# Patient Record
Sex: Male | Born: 1940 | Race: White | Hispanic: No | Marital: Married | Smoking: Former smoker
Health system: Southern US, Community
[De-identification: ages and names within clinical notes are randomized; demographics above are authoritative.]

## PROBLEM LIST (undated history)

## (undated) DIAGNOSIS — R12 Heartburn: Secondary | ICD-10-CM

## (undated) DIAGNOSIS — S46211A Strain of muscle, fascia and tendon of other parts of biceps, right arm, initial encounter: Secondary | ICD-10-CM

## (undated) DIAGNOSIS — M1990 Unspecified osteoarthritis, unspecified site: Secondary | ICD-10-CM

## (undated) DIAGNOSIS — K219 Gastro-esophageal reflux disease without esophagitis: Secondary | ICD-10-CM

## (undated) DIAGNOSIS — I639 Cerebral infarction, unspecified: Secondary | ICD-10-CM

## (undated) DIAGNOSIS — N400 Enlarged prostate without lower urinary tract symptoms: Secondary | ICD-10-CM

## (undated) DIAGNOSIS — D7581 Myelofibrosis: Secondary | ICD-10-CM

## (undated) DIAGNOSIS — C439 Malignant melanoma of skin, unspecified: Secondary | ICD-10-CM

## (undated) DIAGNOSIS — E039 Hypothyroidism, unspecified: Secondary | ICD-10-CM

## (undated) DIAGNOSIS — E785 Hyperlipidemia, unspecified: Secondary | ICD-10-CM

## (undated) DIAGNOSIS — U071 COVID-19: Secondary | ICD-10-CM

## (undated) DIAGNOSIS — Z8669 Personal history of other diseases of the nervous system and sense organs: Secondary | ICD-10-CM

## (undated) DIAGNOSIS — I129 Hypertensive chronic kidney disease with stage 1 through stage 4 chronic kidney disease, or unspecified chronic kidney disease: Secondary | ICD-10-CM

## (undated) DIAGNOSIS — S46811A Strain of other muscles, fascia and tendons at shoulder and upper arm level, right arm, initial encounter: Secondary | ICD-10-CM

## (undated) DIAGNOSIS — Z87442 Personal history of urinary calculi: Secondary | ICD-10-CM

## (undated) DIAGNOSIS — I10 Essential (primary) hypertension: Secondary | ICD-10-CM

## (undated) HISTORY — DX: Hypertensive chronic kidney disease with stage 1 through stage 4 chronic kidney disease, or unspecified chronic kidney disease: I12.9

## (undated) HISTORY — DX: Heartburn: R12

## (undated) HISTORY — DX: Hyperlipidemia, unspecified: E78.5

## (undated) HISTORY — PX: VARICOSE VEIN SURGERY: SHX832

## (undated) HISTORY — PX: COLONOSCOPY: SHX174

## (undated) HISTORY — PX: BACK SURGERY: SHX140

## (undated) HISTORY — DX: Personal history of other diseases of the nervous system and sense organs: Z86.69

## (undated) HISTORY — PX: EYE SURGERY: SHX253

## (undated) HISTORY — DX: Myelofibrosis: D75.81

---

## 2004-04-16 ENCOUNTER — Ambulatory Visit: Payer: Self-pay | Admitting: General Surgery

## 2005-08-09 ENCOUNTER — Ambulatory Visit: Payer: Self-pay | Admitting: Internal Medicine

## 2006-03-23 ENCOUNTER — Ambulatory Visit: Payer: Self-pay | Admitting: Specialist

## 2006-04-11 ENCOUNTER — Ambulatory Visit: Payer: Self-pay | Admitting: Pain Medicine

## 2006-10-03 ENCOUNTER — Ambulatory Visit: Payer: Self-pay | Admitting: Internal Medicine

## 2007-07-25 ENCOUNTER — Ambulatory Visit: Payer: Self-pay | Admitting: Specialist

## 2007-11-05 DIAGNOSIS — I639 Cerebral infarction, unspecified: Secondary | ICD-10-CM

## 2007-11-05 HISTORY — DX: Cerebral infarction, unspecified: I63.9

## 2007-11-19 ENCOUNTER — Emergency Department: Payer: Self-pay | Admitting: Emergency Medicine

## 2007-11-20 ENCOUNTER — Encounter (INDEPENDENT_AMBULATORY_CARE_PROVIDER_SITE_OTHER): Payer: Self-pay | Admitting: Pediatrics

## 2007-11-20 ENCOUNTER — Inpatient Hospital Stay (HOSPITAL_COMMUNITY): Admission: EM | Admit: 2007-11-20 | Discharge: 2007-11-21 | Payer: Self-pay | Admitting: Pediatrics

## 2009-06-06 LAB — HM COLONOSCOPY

## 2009-07-02 ENCOUNTER — Ambulatory Visit: Payer: Self-pay | Admitting: Internal Medicine

## 2010-07-21 ENCOUNTER — Encounter: Payer: Self-pay | Admitting: Vascular Surgery

## 2010-10-19 NOTE — H&P (Signed)
[name], [name]                 ACCOUNT NO.:  [id number]   MEDICAL RECORD NO.:  [id number]          PATIENT TYPE:  INP   LOCATION:  3109                         FACILITY:  [facility]   PHYSICIAN:  [name], M.D.DATE OF BIRTH:  June 03, 1941   DATE OF ADMISSION:  11/20/2007  DATE OF DISCHARGE:                              HISTORY & PHYSICAL   CHIEF COMPLAINT:  Left-sided weakness, clumsiness, and dysarthria.   HISTORY OF PRESENT CONDITION:  The patient is a 70 year old married  gentleman who had worked all day.  He came home, eating pizza drinking 2  mixed drinks.  He around 11 o'clock, he stated that he felt nauseated.  He went to the kitchen and had a syncopal episode.  He fell striking his  head against one of the porcelain knobs on his cabinets.   He was out for brief period of time.  When he awakened, his speech was  slurred.  He had left facial droop.  On arrival to the hospital,  he had  left-sided weakness, clumsiness, and slurred speech.  The ER doctor said  that he was having trouble getting his words out, but I suspect that  just was dysarthria.  He had no visual problems and no sensory problems.   NIH stroke scale was estimated 14.  Going over carefully, I suspect that  was closer to 11.   The patient's alcohol level was 128.  Calls were made to the [facility] which  had no beds and [facility] which advised not to give him tPA.  Even  though, he otherwise is a candidate because of his alcohol use in the  fall that he had.   I was called and discussed this with the ER doctor.  He stated that  there was no evidence of bruising on the patient's head.  There was some  concern about the alcohol.  Given the patient had focal neurologic  deficits rather than generalized, I felt that tPA was warranted for what  appeared to be a right posterior limb of the internal capsule or  possibly brainstem stroke.   TPA, the patient was last known normal approximately 2300 hours at  615.  TPA was a given as a bolus at 1:55 hours on November 20, 2007.  The patient  was then given the rest of the drip and was transported by [facility] to  [facility].  He arrived around 3:46.  I began to examine him  around 3:50.  I reviewed the CT scan prior to that and found evidence of  low density area that was remote in the left posterior temporal region  suggesting a possible prior stroke.  However, there is no evidence of  acute stroke.  There is no evidence of hemorrhage either into the brain  or into the scalp.   PAST MEDICAL HISTORY:  Remarkable for hypertension, gastritis, or peptic  ulcer disease.  He has had lumbar spondylosis and varicose veins.   PAST SURGICAL HISTORY:  Lumbar laminectomy and veins dripping.   CURRENT MEDICATIONS:  Aspirin, Azor, (? spelling), Protonix, and  multivitamin.  DRUG ALLERGIES:  None known.   FAMILY HISTORY:  Mother died of an intestinal obstruction at age 15.  Father died in his 63s of myocardial infarction.   SOCIAL HISTORY:  The patient spent several years in [employer] in  learning various skills and is a Administrator, Civil Service, ventilation,  air-conditioning and an Midwife.  He is married.  He quit smoking  years ago.  Started smoking in his teens and never smoked more than  about a half pack per day.  He occasionally sneaks one.  He drinks  alcohol several times a week.  He does not use drugs.   REVIEW OF SYSTEMS:  NEUROLOGIC:  Remarkable for sudden onset of  neurologic symptoms as described above.  CARDIOVASCULAR: Positive for  hypertension.  No myocardial infarction.  Pulmonary: No COPD.  GI:  Peptic ulcer disease versus gastritis.  GU: Negative.  MUSCULOSKELETAL:  Lumbar and small joint osteoarthritis.  ENDOCRINE:  Negative.  REPRODUCTIVE:  Negative.  HEMATOLOGIC AND LYMPHATIC:  No anemia,  bruisability.  ALLERGIC/IMMUNOLOGIC:  Negative.  NEUROPSYCHIATRIC:  No  depression.   PHYSICAL EXAMINATION:  VITAL SIGNS:   Temperature 93, blood pressure  133/79, resting pulse 73, respirations 15, oxygen saturation 100%,  height 5 feet 11 inches, and weight 200 pounds.  HEAD, EYES, EARS, NOSE AND THROAT: No signs of infection.  NECK: No bruits.  LUNGS: Clear.  HEART: No murmurs.  Regular rhythm.  ABDOMEN: Bowel sounds normal.  Protuberant hepatosplenomegaly.  SKIN:  Normal.  NEUROLOGIC:  The patient is awake and alert without dysphasia,  dyspraxia.  Cranial Nerves:  Round and reactive pupils.  Visual fields  full.  Extraocular movements full and conjugate.  Fundi normal. Left  central seventh midline tongue and uvula.  Air conduction greater than  bone conduction.  Motor examination normal strength, tone, and mass.  Good fine motor movements.  No drift.  The fine motor movements is  limited only by his osteoarthritis.  Sensation intact to primary  modalities, also to double simultaneous stimuli.  Primary modalities  included pinprick, proprioception, and vibration.  He had good  stereognosis.  Cerebellar:  Good finger-to-nose rapid movements.  Gait  was not tested.  Deep tendon reflexes were slightly more prominent on  the left side the left than the right.  He had bilateral flexor plantar  responses.   IMPRESSION:  1. Non hemorrhagic infarction, right brain, the mechanism is unknown      but the sudden onset suggest to me that was embolic 434.11.  2. Hypertension 404.10.  3. Alcohol abuse.  4. S/P tPA administration with exscellent response.   PLAN:  2D echocardiogram, carotid Doppler, transcranial Doppler, MRI,  MRA intracranial, laboratories including hemoglobin A1c, fasting lipid  panel, and serum homocysteine on November 21, 2007.  We will place him on  aspirin at 0300 hours.  His NIH stroke scale at this time is 1.  He has  made an excellent recovery.  He has passed his swallowing screen, will  be started on heart-healthy diet.  We will obtain his medications from  home and given to him.  Discuss  this with the patient and his family,  also nursing.  S/p      Christian Artis. Sharene Skeans, M.D.  Electronically Signed    [facility]/MEDQ  D:  11/20/2007  T:  11/20/2007  Job:  [id number]

## 2010-10-19 NOTE — Discharge Summary (Signed)
NAMEPURL, CLAYTOR                 ACCOUNT NO.:  [id number]   MEDICAL RECORD NO.:  [id number]          PATIENT TYPE:  INP   LOCATION:  3109                         FACILITY:  [facility]   PHYSICIAN:  Pramod P. Pearlean Brownie, MD    DATE OF BIRTH:  Apr 11, 1941   DATE OF ADMISSION:  11/20/2007  DATE OF DISCHARGE:  11/21/2007                               DISCHARGE SUMMARY   DIAGNOSES AT TIME OF DISCHARGE:  1. Right corona radiata infarct, positive on FLAIR, driving while      intoxicated negative, hyperdensity does correspond with left      hemiparesis and slurred stage.  The patient is status post full-      dose intravenous tissue plasminogen activator, accepted this      treatmentship from .  2. Hypertension.  3. Sporadic alcohol use.  4. Lumbar laminectomy.  5. History of vein stripping.   MEDICATIONS AT TIME OF DISCHARGE:  1. Hyzaar 5/20 mg a day.  2. Flomax 0.4 mg nightly.  3. Protonix 40 mg a day.  4. Multivitamin 1 a day.  5. Ferro-Sequels 1 a day.  6. Plavix 75 mg a day.   STUDIES PERFORMED:  1. CT of the brain at outside hospital showed no acute abnormality.  2. MRI of the brain shows negative DWI.  There is a hyperdensity and      corona radiata on the right on the FLAIR segment.  3. MRA of the brain shows mild intracranial atherosclerotic-type      changes.  4. Followup CT after 24 hours after tPA shows no acute abnormality.  5. Carotid Doppler shows no ICA stenosis.  6. 2D echocardiogram shows EF of 55%-65% with no obvious source of      embolus.  7. Transcranial Dopplers ordered.  No mention of completion in chart.  8. EKG not present in chart from [facility].  9. EKG from __________ shows sinus rhythm with first-degree AV block.   LABORATORY STUDIES:  Cholesterol 154, triglycerides 314, HDL 24, and LDL  87.  Hemoglobin A1c drawn, results are pending.  Homocysteine drawn,  results are pending.  Laboratory studies at a outside hospital shows  chemistry with  creatinine of 0.74, potassium 3.4, chloride 108, and  calcium 8.3, otherwise normal.  His alcohol level was 128.  CBC with  hemoglobin 12.0, hematocrit 34.3, white blood cells 3.79, and platelets  of 453.  INR is 1.0.   HISTORY OF PRESENT ILLNESS:  [name] is a 70 year old right-  handed Caucasian male who had worked all day.  He came home, ate pizza,  and drank 2 mixed drinks.  Around 11 p.m., he states he felt nauseated.  He went to the kitchen and had syncopal episode.  He fell striking his  head against the porcelain from his cabinet.  He was out for a brief  period of time.  When he awakens, his speech was slurred and had left  fascial droop.  On arrival to the hospital, he had left-sided weakness,  clumsiness, and slurred speech.  The ER doctor said that he was having  trouble getting his words out and suspect that he was just dysarthric.  He had no visual problems and no sensory problems.  NIH stroke scale at  [facility] was estimated to be 14.  His alcohol level was 128.  Calls were  made to [facility] which had no dibs and [facility], which advised not to  give tPA.  I called Dr. Sharene Skeans and discussed their opinion.  There was  no evidence of bruising on the patient.  He was recently intoxicated.  He had psychoneurologic deficit other than general and Dr. Sharene Skeans  thought tPA was warranted for right posterior limb of internal capsule  or possibly right stem stroke.  The patient was last known to be normal  at 11 p.m. on November 19, 2007.  He was given tPA at [facility] and  transported back here to [facility].  He was admitted to the  neuro intensive care for further evaluation.   HOSPITAL COURSE:  MRI had a negative DWI, however, FLAIR was positive  for right corona radiata infarct that is corresponding to his stroke  symptoms of left hemiparesis and slurred speech.  He was found to have  vascular risk factors of hypertension related to alcohol.  His NIH   stroke scale while in hospital was reported at 9, most likely a 11 down  to 1 at the time of discharge.  He has no outpatient speech or  occupational needs, but we will get physical therapy.  He is asked to  followup with his primary care physician and to followup risk factors.  Blood pressure has been under good control in the hospital.  The patient  was placed on aspirin for secondary stroke prevention.  He has been  advised to decrease alcohol use and quit smoking.  He will have  outpatient physical therapy and followup as an outpatient with Dr.  Pearlean Brownie.   CONDITION AT DISCHARGE:  The patient is alert and oriented x3.  No  aphasia.  No dysarthria.  Eye movements are full.  Has left facial  weakness.  Tongue is midline.  No drift in his arm, though he does have  decreased fine motor movement on his right arm compared to his left, he  has minimal left hip flexor weakness.  He has NIH stroke scale of 1.   DISCHARGE PLAN:  1. Discharge home with family.  2. Plavix for secondary stroke prevention.  3. Decrease alcohol use to no more than 3 drinks a day.  4. Outpatient occupational therapy.  5. Stop smoking.  6. Follow up with Dr. Delia Heady in 2-3 months.  7. Followup primary care physician in 1-2 weeks.      [name], N.P.    ______________________________  Sunny Schlein. Pearlean Brownie, MD    SB/MEDQ  D:  11/21/2007  T:  11/22/2007  Job:  [id number]   cc:   Wilmon Arms. Tsuei, M.D.  Dr. Delmer Islam

## 2011-03-03 LAB — LIPID PANEL
Cholesterol: 154
HDL: 24 — ABNORMAL LOW
LDL Cholesterol: 87
Total CHOL/HDL Ratio: 6.4
Triglycerides: 214 — ABNORMAL HIGH
VLDL: 43 — ABNORMAL HIGH

## 2011-03-03 LAB — HOMOCYSTEINE: Homocysteine: 7.9

## 2011-03-03 LAB — HEMOGLOBIN A1C
Hgb A1c MFr Bld: 5.9
Mean Plasma Glucose: 133

## 2011-08-08 ENCOUNTER — Encounter (INDEPENDENT_AMBULATORY_CARE_PROVIDER_SITE_OTHER): Payer: Medicare Other | Admitting: Ophthalmology

## 2011-08-08 DIAGNOSIS — H35419 Lattice degeneration of retina, unspecified eye: Secondary | ICD-10-CM

## 2011-08-08 DIAGNOSIS — H33009 Unspecified retinal detachment with retinal break, unspecified eye: Secondary | ICD-10-CM

## 2011-08-08 DIAGNOSIS — H43819 Vitreous degeneration, unspecified eye: Secondary | ICD-10-CM

## 2011-08-08 DIAGNOSIS — H33029 Retinal detachment with multiple breaks, unspecified eye: Secondary | ICD-10-CM | POA: Diagnosis not present

## 2011-08-08 DIAGNOSIS — H2510 Age-related nuclear cataract, unspecified eye: Secondary | ICD-10-CM

## 2011-08-08 DIAGNOSIS — H33001 Unspecified retinal detachment with retinal break, right eye: Secondary | ICD-10-CM | POA: Insufficient documentation

## 2011-08-08 DIAGNOSIS — H33309 Unspecified retinal break, unspecified eye: Secondary | ICD-10-CM

## 2011-08-08 NOTE — H&P (Signed)
[name] is an 71 y.o. male.   Chief Complaint: Loss of vision Right Eye HPI: 2 day history of vision loss right eye  PMH:  CVA 2009.  Negative for MI, Hep, HIV  No past surgical history on file.  No family history on file. Social History:  does not have a smoking history on file. He does not have any smokeless tobacco history on file. His alcohol and drug histories not on file.  Allergies: Allergies not on file  No current outpatient prescriptions on file as of 08/08/2011.   No current facility-administered medications on file as of 08/08/2011.    Review of systems otherwise negative  There were no vitals taken for this visit.  Physical exam: Mental status: oriented x3. Eyes: See eye exam associated with this surgery date and scanned in by scanning center Ears, Nose, Throat: within normal limits Neck: Within Normal limits General: within normal limits Chest: Within normal limits Breast: deferred Heart: Within normal limits Abdomen: Within normal limits GU: deferred Extremities: within normal limits Skin: within normal limits  Assessment/Plan Rhegmatogenous Retinal detachment Right eye Plan: To [facility] for Scleral buckle, laser and gas injection on Aug 11, 2011.  Sherrie George 08/08/2011, 5:23 PM

## 2011-08-10 ENCOUNTER — Encounter (HOSPITAL_COMMUNITY): Payer: Self-pay

## 2011-08-10 MED ORDER — PHENYLEPHRINE HCL 2.5 % OP SOLN
1.0000 [drp] | OPHTHALMIC | Status: AC | PRN
  Administered 2011-08-11 (×3): 1 [drp] via OPHTHALMIC
  Filled 2011-08-10: qty 3

## 2011-08-10 MED ORDER — CYCLOPENTOLATE HCL 1 % OP SOLN
1.0000 [drp] | OPHTHALMIC | Status: AC | PRN
  Administered 2011-08-11 (×3): 1 [drp] via OPHTHALMIC
  Filled 2011-08-10: qty 2

## 2011-08-10 MED ORDER — TROPICAMIDE 1 % OP SOLN
1.0000 [drp] | OPHTHALMIC | Status: AC | PRN
  Administered 2011-08-11 (×3): 1 [drp] via OPHTHALMIC
  Filled 2011-08-10: qty 3

## 2011-08-10 MED ORDER — CEFAZOLIN SODIUM-DEXTROSE 2-3 GM-% IV SOLR
2.0000 g | INTRAVENOUS | Status: AC
  Administered 2011-08-11: 2 g via INTRAVENOUS
  Filled 2011-08-10: qty 50

## 2011-08-10 MED ORDER — GATIFLOXACIN 0.5 % OP SOLN
1.0000 [drp] | OPHTHALMIC | Status: AC | PRN
  Administered 2011-08-11 (×3): 1 [drp] via OPHTHALMIC
  Filled 2011-08-10: qty 2.5

## 2011-08-11 ENCOUNTER — Other Ambulatory Visit (HOSPITAL_COMMUNITY): Payer: Self-pay | Admitting: Ophthalmology

## 2011-08-11 ENCOUNTER — Encounter (HOSPITAL_COMMUNITY)
Admission: RE | Disposition: A | Discharge status: Home / Self Care | Payer: Self-pay | Source: Ambulatory Visit | Attending: Ophthalmology

## 2011-08-11 ENCOUNTER — Ambulatory Visit (HOSPITAL_COMMUNITY): Payer: Medicare Other

## 2011-08-11 ENCOUNTER — Ambulatory Visit (HOSPITAL_COMMUNITY): Payer: Medicare Other | Admitting: Anesthesiology

## 2011-08-11 ENCOUNTER — Ambulatory Visit (HOSPITAL_COMMUNITY)
Admission: RE | Admit: 2011-08-11 | Discharge: 2011-08-12 | Disposition: A | Discharge status: Home / Self Care | Payer: Medicare Other | Source: Ambulatory Visit | Attending: Ophthalmology | Admitting: Ophthalmology

## 2011-08-11 ENCOUNTER — Encounter (HOSPITAL_COMMUNITY): Payer: Self-pay | Admitting: Anesthesiology

## 2011-08-11 ENCOUNTER — Encounter (HOSPITAL_COMMUNITY): Payer: Self-pay | Admitting: *Deleted

## 2011-08-11 ENCOUNTER — Other Ambulatory Visit: Payer: Self-pay

## 2011-08-11 DIAGNOSIS — Z23 Encounter for immunization: Secondary | ICD-10-CM | POA: Diagnosis not present

## 2011-08-11 DIAGNOSIS — Z01811 Encounter for preprocedural respiratory examination: Secondary | ICD-10-CM | POA: Diagnosis not present

## 2011-08-11 DIAGNOSIS — K219 Gastro-esophageal reflux disease without esophagitis: Secondary | ICD-10-CM | POA: Diagnosis not present

## 2011-08-11 DIAGNOSIS — H33009 Unspecified retinal detachment with retinal break, unspecified eye: Secondary | ICD-10-CM | POA: Insufficient documentation

## 2011-08-11 DIAGNOSIS — I10 Essential (primary) hypertension: Secondary | ICD-10-CM | POA: Diagnosis not present

## 2011-08-11 DIAGNOSIS — H33001 Unspecified retinal detachment with retinal break, right eye: Secondary | ICD-10-CM

## 2011-08-11 HISTORY — DX: Unspecified osteoarthritis, unspecified site: M19.90

## 2011-08-11 HISTORY — DX: Benign prostatic hyperplasia without lower urinary tract symptoms: N40.0

## 2011-08-11 HISTORY — PX: SCLERAL BUCKLE: SHX5340

## 2011-08-11 HISTORY — DX: Cerebral infarction, unspecified: I63.9

## 2011-08-11 HISTORY — PX: GAS INSERTION: SHX5336

## 2011-08-11 HISTORY — DX: Essential (primary) hypertension: I10

## 2011-08-11 HISTORY — DX: Gastro-esophageal reflux disease without esophagitis: K21.9

## 2011-08-11 LAB — CBC
HCT: 31.7 % — ABNORMAL LOW (ref 39.0–52.0)
Hemoglobin: 10.6 g/dL — ABNORMAL LOW (ref 13.0–17.0)
MCH: 29.7 pg (ref 26.0–34.0)
MCHC: 33.4 g/dL (ref 30.0–36.0)
MCV: 88.8 fL (ref 78.0–100.0)
Platelets: 294 10*3/uL (ref 150–400)
RBC: 3.57 MIL/uL — ABNORMAL LOW (ref 4.22–5.81)
RDW: 17.2 % — ABNORMAL HIGH (ref 11.5–15.5)
WBC: 3.2 10*3/uL — ABNORMAL LOW (ref 4.0–10.5)

## 2011-08-11 LAB — BASIC METABOLIC PANEL
BUN: 19 mg/dL (ref 6–23)
CO2: 24 mEq/L (ref 19–32)
Calcium: 9.1 mg/dL (ref 8.4–10.5)
Chloride: 110 mEq/L (ref 96–112)
Creatinine, Ser: 0.89 mg/dL (ref 0.50–1.35)
GFR calc Af Amer: >90 mL/min (ref >90)
GFR calc non Af Amer: 84 mL/min — ABNORMAL LOW (ref >90)
Glucose, Bld: 109 mg/dL — ABNORMAL HIGH (ref 70–99)
Potassium: 5.1 mEq/L (ref 3.5–5.1)
Sodium: 145 mEq/L (ref 135–145)

## 2011-08-11 LAB — SURGICAL PCR SCREEN
MRSA, PCR: NEGATIVE
Staphylococcus aureus: NEGATIVE

## 2011-08-11 SURGERY — SCLERAL BUCKLE
Anesthesia: General | Site: Eye | Laterality: Right | Wound class: Clean

## 2011-08-11 MED ORDER — TEMAZEPAM 15 MG PO CAPS: 15.0000 mg | ORAL_CAPSULE | Freq: Every evening | ORAL | Status: DC | PRN

## 2011-08-11 MED ORDER — DEXAMETHASONE SODIUM PHOSPHATE 10 MG/ML IJ SOLN
INTRAMUSCULAR | Status: DC | PRN
  Administered 2011-08-11: 10 mg

## 2011-08-11 MED ORDER — SODIUM CHLORIDE 0.9 % IJ SOLN
INTRAMUSCULAR | Status: DC | PRN
  Administered 2011-08-11: 15:00:00

## 2011-08-11 MED ORDER — PREDNISOLONE ACETATE 1 % OP SUSP
1.0000 [drp] | Freq: Four times a day (QID) | OPHTHALMIC | Status: DC
  Filled 2011-08-11: qty 1

## 2011-08-11 MED ORDER — LACTATED RINGERS IV SOLN: INTRAVENOUS | Status: DC

## 2011-08-11 MED ORDER — PHENYLEPHRINE HCL 10 MG/ML IJ SOLN
INTRAMUSCULAR | Status: DC | PRN
  Administered 2011-08-11 (×2): 40 ug via INTRAVENOUS

## 2011-08-11 MED ORDER — BACITRACIN-POLYMYXIN B 500-10000 UNIT/GM OP OINT
TOPICAL_OINTMENT | OPHTHALMIC | Status: DC | PRN
  Administered 2011-08-11: 1 via OPHTHALMIC

## 2011-08-11 MED ORDER — GLYCOPYRROLATE 0.2 MG/ML IJ SOLN
INTRAMUSCULAR | Status: DC | PRN
  Administered 2011-08-11: 0.6 mg via INTRAVENOUS

## 2011-08-11 MED ORDER — HYDROMORPHONE HCL PF 1 MG/ML IJ SOLN
0.2500 mg | INTRAMUSCULAR | Status: DC | PRN
  Administered 2011-08-11 (×2): 0.5 mg via INTRAVENOUS

## 2011-08-11 MED ORDER — ROCURONIUM BROMIDE 100 MG/10ML IV SOLN
INTRAVENOUS | Status: DC | PRN
  Administered 2011-08-11: 50 mg via INTRAVENOUS

## 2011-08-11 MED ORDER — ATROPINE SULFATE 1 % OP SOLN
OPHTHALMIC | Status: DC | PRN
  Administered 2011-08-11: 1 [drp] via OPHTHALMIC

## 2011-08-11 MED ORDER — TETRACAINE HCL 0.5 % OP SOLN
2.0000 [drp] | Freq: Once | OPHTHALMIC | Status: DC
  Filled 2011-08-11: qty 2

## 2011-08-11 MED ORDER — LIDOCAINE HCL 4 % MT SOLN
OROMUCOSAL | Status: DC | PRN
  Administered 2011-08-11: 4 mL via TOPICAL

## 2011-08-11 MED ORDER — PROPOFOL 10 MG/ML IV EMUL
INTRAVENOUS | Status: DC | PRN
  Administered 2011-08-11: 200 mg via INTRAVENOUS

## 2011-08-11 MED ORDER — SODIUM CHLORIDE 0.45 % IV SOLN
INTRAVENOUS | Status: DC
  Administered 2011-08-11: 20:00:00 via INTRAVENOUS

## 2011-08-11 MED ORDER — ACETAZOLAMIDE SODIUM 500 MG IJ SOLR
500.0000 mg | Freq: Once | INTRAMUSCULAR | Status: AC
  Administered 2011-08-12: 500 mg via INTRAVENOUS
  Filled 2011-08-11: qty 500

## 2011-08-11 MED ORDER — ONDANSETRON HCL 4 MG/2ML IJ SOLN
4.0000 mg | Freq: Four times a day (QID) | INTRAMUSCULAR | Status: DC | PRN
  Administered 2011-08-11: 4 mg via INTRAVENOUS

## 2011-08-11 MED ORDER — GATIFLOXACIN 0.5 % OP SOLN
1.0000 [drp] | Freq: Four times a day (QID) | OPHTHALMIC | Status: DC
  Filled 2011-08-11: qty 2.5

## 2011-08-11 MED ORDER — SODIUM CHLORIDE 0.9 % IV SOLN
INTRAVENOUS | Status: DC
  Administered 2011-08-11: 14:00:00 via INTRAVENOUS

## 2011-08-11 MED ORDER — ONDANSETRON HCL 4 MG/2ML IJ SOLN
INTRAMUSCULAR | Status: AC
  Filled 2011-08-11: qty 2

## 2011-08-11 MED ORDER — EPHEDRINE SULFATE 50 MG/ML IJ SOLN
INTRAMUSCULAR | Status: DC | PRN
  Administered 2011-08-11: 5 mg via INTRAVENOUS

## 2011-08-11 MED ORDER — OXYCODONE-ACETAMINOPHEN 5-325 MG PO TABS
1.0000 | ORAL_TABLET | ORAL | Status: DC | PRN
Start: 2011-08-11 — End: 2011-08-12

## 2011-08-11 MED ORDER — BRIMONIDINE TARTRATE 0.2 % OP SOLN
1.0000 [drp] | Freq: Two times a day (BID) | OPHTHALMIC | Status: DC
  Filled 2011-08-11: qty 5

## 2011-08-11 MED ORDER — MAGNESIUM HYDROXIDE 400 MG/5ML PO SUSP: 15.0000 mL | Freq: Four times a day (QID) | ORAL | Status: DC | PRN

## 2011-08-11 MED ORDER — NEOSTIGMINE METHYLSULFATE 1 MG/ML IJ SOLN
INTRAMUSCULAR | Status: DC | PRN
  Administered 2011-08-11: 3 mg via INTRAVENOUS

## 2011-08-11 MED ORDER — SODIUM CHLORIDE 0.9 % IV SOLN
INTRAVENOUS | Status: DC | PRN
  Administered 2011-08-11 (×2): via INTRAVENOUS

## 2011-08-11 MED ORDER — DROPERIDOL 2.5 MG/ML IJ SOLN: 0.6250 mg | INTRAMUSCULAR | Status: DC | PRN

## 2011-08-11 MED ORDER — ONDANSETRON HCL 4 MG/2ML IJ SOLN
INTRAMUSCULAR | Status: DC | PRN
  Administered 2011-08-11: 4 mg via INTRAVENOUS

## 2011-08-11 MED ORDER — BACITRACIN-POLYMYXIN B 500-10000 UNIT/GM OP OINT: TOPICAL_OINTMENT | Freq: Two times a day (BID) | OPHTHALMIC | Status: AC

## 2011-08-11 MED ORDER — HEMOSTATIC AGENTS (NO CHARGE) OPTIME
TOPICAL | Status: DC | PRN
  Administered 2011-08-11: 1 via TOPICAL

## 2011-08-11 MED ORDER — MORPHINE SULFATE 2 MG/ML IJ SOLN
1.0000 mg | INTRAMUSCULAR | Status: DC | PRN
  Administered 2011-08-11: 1 mg via INTRAVENOUS
  Filled 2011-08-11: qty 1

## 2011-08-11 MED ORDER — FENTANYL CITRATE 0.05 MG/ML IJ SOLN
INTRAMUSCULAR | Status: DC | PRN
  Administered 2011-08-11: 50 ug via INTRAVENOUS
  Administered 2011-08-11: 100 ug via INTRAVENOUS

## 2011-08-11 MED ORDER — BUPIVACAINE HCL 0.75 % IJ SOLN
INTRAMUSCULAR | Status: DC | PRN
  Administered 2011-08-11: 10 mL

## 2011-08-11 MED ORDER — MIDAZOLAM HCL 5 MG/5ML IJ SOLN
INTRAMUSCULAR | Status: DC | PRN
  Administered 2011-08-11: 1 mg via INTRAVENOUS

## 2011-08-11 MED ORDER — BSS IO SOLN
INTRAOCULAR | Status: DC | PRN
  Administered 2011-08-11: 15 mL via INTRAOCULAR

## 2011-08-11 MED ORDER — MUPIROCIN 2 % EX OINT
TOPICAL_OINTMENT | CUTANEOUS | Status: AC
  Administered 2011-08-11: 1 via NASAL
  Filled 2011-08-11: qty 22

## 2011-08-11 MED ORDER — LATANOPROST 0.005 % OP SOLN
1.0000 [drp] | Freq: Every day | OPHTHALMIC | Status: DC
  Filled 2011-08-11: qty 2.5

## 2011-08-11 MED ORDER — ACETAMINOPHEN 325 MG PO TABS
325.0000 mg | ORAL_TABLET | ORAL | Status: DC | PRN
  Administered 2011-08-12: 650 mg via ORAL
  Filled 2011-08-11: qty 2

## 2011-08-11 SURGICAL SUPPLY — 70 items
APPLICATOR DR MATTHEWS STRL (MISCELLANEOUS) ×12 IMPLANT
BAND SCLERAL BUCKLING TYPE 240 (Ophthalmic Related) ×2 IMPLANT
BLADE EYE CATARACT 19 1.4 BEAV (BLADE) ×2 IMPLANT
BLADE MVR KNIFE 19G (BLADE) IMPLANT
BLADE SURG 15 STRL LF DISP TIS (BLADE) IMPLANT
BLADE SURG 15 STRL SS (BLADE)
CANNULA ANT CHAM MAIN (OPHTHALMIC RELATED) IMPLANT
CANNULA DUAL BORE 23G (CANNULA) IMPLANT
CORDS BIPOLAR (ELECTRODE) IMPLANT
COTTONBALL LRG STERILE PKG (GAUZE/BANDAGES/DRESSINGS) ×6 IMPLANT
COVER SURGICAL LIGHT HANDLE (MISCELLANEOUS) ×2 IMPLANT
DRAPE OPHTHALMIC 77X100 STRL (CUSTOM PROCEDURE TRAY) ×2 IMPLANT
ERASER HMR WETFIELD 23G BP (MISCELLANEOUS) IMPLANT
EYE SHIELD UNIVERSAL CLEAR (GAUZE/BANDAGES/DRESSINGS) ×2 IMPLANT
FILTER BLUE MILLIPORE (MISCELLANEOUS) ×4 IMPLANT
FILTER STRAW FLUID ASPIR (MISCELLANEOUS) IMPLANT
GAS OPHTHALMIC (MISCELLANEOUS) ×2 IMPLANT
GLOVE SS BIOGEL STRL SZ 6.5 (GLOVE) ×2 IMPLANT
GLOVE SS BIOGEL STRL SZ 7 (GLOVE) ×1 IMPLANT
GLOVE SUPERSENSE BIOGEL SZ 6.5 (GLOVE) ×2
GLOVE SUPERSENSE BIOGEL SZ 7 (GLOVE) ×1
GLOVE SURG 8.5 LATEX PF (GLOVE) ×2 IMPLANT
GLOVE SURG SS PI 6.5 STRL IVOR (GLOVE) ×4 IMPLANT
GOWN STRL NON-REIN LRG LVL3 (GOWN DISPOSABLE) ×8 IMPLANT
ILLUMINATOR CHOW PICK 25GA (MISCELLANEOUS) IMPLANT
KIT PERFLUORON PROCEDURE 5ML (MISCELLANEOUS) IMPLANT
KIT ROOM TURNOVER OR (KITS) ×2 IMPLANT
KNIFE CRESCENT 1.75 EDGEAHEAD (BLADE) IMPLANT
KNIFE GRIESHABER SHARP 2.5MM (MISCELLANEOUS) ×8 IMPLANT
MARKER SKIN DUAL TIP RULER LAB (MISCELLANEOUS) IMPLANT
NEEDLE 18GX1X1/2 (RX/OR ONLY) (NEEDLE) ×2 IMPLANT
NEEDLE 25GX 5/8IN NON SAFETY (NEEDLE) IMPLANT
NEEDLE 27GAX1X1/2 (NEEDLE) IMPLANT
NEEDLE HYPO 30X.5 LL (NEEDLE) ×6 IMPLANT
NS IRRIG 1000ML POUR BTL (IV SOLUTION) ×2 IMPLANT
PACK VITRECTOMY CUSTOM (CUSTOM PROCEDURE TRAY) ×2 IMPLANT
PAD ARMBOARD 7.5X6 YLW CONV (MISCELLANEOUS) ×2 IMPLANT
PAD EYE OVAL STERILE LF (GAUZE/BANDAGES/DRESSINGS) ×2 IMPLANT
PAK VITRECTOMY PIK 25 GA (OPHTHALMIC RELATED) IMPLANT
PROBE DIRECTIONAL LASER (MISCELLANEOUS) IMPLANT
REPL STRA BRUSH NEEDLE (NEEDLE) IMPLANT
RESERVOIR BACK FLUSH (MISCELLANEOUS) IMPLANT
ROLLS DENTAL (MISCELLANEOUS) ×4 IMPLANT
SET FLUID INJECTOR (SET/KITS/TRAYS/PACK) IMPLANT
SET VGFI TUBING 8065808002 (SET/KITS/TRAYS/PACK) IMPLANT
SLEEVE SURGEON STRL (DRAPES) ×2 IMPLANT
SPEAR EYE SURG WECK-CEL (MISCELLANEOUS) ×4 IMPLANT
SPONGE GROOVED SILICONE 4X12X8 (Ophthalmic Related) ×2 IMPLANT
SPONGE SURGIFOAM ABS GEL 12-7 (HEMOSTASIS) ×2 IMPLANT
STOPCOCK 4 WAY LG BORE MALE ST (IV SETS) IMPLANT
SUT CHROMIC 7 0 TG140 8 (SUTURE) ×2 IMPLANT
SUT ETHILON 9 0 TG140 8 (SUTURE) IMPLANT
SUT MERSILENE 4 0 RV 2 (SUTURE) ×4 IMPLANT
SUT SILK 2 0 (SUTURE) ×1
SUT SILK 2-0 18XBRD TIE 12 (SUTURE) ×1 IMPLANT
SUT SILK 4 0 RB 1 (SUTURE) ×2 IMPLANT
SUT VICRYL 7 0 TG140 8 (SUTURE) IMPLANT
SYR 20CC LL (SYRINGE) ×2 IMPLANT
SYR 5ML LL (SYRINGE) IMPLANT
SYR BULB 3OZ (MISCELLANEOUS) ×2 IMPLANT
SYR TB 1ML LUER SLIP (SYRINGE) IMPLANT
SYRINGE 10CC LL (SYRINGE) ×2 IMPLANT
TAPE SURG TRANSPORE 1 IN (GAUZE/BANDAGES/DRESSINGS) ×1 IMPLANT
TAPE SURGICAL TRANSPORE 1 IN (GAUZE/BANDAGES/DRESSINGS) ×1
TIRE 11 SCLERAL TYPE 279 (Ophthalmic Related) ×2 IMPLANT
TOWEL OR 17X24 6PK STRL BLUE (TOWEL DISPOSABLE) ×6 IMPLANT
TUBING ART PRESS 12 MALE/MALE (MISCELLANEOUS) IMPLANT
VITREORETINAL VISCODISSEC (MISCELLANEOUS) IMPLANT
WATER STERILE IRR 1000ML POUR (IV SOLUTION) ×2 IMPLANT
WIPE INSTRUMENT VISIWIPE 73X73 (MISCELLANEOUS) ×2 IMPLANT

## 2011-08-11 NOTE — Transfer of Care (Signed)
Immediate Anesthesia Transfer of Care Note  Patient: [name]  Procedure(s) Performed: Procedure(s) (LRB): SCLERAL BUCKLE (Right) DIODE LASER APPLICATION (Right) INSERTION OF GAS (Right)  Patient Location: PACU  Anesthesia Type: General  Level of Consciousness: awake, alert , oriented and patient cooperative  Airway & Oxygen Therapy: Patient Spontanous Breathing and Patient connected to nasal cannula oxygen  Post-op Assessment: Report given to PACU RN, Post -op Vital signs reviewed and stable and Patient moving all extremities X 4  Post vital signs: Reviewed and stable  Complications: No apparent anesthesia complications

## 2011-08-11 NOTE — Brief Op Note (Signed)
Brief Operative note   Preoperative diagnosis:  Rhegmatogenous Retinal detachment right eye Postoperative diagnosis  * No Diagnosis Codes entered *  Procedures: Scleral buckle, laser and gas injection right eye  Surgeon:  Sherrie George, MD...  Assistant:  Rosalie Doctor SA   Anesthesia: General  Specimen: none  Estimated blood loss:  1cc  Complications: none  Patient sent to PACU in good condition  Composed by Sherrie George MD  Dictation number: [phone]

## 2011-08-11 NOTE — Anesthesia Postprocedure Evaluation (Signed)
Anesthesia Post Note  Patient: [name]  Procedure(s) Performed: Procedure(s) (LRB): SCLERAL BUCKLE (Right) DIODE LASER APPLICATION (Right) INSERTION OF GAS (Right)  Anesthesia type: general  Patient location: PACU  Post pain: Pain level controlled  Post assessment: Patient's Cardiovascular Status Stable  Last Vitals: There were no vitals filed for this visit.  Post vital signs: Reviewed and stable  Level of consciousness: sedated  Complications: No apparent anesthesia complications

## 2011-08-11 NOTE — Progress Notes (Signed)
Report to Kay RN as caregiver.  

## 2011-08-11 NOTE — Anesthesia Preprocedure Evaluation (Signed)
Anesthesia Evaluation  Patient identified by MRN, date of birth, ID band Patient awake    Reviewed: Allergy & Precautions, H&P , NPO status , Patient's Chart, lab work & pertinent test results  History of Anesthesia Complications Negative for: history of anesthetic complications  Airway Mallampati: II TM Distance: >3 FB Neck ROM: Full    Dental  (+) Edentulous Upper   Pulmonary neg pulmonary ROS,    Pulmonary exam normal       Cardiovascular hypertension, Pt. on medications     Neuro/Psych CVA, No Residual Symptoms    GI/Hepatic Neg liver ROS, GERD-  Controlled and Medicated,  Endo/Other  negative endocrine ROS  Renal/GU negative Renal ROS     Musculoskeletal   Abdominal   Peds  Hematology   Anesthesia Other Findings   Reproductive/Obstetrics                           Anesthesia Physical Anesthesia Plan  ASA: III  Anesthesia Plan: General   Post-op Pain Management:    Induction: Intravenous  Airway Management Planned: Oral ETT  Additional Equipment:   Intra-op Plan:   Post-operative Plan: Extubation in OR  Informed Consent: I have reviewed the patients History and Physical, chart, labs and discussed the procedure including the risks, benefits and alternatives for the proposed anesthesia with the patient or authorized representative who has indicated his/her understanding and acceptance.   Dental advisory given  Plan Discussed with: Surgeon, Anesthesiologist and CRNA  Anesthesia Plan Comments:         Anesthesia Quick Evaluation

## 2011-08-11 NOTE — Anesthesia Procedure Notes (Signed)
Procedure Name: Intubation Date/Time: 08/11/2011 2:53 PM Performed by: Elizbeth Squires Pre-anesthesia Checklist: Patient identified, Emergency Drugs available, Suction available and Patient being monitored Patient Re-evaluated:Patient Re-evaluated prior to inductionOxygen Delivery Method: Circle system utilized Preoxygenation: Pre-oxygenation with 100% oxygen Intubation Type: IV induction Ventilation: Mask ventilation without difficulty and Oral airway inserted - appropriate to patient size Laryngoscope Size: Mac and 3 Grade View: Grade I Tube type: Oral Tube size: 7.5 mm Number of attempts: 1 Airway Equipment and Method: Stylet Placement Confirmation: ETT inserted through vocal cords under direct vision,  positive ETCO2 and breath sounds checked- equal and bilateral Secured at: 23 cm Tube secured with: Tape Dental Injury: Teeth and Oropharynx as per pre-operative assessment

## 2011-08-11 NOTE — H&P (Signed)
I examined the patient today and there is no change in the medical status 

## 2011-08-12 ENCOUNTER — Encounter (HOSPITAL_COMMUNITY): Payer: Self-pay | Admitting: Ophthalmology

## 2011-08-12 MED ORDER — GATIFLOXACIN 0.5 % OP SOLN: 1.0000 [drp] | Freq: Four times a day (QID) | OPHTHALMIC | Status: DC

## 2011-08-12 MED ORDER — OXYCODONE-ACETAMINOPHEN 5-325 MG PO TABS: 1.0000 | ORAL_TABLET | ORAL | Status: AC | PRN

## 2011-08-12 MED ORDER — PNEUMOCOCCAL VAC POLYVALENT 25 MCG/0.5ML IJ INJ
0.5000 mL | INJECTION | Freq: Every day | INTRAMUSCULAR | Status: AC
  Administered 2011-08-12: 0.5 mL via INTRAMUSCULAR
  Filled 2011-08-12: qty 0.5

## 2011-08-12 MED ORDER — BACITRACIN-POLYMYXIN B 500-10000 UNIT/GM OP OINT: TOPICAL_OINTMENT | Freq: Three times a day (TID) | OPHTHALMIC | Status: DC

## 2011-08-12 MED ORDER — BRIMONIDINE TARTRATE 0.2 % OP SOLN: 1.0000 [drp] | Freq: Two times a day (BID) | OPHTHALMIC | Status: AC

## 2011-08-12 MED ORDER — BACITRACIN-POLYMYXIN B 500-10000 UNIT/GM OP OINT: TOPICAL_OINTMENT | Freq: Three times a day (TID) | OPHTHALMIC | Status: AC

## 2011-08-12 MED ORDER — LATANOPROST 0.005 % OP SOLN: 1.0000 [drp] | Freq: Every day | OPHTHALMIC | Status: AC

## 2011-08-12 MED ORDER — PREDNISOLONE ACETATE 1 % OP SUSP: 1.0000 [drp] | Freq: Four times a day (QID) | OPHTHALMIC | Status: AC

## 2011-08-12 NOTE — Progress Notes (Signed)
Discharge instructions reviewed with pt and pt's wife and prescription given.  Pt has eye bag with medications in room.  Pt and pt's wife verbalized understanding and had no questions.  Pt discharged in stable condition via wheelchair with wife.  Hector Shade [place]

## 2011-08-12 NOTE — Progress Notes (Signed)
08/12/2011, 6:38 AM  Mental Status:  Awake, Alert, Oriented  Anterior segment: Cornea  Clear    Anterior Chamber Clear    Lens:   Cataract  Intra Ocular Pressure 26 mmHg with Tonopen  Vitreous: Clear 30%gas bubble  Retina:  Attached Good laser reaction  Impression: Excellent result Retina attached Final Diagnosis: Active Problems: Rhegmatogenous Retinal detachment   Plan: start post operative eye drops.  Discharge to home.  Give post operative instructions  Sherrie George 08/12/2011, 6:38 AM

## 2011-08-12 NOTE — Op Note (Signed)
NAMEANGAD, NABERS                 ACCOUNT NO.:  [id number]  MEDICAL RECORD NO.:  [id number]  LOCATION:  5127                         FACILITY:  [facility]  PHYSICIAN:  Beulah Gandy. Ashley Royalty, M.D. DATE OF BIRTH:  12-24-1940  DATE OF PROCEDURE:  08/11/2011 DATE OF DISCHARGE:                              OPERATIVE REPORT   ADMISSION DIAGNOSIS:  Rhegmatogenous retinal detachment, right eye.  PROCEDURE:  Scleral buckle, right eye, gas injection, right eye, and panretinal photocoagulation, right eye.  SURGEON:  Beulah Gandy. Ashley Royalty, MD  ASSISTANT:  Rosalie Doctor, SA  ANESTHESIA:  General.  DETAILS:  Usual prep and drape, 360 degree limbal peritomy isolation of 4 rectus muscles on 2-0 silk, localization of break at 12 o'clock. Scleral dissection for 360 degrees to admit a #279 intrascleral implant. Diathermy placed in the bed.  Two sutures per quadrant for a total of 8 scleral sutures were placed in the scleral flaps.  The 279 implant was placed around the globe with a joint at 2 o'clock. A 240 band was placed around the globe with a 270 sleeve at 2 o'clock.  The buckle was adjusted and trimmed.  The band was adjusted and trimmed.  The perforation site was chosen at 10 o'clock in the posterior aspect of the bed.  A large amount of clear colorless subretinal fluid came forth in a controlled manner.  A 508 G radial segment was placed at 12 o'clock beneath the break.  C3F8 0.9-1.0 mL was injected through the pars plana into the vitreous cavity.  The C3F8 was 100%.  Indirect ophthalmoscopy showed the retina to be lying nicely in place on the scleral buckle and the break was well supported.  The indirect ophthalmoscope laser was moved into place, 649 burns were placed around the retinal periphery and around the retinal break.  Power was 500 mw, 1000 microns each, and 0.1 seconds each.  The buckle again was adjusted and trimmed.  The band was adjusted and trimmed.  The conjunctiva was reposited with 7-0  chromic suture.  Polymyxin and gentamicin were irrigated into [place] space and atropine solution was applied.  Marcaine was injected around the globe for postop pain.  Decadron 10 mg was injected into the lower subconjunctival space.  Polysporin ophthalmic ointment, a patch and shield were placed.  Closing pressure was 15 with a Barraquer tonometer. Complications None.  Duration 2 hours.  The patient was awakened and taken to recovery in satisfactory condition.     Beulah Gandy. Ashley Royalty, M.D.     JDM/MEDQ  D:  08/11/2011  T:  08/12/2011  Job:  [id number]

## 2011-08-12 NOTE — Discharge Summary (Signed)
Discharge summary not needed on [facility] patients per medical records. 

## 2011-08-18 ENCOUNTER — Encounter (INDEPENDENT_AMBULATORY_CARE_PROVIDER_SITE_OTHER): Payer: Medicare Other | Admitting: Ophthalmology

## 2011-08-18 DIAGNOSIS — H33309 Unspecified retinal break, unspecified eye: Secondary | ICD-10-CM

## 2011-08-18 DIAGNOSIS — H33009 Unspecified retinal detachment with retinal break, unspecified eye: Secondary | ICD-10-CM

## 2011-09-01 DIAGNOSIS — G245 Blepharospasm: Secondary | ICD-10-CM | POA: Diagnosis not present

## 2011-09-01 DIAGNOSIS — I63239 Cerebral infarction due to unspecified occlusion or stenosis of unspecified carotid arteries: Secondary | ICD-10-CM | POA: Diagnosis not present

## 2011-09-01 DIAGNOSIS — I6340 Cerebral infarction due to embolism of unspecified cerebral artery: Secondary | ICD-10-CM | POA: Diagnosis not present

## 2011-09-06 DIAGNOSIS — I6340 Cerebral infarction due to embolism of unspecified cerebral artery: Secondary | ICD-10-CM | POA: Diagnosis not present

## 2011-09-12 ENCOUNTER — Encounter (INDEPENDENT_AMBULATORY_CARE_PROVIDER_SITE_OTHER): Payer: Medicare Other | Admitting: Ophthalmology

## 2011-09-12 DIAGNOSIS — H33009 Unspecified retinal detachment with retinal break, unspecified eye: Secondary | ICD-10-CM

## 2011-10-20 NOTE — Progress Notes (Signed)
This encounter was created in error - please disregard.

## 2011-11-21 ENCOUNTER — Encounter (INDEPENDENT_AMBULATORY_CARE_PROVIDER_SITE_OTHER): Payer: Medicare Other | Admitting: Ophthalmology

## 2011-11-21 DIAGNOSIS — H43819 Vitreous degeneration, unspecified eye: Secondary | ICD-10-CM | POA: Diagnosis not present

## 2011-11-21 DIAGNOSIS — H33309 Unspecified retinal break, unspecified eye: Secondary | ICD-10-CM | POA: Diagnosis not present

## 2011-11-21 DIAGNOSIS — H2510 Age-related nuclear cataract, unspecified eye: Secondary | ICD-10-CM

## 2011-11-21 DIAGNOSIS — H33009 Unspecified retinal detachment with retinal break, unspecified eye: Secondary | ICD-10-CM | POA: Diagnosis not present

## 2011-11-28 DIAGNOSIS — H2510 Age-related nuclear cataract, unspecified eye: Secondary | ICD-10-CM | POA: Diagnosis not present

## 2011-12-27 DIAGNOSIS — H33009 Unspecified retinal detachment with retinal break, unspecified eye: Secondary | ICD-10-CM | POA: Diagnosis not present

## 2011-12-27 DIAGNOSIS — H2510 Age-related nuclear cataract, unspecified eye: Secondary | ICD-10-CM | POA: Diagnosis not present

## 2012-01-16 DIAGNOSIS — R42 Dizziness and giddiness: Secondary | ICD-10-CM | POA: Diagnosis not present

## 2012-01-16 DIAGNOSIS — I6789 Other cerebrovascular disease: Secondary | ICD-10-CM | POA: Diagnosis not present

## 2012-01-24 DIAGNOSIS — I4891 Unspecified atrial fibrillation: Secondary | ICD-10-CM | POA: Diagnosis not present

## 2012-02-27 DIAGNOSIS — H269 Unspecified cataract: Secondary | ICD-10-CM | POA: Diagnosis not present

## 2012-02-27 DIAGNOSIS — H2510 Age-related nuclear cataract, unspecified eye: Secondary | ICD-10-CM | POA: Diagnosis not present

## 2012-02-28 DIAGNOSIS — H2510 Age-related nuclear cataract, unspecified eye: Secondary | ICD-10-CM | POA: Diagnosis not present

## 2012-03-05 DIAGNOSIS — G245 Blepharospasm: Secondary | ICD-10-CM | POA: Diagnosis not present

## 2012-03-05 DIAGNOSIS — I63239 Cerebral infarction due to unspecified occlusion or stenosis of unspecified carotid arteries: Secondary | ICD-10-CM | POA: Diagnosis not present

## 2012-03-19 DIAGNOSIS — H269 Unspecified cataract: Secondary | ICD-10-CM | POA: Diagnosis not present

## 2012-03-19 DIAGNOSIS — H2510 Age-related nuclear cataract, unspecified eye: Secondary | ICD-10-CM | POA: Diagnosis not present

## 2012-03-20 DIAGNOSIS — Z23 Encounter for immunization: Secondary | ICD-10-CM | POA: Diagnosis not present

## 2012-04-04 DIAGNOSIS — H40059 Ocular hypertension, unspecified eye: Secondary | ICD-10-CM | POA: Diagnosis not present

## 2012-05-15 DIAGNOSIS — I10 Essential (primary) hypertension: Secondary | ICD-10-CM | POA: Diagnosis not present

## 2012-05-15 DIAGNOSIS — I6789 Other cerebrovascular disease: Secondary | ICD-10-CM | POA: Diagnosis not present

## 2012-05-15 DIAGNOSIS — I6990 Unspecified sequelae of unspecified cerebrovascular disease: Secondary | ICD-10-CM | POA: Diagnosis not present

## 2012-05-23 ENCOUNTER — Ambulatory Visit (INDEPENDENT_AMBULATORY_CARE_PROVIDER_SITE_OTHER): Payer: Medicare Other | Admitting: Ophthalmology

## 2012-05-23 DIAGNOSIS — H35039 Hypertensive retinopathy, unspecified eye: Secondary | ICD-10-CM

## 2012-05-23 DIAGNOSIS — H3530 Unspecified macular degeneration: Secondary | ICD-10-CM

## 2012-05-23 DIAGNOSIS — H43819 Vitreous degeneration, unspecified eye: Secondary | ICD-10-CM

## 2012-05-23 DIAGNOSIS — H33009 Unspecified retinal detachment with retinal break, unspecified eye: Secondary | ICD-10-CM

## 2012-05-23 DIAGNOSIS — H33309 Unspecified retinal break, unspecified eye: Secondary | ICD-10-CM

## 2012-05-23 DIAGNOSIS — I10 Essential (primary) hypertension: Secondary | ICD-10-CM

## 2012-08-14 DIAGNOSIS — H9209 Otalgia, unspecified ear: Secondary | ICD-10-CM | POA: Diagnosis not present

## 2012-08-14 DIAGNOSIS — I10 Essential (primary) hypertension: Secondary | ICD-10-CM | POA: Diagnosis not present

## 2012-08-14 DIAGNOSIS — I6789 Other cerebrovascular disease: Secondary | ICD-10-CM | POA: Diagnosis not present

## 2012-09-14 ENCOUNTER — Telehealth: Payer: Self-pay | Admitting: *Deleted

## 2012-09-14 NOTE — Telephone Encounter (Signed)
Have his primary M.D. address this and may be referred back if necessary

## 2012-09-14 NOTE — Telephone Encounter (Signed)
Patient called and stated he is having a lot of pain in his neck. The pain is mainly on the  his right of his neck, goes up right behind his ear without  inner ear pain. Patient states this pain has been going on for three weeks states its a dull pain which comes and goes. Patient has been taking ibuprofen which seems to help. Please  advise

## 2012-09-17 NOTE — Telephone Encounter (Signed)
I have made a [facility] appointment with you patient does not feel safe going to see anyone one else. He states his primary is a cardiologist and does not feel like he can handle his problem.

## 2012-09-18 ENCOUNTER — Ambulatory Visit: Payer: Self-pay | Admitting: Internal Medicine

## 2012-09-18 DIAGNOSIS — M47812 Spondylosis without myelopathy or radiculopathy, cervical region: Secondary | ICD-10-CM | POA: Diagnosis not present

## 2012-09-18 DIAGNOSIS — I6789 Other cerebrovascular disease: Secondary | ICD-10-CM | POA: Diagnosis not present

## 2012-09-18 DIAGNOSIS — I10 Essential (primary) hypertension: Secondary | ICD-10-CM | POA: Diagnosis not present

## 2012-09-18 DIAGNOSIS — S139XXA Sprain of joints and ligaments of unspecified parts of neck, initial encounter: Secondary | ICD-10-CM | POA: Diagnosis not present

## 2012-09-18 DIAGNOSIS — M5030 Other cervical disc degeneration, unspecified cervical region: Secondary | ICD-10-CM | POA: Diagnosis not present

## 2012-09-26 DIAGNOSIS — I10 Essential (primary) hypertension: Secondary | ICD-10-CM | POA: Diagnosis not present

## 2012-09-26 DIAGNOSIS — I6789 Other cerebrovascular disease: Secondary | ICD-10-CM | POA: Diagnosis not present

## 2012-09-26 DIAGNOSIS — M5412 Radiculopathy, cervical region: Secondary | ICD-10-CM | POA: Diagnosis not present

## 2012-09-26 DIAGNOSIS — M4712 Other spondylosis with myelopathy, cervical region: Secondary | ICD-10-CM | POA: Diagnosis not present

## 2012-09-26 NOTE — Telephone Encounter (Signed)
ok 

## 2012-09-27 ENCOUNTER — Inpatient Hospital Stay
Admission: RE | Admit: 2012-09-27 | Discharge: 2012-09-27 | Disposition: A | Discharge status: Home / Self Care | Payer: Self-pay | Source: Ambulatory Visit | Attending: Neurology | Admitting: Neurology

## 2012-09-27 ENCOUNTER — Encounter: Payer: Self-pay | Admitting: Neurology

## 2012-09-27 ENCOUNTER — Other Ambulatory Visit: Payer: Self-pay | Admitting: *Deleted

## 2012-09-27 ENCOUNTER — Ambulatory Visit (INDEPENDENT_AMBULATORY_CARE_PROVIDER_SITE_OTHER): Payer: Medicare Other | Admitting: Neurology

## 2012-09-27 VITALS — BP 124/74 | HR 62 | Temp 97.8°F | Ht 69.0 in | Wt 193.0 lb

## 2012-09-27 DIAGNOSIS — I6990 Unspecified sequelae of unspecified cerebrovascular disease: Secondary | ICD-10-CM

## 2012-09-27 DIAGNOSIS — M542 Cervicalgia: Secondary | ICD-10-CM

## 2012-09-27 NOTE — Patient Instructions (Signed)
He was advised to use local heat application for his neck pain and 2 daily next exercises. Continue to use Aleve up to 3 times a day as needed and try muscle relaxants. Check MRI scan of the neck and stay on Plavix for stroke prevention and maintain strict control of hypertension and lipids. Check followup carotid ultrasound study. Return for followup in 2 months with nurse practitioner.Marland Kitchen

## 2012-09-27 NOTE — Progress Notes (Signed)
[facility] [address] [place]. [place] [address] [phone]       OFFICE CONSULT NOTE  Christian. [name] Date of Birth:  1940-07-24 Medical Record Number:  [id number]   Referring MD:  Corky Downs, MD Reason for Referral:  Neck pain  HPI: [name] is a 72 year old pleasant Caucasian male who is had right-sided neck pain for the last 2 months. He states he woke up one day with right sided posterior neck pain with muscle spasm and tightness. He felt a crick in his neck. He had trouble moving his neck in either direction. He for a few days had pain radiating up behind his ear. He took Mobic initially which did not tolerate due to GI side effects. Leave seems to work better for him and takes the morning and can get by doing his work. During the late afternoon and evening times the pain and neck movements are bothersome. He denies any rectal pain going down his spine on his arm. He denies tingling numbness or weakness in his hands. He denies any gait or balance difficulties or bladder control problems. He had x-ray of the neck done Hawn/15/14 at [facility] which shows multilevel degenerative disc disease with right-sided neuroforaminal narrowing at C5-6. He denies lifting any heavy weights or any recent injury or previous neck problems.  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 Vascular risk factors of hypertension hyperlipidemia. He had an episode of intermittent transient left eye blepharospasm in 2013 which improved without treatment. He has been on Plavix which is tolerating well without bleeding, bruising or other side effects. He states his blood pressure and cholesterol have all been under good control. ROS:   14 system review of systems is positive for hypertension, hearing loss, easy bleeding, joint pain, neck pain, allergies, runny nose. PMH:   Past Medical History  Diagnosis Date  . Hypertension   . Kidney stones     hx of  . Prostate hypertrophy   . GERD (gastroesophageal reflux disease)   . Cancer     hx of melanoma  . Arthritis   . 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 Hyperlipidemia     Social History:  History   Social History  . Marital Status: Married    Spouse Name: N/A    Number of Children: 2  . Years of Education: N/A   Occupational History  . builder Other    community   Social History Main Topics  . Smoking status: Former Games developer  . Smokeless tobacco: Not on file  . Alcohol Use: No  . Drug Use: No  . Sexually Active:    Other Topics Concern  . Not on file   Social History Narrative   Pt lives at home with his family.   Caffeine Use- 2 cups daily    Medications:   Current Outpatient Prescriptions on File Prior to Visit  Medication Sig Dispense Refill  . amLODipine-olmesartan (AZOR) 10-40 MG per tablet Take 1 tablet by mouth daily.      . Calcium Carbonate (CALCIUM 600 PO) Take 600 mg by mouth 2 (two) times daily.      . clopidogrel (PLAVIX) 75 MG tablet Take 75 mg by mouth daily.      . Ginseng 100 MG TABS Take 200 mg by mouth daily.      Marland Kitchen omega-3 acid  ethyl esters (LOVAZA) 1 G capsule Take 1 g by mouth 2 (two) times daily.      . ranitidine (ZANTAC) 300 MG tablet Take 300 mg by mouth at bedtime.      . simvastatin (ZOCOR) 40 MG tablet Take 40 mg by mouth every evening.      . Tamsulosin HCl (FLOMAX) 0.4 MG CAPS Take 0.4 mg by mouth daily.      Marland Kitchen gatifloxacin (ZYMAXID) 0.5 % SOLN Place 1 drop into the right eye 4 (four) times daily.      . Multiple Vitamin (MULITIVITAMIN WITH MINERALS) TABS Take 1 tablet by mouth daily.       No current facility-administered medications on file prior to visit.    Allergies:  No Known Allergies  Physical Exam General: well developed, well nourished middle aged Caucasian male., seated, in no evident distress Head: head normocephalic  and atraumatic. Orohparynx benign Neck: supple with no carotid or supraclavicular bruits Cardiovascular: regular rate and rhythm, no murmurs Musculoskeletal: no deformity. Mild neck spasm Skin:  no rash/petichiae Vascular:  Normal pulses all extremities  Neurologic Exam Mental Status: Awake and fully alert. Oriented to place and time. Recent and remote memory intact. Attention span, concentration and fund of knowledge appropriate. Mood and affect appropriate.  Cranial Nerves: Fundoscopic exam reveals sharp disc margins. Pupils equal, briskly reactive to light. Extraocular movements full without nystagmus. Visual fields full to confrontation. Hearing intact. Facial sensation intact. Face, tongue, palate moves normally and symmetrically.  Motor: Normal bulk and tone. Normal strength in all tested extremity muscles. Sensory.: intact to tough and pinprick and vibratory.  Coordination: Rapid alternating movements normal in all extremities. Finger-to-nose and heel-to-shin performed accurately bilaterally.diminished fine finger movements on left. Orbits right over left upper extremity. Gait and Station: Arises from chair without difficulty. Stance is normal. Gait demonstrates normal stride length and balance . Able to heel, toe and tandem walk without difficulty.  Reflexes: 1+ and symmetric. Toes downgoing.     ASSESSMENT: 72 year old male with right-sided neck pain likely musculoskeletal due to  degenerative spine disease. 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 Vascular risk factors of hypertension and hyperlipidemia.    PLAN: I had a long discussion the patient and wife regarding his right-sided neck pain and have advised him to continue to use Aleeve but to increase the frequency to 3 times a day if necessary. Try the new muscle relaxant prescribed by his primary care physician. Local heat application as needed. I have given him next  in exercises to be done daily. Check MRI scan of the neck to evaluate his degenerative spine further. Continue Plavix for second stroke prevention strict control of hypertension with blood pressure goal below 130/90 and lipids with LDL cholesterol goal below 100 mg percent. Check followup carotid ultrasound study. Return for followup in 2 months or earlier if necessary

## 2012-10-01 ENCOUNTER — Ambulatory Visit
Admission: RE | Admit: 2012-10-01 | Discharge: 2012-10-01 | Disposition: A | Discharge status: Home / Self Care | Payer: Medicare Other | Source: Ambulatory Visit | Attending: Neurology | Admitting: Neurology

## 2012-10-01 DIAGNOSIS — M542 Cervicalgia: Secondary | ICD-10-CM

## 2012-10-02 ENCOUNTER — Inpatient Hospital Stay: Admission: RE | Admit: 2012-10-02 | Payer: Medicare Other | Source: Ambulatory Visit

## 2012-10-05 ENCOUNTER — Other Ambulatory Visit: Payer: Self-pay | Admitting: Neurology

## 2012-10-05 DIAGNOSIS — I6990 Unspecified sequelae of unspecified cerebrovascular disease: Secondary | ICD-10-CM

## 2012-10-11 ENCOUNTER — Ambulatory Visit (INDEPENDENT_AMBULATORY_CARE_PROVIDER_SITE_OTHER): Payer: Medicare Other

## 2012-10-11 DIAGNOSIS — I6990 Unspecified sequelae of unspecified cerebrovascular disease: Secondary | ICD-10-CM

## 2012-10-11 DIAGNOSIS — R269 Unspecified abnormalities of gait and mobility: Secondary | ICD-10-CM

## 2012-10-22 ENCOUNTER — Telehealth: Payer: Self-pay | Admitting: *Deleted

## 2012-10-22 NOTE — Telephone Encounter (Signed)
Informed of results  10/22/12

## 2012-10-22 NOTE — Telephone Encounter (Signed)
I called pt and let him know the results of MRI C-Spine.  (age degenerative changes no compression).  Pt can use heat, neck exercises, aleve TID as necessary.  Call pcp for muscle relaxant.  Will fax MRI report and notes to Dr. Corky Downs.

## 2012-11-22 DIAGNOSIS — I6990 Unspecified sequelae of unspecified cerebrovascular disease: Secondary | ICD-10-CM | POA: Diagnosis not present

## 2012-11-22 DIAGNOSIS — I10 Essential (primary) hypertension: Secondary | ICD-10-CM | POA: Diagnosis not present

## 2012-11-22 DIAGNOSIS — M5412 Radiculopathy, cervical region: Secondary | ICD-10-CM | POA: Diagnosis not present

## 2012-11-22 DIAGNOSIS — M47812 Spondylosis without myelopathy or radiculopathy, cervical region: Secondary | ICD-10-CM | POA: Diagnosis not present

## 2012-12-10 ENCOUNTER — Encounter: Payer: Self-pay | Admitting: Nurse Practitioner

## 2012-12-10 ENCOUNTER — Ambulatory Visit (INDEPENDENT_AMBULATORY_CARE_PROVIDER_SITE_OTHER): Payer: Medicare Other | Admitting: Nurse Practitioner

## 2012-12-10 VITALS — BP 109/69 | HR 63 | Temp 98.3°F | Ht 71.0 in | Wt 195.0 lb

## 2012-12-10 DIAGNOSIS — I6350 Cerebral infarction due to unspecified occlusion or stenosis of unspecified cerebral artery: Secondary | ICD-10-CM

## 2012-12-10 DIAGNOSIS — I639 Cerebral infarction, unspecified: Secondary | ICD-10-CM

## 2012-12-10 DIAGNOSIS — M542 Cervicalgia: Secondary | ICD-10-CM

## 2012-12-10 NOTE — Progress Notes (Signed)
[facility]  PATIENT: Jakevious Hollister DOB: 1940-12-23   HISTORY FROM: patient, chart REASON FOR VISIT: routine follow up  HISTORY OF PRESENT ILLNESS:  Mr Westfall is a 72 year old pleasant Caucasian male who has had right-sided neck pain for the last 2 months. He states he woke up one day with right sided posterior neck pain with muscle spasm and tightness. He felt a crick in his neck. He had trouble moving his neck in either direction. He for a few days had pain radiating up behind his ear. He took Mobic initially which did not tolerate due to GI side effects. Aleve seems to work better for him and if he takes it in the morning and can get by doing his work. During the late afternoon and evening times the pain and neck movements are bothersome. He denies any radicular pain going down his spine or his arm. He denies tingling numbness or weakness in his hands. He denies any gait or balance difficulties or bladder control problems. He had x-ray of the neck done on 10/23/12 at [facility] which shows multilevel degenerative disc disease with right-sided neuroforaminal narrowing at C5-6. He denies lifting any heavy weights or any recent injury or previous neck problems.   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 Vascular risk factors of hypertension and hyperlipidemia. 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 He has been on Plavix which is tolerating well without bleeding, bruising or other side effects. He states his blood pressure and cholesterol have all been under good control.  UPDATE 12/10/12:  Patient returns for followup of visit on 09/27/2012. Since that visit he had a MRI of his cervical spine without contrast and ultrasound carotid duplex bilateral. He continues to have right-sided neck pain that radiates up  behind his right ear in which he takes Aleve every 12 hours.  He reports that he still has transient left eye blepharospasm which seems to be worse when he is tired or anxious.  He continues to work as a Engineer, mining.  He does not know the last time his cholesterol was checked. He states that his blood pressure is under good control. He continues with Plavix 75 mg daily, tolerating well, without bleeding, bruising or other side effects.  REVIEW OF SYSTEMS: Full 14 system review of systems performed and notable only for: constitutional: N/A  cardiovascular: N/A Hematology/lymph: Easy bleeding respiratory: N/A endocrine: N/A  ear/nose/throat: Hearing loss  musculoskeletal: Neck pain skin: N/A genitourinary: Urination problems Gastrointestinal: N/A allergy/immunology: Runny nose neurological: Numbness sleep: N/A psychiatric: N/A   ALLERGIES: No Known Allergies  HOME MEDICATIONS: Outpatient Prescriptions Prior to Visit  Medication Sig Dispense Refill  . amLODipine-olmesartan (AZOR) 10-40 MG per tablet Take 1 tablet by mouth daily.      . Calcium Carbonate (CALCIUM 600 PO) Take 600 mg by mouth 2 (two) times daily.      . clopidogrel (PLAVIX) 75 MG tablet Take 75 mg by mouth daily.      . Ginseng 100 MG TABS Take 200 mg by mouth daily.      . Multiple Vitamin (MULITIVITAMIN WITH MINERALS) TABS Take 1 tablet by mouth daily.      Marland Kitchen omega-3 acid ethyl esters (LOVAZA) 1 G capsule Take 1 g by mouth 2 (two) times daily.      . ranitidine (ZANTAC) 300 MG tablet Take 300  mg by mouth at bedtime.      . simvastatin (ZOCOR) 40 MG tablet Take 40 mg by mouth every evening.      . Tamsulosin HCl (FLOMAX) 0.4 MG CAPS Take 0.4 mg by mouth daily.      Marland Kitchen zolpidem (AMBIEN) 10 MG tablet       . gatifloxacin (ZYMAXID) 0.5 % SOLN Place 1 drop into the right eye 4 (four) times daily.      . meloxicam (MOBIC) 7.5 MG tablet        No facility-administered medications prior to visit.    PAST MEDICAL  HISTORY: Past Medical History  Diagnosis Date  . Hypertension   . Kidney stones     hx of  . Prostate hypertrophy   . GERD (gastroesophageal reflux disease)   . Cancer     hx of melanoma  . Arthritis   . 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 Hyperlipidemia     PAST SURGICAL HISTORY: Past Surgical History  Procedure Laterality Date  . Varicose vein surgery    . Back surgery      approx 20- 25 years ago  . Scleral buckle  08/11/2011    Procedure: SCLERAL BUCKLE;  Surgeon: Sherrie George, MD;  Location: [facility] OR;  Service: Ophthalmology;  Laterality: Right;  . Gas insertion  08/11/2011    Procedure: INSERTION OF GAS;  Surgeon: Sherrie George, MD;  Location: [facility] OR;  Service: Ophthalmology;  Laterality: Right;  [id number]    FAMILY HISTORY: History reviewed. No pertinent family history.  SOCIAL HISTORY: History   Social History  . Marital Status: Married    Spouse Name: N/A    Number of Children: 2  . Years of Education: N/A   Occupational History  . builder Other    community   Social History Main Topics  . Smoking status: Former Games developer  . Smokeless tobacco: Not on file  . Alcohol Use: No  . Drug Use: No  . Sexually Active:    Other Topics Concern  . Not on file   Social History Narrative   Pt lives at home with his family.   Caffeine Use- 2 cups daily     PHYSICAL EXAM  Filed Vitals:   12/10/12 1426  BP: 109/69  Pulse: 63  Temp: 98.3 F (36.8 C)  TempSrc: Oral  Height: 5\' 11"  (1.803 m)  Weight: 195 lb (88.451 kg)   Body mass index is 27.21 kg/(m^2).  Generalized: In no acute distress   Neck: Supple, no carotid bruits   Cardiac: Regular rate rhythm, no murmur   Pulmonary: Clear to auscultation bilaterally   Musculoskeletal: No deformity   Neurological examination   Mentation: Alert oriented to time, place, history taking, language fluent, and causual conversation  Cranial nerve II-XII: Pupils were equal round reactive to light  extraocular movements were full, visual field were full on confrontational test. facial sensation and strength were normal. hearing was intact to finger rubbing bilaterally. Uvula tongue midline. head turning and shoulder shrug and were normal and symmetric.Tongue protrusion into cheek strength was normal. MOTOR: normal bulk and tone, full strength in the BUE, BLE, fine finger movements normal, no pronator drift SENSORY: normal and symmetric to light touch, pinprick, temperature, vibration COORDINATION: finger-nose-finger, heel-to-shin bilaterally, there was no truncal ataxia REFLEXES: 1+ and symmetric, plantar responses were flexor bilaterally. GAIT/STATION: Rising up from seated position without assistance, normal stance, without trunk ataxia, moderate stride, good arm swing,  smooth turning, able to perform tiptoe, and heel walking without difficulty.    DIAGNOSTIC DATA (LABS, IMAGING, TESTING) - I reviewed patient records, labs, notes, testing and imaging myself where available.  MR CERVICAL SPINE Wo CONTRAST 10/05/12: Abnormal MRI scan of the cervical spine demonstrating multilevel disc osteophyte protrusions with bilateral foraminal narrowing right more than left mainly at C4-5 and C5-6.  US CAROTID DUPLEX 10/22/12 Negative for any hemodynamically significant stenosis involving extracranial carotid arteries bilaterally.  ASSESSMENT AND PLAN 72 year old right-handed Caucasian male with right-sided neck pain likely musculoskeletal due to degenerative spine disease. 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 Vascular risk factors of hypertension and hyperlipidemia.  Advised conservative treatment regarding patient's right-sided neck pain and advised him to continue to use Aleve up to 3 times a day if necessary. Reinforced previous instructions to use local heat application as needed and continue to do neck exercises.  Suggested if symptoms  get worse we could refer him to neurosurgery to discuss treatment options or physical therapy and patient chooses to continue current plan with a wait and watch approach.  Continue clopidogrel 75 mg orally every day  for secondary stroke prevention and maintain strict control of hypertension with blood pressure goal below 130/90, and lipids with LDL cholesterol goal below 100 mg/dL.  Lipid panel to be checked today. Followup in 12 months or sooner as needed.   Mansoor Hillyard NP-C 12/10/2012, 4:53 PM  [facility] [address] [place], [place] [address] [phone]

## 2012-12-10 NOTE — Patient Instructions (Addendum)
Labwork today - Lipid panel.  Continue current treatment plan.  Follow up in 1 year, sooner as needed.

## 2012-12-11 LAB — LIPID PANEL
Chol/HDL Ratio: 2.8 ratio units (ref 0.0–5.0)
Cholesterol, Total: 108 mg/dL (ref 100–199)
HDL: 39 mg/dL — ABNORMAL LOW (ref >39)
LDL Calculated: 55 mg/dL (ref 0–99)
Triglycerides: 68 mg/dL (ref 0–149)
VLDL Cholesterol Cal: 14 mg/dL (ref 5–40)

## 2012-12-12 NOTE — Progress Notes (Signed)
Quick Note:  Spoke with patient's wife and relayed results of blood work. Mrs Bevens understood and had no questions.  ______

## 2013-02-26 DIAGNOSIS — Z23 Encounter for immunization: Secondary | ICD-10-CM | POA: Diagnosis not present

## 2013-04-19 DIAGNOSIS — M5382 Other specified dorsopathies, cervical region: Secondary | ICD-10-CM | POA: Diagnosis not present

## 2013-04-19 DIAGNOSIS — I6789 Other cerebrovascular disease: Secondary | ICD-10-CM | POA: Diagnosis not present

## 2013-04-19 DIAGNOSIS — G8929 Other chronic pain: Secondary | ICD-10-CM | POA: Diagnosis not present

## 2013-04-19 DIAGNOSIS — M542 Cervicalgia: Secondary | ICD-10-CM | POA: Diagnosis not present

## 2013-04-22 ENCOUNTER — Other Ambulatory Visit: Payer: Self-pay | Admitting: Neurology

## 2013-04-22 MED ORDER — CLOPIDOGREL BISULFATE 75 MG PO TABS: 75.0000 mg | ORAL_TABLET | Freq: Every day | ORAL | Status: DC

## 2013-05-22 ENCOUNTER — Ambulatory Visit (INDEPENDENT_AMBULATORY_CARE_PROVIDER_SITE_OTHER): Payer: Medicare Other | Admitting: Ophthalmology

## 2013-05-22 DIAGNOSIS — H33309 Unspecified retinal break, unspecified eye: Secondary | ICD-10-CM

## 2013-05-22 DIAGNOSIS — I10 Essential (primary) hypertension: Secondary | ICD-10-CM

## 2013-05-22 DIAGNOSIS — H35039 Hypertensive retinopathy, unspecified eye: Secondary | ICD-10-CM | POA: Diagnosis not present

## 2013-05-22 DIAGNOSIS — H26499 Other secondary cataract, unspecified eye: Secondary | ICD-10-CM

## 2013-05-22 DIAGNOSIS — H33009 Unspecified retinal detachment with retinal break, unspecified eye: Secondary | ICD-10-CM

## 2013-05-22 DIAGNOSIS — H43819 Vitreous degeneration, unspecified eye: Secondary | ICD-10-CM

## 2013-05-28 DIAGNOSIS — G4700 Insomnia, unspecified: Secondary | ICD-10-CM | POA: Diagnosis not present

## 2013-05-28 DIAGNOSIS — I129 Hypertensive chronic kidney disease with stage 1 through stage 4 chronic kidney disease, or unspecified chronic kidney disease: Secondary | ICD-10-CM | POA: Diagnosis not present

## 2013-05-28 DIAGNOSIS — N181 Chronic kidney disease, stage 1: Secondary | ICD-10-CM | POA: Diagnosis not present

## 2013-05-28 DIAGNOSIS — M898X9 Other specified disorders of bone, unspecified site: Secondary | ICD-10-CM | POA: Diagnosis not present

## 2013-06-18 DIAGNOSIS — M4802 Spinal stenosis, cervical region: Secondary | ICD-10-CM | POA: Diagnosis not present

## 2013-08-08 DIAGNOSIS — M549 Dorsalgia, unspecified: Secondary | ICD-10-CM | POA: Diagnosis not present

## 2013-08-08 DIAGNOSIS — M4802 Spinal stenosis, cervical region: Secondary | ICD-10-CM | POA: Diagnosis not present

## 2013-08-20 DIAGNOSIS — M4802 Spinal stenosis, cervical region: Secondary | ICD-10-CM | POA: Diagnosis not present

## 2013-10-03 ENCOUNTER — Encounter: Payer: Self-pay | Admitting: Nurse Practitioner

## 2013-10-03 ENCOUNTER — Telehealth: Payer: Self-pay | Admitting: *Deleted

## 2013-10-03 DIAGNOSIS — M4802 Spinal stenosis, cervical region: Secondary | ICD-10-CM | POA: Diagnosis not present

## 2013-10-03 NOTE — Telephone Encounter (Signed)
Louie Boston with [facility] calling in regards to stopping patients Plavix for 5 days.  Please call and advise.  Thanks

## 2013-10-11 DIAGNOSIS — M5412 Radiculopathy, cervical region: Secondary | ICD-10-CM | POA: Diagnosis not present

## 2013-10-16 DIAGNOSIS — Z961 Presence of intraocular lens: Secondary | ICD-10-CM | POA: Diagnosis not present

## 2013-10-31 DIAGNOSIS — M4802 Spinal stenosis, cervical region: Secondary | ICD-10-CM | POA: Diagnosis not present

## 2013-10-31 DIAGNOSIS — M47812 Spondylosis without myelopathy or radiculopathy, cervical region: Secondary | ICD-10-CM | POA: Diagnosis not present

## 2013-10-31 DIAGNOSIS — M5090 Cervical disc disorder, unspecified, unspecified cervical region: Secondary | ICD-10-CM | POA: Diagnosis not present

## 2013-10-31 DIAGNOSIS — M5412 Radiculopathy, cervical region: Secondary | ICD-10-CM | POA: Diagnosis not present

## 2013-11-18 DIAGNOSIS — M47812 Spondylosis without myelopathy or radiculopathy, cervical region: Secondary | ICD-10-CM | POA: Diagnosis not present

## 2013-12-10 ENCOUNTER — Ambulatory Visit (INDEPENDENT_AMBULATORY_CARE_PROVIDER_SITE_OTHER): Payer: Medicare Other | Admitting: Nurse Practitioner

## 2013-12-10 ENCOUNTER — Encounter (INDEPENDENT_AMBULATORY_CARE_PROVIDER_SITE_OTHER): Payer: Self-pay

## 2013-12-10 ENCOUNTER — Encounter: Payer: Self-pay | Admitting: Nurse Practitioner

## 2013-12-10 VITALS — BP 119/67 | HR 70 | Ht 69.0 in | Wt 199.0 lb

## 2013-12-10 DIAGNOSIS — M542 Cervicalgia: Secondary | ICD-10-CM

## 2013-12-10 DIAGNOSIS — Z8673 Personal history of transient ischemic attack (TIA), and cerebral infarction without residual deficits: Secondary | ICD-10-CM

## 2013-12-10 MED ORDER — CLOPIDOGREL BISULFATE 75 MG PO TABS: 75.0000 mg | ORAL_TABLET | Freq: Every day | ORAL | Status: DC

## 2013-12-10 NOTE — Patient Instructions (Addendum)
Recommend conservative treatment for neck pain.  Continue to use Aleve up to 2 times a day if necessary. Reinforced previous instructions to use local heat application as needed and continue to do neck exercises.   Continue clopidogrel 75 mg orally every day for secondary stroke prevention and maintain strict control of hypertension with blood pressure goal below 140/90, and lipids with LDL cholesterol goal below 100 mg/dL.  Followup in 1 year, or sooner as needed.

## 2013-12-10 NOTE — Progress Notes (Signed)
PATIENT: Christian Amour Sr. DOB: 1941-01-07  REASON FOR VISIT: routine follow up for cervicalgia, remote hx of stroke HISTORY FROM: patient  HISTORY OF PRESENT ILLNESS: [name] is a 72 year old pleasant Caucasian male who has had right-sided neck pain for the last 2 months. He states he woke up one day with right sided posterior neck pain with muscle spasm and tightness. He felt a crick in his neck. He had trouble moving his neck in either direction. He for a few days had pain radiating up behind his ear. He took Mobic initially which did not tolerate due to GI side effects. Aleve seems to work better for him and if he takes it in the morning and can get by doing his work. During the late afternoon and evening times the pain and neck movements are bothersome. He denies any radicular pain going down his spine or his arm. He denies tingling numbness or weakness in his hands. He denies any gait or balance difficulties or bladder control problems. He had x-ray of the neck done on 10/23/12 at [facility] which shows multilevel degenerative disc disease with right-sided neuroforaminal narrowing at C5-6. He denies lifting any heavy weights or any recent injury or previous neck problems.  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 Vascular risk factors of hypertension and hyperlipidemia. 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 He has been on Plavix which is tolerating well without bleeding, bruising or other side effects. He states his blood pressure and cholesterol have all been under good control.  UPDATE 12/10/12 (LL): Patient returns for followup of visit on 09/27/2012. Since that visit he had a MRI of his cervical spine without contrast and ultrasound carotid duplex bilateral. He continues to have right-sided neck pain that radiates up  behind his right ear in which he takes Aleve every 12 hours. He reports that he still has transient left eye blepharospasm which seems to be worse when he is tired or anxious. He continues to work as a Animator. He does not know the last time his cholesterol was checked. He states that his blood pressure is under good control. He continues with Plavix 75 mg daily, tolerating well, without bleeding, bruising or other side effects.   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  Traction therapy was recommend by his orthopedic specialist, Dr. Naoma Diener at [facility], along with TENS therapy; he says both have helped.  He still has mild neck pain but tolerable with daily Aleve.  His blood pressure is well controlled, it is 119/67 in the office today.  He endorses dizziness when getting up from a squatted position too quickly, but that is the only time this happens.  He has regular follow up appt at his PCP on Monday for lab work. He is tolerating Plavix well with no signs of significant bleeding or bruising.  REVIEW OF SYSTEMS: Full 14 system review of systems performed and notable only for:  Easy bleeding  Hearing loss  Neck pain  Runny nose  Env allergies Numbness  Dizziness   ALLERGIES: No Known Allergies  HOME MEDICATIONS: Outpatient Prescriptions Prior to Visit  Medication Sig Dispense Refill  . amLODipine-olmesartan (AZOR) 10-40 MG per tablet Take 1 tablet by mouth daily.      Marland Kitchen  Calcium Carbonate (CALCIUM 600 PO) Take 600 mg by mouth 2 (two) times daily.      . Ginseng 100 MG TABS Take 200 mg by mouth daily.      . Multiple Vitamin (MULITIVITAMIN WITH MINERALS) TABS Take 1 tablet by mouth daily.      Marland Kitchen omega-3 acid ethyl esters (LOVAZA) 1 G capsule Take 1 g by mouth 2 (two) times daily.      . ranitidine (ZANTAC) 300 MG tablet Take 300 mg by mouth at bedtime.      . simvastatin  (ZOCOR) 40 MG tablet Take 40 mg by mouth every evening.      . Tamsulosin HCl (FLOMAX) 0.4 MG CAPS Take 0.4 mg by mouth daily.      . VOLTAREN 1 % GEL Apply 1 application topically as needed.      . zolpidem (AMBIEN) 10 MG tablet       . clopidogrel (PLAVIX) 75 MG tablet Take 1 tablet (75 mg total) by mouth daily.  90 tablet  3   No facility-administered medications prior to visit.    PHYSICAL EXAM Filed Vitals:   12/10/13 1337  BP: 119/67  Pulse: 70  Height: 5\' 9"  (1.753 m)  Weight: 199 lb (90.266 kg)   Body mass index is 29.37 kg/(m^2). No exam data present No flowsheet data found.  No flowsheet data found.  PHYSICAL EXAM: Generalized: In no acute distress, pleasant Caucasian male Neck: Supple, no carotid bruits  Cardiac: Regular rate rhythm, 2/6 systolic murmur  Pulmonary: Clear to auscultation bilaterally  Musculoskeletal: No deformity   Neurological examination  Mentation: Alert oriented to time, place, history taking, language fluent, and casual conversation  Cranial nerve II-XII: Pupils were equal round reactive to light extraocular movements were full, visual field were full on confrontational test. facial sensation and strength were normal. hearing was intact to finger rubbing bilaterally. Uvula tongue midline. head turning and shoulder shrug and were normal and symmetric.Tongue protrusion into cheek strength was normal.  MOTOR: normal bulk and tone, full strength in the BUE, BLE, fine finger movements normal, no pronator drift  SENSORY: normal and symmetric to light touch, pinprick, temperature, vibration  COORDINATION: finger-nose-finger, heel-to-shin bilaterally, there was no truncal ataxia  REFLEXES: 1+ and symmetric, plantar responses were flexor bilaterally.  GAIT/STATION: Rising up from seated position without assistance, normal stance, without trunk ataxia, moderate stride, good arm swing, smooth turning, able to perform tiptoe, and heel walking without  difficulty.   Christian CERVICAL SPINE Wo CONTRAST 10/05/12:  Abnormal MRI scan of the cervical spine demonstrating multilevel disc osteophyte protrusions with bilateral foraminal narrowing right more than left mainly at C4-5 and C5-6.  US CAROTID DUPLEX 10/22/12  Negative for any hemodynamically significant stenosis involving extracranial carotid arteries bilaterally.   ASSESSMENT AND PLAN  73 year old right-handed Caucasian male with right-sided neck pain likely musculoskeletal due to degenerative spine disease. 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 Vascular risk factors of hypertension and hyperlipidemia.   Advised conservative treatment regarding patient's right-sided neck pain and advised him to continue to use Aleve up to 2 times a day if necessary. Reinforced previous instructions to use local heat application as needed, continue to do neck exercises and use TENS unit and traction therapy as indicated. Continue clopidogrel 75 mg orally every day for secondary stroke prevention and maintain strict control of hypertension with blood pressure goal below 140/90, and lipids with LDL cholesterol goal below 100 mg/dL.  Followup in 1 year, or sooner as needed.   Meds ordered this encounter  Medications  . clopidogrel (PLAVIX) 75 MG tablet    Sig: Take 1 tablet (75 mg total) by mouth daily.    Dispense:  90 tablet    Refill:  3    Order Specific Question:  Supervising Provider    Answer:  Andrey Spearman R [[id number]]   Rudi Rummage Richy Spradley, MSN, NP-C 12/10/2013, 2:05 PM [facility] [address], [place], [place] [address] [phone]  Note: This document was prepared with digital dictation and possible smart phrase technology. Any transcriptional errors that result from this process are unintentional.

## 2013-12-15 NOTE — Progress Notes (Signed)
I agree with the above plan 

## 2013-12-16 DIAGNOSIS — I10 Essential (primary) hypertension: Secondary | ICD-10-CM | POA: Diagnosis not present

## 2013-12-16 DIAGNOSIS — I6789 Other cerebrovascular disease: Secondary | ICD-10-CM | POA: Diagnosis not present

## 2013-12-16 DIAGNOSIS — M5382 Other specified dorsopathies, cervical region: Secondary | ICD-10-CM | POA: Diagnosis not present

## 2013-12-17 DIAGNOSIS — M4802 Spinal stenosis, cervical region: Secondary | ICD-10-CM | POA: Diagnosis not present

## 2014-02-17 DIAGNOSIS — R972 Elevated prostate specific antigen [PSA]: Secondary | ICD-10-CM | POA: Diagnosis not present

## 2014-02-17 DIAGNOSIS — I6789 Other cerebrovascular disease: Secondary | ICD-10-CM | POA: Diagnosis not present

## 2014-02-17 DIAGNOSIS — I10 Essential (primary) hypertension: Secondary | ICD-10-CM | POA: Diagnosis not present

## 2014-02-17 DIAGNOSIS — M542 Cervicalgia: Secondary | ICD-10-CM | POA: Diagnosis not present

## 2014-02-17 DIAGNOSIS — M5382 Other specified dorsopathies, cervical region: Secondary | ICD-10-CM | POA: Diagnosis not present

## 2014-02-17 DIAGNOSIS — G8929 Other chronic pain: Secondary | ICD-10-CM | POA: Diagnosis not present

## 2014-03-12 DIAGNOSIS — Z23 Encounter for immunization: Secondary | ICD-10-CM | POA: Diagnosis not present

## 2014-04-22 DIAGNOSIS — D509 Iron deficiency anemia, unspecified: Secondary | ICD-10-CM | POA: Diagnosis not present

## 2014-04-22 DIAGNOSIS — D510 Vitamin B12 deficiency anemia due to intrinsic factor deficiency: Secondary | ICD-10-CM | POA: Diagnosis not present

## 2014-04-22 DIAGNOSIS — I2782 Chronic pulmonary embolism: Secondary | ICD-10-CM | POA: Diagnosis not present

## 2014-04-22 DIAGNOSIS — I10 Essential (primary) hypertension: Secondary | ICD-10-CM | POA: Diagnosis not present

## 2014-04-22 DIAGNOSIS — D649 Anemia, unspecified: Secondary | ICD-10-CM | POA: Diagnosis not present

## 2014-04-29 DIAGNOSIS — M542 Cervicalgia: Secondary | ICD-10-CM | POA: Diagnosis not present

## 2014-04-29 DIAGNOSIS — G8929 Other chronic pain: Secondary | ICD-10-CM | POA: Diagnosis not present

## 2014-05-14 ENCOUNTER — Ambulatory Visit: Payer: Self-pay | Admitting: Hematology and Oncology

## 2014-05-14 DIAGNOSIS — Z7901 Long term (current) use of anticoagulants: Secondary | ICD-10-CM | POA: Diagnosis not present

## 2014-05-14 DIAGNOSIS — D509 Iron deficiency anemia, unspecified: Secondary | ICD-10-CM | POA: Diagnosis not present

## 2014-05-14 DIAGNOSIS — K5790 Diverticulosis of intestine, part unspecified, without perforation or abscess without bleeding: Secondary | ICD-10-CM | POA: Diagnosis not present

## 2014-05-14 DIAGNOSIS — N281 Cyst of kidney, acquired: Secondary | ICD-10-CM | POA: Diagnosis not present

## 2014-05-14 DIAGNOSIS — Z8673 Personal history of transient ischemic attack (TIA), and cerebral infarction without residual deficits: Secondary | ICD-10-CM | POA: Diagnosis not present

## 2014-05-14 DIAGNOSIS — Z79899 Other long term (current) drug therapy: Secondary | ICD-10-CM | POA: Diagnosis not present

## 2014-05-14 DIAGNOSIS — Z1159 Encounter for screening for other viral diseases: Secondary | ICD-10-CM | POA: Diagnosis not present

## 2014-05-14 DIAGNOSIS — Z87891 Personal history of nicotine dependence: Secondary | ICD-10-CM | POA: Diagnosis not present

## 2014-05-14 DIAGNOSIS — R5383 Other fatigue: Secondary | ICD-10-CM | POA: Diagnosis not present

## 2014-05-14 DIAGNOSIS — R161 Splenomegaly, not elsewhere classified: Secondary | ICD-10-CM | POA: Diagnosis not present

## 2014-05-14 LAB — CBC CANCER CENTER
Bands: 3 %
HCT: 32.5 % — ABNORMAL LOW (ref 40.0–52.0)
HGB: 10.6 g/dL — ABNORMAL LOW (ref 13.0–18.0)
Lymphocytes: 24 %
MCH: 29.6 pg (ref 26.0–34.0)
MCHC: 32.6 g/dL (ref 32.0–36.0)
MCV: 91 fL (ref 80–100)
Metamyelocyte: 3 %
Monocytes: 9 %
Myelocyte: 2 %
NRBC/100 WBC: 1 /100
Platelet: 308 x10 3/mm (ref 150–440)
RBC: 3.57 10*6/uL — ABNORMAL LOW (ref 4.40–5.90)
RDW: 18.5 % — ABNORMAL HIGH (ref 11.5–14.5)
Segmented Neutrophils: 57 %
Variant Lymphocyte: 2 %
WBC: 3 x10 3/mm — ABNORMAL LOW (ref 3.8–10.6)

## 2014-05-14 LAB — LACTATE DEHYDROGENASE: LDH: 514 U/L — ABNORMAL HIGH (ref 85–241)

## 2014-05-14 LAB — IRON AND TIBC
Iron Bind.Cap.(Total): 246 ug/dL — ABNORMAL LOW (ref 250–450)
Iron Saturation: 25 %
Iron: 61 ug/dL — ABNORMAL LOW (ref 65–175)
Unbound Iron-Bind.Cap.: 185 ug/dL

## 2014-05-14 LAB — SEDIMENTATION RATE: Erythrocyte Sed Rate: 25 mm/hr — ABNORMAL HIGH (ref 0–20)

## 2014-05-14 LAB — RETICULOCYTES
Absolute Retic Count: 0.0895 10*6/uL (ref 0.019–0.186)
Reticulocyte: 2.5 % (ref 0.4–3.1)

## 2014-05-16 LAB — PROT IMMUNOELECTROPHORES(ARMC)

## 2014-05-19 LAB — URINE IEP, RANDOM

## 2014-05-23 ENCOUNTER — Ambulatory Visit (INDEPENDENT_AMBULATORY_CARE_PROVIDER_SITE_OTHER): Payer: Medicare Other | Admitting: Ophthalmology

## 2014-05-28 DIAGNOSIS — D509 Iron deficiency anemia, unspecified: Secondary | ICD-10-CM | POA: Diagnosis not present

## 2014-05-28 DIAGNOSIS — R161 Splenomegaly, not elsewhere classified: Secondary | ICD-10-CM | POA: Diagnosis not present

## 2014-05-28 DIAGNOSIS — R5383 Other fatigue: Secondary | ICD-10-CM | POA: Diagnosis not present

## 2014-05-28 LAB — CBC CANCER CENTER
Basophil #: 0 x10 3/mm (ref 0.0–0.1)
Basophil %: 0.4 %
Eosinophil #: 0 x10 3/mm (ref 0.0–0.7)
Eosinophil %: 0.6 %
HCT: 33 % — ABNORMAL LOW (ref 40.0–52.0)
HGB: 10.8 g/dL — ABNORMAL LOW (ref 13.0–18.0)
Lymphocyte #: 0.9 x10 3/mm — ABNORMAL LOW (ref 1.0–3.6)
Lymphocyte %: 26 %
MCH: 29.4 pg (ref 26.0–34.0)
MCHC: 32.6 g/dL (ref 32.0–36.0)
MCV: 90 fL (ref 80–100)
Monocyte #: 0.5 x10 3/mm (ref 0.2–1.0)
Monocyte %: 13.6 %
Neutrophil #: 2.1 x10 3/mm (ref 1.4–6.5)
Neutrophil %: 59.4 %
Platelet: 297 x10 3/mm (ref 150–440)
RBC: 3.66 10*6/uL — ABNORMAL LOW (ref 4.40–5.90)
RDW: 18.2 % — ABNORMAL HIGH (ref 11.5–14.5)
WBC: 3.5 x10 3/mm — ABNORMAL LOW (ref 3.8–10.6)

## 2014-05-28 LAB — FERRITIN: Ferritin (ARMC): 514 ng/mL — ABNORMAL HIGH (ref 8–388)

## 2014-06-03 DIAGNOSIS — R161 Splenomegaly, not elsewhere classified: Secondary | ICD-10-CM | POA: Diagnosis not present

## 2014-06-06 ENCOUNTER — Ambulatory Visit: Payer: Self-pay | Admitting: Hematology and Oncology

## 2014-06-11 ENCOUNTER — Ambulatory Visit: Payer: Self-pay | Admitting: Hematology and Oncology

## 2014-06-11 DIAGNOSIS — C9440 Acute panmyelosis with myelofibrosis not having achieved remission: Secondary | ICD-10-CM | POA: Diagnosis not present

## 2014-06-11 DIAGNOSIS — Z79899 Other long term (current) drug therapy: Secondary | ICD-10-CM | POA: Diagnosis not present

## 2014-06-11 DIAGNOSIS — R898 Other abnormal findings in specimens from other organs, systems and tissues: Secondary | ICD-10-CM | POA: Diagnosis not present

## 2014-06-11 DIAGNOSIS — Z7902 Long term (current) use of antithrombotics/antiplatelets: Secondary | ICD-10-CM | POA: Diagnosis not present

## 2014-06-11 DIAGNOSIS — Z9889 Other specified postprocedural states: Secondary | ICD-10-CM | POA: Diagnosis not present

## 2014-06-11 DIAGNOSIS — Z8673 Personal history of transient ischemic attack (TIA), and cerebral infarction without residual deficits: Secondary | ICD-10-CM | POA: Diagnosis not present

## 2014-06-11 DIAGNOSIS — D72819 Decreased white blood cell count, unspecified: Secondary | ICD-10-CM | POA: Diagnosis not present

## 2014-06-11 DIAGNOSIS — C946 Myelodysplastic disease, not classified: Secondary | ICD-10-CM | POA: Diagnosis not present

## 2014-06-11 DIAGNOSIS — Z87891 Personal history of nicotine dependence: Secondary | ICD-10-CM | POA: Diagnosis not present

## 2014-06-11 DIAGNOSIS — D509 Iron deficiency anemia, unspecified: Secondary | ICD-10-CM | POA: Diagnosis not present

## 2014-06-11 DIAGNOSIS — D479 Neoplasm of uncertain behavior of lymphoid, hematopoietic and related tissue, unspecified: Secondary | ICD-10-CM | POA: Diagnosis not present

## 2014-06-11 DIAGNOSIS — D649 Anemia, unspecified: Secondary | ICD-10-CM | POA: Diagnosis not present

## 2014-06-11 DIAGNOSIS — D471 Chronic myeloproliferative disease: Secondary | ICD-10-CM | POA: Diagnosis not present

## 2014-06-11 LAB — CBC WITH DIFFERENTIAL/PLATELET
Bands: 3 %
Eosinophil: 1 %
HCT: 30 % — ABNORMAL LOW (ref 40.0–52.0)
HGB: 9.6 g/dL — ABNORMAL LOW (ref 13.0–18.0)
Lymphocytes: 25 %
MCH: 29.9 pg (ref 26.0–34.0)
MCHC: 32 g/dL (ref 32.0–36.0)
MCV: 93 fL (ref 80–100)
Metamyelocyte: 3 %
Monocytes: 9 %
Myelocyte: 1 %
NRBC/100 WBC: 3 /
Platelet: 272 10*3/uL (ref 150–440)
RBC: 3.22 10*6/uL — ABNORMAL LOW (ref 4.40–5.90)
RDW: 18.6 % — ABNORMAL HIGH (ref 11.5–14.5)
Segmented Neutrophils: 58 %
WBC: 3.2 10*3/uL — ABNORMAL LOW (ref 3.8–10.6)

## 2014-06-11 LAB — PROTIME-INR
INR: 1.1
Prothrombin Time: 14.2 secs (ref 11.5–14.7)

## 2014-06-12 ENCOUNTER — Ambulatory Visit (INDEPENDENT_AMBULATORY_CARE_PROVIDER_SITE_OTHER): Payer: Medicare Other | Admitting: Ophthalmology

## 2014-06-12 ENCOUNTER — Ambulatory Visit: Payer: Self-pay | Admitting: Hematology and Oncology

## 2014-06-12 DIAGNOSIS — H43813 Vitreous degeneration, bilateral: Secondary | ICD-10-CM | POA: Diagnosis not present

## 2014-06-12 DIAGNOSIS — D509 Iron deficiency anemia, unspecified: Secondary | ICD-10-CM | POA: Diagnosis not present

## 2014-06-12 DIAGNOSIS — D696 Thrombocytopenia, unspecified: Secondary | ICD-10-CM | POA: Diagnosis not present

## 2014-06-12 DIAGNOSIS — K5790 Diverticulosis of intestine, part unspecified, without perforation or abscess without bleeding: Secondary | ICD-10-CM | POA: Diagnosis not present

## 2014-06-12 DIAGNOSIS — H35033 Hypertensive retinopathy, bilateral: Secondary | ICD-10-CM

## 2014-06-12 DIAGNOSIS — Z87891 Personal history of nicotine dependence: Secondary | ICD-10-CM | POA: Diagnosis not present

## 2014-06-12 DIAGNOSIS — D471 Chronic myeloproliferative disease: Secondary | ICD-10-CM | POA: Diagnosis not present

## 2014-06-12 DIAGNOSIS — Z79899 Other long term (current) drug therapy: Secondary | ICD-10-CM | POA: Diagnosis not present

## 2014-06-12 DIAGNOSIS — Z8673 Personal history of transient ischemic attack (TIA), and cerebral infarction without residual deficits: Secondary | ICD-10-CM | POA: Diagnosis not present

## 2014-06-12 DIAGNOSIS — N281 Cyst of kidney, acquired: Secondary | ICD-10-CM | POA: Diagnosis not present

## 2014-06-12 DIAGNOSIS — H338 Other retinal detachments: Secondary | ICD-10-CM | POA: Diagnosis not present

## 2014-06-12 DIAGNOSIS — H26493 Other secondary cataract, bilateral: Secondary | ICD-10-CM

## 2014-06-12 DIAGNOSIS — I10 Essential (primary) hypertension: Secondary | ICD-10-CM

## 2014-06-12 DIAGNOSIS — R161 Splenomegaly, not elsewhere classified: Secondary | ICD-10-CM | POA: Diagnosis not present

## 2014-06-12 DIAGNOSIS — H33302 Unspecified retinal break, left eye: Secondary | ICD-10-CM | POA: Diagnosis not present

## 2014-06-12 DIAGNOSIS — R5383 Other fatigue: Secondary | ICD-10-CM | POA: Diagnosis not present

## 2014-06-12 DIAGNOSIS — Z7901 Long term (current) use of anticoagulants: Secondary | ICD-10-CM | POA: Diagnosis not present

## 2014-06-18 DIAGNOSIS — R5383 Other fatigue: Secondary | ICD-10-CM | POA: Diagnosis not present

## 2014-06-18 DIAGNOSIS — N281 Cyst of kidney, acquired: Secondary | ICD-10-CM | POA: Diagnosis not present

## 2014-06-18 DIAGNOSIS — D471 Chronic myeloproliferative disease: Secondary | ICD-10-CM | POA: Diagnosis not present

## 2014-06-18 DIAGNOSIS — D696 Thrombocytopenia, unspecified: Secondary | ICD-10-CM | POA: Diagnosis not present

## 2014-06-18 DIAGNOSIS — D509 Iron deficiency anemia, unspecified: Secondary | ICD-10-CM | POA: Diagnosis not present

## 2014-06-18 DIAGNOSIS — R161 Splenomegaly, not elsewhere classified: Secondary | ICD-10-CM | POA: Diagnosis not present

## 2014-06-18 LAB — CBC CANCER CENTER
Basophil #: 0 x10 3/mm (ref 0.0–0.1)
Basophil %: 0.4 %
Eosinophil #: 0 x10 3/mm (ref 0.0–0.7)
Eosinophil %: 0.5 %
HCT: 30.7 % — ABNORMAL LOW (ref 40.0–52.0)
HGB: 10.1 g/dL — ABNORMAL LOW (ref 13.0–18.0)
Lymphocyte #: 0.6 x10 3/mm — ABNORMAL LOW (ref 1.0–3.6)
Lymphocyte %: 23.1 %
MCH: 29.6 pg (ref 26.0–34.0)
MCHC: 33 g/dL (ref 32.0–36.0)
MCV: 90 fL (ref 80–100)
Monocyte #: 0.3 x10 3/mm (ref 0.2–1.0)
Monocyte %: 11.7 %
Neutrophil #: 1.8 x10 3/mm (ref 1.4–6.5)
Neutrophil %: 64.3 %
Platelet: 288 x10 3/mm (ref 150–440)
RBC: 3.42 10*6/uL — ABNORMAL LOW (ref 4.40–5.90)
RDW: 18.8 % — ABNORMAL HIGH (ref 11.5–14.5)
WBC: 2.8 x10 3/mm — ABNORMAL LOW (ref 3.8–10.6)

## 2014-06-18 LAB — COMPREHENSIVE METABOLIC PANEL
Albumin: 3.9 g/dL (ref 3.4–5.0)
Alkaline Phosphatase: 118 U/L — ABNORMAL HIGH
Anion Gap: 8 (ref 7–16)
BUN: 20 mg/dL — ABNORMAL HIGH (ref 7–18)
Bilirubin,Total: 0.7 mg/dL (ref 0.2–1.0)
Calcium, Total: 8.5 mg/dL (ref 8.5–10.1)
Chloride: 107 mmol/L (ref 98–107)
Co2: 26 mmol/L (ref 21–32)
Creatinine: 1.09 mg/dL (ref 0.60–1.30)
EGFR (African American): >60
EGFR (Non-African Amer.): >60
Glucose: 160 mg/dL — ABNORMAL HIGH (ref 65–99)
Osmolality: 287 (ref 275–301)
Potassium: 4.5 mmol/L (ref 3.5–5.1)
SGOT(AST): 30 U/L (ref 15–37)
SGPT (ALT): 43 U/L
Sodium: 141 mmol/L (ref 136–145)
Total Protein: 6.6 g/dL (ref 6.4–8.2)

## 2014-06-19 ENCOUNTER — Ambulatory Visit (INDEPENDENT_AMBULATORY_CARE_PROVIDER_SITE_OTHER): Payer: Medicare Other | Admitting: Ophthalmology

## 2014-06-19 DIAGNOSIS — H2701 Aphakia, right eye: Secondary | ICD-10-CM

## 2014-07-07 ENCOUNTER — Ambulatory Visit: Payer: Self-pay | Admitting: Hematology and Oncology

## 2014-07-17 DIAGNOSIS — D649 Anemia, unspecified: Secondary | ICD-10-CM | POA: Diagnosis not present

## 2014-07-17 DIAGNOSIS — D471 Chronic myeloproliferative disease: Secondary | ICD-10-CM | POA: Diagnosis not present

## 2014-07-17 DIAGNOSIS — D630 Anemia in neoplastic disease: Secondary | ICD-10-CM | POA: Insufficient documentation

## 2014-07-17 DIAGNOSIS — D709 Neutropenia, unspecified: Secondary | ICD-10-CM | POA: Diagnosis not present

## 2014-07-17 DIAGNOSIS — R162 Hepatomegaly with splenomegaly, not elsewhere classified: Secondary | ICD-10-CM | POA: Insufficient documentation

## 2014-07-17 DIAGNOSIS — D474 Osteomyelofibrosis: Secondary | ICD-10-CM | POA: Insufficient documentation

## 2014-07-24 DIAGNOSIS — K824 Cholesterolosis of gallbladder: Secondary | ICD-10-CM | POA: Diagnosis not present

## 2014-08-06 DIAGNOSIS — D709 Neutropenia, unspecified: Secondary | ICD-10-CM | POA: Diagnosis not present

## 2014-08-06 DIAGNOSIS — D471 Chronic myeloproliferative disease: Secondary | ICD-10-CM | POA: Diagnosis not present

## 2014-08-06 DIAGNOSIS — D649 Anemia, unspecified: Secondary | ICD-10-CM | POA: Diagnosis not present

## 2014-08-15 ENCOUNTER — Ambulatory Visit
Admit: 2014-08-15 | Disposition: A | Discharge status: Home / Self Care | Payer: Self-pay | Attending: Hematology and Oncology | Admitting: Hematology and Oncology

## 2014-08-15 DIAGNOSIS — D801 Nonfamilial hypogammaglobulinemia: Secondary | ICD-10-CM | POA: Diagnosis not present

## 2014-08-15 DIAGNOSIS — D509 Iron deficiency anemia, unspecified: Secondary | ICD-10-CM | POA: Diagnosis not present

## 2014-08-15 DIAGNOSIS — K5790 Diverticulosis of intestine, part unspecified, without perforation or abscess without bleeding: Secondary | ICD-10-CM | POA: Diagnosis not present

## 2014-08-15 DIAGNOSIS — D72819 Decreased white blood cell count, unspecified: Secondary | ICD-10-CM | POA: Diagnosis not present

## 2014-08-15 DIAGNOSIS — Z8673 Personal history of transient ischemic attack (TIA), and cerebral infarction without residual deficits: Secondary | ICD-10-CM | POA: Diagnosis not present

## 2014-08-15 DIAGNOSIS — R161 Splenomegaly, not elsewhere classified: Secondary | ICD-10-CM | POA: Diagnosis not present

## 2014-08-15 DIAGNOSIS — D61818 Other pancytopenia: Secondary | ICD-10-CM | POA: Diagnosis not present

## 2014-08-15 DIAGNOSIS — Z87891 Personal history of nicotine dependence: Secondary | ICD-10-CM | POA: Diagnosis not present

## 2014-08-15 DIAGNOSIS — K219 Gastro-esophageal reflux disease without esophagitis: Secondary | ICD-10-CM | POA: Diagnosis not present

## 2014-08-15 DIAGNOSIS — Z7902 Long term (current) use of antithrombotics/antiplatelets: Secondary | ICD-10-CM | POA: Diagnosis not present

## 2014-08-15 DIAGNOSIS — Z79899 Other long term (current) drug therapy: Secondary | ICD-10-CM | POA: Diagnosis not present

## 2014-08-15 DIAGNOSIS — D471 Chronic myeloproliferative disease: Secondary | ICD-10-CM | POA: Diagnosis not present

## 2014-08-15 DIAGNOSIS — I10 Essential (primary) hypertension: Secondary | ICD-10-CM | POA: Diagnosis not present

## 2014-08-15 DIAGNOSIS — N281 Cyst of kidney, acquired: Secondary | ICD-10-CM | POA: Diagnosis not present

## 2014-08-15 DIAGNOSIS — D696 Thrombocytopenia, unspecified: Secondary | ICD-10-CM | POA: Diagnosis not present

## 2014-09-05 ENCOUNTER — Ambulatory Visit
Admit: 2014-09-05 | Disposition: A | Discharge status: Home / Self Care | Payer: Self-pay | Attending: Hematology and Oncology | Admitting: Hematology and Oncology

## 2014-11-12 ENCOUNTER — Other Ambulatory Visit: Payer: Self-pay

## 2014-11-12 DIAGNOSIS — D7581 Myelofibrosis: Secondary | ICD-10-CM

## 2014-11-13 DIAGNOSIS — R162 Hepatomegaly with splenomegaly, not elsewhere classified: Secondary | ICD-10-CM | POA: Diagnosis not present

## 2014-11-13 DIAGNOSIS — D471 Chronic myeloproliferative disease: Secondary | ICD-10-CM | POA: Diagnosis not present

## 2014-11-13 DIAGNOSIS — D630 Anemia in neoplastic disease: Secondary | ICD-10-CM | POA: Diagnosis not present

## 2014-11-13 DIAGNOSIS — D709 Neutropenia, unspecified: Secondary | ICD-10-CM | POA: Diagnosis not present

## 2014-11-13 DIAGNOSIS — D649 Anemia, unspecified: Secondary | ICD-10-CM | POA: Diagnosis not present

## 2014-11-19 ENCOUNTER — Other Ambulatory Visit: Payer: Self-pay

## 2014-11-19 DIAGNOSIS — D7581 Myelofibrosis: Secondary | ICD-10-CM

## 2014-11-20 ENCOUNTER — Inpatient Hospital Stay: Payer: Medicare Other | Attending: Hematology and Oncology

## 2014-11-20 ENCOUNTER — Inpatient Hospital Stay (HOSPITAL_BASED_OUTPATIENT_CLINIC_OR_DEPARTMENT_OTHER): Payer: Medicare Other | Admitting: Hematology and Oncology

## 2014-11-20 ENCOUNTER — Encounter: Payer: Self-pay | Admitting: Hematology and Oncology

## 2014-11-20 VITALS — BP 118/74 | HR 63 | Temp 98.3°F | Ht 71.0 in | Wt 192.5 lb

## 2014-11-20 DIAGNOSIS — D7581 Myelofibrosis: Secondary | ICD-10-CM | POA: Insufficient documentation

## 2014-11-20 DIAGNOSIS — D72819 Decreased white blood cell count, unspecified: Secondary | ICD-10-CM

## 2014-11-20 DIAGNOSIS — M1990 Unspecified osteoarthritis, unspecified site: Secondary | ICD-10-CM | POA: Insufficient documentation

## 2014-11-20 DIAGNOSIS — K219 Gastro-esophageal reflux disease without esophagitis: Secondary | ICD-10-CM

## 2014-11-20 DIAGNOSIS — Z8673 Personal history of transient ischemic attack (TIA), and cerebral infarction without residual deficits: Secondary | ICD-10-CM

## 2014-11-20 DIAGNOSIS — E785 Hyperlipidemia, unspecified: Secondary | ICD-10-CM | POA: Diagnosis not present

## 2014-11-20 DIAGNOSIS — Z79899 Other long term (current) drug therapy: Secondary | ICD-10-CM | POA: Insufficient documentation

## 2014-11-20 DIAGNOSIS — D649 Anemia, unspecified: Secondary | ICD-10-CM | POA: Insufficient documentation

## 2014-11-20 DIAGNOSIS — Z87891 Personal history of nicotine dependence: Secondary | ICD-10-CM | POA: Insufficient documentation

## 2014-11-20 DIAGNOSIS — N400 Enlarged prostate without lower urinary tract symptoms: Secondary | ICD-10-CM

## 2014-11-20 LAB — CBC WITH DIFFERENTIAL/PLATELET
Basophils Absolute: 0 10*3/uL (ref 0–0.1)
Basophils Relative: 0 %
Eosinophils Absolute: 0 10*3/uL (ref 0–0.7)
Eosinophils Relative: 1 %
HCT: 32.7 % — ABNORMAL LOW (ref 40.0–52.0)
Hemoglobin: 10.5 g/dL — ABNORMAL LOW (ref 13.0–18.0)
Lymphocytes Relative: 28 %
Lymphs Abs: 0.9 10*3/uL — ABNORMAL LOW (ref 1.0–3.6)
MCH: 29.4 pg (ref 26.0–34.0)
MCHC: 32.2 g/dL (ref 32.0–36.0)
MCV: 91.3 fL (ref 80.0–100.0)
Monocytes Absolute: 0.4 10*3/uL (ref 0.2–1.0)
Monocytes Relative: 13 %
Neutro Abs: 1.8 10*3/uL (ref 1.4–6.5)
Neutrophils Relative %: 58 %
Platelets: 271 10*3/uL (ref 150–440)
RBC: 3.58 MIL/uL — ABNORMAL LOW (ref 4.40–5.90)
RDW: 19 % — ABNORMAL HIGH (ref 11.5–14.5)
WBC: 3.2 10*3/uL — ABNORMAL LOW (ref 3.8–10.6)

## 2014-11-20 NOTE — Progress Notes (Signed)
Pt here today for follow up; seen by Dr. Annabelle Harman at [facility] Monday June 9th

## 2014-11-20 NOTE — Progress Notes (Signed)
[facility] day:  11/20/2014  Chief Complaint: [name] is a 74 y.o. male  with myelofibrosis who is seen for 3 month assessment.  HPI: The patient was last seen in the medical oncology clinic on 03/11//2016.  At that time, he was seen for initial assessment by me. He had been diagnosed with myelofibrosis.  Symptomatically, he was doing well. He was asymptomatic. Exam revealed a palpable spleen tip. Labs noted mild anemia and leukopenia. Specifically hematocrit was 32.5, hemoglobin 10.5, MCV 91, platelets 265,000 white count 3300 with an ANC of 1900.  During the interim, he has done well. He voices no complaint. He sees physicians at [facility] every 4 months. Evaluation on 11/13/2014 by [name] revealed a hematocrit of 30.1, hemoglobin 9.8, platelets 271,000, and white count 3500.  His DIPPS score was 49 (age 52- 1, hemoglobin less than 10-2) and score of 4 if 1% circulating blasts included from 07/2014.  As he was asymptomatic, decision was made to monitor for progression. If he does develop progressive anemia with transfusion dependence, consideration was made for a phase II clinical trial of momelotinib (MMP).  Past Medical History  Diagnosis Date  . Hypertension   . Prostate hypertrophy   . GERD (gastroesophageal reflux disease)   . Cancer     hx of melanoma  . Arthritis   . 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 Hyperlipidemia     Past Surgical History  Procedure Laterality Date  . Varicose vein surgery    . Back surgery      approx 20- 25 years ago  . Scleral buckle  08/11/2011    Procedure: SCLERAL BUCKLE;  Surgeon: Hayden Pedro, MD;  Location: [place];  Service: Ophthalmology;  Laterality: Right;  . Gas insertion  08/11/2011    Procedure: INSERTION OF GAS;  Surgeon: Hayden Pedro, MD;  Location: [place];  Service: Ophthalmology;  Laterality: Right;  [id number]    No family history on file.  Social History:  reports  that he has quit smoking. He has never used smokeless tobacco. He reports that he does not drink alcohol or use illicit drugs.  The patient is accompanied by his wife.  Allergies:  Allergies  Allergen Reactions  . Meloxicam Nausea And Vomiting    Just not feeling good when he took Mobic    Current Medications: Current Outpatient Prescriptions  Medication Sig Dispense Refill  . BENICAR 40 MG tablet     . Calcium Carbonate (CALCIUM 600 PO) Take 600 mg by mouth 2 (two) times daily.    . clopidogrel (PLAVIX) 75 MG tablet Take 1 tablet (75 mg total) by mouth daily. 90 tablet 3  . gabapentin (NEURONTIN) 300 MG capsule     . Ginseng 100 MG TABS Take 200 mg by mouth daily.    . methocarbamol (ROBAXIN) 500 MG tablet     . Multiple Vitamin (MULITIVITAMIN WITH MINERALS) TABS Take 1 tablet by mouth daily.    Marland Kitchen omega-3 acid ethyl esters (LOVAZA) 1 G capsule Take 1 g by mouth 2 (two) times daily.    . ranitidine (ZANTAC) 300 MG tablet Take 300 mg by mouth at bedtime.    . simvastatin (ZOCOR) 40 MG tablet Take 40 mg by mouth every evening.    . Tamsulosin HCl (FLOMAX) 0.4 MG CAPS Take 0.4 mg by mouth daily.    . VOLTAREN 1 % GEL Apply 1 application  topically as needed.    . zolpidem (AMBIEN) 10 MG tablet     . amLODipine (NORVASC) 5 MG tablet     . amLODipine-olmesartan (AZOR) 10-40 MG per tablet Take 1 tablet by mouth daily.    . Cholecalciferol (VITAMIN D3) 1000 UNITS CAPS Take by mouth.     No current facility-administered medications for this visit.    Review of Systems:  GENERAL:  Feels normal.  No fevers, sweats or weight loss. PERFORMANCE STATUS (ECOG):  0 HEENT:  No visual changes, runny nose, sore throat, mouth sores or tenderness. Lungs: No shortness of breath or cough.  No hemoptysis. Cardiac:  No chest pain, palpitations, orthopnea, or PND. GI:  No nausea, vomiting, diarrhea, constipation, melena or hematochezia. GU:  No urgency, frequency, dysuria, or hematuria. Musculoskeletal:   No back pain.  No joint pain.  No muscle tenderness. Extremities:  No pain or swelling. Skin:  No rashes or skin changes. Neuro:  No headache, numbness or weakness, balance or coordination issues. Endocrine:  No diabetes, thyroid issues, hot flashes or night sweats. Psych:  No mood changes, depression or anxiety. Pain:  No focal pain. Review of systems:  All other systems reviewed and found to be negative.   Physical Exam: Blood pressure 118/74, pulse 63, temperature 98.3 F (36.8 C), temperature source Tympanic, height 5\' 11"  (1.803 m), weight 192 lb 7.4 oz (87.3 kg). GENERAL:  Well developed, well nourished, sitting comfortably in the exam room in no acute distress. MENTAL STATUS:  Alert and oriented to person, place and time. HEAD:  Pearline Cables hair.  Normocephalic, atraumatic, face symmetric, no Cushingoid features. EYES:  Blue eyes.  Pupils equal round and reactive to light and accomodation.  No conjunctivitis or scleral icterus. ENT:  Oropharynx clear without lesion.  Tongue normal. Mucous membranes moist.  RESPIRATORY:  Clear to auscultation without rales, wheezes or rhonchi. CARDIOVASCULAR:  Regular rate and rhythm without murmur, rub or gallop. ABDOMEN:  Soft, non-tender, with active bowel sounds, and a palpable spleen tip.  No hepatomegaly.  No masses. SKIN:  No rashes, ulcers or lesions. EXTREMITIES: No edema, no skin discoloration or tenderness.  No palpable cords. LYMPH NODES: No palpable cervical, supraclavicular, axillary or inguinal adenopathy  NEUROLOGICAL: Unremarkable. PSYCH:  Appropriate.   Appointment on 11/20/2014  Component Date Value Ref Range Status  . WBC 11/20/2014 3.2* 3.8 - 10.6 K/uL Final  . RBC 11/20/2014 3.58* 4.40 - 5.90 MIL/uL Final  . Hemoglobin 11/20/2014 10.5* 13.0 - 18.0 g/dL Final  . HCT 11/20/2014 32.7* 40.0 - 52.0 % Final  . MCV 11/20/2014 91.3  80.0 - 100.0 fL Final  . MCH 11/20/2014 29.4  26.0 - 34.0 pg Final  . MCHC 11/20/2014 32.2  32.0 - 36.0  g/dL Final  . RDW 11/20/2014 19.0* 11.5 - 14.5 % Final  . Platelets 11/20/2014 271  150 - 440 K/uL Final  . Neutrophils Relative % 11/20/2014 58   Final  . Neutro Abs 11/20/2014 1.8  1.4 - 6.5 K/uL Final  . Lymphocytes Relative 11/20/2014 28   Final  . Lymphs Abs 11/20/2014 0.9* 1.0 - 3.6 K/uL Final  . Monocytes Relative 11/20/2014 13   Final  . Monocytes Absolute 11/20/2014 0.4  0.2 - 1.0 K/uL Final  . Eosinophils Relative 11/20/2014 1   Final  . Eosinophils Absolute 11/20/2014 0.0  0 - 0.7 K/uL Final  . Basophils Relative 11/20/2014 0   Final  . Basophils Absolute 11/20/2014 0.0  0 - 0.1 K/uL Final  Assessment:  HELMER DULL is a 74 y.o. 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  He presented with a long history of normocytic anemia unresponsive to oral iron.  He has mild leukopenia.  He has mild splenomegaly (15.8 cm) by abdominal ultrasound on 06/04/2015.  DIPPS score is 68 (age 14- 1, hemoglobin less than 10- 2) and score of 4 if 1% circulating blasts included from 07/2014.  Bone marrow on 06/11/2014 was most consistent with primary myelofibrosis.  Bone marrow biopsy showed 1% abnormal cells: CD45+, CD5+, CD10, CD11c+/-, CD19+, CD2-+, (dim), CD22+ (dim, CD23+, CD38-/+, FMC7-, HLA-DR+, sig lambda+(dim).  Blasts were not increased 1.2%; hypercellular for age: 70%; JAK2 with reflex pending; Flow included about 1% CLL/SLL phenotype cells (PB3+) of uncertain significance amd some infiltrate into the marrow Increased atypical megakaryocytes.   Labs in 05/2014 included the following normal studies: direct [place], epo level, haptoglobin, ANA, hemoglobin electrophoresis, hepatitis B surface antigen, HIV testing, UPEP.  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  LDH was elevated (520). Hepatitis C by PCR was less than 2 IU/ml.   Symptomatically, he is doing well. Exam reveals a palpable spleen tip.  Labs reveal mild stable anemia and  leukopenia.  Plan: 1. Review interval labs and evaluation at [facility]. 2. CBC with diff today. 3. Discuss spreading out and alternating appointments here and at [facility]. 4. RTC in 6 months for MD assessment and labs (CBC with diff, CMP, LDH).  Lequita Asal, MD  11/20/2014, 11:59 PM

## 2014-11-22 ENCOUNTER — Encounter: Payer: Self-pay | Admitting: Hematology and Oncology

## 2014-12-11 ENCOUNTER — Encounter: Payer: Self-pay | Admitting: Neurology

## 2014-12-11 ENCOUNTER — Ambulatory Visit (INDEPENDENT_AMBULATORY_CARE_PROVIDER_SITE_OTHER): Payer: Medicare Other | Admitting: Neurology

## 2014-12-11 VITALS — BP 119/62 | HR 70 | Ht 71.0 in | Wt 186.2 lb

## 2014-12-11 DIAGNOSIS — I6990 Unspecified sequelae of unspecified cerebrovascular disease: Secondary | ICD-10-CM

## 2014-12-11 NOTE — Progress Notes (Signed)
PATIENT: [name] DOB: 08-23-1940  REASON FOR VISIT: routine follow up for cervicalgia, remote hx of stroke HISTORY FROM: patient  HISTORY OF PRESENT ILLNESS: [name] is a 74 year old pleasant Caucasian male who has had right-sided neck pain for the last 2 months. He states he woke up one day with right sided posterior neck pain with muscle spasm and tightness. He felt a crick in his neck. He had trouble moving his neck in either direction. He for a few days had pain radiating up behind his ear. He took Mobic initially which did not tolerate due to GI side effects. Aleve seems to work better for him and if he takes it in the morning and can get by doing his work. During the late afternoon and evening times the pain and neck movements are bothersome. He denies any radicular pain going down his spine or his arm. He denies tingling numbness or weakness in his hands. He denies any gait or balance difficulties or bladder control problems. He had x-ray of the neck done on 10/23/12 at [facility] which shows multilevel degenerative disc disease with right-sided neuroforaminal narrowing at C5-6. He denies lifting any heavy weights or any recent injury or previous neck problems.  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 Vascular risk factors of hypertension and hyperlipidemia. 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 He has been on Plavix which is tolerating well without bleeding, bruising or other side effects. He states his blood pressure and cholesterol have all been under good control.  UPDATE 12/10/12 (LL): Patient returns for followup of visit on 09/27/2012. Since that visit he had a MRI of his cervical spine without contrast and ultrasound carotid duplex bilateral. He continues to have right-sided neck pain that radiates up behind  his right ear in which he takes Aleve every 12 hours. He reports that he still has transient left eye blepharospasm which seems to be worse when he is tired or anxious. He continues to work as a Animator. He does not know the last time his cholesterol was checked. He states that his blood pressure is under good control. He continues with Plavix 75 mg daily, tolerating well, without bleeding, bruising or other side effects.   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  Traction therapy was recommend by his orthopedic specialist, Dr. Naoma Diener at [facility], along with TENS therapy; he says both have helped.  He still has mild neck pain but tolerable with daily Aleve.  His blood pressure is well controlled, it is 119/67 in the office today.  He endorses dizziness when getting up from a squatted position too quickly, but that is the only time this happens.  He has regular follow up appt at his PCP on Monday for lab work. He is tolerating Plavix well with no signs of significant bleeding or bruising. Update 12/11/2014 : He returns for follow-up after last visit 1 year ago. He continues to do well without recurrent stroke or TIA symptoms now since his initial stroke in 2009. He is tolerating Plavix well without bleeding but does have minor bruising. He states his blood pressure is well controlled and today it is 119/62 in office. He had lipid profile checked a few months ago by  his primary physician and it was fine. He is tolerating Zocor well without side effects. He continues to complain of some muscular neck pain but he has refused surgery in the past. He has been diagnosed with myelofibrosis and his seeing a hematologist at [facility]. REVIEW OF SYSTEMS: Full 14 system review of systems performed and notable only for: Hearing loss, frequent waking up, easy bruising, bleeding, memory loss, numbness in all the  systems negative    ALLERGIES: Allergies  Allergen Reactions  . Meloxicam Nausea And Vomiting    Just not feeling good when he took [place]: Outpatient Prescriptions Prior to Visit  Medication Sig Dispense Refill  . amLODipine (NORVASC) 5 MG tablet     . BENICAR 40 MG tablet     . Calcium Carbonate (CALCIUM 600 PO) Take 600 mg by mouth 2 (two) times daily.    . Cholecalciferol (VITAMIN D3) 1000 UNITS CAPS Take by mouth.    . clopidogrel (PLAVIX) 75 MG tablet Take 1 tablet (75 mg total) by mouth daily. 90 tablet 3  . gabapentin (NEURONTIN) 300 MG capsule     . Ginseng 100 MG TABS Take 200 mg by mouth daily.    . methocarbamol (ROBAXIN) 500 MG tablet     . Multiple Vitamin (MULITIVITAMIN WITH MINERALS) TABS Take 1 tablet by mouth daily.    Marland Kitchen omega-3 acid ethyl esters (LOVAZA) 1 G capsule Take 1 g by mouth 2 (two) times daily.    . ranitidine (ZANTAC) 300 MG tablet Take 300 mg by mouth at bedtime.    . simvastatin (ZOCOR) 40 MG tablet Take 40 mg by mouth every evening.    . Tamsulosin HCl (FLOMAX) 0.4 MG CAPS Take 0.4 mg by mouth daily.    Marland Kitchen zolpidem (AMBIEN) 10 MG tablet     . amLODipine-olmesartan (AZOR) 10-40 MG per tablet Take 1 tablet by mouth daily.    . VOLTAREN 1 % GEL Apply 1 application topically as needed.     No facility-administered medications prior to visit.    PHYSICAL EXAM Filed Vitals:   12/11/14 1459  BP: 119/62  Pulse: 70  Height: 5\' 11"  (1.803 m)  Weight: 186 lb 3.2 oz (84.46 kg)   Body mass index is 25.98 kg/(m^2). No exam data present No flowsheet data found.  No flowsheet data found.  PHYSICAL EXAM: Generalized: In no acute distress, pleasant Caucasian male Neck: Supple, no carotid bruits  Cardiac: Regular rate rhythm, 2/6 systolic murmur  Pulmonary: Clear to auscultation bilaterally  Musculoskeletal: No deformity   Neurological examination  Mentation: Alert oriented to time, place, history taking, language fluent, and  casual conversation  Cranial nerve II-XII: Pupils were equal round reactive to light extraocular movements were full, visual field were full on confrontational test. facial sensation and strength were normal. hearing was intact to finger rubbing bilaterally. Uvula tongue midline. head turning and shoulder shrug and were normal and symmetric.Tongue protrusion into cheek strength was normal.  MOTOR: normal bulk and tone, full strength in the BUE, BLE, fine finger movements normal, no pronator drift  SENSORY: normal and symmetric to light touch, pinprick, temperature, vibration  COORDINATION: finger-nose-finger, heel-to-shin bilaterally, there was no truncal ataxia  REFLEXES: 1+ and symmetric, plantar responses were flexor bilaterally.  GAIT/STATION: Rising up from seated position without assistance, normal stance,   good arm swing, smooth turning, able to perform tiptoe, and heel walking without difficulty.   MR CERVICAL SPINE Wo CONTRAST 10/05/12:  Abnormal MRI scan  of the cervical spine demonstrating multilevel disc osteophyte protrusions with bilateral foraminal narrowing right more than left mainly at C4-5 and C5-6.  US CAROTID DUPLEX 10/22/12  Negative for any hemodynamically significant stenosis involving extracranial carotid arteries bilaterally.   ASSESSMENT AND PLAN  74 year old right-handed Caucasian male with right-sided neck pain likely musculoskeletal due to degenerative spine disease. 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 Vascular risk factors of hypertension and hyperlipidemia.   I had a long d/w patient about his remote stroke, risk for recurrent stroke/TIAs, personally independently reviewed imaging studies and stroke evaluation results and answered questions.Continue Plavix  for secondary stroke prevention and maintain strict control of hypertension with blood pressure goal below 130/90, diabetes with hemoglobin A1c goal  below 6.5% and lipids with LDL cholesterol goal below 100 mg/dL. I also advised the patient to eat a healthy diet with plenty of whole grains, cereals, fruits and vegetables, exercise regularly and maintain ideal body weight .I advised him to do neck stretching exercises for his musculoskeletal neck pain. He was also cautioned to limit using ibuprofen due to side effects. Greater than 50% of this 25 minute visit was spent in counseling and coordination of care. Since it has been more than 6 years since his stroke I would like to release him back to the care of his primary physician and no routine follow-up appointment is necessary with me.      Meds ordered this encounter  Medications  .                           Antony Contras, MD  12/11/2014, 4:19 PM [facility] [address] [place], [place] [address] [phone]  Note: This document was prepared with digital dictation and possible smart phrase technology. Any transcriptional errors that result from this process are unintentional.

## 2014-12-11 NOTE — Patient Instructions (Signed)
I had a long d/w patient about his remote stroke, risk for recurrent stroke/TIAs, personally independently reviewed imaging studies and stroke evaluation results and answered questions.Continue Plavix  for secondary stroke prevention and maintain strict control of hypertension with blood pressure goal below 130/90, diabetes with hemoglobin A1c goal below 6.5% and lipids with LDL cholesterol goal below 100 mg/dL. I also advised the patient to eat a healthy diet with plenty of whole grains, cereals, fruits and vegetables, exercise regularly and maintain ideal body weight .I advised him to do neck stretching exercises for his musculoskeletal neck pain. He was also cautioned to limit using ibuprofen due to side effects. Greater than 50% of this 25 minute visit was spent in counseling and coordination of care. Since it has been more than 6 years since his stroke I would like to release him back to the care of his primary physician and no routine follow-up appointment is necessary with me.

## 2015-01-05 ENCOUNTER — Other Ambulatory Visit: Payer: Self-pay | Admitting: Family Medicine

## 2015-01-05 NOTE — Telephone Encounter (Signed)
Routing to provider  

## 2015-01-05 NOTE — Telephone Encounter (Signed)
Likely due for appointment- hasn't been seen since May

## 2015-01-12 DIAGNOSIS — Z85828 Personal history of other malignant neoplasm of skin: Secondary | ICD-10-CM | POA: Diagnosis not present

## 2015-01-12 DIAGNOSIS — L570 Actinic keratosis: Secondary | ICD-10-CM | POA: Diagnosis not present

## 2015-01-12 DIAGNOSIS — L905 Scar conditions and fibrosis of skin: Secondary | ICD-10-CM | POA: Diagnosis not present

## 2015-01-12 DIAGNOSIS — D489 Neoplasm of uncertain behavior, unspecified: Secondary | ICD-10-CM | POA: Diagnosis not present

## 2015-01-12 DIAGNOSIS — Z87898 Personal history of other specified conditions: Secondary | ICD-10-CM | POA: Insufficient documentation

## 2015-02-02 ENCOUNTER — Other Ambulatory Visit: Payer: Self-pay | Admitting: Family Medicine

## 2015-02-02 MED ORDER — ATORVASTATIN CALCIUM 20 MG PO TABS: 20.0000 mg | ORAL_TABLET | Freq: Every day | ORAL | Status: DC

## 2015-02-02 NOTE — Telephone Encounter (Signed)
I don't see any recent lipid panel or SGPT Also, he's on amlodipine I would recommend he STOP the simvastatin and use new cholesterol medicine called atorvastatin; new Rx sent Please ask patient to schedule a fasting appt with me in the next month or so; I'll send new Rx in the meantime

## 2015-02-02 NOTE — Telephone Encounter (Signed)
Patient notified, appointment scheduled

## 2015-02-02 NOTE — Telephone Encounter (Signed)
left message to call

## 2015-03-03 ENCOUNTER — Encounter: Payer: Self-pay | Admitting: Family Medicine

## 2015-03-03 ENCOUNTER — Ambulatory Visit (INDEPENDENT_AMBULATORY_CARE_PROVIDER_SITE_OTHER): Payer: Medicare Other | Admitting: Family Medicine

## 2015-03-03 VITALS — BP 130/77 | HR 68 | Temp 97.9°F | Ht 65.0 in | Wt 195.2 lb

## 2015-03-03 DIAGNOSIS — M1712 Unilateral primary osteoarthritis, left knee: Secondary | ICD-10-CM

## 2015-03-03 DIAGNOSIS — Z125 Encounter for screening for malignant neoplasm of prostate: Secondary | ICD-10-CM | POA: Diagnosis not present

## 2015-03-03 DIAGNOSIS — E669 Obesity, unspecified: Secondary | ICD-10-CM | POA: Insufficient documentation

## 2015-03-03 DIAGNOSIS — N400 Enlarged prostate without lower urinary tract symptoms: Secondary | ICD-10-CM | POA: Insufficient documentation

## 2015-03-03 DIAGNOSIS — I10 Essential (primary) hypertension: Secondary | ICD-10-CM | POA: Diagnosis not present

## 2015-03-03 DIAGNOSIS — M542 Cervicalgia: Secondary | ICD-10-CM | POA: Diagnosis not present

## 2015-03-03 DIAGNOSIS — E785 Hyperlipidemia, unspecified: Secondary | ICD-10-CM | POA: Diagnosis not present

## 2015-03-03 DIAGNOSIS — D7581 Myelofibrosis: Secondary | ICD-10-CM | POA: Diagnosis not present

## 2015-03-03 DIAGNOSIS — I129 Hypertensive chronic kidney disease with stage 1 through stage 4 chronic kidney disease, or unspecified chronic kidney disease: Secondary | ICD-10-CM | POA: Insufficient documentation

## 2015-03-03 DIAGNOSIS — Z87438 Personal history of other diseases of male genital organs: Secondary | ICD-10-CM | POA: Diagnosis not present

## 2015-03-03 DIAGNOSIS — Z7901 Long term (current) use of anticoagulants: Secondary | ICD-10-CM | POA: Insufficient documentation

## 2015-03-03 DIAGNOSIS — Z5181 Encounter for therapeutic drug level monitoring: Secondary | ICD-10-CM

## 2015-03-03 DIAGNOSIS — Z23 Encounter for immunization: Secondary | ICD-10-CM

## 2015-03-03 NOTE — Patient Instructions (Addendum)
Your goal blood pressure is less than 150 mmHg on top. Try to follow the DASH guidelines (DASH stands for Dietary Approaches to Stop Hypertension) Try to limit the sodium in your diet.  Ideally, consume less than 1.5 grams (less than 1,500mg ) per day. Do not add salt when cooking or at the table.  Check the sodium amount on labels when shopping, and choose items lower in sodium when given a choice. Avoid or limit foods that already contain a lot of sodium. Eat a diet rich in fruits and vegetables and whole grains. You received the flu shot today; it should protect you against the flu virus, and will take about two weeks for antibodies to develop; do try to stay away from hospitals, nursing homes, and daycares during peak flu season; taking 1000 mg of vitamin C daily during flu season may help you avoid getting sick Try to limit saturated fats in your diet (bologna, hot dogs, barbeque, cheeseburgers, hamburgers, steak, bacon, sausage, cheese, etc.) and get more fresh fruits, vegetables, and whole grains Try turmeric as a natural anti-inflammatory (for pain and arthritis). It comes in capsules where you buy aspirin and fish oil, but also as a spice where you buy pepper and garlic powder.  DASH Eating Plan DASH stands for "Dietary Approaches to Stop Hypertension." The DASH eating plan is a healthy eating plan that has been shown to reduce high blood pressure (hypertension). Additional health benefits may include reducing the risk of type 2 diabetes mellitus, heart disease, and stroke. The DASH eating plan may also help with weight loss. WHAT DO I NEED TO KNOW ABOUT THE DASH EATING PLAN? For the DASH eating plan, you will follow these general guidelines:  Choose foods with a percent daily value for sodium of less than 5% (as listed on the food label).  Use salt-free seasonings or herbs instead of table salt or sea salt.  Check with your health care provider or pharmacist before using salt  substitutes.  Eat lower-sodium products, often labeled as "lower sodium" or "no salt added."  Eat fresh foods.  Eat more vegetables, fruits, and low-fat dairy products.  Choose whole grains. Look for the word "whole" as the first word in the ingredient list.  Choose fish and skinless chicken or [place] more often than red meat. Limit fish, poultry, and meat to 6 oz (170 g) each day.  Limit sweets, desserts, sugars, and sugary drinks.  Choose heart-healthy fats.  Limit cheese to 1 oz (28 g) per day.  Eat more home-cooked food and less restaurant, buffet, and fast food.  Limit fried foods.  Cook foods using methods other than frying.  Limit canned vegetables. If you do use them, rinse them well to decrease the sodium.  When eating at a restaurant, ask that your food be prepared with less salt, or no salt if possible. WHAT FOODS CAN I EAT? Seek help from a dietitian for individual calorie needs. Grains Whole grain or whole wheat bread. Brown rice. Whole grain or whole wheat pasta. Quinoa, bulgur, and whole grain cereals. Low-sodium cereals. Corn or whole wheat flour tortillas. Whole grain cornbread. Whole grain crackers. Low-sodium crackers. Vegetables Fresh or frozen vegetables (raw, steamed, roasted, or grilled). Low-sodium or reduced-sodium tomato and vegetable juices. Low-sodium or reduced-sodium tomato sauce and paste. Low-sodium or reduced-sodium canned vegetables.  Fruits All fresh, canned (in natural juice), or frozen fruits. Meat and Other Protein Products Ground beef (85% or leaner), grass-fed beef, or beef trimmed of fat. Skinless chicken or  [place]. Ground chicken or [place]. Pork trimmed of fat. All fish and seafood. Eggs. Dried beans, peas, or lentils. Unsalted nuts and seeds. Unsalted canned beans. Dairy Low-fat dairy products, such as skim or 1% milk, 2% or reduced-fat cheeses, low-fat ricotta or cottage cheese, or plain low-fat yogurt. Low-sodium or reduced-sodium  cheeses. Fats and Oils Tub margarines without trans fats. Light or reduced-fat mayonnaise and salad dressings (reduced sodium). Avocado. Safflower, olive, or canola oils. Natural peanut or almond butter. Other Unsalted popcorn and pretzels. The items listed above may not be a complete list of recommended foods or beverages. Contact your dietitian for more options. WHAT FOODS ARE NOT RECOMMENDED? Grains White bread. White pasta. White rice. Refined cornbread. Bagels and croissants. Crackers that contain trans fat. Vegetables Creamed or fried vegetables. Vegetables in a cheese sauce. Regular canned vegetables. Regular canned tomato sauce and paste. Regular tomato and vegetable juices. Fruits Dried fruits. Canned fruit in light or heavy syrup. Fruit juice. Meat and Other Protein Products Fatty cuts of meat. Ribs, chicken wings, bacon, sausage, bologna, salami, chitterlings, fatback, hot dogs, bratwurst, and packaged luncheon meats. Salted nuts and seeds. Canned beans with salt. Dairy Whole or 2% milk, cream, half-and-half, and cream cheese. Whole-fat or sweetened yogurt. Full-fat cheeses or blue cheese. Nondairy creamers and whipped toppings. Processed cheese, cheese spreads, or cheese curds. Condiments Onion and garlic salt, seasoned salt, table salt, and sea salt. Canned and packaged gravies. Worcestershire sauce. Tartar sauce. Barbecue sauce. Teriyaki sauce. Soy sauce, including reduced sodium. Steak sauce. Fish sauce. Oyster sauce. Cocktail sauce. Horseradish. Ketchup and mustard. Meat flavorings and tenderizers. Bouillon cubes. Hot sauce. Tabasco sauce. Marinades. Taco seasonings. Relishes. Fats and Oils Butter, stick margarine, lard, shortening, ghee, and bacon fat. Coconut, palm kernel, or palm oils. Regular salad dressings. Other Pickles and olives. Salted popcorn and pretzels. The items listed above may not be a complete list of foods and beverages to avoid. Contact your dietitian for  more information. WHERE CAN I FIND MORE INFORMATION? National Heart, Lung, and Blood Institute: travelstabloid.com Document Released: 05/12/2011 Document Revised: 10/07/2013 Document Reviewed: 03/27/2013 [facility] Patient Information 2015 [place], [place]. This information is not intended to replace advice given to you by your health care provider. Make sure you discuss any questions you have with your health care provider. Cholesterol Cholesterol is a white, waxy, fat-like substance needed by your body in small amounts. The liver makes all the cholesterol you need. Cholesterol is carried from the liver by the blood through the blood vessels. Deposits of cholesterol (plaque) may build up on blood vessel walls. These make the arteries narrower and stiffer. Cholesterol plaques increase the risk for heart attack and stroke.  You cannot feel your cholesterol level even if it is very high. The only way to know it is high is with a blood test. Once you know your cholesterol levels, you should keep a record of the test results. Work with your health care provider to keep your levels in the desired range.  WHAT DO THE RESULTS MEAN?  Total cholesterol is a rough measure of all the cholesterol in your blood.   LDL is the so-called bad cholesterol. This is the type that deposits cholesterol in the walls of the arteries. You want this level to be low.   HDL is the good cholesterol because it cleans the arteries and carries the LDL away. You want this level to be high.  Triglycerides are fat that the body can either burn for energy or store. High  levels are closely linked to heart disease.  WHAT ARE THE DESIRED LEVELS OF CHOLESTEROL?  Total cholesterol below 200.   LDL below 100 for people at risk, below 70 for those at very high risk.   HDL above 50 is good, above 60 is best.   Triglycerides below 150.  HOW CAN I LOWER MY CHOLESTEROL?  Diet. Follow your diet  programs as directed by your health care provider.   Choose fish or white meat chicken and [place], roasted or baked. Limit fatty cuts of red meat, fried foods, and processed meats, such as sausage and lunch meats.   Eat lots of fresh fruits and vegetables.  Choose whole grains, beans, pasta, potatoes, and cereals.   Use only small amounts of olive, corn, or canola oils.   Avoid butter, mayonnaise, shortening, or palm kernel oils.  Avoid foods with trans fats.   Drink skim or nonfat milk and eat low-fat or nonfat yogurt and cheeses. Avoid whole milk, cream, ice cream, egg yolks, and full-fat cheeses.   Healthy desserts include angel food cake, ginger snaps, animal crackers, hard candy, popsicles, and low-fat or nonfat frozen yogurt. Avoid pastries, cakes, pies, and cookies.   Exercise. Follow your exercise programs as directed by your health care provider.   A regular program helps decrease LDL and raise HDL.   A regular program helps with weight control.   Do things that increase your activity level like gardening, walking, or taking the stairs. Ask your health care provider about how you can be more active in your daily life.   Medicine. Take medicine only as directed by your health care provider.   Medicine may be prescribed by your health care provider to help lower cholesterol and decrease the risk for heart disease.   If you have several risk factors, you may need medicine even if your levels are normal. Document Released: 02/15/2001 Document Revised: 10/07/2013 Document Reviewed: 03/06/2013 [facility] Patient Information 2015 [place], [place]. This information is not intended to replace advice given to you by your health care provider. Make sure you discuss any questions you have with your health care provider.

## 2015-03-03 NOTE — Assessment & Plan Note (Addendum)
Open invitation on the chart for referral to ortho if he decides to go that route; try turmeric as natural anti-inflammatory; weight loss would likely help

## 2015-03-03 NOTE — Progress Notes (Signed)
BP 130/77 mmHg  Pulse 68  Temp(Src) 97.9 F (36.6 C)  Ht 5\' 5"  (1.651 m)  Wt 195 lb 3.2 oz (88.542 kg)  BMI 32.48 kg/m2  SpO2 96%   Subjective:    Patient ID: [name], male    DOB: 03/19/1941, 74 y.o.   MRN: [id number]  HPI: [name] is a 74 y.o. male  Chief Complaint  Patient presents with  . Follow-up   He is going to get flu shot today; no recent illness; no problems with previous flu shots  He has a problem with his neck; only cure is surgery he was told; he has a nerve pushing against something; saw back doctor in [place]; Dr. 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 Interim medical history since our last visit reviewed. Allergies and medications reviewed and updated.  Review of Systems  Constitutional: Negative for unexpected weight change (fluctuates).  HENT: Negative for nosebleeds.   Eyes: Positive for visual disturbance (occasional blurry vision; Dr. Chong Sicilian and Dr. Zigmund Daniel [facility])). Negative for pain.  Respiratory: Negative for wheezing.   Cardiovascular: Negative for chest pain.  Gastrointestinal: Negative for abdominal pain, blood in stool and abdominal distention.  Endocrine: Negative for cold  intolerance, heat intolerance and polydipsia.  Genitourinary: Negative for hematuria, decreased urine volume and difficulty urinating.  Musculoskeletal: Positive for arthralgias and neck pain.  Neurological: Negative for tremors and headaches.  Hematological: Negative for adenopathy. Does not bruise/bleed easily.  Psychiatric/Behavioral: Negative for dysphoric mood and decreased concentration.   Per HPI unless specifically indicated above     Objective:    BP 130/77 mmHg  Pulse 68  Temp(Src) 97.9 F (36.6 C)  Ht 5\' 5"  (1.651 m)  Wt 195 lb 3.2 oz (88.542 kg)  BMI 32.48 kg/m2  SpO2 96%  Wt Readings from Last 3 Encounters:  03/03/15 195 lb 3.2 oz (88.542 kg)  10/01/14 198 lb (89.812 kg)  12/11/14 186 lb 3.2 oz (84.46 kg)    Physical Exam  Constitutional: He appears well-developed and well-nourished. No distress.  HENT:  Head: Normocephalic and atraumatic.  Eyes: EOM are normal. No scleral icterus.  Neck: No thyromegaly present.  Cardiovascular: Normal rate and regular rhythm.   Pulmonary/Chest: Effort normal and breath sounds normal.  Abdominal: Soft. Bowel sounds are normal. He exhibits no distension.  Musculoskeletal: He exhibits no edema.       Left knee: He exhibits decreased range of motion and deformity (bony enlargement). He exhibits no effusion.  Neurological: Coordination normal.  Skin: Skin is warm and dry. No pallor.  Psychiatric: He has a normal mood and  affect. His behavior is normal. Judgment and thought content normal.      Assessment & Plan:   Problem List Items Addressed This Visit      Cardiovascular and Mediastinum   Essential hypertension, benign - Primary    DASH guidelines encouraged; continue ARB; weight loss would be helpful; see AVS for recommendations        Musculoskeletal and Integument   Primary osteoarthritis of left knee    Open invitation on the chart for referral to ortho if he decides to go that route; try turmeric as natural  anti-inflammatory; weight loss would likely help        Other   Cervicalgia    Managed by spine specialist; suggested he consider turmeric as anti-inflammatory      Myelofibrosis    CBC monitored by hematologist      History of BPH    On medicine; check PSA      Dyslipidemia    Weight loss, healthier diet (decrease saturated fats, increase fruits and veggies); monitor lipids and sgpt every 6 months on statin      Relevant Orders   Lipid Panel w/o Chol/HDL Ratio (Completed)   Medication monitoring encounter   Relevant Orders   Comprehensive metabolic panel (Completed)   Obesity    Weight loss encouraged      Prostate cancer screening    Check PSA      Relevant Orders   PSA (Completed)    Other Visit Diagnoses    Need for influenza vaccination        Relevant Orders    Flu Vaccine QUAD 36+ mos PF IM (Fluarix & Fluzone Quad PF) (Completed)        Follow up plan: Return in about 6 months (around 08/31/2015).  An after-visit summary was printed and given to the patient at [facility].  Please see the patient instructions which may contain other information and recommendations beyond what is mentioned above in the assessment and plan.  Orders Placed This Encounter  Procedures  . Flu Vaccine QUAD 36+ mos PF IM (Fluarix & Fluzone Quad PF)  . Lipid Panel w/o Chol/HDL Ratio  . Comprehensive metabolic panel  . PSA

## 2015-03-04 LAB — COMPREHENSIVE METABOLIC PANEL
ALT: 22 IU/L (ref 0–44)
AST: 23 IU/L (ref 0–40)
Albumin/Globulin Ratio: 2 (ref 1.1–2.5)
Albumin: 4 g/dL (ref 3.5–4.8)
Alkaline Phosphatase: 84 IU/L (ref 39–117)
BUN/Creatinine Ratio: 20 (ref 10–22)
BUN: 20 mg/dL (ref 8–27)
Bilirubin Total: 0.6 mg/dL (ref 0.0–1.2)
CO2: 23 mmol/L (ref 18–29)
Calcium: 9.3 mg/dL (ref 8.6–10.2)
Chloride: 106 mmol/L (ref 97–108)
Creatinine, Ser: 0.98 mg/dL (ref 0.76–1.27)
GFR calc Af Amer: 87 mL/min/{1.73_m2} (ref >59)
GFR calc non Af Amer: 76 mL/min/{1.73_m2} (ref >59)
Globulin, Total: 2 g/dL (ref 1.5–4.5)
Glucose: 114 mg/dL — ABNORMAL HIGH (ref 65–99)
Potassium: 5.1 mmol/L (ref 3.5–5.2)
Sodium: 143 mmol/L (ref 134–144)
Total Protein: 6 g/dL (ref 6.0–8.5)

## 2015-03-04 LAB — LIPID PANEL W/O CHOL/HDL RATIO
Cholesterol, Total: 95 mg/dL — ABNORMAL LOW (ref 100–199)
HDL: 33 mg/dL — ABNORMAL LOW (ref >39)
LDL Calculated: 42 mg/dL (ref 0–99)
Triglycerides: 98 mg/dL (ref 0–149)
VLDL Cholesterol Cal: 20 mg/dL (ref 5–40)

## 2015-03-04 LAB — PSA: Prostate Specific Ag, Serum: 1.1 ng/mL (ref 0.0–4.0)

## 2015-03-06 NOTE — Assessment & Plan Note (Signed)
DASH guidelines encouraged; continue ARB; weight loss would be helpful; see AVS for recommendations

## 2015-03-06 NOTE — Assessment & Plan Note (Signed)
Managed by spine specialist; suggested he consider turmeric as anti-inflammatory

## 2015-03-06 NOTE — Assessment & Plan Note (Signed)
Weight loss, healthier diet (decrease saturated fats, increase fruits and veggies); monitor lipids and sgpt every 6 months on statin

## 2015-03-06 NOTE — Assessment & Plan Note (Signed)
Weight loss encouraged 

## 2015-03-06 NOTE — Assessment & Plan Note (Signed)
CBC monitored by hematologist

## 2015-03-06 NOTE — Assessment & Plan Note (Signed)
Check PSA. ?

## 2015-03-06 NOTE — Assessment & Plan Note (Signed)
On medicine; check PSA

## 2015-03-09 ENCOUNTER — Other Ambulatory Visit: Payer: Self-pay | Admitting: Family Medicine

## 2015-03-09 DIAGNOSIS — E785 Hyperlipidemia, unspecified: Secondary | ICD-10-CM

## 2015-03-10 ENCOUNTER — Other Ambulatory Visit: Payer: Self-pay

## 2015-03-10 MED ORDER — TAMSULOSIN HCL 0.4 MG PO CAPS: 0.4000 mg | ORAL_CAPSULE | Freq: Every day | ORAL | Status: DC

## 2015-03-10 MED ORDER — ATORVASTATIN CALCIUM 10 MG PO TABS: 10.0000 mg | ORAL_TABLET | Freq: Every day | ORAL | Status: DC

## 2015-03-10 NOTE — Telephone Encounter (Signed)
Please let patient know that I'd like to decrease his cholesterol medicine from 20 mg to 10 mg New Rx sent His kidney function and liver function tests are normal His PSA is 1.1 (normal is less than 4) Recheck fasting labs in just under 3 months (when he gets down to about 4-5 pills in his last refill of the 10 mg strength

## 2015-03-10 NOTE — Telephone Encounter (Signed)
Patient was last seen 03/03/15 and pharmacy is [facility].

## 2015-03-10 NOTE — Assessment & Plan Note (Signed)
Decrease statin; recheck fasting lipids in 12 weeks

## 2015-03-11 NOTE — Telephone Encounter (Signed)
Patient came in, discussed results with him.

## 2015-03-17 ENCOUNTER — Other Ambulatory Visit: Payer: Self-pay | Admitting: Family Medicine

## 2015-03-17 NOTE — Telephone Encounter (Signed)
Routing to provider  

## 2015-03-17 NOTE — Telephone Encounter (Signed)
rx approved

## 2015-03-19 DIAGNOSIS — R162 Hepatomegaly with splenomegaly, not elsewhere classified: Secondary | ICD-10-CM | POA: Diagnosis not present

## 2015-03-19 DIAGNOSIS — D471 Chronic myeloproliferative disease: Secondary | ICD-10-CM | POA: Diagnosis not present

## 2015-03-19 DIAGNOSIS — D630 Anemia in neoplastic disease: Secondary | ICD-10-CM | POA: Diagnosis not present

## 2015-03-19 DIAGNOSIS — D649 Anemia, unspecified: Secondary | ICD-10-CM | POA: Diagnosis not present

## 2015-05-15 ENCOUNTER — Other Ambulatory Visit: Payer: Self-pay | Admitting: Family Medicine

## 2015-05-15 NOTE — Telephone Encounter (Signed)
Routing to provider  

## 2015-05-19 ENCOUNTER — Other Ambulatory Visit: Payer: Self-pay

## 2015-05-22 ENCOUNTER — Inpatient Hospital Stay: Payer: Medicare Other | Attending: Hematology and Oncology

## 2015-05-22 ENCOUNTER — Inpatient Hospital Stay: Payer: Medicare Other | Admitting: Hematology and Oncology

## 2015-05-25 ENCOUNTER — Other Ambulatory Visit: Payer: Self-pay | Admitting: Family Medicine

## 2015-05-25 NOTE — Telephone Encounter (Signed)
Routing to provider  

## 2015-05-25 NOTE — Telephone Encounter (Signed)
rx sent

## 2015-06-12 ENCOUNTER — Inpatient Hospital Stay: Payer: Medicare Other | Attending: Hematology and Oncology

## 2015-06-12 ENCOUNTER — Inpatient Hospital Stay (HOSPITAL_BASED_OUTPATIENT_CLINIC_OR_DEPARTMENT_OTHER): Payer: Medicare Other | Admitting: Hematology and Oncology

## 2015-06-12 ENCOUNTER — Other Ambulatory Visit: Payer: Self-pay | Admitting: Family Medicine

## 2015-06-12 VITALS — BP 124/75 | HR 59 | Temp 96.0°F | Resp 18 | Ht 71.0 in | Wt 194.4 lb

## 2015-06-12 DIAGNOSIS — E785 Hyperlipidemia, unspecified: Secondary | ICD-10-CM

## 2015-06-12 DIAGNOSIS — Z85820 Personal history of malignant melanoma of skin: Secondary | ICD-10-CM

## 2015-06-12 DIAGNOSIS — K219 Gastro-esophageal reflux disease without esophagitis: Secondary | ICD-10-CM | POA: Diagnosis not present

## 2015-06-12 DIAGNOSIS — M1990 Unspecified osteoarthritis, unspecified site: Secondary | ICD-10-CM | POA: Diagnosis not present

## 2015-06-12 DIAGNOSIS — Z8673 Personal history of transient ischemic attack (TIA), and cerebral infarction without residual deficits: Secondary | ICD-10-CM

## 2015-06-12 DIAGNOSIS — Z79899 Other long term (current) drug therapy: Secondary | ICD-10-CM

## 2015-06-12 DIAGNOSIS — N400 Enlarged prostate without lower urinary tract symptoms: Secondary | ICD-10-CM | POA: Diagnosis not present

## 2015-06-12 DIAGNOSIS — D7581 Myelofibrosis: Secondary | ICD-10-CM | POA: Diagnosis not present

## 2015-06-12 DIAGNOSIS — D649 Anemia, unspecified: Secondary | ICD-10-CM | POA: Diagnosis not present

## 2015-06-12 DIAGNOSIS — I10 Essential (primary) hypertension: Secondary | ICD-10-CM | POA: Insufficient documentation

## 2015-06-12 DIAGNOSIS — D72819 Decreased white blood cell count, unspecified: Secondary | ICD-10-CM | POA: Diagnosis not present

## 2015-06-12 DIAGNOSIS — Z87891 Personal history of nicotine dependence: Secondary | ICD-10-CM | POA: Insufficient documentation

## 2015-06-12 LAB — COMPREHENSIVE METABOLIC PANEL
ALT: 24 U/L (ref 17–63)
AST: 26 U/L (ref 15–41)
Albumin: 4.6 g/dL (ref 3.5–5.0)
Alkaline Phosphatase: 76 U/L (ref 38–126)
Anion gap: 4 — ABNORMAL LOW (ref 5–15)
BUN: 27 mg/dL — ABNORMAL HIGH (ref 6–20)
CO2: 25 mmol/L (ref 22–32)
Calcium: 8.7 mg/dL — ABNORMAL LOW (ref 8.9–10.3)
Chloride: 111 mmol/L (ref 101–111)
Creatinine, Ser: 1.07 mg/dL (ref 0.61–1.24)
GFR calc Af Amer: >60 mL/min (ref >60)
GFR calc non Af Amer: >60 mL/min (ref >60)
Glucose, Bld: 96 mg/dL (ref 65–99)
Potassium: 4.7 mmol/L (ref 3.5–5.1)
Sodium: 140 mmol/L (ref 135–145)
Total Bilirubin: 1.1 mg/dL (ref 0.3–1.2)
Total Protein: 7 g/dL (ref 6.5–8.1)

## 2015-06-12 LAB — CBC WITH DIFFERENTIAL/PLATELET
Basophils Absolute: 0 10*3/uL (ref 0–0.1)
Basophils Relative: 0 %
Eosinophils Absolute: 0 10*3/uL (ref 0–0.7)
Eosinophils Relative: 0 %
HCT: 31.7 % — ABNORMAL LOW (ref 40.0–52.0)
Hemoglobin: 10.5 g/dL — ABNORMAL LOW (ref 13.0–18.0)
Lymphocytes Relative: 22 %
Lymphs Abs: 0.8 10*3/uL — ABNORMAL LOW (ref 1.0–3.6)
MCH: 30 pg (ref 26.0–34.0)
MCHC: 33.2 g/dL (ref 32.0–36.0)
MCV: 90.3 fL (ref 80.0–100.0)
Monocytes Absolute: 0.4 10*3/uL (ref 0.2–1.0)
Monocytes Relative: 12 %
Neutro Abs: 2.3 10*3/uL (ref 1.4–6.5)
Neutrophils Relative %: 66 %
Platelets: 286 10*3/uL (ref 150–440)
RBC: 3.5 MIL/uL — ABNORMAL LOW (ref 4.40–5.90)
RDW: 18.3 % — ABNORMAL HIGH (ref 11.5–14.5)
WBC: 3.6 10*3/uL — ABNORMAL LOW (ref 3.8–10.6)

## 2015-06-12 LAB — LACTATE DEHYDROGENASE: LDH: 453 U/L — ABNORMAL HIGH (ref 98–192)

## 2015-06-12 NOTE — Telephone Encounter (Signed)
Seeing Dr. Mike Gip for myelofibrosis; will allow one Rx, will need CBC prior to next

## 2015-06-12 NOTE — Progress Notes (Signed)
[facility] day:  06/12/2015  Chief Complaint: [name] is a 75 y.o. male  with myelofibrosis who is seen for 6 month assessment.  HPI: The patient was last seen in the medical oncology clinic on 06/16//2016.  At that time, he was seen for 3 month assessment.  He was doing well. He voiced no complaint. Labs included a hematocrit of 32.7, hemoglobin 10.5, MCV 91.3, platelets 271,000, WBC 3200 with an ANC 1800.    During the interim, he has done well. He works full-time as a Animator. His wife states that he never stops.  He denies any systemic complaints.  Past Medical History  Diagnosis Date  . Hypertension   . Prostate hypertrophy   . GERD (gastroesophageal reflux disease)   . Cancer     hx of melanoma  . Arthritis   . 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 Hyperlipidemia   . Myelofibrosis   . History of retinal detachment     Past Surgical History  Procedure Laterality Date  . Varicose vein surgery    . Back surgery      approx 20- 25 years ago  . Scleral buckle  08/11/2011    Procedure: SCLERAL BUCKLE;  Surgeon: Hayden Pedro, MD;  Location: [place];  Service: Ophthalmology;  Laterality: Right;  . Gas insertion  08/11/2011    Procedure: INSERTION OF GAS;  Surgeon: Hayden Pedro, MD;  Location: [place];  Service: Ophthalmology;  Laterality: Right;  C3F8    Family History  Problem Relation Age of Onset  . Heart disease Father   . Diabetes Son     Social History:  reports that he has quit smoking. He has never used smokeless tobacco. He reports that he does not drink alcohol or use illicit drugs.  He works full-time as a Animator.  The patient is accompanied by his wife.  Allergies:  Allergies  Allergen Reactions  . Meloxicam Nausea And Vomiting    Just not feeling good when he took Mobic    Current Medications: Current Outpatient Prescriptions  Medication Sig Dispense Refill  . atorvastatin  (LIPITOR) 10 MG tablet TAKE ONE TABLET AT BEDTIME 30 tablet 0  . BENICAR 40 MG tablet Take 40 mg by mouth daily.     . Calcium Carbonate (CALCIUM 600 PO) Take 600 mg by mouth 2 (two) times daily.    . Cholecalciferol (VITAMIN D3) 1000 UNITS CAPS Take 1,000 Units by mouth daily.     . clopidogrel (PLAVIX) 75 MG tablet TAKE ONE TABLET BY MOUTH EVERY DAY 90 tablet 0  . gabapentin (NEURONTIN) 300 MG capsule TAKE 1 CAPSULE 3 TIMES DAILY 270 capsule 1  . Ginseng 100 MG TABS Take 200 mg by mouth daily.    . methocarbamol (ROBAXIN) 500 MG tablet Take 500 mg by mouth 3 (three) times daily.     . Multiple Vitamin (MULITIVITAMIN WITH MINERALS) TABS Take 1 tablet by mouth daily.    Marland Kitchen omega-3 acid ethyl esters (LOVAZA) 1 G capsule Take 1 g by mouth 2 (two) times daily.    . ranitidine (ZANTAC) 300 MG tablet Take 300 mg by mouth at bedtime.    . tamsulosin (FLOMAX) 0.4 MG CAPS capsule Take 1 capsule (0.4 mg total) by mouth daily. 30 capsule 11  . VOLTAREN 1 % GEL Apply 1 application topically as needed.    . zolpidem (AMBIEN) 10 MG tablet  No current facility-administered medications for this visit.    Review of Systems:  GENERAL:  Feels good.  Active.  No fevers, sweats or weight loss. PERFORMANCE STATUS (ECOG):  0 HEENT:  No visual changes, runny nose, sore throat, mouth sores or tenderness. Lungs: No shortness of breath or cough.  No hemoptysis. Cardiac:  No chest pain, palpitations, orthopnea, or PND. GI:  No nausea, vomiting, diarrhea, constipation, melena or hematochezia. GU:  No urgency, frequency, dysuria, or hematuria. Musculoskeletal:  No back pain.  No joint pain.  No muscle tenderness. Extremities:  No pain or swelling. Skin:  No rashes or skin changes. Neuro:  No headache, numbness or weakness, balance or coordination issues. Endocrine:  No diabetes, thyroid issues, hot flashes or night sweats. Psych:  No mood changes, depression or anxiety. Pain:  No focal pain. Review of  systems:  All other systems reviewed and found to be negative.   Physical Exam: Blood pressure 124/75, pulse 59, temperature 96 F (35.6 C), temperature source Tympanic, resp. rate 18, height 5\' 11"  (1.803 m), weight 194 lb 7.1 oz (88.2 kg). GENERAL:  Well developed, well nourished, sitting comfortably in the exam room in no acute distress. MENTAL STATUS:  Alert and oriented to person, place and time. HEAD:  Short gray hair.  Normocephalic, atraumatic, face symmetric, no Cushingoid features. EYES:  Blue eyes.  Pupils equal round and reactive to light and accomodation.  No conjunctivitis or scleral icterus. ENT:  Oropharynx clear without lesion.  Tongue normal. Mucous membranes moist.  RESPIRATORY:  Clear to auscultation without rales, wheezes or rhonchi. CARDIOVASCULAR:  Regular rate and rhythm without murmur, rub or gallop. ABDOMEN:  Soft, non-tender, with active bowel sounds, and a palpable spleen tip (stable).  No hepatomegaly.  No masses. SKIN:  No rashes, ulcers or lesions. EXTREMITIES: No edema, no skin discoloration or tenderness.  No palpable cords. LYMPH NODES: No palpable cervical, supraclavicular, axillary or inguinal adenopathy  NEUROLOGICAL: Unremarkable. PSYCH:  Appropriate.   Appointment on 06/12/2015  Component Date Value Ref Range Status  . WBC 06/12/2015 3.6* 3.8 - 10.6 K/uL Final  . RBC 06/12/2015 3.50* 4.40 - 5.90 MIL/uL Final  . Hemoglobin 06/12/2015 10.5* 13.0 - 18.0 g/dL Final  . HCT 08/10/2015 31.7* 40.0 - 52.0 % Final  . MCV 06/12/2015 90.3  80.0 - 100.0 fL Final  . MCH 06/12/2015 30.0  26.0 - 34.0 pg Final  . MCHC 06/12/2015 33.2  32.0 - 36.0 g/dL Final  . RDW 08/10/2015 18.3* 11.5 - 14.5 % Final  . Platelets 06/12/2015 286  150 - 440 K/uL Final  . Neutrophils Relative % 06/12/2015 66   Final  . Neutro Abs 06/12/2015 2.3  1.4 - 6.5 K/uL Final  . Lymphocytes Relative 06/12/2015 22   Final  . Lymphs Abs 06/12/2015 0.8* 1.0 - 3.6 K/uL Final  . Monocytes  Relative 06/12/2015 12   Final  . Monocytes Absolute 06/12/2015 0.4  0.2 - 1.0 K/uL Final  . Eosinophils Relative 06/12/2015 0   Final  . Eosinophils Absolute 06/12/2015 0.0  0 - 0.7 K/uL Final  . Basophils Relative 06/12/2015 0   Final  . Basophils Absolute 06/12/2015 0.0  0 - 0.1 K/uL Final  . Sodium 06/12/2015 140  135 - 145 mmol/L Final  . Potassium 06/12/2015 4.7  3.5 - 5.1 mmol/L Final  . Chloride 06/12/2015 111  101 - 111 mmol/L Final  . CO2 06/12/2015 25  22 - 32 mmol/L Final  . Glucose, Bld 06/12/2015 96  65 - 99 mg/dL Final  . BUN 06/12/2015 27* 6 - 20 mg/dL Final  . Creatinine, Ser 06/12/2015 1.07  0.61 - 1.24 mg/dL Final  . Calcium 06/12/2015 8.7* 8.9 - 10.3 mg/dL Final  . Total Protein 06/12/2015 7.0  6.5 - 8.1 g/dL Final  . Albumin 06/12/2015 4.6  3.5 - 5.0 g/dL Final  . AST 06/12/2015 26  15 - 41 U/L Final  . ALT 06/12/2015 24  17 - 63 U/L Final  . Alkaline Phosphatase 06/12/2015 76  38 - 126 U/L Final  . Total Bilirubin 06/12/2015 1.1  0.3 - 1.2 mg/dL Final  . GFR calc non Af Amer 06/12/2015 >60  >60 mL/min Final  . GFR calc Af Amer 06/12/2015 >60  >60 mL/min Final   Comment: (NOTE) The eGFR has been calculated using the CKD EPI equation. This calculation has not been validated in all clinical situations. eGFR's persistently <60 mL/min signify possible Chronic Kidney Disease.   . Anion gap 06/12/2015 4* 5 - 15 Final  . LDH 06/12/2015 453* 98 - 192 U/L Final    Assessment:  DESTIN VINSANT is a 75 y.o. 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  He presented with a long history of normocytic anemia unresponsive to oral iron.  He has mild leukopenia.  He has mild splenomegaly (15.8 cm) by abdominal ultrasound on 06/04/2015.  DIPPS score is 56 (age 45- 1, hemoglobin less than 10- 2) and score of 4 if 1% circulating blasts included from 07/2014.  Bone marrow on 06/11/2014 was most consistent with primary myelofibrosis.  Bone marrow biopsy showed 1% abnormal  cells: CD45+, CD5+, CD10, CD11c+/-, CD19+, CD2-+, (dim), CD22+ (dim, CD23+, CD38-/+, FMC7-, HLA-DR+, sig lambda+(dim).  Blasts were not increased 1.2%; hypercellular for age: 71%; JAK2 with reflex pending; Flow included about 1% CLL/SLL phenotype cells (EF2+) of uncertain significance amd some infiltrate into the marrow Increased atypical megakaryocytes.   Labs in 05/2014 included the following normal studies: direct [place], epo level, haptoglobin, ANA, hemoglobin electrophoresis, hepatitis B surface antigen, HIV testing, UPEP.  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  LDH was elevated (520). Hepatitis C by PCR was less than 2 IU/ml.   Symptomatically, he continues to do well. Exam reveals a palpable spleen tip.  Labs reveal mild stable anemia and leukopenia.  Plan: 1. Review interval labs. 2. Labs today:  CBC with diff, CMP, LDH. 3. Alternate appointments here and at [facility]. 4. RTC in 6 months for MD assessment and labs (CBC with diff, CMP, LDH).   Lequita Asal, MD  06/12/2015, 3:31 PM

## 2015-06-22 ENCOUNTER — Ambulatory Visit (INDEPENDENT_AMBULATORY_CARE_PROVIDER_SITE_OTHER): Payer: Medicare Other | Admitting: Ophthalmology

## 2015-07-08 ENCOUNTER — Ambulatory Visit
Admission: RE | Admit: 2015-07-08 | Discharge: 2015-07-08 | Disposition: A | Discharge status: Home / Self Care | Payer: Medicare Other | Source: Ambulatory Visit | Attending: Family Medicine | Admitting: Family Medicine

## 2015-07-08 ENCOUNTER — Ambulatory Visit (INDEPENDENT_AMBULATORY_CARE_PROVIDER_SITE_OTHER): Payer: Medicare Other | Admitting: Family Medicine

## 2015-07-08 ENCOUNTER — Telehealth: Payer: Self-pay | Admitting: Family Medicine

## 2015-07-08 ENCOUNTER — Encounter: Payer: Self-pay | Admitting: Family Medicine

## 2015-07-08 VITALS — BP 131/74 | HR 64 | Temp 97.8°F | Wt 194.0 lb

## 2015-07-08 DIAGNOSIS — R1903 Right lower quadrant abdominal swelling, mass and lump: Secondary | ICD-10-CM | POA: Diagnosis not present

## 2015-07-08 DIAGNOSIS — I10 Essential (primary) hypertension: Secondary | ICD-10-CM

## 2015-07-08 DIAGNOSIS — R938 Abnormal findings on diagnostic imaging of other specified body structures: Secondary | ICD-10-CM | POA: Diagnosis not present

## 2015-07-08 DIAGNOSIS — E875 Hyperkalemia: Secondary | ICD-10-CM

## 2015-07-08 DIAGNOSIS — Z23 Encounter for immunization: Secondary | ICD-10-CM

## 2015-07-08 DIAGNOSIS — R197 Diarrhea, unspecified: Secondary | ICD-10-CM | POA: Insufficient documentation

## 2015-07-08 DIAGNOSIS — R198 Other specified symptoms and signs involving the digestive system and abdomen: Secondary | ICD-10-CM | POA: Diagnosis not present

## 2015-07-08 HISTORY — PX: ASPIRATION / INJECTION RENAL CYST: SUR114

## 2015-07-08 LAB — MICROSCOPIC EXAMINATION

## 2015-07-08 LAB — UA/M W/RFLX CULTURE, ROUTINE
Bilirubin, UA: NEGATIVE
Glucose, UA: NEGATIVE
Ketones, UA: NEGATIVE
Leukocytes, UA: NEGATIVE
Nitrite, UA: NEGATIVE
RBC, UA: NEGATIVE
Specific Gravity, UA: 1.025 (ref 1.005–1.030)
Urobilinogen, Ur: 0.2 mg/dL (ref 0.2–1.0)
pH, UA: 5.5 (ref 5.0–7.5)

## 2015-07-08 NOTE — Assessment & Plan Note (Signed)
Nontender; will send patient now for AAS and then schedule CT scan; urine and bloodwork ordered; close f/u

## 2015-07-08 NOTE — Telephone Encounter (Signed)
Patient returned your call.

## 2015-07-08 NOTE — Patient Instructions (Addendum)
Please have xrays done today at [facility]; you do not need an appointment for those [facility] contact you about your xray and lab results We'll get an ultrasound of your abdomen Try starting a probiotic, once a day Follow-up in 10-12 days to make sure your symptoms have resolved If your symptoms significantly worsen or you develop pain or blood in your stool or urine, please seek medical attention right away

## 2015-07-08 NOTE — Telephone Encounter (Signed)
I spoke with patient's wife; explained I could not reach him on his cell; we'll get CT scan and let him know about the labs

## 2015-07-08 NOTE — Assessment & Plan Note (Signed)
Controlled today 

## 2015-07-08 NOTE — Assessment & Plan Note (Signed)
Facial tics following stroke; patient has had botox injection, has seen neurologist; no new plan

## 2015-07-08 NOTE — Progress Notes (Signed)
BP 131/74 mmHg  Pulse 64  Temp(Src) 97.8 F (36.6 C)  Wt 194 lb (87.998 kg)  SpO2 99%   Subjective:    Patient ID: [name], male    DOB: 06-30-1940, 75 y.o.   MRN: [id number]  HPI: [name] is a 75 y.o. male  Chief Complaint  Patient presents with  . Diarrhea    intermittent in the last few weeks and gassy; diet hasn't chaged  . Immunizations    he is due for another pneumonia, is it ok with GI issues today?   He has had diarrhea; it's wet but kind of clear, sometimes brown normal stool Not bad, just enough to make him uncomfortable, more like stains No blood in the stool No abdominal pain; no abdominal cramping No nausea or vomiting He had diverticulitis years ago; he avoids all the seeds, no problems in a long time No fevers No travel Appetite is good Weight is stable No sick cats or dogs No pet turtles Water source is well water; usually drinks bottle water; wife has had some loose stools No raw seafood; cooked oysters No fresh water in last several months Has not tried anything Diet is the same Intermittent; comes and goes, no relation to stress 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 Jamal Collin  Relevant past medical, surgical, family and social history reviewed and updated as indicated Sees Dr. Annabelle Harman at [facility] for myelofibrosis; also sees Dr. Mike Gip here at [facility] . Past Medical History  Diagnosis Date  . Hypertension   . Prostate hypertrophy   . GERD (gastroesophageal reflux disease)   . Cancer (HCC)     hx of melanoma  . Arthritis   . Stroke [facility]) June 2009    R brain subcortical infarct  . Hyperlipidemia   . Myelofibrosis ([place])   . History of retinal detachment   right eye detached, Dr. Zigmund Daniel; no vision loss residual  Past Surgical  History  Procedure Laterality Date  . Varicose vein surgery    . Back surgery      approx 20- 25 years ago  . Scleral buckle  08/11/2011    Procedure: SCLERAL BUCKLE;  Surgeon: Hayden Pedro, MD;  Location: [place];  Service: Ophthalmology;  Laterality: Right;  . Gas insertion  08/11/2011    Procedure: INSERTION OF GAS;  Surgeon: Hayden Pedro, MD;  Location: [place];  Service: Ophthalmology;  Laterality: Right;  C3F8   Family History  Problem Relation Age of Onset  . Heart disease Father   . Diabetes Son    Social History  Substance Use Topics  . Smoking status: Former Research scientist (life sciences)  . Smokeless tobacco: Never Used  . Alcohol Use: No   Interim medical history since our last visit reviewed. Allergies and medications reviewed and updated.  Review of Systems Per HPI unless specifically indicated above     Objective:    BP 131/74 mmHg  Pulse 64  Temp(Src) 97.8 F (36.6 C)  Wt 194 lb (87.998 kg)  SpO2 99%  Wt Readings from Last 3 Encounters:  07/08/15 194 lb (87.998 kg)  06/12/15 194 lb 7.1 oz (88.2 kg)  03/03/15 195 lb 3.2 oz (88.542 kg)    Physical Exam  Constitutional: He appears well-developed and well-nourished. No distress.  HENT:  Mouth/Throat: Mucous membranes are normal.  Eyes: No scleral icterus.  Neck: No JVD present.  Cardiovascular: Normal rate and regular rhythm.   Extrasystoles (one skipped beat) are present.  Pulmonary/Chest: Effort normal and breath sounds normal.  Abdominal: Soft. Normal appearance and bowel sounds are normal. He exhibits mass (RLQ, nontender). He exhibits no distension. There is no tenderness. There is no guarding.  Neurological:  Facial twitching on the left, sporadic  Skin: Skin is warm and dry. He is not diaphoretic. No pallor.  Psychiatric: He has a normal mood and affect. Cognition and memory are normal.    Results for orders placed or performed in visit on 07/08/15  Microscopic Examination  Result Value Ref Range   WBC, UA 0-5 0 -   5 /hpf   RBC, UA 0-2 0 -  2 /hpf   Epithelial Cells (non renal) 0-10 0 - 10 /hpf   Mucus, UA Present Not Estab.   Bacteria, UA Few None seen/Few  UA/M w/rflx Culture, Routine  Result Value Ref Range   Specific Gravity, UA 1.025 1.005 - 1.030   pH, UA 5.5 5.0 - 7.5   Color, UA Yellow Yellow   Appearance Ur Clear Clear   Leukocytes, UA Negative Negative   Protein, UA 2+ (A) Negative/Trace   Glucose, UA Negative Negative   Ketones, UA Negative Negative   RBC, UA Negative Negative   Bilirubin, UA Negative Negative   Urobilinogen, Ur 0.2 0.2 - 1.0 mg/dL   Nitrite, UA Negative Negative   Microscopic Examination See below:       Assessment & Plan:   Problem List Items Addressed This Visit      Cardiovascular and Mediastinum   Essential hypertension, benign    Controlled today      Relevant Medications   amLODipine (NORVASC) 5 MG tablet     Other   Diarrhea - Primary    Not sure if infectious source, or if blockage and this is all that can get by; RLQ mass/fullness is concerning; dicsussed with patient; stool studies ordered; close f/u      Relevant Orders   Ova and parasite examination   Stool Culture   Stool Giardia/Cryptosporidium   Comprehensive metabolic panel   Fecal leukocytes   TSH   CBC with Differential/Platelet   Celiac Disease Ab Screen w/Rfx   DG Abd Acute W/Chest (Completed)   RLQ abdominal mass    Nontender; will send patient now for AAS and then schedule CT scan; urine and bloodwork ordered; close f/u      Relevant Orders   UA/M w/rflx Culture, Routine (Completed)   CT Abdomen Pelvis W Contrast    Other Visit Diagnoses    Need for pneumococcal vaccination        PCV-13 given today; no further pneumonia vaccine needed per current ACIP guidelines    Relevant Orders    Pneumococcal conjugate vaccine 13-valent IM (Completed)        Follow up plan: Return 10-12 days, for follow-up, 15 minutes.  An after-visit summary was printed and given to the  patient at [place].  Please see the patient instructions which may contain other information and recommendations beyond what is mentioned above in the assessment and plan. Orders Placed This Encounter  Procedures  . Ova and parasite examination  . Stool Culture  . Stool Giardia/Cryptosporidium  . Fecal leukocytes  . Microscopic Examination  . DG Abd Acute W/Chest  . CT Abdomen Pelvis W Contrast  . Pneumococcal conjugate vaccine  13-valent IM  . Comprehensive metabolic panel  . TSH  . CBC with Differential/Platelet  . Celiac Disease Ab Screen w/Rfx  . UA/M w/rflx Culture, Routine

## 2015-07-08 NOTE — Assessment & Plan Note (Signed)
Not sure if infectious source, or if blockage and this is all that can get by; RLQ mass/fullness is concerning; dicsussed with patient; stool studies ordered; close f/u

## 2015-07-09 ENCOUNTER — Telehealth: Payer: Self-pay | Admitting: Family Medicine

## 2015-07-09 ENCOUNTER — Other Ambulatory Visit: Payer: Self-pay

## 2015-07-09 ENCOUNTER — Other Ambulatory Visit: Payer: Self-pay | Admitting: Family Medicine

## 2015-07-09 ENCOUNTER — Ambulatory Visit
Admission: RE | Admit: 2015-07-09 | Discharge: 2015-07-09 | Disposition: A | Discharge status: Home / Self Care | Payer: Medicare Other | Source: Ambulatory Visit | Attending: Family Medicine | Admitting: Family Medicine

## 2015-07-09 DIAGNOSIS — R197 Diarrhea, unspecified: Secondary | ICD-10-CM

## 2015-07-09 DIAGNOSIS — K5730 Diverticulosis of large intestine without perforation or abscess without bleeding: Secondary | ICD-10-CM | POA: Diagnosis not present

## 2015-07-09 DIAGNOSIS — R1903 Right lower quadrant abdominal swelling, mass and lump: Secondary | ICD-10-CM

## 2015-07-09 DIAGNOSIS — N281 Cyst of kidney, acquired: Secondary | ICD-10-CM | POA: Diagnosis not present

## 2015-07-09 DIAGNOSIS — E875 Hyperkalemia: Secondary | ICD-10-CM | POA: Diagnosis not present

## 2015-07-09 DIAGNOSIS — N200 Calculus of kidney: Secondary | ICD-10-CM | POA: Insufficient documentation

## 2015-07-09 DIAGNOSIS — N2889 Other specified disorders of kidney and ureter: Secondary | ICD-10-CM | POA: Insufficient documentation

## 2015-07-09 DIAGNOSIS — K5669 Other intestinal obstruction: Secondary | ICD-10-CM | POA: Diagnosis not present

## 2015-07-09 LAB — BASIC METABOLIC PANEL
Anion gap: 5 (ref 5–15)
BUN: 23 mg/dL — ABNORMAL HIGH (ref 6–20)
CO2: 26 mmol/L (ref 22–32)
Calcium: 9.2 mg/dL (ref 8.9–10.3)
Chloride: 108 mmol/L (ref 101–111)
Creatinine, Ser: 1.12 mg/dL (ref 0.61–1.24)
GFR calc Af Amer: >60 mL/min (ref >60)
GFR calc non Af Amer: >60 mL/min (ref >60)
Glucose, Bld: 114 mg/dL — ABNORMAL HIGH (ref 65–99)
Potassium: 4.9 mmol/L (ref 3.5–5.1)
Sodium: 139 mmol/L (ref 135–145)

## 2015-07-09 MED ORDER — IOHEXOL 300 MG/ML  SOLN
100.0000 mL | Freq: Once | INTRAMUSCULAR | Status: AC | PRN
  Administered 2015-07-09: 100 mL via INTRAVENOUS

## 2015-07-09 NOTE — Progress Notes (Signed)
Release giardia/crypto for [facility] please

## 2015-07-09 NOTE — Assessment & Plan Note (Signed)
Lab

## 2015-07-09 NOTE — Telephone Encounter (Signed)
I talked to patient; suspected mass in the RLQ; may be physical obstruction causing only loose stool to get by, so we have lots of things to consider in this ddx; need to get CT scan NPO from now until he hears from staff about CT scan; we need to get that today; his K+ was high, so I need a BMP today at the hospital as well KERI--> please get the CT scan ordered today and send patient for CT and BMP recheck; he can eat/drink after the scan

## 2015-07-09 NOTE — Assessment & Plan Note (Signed)
labs

## 2015-07-09 NOTE — Assessment & Plan Note (Signed)
Check K today  

## 2015-07-09 NOTE — Telephone Encounter (Signed)
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

## 2015-07-10 ENCOUNTER — Encounter: Payer: Self-pay | Admitting: Urology

## 2015-07-10 ENCOUNTER — Ambulatory Visit (INDEPENDENT_AMBULATORY_CARE_PROVIDER_SITE_OTHER): Payer: Medicare Other | Admitting: Urology

## 2015-07-10 VITALS — BP 134/65 | HR 61 | Ht 71.0 in | Wt 195.8 lb

## 2015-07-10 DIAGNOSIS — N281 Cyst of kidney, acquired: Secondary | ICD-10-CM

## 2015-07-10 DIAGNOSIS — N2889 Other specified disorders of kidney and ureter: Secondary | ICD-10-CM

## 2015-07-10 DIAGNOSIS — Q6100 Congenital renal cyst, unspecified: Secondary | ICD-10-CM | POA: Diagnosis not present

## 2015-07-10 DIAGNOSIS — N400 Enlarged prostate without lower urinary tract symptoms: Secondary | ICD-10-CM | POA: Diagnosis not present

## 2015-07-10 LAB — URINALYSIS, COMPLETE
Bilirubin, UA: NEGATIVE
Glucose, UA: NEGATIVE
Ketones, UA: NEGATIVE
Leukocytes, UA: NEGATIVE
Nitrite, UA: NEGATIVE
Specific Gravity, UA: 1.025 (ref 1.005–1.030)
Urobilinogen, Ur: 0.2 mg/dL (ref 0.2–1.0)
pH, UA: 5 (ref 5.0–7.5)

## 2015-07-10 LAB — MICROSCOPIC EXAMINATION
Epithelial Cells (non renal): NONE SEEN /hpf (ref 0–10)
RBC, UA: NONE SEEN /hpf (ref 0–?)

## 2015-07-10 NOTE — Progress Notes (Signed)
-  07/10/2015 4:15 PM   [name] 09-21-1940 [id number]  Referring provider: Arnetha Courser, MD [place], [place] [address]  Chief Complaint  Patient presents with  . renal mass    referred by Dr. Jeannine Kitten    HPI: The patient is a 75 year old male who presents for evaluation of a large 17 cm right lower pole renal cyst. He also has smaller cysts bilaterally. The patient was having diarrhea symptoms and a CT scan was ordered which revealed this mass. He does note fullness in his right abdomen. He isfor a number of years. He does not find it very bothersome. He has never seen a urologist before. He has never had hematuria or nephrolithiasis to his knowledge. He does take tamsulosin for BPH.  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  PMH: Past Medical History  Diagnosis Date  . Hypertension   . Prostate hypertrophy   . GERD (gastroesophageal reflux disease)   . Arthritis   . 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 Hyperlipidemia   . Myelofibrosis ([place])   . History of retinal detachment   . Cancer (HCC)     hx of melanoma  . Heartburn     Surgical History: Past Surgical History  Procedure Laterality Date  . Varicose vein surgery    . Back surgery      approx 20- 25 years ago  . Scleral buckle  08/11/2011    Procedure: SCLERAL BUCKLE;  Surgeon: Hayden Pedro, MD;  Location: [facility];  Service: Ophthalmology;  Laterality: Right;  . Gas insertion  08/11/2011    Procedure: INSERTION OF GAS;  Surgeon: Hayden Pedro, MD;  Location: [facility];  Service: Ophthalmology;  Laterality: Right;  [id number]    Home Medications:    Medication List       This list is accurate as of: 07/10/15  4:15 PM.  Always use your most recent med list.               amLODipine 5 MG tablet  Commonly known as:  NORVASC  Take 5 mg by mouth daily. Reported on 07/10/2015     atorvastatin 10 MG tablet  Commonly known as:  LIPITOR  TAKE ONE TABLET AT  BEDTIME     BENICAR 40 MG tablet  Generic drug:  olmesartan  Take 40 mg by mouth daily.     CALCIUM 600 PO  Take 600 mg by mouth 2 (two) times daily.     clopidogrel 75 MG tablet  Commonly known as:  PLAVIX  TAKE ONE TABLET BY MOUTH EVERY DAY     gabapentin 300 MG capsule  Commonly known as:  NEURONTIN  TAKE 1 CAPSULE 3 TIMES DAILY     Ginseng 100 MG Tabs  Take 200 mg by mouth daily.     methocarbamol 500 MG tablet  Commonly known as:  ROBAXIN  Take 500 mg by mouth daily.     multivitamin with minerals Tabs tablet  Take 1 tablet by mouth daily.     omega-3 acid ethyl esters 1 g capsule  Commonly known as:  LOVAZA  Take 1 g by mouth 2 (two) times daily.     ranitidine 300 MG tablet  Commonly known as:  ZANTAC  Take 300 mg by mouth at bedtime.     tamsulosin 0.4 MG Caps capsule  Commonly known as:  FLOMAX  Take 1 capsule (0.4 mg total) by mouth daily.     Vitamin D3 1000 units Caps  Take 1,000 Units by mouth daily.     VOLTAREN 1 % Gel  Generic drug:  diclofenac sodium  Apply 1 application topically as needed. Reported on 07/10/2015     zolpidem 10 MG tablet  Commonly known as:  AMBIEN  Take 10 mg by mouth at bedtime as needed. Reported on 07/10/2015        Allergies:  Allergies  Allergen Reactions  . Meloxicam Nausea And Vomiting    Just not feeling good when he took Mobic    Family History: Family History  Problem Relation Age of Onset  . Heart disease Father   . Diabetes Son   . Kidney disease Neg Hx   . Prostate cancer Neg Hx     Social History:  reports that he has quit smoking. He has never used smokeless tobacco. He reports that he does not drink alcohol or use illicit drugs.  ROS: UROLOGY Frequent Urination?: No Hard to postpone urination?: No Burning/pain with urination?: No Get up at night to urinate?: No Leakage of urine?: No Urine stream starts and stops?: Yes Trouble starting stream?: No Do you have to strain to urinate?:  No Blood in urine?: No Urinary tract infection?: No Sexually transmitted disease?: No Injury to kidneys or bladder?: No Painful intercourse?: No Weak stream?: Yes Erection problems?: Yes Penile pain?: No  Gastrointestinal Nausea?: No Vomiting?: No Indigestion/heartburn?: Yes Diarrhea?: Yes Constipation?: No  Constitutional Fever: No Night sweats?: No Weight loss?: No Fatigue?: No  Skin Skin rash/lesions?: No Itching?: Yes  Eyes Blurred vision?: No Double vision?: No  Ears/Nose/Throat Sore throat?: No Sinus problems?: No  Hematologic/Lymphatic Swollen glands?: No Easy bruising?: Yes  Cardiovascular Leg swelling?: No Chest pain?: No  Respiratory Cough?: No Shortness of breath?: No  Endocrine Excessive thirst?: No  Musculoskeletal Back pain?: Yes Joint pain?: Yes  Neurological Headaches?: No Dizziness?: No  Psychologic Depression?: No Anxiety?: No  Physical Exam: BP 134/65 mmHg  Pulse 61  Ht 5\' 11"  (1.803 m)  Wt 195 lb 12.8 oz (88.814 kg)  BMI 27.32 kg/m2  Constitutional:  Alert and oriented, No acute distress. HEENT: [place] AT, moist mucus membranes.  Trachea midline, no masses. Cardiovascular: No clubbing, cyanosis, or edema. Respiratory: Normal respiratory effort, no increased work of breathing. GI: Abdomen is soft, nontender, nondistended, no abdominal masses GU: No CVA tenderness.  Skin: No rashes, bruises or suspicious lesions. Lymph: No cervical or inguinal adenopathy. Neurologic: Grossly intact, no focal deficits, moving all 4 extremities. Psychiatric: Normal mood and affect.  Laboratory Data: Lab Results  Component Value Date   WBC 4.5 07/08/2015   HGB 10.5* 06/12/2015   HCT 29.9* 07/08/2015   MCV 91 07/08/2015   PLT 300 07/08/2015    Lab Results  Component Value Date   CREATININE 1.12 07/09/2015    No results found for: PSA  No results found for: TESTOSTERONE  Lab Results  Component Value Date   HGBA1C  11/21/2007     5.9 (NOTE)   The ADA recommends the following therapeutic goals for glycemic   control related to Hgb A1C measurement:   Goal of Therapy:   < 7.0% Hgb A1C   Action Suggested:  > 8.0% Hgb A1C   Ref:  Diabetes Care, 22, Suppl. 1, 1999    Urinalysis    Component Value Date/Time   GLUCOSEU Negative 07/08/2015 1126   BILIRUBINUR Negative 07/08/2015 1126  NITRITE Negative 07/08/2015 1126   LEUKOCYTESUR Negative 07/08/2015 1126    Pertinent Imaging: CLINICAL DATA: Right lower quadrant mass, diarrhea, bowel incontinence  EXAM: CT ABDOMEN AND PELVIS WITH CONTRAST  TECHNIQUE: Multidetector CT imaging of the abdomen and pelvis was performed using the standard protocol following bolus administration of intravenous contrast.  CONTRAST: 158mL OMNIPAQUE IOHEXOL 300 MG/ML SOLN  COMPARISON: None.  FINDINGS: The lung bases are clear. The liver enhances with no focal abnormality and no ductal dilatation is seen. No calcified gallstones are noted. The pancreas is unremarkable and the pancreatic duct is not dilated. The adrenal glands and spleen are unremarkable although the spleen is slightly prominent. The kidneys enhance and there are multiple renal cysts present. The largest cyst emanates from the lower pole of the right kidney and extends anteriorly most likely accounting for the palpable abnormality. This right lower pole renal cyst measures 17.3 x 12.4 cm. There are few foci of faint calcification and focal soft tissue within the wall of this cyst making this cyst slightly complex. Minimal septation is noted in the inferior aspect of the cyst laterally. No solid renal lesion is seen. Small bilateral nonobstructing renal calculi are noted. The proximal ureters are normal in caliber. The abdominal aorta is normal in caliber with moderate atherosclerotic change present. No adenopathy is seen.  The urinary bladder is not well distended but no abnormality is seen. The  prostate is normal in size for age. 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 There is feces throughout the colon. The cecum is somewhat displaced cephalad by the larger right lower pole renal cyst. The lumbar vertebrae are normal alignment. There is degenerative disc disease primarily at L4-5.  IMPRESSION: 1. The questioned mass on physical exam represents a large right lower pole renal cyst which is slightly complex with some peripheral soft tissue component, septation, and mural calcification. Multiple bilateral renal cysts also are noted. 2. Nonobstructing bilateral renal calculi. 3. 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  Assessment & Plan:    I had a long discussion with the patient regarding his 17 cm left renal cyst. It appears to be a Bosniak 2 cyst. I discussed with the patient that has less than 5% chance of being malignant and that if it was smaller no follow up imaging or intervention would be required. Due to the large size, I discussed treatment options with him which include laparoscopic decortication versus drainage by interventional radiology. He is symptomatic from this mass as it causing right abdominal fullness as well as displacing his intestines significantly to the left.  It also is at risk for rupture from relatively minor trauma. He understands that the definitive treatment is laparoscopic cyst decortication as this would prevent the cyst from recurring. However this is an operation that would limit his activity for extended period of time while he recovers. I discussed drainage by interventional radiology would decompress the cyst, but it would inevitably come back, and it is difficult to predict how long it would take to recur. The patient is very concerned as he is self-employed and nearing retirement. 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 Due  to his concerns about his job, he has elected to undergo drainage by interventional radiology. He again is aware that at some point it is likely to recur. He will consider surgical decortication at that time. He'll follow-up in one to 2 weeks after his cyst drainage  interventional radiology.  1. Large right lower pole 17 cm renal cyst -The patient will be referred to interventional radiology for drainage of the cyst -He will follow-up with me a few weeks after drainage. Next  2. Bilateral simple smaller renal cyst Benign. No further workup needed.  3. BPH Continue Flomax per PCP   Return for 2 weeks after IR drainage of renal cyst.  Nickie Retort, [place] [address], [place], [place] [address] [phone]

## 2015-07-13 ENCOUNTER — Encounter: Payer: Self-pay | Admitting: Hematology and Oncology

## 2015-07-13 ENCOUNTER — Ambulatory Visit (INDEPENDENT_AMBULATORY_CARE_PROVIDER_SITE_OTHER): Payer: Medicare Other | Admitting: Ophthalmology

## 2015-07-13 DIAGNOSIS — H33302 Unspecified retinal break, left eye: Secondary | ICD-10-CM

## 2015-07-13 DIAGNOSIS — I10 Essential (primary) hypertension: Secondary | ICD-10-CM

## 2015-07-13 DIAGNOSIS — H26492 Other secondary cataract, left eye: Secondary | ICD-10-CM | POA: Diagnosis not present

## 2015-07-13 DIAGNOSIS — H35033 Hypertensive retinopathy, bilateral: Secondary | ICD-10-CM

## 2015-07-13 DIAGNOSIS — H43813 Vitreous degeneration, bilateral: Secondary | ICD-10-CM | POA: Diagnosis not present

## 2015-07-13 DIAGNOSIS — H338 Other retinal detachments: Secondary | ICD-10-CM

## 2015-07-13 LAB — CBC WITH DIFFERENTIAL/PLATELET
Basophils Absolute: 0 10*3/uL (ref 0.0–0.2)
Basos: 0 %
EOS (ABSOLUTE): 0 10*3/uL (ref 0.0–0.4)
Eos: 1 %
Hematocrit: 29.9 % — ABNORMAL LOW (ref 37.5–51.0)
Hemoglobin: 9.8 g/dL — ABNORMAL LOW (ref 12.6–17.7)
Immature Grans (Abs): 0.2 10*3/uL — ABNORMAL HIGH (ref 0.0–0.1)
Immature Granulocytes: 4 %
Lymphocytes Absolute: 0.9 10*3/uL (ref 0.7–3.1)
Lymphs: 20 %
MCH: 29.7 pg (ref 26.6–33.0)
MCHC: 32.8 g/dL (ref 31.5–35.7)
MCV: 91 fL (ref 79–97)
Monocytes Absolute: 0.6 10*3/uL (ref 0.1–0.9)
Monocytes: 13 %
Neutrophils Absolute: 2.8 10*3/uL (ref 1.4–7.0)
Neutrophils: 62 %
Platelets: 300 10*3/uL (ref 150–379)
RBC: 3.3 x10E6/uL — ABNORMAL LOW (ref 4.14–5.80)
RDW: 17.5 % — ABNORMAL HIGH (ref 12.3–15.4)
WBC: 4.5 10*3/uL (ref 3.4–10.8)

## 2015-07-13 LAB — COMPREHENSIVE METABOLIC PANEL
ALT: 30 IU/L (ref 0–44)
AST: 29 IU/L (ref 0–40)
Albumin/Globulin Ratio: 2.3 (ref 1.1–2.5)
Albumin: 4.4 g/dL (ref 3.5–4.8)
Alkaline Phosphatase: 105 IU/L (ref 39–117)
BUN/Creatinine Ratio: 17 (ref 10–22)
BUN: 18 mg/dL (ref 8–27)
Bilirubin Total: 0.8 mg/dL (ref 0.0–1.2)
CO2: 22 mmol/L (ref 18–29)
Calcium: 9.6 mg/dL (ref 8.6–10.2)
Chloride: 102 mmol/L (ref 96–106)
Creatinine, Ser: 1.09 mg/dL (ref 0.76–1.27)
GFR calc Af Amer: 77 mL/min/{1.73_m2} (ref >59)
GFR calc non Af Amer: 67 mL/min/{1.73_m2} (ref >59)
Globulin, Total: 1.9 g/dL (ref 1.5–4.5)
Glucose: 99 mg/dL (ref 65–99)
Potassium: 5.8 mmol/L — ABNORMAL HIGH (ref 3.5–5.2)
Sodium: 141 mmol/L (ref 134–144)
Total Protein: 6.3 g/dL (ref 6.0–8.5)

## 2015-07-13 LAB — CELIAC DISEASE AB SCREEN W/RFX
Antigliadin Abs, IgA: 2 units (ref 0–19)
IgA/Immunoglobulin A, Serum: 93 mg/dL (ref 61–437)
Transglutaminase IgA: <2 U/mL (ref 0–3)

## 2015-07-13 LAB — STOOL CULTURE: E coli, Shiga toxin Assay: NEGATIVE

## 2015-07-13 LAB — TSH: TSH: 2.7 u[IU]/mL (ref 0.450–4.500)

## 2015-07-13 NOTE — Telephone Encounter (Signed)
Potassium back to normal.

## 2015-07-13 NOTE — Telephone Encounter (Signed)
I need the BMP results that were supposed to have been done last week please

## 2015-07-13 NOTE — Telephone Encounter (Signed)
The results are under the lab tab from 07-09-2015 in chart review. The encounter is a hospital encounter, because he got it drawn at the hospital like you asked.   Basic metabolic panel  Status: Finalresult Visible to patient:  Not Released Nextappt: Today at 01:30 PM in Ophthalmology (MATTHEWS, Chrystie Nose, MD) Dx:  RLQ abdominal mass              Ref Range 4d ago  5d ago  55moago  476mogo     Sodium 135 - 145 mmol/L 139 141R 140 143R    Potassium 3.5 - 5.1 mmol/L 4.9 5.8 (H)R 4.7 5.1R    Chloride 101 - 111 mmol/L 108 102R 111 106R    CO2 22 - 32 mmol/L 26 22R 25 23R    Glucose, Bld 65 - 99 mg/dL 114 (H) 99 96 114 (H)    BUN 6 - 20 mg/dL 23 (H) 18R 27 (H) 20R    Creatinine, Ser 0.61 - 1.24 mg/dL 1.12 1.09R 1.07 0.98R    Calcium 8.9 - 10.3 mg/dL 9.2 9.6R 8.7 (L) 9.3R    GFR calc non Af Amer >60 mL/min >60 59 mL/min/1.73" class="[email or web address]" style="cursor: pointer; background-color: rgb(225, 231, 236);" onmouseover='jscript: var varStyle="underline"; var bgColor="#[id number]"; this.style.backgroundColor=bgColor; var children=this.getElementsByTagName("div"); for(var child=0;child 67R 60 mL/min" class="[email or web address]" style="cursor: pointer; background-color: rgb(225, 231, 236);" onmouseover='jscript: var varStyle="underline"; var bgColor="#[id number]"; this.style.backgroundColor=bgColor; var children=this.getElementsByTagName("div"); for(var child=0;child >60 59 mL/min/1.73" class="rz_17" style="cursor: pointer;" onmouseover='jscript: var varStyle="underline"; var bgColor="#[id number]"; this.style.backgroundColor=bgColor; var children=this.getElementsByTagName("div"); for(var child=0;child 76R    GFR calc Af Amer >60 mL/min >60 59 mL/min/1.73" class="[email or web address]" style="cursor: pointer; background-color: rgb(225, 231, 236);" onmouseover='jscript: var varStyle="underline"; var bgColor="#[id number]"; this.style.backgroundColor=bgColor; var children=this.getElementsByTagName("div"); for(var  child=0;child 77R 60 mL/min" class="rz_16" style="cursor: pointer; background-color: rgb(225, 231, 236);" onmouseover='jscript: var varStyle="underline"; var bgColor="#[id number]"; this.style.backgroundColor=bgColor; var children=this.getElementsByTagName("div"); for(var child=0;child >60CM 59 mL/min/1.73" class="rz_17" style="cursor: pointer;" onmouseover='jscript: var varStyle="underline"; var bgColor="#[id number]"; this.style.backgroundColor=bgColor; var children=this.getElementsByTagName("div"); for(var child=0;child 87R   Comments: (NOTE)  The eGFR has been calculated using the CKD EPI equation.  This calculation has not been validated in all clinical situations.  eGFR's persistently <60 mL/min signify possible Chronic Kidney  Disease.      Anion gap 5 - _0 (L)    Resulting Agency  [facility]      Specimen Collected: 07/09/15 10:43 AM Last Resulted: 07/09/15 11:00 AM             CM=Additional commentsR=Reference range differs from displayed range

## 2015-07-14 ENCOUNTER — Telehealth: Payer: Self-pay | Admitting: Family Medicine

## 2015-07-14 ENCOUNTER — Other Ambulatory Visit: Payer: Self-pay | Admitting: Urology

## 2015-07-14 ENCOUNTER — Telehealth: Payer: Self-pay | Admitting: Radiology

## 2015-07-14 LAB — OVA AND PARASITE EXAMINATION

## 2015-07-14 NOTE — Telephone Encounter (Signed)
He has a very large right renal cyst and they are going to attempt to drain it percuteously; see urologist's note

## 2015-07-14 NOTE — Telephone Encounter (Signed)
Amy from [facility] called stated pt is having a procedure 07/20/15 and needs ok for pt to stop Plavix for 5 days. Please call and advise if this is ok. Thanks.

## 2015-07-14 NOTE — Telephone Encounter (Signed)
Note received from anesthesia regarding Plavix; they want to hold Plavix x 5 days and voltaren for 3 days prior to procedure Dr. Mike Gip, Do you agree with this plan? Thank you, Dr. Sanda Klein

## 2015-07-14 NOTE — Telephone Encounter (Signed)
  Thanks for keeping me in the loop about Mr. Covin.  Melissa

## 2015-07-14 NOTE — Telephone Encounter (Signed)
Pt notified of US guided aspiration of renal cyst scheduled for 07/20/15, to arrive at the [facility] at 8:00am, to be npo after mn except for taking Benicar & amlodipine with a small sip of water. Pt voices understanding. Advised pt that we need to get clearance for him to stop his Plavix prior to the procedure. Pt states he "knows how this works so (he) stopped taking Plavix" on 07/13/15. Advised pt that he should not stop this medication without approval from his physician.

## 2015-07-14 NOTE — Telephone Encounter (Signed)
I just spoke with wife; she says the neurologist has given his blessing in the past to stop the Plavix and they have already stopped it, as a matter of fact I'll sign off on the order to hold plavix x 5 days and voltaren x 3 days Just want to keep you in the loop

## 2015-07-15 NOTE — Addendum Note (Signed)
Addended by: Kerry Hough on: 07/15/2015 08:39 AM   Modules accepted: Orders

## 2015-07-15 NOTE — Telephone Encounter (Signed)
Permission form was faxed back to them this morning.

## 2015-07-16 LAB — GIARDIA/CRYPTOSPORIDIUM EIA
Cryptosporidium EIA: NEGATIVE
Giardia Ag, Stl: NEGATIVE

## 2015-07-16 LAB — SPECIMEN STATUS REPORT

## 2015-07-17 ENCOUNTER — Other Ambulatory Visit: Payer: Self-pay | Admitting: Radiology

## 2015-07-17 ENCOUNTER — Other Ambulatory Visit: Payer: Self-pay | Admitting: Family Medicine

## 2015-07-17 NOTE — Telephone Encounter (Signed)
Most recent ALT was normal, Feb 2017; last lipid panel reviewed; Rx approved

## 2015-07-20 ENCOUNTER — Ambulatory Visit
Admission: RE | Admit: 2015-07-20 | Discharge: 2015-07-20 | Disposition: A | Discharge status: Home / Self Care | Payer: Medicare Other | Source: Ambulatory Visit | Attending: Urology | Admitting: Urology

## 2015-07-20 ENCOUNTER — Ambulatory Visit: Payer: Medicare Other | Admitting: Family Medicine

## 2015-07-20 DIAGNOSIS — Q6100 Congenital renal cyst, unspecified: Secondary | ICD-10-CM | POA: Diagnosis not present

## 2015-07-20 DIAGNOSIS — N281 Cyst of kidney, acquired: Secondary | ICD-10-CM

## 2015-07-20 LAB — CBC WITH DIFFERENTIAL/PLATELET
Basophils Absolute: 0 10*3/uL (ref 0–0.1)
Basophils Relative: 2 %
Eosinophils Absolute: 0 10*3/uL (ref 0–0.7)
Eosinophils Relative: 1 %
HCT: 30.5 % — ABNORMAL LOW (ref 40.0–52.0)
Hemoglobin: 10.1 g/dL — ABNORMAL LOW (ref 13.0–18.0)
Lymphocytes Relative: 29 %
Lymphs Abs: 0.8 10*3/uL — ABNORMAL LOW (ref 1.0–3.6)
MCH: 30.2 pg (ref 26.0–34.0)
MCHC: 33 g/dL (ref 32.0–36.0)
MCV: 91.5 fL (ref 80.0–100.0)
Monocytes Absolute: 0.3 10*3/uL (ref 0.2–1.0)
Monocytes Relative: 11 %
Neutro Abs: 1.5 10*3/uL (ref 1.4–6.5)
Neutrophils Relative %: 57 %
Platelets: 259 10*3/uL (ref 150–440)
RBC: 3.34 MIL/uL — ABNORMAL LOW (ref 4.40–5.90)
RDW: 18.3 % — ABNORMAL HIGH (ref 11.5–14.5)
WBC: 2.7 10*3/uL — ABNORMAL LOW (ref 3.8–10.6)

## 2015-07-20 LAB — BASIC METABOLIC PANEL
Anion gap: 2 — ABNORMAL LOW (ref 5–15)
BUN: 22 mg/dL — ABNORMAL HIGH (ref 6–20)
CO2: 28 mmol/L (ref 22–32)
Calcium: 9.1 mg/dL (ref 8.9–10.3)
Chloride: 112 mmol/L — ABNORMAL HIGH (ref 101–111)
Creatinine, Ser: 0.91 mg/dL (ref 0.61–1.24)
GFR calc Af Amer: >60 mL/min (ref >60)
GFR calc non Af Amer: >60 mL/min (ref >60)
Glucose, Bld: 121 mg/dL — ABNORMAL HIGH (ref 65–99)
Potassium: 4.3 mmol/L (ref 3.5–5.1)
Sodium: 142 mmol/L (ref 135–145)

## 2015-07-20 LAB — PROTIME-INR
INR: 1.11
Prothrombin Time: 14.5 seconds (ref 11.4–15.0)

## 2015-07-20 LAB — APTT: aPTT: 33 seconds (ref 24–36)

## 2015-07-20 MED ORDER — SODIUM CHLORIDE 0.9 % IV SOLN
INTRAVENOUS | Status: DC
  Administered 2015-07-20: 09:00:00 via INTRAVENOUS

## 2015-07-20 MED ORDER — MIDAZOLAM HCL 5 MG/5ML IJ SOLN
INTRAMUSCULAR | Status: AC | PRN
  Administered 2015-07-20: 1 mg via INTRAVENOUS

## 2015-07-20 MED ORDER — FENTANYL CITRATE (PF) 100 MCG/2ML IJ SOLN
INTRAMUSCULAR | Status: AC | PRN
  Administered 2015-07-20: 50 ug via INTRAVENOUS

## 2015-07-20 NOTE — Procedures (Signed)
Under US guidance, 150 mls of clear fluid drained from large right renal cyst. No immediate complications.

## 2015-07-20 NOTE — Procedures (Signed)
Procedure and risks discussed with patient and wife and informed consent obtained. Will perform US-guided drainage of right renal cyst.

## 2015-07-20 NOTE — OR Nursing (Signed)
Returned from [place] second time, denies complaints. bandaids x 2 dry and intact. deneis complaints

## 2015-07-22 LAB — CYTOLOGY - NON PAP

## 2015-07-22 LAB — FECAL LEUKOCYTES

## 2015-07-24 ENCOUNTER — Ambulatory Visit: Payer: Medicare Other

## 2015-07-29 ENCOUNTER — Ambulatory Visit (INDEPENDENT_AMBULATORY_CARE_PROVIDER_SITE_OTHER): Payer: Medicare Other | Admitting: Ophthalmology

## 2015-07-29 DIAGNOSIS — H2702 Aphakia, left eye: Secondary | ICD-10-CM

## 2015-08-03 ENCOUNTER — Ambulatory Visit (INDEPENDENT_AMBULATORY_CARE_PROVIDER_SITE_OTHER): Payer: Medicare Other | Admitting: Urology

## 2015-08-03 ENCOUNTER — Encounter: Payer: Self-pay | Admitting: Urology

## 2015-08-03 VITALS — BP 114/60 | HR 67 | Ht 71.0 in | Wt 192.0 lb

## 2015-08-03 DIAGNOSIS — Q6100 Congenital renal cyst, unspecified: Secondary | ICD-10-CM

## 2015-08-03 DIAGNOSIS — N281 Cyst of kidney, acquired: Secondary | ICD-10-CM

## 2015-08-03 NOTE — Progress Notes (Signed)
Patient returns in continued management of a large right lower pole renal cyst, 17 cm. The cyst was noted on a 2015 ultrasound and a CT scan earlier this month. He was having abdominal fullness and diarrhea. There is also about a 7 cm right upper pole cyst as well as a left renal cyst. After considering his options he underwent cyst aspiration 07/20/2015. Initially 150 mL of fluid was drained and it was apparent the smaller upper pole cyst had been aspirated. Therefore he was brought back and the larger cyst aspirated and 1600 mL of straw-colored fluid was drained. The cytology for both was noted to be normal with only some macrophages and granular degrees but of course the parenchyma was not sampled. I reviewed the images.  Today, he is well. He has had no flank pain, dysuria or gross hematuria. In fact he inspected 2 houses after the cyst aspiration.  A/P - renal cyst - s/p aspiration. F/u 3 months with U/s. Discussed again possibility of recurrence.

## 2015-08-19 ENCOUNTER — Telehealth: Payer: Self-pay | Admitting: Family Medicine

## 2015-08-19 DIAGNOSIS — M1712 Unilateral primary osteoarthritis, left knee: Secondary | ICD-10-CM

## 2015-08-19 NOTE — Telephone Encounter (Signed)
Xray and referral for knee pain

## 2015-08-19 NOTE — Assessment & Plan Note (Signed)
Check xray and refer

## 2015-08-27 ENCOUNTER — Other Ambulatory Visit: Payer: Self-pay | Admitting: Family Medicine

## 2015-08-31 ENCOUNTER — Encounter: Payer: Self-pay | Admitting: Family Medicine

## 2015-08-31 ENCOUNTER — Ambulatory Visit (INDEPENDENT_AMBULATORY_CARE_PROVIDER_SITE_OTHER): Payer: Medicare Other | Admitting: Family Medicine

## 2015-08-31 VITALS — BP 141/77 | HR 68 | Temp 97.4°F | Ht 68.0 in | Wt 192.0 lb

## 2015-08-31 DIAGNOSIS — M1712 Unilateral primary osteoarthritis, left knee: Secondary | ICD-10-CM | POA: Diagnosis not present

## 2015-08-31 DIAGNOSIS — D474 Osteomyelofibrosis: Secondary | ICD-10-CM | POA: Diagnosis not present

## 2015-08-31 DIAGNOSIS — M542 Cervicalgia: Secondary | ICD-10-CM | POA: Diagnosis not present

## 2015-08-31 DIAGNOSIS — I10 Essential (primary) hypertension: Secondary | ICD-10-CM

## 2015-08-31 DIAGNOSIS — D7581 Myelofibrosis: Secondary | ICD-10-CM

## 2015-08-31 DIAGNOSIS — E785 Hyperlipidemia, unspecified: Secondary | ICD-10-CM | POA: Diagnosis not present

## 2015-08-31 NOTE — Assessment & Plan Note (Signed)
Refer to PT; already had MRI, and symptoms about the same, let me know if symptoms worsen

## 2015-08-31 NOTE — Patient Instructions (Addendum)
We'll check labs today We'll refer you to a therapist about your neck and you can schedule the actual appointment to fit your busy schedule Your goal blood pressure is less than 150 mmHg on top. Try to follow the DASH guidelines (DASH stands for Dietary Approaches to Stop Hypertension) Try to limit the sodium in your diet.  Ideally, consume less than 1.5 grams (less than 1,500mg ) per day. Do not add salt when cooking or at the table.  Check the sodium amount on labels when shopping, and choose items lower in sodium when given a choice. Avoid or limit foods that already contain a lot of sodium. Eat a diet rich in fruits and vegetables and whole grains. Try to limit saturated fats in your diet (bologna, hot dogs, barbeque, cheeseburgers, hamburgers, steak, bacon, sausage, cheese, etc.) and get more fresh fruits, vegetables, and whole grains  Cholesterol Cholesterol is a white, waxy, fat-like substance needed by your body in small amounts. The liver makes all the cholesterol you need. Cholesterol is carried from the liver by the blood through the blood vessels. Deposits of cholesterol (plaque) may build up on blood vessel walls. These make the arteries narrower and stiffer. Cholesterol plaques increase the risk for heart attack and stroke.  You cannot feel your cholesterol level even if it is very high. The only way to know it is high is with a blood test. Once you know your cholesterol levels, you should keep a record of the test results. Work with your health care provider to keep your levels in the desired range.  WHAT DO THE RESULTS MEAN?  Total cholesterol is a rough measure of all the cholesterol in your blood.   LDL is the so-called bad cholesterol. This is the type that deposits cholesterol in the walls of the arteries. You want this level to be low.   HDL is the good cholesterol because it cleans the arteries and carries the LDL away. You want this level to be high.  Triglycerides are fat  that the body can either burn for energy or store. High levels are closely linked to heart disease.  WHAT ARE THE DESIRED LEVELS OF CHOLESTEROL?  Total cholesterol below 200.   LDL below 100 for people at risk, below 70 for those at very high risk.   HDL above 50 is good, above 60 is best.   Triglycerides below 150.  HOW CAN I LOWER MY CHOLESTEROL?  Diet. Follow your diet programs as directed by your health care provider.   Choose fish or white meat chicken and [place], roasted or baked. Limit fatty cuts of red meat, fried foods, and processed meats, such as sausage and lunch meats.   Eat lots of fresh fruits and vegetables.  Choose whole grains, beans, pasta, potatoes, and cereals.   Use only small amounts of olive, corn, or canola oils.   Avoid butter, mayonnaise, shortening, or palm kernel oils.  Avoid foods with trans fats.   Drink skim or nonfat milk and eat low-fat or nonfat yogurt and cheeses. Avoid whole milk, cream, ice cream, egg yolks, and full-fat cheeses.   Healthy desserts include angel food cake, ginger snaps, animal crackers, hard candy, popsicles, and low-fat or nonfat frozen yogurt. Avoid pastries, cakes, pies, and cookies.   Exercise. Follow your exercise programs as directed by your health care provider.   A regular program helps decrease LDL and raise HDL.   A regular program helps with weight control.   Do things that increase your  activity level like gardening, walking, or taking the stairs. Ask your health care provider about how you can be more active in your daily life.   Medicine. Take medicine only as directed by your health care provider.   Medicine may be prescribed by your health care provider to help lower cholesterol and decrease the risk for heart disease.   If you have several risk factors, you may need medicine even if your levels are normal.   This information is not intended to replace advice given to you by your  health care provider. Make sure you discuss any questions you have with your health care provider.   Document Released: 02/15/2001 Document Revised: 06/13/2014 Document Reviewed: 03/06/2013 Elsevier Interactive Patient Education [employer].

## 2015-08-31 NOTE — Progress Notes (Signed)
BP 141/77 mmHg  Pulse 68  Temp(Src) 97.4 F (36.3 C)  Ht 5\' 8"  (1.727 m)  Wt 192 lb (87.091 kg)  BMI 29.20 kg/m2  SpO2 97%   Subjective:    Patient ID: [name], male    DOB: 08-15-1940, 75 y.o.   MRN: [id number]  HPI: [name] is a 75 y.o. male  Chief Complaint  Patient presents with  . Hypertension    follow up  . Hyperlipidemia    follow up  . Neck Pain    he's still having neck pain, would like a refill on voltaren gel and maybe a referral to PT   Has a bulging disk in the neck; has numbness in the left arm; disk pushing out; doesn't want surgery; interested in PT; has never done PT for that; crunches and pops in the neck; he will rub it and put on ice on it; helps for a little bit; taking two naproxen every morning and it really helps; taking H2 blocker; no abd pain and no blood in stool  Hypertension; not checking at home; does use some salt, but will try to cut back; not many fatty meats, just once in a while  High cholesterol; eats one big meal at dinner; not many eggs; taking statin, no problems  Myelodysplasia; followed at [facility]; his counts got better last check, hgb over 10 last time  Had large renal cysts drained; is going to have this re-imaged soon to see if growing back; he is relieved at outcome, feeling better; no more diarrhea  Relevant past medical, surgical, family and social history reviewed and updated as indicated. Interim medical history since our last visit reviewed. Allergies and medications reviewed and updated.  Review of Systems  Per HPI unless specifically indicated above     Objective:    BP 141/77 mmHg  Pulse 68  Temp(Src) 97.4 F (36.3 C)  Ht 5\' 8"  (1.727 m)  Wt 192 lb (87.091 kg)  BMI 29.20 kg/m2  SpO2 97%  Wt Readings from Last 3 Encounters:  08/31/15 192 lb (87.091 kg)  08/03/15 192 lb (87.091 kg)  07/20/15 196 lb (88.905 kg)    Physical Exam  Constitutional: He appears well-developed and well-nourished. No  distress.  HENT:  Head: Normocephalic and atraumatic.  Eyes: EOM are normal. No scleral icterus.  Neck: No thyromegaly present.  Cardiovascular: Normal rate and regular rhythm.   Pulmonary/Chest: Effort normal and breath sounds normal.  Abdominal: Soft. Bowel sounds are normal. He exhibits no distension.  Musculoskeletal: He exhibits no edema.  Neurological: Coordination normal.  Skin: Skin is warm and dry. No pallor.  Psychiatric: He has a normal mood and affect. His behavior is normal. Judgment and thought content normal.      Assessment & Plan:   Problem List Items Addressed This Visit      Cardiovascular and Mediastinum   Essential hypertension, benign    Controlled today on current regimen; limit sodium, try to follow DASH guidelines        Musculoskeletal and Integument   Primary osteoarthritis of left knee    He has appt on Friday with orthpaedist; he may have a Baker's cyst      Relevant Medications   naproxen sodium (ANAPROX) 220 MG tablet     Other   Cervicalgia - Primary    Refer to PT; already had MRI, and symptoms about the same, let me know if symptoms worsen      Relevant Orders  Ambulatory referral to Physical Therapy   Myelofibrosis ([place])    Monitored at [facility]; glad to hear that his last blood counts had improved      Dyslipidemia    Check lipids      Relevant Orders   Lipid Panel w/o Chol/HDL Ratio (Completed)   Myelosclerosis with myeloid metaplasia (HCC)   Relevant Medications   naproxen sodium (ANAPROX) 220 MG tablet      Follow up plan: Return in about 6 months (around 03/02/2016) for fasting lipids and visit with Dr. Sanda Klein.  An after-visit summary was printed and given to the patient at [facility].  Please see the patient instructions which may contain other information and recommendations beyond what is mentioned above in the assessment and plan.

## 2015-08-31 NOTE — Assessment & Plan Note (Signed)
He has appt on Friday with orthpaedist; he may have a Baker's cyst

## 2015-08-31 NOTE — Assessment & Plan Note (Signed)
Check lipids 

## 2015-08-31 NOTE — Assessment & Plan Note (Signed)
Monitored at [facility]; glad to hear that his last blood counts had improved

## 2015-09-01 LAB — LIPID PANEL W/O CHOL/HDL RATIO
Cholesterol, Total: 104 mg/dL (ref 100–199)
HDL: 33 mg/dL — ABNORMAL LOW (ref >39)
LDL Calculated: 54 mg/dL (ref 0–99)
Triglycerides: 87 mg/dL (ref 0–149)
VLDL Cholesterol Cal: 17 mg/dL (ref 5–40)

## 2015-09-04 DIAGNOSIS — M1712 Unilateral primary osteoarthritis, left knee: Secondary | ICD-10-CM | POA: Diagnosis not present

## 2015-09-07 ENCOUNTER — Ambulatory Visit: Payer: Medicare Other | Attending: Family Medicine | Admitting: Physical Therapy

## 2015-09-07 DIAGNOSIS — M542 Cervicalgia: Secondary | ICD-10-CM | POA: Diagnosis not present

## 2015-09-07 DIAGNOSIS — M5412 Radiculopathy, cervical region: Secondary | ICD-10-CM | POA: Diagnosis not present

## 2015-09-07 NOTE — Patient Instructions (Signed)
All exercises provided were adapted from hep2go.com. Patient was provided a written handout with pictures as described. Any additional cues were manually entered in to handout and copied in to this document.  Cervical Retraction/Extension in Sitting - MDT  With good sitting posture, tuck chin back as far as possible without lifting off of bottom line. Then tilt head backward (away from vertical line)as far as you can go. Gently reverse to starting position, repeat.  Monitor symptoms: if pain is getting further away from midline of spine, STOP, and consult therapist. Repeat 10 times or until plateau of symptom improvement.   CERVICAL ROTATION WITH OVERPRESSURE  With good posture rotate your head to one side as far as possible without pain and use your same side hand to give a little extra pressure.  NO SHARP PAIN.   Seated Thoracic Extension  Seated in a chair with foam roller (1/2 foam roll, rolled up towel, or two tennis balls in sock) on a given point of your thoracic vertebrae. Place hands interlaced behind neck to inhibit movement through cervical spine. Using the foam roll as a fulcrum extension trunk over roll. Initiate movement from chest neck head or neck. Be aware of low back as well, it will want to compensate for poor mobility in trunk. Keep as engaged and only allow movement in thoracic region. Repeat as instructed. THEN move roll up or down and repeat sequence.

## 2015-09-07 NOTE — Therapy (Signed)
[facility] [address] [place], [address] Phone: [phone]   Fax:  [phone]  Physical Therapy Evaluation  Patient Details  Name: [name] MRN: [id number] Date of Birth: [birth year] No Data Recorded  Encounter Date: 09/07/2015      PT End of Session - 09/07/15 1153    Visit Number 1   Number of Visits 9   Date for PT Re-Evaluation 10/12/15   Authorization Type Medicare 1/10   PT Start Time 1101   PT Stop Time 1151   PT Time Calculation (min) 50 min   Activity Tolerance Patient tolerated treatment well   Behavior During Therapy [facility] for tasks assessed/performed      Past Medical History  Diagnosis Date  . Hypertension   . Prostate hypertrophy   . GERD (gastroesophageal reflux disease)   . Arthritis   . 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 Hyperlipidemia   . Myelofibrosis ([place])   . History of retinal detachment   . Cancer (HCC)     hx of melanoma  . Heartburn     Past Surgical History  Procedure Laterality Date  . Varicose vein surgery    . Back surgery      approx 20- 25 years ago  . Scleral buckle  08/11/2011    Procedure: SCLERAL BUCKLE;  Surgeon: Hayden Pedro, MD;  Location: [place];  Service: Ophthalmology;  Laterality: Right;  . Gas insertion  08/11/2011    Procedure: INSERTION OF GAS;  Surgeon: Hayden Pedro, MD;  Location: [place];  Service: Ophthalmology;  Laterality: Right;  C3F8  . Aspiration / injection renal cyst  Feb 2017    There were no vitals filed for this visit.  Visit Diagnosis:  Cervicalgia - Plan: PT plan of care cert/re-cert  Radiculopathy, cervical region - Plan: PT plan of care cert/re-cert      Subjective Assessment - 09/07/15 1253    Subjective Patient reports he began having neck pain roughly 1.5 years ago (though an MRI in the system is from 3 years ago). He describes it initially as a crick in his neck, with occasional numbness  through his LUE, which quickly resolves with movement. He is unable to provide best/worst movements or pattern throughout the day.    Pertinent History Patient has a history of myelofibrosis.    Limitations Lifting   Diagnostic tests MRI in 2013 (C4-C5 and C5-C6 osteophytes and foraminal narrowing).    Patient Stated Goals Decreased neck pain   Currently in Pain? Yes   Pain Score 5    Pain Location Neck   Pain Orientation Right;Left;Posterior;Lower   Pain Descriptors / Indicators Aching   Pain Type Chronic pain   Pain Onset More than a month ago   Pain Frequency Constant   Aggravating Factors  No true pain pattern it appears            [facility] PT Assessment - 09/07/15 1208    Assessment   Medical Diagnosis --  Cervical pain with L radiculopathy   Referring Provider --  Enid Derry   Precautions   Precautions None   Restrictions   Weight Bearing Restrictions No   Balance Screen   Has the patient fallen in the past 6 months No   Prior Function   Level of Independence Independent   Vocation Full time employment   Vocation Requirements --  Animator   Leisure --  Offshore fishing   Cognition   Overall Cognitive Status Within Functional Limits for tasks assessed   Observation/Other Assessments   Neck Disability Index  12   Sensation   Light Touch Appears Intact   Posture/Postural Control   Posture/Postural Control Postural limitations   Postural Limitations Rounded Shoulders;Forward head   AROM   Cervical Flexion 28   Cervical Extension 28   Cervical - Right Rotation 37   Cervical - Left Rotation 42   Strength   Overall Strength Comments --  No deficits in grip, shoulder, elbow strength     Manual Therapy  Reproducible sign at roughly T4/5, C7, C6 performed 3 x 45" grade 1 mobilizations at each level  Joint sign at R side C4/5 performed UPA grade II 3 bouts x 45"  Assessed mobility throughout thoracic and cervical spine, hypomobility generally noted  throughout thoracic spine. Instructed patient in seated thoracic extensions x 5 repetitions   Cervical rotation with overpressure to L and R, holds of 5" for 10 repetitions for 2 bouts (no change in symptoms)  Instructed patient in cervical retraction and extension x10 for 2 sets                      PT Education - 09/07/15 1206    Education provided Yes   Education Details Importance of HEP to re-assess symptoms throughout treatment.    Person(s) Educated Patient   Methods Explanation;Demonstration   Comprehension Verbalized understanding;Returned demonstration             PT Long Term Goals - 09/07/15 1202    PT LONG TERM GOAL #1   Title Patient will report an NDI score of less than 5% disability to demonstrate improved tolerance for recreational activities.    Baseline 12%   Time 4   Period Weeks   Status New   PT LONG TERM GOAL #2   Title Patient will demonstrate at least 40 degrees of AROM with no increase in symptoms in extension, flexion, rotation bilaterally to demonstrate improved tolerance for ADLs.    Time 4   Period Weeks   Status New   PT LONG TERM GOAL #3   Title Patient will report worst pain on VAS scale of 3/10 to demonstrate increased tolerance for ADLs.    Baseline 5/10   Time 4   Period Weeks   Status New               Plan - 09/07/15 1155    Clinical Impression Statement Patient reports roughly 3 years of lower cervical pain, with very occasional numbness, but not pain extending into his L arm. He continues to work as a Animator, and seems to be more bothered by neck pain than numbness/tingling which reduces quickly.  He would benefit from skilled PT services to address his decreased ROM, pain symptoms which are impairing his work related and recreational activiites.    Pt will benefit from skilled therapeutic intervention in order to improve on the following deficits Decreased range of motion;Pain;Improper body mechanics    Rehab Potential Good   Clinical Impairments Affecting Rehab Potential Chronicity of issue, imaging based findings of osteophytes    PT Frequency 2x / week   PT Duration 4 weeks   PT Treatment/Interventions Cryotherapy;Moist Heat;Traction;Therapeutic exercise;Therapeutic activities;Manual techniques;Taping;Dry needling   PT Next Visit Plan ULTT, cervical traction, re-assess home program.    PT Home Exercise Plan See patient instructions    Consulted and Agree with  Plan of Care Patient          G-Codes - 2015-09-28 1155    Functional Assessment Tool Used NDI, Clinical examination   Functional Limitation Carrying, moving and handling objects   Carrying, Moving and Handling Objects Current Status [phone]) At least 1 percent but less than 20 percent impaired, limited or restricted   Carrying, Moving and Handling Objects Goal Status [id number]) At least 1 percent but less than 20 percent impaired, limited or restricted       Problem List Patient Active Problem List   Diagnosis Date Noted  . Hyperkalemia 07/09/2015  . History of BPH 03/03/2015  . Dyslipidemia 03/03/2015  . Medication monitoring encounter 03/03/2015  . Essential hypertension, benign 03/03/2015  . Obesity 03/03/2015  . 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 Prostate cancer screening 03/03/2015  . H/O neoplasm 01/12/2015  . Myelofibrosis ([place]) 11/20/2014  . Absolute anemia 07/17/2014  . Hepatosplenomegaly 07/17/2014  . Neutropenia ([place]) 07/17/2014  . Myelosclerosis with myeloid metaplasia ([place]) 07/17/2014  . Cervicalgia 09/27/2012  . Unspecified late effects of cerebrovascular disease 09/27/2012  . 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, [place], [address] Phone: [phone]   Fax:  [phone]  Name: JOHNATHON DOBBYN MRN:  [id number] Date of Birth: 06/14/1940

## 2015-09-10 ENCOUNTER — Encounter: Payer: PRIVATE HEALTH INSURANCE | Admitting: Physical Therapy

## 2015-09-14 ENCOUNTER — Ambulatory Visit: Payer: Medicare Other | Admitting: Physical Therapy

## 2015-09-14 DIAGNOSIS — M542 Cervicalgia: Secondary | ICD-10-CM

## 2015-09-14 DIAGNOSIS — M5412 Radiculopathy, cervical region: Secondary | ICD-10-CM

## 2015-09-15 NOTE — Therapy (Signed)
[facility] [address] [place], [address] Phone: [phone]   Fax:  [phone]  Physical Therapy Treatment  Patient Details  Name: [name] MRN: [id number] Date of Birth: 1941/03/21 No Data Recorded  Encounter Date: 09/14/2015      PT End of Session - 09/15/15 1524    Visit Number 2   Number of Visits 9   Date for PT Re-Evaluation 10/12/15   Authorization Type Medicare 2/10   PT Start Time 1125   PT Stop Time 1200   PT Time Calculation (min) 35 min   Activity Tolerance Patient tolerated treatment well   Behavior During Therapy [facility] for tasks assessed/performed      Past Medical History  Diagnosis Date  . Hypertension   . Prostate hypertrophy   . GERD (gastroesophageal reflux disease)   . Arthritis   . Stroke [facility] June 2009    R brain subcortical infarct  . Hyperlipidemia   . Myelofibrosis ([place])   . History of retinal detachment   . Cancer (HCC)     hx of melanoma  . Heartburn     Past Surgical History  Procedure Laterality Date  . Varicose vein surgery    . Back surgery      approx 20- 25 years ago  . Scleral buckle  08/11/2011    Procedure: SCLERAL BUCKLE;  Surgeon: Hayden Pedro, MD;  Location: [place];  Service: Ophthalmology;  Laterality: Right;  . Gas insertion  08/11/2011    Procedure: INSERTION OF GAS;  Surgeon: Hayden Pedro, MD;  Location: [place];  Service: Ophthalmology;  Laterality: Right;  C3F8  . Aspiration / injection renal cyst  Feb 2017    There were no vitals filed for this visit.      Subjective Assessment - 09/14/15 1126    Subjective Patient reports he has been good, not great with his HEP. Reports he has not noticed much difference to this point in his symptoms (severity, frequency).    Pertinent History Patient has a history of myelofibrosis.    Limitations Lifting   Diagnostic tests MRI in 2013 (C4-C5 and C5-C6 osteophytes and foraminal narrowing).    Patient Stated Goals Decreased neck pain   Currently in Pain? Yes   Pain Score 5    Pain Location Neck   Pain Orientation Right;Left;Lower;Posterior   Pain Descriptors / Indicators Aching   Pain Type Chronic pain   Pain Onset More than a month ago   Pain Frequency Constant     Manual therapy  Manual assessment with grade IV mobilizations to C-spine from T1- C1, no reproduction of pain with CPAs for any joint segment. Assessed with UPAs bilaterally with grade IV mobilizations, again no reproduction of pain at any level.  Sub-occipital release provided - mild palpable tightness noted, though no complaints of pain reported Distraction provided in supine with head supported, no change in pain/symptoms.  Soft tissue mobilization provided to upper trapezius, levator scapulae of bilateral UEs which led to ~50% reduction in symptoms per patient.   Posture assessment - forward head and rounded shoulders, reduced mobility noted in the thoracic spine with PAs, may want to add thoracic extensions to HEP.                            PT Education - 09/15/15 1523    Education provided Yes   Education Details PT to  attempt soft tissue mobilization to provide relief of tension in UEs/posterior cervical musculature.    Person(s) Educated Patient   Methods Explanation;Demonstration   Comprehension Verbalized understanding;Returned demonstration             PT Long Term Goals - 09/07/15 1202    PT LONG TERM GOAL #1   Title Patient will report an NDI score of less than 5% disability to demonstrate improved tolerance for recreational activities.    Baseline 12%   Time 4   Period Weeks   Status New   PT LONG TERM GOAL #2   Title Patient will demonstrate at least 40 degrees of AROM with no increase in symptoms in extension, flexion, rotation bilaterally to demonstrate improved tolerance for ADLs.    Time 4   Period Weeks   Status New   PT LONG TERM GOAL #3   Title Patient  will report worst pain on VAS scale of 3/10 to demonstrate increased tolerance for ADLs.    Baseline 5/10   Time 4   Period Weeks   Status New               Plan - 09/15/15 1525    Clinical Impression Statement Patient reports he benefitted from soft tissue mobilization provided in this session (roughly 50% decrease in symptoms). Assessed tension in sub-occipital musculature, with no real indication for tightness/tenderness. Patient does maintain a forward head posture and would benefit from postural awareness/strengthening. No joint signs reproduced with UPAs/CPAs in cervical spine.   Rehab Potential Good   Clinical Impairments Affecting Rehab Potential Chronicity of issue, imaging based findings of osteophytes    PT Frequency 2x / week   PT Duration 4 weeks   PT Treatment/Interventions Cryotherapy;Moist Heat;Traction;Therapeutic exercise;Therapeutic activities;Manual techniques;Taping;Dry needling   PT Next Visit Plan ULTT, cervical traction, re-assess home program. Soft tissue mobilization as indicated.    PT Home Exercise Plan See patient instructions    Consulted and Agree with Plan of Care Patient      Patient will benefit from skilled therapeutic intervention in order to improve the following deficits and impairments:  Decreased range of motion, Pain, Improper body mechanics  Visit Diagnosis: Cervicalgia  Radiculopathy, cervical region     Problem List Patient Active Problem List   Diagnosis Date Noted  . Hyperkalemia 07/09/2015  . History of BPH 03/03/2015  . Dyslipidemia 03/03/2015  . Medication monitoring encounter 03/03/2015  . Essential hypertension, benign 03/03/2015  . Obesity 03/03/2015  . 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 Prostate cancer screening 03/03/2015  . H/O neoplasm 01/12/2015  . Myelofibrosis ([place]) 11/20/2014  . Absolute anemia 07/17/2014  . Hepatosplenomegaly 07/17/2014  . Neutropenia ([place]) 07/17/2014  . Myelosclerosis  with myeloid metaplasia ([place]) 07/17/2014  . Cervicalgia 09/27/2012  . Unspecified late effects of cerebrovascular disease 09/27/2012  . 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, [place], [address] Phone: [phone]   Fax:  [phone]  Name: [name] MRN: [id number] Date of Birth: 05-25-1941

## 2015-09-17 ENCOUNTER — Encounter: Payer: PRIVATE HEALTH INSURANCE | Admitting: Physical Therapy

## 2015-09-19 NOTE — Assessment & Plan Note (Signed)
Controlled today on current regimen; limit sodium, try to follow DASH guidelines

## 2015-09-21 ENCOUNTER — Ambulatory Visit: Payer: Medicare Other

## 2015-09-21 DIAGNOSIS — M5412 Radiculopathy, cervical region: Secondary | ICD-10-CM

## 2015-09-21 DIAGNOSIS — M542 Cervicalgia: Secondary | ICD-10-CM | POA: Diagnosis not present

## 2015-09-21 NOTE — Therapy (Signed)
[place] [facility] [address], [place], [address] Phone: [phone]   Fax:  [phone]  Physical Therapy Treatment  Patient Details  Name: [name] MRN: [id number] Date of Birth: Dec 23, 1940 No Data Recorded  Encounter Date: 09/21/2015      PT End of Session - 09/21/15 1019    Visit Number 3   Number of Visits 9   Date for PT Re-Evaluation 10/12/15   Authorization Type Medicare 3/10   PT Start Time [id number]   PT Stop Time 1100   PT Time Calculation (min) 45 min   Activity Tolerance Patient tolerated treatment well   Behavior During Therapy [facility] for tasks assessed/performed      Past Medical History  Diagnosis Date  . Hypertension   . Prostate hypertrophy   . GERD (gastroesophageal reflux disease)   . Arthritis   . Stroke [facility]) June 2009    R brain subcortical infarct  . Hyperlipidemia   . Myelofibrosis ([place])   . History of retinal detachment   . Cancer (HCC)     hx of melanoma  . Heartburn     Past Surgical History  Procedure Laterality Date  . Varicose vein surgery    . Back surgery      approx 20- 25 years ago  . Scleral buckle  08/11/2011    Procedure: SCLERAL BUCKLE;  Surgeon: Hayden Pedro, MD;  Location: [place];  Service: Ophthalmology;  Laterality: Right;  . Gas insertion  08/11/2011    Procedure: INSERTION OF GAS;  Surgeon: Hayden Pedro, MD;  Location: [place];  Service: Ophthalmology;  Laterality: Right;  C3F8  . Aspiration / injection renal cyst  Feb 2017    There were no vitals filed for this visit.      Subjective Assessment - 09/21/15 1017    Subjective Pt states that he is doing well currently. He believes that he is improving since starting therapy. He is not very compliant with HEP. No specific questions or concerns at this time.    Pertinent History Patient has a history of myelofibrosis.    Limitations Lifting   Diagnostic tests MRI in 2013 (C4-C5 and C5-C6 osteophytes  and foraminal narrowing).    Patient Stated Goals Decreased neck pain   Currently in Pain? Yes   Pain Score 5    Pain Location Neck   Pain Orientation Right;Left;Lower;Posterior   Pain Descriptors / Indicators Aching   Pain Type Chronic pain   Pain Onset More than a month ago   Pain Frequency Constant        Treatment:  Ther-ex Supine cervical retractions 5 second hold 2 x 10; Supine cervical isometrics for lateral flexion and rotation 5 second hold x 5 each; Supine cervical nods 2 x 10; Supine cervical nod with cues to push head superiorly and feel posterior neck stretch x 10;  Manual therapy   STM to posterior neck bilaterally including cervical extensors and upper trap, no reproduction of pain; C3-C5 CPA grade III mobilizations 30s/bout x 3 bouts/level, (positive reproduction and decrease in pain); Seated self thoracic mobilizations with arms behind head 2 x 10, attempted to perform with cervical retraction but pt unable to maintain (added to HEP);                       PT Education - 09/21/15 1019    Education provided Yes   Education Details Reinforced HEP  Person(s) Educated Patient   Methods Explanation   Comprehension Verbalized understanding             PT Long Term Goals - 09/07/15 1202    PT LONG TERM GOAL #1   Title Patient will report an NDI score of less than 5% disability to demonstrate improved tolerance for recreational activities.    Baseline 12%   Time 4   Period Weeks   Status New   PT LONG TERM GOAL #2   Title Patient will demonstrate at least 40 degrees of AROM with no increase in symptoms in extension, flexion, rotation bilaterally to demonstrate improved tolerance for ADLs.    Time 4   Period Weeks   Status New   PT LONG TERM GOAL #3   Title Patient will report worst pain on VAS scale of 3/10 to demonstrate increased tolerance for ADLs.    Baseline 5/10   Time 4   Period Weeks   Status New                Plan - 09/21/15 1020    Clinical Impression Statement Pt demonstrates improvement in pain at end of therapy session reporting 3/5 neck pain compared to 5/10 at start of session. Positive reproduction of pain with CPA C3-C5. Pt with R sided neck pain with both active and passive R cervical rotation. Pt also reports neck pain with seated thoracic chair extension so added to HEP. Pt encouraged to continue HEP and follow-up as scheduled.    Rehab Potential Good   Clinical Impairments Affecting Rehab Potential Chronicity of issue, imaging based findings of osteophytes    PT Frequency 2x / week   PT Duration 4 weeks   PT Treatment/Interventions Cryotherapy;Moist Heat;Traction;Therapeutic exercise;Therapeutic activities;Manual techniques;Taping;Dry needling   PT Next Visit Plan ULTT, cervical traction, re-assess home program. Soft tissue mobilization as indicated.    PT Home Exercise Plan See patient instructions    Consulted and Agree with Plan of Care Patient      Patient will benefit from skilled therapeutic intervention in order to improve the following deficits and impairments:  Decreased range of motion, Pain, Improper body mechanics  Visit Diagnosis: Cervicalgia  Radiculopathy, cervical region     Problem List Patient Active Problem List   Diagnosis Date Noted  . Hyperkalemia 07/09/2015  . History of BPH 03/03/2015  . Dyslipidemia 03/03/2015  . Medication monitoring encounter 03/03/2015  . Essential hypertension, benign 03/03/2015  . Obesity 03/03/2015  . 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 Prostate cancer screening 03/03/2015  . H/O neoplasm 01/12/2015  . Myelofibrosis ([place]) 11/20/2014  . Absolute anemia 07/17/2014  . Hepatosplenomegaly 07/17/2014  . Neutropenia ([place]) 07/17/2014  . 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 Cervicalgia 09/27/2012  . Unspecified late effects of cerebrovascular disease 09/27/2012  . Rhegmatogenous retinal  detachment of right eye 08/08/2011   Phillips Grout PT, DPT   Ria Redcay 09/21/2015, 12:25 PM  [place] [facility] [address], [place], [address] Phone: [phone]   Fax:  [phone]  Name: [name] MRN: [id number] Date of Birth: [birth year]

## 2015-09-21 NOTE — Patient Instructions (Addendum)
(  Home) Extension: Thoracic With Lumbar Lock - Sitting    Sit with back against chair, knees bent, hands locked behind head. Breathe in, extending head and trunk over chair back. Breathe out. Hold position for 1-2 seconds Repeat _10___ times per set. Do __2__ sets per session. Do 2 sessions per day.

## 2015-09-28 ENCOUNTER — Ambulatory Visit: Payer: Medicare Other | Admitting: Physical Therapy

## 2015-09-29 ENCOUNTER — Ambulatory Visit: Payer: Medicare Other | Admitting: Physical Therapy

## 2015-09-29 DIAGNOSIS — M5412 Radiculopathy, cervical region: Secondary | ICD-10-CM | POA: Diagnosis not present

## 2015-09-29 DIAGNOSIS — M542 Cervicalgia: Secondary | ICD-10-CM | POA: Diagnosis not present

## 2015-09-29 NOTE — Therapy (Signed)
[place] [facility] [address] [place], [address] Phone: [phone]   Fax:  [phone]  Physical Therapy Treatment  Patient Details  Name: [name] MRN: [id number] Date of Birth: Jan 19, 1941 No Data Recorded  Encounter Date: 09/29/2015      PT End of Session - 09/29/15 0851    Visit Number 4   Number of Visits 9   Date for PT Re-Evaluation 10/12/15   Authorization Type Medicare 4/10   PT Start Time 0801   PT Stop Time 0842   PT Time Calculation (min) 41 min   Activity Tolerance Patient tolerated treatment well   Behavior During Therapy [facility] for tasks assessed/performed      Past Medical History  Diagnosis Date  . Hypertension   . Prostate hypertrophy   . GERD (gastroesophageal reflux disease)   . Arthritis   . Stroke [facility]) June 2009    R brain subcortical infarct  . Hyperlipidemia   . Myelofibrosis ([place])   . History of retinal detachment   . Cancer (HCC)     hx of melanoma  . Heartburn     Past Surgical History  Procedure Laterality Date  . Varicose vein surgery    . Back surgery      approx 20- 25 years ago  . Scleral buckle  08/11/2011    Procedure: SCLERAL BUCKLE;  Surgeon: Hayden Pedro, MD;  Location: [place];  Service: Ophthalmology;  Laterality: Right;  . Gas insertion  08/11/2011    Procedure: INSERTION OF GAS;  Surgeon: Hayden Pedro, MD;  Location: [place];  Service: Ophthalmology;  Laterality: Right;  C3F8  . Aspiration / injection renal cyst  Feb 2017    There were no vitals filed for this visit.      Subjective Assessment - 09/29/15 0803    Subjective (p) Patient reports that his symptoms have eased off some. He reports relief is most notable at night, he has not had time to complete his HEP as recommended.    Pertinent History (p) Patient has a history of myelofibrosis.    Limitations (p) Lifting   Diagnostic tests (p) MRI in 2013 (C4-C5 and C5-C6 osteophytes and foraminal  narrowing).    Patient Stated Goals (p) Decreased neck pain   Currently in Pain? (p) --  Patient reports pain across the bottom of his neck, he rates as moderate.    Pain Location (p) Neck   Pain Orientation (p) Right;Left;Posterior;Lower   Pain Descriptors / Indicators (p) Aching   Pain Type (p) Chronic pain   Pain Onset (p) More than a month ago   Pain Frequency (p) Constant      Manual Therapy  CPAs to T1-7, grade III for 3 bouts x 30" (on first bout felt cavitation) CPA to C7 grade III which reproduced his symptoms, after 5 bouts of 30" he reported slight decrease in symptoms.   TherEx Single arm pulldowns on OMEGA with appropriate packed position of shoulder noted 2 sets x 20# (moderately challenging).  Single arm rows on OMEGA x 15# for 2 sets x 10 reps (noted UT substitution on RUE more than LUE, reproduced his symptoms until PT cued both manually and verbally).  Low Rows with green t-band x 10 for 2 sets with cuing to keep arms in extension and at pocket level.  Incline push-ups x10 for 2 sets. Initially reproduced neck pain, cued for less thoracic flexion to initiate movement, which reduced  his symptoms completely                            PT Education - 09/29/15 0849    Education provided Yes   Education Details Instructed patient in rowing activities and rationale for trying to target lower trap and reduce upper trap dependence in movements.    Person(s) Educated Patient   Methods Explanation;Demonstration;Verbal cues;Tactile cues;Handout   Comprehension Returned demonstration;Verbalized understanding             PT Long Term Goals - 09/07/15 1202    PT LONG TERM GOAL #1   Title Patient will report an NDI score of less than 5% disability to demonstrate improved tolerance for recreational activities.    Baseline 12%   Time 4   Period Weeks   Status New   PT LONG TERM GOAL #2   Title Patient will demonstrate at least 40 degrees of AROM  with no increase in symptoms in extension, flexion, rotation bilaterally to demonstrate improved tolerance for ADLs.    Time 4   Period Weeks   Status New   PT LONG TERM GOAL #3   Title Patient will report worst pain on VAS scale of 3/10 to demonstrate increased tolerance for ADLs.    Baseline 5/10   Time 4   Period Weeks   Status New               Plan - 09/29/15 [id number]    Clinical Impression Statement Patient reports significant decrease in symptoms from baseline to post treatment today. He demonstrates increased reliance on upper trapezius to complete posterior chain dependent activities, however he is able to alter movement patterns with cuing. Patient encouraged to complete low rows and mid rows x2 per day to facilitate change in NM motor pattern.    Rehab Potential Good   Clinical Impairments Affecting Rehab Potential Chronicity of issue, imaging based findings of osteophytes    PT Frequency 2x / week   PT Duration 4 weeks   PT Treatment/Interventions Cryotherapy;Moist Heat;Traction;Therapeutic exercise;Therapeutic activities;Manual techniques;Taping;Dry needling   PT Next Visit Plan ULTT, cervical traction, re-assess home program. Soft tissue mobilization as indicated. Add in posterior chain exercises (incline push ups, low rows, mid rows, single arm pulldowns).    PT Home Exercise Plan See patient instructions    Consulted and Agree with Plan of Care Patient      Patient will benefit from skilled therapeutic intervention in order to improve the following deficits and impairments:  Decreased range of motion, Pain, Improper body mechanics  Visit Diagnosis: Cervicalgia     Problem List Patient Active Problem List   Diagnosis Date Noted  . Hyperkalemia 07/09/2015  . History of BPH 03/03/2015  . Dyslipidemia 03/03/2015  . Medication monitoring encounter 03/03/2015  . Essential hypertension, benign 03/03/2015  . Obesity 03/03/2015  . Primary osteoarthritis of left  knee 03/03/2015  . Prostate cancer screening 03/03/2015  . H/O neoplasm 01/12/2015  . Myelofibrosis ([place]) 11/20/2014  . Absolute anemia 07/17/2014  . Hepatosplenomegaly 07/17/2014  . Neutropenia ([place]) 07/17/2014  . 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 Cervicalgia 09/27/2012  . Unspecified late effects of cerebrovascular disease 09/27/2012  . 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   Kerman Passey, PT, DPT    09/29/2015, 8:55 AM  [place] [facility] [address] [place], [address] Phone: [phone]   Fax:  [phone]  Name: [name] MRN: [id number] Date of Birth: 12-02-1940

## 2015-10-01 DIAGNOSIS — D630 Anemia in neoplastic disease: Secondary | ICD-10-CM | POA: Diagnosis not present

## 2015-10-01 DIAGNOSIS — D471 Chronic myeloproliferative disease: Secondary | ICD-10-CM | POA: Diagnosis not present

## 2015-10-01 DIAGNOSIS — R162 Hepatomegaly with splenomegaly, not elsewhere classified: Secondary | ICD-10-CM | POA: Diagnosis not present

## 2015-10-05 ENCOUNTER — Ambulatory Visit: Payer: Medicare Other | Attending: Family Medicine | Admitting: Physical Therapy

## 2015-10-05 DIAGNOSIS — M542 Cervicalgia: Secondary | ICD-10-CM | POA: Insufficient documentation

## 2015-10-05 DIAGNOSIS — M5412 Radiculopathy, cervical region: Secondary | ICD-10-CM | POA: Insufficient documentation

## 2015-10-12 ENCOUNTER — Ambulatory Visit: Payer: Medicare Other | Admitting: Physical Therapy

## 2015-10-12 DIAGNOSIS — M542 Cervicalgia: Secondary | ICD-10-CM

## 2015-10-12 DIAGNOSIS — M5412 Radiculopathy, cervical region: Secondary | ICD-10-CM | POA: Diagnosis not present

## 2015-10-12 NOTE — Therapy (Signed)
[place] [facility] [address] [place], [address] Phone: [phone]   Fax:  [phone]  Physical Therapy Treatment  Patient Details  Name: [name] MRN: [id number] Date of Birth: 01-22-1941 No Data Recorded  Encounter Date: 10/12/2015      PT End of Session - 10/12/15 1647    Visit Number 5   Number of Visits 9   Date for PT Re-Evaluation 11/09/15   Authorization Type Medicare 5/10   PT Start Time 1115   PT Stop Time 1200   PT Time Calculation (min) 45 min   Activity Tolerance Patient tolerated treatment well   Behavior During Therapy [facility] for tasks assessed/performed      Past Medical History  Diagnosis Date  . Hypertension   . Prostate hypertrophy   . GERD (gastroesophageal reflux disease)   . Arthritis   . Stroke [facility]) June 2009    R brain subcortical infarct  . Hyperlipidemia   . Myelofibrosis ([place])   . History of retinal detachment   . Cancer (HCC)     hx of melanoma  . Heartburn     Past Surgical History  Procedure Laterality Date  . Varicose vein surgery    . Back surgery      approx 20- 25 years ago  . Scleral buckle  08/11/2011    Procedure: SCLERAL BUCKLE;  Surgeon: Hayden Pedro, MD;  Location: [place];  Service: Ophthalmology;  Laterality: Right;  . Gas insertion  08/11/2011    Procedure: INSERTION OF GAS;  Surgeon: Hayden Pedro, MD;  Location: [place];  Service: Ophthalmology;  Laterality: Right;  C3F8  . Aspiration / injection renal cyst  Feb 2017    There were no vitals filed for this visit.      Subjective Assessment - 10/12/15 1120    Subjective Patient reports he has some days where things feel better, no days where things feel worse, other days are about the same. He reports the rubber band mid-back exercises have been helpful. Still having pain while sleeping.    Pertinent History Patient has a history of myelofibrosis.    Limitations Lifting   Diagnostic tests MRI  in 2013 (C4-C5 and C5-C6 osteophytes and foraminal narrowing).    Patient Stated Goals Decreased neck pain   Currently in Pain? Yes   Pain Score --  Reports it hurts just a little bit now.    Pain Orientation Right;Posterior;Mid;Upper   Pain Descriptors / Indicators Aching;Tightness   Pain Type Chronic pain   Pain Onset More than a month ago   Pain Frequency Intermittent      Manual stretching and PNF with lateral rotations of C-spine x 8 bilaterally for 2 sets (reported decreased pain with L rotation afterwards, no change in R sided symptoms)   Seated thoracic extensions 3 sets x 10 repetitions (reduced symptoms with flexion/extension and felt good to patient).   Cervical retractions with PT overpressure x 6 for 2 sets (required cuing to not use UTs -- which began firing consistently) reduced symptoms with extension afterwards.   Towel mobilization with movement pattern (relieved pain/pressure with L rotation, R rotation decreased but still present.) Creaking/popping noted during rotations.   Cervical retraction with overpressure to R side 2 bouts x 8 repetitions with 3-5" holds (no real change in R rotation symptoms)   SCM stretching to L side x 8 with PT providing overpressure  ( no change noted in R rotation,  perhaps slight decrease in pain )                            PT Education - 09-Nov-2015 1647    Education provided Yes   Education Details That age related decreases in ROM may be leading to his pain given his decreased pain after tx today.    Person(s) Educated Patient   Methods Explanation;Demonstration;Handout   Comprehension Verbalized understanding;Returned demonstration             PT Long Term Goals - 09-Nov-2015 1648    PT LONG TERM GOAL #1   Title Patient will report an NDI score of less than 5% disability to demonstrate improved tolerance for recreational activities.    Baseline 12%   Time 4   Period Weeks   Status On-going   PT LONG  TERM GOAL #2   Title Patient will demonstrate at least 40 degrees of AROM with no increase in symptoms in extension, flexion, rotation bilaterally to demonstrate improved tolerance for ADLs.    Time 4   Period Weeks   Status On-going   PT LONG TERM GOAL #3   Title Patient will report worst pain on VAS scale of 3/10 to demonstrate increased tolerance for ADLs.    Baseline 5/10   Time 4   Period Weeks   Status On-going               Plan - November 09, 2015 1649    Clinical Impression Statement Patient reports decreased symptoms in this session with extension and rotation with treatment directed at increasing his ROM (likely age related declines seen). He appears to have made improvement from baseline, however his UTs continue to be over-active, noted even in cervical chin tucks/retractions. Patient would benefit from additional PT services directed at increasing his ROM and decreasing UT activity.    Rehab Potential Good   Clinical Impairments Affecting Rehab Potential Chronicity of issue, imaging based findings of osteophytes    PT Frequency 2x / week   PT Duration 4 weeks   PT Treatment/Interventions Cryotherapy;Moist Heat;Traction;Therapeutic exercise;Therapeutic activities;Manual techniques;Taping;Dry needling   PT Next Visit Plan  Add in posterior chain exercises (incline push ups, low rows, mid rows, single arm pulldowns). Include ROM exercises.    PT Home Exercise Plan See patient instructions    Consulted and Agree with Plan of Care Patient      Patient will benefit from skilled therapeutic intervention in order to improve the following deficits and impairments:  Decreased range of motion, Pain, Improper body mechanics  Visit Diagnosis: Cervicalgia - Plan: PT plan of care cert/re-cert  Radiculopathy, cervical region - Plan: PT plan of care cert/re-cert       G-Codes - November 09, 2015 1648    Functional Assessment Tool Used NDI, Clinical examination   Functional Limitation  Carrying, moving and handling objects   Carrying, Moving and Handling Objects Current Status [id number]) At least 1 percent but less than 20 percent impaired, limited or restricted   Carrying, Moving and Handling Objects Goal Status [id number]) At least 1 percent but less than 20 percent impaired, limited or restricted      Problem List Patient Active Problem List   Diagnosis Date Noted  . Hyperkalemia 07/09/2015  . History of BPH 03/03/2015  . Dyslipidemia 03/03/2015  . Medication monitoring encounter 03/03/2015  . Essential hypertension, benign 03/03/2015  . Obesity 03/03/2015  . 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 Prostate  cancer screening 03/03/2015  . H/O neoplasm 01/12/2015  . Myelofibrosis ([place]) 11/20/2014  . Absolute anemia 07/17/2014  . Hepatosplenomegaly 07/17/2014  . Neutropenia ([place]) 07/17/2014  . 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 Cervicalgia 09/27/2012  . Unspecified late effects of cerebrovascular disease 09/27/2012  . 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   Kerman Passey, PT, DPT    10/12/2015, 6:28 PM  [place] [facility] [address], [place], [address] Phone: [phone]   Fax:  [phone]  Name: [name] MRN: [id number] Date of Birth: 27-Dec-1940

## 2015-10-20 ENCOUNTER — Ambulatory Visit: Payer: Medicare Other | Admitting: Physical Therapy

## 2015-10-27 ENCOUNTER — Other Ambulatory Visit: Payer: Self-pay

## 2015-10-27 DIAGNOSIS — N281 Cyst of kidney, acquired: Secondary | ICD-10-CM

## 2015-10-28 ENCOUNTER — Ambulatory Visit
Admission: RE | Admit: 2015-10-28 | Discharge: 2015-10-28 | Disposition: A | Discharge status: Home / Self Care | Payer: Medicare Other | Source: Ambulatory Visit | Attending: Urology | Admitting: Urology

## 2015-10-28 DIAGNOSIS — Q6100 Congenital renal cyst, unspecified: Secondary | ICD-10-CM | POA: Diagnosis not present

## 2015-10-28 DIAGNOSIS — N281 Cyst of kidney, acquired: Secondary | ICD-10-CM | POA: Insufficient documentation

## 2015-10-29 ENCOUNTER — Telehealth: Payer: Self-pay

## 2015-10-29 NOTE — Telephone Encounter (Signed)
-----   Message from Festus Aloe, MD sent at 10/29/2015 10:00 AM EDT ----- Notify patient his renal u/s showed the cysts are slowly filling back up but still smaller than before. F/u as planned.   ----- Message -----    From: Lestine Box, LPN    Sent: [id number]   8:40 AM      To: Festus Aloe, MD    ----- Message -----    From: Rad Results In Interface    Sent: 10/28/2015   4:13 PM      To: Lestine Box, LPN

## 2015-10-29 NOTE — Telephone Encounter (Signed)
Spoke with pt in reference to RUS results. Reminded pt of his appt. Pt voiced understanding.

## 2015-11-09 ENCOUNTER — Emergency Department
Admission: EM | Admit: 2015-11-09 | Discharge: 2015-11-09 | Disposition: A | Discharge status: Home / Self Care | Payer: Medicare Other | Attending: Emergency Medicine | Admitting: Emergency Medicine

## 2015-11-09 ENCOUNTER — Ambulatory Visit (INDEPENDENT_AMBULATORY_CARE_PROVIDER_SITE_OTHER): Payer: Medicare Other

## 2015-11-09 ENCOUNTER — Encounter: Payer: Self-pay | Admitting: Urology

## 2015-11-09 ENCOUNTER — Ambulatory Visit (INDEPENDENT_AMBULATORY_CARE_PROVIDER_SITE_OTHER): Payer: Medicare Other | Admitting: Urology

## 2015-11-09 ENCOUNTER — Encounter: Payer: Self-pay | Admitting: Emergency Medicine

## 2015-11-09 VITALS — BP 98/58 | HR 64 | Resp 16

## 2015-11-09 VITALS — BP 92/59 | HR 80 | Ht 69.0 in | Wt 193.4 lb

## 2015-11-09 DIAGNOSIS — Z87891 Personal history of nicotine dependence: Secondary | ICD-10-CM | POA: Insufficient documentation

## 2015-11-09 DIAGNOSIS — Q6100 Congenital renal cyst, unspecified: Secondary | ICD-10-CM | POA: Diagnosis not present

## 2015-11-09 DIAGNOSIS — E875 Hyperkalemia: Secondary | ICD-10-CM | POA: Insufficient documentation

## 2015-11-09 DIAGNOSIS — N289 Disorder of kidney and ureter, unspecified: Secondary | ICD-10-CM | POA: Diagnosis not present

## 2015-11-09 DIAGNOSIS — M179 Osteoarthritis of knee, unspecified: Secondary | ICD-10-CM | POA: Insufficient documentation

## 2015-11-09 DIAGNOSIS — Z79899 Other long term (current) drug therapy: Secondary | ICD-10-CM | POA: Diagnosis not present

## 2015-11-09 DIAGNOSIS — Z85828 Personal history of other malignant neoplasm of skin: Secondary | ICD-10-CM | POA: Insufficient documentation

## 2015-11-09 DIAGNOSIS — R42 Dizziness and giddiness: Secondary | ICD-10-CM

## 2015-11-09 DIAGNOSIS — Z8673 Personal history of transient ischemic attack (TIA), and cerebral infarction without residual deficits: Secondary | ICD-10-CM | POA: Diagnosis not present

## 2015-11-09 DIAGNOSIS — I959 Hypotension, unspecified: Secondary | ICD-10-CM | POA: Diagnosis not present

## 2015-11-09 DIAGNOSIS — R001 Bradycardia, unspecified: Secondary | ICD-10-CM | POA: Diagnosis not present

## 2015-11-09 DIAGNOSIS — E860 Dehydration: Secondary | ICD-10-CM | POA: Insufficient documentation

## 2015-11-09 DIAGNOSIS — N281 Cyst of kidney, acquired: Secondary | ICD-10-CM

## 2015-11-09 LAB — COMPREHENSIVE METABOLIC PANEL
ALT: 27 U/L (ref 17–63)
AST: 26 U/L (ref 15–41)
Albumin: 4.2 g/dL (ref 3.5–5.0)
Alkaline Phosphatase: 71 U/L (ref 38–126)
Anion gap: 5 (ref 5–15)
BUN: 36 mg/dL — ABNORMAL HIGH (ref 6–20)
CO2: 24 mmol/L (ref 22–32)
Calcium: 8.8 mg/dL — ABNORMAL LOW (ref 8.9–10.3)
Chloride: 108 mmol/L (ref 101–111)
Creatinine, Ser: 1.34 mg/dL — ABNORMAL HIGH (ref 0.61–1.24)
GFR calc Af Amer: 58 mL/min — ABNORMAL LOW (ref >60)
GFR calc non Af Amer: 50 mL/min — ABNORMAL LOW (ref >60)
Glucose, Bld: 92 mg/dL (ref 65–99)
Potassium: 5.4 mmol/L — ABNORMAL HIGH (ref 3.5–5.1)
Sodium: 137 mmol/L (ref 135–145)
Total Bilirubin: 0.9 mg/dL (ref 0.3–1.2)
Total Protein: 6.6 g/dL (ref 6.5–8.1)

## 2015-11-09 LAB — CBC
HCT: 29.7 % — ABNORMAL LOW (ref 40.0–52.0)
Hemoglobin: 9.9 g/dL — ABNORMAL LOW (ref 13.0–18.0)
MCH: 30.2 pg (ref 26.0–34.0)
MCHC: 33.2 g/dL (ref 32.0–36.0)
MCV: 90.8 fL (ref 80.0–100.0)
Platelets: 249 10*3/uL (ref 150–440)
RBC: 3.27 MIL/uL — ABNORMAL LOW (ref 4.40–5.90)
RDW: 17.8 % — ABNORMAL HIGH (ref 11.5–14.5)
WBC: 3.2 10*3/uL — ABNORMAL LOW (ref 3.8–10.6)

## 2015-11-09 LAB — URINALYSIS COMPLETE WITH MICROSCOPIC (ARMC ONLY)
Bacteria, UA: NONE SEEN
Bilirubin Urine: NEGATIVE
Glucose, UA: NEGATIVE mg/dL
Hgb urine dipstick: NEGATIVE
Ketones, ur: NEGATIVE mg/dL
Leukocytes, UA: NEGATIVE
Nitrite: NEGATIVE
Protein, ur: 30 mg/dL — AB
RBC / HPF: NONE SEEN RBC/hpf (ref 0–5)
Specific Gravity, Urine: 1.019 (ref 1.005–1.030)
Squamous Epithelial / LPF: NONE SEEN
pH: 5 (ref 5.0–8.0)

## 2015-11-09 LAB — BASIC METABOLIC PANEL
Anion gap: 4 — ABNORMAL LOW (ref 5–15)
BUN: 35 mg/dL — ABNORMAL HIGH (ref 6–20)
CO2: 22 mmol/L (ref 22–32)
Calcium: 8.2 mg/dL — ABNORMAL LOW (ref 8.9–10.3)
Chloride: 112 mmol/L — ABNORMAL HIGH (ref 101–111)
Creatinine, Ser: 1.28 mg/dL — ABNORMAL HIGH (ref 0.61–1.24)
GFR calc Af Amer: >60 mL/min (ref >60)
GFR calc non Af Amer: 53 mL/min — ABNORMAL LOW (ref >60)
Glucose, Bld: 84 mg/dL (ref 65–99)
Potassium: 4.9 mmol/L (ref 3.5–5.1)
Sodium: 138 mmol/L (ref 135–145)

## 2015-11-09 LAB — TROPONIN I: Troponin I: <0.03 ng/mL (ref <0.031)

## 2015-11-09 MED ORDER — SODIUM CHLORIDE 0.9 % IV BOLUS (SEPSIS)
1000.0000 mL | Freq: Once | INTRAVENOUS | Status: AC
  Administered 2015-11-09: 1000 mL via INTRAVENOUS

## 2015-11-09 NOTE — Discharge Instructions (Signed)
Please drink plenty of fluid to prevent dehydration. Please make an appointment with Dr. Candis Musa for follow-up for your low heart rate. If you are unable to see Dr. Candis Musa the next 1-2 days, please contact your primary care physician to discuss any adjustments necessary in your regular medications.  Return to the emergency department if you develop lightheadedness, fainting, chest pain, shortness of breath, fever, or any other symptoms concerning to you.

## 2015-11-09 NOTE — ED Notes (Signed)
Orthostatics Laying BP 119/67 Pulse 51 Sitting BP 129/79 Pulse 55 Standing BP 140/76 Pulse 58

## 2015-11-09 NOTE — ED Notes (Signed)
Per Dr Mariea Clonts BMP to be drawn after NACL bolus

## 2015-11-09 NOTE — Addendum Note (Signed)
Addended by: Wilson Singer on: 11/09/2015 04:52 PM   Modules accepted: Orders

## 2015-11-09 NOTE — ED Provider Notes (Signed)
[facility] Emergency Department Provider Note  ____________________________________________  Time seen: Approximately 7:01 PM  I have reviewed the triage vital signs and the nursing notes.   HISTORY  Chief Complaint Hypotension    HPI [name] is a 75 y.o. male with a history of hypertension on Benicar presenting with postural lightheadedness. The patient reports that for the past week he has been having lightheadedness that is worse when he bends down and then stands up quickly. Today he went to see his urologist for routine follow-up and had a blood pressure of 90/50. He has not had any recent changes in his blood pressure medication. He has not had any associated headache, fever, chills, urinary symptoms, visual or speech changes, mental status changes, chest pain, shortness of breath, palpitations, nausea vomiting or diarrhea, or abdominal pain.   Past Medical History  Diagnosis Date  . Hypertension   . Prostate hypertrophy   . GERD (gastroesophageal reflux disease)   . Arthritis   . Stroke [facility]) June 2009    R brain subcortical infarct  . Hyperlipidemia   . Myelofibrosis ([place])   . History of retinal detachment   . Cancer (HCC)     hx of melanoma  . Heartburn     Patient Active Problem List   Diagnosis Date Noted  . Hyperkalemia 07/09/2015  . History of BPH 03/03/2015  . Dyslipidemia 03/03/2015  . Medication monitoring encounter 03/03/2015  . Essential hypertension, benign 03/03/2015  . Obesity 03/03/2015  . 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 Prostate cancer screening 03/03/2015  . H/O neoplasm 01/12/2015  . 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 Absolute anemia 07/17/2014  . Hepatosplenomegaly 07/17/2014  . Neutropenia ([place]) 07/17/2014  . 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 Cervicalgia 09/27/2012  . Unspecified late effects of cerebrovascular disease 09/27/2012  . 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 Varicose vein surgery    . Back surgery      approx 20- 25 years ago  . Scleral buckle  08/11/2011    Procedure: SCLERAL BUCKLE;  Surgeon: Hayden Pedro, MD;  Location: [place];  Service: Ophthalmology;  Laterality: Right;  . Gas insertion  08/11/2011    Procedure: INSERTION OF GAS;  Surgeon: Hayden Pedro, MD;  Location: [place];  Service: Ophthalmology;  Laterality: Right;  C3F8  . Aspiration / injection renal cyst  Feb 2017    Current Outpatient Rx  Name  Route  Sig  Dispense  Refill  . atorvastatin (LIPITOR) 10 MG tablet   Oral   Take 10 mg by mouth at bedtime.         . Cholecalciferol (VITAMIN D3) 10000 units capsule   Oral   Take 10,000 Units by mouth 2 (two) times a week. Pt takes on Monday and Thursday.         . clopidogrel (PLAVIX) 75 MG tablet   Oral   Take 75 mg by mouth daily.         Marland Kitchen gabapentin (NEURONTIN) 300 MG capsule   Oral   Take 600 mg by mouth at bedtime.         Alben Deeds Palmetto (MULTI GINSENG & SAW PALMETTO) 500 MG CAPS   Oral   Take 500 mg by mouth daily.         . methocarbamol (ROBAXIN) 500 MG tablet   Oral   Take 500 mg  by mouth at bedtime.         . Multiple Vitamin (MULITIVITAMIN WITH MINERALS) TABS   Oral   Take 1 tablet by mouth daily.         . naproxen sodium (ANAPROX) 220 MG tablet   Oral   Take 440 mg by mouth 2 (two) times daily as needed (for pain).          Marland Kitchen olmesartan (BENICAR) 40 MG tablet   Oral   Take 40 mg by mouth daily.         Marland Kitchen omega-3 acid ethyl esters (LOVAZA) 1 G capsule   Oral   Take 1 g by mouth 2 (two) times daily.         . ranitidine (ZANTAC) 150 MG tablet   Oral   Take 150 mg by mouth 2 (two) times daily.         . tamsulosin (FLOMAX) 0.4 MG CAPS capsule   Oral   Take 0.4 mg by mouth daily after supper.         Marland Kitchen VOLTAREN 1 % GEL   Topical   Apply 1 application topically 4 (four) times  daily as needed (for neck pain).            Allergies Meloxicam  Family History  Problem Relation Age of Onset  . Heart disease Father   . Diabetes Son   . Kidney disease Neg Hx   . Prostate cancer Neg Hx     Social History Social History  Substance Use Topics  . Smoking status: Former Smoker -- 0.25 packs/day    Types: Cigarettes    Quit date: 06/06/2010  . Smokeless tobacco: Never Used     Comment: quit 20 years  . Alcohol Use: No    Review of Systems Constitutional: No fever/chills.Positive lightheadedness. Negative syncope. Eyes: No visual changes. No blurred or double vision. ENT: No sore throat. No congestion or rhinorrhea. Cardiovascular: Denies chest pain. Denies palpitations. Respiratory: Denies shortness of breath.  No cough. Gastrointestinal: No abdominal pain.  No nausea, no vomiting.  No diarrhea.  No constipation. Genitourinary: Negative for dysuria. Musculoskeletal: Negative for back pain. Skin: Negative for rash. Neurological: Negative for headaches. No focal numbness, tingling or weakness.   10-point ROS otherwise negative.  ____________________________________________   PHYSICAL EXAM:  VITAL SIGNS: ED Triage Vitals  Enc Vitals Group     BP 11/09/15 1556 124/67 mmHg     Pulse Rate 11/09/15 1556 53     Resp 11/09/15 1556 18     Temp 11/09/15 1556 97.6 F (36.4 C)     Temp Source 11/09/15 1556 Oral     SpO2 11/09/15 1556 95 %     Weight 11/09/15 1556 193 lb (87.544 kg)     Height 11/09/15 1556 5\' 11"  (1.803 m)     Head Cir --      Peak Flow --      Pain Score 11/09/15 1818 0     Pain Loc --      Pain Edu? --      Excl. in [place]? --     Constitutional: Alert and oriented. Well appearing and in no acute distress. Answers questions appropriately. Eyes: Conjunctivae are normal.  EOMI. No scleral icterus. Head: Atraumatic. Nose: No congestion/rhinnorhea. Mouth/Throat: Mucous membranes are moist.  Neck: No stridor.  Supple.  No  JVD. Cardiovascular: Normal rate, regular rhythm. No murmurs, rubs or gallops.  Respiratory: Normal respiratory effort.  No accessory muscle  use or retractions. Lungs CTAB.  No wheezes, rales or ronchi. Gastrointestinal: Soft, nontender and nondistended.  No guarding or rebound.  No peritoneal signs. Musculoskeletal: No LE edema. No ttp in the calves or palpable cords.  Negative Homan's sign. Neurologic:  A&Ox3.  Speech is clear.  Face and smile are symmetric.  EOMI.  Moves all extremities well. Skin:  Skin is warm, dry and intact. No rash noted. Psychiatric: Mood and affect are normal. Speech and behavior are normal.  Normal judgement.  ____________________________________________   LABS (all labs ordered are listed, but only abnormal results are displayed)  Labs Reviewed  CBC - Abnormal; Notable for the following:    WBC 3.2 (*)    RBC 3.27 (*)    Hemoglobin 9.9 (*)    HCT 29.7 (*)    RDW 17.8 (*)    All other components within normal limits  COMPREHENSIVE METABOLIC PANEL - Abnormal; Notable for the following:    Potassium 5.4 (*)    BUN 36 (*)    Creatinine, Ser 1.34 (*)    Calcium 8.8 (*)    GFR calc non Af Amer 50 (*)    GFR calc Af Amer 58 (*)    All other components within normal limits  URINALYSIS COMPLETEWITH MICROSCOPIC ([facility] ONLY) - Abnormal; Notable for the following:    Color, Urine YELLOW (*)    APPearance CLEAR (*)    Protein, ur 30 (*)    All other components within normal limits  TROPONIN I  BASIC METABOLIC PANEL   ____________________________________________  EKG  ED ECG REPORT I, Eula Listen, the attending physician, personally viewed and interpreted this ECG.   Date: 11/09/2015  EKG Time: 1626  Rate: 55  Rhythm: Sinus bradycardia  Axis: normal  Intervals:first-degree A-V block   ST&T Change:  Nonspecific T-wave inversion in V1. No ST elevation.  ____________________________________________  RADIOLOGY  No results  found.  ____________________________________________   PROCEDURES  Procedure(s) performed: None  Critical Care performed: No ____________________________________________   INITIAL IMPRESSION / ASSESSMENT AND PLAN / ED COURSE  Pertinent labs & imaging results that were available during my care of the patient were reviewed by me and considered in my medical decision making (see chart for details).  75 y.o. male with decreased liquid intake presenting with positional lightheadedness and an episode of hypotension at the urologist today. The most likely etiology of the patient's symptoms is hypovolemia, but I'll also evaluate him for arrhythmia, electrolyte abnormality, anemia, or infection. The patient may also have symptomatic bradycardia. He denies any recent changes in his medications.  ----------------------------------------- 8:08 PM on 11/09/2015 -----------------------------------------  Patient's workup continues to be consistent with some mild hypovolemia. He has a bump in his creatinine and a mild hyperkalemia. I will recheck his numbers after he was given a liter of fluid, but at this time he states he is feeling much better than upon arrival. On my reevaluation, the patient has a heart rate in the low 60s. He is in sinus rhythm. I do not see any evidence that he has ischemia. The only antihypertensive that this patient is on is Benicar, which does not have bradycardia as a side effect, although renal dysfunction and hypotension may occur in the setting of dehydration with this medication.  I will have the patient call Dr. Theda Sers office, who is his wife's cardiologist and preferred cardiology follow-up, for follow-up appointment. He will also follow up with his primary care physician. He understands return precautions as well as follow-up  instructions.  ____________________________________________  FINAL CLINICAL IMPRESSION(S) / ED DIAGNOSES  Final diagnoses:  Sinus  bradycardia  Postural lightheadedness  Dehydration  Renal insufficiency  Hyperkalemia      NEW MEDICATIONS STARTED DURING THIS VISIT:  New Prescriptions   No medications on file     Eula Listen, MD 11/09/15 2047

## 2015-11-09 NOTE — ED Notes (Signed)
Pt spouse reports that pt had low BP tonight and that pt felt dizzy - Pt was at the urologist today and they reported a BP of 90/50 and recommended that he go see his family doctor and they told him to come to the ER - Pt states he has felt dizzy for the last week or so (when standing up)

## 2015-11-09 NOTE — Progress Notes (Signed)
[name] follows up with kidney cysts. He was sent and had 2 large right renal cyst aspirated. He thought his symptoms improved. He underwent a follow-up renal ultrasound which revealed the right renal cyst which was 17 cm is down to 11.5 cm and the other upper pole cyst is now 6.7 cm. The left kidney contains 2 Bosniak cyst of 2.3 and 5 cm. I reviewed the images.  The patient has been well. As a side he has had some syncope. He has not fallen or passed out. He felt a bit dizzy when he stands up. His initial systolic blood pressure was in the 90s but we rechecked and his blood pressure was 100/62. He has no chest pain or shortness of breath. No headache.  The 13 system review of systems was obtained which was significant for intermittent stream, heartburn, bleeding/easy bruising, joint pain and dizziness.  He looks well. He is in no acute distress. He is alert and oriented 3. Neurologically there are no focal deficits.  Assessment/plan: Bilateral renal cysts-ultrasound in one year. Told him to call his primary care doctor and tell them his blood pressure is low and that his been having syncope.He said it's Dr. Steva Ready and they/re going to walk down there right now.

## 2015-11-09 NOTE — ED Notes (Signed)
Low blood pressure at doctor's office today. Dizziness x 1 week. L hand numbness x several days.

## 2015-12-10 ENCOUNTER — Other Ambulatory Visit: Payer: Self-pay | Admitting: Family Medicine

## 2015-12-11 ENCOUNTER — Inpatient Hospital Stay (HOSPITAL_BASED_OUTPATIENT_CLINIC_OR_DEPARTMENT_OTHER): Payer: Medicare Other | Admitting: Hematology and Oncology

## 2015-12-11 ENCOUNTER — Inpatient Hospital Stay: Payer: Medicare Other | Attending: Hematology and Oncology

## 2015-12-11 ENCOUNTER — Other Ambulatory Visit: Payer: Self-pay

## 2015-12-11 ENCOUNTER — Encounter: Payer: Self-pay | Admitting: Hematology and Oncology

## 2015-12-11 DIAGNOSIS — Z8673 Personal history of transient ischemic attack (TIA), and cerebral infarction without residual deficits: Secondary | ICD-10-CM | POA: Diagnosis not present

## 2015-12-11 DIAGNOSIS — D7581 Myelofibrosis: Secondary | ICD-10-CM

## 2015-12-11 DIAGNOSIS — Z87891 Personal history of nicotine dependence: Secondary | ICD-10-CM

## 2015-12-11 DIAGNOSIS — I10 Essential (primary) hypertension: Secondary | ICD-10-CM | POA: Diagnosis not present

## 2015-12-11 DIAGNOSIS — M1990 Unspecified osteoarthritis, unspecified site: Secondary | ICD-10-CM | POA: Insufficient documentation

## 2015-12-11 DIAGNOSIS — E785 Hyperlipidemia, unspecified: Secondary | ICD-10-CM

## 2015-12-11 DIAGNOSIS — D72819 Decreased white blood cell count, unspecified: Secondary | ICD-10-CM

## 2015-12-11 DIAGNOSIS — K219 Gastro-esophageal reflux disease without esophagitis: Secondary | ICD-10-CM | POA: Insufficient documentation

## 2015-12-11 DIAGNOSIS — Z79899 Other long term (current) drug therapy: Secondary | ICD-10-CM | POA: Insufficient documentation

## 2015-12-11 DIAGNOSIS — Z85820 Personal history of malignant melanoma of skin: Secondary | ICD-10-CM | POA: Diagnosis not present

## 2015-12-11 DIAGNOSIS — R161 Splenomegaly, not elsewhere classified: Secondary | ICD-10-CM | POA: Insufficient documentation

## 2015-12-11 LAB — CBC WITH DIFFERENTIAL/PLATELET
Basophils Absolute: 0 10*3/uL (ref 0–0.1)
Basophils Relative: 0 %
Eosinophils Absolute: 0 10*3/uL (ref 0–0.7)
Eosinophils Relative: 0 %
HCT: 30.5 % — ABNORMAL LOW (ref 40.0–52.0)
Hemoglobin: 10.3 g/dL — ABNORMAL LOW (ref 13.0–18.0)
Lymphocytes Relative: 25 %
Lymphs Abs: 0.9 10*3/uL — ABNORMAL LOW (ref 1.0–3.6)
MCH: 30.7 pg (ref 26.0–34.0)
MCHC: 33.7 g/dL (ref 32.0–36.0)
MCV: 91 fL (ref 80.0–100.0)
Monocytes Absolute: 0.5 10*3/uL (ref 0.2–1.0)
Monocytes Relative: 14 %
Neutro Abs: 2.1 10*3/uL (ref 1.4–6.5)
Neutrophils Relative %: 61 %
Platelets: 270 10*3/uL (ref 150–440)
RBC: 3.35 MIL/uL — ABNORMAL LOW (ref 4.40–5.90)
RDW: 18.1 % — ABNORMAL HIGH (ref 11.5–14.5)
WBC: 3.5 10*3/uL — ABNORMAL LOW (ref 3.8–10.6)

## 2015-12-11 LAB — COMPREHENSIVE METABOLIC PANEL
ALT: 29 U/L (ref 17–63)
AST: 31 U/L (ref 15–41)
Albumin: 4.4 g/dL (ref 3.5–5.0)
Alkaline Phosphatase: 92 U/L (ref 38–126)
Anion gap: 6 (ref 5–15)
BUN: 27 mg/dL — ABNORMAL HIGH (ref 6–20)
CO2: 25 mmol/L (ref 22–32)
Calcium: 9.4 mg/dL (ref 8.9–10.3)
Chloride: 110 mmol/L (ref 101–111)
Creatinine, Ser: 1.21 mg/dL (ref 0.61–1.24)
GFR calc Af Amer: >60 mL/min (ref >60)
GFR calc non Af Amer: 57 mL/min — ABNORMAL LOW (ref >60)
Glucose, Bld: 95 mg/dL (ref 65–99)
Potassium: 4.8 mmol/L (ref 3.5–5.1)
Sodium: 141 mmol/L (ref 135–145)
Total Bilirubin: 1.3 mg/dL — ABNORMAL HIGH (ref 0.3–1.2)
Total Protein: 6.9 g/dL (ref 6.5–8.1)

## 2015-12-11 LAB — LACTATE DEHYDROGENASE: LDH: 454 U/L — ABNORMAL HIGH (ref 98–192)

## 2015-12-11 NOTE — Progress Notes (Signed)
[place] [facility] day:  12/11/2015  Chief Complaint: [name] is a 75 y.o. male  with myelofibrosis who is seen for 6 month assessment.  HPI: The patient was last seen in the medical oncology clinic on 06/12/2015.  At that time, he was doing well. Exam revealed a palpable spleen tip.  Labs revealed mild stable anemia and leukopenia.  He has continued to alternate appointment at [facility].  He saw [name] on 10/01/2015.  CBC revealed a hematocrit of 32.1, hemoglobin 10.2, platelets 296,000, and white count 3300.  No circulating blasts were seen on peripheral smear. As his hematologic picture was stable and was felt no indication for this. His next follow-up visit is scheduled for 03/2016.  During the interim, he has continued to do well.  He goes to the [employer] to go fishing.  He denies any concerns. He feels "fine.    Past Medical History  Diagnosis Date  . Hypertension   . Prostate hypertrophy   . GERD (gastroesophageal reflux disease)   . Arthritis   . Stroke [facility]) June 2009    R brain subcortical infarct  . Hyperlipidemia   . Myelofibrosis ([place])   . History of retinal detachment   . Cancer (HCC)     hx of melanoma  . Heartburn     Past Surgical History  Procedure Laterality Date  . Varicose vein surgery    . Back surgery      approx 20- 25 years ago  . Scleral buckle  08/11/2011    Procedure: SCLERAL BUCKLE;  Surgeon: Hayden Pedro, MD;  Location: [place];  Service: Ophthalmology;  Laterality: Right;  . Gas insertion  08/11/2011    Procedure: INSERTION OF GAS;  Surgeon: Hayden Pedro, MD;  Location: [place];  Service: Ophthalmology;  Laterality: Right;  C3F8  . Aspiration / injection renal cyst  Feb 2017    Family History  Problem Relation Age of Onset  . Heart disease Father   . Diabetes Son   . Kidney disease Neg Hx   . Prostate cancer Neg Hx     Social History:  reports that he quit smoking about 5  years ago. His smoking use included Cigarettes. He smoked 0.25 packs per day. He has never used smokeless tobacco. He reports that he does not drink alcohol or use illicit drugs.  He works full-time as a Animator. He is going fishing at the [employer].  Allergies:  Allergies  Allergen Reactions  . Meloxicam Nausea And Vomiting    Current Medications: Current Outpatient Prescriptions  Medication Sig Dispense Refill  . atorvastatin (LIPITOR) 10 MG tablet Take 10 mg by mouth at bedtime.    Marland Kitchen BENICAR 40 MG tablet TAKE ONE TABLET EVERY DAY 90 tablet 0  . Cholecalciferol (VITAMIN D3) 10000 units capsule Take 10,000 Units by mouth 2 (two) times a week. Pt takes on Monday and Thursday.    . clopidogrel (PLAVIX) 75 MG tablet Take 75 mg by mouth daily.    Marland Kitchen gabapentin (NEURONTIN) 300 MG capsule Take 600 mg by mouth at bedtime.    Alben Deeds Palmetto (MULTI GINSENG & SAW PALMETTO) 500 MG CAPS Take 500 mg by mouth daily.    . methocarbamol (ROBAXIN) 500 MG tablet Take 500 mg by mouth at bedtime.    . Multiple Vitamin (MULITIVITAMIN WITH MINERALS) TABS Take 1 tablet by mouth daily.    . naproxen sodium (  ANAPROX) 220 MG tablet Take 440 mg by mouth 2 (two) times daily as needed (for pain).     Marland Kitchen omega-3 acid ethyl esters (LOVAZA) 1 G capsule Take 1 g by mouth 2 (two) times daily.    . ranitidine (ZANTAC) 150 MG tablet Take 150 mg by mouth 2 (two) times daily.    . tamsulosin (FLOMAX) 0.4 MG CAPS capsule Take 0.4 mg by mouth daily after supper.    Marland Kitchen VOLTAREN 1 % GEL Apply 1 application topically 4 (four) times daily as needed (for neck pain).      No current facility-administered medications for this visit.    Review of Systems:  GENERAL:  Feels good.  Active.  No fevers or sweats.  Weight down 2 pounds. PERFORMANCE STATUS (ECOG):  0 HEENT:  No visual changes, runny nose, sore throat, mouth sores or tenderness. Lungs: No shortness of breath or cough.  No hemoptysis. Cardiac:   No chest pain, palpitations, orthopnea, or PND. GI:  No nausea, vomiting, diarrhea, constipation, melena or hematochezia. GU:  No urgency, frequency, dysuria, or hematuria. Musculoskeletal:  No back pain.  No joint pain.  No muscle tenderness. Extremities:  No pain or swelling. Skin:  No rashes or skin changes. Neuro:  No headache, numbness or weakness, balance or coordination issues. Endocrine:  No diabetes, thyroid issues, hot flashes or night sweats. Psych:  No mood changes, depression or anxiety. Pain:  No focal pain. Review of systems:  All other systems reviewed and found to be negative.   Physical Exam: Blood pressure 113/79, pulse 58, temperature 97 F (36.1 C), height _0  (1.778 m), weight 192 lb (87.091 kg). GENERAL:  Well developed, well nourished, gentleman sitting comfortably in the exam room in no acute distress. MENTAL STATUS:  Alert and oriented to person, place and time. HEAD:  Short gray hair.  Normocephalic, atraumatic, face symmetric, no Cushingoid features. EYES:  Blue eyes.  Pupils equal round and reactive to light and accomodation.  No conjunctivitis or scleral icterus. ENT:  Oropharynx clear without lesion.  Tongue normal. Mucous membranes moist.  RESPIRATORY:  Clear to auscultation without rales, wheezes or rhonchi. CARDIOVASCULAR:  Regular rate and rhythm without murmur, rub or gallop. ABDOMEN:  Soft, non-tender, with active bowel sounds, and a palpable spleen tip (2 cm below costal margin).  No hepatomegaly.  No masses. SKIN:  No rashes, ulcers or lesions. EXTREMITIES: No edema, no skin discoloration or tenderness.  No palpable cords. LYMPH NODES: No palpable cervical, supraclavicular, axillary or inguinal adenopathy  NEUROLOGICAL: Unremarkable. PSYCH:  Appropriate.    Orders Only on 12/11/2015  Component Date Value Ref Range Status  . WBC 12/11/2015 3.5* 3.8 - 10.6 K/uL Final  . RBC 12/11/2015 3.35* 4.40 - 5.90 MIL/uL Final  . Hemoglobin 12/11/2015  10.3* 13.0 - 18.0 g/dL Final  . HCT 12/11/2015 30.5* 40.0 - 52.0 % Final  . MCV 12/11/2015 91.0  80.0 - 100.0 fL Final  . MCH 12/11/2015 30.7  26.0 - 34.0 pg Final  . MCHC 12/11/2015 33.7  32.0 - 36.0 g/dL Final  . RDW 12/11/2015 18.1* 11.5 - 14.5 % Final  . Platelets 12/11/2015 270  150 - 440 K/uL Final  . Neutrophils Relative % 12/11/2015 61   Final  . Neutro Abs 12/11/2015 2.1  1.4 - 6.5 K/uL Final  . Lymphocytes Relative 12/11/2015 25   Final  . Lymphs Abs 12/11/2015 0.9* 1.0 - 3.6 K/uL Final  . Monocytes Relative 12/11/2015 14   Final  .  Monocytes Absolute 12/11/2015 0.5  0.2 - 1.0 K/uL Final  . Eosinophils Relative 12/11/2015 0   Final  . Eosinophils Absolute 12/11/2015 0.0  0 - 0.7 K/uL Final  . Basophils Relative 12/11/2015 0   Final  . Basophils Absolute 12/11/2015 0.0  0 - 0.1 K/uL Final  . Sodium 12/11/2015 141  135 - 145 mmol/L Final  . Potassium 12/11/2015 4.8  3.5 - 5.1 mmol/L Final  . Chloride 12/11/2015 110  101 - 111 mmol/L Final  . CO2 12/11/2015 25  22 - 32 mmol/L Final  . Glucose, Bld 12/11/2015 95  65 - 99 mg/dL Final  . BUN 12/11/2015 27* 6 - 20 mg/dL Final  . Creatinine, Ser 12/11/2015 1.21  0.61 - 1.24 mg/dL Final  . Calcium 12/11/2015 9.4  8.9 - 10.3 mg/dL Final  . Total Protein 12/11/2015 6.9  6.5 - 8.1 g/dL Final  . Albumin 12/11/2015 4.4  3.5 - 5.0 g/dL Final  . AST 12/11/2015 31  15 - 41 U/L Final  . ALT 12/11/2015 29  17 - 63 U/L Final  . Alkaline Phosphatase 12/11/2015 92  38 - 126 U/L Final  . Total Bilirubin 12/11/2015 1.3* 0.3 - 1.2 mg/dL Final  . GFR calc non Af Amer 12/11/2015 57* >60 mL/min Final  . GFR calc Af Amer 12/11/2015 >60  >60 mL/min Final   Comment: (NOTE) The eGFR has been calculated using the CKD EPI equation. This calculation has not been validated in all clinical situations. eGFR's persistently <60 mL/min signify possible Chronic Kidney Disease.   . Anion gap 12/11/2015 6  5 - 15 Final  . LDH 12/11/2015 454* 98 - 192 U/L Final     Assessment:  [name] is a 75 y.o. 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  He presented with a long history of normocytic anemia unresponsive to oral iron.  He has mild leukopenia.  He has mild splenomegaly (15.8 cm) by abdominal ultrasound on 06/04/2015.  DIPPS score is 90 (age 72- 1, hemoglobin less than 10- 2) and score of 4 if 1% circulating blasts included from 07/2014.  Bone marrow on 06/11/2014 was most consistent with primary myelofibrosis.  Bone marrow biopsy showed 1% abnormal cells: CD45+, CD5+, CD10, CD11c+/-, CD19+, CD2-+, (dim), CD22+ (dim, CD23+, CD38-/+, FMC7-, HLA-DR+, sig lambda+(dim).  Blasts were not increased 1.2%; hypercellular for age: 79%; JAK2 with reflex pending; Flow included about 1% CLL/SLL phenotype cells (EQ6+) of uncertain significance amd some infiltrate into the marrow Increased atypical megakaryocytes.   Labs in 05/2014 included the following normal studies: direct [place], epo level, haptoglobin, ANA, hemoglobin electrophoresis, hepatitis B surface antigen, HIV testing, UPEP.  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  LDH was elevated (520). Hepatitis C by PCR was less than 2 IU/ml.   Symptomatically, he feels good. Exam reveals a palpable spleen tip.  Labs are stable.  Plan: 1.  Review interval labs. 2.  Labs today:  CBC with diff, CMP, LDH. 3.  Continue to alternate appointments here and at [facility]. 4.  RTC in 6 months for MD assessment and labs (CBC with diff, CMP, LDH).   Lequita Asal, MD  12/11/2015, 4:10 PM

## 2015-12-11 NOTE — Progress Notes (Signed)
Patient here for follow up no changes since last visit, Patient has seen [facility] MD since last visit.

## 2015-12-22 ENCOUNTER — Other Ambulatory Visit: Payer: Self-pay | Admitting: Family Medicine

## 2015-12-30 ENCOUNTER — Encounter: Payer: Self-pay | Admitting: Cardiovascular Disease

## 2015-12-30 ENCOUNTER — Ambulatory Visit (INDEPENDENT_AMBULATORY_CARE_PROVIDER_SITE_OTHER): Payer: Medicare Other | Admitting: Cardiovascular Disease

## 2015-12-30 VITALS — BP 110/64 | HR 65 | Ht 70.0 in | Wt 193.5 lb

## 2015-12-30 DIAGNOSIS — I10 Essential (primary) hypertension: Secondary | ICD-10-CM

## 2015-12-30 DIAGNOSIS — M542 Cervicalgia: Secondary | ICD-10-CM | POA: Diagnosis not present

## 2015-12-30 DIAGNOSIS — E785 Hyperlipidemia, unspecified: Secondary | ICD-10-CM

## 2015-12-30 DIAGNOSIS — D7581 Myelofibrosis: Secondary | ICD-10-CM

## 2015-12-30 DIAGNOSIS — I951 Orthostatic hypotension: Secondary | ICD-10-CM

## 2015-12-30 DIAGNOSIS — D649 Anemia, unspecified: Secondary | ICD-10-CM

## 2015-12-30 DIAGNOSIS — R42 Dizziness and giddiness: Secondary | ICD-10-CM | POA: Insufficient documentation

## 2015-12-30 NOTE — Patient Instructions (Addendum)
Medication Instructions:   Please cut the benicar in 1/2 daily Drink more fluids  If you continue to have lightheaded spells, Please call the office We would cut the benicar back to 10 mg dfaily     Follow-Up: It was a pleasure seeing you in the office today. Please call us if you have new issues that need to be addressed before your next appt.  [phone]  Your physician wants you to follow-up in: prn You will receive a reminder letter in the mail two months in advance. If you don't receive a letter, please call our office to schedule the follow-up appointment.  If you need a refill on your cardiac medications before your next appointment, please call your pharmacy.

## 2015-12-30 NOTE — Progress Notes (Signed)
Cardiology Office Note  Date:  12/30/2015   ID:  [name], DOB 06-19-1940, MRN [id number]  PCP:  Enid Derry, MD   Chief Complaint  Patient presents with  . Other    F/u ED due to elevated BP. Pt has no complaints today chest pain does not believe his problem is the Heart. Meds reviewed verbally with pt.    HPI:  [name] is a 75 y.o. male  with myelofibrosis diagnosed 06/11/2014.  long history of normocytic anemia unresponsive to oral iron. Had a CVA 3 to 4 years ago 6/09, received TPA Carotids normal,Mild intracranial atherosclerotic type change on MRI 2009 HTN,  Chronic neck pain, multilevel disc osteophyte protrusions with bilateral foraminal narrowing right more than left mainly at C4-5 and C5-6. Uses voltaren cream He presents today by self-referral for discussion of hypotension, lightheadedness  He reports that he has been working as a Associate Professor in the hot sun Often does not drink much fluids Several weeks ago was seen in the emergency room for lightheadedness, near syncope Lab work showed elevated BUN and creatinine, orthostasis He was given IV fluids, discharged home  He continues to have mild symptoms, not as bad He does not check his blood pressure at home, has been taking Benicar for many years Very active at baseline Takes 2 aleve daily, neck and hands  Lipids: total chol 104, LDL 54  BP 105/60 sitting on my check today  ER creatiine 1.34, repeat creatinine 1.2  EKG from the hospital reviewed showing normal sinus rhythm with no significant ST or T-wave changes  PMH:   has a past medical history of Arthritis; Cancer [facility]); GERD (gastroesophageal reflux disease); Heartburn; History of retinal detachment; Hyperlipidemia; Hypertension; Myelofibrosis ([place]); Prostate hypertrophy; and Stroke [facility]) (June 2009).  PSH:    Past Surgical History:  Procedure Laterality Date  . ASPIRATION / INJECTION RENAL CYST  Feb 2017  . BACK SURGERY     approx  20- 25 years ago  . GAS INSERTION  08/11/2011   Procedure: INSERTION OF GAS;  Surgeon: Hayden Pedro, MD;  Location: [place];  Service: Ophthalmology;  Laterality: Right;  C3F8  . SCLERAL BUCKLE  08/11/2011   Procedure: SCLERAL BUCKLE;  Surgeon: Hayden Pedro, MD;  Location: [place];  Service: Ophthalmology;  Laterality: Right;  Marland Kitchen VARICOSE VEIN SURGERY      Current Outpatient Prescriptions  Medication Sig Dispense Refill  . atorvastatin (LIPITOR) 10 MG tablet Take 10 mg by mouth at bedtime.    Marland Kitchen BENICAR 40 MG tablet TAKE ONE TABLET EVERY DAY 90 tablet 0  . cholecalciferol (VITAMIN D) 1000 units tablet Take 1,000 Units by mouth daily.    . clopidogrel (PLAVIX) 75 MG tablet Take 1 tablet (75 mg total) by mouth daily. 90 tablet 1  . gabapentin (NEURONTIN) 300 MG capsule Take 900 mg by mouth at bedtime.     Alben Deeds Palmetto (MULTI GINSENG & SAW PALMETTO) 500 MG CAPS Take 500 mg by mouth daily.    . methocarbamol (ROBAXIN) 500 MG tablet Take 500 mg by mouth at bedtime.    . Multiple Vitamin (MULITIVITAMIN WITH MINERALS) TABS Take 1 tablet by mouth daily.    . naproxen sodium (ANAPROX) 220 MG tablet Take 440 mg by mouth 2 (two) times daily as needed (for pain).     Marland Kitchen omega-3 acid ethyl esters (LOVAZA) 1 G capsule Take 1 g by mouth 2 (two) times daily.    . ranitidine (ZANTAC)  150 MG tablet Take 150 mg by mouth 2 (two) times daily.    . tamsulosin (FLOMAX) 0.4 MG CAPS capsule Take 0.4 mg by mouth daily after supper.    Marland Kitchen VOLTAREN 1 % GEL Apply 1 application topically 4 (four) times daily as needed (for neck pain).      No current facility-administered medications for this visit.      Allergies:   Meloxicam   Social History:  The patient  reports that he quit smoking about 5 years ago. His smoking use included Cigarettes. He smoked 0.25 packs per day. He has never used smokeless tobacco. He reports that he does not drink alcohol or use drugs.   Family History:   family history includes  Diabetes in his son; Heart disease in his father.    Review of Systems: Review of Systems  Constitutional: Negative.   Respiratory: Negative.   Cardiovascular: Negative.   Gastrointestinal: Negative.   Musculoskeletal: Negative.   Neurological: Positive for dizziness.  Psychiatric/Behavioral: Negative.   All other systems reviewed and are negative.    PHYSICAL EXAM: VS:  BP 110/64 (BP Location: Left Arm, Patient Position: Sitting, Cuff Size: Normal)   Pulse 65   Ht 5\' 10"  (1.778 m)   Wt 193 lb 8 oz (87.8 kg)   BMI 27.76 kg/m  , BMI Body mass index is 27.76 kg/m. GEN: Well nourished, well developed, in no acute distress  HEENT: normal  Neck: no JVD, carotid bruits, or masses Cardiac: RRR; no murmurs, rubs, or gallops,no edema  Respiratory:  clear to auscultation bilaterally, normal work of breathing GI: soft, nontender, nondistended, + BS MS: no deformity or atrophy  Skin: warm and dry, no rash Neuro:  Strength and sensation are intact Psych: euthymic mood, full affect    Recent Labs: 07/08/2015: TSH 2.700 12/11/2015: ALT 29; BUN 27; Creatinine, Ser 1.21; Hemoglobin 10.3; Platelets 270; Potassium 4.8; Sodium 141    Lipid Panel Lab Results  Component Value Date   CHOL 104 08/31/2015   HDL 33 (L) 08/31/2015   LDLCALC 54 08/31/2015   TRIG 87 08/31/2015      Wt Readings from Last 3 Encounters:  12/30/15 193 lb 8 oz (87.8 kg)  12/11/15 192 lb (87.1 kg)  11/09/15 193 lb (87.5 kg)       ASSESSMENT AND PLAN:  Myelofibrosis ([place]) Followed by oncology, chronic anemia but stable  Cervicalgia Chronic neck pain, does not want surgery MRI showing cervical disease, foraminal narrowing  Essential hypertension, benign Blood pressure running low, recommended he decrease Benicar down to 20 mg daily, trig more fluids. If blood pressure continues to run low with orthostatic symptoms, may need Benicar 10 mg daily  Dyslipidemia Encouraged him to stay on his  statin Previous atherosclerosis noted on prior MRA of the head  Anemia, unspecified anemia type Stable hematocrit around 30, reports he is asymptomatic  Orthostasis Recent orthostasis symptoms in the setting of dehydration, likely exacerbated by high-dose Benicar Encouraged fluids, decrease Benicar down to 20 mg daily   Total encounter time more than 45 minutes  Greater than 50% was spent in counseling and coordination of care with the patient   Disposition:   F/U prn    Signed, Esmond Plants, M.D., Ph.D. 12/30/2015  [place], [place]

## 2016-01-11 ENCOUNTER — Other Ambulatory Visit: Payer: Self-pay | Admitting: Family Medicine

## 2016-01-11 NOTE — Telephone Encounter (Signed)
July 2017 Cr and K+ normal; Rx approved

## 2016-01-18 DIAGNOSIS — L84 Corns and callosities: Secondary | ICD-10-CM | POA: Diagnosis not present

## 2016-01-18 DIAGNOSIS — D485 Neoplasm of uncertain behavior of skin: Secondary | ICD-10-CM | POA: Diagnosis not present

## 2016-01-18 DIAGNOSIS — L570 Actinic keratosis: Secondary | ICD-10-CM | POA: Diagnosis not present

## 2016-02-02 DIAGNOSIS — L814 Other melanin hyperpigmentation: Secondary | ICD-10-CM | POA: Diagnosis not present

## 2016-02-02 DIAGNOSIS — C44329 Squamous cell carcinoma of skin of other parts of face: Secondary | ICD-10-CM | POA: Diagnosis not present

## 2016-02-02 DIAGNOSIS — L578 Other skin changes due to chronic exposure to nonionizing radiation: Secondary | ICD-10-CM | POA: Diagnosis not present

## 2016-02-16 ENCOUNTER — Other Ambulatory Visit: Payer: Self-pay | Admitting: Family Medicine

## 2016-02-17 NOTE — Telephone Encounter (Signed)
Last sgpt reviewed; rx approved

## 2016-02-28 ENCOUNTER — Encounter: Payer: Self-pay | Admitting: *Deleted

## 2016-02-28 ENCOUNTER — Ambulatory Visit: Payer: Medicare Other

## 2016-02-28 ENCOUNTER — Ambulatory Visit
Admission: EM | Admit: 2016-02-28 | Discharge: 2016-02-28 | Disposition: A | Discharge status: Home / Self Care | Payer: Medicare Other | Attending: Family Medicine | Admitting: Family Medicine

## 2016-02-28 DIAGNOSIS — S43401A Unspecified sprain of right shoulder joint, initial encounter: Secondary | ICD-10-CM | POA: Diagnosis not present

## 2016-02-28 DIAGNOSIS — S0990XA Unspecified injury of head, initial encounter: Secondary | ICD-10-CM

## 2016-02-28 DIAGNOSIS — M25511 Pain in right shoulder: Secondary | ICD-10-CM | POA: Diagnosis not present

## 2016-02-28 DIAGNOSIS — S4991XA Unspecified injury of right shoulder and upper arm, initial encounter: Secondary | ICD-10-CM | POA: Diagnosis not present

## 2016-02-28 DIAGNOSIS — M19011 Primary osteoarthritis, right shoulder: Secondary | ICD-10-CM | POA: Insufficient documentation

## 2016-02-28 MED ORDER — HYDROCODONE-ACETAMINOPHEN 5-325 MG PO TABS: 1.0000 | ORAL_TABLET | Freq: Four times a day (QID) | ORAL | 0 refills | Status: DC | PRN
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
## 2016-03-07 DIAGNOSIS — Z23 Encounter for immunization: Secondary | ICD-10-CM | POA: Diagnosis not present

## 2016-03-15 ENCOUNTER — Other Ambulatory Visit: Payer: Self-pay | Admitting: Family Medicine

## 2016-03-22 ENCOUNTER — Other Ambulatory Visit: Payer: Self-pay | Admitting: Family Medicine

## 2016-03-22 NOTE — Telephone Encounter (Signed)
rx approved

## 2016-03-28 DIAGNOSIS — D630 Anemia in neoplastic disease: Secondary | ICD-10-CM | POA: Diagnosis not present

## 2016-03-28 DIAGNOSIS — R162 Hepatomegaly with splenomegaly, not elsewhere classified: Secondary | ICD-10-CM | POA: Diagnosis not present

## 2016-03-28 DIAGNOSIS — D471 Chronic myeloproliferative disease: Secondary | ICD-10-CM | POA: Diagnosis not present
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
## 2016-04-11 ENCOUNTER — Other Ambulatory Visit: Payer: Self-pay | Admitting: Family Medicine

## 2016-04-14 ENCOUNTER — Ambulatory Visit (INDEPENDENT_AMBULATORY_CARE_PROVIDER_SITE_OTHER): Payer: Medicare Other | Admitting: Family Medicine

## 2016-04-14 ENCOUNTER — Encounter: Payer: Self-pay | Admitting: Family Medicine

## 2016-04-14 VITALS — BP 144/64 | HR 64 | Temp 98.2°F | Ht 68.0 in | Wt 193.4 lb

## 2016-04-14 DIAGNOSIS — I129 Hypertensive chronic kidney disease with stage 1 through stage 4 chronic kidney disease, or unspecified chronic kidney disease: Secondary | ICD-10-CM | POA: Diagnosis not present

## 2016-04-14 DIAGNOSIS — I10 Essential (primary) hypertension: Secondary | ICD-10-CM

## 2016-04-14 DIAGNOSIS — E875 Hyperkalemia: Secondary | ICD-10-CM | POA: Diagnosis not present

## 2016-04-14 DIAGNOSIS — E785 Hyperlipidemia, unspecified: Secondary | ICD-10-CM

## 2016-04-14 DIAGNOSIS — R42 Dizziness and giddiness: Secondary | ICD-10-CM | POA: Diagnosis not present

## 2016-04-14 LAB — LIPID PANEL PICCOLO, WAIVED
Chol/HDL Ratio Piccolo,Waive: 3 mg/dL
Cholesterol Piccolo, Waived: 106 mg/dL (ref <200)
HDL Chol Piccolo, Waived: 35 mg/dL — ABNORMAL LOW (ref >59)
LDL Chol Calc Piccolo Waived: 51 mg/dL (ref <100)
Triglycerides Piccolo,Waived: 99 mg/dL (ref <150)
VLDL Chol Calc Piccolo,Waive: 20 mg/dL (ref <30)

## 2016-04-14 LAB — MICROALBUMIN, URINE WAIVED
Creatinine, Urine Waived: 100 mg/dL (ref 10–300)
Microalb, Ur Waived: 150 mg/L — ABNORMAL HIGH (ref 0–19)
Microalb/Creat Ratio: >300 mg/g — ABNORMAL HIGH (ref <30)

## 2016-04-14 MED ORDER — ATORVASTATIN CALCIUM 10 MG PO TABS: 10.0000 mg | ORAL_TABLET | Freq: Every day | ORAL | 1 refills | Status: DC

## 2016-04-14 MED ORDER — MECLIZINE HCL 25 MG PO TABS: 25.0000 mg | ORAL_TABLET | Freq: Three times a day (TID) | ORAL | 0 refills | Status: DC | PRN

## 2016-04-14 NOTE — Assessment & Plan Note (Signed)
Rechecking levels today. Await results.  

## 2016-04-14 NOTE — Assessment & Plan Note (Signed)
Under good control. Cholesterol doing well. Continue current regimen. Continue to monitor.
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
## 2016-04-15 ENCOUNTER — Telehealth: Payer: Self-pay | Admitting: Family Medicine

## 2016-04-15 DIAGNOSIS — E875 Hyperkalemia: Secondary | ICD-10-CM

## 2016-04-15 LAB — COMPREHENSIVE METABOLIC PANEL
ALT: 27 IU/L (ref 0–44)
AST: 27 IU/L (ref 0–40)
Albumin/Globulin Ratio: 2.6 — ABNORMAL HIGH (ref 1.2–2.2)
Albumin: 4.4 g/dL (ref 3.5–4.8)
Alkaline Phosphatase: 109 IU/L (ref 39–117)
BUN/Creatinine Ratio: 19 (ref 10–24)
BUN: 21 mg/dL (ref 8–27)
Bilirubin Total: 0.9 mg/dL (ref 0.0–1.2)
CO2: 25 mmol/L (ref 18–29)
Calcium: 9.3 mg/dL (ref 8.6–10.2)
Chloride: 102 mmol/L (ref 96–106)
Creatinine, Ser: 1.11 mg/dL (ref 0.76–1.27)
GFR calc Af Amer: 75 mL/min/{1.73_m2} (ref >59)
GFR calc non Af Amer: 65 mL/min/{1.73_m2} (ref >59)
Globulin, Total: 1.7 g/dL (ref 1.5–4.5)
Glucose: 103 mg/dL — ABNORMAL HIGH (ref 65–99)
Potassium: 5.5 mmol/L — ABNORMAL HIGH (ref 3.5–5.2)
Sodium: 140 mmol/L (ref 134–144)
Total Protein: 6.1 g/dL (ref 6.0–8.5)

## 2016-04-15 NOTE — Telephone Encounter (Signed)
Please let him know that his labs came back good but his potassium is high. He should avoid gatorade, any salt substitutes, bananas and citrus fruits and we'll check it next week to make sure it's coming back down.

## 2016-04-15 NOTE — Telephone Encounter (Signed)
Patient notified

## 2016-04-15 NOTE — Telephone Encounter (Signed)
Called patient, no answer, left a message for patient to return my call.  

## 2016-05-03 ENCOUNTER — Ambulatory Visit: Payer: Medicare Other | Admitting: Family Medicine

## 2016-05-16 ENCOUNTER — Encounter: Payer: Self-pay | Admitting: Family Medicine

## 2016-05-16 ENCOUNTER — Ambulatory Visit (INDEPENDENT_AMBULATORY_CARE_PROVIDER_SITE_OTHER): Payer: Medicare Other | Admitting: Family Medicine

## 2016-05-16 VITALS — BP 124/68 | HR 60 | Temp 98.0°F | Wt 195.8 lb

## 2016-05-16 DIAGNOSIS — R42 Dizziness and giddiness: Secondary | ICD-10-CM

## 2016-05-16 DIAGNOSIS — M7521 Bicipital tendinitis, right shoulder: Secondary | ICD-10-CM | POA: Diagnosis not present

## 2016-05-16 DIAGNOSIS — I129 Hypertensive chronic kidney disease with stage 1 through stage 4 chronic kidney disease, or unspecified chronic kidney disease: Secondary | ICD-10-CM | POA: Diagnosis not present

## 2016-05-16 MED ORDER — DICLOFENAC SODIUM 1 % TD GEL: 1.0000 "application " | Freq: Four times a day (QID) | TRANSDERMAL | 6 refills | Status: DC | PRN

## 2016-05-16 NOTE — Assessment & Plan Note (Signed)
Under good control on recheck. Continue current regimen. Continue to monitor. Call with any concerns.  

## 2016-05-16 NOTE — Progress Notes (Signed)
BP 124/68   Pulse 60   Temp 98 F (36.7 C)   Wt 195 lb 12.8 oz (88.8 kg)   SpO2 98%   BMI 29.77 kg/m    Subjective:    Patient ID: [name], male    DOB: 11-14-1940, 75 y.o.   MRN: [id number]  HPI: [name] is a 75 y.o. male  Chief Complaint  Patient presents with  . Hypertension  . Dizziness  . Shoulder Pain    Patient states that he had a fall a while back, went to UC in [place], did not have a fracture, he states that it is getting better, but when he tries to raise his arm while holding something in his hand he has trouble.    HYPERTENSION Hypertension status: controlled  Satisfied with current treatment? yes Duration of hypertension: chronic BP monitoring frequency:  not checking BP medication side effects:  no Medication compliance: excellent compliance Previous BP meds: benicar  Aspirin: yes Recurrent headaches: no Visual changes: no Palpitations: no Dyspnea: no Chest pain: no Lower extremity edema: no Dizzy/lightheaded: yes- a lot better! Feeling good.   SHOULDER PAIN- since September 28th- had a bad fall, had an x-ray and that was normal, still having a lot of pain Duration: 3 months Involved shoulder: right Mechanism of injury: trauma Location: lateral Onset:sudden Severity: severe  Quality:  sharp and stabbing, also dull ache Frequency: intermittent with lifting Radiation: no Aggravating factors: lifting  Alleviating factors: nothing  Status: better Treatments attempted: HEP  Relief with NSAIDs?:  mild Weakness: yes Numbness: no Decreased grip strength: no Redness: no Swelling: no Bruising: no Fevers: no  Relevant past medical, surgical, family and social history reviewed and updated as indicated. Interim medical history since our last visit reviewed. Allergies and medications reviewed and updated.  Review of Systems  Constitutional: Negative.   Respiratory: Negative.   Cardiovascular: Negative.   Musculoskeletal: Positive  for arthralgias and myalgias. Negative for back pain, gait problem, joint swelling, neck pain and neck stiffness.  Neurological: Negative.   Psychiatric/Behavioral: Negative.     Per HPI unless specifically indicated above     Objective:    BP 124/68   Pulse 60   Temp 98 F (36.7 C)   Wt 195 lb 12.8 oz (88.8 kg)   SpO2 98%   BMI 29.77 kg/m   Wt Readings from Last 3 Encounters:  05/16/16 195 lb 12.8 oz (88.8 kg)  04/14/16 193 lb 6.4 oz (87.7 kg)  02/28/16 192 lb (87.1 kg)    Physical Exam  Constitutional: He is oriented to person, place, and time. He appears well-developed and well-nourished. No distress.  HENT:  Head: Normocephalic and atraumatic.  Right Ear: Hearing normal.  Left Ear: Hearing normal.  Nose: Nose normal.  Eyes: Conjunctivae and lids are normal. Right eye exhibits no discharge. Left eye exhibits no discharge. No scleral icterus.  Cardiovascular: Normal rate, regular rhythm, normal heart sounds and intact distal pulses.  Exam reveals no gallop and no friction rub.   No murmur heard. Pulmonary/Chest: Effort normal and breath sounds normal. No respiratory distress. He has no wheezes. He has no rales. He exhibits no tenderness.  Musculoskeletal: Normal range of motion.  Neurological: He is alert and oriented to person, place, and time.  Skin: Skin is warm, dry and intact. No rash noted. He is not diaphoretic. No erythema. No pallor.  Psychiatric: He has a normal mood and affect. His speech is normal and behavior is normal.  Judgment and thought content normal. Cognition and memory are normal.  Nursing note and vitals reviewed.    Shoulder: right    Inspection:  no swelling, ecchymosis, erythema or step off deformity.    Tenderness to Palpation:    Acromion: no    AC joint:no    Clavicle: no    Bicipital groove: yes    Scapular spine: no    Coracoid process: no    Humeral head: no    Supraspinatus tendon: no     Range of Motion:  Full Range of Motion  bilaterally      Muscle Strength: 5/5 bilaterally     Neuro: Sensation WNL. and Upper extremity reflexes WNL.     Special Tests:     Neer sign: Negative    Hawkins sign: Negative    Cross arm adduction: Negative    Yergason sign: Positive    O'brien sign: Negative     Speed sign: Negative   Results for orders placed or performed in visit on 04/14/16  Comprehensive metabolic panel  Result Value Ref Range   Glucose 103 (H) 65 - 99 mg/dL   BUN 21 8 - 27 mg/dL   Creatinine, Ser 1.11 0.76 - 1.27 mg/dL   GFR calc non Af Amer 65 >59 mL/min/1.73   GFR calc Af Amer 75 >59 mL/min/1.73   BUN/Creatinine Ratio 19 10 - 24   Sodium 140 134 - 144 mmol/L   Potassium 5.5 (H) 3.5 - 5.2 mmol/L   Chloride 102 96 - 106 mmol/L   CO2 25 18 - 29 mmol/L   Calcium 9.3 8.6 - 10.2 mg/dL   Total Protein 6.1 6.0 - 8.5 g/dL   Albumin 4.4 3.5 - 4.8 g/dL   Globulin, Total 1.7 1.5 - 4.5 g/dL   Albumin/Globulin Ratio 2.6 (H) 1.2 - 2.2   Bilirubin Total 0.9 0.0 - 1.2 mg/dL   Alkaline Phosphatase 109 39 - 117 IU/L   AST 27 0 - 40 IU/L   ALT 27 0 - 44 IU/L  Lipid Panel Piccolo, Waived  Result Value Ref Range   Cholesterol Piccolo, Waived 106 <200 mg/dL   HDL Chol Piccolo, Waived 35 (L) >59 mg/dL   Triglycerides Piccolo,Waived 99 <150 mg/dL   Chol/HDL Ratio Piccolo,Waive 3.0 mg/dL   LDL Chol Calc Piccolo Waived 51 <100 mg/dL   VLDL Chol Calc Piccolo,Waive 20 <30 mg/dL  Microalbumin, Urine Waived  Result Value Ref Range   Microalb, Ur Waived 150 (H) 0 - 19 mg/L   Creatinine, Urine Waived 100 10 - 300 mg/dL   Microalb/Creat Ratio >300 (H) <30 mg/g      Assessment & Plan:   Problem List Items Addressed This Visit      Genitourinary   Benign hypertensive renal disease - Primary    Under good control on recheck. Continue current regimen. Continue to monitor. Call with any concerns.        Other Visit Diagnoses    Biceps tendinitis of right upper extremity       Will use voltaren and will start  exercises- given today. Follow up 1-2 months. Call with any concerns.    Vertigo       Resolved! Call with any concerns.        Follow up plan: Return March? Wellness.

## 2016-06-12 NOTE — Progress Notes (Deleted)
[facility] day:  06/12/16  Chief Complaint: [name] is a 76 y.o. male  with myelofibrosis who is seen for 6 month assessment.  HPI: The patient was last seen in the medical oncology clinic on 12/11/2015.  At that time, he was doing well. Exam revealed a palpable spleen tip.  Labs revealed mild stable anemia and leukopenia.  He has continued to alternate appointment at [facility].  He saw [name] on 03/28/2016.  At that time, he was doing well.  He denied any symptoms.  Exam was stable.  CBC revealed a hematocrit of 31.7, hemoglobin 10.1, platelets 267,000, and white count 3100.  No circulating blasts were seen on peripheral smear.  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  His next follow-up visit is scheduled for 10/10/2016.  During the interim,    Past Medical History:  Diagnosis Date  . Arthritis   . Benign hypertensive renal disease   . Cancer (HCC)    hx of melanoma  . GERD (gastroesophageal reflux disease)   . Heartburn   . History of retinal detachment   . Hyperlipidemia   . Hypertension   . Myelofibrosis ([place])   . Prostate hypertrophy   . Stroke [facility]) June 2009   R brain subcortical infarct    Past Surgical History:  Procedure Laterality Date  . ASPIRATION / INJECTION RENAL CYST  Feb 2017  . BACK SURGERY     approx 20- 25 years ago  . GAS INSERTION  08/11/2011   Procedure: INSERTION OF GAS;  Surgeon: Hayden Pedro, MD;  Location: [place];  Service: Ophthalmology;  Laterality: Right;  [id number]  . SCLERAL BUCKLE  08/11/2011   Procedure: SCLERAL BUCKLE;  Surgeon: Hayden Pedro, MD;  Location: [place];  Service: Ophthalmology;  Laterality: Right;  Marland Kitchen VARICOSE VEIN SURGERY      Family History  Problem Relation Age of Onset  . Heart disease Father   . Diabetes Son   . Kidney disease Neg Hx   . Prostate cancer Neg Hx     Social History:  reports that he quit smoking about 6 years ago. His  smoking use included Cigarettes. He smoked 0.25 packs per day. He has never used smokeless tobacco. He reports that he does not drink alcohol or use drugs.  He works full-time as a Animator. He is going fishing at the [employer].  Allergies:  Allergies  Allergen Reactions  . Meloxicam Nausea And Vomiting    Current Medications: Current Outpatient Prescriptions  Medication Sig Dispense Refill  . atorvastatin (LIPITOR) 10 MG tablet Take 1 tablet (10 mg total) by mouth at bedtime. 90 tablet 1  . BENICAR 40 MG tablet Take 1 tablet (40 mg total) by mouth daily. 90 tablet 1  . chlorpheniramine (CHLOR-TRIMETON) 4 MG tablet Take 4 mg by mouth 2 (two) times daily as needed for allergies.    . cholecalciferol (VITAMIN D) 1000 units tablet Take 1,000 Units by mouth daily.    . clopidogrel (PLAVIX) 75 MG tablet TAKE ONE TABLET EVERY DAY 90 tablet 1  . diclofenac sodium (VOLTAREN) 1 % GEL Apply 1 application topically 4 (four) times daily as needed (for neck pain). 100 g 6  . gabapentin (NEURONTIN) 300 MG capsule TAKE 1 CAPSULE BY MOUTH 3 TIMES DAILY 270 capsule 3  . Ginsengs-Saw Palmetto (MULTI GINSENG & SAW PALMETTO) 500 MG CAPS Take 500 mg by mouth daily.    Marland Kitchen  meclizine (ANTIVERT) 25 MG tablet Take 1 tablet (25 mg total) by mouth 3 (three) times daily as needed for dizziness. 90 tablet 0  . methocarbamol (ROBAXIN) 500 MG tablet Take 500 mg by mouth at bedtime.    . Multiple Vitamin (MULITIVITAMIN WITH MINERALS) TABS Take 1 tablet by mouth daily.    . naproxen sodium (ANAPROX) 220 MG tablet Take 440 mg by mouth 2 (two) times daily as needed (for pain).     Marland Kitchen omega-3 acid ethyl esters (LOVAZA) 1 G capsule Take 1 g by mouth 2 (two) times daily.    . ranitidine (ZANTAC) 150 MG tablet Take 150 mg by mouth 2 (two) times daily.    . tamsulosin (FLOMAX) 0.4 MG CAPS capsule TAKE 1 CAPSULE BY MOUTH EVERY DAY 30 capsule 11   No current facility-administered medications for this visit.      Review of Systems:  GENERAL:  Feels good.  Active.  No fevers or sweats.  Weight down 2 pounds. PERFORMANCE STATUS (ECOG):  0 HEENT:  No visual changes, runny nose, sore throat, mouth sores or tenderness. Lungs: No shortness of breath or cough.  No hemoptysis. Cardiac:  No chest pain, palpitations, orthopnea, or PND. GI:  No nausea, vomiting, diarrhea, constipation, melena or hematochezia. GU:  No urgency, frequency, dysuria, or hematuria. Musculoskeletal:  No back pain.  No joint pain.  No muscle tenderness. Extremities:  No pain or swelling. Skin:  No rashes or skin changes. Neuro:  No headache, numbness or weakness, balance or coordination issues. Endocrine:  No diabetes, thyroid issues, hot flashes or night sweats. Psych:  No mood changes, depression or anxiety. Pain:  No focal pain. Review of systems:  All other systems reviewed and found to be negative.   Physical Exam: There were no vitals taken for this visit. GENERAL:  Well developed, well nourished, gentleman sitting comfortably in the exam room in no acute distress. MENTAL STATUS:  Alert and oriented to person, place and time. HEAD:  Short gray hair.  Normocephalic, atraumatic, face symmetric, no Cushingoid features. EYES:  Blue eyes.  Pupils equal round and reactive to light and accomodation.  No conjunctivitis or scleral icterus. ENT:  Oropharynx clear without lesion.  Tongue normal. Mucous membranes moist.  RESPIRATORY:  Clear to auscultation without rales, wheezes or rhonchi. CARDIOVASCULAR:  Regular rate and rhythm without murmur, rub or gallop. ABDOMEN:  Soft, non-tender, with active bowel sounds, and a palpable spleen tip (2 cm below costal margin).  No hepatomegaly.  No masses. SKIN:  No rashes, ulcers or lesions. EXTREMITIES: No edema, no skin discoloration or tenderness.  No palpable cords. LYMPH NODES: No palpable cervical, supraclavicular, axillary or inguinal adenopathy  NEUROLOGICAL: Unremarkable. PSYCH:   Appropriate.    No visits with results within 3 Day(s) from this visit.  Latest known visit with results is:  Office Visit on 04/14/2016  Component Date Value Ref Range Status  . Glucose 04/15/2016 103* 65 - 99 mg/dL Final  . BUN 04/15/2016 21  8 - 27 mg/dL Final  . Creatinine, Ser 04/15/2016 1.11  0.76 - 1.27 mg/dL Final  . GFR calc non Af Amer 04/15/2016 65  >59 mL/min/1.73 Final  . GFR calc Af Amer 04/15/2016 75  >59 mL/min/1.73 Final  . BUN/Creatinine Ratio 04/15/2016 19  10 - 24 Final  . Sodium 04/15/2016 140  134 - 144 mmol/L Final  . Potassium 04/15/2016 5.5* 3.5 - 5.2 mmol/L Final  . Chloride 04/15/2016 102  96 - 106 mmol/L Final  .  CO2 04/15/2016 25  18 - 29 mmol/L Final  . Calcium 04/15/2016 9.3  8.6 - 10.2 mg/dL Final  . Total Protein 04/15/2016 6.1  6.0 - 8.5 g/dL Final  . Albumin 04/15/2016 4.4  3.5 - 4.8 g/dL Final  . Globulin, Total 04/15/2016 1.7  1.5 - 4.5 g/dL Final  . Albumin/Globulin Ratio 04/15/2016 2.6* 1.2 - 2.2 Final  . Bilirubin Total 04/15/2016 0.9  0.0 - 1.2 mg/dL Final  . Alkaline Phosphatase 04/15/2016 109  39 - 117 IU/L Final  . AST 04/15/2016 27  0 - 40 IU/L Final  . ALT 04/15/2016 27  0 - 44 IU/L Final  . Cholesterol Piccolo, Waived 04/14/2016 106  <200 mg/dL Final   Comment:                         Desirable                <200                         Borderline High      200- 239                         High                     >239   . HDL Chol Piccolo, Waived 04/14/2016 35* >59 mg/dL Final   Comment:                         Low HDL- Risk Factor     < 40                         High HDL- Negative       > 59                          Risk Factor (Desirable)   . Triglycerides Piccolo,Waived 04/14/2016 99  <150 mg/dL Final   Comment:                         Normal                   <150                         Borderline High     150 - 199                         High                200 - 499                         Very High                 >499   . Chol/HDL Ratio Piccolo,Waive 04/14/2016 3.0  mg/dL Final   Comment:                                        Male      Male  Low Risk      < 5.0      < 4.5                         High Risk     > 4.9      > 4.4   . LDL Chol Calc Piccolo Waived 04/14/2016 51  <100 mg/dL Final   Comment:                         Optimal                  <100                         Near Optimal        100 - 129                         Borderline High     130 - 159                         High                160 - 189                         Very High                >189   . VLDL Chol Calc Piccolo,Waive 04/14/2016 20  <30 mg/dL Final   Comment:                         Normal                   < 30                         High                     > 29   . Microalb, Ur Waived 04/14/2016 150* 0 - 19 mg/L Final  . Creatinine, Urine Waived 04/14/2016 100  10 - 300 mg/dL Final  . 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  He presented with a long history of normocytic anemia unresponsive to oral iron.  He has mild leukopenia.  He has mild splenomegaly (15.8 cm) by abdominal ultrasound on 06/04/2015.  DIPPS score is 35 (age 71- 1, hemoglobin less than 10- 2) and score of 4 if 1% circulating blasts included from 07/17/2014.  Bone marrow on 06/11/2014 was most consistent with primary myelofibrosis.  Bone marrow biopsy showed 1% abnormal cells: CD45+, CD5+, CD10, CD11c+/-, CD19+, CD2-+, (dim), CD22+ (dim, CD23+, CD38-/+, FMC7-, HLA-DR+, sig lambda+(dim).  Blasts were not increased 1.2%; hypercellular for age: 57%; JAK2 V617F mutation was negative.  CALR mutation positive.  Flow cytometry included about 1% CLL/SLL phenotype cells (AY3+) of uncertain significance and  some infiltrate into the marrow with increased atypical megakaryocytes.  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  MDS FISH panel was negative.  Labs in 05/2014 included the following normal studies: direct [place], epo level, haptoglobin, ANA, hemoglobin electrophoresis, hepatitis B surface antigen, HIV testing, UPEP.  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  LDH was elevated (520). Hepatitis C by PCR was less than 2 IU/ml.   Symptomatically, he feels good. Exam reveals a palpable spleen tip.  Labs are stable.  Plan: 1.  Review interval labs. 2.  Labs today:  CBC with diff, CMP, LDH. 3.  Continue to alternate appointments here and at [facility]. 4.  RTC in 6 months for MD assessment and labs (CBC with diff, CMP, LDH).   Lequita Asal, MD  06/12/2016, 10:14 PM

## 2016-06-13 ENCOUNTER — Inpatient Hospital Stay: Payer: Medicare Other | Admitting: Hematology and Oncology

## 2016-06-13 ENCOUNTER — Inpatient Hospital Stay: Payer: Medicare Other

## 2016-06-23 ENCOUNTER — Ambulatory Visit: Payer: Medicare Other | Admitting: Hematology and Oncology

## 2016-06-23 ENCOUNTER — Other Ambulatory Visit: Payer: Medicare Other

## 2016-06-27 ENCOUNTER — Encounter: Payer: Self-pay | Admitting: Hematology and Oncology

## 2016-06-27 ENCOUNTER — Inpatient Hospital Stay: Payer: Medicare Other | Attending: Hematology and Oncology

## 2016-06-27 ENCOUNTER — Inpatient Hospital Stay (HOSPITAL_BASED_OUTPATIENT_CLINIC_OR_DEPARTMENT_OTHER): Payer: Medicare Other | Admitting: Hematology and Oncology

## 2016-06-27 VITALS — BP 129/72 | HR 73 | Temp 95.4°F | Resp 18 | Wt 191.6 lb

## 2016-06-27 DIAGNOSIS — E785 Hyperlipidemia, unspecified: Secondary | ICD-10-CM | POA: Diagnosis not present

## 2016-06-27 DIAGNOSIS — Z79899 Other long term (current) drug therapy: Secondary | ICD-10-CM

## 2016-06-27 DIAGNOSIS — D72819 Decreased white blood cell count, unspecified: Secondary | ICD-10-CM | POA: Diagnosis not present

## 2016-06-27 DIAGNOSIS — Z8673 Personal history of transient ischemic attack (TIA), and cerebral infarction without residual deficits: Secondary | ICD-10-CM

## 2016-06-27 DIAGNOSIS — N400 Enlarged prostate without lower urinary tract symptoms: Secondary | ICD-10-CM | POA: Diagnosis not present

## 2016-06-27 DIAGNOSIS — I10 Essential (primary) hypertension: Secondary | ICD-10-CM | POA: Diagnosis not present

## 2016-06-27 DIAGNOSIS — K219 Gastro-esophageal reflux disease without esophagitis: Secondary | ICD-10-CM | POA: Insufficient documentation

## 2016-06-27 DIAGNOSIS — D7581 Myelofibrosis: Secondary | ICD-10-CM

## 2016-06-27 DIAGNOSIS — R6881 Early satiety: Secondary | ICD-10-CM | POA: Diagnosis not present

## 2016-06-27 DIAGNOSIS — Z7902 Long term (current) use of antithrombotics/antiplatelets: Secondary | ICD-10-CM | POA: Insufficient documentation

## 2016-06-27 DIAGNOSIS — R161 Splenomegaly, not elsewhere classified: Secondary | ICD-10-CM | POA: Insufficient documentation

## 2016-06-27 DIAGNOSIS — Z85820 Personal history of malignant melanoma of skin: Secondary | ICD-10-CM | POA: Insufficient documentation

## 2016-06-27 DIAGNOSIS — Z87891 Personal history of nicotine dependence: Secondary | ICD-10-CM | POA: Insufficient documentation

## 2016-06-27 DIAGNOSIS — D649 Anemia, unspecified: Secondary | ICD-10-CM | POA: Insufficient documentation

## 2016-06-27 DIAGNOSIS — D801 Nonfamilial hypogammaglobulinemia: Secondary | ICD-10-CM | POA: Diagnosis not present

## 2016-06-27 LAB — COMPREHENSIVE METABOLIC PANEL
ALT: 27 U/L (ref 17–63)
AST: 31 U/L (ref 15–41)
Albumin: 4 g/dL (ref 3.5–5.0)
Alkaline Phosphatase: 102 U/L (ref 38–126)
Anion gap: 6 (ref 5–15)
BUN: 22 mg/dL — ABNORMAL HIGH (ref 6–20)
CO2: 25 mmol/L (ref 22–32)
Calcium: 8.8 mg/dL — ABNORMAL LOW (ref 8.9–10.3)
Chloride: 109 mmol/L (ref 101–111)
Creatinine, Ser: 0.99 mg/dL (ref 0.61–1.24)
GFR calc Af Amer: >60 mL/min (ref >60)
GFR calc non Af Amer: >60 mL/min (ref >60)
Glucose, Bld: 114 mg/dL — ABNORMAL HIGH (ref 65–99)
Potassium: 4.6 mmol/L (ref 3.5–5.1)
Sodium: 140 mmol/L (ref 135–145)
Total Bilirubin: 0.8 mg/dL (ref 0.3–1.2)
Total Protein: 7 g/dL (ref 6.5–8.1)

## 2016-06-27 LAB — CBC WITH DIFFERENTIAL/PLATELET
Basophils Absolute: 0 10*3/uL (ref 0–0.1)
Basophils Relative: 1 %
Eosinophils Absolute: 0 10*3/uL (ref 0–0.7)
Eosinophils Relative: 1 %
HCT: 27.8 % — ABNORMAL LOW (ref 40.0–52.0)
Hemoglobin: 9.3 g/dL — ABNORMAL LOW (ref 13.0–18.0)
Lymphocytes Relative: 18 %
Lymphs Abs: 0.7 10*3/uL — ABNORMAL LOW (ref 1.0–3.6)
MCH: 30.1 pg (ref 26.0–34.0)
MCHC: 33.4 g/dL (ref 32.0–36.0)
MCV: 90 fL (ref 80.0–100.0)
Monocytes Absolute: 0.4 10*3/uL (ref 0.2–1.0)
Monocytes Relative: 9 %
Neutro Abs: 2.8 10*3/uL (ref 1.4–6.5)
Neutrophils Relative %: 71 %
Platelets: 302 10*3/uL (ref 150–440)
RBC: 3.08 MIL/uL — ABNORMAL LOW (ref 4.40–5.90)
RDW: 18.5 % — ABNORMAL HIGH (ref 11.5–14.5)
WBC: 3.9 10*3/uL (ref 3.8–10.6)

## 2016-06-27 LAB — LACTATE DEHYDROGENASE: LDH: 424 U/L — ABNORMAL HIGH (ref 98–192)

## 2016-06-27 NOTE — Progress Notes (Signed)
Patient offers no complaints today.  Patient seen @ [facility] 3 months ago.  Everything was good and patient was told to continue what he is currently doing.

## 2016-06-27 NOTE — Progress Notes (Signed)
[facility] day:  06/27/16  Chief Complaint: [name] is a 76 y.o. male  with myelofibrosis who is seen for 6 month assessment.  HPI: The patient was last seen in the medical oncology clinic on 12/11/2015.  At that time, he was doing well. Exam revealed a palpable spleen tip.  Labs revealed mild stable anemia and leukopenia.  He has continued to alternate appointment at [facility].  He saw [name] on 03/28/2016.  At that time, he was doing well.  He denied any symptoms.  Exam was stable.  CBC revealed a hematocrit of 31.7, hemoglobin 10.1, platelets 267,000, and white count 3100.  No circulating blasts were seen on peripheral smear.  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  His next follow-up visit is scheduled for 10/10/2016.  During the interim, he had a cold.  He denies any B symptoms, bruising or bleeding.  He received the influenza vaccine.  He has early satiety.  He comments that his left thumb is numb secondary to his neck.   Past Medical History:  Diagnosis Date  . Arthritis   . Benign hypertensive renal disease   . Cancer (HCC)    hx of melanoma  . GERD (gastroesophageal reflux disease)   . Heartburn   . History of retinal detachment   . Hyperlipidemia   . Hypertension   . Myelofibrosis ([place])   . Prostate hypertrophy   . 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 ASPIRATION / INJECTION RENAL CYST  Feb 2017  . BACK SURGERY     approx 20- 25 years ago  . GAS INSERTION  08/11/2011   Procedure: INSERTION OF GAS;  Surgeon: Hayden Pedro, MD;  Location: [place];  Service: Ophthalmology;  Laterality: Right;  C3F8  . SCLERAL BUCKLE  08/11/2011   Procedure: SCLERAL BUCKLE;  Surgeon: Hayden Pedro, MD;  Location: [place];  Service: Ophthalmology;  Laterality: Right;  Marland Kitchen VARICOSE VEIN SURGERY      Family History  Problem Relation Age  of Onset  . Heart disease Father   . Diabetes Son   . Kidney disease Neg Hx   . Prostate cancer Neg Hx     Social History:  reports that he quit smoking about 6 years ago. His smoking use included Cigarettes. He smoked 0.25 packs per day. He has never used smokeless tobacco. He reports that he does not drink alcohol or use drugs.  He works full-time as a Animator. He lives in [place].  He is alone today.  Allergies:  Allergies  Allergen Reactions  . Meloxicam Nausea And Vomiting    Current Medications: Current Outpatient Prescriptions  Medication Sig Dispense Refill  . atorvastatin (LIPITOR) 10 MG tablet Take 1 tablet (10 mg total) by mouth at bedtime. 90 tablet 1  . BENICAR 40 MG tablet Take 1 tablet (40 mg total) by mouth daily. 90 tablet 1  . chlorpheniramine (CHLOR-TRIMETON) 4 MG tablet Take 4 mg by mouth 2 (two) times daily as needed for allergies.    . cholecalciferol (VITAMIN D) 1000 units tablet Take 1,000 Units by mouth daily.    . clopidogrel (PLAVIX) 75 MG tablet TAKE ONE TABLET EVERY DAY 90 tablet 1  . diclofenac sodium (VOLTAREN) 1 % GEL Apply 1 application topically 4 (four) times daily as needed (for neck pain). 100 g 6  .  gabapentin (NEURONTIN) 300 MG capsule TAKE 1 CAPSULE BY MOUTH 3 TIMES DAILY 270 capsule 3  . Ginsengs-Saw Palmetto (MULTI GINSENG & SAW PALMETTO) 500 MG CAPS Take 500 mg by mouth daily.    . meclizine (ANTIVERT) 25 MG tablet Take 1 tablet (25 mg total) by mouth 3 (three) times daily as needed for dizziness. 90 tablet 0  . methocarbamol (ROBAXIN) 500 MG tablet Take 500 mg by mouth at bedtime.    . Multiple Vitamin (MULITIVITAMIN WITH MINERALS) TABS Take 1 tablet by mouth daily.    . naproxen sodium (ANAPROX) 220 MG tablet Take 440 mg by mouth 2 (two) times daily as needed (for pain).     Marland Kitchen omega-3 acid ethyl esters (LOVAZA) 1 G capsule Take 1 g by mouth 2 (two) times daily.    . ranitidine (ZANTAC) 150 MG tablet Take 150 mg by mouth 2 (two)  times daily.    . tamsulosin (FLOMAX) 0.4 MG CAPS capsule TAKE 1 CAPSULE BY MOUTH EVERY DAY 30 capsule 11   No current facility-administered medications for this visit.     Review of Systems:  GENERAL:  Feels good.  Active.  No fevers or sweats.  Weight down 1 pounds. PERFORMANCE STATUS (ECOG):  0 HEENT:  No visual changes, runny nose, sore throat, mouth sores or tenderness. Lungs: No shortness of breath or cough.  No hemoptysis. Cardiac:  No chest pain, palpitations, orthopnea, or PND. GI:  No nausea, vomiting, diarrhea, constipation, melena or hematochezia. GU:  No urgency, frequency, dysuria, or hematuria. Musculoskeletal:  No back pain.  No joint pain.  No muscle tenderness. Extremities:  No pain or swelling. Skin:  No rashes or skin changes. Neuro:  Left thumb numb (see HPI).  No headache, numbness or weakness, balance or coordination issues. Endocrine:  No diabetes, thyroid issues, hot flashes or night sweats. Psych:  No mood changes, depression or anxiety. Pain:  No focal pain. Review of systems:  All other systems reviewed and found to be negative.   Physical Exam: Blood pressure 129/72, pulse 73, temperature (!) 95.4 F (35.2 C), temperature source Tympanic, resp. rate 18, weight 191 lb 9.3 oz (86.9 kg). GENERAL:  Well developed, well nourished, gentleman sitting comfortably in the exam room in no acute distress. MENTAL STATUS:  Alert and oriented to person, place and time. HEAD:  Short gray hair.  Normocephalic, atraumatic, face symmetric, no Cushingoid features. EYES:  Blue eyes.  Pupils equal round and reactive to light and accomodation.  No conjunctivitis or scleral icterus. ENT:  Oropharynx clear without lesion.  Tongue normal. Mucous membranes moist.  RESPIRATORY:  Clear to auscultation without rales, wheezes or rhonchi. CARDIOVASCULAR:  Regular rate and rhythm without murmur, rub or gallop. ABDOMEN:  Soft, non-tender, with active bowel sounds, and a palpable spleen  tip (2 cm below costal margin).  No hepatomegaly.  No masses. SKIN:  No rashes, ulcers or lesions. EXTREMITIES: No edema, no skin discoloration or tenderness.  No palpable cords. LYMPH NODES: No palpable cervical, supraclavicular, axillary or inguinal adenopathy  NEUROLOGICAL: Unremarkable. PSYCH:  Appropriate.    Appointment on 06/27/2016  Component Date Value Ref Range Status  . WBC 06/27/2016 3.9  3.8 - 10.6 K/uL Final  . RBC 06/27/2016 3.08* 4.40 - 5.90 MIL/uL Final  . Hemoglobin 06/27/2016 9.3* 13.0 - 18.0 g/dL Final  . HCT 06/27/2016 27.8* 40.0 - 52.0 % Final  . MCV 06/27/2016 90.0  80.0 - 100.0 fL Final  . MCH 06/27/2016 30.1  26.0 -  34.0 pg Final  . MCHC 06/27/2016 33.4  32.0 - 36.0 g/dL Final  . RDW 06/27/2016 18.5* 11.5 - 14.5 % Final  . Platelets 06/27/2016 302  150 - 440 K/uL Final  . Neutrophils Relative % 06/27/2016 71  % Final  . Neutro Abs 06/27/2016 2.8  1.4 - 6.5 K/uL Final  . Lymphocytes Relative 06/27/2016 18  % Final  . Lymphs Abs 06/27/2016 0.7* 1.0 - 3.6 K/uL Final  . Monocytes Relative 06/27/2016 9  % Final  . Monocytes Absolute 06/27/2016 0.4  0.2 - 1.0 K/uL Final  . Eosinophils Relative 06/27/2016 1  % Final  . Eosinophils Absolute 06/27/2016 0.0  0 - 0.7 K/uL Final  . Basophils Relative 06/27/2016 1  % Final  . Basophils Absolute 06/27/2016 0.0  0 - 0.1 K/uL Final  . Sodium 06/27/2016 140  135 - 145 mmol/L Final  . Potassium 06/27/2016 4.6  3.5 - 5.1 mmol/L Final  . Chloride 06/27/2016 109  101 - 111 mmol/L Final  . CO2 06/27/2016 25  22 - 32 mmol/L Final  . Glucose, Bld 06/27/2016 114* 65 - 99 mg/dL Final  . BUN 06/27/2016 22* 6 - 20 mg/dL Final  . Creatinine, Ser 06/27/2016 0.99  0.61 - 1.24 mg/dL Final  . Calcium 06/27/2016 8.8* 8.9 - 10.3 mg/dL Final  . Total Protein 06/27/2016 7.0  6.5 - 8.1 g/dL Final  . Albumin 06/27/2016 4.0  3.5 - 5.0 g/dL Final  . AST 06/27/2016 31  15 - 41 U/L Final  . ALT 06/27/2016 27  17 - 63 U/L Final  . Alkaline  Phosphatase 06/27/2016 102  38 - 126 U/L Final  . Total Bilirubin 06/27/2016 0.8  0.3 - 1.2 mg/dL Final  . GFR calc non Af Amer 06/27/2016 >60  >60 mL/min Final  . GFR calc Af Amer 06/27/2016 >60  >60 mL/min Final   Comment: (NOTE) The eGFR has been calculated using the CKD EPI equation. This calculation has not been validated in all clinical situations. eGFR's persistently <60 mL/min signify possible Chronic Kidney Disease.   . Anion gap 06/27/2016 6  5 - 15 Final  . LDH 06/27/2016 424* 98 - 192 U/L Final    Assessment:  [name] is a 76 y.o. 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  He presented with a long history of normocytic anemia unresponsive to oral iron.  He has mild leukopenia.  He has mild splenomegaly (15.8 cm) by abdominal ultrasound on 06/04/2015.  DIPPS score is 58 (age 62- 1, hemoglobin less than 10- 2) and score of 4 if 1% circulating blasts included from 07/17/2014.  Bone marrow on 06/11/2014 was most consistent with primary myelofibrosis.  Bone marrow biopsy showed 1% abnormal cells: CD45+, CD5+, CD10, CD11c+/-, CD19+, CD2-+, (dim), CD22+ (dim, CD23+, CD38-/+, FMC7-, HLA-DR+, sig lambda+(dim).  Blasts were not increased 1.2%; hypercellular for age: 66%; JAK2 V617F mutation was negative.  CALR mutation positive.  Flow cytometry included about 1% CLL/SLL phenotype cells (WT8+) of uncertain significance and some infiltrate into the marrow with increased atypical megakaryocytes.  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  MDS FISH panel was negative.  Labs in 05/2014 included the following normal studies: direct [place], epo level, haptoglobin, ANA, hemoglobin electrophoresis, hepatitis B surface antigen, HIV testing, UPEP.  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  LDH was elevated (520). Hepatitis C by PCR was less than 2 IU/ml.   Symptomatically, he feels good. Exam reveals a palpable spleen  tip.  Hemoglobin  has decreased slightly.  Plan: 1.  Review interval labs. 2.  Labs today:  CBC with diff, CMP, LDH. 3.  Continue to alternate appointments here and at [facility]. 4.  RTC in 6 months for MD assessment and labs (CBC with diff, CMP, LDH).   Lequita Asal, MD  06/27/2016, 3:43 PM

## 2016-06-28 DIAGNOSIS — Z961 Presence of intraocular lens: Secondary | ICD-10-CM | POA: Diagnosis not present

## 2016-07-04 ENCOUNTER — Telehealth: Payer: Self-pay | Admitting: Family Medicine

## 2016-07-04 MED ORDER — LOSARTAN POTASSIUM 100 MG PO TABS: 100.0000 mg | ORAL_TABLET | Freq: Every day | ORAL | 3 refills | Status: DC

## 2016-07-04 NOTE — Telephone Encounter (Signed)
Note from insurance that benicar is not going to be covered any more. Please let him know that I've changed him to another medicine and we'll check to make sure it's working as well at his follow up in March. If he starts feeling funny on the new medicine, he should give me a call.

## 2016-07-04 NOTE — Telephone Encounter (Signed)
Called and left patient a VM letting him know what Dr. Wynetta Emery said. DPR stated we could leave detailed message.

## 2016-07-12 ENCOUNTER — Other Ambulatory Visit: Payer: Self-pay | Admitting: Family Medicine

## 2016-08-15 ENCOUNTER — Ambulatory Visit: Payer: Medicare Other | Admitting: Family Medicine

## 2016-08-15 DIAGNOSIS — I959 Hypotension, unspecified: Secondary | ICD-10-CM | POA: Insufficient documentation

## 2016-08-15 DIAGNOSIS — E782 Mixed hyperlipidemia: Secondary | ICD-10-CM | POA: Insufficient documentation

## 2016-08-15 DIAGNOSIS — I951 Orthostatic hypotension: Secondary | ICD-10-CM | POA: Insufficient documentation

## 2016-08-15 DIAGNOSIS — I10 Essential (primary) hypertension: Secondary | ICD-10-CM | POA: Insufficient documentation

## 2016-08-15 NOTE — Progress Notes (Signed)
Cardiology Office Note  Date:  08/16/2016   ID:  [name], DOB [birth year], MRN [id number]  PCP:  Park Liter, DO   Chief Complaint  Patient presents with  . other    6 month follow up. Meds reviewed by the pt. verbally. "doing well."     HPI:  Case [name] a 76 y.o.malewith myelofibrosis diagnosed 06/11/2014.  long history of normocytic anemia unresponsive to oral iron. Had a CVA 3 to 4 years ago 6/09, received TPA, maintained on low-dose aspirin Carotids normal,Mild intracranial atherosclerotic type change on MRI 2009 HTN,  Chronic neck pain, multilevel disc osteophyte protrusions with bilateral foraminal narrowing right more than left mainly at C4-5 and C5-6. Previous history of hypotension, lightheadedness  Previous episodes of hypotension and lightheadedness in the setting of low blood pressure, orthostasis, dehydration particularly in the hot weather Blood pressure medication changes made, encouraged hydration, symptoms improved  Reports having chronic Left knee pain, previous cortisone shot Last visit benicar dose decreased Later changed to losartan one a day. 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  PSH:    Past Surgical History:  Procedure Laterality Date  . ASPIRATION / INJECTION RENAL CYST  Feb 2017  . BACK SURGERY     approx 20- 25 years ago  . GAS INSERTION  08/11/2011   Procedure: INSERTION OF GAS;  Surgeon: Hayden Pedro, MD;  Location: [place];  Service: Ophthalmology;  Laterality: Right;  C3F8  . SCLERAL BUCKLE  08/11/2011   Procedure: SCLERAL BUCKLE;  Surgeon: Hayden Pedro, MD;  Location: [place];  Service: Ophthalmology;  Laterality: Right;  Marland Kitchen VARICOSE VEIN SURGERY      Current Outpatient Prescriptions  Medication Sig Dispense Refill  . atorvastatin (LIPITOR) 10 MG tablet Take 1 tablet (10 mg total) by mouth at bedtime. 90 tablet 1  . chlorpheniramine (CHLOR-TRIMETON) 4 MG tablet Take 4 mg by mouth 2 (two) times daily as needed for allergies.    . cholecalciferol (VITAMIN D) 1000 units tablet Take 1,000 Units by mouth daily.    . clopidogrel (PLAVIX) 75 MG tablet TAKE ONE TABLET EVERY DAY 90 tablet 1  . diclofenac sodium (VOLTAREN) 1 % GEL Apply 1 application topically 4 (four) times daily as needed (for neck pain). 100 g 6  . gabapentin (NEURONTIN) 300 MG capsule TAKE 1 CAPSULE BY MOUTH 3 TIMES DAILY 270 capsule 3  . Ginsengs-Saw Palmetto (MULTI GINSENG & SAW PALMETTO) 500 MG CAPS Take 500 mg by mouth daily.    Marland Kitchen losartan (COZAAR) 100 MG tablet Take 1 tablet (100 mg  total) by mouth daily. 30 tablet 3  . meclizine (ANTIVERT) 25 MG tablet TAKE ONE TABLET 3 TIMES DAILY AS NEEDED FOR DIZZINESS 90 tablet 1  . methocarbamol (ROBAXIN) 500 MG tablet Take 500 mg by mouth at bedtime.    . Multiple Vitamin (MULITIVITAMIN WITH MINERALS) TABS Take 1 tablet by mouth daily.    . naproxen sodium (ANAPROX) 220 MG tablet Take 440 mg by mouth 2 (two) times daily as needed (for pain).     Marland Kitchen omega-3 acid ethyl esters  (LOVAZA) 1 G capsule Take 1 g by mouth 2 (two) times daily.    . ranitidine (ZANTAC) 150 MG tablet Take 150 mg by mouth 2 (two) times daily.    . tamsulosin (FLOMAX) 0.4 MG CAPS capsule TAKE 1 CAPSULE BY MOUTH EVERY DAY 30 capsule 11   No current facility-administered medications for this visit.      Allergies:   Meloxicam   Social History:  The patient  reports that he quit smoking about 6 years ago. His smoking use included Cigarettes. He smoked 0.25 packs per day. He has never used smokeless tobacco. He reports that he does not drink alcohol or use drugs.   Family History:   family history includes Diabetes in his son; Heart disease in his father.    Review of Systems: Review of Systems  Constitutional: Negative.   Respiratory: Negative.   Cardiovascular: Negative.   Gastrointestinal: Negative.   Musculoskeletal: Positive for joint pain.  Neurological: Negative.   Psychiatric/Behavioral: Negative.   All other systems reviewed and are negative.    PHYSICAL EXAM: VS:  BP 138/76 (BP Location: Left Arm, Patient Position: Sitting, Cuff Size: Normal)   Pulse 73   Ht 5\' 10"  (1.778 m)   Wt 187 lb 8 oz (85 kg)   BMI 26.90 kg/m  , BMI Body mass index is 26.9 kg/m. GEN: Well nourished, well developed, in no acute distress  HEENT: normal  Neck: no JVD, carotid bruits, or masses Cardiac: RRR; no murmurs, rubs, or gallops,no edema  Respiratory:  clear to auscultation bilaterally, normal work of breathing GI: soft, nontender, nondistended, + BS MS: no deformity or atrophy  Skin: warm and dry, no rash Neuro:  Strength and sensation are intact Psych: euthymic mood, full affect    Recent Labs: 06/27/2016: ALT 27; BUN 22; Creatinine, Ser 0.99; Hemoglobin 9.3; Platelets 302; Potassium 4.6; Sodium 140    Lipid Panel Lab Results  Component Value Date   CHOL 106 04/14/2016   HDL 33 (L) 08/31/2015   LDLCALC 54 08/31/2015   TRIG 99 04/14/2016      Wt Readings from Last 3  Encounters:  08/16/16 187 lb 8 oz (85 kg)  06/27/16 191 lb 9.3 oz (86.9 kg)  05/16/16 195 lb 12.8 oz (88.8 kg)       ASSESSMENT AND PLAN:  Cerebrovascular accident (CVA), unspecified mechanism ([place]) - Plan: EKG 12-Lead Encouraged him to stay on low-dose aspirin, no recent events  Orthostatic hypotension - Plan: EKG 12-Lead History tolerating losartan daily with no orthostasis Denies having any symptoms at work  Postural lightheadedness - Plan: EKG 12-Lead Recommended he stay hydrated particularly in the hot weather Previous symptoms secondary to dehydration, overmedication  Mixed hyperlipidemia - Plan: EKG 12-Lead Cholesterol is at goal on the current lipid regimen. No changes to the medications were made.  Benign essential HTN - Plan: EKG 12-Lead Blood pressure is well controlled on today's visit. No changes made to the medications.   Total  encounter time more than 25 minutes  Greater than 50% was spent in counseling and coordination of care with the patient   Disposition:   F/U  12 months   Orders Placed This Encounter  Procedures  . EKG 12-Lead     Signed, Esmond Plants, M.D., Ph.D. 08/16/2016  [place], [place]

## 2016-08-16 ENCOUNTER — Ambulatory Visit (INDEPENDENT_AMBULATORY_CARE_PROVIDER_SITE_OTHER): Payer: Medicare Other | Admitting: Cardiovascular Disease

## 2016-08-16 ENCOUNTER — Encounter: Payer: Self-pay | Admitting: Cardiovascular Disease

## 2016-08-16 VITALS — BP 138/76 | HR 73 | Ht 70.0 in | Wt 187.5 lb

## 2016-08-16 DIAGNOSIS — I639 Cerebral infarction, unspecified: Secondary | ICD-10-CM

## 2016-08-16 DIAGNOSIS — R42 Dizziness and giddiness: Secondary | ICD-10-CM

## 2016-08-16 DIAGNOSIS — E782 Mixed hyperlipidemia: Secondary | ICD-10-CM | POA: Diagnosis not present

## 2016-08-16 DIAGNOSIS — I10 Essential (primary) hypertension: Secondary | ICD-10-CM

## 2016-08-16 DIAGNOSIS — I951 Orthostatic hypotension: Secondary | ICD-10-CM

## 2016-08-16 NOTE — Patient Instructions (Addendum)
Medication Instructions:   Ok to stop lovaza (omega 3 acid/fish oil)   Labwork:  No new labs needed  Testing/Procedures:  No further testing at this time   I recommend watching educational videos on topics of interest to you at:       www.goemmi.com  Enter code: [email or web address]    Follow-Up: It was a pleasure seeing you in the office today. Please call us if you have new issues that need to be addressed before your next appt.  [phone]  Your physician wants you to follow-up in: 12 months.  You will receive a reminder letter in the mail two months in advance. If you don't receive a letter, please call our office to schedule the follow-up appointment.  If you need a refill on your cardiac medications before your next appointment, please call your pharmacy.

## 2016-10-04 ENCOUNTER — Other Ambulatory Visit: Payer: Self-pay | Admitting: Family Medicine

## 2016-10-07 ENCOUNTER — Ambulatory Visit (INDEPENDENT_AMBULATORY_CARE_PROVIDER_SITE_OTHER): Payer: Medicare Other | Admitting: Family Medicine

## 2016-10-07 ENCOUNTER — Encounter: Payer: Self-pay | Admitting: Family Medicine

## 2016-10-07 VITALS — BP 122/68 | HR 63 | Wt 189.5 lb

## 2016-10-07 DIAGNOSIS — S41102A Unspecified open wound of left upper arm, initial encounter: Secondary | ICD-10-CM

## 2016-10-07 DIAGNOSIS — Z87438 Personal history of other diseases of male genital organs: Secondary | ICD-10-CM | POA: Diagnosis not present

## 2016-10-07 DIAGNOSIS — D649 Anemia, unspecified: Secondary | ICD-10-CM

## 2016-10-07 DIAGNOSIS — E782 Mixed hyperlipidemia: Secondary | ICD-10-CM

## 2016-10-07 DIAGNOSIS — Z23 Encounter for immunization: Secondary | ICD-10-CM | POA: Diagnosis not present

## 2016-10-07 DIAGNOSIS — D7581 Myelofibrosis: Secondary | ICD-10-CM | POA: Diagnosis not present

## 2016-10-07 DIAGNOSIS — I129 Hypertensive chronic kidney disease with stage 1 through stage 4 chronic kidney disease, or unspecified chronic kidney disease: Secondary | ICD-10-CM | POA: Diagnosis not present

## 2016-10-07 DIAGNOSIS — D709 Neutropenia, unspecified: Secondary | ICD-10-CM

## 2016-10-07 DIAGNOSIS — D474 Osteomyelofibrosis: Secondary | ICD-10-CM

## 2016-10-07 DIAGNOSIS — R3911 Hesitancy of micturition: Secondary | ICD-10-CM | POA: Diagnosis not present

## 2016-10-07 DIAGNOSIS — E875 Hyperkalemia: Secondary | ICD-10-CM | POA: Diagnosis not present

## 2016-10-07 DIAGNOSIS — M1712 Unilateral primary osteoarthritis, left knee: Secondary | ICD-10-CM | POA: Diagnosis not present

## 2016-10-07 DIAGNOSIS — I639 Cerebral infarction, unspecified: Secondary | ICD-10-CM

## 2016-10-07 MED ORDER — TAMSULOSIN HCL 0.4 MG PO CAPS: 0.4000 mg | ORAL_CAPSULE | Freq: Every day | ORAL | 3 refills | Status: DC

## 2016-10-07 MED ORDER — DICLOFENAC SODIUM 1 % TD GEL: 4.0000 g | Freq: Four times a day (QID) | TRANSDERMAL | 6 refills | Status: DC | PRN

## 2016-10-07 MED ORDER — LOSARTAN POTASSIUM 100 MG PO TABS: 100.0000 mg | ORAL_TABLET | Freq: Every day | ORAL | 1 refills | Status: DC

## 2016-10-07 MED ORDER — GABAPENTIN 300 MG PO CAPS: 300.0000 mg | ORAL_CAPSULE | Freq: Three times a day (TID) | ORAL | 3 refills | Status: DC

## 2016-10-07 MED ORDER — MECLIZINE HCL 25 MG PO TABS: ORAL_TABLET | ORAL | 1 refills | Status: DC

## 2016-10-07 MED ORDER — ATORVASTATIN CALCIUM 10 MG PO TABS: 10.0000 mg | ORAL_TABLET | Freq: Every day | ORAL | 1 refills | Status: DC

## 2016-10-07 MED ORDER — CLOPIDOGREL BISULFATE 75 MG PO TABS: 75.0000 mg | ORAL_TABLET | Freq: Every day | ORAL | 1 refills | Status: DC

## 2016-10-07 NOTE — Assessment & Plan Note (Signed)
Will refer back to orthopedics. Call with any concerns.

## 2016-10-07 NOTE — Assessment & Plan Note (Signed)
Continue to follow with hematology. Rechecking CBC today. Currently stable.

## 2016-10-07 NOTE — Assessment & Plan Note (Signed)
Rechecking levels today. Await results.  

## 2016-10-07 NOTE — Assessment & Plan Note (Signed)
Rechecking CBC today. Await results.  

## 2016-10-07 NOTE — Assessment & Plan Note (Signed)
Checking PSA today. Await results.  

## 2016-10-07 NOTE — Progress Notes (Signed)
BP 122/68 (BP Location: Left Arm, Patient Position: Sitting, Cuff Size: Normal)   Pulse 63   Wt 189 lb 8 oz (86 kg)   SpO2 97%   BMI 27.19 kg/m    Subjective:    Patient ID: [name], male    DOB: 1940-09-09, 76 y.o.   MRN: [id number]  HPI: [name] is a 76 y.o. male  Chief Complaint  Patient presents with  . Hypertension   HYPERTENSION / [place] Satisfied with current treatment? yes Duration of hypertension: chronic BP monitoring frequency: not checking BP medication side effects: no Past BP meds: losartan Duration of hyperlipidemia: chronic Cholesterol medication side effects: no Cholesterol supplements: none Past cholesterol medications: atorvastatin Medication compliance: excellent compliance Aspirin: no Recent stressors: no Recurrent headaches: no Visual changes: no Palpitations: no Dyspnea: no Chest pain: no Lower extremity edema: no Dizzy/lightheaded: no  He notes that his L knee has been hurting. The pain comes and goes. He had a shot in it about a year ago at orthopedics and that really helped, but he hasn't seen them since.   Relevant past medical, surgical, family and social history reviewed and updated as indicated. Interim medical history since our last visit reviewed. Allergies and medications reviewed and updated.  Review of Systems  Constitutional: Negative.   Respiratory: Negative.   Cardiovascular: Negative.   Psychiatric/Behavioral: Negative.     Per HPI unless specifically indicated above     Objective:    BP 122/68 (BP Location: Left Arm, Patient Position: Sitting, Cuff Size: Normal)   Pulse 63   Wt 189 lb 8 oz (86 kg)   SpO2 97%   BMI 27.19 kg/m   Wt Readings from Last 3 Encounters:  10/07/16 189 lb 8 oz (86 kg)  08/16/16 187 lb 8 oz (85 kg)  06/27/16 191 lb 9.3 oz (86.9 kg)    Physical Exam  Constitutional: He is oriented to person, place, and time. He appears well-developed and well-nourished. No distress.    HENT:  Head: Normocephalic and atraumatic.  Right Ear: Hearing normal.  Left Ear: Hearing normal.  Nose: Nose normal.  Eyes: Conjunctivae and lids are normal. Right eye exhibits no discharge. Left eye exhibits no discharge. No scleral icterus.  Cardiovascular: Normal rate, regular rhythm, normal heart sounds and intact distal pulses.  Exam reveals no gallop and no friction rub.   No murmur heard. Pulmonary/Chest: Effort normal and breath sounds normal. No respiratory distress. He has no wheezes. He has no rales. He exhibits no tenderness.  Musculoskeletal: He exhibits tenderness. He exhibits no edema or deformity.  Neurological: He is alert and oriented to person, place, and time.  Skin: Skin is warm, dry and intact. No rash noted. He is not diaphoretic. No erythema. No pallor.  Small well healing wound on L forearm  Psychiatric: He has a normal mood and affect. His speech is normal and behavior is normal. Judgment and thought content normal. Cognition and memory are normal.  Nursing note and vitals reviewed.   Results for orders placed or performed in visit on 06/27/16  CBC with Differential  Result Value Ref Range   WBC 3.9 3.8 - 10.6 K/uL   RBC 3.08 (L) 4.40 - 5.90 MIL/uL   Hemoglobin 9.3 (L) 13.0 - 18.0 g/dL   HCT 27.8 (L) 40.0 - 52.0 %   MCV 90.0 80.0 - 100.0 fL   MCH 30.1 26.0 - 34.0 pg   MCHC 33.4 32.0 - 36.0 g/dL   RDW  18.5 (H) 11.5 - 14.5 %   Platelets 302 150 - 440 K/uL   Neutrophils Relative % 71 %   Neutro Abs 2.8 1.4 - 6.5 K/uL   Lymphocytes Relative 18 %   Lymphs Abs 0.7 (L) 1.0 - 3.6 K/uL   Monocytes Relative 9 %   Monocytes Absolute 0.4 0.2 - 1.0 K/uL   Eosinophils Relative 1 %   Eosinophils Absolute 0.0 0 - 0.7 K/uL   Basophils Relative 1 %   Basophils Absolute 0.0 0 - 0.1 K/uL  Comprehensive metabolic panel  Result Value Ref Range   Sodium 140 135 - 145 mmol/L   Potassium 4.6 3.5 - 5.1 mmol/L   Chloride 109 101 - 111 mmol/L   CO2 25 22 - 32 mmol/L    Glucose, Bld 114 (H) 65 - 99 mg/dL   BUN 22 (H) 6 - 20 mg/dL   Creatinine, Ser 0.99 0.61 - 1.24 mg/dL   Calcium 8.8 (L) 8.9 - 10.3 mg/dL   Total Protein 7.0 6.5 - 8.1 g/dL   Albumin 4.0 3.5 - 5.0 g/dL   AST 31 15 - 41 U/L   ALT 27 17 - 63 U/L   Alkaline Phosphatase 102 38 - 126 U/L   Total Bilirubin 0.8 0.3 - 1.2 mg/dL   GFR calc non Af Amer >60 >60 mL/min   GFR calc Af Amer >60 >60 mL/min   Anion gap 6 5 - 15  Lactate dehydrogenase  Result Value Ref Range   LDH 424 (H) 98 - 192 U/L      Assessment & Plan:   Problem List Items Addressed This Visit      Musculoskeletal and Integument   Primary osteoarthritis of left knee    Will refer back to orthopedics. Call with any concerns.       Relevant Orders   Ambulatory referral to Orthopedic Surgery     Genitourinary   Benign hypertensive renal disease - Primary    Under good control today. Continue current regimen. Continue to monitor. Call with any concerns.       Relevant Orders   CBC with Differential/Platelet   Microalbumin, Urine Waived   TSH   UA/M w/rflx Culture, Routine   Comprehensive metabolic panel     Other   Myelofibrosis ([place])    Continue to follow with hematology. Call with any concerns.       History of BPH    Checking PSA today. Await results.       Relevant Orders   CBC with Differential/Platelet   TSH   UA/M w/rflx Culture, Routine   PSA   Comprehensive metabolic panel   Hyperkalemia    Rechecking levels today. Await results.       Relevant Orders   CBC with Differential/Platelet   TSH   UA/M w/rflx Culture, Routine   Comprehensive metabolic panel   Absolute anemia    Rechecking CBC today. Await results.       Relevant Orders   CBC with Differential/Platelet   TSH   UA/M w/rflx Culture, Routine   Comprehensive metabolic panel   Neutropenia ([place])    Continue to follow with hematology. Rechecking CBC today. Currently stable.       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 Rechecking CBC today. Currently stable.  Relevant Medications   meclizine (ANTIVERT) 25 MG tablet   Mixed hyperlipidemia    Well controlled on current regimen. Rechecking lipids today. Continue current regimen. Continue to monitor.       Relevant Medications   losartan (COZAAR) 100 MG tablet   atorvastatin (LIPITOR) 10 MG tablet   Other Relevant Orders   CBC with Differential/Platelet   Lipid Panel w/o Chol/HDL Ratio   TSH   UA/M w/rflx Culture, Routine   Comprehensive metabolic panel    Other Visit Diagnoses    Urinary hesitancy       Will check PSA today. Await results.    Relevant Orders   PSA   Immunization due       Will give Td on Tuesday as patient's wife needs to go to the hospital   Relevant Orders   Td : Tetanus/diphtheria >7yo Preservative  free   Open wound of left upper extremity, initial encounter       Healing well. Will give Td on Tuesday as patient's wife needs to go to the hospital   Relevant Orders   Td : Tetanus/diphtheria >7yo Preservative  free       Follow up plan: Return in about 6 months (around 04/09/2017) for Wellness please.

## 2016-10-07 NOTE — Assessment & Plan Note (Signed)
Well controlled on current regimen. Rechecking lipids today. Continue current regimen. Continue to monitor.

## 2016-10-07 NOTE — Assessment & Plan Note (Signed)
Continue to follow with hematology. Call with any concerns.  

## 2016-10-07 NOTE — Assessment & Plan Note (Signed)
Under good control today. Continue current regimen. Continue to monitor. Call with any concerns.  

## 2016-10-08 LAB — CBC WITH DIFFERENTIAL/PLATELET
Basophils Absolute: 0 10*3/uL (ref 0.0–0.2)
Basos: 0 %
EOS (ABSOLUTE): 0 10*3/uL (ref 0.0–0.4)
Eos: 0 %
Hematocrit: 28.8 % — ABNORMAL LOW (ref 37.5–51.0)
Hemoglobin: 9.6 g/dL — ABNORMAL LOW (ref 13.0–17.7)
Immature Grans (Abs): 0.1 10*3/uL (ref 0.0–0.1)
Immature Granulocytes: 3 %
Lymphocytes Absolute: 0.8 10*3/uL (ref 0.7–3.1)
Lymphs: 33 %
MCH: 30.2 pg (ref 26.6–33.0)
MCHC: 33.3 g/dL (ref 31.5–35.7)
MCV: 91 fL (ref 79–97)
Monocytes Absolute: 0.4 10*3/uL (ref 0.1–0.9)
Monocytes: 15 %
Neutrophils Absolute: 1.2 10*3/uL — ABNORMAL LOW (ref 1.4–7.0)
Neutrophils: 49 %
Platelets: 255 10*3/uL (ref 150–379)
RBC: 3.18 x10E6/uL — ABNORMAL LOW (ref 4.14–5.80)
RDW: 17.9 % — ABNORMAL HIGH (ref 12.3–15.4)
WBC: 2.4 10*3/uL — CL (ref 3.4–10.8)

## 2016-10-08 LAB — COMPREHENSIVE METABOLIC PANEL
ALT: 24 IU/L (ref 0–44)
AST: 25 IU/L (ref 0–40)
Albumin/Globulin Ratio: 2.2 (ref 1.2–2.2)
Albumin: 4.1 g/dL (ref 3.5–4.8)
Alkaline Phosphatase: 107 IU/L (ref 39–117)
BUN/Creatinine Ratio: 19 (ref 10–24)
BUN: 22 mg/dL (ref 8–27)
Bilirubin Total: 0.8 mg/dL (ref 0.0–1.2)
CO2: 25 mmol/L (ref 18–29)
Calcium: 8.8 mg/dL (ref 8.6–10.2)
Chloride: 105 mmol/L (ref 96–106)
Creatinine, Ser: 1.13 mg/dL (ref 0.76–1.27)
GFR calc Af Amer: 73 mL/min/{1.73_m2} (ref >59)
GFR calc non Af Amer: 63 mL/min/{1.73_m2} (ref >59)
Globulin, Total: 1.9 g/dL (ref 1.5–4.5)
Glucose: 96 mg/dL (ref 65–99)
Potassium: 5 mmol/L (ref 3.5–5.2)
Sodium: 144 mmol/L (ref 134–144)
Total Protein: 6 g/dL (ref 6.0–8.5)

## 2016-10-08 LAB — MICROALBUMIN, URINE WAIVED
Creatinine, Urine Waived: 200 mg/dL (ref 10–300)
Microalb, Ur Waived: 150 mg/L — ABNORMAL HIGH (ref 0–19)

## 2016-10-08 LAB — UA/M W/RFLX CULTURE, ROUTINE
Bilirubin, UA: NEGATIVE
Glucose, UA: NEGATIVE
Ketones, UA: NEGATIVE
Leukocytes, UA: NEGATIVE
Nitrite, UA: NEGATIVE
RBC, UA: NEGATIVE
Specific Gravity, UA: 1.02 (ref 1.005–1.030)
Urobilinogen, Ur: 1 mg/dL (ref 0.2–1.0)
pH, UA: 6.5 (ref 5.0–7.5)

## 2016-10-08 LAB — LIPID PANEL W/O CHOL/HDL RATIO
Cholesterol, Total: 99 mg/dL — ABNORMAL LOW (ref 100–199)
HDL: 33 mg/dL — ABNORMAL LOW (ref >39)
LDL Calculated: 46 mg/dL (ref 0–99)
Triglycerides: 101 mg/dL (ref 0–149)
VLDL Cholesterol Cal: 20 mg/dL (ref 5–40)

## 2016-10-08 LAB — MICROSCOPIC EXAMINATION

## 2016-10-08 LAB — TSH: TSH: 2.71 u[IU]/mL (ref 0.450–4.500)

## 2016-10-08 LAB — PSA: Prostate Specific Ag, Serum: 1.5 ng/mL (ref 0.0–4.0)

## 2016-10-10 ENCOUNTER — Encounter: Payer: Self-pay | Admitting: Family Medicine

## 2016-10-10 DIAGNOSIS — D630 Anemia in neoplastic disease: Secondary | ICD-10-CM | POA: Diagnosis not present

## 2016-10-10 DIAGNOSIS — D471 Chronic myeloproliferative disease: Secondary | ICD-10-CM | POA: Diagnosis not present

## 2016-10-10 DIAGNOSIS — R162 Hepatomegaly with splenomegaly, not elsewhere classified: Secondary | ICD-10-CM | POA: Diagnosis not present

## 2016-10-11 ENCOUNTER — Ambulatory Visit (INDEPENDENT_AMBULATORY_CARE_PROVIDER_SITE_OTHER): Payer: Medicare Other

## 2016-10-11 DIAGNOSIS — S41102A Unspecified open wound of left upper arm, initial encounter: Secondary | ICD-10-CM | POA: Diagnosis not present

## 2016-10-11 DIAGNOSIS — Z23 Encounter for immunization: Secondary | ICD-10-CM

## 2016-11-03 ENCOUNTER — Ambulatory Visit
Admission: RE | Admit: 2016-11-03 | Discharge: 2016-11-03 | Disposition: A | Discharge status: Home / Self Care | Payer: Medicare Other | Source: Ambulatory Visit | Attending: Urology | Admitting: Urology

## 2016-11-03 DIAGNOSIS — N281 Cyst of kidney, acquired: Secondary | ICD-10-CM

## 2016-11-03 DIAGNOSIS — Q6102 Congenital multiple renal cysts: Secondary | ICD-10-CM | POA: Diagnosis not present

## 2016-11-07 DIAGNOSIS — N281 Cyst of kidney, acquired: Secondary | ICD-10-CM | POA: Insufficient documentation

## 2016-11-07 NOTE — Progress Notes (Signed)
11/08/2016 11:11 AM   [name] 1941/04/20 [id number]  Referring provider: Arnetha Courser, MD [address] [place], [place] [address]  CC: Follow up large renal cysts  HPI:  1. Large Bilateral Renal Cysts - Rt 17cm lower pole cyst with few this septations by CT 2017 on eval RLQ mass. No enhancig nodules or overtly worrisome features. B2F. Several bilateral smaller cysts as well. NO hydro.  Recent Surveillance: 11/2016 - Renal US - RLP 11cm cysts (smaller), stable bilateral smaller additional, no hydro.  Today "[name]" is seen in f/u above. Interval renal US reassuring as renal cysts somewhat smaller and w/o large nodules.    PMH: Past Medical History:  Diagnosis Date  . Arthritis   . Benign hypertensive renal disease   . Cancer (HCC)    hx of melanoma  . GERD (gastroesophageal reflux disease)   . Heartburn   . History of retinal detachment   . Hyperlipidemia   . Hypertension   . Myelofibrosis ([place])   . Prostate hypertrophy   . 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 ASPIRATION / INJECTION RENAL CYST  Feb 2017  . BACK SURGERY     approx 20- 25 years ago  . GAS INSERTION  08/11/2011   Procedure: INSERTION OF GAS;  Surgeon: Hayden Pedro, MD;  Location: [place];  Service: Ophthalmology;  Laterality: Right;  [id number]  . SCLERAL BUCKLE  08/11/2011   Procedure: SCLERAL BUCKLE;  Surgeon: Hayden Pedro, MD;  Location: [place];  Service: Ophthalmology;  Laterality: Right;  Marland Kitchen VARICOSE VEIN SURGERY      Home Medications:  Allergies as of 11/08/2016      Reactions   Meloxicam Nausea And Vomiting      Medication List       Accurate as of 11/07/16 11:11 AM. Always use your most recent med list.          atorvastatin 10 MG tablet Commonly known as:  LIPITOR Take 1 tablet (10 mg total) by mouth at bedtime.   chlorpheniramine 4 MG tablet Commonly known as:   CHLOR-TRIMETON Take 4 mg by mouth 2 (two) times daily as needed for allergies.   clopidogrel 75 MG tablet Commonly known as:  PLAVIX Take 1 tablet (75 mg total) by mouth daily.   diclofenac sodium 1 % Gel Commonly known as:  VOLTAREN Apply 4 g topically 4 (four) times daily as needed (for neck pain).   gabapentin 300 MG capsule Commonly known as:  NEURONTIN Take 1 capsule (300 mg total) by mouth 3 (three) times daily.   losartan 100 MG tablet Commonly known as:  COZAAR Take 1 tablet (100 mg total) by mouth daily.   meclizine 25 MG tablet Commonly known as:  ANTIVERT TAKE ONE TABLET 3 TIMES DAILY AS NEEDED FOR DIZZINESS   methocarbamol 500 MG tablet Commonly known as:  ROBAXIN Take 500 mg by mouth at bedtime.   MULTI GINSENG & SAW PALMETTO 500 MG Caps Take 500 mg by mouth daily.   multivitamin with minerals Tabs tablet Take 1 tablet by mouth daily.   naproxen sodium 220 MG tablet Commonly known as:  ANAPROX Take 440 mg by mouth 2 (two) times daily as needed (for pain).   ranitidine 150 MG tablet Commonly known as:  ZANTAC Take 150 mg by mouth 2 (two) times daily.   tamsulosin 0.4 MG Caps capsule  Commonly known as:  FLOMAX Take 1 capsule (0.4 mg total) by mouth daily.       Allergies:  Allergies  Allergen Reactions  . Meloxicam Nausea And Vomiting    Family History: Family History  Problem Relation Age of Onset  . Heart disease Father   . Diabetes Son   . Kidney disease Neg Hx   . Prostate cancer Neg Hx     Social History:  reports that he quit smoking about 6 years ago. His smoking use included Cigarettes. He smoked 0.25 packs per day. He has never used smokeless tobacco. He reports that he does not drink alcohol or use drugs.   Review of Systems  Gastrointestinal (upper)  : Negative for upper GI symptoms  Gastrointestinal (lower) : Negative for lower GI symptoms  Constitutional : Negative for symptoms  Skin: Negative for skin  symptoms  Eyes: Negative for eye symptoms  Ear/Nose/Throat : Negative for Ear/Nose/Throat symptoms  Hematologic/Lymphatic: Negative for Hematologic/Lymphatic symptoms  Cardiovascular : Negative for cardiovascular symptoms  Respiratory : Negative for respiratory symptoms  Endocrine: Negative for endocrine symptoms  Musculoskeletal: Negative for musculoskeletal symptoms  Neurological: Negative for neurological symptoms  Psychologic: Negative for psychiatric symptoms   Physical Exam: There were no vitals taken for this visit.  Constitutional:  Alert and oriented, No acute distress. HEENT: [place] AT, moist mucus membranes.  Trachea midline, no masses. Cardiovascular: No clubbing, cyanosis, or edema. Respiratory: Normal respiratory effort, no increased work of breathing. GI: Abdomen is soft, nontender, nondistended, no abdominal masses. NO CVAT.  GU: No CVA tenderness. Phallus straight. No palpable scrotal masses.  Skin: No rashes, bruises or suspicious lesions. Lymph: No cervical or inguinal adenopathy. Neurologic: Grossly intact, no focal deficits, moving all 4 extremities. Psychiatric: Normal mood and affect.  Laboratory Data: Lab Results  Component Value Date   WBC 2.4 (LL) 10/07/2016   HGB 9.3 (L) 06/27/2016   HCT 28.8 (L) 10/07/2016   MCV 91 10/07/2016   PLT 255 10/07/2016    Lab Results  Component Value Date   CREATININE 1.13 10/07/2016    No results found for: PSA  No results found for: TESTOSTERONE  Lab Results  Component Value Date   HGBA1C  11/21/2007    5.9 (NOTE)   The ADA recommends the following therapeutic goals for glycemic   control related to Hgb A1C measurement:   Goal of Therapy:   < 7.0% Hgb A1C   Action Suggested:  > 8.0% Hgb A1C   Ref:  Diabetes Care, 22, Suppl. 1, 1999    Urinalysis    Component Value Date/Time   COLORURINE YELLOW (A) 11/09/2015 1933   APPEARANCEUR Clear 10/07/2016 1610   LABSPEC 1.019 11/09/2015 1933    PHURINE 5.0 11/09/2015 1933   GLUCOSEU Negative 10/07/2016 1610   HGBUR NEGATIVE 11/09/2015 1933   BILIRUBINUR Negative 10/07/2016 1610   KETONESUR NEGATIVE 11/09/2015 1933   PROTEINUR 2+ (A) 10/07/2016 1610   PROTEINUR 30 (A) 11/09/2015 1933   NITRITE Negative 10/07/2016 1610   NITRITE NEGATIVE 11/09/2015 1933   LEUKOCYTESUR Negative 10/07/2016 1610    Pertinent Imaging: Renal US independantly reviewed as per HPI  Assessment & Plan:    1. Large Bilateral Renal Cysts - stable / improved. NO mass effect or large nodules. Rec continue observation with annual renal US for now. Should become symptomatic, would consider lap/robotic decortication. I doubt that will ever be required.  RTC 1 year with renal US.   No Follow-up on file.  Masiah Lewing,  [facility]bard Robinson, [place] [address] [place], [place] [address] [phone]

## 2016-11-08 ENCOUNTER — Ambulatory Visit (INDEPENDENT_AMBULATORY_CARE_PROVIDER_SITE_OTHER): Payer: Medicare Other | Admitting: Urology

## 2016-11-08 ENCOUNTER — Encounter: Payer: Self-pay | Admitting: Urology

## 2016-11-08 DIAGNOSIS — I639 Cerebral infarction, unspecified: Secondary | ICD-10-CM | POA: Diagnosis not present

## 2016-11-08 DIAGNOSIS — N281 Cyst of kidney, acquired: Secondary | ICD-10-CM | POA: Diagnosis not present

## 2016-11-08 NOTE — Addendum Note (Signed)
Addended by: Kyra Manges on: 11/08/2016 01:34 PM   Modules accepted: Orders

## 2016-11-09 ENCOUNTER — Telehealth: Payer: Self-pay

## 2016-11-09 DIAGNOSIS — R9389 Abnormal findings on diagnostic imaging of other specified body structures: Secondary | ICD-10-CM

## 2016-11-09 NOTE — Telephone Encounter (Signed)
Christian Aloe, MD  [place]        Notify patient the cysts in his kidneys are stable (he saw Dr. Tresa Moore for this yesterday), however another finding was noted on the [place]. There may be a mass in the liver. He needs a CT scan Abdomen of the abdomen with and without contrast done next available. Please order. I'll notify him of results.

## 2016-11-10 NOTE — Telephone Encounter (Signed)
Patient notified and will be contacted by scheduling to schedule CTscan

## 2016-11-10 NOTE — Telephone Encounter (Signed)
-----   Message from Festus Aloe, MD sent at 11/09/2016  1:23 PM EDT ----- Notify patient the cysts in his kidneys are stable (he saw Dr. Tresa Moore for this yesterday), however another finding was noted on the [place]. There may be a mass in the liver. He needs a CT scan Abdomen of the abdomen with and without contrast done next available. Please order. I'll notify him of results.   ----- Message ----- From: Leia Alf, CMA Sent: 11/04/2016   9:32 AM To: Festus Aloe, MD    ----- Message ----- From: Interface, Rad Results In Sent: 11/03/2016   5:04 PM To: Rowe [facility]

## 2016-11-14 ENCOUNTER — Ambulatory Visit
Admission: RE | Admit: 2016-11-14 | Discharge: 2016-11-14 | Disposition: A | Discharge status: Home / Self Care | Payer: Medicare Other | Source: Ambulatory Visit | Attending: Urology | Admitting: Urology

## 2016-11-14 DIAGNOSIS — R9389 Abnormal findings on diagnostic imaging of other specified body structures: Secondary | ICD-10-CM

## 2016-11-14 DIAGNOSIS — K7689 Other specified diseases of liver: Secondary | ICD-10-CM | POA: Diagnosis not present

## 2016-11-14 DIAGNOSIS — R938 Abnormal findings on diagnostic imaging of other specified body structures: Secondary | ICD-10-CM | POA: Insufficient documentation

## 2016-11-14 DIAGNOSIS — N200 Calculus of kidney: Secondary | ICD-10-CM | POA: Insufficient documentation

## 2016-11-14 DIAGNOSIS — I700 Atherosclerosis of aorta: Secondary | ICD-10-CM | POA: Diagnosis not present

## 2016-11-14 DIAGNOSIS — R161 Splenomegaly, not elsewhere classified: Secondary | ICD-10-CM | POA: Diagnosis not present

## 2016-11-14 DIAGNOSIS — N281 Cyst of kidney, acquired: Secondary | ICD-10-CM | POA: Insufficient documentation

## 2016-11-14 HISTORY — DX: Malignant melanoma of skin, unspecified: C43.9

## 2016-11-14 MED ORDER — IOPAMIDOL (ISOVUE-370) INJECTION 76%
100.0000 mL | Freq: Once | INTRAVENOUS | Status: AC | PRN
  Administered 2016-11-14: 100 mL via INTRAVENOUS

## 2016-11-17 ENCOUNTER — Telehealth: Payer: Self-pay

## 2016-11-17 NOTE — Telephone Encounter (Signed)
Christian Aloe, MD  Leia Alf, CMA        Notify patient his CT showed a small mass in the liver (1.8 cm) that was also seen on his Feb 2017 CT and looks about the same. I spoke with radiology and it is a small benign (non-cancerous) growth in the liver and nothing needs to be done about it.      Called patient. No answer. Will try later.

## 2016-11-21 NOTE — Telephone Encounter (Signed)
Patient called back, I read the results message from Dr.Easkridge to the patient. All questions answered.  Sharyn Lull

## 2016-12-26 ENCOUNTER — Inpatient Hospital Stay: Payer: Medicare Other

## 2016-12-26 ENCOUNTER — Ambulatory Visit: Payer: PRIVATE HEALTH INSURANCE | Admitting: Hematology and Oncology

## 2016-12-28 ENCOUNTER — Ambulatory Visit: Payer: PRIVATE HEALTH INSURANCE | Admitting: Hematology and Oncology

## 2016-12-28 ENCOUNTER — Encounter: Payer: Self-pay | Admitting: Hematology and Oncology

## 2016-12-28 ENCOUNTER — Other Ambulatory Visit: Payer: Medicare Other

## 2016-12-28 ENCOUNTER — Inpatient Hospital Stay: Payer: Medicare Other

## 2016-12-28 ENCOUNTER — Inpatient Hospital Stay: Payer: Medicare Other | Attending: Hematology and Oncology | Admitting: Hematology and Oncology

## 2016-12-28 VITALS — HR 67 | Temp 97.0°F | Resp 18 | Wt 188.5 lb

## 2016-12-28 DIAGNOSIS — K219 Gastro-esophageal reflux disease without esophagitis: Secondary | ICD-10-CM | POA: Insufficient documentation

## 2016-12-28 DIAGNOSIS — Z87891 Personal history of nicotine dependence: Secondary | ICD-10-CM | POA: Insufficient documentation

## 2016-12-28 DIAGNOSIS — D649 Anemia, unspecified: Secondary | ICD-10-CM | POA: Diagnosis not present

## 2016-12-28 DIAGNOSIS — Z79899 Other long term (current) drug therapy: Secondary | ICD-10-CM | POA: Diagnosis not present

## 2016-12-28 DIAGNOSIS — R162 Hepatomegaly with splenomegaly, not elsewhere classified: Secondary | ICD-10-CM

## 2016-12-28 DIAGNOSIS — D801 Nonfamilial hypogammaglobulinemia: Secondary | ICD-10-CM

## 2016-12-28 DIAGNOSIS — D471 Chronic myeloproliferative disease: Secondary | ICD-10-CM | POA: Diagnosis not present

## 2016-12-28 DIAGNOSIS — M1990 Unspecified osteoarthritis, unspecified site: Secondary | ICD-10-CM | POA: Diagnosis not present

## 2016-12-28 DIAGNOSIS — I129 Hypertensive chronic kidney disease with stage 1 through stage 4 chronic kidney disease, or unspecified chronic kidney disease: Secondary | ICD-10-CM | POA: Diagnosis not present

## 2016-12-28 DIAGNOSIS — D630 Anemia in neoplastic disease: Secondary | ICD-10-CM

## 2016-12-28 DIAGNOSIS — D7581 Myelofibrosis: Secondary | ICD-10-CM

## 2016-12-28 DIAGNOSIS — D72819 Decreased white blood cell count, unspecified: Secondary | ICD-10-CM | POA: Insufficient documentation

## 2016-12-28 DIAGNOSIS — N400 Enlarged prostate without lower urinary tract symptoms: Secondary | ICD-10-CM | POA: Diagnosis not present

## 2016-12-28 DIAGNOSIS — R740 Nonspecific elevation of levels of transaminase and lactic acid dehydrogenase [LDH]: Secondary | ICD-10-CM | POA: Diagnosis not present

## 2016-12-28 DIAGNOSIS — N189 Chronic kidney disease, unspecified: Secondary | ICD-10-CM | POA: Insufficient documentation

## 2016-12-28 DIAGNOSIS — E785 Hyperlipidemia, unspecified: Secondary | ICD-10-CM

## 2016-12-28 DIAGNOSIS — R161 Splenomegaly, not elsewhere classified: Secondary | ICD-10-CM | POA: Diagnosis not present

## 2016-12-28 DIAGNOSIS — Z85820 Personal history of malignant melanoma of skin: Secondary | ICD-10-CM

## 2016-12-28 DIAGNOSIS — Z8673 Personal history of transient ischemic attack (TIA), and cerebral infarction without residual deficits: Secondary | ICD-10-CM | POA: Insufficient documentation

## 2016-12-28 LAB — COMPREHENSIVE METABOLIC PANEL
ALT: 27 U/L (ref 17–63)
AST: 28 U/L (ref 15–41)
Albumin: 3.9 g/dL (ref 3.5–5.0)
Alkaline Phosphatase: 111 U/L (ref 38–126)
Anion gap: 7 (ref 5–15)
BUN: 22 mg/dL — ABNORMAL HIGH (ref 6–20)
CO2: 27 mmol/L (ref 22–32)
Calcium: 8.8 mg/dL — ABNORMAL LOW (ref 8.9–10.3)
Chloride: 104 mmol/L (ref 101–111)
Creatinine, Ser: 1.06 mg/dL (ref 0.61–1.24)
GFR calc Af Amer: >60 mL/min (ref >60)
GFR calc non Af Amer: >60 mL/min (ref >60)
Glucose, Bld: 136 mg/dL — ABNORMAL HIGH (ref 65–99)
Potassium: 4.3 mmol/L (ref 3.5–5.1)
Sodium: 138 mmol/L (ref 135–145)
Total Bilirubin: 0.9 mg/dL (ref 0.3–1.2)
Total Protein: 6.6 g/dL (ref 6.5–8.1)

## 2016-12-28 LAB — CBC WITH DIFFERENTIAL/PLATELET
Basophils Absolute: 0 10*3/uL (ref 0–0.1)
Basophils Relative: 0 %
Eosinophils Absolute: 0 10*3/uL (ref 0–0.7)
Eosinophils Relative: 0 %
HCT: 28.6 % — ABNORMAL LOW (ref 40.0–52.0)
Hemoglobin: 9.6 g/dL — ABNORMAL LOW (ref 13.0–18.0)
Lymphocytes Relative: 27 %
Lymphs Abs: 0.7 10*3/uL — ABNORMAL LOW (ref 1.0–3.6)
MCH: 30.5 pg (ref 26.0–34.0)
MCHC: 33.7 g/dL (ref 32.0–36.0)
MCV: 90.6 fL (ref 80.0–100.0)
Monocytes Absolute: 0.3 10*3/uL (ref 0.2–1.0)
Monocytes Relative: 11 %
Neutro Abs: 1.5 10*3/uL (ref 1.4–6.5)
Neutrophils Relative %: 62 %
Platelets: 236 10*3/uL (ref 150–440)
RBC: 3.16 MIL/uL — ABNORMAL LOW (ref 4.40–5.90)
RDW: 17.9 % — ABNORMAL HIGH (ref 11.5–14.5)
WBC: 2.5 10*3/uL — ABNORMAL LOW (ref 3.8–10.6)

## 2016-12-28 LAB — LACTATE DEHYDROGENASE: LDH: 387 U/L — ABNORMAL HIGH (ref 98–192)

## 2016-12-28 NOTE — Progress Notes (Signed)
Patient offers no complaints today. 

## 2016-12-28 NOTE — Progress Notes (Signed)
[facility] day:  12/28/16  Chief Complaint: [name] is a 76 y.o. male  with myelofibrosis who is seen for 6 month assessment.  HPI: The patient was last seen in the medical oncology clinic on 06/27/2016.  At that time, he felt good. Exam revealed a palpable spleen tip.  Hemoglobin had decreased slightly.  He saw Dr.Arcasoy at [facility] on 10/10/2016.   At that time he denied any complaints.  Exam was stable.  Labs included a hematocrit 31.6, hemoglobin 9.9, platelets 257,000 and white count 2800.  There were no increased blasts.  Surveillance continued.  During the interim, he has continued to do well.  He denies any complaints. He is working full time as a Animator.  He separately denies any fevers, sweats, weight loss, early satiety, bruising, bleeding or recurrent infections.   Past Medical History:  Diagnosis Date  . Arthritis   . Benign hypertensive renal disease   . GERD (gastroesophageal reflux disease)   . Heartburn   . History of retinal detachment   . Hyperlipidemia   . Hypertension   . Melanoma ([place])    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 approximately 10-15 years ago  . Myelofibrosis ([place])   . Prostate hypertrophy   . Stroke [facility]) June 2009   R brain subcortical infarct    Past Surgical History:  Procedure Laterality Date  . ASPIRATION / INJECTION RENAL CYST  Feb 2017  . BACK SURGERY     approx 20- 25 years ago  . GAS INSERTION  08/11/2011   Procedure: INSERTION OF GAS;  Surgeon: Hayden Pedro, MD;  Location: [place];  Service: Ophthalmology;  Laterality: Right;  [id number]  . SCLERAL BUCKLE  08/11/2011   Procedure: SCLERAL BUCKLE;  Surgeon: Hayden Pedro, MD;  Location: [place];  Service: Ophthalmology;  Laterality: Right;  Marland Kitchen VARICOSE VEIN SURGERY      Family History  Problem Relation Age of Onset  . Heart disease Father   . Diabetes Son   . Kidney disease Neg Hx   . Prostate cancer Neg Hx     Social History:   reports that he quit smoking about 6 years ago. His smoking use included Cigarettes. He smoked 0.25 packs per day. He has never used smokeless tobacco. He reports that he does not drink alcohol or use drugs.  He works full-time as a Animator. He lives in [place].  He is alone today.  Allergies:  Allergies  Allergen Reactions  . Meloxicam Nausea And Vomiting    Current Medications: Current Outpatient Prescriptions  Medication Sig Dispense Refill  . atorvastatin (LIPITOR) 10 MG tablet Take 1 tablet (10 mg total) by mouth at bedtime. 90 tablet 1  . chlorpheniramine (CHLOR-TRIMETON) 4 MG tablet Take 4 mg by mouth 2 (two) times daily as needed for allergies.    Marland Kitchen clopidogrel (PLAVIX) 75 MG tablet Take 1 tablet (75 mg total) by mouth daily. 90 tablet 1  . diclofenac sodium (VOLTAREN) 1 % GEL Apply 4 g topically 4 (four) times daily as needed (for neck pain). 100 g 6  . gabapentin (NEURONTIN) 300 MG capsule Take 1 capsule (300 mg total) by mouth 3 (three) times daily. 270 capsule 3  . Ginsengs-Saw Palmetto (MULTI GINSENG & SAW PALMETTO) 500 MG CAPS Take 500 mg by mouth daily.    Marland Kitchen losartan (COZAAR) 100 MG tablet Take 1 tablet (100 mg total) by mouth daily. 90 tablet 1  .  meclizine (ANTIVERT) 25 MG tablet TAKE ONE TABLET 3 TIMES DAILY AS NEEDED FOR DIZZINESS 90 tablet 1  . methocarbamol (ROBAXIN) 500 MG tablet Take 500 mg by mouth at bedtime.    . Multiple Vitamin (MULITIVITAMIN WITH MINERALS) TABS Take 1 tablet by mouth daily.    . naproxen sodium (ANAPROX) 220 MG tablet Take 440 mg by mouth 2 (two) times daily as needed (for pain).     . ranitidine (ZANTAC) 150 MG tablet Take 150 mg by mouth 2 (two) times daily.    . tamsulosin (FLOMAX) 0.4 MG CAPS capsule Take 1 capsule (0.4 mg total) by mouth daily. 90 capsule 3   No current facility-administered medications for this visit.     Review of Systems:  GENERAL:  Feels good.  Active.  No fevers or sweats.  Weight stable. PERFORMANCE  STATUS (ECOG):  0 HEENT:  No visual changes, runny nose, sore throat, mouth sores or tenderness. Lungs: No shortness of breath or cough.  No hemoptysis. Cardiac:  No chest pain, palpitations, orthopnea, or PND. GI:  No nausea, vomiting, diarrhea, constipation, melena or hematochezia. GU:  No urgency, frequency, dysuria, or hematuria. Musculoskeletal:  No back pain.  No joint pain.  No muscle tenderness. Extremities:  No pain or swelling. Skin:  No rashes or skin changes. Neuro:  No headache, numbness or weakness, balance or coordination issues. Endocrine:  No diabetes, thyroid issues, hot flashes or night sweats. Psych:  No mood changes, depression or anxiety. Pain:  No focal pain. Review of systems:  All other systems reviewed and found to be negative.   Physical Exam: Pulse 67, temperature (!) 97 F (36.1 C), resp. rate 18, weight 188 lb 7.9 oz (85.5 kg). GENERAL:  Well developed, well nourished, gentleman sitting comfortably in the exam room in no acute distress. MENTAL STATUS:  Alert and oriented to person, place and time. HEAD:  Short gray hair.  Normocephalic, atraumatic, face symmetric, no Cushingoid features. EYES:  Blue eyes.  Pupils equal round and reactive to light and accomodation.  No conjunctivitis or scleral icterus. ENT:  Oropharynx clear without lesion.  Tongue normal. Mucous membranes moist.  RESPIRATORY:  Clear to auscultation without rales, wheezes or rhonchi. CARDIOVASCULAR:  Regular rate and rhythm without murmur, rub or gallop. ABDOMEN:  Soft, non-tender, with active bowel sounds, and a palpable spleen tip (2 cm below costal margin).  No hepatomegaly.  No masses. SKIN:  No rashes, ulcers or lesions. EXTREMITIES: No edema, no skin discoloration or tenderness.  No palpable cords. LYMPH NODES: No palpable cervical, supraclavicular, axillary or inguinal adenopathy  NEUROLOGICAL: Unremarkable. PSYCH:  Appropriate.    Appointment on 12/28/2016  Component Date Value  Ref Range Status  . WBC 12/28/2016 2.5* 3.8 - 10.6 K/uL Final  . RBC 12/28/2016 3.16* 4.40 - 5.90 MIL/uL Final  . Hemoglobin 12/28/2016 9.6* 13.0 - 18.0 g/dL Final  . HCT 12/28/2016 28.6* 40.0 - 52.0 % Final  . MCV 12/28/2016 90.6  80.0 - 100.0 fL Final  . MCH 12/28/2016 30.5  26.0 - 34.0 pg Final  . MCHC 12/28/2016 33.7  32.0 - 36.0 g/dL Final  . RDW 12/28/2016 17.9* 11.5 - 14.5 % Final  . Platelets 12/28/2016 236  150 - 440 K/uL Final  . Neutrophils Relative % 12/28/2016 62  % Final  . Neutro Abs 12/28/2016 1.5  1.4 - 6.5 K/uL Final  . Lymphocytes Relative 12/28/2016 27  % Final  . Lymphs Abs 12/28/2016 0.7* 1.0 - 3.6 K/uL Final  .  Monocytes Relative 12/28/2016 11  % Final  . Monocytes Absolute 12/28/2016 0.3  0.2 - 1.0 K/uL Final  . Eosinophils Relative 12/28/2016 0  % Final  . Eosinophils Absolute 12/28/2016 0.0  0 - 0.7 K/uL Final  . Basophils Relative 12/28/2016 0  % Final  . 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  He presented with a long history of normocytic anemia unresponsive to oral iron.  He has mild leukopenia.  He has mild splenomegaly (15.8 cm) by abdominal ultrasound on 06/04/2015.  DIPPS score is 31 (age 20- 1, hemoglobin less than 10- 2) and score of 4 if 1% circulating blasts included from 07/17/2014.  Bone marrow on 06/11/2014 was most consistent with primary myelofibrosis.  Bone marrow biopsy showed 1% abnormal cells: CD45+, CD5+, CD10, CD11c+/-, CD19+, CD2-+, (dim), CD22+ (dim, CD23+, CD38-/+, FMC7-, HLA-DR+, sig lambda+(dim).  Blasts were not increased 1.2%; hypercellular for age: 84%; JAK2 V617F mutation was negative.  CALR mutation positive.  Flow cytometry included about 1% CLL/SLL phenotype cells (EL3+) of uncertain significance and some infiltrate into the marrow with increased atypical megakaryocytes.  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  MDS FISH panel was negative.  Labs in 05/2014 included the following normal studies: direct [place], epo level, haptoglobin, ANA, hemoglobin electrophoresis, hepatitis B surface antigen, HIV testing, UPEP.  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  LDH was elevated (520). Hepatitis C by PCR was less than 2 IU/ml.   Symptomatically, he is asymptomatic. Exam reveals a palpable spleen tip.  Counts have decreased slightly.  Plan: 1.  Review interval labs. 2.  Labs today:  CBC with diff, CMP, LDH. 3.  Review fever and neutropenia precautions.  Follow-up during the interim if any concerns. 4.  Continue to alternate appointments here and at [facility]. 5.  RTC in 6 months for MD assessment and labs (CBC with diff, CMP, LDH).   Lequita Asal, MD  12/28/2016, 1:49 PM

## 2016-12-29 ENCOUNTER — Telehealth: Payer: Self-pay | Admitting: Family Medicine

## 2016-12-29 NOTE — Telephone Encounter (Signed)
He should not be on that continuously unless his oncologist has put him on it continuously.

## 2016-12-29 NOTE — Telephone Encounter (Signed)
Pharmacy notified.

## 2016-12-29 NOTE — Telephone Encounter (Signed)
Routing to provider  

## 2016-12-29 NOTE — Telephone Encounter (Signed)
Arbie Cookey with [facility] pharmacy called regarding the patients meclizine, they are concerned if the patient should be on this continuously for any reason.  Thanks  Lyman

## 2017-01-11 ENCOUNTER — Other Ambulatory Visit: Payer: Self-pay | Admitting: Family Medicine

## 2017-01-18 ENCOUNTER — Ambulatory Visit (INDEPENDENT_AMBULATORY_CARE_PROVIDER_SITE_OTHER): Payer: Medicare Other

## 2017-01-18 VITALS — BP 121/68 | HR 61 | Temp 98.4°F | Resp 16 | Ht 69.0 in | Wt 190.3 lb

## 2017-01-18 DIAGNOSIS — Z0000 Encounter for general adult medical examination without abnormal findings: Secondary | ICD-10-CM

## 2017-01-18 NOTE — Patient Instructions (Signed)
[name] , Thank you for taking time to come for your Medicare Wellness Visit. I appreciate your ongoing commitment to your health goals. Please review the following plan we discussed and let me know if I can assist you in the future.   Screening recommendations/referrals: Colonoscopy: completed 06/06/2009 Recommended yearly ophthalmology/optometry visit for glaucoma screening and checkup Recommended yearly dental visit for hygiene and checkup  Vaccinations: Influenza vaccine: up to date, due 03/2017 Pneumococcal vaccine: up to date  Tdap vaccine: up to date Shingles vaccine: up to date   Advanced directives: Advance directive discussed with you today. I have provided a copy for you to complete at home and have notarized. Once this is complete please bring a copy in to our office so we can scan it into your chart.  Conditions/risks identified: none   Next appointment: Follow up in one year for your annual wellness exam.   Preventive Care 65 Years and Older, Male Preventive care refers to lifestyle choices and visits with your health care provider that can promote health and wellness. What does preventive care include?  A yearly physical exam. This is also called an annual well check.  Dental exams once or twice a year.  Routine eye exams. Ask your health care provider how often you should have your eyes checked.  Personal lifestyle choices, including:  Daily care of your teeth and gums.  Regular physical activity.  Eating a healthy diet.  Avoiding tobacco and drug use.  Limiting alcohol use.  Practicing safe sex.  Taking low doses of aspirin every day.  Taking vitamin and mineral supplements as recommended by your health care provider. What happens during an annual well check? The services and screenings done by your health care provider during your annual well check will depend on your age, overall health, lifestyle risk factors, and family history of  disease. Counseling  Your health care provider may ask you questions about your:  Alcohol use.  Tobacco use.  Drug use.  Emotional well-being.  Home and relationship well-being.  Sexual activity.  Eating habits.  History of falls.  Memory and ability to understand (cognition).  Work and work Statistician. Screening  You may have the following tests or measurements:  Height, weight, and BMI.  Blood pressure.  Lipid and cholesterol levels. These may be checked every 5 years, or more frequently if you are over 88 years old.  Skin check.  Lung cancer screening. You may have this screening every year starting at age 75 if you have a 30-pack-year history of smoking and currently smoke or have quit within the past 15 years.  Fecal occult blood test (FOBT) of the stool. You may have this test every year starting at age 27.  Flexible sigmoidoscopy or colonoscopy. You may have a sigmoidoscopy every 5 years or a colonoscopy every 10 years starting at age 21.  Prostate cancer screening. Recommendations will vary depending on your family history and other risks.  Hepatitis C blood test.  Hepatitis B blood test.  Sexually transmitted disease (STD) testing.  Diabetes screening. This is done by checking your blood sugar (glucose) after you have not eaten for a while (fasting). You may have this done every 1-3 years.  Abdominal aortic aneurysm (AAA) screening. You may need this if you are a current or former smoker.  Osteoporosis. You may be screened starting at age 78 if you are at high risk. Talk with your health care provider about your test results, treatment options, and if necessary,  the need for more tests. Vaccines  Your health care provider may recommend certain vaccines, such as:  Influenza vaccine. This is recommended every year.  Tetanus, diphtheria, and acellular pertussis (Tdap, Td) vaccine. You may need a Td booster every 10 years.  Zoster vaccine. You may  need this after age 90.  Pneumococcal 13-valent conjugate (PCV13) vaccine. One dose is recommended after age 66.  Pneumococcal polysaccharide (PPSV23) vaccine. One dose is recommended after age 12. Talk to your health care provider about which screenings and vaccines you need and how often you need them. This information is not intended to replace advice given to you by your health care provider. Make sure you discuss any questions you have with your health care provider. Document Released: 06/19/2015 Document Revised: 02/10/2016 Document Reviewed: 03/24/2015 Elsevier Interactive Patient Education  2017 [place] Prevention in the Home Falls can cause injuries. They can happen to people of all ages. There are many things you can do to make your home safe and to help prevent falls. What can I do on the outside of my home?  Regularly fix the edges of walkways and driveways and fix any cracks.  Remove anything that might make you trip as you walk through a door, such as a raised step or threshold.  Trim any bushes or trees on the path to your home.  Use bright outdoor lighting.  Clear any walking paths of anything that might make someone trip, such as rocks or tools.  Regularly check to see if handrails are loose or broken. Make sure that both sides of any steps have handrails.  Any raised decks and porches should have guardrails on the edges.  Have any leaves, snow, or ice cleared regularly.  Use sand or salt on walking paths during winter.  Clean up any spills in your garage right away. This includes oil or grease spills. What can I do in the bathroom?  Use night lights.  Install grab bars by the toilet and in the tub and shower. Do not use towel bars as grab bars.  Use non-skid mats or decals in the tub or shower.  If you need to sit down in the shower, use a plastic, non-slip stool.  Keep the floor dry. Clean up any water that spills on the floor as soon as it  happens.  Remove soap buildup in the tub or shower regularly.  Attach bath mats securely with double-sided non-slip rug tape.  Do not have throw rugs and other things on the floor that can make you trip. What can I do in the bedroom?  Use night lights.  Make sure that you have a light by your bed that is easy to reach.  Do not use any sheets or blankets that are too big for your bed. They should not hang down onto the floor.  Have a firm chair that has side arms. You can use this for support while you get dressed.  Do not have throw rugs and other things on the floor that can make you trip. What can I do in the kitchen?  Clean up any spills right away.  Avoid walking on wet floors.  Keep items that you use a lot in easy-to-reach places.  If you need to reach something above you, use a strong step stool that has a grab bar.  Keep electrical cords out of the way.  Do not use floor polish or wax that makes floors slippery. If you must use  wax, use non-skid floor wax.  Do not have throw rugs and other things on the floor that can make you trip. What can I do with my stairs?  Do not leave any items on the stairs.  Make sure that there are handrails on both sides of the stairs and use them. Fix handrails that are broken or loose. Make sure that handrails are as long as the stairways.  Check any carpeting to make sure that it is firmly attached to the stairs. Fix any carpet that is loose or worn.  Avoid having throw rugs at the top or bottom of the stairs. If you do have throw rugs, attach them to the floor with carpet tape.  Make sure that you have a light switch at the top of the stairs and the bottom of the stairs. If you do not have them, ask someone to add them for you. What else can I do to help prevent falls?  Wear shoes that:  Do not have high heels.  Have rubber bottoms.  Are comfortable and fit you well.  Are closed at the toe. Do not wear sandals.  If you  use a stepladder:  Make sure that it is fully opened. Do not climb a closed stepladder.  Make sure that both sides of the stepladder are locked into place.  Ask someone to hold it for you, if possible.  Clearly mark and make sure that you can see:  Any grab bars or handrails.  First and last steps.  Where the edge of each step is.  Use tools that help you move around (mobility aids) if they are needed. These include:  Canes.  Walkers.  Scooters.  Crutches.  Turn on the lights when you go into a dark area. Replace any light bulbs as soon as they burn out.  Set up your furniture so you have a clear path. Avoid moving your furniture around.  If any of your floors are uneven, fix them.  If there are any pets around you, be aware of where they are.  Review your medicines with your doctor. Some medicines can make you feel dizzy. This can increase your chance of falling. Ask your doctor what other things that you can do to help prevent falls. This information is not intended to replace advice given to you by your health care provider. Make sure you discuss any questions you have with your health care provider. Document Released: 03/19/2009 Document Revised: 10/29/2015 Document Reviewed: 06/27/2014 Elsevier Interactive Patient Education  2017 [employer].

## 2017-01-18 NOTE — Progress Notes (Signed)
Subjective:   [name] is a 76 y.o. male who presents for Medicare Annual/Subsequent preventive examination.  Review of Systems:  Cardiac Risk Factors include: advanced age (>24men, >30 women);dyslipidemia;hypertension;male gender     Objective:    Vitals: BP 121/68 (BP Location: Left Arm, Patient Position: Sitting)   Pulse 61   Temp 98.4 F (36.9 C)   Resp 16   Ht 5\' 9"  (1.753 m)   Wt 190 lb 4.8 oz (86.3 kg)   BMI 28.10 kg/m   Body mass index is 28.1 kg/m.  Tobacco History  Smoking Status  . Former Smoker  . Packs/day: 0.25  . Types: Cigarettes  . Quit date: 06/06/2010  Smokeless Tobacco  . Never Used    Comment: quit 20 years     Counseling given: Not Answered   Past Medical History:  Diagnosis Date  . Arthritis   . Benign hypertensive renal disease   . GERD (gastroesophageal reflux disease)   . Heartburn   . History of retinal detachment   . Hyperlipidemia   . Hypertension   . Melanoma ([place])    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 approximately 10-15 years ago  . Myelofibrosis ([place])   . Prostate hypertrophy   . Stroke [facility]) June 2009   R brain subcortical infarct   Past Surgical History:  Procedure Laterality Date  . ASPIRATION / INJECTION RENAL CYST  Feb 2017  . BACK SURGERY     approx 20- 25 years ago  . GAS INSERTION  08/11/2011   Procedure: INSERTION OF GAS;  Surgeon: Hayden Pedro, MD;  Location: [facility];  Service: Ophthalmology;  Laterality: Right;  [id number]  . SCLERAL BUCKLE  08/11/2011   Procedure: SCLERAL BUCKLE;  Surgeon: Hayden Pedro, MD;  Location: [place];  Service: Ophthalmology;  Laterality: Right;  Marland Kitchen VARICOSE VEIN SURGERY     Family History  Problem Relation Age of Onset  . Heart disease Father   . Diabetes Son   . Kidney disease Neg Hx   . Prostate cancer Neg Hx    History  Sexual Activity  . Sexual activity: Not on file    Outpatient Encounter Prescriptions as of 01/18/2017  Medication Sig  . atorvastatin (LIPITOR) 10 MG  tablet Take 1 tablet (10 mg total) by mouth at bedtime.  . chlorpheniramine (CHLOR-TRIMETON) 4 MG tablet Take 4 mg by mouth 2 (two) times daily as needed for allergies.  Marland Kitchen clopidogrel (PLAVIX) 75 MG tablet Take 1 tablet (75 mg total) by mouth daily.  . diclofenac sodium (VOLTAREN) 1 % GEL Apply 4 g topically 4 (four) times daily as needed (for neck pain).  Marland Kitchen gabapentin (NEURONTIN) 300 MG capsule Take 1 capsule (300 mg total) by mouth 3 (three) times daily.  Alben Deeds Palmetto (MULTI GINSENG & SAW PALMETTO) 500 MG CAPS Take 500 mg by mouth daily.  Marland Kitchen losartan (COZAAR) 100 MG tablet Take 1 tablet (100 mg total) by mouth daily.  . meclizine (ANTIVERT) 25 MG tablet TAKE ONE TABLET BY MOUTH 3 TIMES DAILY AS NEEDED FOR DIZZINESS  . Multiple Vitamin (MULITIVITAMIN WITH MINERALS) TABS Take 1 tablet by mouth daily.  . Naproxen Sodium (ALEVE) 220 MG CAPS Take by mouth.  . ranitidine (ZANTAC) 150 MG tablet Take 150 mg by mouth 2 (two) times daily.  . tamsulosin (FLOMAX) 0.4 MG CAPS capsule Take 1 capsule (0.4 mg total) by mouth daily.  . methocarbamol (ROBAXIN) 500 MG tablet Take 500 mg by mouth at  bedtime.  . [DISCONTINUED] naproxen sodium (ANAPROX) 220 MG tablet Take 440 mg by mouth 2 (two) times daily as needed (for pain).    No facility-administered encounter medications on file as of 01/18/2017.     Activities of Daily Living In your present state of health, do you have any difficulty performing the following activities: 01/18/2017 04/14/2016  Hearing? Tempie Donning  Vision? N N  Difficulty concentrating or making decisions? Y N  Walking or climbing stairs? N N  Dressing or bathing? N N  Doing errands, shopping? N N  Preparing Food and eating ? N -  Using the Toilet? N -  In the past six months, have you accidently leaked urine? N -  Do you have problems with loss of bowel control? N -  Managing your Medications? N -  Managing your Finances? N -  Housekeeping or managing your Housekeeping? N -    Some recent data might be hidden    Patient Care Team: [name], DO as PCP - General (Family Medicine) Pieter Partridge, MD as Referring Physician (Hematology) Lequita Asal, MD as Referring Physician (Hematology and Oncology) Hayden Pedro, MD as Consulting Physician (Ophthalmology) Garvin Fila, MD as Consulting Physician (Neurology) Festus Aloe, MD as Consulting Physician (Urology) Minna Merritts, MD as Consulting Physician (Cardiology)   Assessment:     Exercise Activities and Dietary recommendations Current Exercise Habits: The patient has a physically strenous job, but has no regular exercise apart from work., Exercise limited by: None identified  Goals    None     Fall Risk Fall Risk  01/18/2017 04/14/2016 08/31/2015  Falls in the past year? Yes No No  Number falls in past yr: 1 - -  Injury with Fall? Yes - -  Follow up Falls prevention discussed - -   Depression Screen PHQ 2/9 Scores 01/18/2017 04/14/2016  PHQ - 2 Score 0 0    Cognitive Function     6CIT Screen 01/18/2017  What Year? 0 points  What month? 0 points  What time? 0 points  Count back from 20 0 points  Months in reverse 0 points  Repeat phrase 2 points  Total Score 2    Immunization History  Administered Date(s) Administered  . Influenza,inj,Quad PF,36+ Mos 03/03/2015  . Influenza-Unspecified 03/07/2016  . Pneumococcal Conjugate-13 07/08/2015  . Pneumococcal Polysaccharide-23 08/12/2011  . Td 10/11/2016  . Zoster 06/07/2011   Screening Tests Health Maintenance  Topic Date Due  . INFLUENZA VACCINE  01/04/2017  . TETANUS/TDAP  10/12/2026  . PNA vac Low Risk Adult  Completed      Plan:    I have personally reviewed and addressed the Medicare Annual Wellness questionnaire and have noted the following in the patient's chart:  A. Medical and social history B. Use of alcohol, tobacco or illicit drugs  C. Current medications and supplements D. Functional  ability and status E.  Nutritional status F.  Physical activity G. Advance directives H. List of other physicians I.  Hospitalizations, surgeries, and ER visits in previous 12 months J.  [place] such as hearing and vision if needed, cognitive and depression L. Referrals and appointments  In addition, I have reviewed and discussed with patient certain preventive protocols, quality metrics, and best practice recommendations. A written personalized care plan for preventive services as well as general preventive health recommendations were provided to patient.   Signed,  Tyler Aas, LPN Nurse Health Advisor   MD Recommendations: Problem  with left shoulder, fell in September of 2017. He will schedule an appt with Dr.Johnson.

## 2017-01-28 IMAGING — CT CT ABD-PELV W/ CM
2 of 5 series · 16 of 46 positions shown, 18 images · IV contrast (omnipaque)
Comparison: None.

CLINICAL DATA: Right lower quadrant mass, diarrhea, bowel
incontinence

EXAM:
CT ABDOMEN AND PELVIS WITH CONTRAST
TECHNIQUE: Multidetector CT imaging of the abdomen and pelvis was performed
using the standard protocol following bolus administration of
intravenous contrast.
CONTRAST:  100mL OMNIPAQUE IOHEXOL 300 MG/ML  SOLN

[Series 2: axial soft tissue · axial · 0.79mm/px · z∈[-932,-487]mm · 13 of 103 slices shown, 15 images]
[im 7/103  soft-tissue]
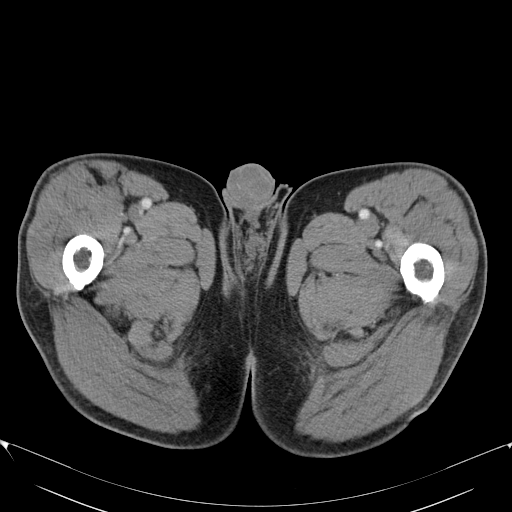
[im 7/103  bone]
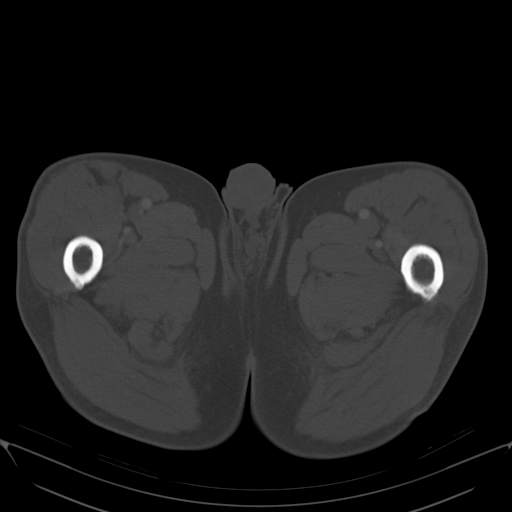
[im 13/103  soft-tissue]
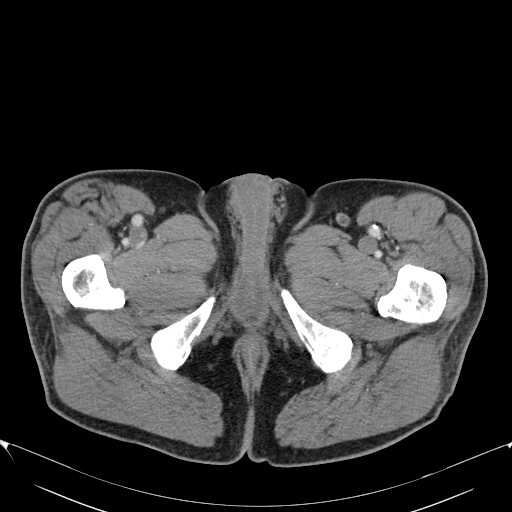
[im 20/103  soft-tissue]
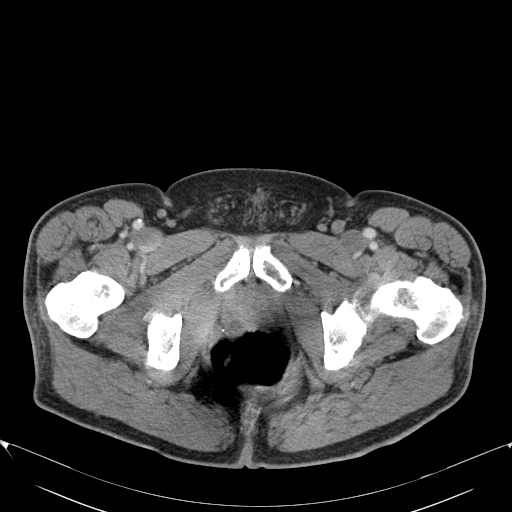
[im 32/103  soft-tissue]
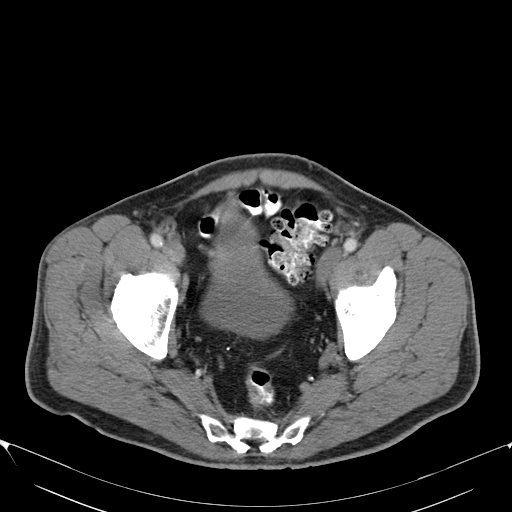
[im 39/103  soft-tissue]
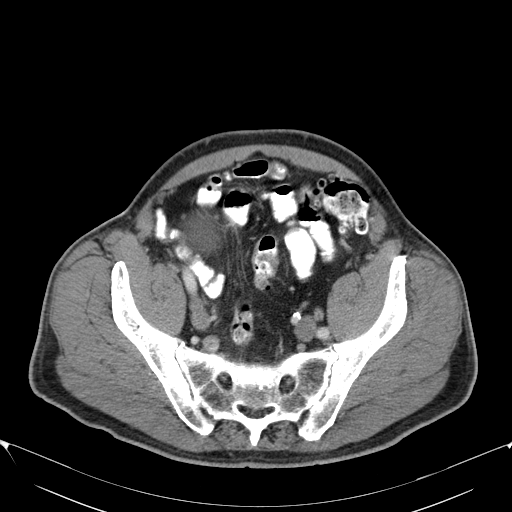
[im 45/103  soft-tissue]
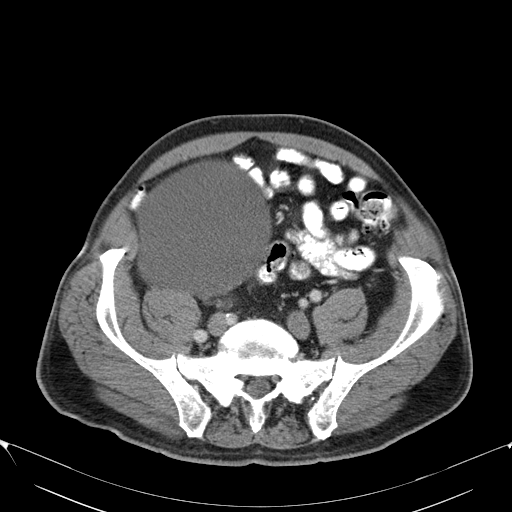
[im 52/103  soft-tissue]
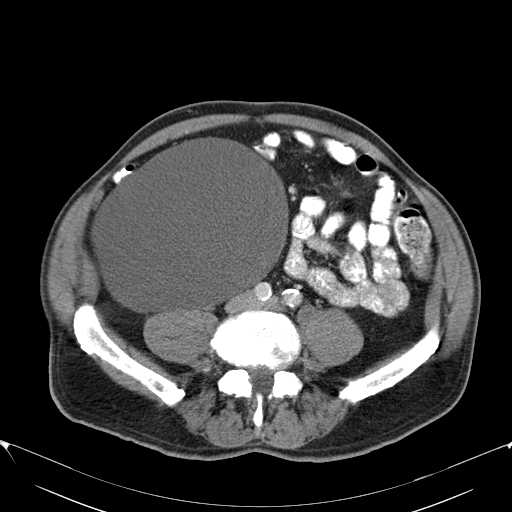
[im 58/103  soft-tissue]
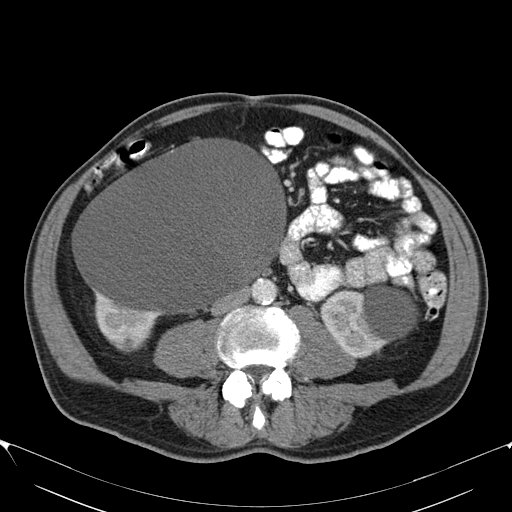
[im 64/103  soft-tissue]
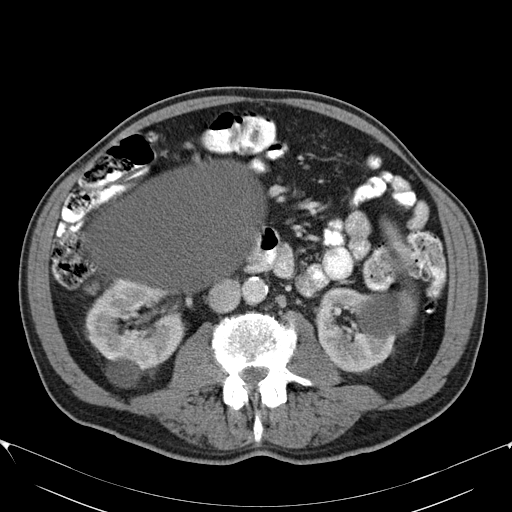
[im 64/103  bone]
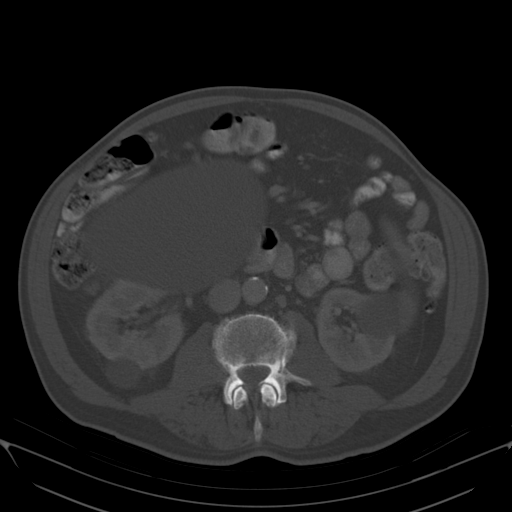
[im 71/103  soft-tissue]
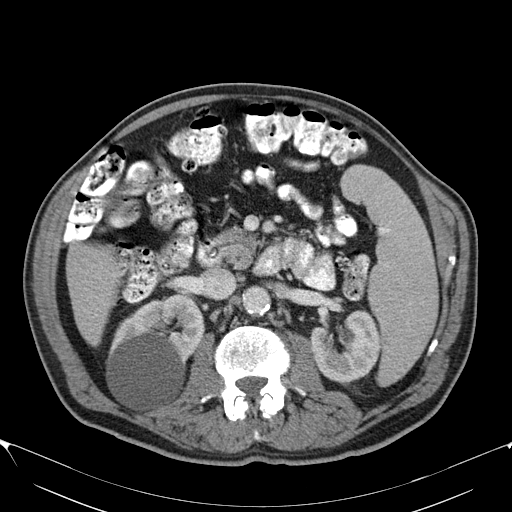
[im 83/103  soft-tissue]
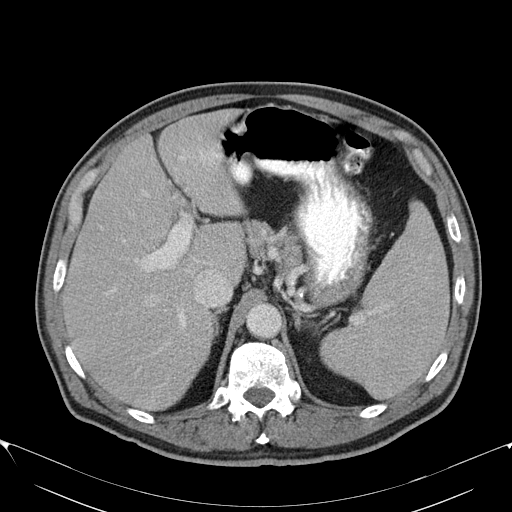
[im 90/103  soft-tissue]
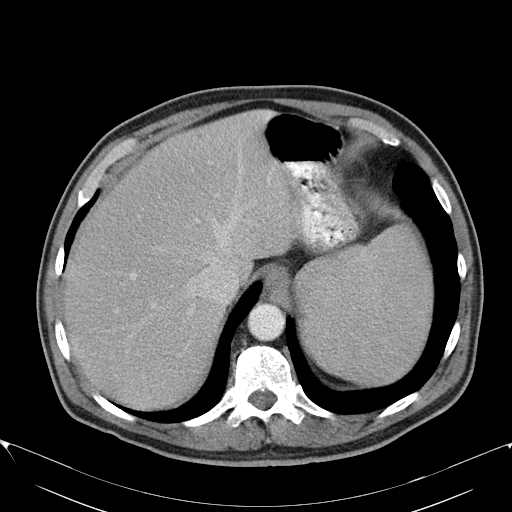
[im 96/103  soft-tissue]
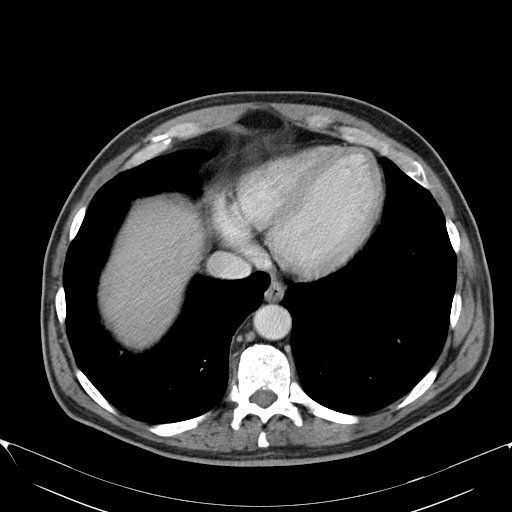

[Series 602: coronal · coronal · 0.99mm/px · 3 of 130 slices shown]
[im 44/130  soft-tissue]
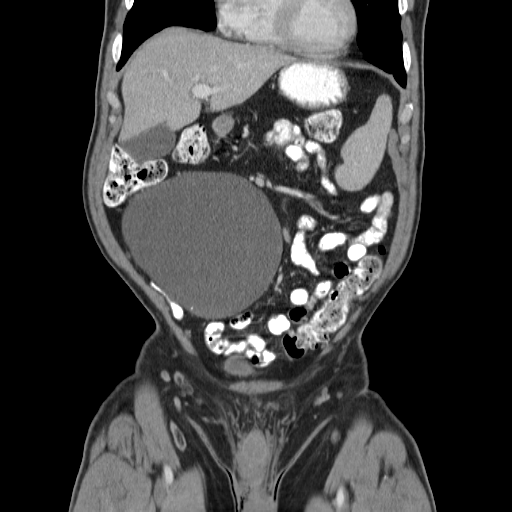
[im 58/130  soft-tissue]
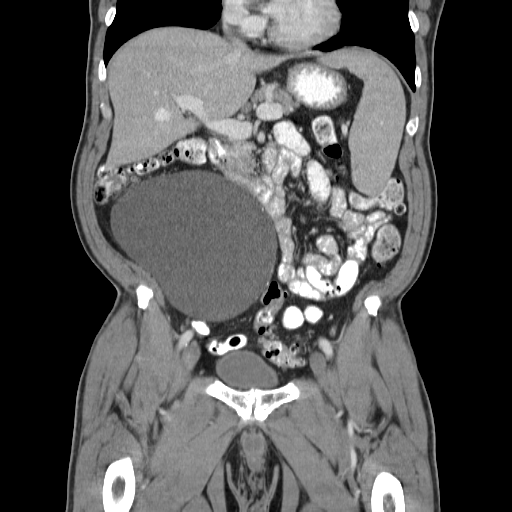
[im 72/130  soft-tissue]
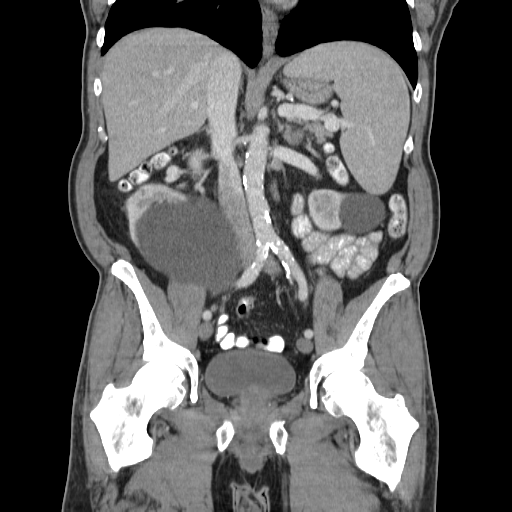

[16 of 46 positions shown; findings below may reference images not displayed]

FINDINGS: The lung bases are clear. The liver enhances with no focal
abnormality and no ductal dilatation is seen. No calcified
gallstones are noted. The pancreas is unremarkable and the
pancreatic duct is not dilated. The adrenal glands and spleen are
unremarkable although the spleen is slightly prominent. The kidneys
enhance and there are multiple renal cysts present. The largest cyst
emanates from the lower pole of the right kidney and extends
anteriorly most likely accounting for the palpable abnormality. This
right lower pole renal cyst measures 17.3 x 12.4 cm. There are few
foci of faint calcification and focal soft tissue within the wall of
this cyst making this cyst slightly complex. Minimal septation is
noted in the inferior aspect of the cyst laterally. No solid renal
lesion is seen. Small bilateral nonobstructing renal calculi are
noted. The proximal ureters are normal in caliber. The abdominal
aorta is normal in caliber with moderate atherosclerotic change
present. No adenopathy is seen.

The urinary bladder is not well distended but no abnormality is
seen. The prostate is normal in size for age. Multiple rectosigmoid
and distal descending colonic diverticula are present. There is
feces throughout the colon. The cecum is somewhat displaced cephalad
by the larger right lower pole renal cyst. The lumbar vertebrae are
normal alignment. There is degenerative disc disease primarily at
L4-5.
IMPRESSION: 1. The questioned mass on physical exam represents a large right
lower pole renal cyst which is slightly complex with some peripheral
soft tissue component, septation, and mural calcification. Multiple
bilateral renal cysts also are noted.
2. Nonobstructing bilateral renal calculi.
3. Multiple rectosigmoid and distal descending colon diverticula.

## 2017-01-30 ENCOUNTER — Ambulatory Visit: Payer: PRIVATE HEALTH INSURANCE | Admitting: Family Medicine

## 2017-02-09 ENCOUNTER — Ambulatory Visit (INDEPENDENT_AMBULATORY_CARE_PROVIDER_SITE_OTHER): Payer: Medicare Other | Admitting: Family Medicine

## 2017-02-09 ENCOUNTER — Encounter: Payer: Self-pay | Admitting: Family Medicine

## 2017-02-09 VITALS — BP 121/74 | HR 62 | Temp 98.4°F | Wt 187.5 lb

## 2017-02-09 DIAGNOSIS — I639 Cerebral infarction, unspecified: Secondary | ICD-10-CM

## 2017-02-09 DIAGNOSIS — M25511 Pain in right shoulder: Secondary | ICD-10-CM | POA: Diagnosis not present

## 2017-02-09 DIAGNOSIS — M1712 Unilateral primary osteoarthritis, left knee: Secondary | ICD-10-CM | POA: Diagnosis not present

## 2017-02-09 DIAGNOSIS — G8929 Other chronic pain: Secondary | ICD-10-CM

## 2017-02-09 DIAGNOSIS — Z23 Encounter for immunization: Secondary | ICD-10-CM | POA: Diagnosis not present

## 2017-02-09 NOTE — Progress Notes (Signed)
BP 121/74 (BP Location: Right Arm, Patient Position: Sitting, Cuff Size: Normal)   Pulse 62   Temp 98.4 F (36.9 C)   Wt 187 lb 8 oz (85 kg)   SpO2 98%   BMI 27.69 kg/m    Subjective:    Patient ID: [name], male    DOB: 03/01/1941, 76 y.o.   MRN: [id number]  HPI: [name] is a 76 y.o. male  Chief Complaint  Patient presents with  . Shoulder Pain    right  . Knee Pain    left   SHOULDER PAIN- took a bad fall in September and couldn't lift his arm. He saw the UC and had an x-ray. Has been doing exercises and using voltaren with no benefit.  Duration: About a year Involved shoulder: right Mechanism of injury: trauma Location: anterior Onset:sudden Severity: moderate  Quality:  Sharp, aching, throbbing Frequency: constant Radiation: no Aggravating factors: lifting and movement  Alleviating factors: nothing  Status: stable Treatments attempted: rest, sling, APAP, ibuprofen and HEP  Relief with NSAIDs?:  mild Weakness: yes Numbness: no Decreased grip strength: no Redness: no Swelling: no Bruising: no Fevers: no  L knee also continues to hurt. Has known arthritis. Has seen orthopedics in the past and had about 2-3 months benefit from steroid shot. Would like to see them again.  Relevant past medical, surgical, family and social history reviewed and updated as indicated. Interim medical history since our last visit reviewed. Allergies and medications reviewed and updated.  Review of Systems  Constitutional: Negative.   Respiratory: Negative.   Cardiovascular: Negative.   Musculoskeletal: Positive for arthralgias.  Psychiatric/Behavioral: Negative.     Per HPI unless specifically indicated above     Objective:    BP 121/74 (BP Location: Right Arm, Patient Position: Sitting, Cuff Size: Normal)   Pulse 62   Temp 98.4 F (36.9 C)   Wt 187 lb 8 oz (85 kg)   SpO2 98%   BMI 27.69 kg/m   Wt Readings from Last 3 Encounters:  02/09/17 187 lb 8 oz  (85 kg)  01/18/17 190 lb 4.8 oz (86.3 kg)  12/28/16 188 lb 7.9 oz (85.5 kg)    Physical Exam  Constitutional: He is oriented to person, place, and time. He appears well-developed and well-nourished. No distress.  HENT:  Head: Normocephalic and atraumatic.  Right Ear: Hearing normal.  Left Ear: Hearing normal.  Nose: Nose normal.  Eyes: Conjunctivae and lids are normal. Right eye exhibits no discharge. Left eye exhibits no discharge. No scleral icterus.  Cardiovascular: Normal rate, regular rhythm, normal heart sounds and intact distal pulses.  Exam reveals no gallop and no friction rub.   No murmur heard. Pulmonary/Chest: Effort normal and breath sounds normal. No respiratory distress. He has no wheezes. He has no rales. He exhibits no tenderness.  Musculoskeletal: Normal range of motion.  Neurological: He is alert and oriented to person, place, and time.  Skin: Skin is warm, dry and intact. No rash noted. He is not diaphoretic. No erythema. No pallor.  Psychiatric: He has a normal mood and affect. His speech is normal and behavior is normal. Judgment and thought content normal. Cognition and memory are normal.  Nursing note and vitals reviewed.    Shoulder: right    Inspection:  no swelling, ecchymosis, erythema or step off deformity.     Tenderness to Palpation:    Acromion: no    AC joint:no    Clavicle: no  Bicipital groove: yes    Scapular spine: no    Coracoid process: no    Humeral head: no    Supraspinatus tendon: no     Range of Motion:     Abduction:Decreased    Adduction: Decreased    Flexion: Decreased    Extension: Decreased    Internal rotation: Decreased    External rotation: Decreased    Painful arc: yes     Muscle Strength: 5/5 bilaterally     Neuro: Sensation WNL. and Upper extremity reflexes WNL.     Special Tests:     Neer sign: Negative    Hawkins sign: Positive    Cross arm adduction: Positive    Yergason sign: Positive    O'brien sign:  Negative     Speed sign: Negative  Results for orders placed or performed in visit on 12/28/16  Comprehensive metabolic panel  Result Value Ref Range   Sodium 138 135 - 145 mmol/L   Potassium 4.3 3.5 - 5.1 mmol/L   Chloride 104 101 - 111 mmol/L   CO2 27 22 - 32 mmol/L   Glucose, Bld 136 (H) 65 - 99 mg/dL   BUN 22 (H) 6 - 20 mg/dL   Creatinine, Ser 1.06 0.61 - 1.24 mg/dL   Calcium 8.8 (L) 8.9 - 10.3 mg/dL   Total Protein 6.6 6.5 - 8.1 g/dL   Albumin 3.9 3.5 - 5.0 g/dL   AST 28 15 - 41 U/L   ALT 27 17 - 63 U/L   Alkaline Phosphatase 111 38 - 126 U/L   Total Bilirubin 0.9 0.3 - 1.2 mg/dL   GFR calc non Af Amer >60 >60 mL/min   GFR calc Af Amer >60 >60 mL/min   Anion gap 7 5 - 15  CBC with Differential/Platelet  Result Value Ref Range   WBC 2.5 (L) 3.8 - 10.6 K/uL   RBC 3.16 (L) 4.40 - 5.90 MIL/uL   Hemoglobin 9.6 (L) 13.0 - 18.0 g/dL   HCT 28.6 (L) 40.0 - 52.0 %   MCV 90.6 80.0 - 100.0 fL   MCH 30.5 26.0 - 34.0 pg   MCHC 33.7 32.0 - 36.0 g/dL   RDW 17.9 (H) 11.5 - 14.5 %   Platelets 236 150 - 440 K/uL   Neutrophils Relative % 62 %   Neutro Abs 1.5 1.4 - 6.5 K/uL   Lymphocytes Relative 27 %   Lymphs Abs 0.7 (L) 1.0 - 3.6 K/uL   Monocytes Relative 11 %   Monocytes Absolute 0.3 0.2 - 1.0 K/uL   Eosinophils Relative 0 %   Eosinophils Absolute 0.0 0 - 0.7 K/uL   Basophils Relative 0 %   Basophils Absolute 0.0 0 - 0.1 K/uL  Lactate dehydrogenase  Result Value Ref Range   LDH 387 (H) 98 - 192 U/L      Assessment & Plan:   Problem List Items Addressed This Visit      Musculoskeletal and Integument   Primary osteoarthritis of left knee    Will follow up with orthopedics. Appointment scheduled today. Await their input.        Other Visit Diagnoses    Chronic right shoulder pain    -  Primary   No better after a year of conservative therapy. Will obtain MRI. Await results. Call with any concerns.    Relevant Orders   MR Shoulder Right Wo Contrast   Immunization due        Flu shot given today.  Relevant Orders   Flu vaccine HIGH DOSE PF (Fluzone High dose) (Completed)       Follow up plan: Return in about 3 months (around 05/11/2017) for 6 months follow up.

## 2017-02-09 NOTE — Patient Instructions (Addendum)

## 2017-02-09 NOTE — Assessment & Plan Note (Signed)
Will follow up with orthopedics. Appointment scheduled today. Await their input.

## 2017-02-10 DIAGNOSIS — M1712 Unilateral primary osteoarthritis, left knee: Secondary | ICD-10-CM | POA: Diagnosis not present

## 2017-02-13 ENCOUNTER — Other Ambulatory Visit: Payer: Self-pay | Admitting: Family Medicine

## 2017-02-15 ENCOUNTER — Ambulatory Visit
Admission: RE | Admit: 2017-02-15 | Discharge: 2017-02-15 | Disposition: A | Discharge status: Home / Self Care | Payer: Medicare Other | Source: Ambulatory Visit | Attending: Family Medicine | Admitting: Family Medicine

## 2017-02-15 DIAGNOSIS — M19011 Primary osteoarthritis, right shoulder: Secondary | ICD-10-CM | POA: Insufficient documentation

## 2017-02-15 DIAGNOSIS — X58XXXA Exposure to other specified factors, initial encounter: Secondary | ICD-10-CM | POA: Insufficient documentation

## 2017-02-15 DIAGNOSIS — M62511 Muscle wasting and atrophy, not elsewhere classified, right shoulder: Secondary | ICD-10-CM | POA: Insufficient documentation

## 2017-02-15 DIAGNOSIS — M25511 Pain in right shoulder: Secondary | ICD-10-CM | POA: Insufficient documentation

## 2017-02-15 DIAGNOSIS — G8929 Other chronic pain: Secondary | ICD-10-CM | POA: Diagnosis not present

## 2017-02-15 DIAGNOSIS — S46811A Strain of other muscles, fascia and tendons at shoulder and upper arm level, right arm, initial encounter: Secondary | ICD-10-CM | POA: Insufficient documentation

## 2017-02-16 ENCOUNTER — Telehealth: Payer: Self-pay | Admitting: Family Medicine

## 2017-02-16 DIAGNOSIS — S46811A Strain of other muscles, fascia and tendons at shoulder and upper arm level, right arm, initial encounter: Secondary | ICD-10-CM | POA: Insufficient documentation

## 2017-02-16 DIAGNOSIS — IMO0001 Reserved for inherently not codable concepts without codable children: Secondary | ICD-10-CM

## 2017-02-16 DIAGNOSIS — S46211A Strain of muscle, fascia and tendon of other parts of biceps, right arm, initial encounter: Secondary | ICD-10-CM | POA: Insufficient documentation

## 2017-02-16 NOTE — Telephone Encounter (Signed)
Patient notified of appointment. Keri: Please send over the information that they need.

## 2017-02-16 NOTE — Telephone Encounter (Signed)
Appointment scheduled for 02/28/17 @ 9:30am at [facility]

## 2017-02-16 NOTE — Telephone Encounter (Signed)
Routing to the Referral coordinator

## 2017-02-16 NOTE — Telephone Encounter (Signed)
Called about his shoulder MRI which shows several tears. Will get him into see ortho ASAP. Sees [facility] for his knee. New referral put in for the shoulder.   Tiff- I put this in as an urgent referral- can you call over there? Thanks!

## 2017-02-21 DIAGNOSIS — M1712 Unilateral primary osteoarthritis, left knee: Secondary | ICD-10-CM | POA: Diagnosis not present

## 2017-02-23 NOTE — Telephone Encounter (Signed)
Confirmed appointment with [facility].

## 2017-02-23 NOTE — Telephone Encounter (Signed)
[name], this is still in my box- I'm not sure they got what they needed-- any way to check?

## 2017-02-28 DIAGNOSIS — S46011A Strain of muscle(s) and tendon(s) of the rotator cuff of right shoulder, initial encounter: Secondary | ICD-10-CM | POA: Diagnosis not present

## 2017-02-28 DIAGNOSIS — M1712 Unilateral primary osteoarthritis, left knee: Secondary | ICD-10-CM | POA: Diagnosis not present

## 2017-03-02 DIAGNOSIS — M25511 Pain in right shoulder: Secondary | ICD-10-CM | POA: Diagnosis not present

## 2017-03-02 DIAGNOSIS — S46911D Strain of unspecified muscle, fascia and tendon at shoulder and upper arm level, right arm, subsequent encounter: Secondary | ICD-10-CM | POA: Diagnosis not present

## 2017-03-02 DIAGNOSIS — M7541 Impingement syndrome of right shoulder: Secondary | ICD-10-CM | POA: Diagnosis not present

## 2017-03-06 DIAGNOSIS — M7541 Impingement syndrome of right shoulder: Secondary | ICD-10-CM | POA: Diagnosis not present

## 2017-03-06 DIAGNOSIS — M25511 Pain in right shoulder: Secondary | ICD-10-CM | POA: Diagnosis not present

## 2017-03-07 DIAGNOSIS — M1712 Unilateral primary osteoarthritis, left knee: Secondary | ICD-10-CM | POA: Diagnosis not present

## 2017-03-08 DIAGNOSIS — M25511 Pain in right shoulder: Secondary | ICD-10-CM | POA: Diagnosis not present

## 2017-03-08 DIAGNOSIS — M7541 Impingement syndrome of right shoulder: Secondary | ICD-10-CM | POA: Diagnosis not present

## 2017-03-13 DIAGNOSIS — M7541 Impingement syndrome of right shoulder: Secondary | ICD-10-CM | POA: Diagnosis not present

## 2017-03-13 DIAGNOSIS — M25511 Pain in right shoulder: Secondary | ICD-10-CM | POA: Diagnosis not present

## 2017-03-15 DIAGNOSIS — M25519 Pain in unspecified shoulder: Secondary | ICD-10-CM | POA: Diagnosis not present

## 2017-03-15 DIAGNOSIS — M7541 Impingement syndrome of right shoulder: Secondary | ICD-10-CM | POA: Diagnosis not present

## 2017-03-15 DIAGNOSIS — M25511 Pain in right shoulder: Secondary | ICD-10-CM | POA: Diagnosis not present

## 2017-03-22 DIAGNOSIS — M7541 Impingement syndrome of right shoulder: Secondary | ICD-10-CM | POA: Diagnosis not present

## 2017-03-22 DIAGNOSIS — S46911S Strain of unspecified muscle, fascia and tendon at shoulder and upper arm level, right arm, sequela: Secondary | ICD-10-CM | POA: Diagnosis not present

## 2017-03-22 DIAGNOSIS — M25511 Pain in right shoulder: Secondary | ICD-10-CM | POA: Diagnosis not present

## 2017-03-27 DIAGNOSIS — M25511 Pain in right shoulder: Secondary | ICD-10-CM | POA: Diagnosis not present

## 2017-03-27 DIAGNOSIS — S46911S Strain of unspecified muscle, fascia and tendon at shoulder and upper arm level, right arm, sequela: Secondary | ICD-10-CM | POA: Diagnosis not present

## 2017-03-27 DIAGNOSIS — M7541 Impingement syndrome of right shoulder: Secondary | ICD-10-CM | POA: Diagnosis not present

## 2017-03-30 DIAGNOSIS — M7541 Impingement syndrome of right shoulder: Secondary | ICD-10-CM | POA: Diagnosis not present

## 2017-03-30 DIAGNOSIS — M25511 Pain in right shoulder: Secondary | ICD-10-CM | POA: Diagnosis not present

## 2017-04-05 DIAGNOSIS — S46911S Strain of unspecified muscle, fascia and tendon at shoulder and upper arm level, right arm, sequela: Secondary | ICD-10-CM | POA: Diagnosis not present

## 2017-04-05 DIAGNOSIS — M7541 Impingement syndrome of right shoulder: Secondary | ICD-10-CM | POA: Diagnosis not present

## 2017-04-05 DIAGNOSIS — M25511 Pain in right shoulder: Secondary | ICD-10-CM | POA: Diagnosis not present

## 2017-04-10 DIAGNOSIS — R162 Hepatomegaly with splenomegaly, not elsewhere classified: Secondary | ICD-10-CM | POA: Diagnosis not present

## 2017-04-10 DIAGNOSIS — D630 Anemia in neoplastic disease: Secondary | ICD-10-CM | POA: Diagnosis not present

## 2017-04-10 DIAGNOSIS — D471 Chronic myeloproliferative disease: Secondary | ICD-10-CM | POA: Diagnosis not present

## 2017-04-12 ENCOUNTER — Other Ambulatory Visit: Payer: Self-pay | Admitting: Family Medicine

## 2017-04-19 DIAGNOSIS — M7541 Impingement syndrome of right shoulder: Secondary | ICD-10-CM | POA: Diagnosis not present

## 2017-04-19 DIAGNOSIS — M25511 Pain in right shoulder: Secondary | ICD-10-CM | POA: Diagnosis not present

## 2017-04-19 DIAGNOSIS — S46911S Strain of unspecified muscle, fascia and tendon at shoulder and upper arm level, right arm, sequela: Secondary | ICD-10-CM | POA: Diagnosis not present

## 2017-04-25 ENCOUNTER — Other Ambulatory Visit: Payer: Self-pay | Admitting: Family Medicine

## 2017-05-17 DIAGNOSIS — M19011 Primary osteoarthritis, right shoulder: Secondary | ICD-10-CM | POA: Diagnosis not present

## 2017-06-23 ENCOUNTER — Ambulatory Visit: Payer: Medicare Other | Admitting: Podiatry

## 2017-06-28 ENCOUNTER — Inpatient Hospital Stay: Payer: Medicare Other | Admitting: Hematology and Oncology

## 2017-06-28 ENCOUNTER — Inpatient Hospital Stay: Payer: Medicare Other

## 2017-06-28 NOTE — Progress Notes (Deleted)
[place] [facility] day:  06/28/17  Chief Complaint: [name] is a 77 y.o. male  with myelofibrosis who is seen for 6 month assessment.  HPI: The patient was last seen in the medical oncology clinic on 12/28/2016.  At that time, he was asymptomatic. Exam revealed a palpable spleen tip.  Counts had decreased slightly.  He saw Dr.Murat Annabelle Harman at [facility] on 04/10/2017.  At that time, he was doing very well.  He was working full time 10-12 hours a day. He denied any complaints.   He denied fevers, night sweats, and itching.  Weight was stable. He denied any abdominal pain. Exam revealed mild hepatosplenomegaly.  Hemoglobin was better at 10.8, WBC 3,200, platelets 255,000, and WBC 3200.  Peripheral blood film revealed leukopenia.  There were no circulating blasts.  No hematologic intervention was indicated.  During the interim,    Past Medical History:  Diagnosis Date  . Arthritis   . Benign hypertensive renal disease   . GERD (gastroesophageal reflux disease)   . Heartburn   . History of retinal detachment   . Hyperlipidemia   . Hypertension   . Melanoma ([place])    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 approximately 10-15 years ago  . Myelofibrosis ([place])   . Prostate hypertrophy   . Stroke [facility]) June 2009   R brain subcortical infarct    Past Surgical History:  Procedure Laterality Date  . ASPIRATION / INJECTION RENAL CYST  Feb 2017  . BACK SURGERY     approx 20- 25 years ago  . GAS INSERTION  08/11/2011   Procedure: INSERTION OF GAS;  Surgeon: Hayden Pedro, MD;  Location: [place];  Service: Ophthalmology;  Laterality: Right;  C3F8  . SCLERAL BUCKLE  08/11/2011   Procedure: SCLERAL BUCKLE;  Surgeon: Hayden Pedro, MD;  Location: [place];  Service: Ophthalmology;  Laterality: Right;  Marland Kitchen VARICOSE VEIN SURGERY      Family History  Problem Relation Age of Onset  . Heart disease Father   . Diabetes Son   . Kidney disease Neg Hx   . Prostate  cancer Neg Hx     Social History:  reports that he quit smoking about 7 years ago. His smoking use included cigarettes. He smoked 0.25 packs per day. he has never used smokeless tobacco. He reports that he does not drink alcohol or use drugs.  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  He is alone today.  Allergies:  Allergies  Allergen Reactions  . Meloxicam Nausea And Vomiting    Current Medications: Current Outpatient Medications  Medication Sig Dispense Refill  . atorvastatin (LIPITOR) 10 MG tablet TAKE ONE TABLET BY MOUTH AT BEDTIME 90 tablet 1  . chlorpheniramine (CHLOR-TRIMETON) 4 MG tablet Take 4 mg by mouth 2 (two) times daily as needed for allergies.    Marland Kitchen clopidogrel (PLAVIX) 75 MG tablet Take 1 tablet (75 mg total) by mouth daily. 90 tablet 1  . diclofenac sodium (VOLTAREN) 1 % GEL Apply 4 g topically 4 (four) times daily as needed (for neck pain). 100 g 6  . gabapentin (NEURONTIN) 300 MG capsule Take 1 capsule (300 mg total) by mouth 3 (three) times daily. 270 capsule 3  . Ginsengs-Saw Palmetto (MULTI GINSENG & SAW PALMETTO) 500 MG CAPS Take 500 mg by mouth daily.    Marland Kitchen losartan (COZAAR) 100 MG tablet Take 1 tablet (100 mg total) by mouth daily.  90 tablet 1  . losartan (COZAAR) 100 MG tablet TAKE 1 TABLET BY MOUTH DAILY 90 tablet 1  . meclizine (ANTIVERT) 25 MG tablet TAKE ONE TABLET 3 TIMES DAILY AS NEEDED FOR DIZZINESS 90 tablet 0  . methocarbamol (ROBAXIN) 500 MG tablet Take 500 mg by mouth at bedtime.    . Multiple Vitamin (MULITIVITAMIN WITH MINERALS) TABS Take 1 tablet by mouth daily.    . Naproxen Sodium (ALEVE) 220 MG CAPS Take by mouth.    . ranitidine (ZANTAC) 150 MG tablet Take 150 mg by mouth 2 (two) times daily.    . tamsulosin (FLOMAX) 0.4 MG CAPS capsule Take 1 capsule (0.4 mg total) by mouth daily. 90 capsule 3   No current facility-administered medications for this visit.     Review of Systems:  GENERAL:  Feels good.  Active.  No  fevers or sweats.  Weight stable. PERFORMANCE STATUS (ECOG):  0 HEENT:  No visual changes, runny nose, sore throat, mouth sores or tenderness. Lungs: No shortness of breath or cough.  No hemoptysis. Cardiac:  No chest pain, palpitations, orthopnea, or PND. GI:  No nausea, vomiting, diarrhea, constipation, melena or hematochezia. GU:  No urgency, frequency, dysuria, or hematuria. Musculoskeletal:  No back pain.  No joint pain.  No muscle tenderness. Extremities:  No pain or swelling. Skin:  No rashes or skin changes. Neuro:  No headache, numbness or weakness, balance or coordination issues. Endocrine:  No diabetes, thyroid issues, hot flashes or night sweats. Psych:  No mood changes, depression or anxiety. Pain:  No focal pain. Review of systems:  All other systems reviewed and found to be negative.   Physical Exam: There were no vitals taken for this visit. GENERAL:  Well developed, well nourished, gentleman sitting comfortably in the exam room in no acute distress. MENTAL STATUS:  Alert and oriented to person, place and time. HEAD:  Short gray hair.  Normocephalic, atraumatic, face symmetric, no Cushingoid features. EYES:  Blue eyes.  Pupils equal round and reactive to light and accomodation.  No conjunctivitis or scleral icterus. ENT:  Oropharynx clear without lesion.  Tongue normal. Mucous membranes moist.  RESPIRATORY:  Clear to auscultation without rales, wheezes or rhonchi. CARDIOVASCULAR:  Regular rate and rhythm without murmur, rub or gallop. ABDOMEN:  Soft, non-tender, with active bowel sounds, and a palpable spleen tip (2 cm below costal margin).  No hepatomegaly.  No masses. SKIN:  No rashes, ulcers or lesions. EXTREMITIES: No edema, no skin discoloration or tenderness.  No palpable cords. LYMPH NODES: No palpable cervical, supraclavicular, axillary or inguinal adenopathy  NEUROLOGICAL: Unremarkable. PSYCH:  Appropriate.    No visits with results within 3 Day(s) from this  visit.  Latest known visit with results is:  Appointment on 12/28/2016  Component Date Value Ref Range Status  . Sodium 12/28/2016 138  135 - 145 mmol/L Final  . Potassium 12/28/2016 4.3  3.5 - 5.1 mmol/L Final  . Chloride 12/28/2016 104  101 - 111 mmol/L Final  . CO2 12/28/2016 27  22 - 32 mmol/L Final  . Glucose, Bld 12/28/2016 136* 65 - 99 mg/dL Final  . BUN 12/28/2016 22* 6 - 20 mg/dL Final  . Creatinine, Ser 12/28/2016 1.06  0.61 - 1.24 mg/dL Final  . Calcium 12/28/2016 8.8* 8.9 - 10.3 mg/dL Final  . Total Protein 12/28/2016 6.6  6.5 - 8.1 g/dL Final  . Albumin 12/28/2016 3.9  3.5 - 5.0 g/dL Final  . AST 12/28/2016 28  15 - 41  U/L Final  . ALT 12/28/2016 27  17 - 63 U/L Final  . Alkaline Phosphatase 12/28/2016 111  38 - 126 U/L Final  . Total Bilirubin 12/28/2016 0.9  0.3 - 1.2 mg/dL Final  . GFR calc non Af Amer 12/28/2016 >60  >60 mL/min Final  . GFR calc Af Amer 12/28/2016 >60  >60 mL/min Final   Comment: (NOTE) The eGFR has been calculated using the CKD EPI equation. This calculation has not been validated in all clinical situations. eGFR's persistently <60 mL/min signify possible Chronic Kidney Disease.   . Anion gap 12/28/2016 7  5 - 15 Final  . WBC 12/28/2016 2.5* 3.8 - 10.6 K/uL Final  . RBC 12/28/2016 3.16* 4.40 - 5.90 MIL/uL Final  . Hemoglobin 12/28/2016 9.6* 13.0 - 18.0 g/dL Final  . HCT 12/28/2016 28.6* 40.0 - 52.0 % Final  . MCV 12/28/2016 90.6  80.0 - 100.0 fL Final  . MCH 12/28/2016 30.5  26.0 - 34.0 pg Final  . MCHC 12/28/2016 33.7  32.0 - 36.0 g/dL Final  . RDW 12/28/2016 17.9* 11.5 - 14.5 % Final  . Platelets 12/28/2016 236  150 - 440 K/uL Final  . Neutrophils Relative % 12/28/2016 62  % Final  . Neutro Abs 12/28/2016 1.5  1.4 - 6.5 K/uL Final  . Lymphocytes Relative 12/28/2016 27  % Final  . Lymphs Abs 12/28/2016 0.7* 1.0 - 3.6 K/uL Final  . Monocytes Relative 12/28/2016 11  % Final  . Monocytes Absolute 12/28/2016 0.3  0.2 - 1.0 K/uL Final  .  Eosinophils Relative 12/28/2016 0  % Final  . Eosinophils Absolute 12/28/2016 0.0  0 - 0.7 K/uL Final  . Basophils Relative 12/28/2016 0  % Final  . Basophils Absolute 12/28/2016 0.0  0 - 0.1 K/uL Final  . LDH 12/28/2016 387* 98 - 192 U/L Final    Assessment:  [name] is a 77 y.o. 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  He presented with a long history of normocytic anemia unresponsive to oral iron.  He has mild leukopenia.  He has mild splenomegaly (15.8 cm) by abdominal ultrasound on 06/04/2015.  DIPPS score is 24 (age 17- 1, hemoglobin less than 10- 2) and score of 4 if 1% circulating blasts included from 07/17/2014.  Bone marrow on 06/11/2014 was most consistent with primary myelofibrosis.  Bone marrow biopsy showed 1% abnormal cells: CD45+, CD5+, CD10, CD11c+/-, CD19+, CD2-+, (dim), CD22+ (dim, CD23+, CD38-/+, FMC7-, HLA-DR+, sig lambda+(dim).  Blasts were not increased 1.2%; hypercellular for age: 34%; JAK2 V617F mutation was negative.  CALR mutation positive.  Flow cytometry included about 1% CLL/SLL phenotype cells (XB1+) of uncertain significance and some infiltrate into the marrow with increased atypical megakaryocytes.  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  MDS FISH panel was negative.  Labs in 05/2014 included the following normal studies: direct Coombs, epo level, haptoglobin, ANA, hemoglobin electrophoresis, hepatitis B surface antigen, HIV testing, UPEP.  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  LDH was elevated (520). Hepatitis C by PCR was less than 2 IU/ml.   Symptomatically, he is asymptomatic. Exam reveals a palpable spleen tip.  Counts have decreased slightly.  Plan: 1.  Review interval labs. 2.  Labs today:  CBC with diff, CMP, LDH.   3.  Review fever and neutropenia precautions.  Follow-up during the interim if any concerns. 4.  Continue to alternate appointments here and at [facility]. 5.  RTC in 6  months for MD assessment  and labs (CBC with diff, CMP, LDH).   Lequita Asal, MD  06/28/2017, 5:38 AM   I saw and evaluated the patient, participating in the key portions of the service and reviewing pertinent diagnostic studies and records.  I reviewed the nurse practitioner's note and agree with the findings and the plan.  The assessment and plan were discussed with the patient.  Additional diagnostic studies of *** are needed to clarify *** and would change the clinical management.  A few ***multiple questions were asked by the patient and answered.   Nolon Stalls, MD 06/28/2017,5:38 AM

## 2017-06-30 ENCOUNTER — Encounter: Payer: Self-pay | Admitting: Podiatry

## 2017-06-30 ENCOUNTER — Ambulatory Visit (INDEPENDENT_AMBULATORY_CARE_PROVIDER_SITE_OTHER): Payer: Medicare Other | Admitting: Podiatry

## 2017-06-30 DIAGNOSIS — L989 Disorder of the skin and subcutaneous tissue, unspecified: Secondary | ICD-10-CM

## 2017-06-30 DIAGNOSIS — B351 Tinea unguium: Secondary | ICD-10-CM

## 2017-06-30 MED ORDER — TERBINAFINE HCL 250 MG PO TABS: 250.0000 mg | ORAL_TABLET | Freq: Every day | ORAL | 0 refills | Status: DC

## 2017-06-30 NOTE — Progress Notes (Signed)
   Subjective:    Patient ID: [name], male    DOB: 13-Jun-1940, 77 y.o.   MRN: [id number]  HPI    Review of Systems  Musculoskeletal: Positive for myalgias.  Hematological: Bruises/bleeds easily.  All other systems reviewed and are negative.      Objective:   Physical Exam        Assessment & Plan:

## 2017-07-02 NOTE — Progress Notes (Signed)
   Subjective: 77 year old male presenting to the office today as a new patient with a chief complaint of stinging, burning pain to the medial left fifth toe that began about 6 months ago. He also reports fungus to bilateral great toenails. There are no modifying factors noted. He has tried OTC medications with no significant relief. Patient presents today for further treatment and evaluation.   Past Medical History:  Diagnosis Date  . Arthritis   . Benign hypertensive renal disease   . GERD (gastroesophageal reflux disease)   . Heartburn   . History of retinal detachment   . Hyperlipidemia   . Hypertension   . Melanoma ([place])    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 approximately 10-15 years ago  . Myelofibrosis ([place])   . Prostate hypertrophy   . Stroke [facility]) June 2009   R brain subcortical infarct     Objective:  Physical Exam General: Alert and oriented x3 in no acute distress  Dermatology: Hyperkeratotic lesion present on the medial left fifth toe. Pain on palpation with a central nucleated core noted. Hyperkeratotic, discolored, thickened, onychodystrophy of bilateral great toenails. Skin is warm, dry and supple bilateral lower extremities. Negative for open lesions or macerations.  Vascular: Palpable pedal pulses bilaterally. No edema or erythema noted. Capillary refill within normal limits.  Neurological: Epicritic and protective threshold grossly intact bilaterally.   Musculoskeletal Exam: Pain on palpation at the keratotic lesion noted. Range of motion within normal limits bilateral. Muscle strength 5/5 in all groups bilateral.  Assessment: #1 Porokeratosis left fifth toe, medial #2 Onychomycosis bilateral great toenails   Plan of Care:  #1 Patient evaluated #2 Excisional debridement of keratoic lesion using a chisel blade was performed without incident.  #3 Dressed area with light dressing. #4 Silicone toe caps dispensed.  #5 Prescription for Lamisil 250 mg #90  provided to patient. Patient states he had Lamisil in the past and it cleared it up.  #6 Return to clinic as needed.  Edrick Kins, DPM [facility]  Dr. Edrick Kins, [address]                                        [place], [place] [id number]                Office [phone]  Fax [phone]

## 2017-07-05 ENCOUNTER — Inpatient Hospital Stay: Payer: Medicare Other | Attending: Hematology and Oncology | Admitting: Hematology and Oncology

## 2017-07-05 ENCOUNTER — Inpatient Hospital Stay: Payer: Medicare Other

## 2017-07-05 ENCOUNTER — Encounter: Payer: Self-pay | Admitting: Hematology and Oncology

## 2017-07-05 VITALS — BP 113/72 | HR 80 | Temp 96.3°F | Resp 18 | Wt 193.1 lb

## 2017-07-05 DIAGNOSIS — D471 Chronic myeloproliferative disease: Secondary | ICD-10-CM | POA: Diagnosis not present

## 2017-07-05 DIAGNOSIS — D7581 Myelofibrosis: Secondary | ICD-10-CM

## 2017-07-05 DIAGNOSIS — R161 Splenomegaly, not elsewhere classified: Secondary | ICD-10-CM | POA: Diagnosis not present

## 2017-07-05 DIAGNOSIS — D72819 Decreased white blood cell count, unspecified: Secondary | ICD-10-CM | POA: Diagnosis not present

## 2017-07-05 DIAGNOSIS — D649 Anemia, unspecified: Secondary | ICD-10-CM | POA: Insufficient documentation

## 2017-07-05 LAB — CBC WITH DIFFERENTIAL/PLATELET
Basophils Absolute: 0 10*3/uL (ref 0–0.1)
Basophils Relative: 0 %
Eosinophils Absolute: 0 10*3/uL (ref 0–0.7)
Eosinophils Relative: 1 %
HCT: 30.3 % — ABNORMAL LOW (ref 40.0–52.0)
Hemoglobin: 10.5 g/dL — ABNORMAL LOW (ref 13.0–18.0)
Lymphocytes Relative: 23 %
Lymphs Abs: 0.6 10*3/uL — ABNORMAL LOW (ref 1.0–3.6)
MCH: 31.2 pg (ref 26.0–34.0)
MCHC: 34.7 g/dL (ref 32.0–36.0)
MCV: 90 fL (ref 80.0–100.0)
Monocytes Absolute: 0.4 10*3/uL (ref 0.2–1.0)
Monocytes Relative: 14 %
Neutro Abs: 1.8 10*3/uL (ref 1.4–6.5)
Neutrophils Relative %: 62 %
Platelets: 234 10*3/uL (ref 150–440)
RBC: 3.37 MIL/uL — ABNORMAL LOW (ref 4.40–5.90)
RDW: 16.9 % — ABNORMAL HIGH (ref 11.5–14.5)
WBC: 2.8 10*3/uL — ABNORMAL LOW (ref 3.8–10.6)

## 2017-07-05 LAB — COMPREHENSIVE METABOLIC PANEL
ALT: 26 U/L (ref 17–63)
AST: 27 U/L (ref 15–41)
Albumin: 4 g/dL (ref 3.5–5.0)
Alkaline Phosphatase: 104 U/L (ref 38–126)
Anion gap: 7 (ref 5–15)
BUN: 22 mg/dL — ABNORMAL HIGH (ref 6–20)
CO2: 24 mmol/L (ref 22–32)
Calcium: 8.5 mg/dL — ABNORMAL LOW (ref 8.9–10.3)
Chloride: 107 mmol/L (ref 101–111)
Creatinine, Ser: 1.03 mg/dL (ref 0.61–1.24)
GFR calc Af Amer: >60 mL/min (ref >60)
GFR calc non Af Amer: >60 mL/min (ref >60)
Glucose, Bld: 96 mg/dL (ref 65–99)
Potassium: 4.6 mmol/L (ref 3.5–5.1)
Sodium: 138 mmol/L (ref 135–145)
Total Bilirubin: 1 mg/dL (ref 0.3–1.2)
Total Protein: 6.9 g/dL (ref 6.5–8.1)

## 2017-07-05 LAB — LACTATE DEHYDROGENASE: LDH: 434 U/L — ABNORMAL HIGH (ref 98–192)

## 2017-07-05 NOTE — Progress Notes (Signed)
[place] [facility] day:  07/05/17  Chief Complaint: [name] is a 77 y.o. male  with myelofibrosis who is seen for 6 month assessment.  HPI: The patient was last seen in the medical oncology clinic on 12/28/2016.  At that time, he was asymptomatic. Exam revealed a palpable spleen tip.  Counts had decreased slightly.  He saw Dr.Murat Annabelle Harman at [facility] on 04/10/2017.  At that time, he was doing very well.  He was working full time 10-12 hours a day.  He denied any complaints.   He denied fevers, night sweats, and itching.  Weight was stable. He denied any abdominal pain. Exam revealed mild hepatosplenomegaly.  Hemoglobin was better at 10.8, WBC 3,200, platelets 255,000, and WBC 3200.  Peripheral blood film revealed leukopenia.  There were no circulating blasts.  No hematologic intervention was indicated.  During the interim, patient is doing well.  He denies an acute symptoms. Patient continues to work full time. He is working 10-12 hours a day 6 days a week. He has experienced no B symptoms or interval infections. Patient denies early satiety. Patient is eating well. He has gained 6 pounds. Patient denies pain in the clinic today.    Past Medical History:  Diagnosis Date  . Arthritis   . Benign hypertensive renal disease   . GERD (gastroesophageal reflux disease)   . Heartburn   . History of retinal detachment   . Hyperlipidemia   . Hypertension   . Melanoma ([place])    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 approximately 10-15 years ago  . Myelofibrosis ([place])   . Prostate hypertrophy   . Stroke [facility]) June 2009   R brain subcortical infarct    Past Surgical History:  Procedure Laterality Date  . ASPIRATION / INJECTION RENAL CYST  Feb 2017  . BACK SURGERY     approx 20- 25 years ago  . GAS INSERTION  08/11/2011   Procedure: INSERTION OF GAS;  Surgeon: Hayden Pedro, MD;  Location: [place];  Service: Ophthalmology;  Laterality: Right;  C3F8  .  SCLERAL BUCKLE  08/11/2011   Procedure: SCLERAL BUCKLE;  Surgeon: Hayden Pedro, MD;  Location: [place];  Service: Ophthalmology;  Laterality: Right;  Marland Kitchen VARICOSE VEIN SURGERY      Family History  Problem Relation Age of Onset  . Heart disease Father   . Diabetes Son   . Kidney disease Neg Hx   . Prostate cancer Neg Hx     Social History:  reports that he quit smoking about 7 years ago. His smoking use included cigarettes. He smoked 0.25 packs per day. he has never used smokeless tobacco. He reports that he does not drink alcohol or use drugs.  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  Allergies:  Allergies  Allergen Reactions  . Meloxicam Nausea And Vomiting    Current Medications: Current Outpatient Medications  Medication Sig Dispense Refill  . atorvastatin (LIPITOR) 10 MG tablet TAKE ONE TABLET BY MOUTH AT BEDTIME 90 tablet 1  . chlorpheniramine (CHLOR-TRIMETON) 4 MG tablet Take 4 mg by mouth 2 (two) times daily as needed for allergies.    Marland Kitchen clopidogrel (PLAVIX) 75 MG tablet Take 1 tablet (75 mg total) by mouth daily. 90 tablet 1  . diclofenac sodium (VOLTAREN) 1 % GEL Apply 4 g topically 4 (four) times daily as needed (for neck pain). 100 g 6  .  gabapentin (NEURONTIN) 300 MG capsule Take 1 capsule (300 mg total) by mouth 3 (three) times daily. 270 capsule 3  . Ginsengs-Saw Palmetto (MULTI GINSENG & SAW PALMETTO) 500 MG CAPS Take 500 mg by mouth daily.    Marland Kitchen losartan (COZAAR) 100 MG tablet TAKE 1 TABLET BY MOUTH DAILY 90 tablet 1  . meclizine (ANTIVERT) 25 MG tablet TAKE ONE TABLET 3 TIMES DAILY AS NEEDED FOR DIZZINESS 90 tablet 0  . Multiple Vitamin (MULITIVITAMIN WITH MINERALS) TABS Take 1 tablet by mouth daily.    . Naproxen Sodium (ALEVE) 220 MG CAPS Take by mouth.    . ranitidine (ZANTAC) 150 MG tablet Take 150 mg by mouth 2 (two) times daily.    . tamsulosin (FLOMAX) 0.4 MG CAPS capsule Take 1 capsule (0.4 mg total) by mouth daily.  90 capsule 3  . terbinafine (LAMISIL) 250 MG tablet Take 1 tablet (250 mg total) by mouth daily. 90 tablet 0   No current facility-administered medications for this visit.     Review of Systems:  GENERAL:  Feels good.  Active.  No fevers or sweats.  Weight up 6 pounds.  PERFORMANCE STATUS (ECOG):  0 HEENT:  No visual changes, runny nose, sore throat, mouth sores or tenderness. Lungs: No shortness of breath or cough.  No hemoptysis. Cardiac:  No chest pain, palpitations, orthopnea, or PND. GI:  No nausea, vomiting, diarrhea, constipation, melena or hematochezia. GU:  No urgency, frequency, dysuria, or hematuria. Musculoskeletal:  No back pain.  No joint pain.  No muscle tenderness. Extremities:  No pain or swelling. Skin:  No rashes or skin changes. Neuro:  No headache, numbness or weakness, balance or coordination issues. Endocrine:  No diabetes, thyroid issues, hot flashes or night sweats. Psych:  No mood changes, depression or anxiety. Pain:  No focal pain. Review of systems:  All other systems reviewed and found to be negative.   Physical Exam: Blood pressure 113/72, pulse 80, temperature (!) 96.3 F (35.7 C), temperature source Tympanic, resp. rate 18, weight 193 lb 2 oz (87.6 kg). GENERAL:  Well developed, well nourished, gentleman sitting comfortably in the exam room in no acute distress. MENTAL STATUS:  Alert and oriented to person, place and time. HEAD:  Short gray hair.  Normocephalic, atraumatic, face symmetric, no Cushingoid features. EYES:  Blue eyes.  Pupils equal round and reactive to light and accomodation.  No conjunctivitis or scleral icterus. ENT:  Oropharynx clear without lesion.  Tongue normal. Mucous membranes moist.  RESPIRATORY:  Clear to auscultation without rales, wheezes or rhonchi. CARDIOVASCULAR:  Regular rate and rhythm without murmur, rub or gallop. ABDOMEN:  Soft, non-tender, with active bowel sounds, and a palpable spleen tip (2 cm below costal  margin).  No hepatomegaly.  No masses. SKIN:  No rashes, ulcers or lesions. EXTREMITIES: No edema, no skin discoloration or tenderness.  No palpable cords. LYMPH NODES: No palpable cervical, supraclavicular, axillary or inguinal adenopathy  NEUROLOGICAL: Unremarkable. PSYCH:  Appropriate.    Appointment on 07/05/2017  Component Date Value Ref Range Status  . LDH 07/05/2017 434* 98 - 192 U/L Final   Performed at [facility], [address] [place], [place] [address]  . Sodium 07/05/2017 138  135 - 145 mmol/L Final  . Potassium 07/05/2017 4.6  3.5 - 5.1 mmol/L Final  . Chloride 07/05/2017 107  101 - 111 mmol/L Final  . CO2 07/05/2017 24  22 - 32 mmol/L Final  . Glucose, Bld 07/05/2017 96  65 -  99 mg/dL Final  . BUN 07/05/2017 22* 6 - 20 mg/dL Final  . Creatinine, Ser 07/05/2017 1.03  0.61 - 1.24 mg/dL Final  . Calcium 07/05/2017 8.5* 8.9 - 10.3 mg/dL Final  . Total Protein 07/05/2017 6.9  6.5 - 8.1 g/dL Final  . Albumin 07/05/2017 4.0  3.5 - 5.0 g/dL Final  . AST 07/05/2017 27  15 - 41 U/L Final  . ALT 07/05/2017 26  17 - 63 U/L Final  . Alkaline Phosphatase 07/05/2017 104  38 - 126 U/L Final  . Total Bilirubin 07/05/2017 1.0  0.3 - 1.2 mg/dL Final  . GFR calc non Af Amer 07/05/2017 >60  >60 mL/min Final  . GFR calc Af Amer 07/05/2017 >60  >60 mL/min Final   Comment: (NOTE) The eGFR has been calculated using the CKD EPI equation. This calculation has not been validated in all clinical situations. eGFR's persistently <60 mL/min signify possible Chronic Kidney Disease.   Georgiann Hahn gap 07/05/2017 7  5 - 15 Final   Performed at [facility], [address], [place], [place] [address]  . WBC 07/05/2017 2.8* 3.8 - 10.6 K/uL Final  . RBC 07/05/2017 3.37* 4.40 - 5.90 MIL/uL Final  . Hemoglobin 07/05/2017 10.5* 13.0 - 18.0 g/dL Final  . HCT 07/05/2017 30.3* 40.0 - 52.0 % Final  . MCV 07/05/2017 90.0  80.0 - 100.0 fL Final  . MCH 07/05/2017 31.2  26.0 - 34.0 pg  Final  . MCHC 07/05/2017 34.7  32.0 - 36.0 g/dL Final  . RDW 07/05/2017 16.9* 11.5 - 14.5 % Final  . Platelets 07/05/2017 234  150 - 440 K/uL Final  . Neutrophils Relative % 07/05/2017 62  % Final  . Neutro Abs 07/05/2017 1.8  1.4 - 6.5 K/uL Final  . Lymphocytes Relative 07/05/2017 23  % Final  . Lymphs Abs 07/05/2017 0.6* 1.0 - 3.6 K/uL Final  . Monocytes Relative 07/05/2017 14  % Final  . Monocytes Absolute 07/05/2017 0.4  0.2 - 1.0 K/uL Final  . Eosinophils Relative 07/05/2017 1  % Final  . Eosinophils Absolute 07/05/2017 0.0  0 - 0.7 K/uL Final  . Basophils Relative 07/05/2017 0  % Final  . Basophils Absolute 07/05/2017 0.0  0 - 0.1 K/uL Final   Performed at [facility], [address], [place], [place] [address]    Assessment:  [name] is a 77 y.o. 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  He presented with a long history of normocytic anemia unresponsive to oral iron.  He has mild leukopenia.  He has mild splenomegaly (15.8 cm) by abdominal ultrasound on 06/04/2015.  DIPPS score is 32 (age 90- 1, hemoglobin less than 10- 2) and score of 4 if 1% circulating blasts included from 07/17/2014.  Bone marrow on 06/11/2014 was most consistent with primary myelofibrosis.  Bone marrow biopsy showed 1% abnormal cells: CD45+, CD5+, CD10, CD11c+/-, CD19+, CD2-+, (dim), CD22+ (dim, CD23+, CD38-/+, FMC7-, HLA-DR+, sig lambda+(dim).  Blasts were not increased 1.2%; hypercellular for age: 4%; JAK2 V617F mutation was negative.  CALR mutation positive.  Flow cytometry included about 1% CLL/SLL phenotype cells (XV4+) of uncertain significance and some infiltrate into the marrow with increased atypical megakaryocytes.  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  MDS FISH panel was negative.  Labs in 05/2014 included the following normal studies: direct Coombs, epo level, haptoglobin, ANA, hemoglobin electrophoresis, hepatitis B surface antigen, HIV  testing, UPEP.  SPEP revealed no monoclonal  protein, but hypogammaglobulinemia.  LDH was elevated (520). Hepatitis C by PCR was less than 2 IU/ml.   Symptomatically, he feels good.  Exam reveals a stable palpable spleen tip.  Counts are stable.   Plan: 1.  Review interval labs. 2.  Labs today:  CBC with diff, CMP, LDH. 3.  Review fever and neutropenia precautions.  Follow-up during the interim if any concerns. 4.  Continue to alternate appointments here and at [facility] 5.  RTC in 6 months for MD assessment and labs (CBC with diff, CMP, LDH).   Honor Loh, NP  07/05/2017, 2:38 PM   I saw and evaluated the patient, participating in the key portions of the service and reviewing pertinent diagnostic studies and records.  I reviewed the nurse practitioner's note and agree with the findings and the plan.  The assessment and plan were discussed with the patient.  A few questions were asked by the patient and answered.   Nolon Stalls, MD 07/05/2017,2:38 PM

## 2017-07-05 NOTE — Progress Notes (Signed)
Patient offers no complaints today. 

## 2017-07-20 ENCOUNTER — Other Ambulatory Visit: Payer: Self-pay | Admitting: Family Medicine

## 2017-08-10 DIAGNOSIS — M5136 Other intervertebral disc degeneration, lumbar region: Secondary | ICD-10-CM | POA: Diagnosis not present

## 2017-08-10 DIAGNOSIS — M5416 Radiculopathy, lumbar region: Secondary | ICD-10-CM | POA: Diagnosis not present

## 2017-08-10 DIAGNOSIS — M9903 Segmental and somatic dysfunction of lumbar region: Secondary | ICD-10-CM | POA: Diagnosis not present

## 2017-08-10 DIAGNOSIS — M9905 Segmental and somatic dysfunction of pelvic region: Secondary | ICD-10-CM | POA: Diagnosis not present

## 2017-08-11 DIAGNOSIS — M5416 Radiculopathy, lumbar region: Secondary | ICD-10-CM | POA: Diagnosis not present

## 2017-08-11 DIAGNOSIS — M9905 Segmental and somatic dysfunction of pelvic region: Secondary | ICD-10-CM | POA: Diagnosis not present

## 2017-08-11 DIAGNOSIS — M9903 Segmental and somatic dysfunction of lumbar region: Secondary | ICD-10-CM | POA: Diagnosis not present

## 2017-08-11 DIAGNOSIS — M5136 Other intervertebral disc degeneration, lumbar region: Secondary | ICD-10-CM | POA: Diagnosis not present

## 2017-08-14 DIAGNOSIS — M5416 Radiculopathy, lumbar region: Secondary | ICD-10-CM | POA: Diagnosis not present

## 2017-08-14 DIAGNOSIS — M5136 Other intervertebral disc degeneration, lumbar region: Secondary | ICD-10-CM | POA: Diagnosis not present

## 2017-08-14 DIAGNOSIS — M9905 Segmental and somatic dysfunction of pelvic region: Secondary | ICD-10-CM | POA: Diagnosis not present

## 2017-08-14 DIAGNOSIS — M9903 Segmental and somatic dysfunction of lumbar region: Secondary | ICD-10-CM | POA: Diagnosis not present

## 2017-08-16 ENCOUNTER — Other Ambulatory Visit: Payer: Self-pay

## 2017-08-16 DIAGNOSIS — M9903 Segmental and somatic dysfunction of lumbar region: Secondary | ICD-10-CM | POA: Diagnosis not present

## 2017-08-16 DIAGNOSIS — M5416 Radiculopathy, lumbar region: Secondary | ICD-10-CM | POA: Diagnosis not present

## 2017-08-16 DIAGNOSIS — M9905 Segmental and somatic dysfunction of pelvic region: Secondary | ICD-10-CM | POA: Diagnosis not present

## 2017-08-16 DIAGNOSIS — M5136 Other intervertebral disc degeneration, lumbar region: Secondary | ICD-10-CM | POA: Diagnosis not present

## 2017-08-16 MED ORDER — GABAPENTIN 300 MG PO CAPS: 300.0000 mg | ORAL_CAPSULE | Freq: Three times a day (TID) | ORAL | 3 refills | Status: DC

## 2017-08-16 NOTE — Telephone Encounter (Signed)
Pt requesting medication from mail order

## 2017-08-17 DIAGNOSIS — M9905 Segmental and somatic dysfunction of pelvic region: Secondary | ICD-10-CM | POA: Diagnosis not present

## 2017-08-17 DIAGNOSIS — M9903 Segmental and somatic dysfunction of lumbar region: Secondary | ICD-10-CM | POA: Diagnosis not present

## 2017-08-17 DIAGNOSIS — M5136 Other intervertebral disc degeneration, lumbar region: Secondary | ICD-10-CM | POA: Diagnosis not present

## 2017-08-17 DIAGNOSIS — M5416 Radiculopathy, lumbar region: Secondary | ICD-10-CM | POA: Diagnosis not present

## 2017-08-21 DIAGNOSIS — M9903 Segmental and somatic dysfunction of lumbar region: Secondary | ICD-10-CM | POA: Diagnosis not present

## 2017-08-21 DIAGNOSIS — M5136 Other intervertebral disc degeneration, lumbar region: Secondary | ICD-10-CM | POA: Diagnosis not present

## 2017-08-21 DIAGNOSIS — M5416 Radiculopathy, lumbar region: Secondary | ICD-10-CM | POA: Diagnosis not present

## 2017-08-21 DIAGNOSIS — M9905 Segmental and somatic dysfunction of pelvic region: Secondary | ICD-10-CM | POA: Diagnosis not present

## 2017-08-23 DIAGNOSIS — M5136 Other intervertebral disc degeneration, lumbar region: Secondary | ICD-10-CM | POA: Diagnosis not present

## 2017-08-23 DIAGNOSIS — M9905 Segmental and somatic dysfunction of pelvic region: Secondary | ICD-10-CM | POA: Diagnosis not present

## 2017-08-23 DIAGNOSIS — M5416 Radiculopathy, lumbar region: Secondary | ICD-10-CM | POA: Diagnosis not present

## 2017-08-23 DIAGNOSIS — M9903 Segmental and somatic dysfunction of lumbar region: Secondary | ICD-10-CM | POA: Diagnosis not present

## 2017-08-24 DIAGNOSIS — M9905 Segmental and somatic dysfunction of pelvic region: Secondary | ICD-10-CM | POA: Diagnosis not present

## 2017-08-24 DIAGNOSIS — M5136 Other intervertebral disc degeneration, lumbar region: Secondary | ICD-10-CM | POA: Diagnosis not present

## 2017-08-24 DIAGNOSIS — M5416 Radiculopathy, lumbar region: Secondary | ICD-10-CM | POA: Diagnosis not present

## 2017-08-24 DIAGNOSIS — M9903 Segmental and somatic dysfunction of lumbar region: Secondary | ICD-10-CM | POA: Diagnosis not present

## 2017-08-28 DIAGNOSIS — M5416 Radiculopathy, lumbar region: Secondary | ICD-10-CM | POA: Diagnosis not present

## 2017-08-28 DIAGNOSIS — M9905 Segmental and somatic dysfunction of pelvic region: Secondary | ICD-10-CM | POA: Diagnosis not present

## 2017-08-28 DIAGNOSIS — M9903 Segmental and somatic dysfunction of lumbar region: Secondary | ICD-10-CM | POA: Diagnosis not present

## 2017-08-28 DIAGNOSIS — M5136 Other intervertebral disc degeneration, lumbar region: Secondary | ICD-10-CM | POA: Diagnosis not present

## 2017-08-30 DIAGNOSIS — M5136 Other intervertebral disc degeneration, lumbar region: Secondary | ICD-10-CM | POA: Diagnosis not present

## 2017-08-30 DIAGNOSIS — M9905 Segmental and somatic dysfunction of pelvic region: Secondary | ICD-10-CM | POA: Diagnosis not present

## 2017-08-30 DIAGNOSIS — M5416 Radiculopathy, lumbar region: Secondary | ICD-10-CM | POA: Diagnosis not present

## 2017-08-30 DIAGNOSIS — M9903 Segmental and somatic dysfunction of lumbar region: Secondary | ICD-10-CM | POA: Diagnosis not present

## 2017-08-31 DIAGNOSIS — M5416 Radiculopathy, lumbar region: Secondary | ICD-10-CM | POA: Diagnosis not present

## 2017-08-31 DIAGNOSIS — M9905 Segmental and somatic dysfunction of pelvic region: Secondary | ICD-10-CM | POA: Diagnosis not present

## 2017-08-31 DIAGNOSIS — M9903 Segmental and somatic dysfunction of lumbar region: Secondary | ICD-10-CM | POA: Diagnosis not present

## 2017-08-31 DIAGNOSIS — M5136 Other intervertebral disc degeneration, lumbar region: Secondary | ICD-10-CM | POA: Diagnosis not present

## 2017-09-04 DIAGNOSIS — M9905 Segmental and somatic dysfunction of pelvic region: Secondary | ICD-10-CM | POA: Diagnosis not present

## 2017-09-04 DIAGNOSIS — M5416 Radiculopathy, lumbar region: Secondary | ICD-10-CM | POA: Diagnosis not present

## 2017-09-04 DIAGNOSIS — M9903 Segmental and somatic dysfunction of lumbar region: Secondary | ICD-10-CM | POA: Diagnosis not present

## 2017-09-04 DIAGNOSIS — M5136 Other intervertebral disc degeneration, lumbar region: Secondary | ICD-10-CM | POA: Diagnosis not present

## 2017-09-06 DIAGNOSIS — M9905 Segmental and somatic dysfunction of pelvic region: Secondary | ICD-10-CM | POA: Diagnosis not present

## 2017-09-06 DIAGNOSIS — M9903 Segmental and somatic dysfunction of lumbar region: Secondary | ICD-10-CM | POA: Diagnosis not present

## 2017-09-06 DIAGNOSIS — M5136 Other intervertebral disc degeneration, lumbar region: Secondary | ICD-10-CM | POA: Diagnosis not present

## 2017-09-06 DIAGNOSIS — M5416 Radiculopathy, lumbar region: Secondary | ICD-10-CM | POA: Diagnosis not present

## 2017-09-07 DIAGNOSIS — M9905 Segmental and somatic dysfunction of pelvic region: Secondary | ICD-10-CM | POA: Diagnosis not present

## 2017-09-07 DIAGNOSIS — M9903 Segmental and somatic dysfunction of lumbar region: Secondary | ICD-10-CM | POA: Diagnosis not present

## 2017-09-07 DIAGNOSIS — M5416 Radiculopathy, lumbar region: Secondary | ICD-10-CM | POA: Diagnosis not present

## 2017-09-07 DIAGNOSIS — M5136 Other intervertebral disc degeneration, lumbar region: Secondary | ICD-10-CM | POA: Diagnosis not present

## 2017-09-12 DIAGNOSIS — M9903 Segmental and somatic dysfunction of lumbar region: Secondary | ICD-10-CM | POA: Diagnosis not present

## 2017-09-12 DIAGNOSIS — M5416 Radiculopathy, lumbar region: Secondary | ICD-10-CM | POA: Diagnosis not present

## 2017-09-12 DIAGNOSIS — M5136 Other intervertebral disc degeneration, lumbar region: Secondary | ICD-10-CM | POA: Diagnosis not present

## 2017-09-12 DIAGNOSIS — M9905 Segmental and somatic dysfunction of pelvic region: Secondary | ICD-10-CM | POA: Diagnosis not present

## 2017-09-18 DIAGNOSIS — M5136 Other intervertebral disc degeneration, lumbar region: Secondary | ICD-10-CM | POA: Diagnosis not present

## 2017-09-18 DIAGNOSIS — M5416 Radiculopathy, lumbar region: Secondary | ICD-10-CM | POA: Diagnosis not present

## 2017-09-18 DIAGNOSIS — M9903 Segmental and somatic dysfunction of lumbar region: Secondary | ICD-10-CM | POA: Diagnosis not present

## 2017-09-18 DIAGNOSIS — M9905 Segmental and somatic dysfunction of pelvic region: Secondary | ICD-10-CM | POA: Diagnosis not present

## 2017-09-20 DIAGNOSIS — M5136 Other intervertebral disc degeneration, lumbar region: Secondary | ICD-10-CM | POA: Diagnosis not present

## 2017-09-20 DIAGNOSIS — M9903 Segmental and somatic dysfunction of lumbar region: Secondary | ICD-10-CM | POA: Diagnosis not present

## 2017-09-20 DIAGNOSIS — M9905 Segmental and somatic dysfunction of pelvic region: Secondary | ICD-10-CM | POA: Diagnosis not present

## 2017-09-20 DIAGNOSIS — M5416 Radiculopathy, lumbar region: Secondary | ICD-10-CM | POA: Diagnosis not present

## 2017-09-21 DIAGNOSIS — M9905 Segmental and somatic dysfunction of pelvic region: Secondary | ICD-10-CM | POA: Diagnosis not present

## 2017-09-21 DIAGNOSIS — M5136 Other intervertebral disc degeneration, lumbar region: Secondary | ICD-10-CM | POA: Diagnosis not present

## 2017-09-21 DIAGNOSIS — M5416 Radiculopathy, lumbar region: Secondary | ICD-10-CM | POA: Diagnosis not present

## 2017-09-21 DIAGNOSIS — M9903 Segmental and somatic dysfunction of lumbar region: Secondary | ICD-10-CM | POA: Diagnosis not present

## 2017-09-26 DIAGNOSIS — M9905 Segmental and somatic dysfunction of pelvic region: Secondary | ICD-10-CM | POA: Diagnosis not present

## 2017-09-26 DIAGNOSIS — M5416 Radiculopathy, lumbar region: Secondary | ICD-10-CM | POA: Diagnosis not present

## 2017-09-26 DIAGNOSIS — M9903 Segmental and somatic dysfunction of lumbar region: Secondary | ICD-10-CM | POA: Diagnosis not present

## 2017-09-26 DIAGNOSIS — M5136 Other intervertebral disc degeneration, lumbar region: Secondary | ICD-10-CM | POA: Diagnosis not present

## 2017-09-28 DIAGNOSIS — M9905 Segmental and somatic dysfunction of pelvic region: Secondary | ICD-10-CM | POA: Diagnosis not present

## 2017-09-28 DIAGNOSIS — M5136 Other intervertebral disc degeneration, lumbar region: Secondary | ICD-10-CM | POA: Diagnosis not present

## 2017-09-28 DIAGNOSIS — M9903 Segmental and somatic dysfunction of lumbar region: Secondary | ICD-10-CM | POA: Diagnosis not present

## 2017-09-28 DIAGNOSIS — M5416 Radiculopathy, lumbar region: Secondary | ICD-10-CM | POA: Diagnosis not present

## 2017-10-04 DIAGNOSIS — M9905 Segmental and somatic dysfunction of pelvic region: Secondary | ICD-10-CM | POA: Diagnosis not present

## 2017-10-04 DIAGNOSIS — M5136 Other intervertebral disc degeneration, lumbar region: Secondary | ICD-10-CM | POA: Diagnosis not present

## 2017-10-04 DIAGNOSIS — M9903 Segmental and somatic dysfunction of lumbar region: Secondary | ICD-10-CM | POA: Diagnosis not present

## 2017-10-04 DIAGNOSIS — M5416 Radiculopathy, lumbar region: Secondary | ICD-10-CM | POA: Diagnosis not present

## 2017-10-10 ENCOUNTER — Other Ambulatory Visit: Payer: Self-pay | Admitting: Family Medicine

## 2017-10-11 DIAGNOSIS — M9905 Segmental and somatic dysfunction of pelvic region: Secondary | ICD-10-CM | POA: Diagnosis not present

## 2017-10-11 DIAGNOSIS — M5136 Other intervertebral disc degeneration, lumbar region: Secondary | ICD-10-CM | POA: Diagnosis not present

## 2017-10-11 DIAGNOSIS — M5416 Radiculopathy, lumbar region: Secondary | ICD-10-CM | POA: Diagnosis not present

## 2017-10-11 DIAGNOSIS — M9903 Segmental and somatic dysfunction of lumbar region: Secondary | ICD-10-CM | POA: Diagnosis not present

## 2017-10-18 DIAGNOSIS — M9905 Segmental and somatic dysfunction of pelvic region: Secondary | ICD-10-CM | POA: Diagnosis not present

## 2017-10-18 DIAGNOSIS — M5416 Radiculopathy, lumbar region: Secondary | ICD-10-CM | POA: Diagnosis not present

## 2017-10-18 DIAGNOSIS — M9903 Segmental and somatic dysfunction of lumbar region: Secondary | ICD-10-CM | POA: Diagnosis not present

## 2017-10-18 DIAGNOSIS — M5136 Other intervertebral disc degeneration, lumbar region: Secondary | ICD-10-CM | POA: Diagnosis not present

## 2017-10-25 DIAGNOSIS — M5136 Other intervertebral disc degeneration, lumbar region: Secondary | ICD-10-CM | POA: Diagnosis not present

## 2017-10-25 DIAGNOSIS — M9905 Segmental and somatic dysfunction of pelvic region: Secondary | ICD-10-CM | POA: Diagnosis not present

## 2017-10-25 DIAGNOSIS — M9903 Segmental and somatic dysfunction of lumbar region: Secondary | ICD-10-CM | POA: Diagnosis not present

## 2017-10-25 DIAGNOSIS — M5416 Radiculopathy, lumbar region: Secondary | ICD-10-CM | POA: Diagnosis not present

## 2017-10-27 ENCOUNTER — Other Ambulatory Visit: Payer: Self-pay | Admitting: Family Medicine

## 2017-10-27 NOTE — Telephone Encounter (Signed)
Last office visit 10/2016 Last lipid panel 10/2016 - unable to refill Atorvastatin per protocol Request refill on Atorvastatin; last refill 07/20/17; #90; RF x1 Request refill on Clopidogrel; last refill 10/07/16; # 90; RF x1

## 2017-11-01 DIAGNOSIS — M5416 Radiculopathy, lumbar region: Secondary | ICD-10-CM | POA: Diagnosis not present

## 2017-11-01 DIAGNOSIS — M9903 Segmental and somatic dysfunction of lumbar region: Secondary | ICD-10-CM | POA: Diagnosis not present

## 2017-11-01 DIAGNOSIS — M5136 Other intervertebral disc degeneration, lumbar region: Secondary | ICD-10-CM | POA: Diagnosis not present

## 2017-11-01 DIAGNOSIS — M9905 Segmental and somatic dysfunction of pelvic region: Secondary | ICD-10-CM | POA: Diagnosis not present

## 2017-11-08 DIAGNOSIS — M5136 Other intervertebral disc degeneration, lumbar region: Secondary | ICD-10-CM | POA: Diagnosis not present

## 2017-11-08 DIAGNOSIS — M9905 Segmental and somatic dysfunction of pelvic region: Secondary | ICD-10-CM | POA: Diagnosis not present

## 2017-11-08 DIAGNOSIS — M9903 Segmental and somatic dysfunction of lumbar region: Secondary | ICD-10-CM | POA: Diagnosis not present

## 2017-11-08 DIAGNOSIS — M5416 Radiculopathy, lumbar region: Secondary | ICD-10-CM | POA: Diagnosis not present

## 2017-11-09 ENCOUNTER — Ambulatory Visit: Payer: Medicare Other

## 2017-11-09 DIAGNOSIS — R162 Hepatomegaly with splenomegaly, not elsewhere classified: Secondary | ICD-10-CM | POA: Diagnosis not present

## 2017-11-09 DIAGNOSIS — D471 Chronic myeloproliferative disease: Secondary | ICD-10-CM | POA: Diagnosis not present

## 2017-11-09 DIAGNOSIS — D630 Anemia in neoplastic disease: Secondary | ICD-10-CM | POA: Diagnosis not present

## 2017-11-15 DIAGNOSIS — M5416 Radiculopathy, lumbar region: Secondary | ICD-10-CM | POA: Diagnosis not present

## 2017-11-15 DIAGNOSIS — M9903 Segmental and somatic dysfunction of lumbar region: Secondary | ICD-10-CM | POA: Diagnosis not present

## 2017-11-15 DIAGNOSIS — M5136 Other intervertebral disc degeneration, lumbar region: Secondary | ICD-10-CM | POA: Diagnosis not present

## 2017-11-15 DIAGNOSIS — M9905 Segmental and somatic dysfunction of pelvic region: Secondary | ICD-10-CM | POA: Diagnosis not present

## 2017-11-22 DIAGNOSIS — M5416 Radiculopathy, lumbar region: Secondary | ICD-10-CM | POA: Diagnosis not present

## 2017-11-22 DIAGNOSIS — M9905 Segmental and somatic dysfunction of pelvic region: Secondary | ICD-10-CM | POA: Diagnosis not present

## 2017-11-22 DIAGNOSIS — M9903 Segmental and somatic dysfunction of lumbar region: Secondary | ICD-10-CM | POA: Diagnosis not present

## 2017-11-22 DIAGNOSIS — M5136 Other intervertebral disc degeneration, lumbar region: Secondary | ICD-10-CM | POA: Diagnosis not present

## 2017-11-24 ENCOUNTER — Encounter: Payer: Self-pay | Admitting: Physician Assistant

## 2017-11-24 ENCOUNTER — Other Ambulatory Visit: Payer: Self-pay | Admitting: Family Medicine

## 2017-11-24 ENCOUNTER — Ambulatory Visit (INDEPENDENT_AMBULATORY_CARE_PROVIDER_SITE_OTHER): Payer: Medicare Other | Admitting: Physician Assistant

## 2017-11-24 VITALS — BP 125/66 | HR 73 | Temp 98.5°F | Wt 190.3 lb

## 2017-11-24 DIAGNOSIS — J0110 Acute frontal sinusitis, unspecified: Secondary | ICD-10-CM | POA: Diagnosis not present

## 2017-11-24 MED ORDER — BENZONATATE 100 MG PO CAPS: 100.0000 mg | ORAL_CAPSULE | Freq: Three times a day (TID) | ORAL | 0 refills | Status: AC | PRN

## 2017-11-24 MED ORDER — AMOXICILLIN 875 MG PO TABS: 875.0000 mg | ORAL_TABLET | Freq: Two times a day (BID) | ORAL | 0 refills | Status: AC

## 2017-11-24 NOTE — Progress Notes (Signed)
Subjective:    Patient ID: [name], male    DOB: [birth year], 77 y.o.   MRN: [id number]  [name] is a 77 y.o. male presenting on 11/24/2017 for Cough (pt states he has had a cough and chest congestion for 2 weeks, has tried multiple PTC medications with little relief)   HPI   Cough and congestion, started out as cold feeling. Got better and then got worse. No fever now but continued sinus congestion.   Social History   Tobacco Use  . Smoking status: Former Smoker    Packs/day: 0.25    Types: Cigarettes    Last attempt to quit: 06/06/2010    Years since quitting: 7.5  . Smokeless tobacco: Never Used  . Tobacco comment: quit 20 years  Substance Use Topics  . Alcohol use: No    Alcohol/week: 0.0 oz  . Drug use: No    Review of Systems Per HPI unless specifically indicated above     Objective:    BP 125/66   Pulse 73   Temp 98.5 F (36.9 C) (Oral)   Wt 190 lb 4.8 oz (86.3 kg)   SpO2 97%   BMI 28.10 kg/m   Wt Readings from Last 3 Encounters:  11/24/17 190 lb 4.8 oz (86.3 kg)  07/05/17 193 lb 2 oz (87.6 kg)  02/09/17 187 lb 8 oz (85 kg)    Physical Exam  Constitutional: He is oriented to person, place, and time. He appears well-developed and well-nourished. No distress.  HENT:  Right Ear: External ear normal.  Left Ear: External ear normal.  Nose: Right sinus exhibits maxillary sinus tenderness and frontal sinus tenderness. Left sinus exhibits maxillary sinus tenderness and frontal sinus tenderness.  Mouth/Throat: Oropharynx is clear and moist and mucous membranes are normal. No oropharyngeal exudate, posterior oropharyngeal edema, posterior oropharyngeal erythema or tonsillar abscesses.  Eyes: Conjunctivae are normal.  Neck: Normal range of motion.  Cardiovascular: Normal rate and regular rhythm.  Pulmonary/Chest: Effort normal and breath sounds normal.  Neurological: He is alert and oriented to person, place, and time.  Skin: Skin is warm and dry. He is  not diaphoretic.  Psychiatric: He has a normal mood and affect. His behavior is normal.   Results for orders placed or performed in visit on 07/05/17  Lactate dehydrogenase  Result Value Ref Range   LDH 434 (H) 98 - 192 U/L  Comprehensive metabolic panel  Result Value Ref Range   Sodium 138 135 - 145 mmol/L   Potassium 4.6 3.5 - 5.1 mmol/L   Chloride 107 101 - 111 mmol/L   CO2 24 22 - 32 mmol/L   Glucose, Bld 96 65 - 99 mg/dL   BUN 22 (H) 6 - 20 mg/dL   Creatinine, Ser 1.03 0.61 - 1.24 mg/dL   Calcium 8.5 (L) 8.9 - 10.3 mg/dL   Total Protein 6.9 6.5 - 8.1 g/dL   Albumin 4.0 3.5 - 5.0 g/dL   AST 27 15 - 41 U/L   ALT 26 17 - 63 U/L   Alkaline Phosphatase 104 38 - 126 U/L   Total Bilirubin 1.0 0.3 - 1.2 mg/dL   GFR calc non Af Amer >60 >60 mL/min   GFR calc Af Amer >60 >60 mL/min   Anion gap 7 5 - 15  CBC with Differential/Platelet  Result Value Ref Range   WBC 2.8 (L) 3.8 - 10.6 K/uL   RBC 3.37 (L) 4.40 - 5.90 MIL/uL   Hemoglobin 10.5 (L)  13.0 - 18.0 g/dL   HCT 30.3 (L) 40.0 - 52.0 %   MCV 90.0 80.0 - 100.0 fL   MCH 31.2 26.0 - 34.0 pg   MCHC 34.7 32.0 - 36.0 g/dL   RDW 16.9 (H) 11.5 - 14.5 %   Platelets 234 150 - 440 K/uL   Neutrophils Relative % 62 %   Neutro Abs 1.8 1.4 - 6.5 K/uL   Lymphocytes Relative 23 %   Lymphs Abs 0.6 (L) 1.0 - 3.6 K/uL   Monocytes Relative 14 %   Monocytes Absolute 0.4 0.2 - 1.0 K/uL   Eosinophils Relative 1 %   Eosinophils Absolute 0.0 0 - 0.7 K/uL   Basophils Relative 0 %   Basophils Absolute 0.0 0 - 0.1 K/uL      Assessment & Plan:   1. Acute non-recurrent frontal sinusitis  - amoxicillin (AMOXIL) 875 MG tablet; Take 1 tablet (875 mg total) by mouth 2 (two) times daily for 10 days.  Dispense: 20 tablet; Refill: 0 - benzonatate (TESSALON PERLES) 100 MG capsule; Take 1 capsule (100 mg total) by mouth 3 (three) times daily as needed for up to 7 days for cough.  Dispense: 21 capsule; Refill: 0    Follow up plan: Return if symptoms  worsen or fail to improve.  Carles Collet, PA-C [facility] 12/06/2017, 1:58 PM

## 2017-11-24 NOTE — Patient Instructions (Signed)

## 2017-12-06 DIAGNOSIS — M5416 Radiculopathy, lumbar region: Secondary | ICD-10-CM | POA: Diagnosis not present

## 2017-12-06 DIAGNOSIS — M5136 Other intervertebral disc degeneration, lumbar region: Secondary | ICD-10-CM | POA: Diagnosis not present

## 2017-12-06 DIAGNOSIS — M9905 Segmental and somatic dysfunction of pelvic region: Secondary | ICD-10-CM | POA: Diagnosis not present

## 2017-12-06 DIAGNOSIS — M9903 Segmental and somatic dysfunction of lumbar region: Secondary | ICD-10-CM | POA: Diagnosis not present

## 2017-12-18 ENCOUNTER — Telehealth: Payer: Self-pay | Admitting: Urology

## 2017-12-18 DIAGNOSIS — N281 Cyst of kidney, acquired: Secondary | ICD-10-CM

## 2017-12-18 NOTE — Telephone Encounter (Signed)
Patient needs a new order for a RUS please and thank you.   Sharyn Lull

## 2017-12-18 NOTE — Telephone Encounter (Signed)
Done

## 2017-12-20 ENCOUNTER — Ambulatory Visit
Admission: RE | Admit: 2017-12-20 | Discharge: 2017-12-20 | Disposition: A | Discharge status: Home / Self Care | Payer: Medicare Other | Source: Ambulatory Visit | Attending: Urology | Admitting: Urology

## 2017-12-20 DIAGNOSIS — M5136 Other intervertebral disc degeneration, lumbar region: Secondary | ICD-10-CM | POA: Diagnosis not present

## 2017-12-20 DIAGNOSIS — N281 Cyst of kidney, acquired: Secondary | ICD-10-CM | POA: Insufficient documentation

## 2017-12-20 DIAGNOSIS — M9903 Segmental and somatic dysfunction of lumbar region: Secondary | ICD-10-CM | POA: Diagnosis not present

## 2017-12-20 DIAGNOSIS — M5416 Radiculopathy, lumbar region: Secondary | ICD-10-CM | POA: Diagnosis not present

## 2017-12-20 DIAGNOSIS — R161 Splenomegaly, not elsewhere classified: Secondary | ICD-10-CM | POA: Insufficient documentation

## 2017-12-20 DIAGNOSIS — M9905 Segmental and somatic dysfunction of pelvic region: Secondary | ICD-10-CM | POA: Diagnosis not present

## 2017-12-21 ENCOUNTER — Ambulatory Visit (INDEPENDENT_AMBULATORY_CARE_PROVIDER_SITE_OTHER): Payer: Medicare Other | Admitting: Urology

## 2017-12-21 ENCOUNTER — Encounter: Payer: Self-pay | Admitting: Urology

## 2017-12-21 DIAGNOSIS — N281 Cyst of kidney, acquired: Secondary | ICD-10-CM | POA: Diagnosis not present

## 2017-12-22 ENCOUNTER — Encounter: Payer: Self-pay | Admitting: Urology

## 2017-12-22 NOTE — Progress Notes (Signed)
12/21/2017 12:29 PM   [name] 1940-06-14 [id number]  Referring provider: [name], DO [place], [place] [address]  Chief Complaint  Patient presents with  . Renal Cyst    HPI: 77 year old male with bilateral renal cysts and a 12 cm Bosniak 2 right lower pole cyst with septation and peripheral calcification.  These have been noted back as far as 2011.  Recent imaging has been stable.  He presents for review a follow-up renal ultrasound.  He has no complaints and denies flank/abdominal/pelvic pain.  He has no bothersome lower urinary tract symptoms.  Denies gross hematuria.    Renal ultrasound performed on 12/20/2017 shows stable, bilateral renal cysts.  PMH: Past Medical History:  Diagnosis Date  . Arthritis   . Benign hypertensive renal disease   . GERD (gastroesophageal reflux disease)   . Heartburn   . History of retinal detachment   . Hyperlipidemia   . Hypertension   . Melanoma ([place])    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 approximately 10-15 years ago  . Myelofibrosis ([place])   . Prostate hypertrophy   . Stroke [facility]) June 2009   R brain subcortical infarct    Surgical History: Past Surgical History:  Procedure Laterality Date  . ASPIRATION / INJECTION RENAL CYST  Feb 2017  . BACK SURGERY     approx 20- 25 years ago  . GAS INSERTION  08/11/2011   Procedure: INSERTION OF GAS;  Surgeon: Hayden Pedro, MD;  Location: [place];  Service: Ophthalmology;  Laterality: Right;  C3F8  . SCLERAL BUCKLE  08/11/2011   Procedure: SCLERAL BUCKLE;  Surgeon: Hayden Pedro, MD;  Location: [place];  Service: Ophthalmology;  Laterality: Right;  Marland Kitchen VARICOSE VEIN SURGERY      Home Medications:  Allergies as of 12/21/2017      Reactions   Meloxicam Nausea And Vomiting      Medication List        Accurate as of 12/21/17 11:59 PM. Always use your most recent med list.          ALEVE 220 MG Caps Generic drug:  Naproxen Sodium Take by mouth.   atorvastatin 10  MG tablet Commonly known as:  LIPITOR TAKE ONE TABLET BY MOUTH AT BEDTIME   chlorpheniramine 4 MG tablet Commonly known as:  CHLOR-TRIMETON Take 4 mg by mouth 2 (two) times daily as needed for allergies.   clopidogrel 75 MG tablet Commonly known as:  PLAVIX TAKE ONE TABLET BY MOUTH DAILY   diclofenac sodium 1 % Gel Commonly known as:  VOLTAREN Apply 4 g topically 4 (four) times daily as needed (for neck pain).   gabapentin 300 MG capsule Commonly known as:  NEURONTIN Take 1 capsule (300 mg total) by mouth 3 (three) times daily.   losartan 100 MG tablet Commonly known as:  COZAAR TAKE ONE TABLET BY MOUTH DAILY   meclizine 25 MG tablet Commonly known as:  ANTIVERT TAKE ONE TABLET 3 TIMES DAILY AS NEEDED FOR DIZZINESS   MULTI GINSENG & SAW PALMETTO 500 MG Caps Take 500 mg by mouth daily.   multivitamin with minerals Tabs tablet Take 1 tablet by mouth daily.   ranitidine 150 MG tablet Commonly known as:  ZANTAC Take 150 mg by mouth 2 (two) times daily.   tamsulosin 0.4 MG Caps capsule Commonly known as:  FLOMAX TAKE 1 CAPSULE BY MOUTH DAILY   terbinafine 250 MG tablet Commonly known as:  LAMISIL Take  1 tablet (250 mg total) by mouth daily.       Allergies:  Allergies  Allergen Reactions  . Meloxicam Nausea And Vomiting    Family History: Family History  Problem Relation Age of Onset  . Heart disease Father   . Diabetes Son   . Kidney disease Neg Hx   . Prostate cancer Neg Hx     Social History:  reports that he quit smoking about 7 years ago. His smoking use included cigarettes. He smoked 0.25 packs per day. He has never used smokeless tobacco. He reports that he does not drink alcohol or use drugs.  ROS: UROLOGY Frequent Urination?: No Hard to postpone urination?: No Burning/pain with urination?: No Get up at night to urinate?: No Leakage of urine?: No Urine stream starts and stops?: No Trouble starting stream?: No Do you have to strain to  urinate?: No Blood in urine?: No Urinary tract infection?: No Sexually transmitted disease?: No Injury to kidneys or bladder?: No Painful intercourse?: No Weak stream?: No Erection problems?: No Penile pain?: No  Gastrointestinal Nausea?: No Vomiting?: No Indigestion/heartburn?: No Diarrhea?: No Constipation?: No  Constitutional Fever: No Night sweats?: No Weight loss?: No Fatigue?: No  Skin Skin rash/lesions?: No Itching?: No  Eyes Blurred vision?: No Double vision?: No  Ears/Nose/Throat Sore throat?: No Sinus problems?: No  Hematologic/Lymphatic Swollen glands?: No Easy bruising?: No  Cardiovascular Leg swelling?: No Chest pain?: No  Respiratory Cough?: No Shortness of breath?: No  Endocrine Excessive thirst?: No  Musculoskeletal Back pain?: No Joint pain?: No  Neurological Headaches?: No Dizziness?: No  Psychologic Depression?: No Anxiety?: No  Physical Exam: There were no vitals taken for this visit.  Constitutional:  Alert and oriented, No acute distress. HEENT: [place] AT, moist mucus membranes.  Trachea midline, no masses. Cardiovascular: No clubbing, cyanosis, or edema. Respiratory: Normal respiratory effort, no increased work of breathing. GI: Abdomen is soft, nontender, nondistended, no abdominal masses GU: No CVA tenderness Lymph: No cervical or inguinal lymphadenopathy. Skin: No rashes, bruises or suspicious lesions. Neurologic: Grossly intact, no focal deficits, moving all 4 extremities. Psychiatric: Normal mood and affect.  Pertinent Imaging: Images were personally reviewed  Results for orders placed during the hospital encounter of 12/20/17  Ultrasound renal complete   Narrative CLINICAL DATA:  Renal cysts.  EXAM: RENAL / URINARY TRACT ULTRASOUND COMPLETE  COMPARISON:  CT abdomen dated November 14, 2016.  FINDINGS: Right Kidney:  Length: 12.2 cm. Echogenicity within normal limits. No mass or hydronephrosis visualized.  Two large right renal cysts are again noted, measuring up to 11.9 cm.  Left Kidney:  Length: 13.1 cm. Echogenicity within normal limits. No mass or hydronephrosis visualized. Multiple simple appearing cysts are again noted measuring up to 5.0 cm.  Bladder:  Appears normal for degree of bladder distention.  Incidentally noted splenomegaly, unchanged.  IMPRESSION: 1. Bilateral renal cysts, grossly unchanged when compared to prior CT. 2. Splenomegaly, also unchanged.   Electronically Signed   By: Titus Dubin M.D.   On: 12/20/2017 16:18     Assessment & Plan:   77 year old male with stable bilateral renal cysts.  Recommend a follow-up renal ultrasound in 2 years.   Abbie Sons, [place] [facility] [address], [place], [place] [address] [phone]

## 2018-01-03 ENCOUNTER — Inpatient Hospital Stay: Payer: Medicare Other | Attending: Hematology and Oncology | Admitting: Hematology and Oncology

## 2018-01-03 ENCOUNTER — Encounter: Payer: Self-pay | Admitting: Hematology and Oncology

## 2018-01-03 ENCOUNTER — Inpatient Hospital Stay: Payer: Medicare Other

## 2018-01-03 VITALS — BP 105/68 | HR 68 | Temp 98.2°F | Resp 18 | Wt 187.2 lb

## 2018-01-03 DIAGNOSIS — D471 Chronic myeloproliferative disease: Secondary | ICD-10-CM

## 2018-01-03 DIAGNOSIS — D7581 Myelofibrosis: Secondary | ICD-10-CM

## 2018-01-03 DIAGNOSIS — D72818 Other decreased white blood cell count: Secondary | ICD-10-CM

## 2018-01-03 LAB — CBC WITH DIFFERENTIAL/PLATELET
Basophils Absolute: 0 10*3/uL (ref 0–0.1)
Basophils Relative: 1 %
Eosinophils Absolute: 0 10*3/uL (ref 0–0.7)
Eosinophils Relative: 0 %
HCT: 29.4 % — ABNORMAL LOW (ref 40.0–52.0)
Hemoglobin: 9.8 g/dL — ABNORMAL LOW (ref 13.0–18.0)
Lymphocytes Relative: 27 %
Lymphs Abs: 0.6 10*3/uL — ABNORMAL LOW (ref 1.0–3.6)
MCH: 30.9 pg (ref 26.0–34.0)
MCHC: 33.4 g/dL (ref 32.0–36.0)
MCV: 92.5 fL (ref 80.0–100.0)
Monocytes Absolute: 0.3 10*3/uL (ref 0.2–1.0)
Monocytes Relative: 14 %
Neutro Abs: 1.3 10*3/uL — ABNORMAL LOW (ref 1.4–6.5)
Neutrophils Relative %: 58 %
Platelets: 239 10*3/uL (ref 150–440)
RBC: 3.18 MIL/uL — ABNORMAL LOW (ref 4.40–5.90)
RDW: 17.8 % — ABNORMAL HIGH (ref 11.5–14.5)
WBC: 2.2 10*3/uL — ABNORMAL LOW (ref 3.8–10.6)

## 2018-01-03 LAB — COMPREHENSIVE METABOLIC PANEL
ALT: 25 U/L (ref 0–44)
AST: 30 U/L (ref 15–41)
Albumin: 4.1 g/dL (ref 3.5–5.0)
Alkaline Phosphatase: 88 U/L (ref 38–126)
Anion gap: 9 (ref 5–15)
BUN: 32 mg/dL — ABNORMAL HIGH (ref 8–23)
CO2: 23 mmol/L (ref 22–32)
Calcium: 8.9 mg/dL (ref 8.9–10.3)
Chloride: 109 mmol/L (ref 98–111)
Creatinine, Ser: 1.24 mg/dL (ref 0.61–1.24)
GFR calc Af Amer: >60 mL/min (ref >60)
GFR calc non Af Amer: 54 mL/min — ABNORMAL LOW (ref >60)
Glucose, Bld: 142 mg/dL — ABNORMAL HIGH (ref 70–99)
Potassium: 4.5 mmol/L (ref 3.5–5.1)
Sodium: 141 mmol/L (ref 135–145)
Total Bilirubin: 1.2 mg/dL (ref 0.3–1.2)
Total Protein: 6.5 g/dL (ref 6.5–8.1)

## 2018-01-03 LAB — LACTATE DEHYDROGENASE: LDH: 407 U/L — ABNORMAL HIGH (ref 98–192)

## 2018-01-03 NOTE — Progress Notes (Signed)
[facility] day:  01/03/18  Chief Complaint: [name] is a 77 y.o. male  with myelofibrosis who is seen for 6 month assessment.  HPI: The patient was last seen in the medical oncology clinic on 07/05/2017.  At that time, he felt good.  Exam revealed a stable palpable spleen tip.  Counts were stable.   He saw Dr.Murat Annabelle Harman at [facility] on 11/09/2017.  He felt well.  He denied B symptoms.  He denied abdominal pain and pruritus.  He denied bruising or bleeding.  Spleen was stable at 2 cm below th left costal margin.  Hemoglobin was 10.1, WBC 2600, and platelets 246,000.  A rare blast was present.  No intervention was needed.  During the interim, he has felt fine.  He is working every day.  He is building houses and inspecting houses.  Weight is down 6 pounds secondary to activity level.  He denies any concerns.   Past Medical History:  Diagnosis Date  . Arthritis   . Benign hypertensive renal disease   . GERD (gastroesophageal reflux disease)   . Heartburn   . History of retinal detachment   . Hyperlipidemia   . Hypertension   . Melanoma ([place])    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 approximately 10-15 years ago  . Myelofibrosis ([place])   . Prostate hypertrophy   . Stroke [facility]) June 2009   R brain subcortical infarct    Past Surgical History:  Procedure Laterality Date  . ASPIRATION / INJECTION RENAL CYST  Feb 2017  . BACK SURGERY     approx 20- 25 years ago  . GAS INSERTION  08/11/2011   Procedure: INSERTION OF GAS;  Surgeon: Hayden Pedro, MD;  Location: [place];  Service: Ophthalmology;  Laterality: Right;  C3F8  . SCLERAL BUCKLE  08/11/2011   Procedure: SCLERAL BUCKLE;  Surgeon: Hayden Pedro, MD;  Location: [place];  Service: Ophthalmology;  Laterality: Right;  Marland Kitchen VARICOSE VEIN SURGERY      Family History  Problem Relation Age of Onset  . Heart disease Father   . Diabetes Son   . Kidney disease Neg Hx   . Prostate cancer Neg  Hx     Social History:  reports that he quit smoking about 7 years ago. His smoking use included cigarettes. He smoked 0.25 packs per day. He has never used smokeless tobacco. He reports that he does not drink alcohol or use drugs.  He works full-time as a Animator. He lives in [place].  He is alone today.  Allergies:  Allergies  Allergen Reactions  . Meloxicam Nausea And Vomiting    Current Medications: Current Outpatient Medications  Medication Sig Dispense Refill  . atorvastatin (LIPITOR) 10 MG tablet TAKE ONE TABLET BY MOUTH AT BEDTIME (Patient not taking: Reported on 01/24/2018) 90 tablet 1  . chlorpheniramine (CHLOR-TRIMETON) 4 MG tablet Take 4 mg by mouth 2 (two) times daily as needed for allergies.    Marland Kitchen clopidogrel (PLAVIX) 75 MG tablet TAKE ONE TABLET BY MOUTH DAILY 90 tablet 1  . diclofenac sodium (VOLTAREN) 1 % GEL Apply 4 g topically 4 (four) times daily as needed (for neck pain). 100 g 6  . gabapentin (NEURONTIN) 300 MG capsule Take 1 capsule (300 mg total) by mouth 3 (three) times daily. 270 capsule 3  . Ginsengs-Saw Palmetto (MULTI GINSENG & SAW PALMETTO) 500 MG CAPS Take 500 mg by mouth daily.    Marland Kitchen  losartan (COZAAR) 100 MG tablet TAKE ONE TABLET BY MOUTH DAILY 90 tablet 1  . Multiple Vitamin (MULITIVITAMIN WITH MINERALS) TABS Take 1 tablet by mouth daily.    . Naproxen Sodium (ALEVE) 220 MG CAPS Take by mouth.    . ranitidine (ZANTAC) 150 MG tablet Take 150 mg by mouth 2 (two) times daily.    . tamsulosin (FLOMAX) 0.4 MG CAPS capsule TAKE 1 CAPSULE BY MOUTH DAILY 90 capsule 3  . terbinafine (LAMISIL) 250 MG tablet Take 1 tablet (250 mg total) by mouth daily. (Patient not taking: Reported on 01/24/2018) 90 tablet 0  . Loratadine 10 MG CAPS Take by mouth.    . meclizine (ANTIVERT) 25 MG tablet TAKE 1 TABLET BY MOUTH 3 TIMES DAILY SA NEEDED FOR DIZZINESS 90 tablet 0  . simvastatin (ZOCOR) 40 MG tablet simvastatin 40 mg tablet     No current facility-administered  medications for this visit.     Review of Systems:  GENERAL:  Feels good.  Active.  No fevers, sweats.  Weight loss 6 pounds secondary to activity. PERFORMANCE STATUS (ECOG):  0 HEENT:  No visual changes, runny nose, sore throat, mouth sores or tenderness. Lungs: No shortness of breath or cough.  No hemoptysis. Cardiac:  No chest pain, palpitations, orthopnea, or PND. GI:  No nausea, vomiting, diarrhea, constipation, melena or hematochezia. GU:  No urgency, frequency, dysuria, or hematuria. Musculoskeletal:  No back pain.  No joint pain.  No muscle tenderness. Extremities:  No pain or swelling. Skin:  No rashes or skin changes. Neuro:  No headache, numbness or weakness, balance or coordination issues. Endocrine:  No diabetes, thyroid issues, hot flashes or night sweats. Psych:  No mood changes, depression or anxiety. Pain:  No focal pain. Review of systems:  All other systems reviewed and found to be negative.   Physical Exam: Blood pressure 105/68, pulse 68, temperature 98.2 F (36.8 C), temperature source Tympanic, resp. rate 18, weight 187 lb 2.7 oz (84.9 kg). GENERAL:  Well developed, well nourished, gentleman sitting comfortably in the exam room in no acute distress. MENTAL STATUS:  Alert and oriented to person, place and time. HEAD:  Short gray hair.  Normocephalic, atraumatic, face symmetric, no Cushingoid features. EYES:  Blue eyes.  Pupils equal round and reactive to light and accomodation.  No conjunctivitis or scleral icterus. ENT:  Oropharynx clear without lesion.  Tongue normal. Mucous membranes moist.  RESPIRATORY:  Clear to auscultation without rales, wheezes or rhonchi. CARDIOVASCULAR:  Regular rate and rhythm without murmur, rub or gallop. ABDOMEN:  Soft, non-tender, with active bowel sounds, and no hepatomegaly.  Spleen palpable 2 FB below LCM.  No masses. SKIN:  No rashes, ulcers or lesions. EXTREMITIES: No edema, no skin discoloration or tenderness.  No palpable  cords. LYMPH NODES: No palpable cervical, supraclavicular, axillary or inguinal adenopathy  NEUROLOGICAL: Unremarkable. PSYCH:  Appropriate.    Appointment on 01/03/2018  Component Date Value Ref Range Status  . LDH 01/03/2018 407* 98 - 192 U/L Final   Performed at [facility], [address] [place],  [address]  . Sodium 01/03/2018 141  135 - 145 mmol/L Final  . Potassium 01/03/2018 4.5  3.5 - 5.1 mmol/L Final  . Chloride 01/03/2018 109  98 - 111 mmol/L Final  . CO2 01/03/2018 23  22 - 32 mmol/L Final  . Glucose, Bld 01/03/2018 142* 70 - 99 mg/dL Final  . BUN 01/03/2018 32* 8 - 23 mg/dL Final  . Creatinine, Ser  01/03/2018 1.24  0.61 - 1.24 mg/dL Final  . Calcium 01/03/2018 8.9  8.9 - 10.3 mg/dL Final  . Total Protein 01/03/2018 6.5  6.5 - 8.1 g/dL Final  . Albumin 01/03/2018 4.1  3.5 - 5.0 g/dL Final  . AST 01/03/2018 30  15 - 41 U/L Final  . ALT 01/03/2018 25  0 - 44 U/L Final  . Alkaline Phosphatase 01/03/2018 88  38 - 126 U/L Final  . Total Bilirubin 01/03/2018 1.2  0.3 - 1.2 mg/dL Final  . GFR calc non Af Amer 01/03/2018 54* >60 mL/min Final  . GFR calc Af Amer 01/03/2018 >60  >60 mL/min Final   Comment: (NOTE) The eGFR has been calculated using the CKD EPI equation. This calculation has not been validated in all clinical situations. eGFR's persistently <60 mL/min signify possible Chronic Kidney Disease.   Georgiann Hahn gap 01/03/2018 9  5 - 15 Final   Performed at [facility], [address] [place], [place] [address]  . WBC 01/03/2018 2.2* 3.8 - 10.6 K/uL Final  . RBC 01/03/2018 3.18* 4.40 - 5.90 MIL/uL Final  . Hemoglobin 01/03/2018 9.8* 13.0 - 18.0 g/dL Final  . HCT 01/03/2018 29.4* 40.0 - 52.0 % Final  . MCV 01/03/2018 92.5  80.0 - 100.0 fL Final  . MCH 01/03/2018 30.9  26.0 - 34.0 pg Final  . MCHC 01/03/2018 33.4  32.0 - 36.0 g/dL Final  . RDW 01/03/2018 17.8* 11.5 - 14.5 % Final  . Platelets 01/03/2018 239  150 - 440 K/uL Final  .  Neutrophils Relative % 01/03/2018 58  % Final  . Neutro Abs 01/03/2018 1.3* 1.4 - 6.5 K/uL Final  . Lymphocytes Relative 01/03/2018 27  % Final  . Lymphs Abs 01/03/2018 0.6* 1.0 - 3.6 K/uL Final  . Monocytes Relative 01/03/2018 14  % Final  . Monocytes Absolute 01/03/2018 0.3  0.2 - 1.0 K/uL Final  . Eosinophils Relative 01/03/2018 0  % Final  . Eosinophils Absolute 01/03/2018 0.0  0 - 0.7 K/uL Final  . Basophils Relative 01/03/2018 1  % Final  . 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  He presented with a long history of normocytic anemia unresponsive to oral iron.  He has mild leukopenia.  He has mild splenomegaly (15.8 cm) by abdominal ultrasound on 06/04/2015.  DIPPS score is 71 (age 39- 1, hemoglobin less than 10- 2) and score of 4 if 1% circulating blasts included from 07/17/2014.  Bone marrow on 06/11/2014 was most consistent with primary myelofibrosis.  Bone marrow biopsy showed 1% abnormal cells: CD45+, CD5+, CD10, CD11c+/-, CD19+, CD2-+, (dim), CD22+ (dim, CD23+, CD38-/+, FMC7-, HLA-DR+, sig lambda+(dim).  Blasts were not increased 1.2%; hypercellular for age: 25%; JAK2 V617F mutation was negative.  CALR mutation positive.  Flow cytometry included about 1% CLL/SLL phenotype cells (UE2+) of uncertain significance and some infiltrate into the marrow with increased atypical megakaryocytes.  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  MDS FISH panel was negative.  Labs in 05/2014 included the following normal studies: direct [place], epo level, haptoglobin, ANA, hemoglobin electrophoresis, hepatitis B surface antigen, HIV testing, UPEP.  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  LDH was elevated (520). Hepatitis C by PCR was less than 2 IU/ml.  Symptomatically, he feels good.  He denies any B symptoms.  Exam is stable.  Hemoglobin is 9.8, platelets 239,000, WBC 2200 (ANC 1300).   Plan: 1.  Review interval labs and notes from Dr. Dolan Amen office. 2.  Labs today:  CBC with diff, CMP, LDH. 3.  Primary myelofibrosis:  Counts stable.  Continue surveillance. 4.  Review fever and neutropenia precuations. 5.  Continue to alternate appointment with [place]. 6.  RTC in 6 months for MD assessment, labs (CBC with diff, CMP, LDH).   Lequita Asal, MD  01/03/2018, 4:35 PM

## 2018-01-03 NOTE — Progress Notes (Signed)
Patient offers no complaints today. 

## 2018-01-09 ENCOUNTER — Other Ambulatory Visit: Payer: Self-pay | Admitting: Family Medicine

## 2018-01-09 NOTE — Telephone Encounter (Signed)
Meclizine (Antivert) 25mg  tab refill Last Refill: 10/10/17 #90 Last OV: 11/24/17; acute visit  02/09/17-last visit with Dr. Wynetta Emery PCP: Dr. Wynetta Emery

## 2018-01-10 DIAGNOSIS — M5416 Radiculopathy, lumbar region: Secondary | ICD-10-CM | POA: Diagnosis not present

## 2018-01-10 DIAGNOSIS — M9905 Segmental and somatic dysfunction of pelvic region: Secondary | ICD-10-CM | POA: Diagnosis not present

## 2018-01-10 DIAGNOSIS — M9903 Segmental and somatic dysfunction of lumbar region: Secondary | ICD-10-CM | POA: Diagnosis not present

## 2018-01-10 DIAGNOSIS — M5136 Other intervertebral disc degeneration, lumbar region: Secondary | ICD-10-CM | POA: Diagnosis not present

## 2018-01-24 ENCOUNTER — Ambulatory Visit (INDEPENDENT_AMBULATORY_CARE_PROVIDER_SITE_OTHER): Payer: Medicare Other

## 2018-01-24 VITALS — BP 112/60 | HR 64 | Temp 98.4°F | Resp 16 | Ht 67.0 in | Wt 185.4 lb

## 2018-01-24 DIAGNOSIS — M5033 Other cervical disc degeneration, cervicothoracic region: Secondary | ICD-10-CM | POA: Diagnosis not present

## 2018-01-24 DIAGNOSIS — M62830 Muscle spasm of back: Secondary | ICD-10-CM | POA: Diagnosis not present

## 2018-01-24 DIAGNOSIS — M9903 Segmental and somatic dysfunction of lumbar region: Secondary | ICD-10-CM | POA: Diagnosis not present

## 2018-01-24 DIAGNOSIS — M9901 Segmental and somatic dysfunction of cervical region: Secondary | ICD-10-CM | POA: Diagnosis not present

## 2018-01-24 DIAGNOSIS — Z0000 Encounter for general adult medical examination without abnormal findings: Secondary | ICD-10-CM | POA: Diagnosis not present

## 2018-01-24 NOTE — Patient Instructions (Signed)
[name] , Thank you for taking time to come for your Medicare Wellness Visit. I appreciate your ongoing commitment to your health goals. Please review the following plan we discussed and let me know if I can assist you in the future.   Screening recommendations/referrals: Colonoscopy:  No longer required Recommended yearly ophthalmology/optometry visit for glaucoma screening and checkup Recommended yearly dental visit for hygiene and checkup  Vaccinations: Influenza vaccine: due 02/2018 Pneumococcal vaccine: completed series Tdap vaccine: up to date Shingles vaccine: shingrix eligible, check with your insurance company for coverage.   Advanced directives: Advance directive discussed with you today. I have provided a copy for you to complete at home and have notarized. Once this is complete please bring a copy in to our office so we can scan it into your chart.  Conditions/risks identified: Recommend drinking at least 6-8 glasses of water a day   Next appointment:Follow up in one year for your annual wellness exam.   Preventive Care 65 Years and Older, Male Preventive care refers to lifestyle choices and visits with your health care provider that can promote health and wellness. What does preventive care include?  A yearly physical exam. This is also called an annual well check.  Dental exams once or twice a year.  Routine eye exams. Ask your health care provider how often you should have your eyes checked.  Personal lifestyle choices, including:  Daily care of your teeth and gums.  Regular physical activity.  Eating a healthy diet.  Avoiding tobacco and drug use.  Limiting alcohol use.  Practicing safe sex.  Taking low doses of aspirin every day.  Taking vitamin and mineral supplements as recommended by your health care provider. What happens during an annual well check? The services and screenings done by your health care provider during your annual well check will  depend on your age, overall health, lifestyle risk factors, and family history of disease. Counseling  Your health care provider may ask you questions about your:  Alcohol use.  Tobacco use.  Drug use.  Emotional well-being.  Home and relationship well-being.  Sexual activity.  Eating habits.  History of falls.  Memory and ability to understand (cognition).  Work and work Statistician. Screening  You may have the following tests or measurements:  Height, weight, and BMI.  Blood pressure.  Lipid and cholesterol levels. These may be checked every 5 years, or more frequently if you are over 17 years old.  Skin check.  Lung cancer screening. You may have this screening every year starting at age 58 if you have a 30-pack-year history of smoking and currently smoke or have quit within the past 15 years.  Fecal occult blood test (FOBT) of the stool. You may have this test every year starting at age 51.  Flexible sigmoidoscopy or colonoscopy. You may have a sigmoidoscopy every 5 years or a colonoscopy every 10 years starting at age 57.  Prostate cancer screening. Recommendations will vary depending on your family history and other risks.  Hepatitis C blood test.  Hepatitis B blood test.  Sexually transmitted disease (STD) testing.  Diabetes screening. This is done by checking your blood sugar (glucose) after you have not eaten for a while (fasting). You may have this done every 1-3 years.  Abdominal aortic aneurysm (AAA) screening. You may need this if you are a current or former smoker.  Osteoporosis. You may be screened starting at age 73 if you are at high risk. Talk with your health  care provider about your test results, treatment options, and if necessary, the need for more tests. Vaccines  Your health care provider may recommend certain vaccines, such as:  Influenza vaccine. This is recommended every year.  Tetanus, diphtheria, and acellular pertussis (Tdap,  Td) vaccine. You may need a Td booster every 10 years.  Zoster vaccine. You may need this after age 65.  Pneumococcal 13-valent conjugate (PCV13) vaccine. One dose is recommended after age 63.  Pneumococcal polysaccharide (PPSV23) vaccine. One dose is recommended after age 46. Talk to your health care provider about which screenings and vaccines you need and how often you need them. This information is not intended to replace advice given to you by your health care provider. Make sure you discuss any questions you have with your health care provider. Document Released: 06/19/2015 Document Revised: 02/10/2016 Document Reviewed: 03/24/2015 Elsevier Interactive Patient Education  2017 [place] Prevention in the Home Falls can cause injuries. They can happen to people of all ages. There are many things you can do to make your home safe and to help prevent falls. What can I do on the outside of my home?  Regularly fix the edges of walkways and driveways and fix any cracks.  Remove anything that might make you trip as you walk through a door, such as a raised step or threshold.  Trim any bushes or trees on the path to your home.  Use bright outdoor lighting.  Clear any walking paths of anything that might make someone trip, such as rocks or tools.  Regularly check to see if handrails are loose or broken. Make sure that both sides of any steps have handrails.  Any raised decks and porches should have guardrails on the edges.  Have any leaves, snow, or ice cleared regularly.  Use sand or salt on walking paths during winter.  Clean up any spills in your garage right away. This includes oil or grease spills. What can I do in the bathroom?  Use night lights.  Install grab bars by the toilet and in the tub and shower. Do not use towel bars as grab bars.  Use non-skid mats or decals in the tub or shower.  If you need to sit down in the shower, use a plastic, non-slip  stool.  Keep the floor dry. Clean up any water that spills on the floor as soon as it happens.  Remove soap buildup in the tub or shower regularly.  Attach bath mats securely with double-sided non-slip rug tape.  Do not have throw rugs and other things on the floor that can make you trip. What can I do in the bedroom?  Use night lights.  Make sure that you have a light by your bed that is easy to reach.  Do not use any sheets or blankets that are too big for your bed. They should not hang down onto the floor.  Have a firm chair that has side arms. You can use this for support while you get dressed.  Do not have throw rugs and other things on the floor that can make you trip. What can I do in the kitchen?  Clean up any spills right away.  Avoid walking on wet floors.  Keep items that you use a lot in easy-to-reach places.  If you need to reach something above you, use a strong step stool that has a grab bar.  Keep electrical cords out of the way.  Do not use floor  polish or wax that makes floors slippery. If you must use wax, use non-skid floor wax.  Do not have throw rugs and other things on the floor that can make you trip. What can I do with my stairs?  Do not leave any items on the stairs.  Make sure that there are handrails on both sides of the stairs and use them. Fix handrails that are broken or loose. Make sure that handrails are as long as the stairways.  Check any carpeting to make sure that it is firmly attached to the stairs. Fix any carpet that is loose or worn.  Avoid having throw rugs at the top or bottom of the stairs. If you do have throw rugs, attach them to the floor with carpet tape.  Make sure that you have a light switch at the top of the stairs and the bottom of the stairs. If you do not have them, ask someone to add them for you. What else can I do to help prevent falls?  Wear shoes that:  Do not have high heels.  Have rubber bottoms.  Are  comfortable and fit you well.  Are closed at the toe. Do not wear sandals.  If you use a stepladder:  Make sure that it is fully opened. Do not climb a closed stepladder.  Make sure that both sides of the stepladder are locked into place.  Ask someone to hold it for you, if possible.  Clearly mark and make sure that you can see:  Any grab bars or handrails.  First and last steps.  Where the edge of each step is.  Use tools that help you move around (mobility aids) if they are needed. These include:  Canes.  Walkers.  Scooters.  Crutches.  Turn on the lights when you go into a dark area. Replace any light bulbs as soon as they burn out.  Set up your furniture so you have a clear path. Avoid moving your furniture around.  If any of your floors are uneven, fix them.  If there are any pets around you, be aware of where they are.  Review your medicines with your doctor. Some medicines can make you feel dizzy. This can increase your chance of falling. Ask your doctor what other things that you can do to help prevent falls. This information is not intended to replace advice given to you by your health care provider. Make sure you discuss any questions you have with your health care provider. Document Released: 03/19/2009 Document Revised: 10/29/2015 Document Reviewed: 06/27/2014 Elsevier Interactive Patient Education  2017 [employer].

## 2018-01-24 NOTE — Progress Notes (Signed)
Subjective:   [name] is a 77 y.o. male who presents for Medicare Annual/Subsequent preventive examination.  Review of Systems:  Cardiac Risk Factors include: advanced age (>89men, >10 women);hypertension;male gender;dyslipidemia;smoking/ tobacco exposure     Objective:    Vitals: BP 112/60 (BP Location: Right Arm, Patient Position: Sitting)   Pulse 64   Temp 98.4 F (36.9 C) (Temporal)   Resp 16   Ht 5\' 7"  (1.702 m)   Wt 185 lb 6.4 oz (84.1 kg)   BMI 29.04 kg/m   Body mass index is 29.04 kg/m.  Advanced Directives 01/24/2018 01/03/2018 07/05/2017 01/18/2017 12/28/2016 06/27/2016 12/11/2015  Does Patient Have a Medical Advance Directive? No No No No No No No  Would patient like information on creating a medical advance directive? Yes (MAU/Ambulatory/Procedural Areas - Information given) - - Yes (MAU/Ambulatory/Procedural Areas - Information given) - - No - patient declined information  Pre-existing out of facility DNR order (yellow form or pink MOST form) - - - - - - -    Tobacco Social History   Tobacco Use  Smoking Status Former Smoker  . Packs/day: 0.25  . Types: Cigarettes  . Last attempt to quit: 06/06/2010  . Years since quitting: 7.6  Smokeless Tobacco Never Used  Tobacco Comment   quit 20 years     Counseling given: Not Answered Comment: quit 20 years   Clinical Intake:  Pre-visit preparation completed: Yes  Pain : No/denies pain     Nutritional Status: BMI 25 -29 Overweight Nutritional Risks: None Diabetes: No  How often do you need to have someone help you when you read instructions, pamphlets, or other written materials from your doctor or pharmacy?: 1 - Never What is the last grade level you completed in school?: some college, has licenses from house inspections etc  Interpreter Needed?: No  Information entered by :: Priyal Musquiz,LPN   Past Medical History:  Diagnosis Date  . Arthritis   . Benign hypertensive renal disease   . GERD  (gastroesophageal reflux disease)   . Heartburn   . History of retinal detachment   . Hyperlipidemia   . Hypertension   . Melanoma ([place])    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 approximately 10-15 years ago  . Myelofibrosis ([place])   . Prostate hypertrophy   . Stroke [facility]) June 2009   R brain subcortical infarct   Past Surgical History:  Procedure Laterality Date  . ASPIRATION / INJECTION RENAL CYST  Feb 2017  . BACK SURGERY     approx 20- 25 years ago  . GAS INSERTION  08/11/2011   Procedure: INSERTION OF GAS;  Surgeon: Hayden Pedro, MD;  Location: [place];  Service: Ophthalmology;  Laterality: Right;  [id number]  . SCLERAL BUCKLE  08/11/2011   Procedure: SCLERAL BUCKLE;  Surgeon: Hayden Pedro, MD;  Location: [place];  Service: Ophthalmology;  Laterality: Right;  Marland Kitchen VARICOSE VEIN SURGERY     Family History  Problem Relation Age of Onset  . Heart disease Father   . Diabetes Son   . Kidney disease Neg Hx   . Prostate cancer Neg Hx    Social History   Socioeconomic History  . Marital status: Married    Spouse name: Parke Simmers   . Number of children: 2  . Years of education: 12+  . Highest education level: Some college, no degree  Occupational History  . 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  Occupation: SELF EMPLOYED    Employer: SELF EMPLOYED  Social Needs  . Financial resource strain: Not hard at all  . Food insecurity:    Worry: Never true    Inability: Never true  . Transportation needs:    Medical: No    Non-medical: No  Tobacco Use  . Smoking status: Former Smoker    Packs/day: 0.25    Types: Cigarettes    Last attempt to quit: 06/06/2010    Years since quitting: 7.6  . Smokeless tobacco: Never Used  . Tobacco comment: quit 20 years  Substance and Sexual Activity  . Alcohol use: No    Alcohol/week: 0.0 standard drinks  . Drug use: No  . Sexual activity: Not on file  Lifestyle  . Physical activity:    Days per week: 0 days    Minutes per  session: 0 min  . Stress: Not at all  Relationships  . Social connections:    Talks on phone: More than three times a week    Gets together: More than three times a week    Attends religious service: More than 4 times per year    Active member of club or organization: Yes    Attends meetings of clubs or organizations: More than 4 times per year    Relationship status: Married  Other Topics Concern  . Not on file  Social History Narrative   Pt lives at home with his family.   Caffeine Use- 2 cups daily   Patient has 2 children.    Patient has some college.    Patient is right handed.          Works full time    Outpatient Encounter Medications as of 01/24/2018  Medication Sig  . clopidogrel (PLAVIX) 75 MG tablet TAKE ONE TABLET BY MOUTH DAILY  . diclofenac sodium (VOLTAREN) 1 % GEL Apply 4 g topically 4 (four) times daily as needed (for neck pain).  Marland Kitchen gabapentin (NEURONTIN) 300 MG capsule Take 1 capsule (300 mg total) by mouth 3 (three) times daily.  Alben Deeds Palmetto (MULTI GINSENG & SAW PALMETTO) 500 MG CAPS Take 500 mg by mouth daily.  . Loratadine 10 MG CAPS Take by mouth.  . losartan (COZAAR) 100 MG tablet TAKE ONE TABLET BY MOUTH DAILY  . meclizine (ANTIVERT) 25 MG tablet TAKE 1 TABLET BY MOUTH 3 TIMES DAILY SA NEEDED FOR DIZZINESS  . Multiple Vitamin (MULITIVITAMIN WITH MINERALS) TABS Take 1 tablet by mouth daily.  . Naproxen Sodium (ALEVE) 220 MG CAPS Take by mouth.  . ranitidine (ZANTAC) 150 MG tablet Take 150 mg by mouth 2 (two) times daily.  . simvastatin (ZOCOR) 40 MG tablet simvastatin 40 mg tablet  . tamsulosin (FLOMAX) 0.4 MG CAPS capsule TAKE 1 CAPSULE BY MOUTH DAILY  . atorvastatin (LIPITOR) 10 MG tablet TAKE ONE TABLET BY MOUTH AT BEDTIME (Patient not taking: Reported on 01/24/2018)  . chlorpheniramine (CHLOR-TRIMETON) 4 MG tablet Take 4 mg by mouth 2 (two) times daily as needed for allergies.  Marland Kitchen terbinafine (LAMISIL) 250 MG tablet Take 1 tablet (250 mg  total) by mouth daily. (Patient not taking: Reported on 01/24/2018)   No facility-administered encounter medications on file as of 01/24/2018.     Activities of Daily Living In your present state of health, do you have any difficulty performing the following activities: 01/24/2018  Hearing? Y  Comment has bilateral hearing aids   Vision? N  Comment reading glasses   Difficulty concentrating or making  decisions? Y  Walking or climbing stairs? N  Dressing or bathing? N  Doing errands, shopping? N  Preparing Food and eating ? N  Using the Toilet? N  In the past six months, have you accidently leaked urine? N  Do you have problems with loss of bowel control? N  Managing your Medications? N  Managing your Finances? N  Housekeeping or managing your Housekeeping? N  Some recent data might be hidden    Patient Care Team: [name], DO as PCP - General (Family Medicine) Pieter Partridge, MD as Referring Physician (Hematology) Lequita Asal, MD as Referring Physician (Hematology and Oncology) Hayden Pedro, MD as Consulting Physician (Ophthalmology) Garvin Fila, MD as Consulting Physician (Neurology) Festus Aloe, MD as Consulting Physician (Urology) Minna Merritts, MD as Consulting Physician (Cardiology) Abbie Sons, MD (Urology)   Assessment:   This is a routine wellness examination for [place].  Exercise Activities and Dietary recommendations Current Exercise Habits: The patient has a physically strenous job, but has no regular exercise apart from work., Exercise limited by: None identified  Goals    . DIET - INCREASE WATER INTAKE     Recommend drinking at least 6-8 glasses of water a day        Fall Risk Fall Risk  01/24/2018 01/18/2017 04/14/2016 08/31/2015  Falls in the past year? No Yes No No  Number falls in past yr: - 1 - -  Injury with Fall? - Yes - -  Follow up - Falls prevention discussed - -   Is the patient's home free of loose  throw rugs in walkways, pet beds, electrical cords, etc?   yes      Grab bars in the bathroom? yes      Handrails on the stairs?   yes      Adequate lighting?   yes  Timed Get Up and Go Performed: Completed in 8 seconds with no use of assistive devices, steady gait. No intervention needed at this time.   Depression Screen PHQ 2/9 Scores 01/24/2018 01/18/2017 04/14/2016  PHQ - 2 Score 0 0 0    Cognitive Function     6CIT Screen 01/24/2018 01/18/2017  What Year? 0 points 0 points  What month? 0 points 0 points  What time? 0 points 0 points  Count back from 20 0 points 0 points  Months in reverse 0 points 0 points  Repeat phrase 0 points 2 points  Total Score 0 2    Immunization History  Administered Date(s) Administered  . Influenza, High Dose Seasonal PF 02/09/2017  . Influenza,inj,Quad PF,6+ Mos 03/03/2015  . Influenza-Unspecified 03/07/2016  . Pneumococcal Conjugate-13 07/08/2015  . Pneumococcal Polysaccharide-23 08/12/2011  . Td 10/11/2016  . Zoster 06/07/2011    Qualifies for Shingles Vaccine? Yes, discussed shingrix vaccine   Screening Tests Health Maintenance  Topic Date Due  . INFLUENZA VACCINE  01/04/2018  . TETANUS/TDAP  10/12/2026  . PNA vac Low Risk Adult  Completed   Cancer Screenings: Lung: Low Dose CT Chest recommended if Age 17-80 years, 30 pack-year currently smoking OR have quit w/in 15years. Patient does not qualify. Colorectal: no longer required  Additional Screenings: Hepatitis C Screening: not indicated       Plan:    I have personally reviewed and addressed the Medicare Annual Wellness questionnaire and have noted the following in the patient's chart:  A. Medical and social history B. Use of alcohol, tobacco or illicit drugs  C. Current  medications and supplements D. Functional ability and status E.  Nutritional status F.  Physical activity G. Advance directives H. List of other physicians I.  Hospitalizations, surgeries, and ER visits  in previous 12 months J.  [place] such as hearing and vision if needed, cognitive and depression L. Referrals and appointments   In addition, I have reviewed and discussed with patient certain preventive protocols, quality metrics, and best practice recommendations. A written personalized care plan for preventive services as well as general preventive health recommendations were provided to patient.   Signed,  Tyler Aas, LPN Nurse Health Advisor   Nurse Notes:none

## 2018-02-05 DIAGNOSIS — D72819 Decreased white blood cell count, unspecified: Secondary | ICD-10-CM | POA: Insufficient documentation

## 2018-02-06 ENCOUNTER — Other Ambulatory Visit: Payer: Self-pay | Admitting: Family Medicine

## 2018-02-06 NOTE — Telephone Encounter (Signed)
Needs appointment. Hasn't been seen for regular appointment since 10/2016

## 2018-02-07 DIAGNOSIS — M9901 Segmental and somatic dysfunction of cervical region: Secondary | ICD-10-CM | POA: Diagnosis not present

## 2018-02-07 DIAGNOSIS — M62830 Muscle spasm of back: Secondary | ICD-10-CM | POA: Diagnosis not present

## 2018-02-07 DIAGNOSIS — M9903 Segmental and somatic dysfunction of lumbar region: Secondary | ICD-10-CM | POA: Diagnosis not present

## 2018-02-07 DIAGNOSIS — M5033 Other cervical disc degeneration, cervicothoracic region: Secondary | ICD-10-CM | POA: Diagnosis not present

## 2018-02-09 NOTE — Telephone Encounter (Signed)
Patient scheduled follow up 9/30 @ 1  Just FYI  Thanks

## 2018-02-21 DIAGNOSIS — M9903 Segmental and somatic dysfunction of lumbar region: Secondary | ICD-10-CM | POA: Diagnosis not present

## 2018-02-21 DIAGNOSIS — M9901 Segmental and somatic dysfunction of cervical region: Secondary | ICD-10-CM | POA: Diagnosis not present

## 2018-02-21 DIAGNOSIS — M5033 Other cervical disc degeneration, cervicothoracic region: Secondary | ICD-10-CM | POA: Diagnosis not present

## 2018-02-21 DIAGNOSIS — M62830 Muscle spasm of back: Secondary | ICD-10-CM | POA: Diagnosis not present

## 2018-03-04 NOTE — Progress Notes (Signed)
BP 137/75 (BP Location: Left Arm, Patient Position: Sitting, Cuff Size: Normal)   Pulse (!) 59   Wt 192 lb 5 oz (87.2 kg)   SpO2 98%   BMI 30.12 kg/m    Subjective:    Patient ID: [name], male    DOB: 04/17/1941, 77 y.o.   MRN: [id number]  HPI: [name] is a 77 y.o. male  Chief Complaint  Patient presents with  . Hypertension  . Hyperlipidemia   Known myelofibrosis. Continues to follow with both [facility]. Has been feeling well. No B-symptoms. No concerns.   HYPERTENSION / HYPERLIPIDEMIA Satisfied with current treatment? yes Duration of hypertension: chronic BP monitoring frequency: not checking BP medication side effects: no Past BP meds: losartan Duration of hyperlipidemia: chronic Cholesterol medication side effects: no Cholesterol supplements: none Past cholesterol medications: atrovastatin, simvastatin Medication compliance: fair compliance Aspirin: no Recent stressors: no Recurrent headaches: no Visual changes: no Palpitations: no Dyspnea: no Chest pain: no Lower extremity edema: no Dizzy/lightheaded: no  LUMP Duration: chronic Location: center of back Onset: gradual Painful: no Discomfort: no Status:  not changing Trauma: no Redness: no Bruising: no Recent infection: no Swollen lymph nodes: no Requesting removal: no  Relevant past medical, surgical, family and social history reviewed and updated as indicated. Interim medical history since our last visit reviewed. Allergies and medications reviewed and updated.  Review of Systems  Constitutional: Negative.   Respiratory: Negative.   Cardiovascular: Negative.   Neurological: Negative.   Psychiatric/Behavioral: Negative.     Per HPI unless specifically indicated above     Objective:    BP 137/75 (BP Location: Left Arm, Patient Position: Sitting, Cuff Size: Normal)   Pulse (!) 59   Wt 192 lb 5 oz (87.2 kg)   SpO2 98%   BMI 30.12 kg/m   Wt Readings from  Last 3 Encounters:  03/05/18 192 lb 5 oz (87.2 kg)  01/24/18 185 lb 6.4 oz (84.1 kg)  01/03/18 187 lb 2.7 oz (84.9 kg)    Physical Exam  Constitutional: He is oriented to person, place, and time. He appears well-developed and well-nourished. No distress.  HENT:  Head: Normocephalic and atraumatic.  Right Ear: Hearing normal.  Left Ear: Hearing normal.  Nose: Nose normal.  Eyes: Conjunctivae and lids are normal. Right eye exhibits no discharge. Left eye exhibits no discharge. No scleral icterus.  Cardiovascular: Normal rate, regular rhythm, normal heart sounds and intact distal pulses. Exam reveals no gallop and no friction rub.  No murmur heard. Pulmonary/Chest: Effort normal and breath sounds normal. No stridor. No respiratory distress. He has no wheezes. He has no rales. He exhibits no tenderness.  Musculoskeletal: Normal range of motion.  Neurological: He is alert and oriented to person, place, and time.  Skin: Skin is warm, dry and intact. Capillary refill takes less than 2 seconds. No rash noted. He is not diaphoretic. No erythema. No pallor.  2 inch sebaceous cyst in center of his back  Psychiatric: He has a normal mood and affect. His speech is normal and behavior is normal. Judgment and thought content normal. Cognition and memory are normal.  Nursing note and vitals reviewed.   Results for orders placed or performed in visit on 01/03/18  Lactate dehydrogenase  Result Value Ref Range   LDH 407 (H) 98 - 192 U/L  Comprehensive metabolic panel  Result Value Ref Range   Sodium 141 135 - 145 mmol/L   Potassium 4.5 3.5 -  5.1 mmol/L   Chloride 109 98 - 111 mmol/L   CO2 23 22 - 32 mmol/L   Glucose, Bld 142 (H) 70 - 99 mg/dL   BUN 32 (H) 8 - 23 mg/dL   Creatinine, Ser 1.24 0.61 - 1.24 mg/dL   Calcium 8.9 8.9 - 10.3 mg/dL   Total Protein 6.5 6.5 - 8.1 g/dL   Albumin 4.1 3.5 - 5.0 g/dL   AST 30 15 - 41 U/L   ALT 25 0 - 44 U/L   Alkaline Phosphatase 88 38 - 126 U/L   Total  Bilirubin 1.2 0.3 - 1.2 mg/dL   GFR calc non Af Amer 54 (L) >60 mL/min   GFR calc Af Amer >60 >60 mL/min   Anion gap 9 5 - 15  CBC with Differential  Result Value Ref Range   WBC 2.2 (L) 3.8 - 10.6 K/uL   RBC 3.18 (L) 4.40 - 5.90 MIL/uL   Hemoglobin 9.8 (L) 13.0 - 18.0 g/dL   HCT 29.4 (L) 40.0 - 52.0 %   MCV 92.5 80.0 - 100.0 fL   MCH 30.9 26.0 - 34.0 pg   MCHC 33.4 32.0 - 36.0 g/dL   RDW 17.8 (H) 11.5 - 14.5 %   Platelets 239 150 - 440 K/uL   Neutrophils Relative % 58 %   Neutro Abs 1.3 (L) 1.4 - 6.5 K/uL   Lymphocytes Relative 27 %   Lymphs Abs 0.6 (L) 1.0 - 3.6 K/uL   Monocytes Relative 14 %   Monocytes Absolute 0.3 0.2 - 1.0 K/uL   Eosinophils Relative 0 %   Eosinophils Absolute 0.0 0 - 0.7 K/uL   Basophils Relative 1 %   Basophils Absolute 0.0 0 - 0.1 K/uL      Assessment & Plan:   Problem List Items Addressed This Visit      Digestive   Hepatosplenomegaly    Rechecking levels today. Await results.       Relevant Orders   CBC with Differential/Platelet   Comprehensive metabolic panel     Genitourinary   Benign hypertensive renal disease - Primary    Under good control on current regimen. Continue current regimen. Continue to monitor. Call with any concerns. Refills given.        Relevant Orders   CBC with Differential/Platelet   Comprehensive metabolic panel   Microalbumin, Urine Waived     Other   Myelofibrosis (HCC)    Rechecking levels today. Await results.       Relevant Orders   CBC with Differential/Platelet   Comprehensive metabolic panel   Hyperkalemia    Rechecking levels today. Await results.       Relevant Orders   CBC with Differential/Platelet   Comprehensive metabolic panel   Anemia complicating neoplastic disease    Rechecking levels today. Await results.       Relevant Orders   CBC with Differential/Platelet   Comprehensive metabolic panel   Mixed hyperlipidemia    Under good control on current regimen. Continue current  regimen. Continue to monitor. Call with any concerns. Refills given.        Relevant Medications   simvastatin (ZOCOR) 40 MG tablet   losartan (COZAAR) 100 MG tablet   Other Relevant Orders   CBC with Differential/Platelet   Comprehensive metabolic panel   Lipid Panel w/o Chol/HDL Ratio    Other Visit Diagnoses    Sebaceous cyst       Reassured patient- not bothering him, will remove if starts  to bother him.        Follow up plan: Return in about 6 months (around 09/03/2018) for Physical/Wellness.

## 2018-03-05 ENCOUNTER — Encounter: Payer: Self-pay | Admitting: Family Medicine

## 2018-03-05 ENCOUNTER — Ambulatory Visit (INDEPENDENT_AMBULATORY_CARE_PROVIDER_SITE_OTHER): Payer: Medicare Other | Admitting: Family Medicine

## 2018-03-05 VITALS — BP 137/75 | HR 59 | Wt 192.3 lb

## 2018-03-05 DIAGNOSIS — L723 Sebaceous cyst: Secondary | ICD-10-CM

## 2018-03-05 DIAGNOSIS — D7581 Myelofibrosis: Secondary | ICD-10-CM

## 2018-03-05 DIAGNOSIS — E782 Mixed hyperlipidemia: Secondary | ICD-10-CM | POA: Diagnosis not present

## 2018-03-05 DIAGNOSIS — R162 Hepatomegaly with splenomegaly, not elsewhere classified: Secondary | ICD-10-CM

## 2018-03-05 DIAGNOSIS — I129 Hypertensive chronic kidney disease with stage 1 through stage 4 chronic kidney disease, or unspecified chronic kidney disease: Secondary | ICD-10-CM | POA: Diagnosis not present

## 2018-03-05 DIAGNOSIS — D630 Anemia in neoplastic disease: Secondary | ICD-10-CM

## 2018-03-05 DIAGNOSIS — E875 Hyperkalemia: Secondary | ICD-10-CM

## 2018-03-05 LAB — MICROALBUMIN, URINE WAIVED
Creatinine, Urine Waived: 200 mg/dL (ref 10–300)
Microalb, Ur Waived: 150 mg/L — ABNORMAL HIGH (ref 0–19)
Microalb/Creat Ratio: >300 mg/g — ABNORMAL HIGH (ref <30)

## 2018-03-05 MED ORDER — LOSARTAN POTASSIUM 100 MG PO TABS: 100.0000 mg | ORAL_TABLET | Freq: Every day | ORAL | 1 refills | Status: DC

## 2018-03-05 MED ORDER — SIMVASTATIN 40 MG PO TABS: ORAL_TABLET | ORAL | 1 refills | Status: DC

## 2018-03-05 NOTE — Assessment & Plan Note (Signed)
Under good control on current regimen. Continue current regimen. Continue to monitor. Call with any concerns. Refills given.   

## 2018-03-05 NOTE — Assessment & Plan Note (Signed)
Rechecking levels today. Await results.  

## 2018-03-06 ENCOUNTER — Encounter: Payer: Self-pay | Admitting: Family Medicine

## 2018-03-06 LAB — COMPREHENSIVE METABOLIC PANEL
ALT: 30 IU/L (ref 0–44)
AST: 31 IU/L (ref 0–40)
Albumin/Globulin Ratio: 2.9 — ABNORMAL HIGH (ref 1.2–2.2)
Albumin: 4.3 g/dL (ref 3.5–4.8)
Alkaline Phosphatase: 105 IU/L (ref 39–117)
BUN/Creatinine Ratio: 19 (ref 10–24)
BUN: 22 mg/dL (ref 8–27)
Bilirubin Total: 0.9 mg/dL (ref 0.0–1.2)
CO2: 23 mmol/L (ref 20–29)
Calcium: 9.3 mg/dL (ref 8.6–10.2)
Chloride: 105 mmol/L (ref 96–106)
Creatinine, Ser: 1.15 mg/dL (ref 0.76–1.27)
GFR calc Af Amer: 71 mL/min/{1.73_m2} (ref >59)
GFR calc non Af Amer: 61 mL/min/{1.73_m2} (ref >59)
Globulin, Total: 1.5 g/dL (ref 1.5–4.5)
Glucose: 106 mg/dL — ABNORMAL HIGH (ref 65–99)
Potassium: 4.8 mmol/L (ref 3.5–5.2)
Sodium: 145 mmol/L — ABNORMAL HIGH (ref 134–144)
Total Protein: 5.8 g/dL — ABNORMAL LOW (ref 6.0–8.5)

## 2018-03-06 LAB — CBC WITH DIFFERENTIAL/PLATELET
Basophils Absolute: 0 10*3/uL (ref 0.0–0.2)
Basos: 1 %
EOS (ABSOLUTE): 0 10*3/uL (ref 0.0–0.4)
Eos: 0 %
Hematocrit: 28.6 % — ABNORMAL LOW (ref 37.5–51.0)
Hemoglobin: 9.5 g/dL — ABNORMAL LOW (ref 13.0–17.7)
Immature Grans (Abs): 0.1 10*3/uL (ref 0.0–0.1)
Immature Granulocytes: 5 %
Lymphocytes Absolute: 0.5 10*3/uL — ABNORMAL LOW (ref 0.7–3.1)
Lymphs: 24 %
MCH: 30.5 pg (ref 26.6–33.0)
MCHC: 33.2 g/dL (ref 31.5–35.7)
MCV: 92 fL (ref 79–97)
Monocytes Absolute: 0.3 10*3/uL (ref 0.1–0.9)
Monocytes: 15 %
NRBC: 2 % — ABNORMAL HIGH (ref 0–0)
Neutrophils Absolute: 1.2 10*3/uL — ABNORMAL LOW (ref 1.4–7.0)
Neutrophils: 55 %
Platelets: 234 10*3/uL (ref 150–450)
RBC: 3.11 x10E6/uL — ABNORMAL LOW (ref 4.14–5.80)
RDW: 15.6 % — ABNORMAL HIGH (ref 12.3–15.4)
WBC: 2.2 10*3/uL — CL (ref 3.4–10.8)

## 2018-03-06 LAB — LIPID PANEL W/O CHOL/HDL RATIO
Cholesterol, Total: 99 mg/dL — ABNORMAL LOW (ref 100–199)
HDL: 30 mg/dL — ABNORMAL LOW (ref >39)
LDL Calculated: 50 mg/dL (ref 0–99)
Triglycerides: 95 mg/dL (ref 0–149)
VLDL Cholesterol Cal: 19 mg/dL (ref 5–40)

## 2018-03-07 DIAGNOSIS — M9903 Segmental and somatic dysfunction of lumbar region: Secondary | ICD-10-CM | POA: Diagnosis not present

## 2018-03-07 DIAGNOSIS — M5033 Other cervical disc degeneration, cervicothoracic region: Secondary | ICD-10-CM | POA: Diagnosis not present

## 2018-03-07 DIAGNOSIS — M9901 Segmental and somatic dysfunction of cervical region: Secondary | ICD-10-CM | POA: Diagnosis not present

## 2018-03-07 DIAGNOSIS — M62830 Muscle spasm of back: Secondary | ICD-10-CM | POA: Diagnosis not present

## 2018-03-13 ENCOUNTER — Ambulatory Visit (INDEPENDENT_AMBULATORY_CARE_PROVIDER_SITE_OTHER): Payer: Medicare Other

## 2018-03-13 DIAGNOSIS — Z23 Encounter for immunization: Secondary | ICD-10-CM

## 2018-03-13 NOTE — Patient Instructions (Signed)

## 2018-03-21 DIAGNOSIS — M9903 Segmental and somatic dysfunction of lumbar region: Secondary | ICD-10-CM | POA: Diagnosis not present

## 2018-03-21 DIAGNOSIS — M5033 Other cervical disc degeneration, cervicothoracic region: Secondary | ICD-10-CM | POA: Diagnosis not present

## 2018-03-21 DIAGNOSIS — M9901 Segmental and somatic dysfunction of cervical region: Secondary | ICD-10-CM | POA: Diagnosis not present

## 2018-03-21 DIAGNOSIS — M62830 Muscle spasm of back: Secondary | ICD-10-CM | POA: Diagnosis not present

## 2018-04-04 DIAGNOSIS — M9903 Segmental and somatic dysfunction of lumbar region: Secondary | ICD-10-CM | POA: Diagnosis not present

## 2018-04-04 DIAGNOSIS — M62830 Muscle spasm of back: Secondary | ICD-10-CM | POA: Diagnosis not present

## 2018-04-04 DIAGNOSIS — M9901 Segmental and somatic dysfunction of cervical region: Secondary | ICD-10-CM | POA: Diagnosis not present

## 2018-04-04 DIAGNOSIS — M5033 Other cervical disc degeneration, cervicothoracic region: Secondary | ICD-10-CM | POA: Diagnosis not present

## 2018-04-17 ENCOUNTER — Other Ambulatory Visit: Payer: Self-pay | Admitting: Family Medicine

## 2018-04-17 NOTE — Telephone Encounter (Signed)
Requested medication (s) are due for refill today: yes  Requested medication (s) are on the active medication list: yes  Last refill:  01/10/18 #90  Future visit scheduled: yes, 09/03/18  Notes to clinic:  Unable to fill per protocol; LOV on 03/05/18    Requested Prescriptions  Pending Prescriptions Disp Refills   meclizine (ANTIVERT) 25 MG tablet [Pharmacy Med Name: MECLIZINE HCL 25 MG TAB] 90 tablet 0    Sig: TAKE 1 TABLET BY MOUTH 3 TIMES DAILY AS NEEDED FOR DIZZINESS     Not Delegated - Gastroenterology: Antiemetics Failed - 04/17/2018  3:39 PM      Failed - This refill cannot be delegated      Passed - Valid encounter within last 6 months    Recent Outpatient Visits          1 month ago Benign hypertensive renal disease   [place], Megan P, DO   4 months ago Acute non-recurrent frontal sinusitis   [facility] Carles Collet M, PA-C   1 year ago Chronic right shoulder pain   [place], [place], DO   1 year ago Benign hypertensive renal disease   [facility] [place], Delia, DO   1 year ago Benign hypertensive renal disease   [facility] [place], Barb Merino, DO      Future Appointments            In 4 months Johnson, Barb Merino, DO [place], [place]   In 9 months  [employer], [facility]

## 2018-04-18 DIAGNOSIS — M9903 Segmental and somatic dysfunction of lumbar region: Secondary | ICD-10-CM | POA: Diagnosis not present

## 2018-04-18 DIAGNOSIS — M5033 Other cervical disc degeneration, cervicothoracic region: Secondary | ICD-10-CM | POA: Diagnosis not present

## 2018-04-18 DIAGNOSIS — M62830 Muscle spasm of back: Secondary | ICD-10-CM | POA: Diagnosis not present

## 2018-04-18 DIAGNOSIS — M9901 Segmental and somatic dysfunction of cervical region: Secondary | ICD-10-CM | POA: Diagnosis not present

## 2018-05-02 DIAGNOSIS — M5033 Other cervical disc degeneration, cervicothoracic region: Secondary | ICD-10-CM | POA: Diagnosis not present

## 2018-05-02 DIAGNOSIS — M9901 Segmental and somatic dysfunction of cervical region: Secondary | ICD-10-CM | POA: Diagnosis not present

## 2018-05-02 DIAGNOSIS — M62830 Muscle spasm of back: Secondary | ICD-10-CM | POA: Diagnosis not present

## 2018-05-02 DIAGNOSIS — M9903 Segmental and somatic dysfunction of lumbar region: Secondary | ICD-10-CM | POA: Diagnosis not present

## 2018-05-10 DIAGNOSIS — R162 Hepatomegaly with splenomegaly, not elsewhere classified: Secondary | ICD-10-CM | POA: Diagnosis not present

## 2018-05-10 DIAGNOSIS — D630 Anemia in neoplastic disease: Secondary | ICD-10-CM | POA: Diagnosis not present

## 2018-05-10 DIAGNOSIS — D471 Chronic myeloproliferative disease: Secondary | ICD-10-CM | POA: Diagnosis not present

## 2018-05-16 DIAGNOSIS — M5033 Other cervical disc degeneration, cervicothoracic region: Secondary | ICD-10-CM | POA: Diagnosis not present

## 2018-05-16 DIAGNOSIS — M62830 Muscle spasm of back: Secondary | ICD-10-CM | POA: Diagnosis not present

## 2018-05-16 DIAGNOSIS — M9901 Segmental and somatic dysfunction of cervical region: Secondary | ICD-10-CM | POA: Diagnosis not present

## 2018-05-16 DIAGNOSIS — M9903 Segmental and somatic dysfunction of lumbar region: Secondary | ICD-10-CM | POA: Diagnosis not present

## 2018-05-22 DIAGNOSIS — E119 Type 2 diabetes mellitus without complications: Secondary | ICD-10-CM | POA: Diagnosis not present

## 2018-05-29 DIAGNOSIS — M62830 Muscle spasm of back: Secondary | ICD-10-CM | POA: Diagnosis not present

## 2018-05-29 DIAGNOSIS — M9903 Segmental and somatic dysfunction of lumbar region: Secondary | ICD-10-CM | POA: Diagnosis not present

## 2018-05-29 DIAGNOSIS — M9901 Segmental and somatic dysfunction of cervical region: Secondary | ICD-10-CM | POA: Diagnosis not present

## 2018-05-29 DIAGNOSIS — M5033 Other cervical disc degeneration, cervicothoracic region: Secondary | ICD-10-CM | POA: Diagnosis not present

## 2018-06-13 ENCOUNTER — Other Ambulatory Visit: Payer: Self-pay | Admitting: Family Medicine

## 2018-06-13 DIAGNOSIS — M9901 Segmental and somatic dysfunction of cervical region: Secondary | ICD-10-CM | POA: Diagnosis not present

## 2018-06-13 DIAGNOSIS — M5033 Other cervical disc degeneration, cervicothoracic region: Secondary | ICD-10-CM | POA: Diagnosis not present

## 2018-06-13 DIAGNOSIS — M62830 Muscle spasm of back: Secondary | ICD-10-CM | POA: Diagnosis not present

## 2018-06-13 DIAGNOSIS — M9903 Segmental and somatic dysfunction of lumbar region: Secondary | ICD-10-CM | POA: Diagnosis not present

## 2018-06-27 DIAGNOSIS — M9903 Segmental and somatic dysfunction of lumbar region: Secondary | ICD-10-CM | POA: Diagnosis not present

## 2018-06-27 DIAGNOSIS — M9901 Segmental and somatic dysfunction of cervical region: Secondary | ICD-10-CM | POA: Diagnosis not present

## 2018-06-27 DIAGNOSIS — M62830 Muscle spasm of back: Secondary | ICD-10-CM | POA: Diagnosis not present

## 2018-06-27 DIAGNOSIS — M5033 Other cervical disc degeneration, cervicothoracic region: Secondary | ICD-10-CM | POA: Diagnosis not present

## 2018-07-02 ENCOUNTER — Other Ambulatory Visit: Payer: Self-pay

## 2018-07-02 DIAGNOSIS — Z87898 Personal history of other specified conditions: Secondary | ICD-10-CM

## 2018-07-04 ENCOUNTER — Inpatient Hospital Stay: Payer: Medicare Other

## 2018-07-04 ENCOUNTER — Encounter: Payer: Self-pay | Admitting: Hematology and Oncology

## 2018-07-04 ENCOUNTER — Inpatient Hospital Stay: Payer: Medicare Other | Attending: Hematology and Oncology | Admitting: Hematology and Oncology

## 2018-07-04 ENCOUNTER — Other Ambulatory Visit: Payer: Self-pay | Admitting: Hematology and Oncology

## 2018-07-04 VITALS — BP 133/76 | HR 73 | Temp 98.0°F | Resp 16 | Wt 190.5 lb

## 2018-07-04 DIAGNOSIS — D7581 Myelofibrosis: Secondary | ICD-10-CM | POA: Diagnosis not present

## 2018-07-04 DIAGNOSIS — Z79899 Other long term (current) drug therapy: Secondary | ICD-10-CM | POA: Diagnosis not present

## 2018-07-04 DIAGNOSIS — D649 Anemia, unspecified: Secondary | ICD-10-CM | POA: Insufficient documentation

## 2018-07-04 DIAGNOSIS — Z87898 Personal history of other specified conditions: Secondary | ICD-10-CM

## 2018-07-04 DIAGNOSIS — Z85820 Personal history of malignant melanoma of skin: Secondary | ICD-10-CM

## 2018-07-04 DIAGNOSIS — N400 Enlarged prostate without lower urinary tract symptoms: Secondary | ICD-10-CM

## 2018-07-04 DIAGNOSIS — D471 Chronic myeloproliferative disease: Secondary | ICD-10-CM

## 2018-07-04 DIAGNOSIS — R350 Frequency of micturition: Secondary | ICD-10-CM | POA: Diagnosis not present

## 2018-07-04 DIAGNOSIS — D72819 Decreased white blood cell count, unspecified: Secondary | ICD-10-CM | POA: Insufficient documentation

## 2018-07-04 DIAGNOSIS — R161 Splenomegaly, not elsewhere classified: Secondary | ICD-10-CM | POA: Insufficient documentation

## 2018-07-04 DIAGNOSIS — M1712 Unilateral primary osteoarthritis, left knee: Secondary | ICD-10-CM

## 2018-07-04 DIAGNOSIS — Z87891 Personal history of nicotine dependence: Secondary | ICD-10-CM

## 2018-07-04 LAB — COMPREHENSIVE METABOLIC PANEL
ALT: 27 U/L (ref 0–44)
AST: 29 U/L (ref 15–41)
Albumin: 4.1 g/dL (ref 3.5–5.0)
Alkaline Phosphatase: 108 U/L (ref 38–126)
Anion gap: 5 (ref 5–15)
BUN: 23 mg/dL (ref 8–23)
CO2: 23 mmol/L (ref 22–32)
Calcium: 8.4 mg/dL — ABNORMAL LOW (ref 8.9–10.3)
Chloride: 109 mmol/L (ref 98–111)
Creatinine, Ser: 1.43 mg/dL — ABNORMAL HIGH (ref 0.61–1.24)
GFR calc Af Amer: 54 mL/min — ABNORMAL LOW (ref >60)
GFR calc non Af Amer: 47 mL/min — ABNORMAL LOW (ref >60)
Glucose, Bld: 109 mg/dL — ABNORMAL HIGH (ref 70–99)
Potassium: 4.7 mmol/L (ref 3.5–5.1)
Sodium: 137 mmol/L (ref 135–145)
Total Bilirubin: 1.1 mg/dL (ref 0.3–1.2)
Total Protein: 6.5 g/dL (ref 6.5–8.1)

## 2018-07-04 LAB — URINALYSIS, COMPLETE (UACMP) WITH MICROSCOPIC
Bacteria, UA: NONE SEEN
Bilirubin Urine: NEGATIVE
Glucose, UA: NEGATIVE mg/dL
Hgb urine dipstick: NEGATIVE
Ketones, ur: NEGATIVE mg/dL
Leukocytes, UA: NEGATIVE
Nitrite: NEGATIVE
Protein, ur: >300 mg/dL — AB
Specific Gravity, Urine: 1.025 (ref 1.005–1.030)
Squamous Epithelial / LPF: NONE SEEN (ref 0–5)
WBC, UA: NONE SEEN WBC/hpf (ref 0–5)
pH: 6 (ref 5.0–8.0)

## 2018-07-04 LAB — CBC WITH DIFFERENTIAL/PLATELET
Abs Immature Granulocytes: 0.14 10*3/uL — ABNORMAL HIGH (ref 0.00–0.07)
Basophils Absolute: 0 10*3/uL (ref 0.0–0.1)
Basophils Relative: 0 %
Eosinophils Absolute: 0 10*3/uL (ref 0.0–0.5)
Eosinophils Relative: 0 %
HCT: 30.6 % — ABNORMAL LOW (ref 39.0–52.0)
Hemoglobin: 10 g/dL — ABNORMAL LOW (ref 13.0–17.0)
Immature Granulocytes: 5 %
Lymphocytes Relative: 25 %
Lymphs Abs: 0.7 10*3/uL (ref 0.7–4.0)
MCH: 31.1 pg (ref 26.0–34.0)
MCHC: 32.7 g/dL (ref 30.0–36.0)
MCV: 95 fL (ref 80.0–100.0)
Monocytes Absolute: 0.4 10*3/uL (ref 0.1–1.0)
Monocytes Relative: 14 %
Neutro Abs: 1.4 10*3/uL — ABNORMAL LOW (ref 1.7–7.7)
Neutrophils Relative %: 56 %
Platelets: 226 10*3/uL (ref 150–400)
RBC: 3.22 MIL/uL — ABNORMAL LOW (ref 4.22–5.81)
RDW: 16.9 % — ABNORMAL HIGH (ref 11.5–15.5)
WBC: 2.6 10*3/uL — ABNORMAL LOW (ref 4.0–10.5)
nRBC: 2.3 % — ABNORMAL HIGH (ref 0.0–0.2)

## 2018-07-04 LAB — LACTATE DEHYDROGENASE: LDH: 400 U/L — ABNORMAL HIGH (ref 98–192)

## 2018-07-04 LAB — FERRITIN: Ferritin: 253 ng/mL (ref 24–336)

## 2018-07-04 NOTE — Progress Notes (Signed)
Pt here for follow up.  Reports starting about 2 weeks ago has had frequency of urination. Denies any burning, foul odor, or temperature. States he will make PCP aware of s/s.

## 2018-07-04 NOTE — Progress Notes (Signed)
[place] [facility] day:  07/04/2018   Chief Complaint: [name] is a 78 y.o. male  with myelofibrosis who is seen for 6 month assessment.  HPI: The patient was last seen in the medical oncology clinic on 01/03/2018.  At that time, he felt good.  He denied any B symptoms.  Exam was stable.  Hemoglobin was 9.8, platelets 239,000, and WBC 2200 (ANC 1300).   He saw [name] at [facility] on 05/10/2018.  Notes reviewed.  He voiced no complaints.  He is working every day.  He is building houses and inspecting houses.  Weight is down 6 pounds secondary to activity level.  He denies any concerns.  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 He took care of all his activities of daily living and also helped take care of his wife. He had no constitutional symptoms. His weight was stable. He had no abdominal pain. Appetite was good. No night sweats or pruritus. His splenomegaly was stable about 2 fingerbreadths below the left costal margin. His hepatomegaly was stable.  Hematologic picture showed slightly more prominent anemia with a hemoglobin 9.7, MCV 98, RDW 16.3, platelets 212,000, white count is 2800. Hgb in 07/2014 was 9.8 almost 4 years ago.  He was felt to have a stable hematologic picture.  Peripheral blood film revealed leukopenia, no circulating blasts, a very mild leukoerythroblastic picture with scattered nucleated red blood cells. Giant platelets were present. Red cell morphology showed numerous teardrop poikilocytes. LDH was 380 (stable).  During the interim, patient is doing well. 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 Patient works outside the majority of the time. Patient denies that he has experienced any B symptoms. He denies any interval infections. Patient denies bleeding; no hematochezia, melena, or gross hematuria. He denies any new ares of palpable adenopathy. He has not appreciated any  areas of unexplained bruising.   Patient experiencing urinary frequency x 2 weeks. He is being followed closely by his PCP for the same. He is currently taking tamsulosin for known BPH diagnosis.  He complains of arthritic pain in his LEFT knee.   Patient advises that he maintains an adequate appetite. He is eating well. He maintains adequate hydration. Weight today is 190 lb 7.6 oz (86.4 kg), which compared to his last visit to the clinic, represents a 3 pound increase.   Patient denies pain in the clinic today.   Past Medical History:  Diagnosis Date  . Arthritis   . Benign hypertensive renal disease   . GERD (gastroesophageal reflux disease)   . Heartburn   . History of retinal detachment   . Hyperlipidemia   . Hypertension   . Melanoma ([place])    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 approximately 10-15 years ago  . Myelofibrosis ([place])   . Prostate hypertrophy   . Stroke [facility]) June 2009   R brain subcortical infarct    Past Surgical History:  Procedure Laterality Date  . ASPIRATION / INJECTION RENAL CYST  Feb 2017  . BACK SURGERY     approx 20- 25 years ago  . GAS INSERTION  08/11/2011   Procedure: INSERTION OF GAS;  Surgeon: Hayden Pedro, MD;  Location: [place];  Service: Ophthalmology;  Laterality: Right;  [id number]  . SCLERAL BUCKLE  08/11/2011   Procedure: SCLERAL BUCKLE;  Surgeon: Hayden Pedro, MD;  Location: [place];  Service: Ophthalmology;  Laterality: Right;  Marland Kitchen VARICOSE VEIN SURGERY      Family History  Problem Relation Age of Onset  . Heart disease Father   . Diabetes Son   . Kidney disease Neg Hx   . Prostate cancer Neg Hx     Social History:  reports that he quit smoking about 8 years ago. His smoking use included cigarettes. He smoked 0.25 packs per day. He has never used smokeless tobacco. He reports that he does not drink alcohol or use drugs.  He works full-time as a Animator. He lives in [place].  He is alone today.  Allergies:  Allergies   Allergen Reactions  . Meloxicam Nausea And Vomiting    Current Medications: Current Outpatient Medications  Medication Sig Dispense Refill  . chlorpheniramine (CHLOR-TRIMETON) 4 MG tablet Take 4 mg by mouth 2 (two) times daily as needed for allergies.    Marland Kitchen clopidogrel (PLAVIX) 75 MG tablet TAKE 1 TABLET BY MOUTH DAILY 90 tablet 1  . diclofenac sodium (VOLTAREN) 1 % GEL Apply 4 g topically 4 (four) times daily as needed (for neck pain). 100 g 6  . gabapentin (NEURONTIN) 300 MG capsule Take 1 capsule (300 mg total) by mouth 3 (three) times daily. (Patient taking differently: Take 300 mg by mouth 2 (two) times daily. ) 270 capsule 3  . Ginsengs-Saw Palmetto (MULTI GINSENG & SAW PALMETTO) 500 MG CAPS Take 500 mg by mouth daily.    Marland Kitchen losartan (COZAAR) 100 MG tablet Take 1 tablet (100 mg total) by mouth daily. 90 tablet 1  . meclizine (ANTIVERT) 25 MG tablet TAKE 1 TABLET BY MOUTH 3 TIMES DAILY AS NEEDED FOR DIZZINESS 90 tablet 0  . Multiple Vitamin (MULITIVITAMIN WITH MINERALS) TABS Take 1 tablet by mouth daily.    . Naproxen Sodium (ALEVE) 220 MG CAPS Take by mouth as needed.     . ranitidine (ZANTAC) 150 MG tablet Take 150 mg by mouth 2 (two) times daily.    . simvastatin (ZOCOR) 40 MG tablet TAKE ONE TABLET BY MOUTH EVERY DAY 90 tablet 2  . tamsulosin (FLOMAX) 0.4 MG CAPS capsule TAKE 1 CAPSULE BY MOUTH DAILY 90 capsule 3   No current facility-administered medications for this visit.     Review of Systems:  GENERAL:  Feels good.  Active.  No fevers, sweats.  Weight gain of 3 pounds. PERFORMANCE STATUS (ECOG):  0 HEENT:  No visual changes, runny nose, sore throat, mouth sores or tenderness. Lungs: No shortness of breath or cough.  No hemoptysis. Cardiac:  No chest pain, palpitations, orthopnea, or PND. GI:  No nausea, vomiting, diarrhea, constipation, melena or hematochezia. GU:  BPH.  Urgency frequency.  No dysuria or hematuria. Musculoskeletal:  No back pain.  Arthritis left knee.  No  muscle tenderness. Extremities:  No pain or swelling. Skin:  No rashes or skin changes. Neuro:  No headache, numbness or weakness, balance or coordination issues. Endocrine:  No diabetes, thyroid issues, hot flashes or night sweats. Psych:  No mood changes, depression or anxiety. Pain:  No focal pain. Review of systems:  All other systems reviewed and found to be negative.   Physical Exam: Blood pressure 133/76, pulse 73, temperature 98 F (36.7 C), temperature source Oral, resp. rate 16, weight 190 lb 7.6 oz (86.4 kg), SpO2 97 %. GENERAL:  Well developed, well nourished, gentleman sitting comfortably in the exam room in no acute distress. MENTAL STATUS:  Alert and oriented to person, place and time. HEAD:  Short gray hair.  Normocephalic, atraumatic, face symmetric, no Cushingoid features. EYES:  Blue eyes.  Pupils equal round and reactive to light and accomodation.  No conjunctivitis or scleral icterus. ENT:  Oropharynx clear without lesion.  Tongue normal. Mucous membranes moist.  RESPIRATORY:  Clear to auscultation without rales, wheezes or rhonchi. CARDIOVASCULAR:  Regular rate and rhythm without murmur, rub or gallop. ABDOMEN:  Soft, non-tender, with active bowel sounds, and no hepatomegaly.  Spleen palpable 2 FB below left costal margin (stable).  No masses. SKIN:  No rashes, ulcers or lesions. EXTREMITIES: No edema, no skin discoloration or tenderness.  No palpable cords. LYMPH NODES: No palpable cervical, supraclavicular, axillary or inguinal adenopathy  NEUROLOGICAL: Unremarkable. PSYCH:  Appropriate.    Appointment on 07/04/2018  Component Date Value Ref Range Status  . Sodium 07/04/2018 137  135 - 145 mmol/L Final  . Potassium 07/04/2018 4.7  3.5 - 5.1 mmol/L Final  . Chloride 07/04/2018 109  98 - 111 mmol/L Final  . CO2 07/04/2018 23  22 - 32 mmol/L Final  . Glucose, Bld 07/04/2018 109* 70 - 99 mg/dL Final  . BUN 07/04/2018 23  8 - 23 mg/dL Final  . Creatinine, Ser  07/04/2018 1.43* 0.61 - 1.24 mg/dL Final  . Calcium 07/04/2018 8.4* 8.9 - 10.3 mg/dL Final  . Total Protein 07/04/2018 6.5  6.5 - 8.1 g/dL Final  . Albumin 07/04/2018 4.1  3.5 - 5.0 g/dL Final  . AST 07/04/2018 29  15 - 41 U/L Final  . ALT 07/04/2018 27  0 - 44 U/L Final  . Alkaline Phosphatase 07/04/2018 108  38 - 126 U/L Final  . Total Bilirubin 07/04/2018 1.1  0.3 - 1.2 mg/dL Final  . GFR calc non Af Amer 07/04/2018 47* >60 mL/min Final  . GFR calc Af Amer 07/04/2018 54* >60 mL/min Final  . Anion gap 07/04/2018 5  5 - 15 Final   Performed at [facility], [address], [place], [place] [address]  . LDH 07/04/2018 400* 98 - 192 U/L Final   Performed at [facility], [address], [place], [place] [address]  . WBC 07/04/2018 2.6* 4.0 - 10.5 K/uL Final  . RBC 07/04/2018 3.22* 4.22 - 5.81 MIL/uL Final  . Hemoglobin 07/04/2018 10.0* 13.0 - 17.0 g/dL Final  . HCT 07/04/2018 30.6* 39.0 - 52.0 % Final  . MCV 07/04/2018 95.0  80.0 - 100.0 fL Final  . MCH 07/04/2018 31.1  26.0 - 34.0 pg Final  . MCHC 07/04/2018 32.7  30.0 - 36.0 g/dL Final  . RDW 07/04/2018 16.9* 11.5 - 15.5 % Final  . Platelets 07/04/2018 226  150 - 400 K/uL Final  . nRBC 07/04/2018 2.3* 0.0 - 0.2 % Final  . Neutrophils Relative % 07/04/2018 56  % Final  . Neutro Abs 07/04/2018 1.4* 1.7 - 7.7 K/uL Final  . Lymphocytes Relative 07/04/2018 25  % Final  . Lymphs Abs 07/04/2018 0.7  0.7 - 4.0 K/uL Final  . Monocytes Relative 07/04/2018 14  % Final  . Monocytes Absolute 07/04/2018 0.4  0.1 - 1.0 K/uL Final  . Eosinophils Relative 07/04/2018 0  % Final  . Eosinophils Absolute 07/04/2018 0.0  0.0 - 0.5 K/uL Final  . Basophils Relative 07/04/2018 0  % Final  . Basophils Absolute 07/04/2018 0.0  0.0 - 0.1 K/uL Final  . Immature Granulocytes 07/04/2018 5  % Final  . Abs Immature Granulocytes 07/04/2018 0.14* 0.00 - 0.07 K/uL Final  Performed at [facility], [address]  [place], [place] [address]    Assessment:  [name] is a 78 y.o. 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  He presented with a long history of normocytic anemia unresponsive to oral iron.  He has mild leukopenia.  He has mild splenomegaly (15.8 cm) by abdominal ultrasound on 06/04/2015.  DIPPS score is 11 (age 89- 1, hemoglobin less than 10- 2) and score of 4 if 1% circulating blasts included from 07/17/2014.  Bone marrow on 06/11/2014 was most consistent with primary myelofibrosis.  Bone marrow biopsy showed 1% abnormal cells: CD45+, CD5+, CD10, CD11c+/-, CD19+, CD2-+, (dim), CD22+ (dim, CD23+, CD38-/+, FMC7-, HLA-DR+, sig lambda+(dim).  Blasts were not increased 1.2%; hypercellular for age: 47%; JAK2 V617F mutation was negative.  CALR mutation positive.  Flow cytometry included about 1% CLL/SLL phenotype cells (UE4+) of uncertain significance and some infiltrate into the marrow with increased atypical megakaryocytes.  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  MDS FISH panel was negative.  Labs in 05/2014 included the following normal studies: direct [place], epo level, haptoglobin, ANA, hemoglobin electrophoresis, hepatitis B surface antigen, HIV testing, UPEP.  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  LDH was elevated (520). Hepatitis C by PCR was less than 2 IU/ml.   Symptomatically, he denies any B symptoms or early satiety.  He has urinary frequency.  Exam reveals stable splenomegaly.  Hemoglobin is 10.0, platelets 226,000, WBC 2600 (ANC 1400).   Plan: 1.   Review interval labs and notes from Dr. Dolan Amen office. 2.   Labs today:  CBC with diff, CMP, LDH, ferritin. 3.   Primary myelofibrosis  Counts remain stable.  Patient asymptomatic.  Continue surveillance. 4.   Urinary frequency  Urinalysis and culture. 5.   Continue fever and neutropenia precautions.  6.   Continue alternating follow-up at [facility].  Patient seen every 3  months. 7.   RTC in 6 months for MD assessment and labs (CBC with diff, CMP, LDH).     Honor Loh, NP  07/04/2018, 3:46 PM   I saw and evaluated the patient, participating in the key portions of the service and reviewing pertinent diagnostic studies and records.  I reviewed the nurse practitioner's note and agree with the findings and the plan.  The assessment and plan were discussed with the patient.  A few questions were asked by the patient and answered.   Nolon Stalls, MD 07/04/2018,3:46 PM

## 2018-07-06 LAB — URINE CULTURE: Culture: NO GROWTH

## 2018-07-11 DIAGNOSIS — M9903 Segmental and somatic dysfunction of lumbar region: Secondary | ICD-10-CM | POA: Diagnosis not present

## 2018-07-11 DIAGNOSIS — M62830 Muscle spasm of back: Secondary | ICD-10-CM | POA: Diagnosis not present

## 2018-07-11 DIAGNOSIS — M5033 Other cervical disc degeneration, cervicothoracic region: Secondary | ICD-10-CM | POA: Diagnosis not present

## 2018-07-11 DIAGNOSIS — M9901 Segmental and somatic dysfunction of cervical region: Secondary | ICD-10-CM | POA: Diagnosis not present

## 2018-07-13 ENCOUNTER — Other Ambulatory Visit: Payer: Self-pay | Admitting: Family Medicine

## 2018-07-13 NOTE — Telephone Encounter (Signed)
Requested medication (s) are due for refill today: Yes  Requested medication (s) are on the active medication list: Yes  Last refill:  04/18/18  Future visit scheduled: Yes  Notes to clinic:  Unable to refill, not delegated     Requested Prescriptions  Pending Prescriptions Disp Refills   meclizine (ANTIVERT) 25 MG tablet [Pharmacy Med Name: MECLIZINE HCL 25 MG TAB] 90 tablet 0    Sig: TAKE ONE TABLET 3 TIMES DAILY AS NEEDED FOR DIZZINESS     Not Delegated - Gastroenterology: Antiemetics Failed - 07/13/2018  9:03 AM      Failed - This refill cannot be delegated      Passed - Valid encounter within last 6 months    Recent Outpatient Visits          4 months ago Benign hypertensive renal disease   [place], Megan P, DO   7 months ago Acute non-recurrent frontal sinusitis   [facility] Carles Collet M, PA-C   1 year ago Chronic right shoulder pain   [place], [place], DO   1 year ago Benign hypertensive renal disease   [facility] [place], [place], DO   2 years ago Benign hypertensive renal disease   [facility] [place], [place], DO      Future Appointments            In 1 month Johnson, Barb Merino, DO [place], [place]   In 6 months  [employer], [place]

## 2018-07-25 DIAGNOSIS — M5033 Other cervical disc degeneration, cervicothoracic region: Secondary | ICD-10-CM | POA: Diagnosis not present

## 2018-07-25 DIAGNOSIS — M9903 Segmental and somatic dysfunction of lumbar region: Secondary | ICD-10-CM | POA: Diagnosis not present

## 2018-07-25 DIAGNOSIS — M9901 Segmental and somatic dysfunction of cervical region: Secondary | ICD-10-CM | POA: Diagnosis not present

## 2018-07-25 DIAGNOSIS — M62830 Muscle spasm of back: Secondary | ICD-10-CM | POA: Diagnosis not present

## 2018-08-03 ENCOUNTER — Other Ambulatory Visit: Payer: Self-pay | Admitting: Family Medicine

## 2018-08-08 ENCOUNTER — Other Ambulatory Visit: Payer: Self-pay | Admitting: Family Medicine

## 2018-08-08 DIAGNOSIS — M9901 Segmental and somatic dysfunction of cervical region: Secondary | ICD-10-CM | POA: Diagnosis not present

## 2018-08-08 DIAGNOSIS — M62830 Muscle spasm of back: Secondary | ICD-10-CM | POA: Diagnosis not present

## 2018-08-08 DIAGNOSIS — M9903 Segmental and somatic dysfunction of lumbar region: Secondary | ICD-10-CM | POA: Diagnosis not present

## 2018-08-08 DIAGNOSIS — M5033 Other cervical disc degeneration, cervicothoracic region: Secondary | ICD-10-CM | POA: Diagnosis not present

## 2018-08-16 ENCOUNTER — Other Ambulatory Visit: Payer: Self-pay | Admitting: Family Medicine

## 2018-08-22 DIAGNOSIS — M9901 Segmental and somatic dysfunction of cervical region: Secondary | ICD-10-CM | POA: Diagnosis not present

## 2018-08-22 DIAGNOSIS — M5033 Other cervical disc degeneration, cervicothoracic region: Secondary | ICD-10-CM | POA: Diagnosis not present

## 2018-08-22 DIAGNOSIS — M9903 Segmental and somatic dysfunction of lumbar region: Secondary | ICD-10-CM | POA: Diagnosis not present

## 2018-08-22 DIAGNOSIS — M62830 Muscle spasm of back: Secondary | ICD-10-CM | POA: Diagnosis not present

## 2018-09-03 ENCOUNTER — Ambulatory Visit (INDEPENDENT_AMBULATORY_CARE_PROVIDER_SITE_OTHER): Payer: Medicare Other | Admitting: Family Medicine

## 2018-09-03 ENCOUNTER — Encounter: Payer: Self-pay | Admitting: Family Medicine

## 2018-09-03 VITALS — BP 132/77 | HR 64

## 2018-09-03 DIAGNOSIS — D7581 Myelofibrosis: Secondary | ICD-10-CM | POA: Diagnosis not present

## 2018-09-03 DIAGNOSIS — I129 Hypertensive chronic kidney disease with stage 1 through stage 4 chronic kidney disease, or unspecified chronic kidney disease: Secondary | ICD-10-CM | POA: Diagnosis not present

## 2018-09-03 DIAGNOSIS — E782 Mixed hyperlipidemia: Secondary | ICD-10-CM | POA: Diagnosis not present

## 2018-09-03 DIAGNOSIS — D474 Osteomyelofibrosis: Secondary | ICD-10-CM | POA: Diagnosis not present

## 2018-09-03 MED ORDER — TAMSULOSIN HCL 0.4 MG PO CAPS: 0.4000 mg | ORAL_CAPSULE | Freq: Every day | ORAL | 3 refills | Status: DC

## 2018-09-03 MED ORDER — CLOPIDOGREL BISULFATE 75 MG PO TABS: 75.0000 mg | ORAL_TABLET | Freq: Every day | ORAL | 3 refills | Status: DC

## 2018-09-03 MED ORDER — SIMVASTATIN 40 MG PO TABS: ORAL_TABLET | ORAL | 1 refills | Status: DC

## 2018-09-03 MED ORDER — GABAPENTIN 300 MG PO CAPS: 300.0000 mg | ORAL_CAPSULE | Freq: Three times a day (TID) | ORAL | 1 refills | Status: DC

## 2018-09-03 MED ORDER — LOSARTAN POTASSIUM 100 MG PO TABS: 100.0000 mg | ORAL_TABLET | Freq: Every day | ORAL | 1 refills | Status: DC

## 2018-09-03 NOTE — Assessment & Plan Note (Signed)
Stable. Continue to follow with hematology. Call with any concerns. Rechecking CBC.

## 2018-09-03 NOTE — Assessment & Plan Note (Signed)
Under good control on current regimen. Continue current regimen. Continue to monitor. Call with any concerns. Refills given. Labs drawn today.   

## 2018-09-03 NOTE — Progress Notes (Signed)
BP 132/77 Comment: patient reported- virtual visit  Pulse 64 Comment: patient reported- virtual visit   Subjective:    Patient ID: [name], male    DOB: 10-26-1940, 78 y.o.   MRN: [id number]  HPI: [name] is a 78 y.o. male  Chief Complaint  Patient presents with  . Follow-up  . Hypertension  . Hyperlipidemia  . TELEMEDICINE VISIT   HYPERTENSION / HYPERLIPIDEMIA Satisfied with current treatment? yes Duration of hypertension: chronic BP monitoring frequency: not checking BP medication side effects: no Past BP meds: losartan Duration of hyperlipidemia: chronic Cholesterol medication side effects: no Cholesterol supplements: none Past cholesterol medications: simvastatin Medication compliance: excellent compliance Aspirin: no Recent stressors: no Recurrent headaches: no Visual changes: no Palpitations: no Dyspnea: no Chest pain: no Lower extremity edema: no Dizzy/lightheaded: no  ANEMIA/MYLOFIBROSIS Anemia status: stable Compliance with treatment: good compliance Severity of anemia: mild Fatigue: no Decreased exercise tolerance: no  Dyspnea on exertion: no Palpitations: no Bleeding: no Pica: no  Relevant past medical, surgical, family and social history reviewed and updated as indicated. Interim medical history since our last visit reviewed. Allergies and medications reviewed and updated.  Review of Systems  Constitutional: Negative.   HENT: Negative.   Respiratory: Negative.   Cardiovascular: Negative.   Musculoskeletal: Positive for arthralgias (R shoulder pain).  Neurological: Negative.   Psychiatric/Behavioral: Negative.     Per HPI unless specifically indicated above     Objective:    BP 132/77 Comment: patient reported- virtual visit  Pulse 64 Comment: patient reported- virtual visit  Wt Readings from Last 3 Encounters:  07/04/18 190 lb 7.6 oz (86.4 kg)  03/05/18 192 lb 5 oz (87.2 kg)  01/24/18 185 lb 6.4 oz (84.1 kg)     Physical Exam Constitutional:      General: He is not in acute distress.    Appearance: Normal appearance. He is well-developed. He is obese. He is not ill-appearing, toxic-appearing or diaphoretic.  HENT:     Head: Normocephalic and atraumatic.     Right Ear: Hearing and external ear normal.     Left Ear: Hearing and external ear normal.     Nose: Nose normal. No congestion or rhinorrhea.     Mouth/Throat:     Mouth: Mucous membranes are moist.     Pharynx: No oropharyngeal exudate or posterior oropharyngeal erythema.  Eyes:     General: Lids are normal. No scleral icterus.       Right eye: No discharge.        Left eye: No discharge.     Extraocular Movements: Extraocular movements intact.     Conjunctiva/sclera: Conjunctivae normal.     Pupils: Pupils are equal, round, and reactive to light.  Neck:     Musculoskeletal: Normal range of motion.  Pulmonary:     Effort: Pulmonary effort is normal. No respiratory distress.     Comments: Able to speak in complete sentences, no SOB Musculoskeletal: Normal range of motion.  Skin:    Coloration: Skin is not jaundiced or pale.     Findings: No bruising, erythema, lesion or rash.  Neurological:     General: No focal deficit present.     Mental Status: He is alert and oriented to person, place, and time.  Psychiatric:        Mood and Affect: Mood normal.        Speech: Speech normal.        Behavior: Behavior normal.  Thought Content: Thought content normal.        Judgment: Judgment normal.     Results for orders placed or performed in visit on 07/04/18  Urine Culture  Result Value Ref Range   Specimen Description URINE, RANDOM    Special Requests      NONE Performed at [facility], [address] [place], [place] [address]    Culture NO GROWTH    Report Status 07/06/2018 FINAL   Ferritin  Result Value Ref Range   Ferritin 253 24 - 336 ng/mL  Comprehensive metabolic panel  Result Value Ref Range    Sodium 137 135 - 145 mmol/L   Potassium 4.7 3.5 - 5.1 mmol/L   Chloride 109 98 - 111 mmol/L   CO2 23 22 - 32 mmol/L   Glucose, Bld 109 (H) 70 - 99 mg/dL   BUN 23 8 - 23 mg/dL   Creatinine, Ser 1.43 (H) 0.61 - 1.24 mg/dL   Calcium 8.4 (L) 8.9 - 10.3 mg/dL   Total Protein 6.5 6.5 - 8.1 g/dL   Albumin 4.1 3.5 - 5.0 g/dL   AST 29 15 - 41 U/L   ALT 27 0 - 44 U/L   Alkaline Phosphatase 108 38 - 126 U/L   Total Bilirubin 1.1 0.3 - 1.2 mg/dL   GFR calc non Af Amer 47 (L) >60 mL/min   GFR calc Af Amer 54 (L) >60 mL/min   Anion gap 5 5 - 15  Lactate dehydrogenase  Result Value Ref Range   LDH 400 (H) 98 - 192 U/L  CBC with Differential  Result Value Ref Range   WBC 2.6 (L) 4.0 - 10.5 K/uL   RBC 3.22 (L) 4.22 - 5.81 MIL/uL   Hemoglobin 10.0 (L) 13.0 - 17.0 g/dL   HCT 30.6 (L) 39.0 - 52.0 %   MCV 95.0 80.0 - 100.0 fL   MCH 31.1 26.0 - 34.0 pg   MCHC 32.7 30.0 - 36.0 g/dL   RDW 16.9 (H) 11.5 - 15.5 %   Platelets 226 150 - 400 K/uL   nRBC 2.3 (H) 0.0 - 0.2 %   Neutrophils Relative % 56 %   Neutro Abs 1.4 (L) 1.7 - 7.7 K/uL   Lymphocytes Relative 25 %   Lymphs Abs 0.7 0.7 - 4.0 K/uL   Monocytes Relative 14 %   Monocytes Absolute 0.4 0.1 - 1.0 K/uL   Eosinophils Relative 0 %   Eosinophils Absolute 0.0 0.0 - 0.5 K/uL   Basophils Relative 0 %   Basophils Absolute 0.0 0.0 - 0.1 K/uL   Immature Granulocytes 5 %   Abs Immature Granulocytes 0.14 (H) 0.00 - 0.07 K/uL  Urinalysis, Complete w Microscopic  Result Value Ref Range   Color, Urine YELLOW YELLOW   APPearance CLEAR CLEAR   Specific Gravity, Urine 1.025 1.005 - 1.030   pH 6.0 5.0 - 8.0   Glucose, UA NEGATIVE NEGATIVE mg/dL   Hgb urine dipstick NEGATIVE NEGATIVE   Bilirubin Urine NEGATIVE NEGATIVE   Ketones, ur NEGATIVE NEGATIVE mg/dL   Protein, ur >300 (A) NEGATIVE mg/dL   Nitrite NEGATIVE NEGATIVE   Leukocytes, UA NEGATIVE NEGATIVE   Squamous Epithelial / LPF NONE SEEN 0 - 5   WBC, UA NONE SEEN 0 - 5 WBC/hpf   RBC / HPF  0-5 0 - 5 RBC/hpf   Bacteria, UA NONE SEEN NONE SEEN      Assessment & Plan:   Problem List Items Addressed This Visit  Genitourinary   Benign hypertensive renal disease - Primary    Under good control on current regimen. Continue current regimen. Continue to monitor. Call with any concerns. Refills given. Labs drawn today.       Relevant Orders   Comprehensive metabolic panel   Microalbumin, Urine Waived   TSH   UA/M w/rflx Culture, Routine     Other   Myelofibrosis (HCC)    Stable. Continue to follow with hematology. Call with any concerns. Rechecking CBC.       Relevant Orders   Comprehensive metabolic panel   CBC with Differential/Platelet   TSH   UA/M w/rflx Culture, Routine   Myelosclerosis with myeloid metaplasia (HCC)    Stable. Continue to follow with hematology. Call with any concerns. Rechecking CBC.       Relevant Orders   Comprehensive metabolic panel   CBC with Differential/Platelet   TSH   UA/M w/rflx Culture, Routine   Mixed hyperlipidemia    Under good control on current regimen. Continue current regimen. Continue to monitor. Call with any concerns. Refills given. Labs drawn today.       Relevant Medications   losartan (COZAAR) 100 MG tablet   simvastatin (ZOCOR) 40 MG tablet   Other Relevant Orders   Comprehensive metabolic panel   Lipid Panel w/o Chol/HDL Ratio   TSH   UA/M w/rflx Culture, Routine       Follow up plan: Return in about 6 months (around 03/06/2019) for Follow up/wellness.    . This visit was completed via FaceTime due to the restrictions of the COVID-19 pandemic. All issues as above were discussed and addressed. Physical exam was done as above through visual confirmation on FaceTime. If it was felt that the patient should be evaluated in the office, they were directed there. The patient verbally consented to this visit. . Location of the patient: home . Location of the provider: home . Those involved with this call:   . Provider: Park Liter, DO . CMA: Yvonna Alanis, [place] . Front Desk/Registration: Don Perking  . Time spent on call: 25 minutes with patient face to face via video conference. More than 50% of this time was spent in counseling and coordination of care.

## 2018-09-05 ENCOUNTER — Other Ambulatory Visit: Payer: Self-pay

## 2018-09-05 ENCOUNTER — Other Ambulatory Visit: Payer: Medicare Other

## 2018-09-05 DIAGNOSIS — D474 Osteomyelofibrosis: Secondary | ICD-10-CM

## 2018-09-05 DIAGNOSIS — I129 Hypertensive chronic kidney disease with stage 1 through stage 4 chronic kidney disease, or unspecified chronic kidney disease: Secondary | ICD-10-CM | POA: Diagnosis not present

## 2018-09-05 DIAGNOSIS — E782 Mixed hyperlipidemia: Secondary | ICD-10-CM | POA: Diagnosis not present

## 2018-09-05 DIAGNOSIS — D7581 Myelofibrosis: Secondary | ICD-10-CM | POA: Diagnosis not present

## 2018-09-05 LAB — MICROALBUMIN, URINE WAIVED
Creatinine, Urine Waived: 200 mg/dL (ref 10–300)
Microalb, Ur Waived: 150 mg/L — ABNORMAL HIGH (ref 0–19)
Microalb/Creat Ratio: >300 mg/g — ABNORMAL HIGH (ref <30)

## 2018-09-05 LAB — UA/M W/RFLX CULTURE, ROUTINE
Bilirubin, UA: NEGATIVE
Glucose, UA: NEGATIVE
Ketones, UA: NEGATIVE
Leukocytes,UA: NEGATIVE
Nitrite, UA: NEGATIVE
RBC, UA: NEGATIVE
Specific Gravity, UA: 1.025 (ref 1.005–1.030)
Urobilinogen, Ur: 0.2 mg/dL (ref 0.2–1.0)
pH, UA: 5 (ref 5.0–7.5)

## 2018-09-05 LAB — MICROSCOPIC EXAMINATION
Bacteria, UA: NONE SEEN
RBC: NONE SEEN /hpf (ref 0–2)
WBC, UA: NONE SEEN /hpf (ref 0–5)

## 2018-09-06 ENCOUNTER — Encounter: Payer: Self-pay | Admitting: Family Medicine

## 2018-09-06 LAB — COMPREHENSIVE METABOLIC PANEL
ALT: 26 IU/L (ref 0–44)
AST: 28 IU/L (ref 0–40)
Albumin/Globulin Ratio: 2.4 — ABNORMAL HIGH (ref 1.2–2.2)
Albumin: 4.3 g/dL (ref 3.7–4.7)
Alkaline Phosphatase: 107 IU/L (ref 39–117)
BUN/Creatinine Ratio: 24 (ref 10–24)
BUN: 28 mg/dL — ABNORMAL HIGH (ref 8–27)
Bilirubin Total: 1.1 mg/dL (ref 0.0–1.2)
CO2: 24 mmol/L (ref 20–29)
Calcium: 9.1 mg/dL (ref 8.6–10.2)
Chloride: 106 mmol/L (ref 96–106)
Creatinine, Ser: 1.19 mg/dL (ref 0.76–1.27)
GFR calc Af Amer: 67 mL/min/{1.73_m2} (ref >59)
GFR calc non Af Amer: 58 mL/min/{1.73_m2} — ABNORMAL LOW (ref >59)
Globulin, Total: 1.8 g/dL (ref 1.5–4.5)
Glucose: 91 mg/dL (ref 65–99)
Potassium: 5.3 mmol/L — ABNORMAL HIGH (ref 3.5–5.2)
Sodium: 142 mmol/L (ref 134–144)
Total Protein: 6.1 g/dL (ref 6.0–8.5)

## 2018-09-06 LAB — CBC WITH DIFFERENTIAL/PLATELET
Basophils Absolute: 0 10*3/uL (ref 0.0–0.2)
Basos: 1 %
EOS (ABSOLUTE): 0 10*3/uL (ref 0.0–0.4)
Eos: 0 %
Hematocrit: 29.1 % — ABNORMAL LOW (ref 37.5–51.0)
Hemoglobin: 9.7 g/dL — ABNORMAL LOW (ref 13.0–17.7)
Lymphocytes Absolute: 0.7 10*3/uL (ref 0.7–3.1)
Lymphs: 25 %
MCH: 30.5 pg (ref 26.6–33.0)
MCHC: 33.3 g/dL (ref 31.5–35.7)
MCV: 92 fL (ref 79–97)
Monocytes Absolute: 0.2 10*3/uL (ref 0.1–0.9)
Monocytes: 6 %
NRBC: 3 % — ABNORMAL HIGH (ref 0–0)
Neutrophils Absolute: 1.7 10*3/uL (ref 1.4–7.0)
Neutrophils: 66 %
Platelets: 231 10*3/uL (ref 150–450)
RBC: 3.18 x10E6/uL — ABNORMAL LOW (ref 4.14–5.80)
RDW: 15.6 % — ABNORMAL HIGH (ref 11.6–15.4)
WBC: 2.6 10*3/uL — ABNORMAL LOW (ref 3.4–10.8)

## 2018-09-06 LAB — LIPID PANEL W/O CHOL/HDL RATIO
Cholesterol, Total: 101 mg/dL (ref 100–199)
HDL: 30 mg/dL — ABNORMAL LOW (ref >39)
LDL Calculated: 51 mg/dL (ref 0–99)
Triglycerides: 98 mg/dL (ref 0–149)
VLDL Cholesterol Cal: 20 mg/dL (ref 5–40)

## 2018-09-06 LAB — IMMATURE CELLS: Metamyelocytes: 2 % — ABNORMAL HIGH (ref 0–0)

## 2018-09-06 LAB — TSH: TSH: 3.71 u[IU]/mL (ref 0.450–4.500)

## 2018-09-27 ENCOUNTER — Telehealth: Payer: Self-pay

## 2018-09-27 NOTE — Telephone Encounter (Signed)
Virtual Visit Pre-Appointment Phone Call  "(Name), I am calling you today to discuss your upcoming appointment. We are currently trying to limit exposure to the virus that causes COVID-19 by seeing patients at home rather than in the office."  1. "What is the BEST phone number to call the day of the visit?" - include this in appointment notes  2. "Do you have or have access to (through a family member/friend) a smartphone with video capability that we can use for your visit?" a. If yes - list this number in appt notes as "cell" (if different from BEST phone #) and list the appointment type as a VIDEO visit in appointment notes b. If no - list the appointment type as a PHONE visit in appointment notes  3. Confirm consent - "In the setting of the current Covid19 crisis, you are scheduled for a video visit with your provider on October 03, 2018 at 1:20PM.  Just as we do with many in-office visits, in order for you to participate in this visit, we must obtain consent.  If you'd like, I can send this to your mychart (if signed up) or email for you to review.  Otherwise, I can obtain your verbal consent now.  All virtual visits are billed to your insurance company just like a normal visit would be.  By agreeing to a virtual visit, we'd like you to understand that the technology does not allow for your provider to perform an examination, and thus may limit your provider's ability to fully assess your condition. If your provider identifies any concerns that need to be evaluated in person, we will make arrangements to do so.  Finally, though the technology is pretty good, we cannot assure that it will always work on either your or our end, and in the setting of a video visit, we may have to convert it to a phone-only visit.  In either situation, we cannot ensure that we have a secure connection.  Are you willing to proceed?" STAFF: Did the patient verbally acknowledge consent to telehealth visit? Document  YES/NO here: YES  4. Advise patient to be prepared - "Two hours prior to your appointment, go ahead and check your blood pressure, pulse, oxygen saturation, and your weight (if you have the equipment to check those) and write them all down. When your visit starts, your provider will ask you for this information. If you have an Apple Watch or Kardia device, please plan to have heart rate information ready on the day of your appointment. Please have a pen and paper handy nearby the day of the visit as well."  5. Give patient instructions for MyChart download to smartphone OR [email or web address] as below if video visit (depending on what platform provider is using)  6. Inform patient they will receive a phone call 15 minutes prior to their appointment time (may be from unknown caller ID) so they should be prepared to answer    TELEPHONE CALL NOTE  PAVLE WILER has been deemed a candidate for a follow-up tele-health visit to limit community exposure during the Covid-19 pandemic. I spoke with the patient via phone to ensure availability of phone/video source, confirm preferred email & phone number, and discuss instructions and expectations.  I reminded [name] to be prepared with any vital sign and/or heart rhythm information that could potentially be obtained via home monitoring, at the time of his visit. I reminded [name] to expect a phone call prior to  his visit.  Horton Finer 09/27/2018 2:39 PM   INSTRUCTIONS FOR DOWNLOADING THE MYCHART APP TO SMARTPHONE  - The patient must first make sure to have activated MyChart and know their login information - If Apple, go to [employer] and type in MyChart in the search bar and download the app. If Android, ask patient to go to [employer] and type in [place] in the search bar and download the app. The app is free but as with any other app downloads, their phone may require them to verify saved payment information or  Apple/Android password.  - The patient will need to then log into the app with their MyChart username and password, and select [facility] as their healthcare provider to link the account. When it is time for your visit, go to the MyChart app, find appointments, and click Begin Video Visit. Be sure to Select Allow for your device to access the Microphone and Camera for your visit. You will then be connected, and your provider will be with you shortly.  **If they have any issues connecting, or need assistance please contact MyChart service desk [phone])**  **If using a computer, in order to ensure the best quality for their visit they will need to use either of the following Internet Browsers: [employer], or Google Chrome**  IF USING DOXIMITY or DOXY.ME - The patient will receive a link just prior to their visit by text.     FULL LENGTH CONSENT FOR TELE-HEALTH VISIT   I hereby voluntarily request, consent and authorize [facility] and its employed or contracted physicians, physician assistants, nurse practitioners or other licensed health care professionals (the Practitioner), to provide me with telemedicine health care services (the "Services") as deemed necessary by the treating Practitioner. I acknowledge and consent to receive the Services by the Practitioner via telemedicine. I understand that the telemedicine visit will involve communicating with the Practitioner through live audiovisual communication technology and the disclosure of certain medical information by electronic transmission. I acknowledge that I have been given the opportunity to request an in-person assessment or other available alternative prior to the telemedicine visit and am voluntarily participating in the telemedicine visit.  I understand that I have the right to withhold or withdraw my consent to the use of telemedicine in the course of my care at any time, without affecting my right to future care  or treatment, and that the Practitioner or I may terminate the telemedicine visit at any time. I understand that I have the right to inspect all information obtained and/or recorded in the course of the telemedicine visit and may receive copies of available information for a reasonable fee.  I understand that some of the potential risks of receiving the Services via telemedicine include:  Marland Kitchen Delay or interruption in medical evaluation due to technological equipment failure or disruption; . Information transmitted may not be sufficient (e.g. poor resolution of images) to allow for appropriate medical decision making by the Practitioner; and/or  . In rare instances, security protocols could fail, causing a breach of personal health information.  Furthermore, I acknowledge that it is my responsibility to provide information about my medical history, conditions and care that is complete and accurate to the best of my ability. I acknowledge that Practitioner's advice, recommendations, and/or decision may be based on factors not within their control, such as incomplete or inaccurate data provided by me or distortions of diagnostic images or specimens that may result from electronic transmissions.  I understand that the practice of medicine is not an exact science and that Practitioner makes no warranties or guarantees regarding treatment outcomes. I acknowledge that I will receive a copy of this consent concurrently upon execution via email to the email address I last provided but may also request a printed copy by calling the office of [facility].    I understand that my insurance will be billed for this visit.   I have read or had this consent read to me. . I understand the contents of this consent, which adequately explains the benefits and risks of the Services being provided via telemedicine.  . I have been provided ample opportunity to ask questions regarding this consent and the Services and have had  my questions answered to my satisfaction. . I give my informed consent for the services to be provided through the use of telemedicine in my medical care  By participating in this telemedicine visit I agree to the above.

## 2018-10-02 NOTE — Progress Notes (Signed)
Virtual Visit via Video Note   This visit type was conducted due to national recommendations for restrictions regarding the COVID-19 Pandemic (e.g. social distancing) in an effort to limit this patient's exposure and mitigate transmission in our community.  Due to his co-morbid illnesses, this patient is at least at moderate risk for complications without adequate follow up.  This format is felt to be most appropriate for this patient at this time.  All issues noted in this document were discussed and addressed.  A limited physical exam was performed with this format.  Please refer to the patient's chart for his consent to telehealth for [facility]   I connected with  [name] on 10/03/18 by a video enabled telemedicine application and verified that I am speaking with the correct person using two identifiers. I discussed the limitations of evaluation and management by telemedicine. The patient expressed understanding and agreed to proceed.   Evaluation Performed:  Follow-up visit  Date:  10/03/2018   ID:  [name], DOB 19-Oct-1940, MRN [id number]  Patient Location:  [place] [address]   Provider location:   [facility] [place] office  PCP:  [name], DO  Cardiologist:  [name]   Chief Complaint:  vertigo   History of Present Illness:    [name] is a 78 y.o. male who presents via audio/video conferencing for a telehealth visit today.   The patient does not symptoms concerning for COVID-19 infection (fever, chills, cough, or new SHORTNESS OF BREATH).   Patient has a past medical history of myelofibrosis diagnosed 06/11/2014.  long history of normocytic anemia unresponsive to oral iron. 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 Arthritis   . Benign hypertensive renal disease   . GERD (gastroesophageal reflux disease)   . Heartburn   . History of retinal detachment   . Hyperlipidemia   . Hypertension   . Melanoma ([place])    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 approximately 10-15 years ago  . Myelofibrosis ([place])   . Prostate hypertrophy   . Stroke [facility]) June 2009   R brain subcortical infarct   Past Surgical History:  Procedure Laterality Date  . ASPIRATION / INJECTION RENAL CYST  Feb 2017  . BACK SURGERY     approx 20- 25 years ago  . GAS INSERTION  08/11/2011   Procedure: INSERTION OF GAS;  Surgeon: Hayden Pedro, MD;  Location: [place];  Service: Ophthalmology;  Laterality: Right;  C3F8  . SCLERAL BUCKLE  08/11/2011   Procedure: SCLERAL BUCKLE;  Surgeon: Hayden Pedro, MD;  Location: [place];  Service: Ophthalmology;  Laterality: Right;  Marland Kitchen VARICOSE VEIN SURGERY       No outpatient medications have been marked as taking for the 10/03/18 encounter (Appointment) with Minna Merritts, MD.     Allergies:   Meloxicam   Social History   Tobacco Use  . Smoking status: Former Smoker    Packs/day: 0.25    Types: Cigarettes    Last attempt  to quit: 06/06/2010    Years since quitting: 8.3  . Smokeless tobacco: Never Used  . Tobacco comment: quit 20 years  Substance Use Topics  . Alcohol use: No    Alcohol/week: 0.0 standard drinks  . Drug use: No     Current Outpatient Medications on File Prior to Visit  Medication Sig Dispense Refill  . chlorpheniramine (CHLOR-TRIMETON) 4 MG tablet Take 4 mg by mouth 2 (two) times daily as needed for allergies.    Marland Kitchen clopidogrel (PLAVIX) 75 MG tablet Take 1 tablet (75 mg total) by mouth daily. 90 tablet 3  . diclofenac sodium (VOLTAREN) 1 % GEL Apply 4 g topically 4 (four) times daily as needed (for neck pain). 100 g 6  . gabapentin (NEURONTIN) 300 MG capsule Take 1 capsule (300 mg total) by mouth 3 (three) times daily. 270 capsule 1  . Ginsengs-Saw Palmetto (MULTI GINSENG & SAW PALMETTO) 500 MG CAPS Take 500 mg by mouth daily.    Marland Kitchen losartan (COZAAR) 100 MG tablet Take 1 tablet (100 mg total) by mouth daily. 90 tablet 1  . meclizine (ANTIVERT) 25 MG tablet TAKE ONE TABLET 3 TIMES DAILY AS NEEDED FOR DIZZINESS 90 tablet 0  . Multiple Vitamin (MULITIVITAMIN WITH MINERALS) TABS Take 1 tablet by mouth daily.    . Naproxen Sodium (ALEVE) 220 MG CAPS Take by mouth as needed.     . pantoprazole (PROTONIX) 20 MG tablet Take 20 mg by mouth daily.    . ranitidine (ZANTAC) 150 MG tablet Take 150 mg by mouth 2 (two) times daily.    . simvastatin (ZOCOR) 40 MG tablet TAKE ONE TABLET BY MOUTH EVERY DAY 90 tablet 1  . tamsulosin (FLOMAX) 0.4 MG CAPS capsule Take 1 capsule (0.4 mg total) by mouth daily. 90 capsule 3   No current facility-administered medications on file prior to visit.      Family Hx: The patient's family history includes Diabetes in his son; Heart disease in his father. There is no history of Kidney disease or Prostate cancer.  ROS:   Please see the history of present illness.    Review of Systems  Constitutional: Negative.   HENT: Negative.   Respiratory: Negative.    Cardiovascular: Negative.   Gastrointestinal: Negative.   Musculoskeletal: Negative.   Neurological: Negative.   Psychiatric/Behavioral: Negative.   All other systems reviewed and are negative.     Labs/Other Tests and Data Reviewed:    Recent Labs: 09/05/2018: ALT 26; BUN 28; Creatinine, Ser 1.19; Hemoglobin 9.7; Platelets 231; Potassium 5.3; Sodium 142; TSH 3.710   Recent Lipid Panel Lab Results  Component Value Date/Time   CHOL 101 09/05/2018 02:34 PM   CHOL 106 04/14/2016 08:58 AM   TRIG 98 09/05/2018 02:34 PM   TRIG 99 04/14/2016 08:58 AM   HDL 30 (L) 09/05/2018 02:34 PM   CHOLHDL 2.8 12/10/2012 03:22 PM   CHOLHDL 6.4 11/21/2007 03:25 AM   LDLCALC 51 09/05/2018 02:34 PM    Wt Readings from Last 3 Encounters:  07/04/18 190 lb 7.6 oz (86.4 kg)  03/05/18 192 lb 5 oz (87.2 kg)  01/24/18 185 lb 6.4 oz (84.1 kg)     Exam:    Vital Signs: Vital signs may also be detailed in the HPI There were no vitals taken for this visit.  Wt Readings from Last 3 Encounters:  07/04/18 190 lb 7.6 oz (86.4 kg)  03/05/18 192 lb 5 oz (87.2 kg)  01/24/18 185 lb 6.4 oz (84.1 kg)   Temp Readings from Last 3 Encounters:  07/04/18 98 F (36.7 C) (Oral)  01/24/18 98.4 F (36.9 C) (Temporal)  01/03/18 98.2 F (36.8 C) (Tympanic)   BP Readings from Last 3 Encounters:  09/03/18 132/77  07/04/18 133/76  03/05/18 137/75   Pulse Readings from Last 3 Encounters:  09/03/18 64  07/04/18 73  03/05/18 (!) 70     Well nourished, well developed male in no acute distress. Constitutional:  oriented to person, place, and time. No distress.  Head: Normocephalic and atraumatic.  Eyes:  no discharge. No scleral icterus.  Neck: Normal range of motion. Neck supple.  Pulmonary/Chest: No audible wheezing, no distress, appears comfortable Musculoskeletal: Normal range of motion.  no  tenderness or deformity.  Neurological:   Coordination normal. Full exam not performed Skin:  No rash Psychiatric:   normal mood and affect. behavior is normal. Thought content normal.    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 No changes to the medications were made.  Chronic anemia Managed by hematology Stable, is not symptomatic    COVID-19 Education: The signs and symptoms of COVID-19 were discussed with the patient and how to seek care for testing (follow up with PCP or arrange E-visit).  The importance of social distancing was discussed today.  Patient Risk:   After full review of this patients clinical status, I feel that they are at least moderate risk at this time.  Time:   Today, I have spent 25 minutes with the patient with telehealth technology discussing the cardiac and medical problems/diagnoses detailed above   10 min spent reviewing the chart prior to patient visit today   Medication Adjustments/Labs and Tests Ordered: Current medicines are reviewed at length with the patient today.  Concerns regarding medicines are outlined above.   Tests Ordered: No tests ordered   Medication Changes: No changes made   Disposition: Follow-up in 1 year   Signed, Ida Rogue, MD  10/03/2018 1:35 PM    [place] Office [address], [place], [place] [address]

## 2018-10-03 ENCOUNTER — Telehealth (INDEPENDENT_AMBULATORY_CARE_PROVIDER_SITE_OTHER): Payer: Medicare Other | Admitting: Cardiovascular Disease

## 2018-10-03 ENCOUNTER — Other Ambulatory Visit: Payer: Self-pay

## 2018-10-03 DIAGNOSIS — I739 Peripheral vascular disease, unspecified: Secondary | ICD-10-CM

## 2018-10-03 DIAGNOSIS — I951 Orthostatic hypotension: Secondary | ICD-10-CM | POA: Diagnosis not present

## 2018-10-03 DIAGNOSIS — E782 Mixed hyperlipidemia: Secondary | ICD-10-CM

## 2018-10-03 DIAGNOSIS — I639 Cerebral infarction, unspecified: Secondary | ICD-10-CM

## 2018-10-03 DIAGNOSIS — D649 Anemia, unspecified: Secondary | ICD-10-CM | POA: Diagnosis not present

## 2018-10-03 NOTE — Patient Instructions (Signed)

## 2018-10-31 ENCOUNTER — Telehealth: Payer: Self-pay | Admitting: Family Medicine

## 2018-10-31 NOTE — Telephone Encounter (Signed)
Amy at the pharmacy notified.

## 2018-10-31 NOTE — Telephone Encounter (Signed)
We have simvastatin 40 on his med list- why? What do they have?

## 2018-10-31 NOTE — Telephone Encounter (Signed)
Copied from [place] [phone]. Topic: Quick Communication - See Telephone Encounter >> Oct 31, 2018  9:04 AM Sheran Luz wrote: CRM for notification. See Telephone encounter for: 10/31/18.  Amy, with [facility], calling on behalf of patient to get clarification on which statin medication patient should be taking.

## 2018-11-15 DIAGNOSIS — R162 Hepatomegaly with splenomegaly, not elsewhere classified: Secondary | ICD-10-CM | POA: Diagnosis not present

## 2018-11-15 DIAGNOSIS — D471 Chronic myeloproliferative disease: Secondary | ICD-10-CM | POA: Diagnosis not present

## 2018-11-15 DIAGNOSIS — D630 Anemia in neoplastic disease: Secondary | ICD-10-CM | POA: Diagnosis not present

## 2019-01-03 ENCOUNTER — Other Ambulatory Visit: Payer: Medicare Other

## 2019-01-03 ENCOUNTER — Ambulatory Visit: Payer: Medicare Other | Admitting: Hematology and Oncology

## 2019-01-13 NOTE — Progress Notes (Signed)
[facility]  [address], [address] [place], [place] [address] Phone: [phone]  Fax: [phone]   Clinic Day:  01/14/2019  Referring physician: [name], DO  Chief Complaint: [name] is a 78 y.o. 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  HPI: The patient was last seen in the medical oncology clinic on 07/04/2018. At that time, he denied any B symptoms or early satiety. He had urinary frequency.  Exam revealed stable splenomegaly.  Hemoglobin was 10.0, platelets 226,000, WBC 2600 (ANC 1400).   He was seen via telemedicine in cardiology on 10/03/2018 by Dr. Rockey Situ. He was stable and continued on Plavix.   Labs on 09/05/2018: hematocrit 29.1, hemoglobin 9.7, MCV 92, platelets 231,000, WBC 2,600. Potassium 5.3, BUN 28, creatinine 1.19. TSH 3.710.   He was seen by Dr. Annabelle Harman on 11/15/2018.  He felt good.  He was working full time.  Exam was stable.  Hemoglobin was 9.6, WBC 2500 with a platelet count of 223,000.  Peripheral smear revealed scattered immature myeloid cells.  There were no circulating blasts.  There were rare nucleated red blood cells.  RBC morphology was abnormal with anisopoikilocytosis and teardrop poikilocytes c/w myelofibrosis. LDH was stable at 420.  He has a follow-up appointment on 05/16/2019  During the interim, he is doing "fine."  He denies any early satiety, fevers, or sweats. He denies any shortness of breath or chest pain. His bowel movements are normal. He denies any concerns.     Past Medical History:  Diagnosis Date  . Arthritis   . Benign hypertensive renal disease   . GERD (gastroesophageal reflux disease)   . Heartburn   . History of retinal detachment   . Hyperlipidemia   . Hypertension   . Melanoma ([place])    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 approximately 10-15 years ago  . Myelofibrosis ([place])   . Prostate hypertrophy   . Stroke [facility]) June 2009   R brain subcortical infarct     Past Surgical History:  Procedure Laterality Date  . ASPIRATION / INJECTION RENAL CYST  Feb 2017  . BACK SURGERY     approx 20- 25 years ago  . GAS INSERTION  08/11/2011   Procedure: INSERTION OF GAS;  Surgeon: Hayden Pedro, MD;  Location: [place];  Service: Ophthalmology;  Laterality: Right;  C3F8  . SCLERAL BUCKLE  08/11/2011   Procedure: SCLERAL BUCKLE;  Surgeon: Hayden Pedro, MD;  Location: [place];  Service: Ophthalmology;  Laterality: Right;  Marland Kitchen VARICOSE VEIN SURGERY      Family History  Problem Relation Age of Onset  . Heart disease Father   . Diabetes Son   . Kidney disease Neg Hx   . Prostate cancer Neg Hx     Social History:  reports that he quit smoking about 8 years ago. His smoking use included cigarettes. He smoked 0.25 packs per day. He has never used smokeless tobacco. He reports that he does not drink alcohol or use drugs. He works full-time as a Animator. He lives in [place].  He is alone today.  Allergies:  Allergies  Allergen Reactions  . Meloxicam Nausea And Vomiting    Current Medications: Current Outpatient Medications  Medication Sig Dispense Refill  . chlorpheniramine (CHLOR-TRIMETON) 4 MG tablet Take 4 mg by mouth 2 (two) times daily as needed for allergies.    Marland Kitchen clopidogrel (PLAVIX) 75 MG tablet Take 1 tablet (75 mg total) by  mouth daily. 90 tablet 3  . diclofenac sodium (VOLTAREN) 1 % GEL Apply 4 g topically 4 (four) times daily as needed (for neck pain). 100 g 6  . gabapentin (NEURONTIN) 300 MG capsule Take 1 capsule (300 mg total) by mouth 3 (three) times daily. 270 capsule 1  . Ginsengs-Saw Palmetto (MULTI GINSENG & SAW PALMETTO) 500 MG CAPS Take 500 mg by mouth daily.    Marland Kitchen losartan (COZAAR) 100 MG tablet Take 1 tablet (100 mg total) by mouth daily. 90 tablet 1  . meclizine (ANTIVERT) 25 MG tablet TAKE ONE TABLET 3 TIMES DAILY AS NEEDED FOR DIZZINESS 90 tablet 0  . Multiple Vitamin (MULITIVITAMIN WITH MINERALS) TABS Take 1 tablet by mouth  daily.    . Naproxen Sodium (ALEVE) 220 MG CAPS Take by mouth as needed.     . pantoprazole (PROTONIX) 20 MG tablet Take 20 mg by mouth daily.    . simvastatin (ZOCOR) 40 MG tablet TAKE ONE TABLET BY MOUTH EVERY DAY 90 tablet 1  . tamsulosin (FLOMAX) 0.4 MG CAPS capsule Take 1 capsule (0.4 mg total) by mouth daily. 90 capsule 3  . atorvastatin (LIPITOR) 10 MG tablet Take 10 mg by mouth daily at 6 PM.      No current facility-administered medications for this visit.     Review of Systems  Constitutional: Positive for weight loss (1 lb). Negative for chills, diaphoresis, fever and malaise/fatigue.       Doing "fine." Active.  HENT: Negative.  Negative for congestion, ear pain, hearing loss, nosebleeds, sinus pain and sore throat.   Eyes: Negative.  Negative for blurred vision.  Respiratory: Negative.  Negative for cough, sputum production, shortness of breath and wheezing.   Cardiovascular: Negative.  Negative for chest pain, palpitations, orthopnea, leg swelling and PND.  Gastrointestinal: Negative.  Negative for abdominal pain, blood in stool, constipation, diarrhea, melena, nausea and vomiting.  Genitourinary: Negative for dysuria, frequency, hematuria and urgency.       BPH  Musculoskeletal: Positive for joint pain (arthritis, left knee). Negative for back pain and myalgias.  Skin: Negative.  Negative for rash.  Neurological: Negative.  Negative for dizziness, tingling, sensory change, weakness and headaches.  Endo/Heme/Allergies: Negative.  Does not bruise/bleed easily.  Psychiatric/Behavioral: Negative.  Negative for depression, memory loss and substance abuse. The patient is not nervous/anxious and does not have insomnia.   All other systems reviewed and are negative.  Performance status (ECOG): 0  Vitals Blood pressure 137/72, pulse 61, temperature 97.8 F (36.6 C), temperature source Tympanic, resp. rate 16, weight 189 lb 9.5 oz (86 kg), SpO2 99 %.   Physical Exam   Constitutional: He is oriented to person, place, and time. He appears well-developed and well-nourished. No distress.  HENT:  Head: Normocephalic and atraumatic.  Mouth/Throat: Oropharynx is clear and moist. No oropharyngeal exudate.  Short gray hair. Wearing a mask.  Eyes: Pupils are equal, round, and reactive to light. Conjunctivae and EOM are normal. No scleral icterus.  Blue eyes.   Neck: Normal range of motion. Neck supple. No JVD present.  Cardiovascular: Normal rate, regular rhythm and normal heart sounds. Exam reveals no gallop and no friction rub.  No murmur heard. Pulmonary/Chest: Effort normal and breath sounds normal. No respiratory distress. He has no wheezes. He has no rales.  Abdominal: Soft. Bowel sounds are normal. He exhibits no distension. There is splenomegaly (2 FB below left costal margin, stable). There is no hepatomegaly. There is no abdominal tenderness. There is  no rebound and no guarding.  Musculoskeletal: Normal range of motion.        General: No edema.  Lymphadenopathy:    He has no cervical adenopathy.    He has no axillary adenopathy.       Right: No supraclavicular adenopathy present.       Left: No supraclavicular adenopathy present.  Neurological: He is alert and oriented to person, place, and time.  Skin: Skin is warm and dry. No rash noted. He is not diaphoretic. No erythema. No pallor.  Psychiatric: He has a normal mood and affect. His behavior is normal. Judgment and thought content normal.  Nursing note and vitals reviewed.    Appointment on 01/14/2019  Component Date Value Ref Range Status  . LDH 01/14/2019 396* 98 - 192 U/L Final   Performed at [facility], [address], [place], [place] [address]  . Sodium 01/14/2019 140  135 - 145 mmol/L Final  . Potassium 01/14/2019 4.5  3.5 - 5.1 mmol/L Final  . Chloride 01/14/2019 108  98 - 111 mmol/L Final  . CO2 01/14/2019 24  22 - 32 mmol/L Final  . Glucose, Bld 01/14/2019 109* 70 -  99 mg/dL Final  . BUN 01/14/2019 29* 8 - 23 mg/dL Final  . Creatinine, Ser 01/14/2019 1.25* 0.61 - 1.24 mg/dL Final  . Calcium 01/14/2019 8.8* 8.9 - 10.3 mg/dL Final  . Total Protein 01/14/2019 6.3* 6.5 - 8.1 g/dL Final  . Albumin 01/14/2019 3.9  3.5 - 5.0 g/dL Final  . AST 01/14/2019 27  15 - 41 U/L Final  . ALT 01/14/2019 24  0 - 44 U/L Final  . Alkaline Phosphatase 01/14/2019 95  38 - 126 U/L Final  . Total Bilirubin 01/14/2019 1.2  0.3 - 1.2 mg/dL Final  . GFR calc non Af Amer 01/14/2019 55* >60 mL/min Final  . GFR calc Af Amer 01/14/2019 >60  >60 mL/min Final  . Anion gap 01/14/2019 8  5 - 15 Final   Performed at [facility], [address], [place], [place] [address]  . WBC 01/14/2019 2.8* 4.0 - 10.5 K/uL Final  . RBC 01/14/2019 3.16* 4.22 - 5.81 MIL/uL Final  . Hemoglobin 01/14/2019 9.7* 13.0 - 17.0 g/dL Final  . HCT 01/14/2019 29.8* 39.0 - 52.0 % Final  . MCV 01/14/2019 94.3  80.0 - 100.0 fL Final  . MCH 01/14/2019 30.7  26.0 - 34.0 pg Final  . MCHC 01/14/2019 32.6  30.0 - 36.0 g/dL Final  . RDW 01/14/2019 16.4* 11.5 - 15.5 % Final  . Platelets 01/14/2019 200  150 - 400 K/uL Final  . nRBC 01/14/2019 1.8* 0.0 - 0.2 % Final  . Neutrophils Relative % 01/14/2019 48  % Final  . Neutro Abs 01/14/2019 1.4* 1.7 - 7.7 K/uL Final  . Band Neutrophils 01/14/2019 3  % Final  . Lymphocytes Relative 01/14/2019 21  % Final  . Lymphs Abs 01/14/2019 0.6* 0.7 - 4.0 K/uL Final  . Monocytes Relative 01/14/2019 14  % Final  . Monocytes Absolute 01/14/2019 0.4  0.1 - 1.0 K/uL Final  . Eosinophils Relative 01/14/2019 1  % Final  . Eosinophils Absolute 01/14/2019 0.0  0.0 - 0.5 K/uL Final  . Basophils Relative 01/14/2019 0  % Final  . Basophils Absolute 01/14/2019 0.0  0.0 - 0.1 K/uL Final  . Smear Review 01/14/2019 PLATELETS APPEAR ADEQUATE   Final  . Other 01/14/2019 4  % Final  . Metamyelocytes Relative 01/14/2019 3  %  Final  . Myelocytes 01/14/2019 6  % Final  . Abs Immature  Granulocytes 01/14/2019 0.30* 0.00 - 0.07 K/uL Final  . Tear Drop Cells 01/14/2019 PRESENT   Final  . Dimorphism 01/14/2019 PRESENT   Final  . Ovalocytes 01/14/2019 PRESENT   Final   Performed at [facility], [address] [place], [place] [address]    Assessment:  [name] is a 78 y.o. 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  He presented with a long history of normocytic anemia unresponsive to oral iron.  He has mild leukopenia.  He has mild splenomegaly (15.8 cm) by abdominal ultrasound on 06/04/2015.  DIPPS score is 56 (age 37- 1, hemoglobin less than 10- 2) and score of 4 if 1% circulating blasts included from 07/17/2014.  Bone marrow on 06/11/2014 was most consistent with primary myelofibrosis.  Bone marrow biopsy showed 1% abnormal cells: CD45+, CD5+, CD10, CD11c+/-, CD19+, CD2-+, (dim), CD22+ (dim, CD23+, CD38-/+, FMC7-, HLA-DR+, sig lambda+(dim).  Blasts were not increased 1.2%; hypercellular for age: 62%; JAK2 V617F mutation was negative.  CALR mutation positive.  Flow cytometry included about 1% CLL/SLL phenotype cells (VP0+) of uncertain significance and some infiltrate into the marrow with increased atypical megakaryocytes.  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  MDS FISH panel was negative.  Labs in 05/2014 included the following normal studies: direct [place], epo level, haptoglobin, ANA, hemoglobin electrophoresis, hepatitis B surface antigen, HIV testing, UPEP.  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  LDH was elevated (520). Hepatitis C by PCR was less than 2 IU/ml.   Symptomatically, he is doing well.  Exam is stable.  Plan: 1.   Labs today:  CBC with diff, CMP, LDH. 2.   Primary myelofibrosis             Clinically, he is doing well.    He is asymptomatic.    Counts are stable.    Continue surveillance. 3.   Continue fever and neutropenia precautions.   4.   Continue to alternate  follow-up appointments at [facility].    Patient is seen every 3 months.    Next appointment at [facility] is on 05/16/2019. 5.   RTC in 6 months for MD assessment and labs (CBC with diff, CMP, LDH).  I discussed the assessment and treatment plan with the patient.  The patient was provided an opportunity to ask questions and all were answered.  The patient agreed with the plan and demonstrated an understanding of the instructions.  The patient was advised to call back if the symptoms worsen or if the condition fails to improve as anticipated.   Lequita Asal, MD, PhD    01/14/2019, 3:07 PM  I, Valentino Nose acting as Education administrator for [employer]. Mike Gip, MD, PhD.  I,  C. Mike Gip, MD, have reviewed the above documentation for accuracy and completeness, and I agree with the above.

## 2019-01-14 ENCOUNTER — Inpatient Hospital Stay (HOSPITAL_BASED_OUTPATIENT_CLINIC_OR_DEPARTMENT_OTHER): Payer: Medicare Other | Admitting: Hematology and Oncology

## 2019-01-14 ENCOUNTER — Encounter: Payer: Self-pay | Admitting: Hematology and Oncology

## 2019-01-14 ENCOUNTER — Inpatient Hospital Stay: Payer: Medicare Other | Attending: Hematology and Oncology

## 2019-01-14 ENCOUNTER — Other Ambulatory Visit: Payer: Self-pay

## 2019-01-14 ENCOUNTER — Other Ambulatory Visit: Payer: Self-pay | Admitting: Family Medicine

## 2019-01-14 VITALS — BP 137/72 | HR 61 | Temp 97.8°F | Resp 16 | Wt 189.6 lb

## 2019-01-14 DIAGNOSIS — Z7902 Long term (current) use of antithrombotics/antiplatelets: Secondary | ICD-10-CM | POA: Diagnosis not present

## 2019-01-14 DIAGNOSIS — Z791 Long term (current) use of non-steroidal anti-inflammatories (NSAID): Secondary | ICD-10-CM | POA: Insufficient documentation

## 2019-01-14 DIAGNOSIS — Z85820 Personal history of malignant melanoma of skin: Secondary | ICD-10-CM | POA: Diagnosis not present

## 2019-01-14 DIAGNOSIS — D7581 Myelofibrosis: Secondary | ICD-10-CM | POA: Diagnosis not present

## 2019-01-14 DIAGNOSIS — I639 Cerebral infarction, unspecified: Secondary | ICD-10-CM

## 2019-01-14 DIAGNOSIS — Z87891 Personal history of nicotine dependence: Secondary | ICD-10-CM | POA: Insufficient documentation

## 2019-01-14 DIAGNOSIS — Z8673 Personal history of transient ischemic attack (TIA), and cerebral infarction without residual deficits: Secondary | ICD-10-CM | POA: Diagnosis not present

## 2019-01-14 DIAGNOSIS — E785 Hyperlipidemia, unspecified: Secondary | ICD-10-CM | POA: Diagnosis not present

## 2019-01-14 DIAGNOSIS — I10 Essential (primary) hypertension: Secondary | ICD-10-CM | POA: Diagnosis not present

## 2019-01-14 DIAGNOSIS — R161 Splenomegaly, not elsewhere classified: Secondary | ICD-10-CM | POA: Insufficient documentation

## 2019-01-14 DIAGNOSIS — R162 Hepatomegaly with splenomegaly, not elsewhere classified: Secondary | ICD-10-CM

## 2019-01-14 DIAGNOSIS — Z79899 Other long term (current) drug therapy: Secondary | ICD-10-CM | POA: Insufficient documentation

## 2019-01-14 LAB — CBC WITH DIFFERENTIAL/PLATELET
Abs Immature Granulocytes: 0.3 10*3/uL — ABNORMAL HIGH (ref 0.00–0.07)
Band Neutrophils: 3 %
Basophils Absolute: 0 10*3/uL (ref 0.0–0.1)
Basophils Relative: 0 %
Eosinophils Absolute: 0 10*3/uL (ref 0.0–0.5)
Eosinophils Relative: 1 %
HCT: 29.8 % — ABNORMAL LOW (ref 39.0–52.0)
Hemoglobin: 9.7 g/dL — ABNORMAL LOW (ref 13.0–17.0)
Lymphocytes Relative: 21 %
Lymphs Abs: 0.6 10*3/uL — ABNORMAL LOW (ref 0.7–4.0)
MCH: 30.7 pg (ref 26.0–34.0)
MCHC: 32.6 g/dL (ref 30.0–36.0)
MCV: 94.3 fL (ref 80.0–100.0)
Metamyelocytes Relative: 3 %
Monocytes Absolute: 0.4 10*3/uL (ref 0.1–1.0)
Monocytes Relative: 14 %
Myelocytes: 6 %
Neutro Abs: 1.4 10*3/uL — ABNORMAL LOW (ref 1.7–7.7)
Neutrophils Relative %: 48 %
Other: 4 %
Platelets: 200 10*3/uL (ref 150–400)
RBC: 3.16 MIL/uL — ABNORMAL LOW (ref 4.22–5.81)
RDW: 16.4 % — ABNORMAL HIGH (ref 11.5–15.5)
Smear Review: ADEQUATE
WBC: 2.8 10*3/uL — ABNORMAL LOW (ref 4.0–10.5)
nRBC: 1.8 % — ABNORMAL HIGH (ref 0.0–0.2)

## 2019-01-14 LAB — COMPREHENSIVE METABOLIC PANEL
ALT: 24 U/L (ref 0–44)
AST: 27 U/L (ref 15–41)
Albumin: 3.9 g/dL (ref 3.5–5.0)
Alkaline Phosphatase: 95 U/L (ref 38–126)
Anion gap: 8 (ref 5–15)
BUN: 29 mg/dL — ABNORMAL HIGH (ref 8–23)
CO2: 24 mmol/L (ref 22–32)
Calcium: 8.8 mg/dL — ABNORMAL LOW (ref 8.9–10.3)
Chloride: 108 mmol/L (ref 98–111)
Creatinine, Ser: 1.25 mg/dL — ABNORMAL HIGH (ref 0.61–1.24)
GFR calc Af Amer: >60 mL/min (ref >60)
GFR calc non Af Amer: 55 mL/min — ABNORMAL LOW (ref >60)
Glucose, Bld: 109 mg/dL — ABNORMAL HIGH (ref 70–99)
Potassium: 4.5 mmol/L (ref 3.5–5.1)
Sodium: 140 mmol/L (ref 135–145)
Total Bilirubin: 1.2 mg/dL (ref 0.3–1.2)
Total Protein: 6.3 g/dL — ABNORMAL LOW (ref 6.5–8.1)

## 2019-01-14 LAB — LACTATE DEHYDROGENASE: LDH: 396 U/L — ABNORMAL HIGH (ref 98–192)

## 2019-01-14 NOTE — Progress Notes (Signed)
Pt here for follow up. Denies any concerns at this time.  

## 2019-01-14 NOTE — Telephone Encounter (Signed)
Forwarding medication refill request to PCP for review. 

## 2019-01-15 LAB — PATHOLOGIST SMEAR REVIEW

## 2019-01-30 ENCOUNTER — Ambulatory Visit (INDEPENDENT_AMBULATORY_CARE_PROVIDER_SITE_OTHER): Payer: Medicare Other

## 2019-01-30 VITALS — Ht 67.0 in | Wt 189.0 lb

## 2019-01-30 DIAGNOSIS — Z0000 Encounter for general adult medical examination without abnormal findings: Secondary | ICD-10-CM | POA: Diagnosis not present

## 2019-01-30 NOTE — Progress Notes (Signed)
Subjective:   [name] is a 78 y.o. male who presents for Medicare Annual/Subsequent preventive examination.  This visit is being conducted via phone call  - after an attmept to do on video chat - due to the COVID-19 pandemic. This patient has given me verbal consent via phone to conduct this visit, patient states they are participating from their home address. Some vital signs may be absent or patient reported.   Patient identification: identified by name, DOB, and current address.    Review of Systems:   Cardiac Risk Factors include: advanced age (>44men, >74 women);hypertension;dyslipidemia;male gender     Objective:    Vitals: There were no vitals taken for this visit.  There is no height or weight on file to calculate BMI.  Advanced Directives 01/30/2019 01/14/2019 07/04/2018 01/24/2018 01/03/2018 07/05/2017 01/18/2017  Does Patient Have a Medical Advance Directive? No No No No No No No  Would patient like information on creating a medical advance directive? - No - Patient declined No - Patient declined Yes (MAU/Ambulatory/Procedural Areas - Information given) - - Yes (MAU/Ambulatory/Procedural Areas - Information given)  Pre-existing out of facility DNR order (yellow form or pink MOST form) - - - - - - -    Tobacco Social History   Tobacco Use  Smoking Status Former Smoker   Packs/day: 0.25   Types: Cigarettes   Quit date: 06/06/2010   Years since quitting: 8.6  Smokeless Tobacco Never Used     Counseling given: Not Answered   Clinical Intake:  Pre-visit preparation completed: Yes  Pain : No/denies pain     Nutritional Risks: None Diabetes: No  How often do you need to have someone help you when you read instructions, pamphlets, or other written materials from your doctor or pharmacy?: 1 - Never        Past Medical History:  Diagnosis Date   Arthritis    Benign hypertensive renal disease    GERD (gastroesophageal reflux disease)    Heartburn      History of retinal detachment    Hyperlipidemia    Hypertension    Melanoma ([place])    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 approximately 10-15 years ago   Myelofibrosis ([place])    Prostate hypertrophy    Stroke [facility]) June 2009   R brain subcortical infarct   Past Surgical History:  Procedure Laterality Date   ASPIRATION / INJECTION RENAL CYST  Feb 2017   BACK SURGERY     approx 20- 25 years ago   GAS INSERTION  08/11/2011   Procedure: INSERTION OF GAS;  Surgeon: Hayden Pedro, MD;  Location: [place];  Service: Ophthalmology;  Laterality: Right;  [id number]   SCLERAL BUCKLE  08/11/2011   Procedure: SCLERAL BUCKLE;  Surgeon: Hayden Pedro, MD;  Location: [place];  Service: Ophthalmology;  Laterality: Right;   VARICOSE VEIN SURGERY     Family History  Problem Relation Age of Onset   Heart disease Father    Diabetes Son    Kidney disease Neg Hx    Prostate cancer Neg Hx    Social History   Socioeconomic History   Marital status: Married    Spouse name: Parke Simmers    Number of children: 2   Years of education: 12+   Highest education level: Some college, no degree  Occupational History   Occupation: Education officer, community: OTHER    Comment: community   Occupation: SELF EMPLOYED    Employer:  SELF EMPLOYED  Social Designer, fashion/clothing strain: Not hard at all   Food insecurity    Worry: Never true    Inability: Never true   Transportation needs    Medical: No    Non-medical: No  Tobacco Use   Smoking status: Former Smoker    Packs/day: 0.25    Types: Cigarettes    Quit date: 06/06/2010    Years since quitting: 8.6   Smokeless tobacco: Never Used  Substance and Sexual Activity   Alcohol use: No    Alcohol/week: 0.0 standard drinks   Drug use: No   Sexual activity: Not on file  Lifestyle   Physical activity    Days per week: 0 days    Minutes per session: 0 min   Stress: Not at all  Relationships   Social connections    Talks on phone:  More than three times a week    Gets together: More than three times a week    Attends religious service: More than 4 times per year    Active member of club or organization: Yes    Attends meetings of clubs or organizations: More than 4 times per year    Relationship status: Married  Other Topics Concern   Not on file  Social History Narrative   Pt lives at home with his family.   Caffeine Use- 2 cups daily   Patient has 2 children.    Patient has some college.    Patient is right handed.          Works full time    Outpatient Encounter Medications as of 01/30/2019  Medication Sig   clopidogrel (PLAVIX) 75 MG tablet Take 1 tablet (75 mg total) by mouth daily.   diclofenac sodium (VOLTAREN) 1 % GEL Apply 4 g topically 4 (four) times daily as needed (for neck pain).   gabapentin (NEURONTIN) 300 MG capsule Take 1 capsule (300 mg total) by mouth 3 (three) times daily.   Ginsengs-Saw Palmetto (MULTI GINSENG & SAW PALMETTO) 500 MG CAPS Take 500 mg by mouth daily.   losartan (COZAAR) 100 MG tablet Take 1 tablet (100 mg total) by mouth daily.   Multiple Vitamin (MULITIVITAMIN WITH MINERALS) TABS Take 1 tablet by mouth daily.   Naproxen Sodium (ALEVE) 220 MG CAPS Take by mouth as needed.    omeprazole (PRILOSEC) 20 MG capsule Take 20 mg by mouth daily.   simvastatin (ZOCOR) 40 MG tablet TAKE ONE TABLET BY MOUTH EVERY DAY   tamsulosin (FLOMAX) 0.4 MG CAPS capsule Take 1 capsule (0.4 mg total) by mouth daily.   meclizine (ANTIVERT) 25 MG tablet TAKE ONE TABLET 3 TIMES DAILY AS NEEDED FOR DIZZINESS (Patient not taking: Reported on 01/30/2019)   [DISCONTINUED] atorvastatin (LIPITOR) 10 MG tablet Take 10 mg by mouth daily at 6 PM.    [DISCONTINUED] chlorpheniramine (CHLOR-TRIMETON) 4 MG tablet Take 4 mg by mouth 2 (two) times daily as needed for allergies.   [DISCONTINUED] pantoprazole (PROTONIX) 20 MG tablet Take 20 mg by mouth daily.   No facility-administered encounter  medications on file as of 01/30/2019.     Activities of Daily Living In your present state of health, do you have any difficulty performing the following activities: 01/30/2019 09/03/2018  Hearing? Y Y  Comment hearing aids -  Vision? N N  Comment reading glassses -  Difficulty concentrating or making decisions? N N  Walking or climbing stairs? N N  Comment uses brace for knee -  Dressing or bathing? N N  Doing errands, shopping? N N  Preparing Food and eating ? N -  Using the Toilet? N -  In the past six months, have you accidently leaked urine? N -  Do you have problems with loss of bowel control? N -  Managing your Medications? N -  Managing your Finances? N -  Housekeeping or managing your Housekeeping? N -  Some recent data might be hidden    Patient Care Team: [name], DO as PCP - General (Family Medicine) Pieter Partridge, MD as Referring Physician (Hematology) Lequita Asal, MD as Referring Physician (Hematology and Oncology) Hayden Pedro, MD as Consulting Physician (Ophthalmology) Garvin Fila, MD as Consulting Physician (Neurology) Festus Aloe, MD as Consulting Physician (Urology) Minna Merritts, MD as Consulting Physician (Cardiology) Abbie Sons, MD (Urology)   Assessment:   This is a routine wellness examination for Andrews.  Exercise Activities and Dietary recommendations Current Exercise Habits: The patient has a physically strenuous job, but has no regular exercise apart from work., Type of exercise: walking, Time (Minutes): 60, Frequency (Times/Week): 7, Weekly Exercise (Minutes/Week): 420, Intensity: Mild, Exercise limited by: None identified  Goals     DIET - INCREASE WATER INTAKE     Recommend drinking at least 6-8 glasses of water a day        Fall Risk: Fall Risk  01/30/2019 09/03/2018 01/24/2018 01/18/2017 04/14/2016  Falls in the past year? 0 0 No Yes No  Number falls in past yr: - 0 - 1 -  Injury with Fall? - 0  - Yes -  Follow up - Falls evaluation completed - Falls prevention discussed -    FALL RISK PREVENTION PERTAINING TO THE HOME:  Any stairs in or around the home? Yes  If so, are there any without handrails? No   Home free of loose throw rugs in walkways, pet beds, electrical cords, etc? Yes  Adequate lighting in your home to reduce risk of falls? Yes   ASSISTIVE DEVICES UTILIZED TO PREVENT FALLS:  Life alert? No  Use of a cane, walker or w/c? No  Grab bars in the bathroom? Yes  Shower chair or bench in shower? Yes  Elevated toilet seat or a handicapped toilet? Yes   TIMED UP AND GO:    Depression Screen PHQ 2/9 Scores 01/30/2019 01/24/2018 01/18/2017 04/14/2016  PHQ - 2 Score 0 0 0 0    Cognitive Function     6CIT Screen 01/24/2018 01/18/2017  What Year? 0 points 0 points  What month? 0 points 0 points  What time? 0 points 0 points  Count back from 20 0 points 0 points  Months in reverse 0 points 0 points  Repeat phrase 0 points 2 points  Total Score 0 2    Immunization History  Administered Date(s) Administered   Influenza, High Dose Seasonal PF 02/09/2017, 03/13/2018   Influenza,inj,Quad PF,6+ Mos 03/03/2015   Influenza-Unspecified 03/07/2016   Pneumococcal Conjugate-13 07/08/2015   Pneumococcal Polysaccharide-23 08/12/2011   Td 10/11/2016   Zoster 06/07/2011    Qualifies for Shingles Vaccine? Yes  Zostavax completed n/a. Due for Shingrix. Education has been provided regarding the importance of this vaccine. Pt has been advised to call insurance company to determine out of pocket expense. Advised may also receive vaccine at local pharmacy or Health Dept. Verbalized acceptance and understanding.   Tdap: up to date   Flu Vaccine: Due now  Pneumococcal Vaccine: up to date  Screening Tests Health Maintenance  Topic Date Due   INFLUENZA VACCINE  01/05/2019   TETANUS/TDAP  10/12/2026   PNA vac Low Risk Adult  Completed   Cancer  Screenings:  Colorectal Screening: no longer required  Lung Cancer Screening: (Low Dose CT Chest recommended if Age 46-80 years, 30 pack-year currently smoking OR have quit w/in 15years.) does not qualify.    Additional Screening:  Hepatitis C Screening: does not qualify  Vision Screening: Recommended annual ophthalmology exams for early detection of glaucoma and other disorders of the eye. Is the patient up to date with their annual eye exam?  Yes  Who is the provider or what is the name of the office in which the pt attends annual eye exams? [facility]   Dental Screening: Recommended annual dental exams for proper oral hygiene  Community Resource Referral:  CRR required this visit?  No        Plan:  I have personally reviewed and addressed the Medicare Annual Wellness questionnaire and have noted the following in the patients chart:  A. Medical and social history B. Use of alcohol, tobacco or illicit drugs  C. Current medications and supplements D. Functional ability and status E.  Nutritional status F.  Physical activity G. Advance directives H. List of other physicians I.  Hospitalizations, surgeries, and ER visits in previous 12 months J.  [place] such as hearing and vision if needed, cognitive and depression L. Referrals and appointments   In addition, I have reviewed and discussed with patient certain preventive protocols, quality metrics, and best practice recommendations. A written personalized care plan for preventive services as well as general preventive health recommendations were provided to patient.   Signed,   Bevelyn Ngo, LPN  [id number] Nurse Health Advisor   Nurse Notes: was taking protonix and says had to stop it for some reason - either because he was on Plavix or insurance wouldn't cover, and is taking omeprazole now and states it does not help. Is requesting something else to take that insurance will cover.

## 2019-03-12 ENCOUNTER — Other Ambulatory Visit: Payer: Self-pay

## 2019-03-12 ENCOUNTER — Ambulatory Visit (INDEPENDENT_AMBULATORY_CARE_PROVIDER_SITE_OTHER): Payer: Medicare Other | Admitting: Family Medicine

## 2019-03-12 ENCOUNTER — Encounter: Payer: Self-pay | Admitting: Family Medicine

## 2019-03-12 VITALS — Ht 71.0 in

## 2019-03-12 DIAGNOSIS — I639 Cerebral infarction, unspecified: Secondary | ICD-10-CM

## 2019-03-12 DIAGNOSIS — K219 Gastro-esophageal reflux disease without esophagitis: Secondary | ICD-10-CM | POA: Diagnosis not present

## 2019-03-12 DIAGNOSIS — I129 Hypertensive chronic kidney disease with stage 1 through stage 4 chronic kidney disease, or unspecified chronic kidney disease: Secondary | ICD-10-CM | POA: Diagnosis not present

## 2019-03-12 DIAGNOSIS — E782 Mixed hyperlipidemia: Secondary | ICD-10-CM

## 2019-03-12 DIAGNOSIS — H6982 Other specified disorders of Eustachian tube, left ear: Secondary | ICD-10-CM

## 2019-03-12 MED ORDER — OMEPRAZOLE 40 MG PO CPDR: 40.0000 mg | DELAYED_RELEASE_CAPSULE | Freq: Every day | ORAL | 3 refills | Status: DC

## 2019-03-12 MED ORDER — PREDNISONE 50 MG PO TABS: 50.0000 mg | ORAL_TABLET | Freq: Every day | ORAL | 0 refills | Status: DC

## 2019-03-12 MED ORDER — GABAPENTIN 300 MG PO CAPS: 300.0000 mg | ORAL_CAPSULE | Freq: Three times a day (TID) | ORAL | 1 refills | Status: DC

## 2019-03-12 MED ORDER — TAMSULOSIN HCL 0.4 MG PO CAPS: 0.4000 mg | ORAL_CAPSULE | Freq: Every day | ORAL | 3 refills | Status: DC

## 2019-03-12 MED ORDER — SIMVASTATIN 40 MG PO TABS: ORAL_TABLET | ORAL | 1 refills | Status: DC

## 2019-03-12 MED ORDER — LOSARTAN POTASSIUM 100 MG PO TABS: 100.0000 mg | ORAL_TABLET | Freq: Every day | ORAL | 1 refills | Status: DC

## 2019-03-12 NOTE — Progress Notes (Signed)
Ht 5\' 11"  (1.803 m)   BMI 26.36 kg/m    Subjective:    Patient ID: [name], male    DOB: 01-Feb-1941, 79 y.o.   MRN: [id number]  HPI: [name] is a 78 y.o. male  Chief Complaint  Patient presents with  . Hypertension  . Hyperlipidemia  . Ear Problem    left ear stopped up since this morning   HYPERTENSION / HYPERLIPIDEMIA Satisfied with current treatment? yes Duration of hypertension: chronic BP monitoring frequency: not checking BP medication side effects: no Past BP meds: losartan Duration of hyperlipidemia: chronic Cholesterol medication side effects: no Cholesterol supplements: none Past cholesterol medications: simvastatin Medication compliance: excellent compliance Aspirin: no Recent stressors: no Recurrent headaches: no Visual changes: no Palpitations: no Dyspnea: no Chest pain: no Lower extremity edema: no Dizzy/lightheaded: no  EAG CLOGGED Duration: last week Involved ear(s):  left Sensation of feeling clogged/plugged: yes Decreased/muffled hearing:yes Ear pain: no Fever: no Otorrhea: no Hearing loss: yes Upper respiratory infection symptoms: no Using Q-Tips: no Status: worse History of cerumenosis: no Treatments attempted: none  Relevant past medical, surgical, family and social history reviewed and updated as indicated. Interim medical history since our last visit reviewed. Allergies and medications reviewed and updated.  Review of Systems  Constitutional: Negative.   HENT: Positive for congestion and hearing loss. Negative for dental problem, drooling, ear discharge, ear pain, facial swelling, mouth sores, nosebleeds, postnasal drip, rhinorrhea, sinus pressure, sinus pain, sneezing, sore throat, tinnitus, trouble swallowing and voice change.   Eyes: Negative.   Respiratory: Negative.   Cardiovascular: Negative.   Gastrointestinal: Positive for abdominal pain and nausea. Negative for abdominal distention, anal bleeding, blood in  stool, constipation, diarrhea, rectal pain and vomiting.  Musculoskeletal: Negative.   Neurological: Negative.   Psychiatric/Behavioral: Negative.     Per HPI unless specifically indicated above     Objective:    Ht 5\' 11"  (1.803 m)   BMI 26.36 kg/m   Wt Readings from Last 3 Encounters:  01/30/19 189 lb (85.7 kg)  01/14/19 189 lb 9.5 oz (86 kg)  07/04/18 190 lb 7.6 oz (86.4 kg)    Physical Exam Vitals signs and nursing note reviewed.  Pulmonary:     Effort: Pulmonary effort is normal. No respiratory distress.     Comments: Speaking in full sentences Neurological:     Mental Status: He is alert.  Psychiatric:        Mood and Affect: Mood normal.        Behavior: Behavior normal.        Thought Content: Thought content normal.        Judgment: Judgment normal.     Results for orders placed or performed in visit on 01/14/19  Lactate dehydrogenase  Result Value Ref Range   LDH 396 (H) 98 - 192 U/L  Comprehensive metabolic panel  Result Value Ref Range   Sodium 140 135 - 145 mmol/L   Potassium 4.5 3.5 - 5.1 mmol/L   Chloride 108 98 - 111 mmol/L   CO2 24 22 - 32 mmol/L   Glucose, Bld 109 (H) 70 - 99 mg/dL   BUN 29 (H) 8 - 23 mg/dL   Creatinine, Ser 1.25 (H) 0.61 - 1.24 mg/dL   Calcium 8.8 (L) 8.9 - 10.3 mg/dL   Total Protein 6.3 (L) 6.5 - 8.1 g/dL   Albumin 3.9 3.5 - 5.0 g/dL   AST 27 15 - 41 U/L   ALT 24 0 -  44 U/L   Alkaline Phosphatase 95 38 - 126 U/L   Total Bilirubin 1.2 0.3 - 1.2 mg/dL   GFR calc non Af Amer 55 (L) >60 mL/min   GFR calc Af Amer >60 >60 mL/min   Anion gap 8 5 - 15  CBC with Differential/Platelet  Result Value Ref Range   WBC 2.8 (L) 4.0 - 10.5 K/uL   RBC 3.16 (L) 4.22 - 5.81 MIL/uL   Hemoglobin 9.7 (L) 13.0 - 17.0 g/dL   HCT 29.8 (L) 39.0 - 52.0 %   MCV 94.3 80.0 - 100.0 fL   MCH 30.7 26.0 - 34.0 pg   MCHC 32.6 30.0 - 36.0 g/dL   RDW 16.4 (H) 11.5 - 15.5 %   Platelets 200 150 - 400 K/uL   nRBC 1.8 (H) 0.0 - 0.2 %   Neutrophils  Relative % 48 %   Neutro Abs 1.4 (L) 1.7 - 7.7 K/uL   Band Neutrophils 3 %   Lymphocytes Relative 21 %   Lymphs Abs 0.6 (L) 0.7 - 4.0 K/uL   Monocytes Relative 14 %   Monocytes Absolute 0.4 0.1 - 1.0 K/uL   Eosinophils Relative 1 %   Eosinophils Absolute 0.0 0.0 - 0.5 K/uL   Basophils Relative 0 %   Basophils Absolute 0.0 0.0 - 0.1 K/uL   Smear Review PLATELETS APPEAR ADEQUATE    Other 4 %   Metamyelocytes Relative 3 %   Myelocytes 6 %   Abs Immature Granulocytes 0.30 (H) 0.00 - 0.07 K/uL   Tear Drop Cells PRESENT    Dimorphism PRESENT    Ovalocytes PRESENT   Pathologist smear review  Result Value Ref Range   Path Review Peripheral bleed smear is reviewed.       Assessment & Plan:   Problem List Items Addressed This Visit      Digestive   Gastroesophageal reflux disease    Will start him on omeprazole and see how he does- if not getting better, let us know.      Relevant Medications   omeprazole (PRILOSEC) 40 MG capsule     Genitourinary   Benign hypertensive renal disease    Has been doing well with his medicine per his report- will have him come in for BP check and labs. Under good control on current regimen. Continue current regimen. Continue to monitor. Call with any concerns. Refills given.        Relevant Orders   Comprehensive metabolic panel     Other   Mixed hyperlipidemia    Under good control on current regimen. Continue current regimen. Continue to monitor. Call with any concerns. Refills given. Labs to be drawn ASAP.       Relevant Medications   simvastatin (ZOCOR) 40 MG tablet   losartan (COZAAR) 100 MG tablet   Other Relevant Orders   Comprehensive metabolic panel   Lipid Panel w/o Chol/HDL Ratio    Other Visit Diagnoses    Dysfunction of left eustachian tube    -  Primary   Will treat with burst of prednisone, if not better by Friday- call and we will see him in person.        Follow up plan: Return in about 6 months (around 09/10/2019)  for Wellness/follow up.   . This visit was completed via telephone due to the restrictions of the COVID-19 pandemic. All issues as above were discussed and addressed but no physical exam was performed. If it was felt that the patient  should be evaluated in the office, they were directed there. The patient verbally consented to this visit. Patient was unable to complete an audio/visual visit due to Lack of equipment. Due to the catastrophic nature of the COVID-19 pandemic, this visit was done through audio contact only. . Location of the patient: home . Location of the provider: work . Those involved with this call:  . Provider: Park Liter, DO . CMA: Lesle Chris, [place] . Front Desk/Registration: Don Perking  . Time spent on call: 25 minutes on the phone discussing health concerns. 40 minutes total spent in review of patient's record and preparation of their chart.

## 2019-03-13 ENCOUNTER — Encounter: Payer: Self-pay | Admitting: Family Medicine

## 2019-03-13 DIAGNOSIS — K219 Gastro-esophageal reflux disease without esophagitis: Secondary | ICD-10-CM | POA: Insufficient documentation

## 2019-03-13 NOTE — Assessment & Plan Note (Signed)
Will start him on omeprazole and see how he does- if not getting better, let us know.

## 2019-03-13 NOTE — Assessment & Plan Note (Signed)
Has been doing well with his medicine per his report- will have him come in for BP check and labs. Under good control on current regimen. Continue current regimen. Continue to monitor. Call with any concerns. Refills given.

## 2019-03-13 NOTE — Assessment & Plan Note (Signed)
Under good control on current regimen. Continue current regimen. Continue to monitor. Call with any concerns. Refills given. Labs to be drawn ASAP.   

## 2019-03-18 ENCOUNTER — Ambulatory Visit (INDEPENDENT_AMBULATORY_CARE_PROVIDER_SITE_OTHER): Payer: Medicare Other | Admitting: Family Medicine

## 2019-03-18 ENCOUNTER — Encounter: Payer: Self-pay | Admitting: Family Medicine

## 2019-03-18 ENCOUNTER — Other Ambulatory Visit: Payer: Self-pay

## 2019-03-18 VITALS — BP 171/86 | HR 59 | Temp 98.5°F

## 2019-03-18 DIAGNOSIS — I639 Cerebral infarction, unspecified: Secondary | ICD-10-CM

## 2019-03-18 DIAGNOSIS — H6122 Impacted cerumen, left ear: Secondary | ICD-10-CM

## 2019-03-18 DIAGNOSIS — E782 Mixed hyperlipidemia: Secondary | ICD-10-CM | POA: Diagnosis not present

## 2019-03-18 DIAGNOSIS — I129 Hypertensive chronic kidney disease with stage 1 through stage 4 chronic kidney disease, or unspecified chronic kidney disease: Secondary | ICD-10-CM | POA: Diagnosis not present

## 2019-03-18 MED ORDER — FLUTICASONE PROPIONATE 50 MCG/ACT NA SUSP: 1.0000 | Freq: Two times a day (BID) | NASAL | 0 refills | Status: DC

## 2019-03-18 NOTE — Progress Notes (Signed)
BP (!) 171/86   Pulse (!) 59   Temp 98.5 F (36.9 C)   SpO2 98%    Subjective:    Patient ID: [name], male    DOB: 1941-01-15, 78 y.o.   MRN: [id number]  HPI: [name] is a 78 y.o. male  Chief Complaint  Patient presents with  . Hearing Problem    left ear, finished 3 days of prednisone   Started having left ear pain and pressure, muffled and echoed hearing a week ago. Had virtual visit and was given prednisone for possible eustachian tube dysfunction which he states has not helped. Denies pain, fevers, drainage, sore throat, hx of ear issues. Otherwise not trying anything for sxs.   Relevant past medical, surgical, family and social history reviewed and updated as indicated. Interim medical history since our last visit reviewed. Allergies and medications reviewed and updated.  Review of Systems  Per HPI unless specifically indicated above     Objective:    BP (!) 171/86   Pulse (!) 59   Temp 98.5 F (36.9 C)   SpO2 98%   Wt Readings from Last 3 Encounters:  01/30/19 189 lb (85.7 kg)  01/14/19 189 lb 9.5 oz (86 kg)  07/04/18 190 lb 7.6 oz (86.4 kg)    Physical Exam Vitals signs and nursing note reviewed.  Constitutional:      Appearance: Normal appearance.  HENT:     Head: Atraumatic.     Ears:     Comments: Left EAC impacted with cerumen Right EAC clear and benign Unable to visualize left TM due to wax buildup, but right TM WNL.     Nose: Nose normal.     Mouth/Throat:     Mouth: Mucous membranes are moist.     Pharynx: Oropharynx is clear.  Eyes:     Extraocular Movements: Extraocular movements intact.     Conjunctiva/sclera: Conjunctivae normal.  Neck:     Musculoskeletal: Normal range of motion and neck supple.  Cardiovascular:     Rate and Rhythm: Normal rate and regular rhythm.  Pulmonary:     Effort: Pulmonary effort is normal.     Breath sounds: Normal breath sounds.  Musculoskeletal: Normal range of motion.  Skin:    General:  Skin is warm and dry.  Neurological:     General: No focal deficit present.     Mental Status: He is oriented to person, place, and time.  Psychiatric:        Mood and Affect: Mood normal.        Thought Content: Thought content normal.        Judgment: Judgment normal.    Procedure: lavage left ear d/t cerumen impaction Procedure explained at length with patient, who has opportunity to ask all questions which were answered. Warm water and lavage device used to flush out left ear without issue. Wax was removed, TM was visualized and intact/benign. Patient tolerated procedure very well with no immediate complications. Home care was reviewed.    Results for orders placed or performed in visit on 03/12/19  Comprehensive metabolic panel  Result Value Ref Range   Glucose 101 (H) 65 - 99 mg/dL   BUN 19 8 - 27 mg/dL   Creatinine, Ser 0.98 0.76 - 1.27 mg/dL   GFR calc non Af Amer 74 >59 mL/min/1.73   GFR calc Af Amer 85 >59 mL/min/1.73   BUN/Creatinine Ratio 19 10 - 24   Sodium 142 134 - 144  mmol/L   Potassium 4.4 3.5 - 5.2 mmol/L   Chloride 109 (H) 96 - 106 mmol/L   CO2 21 20 - 29 mmol/L   Calcium 8.3 (L) 8.6 - 10.2 mg/dL   Total Protein 5.4 (L) 6.0 - 8.5 g/dL   Albumin 3.8 3.7 - 4.7 g/dL   Globulin, Total 1.6 1.5 - 4.5 g/dL   Albumin/Globulin Ratio 2.4 (H) 1.2 - 2.2   Bilirubin Total 0.9 0.0 - 1.2 mg/dL   Alkaline Phosphatase 101 39 - 117 IU/L   AST 22 0 - 40 IU/L   ALT 29 0 - 44 IU/L  Lipid Panel w/o Chol/HDL Ratio  Result Value Ref Range   Cholesterol, Total 103 100 - 199 mg/dL   Triglycerides 91 0 - 149 mg/dL   HDL 32 (L) >39 mg/dL   VLDL Cholesterol Cal 18 5 - 40 mg/dL   LDL Chol Calc (NIH) 53 0 - 99 mg/dL      Assessment & Plan:   Problem List Items Addressed This Visit    None    Visit Diagnoses    Impacted cerumen of left ear    -  Primary   Lavage performed today with success clearing wax. Debrox drops prn for home care. F/u if sxs not fully resolved        Follow up plan: Return if symptoms worsen or fail to improve.

## 2019-03-19 ENCOUNTER — Encounter: Payer: Self-pay | Admitting: Family Medicine

## 2019-03-19 LAB — COMPREHENSIVE METABOLIC PANEL
ALT: 29 IU/L (ref 0–44)
AST: 22 IU/L (ref 0–40)
Albumin/Globulin Ratio: 2.4 — ABNORMAL HIGH (ref 1.2–2.2)
Albumin: 3.8 g/dL (ref 3.7–4.7)
Alkaline Phosphatase: 101 IU/L (ref 39–117)
BUN/Creatinine Ratio: 19 (ref 10–24)
BUN: 19 mg/dL (ref 8–27)
Bilirubin Total: 0.9 mg/dL (ref 0.0–1.2)
CO2: 21 mmol/L (ref 20–29)
Calcium: 8.3 mg/dL — ABNORMAL LOW (ref 8.6–10.2)
Chloride: 109 mmol/L — ABNORMAL HIGH (ref 96–106)
Creatinine, Ser: 0.98 mg/dL (ref 0.76–1.27)
GFR calc Af Amer: 85 mL/min/{1.73_m2} (ref >59)
GFR calc non Af Amer: 74 mL/min/{1.73_m2} (ref >59)
Globulin, Total: 1.6 g/dL (ref 1.5–4.5)
Glucose: 101 mg/dL — ABNORMAL HIGH (ref 65–99)
Potassium: 4.4 mmol/L (ref 3.5–5.2)
Sodium: 142 mmol/L (ref 134–144)
Total Protein: 5.4 g/dL — ABNORMAL LOW (ref 6.0–8.5)

## 2019-03-19 LAB — LIPID PANEL W/O CHOL/HDL RATIO
Cholesterol, Total: 103 mg/dL (ref 100–199)
HDL: 32 mg/dL — ABNORMAL LOW (ref >39)
LDL Chol Calc (NIH): 53 mg/dL (ref 0–99)
Triglycerides: 91 mg/dL (ref 0–149)
VLDL Cholesterol Cal: 18 mg/dL (ref 5–40)

## 2019-03-27 DIAGNOSIS — L821 Other seborrheic keratosis: Secondary | ICD-10-CM | POA: Diagnosis not present

## 2019-03-27 DIAGNOSIS — Z85828 Personal history of other malignant neoplasm of skin: Secondary | ICD-10-CM | POA: Diagnosis not present

## 2019-03-28 ENCOUNTER — Ambulatory Visit (INDEPENDENT_AMBULATORY_CARE_PROVIDER_SITE_OTHER): Payer: Medicare Other | Admitting: Family Medicine

## 2019-03-28 ENCOUNTER — Encounter: Payer: Self-pay | Admitting: Family Medicine

## 2019-03-28 ENCOUNTER — Other Ambulatory Visit: Payer: Self-pay

## 2019-03-28 VITALS — BP 141/84 | HR 63 | Temp 98.1°F

## 2019-03-28 DIAGNOSIS — Z23 Encounter for immunization: Secondary | ICD-10-CM

## 2019-03-28 DIAGNOSIS — H6982 Other specified disorders of Eustachian tube, left ear: Secondary | ICD-10-CM

## 2019-03-28 DIAGNOSIS — I639 Cerebral infarction, unspecified: Secondary | ICD-10-CM

## 2019-03-28 MED ORDER — AZELASTINE HCL 0.1 % NA SOLN: 1.0000 | Freq: Two times a day (BID) | NASAL | 0 refills | Status: DC

## 2019-03-28 MED ORDER — PREDNISONE 50 MG PO TABS: ORAL_TABLET | ORAL | 0 refills | Status: DC

## 2019-03-28 NOTE — Patient Instructions (Signed)

## 2019-03-28 NOTE — Progress Notes (Signed)
BP (!) 141/84   Pulse 63   Temp 98.1 F (36.7 C)   SpO2 98%    Subjective:    Patient ID: [name], male    DOB: Jan 23, 1941, 79 y.o.   MRN: [id number]  HPI: [name] is a 78 y.o. male  Chief Complaint  Patient presents with  . Ear Pain   Patient presenting today for persistent left ear muffled hearing and pressure. Not seeming to improve on flonase, prednisone or any OTC remedies tried. Had ear lavaged last visit which did not have lasting benefit. Denies sharp pain, fever, drainage, headache.   Relevant past medical, surgical, family and social history reviewed and updated as indicated. Interim medical history since our last visit reviewed. Allergies and medications reviewed and updated.  Review of Systems  Per HPI unless specifically indicated above     Objective:    BP (!) 141/84   Pulse 63   Temp 98.1 F (36.7 C)   SpO2 98%   Wt Readings from Last 3 Encounters:  01/30/19 189 lb (85.7 kg)  01/14/19 189 lb 9.5 oz (86 kg)  07/04/18 190 lb 7.6 oz (86.4 kg)    Physical Exam Vitals signs and nursing note reviewed.  Constitutional:      Appearance: Normal appearance.  HENT:     Head: Atraumatic.     Ears:     Comments: Left middle ear effusion present Eyes:     Extraocular Movements: Extraocular movements intact.     Conjunctiva/sclera: Conjunctivae normal.  Neck:     Musculoskeletal: Normal range of motion and neck supple.  Cardiovascular:     Rate and Rhythm: Normal rate and regular rhythm.  Pulmonary:     Effort: Pulmonary effort is normal.     Breath sounds: Normal breath sounds.  Musculoskeletal: Normal range of motion.  Skin:    General: Skin is warm and dry.  Neurological:     General: No focal deficit present.     Mental Status: He is oriented to person, place, and time.  Psychiatric:        Mood and Affect: Mood normal.        Thought Content: Thought content normal.        Judgment: Judgment normal.     Results for orders placed  or performed in visit on 03/12/19  Comprehensive metabolic panel  Result Value Ref Range   Glucose 101 (H) 65 - 99 mg/dL   BUN 19 8 - 27 mg/dL   Creatinine, Ser 0.98 0.76 - 1.27 mg/dL   GFR calc non Af Amer 74 >59 mL/min/1.73   GFR calc Af Amer 85 >59 mL/min/1.73   BUN/Creatinine Ratio 19 10 - 24   Sodium 142 134 - 144 mmol/L   Potassium 4.4 3.5 - 5.2 mmol/L   Chloride 109 (H) 96 - 106 mmol/L   CO2 21 20 - 29 mmol/L   Calcium 8.3 (L) 8.6 - 10.2 mg/dL   Total Protein 5.4 (L) 6.0 - 8.5 g/dL   Albumin 3.8 3.7 - 4.7 g/dL   Globulin, Total 1.6 1.5 - 4.5 g/dL   Albumin/Globulin Ratio 2.4 (H) 1.2 - 2.2   Bilirubin Total 0.9 0.0 - 1.2 mg/dL   Alkaline Phosphatase 101 39 - 117 IU/L   AST 22 0 - 40 IU/L   ALT 29 0 - 44 IU/L  Lipid Panel w/o Chol/HDL Ratio  Result Value Ref Range   Cholesterol, Total 103 100 - 199 mg/dL  Triglycerides 91 0 - 149 mg/dL   HDL 32 (L) >39 mg/dL   VLDL Cholesterol Cal 18 5 - 40 mg/dL   LDL Chol Calc (NIH) 53 0 - 99 mg/dL      Assessment & Plan:   Problem List Items Addressed This Visit    None    Visit Diagnoses    Dysfunction of left eustachian tube    -  Primary   Will restart prednisone burst, continue flonase and antihistamine, and add astelin nasal spray. Refer to ENT if not improving   Immunization due       Relevant Orders   Flu Vaccine QUAD High Dose(Fluad) (Completed)       Follow up plan: Return if symptoms worsen or fail to improve.

## 2019-04-04 DIAGNOSIS — H9122 Sudden idiopathic hearing loss, left ear: Secondary | ICD-10-CM | POA: Diagnosis not present

## 2019-04-04 DIAGNOSIS — H903 Sensorineural hearing loss, bilateral: Secondary | ICD-10-CM | POA: Diagnosis not present

## 2019-04-18 ENCOUNTER — Other Ambulatory Visit: Payer: Self-pay | Admitting: Unknown Physician Specialty

## 2019-04-18 ENCOUNTER — Other Ambulatory Visit: Payer: Self-pay | Admitting: Nurse Practitioner

## 2019-04-18 DIAGNOSIS — H9122 Sudden idiopathic hearing loss, left ear: Secondary | ICD-10-CM | POA: Diagnosis not present

## 2019-04-18 DIAGNOSIS — H903 Sensorineural hearing loss, bilateral: Secondary | ICD-10-CM | POA: Diagnosis not present

## 2019-04-18 NOTE — Telephone Encounter (Signed)
Routing to provider  

## 2019-04-29 ENCOUNTER — Ambulatory Visit
Admission: RE | Admit: 2019-04-29 | Discharge: 2019-04-29 | Disposition: A | Discharge status: Home / Self Care | Payer: Medicare Other | Source: Ambulatory Visit | Attending: Unknown Physician Specialty | Admitting: Unknown Physician Specialty

## 2019-04-29 ENCOUNTER — Other Ambulatory Visit: Payer: Self-pay

## 2019-04-29 DIAGNOSIS — H9122 Sudden idiopathic hearing loss, left ear: Secondary | ICD-10-CM | POA: Diagnosis not present

## 2019-04-29 DIAGNOSIS — H9192 Unspecified hearing loss, left ear: Secondary | ICD-10-CM | POA: Diagnosis not present

## 2019-04-29 LAB — POCT I-STAT CREATININE: Creatinine, Ser: 1.3 mg/dL — ABNORMAL HIGH (ref 0.61–1.24)

## 2019-04-29 MED ORDER — GADOBUTROL 1 MMOL/ML IV SOLN
8.0000 mL | Freq: Once | INTRAVENOUS | Status: AC | PRN
  Administered 2019-04-29: 8 mL via INTRAVENOUS

## 2019-05-22 ENCOUNTER — Other Ambulatory Visit: Payer: Self-pay | Admitting: Family Medicine

## 2019-06-10 DIAGNOSIS — M5136 Other intervertebral disc degeneration, lumbar region: Secondary | ICD-10-CM | POA: Diagnosis not present

## 2019-06-10 DIAGNOSIS — M9903 Segmental and somatic dysfunction of lumbar region: Secondary | ICD-10-CM | POA: Diagnosis not present

## 2019-06-10 DIAGNOSIS — M5416 Radiculopathy, lumbar region: Secondary | ICD-10-CM | POA: Diagnosis not present

## 2019-06-10 DIAGNOSIS — M62830 Muscle spasm of back: Secondary | ICD-10-CM | POA: Diagnosis not present

## 2019-06-12 DIAGNOSIS — M5416 Radiculopathy, lumbar region: Secondary | ICD-10-CM | POA: Diagnosis not present

## 2019-06-12 DIAGNOSIS — M5136 Other intervertebral disc degeneration, lumbar region: Secondary | ICD-10-CM | POA: Diagnosis not present

## 2019-06-12 DIAGNOSIS — M9903 Segmental and somatic dysfunction of lumbar region: Secondary | ICD-10-CM | POA: Diagnosis not present

## 2019-06-12 DIAGNOSIS — M62830 Muscle spasm of back: Secondary | ICD-10-CM | POA: Diagnosis not present

## 2019-06-17 DIAGNOSIS — M9903 Segmental and somatic dysfunction of lumbar region: Secondary | ICD-10-CM | POA: Diagnosis not present

## 2019-06-17 DIAGNOSIS — M5136 Other intervertebral disc degeneration, lumbar region: Secondary | ICD-10-CM | POA: Diagnosis not present

## 2019-06-17 DIAGNOSIS — M62830 Muscle spasm of back: Secondary | ICD-10-CM | POA: Diagnosis not present

## 2019-06-17 DIAGNOSIS — M5416 Radiculopathy, lumbar region: Secondary | ICD-10-CM | POA: Diagnosis not present

## 2019-06-18 ENCOUNTER — Other Ambulatory Visit: Payer: Self-pay | Admitting: Family Medicine

## 2019-06-19 DIAGNOSIS — M5416 Radiculopathy, lumbar region: Secondary | ICD-10-CM | POA: Diagnosis not present

## 2019-06-19 DIAGNOSIS — M62830 Muscle spasm of back: Secondary | ICD-10-CM | POA: Diagnosis not present

## 2019-06-19 DIAGNOSIS — M5136 Other intervertebral disc degeneration, lumbar region: Secondary | ICD-10-CM | POA: Diagnosis not present

## 2019-06-19 DIAGNOSIS — M9903 Segmental and somatic dysfunction of lumbar region: Secondary | ICD-10-CM | POA: Diagnosis not present

## 2019-06-21 ENCOUNTER — Telehealth: Payer: Self-pay | Admitting: Family Medicine

## 2019-06-21 DIAGNOSIS — H6982 Other specified disorders of Eustachian tube, left ear: Secondary | ICD-10-CM

## 2019-06-21 NOTE — Telephone Encounter (Signed)
Wife would like 2nd opinion for ENT

## 2019-06-24 DIAGNOSIS — M5416 Radiculopathy, lumbar region: Secondary | ICD-10-CM | POA: Diagnosis not present

## 2019-06-24 DIAGNOSIS — M9903 Segmental and somatic dysfunction of lumbar region: Secondary | ICD-10-CM | POA: Diagnosis not present

## 2019-06-24 DIAGNOSIS — M5136 Other intervertebral disc degeneration, lumbar region: Secondary | ICD-10-CM | POA: Diagnosis not present

## 2019-06-24 DIAGNOSIS — M62830 Muscle spasm of back: Secondary | ICD-10-CM | POA: Diagnosis not present

## 2019-06-26 DIAGNOSIS — M9903 Segmental and somatic dysfunction of lumbar region: Secondary | ICD-10-CM | POA: Diagnosis not present

## 2019-06-26 DIAGNOSIS — M62830 Muscle spasm of back: Secondary | ICD-10-CM | POA: Diagnosis not present

## 2019-06-26 DIAGNOSIS — M5416 Radiculopathy, lumbar region: Secondary | ICD-10-CM | POA: Diagnosis not present

## 2019-06-26 DIAGNOSIS — M5136 Other intervertebral disc degeneration, lumbar region: Secondary | ICD-10-CM | POA: Diagnosis not present

## 2019-07-01 DIAGNOSIS — M62830 Muscle spasm of back: Secondary | ICD-10-CM | POA: Diagnosis not present

## 2019-07-01 DIAGNOSIS — M9903 Segmental and somatic dysfunction of lumbar region: Secondary | ICD-10-CM | POA: Diagnosis not present

## 2019-07-01 DIAGNOSIS — M5416 Radiculopathy, lumbar region: Secondary | ICD-10-CM | POA: Diagnosis not present

## 2019-07-01 DIAGNOSIS — M5136 Other intervertebral disc degeneration, lumbar region: Secondary | ICD-10-CM | POA: Diagnosis not present

## 2019-07-03 DIAGNOSIS — M5416 Radiculopathy, lumbar region: Secondary | ICD-10-CM | POA: Diagnosis not present

## 2019-07-03 DIAGNOSIS — M9903 Segmental and somatic dysfunction of lumbar region: Secondary | ICD-10-CM | POA: Diagnosis not present

## 2019-07-03 DIAGNOSIS — M62830 Muscle spasm of back: Secondary | ICD-10-CM | POA: Diagnosis not present

## 2019-07-03 DIAGNOSIS — M5136 Other intervertebral disc degeneration, lumbar region: Secondary | ICD-10-CM | POA: Diagnosis not present

## 2019-07-14 NOTE — Progress Notes (Signed)
[facility]  [address] [place], [place] [address] Phone: [phone]  Fax: [phone]   Telemedicine Office Visit:  07/17/2019  Referring physician: [name], DO  I connected with [name] on 07/17/19 at 1:07 PM EDT by videoconferencing and verified that I was speaking with the correct person using 2 identifiers.  The patient was at home.  I discussed the limitations, risk, security and privacy concerns of performing an evaluation and management service by videoconferencing and  the availability of in person appointments.  I also discussed with the patient that there may be a patient responsible charge related to this service.  The patient expressed understanding and agreed to proceed.    Chief Complaint:  [name] is a 79 y.o. 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   HPI:  The patient was last seen in the medical oncology clinic on 01/14/2019. At that time,  he was doing well. Exam was stable. Hematocrit was 29.8, hemoglobin was 9.7, MCV 94.3, platelets 200,000, WBC 2800, ANC 1400. LDH was 396.  Creatinine was 1.25.   He was seen for suspicious lesion on his ear by Dr. Charlyne Francis on 03/27/2019. He had some seborrheic keratoses scattered across his face. He was to follow up in 1 year.   He was seen for left ear muffled hearing bye Christian Roof, PA-C on 03/28/2019. Symptoms did not improve with Flonase or any OTC remedies.  He was seen by Dr. Tami Francis in 6 weeks ago.  He was put on an prednisone for 2 weeks.  He has had improved 50% improvement in his left ear.  He notes that his ear feels congested/full.  Head MRI on 04/29/2019 showed no evidence of acute intracranial abnormality. There was no cerebellopontine angle mass or internal auditory canal lesion.  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  There was mild chronic small vessel ischemic changes within the pons. There was  moderate-sized right maxillary sinus mucous retention cyst.  During the interim, he has felt "fine".  His energy level is good.  He missed his appointment at [facility].  He has been working every day.  He only notes pain in his right shoulder following a fall years ago.   Past Medical History:  Diagnosis Date  . Arthritis   . Benign hypertensive renal disease   . GERD (gastroesophageal reflux disease)   . Heartburn   . History of retinal detachment   . Hyperlipidemia   . Hypertension   . Melanoma ([place])    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 approximately 10-15 years ago  . Myelofibrosis ([place])   . Prostate hypertrophy   . Stroke [facility]) June 2009   R brain subcortical infarct    Past Surgical History:  Procedure Laterality Date  . ASPIRATION / INJECTION RENAL CYST  Feb 2017  . BACK SURGERY     approx 20- 25 years ago  . GAS INSERTION  08/11/2011   Procedure: INSERTION OF GAS;  Surgeon: Christian Pedro, MD;  Location: [facility];  Service: Ophthalmology;  Laterality: Right;  [id number]  . SCLERAL BUCKLE  08/11/2011   Procedure: SCLERAL BUCKLE;  Surgeon: Christian Pedro, MD;  Location: [facility];  Service: Ophthalmology;  Laterality: Right;  Marland Kitchen VARICOSE VEIN SURGERY      Family History  Problem Relation Age of Onset  . Heart disease Father   . Diabetes Son   . Kidney disease Neg  Hx   . Prostate cancer Neg Hx     Social History:  reports that he quit smoking about 9 years ago. His smoking use included cigarettes. He smoked 0.25 packs per day. He has never used smokeless tobacco. He reports that he does not drink alcohol or use drugs. He works full-time as a Animator. He lives in [place].  The patient is accompanied by his wife today.  Participants in the patient's visit and their role in the encounter included the patient, his wife, and [name], Therapist, sports.  The intake visit was provided by Christian Och, RN.   Allergies:  Allergies  Allergen Reactions  . Meloxicam Nausea And Vomiting     Current Medications: Current Outpatient Medications  Medication Sig Dispense Refill  . azelastine (ASTELIN) 0.1 % nasal spray Place 1 spray into both nostrils 2 (two) times daily. Use in each nostril as directed 30 mL 0  . clopidogrel (PLAVIX) 75 MG tablet Take 1 tablet (75 mg total) by mouth daily. 90 tablet 3  . diclofenac sodium (VOLTAREN) 1 % GEL Apply 4 g topically 4 (four) times daily as needed (for neck pain). 100 g 6  . fluticasone (FLONASE) 50 MCG/ACT nasal spray TAKE 1 SPRAY INTO BOTH NOSTRILS TWICE DAILY 16 g 0  . gabapentin (NEURONTIN) 300 MG capsule Take 1 capsule (300 mg total) by mouth 3 (three) times daily. 270 capsule 1  . Ginsengs-Saw Palmetto (MULTI GINSENG & SAW PALMETTO) 500 MG CAPS Take 500 mg by mouth daily.    Marland Kitchen losartan (COZAAR) 100 MG tablet Take 1 tablet (100 mg total) by mouth daily. 90 tablet 1  . Multiple Vitamin (MULITIVITAMIN WITH MINERALS) TABS Take 1 tablet by mouth daily.    . Naproxen Sodium (ALEVE) 220 MG CAPS Take by mouth as needed.     Marland Kitchen omeprazole (PRILOSEC) 40 MG capsule TAKE 1 CAPSULE BY MOUTH ONCE DAILY 90 capsule 0  . simvastatin (ZOCOR) 40 MG tablet TAKE 1 TABLET BY MOUTH DAILY 90 tablet 0  . tamsulosin (FLOMAX) 0.4 MG CAPS capsule Take 1 capsule (0.4 mg total) by mouth daily. 90 capsule 3  . zinc gluconate 50 MG tablet Take by mouth.    . meclizine (ANTIVERT) 25 MG tablet TAKE 1 TABLET BY MOUTH 3 TIMES DAILY AS NEEDED FOR DIZZINESS (Patient not taking: Reported on 07/16/2019) 90 tablet 0  . predniSONE (DELTASONE) 50 MG tablet Take 1 tab daily with breakfast (Patient not taking: Reported on 07/16/2019) 3 tablet 0   No current facility-administered medications for this visit.    Review of Systems  Constitutional: Negative.  Negative for chills, diaphoresis, fever, malaise/fatigue and weight loss (no new weight).       Feels "fine."   HENT: Negative.  Negative for congestion, ear pain, hearing loss, nosebleeds, sinus pain and sore throat.         Left ear congestion- improved  Eyes: Negative.  Negative for blurred vision, double vision and photophobia.  Respiratory: Negative.  Negative for cough, sputum production, shortness of breath and wheezing.   Cardiovascular: Negative.  Negative for chest pain, palpitations, orthopnea, leg swelling and PND.  Gastrointestinal: Negative.  Negative for abdominal pain, blood in stool, constipation, diarrhea, melena, nausea and vomiting.  Genitourinary: Negative for dysuria, frequency, hematuria and urgency.       BPH.  Musculoskeletal: Positive for joint pain (arthritis, left knee; right shoulder-old). Negative for back pain and myalgias.  Skin: Negative.  Negative for rash.  Neurological: Negative.  Negative  for dizziness, tingling, sensory change, weakness and headaches.  Endo/Heme/Allergies: Negative.  Does not bruise/bleed easily.  Psychiatric/Behavioral: Negative.  Negative for depression, memory loss and substance abuse. The patient is not nervous/anxious and does not have insomnia.   All other systems reviewed and are negative.  Performance status (ECOG):  0-1  Vitals There were no vitals taken for this visit.   Physical Exam  Constitutional: He is oriented to person, place, and time. He appears well-developed and well-nourished. No distress.  HENT:  Head: Normocephalic and atraumatic.  Short gray hair.  Eyes: Conjunctivae and EOM are normal. No scleral icterus.  Blue eyes.   Neurological: He is alert and oriented to person, place, and time.  Skin: He is not diaphoretic.  Psychiatric: He has a normal mood and affect. His behavior is normal. Judgment and thought content normal.  Nursing note reviewed.   Appointment on 07/16/2019  Component Date Value Ref Range Status  . LDH 07/16/2019 422* 98 - 192 U/L Final   Performed at [facility], [address], [place], [place] [address]  . Sodium 07/16/2019 139  135 - 145 mmol/L Final  . Potassium 07/16/2019 4.3  3.5 - 5.1  mmol/L Final  . Chloride 07/16/2019 107  98 - 111 mmol/L Final  . CO2 07/16/2019 24  22 - 32 mmol/L Final  . Glucose, Bld 07/16/2019 95  70 - 99 mg/dL Final  . BUN 07/16/2019 17  8 - 23 mg/dL Final  . Creatinine, Ser 07/16/2019 1.09  0.61 - 1.24 mg/dL Final  . Calcium 07/16/2019 8.7* 8.9 - 10.3 mg/dL Final  . Total Protein 07/16/2019 6.2* 6.5 - 8.1 g/dL Final  . Albumin 07/16/2019 3.7  3.5 - 5.0 g/dL Final  . AST 07/16/2019 26  15 - 41 U/L Final  . ALT 07/16/2019 21  0 - 44 U/L Final  . Alkaline Phosphatase 07/16/2019 96  38 - 126 U/L Final  . Total Bilirubin 07/16/2019 1.0  0.3 - 1.2 mg/dL Final  . GFR calc non Af Amer 07/16/2019 >60  >60 mL/min Final  . GFR calc Af Amer 07/16/2019 >60  >60 mL/min Final  . Anion gap 07/16/2019 8  5 - 15 Final   Performed at [facility], [address] [place], [place] [address]  . WBC 07/16/2019 2.8* 4.0 - 10.5 K/uL Final  . RBC 07/16/2019 3.04* 4.22 - 5.81 MIL/uL Final  . Hemoglobin 07/16/2019 9.2* 13.0 - 17.0 g/dL Final  . HCT 07/16/2019 28.8* 39.0 - 52.0 % Final  . MCV 07/16/2019 94.7  80.0 - 100.0 fL Final  . MCH 07/16/2019 30.3  26.0 - 34.0 pg Final  . MCHC 07/16/2019 31.9  30.0 - 36.0 g/dL Final  . RDW 07/16/2019 16.3* 11.5 - 15.5 % Final  . Platelets 07/16/2019 209  150 - 400 K/uL Final  . nRBC 07/16/2019 2.5* 0.0 - 0.2 % Final  . Neutrophils Relative % 07/16/2019 54  % Final  . Neutro Abs 07/16/2019 1.5* 1.7 - 7.7 K/uL Final  . Lymphocytes Relative 07/16/2019 23  % Final  . Lymphs Abs 07/16/2019 0.6* 0.7 - 4.0 K/uL Final  . Monocytes Relative 07/16/2019 15  % Final  . Monocytes Absolute 07/16/2019 0.4  0.1 - 1.0 K/uL Final  . Eosinophils Relative 07/16/2019 0  % Final  . Eosinophils Absolute 07/16/2019 0.0  0.0 - 0.5 K/uL Final  . Basophils Relative 07/16/2019 0  % Final  . Basophils Absolute 07/16/2019 0.0  0.0 -  0.1 K/uL Final  . Immature Granulocytes 07/16/2019 8  % Final  . Abs Immature Granulocytes 07/16/2019 0.23*  0.00 - 0.07 K/uL Final   Performed at [facility], [address] [place], [place] [address]    Assessment:  [name] is a 79 y.o. 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 He presented with a long history of normocytic anemia unresponsive to oral iron. He has mild leukopenia. He has mild splenomegaly (15.8 cm) by abdominal ultrasound on 06/04/2015. DIPPS score is 61 (age 29- 1, hemoglobin less than 10- 2) and score of 4 if 1% circulating blasts included from 07/17/2014.  Bone marrowon 06/11/2014 was most consistent with primary myelofibrosis. Bone marrow biopsy showed 1% abnormal cells: CD45+, CD5+, CD10, CD11c+/-, CD19+, CD2-+, (dim), CD22+ (dim, CD23+, CD38-/+, FMC7-, HLA-DR+, sig lambda+(dim).Blasts were not increased 1.2%; hypercellular for age: 54%; [id number] mutation was negative. CALR mutationpositive. 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 MDS FISH panel was negative.  Labs in 12/2015included the following normal studies: direct [place], epo level, haptoglobin, ANA, hemoglobin electrophoresis, hepatitis B surface antigen, HIV testing, UPEP. 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 LDH was elevated (520). Hepatitis C by PCR was less than 2 IU/ml.   Symptomatically, he feels "fine".  He denies any fevers, sweats or weight loss.  He has had issues with left ear congestion- improved with steroids.  Plan: 1.   Review labs from 07/16/2019. 2. Primary myelofibrosis Clinically, he is doing well.               He remains asymptomatic.               Hematocrit 28.8.  Hemoglobin 9.2.  MCV 94.7.  WBC 2800 with an ANC of 1500.  Counts are relatively stable.   3.   Normocytic anemia  Hemoglobin has drifted down from 9.7  to 9.2.  Patient denies any bleeding.  Additional labs today: ferritin, iron studies B12, folate. 4.   Continue fever and neutropenia precautions.   5.   Continue alternating visits with [facility] in order that he is seen every 3 months.   6.   RTC in 6 months for MD assessment and labs (CBC with diff, CMP, LDH).  Addendum:  Ferritin was 251 with an iron saturation of 22% and a TIBC of 242.  B12 was 248 (low) and folate 42 (normal).  Patient was contacted on 92/04/2020 and instructed to begin oral B12 at 1000 mcg a day.  CBC and B12 will be checked in 1 month.  I discussed the assessment and treatment plan with the patient.  The patient was provided an opportunity to ask questions and all were answered.  The patient agreed with the plan and demonstrated an understanding of the instructions.  The patient was advised to call back if the symptoms worsen or if the condition fails to improve as anticipated.   Lequita Asal, MD, PhD  07/17/2019, 1:07 PM

## 2019-07-16 ENCOUNTER — Other Ambulatory Visit: Payer: Self-pay

## 2019-07-16 ENCOUNTER — Encounter: Payer: Self-pay | Admitting: Hematology and Oncology

## 2019-07-16 ENCOUNTER — Inpatient Hospital Stay: Payer: Medicare Other | Attending: Hematology and Oncology

## 2019-07-16 ENCOUNTER — Other Ambulatory Visit: Payer: Self-pay | Admitting: Family Medicine

## 2019-07-16 DIAGNOSIS — D7581 Myelofibrosis: Secondary | ICD-10-CM | POA: Diagnosis not present

## 2019-07-16 DIAGNOSIS — D649 Anemia, unspecified: Secondary | ICD-10-CM | POA: Insufficient documentation

## 2019-07-16 LAB — CBC WITH DIFFERENTIAL/PLATELET
Abs Immature Granulocytes: 0.23 10*3/uL — ABNORMAL HIGH (ref 0.00–0.07)
Basophils Absolute: 0 10*3/uL (ref 0.0–0.1)
Basophils Relative: 0 %
Eosinophils Absolute: 0 10*3/uL (ref 0.0–0.5)
Eosinophils Relative: 0 %
HCT: 28.8 % — ABNORMAL LOW (ref 39.0–52.0)
Hemoglobin: 9.2 g/dL — ABNORMAL LOW (ref 13.0–17.0)
Immature Granulocytes: 8 %
Lymphocytes Relative: 23 %
Lymphs Abs: 0.6 10*3/uL — ABNORMAL LOW (ref 0.7–4.0)
MCH: 30.3 pg (ref 26.0–34.0)
MCHC: 31.9 g/dL (ref 30.0–36.0)
MCV: 94.7 fL (ref 80.0–100.0)
Monocytes Absolute: 0.4 10*3/uL (ref 0.1–1.0)
Monocytes Relative: 15 %
Neutro Abs: 1.5 10*3/uL — ABNORMAL LOW (ref 1.7–7.7)
Neutrophils Relative %: 54 %
Platelets: 209 10*3/uL (ref 150–400)
RBC: 3.04 MIL/uL — ABNORMAL LOW (ref 4.22–5.81)
RDW: 16.3 % — ABNORMAL HIGH (ref 11.5–15.5)
WBC: 2.8 10*3/uL — ABNORMAL LOW (ref 4.0–10.5)
nRBC: 2.5 % — ABNORMAL HIGH (ref 0.0–0.2)

## 2019-07-16 LAB — COMPREHENSIVE METABOLIC PANEL
ALT: 21 U/L (ref 0–44)
AST: 26 U/L (ref 15–41)
Albumin: 3.7 g/dL (ref 3.5–5.0)
Alkaline Phosphatase: 96 U/L (ref 38–126)
Anion gap: 8 (ref 5–15)
BUN: 17 mg/dL (ref 8–23)
CO2: 24 mmol/L (ref 22–32)
Calcium: 8.7 mg/dL — ABNORMAL LOW (ref 8.9–10.3)
Chloride: 107 mmol/L (ref 98–111)
Creatinine, Ser: 1.09 mg/dL (ref 0.61–1.24)
GFR calc Af Amer: >60 mL/min (ref >60)
GFR calc non Af Amer: >60 mL/min (ref >60)
Glucose, Bld: 95 mg/dL (ref 70–99)
Potassium: 4.3 mmol/L (ref 3.5–5.1)
Sodium: 139 mmol/L (ref 135–145)
Total Bilirubin: 1 mg/dL (ref 0.3–1.2)
Total Protein: 6.2 g/dL — ABNORMAL LOW (ref 6.5–8.1)

## 2019-07-16 LAB — LACTATE DEHYDROGENASE: LDH: 422 U/L — ABNORMAL HIGH (ref 98–192)

## 2019-07-16 NOTE — Progress Notes (Signed)
Patient states that he lost hearing in his left ear, he was told that it is because "a virus attacked my left ear"

## 2019-07-17 ENCOUNTER — Inpatient Hospital Stay (HOSPITAL_BASED_OUTPATIENT_CLINIC_OR_DEPARTMENT_OTHER): Payer: Medicare Other | Admitting: Hematology and Oncology

## 2019-07-17 ENCOUNTER — Encounter: Payer: Self-pay | Admitting: Hematology and Oncology

## 2019-07-17 ENCOUNTER — Other Ambulatory Visit: Payer: Medicare Other

## 2019-07-17 DIAGNOSIS — D7581 Myelofibrosis: Secondary | ICD-10-CM

## 2019-07-17 DIAGNOSIS — D649 Anemia, unspecified: Secondary | ICD-10-CM

## 2019-07-17 DIAGNOSIS — D474 Osteomyelofibrosis: Secondary | ICD-10-CM | POA: Diagnosis not present

## 2019-07-17 LAB — TSH: TSH: 3.65 u[IU]/mL (ref 0.350–4.500)

## 2019-07-17 LAB — IRON AND TIBC
Iron: 54 ug/dL (ref 45–182)
Saturation Ratios: 22 % (ref 17.9–39.5)
TIBC: 242 ug/dL — ABNORMAL LOW (ref 250–450)
UIBC: 188 ug/dL

## 2019-07-17 LAB — FERRITIN: Ferritin: 251 ng/mL (ref 24–336)

## 2019-07-17 LAB — FOLATE: Folate: 42 ng/mL (ref >5.9)

## 2019-07-17 LAB — VITAMIN B12: Vitamin B-12: 248 pg/mL (ref 180–914)

## 2019-07-18 ENCOUNTER — Telehealth: Payer: Self-pay

## 2019-07-18 DIAGNOSIS — E538 Deficiency of other specified B group vitamins: Secondary | ICD-10-CM

## 2019-07-18 NOTE — Telephone Encounter (Signed)
Spoke with [name] to inform him per Dr Mike Gip that his B-12 levels are low and she would like for him to start taking oral B-12 1000 mcg daily and check CBC with diff and B-12 levels in 1 month. lab schedule 08/19/2019 @ 9:00 AM. The patient was understanding and agreeable.

## 2019-07-18 NOTE — Telephone Encounter (Signed)
-----   Message from Lequita Asal, MD sent at 07/18/2019 12:55 PM EST ----- Regarding: Please call patient  B12 is low.  Begin oral B12 1000 mcg a day.  Check CBC with diff and B12 in 1 month.  M ----- Message ----- From: Buel Ream, [facility] [place] Sent: 07/16/2019   2:21 PM EST To: Lequita Asal, MD

## 2019-07-23 ENCOUNTER — Other Ambulatory Visit: Payer: Self-pay | Admitting: Family Medicine

## 2019-07-23 NOTE — Telephone Encounter (Signed)
Requested medication (s) are due for refill today: no  Requested medication (s) are on the active medication list: yes  Last refill:  04/18/2019  Future visit scheduled: no  Notes to clinic:  this refill cannot be delegated   Requested Prescriptions  Pending Prescriptions Disp Refills   meclizine (ANTIVERT) 25 MG tablet [Pharmacy Med Name: MECLIZINE HCL 25 MG TAB] 90 tablet 0    Sig: TAKE 1 TABLET BY MOUTH 3 TIMES DAILY AS NEEDED FOR DIZZINESS      Not Delegated - Gastroenterology: Antiemetics Failed - 07/23/2019 10:04 AM      Failed - This refill cannot be delegated      Passed - Valid encounter within last 6 months    Recent Outpatient Visits           3 months ago Dysfunction of left eustachian tube   [facility] Volney American, PA-C   4 months ago Impacted cerumen of left ear   [facility] Volney American, [place]   4 months ago Dysfunction of left eustachian tube   [facility], Megan P, DO   10 months ago Benign hypertensive renal disease   [facility] [place], [place], DO   1 year ago Benign hypertensive renal disease   [facility] [place], [place], DO

## 2019-07-30 DIAGNOSIS — H90A22 Sensorineural hearing loss, unilateral, left ear, with restricted hearing on the contralateral side: Secondary | ICD-10-CM | POA: Diagnosis not present

## 2019-08-02 DIAGNOSIS — H9120 Sudden idiopathic hearing loss, unspecified ear: Secondary | ICD-10-CM | POA: Diagnosis not present

## 2019-08-19 ENCOUNTER — Inpatient Hospital Stay: Payer: Medicare Other | Attending: Hematology and Oncology

## 2019-08-19 ENCOUNTER — Other Ambulatory Visit: Payer: Self-pay

## 2019-08-19 DIAGNOSIS — D7581 Myelofibrosis: Secondary | ICD-10-CM | POA: Insufficient documentation

## 2019-08-19 DIAGNOSIS — E538 Deficiency of other specified B group vitamins: Secondary | ICD-10-CM

## 2019-08-19 LAB — CBC WITH DIFFERENTIAL/PLATELET
Abs Immature Granulocytes: 0.19 10*3/uL — ABNORMAL HIGH (ref 0.00–0.07)
Basophils Absolute: 0 10*3/uL (ref 0.0–0.1)
Basophils Relative: 0 %
Eosinophils Absolute: 0 10*3/uL (ref 0.0–0.5)
Eosinophils Relative: 1 %
HCT: 30.8 % — ABNORMAL LOW (ref 39.0–52.0)
Hemoglobin: 9.9 g/dL — ABNORMAL LOW (ref 13.0–17.0)
Immature Granulocytes: 7 %
Lymphocytes Relative: 26 %
Lymphs Abs: 0.7 10*3/uL (ref 0.7–4.0)
MCH: 30.3 pg (ref 26.0–34.0)
MCHC: 32.1 g/dL (ref 30.0–36.0)
MCV: 94.2 fL (ref 80.0–100.0)
Monocytes Absolute: 0.4 10*3/uL (ref 0.1–1.0)
Monocytes Relative: 16 %
Neutro Abs: 1.3 10*3/uL — ABNORMAL LOW (ref 1.7–7.7)
Neutrophils Relative %: 50 %
Platelets: 208 10*3/uL (ref 150–400)
RBC: 3.27 MIL/uL — ABNORMAL LOW (ref 4.22–5.81)
RDW: 16.1 % — ABNORMAL HIGH (ref 11.5–15.5)
WBC: 2.6 10*3/uL — ABNORMAL LOW (ref 4.0–10.5)
nRBC: 2.3 % — ABNORMAL HIGH (ref 0.0–0.2)

## 2019-08-19 LAB — VITAMIN B12: Vitamin B-12: 687 pg/mL (ref 180–914)

## 2019-08-30 ENCOUNTER — Other Ambulatory Visit: Payer: Self-pay | Admitting: Family Medicine

## 2019-08-30 NOTE — Telephone Encounter (Signed)
Requested medication (s) are due for refill today - too soon  Requested medication (s) are on the active medication list yes  Future visit scheduled no  Last refill: 08/21/19  Notes to clinic: Medication fails lab protocol- sent for review  Requested Prescriptions  Pending Prescriptions Disp Refills   clopidogrel (PLAVIX) 75 MG tablet [Pharmacy Med Name: CLOPIDOGREL BISULFATE 75 MG TAB] 90 tablet 3    Sig: TAKE ONE TABLET BY MOUTH EVERY DAY      Hematology: Antiplatelets - clopidogrel Failed - 08/30/2019  3:35 PM      Failed - Evaluate AST, ALT within 2 months of therapy initiation.      Failed - HCT in normal range and within 180 days    HCT  Date Value Ref Range Status  08/19/2019 30.8 (L) 39.0 - 52.0 % Final   Hematocrit  Date Value Ref Range Status  09/05/2018 29.1 (L) 37.5 - 51.0 % Final          Failed - HGB in normal range and within 180 days    Hemoglobin  Date Value Ref Range Status  08/19/2019 9.9 (L) 13.0 - 17.0 g/dL Final  09/05/2018 9.7 (L) 13.0 - 17.7 g/dL Final          Passed - ALT in normal range and within 360 days    ALT  Date Value Ref Range Status  07/16/2019 21 0 - 44 U/L Final   SGPT (ALT)  Date Value Ref Range Status  06/18/2014 43 U/L Final    Comment:    14-63 NOTE: New Reference Range 12/24/13           Passed - AST in normal range and within 360 days    AST  Date Value Ref Range Status  07/16/2019 26 15 - 41 U/L Final   SGOT(AST)  Date Value Ref Range Status  06/18/2014 30 15 - 37 Unit/L Final          Passed - PLT in normal range and within 180 days    Platelets  Date Value Ref Range Status  08/19/2019 208 150 - 400 K/uL Final  09/05/2018 231 150 - 450 x10E3/uL Final          Passed - Valid encounter within last 6 months    Recent Outpatient Visits           5 months ago Dysfunction of left eustachian tube   [facility] Volney American, PA-C   5 months ago Impacted cerumen of left ear   [facility], [place], [place]   5 months ago Dysfunction of left eustachian tube   [facility], Megan P, DO   12 months ago Benign hypertensive renal disease   [facility] [place], [place], DO   1 year ago Benign hypertensive renal disease   [facility] [place], Megan P, DO                  Requested Prescriptions  Pending Prescriptions Disp Refills   clopidogrel (PLAVIX) 75 MG tablet [Pharmacy Med Name: CLOPIDOGREL BISULFATE 75 MG TAB] 90 tablet 3    Sig: TAKE ONE TABLET BY MOUTH EVERY DAY      Hematology: Antiplatelets - clopidogrel Failed - 08/30/2019  3:35 PM      Failed - Evaluate AST, ALT within 2 months of therapy initiation.      Failed - HCT in normal range and within 180 days  HCT  Date Value Ref Range Status  08/19/2019 30.8 (L) 39.0 - 52.0 % Final   Hematocrit  Date Value Ref Range Status  09/05/2018 29.1 (L) 37.5 - 51.0 % Final          Failed - HGB in normal range and within 180 days    Hemoglobin  Date Value Ref Range Status  08/19/2019 9.9 (L) 13.0 - 17.0 g/dL Final  09/05/2018 9.7 (L) 13.0 - 17.7 g/dL Final          Passed - ALT in normal range and within 360 days    ALT  Date Value Ref Range Status  07/16/2019 21 0 - 44 U/L Final   SGPT (ALT)  Date Value Ref Range Status  06/18/2014 43 U/L Final    Comment:    14-63 NOTE: New Reference Range 12/24/13           Passed - AST in normal range and within 360 days    AST  Date Value Ref Range Status  07/16/2019 26 15 - 41 U/L Final   SGOT(AST)  Date Value Ref Range Status  06/18/2014 30 15 - 37 Unit/L Final          Passed - PLT in normal range and within 180 days    Platelets  Date Value Ref Range Status  08/19/2019 208 150 - 400 K/uL Final  09/05/2018 231 150 - 450 x10E3/uL Final          Passed - Valid encounter within last 6 months    Recent Outpatient Visits           5 months ago Dysfunction of  left eustachian tube   [facility] Volney American, PA-C   5 months ago Impacted cerumen of left ear   [facility] Merrie Roof [place], [place]   5 months ago Dysfunction of left eustachian tube   [facility], Megan P, DO   12 months ago Benign hypertensive renal disease   [facility] [place], [place], DO   1 year ago Benign hypertensive renal disease   [facility] [place], [place], DO

## 2019-09-02 NOTE — Telephone Encounter (Signed)
Needs appointment

## 2019-09-02 NOTE — Telephone Encounter (Signed)
Patient last seen 03/12/19

## 2019-09-02 NOTE — Telephone Encounter (Signed)
LVM for pt to call back.

## 2019-09-03 NOTE — Telephone Encounter (Signed)
Appt scheduled for 09/05/19.

## 2019-09-05 ENCOUNTER — Ambulatory Visit (INDEPENDENT_AMBULATORY_CARE_PROVIDER_SITE_OTHER): Payer: Medicare Other | Admitting: Family Medicine

## 2019-09-05 ENCOUNTER — Other Ambulatory Visit: Payer: Self-pay

## 2019-09-05 ENCOUNTER — Encounter: Payer: Self-pay | Admitting: Family Medicine

## 2019-09-05 VITALS — BP 149/79 | HR 69 | Temp 98.4°F | Ht 67.32 in | Wt 191.6 lb

## 2019-09-05 DIAGNOSIS — D709 Neutropenia, unspecified: Secondary | ICD-10-CM

## 2019-09-05 DIAGNOSIS — D474 Osteomyelofibrosis: Secondary | ICD-10-CM

## 2019-09-05 DIAGNOSIS — D7581 Myelofibrosis: Secondary | ICD-10-CM | POA: Diagnosis not present

## 2019-09-05 DIAGNOSIS — I129 Hypertensive chronic kidney disease with stage 1 through stage 4 chronic kidney disease, or unspecified chronic kidney disease: Secondary | ICD-10-CM

## 2019-09-05 DIAGNOSIS — K219 Gastro-esophageal reflux disease without esophagitis: Secondary | ICD-10-CM | POA: Diagnosis not present

## 2019-09-05 DIAGNOSIS — J301 Allergic rhinitis due to pollen: Secondary | ICD-10-CM | POA: Diagnosis not present

## 2019-09-05 DIAGNOSIS — M1990 Unspecified osteoarthritis, unspecified site: Secondary | ICD-10-CM

## 2019-09-05 DIAGNOSIS — Z87438 Personal history of other diseases of male genital organs: Secondary | ICD-10-CM

## 2019-09-05 DIAGNOSIS — E782 Mixed hyperlipidemia: Secondary | ICD-10-CM

## 2019-09-05 LAB — MICROALBUMIN, URINE WAIVED
Creatinine, Urine Waived: 100 mg/dL (ref 10–300)
Microalb, Ur Waived: 150 mg/L — ABNORMAL HIGH (ref 0–19)
Microalb/Creat Ratio: >300 mg/g — ABNORMAL HIGH (ref <30)

## 2019-09-05 MED ORDER — FLUTICASONE PROPIONATE 50 MCG/ACT NA SUSP: NASAL | 12 refills | Status: DC

## 2019-09-05 MED ORDER — CLOPIDOGREL BISULFATE 75 MG PO TABS: 75.0000 mg | ORAL_TABLET | Freq: Every day | ORAL | 1 refills | Status: DC

## 2019-09-05 MED ORDER — OMEPRAZOLE 40 MG PO CPDR: 40.0000 mg | DELAYED_RELEASE_CAPSULE | Freq: Every day | ORAL | 1 refills | Status: DC

## 2019-09-05 MED ORDER — HYDROCHLOROTHIAZIDE 25 MG PO TABS: 25.0000 mg | ORAL_TABLET | Freq: Every day | ORAL | 0 refills | Status: DC

## 2019-09-05 MED ORDER — GABAPENTIN 300 MG PO CAPS: 300.0000 mg | ORAL_CAPSULE | Freq: Three times a day (TID) | ORAL | 1 refills | Status: DC

## 2019-09-05 MED ORDER — TADALAFIL 20 MG PO TABS: 10.0000 mg | ORAL_TABLET | ORAL | 11 refills | Status: DC | PRN

## 2019-09-05 MED ORDER — LOSARTAN POTASSIUM 100 MG PO TABS: 100.0000 mg | ORAL_TABLET | Freq: Every day | ORAL | 1 refills | Status: DC

## 2019-09-05 MED ORDER — TAMSULOSIN HCL 0.4 MG PO CAPS: 0.4000 mg | ORAL_CAPSULE | Freq: Every day | ORAL | 1 refills | Status: DC

## 2019-09-05 MED ORDER — SIMVASTATIN 40 MG PO TABS: ORAL_TABLET | ORAL | 1 refills | Status: DC

## 2019-09-05 NOTE — Assessment & Plan Note (Signed)
Under good control on current regimen. Continue current regimen. Continue to monitor. Call with any concerns. Refills given. Labs drawn today.   

## 2019-09-05 NOTE — Assessment & Plan Note (Addendum)
Still running a little high. Will start HCTZ and recheck 1 month.

## 2019-09-05 NOTE — Assessment & Plan Note (Signed)
Continue to follow with hematology. Call with any concerns.  

## 2019-09-05 NOTE — Progress Notes (Signed)
BP (!) 149/79 (BP Location: Left Arm, Patient Position: Sitting, Cuff Size: Normal)   Pulse 69   Temp 98.4 F (36.9 C) (Oral)   Ht 5' 7.32" (1.71 m)   Wt 191 lb 9.6 oz (86.9 kg)   SpO2 96%   BMI 29.72 kg/m    Subjective:    Patient ID: [name], male    DOB: Mar 29, 1941, 79 y.o.   MRN: [id number]  HPI: [name] is a 79 y.o. male  Chief Complaint  Patient presents with  . Hypertension  . Hyperlipidemia  . Gastroesophageal Reflux   HYPERTENSION / [place] Satisfied with current treatment? yes Duration of hypertension: chronic BP monitoring frequency: not checking BP medication side effects: no Duration of hyperlipidemia: chronic Cholesterol medication side effects: yes Cholesterol supplements: none Past cholesterol medications: simvastatin Medication compliance: excellent compliance Aspirin: no Recent stressors: no Recurrent headaches: no Visual changes: no Palpitations: no Dyspnea: no Chest pain: no Lower extremity edema: no Dizzy/lightheaded: no  GERD GERD control status: controlled  Satisfied with current treatment? yes Heartburn frequency:  Medication side effects: no  Medication compliance: excellent Dysphagia: no Odynophagia:  no Hematemesis: no Blood in stool: no EGD: no   Relevant past medical, surgical, family and social history reviewed and updated as indicated. Interim medical history since our last visit reviewed. Allergies and medications reviewed and updated.  Review of Systems  Per HPI unless specifically indicated above     Objective:    BP (!) 149/79 (BP Location: Left Arm, Patient Position: Sitting, Cuff Size: Normal)   Pulse 69   Temp 98.4 F (36.9 C) (Oral)   Ht 5' 7.32" (1.71 m)   Wt 191 lb 9.6 oz (86.9 kg)   SpO2 96%   BMI 29.72 kg/m   Wt Readings from Last 3 Encounters:  09/05/19 191 lb 9.6 oz (86.9 kg)  01/30/19 189 lb (85.7 kg)  01/14/19 189 lb 9.5 oz (86 kg)    Physical Exam Vitals and nursing  note reviewed.  Constitutional:      General: He is not in acute distress.    Appearance: Normal appearance. He is not ill-appearing, toxic-appearing or diaphoretic.  HENT:     Head: Normocephalic and atraumatic.     Right Ear: External ear normal.     Left Ear: External ear normal.     Nose: Nose normal.     Mouth/Throat:     Mouth: Mucous membranes are moist.     Pharynx: Oropharynx is clear.  Eyes:     General: No scleral icterus.       Right eye: No discharge.        Left eye: No discharge.     Extraocular Movements: Extraocular movements intact.     Conjunctiva/sclera: Conjunctivae normal.     Pupils: Pupils are equal, round, and reactive to light.  Cardiovascular:     Rate and Rhythm: Normal rate and regular rhythm.     Pulses: Normal pulses.     Heart sounds: Normal heart sounds. No murmur. No friction rub. No gallop.   Pulmonary:     Effort: Pulmonary effort is normal. No respiratory distress.     Breath sounds: Normal breath sounds. No stridor. No wheezing, rhonchi or rales.  Chest:     Chest wall: No tenderness.  Musculoskeletal:        General: Normal range of motion.     Cervical back: Normal range of motion and neck supple.  Skin:    General:  Skin is warm and dry.     Capillary Refill: Capillary refill takes less than 2 seconds.     Coloration: Skin is not jaundiced or pale.     Findings: No bruising, erythema, lesion or rash.  Neurological:     General: No focal deficit present.     Mental Status: He is alert and oriented to person, place, and time. Mental status is at baseline.  Psychiatric:        Mood and Affect: Mood normal.        Behavior: Behavior normal.        Thought Content: Thought content normal.        Judgment: Judgment normal.     Results for orders placed or performed in visit on 09/05/19  Microalbumin, Urine Waived  Result Value Ref Range   Microalb, Ur Waived 150 (H) 0 - 19 mg/L   Creatinine, Urine Waived 100 10 - 300 mg/dL    Microalb/Creat Ratio >300 (H) <30 mg/g      Assessment & Plan:   Problem List Items Addressed This Visit      Digestive   Gastroesophageal reflux disease    Under good control on current regimen. Continue current regimen. Continue to monitor. Call with any concerns. Refills given. Labs drawn today.       Relevant Medications   omeprazole (PRILOSEC) 40 MG capsule   Other Relevant Orders   CBC with Differential/Platelet   Comprehensive metabolic panel     Genitourinary   Benign hypertensive renal disease - Primary    Still running a little high. Will start HCTZ and recheck 1 month.       Relevant Orders   Comprehensive metabolic panel   Microalbumin, Urine Waived (Completed)     Other   Myelofibrosis ([place])    Continue to follow with hematology. Call with any concerns.       History of BPH    Under good control on current regimen. Continue current regimen. Continue to monitor. Call with any concerns. Refills given. Labs drawn today.      Neutropenia ([place])    Continue to follow with hematology. Call with any concerns.       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)    Continue to follow with hematology. Call with any concerns.       Mixed hyperlipidemia    Under good control on current regimen. Continue current regimen. Continue to monitor. Call with any concerns. Refills given. Labs drawn today.      Relevant Medications   losartan (COZAAR) 100 MG tablet   simvastatin (ZOCOR) 40 MG tablet   tadalafil (CIALIS) 20 MG tablet   hydrochlorothiazide (HYDRODIURIL) 25 MG tablet   Other Relevant Orders   Comprehensive metabolic panel   Lipid Panel w/o Chol/HDL Ratio    Other Visit Diagnoses    Osteoarthritis, unspecified osteoarthritis type, unspecified site       Encouraged use of voltaren. Call if he'd like to go back to ortho.   Seasonal allergic rhinitis due to pollen       Restart nasal sprays. Call if not getting better or getting worse.        Follow up  plan: Return in about 4 weeks (around 10/03/2019) for follow up BP.

## 2019-09-06 LAB — COMPREHENSIVE METABOLIC PANEL
ALT: 17 IU/L (ref 0–44)
AST: 24 IU/L (ref 0–40)
Albumin/Globulin Ratio: 1.9 (ref 1.2–2.2)
Albumin: 3.6 g/dL — ABNORMAL LOW (ref 3.7–4.7)
Alkaline Phosphatase: 126 IU/L — ABNORMAL HIGH (ref 39–117)
BUN/Creatinine Ratio: 13 (ref 10–24)
BUN: 14 mg/dL (ref 8–27)
Bilirubin Total: 0.8 mg/dL (ref 0.0–1.2)
CO2: 25 mmol/L (ref 20–29)
Calcium: 8.3 mg/dL — ABNORMAL LOW (ref 8.6–10.2)
Chloride: 111 mmol/L — ABNORMAL HIGH (ref 96–106)
Creatinine, Ser: 1.06 mg/dL (ref 0.76–1.27)
GFR calc Af Amer: 77 mL/min/{1.73_m2} (ref >59)
GFR calc non Af Amer: 66 mL/min/{1.73_m2} (ref >59)
Globulin, Total: 1.9 g/dL (ref 1.5–4.5)
Glucose: 102 mg/dL — ABNORMAL HIGH (ref 65–99)
Potassium: 4.2 mmol/L (ref 3.5–5.2)
Sodium: 145 mmol/L — ABNORMAL HIGH (ref 134–144)
Total Protein: 5.5 g/dL — ABNORMAL LOW (ref 6.0–8.5)

## 2019-09-06 LAB — CBC WITH DIFFERENTIAL/PLATELET
Basophils Absolute: 0 10*3/uL (ref 0.0–0.2)
Basos: 1 %
EOS (ABSOLUTE): 0 10*3/uL (ref 0.0–0.4)
Eos: 0 %
Hematocrit: 28.8 % — ABNORMAL LOW (ref 37.5–51.0)
Hemoglobin: 9.4 g/dL — ABNORMAL LOW (ref 13.0–17.7)
Lymphocytes Absolute: 0.5 10*3/uL — ABNORMAL LOW (ref 0.7–3.1)
Lymphs: 20 %
MCH: 29.7 pg (ref 26.6–33.0)
MCHC: 32.6 g/dL (ref 31.5–35.7)
MCV: 91 fL (ref 79–97)
Monocytes Absolute: 0.1 10*3/uL (ref 0.1–0.9)
Monocytes: 5 %
NRBC: 4 % — ABNORMAL HIGH (ref 0–0)
Neutrophils Absolute: 1.6 10*3/uL (ref 1.4–7.0)
Neutrophils: 65 %
Platelets: 238 10*3/uL (ref 150–450)
RBC: 3.16 x10E6/uL — ABNORMAL LOW (ref 4.14–5.80)
RDW: 15.3 % (ref 11.6–15.4)
WBC: 2.5 10*3/uL — CL (ref 3.4–10.8)

## 2019-09-06 LAB — IMMATURE CELLS
Blasts/blast like cells: 3 % — ABNORMAL HIGH (ref 0–0)
Metamyelocytes: 6 % — ABNORMAL HIGH (ref 0–0)

## 2019-09-06 LAB — LIPID PANEL W/O CHOL/HDL RATIO
Cholesterol, Total: 131 mg/dL (ref 100–199)
HDL: 34 mg/dL — ABNORMAL LOW (ref >39)
LDL Chol Calc (NIH): 78 mg/dL (ref 0–99)
Triglycerides: 100 mg/dL (ref 0–149)
VLDL Cholesterol Cal: 19 mg/dL (ref 5–40)

## 2019-09-09 ENCOUNTER — Other Ambulatory Visit: Payer: Self-pay | Admitting: Family Medicine

## 2019-09-11 ENCOUNTER — Encounter: Payer: Self-pay | Admitting: Family Medicine

## 2019-09-25 DIAGNOSIS — Z961 Presence of intraocular lens: Secondary | ICD-10-CM | POA: Diagnosis not present

## 2019-09-26 ENCOUNTER — Telehealth: Payer: Self-pay | Admitting: Family Medicine

## 2019-09-26 NOTE — Chronic Care Management (AMB) (Signed)
  Chronic Care Management   Note  09/26/2019 Name: [name] MRN: [id number] DOB: 1941-01-25  [name] is a 79 y.o. year old male who is a primary care patient of [name], DO. I reached out to [employer] by phone today in response to a referral sent by [name]'s health plan.     [name] was given information about Chronic Care Management services today including:  1. CCM service includes personalized support from designated clinical staff supervised by his physician, including individualized plan of care and coordination with other care providers 2. 24/7 contact phone numbers for assistance for urgent and routine care needs. 3. Service will only be billed when office clinical staff spend 20 minutes or more in a month to coordinate care. 4. Only one practitioner may furnish and bill the service in a calendar month. 5. The patient may stop CCM services at any time (effective at the end of the month) by phone call to the office staff. 6. The patient will be responsible for cost sharing (co-pay) of up to 20% of the service fee (after annual deductible is met).  Patient did not agree to enrollment in care management services and does not wish to consider at this time.  Follow up plan: The patient has been provided with contact information for the care management team and has been advised to call with any health related questions or concerns.   [name], [place], [place], [place] [address] Direct Dial: [phone] [email or web address]: [email or web address]

## 2019-09-26 NOTE — Chronic Care Management (AMB) (Signed)
  Chronic Care Management   Outreach Note  09/26/2019 Name: [name] MRN: [id number] DOB: [birth year]  [name] is a 79 y.o. year old male who is a primary care patient of [name], DO. I reached out to [employer] by phone today in response to a referral sent by [name] health plan.     An unsuccessful telephone outreach was attempted today. The patient was referred to the case management team for assistance with care management and care coordination.   Follow Up Plan: The care management team will reach out to the patient again over the next 7 days.  If patient returns call to provider office, please advise to call Tice at [place], [place], [place], [place] [address] Direct Dial: [phone] [email or web address]

## 2019-09-30 ENCOUNTER — Ambulatory Visit (INDEPENDENT_AMBULATORY_CARE_PROVIDER_SITE_OTHER): Payer: Medicare Other | Admitting: Family Medicine

## 2019-09-30 ENCOUNTER — Other Ambulatory Visit: Payer: Self-pay

## 2019-09-30 ENCOUNTER — Encounter: Payer: Self-pay | Admitting: Family Medicine

## 2019-09-30 VITALS — BP 154/77 | HR 58 | Temp 98.7°F

## 2019-09-30 DIAGNOSIS — I129 Hypertensive chronic kidney disease with stage 1 through stage 4 chronic kidney disease, or unspecified chronic kidney disease: Secondary | ICD-10-CM

## 2019-09-30 NOTE — Progress Notes (Signed)
Here today for follow up on his BP after starting HCTZ. Did not start his HCTZ. Will start his HCTZ and have him follow up in 2-3 weeks

## 2019-09-30 NOTE — Progress Notes (Deleted)
There were no vitals taken for this visit.   Subjective:    Patient ID: [name], male    DOB: 30-Sep-1940, 79 y.o.   MRN: [id number]  HPI: [name] is a 79 y.o. male  No chief complaint on file.  HYPERTENSION Hypertension status: {Blank single:19197::"controlled","uncontrolled","better","worse","exacerbated","stable"}  Satisfied with current treatment? {Blank single:19197::"yes","no"} Duration of hypertension: {Blank single:19197::"chronic","months","years"} BP monitoring frequency:  {Blank single:19197::"not checking","rarely","daily","weekly","monthly","a few times a day","a few times a week","a few times a month"} BP range:  BP medication side effects:  {Blank single:19197::"yes","no"} Medication compliance: {Blank single:19197::"excellent compliance","good compliance","fair compliance","poor compliance"} Previous BP meds:{Blank [id number] (bystolic)","carvedilol","chlorthalidone","clonidine","diltiazem","exforge HCT","HCTZ","irbesartan (avapro)","labetalol","lisinopril","lisinopril-HCTZ","losartan (cozaar)","methyldopa","nifedipine","olmesartan (benicar)","olmesartan-HCTZ","quinapril","ramipril","spironalactone","tekturna","valsartan","valsartan-HCTZ","verapamil"} Aspirin: {Blank single:19197::"yes","no"} Recurrent headaches: {Blank single:19197::"yes","no"} Visual changes: {Blank single:19197::"yes","no"} Palpitations: {Blank single:19197::"yes","no"} Dyspnea: {Blank single:19197::"yes","no"} Chest pain: {Blank single:19197::"yes","no"} Lower extremity edema: {Blank single:19197::"yes","no"} Dizzy/lightheaded: {Blank single:19197::"yes","no"}  Relevant past medical, surgical, family and social history reviewed and updated as indicated. Interim medical history since our last visit reviewed. Allergies and medications reviewed and updated.  Review of Systems  Per HPI  unless specifically indicated above     Objective:    There were no vitals taken for this visit.  Wt Readings from Last 3 Encounters:  09/05/19 191 lb 9.6 oz (86.9 kg)  01/30/19 189 lb (85.7 kg)  01/14/19 189 lb 9.5 oz (86 kg)    Physical Exam  Results for orders placed or performed in visit on 09/05/19  CBC with Differential/Platelet  Result Value Ref Range   WBC 2.5 (LL) 3.4 - 10.8 x10E3/uL   RBC 3.16 (L) 4.14 - 5.80 x10E6/uL   Hemoglobin 9.4 (L) 13.0 - 17.7 g/dL   Hematocrit 28.8 (L) 37.5 - 51.0 %   MCV 91 79 - 97 fL   MCH 29.7 26.6 - 33.0 pg   MCHC 32.6 31.5 - 35.7 g/dL   RDW 15.3 11.6 - 15.4 %   Platelets 238 150 - 450 x10E3/uL   Neutrophils 65 Not Estab. %   Lymphs 20 Not Estab. %   Monocytes 5 Not Estab. %   Eos 0 Not Estab. %   Basos 1 Not Estab. %   Immature Cells Note    Neutrophils Absolute 1.6 1.4 - 7.0 x10E3/uL   Lymphocytes Absolute 0.5 (L) 0.7 - 3.1 x10E3/uL   Monocytes Absolute 0.1 0.1 - 0.9 x10E3/uL   EOS (ABSOLUTE) 0.0 0.0 - 0.4 x10E3/uL   Basophils Absolute 0.0 0.0 - 0.2 x10E3/uL   NRBC 4 (H) 0 - 0 %   Hematology Comments: Note:   Comprehensive metabolic panel  Result Value Ref Range   Glucose 102 (H) 65 - 99 mg/dL   BUN 14 8 - 27 mg/dL   Creatinine, Ser 1.06 0.76 - 1.27 mg/dL   GFR calc non Af Amer 66 >59 mL/min/1.73   GFR calc Af Amer 77 >59 mL/min/1.73   BUN/Creatinine Ratio 13 10 - 24   Sodium 145 (H) 134 - 144 mmol/L   Potassium 4.2 3.5 - 5.2 mmol/L   Chloride 111 (H) 96 - 106 mmol/L   CO2 25 20 - 29 mmol/L   Calcium 8.3 (L) 8.6 - 10.2 mg/dL   Total Protein 5.5 (L) 6.0 - 8.5 g/dL   Albumin 3.6 (L) 3.7 - 4.7 g/dL   Globulin, Total 1.9 1.5 - 4.5 g/dL   Albumin/Globulin Ratio 1.9 1.2 - 2.2   Bilirubin Total 0.8 0.0 - 1.2 mg/dL   Alkaline Phosphatase 126 (H) 39 - 117 IU/L   AST 24 0 - 40 IU/L   ALT  17 0 - 44 IU/L  Lipid Panel w/o Chol/HDL Ratio  Result Value Ref Range   Cholesterol, Total 131 100 - 199 mg/dL   Triglycerides 100 0 - 149  mg/dL   HDL 34 (L) >39 mg/dL   VLDL Cholesterol Cal 19 5 - 40 mg/dL   LDL Chol Calc (NIH) 78 0 - 99 mg/dL  Microalbumin, Urine Waived  Result Value Ref Range   Microalb, Ur Waived 150 (H) 0 - 19 mg/L   Creatinine, Urine Waived 100 10 - 300 mg/dL   Microalb/Creat Ratio >300 (H) <30 mg/g  Immature Cells  Result Value Ref Range   Metamyelocytes 6 (H) 0 - 0 %   Blasts/blast like cells 3 (H) 0 - 0 %      Assessment & Plan:   Problem List Items Addressed This Visit      Genitourinary   Benign hypertensive renal disease - Primary       Follow up plan: No follow-ups on file.

## 2019-10-04 ENCOUNTER — Encounter: Payer: Self-pay | Admitting: Family

## 2019-10-04 ENCOUNTER — Ambulatory Visit (INDEPENDENT_AMBULATORY_CARE_PROVIDER_SITE_OTHER): Payer: Medicare Other | Admitting: Family

## 2019-10-04 ENCOUNTER — Other Ambulatory Visit: Payer: Self-pay

## 2019-10-04 VITALS — BP 130/64 | HR 64 | Ht 71.0 in | Wt 183.0 lb

## 2019-10-04 DIAGNOSIS — I951 Orthostatic hypotension: Secondary | ICD-10-CM | POA: Diagnosis not present

## 2019-10-04 DIAGNOSIS — I10 Essential (primary) hypertension: Secondary | ICD-10-CM | POA: Diagnosis not present

## 2019-10-04 DIAGNOSIS — E782 Mixed hyperlipidemia: Secondary | ICD-10-CM

## 2019-10-04 DIAGNOSIS — I639 Cerebral infarction, unspecified: Secondary | ICD-10-CM | POA: Diagnosis not present

## 2019-10-04 NOTE — Patient Instructions (Signed)
Medication Instructions:  No medication changes today. Your blood pressure looks better on the new medication Dr. Wynetta Emery started!  *If you need a refill on your cardiac medications before your next appointment, please call your pharmacy*  Lab Work: No lab work today.   Testing/Procedures: Your EKG today shows normal sinus rhythm which is a great result!  Follow-Up: At [facility], you and your health needs are our priority.  As part of our continuing mission to provide you with exceptional heart care, we have created designated Provider Care Teams.  These Care Teams include your primary Cardiologist (physician) and Advanced Practice Providers (APPs -  Physician Assistants and Nurse Practitioners) who all work together to provide you with the care you need, when you need it.  We recommend signing up for the patient portal called "MyChart".  Sign up information is provided on this After Visit Summary.  MyChart is used to connect with patients for Virtual Visits (Telemedicine).  Patients are able to view lab/test results, encounter notes, upcoming appointments, etc.  Non-urgent messages can be sent to your provider as well.   To learn more about what you can do with MyChart, go to [email or web address].    Your next appointment:   1 year(s)  The format for your next appointment:   In Person  Provider:   You may see Ida Rogue, MD or one of the following Advanced Practice Providers on your designated Care Team:    Murray Hodgkins, NP  Christell Faith, PA-C  Marrianne Mood, PA-C  Other Instructions  Recommend having your wife look at the spot on your back so she knows what it looks like. Laurann Montana, NP will send Dr. Wynetta Emery a note about it. It looks like a small cyst right between your shoulder blades. It does not infected or red today on exam. Would recommend calling your dermatologist to make an appointment.

## 2019-10-04 NOTE — Progress Notes (Signed)
Office Visit    Patient Name: [name] Date of Encounter: 10/04/2019  Primary Care Provider:  [name], DO Primary Cardiologist:  Ida Rogue, MD Electrophysiologist:  None   Chief Complaint    [name] is a 79 y.o. male with a hx of HTN, CVA, myelofibrosis diagnosed 2016  presents today for follow up of HTN.    Past Medical History    Past Medical History:  Diagnosis Date  . Arthritis   . Benign hypertensive renal disease   . GERD (gastroesophageal reflux disease)   . Heartburn   . History of retinal detachment   . Hyperlipidemia   . Hypertension   . 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 Myelofibrosis ([place])   . Prostate hypertrophy   . Stroke [facility]) June 2009   R brain subcortical infarct   Past Surgical History:  Procedure Laterality Date  . ASPIRATION / INJECTION RENAL CYST  Feb 2017  . BACK SURGERY     approx 20- 25 years ago  . GAS INSERTION  08/11/2011   Procedure: INSERTION OF GAS;  Surgeon: Hayden Pedro, MD;  Location: [facility];  Service: Ophthalmology;  Laterality: Right;  C3F8  . SCLERAL BUCKLE  08/11/2011   Procedure: SCLERAL BUCKLE;  Surgeon: Hayden Pedro, MD;  Location: [facility];  Service: Ophthalmology;  Laterality: Right;  Marland Kitchen VARICOSE VEIN SURGERY      Allergies  Allergies  Allergen Reactions  . Meloxicam Nausea And Vomiting    History of Present Illness    [name] is a 79 y.o. male with a hx of HTN, CVA, myelofibrosis diagnosed 2016, orthostatic hypotension, anemia last seen 10/03/18 by Dr. Rockey Situ.  Reports feeling overall well. Recently seen by his PCP who added HCTZ to his medication regimen. He did not initially start, but has been taking the last few days. Not monitoring his BP at home.   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 No formal exercise regimen. Does not add salt to his food.   Reports no shortness of breath nor dyspnea on  exertion. Reports no chest pain, pressure, or tightness. No edema, orthopnea, PND. Reports no palpitations.   EKGs/Labs/Other Studies Reviewed:   The following studies were reviewed today:  EKG:  EKG is ordered today.  The ekg ordered today demonstrates SR 64 bpm  Recent Labs: 07/16/2019: TSH 3.650 09/05/2019: ALT 17; BUN 14; Creatinine, Ser 1.06; Hemoglobin 9.4; Platelets 238; Potassium 4.2; Sodium 145  Recent Lipid Panel    Component Value Date/Time   CHOL 131 09/05/2019 1452   CHOL 106 04/14/2016 0858   TRIG 100 09/05/2019 1452   TRIG 99 04/14/2016 0858   HDL 34 (L) 09/05/2019 1452   CHOLHDL 2.8 12/10/2012 1522   CHOLHDL 6.4 11/21/2007 0325   VLDL 20 04/14/2016 0858   LDLCALC 78 09/05/2019 1452    Home Medications   Current Meds  Medication Sig  . azelastine (ASTELIN) 0.1 % nasal spray Place 1 spray into both nostrils 2 (two) times daily. Use in each nostril as directed  . calcium carbonate (OSCAL) 1500 (600 Ca) MG TABS tablet Take by mouth.  . clopidogrel (PLAVIX) 75 MG tablet Take 1 tablet (75 mg total) by mouth daily.  . diclofenac sodium (VOLTAREN) 1 % GEL Apply 4 g topically 4 (four) times daily as needed (for neck pain).  Alben Deeds [facility]  [facility]) 500 MG CAPS Take 500 mg by mouth daily.  . hydrochlorothiazide (HYDRODIURIL) 25 MG tablet Take 1 tablet (25 mg total) by mouth daily.  Marland Kitchen losartan (COZAAR) 100 MG tablet Take 1 tablet (100 mg total) by mouth daily.  . meclizine (ANTIVERT) 25 MG tablet TAKE 1 TABLET BY MOUTH 3 TIMES DAILY AS NEEDED FOR DIZZINESS  . Multiple Vitamin (MULITIVITAMIN WITH MINERALS) TABS Take 1 tablet by mouth daily.  . Naproxen Sodium (ALEVE) 220 MG CAPS Take by mouth as needed.   Marland Kitchen omeprazole (PRILOSEC) 40 MG capsule Take 1 capsule (40 mg total) by mouth daily.  . simvastatin (ZOCOR) 40 MG tablet TAKE 1 TABLET BY MOUTH DAILY  . tadalafil (CIALIS) 20 MG tablet Take 0.5-1 tablets (10-20 mg total) by mouth every other day  as needed for erectile dysfunction.  . tamsulosin (FLOMAX) 0.4 MG CAPS capsule Take 1 capsule (0.4 mg total) by mouth daily.  Marland Kitchen zinc gluconate 50 MG tablet Take by mouth.      Review of Systems      Review of Systems  Constitution: Negative for chills, fever and malaise/fatigue.  Cardiovascular: Negative for chest pain, dyspnea on exertion, leg swelling, near-syncope, orthopnea, palpitations and syncope.  Respiratory: Negative for cough, shortness of breath and wheezing.   Gastrointestinal: Negative for nausea and vomiting.  Neurological: Negative for dizziness, light-headedness and weakness.   All other systems reviewed and are otherwise negative except as noted above.  Physical Exam    VS:  BP 130/64 (BP Location: Left Arm, Patient Position: Sitting, Cuff Size: Normal)   Pulse 64   Ht 5\' 11"  (1.803 m)   Wt 183 lb (83 kg)   SpO2 95%   BMI 25.52 kg/m  , BMI Body mass index is 25.52 kg/m. GEN: Well nourished, well developed, in no acute distress. HEENT: normal. Neck: Supple, no JVD, carotid bruits, or masses. Cardiac: RRR, no murmurs, rubs, or gallops. No clubbing, cyanosis, edema.  Radials/DP/PT 2+ and equal bilaterally.  Respiratory:  Respirations regular and unlabored, clear to auscultation bilaterally. GI: Soft, nontender, nondistended, BS + x 4. MS: No deformity or atrophy. Skin: Warm and dry, no rash. 1.5" diameter cyst noted to back between scapula. No erythema, redness, signs of infection. Neuro:  Strength and sensation are intact. Psych: Normal affect.  Assessment & Plan    1. HTN - BP well controlled today. Addition of HCTZ by Dr. Wynetta Emery with marked improvement in BP. Continue Losartan 100mg  daily, HCTZ 25mg  daily.  2. HLD - Continue Simvastatin.   3. Cyst - Cyst noted to back between shoulder blades. 1.5" in diameter, no signs of infection, nontender. He will call his dermatologist. Tells me he has had cyst on his back in the past that had to be drained. He will  also have his wife look at it as he cannot see it so they can watch for any changes.   4. Hx of CVA - No recurrent stroke like symptoms. Continue Plavix and Simvastatin.   5. Anemia - Follows with GI.   Disposition: Follow up in 1 year(s) with Dr. Rockey Situ.    Loel Dubonnet, NP 10/04/2019, 10:08 PM

## 2019-10-15 ENCOUNTER — Ambulatory Visit: Payer: Medicare Other | Admitting: Family Medicine

## 2019-10-18 ENCOUNTER — Encounter: Payer: Self-pay | Admitting: Family Medicine

## 2019-10-18 ENCOUNTER — Ambulatory Visit (INDEPENDENT_AMBULATORY_CARE_PROVIDER_SITE_OTHER): Payer: Medicare Other | Admitting: Family Medicine

## 2019-10-18 VITALS — BP 144/70 | HR 66 | Temp 97.7°F | Wt 188.0 lb

## 2019-10-18 DIAGNOSIS — R454 Irritability and anger: Secondary | ICD-10-CM | POA: Diagnosis not present

## 2019-10-18 DIAGNOSIS — I639 Cerebral infarction, unspecified: Secondary | ICD-10-CM

## 2019-10-18 MED ORDER — DULOXETINE HCL 20 MG PO CPEP: 20.0000 mg | ORAL_CAPSULE | Freq: Every day | ORAL | 3 refills | Status: DC

## 2019-10-18 NOTE — Patient Instructions (Signed)
[facility] [address] [place], [place] [address] Phone: [phone]  [facility] [address] [place], [place], [place] [address] Phone: [phone]  [facility]  [address], [place], [place] [address] Phone: [phone]  [facility] [place], [place], [place] [address] Phone: [phone]  [facility] [address] [place], [place] [address] Phone: [phone]  [facility] [address] [place], [place] [phone]  [facility] [address], [place]  [facility] [address]  [phone]

## 2019-10-18 NOTE — Assessment & Plan Note (Signed)
Would like to try cymbalta to help with irritability. Will start low dose. Recheck 1 month. Call with any concerns. Continue to monitor.

## 2019-10-18 NOTE — Progress Notes (Signed)
BP (!) 144/70   Pulse 66   Temp 97.7 F (36.5 C)   Wt 188 lb (85.3 kg)   SpO2 97%   BMI 26.22 kg/m    Subjective:    Patient ID: [name], male    DOB: 11/24/1940, 79 y.o.   MRN: [id number]  HPI: [name] is a 79 y.o. male  Chief Complaint  Patient presents with  . Depression   DEPRESSION- has been very irritable and easily upset. His wife is on cymbalta and he thinks it could help him. He'd like to try it. Mood status: uncontrolled Satisfied with current treatment?: no Symptom severity: mild  Duration of current treatment : not on anything Psychotherapy/counseling: no  Previous psychiatric medications: none Depressed mood: no Anxious mood: yes Anhedonia: no Significant weight loss or gain: no Insomnia: no  Fatigue: yes Feelings of worthlessness or guilt: no Impaired concentration/indecisiveness: no Suicidal ideations: no Hopelessness: no Crying spells: no Depression screen [facility] 2/9 10/18/2019 01/30/2019 01/24/2018 01/18/2017 04/14/2016  Decreased Interest 0 0 0 0 0  Down, Depressed, Hopeless 0 0 0 0 0  PHQ - 2 Score 0 0 0 0 0  Altered sleeping 0 - - - -  Tired, decreased energy 1 - - - -  Change in appetite 0 - - - -  Feeling bad or failure about yourself  0 - - - -  Trouble concentrating 0 - - - -  Moving slowly or fidgety/restless 0 - - - -  Suicidal thoughts 0 - - - -  PHQ-9 Score 1 - - - -  Difficult doing work/chores Somewhat difficult - - - -    Relevant past medical, surgical, family and social history reviewed and updated as indicated. Interim medical history since our last visit reviewed. Allergies and medications reviewed and updated.  Review of Systems  Constitutional: Negative.   Respiratory: Negative.   Cardiovascular: Negative.   Musculoskeletal: Negative.   Skin: Negative.   Neurological: Negative.   Psychiatric/Behavioral: Positive for agitation and behavioral problems. Negative for confusion, decreased concentration, dysphoric  mood, hallucinations, self-injury, sleep disturbance and suicidal ideas. The patient is nervous/anxious. The patient is not hyperactive.     Per HPI unless specifically indicated above     Objective:    BP (!) 144/70   Pulse 66   Temp 97.7 F (36.5 C)   Wt 188 lb (85.3 kg)   SpO2 97%   BMI 26.22 kg/m   Wt Readings from Last 3 Encounters:  10/18/19 188 lb (85.3 kg)  10/04/19 183 lb (83 kg)  09/05/19 191 lb 9.6 oz (86.9 kg)    Physical Exam Vitals and nursing note reviewed.  Constitutional:      General: He is not in acute distress.    Appearance: Normal appearance. He is not ill-appearing, toxic-appearing or diaphoretic.  HENT:     Head: Normocephalic and atraumatic.     Right Ear: External ear normal.     Left Ear: External ear normal.     Nose: Nose normal.     Mouth/Throat:     Mouth: Mucous membranes are moist.     Pharynx: Oropharynx is clear.  Eyes:     General: No scleral icterus.       Right eye: No discharge.        Left eye: No discharge.     Extraocular Movements: Extraocular movements intact.     Conjunctiva/sclera: Conjunctivae normal.     Pupils: Pupils are equal, round,  and reactive to light.  Cardiovascular:     Rate and Rhythm: Normal rate and regular rhythm.     Pulses: Normal pulses.     Heart sounds: Normal heart sounds. No murmur. No friction rub. No gallop.   Pulmonary:     Effort: Pulmonary effort is normal. No respiratory distress.     Breath sounds: Normal breath sounds. No stridor. No wheezing, rhonchi or rales.  Chest:     Chest wall: No tenderness.  Musculoskeletal:        General: Normal range of motion.     Cervical back: Normal range of motion and neck supple.  Skin:    General: Skin is warm and dry.     Capillary Refill: Capillary refill takes less than 2 seconds.     Coloration: Skin is not jaundiced or pale.     Findings: No bruising, erythema, lesion or rash.  Neurological:     General: No focal deficit present.     Mental  Status: He is alert and oriented to person, place, and time. Mental status is at baseline.  Psychiatric:        Mood and Affect: Mood normal.        Behavior: Behavior normal.        Thought Content: Thought content normal.        Judgment: Judgment normal.     Results for orders placed or performed in visit on 09/05/19  CBC with Differential/Platelet  Result Value Ref Range   WBC 2.5 (LL) 3.4 - 10.8 x10E3/uL   RBC 3.16 (L) 4.14 - 5.80 x10E6/uL   Hemoglobin 9.4 (L) 13.0 - 17.7 g/dL   Hematocrit 28.8 (L) 37.5 - 51.0 %   MCV 91 79 - 97 fL   MCH 29.7 26.6 - 33.0 pg   MCHC 32.6 31.5 - 35.7 g/dL   RDW 15.3 11.6 - 15.4 %   Platelets 238 150 - 450 x10E3/uL   Neutrophils 65 Not Estab. %   Lymphs 20 Not Estab. %   Monocytes 5 Not Estab. %   Eos 0 Not Estab. %   Basos 1 Not Estab. %   Immature Cells Note    Neutrophils Absolute 1.6 1.4 - 7.0 x10E3/uL   Lymphocytes Absolute 0.5 (L) 0.7 - 3.1 x10E3/uL   Monocytes Absolute 0.1 0.1 - 0.9 x10E3/uL   EOS (ABSOLUTE) 0.0 0.0 - 0.4 x10E3/uL   Basophils Absolute 0.0 0.0 - 0.2 x10E3/uL   NRBC 4 (H) 0 - 0 %   Hematology Comments: Note:   Comprehensive metabolic panel  Result Value Ref Range   Glucose 102 (H) 65 - 99 mg/dL   BUN 14 8 - 27 mg/dL   Creatinine, Ser 1.06 0.76 - 1.27 mg/dL   GFR calc non Af Amer 66 >59 mL/min/1.73   GFR calc Af Amer 77 >59 mL/min/1.73   BUN/Creatinine Ratio 13 10 - 24   Sodium 145 (H) 134 - 144 mmol/L   Potassium 4.2 3.5 - 5.2 mmol/L   Chloride 111 (H) 96 - 106 mmol/L   CO2 25 20 - 29 mmol/L   Calcium 8.3 (L) 8.6 - 10.2 mg/dL   Total Protein 5.5 (L) 6.0 - 8.5 g/dL   Albumin 3.6 (L) 3.7 - 4.7 g/dL   Globulin, Total 1.9 1.5 - 4.5 g/dL   Albumin/Globulin Ratio 1.9 1.2 - 2.2   Bilirubin Total 0.8 0.0 - 1.2 mg/dL   Alkaline Phosphatase 126 (H) 39 - 117 IU/L   AST 24 0 -  40 IU/L   ALT 17 0 - 44 IU/L  Lipid Panel w/o Chol/HDL Ratio  Result Value Ref Range   Cholesterol, Total 131 100 - 199 mg/dL    Triglycerides 100 0 - 149 mg/dL   HDL 34 (L) >39 mg/dL   VLDL Cholesterol Cal 19 5 - 40 mg/dL   LDL Chol Calc (NIH) 78 0 - 99 mg/dL  Microalbumin, Urine Waived  Result Value Ref Range   Microalb, Ur Waived 150 (H) 0 - 19 mg/L   Creatinine, Urine Waived 100 10 - 300 mg/dL   Microalb/Creat Ratio >300 (H) <30 mg/g  Immature Cells  Result Value Ref Range   Metamyelocytes 6 (H) 0 - 0 %   Blasts/blast like cells 3 (H) 0 - 0 %      Assessment & Plan:   Problem List Items Addressed This Visit      Other   Irritability - Primary    Would like to try cymbalta to help with irritability. Will start low dose. Recheck 1 month. Call with any concerns. Continue to monitor.           Follow up plan: Return in about 4 weeks (around 11/15/2019).

## 2019-10-31 ENCOUNTER — Other Ambulatory Visit: Payer: Self-pay

## 2019-10-31 ENCOUNTER — Ambulatory Visit (INDEPENDENT_AMBULATORY_CARE_PROVIDER_SITE_OTHER): Payer: Medicare Other | Admitting: Family Medicine

## 2019-10-31 ENCOUNTER — Encounter: Payer: Self-pay | Admitting: Family Medicine

## 2019-10-31 VITALS — BP 112/64 | HR 60 | Temp 98.1°F | Ht 67.72 in | Wt 182.2 lb

## 2019-10-31 DIAGNOSIS — I129 Hypertensive chronic kidney disease with stage 1 through stage 4 chronic kidney disease, or unspecified chronic kidney disease: Secondary | ICD-10-CM

## 2019-10-31 DIAGNOSIS — I639 Cerebral infarction, unspecified: Secondary | ICD-10-CM

## 2019-10-31 MED ORDER — LOSARTAN POTASSIUM-HCTZ 100-25 MG PO TABS: 1.0000 | ORAL_TABLET | Freq: Every day | ORAL | 1 refills | Status: DC

## 2019-10-31 NOTE — Assessment & Plan Note (Signed)
Doing much better on current regimen. Put in combo pill. Labs drawn today. Call with any concerns. Continue to monitor.

## 2019-10-31 NOTE — Patient Instructions (Signed)
[facility] [address] [place], [place] [address] Phone: [phone]  [facility] [address] [place], [place], [place] [address] Phone: [phone]  [facility]  [address], [place], [place] [address] Phone: [phone]  [facility] [place], [place], [place] [address] Phone: [phone]  [facility] [address] [place],  [address] Phone: [phone]  [facility] [address] [place], [place] [phone]  [facility] [address] [place]  [facility] [address]  [phone]

## 2019-10-31 NOTE — Progress Notes (Signed)
BP 112/64 (BP Location: Right Arm, Patient Position: Sitting, Cuff Size: Normal)   Pulse 60   Temp 98.1 F (36.7 C) (Oral)   Ht 5' 7.72" (1.72 m)   Wt 182 lb 3.2 oz (82.6 kg)   SpO2 95%   BMI 27.94 kg/m    Subjective:    Patient ID: [name], male    DOB: 15-Jan-1941, 79 y.o.   MRN: [id number]  HPI: [name] is a 79 y.o. male  Chief Complaint  Patient presents with  . Hypertension   HYPERTENSION Hypertension status: controlled  Satisfied with current treatment? yes Duration of hypertension: chronic BP monitoring frequency:  not checking BP medication side effects:  no Medication compliance: excellent compliance Previous BP meds:losartan, HCTZ Aspirin: yes Recurrent headaches: no Visual changes: no Palpitations: no Dyspnea: no Chest pain: no Lower extremity edema: no Dizzy/lightheaded: no   Relevant past medical, surgical, family and social history reviewed and updated as indicated. Interim medical history since our last visit reviewed. Allergies and medications reviewed and updated.  Review of Systems  Constitutional: Negative.   HENT: Positive for hearing loss. Negative for congestion, dental problem, drooling, ear discharge, ear pain, facial swelling, mouth sores, nosebleeds, postnasal drip, rhinorrhea, sinus pressure, sinus pain, sneezing, sore throat, tinnitus, trouble swallowing and voice change.   Respiratory: Negative.   Cardiovascular: Negative.   Gastrointestinal: Negative.   Musculoskeletal: Negative.   Psychiatric/Behavioral: Negative.     Per HPI unless specifically indicated above     Objective:    BP 112/64 (BP Location: Right Arm, Patient Position: Sitting, Cuff Size: Normal)   Pulse 60   Temp 98.1 F (36.7 C) (Oral)   Ht 5' 7.72" (1.72 m)   Wt 182 lb 3.2 oz (82.6 kg)   SpO2 95%   BMI 27.94 kg/m   Wt Readings from Last 3 Encounters:  10/31/19 182 lb 3.2 oz (82.6 kg)  10/18/19 188 lb (85.3 kg)  10/04/19 183 lb (83 kg)      Physical Exam Vitals and nursing note reviewed.  Constitutional:      General: He is not in acute distress.    Appearance: Normal appearance. He is not ill-appearing, toxic-appearing or diaphoretic.  HENT:     Head: Normocephalic and atraumatic.     Right Ear: External ear normal.     Left Ear: External ear normal.     Nose: Nose normal.     Mouth/Throat:     Mouth: Mucous membranes are moist.     Pharynx: Oropharynx is clear.  Eyes:     General: No scleral icterus.       Right eye: No discharge.        Left eye: No discharge.     Extraocular Movements: Extraocular movements intact.     Conjunctiva/sclera: Conjunctivae normal.     Pupils: Pupils are equal, round, and reactive to light.  Cardiovascular:     Rate and Rhythm: Normal rate and regular rhythm.     Pulses: Normal pulses.     Heart sounds: Normal heart sounds. No murmur. No friction rub. No gallop.   Pulmonary:     Effort: Pulmonary effort is normal. No respiratory distress.     Breath sounds: Normal breath sounds. No stridor. No wheezing, rhonchi or rales.  Chest:     Chest wall: No tenderness.  Musculoskeletal:        General: Normal range of motion.     Cervical back: Normal range of motion and neck  supple.  Skin:    General: Skin is warm and dry.     Capillary Refill: Capillary refill takes less than 2 seconds.     Coloration: Skin is not jaundiced or pale.     Findings: No bruising, erythema, lesion or rash.  Neurological:     General: No focal deficit present.     Mental Status: He is alert and oriented to person, place, and time. Mental status is at baseline.  Psychiatric:        Mood and Affect: Mood normal.        Behavior: Behavior normal.        Thought Content: Thought content normal.        Judgment: Judgment normal.     Results for orders placed or performed in visit on 09/05/19  CBC with Differential/Platelet  Result Value Ref Range   WBC 2.5 (LL) 3.4 - 10.8 x10E3/uL   RBC 3.16 (L) 4.14 -  5.80 x10E6/uL   Hemoglobin 9.4 (L) 13.0 - 17.7 g/dL   Hematocrit 28.8 (L) 37.5 - 51.0 %   MCV 91 79 - 97 fL   MCH 29.7 26.6 - 33.0 pg   MCHC 32.6 31.5 - 35.7 g/dL   RDW 15.3 11.6 - 15.4 %   Platelets 238 150 - 450 x10E3/uL   Neutrophils 65 Not Estab. %   Lymphs 20 Not Estab. %   Monocytes 5 Not Estab. %   Eos 0 Not Estab. %   Basos 1 Not Estab. %   Immature Cells Note    Neutrophils Absolute 1.6 1.4 - 7.0 x10E3/uL   Lymphocytes Absolute 0.5 (L) 0.7 - 3.1 x10E3/uL   Monocytes Absolute 0.1 0.1 - 0.9 x10E3/uL   EOS (ABSOLUTE) 0.0 0.0 - 0.4 x10E3/uL   Basophils Absolute 0.0 0.0 - 0.2 x10E3/uL   NRBC 4 (H) 0 - 0 %   Hematology Comments: Note:   Comprehensive metabolic panel  Result Value Ref Range   Glucose 102 (H) 65 - 99 mg/dL   BUN 14 8 - 27 mg/dL   Creatinine, Ser 1.06 0.76 - 1.27 mg/dL   GFR calc non Af Amer 66 >59 mL/min/1.73   GFR calc Af Amer 77 >59 mL/min/1.73   BUN/Creatinine Ratio 13 10 - 24   Sodium 145 (H) 134 - 144 mmol/L   Potassium 4.2 3.5 - 5.2 mmol/L   Chloride 111 (H) 96 - 106 mmol/L   CO2 25 20 - 29 mmol/L   Calcium 8.3 (L) 8.6 - 10.2 mg/dL   Total Protein 5.5 (L) 6.0 - 8.5 g/dL   Albumin 3.6 (L) 3.7 - 4.7 g/dL   Globulin, Total 1.9 1.5 - 4.5 g/dL   Albumin/Globulin Ratio 1.9 1.2 - 2.2   Bilirubin Total 0.8 0.0 - 1.2 mg/dL   Alkaline Phosphatase 126 (H) 39 - 117 IU/L   AST 24 0 - 40 IU/L   ALT 17 0 - 44 IU/L  Lipid Panel w/o Chol/HDL Ratio  Result Value Ref Range   Cholesterol, Total 131 100 - 199 mg/dL   Triglycerides 100 0 - 149 mg/dL   HDL 34 (L) >39 mg/dL   VLDL Cholesterol Cal 19 5 - 40 mg/dL   LDL Chol Calc (NIH) 78 0 - 99 mg/dL  Microalbumin, Urine Waived  Result Value Ref Range   Microalb, Ur Waived 150 (H) 0 - 19 mg/L   Creatinine, Urine Waived 100 10 - 300 mg/dL   Microalb/Creat Ratio >300 (H) <30 mg/g  Immature Cells  Result Value Ref Range   Metamyelocytes 6 (H) 0 - 0 %   Blasts/blast like cells 3 (H) 0 - 0 %      Assessment &  Plan:   Problem List Items Addressed This Visit      Genitourinary   Benign hypertensive renal disease - Primary    Doing much better on current regimen. Put in combo pill. Labs drawn today. Call with any concerns. Continue to monitor.      Relevant Orders   Basic metabolic panel       Follow up plan: Return in about 4 weeks (around 11/28/2019).

## 2019-11-01 ENCOUNTER — Other Ambulatory Visit: Payer: Self-pay | Admitting: Family Medicine

## 2019-11-01 DIAGNOSIS — N289 Disorder of kidney and ureter, unspecified: Secondary | ICD-10-CM

## 2019-11-01 LAB — BASIC METABOLIC PANEL
BUN/Creatinine Ratio: 25 — ABNORMAL HIGH (ref 10–24)
BUN: 35 mg/dL — ABNORMAL HIGH (ref 8–27)
CO2: 24 mmol/L (ref 20–29)
Calcium: 8.5 mg/dL — ABNORMAL LOW (ref 8.6–10.2)
Chloride: 110 mmol/L — ABNORMAL HIGH (ref 96–106)
Creatinine, Ser: 1.4 mg/dL — ABNORMAL HIGH (ref 0.76–1.27)
GFR calc Af Amer: 55 mL/min/{1.73_m2} — ABNORMAL LOW (ref >59)
GFR calc non Af Amer: 47 mL/min/{1.73_m2} — ABNORMAL LOW (ref >59)
Glucose: 95 mg/dL (ref 65–99)
Potassium: 4.5 mmol/L (ref 3.5–5.2)
Sodium: 146 mmol/L — ABNORMAL HIGH (ref 134–144)

## 2019-11-05 ENCOUNTER — Other Ambulatory Visit: Payer: Medicare Other

## 2019-11-05 ENCOUNTER — Other Ambulatory Visit: Payer: Self-pay

## 2019-11-05 DIAGNOSIS — N289 Disorder of kidney and ureter, unspecified: Secondary | ICD-10-CM

## 2019-11-06 LAB — BASIC METABOLIC PANEL
BUN/Creatinine Ratio: 22 (ref 10–24)
BUN: 26 mg/dL (ref 8–27)
CO2: 22 mmol/L (ref 20–29)
Calcium: 8.8 mg/dL (ref 8.6–10.2)
Chloride: 107 mmol/L — ABNORMAL HIGH (ref 96–106)
Creatinine, Ser: 1.16 mg/dL (ref 0.76–1.27)
GFR calc Af Amer: 69 mL/min/{1.73_m2} (ref >59)
GFR calc non Af Amer: 60 mL/min/{1.73_m2} (ref >59)
Glucose: 97 mg/dL (ref 65–99)
Potassium: 4.6 mmol/L (ref 3.5–5.2)
Sodium: 143 mmol/L (ref 134–144)

## 2019-11-13 ENCOUNTER — Other Ambulatory Visit: Payer: Self-pay

## 2019-11-26 ENCOUNTER — Ambulatory Visit (INDEPENDENT_AMBULATORY_CARE_PROVIDER_SITE_OTHER): Payer: Medicare Other | Admitting: Family Medicine

## 2019-11-26 ENCOUNTER — Other Ambulatory Visit: Payer: Self-pay

## 2019-11-26 ENCOUNTER — Encounter: Payer: Self-pay | Admitting: Family Medicine

## 2019-11-26 VITALS — BP 128/61 | HR 58 | Temp 98.1°F | Wt 186.0 lb

## 2019-11-26 DIAGNOSIS — I639 Cerebral infarction, unspecified: Secondary | ICD-10-CM

## 2019-11-26 DIAGNOSIS — Z1283 Encounter for screening for malignant neoplasm of skin: Secondary | ICD-10-CM

## 2019-11-26 DIAGNOSIS — R454 Irritability and anger: Secondary | ICD-10-CM

## 2019-11-26 DIAGNOSIS — I129 Hypertensive chronic kidney disease with stage 1 through stage 4 chronic kidney disease, or unspecified chronic kidney disease: Secondary | ICD-10-CM | POA: Diagnosis not present

## 2019-11-26 MED ORDER — DULOXETINE HCL 20 MG PO CPEP: 20.0000 mg | ORAL_CAPSULE | Freq: Every day | ORAL | 1 refills | Status: DC

## 2019-11-26 NOTE — Progress Notes (Signed)
BP 128/61 (BP Location: Left Arm, Patient Position: Sitting, Cuff Size: Normal)   Pulse (!) 58   Temp 98.1 F (36.7 C) (Oral)   Wt 186 lb (84.4 kg)   SpO2 99%   BMI 28.52 kg/m    Subjective:    Patient ID: [name], male    DOB: 1940/09/02, 79 y.o.   MRN: [id number]  HPI: [name] is a 79 y.o. male  Chief Complaint  Patient presents with  . Hypertension  . irritability   HYPERTENSION Hypertension status: controlled  Satisfied with current treatment? yes Duration of hypertension: chronic BP monitoring frequency:  not checking BP medication side effects:  no Medication compliance: excellent compliance Previous BP meds: losartan-HCTZ Aspirin: no Recurrent headaches: no Visual changes: no Palpitations: no Dyspnea: no Chest pain: no Lower extremity edema: no Dizzy/lightheaded: no  DEPRESSION Mood status: better Satisfied with current treatment?: yes Symptom severity: mild  Duration of current treatment : 1 month Side effects: no Medication compliance: excellent compliance Psychotherapy/counseling: no  Previous psychiatric medications: cymbalta Depressed mood: yes Anxious mood: no Anhedonia: no Significant weight loss or gain: no Insomnia: no  Fatigue: no Feelings of worthlessness or guilt: no Impaired concentration/indecisiveness: no Suicidal ideations: no Hopelessness: no Crying spells: no Depression screen [facility] 2/9 10/18/2019 01/30/2019 01/24/2018 01/18/2017 04/14/2016  Decreased Interest 0 0 0 0 0  Down, Depressed, Hopeless 0 0 0 0 0  PHQ - 2 Score 0 0 0 0 0  Altered sleeping 0 - - - -  Tired, decreased energy 1 - - - -  Change in appetite 0 - - - -  Feeling bad or failure about yourself  0 - - - -  Trouble concentrating 0 - - - -  Moving slowly or fidgety/restless 0 - - - -  Suicidal thoughts 0 - - - -  PHQ-9 Score 1 - - - -  Difficult doing work/chores Somewhat difficult - - - -     Relevant past medical, surgical, family and social  history reviewed and updated as indicated. Interim medical history since our last visit reviewed. Allergies and medications reviewed and updated.  Review of Systems  Constitutional: Negative.   Respiratory: Negative.   Cardiovascular: Negative.   Gastrointestinal: Negative.   Musculoskeletal: Negative.   Neurological: Negative.   Psychiatric/Behavioral: Negative.     Per HPI unless specifically indicated above     Objective:    BP 128/61 (BP Location: Left Arm, Patient Position: Sitting, Cuff Size: Normal)   Pulse (!) 58   Temp 98.1 F (36.7 C) (Oral)   Wt 186 lb (84.4 kg)   SpO2 99%   BMI 28.52 kg/m   Wt Readings from Last 3 Encounters:  11/26/19 186 lb (84.4 kg)  10/31/19 182 lb 3.2 oz (82.6 kg)  10/18/19 188 lb (85.3 kg)    Physical Exam Vitals and nursing note reviewed.  Constitutional:      General: He is not in acute distress.    Appearance: Normal appearance. He is not ill-appearing, toxic-appearing or diaphoretic.  HENT:     Head: Normocephalic and atraumatic.     Right Ear: External ear normal.     Left Ear: External ear normal.     Nose: Nose normal.     Mouth/Throat:     Mouth: Mucous membranes are moist.     Pharynx: Oropharynx is clear.  Eyes:     General: No scleral icterus.       Right eye: No discharge.  Left eye: No discharge.     Extraocular Movements: Extraocular movements intact.     Conjunctiva/sclera: Conjunctivae normal.     Pupils: Pupils are equal, round, and reactive to light.  Cardiovascular:     Rate and Rhythm: Normal rate and regular rhythm.     Pulses: Normal pulses.     Heart sounds: Normal heart sounds. No murmur heard.  No friction rub. No gallop.   Pulmonary:     Effort: Pulmonary effort is normal. No respiratory distress.     Breath sounds: Normal breath sounds. No stridor. No wheezing, rhonchi or rales.  Chest:     Chest wall: No tenderness.  Musculoskeletal:        General: Normal range of motion.     Cervical  back: Normal range of motion and neck supple.  Skin:    General: Skin is warm and dry.     Capillary Refill: Capillary refill takes less than 2 seconds.     Coloration: Skin is not jaundiced or pale.     Findings: No bruising, erythema, lesion or rash.  Neurological:     General: No focal deficit present.     Mental Status: He is alert and oriented to person, place, and time. Mental status is at baseline.  Psychiatric:        Mood and Affect: Mood normal.        Behavior: Behavior normal.        Thought Content: Thought content normal.        Judgment: Judgment normal.     Results for orders placed or performed in visit on 62/13/08  Basic metabolic panel  Result Value Ref Range   Glucose 97 65 - 99 mg/dL   BUN 26 8 - 27 mg/dL   Creatinine, Ser 1.16 0.76 - 1.27 mg/dL   GFR calc non Af Amer 60 >59 mL/min/1.73   GFR calc Af Amer 69 >59 mL/min/1.73   BUN/Creatinine Ratio 22 10 - 24   Sodium 143 134 - 144 mmol/L   Potassium 4.6 3.5 - 5.2 mmol/L   Chloride 107 (H) 96 - 106 mmol/L   CO2 22 20 - 29 mmol/L   Calcium 8.8 8.6 - 10.2 mg/dL      Assessment & Plan:   Problem List Items Addressed This Visit      Genitourinary   Benign hypertensive renal disease - Primary    Under good control on current regimen. Continue current regimen. Continue to monitor. Call with any concerns. Refills given. Labs drawn today.       Relevant Orders   Basic metabolic panel     Other   Irritability    Doing so much better. Feeling like himself. Continue current regimen. Continue to monitor. Call with any concerns.        Other Visit Diagnoses    Screening for skin cancer       Would like to see dermatology. Referral generated today.   Relevant Orders   Ambulatory referral to Dermatology       Follow up plan: Return in about 6 months (around 05/27/2020) for Physical/wellness.

## 2019-11-26 NOTE — Assessment & Plan Note (Signed)
Doing so much better. Feeling like himself. Continue current regimen. Continue to monitor. Call with any concerns.

## 2019-11-26 NOTE — Assessment & Plan Note (Signed)
Under good control on current regimen. Continue current regimen. Continue to monitor. Call with any concerns. Refills given. Labs drawn today.   

## 2019-11-27 LAB — BASIC METABOLIC PANEL
BUN/Creatinine Ratio: 23 (ref 10–24)
BUN: 30 mg/dL — ABNORMAL HIGH (ref 8–27)
CO2: 22 mmol/L (ref 20–29)
Calcium: 8.7 mg/dL (ref 8.6–10.2)
Chloride: 107 mmol/L — ABNORMAL HIGH (ref 96–106)
Creatinine, Ser: 1.3 mg/dL — ABNORMAL HIGH (ref 0.76–1.27)
GFR calc Af Amer: 60 mL/min/{1.73_m2} (ref >59)
GFR calc non Af Amer: 52 mL/min/{1.73_m2} — ABNORMAL LOW (ref >59)
Glucose: 93 mg/dL (ref 65–99)
Potassium: 4.8 mmol/L (ref 3.5–5.2)
Sodium: 141 mmol/L (ref 134–144)

## 2019-12-25 ENCOUNTER — Other Ambulatory Visit: Payer: Self-pay | Admitting: Family Medicine

## 2020-01-06 ENCOUNTER — Other Ambulatory Visit: Payer: Self-pay | Admitting: Family Medicine

## 2020-01-09 ENCOUNTER — Other Ambulatory Visit: Payer: Self-pay | Admitting: Family Medicine

## 2020-01-14 ENCOUNTER — Other Ambulatory Visit: Payer: Medicare Other

## 2020-01-14 NOTE — Progress Notes (Signed)
[facility]  [address], [address] [place], [place] [address] Phone: [phone]  Fax: [phone]   Clinic Day:  01/15/2020  Referring physician: [name], DO   Chief Complaint: [name] is a 79 y.o. 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   HPI:  The patient was last seen in the medical oncology clinic on 07/17/2019 via telemedicine. At that time, he felt "fine".  He denied any fevers, sweats or weight loss.  He had issues with left ear congestion- improved with steroids. His counts were relatively stable and he continued fever and neutropenic precautions.  Labs at last visit showed a hematocrit of 28.8, hemoglobin 9.2, MCV 94.7, platelets 209,000, WBC 2,800 (ANC 1,500). Calcium was 8.7. Total protein was 6.2. Ferritin was 251 with an iron saturation of 22% and TIBC of 242. Vitamin B12 was 248 (low normal). LDH was 422. TSH was 3.650. folate was 42.0.  Labs on 08/19/2019 revealed a hematocrit 30.8, hemoglobin 9.9, MCV 94.2, platelets 208,000, WBC 2,600 (ANC 1,300). Vitamin B12 was 687.  During the interim, he has been "good." He thinks that he is taking oral Vitamin B12. His arthritis is stable. 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   His hematologist at [facility] left so he is planning on calling to set up an appointment with a new physician in 3 months.   Past Medical History:  Diagnosis Date  . Arthritis   . Benign hypertensive renal disease   . GERD (gastroesophageal reflux disease)   . Heartburn   . History of retinal detachment   . Hyperlipidemia   . Hypertension   . Melanoma ([place])    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 approximately 10-15 years ago  . Myelofibrosis ([place])   . Prostate hypertrophy   . Stroke [facility]) June 2009   R brain subcortical infarct    Past Surgical History:  Procedure Laterality Date  . ASPIRATION / INJECTION RENAL CYST  Feb 2017  .  BACK SURGERY     approx 20- 25 years ago  . GAS INSERTION  08/11/2011   Procedure: INSERTION OF GAS;  Surgeon: Hayden Pedro, MD;  Location: [place];  Service: Ophthalmology;  Laterality: Right;  [id number]  . SCLERAL BUCKLE  08/11/2011   Procedure: SCLERAL BUCKLE;  Surgeon: Hayden Pedro, MD;  Location: [place];  Service: Ophthalmology;  Laterality: Right;  Marland Kitchen VARICOSE VEIN SURGERY      Family History  Problem Relation Age of Onset  . Heart disease Father   . Diabetes Son   . Kidney disease Neg Hx   . Prostate cancer Neg Hx     Social History:  reports that he quit smoking about 9 years ago. His smoking use included cigarettes. He smoked 0.25 packs per day. He has never used smokeless tobacco. He reports that he does not drink alcohol and does not use drugs. 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 He lives in [place].  The patient is alone today.  Allergies:  Allergies  Allergen Reactions  . Meloxicam Nausea And Vomiting    Current Medications: Current Outpatient Medications  Medication Sig Dispense Refill  . calcium carbonate (OSCAL) 1500 (600 Ca) MG TABS tablet Take by mouth.    . Cholecalciferol (D3-1000 PO) Take by mouth.    . clopidogrel (PLAVIX) 75 MG tablet Take 1 tablet (  75 mg total) by mouth daily. 90 tablet 1  . diclofenac sodium (VOLTAREN) 1 % GEL Apply 4 g topically 4 (four) times daily as needed (for neck pain). 100 g 6  . DULoxetine (CYMBALTA) 20 MG capsule Take 1 capsule (20 mg total) by mouth daily. 90 capsule 1  . gabapentin (NEURONTIN) 300 MG capsule Take 300 mg by mouth 3 (three) times daily.    Alben Deeds Palmetto (MULTI GINSENG & SAW PALMETTO) 500 MG CAPS Take 500 mg by mouth daily.    Marland Kitchen losartan (COZAAR) 100 MG tablet Take 100 mg by mouth daily.    Marland Kitchen losartan-hydrochlorothiazide (HYZAAR) 100-25 MG tablet Take 1 tablet by mouth daily. 90 tablet 1  . meclizine (ANTIVERT) 25 MG tablet TAKE 1 TABLET BY MOUTH 3 TIMES  DAILY AS NEEDED FOR DIZZINESS 90 tablet 0  . Multiple Vitamin (MULITIVITAMIN WITH MINERALS) TABS Take 1 tablet by mouth daily.    . Naproxen Sodium (ALEVE) 220 MG CAPS Take by mouth as needed.     Marland Kitchen omeprazole (PRILOSEC) 40 MG capsule Take 1 capsule (40 mg total) by mouth daily. 90 capsule 1  . simvastatin (ZOCOR) 40 MG tablet TAKE 1 TABLET BY MOUTH DAILY 90 tablet 1  . tamsulosin (FLOMAX) 0.4 MG CAPS capsule Take 1 capsule (0.4 mg total) by mouth daily. 90 capsule 1  . zinc gluconate 50 MG tablet Take by mouth.    Marland Kitchen azelastine (ASTELIN) 0.1 % nasal spray Place 1 spray into both nostrils 2 (two) times daily. Use in each nostril as directed (Patient not taking: Reported on 10/31/2019) 30 mL 0  . tadalafil (CIALIS) 20 MG tablet Take 0.5-1 tablets (10-20 mg total) by mouth every other day as needed for erectile dysfunction. (Patient not taking: Reported on 10/31/2019) 5 tablet 11   No current facility-administered medications for this visit.    Review of Systems  Constitutional: Negative.  Negative for chills, diaphoresis, fever and malaise/fatigue.       Feels "good."   HENT: Positive for hearing loss (left ear; hearing aid). Negative for congestion, ear discharge, ear pain, nosebleeds, sinus pain, sore throat and tinnitus.   Eyes: Negative.  Negative for blurred vision, double vision and photophobia.  Respiratory: Negative.  Negative for cough, hemoptysis, sputum production and shortness of breath.   Cardiovascular: Negative.  Negative for chest pain, palpitations, orthopnea, leg swelling and PND.  Gastrointestinal: Negative.  Negative for abdominal pain, blood in stool, constipation, diarrhea, heartburn, melena, nausea and vomiting.       Denies early satiety.  Genitourinary: Negative for dysuria, frequency, hematuria and urgency.       BPH.  Musculoskeletal: Positive for joint pain (left knee arthritis). Negative for back pain, myalgias and neck pain.  Skin: Negative.  Negative for rash.   Neurological: Negative.  Negative for dizziness, tingling, sensory change, weakness and headaches.  Endo/Heme/Allergies: Negative.  Does not bruise/bleed easily.  Psychiatric/Behavioral: Negative.  Negative for depression, memory loss and substance abuse. The patient is not nervous/anxious and does not have insomnia.   All other systems reviewed and are negative.  Performance status (ECOG):  0  Vitals Blood pressure 119/70, pulse 61, temperature (!) 96.8 F (36 C), temperature source Tympanic, resp. rate 18, weight 185 lb 3 oz (84 kg), SpO2 98 %.   Physical Exam Nursing note reviewed.  Constitutional:      General: He is not in acute distress.    Appearance: He is well-developed. He is not diaphoretic.  HENT:  Head: Normocephalic and atraumatic.     Mouth/Throat:     Mouth: Mucous membranes are moist.     Pharynx: Oropharynx is clear.  Eyes:     General: No scleral icterus.    Extraocular Movements: Extraocular movements intact.     Conjunctiva/sclera: Conjunctivae normal.     Pupils: Pupils are equal, round, and reactive to light.     Comments: Blue eyes.   Cardiovascular:     Rate and Rhythm: Normal rate and regular rhythm.     Heart sounds: Normal heart sounds. No murmur heard.   Pulmonary:     Effort: Pulmonary effort is normal. No respiratory distress.     Breath sounds: Normal breath sounds. No wheezing or rales.  Chest:     Chest wall: No tenderness.  Abdominal:     General: Bowel sounds are normal. There is no distension.     Palpations: Abdomen is soft. There is splenomegaly (2 fingerbreadths below the left costal margin-stable). There is no mass.     Tenderness: There is no abdominal tenderness. There is no guarding or rebound.  Musculoskeletal:        General: No swelling or tenderness. Normal range of motion.     Cervical back: Normal range of motion and neck supple.  Lymphadenopathy:     Head:     Right side of head: No preauricular, posterior auricular  or occipital adenopathy.     Left side of head: No preauricular, posterior auricular or occipital adenopathy.     Cervical: No cervical adenopathy.     Upper Body:     Right upper body: No supraclavicular adenopathy.     Left upper body: No supraclavicular adenopathy.     Lower Body: No right inguinal adenopathy. No left inguinal adenopathy.  Skin:    General: Skin is warm and dry.  Neurological:     Mental Status: He is alert and oriented to person, place, and time.  Psychiatric:        Behavior: Behavior normal.        Thought Content: Thought content normal.        Judgment: Judgment normal.    Appointment on 01/15/2020  Component Date Value Ref Range Status  . LDH 01/15/2020 426* 98 - 192 U/L Final   Performed at [facility], [address] [place], [place] [address]  . Sodium 01/15/2020 137  135 - 145 mmol/L Final  . Potassium 01/15/2020 4.7  3.5 - 5.1 mmol/L Final  . Chloride 01/15/2020 105  98 - 111 mmol/L Final  . CO2 01/15/2020 26  22 - 32 mmol/L Final  . Glucose, Bld 01/15/2020 91  70 - 99 mg/dL Final   Glucose reference range applies only to samples taken after fasting for at least 8 hours.  . BUN 01/15/2020 29* 8 - 23 mg/dL Final  . Creatinine, Ser 01/15/2020 1.42* 0.61 - 1.24 mg/dL Final  . Calcium 01/15/2020 8.2* 8.9 - 10.3 mg/dL Final  . Total Protein 01/15/2020 6.4* 6.5 - 8.1 g/dL Final  . Albumin 01/15/2020 3.9  3.5 - 5.0 g/dL Final  . AST 01/15/2020 24  15 - 41 U/L Final  . ALT 01/15/2020 22  0 - 44 U/L Final  . Alkaline Phosphatase 01/15/2020 94  38 - 126 U/L Final  . Total Bilirubin 01/15/2020 1.2  0.3 - 1.2 mg/dL Final  . GFR calc non Af Amer 01/15/2020 47* >60 mL/min Final  . GFR calc Af Amer 01/15/2020 54* >60  mL/min Final  . Anion gap 01/15/2020 6  5 - 15 Final   Performed at [facility], [address], [place], [place] [address]  . WBC 01/15/2020 2.9* 4.0 - 10.5 K/uL Final  . RBC 01/15/2020 3.15* 4.22 - 5.81 MIL/uL Final   . Hemoglobin 01/15/2020 9.6* 13.0 - 17.0 g/dL Final  . HCT 01/15/2020 30.0* 39 - 52 % Final  . MCV 01/15/2020 95.2  80.0 - 100.0 fL Final  . MCH 01/15/2020 30.5  26.0 - 34.0 pg Final  . MCHC 01/15/2020 32.0  30.0 - 36.0 g/dL Final  . RDW 01/15/2020 16.9* 11.5 - 15.5 % Final  . Platelets 01/15/2020 254  150 - 400 K/uL Final  . nRBC 01/15/2020 3.5* 0.0 - 0.2 % Final  . Neutrophils Relative % 01/15/2020 41  % Final  . Neutro Abs 01/15/2020 1.3* 1.7 - 7.7 K/uL Final  . Band Neutrophils 01/15/2020 3  % Final  . Lymphocytes Relative 01/15/2020 27  % Final  . Lymphs Abs 01/15/2020 0.8  0.7 - 4.0 K/uL Final  . Monocytes Relative 01/15/2020 15  % Final  . Monocytes Absolute 01/15/2020 0.4  0 - 1 K/uL Final  . Eosinophils Relative 01/15/2020 0  % Final  . Eosinophils Absolute 01/15/2020 0.0  0 - 0 K/uL Final  . Basophils Relative 01/15/2020 0  % Final  . Basophils Absolute 01/15/2020 0.0  0 - 0 K/uL Final  . Smear Review 01/15/2020 MORPHOLOGY UNREMARKABLE   Final  . Other 01/15/2020 6  % Final  . nRBC 01/15/2020 3* 0 /100 WBC Final  . Metamyelocytes Relative 01/15/2020 5  % Final  . Myelocytes 01/15/2020 3  % Final  . Abs Immature Granulocytes 01/15/2020 0.20* 0.00 - 0.07 K/uL Final  . Tear Drop Cells 01/15/2020 PRESENT   Final  . Polychromasia 01/15/2020 PRESENT   Final   Performed at [facility], [address] [place], [place] [address]    Assessment:  [name] is a 79 y.o. 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 He presented with a long history of normocytic anemia unresponsive to oral iron. He has mild leukopenia. He has mild splenomegaly (15.8 cm) by abdominal ultrasound on 06/04/2015. DIPPS score is 72 (age 79- 1, hemoglobin less than 10- 2) and score of 4 if 1% circulating blasts included from 07/17/2014.  Bone marrowon 06/11/2014 was most consistent with primary myelofibrosis. Bone marrow biopsy showed 1% abnormal cells: CD45+, CD5+, CD10,  CD11c+/-, CD19+, CD2-+, (dim), CD22+ (dim, CD23+, CD38-/+, FMC7-, HLA-DR+, sig lambda+(dim).Blasts were not increased 1.2%; hypercellular for age: 85%; [id number] mutation was negative. CALR mutationpositive. 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 MDS FISH panel was negative.  Labs in 12/2015included the following normal studies: direct [place], epo level, haptoglobin, ANA, hemoglobin electrophoresis, hepatitis B surface antigen, HIV testing, UPEP. 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 LDH was elevated (520). Hepatitis C by PCR was less than 2 IU/ml.   He has B12 deficiency.  B12 was 248 on 07/16/2019 and 687 on 08/19/2019.  He is on oral B12.  The patient received the [place] COVID-19 vaccine on 08/01/2019 and 08/22/2019.  Symptomatically, he feels good.  Exam is stable.  Plan: 1.   Labs today: CBC with diff, CMP, LDH 2. Peripheral smear for path review. 3.   Primary myelofibrosis Clinically, he continues to do well  Exam is stable.               Hematocrit 30.0.  Hemoglobin 9.6.  MCV 95.2.  WBC 2900 with an ANC of 1300.  Counts remain stable.    Continue fever and neutropenia precautions.  Discuss plan for establishing new hematologist at [facility] for alternating visits. 4.   B12 deficiency  At last visit, hemoglobin had drifted down from 9.7 to 9.2.  He denied any bleeding.  Work-up revealed a ferritin of 51 with an iron saturation of 22% and a TIBC of 242.  B12 was 248.  He was started on oral B12.  Follow-up B12 was 687 on 08/19/2019. 5.   Patient to make appt with a new provide at [facility]. 6.   Continue alternating visits with [facility] in order that he is seen every 3 months.   7.   RTC in 6 months for MD assessment and labs (CBC with  diff, CMP, LDH).  I discussed the assessment and treatment plan with the patient.  The patient was provided an opportunity to ask questions and all were answered.  The patient agreed with the plan and demonstrated an understanding of the instructions.  The patient was advised to call back if the symptoms worsen or if the condition fails to improve as anticipated.   Lequita Asal, MD, PhD  01/15/2020, 5:00PM  I, De Burrs, am acting as Education administrator for [employer]. Mike Gip, MD, PhD.  I, Dietrich Samuelson C. Mike Gip, MD, have reviewed the above documentation for accuracy and completeness, and I agree with the above.

## 2020-01-15 ENCOUNTER — Encounter: Payer: Self-pay | Admitting: Hematology and Oncology

## 2020-01-15 ENCOUNTER — Inpatient Hospital Stay: Payer: Medicare Other

## 2020-01-15 ENCOUNTER — Other Ambulatory Visit: Payer: Self-pay

## 2020-01-15 ENCOUNTER — Inpatient Hospital Stay: Payer: Medicare Other | Attending: Hematology and Oncology | Admitting: Hematology and Oncology

## 2020-01-15 VITALS — BP 119/70 | HR 61 | Temp 96.8°F | Resp 18 | Wt 185.2 lb

## 2020-01-15 DIAGNOSIS — Z87891 Personal history of nicotine dependence: Secondary | ICD-10-CM | POA: Insufficient documentation

## 2020-01-15 DIAGNOSIS — E538 Deficiency of other specified B group vitamins: Secondary | ICD-10-CM | POA: Insufficient documentation

## 2020-01-15 DIAGNOSIS — D7581 Myelofibrosis: Secondary | ICD-10-CM | POA: Diagnosis not present

## 2020-01-15 DIAGNOSIS — D471 Chronic myeloproliferative disease: Secondary | ICD-10-CM | POA: Insufficient documentation

## 2020-01-15 DIAGNOSIS — D474 Osteomyelofibrosis: Secondary | ICD-10-CM

## 2020-01-15 DIAGNOSIS — D649 Anemia, unspecified: Secondary | ICD-10-CM | POA: Diagnosis not present

## 2020-01-15 DIAGNOSIS — I639 Cerebral infarction, unspecified: Secondary | ICD-10-CM | POA: Diagnosis not present

## 2020-01-15 LAB — CBC WITH DIFFERENTIAL/PLATELET
Abs Immature Granulocytes: 0.2 10*3/uL — ABNORMAL HIGH (ref 0.00–0.07)
Band Neutrophils: 3 %
Basophils Absolute: 0 10*3/uL (ref 0.0–0.1)
Basophils Relative: 0 %
Eosinophils Absolute: 0 10*3/uL (ref 0.0–0.5)
Eosinophils Relative: 0 %
HCT: 30 % — ABNORMAL LOW (ref 39.0–52.0)
Hemoglobin: 9.6 g/dL — ABNORMAL LOW (ref 13.0–17.0)
Lymphocytes Relative: 27 %
Lymphs Abs: 0.8 10*3/uL (ref 0.7–4.0)
MCH: 30.5 pg (ref 26.0–34.0)
MCHC: 32 g/dL (ref 30.0–36.0)
MCV: 95.2 fL (ref 80.0–100.0)
Metamyelocytes Relative: 5 %
Monocytes Absolute: 0.4 10*3/uL (ref 0.1–1.0)
Monocytes Relative: 15 %
Myelocytes: 3 %
Neutro Abs: 1.3 10*3/uL — ABNORMAL LOW (ref 1.7–7.7)
Neutrophils Relative %: 41 %
Other: 6 %
Platelets: 254 10*3/uL (ref 150–400)
RBC: 3.15 MIL/uL — ABNORMAL LOW (ref 4.22–5.81)
RDW: 16.9 % — ABNORMAL HIGH (ref 11.5–15.5)
WBC: 2.9 10*3/uL — ABNORMAL LOW (ref 4.0–10.5)
nRBC: 3 /100 WBC — ABNORMAL HIGH
nRBC: 3.5 % — ABNORMAL HIGH (ref 0.0–0.2)

## 2020-01-15 LAB — COMPREHENSIVE METABOLIC PANEL
ALT: 22 U/L (ref 0–44)
AST: 24 U/L (ref 15–41)
Albumin: 3.9 g/dL (ref 3.5–5.0)
Alkaline Phosphatase: 94 U/L (ref 38–126)
Anion gap: 6 (ref 5–15)
BUN: 29 mg/dL — ABNORMAL HIGH (ref 8–23)
CO2: 26 mmol/L (ref 22–32)
Calcium: 8.2 mg/dL — ABNORMAL LOW (ref 8.9–10.3)
Chloride: 105 mmol/L (ref 98–111)
Creatinine, Ser: 1.42 mg/dL — ABNORMAL HIGH (ref 0.61–1.24)
GFR calc Af Amer: 54 mL/min — ABNORMAL LOW (ref >60)
GFR calc non Af Amer: 47 mL/min — ABNORMAL LOW (ref >60)
Glucose, Bld: 91 mg/dL (ref 70–99)
Potassium: 4.7 mmol/L (ref 3.5–5.1)
Sodium: 137 mmol/L (ref 135–145)
Total Bilirubin: 1.2 mg/dL (ref 0.3–1.2)
Total Protein: 6.4 g/dL — ABNORMAL LOW (ref 6.5–8.1)

## 2020-01-15 LAB — LACTATE DEHYDROGENASE: LDH: 426 U/L — ABNORMAL HIGH (ref 98–192)

## 2020-01-15 NOTE — Progress Notes (Signed)
Loss of hearing in left ear. His doctor at [place] left so he hasnt been back, he needs an appointment.

## 2020-01-16 ENCOUNTER — Other Ambulatory Visit: Payer: Self-pay | Admitting: Family Medicine

## 2020-01-16 LAB — PATHOLOGIST SMEAR REVIEW

## 2020-02-05 ENCOUNTER — Other Ambulatory Visit: Payer: Self-pay | Admitting: Family Medicine

## 2020-02-05 NOTE — Telephone Encounter (Signed)
Requested medication (s) are due for refill today: yes  Requested medication (s) are on the active medication list:yes  Last refill: 07/23/19  #90  0 refills  Future visit scheduled:yes  Notes to clinic: Medication not delegated    Requested Prescriptions  Pending Prescriptions Disp Refills   meclizine (ANTIVERT) 25 MG tablet [Pharmacy Med Name: MECLIZINE HCL 25 MG TAB] 90 tablet 0    Sig: TAKE 1 TABLET BY MOUTH 3 TIMES DAILY AS NEEDED FOR DIZZINESS      Not Delegated - Gastroenterology: Antiemetics Failed - 02/05/2020  4:00 PM      Failed - This refill cannot be delegated      Passed - Valid encounter within last 6 months    Recent Outpatient Visits           2 months ago Benign hypertensive renal disease   [facility] [place], Megan P, DO   3 months ago Benign hypertensive renal disease   [facility] [place], [place], DO   3 months ago Irritability   [place], [place], DO   4 months ago Benign hypertensive renal disease   [facility] [place], Ida, DO   5 months ago Benign hypertensive renal disease   [facility] [name], DO       Future Appointments             In 1 month Ralene Bathe, MD [place]   In 3 months Wynetta Emery, Barb Merino, DO [facility]

## 2020-03-16 ENCOUNTER — Ambulatory Visit: Payer: Medicare Other | Admitting: Dermatology

## 2020-03-18 ENCOUNTER — Encounter: Payer: Self-pay | Admitting: Dermatology

## 2020-03-18 ENCOUNTER — Ambulatory Visit (INDEPENDENT_AMBULATORY_CARE_PROVIDER_SITE_OTHER): Payer: Medicare Other | Admitting: Dermatology

## 2020-03-18 ENCOUNTER — Other Ambulatory Visit: Payer: Self-pay

## 2020-03-18 DIAGNOSIS — D1800 Hemangioma unspecified site: Secondary | ICD-10-CM

## 2020-03-18 DIAGNOSIS — L720 Epidermal cyst: Secondary | ICD-10-CM | POA: Diagnosis not present

## 2020-03-18 DIAGNOSIS — L821 Other seborrheic keratosis: Secondary | ICD-10-CM

## 2020-03-18 DIAGNOSIS — L570 Actinic keratosis: Secondary | ICD-10-CM | POA: Diagnosis not present

## 2020-03-18 DIAGNOSIS — L814 Other melanin hyperpigmentation: Secondary | ICD-10-CM | POA: Diagnosis not present

## 2020-03-18 DIAGNOSIS — Z1283 Encounter for screening for malignant neoplasm of skin: Secondary | ICD-10-CM | POA: Diagnosis not present

## 2020-03-18 DIAGNOSIS — I639 Cerebral infarction, unspecified: Secondary | ICD-10-CM

## 2020-03-18 DIAGNOSIS — L820 Inflamed seborrheic keratosis: Secondary | ICD-10-CM | POA: Diagnosis not present

## 2020-03-18 DIAGNOSIS — D229 Melanocytic nevi, unspecified: Secondary | ICD-10-CM | POA: Diagnosis not present

## 2020-03-18 DIAGNOSIS — L578 Other skin changes due to chronic exposure to nonionizing radiation: Secondary | ICD-10-CM | POA: Diagnosis not present

## 2020-03-18 NOTE — Patient Instructions (Signed)
   Pre-Operative Instructions  You are scheduled for a surgical procedure at [facility]. We recommend you read the following instructions. If you have any questions or concerns, please call the office at [phone].  1. Shower and wash the entire body with soap and water the day of your surgery paying special attention to cleansing at and around the planned surgery site.  2. Avoid aspirin or aspirin containing products at least fourteen (14) days prior to your surgical procedure and for at least one week (7 Days) after your surgical procedure. If you take aspirin on a regular basis for heart disease or history of stroke or for any other reason, we may recommend you continue taking aspirin but please notify us if you take this on a regular basis. Aspirin can cause more bleeding to occur during surgery as well as prolonged bleeding and bruising after surgery.   3. Avoid other nonsteroidal pain medications at least one week prior to surgery and at least one week prior to your surgery. These include medications such as Ibuprofen (Motrin, Advil and Nuprin), Naprosyn, Voltaren, Relafen, etc. If medications are used for therapeutic reasons, please inform us as they can cause increased bleeding or prolonged bleeding during and bruising after surgical procedures.   4. Please advise us if you are taking any "blood thinner" medications such as Coumadin or Dipyridamole or Plavix or similar medications. These cause increased bleeding and prolonged bleeding during procedures and bruising after surgical procedures. We may have to consider discontinuing these medications briefly prior to and shortly after your surgery if safe to do so.   5. Please inform us of all medications you are currently taking. All medications that are taken regularly should be taken the day of surgery as you always do. Nevertheless, we need to be informed of what medications you are taking prior to surgery to know whether they  will affect the procedure or cause any complications.   6. Please inform us of any medication allergies. Also inform us of whether you have allergies to Latex or rubber products or whether you have had any adverse reaction to Lidocaine or Epinephrine.  7. Please inform us of any prosthetic or artificial body parts such as artificial heart valve, joint replacements, etc., or similar condition that might require preoperative antibiotics.   8. We recommend avoidance of alcohol at least two weeks prior to surgery and continued avoidance for at least two weeks after surgery.   9. We recommend discontinuation of tobacco smoking at least two weeks prior to surgery and continued abstinence for at least two weeks after surgery.  10. Do not plan strenuous exercise, strenuous work or strenuous lifting for approximately four weeks after your surgery.   11. We request if you are unable to make your scheduled surgical appointment, please call us at least a week in advance or as soon as you are aware of a problem so that we can cancel or reschedule the appointment.   12. You MAY TAKE TYLENOL (acetaminophen) for pain as it is not a blood thinner.   13. PLEASE PLAN TO BE IN TOWN FOR TWO WEEKS FOLLOWING SURGERY, THIS IS IMPORTANT SO YOU CAN BE CHECKED FOR DRESSING CHANGES, SUTURE REMOVAL AND TO MONITOR FOR POSSIBLE COMPLICATIONS.     Cryotherapy Aftercare  . Wash gently with soap and water everyday.   . Apply Vaseline and Band-Aid daily until healed.  

## 2020-03-18 NOTE — Progress Notes (Signed)
   New Patient Visit  Subjective  [name] is a 79 y.o. male who presents for the following: Annual Exam (New pt present for UBSE ). Pt report he has a history of skin cancers several years ago. Pt concerned about spots on his face, ears, chest and back today.  The patient presents for Upper Body Skin Exam (UBSE) for skin cancer screening and mole check.  The following portions of the chart were reviewed this encounter and updated as appropriate:  Tobacco  Allergies  Meds  Problems  Med Hx  Surg Hx  Fam Hx     Review of Systems:  No other skin or systemic complaints except as noted in HPI or Assessment and Plan.  Objective  Well appearing patient in no apparent distress; mood and affect are within normal limits.  All skin waist up examined.  Objective  Left lat elbow, face (4): Erythematous thin papules/macules with gritty scale.   Objective  arms (6): Erythematous keratotic or waxy stuck-on papule or plaque.   Objective  upper back spinal: 3.2 cm Subcutaneous nodule. Subcutaneous nodule.    Assessment & Plan  AK (actinic keratosis) x 4 Left lat elbow, face  Destruction of lesion - Left lat elbow, face Complexity: simple   Destruction method: cryotherapy   Informed consent: discussed and consent obtained   Timeout:  patient name, date of birth, surgical site, and procedure verified Lesion destroyed using liquid nitrogen: Yes   Region frozen until ice ball extended beyond lesion: Yes   Outcome: patient tolerated procedure well with no complications   Post-procedure details: wound care instructions given    Inflamed seborrheic keratosis (6) arms  Destruction of lesion - arms Complexity: simple   Destruction method: cryotherapy   Informed consent: discussed and consent obtained   Timeout:  patient name, date of birth, surgical site, and procedure verified Lesion destroyed using liquid nitrogen: Yes   Region frozen until ice ball extended beyond lesion:  Yes   Outcome: patient tolerated procedure well with no complications   Post-procedure details: wound care instructions given    Epidermal inclusion cyst upper back spinal Symptomatic cyst  Recommend surgery removal    Lentigines - Scattered tan macules - Discussed due to sun exposure - Benign, observe - Call for any changes  Seborrheic Keratoses - Stuck-on, waxy, tan-brown papules and plaques  - Discussed benign etiology and prognosis. - Observe - Call for any changes  Melanocytic Nevi - Tan-brown and/or pink-flesh-colored symmetric macules and papules - Benign appearing on exam today - Observation - Call clinic for new or changing moles - Recommend daily use of broad spectrum spf 30+ sunscreen to sun-exposed areas.   Hemangiomas - Red papules - Discussed benign nature - Observe - Call for any changes  Actinic Damage - diffuse scaly erythematous macules with underlying dyspigmentation - Recommend daily broad spectrum sunscreen SPF 30+ to sun-exposed areas, reapply every 2 hours as needed.  - Call for new or changing lesions.  Skin cancer screening performed today.  Return for cyst surgery .  IMarye Round, CMA, am acting as scribe for Sarina Ser, MD .  Documentation: I have reviewed the above documentation for accuracy and completeness, and I agree with the above.  Sarina Ser, MD

## 2020-03-31 ENCOUNTER — Other Ambulatory Visit: Payer: Self-pay | Admitting: Family Medicine

## 2020-03-31 NOTE — Telephone Encounter (Signed)
Requested Prescriptions  Pending Prescriptions Disp Refills  . simvastatin (ZOCOR) 40 MG tablet [Pharmacy Med Name: SIMVASTATIN 40 MG TAB] 90 tablet 1    Sig: TAKE 1 TABLET BY MOUTH DAILY     Cardiovascular:  Antilipid - Statins Failed - 03/31/2020 10:02 AM      Failed - LDL in normal range and within 360 days    LDL Chol Calc (NIH)  Date Value Ref Range Status  09/05/2019 78 0 - 99 mg/dL Final         Failed - HDL in normal range and within 360 days    HDL  Date Value Ref Range Status  09/05/2019 34 (L) >39 mg/dL Final         Passed - Total Cholesterol in normal range and within 360 days    Cholesterol, Total  Date Value Ref Range Status  09/05/2019 131 100 - 199 mg/dL Final   Cholesterol Piccolo, Waived  Date Value Ref Range Status  04/14/2016 106 <200 mg/dL Final    Comment:                            Desirable                <200                         Borderline High      200- 239                         High                     >239          Passed - Triglycerides in normal range and within 360 days    Triglycerides  Date Value Ref Range Status  09/05/2019 100 0 - 149 mg/dL Final   Triglycerides Piccolo,Waived  Date Value Ref Range Status  04/14/2016 99 <150 mg/dL Final    Comment:                            Normal                   <150                         Borderline High     150 - 199                         High                200 - 499                         Very High                >499          Passed - Patient is not pregnant      Passed - Valid encounter within last 12 months    Recent Outpatient Visits          4 months ago Benign hypertensive renal disease   [facility] [place], Megan P, DO   5 months ago Benign hypertensive renal disease   [facility] [place], [place], DO  5 months ago Irritability   [place], [place] P, DO   6 months ago Benign hypertensive renal disease   [facility] [place], [place], DO   6 months ago Benign hypertensive renal disease   [facility] [name], DO      Future Appointments            In 1 month Johnson, Barb Merino, DO [facility]

## 2020-04-06 DIAGNOSIS — M5033 Other cervical disc degeneration, cervicothoracic region: Secondary | ICD-10-CM | POA: Diagnosis not present

## 2020-04-06 DIAGNOSIS — M9901 Segmental and somatic dysfunction of cervical region: Secondary | ICD-10-CM | POA: Diagnosis not present

## 2020-04-06 DIAGNOSIS — M9903 Segmental and somatic dysfunction of lumbar region: Secondary | ICD-10-CM | POA: Diagnosis not present

## 2020-04-06 DIAGNOSIS — M5412 Radiculopathy, cervical region: Secondary | ICD-10-CM | POA: Diagnosis not present

## 2020-04-08 DIAGNOSIS — M9903 Segmental and somatic dysfunction of lumbar region: Secondary | ICD-10-CM | POA: Diagnosis not present

## 2020-04-08 DIAGNOSIS — M9901 Segmental and somatic dysfunction of cervical region: Secondary | ICD-10-CM | POA: Diagnosis not present

## 2020-04-08 DIAGNOSIS — M5412 Radiculopathy, cervical region: Secondary | ICD-10-CM | POA: Diagnosis not present

## 2020-04-08 DIAGNOSIS — M5033 Other cervical disc degeneration, cervicothoracic region: Secondary | ICD-10-CM | POA: Diagnosis not present

## 2020-04-11 ENCOUNTER — Other Ambulatory Visit: Payer: Self-pay | Admitting: Family Medicine

## 2020-04-11 NOTE — Telephone Encounter (Signed)
Requested Prescriptions  Pending Prescriptions Disp Refills  . losartan-hydrochlorothiazide (HYZAAR) 100-25 MG tablet [Pharmacy Med Name: LOSARTAN POTASSIUM-HCTZ 100-25 MG T] 90 tablet 0    Sig: TAKE 1 TABLET BY MOUTH DAILY.     Cardiovascular: ARB + Diuretic Combos Failed - 04/11/2020  9:46 AM      Failed - Cr in normal range and within 180 days    Creatinine  Date Value Ref Range Status  06/18/2014 1.09 0.60 - 1.30 mg/dL Final   Creatinine, Ser  Date Value Ref Range Status  01/15/2020 1.42 (H) 0.61 - 1.24 mg/dL Final         Failed - Ca in normal range and within 180 days    Calcium  Date Value Ref Range Status  01/15/2020 8.2 (L) 8.9 - 10.3 mg/dL Final   Calcium, Total  Date Value Ref Range Status  06/18/2014 8.5 8.5 - 10.1 mg/dL Final         Passed - K in normal range and within 180 days    Potassium  Date Value Ref Range Status  01/15/2020 4.7 3.5 - 5.1 mmol/L Final  06/18/2014 4.5 3.5 - 5.1 mmol/L Final         Passed - Na in normal range and within 180 days    Sodium  Date Value Ref Range Status  01/15/2020 137 135 - 145 mmol/L Final  11/26/2019 141 134 - 144 mmol/L Final  06/18/2014 141 136 - 145 mmol/L Final         Passed - Patient is not pregnant      Passed - Last BP in normal range    BP Readings from Last 1 Encounters:  01/15/20 119/70         Passed - Valid encounter within last 6 months    Recent Outpatient Visits          4 months ago Benign hypertensive renal disease   [facility] [place], Megan P, DO   5 months ago Benign hypertensive renal disease   [facility] [place], [place], DO   5 months ago Irritability   [place], [place], DO   6 months ago Benign hypertensive renal disease   [facility] [place], Megan P, DO   7 months ago Benign hypertensive renal disease   [facility] [place], Barb Merino, DO      Future Appointments            In 1 month Johnson,  Barb Merino, DO [facility]

## 2020-04-13 DIAGNOSIS — M9903 Segmental and somatic dysfunction of lumbar region: Secondary | ICD-10-CM | POA: Diagnosis not present

## 2020-04-13 DIAGNOSIS — M5412 Radiculopathy, cervical region: Secondary | ICD-10-CM | POA: Diagnosis not present

## 2020-04-13 DIAGNOSIS — M9901 Segmental and somatic dysfunction of cervical region: Secondary | ICD-10-CM | POA: Diagnosis not present

## 2020-04-13 DIAGNOSIS — M5033 Other cervical disc degeneration, cervicothoracic region: Secondary | ICD-10-CM | POA: Diagnosis not present

## 2020-04-15 DIAGNOSIS — M9903 Segmental and somatic dysfunction of lumbar region: Secondary | ICD-10-CM | POA: Diagnosis not present

## 2020-04-15 DIAGNOSIS — M5412 Radiculopathy, cervical region: Secondary | ICD-10-CM | POA: Diagnosis not present

## 2020-04-15 DIAGNOSIS — M5033 Other cervical disc degeneration, cervicothoracic region: Secondary | ICD-10-CM | POA: Diagnosis not present

## 2020-04-15 DIAGNOSIS — M9901 Segmental and somatic dysfunction of cervical region: Secondary | ICD-10-CM | POA: Diagnosis not present

## 2020-04-18 ENCOUNTER — Other Ambulatory Visit: Payer: Self-pay | Admitting: Family Medicine

## 2020-04-18 NOTE — Telephone Encounter (Signed)
Requested Prescriptions  Pending Prescriptions Disp Refills   omeprazole (PRILOSEC) 40 MG capsule [Pharmacy Med Name: OMEPRAZOLE DR 40 MG CAP] 90 capsule 0    Sig: TAKE 1 CAPSULE (40 MG) BY MOUTH EVERY DAY     Gastroenterology: Proton Pump Inhibitors Passed - 04/18/2020 10:26 AM      Passed - Valid encounter within last 12 months    Recent Outpatient Visits          4 months ago Benign hypertensive renal disease   [facility] [place], Megan P, DO   5 months ago Benign hypertensive renal disease   [facility] [place], [place], DO   6 months ago Irritability   [place], [place], DO   6 months ago Benign hypertensive renal disease   [facility] [place], Megan P, DO   7 months ago Benign hypertensive renal disease   [facility] [place], Barb Merino, DO      Future Appointments            In 1 month Johnson, Barb Merino, DO [facility]

## 2020-04-20 DIAGNOSIS — M9903 Segmental and somatic dysfunction of lumbar region: Secondary | ICD-10-CM | POA: Diagnosis not present

## 2020-04-20 DIAGNOSIS — M5033 Other cervical disc degeneration, cervicothoracic region: Secondary | ICD-10-CM | POA: Diagnosis not present

## 2020-04-20 DIAGNOSIS — M9901 Segmental and somatic dysfunction of cervical region: Secondary | ICD-10-CM | POA: Diagnosis not present

## 2020-04-20 DIAGNOSIS — M5412 Radiculopathy, cervical region: Secondary | ICD-10-CM | POA: Diagnosis not present

## 2020-04-22 DIAGNOSIS — M9903 Segmental and somatic dysfunction of lumbar region: Secondary | ICD-10-CM | POA: Diagnosis not present

## 2020-04-22 DIAGNOSIS — M5033 Other cervical disc degeneration, cervicothoracic region: Secondary | ICD-10-CM | POA: Diagnosis not present

## 2020-04-22 DIAGNOSIS — M9901 Segmental and somatic dysfunction of cervical region: Secondary | ICD-10-CM | POA: Diagnosis not present

## 2020-04-22 DIAGNOSIS — M5412 Radiculopathy, cervical region: Secondary | ICD-10-CM | POA: Diagnosis not present

## 2020-04-27 DIAGNOSIS — M5412 Radiculopathy, cervical region: Secondary | ICD-10-CM | POA: Diagnosis not present

## 2020-04-27 DIAGNOSIS — M9903 Segmental and somatic dysfunction of lumbar region: Secondary | ICD-10-CM | POA: Diagnosis not present

## 2020-04-27 DIAGNOSIS — M5033 Other cervical disc degeneration, cervicothoracic region: Secondary | ICD-10-CM | POA: Diagnosis not present

## 2020-04-27 DIAGNOSIS — M9901 Segmental and somatic dysfunction of cervical region: Secondary | ICD-10-CM | POA: Diagnosis not present

## 2020-04-29 DIAGNOSIS — M9901 Segmental and somatic dysfunction of cervical region: Secondary | ICD-10-CM | POA: Diagnosis not present

## 2020-04-29 DIAGNOSIS — M5033 Other cervical disc degeneration, cervicothoracic region: Secondary | ICD-10-CM | POA: Diagnosis not present

## 2020-04-29 DIAGNOSIS — M5412 Radiculopathy, cervical region: Secondary | ICD-10-CM | POA: Diagnosis not present

## 2020-04-29 DIAGNOSIS — M9903 Segmental and somatic dysfunction of lumbar region: Secondary | ICD-10-CM | POA: Diagnosis not present

## 2020-05-05 ENCOUNTER — Ambulatory Visit: Payer: Medicare Other

## 2020-05-05 ENCOUNTER — Other Ambulatory Visit: Payer: Self-pay

## 2020-05-05 ENCOUNTER — Ambulatory Visit (INDEPENDENT_AMBULATORY_CARE_PROVIDER_SITE_OTHER): Payer: Medicare Other | Admitting: Family Medicine

## 2020-05-05 DIAGNOSIS — Z23 Encounter for immunization: Secondary | ICD-10-CM

## 2020-05-05 NOTE — Progress Notes (Unsigned)
Patient presents today for flu vaccination, received in Left deltoid, patient tolerated well.

## 2020-05-07 ENCOUNTER — Other Ambulatory Visit: Payer: Self-pay | Admitting: Family Medicine

## 2020-05-07 NOTE — Telephone Encounter (Signed)
Requested medication (s) are due for refill today: yes  Requested medication (s) are on the active medication list: yes  Last refill:  02/06/2020  Future visit scheduled: no  Notes to clinic: this refill cannot be delegated    Requested Prescriptions  Pending Prescriptions Disp Refills   meclizine (ANTIVERT) 25 MG tablet [Pharmacy Med Name: MECLIZINE HCL 25 MG TAB] 90 tablet 0    Sig: TAKE 1 TABLET BY MOUTH 3 TIMES DAILY AS NEEDED FOR DIZZINESS      Not Delegated - Gastroenterology: Antiemetics Failed - 05/07/2020  1:44 PM      Failed - This refill cannot be delegated      Passed - Valid encounter within last 6 months    Recent Outpatient Visits           2 days ago Need for influenza vaccination   [place], Megan P, DO   5 months ago Benign hypertensive renal disease   [facility] [place], [place], DO   6 months ago Benign hypertensive renal disease   [facility] [place], [place], DO   6 months ago Irritability   [place], [place], DO   7 months ago Benign hypertensive renal disease   [facility] [place], Barb Merino, DO       Future Appointments             In 2 weeks Wynetta Emery, Barb Merino, DO [facility]

## 2020-05-07 NOTE — Telephone Encounter (Signed)
Routing to provider  

## 2020-05-12 ENCOUNTER — Other Ambulatory Visit: Payer: Self-pay

## 2020-05-12 ENCOUNTER — Ambulatory Visit (INDEPENDENT_AMBULATORY_CARE_PROVIDER_SITE_OTHER): Payer: Medicare Other | Admitting: Dermatology

## 2020-05-12 DIAGNOSIS — I639 Cerebral infarction, unspecified: Secondary | ICD-10-CM

## 2020-05-12 DIAGNOSIS — L820 Inflamed seborrheic keratosis: Secondary | ICD-10-CM

## 2020-05-12 DIAGNOSIS — L720 Epidermal cyst: Secondary | ICD-10-CM | POA: Diagnosis not present

## 2020-05-12 DIAGNOSIS — D485 Neoplasm of uncertain behavior of skin: Secondary | ICD-10-CM

## 2020-05-12 MED ORDER — MUPIROCIN 2 % EX OINT: 1.0000 "application " | TOPICAL_OINTMENT | Freq: Every day | CUTANEOUS | 0 refills | Status: DC

## 2020-05-12 NOTE — Patient Instructions (Signed)

## 2020-05-12 NOTE — Progress Notes (Signed)
Follow-Up Visit   Subjective  [name] is a 79 y.o. male who presents for the following: Cyst (vs other of upper back spinal - Excise today) and Other (Spot of right cheek/eyelid that feels scaly).  The following portions of the chart were reviewed this encounter and updated as appropriate:   Tobacco  Allergies  Meds  Problems  Med Hx  Surg Hx  Fam Hx     Review of Systems:  No other skin or systemic complaints except as noted in HPI or Assessment and Plan.  Objective  Well appearing patient in no apparent distress; mood and affect are within normal limits.  A focused examination was performed including face, back. Relevant physical exam findings are noted in the Assessment and Plan.  Objective  Upper back spinal: 4.2 x 2.2 cm cystic papule  Objective  Right Lower Eyelid: Erythematous keratotic or waxy stuck-on papule or plaque.    Assessment & Plan  Neoplasm of uncertain behavior of skin Upper back spinal  Skin excision  Lesion length (cm):  4.2 Lesion width (cm):  2.2 Margin per side (cm):  0 Total excision diameter (cm):  4.2 Informed consent: discussed and consent obtained   Timeout: patient name, date of birth, surgical site, and procedure verified   Procedure prep:  Patient was prepped and draped in usual sterile fashion Prep type:  Isopropyl alcohol and povidone-iodine Anesthesia: the lesion was anesthetized in a standard fashion   Anesthetic:  1% lidocaine w/ epinephrine 1-100,000 buffered w/ 8.4% NaHCO3 (11cc) Instrument used: #15 blade   Hemostasis achieved with: pressure   Hemostasis achieved with comment:  Electrocautery Outcome: patient tolerated procedure well with no complications   Post-procedure details: sterile dressing applied and wound care instructions given   Dressing type: bandage and pressure dressing (mupirocin)    Skin repair Complexity:  Complex Final length (cm):  5 Reason for type of repair: reduce tension to allow  closure, reduce the risk of dehiscence, infection, and necrosis, reduce subcutaneous dead space and avoid a hematoma, allow closure of the large defect, preserve normal anatomy, preserve normal anatomical and functional relationships and enhance both functionality and cosmetic results   Undermining: area extensively undermined   Undermining comment:  Undermining defect 4.2 cm Subcutaneous layers (deep stitches):  Suture size:  2-0 Suture type: Vicryl (polyglactin 910)   Subcutaneous suture technique: inverted dermal. Fine/surface layer approximation (top stitches):  Suture type: nylon   Stitches: horizontal mattress   Suture removal (days):  7 Hemostasis achieved with: suture and pressure Outcome: patient tolerated procedure well with no complications   Post-procedure details: sterile dressing applied and wound care instructions given   Dressing type: bandage and pressure dressing (mupirocin)    mupirocin ointment (BACTROBAN) 2 %  Specimen 1 - Surgical pathology Differential Diagnosis: Cyst vs other  Check Margins: No 4.2 x 2.2 cm cystic papule  Inflamed seborrheic keratosis Right Lower Eyelid  Destruction of lesion - Right Lower Eyelid Complexity: simple   Destruction method: cryotherapy   Informed consent: discussed and consent obtained   Timeout:  patient name, date of birth, surgical site, and procedure verified Lesion destroyed using liquid nitrogen: Yes   Region frozen until ice ball extended beyond lesion: Yes   Outcome: patient tolerated procedure well with no complications   Post-procedure details: wound care instructions given    Return in about 1 week (around 05/19/2020) for suture removal.  I, Ashok Cordia, CMA, am acting as scribe for Sarina Ser, MD .  Documentation: I have reviewed the above documentation for accuracy and completeness, and I agree with the above.  Sarina Ser, MD

## 2020-05-13 ENCOUNTER — Telehealth: Payer: Self-pay

## 2020-05-13 NOTE — Telephone Encounter (Signed)
Left message for patient regarding surgery/hd  

## 2020-05-14 ENCOUNTER — Other Ambulatory Visit: Payer: Self-pay | Admitting: Family Medicine

## 2020-05-14 NOTE — Telephone Encounter (Signed)
Requested Prescriptions  Pending Prescriptions Disp Refills  . DULoxetine (CYMBALTA) 20 MG capsule [Pharmacy Med Name: DULOXETINE HCL 20 MG CAP] 90 capsule 0    Sig: TAKE 1 CAPSULE BY MOUTH EVERY DAY     Psychiatry: Antidepressants - SNRI Passed - 05/14/2020  4:01 PM      Passed - Last BP in normal range    BP Readings from Last 1 Encounters:  01/15/20 119/70         Passed - Valid encounter within last 6 months    Recent Outpatient Visits          1 week ago Need for influenza vaccination   [place], Megan P, DO   5 months ago Benign hypertensive renal disease   [facility] [place], [place], DO   6 months ago Benign hypertensive renal disease   [facility] [place], [place], DO   6 months ago Irritability   [place], [place], DO   7 months ago Benign hypertensive renal disease   [place], [place], DO      Future Appointments            In 5 days Ralene Bathe, MD [place]   In 1 week Wynetta Emery, Barb Merino, DO [facility]

## 2020-05-18 ENCOUNTER — Encounter: Payer: Self-pay | Admitting: Dermatology

## 2020-05-19 ENCOUNTER — Other Ambulatory Visit: Payer: Self-pay

## 2020-05-19 ENCOUNTER — Ambulatory Visit (INDEPENDENT_AMBULATORY_CARE_PROVIDER_SITE_OTHER): Payer: Medicare Other | Admitting: Dermatology

## 2020-05-19 DIAGNOSIS — Z4802 Encounter for removal of sutures: Secondary | ICD-10-CM

## 2020-05-19 DIAGNOSIS — L720 Epidermal cyst: Secondary | ICD-10-CM

## 2020-05-19 MED ORDER — DOXYCYCLINE HYCLATE 100 MG PO TABS: 100.0000 mg | ORAL_TABLET | Freq: Two times a day (BID) | ORAL | 0 refills | Status: AC

## 2020-05-19 NOTE — Progress Notes (Signed)
   Follow-Up Visit   Subjective  [name] is a 79 y.o. male who presents for the following: Suture / Staple Removal (1 week suture removal biopsy proven Cyst upper back spinal 05-12-2020 ).  The following portions of the chart were reviewed this encounter and updated as appropriate:   Tobacco  Allergies  Meds  Problems  Med Hx  Surg Hx  Fam Hx     Review of Systems:  No other skin or systemic complaints except as noted in HPI or Assessment and Plan.  Objective  Well appearing patient in no apparent distress; mood and affect are within normal limits.  A focused examination was performed including back . Relevant physical exam findings are noted in the Assessment and Plan.  Objective  Upper back spinal: Well healed scar    Assessment & Plan  Epidermal inclusion cyst Upper back spinal  Biopsy proven Cyst discussed    Encounter for Removal of Sutures - Incision site at the upper back spinal  is clean, dry and intact - Wound cleansed, sutures removed, wound cleansed and steri strips applied.  - Discussed pathology results showing Epidermal cyst  - Patient advised to keep steri-strips dry until they fall off. - Scars remodel for a full year. - Once steri-strips fall off, patient can apply over-the-counter silicone scar cream each night to help with scar remodeling if desired. - Patient advised to call with any concerns or if they notice any new or changing lesions.   Start Doxycyline 100 mg take 1 tablet po bid x 7 days with food #14 0 RF   Ordered Medications: doxycycline (VIBRA-TABS) 100 MG tablet  Return in about 4 months (around 09/17/2020).  IMarye Round, CMA, am acting as scribe for Sarina Ser, MD .  Documentation: I have reviewed the above documentation for accuracy and completeness, and I agree with the above.  Sarina Ser, MD

## 2020-05-24 ENCOUNTER — Encounter: Payer: Self-pay | Admitting: Dermatology

## 2020-05-25 ENCOUNTER — Ambulatory Visit (INDEPENDENT_AMBULATORY_CARE_PROVIDER_SITE_OTHER): Payer: Medicare Other | Admitting: Family Medicine

## 2020-05-25 ENCOUNTER — Encounter: Payer: Self-pay | Admitting: Family Medicine

## 2020-05-25 ENCOUNTER — Other Ambulatory Visit: Payer: Self-pay

## 2020-05-25 VITALS — BP 136/81 | HR 65 | Temp 98.0°F | Ht 68.0 in | Wt 192.0 lb

## 2020-05-25 DIAGNOSIS — E782 Mixed hyperlipidemia: Secondary | ICD-10-CM

## 2020-05-25 DIAGNOSIS — R454 Irritability and anger: Secondary | ICD-10-CM

## 2020-05-25 DIAGNOSIS — K219 Gastro-esophageal reflux disease without esophagitis: Secondary | ICD-10-CM | POA: Diagnosis not present

## 2020-05-25 DIAGNOSIS — Z1159 Encounter for screening for other viral diseases: Secondary | ICD-10-CM

## 2020-05-25 DIAGNOSIS — D649 Anemia, unspecified: Secondary | ICD-10-CM | POA: Diagnosis not present

## 2020-05-25 DIAGNOSIS — I129 Hypertensive chronic kidney disease with stage 1 through stage 4 chronic kidney disease, or unspecified chronic kidney disease: Secondary | ICD-10-CM

## 2020-05-25 DIAGNOSIS — I639 Cerebral infarction, unspecified: Secondary | ICD-10-CM | POA: Diagnosis not present

## 2020-05-25 LAB — MICROALBUMIN, URINE WAIVED
Creatinine, Urine Waived: 200 mg/dL (ref 10–300)
Microalb, Ur Waived: 150 mg/L — ABNORMAL HIGH (ref 0–19)
Microalb/Creat Ratio: >300 mg/g — ABNORMAL HIGH (ref <30)

## 2020-05-25 LAB — URINALYSIS, ROUTINE W REFLEX MICROSCOPIC
Bilirubin, UA: NEGATIVE
Glucose, UA: NEGATIVE
Ketones, UA: NEGATIVE
Leukocytes,UA: NEGATIVE
Nitrite, UA: NEGATIVE
RBC, UA: NEGATIVE
Specific Gravity, UA: >1.03 — ABNORMAL HIGH (ref 1.005–1.030)
Urobilinogen, Ur: 0.2 mg/dL (ref 0.2–1.0)
pH, UA: 6 (ref 5.0–7.5)

## 2020-05-25 LAB — MICROSCOPIC EXAMINATION
Bacteria, UA: NONE SEEN
RBC, Urine: NONE SEEN /hpf (ref 0–2)
WBC, UA: NONE SEEN /hpf (ref 0–5)

## 2020-05-25 MED ORDER — OMEPRAZOLE 40 MG PO CPDR: DELAYED_RELEASE_CAPSULE | ORAL | 1 refills | Status: DC

## 2020-05-25 MED ORDER — SIMVASTATIN 40 MG PO TABS: 40.0000 mg | ORAL_TABLET | Freq: Every day | ORAL | 1 refills | Status: DC

## 2020-05-25 MED ORDER — LOSARTAN POTASSIUM-HCTZ 100-25 MG PO TABS: 1.0000 | ORAL_TABLET | Freq: Every day | ORAL | 1 refills | Status: DC

## 2020-05-25 MED ORDER — CLOPIDOGREL BISULFATE 75 MG PO TABS: 75.0000 mg | ORAL_TABLET | Freq: Every day | ORAL | 1 refills | Status: DC

## 2020-05-25 MED ORDER — TAMSULOSIN HCL 0.4 MG PO CAPS: 0.4000 mg | ORAL_CAPSULE | Freq: Every day | ORAL | 1 refills | Status: DC

## 2020-05-25 NOTE — Assessment & Plan Note (Signed)
Under good control on current regimen. Continue current regimen. Continue to monitor. Call with any concerns. Refills given.   

## 2020-05-25 NOTE — Progress Notes (Signed)
BP 136/81   Pulse 65   Temp 98 F (36.7 C)   Ht 5\' 8"  (1.727 m)   Wt 192 lb (87.1 kg)   SpO2 98%   BMI 29.19 kg/m    Subjective:    Patient ID: [name], male    DOB: 1941-04-19, 79 y.o.   MRN: [id number]  HPI: [name] is a 79 y.o. male  Chief Complaint  Patient presents with  . Hypertension  . Hyperlipidemia  . Depression   HYPERTENSION / HYPERLIPIDEMIA Satisfied with current treatment? yes Duration of hypertension: chronic BP monitoring frequency: not checking BP medication side effects: no Past BP meds:  Duration of hyperlipidemia: chronic Cholesterol medication side effects: no Cholesterol supplements: none Past cholesterol medications: simvastatin Medication compliance: excellent compliance Aspirin: yes Recent stressors: no Recurrent headaches: no Visual changes: no Palpitations: no Dyspnea: no Chest pain: no Lower extremity edema: no Dizzy/lightheaded: no  IRRITABILITY/DEPRESSION Duration: chronic Status:controlled Anxious mood: no  Excessive worrying: no Irritability: no  Sweating: no Nausea: no Palpitations:no Hyperventilation: no Panic attacks: no Agoraphobia: no  Obscessions/compulsions: no Depressed mood: no Depression screen [facility] 2/9 10/18/2019 01/30/2019 01/24/2018 01/18/2017 04/14/2016  Decreased Interest 0 0 0 0 0  Down, Depressed, Hopeless 0 0 0 0 0  PHQ - 2 Score 0 0 0 0 0  Altered sleeping 0 - - - -  Tired, decreased energy 1 - - - -  Change in appetite 0 - - - -  Feeling bad or failure about yourself  0 - - - -  Trouble concentrating 0 - - - -  Moving slowly or fidgety/restless 0 - - - -  Suicidal thoughts 0 - - - -  PHQ-9 Score 1 - - - -  Difficult doing work/chores Somewhat difficult - - - -   Anhedonia: no Weight changes: no Insomnia: no   Hypersomnia: no Fatigue/loss of energy: no Feelings of worthlessness: no Feelings of guilt: no Impaired concentration/indecisiveness: no Suicidal ideations: no  Crying  spells: no Recent Stressors/Life Changes: no   Relationship problems: no   Family stress: no     Financial stress: no    Job stress: no    Recent death/loss: no  Relevant past medical, surgical, family and social history reviewed and updated as indicated. Interim medical history since our last visit reviewed. Allergies and medications reviewed and updated.  Review of Systems  Constitutional: Negative.   HENT: Negative.   Eyes: Negative.   Respiratory: Negative.   Cardiovascular: Negative.   Gastrointestinal: Negative.   Endocrine: Negative.   Genitourinary: Negative.   Musculoskeletal: Negative.   Skin: Negative.   Allergic/Immunologic: Negative.   Neurological: Negative.   Psychiatric/Behavioral: Negative.     Per HPI unless specifically indicated above     Objective:    BP 136/81   Pulse 65   Temp 98 F (36.7 C)   Ht 5\' 8"  (1.727 m)   Wt 192 lb (87.1 kg)   SpO2 98%   BMI 29.19 kg/m   Wt Readings from Last 3 Encounters:  05/25/20 192 lb (87.1 kg)  01/15/20 185 lb 3 oz (84 kg)  11/26/19 186 lb (84.4 kg)    Physical Exam Vitals and nursing note reviewed.  Constitutional:      General: He is not in acute distress.    Appearance: Normal appearance. He is not ill-appearing, toxic-appearing or diaphoretic.  HENT:     Head: Normocephalic and atraumatic.     Right Ear: External ear  normal.     Left Ear: External ear normal.     Nose: Nose normal.     Mouth/Throat:     Mouth: Mucous membranes are moist.     Pharynx: Oropharynx is clear.  Eyes:     General: No scleral icterus.       Right eye: No discharge.        Left eye: No discharge.     Extraocular Movements: Extraocular movements intact.     Conjunctiva/sclera: Conjunctivae normal.     Pupils: Pupils are equal, round, and reactive to light.  Cardiovascular:     Rate and Rhythm: Normal rate and regular rhythm.     Pulses: Normal pulses.     Heart sounds: Normal heart sounds. No murmur heard. No  friction rub. No gallop.   Pulmonary:     Effort: Pulmonary effort is normal. No respiratory distress.     Breath sounds: Normal breath sounds. No stridor. No wheezing, rhonchi or rales.  Chest:     Chest wall: No tenderness.  Musculoskeletal:        General: Normal range of motion.     Cervical back: Normal range of motion and neck supple.  Skin:    General: Skin is warm and dry.     Capillary Refill: Capillary refill takes less than 2 seconds.     Coloration: Skin is not jaundiced or pale.     Findings: No bruising, erythema, lesion or rash.  Neurological:     General: No focal deficit present.     Mental Status: He is alert and oriented to person, place, and time. Mental status is at baseline.  Psychiatric:        Mood and Affect: Mood normal.        Behavior: Behavior normal.        Thought Content: Thought content normal.        Judgment: Judgment normal.     Results for orders placed or performed in visit on 01/15/20  Pathologist smear review  Result Value Ref Range   Path Review Blood smear is reviewed.       Assessment & Plan:   Problem List Items Addressed This Visit      Digestive   Gastroesophageal reflux disease    Acting up because he's been off is omeprazole for a week. Will restart when he finishes his doxycycline. Call with any concerns.       Relevant Medications   omeprazole (PRILOSEC) 40 MG capsule     Genitourinary   Benign hypertensive renal disease - Primary    Under good control on current regimen. Continue current regimen. Continue to monitor. Call with any concerns. Refills given. Labs drawn today.       Relevant Orders   Comprehensive metabolic panel   Microalbumin, Urine Waived   TSH   Urinalysis, Routine w reflex microscopic     Other   Mixed hyperlipidemia    Under good control on current regimen. Continue current regimen. Continue to monitor. Call with any concerns. Refills given. Labs drawn today.       Relevant Medications    simvastatin (ZOCOR) 40 MG tablet   losartan-hydrochlorothiazide (HYZAAR) 100-25 MG tablet   Other Relevant Orders   Comprehensive metabolic panel   Lipid Panel w/o Chol/HDL Ratio   Anemia    Rechecking labs today. Await results. Treat as needed.       Relevant Orders   Comprehensive metabolic panel   CBC with Differential/Platelet  Irritability    Under good control on current regimen. Continue current regimen. Continue to monitor. Call with any concerns. Refills given.        Relevant Orders   Comprehensive metabolic panel   TSH    Other Visit Diagnoses    Need for hepatitis C screening test       Labs drawn today.   Relevant Orders   Hepatitis C Antibody       Follow up plan: Return in about 6 months (around 11/23/2020).

## 2020-05-25 NOTE — Assessment & Plan Note (Signed)
Under good control on current regimen. Continue current regimen. Continue to monitor. Call with any concerns. Refills given. Labs drawn today.

## 2020-05-25 NOTE — Assessment & Plan Note (Signed)
Under good control on current regimen. Continue current regimen. Continue to monitor. Call with any concerns. Refills given. Labs drawn today.   

## 2020-05-25 NOTE — Assessment & Plan Note (Signed)
Rechecking labs today. Await results. Treat as needed.  °

## 2020-05-25 NOTE — Assessment & Plan Note (Signed)
Acting up because he's been off is omeprazole for a week. Will restart when he finishes his doxycycline. Call with any concerns.

## 2020-05-26 ENCOUNTER — Encounter: Payer: Self-pay | Admitting: Family Medicine

## 2020-05-26 LAB — CBC WITH DIFFERENTIAL/PLATELET
Basophils Absolute: 0 10*3/uL (ref 0.0–0.2)
Basos: 0 %
EOS (ABSOLUTE): 0 10*3/uL (ref 0.0–0.4)
Eos: 1 %
Hematocrit: 28 % — ABNORMAL LOW (ref 37.5–51.0)
Hemoglobin: 9.4 g/dL — ABNORMAL LOW (ref 13.0–17.7)
Lymphocytes Absolute: 0.7 10*3/uL (ref 0.7–3.1)
Lymphs: 21 %
MCH: 30.4 pg (ref 26.6–33.0)
MCHC: 33.6 g/dL (ref 31.5–35.7)
MCV: 91 fL (ref 79–97)
Monocytes Absolute: 0.2 10*3/uL (ref 0.1–0.9)
Monocytes: 8 %
NRBC: 2 % — ABNORMAL HIGH (ref 0–0)
Neutrophils Absolute: 2 10*3/uL (ref 1.4–7.0)
Neutrophils: 64 %
Platelets: 223 10*3/uL (ref 150–450)
RBC: 3.09 x10E6/uL — ABNORMAL LOW (ref 4.14–5.80)
RDW: 15.1 % (ref 11.6–15.4)
WBC: 3.1 10*3/uL — ABNORMAL LOW (ref 3.4–10.8)

## 2020-05-26 LAB — COMPREHENSIVE METABOLIC PANEL
ALT: 21 IU/L (ref 0–44)
AST: 26 IU/L (ref 0–40)
Albumin/Globulin Ratio: 2 (ref 1.2–2.2)
Albumin: 3.8 g/dL (ref 3.7–4.7)
Alkaline Phosphatase: 113 IU/L (ref 44–121)
BUN/Creatinine Ratio: 20 (ref 10–24)
BUN: 28 mg/dL — ABNORMAL HIGH (ref 8–27)
Bilirubin Total: 0.6 mg/dL (ref 0.0–1.2)
CO2: 23 mmol/L (ref 20–29)
Calcium: 9.3 mg/dL (ref 8.6–10.2)
Chloride: 103 mmol/L (ref 96–106)
Creatinine, Ser: 1.4 mg/dL — ABNORMAL HIGH (ref 0.76–1.27)
GFR calc Af Amer: 55 mL/min/{1.73_m2} — ABNORMAL LOW (ref >59)
GFR calc non Af Amer: 47 mL/min/{1.73_m2} — ABNORMAL LOW (ref >59)
Globulin, Total: 1.9 g/dL (ref 1.5–4.5)
Glucose: 98 mg/dL (ref 65–99)
Potassium: 5 mmol/L (ref 3.5–5.2)
Sodium: 139 mmol/L (ref 134–144)
Total Protein: 5.7 g/dL — ABNORMAL LOW (ref 6.0–8.5)

## 2020-05-26 LAB — LIPID PANEL W/O CHOL/HDL RATIO
Cholesterol, Total: 124 mg/dL (ref 100–199)
HDL: 29 mg/dL — ABNORMAL LOW (ref >39)
LDL Chol Calc (NIH): 70 mg/dL (ref 0–99)
Triglycerides: 139 mg/dL (ref 0–149)
VLDL Cholesterol Cal: 25 mg/dL (ref 5–40)

## 2020-05-26 LAB — IMMATURE CELLS
MYELOCYTES: 1 % — ABNORMAL HIGH (ref 0–0)
Metamyelocytes: 5 % — ABNORMAL HIGH (ref 0–0)

## 2020-05-26 LAB — TSH: TSH: 4.02 u[IU]/mL (ref 0.450–4.500)

## 2020-05-26 LAB — HEPATITIS C ANTIBODY: Hep C Virus Ab: <0.1 s/co ratio (ref 0.0–0.9)

## 2020-06-16 ENCOUNTER — Other Ambulatory Visit: Payer: Self-pay | Admitting: Family Medicine

## 2020-06-16 NOTE — Telephone Encounter (Signed)
   Notes to clinic:  medication filled by historical provider Review for refill   Requested Prescriptions  Pending Prescriptions Disp Refills   gabapentin (NEURONTIN) 300 MG capsule [Pharmacy Med Name: GABAPENTIN 300 MG CAP] 270 capsule     Sig: TAKE 1 CAPSULE BY MOUTH 3 TIMES DAILY      Neurology: Anticonvulsants - gabapentin Passed - 06/16/2020 10:09 AM      Passed - Valid encounter within last 12 months    Recent Outpatient Visits           3 weeks ago Benign hypertensive renal disease   [facility] [place], Megan P, DO   1 month ago Need for influenza vaccination   [place], Megan P, DO   6 months ago Benign hypertensive renal disease   [facility] [place], [place], DO   7 months ago Benign hypertensive renal disease   [facility] [place], [place], DO   8 months ago Irritability   [facility] [name], DO       Future Appointments             In 3 months Ralene Bathe, MD [place]   In 5 months Wynetta Emery, Barb Merino, DO [facility]

## 2020-06-23 ENCOUNTER — Telehealth: Payer: Self-pay

## 2020-06-23 NOTE — Telephone Encounter (Signed)
Called pt lvm to make apt from nurse triage after hours, Pt will need Virtual due to [place].

## 2020-06-24 ENCOUNTER — Telehealth (INDEPENDENT_AMBULATORY_CARE_PROVIDER_SITE_OTHER): Payer: Medicare Other | Admitting: Family Medicine

## 2020-06-24 ENCOUNTER — Encounter: Payer: Self-pay | Admitting: Family Medicine

## 2020-06-24 DIAGNOSIS — U071 COVID-19: Secondary | ICD-10-CM

## 2020-06-24 NOTE — Patient Instructions (Signed)
It was great to see you!  Our plans for today:  - See below for self-isolation guidelines. You may end your quarantine once you are 10 days from symptom onset and fever free for 24 hours without use of tylenol or ibuprofen.  - We are referring you for monoclonal antibody treatment. Someone will be contacting you about scheduling this if you qualify. We are experiencing a Producer, television/film/video of this and the oral antivirals as well as a backlog of patients needing treatment so this may cause a delay. - I recommend getting your COVID booster vaccination once you are healed from your current infection. If you get the antibody infusion, you must wait 3 months before vaccination.  - Certainly, if you are having difficulties breathing or unable to keep down fluids, go to the Emergency Department.   Take care and seek immediate care sooner if you develop any concerns.   Dr. Ky Barban     Person Under Monitoring Name: [name]  Location: [place] [address]   Infection Prevention Recommendations for Individuals Confirmed to have, or Being Evaluated for, 2019 Novel Coronavirus (COVID-19) Infection Who Receive Care at Home  Individuals who are confirmed to have, or are being evaluated for, COVID-19 should follow the prevention steps below until a healthcare provider or local or state health department says they can return to normal activities.  Stay home except to get medical care You should restrict activities outside your home, except for getting medical care. Do not go to work, school, or public areas, and do not use public transportation or taxis.  Call ahead before visiting your doctor Before your medical appointment, call the healthcare provider and tell them that you have, or are being evaluated for, COVID-19 infection. This will help the healthcare provider's office take steps to keep other people from getting infected. Ask your healthcare provider to call the local  or state health department.  Monitor your symptoms Seek prompt medical attention if your illness is worsening (e.g., difficulty breathing). Before going to your medical appointment, call the healthcare provider and tell them that you have, or are being evaluated for, COVID-19 infection. Ask your healthcare provider to call the local or state health department.  Wear a facemask You should wear a facemask that covers your nose and mouth when you are in the same room with other people and when you visit a healthcare provider. People who live with or visit you should also wear a facemask while they are in the same room with you.  Separate yourself from other people in your home As much as possible, you should stay in a different room from other people in your home. Also, you should use a separate bathroom, if available.  Avoid sharing household items You should not share dishes, drinking glasses, cups, eating utensils, towels, bedding, or other items with other people in your home. After using these items, you should wash them thoroughly with soap and water.  Cover your coughs and sneezes Cover your mouth and nose with a tissue when you cough or sneeze, or you can cough or sneeze into your sleeve. Throw used tissues in a lined trash can, and immediately wash your hands with soap and water for at least 20 seconds or use an alcohol-based hand rub.  Wash your [facility] your hands often and thoroughly with soap and water for at least 20 seconds. You can use an alcohol-based hand sanitizer if soap and water are not available and if  your hands are not visibly dirty. Avoid touching your eyes, nose, and mouth with unwashed hands.   Prevention Steps for Caregivers and Household Members of Individuals Confirmed to have, or Being Evaluated for, COVID-19 Infection Being Cared for in the Home  If you live with, or provide care at home for, a person confirmed to have, or being evaluated for,  COVID-19 infection please follow these guidelines to prevent infection:  Follow healthcare provider's instructions Make sure that you understand and can help the patient follow any healthcare provider instructions for all care.  Provide for the patient's basic needs You should help the patient with basic needs in the home and provide support for getting groceries, prescriptions, and other personal needs.  Monitor the patient's symptoms If they are getting sicker, call his or her medical provider and tell them that the patient has, or is being evaluated for, COVID-19 infection. This will help the healthcare provider's office take steps to keep other people from getting infected. Ask the healthcare provider to call the local or state health department.  Limit the number of people who have contact with the patient  If possible, have only one caregiver for the patient.  Other household members should stay in another home or place of residence. If this is not possible, they should stay  in another room, or be separated from the patient as much as possible. Use a separate bathroom, if available.  Restrict visitors who do not have an essential need to be in the home.  Keep older adults, very young children, and other sick people away from the patient Keep older adults, very young children, and those who have compromised immune systems or chronic health conditions away from the patient. This includes people with chronic heart, lung, or kidney conditions, diabetes, and cancer.  Ensure good ventilation Make sure that shared spaces in the home have good air flow, such as from an air conditioner or an opened window, weather permitting.  Wash your hands often  Wash your hands often and thoroughly with soap and water for at least 20 seconds. You can use an alcohol based hand sanitizer if soap and water are not available and if your hands are not visibly dirty.  Avoid touching your eyes, nose,  and mouth with unwashed hands.  Use disposable paper towels to dry your hands. If not available, use dedicated cloth towels and replace them when they become wet.  Wear a facemask and gloves  Wear a disposable facemask at all times in the room and gloves when you touch or have contact with the patient's blood, body fluids, and/or secretions or excretions, such as sweat, saliva, sputum, nasal mucus, vomit, urine, or feces.  Ensure the mask fits over your nose and mouth tightly, and do not touch it during use.  Throw out disposable facemasks and gloves after using them. Do not reuse.  Wash your hands immediately after removing your facemask and gloves.  If your personal clothing becomes contaminated, carefully remove clothing and launder. Wash your hands after handling contaminated clothing.  Place all used disposable facemasks, gloves, and other waste in a lined container before disposing them with other household waste.  Remove gloves and wash your hands immediately after handling these items.  Do not share dishes, glasses, or other household items with the patient  Avoid sharing household items. You should not share dishes, drinking glasses, cups, eating utensils, towels, bedding, or other items with a patient who is confirmed to have, or being evaluated  for, COVID-19 infection.  After the person uses these items, you should wash them thoroughly with soap and water.  Wash laundry thoroughly  Immediately remove and wash clothes or bedding that have blood, body fluids, and/or secretions or excretions, such as sweat, saliva, sputum, nasal mucus, vomit, urine, or feces, on them.  Wear gloves when handling laundry from the patient.  Read and follow directions on labels of laundry or clothing items and detergent. In general, wash and dry with the warmest temperatures recommended on the label.  Clean all areas the individual has used often  Clean all touchable surfaces, such as counters,  tabletops, doorknobs, bathroom fixtures, toilets, phones, keyboards, tablets, and bedside tables, every day. Also, clean any surfaces that may have blood, body fluids, and/or secretions or excretions on them.  Wear gloves when cleaning surfaces the patient has come in contact with.  Use a diluted bleach solution (e.g., dilute bleach with 1 part bleach and 10 parts water) or a household disinfectant with a label that says EPA-registered for coronaviruses. To make a bleach solution at home, add 1 tablespoon of bleach to 1 quart (4 cups) of water. For a larger supply, add  cup of bleach to 1 gallon (16 cups) of water.  Read labels of cleaning products and follow recommendations provided on product labels. Labels contain instructions for safe and effective use of the cleaning product including precautions you should take when applying the product, such as wearing gloves or eye protection and making sure you have good ventilation during use of the product.  Remove gloves and wash hands immediately after cleaning.  Monitor yourself for signs and symptoms of illness Caregivers and household members are considered close contacts, should monitor their health, and will be asked to limit movement outside of the home to the extent possible. Follow the monitoring steps for close contacts listed on the symptom monitoring form.   ? If you have additional questions, contact your local health department or call the epidemiologist on call at [phone] (available 24/7). ? This guidance is subject to change. For the most up-to-date guidance from [facility], please refer to their website: [email or web address]

## 2020-06-24 NOTE — Progress Notes (Signed)
Virtual Visit via Video Note  I connected with [name] on 06/24/20 at  1:00 PM EST by a video enabled telemedicine application and verified that I am speaking with the correct person using two identifiers.  Location: Patient: home Provider: [facility]   I discussed the limitations of evaluation and management by telemedicine and the availability of in person appointments. The patient expressed understanding and agreed to proceed.  History of Present Illness:  UPPER RESPIRATORY TRACT INFECTION - COVID+ 06/19/20 - symptom onset 06/18/20 - has received 2 doses of Pfizer vaccination.  Worst symptom: congestion, cough Fever: previously Cough: yes, nonproductive Shortness of breath: no Wheezing: no Chest pain: no Chest tightness: no Chest congestion: yes Nasal congestion: yes Runny nose: yes Sneezing: no Sore throat: no Sinus pressure: no Headache: no Ear pain: yes bilateral Ear pressure: yes bilateral  Vomiting: no Context: better Recurrent sinusitis: no Relief with OTC cold/cough medications: yes  Treatments attempted: cold/sinus and mucinex     Observations/Objective:  Patient had trouble connecting to video visit, entirety of visit conducted over the phone.  Speaks in full sentences, no respiratory distress.   Assessment and Plan:  COVID-19 With mild symptoms. Is a candidate for [facility] referral, referral placed. Discussed self-quarantine, OTC symptom relief, and emergency precautions.     I discussed the assessment and treatment plan with the patient. The patient was provided an opportunity to ask questions and all were answered. The patient agreed with the plan and demonstrated an understanding of the instructions.   The patient was advised to call back or seek an in-person evaluation if the symptoms worsen or if the condition fails to improve as anticipated.  I provided 11 minutes of non-face-to-face time during this encounter.   Myles Gip, DO

## 2020-07-10 ENCOUNTER — Ambulatory Visit (INDEPENDENT_AMBULATORY_CARE_PROVIDER_SITE_OTHER): Payer: Medicare Other

## 2020-07-10 VITALS — Ht 70.0 in | Wt 190.0 lb

## 2020-07-10 DIAGNOSIS — Z0000 Encounter for general adult medical examination without abnormal findings: Secondary | ICD-10-CM | POA: Diagnosis not present

## 2020-07-10 NOTE — Progress Notes (Signed)
I connected with [name] today by telephone and verified that I am speaking with the correct person using two identifiers. Location patient: home Location provider: work Persons participating in the virtual visit: [name], Glenna Durand LPN.   I discussed the limitations, risks, security and privacy concerns of performing an evaluation and management service by telephone and the availability of in person appointments. I also discussed with the patient that there may be a patient responsible charge related to this service. The patient expressed understanding and verbally consented to this telephonic visit.    Interactive audio and video telecommunications were attempted between this provider and patient, however failed, due to patient having technical difficulties OR patient did not have access to video capability.  We continued and completed visit with audio only.     Vital signs may be patient reported or missing.  Subjective:   [name] is a 80 y.o. male who presents for Medicare Annual/Subsequent preventive examination.  Review of Systems     Cardiac Risk Factors include: advanced age (>17men, >50 women);dyslipidemia;sedentary lifestyle     Objective:    Today's Vitals   07/10/20 0857  Weight: 190 lb (86.2 kg)  Height: 5\' 10"  (1.778 m)   Body mass index is 27.26 kg/m.  Advanced Directives 07/10/2020 01/30/2019 01/14/2019 07/04/2018 01/24/2018 01/03/2018 07/05/2017  Does Patient Have a Medical Advance Directive? No No No No No No No  Would patient like information on creating a medical advance directive? - - No - Patient declined No - Patient declined Yes (MAU/Ambulatory/Procedural Areas - Information given) - -  Pre-existing out of facility DNR order (yellow form or pink MOST form) - - - - - - -    Current Medications (verified) Outpatient Encounter Medications as of 07/10/2020  Medication Sig  . calcium carbonate (OSCAL) 1500 (600 Ca) MG TABS tablet Take by mouth.  .  Cholecalciferol (D3-1000 PO) Take by mouth.  . clopidogrel (PLAVIX) 75 MG tablet Take 1 tablet (75 mg total) by mouth daily.  . diclofenac sodium (VOLTAREN) 1 % GEL Apply 4 g topically 4 (four) times daily as needed (for neck pain).  . DULoxetine (CYMBALTA) 20 MG capsule TAKE 1 CAPSULE BY MOUTH EVERY DAY  . gabapentin (NEURONTIN) 300 MG capsule TAKE 1 CAPSULE BY MOUTH 3 TIMES DAILY  . Ginsengs-Saw Palmetto (MULTI GINSENG & SAW PALMETTO) 500 MG CAPS Take 500 mg by mouth daily.  Marland Kitchen losartan-hydrochlorothiazide (HYZAAR) 100-25 MG tablet Take 1 tablet by mouth daily.  . meclizine (ANTIVERT) 25 MG tablet TAKE 1 TABLET BY MOUTH 3 TIMES DAILY AS NEEDED FOR DIZZINESS  . Multiple Vitamin (MULITIVITAMIN WITH MINERALS) TABS Take 1 tablet by mouth daily.  . Naproxen Sodium 220 MG CAPS Take by mouth as needed.   Marland Kitchen omeprazole (PRILOSEC) 40 MG capsule TAKE 1 CAPSULE (40 MG) BY MOUTH EVERY DAY  . simvastatin (ZOCOR) 40 MG tablet Take 1 tablet (40 mg total) by mouth daily.  . tamsulosin (FLOMAX) 0.4 MG CAPS capsule Take 1 capsule (0.4 mg total) by mouth daily.  Marland Kitchen zinc gluconate 50 MG tablet Take by mouth.  Marland Kitchen azelastine (ASTELIN) 0.1 % nasal spray Place 1 spray into both nostrils 2 (two) times daily. Use in each nostril as directed (Patient not taking: No sig reported)  . mupirocin ointment (BACTROBAN) 2 % Apply 1 application topically daily. With dressing changes (Patient not taking: Reported on 07/10/2020)  . tadalafil (CIALIS) 20 MG tablet Take 0.5-1 tablets (10-20 mg total) by mouth every other  day as needed for erectile dysfunction. (Patient not taking: No sig reported)   No facility-administered encounter medications on file as of 07/10/2020.    Allergies (verified) Meloxicam   History: Past Medical History:  Diagnosis Date  . Arthritis   . Benign hypertensive renal disease   . GERD (gastroesophageal reflux disease)   . Heartburn   . History of retinal detachment   . Hyperlipidemia   . Hypertension    . Melanoma ([place])    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 approximately 10-15 years ago  . Myelofibrosis ([place])   . Prostate hypertrophy   . Stroke [facility]) June 2009   R brain subcortical infarct   Past Surgical History:  Procedure Laterality Date  . ASPIRATION / INJECTION RENAL CYST  Feb 2017  . BACK SURGERY     approx 20- 25 years ago  . GAS INSERTION  08/11/2011   Procedure: INSERTION OF GAS;  Surgeon: Hayden Pedro, MD;  Location: [place];  Service: Ophthalmology;  Laterality: Right;  [id number]  . SCLERAL BUCKLE  08/11/2011   Procedure: SCLERAL BUCKLE;  Surgeon: Hayden Pedro, MD;  Location: [place];  Service: Ophthalmology;  Laterality: Right;  Marland Kitchen VARICOSE VEIN SURGERY     Family History  Problem Relation Age of Onset  . Heart disease Father   . Diabetes Son   . Kidney disease Neg Hx   . Prostate cancer Neg Hx    Social History   Socioeconomic History  . Marital status: Married    Spouse name: Parke Simmers   . Number of children: 2  . Years of education: 12+  . Highest education level: Some college, no degree  Occupational History  . 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 Occupation: SELF EMPLOYED    Employer: SELF EMPLOYED  Tobacco Use  . Smoking status: Former Smoker    Packs/day: 0.25    Types: Cigarettes    Quit date: 06/06/2010    Years since quitting: 10.1  . Smokeless tobacco: Never Used  Vaping Use  . Vaping Use: Never used  Substance and Sexual Activity  . Alcohol use: No    Alcohol/week: 0.0 standard drinks  . Drug use: No  . Sexual activity: Not on file  Other Topics Concern  . Not on file  Social History Narrative   Pt lives at home with his family.   Caffeine Use- 2 cups daily   Patient has 2 children.    Patient has some college.    Patient is right handed.          Works full time   Scientist, physiological Strain: Low Risk   . Difficulty of Paying Living Expenses: Not hard at all  Food Insecurity:  No Food Insecurity  . Worried About Charity fundraiser in the Last Year: Never true  . Ran Out of Food in the Last Year: Never true  Transportation Needs: No Transportation Needs  . Lack of Transportation (Medical): No  . Lack of Transportation (Non-Medical): No  Physical Activity: Inactive  . Days of Exercise per Week: 0 days  . Minutes of Exercise per Session: 0 min  Stress: No Stress Concern Present  . Feeling of Stress : Not at all  Social Connections: Not on file    Tobacco Counseling Counseling given: Not Answered   Clinical Intake:  Pre-visit preparation completed: Yes  Pain : No/denies pain  Nutritional Status: BMI 25 -29 Overweight Nutritional Risks: None Diabetes: No  How often do you need to have someone help you when you read instructions, pamphlets, or other written materials from your doctor or pharmacy?: 1 - Never What is the last grade level you completed in school?: some college  Diabetic? no  Interpreter Needed?: No  Information entered by :: NAllen LPN   Activities of Daily Living In your present state of health, do you have any difficulty performing the following activities: 07/10/2020 05/25/2020  Hearing? Tempie Donning  Comment hearing aides -  Vision? N N  Difficulty concentrating or making decisions? N N  Walking or climbing stairs? N N  Dressing or bathing? N N  Doing errands, shopping? N N  Preparing Food and eating ? N -  Using the Toilet? N -  In the past six months, have you accidently leaked urine? N -  Do you have problems with loss of bowel control? N -  Managing your Medications? Y -  Comment wife manages -  Managing your Finances? N -  Housekeeping or managing your Housekeeping? N -  Some recent data might be hidden    Patient Care Team: [name], DO as PCP - General (Family Medicine) Rockey Situ Kathlene November, MD as PCP - Cardiology (Cardiology) Pieter Partridge, MD as Referring Physician (Hematology) Lequita Asal,  MD as Referring Physician (Hematology and Oncology) Hayden Pedro, MD as Consulting Physician (Ophthalmology) Garvin Fila, MD as Consulting Physician (Neurology) Festus Aloe, MD as Consulting Physician (Urology) Minna Merritts, MD as Consulting Physician (Cardiology) Abbie Sons, MD (Urology)  Indicate any recent Medical Services you may have received from other than [facility] providers in the past year (date may be approximate).     Assessment:   This is a routine wellness examination for [place].  Hearing/Vision screen  Hearing Screening   125Hz  250Hz  500Hz  1000Hz  2000Hz  3000Hz  4000Hz  6000Hz  8000Hz   Right ear:           Left ear:           Vision Screening Comments: Regular eye exams, [facility]  Dietary issues and exercise activities discussed: Current Exercise Habits: The patient does not participate in regular exercise at present  Goals    . DIET - INCREASE WATER INTAKE     Recommend drinking at least 6-8 glasses of water a day     . Patient Stated     07/10/2020, no goals      Depression Screen PHQ 2/9 Scores 07/10/2020 10/18/2019 01/30/2019 01/24/2018 01/18/2017 04/14/2016  PHQ - 2 Score 0 0 0 0 0 0  PHQ- 9 Score - 1 - - - -    Fall Risk Fall Risk  07/10/2020 05/25/2020 01/30/2019 09/03/2018 01/24/2018  Falls in the past year? 0 0 0 0 No  Number falls in past yr: 0 0 - 0 -  Injury with Fall? - 0 - 0 -  Risk for fall due to : Medication side effect No Fall Risks - - -  Follow up Falls evaluation completed;Education provided;Falls prevention discussed Falls evaluation completed - Falls evaluation completed -    FALL RISK PREVENTION PERTAINING TO THE HOME:  Any stairs in or around the home? Yes  If so, are there any without handrails? No  Home free of loose throw rugs in walkways, pet beds, electrical cords, etc? Yes  Adequate lighting in your home to reduce risk of falls? Yes   ASSISTIVE DEVICES  UTILIZED TO PREVENT FALLS:  Life alert? No  Use of  a cane, walker or w/c? No  Grab bars in the bathroom? Yes  Shower chair or bench in shower? Yes  Elevated toilet seat or a handicapped toilet? Yes   TIMED UP AND GO:  Was the test performed? No   Cognitive Function:     6CIT Screen 07/10/2020 01/24/2018 01/18/2017  What Year? 0 points 0 points 0 points  What month? 0 points 0 points 0 points  What time? 0 points 0 points 0 points  Count back from 20 0 points 0 points 0 points  Months in reverse 2 points 0 points 0 points  Repeat phrase 0 points 0 points 2 points  Total Score 2 0 2    Immunizations Immunization History  Administered Date(s) Administered  . Fluad Quad(high Dose 65+) 03/28/2019, 05/05/2020  . Influenza, High Dose Seasonal PF 02/09/2017, 03/13/2018  . Influenza,inj,Quad PF,6+ Mos 03/03/2015  . Influenza-Unspecified 03/07/2016  . PFIZER(Purple Top)SARS-COV-2 Vaccination 08/01/2019, 08/22/2019  . Pneumococcal Conjugate-13 07/08/2015  . Pneumococcal Polysaccharide-23 08/12/2011  . Td 10/11/2016  . Zoster 06/07/2011    TDAP status: Up to date  Flu Vaccine status: Up to date  Pneumococcal vaccine status: Up to date  Covid-19 vaccine status: Completed vaccines  Qualifies for Shingles Vaccine? Yes   Zostavax completed Yes   Shingrix Completed?: No.    Education has been provided regarding the importance of this vaccine. Patient has been advised to call insurance company to determine out of pocket expense if they have not yet received this vaccine. Advised may also receive vaccine at local pharmacy or Health Dept. Verbalized acceptance and understanding.  Screening Tests Health Maintenance  Topic Date Due  . COVID-19 Vaccine (3 - Pfizer risk 4-dose series) 09/19/2019  . TETANUS/TDAP  10/12/2026  . INFLUENZA VACCINE  Completed  . Hepatitis C Screening  Completed  . PNA vac Low Risk Adult  Completed    Health Maintenance  Health Maintenance Due  Topic Date Due  . COVID-19 Vaccine (3 - Pfizer risk 4-dose  series) 09/19/2019    Colorectal cancer screening: No longer required.   Lung Cancer Screening: (Low Dose CT Chest recommended if Age 22-80 years, 30 pack-year currently smoking OR have quit w/in 15years.) does not qualify.   Lung Cancer Screening Referral: no  Additional Screening:  Hepatitis C Screening: does qualify; Completed 05/25/2020  Vision Screening: Recommended annual ophthalmology exams for early detection of glaucoma and other disorders of the eye. Is the patient up to date with their annual eye exam?  Yes  Who is the provider or what is the name of the office in which the patient attends annual eye exams? [facility] If pt is not established with a provider, would they like to be referred to a provider to establish care? No .   Dental Screening: Recommended annual dental exams for proper oral hygiene  Community Resource Referral / Chronic Care Management: CRR required this visit?  No   CCM required this visit?  No      Plan:     I have personally reviewed and noted the following in the patient's chart:   . Medical and social history . Use of alcohol, tobacco or illicit drugs  . Current medications and supplements . Functional ability and status . Nutritional status . Physical activity . Advanced directives . List of other physicians . Hospitalizations, surgeries, and ER visits in previous 12 months . Vitals . Screenings to include cognitive,  depression, and falls . Referrals and appointments  In addition, I have reviewed and discussed with patient certain preventive protocols, quality metrics, and best practice recommendations. A written personalized care plan for preventive services as well as general preventive health recommendations were provided to patient.     Kellie Simmering, LPN   06/09/863   Nurse Notes:

## 2020-07-10 NOTE — Patient Instructions (Signed)
[name] , Thank you for taking time to come for your Medicare Wellness Visit. I appreciate your ongoing commitment to your health goals. Please review the following plan we discussed and let me know if I can assist you in the future.   Screening recommendations/referrals: Colonoscopy: not required Recommended yearly ophthalmology/optometry visit for glaucoma screening and checkup Recommended yearly dental visit for hygiene and checkup  Vaccinations: Influenza vaccine: completed 05/05/2020, due 01/04/2021 Pneumococcal vaccine: completed 07/08/2015 Tdap vaccine: completed 10/11/2016, due 10/12/2026 Shingles vaccine: discussed   Covid-19:  08/22/2019, 08/01/2019  Advanced directives: Advance directive discussed with you today.   Conditions/risks identified: none  Next appointment: Follow up in one year for your annual wellness visit.   Preventive Care 18 Years and Older, Male Preventive care refers to lifestyle choices and visits with your health care provider that can promote health and wellness. What does preventive care include?  A yearly physical exam. This is also called an annual well check.  Dental exams once or twice a year.  Routine eye exams. Ask your health care provider how often you should have your eyes checked.  Personal lifestyle choices, including:  Daily care of your teeth and gums.  Regular physical activity.  Eating a healthy diet.  Avoiding tobacco and drug use.  Limiting alcohol use.  Practicing safe sex.  Taking low doses of aspirin every day.  Taking vitamin and mineral supplements as recommended by your health care provider. What happens during an annual well check? The services and screenings done by your health care provider during your annual well check will depend on your age, overall health, lifestyle risk factors, and family history of disease. Counseling  Your health care provider may ask you questions about your:  Alcohol use.  Tobacco  use.  Drug use.  Emotional well-being.  Home and relationship well-being.  Sexual activity.  Eating habits.  History of falls.  Memory and ability to understand (cognition).  Work and work Statistician. Screening  You may have the following tests or measurements:  Height, weight, and BMI.  Blood pressure.  Lipid and cholesterol levels. These may be checked every 5 years, or more frequently if you are over 36 years old.  Skin check.  Lung cancer screening. You may have this screening every year starting at age 60 if you have a 30-pack-year history of smoking and currently smoke or have quit within the past 15 years.  Fecal occult blood test (FOBT) of the stool. You may have this test every year starting at age 63.  Flexible sigmoidoscopy or colonoscopy. You may have a sigmoidoscopy every 5 years or a colonoscopy every 10 years starting at age 58.  Prostate cancer screening. Recommendations will vary depending on your family history and other risks.  Hepatitis C blood test.  Hepatitis B blood test.  Sexually transmitted disease (STD) testing.  Diabetes screening. This is done by checking your blood sugar (glucose) after you have not eaten for a while (fasting). You may have this done every 1-3 years.  Abdominal aortic aneurysm (AAA) screening. You may need this if you are a current or former smoker.  Osteoporosis. You may be screened starting at age 73 if you are at high risk. Talk with your health care provider about your test results, treatment options, and if necessary, the need for more tests. Vaccines  Your health care provider may recommend certain vaccines, such as:  Influenza vaccine. This is recommended every year.  Tetanus, diphtheria, and acellular pertussis (Tdap, Td)  vaccine. You may need a Td booster every 10 years.  Zoster vaccine. You may need this after age 15.  Pneumococcal 13-valent conjugate (PCV13) vaccine. One dose is recommended after age  65.  Pneumococcal polysaccharide (PPSV23) vaccine. One dose is recommended after age 48. Talk to your health care provider about which screenings and vaccines you need and how often you need them. This information is not intended to replace advice given to you by your health care provider. Make sure you discuss any questions you have with your health care provider. Document Released: 06/19/2015 Document Revised: 02/10/2016 Document Reviewed: 03/24/2015 Elsevier Interactive Patient Education  2017 [place] Prevention in the Home Falls can cause injuries. They can happen to people of all ages. There are many things you can do to make your home safe and to help prevent falls. What can I do on the outside of my home?  Regularly fix the edges of walkways and driveways and fix any cracks.  Remove anything that might make you trip as you walk through a door, such as a raised step or threshold.  Trim any bushes or trees on the path to your home.  Use bright outdoor lighting.  Clear any walking paths of anything that might make someone trip, such as rocks or tools.  Regularly check to see if handrails are loose or broken. Make sure that both sides of any steps have handrails.  Any raised decks and porches should have guardrails on the edges.  Have any leaves, snow, or ice cleared regularly.  Use sand or salt on walking paths during winter.  Clean up any spills in your garage right away. This includes oil or grease spills. What can I do in the bathroom?  Use night lights.  Install grab bars by the toilet and in the tub and shower. Do not use towel bars as grab bars.  Use non-skid mats or decals in the tub or shower.  If you need to sit down in the shower, use a plastic, non-slip stool.  Keep the floor dry. Clean up any water that spills on the floor as soon as it happens.  Remove soap buildup in the tub or shower regularly.  Attach bath mats securely with double-sided  non-slip rug tape.  Do not have throw rugs and other things on the floor that can make you trip. What can I do in the bedroom?  Use night lights.  Make sure that you have a light by your bed that is easy to reach.  Do not use any sheets or blankets that are too big for your bed. They should not hang down onto the floor.  Have a firm chair that has side arms. You can use this for support while you get dressed.  Do not have throw rugs and other things on the floor that can make you trip. What can I do in the kitchen?  Clean up any spills right away.  Avoid walking on wet floors.  Keep items that you use a lot in easy-to-reach places.  If you need to reach something above you, use a strong step stool that has a grab bar.  Keep electrical cords out of the way.  Do not use floor polish or wax that makes floors slippery. If you must use wax, use non-skid floor wax.  Do not have throw rugs and other things on the floor that can make you trip. What can I do with my stairs?  Do not leave  any items on the stairs.  Make sure that there are handrails on both sides of the stairs and use them. Fix handrails that are broken or loose. Make sure that handrails are as long as the stairways.  Check any carpeting to make sure that it is firmly attached to the stairs. Fix any carpet that is loose or worn.  Avoid having throw rugs at the top or bottom of the stairs. If you do have throw rugs, attach them to the floor with carpet tape.  Make sure that you have a light switch at the top of the stairs and the bottom of the stairs. If you do not have them, ask someone to add them for you. What else can I do to help prevent falls?  Wear shoes that:  Do not have high heels.  Have rubber bottoms.  Are comfortable and fit you well.  Are closed at the toe. Do not wear sandals.  If you use a stepladder:  Make sure that it is fully opened. Do not climb a closed stepladder.  Make sure that both  sides of the stepladder are locked into place.  Ask someone to hold it for you, if possible.  Clearly mark and make sure that you can see:  Any grab bars or handrails.  First and last steps.  Where the edge of each step is.  Use tools that help you move around (mobility aids) if they are needed. These include:  Canes.  Walkers.  Scooters.  Crutches.  Turn on the lights when you go into a dark area. Replace any light bulbs as soon as they burn out.  Set up your furniture so you have a clear path. Avoid moving your furniture around.  If any of your floors are uneven, fix them.  If there are any pets around you, be aware of where they are.  Review your medicines with your doctor. Some medicines can make you feel dizzy. This can increase your chance of falling. Ask your doctor what other things that you can do to help prevent falls. This information is not intended to replace advice given to you by your health care provider. Make sure you discuss any questions you have with your health care provider. Document Released: 03/19/2009 Document Revised: 10/29/2015 Document Reviewed: 06/27/2014 Elsevier Interactive Patient Education  2017 [employer].

## 2020-07-20 DIAGNOSIS — M9903 Segmental and somatic dysfunction of lumbar region: Secondary | ICD-10-CM | POA: Diagnosis not present

## 2020-07-20 DIAGNOSIS — M955 Acquired deformity of pelvis: Secondary | ICD-10-CM | POA: Diagnosis not present

## 2020-07-20 DIAGNOSIS — M5136 Other intervertebral disc degeneration, lumbar region: Secondary | ICD-10-CM | POA: Diagnosis not present

## 2020-07-20 DIAGNOSIS — M9905 Segmental and somatic dysfunction of pelvic region: Secondary | ICD-10-CM | POA: Diagnosis not present

## 2020-07-21 NOTE — Progress Notes (Signed)
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   HPI:  The patient was last seen in the medical oncology clinic on 01/15/2020. At that time, he felt good.  Exam was stable. Hematocrit was 30.0, hemoglobin 9.6, MCV 95.2, platelets 254,000, WBC 2,900 (ANC 1,300). Creatinine was 1.42 (CrCl 47 ml/min). Calcium was 8.2. Total protein was 6.4. LDH was 426.  Pathologist smear review revealed absolute leukopenia, with rare circulating blasts, approximately 1 to 2% by manual differential. Absolute neutrophil count was 1520 per microliter. There was normocytic anemia with mild anisopoikilocytosis, including few teardrop cells, polychromasia, and few nucleated RBCs. Platelet count and morphology were normal. Findings were compatible with mild leukoerythroblastic smear, consistent with patient history. There was no evidence of progression of disease, though it should be noted that peripheral blood morphology may not accurately represent underlying marrow status.  The patient tested positive for COVID-19 on 06/19/2020. He had a bad sore throat for 3 days.  During the interim, he has been good. He has arthritis in both knees. He has not had any issues with his prostate.  He denies early satiety and blood in the stool. His stools were black around 3 weeks ago but then went back to normal. He does not know when his last colonoscopy was but he states the he has diverticulitis.   Past Medical History:  Diagnosis Date  . Arthritis   . Benign hypertensive renal disease   . GERD (gastroesophageal reflux disease)   . Heartburn   . History of retinal detachment   . Hyperlipidemia   . Hypertension   . Melanoma ([place])    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  approximately 10-15 years ago  . Myelofibrosis ([place])   . Prostate hypertrophy   . Stroke [facility]) June 2009   R brain subcortical infarct    Past Surgical History:  Procedure Laterality Date  . ASPIRATION / INJECTION RENAL CYST  Feb 2017  . BACK SURGERY     approx 20- 25 years ago  . GAS INSERTION  08/11/2011   Procedure: INSERTION OF GAS;  Surgeon: Hayden Pedro, MD;  Location: [facility];  Service: Ophthalmology;  Laterality: Right;  [id number]  . SCLERAL BUCKLE  08/11/2011   Procedure: SCLERAL BUCKLE;  Surgeon: Hayden Pedro, MD;  Location: [facility];  Service: Ophthalmology;  Laterality: Right;  Marland Kitchen VARICOSE VEIN SURGERY      Family History  Problem Relation Age of Onset  . Heart disease Father   . Diabetes Son   . Kidney disease Neg Hx   . Prostate cancer Neg Hx     Social History:  reports that he quit smoking about 10 years ago. His smoking use included cigarettes. He smoked 0.25 packs per day. He has never used smokeless tobacco. He reports that he does not drink alcohol and does not use drugs. 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 He lives in [place]. The patient is alone today.  Allergies:  Allergies  Allergen Reactions  . Meloxicam Nausea And Vomiting    Current Medications: Current Outpatient Medications  Medication Sig Dispense Refill  . calcium carbonate (OSCAL) 1500 (600 Ca) MG TABS tablet Take by mouth.    Marland Kitchen  Cholecalciferol (D3-1000 PO) Take by mouth daily.    . clopidogrel (PLAVIX) 75 MG tablet Take 1 tablet (75 mg total) by mouth daily. 90 tablet 1  . diclofenac sodium (VOLTAREN) 1 % GEL Apply 4 g topically 4 (four) times daily as needed (for neck pain). 100 g 6  . DULoxetine (CYMBALTA) 20 MG capsule TAKE 1 CAPSULE BY MOUTH EVERY DAY 90 capsule 0  . gabapentin (NEURONTIN) 300 MG capsule TAKE 1 CAPSULE BY MOUTH 3 TIMES DAILY 270 capsule 1  . Ginsengs-Saw Palmetto (MULTI GINSENG & SAW PALMETTO) 500 MG CAPS Take 500 mg by mouth  daily.    Marland Kitchen losartan-hydrochlorothiazide (HYZAAR) 100-25 MG tablet Take 1 tablet by mouth daily. 90 tablet 1  . meclizine (ANTIVERT) 25 MG tablet TAKE 1 TABLET BY MOUTH 3 TIMES DAILY AS NEEDED FOR DIZZINESS 90 tablet 0  . Multiple Vitamin (MULITIVITAMIN WITH MINERALS) TABS Take 1 tablet by mouth daily.    . Naproxen Sodium 220 MG CAPS Take by mouth as needed.     Marland Kitchen omeprazole (PRILOSEC) 40 MG capsule TAKE 1 CAPSULE (40 MG) BY MOUTH EVERY DAY 90 capsule 1  . simvastatin (ZOCOR) 40 MG tablet Take 1 tablet (40 mg total) by mouth daily. 90 tablet 1  . tamsulosin (FLOMAX) 0.4 MG CAPS capsule Take 1 capsule (0.4 mg total) by mouth daily. 90 capsule 1  . zinc gluconate 50 MG tablet Take by mouth.    Marland Kitchen azelastine (ASTELIN) 0.1 % nasal spray Place 1 spray into both nostrils 2 (two) times daily. Use in each nostril as directed (Patient not taking: No sig reported) 30 mL 0  . mupirocin ointment (BACTROBAN) 2 % Apply 1 application topically daily. With dressing changes (Patient not taking: No sig reported) 22 g 0  . tadalafil (CIALIS) 20 MG tablet Take 0.5-1 tablets (10-20 mg total) by mouth every other day as needed for erectile dysfunction. (Patient not taking: No sig reported) 5 tablet 11   No current facility-administered medications for this visit.    Review of Systems  Constitutional: Negative for chills, diaphoresis, fever, malaise/fatigue and weight loss (up 8 lbs).  HENT: Positive for hearing loss. Negative for congestion, ear discharge, ear pain, nosebleeds, sinus pain, sore throat and tinnitus.   Eyes: Negative.  Negative for blurred vision, double vision and photophobia.  Respiratory: Negative.  Negative for cough, hemoptysis, sputum production and shortness of breath.   Cardiovascular: Negative.  Negative for chest pain, palpitations and leg swelling.  Gastrointestinal: Positive for melena (3 weeks ago, resolved). Negative for abdominal pain, blood in stool, constipation, diarrhea, heartburn,  nausea and vomiting.       Denies early satiety.  Genitourinary: Negative for dysuria, frequency, hematuria and urgency.       BPH.  Musculoskeletal: Positive for joint pain (arthritis in knees). Negative for back pain, myalgias and neck pain.  Skin: Negative.  Negative for itching and rash.  Neurological: Negative.  Negative for dizziness, tingling, sensory change, weakness and headaches.  Endo/Heme/Allergies: Negative.  Does not bruise/bleed easily.  Psychiatric/Behavioral: Negative.  Negative for depression, memory loss and substance abuse. The patient is not nervous/anxious and does not have insomnia.   All other systems reviewed and are negative.  Performance status (ECOG):  0  Vitals Blood pressure 126/61, pulse 72, temperature 98.1 F (36.7 C), temperature source Tympanic, resp. rate 16, weight 193 lb 7.3 oz (87.8 kg), SpO2 98 %.   Physical Exam Nursing note reviewed.  Constitutional:      General:  He is not in acute distress.    Appearance: He is well-developed. He is not diaphoretic.  HENT:     Head: Normocephalic and atraumatic.     Mouth/Throat:     Mouth: Mucous membranes are moist.     Pharynx: Oropharynx is clear.  Eyes:     General: No scleral icterus.    Extraocular Movements: Extraocular movements intact.     Conjunctiva/sclera: Conjunctivae normal.     Pupils: Pupils are equal, round, and reactive to light.     Comments: Blue eyes.   Cardiovascular:     Rate and Rhythm: Normal rate and regular rhythm.     Heart sounds: Normal heart sounds. No murmur heard.   Pulmonary:     Effort: Pulmonary effort is normal. No respiratory distress.     Breath sounds: Normal breath sounds. No wheezing or rales.  Chest:     Chest wall: No tenderness.  Breasts:     Right: No supraclavicular adenopathy.     Left: No supraclavicular adenopathy.    Abdominal:     General: Bowel sounds are normal. There is no distension.     Palpations: Abdomen is soft. There is no mass.      Tenderness: There is no abdominal tenderness. There is no guarding or rebound.  Musculoskeletal:        General: No swelling or tenderness. Normal range of motion.     Cervical back: Normal range of motion and neck supple.  Lymphadenopathy:     Head:     Right side of head: No preauricular, posterior auricular or occipital adenopathy.     Left side of head: No preauricular, posterior auricular or occipital adenopathy.     Cervical: No cervical adenopathy.     Upper Body:     Right upper body: No supraclavicular adenopathy.     Left upper body: No supraclavicular adenopathy.     Lower Body: No right inguinal adenopathy. No left inguinal adenopathy.  Skin:    General: Skin is warm and dry.  Neurological:     Mental Status: He is alert and oriented to person, place, and time.  Psychiatric:        Behavior: Behavior normal.        Thought Content: Thought content normal.        Judgment: Judgment normal.    Appointment on 07/22/2020  Component Date Value Ref Range Status  . LDH 07/22/2020 372* 98 - 192 U/L Final   Performed at [facility], [address], [place], [place] [address]  . WBC 07/22/2020 2.3* 4.0 - 10.5 K/uL Final   WHITE COUNT CONFIRMED ON SMEAR  . RBC 07/22/2020 2.69* 4.22 - 5.81 MIL/uL Final  . Hemoglobin 07/22/2020 8.0* 13.0 - 17.0 g/dL Final  . HCT 07/22/2020 25.5* 39.0 - 52.0 % Final  . MCV 07/22/2020 94.8  80.0 - 100.0 fL Final  . MCH 07/22/2020 29.7  26.0 - 34.0 pg Final  . MCHC 07/22/2020 31.4  30.0 - 36.0 g/dL Final  . RDW 07/22/2020 17.5* 11.5 - 15.5 % Final  . Platelets 07/22/2020 213  150 - 400 K/uL Final  . nRBC 07/22/2020 3.5* 0.0 - 0.2 % Final  . Neutrophils Relative % 07/22/2020 52  % Final  . Neutro Abs 07/22/2020 1.2* 1.7 - 7.7 K/uL Final  . Lymphocytes Relative 07/22/2020 25  % Final  . Lymphs Abs 07/22/2020 0.6* 0.7 - 4.0 K/uL Final  . Monocytes Relative 07/22/2020 14  %  Final  . Monocytes Absolute 07/22/2020 0.3  0.1 - 1.0  K/uL Final  . Eosinophils Relative 07/22/2020 0  % Final  . Eosinophils Absolute 07/22/2020 0.0  0.0 - 0.5 K/uL Final  . Basophils Relative 07/22/2020 0  % Final  . Basophils Absolute 07/22/2020 0.0  0.0 - 0.1 K/uL Final  . WBC Morphology 07/22/2020 MILD LEFT SHIFT (1-5% METAS, OCC MYELO, OCC BANDS)   Final  . Smear Review 07/22/2020 Normal platelet morphology   Final  . Immature Granulocytes 07/22/2020 9  % Final  . Abs Immature Granulocytes 07/22/2020 0.20* 0.00 - 0.07 K/uL Final  . Tear Drop Cells 07/22/2020 PRESENT   Final  . Polychromasia 07/22/2020 PRESENT   Final  . Ovalocytes 07/22/2020 PRESENT   Final   Performed at [facility], [address], [place], [place] [address]  . Sodium 07/22/2020 141  135 - 145 mmol/L Final  . Potassium 07/22/2020 4.1  3.5 - 5.1 mmol/L Final  . Chloride 07/22/2020 105  98 - 111 mmol/L Final  . CO2 07/22/2020 24  22 - 32 mmol/L Final  . Glucose, Bld 07/22/2020 147* 70 - 99 mg/dL Final   Glucose reference range applies only to samples taken after fasting for at least 8 hours.  . BUN 07/22/2020 23  8 - 23 mg/dL Final  . Creatinine, Ser 07/22/2020 1.36* 0.61 - 1.24 mg/dL Final  . Calcium 07/22/2020 8.4* 8.9 - 10.3 mg/dL Final  . Total Protein 07/22/2020 6.4* 6.5 - 8.1 g/dL Final  . Albumin 07/22/2020 3.6  3.5 - 5.0 g/dL Final  . AST 07/22/2020 22  15 - 41 U/L Final  . ALT 07/22/2020 17  0 - 44 U/L Final  . Alkaline Phosphatase 07/22/2020 104  38 - 126 U/L Final  . Total Bilirubin 07/22/2020 0.9  0.3 - 1.2 mg/dL Final  . GFR, Estimated 07/22/2020 53* >60 mL/min Final   Comment: (NOTE) Calculated using the CKD-EPI Creatinine Equation (2021)   . Anion gap 07/22/2020 12  5 - 15 Final   Performed at [facility], [address], [place], [place] [address]    Assessment:  [name] is a 80 y.o. 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 He presented with a long history of normocytic anemia unresponsive to oral  iron. He has mild leukopenia. He has mild splenomegaly (15.8 cm) by abdominal ultrasound on 06/04/2015. DIPPS score is 70 (age 42- 1, hemoglobin less than 10- 2) and score of 4 if 1% circulating blasts included from 07/17/2014.  Bone marrowon 06/11/2014 was most consistent with primary myelofibrosis. Bone marrow biopsy showed 1% abnormal cells: CD45+, CD5+, CD10, CD11c+/-, CD19+, CD2-+, (dim), CD22+ (dim, CD23+, CD38-/+, FMC7-, HLA-DR+, sig lambda+(dim).Blasts were not increased 1.2%; hypercellular for age: 62%; [id number] mutation was negative. CALR mutationpositive. 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 MDS FISH panel was negative.  Labs in 12/2015included the following normal studies: direct [place], epo level, haptoglobin, ANA, hemoglobin electrophoresis, hepatitis B surface antigen, HIV testing, UPEP. 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 LDH was elevated (520). Hepatitis C by PCR was less than 2 IU/ml.   LDH has been followed: 514 on 05/14/2014, 453 on 06/12/2015, 454 on 12/11/2015, 424 on 06/27/2016, 387 on 12/28/2016, 434 on 07/05/2017, 407 on 01/03/2018, 400 on 07/04/2018, 396 on 01/14/2019, 422 on 07/16/2019 and 426 on 01/15/2020.    He  has B12 deficiency.  B12 was 248 on 07/16/2019 and 687 on 08/19/2019.  He is on oral B12.  The patient received the [place] COVID-19 vaccine on 08/01/2019 and 08/22/2019.  Symptomatically, he feels good.  He denies early satiety and blood in the stool. His stools were black around 3 weeks ago but then went back to normal. He does not know when his last colonoscopy was but he states the he has diverticulitis.  Hemoglobin is 8.7.  Plan: 1.   Labs today: CBC with diff, CMP, LDH. 2.   Primary  myelofibrosis Clinically, he is doing fairly well.             Exam reveals no adenopathy or hepatosplenomegaly.               Hematocrit 30.0.  Hemoglobin 9.6.  MCV 95.2.  WBC 2900 with an [place] of 1300 on 01/15/2020.  Hematocrit 25.5.  Hemoglobin 8.0.  MCV 94.8.  WBC 2300 with an [place] of 1200 on 07/22/2020.  LDH is 372 (stable to improved).  Counts appear to be drifting down.    Patient may require follow-up bone marrow aspirate and biopsy.    Continue fever neutropenia precautions.  Discuss importance of establishing new hematologist at [facility] for alternating visits.  Discuss short-term follow-up CBC. 3.   B12 deficiency  B12 was 248 on 07/16/2019 and 895 on 07/22/2020.     He is on oral B12.  Folate 28.0 today. 4.   Normocytic anemia  Hematocrit 25.5.  Hemoglobin 8.0.  MCV 94.8.   Ferritin 239 with an iron saturation of 8% and a TIBC of 237 today.   Retic 3.4%.  Patient denies any bleeding.   Patient unaware of last colonoscopy.  Unclear significance of melena 3 weeks ago.     No upper GI symptoms.  Patient will require follow-up with his gastroenterologist. 5.   MD to call [facility] re: patient f/u after Dr Annabelle Harman left. 6.   RTC in 2 weeks for labs (CBC with diff and hold tube). 7.   RTC in 4 weeks for MD assess and labs (CBC with diff and +/- others).  I discussed the assessment and treatment plan with the patient.  The patient was provided an opportunity to ask questions and all were answered.  The patient agreed with the plan and demonstrated an understanding of the instructions.  The patient was advised to call back if the symptoms worsen or if the condition fails to improve as anticipated.  I provided 15 minutes of face-to-face time during this this encounter and > 50% was spent counseling as documented under my assessment and plan.   Lequita Asal, MD, PhD  07/22/2020, 3:21 PM   I, De Burrs, am acting as scribe for [employer]. Mike Gip, MD, PhD.  I, Vu Liebman  C. Mike Gip, MD, have reviewed the above documentation for accuracy and completeness, and I agree with the above.

## 2020-07-22 ENCOUNTER — Inpatient Hospital Stay: Payer: Medicare Other | Attending: Hematology and Oncology | Admitting: Hematology and Oncology

## 2020-07-22 ENCOUNTER — Inpatient Hospital Stay: Payer: Medicare Other

## 2020-07-22 ENCOUNTER — Encounter: Payer: Self-pay | Admitting: Hematology and Oncology

## 2020-07-22 ENCOUNTER — Other Ambulatory Visit: Payer: Self-pay

## 2020-07-22 VITALS — BP 126/61 | HR 72 | Temp 98.1°F | Resp 16 | Wt 193.5 lb

## 2020-07-22 DIAGNOSIS — Z87891 Personal history of nicotine dependence: Secondary | ICD-10-CM | POA: Insufficient documentation

## 2020-07-22 DIAGNOSIS — D649 Anemia, unspecified: Secondary | ICD-10-CM | POA: Diagnosis not present

## 2020-07-22 DIAGNOSIS — D474 Osteomyelofibrosis: Secondary | ICD-10-CM

## 2020-07-22 DIAGNOSIS — E538 Deficiency of other specified B group vitamins: Secondary | ICD-10-CM | POA: Insufficient documentation

## 2020-07-22 DIAGNOSIS — D471 Chronic myeloproliferative disease: Secondary | ICD-10-CM | POA: Insufficient documentation

## 2020-07-22 DIAGNOSIS — M170 Bilateral primary osteoarthritis of knee: Secondary | ICD-10-CM | POA: Insufficient documentation

## 2020-07-22 DIAGNOSIS — D7581 Myelofibrosis: Secondary | ICD-10-CM

## 2020-07-22 LAB — COMPREHENSIVE METABOLIC PANEL
ALT: 17 U/L (ref 0–44)
AST: 22 U/L (ref 15–41)
Albumin: 3.6 g/dL (ref 3.5–5.0)
Alkaline Phosphatase: 104 U/L (ref 38–126)
Anion gap: 12 (ref 5–15)
BUN: 23 mg/dL (ref 8–23)
CO2: 24 mmol/L (ref 22–32)
Calcium: 8.4 mg/dL — ABNORMAL LOW (ref 8.9–10.3)
Chloride: 105 mmol/L (ref 98–111)
Creatinine, Ser: 1.36 mg/dL — ABNORMAL HIGH (ref 0.61–1.24)
GFR, Estimated: 53 mL/min — ABNORMAL LOW (ref >60)
Glucose, Bld: 147 mg/dL — ABNORMAL HIGH (ref 70–99)
Potassium: 4.1 mmol/L (ref 3.5–5.1)
Sodium: 141 mmol/L (ref 135–145)
Total Bilirubin: 0.9 mg/dL (ref 0.3–1.2)
Total Protein: 6.4 g/dL — ABNORMAL LOW (ref 6.5–8.1)

## 2020-07-22 LAB — CBC WITH DIFFERENTIAL/PLATELET
Abs Immature Granulocytes: 0.2 10*3/uL — ABNORMAL HIGH (ref 0.00–0.07)
Basophils Absolute: 0 10*3/uL (ref 0.0–0.1)
Basophils Relative: 0 %
Eosinophils Absolute: 0 10*3/uL (ref 0.0–0.5)
Eosinophils Relative: 0 %
HCT: 25.5 % — ABNORMAL LOW (ref 39.0–52.0)
Hemoglobin: 8 g/dL — ABNORMAL LOW (ref 13.0–17.0)
Immature Granulocytes: 9 %
Lymphocytes Relative: 25 %
Lymphs Abs: 0.6 10*3/uL — ABNORMAL LOW (ref 0.7–4.0)
MCH: 29.7 pg (ref 26.0–34.0)
MCHC: 31.4 g/dL (ref 30.0–36.0)
MCV: 94.8 fL (ref 80.0–100.0)
Monocytes Absolute: 0.3 10*3/uL (ref 0.1–1.0)
Monocytes Relative: 14 %
Neutro Abs: 1.2 10*3/uL — ABNORMAL LOW (ref 1.7–7.7)
Neutrophils Relative %: 52 %
Platelets: 213 10*3/uL (ref 150–400)
RBC: 2.69 MIL/uL — ABNORMAL LOW (ref 4.22–5.81)
RDW: 17.5 % — ABNORMAL HIGH (ref 11.5–15.5)
Smear Review: NORMAL
WBC: 2.3 10*3/uL — ABNORMAL LOW (ref 4.0–10.5)
nRBC: 3.5 % — ABNORMAL HIGH (ref 0.0–0.2)

## 2020-07-22 LAB — LACTATE DEHYDROGENASE: LDH: 372 U/L — ABNORMAL HIGH (ref 98–192)

## 2020-07-22 LAB — IRON AND TIBC
Iron: 18 ug/dL — ABNORMAL LOW (ref 45–182)
Saturation Ratios: 8 % — ABNORMAL LOW (ref 17.9–39.5)
TIBC: 237 ug/dL — ABNORMAL LOW (ref 250–450)
UIBC: 219 ug/dL

## 2020-07-22 LAB — RETICULOCYTES
Immature Retic Fract: 32 % — ABNORMAL HIGH (ref 2.3–15.9)
RBC.: 2.62 MIL/uL — ABNORMAL LOW (ref 4.22–5.81)
Retic Count, Absolute: 88 K/uL (ref 19.0–186.0)
Retic Ct Pct: 3.4 % — ABNORMAL HIGH (ref 0.4–3.1)

## 2020-07-22 LAB — FOLATE: Folate: 28 ng/mL (ref >5.9)

## 2020-07-22 LAB — FERRITIN: Ferritin: 239 ng/mL (ref 24–336)

## 2020-07-23 DIAGNOSIS — M9903 Segmental and somatic dysfunction of lumbar region: Secondary | ICD-10-CM | POA: Diagnosis not present

## 2020-07-23 DIAGNOSIS — M5136 Other intervertebral disc degeneration, lumbar region: Secondary | ICD-10-CM | POA: Diagnosis not present

## 2020-07-23 DIAGNOSIS — M9905 Segmental and somatic dysfunction of pelvic region: Secondary | ICD-10-CM | POA: Diagnosis not present

## 2020-07-23 DIAGNOSIS — M955 Acquired deformity of pelvis: Secondary | ICD-10-CM | POA: Diagnosis not present

## 2020-07-23 LAB — VITAMIN B12: Vitamin B-12: 895 pg/mL (ref 180–914)

## 2020-07-24 DIAGNOSIS — Z87891 Personal history of nicotine dependence: Secondary | ICD-10-CM | POA: Diagnosis not present

## 2020-07-24 DIAGNOSIS — E538 Deficiency of other specified B group vitamins: Secondary | ICD-10-CM | POA: Diagnosis not present

## 2020-07-24 DIAGNOSIS — M170 Bilateral primary osteoarthritis of knee: Secondary | ICD-10-CM | POA: Diagnosis not present

## 2020-07-24 DIAGNOSIS — D471 Chronic myeloproliferative disease: Secondary | ICD-10-CM | POA: Diagnosis not present

## 2020-07-24 DIAGNOSIS — D649 Anemia, unspecified: Secondary | ICD-10-CM | POA: Diagnosis not present

## 2020-07-26 DIAGNOSIS — M170 Bilateral primary osteoarthritis of knee: Secondary | ICD-10-CM | POA: Diagnosis not present

## 2020-07-26 DIAGNOSIS — E538 Deficiency of other specified B group vitamins: Secondary | ICD-10-CM | POA: Diagnosis not present

## 2020-07-26 DIAGNOSIS — D649 Anemia, unspecified: Secondary | ICD-10-CM | POA: Diagnosis not present

## 2020-07-26 DIAGNOSIS — Z87891 Personal history of nicotine dependence: Secondary | ICD-10-CM | POA: Diagnosis not present

## 2020-07-26 DIAGNOSIS — D471 Chronic myeloproliferative disease: Secondary | ICD-10-CM | POA: Diagnosis not present

## 2020-07-27 DIAGNOSIS — M955 Acquired deformity of pelvis: Secondary | ICD-10-CM | POA: Diagnosis not present

## 2020-07-27 DIAGNOSIS — M9905 Segmental and somatic dysfunction of pelvic region: Secondary | ICD-10-CM | POA: Diagnosis not present

## 2020-07-27 DIAGNOSIS — M5136 Other intervertebral disc degeneration, lumbar region: Secondary | ICD-10-CM | POA: Diagnosis not present

## 2020-07-27 DIAGNOSIS — M9903 Segmental and somatic dysfunction of lumbar region: Secondary | ICD-10-CM | POA: Diagnosis not present

## 2020-07-28 ENCOUNTER — Other Ambulatory Visit: Payer: Self-pay

## 2020-07-28 DIAGNOSIS — D649 Anemia, unspecified: Secondary | ICD-10-CM | POA: Diagnosis not present

## 2020-07-28 DIAGNOSIS — D471 Chronic myeloproliferative disease: Secondary | ICD-10-CM | POA: Diagnosis not present

## 2020-07-28 DIAGNOSIS — E538 Deficiency of other specified B group vitamins: Secondary | ICD-10-CM | POA: Diagnosis not present

## 2020-07-28 DIAGNOSIS — R162 Hepatomegaly with splenomegaly, not elsewhere classified: Secondary | ICD-10-CM | POA: Diagnosis not present

## 2020-07-28 DIAGNOSIS — Z87891 Personal history of nicotine dependence: Secondary | ICD-10-CM | POA: Diagnosis not present

## 2020-07-28 DIAGNOSIS — M170 Bilateral primary osteoarthritis of knee: Secondary | ICD-10-CM | POA: Diagnosis not present

## 2020-07-28 DIAGNOSIS — D630 Anemia in neoplastic disease: Secondary | ICD-10-CM | POA: Diagnosis not present

## 2020-07-28 LAB — OCCULT BLOOD X 1 CARD TO LAB, STOOL
Fecal Occult Bld: NEGATIVE
Fecal Occult Bld: NEGATIVE
Fecal Occult Bld: NEGATIVE

## 2020-07-29 DIAGNOSIS — M9905 Segmental and somatic dysfunction of pelvic region: Secondary | ICD-10-CM | POA: Diagnosis not present

## 2020-07-29 DIAGNOSIS — M5136 Other intervertebral disc degeneration, lumbar region: Secondary | ICD-10-CM | POA: Diagnosis not present

## 2020-07-29 DIAGNOSIS — M9903 Segmental and somatic dysfunction of lumbar region: Secondary | ICD-10-CM | POA: Diagnosis not present

## 2020-07-29 DIAGNOSIS — M955 Acquired deformity of pelvis: Secondary | ICD-10-CM | POA: Diagnosis not present

## 2020-07-30 ENCOUNTER — Other Ambulatory Visit: Payer: Self-pay

## 2020-08-06 ENCOUNTER — Other Ambulatory Visit: Payer: Self-pay

## 2020-08-06 ENCOUNTER — Inpatient Hospital Stay: Payer: Medicare Other | Attending: Hematology and Oncology

## 2020-08-06 DIAGNOSIS — R7989 Other specified abnormal findings of blood chemistry: Secondary | ICD-10-CM

## 2020-08-06 DIAGNOSIS — Z87891 Personal history of nicotine dependence: Secondary | ICD-10-CM | POA: Diagnosis not present

## 2020-08-06 DIAGNOSIS — E538 Deficiency of other specified B group vitamins: Secondary | ICD-10-CM | POA: Diagnosis not present

## 2020-08-06 DIAGNOSIS — D474 Osteomyelofibrosis: Secondary | ICD-10-CM

## 2020-08-06 DIAGNOSIS — D471 Chronic myeloproliferative disease: Secondary | ICD-10-CM | POA: Diagnosis not present

## 2020-08-06 DIAGNOSIS — D649 Anemia, unspecified: Secondary | ICD-10-CM

## 2020-08-06 LAB — CBC WITH DIFFERENTIAL/PLATELET
Abs Immature Granulocytes: 0.28 10*3/uL — ABNORMAL HIGH (ref 0.00–0.07)
Basophils Absolute: 0 10*3/uL (ref 0.0–0.1)
Basophils Relative: 0 %
Eosinophils Absolute: 0 10*3/uL (ref 0.0–0.5)
Eosinophils Relative: 0 %
HCT: 27.2 % — ABNORMAL LOW (ref 39.0–52.0)
Hemoglobin: 8.7 g/dL — ABNORMAL LOW (ref 13.0–17.0)
Immature Granulocytes: 10 %
Lymphocytes Relative: 25 %
Lymphs Abs: 0.7 10*3/uL (ref 0.7–4.0)
MCH: 30.4 pg (ref 26.0–34.0)
MCHC: 32 g/dL (ref 30.0–36.0)
MCV: 95.1 fL (ref 80.0–100.0)
Monocytes Absolute: 0.5 10*3/uL (ref 0.1–1.0)
Monocytes Relative: 16 %
Neutro Abs: 1.4 10*3/uL — ABNORMAL LOW (ref 1.7–7.7)
Neutrophils Relative %: 49 %
Platelets: 208 10*3/uL (ref 150–400)
RBC: 2.86 MIL/uL — ABNORMAL LOW (ref 4.22–5.81)
RDW: 17.5 % — ABNORMAL HIGH (ref 11.5–15.5)
Smear Review: NORMAL
WBC: 2.9 10*3/uL — ABNORMAL LOW (ref 4.0–10.5)
nRBC: 4.1 % — ABNORMAL HIGH (ref 0.0–0.2)

## 2020-08-06 LAB — SAMPLE TO BLOOD BANK

## 2020-08-09 DIAGNOSIS — E538 Deficiency of other specified B group vitamins: Secondary | ICD-10-CM | POA: Insufficient documentation

## 2020-08-18 ENCOUNTER — Other Ambulatory Visit: Payer: Medicare Other

## 2020-08-18 ENCOUNTER — Ambulatory Visit: Payer: Medicare Other | Admitting: Hematology and Oncology

## 2020-08-23 ENCOUNTER — Other Ambulatory Visit: Payer: Self-pay | Admitting: *Deleted

## 2020-08-23 DIAGNOSIS — D7581 Myelofibrosis: Secondary | ICD-10-CM

## 2020-08-23 DIAGNOSIS — D649 Anemia, unspecified: Secondary | ICD-10-CM

## 2020-08-24 NOTE — Progress Notes (Signed)
[facility]  [address], [address] [place], [place] [address] Phone: [phone]  Fax: [phone]   Clinic Day:  08/25/2020  Referring physician: [name], DO   Chief Complaint: [name] is a 80 y.o. 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   HPI:  The patient was last seen in the medical oncology clinic on 07/22/2020. At that time, he felt good.  He denied early satiety and blood in the stool. His stools were black 3 week prior to his visit but then went back to normal. He did not know when his last colonoscopy was but he stated the he had diverticulitis.   Labs at last visit revealed a hematocrit of 25.5, hemoglobin 8.0, MCV 94.8, platelets 213,000, WBC 2,300 (ANC 1,200). Ferritin was 239 with an iron saturation of 8% and a TIBC of 237. Creatinine was 1.36 (CrCl 53 ml/min). Calcium was 8.4 and albumin 3.6. Reticulocyte count was 3.4%. LDH was 372. Fecal occult cards were negative. Vitamin B12 was 895 and folate 28.0.  We discussed establishing with a new hematologist at [facility] for ongoing shared care.  The patient saw Dr. Pablo Ledger, [place] [facility], on 07/28/2020. He reported worsening fatigue. Exam revealed mild pallor and a palpable spleen.  Splenic US was ordered. If ultrasound and labs were suggestive of progression, plan to check EPO level and consider ESAs.  Ruxolitinib was not felt indicated.  Clinical trials were to be considered.  Follow-up was planned for 4 weeks.  CBC on 08/06/2020 revealed a hematocrit of 27.2, hemoglobin 8.7, MCV 95.1, platelets 208,000, WBC 2,900 (ANC 1,400).  Splenic ultrasound is scheduled for 09/02/2020. He sees Dr. Sabino Dick on 09/08/2020.  During the interim, he has been "good." He denies early satiety.   Past Medical History:  Diagnosis Date  . Arthritis   . Benign hypertensive renal disease   . GERD (gastroesophageal reflux disease)   . Heartburn   . History of retinal detachment   .  Hyperlipidemia   . Hypertension   . Melanoma ([place])    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 approximately 10-15 years ago  . Myelofibrosis ([place])   . Prostate hypertrophy   . Stroke [facility]) June 2009   R brain subcortical infarct    Past Surgical History:  Procedure Laterality Date  . ASPIRATION / INJECTION RENAL CYST  Feb 2017  . BACK SURGERY     approx 20- 25 years ago  . GAS INSERTION  08/11/2011   Procedure: INSERTION OF GAS;  Surgeon: Hayden Pedro, MD;  Location: [place];  Service: Ophthalmology;  Laterality: Right;  [id number]  . SCLERAL BUCKLE  08/11/2011   Procedure: SCLERAL BUCKLE;  Surgeon: Hayden Pedro, MD;  Location: [place];  Service: Ophthalmology;  Laterality: Right;  Marland Kitchen VARICOSE VEIN SURGERY      Family History  Problem Relation Age of Onset  . Heart disease Father   . Diabetes Son   . Kidney disease Neg Hx   . Prostate cancer Neg Hx     Social History:  reports that he quit smoking about 10 years ago. His smoking use included cigarettes. He smoked 0.25 packs per day. He has never used smokeless tobacco. He reports that he does not drink alcohol and does not use drugs. 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 He lives in [place]. The patient is alone today.  Allergies:  Allergies  Allergen Reactions  . Meloxicam Nausea And Vomiting    Current Medications: Current Outpatient Medications  Medication Sig Dispense Refill  . calcium carbonate (OSCAL) 1500 (600 Ca) MG TABS tablet Take by mouth.    . Cholecalciferol (D3-1000 PO) Take by mouth daily.    . clopidogrel (PLAVIX) 75 MG tablet Take 1 tablet (75 mg total) by mouth daily. 90 tablet 1  . diclofenac sodium (VOLTAREN) 1 % GEL Apply 4 g topically 4 (four) times daily as needed (for neck pain). 100 g 6  . DULoxetine (CYMBALTA) 20 MG capsule TAKE 1 CAPSULE BY MOUTH EVERY DAY 90 capsule 0  . gabapentin (NEURONTIN) 300 MG capsule TAKE 1 CAPSULE BY MOUTH 3 TIMES DAILY 270  capsule 1  . Ginsengs-Saw Palmetto (MULTI GINSENG & SAW PALMETTO) 500 MG CAPS Take 500 mg by mouth daily.    Marland Kitchen losartan-hydrochlorothiazide (HYZAAR) 100-25 MG tablet Take 1 tablet by mouth daily. 90 tablet 1  . meclizine (ANTIVERT) 25 MG tablet TAKE 1 TABLET BY MOUTH 3 TIMES DAILY AS NEEDED FOR DIZZINESS 90 tablet 0  . Multiple Vitamin (MULITIVITAMIN WITH MINERALS) TABS Take 1 tablet by mouth daily.    . Naproxen Sodium 220 MG CAPS Take by mouth as needed.     Marland Kitchen omeprazole (PRILOSEC) 40 MG capsule TAKE 1 CAPSULE (40 MG) BY MOUTH EVERY DAY 90 capsule 1  . simvastatin (ZOCOR) 40 MG tablet Take 1 tablet (40 mg total) by mouth daily. 90 tablet 1  . tamsulosin (FLOMAX) 0.4 MG CAPS capsule Take 1 capsule (0.4 mg total) by mouth daily. 90 capsule 1  . zinc gluconate 50 MG tablet Take by mouth.    Marland Kitchen azelastine (ASTELIN) 0.1 % nasal spray Place 1 spray into both nostrils 2 (two) times daily. Use in each nostril as directed (Patient not taking: No sig reported) 30 mL 0  . hydrochlorothiazide (HYDRODIURIL) 25 MG tablet Take 25 mg by mouth daily. (Patient not taking: Reported on 08/25/2020)    . losartan (COZAAR) 100 MG tablet Take by mouth. (Patient not taking: Reported on 08/25/2020)    . mupirocin ointment (BACTROBAN) 2 % Apply 1 application topically daily. With dressing changes (Patient not taking: No sig reported) 22 g 0  . tadalafil (CIALIS) 20 MG tablet Take 0.5-1 tablets (10-20 mg total) by mouth every other day as needed for erectile dysfunction. (Patient not taking: No sig reported) 5 tablet 11   No current facility-administered medications for this visit.    Review of Systems  Constitutional: Positive for weight loss (2 lbs). Negative for chills, diaphoresis, fever and malaise/fatigue.  HENT: Positive for hearing loss. Negative for congestion, ear discharge, ear pain, nosebleeds, sinus pain, sore throat and tinnitus.   Eyes: Negative.  Negative for blurred vision, double vision and photophobia.   Respiratory: Negative.  Negative for cough, hemoptysis, sputum production and shortness of breath.   Cardiovascular: Negative.  Negative for chest pain, palpitations and leg swelling.  Gastrointestinal: Negative for abdominal pain, blood in stool, constipation, diarrhea, heartburn, melena, nausea and vomiting.       Denies early satiety.  Genitourinary: Negative for dysuria, frequency, hematuria and urgency.       BPH.  Musculoskeletal: Positive for joint pain (arthritis in knees). Negative for back pain, myalgias and neck pain.  Skin: Negative.  Negative for itching and rash.  Neurological: Negative.  Negative for dizziness, tingling, sensory change, weakness and headaches.  Endo/Heme/Allergies: Negative.  Does not bruise/bleed easily.  Psychiatric/Behavioral: Negative.  Negative for depression,  memory loss and substance abuse. The patient is not nervous/anxious and does not have insomnia.   All other systems reviewed and are negative.  Performance status (ECOG):  0  Vitals Blood pressure 106/82, pulse 73, temperature (!) 97.2 F (36.2 C), temperature source Tympanic, resp. rate 18, weight 191 lb 12.8 oz (87 kg), SpO2 98 %.   Physical Exam Nursing note reviewed.  Constitutional:      General: He is not in acute distress.    Appearance: He is well-developed. He is not diaphoretic.  HENT:     Head: Normocephalic and atraumatic.     Mouth/Throat:     Mouth: Mucous membranes are moist.     Pharynx: Oropharynx is clear.  Eyes:     General: No scleral icterus.    Extraocular Movements: Extraocular movements intact.     Conjunctiva/sclera: Conjunctivae normal.     Pupils: Pupils are equal, round, and reactive to light.     Comments: Blue eyes.   Cardiovascular:     Rate and Rhythm: Normal rate and regular rhythm.     Heart sounds: Normal heart sounds. No murmur heard.   Pulmonary:     Effort: Pulmonary effort is normal. No respiratory distress.     Breath sounds: Normal breath  sounds. No wheezing or rales.  Chest:     Chest wall: No tenderness.  Breasts:     Right: No supraclavicular adenopathy.     Left: No supraclavicular adenopathy.    Abdominal:     General: Bowel sounds are normal. There is no distension.     Palpations: Abdomen is soft. There is splenomegaly (edge of spleen palpable). There is no hepatomegaly or mass.     Tenderness: There is no abdominal tenderness. There is no guarding or rebound.  Musculoskeletal:        General: No swelling or tenderness. Normal range of motion.     Cervical back: Normal range of motion and neck supple.  Lymphadenopathy:     Head:     Right side of head: No preauricular, posterior auricular or occipital adenopathy.     Left side of head: No preauricular, posterior auricular or occipital adenopathy.     Cervical: No cervical adenopathy.     Upper Body:     Right upper body: No supraclavicular adenopathy.     Left upper body: No supraclavicular adenopathy.     Lower Body: No right inguinal adenopathy. No left inguinal adenopathy.  Skin:    General: Skin is warm and dry.  Neurological:     Mental Status: He is alert and oriented to person, place, and time.  Psychiatric:        Behavior: Behavior normal.        Thought Content: Thought content normal.        Judgment: Judgment normal.    Appointment on 08/25/2020  Component Date Value Ref Range Status  . WBC 08/25/2020 2.7* 4.0 - 10.5 K/uL Final  . RBC 08/25/2020 3.06* 4.22 - 5.81 MIL/uL Final  . Hemoglobin 08/25/2020 9.0* 13.0 - 17.0 g/dL Final  . HCT 08/25/2020 28.7* 39.0 - 52.0 % Final  . MCV 08/25/2020 93.8  80.0 - 100.0 fL Final  . MCH 08/25/2020 29.4  26.0 - 34.0 pg Final  . MCHC 08/25/2020 31.4  30.0 - 36.0 g/dL Final  . RDW 08/25/2020 17.3* 11.5 - 15.5 % Final  . Platelets 08/25/2020 209  150 - 400 K/uL Final  . nRBC 08/25/2020 2.9* 0.0 - 0.2 %  Final  . Neutrophils Relative % 08/25/2020 58  % Final  . Neutro Abs 08/25/2020 1.6* 1.7 - 7.7 K/uL  Final  . Lymphocytes Relative 08/25/2020 19  % Final  . Lymphs Abs 08/25/2020 0.5* 0.7 - 4.0 K/uL Final  . Monocytes Relative 08/25/2020 13  % Final  . Monocytes Absolute 08/25/2020 0.3  0.1 - 1.0 K/uL Final  . Eosinophils Relative 08/25/2020 0  % Final  . Eosinophils Absolute 08/25/2020 0.0  0.0 - 0.5 K/uL Final  . Basophils Relative 08/25/2020 0  % Final  . Basophils Absolute 08/25/2020 0.0  0.0 - 0.1 K/uL Final  . Immature Granulocytes 08/25/2020 10  % Final  . Abs Immature Granulocytes 08/25/2020 0.27* 0.00 - 0.07 K/uL Final   Performed at [facility], [address] [place], [place] [address]    Assessment:  [name] is a 80 y.o. 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 He presented with a long history of normocytic anemia unresponsive to oral iron. He has mild leukopenia. He has mild splenomegaly (15.8 cm) by abdominal ultrasound on 06/04/2015. DIPPS score is 63 (age 53- 1, hemoglobin less than 10- 2) and score of 4 if 1% circulating blasts included from 07/17/2014.  Bone marrowon 06/11/2014 was most consistent with primary myelofibrosis. Bone marrow biopsy showed 1% abnormal cells: CD45+, CD5+, CD10, CD11c+/-, CD19+, CD2-+, (dim), CD22+ (dim, CD23+, CD38-/+, FMC7-, HLA-DR+, sig lambda+(dim).Blasts were not increased 1.2%; hypercellular for age: 3%; [id number] mutation was negative. CALR mutationpositive. 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 MDS FISH panel was negative.  Labs in 12/2015included the following normal studies: direct [place], epo level, haptoglobin, ANA, hemoglobin electrophoresis, hepatitis B surface antigen, HIV testing, UPEP. 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 LDH was elevated (520). Hepatitis C by  PCR was less than 2 IU/ml.   LDH has been followed: 514 on 05/14/2014, 453 on 06/12/2015, 454 on 12/11/2015, 424 on 06/27/2016, 387 on 12/28/2016, 434 on 07/05/2017, 407 on 01/03/2018, 400 on 07/04/2018, 396 on 01/14/2019, 422 on 07/16/2019, 426 on 01/15/2020, and 372 on 07/22/2020.    He has B12 deficiency.  B12 was 248 on 07/16/2019 and 895 on 07/22/2020.  He is on oral B12.  Folate was 28 on 07/22/2020.  The patient received the [place] COVID-19 vaccine on 08/01/2019 and 08/22/2019.  Symptomatically, he feels "good." He denies early satiety. Exam reveals mild splenomegaly.  Plan: 1.   Labs today: CBC with diff, epo level. 2.   Primary myelofibrosis Clinically, he is asymptomatic.             Exam reveals no adenopathy or hepatomegaly.  Spleen tip is palpable.               Hematocrit 30.0.  Hemoglobin 9.6.  MCV 95.2.  WBC 2900 with an [place] of 1300 on 01/15/2020.  Hematocrit 25.5.  Hemoglobin 8.0.  MCV 94.8.  WBC 2300 with an [place] of 1200 on 07/22/2020.  Hematocrit 28.7.  Hemoglobin 9.0.  MCV 93.8.  WBC 2700 with an [place] of 1600 on 08/25/2020.  LDH was 372 on 07/22/2020 (stable to improved).  Counts appear to be drifting down.    Review interval note by Dr. Pablo Ledger on 07/28/2020.    He may be a candidate for ESAs.     Ruxolitinib was not felt indicated.     Clinical trials were to  be considered.   His hemoglobin has improved.  No current plan for treatment.  Discuss follow-up in the [facility] hematology clinic. 3.   B12 deficiency  B12 was 248 on 07/16/2019 and 895 on 07/22/2020.     He is on oral B12.  Folate 28.0 on 07/22/2020.  Continue to monitor annually. 4.   Normocytic anemia  Hematocrit 28.7.  Hemoglobin 9.0.  MCV 93.8.   Ferritin 239 with an iron saturation of 8% and a TIBC of 237 on 07/22/2020.   Retic 3.4%.  Patient denies any bleeding.   Patient unaware of last colonoscopy.  Unclear significance of prior melena .     No upper GI symptoms.  Patient will  require follow-up with his gastroenterologist.  Etiology likely secondary to underlying myelofibrosis. 5.   RTC in 2 months for MD assessment and labs (CBC with diff, CMP, LDH).  I discussed the assessment and treatment plan with the patient.  The patient was provided an opportunity to ask questions and all were answered.  The patient agreed with the plan and demonstrated an understanding of the instructions.  The patient was advised to call back if the symptoms worsen or if the condition fails to improve as anticipated.   Lequita Asal, MD, PhD  08/25/2020, 3:42 PM   I, De Burrs, am acting as scribe for [employer]. Mike Gip, MD, PhD.  I, Melissa C. Mike Gip, MD, have reviewed the above documentation for accuracy and completeness, and I agree with the above.

## 2020-08-25 ENCOUNTER — Encounter: Payer: Self-pay | Admitting: Hematology and Oncology

## 2020-08-25 ENCOUNTER — Other Ambulatory Visit: Payer: Self-pay | Admitting: Hematology and Oncology

## 2020-08-25 ENCOUNTER — Inpatient Hospital Stay (HOSPITAL_BASED_OUTPATIENT_CLINIC_OR_DEPARTMENT_OTHER): Payer: Medicare Other | Admitting: Hematology and Oncology

## 2020-08-25 ENCOUNTER — Other Ambulatory Visit: Payer: Self-pay

## 2020-08-25 ENCOUNTER — Inpatient Hospital Stay: Payer: Medicare Other

## 2020-08-25 VITALS — BP 106/82 | HR 73 | Temp 97.2°F | Resp 18 | Wt 191.8 lb

## 2020-08-25 DIAGNOSIS — D7581 Myelofibrosis: Secondary | ICD-10-CM

## 2020-08-25 DIAGNOSIS — D649 Anemia, unspecified: Secondary | ICD-10-CM

## 2020-08-25 DIAGNOSIS — E538 Deficiency of other specified B group vitamins: Secondary | ICD-10-CM | POA: Diagnosis not present

## 2020-08-25 DIAGNOSIS — R161 Splenomegaly, not elsewhere classified: Secondary | ICD-10-CM

## 2020-08-25 DIAGNOSIS — D471 Chronic myeloproliferative disease: Secondary | ICD-10-CM | POA: Diagnosis not present

## 2020-08-25 DIAGNOSIS — Z87891 Personal history of nicotine dependence: Secondary | ICD-10-CM | POA: Diagnosis not present

## 2020-08-25 DIAGNOSIS — R162 Hepatomegaly with splenomegaly, not elsewhere classified: Secondary | ICD-10-CM

## 2020-08-25 DIAGNOSIS — D474 Osteomyelofibrosis: Secondary | ICD-10-CM

## 2020-08-25 LAB — CBC WITH DIFFERENTIAL/PLATELET
Abs Immature Granulocytes: 0.27 10*3/uL — ABNORMAL HIGH (ref 0.00–0.07)
Basophils Absolute: 0 10*3/uL (ref 0.0–0.1)
Basophils Relative: 0 %
Eosinophils Absolute: 0 10*3/uL (ref 0.0–0.5)
Eosinophils Relative: 0 %
HCT: 28.7 % — ABNORMAL LOW (ref 39.0–52.0)
Hemoglobin: 9 g/dL — ABNORMAL LOW (ref 13.0–17.0)
Immature Granulocytes: 10 %
Lymphocytes Relative: 19 %
Lymphs Abs: 0.5 10*3/uL — ABNORMAL LOW (ref 0.7–4.0)
MCH: 29.4 pg (ref 26.0–34.0)
MCHC: 31.4 g/dL (ref 30.0–36.0)
MCV: 93.8 fL (ref 80.0–100.0)
Monocytes Absolute: 0.3 10*3/uL (ref 0.1–1.0)
Monocytes Relative: 13 %
Neutro Abs: 1.6 10*3/uL — ABNORMAL LOW (ref 1.7–7.7)
Neutrophils Relative %: 58 %
Platelets: 209 10*3/uL (ref 150–400)
RBC: 3.06 MIL/uL — ABNORMAL LOW (ref 4.22–5.81)
RDW: 17.3 % — ABNORMAL HIGH (ref 11.5–15.5)
WBC: 2.7 10*3/uL — ABNORMAL LOW (ref 4.0–10.5)
nRBC: 2.9 % — ABNORMAL HIGH (ref 0.0–0.2)

## 2020-08-25 NOTE — Progress Notes (Signed)
Patient reports occasional rectal discharge.

## 2020-08-26 LAB — ERYTHROPOIETIN: Erythropoietin: 34.6 m[IU]/mL — ABNORMAL HIGH (ref 2.6–18.5)

## 2020-09-02 DIAGNOSIS — R161 Splenomegaly, not elsewhere classified: Secondary | ICD-10-CM | POA: Diagnosis not present

## 2020-09-02 DIAGNOSIS — N281 Cyst of kidney, acquired: Secondary | ICD-10-CM | POA: Diagnosis not present

## 2020-09-02 DIAGNOSIS — D471 Chronic myeloproliferative disease: Secondary | ICD-10-CM | POA: Diagnosis not present

## 2020-09-08 DIAGNOSIS — D759 Disease of blood and blood-forming organs, unspecified: Secondary | ICD-10-CM | POA: Diagnosis not present

## 2020-09-08 DIAGNOSIS — D471 Chronic myeloproliferative disease: Secondary | ICD-10-CM | POA: Diagnosis not present

## 2020-09-08 DIAGNOSIS — D7289 Other specified disorders of white blood cells: Secondary | ICD-10-CM | POA: Diagnosis not present

## 2020-09-21 ENCOUNTER — Ambulatory Visit (INDEPENDENT_AMBULATORY_CARE_PROVIDER_SITE_OTHER): Payer: Medicare Other | Admitting: Family Medicine

## 2020-09-21 ENCOUNTER — Ambulatory Visit: Payer: Medicare Other | Admitting: Dermatology

## 2020-09-21 ENCOUNTER — Other Ambulatory Visit: Payer: Self-pay

## 2020-09-21 ENCOUNTER — Encounter: Payer: Self-pay | Admitting: Family Medicine

## 2020-09-21 VITALS — BP 146/79 | HR 66 | Temp 98.7°F | Wt 194.0 lb

## 2020-09-21 DIAGNOSIS — S01312A Laceration without foreign body of left ear, initial encounter: Secondary | ICD-10-CM | POA: Diagnosis not present

## 2020-09-21 DIAGNOSIS — H6982 Other specified disorders of Eustachian tube, left ear: Secondary | ICD-10-CM

## 2020-09-21 MED ORDER — PREDNISONE 50 MG PO TABS: 50.0000 mg | ORAL_TABLET | Freq: Every day | ORAL | 0 refills | Status: DC

## 2020-09-21 NOTE — Progress Notes (Signed)
BP (!) 146/79   Pulse 66   Temp 98.7 F (37.1 C)   Wt 194 lb (88 kg)   SpO2 100%   BMI 27.84 kg/m    Subjective:    Patient ID: [name], male    DOB: 08/30/1940, 80 y.o.   MRN: [id number]  HPI: [name] is a 80 y.o. male  Chief Complaint  Patient presents with  . Ear Pain    Patient states he has left ear pain, ear was bleeding a few days ago. Patient states he can not put his hearing aid in.    EAR PAIN Duration: few days Involved ear(s): left Severity:  moderate  Quality:  Aching and sore Fever: no Otorrhea: no Upper respiratory infection symptoms: no Pruritus: no Hearing loss: yes Water immersion no Using Q-tips: no Recurrent otitis media: no Status: worse Treatments attempted: none   Relevant past medical, surgical, family and social history reviewed and updated as indicated. Interim medical history since our last visit reviewed. Allergies and medications reviewed and updated.  Review of Systems  Constitutional: Negative.   HENT: Positive for ear pain and hearing loss. Negative for congestion, dental problem, drooling, ear discharge, facial swelling, mouth sores, nosebleeds, postnasal drip, rhinorrhea, sinus pressure, sinus pain, sneezing, sore throat, tinnitus, trouble swallowing and voice change.   Respiratory: Negative.   Cardiovascular: Negative.   Gastrointestinal: Negative.   Musculoskeletal: Negative.   Psychiatric/Behavioral: Negative.     Per HPI unless specifically indicated above     Objective:    BP (!) 146/79   Pulse 66   Temp 98.7 F (37.1 C)   Wt 194 lb (88 kg)   SpO2 100%   BMI 27.84 kg/m   Wt Readings from Last 3 Encounters:  09/21/20 194 lb (88 kg)  08/25/20 191 lb 12.8 oz (87 kg)  07/22/20 193 lb 7.3 oz (87.8 kg)    Physical Exam Vitals and nursing note reviewed.  Constitutional:      General: He is not in acute distress.    Appearance: Normal appearance. He is not ill-appearing, toxic-appearing or  diaphoretic.  HENT:     Head: Normocephalic and atraumatic.     Right Ear: External ear normal.     Left Ear: Tympanic membrane and external ear normal.     Ears:     Comments: Small laceration at bottom of L EAC    Nose: Nose normal.     Mouth/Throat:     Mouth: Mucous membranes are moist.     Pharynx: Oropharynx is clear.  Eyes:     General: No scleral icterus.       Right eye: No discharge.        Left eye: No discharge.     Extraocular Movements: Extraocular movements intact.     Conjunctiva/sclera: Conjunctivae normal.     Pupils: Pupils are equal, round, and reactive to light.  Cardiovascular:     Rate and Rhythm: Normal rate and regular rhythm.     Pulses: Normal pulses.     Heart sounds: Normal heart sounds. No murmur heard. No friction rub. No gallop.   Pulmonary:     Effort: Pulmonary effort is normal. No respiratory distress.     Breath sounds: Normal breath sounds. No stridor. No wheezing, rhonchi or rales.  Chest:     Chest wall: No tenderness.  Musculoskeletal:        General: Normal range of motion.     Cervical back: Normal range  of motion and neck supple.  Skin:    General: Skin is warm and dry.     Capillary Refill: Capillary refill takes less than 2 seconds.     Coloration: Skin is not jaundiced or pale.     Findings: No bruising, erythema, lesion or rash.  Neurological:     General: No focal deficit present.     Mental Status: He is alert and oriented to person, place, and time. Mental status is at baseline.  Psychiatric:        Mood and Affect: Mood normal.        Behavior: Behavior normal.        Thought Content: Thought content normal.        Judgment: Judgment normal.     Results for orders placed or performed in visit on 08/25/20  Erythropoietin  Result Value Ref Range   Erythropoietin 34.6 (H) 2.6 - 18.5 mIU/mL  CBC with Differential/Platelet  Result Value Ref Range   WBC 2.7 (L) 4.0 - 10.5 K/uL   RBC 3.06 (L) 4.22 - 5.81 MIL/uL    Hemoglobin 9.0 (L) 13.0 - 17.0 g/dL   HCT 28.7 (L) 39.0 - 52.0 %   MCV 93.8 80.0 - 100.0 fL   MCH 29.4 26.0 - 34.0 pg   MCHC 31.4 30.0 - 36.0 g/dL   RDW 17.3 (H) 11.5 - 15.5 %   Platelets 209 150 - 400 K/uL   nRBC 2.9 (H) 0.0 - 0.2 %   Neutrophils Relative % 58 %   Neutro Abs 1.6 (L) 1.7 - 7.7 K/uL   Lymphocytes Relative 19 %   Lymphs Abs 0.5 (L) 0.7 - 4.0 K/uL   Monocytes Relative 13 %   Monocytes Absolute 0.3 0.1 - 1.0 K/uL   Eosinophils Relative 0 %   Eosinophils Absolute 0.0 0.0 - 0.5 K/uL   Basophils Relative 0 %   Basophils Absolute 0.0 0.0 - 0.1 K/uL   Immature Granulocytes 10 %   Abs Immature Granulocytes 0.27 (H) 0.00 - 0.07 K/uL      Assessment & Plan:   Problem List Items Addressed This Visit   None   Visit Diagnoses    ETD (Eustachian tube dysfunction), left    -  Primary   Likely exacerbated by allergies. Will start prednisone. Call with any concerns. Conitnue to monitor.    Laceration of left ear, initial encounter       Will use neosporin PRN. Call if not getting better or getting worse.        Follow up plan: Return if symptoms worsen or fail to improve.

## 2020-09-30 DIAGNOSIS — M9903 Segmental and somatic dysfunction of lumbar region: Secondary | ICD-10-CM | POA: Diagnosis not present

## 2020-09-30 DIAGNOSIS — M9905 Segmental and somatic dysfunction of pelvic region: Secondary | ICD-10-CM | POA: Diagnosis not present

## 2020-09-30 DIAGNOSIS — M955 Acquired deformity of pelvis: Secondary | ICD-10-CM | POA: Diagnosis not present

## 2020-09-30 DIAGNOSIS — M5136 Other intervertebral disc degeneration, lumbar region: Secondary | ICD-10-CM | POA: Diagnosis not present

## 2020-10-05 DIAGNOSIS — M5136 Other intervertebral disc degeneration, lumbar region: Secondary | ICD-10-CM | POA: Diagnosis not present

## 2020-10-05 DIAGNOSIS — M955 Acquired deformity of pelvis: Secondary | ICD-10-CM | POA: Diagnosis not present

## 2020-10-05 DIAGNOSIS — M9903 Segmental and somatic dysfunction of lumbar region: Secondary | ICD-10-CM | POA: Diagnosis not present

## 2020-10-05 DIAGNOSIS — M9905 Segmental and somatic dysfunction of pelvic region: Secondary | ICD-10-CM | POA: Diagnosis not present

## 2020-10-07 DIAGNOSIS — M9905 Segmental and somatic dysfunction of pelvic region: Secondary | ICD-10-CM | POA: Diagnosis not present

## 2020-10-07 DIAGNOSIS — M5136 Other intervertebral disc degeneration, lumbar region: Secondary | ICD-10-CM | POA: Diagnosis not present

## 2020-10-07 DIAGNOSIS — M955 Acquired deformity of pelvis: Secondary | ICD-10-CM | POA: Diagnosis not present

## 2020-10-07 DIAGNOSIS — M9903 Segmental and somatic dysfunction of lumbar region: Secondary | ICD-10-CM | POA: Diagnosis not present

## 2020-10-12 DIAGNOSIS — M9903 Segmental and somatic dysfunction of lumbar region: Secondary | ICD-10-CM | POA: Diagnosis not present

## 2020-10-12 DIAGNOSIS — M5136 Other intervertebral disc degeneration, lumbar region: Secondary | ICD-10-CM | POA: Diagnosis not present

## 2020-10-12 DIAGNOSIS — M9905 Segmental and somatic dysfunction of pelvic region: Secondary | ICD-10-CM | POA: Diagnosis not present

## 2020-10-12 DIAGNOSIS — M955 Acquired deformity of pelvis: Secondary | ICD-10-CM | POA: Diagnosis not present

## 2020-10-14 DIAGNOSIS — M9903 Segmental and somatic dysfunction of lumbar region: Secondary | ICD-10-CM | POA: Diagnosis not present

## 2020-10-14 DIAGNOSIS — M955 Acquired deformity of pelvis: Secondary | ICD-10-CM | POA: Diagnosis not present

## 2020-10-14 DIAGNOSIS — M5136 Other intervertebral disc degeneration, lumbar region: Secondary | ICD-10-CM | POA: Diagnosis not present

## 2020-10-14 DIAGNOSIS — M9905 Segmental and somatic dysfunction of pelvic region: Secondary | ICD-10-CM | POA: Diagnosis not present

## 2020-10-19 ENCOUNTER — Other Ambulatory Visit: Payer: Medicare Other

## 2020-10-19 ENCOUNTER — Ambulatory Visit: Payer: Medicare Other | Admitting: Hematology and Oncology

## 2020-10-19 ENCOUNTER — Ambulatory Visit: Payer: Medicare Other | Admitting: Family

## 2020-10-19 DIAGNOSIS — M955 Acquired deformity of pelvis: Secondary | ICD-10-CM | POA: Diagnosis not present

## 2020-10-19 DIAGNOSIS — M5136 Other intervertebral disc degeneration, lumbar region: Secondary | ICD-10-CM | POA: Diagnosis not present

## 2020-10-19 DIAGNOSIS — M9905 Segmental and somatic dysfunction of pelvic region: Secondary | ICD-10-CM | POA: Diagnosis not present

## 2020-10-19 DIAGNOSIS — M9903 Segmental and somatic dysfunction of lumbar region: Secondary | ICD-10-CM | POA: Diagnosis not present

## 2020-10-20 ENCOUNTER — Encounter: Payer: Self-pay | Admitting: Cardiovascular Disease

## 2020-10-20 ENCOUNTER — Inpatient Hospital Stay (HOSPITAL_BASED_OUTPATIENT_CLINIC_OR_DEPARTMENT_OTHER): Payer: Medicare Other | Admitting: Nurse Practitioner

## 2020-10-20 ENCOUNTER — Inpatient Hospital Stay: Payer: Medicare Other | Attending: Nurse Practitioner

## 2020-10-20 ENCOUNTER — Ambulatory Visit (INDEPENDENT_AMBULATORY_CARE_PROVIDER_SITE_OTHER): Payer: Medicare Other | Admitting: Cardiovascular Disease

## 2020-10-20 ENCOUNTER — Other Ambulatory Visit: Payer: Self-pay

## 2020-10-20 VITALS — BP 104/68 | HR 67 | Ht 70.0 in | Wt 187.4 lb

## 2020-10-20 VITALS — BP 132/74 | HR 67 | Temp 96.6°F | Resp 18 | Wt 188.3 lb

## 2020-10-20 DIAGNOSIS — D7581 Myelofibrosis: Secondary | ICD-10-CM | POA: Insufficient documentation

## 2020-10-20 DIAGNOSIS — E782 Mixed hyperlipidemia: Secondary | ICD-10-CM | POA: Diagnosis not present

## 2020-10-20 DIAGNOSIS — I129 Hypertensive chronic kidney disease with stage 1 through stage 4 chronic kidney disease, or unspecified chronic kidney disease: Secondary | ICD-10-CM

## 2020-10-20 DIAGNOSIS — I951 Orthostatic hypotension: Secondary | ICD-10-CM

## 2020-10-20 DIAGNOSIS — D649 Anemia, unspecified: Secondary | ICD-10-CM

## 2020-10-20 DIAGNOSIS — R161 Splenomegaly, not elsewhere classified: Secondary | ICD-10-CM | POA: Insufficient documentation

## 2020-10-20 LAB — CBC WITH DIFFERENTIAL/PLATELET
Abs Immature Granulocytes: 0.3 10*3/uL — ABNORMAL HIGH (ref 0.00–0.07)
Basophils Absolute: 0 10*3/uL (ref 0.0–0.1)
Basophils Relative: 0 %
Eosinophils Absolute: 0 10*3/uL (ref 0.0–0.5)
Eosinophils Relative: 0 %
HCT: 28 % — ABNORMAL LOW (ref 39.0–52.0)
Hemoglobin: 9 g/dL — ABNORMAL LOW (ref 13.0–17.0)
Immature Granulocytes: 9 %
Lymphocytes Relative: 22 %
Lymphs Abs: 0.7 10*3/uL (ref 0.7–4.0)
MCH: 30.3 pg (ref 26.0–34.0)
MCHC: 32.1 g/dL (ref 30.0–36.0)
MCV: 94.3 fL (ref 80.0–100.0)
Monocytes Absolute: 0.5 10*3/uL (ref 0.1–1.0)
Monocytes Relative: 16 %
Neutro Abs: 1.7 10*3/uL (ref 1.7–7.7)
Neutrophils Relative %: 53 %
Platelets: 232 10*3/uL (ref 150–400)
RBC Morphology: NONE SEEN
RBC: 2.97 MIL/uL — ABNORMAL LOW (ref 4.22–5.81)
RDW: 17.3 % — ABNORMAL HIGH (ref 11.5–15.5)
WBC: 3.2 10*3/uL — ABNORMAL LOW (ref 4.0–10.5)
nRBC: 2.5 % — ABNORMAL HIGH (ref 0.0–0.2)

## 2020-10-20 LAB — COMPREHENSIVE METABOLIC PANEL
ALT: 21 U/L (ref 0–44)
AST: 24 U/L (ref 15–41)
Albumin: 3.8 g/dL (ref 3.5–5.0)
Alkaline Phosphatase: 94 U/L (ref 38–126)
Anion gap: 7 (ref 5–15)
BUN: 46 mg/dL — ABNORMAL HIGH (ref 8–23)
CO2: 24 mmol/L (ref 22–32)
Calcium: 8.7 mg/dL — ABNORMAL LOW (ref 8.9–10.3)
Chloride: 105 mmol/L (ref 98–111)
Creatinine, Ser: 1.67 mg/dL — ABNORMAL HIGH (ref 0.61–1.24)
GFR, Estimated: 41 mL/min — ABNORMAL LOW (ref >60)
Glucose, Bld: 164 mg/dL — ABNORMAL HIGH (ref 70–99)
Potassium: 4.9 mmol/L (ref 3.5–5.1)
Sodium: 136 mmol/L (ref 135–145)
Total Bilirubin: 1 mg/dL (ref 0.3–1.2)
Total Protein: 6.5 g/dL (ref 6.5–8.1)

## 2020-10-20 LAB — LACTATE DEHYDROGENASE: LDH: 402 U/L — ABNORMAL HIGH (ref 98–192)

## 2020-10-20 MED ORDER — HYDROCHLOROTHIAZIDE 25 MG PO TABS: 25.0000 mg | ORAL_TABLET | Freq: Every day | ORAL | 3 refills | Status: DC | PRN

## 2020-10-20 NOTE — Progress Notes (Signed)
[facility]  [address], [address] [place], [place] [address] Phone: [phone]  Fax: [phone]   Clinic Day:  10/20/2020  Referring physician: [name], DO   Chief Complaint: [name] is a 80 y.o. 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   HPI:  [name] is a 80 y.o. 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  He presented with a long history of normocytic anemia unresponsive to oral iron.  He has mild leukopenia.  He has mild splenomegaly (15.8 cm) by abdominal ultrasound on 06/04/2015.  DIPPS score is 66 (age 58- 1, hemoglobin less than 10- 2) and score of 4 if 1% circulating blasts included from 07/17/2014.   Bone marrow on 06/11/2014 was most consistent with primary myelofibrosis.  Bone marrow biopsy showed 1% abnormal cells: CD45+, CD5+, CD10, CD11c+/-, CD19+, CD2-+, (dim), CD22+ (dim, CD23+, CD38-/+, FMC7-, HLA-DR+, sig lambda+(dim).  Blasts were not increased 1.2%; hypercellular for age: 61%; JAK2 V617F mutation was negative.  CALR mutation positive.  Flow cytometry included about 1% CLL/SLL phenotype cells (GM0+) of uncertain significance and some infiltrate into the marrow with increased atypical megakaryocytes.  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  MDS FISH panel was negative.   Labs in 05/2014 included the following normal studies: direct [place], epo level, haptoglobin, ANA, hemoglobin electrophoresis, hepatitis B surface antigen, HIV testing, UPEP.  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  LDH was elevated (520). Hepatitis C by PCR was less than 2 IU/ml.   LDH has been followed: 514 on 05/14/2014, 453 on 06/12/2015, 454 on 12/11/2015, 424 on 06/27/2016, 387 on 12/28/2016, 434 on 07/05/2017, 407 on 01/03/2018, 400 on 07/04/2018, 396 on 01/14/2019, 422 on 07/16/2019, 426 on 01/15/2020, and 372 on 07/22/2020.    He has B12 deficiency.  B12 was 248 on  07/16/2019 and 895 on 07/22/2020.  He is on oral B12.  Folate was 28 on 07/22/2020.  The patient received the [facility] COVID-19 vaccine on 08/01/2019 and 08/22/2019.  Interval History: Patient is 80 year old male with history of hypertension, CVA in 2009, DJD, and anemia who presents for labs follow-up for history of primary myelofibrosis, CALR gene type I mutation positive, diagnosed January 2016 with splenomegaly, anemia.  Last colonoscopy was more than 5 years ago. Denies any neurologic complaints. Denies recent fevers or illnesses. Denies any easy bleeding or bruising. No melena or hematochezia. Reports good appetite and denies weight loss. Denies chest pain. Denies any nausea, vomiting, constipation, or diarrhea. Denies urinary complaints. Patient offers no further specific complaints today.   Past Medical History:  Diagnosis Date   Arthritis    Benign hypertensive renal disease    GERD (gastroesophageal reflux disease)    Heartburn    History of retinal detachment    Hyperlipidemia    Hypertension    Melanoma ([place])    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 approximately 10-15 years ago   Myelofibrosis [facility])    Prostate hypertrophy    Stroke [facility]) June 2009   R brain subcortical infarct    Past Surgical History:  Procedure Laterality Date   ASPIRATION / INJECTION RENAL CYST  Feb 2017   BACK SURGERY     approx 20- 25 years ago   GAS INSERTION  08/11/2011   Procedure: INSERTION OF GAS;  Surgeon: Hayden Pedro, MD;  Location: [facility];  Service: Ophthalmology;  Laterality: Right;  C3F8   SCLERAL BUCKLE  08/11/2011  Procedure: SCLERAL BUCKLE;  Surgeon: Hayden Pedro, MD;  Location: [place];  Service: Ophthalmology;  Laterality: Right;   VARICOSE VEIN SURGERY      Family History  Problem Relation Age of Onset   Heart disease Father    Diabetes Son    Kidney disease Neg Hx    Prostate cancer Neg Hx     Social History:  reports that he quit smoking about 10 years ago. His smoking  use included cigarettes. He smoked 0.25 packs per day. He has never used smokeless tobacco. He reports that he does not drink alcohol and does not use drugs. 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 He lives in [place]. The patient is alone today.  Allergies:  Allergies  Allergen Reactions   Meloxicam Nausea And Vomiting    Current Medications: Current Outpatient Medications  Medication Sig Dispense Refill   calcium carbonate (OSCAL) 1500 (600 Ca) MG TABS tablet Take by mouth.     Cholecalciferol (D3-1000 PO) Take by mouth daily.     clopidogrel (PLAVIX) 75 MG tablet Take 1 tablet (75 mg total) by mouth daily. 90 tablet 1   diclofenac sodium (VOLTAREN) 1 % GEL Apply 4 g topically 4 (four) times daily as needed (for neck pain). 100 g 6   DULoxetine (CYMBALTA) 20 MG capsule TAKE 1 CAPSULE BY MOUTH EVERY DAY 90 capsule 0   gabapentin (NEURONTIN) 300 MG capsule TAKE 1 CAPSULE BY MOUTH 3 TIMES DAILY 270 capsule 1   Ginsengs-Saw Palmetto (MULTI GINSENG & SAW PALMETTO) 500 MG CAPS Take 500 mg by mouth daily.     hydrochlorothiazide (HYDRODIURIL) 25 MG tablet Take 25 mg by mouth daily.     losartan (COZAAR) 100 MG tablet Take by mouth.     losartan-hydrochlorothiazide (HYZAAR) 100-25 MG tablet Take 1 tablet by mouth daily. 90 tablet 1   meclizine (ANTIVERT) 25 MG tablet TAKE 1 TABLET BY MOUTH 3 TIMES DAILY AS NEEDED FOR DIZZINESS 90 tablet 0   Multiple Vitamin (MULITIVITAMIN WITH MINERALS) TABS Take 1 tablet by mouth daily.     mupirocin ointment (BACTROBAN) 2 % Apply 1 application topically daily. With dressing changes 22 g 0   Naproxen Sodium 220 MG CAPS Take by mouth as needed.      omeprazole (PRILOSEC) 40 MG capsule TAKE 1 CAPSULE (40 MG) BY MOUTH EVERY DAY 90 capsule 1   predniSONE (DELTASONE) 50 MG tablet Take 1 tablet (50 mg total) by mouth daily with breakfast. 5 tablet 0   simvastatin (ZOCOR) 40 MG tablet Take 1 tablet (40 mg total) by mouth  daily. 90 tablet 1   tadalafil (CIALIS) 20 MG tablet Take 0.5-1 tablets (10-20 mg total) by mouth every other day as needed for erectile dysfunction. 5 tablet 11   tamsulosin (FLOMAX) 0.4 MG CAPS capsule Take 1 capsule (0.4 mg total) by mouth daily. 90 capsule 1   zinc gluconate 50 MG tablet Take by mouth.     azelastine (ASTELIN) 0.1 % nasal spray Place 1 spray into both nostrils 2 (two) times daily. Use in each nostril as directed (Patient not taking: No sig reported) 30 mL 0   No current facility-administered medications for this visit.    Review of Systems  Constitutional:  Negative for chills, fever, malaise/fatigue and weight loss.  HENT:  Negative for hearing loss, nosebleeds, sore throat and tinnitus.   Eyes:  Negative for blurred vision and double vision.  Respiratory:  Negative for cough, hemoptysis, shortness of breath and wheezing.   Cardiovascular:  Negative for chest pain, palpitations and leg swelling.  Gastrointestinal:  Negative for abdominal pain, blood in stool, constipation, diarrhea, melena, nausea and vomiting.  Genitourinary:  Negative for dysuria and urgency.  Musculoskeletal:  Negative for back pain, falls, joint pain and myalgias.  Skin:  Negative for itching and rash.  Neurological:  Negative for dizziness, tingling, sensory change, loss of consciousness, weakness and headaches.  Endo/Heme/Allergies:  Negative for environmental allergies. Does not bruise/bleed easily.  Psychiatric/Behavioral:  Negative for depression. The patient is not nervous/anxious and does not have insomnia.   Performance status (ECOG):  0  Vitals Blood pressure 132/74, pulse 67, temperature (!) 96.6 F (35.9 C), resp. rate 18, weight 188 lb 4.4 oz (85.4 kg), SpO2 98 %.   General: Well-developed, well-nourished, no acute distress. Eyes: Pink conjunctiva, anicteric sclera. Lungs: Clear to auscultation bilaterally.  No audible wheezing or coughing Heart: Regular rate and rhythm.  Abdomen:  Soft, nontender, nondistended.  Musculoskeletal: No edema, cyanosis, or clubbing. Neuro: Alert, answering all questions appropriately. Cranial nerves grossly intact. Skin: No rashes or petechiae noted. Psych: Normal affect.  Appointment on 10/20/2020  Component Date Value Ref Range Status   LDH 10/20/2020 402* 98 - 192 U/L Final   Performed at [facility], [address] [place], [place] [address]   Sodium 10/20/2020 136  135 - 145 mmol/L Final   Potassium 10/20/2020 4.9  3.5 - 5.1 mmol/L Final   Chloride 10/20/2020 105  98 - 111 mmol/L Final   CO2 10/20/2020 24  22 - 32 mmol/L Final   Glucose, Bld 10/20/2020 164* 70 - 99 mg/dL Final   Glucose reference range applies only to samples taken after fasting for at least 8 hours.   BUN 10/20/2020 46* 8 - 23 mg/dL Final   Creatinine, Ser 10/20/2020 1.67* 0.61 - 1.24 mg/dL Final   Calcium 10/20/2020 8.7* 8.9 - 10.3 mg/dL Final   Total Protein 10/20/2020 6.5  6.5 - 8.1 g/dL Final   Albumin 10/20/2020 3.8  3.5 - 5.0 g/dL Final   AST 10/20/2020 24  15 - 41 U/L Final   ALT 10/20/2020 21  0 - 44 U/L Final   Alkaline Phosphatase 10/20/2020 94  38 - 126 U/L Final   Total Bilirubin 10/20/2020 1.0  0.3 - 1.2 mg/dL Final   GFR, Estimated 10/20/2020 41* >60 mL/min Final   Comment: (NOTE) Calculated using the CKD-EPI Creatinine Equation (2021)    Anion gap 10/20/2020 7  5 - 15 Final   Performed at [facility], [address] [place] [address]   WBC 10/20/2020 3.2* 4.0 - 10.5 K/uL Final   RBC 10/20/2020 2.97* 4.22 - 5.81 MIL/uL Final   Hemoglobin 10/20/2020 9.0* 13.0 - 17.0 g/dL Final   HCT 10/20/2020 28.0* 39.0 - 52.0 % Final   MCV 10/20/2020 94.3  80.0 - 100.0 fL Final   MCH 10/20/2020 30.3  26.0 - 34.0 pg Final   MCHC 10/20/2020 32.1  30.0 - 36.0 g/dL Final   RDW 10/20/2020 17.3* 11.5 - 15.5 % Final   Platelets 10/20/2020 232  150 - 400 K/uL Final   nRBC 10/20/2020 2.5* 0.0 - 0.2 % Final   Performed at  [facility], [address], [place], [place] [address]   Neutrophils Relative % 10/20/2020 PENDING  % Incomplete   Neutro Abs 10/20/2020 PENDING  1.7 - 7.7 K/uL Incomplete  Band Neutrophils 10/20/2020 PENDING  % Incomplete   Lymphocytes Relative 10/20/2020 PENDING  % Incomplete   Lymphs Abs 10/20/2020 PENDING  0.7 - 4.0 K/uL Incomplete   Monocytes Relative 10/20/2020 PENDING  % Incomplete   Monocytes Absolute 10/20/2020 PENDING  0.1 - 1.0 K/uL Incomplete   Eosinophils Relative 10/20/2020 PENDING  % Incomplete   Eosinophils Absolute 10/20/2020 PENDING  0.0 - 0.5 K/uL Incomplete   Basophils Relative 10/20/2020 PENDING  % Incomplete   Basophils Absolute 10/20/2020 PENDING  0.0 - 0.1 K/uL Incomplete   WBC Morphology 10/20/2020 PENDING   Incomplete   RBC Morphology 10/20/2020 PENDING   Incomplete   Smear Review 10/20/2020 PENDING   Incomplete   Other 10/20/2020 PENDING  % Incomplete   nRBC 10/20/2020 PENDING  0 /100 WBC Incomplete   Metamyelocytes Relative 10/20/2020 PENDING  % Incomplete   Myelocytes 10/20/2020 PENDING  % Incomplete   Promyelocytes Relative 10/20/2020 PENDING  % Incomplete   Blasts 10/20/2020 PENDING  % Incomplete   Immature Granulocytes 10/20/2020 PENDING  % Incomplete   Abs Immature Granulocytes 10/20/2020 PENDING  0.00 - 0.07 K/uL Incomplete    Assessment:   1.   Primary myelofibrosis- diagnosed 06/11/2014 by bone marrow biopsy, CALR type 1 mutation positive. Ultrasound confirmed splenomegaly 15.8 cm January 2016. I reviewed note from Dr. Sabino Dick at [facility]. LDH elevated at 452. Labs reviewed today. Normocytic normochromic anemia with hemoglobin ranging from 9-10. Asymptomatic. LDH 402. Stable. Previously, no circulating blasts. Ruxolitinib not indicated given absence of constitutional symptoms. He is being screened for clinical trial at [facility]. Monitor.   2.   B12 deficiency- B12 was 248 on 07/16/2019 and 895 on 07/22/2020. On oral B12. Folate 28.0 on  07/22/2020. Continue to monitor annually.  3.   Normocytic anemia- Hemoglobin 9.0. Previous work up included normal ferritin, low iron saturation, b12 and folate were normal. Suspect slow progression of myelofibrosis but he continues to be asymptomatic. Has not required transfusions. Hold on ESA or danazol as he is asymptomatic and has not required transfusions.   Plan: RTC in 3 months for MD assessment and labs (CBC with diff, CMP, LDH).  I discussed the assessment and treatment plan with the patient.  The patient was provided an opportunity to ask questions and all were answered.  The patient agreed with the plan and demonstrated an understanding of the instructions.  The patient was advised to call back if the symptoms worsen or if the condition fails to improve as anticipated.  Beckey Rutter, DNP, AGNP-C

## 2020-10-20 NOTE — Progress Notes (Signed)
Evaluation Performed:  Follow-up visit  Date:  10/20/2020   ID:  [name], DOB 06-Dec-1940, MRN [id number]  Patient Location:  [place] [address]   Provider location:   [facility], [place] office  PCP:  [name], DO  Cardiologist:  Arvid Right [facility]  Chief Complaint  Patient presents with  . 12 month follow up     "doing well." Medications reviewed by the patient verbally.      History of Present Illness:    [name] is a 80 y.o. male  past medical history of myelofibrosis diagnosed 06/11/2014.  long history of normocytic anemia unresponsive to oral iron. Had a CVA  6/09, received TPA, maintained on low-dose aspirin Carotids normal,Mild intracranial atherosclerotic type change on MRI 2009 HTN,  Chronic neck pain, multilevel disc osteophyte protrusions with bilateral foraminal narrowing right more than left mainly at C4-5 and C5-6. 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, on track to build more than 9 houses this year  Some medication confusion, losartan/HCTZ as well as losartan and HCTZ on his latest He does not know what he is taking  Lab work reviewed, bump in creatinine 1.65 Visit, does not drink much fluids on the job  Prior history of hypotension and lightheadedness exacerbated by dehydration particularly in the hot weather  Labs reviewed with him on today's visit  HGB stable 9, seen by hematology  Lipids: total chol 101, LDL 51  EKG personally reviewed by myself on todays visit Shows normal sinus rhythm with rate 67 bpm no significant ST-T wave changes  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 Arthritis   . Benign hypertensive renal disease   . GERD  (gastroesophageal reflux disease)   . Heartburn   . History of retinal detachment   . Hyperlipidemia   . Hypertension   . Melanoma ([place])    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 approximately 10-15 years ago  . Myelofibrosis ([place])   . Prostate hypertrophy   . Stroke [facility]) June 2009   R brain subcortical infarct   Past Surgical History:  Procedure Laterality Date  . ASPIRATION / INJECTION RENAL CYST  Feb 2017  . BACK SURGERY     approx 20- 25 years ago  . GAS INSERTION  08/11/2011   Procedure: INSERTION OF GAS;  Surgeon: Hayden Pedro, MD;  Location: [facility];  Service: Ophthalmology;  Laterality: Right;  C3F8  . SCLERAL BUCKLE  08/11/2011   Procedure: SCLERAL BUCKLE;  Surgeon: Hayden Pedro, MD;  Location: [facility];  Service: Ophthalmology;  Laterality: Right;  Mar[place] Kitchen VARICOSE VEIN SURGERY       Current Meds  Medication Sig  . azelastine (ASTELIN) 0.1 % nasal spray Place 1 spray into both nostrils 2 (two) times daily. Use in each nostril as directed  . calcium carbonate (OSCAL) 1500 (600 Ca) MG TABS tablet Take by mouth.  . Cholecalciferol (D3-1000 PO) Take by mouth daily.  . clopidogrel (PLAVIX) 75 MG tablet Take 1 tablet (75 mg total) by mouth daily.  . diclofenac sodium (VOLTAREN) 1 % GEL Apply 4 g topically 4 (four) times daily as needed (for neck pain).  Marland Kitchen  DULoxetine (CYMBALTA) 20 MG capsule TAKE 1 CAPSULE BY MOUTH EVERY DAY  . gabapentin (NEURONTIN) 300 MG capsule TAKE 1 CAPSULE BY MOUTH 3 TIMES DAILY  . Ginsengs-Saw Palmetto (MULTI GINSENG & SAW PALMETTO) 500 MG CAPS Take 500 mg by mouth daily.  . hydrochlorothiazide (HYDRODIURIL) 25 MG tablet Take 25 mg by mouth daily.  Marland Kitchen losartan (COZAAR) 100 MG tablet Take by mouth.  . losartan-hydrochlorothiazide (HYZAAR) 100-25 MG tablet Take 1 tablet by mouth daily.  . meclizine (ANTIVERT) 25 MG tablet TAKE 1 TABLET BY MOUTH 3 TIMES DAILY AS NEEDED FOR DIZZINESS  . Multiple Vitamin (MULITIVITAMIN WITH MINERALS) TABS Take 1 tablet by mouth  daily.  . mupirocin ointment (BACTROBAN) 2 % Apply 1 application topically daily. With dressing changes  . Naproxen Sodium 220 MG CAPS Take by mouth as needed.   Mar[place] Kitchen omeprazole (PRILOSEC) 40 MG capsule TAKE 1 CAPSULE (40 MG) BY MOUTH EVERY DAY  . simvastatin (ZOCOR) 40 MG tablet Take 1 tablet (40 mg total) by mouth daily.  . tadalafil (CIALIS) 20 MG tablet Take 0.5-1 tablets (10-20 mg total) by mouth every other day as needed for erectile dysfunction.  . tamsulosin (FLOMAX) 0.4 MG CAPS capsule Take 1 capsule (0.4 mg total) by mouth daily.  Marland Kitchen zinc gluconate 50 MG tablet Take by mouth.     Allergies:   Meloxicam   Social History   Tobacco Use  . Smoking status: Former Smoker    Packs/day: 0.25    Types: Cigarettes    Quit date: 06/06/2010    Years since quitting: 10.3  . Smokeless tobacco: Never Used  Vaping Use  . Vaping Use: Never used  Substance Use Topics  . Alcohol use: No    Alcohol/week: 0.0 standard drinks  . Drug use: No     Family Hx: The patient's family history includes Diabetes in his son; Heart disease in his father. There is no history of Kidney disease or Prostate cancer.  ROS:   Please see the history of present illness.    Review of Systems  Constitutional: Negative.   HENT: Negative.   Respiratory: Negative.   Cardiovascular: Negative.   Gastrointestinal: Negative.   Musculoskeletal: Negative.   Neurological: Negative.   Psychiatric/Behavioral: Negative.   All other systems reviewed and are negative.     Labs/Other Tests and Data Reviewed:    Recent Labs: 05/25/2020: TSH 4.020 10/20/2020: ALT 21; BUN 46; Creatinine, Ser 1.67; Hemoglobin 9.0; Platelets 232; Potassium 4.9; Sodium 136   Recent Lipid Panel Lab Results  Component Value Date/Time   CHOL 124 05/25/2020 03:00 PM   CHOL 106 04/14/2016 08:58 AM   TRIG 139 05/25/2020 03:00 PM   TRIG 99 04/14/2016 08:58 AM   HDL 29 (L) 05/25/2020 03:00 PM   CHOLHDL 2.8 12/10/2012 03:22 PM   CHOLHDL 6.4  11/21/2007 03:25 AM   LDLCALC 70 05/25/2020 03:00 PM    Wt Readings from Last 3 Encounters:  10/20/20 187 lb 6 oz (85 kg)  10/20/20 188 lb 4.4 oz (85.4 kg)  09/21/20 194 lb (88 kg)     Exam:    BP 104/68 (BP Location: Left Arm, Patient Position: Sitting, Cuff Size: Normal)   Pulse 67   Ht 5\' 10"  (1.778 m)   Wt 187 lb 6 oz (85 kg)   SpO2 98%   BMI 26.89 kg/m  Constitutional:  oriented to person, place, and time. No distress.  HENT:  Head: Grossly normal Eyes:  no discharge. No scleral icterus.  Neck: No JVD, no carotid bruits  Cardiovascular: Regular rate and rhythm, no murmurs appreciated Pulmonary/Chest: Clear to auscultation bilaterally, no wheezes or rails Abdominal: Soft.  no distension.  no tenderness.  Musculoskeletal: Normal range of motion Neurological:  normal muscle tone. Coordination normal. No atrophy Skin: Skin warm and dry Psychiatric: normal affect, pleasant  ASSESSMENT & PLAN:    PAD (peripheral artery disease) (HCC) History of hyperlipidemia, intracranial atherosclerosis on prior scanning Cholesterol at goal  Cerebrovascular accident (CVA), unspecified mechanism ([place]) On Plavix, cholesterol at goal No further episodes  Orthostatic hypotension Recommend he stop the HCTZ, stay on losartan 100 Make sure he is not on losartan HCTZ combination pill Increase his fluid intake Creatinine 1.65 above his baseline likely prerenal  Mixed hyperlipidemia Cholesterol is at goal on the current lipid regimen. No changes to the medications were made.  Chronic anemia Managed by hematology Stable, nursing times, hemoglobin 9, stable Today's blood work reviewed with him   Total encounter time more than 25 minutes  Greater than 50% was spent in counseling and coordination of care with the patient   Signed, Ida Rogue, MD  10/20/2020 3:43 PM    [place] Office [address], [place],  [address]

## 2020-10-20 NOTE — Patient Instructions (Signed)
Medication Instructions:  No changes   If you are taking losartan/HCTZ combination pill, please stop the pill  Take hydrochlorothiazide (HCTZ) only as needed for ankle swelling  Stay on losartan 100 mg daily  If you need a refill on your cardiac medications before your next appointment, please call your pharmacy.    Lab work: No new labs needed   If you have labs (blood work) drawn today and your tests are completely normal, you will receive your results only by: Marland Kitchen MyChart Message (if you have MyChart) OR . A paper copy in the mail If you have any lab test that is abnormal or we need to change your treatment, we will call you to review the results.   Testing/Procedures: No new testing needed   Follow-Up: At [facility], you and your health needs are our priority.  As part of our continuing mission to provide you with exceptional heart care, we have created designated Provider Care Teams.  These Care Teams include your primary Cardiologist (physician) and Advanced Practice Providers (APPs -  Physician Assistants and Nurse Practitioners) who all work together to provide you with the care you need, when you need it.  . You will need a follow up appointment in 12 months  . Providers on your designated Care Team:   . Murray Hodgkins, NP . Christell Faith, PA-C . Marrianne Mood, PA-C  Any Other Special Instructions Will Be Listed Below (If Applicable).  COVID-19 Vaccine Information can be found at: [email or web address].uk For questions related to vaccine distribution or appointments, please email [email or web address] or call [phone].

## 2020-10-21 DIAGNOSIS — M955 Acquired deformity of pelvis: Secondary | ICD-10-CM | POA: Diagnosis not present

## 2020-10-21 DIAGNOSIS — M9903 Segmental and somatic dysfunction of lumbar region: Secondary | ICD-10-CM | POA: Diagnosis not present

## 2020-10-21 DIAGNOSIS — M5136 Other intervertebral disc degeneration, lumbar region: Secondary | ICD-10-CM | POA: Diagnosis not present

## 2020-10-21 DIAGNOSIS — M9905 Segmental and somatic dysfunction of pelvic region: Secondary | ICD-10-CM | POA: Diagnosis not present

## 2020-10-26 DIAGNOSIS — M9905 Segmental and somatic dysfunction of pelvic region: Secondary | ICD-10-CM | POA: Diagnosis not present

## 2020-10-26 DIAGNOSIS — M955 Acquired deformity of pelvis: Secondary | ICD-10-CM | POA: Diagnosis not present

## 2020-10-26 DIAGNOSIS — M9903 Segmental and somatic dysfunction of lumbar region: Secondary | ICD-10-CM | POA: Diagnosis not present

## 2020-10-26 DIAGNOSIS — M5136 Other intervertebral disc degeneration, lumbar region: Secondary | ICD-10-CM | POA: Diagnosis not present

## 2020-10-27 ENCOUNTER — Ambulatory Visit (INDEPENDENT_AMBULATORY_CARE_PROVIDER_SITE_OTHER): Payer: Medicare Other | Admitting: Internal Medicine

## 2020-10-27 ENCOUNTER — Encounter: Payer: Self-pay | Admitting: Internal Medicine

## 2020-10-27 DIAGNOSIS — K219 Gastro-esophageal reflux disease without esophagitis: Secondary | ICD-10-CM | POA: Diagnosis not present

## 2020-10-27 DIAGNOSIS — E782 Mixed hyperlipidemia: Secondary | ICD-10-CM | POA: Diagnosis not present

## 2020-10-27 MED ORDER — AMOXICILLIN 500 MG PO CAPS: 500.0000 mg | ORAL_CAPSULE | Freq: Two times a day (BID) | ORAL | 0 refills | Status: AC

## 2020-10-27 NOTE — Progress Notes (Signed)
There were no vitals taken for this visit.   Subjective:    Patient ID: [name], male    DOB: 12/10/1940, 80 y.o.   MRN: [id number]  HPI: [name] is a 80 y.o. male  I connected with [name] on 10/27/20 by a video enabled telemedicine application and verified that I am speaking with the correct person using two identifiers.  I discussed the limitations of evaluation and management by telemedicine. The patient expressed understanding and agreed to proceed. "I discussed the limitations of evaluation and management by telemedicine and the availability of in person appointments. The patient expressed understanding and agreed to proceed"     This visit was completed via telephone due to the restrictions of the COVID-19 pandemic. All issues as above were discussed and addressed but no physical exam was performed. If it was felt that the patient should be evaluated in the office, they were directed there. The patient verbally consented to this visit. Patient was unable to complete an audio/visual visit due to Technical difficulties , oLack of internet. Due to the catastrophic nature of the COVID-19 pandemic, this visit was done through audio contact only. Location of the patient: work Location of the provider: work Those involved with this call:  Provider: Charlynne Cousins, MD CMA: Frazier Butt, [place] Desk/Registration: Levert Feinstein  Time spent on call: 10 minutes on the phone discussing health concerns. 10 minutes total spent in review of patient's record and preparation of their chart.    Ear Fullness  There is pain in both (pt was seen by his hearing aid specialiist who asked him to fu with his pcp as he had worsenign hearing loss and cloudy TM) ears. This is a recurrent problem. The current episode started in the past 7 days. The problem has been gradually worsening (was on prednisone , had a ? "virus attack " per pts verbal record-  per ENT  ). 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 but lost hearing in his left ear.). Associated symptoms include hearing loss. Pertinent negatives include no abdominal pain, coughing, diarrhea, ear discharge, headaches, neck pain, rash, rhinorrhea, sore throat or vomiting. Associated symptoms comments: Left ear is hurting.  No fever per pt or his wife. . He has tried nothing for the symptoms. His past medical history is significant for hearing loss. There is no history of a chronic ear infection or a tympanostomy tube.    No chief complaint on file.   Relevant past medical, surgical, family and social history reviewed and updated as indicated. Interim medical history since our last visit reviewed. Allergies and medications reviewed and updated.  Review of Systems  HENT: Positive for hearing loss. Negative for ear discharge, rhinorrhea and sore throat.   Respiratory: Negative for cough.   Gastrointestinal: Negative for abdominal pain, diarrhea and vomiting.  Musculoskeletal: Negative for neck pain.  Skin: Negative for rash.  Neurological: Negative for headaches.    Per HPI unless specifically indicated above     Objective:    There were no vitals taken for this visit.  Wt Readings from Last 3 Encounters:  10/20/20 187 lb 6 oz (85 kg)  10/20/20 188 lb 4.4 oz (85.4 kg)  09/21/20 194 lb (88 kg)    Physical Exam Vitals reviewed: couldnt be perfromed sec to virtual visit       Assessment & Plan:  1.No orders of the  defined types were placed in this encounter. Bil ear infection   Will start pt on amoxicillin. Use otc antihistamines, use tylenol q 4 - 6 hrly prn  Fu with pcp if symptoms do not get better.   Follow up plan: No follow-ups on file.

## 2020-10-28 DIAGNOSIS — M9905 Segmental and somatic dysfunction of pelvic region: Secondary | ICD-10-CM | POA: Diagnosis not present

## 2020-10-28 DIAGNOSIS — M5136 Other intervertebral disc degeneration, lumbar region: Secondary | ICD-10-CM | POA: Diagnosis not present

## 2020-10-28 DIAGNOSIS — M9903 Segmental and somatic dysfunction of lumbar region: Secondary | ICD-10-CM | POA: Diagnosis not present

## 2020-10-28 DIAGNOSIS — M955 Acquired deformity of pelvis: Secondary | ICD-10-CM | POA: Diagnosis not present

## 2020-10-30 ENCOUNTER — Other Ambulatory Visit: Payer: Self-pay | Admitting: Family Medicine

## 2020-10-30 NOTE — Telephone Encounter (Signed)
Pt has apt on 11/23/2020

## 2020-10-30 NOTE — Telephone Encounter (Signed)
Requested medication (s) are due for refill today: yes  Requested medication (s) are on the active medication list: yes  Last refill:  07/22/20  Future visit scheduled: yes  Notes to clinic:  historical provider    Requested Prescriptions  Pending Prescriptions Disp Refills   losartan (COZAAR) 100 MG tablet [Pharmacy Med Name: LOSARTAN POTASSIUM 100 MG TAB] 90 tablet     Sig: TAKE 1 TABLET BY MOUTH DAILY ALONG WITH HCTZ 25MG  TAB      Cardiovascular:  Angiotensin Receptor Blockers Failed - 10/30/2020  1:58 PM      Failed - Cr in normal range and within 180 days    Creatinine  Date Value Ref Range Status  06/18/2014 1.09 0.60 - 1.30 mg/dL Final   Creatinine, Ser  Date Value Ref Range Status  10/20/2020 1.67 (H) 0.61 - 1.24 mg/dL Final          Passed - K in normal range and within 180 days    Potassium  Date Value Ref Range Status  10/20/2020 4.9 3.5 - 5.1 mmol/L Final  06/18/2014 4.5 3.5 - 5.1 mmol/L Final          Passed - Patient is not pregnant      Passed - Last BP in normal range    BP Readings from Last 1 Encounters:  10/20/20 104/68          Passed - Valid encounter within last 6 months    Recent Outpatient Visits           3 days ago Mixed hyperlipidemia   [facility] Vigg, Avanti, MD   1 month ago ETD (Eustachian tube dysfunction), left   [place], Nora, DO   4 months ago [place], DO   5 months ago Benign hypertensive renal disease   [facility], Megan P, DO   5 months ago Need for influenza vaccination   [facility] [name], DO       Future Appointments             In 2 weeks Ralene Bathe, MD [place]   In 3 weeks Wynetta Emery, Barb Merino, DO [place], [place]   In 8 months  [employer], [place]

## 2020-11-04 DIAGNOSIS — M9903 Segmental and somatic dysfunction of lumbar region: Secondary | ICD-10-CM | POA: Diagnosis not present

## 2020-11-04 DIAGNOSIS — M955 Acquired deformity of pelvis: Secondary | ICD-10-CM | POA: Diagnosis not present

## 2020-11-04 DIAGNOSIS — M5136 Other intervertebral disc degeneration, lumbar region: Secondary | ICD-10-CM | POA: Diagnosis not present

## 2020-11-04 DIAGNOSIS — M9905 Segmental and somatic dysfunction of pelvic region: Secondary | ICD-10-CM | POA: Diagnosis not present

## 2020-11-05 ENCOUNTER — Other Ambulatory Visit: Payer: Self-pay | Admitting: Family Medicine

## 2020-11-05 NOTE — Telephone Encounter (Signed)
Patient needs as refill, he is out of the medication

## 2020-11-05 NOTE — Telephone Encounter (Signed)
Pt has apt on 11/23/2020

## 2020-11-05 NOTE — Telephone Encounter (Signed)
Requested medication (s) are due for refill today: no   Requested medication (s) are on the active medication list: yes   Last refill:  08/05/2020  Future visit scheduled: yes   Notes to clinic:  this refill cannot be delegated    Requested Prescriptions  Pending Prescriptions Disp Refills   meclizine (ANTIVERT) 25 MG tablet [Pharmacy Med Name: MECLIZINE HCL 25 MG TAB] 90 tablet 0    Sig: TAKE 1 TABLET BY MOUTH 3 TIMES DAILY AS NEEDED FOR DIZZINESS      Not Delegated - Gastroenterology: Antiemetics Failed - 11/05/2020  9:31 AM      Failed - This refill cannot be delegated      Passed - Valid encounter within last 6 months    Recent Outpatient Visits           1 week ago Mixed hyperlipidemia   [facility] Vigg, Avanti, MD   1 month ago ETD (Eustachian tube dysfunction), left   [facility] [place], [place], DO   4 months ago [place], [place], DO   5 months ago Benign hypertensive renal disease   [facility] [place], [place], DO   6 months ago Need for influenza vaccination   [facility] [name], DO       Future Appointments             In 1 week Ralene Bathe, MD [place]   In 2 weeks Wynetta Emery, Barb Merino, DO [place], [place]   In 8 months  [employer], [facility]

## 2020-11-09 DIAGNOSIS — M5136 Other intervertebral disc degeneration, lumbar region: Secondary | ICD-10-CM | POA: Diagnosis not present

## 2020-11-09 DIAGNOSIS — M955 Acquired deformity of pelvis: Secondary | ICD-10-CM | POA: Diagnosis not present

## 2020-11-09 DIAGNOSIS — M9905 Segmental and somatic dysfunction of pelvic region: Secondary | ICD-10-CM | POA: Diagnosis not present

## 2020-11-09 DIAGNOSIS — M9903 Segmental and somatic dysfunction of lumbar region: Secondary | ICD-10-CM | POA: Diagnosis not present

## 2020-11-11 DIAGNOSIS — M9903 Segmental and somatic dysfunction of lumbar region: Secondary | ICD-10-CM | POA: Diagnosis not present

## 2020-11-11 DIAGNOSIS — M955 Acquired deformity of pelvis: Secondary | ICD-10-CM | POA: Diagnosis not present

## 2020-11-11 DIAGNOSIS — M9905 Segmental and somatic dysfunction of pelvic region: Secondary | ICD-10-CM | POA: Diagnosis not present

## 2020-11-11 DIAGNOSIS — M5136 Other intervertebral disc degeneration, lumbar region: Secondary | ICD-10-CM | POA: Diagnosis not present

## 2020-11-12 ENCOUNTER — Other Ambulatory Visit: Payer: Self-pay | Admitting: Family Medicine

## 2020-11-12 DIAGNOSIS — H6982 Other specified disorders of Eustachian tube, left ear: Secondary | ICD-10-CM

## 2020-11-12 NOTE — Progress Notes (Signed)
Ref to ENT in [place]

## 2020-11-16 ENCOUNTER — Other Ambulatory Visit: Payer: Self-pay

## 2020-11-16 ENCOUNTER — Ambulatory Visit (INDEPENDENT_AMBULATORY_CARE_PROVIDER_SITE_OTHER): Payer: Medicare Other | Admitting: Dermatology

## 2020-11-16 DIAGNOSIS — L821 Other seborrheic keratosis: Secondary | ICD-10-CM | POA: Diagnosis not present

## 2020-11-16 DIAGNOSIS — L820 Inflamed seborrheic keratosis: Secondary | ICD-10-CM

## 2020-11-16 DIAGNOSIS — M5136 Other intervertebral disc degeneration, lumbar region: Secondary | ICD-10-CM | POA: Diagnosis not present

## 2020-11-16 DIAGNOSIS — D692 Other nonthrombocytopenic purpura: Secondary | ICD-10-CM

## 2020-11-16 DIAGNOSIS — M9905 Segmental and somatic dysfunction of pelvic region: Secondary | ICD-10-CM | POA: Diagnosis not present

## 2020-11-16 DIAGNOSIS — L570 Actinic keratosis: Secondary | ICD-10-CM

## 2020-11-16 DIAGNOSIS — L578 Other skin changes due to chronic exposure to nonionizing radiation: Secondary | ICD-10-CM | POA: Diagnosis not present

## 2020-11-16 DIAGNOSIS — M9903 Segmental and somatic dysfunction of lumbar region: Secondary | ICD-10-CM | POA: Diagnosis not present

## 2020-11-16 DIAGNOSIS — M955 Acquired deformity of pelvis: Secondary | ICD-10-CM | POA: Diagnosis not present

## 2020-11-16 NOTE — Progress Notes (Signed)
   Follow-Up Visit   Subjective  [name] is a 80 y.o. male who presents for the following: Follow-up (Patient here today for 4 month follow up following excision of cyst at back. Patient advises he is doing well. He does have some spots at arm and behind ear he would like checked. No itching or bleeding. ).  The following portions of the chart were reviewed this encounter and updated as appropriate:   Tobacco  Allergies  Meds  Problems  Med Hx  Surg Hx  Fam Hx      Review of Systems:  No other skin or systemic complaints except as noted in HPI or Assessment and Plan.  Objective  Well appearing patient in no apparent distress; mood and affect are within normal limits.  All skin waist up examined.  Left Forearm - Anterior Erythematous keratotic or waxy stuck-on papule or plaque.   face, ears, neck x 17 (17) Erythematous thin papules/macules with gritty scale.    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, ears, neck x 17  Destruction of lesion - face, ears, neck x 17 Complexity: simple   Destruction method: cryotherapy   Informed consent: discussed and consent obtained   Timeout:  patient name, date of birth, surgical site, and procedure verified Lesion destroyed using liquid nitrogen: Yes   Region frozen until ice ball extended beyond lesion: Yes   Outcome: patient tolerated procedure well with no complications   Post-procedure details: wound  care instructions given    Actinic Damage - chronic, secondary to cumulative UV radiation exposure/sun exposure over time - diffuse scaly erythematous macules with underlying dyspigmentation - Recommend daily broad spectrum sunscreen SPF 30+ to sun-exposed areas, reapply every 2 hours as needed.  - Recommend staying in the shade or wearing long sleeves, sun glasses (UVA+UVB protection) and wide brim hats (4-inch brim around the entire circumference of the hat). - Call for new or changing lesions.  Seborrheic Keratoses - Stuck-on, waxy, tan-brown papules and/or plaques  - Benign-appearing - Discussed benign etiology and prognosis. - Observe - Call for any changes  Purpura - Chronic; persistent and recurrent.  Treatable, but not curable. - Violaceous macules and patches - Benign - Related to trauma, age, sun damage and/or use of blood thinners, chronic use of topical and/or oral steroids - Observe - Can use OTC arnica containing moisturizer such as Dermend Bruise Formula if desired - Call for worsening or other concerns  Return in about 1 year (around 11/16/2021) for AK follow up.  Graciella Belton, RMA, am acting as scribe for Sarina Ser, MD . Documentation: I have reviewed the above documentation for accuracy and completeness, and I agree with the above.  Sarina Ser, MD

## 2020-11-16 NOTE — Patient Instructions (Signed)
Cryotherapy Aftercare  Wash gently with soap and water everyday.   Apply Vaseline and Band-Aid daily until healed.   Melanoma ABCDEs  Melanoma is the most dangerous type of skin cancer, and is the leading cause of death from skin disease.  You are more likely to develop melanoma if you: Have light-colored skin, light-colored eyes, or red or blond hair Spend a lot of time in the sun Tan regularly, either outdoors or in a tanning bed Have had blistering sunburns, especially during childhood Have a close family member who has had a melanoma Have atypical moles or large birthmarks  Early detection of melanoma is key since treatment is typically straightforward and cure rates are extremely high if we catch it early.   The first sign of melanoma is often a change in a mole or a new dark spot.  The ABCDE system is a way of remembering the signs of melanoma.  A for asymmetry:  The two halves do not match. B for border:  The edges of the growth are irregular. C for color:  A mixture of colors are present instead of an even brown color. D for diameter:  Melanomas are usually (but not always) greater than 6mm - the size of a pencil eraser. E for evolution:  The spot keeps changing in size, shape, and color.  Please check your skin once per month between visits. You can use a small mirror in front and a large mirror behind you to keep an eye on the back side or your body.   If you see any new or changing lesions before your next follow-up, please call to schedule a visit.  Please continue daily skin protection including broad spectrum sunscreen SPF 30+ to sun-exposed areas, reapplying every 2 hours as needed when you're outdoors.    If you have any questions or concerns for your doctor, please call our main line at [phone] and press option 4 to reach your doctor's medical assistant. If no one answers, please leave a voicemail as directed and we will return your call as soon as possible.  Messages left after 4 pm will be answered the following business day.   You may also send us a message via MyChart. We typically respond to MyChart messages within 1-2 business days.  For prescription refills, please ask your pharmacy to contact our office. Our fax number is [phone].  If you have an urgent issue when the clinic is closed that cannot wait until the next business day, you can page your doctor at the number below.    Please note that while we do our best to be available for urgent issues outside of office hours, we are not available 24/7.   If you have an urgent issue and are unable to reach us, you may choose to seek medical care at your doctor's office, retail clinic, urgent care center, or emergency room.  If you have a medical emergency, please immediately call 911 or go to the emergency department.  Pager Numbers  - Dr. Kowalski: [phone]  - Dr. Moye: [phone]  - Dr. Stewart: [phone]  In the event of inclement weather, please call our main line at [phone] for an update on the status of any delays or closures.  Dermatology Medication Tips: Please keep the boxes that topical medications come in in order to help keep track of the instructions about where and how to use these. Pharmacies typically print the medication instructions only on the boxes and not directly on   the medication tubes.   If your medication is too expensive, please contact our office at [phone] option 4 or send us a message through MyChart.   We are unable to tell what your co-pay for medications will be in advance as this is different depending on your insurance coverage. However, we may be able to find a substitute medication at lower cost or fill out paperwork to get insurance to cover a needed medication.   If a prior authorization is required to get your medication covered by your insurance company, please allow us 1-2 business days to complete this process.  Drug  prices often vary depending on where the prescription is filled and some pharmacies may offer cheaper prices.  The website www.goodrx.com contains coupons for medications through different pharmacies. The prices here do not account for what the cost may be with help from insurance (it may be cheaper with your insurance), but the website can give you the price if you did not use any insurance.  - You can print the associated coupon and take it with your prescription to the pharmacy.  - You may also stop by our office during regular business hours and pick up a GoodRx coupon card.  - If you need your prescription sent electronically to a different pharmacy, notify our office through [facility] MyChart or by phone at [phone] option 4.  

## 2020-11-17 ENCOUNTER — Encounter: Payer: Self-pay | Admitting: Dermatology

## 2020-11-18 ENCOUNTER — Other Ambulatory Visit: Payer: Self-pay | Admitting: Family Medicine

## 2020-11-18 DIAGNOSIS — M955 Acquired deformity of pelvis: Secondary | ICD-10-CM | POA: Diagnosis not present

## 2020-11-18 DIAGNOSIS — M5136 Other intervertebral disc degeneration, lumbar region: Secondary | ICD-10-CM | POA: Diagnosis not present

## 2020-11-18 DIAGNOSIS — M9905 Segmental and somatic dysfunction of pelvic region: Secondary | ICD-10-CM | POA: Diagnosis not present

## 2020-11-18 DIAGNOSIS — M9903 Segmental and somatic dysfunction of lumbar region: Secondary | ICD-10-CM | POA: Diagnosis not present

## 2020-11-23 ENCOUNTER — Ambulatory Visit: Payer: Medicare Other | Admitting: Family Medicine

## 2020-11-23 DIAGNOSIS — M9905 Segmental and somatic dysfunction of pelvic region: Secondary | ICD-10-CM | POA: Diagnosis not present

## 2020-11-23 DIAGNOSIS — M5136 Other intervertebral disc degeneration, lumbar region: Secondary | ICD-10-CM | POA: Diagnosis not present

## 2020-11-23 DIAGNOSIS — M9903 Segmental and somatic dysfunction of lumbar region: Secondary | ICD-10-CM | POA: Diagnosis not present

## 2020-11-23 DIAGNOSIS — M955 Acquired deformity of pelvis: Secondary | ICD-10-CM | POA: Diagnosis not present

## 2020-11-25 DIAGNOSIS — M9905 Segmental and somatic dysfunction of pelvic region: Secondary | ICD-10-CM | POA: Diagnosis not present

## 2020-11-25 DIAGNOSIS — M955 Acquired deformity of pelvis: Secondary | ICD-10-CM | POA: Diagnosis not present

## 2020-11-25 DIAGNOSIS — M5136 Other intervertebral disc degeneration, lumbar region: Secondary | ICD-10-CM | POA: Diagnosis not present

## 2020-11-25 DIAGNOSIS — M9903 Segmental and somatic dysfunction of lumbar region: Secondary | ICD-10-CM | POA: Diagnosis not present

## 2020-11-27 ENCOUNTER — Other Ambulatory Visit: Payer: Self-pay | Admitting: Family Medicine

## 2020-11-30 DIAGNOSIS — M9905 Segmental and somatic dysfunction of pelvic region: Secondary | ICD-10-CM | POA: Diagnosis not present

## 2020-11-30 DIAGNOSIS — M5136 Other intervertebral disc degeneration, lumbar region: Secondary | ICD-10-CM | POA: Diagnosis not present

## 2020-11-30 DIAGNOSIS — M9903 Segmental and somatic dysfunction of lumbar region: Secondary | ICD-10-CM | POA: Diagnosis not present

## 2020-11-30 DIAGNOSIS — M955 Acquired deformity of pelvis: Secondary | ICD-10-CM | POA: Diagnosis not present

## 2020-12-02 DIAGNOSIS — M955 Acquired deformity of pelvis: Secondary | ICD-10-CM | POA: Diagnosis not present

## 2020-12-02 DIAGNOSIS — M5136 Other intervertebral disc degeneration, lumbar region: Secondary | ICD-10-CM | POA: Diagnosis not present

## 2020-12-02 DIAGNOSIS — M9903 Segmental and somatic dysfunction of lumbar region: Secondary | ICD-10-CM | POA: Diagnosis not present

## 2020-12-02 DIAGNOSIS — M9905 Segmental and somatic dysfunction of pelvic region: Secondary | ICD-10-CM | POA: Diagnosis not present

## 2020-12-09 DIAGNOSIS — M955 Acquired deformity of pelvis: Secondary | ICD-10-CM | POA: Diagnosis not present

## 2020-12-09 DIAGNOSIS — M9903 Segmental and somatic dysfunction of lumbar region: Secondary | ICD-10-CM | POA: Diagnosis not present

## 2020-12-09 DIAGNOSIS — M9905 Segmental and somatic dysfunction of pelvic region: Secondary | ICD-10-CM | POA: Diagnosis not present

## 2020-12-09 DIAGNOSIS — M5136 Other intervertebral disc degeneration, lumbar region: Secondary | ICD-10-CM | POA: Diagnosis not present

## 2020-12-16 DIAGNOSIS — M5136 Other intervertebral disc degeneration, lumbar region: Secondary | ICD-10-CM | POA: Diagnosis not present

## 2020-12-16 DIAGNOSIS — M9905 Segmental and somatic dysfunction of pelvic region: Secondary | ICD-10-CM | POA: Diagnosis not present

## 2020-12-16 DIAGNOSIS — M955 Acquired deformity of pelvis: Secondary | ICD-10-CM | POA: Diagnosis not present

## 2020-12-16 DIAGNOSIS — M9903 Segmental and somatic dysfunction of lumbar region: Secondary | ICD-10-CM | POA: Diagnosis not present

## 2020-12-21 ENCOUNTER — Other Ambulatory Visit: Payer: Self-pay | Admitting: Family Medicine

## 2020-12-21 NOTE — Telephone Encounter (Signed)
Requested Prescriptions  Pending Prescriptions Disp Refills  . gabapentin (NEURONTIN) 300 MG capsule [Pharmacy Med Name: GABAPENTIN 300 MG CAP] 270 capsule     Sig: TAKE 1 CAPSULE BY MOUTH THREE TIMES DAILY     Neurology: Anticonvulsants - gabapentin Passed - 12/21/2020 12:01 PM      Passed - Valid encounter within last 12 months    Recent Outpatient Visits          1 month ago Mixed hyperlipidemia   [facility] Vigg, Avanti, MD   3 months ago ETD (Eustachian tube dysfunction), left   [facility] [place], [place], DO   6 months ago [place], DO   7 months ago Benign hypertensive renal disease   [facility] [place], Megan P, DO   7 months ago Need for influenza vaccination   [facility] [name], DO      Future Appointments            In 2 days Rozetta Nunnery, MD [facility]   In 6 months  [facility], [place]           . clopidogrel (PLAVIX) 75 MG tablet [Pharmacy Med Name: CLOPIDOGREL BISULFATE 75 MG TAB] 90 tablet 1    Sig: TAKE ONE TABLET BY MOUTH EVERY DAY     Hematology: Antiplatelets - clopidogrel Failed - 12/21/2020 12:01 PM      Failed - Evaluate AST, ALT within 2 months of therapy initiation.      Failed - HCT in normal range and within 180 days    HCT  Date Value Ref Range Status  10/20/2020 28.0 (L) 39.0 - 52.0 % Final   Hematocrit  Date Value Ref Range Status  05/25/2020 28.0 (L) 37.5 - 51.0 % Final         Failed - HGB in normal range and within 180 days    Hemoglobin  Date Value Ref Range Status  10/20/2020 9.0 (L) 13.0 - 17.0 g/dL Final  05/25/2020 9.4 (L) 13.0 - 17.7 g/dL Final         Passed - ALT in normal range and within 360 days    ALT  Date Value Ref Range Status  10/20/2020 21 0 - 44 U/L Final   SGPT (ALT)  Date Value Ref Range Status  06/18/2014 43 U/L Final    Comment:    14-63 NOTE: New Reference  Range 12/24/13          Passed - AST in normal range and within 360 days    AST  Date Value Ref Range Status  10/20/2020 24 15 - 41 U/L Final   SGOT(AST)  Date Value Ref Range Status  06/18/2014 30 15 - 37 Unit/L Final         Passed - PLT in normal range and within 180 days    Platelets  Date Value Ref Range Status  10/20/2020 232 150 - 400 K/uL Final  05/25/2020 223 150 - 450 x10E3/uL Final         Passed - Valid encounter within last 6 months    Recent Outpatient Visits          1 month ago Mixed hyperlipidemia   [facility] Vigg, Avanti, MD   3 months ago ETD (Eustachian tube dysfunction), left   [employer], Megan P, DO   6 months ago [place]  Myles Gip, DO   7 months ago Benign hypertensive renal disease   [facility] [place], [place], DO   7 months ago Need for influenza vaccination   [facility] [name], DO      Future Appointments            In 2 days Rozetta Nunnery, MD [facility]   In 6 months  [facility], [place]

## 2020-12-21 NOTE — Telephone Encounter (Signed)
Refused too soon 11/27/20 #[id number]

## 2020-12-23 ENCOUNTER — Other Ambulatory Visit: Payer: Self-pay

## 2020-12-23 ENCOUNTER — Ambulatory Visit (INDEPENDENT_AMBULATORY_CARE_PROVIDER_SITE_OTHER): Payer: Medicare Other | Admitting: Otolaryngology

## 2020-12-23 ENCOUNTER — Encounter (INDEPENDENT_AMBULATORY_CARE_PROVIDER_SITE_OTHER): Payer: Self-pay | Admitting: Otolaryngology

## 2020-12-23 VITALS — Temp 97.7°F

## 2020-12-23 DIAGNOSIS — H9120 Sudden idiopathic hearing loss, unspecified ear: Secondary | ICD-10-CM | POA: Diagnosis not present

## 2020-12-23 DIAGNOSIS — M62830 Muscle spasm of back: Secondary | ICD-10-CM | POA: Diagnosis not present

## 2020-12-23 DIAGNOSIS — M5412 Radiculopathy, cervical region: Secondary | ICD-10-CM | POA: Diagnosis not present

## 2020-12-23 DIAGNOSIS — M5033 Other cervical disc degeneration, cervicothoracic region: Secondary | ICD-10-CM | POA: Diagnosis not present

## 2020-12-23 DIAGNOSIS — M9903 Segmental and somatic dysfunction of lumbar region: Secondary | ICD-10-CM | POA: Diagnosis not present

## 2020-12-23 NOTE — Progress Notes (Signed)
HPI: [name] is a 80 y.o. male who presents is referred by his PCP for evaluation of distorted hearing he has in his left ear for several months now.  He has hearing aids that have been adjusted.  [facility].  He states that he seen a previous ENT doctor that told him he had a viral infection that caused hearing loss.  On discussion with the patient he stated that he lost his hearing overnight 6 or 7 months ago in the left ear and some of it recovered but he still has persistent hearing problems on the left side compared to the right which is his better hearing ear. He has worked around [employer] for a number of years and is around loud noise.  He has had some neck problems for 4-5 years.  He wonders if this is contributing to his hearing problems. Unfortunately I do not have a previous audiogram that he has had performed at [facility].  He has previously been treated with steroids.  He was not scheduled for hearing test today.  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[facility])    Prostate hypertrophy    Stroke [facility]) June 2009   R brain subcortical infarct   Past Surgical History:  Procedure Laterality Date   ASPIRATION / INJECTION RENAL CYST  Feb 2017   BACK SURGERY     approx 20- 25 years ago   GAS INSERTION  08/11/2011   Procedure: INSERTION OF GAS;  Surgeon: Hayden Pedro, MD;  Location: [place];  Service: Ophthalmology;  Laterality: Right;  C3F8   SCLERAL BUCKLE  08/11/2011   Procedure: SCLERAL BUCKLE;  Surgeon: Hayden Pedro, MD;  Location: [place];  Service: Ophthalmology;  Laterality: Right;   VARICOSE VEIN SURGERY     Social History   Socioeconomic History   Marital status: Married     Spouse name: Parke Simmers    Number of children: 2   Years of education: 12+   Highest education level: Some college, no degree  Occupational History   Occupation: Education officer, community: OTHER    Comment: community   Occupation: SELF EMPLOYED    Employer: SELF EMPLOYED  Tobacco Use   Smoking status: Former    Packs/day: 0.25    Types: Cigarettes    Quit date: 06/06/2010    Years since quitting: 10.5   Smokeless tobacco: Never  Vaping Use   Vaping Use: Never used  Substance and Sexual Activity   Alcohol use: No    Alcohol/week: 0.0 standard drinks   Drug use: No   Sexual activity: Not on file  Other Topics Concern   Not on file  Social History Narrative   Pt lives at home with his family.   Caffeine Use- 2 cups daily   Patient has 2 children.    Patient has some college.    Patient is right handed.          Works full time   Scientist, physiological Strain: Low Risk    Difficulty of Paying Living Expenses: Not hard at all  Food Insecurity:  No Food Insecurity   Worried About Charity fundraiser in the Last Year: Never true   Ran Out of Food in the Last Year: Never true  Transportation Needs: No Transportation Needs   Lack of Transportation (Medical): No   Lack of Transportation (Non-Medical): No  Physical Activity: Inactive   Days of Exercise per Week: 0 days   Minutes of Exercise per Session: 0 min  Stress: No Stress Concern Present   Feeling of Stress : Not at all  Social Connections: Not on file   Family History  Problem Relation Age of Onset   Heart disease Father    Diabetes Son    Kidney disease Neg Hx    Prostate cancer Neg Hx    Allergies  Allergen Reactions   Meloxicam Nausea And Vomiting   Prior to Admission medications   Medication Sig Start Date End Date Taking? Authorizing Provider  azelastine (ASTELIN) 0.1 % nasal spray Place 1 spray into both nostrils 2 (two) times daily. Use in each nostril as directed 03/28/19   Volney American, PA-C  calcium carbonate (OSCAL) 1500 (600 Ca) MG TABS tablet Take by mouth.    [provider]  Cholecalciferol (D3-1000 PO) Take by mouth daily.    [provider]  clopidogrel (PLAVIX) 75 MG tablet TAKE ONE TABLET BY MOUTH EVERY DAY 12/21/20   Park Liter P, DO  diclofenac sodium (VOLTAREN) 1 % GEL Apply 4 g topically 4 (four) times daily as needed (for neck pain). 10/07/16   Johnson, Megan P, DO  DULoxetine (CYMBALTA) 20 MG capsule TAKE 1 CAPSULE BY MOUTH EVERY DAY 11/18/20   Johnson, Megan P, DO  gabapentin (NEURONTIN) 300 MG capsule TAKE 1 CAPSULE BY MOUTH 3 TIMES DAILY 11/27/20   Johnson, Megan P, DO  Ginsengs-Saw Palmetto (MULTI GINSENG & SAW PALMETTO) 500 MG CAPS Take 500 mg by mouth daily.    [provider]  hydrochlorothiazide (HYDRODIURIL) 25 MG tablet Take 1 tablet (25 mg total) by mouth daily as needed. 10/20/20   Minna Merritts, MD  losartan (COZAAR) 100 MG tablet TAKE 1 TABLET BY MOUTH DAILY ALONG WITH HCTZ 25MG  TAB 10/30/20   Johnson, Megan P, DO  meclizine (ANTIVERT) 25 MG tablet TAKE 1 TABLET BY MOUTH 3 TIMES DAILY AS NEEDED FOR DIZZINESS 11/05/20   Park Liter P, DO  Multiple Vitamin (MULITIVITAMIN WITH MINERALS) TABS Take 1 tablet by mouth daily.    [provider]  mupirocin ointment (BACTROBAN) 2 % Apply 1 application topically daily. With dressing changes 05/12/20   Ralene Bathe, MD  Naproxen Sodium 220 MG CAPS Take by mouth as needed.     [provider]  omeprazole (PRILOSEC) 40 MG capsule TAKE 1 CAPSULE (40 MG) BY MOUTH EVERY DAY 05/25/20   Johnson, Megan P, DO  simvastatin (ZOCOR) 40 MG tablet Take 1 tablet (40 mg total) by mouth daily. 05/25/20   Johnson, Megan P, DO  tadalafil (CIALIS) 20 MG tablet Take 0.5-1 tablets (10-20 mg total) by mouth every other day as needed for erectile dysfunction. 09/05/19   Johnson, Megan P, DO  tamsulosin (FLOMAX) 0.4 MG CAPS capsule Take 1 capsule (0.4 mg total) by mouth  daily. 05/25/20   Park Liter P, DO  zinc gluconate 50 MG tablet Take by mouth.    [provider]     Positive ROS: Otherwise negative  All other systems have been reviewed and were otherwise negative with the exception of those mentioned in  the HPI and as above.  Physical Exam: Constitutional: Alert, well-appearing, no acute distress Ears: External ears without lesions or tenderness.  Ear canals are mostly clear bilaterally with no inflammatory changes of the ear canal.  He has small amount of wax that was removed with suction and the TMs were clear bilaterally with good mobility on pneumatic otoscopy on both sides.  On screening hearing test with a tuning forks with the 512 1024 tuning forks in the office he subjectively heard better in the left ear compared to the right.  AC was greater than BC bilaterally. Nasal: External nose without lesions. Septum with mild deformity and mild rhinitis.  Both millimeters regions were clear.  No signs of infection..  Oral: Lips and gums without lesions. Tongue and palate mucosa without lesions. Posterior oropharynx clear. Neck: No palpable adenopathy or masses Respiratory: Breathing comfortably  Skin: No facial/neck lesions or rash noted.  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 Clear TM exam in the office today bilaterally.  Plan: Reviewed with him that the only treatment option for his hearing loss is going to be use of hearing aids and adjustments according to the degree of hearing loss on audiologic testing.  There is no surgical intervention or medical intervention that will improve his hearing and would recommend follow-up with [facility]ologist at [facility] if he is having difficulty with his hearing concerning adjustment of his hearing aids as needed.   Radene Journey,  MD   CC:

## 2020-12-30 DIAGNOSIS — M5412 Radiculopathy, cervical region: Secondary | ICD-10-CM | POA: Diagnosis not present

## 2020-12-30 DIAGNOSIS — M9903 Segmental and somatic dysfunction of lumbar region: Secondary | ICD-10-CM | POA: Diagnosis not present

## 2020-12-30 DIAGNOSIS — M5033 Other cervical disc degeneration, cervicothoracic region: Secondary | ICD-10-CM | POA: Diagnosis not present

## 2020-12-30 DIAGNOSIS — M62830 Muscle spasm of back: Secondary | ICD-10-CM | POA: Diagnosis not present

## 2021-01-06 DIAGNOSIS — M5412 Radiculopathy, cervical region: Secondary | ICD-10-CM | POA: Diagnosis not present

## 2021-01-06 DIAGNOSIS — M9903 Segmental and somatic dysfunction of lumbar region: Secondary | ICD-10-CM | POA: Diagnosis not present

## 2021-01-06 DIAGNOSIS — M62830 Muscle spasm of back: Secondary | ICD-10-CM | POA: Diagnosis not present

## 2021-01-06 DIAGNOSIS — M5033 Other cervical disc degeneration, cervicothoracic region: Secondary | ICD-10-CM | POA: Diagnosis not present

## 2021-01-07 DIAGNOSIS — M5412 Radiculopathy, cervical region: Secondary | ICD-10-CM | POA: Diagnosis not present

## 2021-01-13 DIAGNOSIS — M5033 Other cervical disc degeneration, cervicothoracic region: Secondary | ICD-10-CM | POA: Diagnosis not present

## 2021-01-13 DIAGNOSIS — M47812 Spondylosis without myelopathy or radiculopathy, cervical region: Secondary | ICD-10-CM | POA: Diagnosis not present

## 2021-01-13 DIAGNOSIS — M5412 Radiculopathy, cervical region: Secondary | ICD-10-CM | POA: Diagnosis not present

## 2021-01-13 DIAGNOSIS — M9903 Segmental and somatic dysfunction of lumbar region: Secondary | ICD-10-CM | POA: Diagnosis not present

## 2021-01-13 DIAGNOSIS — M62830 Muscle spasm of back: Secondary | ICD-10-CM | POA: Diagnosis not present

## 2021-01-15 DIAGNOSIS — Z6826 Body mass index (BMI) 26.0-26.9, adult: Secondary | ICD-10-CM | POA: Diagnosis not present

## 2021-01-15 DIAGNOSIS — M5412 Radiculopathy, cervical region: Secondary | ICD-10-CM | POA: Diagnosis not present

## 2021-01-15 DIAGNOSIS — R030 Elevated blood-pressure reading, without diagnosis of hypertension: Secondary | ICD-10-CM | POA: Diagnosis not present

## 2021-01-18 ENCOUNTER — Telehealth: Payer: Self-pay | Admitting: Cardiovascular Disease

## 2021-01-18 NOTE — Telephone Encounter (Signed)
   Name: [name]  DOB: 1941-01-12  MRN: [id number]   Primary Cardiologist: Ida Rogue, MD  Chart reviewed as part of pre-operative protocol coverage.   Left a message with patients wife for him to call back for ongoing preop assessment.   Of note, appears patient is on plavix for history of CVA which is managed by his PCP, therefore will defer to PCP for recommendations to hold plavix.   Abigail Butts, PA-C 01/18/2021, 11:49 AM

## 2021-01-18 NOTE — Telephone Encounter (Signed)
   Name: [name]  DOB: 03-21-1941  MRN: [id number]   Primary Cardiologist: Ida Rogue, MD  Chart reviewed as part of pre-operative protocol coverage. Patient was contacted 01/18/2021 in reference to pre-operative risk assessment for pending surgery as outlined below.  JERRICK COLGLAZIER was last seen on 10/20/20 by Dr. Rockey Situ. Since that day, BENITO LEVITIN has done well from a cardiac standpoint. 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 He can easily complete 4 METs without anginal complaints.   Therefore, based on ACC/AHA guidelines, the patient would be at acceptable risk for the planned procedure without further cardiovascular testing.   The patient was advised that if he develops new symptoms prior to surgery to contact our office to arrange for a follow-up visit, and he verbalized understanding.  It appears patient is on plavix for history of CVA which is managed by his PCP, therefore will defer to PCP for recommendations to hold plavix.   I will route this recommendation to the requesting party via Epic fax function and remove from pre-op pool. Please call with questions.  Abigail Butts, PA-C 01/18/2021, 12:22 PM

## 2021-01-18 NOTE — Telephone Encounter (Signed)
   [facility] Pre-operative Risk Assessment    Patient Name: [name]  DOB: 27-Sep-1940 MRN: [id number]  HEARTCARE STAFF:  - IMPORTANT!!!!!! Under Visit Info/Reason for Call, type in Other and utilize the format Clearance MM/DD/YY or Clearance TBD. Do not use dashes or single digits. - Please review there is not already an duplicate clearance open for this procedure. - If request is for dental extraction, please clarify the # of teeth to be extracted. - If the patient is currently at the dentist's office, call Pre-Op Callback Staff (MA/nurse) to input urgent request.  - If the patient is not currently in the dentist office, please route to the Pre-Op pool.  Request for surgical clearance:  What type of surgery is being performed? C5-C6 CESI  When is this surgery scheduled? 02/01/21  What type of clearance is required (medical clearance vs. Pharmacy clearance to hold med vs. Both)? both  Are there any medications that need to be held prior to surgery and how long? Discontinue Plavix 7 days prior, patient can resume day after procedure   Practice name and name of physician performing surgery? [facility] - Dr Lenord Carbo  What is the office phone number? [phone]   7.   What is the office fax number? [phone]  8.   Anesthesia type (None, local, MAC, general) ? Not listed    Ace Gins 01/18/2021, 11:06 AM  _________________________________________________________________   (provider comments below)

## 2021-01-20 ENCOUNTER — Telehealth: Payer: Self-pay | Admitting: Family Medicine

## 2021-01-20 ENCOUNTER — Other Ambulatory Visit: Payer: Self-pay

## 2021-01-20 ENCOUNTER — Inpatient Hospital Stay: Payer: Medicare Other | Attending: Oncology

## 2021-01-20 ENCOUNTER — Inpatient Hospital Stay (HOSPITAL_BASED_OUTPATIENT_CLINIC_OR_DEPARTMENT_OTHER): Payer: Medicare Other | Admitting: Oncology

## 2021-01-20 ENCOUNTER — Encounter: Payer: Self-pay | Admitting: Oncology

## 2021-01-20 VITALS — BP 150/74 | HR 62 | Temp 97.2°F | Resp 18 | Wt 188.4 lb

## 2021-01-20 DIAGNOSIS — R946 Abnormal results of thyroid function studies: Secondary | ICD-10-CM | POA: Diagnosis not present

## 2021-01-20 DIAGNOSIS — M62830 Muscle spasm of back: Secondary | ICD-10-CM | POA: Diagnosis not present

## 2021-01-20 DIAGNOSIS — R7402 Elevation of levels of lactic acid dehydrogenase (LDH): Secondary | ICD-10-CM | POA: Diagnosis not present

## 2021-01-20 DIAGNOSIS — E538 Deficiency of other specified B group vitamins: Secondary | ICD-10-CM | POA: Insufficient documentation

## 2021-01-20 DIAGNOSIS — M9903 Segmental and somatic dysfunction of lumbar region: Secondary | ICD-10-CM | POA: Diagnosis not present

## 2021-01-20 DIAGNOSIS — D7581 Myelofibrosis: Secondary | ICD-10-CM

## 2021-01-20 DIAGNOSIS — D474 Osteomyelofibrosis: Secondary | ICD-10-CM

## 2021-01-20 DIAGNOSIS — D649 Anemia, unspecified: Secondary | ICD-10-CM | POA: Diagnosis not present

## 2021-01-20 DIAGNOSIS — N183 Chronic kidney disease, stage 3 unspecified: Secondary | ICD-10-CM | POA: Insufficient documentation

## 2021-01-20 DIAGNOSIS — D631 Anemia in chronic kidney disease: Secondary | ICD-10-CM

## 2021-01-20 DIAGNOSIS — Z8042 Family history of malignant neoplasm of prostate: Secondary | ICD-10-CM | POA: Diagnosis not present

## 2021-01-20 DIAGNOSIS — N189 Chronic kidney disease, unspecified: Secondary | ICD-10-CM | POA: Insufficient documentation

## 2021-01-20 DIAGNOSIS — M5412 Radiculopathy, cervical region: Secondary | ICD-10-CM | POA: Diagnosis not present

## 2021-01-20 DIAGNOSIS — Z87891 Personal history of nicotine dependence: Secondary | ICD-10-CM | POA: Diagnosis not present

## 2021-01-20 DIAGNOSIS — M5033 Other cervical disc degeneration, cervicothoracic region: Secondary | ICD-10-CM | POA: Diagnosis not present

## 2021-01-20 LAB — COMPREHENSIVE METABOLIC PANEL
ALT: 23 U/L (ref 0–44)
AST: 25 U/L (ref 15–41)
Albumin: 3.8 g/dL (ref 3.5–5.0)
Alkaline Phosphatase: 94 U/L (ref 38–126)
Anion gap: 8 (ref 5–15)
BUN: 39 mg/dL — ABNORMAL HIGH (ref 8–23)
CO2: 23 mmol/L (ref 22–32)
Calcium: 8.5 mg/dL — ABNORMAL LOW (ref 8.9–10.3)
Chloride: 107 mmol/L (ref 98–111)
Creatinine, Ser: 1.29 mg/dL — ABNORMAL HIGH (ref 0.61–1.24)
GFR, Estimated: 56 mL/min — ABNORMAL LOW (ref >60)
Glucose, Bld: 131 mg/dL — ABNORMAL HIGH (ref 70–99)
Potassium: 4.3 mmol/L (ref 3.5–5.1)
Sodium: 138 mmol/L (ref 135–145)
Total Bilirubin: 1 mg/dL (ref 0.3–1.2)
Total Protein: 6.5 g/dL (ref 6.5–8.1)

## 2021-01-20 LAB — IRON AND TIBC
Iron: 54 ug/dL (ref 45–182)
Saturation Ratios: 22 % (ref 17.9–39.5)
TIBC: 248 ug/dL — ABNORMAL LOW (ref 250–450)
UIBC: 194 ug/dL

## 2021-01-20 LAB — CBC WITH DIFFERENTIAL/PLATELET
Abs Immature Granulocytes: 0.39 10*3/uL — ABNORMAL HIGH (ref 0.00–0.07)
Basophils Absolute: 0 10*3/uL (ref 0.0–0.1)
Basophils Relative: 0 %
Eosinophils Absolute: 0 10*3/uL (ref 0.0–0.5)
Eosinophils Relative: 0 %
HCT: 25.7 % — ABNORMAL LOW (ref 39.0–52.0)
Hemoglobin: 8.3 g/dL — ABNORMAL LOW (ref 13.0–17.0)
Immature Granulocytes: 14 %
Lymphocytes Relative: 20 %
Lymphs Abs: 0.6 10*3/uL — ABNORMAL LOW (ref 0.7–4.0)
MCH: 29.9 pg (ref 26.0–34.0)
MCHC: 32.3 g/dL (ref 30.0–36.0)
MCV: 92.4 fL (ref 80.0–100.0)
Monocytes Absolute: 0.4 10*3/uL (ref 0.1–1.0)
Monocytes Relative: 15 %
Neutro Abs: 1.5 10*3/uL — ABNORMAL LOW (ref 1.7–7.7)
Neutrophils Relative %: 51 %
Platelets: 204 10*3/uL (ref 150–400)
RBC Morphology: NONE SEEN
RBC: 2.78 MIL/uL — ABNORMAL LOW (ref 4.22–5.81)
RDW: 17.4 % — ABNORMAL HIGH (ref 11.5–15.5)
WBC: 2.9 10*3/uL — ABNORMAL LOW (ref 4.0–10.5)
nRBC: 2.1 % — ABNORMAL HIGH (ref 0.0–0.2)

## 2021-01-20 LAB — VITAMIN B12: Vitamin B-12: 653 pg/mL (ref 180–914)

## 2021-01-20 LAB — FERRITIN: Ferritin: 199 ng/mL (ref 24–336)

## 2021-01-20 LAB — LACTATE DEHYDROGENASE: LDH: 387 U/L — ABNORMAL HIGH (ref 98–192)

## 2021-01-20 LAB — TSH: TSH: 4.871 u[IU]/mL — ABNORMAL HIGH (ref 0.350–4.500)

## 2021-01-20 NOTE — Telephone Encounter (Signed)
I do agree with him coming off his plavix

## 2021-01-20 NOTE — Telephone Encounter (Signed)
Left message for patient informing them of Dr.Johnson recommendations. Advised patient to give our office a call back if patient or wife has any questions or concerns.

## 2021-01-20 NOTE — Telephone Encounter (Signed)
Pt wife is calling to let dr Wynetta Emery know her husband will be having an injection in his neck on 02-01-2021 by dr Holli Humbles at [facility] in [place] phone number [phone]. Per pt wife dr Rockey Situ has ok for him to stopped his plavix and also to check with dr Wynetta Emery to see if she is in agreement with her husband to stop plavix prior to having the injection. Please advise

## 2021-01-20 NOTE — Telephone Encounter (Signed)
Routing to provider to advise on message.

## 2021-01-21 ENCOUNTER — Other Ambulatory Visit: Payer: Medicare Other

## 2021-01-21 DIAGNOSIS — U071 COVID-19: Secondary | ICD-10-CM

## 2021-01-21 DIAGNOSIS — D474 Osteomyelofibrosis: Secondary | ICD-10-CM

## 2021-01-21 DIAGNOSIS — Z20822 Contact with and (suspected) exposure to covid-19: Secondary | ICD-10-CM | POA: Diagnosis not present

## 2021-01-21 DIAGNOSIS — J029 Acute pharyngitis, unspecified: Secondary | ICD-10-CM

## 2021-01-21 LAB — ERYTHROPOIETIN: Erythropoietin: 33.8 m[IU]/mL — ABNORMAL HIGH (ref 2.6–18.5)

## 2021-01-21 NOTE — Telephone Encounter (Signed)
Patient wife was notified and verbalized understanding. Patient wife states she will notify the other doctors office of Dr.Johnson recommendations. Verbalized understanding.

## 2021-01-22 LAB — SARS-COV-2, NAA 2 DAY TAT

## 2021-01-22 LAB — NOVEL CORONAVIRUS, NAA: SARS-CoV-2, NAA: NOT DETECTED

## 2021-01-24 NOTE — Progress Notes (Signed)
Hematology/Oncology Consult note [facility]  [phone] [phone]  Patient Care Team: [name], DO as PCP - General (Family Medicine) Rockey Situ Kathlene November, MD as PCP - Cardiology (Cardiology) Pieter Partridge, MD as Referring Physician (Hematology) Lequita Asal, MD (Inactive) as Referring Physician (Hematology and Oncology) Hayden Pedro, MD as Consulting Physician (Ophthalmology) Garvin Fila, MD as Consulting Physician (Neurology) Festus Aloe, MD as Consulting Physician (Urology) Rockey Situ Kathlene November, MD as Consulting Physician (Cardiology) Abbie Sons, MD (Urology)   Name of the patient: [name]  [id number]  31-May-1941   Date of visit: 01/24/21  Diagnosis-primary myelofibrosis currently under observation  Chief complaint/ Reason for visit-routine follow-up of primary myelofibrosis  Heme/Onc history: Patient is a 80 year old male diagnosed with primary myelofibrosis back in 2016.  At that time he was found to have a mild splenomegaly of 15.8 cm.DIPPS score is 50 (age 55- 1, hemoglobin less than 10- 2) and score of 4 if 1% circulating blasts included from 07/17/2014.  Bone marrow on 06/11/2014 was most consistent with primary myelofibrosis.  Bone marrow biopsy showed 1% abnormal cells: CD45+, CD5+, CD10, CD11c+/-, CD19+, CD2-+, (dim), CD22+ (dim, CD23+, CD38-/+, FMC7-, HLA-DR+, sig lambda+(dim).  Blasts were not increased 1.2%; hypercellular for age: 18%; JAK2 V617F mutation was negative.  CALR mutation positive.  Flow cytometry included about 1% CLL/SLL phenotype cells (NG2+) of uncertain significance and some infiltrate into the marrow with increased atypical megakaryocytes.  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  MDS FISH panel was negative.  Patient also [facility] benign hematology Dr. Sabino Dick for his anemia.  Patient has not required any treatment for his  primary myelofibrosis so far    Interval history-patient remains independent of his ADLs and IADLs.  He reports mild fatigue but denies other complaints at this time.  Denies any fever, unintentional weight loss or drenching night sweats  ECOG PS- 1 Pain scale- 0   Review of systems- Review of Systems  Constitutional:  Positive for malaise/fatigue. Negative for chills, fever and weight loss.  HENT:  Negative for congestion, ear discharge and nosebleeds.   Eyes:  Negative for blurred vision.  Respiratory:  Negative for cough, hemoptysis, sputum production, shortness of breath and wheezing.   Cardiovascular:  Negative for chest pain, palpitations, orthopnea and claudication.  Gastrointestinal:  Negative for abdominal pain, blood in stool, constipation, diarrhea, heartburn, melena, nausea and vomiting.  Genitourinary:  Negative for dysuria, flank pain, frequency, hematuria and urgency.  Musculoskeletal:  Negative for back pain, joint pain and myalgias.  Skin:  Negative for rash.  Neurological:  Negative for dizziness, tingling, focal weakness, seizures, weakness and headaches.  Endo/Heme/Allergies:  Does not bruise/bleed easily.  Psychiatric/Behavioral:  Negative for depression and suicidal ideas. The patient does not have insomnia.      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[facility])    Prostate hypertrophy    Stroke [facility]) June 2009   R brain subcortical infarct     Past Surgical History:  Procedure  Laterality Date   ASPIRATION / INJECTION RENAL CYST  Feb 2017   BACK SURGERY     approx 20- 25 years ago   GAS INSERTION  08/11/2011    Procedure: INSERTION OF GAS;  Surgeon: Hayden Pedro, MD;  Location: [place];  Service: Ophthalmology;  Laterality: Right;  C3F8   SCLERAL BUCKLE  08/11/2011   Procedure: SCLERAL BUCKLE;  Surgeon: Hayden Pedro, MD;  Location: [place];  Service: Ophthalmology;  Laterality: Right;   VARICOSE VEIN SURGERY      Social History   Socioeconomic History   Marital status: Married    Spouse name: Parke Simmers    Number of children: 2   Years of education: 12+   Highest education level: Some college, no degree  Occupational History   Occupation: Education officer, 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   Caffeine Use- 2 cups daily   Patient has 2 children.    Patient has some college.    Patient is right handed.          Works full time   Scientist, physiological Strain: Low Risk    Difficulty of Paying Living Expenses: Not hard at all  Food Insecurity: No Food Insecurity   Worried About Charity fundraiser in the Last Year: Never true   Arboriculturist in the Last Year: Never true  Transportation Needs: No Transportation Needs   Lack of Transportation (Medical): No   Lack of Transportation (Non-Medical): No  Physical Activity: Inactive   Days of Exercise per Week: 0 days   Minutes of Exercise per Session: 0 min  Stress: No Stress Concern Present   Feeling of Stress : Not at all  Social Connections: Not on file  Intimate Partner Violence: Not on file    Family History  Problem  Relation Age of Onset   Heart disease Father    Diabetes Son    Kidney disease Neg Hx    Prostate cancer Neg Hx      Current Outpatient Medications:    azelastine (ASTELIN) 0.1 % nasal spray, Place 1 spray into both nostrils 2 (two) times daily. Use in each nostril as directed, Disp: 30 mL, Rfl: 0   calcium carbonate (OSCAL) 1500 (600 Ca) MG TABS tablet, Take by mouth., Disp: , Rfl:    Cholecalciferol (D3-1000 PO), Take by mouth daily., Disp: , Rfl:    clopidogrel (PLAVIX) 75 MG tablet, TAKE ONE TABLET BY MOUTH EVERY DAY, Disp: 90 tablet, Rfl: 1   diclofenac sodium (VOLTAREN) 1 % GEL, Apply 4 g topically 4 (four) times daily as needed (for neck pain)., Disp: 100 g, Rfl: 6   DULoxetine (CYMBALTA) 20 MG capsule, TAKE 1 CAPSULE BY MOUTH EVERY DAY, Disp: 90 capsule, Rfl: 0   gabapentin (NEURONTIN) 300 MG capsule, TAKE 1  CAPSULE BY MOUTH 3 TIMES DAILY, Disp: 270 capsule, Rfl: 0   Ginsengs-Saw Palmetto (MULTI GINSENG & SAW PALMETTO) 500 MG CAPS, Take 500 mg by mouth daily., Disp: , Rfl:    hydrochlorothiazide (HYDRODIURIL) 25 MG tablet, Take 1 tablet (25 mg total) by mouth daily as needed., Disp: 30 tablet, Rfl: 3   losartan (COZAAR) 100 MG tablet, TAKE 1 TABLET BY MOUTH DAILY ALONG WITH HCTZ 25MG TAB, Disp: 90 tablet, Rfl: 0   meclizine (ANTIVERT) 25 MG tablet, TAKE 1 TABLET BY MOUTH 3 TIMES DAILY AS NEEDED FOR DIZZINESS, Disp: 90 tablet, Rfl: 0   Multiple Vitamin (MULITIVITAMIN WITH MINERALS) TABS, Take 1 tablet by mouth daily., Disp: , Rfl:    mupirocin ointment (BACTROBAN) 2 %, Apply 1 application topically daily. With dressing changes, Disp: 22 g, Rfl: 0   omeprazole (PRILOSEC) 40 MG capsule, TAKE 1 CAPSULE (40 MG) BY MOUTH EVERY DAY, Disp: 90 capsule, Rfl: 1   simvastatin (ZOCOR) 40 MG tablet, Take 1 tablet (40 mg total) by mouth daily., Disp: 90 tablet, Rfl: 1   tadalafil (CIALIS) 20 MG tablet, Take 0.5-1 tablets (10-20 mg total) by mouth every other day as needed for erectile dysfunction.,  Disp: 5 tablet, Rfl: 11   tamsulosin (FLOMAX) 0.4 MG CAPS capsule, Take 1 capsule (0.4 mg total) by mouth daily., Disp: 90 capsule, Rfl: 1   zinc gluconate 50 MG tablet, Take by mouth., Disp: , Rfl:    gabapentin (NEURONTIN) 250 MG/5ML solution, Take by mouth. (Patient not taking: Reported on 01/20/2021), Disp: , Rfl:    Naproxen Sodium 220 MG CAPS, Take by mouth as needed.  (Patient not taking: Reported on 01/20/2021), Disp: , Rfl:   Physical exam:  Vitals:   01/20/21 1427  BP: (!) 150/74  Pulse: 62  Resp: 18  Temp: (!) 97.2 F (36.2 C)  SpO2: 99%  Weight: 188 lb 6.1 oz (85.5 kg)   Physical Exam Constitutional:      General: He is not in acute distress. Cardiovascular:     Rate and Rhythm: Normal rate and regular rhythm.     Heart sounds: Normal heart sounds.  Pulmonary:     Effort: Pulmonary effort is normal.     Breath sounds: Normal breath sounds.  Abdominal:     General: Bowel sounds are normal.     Palpations: Abdomen is soft.     Comments: Spleen is palpable about 2 cm below costal margin  Lymphadenopathy:     Comments: No palpable cervical, supraclavicular, axillary or inguinal adenopathy    Skin:    General: Skin is warm and dry.  Neurological:     Mental Status: He is alert and oriented to person, place, and time.     CMP Latest Ref Rng & Units 01/20/2021  Glucose 70 - 99 mg/dL 131(H)  BUN 8 - 23 mg/dL 39(H)  Creatinine 0.61 - 1.24 mg/dL 1.29(H)  Sodium 135 - 145 mmol/L 138  Potassium 3.5 - 5.1 mmol/L 4.3  Chloride 98 - 111 mmol/L 107  CO2 22 - 32 mmol/L 23  Calcium 8.9 - 10.3 mg/dL 8.5(L)  Total Protein 6.5 - 8.1 g/dL 6.5  Total Bilirubin 0.3 - 1.2 mg/dL 1.0  Alkaline Phos 38 - 126 U/L 94  AST 15 - 41 U/L 25  ALT 0 - 44 U/L 23   CBC Latest Ref Rng & Units 01/20/2021  WBC 4.0 - 10.5 K/uL 2.9(L)  Hemoglobin 13.0 - 17.0 g/dL 8.3(L)  Hematocrit 39.0 - 52.0 %  25.7(L)  Platelets 150 - 400 K/uL 204     Assessment and plan- Patient is a 80 y.o. male [place]  myelofibrosis currently under observation here for routine follow-up  Patient's hemoglobin since 2018 was between 9-10.  Since this year it has gradually drifted down to 8 and presently 8.3/25.7.  I did repeat an anemia work-up today including ferritin levels which are normal at 199 with an iron saturation of 22%.  B12 levels normal at 653.  TSH mildly elevated at 4.8 but I doubt that that is contributing to his anemia.  He does have some component of CKD as well with a creatinine that fluctuates between 1.2-1.6 and has not changed overall in the last 3 to 4 years.  LDH levels remain mildly elevated between 300s to 400s.  I suspect his anemia is a combination of myelofibrosis as well as ongoing CKD.  Given that it has overall drifted down presently to the 8's it would be reasonable to offer him a trial of people especially given that his iron saturation and ferritin do not indicate any evidence of iron deficiency  Before I start him on EPO I will reach out to Dr. Sabino Dick from [place] .  Last seen him in April 2022 as he was planning to refer the patient to malignant hematology as well for possible clinical trials.  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 Normocytic anemia   3. Anemia of chronic kidney failure, stage 3 (moderate) ([place])   4. Myelofibrosis ([place])   5. Vitamin B12 deficiency      Dr. Randa Evens, MD, MPH [facility] [phone] 01/24/2021 10:56 AM

## 2021-01-26 ENCOUNTER — Other Ambulatory Visit: Payer: Self-pay | Admitting: Cardiovascular Disease

## 2021-01-26 ENCOUNTER — Other Ambulatory Visit: Payer: Self-pay | Admitting: Family Medicine

## 2021-01-26 NOTE — Telephone Encounter (Signed)
   Notes to clinic: Requested script is expired  Review for continued use and refill    Requested Prescriptions  Pending Prescriptions Disp Refills   omeprazole (PRILOSEC) 40 MG capsule [Pharmacy Med Name: OMEPRAZOLE DR 40 MG CAP] 90 capsule 1    Sig: TAKE 1 CAPSULE BY MOUTH ONCE DAILY     Gastroenterology: Proton Pump Inhibitors Passed - 01/26/2021 11:22 AM      Passed - Valid encounter within last 12 months    Recent Outpatient Visits           3 months ago Mixed hyperlipidemia   [facility] Vigg, Avanti, MD   4 months ago ETD (Eustachian tube dysfunction), left   [employer], [place], DO   7 months ago [place] Rumball, Bryson Ha M, DO   8 months ago Benign hypertensive renal disease   [facility] Johnson, Megan P, DO   8 months ago Need for influenza vaccination   [employer], Megan P, DO       Future Appointments             In 5 months [place], [facility]             Signed Prescriptions Disp Refills   losartan (COZAAR) 100 MG tablet 90 tablet 0    Sig: TAKE 1 TABLET BY MOUTH DAILY ALONG WITH HCTZ '25MG'$  TAB     Cardiovascular:  Angiotensin Receptor Blockers Failed - 01/26/2021 11:22 AM      Failed - Cr in normal range and within 180 days    Creatinine  Date Value Ref Range Status  06/18/2014 1.09 0.60 - 1.30 mg/dL Final   Creatinine, Ser  Date Value Ref Range Status  01/20/2021 1.29 (H) 0.61 - 1.24 mg/dL Final          Failed - Last BP in normal range    BP Readings from Last 1 Encounters:  01/20/21 (!) 150/74          Passed - K in normal range and within 180 days    Potassium  Date Value Ref Range Status  01/20/2021 4.3 3.5 - 5.1 mmol/L Final  06/18/2014 4.5 3.5 - 5.1 mmol/L Final          Passed - Patient is not pregnant      Passed - Valid encounter within last 6 months    Recent Outpatient Visits           3 months ago Mixed  hyperlipidemia   [facility] Vigg, Avanti, MD   4 months ago ETD (Eustachian tube dysfunction), left   [employer], [place], DO   7 months ago [place], Alison M, DO   8 months ago Benign hypertensive renal disease   [facility] Johnson, Megan P, DO   8 months ago Need for influenza vaccination   [employer], Megan P, DO       Future Appointments             In 5 months [place], [facility]

## 2021-01-26 NOTE — Telephone Encounter (Signed)
Patient last seen in May.

## 2021-01-27 DIAGNOSIS — M9903 Segmental and somatic dysfunction of lumbar region: Secondary | ICD-10-CM | POA: Diagnosis not present

## 2021-01-27 DIAGNOSIS — M5033 Other cervical disc degeneration, cervicothoracic region: Secondary | ICD-10-CM | POA: Diagnosis not present

## 2021-01-27 DIAGNOSIS — M5412 Radiculopathy, cervical region: Secondary | ICD-10-CM | POA: Diagnosis not present

## 2021-01-27 DIAGNOSIS — M62830 Muscle spasm of back: Secondary | ICD-10-CM | POA: Diagnosis not present

## 2021-01-29 ENCOUNTER — Telehealth: Payer: Self-pay | Admitting: *Deleted

## 2021-01-29 NOTE — Telephone Encounter (Signed)
I left message with nursing triage and  left message fro Dr. Sabino Dick to call Dr. Janese Banks at [facility] at [phone] on this pt. Also left pt info . And left my number if for some reason they can't get in touch with Janese Banks on her cell phone.

## 2021-02-01 NOTE — Telephone Encounter (Signed)
As per wife this is the 3rd time patient had to [facility] his injection appointment because a written note was not received by [place] [facility]  reflecting d/c clopidogrel (PLAVIX) 75 MG tablet. Patient new appointment is scheduled for  Sept 8th. Caller does not have the fax # to [place] [facility] and specialist can not take a verbal. Caller would like a follow up call when done today.

## 2021-02-01 NOTE — Telephone Encounter (Signed)
I will sign today. They never told me that they needed a note.

## 2021-02-01 NOTE — Telephone Encounter (Signed)
Form was received Friday for Dr. Wynetta Emery to sign. Form was placed in her folder to be signed today upon her return to the office.

## 2021-02-02 NOTE — Telephone Encounter (Signed)
Form signed and faxed back to [facility].

## 2021-02-03 DIAGNOSIS — M5033 Other cervical disc degeneration, cervicothoracic region: Secondary | ICD-10-CM | POA: Diagnosis not present

## 2021-02-03 DIAGNOSIS — M5412 Radiculopathy, cervical region: Secondary | ICD-10-CM | POA: Diagnosis not present

## 2021-02-03 DIAGNOSIS — M9903 Segmental and somatic dysfunction of lumbar region: Secondary | ICD-10-CM | POA: Diagnosis not present

## 2021-02-03 DIAGNOSIS — M62830 Muscle spasm of back: Secondary | ICD-10-CM | POA: Diagnosis not present

## 2021-02-04 ENCOUNTER — Other Ambulatory Visit: Payer: Self-pay | Admitting: Cardiovascular Disease

## 2021-02-10 ENCOUNTER — Other Ambulatory Visit: Payer: Self-pay | Admitting: Family Medicine

## 2021-02-10 DIAGNOSIS — M5412 Radiculopathy, cervical region: Secondary | ICD-10-CM | POA: Diagnosis not present

## 2021-02-10 DIAGNOSIS — M62830 Muscle spasm of back: Secondary | ICD-10-CM | POA: Diagnosis not present

## 2021-02-10 DIAGNOSIS — M9903 Segmental and somatic dysfunction of lumbar region: Secondary | ICD-10-CM | POA: Diagnosis not present

## 2021-02-10 DIAGNOSIS — M5033 Other cervical disc degeneration, cervicothoracic region: Secondary | ICD-10-CM | POA: Diagnosis not present

## 2021-02-10 NOTE — Telephone Encounter (Signed)
Requested medication (s) are due for refill today -yes  Requested medication (s) are on the active medication list -yes  Future visit scheduled -yes  Last refill: 11/05/20  Notes to clinic: Request RF- non delegated Rx  Requested Prescriptions  Pending Prescriptions Disp Refills   meclizine (ANTIVERT) 25 MG tablet [Pharmacy Med Name: MECLIZINE HCL 25 MG TAB] 90 tablet 0    Sig: TAKE 1 TABLET BY MOUTH 3 TIMES DAILY AS NEEDED FOR DIZZINESS     Not Delegated - Gastroenterology: Antiemetics Failed - 02/10/2021  8:28 AM      Failed - This refill cannot be delegated      Passed - Valid encounter within last 6 months    Recent Outpatient Visits           3 months ago Mixed hyperlipidemia   [facility] Vigg, Avanti, MD   4 months ago ETD (Eustachian tube dysfunction), left   [facility] [place], [place], DO   7 months ago [place] Rumball, Bryson Ha M, DO   8 months ago Benign hypertensive renal disease   [facility] Johnson, Megan P, DO   9 months ago Need for influenza vaccination   [employer], Megan P, DO       Future Appointments             In 5 months [place], [facility]                Requested Prescriptions  Pending Prescriptions Disp Refills   meclizine (ANTIVERT) 25 MG tablet [Pharmacy Med Name: MECLIZINE HCL 25 MG TAB] 90 tablet 0    Sig: TAKE 1 TABLET BY MOUTH 3 TIMES DAILY AS NEEDED FOR DIZZINESS     Not Delegated - Gastroenterology: Antiemetics Failed - 02/10/2021  8:28 AM      Failed - This refill cannot be delegated      Passed - Valid encounter within last 6 months    Recent Outpatient Visits           3 months ago Mixed hyperlipidemia   [facility] Vigg, Avanti, MD   4 months ago ETD (Eustachian tube dysfunction), left   [place], [place], DO   7 months ago COVID-19   [employer], Bryson Ha M, DO    8 months ago Benign hypertensive renal disease   [facility] Johnson, Megan P, DO   9 months ago Need for influenza vaccination   [employer], Megan P, DO       Future Appointments             In 5 months [place], [facility]

## 2021-02-11 DIAGNOSIS — M5412 Radiculopathy, cervical region: Secondary | ICD-10-CM | POA: Diagnosis not present

## 2021-02-24 DIAGNOSIS — M62830 Muscle spasm of back: Secondary | ICD-10-CM | POA: Diagnosis not present

## 2021-02-24 DIAGNOSIS — M5412 Radiculopathy, cervical region: Secondary | ICD-10-CM | POA: Diagnosis not present

## 2021-02-24 DIAGNOSIS — M9903 Segmental and somatic dysfunction of lumbar region: Secondary | ICD-10-CM | POA: Diagnosis not present

## 2021-02-24 DIAGNOSIS — M5033 Other cervical disc degeneration, cervicothoracic region: Secondary | ICD-10-CM | POA: Diagnosis not present

## 2021-03-02 ENCOUNTER — Inpatient Hospital Stay: Payer: Medicare Other | Attending: Internal Medicine

## 2021-03-02 ENCOUNTER — Other Ambulatory Visit: Payer: Self-pay

## 2021-03-02 DIAGNOSIS — D474 Osteomyelofibrosis: Secondary | ICD-10-CM | POA: Diagnosis not present

## 2021-03-02 LAB — CBC WITH DIFFERENTIAL/PLATELET
Abs Immature Granulocytes: 0.27 10*3/uL — ABNORMAL HIGH (ref 0.00–0.07)
Basophils Absolute: 0 10*3/uL (ref 0.0–0.1)
Basophils Relative: 1 %
Eosinophils Absolute: 0 10*3/uL (ref 0.0–0.5)
Eosinophils Relative: 0 %
HCT: 28.8 % — ABNORMAL LOW (ref 39.0–52.0)
Hemoglobin: 9.3 g/dL — ABNORMAL LOW (ref 13.0–17.0)
Immature Granulocytes: 9 %
Lymphocytes Relative: 24 %
Lymphs Abs: 0.7 10*3/uL (ref 0.7–4.0)
MCH: 30.1 pg (ref 26.0–34.0)
MCHC: 32.3 g/dL (ref 30.0–36.0)
MCV: 93.2 fL (ref 80.0–100.0)
Monocytes Absolute: 0.5 10*3/uL (ref 0.1–1.0)
Monocytes Relative: 15 %
Neutro Abs: 1.6 10*3/uL — ABNORMAL LOW (ref 1.7–7.7)
Neutrophils Relative %: 51 %
Platelets: 263 10*3/uL (ref 150–400)
RBC Morphology: NONE SEEN
RBC: 3.09 MIL/uL — ABNORMAL LOW (ref 4.22–5.81)
RDW: 17.8 % — ABNORMAL HIGH (ref 11.5–15.5)
WBC: 3.1 10*3/uL — ABNORMAL LOW (ref 4.0–10.5)
nRBC: 1.6 % — ABNORMAL HIGH (ref 0.0–0.2)

## 2021-03-02 LAB — COMPREHENSIVE METABOLIC PANEL
ALT: 26 U/L (ref 0–44)
AST: 24 U/L (ref 15–41)
Albumin: 3.7 g/dL (ref 3.5–5.0)
Alkaline Phosphatase: 85 U/L (ref 38–126)
Anion gap: 5 (ref 5–15)
BUN: 35 mg/dL — ABNORMAL HIGH (ref 8–23)
CO2: 23 mmol/L (ref 22–32)
Calcium: 8.5 mg/dL — ABNORMAL LOW (ref 8.9–10.3)
Chloride: 108 mmol/L (ref 98–111)
Creatinine, Ser: 1.5 mg/dL — ABNORMAL HIGH (ref 0.61–1.24)
GFR, Estimated: 47 mL/min — ABNORMAL LOW (ref >60)
Glucose, Bld: 123 mg/dL — ABNORMAL HIGH (ref 70–99)
Potassium: 4.7 mmol/L (ref 3.5–5.1)
Sodium: 136 mmol/L (ref 135–145)
Total Bilirubin: 1.2 mg/dL (ref 0.3–1.2)
Total Protein: 6.1 g/dL — ABNORMAL LOW (ref 6.5–8.1)

## 2021-03-02 LAB — LACTATE DEHYDROGENASE: LDH: 430 U/L — ABNORMAL HIGH (ref 98–192)

## 2021-03-03 ENCOUNTER — Telehealth: Payer: Self-pay | Admitting: *Deleted

## 2021-03-03 ENCOUNTER — Other Ambulatory Visit: Payer: Medicare Other

## 2021-03-03 NOTE — Telephone Encounter (Signed)
Wanted the pt to know that Dr. Janese Banks had called Dr. Sabino Dick and the doctor agreed with Dr. Janese Banks that it is good idea to start retacrit injections.  These are the injections to hopefully help with his hemoglobin to get better number. I did see another number   --[phone]. I was able to leave the message on that voicemail . Also asked him to call me back. I left my direct phone number

## 2021-03-05 ENCOUNTER — Telehealth: Payer: Self-pay | Admitting: *Deleted

## 2021-03-05 NOTE — Telephone Encounter (Signed)
-----   Message from Sindy Guadeloupe, MD sent at 03/02/2021  4:13 PM EDT ----- Spoke to dr Ky Barban bitar from [facility]. She agrees with proceeding with epo. Can you speak to patient and see if he is agreeable? Retacrit Q3 weeks

## 2021-03-05 NOTE — Telephone Encounter (Signed)
Called pt again and told him that dr Janese Banks has got in touch with MD at [facility] Dr. Sabino Dick and both doctors are agreeable that pt could benefit from getting retacrit inj. Pt is agreeable to get them. Next week tues afternoon is good for him. I told him the scheduler will  call him with it. I then called him back and said that we have to get the insurance to approved it and then call him . Pt says for [place] when we get it set up.

## 2021-03-10 ENCOUNTER — Other Ambulatory Visit: Payer: Self-pay | Admitting: Oncology

## 2021-03-12 ENCOUNTER — Other Ambulatory Visit: Payer: Self-pay

## 2021-03-12 DIAGNOSIS — D474 Osteomyelofibrosis: Secondary | ICD-10-CM

## 2021-03-16 ENCOUNTER — Inpatient Hospital Stay: Payer: Medicare Other

## 2021-03-16 ENCOUNTER — Inpatient Hospital Stay: Payer: Medicare Other | Attending: Oncology

## 2021-03-16 ENCOUNTER — Other Ambulatory Visit: Payer: Self-pay | Admitting: Family Medicine

## 2021-03-16 ENCOUNTER — Other Ambulatory Visit: Payer: Self-pay

## 2021-03-16 VITALS — BP 147/80 | HR 66 | Temp 97.0°F | Resp 18

## 2021-03-16 DIAGNOSIS — D630 Anemia in neoplastic disease: Secondary | ICD-10-CM | POA: Diagnosis not present

## 2021-03-16 DIAGNOSIS — D7581 Myelofibrosis: Secondary | ICD-10-CM | POA: Diagnosis present

## 2021-03-16 DIAGNOSIS — D631 Anemia in chronic kidney disease: Secondary | ICD-10-CM | POA: Diagnosis not present

## 2021-03-16 DIAGNOSIS — D474 Osteomyelofibrosis: Secondary | ICD-10-CM

## 2021-03-16 DIAGNOSIS — N183 Chronic kidney disease, stage 3 unspecified: Secondary | ICD-10-CM | POA: Diagnosis not present

## 2021-03-16 DIAGNOSIS — R162 Hepatomegaly with splenomegaly, not elsewhere classified: Secondary | ICD-10-CM | POA: Diagnosis not present

## 2021-03-16 DIAGNOSIS — D709 Neutropenia, unspecified: Secondary | ICD-10-CM | POA: Diagnosis not present

## 2021-03-16 DIAGNOSIS — D471 Chronic myeloproliferative disease: Secondary | ICD-10-CM | POA: Diagnosis not present

## 2021-03-16 DIAGNOSIS — D649 Anemia, unspecified: Secondary | ICD-10-CM

## 2021-03-16 LAB — HEMOGLOBIN: Hemoglobin: 8.9 g/dL — ABNORMAL LOW (ref 13.0–17.0)

## 2021-03-16 LAB — LACTATE DEHYDROGENASE: LDH: 405 U/L — ABNORMAL HIGH (ref 98–192)

## 2021-03-16 LAB — HEMATOCRIT: HCT: 27.5 % — ABNORMAL LOW (ref 39.0–52.0)

## 2021-03-16 MED ORDER — EPOETIN ALFA-EPBX 40000 UNIT/ML IJ SOLN
40000.0000 [IU] | INTRAMUSCULAR | Status: DC
  Administered 2021-03-16: 40000 [IU] via SUBCUTANEOUS

## 2021-03-16 NOTE — Telephone Encounter (Signed)
Requested Prescriptions  Pending Prescriptions Disp Refills  . DULoxetine (CYMBALTA) 20 MG capsule [Pharmacy Med Name: DULOXETINE HCL 20 MG CAP] 90 capsule 0    Sig: TAKE 1 CAPSULE BY MOUTH EVERY DAY     Psychiatry: Antidepressants - SNRI Failed - 03/16/2021 12:44 PM      Failed - Last BP in normal range    BP Readings from Last 1 Encounters:  03/16/21 (!) 147/80         Passed - Valid encounter within last 6 months    Recent Outpatient Visits          4 months ago Mixed hyperlipidemia   [facility] Vigg, Avanti, MD   5 months ago ETD (Eustachian tube dysfunction), left   [facility] [place], [place], DO   8 months ago [place] Rory Percy M, DO   9 months ago Benign hypertensive renal disease   [employer], Megan P, DO   10 months ago Need for influenza vaccination   [place], DO      Future Appointments            In 3 months [place], [facility]            . gabapentin (NEURONTIN) 300 MG capsule [Pharmacy Med Name: GABAPENTIN 300 MG CAP] 270 capsule 0    Sig: TAKE 1 [place]     Neurology: Anticonvulsants - gabapentin Passed - 03/16/2021 12:44 PM      Passed - Valid encounter within last 12 months    Recent Outpatient Visits          4 months ago Mixed hyperlipidemia   [facility] Vigg, Avanti, MD   5 months ago ETD (Eustachian tube dysfunction), left   [place], [place], DO   8 months ago [place], DO   9 months ago Benign hypertensive renal disease   [facility] Johnson, Megan P, DO   10 months ago Need for influenza vaccination   [facility] [place], Megan P, DO      Future Appointments            In 3 months [place], [place]

## 2021-03-17 ENCOUNTER — Inpatient Hospital Stay: Payer: Medicare Other

## 2021-03-17 DIAGNOSIS — M62830 Muscle spasm of back: Secondary | ICD-10-CM | POA: Diagnosis not present

## 2021-03-17 DIAGNOSIS — M9903 Segmental and somatic dysfunction of lumbar region: Secondary | ICD-10-CM | POA: Diagnosis not present

## 2021-03-17 DIAGNOSIS — M5033 Other cervical disc degeneration, cervicothoracic region: Secondary | ICD-10-CM | POA: Diagnosis not present

## 2021-03-17 DIAGNOSIS — M5412 Radiculopathy, cervical region: Secondary | ICD-10-CM | POA: Diagnosis not present

## 2021-03-18 ENCOUNTER — Encounter: Payer: Self-pay | Admitting: Oncology

## 2021-03-25 ENCOUNTER — Other Ambulatory Visit: Payer: Self-pay

## 2021-03-25 ENCOUNTER — Ambulatory Visit (INDEPENDENT_AMBULATORY_CARE_PROVIDER_SITE_OTHER): Payer: Medicare Other | Admitting: Nurse Practitioner

## 2021-03-25 ENCOUNTER — Ambulatory Visit: Payer: Self-pay | Admitting: *Deleted

## 2021-03-25 ENCOUNTER — Encounter: Payer: Self-pay | Admitting: Nurse Practitioner

## 2021-03-25 VITALS — BP 134/74 | HR 71 | Temp 98.6°F | Wt 189.0 lb

## 2021-03-25 DIAGNOSIS — B029 Zoster without complications: Secondary | ICD-10-CM | POA: Diagnosis not present

## 2021-03-25 MED ORDER — VALACYCLOVIR HCL 1 G PO TABS: 1000.0000 mg | ORAL_TABLET | Freq: Three times a day (TID) | ORAL | 0 refills | Status: AC

## 2021-03-25 NOTE — Telephone Encounter (Signed)
Calls with pus-filled small bumps noticed this am on the inner left arm from the elbow to the hand. Bumps are red and plentiful. does not itch and is not painful.  Is not warm to touch. Denies any fever or other symptoms recently. No international travels, [place] to [place]. Now noticing several small red bumps around the naval and to the right of it. Patient is unavailable to come in this am. Scheduled 4:00p appointment with Ander Purpura today. Care including keep covered for now and wear a mask while in the public this am.     Reason for Disposition  [1] Shingles rash AND [2] spots start appearing other places on body  Answer Assessment - Initial Assessment Questions 1. APPEARANCE of RASH: "Describe the rash."      Small red pus-filled bumps on the inner left arm from elbow to the hand. 2. LOCATION: "Where is the rash located?"      Left inner arm 3. NUMBER: "How many spots are there?"      Too many to count 4. SIZE: "How big are the spots?" (Inches, centimeters or compare to size of a coin)      Smaller than an eraser 5. ONSET: "When did the rash start?"      today 6. ITCHING: "Does the rash itch?" If Yes, ask: "How bad is the itch?"  (Scale 0-10; or none, mild, moderate, severe)     none 7. PAIN: "Does the rash hurt?" If Yes, ask: "How bad is the pain?"  (Scale 0-10; or none, mild, moderate, severe)    - NONE (0): no pain    - MILD (1-3): doesn't interfere with normal activities     - MODERATE (4-7): interferes with normal activities or awakens from sleep     - SEVERE (8-10): excruciating pain, unable to do any normal activities     0 8. OTHER SYMPTOMS: "Do you have any other symptoms?" (e.g., fever)     no 9. PREGNANCY: "Is there any chance you are pregnant?" "When was your last menstrual period?"     na  Protocols used: Rash or Redness - Localized-A-AH, Shingles (Zoster)-A-AH

## 2021-03-25 NOTE — Progress Notes (Signed)
Acute Office Visit  Subjective:    Patient ID: [name], male    DOB: 1941/02/15, 80 y.o.   MRN: [id number]  Chief Complaint  Patient presents with   Rash    Rash on lower left arm. Noticed this morning.    HPI Patient is in today for a rash on his left lower arm which started today.   RASH  Duration:   1 day   Location:  left arm   Itching: no Burning: no Redness: yes Oozing: yes Scaling: no Blisters: yes Painful: no Fevers: no Change in detergents/soaps/personal care products: no Recent illness: no Recent travel:no History of same: no Context: worse Alleviating factors: nothing Treatments attempted:nothing Shortness of breath: no  Throat/tongue swelling: no Myalgias/arthralgias: no   Past Medical History:  Diagnosis Date   Arthritis    Benign hypertensive renal disease    GERD (gastroesophageal reflux disease)    Heartburn    History of retinal detachment    Hyperlipidemia    Hypertension    Melanoma ([place])    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 approximately 10-15 years ago   Myelofibrosis ([place])    Prostate hypertrophy    Stroke [facility]) 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;  Service: Ophthalmology;  Laterality: Right;  [id number]   SCLERAL BUCKLE  08/11/2011   Procedure: SCLERAL BUCKLE;  Surgeon: Hayden Pedro, MD;  Location: [place];  Service: Ophthalmology;  Laterality: Right;   VARICOSE VEIN SURGERY      Family History  Problem Relation Age of Onset   Heart disease Father    Diabetes Son    Kidney disease Neg Hx    Prostate cancer Neg Hx     Social History   Socioeconomic History   Marital status: Married    Spouse name: Parke Simmers    Number of children: 2   Years of education: 12+    Highest education level: Some college, no degree  Occupational History   Occupation: Education officer, 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   Caffeine Use- 2 cups daily   Patient has 2 children.    Patient has some college.    Patient is right handed.          Works full time   Scientist, physiological Strain: Low Risk    Difficulty of Paying Living Expenses: Not hard at all  Food Insecurity: No Food Insecurity   Worried About Charity fundraiser in the Last Year: Never true  Ran Out of Food in the Last Year: Never true  Transportation Needs: No Transportation Needs   Lack of Transportation (Medical): No   Lack of Transportation (Non-Medical): No  Physical Activity: Inactive   Days of Exercise per Week: 0 days   Minutes of Exercise per Session: 0 min  Stress: No Stress Concern Present   Feeling of Stress : Not at all  Social Connections: Not on file  Intimate Partner Violence: Not on file    Outpatient Medications Prior to Visit  Medication Sig Dispense Refill   Cholecalciferol (D3-1000 PO) Take by mouth daily.     clopidogrel (PLAVIX) 75 MG tablet TAKE ONE TABLET BY MOUTH EVERY DAY 90 tablet 1   diclofenac sodium (VOLTAREN) 1 % GEL Apply 4 g topically 4 (four) times daily as needed (for neck pain). 100 g 6   DULoxetine (CYMBALTA) 20 MG capsule TAKE 1 CAPSULE BY MOUTH EVERY DAY 90 capsule 0   gabapentin (NEURONTIN) 300 MG capsule TAKE 1 CAPSULE BY MOUTH THREE TIMES  DAILY 270 capsule 0   Ginsengs-Saw Palmetto (MULTI GINSENG & SAW PALMETTO) 500 MG CAPS Take 500 mg by mouth daily.     hydrochlorothiazide (HYDRODIURIL) 25 MG tablet Take 1 tablet (25 mg total) by mouth daily as needed. 30 tablet 3   losartan (COZAAR) 100 MG tablet TAKE 1 TABLET BY MOUTH DAILY ALONG WITH HCTZ 25MG  TAB 90 tablet 0   meclizine (ANTIVERT) 25 MG tablet TAKE 1 TABLET BY MOUTH 3 TIMES DAILY AS NEEDED FOR DIZZINESS 90 tablet 0   Multiple Vitamin (MULITIVITAMIN WITH MINERALS) TABS Take 1 tablet by mouth daily.     omeprazole (PRILOSEC) 40 MG capsule TAKE 1 CAPSULE BY MOUTH ONCE DAILY 90 capsule 0   simvastatin (ZOCOR) 40 MG tablet Take 1 tablet (40 mg total) by mouth daily. 90 tablet 1   tamsulosin (FLOMAX) 0.4 MG CAPS capsule Take 1 capsule (0.4 mg total) by mouth daily. 90 capsule 1   zinc gluconate 50 MG tablet Take by mouth.     azelastine (ASTELIN) 0.1 % nasal spray Place 1 spray into both nostrils 2 (two) times daily. Use in each nostril as directed 30 mL 0   calcium carbonate (OSCAL) 1500 (600 Ca) MG TABS tablet Take by mouth.     gabapentin (NEURONTIN) 250 MG/5ML solution Take by mouth.     mupirocin ointment (BACTROBAN) 2 % Apply 1 application topically daily. With dressing changes 22 g 0   Naproxen Sodium 220 MG CAPS Take by mouth as needed.     tadalafil (CIALIS) 20 MG tablet Take 0.5-1 tablets (10-20 mg total) by mouth every other day as needed for erectile dysfunction. 5 tablet 11   No facility-administered medications prior to visit.    Allergies  Allergen Reactions   Meloxicam Nausea And Vomiting    Review of Systems  Constitutional: Negative.   Respiratory: Negative.    Cardiovascular: Negative.   Genitourinary: Negative.   Musculoskeletal: Negative.   Skin:  Positive for rash.  Neurological: Negative.       Objective:    Physical Exam Vitals and nursing note reviewed.  Constitutional:      Appearance: Normal appearance.  HENT:     Head:  Normocephalic.  Eyes:     Conjunctiva/sclera: Conjunctivae normal.  Cardiovascular:     Rate and Rhythm: Normal rate.     Pulses: Normal pulses.  Pulmonary:     Effort: Pulmonary effort is normal.  Musculoskeletal:     Cervical  back: Normal range of motion.  Skin:    General: Skin is warm.     Findings: Rash present.     Comments: Multiple blisters to left lower arm  Neurological:     General: No focal deficit present.     Mental Status: He is alert and oriented to person, place, and time.  Psychiatric:        Mood and Affect: Mood normal.        Behavior: Behavior normal.        Thought Content: Thought content normal.        Judgment: Judgment normal.    BP 134/74   Pulse 71   Temp 98.6 F (37 C)   Wt 189 lb (85.7 kg)   SpO2 98%   BMI 27.12 kg/m  Wt Readings from Last 3 Encounters:  03/25/21 189 lb (85.7 kg)  01/20/21 188 lb 6.1 oz (85.5 kg)  10/20/20 187 lb 6 oz (85 kg)    Health Maintenance Due  Topic Date Due   Zoster Vaccines- Shingrix (1 of 2) Never done   COVID-19 Vaccine (3 - Pfizer risk series) 09/19/2019   INFLUENZA VACCINE  01/04/2021    There are no preventive care reminders to display for this patient.   Lab Results  Component Value Date   TSH 4.871 (H) 01/20/2021   Lab Results  Component Value Date   WBC 3.1 (L) 03/02/2021   HGB 8.9 (L) 03/16/2021   HCT 27.5 (L) 03/16/2021   MCV 93.2 03/02/2021   PLT 263 03/02/2021   Lab Results  Component Value Date   NA 136 03/02/2021   K 4.7 03/02/2021   CO2 23 03/02/2021   GLUCOSE 123 (H) 03/02/2021   BUN 35 (H) 03/02/2021   CREATININE 1.50 (H) 03/02/2021   BILITOT 1.2 03/02/2021   ALKPHOS 85 03/02/2021   AST 24 03/02/2021   ALT 26 03/02/2021   PROT 6.1 (L) 03/02/2021   ALBUMIN 3.7 03/02/2021   CALCIUM 8.5 (L) 03/02/2021   ANIONGAP 5 03/02/2021   Lab Results  Component Value Date   CHOL 124 05/25/2020   Lab Results  Component Value Date   HDL 29 (L) 05/25/2020   Lab Results   Component Value Date   LDLCALC 70 05/25/2020   Lab Results  Component Value Date   TRIG 139 05/25/2020   Lab Results  Component Value Date   CHOLHDL 2.8 12/10/2012   Lab Results  Component Value Date   HGBA1C  11/21/2007    5.9 (NOTE)   The ADA recommends the following therapeutic goals for glycemic   control related to Hgb A1C measurement:   Goal of Therapy:   < 7.0% Hgb A1C   Action Suggested:  > 8.0% Hgb A1C   Ref:  Diabetes Care, 22, Suppl. 1, 1999       Assessment & Plan:   Problem List Items Addressed This Visit   None Visit Diagnoses     Herpes zoster without complication    -  Primary   Will treat with valacyclovir 1,000mg  TID x7 days. Wash with soap and water and pat dry. F/U with concerns or worsening symtpoms.    Relevant Medications   valACYclovir (VALTREX) 1000 MG tablet        Meds ordered this encounter  Medications   valACYclovir (VALTREX) 1000 MG tablet    Sig: Take 1 tablet (1,000 mg total) by mouth 3 (three) times daily for 7 days.    Dispense:  21  tablet    Refill:  0      Charyl Dancer, NP

## 2021-03-31 DIAGNOSIS — M9903 Segmental and somatic dysfunction of lumbar region: Secondary | ICD-10-CM | POA: Diagnosis not present

## 2021-03-31 DIAGNOSIS — M5033 Other cervical disc degeneration, cervicothoracic region: Secondary | ICD-10-CM | POA: Diagnosis not present

## 2021-03-31 DIAGNOSIS — M5412 Radiculopathy, cervical region: Secondary | ICD-10-CM | POA: Diagnosis not present

## 2021-03-31 DIAGNOSIS — M62830 Muscle spasm of back: Secondary | ICD-10-CM | POA: Diagnosis not present

## 2021-04-14 ENCOUNTER — Encounter: Payer: Self-pay | Admitting: Oncology

## 2021-04-14 ENCOUNTER — Other Ambulatory Visit: Payer: Self-pay

## 2021-04-14 ENCOUNTER — Other Ambulatory Visit: Payer: Medicare Other

## 2021-04-14 ENCOUNTER — Ambulatory Visit: Payer: Medicare Other | Admitting: Oncology

## 2021-04-14 ENCOUNTER — Inpatient Hospital Stay (HOSPITAL_BASED_OUTPATIENT_CLINIC_OR_DEPARTMENT_OTHER): Payer: Medicare Other | Admitting: Oncology

## 2021-04-14 ENCOUNTER — Inpatient Hospital Stay: Payer: Medicare Other

## 2021-04-14 ENCOUNTER — Inpatient Hospital Stay: Payer: Medicare Other | Attending: Oncology

## 2021-04-14 VITALS — BP 135/84 | HR 69 | Temp 98.8°F | Resp 18 | Wt 180.0 lb

## 2021-04-14 DIAGNOSIS — D7581 Myelofibrosis: Secondary | ICD-10-CM

## 2021-04-14 DIAGNOSIS — D649 Anemia, unspecified: Secondary | ICD-10-CM | POA: Diagnosis not present

## 2021-04-14 DIAGNOSIS — D474 Osteomyelofibrosis: Secondary | ICD-10-CM

## 2021-04-14 DIAGNOSIS — D473 Essential (hemorrhagic) thrombocythemia: Secondary | ICD-10-CM | POA: Diagnosis present

## 2021-04-14 LAB — CBC WITH DIFFERENTIAL/PLATELET
Abs Immature Granulocytes: 0.58 10*3/uL — ABNORMAL HIGH (ref 0.00–0.07)
Basophils Absolute: 0 10*3/uL (ref 0.0–0.1)
Basophils Relative: 1 %
Eosinophils Absolute: 0 10*3/uL (ref 0.0–0.5)
Eosinophils Relative: 0 %
HCT: 27.5 % — ABNORMAL LOW (ref 39.0–52.0)
Hemoglobin: 8.8 g/dL — ABNORMAL LOW (ref 13.0–17.0)
Immature Granulocytes: 17 %
Lymphocytes Relative: 18 %
Lymphs Abs: 0.6 10*3/uL — ABNORMAL LOW (ref 0.7–4.0)
MCH: 29.9 pg (ref 26.0–34.0)
MCHC: 32 g/dL (ref 30.0–36.0)
MCV: 93.5 fL (ref 80.0–100.0)
Monocytes Absolute: 0.5 10*3/uL (ref 0.1–1.0)
Monocytes Relative: 16 %
Neutro Abs: 1.6 10*3/uL — ABNORMAL LOW (ref 1.7–7.7)
Neutrophils Relative %: 48 %
Platelets: 237 10*3/uL (ref 150–400)
RBC: 2.94 MIL/uL — ABNORMAL LOW (ref 4.22–5.81)
RDW: 17.3 % — ABNORMAL HIGH (ref 11.5–15.5)
Smear Review: NORMAL
WBC: 3.4 10*3/uL — ABNORMAL LOW (ref 4.0–10.5)
nRBC: 2.1 % — ABNORMAL HIGH (ref 0.0–0.2)

## 2021-04-14 LAB — COMPREHENSIVE METABOLIC PANEL
ALT: 20 U/L (ref 0–44)
AST: 25 U/L (ref 15–41)
Albumin: 3.4 g/dL — ABNORMAL LOW (ref 3.5–5.0)
Alkaline Phosphatase: 108 U/L (ref 38–126)
Anion gap: 5 (ref 5–15)
BUN: 25 mg/dL — ABNORMAL HIGH (ref 8–23)
CO2: 27 mmol/L (ref 22–32)
Calcium: 8.1 mg/dL — ABNORMAL LOW (ref 8.9–10.3)
Chloride: 107 mmol/L (ref 98–111)
Creatinine, Ser: 1.23 mg/dL (ref 0.61–1.24)
GFR, Estimated: 59 mL/min — ABNORMAL LOW (ref >60)
Glucose, Bld: 106 mg/dL — ABNORMAL HIGH (ref 70–99)
Potassium: 4.4 mmol/L (ref 3.5–5.1)
Sodium: 139 mmol/L (ref 135–145)
Total Bilirubin: 0.6 mg/dL (ref 0.3–1.2)
Total Protein: 6 g/dL — ABNORMAL LOW (ref 6.5–8.1)

## 2021-04-14 MED ORDER — EPOETIN ALFA-EPBX 40000 UNIT/ML IJ SOLN
40000.0000 [IU] | INTRAMUSCULAR | Status: AC
  Administered 2021-04-14: 40000 [IU] via SUBCUTANEOUS
  Filled 2021-04-14: qty 1

## 2021-04-14 NOTE — Progress Notes (Signed)
Dr. Sabino Dick at [facility] is informing pt he is needing his injection every week and will like to dicuss with Dr. Janese Banks.

## 2021-04-15 DIAGNOSIS — M5412 Radiculopathy, cervical region: Secondary | ICD-10-CM | POA: Diagnosis not present

## 2021-04-15 DIAGNOSIS — M5033 Other cervical disc degeneration, cervicothoracic region: Secondary | ICD-10-CM | POA: Diagnosis not present

## 2021-04-15 DIAGNOSIS — M9903 Segmental and somatic dysfunction of lumbar region: Secondary | ICD-10-CM | POA: Diagnosis not present

## 2021-04-15 DIAGNOSIS — M62830 Muscle spasm of back: Secondary | ICD-10-CM | POA: Diagnosis not present

## 2021-04-16 ENCOUNTER — Encounter: Payer: Self-pay | Admitting: Oncology

## 2021-04-16 NOTE — Progress Notes (Signed)
Hematology/Oncology Consult note [facility]  [phone] [phone]  Patient Care Team: [name], DO as PCP - General (Family Medicine) Rockey Situ Kathlene November, MD as PCP - Cardiology (Cardiology) Pieter Partridge, MD as Referring Physician (Hematology) Lequita Asal, MD (Inactive) as Referring Physician (Hematology and Oncology) Hayden Pedro, MD as Consulting Physician (Ophthalmology) Garvin Fila, MD as Consulting Physician (Neurology) Festus Aloe, MD as Consulting Physician (Urology) Rockey Situ Kathlene November, MD as Consulting Physician (Cardiology) Abbie Sons, MD (Urology) Sindy Guadeloupe, MD as Consulting Physician (Hematology and Oncology)   Name of the patient: [name]  [id number]  05-21-1941   Date of visit: 04/16/21  Diagnosis- primary myelofibrosis   Chief complaint/ Reason for visit-routine follow-up of myelofibrosis  Heme/Onc history: Patient is a 80 year old male diagnosed with primary myelofibrosis back in 2016.  At that time he was found to have a mild splenomegaly of 15.8 cm.DIPPS score is 62 (age 57- 1, hemoglobin less than 10- 2) and score of 4 if 1% circulating blasts included from 07/17/2014.   Bone marrow on 06/11/2014 was most consistent with primary myelofibrosis.  Bone marrow biopsy showed 1% abnormal cells: CD45+, CD5+, CD10, CD11c+/-, CD19+, CD2-+, (dim), CD22+ (dim, CD23+, CD38-/+, FMC7-, HLA-DR+, sig lambda+(dim).  Blasts were not increased 1.2%; hypercellular for age: 24%; JAK2 V617F mutation was negative.  CALR mutation positive.  Flow cytometry included about 1% CLL/SLL phenotype cells (MM3+) of uncertain significance and some infiltrate into the marrow with increased atypical megakaryocytes.  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  MDS FISH panel was negative.   Patient also follows up with [facility] benign hematology Dr. Sabino Dick for his anemia.   Patient's hemoglobin was drifting down to the eights and was started on EPO in October 2022.  Interval history-patient is doing well for his age and continues to be active and does a full-time job.  He reports some baseline fatigue but denies other complaints at this time  ECOG PS- 1 Pain scale- 0   Review of systems- Review of Systems  Constitutional:  Positive for malaise/fatigue. Negative for chills, fever and weight loss.  HENT:  Negative for congestion, ear discharge and nosebleeds.   Eyes:  Negative for blurred vision.  Respiratory:  Negative for cough, hemoptysis, sputum production, shortness of breath and wheezing.   Cardiovascular:  Negative for chest pain, palpitations, orthopnea and claudication.  Gastrointestinal:  Negative for abdominal pain, blood in stool, constipation, diarrhea, heartburn, melena, nausea and vomiting.  Genitourinary:  Negative for dysuria, flank pain, frequency, hematuria and urgency.  Musculoskeletal:  Negative for back pain, joint pain and myalgias.  Skin:  Negative for rash.  Neurological:  Negative for dizziness, tingling, focal weakness, seizures, weakness and headaches.  Endo/Heme/Allergies:  Does not bruise/bleed easily.  Psychiatric/Behavioral:  Negative for depression and suicidal ideas. The patient does not have insomnia.      Allergies  Allergen Reactions   Meloxicam Nausea And Vomiting     Past Medical History:  Diagnosis Date   Arthritis    Benign hypertensive renal disease    GERD (gastroesophageal reflux disease)    Heartburn    History of retinal detachment    Hyperlipidemia    Hypertension    Melanoma ([place])    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 approximately 10-15 years ago   Myelofibrosis [facility])    Prostate hypertrophy    Stroke [facility]) June 2009   R brain  subcortical infarct     Past Surgical History:  Procedure Laterality Date   ASPIRATION / INJECTION RENAL CYST  Feb 2017   BACK SURGERY     approx 20- 25 years  ago   GAS INSERTION  08/11/2011   Procedure: INSERTION OF GAS;  Surgeon: Hayden Pedro, MD;  Location: [place];  Service: Ophthalmology;  Laterality: Right;  [id number]   SCLERAL BUCKLE  08/11/2011   Procedure: SCLERAL BUCKLE;  Surgeon: Hayden Pedro, MD;  Location: [place];  Service: Ophthalmology;  Laterality: Right;   VARICOSE VEIN SURGERY      Social History   Socioeconomic History   Marital status: Married    Spouse name: Parke Simmers    Number of children: 2   Years of education: 12+   Highest education level: Some college, no degree  Occupational History   Occupation: Education officer, community: OTHER    Comment: community   Occupation: SELF EMPLOYED    Employer: SELF EMPLOYED  Tobacco Use   Smoking status: Former    Packs/day: 0.25    Types: Cigarettes    Quit date: 06/06/2010    Years since quitting: 10.8   Smokeless tobacco: Never  Vaping Use   Vaping Use: Never used  Substance and Sexual Activity   Alcohol use: No    Alcohol/week: 0.0 standard drinks   Drug use: No   Sexual activity: Not on file  Other Topics Concern   Not on file  Social History Narrative   Pt lives at home with his family.   Caffeine Use- 2 cups daily   Patient has 2 children.    Patient has some college.    Patient is right handed.          Works full time   Scientist, physiological Strain: Low Risk    Difficulty of Paying Living Expenses: Not hard at all  Food Insecurity: No Food Insecurity   Worried About Charity fundraiser in the Last Year: Never true   Arboriculturist in the Last Year: Never true  Transportation Needs: No Transportation Needs   Lack of Transportation (Medical): No   Lack of Transportation (Non-Medical): No  Physical Activity: Inactive   Days of Exercise per Week: 0 days   Minutes of Exercise per Session: 0 min  Stress: No Stress Concern Present   Feeling of Stress : Not at all  Social Connections: Not on file  Intimate Partner Violence: Not on file     Family History  Problem Relation Age of Onset   Heart disease Father    Diabetes Son    Kidney disease Neg Hx    Prostate cancer Neg Hx      Current Outpatient Medications:    azelastine (ASTELIN) 0.1 % nasal spray, Place 1 spray into both nostrils 2 (two) times daily. Use in each nostril as directed, Disp: 30 mL, Rfl: 0   calcium carbonate (OSCAL) 1500 (600 Ca) MG TABS tablet, Take by mouth., Disp: , Rfl:    Cholecalciferol (D3-1000 PO), Take by mouth daily., Disp: , Rfl:    clopidogrel (PLAVIX) 75 MG tablet, TAKE ONE TABLET BY MOUTH EVERY DAY, Disp: 90 tablet, Rfl: 1   DULoxetine (CYMBALTA) 20 MG capsule, TAKE 1 CAPSULE BY MOUTH EVERY DAY, Disp: 90 capsule, Rfl: 0   gabapentin (NEURONTIN) 300 MG capsule, TAKE 1 CAPSULE BY MOUTH THREE TIMES DAILY, Disp: 270 capsule, Rfl: 0   Ginsengs-Saw Palmetto (  MULTI GINSENG & SAW PALMETTO) 500 MG CAPS, Take 500 mg by mouth daily., Disp: , Rfl:    hydrochlorothiazide (HYDRODIURIL) 25 MG tablet, Take 1 tablet (25 mg total) by mouth daily as needed., Disp: 30 tablet, Rfl: 3   losartan (COZAAR) 100 MG tablet, TAKE 1 TABLET BY MOUTH DAILY ALONG WITH HCTZ 25MG TAB, Disp: 90 tablet, Rfl: 0   Multiple Vitamin (MULITIVITAMIN WITH MINERALS) TABS, Take 1 tablet by mouth daily., Disp: , Rfl:    Naproxen Sodium 220 MG CAPS, Take by mouth as needed., Disp: , Rfl:    omeprazole (PRILOSEC) 40 MG capsule, TAKE 1 CAPSULE BY MOUTH ONCE DAILY, Disp: 90 capsule, Rfl: 0   simvastatin (ZOCOR) 40 MG tablet, Take 1 tablet (40 mg total) by mouth daily., Disp: 90 tablet, Rfl: 1   tadalafil (CIALIS) 20 MG tablet, Take 0.5-1 tablets (10-20 mg total) by mouth every other day as needed for erectile dysfunction., Disp: 5 tablet, Rfl: 11   tamsulosin (FLOMAX) 0.4 MG CAPS capsule, Take 1 capsule (0.4 mg total) by mouth daily., Disp: 90 capsule, Rfl: 1   zinc gluconate 50 MG tablet, Take by mouth., Disp: , Rfl:    diclofenac sodium (VOLTAREN) 1 % GEL, Apply 4 g topically 4 (four)  times daily as needed (for neck pain). (Patient not taking: Reported on 04/14/2021), Disp: 100 g, Rfl: 6   meclizine (ANTIVERT) 25 MG tablet, TAKE 1 TABLET BY MOUTH 3 TIMES DAILY AS NEEDED FOR DIZZINESS (Patient not taking: Reported on 04/14/2021), Disp: 90 tablet, Rfl: 0   mupirocin ointment (BACTROBAN) 2 %, Apply 1 application topically daily. With dressing changes (Patient not taking: Reported on 04/14/2021), Disp: 22 g, Rfl: 0 No current facility-administered medications for this visit.  Facility-Administered Medications Ordered in Other Visits:    epoetin alfa-epbx (RETACRIT) injection 40,000 Units, 40,000 Units, Subcutaneous, Weekly, Sindy Guadeloupe, MD, 40,000 Units at 04/14/21 1406  Physical exam:  Vitals:   04/14/21 1318  BP: 135/84  Pulse: 69  Resp: 18  Temp: 98.8 F (37.1 C)  SpO2: 98%  Weight: 180 lb (81.6 kg)   Physical Exam Constitutional:      General: He is not in acute distress. Cardiovascular:     Rate and Rhythm: Normal rate and regular rhythm.     Heart sounds: Normal heart sounds.  Pulmonary:     Effort: Pulmonary effort is normal.     Breath sounds: Normal breath sounds.  Abdominal:     General: Bowel sounds are normal.     Palpations: Abdomen is soft.  Skin:    General: Skin is warm and dry.  Neurological:     Mental Status: He is alert and oriented to person, place, and time.     CMP Latest Ref Rng & Units 04/14/2021  Glucose 70 - 99 mg/dL 106(H)  BUN 8 - 23 mg/dL 25(H)  Creatinine 0.61 - 1.24 mg/dL 1.23  Sodium 135 - 145 mmol/L 139  Potassium 3.5 - 5.1 mmol/L 4.4  Chloride 98 - 111 mmol/L 107  CO2 22 - 32 mmol/L 27  Calcium 8.9 - 10.3 mg/dL 8.1(L)  Total Protein 6.5 - 8.1 g/dL 6.0(L)  Total Bilirubin 0.3 - 1.2 mg/dL 0.6  Alkaline Phos 38 - 126 U/L 108  AST 15 - 41 U/L 25  ALT 0 - 44 U/L 20   CBC Latest Ref Rng & Units 04/14/2021  WBC 4.0 - 10.5 K/uL 3.4(L)  Hemoglobin 13.0 - 17.0 g/dL 8.8(L)  Hematocrit 39.0 - 52.0 %  27.5(L)  Platelets 150 -  400 K/uL 237   Assessment and plan- Patient is a 80 y.o. male with primary myelofibrosis who was recently initiated Retacrit here for routine follow-up  Patient had his Retacrit dosing in October 2022 weeks what for unclear reasons did not receive subsequent dosing I will be restarting Retacrit at this time and he will be receiving 40,000 units weekly to keep a hemoglobin target of 10-11.  I have discussed his case with Dr. Sabino Dick from [facility] as well who is on board with the plan.  I will see him back in 3 months with CBC ferritin and iron studies for possible Retacrit. Again discussed risks and benefits of Retacrit including all the not limited to possible risk of thromboembolic events and the goal would be to keep his hemoglobin between 10-11.   Visit Diagnosis 1. Anemia, unspecified type   2. Myelofibrosis ([place])      Dr. Randa Evens, MD, MPH [facility] [phone] 04/16/2021 8:36 AM

## 2021-04-20 ENCOUNTER — Telehealth: Payer: Self-pay | Admitting: Oncology

## 2021-04-20 NOTE — Telephone Encounter (Signed)
Pt called in needing to move his appt to 11-17. Please call him at [phone]

## 2021-04-21 ENCOUNTER — Inpatient Hospital Stay: Payer: Medicare Other

## 2021-04-22 ENCOUNTER — Inpatient Hospital Stay: Payer: Medicare Other

## 2021-04-22 ENCOUNTER — Other Ambulatory Visit: Payer: Self-pay

## 2021-04-22 VITALS — BP 153/73 | HR 74

## 2021-04-22 DIAGNOSIS — D7581 Myelofibrosis: Secondary | ICD-10-CM | POA: Diagnosis not present

## 2021-04-22 DIAGNOSIS — D649 Anemia, unspecified: Secondary | ICD-10-CM

## 2021-04-22 DIAGNOSIS — D473 Essential (hemorrhagic) thrombocythemia: Secondary | ICD-10-CM | POA: Diagnosis not present

## 2021-04-22 LAB — HEMOGLOBIN AND HEMATOCRIT, BLOOD
HCT: 28.6 % — ABNORMAL LOW (ref 39.0–52.0)
Hemoglobin: 8.8 g/dL — ABNORMAL LOW (ref 13.0–17.0)

## 2021-04-22 MED ORDER — EPOETIN ALFA-EPBX 40000 UNIT/ML IJ SOLN
40000.0000 [IU] | INTRAMUSCULAR | Status: DC
  Administered 2021-04-22: 15:00:00 40000 [IU] via SUBCUTANEOUS
  Filled 2021-04-22: qty 1

## 2021-04-24 ENCOUNTER — Other Ambulatory Visit: Payer: Self-pay | Admitting: Family Medicine

## 2021-04-24 NOTE — Telephone Encounter (Signed)
30 day courtesy RF Requested Prescriptions  Pending Prescriptions Disp Refills  . simvastatin (ZOCOR) 40 MG tablet [Pharmacy Med Name: SIMVASTATIN 40 MG TAB] 30 tablet 0    Sig: TAKE ONE TABLET BY MOUTH DAILY     Cardiovascular:  Antilipid - Statins Failed - 04/24/2021  9:20 AM      Failed - HDL in normal range and within 360 days    HDL  Date Value Ref Range Status  05/25/2020 29 (L) >39 mg/dL Final         Passed - Total Cholesterol in normal range and within 360 days    Cholesterol, Total  Date Value Ref Range Status  05/25/2020 124 100 - 199 mg/dL Final   Cholesterol Piccolo, Waived  Date Value Ref Range Status  04/14/2016 106 <200 mg/dL Final    Comment:                            Desirable                <200                         Borderline High      200- 239                         High                     >239          Passed - LDL in normal range and within 360 days    LDL Chol Calc (NIH)  Date Value Ref Range Status  05/25/2020 70 0 - 99 mg/dL Final         Passed - Triglycerides in normal range and within 360 days    Triglycerides  Date Value Ref Range Status  05/25/2020 139 0 - 149 mg/dL Final   Triglycerides Piccolo,Waived  Date Value Ref Range Status  04/14/2016 99 <150 mg/dL Final    Comment:                            Normal                   <150                         Borderline High     150 - 199                         High                200 - 499                         Very High                >499          Passed - Patient is not pregnant      Passed - Valid encounter within last 12 months    Recent Outpatient Visits          1 month ago Herpes zoster without complication   [place], Lauren A, NP   5 months ago Mixed hyperlipidemia   Medical laboratory scientific officer,  [place], [place]   7 months ago ETD (Eustachian tube dysfunction), left   [place], [place], DO   10 months ago [place], [place]   11 months ago Benign hypertensive renal disease   [facility] [place], [place], DO      Future Appointments            In 2 months [facility], [place]

## 2021-04-28 ENCOUNTER — Inpatient Hospital Stay: Payer: Medicare Other

## 2021-04-28 DIAGNOSIS — M62830 Muscle spasm of back: Secondary | ICD-10-CM | POA: Diagnosis not present

## 2021-04-28 DIAGNOSIS — M5033 Other cervical disc degeneration, cervicothoracic region: Secondary | ICD-10-CM | POA: Diagnosis not present

## 2021-04-28 DIAGNOSIS — M5412 Radiculopathy, cervical region: Secondary | ICD-10-CM | POA: Diagnosis not present

## 2021-04-28 DIAGNOSIS — M9903 Segmental and somatic dysfunction of lumbar region: Secondary | ICD-10-CM | POA: Diagnosis not present

## 2021-05-05 ENCOUNTER — Other Ambulatory Visit: Payer: Self-pay | Admitting: Family Medicine

## 2021-05-05 ENCOUNTER — Other Ambulatory Visit: Payer: Self-pay | Admitting: Nurse Practitioner

## 2021-05-05 ENCOUNTER — Inpatient Hospital Stay: Payer: Medicare Other

## 2021-05-06 DIAGNOSIS — M66822 Spontaneous rupture of other tendons, left upper arm: Secondary | ICD-10-CM | POA: Diagnosis not present

## 2021-05-06 NOTE — Telephone Encounter (Signed)
Requested Prescriptions  Pending Prescriptions Disp Refills  . omeprazole (PRILOSEC) 40 MG capsule [Pharmacy Med Name: OMEPRAZOLE DR 40 MG CAP] 90 capsule 0    Sig: TAKE 1 CAPSULE BY MOUTH ONCE DAILY     Gastroenterology: Proton Pump Inhibitors Passed - 05/05/2021  8:55 AM      Passed - Valid encounter within last 12 months    Recent Outpatient Visits          1 month ago Herpes zoster without complication   [place], Lauren A, NP   6 months ago Mixed hyperlipidemia   [facility] [place], [place], MD   7 months ago ETD (Eustachian tube dysfunction), left   [place], [place], DO   10 months ago [place], DO   11 months ago Benign hypertensive renal disease   [facility] [place], Megan P, DO      Future Appointments            In 2 months [place], [place]

## 2021-05-06 NOTE — Telephone Encounter (Signed)
Requested Prescriptions  Pending Prescriptions Disp Refills  . tamsulosin (FLOMAX) 0.4 MG CAPS capsule [Pharmacy Med Name: TAMSULOSIN HCL 0.4 MG CAP] 90 capsule 0    Sig: TAKE 1 CAPSULE BY MOUTH DAILY     Urology: Alpha-Adrenergic Blocker Failed - 05/05/2021  8:55 AM      Failed - Last BP in normal range    BP Readings from Last 1 Encounters:  04/22/21 (!) 153/73         Passed - Valid encounter within last 12 months    Recent Outpatient Visits          1 month ago Herpes zoster without complication   [place], Lauren A, NP   6 months ago Mixed hyperlipidemia   [facility] [place], [place], MD   7 months ago ETD (Eustachian tube dysfunction), left   [place], [place], DO   10 months ago [place], DO   11 months ago Benign hypertensive renal disease   [facility] [place], Megan P, DO      Future Appointments            In 2 months [place], [place]

## 2021-05-11 ENCOUNTER — Ambulatory Visit (INDEPENDENT_AMBULATORY_CARE_PROVIDER_SITE_OTHER): Payer: Medicare Other

## 2021-05-11 ENCOUNTER — Other Ambulatory Visit: Payer: Self-pay

## 2021-05-11 DIAGNOSIS — Z23 Encounter for immunization: Secondary | ICD-10-CM | POA: Diagnosis not present

## 2021-05-12 ENCOUNTER — Inpatient Hospital Stay: Payer: Medicare Other

## 2021-05-12 ENCOUNTER — Inpatient Hospital Stay: Payer: Medicare Other | Attending: Oncology

## 2021-05-12 VITALS — BP 179/84

## 2021-05-12 DIAGNOSIS — M5412 Radiculopathy, cervical region: Secondary | ICD-10-CM | POA: Diagnosis not present

## 2021-05-12 DIAGNOSIS — D471 Chronic myeloproliferative disease: Secondary | ICD-10-CM | POA: Insufficient documentation

## 2021-05-12 DIAGNOSIS — D7581 Myelofibrosis: Secondary | ICD-10-CM

## 2021-05-12 DIAGNOSIS — M62830 Muscle spasm of back: Secondary | ICD-10-CM | POA: Diagnosis not present

## 2021-05-12 DIAGNOSIS — M5033 Other cervical disc degeneration, cervicothoracic region: Secondary | ICD-10-CM | POA: Diagnosis not present

## 2021-05-12 DIAGNOSIS — M9903 Segmental and somatic dysfunction of lumbar region: Secondary | ICD-10-CM | POA: Diagnosis not present

## 2021-05-12 DIAGNOSIS — D649 Anemia, unspecified: Secondary | ICD-10-CM

## 2021-05-12 LAB — HEMOGLOBIN AND HEMATOCRIT, BLOOD
HCT: 30.4 % — ABNORMAL LOW (ref 39.0–52.0)
Hemoglobin: 9.5 g/dL — ABNORMAL LOW (ref 13.0–17.0)

## 2021-05-12 MED ORDER — EPOETIN ALFA-EPBX 40000 UNIT/ML IJ SOLN
40000.0000 [IU] | INTRAMUSCULAR | Status: DC
  Administered 2021-05-12: 40000 [IU] via SUBCUTANEOUS
  Filled 2021-05-12: qty 1

## 2021-05-13 ENCOUNTER — Ambulatory Visit: Payer: Self-pay | Admitting: *Deleted

## 2021-05-13 ENCOUNTER — Ambulatory Visit (INDEPENDENT_AMBULATORY_CARE_PROVIDER_SITE_OTHER): Payer: Medicare Other | Admitting: Nurse Practitioner

## 2021-05-13 ENCOUNTER — Other Ambulatory Visit: Payer: Self-pay

## 2021-05-13 ENCOUNTER — Encounter: Payer: Self-pay | Admitting: Nurse Practitioner

## 2021-05-13 VITALS — BP 181/82 | HR 66 | Temp 98.4°F

## 2021-05-13 DIAGNOSIS — R059 Cough, unspecified: Secondary | ICD-10-CM | POA: Diagnosis not present

## 2021-05-13 DIAGNOSIS — R161 Splenomegaly, not elsewhere classified: Secondary | ICD-10-CM | POA: Diagnosis not present

## 2021-05-13 DIAGNOSIS — Z79899 Other long term (current) drug therapy: Secondary | ICD-10-CM | POA: Diagnosis not present

## 2021-05-13 DIAGNOSIS — D649 Anemia, unspecified: Secondary | ICD-10-CM | POA: Diagnosis not present

## 2021-05-13 DIAGNOSIS — Z8673 Personal history of transient ischemic attack (TIA), and cerebral infarction without residual deficits: Secondary | ICD-10-CM | POA: Diagnosis not present

## 2021-05-13 DIAGNOSIS — Z7902 Long term (current) use of antithrombotics/antiplatelets: Secondary | ICD-10-CM | POA: Diagnosis not present

## 2021-05-13 DIAGNOSIS — M549 Dorsalgia, unspecified: Secondary | ICD-10-CM | POA: Diagnosis not present

## 2021-05-13 DIAGNOSIS — M25512 Pain in left shoulder: Secondary | ICD-10-CM | POA: Diagnosis not present

## 2021-05-13 DIAGNOSIS — N281 Cyst of kidney, acquired: Secondary | ICD-10-CM | POA: Diagnosis not present

## 2021-05-13 DIAGNOSIS — E785 Hyperlipidemia, unspecified: Secondary | ICD-10-CM | POA: Diagnosis not present

## 2021-05-13 DIAGNOSIS — R519 Headache, unspecified: Secondary | ICD-10-CM | POA: Diagnosis not present

## 2021-05-13 DIAGNOSIS — I10 Essential (primary) hypertension: Secondary | ICD-10-CM | POA: Diagnosis not present

## 2021-05-13 DIAGNOSIS — D72819 Decreased white blood cell count, unspecified: Secondary | ICD-10-CM | POA: Diagnosis not present

## 2021-05-13 DIAGNOSIS — R079 Chest pain, unspecified: Secondary | ICD-10-CM

## 2021-05-13 DIAGNOSIS — R0789 Other chest pain: Secondary | ICD-10-CM | POA: Diagnosis not present

## 2021-05-13 DIAGNOSIS — I440 Atrioventricular block, first degree: Secondary | ICD-10-CM | POA: Diagnosis not present

## 2021-05-13 DIAGNOSIS — Z20822 Contact with and (suspected) exposure to covid-19: Secondary | ICD-10-CM | POA: Diagnosis not present

## 2021-05-13 DIAGNOSIS — R200 Anesthesia of skin: Secondary | ICD-10-CM | POA: Diagnosis not present

## 2021-05-13 DIAGNOSIS — M1990 Unspecified osteoarthritis, unspecified site: Secondary | ICD-10-CM | POA: Diagnosis not present

## 2021-05-13 DIAGNOSIS — M546 Pain in thoracic spine: Secondary | ICD-10-CM | POA: Diagnosis not present

## 2021-05-13 NOTE — Progress Notes (Signed)
Established Patient Office Visit  Subjective:  Patient ID: [name], male    DOB: 1941/01/07  Age: 80 y.o. MRN: [id number]  CC:  Chief Complaint  Patient presents with   Hypertension    Pt states he was seen at Oncology yesterday, BP was elevated and has been at home as well. States he has also been having pain in his chest, under his shoulder blade and some numbness and tingling in his arm. States these symptoms started yesterday.      HPI [name] presents for elevated blood pressure readings at home. His blood pressure was 179/84 yesterday at his epoetin infusion. This morning at home is blood pressure was 189/80. His granddaughter is accompanying him at this visit. He endorses intermittent chest pain, tingling down his left arm, although he does have a history of disc disease. He states he has been unsteady on his feet, having blurred vision intermittently, and headaches. He "doesn't feel right." He denies shortness of breath, diaphoresis, nausea and vomiting.    Past Medical History:  Diagnosis Date   Arthritis    Benign hypertensive renal disease    GERD (gastroesophageal reflux disease)    Heartburn    History of retinal detachment    Hyperlipidemia    Hypertension    Melanoma ([place])    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 approximately 10-15 years ago   Myelofibrosis [facility])    Prostate hypertrophy    Stroke [facility]) June 2009   R brain subcortical infarct    Past Surgical History:  Procedure Laterality Date   ASPIRATION / INJECTION RENAL CYST  Feb 2017   BACK SURGERY     approx 20- 25 years ago   GAS INSERTION  08/11/2011   Procedure: INSERTION OF GAS;  Surgeon: Hayden Pedro, MD;  Location: [place];  Service: Ophthalmology;  Laterality: Right;  [id number]   SCLERAL BUCKLE  08/11/2011   Procedure: SCLERAL BUCKLE;  Surgeon: Hayden Pedro, MD;  Location: [place];  Service: Ophthalmology;  Laterality: Right;   VARICOSE VEIN SURGERY      Family History  Problem  Relation Age of Onset   Heart disease Father    Diabetes Son    Kidney disease Neg Hx    Prostate cancer Neg Hx     Social History   Socioeconomic History   Marital status: Married    Spouse name: Parke Simmers    Number of children: 2   Years of education: 12+   Highest education level: Some college, no degree  Occupational History   Occupation: Education officer, 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   Caffeine Use- 2 cups daily   Patient has 2 children.    Patient has some college.    Patient is right handed.          Works full  time   Social Determinants of Health   Financial Resource Strain: Low Risk    Difficulty of Paying Living Expenses: Not hard at all  Food Insecurity: No Food Insecurity   Worried About Charity fundraiser in the Last Year: Never true   Arboriculturist in the Last Year: Never true  Transportation Needs: No Transportation Needs   Lack of Transportation (Medical): No   Lack of Transportation (Non-Medical): No  Physical Activity: Inactive   Days of Exercise per Week: 0 days   Minutes of Exercise per Session: 0 min  Stress: No Stress Concern Present   Feeling of Stress : Not at all  Social Connections: Not on file  Intimate Partner Violence: Not on file    Outpatient Medications Prior to Visit  Medication Sig Dispense Refill   azelastine (ASTELIN) 0.1 % nasal spray Place 1 spray into both nostrils 2 (two) times daily. Use in each nostril as directed 30 mL 0   calcium  carbonate (OSCAL) 1500 (600 Ca) MG TABS tablet Take by mouth.     Cholecalciferol (D3-1000 PO) Take by mouth daily.     clopidogrel (PLAVIX) 75 MG tablet TAKE ONE TABLET BY MOUTH EVERY DAY 90 tablet 1   DULoxetine (CYMBALTA) 20 MG capsule TAKE 1 CAPSULE BY MOUTH EVERY DAY 90 capsule 0   gabapentin (NEURONTIN) 300 MG capsule TAKE 1 CAPSULE BY MOUTH THREE TIMES DAILY 270 capsule 0   Ginsengs-Saw Palmetto (MULTI GINSENG & SAW PALMETTO) 500 MG CAPS Take 500 mg by mouth daily.     losartan (COZAAR) 100 MG tablet TAKE 1 TABLET BY MOUTH DAILY ALONG WITH HCTZ 25MG  TAB 90 tablet 0   meclizine (ANTIVERT) 25 MG tablet TAKE 1 TABLET BY MOUTH 3 TIMES DAILY AS NEEDED FOR DIZZINESS 90 tablet 0   Multiple Vitamin (MULITIVITAMIN WITH MINERALS) TABS Take 1 tablet by mouth daily.     Naproxen Sodium 220 MG CAPS Take by mouth as needed.     omeprazole (PRILOSEC) 40 MG capsule TAKE 1 CAPSULE BY MOUTH ONCE DAILY 90 capsule 0   simvastatin (ZOCOR) 40 MG tablet TAKE ONE TABLET BY MOUTH DAILY 30 tablet 0   tamsulosin (FLOMAX) 0.4 MG CAPS capsule TAKE 1 CAPSULE BY MOUTH DAILY 90 capsule 0   zinc gluconate 50 MG tablet Take by mouth.     hydrochlorothiazide (HYDRODIURIL) 25 MG tablet Take 1 tablet (25 mg total) by mouth daily as needed. (Patient not taking: Reported on 05/13/2021) 30 tablet 3   tadalafil (CIALIS) 20 MG tablet Take 0.5-1 tablets (10-20 mg total) by mouth every other day as needed for erectile dysfunction. (Patient not taking: Reported on 05/13/2021) 5 tablet 11   diclofenac sodium (VOLTAREN) 1 % GEL Apply 4 g topically 4 (four) times daily as needed (for neck pain). (Patient not taking: Reported on 04/14/2021) 100 g 6   mupirocin ointment (BACTROBAN) 2 % Apply 1 application topically daily. With dressing changes (Patient not taking: Reported on 04/14/2021) 22 g 0   Facility-Administered Medications Prior to Visit  Medication Dose Route Frequency Provider Last Rate Last Admin   epoetin alfa-epbx (RETACRIT)  injection 40,000 Units  40,000 Units Subcutaneous Weekly Sindy Guadeloupe, MD   40,000 Units at 04/14/21 1406    Allergies  Allergen Reactions   Meloxicam Nausea And Vomiting    ROS Review of Systems  Constitutional:  Positive for fatigue. Negative for fever.  HENT: Negative.    Eyes:  Positive for visual disturbance (intermittent).  Respiratory:  Negative for shortness of breath.   Cardiovascular:  Positive for chest pain (intermittent).  Gastrointestinal: Negative.   Genitourinary: Negative.   Musculoskeletal:  Positive for neck pain.  Skin: Negative.   Neurological:  Positive for headaches. Negative for dizziness.     Objective:    Physical Exam Vitals and nursing note reviewed.  Constitutional:      Appearance: Normal appearance.  HENT:     Head: Normocephalic.  Eyes:     Conjunctiva/sclera: Conjunctivae normal.     Pupils: Pupils are equal, round, and reactive to light.  Cardiovascular:     Rate and Rhythm: Normal rate and regular rhythm.     Pulses: Normal pulses.     Heart sounds: Normal heart sounds.  Pulmonary:     Effort: Pulmonary effort is normal.     Breath sounds: Normal breath sounds.  Musculoskeletal:     Cervical back: Normal range of motion.  Skin:    General: Skin is warm and dry.  Neurological:     General: No focal deficit present.     Mental Status: He is alert and oriented to person, place, and time.     Cranial Nerves: Cranial nerve deficit present.     Motor: Weakness (bilateral) present.     Coordination: Coordination normal.     Gait: Gait normal.  Psychiatric:        Mood and Affect: Mood normal.        Behavior: Behavior normal.        Thought Content: Thought content normal.        Judgment: Judgment normal.    BP (!) 181/82 (BP Location: Right Arm, Cuff Size: Normal)   Pulse 66   Temp 98.4 F (36.9 C) (Oral)   SpO2 98%  Wt Readings from Last 3 Encounters:  04/14/21 180 lb (81.6 kg)  03/25/21 189 lb (85.7 kg)  01/20/21 188  lb 6.1 oz (85.5 kg)     Health Maintenance Due  Topic Date Due   Zoster Vaccines- Shingrix (1 of 2) Never done   COVID-19 Vaccine (3 - Pfizer risk series) 09/19/2019    There are no preventive care reminders to display for this patient.  Lab Results  Component Value Date   TSH 4.871 (H) 01/20/2021   Lab Results  Component Value Date   WBC 3.4 (L) 04/14/2021   HGB 9.5 (L) 05/12/2021   HCT 30.4 (L) 05/12/2021   MCV 93.5 04/14/2021   PLT 237 04/14/2021   Lab Results  Component Value Date   NA 139 04/14/2021   K 4.4 04/14/2021   CO2 27 04/14/2021   GLUCOSE 106 (H) 04/14/2021   BUN 25 (H) 04/14/2021   CREATININE 1.23 04/14/2021   BILITOT 0.6 04/14/2021   ALKPHOS 108 04/14/2021   AST 25 04/14/2021   ALT 20 04/14/2021   PROT 6.0 (L) 04/14/2021   ALBUMIN 3.4 (L) 04/14/2021   CALCIUM 8.1 (L) 04/14/2021   ANIONGAP 5 04/14/2021   Lab Results  Component Value Date   CHOL 124 05/25/2020   Lab Results  Component Value Date   HDL 29 (L) 05/25/2020   Lab Results  Component Value Date   LDLCALC 70 05/25/2020   Lab Results  Component Value Date   TRIG 139 05/25/2020   Lab Results  Component Value Date   CHOLHDL 2.8 12/10/2012   Lab Results  Component Value Date   HGBA1C  11/21/2007    5.9 (NOTE)   The ADA recommends  the following therapeutic goals for glycemic   control related to Hgb A1C measurement:   Goal of Therapy:   < 7.0% Hgb A1C   Action Suggested:  > 8.0% Hgb A1C   Ref:  Diabetes Care, 22, Suppl. 1, 1999      Assessment & Plan:   Problem List Items Addressed This Visit   None Visit Diagnoses     Chest pain, unspecified type    -  Primary   With elevated blood pressure, intermittent chest pain, recommend evaluation at ER. Declines EMS transport. Granddaughter agrees with plan.    Relevant Orders   EKG 12-Lead (Completed)       No orders of the defined types were placed in this encounter.   Follow-up: Return if symptoms worsen or fail to  improve.    Charyl Dancer, NP

## 2021-05-13 NOTE — Telephone Encounter (Signed)
Pt's wife calling for pt, pt present. States BP at appt yesterday 195/80  this AM 189/80. States usually runs 120/60. No missed doses of meds. Reports headache 5/10 and dizziness. Wife states "Seems off balance, wobbly at times." Also reports dry cough. Unable to see availability within protocol time frame of 24 hours. Attempted to reach pt, on hold extended time. Assured pt and wife NT would route to practice for PCPs review and final disposition. CAre advise given per protocol, advised ED for worsening symptoms. verbalizes understanding.  Please advise: CB# is wife's   [phone]

## 2021-05-13 NOTE — Telephone Encounter (Signed)
Reason for Disposition  Systolic BP  >= 935 OR Diastolic >= 940  Answer Assessment - Initial Assessment Questions 1. BLOOD PRESSURE: "What is the blood pressure?" "Did you take at least two measurements 5 minutes apart?"     195/80 yesterday.  189/80 this AM 2. ONSET: "When did you take your blood pressure?"     Yesterday 3. HOW: "How did you obtain the blood pressure?" (e.g., visiting nurse, automatic home BP monitor)     *No Answer* 4. HISTORY: "Do you have a history of high blood pressure?"     *No Answer* 5. MEDICATIONS: "Are you taking any medications for blood pressure?" "Have you missed any doses recently?"     *No Answer* 6. OTHER SYMPTOMS: "Do you have any symptoms?" (e.g., headache, chest pain, blurred vision, difficulty breathing, weakness)     *No Answer*  Protocols used: Blood Pressure - High-A-AH

## 2021-05-13 NOTE — Patient Instructions (Signed)
I recommend evaluation at the ER for chest pain and elevated blood pressure.

## 2021-05-13 NOTE — Progress Notes (Signed)
Patients phone not taking call at this time will call 12/9/ to schedule an appointment for 1 week

## 2021-05-13 NOTE — Progress Notes (Signed)
EKG reviewed on 05/13/21 showed NSR with first degree heart block, heart rate 66. No ST or T wave changes.

## 2021-05-13 NOTE — Telephone Encounter (Signed)
noted 

## 2021-05-14 ENCOUNTER — Ambulatory Visit: Payer: Self-pay | Admitting: *Deleted

## 2021-05-14 ENCOUNTER — Telehealth: Payer: Self-pay | Admitting: Cardiovascular Disease

## 2021-05-14 ENCOUNTER — Encounter: Payer: Self-pay | Admitting: Family Medicine

## 2021-05-14 ENCOUNTER — Ambulatory Visit (INDEPENDENT_AMBULATORY_CARE_PROVIDER_SITE_OTHER): Payer: Medicare Other | Admitting: Family Medicine

## 2021-05-14 ENCOUNTER — Other Ambulatory Visit: Payer: Self-pay

## 2021-05-14 VITALS — BP 182/78 | HR 76 | Temp 97.9°F | Wt 188.0 lb

## 2021-05-14 DIAGNOSIS — I129 Hypertensive chronic kidney disease with stage 1 through stage 4 chronic kidney disease, or unspecified chronic kidney disease: Secondary | ICD-10-CM

## 2021-05-14 DIAGNOSIS — R161 Splenomegaly, not elsewhere classified: Secondary | ICD-10-CM | POA: Diagnosis not present

## 2021-05-14 MED ORDER — AMLODIPINE BESYLATE 2.5 MG PO TABS: 2.5000 mg | ORAL_TABLET | Freq: Every day | ORAL | 3 refills | Status: DC

## 2021-05-14 NOTE — Telephone Encounter (Signed)
Was able to reach back out to [name], he reports HTN over the past several days with headache and dizziness.  Today 188/89 Yesterday 205/98 Wednesday 195/89  Seen PCP yesterday who advised on ED visit, pt went to [facility] who did a complete workup and sent pt home, advised to take extra 100 mg Losartan for HTN.    Discharge Instructions - documented in this encounter Discharge Instructions [name] - 05/13/2021 3:21 PM EST   Formatting of this note might be different from the original. Thank you for letting us care for you today. You were seen in the Emergency Department (ED) today for evaluation of chest pain. Your chest x-ray, labs, and EKG were reassuring. Your CT scan did not show any signs of aortic dissection. As we discussed, you are most likely not having a heart attack today.   It is very important that you take you blood pressure medications at home.   Please follow up with your primary doctor in 2-3 days regarding today's ED visit. Please have a discussion with them about your blood pressure management. 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 Please speak with your primary doctor about these findings.   Return to the ED sooner if you develop worsening chest pain, worsening shortness of breath, coughing up blood, leg swelling, profuse sweating, you pass out, fever over 100.40F that is not improved with Tylenol, or any additional symptoms that concern you.   Electronically signed by [name] at 05/13/2021 6:11 PM EST  Advised pt on opening Monday 12/12 with Dr. Rockey Situ at 10:00 am, pt agrees to that appt, in the meantime monitor BP/HR, take his losartan 100 mg in the am, and can take another half dose to whole dose in the afternoon or evening to provide BP coverage until able to see Dr. Rockey Situ on Monday, [name] verbalized understanding and thankful for the return call.

## 2021-05-14 NOTE — Progress Notes (Signed)
BP (!) 182/78   Pulse 76   Temp 97.9 F (36.6 C)   Wt 188 lb (85.3 kg)   SpO2 96%   BMI 26.98 kg/m    Subjective:    Patient ID: [name], male    DOB: 04/28/1941, 80 y.o.   MRN: [id number]  HPI: [name] is a 80 y.o. male  Chief Complaint  Patient presents with   Hypertension    Follow up patient states BP is still running high, patient states he has had a headache for the past week    HYPERTENSION Hypertension status: exacerbated  Satisfied with current treatment? no Duration of hypertension: chronic BP monitoring frequency:  a few times a week BP range: 180s-200s/70s-100s BP medication side effects:  no Medication compliance: excellent compliance Previous BP meds: Aspirin: no Recurrent headaches: yes Visual changes: no Palpitations: yes Dyspnea: no Chest pain: yes Lower extremity edema: no Dizzy/lightheaded: no  ER FOLLOW UP Time since discharge: less than 1 day Hospital/facility: [facility] Diagnosis: HTN, splenomegaly Procedures/tests: Labs, CT head, CTA chest, CXR, EKG  Consultants: none New medications: none Discharge instructions: follow up here  Status: stable  Relevant past medical, surgical, family and social history reviewed and updated as indicated. Interim medical history since our last visit reviewed. Allergies and medications reviewed and updated.  Review of Systems  Constitutional: Negative.   Respiratory: Negative.    Cardiovascular: Negative.   Musculoskeletal: Negative.   Neurological:  Positive for headaches. Negative for dizziness, tremors, seizures, syncope, facial asymmetry, speech difficulty, weakness, light-headedness and numbness.  Psychiatric/Behavioral: Negative.     Per HPI unless specifically indicated above     Objective:    BP (!) 182/78   Pulse 76   Temp 97.9 F (36.6 C)   Wt 188 lb (85.3 kg)   SpO2 96%   BMI 26.98 kg/m   Wt Readings from Last 3 Encounters:  05/14/21 188 lb (85.3 kg)   04/14/21 180 lb (81.6 kg)  03/25/21 189 lb (85.7 kg)    Physical Exam Vitals and nursing note reviewed.  Constitutional:      General: He is not in acute distress.    Appearance: Normal appearance. He is not ill-appearing, toxic-appearing or diaphoretic.  HENT:     Head: Normocephalic and atraumatic.     Right Ear: External ear normal.     Left Ear: External ear normal.     Nose: Nose normal.     Mouth/Throat:     Mouth: Mucous membranes are moist.     Pharynx: Oropharynx is clear.  Eyes:     General: No scleral icterus.       Right eye: No discharge.        Left eye: No discharge.     Extraocular Movements: Extraocular movements intact.     Conjunctiva/sclera: Conjunctivae normal.     Pupils: Pupils are equal, round, and reactive to light.  Cardiovascular:     Rate and Rhythm: Normal rate and regular rhythm.     Pulses: Normal pulses.     Heart sounds: Normal heart sounds. No murmur heard.   No friction rub. No gallop.  Pulmonary:     Effort: Pulmonary effort is normal. No respiratory distress.     Breath sounds: Normal breath sounds. No stridor. No wheezing, rhonchi or rales.  Chest:     Chest wall: No tenderness.  Musculoskeletal:        General: Normal range of motion.     Cervical  back: Normal range of motion and neck supple.  Skin:    General: Skin is warm and dry.     Capillary Refill: Capillary refill takes less than 2 seconds.     Coloration: Skin is not jaundiced or pale.     Findings: No bruising, erythema, lesion or rash.  Neurological:     General: No focal deficit present.     Mental Status: He is alert and oriented to person, place, and time. Mental status is at baseline.  Psychiatric:        Mood and Affect: Mood normal.        Behavior: Behavior normal.        Thought Content: Thought content normal.        Judgment: Judgment normal.    Results for orders placed or performed in visit on 05/12/21  Hemoglobin and hematocrit, blood  Result Value  Ref Range   Hemoglobin 9.5 (L) 13.0 - 17.0 g/dL   HCT 30.4 (L) 39.0 - 52.0 %      Assessment & Plan:   Problem List Items Addressed This Visit       Genitourinary   Benign hypertensive renal disease    BP running high. Normal work up at the ER. Unclear why his BP shot up so quickly. No new meds, kidneys looked OK on CTA at [facility]. Will start him on 2.5mg  amlodipine and recheck 3 weeks. Seeing Cardiology in 3 days. Will start low due to normal diastolic. Titrate up as needed. Continue to monitor closely.      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 Continue to monitor.         Follow up plan: Return in about 3 weeks (around 06/04/2021), or for annual exam (not physical due to medicare).

## 2021-05-14 NOTE — Assessment & Plan Note (Signed)
BP running high. Normal work up at the ER. Unclear why his BP shot up so quickly. No new meds, kidneys looked OK on CTA at [facility]. Will start him on 2.5mg  amlodipine and recheck 3 weeks. Seeing Cardiology in 3 days. Will start low due to normal diastolic. Titrate up as needed. Continue to monitor closely.

## 2021-05-14 NOTE — Telephone Encounter (Signed)
Reason for Disposition  [6] Systolic BP  >= 060 OR Diastolic >= 045 AND [9] having NO cardiac or neurologic symptoms  Answer Assessment - Initial Assessment Questions 1. BLOOD PRESSURE: "What is the blood pressure?" "Did you take at least two measurements 5 minutes apart?"     195/89, 186/85 P 78 2. ONSET: "When did you take your blood pressure?"     9:15, 9:45 3. HOW: "How did you obtain the blood pressure?" (e.g., visiting nurse, automatic home BP monitor)     Automatic cuff- arm 4. HISTORY: "Do you have a history of high blood pressure?"     yes 5. MEDICATIONS: "Are you taking any medications for blood pressure?" "Have you missed any doses recently?"     Yes- no missed doses 6. OTHER SYMPTOMS: "Do you have any symptoms?" (e.g., headache, chest pain, blurred vision, difficulty breathing, weakness)     Headache, coughing, some dizziness 7. PREGNANCY: "Is there any chance you are pregnant?" "When was your last menstrual period?"     na  Protocols used: Blood Pressure - High-A-AH

## 2021-05-14 NOTE — Telephone Encounter (Signed)
  Chief Complaint: elevated BP Symptoms: headache Frequency: 2 days Pertinent Negatives: Patient denies chest pain Disposition: [] ED /[] Urgent Care (no appt availability in office) / [x] Appointment(In office/virtual)/ []  [place] [facility]/ [] Home Care/ [] Refused Recommended Disposition  Additional Notes: Patient was seen in office yesterday- sent to ED. Patient states he had testing: EKG, MRI- given medication and released home. Still has elevated BP

## 2021-05-14 NOTE — Telephone Encounter (Signed)
Pt c/o BP issue: STAT if pt c/o blurred vision, one-sided weakness or slurred speech  1. What are your last 5 BP readings?  Yesterday-205/98-patient went to [place], [place] Wednesday- 195/89  2. Are you having any other symptoms (ex. Dizziness, headache, blurred vision, passed out)? Dizziness and headache  3. What is your BP issue? Too high  Patient's wife states [facility] ran several tests, Chest xray, MRI, EKG. States he went to the cancer center on Wednesday and was unable to get his injection initially due to his high blood pressure. Please call to discuss.

## 2021-05-17 ENCOUNTER — Encounter: Payer: Self-pay | Admitting: Cardiovascular Disease

## 2021-05-17 ENCOUNTER — Other Ambulatory Visit: Payer: Self-pay

## 2021-05-17 ENCOUNTER — Ambulatory Visit (INDEPENDENT_AMBULATORY_CARE_PROVIDER_SITE_OTHER): Payer: Medicare Other | Admitting: Cardiovascular Disease

## 2021-05-17 VITALS — BP 166/70 | HR 85 | Ht 70.0 in | Wt 185.0 lb

## 2021-05-17 DIAGNOSIS — I129 Hypertensive chronic kidney disease with stage 1 through stage 4 chronic kidney disease, or unspecified chronic kidney disease: Secondary | ICD-10-CM

## 2021-05-17 DIAGNOSIS — I951 Orthostatic hypotension: Secondary | ICD-10-CM | POA: Diagnosis not present

## 2021-05-17 DIAGNOSIS — I639 Cerebral infarction, unspecified: Secondary | ICD-10-CM | POA: Diagnosis not present

## 2021-05-17 DIAGNOSIS — E782 Mixed hyperlipidemia: Secondary | ICD-10-CM

## 2021-05-17 MED ORDER — LOSARTAN POTASSIUM 100 MG PO TABS: 100.0000 mg | ORAL_TABLET | Freq: Every morning | ORAL | 3 refills | Status: DC

## 2021-05-17 MED ORDER — AMLODIPINE BESYLATE 10 MG PO TABS: 10.0000 mg | ORAL_TABLET | Freq: Every day | ORAL | 3 refills | Status: DC

## 2021-05-17 NOTE — Patient Instructions (Addendum)
Medication Instructions:  Please increase  amlodipine up to 10 mg daily take in the evening  Please take Losartan in the morning Please STOP HCTZ  If you need a refill on your cardiac medications before your next appointment, please call your pharmacy.   Lab work: No new labs needed  Testing/Procedures: No new testing needed  Follow-Up: At [facility], you and your health needs are our priority.  As part of our continuing mission to provide you with exceptional heart care, we have created designated Provider Care Teams.  These Care Teams include your primary Cardiologist (physician) and Advanced Practice Providers (APPs -  Physician Assistants and Nurse Practitioners) who all work together to provide you with the care you need, when you need it.  You will need a follow up appointment in 6 months  Providers on your designated Care Team:   Murray Hodgkins, NP Christell Faith, PA-C Cadence Kathlen Mody, [place]  COVID-19 Vaccine Information can be found at: [email or web address] For questions related to vaccine distribution or appointments, please email [email or web address] or call [phone].   Please monitor blood pressures and keep a log of your readings.  Call the clinic in 2 weeks with BP readings.   How to use a home blood pressure monitor. Be still. Measure at the same time every day. It's important to take the readings at the same time each day, such as morning and evening. Take reading approximately 1 1/2 to 2 hours after BP medications.   AVOID these things for 30 minutes before checking your blood pressure: Drinking caffeine. Drinking alcohol. Eating. Smoking. Exercising.

## 2021-05-17 NOTE — Progress Notes (Signed)
Evaluation Performed:  Follow-up visit  Date:  05/17/2021   ID:  [name], DOB [birth year], MRN [id number]  Patient Location:  [name] [id number]   Provider location:   [facility], [place] office  PCP:  [name], DO  Cardiologist:  [name]  Chief Complaint  Patient presents with   Follow-up    Was at [facility] ER chest pain & HTN. Medications reviewed by the patient verbally. Patient continues to have elevated blood pressure with headaches.      History of Present Illness:    [name] is a 80 y.o. male  past medical history of myelofibrosis diagnosed 06/11/2014.  long history of normocytic anemia unresponsive to oral iron. 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 CR 1.3  Had EKG, CXR,  CT scan: No aortic dissection as clinically questioned.  -- Splenomegaly consistent with reported history of myelofibrosis.   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   Past Medical History:  Diagnosis Date   Arthritis    Benign hypertensive renal disease    GERD (gastroesophageal reflux disease)    Heartburn    History of retinal detachment    Hyperlipidemia    Hypertension     Melanoma ([place])    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 approximately 10-15 years ago   Myelofibrosis ([place])    Prostate hypertrophy    Stroke [facility]) June 2009   R brain subcortical infarct   Past Surgical History:  Procedure Laterality Date   ASPIRATION / INJECTION RENAL CYST  Feb 2017   BACK SURGERY     approx 20- 25 years ago   GAS INSERTION  08/11/2011   Procedure: INSERTION OF GAS;  Surgeon: Hayden Pedro, MD;  Location: [place];  Service: Ophthalmology;  Laterality: Right;  [id number]   SCLERAL BUCKLE  08/11/2011   Procedure: SCLERAL BUCKLE;  Surgeon: Hayden Pedro, MD;  Location: [place];  Service: Ophthalmology;  Laterality: Right;   VARICOSE VEIN SURGERY       Current Meds  Medication Sig   azelastine (ASTELIN) 0.1 % nasal spray Place 1 spray into both nostrils 2 (two) times daily. Use in each nostril as directed   calcium carbonate (OSCAL) 1500 (600 Ca) MG TABS tablet Take by mouth.   Cholecalciferol (D3-1000 PO) Take by mouth daily.   clopidogrel (PLAVIX) 75 MG tablet TAKE ONE TABLET BY MOUTH EVERY DAY   DULoxetine (CYMBALTA) 20 MG capsule TAKE 1 CAPSULE BY MOUTH EVERY DAY   gabapentin (NEURONTIN) 300 MG capsule TAKE 1 CAPSULE BY MOUTH THREE TIMES DAILY   Ginsengs-Saw Palmetto (MULTI GINSENG & SAW PALMETTO) 500 MG CAPS Take 500 mg by mouth daily.   meclizine (  ANTIVERT) 25 MG tablet TAKE 1 TABLET BY MOUTH 3 TIMES DAILY AS NEEDED FOR DIZZINESS   Multiple Vitamin (MULITIVITAMIN WITH MINERALS) TABS Take 1 tablet by mouth daily.   Naproxen Sodium 220 MG CAPS Take by mouth as needed.   omeprazole (PRILOSEC) 40 MG capsule TAKE 1 CAPSULE BY MOUTH ONCE DAILY   simvastatin (ZOCOR) 40 MG tablet TAKE ONE TABLET BY MOUTH DAILY   tadalafil (CIALIS) 20 MG tablet Take 0.5-1 tablets (10-20 mg total) by mouth every other day as needed for erectile dysfunction.   tamsulosin (FLOMAX) 0.4 MG CAPS capsule TAKE 1 CAPSULE BY MOUTH DAILY   zinc gluconate 50 MG tablet Take by mouth.   [DISCONTINUED]  amLODipine (NORVASC) 2.5 MG tablet Take 1 tablet (2.5 mg total) by mouth daily.   [DISCONTINUED] losartan (COZAAR) 100 MG tablet TAKE 1 TABLET BY MOUTH DAILY ALONG WITH HCTZ 25MG  TAB     Allergies:   Meloxicam   Social History   Tobacco Use   Smoking status: Former    Packs/day: 0.25    Types: Cigarettes    Quit date: 06/06/2010    Years since quitting: 10.9   Smokeless tobacco: Never  Vaping Use   Vaping Use: Never used  Substance Use Topics   Alcohol use: No    Alcohol/week: 0.0 standard drinks   Drug use: No     Family Hx: The patient's family history includes Diabetes in his son; Heart disease in his father. There is no history of Kidney disease or Prostate cancer.  ROS:   Please see the history of present illness.    Review of Systems  Constitutional: Negative.   HENT: Negative.    Respiratory: Negative.    Cardiovascular: Negative.   Gastrointestinal: Negative.   Musculoskeletal: Negative.   Neurological: Negative.   Psychiatric/Behavioral: Negative.    All other systems reviewed and are negative.    Labs/Other Tests and Data Reviewed:    Recent Labs: 01/20/2021: TSH 4.871 04/14/2021: ALT 20; BUN 25; Creatinine, Ser 1.23; Platelets 237; Potassium 4.4; Sodium 139 05/12/2021: Hemoglobin 9.5   Recent Lipid Panel Lab Results  Component Value Date/Time   CHOL 124 05/25/2020 03:00 PM   CHOL 106 04/14/2016 08:58 AM   TRIG 139 05/25/2020 03:00 PM   TRIG 99 04/14/2016 08:58 AM   HDL 29 (L) 05/25/2020 03:00 PM   CHOLHDL 2.8 12/10/2012 03:22 PM   CHOLHDL 6.4 11/21/2007 03:25 AM   LDLCALC 70 05/25/2020 03:00 PM    Wt Readings from Last 3 Encounters:  05/17/21 185 lb (83.9 kg)  05/14/21 188 lb (85.3 kg)  04/14/21 180 lb (81.6 kg)     Exam:    BP (!) 166/70 (BP Location: Left Arm, Patient Position: Sitting, Cuff Size: Normal)   Pulse 85   Ht 5\' 10"  (1.778 m)   Wt 185 lb (83.9 kg)   SpO2 98%   BMI 26.54 kg/m  Constitutional:  oriented to person, place, and  time. No distress.  HENT:  Head: Grossly normal Eyes:  no discharge. No scleral icterus.  Neck: No JVD, no carotid bruits  Cardiovascular: Regular rate and rhythm, no murmurs appreciated Pulmonary/Chest: Clear to auscultation bilaterally, no wheezes or rails Abdominal: Soft.  no distension.  no tenderness.  Musculoskeletal: Normal range of motion Neurological:  normal muscle tone. Coordination normal. 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 No changes to the medications were made.  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, [place], [place] [address]

## 2021-05-19 ENCOUNTER — Other Ambulatory Visit: Payer: Self-pay

## 2021-05-19 ENCOUNTER — Inpatient Hospital Stay: Payer: Medicare Other

## 2021-05-19 ENCOUNTER — Other Ambulatory Visit: Payer: Self-pay | Admitting: Family Medicine

## 2021-05-19 ENCOUNTER — Inpatient Hospital Stay: Payer: Medicare Other | Attending: Oncology

## 2021-05-19 DIAGNOSIS — D649 Anemia, unspecified: Secondary | ICD-10-CM

## 2021-05-19 DIAGNOSIS — D471 Chronic myeloproliferative disease: Secondary | ICD-10-CM | POA: Diagnosis not present

## 2021-05-19 DIAGNOSIS — D7581 Myelofibrosis: Secondary | ICD-10-CM | POA: Diagnosis not present

## 2021-05-19 LAB — HEMOGLOBIN AND HEMATOCRIT, BLOOD
HCT: 33 % — ABNORMAL LOW (ref 39.0–52.0)
Hemoglobin: 10.3 g/dL — ABNORMAL LOW (ref 13.0–17.0)

## 2021-05-19 NOTE — Telephone Encounter (Signed)
Requested medication (s) are due for refill today - yes  Requested medication (s) are on the active medication list -yes  Future visit scheduled -yes  Last refill: 02/10/21 #90  Notes to clinic: Request RF: non delegated Rx  Requested Prescriptions  Pending Prescriptions Disp Refills   meclizine (ANTIVERT) 25 MG tablet [Pharmacy Med Name: MECLIZINE HCL 25 MG TAB] 90 tablet 0    Sig: TAKE 1 TABLET BY MOUTH 3 TIMES DAILY AS NEEDED FOR DIZZINESS     Not Delegated - Gastroenterology: Antiemetics Failed - 05/19/2021 10:15 AM      Failed - This refill cannot be delegated      Passed - Valid encounter within last 6 months    Recent Outpatient Visits           5 days ago Splenomegaly   [place], Megan P, DO   6 days ago Chest pain, unspecified type   [place], Lauren A, NP   1 month ago Herpes zoster without complication   [place], Lauren A, NP   6 months ago Mixed hyperlipidemia   [facility] V[facility], Avanti, MD   8 months ago ETD (Eustachian tube dysfunction), left   [employer], [place], DO       Future Appointments             In 2 weeks Wynetta Emery, Megan P, DO [employer], [facility]   In 1 month  [employer] [facility], [facility]   In 6 months Gollan, Kathlene November, MD [place], [facility]               Requested Prescriptions  Pending Prescriptions Disp Refills   meclizine (ANTIVERT) 25 MG tablet [Pharmacy Med Name: MECLIZINE HCL 25 MG TAB] 90 tablet 0    Sig: TAKE 1 TABLET BY MOUTH 3 TIMES DAILY AS NEEDED FOR DIZZINESS     Not Delegated - Gastroenterology: Antiemetics Failed - 05/19/2021 10:15 AM      Failed - This refill cannot be delegated      Passed - Valid encounter within last 6 months    Recent Outpatient Visits           5 days ago Splenomegaly   [place], Megan P, DO   6 days ago Chest pain, unspecified  type   [place], Lauren A, NP   1 month ago Herpes zoster without complication   [place], Lauren A, NP   6 months ago Mixed hyperlipidemia   [facility] V[facility], Avanti, MD   8 months ago ETD (Eustachian tube dysfunction), left   [employer], [place], DO       Future Appointments             In 2 weeks Wynetta Emery, Barb Merino, DO [employer], [place]   In 1 month  [employer], [place]   In 6 months Gollan, Kathlene November, MD [place], [place]

## 2021-05-26 ENCOUNTER — Inpatient Hospital Stay: Payer: Medicare Other

## 2021-05-26 ENCOUNTER — Other Ambulatory Visit: Payer: Self-pay

## 2021-05-26 DIAGNOSIS — M5412 Radiculopathy, cervical region: Secondary | ICD-10-CM | POA: Diagnosis not present

## 2021-05-26 DIAGNOSIS — M62830 Muscle spasm of back: Secondary | ICD-10-CM | POA: Diagnosis not present

## 2021-05-26 DIAGNOSIS — M9903 Segmental and somatic dysfunction of lumbar region: Secondary | ICD-10-CM | POA: Diagnosis not present

## 2021-05-26 DIAGNOSIS — D471 Chronic myeloproliferative disease: Secondary | ICD-10-CM | POA: Diagnosis not present

## 2021-05-26 DIAGNOSIS — D649 Anemia, unspecified: Secondary | ICD-10-CM

## 2021-05-26 DIAGNOSIS — M5033 Other cervical disc degeneration, cervicothoracic region: Secondary | ICD-10-CM | POA: Diagnosis not present

## 2021-05-26 DIAGNOSIS — D7581 Myelofibrosis: Secondary | ICD-10-CM | POA: Diagnosis not present

## 2021-05-26 LAB — HEMOGLOBIN AND HEMATOCRIT, BLOOD
HCT: 32.2 % — ABNORMAL LOW (ref 39.0–52.0)
Hemoglobin: 10 g/dL — ABNORMAL LOW (ref 13.0–17.0)

## 2021-06-02 ENCOUNTER — Other Ambulatory Visit: Payer: Self-pay | Admitting: *Deleted

## 2021-06-02 ENCOUNTER — Inpatient Hospital Stay: Payer: Medicare Other

## 2021-06-02 DIAGNOSIS — D649 Anemia, unspecified: Secondary | ICD-10-CM

## 2021-06-04 ENCOUNTER — Ambulatory Visit: Payer: Medicare Other | Admitting: Family Medicine

## 2021-06-09 ENCOUNTER — Other Ambulatory Visit: Payer: Self-pay

## 2021-06-09 ENCOUNTER — Inpatient Hospital Stay: Payer: Medicare Other | Attending: Oncology

## 2021-06-09 VITALS — BP 180/53 | HR 67

## 2021-06-09 DIAGNOSIS — D7581 Myelofibrosis: Secondary | ICD-10-CM | POA: Diagnosis not present

## 2021-06-09 DIAGNOSIS — M9903 Segmental and somatic dysfunction of lumbar region: Secondary | ICD-10-CM | POA: Diagnosis not present

## 2021-06-09 DIAGNOSIS — D649 Anemia, unspecified: Secondary | ICD-10-CM

## 2021-06-09 DIAGNOSIS — M5412 Radiculopathy, cervical region: Secondary | ICD-10-CM | POA: Diagnosis not present

## 2021-06-09 DIAGNOSIS — M5033 Other cervical disc degeneration, cervicothoracic region: Secondary | ICD-10-CM | POA: Diagnosis not present

## 2021-06-09 DIAGNOSIS — M62830 Muscle spasm of back: Secondary | ICD-10-CM | POA: Diagnosis not present

## 2021-06-09 LAB — HEMOGLOBIN AND HEMATOCRIT, BLOOD
HCT: 29.7 % — ABNORMAL LOW (ref 39.0–52.0)
Hemoglobin: 9.4 g/dL — ABNORMAL LOW (ref 13.0–17.0)

## 2021-06-09 MED ORDER — EPOETIN ALFA-EPBX 40000 UNIT/ML IJ SOLN
40000.0000 [IU] | INTRAMUSCULAR | Status: DC
  Administered 2021-06-09: 40000 [IU] via SUBCUTANEOUS
  Filled 2021-06-09: qty 1

## 2021-06-10 ENCOUNTER — Other Ambulatory Visit: Payer: Self-pay | Admitting: *Deleted

## 2021-06-10 DIAGNOSIS — D649 Anemia, unspecified: Secondary | ICD-10-CM

## 2021-06-16 ENCOUNTER — Inpatient Hospital Stay: Payer: Medicare Other

## 2021-06-16 ENCOUNTER — Inpatient Hospital Stay: Payer: Medicare Other | Attending: Oncology

## 2021-06-16 ENCOUNTER — Other Ambulatory Visit: Payer: Self-pay

## 2021-06-16 VITALS — BP 149/76 | HR 71

## 2021-06-16 DIAGNOSIS — D7581 Myelofibrosis: Secondary | ICD-10-CM

## 2021-06-16 DIAGNOSIS — D649 Anemia, unspecified: Secondary | ICD-10-CM | POA: Diagnosis not present

## 2021-06-16 LAB — HEMOGLOBIN AND HEMATOCRIT, BLOOD
HCT: 28.2 % — ABNORMAL LOW (ref 39.0–52.0)
Hemoglobin: 9 g/dL — ABNORMAL LOW (ref 13.0–17.0)

## 2021-06-16 MED ORDER — EPOETIN ALFA-EPBX 40000 UNIT/ML IJ SOLN
40000.0000 [IU] | INTRAMUSCULAR | Status: DC
  Administered 2021-06-16: 40000 [IU] via SUBCUTANEOUS
  Filled 2021-06-16: qty 1

## 2021-06-17 ENCOUNTER — Other Ambulatory Visit: Payer: Self-pay | Admitting: Family Medicine

## 2021-06-17 ENCOUNTER — Other Ambulatory Visit: Payer: Self-pay | Admitting: *Deleted

## 2021-06-17 DIAGNOSIS — D631 Anemia in chronic kidney disease: Secondary | ICD-10-CM

## 2021-06-17 DIAGNOSIS — D7581 Myelofibrosis: Secondary | ICD-10-CM

## 2021-06-17 DIAGNOSIS — D649 Anemia, unspecified: Secondary | ICD-10-CM

## 2021-06-17 NOTE — Telephone Encounter (Signed)
Requested Prescriptions  Pending Prescriptions Disp Refills   DULoxetine (CYMBALTA) 20 MG capsule [Pharmacy Med Name: DULOXETINE HCL 20 MG CAP] 90 capsule 0    Sig: TAKE 1 CAPSULE BY MOUTH EVERY DAY     Psychiatry: Antidepressants - SNRI Failed - 06/17/2021  8:51 AM      Failed - Last BP in normal range    BP Readings from Last 1 Encounters:  06/16/21 (!) 149/76         Passed - Valid encounter within last 6 months    Recent Outpatient Visits          1 month ago Splenomegaly   [employer], Megan P, DO   1 month ago Chest pain, unspecified type   [place], NP   2 months ago Herpes zoster without complication   [place], Lauren A, NP   7 months ago Mixed hyperlipidemia   [facility], Avanti, MD   8 months ago ETD (Eustachian tube dysfunction), left   [employer], Megan P, DO      Future Appointments            In 3 weeks  [employer], [place]   In 5 months Gollan, Kathlene November, MD [place], [place]

## 2021-06-17 NOTE — Telephone Encounter (Signed)
Requested Prescriptions  Pending Prescriptions Disp Refills   gabapentin (NEURONTIN) 300 MG capsule [Pharmacy Med Name: GABAPENTIN 300 MG CAP] 270 capsule 0    Sig: TAKE 1 CAPSULE BY MOUTH THREE TIMES DAILY     Neurology: Anticonvulsants - gabapentin Passed - 06/17/2021  8:52 AM      Passed - Valid encounter within last 12 months    Recent Outpatient Visits          1 month ago Splenomegaly   [place], Megan P, DO   1 month ago Chest pain, unspecified type   [place], NP   2 months ago Herpes zoster without complication   [place], Lauren A, NP   7 months ago Mixed hyperlipidemia   [facility] V[facility], Avanti, MD   8 months ago ETD (Eustachian tube dysfunction), left   [employer], Megan P, DO      Future Appointments            In 3 weeks  [employer], [place]   In 5 months Gollan, Kathlene November, MD [facility], [facility]

## 2021-06-23 ENCOUNTER — Other Ambulatory Visit: Payer: Self-pay | Admitting: Oncology

## 2021-06-23 ENCOUNTER — Inpatient Hospital Stay: Payer: Medicare Other | Attending: Oncology

## 2021-06-23 ENCOUNTER — Other Ambulatory Visit: Payer: Self-pay

## 2021-06-23 ENCOUNTER — Inpatient Hospital Stay: Payer: Medicare Other

## 2021-06-23 VITALS — BP 146/79 | HR 73

## 2021-06-23 DIAGNOSIS — M9901 Segmental and somatic dysfunction of cervical region: Secondary | ICD-10-CM | POA: Diagnosis not present

## 2021-06-23 DIAGNOSIS — D631 Anemia in chronic kidney disease: Secondary | ICD-10-CM

## 2021-06-23 DIAGNOSIS — D471 Chronic myeloproliferative disease: Secondary | ICD-10-CM | POA: Diagnosis not present

## 2021-06-23 DIAGNOSIS — M5033 Other cervical disc degeneration, cervicothoracic region: Secondary | ICD-10-CM | POA: Diagnosis not present

## 2021-06-23 DIAGNOSIS — D7581 Myelofibrosis: Secondary | ICD-10-CM

## 2021-06-23 DIAGNOSIS — M5412 Radiculopathy, cervical region: Secondary | ICD-10-CM | POA: Diagnosis not present

## 2021-06-23 DIAGNOSIS — D649 Anemia, unspecified: Secondary | ICD-10-CM

## 2021-06-23 DIAGNOSIS — M9903 Segmental and somatic dysfunction of lumbar region: Secondary | ICD-10-CM | POA: Diagnosis not present

## 2021-06-23 LAB — HEMOGLOBIN AND HEMATOCRIT, BLOOD
HCT: 30.2 % — ABNORMAL LOW (ref 39.0–52.0)
Hemoglobin: 9.5 g/dL — ABNORMAL LOW (ref 13.0–17.0)

## 2021-06-23 MED ORDER — EPOETIN ALFA-EPBX 40000 UNIT/ML IJ SOLN
40000.0000 [IU] | INTRAMUSCULAR | Status: DC
  Administered 2021-06-23: 40000 [IU] via SUBCUTANEOUS
  Filled 2021-06-23: qty 1

## 2021-06-30 ENCOUNTER — Inpatient Hospital Stay: Payer: Medicare Other | Attending: Oncology

## 2021-06-30 ENCOUNTER — Inpatient Hospital Stay: Payer: Medicare Other

## 2021-06-30 ENCOUNTER — Telehealth: Payer: Self-pay | Admitting: Oncology

## 2021-06-30 NOTE — Telephone Encounter (Signed)
Patient missed his Lab/Retacrit inj today. Called and left VM for him to return our call in order to get scheduled.

## 2021-07-07 ENCOUNTER — Inpatient Hospital Stay: Payer: Medicare Other | Attending: Oncology

## 2021-07-07 ENCOUNTER — Other Ambulatory Visit: Payer: Self-pay

## 2021-07-07 VITALS — BP 157/72 | HR 65

## 2021-07-07 DIAGNOSIS — D7581 Myelofibrosis: Secondary | ICD-10-CM | POA: Diagnosis not present

## 2021-07-07 DIAGNOSIS — N183 Chronic kidney disease, stage 3 unspecified: Secondary | ICD-10-CM | POA: Insufficient documentation

## 2021-07-07 DIAGNOSIS — D631 Anemia in chronic kidney disease: Secondary | ICD-10-CM | POA: Insufficient documentation

## 2021-07-07 DIAGNOSIS — D473 Essential (hemorrhagic) thrombocythemia: Secondary | ICD-10-CM | POA: Diagnosis not present

## 2021-07-07 DIAGNOSIS — M9903 Segmental and somatic dysfunction of lumbar region: Secondary | ICD-10-CM | POA: Diagnosis not present

## 2021-07-07 DIAGNOSIS — M5412 Radiculopathy, cervical region: Secondary | ICD-10-CM | POA: Diagnosis not present

## 2021-07-07 DIAGNOSIS — M5033 Other cervical disc degeneration, cervicothoracic region: Secondary | ICD-10-CM | POA: Diagnosis not present

## 2021-07-07 DIAGNOSIS — M9901 Segmental and somatic dysfunction of cervical region: Secondary | ICD-10-CM | POA: Diagnosis not present

## 2021-07-07 DIAGNOSIS — D649 Anemia, unspecified: Secondary | ICD-10-CM

## 2021-07-07 LAB — HEMOGLOBIN AND HEMATOCRIT, BLOOD
HCT: 30.5 % — ABNORMAL LOW (ref 39.0–52.0)
Hemoglobin: 9.5 g/dL — ABNORMAL LOW (ref 13.0–17.0)

## 2021-07-07 MED ORDER — EPOETIN ALFA-EPBX 10000 UNIT/ML IJ SOLN
10000.0000 [IU] | Freq: Once | INTRAMUSCULAR | Status: AC
  Administered 2021-07-07: 10000 [IU] via SUBCUTANEOUS
  Filled 2021-07-07: qty 1

## 2021-07-07 MED ORDER — EPOETIN ALFA-EPBX 40000 UNIT/ML IJ SOLN
40000.0000 [IU] | Freq: Once | INTRAMUSCULAR | Status: AC
  Administered 2021-07-07: 40000 [IU] via SUBCUTANEOUS
  Filled 2021-07-07: qty 1

## 2021-07-07 MED ORDER — EPOETIN ALFA-EPBX 20000 UNIT/ML IJ SOLN: 50000.0000 [IU] | INTRAMUSCULAR | Status: DC

## 2021-07-12 ENCOUNTER — Ambulatory Visit (INDEPENDENT_AMBULATORY_CARE_PROVIDER_SITE_OTHER): Payer: Medicare Other | Admitting: *Deleted

## 2021-07-12 DIAGNOSIS — Z0000 Encounter for general adult medical examination without abnormal findings: Secondary | ICD-10-CM | POA: Diagnosis not present

## 2021-07-12 NOTE — Patient Instructions (Signed)
[name] , Thank you for taking time to come for your Medicare Wellness Visit. I appreciate your ongoing commitment to your health goals. Please review the following plan we discussed and let me know if I can assist you in the future.   Screening recommendations/referrals: Colonoscopy: no longer indicated Recommended yearly ophthalmology/optometry visit for glaucoma screening and checkup Recommended yearly dental visit for hygiene and checkup  Vaccinations: Influenza vaccine: up to date Pneumococcal vaccine: up to date Tdap vaccine: up to date Shingles vaccine: Education provided    Advanced directives: Education provided  Conditions/risks identified:     Preventive Care 81 Years and Older, Male Preventive care refers to lifestyle choices and visits with your health care provider that can promote health and wellness. What does preventive care include? A yearly physical exam. This is also called an annual well check. Dental exams once or twice a year. Routine eye exams. Ask your health care provider how often you should have your eyes checked. Personal lifestyle choices, including: Daily care of your teeth and gums. Regular physical activity. Eating a healthy diet. Avoiding tobacco and drug use. Limiting alcohol use. Practicing safe sex. Taking low doses of aspirin every day. Taking vitamin and mineral supplements as recommended by your health care provider. What happens during an annual well check? The services and screenings done by your health care provider during your annual well check will depend on your age, overall health, lifestyle risk factors, and family history of disease. Counseling  Your health care provider may ask you questions about your: Alcohol use. Tobacco use. Drug use. Emotional well-being. Home and relationship well-being. Sexual activity. Eating habits. History of falls. Memory and ability to understand (cognition). Work and work  Statistician. Screening  You may have the following tests or measurements: Height, weight, and BMI. Blood pressure. Lipid and cholesterol levels. These may be checked every 5 years, or more frequently if you are over 30 years old. Skin check. Lung cancer screening. You may have this screening every year starting at age 81 if you have a 30-pack-year history of smoking and currently smoke or have quit within the past 15 years. Fecal occult blood test (FOBT) of the stool. You may have this test every year starting at age 81. Flexible sigmoidoscopy or colonoscopy. You may have a sigmoidoscopy every 5 years or a colonoscopy every 10 years starting at age 81. Prostate cancer screening. Recommendations will vary depending on your family history and other risks. Hepatitis C blood test. Hepatitis B blood test. Sexually transmitted disease (STD) testing. Diabetes screening. This is done by checking your blood sugar (glucose) after you have not eaten for a while (fasting). You may have this done every 1-3 years. Abdominal aortic aneurysm (AAA) screening. You may need this if you are a current or former smoker. Osteoporosis. You may be screened starting at age 81 if you are at high risk. Talk with your health care provider about your test results, treatment options, and if necessary, the need for more tests. Vaccines  Your health care provider may recommend certain vaccines, such as: Influenza vaccine. This is recommended every year. Tetanus, diphtheria, and acellular pertussis (Tdap, Td) vaccine. You may need a Td booster every 10 years. Zoster vaccine. You may need this after age 81. Pneumococcal 13-valent conjugate (PCV13) vaccine. One dose is recommended after age 81. Pneumococcal polysaccharide (PPSV23) vaccine. One dose is recommended after age 81. Talk to your health care provider about which screenings and vaccines you need and how often  you need them. This information is not intended to replace  advice given to you by your health care provider. Make sure you discuss any questions you have with your health care provider. Document Released: 06/19/2015 Document Revised: 02/10/2016 Document Reviewed: 03/24/2015 Elsevier Interactive Patient Education  2017 [place] Prevention in the Home Falls can cause injuries. They can happen to people of all ages. There are many things you can do to make your home safe and to help prevent falls. What can I do on the outside of my home? Regularly fix the edges of walkways and driveways and fix any cracks. Remove anything that might make you trip as you walk through a door, such as a raised step or threshold. Trim any bushes or trees on the path to your home. Use bright outdoor lighting. Clear any walking paths of anything that might make someone trip, such as rocks or tools. Regularly check to see if handrails are loose or broken. Make sure that both sides of any steps have handrails. Any raised decks and porches should have guardrails on the edges. Have any leaves, snow, or ice cleared regularly. Use sand or salt on walking paths during winter. Clean up any spills in your garage right away. This includes oil or grease spills. What can I do in the bathroom? Use night lights. Install grab bars by the toilet and in the tub and shower. Do not use towel bars as grab bars. Use non-skid mats or decals in the tub or shower. If you need to sit down in the shower, use a plastic, non-slip stool. Keep the floor dry. Clean up any water that spills on the floor as soon as it happens. Remove soap buildup in the tub or shower regularly. Attach bath mats securely with double-sided non-slip rug tape. Do not have throw rugs and other things on the floor that can make you trip. What can I do in the bedroom? Use night lights. Make sure that you have a light by your bed that is easy to reach. Do not use any sheets or blankets that are too big for your bed.  They should not hang down onto the floor. Have a firm chair that has side arms. You can use this for support while you get dressed. Do not have throw rugs and other things on the floor that can make you trip. What can I do in the kitchen? Clean up any spills right away. Avoid walking on wet floors. Keep items that you use a lot in easy-to-reach places. If you need to reach something above you, use a strong step stool that has a grab bar. Keep electrical cords out of the way. Do not use floor polish or wax that makes floors slippery. If you must use wax, use non-skid floor wax. Do not have throw rugs and other things on the floor that can make you trip. What can I do with my stairs? Do not leave any items on the stairs. Make sure that there are handrails on both sides of the stairs and use them. Fix handrails that are broken or loose. Make sure that handrails are as long as the stairways. Check any carpeting to make sure that it is firmly attached to the stairs. Fix any carpet that is loose or worn. Avoid having throw rugs at the top or bottom of the stairs. If you do have throw rugs, attach them to the floor with carpet tape. Make sure that you have a light  switch at the top of the stairs and the bottom of the stairs. If you do not have them, ask someone to add them for you. What else can I do to help prevent falls? Wear shoes that: Do not have high heels. Have rubber bottoms. Are comfortable and fit you well. Are closed at the toe. Do not wear sandals. If you use a stepladder: Make sure that it is fully opened. Do not climb a closed stepladder. Make sure that both sides of the stepladder are locked into place. Ask someone to hold it for you, if possible. Clearly mark and make sure that you can see: Any grab bars or handrails. First and last steps. Where the edge of each step is. Use tools that help you move around (mobility aids) if they are needed. These  include: Canes. Walkers. Scooters. Crutches. Turn on the lights when you go into a dark area. Replace any light bulbs as soon as they burn out. Set up your furniture so you have a clear path. Avoid moving your furniture around. If any of your floors are uneven, fix them. If there are any pets around you, be aware of where they are. Review your medicines with your doctor. Some medicines can make you feel dizzy. This can increase your chance of falling. Ask your doctor what other things that you can do to help prevent falls. This information is not intended to replace advice given to you by your health care provider. Make sure you discuss any questions you have with your health care provider. Document Released: 03/19/2009 Document Revised: 10/29/2015 Document Reviewed: 06/27/2014 Elsevier Interactive Patient Education  2017 [employer].

## 2021-07-12 NOTE — Progress Notes (Signed)
Subjective:   [name] is a 81 y.o. male who presents for Medicare Annual/Subsequent preventive examination.  I connected with  [name] on 07/12/21 by a telephone enabled telemedicine application and verified that I am speaking with the correct person using two identifiers.   I discussed the limitations of evaluation and management by telemedicine. The patient expressed understanding and agreed to proceed.  Patient location: home  Provider location:  Tele-Health  not in office    Review of Systems     Cardiac Risk Factors include: advanced age (>1men, >80 women);male gender     Objective:    Today's Vitals   07/12/21 0905  PainSc: 3    There is no height or weight on file to calculate BMI.  Advanced Directives 07/12/2021 01/20/2021 10/20/2020 07/22/2020 07/10/2020 01/30/2019 01/14/2019  Does Patient Have a Medical Advance Directive? No No No No No No No  Would patient like information on creating a medical advance directive? No - Patient declined No - Patient declined No - Patient declined No - Patient declined - - No - Patient declined  Pre-existing out of facility DNR order (yellow form or pink MOST form) - - - - - - -    Current Medications (verified) Outpatient Encounter Medications as of 07/12/2021  Medication Sig   amLODipine (NORVASC) 10 MG tablet Take 1 tablet (10 mg total) by mouth daily. In the evening   azelastine (ASTELIN) 0.1 % nasal spray Place 1 spray into both nostrils 2 (two) times daily. Use in each nostril as directed   calcium carbonate (OSCAL) 1500 (600 Ca) MG TABS tablet Take by mouth.   Cholecalciferol (D3-1000 PO) Take by mouth daily.   clopidogrel (PLAVIX) 75 MG tablet TAKE ONE TABLET BY MOUTH EVERY DAY   DULoxetine (CYMBALTA) 20 MG capsule TAKE 1 CAPSULE BY MOUTH EVERY DAY   gabapentin (NEURONTIN) 300 MG capsule TAKE 1 CAPSULE BY MOUTH THREE TIMES DAILY   Ginsengs-Saw Palmetto (MULTI GINSENG & SAW PALMETTO) 500 MG CAPS Take 500 mg by mouth  daily.   losartan (COZAAR) 100 MG tablet Take 1 tablet (100 mg total) by mouth in the morning.   meclizine (ANTIVERT) 25 MG tablet TAKE 1 TABLET BY MOUTH 3 TIMES DAILY AS NEEDED FOR DIZZINESS   Multiple Vitamin (MULITIVITAMIN WITH MINERALS) TABS Take 1 tablet by mouth daily.   Naproxen Sodium 220 MG CAPS Take by mouth as needed.   omeprazole (PRILOSEC) 40 MG capsule TAKE 1 CAPSULE BY MOUTH ONCE DAILY   simvastatin (ZOCOR) 40 MG tablet TAKE ONE TABLET BY MOUTH DAILY   tadalafil (CIALIS) 20 MG tablet Take 0.5-1 tablets (10-20 mg total) by mouth every other day as needed for erectile dysfunction.   tamsulosin (FLOMAX) 0.4 MG CAPS capsule TAKE 1 CAPSULE BY MOUTH DAILY   zinc gluconate 50 MG tablet Take by mouth.   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  Past Surgical History:  Procedure Laterality Date   ASPIRATION / INJECTION RENAL CYST  Feb 2017   BACK SURGERY     approx 20- 25 years ago   GAS INSERTION  08/11/2011   Procedure: INSERTION OF GAS;  Surgeon: Hayden Pedro, MD;  Location: [place];  Service: Ophthalmology;  Laterality: Right;  [id number]   SCLERAL BUCKLE  08/11/2011   Procedure: SCLERAL BUCKLE;  Surgeon: Hayden Pedro, MD;  Location: [place];  Service: Ophthalmology;  Laterality: Right;   VARICOSE VEIN SURGERY     Family History  Problem Relation Age of Onset   Heart disease Father    Diabetes Son    Kidney disease Neg Hx    Prostate cancer  Neg Hx    Social History   Socioeconomic History   Marital status: Married    Spouse name: Parke Simmers    Number of children: 2   Years of education: 12+   Highest education level: Some college, no degree  Occupational History   Occupation: Education officer, 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   Caffeine Use- 2 cups daily   Patient has 2 children.    Patient has some college.    Patient is right handed.          Works full time   Scientist, physiological Strain: Low Risk    Difficulty of Paying Living Expenses: Not hard at all  Food Insecurity: No Food Insecurity   Worried About Charity fundraiser in the Last Year: Never true   Arboriculturist in the Last Year: Never true  Transportation Needs: No Transportation Needs   Lack of Transportation (Medical): No   Lack of Transportation (Non-Medical): No  Physical Activity: Inactive   Days of Exercise per Week: 0 days   Minutes of Exercise per Session: 0 min  Stress: No Stress Concern Present   Feeling of Stress : Not at all  Social Connections: Socially Integrated   Frequency of Communication with Friends and Family: Three times a week   Frequency of Social Gatherings with Friends and Family: More than three times a week   Attends Religious Services: More than 4 times per year   Active Member of Clubs or Organizations: Yes   Attends Music therapist: More than 4 times per year   Marital Status: Married     Tobacco Counseling Counseling given: Not Answered   Clinical Intake:  Pre-visit preparation completed: Yes  Pain : 0-10 Pain Score: 3  Pain Location: Back Pain Descriptors / Indicators: Aching, Constant, Burning Pain Frequency: Rarely Pain Relieving Factors: nothing  Pain Relieving Factors: nothing  Nutritional Risks: None Diabetes: No  How often do you need to have someone help you when you read instructions, pamphlets, or other written materials from your doctor or pharmacy?:  1 - Never  Diabetic?  no  Interpreter Needed?: No  Information entered by :: Leroy Kennedy LPN   Activities of Daily Living In your present state of health, do you have any difficulty performing the following activities: 07/12/2021  Hearing? Y  Vision? N  Difficulty concentrating or making decisions? N  Walking or climbing stairs? N  Dressing or bathing? N  Doing errands, shopping? N  Preparing Food and eating ? N  Using the Toilet? N  In the past six months, have you accidently leaked urine? N  Do you have problems with loss of bowel control? N  Managing your Medications? N  Managing your Finances? N  Housekeeping or managing your Housekeeping? N  Some recent data might be hidden    Patient Care Team: [name], DO as PCP - General (Family Medicine) Pieter Partridge, MD as Referring Physician (Hematology) Hayden Pedro, MD as Consulting Physician (Ophthalmology) Garvin Fila, MD as Consulting Physician (Neurology) Festus Aloe, MD as Consulting Physician (Urology) Minna Merritts, MD as Consulting Physician (Cardiology) Abbie Sons, MD (Urology) Sindy Guadeloupe, MD as Consulting Physician (Hematology and Oncology)  Indicate any recent Medical Services you may have received from other than [facility] providers in the past year (date may be approximate).     Assessment:   This is a routine wellness examination for [place].  Hearing/Vision screen Hearing  Screening - Comments:: Bilateral hearing aids Vision Screening - Comments:: Not up to date [facility]  Dietary issues and exercise activities discussed: Current Exercise Habits: The patient does not participate in regular exercise at present, Exercise limited by: None identified   Goals Addressed             This Visit's Progress    DIET - INCREASE WATER INTAKE   On track    Recommend drinking at least 6-8 glasses of water a day      Patient Stated       Continue current lifestyle       Depression Screen PHQ 2/9 Scores 07/12/2021 05/14/2021 07/10/2020 10/18/2019 01/30/2019 01/24/2018 01/18/2017  PHQ - 2 Score 0 0 0 0 0 0 0  PHQ- 9 Score 0 0 - 1 - - -    Fall Risk Fall Risk  07/12/2021 10/27/2020 07/10/2020 05/25/2020 01/30/2019  Falls in the past year? 0 0 0 0 0  Number falls in past yr: 0 0 0 0 -  Injury with Fall? 0 0 - 0 -  Risk for fall due to : No Fall Risks - Medication side effect No Fall Risks -  Follow up - - Falls evaluation completed;Education provided;Falls prevention discussed Falls evaluation completed -    FALL RISK PREVENTION PERTAINING TO THE HOME:  Any stairs in or around the home? Yes  If so, are there any without handrails? No  Home free of loose throw rugs in walkways, pet beds, electrical cords, etc? Yes  Adequate lighting in your home to reduce risk of falls? Yes   ASSISTIVE DEVICES UTILIZED TO PREVENT FALLS:  Life alert? No  Use of a cane, walker or w/c? No  Grab bars in the bathroom? Yes  Shower chair or bench in shower? Yes  Elevated toilet seat or a handicapped toilet? Yes   TIMED UP AND GO:  Was the test performed? No .    Cognitive Function:  Normal cognitive status assessed by direct observation by this Nurse Health Advisor. No abnormalities found.  6CIT Screen 07/10/2020 01/24/2018 01/18/2017  What Year? 0 points 0 points 0 points  What month? 0 points 0 points 0 points  What time? 0 points 0 points 0 points  Count back  from 20 0 points 0 points 0 points  Months in reverse 2 points 0 points 0 points  Repeat phrase 0 points 0 points 2 points  Total Score 2 0 2    Immunizations Immunization History  Administered Date(s) Administered   Fluad Quad(high Dose 65+) 03/28/2019, 05/05/2020, 05/11/2021   Influenza, High Dose Seasonal PF 02/09/2017, 03/13/2018   Influenza,inj,Quad PF,6+ Mos 03/03/2015   Influenza-Unspecified 03/07/2016   PFIZER(Purple Top)SARS-COV-2 Vaccination 08/01/2019, 08/22/2019   Pneumococcal Conjugate-13 07/08/2015   Pneumococcal Polysaccharide-23 08/12/2011   Td 10/11/2016   Zoster, Live 06/07/2011    TDAP status: Up to date  Flu Vaccine status: Up to date  Pneumococcal vaccine status: Up to date  Covid-19 vaccine status: Information provided on how to obtain vaccines.   Qualifies for Shingles Vaccine? Yes   Zostavax completed Yes   Shingrix Completed?: No.    Education has been provided regarding the importance of this vaccine. Patient has been advised to call insurance company to determine out of pocket expense if they have not yet received this vaccine. Advised may also receive vaccine at local pharmacy or Health Dept. Verbalized acceptance and understanding.  Screening Tests Health Maintenance  Topic Date Due   Zoster Vaccines- Shingrix (1 of 2) Never done   COVID-19 Vaccine (3 - Pfizer risk series) 09/19/2019   TETANUS/TDAP  10/12/2026   Pneumonia Vaccine 69+ Years old  Completed   INFLUENZA VACCINE  Completed   HPV VACCINES  Aged Out    Health Maintenance  Health Maintenance Due  Topic Date Due   Zoster Vaccines- Shingrix (1 of 2) Never done   COVID-19 Vaccine (3 - Pfizer risk series) 09/19/2019    Colorectal cancer screening: No longer required.   Lung Cancer Screening: (Low Dose CT Chest recommended if Age 52-80 years, 30 pack-year currently smoking OR have quit w/in 15years.) does not qualify.   Lung Cancer Screening Referral:   Additional  Screening:  Hepatitis C Screening: does not qualify; Completed 2021  Vision Screening: Recommended annual ophthalmology exams for early detection of glaucoma and other disorders of the eye. Is the patient up to date with their annual eye exam?  No  Who is the provider or what is the name of the office in which the patient attends annual eye exams? Vision If pt is not established with a provider, would they like to be referred to a provider to establish care? No .   Dental Screening: Recommended annual dental exams for proper oral hygiene  Community Resource Referral / Chronic Care Management: CRR required this visit?  No   CCM required this visit?  No      Plan:     I have personally reviewed and noted the following in the patients chart:   Medical and social history Use of alcohol, tobacco or illicit drugs  Current medications and supplements including opioid prescriptions. Patient is not currently taking opioid prescriptions. Functional ability and status Nutritional status Physical activity Advanced directives List of other physicians Hospitalizations, surgeries, and ER visits in previous 12 months Vitals Screenings to include cognitive, depression, and falls Referrals and appointments  In addition, I have reviewed and discussed with patient certain preventive protocols, quality metrics, and best practice recommendations. A written personalized care plan for preventive services as well as  general preventive health recommendations were provided to patient.     Leroy Kennedy, LPN   2/0/7218   Nurse Notes: Patient report some back pain and frequent urination patient was advised to call for an appointment .                 Pre-visit preparation completed: Yes  Pain : 0-10 Pain Score: 3  Pain Location: Back Pain Descriptors / Indicators: Aching, Constant, Burning Pain Frequency: Rarely Pain Relieving Factors: nothing  Pain Relieving Factors:  nothing  Nutritional Risks: None Diabetes: No  How often do you need to have someone help you when you read instructions, pamphlets, or other written materials from your doctor or pharmacy?: 1 - Never   Interpreter Needed?: No  Information entered by :: Leroy Kennedy LPN

## 2021-07-13 ENCOUNTER — Ambulatory Visit (INDEPENDENT_AMBULATORY_CARE_PROVIDER_SITE_OTHER): Payer: Medicare Other | Admitting: Internal Medicine

## 2021-07-13 ENCOUNTER — Encounter: Payer: Self-pay | Admitting: Internal Medicine

## 2021-07-13 ENCOUNTER — Other Ambulatory Visit: Payer: Self-pay

## 2021-07-13 VITALS — BP 120/68 | HR 72 | Temp 98.8°F | Ht 70.0 in | Wt 190.2 lb

## 2021-07-13 DIAGNOSIS — R350 Frequency of micturition: Secondary | ICD-10-CM

## 2021-07-13 DIAGNOSIS — M545 Low back pain, unspecified: Secondary | ICD-10-CM | POA: Diagnosis not present

## 2021-07-13 LAB — MICROSCOPIC EXAMINATION: Epithelial Cells (non renal): NONE SEEN /hpf (ref 0–10)

## 2021-07-13 LAB — URINALYSIS, ROUTINE W REFLEX MICROSCOPIC
Bilirubin, UA: NEGATIVE
Ketones, UA: NEGATIVE
Leukocytes,UA: NEGATIVE
Nitrite, UA: NEGATIVE
Specific Gravity, UA: >1.03 — ABNORMAL HIGH (ref 1.005–1.030)
Urobilinogen, Ur: 0.2 mg/dL (ref 0.2–1.0)
pH, UA: 6 (ref 5.0–7.5)

## 2021-07-13 MED ORDER — SULFAMETHOXAZOLE-TRIMETHOPRIM 800-160 MG PO TABS: 1.0000 | ORAL_TABLET | Freq: Two times a day (BID) | ORAL | 0 refills | Status: AC

## 2021-07-13 NOTE — Progress Notes (Signed)
BP 120/68    Pulse 72    Temp 98.8 F (37.1 C) (Oral)    Ht 5\' 10"  (1.778 m)    Wt 190 lb 3.2 oz (86.3 kg)    SpO2 98%    BMI 27.29 kg/m    Subjective:    Patient ID: [name], male    DOB: 02/21/1941, 81 y.o.   MRN: [id number]  Chief Complaint  Patient presents with   Urinary Frequency    With low back pain.2 days ago    HPI: [name] is a 81 y.o. male  Urinary Frequency  This is a new problem. The current episode started in the past 7 days. The problem has been gradually worsening. The quality of the pain is described as shooting. The pain is at a severity of 6/10. There has been no fever. Associated symptoms include flank pain and frequency. Pertinent negatives include no chills, discharge, hematuria, hesitancy, nausea, sweats or urgency.   Chief Complaint  Patient presents with   Urinary Frequency    With low back pain.2 days ago    Relevant past medical, surgical, family and social history reviewed and updated as indicated. Interim medical history since our last visit reviewed. Allergies and medications reviewed and updated.  Review of Systems  Constitutional:  Negative for chills.  Gastrointestinal:  Negative for nausea.  Genitourinary:  Positive for flank pain and frequency. Negative for hematuria, hesitancy and urgency.   Per HPI unless specifically indicated above     Objective:    BP 120/68    Pulse 72    Temp 98.8 F (37.1 C) (Oral)    Ht 5\' 10"  (1.778 m)    Wt 190 lb 3.2 oz (86.3 kg)    SpO2 98%    BMI 27.29 kg/m   Wt Readings from Last 3 Encounters:  07/13/21 190 lb 3.2 oz (86.3 kg)  05/17/21 185 lb (83.9 kg)  05/14/21 188 lb (85.3 kg)    Physical Exam  Results for orders placed or performed in visit on 07/13/21  Microscopic Examination   Urine  Result Value Ref Range   WBC, UA 0-5 0 - 5 /hpf   RBC 3-10 (A) 0 - 2 /hpf   Epithelial Cells (non renal) None seen 0 - 10 /hpf   Casts Present (A) None seen /lpf   Cast Type Hyaline casts N/A    Mucus, UA Present (A) Not Estab.   Bacteria, UA Few (A) None seen/Few  Urinalysis, Routine w reflex microscopic  Result Value Ref Range   Specific Gravity, UA >1.030 (H) 1.005 - 1.030   pH, UA 6.0 5.0 - 7.5   Color, UA Yellow Yellow   Appearance Ur Clear Clear   Leukocytes,UA Negative Negative   Protein,UA 3+ (A) Negative/Trace   Glucose, UA Trace (A) Negative   Ketones, UA Negative Negative   RBC, UA 1+ (A) Negative   Bilirubin, UA Negative Negative   Urobilinogen, Ur 0.2 0.2 - 1.0 mg/dL   Nitrite, UA Negative Negative   Microscopic Examination See below:         Current Outpatient Medications:    amLODipine (NORVASC) 10 MG tablet, Take 1 tablet (10 mg total) by mouth daily. In the evening, Disp: 90 tablet, Rfl: 3   azelastine (ASTELIN) 0.1 % nasal spray, Place 1 spray into both nostrils 2 (two) times daily. Use in each nostril as directed, Disp: 30 mL, Rfl: 0   calcium carbonate (OSCAL) 1500 (600 Ca) MG  TABS tablet, Take by mouth., Disp: , Rfl:    Cholecalciferol (D3-1000 PO), Take by mouth daily., Disp: , Rfl:    clopidogrel (PLAVIX) 75 MG tablet, TAKE ONE TABLET BY MOUTH EVERY DAY, Disp: 90 tablet, Rfl: 1   DULoxetine (CYMBALTA) 20 MG capsule, TAKE 1 CAPSULE BY MOUTH EVERY DAY, Disp: 90 capsule, Rfl: 0   gabapentin (NEURONTIN) 300 MG capsule, TAKE 1 CAPSULE BY MOUTH THREE TIMES DAILY, Disp: 270 capsule, Rfl: 0   Ginsengs-Saw Palmetto (MULTI GINSENG & SAW PALMETTO) 500 MG CAPS, Take 500 mg by mouth daily., Disp: , Rfl:    losartan (COZAAR) 100 MG tablet, Take 1 tablet (100 mg total) by mouth in the morning., Disp: 90 tablet, Rfl: 3   meclizine (ANTIVERT) 25 MG tablet, TAKE 1 TABLET BY MOUTH 3 TIMES DAILY AS NEEDED FOR DIZZINESS, Disp: 90 tablet, Rfl: 0   Multiple Vitamin (MULITIVITAMIN WITH MINERALS) TABS, Take 1 tablet by mouth daily., Disp: , Rfl:    Naproxen Sodium 220 MG CAPS, Take by mouth as needed., Disp: , Rfl:    omeprazole (PRILOSEC) 40 MG capsule, TAKE 1 CAPSULE BY  MOUTH ONCE DAILY, Disp: 90 capsule, Rfl: 0   simvastatin (ZOCOR) 40 MG tablet, TAKE ONE TABLET BY MOUTH DAILY, Disp: 30 tablet, Rfl: 0   sulfamethoxazole-trimethoprim (BACTRIM DS) 800-160 MG tablet, Take 1 tablet by mouth 2 (two) times daily for 7 days., Disp: 14 tablet, Rfl: 0   tadalafil (CIALIS) 20 MG tablet, Take 0.5-1 tablets (10-20 mg total) by mouth every other day as needed for erectile dysfunction., Disp: 5 tablet, Rfl: 11   tamsulosin (FLOMAX) 0.4 MG CAPS capsule, TAKE 1 CAPSULE BY MOUTH DAILY, Disp: 90 capsule, Rfl: 0   zinc gluconate 50 MG tablet, Take by mouth., Disp: , Rfl:  No current facility-administered medications for this visit.  Facility-Administered Medications Ordered in Other Visits:    epoetin alfa-epbx (RETACRIT) injection 40,000 Units, 40,000 Units, Subcutaneous, Weekly, Sindy Guadeloupe, MD, 40,000 Units at 04/14/21 1406    Assessment & Plan:   UTI check UA.  pt is currently symptomatic for an Urinary tract infection(abd pain, burning etc), will cover with emperic abx, see med module for details.  encouraged to increase water/fluid intake.Signs and symptoms of emergency were discussed with the patient. The risks, benefits and side effects of treatment were discussed with the patient. The patient verbalized an understanding of plan, and was told to call the clinic/go to the ED if symptoms worsen at any point of time.  Problem List Items Addressed This Visit   None Visit Diagnoses     Low back pain, unspecified back pain laterality, unspecified chronicity, unspecified whether sciatica present    -  Primary   Relevant Orders   Urinalysis, Routine w reflex microscopic (Completed)   Urine Culture   Frequency of urination       Relevant Orders   Urinalysis, Routine w reflex microscopic (Completed)   Urine Culture        Orders Placed This Encounter  Procedures   Urine Culture   Microscopic Examination   Urinalysis, Routine w reflex microscopic     Meds  ordered this encounter  Medications   sulfamethoxazole-trimethoprim (BACTRIM DS) 800-160 MG tablet    Sig: Take 1 tablet by mouth 2 (two) times daily for 7 days.    Dispense:  14 tablet    Refill:  0     Follow up plan: No follow-ups on file.

## 2021-07-13 NOTE — Patient Instructions (Signed)
Urinary Tract Infection, Adult A urinary tract infection (UTI) is an infection of any part of the urinary tract. The urinary tract includes the kidneys, ureters, bladder, and urethra. These organs make, store, and get rid of urine in the body. An upper UTI affects the ureters and kidneys. A lower UTI affects the bladder and urethra. What are the causes? Most urinary tract infections are caused by bacteria in your genital area around your urethra, where urine leaves your body. These bacteria grow and cause inflammation of your urinary tract. What increases the risk? You are more likely to develop this condition if: You have a urinary catheter that stays in place. You are not able to control when you urinate or have a bowel movement (incontinence). You are male and you: Use a spermicide or diaphragm for birth control. Have low estrogen levels. Are pregnant. You have certain genes that increase your risk. You are sexually active. You take antibiotic medicines. You have a condition that causes your flow of urine to slow down, such as: An enlarged prostate, if you are male. Blockage in your urethra. A kidney stone. A nerve condition that affects your bladder control (neurogenic bladder). Not getting enough to drink, or not urinating often. You have certain medical conditions, such as: Diabetes. A weak disease-fighting system (immunesystem). Sickle cell disease. Gout. Spinal cord injury. What are the signs or symptoms? Symptoms of this condition include: Needing to urinate right away (urgency). Frequent urination. This may include small amounts of urine each time you urinate. Pain or burning with urination. Blood in the urine. Urine that smells bad or unusual. Trouble urinating. Cloudy urine. Vaginal discharge, if you are male. Pain in the abdomen or the lower back. You may also have: Vomiting or a decreased appetite. Confusion. Irritability or tiredness. A fever or  chills. Diarrhea. The first symptom in older adults may be confusion. In some cases, they may not have any symptoms until the infection has worsened. How is this diagnosed? This condition is diagnosed based on your medical history and a physical exam. You may also have other tests, including: Urine tests. Blood tests. Tests for STIs (sexually transmitted infections). If you have had more than one UTI, a cystoscopy or imaging studies may be done to determine the cause of the infections. How is this treated? Treatment for this condition includes: Antibiotic medicine. Over-the-counter medicines to treat discomfort. Drinking enough water to stay hydrated. If you have frequent infections or have other conditions such as a kidney stone, you may need to see a health care provider who specializes in the urinary tract (urologist). In rare cases, urinary tract infections can cause sepsis. Sepsis is a life-threatening condition that occurs when the body responds to an infection. Sepsis is treated in the hospital with IV antibiotics, fluids, and other medicines. Follow these instructions at home: Medicines Take over-the-counter and prescription medicines only as told by your health care provider. If you were prescribed an antibiotic medicine, take it as told by your health care provider. Do not stop using the antibiotic even if you start to feel better. General instructions Make sure you: Empty your bladder often and completely. Do not hold urine for long periods of time. Empty your bladder after sex. Wipe from front to back after urinating or having a bowel movement if you are male. Use each tissue only one time when you wipe. Drink enough fluid to keep your urine pale yellow. Keep all follow-up visits. This is important. Contact a health care provider   if: Your symptoms do not get better after 1-2 days. Your symptoms go away and then return. Get help right away if: You have severe pain in your  back or your lower abdomen. You have a fever or chills. You have nausea or vomiting. Summary A urinary tract infection (UTI) is an infection of any part of the urinary tract, which includes the kidneys, ureters, bladder, and urethra. Most urinary tract infections are caused by bacteria in your genital area. Treatment for this condition often includes antibiotic medicines. If you were prescribed an antibiotic medicine, take it as told by your health care provider. Do not stop using the antibiotic even if you start to feel better. Keep all follow-up visits. This is important. This information is not intended to replace advice given to you by your health care provider. Make sure you discuss any questions you have with your health care provider. Document Revised: 01/03/2020 Document Reviewed: 01/03/2020 Elsevier Patient Education  2022 Elsevier Inc.  

## 2021-07-14 ENCOUNTER — Inpatient Hospital Stay: Payer: Medicare Other

## 2021-07-14 ENCOUNTER — Encounter: Payer: Self-pay | Admitting: Oncology

## 2021-07-14 ENCOUNTER — Inpatient Hospital Stay (HOSPITAL_BASED_OUTPATIENT_CLINIC_OR_DEPARTMENT_OTHER): Payer: Medicare Other | Admitting: Oncology

## 2021-07-14 ENCOUNTER — Other Ambulatory Visit: Payer: Self-pay | Admitting: Family Medicine

## 2021-07-14 VITALS — BP 139/67 | HR 68 | Temp 96.7°F | Resp 16 | Ht 70.0 in | Wt 191.6 lb

## 2021-07-14 DIAGNOSIS — D649 Anemia, unspecified: Secondary | ICD-10-CM | POA: Diagnosis not present

## 2021-07-14 DIAGNOSIS — D7581 Myelofibrosis: Secondary | ICD-10-CM

## 2021-07-14 DIAGNOSIS — Z79899 Other long term (current) drug therapy: Secondary | ICD-10-CM | POA: Diagnosis not present

## 2021-07-14 DIAGNOSIS — N183 Chronic kidney disease, stage 3 unspecified: Secondary | ICD-10-CM

## 2021-07-14 DIAGNOSIS — D631 Anemia in chronic kidney disease: Secondary | ICD-10-CM | POA: Diagnosis not present

## 2021-07-14 DIAGNOSIS — D474 Osteomyelofibrosis: Secondary | ICD-10-CM

## 2021-07-14 DIAGNOSIS — D473 Essential (hemorrhagic) thrombocythemia: Secondary | ICD-10-CM | POA: Diagnosis not present

## 2021-07-14 LAB — CBC WITH DIFFERENTIAL/PLATELET
Abs Immature Granulocytes: 0.2 10*3/uL — ABNORMAL HIGH (ref 0.00–0.07)
Band Neutrophils: 0 %
Basophils Absolute: 0 10*3/uL (ref 0.0–0.1)
Basophils Relative: 0 %
Blasts: 0 %
Eosinophils Absolute: 0 10*3/uL (ref 0.0–0.5)
Eosinophils Relative: 0 %
HCT: 30.7 % — ABNORMAL LOW (ref 39.0–52.0)
Hemoglobin: 9.9 g/dL — ABNORMAL LOW (ref 13.0–17.0)
Lymphocytes Relative: 22 %
Lymphs Abs: 0.6 10*3/uL — ABNORMAL LOW (ref 0.7–4.0)
MCH: 29.4 pg (ref 26.0–34.0)
MCHC: 32.2 g/dL (ref 30.0–36.0)
MCV: 91.1 fL (ref 80.0–100.0)
Metamyelocytes Relative: 2 %
Monocytes Absolute: 0.3 10*3/uL (ref 0.1–1.0)
Monocytes Relative: 12 %
Myelocytes: 4 %
Neutro Abs: 1.7 10*3/uL (ref 1.7–7.7)
Neutrophils Relative %: 59 %
Other: 0 %
Platelets: 180 10*3/uL (ref 150–400)
Promyelocytes Relative: 1 %
RBC: 3.37 MIL/uL — ABNORMAL LOW (ref 4.22–5.81)
RDW: 17.8 % — ABNORMAL HIGH (ref 11.5–15.5)
Smear Review: NORMAL
WBC: 2.8 10*3/uL — ABNORMAL LOW (ref 4.0–10.5)
nRBC: 0 /100 WBC
nRBC: 10 % — ABNORMAL HIGH (ref 0.0–0.2)

## 2021-07-14 LAB — IRON AND TIBC
Iron: 34 ug/dL — ABNORMAL LOW (ref 45–182)
Saturation Ratios: 15 % — ABNORMAL LOW (ref 17.9–39.5)
TIBC: 235 ug/dL — ABNORMAL LOW (ref 250–450)
UIBC: 201 ug/dL

## 2021-07-14 LAB — FERRITIN: Ferritin: 133 ng/mL (ref 24–336)

## 2021-07-14 MED ORDER — EPOETIN ALFA-EPBX 10000 UNIT/ML IJ SOLN
10000.0000 [IU] | Freq: Once | INTRAMUSCULAR | Status: AC
  Administered 2021-07-14: 10000 [IU] via SUBCUTANEOUS

## 2021-07-14 MED ORDER — EPOETIN ALFA-EPBX 20000 UNIT/ML IJ SOLN: 50000.0000 [IU] | INTRAMUSCULAR | Status: DC

## 2021-07-14 MED ORDER — EPOETIN ALFA-EPBX 40000 UNIT/ML IJ SOLN
40000.0000 [IU] | Freq: Once | INTRAMUSCULAR | Status: AC
  Administered 2021-07-14: 40000 [IU] via SUBCUTANEOUS
  Filled 2021-07-14: qty 1

## 2021-07-14 NOTE — Telephone Encounter (Signed)
Requested Prescriptions  Pending Prescriptions Disp Refills   clopidogrel (PLAVIX) 75 MG tablet [Pharmacy Med Name: CLOPIDOGREL BISULFATE 75 MG TAB] 90 tablet 1    Sig: TAKE ONE TABLET BY MOUTH EVERY DAY     Hematology: Antiplatelets - clopidogrel Failed - 07/14/2021  8:39 AM      Failed - HCT in normal range and within 180 days    HCT  Date Value Ref Range Status  07/07/2021 30.5 (L) 39.0 - 52.0 % Final    Comment:    Performed at [facility], [address] [place], [place] [address]   Hematocrit  Date Value Ref Range Status  05/25/2020 28.0 (L) 37.5 - 51.0 % Final         Failed - HGB in normal range and within 180 days    Hemoglobin  Date Value Ref Range Status  07/07/2021 9.5 (L) 13.0 - 17.0 g/dL Final  05/25/2020 9.4 (L) 13.0 - 17.7 g/dL Final         Passed - PLT in normal range and within 180 days    Platelets  Date Value Ref Range Status  04/14/2021 237 150 - 400 K/uL Final  05/25/2020 223 150 - 450 x10E3/uL Final         Passed - Cr in normal range and within 360 days    Creatinine  Date Value Ref Range Status  06/18/2014 1.09 0.60 - 1.30 mg/dL Final   Creatinine, Ser  Date Value Ref Range Status  04/14/2021 1.23 0.61 - 1.24 mg/dL Final         Passed - Valid encounter within last 6 months    Recent Outpatient Visits          Yesterday Low back pain, unspecified back pain laterality, unspecified chronicity, unspecified whether sciatica present   [facility] Vigg, Avanti, MD   2 months ago Splenomegaly   [facility] [place], Megan P, DO   2 months ago Chest pain, unspecified type   [facility] McElwee, Lauren A, NP   3 months ago Herpes zoster without complication   [facility] McElwee, Lauren A, NP   8 months ago Mixed hyperlipidemia   [facility] Vigg, Avanti, MD      Future Appointments            In 1 week Johnson, Barb Merino, DO [place], [place]   In 4 months  Gollan, Kathlene November, MD [place], [facility]

## 2021-07-15 ENCOUNTER — Encounter: Payer: Self-pay | Admitting: Oncology

## 2021-07-15 LAB — URINE CULTURE

## 2021-07-15 NOTE — Progress Notes (Signed)
Hematology/Oncology Consult note [place]  [phone] [phone]  Patient Care Team: [name], DO as PCP - General (Family Medicine) Pieter Partridge, MD as Referring Physician (Hematology) Hayden Pedro, MD as Consulting Physician (Ophthalmology) Garvin Fila, MD as Consulting Physician (Neurology) Festus Aloe, MD as Consulting Physician (Urology) Minna Merritts, MD as Consulting Physician (Cardiology) Abbie Sons, MD (Urology) Sindy Guadeloupe, MD as Consulting Physician (Hematology and Oncology)   Name of the patient: [name]  [id number]  1941-01-26   Date of visit: 07/15/21  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  At that time he was found to have a mild splenomegaly of 15.8 cm.DIPPS score is 81 (age 80- 1, hemoglobin less than 10- 2) and score of 4 if 1% circulating blasts included from 07/17/2014.   Bone marrow on 06/11/2014 was most consistent with primary myelofibrosis.  Bone marrow biopsy showed 1% abnormal cells: CD45+, CD5+, CD10, CD11c+/-, CD19+, CD2-+, (dim), CD22+ (dim, CD23+, CD38-/+, FMC7-, HLA-DR+, sig lambda+(dim).  Blasts were not increased 1.2%; hypercellular for age: 48%; JAK2 V617F mutation was negative.  CALR mutation positive.  Flow cytometry included about 1% CLL/SLL phenotype cells (KG8+) of uncertain significance and some infiltrate into the marrow with increased atypical megakaryocytes.  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  MDS FISH panel was negative.   Patient also follows up with [facility] benign hematology Dr. Sabino Dick for his anemia.  Patient's hemoglobin was drifting down to the eights and was started on EPO in October 2022.    Interval  history-patient reports feeling well overall.  He continues to do a full-time job and denies any significant fatigue  ECOG PS- 1 Pain scale- 0   Review of systems- Review of Systems  Constitutional:  Negative for chills, fever, malaise/fatigue and weight loss.  HENT:  Negative for congestion, ear discharge and nosebleeds.   Eyes:  Negative for blurred vision.  Respiratory:  Negative for cough, hemoptysis, sputum production, shortness of breath and wheezing.   Cardiovascular:  Negative for chest pain, palpitations, orthopnea and claudication.  Gastrointestinal:  Negative for abdominal pain, blood in stool, constipation, diarrhea, heartburn, melena, nausea and vomiting.  Genitourinary:  Negative for dysuria, flank pain, frequency, hematuria and urgency.  Musculoskeletal:  Negative for back pain, joint pain and myalgias.  Skin:  Negative for rash.  Neurological:  Negative for dizziness, tingling, focal weakness, seizures, weakness and headaches.  Endo/Heme/Allergies:  Does not bruise/bleed easily.  Psychiatric/Behavioral:  Negative for depression and suicidal ideas. The patient does not have insomnia.       Allergies  Allergen Reactions   Meloxicam Nausea And Vomiting     Past Medical History:  Diagnosis Date   Arthritis    Benign hypertensive renal disease    GERD (gastroesophageal reflux disease)    Heartburn    History of retinal detachment    Hyperlipidemia    Hypertension    Melanoma ([place])    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 approximately 10-15 years ago   Myelofibrosis [facility])    Prostate hypertrophy    Stroke [facility]) June 2009   R brain subcortical infarct     Past Surgical History:  Procedure Laterality Date   ASPIRATION / INJECTION RENAL CYST  Feb 2017   BACK  SURGERY     approx 20- 25 years ago   GAS INSERTION  08/11/2011   Procedure: INSERTION OF GAS;  Surgeon: Hayden Pedro, MD;  Location: [place];  Service: Ophthalmology;  Laterality: Right;  [id number]    SCLERAL BUCKLE  08/11/2011   Procedure: SCLERAL BUCKLE;  Surgeon: Hayden Pedro, MD;  Location: [place];  Service: Ophthalmology;  Laterality: Right;   VARICOSE VEIN SURGERY      Social History   Socioeconomic History   Marital status: Married    Spouse name: Parke Simmers    Number of children: 2   Years of education: 12+   Highest education level: Some college, no degree  Occupational History   Occupation: Education officer, community: OTHER    Comment: community   Occupation: SELF EMPLOYED    Employer: SELF EMPLOYED  Tobacco Use   Smoking status: Former    Packs/day: 0.25    Types: Cigarettes    Quit date: 06/06/2010    Years since quitting: 11.1   Smokeless tobacco: Never  Vaping Use   Vaping Use: Never used  Substance and Sexual Activity   Alcohol use: No    Alcohol/week: 0.0 standard drinks   Drug use: No   Sexual activity: Not on file  Other Topics Concern   Not on file  Social History Narrative   Pt lives at home with his family.   Caffeine Use- 2 cups daily   Patient has 2 children.    Patient has some college.    Patient is right handed.          Works full time   Scientist, physiological Strain: Low Risk    Difficulty of Paying Living Expenses: Not hard at all  Food Insecurity: No Food Insecurity   Worried About Charity fundraiser in the Last Year: Never true   Arboriculturist in the Last Year: Never true  Transportation Needs: No Transportation Needs   Lack of Transportation (Medical): No   Lack of Transportation (Non-Medical): No  Physical Activity: Inactive   Days of Exercise per Week: 0 days   Minutes of Exercise per Session: 0 min  Stress: No Stress Concern Present   Feeling of Stress : Not at all  Social Connections: Socially Integrated   Frequency of Communication with Friends and Family: Three times a week   Frequency of Social Gatherings with Friends and Family: More than three times a week   Attends Religious Services: More than  4 times per year   Active Member of [employer] or Organizations: Yes   Attends Music therapist: More than 4 times per year   Marital Status: Married  Human resources officer Violence: Not At Risk   Fear of Current or Ex-Partner: No   Emotionally Abused: No   Physically Abused: No   Sexually Abused: No    Family History  Problem Relation Age of Onset   Heart disease Father    Diabetes Son    Kidney disease Neg Hx    Prostate cancer Neg Hx      Current Outpatient Medications:    amLODipine (NORVASC) 10 MG tablet, Take 1 tablet (10 mg total) by mouth daily. In the evening, Disp: 90 tablet, Rfl: 3   azelastine (ASTELIN) 0.1 % nasal spray, Place 1 spray into both nostrils 2 (two) times daily. Use in each nostril as directed, Disp: 30 mL, Rfl: 0   calcium carbonate (OSCAL) 1500 (  600 Ca) MG TABS tablet, Take by mouth., Disp: , Rfl:    Cholecalciferol (D3-1000 PO), Take by mouth daily., Disp: , Rfl:    clopidogrel (PLAVIX) 75 MG tablet, TAKE ONE TABLET BY MOUTH EVERY DAY, Disp: 90 tablet, Rfl: 1   DULoxetine (CYMBALTA) 20 MG capsule, TAKE 1 CAPSULE BY MOUTH EVERY DAY, Disp: 90 capsule, Rfl: 0   gabapentin (NEURONTIN) 300 MG capsule, TAKE 1 CAPSULE BY MOUTH THREE TIMES DAILY, Disp: 270 capsule, Rfl: 0   Ginsengs-Saw Palmetto (MULTI GINSENG & SAW PALMETTO) 500 MG CAPS, Take 500 mg by mouth daily., Disp: , Rfl:    losartan (COZAAR) 100 MG tablet, Take 1 tablet (100 mg total) by mouth in the morning., Disp: 90 tablet, Rfl: 3   meclizine (ANTIVERT) 25 MG tablet, TAKE 1 TABLET BY MOUTH 3 TIMES DAILY AS NEEDED FOR DIZZINESS, Disp: 90 tablet, Rfl: 0   Multiple Vitamin (MULITIVITAMIN WITH MINERALS) TABS, Take 1 tablet by mouth daily., Disp: , Rfl:    Naproxen Sodium 220 MG CAPS, Take by mouth as needed., Disp: , Rfl:    omeprazole (PRILOSEC) 40 MG capsule, TAKE 1 CAPSULE BY MOUTH ONCE DAILY, Disp: 90 capsule, Rfl: 0   simvastatin (ZOCOR) 40 MG tablet, TAKE ONE TABLET BY MOUTH DAILY, Disp: 30  tablet, Rfl: 0   sulfamethoxazole-trimethoprim (BACTRIM DS) 800-160 MG tablet, Take 1 tablet by mouth 2 (two) times daily for 7 days., Disp: 14 tablet, Rfl: 0   tadalafil (CIALIS) 20 MG tablet, Take 0.5-1 tablets (10-20 mg total) by mouth every other day as needed for erectile dysfunction., Disp: 5 tablet, Rfl: 11   tamsulosin (FLOMAX) 0.4 MG CAPS capsule, TAKE 1 CAPSULE BY MOUTH DAILY, Disp: 90 capsule, Rfl: 0   zinc gluconate 50 MG tablet, Take by mouth., Disp: , Rfl:  No current facility-administered medications for this visit.  Facility-Administered Medications Ordered in Other Visits:    epoetin alfa-epbx (RETACRIT) injection 40,000 Units, 40,000 Units, Subcutaneous, Weekly, Sindy Guadeloupe, MD, 40,000 Units at 04/14/21 1406  Physical exam:  Vitals:   07/14/21 1400 07/14/21 1436  BP: 139/67   Pulse: 68   Resp: 16 16  Temp: (!) 96.7 F (35.9 C)   TempSrc: Tympanic   SpO2: 99%   Weight: 191 lb (86.6 kg) 191 lb 9.6 oz (86.9 kg)  Height: '5\' 10"'  (1.778 m) '5\' 10"'  (1.778 m)   Physical Exam Constitutional:      General: He is not in acute distress. Cardiovascular:     Rate and Rhythm: Normal rate and regular rhythm.     Heart sounds: Normal heart sounds.  Pulmonary:     Effort: Pulmonary effort is normal.     Breath sounds: Normal breath sounds.  Abdominal:     General: Bowel sounds are normal.     Palpations: Abdomen is soft.  Skin:    General: Skin is warm and dry.  Neurological:     Mental Status: He is alert and oriented to person, place, and time.     CMP Latest Ref Rng & Units 04/14/2021  Glucose 70 - 99 mg/dL 106(H)  BUN 8 - 23 mg/dL 25(H)  Creatinine 0.61 - 1.24 mg/dL 1.23  Sodium 135 - 145 mmol/L 139  Potassium 3.5 - 5.1 mmol/L 4.4  Chloride 98 - 111 mmol/L 107  CO2 22 - 32 mmol/L 27  Calcium 8.9 - 10.3 mg/dL 8.1(L)  Total Protein 6.5 - 8.1 g/dL 6.0(L)  Total Bilirubin 0.3 - 1.2 mg/dL 0.6  Alkaline Phos  38 - 126 U/L 108  AST 15 - 41 U/L 25  ALT 0 - 44 U/L 20    CBC Latest Ref Rng & Units 07/14/2021  WBC 4.0 - 10.5 K/uL 2.8(L)  Hemoglobin 13.0 - 17.0 g/dL 9.9(L)  Hematocrit 39.0 - 52.0 % 30.7(L)  Platelets 150 - 400 K/uL 180    Assessment and plan- Patient is a 81 y.o. male with history of primary myelofibrosis and predominant anemia on Retacrit here for routine follow-up  Patient's hemoglobin had drifted down to 8.5-8.8 back in November 2022.  After starting Retacrit his hemoglobin has been closer to 10.  I have increased his Retacrit from 40,000 units to 50,000 units weekly last month.  We will continue to give him this present dose and I will see him in 3 months with CBC CMP LDH ferritin iron studies and B12.   Visit Diagnosis 1. Anemia of chronic kidney failure, stage 3 (moderate) ([place])   2. Myelofibrosis ([place])   3. Erythropoietin (EPO) stimulating agent anemia management patient      Dr. Randa Evens, MD, MPH [facility] [phone] 07/15/2021 11:02 AM

## 2021-07-21 ENCOUNTER — Inpatient Hospital Stay: Payer: Medicare Other

## 2021-07-21 ENCOUNTER — Other Ambulatory Visit: Payer: Self-pay

## 2021-07-21 VITALS — BP 148/75 | HR 65

## 2021-07-21 DIAGNOSIS — N183 Chronic kidney disease, stage 3 unspecified: Secondary | ICD-10-CM | POA: Diagnosis not present

## 2021-07-21 DIAGNOSIS — M5033 Other cervical disc degeneration, cervicothoracic region: Secondary | ICD-10-CM | POA: Diagnosis not present

## 2021-07-21 DIAGNOSIS — M9901 Segmental and somatic dysfunction of cervical region: Secondary | ICD-10-CM | POA: Diagnosis not present

## 2021-07-21 DIAGNOSIS — D7581 Myelofibrosis: Secondary | ICD-10-CM | POA: Diagnosis not present

## 2021-07-21 DIAGNOSIS — D6489 Other specified anemias: Secondary | ICD-10-CM

## 2021-07-21 DIAGNOSIS — M5412 Radiculopathy, cervical region: Secondary | ICD-10-CM | POA: Diagnosis not present

## 2021-07-21 DIAGNOSIS — D649 Anemia, unspecified: Secondary | ICD-10-CM

## 2021-07-21 DIAGNOSIS — D473 Essential (hemorrhagic) thrombocythemia: Secondary | ICD-10-CM | POA: Diagnosis not present

## 2021-07-21 DIAGNOSIS — D631 Anemia in chronic kidney disease: Secondary | ICD-10-CM | POA: Diagnosis not present

## 2021-07-21 DIAGNOSIS — M9903 Segmental and somatic dysfunction of lumbar region: Secondary | ICD-10-CM | POA: Diagnosis not present

## 2021-07-21 LAB — HEMOGLOBIN AND HEMATOCRIT, BLOOD
HCT: 31.4 % — ABNORMAL LOW (ref 39.0–52.0)
Hemoglobin: 9.6 g/dL — ABNORMAL LOW (ref 13.0–17.0)

## 2021-07-21 MED ORDER — EPOETIN ALFA-EPBX 10000 UNIT/ML IJ SOLN
10000.0000 [IU] | Freq: Once | INTRAMUSCULAR | Status: AC
  Administered 2021-07-21: 10000 [IU] via SUBCUTANEOUS
  Filled 2021-07-21: qty 1

## 2021-07-21 MED ORDER — SODIUM CHLORIDE 0.9% FLUSH
10.0000 mL | Freq: Once | INTRAVENOUS | Status: DC
  Filled 2021-07-21: qty 10

## 2021-07-21 MED ORDER — EPOETIN ALFA-EPBX 40000 UNIT/ML IJ SOLN
40000.0000 [IU] | Freq: Once | INTRAMUSCULAR | Status: AC
  Administered 2021-07-21: 40000 [IU] via SUBCUTANEOUS
  Filled 2021-07-21: qty 1

## 2021-07-21 MED ORDER — HEPARIN SOD (PORK) LOCK FLUSH 100 UNIT/ML IV SOLN
500.0000 [IU] | Freq: Once | INTRAVENOUS | Status: DC
  Filled 2021-07-21: qty 5

## 2021-07-21 MED ORDER — EPOETIN ALFA-EPBX 20000 UNIT/ML IJ SOLN: 50000.0000 [IU] | INTRAMUSCULAR | Status: DC

## 2021-07-22 ENCOUNTER — Ambulatory Visit (INDEPENDENT_AMBULATORY_CARE_PROVIDER_SITE_OTHER): Payer: Medicare Other | Admitting: Family Medicine

## 2021-07-22 ENCOUNTER — Encounter: Payer: Self-pay | Admitting: Family Medicine

## 2021-07-22 VITALS — BP 104/65 | HR 72 | Temp 98.3°F | Wt 190.4 lb

## 2021-07-22 DIAGNOSIS — D649 Anemia, unspecified: Secondary | ICD-10-CM | POA: Diagnosis not present

## 2021-07-22 DIAGNOSIS — D474 Osteomyelofibrosis: Secondary | ICD-10-CM | POA: Diagnosis not present

## 2021-07-22 DIAGNOSIS — D7581 Myelofibrosis: Secondary | ICD-10-CM

## 2021-07-22 DIAGNOSIS — K219 Gastro-esophageal reflux disease without esophagitis: Secondary | ICD-10-CM

## 2021-07-22 DIAGNOSIS — D709 Neutropenia, unspecified: Secondary | ICD-10-CM | POA: Diagnosis not present

## 2021-07-22 DIAGNOSIS — I129 Hypertensive chronic kidney disease with stage 1 through stage 4 chronic kidney disease, or unspecified chronic kidney disease: Secondary | ICD-10-CM | POA: Diagnosis not present

## 2021-07-22 DIAGNOSIS — E782 Mixed hyperlipidemia: Secondary | ICD-10-CM | POA: Diagnosis not present

## 2021-07-22 DIAGNOSIS — E538 Deficiency of other specified B group vitamins: Secondary | ICD-10-CM

## 2021-07-22 LAB — URINALYSIS, ROUTINE W REFLEX MICROSCOPIC
Bilirubin, UA: NEGATIVE
Glucose, UA: NEGATIVE
Ketones, UA: NEGATIVE
Leukocytes,UA: NEGATIVE
Nitrite, UA: NEGATIVE
Specific Gravity, UA: 1.025 (ref 1.005–1.030)
Urobilinogen, Ur: 0.2 mg/dL (ref 0.2–1.0)
pH, UA: 5.5 (ref 5.0–7.5)

## 2021-07-22 LAB — MICROALBUMIN, URINE WAIVED
Creatinine, Urine Waived: 300 mg/dL (ref 10–300)
Microalb, Ur Waived: 150 mg/L — ABNORMAL HIGH (ref 0–19)

## 2021-07-22 LAB — MICROSCOPIC EXAMINATION
Epithelial Cells (non renal): NONE SEEN /hpf (ref 0–10)
WBC, UA: NONE SEEN /hpf (ref 0–5)

## 2021-07-22 MED ORDER — TAMSULOSIN HCL 0.4 MG PO CAPS: 0.4000 mg | ORAL_CAPSULE | Freq: Every day | ORAL | 1 refills | Status: DC

## 2021-07-22 MED ORDER — GABAPENTIN 300 MG PO CAPS
300.0000 mg | ORAL_CAPSULE | Freq: Two times a day (BID) | ORAL | 1 refills | Status: DC
Start: 2021-07-22 — End: 2021-10-01

## 2021-07-22 MED ORDER — MECLIZINE HCL 25 MG PO TABS: ORAL_TABLET | ORAL | 6 refills | Status: DC

## 2021-07-22 MED ORDER — SIMVASTATIN 40 MG PO TABS: 40.0000 mg | ORAL_TABLET | Freq: Every day | ORAL | 1 refills | Status: DC

## 2021-07-22 MED ORDER — TADALAFIL 20 MG PO TABS: 10.0000 mg | ORAL_TABLET | ORAL | 11 refills | Status: DC | PRN

## 2021-07-22 MED ORDER — DULOXETINE HCL 20 MG PO CPEP: ORAL_CAPSULE | ORAL | 1 refills | Status: DC

## 2021-07-22 MED ORDER — OMEPRAZOLE 40 MG PO CPDR: 40.0000 mg | DELAYED_RELEASE_CAPSULE | Freq: Every day | ORAL | 0 refills | Status: DC

## 2021-07-22 NOTE — Progress Notes (Signed)
BP 104/65    Pulse 72    Temp 98.3 F (36.8 C)    Wt 190 lb 6.4 oz (86.4 kg)    SpO2 96%    BMI 27.32 kg/m    Subjective:    Patient ID: [name], male    DOB: 1940/10/19, 81 y.o.   MRN: [id number]  HPI: [name] is a 81 y.o. male  Chief Complaint  Patient presents with   Hypertension   Gastroesophageal Reflux   Hyperlipidemia   Anemia   HYPERTENSION / HYPERLIPIDEMIA Satisfied with current treatment? yes Duration of hypertension: chronic BP monitoring frequency: not checking BP medication side effects: no Past BP meds: amlodipine, losartan Duration of hyperlipidemia: chronic Cholesterol medication side effects: no Cholesterol supplements: none Past cholesterol medications: simvastatin Medication compliance: excellent compliance Aspirin: no Recent stressors: no Recurrent headaches: no Visual changes: no Palpitations: no Dyspnea: no Chest pain: no Lower extremity edema: no Dizzy/lightheaded: no  DEPRESSION Mood status: controlled Satisfied with current treatment?: yes Symptom severity: mild  Duration of current treatment : chronic Side effects: no Medication compliance: excellent compliance Psychotherapy/counseling: no  Previous psychiatric medications: cymbalta Depressed mood: no Anxious mood: no Anhedonia: no Significant weight loss or gain: no Insomnia: no  Fatigue: yes Feelings of worthlessness or guilt: no Impaired concentration/indecisiveness: no Suicidal ideations: no Hopelessness: no Crying spells: no Depression screen [facility] 2/9 07/22/2021 07/13/2021 07/12/2021 05/14/2021 07/10/2020  Decreased Interest 0 0 0 0 0  Down, Depressed, Hopeless 0 0 0 0 0  PHQ - 2 Score 0 0 0 0 0  Altered sleeping 0 1 0 0 -  Tired, decreased energy 0 1 0 0 -  Change in appetite 0 0 0 0 -  Feeling bad or failure about yourself  0 0 0 0 -  Trouble concentrating 0 0 0 0 -  Moving slowly or fidgety/restless 0 0 0 0 -  Suicidal thoughts 0 0 0 0 -  PHQ-9 Score 0 2 0 0  -  Difficult doing work/chores - - - - -  Some recent data might be hidden   BPH BPH status: controlled Satisfied with current treatment?: no Medication side effects: no Medication compliance: excellent compliance Duration: chronic Nocturia: 1/night Urinary frequency:no Incomplete voiding: no Urgency: no Weak urinary stream: no Straining to start stream: no Dysuria: no Onset: gradual Severity: mild   Relevant past medical, surgical, family and social history reviewed and updated as indicated. Interim medical history since our last visit reviewed. Allergies and medications reviewed and updated.  Review of Systems  Constitutional: Negative.   Respiratory: Negative.    Cardiovascular: Negative.   Gastrointestinal: Negative.   Musculoskeletal: Negative.   Neurological: Negative.   Psychiatric/Behavioral: Negative.     Per HPI unless specifically indicated above     Objective:    BP 104/65    Pulse 72    Temp 98.3 F (36.8 C)    Wt 190 lb 6.4 oz (86.4 kg)    SpO2 96%    BMI 27.32 kg/m   Wt Readings from Last 3 Encounters:  07/22/21 190 lb 6.4 oz (86.4 kg)  07/14/21 191 lb 9.6 oz (86.9 kg)  07/13/21 190 lb 3.2 oz (86.3 kg)    Physical Exam Vitals and nursing note reviewed.  Constitutional:      General: He is not in acute distress.    Appearance: Normal appearance. He is not ill-appearing, toxic-appearing or diaphoretic.  HENT:     Head: Normocephalic and atraumatic.  Right Ear: External ear normal.     Left Ear: External ear normal.     Nose: Nose normal.     Mouth/Throat:     Mouth: Mucous membranes are moist.     Pharynx: Oropharynx is clear.  Eyes:     General: No scleral icterus.       Right eye: No discharge.        Left eye: No discharge.     Extraocular Movements: Extraocular movements intact.     Conjunctiva/sclera: Conjunctivae normal.     Pupils: Pupils are equal, round, and reactive to light.  Cardiovascular:     Rate and Rhythm: Normal rate  and regular rhythm.     Pulses: Normal pulses.     Heart sounds: Normal heart sounds. No murmur heard.   No friction rub. No gallop.  Pulmonary:     Effort: Pulmonary effort is normal. No respiratory distress.     Breath sounds: Normal breath sounds. No stridor. No wheezing, rhonchi or rales.  Chest:     Chest wall: No tenderness.  Musculoskeletal:        General: Normal range of motion.     Cervical back: Normal range of motion and neck supple.  Skin:    General: Skin is warm and dry.     Capillary Refill: Capillary refill takes less than 2 seconds.     Coloration: Skin is not jaundiced or pale.     Findings: No bruising, erythema, lesion or rash.  Neurological:     General: No focal deficit present.     Mental Status: He is alert and oriented to person, place, and time. Mental status is at baseline.  Psychiatric:        Mood and Affect: Mood normal.        Behavior: Behavior normal.        Thought Content: Thought content normal.        Judgment: Judgment normal.    Results for orders placed or performed in visit on 07/22/21  Microscopic Examination   Urine  Result Value Ref Range   WBC, UA None seen 0 - 5 /hpf   RBC 0-2 0 - 2 /hpf   Epithelial Cells (non renal) None seen 0 - 10 /hpf   Mucus, UA Present (A) Not Estab.   Bacteria, UA Few (A) None seen/Few  Comprehensive metabolic panel  Result Value Ref Range   Glucose 85 70 - 99 mg/dL   BUN 26 8 - 27 mg/dL   Creatinine, Ser 1.81 (H) 0.76 - 1.27 mg/dL   eGFR 37 (L) >59 mL/min/1.73   BUN/Creatinine Ratio 14 10 - 24   Sodium 142 134 - 144 mmol/L   Potassium 5.1 3.5 - 5.2 mmol/L   Chloride 110 (H) 96 - 106 mmol/L   CO2 20 20 - 29 mmol/L   Calcium 8.3 (L) 8.6 - 10.2 mg/dL   Total Protein 5.4 (L) 6.0 - 8.5 g/dL   Albumin 3.7 3.6 - 4.6 g/dL   Globulin, Total 1.7 1.5 - 4.5 g/dL   Albumin/Globulin Ratio 2.2 1.2 - 2.2   Bilirubin Total 0.4 0.0 - 1.2 mg/dL   Alkaline Phosphatase 130 (H) 44 - 121 IU/L   AST 27 0 - 40 IU/L    ALT 19 0 - 44 IU/L  CBC with Differential/Platelet  Result Value Ref Range   WBC 2.9 (L) 3.4 - 10.8 x10E3/uL   RBC 3.45 (L) 4.14 - 5.80 x10E6/uL   Hemoglobin 9.9 (L)  13.0 - 17.7 g/dL   Hematocrit 30.3 (L) 37.5 - 51.0 %   MCV 88 79 - 97 fL   MCH 28.7 26.6 - 33.0 pg   MCHC 32.7 31.5 - 35.7 g/dL   RDW 17.1 (H) 11.6 - 15.4 %   Platelets 201 150 - 450 x10E3/uL   Neutrophils 55 Not Estab. %   Lymphs 26 Not Estab. %   Monocytes 8 Not Estab. %   Eos 0 Not Estab. %   Basos 2 Not Estab. %   Immature Cells Note    Neutrophils Absolute 1.7 1.4 - 7.0 x10E3/uL   Lymphocytes Absolute 0.8 0.7 - 3.1 x10E3/uL   Monocytes Absolute 0.2 0.1 - 0.9 x10E3/uL   EOS (ABSOLUTE) 0.0 0.0 - 0.4 x10E3/uL   Basophils Absolute 0.1 0.0 - 0.2 x10E3/uL   NRBC 13 (H) 0 - 0 %   Hematology Comments: Note:   Lipid Panel w/o Chol/HDL Ratio  Result Value Ref Range   Cholesterol, Total 134 100 - 199 mg/dL   Triglycerides 91 0 - 149 mg/dL   HDL 37 (L) >39 mg/dL   VLDL Cholesterol Cal 17 5 - 40 mg/dL   LDL Chol Calc (NIH) 80 0 - 99 mg/dL  TSH  Result Value Ref Range   TSH 5.660 (H) 0.450 - 4.500 uIU/mL  Urinalysis, Routine w reflex microscopic  Result Value Ref Range   Specific Gravity, UA 1.025 1.005 - 1.030   pH, UA 5.5 5.0 - 7.5   Color, UA Yellow Yellow   Appearance Ur Clear Clear   Leukocytes,UA Negative Negative   Protein,UA 3+ (A) Negative/Trace   Glucose, UA Negative Negative   Ketones, UA Negative Negative   RBC, UA Trace (A) Negative   Bilirubin, UA Negative Negative   Urobilinogen, Ur 0.2 0.2 - 1.0 mg/dL   Nitrite, UA Negative Negative   Microscopic Examination See below:   Microalbumin, Urine Waived  Result Value Ref Range   Microalb, Ur Waived 150 (H) 0 - 19 mg/L   Creatinine, Urine Waived 300 10 - 300 mg/dL   Microalb/Creat Ratio 30-300 (H) <30 mg/g  B12  Result Value Ref Range   Vitamin B-12 783 232 - 1,245 pg/mL  Immature Cells  Result Value Ref Range   Bands(Auto) Relative 2 Not  Estab. %   Metamyelocytes 7 (H) 0 - 0 %      Assessment & Plan:   Problem List Items Addressed This Visit       Digestive   Gastroesophageal reflux disease    Under good control on current regimen. Continue current regimen. Continue to monitor. Call with any concerns. Refills given. Labs drawn today.       Relevant Medications   omeprazole (PRILOSEC) 40 MG capsule   meclizine (ANTIVERT) 25 MG tablet   Other Relevant Orders   CBC with Differential/Platelet (Completed)     Genitourinary   Benign hypertensive renal disease - Primary    Under good control on current regimen. Continue current regimen. Continue to monitor. Call with any concerns. Refills through cardiology. Labs drawn today.       Relevant Orders   Comprehensive metabolic panel (Completed)   TSH (Completed)   Urinalysis, Routine w reflex microscopic (Completed)   Microalbumin, Urine Waived (Completed)     Other   Myelofibrosis ([place])    Following with hematology. Continue to monitor. Call with any concerns.       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 Continue to monitor. Call  with any concerns.       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 Continue to monitor. Call with any concerns.       Relevant Medications   meclizine (ANTIVERT) 25 MG tablet   Mixed hyperlipidemia    Under good control on current regimen. Continue current regimen. Continue to monitor. Call with any concerns. Refills given. Labs drawn today.       Relevant Medications   simvastatin (ZOCOR) 40 MG tablet   tadalafil (CIALIS) 20 MG tablet   Other Relevant Orders   Comprehensive metabolic panel (Completed)   Lipid Panel w/o Chol/HDL Ratio (Completed)   Anemia    Following with hematology. Continue to monitor. Call with any concerns.       Low vitamin B12 level    Rechecking labs today. Await results. Treat as needed.       Relevant Orders   B12 (Completed)     Follow up  plan: No follow-ups on file.

## 2021-07-23 ENCOUNTER — Encounter: Payer: Self-pay | Admitting: Family Medicine

## 2021-07-23 DIAGNOSIS — E039 Hypothyroidism, unspecified: Secondary | ICD-10-CM | POA: Insufficient documentation

## 2021-07-23 DIAGNOSIS — E038 Other specified hypothyroidism: Secondary | ICD-10-CM | POA: Insufficient documentation

## 2021-07-23 LAB — COMPREHENSIVE METABOLIC PANEL
ALT: 19 IU/L (ref 0–44)
AST: 27 IU/L (ref 0–40)
Albumin/Globulin Ratio: 2.2 (ref 1.2–2.2)
Albumin: 3.7 g/dL (ref 3.6–4.6)
Alkaline Phosphatase: 130 IU/L — ABNORMAL HIGH (ref 44–121)
BUN/Creatinine Ratio: 14 (ref 10–24)
BUN: 26 mg/dL (ref 8–27)
Bilirubin Total: 0.4 mg/dL (ref 0.0–1.2)
CO2: 20 mmol/L (ref 20–29)
Calcium: 8.3 mg/dL — ABNORMAL LOW (ref 8.6–10.2)
Chloride: 110 mmol/L — ABNORMAL HIGH (ref 96–106)
Creatinine, Ser: 1.81 mg/dL — ABNORMAL HIGH (ref 0.76–1.27)
Globulin, Total: 1.7 g/dL (ref 1.5–4.5)
Glucose: 85 mg/dL (ref 70–99)
Potassium: 5.1 mmol/L (ref 3.5–5.2)
Sodium: 142 mmol/L (ref 134–144)
Total Protein: 5.4 g/dL — ABNORMAL LOW (ref 6.0–8.5)
eGFR: 37 mL/min/{1.73_m2} — ABNORMAL LOW (ref >59)

## 2021-07-23 LAB — CBC WITH DIFFERENTIAL/PLATELET
Basophils Absolute: 0.1 10*3/uL (ref 0.0–0.2)
Basos: 2 %
EOS (ABSOLUTE): 0 10*3/uL (ref 0.0–0.4)
Eos: 0 %
Hematocrit: 30.3 % — ABNORMAL LOW (ref 37.5–51.0)
Hemoglobin: 9.9 g/dL — ABNORMAL LOW (ref 13.0–17.7)
Lymphocytes Absolute: 0.8 10*3/uL (ref 0.7–3.1)
Lymphs: 26 %
MCH: 28.7 pg (ref 26.6–33.0)
MCHC: 32.7 g/dL (ref 31.5–35.7)
MCV: 88 fL (ref 79–97)
Monocytes Absolute: 0.2 10*3/uL (ref 0.1–0.9)
Monocytes: 8 %
NRBC: 13 % — ABNORMAL HIGH (ref 0–0)
Neutrophils Absolute: 1.7 10*3/uL (ref 1.4–7.0)
Neutrophils: 55 %
Platelets: 201 10*3/uL (ref 150–450)
RBC: 3.45 x10E6/uL — ABNORMAL LOW (ref 4.14–5.80)
RDW: 17.1 % — ABNORMAL HIGH (ref 11.6–15.4)
WBC: 2.9 10*3/uL — ABNORMAL LOW (ref 3.4–10.8)

## 2021-07-23 LAB — VITAMIN B12: Vitamin B-12: 783 pg/mL (ref 232–1245)

## 2021-07-23 LAB — IMMATURE CELLS
Bands(Auto) Relative: 2 %
Metamyelocytes: 7 % — ABNORMAL HIGH (ref 0–0)

## 2021-07-23 LAB — LIPID PANEL W/O CHOL/HDL RATIO
Cholesterol, Total: 134 mg/dL (ref 100–199)
HDL: 37 mg/dL — ABNORMAL LOW (ref >39)
LDL Chol Calc (NIH): 80 mg/dL (ref 0–99)
Triglycerides: 91 mg/dL (ref 0–149)
VLDL Cholesterol Cal: 17 mg/dL (ref 5–40)

## 2021-07-23 LAB — TSH: TSH: 5.66 u[IU]/mL — ABNORMAL HIGH (ref 0.450–4.500)

## 2021-07-23 NOTE — Assessment & Plan Note (Addendum)
Under good control on current regimen. Continue current regimen. Continue to monitor. Call with any concerns. Refills given. Labs drawn today.   

## 2021-07-23 NOTE — Assessment & Plan Note (Signed)
Rechecking labs today. Await results. Treat as needed.  °

## 2021-07-23 NOTE — Assessment & Plan Note (Signed)
Following with hematology. Continue to monitor. Call with any concerns.

## 2021-07-23 NOTE — Assessment & Plan Note (Signed)
Under good control on current regimen. Continue current regimen. Continue to monitor. Call with any concerns. Refills given. Labs drawn today.   

## 2021-07-23 NOTE — Assessment & Plan Note (Signed)
Under good control on current regimen. Continue current regimen. Continue to monitor. Call with any concerns. Refills through cardiology. Labs drawn today. 

## 2021-07-28 ENCOUNTER — Inpatient Hospital Stay: Payer: Medicare Other

## 2021-07-28 ENCOUNTER — Other Ambulatory Visit: Payer: Self-pay

## 2021-07-28 DIAGNOSIS — D631 Anemia in chronic kidney disease: Secondary | ICD-10-CM

## 2021-07-28 DIAGNOSIS — D7581 Myelofibrosis: Secondary | ICD-10-CM | POA: Diagnosis not present

## 2021-07-28 DIAGNOSIS — N183 Chronic kidney disease, stage 3 unspecified: Secondary | ICD-10-CM

## 2021-07-28 DIAGNOSIS — D649 Anemia, unspecified: Secondary | ICD-10-CM

## 2021-07-28 DIAGNOSIS — D473 Essential (hemorrhagic) thrombocythemia: Secondary | ICD-10-CM | POA: Diagnosis not present

## 2021-07-28 LAB — HEMOGLOBIN AND HEMATOCRIT, BLOOD
HCT: 31.9 % — ABNORMAL LOW (ref 39.0–52.0)
Hemoglobin: 10 g/dL — ABNORMAL LOW (ref 13.0–17.0)

## 2021-08-04 ENCOUNTER — Inpatient Hospital Stay: Payer: Medicare Other

## 2021-08-04 ENCOUNTER — Inpatient Hospital Stay: Payer: Medicare Other | Attending: Oncology

## 2021-08-04 DIAGNOSIS — D473 Essential (hemorrhagic) thrombocythemia: Secondary | ICD-10-CM | POA: Insufficient documentation

## 2021-08-04 DIAGNOSIS — D7581 Myelofibrosis: Secondary | ICD-10-CM | POA: Insufficient documentation

## 2021-08-11 ENCOUNTER — Inpatient Hospital Stay: Payer: Medicare Other

## 2021-08-11 ENCOUNTER — Other Ambulatory Visit: Payer: Self-pay

## 2021-08-11 DIAGNOSIS — M9901 Segmental and somatic dysfunction of cervical region: Secondary | ICD-10-CM | POA: Diagnosis not present

## 2021-08-11 DIAGNOSIS — M9903 Segmental and somatic dysfunction of lumbar region: Secondary | ICD-10-CM | POA: Diagnosis not present

## 2021-08-11 DIAGNOSIS — D631 Anemia in chronic kidney disease: Secondary | ICD-10-CM

## 2021-08-11 DIAGNOSIS — D7581 Myelofibrosis: Secondary | ICD-10-CM | POA: Diagnosis not present

## 2021-08-11 DIAGNOSIS — M5033 Other cervical disc degeneration, cervicothoracic region: Secondary | ICD-10-CM | POA: Diagnosis not present

## 2021-08-11 DIAGNOSIS — D649 Anemia, unspecified: Secondary | ICD-10-CM

## 2021-08-11 DIAGNOSIS — M5412 Radiculopathy, cervical region: Secondary | ICD-10-CM | POA: Diagnosis not present

## 2021-08-11 DIAGNOSIS — D473 Essential (hemorrhagic) thrombocythemia: Secondary | ICD-10-CM | POA: Diagnosis not present

## 2021-08-11 LAB — HEMOGLOBIN AND HEMATOCRIT, BLOOD
HCT: 30.9 % — ABNORMAL LOW (ref 39.0–52.0)
Hemoglobin: 10.1 g/dL — ABNORMAL LOW (ref 13.0–17.0)

## 2021-08-18 ENCOUNTER — Inpatient Hospital Stay: Payer: Medicare Other

## 2021-08-18 ENCOUNTER — Other Ambulatory Visit: Payer: Self-pay

## 2021-08-18 VITALS — BP 163/71 | HR 65

## 2021-08-18 DIAGNOSIS — D631 Anemia in chronic kidney disease: Secondary | ICD-10-CM

## 2021-08-18 DIAGNOSIS — D473 Essential (hemorrhagic) thrombocythemia: Secondary | ICD-10-CM | POA: Diagnosis not present

## 2021-08-18 DIAGNOSIS — D7581 Myelofibrosis: Secondary | ICD-10-CM

## 2021-08-18 DIAGNOSIS — D649 Anemia, unspecified: Secondary | ICD-10-CM

## 2021-08-18 LAB — HEMOGLOBIN AND HEMATOCRIT, BLOOD
HCT: 28.6 % — ABNORMAL LOW (ref 39.0–52.0)
Hemoglobin: 9 g/dL — ABNORMAL LOW (ref 13.0–17.0)

## 2021-08-18 MED ORDER — EPOETIN ALFA-EPBX 10000 UNIT/ML IJ SOLN
10000.0000 [IU] | Freq: Once | INTRAMUSCULAR | Status: AC
  Administered 2021-08-18: 10000 [IU] via SUBCUTANEOUS
  Filled 2021-08-18: qty 1

## 2021-08-18 MED ORDER — EPOETIN ALFA-EPBX 40000 UNIT/ML IJ SOLN
40000.0000 [IU] | Freq: Once | INTRAMUSCULAR | Status: AC
  Administered 2021-08-18: 40000 [IU] via SUBCUTANEOUS
  Filled 2021-08-18: qty 1

## 2021-08-18 MED ORDER — EPOETIN ALFA-EPBX 20000 UNIT/ML IJ SOLN: 50000.0000 [IU] | INTRAMUSCULAR | Status: DC

## 2021-08-25 ENCOUNTER — Inpatient Hospital Stay: Payer: Medicare Other

## 2021-08-26 ENCOUNTER — Other Ambulatory Visit: Payer: Self-pay

## 2021-08-26 ENCOUNTER — Inpatient Hospital Stay: Payer: Medicare Other

## 2021-08-26 VITALS — BP 160/72 | HR 61

## 2021-08-26 DIAGNOSIS — N183 Chronic kidney disease, stage 3 unspecified: Secondary | ICD-10-CM

## 2021-08-26 DIAGNOSIS — D649 Anemia, unspecified: Secondary | ICD-10-CM

## 2021-08-26 DIAGNOSIS — D7581 Myelofibrosis: Secondary | ICD-10-CM

## 2021-08-26 DIAGNOSIS — D473 Essential (hemorrhagic) thrombocythemia: Secondary | ICD-10-CM | POA: Diagnosis not present

## 2021-08-26 LAB — HEMOGLOBIN AND HEMATOCRIT, BLOOD
HCT: 28.2 % — ABNORMAL LOW (ref 39.0–52.0)
Hemoglobin: 8.7 g/dL — ABNORMAL LOW (ref 13.0–17.0)

## 2021-08-26 MED ORDER — EPOETIN ALFA-EPBX 40000 UNIT/ML IJ SOLN
40000.0000 [IU] | Freq: Once | INTRAMUSCULAR | Status: AC
  Administered 2021-08-26: 40000 [IU] via SUBCUTANEOUS
  Filled 2021-08-26: qty 1

## 2021-08-26 MED ORDER — EPOETIN ALFA-EPBX 10000 UNIT/ML IJ SOLN
10000.0000 [IU] | Freq: Once | INTRAMUSCULAR | Status: AC
  Administered 2021-08-26: 10000 [IU] via SUBCUTANEOUS
  Filled 2021-08-26: qty 1

## 2021-08-26 MED ORDER — EPOETIN ALFA-EPBX 20000 UNIT/ML IJ SOLN: 50000.0000 [IU] | INTRAMUSCULAR | Status: DC

## 2021-09-01 ENCOUNTER — Inpatient Hospital Stay: Payer: Medicare Other

## 2021-09-01 ENCOUNTER — Other Ambulatory Visit: Payer: Self-pay

## 2021-09-01 ENCOUNTER — Other Ambulatory Visit: Payer: Medicare Other

## 2021-09-01 ENCOUNTER — Ambulatory Visit: Payer: Medicare Other

## 2021-09-01 DIAGNOSIS — D7581 Myelofibrosis: Secondary | ICD-10-CM | POA: Diagnosis not present

## 2021-09-01 DIAGNOSIS — M5412 Radiculopathy, cervical region: Secondary | ICD-10-CM | POA: Diagnosis not present

## 2021-09-01 DIAGNOSIS — M9903 Segmental and somatic dysfunction of lumbar region: Secondary | ICD-10-CM | POA: Diagnosis not present

## 2021-09-01 DIAGNOSIS — D473 Essential (hemorrhagic) thrombocythemia: Secondary | ICD-10-CM | POA: Diagnosis not present

## 2021-09-01 DIAGNOSIS — M9901 Segmental and somatic dysfunction of cervical region: Secondary | ICD-10-CM | POA: Diagnosis not present

## 2021-09-01 DIAGNOSIS — M5033 Other cervical disc degeneration, cervicothoracic region: Secondary | ICD-10-CM | POA: Diagnosis not present

## 2021-09-01 DIAGNOSIS — Z79899 Other long term (current) drug therapy: Secondary | ICD-10-CM

## 2021-09-01 DIAGNOSIS — D631 Anemia in chronic kidney disease: Secondary | ICD-10-CM

## 2021-09-01 LAB — HEMOGLOBIN AND HEMATOCRIT, BLOOD
HCT: 33 % — ABNORMAL LOW (ref 39.0–52.0)
Hemoglobin: 10.2 g/dL — ABNORMAL LOW (ref 13.0–17.0)

## 2021-09-07 ENCOUNTER — Encounter: Payer: Self-pay | Admitting: Family Medicine

## 2021-09-07 ENCOUNTER — Ambulatory Visit (INDEPENDENT_AMBULATORY_CARE_PROVIDER_SITE_OTHER): Payer: Medicare Other | Admitting: Family Medicine

## 2021-09-07 VITALS — BP 115/65 | HR 69 | Temp 98.0°F | Wt 190.6 lb

## 2021-09-07 DIAGNOSIS — R051 Acute cough: Secondary | ICD-10-CM

## 2021-09-07 DIAGNOSIS — R350 Frequency of micturition: Secondary | ICD-10-CM | POA: Diagnosis not present

## 2021-09-07 MED ORDER — TRIAMCINOLONE ACETONIDE 40 MG/ML IJ SUSP
40.0000 mg | Freq: Once | INTRAMUSCULAR | Status: AC
  Administered 2021-09-07: 40 mg via INTRAMUSCULAR

## 2021-09-07 MED ORDER — TAMSULOSIN HCL 0.4 MG PO CAPS
0.8000 mg | ORAL_CAPSULE | Freq: Every day | ORAL | 1 refills | Status: DC
Start: 2021-09-07 — End: 2022-01-20

## 2021-09-07 MED ORDER — PREDNISONE 50 MG PO TABS: 50.0000 mg | ORAL_TABLET | Freq: Every day | ORAL | 0 refills | Status: DC

## 2021-09-07 NOTE — Progress Notes (Signed)
? ?BP 115/65   Pulse 69   Temp 98 ?F (36.7 ?C)   Wt 190 lb 9.6 oz (86.5 kg)   SpO2 97%   BMI 27.35 kg/m?   ? ?Subjective:  ? ? Patient ID: [name], male    DOB: 1941-04-07, 81 y.o.   MRN: [id number] ? ?HPI: ?[name] is a 81 y.o. male ? ?Chief Complaint  ?Patient presents with  ? Cough  ?  Patient states he has been coughing and has chest congestion for about 5 days. Patient is taking coridin but doesn't seem to help.   ? Urinary Frequency  ?  Patient states he is getting up about 2-3 times a night to use the bathroom. Patient states he feels a little unsteady on his feet. Patient was on abx for uti in February and is worried it didn't clear up  ? ?UPPER RESPIRATORY TRACT INFECTION ?Duration: 5 days ?Worst symptom: cough ?Fever: no ?Cough: yes ?Shortness of breath: no ?Wheezing: no ?Chest pain: no ?Chest tightness: no ?Chest congestion: yes ?Nasal congestion: no ?Runny nose: no ?Post nasal drip: no ?Sneezing: no ?Sore throat: yes ?Swollen glands: no ?Sinus pressure: no ?Headache: no ?Face pain: no ?Toothache: no ?Ear pain: no  ?Ear pressure: no  ?Eyes red/itching:no ?Eye drainage/crusting: no  ?Vomiting: no ?Rash: no ?Fatigue: yes ?Sick contacts: yes ?Strep contacts: no  ?Context: worse ?Recurrent sinusitis: no ?Relief with OTC cold/cough medications: no  ?Treatments attempted: anti-histamine  ? ?URINARY SYMPTOMS ?Duration: a couple of months ?Dysuria: no ?Urinary frequency: yes ?Urgency: yes ?Small volume voids: no ?Symptom severity: moderate ?Urinary incontinence: no ?Foul odor: yes ?Hematuria: no ?Abdominal pain: no ?Back pain: no ?Suprapubic pain/pressure: no ?Flank pain: no ?Fever:  no ?Vomiting: no ?Relief with cranberry juice: no ?Relief with pyridium: no ?Status: stable ?Previous urinary tract infection: yes ?Recurrent urinary tract infection: no ?Penile discharge: no ?Treatments attempted: increasing fluids  ? ?Relevant past medical, surgical, family and social history reviewed and updated as  indicated. Interim medical history since our last visit reviewed. ?Allergies and medications reviewed and updated. ? ?Review of Systems  ?Constitutional: Negative.   ?Respiratory:  Positive for cough. Negative for apnea, choking, chest tightness, shortness of breath, wheezing and stridor.   ?Cardiovascular: Negative.   ?Gastrointestinal: Negative.   ?Psychiatric/Behavioral: Negative.    ? ?Per HPI unless specifically indicated above ? ?   ?Objective:  ?  ?BP 115/65   Pulse 69   Temp 98 ?F (36.7 ?C)   Wt 190 lb 9.6 oz (86.5 kg)   SpO2 97%   BMI 27.35 kg/m?   ?Wt Readings from Last 3 Encounters:  ?09/07/21 190 lb 9.6 oz (86.5 kg)  ?07/22/21 190 lb 6.4 oz (86.4 kg)  ?07/14/21 191 lb 9.6 oz (86.9 kg)  ?  ?Physical Exam ?Vitals and nursing note reviewed.  ?Constitutional:   ?   General: He is not in acute distress. ?   Appearance: Normal appearance. He is not ill-appearing, toxic-appearing or diaphoretic.  ?HENT:  ?   Head: Normocephalic and atraumatic.  ?   Right Ear: Tympanic membrane, ear canal and external ear normal. There is no impacted cerumen.  ?   Left Ear: Tympanic membrane, ear canal and external ear normal. There is no impacted cerumen.  ?   Nose: Nose normal. No congestion or rhinorrhea.  ?   Mouth/Throat:  ?   Mouth: Mucous membranes are moist.  ?   Pharynx: Oropharynx is clear. No oropharyngeal exudate or posterior oropharyngeal  erythema.  ?Eyes:  ?   General: No scleral icterus.    ?   Right eye: No discharge.     ?   Left eye: No discharge.  ?   Extraocular Movements: Extraocular movements intact.  ?   Conjunctiva/sclera: Conjunctivae normal.  ?   Pupils: Pupils are equal, round, and reactive to light.  ?Cardiovascular:  ?   Rate and Rhythm: Normal rate and regular rhythm.  ?   Pulses: Normal pulses.  ?   Heart sounds: Normal heart sounds. No murmur heard. ?  No friction rub. No gallop.  ?Pulmonary:  ?   Effort: Pulmonary effort is normal. No respiratory distress.  ?   Breath sounds: Normal breath  sounds. No stridor. No wheezing, rhonchi or rales.  ?Chest:  ?   Chest wall: No tenderness.  ?Musculoskeletal:     ?   General: Normal range of motion.  ?   Cervical back: Normal range of motion and neck supple.  ?Skin: ?   General: Skin is warm and dry.  ?   Capillary Refill: Capillary refill takes less than 2 seconds.  ?   Coloration: Skin is not jaundiced or pale.  ?   Findings: No bruising, erythema, lesion or rash.  ?Neurological:  ?   General: No focal deficit present.  ?   Mental Status: He is alert and oriented to person, place, and time. Mental status is at baseline.  ?Psychiatric:     ?   Mood and Affect: Mood normal.     ?   Behavior: Behavior normal.     ?   Thought Content: Thought content normal.     ?   Judgment: Judgment normal.  ? ? ?Results for orders placed or performed in visit on 09/07/21  ?Microscopic Examination  ?Result Value Ref Range  ? WBC, UA None seen 0 - 5 /hpf  ? RBC 0-2 0 - 2 /hpf  ? Epithelial Cells (non renal) None seen 0 - 10 /hpf  ? Mucus, UA Present (A) Not Estab.  ? Bacteria, UA None seen None seen/Few  ?Rapid Strep Screen ([facility] ONLY)  ? Urine  ?Result Value Ref Range  ? Strep Gp A Ag, IA W/Reflex Negative Negative  ?Culture, Group A Strep  ? Urine  ?Result Value Ref Range  ? Strep A Culture WILL FOLLOW   ?Urinalysis, Routine w reflex microscopic  ?Result Value Ref Range  ? Specific Gravity, UA >1.030 (H) 1.005 - 1.030  ? pH, UA 5.5 5.0 - 7.5  ? Color, UA Yellow Yellow  ? Appearance Ur Cloudy (A) Clear  ? Leukocytes,UA Negative Negative  ? Protein,UA 3+ (A) Negative/Trace  ? Glucose, UA Negative Negative  ? Ketones, UA Negative Negative  ? RBC, UA Trace (A) Negative  ? Bilirubin, UA Negative Negative  ? Urobilinogen, Ur 0.2 0.2 - 1.0 mg/dL  ? Nitrite, UA Negative Negative  ? Microscopic Examination See below:   ? ?   ?Assessment & Plan:  ? ?Problem List Items Addressed This Visit   ?None ?Visit Diagnoses   ? ? Urinary frequency    -  Primary  ? UA clear. Will increase  his flomax and recheck 1 month. Call with any concerns.   ? Relevant Orders  ? Urinalysis, Routine w reflex microscopic (Completed)  ? Acute cough      ? Strep negative. Await COVID. Treat with prednisone and recheck 1 month.   ? Relevant Medications  ? triamcinolone acetonide (  KENALOG-40) injection 40 mg (Completed)  ? Other Relevant Orders  ? Novel Coronavirus, NAA ([facility])  ? Rapid Strep screen([facility])  ? ?  ?  ? ?Follow up plan: ?Return in about 4 weeks (around 10/05/2021). ? ? ? ? ? ?

## 2021-09-08 ENCOUNTER — Inpatient Hospital Stay: Payer: Medicare Other

## 2021-09-08 ENCOUNTER — Inpatient Hospital Stay: Payer: Medicare Other | Attending: Oncology

## 2021-09-08 VITALS — BP 131/71 | HR 72

## 2021-09-08 DIAGNOSIS — D7581 Myelofibrosis: Secondary | ICD-10-CM

## 2021-09-08 DIAGNOSIS — Z79899 Other long term (current) drug therapy: Secondary | ICD-10-CM

## 2021-09-08 DIAGNOSIS — N183 Chronic kidney disease, stage 3 unspecified: Secondary | ICD-10-CM

## 2021-09-08 DIAGNOSIS — D473 Essential (hemorrhagic) thrombocythemia: Secondary | ICD-10-CM | POA: Insufficient documentation

## 2021-09-08 LAB — HEMOGLOBIN AND HEMATOCRIT, BLOOD
HCT: 31.9 % — ABNORMAL LOW (ref 39.0–52.0)
Hemoglobin: 9.8 g/dL — ABNORMAL LOW (ref 13.0–17.0)

## 2021-09-08 LAB — NOVEL CORONAVIRUS, NAA: SARS-CoV-2, NAA: NOT DETECTED

## 2021-09-08 MED ORDER — EPOETIN ALFA-EPBX 20000 UNIT/ML IJ SOLN: 50000.0000 [IU] | INTRAMUSCULAR | Status: DC

## 2021-09-08 MED ORDER — EPOETIN ALFA-EPBX 40000 UNIT/ML IJ SOLN
40000.0000 [IU] | Freq: Once | INTRAMUSCULAR | Status: AC
  Administered 2021-09-08: 40000 [IU] via SUBCUTANEOUS
  Filled 2021-09-08: qty 1

## 2021-09-08 MED ORDER — EPOETIN ALFA-EPBX 10000 UNIT/ML IJ SOLN
10000.0000 [IU] | Freq: Once | INTRAMUSCULAR | Status: AC
  Administered 2021-09-08: 10000 [IU] via SUBCUTANEOUS
  Filled 2021-09-08: qty 1

## 2021-09-10 LAB — URINALYSIS, ROUTINE W REFLEX MICROSCOPIC
Bilirubin, UA: NEGATIVE
Glucose, UA: NEGATIVE
Ketones, UA: NEGATIVE
Leukocytes,UA: NEGATIVE
Nitrite, UA: NEGATIVE
Specific Gravity, UA: >1.03 — ABNORMAL HIGH (ref 1.005–1.030)
Urobilinogen, Ur: 0.2 mg/dL (ref 0.2–1.0)
pH, UA: 5.5 (ref 5.0–7.5)

## 2021-09-10 LAB — RAPID STREP SCREEN (MED CTR MEBANE ONLY): Strep Gp A Ag, IA W/Reflex: NEGATIVE

## 2021-09-10 LAB — MICROSCOPIC EXAMINATION
Bacteria, UA: NONE SEEN
Epithelial Cells (non renal): NONE SEEN /hpf (ref 0–10)
WBC, UA: NONE SEEN /hpf (ref 0–5)

## 2021-09-10 LAB — CULTURE, GROUP A STREP: Strep A Culture: NEGATIVE

## 2021-09-15 ENCOUNTER — Ambulatory Visit (INDEPENDENT_AMBULATORY_CARE_PROVIDER_SITE_OTHER): Payer: Medicare Other | Admitting: Family Medicine

## 2021-09-15 ENCOUNTER — Inpatient Hospital Stay: Payer: Medicare Other

## 2021-09-15 ENCOUNTER — Encounter: Payer: Self-pay | Admitting: Family Medicine

## 2021-09-15 VITALS — BP 142/75 | HR 68

## 2021-09-15 VITALS — BP 118/78 | HR 67 | Temp 98.7°F | Ht 70.0 in | Wt 192.6 lb

## 2021-09-15 DIAGNOSIS — R052 Subacute cough: Secondary | ICD-10-CM

## 2021-09-15 DIAGNOSIS — D7581 Myelofibrosis: Secondary | ICD-10-CM

## 2021-09-15 DIAGNOSIS — Z79899 Other long term (current) drug therapy: Secondary | ICD-10-CM

## 2021-09-15 DIAGNOSIS — D473 Essential (hemorrhagic) thrombocythemia: Secondary | ICD-10-CM | POA: Diagnosis not present

## 2021-09-15 DIAGNOSIS — N183 Chronic kidney disease, stage 3 unspecified: Secondary | ICD-10-CM

## 2021-09-15 LAB — HEMOGLOBIN AND HEMATOCRIT, BLOOD
HCT: 31.1 % — ABNORMAL LOW (ref 39.0–52.0)
Hemoglobin: 9.7 g/dL — ABNORMAL LOW (ref 13.0–17.0)

## 2021-09-15 MED ORDER — EPOETIN ALFA-EPBX 20000 UNIT/ML IJ SOLN: 50000.0000 [IU] | INTRAMUSCULAR | Status: DC

## 2021-09-15 MED ORDER — EPOETIN ALFA-EPBX 10000 UNIT/ML IJ SOLN
10000.0000 [IU] | Freq: Once | INTRAMUSCULAR | Status: AC
  Administered 2021-09-15: 10000 [IU] via SUBCUTANEOUS
  Filled 2021-09-15: qty 1

## 2021-09-15 MED ORDER — EPOETIN ALFA-EPBX 40000 UNIT/ML IJ SOLN
40000.0000 [IU] | Freq: Once | INTRAMUSCULAR | Status: AC
  Administered 2021-09-15: 40000 [IU] via SUBCUTANEOUS
  Filled 2021-09-15: qty 1

## 2021-09-15 MED ORDER — PREDNISONE 10 MG PO TABS: ORAL_TABLET | ORAL | 0 refills | Status: DC

## 2021-09-15 MED ORDER — ALBUTEROL SULFATE HFA 108 (90 BASE) MCG/ACT IN AERS: 2.0000 | INHALATION_SPRAY | Freq: Four times a day (QID) | RESPIRATORY_TRACT | 0 refills | Status: DC | PRN

## 2021-09-15 NOTE — Progress Notes (Signed)
? ?BP 118/78   Pulse 67   Temp 98.7 ?F (37.1 ?C) (Oral)   Ht '5\' 10"'$  (1.778 m)   Wt 192 lb 9.6 oz (87.4 kg)   SpO2 98%   BMI 27.64 kg/m?   ? ?Subjective:  ? ? Patient ID: [name], male    DOB: 10-27-1940, 80 y.o.   MRN: [id number] ? ?HPI: ?[name] is a 81 y.o. male ? ?Chief Complaint  ?Patient presents with  ? Cough  ?  Has a severe cough for the past 2 weeks, and cant get rid of, he coughs all night with every breath.   ? ?COUGH ?Duration: 2 weeks ?Circumstances of initial development of cough: URI ?Cough severity: moderate ?Cough description: non-productive, hacking, barking, and dry ?Aggravating factors:  worse at night ?Alleviating factors: nothing ?Status:  stable ?Treatments attempted: cold/sinus and mucinex ?Wheezing: no ?Shortness of breath: no ?Chest pain: no ?Chest tightness:yes ?Nasal congestion: no ?Runny nose: no ?Postnasal drip: no ?Frequent throat clearing or swallowing: no ?Hemoptysis: no ?Fevers: no ?Night sweats: no ?Weight loss: no ?Heartburn: no ?Recent foreign travel: no ?Tuberculosis contacts: no ? ?Relevant past medical, surgical, family and social history reviewed and updated as indicated. Interim medical history since our last visit reviewed. ?Allergies and medications reviewed and updated. ? ?Review of Systems  ?Constitutional: Negative.   ?Respiratory:  Positive for cough. Negative for apnea, choking, chest tightness, shortness of breath, wheezing and stridor.   ?Cardiovascular: Negative.   ?Gastrointestinal: Negative.   ?Musculoskeletal: Negative.   ?Neurological: Negative.   ?Psychiatric/Behavioral: Negative.    ? ?Per HPI unless specifically indicated above ? ?   ?Objective:  ?  ?BP 118/78   Pulse 67   Temp 98.7 ?F (37.1 ?C) (Oral)   Ht '5\' 10"'$  (1.778 m)   Wt 192 lb 9.6 oz (87.4 kg)   SpO2 98%   BMI 27.64 kg/m?   ?Wt Readings from Last 3 Encounters:  ?09/15/21 192 lb 9.6 oz (87.4 kg)  ?09/07/21 190 lb 9.6 oz (86.5 kg)  ?07/22/21 190 lb 6.4 oz (86.4 kg)  ?  ?Physical  Exam ?Vitals and nursing note reviewed.  ?Constitutional:   ?   General: He is not in acute distress. ?   Appearance: Normal appearance. He is not ill-appearing, toxic-appearing or diaphoretic.  ?HENT:  ?   Head: Normocephalic and atraumatic.  ?   Right Ear: External ear normal.  ?   Left Ear: External ear normal.  ?   Nose: Nose normal.  ?   Mouth/Throat:  ?   Mouth: Mucous membranes are moist.  ?   Pharynx: Oropharynx is clear.  ?Eyes:  ?   General: No scleral icterus.    ?   Right eye: No discharge.     ?   Left eye: No discharge.  ?   Extraocular Movements: Extraocular movements intact.  ?   Conjunctiva/sclera: Conjunctivae normal.  ?   Pupils: Pupils are equal, round, and reactive to light.  ?Cardiovascular:  ?   Rate and Rhythm: Normal rate and regular rhythm.  ?   Pulses: Normal pulses.  ?   Heart sounds: Normal heart sounds. No murmur heard. ?  No friction rub. No gallop.  ?Pulmonary:  ?   Effort: Pulmonary effort is normal. No respiratory distress.  ?   Breath sounds: Normal breath sounds. No stridor. No wheezing, rhonchi or rales.  ?Chest:  ?   Chest wall: No tenderness.  ?Musculoskeletal:     ?  General: Normal range of motion.  ?   Cervical back: Normal range of motion and neck supple.  ?Skin: ?   General: Skin is warm and dry.  ?   Capillary Refill: Capillary refill takes less than 2 seconds.  ?   Coloration: Skin is not jaundiced or pale.  ?   Findings: No bruising, erythema, lesion or rash.  ?Neurological:  ?   General: No focal deficit present.  ?   Mental Status: He is alert and oriented to person, place, and time. Mental status is at baseline.  ?Psychiatric:     ?   Mood and Affect: Mood normal.     ?   Behavior: Behavior normal.     ?   Thought Content: Thought content normal.     ?   Judgment: Judgment normal.  ? ? ?Results for orders placed or performed in visit on 09/15/21  ?Hemoglobin and Hematocrit, Blood  ?Result Value Ref Range  ? Hemoglobin 9.7 (L) 13.0 - 17.0 g/dL  ? HCT 31.1 (L) 39.0 -  52.0 %  ? ?   ?Assessment & Plan:  ? ?Problem List Items Addressed This Visit   ?None ?Visit Diagnoses   ? ? Subacute cough    -  Primary  ? Lungs clear. Likely post-infectious. Will treat with prednisone and albuterol. Follow up as scheduled. Call with any concerns.   ? ?  ?  ? ?Follow up plan: ?Return if symptoms worsen or fail to improve. ? ? ? ? ? ?

## 2021-09-22 ENCOUNTER — Inpatient Hospital Stay: Payer: Medicare Other

## 2021-09-22 VITALS — BP 156/67 | HR 59

## 2021-09-22 DIAGNOSIS — D7581 Myelofibrosis: Secondary | ICD-10-CM

## 2021-09-22 DIAGNOSIS — M5033 Other cervical disc degeneration, cervicothoracic region: Secondary | ICD-10-CM | POA: Diagnosis not present

## 2021-09-22 DIAGNOSIS — N183 Chronic kidney disease, stage 3 unspecified: Secondary | ICD-10-CM

## 2021-09-22 DIAGNOSIS — M9903 Segmental and somatic dysfunction of lumbar region: Secondary | ICD-10-CM | POA: Diagnosis not present

## 2021-09-22 DIAGNOSIS — M9901 Segmental and somatic dysfunction of cervical region: Secondary | ICD-10-CM | POA: Diagnosis not present

## 2021-09-22 DIAGNOSIS — D473 Essential (hemorrhagic) thrombocythemia: Secondary | ICD-10-CM | POA: Diagnosis not present

## 2021-09-22 DIAGNOSIS — M5412 Radiculopathy, cervical region: Secondary | ICD-10-CM | POA: Diagnosis not present

## 2021-09-22 DIAGNOSIS — D649 Anemia, unspecified: Secondary | ICD-10-CM

## 2021-09-22 LAB — HEMOGLOBIN AND HEMATOCRIT, BLOOD
HCT: 32 % — ABNORMAL LOW (ref 39.0–52.0)
Hemoglobin: 9.9 g/dL — ABNORMAL LOW (ref 13.0–17.0)

## 2021-09-22 MED ORDER — EPOETIN ALFA-EPBX 40000 UNIT/ML IJ SOLN
40000.0000 [IU] | Freq: Once | INTRAMUSCULAR | Status: AC
  Administered 2021-09-22: 40000 [IU] via SUBCUTANEOUS
  Filled 2021-09-22: qty 1

## 2021-09-22 MED ORDER — EPOETIN ALFA-EPBX 10000 UNIT/ML IJ SOLN
10000.0000 [IU] | Freq: Once | INTRAMUSCULAR | Status: AC
  Administered 2021-09-22: 10000 [IU] via SUBCUTANEOUS
  Filled 2021-09-22: qty 1

## 2021-09-22 MED ORDER — EPOETIN ALFA-EPBX 20000 UNIT/ML IJ SOLN: 50000.0000 [IU] | INTRAMUSCULAR | Status: DC

## 2021-09-29 ENCOUNTER — Inpatient Hospital Stay: Payer: Medicare Other

## 2021-09-29 VITALS — BP 158/76 | HR 68

## 2021-09-29 DIAGNOSIS — D7581 Myelofibrosis: Secondary | ICD-10-CM | POA: Diagnosis not present

## 2021-09-29 DIAGNOSIS — N183 Chronic kidney disease, stage 3 unspecified: Secondary | ICD-10-CM

## 2021-09-29 DIAGNOSIS — D473 Essential (hemorrhagic) thrombocythemia: Secondary | ICD-10-CM | POA: Diagnosis not present

## 2021-09-29 DIAGNOSIS — Z79899 Other long term (current) drug therapy: Secondary | ICD-10-CM

## 2021-09-29 LAB — HEMOGLOBIN AND HEMATOCRIT, BLOOD
HCT: 32.8 % — ABNORMAL LOW (ref 39.0–52.0)
Hemoglobin: 9.9 g/dL — ABNORMAL LOW (ref 13.0–17.0)

## 2021-09-29 MED ORDER — EPOETIN ALFA-EPBX 40000 UNIT/ML IJ SOLN
40000.0000 [IU] | Freq: Once | INTRAMUSCULAR | Status: AC
  Administered 2021-09-29: 40000 [IU] via SUBCUTANEOUS
  Filled 2021-09-29: qty 1

## 2021-09-29 MED ORDER — EPOETIN ALFA-EPBX 20000 UNIT/ML IJ SOLN: 50000.0000 [IU] | INTRAMUSCULAR | Status: DC

## 2021-09-29 MED ORDER — EPOETIN ALFA-EPBX 10000 UNIT/ML IJ SOLN
10000.0000 [IU] | Freq: Once | INTRAMUSCULAR | Status: AC
  Administered 2021-09-29: 10000 [IU] via SUBCUTANEOUS
  Filled 2021-09-29: qty 1

## 2021-09-30 ENCOUNTER — Other Ambulatory Visit: Payer: Self-pay | Admitting: Family Medicine

## 2021-10-01 NOTE — Telephone Encounter (Signed)
Requested medication (s) are due for refill today - no ? ?Requested medication (s) are on the active medication list -yes ? ?Future visit scheduled -yes ? ?Last refill: 07/22/21 #180 1RF ? ?Notes to clinic: Request RF: Patient should not be out of medication- requested #/Sig is not what was sent at last visit. Attempted to call patient to verify dosing he is using- no answer and no way to leave call back message. ? ?Requested Prescriptions  ?Pending Prescriptions Disp Refills  ? gabapentin (NEURONTIN) 300 MG capsule [Pharmacy Med Name: GABAPENTIN 300 MG CAP] 270 capsule   ?  Sig: TAKE 1 CAPSULE BY MOUTH THREE TIMES DAILY  ?  ? Neurology: Anticonvulsants - gabapentin Failed - 09/30/2021 10:27 AM  ?  ?  Failed - Cr in normal range and within 360 days  ?  Creatinine  ?Date Value Ref Range Status  ?06/18/2014 1.09 0.60 - 1.30 mg/dL Final  ? ?Creatinine, Ser  ?Date Value Ref Range Status  ?07/22/2021 1.81 (H) 0.76 - 1.27 mg/dL Final  ?  ?  ?  ?  Passed - Completed PHQ-2 or PHQ-9 in the last 360 days  ?  ?  Passed - Valid encounter within last 12 months  ?  Recent Outpatient Visits   ? ?      ? 2 weeks ago Subacute cough  ? [place] P, DO  ? 3 weeks ago Urinary frequency  ? [place], [place] P, DO  ? 2 months ago Benign hypertensive renal disease  ? [place], [place] P, DO  ? 2 months ago Low back pain, unspecified back pain laterality, unspecified chronicity, unspecified whether sciatica present  ? [facility] Vigg, [place], MD  ? 4 months ago Splenomegaly  ? [place], [place] P, DO  ? ?  ?  ?Future Appointments   ? ?        ? In 1 week Wynetta Emery, Barb Merino, DO [place], [facility]  ? In 1 month Gollan, Kathlene November, MD [facility]  ? In 3 months Wynetta Emery, Barb Merino, DO [facility], [facility]  ? ?  ? ? ?  ?  ?  ? ? ? ?Requested Prescriptions  ?Pending Prescriptions Disp Refills  ?  gabapentin (NEURONTIN) 300 MG capsule [Pharmacy Med Name: GABAPENTIN 300 MG CAP] 270 capsule   ?  Sig: TAKE 1 CAPSULE BY MOUTH THREE TIMES DAILY  ?  ? Neurology: Anticonvulsants - gabapentin Failed - 09/30/2021 10:27 AM  ?  ?  Failed - Cr in normal range and within 360 days  ?  Creatinine  ?Date Value Ref Range Status  ?06/18/2014 1.09 0.60 - 1.30 mg/dL Final  ? ?Creatinine, Ser  ?Date Value Ref Range Status  ?07/22/2021 1.81 (H) 0.76 - 1.27 mg/dL Final  ?  ?  ?  ?  Passed - Completed PHQ-2 or PHQ-9 in the last 360 days  ?  ?  Passed - Valid encounter within last 12 months  ?  Recent Outpatient Visits   ? ?      ? 2 weeks ago Subacute cough  ? [place] P, DO  ? 3 weeks ago Urinary frequency  ? [place], [place] P, DO  ? 2 months ago Benign hypertensive renal disease  ? [place], [place] P, DO  ? 2 months ago Low back pain, unspecified back pain laterality, unspecified chronicity,  unspecified whether sciatica present  ? [facility] Vigg, Avanti, MD  ? 4 months ago Splenomegaly  ? [place], [place] P, DO  ? ?  ?  ?Future Appointments   ? ?        ? In 1 week Wynetta Emery, Barb Merino, DO [place], [facility]  ? In 1 month Gollan, Kathlene November, MD [facility]  ? In 3 months Wynetta Emery, Barb Merino, DO [facility], [facility]  ? ?  ? ? ?  ?  ?  ? ? ? ?

## 2021-10-05 ENCOUNTER — Other Ambulatory Visit: Payer: Self-pay | Admitting: Family Medicine

## 2021-10-05 MED ORDER — GABAPENTIN 300 MG PO CAPS: ORAL_CAPSULE | ORAL | 0 refills | Status: DC

## 2021-10-06 ENCOUNTER — Inpatient Hospital Stay: Payer: Medicare Other

## 2021-10-06 ENCOUNTER — Inpatient Hospital Stay: Payer: Medicare Other | Admitting: Oncology

## 2021-10-07 ENCOUNTER — Encounter: Payer: Self-pay | Admitting: Medical Oncology

## 2021-10-07 ENCOUNTER — Inpatient Hospital Stay (HOSPITAL_BASED_OUTPATIENT_CLINIC_OR_DEPARTMENT_OTHER): Payer: Medicare Other | Admitting: Medical Oncology

## 2021-10-07 ENCOUNTER — Inpatient Hospital Stay: Payer: Medicare Other

## 2021-10-07 ENCOUNTER — Other Ambulatory Visit: Payer: Self-pay

## 2021-10-07 ENCOUNTER — Inpatient Hospital Stay: Payer: Medicare Other | Attending: Oncology

## 2021-10-07 VITALS — BP 157/70 | HR 70 | Temp 97.1°F | Resp 18 | Wt 189.2 lb

## 2021-10-07 DIAGNOSIS — E538 Deficiency of other specified B group vitamins: Secondary | ICD-10-CM

## 2021-10-07 DIAGNOSIS — D631 Anemia in chronic kidney disease: Secondary | ICD-10-CM | POA: Insufficient documentation

## 2021-10-07 DIAGNOSIS — D649 Anemia, unspecified: Secondary | ICD-10-CM | POA: Diagnosis not present

## 2021-10-07 DIAGNOSIS — D474 Osteomyelofibrosis: Secondary | ICD-10-CM | POA: Diagnosis not present

## 2021-10-07 DIAGNOSIS — D473 Essential (hemorrhagic) thrombocythemia: Secondary | ICD-10-CM | POA: Diagnosis not present

## 2021-10-07 DIAGNOSIS — Z87891 Personal history of nicotine dependence: Secondary | ICD-10-CM | POA: Insufficient documentation

## 2021-10-07 DIAGNOSIS — D630 Anemia in neoplastic disease: Secondary | ICD-10-CM | POA: Diagnosis not present

## 2021-10-07 DIAGNOSIS — N183 Chronic kidney disease, stage 3 unspecified: Secondary | ICD-10-CM | POA: Diagnosis not present

## 2021-10-07 DIAGNOSIS — D6489 Other specified anemias: Secondary | ICD-10-CM

## 2021-10-07 DIAGNOSIS — M47812 Spondylosis without myelopathy or radiculopathy, cervical region: Secondary | ICD-10-CM | POA: Insufficient documentation

## 2021-10-07 DIAGNOSIS — D7581 Myelofibrosis: Secondary | ICD-10-CM | POA: Insufficient documentation

## 2021-10-07 DIAGNOSIS — Z79899 Other long term (current) drug therapy: Secondary | ICD-10-CM

## 2021-10-07 LAB — IRON AND TIBC
Iron: 54 ug/dL (ref 45–182)
Saturation Ratios: 23 % (ref 17.9–39.5)
TIBC: 239 ug/dL — ABNORMAL LOW (ref 250–450)
UIBC: 185 ug/dL

## 2021-10-07 LAB — COMPREHENSIVE METABOLIC PANEL
ALT: 19 U/L (ref 0–44)
AST: 23 U/L (ref 15–41)
Albumin: 3 g/dL — ABNORMAL LOW (ref 3.5–5.0)
Alkaline Phosphatase: 89 U/L (ref 38–126)
Anion gap: 5 (ref 5–15)
BUN: 25 mg/dL — ABNORMAL HIGH (ref 8–23)
CO2: 25 mmol/L (ref 22–32)
Calcium: 8.2 mg/dL — ABNORMAL LOW (ref 8.9–10.3)
Chloride: 109 mmol/L (ref 98–111)
Creatinine, Ser: 1.74 mg/dL — ABNORMAL HIGH (ref 0.61–1.24)
GFR, Estimated: 39 mL/min — ABNORMAL LOW (ref >60)
Glucose, Bld: 159 mg/dL — ABNORMAL HIGH (ref 70–99)
Potassium: 4.8 mmol/L (ref 3.5–5.1)
Sodium: 139 mmol/L (ref 135–145)
Total Bilirubin: 0.6 mg/dL (ref 0.3–1.2)
Total Protein: 5.5 g/dL — ABNORMAL LOW (ref 6.5–8.1)

## 2021-10-07 LAB — CBC WITH DIFFERENTIAL/PLATELET
Abs Immature Granulocytes: 0.36 10*3/uL — ABNORMAL HIGH (ref 0.00–0.07)
Basophils Absolute: 0 10*3/uL (ref 0.0–0.1)
Basophils Relative: 1 %
Eosinophils Absolute: 0 10*3/uL (ref 0.0–0.5)
Eosinophils Relative: 0 %
HCT: 32.7 % — ABNORMAL LOW (ref 39.0–52.0)
Hemoglobin: 10 g/dL — ABNORMAL LOW (ref 13.0–17.0)
Immature Granulocytes: 13 %
Lymphocytes Relative: 23 %
Lymphs Abs: 0.7 10*3/uL (ref 0.7–4.0)
MCH: 28.2 pg (ref 26.0–34.0)
MCHC: 30.6 g/dL (ref 30.0–36.0)
MCV: 92.1 fL (ref 80.0–100.0)
Monocytes Absolute: 0.5 10*3/uL (ref 0.1–1.0)
Monocytes Relative: 16 %
Neutro Abs: 1.4 10*3/uL — ABNORMAL LOW (ref 1.7–7.7)
Neutrophils Relative %: 47 %
Platelets: 222 10*3/uL (ref 150–400)
RBC: 3.55 MIL/uL — ABNORMAL LOW (ref 4.22–5.81)
RDW: 19 % — ABNORMAL HIGH (ref 11.5–15.5)
Smear Review: ADEQUATE
WBC: 2.9 10*3/uL — ABNORMAL LOW (ref 4.0–10.5)
nRBC: 1.4 % — ABNORMAL HIGH (ref 0.0–0.2)

## 2021-10-07 LAB — VITAMIN B12: Vitamin B-12: 942 pg/mL — ABNORMAL HIGH (ref 180–914)

## 2021-10-07 LAB — FERRITIN: Ferritin: 135 ng/mL (ref 24–336)

## 2021-10-07 LAB — LACTATE DEHYDROGENASE: LDH: 413 U/L — ABNORMAL HIGH (ref 98–192)

## 2021-10-07 NOTE — Progress Notes (Signed)
Pt states he has been having lots of leg cramps; keeping him awake at night seems to be worse at night; also having trouble sleeping up multiple times a night. Will like to know if he can have something to sleep at night.  ? ? ?

## 2021-10-07 NOTE — Progress Notes (Signed)
? ? ? ?Hematology/Oncology Consult note ?[place]  ?[phone] [phone] ? ?Patient Care Team: ?[name], DO as PCP - General (Family Medicine) ?Pieter Partridge, MD as Referring Physician (Hematology) ?Hayden Pedro, MD as Consulting Physician (Ophthalmology) ?Garvin Fila, MD as Consulting Physician (Neurology) ?Festus Aloe, MD as Consulting Physician (Urology) ?Minna Merritts, MD as Consulting Physician (Cardiology) ?Stoioff, Ronda Fairly, MD (Urology) ?Sindy Guadeloupe, MD as Consulting Physician (Hematology and Oncology)  ? ?Name of the patient: [name]  ?[id number]  ?10/06/1940  ? ?Date of visit: 10/07/21 ? 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 ?Chief complaint/ Reason for visit-routine follow-up of anemia on Retacrit ? ?Heme/Onc history: Patient is a 81 year old male diagnosed with primary myelofibrosis back in 2016.  At that time he was found to have a mild splenomegaly of 15.8 cm.DIPPS score is 72 (age 79- 1, hemoglobin less than 10- 2) and score of 4 if 1% circulating blasts included from 07/17/2014. ?  ?Bone marrow on 06/11/2014 was most consistent with primary myelofibrosis.  Bone marrow biopsy showed 1% abnormal cells: CD45+, CD5+, CD10, CD11c+/-, CD19+, CD2-+, (dim), CD22+ (dim, CD23+, CD38-/+, FMC7-, HLA-DR+, sig lambda+(dim).  Blasts were not increased 1.2%; hypercellular for age: 81%; JAK2 V617F mutation was negative.  CALR mutation positive.  Flow cytometry included about 1% CLL/SLL phenotype cells (MB5+) of uncertain significance and some infiltrate into the marrow with increased atypical megakaryocytes.  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  MDS FISH panel was negative. ?  ?Patient also follows up with [facility] benign hematology Dr. Sabino Dick for his anemia.  Patient's hemoglobin was drifting down to the eights and was started on EPO in October 2022. ?  ? ?Interval  history- Patient reports that he has had some leg cramps at night and subsequently some trouble sleeping. He has tried Advil PM which has helped. He asks if there is anything rx he can take. No SOB, fatigue, bleeding episodes.  ? ?ECOG PS- 1 ?Pain scale- 0 ? ? ?Review of systems- Review of Systems  ?Constitutional:  Negative for chills, fever, malaise/fatigue and weight loss.  ?HENT:  Negative for congestion, ear discharge and nosebleeds.   ?Eyes:  Negative for blurred vision.  ?Respiratory:  Negative for cough, hemoptysis, sputum production, shortness of breath and wheezing.   ?Cardiovascular:  Negative for chest pain, palpitations, orthopnea and claudication.  ?Gastrointestinal:  Negative for abdominal pain, blood in stool, constipation, diarrhea, heartburn, melena, nausea and vomiting.  ?Genitourinary:  Negative for dysuria, flank pain, frequency, hematuria and urgency.  ?Musculoskeletal:  Negative for back pain, joint pain and myalgias.  ?Skin:  Negative for rash.  ?Neurological:  Negative for dizziness, tingling, focal weakness, seizures, weakness and headaches.  ?Endo/Heme/Allergies:  Does not bruise/bleed easily.  ?Psychiatric/Behavioral:  Negative for depression and suicidal ideas. The patient does not have insomnia.    ? ? ? ?Allergies  ?Allergen Reactions  ? Meloxicam Nausea And Vomiting  ? ? ? ?Past Medical History:  ?Diagnosis Date  ? Arthritis   ? Benign hypertensive renal disease   ? GERD (gastroesophageal reflux disease)   ? Heartburn   ? History of retinal detachment   ? Hyperlipidemia   ? Hypertension   ? Melanoma ([place])   ? 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 approximately 10-15 years ago  ? Myelofibrosis ([place])   ? Prostate hypertrophy   ? Stroke [facility]) June 2009  ? R brain subcortical infarct  ? ? ? ?  Past Surgical History:  ?Procedure Laterality Date  ? ASPIRATION / INJECTION RENAL CYST  Feb 2017  ? BACK SURGERY    ? approx 20- 25 years ago  ? GAS INSERTION  08/11/2011  ? Procedure: INSERTION OF  GAS;  Surgeon: Hayden Pedro, MD;  Location: [place];  Service: Ophthalmology;  Laterality: Right;  [id number]  ? SCLERAL BUCKLE  08/11/2011  ? Procedure: SCLERAL BUCKLE;  Surgeon: Hayden Pedro, MD;  Location: [place];  Service: Ophthalmology;  Laterality: Right;  ? VARICOSE VEIN SURGERY    ? ? ?Social History  ? ?Socioeconomic History  ? Marital status: Married  ?  Spouse name: Parke Simmers   ? Number of children: 2  ? Years of education: 12+  ? Highest education level: Some college, no degree  ?Occupational History  ? Occupation: Museum/gallery curator  ?  Employer: OTHER  ?  Comment: community  ? Occupation: SELF EMPLOYED  ?  Employer: SELF EMPLOYED  ?Tobacco Use  ? Smoking status: Former  ?  Packs/day: 0.25  ?  Types: Cigarettes  ?  Quit date: 06/06/2010  ?  Years since quitting: 11.3  ? Smokeless tobacco: Never  ?Vaping Use  ? Vaping Use: Never used  ?Substance and Sexual Activity  ? Alcohol use: No  ?  Alcohol/week: 0.0 standard drinks  ? Drug use: No  ? Sexual activity: Not Currently  ?Other Topics Concern  ? Not on file  ?Social History Narrative  ? Pt lives at home with his family.  ? Caffeine Use- 2 cups daily  ? Patient has 2 children.   ? Patient has some college.   ? Patient is right handed.   ?   ?   ? Works full time  ? ?Social Determinants of Health  ? ?Financial Resource Strain: Low Risk   ? Difficulty of Paying Living Expenses: Not hard at all  ?Food Insecurity: No Food Insecurity  ? Worried About Charity fundraiser in the Last Year: Never true  ? Ran Out of Food in the Last Year: Never true  ?Transportation Needs: No Transportation Needs  ? Lack of Transportation (Medical): No  ? Lack of Transportation (Non-Medical): No  ?Physical Activity: Inactive  ? Days of Exercise per Week: 0 days  ? Minutes of Exercise per Session: 0 min  ?Stress: No Stress Concern Present  ? Feeling of Stress : Not at all  ?Social Connections: Socially Integrated  ? Frequency of Communication with Friends and Family: Three times a week  ? Frequency of  Social Gatherings with Friends and Family: More than three times a week  ? Attends Religious Services: More than 4 times per year  ? Active Member of Clubs or Organizations: Yes  ? Attends Archivist Meetings: More than 4 times per year  ? Marital Status: Married  ?Intimate Partner Violence: Not At Risk  ? Fear of Current or Ex-Partner: No  ? Emotionally Abused: No  ? Physically Abused: No  ? Sexually Abused: No  ? ? ?Family History  ?Problem Relation Age of Onset  ? Heart disease Father   ? Diabetes Son   ? Kidney disease Neg Hx   ? Prostate cancer Neg Hx   ? ? ? ?Current Outpatient Medications:  ?  amLODipine (NORVASC) 10 MG tablet, Take 1 tablet (10 mg total) by mouth daily. In the evening, Disp: 90 tablet, Rfl: 3 ?  calcium carbonate (OSCAL) 1500 (600 Ca) MG TABS tablet, Take by mouth., Disp: ,  Rfl:  ?  Cholecalciferol (D3-1000 PO), Take by mouth daily., Disp: , Rfl:  ?  clopidogrel (PLAVIX) 75 MG tablet, TAKE ONE TABLET BY MOUTH EVERY DAY, Disp: 90 tablet, Rfl: 1 ?  DULoxetine (CYMBALTA) 20 MG capsule, TAKE 1 CAPSULE BY MOUTH EVERY DAY, Disp: 90 capsule, Rfl: 1 ?  gabapentin (NEURONTIN) 300 MG capsule, Take 1 tab in the AM and 2 tabs at bedtime., Disp: 270 capsule, Rfl: 0 ?  Ginsengs-Saw Palmetto (MULTI GINSENG & SAW PALMETTO) 500 MG CAPS, Take 500 mg by mouth daily., Disp: , Rfl:  ?  losartan (COZAAR) 100 MG tablet, Take 1 tablet (100 mg total) by mouth in the morning., Disp: 90 tablet, Rfl: 3 ?  meclizine (ANTIVERT) 25 MG tablet, TAKE 1 TABLET BY MOUTH 3 TIMES DAILY AS NEEDED FOR DIZZINESS, Disp: 90 tablet, Rfl: 6 ?  Multiple Vitamin (MULITIVITAMIN WITH MINERALS) TABS, Take 1 tablet by mouth daily., Disp: , Rfl:  ?  Naproxen Sodium 220 MG CAPS, Take by mouth as needed., Disp: , Rfl:  ?  omeprazole (PRILOSEC) 40 MG capsule, Take 1 capsule (40 mg total) by mouth daily., Disp: 90 capsule, Rfl: 0 ?  simvastatin (ZOCOR) 40 MG tablet, Take 1 tablet (40 mg total) by mouth daily., Disp: 90 tablet, Rfl: 1 ?   tadalafil (CIALIS) 20 MG tablet, Take 0.5-1 tablets (10-20 mg total) by mouth every other day as needed for erectile dysfunction., Disp: 5 tablet, Rfl: 11 ?  tamsulosin (FLOMAX) 0.4 MG CAPS capsule, Take 2

## 2021-10-12 ENCOUNTER — Ambulatory Visit (INDEPENDENT_AMBULATORY_CARE_PROVIDER_SITE_OTHER): Payer: Medicare Other | Admitting: Family Medicine

## 2021-10-12 ENCOUNTER — Ambulatory Visit
Admission: RE | Admit: 2021-10-12 | Discharge: 2021-10-12 | Disposition: A | Discharge status: Home / Self Care | Payer: Medicare Other | Source: Ambulatory Visit | Attending: Family Medicine | Admitting: Family Medicine

## 2021-10-12 ENCOUNTER — Encounter: Payer: Self-pay | Admitting: Family Medicine

## 2021-10-12 VITALS — Temp 98.2°F | Wt 186.6 lb

## 2021-10-12 DIAGNOSIS — I951 Orthostatic hypotension: Secondary | ICD-10-CM

## 2021-10-12 DIAGNOSIS — R293 Abnormal posture: Secondary | ICD-10-CM | POA: Diagnosis not present

## 2021-10-12 DIAGNOSIS — R42 Dizziness and giddiness: Secondary | ICD-10-CM | POA: Diagnosis not present

## 2021-10-12 DIAGNOSIS — H532 Diplopia: Secondary | ICD-10-CM | POA: Insufficient documentation

## 2021-10-12 DIAGNOSIS — D649 Anemia, unspecified: Secondary | ICD-10-CM | POA: Diagnosis not present

## 2021-10-12 DIAGNOSIS — R350 Frequency of micturition: Secondary | ICD-10-CM

## 2021-10-12 DIAGNOSIS — I129 Hypertensive chronic kidney disease with stage 1 through stage 4 chronic kidney disease, or unspecified chronic kidney disease: Secondary | ICD-10-CM

## 2021-10-12 LAB — MICROSCOPIC EXAMINATION
Bacteria, UA: NONE SEEN
WBC, UA: NONE SEEN /hpf (ref 0–5)

## 2021-10-12 LAB — URINALYSIS, ROUTINE W REFLEX MICROSCOPIC
Bilirubin, UA: NEGATIVE
Glucose, UA: NEGATIVE
Ketones, UA: NEGATIVE
Leukocytes,UA: NEGATIVE
Nitrite, UA: NEGATIVE
Specific Gravity, UA: >1.03 — ABNORMAL HIGH (ref 1.005–1.030)
Urobilinogen, Ur: 0.2 mg/dL (ref 0.2–1.0)
pH, UA: 5.5 (ref 5.0–7.5)

## 2021-10-12 LAB — CBC WITH DIFFERENTIAL/PLATELET
Hematocrit: 32.7 % — ABNORMAL LOW (ref 37.5–51.0)
Hemoglobin: 10.3 g/dL — ABNORMAL LOW (ref 13.0–17.7)
Lymphocytes Absolute: 0.6 10*3/uL — ABNORMAL LOW (ref 0.7–3.1)
Lymphs: 19 %
MCH: 28 pg (ref 26.6–33.0)
MCHC: 31.5 g/dL (ref 31.5–35.7)
MCV: 89 fL (ref 79–97)
MID (Absolute): 0.4 10*3/uL (ref 0.1–1.6)
MID: 11 %
Neutrophils Absolute: 2.3 10*3/uL (ref 1.4–7.0)
Neutrophils: 70 %
Platelets: 321 10*3/uL (ref 150–450)
RBC: 3.68 x10E6/uL — ABNORMAL LOW (ref 4.14–5.80)
RDW: 20 % — ABNORMAL HIGH (ref 11.6–15.4)
WBC: 3.3 10*3/uL — ABNORMAL LOW (ref 3.4–10.8)

## 2021-10-12 MED ORDER — DUTASTERIDE 0.5 MG PO CAPS: 0.5000 mg | ORAL_CAPSULE | Freq: Every day | ORAL | 1 refills | Status: DC

## 2021-10-12 NOTE — Assessment & Plan Note (Signed)
Not orthostatic today. Checking CT head to r/o stroke.  ?

## 2021-10-12 NOTE — Assessment & Plan Note (Signed)
Doing well today but was high over the weekend. Will check CT head. Await results.  ?

## 2021-10-12 NOTE — Assessment & Plan Note (Signed)
Chronic, but worsening. Given sudden onset with elevated BP will get him CT head to r/o stroke. Await results. If not stroke, likely multifactorial and due to working in the heat. Checking labs today. Continue to monitor.  ?

## 2021-10-12 NOTE — Progress Notes (Signed)
? ?Temp 98.2 ?F (36.8 ?C)   Wt 186 lb 9.6 oz (84.6 kg)   SpO2 98%   BMI 26.77 kg/m?   ? ?Subjective:  ? ? Patient ID: [name], male    DOB: [birth year], 81 y.o.   MRN: [id number] ? ?HPI: ?[name] is a 81 y.o. male ? ?Chief Complaint  ?Patient presents with  ? Acute Cough  ? Dizziness  ?  Patient states he has been feeling dizzy and unsteady on his feet.   ? Urinary Frequency  ?  Patient states he has been getting up to use the bathroom at least twice during the night to use the bathroom.   ? ?DIZZINESS ?Duration: months, worse in the past week associated with elevated blood pressure ?Description of symptoms: off kilter ?Duration of episode: coming and going for 3-4 days ?Dizziness frequency: recurrent ?Triggered by rolling over in bed: no ?Triggered by bending over: yes ?Aggravated by head movement: yes ?Aggravated by exertion, coughing, loud noises: no ?Recent head injury: no ?Recent or current viral symptoms: no ?History of vasovagal episodes: no ?Nausea: yes ?Vomiting: no ?Tinnitus: yes ?Hearing loss: yes ?Aural fullness: yes ?Headache: no ?Photophobia/phonophobia: no ?Unsteady gait: yes ?Postural instability: yes ?Diplopia, dysarthria, dysphagia or weakness: yes ?Related to exertion: yes ?Pallor: yes ?Diaphoresis: no ?Dyspnea: no ?Chest pain: no ? ?HYPERTENSION- Sunday BP jumped up to 180/85, was working, didn't do anything  ?Hypertension status: controlled  ?Satisfied with current treatment? yes ?Duration of hypertension: chronic ?BP monitoring frequency:  occasionally ?BP medication side effects:  no ?Medication compliance: excellent compliance ?Aspirin: no ?Recurrent headaches: no ?Visual changes: yes ?Palpitations: no ?Dyspnea: no ?Chest pain: no ?Lower extremity edema: no ?Dizzy/lightheaded: yes ? ?URINARY SYMPTOMS ?Duration: about a month ?Dysuria: no ?Urinary frequency: yes ?Urgency: yes ?Small volume voids: no ?Symptom severity: no ?Urinary incontinence: no ?Foul odor: no ?Hematuria:  no ?Abdominal pain: no ?Back pain: no ?Suprapubic pain/pressure: no ?Flank pain: no ?Fever:  no ?Vomiting: no ?Relief with cranberry juice: no ?Relief with pyridium: no ?Status: worse ?Previous urinary tract infection: yes ?Recurrent urinary tract infection: no ?History of sexually transmitted disease: no ?Penile discharge: no ?Treatments attempted: increasing fluids  ? ?Relevant past medical, surgical, family and social history reviewed and updated as indicated. Interim medical history since our last visit reviewed. ?Allergies and medications reviewed and updated. ? ?Review of Systems  ?Constitutional:  Positive for fatigue. Negative for activity change, appetite change, chills, diaphoresis, fever and unexpected weight change.  ?HENT: Negative.    ?Eyes: Negative.   ?Respiratory: Negative.    ?Cardiovascular: Negative.   ?Gastrointestinal: Negative.   ?Endocrine: Negative.   ?Genitourinary:  Positive for frequency and urgency. Negative for decreased urine volume, difficulty urinating, dysuria, enuresis, flank pain, genital sores, hematuria, penile discharge, penile pain, penile swelling, scrotal swelling and testicular pain.  ?Musculoskeletal: Negative.   ?Skin:  Positive for pallor. Negative for color change, rash and wound.  ?Neurological:  Positive for dizziness and light-headedness. Negative for tremors, seizures, syncope, facial asymmetry, speech difficulty, weakness, numbness and headaches.  ?Hematological: Negative.   ?Psychiatric/Behavioral: Negative.    ? ?Per HPI unless specifically indicated above ? ?   ?Objective:  ?  ?Temp 98.2 ?F (36.8 ?C)   Wt 186 lb 9.6 oz (84.6 kg)   SpO2 98%   BMI 26.77 kg/m?   ?Wt Readings from Last 3 Encounters:  ?10/12/21 186 lb 9.6 oz (84.6 kg)  ?10/07/21 189 lb 3.2 oz (85.8 kg)  ?09/15/21 192 lb 9.6 oz (  87.4 kg)  ?  ?Physical Exam ?Vitals and nursing note reviewed.  ?Constitutional:   ?   General: He is not in acute distress. ?   Appearance: Normal appearance. He is normal  weight. He is not ill-appearing, toxic-appearing or diaphoretic.  ?HENT:  ?   Head: Normocephalic and atraumatic.  ?   Right Ear: Tympanic membrane, ear canal and external ear normal.  ?   Left Ear: Tympanic membrane, ear canal and external ear normal.  ?   Nose: Nose normal. No congestion or rhinorrhea.  ?   Mouth/Throat:  ?   Mouth: Mucous membranes are moist.  ?   Pharynx: Oropharynx is clear. No oropharyngeal exudate or posterior oropharyngeal erythema.  ?Eyes:  ?   General: No scleral icterus.    ?   Right eye: No discharge.     ?   Left eye: No discharge.  ?   Extraocular Movements: Extraocular movements intact.  ?   Right eye: Normal extraocular motion and no nystagmus.  ?   Left eye: Normal extraocular motion and no nystagmus.  ?   Conjunctiva/sclera: Conjunctivae normal.  ?   Pupils: Pupils are equal, round, and reactive to light.  ?Cardiovascular:  ?   Rate and Rhythm: Normal rate and regular rhythm.  ?   Pulses: Normal pulses.  ?   Heart sounds: Normal heart sounds. No murmur heard. ?  No friction rub. No gallop.  ?Pulmonary:  ?   Effort: Pulmonary effort is normal. No respiratory distress.  ?   Breath sounds: Normal breath sounds. No stridor. No wheezing, rhonchi or rales.  ?Chest:  ?   Chest wall: No tenderness.  ?Musculoskeletal:     ?   General: Normal range of motion.  ?   Cervical back: Normal range of motion and neck supple.  ?Skin: ?   General: Skin is warm and dry.  ?   Capillary Refill: Capillary refill takes less than 2 seconds.  ?   Coloration: Skin is not jaundiced or pale.  ?   Findings: No bruising, erythema, lesion or rash.  ?Neurological:  ?   General: No focal deficit present.  ?   Mental Status: He is alert and oriented to person, place, and time. Mental status is at baseline.  ?   Cranial Nerves: No cranial nerve deficit.  ?   Sensory: No sensory deficit.  ?   Motor: No weakness.  ?   Coordination: Coordination abnormal.  ?   Gait: Gait abnormal.  ?   Deep Tendon Reflexes: Reflexes  normal.  ?Psychiatric:     ?   Mood and Affect: Mood normal.     ?   Behavior: Behavior normal.     ?   Thought Content: Thought content normal.     ?   Judgment: Judgment normal.  ? ? ?Results for orders placed or performed in visit on 10/12/21  ?Microscopic Examination  ? BLD  ?Result Value Ref Range  ? WBC, UA None seen 0 - 5 /hpf  ? RBC 0-2 0 - 2 /hpf  ? Epithelial Cells (non renal) 0-10 0 - 10 /hpf  ? Bacteria, UA None seen None seen/Few  ?CBC With Differential/Platelet  ?Result Value Ref Range  ? WBC 3.3 (L) 3.4 - 10.8 x10E3/uL  ? RBC 3.68 (L) 4.14 - 5.80 x10E6/uL  ? Hemoglobin 10.3 (L) 13.0 - 17.7 g/dL  ? Hematocrit 32.7 (L) 37.5 - 51.0 %  ? MCV 89 79 - 97 fL  ?  MCH 28.0 26.6 - 33.0 pg  ? MCHC 31.5 31.5 - 35.7 g/dL  ? RDW 20.0 (H) 11.6 - 15.4 %  ? Platelets 321 150 - 450 x10E3/uL  ? Neutrophils 70 Not Estab. %  ? Lymphs 19 Not Estab. %  ? MID 11 Not Estab. %  ? Neutrophils Absolute 2.3 1.4 - 7.0 x10E3/uL  ? Lymphocytes Absolute 0.6 (L) 0.7 - 3.1 x10E3/uL  ? MID (Absolute) 0.4 0.1 - 1.6 X10E3/uL  ?Urinalysis, Routine w reflex microscopic  ?Result Value Ref Range  ? Specific Gravity, UA >1.030 (H) 1.005 - 1.030  ? pH, UA 5.5 5.0 - 7.5  ? Color, UA Yellow Yellow  ? Appearance Ur Hazy (A) Clear  ? Leukocytes,UA Negative Negative  ? Protein,UA 3+ (A) Negative/Trace  ? Glucose, UA Negative Negative  ? Ketones, UA Negative Negative  ? RBC, UA Trace (A) Negative  ? Bilirubin, UA Negative Negative  ? Urobilinogen, Ur 0.2 0.2 - 1.0 mg/dL  ? Nitrite, UA Negative Negative  ? Microscopic Examination See below:   ? ?   ?Assessment & Plan:  ? ?Problem List Items Addressed This Visit   ? ?  ? Cardiovascular and Mediastinum  ? Orthostatic hypotension  ?  Not orthostatic today. Checking CT head to r/o stroke.  ? ?  ?  ?  ? Genitourinary  ? Benign hypertensive renal disease  ?  Doing well today but was high over the weekend. Will check CT head. Await results.  ? ?  ?  ?  ? Other  ? Dizziness - Primary  ?  Chronic, but  worsening. Given sudden onset with elevated BP will get him CT head to r/o stroke. Await results. If not stroke, likely multifactorial and due to working in the heat. Checking labs today. Continue to monitor.  ? ?  ?  ? Rele

## 2021-10-12 NOTE — Assessment & Plan Note (Signed)
Better today than on last check with hematology. Due to see them tomorrow.  ?

## 2021-10-13 ENCOUNTER — Encounter: Payer: Self-pay | Admitting: Nurse Practitioner

## 2021-10-13 ENCOUNTER — Inpatient Hospital Stay (HOSPITAL_BASED_OUTPATIENT_CLINIC_OR_DEPARTMENT_OTHER): Payer: Medicare Other | Admitting: Nurse Practitioner

## 2021-10-13 ENCOUNTER — Inpatient Hospital Stay: Payer: Medicare Other

## 2021-10-13 ENCOUNTER — Other Ambulatory Visit: Payer: Self-pay | Admitting: Family Medicine

## 2021-10-13 VITALS — BP 142/75 | HR 77 | Resp 18 | Wt 189.0 lb

## 2021-10-13 DIAGNOSIS — M5412 Radiculopathy, cervical region: Secondary | ICD-10-CM | POA: Diagnosis not present

## 2021-10-13 DIAGNOSIS — M5033 Other cervical disc degeneration, cervicothoracic region: Secondary | ICD-10-CM | POA: Diagnosis not present

## 2021-10-13 DIAGNOSIS — D7581 Myelofibrosis: Secondary | ICD-10-CM | POA: Diagnosis not present

## 2021-10-13 DIAGNOSIS — Z79899 Other long term (current) drug therapy: Secondary | ICD-10-CM

## 2021-10-13 DIAGNOSIS — D649 Anemia, unspecified: Secondary | ICD-10-CM

## 2021-10-13 DIAGNOSIS — D630 Anemia in neoplastic disease: Secondary | ICD-10-CM

## 2021-10-13 DIAGNOSIS — N183 Chronic kidney disease, stage 3 unspecified: Secondary | ICD-10-CM | POA: Diagnosis not present

## 2021-10-13 DIAGNOSIS — M9901 Segmental and somatic dysfunction of cervical region: Secondary | ICD-10-CM | POA: Diagnosis not present

## 2021-10-13 DIAGNOSIS — E875 Hyperkalemia: Secondary | ICD-10-CM

## 2021-10-13 DIAGNOSIS — D473 Essential (hemorrhagic) thrombocythemia: Secondary | ICD-10-CM | POA: Diagnosis not present

## 2021-10-13 DIAGNOSIS — M9903 Segmental and somatic dysfunction of lumbar region: Secondary | ICD-10-CM | POA: Diagnosis not present

## 2021-10-13 DIAGNOSIS — D474 Osteomyelofibrosis: Secondary | ICD-10-CM | POA: Diagnosis not present

## 2021-10-13 DIAGNOSIS — D631 Anemia in chronic kidney disease: Secondary | ICD-10-CM | POA: Diagnosis not present

## 2021-10-13 LAB — CBC WITH DIFFERENTIAL/PLATELET
Abs Immature Granulocytes: 0.25 10*3/uL — ABNORMAL HIGH (ref 0.00–0.07)
Basophils Absolute: 0 10*3/uL (ref 0.0–0.1)
Basophils Relative: 1 %
Eosinophils Absolute: 0 10*3/uL (ref 0.0–0.5)
Eosinophils Relative: 0 %
HCT: 32.5 % — ABNORMAL LOW (ref 39.0–52.0)
Hemoglobin: 10.3 g/dL — ABNORMAL LOW (ref 13.0–17.0)
Immature Granulocytes: 10 %
Lymphocytes Relative: 28 %
Lymphs Abs: 0.7 10*3/uL (ref 0.7–4.0)
MCH: 28.6 pg (ref 26.0–34.0)
MCHC: 31.7 g/dL (ref 30.0–36.0)
MCV: 90.3 fL (ref 80.0–100.0)
Monocytes Absolute: 0.4 10*3/uL (ref 0.1–1.0)
Monocytes Relative: 16 %
Neutro Abs: 1.2 10*3/uL — ABNORMAL LOW (ref 1.7–7.7)
Neutrophils Relative %: 45 %
Platelets: 281 10*3/uL (ref 150–400)
RBC: 3.6 MIL/uL — ABNORMAL LOW (ref 4.22–5.81)
RDW: 19 % — ABNORMAL HIGH (ref 11.5–15.5)
Smear Review: NORMAL
WBC: 2.6 10*3/uL — ABNORMAL LOW (ref 4.0–10.5)
nRBC: 0.8 % — ABNORMAL HIGH (ref 0.0–0.2)

## 2021-10-13 LAB — BASIC METABOLIC PANEL
Anion gap: 6 (ref 5–15)
BUN/Creatinine Ratio: 20 (ref 10–24)
BUN: 32 mg/dL — ABNORMAL HIGH (ref 8–27)
BUN: 35 mg/dL — ABNORMAL HIGH (ref 8–23)
CO2: 19 mmol/L — ABNORMAL LOW (ref 20–29)
CO2: 21 mmol/L — ABNORMAL LOW (ref 22–32)
Calcium: 8.2 mg/dL — ABNORMAL LOW (ref 8.6–10.2)
Calcium: 7.9 mg/dL — ABNORMAL LOW (ref 8.9–10.3)
Chloride: 111 mmol/L — ABNORMAL HIGH (ref 96–106)
Chloride: 107 mmol/L (ref 98–111)
Creatinine, Ser: 1.63 mg/dL — ABNORMAL HIGH (ref 0.76–1.27)
Creatinine, Ser: 1.7 mg/dL — ABNORMAL HIGH (ref 0.61–1.24)
GFR, Estimated: 40 mL/min — ABNORMAL LOW (ref >60)
Glucose, Bld: 121 mg/dL — ABNORMAL HIGH (ref 70–99)
Glucose: 89 mg/dL (ref 70–99)
Potassium: 5.5 mmol/L — ABNORMAL HIGH (ref 3.5–5.2)
Potassium: 4.6 mmol/L (ref 3.5–5.1)
Sodium: 143 mmol/L (ref 134–144)
Sodium: 134 mmol/L — ABNORMAL LOW (ref 135–145)
eGFR: 42 mL/min/{1.73_m2} — ABNORMAL LOW (ref >59)

## 2021-10-13 NOTE — Progress Notes (Signed)
? ?Hematology/Oncology Consult Note ?[place]  ?[phone] [phone] ? ?Patient Care Team: ?[name], DO as PCP - General (Family Medicine) ?Pieter Partridge, MD as Referring Physician (Hematology) ?Hayden Pedro, MD as Consulting Physician (Ophthalmology) ?Garvin Fila, MD as Consulting Physician (Neurology) ?Festus Aloe, MD as Consulting Physician (Urology) ?Minna Merritts, MD as Consulting Physician (Cardiology) ?Stoioff, Ronda Fairly, MD (Urology) ?Sindy Guadeloupe, MD as Consulting Physician (Hematology and Oncology)  ? ?Name of the patient: [name]  ?[id number]  ?Oct 15, 1940  ? ?Date of visit: 10/13/21 ? 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 ?Chief complaint/ Reason for visit-routine follow-up of anemia on Retacrit ? ?Heme/Onc history: Patient is a 81 year old male diagnosed with primary myelofibrosis back in 2016.  At that time he was found to have a mild splenomegaly of 15.8 cm.DIPPS score is 43 (age 37- 1, hemoglobin less than 10- 2) and score of 4 if 1% circulating blasts included from 07/17/2014. ?  ?Bone marrow on 06/11/2014 was most consistent with primary myelofibrosis.  Bone marrow biopsy showed 1% abnormal cells: CD45+, CD5+, CD10, CD11c+/-, CD19+, CD2-+, (dim), CD22+ (dim, CD23+, CD38-/+, FMC7-, HLA-DR+, sig lambda+(dim).  Blasts were not increased 1.2%; hypercellular for age: 49%; JAK2 V617F mutation was negative.  CALR mutation positive.  Flow cytometry included about 1% CLL/SLL phenotype cells (BM8+) of uncertain significance and some infiltrate into the marrow with increased atypical megakaryocytes.  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  MDS FISH panel was negative. ?  ?Patient also follows up with [facility] benign hematology Dr. Sabino Dick for his anemia.  Patient's hemoglobin was drifting down to the eights and was started on EPO in October 2022. ?  ? ?Interval  history- Patient returns to clinic for continuation of EPO. He had ct scan of head yesterday with pcp for weakness of legs. Otherwise feels at baseline. No shortness of breath, fatigue, or abnormal bleeding or bruising. Melatonin has improved his sleep. He takes a calcium supplement for past week.  ? ?ECOG PS- 1 ?Pain scale- 0 ? ?Review of systems- Review of Systems  ?Constitutional:  Negative for chills, fever, malaise/fatigue and weight loss.  ?HENT:  Negative for congestion, ear discharge and nosebleeds.   ?Eyes:  Negative for blurred vision.  ?Respiratory:  Negative for cough, hemoptysis, sputum production, shortness of breath and wheezing.   ?Cardiovascular:  Negative for chest pain, palpitations, orthopnea and claudication.  ?Gastrointestinal:  Negative for abdominal pain, blood in stool, constipation, diarrhea, heartburn, melena, nausea and vomiting.  ?Genitourinary:  Negative for dysuria, flank pain, frequency, hematuria and urgency.  ?Musculoskeletal:  Negative for back pain, joint pain and myalgias.  ?Skin:  Negative for rash.  ?Neurological:  Negative for dizziness, tingling, focal weakness, seizures, weakness and headaches.  ?Endo/Heme/Allergies:  Does not bruise/bleed easily.  ?Psychiatric/Behavioral:  Negative for depression and suicidal ideas. The patient does not have insomnia.    ? ?Allergies  ?Allergen Reactions  ? Meloxicam Nausea And Vomiting  ? ?Past Medical History:  ?Diagnosis Date  ? Arthritis   ? Benign hypertensive renal disease   ? GERD (gastroesophageal reflux disease)   ? Heartburn   ? History of retinal detachment   ? Hyperlipidemia   ? Hypertension   ? Melanoma ([place])   ? 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 approximately 10-15 years ago  ? Myelofibrosis ([place])   ? Prostate hypertrophy   ? Stroke [facility]) June 2009  ? R brain subcortical  infarct  ? ?Past Surgical History:  ?Procedure Laterality Date  ? ASPIRATION / INJECTION RENAL CYST  Feb 2017  ? BACK SURGERY    ? approx 20- 25 years  ago  ? GAS INSERTION  08/11/2011  ? Procedure: INSERTION OF GAS;  Surgeon: Hay[facility] Pedro, MD;  Location: [place];  Service: Ophthalmology;  Laterality: Right;  C3F8  ? SCLERAL BUCKLE  08/11/2011  ? Procedure: SCLERAL BUCKLE;  Surgeon: [facility]den Pedro, MD;  Location: [place];  Service: Ophthalmology;  Laterality: Right;  ? VARICOSE VEIN SURGERY    ? ?Social History  ? ?Socioeconomic History  ? Marital status: Married  ?  Spouse name: Parke Simmers   ? Number of children: 2  ? Years of education: 12+  ? Highest education level: Some college, no degree  ?Occupational History  ? Occupation: Museum/gallery curator  ?  Employer: OTHER  ?  Comment: community  ? Occupation: SELF EMPLOYED  ?  Employer: SELF EMPLOYED  ?Tobacco Use  ? Smoking status: Former  ?  Packs/day: 0.25  ?  Types: Cigarettes  ?  Quit date: 06/06/2010  ?  Years since quitting: 11.3  ? Smokeless tobacco: Never  ?Vaping Use  ? Vaping Use: Never used  ?Substance and Sexual Activity  ? Alcohol use: No  ?  Alcohol/week: 0.0 standard drinks  ? Drug use: No  ? Sexual activity: Not Currently  ?Other Topics Concern  ? Not on file  ?Social History Narrative  ? Pt lives at home with his family.  ? Caffeine Use- 2 cups daily  ? Patient has 2 children.   ? Patient has some college.   ? Patient is right handed.   ?   ?   ? Works full time  ? ?Social Determinants of Health  ? ?Financial Resource Strain: Low Risk   ? Difficulty of Paying Living Expenses: Not hard at all  ?Food Insecurity: No Food Insecurity  ? Worried About Charity fundraiser in the Last Year: Never true  ? Ran Out of Food in the Last Year: Never true  ?Transportation Needs: No Transportation Needs  ? Lack of Transportation (Medical): No  ? Lack of Transportation (Non-Medical): No  ?Physical Activity: Inactive  ? Days of Exercise per Week: 0 days  ? Minutes of Exercise per Session: 0 min  ?Stress: No Stress Concern Present  ? Feeling of Stress : Not at all  ?Social Connections: Socially Integrated  ? Frequency of Communication with  Friends and Family: Three times a week  ? Frequency of Social Gatherings with Friends and Family: More than three times a week  ? Attends Religious Services: More than 4 times per year  ? Active Member of Clubs or Organizations: Yes  ? Attends Archivist Meetings: More than 4 times per year  ? Marital Status: Married  ?Intimate Partner Violence: Not At Risk  ? Fear of Current or Ex-Partner: No  ? Emotionally Abused: No  ? Physically Abused: No  ? Sexually Abused: No  ? ?Family History  ?Problem Relation Age of Onset  ? Heart disease Father   ? Diabetes Son   ? Kidney disease Neg Hx   ? Prostate cancer Neg Hx   ? ?Current Outpatient Medications:  ?  albuterol (VENTOLIN HFA) 108 (90 Base) MCG/ACT inhaler, Inhale 2 puffs into the lungs every 6 (six) hours as needed for wheezing or shortness of breath., Disp: 8 g, Rfl: 0 ?  amLODipine (NORVASC) 10 MG tablet, Take  1 tablet (10 mg total) by mouth daily. In the evening, Disp: 90 tablet, Rfl: 3 ?  azelastine (ASTELIN) 0.1 % nasal spray, Place 1 spray into both nostrils 2 (two) times daily. Use in each nostril as directed, Disp: 30 mL, Rfl: 0 ?  calcium carbonate (OSCAL) 1500 (600 Ca) MG TABS tablet, Take by mouth., Disp: , Rfl:  ?  Cholecalciferol (D3-1000 PO), Take by mouth daily., Disp: , Rfl:  ?  clopidogrel (PLAVIX) 75 MG tablet, TAKE ONE TABLET BY MOUTH EVERY DAY, Disp: 90 tablet, Rfl: 1 ?  DULoxetine (CYMBALTA) 20 MG capsule, TAKE 1 CAPSULE BY MOUTH EVERY DAY, Disp: 90 capsule, Rfl: 1 ?  dutasteride (AVODART) 0.5 MG capsule, Take 1 capsule (0.5 mg total) by mouth daily., Disp: 30 capsule, Rfl: 1 ?  gabapentin (NEURONTIN) 300 MG capsule, Take 1 tab in the AM and 2 tabs at bedtime., Disp: 270 capsule, Rfl: 0 ?  Ginsengs-Saw Palmetto (MULTI GINSENG & SAW PALMETTO) 500 MG CAPS, Take 500 mg by mouth daily., Disp: , Rfl:  ?  losartan (COZAAR) 100 MG tablet, Take 1 tablet (100 mg total) by mouth in the morning., Disp: 90 tablet, Rfl: 3 ?  meclizine (ANTIVERT) 25 MG  tablet, TAKE 1 TABLET BY MOUTH 3 TIMES DAILY AS NEEDED FOR DIZZINESS, Disp: 90 tablet, Rfl: 6 ?  Multiple Vitamin (MULITIVITAMIN WITH MINERALS) TABS, Take 1 tablet by mouth daily., Disp: , Rfl:  ?  Naproxen S

## 2021-10-13 NOTE — Progress Notes (Signed)
Patient states that he has been feeling "unsteady on his feet" and had a CT scan done yesterday ordered by his PCP which was "normal" ?

## 2021-10-18 ENCOUNTER — Other Ambulatory Visit: Payer: Medicare Other

## 2021-10-20 ENCOUNTER — Inpatient Hospital Stay: Payer: Medicare Other

## 2021-10-20 VITALS — BP 128/73 | HR 72

## 2021-10-20 DIAGNOSIS — D7581 Myelofibrosis: Secondary | ICD-10-CM | POA: Diagnosis not present

## 2021-10-20 DIAGNOSIS — D630 Anemia in neoplastic disease: Secondary | ICD-10-CM | POA: Diagnosis not present

## 2021-10-20 DIAGNOSIS — D631 Anemia in chronic kidney disease: Secondary | ICD-10-CM | POA: Diagnosis not present

## 2021-10-20 DIAGNOSIS — D474 Osteomyelofibrosis: Secondary | ICD-10-CM | POA: Diagnosis not present

## 2021-10-20 DIAGNOSIS — N183 Chronic kidney disease, stage 3 unspecified: Secondary | ICD-10-CM | POA: Diagnosis not present

## 2021-10-20 DIAGNOSIS — D473 Essential (hemorrhagic) thrombocythemia: Secondary | ICD-10-CM | POA: Diagnosis not present

## 2021-10-20 DIAGNOSIS — D649 Anemia, unspecified: Secondary | ICD-10-CM

## 2021-10-20 LAB — HEMOGLOBIN AND HEMATOCRIT, BLOOD
HCT: 31.6 % — ABNORMAL LOW (ref 39.0–52.0)
Hemoglobin: 9.8 g/dL — ABNORMAL LOW (ref 13.0–17.0)

## 2021-10-20 MED ORDER — EPOETIN ALFA-EPBX 20000 UNIT/ML IJ SOLN: 50000.0000 [IU] | INTRAMUSCULAR | Status: DC

## 2021-10-20 MED ORDER — EPOETIN ALFA-EPBX 10000 UNIT/ML IJ SOLN
10000.0000 [IU] | Freq: Once | INTRAMUSCULAR | Status: AC
  Administered 2021-10-20: 10000 [IU] via SUBCUTANEOUS
  Filled 2021-10-20: qty 1

## 2021-10-20 MED ORDER — EPOETIN ALFA-EPBX 40000 UNIT/ML IJ SOLN
40000.0000 [IU] | Freq: Once | INTRAMUSCULAR | Status: AC
  Administered 2021-10-20: 40000 [IU] via SUBCUTANEOUS
  Filled 2021-10-20: qty 1

## 2021-10-28 ENCOUNTER — Inpatient Hospital Stay: Payer: Medicare Other

## 2021-10-28 DIAGNOSIS — D630 Anemia in neoplastic disease: Secondary | ICD-10-CM | POA: Diagnosis not present

## 2021-10-28 DIAGNOSIS — D473 Essential (hemorrhagic) thrombocythemia: Secondary | ICD-10-CM | POA: Diagnosis not present

## 2021-10-28 DIAGNOSIS — D7581 Myelofibrosis: Secondary | ICD-10-CM

## 2021-10-28 DIAGNOSIS — D631 Anemia in chronic kidney disease: Secondary | ICD-10-CM

## 2021-10-28 DIAGNOSIS — D649 Anemia, unspecified: Secondary | ICD-10-CM

## 2021-10-28 DIAGNOSIS — N183 Chronic kidney disease, stage 3 unspecified: Secondary | ICD-10-CM | POA: Diagnosis not present

## 2021-10-28 DIAGNOSIS — D474 Osteomyelofibrosis: Secondary | ICD-10-CM | POA: Diagnosis not present

## 2021-10-28 LAB — HEMOGLOBIN AND HEMATOCRIT, BLOOD
HCT: 31.8 % — ABNORMAL LOW (ref 39.0–52.0)
Hemoglobin: 10 g/dL — ABNORMAL LOW (ref 13.0–17.0)

## 2021-11-02 ENCOUNTER — Ambulatory Visit (INDEPENDENT_AMBULATORY_CARE_PROVIDER_SITE_OTHER): Payer: Medicare Other | Admitting: Family Medicine

## 2021-11-02 ENCOUNTER — Encounter: Payer: Self-pay | Admitting: Family Medicine

## 2021-11-02 DIAGNOSIS — R351 Nocturia: Secondary | ICD-10-CM

## 2021-11-02 DIAGNOSIS — N401 Enlarged prostate with lower urinary tract symptoms: Secondary | ICD-10-CM

## 2021-11-02 MED ORDER — TADALAFIL 5 MG PO TABS: 5.0000 mg | ORAL_TABLET | Freq: Every day | ORAL | 3 refills | Status: DC

## 2021-11-02 NOTE — Progress Notes (Signed)
BP 137/68   Pulse 65   Temp 98.4 F (36.9 C) (Oral)   Wt 187 lb 9.6 oz (85.1 kg)   SpO2 97%   BMI 26.92 kg/m    Subjective:    Patient ID: [name], male    DOB: 1941-04-05, 81 y.o.   MRN: [id number]  HPI: [name] is a 81 y.o. male  Chief Complaint  Patient presents with   Urinary Frequency    Pt states he is still getting up about 3 times a night to use the bathroom.    Dizziness    Pt states his dizziness is a little bit better    BPH BPH status: uncontrolled Satisfied with current treatment?: no Medication side effects: no Medication compliance: excellent compliance Duration: chronic Nocturia: 3-4x per night Urinary frequency:yes Incomplete voiding: yes Urgency: yes Weak urinary stream: yes Straining to start stream: yes Dysuria: no Onset: gradual Severity: moderate  Relevant past medical, surgical, family and social history reviewed and updated as indicated. Interim medical history since our last visit reviewed. Allergies and medications reviewed and updated.  Review of Systems  Constitutional: Negative.   Respiratory: Negative.    Cardiovascular: Negative.   Musculoskeletal: Negative.   Neurological: Negative.   Psychiatric/Behavioral: Negative.     Per HPI unless specifically indicated above     Objective:    BP 137/68   Pulse 65   Temp 98.4 F (36.9 C) (Oral)   Wt 187 lb 9.6 oz (85.1 kg)   SpO2 97%   BMI 26.92 kg/m   Wt Readings from Last 3 Encounters:  11/02/21 187 lb 9.6 oz (85.1 kg)  10/13/21 189 lb (85.7 kg)  10/12/21 186 lb 9.6 oz (84.6 kg)    Physical Exam Vitals and nursing note reviewed.  Constitutional:      General: He is not in acute distress.    Appearance: Normal appearance. He is not ill-appearing, toxic-appearing or diaphoretic.  HENT:     Head: Normocephalic and atraumatic.     Right Ear: External ear normal.     Left Ear: External ear normal.     Nose: Nose normal.     Mouth/Throat:     Mouth: Mucous  membranes are moist.     Pharynx: Oropharynx is clear.  Eyes:     General: No scleral icterus.       Right eye: No discharge.        Left eye: No discharge.     Extraocular Movements: Extraocular movements intact.     Conjunctiva/sclera: Conjunctivae normal.     Pupils: Pupils are equal, round, and reactive to light.  Cardiovascular:     Rate and Rhythm: Normal rate and regular rhythm.     Pulses: Normal pulses.     Heart sounds: Normal heart sounds. No murmur heard.   No friction rub. No gallop.  Pulmonary:     Effort: Pulmonary effort is normal. No respiratory distress.     Breath sounds: Normal breath sounds. No stridor. No wheezing, rhonchi or rales.  Chest:     Chest wall: No tenderness.  Musculoskeletal:        General: Normal range of motion.     Cervical back: Normal range of motion and neck supple.  Skin:    General: Skin is warm and dry.     Capillary Refill: Capillary refill takes less than 2 seconds.     Coloration: Skin is not jaundiced or pale.     Findings: No  bruising, erythema, lesion or rash.  Neurological:     General: No focal deficit present.     Mental Status: He is alert and oriented to person, place, and time. Mental status is at baseline.  Psychiatric:        Mood and Affect: Mood normal.        Behavior: Behavior normal.        Thought Content: Thought content normal.        Judgment: Judgment normal.    Results for orders placed or performed in visit on 10/28/21  Hemoglobin and Hematocrit, Blood  Result Value Ref Range   Hemoglobin 10.0 (L) 13.0 - 17.0 g/dL   HCT 31.8 (L) 39.0 - 52.0 %      Assessment & Plan:   Problem List Items Addressed This Visit       Genitourinary   BPH (benign prostatic hyperplasia)    No better with avodart. Will stop. Continue flomax. Will change to daily cialis. Stop PRN cialis. Recheck 1 month. Call with any concerns.          Follow up plan: Return in about 4 weeks (around 11/30/2021).

## 2021-11-02 NOTE — Assessment & Plan Note (Signed)
No better with avodart. Will stop. Continue flomax. Will change to daily cialis. Stop PRN cialis. Recheck 1 month. Call with any concerns.

## 2021-11-03 ENCOUNTER — Inpatient Hospital Stay: Payer: Medicare Other

## 2021-11-03 DIAGNOSIS — D473 Essential (hemorrhagic) thrombocythemia: Secondary | ICD-10-CM | POA: Diagnosis not present

## 2021-11-03 DIAGNOSIS — D631 Anemia in chronic kidney disease: Secondary | ICD-10-CM | POA: Diagnosis not present

## 2021-11-03 DIAGNOSIS — D649 Anemia, unspecified: Secondary | ICD-10-CM

## 2021-11-03 DIAGNOSIS — D474 Osteomyelofibrosis: Secondary | ICD-10-CM | POA: Diagnosis not present

## 2021-11-03 DIAGNOSIS — M9901 Segmental and somatic dysfunction of cervical region: Secondary | ICD-10-CM | POA: Diagnosis not present

## 2021-11-03 DIAGNOSIS — M5412 Radiculopathy, cervical region: Secondary | ICD-10-CM | POA: Diagnosis not present

## 2021-11-03 DIAGNOSIS — M5033 Other cervical disc degeneration, cervicothoracic region: Secondary | ICD-10-CM | POA: Diagnosis not present

## 2021-11-03 DIAGNOSIS — N183 Chronic kidney disease, stage 3 unspecified: Secondary | ICD-10-CM | POA: Diagnosis not present

## 2021-11-03 DIAGNOSIS — D630 Anemia in neoplastic disease: Secondary | ICD-10-CM | POA: Diagnosis not present

## 2021-11-03 DIAGNOSIS — M9903 Segmental and somatic dysfunction of lumbar region: Secondary | ICD-10-CM | POA: Diagnosis not present

## 2021-11-03 DIAGNOSIS — D7581 Myelofibrosis: Secondary | ICD-10-CM

## 2021-11-03 LAB — HEMOGLOBIN AND HEMATOCRIT, BLOOD
HCT: 36.4 % — ABNORMAL LOW (ref 39.0–52.0)
Hemoglobin: 11.2 g/dL — ABNORMAL LOW (ref 13.0–17.0)

## 2021-11-09 ENCOUNTER — Inpatient Hospital Stay: Payer: Medicare Other

## 2021-11-09 ENCOUNTER — Inpatient Hospital Stay: Payer: Medicare Other | Attending: Oncology

## 2021-11-09 VITALS — BP 141/82 | HR 81

## 2021-11-09 DIAGNOSIS — D473 Essential (hemorrhagic) thrombocythemia: Secondary | ICD-10-CM | POA: Diagnosis not present

## 2021-11-09 DIAGNOSIS — D649 Anemia, unspecified: Secondary | ICD-10-CM

## 2021-11-09 DIAGNOSIS — D7581 Myelofibrosis: Secondary | ICD-10-CM | POA: Insufficient documentation

## 2021-11-09 DIAGNOSIS — D631 Anemia in chronic kidney disease: Secondary | ICD-10-CM

## 2021-11-09 LAB — HEMOGLOBIN AND HEMATOCRIT, BLOOD
HCT: 29.2 % — ABNORMAL LOW (ref 39.0–52.0)
Hemoglobin: 9.4 g/dL — ABNORMAL LOW (ref 13.0–17.0)

## 2021-11-09 MED ORDER — EPOETIN ALFA-EPBX 10000 UNIT/ML IJ SOLN
10000.0000 [IU] | Freq: Once | INTRAMUSCULAR | Status: AC
  Administered 2021-11-09: 10000 [IU] via SUBCUTANEOUS
  Filled 2021-11-09: qty 1

## 2021-11-09 MED ORDER — EPOETIN ALFA-EPBX 40000 UNIT/ML IJ SOLN
40000.0000 [IU] | Freq: Once | INTRAMUSCULAR | Status: AC
  Administered 2021-11-09: 40000 [IU] via SUBCUTANEOUS
  Filled 2021-11-09: qty 1

## 2021-11-09 MED ORDER — EPOETIN ALFA-EPBX 20000 UNIT/ML IJ SOLN: 50000.0000 [IU] | INTRAMUSCULAR | Status: DC

## 2021-11-10 ENCOUNTER — Inpatient Hospital Stay: Payer: Medicare Other

## 2021-11-14 NOTE — Progress Notes (Unsigned)
Evaluation Performed:  Follow-up visit  Date:  11/15/2021   ID:  [name], DOB 17-Sep-1940, MRN [id number]  Patient Location:  [place] [address]   Provider location:   [facility], [place] office  PCP:  [name], DO  Cardiologist:  [name]  Chief Complaint  Patient presents with   6 month follow up & discuss HTN    "Doing well." Medications reviewed by the patient verbally.     History of Present Illness:    [name] is a 81 y.o. male  past medical history of myelofibrosis diagnosed 06/11/2014.  long history of normocytic anemia unresponsive to oral iron. 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 CR 1.3  Had EKG, CXR,  CT scan: No aortic dissection as clinically questioned.  -- Splenomegaly consistent with reported history of myelofibrosis.   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   Past Medical History:  Diagnosis Date   Arthritis    Benign hypertensive renal disease    GERD (gastroesophageal reflux disease)     Heartburn    History of retinal detachment    Hyperlipidemia    Hypertension    Melanoma ([place])    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 approximately 10-15 years ago   Myelofibrosis ([name])    Prostate hypertrophy    Stroke [facility]) June 2009   R brain subcortical infarct   Past Surgical History:  Procedure Laterality Date   ASPIRATION / INJECTION RENAL CYST  Feb 2017   BACK SURGERY     approx 20- 25 years ago   GAS INSERTION  08/11/2011   Procedure: INSERTION OF GAS;  Surgeon: Hayden Pedro, MD;  Location: [place];  Service: Ophthalmology;  Laterality: Right;  C3F8   SCLERAL BUCKLE  08/11/2011   Procedure: SCLERAL BUCKLE;  Surgeon: Hayden Pedro, MD;  Location: [place];  Service: Ophthalmology;  Laterality: Right;   VARICOSE VEIN SURGERY       Current Meds  Medication Sig   albuterol (VENTOLIN HFA) 108 (90 Base) MCG/ACT inhaler Inhale 2 puffs into the lungs every 6 (six) hours as needed for wheezing or shortness of breath.   amLODipine (NORVASC) 10 MG tablet Take 1 tablet (10 mg total) by mouth daily. In the evening   azelastine (ASTELIN) 0.1 % nasal spray Place 1 spray into both nostrils 2 (two) times daily. Use in each nostril as directed   calcium carbonate (OSCAL) 1500 (600 Ca) MG TABS tablet Take by mouth.   Cholecalciferol (D3-1000 PO) Take by mouth daily.   clopidogrel (PLAVIX) 75  MG tablet TAKE ONE TABLET BY MOUTH EVERY DAY   DULoxetine (CYMBALTA) 20 MG capsule TAKE 1 CAPSULE BY MOUTH EVERY DAY   gabapentin (NEURONTIN) 300 MG capsule Take 1 tab in the AM and 2 tabs at bedtime.   Ginsengs-Saw Palmetto (MULTI GINSENG & SAW PALMETTO) 500 MG CAPS Take 500 mg by mouth daily.   losartan (COZAAR) 100 MG tablet Take 1 tablet (100 mg total) by mouth in the morning.   meclizine (ANTIVERT) 25 MG tablet TAKE 1 TABLET BY MOUTH 3 TIMES DAILY AS NEEDED FOR DIZZINESS   Multiple Vitamin (MULITIVITAMIN WITH MINERALS) TABS Take 1 tablet by mouth daily.   Naproxen Sodium 220 MG CAPS Take by mouth  as needed.   omeprazole (PRILOSEC) 40 MG capsule Take 1 capsule (40 mg total) by mouth daily.   simvastatin (ZOCOR) 40 MG tablet Take 1 tablet (40 mg total) by mouth daily.   tadalafil (CIALIS) 5 MG tablet Take 1 tablet (5 mg total) by mouth daily.   tamsulosin (FLOMAX) 0.4 MG CAPS capsule Take 2 capsules (0.8 mg total) by mouth daily.   zinc gluconate 50 MG tablet Take by mouth.     Allergies:   Meloxicam   Social History   Tobacco Use   Smoking status: Former    Packs/day: 0.25    Types: Cigarettes    Quit date: 06/06/2010    Years since quitting: 11.4   Smokeless tobacco: Never  Vaping Use   Vaping Use: Never used  Substance Use Topics   Alcohol use: No    Alcohol/week: 0.0 standard drinks of alcohol   Drug use: No     Family Hx: The patient's family history includes Diabetes in his son; Heart disease in his father. There is no history of Kidney disease or Prostate cancer.  ROS:   Please see the history of present illness.    Review of Systems  Constitutional: Negative.   HENT: Negative.    Respiratory: Negative.    Cardiovascular: Negative.   Gastrointestinal: Negative.   Musculoskeletal: Negative.   Neurological: Negative.   Psychiatric/Behavioral: Negative.    All other systems reviewed and are negative.     Labs/Other Tests and Data Reviewed:    Recent Labs: 07/22/2021: TSH 5.660 10/07/2021: ALT 19 10/13/2021: BUN 35; Creatinine, Ser 1.70; Platelets 281; Potassium 4.6; Sodium 134 11/09/2021: Hemoglobin 9.4   Recent Lipid Panel Lab Results  Component Value Date/Time   CHOL 134 07/22/2021 02:16 PM   CHOL 106 04/14/2016 08:58 AM   TRIG 91 07/22/2021 02:16 PM   TRIG 99 04/14/2016 08:58 AM   HDL 37 (L) 07/22/2021 02:16 PM   CHOLHDL 2.8 12/10/2012 03:22 PM   CHOLHDL 6.4 11/21/2007 03:25 AM   LDLCALC 80 07/22/2021 02:16 PM    Wt Readings from Last 3 Encounters:  11/15/21 188 lb 4 oz (85.4 kg)  11/02/21 187 lb 9.6 oz (85.1 kg)  10/13/21 189 lb (85.7 kg)      Exam:    BP 120/60 (BP Location: Left Arm, Patient Position: Sitting, Cuff Size: Normal)   Pulse 71   Ht '5\' 10"'$  (1.778 m)   Wt 188 lb 4 oz (85.4 kg)   SpO2 97%   BMI 27.01 kg/m  Constitutional:  oriented to person, place, and time. No distress.  HENT:  Head: Grossly normal Eyes:  no discharge. No scleral icterus.  Neck: No JVD, no carotid bruits  Cardiovascular: Regular rate and rhythm, no murmurs appreciated Pulmonary/Chest: Clear to auscultation bilaterally, no wheezes  or rails Abdominal: Soft.  no distension.  no tenderness.  Musculoskeletal: Normal range of motion Neurological:  normal muscle tone. Coordination normal. No atrophy Skin: Skin warm and dry Psychiatric: normal affect, pleasant  ASSESSMENT & PLAN:    PAD (peripheral artery disease) (HCC) History of hyperlipidemia, intracranial atherosclerosis on prior scanning Denies claudication symptoms,  Cholesterol at goal, no further testing needed  Cerebrovascular accident (CVA), unspecified mechanism ([place]) On Plavix, cholesterol at goal, non-smoker, no diabetes No recent episodes of TIA or stroke  Orthostatic hypotension HCTZ previously held for elevated creatinine, orthostasis Blood pressure low but stable, denies any symptoms No changes made  Essential hypertension Blood pressure is well controlled on today's visit. No changes made to the medications.  Recommend he stay hydrated  Mixed hyperlipidemia Cholesterol is at goal on the current lipid regimen. No changes to the medications were made.  Chronic anemia Managed by hematology, on EPO, hemoglobin of 9 up to 10  Anxiety/adjustment disorder/stress Stable, busy with building   Total encounter time more than 30 minutes  Greater than 50% was spent in counseling and coordination of care with the patient   Signed, Ida Rogue, MD  11/15/2021 2:16 PM    [facility] [address], [place], [place]  [address]

## 2021-11-15 ENCOUNTER — Encounter: Payer: Self-pay | Admitting: Cardiovascular Disease

## 2021-11-15 ENCOUNTER — Ambulatory Visit (INDEPENDENT_AMBULATORY_CARE_PROVIDER_SITE_OTHER): Payer: Medicare Other | Admitting: Cardiovascular Disease

## 2021-11-15 VITALS — BP 120/60 | HR 71 | Ht 70.0 in | Wt 188.2 lb

## 2021-11-15 DIAGNOSIS — I639 Cerebral infarction, unspecified: Secondary | ICD-10-CM

## 2021-11-15 DIAGNOSIS — I951 Orthostatic hypotension: Secondary | ICD-10-CM | POA: Diagnosis not present

## 2021-11-15 DIAGNOSIS — I129 Hypertensive chronic kidney disease with stage 1 through stage 4 chronic kidney disease, or unspecified chronic kidney disease: Secondary | ICD-10-CM | POA: Diagnosis not present

## 2021-11-15 DIAGNOSIS — E782 Mixed hyperlipidemia: Secondary | ICD-10-CM | POA: Diagnosis not present

## 2021-11-15 NOTE — Patient Instructions (Signed)
Medication Instructions:  No changes  If you need a refill on your cardiac medications before your next appointment, please call your pharmacy.   Lab work: No new labs needed  Testing/Procedures: No new testing needed  Follow-Up: At [facility], you and your health needs are our priority.  As part of our continuing mission to provide you with exceptional heart care, we have created designated Provider Care Teams.  These Care Teams include your primary Cardiologist (physician) and Advanced Practice Providers (APPs -  Physician Assistants and Nurse Practitioners) who all work together to provide you with the care you need, when you need it.  You will need a follow up appointment in 12 months  Providers on your designated Care Team:   Christopher Berge, NP Ryan Dunn, PA-C Cadence Furth, PA-C  COVID-19 Vaccine Information can be found at: [email or web address] For questions related to vaccine distribution or appointments, please email [email or web address] or call [phone].   

## 2021-11-17 ENCOUNTER — Inpatient Hospital Stay: Payer: Medicare Other

## 2021-11-17 ENCOUNTER — Other Ambulatory Visit: Payer: Self-pay

## 2021-11-17 VITALS — BP 132/83

## 2021-11-17 DIAGNOSIS — D631 Anemia in chronic kidney disease: Secondary | ICD-10-CM

## 2021-11-17 DIAGNOSIS — D7581 Myelofibrosis: Secondary | ICD-10-CM | POA: Diagnosis not present

## 2021-11-17 DIAGNOSIS — D649 Anemia, unspecified: Secondary | ICD-10-CM

## 2021-11-17 DIAGNOSIS — D473 Essential (hemorrhagic) thrombocythemia: Secondary | ICD-10-CM | POA: Diagnosis not present

## 2021-11-17 LAB — HEMOGLOBIN AND HEMATOCRIT, BLOOD
HCT: 30.7 % — ABNORMAL LOW (ref 39.0–52.0)
Hemoglobin: 9.5 g/dL — ABNORMAL LOW (ref 13.0–17.0)

## 2021-11-17 MED ORDER — EPOETIN ALFA-EPBX 20000 UNIT/ML IJ SOLN: 50000.0000 [IU] | INTRAMUSCULAR | Status: DC

## 2021-11-17 MED ORDER — EPOETIN ALFA-EPBX 10000 UNIT/ML IJ SOLN
10000.0000 [IU] | Freq: Once | INTRAMUSCULAR | Status: AC
  Administered 2021-11-17: 10000 [IU] via SUBCUTANEOUS
  Filled 2021-11-17: qty 1

## 2021-11-17 MED ORDER — EPOETIN ALFA-EPBX 40000 UNIT/ML IJ SOLN
40000.0000 [IU] | Freq: Once | INTRAMUSCULAR | Status: AC
  Administered 2021-11-17: 40000 [IU] via SUBCUTANEOUS
  Filled 2021-11-17: qty 1

## 2021-11-18 ENCOUNTER — Encounter: Payer: Self-pay | Admitting: Dermatology

## 2021-11-18 ENCOUNTER — Ambulatory Visit (INDEPENDENT_AMBULATORY_CARE_PROVIDER_SITE_OTHER): Payer: Medicare Other | Admitting: Dermatology

## 2021-11-18 DIAGNOSIS — D1800 Hemangioma unspecified site: Secondary | ICD-10-CM | POA: Diagnosis not present

## 2021-11-18 DIAGNOSIS — L578 Other skin changes due to chronic exposure to nonionizing radiation: Secondary | ICD-10-CM | POA: Diagnosis not present

## 2021-11-18 DIAGNOSIS — L820 Inflamed seborrheic keratosis: Secondary | ICD-10-CM | POA: Diagnosis not present

## 2021-11-18 DIAGNOSIS — L720 Epidermal cyst: Secondary | ICD-10-CM

## 2021-11-18 DIAGNOSIS — L814 Other melanin hyperpigmentation: Secondary | ICD-10-CM | POA: Diagnosis not present

## 2021-11-18 DIAGNOSIS — L821 Other seborrheic keratosis: Secondary | ICD-10-CM | POA: Diagnosis not present

## 2021-11-18 DIAGNOSIS — L905 Scar conditions and fibrosis of skin: Secondary | ICD-10-CM | POA: Diagnosis not present

## 2021-11-18 DIAGNOSIS — I639 Cerebral infarction, unspecified: Secondary | ICD-10-CM

## 2021-11-18 DIAGNOSIS — Z1283 Encounter for screening for malignant neoplasm of skin: Secondary | ICD-10-CM

## 2021-11-18 DIAGNOSIS — D229 Melanocytic nevi, unspecified: Secondary | ICD-10-CM

## 2021-11-18 DIAGNOSIS — Z85820 Personal history of malignant melanoma of skin: Secondary | ICD-10-CM | POA: Diagnosis not present

## 2021-11-18 NOTE — Progress Notes (Unsigned)
Follow-Up Visit   Subjective  [name] is a 81 y.o. male who presents for the following: Annual Exam (Skin cancer screening. Upper body exam. HxMM. HxAK's. Areas of concern today). The patient presents for Total-Body Skin Exam (TBSE) for skin cancer screening and mole check.  The patient has spots, moles and lesions to be evaluated, some may be new or changing and the patient has concerns that these could be cancer.  The following portions of the chart were reviewed this encounter and updated as appropriate:  Tobacco  Allergies  Meds  Problems  Med Hx  Surg Hx  Fam Hx     Review of Systems: No other skin or systemic complaints except as noted in HPI or Assessment and Plan.  Objective  Well appearing patient in no apparent distress; mood and affect are within normal limits.  All skin waist up examined.  Left Upper Back Linear atrophic lesion  Right Forearm x1, left forearm x1 (2) Erythematous keratotic or waxy stuck-on papule or plaque.  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  - Call if any new or changing lesions are noted between office visits   Lentigines - Scattered tan macules - Due to sun exposure - Benign-appearing, observe - Recommend daily broad spectrum sunscreen SPF 30+ to sun-exposed areas, reapply every 2 hours as needed. - Call for any changes  Seborrheic Keratoses - Stuck-on, waxy, tan-brown papules and/or plaques  - Benign-appearing - Discussed benign etiology and prognosis. - Observe - Call for any changes  Melanocytic Nevi - Tan-brown and/or pink-flesh-colored symmetric macules and papules - Benign appearing on exam today - Observation - Call clinic for new or  changing moles - Recommend daily use of broad spectrum spf 30+ sunscreen to sun-exposed areas.   Hemangiomas - Red papules - Discussed benign nature - Observe - Call for any changes  Actinic Damage - Chronic condition, secondary to cumulative UV/sun exposure - diffuse scaly erythematous macules with underlying dyspigmentation - Recommend daily broad spectrum sunscreen SPF 30+ to sun-exposed areas, reapply every 2 hours as needed.  - Staying in the shade or wearing long sleeves, sun glasses (UVA+UVB protection) and wide brim hats (4-inch brim around the entire circumference of the hat) are also recommended for sun protection.  - Call for new or changing lesions.  Skin cancer screening performed today.  Scar Left Upper Back Secondary to cyst excision.  Inflamed seborrheic keratosis (2) Right Forearm x1, left forearm x1 Symptomatic, irritating, patient would like treated. Destruction of lesion - Right Forearm x1, left forearm x1 Complexity: simple   Destruction method: cryotherapy   Informed consent: discussed and consent obtained   Timeout:  patient name, date of birth, surgical site, and procedure verified Lesion destroyed using liquid nitrogen: Yes   Region frozen until ice ball extended beyond lesion: Yes   Outcome: patient tolerated procedure well with no complications   Post-procedure details: wound care instructions given   Additional details:  Prior to procedure, discussed risks of blister formation, small wound, skin dyspigmentation, or rare scar following cryotherapy. Recommend Vaseline ointment to treated areas while healing.  Epidermal inclusion cyst Right Lateral Canthus Benign-appearing. Exam most consistent with an epidermal inclusion cyst. Discussed that a cyst is  a benign growth that can grow over time and sometimes get irritated or inflamed. Recommend observation if it is not bothersome. Discussed option of surgical excision to remove it if it is growing,  symptomatic, or other changes noted. Please call for new or changing lesions so they can be evaluated.  Skin cancer screening  Return in about 1 year (around 11/19/2022) for TBSE, HxMM. Documentation: I have reviewed the above documentation for accuracy and completeness, and I agree with the above.  Sarina Ser, MD

## 2021-11-18 NOTE — Patient Instructions (Signed)
Cryotherapy Aftercare  Wash gently with soap and water everyday.   Apply Vaseline daily until healed.   Prior to procedure, discussed risks of blister formation, small wound, skin dyspigmentation, or rare scar following cryotherapy. Recommend Vaseline ointment to treated areas while healing.    Recommend daily broad spectrum sunscreen SPF 30+ to sun-exposed areas, reapply every 2 hours as needed. Call for new or changing lesions.  Staying in the shade or wearing long sleeves, sun glasses (UVA+UVB protection) and wide brim hats (4-inch brim around the entire circumference of the hat) are also recommended for sun protection.    Melanoma ABCDEs  Melanoma is the most dangerous type of skin cancer, and is the leading cause of death from skin disease.  You are more likely to develop melanoma if you: Have light-colored skin, light-colored eyes, or red or blond hair Spend a lot of time in the sun Tan regularly, either outdoors or in a tanning bed Have had blistering sunburns, especially during childhood Have a close family member who has had a melanoma Have atypical moles or large birthmarks  Early detection of melanoma is key since treatment is typically straightforward and cure rates are extremely high if we catch it early.   The first sign of melanoma is often a change in a mole or a new dark spot.  The ABCDE system is a way of remembering the signs of melanoma.  A for asymmetry:  The two halves do not match. B for border:  The edges of the growth are irregular. C for color:  A mixture of colors are present instead of an even brown color. D for diameter:  Melanomas are usually (but not always) greater than 37m - the size of a pencil eraser. E for evolution:  The spot keeps changing in size, shape, and color.  Please check your skin once per month between visits. You can use a small mirror in front and a large mirror behind you to keep an eye on the back side or your body.   If you see  any new or changing lesions before your next follow-up, please call to schedule a visit.  Please continue daily skin protection including broad spectrum sunscreen SPF 30+ to sun-exposed areas, reapplying every 2 hours as needed when you're outdoors.   Staying in the shade or wearing long sleeves, sun glasses (UVA+UVB protection) and wide brim hats (4-inch brim around the entire circumference of the hat) are also recommended for sun protection.     Due to recent changes in healthcare laws, you may see results of your pathology and/or laboratory studies on MyChart before the doctors have had a chance to review them. We understand that in some cases there may be results that are confusing or concerning to you. Please understand that not all results are received at the same time and often the doctors may need to interpret multiple results in order to provide you with the best plan of care or course of treatment. Therefore, we ask that you please give u[place]2 business days to thoroughly review all your results before contacting the office for clarification. Should we see a critical lab result, you will be contacted sooner.   If You Need Anything After Your Visit  If you have any questions or concerns for your doctor, please call our main line at [phone] press option 4 to reach your doctor's medical assistant. If no one answers, please leave a voicemail as directed and we will return your call  as soon as possible. Messages left after 4 pm will be answered the following business day.   You may also send [place] a message via [place]. We typically respond to MyChart messages within 1-2 business days.  For prescription refills, please ask your pharmacy to contact our office. Our fax number is [phone].  If you have an urgent issue when the clinic is closed that cannot wait until the next business day, you can page your doctor at the number below.    Please note that while we do our best to be  available for urgent issues outside of office hours, we are not available 24/7.   If you have an urgent issue and are unable to reach [place], you may choose to seek medical care at your doctor's office, retail clinic, urgent care center, or emergency room.  If you have a medical emergency, please immediately call 911 or go to the emergency department.  Pager Numbers  - Dr. Nehemiah Massed: [phone]  - Dr. Laurence Ferrari: [phone]  - Dr. Nicole Kindred: [phone]  In the event of inclement weather, please call our main line at [phone] for an update on the status of any delays or closures.  Dermatology Medication Tips: Please keep the boxes that topical medications come in in order to help keep track of the instructions about where and how to use these. Pharmacies typically print the medication instructions only on the boxes and not directly on the medication tubes.   If your medication is too expensive, please contact our office at [phone] option 4 or send [place] a message through [place].   We are unable to tell what your co-pay for medications will be in advance as this is different depending on your insurance coverage. However, we may be able to find a substitute medication at lower cost or fill out paperwork to get insurance to cover a needed medication.   If a prior authorization is required to get your medication covered by your insurance company, please allow [place] 1-2 business days to complete this process.  Drug prices often vary depending on where the prescription is filled and some pharmacies may offer cheaper prices.  The website www.goodrx.com contains coupons for medications through different pharmacies. The prices here do not account for what the cost may be with help from insurance (it may be cheaper with your insurance), but the website can give you the price if you did not use any insurance.  - You can print the associated coupon and take it with your prescription to the pharmacy.  -  You may also stop by our office during regular business hours and pick up a GoodRx coupon card.  - If you need your prescription sent electronically to a different pharmacy, notify our office through [facility] or by phone at [phone] option 4.     Si Usted Necesita Algo Despus de Su Visita  Tambin puede enviarnos un mensaje a travs de Pharmacist, community. Por lo general respondemos a los mensajes de MyChart en el transcurso de 1 a 2 das hbiles.  Para renovar recetas, por favor pida a su farmacia que se ponga en contacto con nuestra oficina. Harland Dingwall de fax es [place] [phone].  Si tiene un asunto urgente cuando la clnica est cerrada y que no puede esperar hasta el siguiente da hbil, puede llamar/localizar a su doctor(a) al nmero que aparece a continuacin.   Por favor, tenga en cuenta que aunque hacemos todo lo posible para estar disponibles para asuntos urgentes fuera del  horario de oficina, no estamos disponibles las 24 horas del da, los 7 das de la [place].   Si tiene un problema urgente y no puede comunicarse con nosotros, puede optar por buscar atencin mdica  en el consultorio de su doctor(a), en una clnica privada, en un centro de atencin urgente o en una sala de emergencias.  Si tiene Engineering geologist, por favor llame inmediatamente al 911 o vaya a la sala de emergencias.  Nmeros de bper  - Dr. Nehemiah Massed: [phone]  - Dra. Moye: [phone]  - Dra. Nicole Kindred: [phone]  En caso de inclemencias del [place], por favor llame a Johnsie Kindred principal al [phone] para una actualizacin sobre el [place] de cualquier retraso o cierre.  Consejos para la medicacin en dermatologa: Por favor, guarde las cajas en las que vienen los medicamentos de uso tpico para ayudarle a seguir las instrucciones sobre dnde y cmo usarlos. Las farmacias generalmente imprimen las instrucciones del medicamento slo en las cajas y no directamente en los tubos del  [place].   Si su medicamento es muy caro, por favor, pngase en contacto con Zigmund Daniel llamando al [phone] y presione la opcin 4 o envenos un mensaje a travs de Pharmacist, community.   No podemos decirle cul ser su copago por los medicamentos por adelantado ya que esto es diferente dependiendo de la cobertura de su seguro. Sin embargo, es posible que podamos encontrar un medicamento sustituto a Electrical engineer un formulario para que el seguro cubra el medicamento que se considera necesario.   Si se requiere una autorizacin previa para que su compaa de seguros [place] su medicamento, por favor permtanos de 1 a 2 das hbiles para completar este proceso.  Los precios de los medicamentos varan con frecuencia dependiendo del Environmental consultant de dnde se surte la receta y alguna farmacias pueden ofrecer precios ms baratos.  El sitio web www.goodrx.com tiene cupones para medicamentos de Airline pilot. Los precios aqu no tienen en cuenta lo que podra costar con la ayuda del seguro (puede ser ms barato con su seguro), pero el sitio web puede darle el precio si no utiliz Research scientist (physical sciences).  - Puede imprimir el cupn correspondiente y llevarlo con su receta a la farmacia.  - Tambin puede pasar por nuestra oficina durante el horario de atencin regular y Charity fundraiser una tarjeta de cupones de GoodRx.  - Si necesita que su receta se enve electrnicamente a una farmacia diferente, informe a nuestra oficina a travs de MyChart de [place] o por telfono llamando al [phone] y presione la opcin 4.

## 2021-11-23 ENCOUNTER — Encounter: Payer: Self-pay | Admitting: Dermatology

## 2021-11-24 ENCOUNTER — Inpatient Hospital Stay: Payer: Medicare Other

## 2021-11-24 VITALS — BP 117/70 | HR 71

## 2021-11-24 DIAGNOSIS — D7581 Myelofibrosis: Secondary | ICD-10-CM

## 2021-11-24 DIAGNOSIS — D649 Anemia, unspecified: Secondary | ICD-10-CM

## 2021-11-24 DIAGNOSIS — M9901 Segmental and somatic dysfunction of cervical region: Secondary | ICD-10-CM | POA: Diagnosis not present

## 2021-11-24 DIAGNOSIS — M9903 Segmental and somatic dysfunction of lumbar region: Secondary | ICD-10-CM | POA: Diagnosis not present

## 2021-11-24 DIAGNOSIS — M5033 Other cervical disc degeneration, cervicothoracic region: Secondary | ICD-10-CM | POA: Diagnosis not present

## 2021-11-24 DIAGNOSIS — M5412 Radiculopathy, cervical region: Secondary | ICD-10-CM | POA: Diagnosis not present

## 2021-11-24 DIAGNOSIS — D631 Anemia in chronic kidney disease: Secondary | ICD-10-CM

## 2021-11-24 DIAGNOSIS — D473 Essential (hemorrhagic) thrombocythemia: Secondary | ICD-10-CM | POA: Diagnosis not present

## 2021-11-24 LAB — HEMOGLOBIN AND HEMATOCRIT, BLOOD
HCT: 31.5 % — ABNORMAL LOW (ref 39.0–52.0)
Hemoglobin: 9.7 g/dL — ABNORMAL LOW (ref 13.0–17.0)

## 2021-11-24 MED ORDER — EPOETIN ALFA-EPBX 40000 UNIT/ML IJ SOLN
40000.0000 [IU] | Freq: Once | INTRAMUSCULAR | Status: AC
  Administered 2021-11-24: 40000 [IU] via SUBCUTANEOUS
  Filled 2021-11-24: qty 1

## 2021-11-24 MED ORDER — EPOETIN ALFA-EPBX 10000 UNIT/ML IJ SOLN
10000.0000 [IU] | Freq: Once | INTRAMUSCULAR | Status: AC
  Administered 2021-11-24: 10000 [IU] via SUBCUTANEOUS
  Filled 2021-11-24: qty 1

## 2021-11-24 MED ORDER — EPOETIN ALFA-EPBX 20000 UNIT/ML IJ SOLN: 50000.0000 [IU] | INTRAMUSCULAR | Status: DC

## 2021-12-01 ENCOUNTER — Ambulatory Visit: Payer: Medicare Other | Admitting: Family Medicine

## 2021-12-01 ENCOUNTER — Inpatient Hospital Stay: Payer: Medicare Other

## 2021-12-01 DIAGNOSIS — D7581 Myelofibrosis: Secondary | ICD-10-CM

## 2021-12-01 DIAGNOSIS — N183 Chronic kidney disease, stage 3 unspecified: Secondary | ICD-10-CM

## 2021-12-01 DIAGNOSIS — D649 Anemia, unspecified: Secondary | ICD-10-CM

## 2021-12-01 DIAGNOSIS — D473 Essential (hemorrhagic) thrombocythemia: Secondary | ICD-10-CM | POA: Diagnosis not present

## 2021-12-01 LAB — HEMOGLOBIN AND HEMATOCRIT, BLOOD
HCT: 34.4 % — ABNORMAL LOW (ref 39.0–52.0)
Hemoglobin: 10.5 g/dL — ABNORMAL LOW (ref 13.0–17.0)

## 2021-12-08 ENCOUNTER — Inpatient Hospital Stay: Payer: Medicare Other

## 2021-12-09 ENCOUNTER — Other Ambulatory Visit: Payer: Self-pay | Admitting: Family Medicine

## 2021-12-10 NOTE — Telephone Encounter (Signed)
Requested medication (s) are due for refill today: no  Requested medication (s) are on the active medication list: no  Last refill:  11/02/21  Future visit scheduled: yes  Notes to clinic:  Unable to refill per protocol, Rx expired. Medication was discontinued 11/02/21 by PCP.      Requested Prescriptions  Pending Prescriptions Disp Refills   dutasteride (AVODART) 0.5 MG capsule [Pharmacy Med Name: DUTASTERIDE 0.5 MG CAP] 30 capsule 1    Sig: TAKE ONE CAPSULE BY MOUTH DAILY AS DIRECTED     Urology: 5-alpha Reductase Inhibitors Failed - 12/09/2021  4:25 PM      Failed - PSA in normal range and within 360 days    Prostate Specific Ag, Serum  Date Value Ref Range Status  10/07/2016 1.5 0.0 - 4.0 ng/mL Final    Comment:    Roche ECLIA methodology. According to the American Urological Association, Serum PSA should decrease and remain at undetectable levels after radical prostatectomy. The AUA defines biochemical recurrence as an initial PSA value 0.2 ng/mL or greater followed by a subsequent confirmatory PSA value 0.2 ng/mL or greater. Values obtained with different assay methods or kits cannot be used interchangeably. Results cannot be interpreted as absolute evidence of the presence or absence of malignant disease.          Passed - Valid encounter within last 12 months    Recent Outpatient Visits           1 month ago Benign prostatic hyperplasia with nocturia   [place], [place], DO   1 month ago Dizziness   [place], [place], DO   2 months ago Subacute cough   [place], [place], DO   3 months ago Urinary frequency   [place], [place], DO   4 months ago Benign hypertensive renal disease   [facility] [name], DO       Future Appointments             In 1 week Wynetta Emery, Barb Merino, DO [facility]   In 1 month Wynetta Emery, Barb Merino, DO  [facility]   In 11 months Ralene Bathe, MD [place]            Signed Prescriptions Disp Refills   DULoxetine (CYMBALTA) 20 MG capsule 90 capsule 0    Sig: TAKE 1 CAPSULE BY MOUTH DAILY.     Psychiatry: Antidepressants - SNRI - duloxetine Failed - 12/09/2021  4:25 PM      Failed - Cr in normal range and within 360 days    Creatinine  Date Value Ref Range Status  06/18/2014 1.09 0.60 - 1.30 mg/dL Final   Creatinine, Ser  Date Value Ref Range Status  10/13/2021 1.70 (H) 0.61 - 1.24 mg/dL Final         Passed - eGFR is 30 or above and within 360 days    EGFR (African American)  Date Value Ref Range Status  06/18/2014 >60 >43m/min Final   GFR calc Af Amer  Date Value Ref Range Status  05/25/2020 55 (L) >59 mL/min/1.73 Final    Comment:    **In accordance with recommendations from the NKF-ASN Task force,**   [facility] is in the process of updating its eGFR calculation to the   2021 CKD-EPI creatinine equation that estimates kidney function   without a race variable.    EGFR (Non-African  Wyvonnia Lora.)  Date Value Ref Range Status  06/18/2014 >60 >97m/min Final    Comment:    eGFR values <660mmin/1.73 m2 may be an indication of chronic kidney disease (CKD). Calculated eGFR, using the MRDR Study equation, is useful in  patients with stable renal function. The eGFR calculation will not be reliable in acutely ill patients when serum creatinine is changing rapidly. It is not useful in patients on dialysis. The eGFR calculation may not be applicable to patients at the low and high extremes of body sizes, pregnant women, and vegetarians.    GFR, Estimated  Date Value Ref Range Status  10/13/2021 40 (L) >60 mL/min Final    Comment:    (NOTE) Calculated using the CKD-EPI Creatinine Equation (2021)    eGFR  Date Value Ref Range Status  10/12/2021 42 (L) >59 mL/min/1.73 Final         Passed - Completed PHQ-2 or PHQ-9 in the last 360 days       Passed - Last BP in normal range    BP Readings from Last 1 Encounters:  12/01/21 123/65         Passed - Valid encounter within last 6 months    Recent Outpatient Visits           1 month ago Benign prostatic hyperplasia with nocturia   [place]   1 month ago Dizziness   [place]   2 months ago Subacute cough   [place]   3 months ago Urinary frequency   [place]   4 months ago Benign hypertensive renal disease   [facility] JoValerie RoysDO       Future Appointments             In 1 week JoWynetta EmeryMeBarb MerinoDO [place] [place] In 1 month Jo[place] Camino AngostoMeBarb MerinoDO [facility] In 11 months KoNehemiah MassedDaMonia SabalMD [place]

## 2021-12-10 NOTE — Telephone Encounter (Signed)
Requested Prescriptions  Pending Prescriptions Disp Refills  . dutasteride (AVODART) 0.5 MG capsule [Pharmacy Med Name: DUTASTERIDE 0.5 MG CAP] 30 capsule 1    Sig: TAKE ONE CAPSULE BY MOUTH DAILY AS DIRECTED     Urology: 5-alpha Reductase Inhibitors Failed - 12/09/2021  4:25 PM      Failed - PSA in normal range and within 360 days    Prostate Specific Ag, Serum  Date Value Ref Range Status  10/07/2016 1.5 0.0 - 4.0 ng/mL Final    Comment:    Roche ECLIA methodology. According to the American Urological Association, Serum PSA should decrease and remain at undetectable levels after radical prostatectomy. The AUA defines biochemical recurrence as an initial PSA value 0.2 ng/mL or greater followed by a subsequent confirmatory PSA value 0.2 ng/mL or greater. Values obtained with different assay methods or kits cannot be used interchangeably. Results cannot be interpreted as absolute evidence of the presence or absence of malignant disease.          Passed - Valid encounter within last 12 months    Recent Outpatient Visits          1 month ago Benign prostatic hyperplasia with nocturia   [place], [place], DO   1 month ago Dizziness   [place], [place], DO   2 months ago Subacute cough   [place], DO   3 months ago Urinary frequency   [place], [place], DO   4 months ago Benign hypertensive renal disease   [facility] [name], DO      Future Appointments            In 1 week Wynetta Emery, Barb Merino, DO [facility], [place]   In 1 month [place], Barb Merino, DO [facility], [place]   In 11 months Ralene Bathe, MD [place]           . DULoxetine (CYMBALTA) 20 MG capsule [Pharmacy Med Name: DULOXETINE HCL 20 MG CAP] 90 capsule 0    Sig: TAKE 1 CAPSULE BY MOUTH DAILY.     Psychiatry: Antidepressants - SNRI - duloxetine Failed -  12/09/2021  4:25 PM      Failed - Cr in normal range and within 360 days    Creatinine  Date Value Ref Range Status  06/18/2014 1.09 0.60 - 1.30 mg/dL Final   Creatinine, Ser  Date Value Ref Range Status  10/13/2021 1.70 (H) 0.61 - 1.24 mg/dL Final         Passed - eGFR is 30 or above and within 360 days    EGFR (African American)  Date Value Ref Range Status  06/18/2014 >60 >3m/min Final   GFR calc Af Amer  Date Value Ref Range Status  05/25/2020 55 (L) >59 mL/min/1.73 Final    Comment:    **In accordance with recommendations from the NKF-ASN Task force,**   [facility] is in the process of updating its eGFR calculation to the   2021 CKD-EPI creatinine equation that estimates kidney function   without a race variable.    EGFR (Non-African Amer.)  Date Value Ref Range Status  06/18/2014 >60 >640mmin Final    Comment:    eGFR values <6063min/1.73 m2 may be an indication of chronic kidney disease (CKD). Calculated eGFR, using the MRDR Study equation, is useful in  patients with stable renal function. The eGFR calculation will not be  reliable in acutely ill patients when serum creatinine is changing rapidly. It is not useful in patients on dialysis. The eGFR calculation may not be applicable to patients at the low and high extremes of body sizes, pregnant women, and vegetarians.    GFR, Estimated  Date Value Ref Range Status  10/13/2021 40 (L) >60 mL/min Final    Comment:    (NOTE) Calculated using the CKD-EPI Creatinine Equation (2021)    eGFR  Date Value Ref Range Status  10/12/2021 42 (L) >59 mL/min/1.73 Final         Passed - Completed PHQ-2 or PHQ-9 in the last 360 days      Passed - Last BP in normal range    BP Readings from Last 1 Encounters:  12/01/21 123/65         Passed - Valid encounter within last 6 months    Recent Outpatient Visits          1 month ago Benign prostatic hyperplasia with nocturia   [place], [place],  DO   1 month ago Dizziness   [place], [place], DO   2 months ago Subacute cough   [place], [place], DO   3 months ago Urinary frequency   [place], [place], DO   4 months ago Benign hypertensive renal disease   [facility] [name], DO      Future Appointments            In 1 week Wynetta Emery, Barb Merino, DO [facility], [place]   In 1 month [place], Barb Merino, DO [facility], [place]   In 11 months Nehemiah Massed, Monia Sabal, MD [place]

## 2021-12-15 ENCOUNTER — Inpatient Hospital Stay: Payer: Medicare Other

## 2021-12-15 ENCOUNTER — Inpatient Hospital Stay: Payer: Medicare Other | Attending: Oncology

## 2021-12-15 VITALS — BP 129/69 | HR 68

## 2021-12-15 DIAGNOSIS — D631 Anemia in chronic kidney disease: Secondary | ICD-10-CM

## 2021-12-15 DIAGNOSIS — M9903 Segmental and somatic dysfunction of lumbar region: Secondary | ICD-10-CM | POA: Diagnosis not present

## 2021-12-15 DIAGNOSIS — D473 Essential (hemorrhagic) thrombocythemia: Secondary | ICD-10-CM | POA: Diagnosis not present

## 2021-12-15 DIAGNOSIS — M5412 Radiculopathy, cervical region: Secondary | ICD-10-CM | POA: Diagnosis not present

## 2021-12-15 DIAGNOSIS — D7581 Myelofibrosis: Secondary | ICD-10-CM | POA: Insufficient documentation

## 2021-12-15 DIAGNOSIS — M9901 Segmental and somatic dysfunction of cervical region: Secondary | ICD-10-CM | POA: Diagnosis not present

## 2021-12-15 DIAGNOSIS — M5033 Other cervical disc degeneration, cervicothoracic region: Secondary | ICD-10-CM | POA: Diagnosis not present

## 2021-12-15 DIAGNOSIS — D649 Anemia, unspecified: Secondary | ICD-10-CM

## 2021-12-15 LAB — HEMOGLOBIN AND HEMATOCRIT, BLOOD
HCT: 30 % — ABNORMAL LOW (ref 39.0–52.0)
Hemoglobin: 9.4 g/dL — ABNORMAL LOW (ref 13.0–17.0)

## 2021-12-15 MED ORDER — EPOETIN ALFA-EPBX 40000 UNIT/ML IJ SOLN
40000.0000 [IU] | Freq: Once | INTRAMUSCULAR | Status: AC
  Administered 2021-12-15: 40000 [IU] via SUBCUTANEOUS
  Filled 2021-12-15: qty 1

## 2021-12-15 MED ORDER — EPOETIN ALFA-EPBX 20000 UNIT/ML IJ SOLN: 50000.0000 [IU] | INTRAMUSCULAR | Status: DC

## 2021-12-15 MED ORDER — EPOETIN ALFA-EPBX 10000 UNIT/ML IJ SOLN
10000.0000 [IU] | Freq: Once | INTRAMUSCULAR | Status: AC
  Administered 2021-12-15: 10000 [IU] via SUBCUTANEOUS
  Filled 2021-12-15: qty 1

## 2021-12-20 ENCOUNTER — Ambulatory Visit: Payer: Medicare Other | Admitting: Family Medicine

## 2021-12-21 ENCOUNTER — Other Ambulatory Visit: Payer: Self-pay | Admitting: Cardiovascular Disease

## 2021-12-22 ENCOUNTER — Inpatient Hospital Stay: Payer: Medicare Other

## 2021-12-22 VITALS — BP 131/68 | HR 72

## 2021-12-22 DIAGNOSIS — N183 Chronic kidney disease, stage 3 unspecified: Secondary | ICD-10-CM

## 2021-12-22 DIAGNOSIS — M25561 Pain in right knee: Secondary | ICD-10-CM | POA: Diagnosis not present

## 2021-12-22 DIAGNOSIS — D7581 Myelofibrosis: Secondary | ICD-10-CM

## 2021-12-22 DIAGNOSIS — D649 Anemia, unspecified: Secondary | ICD-10-CM

## 2021-12-22 DIAGNOSIS — M25562 Pain in left knee: Secondary | ICD-10-CM | POA: Diagnosis not present

## 2021-12-22 DIAGNOSIS — D473 Essential (hemorrhagic) thrombocythemia: Secondary | ICD-10-CM | POA: Diagnosis not present

## 2021-12-22 DIAGNOSIS — M170 Bilateral primary osteoarthritis of knee: Secondary | ICD-10-CM | POA: Diagnosis not present

## 2021-12-22 LAB — HEMOGLOBIN AND HEMATOCRIT, BLOOD
HCT: 30.1 % — ABNORMAL LOW (ref 39.0–52.0)
Hemoglobin: 9.3 g/dL — ABNORMAL LOW (ref 13.0–17.0)

## 2021-12-22 MED ORDER — EPOETIN ALFA-EPBX 40000 UNIT/ML IJ SOLN
40000.0000 [IU] | Freq: Once | INTRAMUSCULAR | Status: AC
  Administered 2021-12-22: 40000 [IU] via SUBCUTANEOUS
  Filled 2021-12-22: qty 1

## 2021-12-22 MED ORDER — EPOETIN ALFA-EPBX 20000 UNIT/ML IJ SOLN: 50000.0000 [IU] | INTRAMUSCULAR | Status: DC

## 2021-12-22 MED ORDER — EPOETIN ALFA-EPBX 10000 UNIT/ML IJ SOLN
10000.0000 [IU] | Freq: Once | INTRAMUSCULAR | Status: AC
  Administered 2021-12-22: 10000 [IU] via SUBCUTANEOUS
  Filled 2021-12-22: qty 1

## 2021-12-29 ENCOUNTER — Inpatient Hospital Stay: Payer: Medicare Other

## 2021-12-29 DIAGNOSIS — D473 Essential (hemorrhagic) thrombocythemia: Secondary | ICD-10-CM | POA: Diagnosis not present

## 2021-12-29 DIAGNOSIS — D7581 Myelofibrosis: Secondary | ICD-10-CM | POA: Diagnosis not present

## 2021-12-29 DIAGNOSIS — D649 Anemia, unspecified: Secondary | ICD-10-CM

## 2021-12-29 DIAGNOSIS — N183 Chronic kidney disease, stage 3 unspecified: Secondary | ICD-10-CM

## 2021-12-29 LAB — HEMOGLOBIN AND HEMATOCRIT, BLOOD
HCT: 31.7 % — ABNORMAL LOW (ref 39.0–52.0)
Hemoglobin: 10 g/dL — ABNORMAL LOW (ref 13.0–17.0)

## 2021-12-30 ENCOUNTER — Telehealth: Payer: Self-pay

## 2021-12-30 ENCOUNTER — Ambulatory Visit: Payer: Self-pay

## 2021-12-30 NOTE — Telephone Encounter (Signed)
  Chief Complaint: dizziness Symptoms: dizziness when bending over or standing up too fast, feeling like the room spinning, or passing out feeling, balance issues Frequency: 1 month Pertinent Negatives: Patient denies fainting Disposition: '[]'$ ED /'[]'$ Urgent Care (no appt availability in office) / '[x]'$ Appointment(In office/virtual)/ '[]'$  [facility]/ '[]'$ Home Care/ '[]'$ Refused Recommended Disposition /'[]'$ [facility]/ '[]'$  Follow-up with PCP Additional Notes: pt called wife today to tell her about the dizziness and when bending over. She denies that pt has ever fainted. Pt taking the meclizine but not helping and they monitor pt's BP which has been running good. She states that he has issues with his balance at times too and stumbles and almost falls. Offered appt with Henrine Screws, NP for tomorrow but wife states pt would like to see Dr. Wynetta Emery only since she knows his history. Scheduled for first avialble appt on 01/05/22 at 1040 but for telephone visit. She is wanting to see if appt can be changed to in person. Tried calling [facility] # but no answer. Carole Binning, CMA a teams message and she states she would call wife pt. Advised Mrs. Sabia of this. She verbalized understanding.    Reason for Disposition  [1] MODERATE dizziness (e.g., interferes with normal activities) AND [2] has been evaluated by doctor (or NP/PA) for this  Answer Assessment - Initial Assessment Questions 1. DESCRIPTION: "Describe your dizziness."     When bending down gets dizziness, getting weak and stumbles at times 2. LIGHTHEADED: "Do you feel lightheaded?" (e.g., somewhat faint, woozy, weak upon standing)     Feeling like going to pass out at time  3. VERTIGO: "Do you feel like either you or the room is spinning or tilting?" (i.e. vertigo)     yes 4. SEVERITY: "How bad is it?"  "Do you feel like you are going to faint?" "Can you stand and walk?"   - MILD: Feels slightly dizzy, but walking normally.   - MODERATE: Feels  unsteady when walking, but not falling; interferes with normal activities (e.g., school, work).   - SEVERE: Unable to walk without falling, or requires assistance to walk without falling; feels like passing out now.      moderate 5. ONSET:  "When did the dizziness begin?"     1 month 6. AGGRAVATING FACTORS: "Does anything make it worse?" (e.g., standing, change in head position)     Change in position 10. OTHER SYMPTOMS: "Do you have any other symptoms?" (e.g., fever, chest pain, vomiting, diarrhea, bleeding)       Has had previous falls d/t balance issues  Protocols used: Dizziness - Lightheadedness-A-AH

## 2021-12-30 NOTE — Telephone Encounter (Signed)
See other TE.

## 2021-12-30 NOTE — Telephone Encounter (Signed)
Attempted to contact patient to try and change his appointment for today at 4:20 instead of next Wednesday over telephone. NA LVM for patient to call back.

## 2021-12-30 NOTE — Telephone Encounter (Signed)
Called patient back, patient states he is busy today and going out of town later. States he will keep his appointment for Wednesday for dizziness.

## 2022-01-05 ENCOUNTER — Encounter: Payer: Self-pay | Admitting: Family Medicine

## 2022-01-05 ENCOUNTER — Inpatient Hospital Stay: Payer: Medicare Other

## 2022-01-05 ENCOUNTER — Ambulatory Visit (INDEPENDENT_AMBULATORY_CARE_PROVIDER_SITE_OTHER): Payer: Medicare Other | Admitting: Family Medicine

## 2022-01-05 ENCOUNTER — Other Ambulatory Visit: Payer: Self-pay | Admitting: Family Medicine

## 2022-01-05 ENCOUNTER — Inpatient Hospital Stay: Payer: Medicare Other | Attending: Oncology

## 2022-01-05 VITALS — BP 160/67 | Temp 97.9°F | Wt 188.3 lb

## 2022-01-05 VITALS — BP 140/75 | HR 62

## 2022-01-05 DIAGNOSIS — D7581 Myelofibrosis: Secondary | ICD-10-CM | POA: Insufficient documentation

## 2022-01-05 DIAGNOSIS — R42 Dizziness and giddiness: Secondary | ICD-10-CM | POA: Diagnosis not present

## 2022-01-05 DIAGNOSIS — D473 Essential (hemorrhagic) thrombocythemia: Secondary | ICD-10-CM | POA: Diagnosis not present

## 2022-01-05 DIAGNOSIS — D631 Anemia in chronic kidney disease: Secondary | ICD-10-CM

## 2022-01-05 DIAGNOSIS — I639 Cerebral infarction, unspecified: Secondary | ICD-10-CM | POA: Diagnosis not present

## 2022-01-05 DIAGNOSIS — D649 Anemia, unspecified: Secondary | ICD-10-CM

## 2022-01-05 DIAGNOSIS — M5033 Other cervical disc degeneration, cervicothoracic region: Secondary | ICD-10-CM | POA: Diagnosis not present

## 2022-01-05 DIAGNOSIS — M9903 Segmental and somatic dysfunction of lumbar region: Secondary | ICD-10-CM | POA: Diagnosis not present

## 2022-01-05 DIAGNOSIS — M9901 Segmental and somatic dysfunction of cervical region: Secondary | ICD-10-CM | POA: Diagnosis not present

## 2022-01-05 DIAGNOSIS — M5412 Radiculopathy, cervical region: Secondary | ICD-10-CM | POA: Diagnosis not present

## 2022-01-05 LAB — HEMOGLOBIN AND HEMATOCRIT, BLOOD
HCT: 32.5 % — ABNORMAL LOW (ref 39.0–52.0)
Hemoglobin: 9.8 g/dL — ABNORMAL LOW (ref 13.0–17.0)

## 2022-01-05 MED ORDER — EPOETIN ALFA 40000 UNIT/ML IJ SOLN
40000.0000 [IU] | Freq: Once | INTRAMUSCULAR | Status: AC
  Administered 2022-01-05: 40000 [IU] via SUBCUTANEOUS
  Filled 2022-01-05: qty 1

## 2022-01-05 MED ORDER — EPOETIN ALFA 20000 UNIT/ML IJ SOLN: 50000.0000 [IU] | Freq: Once | INTRAMUSCULAR | Status: DC

## 2022-01-05 MED ORDER — MECLIZINE HCL 25 MG PO TABS: ORAL_TABLET | ORAL | 6 refills | Status: DC

## 2022-01-05 MED ORDER — EPOETIN ALFA 10000 UNIT/ML IJ SOLN
10000.0000 [IU] | INTRAMUSCULAR | Status: DC
  Administered 2022-01-05: 10000 [IU] via INTRAVENOUS
  Filled 2022-01-05: qty 1

## 2022-01-05 NOTE — Progress Notes (Signed)
BP (!) 160/67   Temp 97.9 F (36.6 C)   Wt 188 lb 4.8 oz (85.4 kg)   SpO2 97%   BMI 27.02 kg/m    Subjective:    Patient ID: [name], male    DOB: April 07, 1941, 81 y.o.   MRN: [id number]  HPI: [name] is a 81 y.o. male  Chief Complaint  Patient presents with   Dizziness    Patient states he has been feeling dizzy off and on for about a week, fell 2 nights ago when going to the bathroom. Patient states he does not have any SOB or chest pain, just feels woozy.   DIZZINESS Duration: about a week Description of symptoms: lightheaded Duration of episode: couple of minutes Dizziness frequency: recurrent Provoking factors: bending over, doing things Triggered by rolling over in bed: yes Triggered by bending over: yes Aggravated by head movement: no Aggravated by exertion, coughing, loud noises: no Recent head injury: no Recent or current viral symptoms: no History of vasovagal episodes: no Nausea: no Vomiting: no Tinnitus: no Hearing loss: yes Aural fullness: yes Headache: no Photophobia/phonophobia: no Unsteady gait: yes Postural instability: no Diplopia, dysarthria, dysphagia or weakness: no Related to exertion: no Pallor: yes Diaphoresis: no Dyspnea: no Chest pain: no  Relevant past medical, surgical, family and social history reviewed and updated as indicated. Interim medical history since our last visit reviewed. Allergies and medications reviewed and updated.  Review of Systems  Constitutional:  Positive for fatigue. Negative for activity change, appetite change, chills, diaphoresis, fever and unexpected weight change.  HENT: Negative.    Respiratory: Negative.    Cardiovascular: Negative.   Gastrointestinal: Negative.   Musculoskeletal: Negative.   Neurological:  Positive for dizziness, weakness and light-headedness. Negative for tremors, seizures, syncope, facial asymmetry, speech difficulty, numbness and headaches.  Psychiatric/Behavioral:  Negative.      Per HPI unless specifically indicated above     Objective:    BP (!) 160/67   Temp 97.9 F (36.6 C)   Wt 188 lb 4.8 oz (85.4 kg)   SpO2 97%   BMI 27.02 kg/m   Wt Readings from Last 3 Encounters:  01/05/22 188 lb 4.8 oz (85.4 kg)  11/15/21 188 lb 4 oz (85.4 kg)  11/02/21 187 lb 9.6 oz (85.1 kg)    Physical Exam Vitals and nursing note reviewed.  Constitutional:      General: He is not in acute distress.    Appearance: Normal appearance. He is not ill-appearing, toxic-appearing or diaphoretic.  HENT:     Head: Normocephalic and atraumatic.     Right Ear: External ear normal.     Left Ear: External ear normal.     Nose: Nose normal.     Mouth/Throat:     Mouth: Mucous membranes are moist.     Pharynx: Oropharynx is clear.  Eyes:     General: No scleral icterus.       Right eye: No discharge.        Left eye: No discharge.     Extraocular Movements: Extraocular movements intact.     Conjunctiva/sclera: Conjunctivae normal.     Pupils: Pupils are equal, round, and reactive to light.  Cardiovascular:     Rate and Rhythm: Normal rate and regular rhythm.     Pulses: Normal pulses.     Heart sounds: Normal heart sounds. No murmur heard.    No friction rub. No gallop.  Pulmonary:     Effort: Pulmonary effort  is normal. No respiratory distress.     Breath sounds: Normal breath sounds. No stridor. No wheezing, rhonchi or rales.  Chest:     Chest wall: No tenderness.  Musculoskeletal:        General: Normal range of motion.     Cervical back: Normal range of motion and neck supple.  Skin:    General: Skin is warm and dry.     Capillary Refill: Capillary refill takes less than 2 seconds.     Coloration: Skin is not jaundiced or pale.     Findings: No bruising, erythema, lesion or rash.  Neurological:     General: No focal deficit present.     Mental Status: He is alert and oriented to person, place, and time. Mental status is at baseline.  Psychiatric:         Mood and Affect: Mood normal.        Behavior: Behavior normal.        Thought Content: Thought content normal.        Judgment: Judgment normal.     Results for orders placed or performed in visit on 01/05/22  Hemoglobin and Hematocrit, Blood  Result Value Ref Range   Hemoglobin 9.8 (L) 13.0 - 17.0 g/dL   HCT 32.5 (L) 39.0 - 52.0 %      Assessment & Plan:   Problem List Items Addressed This Visit       Other   Dizziness - Primary    Chronic. Likely due to heat and anemia. Encouraged taking it easy and increasing fluids. Meclizine given. Follow up 2-4 weeks.        Follow up plan: Return 2-4 weeks.

## 2022-01-05 NOTE — Assessment & Plan Note (Signed)
Chronic. Likely due to heat and anemia. Encouraged taking it easy and increasing fluids. Meclizine given. Follow up 2-4 weeks.

## 2022-01-06 NOTE — Telephone Encounter (Signed)
Has appt. For 01/20/2022  Requested Prescriptions  Pending Prescriptions Disp Refills  . clopidogrel (PLAVIX) 75 MG tablet [Pharmacy Med Name: CLOPIDOGREL BISULFATE 75 MG TAB] 90 tablet 1    Sig: TAKE 1 TABLET BY MOUTH DAILY     Hematology: Antiplatelets - clopidogrel Failed - 01/05/2022  5:37 PM      Failed - HCT in normal range and within 180 days    HCT  Date Value Ref Range Status  01/05/2022 32.5 (L) 39.0 - 52.0 % Final    Comment:    Performed at [facility], [address] [place], [place] [address]   Hematocrit  Date Value Ref Range Status  10/12/2021 32.7 (L) 37.5 - 51.0 % Final         Failed - HGB in normal range and within 180 days    Hemoglobin  Date Value Ref Range Status  01/05/2022 9.8 (L) 13.0 - 17.0 g/dL Final  10/12/2021 10.3 (L) 13.0 - 17.7 g/dL Final         Failed - Cr in normal range and within 360 days    Creatinine  Date Value Ref Range Status  06/18/2014 1.09 0.60 - 1.30 mg/dL Final   Creatinine, Ser  Date Value Ref Range Status  10/13/2021 1.70 (H) 0.61 - 1.24 mg/dL Final         Passed - PLT in normal range and within 180 days    Platelets  Date Value Ref Range Status  10/13/2021 281 150 - 400 K/uL Final  10/12/2021 321 150 - 450 x10E3/uL Final         Passed - Valid encounter within last 6 months    Recent Outpatient Visits          Yesterday Dizziness   [facility] [place], Megan P, DO   2 months ago Benign prostatic hyperplasia with nocturia   [place], [place], DO   2 months ago Dizziness   [place], [place], DO   3 months ago Subacute cough   [place], [place], DO   4 months ago Urinary frequency   [place], [place], DO      Future Appointments            In 2 weeks Wynetta Emery, Barb Merino, DO [facility], [place]   In 1 month [place], Barb Merino, DO [facility], [place]   In 10 months Ralene Bathe, MD [place]

## 2022-01-07 ENCOUNTER — Other Ambulatory Visit: Payer: Self-pay | Admitting: Family Medicine

## 2022-01-07 NOTE — Telephone Encounter (Signed)
Duplicate request on Plavix, Dustasterid not on current med list. Requested Prescriptions  Pending Prescriptions Disp Refills  . dutasteride (AVODART) 0.5 MG capsule [Pharmacy Med Name: DUTASTERIDE 0.5 MG CAP] 30 capsule 1    Sig: TAKE ONE CAPSULE BY MOUTH DAILY AS DIRECTED     Urology: 5-alpha Reductase Inhibitors Failed - 01/07/2022 10:24 AM      Failed - PSA in normal range and within 360 days    Prostate Specific Ag, Serum  Date Value Ref Range Status  10/07/2016 1.5 0.0 - 4.0 ng/mL Final    Comment:    Roche ECLIA methodology. According to the American Urological Association, Serum PSA should decrease and remain at undetectable levels after radical prostatectomy. The AUA defines biochemical recurrence as an initial PSA value 0.2 ng/mL or greater followed by a subsequent confirmatory PSA value 0.2 ng/mL or greater. Values obtained with different assay methods or kits cannot be used interchangeably. Results cannot be interpreted as absolute evidence of the presence or absence of malignant disease.          Passed - Valid encounter within last 12 months    Recent Outpatient Visits          2 days ago Dizziness   [place], Megan P, DO   2 months ago Benign prostatic hyperplasia with nocturia   [place], [place], DO   2 months ago Dizziness   [place], [place], DO   3 months ago Subacute cough   [place] P, DO   4 months ago Urinary frequency   [place], Barb Merino, DO      Future Appointments            In 1 week Wynetta Emery, Barb Merino, DO [facility], [place]   In 1 month [place], Barb Merino, DO [facility], [place]   In 10 months Ralene Bathe, MD [place]           . clopidogrel (PLAVIX) 75 MG tablet [Pharmacy Med Name: CLOPIDOGREL BISULFATE 75 MG TAB] 90 tablet 0    Sig: TAKE 1 TABLET BY MOUTH DAILY     Hematology:  Antiplatelets - clopidogrel Failed - 01/07/2022 10:24 AM      Failed - HCT in normal range and within 180 days    HCT  Date Value Ref Range Status  01/05/2022 32.5 (L) 39.0 - 52.0 % Final    Comment:    Performed at [facility], [address] [place], [place] [address]   Hematocrit  Date Value Ref Range Status  10/12/2021 32.7 (L) 37.5 - 51.0 % Final         Failed - HGB in normal range and within 180 days    Hemoglobin  Date Value Ref Range Status  01/05/2022 9.8 (L) 13.0 - 17.0 g/dL Final  10/12/2021 10.3 (L) 13.0 - 17.7 g/dL Final         Failed - Cr in normal range and within 360 days    Creatinine  Date Value Ref Range Status  06/18/2014 1.09 0.60 - 1.30 mg/dL Final   Creatinine, Ser  Date Value Ref Range Status  10/13/2021 1.70 (H) 0.61 - 1.24 mg/dL Final         Passed - PLT in normal range and within 180 days    Platelets  Date Value Ref Range Status  10/13/2021 281 150 - 400 K/uL Final  10/12/2021 321 150 - 450 x10E3/uL Final         Passed - Valid encounter within last 6 months    Recent Outpatient Visits          2 days ago Dizziness   [place], DO   2 months ago Benign prostatic hyperplasia with nocturia   [place], [place], DO   2 months ago Dizziness   [place], [place], DO   3 months ago Subacute cough   [place] P, DO   4 months ago Urinary frequency   [place], Barb Merino, DO      Future Appointments            In 1 week Wynetta Emery, Barb Merino, DO [facility], [place]   In 1 month [place], Barb Merino, DO [facility], [place]   In 10 months Ralene Bathe, MD [place]

## 2022-01-11 ENCOUNTER — Other Ambulatory Visit: Payer: Self-pay | Admitting: Family Medicine

## 2022-01-11 ENCOUNTER — Ambulatory Visit: Payer: Medicare Other | Admitting: Family Medicine

## 2022-01-11 MED ORDER — GABAPENTIN 300 MG PO CAPS: ORAL_CAPSULE | ORAL | 0 refills | Status: DC

## 2022-01-12 ENCOUNTER — Other Ambulatory Visit: Payer: Self-pay

## 2022-01-12 ENCOUNTER — Inpatient Hospital Stay: Payer: Medicare Other

## 2022-01-12 DIAGNOSIS — D649 Anemia, unspecified: Secondary | ICD-10-CM

## 2022-01-12 DIAGNOSIS — N183 Chronic kidney disease, stage 3 unspecified: Secondary | ICD-10-CM

## 2022-01-12 DIAGNOSIS — D473 Essential (hemorrhagic) thrombocythemia: Secondary | ICD-10-CM | POA: Diagnosis not present

## 2022-01-12 DIAGNOSIS — D7581 Myelofibrosis: Secondary | ICD-10-CM | POA: Diagnosis not present

## 2022-01-12 DIAGNOSIS — D630 Anemia in neoplastic disease: Secondary | ICD-10-CM

## 2022-01-12 DIAGNOSIS — D631 Anemia in chronic kidney disease: Secondary | ICD-10-CM

## 2022-01-12 LAB — COMPREHENSIVE METABOLIC PANEL
ALT: 21 U/L (ref 0–44)
AST: 26 U/L (ref 15–41)
Albumin: 3.8 g/dL (ref 3.5–5.0)
Alkaline Phosphatase: 81 U/L (ref 38–126)
Anion gap: 6 (ref 5–15)
BUN: 28 mg/dL — ABNORMAL HIGH (ref 8–23)
CO2: 24 mmol/L (ref 22–32)
Calcium: 8.7 mg/dL — ABNORMAL LOW (ref 8.9–10.3)
Chloride: 109 mmol/L (ref 98–111)
Creatinine, Ser: 1.99 mg/dL — ABNORMAL HIGH (ref 0.61–1.24)
GFR, Estimated: 33 mL/min — ABNORMAL LOW (ref >60)
Glucose, Bld: 112 mg/dL — ABNORMAL HIGH (ref 70–99)
Potassium: 4.4 mmol/L (ref 3.5–5.1)
Sodium: 139 mmol/L (ref 135–145)
Total Bilirubin: 0.8 mg/dL (ref 0.3–1.2)
Total Protein: 6.2 g/dL — ABNORMAL LOW (ref 6.5–8.1)

## 2022-01-12 LAB — CBC WITH DIFFERENTIAL/PLATELET
Abs Immature Granulocytes: 0.44 10*3/uL — ABNORMAL HIGH (ref 0.00–0.07)
Basophils Absolute: 0.1 10*3/uL (ref 0.0–0.1)
Basophils Relative: 1 %
Eosinophils Absolute: 0 10*3/uL (ref 0.0–0.5)
Eosinophils Relative: 1 %
HCT: 33.4 % — ABNORMAL LOW (ref 39.0–52.0)
Hemoglobin: 10.6 g/dL — ABNORMAL LOW (ref 13.0–17.0)
Immature Granulocytes: 12 %
Lymphocytes Relative: 21 %
Lymphs Abs: 0.7 10*3/uL (ref 0.7–4.0)
MCH: 29 pg (ref 26.0–34.0)
MCHC: 31.7 g/dL (ref 30.0–36.0)
MCV: 91.3 fL (ref 80.0–100.0)
Monocytes Absolute: 0.6 10*3/uL (ref 0.1–1.0)
Monocytes Relative: 17 %
Neutro Abs: 1.7 10*3/uL (ref 1.7–7.7)
Neutrophils Relative %: 48 %
Platelets: 233 10*3/uL (ref 150–400)
RBC: 3.66 MIL/uL — ABNORMAL LOW (ref 4.22–5.81)
RDW: 18.6 % — ABNORMAL HIGH (ref 11.5–15.5)
Smear Review: NORMAL
WBC: 3.6 10*3/uL — ABNORMAL LOW (ref 4.0–10.5)
nRBC: 5.9 % — ABNORMAL HIGH (ref 0.0–0.2)

## 2022-01-12 LAB — IRON AND TIBC
Iron: 42 ug/dL — ABNORMAL LOW (ref 45–182)
Saturation Ratios: 16 % — ABNORMAL LOW (ref 17.9–39.5)
TIBC: 259 ug/dL (ref 250–450)
UIBC: 217 ug/dL

## 2022-01-12 LAB — VITAMIN B12: Vitamin B-12: 1052 pg/mL — ABNORMAL HIGH (ref 180–914)

## 2022-01-12 LAB — LACTATE DEHYDROGENASE: LDH: 444 U/L — ABNORMAL HIGH (ref 98–192)

## 2022-01-12 LAB — FERRITIN: Ferritin: 124 ng/mL (ref 24–336)

## 2022-01-19 ENCOUNTER — Inpatient Hospital Stay: Payer: Medicare Other

## 2022-01-19 DIAGNOSIS — D649 Anemia, unspecified: Secondary | ICD-10-CM

## 2022-01-19 DIAGNOSIS — D7581 Myelofibrosis: Secondary | ICD-10-CM

## 2022-01-19 DIAGNOSIS — D473 Essential (hemorrhagic) thrombocythemia: Secondary | ICD-10-CM | POA: Diagnosis not present

## 2022-01-19 DIAGNOSIS — D631 Anemia in chronic kidney disease: Secondary | ICD-10-CM

## 2022-01-19 LAB — HEMOGLOBIN AND HEMATOCRIT, BLOOD
HCT: 32.1 % — ABNORMAL LOW (ref 39.0–52.0)
Hemoglobin: 10.2 g/dL — ABNORMAL LOW (ref 13.0–17.0)

## 2022-01-20 ENCOUNTER — Encounter: Payer: Self-pay | Admitting: Family Medicine

## 2022-01-20 ENCOUNTER — Ambulatory Visit (INDEPENDENT_AMBULATORY_CARE_PROVIDER_SITE_OTHER): Payer: Medicare Other | Admitting: Family Medicine

## 2022-01-20 VITALS — BP 123/55 | HR 65 | Temp 98.2°F | Wt 186.8 lb

## 2022-01-20 DIAGNOSIS — E782 Mixed hyperlipidemia: Secondary | ICD-10-CM

## 2022-01-20 DIAGNOSIS — R42 Dizziness and giddiness: Secondary | ICD-10-CM

## 2022-01-20 DIAGNOSIS — I639 Cerebral infarction, unspecified: Secondary | ICD-10-CM | POA: Diagnosis not present

## 2022-01-20 DIAGNOSIS — R351 Nocturia: Secondary | ICD-10-CM

## 2022-01-20 DIAGNOSIS — I129 Hypertensive chronic kidney disease with stage 1 through stage 4 chronic kidney disease, or unspecified chronic kidney disease: Secondary | ICD-10-CM | POA: Diagnosis not present

## 2022-01-20 DIAGNOSIS — E538 Deficiency of other specified B group vitamins: Secondary | ICD-10-CM | POA: Diagnosis not present

## 2022-01-20 DIAGNOSIS — N401 Enlarged prostate with lower urinary tract symptoms: Secondary | ICD-10-CM | POA: Diagnosis not present

## 2022-01-20 DIAGNOSIS — R454 Irritability and anger: Secondary | ICD-10-CM | POA: Diagnosis not present

## 2022-01-20 DIAGNOSIS — D649 Anemia, unspecified: Secondary | ICD-10-CM | POA: Diagnosis not present

## 2022-01-20 MED ORDER — TAMSULOSIN HCL 0.4 MG PO CAPS: 0.8000 mg | ORAL_CAPSULE | Freq: Every day | ORAL | 1 refills | Status: DC

## 2022-01-20 MED ORDER — SIMVASTATIN 40 MG PO TABS: 40.0000 mg | ORAL_TABLET | Freq: Every day | ORAL | 1 refills | Status: DC

## 2022-01-20 MED ORDER — CLOPIDOGREL BISULFATE 75 MG PO TABS: 75.0000 mg | ORAL_TABLET | Freq: Every day | ORAL | 0 refills | Status: DC

## 2022-01-20 MED ORDER — OMEPRAZOLE 40 MG PO CPDR
40.0000 mg | DELAYED_RELEASE_CAPSULE | Freq: Every day | ORAL | 1 refills | Status: DC
Start: 2022-01-20 — End: 2022-08-15

## 2022-01-20 MED ORDER — GABAPENTIN 300 MG PO CAPS: ORAL_CAPSULE | ORAL | 1 refills | Status: DC

## 2022-01-20 MED ORDER — DULOXETINE HCL 20 MG PO CPEP
ORAL_CAPSULE | ORAL | 1 refills | Status: DC
Start: 2022-01-20 — End: 2022-09-13

## 2022-01-20 NOTE — Assessment & Plan Note (Signed)
Under good control on current regimen. Continue current regimen. Continue to monitor. Call with any concerns. Refills given.   

## 2022-01-20 NOTE — Assessment & Plan Note (Signed)
Doing better. Still happening occasionally. Would like to hold on PT at this time. Still likely multi-factorial. Continue to stress hydration and taking it easy in the heat.

## 2022-01-20 NOTE — Assessment & Plan Note (Signed)
Rechecking labs today. Await results. Treat as needed.  °

## 2022-01-20 NOTE — Progress Notes (Signed)
BP (!) 123/55   Pulse 65   Temp 98.2 F (36.8 C)   Wt 186 lb 12.8 oz (84.7 kg)   SpO2 96%   BMI 26.80 kg/m    Subjective:    Patient ID: [name], male    DOB: 1941/01/21, 81 y.o.   MRN: [id number]  HPI: [name] is a 81 y.o. male  Chief Complaint  Patient presents with   Dizziness    Patient here to follow up on dizziness, states he still feels a little uneasy on his feet.    Dizziness has been much better. Still getting a little off balance. Does not want to do any PT.   HYPERTENSION / HYPERLIPIDEMIA Satisfied with current treatment? yes Duration of hypertension: chronic BP monitoring frequency: not checking BP medication side effects: no Past BP meds: losartan, amlodipine Duration of hyperlipidemia: chronic Cholesterol medication side effects: no Cholesterol supplements: none Past cholesterol medications: simvastatin Medication compliance: excellent compliance Aspirin: no Recent stressors: no Recurrent headaches: no Visual changes: no Palpitations: no Dyspnea: no Chest pain: no Lower extremity edema: no Dizzy/lightheaded: no  BPH BPH status: better Satisfied with current treatment?: yes Medication side effects: no Medication compliance: excellent compliance Duration: chronic Nocturia: 1/night Urinary frequency:no Incomplete voiding: no Urgency: no Weak urinary stream: no Straining to start stream: no Dysuria: no Onset: gradual Severity: mild  IRRITABILITY Duration: chronic Status:controlled Anxious mood: yes  Excessive worrying: no Irritability: no  Sweating: no Nausea: no Palpitations:no Hyperventilation: no Panic attacks: no Agoraphobia: no  Obscessions/compulsions: no Depressed mood: no    01/05/2022   11:03 AM 11/02/2021   10:01 AM 10/12/2021    2:39 PM 09/15/2021   11:26 AM 07/22/2021    2:13 PM  Depression screen PHQ 2/9  Decreased Interest 0 0 0 0 0  Down, Depressed, Hopeless 0 0 0 0 0  PHQ - 2 Score 0 0 0 0 0   Altered sleeping 0 '1 1 1 '$ 0  Tired, decreased energy 1 0 1 1 0  Change in appetite 0 0 0 0 0  Feeling bad or failure about yourself  0 0 0 0 0  Trouble concentrating 0 0 0 0 0  Moving slowly or fidgety/restless 0 0 0 0 0  Suicidal thoughts 0 0 0 0 0  PHQ-9 Score '1 1 2 2 '$ 0  Difficult doing work/chores Not difficult at all Not difficult at all  Not difficult at all    Anhedonia: no Weight changes: no Insomnia: no   Hypersomnia: no Fatigue/loss of energy: no Feelings of worthlessness: no Feelings of guilt: no Impaired concentration/indecisiveness: no Suicidal ideations: no  Crying spells: no Recent Stressors/Life Changes: yes   Relationship problems: no   Family stress: yes     Financial stress: no    Job stress: no    Recent death/loss: no  Relevant past medical, surgical, family and social history reviewed and updated as indicated. Interim medical history since our last visit reviewed. Allergies and medications reviewed and updated.  Review of Systems  Constitutional: Negative.   Respiratory: Negative.    Cardiovascular: Negative.   Gastrointestinal: Negative.   Neurological:  Positive for dizziness, weakness and light-headedness. Negative for tremors, seizures, syncope, facial asymmetry, speech difficulty, numbness and headaches.  Psychiatric/Behavioral: Negative.      Per HPI unless specifically indicated above     Objective:    BP (!) 123/55   Pulse 65   Temp 98.2 F (36.8 C)   Wt 186  lb 12.8 oz (84.7 kg)   SpO2 96%   BMI 26.80 kg/m   Wt Readings from Last 3 Encounters:  01/20/22 186 lb 12.8 oz (84.7 kg)  01/05/22 188 lb 4.8 oz (85.4 kg)  11/15/21 188 lb 4 oz (85.4 kg)    Physical Exam Vitals and nursing note reviewed.  Constitutional:      General: He is not in acute distress.    Appearance: Normal appearance. He is not ill-appearing, toxic-appearing or diaphoretic.  HENT:     Head: Normocephalic and atraumatic.     Right Ear: External ear normal.      Left Ear: External ear normal.     Nose: Nose normal.     Mouth/Throat:     Mouth: Mucous membranes are moist.     Pharynx: Oropharynx is clear.  Eyes:     General: No scleral icterus.       Right eye: No discharge.        Left eye: No discharge.     Extraocular Movements: Extraocular movements intact.     Conjunctiva/sclera: Conjunctivae normal.     Pupils: Pupils are equal, round, and reactive to light.  Cardiovascular:     Rate and Rhythm: Normal rate and regular rhythm.     Pulses: Normal pulses.     Heart sounds: Normal heart sounds. No murmur heard.    No friction rub. No gallop.  Pulmonary:     Effort: Pulmonary effort is normal. No respiratory distress.     Breath sounds: Normal breath sounds. No stridor. No wheezing, rhonchi or rales.  Chest:     Chest wall: No tenderness.  Musculoskeletal:        General: Normal range of motion.     Cervical back: Normal range of motion and neck supple.  Skin:    General: Skin is warm and dry.     Capillary Refill: Capillary refill takes less than 2 seconds.     Coloration: Skin is not jaundiced or pale.     Findings: No bruising, erythema, lesion or rash.  Neurological:     General: No focal deficit present.     Mental Status: He is alert and oriented to person, place, and time. Mental status is at baseline.  Psychiatric:        Mood and Affect: Mood normal.        Behavior: Behavior normal.        Thought Content: Thought content normal.        Judgment: Judgment normal.     Results for orders placed or performed in visit on 01/19/22  Hemoglobin and Hematocrit, Blood  Result Value Ref Range   Hemoglobin 10.2 (L) 13.0 - 17.0 g/dL   HCT 32.1 (L) 39.0 - 52.0 %      Assessment & Plan:   Problem List Items Addressed This Visit       Genitourinary   BPH (benign prostatic hyperplasia)    Under good control on current regimen. Continue current regimen. Continue to monitor. Call with any concerns. Refills given. Labs  drawn today.        Relevant Medications   tamsulosin (FLOMAX) 0.4 MG CAPS capsule   Other Relevant Orders   Comprehensive metabolic panel   Benign hypertensive renal disease    Under good control on current regimen. Continue current regimen. Continue to monitor. Call with any concerns. Refills given. Labs drawn today.       Relevant Orders   Comprehensive metabolic panel  Other   Dizziness - Primary    Doing better. Still happening occasionally. Would like to hold on PT at this time. Still likely multi-factorial. Continue to stress hydration and taking it easy in the heat.       Relevant Orders   Comprehensive metabolic panel   Mixed hyperlipidemia    Under good control on current regimen. Continue current regimen. Continue to monitor. Call with any concerns. Refills given. Labs drawn today.       Relevant Medications   simvastatin (ZOCOR) 40 MG tablet   Other Relevant Orders   Comprehensive metabolic panel   Lipid Panel w/o Chol/HDL Ratio   Anemia    Continue to follow with hematology. Rechecking labs today. Await results. Treat as needed.       Relevant Orders   Comprehensive metabolic panel   CBC with Differential/Platelet   Irritability    Under good control on current regimen. Continue current regimen. Continue to monitor. Call with any concerns. Refills given.        Relevant Orders   Comprehensive metabolic panel   Low vitamin B12 level    Rechecking labs today. Await results. Treat as needed.       Relevant Orders   Comprehensive metabolic panel   CBC with Differential/Platelet   B12     Follow up plan: Return in about 6 months (around 07/23/2022).

## 2022-01-20 NOTE — Assessment & Plan Note (Signed)
Under good control on current regimen. Continue current regimen. Continue to monitor. Call with any concerns. Refills given. Labs drawn today.   

## 2022-01-20 NOTE — Assessment & Plan Note (Signed)
Continue to follow with hematology. Rechecking labs today. Await results. Treat as needed.

## 2022-01-21 LAB — COMPREHENSIVE METABOLIC PANEL
ALT: 14 IU/L (ref 0–44)
AST: 19 IU/L (ref 0–40)
Albumin/Globulin Ratio: 2.6 — ABNORMAL HIGH (ref 1.2–2.2)
Albumin: 3.9 g/dL (ref 3.7–4.7)
Alkaline Phosphatase: 86 IU/L (ref 44–121)
BUN/Creatinine Ratio: 19 (ref 10–24)
BUN: 40 mg/dL — ABNORMAL HIGH (ref 8–27)
Bilirubin Total: 0.6 mg/dL (ref 0.0–1.2)
CO2: 18 mmol/L — ABNORMAL LOW (ref 20–29)
Calcium: 8.4 mg/dL — ABNORMAL LOW (ref 8.6–10.2)
Chloride: 110 mmol/L — ABNORMAL HIGH (ref 96–106)
Creatinine, Ser: 2.1 mg/dL — ABNORMAL HIGH (ref 0.76–1.27)
Globulin, Total: 1.5 g/dL (ref 1.5–4.5)
Glucose: 118 mg/dL — ABNORMAL HIGH (ref 70–99)
Potassium: 5.6 mmol/L — ABNORMAL HIGH (ref 3.5–5.2)
Sodium: 143 mmol/L (ref 134–144)
Total Protein: 5.4 g/dL — ABNORMAL LOW (ref 6.0–8.5)
eGFR: 31 mL/min/{1.73_m2} — ABNORMAL LOW (ref >59)

## 2022-01-21 LAB — LIPID PANEL W/O CHOL/HDL RATIO
Cholesterol, Total: 140 mg/dL (ref 100–199)
HDL: 31 mg/dL — ABNORMAL LOW (ref >39)
LDL Chol Calc (NIH): 85 mg/dL (ref 0–99)
Triglycerides: 133 mg/dL (ref 0–149)
VLDL Cholesterol Cal: 24 mg/dL (ref 5–40)

## 2022-01-21 LAB — CBC WITH DIFFERENTIAL/PLATELET
Basophils Absolute: 0 10*3/uL (ref 0.0–0.2)
Basos: 0 %
EOS (ABSOLUTE): 0 10*3/uL (ref 0.0–0.4)
Eos: 1 %
Hematocrit: 30.7 % — ABNORMAL LOW (ref 37.5–51.0)
Hemoglobin: 9.8 g/dL — ABNORMAL LOW (ref 13.0–17.7)
Lymphocytes Absolute: 0.6 10*3/uL — ABNORMAL LOW (ref 0.7–3.1)
Lymphs: 25 %
MCH: 27.9 pg (ref 26.6–33.0)
MCHC: 31.9 g/dL (ref 31.5–35.7)
MCV: 88 fL (ref 79–97)
Monocytes Absolute: 0.1 10*3/uL (ref 0.1–0.9)
Monocytes: 5 %
NRBC: 1 % — ABNORMAL HIGH (ref 0–0)
Neutrophils Absolute: 1.5 10*3/uL (ref 1.4–7.0)
Neutrophils: 59 %
Platelets: 196 10*3/uL (ref 150–450)
RBC: 3.51 x10E6/uL — ABNORMAL LOW (ref 4.14–5.80)
RDW: 17.1 % — ABNORMAL HIGH (ref 11.6–15.4)
WBC: 2.5 10*3/uL — CL (ref 3.4–10.8)

## 2022-01-21 LAB — IMMATURE CELLS
MYELOCYTES: 3 % — ABNORMAL HIGH (ref 0–0)
Metamyelocytes: 7 % — ABNORMAL HIGH (ref 0–0)

## 2022-01-21 LAB — VITAMIN B12: Vitamin B-12: 1013 pg/mL (ref 232–1245)

## 2022-01-25 ENCOUNTER — Other Ambulatory Visit: Payer: Self-pay | Admitting: *Deleted

## 2022-01-25 DIAGNOSIS — D7581 Myelofibrosis: Secondary | ICD-10-CM

## 2022-01-25 DIAGNOSIS — N183 Chronic kidney disease, stage 3 unspecified: Secondary | ICD-10-CM

## 2022-01-25 DIAGNOSIS — E538 Deficiency of other specified B group vitamins: Secondary | ICD-10-CM

## 2022-01-26 ENCOUNTER — Other Ambulatory Visit: Payer: Self-pay | Admitting: Family Medicine

## 2022-01-26 ENCOUNTER — Encounter: Payer: Self-pay | Admitting: Medical Oncology

## 2022-01-26 ENCOUNTER — Inpatient Hospital Stay (HOSPITAL_BASED_OUTPATIENT_CLINIC_OR_DEPARTMENT_OTHER): Payer: Medicare Other | Admitting: Medical Oncology

## 2022-01-26 ENCOUNTER — Inpatient Hospital Stay: Payer: Medicare Other

## 2022-01-26 VITALS — BP 130/73 | HR 70 | Temp 97.0°F | Wt 186.0 lb

## 2022-01-26 DIAGNOSIS — E538 Deficiency of other specified B group vitamins: Secondary | ICD-10-CM

## 2022-01-26 DIAGNOSIS — N183 Chronic kidney disease, stage 3 unspecified: Secondary | ICD-10-CM | POA: Diagnosis not present

## 2022-01-26 DIAGNOSIS — N289 Disorder of kidney and ureter, unspecified: Secondary | ICD-10-CM

## 2022-01-26 DIAGNOSIS — D649 Anemia, unspecified: Secondary | ICD-10-CM | POA: Diagnosis not present

## 2022-01-26 DIAGNOSIS — D7581 Myelofibrosis: Secondary | ICD-10-CM

## 2022-01-26 DIAGNOSIS — D473 Essential (hemorrhagic) thrombocythemia: Secondary | ICD-10-CM | POA: Diagnosis not present

## 2022-01-26 DIAGNOSIS — D631 Anemia in chronic kidney disease: Secondary | ICD-10-CM

## 2022-01-26 DIAGNOSIS — M5033 Other cervical disc degeneration, cervicothoracic region: Secondary | ICD-10-CM | POA: Diagnosis not present

## 2022-01-26 DIAGNOSIS — Z79899 Other long term (current) drug therapy: Secondary | ICD-10-CM | POA: Diagnosis not present

## 2022-01-26 DIAGNOSIS — M9901 Segmental and somatic dysfunction of cervical region: Secondary | ICD-10-CM | POA: Diagnosis not present

## 2022-01-26 DIAGNOSIS — M5412 Radiculopathy, cervical region: Secondary | ICD-10-CM | POA: Diagnosis not present

## 2022-01-26 DIAGNOSIS — M9903 Segmental and somatic dysfunction of lumbar region: Secondary | ICD-10-CM | POA: Diagnosis not present

## 2022-01-26 LAB — CBC WITH DIFFERENTIAL/PLATELET
Abs Immature Granulocytes: 0.4 10*3/uL — ABNORMAL HIGH (ref 0.00–0.07)
Basophils Absolute: 0 10*3/uL (ref 0.0–0.1)
Basophils Relative: 1 %
Eosinophils Absolute: 0 10*3/uL (ref 0.0–0.5)
Eosinophils Relative: 1 %
HCT: 30.5 % — ABNORMAL LOW (ref 39.0–52.0)
Hemoglobin: 9.8 g/dL — ABNORMAL LOW (ref 13.0–17.0)
Immature Granulocytes: 13 %
Lymphocytes Relative: 21 %
Lymphs Abs: 0.7 10*3/uL (ref 0.7–4.0)
MCH: 29 pg (ref 26.0–34.0)
MCHC: 32.1 g/dL (ref 30.0–36.0)
MCV: 90.2 fL (ref 80.0–100.0)
Monocytes Absolute: 0.6 10*3/uL (ref 0.1–1.0)
Monocytes Relative: 18 %
Neutro Abs: 1.4 10*3/uL — ABNORMAL LOW (ref 1.7–7.7)
Neutrophils Relative %: 46 %
Platelets: 168 10*3/uL (ref 150–400)
RBC: 3.38 MIL/uL — ABNORMAL LOW (ref 4.22–5.81)
RDW: 17.8 % — ABNORMAL HIGH (ref 11.5–15.5)
Smear Review: NORMAL
WBC: 3 10*3/uL — ABNORMAL LOW (ref 4.0–10.5)
nRBC: 1 % — ABNORMAL HIGH (ref 0.0–0.2)

## 2022-01-26 LAB — COMPREHENSIVE METABOLIC PANEL
ALT: 20 U/L (ref 0–44)
AST: 23 U/L (ref 15–41)
Albumin: 3.7 g/dL (ref 3.5–5.0)
Alkaline Phosphatase: 78 U/L (ref 38–126)
Anion gap: 8 (ref 5–15)
BUN: 37 mg/dL — ABNORMAL HIGH (ref 8–23)
CO2: 20 mmol/L — ABNORMAL LOW (ref 22–32)
Calcium: 8.4 mg/dL — ABNORMAL LOW (ref 8.9–10.3)
Chloride: 111 mmol/L (ref 98–111)
Creatinine, Ser: 1.77 mg/dL — ABNORMAL HIGH (ref 0.61–1.24)
GFR, Estimated: 38 mL/min — ABNORMAL LOW (ref >60)
Glucose, Bld: 111 mg/dL — ABNORMAL HIGH (ref 70–99)
Potassium: 4.6 mmol/L (ref 3.5–5.1)
Sodium: 139 mmol/L (ref 135–145)
Total Bilirubin: 0.6 mg/dL (ref 0.3–1.2)
Total Protein: 6.1 g/dL — ABNORMAL LOW (ref 6.5–8.1)

## 2022-01-26 LAB — IRON AND TIBC
Iron: 66 ug/dL (ref 45–182)
Saturation Ratios: 25 % (ref 17.9–39.5)
TIBC: 267 ug/dL (ref 250–450)
UIBC: 201 ug/dL

## 2022-01-26 LAB — VITAMIN B12: Vitamin B-12: 965 pg/mL — ABNORMAL HIGH (ref 180–914)

## 2022-01-26 LAB — LACTATE DEHYDROGENASE: LDH: 384 U/L — ABNORMAL HIGH (ref 98–192)

## 2022-01-26 LAB — FERRITIN: Ferritin: 188 ng/mL (ref 24–336)

## 2022-01-26 MED ORDER — EPOETIN ALFA 40000 UNIT/ML IJ SOLN
10000.0000 [IU] | Freq: Once | INTRAMUSCULAR | Status: AC
  Administered 2022-01-26: 10000 [IU] via SUBCUTANEOUS
  Filled 2022-01-26: qty 1

## 2022-01-26 MED ORDER — EPOETIN ALFA 40000 UNIT/ML IJ SOLN
40000.0000 [IU] | Freq: Once | INTRAMUSCULAR | Status: AC
  Administered 2022-01-26: 40000 [IU] via SUBCUTANEOUS
  Filled 2022-01-26: qty 1

## 2022-01-26 MED ORDER — EPOETIN ALFA 20000 UNIT/ML IJ SOLN: 50000.0000 [IU] | Freq: Once | INTRAMUSCULAR | Status: DC

## 2022-01-26 NOTE — Progress Notes (Signed)
Hematology/Oncology Consult Note [facility]  [phone] [phone]  Patient Care Team: [name], DO as PCP - General (Family Medicine) Pieter Partridge, MD as Referring Physician (Hematology) Hayden Pedro, MD as Consulting Physician (Ophthalmology) Garvin Fila, MD as Consulting Physician (Neurology) Festus Aloe, MD as Consulting Physician (Urology) Rockey Situ Kathlene November, MD as Consulting Physician (Cardiology) Abbie Sons, MD (Urology) Sindy Guadeloupe, MD as Consulting Physician (Hematology and Oncology)   Name of the patient: [name]  [id number]  12-28-1940   Date of visit: 01/26/22  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  At that time he was found to have a mild splenomegaly of 15.8 cm.DIPPS score is 70 (age 81- 1, hemoglobin less than 10- 2) and score of 4 if 1% circulating blasts included from 07/17/2014.   Bone marrow on 06/11/2014 was most consistent with primary myelofibrosis.  Bone marrow biopsy showed 1% abnormal cells: CD45+, CD5+, CD10, CD11c+/-, CD19+, CD2-+, (dim), CD22+ (dim, CD23+, CD38-/+, FMC7-, HLA-DR+, sig lambda+(dim).  Blasts were not increased 1.2%; hypercellular for age: 34%; JAK2 V617F mutation was negative.  CALR mutation positive.  Flow cytometry included about 1% CLL/SLL phenotype cells (AV6+) of uncertain significance and some infiltrate into the marrow with increased atypical megakaryocytes.  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  MDS FISH panel was negative.   Patient also follows up with [facility] benign hematology Dr. Sabino Dick for his anemia.  Patient's hemoglobin was drifting down to the eights and was started on EPO in October 2022.    Interval  history- Patient returns to clinic for continuation of EPO. He reports that he is doing well. He denies any concerns or complaints at this time. Tolerating EPO well.   ECOG PS- 1 Pain scale- 0  Review of systems- Review of Systems  Constitutional:  Negative for chills, fever, malaise/fatigue and weight loss.  HENT:  Negative for congestion, ear discharge and nosebleeds.   Eyes:  Negative for blurred vision.  Respiratory:  Negative for cough, hemoptysis, sputum production, shortness of breath and wheezing.   Cardiovascular:  Negative for chest pain, palpitations, orthopnea and claudication.  Gastrointestinal:  Negative for abdominal pain, blood in stool, constipation, diarrhea, heartburn, melena, nausea and vomiting.  Genitourinary:  Negative for dysuria, flank pain, frequency, hematuria and urgency.  Musculoskeletal:  Negative for back pain, joint pain and myalgias.  Skin:  Negative for rash.  Neurological:  Negative for dizziness, tingling, focal weakness, seizures, weakness and headaches.  Endo/Heme/Allergies:  Does not bruise/bleed easily.  Psychiatric/Behavioral:  Negative for depression and suicidal ideas. The patient does not have insomnia.      Allergies  Allergen Reactions   Meloxicam Nausea And Vomiting   Past Medical History:  Diagnosis Date   Arthritis    Benign hypertensive renal disease    GERD (gastroesophageal reflux disease)    Heartburn    History of retinal detachment    Hyperlipidemia    Hypertension    Melanoma ([place])    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 approximately 10-15 years ago   Myelofibrosis [facility])    Prostate hypertrophy    Stroke [facility]) June 2009   R brain subcortical infarct   Past Surgical History:  Procedure Laterality Date   ASPIRATION / INJECTION RENAL CYST  Feb 2017  BACK SURGERY     approx 20- 25 years ago   GAS INSERTION  08/11/2011   Procedure: INSERTION OF GAS;  Surgeon: Hayden Pedro, MD;  Location: [place];  Service:  Ophthalmology;  Laterality: Right;  C3F8   SCLERAL BUCKLE  08/11/2011   Procedure: SCLERAL BUCKLE;  Surgeon: Hayden Pedro, MD;  Location: [place];  Service: Ophthalmology;  Laterality: Right;   VARICOSE VEIN SURGERY     Social History   Socioeconomic History   Marital status: Married    Spouse name: Parke Simmers    Number of children: 2   Years of education: 12+   Highest education level: Some college, no degree  Occupational History   Occupation: Education officer, community: OTHER    Comment: community   Occupation: SELF EMPLOYED    Employer: SELF EMPLOYED  Tobacco Use   Smoking status: Former    Packs/day: 0.25    Types: Cigarettes    Quit date: 06/06/2010    Years since quitting: 11.6   Smokeless tobacco: Never  Vaping Use   Vaping Use: Never used  Substance and Sexual Activity   Alcohol use: No    Alcohol/week: 0.0 standard drinks of alcohol   Drug use: No   Sexual activity: Not Currently  Other Topics Concern   Not on file  Social History Narrative   Pt lives at home with his family.   Caffeine Use- 2 cups daily   Patient has 2 children.    Patient has some college.    Patient is right handed.          Works full time   Scientist, physiological Strain: Low Risk  (07/12/2021)   Overall Financial Resource Strain (CARDIA)    Difficulty of Paying Living Expenses: Not hard at all  Food Insecurity: No Food Insecurity (07/12/2021)   Hunger Vital Sign    Worried About Running Out of Food in the Last Year: Never true    [place] in the Last Year: Never true  Transportation Needs: No Transportation Needs (07/12/2021)   PRAPARE - Hydrologist (Medical): No    Lack of Transportation (Non-Medical): No  Physical Activity: Inactive (07/12/2021)   Exercise Vital Sign    Days of Exercise per Week: 0 days    Minutes of Exercise per Session: 0 min  Stress: No Stress Concern Present (07/12/2021)   [place]    Feeling of Stress : Not at all  Social Connections: [place] (07/12/2021)   Social Connection and Isolation Panel [NHANES]    Frequency of Communication with Friends and Family: Three times a week    Frequency of Social Gatherings with Friends and Family: More than three times a week    Attends Religious Services: More than 4 times per year    Active Member of [employer] or Organizations: Yes    Attends Music therapist: More than 4 times per year    Marital Status: Married  Human resources officer Violence: Not At Risk (07/12/2021)   Humiliation, Afraid, Rape, and Kick questionnaire    Fear of Current or Ex-Partner: No    Emotionally Abused: No    Physically Abused: No    Sexually Abused: No   Family History  Problem Relation Age of Onset   Heart disease Father    Diabetes Son    Kidney disease Neg Hx  Prostate cancer Neg Hx    Current Outpatient Medications:    albuterol (VENTOLIN HFA) 108 (90 Base) MCG/ACT inhaler, Inhale 2 puffs into the lungs every 6 (six) hours as needed for wheezing or shortness of breath., Disp: 8 g, Rfl: 0   amLODipine (NORVASC) 10 MG tablet, Take 1 tablet (10 mg total) by mouth daily. In the evening, Disp: 90 tablet, Rfl: 3   azelastine (ASTELIN) 0.1 % nasal spray, Place 1 spray into both nostrils 2 (two) times daily. Use in each nostril as directed, Disp: 30 mL, Rfl: 0   calcium carbonate (OSCAL) 1500 (600 Ca) MG TABS tablet, Take by mouth., Disp: , Rfl:    Cholecalciferol (D3-1000 PO), Take by mouth daily., Disp: , Rfl:    clopidogrel (PLAVIX) 75 MG tablet, Take 1 tablet (75 mg total) by mouth daily., Disp: 90 tablet, Rfl: 0   DULoxetine (CYMBALTA) 20 MG capsule, TAKE 1 CAPSULE BY MOUTH DAILY., Disp: 90 capsule, Rfl: 1   gabapentin (NEURONTIN) 300 MG capsule, Take 1 tab in the AM and 2 tabs at bedtime., Disp: 270 capsule, Rfl: 1   Ginsengs-Saw Palmetto (MULTI GINSENG & SAW PALMETTO) 500 MG CAPS, Take 500 mg  by mouth daily., Disp: , Rfl:    losartan (COZAAR) 100 MG tablet, Take 1 tablet (100 mg total) by mouth in the morning., Disp: 90 tablet, Rfl: 3   meclizine (ANTIVERT) 25 MG tablet, TAKE 1 TABLET BY MOUTH 3 TIMES DAILY AS NEEDED FOR DIZZINESS, Disp: 90 tablet, Rfl: 6   Multiple Vitamin (MULITIVITAMIN WITH MINERALS) TABS, Take 1 tablet by mouth daily., Disp: , Rfl:    Naproxen Sodium 220 MG CAPS, Take by mouth as needed., Disp: , Rfl:    omeprazole (PRILOSEC) 40 MG capsule, Take 1 capsule (40 mg total) by mouth daily., Disp: 90 capsule, Rfl: 1   simvastatin (ZOCOR) 40 MG tablet, Take 1 tablet (40 mg total) by mouth daily., Disp: 90 tablet, Rfl: 1   tadalafil (CIALIS) 5 MG tablet, Take 1 tablet (5 mg total) by mouth daily., Disp: 90 tablet, Rfl: 3   tamsulosin (FLOMAX) 0.4 MG CAPS capsule, Take 2 capsules (0.8 mg total) by mouth daily., Disp: 180 capsule, Rfl: 1   zinc gluconate 50 MG tablet, Take by mouth., Disp: , Rfl:   Physical exam:  Vitals:   01/26/22 0859  BP: 130/73  Pulse: 70  Temp: (!) 97 F (36.1 C)  TempSrc: Tympanic  Weight: 186 lb (84.4 kg)   Physical Exam Constitutional:      General: He is not in acute distress. Cardiovascular:     Rate and Rhythm: Normal rate and regular rhythm.     Heart sounds: Normal heart sounds.  Pulmonary:     Effort: Pulmonary effort is normal.     Breath sounds: Normal breath sounds.  Abdominal:     General: Bowel sounds are normal.     Palpations: Abdomen is soft.  Skin:    General: Skin is warm and dry.  Neurological:     Mental Status: He is alert and oriented to person, place, and time.         Latest Ref Rng & Units 01/26/2022    8:40 AM  CMP  Glucose 70 - 99 mg/dL 111   BUN 8 - 23 mg/dL 37   Creatinine 0.61 - 1.24 mg/dL 1.77   Sodium 135 - 145 mmol/L 139   Potassium 3.5 - 5.1 mmol/L 4.6   Chloride 98 - 111 mmol/L 111  CO2 22 - 32 mmol/L 20   Calcium 8.9 - 10.3 mg/dL 8.4   Total Protein 6.5 - 8.1 g/dL 6.1   Total  Bilirubin 0.3 - 1.2 mg/dL 0.6   Alkaline Phos 38 - 126 U/L 78   AST 15 - 41 U/L 23   ALT 0 - 44 U/L 20       Latest Ref Rng & Units 01/26/2022    8:40 AM  CBC  WBC 4.0 - 10.5 K/uL 3.0   Hemoglobin 13.0 - 17.0 g/dL 9.8   Hematocrit 39.0 - 52.0 % 30.5   Platelets 150 - 400 K/uL 168     Assessment and plan- Patient is a 81 y.o. male with history of   Primary myelofibrosis with predominant anemia-patient I reviewed labs.  His hemoglobin is down slightly to 9.8 today.  Currently receiving 50,000 units retacrit weekly.  Resume Retacrit today.  Plan for EPO if hemoglobin < 10. Thus far he has had good response to EPO and we will continue treatment.  Per past notes, if loss of response would consider clinical trial vs lenalidomide +/- prednisone vs luspatercept.  Anemia- d/t primary myelofibrosis.  Chronic.  See above.  Currently receiving EPO. B12, ferritin, and iron studies were normal with no indication for replenishment.  Per last notes, consider leuko reduced transfusion if hemoglobin < 7.  Hypocalcemia-chronic and stable.  His calcium value today is 8.4.  Encouraged calcium and vitamin d supplementation. He can follow up with pcp.   PLAN: Weekly- lab (h&h) +/- retacrit (pt prefers Wednesday mornings) 3 mo- lab (cbc, cmp, ldh, ferritin, iron studies, b12), Dr Janese Banks, +/- retacrit-    Visit Diagnosis 1. Myelofibrosis ([place])   2. Erythropoietin (EPO) stimulating agent anemia management patient   3. Vitamin B12 deficiency   4. Anemia of chronic kidney failure, stage 3 (moderate) (HCC)   5. Hypocalcemia    Nelwyn Salisbury [place] at [facility] [phone] (clinic) 01/26/2022

## 2022-02-01 ENCOUNTER — Encounter: Payer: Self-pay | Admitting: Family Medicine

## 2022-02-01 ENCOUNTER — Other Ambulatory Visit: Payer: Medicare Other

## 2022-02-01 ENCOUNTER — Ambulatory Visit (INDEPENDENT_AMBULATORY_CARE_PROVIDER_SITE_OTHER): Payer: Medicare Other | Admitting: Family Medicine

## 2022-02-01 VITALS — BP 105/55 | HR 92 | Temp 98.4°F | Wt 186.0 lb

## 2022-02-01 DIAGNOSIS — J069 Acute upper respiratory infection, unspecified: Secondary | ICD-10-CM | POA: Diagnosis not present

## 2022-02-01 DIAGNOSIS — I639 Cerebral infarction, unspecified: Secondary | ICD-10-CM

## 2022-02-01 MED ORDER — PREDNISONE 50 MG PO TABS: 50.0000 mg | ORAL_TABLET | Freq: Every day | ORAL | 0 refills | Status: DC

## 2022-02-01 MED ORDER — HYDROCOD POLI-CHLORPHE POLI ER 10-8 MG/5ML PO SUER: 5.0000 mL | Freq: Every evening | ORAL | 0 refills | Status: DC | PRN

## 2022-02-01 NOTE — Progress Notes (Signed)
BP (!) 105/55   Pulse 92   Temp 98.4 F (36.9 C)   Wt 186 lb (84.4 kg)   SpO2 93%   BMI 26.69 kg/m    Subjective:    Patient ID: [name], male    DOB: Oct 12, 1940, 81 y.o.   MRN: [id number]  HPI: [name] is a 81 y.o. male  Chief Complaint  Patient presents with   Cough    Patient states he has been coughing, sneezing and has a runny nose. Per patient wife he was exposed to [place] at work. Symptoms started last night.    UPPER RESPIRATORY TRACT INFECTION Duration: 1 day Worst symptom: cough Fever: no Cough: yes Shortness of breath: yes Wheezing: yes Chest pain: no Chest tightness: yes Chest congestion: no Nasal congestion: yes Runny nose: yes Post nasal drip: yes Sneezing: yes Sore throat: yes Swollen glands: no Sinus pressure: no Headache: no Face pain: no Toothache: no Ear pain: no  Ear pressure: no  Eyes red/itching:no Eye drainage/crusting: no  Vomiting: no Rash: no Fatigue: yes Sick contacts: yes Strep contacts: no  Context: worse Recurrent sinusitis: no Relief with OTC cold/cough medications: no  Treatments attempted: none   Relevant past medical, surgical, family and social history reviewed and updated as indicated. Interim medical history since our last visit reviewed. Allergies and medications reviewed and updated.  Review of Systems  Constitutional:  Positive for chills, diaphoresis, fatigue and fever. Negative for activity change, appetite change and unexpected weight change.  HENT:  Positive for congestion, hearing loss, postnasal drip, rhinorrhea, sneezing and sore throat. Negative for dental problem, drooling, ear discharge, ear pain, facial swelling, mouth sores, nosebleeds, sinus pressure, sinus pain, tinnitus, trouble swallowing and voice change.   Eyes: Negative.   Respiratory:  Positive for cough, chest tightness and shortness of breath. Negative for apnea, choking, wheezing and stridor.   Cardiovascular: Negative.    Gastrointestinal: Negative.   Musculoskeletal: Negative.   Neurological: Negative.   Psychiatric/Behavioral: Negative.      Per HPI unless specifically indicated above     Objective:    BP (!) 105/55   Pulse 92   Temp 98.4 F (36.9 C)   Wt 186 lb (84.4 kg)   SpO2 93%   BMI 26.69 kg/m   Wt Readings from Last 3 Encounters:  02/01/22 186 lb (84.4 kg)  01/26/22 186 lb (84.4 kg)  01/20/22 186 lb 12.8 oz (84.7 kg)    Physical Exam Vitals and nursing note reviewed.  Constitutional:      General: He is not in acute distress.    Appearance: Normal appearance. He is ill-appearing. He is not toxic-appearing or diaphoretic.  HENT:     Head: Normocephalic and atraumatic.     Right Ear: Tympanic membrane, ear canal and external ear normal. There is no impacted cerumen.     Left Ear: Tympanic membrane, ear canal and external ear normal. There is no impacted cerumen.     Nose: Congestion and rhinorrhea present.     Mouth/Throat:     Mouth: Mucous membranes are moist.     Pharynx: Oropharynx is clear. No oropharyngeal exudate or posterior oropharyngeal erythema.  Eyes:     General: No scleral icterus.       Right eye: No discharge.        Left eye: No discharge.     Extraocular Movements: Extraocular movements intact.     Conjunctiva/sclera: Conjunctivae normal.     Pupils: Pupils are equal,  round, and reactive to light.  Cardiovascular:     Rate and Rhythm: Normal rate and regular rhythm.     Pulses: Normal pulses.     Heart sounds: Normal heart sounds. No murmur heard.    No friction rub. No gallop.  Pulmonary:     Effort: Pulmonary effort is normal. No respiratory distress.     Breath sounds: Normal breath sounds. No stridor. No wheezing, rhonchi or rales.  Chest:     Chest wall: No tenderness.  Musculoskeletal:        General: Normal range of motion.     Cervical back: Normal range of motion and neck supple.  Skin:    General: Skin is warm and dry.     Capillary  Refill: Capillary refill takes less than 2 seconds.     Coloration: Skin is not jaundiced or pale.     Findings: No bruising, erythema, lesion or rash.  Neurological:     General: No focal deficit present.     Mental Status: He is alert and oriented to person, place, and time. Mental status is at baseline.  Psychiatric:        Mood and Affect: Mood normal.        Behavior: Behavior normal.        Thought Content: Thought content normal.        Judgment: Judgment normal.     Results for orders placed or performed in visit on 01/26/22  CBC with Differential/Platelet  Result Value Ref Range   WBC 3.0 (L) 4.0 - 10.5 K/uL   RBC 3.38 (L) 4.22 - 5.81 MIL/uL   Hemoglobin 9.8 (L) 13.0 - 17.0 g/dL   HCT 30.5 (L) 39.0 - 52.0 %   MCV 90.2 80.0 - 100.0 fL   MCH 29.0 26.0 - 34.0 pg   MCHC 32.1 30.0 - 36.0 g/dL   RDW 17.8 (H) 11.5 - 15.5 %   Platelets 168 150 - 400 K/uL   nRBC 1.0 (H) 0.0 - 0.2 %   Neutrophils Relative % 46 %   Neutro Abs 1.4 (L) 1.7 - 7.7 K/uL   Lymphocytes Relative 21 %   Lymphs Abs 0.7 0.7 - 4.0 K/uL   Monocytes Relative 18 %   Monocytes Absolute 0.6 0.1 - 1.0 K/uL   Eosinophils Relative 1 %   Eosinophils Absolute 0.0 0.0 - 0.5 K/uL   Basophils Relative 1 %   Basophils Absolute 0.0 0.0 - 0.1 K/uL   WBC Morphology      MARKED LEFT SHIFT (>5% METAS,MYELOS AND PROS, OCC BLAST NOTED)   RBC Morphology OCC NRBCS NOTED    Smear Review Normal platelet morphology    Immature Granulocytes 13 %   Abs Immature Granulocytes 0.40 (H) 0.00 - 0.07 K/uL   Tear Drop Cells PRESENT   Comprehensive metabolic panel  Result Value Ref Range   Sodium 139 135 - 145 mmol/L   Potassium 4.6 3.5 - 5.1 mmol/L   Chloride 111 98 - 111 mmol/L   CO2 20 (L) 22 - 32 mmol/L   Glucose, Bld 111 (H) 70 - 99 mg/dL   BUN 37 (H) 8 - 23 mg/dL   Creatinine, Ser 1.77 (H) 0.61 - 1.24 mg/dL   Calcium 8.4 (L) 8.9 - 10.3 mg/dL   Total Protein 6.1 (L) 6.5 - 8.1 g/dL   Albumin 3.7 3.5 - 5.0 g/dL   AST 23 15  - 41 U/L   ALT 20 0 - 44 U/L  Alkaline Phosphatase 78 38 - 126 U/L   Total Bilirubin 0.6 0.3 - 1.2 mg/dL   GFR, Estimated 38 (L) >60 mL/min   Anion gap 8 5 - 15  Ferritin  Result Value Ref Range   Ferritin 188 24 - 336 ng/mL  Iron and TIBC  Result Value Ref Range   Iron 66 45 - 182 ug/dL   TIBC 267 250 - 450 ug/dL   Saturation Ratios 25 17.9 - 39.5 %   UIBC 201 ug/dL  Vitamin B12  Result Value Ref Range   Vitamin B-12 965 (H) 180 - 914 pg/mL  Lactate dehydrogenase  Result Value Ref Range   LDH 384 (H) 98 - 192 U/L      Assessment & Plan:   Problem List Items Addressed This Visit   None Visit Diagnoses     Upper respiratory tract infection, unspecified type    -  Primary   Concern for COVID due to exposure. Will treat with prednisone and tussionex. Await results. Treat as needed.    Relevant Orders   Novel Coronavirus, NAA ([facility])   Rapid Strep screen([facility])   Influenza a and b        Follow up plan: Return if symptoms worsen or fail to improve.

## 2022-02-02 ENCOUNTER — Inpatient Hospital Stay: Payer: Medicare Other

## 2022-02-02 ENCOUNTER — Inpatient Hospital Stay: Payer: Medicare Other | Admitting: Nurse Practitioner

## 2022-02-02 ENCOUNTER — Telehealth: Payer: Self-pay

## 2022-02-02 NOTE — Telephone Encounter (Signed)
Spoke to pt re: appointments this morning. Pt is not feeling well and dealing with upper respiratory infection, COVID test results are pending. Pt prefers to cancel today's appointments. Will keep weekly appointments as scheduled.

## 2022-02-03 ENCOUNTER — Other Ambulatory Visit: Payer: Self-pay | Admitting: Family Medicine

## 2022-02-03 LAB — NOVEL CORONAVIRUS, NAA: SARS-CoV-2, NAA: DETECTED — AB

## 2022-02-03 MED ORDER — MOLNUPIRAVIR EUA 200MG CAPSULE: 4.0000 | ORAL_CAPSULE | Freq: Two times a day (BID) | ORAL | 0 refills | Status: AC

## 2022-02-04 LAB — CULTURE, GROUP A STREP: Strep A Culture: NEGATIVE

## 2022-02-04 LAB — RAPID STREP SCREEN (MED CTR MEBANE ONLY): Strep Gp A Ag, IA W/Reflex: NEGATIVE

## 2022-02-04 LAB — VERITOR FLU A/B WAIVED
Influenza A: NEGATIVE
Influenza B: NEGATIVE

## 2022-02-08 ENCOUNTER — Ambulatory Visit: Payer: Medicare Other | Admitting: Family Medicine

## 2022-02-09 ENCOUNTER — Inpatient Hospital Stay: Payer: Medicare Other | Attending: Oncology

## 2022-02-09 ENCOUNTER — Inpatient Hospital Stay (HOSPITAL_BASED_OUTPATIENT_CLINIC_OR_DEPARTMENT_OTHER): Payer: Medicare Other | Admitting: Medical Oncology

## 2022-02-09 ENCOUNTER — Inpatient Hospital Stay: Payer: Medicare Other

## 2022-02-09 VITALS — BP 128/63 | HR 67 | Temp 98.0°F | Resp 16 | Wt 190.0 lb

## 2022-02-09 DIAGNOSIS — D7581 Myelofibrosis: Secondary | ICD-10-CM

## 2022-02-09 DIAGNOSIS — Z79899 Other long term (current) drug therapy: Secondary | ICD-10-CM

## 2022-02-09 DIAGNOSIS — D631 Anemia in chronic kidney disease: Secondary | ICD-10-CM | POA: Diagnosis not present

## 2022-02-09 DIAGNOSIS — D473 Essential (hemorrhagic) thrombocythemia: Secondary | ICD-10-CM | POA: Diagnosis not present

## 2022-02-09 DIAGNOSIS — N183 Chronic kidney disease, stage 3 unspecified: Secondary | ICD-10-CM

## 2022-02-09 DIAGNOSIS — D649 Anemia, unspecified: Secondary | ICD-10-CM

## 2022-02-09 LAB — HEMOGLOBIN AND HEMATOCRIT, BLOOD
HCT: 28.9 % — ABNORMAL LOW (ref 39.0–52.0)
Hemoglobin: 9.1 g/dL — ABNORMAL LOW (ref 13.0–17.0)

## 2022-02-09 MED ORDER — EPOETIN ALFA 10000 UNIT/ML IJ SOLN
10000.0000 [IU] | Freq: Once | INTRAMUSCULAR | Status: AC
  Administered 2022-02-09: 10000 [IU] via SUBCUTANEOUS
  Filled 2022-02-09: qty 1

## 2022-02-09 MED ORDER — EPOETIN ALFA 40000 UNIT/ML IJ SOLN
40000.0000 [IU] | Freq: Once | INTRAMUSCULAR | Status: AC
  Administered 2022-02-09: 40000 [IU] via SUBCUTANEOUS
  Filled 2022-02-09: qty 1

## 2022-02-09 MED ORDER — EPOETIN ALFA 20000 UNIT/ML IJ SOLN: 50000.0000 [IU] | Freq: Once | INTRAMUSCULAR | Status: DC

## 2022-02-09 MED ORDER — EPOETIN ALFA 10000 UNIT/ML IJ SOLN
10000.0000 [IU] | Freq: Once | INTRAMUSCULAR | Status: DC
  Filled 2022-02-09: qty 1

## 2022-02-09 NOTE — Progress Notes (Signed)
Here for injection today. States that he is feeling well. COVID positive over a week ago. Reports that symptoms have resolved.

## 2022-02-09 NOTE — Progress Notes (Signed)
Hematology/Oncology Consult Note [facility]  [phone] [phone]  Patient Care Team: [name], DO as PCP - General (Family Medicine) Pieter Partridge, MD as Referring Physician (Hematology) Hayden Pedro, MD as Consulting Physician (Ophthalmology) Garvin Fila, MD as Consulting Physician (Neurology) Festus Aloe, MD as Consulting Physician (Urology) Rockey Situ Kathlene November, MD as Consulting Physician (Cardiology) Abbie Sons, MD (Urology) Sindy Guadeloupe, MD as Consulting Physician (Hematology and Oncology)   Name of the patient: [name]  [id number]  10-29-1940   Date of visit: 02/09/22  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  At that time he was found to have a mild splenomegaly of 15.8 cm.DIPPS score is 50 (age 33- 1, hemoglobin less than 10- 2) and score of 4 if 1% circulating blasts included from 07/17/2014.   Bone marrow on 06/11/2014 was most consistent with primary myelofibrosis.  Bone marrow biopsy showed 1% abnormal cells: CD45+, CD5+, CD10, CD11c+/-, CD19+, CD2-+, (dim), CD22+ (dim, CD23+, CD38-/+, FMC7-, HLA-DR+, sig lambda+(dim).  Blasts were not increased 1.2%; hypercellular for age: 11%; JAK2 V617F mutation was negative.  CALR mutation positive.  Flow cytometry included about 1% CLL/SLL phenotype cells (FW2+) of uncertain significance and some infiltrate into the marrow with increased atypical megakaryocytes.  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  MDS FISH panel was negative.   Patient also follows up with [facility] benign hematology Dr. Sabino Dick for his anemia.  Patient's hemoglobin was drifting down to the eights and was started on EPO in October 2022.    Interval  history- Patient returns to clinic for continuation of EPO. Today he reports that he is feeling well. He had COVID-19 last week but is feeling completely improved. In terms of his anemia he denies any bleeding or bruising episodes. No new fatigue, SOB. Tolerating EPO well.   ECOG PS- 1 Pain scale- 0  Review of systems- Review of Systems  Constitutional:  Negative for chills, fever, malaise/fatigue and weight loss.  HENT:  Negative for congestion, ear discharge and nosebleeds.   Eyes:  Negative for blurred vision.  Respiratory:  Negative for cough, hemoptysis, sputum production, shortness of breath and wheezing.   Cardiovascular:  Negative for chest pain, palpitations, orthopnea and claudication.  Gastrointestinal:  Negative for abdominal pain, blood in stool, constipation, diarrhea, heartburn, melena, nausea and vomiting.  Genitourinary:  Negative for dysuria, flank pain, frequency, hematuria and urgency.  Musculoskeletal:  Negative for back pain, joint pain and myalgias.  Skin:  Negative for rash.  Neurological:  Negative for dizziness, tingling, focal weakness, seizures, weakness and headaches.  Endo/Heme/Allergies:  Does not bruise/bleed easily.  Psychiatric/Behavioral:  Negative for depression and suicidal ideas. The patient does not have insomnia.      Allergies  Allergen Reactions   Meloxicam Nausea And Vomiting   Past Medical History:  Diagnosis Date   Arthritis    Benign hypertensive renal disease    GERD (gastroesophageal reflux disease)    Heartburn    History of retinal detachment    Hyperlipidemia    Hypertension    Melanoma ([place])    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 approximately 10-15 years ago   Myelofibrosis [facility])    Prostate hypertrophy    Stroke [facility]) June 2009   R brain subcortical infarct  Past Surgical History:  Procedure Laterality Date   ASPIRATION / INJECTION RENAL CYST  Feb 2017   BACK SURGERY     approx 20- 25 years ago   GAS INSERTION   08/11/2011   Procedure: INSERTION OF GAS;  Surgeon: Hayden Pedro, MD;  Location: [place];  Service: Ophthalmology;  Laterality: Right;  [id number]   SCLERAL BUCKLE  08/11/2011   Procedure: SCLERAL BUCKLE;  Surgeon: Hayden Pedro, MD;  Location: [place];  Service: Ophthalmology;  Laterality: Right;   VARICOSE VEIN SURGERY     Social History   Socioeconomic History   Marital status: Married    Spouse name: Parke Simmers    Number of children: 2   Years of education: 12+   Highest education level: Some college, no degree  Occupational History   Occupation: Education officer, community: OTHER    Comment: community   Occupation: SELF EMPLOYED    Employer: SELF EMPLOYED  Tobacco Use   Smoking status: Former    Packs/day: 0.25    Types: Cigarettes    Quit date: 06/06/2010    Years since quitting: 11.6   Smokeless tobacco: Never  Vaping Use   Vaping Use: Never used  Substance and Sexual Activity   Alcohol use: No    Alcohol/week: 0.0 standard drinks of alcohol   Drug use: No   Sexual activity: Not Currently  Other Topics Concern   Not on file  Social History Narrative   Pt lives at home with his family.   Caffeine Use- 2 cups daily   Patient has 2 children.    Patient has some college.    Patient is right handed.          Works full time   Scientist, physiological Strain: Low Risk  (07/12/2021)   Overall Financial Resource Strain (CARDIA)    Difficulty of Paying Living Expenses: Not hard at all  Food Insecurity: No Food Insecurity (07/12/2021)   Hunger Vital Sign    Worried About Running Out of Food in the Last Year: Never true    [place] in the Last Year: Never true  Transportation Needs: No Transportation Needs (07/12/2021)   PRAPARE - Hydrologist (Medical): No    Lack of Transportation (Non-Medical): No  Physical Activity: Inactive (07/12/2021)   Exercise Vital Sign    Days of Exercise per Week: 0 days    Minutes of Exercise per  Session: 0 min  Stress: No Stress Concern Present (07/12/2021)   [place]    Feeling of Stress : Not at all  Social Connections: [place] (07/12/2021)   Social Connection and Isolation Panel [NHANES]    Frequency of Communication with Friends and Family: Three times a week    Frequency of Social Gatherings with Friends and Family: More than three times a week    Attends Religious Services: More than 4 times per year    Active Member of [employer] or Organizations: Yes    Attends Archivist Meetings: More than 4 times per year    Marital Status: Married  Human resources officer Violence: Not At Risk (07/12/2021)   Humiliation, Afraid, Rape, and Kick questionnaire    Fear of Current or Ex-Partner: No    Emotionally Abused: No    Physically Abused: No    Sexually Abused: No   Family History  Problem Relation Age of  Onset   Heart disease Father    Diabetes Son    Kidney disease Neg Hx    Prostate cancer Neg Hx    Current Outpatient Medications:    albuterol (VENTOLIN HFA) 108 (90 Base) MCG/ACT inhaler, Inhale 2 puffs into the lungs every 6 (six) hours as needed for wheezing or shortness of breath., Disp: 8 g, Rfl: 0   amLODipine (NORVASC) 10 MG tablet, Take 1 tablet (10 mg total) by mouth daily. In the evening, Disp: 90 tablet, Rfl: 3   azelastine (ASTELIN) 0.1 % nasal spray, Place 1 spray into both nostrils 2 (two) times daily. Use in each nostril as directed, Disp: 30 mL, Rfl: 0   calcium carbonate (OSCAL) 1500 (600 Ca) MG TABS tablet, Take by mouth., Disp: , Rfl:    chlorpheniramine-HYDROcodone (TUSSIONEX) 10-8 MG/5ML, Take 5 mLs by mouth at bedtime as needed for cough., Disp: 25 mL, Rfl: 0   Cholecalciferol (D3-1000 PO), Take by mouth daily., Disp: , Rfl:    clopidogrel (PLAVIX) 75 MG tablet, Take 1 tablet (75 mg total) by mouth daily., Disp: 90 tablet, Rfl: 0   DULoxetine (CYMBALTA) 20 MG capsule, TAKE 1 CAPSULE  BY MOUTH DAILY., Disp: 90 capsule, Rfl: 1   gabapentin (NEURONTIN) 300 MG capsule, Take 1 tab in the AM and 2 tabs at bedtime., Disp: 270 capsule, Rfl: 1   Ginsengs-Saw Palmetto (MULTI GINSENG & SAW PALMETTO) 500 MG CAPS, Take 500 mg by mouth daily., Disp: , Rfl:    losartan (COZAAR) 100 MG tablet, Take 1 tablet (100 mg total) by mouth in the morning., Disp: 90 tablet, Rfl: 3   meclizine (ANTIVERT) 25 MG tablet, TAKE 1 TABLET BY MOUTH 3 TIMES DAILY AS NEEDED FOR DIZZINESS, Disp: 90 tablet, Rfl: 6   Multiple Vitamin (MULITIVITAMIN WITH MINERALS) TABS, Take 1 tablet by mouth daily., Disp: , Rfl:    Naproxen Sodium 220 MG CAPS, Take by mouth as needed., Disp: , Rfl:    omeprazole (PRILOSEC) 40 MG capsule, Take 1 capsule (40 mg total) by mouth daily., Disp: 90 capsule, Rfl: 1   predniSONE (DELTASONE) 50 MG tablet, Take 1 tablet (50 mg total) by mouth daily with breakfast., Disp: 5 tablet, Rfl: 0   simvastatin (ZOCOR) 40 MG tablet, Take 1 tablet (40 mg total) by mouth daily., Disp: 90 tablet, Rfl: 1   tadalafil (CIALIS) 5 MG tablet, Take 1 tablet (5 mg total) by mouth daily., Disp: 90 tablet, Rfl: 3   tamsulosin (FLOMAX) 0.4 MG CAPS capsule, Take 2 capsules (0.8 mg total) by mouth daily., Disp: 180 capsule, Rfl: 1   zinc gluconate 50 MG tablet, Take by mouth., Disp: , Rfl:   Physical exam:  Vitals:   02/09/22 0927  BP: 128/63  Pulse: 67  Resp: 16  Temp: 98 F (36.7 C)  TempSrc: Oral  SpO2: 99%  Weight: 190 lb (86.2 kg)   Physical Exam Constitutional:      General: He is not in acute distress. Cardiovascular:     Rate and Rhythm: Normal rate and regular rhythm.     Heart sounds: Normal heart sounds.  Pulmonary:     Effort: Pulmonary effort is normal.     Breath sounds: Normal breath sounds.  Abdominal:     General: Bowel sounds are normal.     Palpations: Abdomen is soft.  Skin:    General: Skin is warm and dry.  Neurological:     Mental Status: He is alert and oriented to person,  place, and time.         Latest Ref Rng & Units 01/26/2022    8:40 AM  CMP  Glucose 70 - 99 mg/dL 111   BUN 8 - 23 mg/dL 37   Creatinine 0.61 - 1.24 mg/dL 1.77   Sodium 135 - 145 mmol/L 139   Potassium 3.5 - 5.1 mmol/L 4.6   Chloride 98 - 111 mmol/L 111   CO2 22 - 32 mmol/L 20   Calcium 8.9 - 10.3 mg/dL 8.4   Total Protein 6.5 - 8.1 g/dL 6.1   Total Bilirubin 0.3 - 1.2 mg/dL 0.6   Alkaline Phos 38 - 126 U/L 78   AST 15 - 41 U/L 23   ALT 0 - 44 U/L 20       Latest Ref Rng & Units 02/09/2022    8:57 AM  CBC  Hemoglobin 13.0 - 17.0 g/dL 9.1   Hematocrit 39.0 - 52.0 % 28.9     Assessment and plan- Patient is a 81 y.o. male with history of   Primary myelofibrosis with predominant anemia-patient I reviewed labs. His hemoglobin is 9.1 today- likely lower than his average due to missing EPO last week secondary to COVID-19 illness. Currently receiving 50,000 units retacrit weekly.  Resume Retacrit today.  Plan for EPO if hemoglobin < 10. Thus far he has had good response to EPO and we will continue treatment.  Per past notes, if loss of response would consider clinical trial vs lenalidomide +/- prednisone vs luspatercept.  Anemia- d/t primary myelofibrosis.  Chronic.  See above.  Per last notes: B12, ferritin, and iron studies were normal with no indication for replenishment.  Consider leuko reduced transfusion if hemoglobin < 7.   PLAN: Weekly- lab (h&h) +/- retacrit (pt prefers Wednesday mornings) 3 mo- lab (cbc, cmp, ldh, ferritin, iron studies, b12), Dr Janese Banks, +/- retacrit- [place]   Visit Diagnosis 1. Myelofibrosis ([place])   2. Erythropoietin (EPO) stimulating agent anemia management patient   3. Anemia of chronic kidney failure, stage 3 (moderate) ([place])    [place] at [facility] [phone] (clinic) 02/09/2022

## 2022-02-16 ENCOUNTER — Inpatient Hospital Stay: Payer: Medicare Other

## 2022-02-16 VITALS — BP 150/75 | HR 71

## 2022-02-16 DIAGNOSIS — D7581 Myelofibrosis: Secondary | ICD-10-CM | POA: Diagnosis not present

## 2022-02-16 DIAGNOSIS — M5033 Other cervical disc degeneration, cervicothoracic region: Secondary | ICD-10-CM | POA: Diagnosis not present

## 2022-02-16 DIAGNOSIS — M5412 Radiculopathy, cervical region: Secondary | ICD-10-CM | POA: Diagnosis not present

## 2022-02-16 DIAGNOSIS — D649 Anemia, unspecified: Secondary | ICD-10-CM

## 2022-02-16 DIAGNOSIS — D631 Anemia in chronic kidney disease: Secondary | ICD-10-CM | POA: Diagnosis not present

## 2022-02-16 DIAGNOSIS — M9903 Segmental and somatic dysfunction of lumbar region: Secondary | ICD-10-CM | POA: Diagnosis not present

## 2022-02-16 DIAGNOSIS — D473 Essential (hemorrhagic) thrombocythemia: Secondary | ICD-10-CM | POA: Diagnosis not present

## 2022-02-16 DIAGNOSIS — N183 Chronic kidney disease, stage 3 unspecified: Secondary | ICD-10-CM | POA: Diagnosis not present

## 2022-02-16 DIAGNOSIS — M9901 Segmental and somatic dysfunction of cervical region: Secondary | ICD-10-CM | POA: Diagnosis not present

## 2022-02-16 LAB — HEMOGLOBIN AND HEMATOCRIT, BLOOD
HCT: 28.1 % — ABNORMAL LOW (ref 39.0–52.0)
Hemoglobin: 8.6 g/dL — ABNORMAL LOW (ref 13.0–17.0)

## 2022-02-16 MED ORDER — EPOETIN ALFA 10000 UNIT/ML IJ SOLN
10000.0000 [IU] | Freq: Once | INTRAMUSCULAR | Status: AC
  Administered 2022-02-16: 10000 [IU] via SUBCUTANEOUS
  Filled 2022-02-16: qty 1

## 2022-02-16 MED ORDER — EPOETIN ALFA 40000 UNIT/ML IJ SOLN
40000.0000 [IU] | Freq: Once | INTRAMUSCULAR | Status: AC
  Administered 2022-02-16: 40000 [IU] via SUBCUTANEOUS
  Filled 2022-02-16: qty 1

## 2022-02-16 MED ORDER — EPOETIN ALFA 20000 UNIT/ML IJ SOLN: 50000.0000 [IU] | Freq: Once | INTRAMUSCULAR | Status: DC

## 2022-02-23 ENCOUNTER — Inpatient Hospital Stay: Payer: Medicare Other

## 2022-02-23 VITALS — BP 150/71 | HR 78

## 2022-02-23 DIAGNOSIS — D631 Anemia in chronic kidney disease: Secondary | ICD-10-CM | POA: Diagnosis not present

## 2022-02-23 DIAGNOSIS — D7581 Myelofibrosis: Secondary | ICD-10-CM | POA: Diagnosis not present

## 2022-02-23 DIAGNOSIS — D649 Anemia, unspecified: Secondary | ICD-10-CM

## 2022-02-23 DIAGNOSIS — N183 Chronic kidney disease, stage 3 unspecified: Secondary | ICD-10-CM | POA: Diagnosis not present

## 2022-02-23 DIAGNOSIS — D473 Essential (hemorrhagic) thrombocythemia: Secondary | ICD-10-CM | POA: Diagnosis not present

## 2022-02-23 LAB — HEMOGLOBIN AND HEMATOCRIT, BLOOD
HCT: 30.2 % — ABNORMAL LOW (ref 39.0–52.0)
Hemoglobin: 9.4 g/dL — ABNORMAL LOW (ref 13.0–17.0)

## 2022-02-23 MED ORDER — EPOETIN ALFA 10000 UNIT/ML IJ SOLN
10000.0000 [IU] | INTRAMUSCULAR | Status: DC
  Administered 2022-02-23: 10000 [IU] via INTRAVENOUS
  Filled 2022-02-23: qty 1

## 2022-02-23 MED ORDER — EPOETIN ALFA 20000 UNIT/ML IJ SOLN: 50000.0000 [IU] | Freq: Once | INTRAMUSCULAR | Status: DC

## 2022-02-23 MED ORDER — EPOETIN ALFA 40000 UNIT/ML IJ SOLN
40000.0000 [IU] | Freq: Once | INTRAMUSCULAR | Status: AC
  Administered 2022-02-23: 40000 [IU] via SUBCUTANEOUS
  Filled 2022-02-23: qty 1

## 2022-03-01 ENCOUNTER — Ambulatory Visit (INDEPENDENT_AMBULATORY_CARE_PROVIDER_SITE_OTHER): Payer: Medicare Other

## 2022-03-01 DIAGNOSIS — Z23 Encounter for immunization: Secondary | ICD-10-CM | POA: Diagnosis not present

## 2022-03-02 ENCOUNTER — Inpatient Hospital Stay: Payer: Medicare Other

## 2022-03-02 VITALS — BP 136/69 | HR 77

## 2022-03-02 DIAGNOSIS — D473 Essential (hemorrhagic) thrombocythemia: Secondary | ICD-10-CM | POA: Diagnosis not present

## 2022-03-02 DIAGNOSIS — D631 Anemia in chronic kidney disease: Secondary | ICD-10-CM

## 2022-03-02 DIAGNOSIS — D7581 Myelofibrosis: Secondary | ICD-10-CM

## 2022-03-02 DIAGNOSIS — D649 Anemia, unspecified: Secondary | ICD-10-CM

## 2022-03-02 DIAGNOSIS — N183 Chronic kidney disease, stage 3 unspecified: Secondary | ICD-10-CM | POA: Diagnosis not present

## 2022-03-02 LAB — HEMOGLOBIN AND HEMATOCRIT, BLOOD
HCT: 31 % — ABNORMAL LOW (ref 39.0–52.0)
Hemoglobin: 9.7 g/dL — ABNORMAL LOW (ref 13.0–17.0)

## 2022-03-02 MED ORDER — EPOETIN ALFA 40000 UNIT/ML IJ SOLN
40000.0000 [IU] | Freq: Once | INTRAMUSCULAR | Status: AC
  Administered 2022-03-02: 40000 [IU] via SUBCUTANEOUS
  Filled 2022-03-02: qty 1

## 2022-03-02 MED ORDER — EPOETIN ALFA 10000 UNIT/ML IJ SOLN
10000.0000 [IU] | Freq: Once | INTRAMUSCULAR | Status: AC
  Administered 2022-03-02: 10000 [IU] via SUBCUTANEOUS
  Filled 2022-03-02: qty 1

## 2022-03-02 MED ORDER — EPOETIN ALFA 20000 UNIT/ML IJ SOLN: 50000.0000 [IU] | Freq: Once | INTRAMUSCULAR | Status: DC

## 2022-03-09 ENCOUNTER — Inpatient Hospital Stay: Payer: Medicare Other | Attending: Oncology

## 2022-03-09 ENCOUNTER — Inpatient Hospital Stay: Payer: Medicare Other

## 2022-03-09 DIAGNOSIS — M5412 Radiculopathy, cervical region: Secondary | ICD-10-CM | POA: Diagnosis not present

## 2022-03-09 DIAGNOSIS — N183 Chronic kidney disease, stage 3 unspecified: Secondary | ICD-10-CM | POA: Diagnosis not present

## 2022-03-09 DIAGNOSIS — D473 Essential (hemorrhagic) thrombocythemia: Secondary | ICD-10-CM | POA: Diagnosis not present

## 2022-03-09 DIAGNOSIS — M9901 Segmental and somatic dysfunction of cervical region: Secondary | ICD-10-CM | POA: Diagnosis not present

## 2022-03-09 DIAGNOSIS — D649 Anemia, unspecified: Secondary | ICD-10-CM

## 2022-03-09 DIAGNOSIS — D7581 Myelofibrosis: Secondary | ICD-10-CM | POA: Diagnosis not present

## 2022-03-09 DIAGNOSIS — M5033 Other cervical disc degeneration, cervicothoracic region: Secondary | ICD-10-CM | POA: Diagnosis not present

## 2022-03-09 DIAGNOSIS — D631 Anemia in chronic kidney disease: Secondary | ICD-10-CM | POA: Insufficient documentation

## 2022-03-09 DIAGNOSIS — M9903 Segmental and somatic dysfunction of lumbar region: Secondary | ICD-10-CM | POA: Diagnosis not present

## 2022-03-09 LAB — HEMOGLOBIN AND HEMATOCRIT, BLOOD
HCT: 34.2 % — ABNORMAL LOW (ref 39.0–52.0)
Hemoglobin: 10.4 g/dL — ABNORMAL LOW (ref 13.0–17.0)

## 2022-03-16 ENCOUNTER — Inpatient Hospital Stay: Payer: Medicare Other

## 2022-03-23 ENCOUNTER — Inpatient Hospital Stay: Payer: Medicare Other

## 2022-03-23 VITALS — BP 147/72 | HR 65

## 2022-03-23 DIAGNOSIS — D631 Anemia in chronic kidney disease: Secondary | ICD-10-CM | POA: Diagnosis not present

## 2022-03-23 DIAGNOSIS — D649 Anemia, unspecified: Secondary | ICD-10-CM

## 2022-03-23 DIAGNOSIS — D7581 Myelofibrosis: Secondary | ICD-10-CM | POA: Diagnosis not present

## 2022-03-23 DIAGNOSIS — N183 Chronic kidney disease, stage 3 unspecified: Secondary | ICD-10-CM

## 2022-03-23 DIAGNOSIS — D473 Essential (hemorrhagic) thrombocythemia: Secondary | ICD-10-CM | POA: Diagnosis not present

## 2022-03-23 LAB — HEMOGLOBIN AND HEMATOCRIT, BLOOD
HCT: 30.6 % — ABNORMAL LOW (ref 39.0–52.0)
Hemoglobin: 9.5 g/dL — ABNORMAL LOW (ref 13.0–17.0)

## 2022-03-23 MED ORDER — EPOETIN ALFA-EPBX 40000 UNIT/ML IJ SOLN
40000.0000 [IU] | Freq: Once | INTRAMUSCULAR | Status: AC
  Administered 2022-03-23: 40000 [IU] via SUBCUTANEOUS
  Filled 2022-03-23: qty 1

## 2022-03-23 MED ORDER — EPOETIN ALFA-EPBX 20000 UNIT/ML IJ SOLN: 50000.0000 [IU] | Freq: Once | INTRAMUSCULAR | Status: DC

## 2022-03-23 MED ORDER — EPOETIN ALFA-EPBX 10000 UNIT/ML IJ SOLN
10000.0000 [IU] | Freq: Once | INTRAMUSCULAR | Status: AC
  Administered 2022-03-23: 10000 [IU] via SUBCUTANEOUS
  Filled 2022-03-23: qty 1

## 2022-03-30 ENCOUNTER — Inpatient Hospital Stay: Payer: Medicare Other

## 2022-03-30 VITALS — BP 157/70 | HR 72

## 2022-03-30 DIAGNOSIS — D631 Anemia in chronic kidney disease: Secondary | ICD-10-CM

## 2022-03-30 DIAGNOSIS — D7581 Myelofibrosis: Secondary | ICD-10-CM | POA: Diagnosis not present

## 2022-03-30 DIAGNOSIS — M9901 Segmental and somatic dysfunction of cervical region: Secondary | ICD-10-CM | POA: Diagnosis not present

## 2022-03-30 DIAGNOSIS — D649 Anemia, unspecified: Secondary | ICD-10-CM

## 2022-03-30 DIAGNOSIS — M9903 Segmental and somatic dysfunction of lumbar region: Secondary | ICD-10-CM | POA: Diagnosis not present

## 2022-03-30 DIAGNOSIS — M5033 Other cervical disc degeneration, cervicothoracic region: Secondary | ICD-10-CM | POA: Diagnosis not present

## 2022-03-30 DIAGNOSIS — M5412 Radiculopathy, cervical region: Secondary | ICD-10-CM | POA: Diagnosis not present

## 2022-03-30 DIAGNOSIS — D473 Essential (hemorrhagic) thrombocythemia: Secondary | ICD-10-CM | POA: Diagnosis not present

## 2022-03-30 DIAGNOSIS — N183 Chronic kidney disease, stage 3 unspecified: Secondary | ICD-10-CM | POA: Diagnosis not present

## 2022-03-30 LAB — HEMOGLOBIN AND HEMATOCRIT, BLOOD
HCT: 31.8 % — ABNORMAL LOW (ref 39.0–52.0)
Hemoglobin: 9.8 g/dL — ABNORMAL LOW (ref 13.0–17.0)

## 2022-03-30 MED ORDER — EPOETIN ALFA-EPBX 40000 UNIT/ML IJ SOLN
40000.0000 [IU] | Freq: Once | INTRAMUSCULAR | Status: AC
  Administered 2022-03-30: 40000 [IU] via SUBCUTANEOUS
  Filled 2022-03-30: qty 1

## 2022-03-30 MED ORDER — EPOETIN ALFA-EPBX 10000 UNIT/ML IJ SOLN
10000.0000 [IU] | Freq: Once | INTRAMUSCULAR | Status: AC
  Administered 2022-03-30: 10000 [IU] via SUBCUTANEOUS
  Filled 2022-03-30: qty 1

## 2022-03-30 MED ORDER — EPOETIN ALFA-EPBX 20000 UNIT/ML IJ SOLN: 50000.0000 [IU] | Freq: Once | INTRAMUSCULAR | Status: DC

## 2022-04-06 ENCOUNTER — Inpatient Hospital Stay: Payer: Medicare Other | Attending: Oncology

## 2022-04-06 ENCOUNTER — Inpatient Hospital Stay: Payer: Medicare Other

## 2022-04-06 VITALS — BP 147/74 | HR 76

## 2022-04-06 DIAGNOSIS — D7581 Myelofibrosis: Secondary | ICD-10-CM | POA: Insufficient documentation

## 2022-04-06 DIAGNOSIS — D649 Anemia, unspecified: Secondary | ICD-10-CM

## 2022-04-06 DIAGNOSIS — N183 Chronic kidney disease, stage 3 unspecified: Secondary | ICD-10-CM | POA: Insufficient documentation

## 2022-04-06 DIAGNOSIS — D631 Anemia in chronic kidney disease: Secondary | ICD-10-CM | POA: Insufficient documentation

## 2022-04-06 DIAGNOSIS — D473 Essential (hemorrhagic) thrombocythemia: Secondary | ICD-10-CM | POA: Insufficient documentation

## 2022-04-06 LAB — HEMOGLOBIN AND HEMATOCRIT, BLOOD
HCT: 29.7 % — ABNORMAL LOW (ref 39.0–52.0)
Hemoglobin: 9.2 g/dL — ABNORMAL LOW (ref 13.0–17.0)

## 2022-04-06 MED ORDER — EPOETIN ALFA-EPBX 40000 UNIT/ML IJ SOLN
40000.0000 [IU] | Freq: Once | INTRAMUSCULAR | Status: AC
  Administered 2022-04-06: 40000 [IU] via SUBCUTANEOUS
  Filled 2022-04-06: qty 1

## 2022-04-06 MED ORDER — EPOETIN ALFA-EPBX 10000 UNIT/ML IJ SOLN
10000.0000 [IU] | Freq: Once | INTRAMUSCULAR | Status: AC
  Administered 2022-04-06: 10000 [IU] via SUBCUTANEOUS
  Filled 2022-04-06: qty 1

## 2022-04-06 MED ORDER — EPOETIN ALFA-EPBX 20000 UNIT/ML IJ SOLN: 50000.0000 [IU] | Freq: Once | INTRAMUSCULAR | Status: DC

## 2022-04-12 ENCOUNTER — Other Ambulatory Visit: Payer: Self-pay | Admitting: *Deleted

## 2022-04-12 ENCOUNTER — Other Ambulatory Visit: Payer: Self-pay

## 2022-04-12 ENCOUNTER — Encounter (HOSPITAL_COMMUNITY): Payer: Self-pay

## 2022-04-12 ENCOUNTER — Emergency Department (HOSPITAL_COMMUNITY)
Admission: EM | Admit: 2022-04-12 | Discharge: 2022-04-13 | Disposition: A | Discharge status: Home / Self Care | Payer: Medicare Other | Attending: Emergency Medicine | Admitting: Emergency Medicine

## 2022-04-12 ENCOUNTER — Emergency Department (HOSPITAL_COMMUNITY): Payer: Medicare Other

## 2022-04-12 ENCOUNTER — Encounter (INDEPENDENT_AMBULATORY_CARE_PROVIDER_SITE_OTHER): Payer: Medicare Other | Admitting: Ophthalmology

## 2022-04-12 DIAGNOSIS — Z85820 Personal history of malignant melanoma of skin: Secondary | ICD-10-CM | POA: Diagnosis not present

## 2022-04-12 DIAGNOSIS — I6529 Occlusion and stenosis of unspecified carotid artery: Secondary | ICD-10-CM | POA: Insufficient documentation

## 2022-04-12 DIAGNOSIS — Z8673 Personal history of transient ischemic attack (TIA), and cerebral infarction without residual deficits: Secondary | ICD-10-CM | POA: Diagnosis not present

## 2022-04-12 DIAGNOSIS — H43813 Vitreous degeneration, bilateral: Secondary | ICD-10-CM | POA: Diagnosis not present

## 2022-04-12 DIAGNOSIS — I639 Cerebral infarction, unspecified: Secondary | ICD-10-CM | POA: Diagnosis not present

## 2022-04-12 DIAGNOSIS — H35371 Puckering of macula, right eye: Secondary | ICD-10-CM

## 2022-04-12 DIAGNOSIS — N289 Disorder of kidney and ureter, unspecified: Secondary | ICD-10-CM | POA: Insufficient documentation

## 2022-04-12 DIAGNOSIS — I10 Essential (primary) hypertension: Secondary | ICD-10-CM | POA: Diagnosis not present

## 2022-04-12 DIAGNOSIS — D649 Anemia, unspecified: Secondary | ICD-10-CM

## 2022-04-12 DIAGNOSIS — M47812 Spondylosis without myelopathy or radiculopathy, cervical region: Secondary | ICD-10-CM | POA: Diagnosis not present

## 2022-04-12 DIAGNOSIS — I6523 Occlusion and stenosis of bilateral carotid arteries: Secondary | ICD-10-CM | POA: Diagnosis not present

## 2022-04-12 DIAGNOSIS — Z7902 Long term (current) use of antithrombotics/antiplatelets: Secondary | ICD-10-CM | POA: Insufficient documentation

## 2022-04-12 DIAGNOSIS — J341 Cyst and mucocele of nose and nasal sinus: Secondary | ICD-10-CM | POA: Diagnosis not present

## 2022-04-12 DIAGNOSIS — H538 Other visual disturbances: Secondary | ICD-10-CM | POA: Diagnosis present

## 2022-04-12 DIAGNOSIS — H5462 Unqualified visual loss, left eye, normal vision right eye: Secondary | ICD-10-CM | POA: Insufficient documentation

## 2022-04-12 DIAGNOSIS — Z79899 Other long term (current) drug therapy: Secondary | ICD-10-CM | POA: Insufficient documentation

## 2022-04-12 DIAGNOSIS — D7581 Myelofibrosis: Secondary | ICD-10-CM

## 2022-04-12 DIAGNOSIS — D500 Iron deficiency anemia secondary to blood loss (chronic): Secondary | ICD-10-CM | POA: Insufficient documentation

## 2022-04-12 DIAGNOSIS — R29818 Other symptoms and signs involving the nervous system: Secondary | ICD-10-CM | POA: Diagnosis not present

## 2022-04-12 LAB — URINALYSIS, ROUTINE W REFLEX MICROSCOPIC
Bilirubin Urine: NEGATIVE
Glucose, UA: NEGATIVE mg/dL
Hgb urine dipstick: NEGATIVE
Ketones, ur: NEGATIVE mg/dL
Leukocytes,Ua: NEGATIVE
Nitrite: NEGATIVE
Protein, ur: >=300 mg/dL — AB
Specific Gravity, Urine: 1.022 (ref 1.005–1.030)
pH: 5 (ref 5.0–8.0)

## 2022-04-12 LAB — DIFFERENTIAL
Abs Immature Granulocytes: 0.57 10*3/uL — ABNORMAL HIGH (ref 0.00–0.07)
Basophils Absolute: 0.1 10*3/uL (ref 0.0–0.1)
Basophils Relative: 1 %
Eosinophils Absolute: 0 10*3/uL (ref 0.0–0.5)
Eosinophils Relative: 0 %
Immature Granulocytes: 13 %
Lymphocytes Relative: 15 %
Lymphs Abs: 0.7 10*3/uL (ref 0.7–4.0)
Monocytes Absolute: 0.7 10*3/uL (ref 0.1–1.0)
Monocytes Relative: 17 %
Neutro Abs: 2.4 10*3/uL (ref 1.7–7.7)
Neutrophils Relative %: 54 %

## 2022-04-12 LAB — I-STAT CHEM 8, ED
BUN: 25 mg/dL — ABNORMAL HIGH (ref 8–23)
Calcium, Ion: 1.07 mmol/L — ABNORMAL LOW (ref 1.15–1.40)
Chloride: 111 mmol/L (ref 98–111)
Creatinine, Ser: 1.8 mg/dL — ABNORMAL HIGH (ref 0.61–1.24)
Glucose, Bld: 89 mg/dL (ref 70–99)
HCT: 30 % — ABNORMAL LOW (ref 39.0–52.0)
Hemoglobin: 10.2 g/dL — ABNORMAL LOW (ref 13.0–17.0)
Potassium: 4.8 mmol/L (ref 3.5–5.1)
Sodium: 141 mmol/L (ref 135–145)
TCO2: 21 mmol/L — ABNORMAL LOW (ref 22–32)

## 2022-04-12 LAB — ETHANOL: Alcohol, Ethyl (B): <10 mg/dL (ref <10)

## 2022-04-12 LAB — PROTIME-INR
INR: 1.1 (ref 0.8–1.2)
Prothrombin Time: 14.5 seconds (ref 11.4–15.2)

## 2022-04-12 LAB — COMPREHENSIVE METABOLIC PANEL
ALT: 20 U/L (ref 0–44)
AST: 25 U/L (ref 15–41)
Albumin: 3.7 g/dL (ref 3.5–5.0)
Alkaline Phosphatase: 77 U/L (ref 38–126)
Anion gap: 11 (ref 5–15)
BUN: 25 mg/dL — ABNORMAL HIGH (ref 8–23)
CO2: 21 mmol/L — ABNORMAL LOW (ref 22–32)
Calcium: 8.9 mg/dL (ref 8.9–10.3)
Chloride: 110 mmol/L (ref 98–111)
Creatinine, Ser: 1.76 mg/dL — ABNORMAL HIGH (ref 0.61–1.24)
GFR, Estimated: 38 mL/min — ABNORMAL LOW (ref >60)
Glucose, Bld: 93 mg/dL (ref 70–99)
Potassium: 4.9 mmol/L (ref 3.5–5.1)
Sodium: 142 mmol/L (ref 135–145)
Total Bilirubin: 0.9 mg/dL (ref 0.3–1.2)
Total Protein: 6 g/dL — ABNORMAL LOW (ref 6.5–8.1)

## 2022-04-12 LAB — CBC
HCT: 34 % — ABNORMAL LOW (ref 39.0–52.0)
Hemoglobin: 10.2 g/dL — ABNORMAL LOW (ref 13.0–17.0)
MCH: 27.8 pg (ref 26.0–34.0)
MCHC: 30 g/dL (ref 30.0–36.0)
MCV: 92.6 fL (ref 80.0–100.0)
Platelets: 263 10*3/uL (ref 150–400)
RBC: 3.67 MIL/uL — ABNORMAL LOW (ref 4.22–5.81)
RDW: 19.5 % — ABNORMAL HIGH (ref 11.5–15.5)
WBC: 4.5 10*3/uL (ref 4.0–10.5)
nRBC: 4.7 % — ABNORMAL HIGH (ref 0.0–0.2)

## 2022-04-12 LAB — APTT: aPTT: 35 seconds (ref 24–36)

## 2022-04-12 MED ORDER — IOHEXOL 350 MG/ML SOLN
65.0000 mL | Freq: Once | INTRAVENOUS | Status: AC | PRN
  Administered 2022-04-12: 65 mL via INTRAVENOUS

## 2022-04-12 NOTE — ED Triage Notes (Addendum)
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 Denies weakness unilateral.  Reports had trouble with gait last week.

## 2022-04-12 NOTE — ED Provider Triage Note (Signed)
Emergency Medicine Provider Triage Evaluation Note  [name] , a 81 y.o. male  was evaluated in triage.  Pt complains of visual changes in left eye starting yesterday when he woke up.  Seen by Dr. 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  History of same for 10 years ago,.  Review of Systems  Per HPI  Physical Exam  BP (!) 150/80 (BP Location: Right Arm)   Pulse 62   Temp (!) 97.4 F (36.3 C) (Oral)   Resp 16   Ht '5\' 10"'$  (1.778 m)   Wt 86.2 kg   SpO2 98%   BMI 27.26 kg/m  Gen:   Awake, no distress   Resp:  Normal effort  MSK:   Moves extremities without difficulty  Other:  Follows commands, no dysarthria.  Medical Decision Making  Medically screening exam initiated at 5:02 PM.  Appropriate orders placed.  RIVAN SIORDIA was informed that the remainder of the evaluation will be completed by another provider, this initial triage assessment does not replace that evaluation, and the importance of remaining in the ED until their evaluation is complete.  Spoke with Sal with neurology, orders in.  They will add to the list to be seen.  Not a code stroke, LVO negative.   Sherrill Raring, PA-C 04/12/22 1703

## 2022-04-13 ENCOUNTER — Inpatient Hospital Stay: Payer: Medicare Other

## 2022-04-13 DIAGNOSIS — N183 Chronic kidney disease, stage 3 unspecified: Secondary | ICD-10-CM | POA: Diagnosis not present

## 2022-04-13 DIAGNOSIS — D631 Anemia in chronic kidney disease: Secondary | ICD-10-CM | POA: Diagnosis not present

## 2022-04-13 DIAGNOSIS — D7581 Myelofibrosis: Secondary | ICD-10-CM

## 2022-04-13 DIAGNOSIS — D473 Essential (hemorrhagic) thrombocythemia: Secondary | ICD-10-CM | POA: Diagnosis not present

## 2022-04-13 LAB — HEMOGLOBIN AND HEMATOCRIT, BLOOD
HCT: 33.5 % — ABNORMAL LOW (ref 39.0–52.0)
Hemoglobin: 10.3 g/dL — ABNORMAL LOW (ref 13.0–17.0)

## 2022-04-13 NOTE — Progress Notes (Signed)
Hbg 10.3 today. No Retacrit today

## 2022-04-13 NOTE — ED Provider Notes (Signed)
[facility] EMERGENCY DEPARTMENT Provider Note   CSN: [id number] Arrival date & time: 04/12/22  1634     History  Chief Complaint  Patient presents with   Visual Field Change    [name] is a 81 y.o. male.  The history is provided by the patient.  He has history of hypertension, hyperlipidemia, stroke, melanoma, myelofibrosis and comes in complaining of decreased vision in his left eye which started on the morning of 04/11/2022.  He does state that vision seems to be improving slightly since then.  He denies headache.  Denies any weakness or numbness.  He saw his eye doctor on 04/12/2022 who told him to come to the emergency department to rule out ocular stroke.  He does have history of prior stroke without residual deficits.   Home Medications Prior to Admission medications   Medication Sig Start Date End Date Taking? Authorizing Provider  albuterol (VENTOLIN HFA) 108 (90 Base) MCG/ACT inhaler Inhale 2 puffs into the lungs every 6 (six) hours as needed for wheezing or shortness of breath. 09/15/21   Johnson, Megan P, DO  amLODipine (NORVASC) 10 MG tablet Take 1 tablet (10 mg total) by mouth daily. In the evening 05/17/21   Minna Merritts, MD  azelastine (ASTELIN) 0.1 % nasal spray Place 1 spray into both nostrils 2 (two) times daily. Use in each nostril as directed 03/28/19   Volney American, PA-C  calcium carbonate (OSCAL) 1500 (600 Ca) MG TABS tablet Take by mouth.    [provider]  chlorpheniramine-HYDROcodone (TUSSIONEX) 10-8 MG/5ML Take 5 mLs by mouth at bedtime as needed for cough. 02/01/22   Johnson, Megan P, DO  Cholecalciferol (D3-1000 PO) Take by mouth daily.    [provider]  clopidogrel (PLAVIX) 75 MG tablet Take 1 tablet (75 mg total) by mouth daily. 01/20/22   Johnson, Megan P, DO  DULoxetine (CYMBALTA) 20 MG capsule TAKE 1 CAPSULE BY MOUTH DAILY. 01/20/22   Johnson, Megan P, DO  gabapentin (NEURONTIN) 300 MG capsule Take  1 tab in the AM and 2 tabs at bedtime. 01/20/22   Johnson, Megan P, DO  Ginsengs-Saw Palmetto (MULTI GINSENG & SAW PALMETTO) 500 MG CAPS Take 500 mg by mouth daily.    [provider]  losartan (COZAAR) 100 MG tablet Take 1 tablet (100 mg total) by mouth in the morning. 05/17/21   Minna Merritts, MD  meclizine (ANTIVERT) 25 MG tablet TAKE 1 TABLET BY MOUTH 3 TIMES DAILY AS NEEDED FOR DIZZINESS 01/05/22   Park Liter P, DO  Multiple Vitamin (MULITIVITAMIN WITH MINERALS) TABS Take 1 tablet by mouth daily.    [provider]  Naproxen Sodium 220 MG CAPS Take by mouth as needed.    [provider]  omeprazole (PRILOSEC) 40 MG capsule Take 1 capsule (40 mg total) by mouth daily. 01/20/22   Johnson, Megan P, DO  predniSONE (DELTASONE) 50 MG tablet Take 1 tablet (50 mg total) by mouth daily with breakfast. 02/01/22   Park Liter P, DO  simvastatin (ZOCOR) 40 MG tablet Take 1 tablet (40 mg total) by mouth daily. 01/20/22   Johnson, Megan P, DO  tadalafil (CIALIS) 5 MG tablet Take 1 tablet (5 mg total) by mouth daily. 11/02/21   Johnson, Megan P, DO  tamsulosin (FLOMAX) 0.4 MG CAPS capsule Take 2 capsules (0.8 mg total) by mouth daily. 01/20/22   Park Liter P, DO  zinc gluconate 50 MG tablet Take by mouth.  [provider]      Allergies    Meloxicam    Review of Systems   Review of Systems  All other systems reviewed and are negative.   Physical Exam Updated Vital Signs BP (!) 149/83   Pulse 66   Temp 97.6 F (36.4 C)   Resp 15   Ht '5\' 10"'$  (1.778 m)   Wt 86.2 kg   SpO2 99%   BMI 27.26 kg/m  Physical Exam Vitals and nursing note reviewed.   81 year old male, resting comfortably and in no acute distress. Vital signs are significant for elevated blood pressure. Oxygen saturation is 99%, which is normal. Head is normocephalic and atraumatic. PERRLA, EOMI. Oropharynx is clear. Neck is nontender and supple without adenopathy or JVD. Back is  nontender and there is no CVA tenderness. Lungs are clear without rales, wheezes, or rhonchi. Chest is nontender. Heart has regular rate and rhythm without murmur. Abdomen is soft, flat, nontender without masses or hepatosplenomegaly and peristalsis is normoactive. Extremities have no cyanosis or edema, full range of motion is present. Skin is warm and dry without rash. Neurologic: Mental status is normal, cranial nerves are intact, strength is 5/5 in all 4 extremities, there is no pronator drift, her no sensory deficits.  ED Results / Procedures / Treatments   Labs (all labs ordered are listed, but only abnormal results are displayed) Labs Reviewed  CBC - Abnormal; Notable for the following components:      Result Value   RBC 3.67 (*)    Hemoglobin 10.2 (*)    HCT 34.0 (*)    RDW 19.5 (*)    nRBC 4.7 (*)    All other components within normal limits  DIFFERENTIAL - Abnormal; Notable for the following components:   Abs Immature Granulocytes 0.57 (*)    All other components within normal limits  COMPREHENSIVE METABOLIC PANEL - Abnormal; Notable for the following components:   CO2 21 (*)    BUN 25 (*)    Creatinine, Ser 1.76 (*)    Total Protein 6.0 (*)    GFR, Estimated 38 (*)    All other components within normal limits  URINALYSIS, ROUTINE W REFLEX MICROSCOPIC - Abnormal; Notable for the following components:   Color, Urine AMBER (*)    APPearance CLOUDY (*)    Protein, ur >=300 (*)    Bacteria, UA RARE (*)    All other components within normal limits  I-STAT CHEM 8, ED - Abnormal; Notable for the following components:   BUN 25 (*)    Creatinine, Ser 1.80 (*)    Calcium, Ion 1.07 (*)    TCO2 21 (*)    Hemoglobin 10.2 (*)    HCT 30.0 (*)    All other components within normal limits  ETHANOL  PROTIME-INR  APTT  RAPID URINE DRUG SCREEN, HOSP PERFORMED  PATHOLOGIST SMEAR REVIEW    EKG EKG Interpretation  Date/Time:  Tuesday April 12 2022 16:42:01 EST Ventricular  Rate:  71 PR Interval:  240 QRS Duration: 88 QT Interval:  400 QTC Calculation: 434 R Axis:   47 Text Interpretation: Sinus rhythm with 1st degree A-V block with Premature supraventricular complexes Minimal voltage criteria for LVH, may be normal variant ( Sokolow-Lyon ) Septal infarct , age undetermined Abnormal ECG When compared with ECG of 09-Nov-2015 16:26, Premature atrial complexes are now present Confirmed by Delora Fuel ([id number]) on 04/12/2022 11:30:15 PM  Radiology CT ANGIO HEAD NECK W WO CM  Result  Date: 04/12/2022 CLINICAL DATA:  Stroke workup EXAM: CT ANGIOGRAPHY HEAD AND NECK TECHNIQUE: Multidetector CT imaging of the head and neck was performed using the standard protocol during bolus administration of intravenous contrast. Multiplanar CT image reconstructions and MIPs were obtained to evaluate the vascular anatomy. Carotid stenosis measurements (when applicable) are obtained utilizing NASCET criteria, using the distal internal carotid diameter as the denominator. RADIATION DOSE REDUCTION: This exam was performed according to the departmental dose-optimization program which includes automated exposure control, adjustment of the mA and/or kV according to patient size and/or use of iterative reconstruction technique. CONTRAST:  38m OMNIPAQUE IOHEXOL 350 MG/ML SOLN COMPARISON:  CT head 10/12/2021, MRI head 04/12/2022, MRA head 11/20/2007, no prior CTA head or neck. FINDINGS: CT HEAD FINDINGS Brain: No evidence of acute infarct, hemorrhage, mass, mass effect, or midline shift. No hydrocephalus or extra-axial fluid collection. Vascular: No hyperdense vessel. Skull: Negative for fracture or focal lesion. Sinuses/Orbits: Mucous retention cysts in the right maxillary sinus. Mild mucosal thickening in the left maxillary sinus and ethmoid air cells. Status post bilateral lens replacements. Other: The mastoid air cells are well aerated. CTA NECK FINDINGS Aortic arch: 4 vessel arch, with the origin of  the left vertebral artery coming from the aorta. Mild aortic atherosclerosis. Imaged portion shows no evidence of aneurysm or dissection. No significant stenosis of the major arch vessel origins. Right carotid system: No evidence of dissection, occlusion, or hemodynamically significant stenosis (greater than 50%). Left carotid system: No evidence of dissection, occlusion, or hemodynamically significant stenosis (greater than 50%). Vertebral arteries: No evidence of dissection, occlusion, or hemodynamically significant stenosis (greater than 50%). Skeleton: Increased osseous density, favored to be metabolic. No acute fracture or suspicious lesion. Degenerative changes in the cervical spine. Poor dentition. Other neck: Peripherally calcified 1.2 cm lesion in the right thyroid lobe, for which no follow-up is currently indicated. (Reference: J Am Coll Radiol. 2015 Feb;12(2): 143-50) Upper chest: No focal pulmonary opacity or pleural effusion. Review of the MIP images confirms the above findings CTA HEAD FINDINGS Anterior circulation: Both internal carotid arteries are patent to the termini, without significant stenosis. A1 segments patent. Normal anterior communicating artery. Anterior cerebral arteries are patent to their distal aspects. No M1 stenosis or occlusion. MCA branches perfused and symmetric. Posterior circulation: Vertebral arteries patent to the vertebrobasilar junction without stenosis. Posterior inferior cerebellar arteries patent proximally. Basilar patent to its distal aspect. Superior cerebellar arteries patent proximally. Patent P1 segments. PCAs perfused to their distal aspects without stenosis. The left posterior communicating artery is not visualized. The right posterior communicating artery is diminutive but patent. Venous sinuses: Patent. Anatomic variants: None significant. Review of the MIP images confirms the above findings IMPRESSION: 1. No acute intracranial process. 2. No intracranial large  vessel occlusion or significant stenosis. 3. No hemodynamically significant stenosis in the neck. 4. Aortic atherosclerosis. Aortic Atherosclerosis (ICD10-I70.0). Electronically Signed   By: AMerilyn BabaM.D.   On: 04/12/2022 20:47   MR BRAIN WO CONTRAST  Result Date: 04/12/2022 CLINICAL DATA:  Stroke follow-up EXAM: MRI HEAD WITHOUT CONTRAST TECHNIQUE: Multiplanar, multiecho pulse sequences of the brain and surrounding structures were obtained without intravenous contrast. COMPARISON:  MRI head 04/29/2019; correlation is also made with 10/12/2021 CT head FINDINGS: Brain: No restricted diffusion to suggest acute or subacute infarct. No acute hemorrhage, mass, mass effect, or midline shift. No hemosiderin deposition to suggest remote hemorrhage. No hydrocephalus or extra-axial collection. Scattered T2 hyperintense signal in the periventricular white matter, likely the sequela of mild  chronic small vessel ischemic disease. Cerebral volume is within normal limits for age. Vascular: Normal arterial flow voids. Skull and upper cervical spine: Normal marrow signal. Sinuses/Orbits: Mucous retention cysts in the right maxillary sinus. Status post bilateral lens replacements. Additional postsurgical changes in the right globe. Other: The mastoids are well aerated. IMPRESSION: No acute intracranial process. No evidence of acute or subacute infarct. Electronically Signed   By: Merilyn Baba M.D.   On: 04/12/2022 19:36    Procedures Procedures    Medications Ordered in ED Medications  iohexol (OMNIPAQUE) 350 MG/ML injection 65 mL (65 mLs Intravenous Contrast Given 04/12/22 2022)    ED Course/ Medical Decision Making/ A&P                           Medical Decision Making  Monocular vision loss concerning for possible ocular stroke.  Also consider vitreous hemorrhage, retinal detachment.  However, he had been evaluated by his ophthalmologist with no report of vitreous hemorrhage or retinal detachment.  I have  reviewed and interpreted his laboratory test, and my interpretation is stable renal insufficiency, mild anemia which is actually improved over baseline, proteinuria.  CT angiogram of the head and neck showed no acute intracranial process, no significant vessel occlusions or stenoses.  MRI of the brain showed no evidence of stroke.  I have independently viewed all of these images, and agree with radiologist's interpretation.  Cause for his vision loss is unclear.  However, no evidence of stroke, no indication for hospital admission.  I am referring him back to his ophthalmologist for further outpatient work-up.  Final Clinical Impression(s) / ED Diagnoses Final diagnoses:  Vision loss, left eye  Elevated blood pressure reading with diagnosis of hypertension  Renal insufficiency  Normochromic normocytic anemia    Rx / DC Orders ED Discharge Orders     None         Delora Fuel, MD 36/14/43 [phone]

## 2022-04-13 NOTE — Discharge Instructions (Signed)
Your today did not show any sign of a stroke.  Please follow-up with your ophthalmologist for further evaluation regarding why the vision in your left eye got worse.

## 2022-04-14 LAB — PATHOLOGIST SMEAR REVIEW

## 2022-04-19 ENCOUNTER — Encounter (INDEPENDENT_AMBULATORY_CARE_PROVIDER_SITE_OTHER): Payer: Medicare Other | Admitting: Ophthalmology

## 2022-04-19 ENCOUNTER — Other Ambulatory Visit: Payer: Self-pay | Admitting: Cardiovascular Disease

## 2022-04-19 ENCOUNTER — Other Ambulatory Visit: Payer: Self-pay

## 2022-04-19 DIAGNOSIS — H33302 Unspecified retinal break, left eye: Secondary | ICD-10-CM | POA: Diagnosis not present

## 2022-04-19 DIAGNOSIS — D649 Anemia, unspecified: Secondary | ICD-10-CM

## 2022-04-20 ENCOUNTER — Inpatient Hospital Stay: Payer: Medicare Other

## 2022-04-20 DIAGNOSIS — D649 Anemia, unspecified: Secondary | ICD-10-CM

## 2022-04-20 DIAGNOSIS — N183 Chronic kidney disease, stage 3 unspecified: Secondary | ICD-10-CM | POA: Diagnosis not present

## 2022-04-20 DIAGNOSIS — D631 Anemia in chronic kidney disease: Secondary | ICD-10-CM | POA: Diagnosis not present

## 2022-04-20 DIAGNOSIS — D7581 Myelofibrosis: Secondary | ICD-10-CM | POA: Diagnosis not present

## 2022-04-20 DIAGNOSIS — D473 Essential (hemorrhagic) thrombocythemia: Secondary | ICD-10-CM | POA: Diagnosis not present

## 2022-04-20 LAB — HEMOGLOBIN AND HEMATOCRIT, BLOOD
HCT: 34.1 % — ABNORMAL LOW (ref 39.0–52.0)
Hemoglobin: 10.5 g/dL — ABNORMAL LOW (ref 13.0–17.0)

## 2022-04-20 NOTE — Progress Notes (Signed)
Hgb 10.5. no injection needed today. Pt made aware of test results

## 2022-04-26 ENCOUNTER — Other Ambulatory Visit: Payer: Self-pay

## 2022-04-26 DIAGNOSIS — D649 Anemia, unspecified: Secondary | ICD-10-CM

## 2022-04-27 ENCOUNTER — Encounter: Payer: Self-pay | Admitting: Oncology

## 2022-04-27 ENCOUNTER — Inpatient Hospital Stay (HOSPITAL_BASED_OUTPATIENT_CLINIC_OR_DEPARTMENT_OTHER): Payer: Medicare Other | Admitting: Oncology

## 2022-04-27 ENCOUNTER — Inpatient Hospital Stay: Payer: Medicare Other

## 2022-04-27 VITALS — BP 125/69 | HR 64 | Temp 98.0°F | Resp 18 | Wt 182.5 lb

## 2022-04-27 DIAGNOSIS — N183 Chronic kidney disease, stage 3 unspecified: Secondary | ICD-10-CM | POA: Diagnosis not present

## 2022-04-27 DIAGNOSIS — M5033 Other cervical disc degeneration, cervicothoracic region: Secondary | ICD-10-CM | POA: Diagnosis not present

## 2022-04-27 DIAGNOSIS — M9901 Segmental and somatic dysfunction of cervical region: Secondary | ICD-10-CM | POA: Diagnosis not present

## 2022-04-27 DIAGNOSIS — Z79899 Other long term (current) drug therapy: Secondary | ICD-10-CM | POA: Diagnosis not present

## 2022-04-27 DIAGNOSIS — D649 Anemia, unspecified: Secondary | ICD-10-CM | POA: Diagnosis not present

## 2022-04-27 DIAGNOSIS — M9903 Segmental and somatic dysfunction of lumbar region: Secondary | ICD-10-CM | POA: Diagnosis not present

## 2022-04-27 DIAGNOSIS — D473 Essential (hemorrhagic) thrombocythemia: Secondary | ICD-10-CM | POA: Diagnosis not present

## 2022-04-27 DIAGNOSIS — D6489 Other specified anemias: Secondary | ICD-10-CM

## 2022-04-27 DIAGNOSIS — D7581 Myelofibrosis: Secondary | ICD-10-CM | POA: Diagnosis not present

## 2022-04-27 DIAGNOSIS — D631 Anemia in chronic kidney disease: Secondary | ICD-10-CM | POA: Diagnosis not present

## 2022-04-27 DIAGNOSIS — M5412 Radiculopathy, cervical region: Secondary | ICD-10-CM | POA: Diagnosis not present

## 2022-04-27 LAB — COMPREHENSIVE METABOLIC PANEL
ALT: 19 U/L (ref 0–44)
AST: 19 U/L (ref 15–41)
Albumin: 3.7 g/dL (ref 3.5–5.0)
Alkaline Phosphatase: 71 U/L (ref 38–126)
Anion gap: 4 — ABNORMAL LOW (ref 5–15)
BUN: 48 mg/dL — ABNORMAL HIGH (ref 8–23)
CO2: 21 mmol/L — ABNORMAL LOW (ref 22–32)
Calcium: 8.5 mg/dL — ABNORMAL LOW (ref 8.9–10.3)
Chloride: 113 mmol/L — ABNORMAL HIGH (ref 98–111)
Creatinine, Ser: 1.93 mg/dL — ABNORMAL HIGH (ref 0.61–1.24)
GFR, Estimated: 34 mL/min — ABNORMAL LOW (ref >60)
Glucose, Bld: 118 mg/dL — ABNORMAL HIGH (ref 70–99)
Potassium: 6.1 mmol/L — ABNORMAL HIGH (ref 3.5–5.1)
Sodium: 138 mmol/L (ref 135–145)
Total Bilirubin: 0.7 mg/dL (ref 0.3–1.2)
Total Protein: 6 g/dL — ABNORMAL LOW (ref 6.5–8.1)

## 2022-04-27 LAB — CBC WITH DIFFERENTIAL/PLATELET
Abs Immature Granulocytes: 0.39 10*3/uL — ABNORMAL HIGH (ref 0.00–0.07)
Basophils Absolute: 0 10*3/uL (ref 0.0–0.1)
Basophils Relative: 1 %
Eosinophils Absolute: 0 10*3/uL (ref 0.0–0.5)
Eosinophils Relative: 0 %
HCT: 31.6 % — ABNORMAL LOW (ref 39.0–52.0)
Hemoglobin: 9.7 g/dL — ABNORMAL LOW (ref 13.0–17.0)
Immature Granulocytes: 13 %
Lymphocytes Relative: 22 %
Lymphs Abs: 0.7 10*3/uL (ref 0.7–4.0)
MCH: 27.6 pg (ref 26.0–34.0)
MCHC: 30.7 g/dL (ref 30.0–36.0)
MCV: 90 fL (ref 80.0–100.0)
Monocytes Absolute: 0.5 10*3/uL (ref 0.1–1.0)
Monocytes Relative: 15 %
Neutro Abs: 1.5 10*3/uL — ABNORMAL LOW (ref 1.7–7.7)
Neutrophils Relative %: 49 %
Platelets: 189 10*3/uL (ref 150–400)
RBC: 3.51 MIL/uL — ABNORMAL LOW (ref 4.22–5.81)
RDW: 18.6 % — ABNORMAL HIGH (ref 11.5–15.5)
Smear Review: NORMAL
WBC: 3 10*3/uL — ABNORMAL LOW (ref 4.0–10.5)
nRBC: 0.7 % — ABNORMAL HIGH (ref 0.0–0.2)

## 2022-04-27 LAB — IRON AND TIBC
Iron: 100 ug/dL (ref 45–182)
Saturation Ratios: 37 % (ref 17.9–39.5)
TIBC: 273 ug/dL (ref 250–450)
UIBC: 173 ug/dL

## 2022-04-27 LAB — LACTATE DEHYDROGENASE: LDH: 374 U/L — ABNORMAL HIGH (ref 98–192)

## 2022-04-27 LAB — FERRITIN: Ferritin: 173 ng/mL (ref 24–336)

## 2022-04-27 MED ORDER — EPOETIN ALFA-EPBX 10000 UNIT/ML IJ SOLN
10000.0000 [IU] | Freq: Once | INTRAMUSCULAR | Status: AC
  Filled 2022-04-27: qty 1

## 2022-04-27 MED ORDER — EPOETIN ALFA-EPBX 40000 UNIT/ML IJ SOLN
40000.0000 [IU] | Freq: Once | INTRAMUSCULAR | Status: AC
  Filled 2022-04-27: qty 1

## 2022-04-27 MED ORDER — EPOETIN ALFA-EPBX 20000 UNIT/ML IJ SOLN: 50000.0000 [IU] | Freq: Once | INTRAMUSCULAR | Status: DC

## 2022-04-27 MED ORDER — EPOETIN ALFA-EPBX 40000 UNIT/ML IJ SOLN
40000.0000 [IU] | Freq: Once | INTRAMUSCULAR | Status: DC
  Administered 2022-04-27: 40000 [IU] via SUBCUTANEOUS

## 2022-04-27 MED ORDER — EPOETIN ALFA-EPBX 10000 UNIT/ML IJ SOLN
10000.0000 [IU] | Freq: Once | INTRAMUSCULAR | Status: DC
  Administered 2022-04-27: 10000 [IU] via SUBCUTANEOUS

## 2022-04-27 NOTE — Progress Notes (Signed)
   Hematology/Oncology Consult note [facility]  [phone] [phone]  Patient Care Team: Johnson, Megan P, DO as PCP - General (Family Medicine) Arcasoy, Murat Osman, MD as Referring Physician (Hematology) Matthews, John D, MD as Consulting Physician (Ophthalmology) Sethi, Pramod S, MD as Consulting Physician (Neurology) Eskridge, Matthew, MD as Consulting Physician (Urology) Gollan, Timothy J, MD as Consulting Physician (Cardiology) Stoioff, Scott C, MD (Urology) Rao, Archana C, MD as Consulting Physician (Hematology and Oncology)   Name of the patient: [name]  [id number]  01/16/1941   Date of visit: 04/27/22  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  At that time he was found to have a mild splenomegaly of 15.8 cm.DIPPS score is 3 (age 73- 1, hemoglobin less than 10- 2) and score of 4 if 1% circulating blasts included from 07/17/2014.   Bone marrow on 06/11/2014 was most consistent with primary myelofibrosis.  Bone marrow biopsy showed 1% abnormal cells: CD45+, CD5+, CD10, CD11c+/-, CD19+, CD2-+, (dim), CD22+ (dim, CD23+, CD38-/+, FMC7-, HLA-DR+, sig lambda+(dim).  Blasts were not increased 1.2%; hypercellular for age: 60%; JAK2 V617F mutation was negative.  CALR mutation positive.  Flow cytometry included about 1% CLL/SLL phenotype cells (CD5+) of uncertain significance and some infiltrate into the marrow with increased atypical megakaryocytes.  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  MDS FISH panel was negative.   Patient also follows up with [facility] benign hematology Dr. El Bitar for his anemia.  Patient's hemoglobin was drifting down to the eights and was started on EPO in October 2022.    Interval  history-patient is doing well for his age.  He remains independent of his ADLs and IADLs.  Energy levels have remained stable.  ECOG PS- 1 Pain scale- 0   Review of systems- Review of Systems  Constitutional:  Negative for chills, fever, malaise/fatigue and weight loss.  HENT:  Negative for congestion, ear discharge and nosebleeds.   Eyes:  Negative for blurred vision.  Respiratory:  Negative for cough, hemoptysis, sputum production, shortness of breath and wheezing.   Cardiovascular:  Negative for chest pain, palpitations, orthopnea and claudication.  Gastrointestinal:  Negative for abdominal pain, blood in stool, constipation, diarrhea, heartburn, melena, nausea and vomiting.  Genitourinary:  Negative for dysuria, flank pain, frequency, hematuria and urgency.  Musculoskeletal:  Negative for back pain, joint pain and myalgias.  Skin:  Negative for rash.  Neurological:  Negative for dizziness, tingling, focal weakness, seizures, weakness and headaches.  Endo/Heme/Allergies:  Does not bruise/bleed easily.  Psychiatric/Behavioral:  Negative for depression and suicidal ideas. The patient does not have insomnia.       Allergies  Allergen Reactions   Meloxicam Nausea And Vomiting     Past Medical History:  Diagnosis Date   Arthritis    Benign hypertensive renal disease    GERD (gastroesophageal reflux disease)    Heartburn    History of retinal detachment    Hyperlipidemia    Hypertension    Melanoma (HCC)    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 approximately 10-15 years ago   Myelofibrosis (HCC)    Prostate hypertrophy    Stroke (HCC) June 2009   R brain subcortical infarct     Past Surgical History:  Procedure Laterality Date   ASPIRATION / INJECTION RENAL CYST  Feb   2017   BACK SURGERY     approx 20- 25 years ago   GAS INSERTION  08/11/2011   Procedure: INSERTION OF GAS;  Surgeon: Hayden Pedro, MD;  Location: [place];  Service: Ophthalmology;  Laterality: Right;   [id number]   SCLERAL BUCKLE  08/11/2011   Procedure: SCLERAL BUCKLE;  Surgeon: Hayden Pedro, MD;  Location: [place];  Service: Ophthalmology;  Laterality: Right;   VARICOSE VEIN SURGERY      Social History   Socioeconomic History   Marital status: Married    Spouse name: Parke Simmers    Number of children: 2   Years of education: 12+   Highest education level: Some college, no degree  Occupational History   Occupation: Education officer, community: OTHER    Comment: community   Occupation: SELF EMPLOYED    Employer: SELF EMPLOYED  Tobacco Use   Smoking status: Former    Packs/day: 0.25    Types: Cigarettes    Quit date: 06/06/2010    Years since quitting: 11.8   Smokeless tobacco: Never  Vaping Use   Vaping Use: Never used  Substance and Sexual Activity   Alcohol use: No    Alcohol/week: 0.0 standard drinks of alcohol   Drug use: No   Sexual activity: Not Currently  Other Topics Concern   Not on file  Social History Narrative   Pt lives at home with his family.   Caffeine Use- 2 cups daily   Patient has 2 children.    Patient has some college.    Patient is right handed.          Works full time   Scientist, physiological Strain: Low Risk  (07/12/2021)   Overall Financial Resource Strain (CARDIA)    Difficulty of Paying Living Expenses: Not hard at all  Food Insecurity: No Food Insecurity (07/12/2021)   Hunger Vital Sign    Worried About Running Out of Food in the Last Year: Never true    [place] in the Last Year: Never true  Transportation Needs: No Transportation Needs (07/12/2021)   PRAPARE - Hydrologist (Medical): No    Lack of Transportation (Non-Medical): No  Physical Activity: Inactive (07/12/2021)   Exercise Vital Sign    Days of Exercise per Week: 0 days    Minutes of Exercise per Session: 0 min  Stress: No Stress Concern Present (07/12/2021)   [place]    Feeling of Stress : Not at all  Social Connections: [place] (07/12/2021)   Social Connection and Isolation Panel [NHANES]    Frequency of Communication with Friends and Family: Three times a week    Frequency of Social Gatherings with Friends and Family: More than three times a week    Attends Religious Services: More than 4 times per year    Active Member of [employer] or Organizations: Yes    Attends Music therapist: More than 4 times per year    Marital Status: Married  Human resources officer Violence: Not At Risk (07/12/2021)   Humiliation, Afraid, Rape, and Kick questionnaire    Fear of Current or Ex-Partner: No    Emotionally Abused: No    Physically Abused: No    Sexually Abused: No    Family History  Problem Relation Age of Onset   Heart disease Father    Diabetes Son  Kidney disease Neg Hx    Prostate cancer Neg Hx      Current Outpatient Medications:    amLODipine (NORVASC) 10 MG tablet, Take 1 tablet (10 mg total) by mouth daily. In the evening, Disp: 90 tablet, Rfl: 3   azelastine (ASTELIN) 0.1 % nasal spray, Place 1 spray into both nostrils 2 (two) times daily. Use in each nostril as directed, Disp: 30 mL, Rfl: 0   Cholecalciferol (D3-1000 PO), Take by mouth daily., Disp: , Rfl:    clopidogrel (PLAVIX) 75 MG tablet, Take 1 tablet (75 mg total) by mouth daily., Disp: 90 tablet, Rfl: 0   DULoxetine (CYMBALTA) 20 MG capsule, TAKE 1 CAPSULE BY MOUTH DAILY., Disp: 90 capsule, Rfl: 1   gabapentin (NEURONTIN) 300 MG capsule, Take 1 tab in the AM and 2 tabs at bedtime., Disp: 270 capsule, Rfl: 1   Ginsengs-Saw Palmetto (MULTI GINSENG & SAW PALMETTO) 500 MG CAPS, Take 500 mg by mouth daily., Disp: , Rfl:    losartan (COZAAR) 100 MG tablet, TAKE 1 TABLET BY MOUTH IN THE MORNING., Disp: 90 tablet, Rfl: 1   meclizine (ANTIVERT) 25 MG tablet, TAKE 1 TABLET BY MOUTH 3 TIMES DAILY AS NEEDED FOR DIZZINESS, Disp: 90 tablet, Rfl: 6   Multiple Vitamin  (MULITIVITAMIN WITH MINERALS) TABS, Take 1 tablet by mouth daily., Disp: , Rfl:    omeprazole (PRILOSEC) 40 MG capsule, Take 1 capsule (40 mg total) by mouth daily., Disp: 90 capsule, Rfl: 1   predniSONE (DELTASONE) 50 MG tablet, Take 1 tablet (50 mg total) by mouth daily with breakfast., Disp: 5 tablet, Rfl: 0   simvastatin (ZOCOR) 40 MG tablet, Take 1 tablet (40 mg total) by mouth daily., Disp: 90 tablet, Rfl: 1   tadalafil (CIALIS) 5 MG tablet, Take 1 tablet (5 mg total) by mouth daily., Disp: 90 tablet, Rfl: 3   tamsulosin (FLOMAX) 0.4 MG CAPS capsule, Take 2 capsules (0.8 mg total) by mouth daily., Disp: 180 capsule, Rfl: 1   zinc gluconate 50 MG tablet, Take by mouth., Disp: , Rfl:    albuterol (VENTOLIN HFA) 108 (90 Base) MCG/ACT inhaler, Inhale 2 puffs into the lungs every 6 (six) hours as needed for wheezing or shortness of breath. (Patient not taking: Reported on 04/27/2022), Disp: 8 g, Rfl: 0   calcium carbonate (OSCAL) 1500 (600 Ca) MG TABS tablet, Take by mouth., Disp: , Rfl:    chlorpheniramine-HYDROcodone (TUSSIONEX) 10-8 MG/5ML, Take 5 mLs by mouth at bedtime as needed for cough. (Patient not taking: Reported on 04/27/2022), Disp: 25 mL, Rfl: 0   Naproxen Sodium 220 MG CAPS, Take by mouth as needed., Disp: , Rfl:  No current facility-administered medications for this visit.  Facility-Administered Medications Ordered in Other Visits:    epoetin alfa-epbx (RETACRIT) injection 10,000 Units, 10,000 Units, Subcutaneous, Once, Sindy Guadel[place], MD   epoetin alfa-epbx (RETACRIT) injection 40,000 Units, 40,000 Units, Subcutaneous, Once, Sindy Guadel[place], MD  Physical exam:  Vitals:   04/27/22 0917  BP: 125/69  Pulse: 64  Resp: 18  Temp: 98 F (36.7 C)  SpO2: 98%  Weight: 182 lb 8 oz (82.8 kg)   Physical Exam Constitutional:      General: He is not in acute distress. Cardiovascular:     Rate and Rhythm: Normal rate and regular rhythm.     Heart sounds: Normal heart sounds.   Pulmonary:     Effort: Pulmonary effort is normal.     Breath sounds: Normal breath sounds.  Skin:  General: Skin is warm and dry.  Neurological:     Mental Status: He is alert and oriented to person, place, and time.         Latest Ref Rng & Units 04/27/2022    8:54 AM  CMP  Glucose 70 - 99 mg/dL 118   BUN 8 - 23 mg/dL 48   Creatinine 0.61 - 1.24 mg/dL 1.93   Sodium 135 - 145 mmol/L 138   Potassium 3.5 - 5.1 mmol/L 6.1   Chloride 98 - 111 mmol/L 113   CO2 22 - 32 mmol/L 21   Calcium 8.9 - 10.3 mg/dL 8.5   Total Protein 6.5 - 8.1 g/dL 6.0   Total Bilirubin 0.3 - 1.2 mg/dL 0.7   Alkaline Phos 38 - 126 U/L 71   AST 15 - 41 U/L 19   ALT 0 - 44 U/L 19       Latest Ref Rng & Units 04/27/2022    8:54 AM  CBC  WBC 4.0 - 10.5 K/uL 3.0   Hemoglobin 13.0 - 17.0 g/dL 9.7   Hematocrit 39.0 - 52.0 % 31.6   Platelets 150 - 400 K/uL 189     No images are attached to the encounter.  CT ANGIO HEAD NECK W WO CM  Result Date: 04/12/2022 CLINICAL DATA:  Stroke workup EXAM: CT ANGIOGRAPHY HEAD AND NECK TECHNIQUE: Multidetector CT imaging of the head and neck was performed using the standard protocol during bolus administration of intravenous contrast. Multiplanar CT image reconstructions and MIPs were obtained to evaluate the vascular anatomy. Carotid stenosis measurements (when applicable) are obtained utilizing NASCET criteria, using the distal internal carotid diameter as the denominator. RADIATION DOSE REDUCTION: This exam was performed according to the departmental dose-optimization program which includes automated exposure control, adjustment of the mA and/or kV according to patient size and/or use of iterative reconstruction technique. CONTRAST:  65mL OMNIPAQUE IOHEXOL 350 MG/ML SOLN COMPARISON:  CT head 10/12/2021, MRI head 04/12/2022, MRA head 11/20/2007, no prior CTA head or neck. FINDINGS: CT HEAD FINDINGS Brain: No evidence of acute infarct, hemorrhage, mass, mass effect, or  midline shift. No hydrocephalus or extra-axial fluid collection. Vascular: No hyperdense vessel. Skull: Negative for fracture or focal lesion. Sinuses/Orbits: Mucous retention cysts in the right maxillary sinus. Mild mucosal thickening in the left maxillary sinus and ethmoid air cells. Status post bilateral lens replacements. Other: The mastoid air cells are well aerated. CTA NECK FINDINGS Aortic arch: 4 vessel arch, with the origin of the left vertebral artery coming from the aorta. Mild aortic atherosclerosis. Imaged portion shows no evidence of aneurysm or dissection. No significant stenosis of the major arch vessel origins. Right carotid system: No evidence of dissection, occlusion, or hemodynamically significant stenosis (greater than 50%). Left carotid system: No evidence of dissection, occlusion, or hemodynamically significant stenosis (greater than 50%). Vertebral arteries: No evidence of dissection, occlusion, or hemodynamically significant stenosis (greater than 50%). Skeleton: Increased osseous density, favored to be metabolic. No acute fracture or suspicious lesion. Degenerative changes in the cervical spine. Poor dentition. Other neck: Peripherally calcified 1.2 cm lesion in the right thyroid lobe, for which no follow-up is currently indicated. (Reference: J Am Coll Radiol. 2015 Feb;12(2): 143-50) Upper chest: No focal pulmonary opacity or pleural effusion. Review of the MIP images confirms the above findings CTA HEAD FINDINGS Anterior circulation: Both internal carotid arteries are patent to the termini, without significant stenosis. A1 segments patent. Normal anterior communicating artery. Anterior cerebral arteries are patent to their distal   aspects. No M1 stenosis or occlusion. MCA branches perfused and symmetric. Posterior circulation: Vertebral arteries patent to the vertebrobasilar junction without stenosis. Posterior inferior cerebellar arteries patent proximally. Basilar patent to its distal  aspect. Superior cerebellar arteries patent proximally. Patent P1 segments. PCAs perfused to their distal aspects without stenosis. The left posterior communicating artery is not visualized. The right posterior communicating artery is diminutive but patent. Venous sinuses: Patent. Anatomic variants: None significant. Review of the MIP images confirms the above findings IMPRESSION: 1. No acute intracranial process. 2. No intracranial large vessel occlusion or significant stenosis. 3. No hemodynamically significant stenosis in the neck. 4. Aortic atherosclerosis. Aortic Atherosclerosis (ICD10-I70.0). Electronically Signed   By: Merilyn Baba M.D.   On: 04/12/2022 20:47   MR BRAIN WO CONTRAST  Result Date: 04/12/2022 CLINICAL DATA:  Stroke follow-up EXAM: MRI HEAD WITHOUT CONTRAST TECHNIQUE: Multiplanar, multiecho pulse sequences of the brain and surrounding structures were obtained without intravenous contrast. COMPARISON:  MRI head 04/29/2019; correlation is also made with 10/12/2021 CT head FINDINGS: Brain: No restricted diffusion to suggest acute or subacute infarct. No acute hemorrhage, mass, mass effect, or midline shift. No hemosiderin deposition to suggest remote hemorrhage. No hydrocephalus or extra-axial collection. Scattered T2 hyperintense signal in the periventricular white matter, likely the sequela of mild chronic small vessel ischemic disease. Cerebral volume is within normal limits for age. Vascular: Normal arterial flow voids. Skull and upper cervical spine: Normal marrow signal. Sinuses/Orbits: Mucous retention cysts in the right maxillary sinus. Status post bilateral lens replacements. Additional postsurgical changes in the right globe. Other: The mastoids are well aerated. IMPRESSION: No acute intracranial process. No evidence of acute or subacute infarct. Electronically Signed   By: Merilyn Baba M.D.   On: 04/12/2022 19:36     Assessment and plan- Patient is a 81 y.o. male with history of  primary myelofibrosis predominantly anemia.  He is currently on Retacrit and this is a routine follow-up visit  Patient's hemoglobin has remained stable between 8.5-10 over the last 1-1/2-year.  Hemoglobin is presently 9.7 today.  He is on Retacrit 50,000 units weekly which she will continue.  As long as he is responding to Retacrit we do not have to switch him to alternative treatments at this time.  H&H and Retacrit every week and I will see him back in 3 months with CBC ferritin and iron studies   Visit Diagnosis 1. Myelofibrosis ([place])   2. Anemia of chronic kidney failure, stage 3 (moderate) ([place])   3. Erythropoietin (EPO) stimulating agent anemia management patient      Dr. Randa Evens, MD, MPH [facility] at [facility] [phone] 04/27/2022 3:29 PM

## 2022-05-03 ENCOUNTER — Ambulatory Visit: Payer: Medicare Other | Admitting: Oncology

## 2022-05-03 ENCOUNTER — Other Ambulatory Visit: Payer: Medicare Other

## 2022-05-03 ENCOUNTER — Ambulatory Visit: Payer: Medicare Other

## 2022-05-04 ENCOUNTER — Inpatient Hospital Stay: Payer: Medicare Other

## 2022-05-04 ENCOUNTER — Other Ambulatory Visit: Payer: Self-pay | Admitting: Family Medicine

## 2022-05-04 VITALS — BP 130/83 | HR 66

## 2022-05-04 DIAGNOSIS — D7581 Myelofibrosis: Secondary | ICD-10-CM | POA: Diagnosis not present

## 2022-05-04 DIAGNOSIS — D631 Anemia in chronic kidney disease: Secondary | ICD-10-CM

## 2022-05-04 DIAGNOSIS — D473 Essential (hemorrhagic) thrombocythemia: Secondary | ICD-10-CM | POA: Diagnosis not present

## 2022-05-04 DIAGNOSIS — N183 Chronic kidney disease, stage 3 unspecified: Secondary | ICD-10-CM | POA: Diagnosis not present

## 2022-05-04 LAB — HEMOGLOBIN AND HEMATOCRIT, BLOOD
HCT: 29.4 % — ABNORMAL LOW (ref 39.0–52.0)
Hemoglobin: 9.1 g/dL — ABNORMAL LOW (ref 13.0–17.0)

## 2022-05-04 MED ORDER — EPOETIN ALFA-EPBX 40000 UNIT/ML IJ SOLN
40000.0000 [IU] | Freq: Once | INTRAMUSCULAR | Status: AC
  Administered 2022-05-04: 40000 [IU] via SUBCUTANEOUS
  Filled 2022-05-04: qty 1

## 2022-05-04 MED ORDER — EPOETIN ALFA-EPBX 20000 UNIT/ML IJ SOLN: 50000.0000 [IU] | Freq: Once | INTRAMUSCULAR | Status: DC

## 2022-05-04 MED ORDER — EPOETIN ALFA-EPBX 10000 UNIT/ML IJ SOLN
10000.0000 [IU] | Freq: Once | INTRAMUSCULAR | Status: AC
  Administered 2022-05-04: 10000 [IU] via SUBCUTANEOUS
  Filled 2022-05-04: qty 1

## 2022-05-05 NOTE — Telephone Encounter (Signed)
Refilled 01/20/2022 6 month supply. Requested Prescriptions  Pending Prescriptions Disp Refills   omeprazole (PRILOSEC) 40 MG capsule [Pharmacy Med Name: OMEPRAZOLE DR 40 MG CAP] 90 capsule 1    Sig: TAKE 1 CAPSULE (40 MG) BY MOUTH EVERY DAY     Gastroenterology: Proton Pump Inhibitors Passed - 05/04/2022  6:02 PM      Passed - Valid encounter within last 12 months    Recent Outpatient Visits           3 months ago Upper respiratory tract infection, unspecified type   [place], Megan P, DO   3 months ago Dizziness   [place], [place], DO   4 months ago Dizziness   [place], [place], DO   6 months ago Benign prostatic hyperplasia with nocturia   [place], [place], DO   6 months ago Dizziness   [facility] [name], DO       Future Appointments             In 6 months Ralene Bathe, MD [place]

## 2022-05-11 ENCOUNTER — Inpatient Hospital Stay: Payer: Medicare Other | Attending: Oncology

## 2022-05-11 ENCOUNTER — Inpatient Hospital Stay: Payer: Medicare Other

## 2022-05-11 DIAGNOSIS — D473 Essential (hemorrhagic) thrombocythemia: Secondary | ICD-10-CM | POA: Diagnosis not present

## 2022-05-11 DIAGNOSIS — N183 Chronic kidney disease, stage 3 unspecified: Secondary | ICD-10-CM

## 2022-05-11 DIAGNOSIS — D7581 Myelofibrosis: Secondary | ICD-10-CM | POA: Diagnosis not present

## 2022-05-11 LAB — HEMOGLOBIN AND HEMATOCRIT, BLOOD
HCT: 33.3 % — ABNORMAL LOW (ref 39.0–52.0)
Hemoglobin: 10.1 g/dL — ABNORMAL LOW (ref 13.0–17.0)

## 2022-05-11 NOTE — Progress Notes (Signed)
Patient hgb at 10.1 no injection today

## 2022-05-12 ENCOUNTER — Encounter (INDEPENDENT_AMBULATORY_CARE_PROVIDER_SITE_OTHER): Payer: Medicare Other | Admitting: Ophthalmology

## 2022-05-18 ENCOUNTER — Inpatient Hospital Stay: Payer: Medicare Other

## 2022-05-18 ENCOUNTER — Encounter (INDEPENDENT_AMBULATORY_CARE_PROVIDER_SITE_OTHER): Payer: Medicare Other | Admitting: Ophthalmology

## 2022-05-18 VITALS — BP 115/70

## 2022-05-18 DIAGNOSIS — D631 Anemia in chronic kidney disease: Secondary | ICD-10-CM

## 2022-05-18 DIAGNOSIS — M9903 Segmental and somatic dysfunction of lumbar region: Secondary | ICD-10-CM | POA: Diagnosis not present

## 2022-05-18 DIAGNOSIS — D473 Essential (hemorrhagic) thrombocythemia: Secondary | ICD-10-CM | POA: Diagnosis not present

## 2022-05-18 DIAGNOSIS — D7581 Myelofibrosis: Secondary | ICD-10-CM

## 2022-05-18 DIAGNOSIS — M5412 Radiculopathy, cervical region: Secondary | ICD-10-CM | POA: Diagnosis not present

## 2022-05-18 DIAGNOSIS — N183 Chronic kidney disease, stage 3 unspecified: Secondary | ICD-10-CM

## 2022-05-18 DIAGNOSIS — M9901 Segmental and somatic dysfunction of cervical region: Secondary | ICD-10-CM | POA: Diagnosis not present

## 2022-05-18 DIAGNOSIS — M5033 Other cervical disc degeneration, cervicothoracic region: Secondary | ICD-10-CM | POA: Diagnosis not present

## 2022-05-18 LAB — HEMOGLOBIN AND HEMATOCRIT, BLOOD
HCT: 31.1 % — ABNORMAL LOW (ref 39.0–52.0)
Hemoglobin: 9.7 g/dL — ABNORMAL LOW (ref 13.0–17.0)

## 2022-05-18 MED ORDER — EPOETIN ALFA-EPBX 40000 UNIT/ML IJ SOLN
40000.0000 [IU] | Freq: Once | INTRAMUSCULAR | Status: AC
  Administered 2022-05-18: 40000 [IU] via SUBCUTANEOUS
  Filled 2022-05-18: qty 1

## 2022-05-18 MED ORDER — EPOETIN ALFA-EPBX 20000 UNIT/ML IJ SOLN: 50000.0000 [IU] | Freq: Once | INTRAMUSCULAR | Status: DC

## 2022-05-18 MED ORDER — EPOETIN ALFA-EPBX 10000 UNIT/ML IJ SOLN
10000.0000 [IU] | Freq: Once | INTRAMUSCULAR | Status: AC
  Administered 2022-05-18: 10000 [IU] via SUBCUTANEOUS
  Filled 2022-05-18: qty 1

## 2022-05-21 ENCOUNTER — Other Ambulatory Visit: Payer: Self-pay | Admitting: Cardiovascular Disease

## 2022-05-25 ENCOUNTER — Inpatient Hospital Stay: Payer: Medicare Other

## 2022-05-25 VITALS — BP 108/78

## 2022-05-25 DIAGNOSIS — D7581 Myelofibrosis: Secondary | ICD-10-CM | POA: Diagnosis not present

## 2022-05-25 DIAGNOSIS — D473 Essential (hemorrhagic) thrombocythemia: Secondary | ICD-10-CM | POA: Diagnosis not present

## 2022-05-25 DIAGNOSIS — N183 Chronic kidney disease, stage 3 unspecified: Secondary | ICD-10-CM

## 2022-05-25 LAB — HEMOGLOBIN AND HEMATOCRIT, BLOOD
HCT: 28.4 % — ABNORMAL LOW (ref 39.0–52.0)
Hemoglobin: 8.9 g/dL — ABNORMAL LOW (ref 13.0–17.0)

## 2022-05-25 MED ORDER — EPOETIN ALFA-EPBX 10000 UNIT/ML IJ SOLN
10000.0000 [IU] | Freq: Once | INTRAMUSCULAR | Status: AC
  Administered 2022-05-25: 10000 [IU] via SUBCUTANEOUS
  Filled 2022-05-25: qty 1

## 2022-05-25 MED ORDER — EPOETIN ALFA-EPBX 20000 UNIT/ML IJ SOLN: 50000.0000 [IU] | Freq: Once | INTRAMUSCULAR | Status: DC

## 2022-05-25 MED ORDER — EPOETIN ALFA-EPBX 40000 UNIT/ML IJ SOLN
40000.0000 [IU] | Freq: Once | INTRAMUSCULAR | Status: AC
  Administered 2022-05-25: 40000 [IU] via SUBCUTANEOUS
  Filled 2022-05-25: qty 1

## 2022-06-01 ENCOUNTER — Inpatient Hospital Stay: Payer: Medicare Other

## 2022-06-01 ENCOUNTER — Telehealth: Payer: Self-pay | Admitting: *Deleted

## 2022-06-01 NOTE — Telephone Encounter (Signed)
Patient showed up to the cancer center this morning with symptoms of chills, vomiting, insomnia and severe fatigue. Pts wife admitted last night to [facility] with pneumonia. Pt states that he has similar symptoms. He was advised to go to the ER, acute care or pcp. We can resume his retacrit schedule once he is feeling better and URI symptoms are under control. I also discussed hospital visitation guidelines on acute sickness. Unfortunately, given his present symptoms, it is not advisable for him to be visiting his wife with acute URI symptoms. I also asked pt to get covid testing, rsv and flu testing. He gave verbal understanding of the plan. Discussed plan with Judson Roch, PA.

## 2022-06-03 ENCOUNTER — Encounter: Payer: Self-pay | Admitting: Physician Assistant

## 2022-06-03 ENCOUNTER — Ambulatory Visit (INDEPENDENT_AMBULATORY_CARE_PROVIDER_SITE_OTHER): Payer: Medicare Other | Admitting: Physician Assistant

## 2022-06-03 VITALS — BP 128/71 | HR 65 | Temp 98.8°F | Wt 182.6 lb

## 2022-06-03 DIAGNOSIS — J069 Acute upper respiratory infection, unspecified: Secondary | ICD-10-CM | POA: Diagnosis not present

## 2022-06-03 MED ORDER — BENZONATATE 200 MG PO CAPS: 200.0000 mg | ORAL_CAPSULE | Freq: Two times a day (BID) | ORAL | 0 refills | Status: DC | PRN

## 2022-06-03 NOTE — Progress Notes (Unsigned)
Acute Office Visit   Patient: [name]   DOB: [birth year]   81 y.o. Male  MRN: [id number] Visit Date: 06/03/2022  Today's healthcare provider: Dani Gobble Cully Luckow, PA-C  Introduced myself to the patient as a Journalist, newspaper and provided education on APPs in clinical practice.    Chief Complaint  Patient presents with   Congestion    Patient says he still has lingering chest congestion and runny nose. Patient says he has been sick for about a week and a half. Patient says he is getting better, but wanted to discuss the lingering cough and chest congestion. Patient has tried Coricidin pills and cough syrup.    Subjective    HPI HPI     Congestion    Additional comments: Patient says he still has lingering chest congestion and runny nose. Patient says he has been sick for about a week and a half. Patient says he is getting better, but wanted to discuss the lingering cough and chest congestion. Patient has tried Coricidin pills and cough syrup.       Last edited by Irena Reichmann, [place] on 06/03/2022  9:10 AM.        Reports he has had persistent cough, congestion Onset:sudden with nausea, vomiting and diarrhea Duration: about a week and a half Associated symptoms: At this time he reports cough, dizziness and congestion Thinks it is getting better since his symptoms are improving Interventions: Coricidin and cough syrup  Sick contacts: his wife is sick as well - in the hospital     Medications: Outpatient Medications Prior to Visit  Medication Sig   amLODipine (NORVASC) 10 MG tablet TAKE 1 TABLET BY MOUTH DAILY IN THE EVENING.   azelastine (ASTELIN) 0.1 % nasal spray Place 1 spray into both nostrils 2 (two) times daily. Use in each nostril as directed   calcium carbonate (OSCAL) 1500 (600 Ca) MG TABS tablet Take by mouth.   Cholecalciferol (D3-1000 PO) Take by mouth daily.   clopidogrel (PLAVIX) 75 MG tablet Take 1 tablet (75 mg total) by mouth daily.   DULoxetine (CYMBALTA)  20 MG capsule TAKE 1 CAPSULE BY MOUTH DAILY.   gabapentin (NEURONTIN) 300 MG capsule Take 1 tab in the AM and 2 tabs at bedtime.   Ginsengs-Saw Palmetto (MULTI GINSENG & SAW PALMETTO) 500 MG CAPS Take 500 mg by mouth daily.   losartan (COZAAR) 100 MG tablet TAKE 1 TABLET BY MOUTH IN THE MORNING.   meclizine (ANTIVERT) 25 MG tablet TAKE 1 TABLET BY MOUTH 3 TIMES DAILY AS NEEDED FOR DIZZINESS   Multiple Vitamin (MULITIVITAMIN WITH MINERALS) TABS Take 1 tablet by mouth daily.   Naproxen Sodium 220 MG CAPS Take by mouth as needed.   omeprazole (PRILOSEC) 40 MG capsule Take 1 capsule (40 mg total) by mouth daily.   simvastatin (ZOCOR) 40 MG tablet Take 1 tablet (40 mg total) by mouth daily.   tadalafil (CIALIS) 5 MG tablet Take 1 tablet (5 mg total) by mouth daily.   tamsulosin (FLOMAX) 0.4 MG CAPS capsule Take 2 capsules (0.8 mg total) by mouth daily.   zinc gluconate 50 MG tablet Take by mouth.   albuterol (VENTOLIN HFA) 108 (90 Base) MCG/ACT inhaler Inhale 2 puffs into the lungs every 6 (six) hours as needed for wheezing or shortness of breath. (Patient not taking: Reported on 04/27/2022)   chlorpheniramine-HYDROcodone (TUSSIONEX) 10-8 MG/5ML Take 5 mLs by mouth at bedtime as needed for cough. (Patient not  taking: Reported on 04/27/2022)   predniSONE (DELTASONE) 50 MG tablet Take 1 tablet (50 mg total) by mouth daily with breakfast. (Patient not taking: Reported on 06/03/2022)   Facility-Administered Medications Prior to Visit  Medication Dose Route Frequency Provider   epoetin alfa-epbx (RETACRIT) injection 10,000 Units  10,000 Units Subcutaneous Once Sindy Guadeloupe, MD   epoetin alfa-epbx (RETACRIT) injection 40,000 Units  40,000 Units Subcutaneous Once Sindy Guadeloupe, MD    Review of Systems  Constitutional:  Negative for chills, fatigue and fever.  HENT:  Positive for congestion and postnasal drip. Negative for sinus pressure and sinus pain.   Respiratory:  Positive for cough and shortness  of breath. Negative for wheezing.   Gastrointestinal:  Negative for diarrhea, nausea and vomiting.  Neurological:  Positive for dizziness. Negative for light-headedness and headaches.    {Labs  Heme  Chem  Endocrine  Serology  Results Review (optional):23779}   Objective    BP 128/71 (BP Location: Left Arm, Cuff Size: Normal)   Pulse 65   Temp 98.8 F (37.1 C)   Wt 182 lb 9.6 oz (82.8 kg)   SpO2 97%   BMI 26.20 kg/m  {Show previous vital signs (optional):23777}  Physical Exam Vitals reviewed.  Constitutional:      General: He is awake.     Appearance: Normal appearance. He is well-developed and well-groomed.  HENT:     Head: Normocephalic and atraumatic.     Right Ear: Ear canal and external ear normal. A middle ear effusion is present.     Left Ear: Ear canal and external ear normal. A middle ear effusion is present.     Mouth/Throat:     Lips: Pink.     Pharynx: Oropharynx is clear. Uvula midline. No oropharyngeal exudate, posterior oropharyngeal erythema or uvula swelling.  Eyes:     Extraocular Movements: Extraocular movements intact.     Conjunctiva/sclera: Conjunctivae normal.  Cardiovascular:     Rate and Rhythm: Normal rate and regular rhythm.     Heart sounds: Normal heart sounds. No murmur heard.    No friction rub. No gallop.  Pulmonary:     Effort: Pulmonary effort is normal.     Breath sounds: Normal breath sounds. No decreased air movement. No decreased breath sounds, wheezing, rhonchi or rales.  Musculoskeletal:     Cervical back: Normal range of motion and neck supple.  Lymphadenopathy:     Head:     Right side of head: No submental, submandibular or preauricular adenopathy.     Left side of head: No submental, submandibular or preauricular adenopathy.     Cervical:     Right cervical: No superficial or posterior cervical adenopathy.    Left cervical: No superficial or posterior cervical adenopathy.  Skin:    General: Skin is warm.   Neurological:     General: No focal deficit present.     Mental Status: He is alert and oriented to person, place, and time.  Psychiatric:        Mood and Affect: Mood normal.        Behavior: Behavior normal. Behavior is cooperative.        Thought Content: Thought content normal.        Judgment: Judgment normal.       No results found for any visits on 06/03/22.  Assessment & Plan      No follow-ups on file.      Problem List Items Addressed This Visit  None Visit Diagnoses     Viral upper respiratory tract infection    -  Primary   Relevant Medications   benzonatate (TESSALON) 200 MG capsule        No follow-ups on file.   I, Meili Kleckley E Cleota Pellerito, PA-C, have reviewed all documentation for this visit. The documentation on 06/03/22 for the exam, diagnosis, procedures, and orders are all accurate and complete.   Talitha Givens, MHS, PA-C [facility]

## 2022-06-03 NOTE — Patient Instructions (Addendum)
   Based on your described symptoms and the duration of symptoms it is likely that you have a viral upper respiratory infection (often called a "cold")  Symptoms can last for 3-10 days with lingering cough and intermittent symptoms lasting weeks after that.  The goal of treatment at this time is to reduce your symptoms and discomfort   I have sent in Tessalon pearls for you to take twice per day to help with your cough  You can take Mucinex to help with the mucus and nasal congestion- please take this with plenty of water to help thin out the mucus You can take Robitussin to help with the coughing  You can take Tylenol to help with body aches  I would like you to take Flonase to help with the congestion and dizziness- this will help to open up your sinuses  - make sure to take the Flonase for several weeks to make sure your sinuses open up fully  If your symptoms do not improve or become worse in the next 5-7 days please make an apt at the office so we can see you  Go to the ER if you begin to have more serious symptoms such as shortness of breath, trouble breathing, loss of consciousness, swelling around the eyes, high fever, severe lasting headaches, vision changes or neck pain/stiffness.

## 2022-06-08 ENCOUNTER — Inpatient Hospital Stay: Payer: Medicare Other

## 2022-06-08 ENCOUNTER — Inpatient Hospital Stay: Payer: Medicare Other | Attending: Oncology

## 2022-06-08 VITALS — BP 143/69

## 2022-06-08 DIAGNOSIS — D7581 Myelofibrosis: Secondary | ICD-10-CM

## 2022-06-08 DIAGNOSIS — D473 Essential (hemorrhagic) thrombocythemia: Secondary | ICD-10-CM | POA: Insufficient documentation

## 2022-06-08 DIAGNOSIS — M5033 Other cervical disc degeneration, cervicothoracic region: Secondary | ICD-10-CM | POA: Diagnosis not present

## 2022-06-08 DIAGNOSIS — M9901 Segmental and somatic dysfunction of cervical region: Secondary | ICD-10-CM | POA: Diagnosis not present

## 2022-06-08 DIAGNOSIS — N183 Anemia in chronic kidney disease: Secondary | ICD-10-CM

## 2022-06-08 DIAGNOSIS — M5412 Radiculopathy, cervical region: Secondary | ICD-10-CM | POA: Diagnosis not present

## 2022-06-08 DIAGNOSIS — M9903 Segmental and somatic dysfunction of lumbar region: Secondary | ICD-10-CM | POA: Diagnosis not present

## 2022-06-08 LAB — HEMOGLOBIN AND HEMATOCRIT, BLOOD
HCT: 28.4 % — ABNORMAL LOW (ref 39.0–52.0)
Hemoglobin: 8.9 g/dL — ABNORMAL LOW (ref 13.0–17.0)

## 2022-06-08 MED ORDER — EPOETIN ALFA-EPBX 10000 UNIT/ML IJ SOLN
10000.0000 [IU] | Freq: Once | INTRAMUSCULAR | Status: AC
  Administered 2022-06-08: 10000 [IU] via SUBCUTANEOUS
  Filled 2022-06-08: qty 1

## 2022-06-08 MED ORDER — EPOETIN ALFA-EPBX 40000 UNIT/ML IJ SOLN
40000.0000 [IU] | Freq: Once | INTRAMUSCULAR | Status: AC
  Administered 2022-06-08: 40000 [IU] via SUBCUTANEOUS
  Filled 2022-06-08: qty 1

## 2022-06-08 MED ORDER — EPOETIN ALFA-EPBX 20000 UNIT/ML IJ SOLN: 50000.0000 [IU] | Freq: Once | INTRAMUSCULAR | Status: DC

## 2022-06-15 ENCOUNTER — Inpatient Hospital Stay: Payer: Medicare Other

## 2022-06-15 VITALS — BP 152/77 | HR 79 | Temp 97.8°F

## 2022-06-15 DIAGNOSIS — D7581 Myelofibrosis: Secondary | ICD-10-CM

## 2022-06-15 DIAGNOSIS — D473 Essential (hemorrhagic) thrombocythemia: Secondary | ICD-10-CM | POA: Diagnosis not present

## 2022-06-15 DIAGNOSIS — N183 Anemia in chronic kidney disease: Secondary | ICD-10-CM

## 2022-06-15 DIAGNOSIS — D631 Anemia in chronic kidney disease: Secondary | ICD-10-CM

## 2022-06-15 LAB — HEMOGLOBIN AND HEMATOCRIT, BLOOD
HCT: 28.4 % — ABNORMAL LOW (ref 39.0–52.0)
Hemoglobin: 8.8 g/dL — ABNORMAL LOW (ref 13.0–17.0)

## 2022-06-15 MED ORDER — EPOETIN ALFA-EPBX 40000 UNIT/ML IJ SOLN
40000.0000 [IU] | Freq: Once | INTRAMUSCULAR | Status: AC
  Administered 2022-06-15: 40000 [IU] via SUBCUTANEOUS
  Filled 2022-06-15: qty 1

## 2022-06-15 MED ORDER — EPOETIN ALFA-EPBX 20000 UNIT/ML IJ SOLN: 50000.0000 [IU] | Freq: Once | INTRAMUSCULAR | Status: DC

## 2022-06-15 MED ORDER — EPOETIN ALFA-EPBX 10000 UNIT/ML IJ SOLN
10000.0000 [IU] | Freq: Once | INTRAMUSCULAR | Status: AC
  Administered 2022-06-15: 10000 [IU] via SUBCUTANEOUS
  Filled 2022-06-15: qty 1

## 2022-06-22 ENCOUNTER — Inpatient Hospital Stay: Payer: Medicare Other

## 2022-06-22 VITALS — BP 149/77

## 2022-06-22 DIAGNOSIS — D7581 Myelofibrosis: Secondary | ICD-10-CM

## 2022-06-22 DIAGNOSIS — N183 Chronic kidney disease, stage 3 unspecified: Secondary | ICD-10-CM

## 2022-06-22 DIAGNOSIS — D473 Essential (hemorrhagic) thrombocythemia: Secondary | ICD-10-CM | POA: Diagnosis not present

## 2022-06-22 LAB — HEMOGLOBIN AND HEMATOCRIT, BLOOD
HCT: 29.8 % — ABNORMAL LOW (ref 39.0–52.0)
Hemoglobin: 9.2 g/dL — ABNORMAL LOW (ref 13.0–17.0)

## 2022-06-22 MED ORDER — EPOETIN ALFA-EPBX 20000 UNIT/ML IJ SOLN: 50000.0000 [IU] | Freq: Once | INTRAMUSCULAR | Status: DC

## 2022-06-22 MED ORDER — EPOETIN ALFA-EPBX 40000 UNIT/ML IJ SOLN
40000.0000 [IU] | Freq: Once | INTRAMUSCULAR | Status: AC
  Administered 2022-06-22: 40000 [IU] via SUBCUTANEOUS
  Filled 2022-06-22: qty 1

## 2022-06-22 MED ORDER — EPOETIN ALFA-EPBX 10000 UNIT/ML IJ SOLN
10000.0000 [IU] | Freq: Once | INTRAMUSCULAR | Status: AC
  Administered 2022-06-22: 10000 [IU] via SUBCUTANEOUS
  Filled 2022-06-22: qty 1

## 2022-06-29 ENCOUNTER — Inpatient Hospital Stay: Payer: Medicare Other

## 2022-06-29 DIAGNOSIS — M9901 Segmental and somatic dysfunction of cervical region: Secondary | ICD-10-CM | POA: Diagnosis not present

## 2022-06-29 DIAGNOSIS — D631 Anemia in chronic kidney disease: Secondary | ICD-10-CM

## 2022-06-29 DIAGNOSIS — D7581 Myelofibrosis: Secondary | ICD-10-CM

## 2022-06-29 DIAGNOSIS — M9903 Segmental and somatic dysfunction of lumbar region: Secondary | ICD-10-CM | POA: Diagnosis not present

## 2022-06-29 DIAGNOSIS — M5033 Other cervical disc degeneration, cervicothoracic region: Secondary | ICD-10-CM | POA: Diagnosis not present

## 2022-06-29 DIAGNOSIS — D473 Essential (hemorrhagic) thrombocythemia: Secondary | ICD-10-CM | POA: Diagnosis not present

## 2022-06-29 DIAGNOSIS — M5412 Radiculopathy, cervical region: Secondary | ICD-10-CM | POA: Diagnosis not present

## 2022-06-29 LAB — HEMOGLOBIN AND HEMATOCRIT, BLOOD
HCT: 37.2 % — ABNORMAL LOW (ref 39.0–52.0)
Hemoglobin: 11.2 g/dL — ABNORMAL LOW (ref 13.0–17.0)

## 2022-07-06 ENCOUNTER — Encounter (INDEPENDENT_AMBULATORY_CARE_PROVIDER_SITE_OTHER): Payer: Medicare Other | Admitting: Ophthalmology

## 2022-07-06 ENCOUNTER — Inpatient Hospital Stay: Payer: Medicare Other

## 2022-07-06 DIAGNOSIS — D473 Essential (hemorrhagic) thrombocythemia: Secondary | ICD-10-CM | POA: Diagnosis not present

## 2022-07-06 DIAGNOSIS — D7581 Myelofibrosis: Secondary | ICD-10-CM

## 2022-07-06 DIAGNOSIS — D631 Anemia in chronic kidney disease: Secondary | ICD-10-CM

## 2022-07-06 DIAGNOSIS — N183 Chronic kidney disease, stage 3 unspecified: Secondary | ICD-10-CM

## 2022-07-06 LAB — HEMOGLOBIN AND HEMATOCRIT, BLOOD
HCT: 33.4 % — ABNORMAL LOW (ref 39.0–52.0)
Hemoglobin: 10.2 g/dL — ABNORMAL LOW (ref 13.0–17.0)

## 2022-07-06 NOTE — Progress Notes (Signed)
Hgb 10.2 today. Injection held

## 2022-07-07 ENCOUNTER — Encounter (INDEPENDENT_AMBULATORY_CARE_PROVIDER_SITE_OTHER): Payer: Medicare Other | Admitting: Ophthalmology

## 2022-07-07 DIAGNOSIS — H3412 Central retinal artery occlusion, left eye: Secondary | ICD-10-CM | POA: Diagnosis not present

## 2022-07-07 DIAGNOSIS — H43813 Vitreous degeneration, bilateral: Secondary | ICD-10-CM

## 2022-07-07 DIAGNOSIS — I10 Essential (primary) hypertension: Secondary | ICD-10-CM

## 2022-07-07 DIAGNOSIS — H33302 Unspecified retinal break, left eye: Secondary | ICD-10-CM

## 2022-07-07 DIAGNOSIS — H35033 Hypertensive retinopathy, bilateral: Secondary | ICD-10-CM

## 2022-07-07 DIAGNOSIS — H338 Other retinal detachments: Secondary | ICD-10-CM

## 2022-07-13 ENCOUNTER — Inpatient Hospital Stay: Payer: Medicare Other

## 2022-07-13 ENCOUNTER — Inpatient Hospital Stay: Payer: Medicare Other | Attending: Oncology

## 2022-07-13 VITALS — BP 145/76

## 2022-07-13 DIAGNOSIS — D7581 Myelofibrosis: Secondary | ICD-10-CM | POA: Diagnosis not present

## 2022-07-13 DIAGNOSIS — D473 Essential (hemorrhagic) thrombocythemia: Secondary | ICD-10-CM | POA: Insufficient documentation

## 2022-07-13 DIAGNOSIS — N183 Chronic kidney disease, stage 3 unspecified: Secondary | ICD-10-CM

## 2022-07-13 LAB — HEMOGLOBIN AND HEMATOCRIT, BLOOD
HCT: 31.6 % — ABNORMAL LOW (ref 39.0–52.0)
Hemoglobin: 9.7 g/dL — ABNORMAL LOW (ref 13.0–17.0)

## 2022-07-13 MED ORDER — EPOETIN ALFA-EPBX 40000 UNIT/ML IJ SOLN
40000.0000 [IU] | Freq: Once | INTRAMUSCULAR | Status: AC
  Administered 2022-07-13: 40000 [IU] via SUBCUTANEOUS
  Filled 2022-07-13: qty 1

## 2022-07-13 MED ORDER — EPOETIN ALFA-EPBX 20000 UNIT/ML IJ SOLN: 50000.0000 [IU] | Freq: Once | INTRAMUSCULAR | Status: DC

## 2022-07-13 MED ORDER — EPOETIN ALFA-EPBX 10000 UNIT/ML IJ SOLN
10000.0000 [IU] | Freq: Once | INTRAMUSCULAR | Status: AC
  Administered 2022-07-13: 50000 [IU] via SUBCUTANEOUS
  Filled 2022-07-13: qty 1

## 2022-07-20 ENCOUNTER — Inpatient Hospital Stay: Payer: Medicare Other

## 2022-07-20 VITALS — BP 119/69

## 2022-07-20 DIAGNOSIS — D7581 Myelofibrosis: Secondary | ICD-10-CM | POA: Diagnosis not present

## 2022-07-20 DIAGNOSIS — M5033 Other cervical disc degeneration, cervicothoracic region: Secondary | ICD-10-CM | POA: Diagnosis not present

## 2022-07-20 DIAGNOSIS — N183 Chronic kidney disease, stage 3 unspecified: Secondary | ICD-10-CM

## 2022-07-20 DIAGNOSIS — M9901 Segmental and somatic dysfunction of cervical region: Secondary | ICD-10-CM | POA: Diagnosis not present

## 2022-07-20 DIAGNOSIS — D473 Essential (hemorrhagic) thrombocythemia: Secondary | ICD-10-CM | POA: Diagnosis not present

## 2022-07-20 DIAGNOSIS — M5412 Radiculopathy, cervical region: Secondary | ICD-10-CM | POA: Diagnosis not present

## 2022-07-20 DIAGNOSIS — M9903 Segmental and somatic dysfunction of lumbar region: Secondary | ICD-10-CM | POA: Diagnosis not present

## 2022-07-20 LAB — HEMOGLOBIN AND HEMATOCRIT, BLOOD
HCT: 30.1 % — ABNORMAL LOW (ref 39.0–52.0)
Hemoglobin: 9.3 g/dL — ABNORMAL LOW (ref 13.0–17.0)

## 2022-07-20 MED ORDER — EPOETIN ALFA-EPBX 20000 UNIT/ML IJ SOLN: 50000.0000 [IU] | Freq: Once | INTRAMUSCULAR | Status: DC

## 2022-07-20 MED ORDER — EPOETIN ALFA-EPBX 10000 UNIT/ML IJ SOLN
10000.0000 [IU] | Freq: Once | INTRAMUSCULAR | Status: AC
  Administered 2022-07-20: 10000 [IU] via SUBCUTANEOUS
  Filled 2022-07-20: qty 1

## 2022-07-20 MED ORDER — EPOETIN ALFA-EPBX 40000 UNIT/ML IJ SOLN
40000.0000 [IU] | Freq: Once | INTRAMUSCULAR | Status: AC
  Administered 2022-07-20: 40000 [IU] via SUBCUTANEOUS
  Filled 2022-07-20: qty 1

## 2022-07-26 ENCOUNTER — Other Ambulatory Visit: Payer: Self-pay | Admitting: Family Medicine

## 2022-07-27 ENCOUNTER — Encounter: Payer: Self-pay | Admitting: Oncology

## 2022-07-27 ENCOUNTER — Inpatient Hospital Stay: Payer: Medicare Other

## 2022-07-27 ENCOUNTER — Inpatient Hospital Stay (HOSPITAL_BASED_OUTPATIENT_CLINIC_OR_DEPARTMENT_OTHER): Payer: Medicare Other | Admitting: Oncology

## 2022-07-27 ENCOUNTER — Other Ambulatory Visit: Payer: Self-pay

## 2022-07-27 VITALS — BP 128/67 | HR 65 | Temp 97.6°F | Resp 16 | Ht 68.0 in | Wt 180.6 lb

## 2022-07-27 DIAGNOSIS — D631 Anemia in chronic kidney disease: Secondary | ICD-10-CM | POA: Diagnosis not present

## 2022-07-27 DIAGNOSIS — D649 Anemia, unspecified: Secondary | ICD-10-CM | POA: Diagnosis not present

## 2022-07-27 DIAGNOSIS — N183 Chronic kidney disease, stage 3 unspecified: Secondary | ICD-10-CM

## 2022-07-27 DIAGNOSIS — D7581 Myelofibrosis: Secondary | ICD-10-CM

## 2022-07-27 DIAGNOSIS — Z79899 Other long term (current) drug therapy: Secondary | ICD-10-CM | POA: Diagnosis not present

## 2022-07-27 DIAGNOSIS — D473 Essential (hemorrhagic) thrombocythemia: Secondary | ICD-10-CM | POA: Diagnosis not present

## 2022-07-27 LAB — IRON AND TIBC
Iron: 62 ug/dL (ref 45–182)
Saturation Ratios: 24 % (ref 17.9–39.5)
TIBC: 256 ug/dL (ref 250–450)
UIBC: 194 ug/dL

## 2022-07-27 LAB — CBC WITH DIFFERENTIAL/PLATELET
Abs Immature Granulocytes: 0.3 10*3/uL — ABNORMAL HIGH (ref 0.00–0.07)
Basophils Absolute: 0 10*3/uL (ref 0.0–0.1)
Basophils Relative: 1 %
Eosinophils Absolute: 0 10*3/uL (ref 0.0–0.5)
Eosinophils Relative: 1 %
HCT: 30.7 % — ABNORMAL LOW (ref 39.0–52.0)
Hemoglobin: 9.5 g/dL — ABNORMAL LOW (ref 13.0–17.0)
Immature Granulocytes: 9 %
Lymphocytes Relative: 24 %
Lymphs Abs: 0.8 10*3/uL (ref 0.7–4.0)
MCH: 28 pg (ref 26.0–34.0)
MCHC: 30.9 g/dL (ref 30.0–36.0)
MCV: 90.6 fL (ref 80.0–100.0)
Monocytes Absolute: 0.5 10*3/uL (ref 0.1–1.0)
Monocytes Relative: 16 %
Neutro Abs: 1.6 10*3/uL — ABNORMAL LOW (ref 1.7–7.7)
Neutrophils Relative %: 49 %
Platelets: 174 10*3/uL (ref 150–400)
RBC: 3.39 MIL/uL — ABNORMAL LOW (ref 4.22–5.81)
RDW: 19.9 % — ABNORMAL HIGH (ref 11.5–15.5)
Smear Review: NORMAL
WBC: 3.2 10*3/uL — ABNORMAL LOW (ref 4.0–10.5)
nRBC: 3.1 % — ABNORMAL HIGH (ref 0.0–0.2)

## 2022-07-27 LAB — FERRITIN: Ferritin: 84 ng/mL (ref 24–336)

## 2022-07-27 MED ORDER — EPOETIN ALFA-EPBX 20000 UNIT/ML IJ SOLN: 50000.0000 [IU] | Freq: Once | INTRAMUSCULAR | Status: DC

## 2022-07-27 MED ORDER — EPOETIN ALFA-EPBX 40000 UNIT/ML IJ SOLN
40000.0000 [IU] | Freq: Once | INTRAMUSCULAR | Status: AC
  Administered 2022-07-27: 40000 [IU] via SUBCUTANEOUS
  Filled 2022-07-27: qty 1

## 2022-07-27 MED ORDER — EPOETIN ALFA-EPBX 10000 UNIT/ML IJ SOLN
10000.0000 [IU] | Freq: Once | INTRAMUSCULAR | Status: AC
  Administered 2022-07-27: 10000 [IU] via SUBCUTANEOUS
  Filled 2022-07-27: qty 1

## 2022-07-27 NOTE — Progress Notes (Signed)
No concerns for the provider today.

## 2022-07-27 NOTE — Progress Notes (Signed)
Hematology/Oncology Consult note [facility]  [phone] [phone]  Patient Care Team: [name], DO as PCP - General (Family Medicine) Pieter Partridge, MD as Referring Physician (Hematology) Hayden Pedro, MD as Consulting Physician (Ophthalmology) Garvin Fila, MD as Consulting Physician (Neurology) Festus Aloe, MD as Consulting Physician (Urology) Minna Merritts, MD as Consulting Physician (Cardiology) Abbie Sons, MD (Urology) Sindy Guadeloupe, MD as Consulting Physician (Hematology and Oncology)   Name of the patient: [name]  ZC:[id number]  Aug 11, 1940   Date of visit: 07/27/22  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  At that time he was found to have a mild splenomegaly of 15.8 cm.DIPPS score is 80 (age 61- 1, hemoglobin less than 10- 2) and score of 4 if 1% circulating blasts included from 07/17/2014.   Bone marrow on 06/11/2014 was most consistent with primary myelofibrosis.  Bone marrow biopsy showed 1% abnormal cells: CD45+, CD5+, CD10, CD11c+/-, CD19+, CD2-+, (dim), CD22+ (dim, CD23+, CD38-/+, FMC7-, HLA-DR+, sig lambda+(dim).  Blasts were not increased 1.2%; hypercellular for age: 42%; JAK2 V617F mutation was negative.  CALR mutation positive.  Flow cytometry included about 1% CLL/SLL phenotype cells (99991111) of uncertain significance and some infiltrate into the marrow with increased atypical megakaryocytes.  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  MDS FISH panel was negative.   Patient also follows up with [facility] benign hematology Dr. Sabino Dick for his anemia.  Patient's hemoglobin was drifting down to the eights and was started on EPO in October 2022.  Interval  history-tolerating weekly Retacrit well.  Denies any specific complaints at this time.  No recent hospitalizations  ECOG PS- 1 Pain scale- 0   Review of systems- Review of Systems  Constitutional:  Negative for chills, fever, malaise/fatigue and weight loss.  HENT:  Negative for congestion, ear discharge and nosebleeds.   Eyes:  Negative for blurred vision.  Respiratory:  Negative for cough, hemoptysis, sputum production, shortness of breath and wheezing.   Cardiovascular:  Negative for chest pain, palpitations, orthopnea and claudication.  Gastrointestinal:  Negative for abdominal pain, blood in stool, constipation, diarrhea, heartburn, melena, nausea and vomiting.  Genitourinary:  Negative for dysuria, flank pain, frequency, hematuria and urgency.  Musculoskeletal:  Negative for back pain, joint pain and myalgias.  Skin:  Negative for rash.  Neurological:  Negative for dizziness, tingling, focal weakness, seizures, weakness and headaches.  Endo/Heme/Allergies:  Does not bruise/bleed easily.  Psychiatric/Behavioral:  Negative for depression and suicidal ideas. The patient does not have insomnia.       Allergies  Allergen Reactions   Meloxicam Nausea And Vomiting     Past Medical History:  Diagnosis Date   Arthritis    Benign hypertensive renal disease    GERD (gastroesophageal reflux disease)    Heartburn    History of retinal detachment    Hyperlipidemia    Hypertension    Melanoma ([place])    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 approximately 10-15 years ago   Myelofibrosis [facility])    Prostate hypertrophy    Stroke [facility]) June 2009   R brain subcortical infarct     Past Surgical History:  Procedure Laterality Date   ASPIRATION / INJECTION RENAL CYST  Feb 2017   BACK SURGERY  approx 20- 25 years ago   GAS INSERTION  08/11/2011   Procedure: INSERTION OF GAS;  Surgeon: Hayden Pedro, MD;  Location: [place];  Service: Ophthalmology;  Laterality: Right;  C3F8   SCLERAL  BUCKLE  08/11/2011   Procedure: SCLERAL BUCKLE;  Surgeon: Hayden Pedro, MD;  Location: [place];  Service: Ophthalmology;  Laterality: Right;   VARICOSE VEIN SURGERY      Social History   Socioeconomic History   Marital status: Married    Spouse name: Parke Simmers    Number of children: 2   Years of education: 12+   Highest education level: Some college, no degree  Occupational History   Occupation: Education officer, community: OTHER    Comment: community   Occupation: SELF EMPLOYED    Employer: SELF EMPLOYED  Tobacco Use   Smoking status: Former    Packs/day: 0.25    Types: Cigarettes    Quit date: 06/06/2010    Years since quitting: 12.1   Smokeless tobacco: Never  Vaping Use   Vaping Use: Never used  Substance and Sexual Activity   Alcohol use: No    Alcohol/week: 0.0 standard drinks of alcohol   Drug use: No   Sexual activity: Not Currently  Other Topics Concern   Not on file  Social History Narrative   Pt lives at home with his family.   Caffeine Use- 2 cups daily   Patient has 2 children.    Patient has some college.    Patient is right handed.          Works full time   Scientist, physiological Strain: Low Risk  (07/12/2021)   Overall Financial Resource Strain (CARDIA)    Difficulty of Paying Living Expenses: Not hard at all  Food Insecurity: No Food Insecurity (07/12/2021)   Hunger Vital Sign    Worried About Running Out of Food in the Last Year: Never true    [place] in the Last Year: Never true  Transportation Needs: No Transportation Needs (07/12/2021)   PRAPARE - Hydrologist (Medical): No    Lack of Transportation (Non-Medical): No  Physical Activity: Inactive (07/12/2021)   Exercise Vital Sign    Days of Exercise per Week: 0 days    Minutes of Exercise per Session: 0 min  Stress: No Stress Concern Present (07/12/2021)   [place]    Feeling  of Stress : Not at all  Social Connections: [place] (07/12/2021)   Social Connection and Isolation Panel [NHANES]    Frequency of Communication with Friends and Family: Three times a week    Frequency of Social Gatherings with Friends and Family: More than three times a week    Attends Religious Services: More than 4 times per year    Active Member of [employer] or Organizations: Yes    Attends Music therapist: More than 4 times per year    Marital Status: Married  Human resources officer Violence: Not At Risk (07/12/2021)   Humiliation, Afraid, Rape, and Kick questionnaire    Fear of Current or Ex-Partner: No    Emotionally Abused: No    Physically Abused: No    Sexually Abused: No    Family History  Problem Relation Age of Onset   Heart disease Father    Diabetes Son    Kidney disease Neg Hx    Prostate cancer  Neg Hx      Current Outpatient Medications:    albuterol (VENTOLIN HFA) 108 (90 Base) MCG/ACT inhaler, Inhale 2 puffs into the lungs every 6 (six) hours as needed for wheezing or shortness of breath., Disp: 8 g, Rfl: 0   amLODipine (NORVASC) 10 MG tablet, TAKE 1 TABLET BY MOUTH DAILY IN THE EVENING., Disp: 90 tablet, Rfl: 2   azelastine (ASTELIN) 0.1 % nasal spray, Place 1 spray into both nostrils 2 (two) times daily. Use in each nostril as directed, Disp: 30 mL, Rfl: 0   benzonatate (TESSALON) 200 MG capsule, Take 1 capsule (200 mg total) by mouth 2 (two) times daily as needed for cough., Disp: 20 capsule, Rfl: 0   calcium carbonate (OSCAL) 1500 (600 Ca) MG TABS tablet, Take by mouth., Disp: , Rfl:    chlorpheniramine-HYDROcodone (TUSSIONEX) 10-8 MG/5ML, Take 5 mLs by mouth at bedtime as needed for cough., Disp: 25 mL, Rfl: 0   Cholecalciferol (D3-1000 PO), Take by mouth daily., Disp: , Rfl:    clopidogrel (PLAVIX) 75 MG tablet, Take 1 tablet (75 mg total) by mouth daily., Disp: 90 tablet, Rfl: 0   DULoxetine (CYMBALTA) 20 MG capsule, TAKE 1 CAPSULE BY MOUTH  DAILY., Disp: 90 capsule, Rfl: 1   gabapentin (NEURONTIN) 300 MG capsule, Take 1 tab in the AM and 2 tabs at bedtime., Disp: 270 capsule, Rfl: 1   Ginsengs-Saw Palmetto (MULTI GINSENG & SAW PALMETTO) 500 MG CAPS, Take 500 mg by mouth daily., Disp: , Rfl:    losartan (COZAAR) 100 MG tablet, TAKE 1 TABLET BY MOUTH IN THE MORNING., Disp: 90 tablet, Rfl: 1   meclizine (ANTIVERT) 25 MG tablet, TAKE 1 TABLET BY MOUTH 3 TIMES DAILY AS NEEDED FOR DIZZINESS, Disp: 90 tablet, Rfl: 6   Multiple Vitamin (MULITIVITAMIN WITH MINERALS) TABS, Take 1 tablet by mouth daily., Disp: , Rfl:    Naproxen Sodium 220 MG CAPS, Take by mouth as needed., Disp: , Rfl:    omeprazole (PRILOSEC) 40 MG capsule, Take 1 capsule (40 mg total) by mouth daily., Disp: 90 capsule, Rfl: 1   predniSONE (DELTASONE) 50 MG tablet, Take 1 tablet (50 mg total) by mouth daily with breakfast., Disp: 5 tablet, Rfl: 0   simvastatin (ZOCOR) 40 MG tablet, Take 1 tablet (40 mg total) by mouth daily., Disp: 90 tablet, Rfl: 1   tadalafil (CIALIS) 5 MG tablet, Take 1 tablet (5 mg total) by mouth daily., Disp: 90 tablet, Rfl: 3   tamsulosin (FLOMAX) 0.4 MG CAPS capsule, Take 2 capsules (0.8 mg total) by mouth daily., Disp: 180 capsule, Rfl: 1   zinc gluconate 50 MG tablet, Take by mouth., Disp: , Rfl:  No current facility-administered medications for this visit.  Facility-Administered Medications Ordered in Other Visits:    epoetin alfa-epbx (RETACRIT) injection 10,000 Units, 10,000 Units, Subcutaneous, Once, Sindy Guadeloupe, MD   epoetin alfa-epbx (RETACRIT) injection 40,000 Units, 40,000 Units, Subcutaneous, Once, Sindy Guadeloupe, MD  Physical exam:  Vitals:   07/27/22 0904  BP: 128/67  Pulse: 65  Resp: 16  Temp: 97.6 F (36.4 C)  TempSrc: Tympanic  SpO2: 98%  Weight: 180 lb 9.6 oz (81.9 kg)  Height: 5' 8"$  (1.727 m)   Physical Exam Cardiovascular:     Rate and Rhythm: Normal rate and regular rhythm.     Heart sounds: Normal heart sounds.   Pulmonary:     Effort: Pulmonary effort is normal.     Breath sounds: Normal breath sounds.  Abdominal:  General: Bowel sounds are normal.     Palpations: Abdomen is soft.  Skin:    General: Skin is warm and dry.  Neurological:     Mental Status: He is alert and oriented to person, place, and time.         Latest Ref Rng & Units 04/27/2022    8:54 AM  CMP  Glucose 70 - 99 mg/dL 118   BUN 8 - 23 mg/dL 48   Creatinine 0.61 - 1.24 mg/dL 1.93   Sodium 135 - 145 mmol/L 138   Potassium 3.5 - 5.1 mmol/L 6.1   Chloride 98 - 111 mmol/L 113   CO2 22 - 32 mmol/L 21   Calcium 8.9 - 10.3 mg/dL 8.5   Total Protein 6.5 - 8.1 g/dL 6.0   Total Bilirubin 0.3 - 1.2 mg/dL 0.7   Alkaline Phos 38 - 126 U/L 71   AST 15 - 41 U/L 19   ALT 0 - 44 U/L 19       Latest Ref Rng & Units 07/27/2022    8:51 AM  CBC  WBC 4.0 - 10.5 K/uL 3.2   Hemoglobin 13.0 - 17.0 g/dL 9.5   Hematocrit 39.0 - 52.0 % 30.7   Platelets 150 - 400 K/uL 174      Assessment and plan- Patient is a 82 y.o. male with history of primary myelofibrosis predominantly anemia.  He is currently on Retacrit.  This is a routine follow-up visit  Patient's hemoglobin has remained stable between 9-10 for the last year.  He is presently on 50,000 units of Retacrit weekly which is working and he will continue on the same regimen.  He has had mild leukopenia which has also remained stable. Neutrophil counts remain more than 1.  Platelet counts are normal.  I will see him back in 3 months with labs CBC with differential CMP ferritin and iron studies   Visit Diagnosis 1. Anemia of chronic kidney failure, stage 3 (moderate) ([place])   2. Myelofibrosis ([place])   3. Erythropoietin (EPO) stimulating agent anemia management patient      Dr. Randa Evens, MD, MPH [facility] [id number] 07/27/2022 11:44 AM

## 2022-07-27 NOTE — Telephone Encounter (Signed)
Requested medication (s) are due for refill today - yes  Requested medication (s) are on the active medication list -yes  Future visit scheduled -no  Last refill: 01/20/22 #90  Notes to clinic: Patient has abnormal labs- outside provider- request sent for review   Requested Prescriptions  Pending Prescriptions Disp Refills   clopidogrel (PLAVIX) 75 MG tablet [Pharmacy Med Name: CLOPIDOGREL BISULFATE 75 MG TAB] 90 tablet 0    Sig: TAKE ONE TABLET (75 MG) BY MOUTH EVERY DAY     Hematology: Antiplatelets - clopidogrel Failed - 07/26/2022 10:05 AM      Failed - HCT in normal range and within 180 days    HCT  Date Value Ref Range Status  07/27/2022 30.7 (L) 39.0 - 52.0 % Final   Hematocrit  Date Value Ref Range Status  01/20/2022 30.7 (L) 37.5 - 51.0 % Final         Failed - HGB in normal range and within 180 days    Hemoglobin  Date Value Ref Range Status  07/27/2022 9.5 (L) 13.0 - 17.0 g/dL Final  01/20/2022 9.8 (L) 13.0 - 17.7 g/dL Final         Failed - Cr in normal range and within 360 days    Creatinine  Date Value Ref Range Status  06/18/2014 1.09 0.60 - 1.30 mg/dL Final   Creatinine, Ser  Date Value Ref Range Status  04/27/2022 1.93 (H) 0.61 - 1.24 mg/dL Final         Passed - PLT in normal range and within 180 days    Platelets  Date Value Ref Range Status  07/27/2022 174 150 - 400 K/uL Final  01/20/2022 196 150 - 450 x10E3/uL Final         Passed - Valid encounter within last 6 months    Recent Outpatient Visits           1 month ago Viral upper respiratory tract infection   [place], Erin E, PA-C   5 months ago Upper respiratory tract infection, unspecified type   [place], [place], DO   6 months ago [place], [place], DO   6 months ago [place], [place], DO   8 months ago Benign  prostatic hyperplasia with nocturia   [place], [place], DO       Future Appointments             In 4 months Ralene Bathe, MD [place]               Requested Prescriptions  Pending Prescriptions Disp Refills   clopidogrel (PLAVIX) 75 MG tablet [Pharmacy Med Name: CLOPIDOGREL BISULFATE 75 MG TAB] 90 tablet 0    Sig: TAKE ONE TABLET (75 MG) BY [place] DAY     Hematology: Antiplatelets - clopidogrel Failed - 07/26/2022 10:05 AM      Failed - HCT in normal range and within 180 days    HCT  Date Value Ref Range Status  07/27/2022 30.7 (L) 39.0 - 52.0 % Final   Hematocrit  Date Value Ref Range Status  01/20/2022 30.7 (L) 37.5 - 51.0 % Final         Failed - HGB in normal range and within 180 days    Hemoglobin  Date Value Ref Range  Status  07/27/2022 9.5 (L) 13.0 - 17.0 g/dL Final  01/20/2022 9.8 (L) 13.0 - 17.7 g/dL Final         Failed - Cr in normal range and within 360 days    Creatinine  Date Value Ref Range Status  06/18/2014 1.09 0.60 - 1.30 mg/dL Final   Creatinine, Ser  Date Value Ref Range Status  04/27/2022 1.93 (H) 0.61 - 1.24 mg/dL Final         Passed - PLT in normal range and within 180 days    Platelets  Date Value Ref Range Status  07/27/2022 174 150 - 400 K/uL Final  01/20/2022 196 150 - 450 x10E3/uL Final         Passed - Valid encounter within last 6 months    Recent Outpatient Visits           1 month ago Viral upper respiratory tract infection   [place], Erin E, PA-C   5 months ago Upper respiratory tract infection, unspecified type   [place], [place], DO   6 months ago [place], [place], DO   6 months ago [place], Megan P, DO   8 months ago Benign prostatic hyperplasia with nocturia   [place], [place], DO       Future Appointments             In 4 months Ralene Bathe, MD [place]

## 2022-07-28 ENCOUNTER — Telehealth: Payer: Self-pay | Admitting: Family Medicine

## 2022-07-28 NOTE — Telephone Encounter (Signed)
Copied from [place] [id number]. Topic: Medicare AWV >> Jul 28, 2022  9:44 AM [name] wrote: Reason for CRM: Called patient to schedule Medicare Annual Wellness Visit (AWV). Left message for patient to call back and schedule Medicare Annual Wellness Visit (AWV).  Last date of AWV: 07/12/2021   Please schedule an appointment at any time with [name], [facility]    If any questions, please contact me.  Thank you ,  [name]; [name] Direct Dial: [phone]

## 2022-08-03 ENCOUNTER — Inpatient Hospital Stay: Payer: Medicare Other

## 2022-08-03 DIAGNOSIS — D473 Essential (hemorrhagic) thrombocythemia: Secondary | ICD-10-CM | POA: Diagnosis not present

## 2022-08-03 DIAGNOSIS — D631 Anemia in chronic kidney disease: Secondary | ICD-10-CM

## 2022-08-03 DIAGNOSIS — N183 Chronic kidney disease, stage 3 unspecified: Secondary | ICD-10-CM

## 2022-08-03 DIAGNOSIS — D7581 Myelofibrosis: Secondary | ICD-10-CM | POA: Diagnosis not present

## 2022-08-03 LAB — HEMOGLOBIN AND HEMATOCRIT, BLOOD
HCT: 33.6 % — ABNORMAL LOW (ref 39.0–52.0)
Hemoglobin: 10.1 g/dL — ABNORMAL LOW (ref 13.0–17.0)

## 2022-08-03 NOTE — Progress Notes (Signed)
Hgb 10.1 today, no retacrit

## 2022-08-10 ENCOUNTER — Inpatient Hospital Stay: Payer: Medicare Other

## 2022-08-10 ENCOUNTER — Inpatient Hospital Stay: Payer: Medicare Other | Attending: Oncology

## 2022-08-10 VITALS — BP 147/68 | HR 61

## 2022-08-10 DIAGNOSIS — M9903 Segmental and somatic dysfunction of lumbar region: Secondary | ICD-10-CM | POA: Diagnosis not present

## 2022-08-10 DIAGNOSIS — N183 Chronic kidney disease, stage 3 unspecified: Secondary | ICD-10-CM

## 2022-08-10 DIAGNOSIS — D473 Essential (hemorrhagic) thrombocythemia: Secondary | ICD-10-CM | POA: Insufficient documentation

## 2022-08-10 DIAGNOSIS — M5412 Radiculopathy, cervical region: Secondary | ICD-10-CM | POA: Diagnosis not present

## 2022-08-10 DIAGNOSIS — M9901 Segmental and somatic dysfunction of cervical region: Secondary | ICD-10-CM | POA: Diagnosis not present

## 2022-08-10 DIAGNOSIS — D7581 Myelofibrosis: Secondary | ICD-10-CM | POA: Diagnosis not present

## 2022-08-10 DIAGNOSIS — M5033 Other cervical disc degeneration, cervicothoracic region: Secondary | ICD-10-CM | POA: Diagnosis not present

## 2022-08-10 LAB — HEMOGLOBIN AND HEMATOCRIT, BLOOD
HCT: 32.2 % — ABNORMAL LOW (ref 39.0–52.0)
Hemoglobin: 9.9 g/dL — ABNORMAL LOW (ref 13.0–17.0)

## 2022-08-10 MED ORDER — EPOETIN ALFA-EPBX 10000 UNIT/ML IJ SOLN
10000.0000 [IU] | Freq: Once | INTRAMUSCULAR | Status: AC
  Administered 2022-08-10: 10000 [IU] via SUBCUTANEOUS
  Filled 2022-08-10: qty 1

## 2022-08-10 MED ORDER — EPOETIN ALFA-EPBX 20000 UNIT/ML IJ SOLN: 50000.0000 [IU] | Freq: Once | INTRAMUSCULAR | Status: DC

## 2022-08-10 MED ORDER — EPOETIN ALFA-EPBX 40000 UNIT/ML IJ SOLN
40000.0000 [IU] | Freq: Once | INTRAMUSCULAR | Status: AC
  Administered 2022-08-10: 40000 [IU] via SUBCUTANEOUS
  Filled 2022-08-10: qty 1

## 2022-08-11 ENCOUNTER — Telehealth: Payer: Self-pay | Admitting: Family Medicine

## 2022-08-11 MED ORDER — CLOPIDOGREL BISULFATE 75 MG PO TABS: 75.0000 mg | ORAL_TABLET | Freq: Every day | ORAL | 0 refills | Status: DC

## 2022-08-11 NOTE — Telephone Encounter (Signed)
Lost his plavix. Has been out 3 weeks. Will send new Rx- please tell pharmacy [facility] to fill

## 2022-08-11 NOTE — Telephone Encounter (Signed)
Left message for patient to give our office a call back to discuss Dr Durenda Age recommendations.    OK for P[facility] to give note if patient calls back.

## 2022-08-15 ENCOUNTER — Other Ambulatory Visit: Payer: Self-pay | Admitting: Family Medicine

## 2022-08-15 NOTE — Telephone Encounter (Signed)
Medication Refill - Medication: omeprazole 40 mg  Has the patient contacted their pharmacy? yes (Agent: If no, request that the patient contact the pharmacy for the refill. If patient does not wish to contact the pharmacy document the reason why and proceed with request.) (Agent: If yes, when and what did the pharmacy advise?)  Preferred Pharmacy (with phone number or street name): [facility] Has the patient been seen for an appointment in the last year OR does the patient have an upcoming appointment? Yes.    Agent: Please be advised that RX refills may take up to 3 business days. We ask that you follow-up with your pharmacy.

## 2022-08-16 ENCOUNTER — Other Ambulatory Visit: Payer: Self-pay | Admitting: Family Medicine

## 2022-08-16 MED ORDER — OMEPRAZOLE 40 MG PO CPDR: 40.0000 mg | DELAYED_RELEASE_CAPSULE | Freq: Every day | ORAL | 1 refills | Status: DC

## 2022-08-16 NOTE — Telephone Encounter (Signed)
Requested Prescriptions  Pending Prescriptions Disp Refills   omeprazole (PRILOSEC) 40 MG capsule 90 capsule 1    Sig: Take 1 capsule (40 mg total) by mouth daily.     Gastroenterology: Proton Pump Inhibitors Passed - 08/15/2022 12:49 PM      Passed - Valid encounter within last 12 months    Recent Outpatient Visits           2 months ago Viral upper respiratory tract infection   [place], Erin E, PA-C   6 months ago Upper respiratory tract infection, unspecified type   [place], [place], DO   6 months ago [place] P, DO   7 months ago [place], DO   9 months ago Benign prostatic hyperplasia with nocturia   [place], Chunky, DO       Future Appointments             In 4 weeks [name], DO [place], [place]   In 3 months Ralene Bathe, MD [place]

## 2022-08-16 NOTE — Telephone Encounter (Signed)
I refused the refill requests for the  Plavix and Omeprazole because they are duplicates.

## 2022-08-17 ENCOUNTER — Inpatient Hospital Stay: Payer: Medicare Other

## 2022-08-17 VITALS — BP 136/73

## 2022-08-17 DIAGNOSIS — D631 Anemia in chronic kidney disease: Secondary | ICD-10-CM

## 2022-08-17 DIAGNOSIS — D473 Essential (hemorrhagic) thrombocythemia: Secondary | ICD-10-CM | POA: Diagnosis not present

## 2022-08-17 DIAGNOSIS — D7581 Myelofibrosis: Secondary | ICD-10-CM

## 2022-08-17 LAB — HEMOGLOBIN AND HEMATOCRIT, BLOOD
HCT: 30.1 % — ABNORMAL LOW (ref 39.0–52.0)
Hemoglobin: 9.3 g/dL — ABNORMAL LOW (ref 13.0–17.0)

## 2022-08-17 MED ORDER — EPOETIN ALFA-EPBX 10000 UNIT/ML IJ SOLN
10000.0000 [IU] | Freq: Once | INTRAMUSCULAR | Status: AC
  Administered 2022-08-17: 10000 [IU] via SUBCUTANEOUS
  Filled 2022-08-17: qty 1

## 2022-08-17 MED ORDER — EPOETIN ALFA-EPBX 20000 UNIT/ML IJ SOLN: 50000.0000 [IU] | Freq: Once | INTRAMUSCULAR | Status: DC

## 2022-08-17 MED ORDER — EPOETIN ALFA-EPBX 40000 UNIT/ML IJ SOLN
40000.0000 [IU] | Freq: Once | INTRAMUSCULAR | Status: AC
  Administered 2022-08-17: 40000 [IU] via SUBCUTANEOUS
  Filled 2022-08-17: qty 1

## 2022-08-17 NOTE — Telephone Encounter (Signed)
Spoke with patient local pharmacy [facility] and was informed patient picked up prescription of Plavix on 08/11/22.

## 2022-08-24 ENCOUNTER — Telehealth: Payer: Self-pay | Admitting: *Deleted

## 2022-08-24 ENCOUNTER — Other Ambulatory Visit: Payer: Self-pay | Admitting: *Deleted

## 2022-08-24 ENCOUNTER — Inpatient Hospital Stay: Payer: Medicare Other

## 2022-08-24 DIAGNOSIS — D7581 Myelofibrosis: Secondary | ICD-10-CM | POA: Diagnosis not present

## 2022-08-24 DIAGNOSIS — N183 Chronic kidney disease, stage 3 unspecified: Secondary | ICD-10-CM

## 2022-08-24 DIAGNOSIS — D473 Essential (hemorrhagic) thrombocythemia: Secondary | ICD-10-CM | POA: Diagnosis not present

## 2022-08-24 LAB — COMPREHENSIVE METABOLIC PANEL
ALT: 20 U/L (ref 0–44)
AST: 23 U/L (ref 15–41)
Albumin: 3.9 g/dL (ref 3.5–5.0)
Alkaline Phosphatase: 78 U/L (ref 38–126)
Anion gap: 6 (ref 5–15)
BUN: 37 mg/dL — ABNORMAL HIGH (ref 8–23)
CO2: 22 mmol/L (ref 22–32)
Calcium: 8.5 mg/dL — ABNORMAL LOW (ref 8.9–10.3)
Chloride: 111 mmol/L (ref 98–111)
Creatinine, Ser: 2.04 mg/dL — ABNORMAL HIGH (ref 0.61–1.24)
GFR, Estimated: 32 mL/min — ABNORMAL LOW (ref >60)
Glucose, Bld: 138 mg/dL — ABNORMAL HIGH (ref 70–99)
Potassium: 6.1 mmol/L — ABNORMAL HIGH (ref 3.5–5.1)
Sodium: 139 mmol/L (ref 135–145)
Total Bilirubin: 0.9 mg/dL (ref 0.3–1.2)
Total Protein: 6.4 g/dL — ABNORMAL LOW (ref 6.5–8.1)

## 2022-08-24 LAB — IRON AND TIBC
Iron: 48 ug/dL (ref 45–182)
Saturation Ratios: 17 % — ABNORMAL LOW (ref 17.9–39.5)
TIBC: 287 ug/dL (ref 250–450)
UIBC: 239 ug/dL

## 2022-08-24 LAB — CBC
HCT: 33.7 % — ABNORMAL LOW (ref 39.0–52.0)
Hemoglobin: 10.2 g/dL — ABNORMAL LOW (ref 13.0–17.0)
MCH: 28.3 pg (ref 26.0–34.0)
MCHC: 30.3 g/dL (ref 30.0–36.0)
MCV: 93.6 fL (ref 80.0–100.0)
Platelets: 174 10*3/uL (ref 150–400)
RBC: 3.6 MIL/uL — ABNORMAL LOW (ref 4.22–5.81)
RDW: 19.3 % — ABNORMAL HIGH (ref 11.5–15.5)
WBC: 3.4 10*3/uL — ABNORMAL LOW (ref 4.0–10.5)
nRBC: 4.1 % — ABNORMAL HIGH (ref 0.0–0.2)

## 2022-08-24 LAB — FERRITIN: Ferritin: 73 ng/mL (ref 24–336)

## 2022-08-24 MED ORDER — VELTASSA 8.4 G PO PACK: 8.4000 g | PACK | Freq: Every day | ORAL | 0 refills | Status: DC

## 2022-08-24 NOTE — Telephone Encounter (Signed)
Pharmacy called reporting that they do not have the Valtassa in stock, they would have to order a box of 30 packets and will not break tohe box up to only give him 5 packets, It costs $1000 and his co pay will be $436. She is asking if you want to order something else ie kaexelate. Or what you want to do. Please advise

## 2022-08-24 NOTE — Progress Notes (Signed)
Today hgb 10.2, per MD note hold Retacrit if Hgb >10. Patient informed, discharged & stable

## 2022-08-24 NOTE — Telephone Encounter (Signed)
Pt . Potassium 6.1 today. That is not good for kidneys. Dr/ Janese Banks wants pt to have veltassa daily for 5 days and I was going to refer to kidney doctor. The wife has this issue also and wants the pt to get Dr. Lovena Neighbours in [place].

## 2022-08-25 ENCOUNTER — Other Ambulatory Visit: Payer: Self-pay | Admitting: *Deleted

## 2022-08-25 ENCOUNTER — Telehealth: Payer: Self-pay | Admitting: *Deleted

## 2022-08-25 DIAGNOSIS — E875 Hyperkalemia: Secondary | ICD-10-CM

## 2022-08-25 DIAGNOSIS — D631 Anemia in chronic kidney disease: Secondary | ICD-10-CM

## 2022-08-25 DIAGNOSIS — D7581 Myelofibrosis: Secondary | ICD-10-CM

## 2022-08-25 NOTE — Telephone Encounter (Signed)
Called the wife and she had called me earlier today and she wanted to see if there was any other drug that pt cna use to get the potassium lower. [facility] has the generic of Kayexalate and he will take 15 gm daily for 4 days. Also I spoke to wife about he needs to come back 3/27 and he already has an appt to come in so I also put in potassium level to see if the number coming down. Also yest. They came over and I gave a list of foods that he should not eat. I spoke to wife about the kidney ref. Also . Yest. When I spoke to her she wanted to have pt to see DR. Winter in [place]. When I called the office the doctor is urology and that means I need to get kidney MD. Wife says that she sees Dr. Juleen China. I am sending a request to get ref to him

## 2022-08-25 NOTE — Telephone Encounter (Signed)
Faxed the referral for Dr. Juleen China for abnormal potassium and creat. Levels. The fax went through.

## 2022-08-26 ENCOUNTER — Encounter: Payer: Self-pay | Admitting: Oncology

## 2022-08-29 ENCOUNTER — Telehealth: Payer: Self-pay | Admitting: Family Medicine

## 2022-08-29 ENCOUNTER — Encounter: Payer: Self-pay | Admitting: Oncology

## 2022-08-29 NOTE — Telephone Encounter (Signed)
Contacted [name] to schedule their annual wellness visit. Appointment made for 09/26/2022.  [name]; [facility] Direct Dial: [phone]

## 2022-08-30 ENCOUNTER — Telehealth: Payer: Self-pay | Admitting: *Deleted

## 2022-08-30 DIAGNOSIS — M170 Bilateral primary osteoarthritis of knee: Secondary | ICD-10-CM | POA: Diagnosis not present

## 2022-08-30 NOTE — Telephone Encounter (Signed)
Rn contacted [facility] [place] [facility] to f/u on referral to Dr. Juleen China. Per office, Dr. Juleen China is out of the office and office does not have any available apts with Dr. Juleen China until June. If patient is willing, office can schedule apts with one of Dr. Assunta Gambles office partners and if pt insists later, he could switch over to Dr. Assunta Gambles care.  I called patient personally and discuss this referral. He is ok with seeing a different nephrologist until Dr. Juleen China would be availble. He preferred apts to be mid week and only in the afternoon.  I called the nephrology office back. Apt given for May 15th at 120 pm under the care of Dr. Murlean Iba. Office will mail him new patient paperwork  I called pt back and left a vm with this apt time. Pt has an apt for lab recheck tomorrow. If I'm unable to reach patient today, we can provide pt with this nephrology apt when he comes in for lab/injection.

## 2022-08-31 ENCOUNTER — Inpatient Hospital Stay: Payer: Medicare Other

## 2022-08-31 ENCOUNTER — Other Ambulatory Visit: Payer: Self-pay | Admitting: *Deleted

## 2022-08-31 ENCOUNTER — Telehealth: Payer: Self-pay | Admitting: *Deleted

## 2022-08-31 DIAGNOSIS — D7581 Myelofibrosis: Secondary | ICD-10-CM

## 2022-08-31 DIAGNOSIS — M5412 Radiculopathy, cervical region: Secondary | ICD-10-CM | POA: Diagnosis not present

## 2022-08-31 DIAGNOSIS — M9901 Segmental and somatic dysfunction of cervical region: Secondary | ICD-10-CM | POA: Diagnosis not present

## 2022-08-31 DIAGNOSIS — E875 Hyperkalemia: Secondary | ICD-10-CM

## 2022-08-31 DIAGNOSIS — M9903 Segmental and somatic dysfunction of lumbar region: Secondary | ICD-10-CM | POA: Diagnosis not present

## 2022-08-31 DIAGNOSIS — M5033 Other cervical disc degeneration, cervicothoracic region: Secondary | ICD-10-CM | POA: Diagnosis not present

## 2022-08-31 DIAGNOSIS — N183 Chronic kidney disease, stage 3 unspecified: Secondary | ICD-10-CM

## 2022-08-31 DIAGNOSIS — D473 Essential (hemorrhagic) thrombocythemia: Secondary | ICD-10-CM | POA: Diagnosis not present

## 2022-08-31 LAB — POTASSIUM: Potassium: 5.1 mmol/L (ref 3.5–5.1)

## 2022-08-31 LAB — HEMOGLOBIN AND HEMATOCRIT, BLOOD
HCT: 33.2 % — ABNORMAL LOW (ref 39.0–52.0)
Hemoglobin: 10.1 g/dL — ABNORMAL LOW (ref 13.0–17.0)

## 2022-08-31 MED ORDER — SODIUM POLYSTYRENE SULFONATE 15 GM/60ML PO SUSP: 15.0000 g | Freq: Every day | ORAL | 0 refills | Status: DC

## 2022-08-31 NOTE — Telephone Encounter (Signed)
Called the patient after speaking to him in the clinic. His potassium is 5.1 which came down some but he needs for [facility] and 1dose every day or 3 doses. He is ok with this and he is coming next wed. And I have added met b to be added with his other labs. Pt agreeable and I told him about the appt with kidney doctor  and he will get the date next week when he comes

## 2022-08-31 NOTE — Progress Notes (Signed)
Pt hgb 10.1 no retacrit today

## 2022-09-07 ENCOUNTER — Inpatient Hospital Stay: Payer: Medicare Other | Attending: Oncology

## 2022-09-07 ENCOUNTER — Inpatient Hospital Stay: Payer: Medicare Other

## 2022-09-07 VITALS — BP 139/71 | HR 61

## 2022-09-07 DIAGNOSIS — D473 Essential (hemorrhagic) thrombocythemia: Secondary | ICD-10-CM | POA: Insufficient documentation

## 2022-09-07 DIAGNOSIS — E875 Hyperkalemia: Secondary | ICD-10-CM

## 2022-09-07 DIAGNOSIS — D7581 Myelofibrosis: Secondary | ICD-10-CM | POA: Insufficient documentation

## 2022-09-07 DIAGNOSIS — D631 Anemia in chronic kidney disease: Secondary | ICD-10-CM | POA: Diagnosis not present

## 2022-09-07 DIAGNOSIS — N183 Chronic kidney disease, stage 3 unspecified: Secondary | ICD-10-CM | POA: Insufficient documentation

## 2022-09-07 LAB — BASIC METABOLIC PANEL
Anion gap: 6 (ref 5–15)
BUN: 41 mg/dL — ABNORMAL HIGH (ref 8–23)
CO2: 24 mmol/L (ref 22–32)
Calcium: 8 mg/dL — ABNORMAL LOW (ref 8.9–10.3)
Chloride: 110 mmol/L (ref 98–111)
Creatinine, Ser: 2.09 mg/dL — ABNORMAL HIGH (ref 0.61–1.24)
GFR, Estimated: 31 mL/min — ABNORMAL LOW (ref >60)
Glucose, Bld: 113 mg/dL — ABNORMAL HIGH (ref 70–99)
Potassium: 5.5 mmol/L — ABNORMAL HIGH (ref 3.5–5.1)
Sodium: 140 mmol/L (ref 135–145)

## 2022-09-07 LAB — HEMOGLOBIN AND HEMATOCRIT, BLOOD
HCT: 30.5 % — ABNORMAL LOW (ref 39.0–52.0)
Hemoglobin: 9.5 g/dL — ABNORMAL LOW (ref 13.0–17.0)

## 2022-09-07 MED ORDER — EPOETIN ALFA-EPBX 20000 UNIT/ML IJ SOLN: 50000.0000 [IU] | Freq: Once | INTRAMUSCULAR | Status: DC

## 2022-09-07 MED ORDER — EPOETIN ALFA-EPBX 10000 UNIT/ML IJ SOLN
10000.0000 [IU] | Freq: Once | INTRAMUSCULAR | Status: AC
  Administered 2022-09-07: 10000 [IU] via SUBCUTANEOUS
  Filled 2022-09-07: qty 1

## 2022-09-07 MED ORDER — EPOETIN ALFA-EPBX 40000 UNIT/ML IJ SOLN
40000.0000 [IU] | Freq: Once | INTRAMUSCULAR | Status: AC
  Administered 2022-09-07: 40000 [IU] via SUBCUTANEOUS
  Filled 2022-09-07: qty 1

## 2022-09-09 ENCOUNTER — Ambulatory Visit: Payer: Medicare Other | Admitting: Family Medicine

## 2022-09-13 ENCOUNTER — Encounter: Payer: Self-pay | Admitting: Family Medicine

## 2022-09-13 ENCOUNTER — Ambulatory Visit: Payer: Medicare Other | Admitting: Family Medicine

## 2022-09-13 ENCOUNTER — Ambulatory Visit (INDEPENDENT_AMBULATORY_CARE_PROVIDER_SITE_OTHER): Payer: Medicare Other | Admitting: Family Medicine

## 2022-09-13 VITALS — BP 138/70 | HR 65 | Temp 98.6°F | Ht 68.0 in | Wt 182.2 lb

## 2022-09-13 DIAGNOSIS — N1832 Chronic kidney disease, stage 3b: Secondary | ICD-10-CM | POA: Insufficient documentation

## 2022-09-13 DIAGNOSIS — E782 Mixed hyperlipidemia: Secondary | ICD-10-CM | POA: Diagnosis not present

## 2022-09-13 DIAGNOSIS — K219 Gastro-esophageal reflux disease without esophagitis: Secondary | ICD-10-CM

## 2022-09-13 DIAGNOSIS — R454 Irritability and anger: Secondary | ICD-10-CM

## 2022-09-13 DIAGNOSIS — I129 Hypertensive chronic kidney disease with stage 1 through stage 4 chronic kidney disease, or unspecified chronic kidney disease: Secondary | ICD-10-CM

## 2022-09-13 DIAGNOSIS — R7989 Other specified abnormal findings of blood chemistry: Secondary | ICD-10-CM | POA: Diagnosis not present

## 2022-09-13 DIAGNOSIS — N401 Enlarged prostate with lower urinary tract symptoms: Secondary | ICD-10-CM

## 2022-09-13 DIAGNOSIS — D7581 Myelofibrosis: Secondary | ICD-10-CM

## 2022-09-13 DIAGNOSIS — R252 Cramp and spasm: Secondary | ICD-10-CM | POA: Diagnosis not present

## 2022-09-13 DIAGNOSIS — Z0000 Encounter for general adult medical examination without abnormal findings: Secondary | ICD-10-CM | POA: Diagnosis not present

## 2022-09-13 DIAGNOSIS — D649 Anemia, unspecified: Secondary | ICD-10-CM | POA: Diagnosis not present

## 2022-09-13 DIAGNOSIS — E038 Other specified hypothyroidism: Secondary | ICD-10-CM

## 2022-09-13 DIAGNOSIS — R351 Nocturia: Secondary | ICD-10-CM

## 2022-09-13 LAB — URINALYSIS, ROUTINE W REFLEX MICROSCOPIC
Bilirubin, UA: NEGATIVE
Glucose, UA: NEGATIVE
Ketones, UA: NEGATIVE
Leukocytes,UA: NEGATIVE
Nitrite, UA: NEGATIVE
Specific Gravity, UA: 1.02 (ref 1.005–1.030)
Urobilinogen, Ur: 1 mg/dL (ref 0.2–1.0)
pH, UA: 5.5 (ref 5.0–7.5)

## 2022-09-13 LAB — MICROALBUMIN, URINE WAIVED
Creatinine, Urine Waived: 100 mg/dL (ref 10–300)
Microalb, Ur Waived: 150 mg/L — ABNORMAL HIGH (ref 0–19)
Microalb/Creat Ratio: >300 mg/g — ABNORMAL HIGH (ref <30)

## 2022-09-13 LAB — MICROSCOPIC EXAMINATION
Bacteria, UA: NONE SEEN
WBC, UA: NONE SEEN /hpf (ref 0–5)

## 2022-09-13 MED ORDER — SIMVASTATIN 40 MG PO TABS: 40.0000 mg | ORAL_TABLET | Freq: Every day | ORAL | 1 refills | Status: DC

## 2022-09-13 MED ORDER — LOSARTAN POTASSIUM 100 MG PO TABS: 100.0000 mg | ORAL_TABLET | Freq: Every morning | ORAL | 1 refills | Status: DC

## 2022-09-13 MED ORDER — TADALAFIL 5 MG PO TABS: 5.0000 mg | ORAL_TABLET | Freq: Every day | ORAL | 3 refills | Status: DC

## 2022-09-13 MED ORDER — DULOXETINE HCL 20 MG PO CPEP: ORAL_CAPSULE | ORAL | 1 refills | Status: DC

## 2022-09-13 MED ORDER — CLOPIDOGREL BISULFATE 75 MG PO TABS: 75.0000 mg | ORAL_TABLET | Freq: Every day | ORAL | 1 refills | Status: DC

## 2022-09-13 MED ORDER — TAMSULOSIN HCL 0.4 MG PO CAPS: 0.8000 mg | ORAL_CAPSULE | Freq: Every day | ORAL | 1 refills | Status: DC

## 2022-09-13 MED ORDER — GABAPENTIN 400 MG PO CAPS: ORAL_CAPSULE | ORAL | 1 refills | Status: DC

## 2022-09-13 MED ORDER — OMEPRAZOLE 40 MG PO CPDR: 40.0000 mg | DELAYED_RELEASE_CAPSULE | Freq: Every day | ORAL | 1 refills | Status: DC

## 2022-09-13 MED ORDER — AMLODIPINE BESYLATE 10 MG PO TABS: 10.0000 mg | ORAL_TABLET | Freq: Every evening | ORAL | 1 refills | Status: DC

## 2022-09-13 MED ORDER — ALBUTEROL SULFATE HFA 108 (90 BASE) MCG/ACT IN AERS: 2.0000 | INHALATION_SPRAY | Freq: Four times a day (QID) | RESPIRATORY_TRACT | 12 refills | Status: DC | PRN

## 2022-09-13 NOTE — Assessment & Plan Note (Signed)
Continue to follow with hematology. Continue to monitor. Call with any concerns.

## 2022-09-13 NOTE — Progress Notes (Signed)
BP 138/70   Pulse 65   Temp 98.6 F (37 C)   Ht 5\' 8"  (1.727 m)   Wt 182 lb 3.2 oz (82.6 kg)   SpO2 97%   BMI 27.70 kg/m    Subjective:    Patient ID: [name], male    DOB: 11/11/1940, 82 y.o.   MRN: [id number]  HPI: [name] is a 82 y.o. male presenting on 09/13/2022 for comprehensive medical examination. Current medical complaints include:  HYPERTENSION / HYPERLIPIDEMIA Satisfied with current treatment? yes Duration of hypertension: chronic BP monitoring frequency: not checking BP medication side effects: no Past BP meds: amlodipine, losartan Duration of hyperlipidemia: chronic Cholesterol medication side effects: no Cholesterol supplements: none Past cholesterol medications: simvastatin Medication compliance: excellent compliance Aspirin: no Recent stressors: no Recurrent headaches: no Visual changes: no Palpitations: no Dyspnea: no Chest pain: no Lower extremity edema: no Dizzy/lightheaded: no  HYPOTHYROIDISM Thyroid control status:controlled Satisfied with current treatment? yes Medication side effects: N/A Fatigue: yes Cold intolerance: no Heat intolerance: no Weight gain: no Weight loss: no Constipation: no Diarrhea/loose stools: no Palpitations: no Lower extremity edema: no Anxiety/depressed mood: no  DEPRESSION Mood status: controlled Satisfied with current treatment?: yes Symptom severity: mild  Duration of current treatment : chronic Side effects: no Medication compliance: excellent compliance Psychotherapy/counseling: no  Previous psychiatric medications: cymbalta Depressed mood: no Anxious mood: no Anhedonia: no Significant weight loss or gain: no Insomnia: no  Fatigue: yes Feelings of worthlessness or guilt: no Impaired concentration/indecisiveness: no Suicidal ideations: no Hopelessness: no Crying spells: no    09/13/2022    1:43 PM 06/03/2022    9:15 AM 01/05/2022   11:03 AM 11/02/2021   10:01 AM 10/12/2021    2:39 PM   Depression screen PHQ 2/9  Decreased Interest 0 0 0 0 0  Down, Depressed, Hopeless 0 0 0 0 0  PHQ - 2 Score 0 0 0 0 0  Altered sleeping 0 0 0 1 1  Tired, decreased energy 2 1 1  0 1  Change in appetite 0 0 0 0 0  Feeling bad or failure about yourself  0 0 0 0 0  Trouble concentrating 0 0 0 0 0  Moving slowly or fidgety/restless 0 0 0 0 0  Suicidal thoughts 0 0 0 0 0  PHQ-9 Score 2 1 1 1 2   Difficult doing work/chores Not difficult at all Not difficult at all Not difficult at all Not difficult at all      He currently lives with: wife Interim Problems from his last visit: no  Functional Status Survey: Is the patient deaf or have difficulty hearing?: No Does the patient have difficulty seeing, even when wearing glasses/contacts?: No Does the patient have difficulty concentrating, remembering, or making decisions?: No Does the patient have difficulty walking or climbing stairs?: No Does the patient have difficulty dressing or bathing?: No Does the patient have difficulty doing errands alone such as visiting a doctor's office or shopping?: No  FALL RISK:    09/13/2022    1:43 PM 06/03/2022    9:15 AM 11/02/2021   10:01 AM 10/12/2021    2:39 PM 09/15/2021   11:26 AM  Fall Risk   Falls in the past year? 1 1 0 1 0  Number falls in past yr: 0 1 0 1 0  Injury with Fall? 0 0 0 0 0  Risk for fall due to : History of fall(s) History of fall(s) No Fall Risks History of  fall(s) No Fall Risks  Follow up Falls evaluation completed Falls evaluation completed Falls evaluation completed Falls evaluation completed Falls evaluation completed    Depression Screen    09/13/2022    1:43 PM 06/03/2022    9:15 AM 01/05/2022   11:03 AM 11/02/2021   10:01 AM 10/12/2021    2:39 PM  Depression screen PHQ 2/9  Decreased Interest 0 0 0 0 0  Down, Depressed, Hopeless 0 0 0 0 0  PHQ - 2 Score 0 0 0 0 0  Altered sleeping 0 0 0 1 1  Tired, decreased energy 2 1 1  0 1  Change in appetite 0 0 0 0 0  Feeling  bad or failure about yourself  0 0 0 0 0  Trouble concentrating 0 0 0 0 0  Moving slowly or fidgety/restless 0 0 0 0 0  Suicidal thoughts 0 0 0 0 0  PHQ-9 Score 2 1 1 1 2   Difficult doing work/chores Not difficult at all Not difficult at all Not difficult at all Not difficult at all     Advanced Directives Does patient have a HCPOA?    yes Does patient have a living will or MOST form?  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    Surgical History:  Past Surgical History:  Procedure Laterality Date   ASPIRATION / INJECTION RENAL CYST  Feb 2017   BACK SURGERY     approx 20- 25 years ago   GAS INSERTION  08/11/2011   Procedure: INSERTION OF GAS;  Surgeon: Sherrie George, MD;  Location: [facility] OR;  Service: Ophthalmology;  Laterality: Right;  [id number]   SCLERAL BUCKLE  08/11/2011   Procedure: SCLERAL BUCKLE;  Surgeon: Sherrie George, MD;  Location: [facility] OR;  Service: Ophthalmology;  Laterality: Right;   VARICOSE VEIN SURGERY      Medications:  Current Outpatient Medications on File Prior to Visit  Medication Sig   azelastine (ASTELIN) 0.1 % nasal spray Place 1 spray into both nostrils 2 (two) times daily. Use in each nostril as directed   calcium carbonate (OSCAL) 1500 (600 Ca) MG TABS tablet Take by mouth.   Cholecalciferol (D3-1000 PO) Take by mouth daily.   Ginsengs-Saw Palmetto (MULTI GINSENG & SAW PALMETTO) 500 MG CAPS Take 500 mg by mouth daily.   meclizine (ANTIVERT) 25 MG tablet TAKE 1 TABLET BY MOUTH 3 TIMES DAILY AS NEEDED FOR DIZZINESS   Multiple Vitamin (MULITIVITAMIN WITH MINERALS) TABS Take 1 tablet by  mouth daily.   Naproxen Sodium 220 MG CAPS Take by mouth as needed.   patiromer (VELTASSA) 8.4 g packet Take 1 packet (8.4 g total) by mouth daily.   zinc gluconate 50 MG tablet Take by mouth.   Current Facility-Administered Medications on File Prior to Visit  Medication   epoetin alfa-epbx (RETACRIT) injection 10,000 Units   epoetin alfa-epbx (RETACRIT) injection 40,000 Units    Allergies:  Allergies  Allergen Reactions   Meloxicam Nausea And Vomiting    Social History:  Social History  Socioeconomic History   Marital status: Married    Spouse name: [name]    Number of children: 2   Years of education: 12+   Highest education level: Some college, no degree  Occupational History   Occupation: Fish farm manager: OTHER    Comment: community   Occupation: SELF EMPLOYED    Employer: SELF EMPLOYED  Tobacco Use   Smoking status: Former    Packs/day: .25    Types: Cigarettes    Quit date: 06/06/2010    Years since quitting: 12.2   Smokeless tobacco: Never  Vaping Use   Vaping Use: Never used  Substance and Sexual Activity   Alcohol use: No    Alcohol/week: 0.0 standard drinks of alcohol   Drug use: No   Sexual activity: Not Currently  Other Topics Concern   Not on file  Social History Narrative   Pt lives at home with his family.   Caffeine Use- 2 cups daily   Patient has 2 children.    Patient has some college.    Patient is right handed.          Works full time   Chemical engineer Strain: Low Risk  (07/12/2021)   Overall Financial Resource Strain (CARDIA)    Difficulty of Paying Living Expenses: Not hard at all  Food Insecurity: No Food Insecurity (07/12/2021)   Hunger Vital Sign    Worried About Running Out of Food in the Last Year: Never true    Ran Out of Food in the Last Year: Never true  Transportation Needs: No Transportation Needs (07/12/2021)   PRAPARE - Administrator, Civil Service (Medical): No    Lack  of Transportation (Non-Medical): No  Physical Activity: Inactive (07/12/2021)   Exercise Vital Sign    Days of Exercise per Week: 0 days    Minutes of Exercise per Session: 0 min  Stress: No Stress Concern Present (07/12/2021)   [employer] of Occupational Health - Occupational Stress Questionnaire    Feeling of Stress : Not at all  Social Connections: Socially Integrated (07/12/2021)   Social Connection and Isolation Panel [NHANES]    Frequency of Communication with Friends and Family: Three times a week    Frequency of Social Gatherings with Friends and Family: More than three times a week    Attends Religious Services: More than 4 times per year    Active Member of [employer] or Organizations: Yes    Attends Engineer, structural: More than 4 times per year    Marital Status: Married  Catering manager Violence: Not At Risk (07/12/2021)   Humiliation, Afraid, Rape, and Kick questionnaire    Fear of Current or Ex-Partner: No    Emotionally Abused: No    Physically Abused: No    Sexually Abused: No   Social History   Tobacco Use  Smoking Status Former   Packs/day: .25   Types: Cigarettes   Quit date: 06/06/2010   Years since quitting: 12.2  Smokeless Tobacco Never   Social History   Substance and Sexual Activity  Alcohol Use No   Alcohol/week: 0.0 standard drinks of alcohol    Family History:  Family History  Problem Relation Age of Onset   Heart disease Father    Diabetes Son    Kidney disease Neg Hx    Prostate cancer Neg Hx     Past medical history, surgical history, medications, allergies, family history and social  history reviewed with patient today and changes made to appropriate areas of the chart.   Review of Systems  Constitutional: Negative.   HENT:  Positive for hearing loss. Negative for congestion, ear discharge, ear pain, nosebleeds, sinus pain, sore throat and tinnitus.   Eyes:  Positive for blurred vision. Negative for double vision, photophobia,  pain, discharge and redness.  Respiratory: Negative.  Negative for stridor.   Cardiovascular: Negative.   Gastrointestinal:  Positive for diarrhea. Negative for abdominal pain, blood in stool, constipation, heartburn, melena, nausea and vomiting.  Genitourinary: Negative.   Musculoskeletal:  Positive for myalgias. Negative for back pain, falls, joint pain and neck pain.  Skin: Negative.   Neurological:  Positive for dizziness and weakness. Negative for tingling, tremors, sensory change, speech change, focal weakness, seizures, loss of consciousness and headaches.  Endo/Heme/Allergies:  Positive for environmental allergies. Negative for polydipsia. Bruises/bleeds easily.  Psychiatric/Behavioral: Negative.     All other ROS negative except what is listed above and in the HPI.      Objective:    BP 138/70   Pulse 65   Temp 98.6 F (37 C)   Ht 5\' 8"  (1.727 m)   Wt 182 lb 3.2 oz (82.6 kg)   SpO2 97%   BMI 27.70 kg/m   Wt Readings from Last 3 Encounters:  09/13/22 182 lb 3.2 oz (82.6 kg)  07/27/22 180 lb 9.6 oz (81.9 kg)  06/03/22 182 lb 9.6 oz (82.8 kg)     Physical Exam Vitals and nursing note reviewed.  Constitutional:      General: He is not in acute distress.    Appearance: Normal appearance. He is not ill-appearing, toxic-appearing or diaphoretic.  HENT:     Head: Normocephalic and atraumatic.     Right Ear: Tympanic membrane, ear canal and external ear normal. There is no impacted cerumen.     Left Ear: Tympanic membrane, ear canal and external ear normal. There is no impacted cerumen.     Nose: Nose normal. No congestion or rhinorrhea.     Mouth/Throat:     Mouth: Mucous membranes are moist.     Pharynx: Oropharynx is clear. No oropharyngeal exudate or posterior oropharyngeal erythema.  Eyes:     General: No scleral icterus.       Right eye: No discharge.        Left eye: No discharge.     Extraocular Movements: Extraocular movements intact.     Conjunctiva/sclera:  Conjunctivae normal.     Pupils: Pupils are equal, round, and reactive to light.  Neck:     Vascular: No carotid bruit.  Cardiovascular:     Rate and Rhythm: Normal rate and regular rhythm.     Pulses: Normal pulses.     Heart sounds: No murmur heard.    No friction rub. No gallop.  Pulmonary:     Effort: Pulmonary effort is normal. No respiratory distress.     Breath sounds: Normal breath sounds. No stridor. No wheezing, rhonchi or rales.  Chest:     Chest wall: No tenderness.  Abdominal:     General: Abdomen is flat. Bowel sounds are normal. There is no distension.     Palpations: Abdomen is soft. There is no mass.     Tenderness: There is no abdominal tenderness. There is no right CVA tenderness, left CVA tenderness, guarding or rebound.     Hernia: No hernia is present.  Genitourinary:    Comments: Genital exam deferred with shared decision  making Musculoskeletal:        General: No swelling, tenderness, deformity or signs of injury.     Cervical back: Normal range of motion and neck supple. No rigidity. No muscular tenderness.     Right lower leg: No edema.     Left lower leg: No edema.  Lymphadenopathy:     Cervical: No cervical adenopathy.  Skin:    General: Skin is warm and dry.     Capillary Refill: Capillary refill takes less than 2 seconds.     Coloration: Skin is not jaundiced or pale.     Findings: No bruising, erythema, lesion or rash.  Neurological:     General: No focal deficit present.     Mental Status: He is alert and oriented to person, place, and time.     Cranial Nerves: No cranial nerve deficit.     Sensory: No sensory deficit.     Motor: No weakness.     Coordination: Coordination normal.     Gait: Gait normal.     Deep Tendon Reflexes: Reflexes normal.  Psychiatric:        Mood and Affect: Mood normal.        Behavior: Behavior normal.        Thought Content: Thought content normal.        Judgment: Judgment normal.        09/13/2022    2:19  PM 07/10/2020    9:06 AM 01/24/2018    4:05 PM 01/18/2017    3:14 PM  6CIT Screen  What Year? 0 points 0 points 0 points 0 points  What month? 0 points 0 points 0 points 0 points  What time? 0 points 0 points 0 points 0 points  Count back from 20 0 points 0 points 0 points 0 points  Months in reverse 0 points 2 points 0 points 0 points  Repeat phrase 0 points 0 points 0 points 2 points  Total Score 0 points 2 points 0 points 2 points     Results for orders placed or performed in visit on 09/07/22  Basic metabolic panel  Result Value Ref Range   Sodium 140 135 - 145 mmol/L   Potassium 5.5 (H) 3.5 - 5.1 mmol/L   Chloride 110 98 - 111 mmol/L   CO2 24 22 - 32 mmol/L   Glucose, Bld 113 (H) 70 - 99 mg/dL   BUN 41 (H) 8 - 23 mg/dL   Creatinine, Ser 4.78 (H) 0.61 - 1.24 mg/dL   Calcium 8.0 (L) 8.9 - 10.3 mg/dL   GFR, Estimated 31 (L) >60 mL/min   Anion gap 6 5 - 15  Hemoglobin and Hematocrit, Blood  Result Value Ref Range   Hemoglobin 9.5 (L) 13.0 - 17.0 g/dL   HCT 29.5 (L) 62.1 - 30.8 %      Assessment & Plan:   Problem List Items Addressed This Visit       Digestive   Gastroesophageal reflux disease    Under good control on current regimen. Continue current regimen. Continue to monitor. Call with any concerns. Refills given.  Labs drawn today.       Relevant Medications   omeprazole (PRILOSEC) 40 MG capsule   Other Relevant Orders   Comprehensive metabolic panel   CBC with Differential/Platelet     Endocrine   Subclinical hypothyroidism    Rechecking labs today. Await results. Treat as needed.       Relevant Orders   Comprehensive metabolic panel  CBC with Differential/Platelet   TSH     Genitourinary   BPH (benign prostatic hyperplasia)    Under good control on current regimen. Continue current regimen. Continue to monitor. Call with any concerns. Refills given.       Relevant Medications   tamsulosin (FLOMAX) 0.4 MG CAPS capsule   Benign hypertensive  renal disease    Under good control on current regimen. Continue current regimen. Continue to monitor. Call with any concerns. Refills given.  Labs drawn today.       Relevant Orders   Comprehensive metabolic panel   CBC with Differential/Platelet   Urinalysis, Routine w reflex microscopic   Microalbumin, Urine Waived   CKD stage 3b, GFR 30-44 ml/min    Likely contributing to his muscle spasms. To see nephrology shortly. Rechecking labs today. Await results. Treat as needed.         Other   Myelofibrosis    Continue to follow with hematology. Continue to monitor. Call with any concerns.       Mixed hyperlipidemia    Under good control on current regimen. Continue current regimen. Continue to monitor. Call with any concerns. Refills given. Labs drawn today.        Relevant Medications   amLODipine (NORVASC) 10 MG tablet   losartan (COZAAR) 100 MG tablet   simvastatin (ZOCOR) 40 MG tablet   tadalafil (CIALIS) 5 MG tablet   Other Relevant Orders   Comprehensive metabolic panel   CBC with Differential/Platelet   Lipid Panel w/o Chol/HDL Ratio   Anemia    Continue to follow with hematology. Call with any concerns. Continue to monitor.       Relevant Orders   Comprehensive metabolic panel   CBC with Differential/Platelet   Irritability    Under good control on current regimen. Continue current regimen. Continue to monitor. Call with any concerns. Refills given. Labs drawn today.       Low vitamin B12 level    Rechecking labs today. Await results. Treat as needed.       Relevant Orders   Comprehensive metabolic panel   CBC with Differential/Platelet   B12   Other Visit Diagnoses     Encounter for Medicare annual wellness exam    -  Primary   Preventative care discussed today as below.   Muscle cramps       Likely in part due to CKD. Will get into nephrology and increase his gabapentin to 1200mg  a day. Recheck 1 month. call with any concerns.         Preventative Services:  Health Risk Assessment and Personalized Prevention Plan: Done today Bone Mass Measurements: N/A CVD Screening: Done today Colon Cancer Screening: N/A Depression Screening: Done today Diabetes Screening: Done today Glaucoma Screening: See your eye doctor Hepatitis B vaccine: N/A Hepatitis C screening: up to date HIV Screening: up to date Flu Vaccine: up to date Lung cancer Screening: N/A Obesity Screening: Done today Pneumonia Vaccines (2): Up to date STI Screening: N/A PSA screening: N/A  LABORATORY TESTING:  Health maintenance labs ordered today as discussed above.    IMMUNIZATIONS:   - Tdap: Tetanus vaccination status reviewed: last tetanus booster within 10 years. - Influenza: Up to date - Pneumovax: Up to date - Prevnar: Up to date - Zostavax vaccine: Refused  SCREENING: - Colonoscopy: Not applicable  Discussed with patient purpose of the colonoscopy is to detect colon cancer at curable precancerous or early stages    PATIENT COUNSELING:  Sexuality: Discussed sexually transmitted diseases, partner selection, use of condoms, avoidance of unintended pregnancy  and contraceptive alternatives.   Advised to avoid cigarette smoking.  I discussed with the patient that most people either abstain from alcohol or drink within safe limits (<=14/week and <=4 drinks/occasion for males, <=7/weeks and <= 3 drinks/occasion for females) and that the risk for alcohol disorders and other health effects rises proportionally with the number of drinks per week and how often a drinker exceeds daily limits.  Discussed cessation/primary prevention of drug use and availability of treatment for abuse.   Diet: Encouraged to adjust caloric intake to maintain  or achieve ideal body weight, to reduce intake of dietary saturated fat and total fat, to limit sodium intake by avoiding high sodium foods and not adding table salt, and to maintain adequate dietary potassium  and calcium preferably from fresh fruits, vegetables, and low-fat dairy products.    stressed the importance of regular exercise  Injury prevention: Discussed safety belts, safety helmets, smoke detector, smoking near bedding or upholstery.   Dental health: Discussed importance of regular tooth brushing, flossing, and dental visits.   Follow up plan: NEXT PREVENTATIVE PHYSICAL DUE IN 1 YEAR. Return in about 4 weeks (around 10/11/2022) for follow up muscle cramps.

## 2022-09-13 NOTE — Assessment & Plan Note (Signed)
Under good control on current regimen. Continue current regimen. Continue to monitor. Call with any concerns. Refills given. Labs drawn today.   

## 2022-09-13 NOTE — Assessment & Plan Note (Signed)
Likely contributing to his muscle spasms. To see nephrology shortly. Rechecking labs today. Await results. Treat as needed.

## 2022-09-13 NOTE — Assessment & Plan Note (Signed)
Under good control on current regimen. Continue current regimen. Continue to monitor. Call with any concerns. Refills given.   

## 2022-09-13 NOTE — Assessment & Plan Note (Signed)
Continue to follow with hematology. Call with any concerns. Continue to monitor.  ?

## 2022-09-13 NOTE — Patient Instructions (Signed)
Preventative Services:  Health Risk Assessment and Personalized Prevention Plan: Done today Bone Mass Measurements: N/A CVD Screening: Done today Colon Cancer Screening: N/A Depression Screening: Done today Diabetes Screening: Done today Glaucoma Screening: See your eye doctor Hepatitis B vaccine: N/A Hepatitis C screening: up to date HIV Screening: up to date Flu Vaccine: up to date Lung cancer Screening: N/A Obesity Screening: Done today Pneumonia Vaccines (2): Up to date STI Screening: N/A PSA screening: N/A

## 2022-09-13 NOTE — Assessment & Plan Note (Signed)
Rechecking labs today. Await results. Treat as needed.  °

## 2022-09-14 ENCOUNTER — Inpatient Hospital Stay: Payer: Medicare Other

## 2022-09-14 VITALS — BP 133/67 | HR 64 | Temp 97.4°F | Resp 20

## 2022-09-14 DIAGNOSIS — D7581 Myelofibrosis: Secondary | ICD-10-CM | POA: Diagnosis not present

## 2022-09-14 DIAGNOSIS — N183 Chronic kidney disease, stage 3 unspecified: Secondary | ICD-10-CM

## 2022-09-14 DIAGNOSIS — D631 Anemia in chronic kidney disease: Secondary | ICD-10-CM | POA: Diagnosis not present

## 2022-09-14 DIAGNOSIS — D473 Essential (hemorrhagic) thrombocythemia: Secondary | ICD-10-CM | POA: Diagnosis not present

## 2022-09-14 LAB — COMPREHENSIVE METABOLIC PANEL
ALT: 19 IU/L (ref 0–44)
AST: 21 IU/L (ref 0–40)
Albumin/Globulin Ratio: 2.6 — ABNORMAL HIGH (ref 1.2–2.2)
Albumin: 3.9 g/dL (ref 3.7–4.7)
Alkaline Phosphatase: 83 IU/L (ref 44–121)
BUN/Creatinine Ratio: 15 (ref 10–24)
BUN: 31 mg/dL — ABNORMAL HIGH (ref 8–27)
Bilirubin Total: 0.7 mg/dL (ref 0.0–1.2)
CO2: 20 mmol/L (ref 20–29)
Calcium: 8.5 mg/dL — ABNORMAL LOW (ref 8.6–10.2)
Chloride: 108 mmol/L — ABNORMAL HIGH (ref 96–106)
Creatinine, Ser: 2.04 mg/dL — ABNORMAL HIGH (ref 0.76–1.27)
Globulin, Total: 1.5 g/dL (ref 1.5–4.5)
Glucose: 90 mg/dL (ref 70–99)
Potassium: 6.1 mmol/L — ABNORMAL HIGH (ref 3.5–5.2)
Sodium: 141 mmol/L (ref 134–144)
Total Protein: 5.4 g/dL — ABNORMAL LOW (ref 6.0–8.5)
eGFR: 32 mL/min/{1.73_m2} — ABNORMAL LOW (ref >59)

## 2022-09-14 LAB — CBC WITH DIFFERENTIAL/PLATELET
Basophils Absolute: 0 10*3/uL (ref 0.0–0.2)
Basos: 0 %
EOS (ABSOLUTE): 0 10*3/uL (ref 0.0–0.4)
Eos: 1 %
Hematocrit: 28.6 % — ABNORMAL LOW (ref 37.5–51.0)
Hemoglobin: 9 g/dL — ABNORMAL LOW (ref 13.0–17.7)
Lymphocytes Absolute: 0.5 10*3/uL — ABNORMAL LOW (ref 0.7–3.1)
Lymphs: 15 %
MCH: 28.2 pg (ref 26.6–33.0)
MCHC: 31.5 g/dL (ref 31.5–35.7)
MCV: 90 fL (ref 79–97)
Monocytes Absolute: 0.4 10*3/uL (ref 0.1–0.9)
Monocytes: 11 %
NRBC: 3 % — ABNORMAL HIGH (ref 0–0)
Neutrophils Absolute: 2.3 10*3/uL (ref 1.4–7.0)
Neutrophils: 65 %
Platelets: 220 10*3/uL (ref 150–450)
RBC: 3.19 x10E6/uL — ABNORMAL LOW (ref 4.14–5.80)
RDW: 17.1 % — ABNORMAL HIGH (ref 11.6–15.4)
WBC: 3.6 10*3/uL (ref 3.4–10.8)

## 2022-09-14 LAB — HEMOGLOBIN AND HEMATOCRIT, BLOOD
HCT: 29.8 % — ABNORMAL LOW (ref 39.0–52.0)
Hemoglobin: 9.2 g/dL — ABNORMAL LOW (ref 13.0–17.0)

## 2022-09-14 LAB — LIPID PANEL W/O CHOL/HDL RATIO
Cholesterol, Total: 102 mg/dL (ref 100–199)
HDL: 37 mg/dL — ABNORMAL LOW (ref >39)
LDL Chol Calc (NIH): 49 mg/dL (ref 0–99)
Triglycerides: 80 mg/dL (ref 0–149)
VLDL Cholesterol Cal: 16 mg/dL (ref 5–40)

## 2022-09-14 LAB — IMMATURE CELLS
MYELOCYTES: 1 % — ABNORMAL HIGH (ref 0–0)
Metamyelocytes: 7 % — ABNORMAL HIGH (ref 0–0)

## 2022-09-14 LAB — VITAMIN B12: Vitamin B-12: 883 pg/mL (ref 232–1245)

## 2022-09-14 LAB — TSH: TSH: 7.2 u[IU]/mL — ABNORMAL HIGH (ref 0.450–4.500)

## 2022-09-14 MED ORDER — EPOETIN ALFA-EPBX 10000 UNIT/ML IJ SOLN
10000.0000 [IU] | Freq: Once | INTRAMUSCULAR | Status: AC
  Administered 2022-09-14: 10000 [IU] via SUBCUTANEOUS
  Filled 2022-09-14: qty 1

## 2022-09-14 MED ORDER — EPOETIN ALFA-EPBX 40000 UNIT/ML IJ SOLN
40000.0000 [IU] | Freq: Once | INTRAMUSCULAR | Status: AC
  Administered 2022-09-14: 40000 [IU] via SUBCUTANEOUS
  Filled 2022-09-14: qty 1

## 2022-09-14 MED ORDER — EPOETIN ALFA-EPBX 20000 UNIT/ML IJ SOLN: 50000.0000 [IU] | Freq: Once | INTRAMUSCULAR | Status: DC

## 2022-09-20 ENCOUNTER — Other Ambulatory Visit: Payer: Self-pay | Admitting: Family Medicine

## 2022-09-20 MED ORDER — LEVOTHYROXINE SODIUM 25 MCG PO TABS: 25.0000 ug | ORAL_TABLET | Freq: Every day | ORAL | 2 refills | Status: DC

## 2022-09-21 ENCOUNTER — Inpatient Hospital Stay: Payer: Medicare Other

## 2022-09-21 VITALS — BP 143/67 | HR 63

## 2022-09-21 DIAGNOSIS — D7581 Myelofibrosis: Secondary | ICD-10-CM | POA: Diagnosis not present

## 2022-09-21 DIAGNOSIS — M9903 Segmental and somatic dysfunction of lumbar region: Secondary | ICD-10-CM | POA: Diagnosis not present

## 2022-09-21 DIAGNOSIS — M5412 Radiculopathy, cervical region: Secondary | ICD-10-CM | POA: Diagnosis not present

## 2022-09-21 DIAGNOSIS — D631 Anemia in chronic kidney disease: Secondary | ICD-10-CM

## 2022-09-21 DIAGNOSIS — M5033 Other cervical disc degeneration, cervicothoracic region: Secondary | ICD-10-CM | POA: Diagnosis not present

## 2022-09-21 DIAGNOSIS — N183 Chronic kidney disease, stage 3 unspecified: Secondary | ICD-10-CM | POA: Diagnosis not present

## 2022-09-21 DIAGNOSIS — D473 Essential (hemorrhagic) thrombocythemia: Secondary | ICD-10-CM | POA: Diagnosis not present

## 2022-09-21 DIAGNOSIS — M9901 Segmental and somatic dysfunction of cervical region: Secondary | ICD-10-CM | POA: Diagnosis not present

## 2022-09-21 LAB — HEMOGLOBIN AND HEMATOCRIT, BLOOD
HCT: 30.9 % — ABNORMAL LOW (ref 39.0–52.0)
Hemoglobin: 9.5 g/dL — ABNORMAL LOW (ref 13.0–17.0)

## 2022-09-21 MED ORDER — EPOETIN ALFA-EPBX 10000 UNIT/ML IJ SOLN
10000.0000 [IU] | Freq: Once | INTRAMUSCULAR | Status: AC
  Administered 2022-09-21: 10000 [IU] via SUBCUTANEOUS
  Filled 2022-09-21: qty 1

## 2022-09-21 MED ORDER — EPOETIN ALFA-EPBX 40000 UNIT/ML IJ SOLN
40000.0000 [IU] | Freq: Once | INTRAMUSCULAR | Status: AC
  Administered 2022-09-21: 40000 [IU] via SUBCUTANEOUS
  Filled 2022-09-21: qty 1

## 2022-09-21 MED ORDER — EPOETIN ALFA-EPBX 20000 UNIT/ML IJ SOLN: 50000.0000 [IU] | Freq: Once | INTRAMUSCULAR | Status: DC

## 2022-09-26 ENCOUNTER — Ambulatory Visit: Payer: Self-pay | Admitting: Family Medicine

## 2022-09-26 NOTE — Telephone Encounter (Signed)
  Chief Complaint: Pt returning a call from last week.   Unable to locate any new notes from anyone trying to call him.   He is aware of the thyroid message. Symptoms: N/A Frequency: N/A Pertinent Negatives: Patient denies N/A Disposition: ED /[] Urgent Care (no appt availability in office) / Appointment(In office/virtual)/  [facility]/ Home Care/ Refused Recommended Disposition /[] [place] [facility]/  Follow-up with PCP Additional Notes: Message sent to office in case someone there attempted to contact him but didn't put a note in the chart.     I let pt know we would call him back if we found out anything new.

## 2022-09-26 NOTE — Telephone Encounter (Signed)
Reason for Disposition  General information question, no triage required and triager able to answer question    Returning a call from last week but no notes found that he wasn't aware of already.    They called me last week and told me about my thyroid.  Answer Assessment - Initial Assessment Questions 1. REASON FOR CALL or QUESTION: "What is your reason for calling today?" or "How can I best help you?" or "What question do you have that I can help answer?"     Pt called in saying someone called him last week with a message.   He is aware of his appt being moved to 10/29/2022.   He and his wife are coming together that day.   No other notes from anyone else.   He got the message about his thyroid.  No other notes where anyone has tried to call him today or last week that he isn't already aware of.  Protocols used: Information Only Call - No Triage-A-AH

## 2022-09-28 ENCOUNTER — Inpatient Hospital Stay: Payer: Medicare Other

## 2022-09-28 DIAGNOSIS — N289 Disorder of kidney and ureter, unspecified: Secondary | ICD-10-CM

## 2022-09-28 DIAGNOSIS — D473 Essential (hemorrhagic) thrombocythemia: Secondary | ICD-10-CM | POA: Diagnosis not present

## 2022-09-28 DIAGNOSIS — N183 Chronic kidney disease, stage 3 unspecified: Secondary | ICD-10-CM

## 2022-09-28 DIAGNOSIS — D7581 Myelofibrosis: Secondary | ICD-10-CM | POA: Diagnosis not present

## 2022-09-28 DIAGNOSIS — D631 Anemia in chronic kidney disease: Secondary | ICD-10-CM | POA: Diagnosis not present

## 2022-09-28 LAB — HEMOGLOBIN AND HEMATOCRIT, BLOOD
HCT: 33.5 % — ABNORMAL LOW (ref 39.0–52.0)
Hemoglobin: 10.3 g/dL — ABNORMAL LOW (ref 13.0–17.0)

## 2022-09-28 NOTE — Progress Notes (Signed)
Hgb 10.3- retacrit held today. Pt aware.

## 2022-10-04 ENCOUNTER — Other Ambulatory Visit: Payer: Self-pay | Admitting: *Deleted

## 2022-10-04 DIAGNOSIS — N183 Chronic kidney disease, stage 3 unspecified: Secondary | ICD-10-CM

## 2022-10-04 DIAGNOSIS — D7581 Myelofibrosis: Secondary | ICD-10-CM

## 2022-10-04 DIAGNOSIS — D649 Anemia, unspecified: Secondary | ICD-10-CM

## 2022-10-05 ENCOUNTER — Inpatient Hospital Stay: Payer: Medicare Other

## 2022-10-05 ENCOUNTER — Inpatient Hospital Stay: Payer: Medicare Other | Attending: Oncology

## 2022-10-05 DIAGNOSIS — E611 Iron deficiency: Secondary | ICD-10-CM | POA: Diagnosis not present

## 2022-10-05 DIAGNOSIS — Z79899 Other long term (current) drug therapy: Secondary | ICD-10-CM

## 2022-10-05 DIAGNOSIS — N183 Chronic kidney disease, stage 3 unspecified: Secondary | ICD-10-CM

## 2022-10-05 DIAGNOSIS — D509 Iron deficiency anemia, unspecified: Secondary | ICD-10-CM | POA: Diagnosis not present

## 2022-10-05 DIAGNOSIS — D471 Chronic myeloproliferative disease: Secondary | ICD-10-CM | POA: Diagnosis not present

## 2022-10-05 DIAGNOSIS — N189 Chronic kidney disease, unspecified: Secondary | ICD-10-CM | POA: Diagnosis not present

## 2022-10-05 DIAGNOSIS — D631 Anemia in chronic kidney disease: Secondary | ICD-10-CM | POA: Diagnosis not present

## 2022-10-05 DIAGNOSIS — D7581 Myelofibrosis: Secondary | ICD-10-CM

## 2022-10-05 DIAGNOSIS — D649 Anemia, unspecified: Secondary | ICD-10-CM

## 2022-10-05 LAB — HEMOGLOBIN AND HEMATOCRIT (CANCER CENTER ONLY)
HCT: 34.2 % — ABNORMAL LOW (ref 39.0–52.0)
Hemoglobin: 10.5 g/dL — ABNORMAL LOW (ref 13.0–17.0)

## 2022-10-05 NOTE — Progress Notes (Signed)
Hgb at 10.5 today, will hold retacrit.

## 2022-10-12 ENCOUNTER — Inpatient Hospital Stay: Payer: Medicare Other

## 2022-10-12 VITALS — BP 122/60

## 2022-10-12 DIAGNOSIS — D631 Anemia in chronic kidney disease: Secondary | ICD-10-CM | POA: Diagnosis not present

## 2022-10-12 DIAGNOSIS — M5033 Other cervical disc degeneration, cervicothoracic region: Secondary | ICD-10-CM | POA: Diagnosis not present

## 2022-10-12 DIAGNOSIS — D7581 Myelofibrosis: Secondary | ICD-10-CM

## 2022-10-12 DIAGNOSIS — D509 Iron deficiency anemia, unspecified: Secondary | ICD-10-CM | POA: Diagnosis not present

## 2022-10-12 DIAGNOSIS — M9903 Segmental and somatic dysfunction of lumbar region: Secondary | ICD-10-CM | POA: Diagnosis not present

## 2022-10-12 DIAGNOSIS — M9901 Segmental and somatic dysfunction of cervical region: Secondary | ICD-10-CM | POA: Diagnosis not present

## 2022-10-12 DIAGNOSIS — D649 Anemia, unspecified: Secondary | ICD-10-CM

## 2022-10-12 DIAGNOSIS — N183 Chronic kidney disease, stage 3 unspecified: Secondary | ICD-10-CM

## 2022-10-12 DIAGNOSIS — N189 Chronic kidney disease, unspecified: Secondary | ICD-10-CM | POA: Diagnosis not present

## 2022-10-12 DIAGNOSIS — D471 Chronic myeloproliferative disease: Secondary | ICD-10-CM | POA: Diagnosis not present

## 2022-10-12 DIAGNOSIS — M5412 Radiculopathy, cervical region: Secondary | ICD-10-CM | POA: Diagnosis not present

## 2022-10-12 LAB — HEMOGLOBIN AND HEMATOCRIT (CANCER CENTER ONLY)
HCT: 32 % — ABNORMAL LOW (ref 39.0–52.0)
Hemoglobin: 9.9 g/dL — ABNORMAL LOW (ref 13.0–17.0)

## 2022-10-12 MED ORDER — EPOETIN ALFA-EPBX 20000 UNIT/ML IJ SOLN: 50000.0000 [IU] | Freq: Once | INTRAMUSCULAR | Status: DC

## 2022-10-12 MED ORDER — EPOETIN ALFA-EPBX 10000 UNIT/ML IJ SOLN
10000.0000 [IU] | Freq: Once | INTRAMUSCULAR | Status: AC
  Administered 2022-10-12: 10000 [IU] via SUBCUTANEOUS
  Filled 2022-10-12: qty 1

## 2022-10-12 MED ORDER — EPOETIN ALFA-EPBX 40000 UNIT/ML IJ SOLN
40000.0000 [IU] | Freq: Once | INTRAMUSCULAR | Status: AC
  Administered 2022-10-12: 40000 [IU] via SUBCUTANEOUS
  Filled 2022-10-12: qty 1

## 2022-10-18 ENCOUNTER — Ambulatory Visit: Payer: Medicare Other | Admitting: Family Medicine

## 2022-10-19 ENCOUNTER — Inpatient Hospital Stay: Payer: Medicare Other

## 2022-10-19 ENCOUNTER — Inpatient Hospital Stay (HOSPITAL_BASED_OUTPATIENT_CLINIC_OR_DEPARTMENT_OTHER): Payer: Medicare Other | Admitting: Oncology

## 2022-10-19 ENCOUNTER — Encounter: Payer: Self-pay | Admitting: Oncology

## 2022-10-19 ENCOUNTER — Inpatient Hospital Stay: Payer: Medicare Other | Admitting: Oncology

## 2022-10-19 ENCOUNTER — Telehealth: Payer: Self-pay | Admitting: Oncology

## 2022-10-19 ENCOUNTER — Other Ambulatory Visit: Payer: Self-pay | Admitting: *Deleted

## 2022-10-19 VITALS — BP 143/66 | HR 62 | Temp 96.5°F | Wt 182.2 lb

## 2022-10-19 DIAGNOSIS — D471 Chronic myeloproliferative disease: Secondary | ICD-10-CM | POA: Diagnosis not present

## 2022-10-19 DIAGNOSIS — I129 Hypertensive chronic kidney disease with stage 1 through stage 4 chronic kidney disease, or unspecified chronic kidney disease: Secondary | ICD-10-CM | POA: Diagnosis not present

## 2022-10-19 DIAGNOSIS — Z79899 Other long term (current) drug therapy: Secondary | ICD-10-CM | POA: Diagnosis not present

## 2022-10-19 DIAGNOSIS — D649 Anemia, unspecified: Secondary | ICD-10-CM | POA: Diagnosis not present

## 2022-10-19 DIAGNOSIS — R809 Proteinuria, unspecified: Secondary | ICD-10-CM | POA: Insufficient documentation

## 2022-10-19 DIAGNOSIS — D509 Iron deficiency anemia, unspecified: Secondary | ICD-10-CM

## 2022-10-19 DIAGNOSIS — E875 Hyperkalemia: Secondary | ICD-10-CM | POA: Diagnosis not present

## 2022-10-19 DIAGNOSIS — D631 Anemia in chronic kidney disease: Secondary | ICD-10-CM

## 2022-10-19 DIAGNOSIS — D7581 Myelofibrosis: Secondary | ICD-10-CM

## 2022-10-19 DIAGNOSIS — N189 Chronic kidney disease, unspecified: Secondary | ICD-10-CM | POA: Diagnosis not present

## 2022-10-19 DIAGNOSIS — N1832 Chronic kidney disease, stage 3b: Secondary | ICD-10-CM | POA: Diagnosis not present

## 2022-10-19 LAB — CBC WITH DIFFERENTIAL (CANCER CENTER ONLY)
Abs Immature Granulocytes: 0.53 10*3/uL — ABNORMAL HIGH (ref 0.00–0.07)
Basophils Absolute: 0.1 10*3/uL (ref 0.0–0.1)
Basophils Relative: 1 %
Eosinophils Absolute: 0 10*3/uL (ref 0.0–0.5)
Eosinophils Relative: 1 %
HCT: 30.4 % — ABNORMAL LOW (ref 39.0–52.0)
Hemoglobin: 9.4 g/dL — ABNORMAL LOW (ref 13.0–17.0)
Immature Granulocytes: 12 %
Lymphocytes Relative: 15 %
Lymphs Abs: 0.7 10*3/uL (ref 0.7–4.0)
MCH: 28.5 pg (ref 26.0–34.0)
MCHC: 30.9 g/dL (ref 30.0–36.0)
MCV: 92.1 fL (ref 80.0–100.0)
Monocytes Absolute: 0.7 10*3/uL (ref 0.1–1.0)
Monocytes Relative: 15 %
Neutro Abs: 2.6 10*3/uL (ref 1.7–7.7)
Neutrophils Relative %: 56 %
Platelet Count: 188 10*3/uL (ref 150–400)
RBC: 3.3 MIL/uL — ABNORMAL LOW (ref 4.22–5.81)
RDW: 18.4 % — ABNORMAL HIGH (ref 11.5–15.5)
WBC Count: 4.6 10*3/uL (ref 4.0–10.5)
nRBC: 1.3 % — ABNORMAL HIGH (ref 0.0–0.2)

## 2022-10-19 LAB — IRON AND TIBC
Iron: 30 ug/dL — ABNORMAL LOW (ref 45–182)
Saturation Ratios: 12 % — ABNORMAL LOW (ref 17.9–39.5)
TIBC: 256 ug/dL (ref 250–450)
UIBC: 226 ug/dL

## 2022-10-19 LAB — CMP (CANCER CENTER ONLY)
ALT: 21 U/L (ref 0–44)
AST: 24 U/L (ref 15–41)
Albumin: 3.7 g/dL (ref 3.5–5.0)
Alkaline Phosphatase: 72 U/L (ref 38–126)
Anion gap: 9 (ref 5–15)
BUN: 38 mg/dL — ABNORMAL HIGH (ref 8–23)
CO2: 20 mmol/L — ABNORMAL LOW (ref 22–32)
Calcium: 8.3 mg/dL — ABNORMAL LOW (ref 8.9–10.3)
Chloride: 108 mmol/L (ref 98–111)
Creatinine: 2.08 mg/dL — ABNORMAL HIGH (ref 0.61–1.24)
GFR, Estimated: 31 mL/min — ABNORMAL LOW (ref >60)
Glucose, Bld: 112 mg/dL — ABNORMAL HIGH (ref 70–99)
Potassium: 5.6 mmol/L — ABNORMAL HIGH (ref 3.5–5.1)
Sodium: 137 mmol/L (ref 135–145)
Total Bilirubin: 0.6 mg/dL (ref 0.3–1.2)
Total Protein: 6 g/dL — ABNORMAL LOW (ref 6.5–8.1)

## 2022-10-19 LAB — FERRITIN: Ferritin: 117 ng/mL (ref 24–336)

## 2022-10-19 MED ORDER — EPOETIN ALFA-EPBX 10000 UNIT/ML IJ SOLN
10000.0000 [IU] | Freq: Once | INTRAMUSCULAR | Status: AC
  Administered 2022-10-19: 10000 [IU] via SUBCUTANEOUS
  Filled 2022-10-19: qty 1

## 2022-10-19 MED ORDER — EPOETIN ALFA-EPBX 20000 UNIT/ML IJ SOLN: 50000.0000 [IU] | Freq: Once | INTRAMUSCULAR | Status: DC

## 2022-10-19 MED ORDER — EPOETIN ALFA-EPBX 40000 UNIT/ML IJ SOLN
40000.0000 [IU] | Freq: Once | INTRAMUSCULAR | Status: AC
  Administered 2022-10-19: 40000 [IU] via SUBCUTANEOUS
  Filled 2022-10-19: qty 1

## 2022-10-19 NOTE — Progress Notes (Signed)
Hematology/Oncology Consult note [facility]  [phone] [phone]  Patient Care Team: [name], DO as PCP - General (Family Medicine) Jolayne Panther, MD as Referring Physician (Hematology) Sherrie George, MD as Consulting Physician (Ophthalmology) Micki Riley, MD as Consulting Physician (Neurology) Jerilee Field, MD as Consulting Physician (Urology) Antonieta Iba, MD as Consulting Physician (Cardiology) Riki Altes, MD (Urology) Creig Hines, MD as Consulting Physician (Hematology and Oncology)   Name of the patient: [name]  [id number]  Apr 06, 1941   Date of visit: 10/19/22  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  At that time he was found to have a mild splenomegaly of 15.8 cm.DIPPS score is 35 (age 19- 1, hemoglobin less than 10- 2) and score of 4 if 1% circulating blasts included from 07/17/2014.   Bone marrow on 06/11/2014 was most consistent with primary myelofibrosis.  Bone marrow biopsy showed 1% abnormal cells: CD45+, CD5+, CD10, CD11c+/-, CD19+, CD2-+, (dim), CD22+ (dim, CD23+, CD38-/+, FMC7-, HLA-DR+, sig lambda+(dim).  Blasts were not increased 1.2%; hypercellular for age: 63%; JAK2 V617F mutation was negative.  CALR mutation positive.  Flow cytometry included about 1% CLL/SLL phenotype cells (CD5+) of uncertain significance and some infiltrate into the marrow with increased atypical megakaryocytes.  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  MDS FISH panel was negative.   Patient also follows up with [facility] benign hematology Dr. Ester Rink for his anemia.  Patient's hemoglobin was drifting down to the eights and was  started on EPO in October 2022.  Interval history-patient is tolerating Retacrit well without any significant side effects.  Appetite and weight as well as energy levels have remained stable.  ECOG PS- 1 Pain scale- 0   Review of systems- Review of Systems  Constitutional:  Negative for chills, fever, malaise/fatigue and weight loss.  HENT:  Negative for congestion, ear discharge and nosebleeds.   Eyes:  Negative for blurred vision.  Respiratory:  Negative for cough, hemoptysis, sputum production, shortness of breath and wheezing.   Cardiovascular:  Negative for chest pain, palpitations, orthopnea and claudication.  Gastrointestinal:  Negative for abdominal pain, blood in stool, constipation, diarrhea, heartburn, melena, nausea and vomiting.  Genitourinary:  Negative for dysuria, flank pain, frequency, hematuria and urgency.  Musculoskeletal:  Negative for back pain, joint pain and myalgias.  Skin:  Negative for rash.  Neurological:  Negative for dizziness, tingling, focal weakness, seizures, weakness and headaches.  Endo/Heme/Allergies:  Does not bruise/bleed easily.  Psychiatric/Behavioral:  Negative for depression and suicidal ideas. The patient does not have insomnia.       Allergies  Allergen Reactions   Meloxicam Nausea And Vomiting     Past Medical History:  Diagnosis Date   Arthritis    Benign hypertensive renal disease    GERD (gastroesophageal reflux disease)    Heartburn    History of retinal detachment    Hyperlipidemia    Hypertension    Melanoma (HCC)    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 approximately 10-15 years ago   Myelofibrosis [facility])    Prostate hypertrophy    Stroke [facility]) June 2009   R brain subcortical infarct     Past Surgical History:  Procedure Laterality Date   ASPIRATION /  INJECTION RENAL CYST  Feb 2017   BACK SURGERY     approx 20- 25 years ago   GAS INSERTION  08/11/2011   Procedure: INSERTION OF GAS;  Surgeon: Sherrie George, MD;   Location[facility] OR;  Service: Ophthalmology;  Laterality: Right;  [id number]   SCLERAL BUCKLE  08/11/2011   Procedure: SCLERAL BUCKLE;  Surgeon: Sherrie George, MD;  Location: [facility] OR;  Service: Ophthalmology;  Laterality: Right;   VARICOSE VEIN SURGERY      Social History   Socioeconomic History   Marital status: Married    Spouse name: [name]    Number of children: 2   Years of education: 12+   Highest education level: Some college, no degree  Occupational History   Occupation: Fish farm manager: OTHER    Comment: community   Occupation: SELF EMPLOYED    Employer: SELF EMPLOYED  Tobacco Use   Smoking status: Former    Packs/day: .25    Types: Cigarettes    Quit date: 06/06/2010    Years since quitting: 12.3   Smokeless tobacco: Never  Vaping Use   Vaping Use: Never used  Substance and Sexual Activity   Alcohol use: No    Alcohol/week: 0.0 standard drinks of alcohol   Drug use: No   Sexual activity: Not Currently  Other Topics Concern   Not on file  Social History Narrative   Pt lives at home with his family.   Caffeine Use- 2 cups daily   Patient has 2 children.    Patient has some college.    Patient is right handed.          Works full time   Chemical engineer Strain: Low Risk  (07/12/2021)   Overall Financial Resource Strain (CARDIA)    Difficulty of Paying Living Expenses: Not hard at all  Food Insecurity: No Food Insecurity (07/12/2021)   Hunger Vital Sign    Worried About Running Out of Food in the Last Year: Never true    Ran Out of Food in the Last Year: Never true  Transportation Needs: No Transportation Needs (07/12/2021)   PRAPARE - Administrator, Civil Service (Medical): No    Lack of Transportation (Non-Medical): No  Physical Activity: Inactive (07/12/2021)   Exercise Vital Sign    Days of Exercise per Week: 0 days    Minutes of Exercise per Session: 0 min  Stress: No Stress Concern Present (07/12/2021)   [employer] of Occupational Health - Occupational Stress Questionnaire    Feeling of Stress : Not at all  Social Connections: Socially Integrated (07/12/2021)   Social Connection and Isolation Panel [NHANES]    Frequency of Communication with Friends and Family: Three times a week    Frequency of Social Gatherings with Friends and Family: More than three times a week    Attends Religious Services: More than 4 times per year    Active Member of [employer] or Organizations: Yes    Attends Engineer, structural: More than 4 times per year    Marital Status: Married  Catering manager Violence: Not At Risk (07/12/2021)   Humiliation, Afraid, Rape, and Kick questionnaire    Fear of Current or Ex-Partner: No    Emotionally Abused: No    Physically Abused: No    Sexually Abused: No    Family History  Problem Relation Age of Onset   Heart disease Father  Diabetes Son    Kidney disease Neg Hx    Prostate cancer Neg Hx      Current Outpatient Medications:    albuterol (VENTOLIN HFA) 108 (90 Base) MCG/ACT inhaler, Inhale 2 puffs into the lungs every 6 (six) hours as needed for wheezing or shortness of breath., Disp: 8 g, Rfl: 12   amLODipine (NORVASC) 10 MG tablet, Take 1 tablet (10 mg total) by mouth every evening., Disp: 90 tablet, Rfl: 1   azelastine (ASTELIN) 0.1 % nasal spray, Place 1 spray into both nostrils 2 (two) times daily. Use in each nostril as directed, Disp: 30 mL, Rfl: 0   calcium carbonate (OSCAL) 1500 (600 Ca) MG TABS tablet, Take by mouth., Disp: , Rfl:    Cholecalciferol (D3-1000 PO), Take by mouth daily., Disp: , Rfl:    clopidogrel (PLAVIX) 75 MG tablet, Take 1 tablet (75 mg total) by mouth daily., Disp: 90 tablet, Rfl: 1   DULoxetine (CYMBALTA) 20 MG capsule, TAKE 1 CAPSULE BY MOUTH DAILY., Disp: 90 capsule, Rfl: 1   gabapentin (NEURONTIN) 400 MG capsule, Take 1 tab in the AM and 2 tabs at bedtime., Disp: 270 capsule, Rfl: 1   Ginsengs-Saw Palmetto (MULTI GINSENG & SAW  PALMETTO) 500 MG CAPS, Take 500 mg by mouth daily., Disp: , Rfl:    levothyroxine (SYNTHROID) 25 MCG tablet, Take 1 tablet (25 mcg total) by mouth daily., Disp: 30 tablet, Rfl: 2   losartan (COZAAR) 100 MG tablet, Take 1 tablet (100 mg total) by mouth every morning., Disp: 90 tablet, Rfl: 1   meclizine (ANTIVERT) 25 MG tablet, TAKE 1 TABLET BY MOUTH 3 TIMES DAILY AS NEEDED FOR DIZZINESS, Disp: 90 tablet, Rfl: 6   Multiple Vitamin (MULITIVITAMIN WITH MINERALS) TABS, Take 1 tablet by mouth daily., Disp: , Rfl:    Naproxen Sodium 220 MG CAPS, Take by mouth as needed., Disp: , Rfl:    omeprazole (PRILOSEC) 40 MG capsule, Take 1 capsule (40 mg total) by mouth daily., Disp: 90 capsule, Rfl: 1   patiromer (VELTASSA) 8.4 g packet, Take 1 packet (8.4 g total) by mouth daily., Disp: 5 each, Rfl: 0   simvastatin (ZOCOR) 40 MG tablet, Take 1 tablet (40 mg total) by mouth daily., Disp: 90 tablet, Rfl: 1   tadalafil (CIALIS) 5 MG tablet, Take 1 tablet (5 mg total) by mouth daily., Disp: 90 tablet, Rfl: 3   tamsulosin (FLOMAX) 0.4 MG CAPS capsule, Take 2 capsules (0.8 mg total) by mouth daily., Disp: 180 capsule, Rfl: 1   zinc gluconate 50 MG tablet, Take by mouth., Disp: , Rfl:  No current facility-administered medications for this visit.  Facility-Administered Medications Ordered in Other Visits:    epoetin alfa-epbx (RETACRIT) injection 10,000 Units, 10,000 Units, Subcutaneous, Once, Creig Hines, MD   epoetin alfa-epbx (RETACRIT) injection 40,000 Units, 40,000 Units, Subcutaneous, Once, Creig Hines, MD  Physical exam:  Vitals:   10/19/22 0950  BP: (!) 143/66  Pulse: 62  Temp: (!) 96.5 F (35.8 C)  TempSrc: Tympanic  SpO2: 99%  Weight: 182 lb 3.2 oz (82.6 kg)   Physical Exam Cardiovascular:     Rate and Rhythm: Normal rate and regular rhythm.     Heart sounds: Normal heart sounds.  Pulmonary:     Effort: Pulmonary effort is normal.     Breath sounds: Normal breath sounds.  Abdominal:      General: Bowel sounds are normal.     Palpations: Abdomen is soft.  Skin:  General: Skin is warm and dry.  Neurological:     Mental Status: He is alert and oriented to person, place, and time.         Latest Ref Rng & Units 10/19/2022    9:41 AM  CMP  Glucose 70 - 99 mg/dL 130   BUN 8 - 23 mg/dL 38   Creatinine 8.65 - 1.24 mg/dL 7.84   Sodium 696 - 295 mmol/L 137   Potassium 3.5 - 5.1 mmol/L 5.6   Chloride 98 - 111 mmol/L 108   CO2 22 - 32 mmol/L 20   Calcium 8.9 - 10.3 mg/dL 8.3   Total Protein 6.5 - 8.1 g/dL 6.0   Total Bilirubin 0.3 - 1.2 mg/dL 0.6   Alkaline Phos 38 - 126 U/L 72   AST 15 - 41 U/L 24   ALT 0 - 44 U/L 21       Latest Ref Rng & Units 10/19/2022    9:41 AM  CBC  WBC 4.0 - 10.5 K/uL 4.6   Hemoglobin 13.0 - 17.0 g/dL 9.4   Hematocrit 28.4 - 52.0 % 30.4   Platelets 150 - 400 K/uL 188      Assessment and plan- Patient is a 82 y.o. male with history of primary myelofibrosis predominantly anemia currently on Retacrit here for routine follow-up  Patient's hemoglobin has remained stable between 9-10 after starting Retacrit.  He presently gets 50,000 units of Retacrit weekly which she will continue.  Iron studies today showed a ferritinLevel of more than 100 but iron saturation is less than 20%.  Given that he has chronic kidney disease I think it would be reasonable to offer him a trial of IV iron as well at this time.  We will reach out to the patient since the iron studies came back after the patient left for the day.  Continue weekly Retacrit injections and I will see him back in 3 months.  Hyperkalemia: Chronic.  We will inform his primary care doctor about this.   Visit Diagnosis 1. Erythropoietin (EPO) stimulating agent anemia management patient   2. Hyperkalemia   3. Iron deficiency anemia, unspecified iron deficiency anemia type      Dr. Owens Shark, MD, MPH [facility] [phone] 10/19/2022 12:38 PM

## 2022-10-19 NOTE — Telephone Encounter (Signed)
VM left with patient. Per Dr. Rao---he can have Monoferric x 1 if he is agreeable. Confirmed no auth needed. Requested he call back to schedule.

## 2022-10-20 ENCOUNTER — Telehealth: Payer: Self-pay | Admitting: *Deleted

## 2022-10-20 ENCOUNTER — Other Ambulatory Visit: Payer: Self-pay | Admitting: Nephrology

## 2022-10-20 DIAGNOSIS — N1832 Chronic kidney disease, stage 3b: Secondary | ICD-10-CM

## 2022-10-20 DIAGNOSIS — I129 Hypertensive chronic kidney disease with stage 1 through stage 4 chronic kidney disease, or unspecified chronic kidney disease: Secondary | ICD-10-CM

## 2022-10-20 NOTE — Telephone Encounter (Signed)
I wanted the pt to know that his potassium level was 5.6. Dr Smith Robert wanted me to reach out to Dr. Thedore Mins. He sent me back to tell pt that he can come to there office and they will give him med to make potassium to go back down. I was able to get him on phone this evening and he was already going out of town as we were talking he was out of town. He can get the medication on Monday. He asked if when he takes the pills will it make the kidneys do better. I told him that would be something he would ask Singh. He is level 3 with kidneys and eating best and drinking water helps but Thedore Mins would be the one to say if things are going to get better or now.

## 2022-10-24 ENCOUNTER — Ambulatory Visit: Payer: Medicare Other | Admitting: Oncology

## 2022-10-24 ENCOUNTER — Inpatient Hospital Stay: Payer: Medicare Other

## 2022-10-26 ENCOUNTER — Inpatient Hospital Stay: Payer: Medicare Other

## 2022-10-26 VITALS — BP 130/66

## 2022-10-26 DIAGNOSIS — D7581 Myelofibrosis: Secondary | ICD-10-CM

## 2022-10-26 DIAGNOSIS — D509 Iron deficiency anemia, unspecified: Secondary | ICD-10-CM | POA: Diagnosis not present

## 2022-10-26 DIAGNOSIS — N189 Chronic kidney disease, unspecified: Secondary | ICD-10-CM | POA: Diagnosis not present

## 2022-10-26 DIAGNOSIS — D471 Chronic myeloproliferative disease: Secondary | ICD-10-CM | POA: Diagnosis not present

## 2022-10-26 DIAGNOSIS — D631 Anemia in chronic kidney disease: Secondary | ICD-10-CM

## 2022-10-26 LAB — HEMOGLOBIN AND HEMATOCRIT, BLOOD
HCT: 29.1 % — ABNORMAL LOW (ref 39.0–52.0)
Hemoglobin: 9.2 g/dL — ABNORMAL LOW (ref 13.0–17.0)

## 2022-10-26 MED ORDER — EPOETIN ALFA-EPBX 20000 UNIT/ML IJ SOLN: 50000.0000 [IU] | Freq: Once | INTRAMUSCULAR | Status: DC

## 2022-10-26 MED ORDER — EPOETIN ALFA-EPBX 10000 UNIT/ML IJ SOLN
10000.0000 [IU] | Freq: Once | INTRAMUSCULAR | Status: AC
  Administered 2022-10-26: 10000 [IU] via SUBCUTANEOUS
  Filled 2022-10-26: qty 1

## 2022-10-26 MED ORDER — EPOETIN ALFA-EPBX 40000 UNIT/ML IJ SOLN
40000.0000 [IU] | Freq: Once | INTRAMUSCULAR | Status: AC
  Administered 2022-10-26: 40000 [IU] via SUBCUTANEOUS
  Filled 2022-10-26: qty 1

## 2022-11-01 ENCOUNTER — Telehealth: Payer: Self-pay | Admitting: *Deleted

## 2022-11-01 ENCOUNTER — Ambulatory Visit: Payer: Medicare Other

## 2022-11-01 ENCOUNTER — Encounter: Payer: Self-pay | Admitting: Family Medicine

## 2022-11-01 ENCOUNTER — Ambulatory Visit (INDEPENDENT_AMBULATORY_CARE_PROVIDER_SITE_OTHER): Payer: Medicare Other | Admitting: Family Medicine

## 2022-11-01 VITALS — BP 126/64 | HR 60 | Temp 98.5°F | Wt 177.2 lb

## 2022-11-01 DIAGNOSIS — R252 Cramp and spasm: Secondary | ICD-10-CM | POA: Diagnosis not present

## 2022-11-01 DIAGNOSIS — E875 Hyperkalemia: Secondary | ICD-10-CM | POA: Diagnosis not present

## 2022-11-01 NOTE — Telephone Encounter (Signed)
Called to say that we have opening for IV iron on this thursday with time 1 pm May 30. I asked if he would call to let us know if the date is good for him

## 2022-11-01 NOTE — Progress Notes (Signed)
BP 126/64   Pulse 60   Temp 98.5 F (36.9 C)   Wt 177 lb 3.2 oz (80.4 kg)   SpO2 97%   BMI 26.94 kg/m    Subjective:    Patient ID: [name], male    DOB: 03/15/1941, 82 y.o.   MRN: [id number]  HPI: [name] is a 82 y.o. male  Chief Complaint  Patient presents with   Muscle Cramps   LEG CRAMPS Duration: months Pain: yes Severity: severe  Quality:  aching and sore Location:  lower legs Bilateral:  yes Onset: sudden Frequency:  every night Time of  day:   night time Sudden unintentional leg jerking:   no Paresthesias:   no Decreased sensation:  no Weakness:   no Insomnia:   yes Fatigue:   yes Status: stable Treatments attempted: higher dose of gabapentin helps his back but not the cramps  Relevant past medical, surgical, family and social history reviewed and updated as indicated. Interim medical history since our last visit reviewed. Allergies and medications reviewed and updated.  Review of Systems  Constitutional: Negative.   Respiratory: Negative.    Cardiovascular: Negative.   Gastrointestinal: Negative.   Musculoskeletal:  Positive for myalgias. Negative for arthralgias, back pain, gait problem, joint swelling, neck pain and neck stiffness.  Skin: Negative.   Neurological: Negative.   Psychiatric/Behavioral: Negative.      Per HPI unless specifically indicated above     Objective:    BP 126/64   Pulse 60   Temp 98.5 F (36.9 C)   Wt 177 lb 3.2 oz (80.4 kg)   SpO2 97%   BMI 26.94 kg/m   Wt Readings from Last 3 Encounters:  11/01/22 177 lb 3.2 oz (80.4 kg)  10/19/22 182 lb 3.2 oz (82.6 kg)  09/13/22 182 lb 3.2 oz (82.6 kg)    Physical Exam Vitals and nursing note reviewed.  Constitutional:      General: He is not in acute distress.    Appearance: Normal appearance. He is not ill-appearing, toxic-appearing or diaphoretic.  HENT:     Head: Normocephalic and atraumatic.     Right Ear: External ear normal.     Left Ear: External  ear normal.     Nose: Nose normal.     Mouth/Throat:     Mouth: Mucous membranes are moist.     Pharynx: Oropharynx is clear.  Eyes:     General: No scleral icterus.       Right eye: No discharge.        Left eye: No discharge.     Extraocular Movements: Extraocular movements intact.     Conjunctiva/sclera: Conjunctivae normal.     Pupils: Pupils are equal, round, and reactive to light.  Cardiovascular:     Rate and Rhythm: Normal rate and regular rhythm.     Pulses: Normal pulses.     Heart sounds: Normal heart sounds. No murmur heard.    No friction rub. No gallop.  Pulmonary:     Effort: Pulmonary effort is normal. No respiratory distress.     Breath sounds: Normal breath sounds. No stridor. No wheezing, rhonchi or rales.  Chest:     Chest wall: No tenderness.  Musculoskeletal:        General: Normal range of motion.     Cervical back: Normal range of motion and neck supple.  Skin:    General: Skin is warm and dry.     Capillary Refill: Capillary refill  takes less than 2 seconds.     Coloration: Skin is not jaundiced or pale.     Findings: No bruising, erythema, lesion or rash.  Neurological:     General: No focal deficit present.     Mental Status: He is alert and oriented to person, place, and time. Mental status is at baseline.  Psychiatric:        Mood and Affect: Mood normal.        Behavior: Behavior normal.        Thought Content: Thought content normal.        Judgment: Judgment normal.     Results for orders placed or performed in visit on 10/26/22  Hemoglobin and Hematocrit, Blood  Result Value Ref Range   Hemoglobin 9.2 (L) 13.0 - 17.0 g/dL   HCT 16.1 (L) 09.6 - 04.5 %      Assessment & Plan:   Problem List Items Addressed This Visit   None Visit Diagnoses     Hyperkalemia    -  Primary   Chronic. Rechecking labs today. Seeing nephro in 2 weeks, will defer treatment to them.   Relevant Orders   Basic metabolic panel   Muscle cramps       Likely  due to his CKD and hyperkalemia. Will check labs again today. Seeing nephro in 2 weeks, will defer treatment to them.        Follow up plan: Return October.

## 2022-11-02 ENCOUNTER — Other Ambulatory Visit: Payer: Self-pay | Admitting: Oncology

## 2022-11-02 ENCOUNTER — Inpatient Hospital Stay: Payer: Medicare Other

## 2022-11-02 VITALS — BP 141/68 | HR 60 | Resp 18

## 2022-11-02 DIAGNOSIS — N189 Chronic kidney disease, unspecified: Secondary | ICD-10-CM | POA: Diagnosis not present

## 2022-11-02 DIAGNOSIS — D509 Iron deficiency anemia, unspecified: Secondary | ICD-10-CM | POA: Diagnosis not present

## 2022-11-02 DIAGNOSIS — D7581 Myelofibrosis: Secondary | ICD-10-CM

## 2022-11-02 DIAGNOSIS — M5033 Other cervical disc degeneration, cervicothoracic region: Secondary | ICD-10-CM | POA: Diagnosis not present

## 2022-11-02 DIAGNOSIS — M9903 Segmental and somatic dysfunction of lumbar region: Secondary | ICD-10-CM | POA: Diagnosis not present

## 2022-11-02 DIAGNOSIS — D649 Anemia, unspecified: Secondary | ICD-10-CM

## 2022-11-02 DIAGNOSIS — D631 Anemia in chronic kidney disease: Secondary | ICD-10-CM

## 2022-11-02 DIAGNOSIS — N183 Chronic kidney disease, stage 3 unspecified: Secondary | ICD-10-CM

## 2022-11-02 DIAGNOSIS — M9901 Segmental and somatic dysfunction of cervical region: Secondary | ICD-10-CM | POA: Diagnosis not present

## 2022-11-02 DIAGNOSIS — D471 Chronic myeloproliferative disease: Secondary | ICD-10-CM | POA: Diagnosis not present

## 2022-11-02 DIAGNOSIS — M5412 Radiculopathy, cervical region: Secondary | ICD-10-CM | POA: Diagnosis not present

## 2022-11-02 LAB — BASIC METABOLIC PANEL
BUN/Creatinine Ratio: 17 (ref 10–24)
BUN: 37 mg/dL — ABNORMAL HIGH (ref 8–27)
CO2: 17 mmol/L — ABNORMAL LOW (ref 20–29)
Calcium: 8.4 mg/dL — ABNORMAL LOW (ref 8.6–10.2)
Chloride: 107 mmol/L — ABNORMAL HIGH (ref 96–106)
Creatinine, Ser: 2.17 mg/dL — ABNORMAL HIGH (ref 0.76–1.27)
Glucose: 95 mg/dL (ref 70–99)
Potassium: 4.9 mmol/L (ref 3.5–5.2)
Sodium: 141 mmol/L (ref 134–144)
eGFR: 30 mL/min/{1.73_m2} — ABNORMAL LOW (ref >59)

## 2022-11-02 LAB — HEMOGLOBIN AND HEMATOCRIT (CANCER CENTER ONLY)
HCT: 31.6 % — ABNORMAL LOW (ref 39.0–52.0)
Hemoglobin: 9.7 g/dL — ABNORMAL LOW (ref 13.0–17.0)

## 2022-11-02 MED ORDER — SODIUM CHLORIDE 0.9 % IV SOLN
1000.0000 mg | Freq: Once | INTRAVENOUS | Status: AC
  Administered 2022-11-02: 1000 mg via INTRAVENOUS
  Filled 2022-11-02: qty 10

## 2022-11-02 MED ORDER — SODIUM CHLORIDE 0.9 % IV SOLN
Freq: Once | INTRAVENOUS | Status: AC
  Filled 2022-11-02: qty 250

## 2022-11-02 NOTE — Patient Instructions (Signed)

## 2022-11-02 NOTE — Progress Notes (Signed)
Patient here for retacrit , due for Iron infusion tomorrow. Per patient would like to get iron today instead of injection. Patient states her will be leaving this afternoon for the beach. Per Infusion, patient can get iron as long as verified by insurance.

## 2022-11-03 ENCOUNTER — Inpatient Hospital Stay: Payer: Medicare Other

## 2022-11-03 ENCOUNTER — Encounter (INDEPENDENT_AMBULATORY_CARE_PROVIDER_SITE_OTHER): Payer: Medicare Other | Admitting: Ophthalmology

## 2022-11-09 ENCOUNTER — Inpatient Hospital Stay: Payer: Medicare Other

## 2022-11-09 ENCOUNTER — Inpatient Hospital Stay: Payer: Medicare Other | Attending: Oncology

## 2022-11-09 DIAGNOSIS — N189 Chronic kidney disease, unspecified: Secondary | ICD-10-CM | POA: Insufficient documentation

## 2022-11-09 DIAGNOSIS — D631 Anemia in chronic kidney disease: Secondary | ICD-10-CM | POA: Insufficient documentation

## 2022-11-09 DIAGNOSIS — D7581 Myelofibrosis: Secondary | ICD-10-CM

## 2022-11-09 DIAGNOSIS — D649 Anemia, unspecified: Secondary | ICD-10-CM

## 2022-11-09 DIAGNOSIS — D471 Chronic myeloproliferative disease: Secondary | ICD-10-CM | POA: Diagnosis not present

## 2022-11-09 DIAGNOSIS — N183 Chronic kidney disease, stage 3 unspecified: Secondary | ICD-10-CM

## 2022-11-09 LAB — HEMOGLOBIN AND HEMATOCRIT (CANCER CENTER ONLY)
HCT: 32.4 % — ABNORMAL LOW (ref 39.0–52.0)
Hemoglobin: 10 g/dL — ABNORMAL LOW (ref 13.0–17.0)

## 2022-11-09 NOTE — Progress Notes (Signed)
Hgb is 10 today we will hold retacrit

## 2022-11-16 ENCOUNTER — Inpatient Hospital Stay: Payer: Medicare Other

## 2022-11-16 VITALS — BP 137/62

## 2022-11-16 DIAGNOSIS — D7581 Myelofibrosis: Secondary | ICD-10-CM

## 2022-11-16 DIAGNOSIS — D631 Anemia in chronic kidney disease: Secondary | ICD-10-CM | POA: Diagnosis not present

## 2022-11-16 DIAGNOSIS — N183 Chronic kidney disease, stage 3 unspecified: Secondary | ICD-10-CM

## 2022-11-16 DIAGNOSIS — N1832 Chronic kidney disease, stage 3b: Secondary | ICD-10-CM | POA: Diagnosis not present

## 2022-11-16 DIAGNOSIS — R809 Proteinuria, unspecified: Secondary | ICD-10-CM | POA: Diagnosis not present

## 2022-11-16 DIAGNOSIS — E875 Hyperkalemia: Secondary | ICD-10-CM | POA: Diagnosis not present

## 2022-11-16 DIAGNOSIS — N189 Chronic kidney disease, unspecified: Secondary | ICD-10-CM | POA: Diagnosis not present

## 2022-11-16 DIAGNOSIS — D649 Anemia, unspecified: Secondary | ICD-10-CM

## 2022-11-16 DIAGNOSIS — I129 Hypertensive chronic kidney disease with stage 1 through stage 4 chronic kidney disease, or unspecified chronic kidney disease: Secondary | ICD-10-CM | POA: Diagnosis not present

## 2022-11-16 DIAGNOSIS — D471 Chronic myeloproliferative disease: Secondary | ICD-10-CM | POA: Diagnosis not present

## 2022-11-16 LAB — HEMOGLOBIN AND HEMATOCRIT (CANCER CENTER ONLY)
HCT: 27.9 % — ABNORMAL LOW (ref 39.0–52.0)
Hemoglobin: 8.8 g/dL — ABNORMAL LOW (ref 13.0–17.0)

## 2022-11-16 MED ORDER — EPOETIN ALFA-EPBX 10000 UNIT/ML IJ SOLN
10000.0000 [IU] | Freq: Once | INTRAMUSCULAR | Status: AC
  Administered 2022-11-16: 10000 [IU] via SUBCUTANEOUS
  Filled 2022-11-16: qty 1

## 2022-11-16 MED ORDER — EPOETIN ALFA-EPBX 20000 UNIT/ML IJ SOLN: 50000.0000 [IU] | Freq: Once | INTRAMUSCULAR | Status: DC

## 2022-11-16 MED ORDER — EPOETIN ALFA-EPBX 40000 UNIT/ML IJ SOLN
40000.0000 [IU] | Freq: Once | INTRAMUSCULAR | Status: AC
  Administered 2022-11-16: 40000 [IU] via SUBCUTANEOUS
  Filled 2022-11-16: qty 1

## 2022-11-18 ENCOUNTER — Ambulatory Visit (INDEPENDENT_AMBULATORY_CARE_PROVIDER_SITE_OTHER): Payer: Medicare Other | Admitting: Family Medicine

## 2022-11-18 ENCOUNTER — Encounter: Payer: Self-pay | Admitting: Family Medicine

## 2022-11-18 VITALS — BP 133/66 | HR 79 | Temp 98.5°F | Ht 68.0 in | Wt 179.2 lb

## 2022-11-18 DIAGNOSIS — J069 Acute upper respiratory infection, unspecified: Secondary | ICD-10-CM

## 2022-11-18 MED ORDER — PREDNISONE 50 MG PO TABS: 50.0000 mg | ORAL_TABLET | Freq: Every day | ORAL | 0 refills | Status: DC

## 2022-11-18 NOTE — Progress Notes (Signed)
BP 133/66   Pulse 79   Temp 98.5 F (36.9 C) (Oral)   Ht 5\' 8"  (1.727 m)   Wt 179 lb 3.2 oz (81.3 kg)   SpO2 96%   BMI 27.25 kg/m    Subjective:    Patient ID: [name], male    DOB: 1940-11-14, 82 y.o.   MRN: [id number]  HPI: SIDAK GERE is a 82 y.o. male  Chief Complaint  Patient presents with   Nasal Congestion    Patient says he has been having issues with chest congestion, nasal drainage, and says that he has not been exposed to anyone sick recently. Patient says he has been taking Mucinex OTC medication. Patient says about 4-5 days ago, it started with his throat feeling scratchy and he has no energy.    Cough   UPPER RESPIRATORY TRACT INFECTION Duration: 4-5 days Worst symptom: congestion Fever: no Cough: yes Shortness of breath: yes Wheezing: no Chest pain: no Chest tightness: yes Chest congestion: yes Nasal congestion: yes Runny nose: yes Post nasal drip: yes Sneezing: no Sore throat: yes Swollen glands: no Sinus pressure: no Headache: no Face pain: no Toothache: no Ear pain: no  Ear pressure: no  Eyes red/itching:no Eye drainage/crusting: no  Vomiting: no Rash: no Fatigue: yes Sick contacts: no Strep contacts: no  Context: stable Recurrent sinusitis: no Relief with OTC cold/cough medications: no  Treatments attempted: cold/sinus, mucinex, anti-histamine, and pseudoephedrine    Relevant past medical, surgical, family and social history reviewed and updated as indicated. Interim medical history since our last visit reviewed. Allergies and medications reviewed and updated.  Review of Systems  Constitutional:  Positive for fatigue. Negative for activity change, appetite change, chills, diaphoresis, fever and unexpected weight change.  HENT:  Positive for congestion, postnasal drip, rhinorrhea and sore throat. Negative for dental problem, drooling, ear discharge, ear pain, facial swelling, hearing loss, mouth sores, nosebleeds, sinus  pressure, sinus pain, sneezing, tinnitus, trouble swallowing and voice change.   Eyes: Negative.   Respiratory:  Positive for cough, chest tightness and shortness of breath. Negative for apnea, choking, wheezing and stridor.   Cardiovascular: Negative.   Gastrointestinal: Negative.   Neurological: Negative.   Psychiatric/Behavioral: Negative.      Per HPI unless specifically indicated above     Objective:    BP 133/66   Pulse 79   Temp 98.5 F (36.9 C) (Oral)   Ht 5\' 8"  (1.727 m)   Wt 179 lb 3.2 oz (81.3 kg)   SpO2 96%   BMI 27.25 kg/m   Wt Readings from Last 3 Encounters:  11/18/22 179 lb 3.2 oz (81.3 kg)  11/01/22 177 lb 3.2 oz (80.4 kg)  10/19/22 182 lb 3.2 oz (82.6 kg)    Physical Exam Vitals and nursing note reviewed.  Constitutional:      General: He is not in acute distress.    Appearance: Normal appearance. He is not ill-appearing, toxic-appearing or diaphoretic.  HENT:     Head: Normocephalic and atraumatic.     Right Ear: Tympanic membrane, ear canal and external ear normal.     Left Ear: Tympanic membrane, ear canal and external ear normal.     Nose: Rhinorrhea present. No congestion.     Mouth/Throat:     Mouth: Mucous membranes are moist.     Pharynx: Oropharynx is clear. No oropharyngeal exudate or posterior oropharyngeal erythema.  Eyes:     General: No scleral icterus.  Right eye: No discharge.        Left eye: No discharge.     Extraocular Movements: Extraocular movements intact.     Conjunctiva/sclera: Conjunctivae normal.     Pupils: Pupils are equal, round, and reactive to light.  Cardiovascular:     Rate and Rhythm: Normal rate and regular rhythm.     Pulses: Normal pulses.     Heart sounds: Normal heart sounds. No murmur heard.    No friction rub. No gallop.  Pulmonary:     Effort: Pulmonary effort is normal. No respiratory distress.     Breath sounds: Normal breath sounds. No stridor. No wheezing, rhonchi or rales.  Chest:     Chest  wall: No tenderness.  Musculoskeletal:        General: Normal range of motion.     Cervical back: Normal range of motion and neck supple.  Skin:    General: Skin is warm and dry.     Capillary Refill: Capillary refill takes less than 2 seconds.     Coloration: Skin is not jaundiced or pale.     Findings: No bruising, erythema, lesion or rash.  Neurological:     General: No focal deficit present.     Mental Status: He is alert and oriented to person, place, and time. Mental status is at baseline.  Psychiatric:        Mood and Affect: Mood normal.        Behavior: Behavior normal.        Thought Content: Thought content normal.        Judgment: Judgment normal.     Results for orders placed or performed in visit on 11/16/22  Hemoglobin and Hematocrit (Cancer Center Only)  Result Value Ref Range   Hemoglobin 8.8 (L) 13.0 - 17.0 g/dL   HCT 16.1 (L) 09.6 - 04.5 %      Assessment & Plan:   Problem List Items Addressed This Visit   None Visit Diagnoses     Upper respiratory tract infection, unspecified type    -  Primary   Will treat with burst of prednisone and test for COVID. Call with any concerns or if not getting better. Continue to monitor.   Relevant Orders   Novel Coronavirus, NAA ([facility])        Follow up plan: Return if symptoms worsen or fail to improve.

## 2022-11-22 LAB — NOVEL CORONAVIRUS, NAA: SARS-CoV-2, NAA: NOT DETECTED

## 2022-11-23 ENCOUNTER — Inpatient Hospital Stay: Payer: Medicare Other

## 2022-11-23 VITALS — BP 121/55 | HR 62

## 2022-11-23 DIAGNOSIS — M9903 Segmental and somatic dysfunction of lumbar region: Secondary | ICD-10-CM | POA: Diagnosis not present

## 2022-11-23 DIAGNOSIS — M9901 Segmental and somatic dysfunction of cervical region: Secondary | ICD-10-CM | POA: Diagnosis not present

## 2022-11-23 DIAGNOSIS — N183 Chronic kidney disease, stage 3 unspecified: Secondary | ICD-10-CM

## 2022-11-23 DIAGNOSIS — D7581 Myelofibrosis: Secondary | ICD-10-CM

## 2022-11-23 DIAGNOSIS — M5033 Other cervical disc degeneration, cervicothoracic region: Secondary | ICD-10-CM | POA: Diagnosis not present

## 2022-11-23 DIAGNOSIS — D631 Anemia in chronic kidney disease: Secondary | ICD-10-CM

## 2022-11-23 DIAGNOSIS — N401 Enlarged prostate with lower urinary tract symptoms: Secondary | ICD-10-CM | POA: Diagnosis not present

## 2022-11-23 DIAGNOSIS — N189 Chronic kidney disease, unspecified: Secondary | ICD-10-CM | POA: Diagnosis not present

## 2022-11-23 DIAGNOSIS — N1832 Chronic kidney disease, stage 3b: Secondary | ICD-10-CM | POA: Diagnosis not present

## 2022-11-23 DIAGNOSIS — R809 Proteinuria, unspecified: Secondary | ICD-10-CM | POA: Diagnosis not present

## 2022-11-23 DIAGNOSIS — D471 Chronic myeloproliferative disease: Secondary | ICD-10-CM | POA: Diagnosis not present

## 2022-11-23 DIAGNOSIS — N041 Nephrotic syndrome with focal and segmental glomerular lesions: Secondary | ICD-10-CM | POA: Diagnosis not present

## 2022-11-23 DIAGNOSIS — Z79899 Other long term (current) drug therapy: Secondary | ICD-10-CM

## 2022-11-23 DIAGNOSIS — D649 Anemia, unspecified: Secondary | ICD-10-CM

## 2022-11-23 DIAGNOSIS — M5412 Radiculopathy, cervical region: Secondary | ICD-10-CM | POA: Diagnosis not present

## 2022-11-23 LAB — HEMOGLOBIN AND HEMATOCRIT (CANCER CENTER ONLY)
HCT: 27.9 % — ABNORMAL LOW (ref 39.0–52.0)
Hemoglobin: 8.7 g/dL — ABNORMAL LOW (ref 13.0–17.0)

## 2022-11-23 MED ORDER — EPOETIN ALFA-EPBX 10000 UNIT/ML IJ SOLN
10000.0000 [IU] | Freq: Once | INTRAMUSCULAR | Status: AC
  Administered 2022-11-23: 10000 [IU] via SUBCUTANEOUS
  Filled 2022-11-23: qty 1

## 2022-11-23 MED ORDER — EPOETIN ALFA-EPBX 20000 UNIT/ML IJ SOLN: 50000.0000 [IU] | Freq: Once | INTRAMUSCULAR | Status: DC

## 2022-11-23 MED ORDER — EPOETIN ALFA-EPBX 40000 UNIT/ML IJ SOLN
40000.0000 [IU] | Freq: Once | INTRAMUSCULAR | Status: AC
  Administered 2022-11-23: 40000 [IU] via SUBCUTANEOUS
  Filled 2022-11-23: qty 1

## 2022-11-24 ENCOUNTER — Ambulatory Visit: Payer: Medicare Other | Admitting: Dermatology

## 2022-11-30 ENCOUNTER — Inpatient Hospital Stay: Payer: Medicare Other

## 2022-11-30 VITALS — BP 133/69 | Wt 174.0 lb

## 2022-11-30 DIAGNOSIS — D7581 Myelofibrosis: Secondary | ICD-10-CM

## 2022-11-30 DIAGNOSIS — D631 Anemia in chronic kidney disease: Secondary | ICD-10-CM

## 2022-11-30 DIAGNOSIS — D649 Anemia, unspecified: Secondary | ICD-10-CM

## 2022-11-30 DIAGNOSIS — N189 Chronic kidney disease, unspecified: Secondary | ICD-10-CM | POA: Diagnosis not present

## 2022-11-30 DIAGNOSIS — D471 Chronic myeloproliferative disease: Secondary | ICD-10-CM | POA: Diagnosis not present

## 2022-11-30 LAB — HEMOGLOBIN AND HEMATOCRIT (CANCER CENTER ONLY)
HCT: 30 % — ABNORMAL LOW (ref 39.0–52.0)
Hemoglobin: 9.3 g/dL — ABNORMAL LOW (ref 13.0–17.0)

## 2022-11-30 MED ORDER — EPOETIN ALFA-EPBX 20000 UNIT/ML IJ SOLN: 50000.0000 [IU] | Freq: Once | INTRAMUSCULAR | Status: DC

## 2022-11-30 MED ORDER — EPOETIN ALFA-EPBX 10000 UNIT/ML IJ SOLN
10000.0000 [IU] | Freq: Once | INTRAMUSCULAR | Status: AC
  Administered 2022-11-30: 10000 [IU] via SUBCUTANEOUS
  Filled 2022-11-30: qty 1

## 2022-11-30 MED ORDER — EPOETIN ALFA-EPBX 40000 UNIT/ML IJ SOLN
40000.0000 [IU] | Freq: Once | INTRAMUSCULAR | Status: AC
  Administered 2022-11-30: 40000 [IU] via SUBCUTANEOUS
  Filled 2022-11-30: qty 1

## 2022-12-05 ENCOUNTER — Ambulatory Visit
Admission: RE | Admit: 2022-12-05 | Discharge: 2022-12-05 | Disposition: A | Discharge status: Home / Self Care | Payer: Medicare Other | Source: Ambulatory Visit | Attending: Nephrology | Admitting: Nephrology

## 2022-12-05 DIAGNOSIS — I129 Hypertensive chronic kidney disease with stage 1 through stage 4 chronic kidney disease, or unspecified chronic kidney disease: Secondary | ICD-10-CM | POA: Insufficient documentation

## 2022-12-05 DIAGNOSIS — N281 Cyst of kidney, acquired: Secondary | ICD-10-CM | POA: Diagnosis not present

## 2022-12-05 DIAGNOSIS — N1832 Chronic kidney disease, stage 3b: Secondary | ICD-10-CM | POA: Insufficient documentation

## 2022-12-07 ENCOUNTER — Ambulatory Visit: Payer: Medicare Other

## 2022-12-07 ENCOUNTER — Other Ambulatory Visit: Payer: Medicare Other

## 2022-12-07 ENCOUNTER — Inpatient Hospital Stay: Payer: Medicare Other | Attending: Oncology

## 2022-12-07 ENCOUNTER — Inpatient Hospital Stay: Payer: Medicare Other

## 2022-12-07 VITALS — BP 128/67

## 2022-12-07 DIAGNOSIS — N1832 Chronic kidney disease, stage 3b: Secondary | ICD-10-CM | POA: Diagnosis not present

## 2022-12-07 DIAGNOSIS — D631 Anemia in chronic kidney disease: Secondary | ICD-10-CM | POA: Insufficient documentation

## 2022-12-07 DIAGNOSIS — D471 Chronic myeloproliferative disease: Secondary | ICD-10-CM | POA: Insufficient documentation

## 2022-12-07 DIAGNOSIS — I129 Hypertensive chronic kidney disease with stage 1 through stage 4 chronic kidney disease, or unspecified chronic kidney disease: Secondary | ICD-10-CM | POA: Insufficient documentation

## 2022-12-07 DIAGNOSIS — D7581 Myelofibrosis: Secondary | ICD-10-CM

## 2022-12-07 DIAGNOSIS — D649 Anemia, unspecified: Secondary | ICD-10-CM

## 2022-12-07 LAB — HEMOGLOBIN AND HEMATOCRIT (CANCER CENTER ONLY)
HCT: 30.5 % — ABNORMAL LOW (ref 39.0–52.0)
Hemoglobin: 9.4 g/dL — ABNORMAL LOW (ref 13.0–17.0)

## 2022-12-07 MED ORDER — EPOETIN ALFA-EPBX 10000 UNIT/ML IJ SOLN
10000.0000 [IU] | Freq: Once | INTRAMUSCULAR | Status: AC
  Administered 2022-12-07: 10000 [IU] via SUBCUTANEOUS
  Filled 2022-12-07: qty 1

## 2022-12-07 MED ORDER — EPOETIN ALFA-EPBX 40000 UNIT/ML IJ SOLN
40000.0000 [IU] | Freq: Once | INTRAMUSCULAR | Status: AC
  Administered 2022-12-07: 40000 [IU] via SUBCUTANEOUS
  Filled 2022-12-07: qty 1

## 2022-12-07 MED ORDER — EPOETIN ALFA-EPBX 20000 UNIT/ML IJ SOLN: 50000.0000 [IU] | Freq: Once | INTRAMUSCULAR | Status: DC

## 2022-12-14 ENCOUNTER — Inpatient Hospital Stay: Payer: Medicare Other

## 2022-12-14 VITALS — BP 126/70 | HR 70

## 2022-12-14 DIAGNOSIS — D631 Anemia in chronic kidney disease: Secondary | ICD-10-CM

## 2022-12-14 DIAGNOSIS — M5033 Other cervical disc degeneration, cervicothoracic region: Secondary | ICD-10-CM | POA: Diagnosis not present

## 2022-12-14 DIAGNOSIS — D7581 Myelofibrosis: Secondary | ICD-10-CM

## 2022-12-14 DIAGNOSIS — M9903 Segmental and somatic dysfunction of lumbar region: Secondary | ICD-10-CM | POA: Diagnosis not present

## 2022-12-14 DIAGNOSIS — D649 Anemia, unspecified: Secondary | ICD-10-CM

## 2022-12-14 DIAGNOSIS — M5412 Radiculopathy, cervical region: Secondary | ICD-10-CM | POA: Diagnosis not present

## 2022-12-14 DIAGNOSIS — N1832 Chronic kidney disease, stage 3b: Secondary | ICD-10-CM | POA: Diagnosis not present

## 2022-12-14 DIAGNOSIS — D471 Chronic myeloproliferative disease: Secondary | ICD-10-CM | POA: Diagnosis not present

## 2022-12-14 DIAGNOSIS — M9901 Segmental and somatic dysfunction of cervical region: Secondary | ICD-10-CM | POA: Diagnosis not present

## 2022-12-14 DIAGNOSIS — I129 Hypertensive chronic kidney disease with stage 1 through stage 4 chronic kidney disease, or unspecified chronic kidney disease: Secondary | ICD-10-CM | POA: Diagnosis not present

## 2022-12-14 LAB — HEMOGLOBIN AND HEMATOCRIT (CANCER CENTER ONLY)
HCT: 32.3 % — ABNORMAL LOW (ref 39.0–52.0)
Hemoglobin: 9.9 g/dL — ABNORMAL LOW (ref 13.0–17.0)

## 2022-12-14 MED ORDER — EPOETIN ALFA-EPBX 20000 UNIT/ML IJ SOLN: 50000.0000 [IU] | Freq: Once | INTRAMUSCULAR | Status: DC

## 2022-12-14 MED ORDER — EPOETIN ALFA-EPBX 10000 UNIT/ML IJ SOLN
10000.0000 [IU] | Freq: Once | INTRAMUSCULAR | Status: AC
  Administered 2022-12-14: 10000 [IU] via SUBCUTANEOUS

## 2022-12-14 MED ORDER — EPOETIN ALFA-EPBX 40000 UNIT/ML IJ SOLN
40000.0000 [IU] | Freq: Once | INTRAMUSCULAR | Status: AC
  Administered 2022-12-14: 40000 [IU] via SUBCUTANEOUS

## 2022-12-21 ENCOUNTER — Inpatient Hospital Stay: Payer: Medicare Other

## 2022-12-21 DIAGNOSIS — D631 Anemia in chronic kidney disease: Secondary | ICD-10-CM | POA: Diagnosis not present

## 2022-12-21 DIAGNOSIS — D7581 Myelofibrosis: Secondary | ICD-10-CM

## 2022-12-21 DIAGNOSIS — N1832 Chronic kidney disease, stage 3b: Secondary | ICD-10-CM | POA: Diagnosis not present

## 2022-12-21 DIAGNOSIS — D471 Chronic myeloproliferative disease: Secondary | ICD-10-CM | POA: Diagnosis not present

## 2022-12-21 DIAGNOSIS — N183 Chronic kidney disease, stage 3 unspecified: Secondary | ICD-10-CM

## 2022-12-21 DIAGNOSIS — D649 Anemia, unspecified: Secondary | ICD-10-CM

## 2022-12-21 DIAGNOSIS — I129 Hypertensive chronic kidney disease with stage 1 through stage 4 chronic kidney disease, or unspecified chronic kidney disease: Secondary | ICD-10-CM | POA: Diagnosis not present

## 2022-12-21 LAB — HEMOGLOBIN AND HEMATOCRIT (CANCER CENTER ONLY)
HCT: 33.7 % — ABNORMAL LOW (ref 39.0–52.0)
Hemoglobin: 10.4 g/dL — ABNORMAL LOW (ref 13.0–17.0)

## 2022-12-21 NOTE — Progress Notes (Signed)
Hgb is at 10.4 today; hold retacrit

## 2022-12-27 DIAGNOSIS — I129 Hypertensive chronic kidney disease with stage 1 through stage 4 chronic kidney disease, or unspecified chronic kidney disease: Secondary | ICD-10-CM | POA: Diagnosis not present

## 2022-12-27 DIAGNOSIS — D631 Anemia in chronic kidney disease: Secondary | ICD-10-CM | POA: Diagnosis not present

## 2022-12-27 DIAGNOSIS — Z961 Presence of intraocular lens: Secondary | ICD-10-CM | POA: Diagnosis not present

## 2022-12-27 DIAGNOSIS — R809 Proteinuria, unspecified: Secondary | ICD-10-CM | POA: Diagnosis not present

## 2022-12-27 DIAGNOSIS — N1832 Chronic kidney disease, stage 3b: Secondary | ICD-10-CM | POA: Diagnosis not present

## 2022-12-27 DIAGNOSIS — N281 Cyst of kidney, acquired: Secondary | ICD-10-CM | POA: Diagnosis not present

## 2022-12-27 DIAGNOSIS — E875 Hyperkalemia: Secondary | ICD-10-CM | POA: Diagnosis not present

## 2022-12-28 ENCOUNTER — Inpatient Hospital Stay: Payer: Medicare Other

## 2022-12-28 DIAGNOSIS — D631 Anemia in chronic kidney disease: Secondary | ICD-10-CM | POA: Diagnosis not present

## 2022-12-28 DIAGNOSIS — I129 Hypertensive chronic kidney disease with stage 1 through stage 4 chronic kidney disease, or unspecified chronic kidney disease: Secondary | ICD-10-CM | POA: Diagnosis not present

## 2022-12-28 DIAGNOSIS — D471 Chronic myeloproliferative disease: Secondary | ICD-10-CM | POA: Diagnosis not present

## 2022-12-28 DIAGNOSIS — D7581 Myelofibrosis: Secondary | ICD-10-CM

## 2022-12-28 DIAGNOSIS — N183 Chronic kidney disease, stage 3 unspecified: Secondary | ICD-10-CM

## 2022-12-28 DIAGNOSIS — N1832 Chronic kidney disease, stage 3b: Secondary | ICD-10-CM | POA: Diagnosis not present

## 2022-12-28 DIAGNOSIS — D649 Anemia, unspecified: Secondary | ICD-10-CM

## 2022-12-28 LAB — CBC WITH DIFFERENTIAL/PLATELET
Abs Immature Granulocytes: 0.43 10*3/uL — ABNORMAL HIGH (ref 0.00–0.07)
Basophils Absolute: 0 10*3/uL (ref 0.0–0.1)
Basophils Relative: 1 %
Eosinophils Absolute: 0 10*3/uL (ref 0.0–0.5)
Eosinophils Relative: 0 %
HCT: 34.7 % — ABNORMAL LOW (ref 39.0–52.0)
Hemoglobin: 10.4 g/dL — ABNORMAL LOW (ref 13.0–17.0)
Immature Granulocytes: 11 %
Lymphocytes Relative: 16 %
Lymphs Abs: 0.6 10*3/uL — ABNORMAL LOW (ref 0.7–4.0)
MCH: 29.1 pg (ref 26.0–34.0)
MCHC: 30 g/dL (ref 30.0–36.0)
MCV: 96.9 fL (ref 80.0–100.0)
Monocytes Absolute: 0.6 10*3/uL (ref 0.1–1.0)
Monocytes Relative: 15 %
Neutro Abs: 2.3 10*3/uL (ref 1.7–7.7)
Neutrophils Relative %: 57 %
Platelets: 188 10*3/uL (ref 150–400)
RBC: 3.58 MIL/uL — ABNORMAL LOW (ref 4.22–5.81)
RDW: 18.9 % — ABNORMAL HIGH (ref 11.5–15.5)
Smear Review: NORMAL
WBC: 4 10*3/uL (ref 4.0–10.5)
nRBC: 0.8 % — ABNORMAL HIGH (ref 0.0–0.2)

## 2022-12-28 LAB — COMPREHENSIVE METABOLIC PANEL
ALT: 24 U/L (ref 0–44)
AST: 22 U/L (ref 15–41)
Albumin: 3.8 g/dL (ref 3.5–5.0)
Alkaline Phosphatase: 77 U/L (ref 38–126)
Anion gap: 5 (ref 5–15)
BUN: 44 mg/dL — ABNORMAL HIGH (ref 8–23)
CO2: 20 mmol/L — ABNORMAL LOW (ref 22–32)
Calcium: 8.2 mg/dL — ABNORMAL LOW (ref 8.9–10.3)
Chloride: 113 mmol/L — ABNORMAL HIGH (ref 98–111)
Creatinine, Ser: 1.91 mg/dL — ABNORMAL HIGH (ref 0.61–1.24)
GFR, Estimated: 35 mL/min — ABNORMAL LOW (ref >60)
Glucose, Bld: 122 mg/dL — ABNORMAL HIGH (ref 70–99)
Potassium: 5.2 mmol/L — ABNORMAL HIGH (ref 3.5–5.1)
Sodium: 138 mmol/L (ref 135–145)
Total Bilirubin: 0.5 mg/dL (ref 0.3–1.2)
Total Protein: 5.9 g/dL — ABNORMAL LOW (ref 6.5–8.1)

## 2022-12-28 LAB — FERRITIN: Ferritin: 268 ng/mL (ref 24–336)

## 2022-12-28 LAB — IRON AND TIBC
Iron: 88 ug/dL (ref 45–182)
Saturation Ratios: 39 % (ref 17.9–39.5)
TIBC: 224 ug/dL — ABNORMAL LOW (ref 250–450)
UIBC: 136 ug/dL

## 2022-12-28 NOTE — Progress Notes (Signed)
Hgb is at 10.4; hold retacrit

## 2023-01-03 ENCOUNTER — Other Ambulatory Visit: Payer: Self-pay | Admitting: Family Medicine

## 2023-01-04 ENCOUNTER — Inpatient Hospital Stay: Payer: Medicare Other

## 2023-01-04 DIAGNOSIS — I129 Hypertensive chronic kidney disease with stage 1 through stage 4 chronic kidney disease, or unspecified chronic kidney disease: Secondary | ICD-10-CM | POA: Diagnosis not present

## 2023-01-04 DIAGNOSIS — D649 Anemia, unspecified: Secondary | ICD-10-CM

## 2023-01-04 DIAGNOSIS — D7581 Myelofibrosis: Secondary | ICD-10-CM

## 2023-01-04 DIAGNOSIS — D471 Chronic myeloproliferative disease: Secondary | ICD-10-CM | POA: Diagnosis not present

## 2023-01-04 DIAGNOSIS — N183 Anemia in chronic kidney disease: Secondary | ICD-10-CM

## 2023-01-04 DIAGNOSIS — M9903 Segmental and somatic dysfunction of lumbar region: Secondary | ICD-10-CM | POA: Diagnosis not present

## 2023-01-04 DIAGNOSIS — N1832 Chronic kidney disease, stage 3b: Secondary | ICD-10-CM | POA: Diagnosis not present

## 2023-01-04 DIAGNOSIS — M5412 Radiculopathy, cervical region: Secondary | ICD-10-CM | POA: Diagnosis not present

## 2023-01-04 DIAGNOSIS — M9901 Segmental and somatic dysfunction of cervical region: Secondary | ICD-10-CM | POA: Diagnosis not present

## 2023-01-04 DIAGNOSIS — D631 Anemia in chronic kidney disease: Secondary | ICD-10-CM | POA: Diagnosis not present

## 2023-01-04 DIAGNOSIS — M5033 Other cervical disc degeneration, cervicothoracic region: Secondary | ICD-10-CM | POA: Diagnosis not present

## 2023-01-04 LAB — HEMOGLOBIN AND HEMATOCRIT (CANCER CENTER ONLY)
HCT: 32.2 % — ABNORMAL LOW (ref 39.0–52.0)
Hemoglobin: 10 g/dL — ABNORMAL LOW (ref 13.0–17.0)

## 2023-01-04 NOTE — Progress Notes (Signed)
Injection held

## 2023-01-04 NOTE — Telephone Encounter (Signed)
Requested Prescriptions  Pending Prescriptions Disp Refills   levothyroxine (SYNTHROID) 25 MCG tablet [Pharmacy Med Name: LEVOTHYROXINE SODIUM 25 MCG TAB] 90 tablet 0    Sig: TAKE 1 TABLET EVERY DAY ON EMPTY STOMACHWITH A GLASS OF WATER AT LEAST 30-60 MINBEFORE BREAKFAST     Endocrinology:  Hypothyroid Agents Failed - 01/03/2023  1:05 PM      Failed - TSH in normal range and within 360 days    TSH  Date Value Ref Range Status  09/13/2022 7.200 (H) 0.450 - 4.500 uIU/mL Final         Passed - Valid encounter within last 12 months    Recent Outpatient Visits           1 month ago Upper respiratory tract infection, unspecified type   [facility], Megan P, DO   2 months ago Hyperkalemia   [facility], Megan P, DO   3 months ago Encounter for [employer] annual wellness exam   [facility], Megan P, DO   7 months ago Viral upper respiratory tract infection   [facility], Oswaldo Conroy, PA-C   11 months ago Upper respiratory tract infection, unspecified type   [facility], Oralia Rud, DO       Future Appointments             In 1 week Deirdre Evener, MD [facility]   In 2 months [name], DO [place] [facility]

## 2023-01-10 DIAGNOSIS — M170 Bilateral primary osteoarthritis of knee: Secondary | ICD-10-CM | POA: Diagnosis not present

## 2023-01-11 ENCOUNTER — Inpatient Hospital Stay: Payer: Medicare Other

## 2023-01-11 ENCOUNTER — Ambulatory Visit: Payer: Medicare Other | Admitting: Dermatology

## 2023-01-11 ENCOUNTER — Inpatient Hospital Stay: Payer: Medicare Other | Attending: Oncology

## 2023-01-11 VITALS — BP 140/64

## 2023-01-11 DIAGNOSIS — L820 Inflamed seborrheic keratosis: Secondary | ICD-10-CM

## 2023-01-11 DIAGNOSIS — Z79899 Other long term (current) drug therapy: Secondary | ICD-10-CM

## 2023-01-11 DIAGNOSIS — W908XXA Exposure to other nonionizing radiation, initial encounter: Secondary | ICD-10-CM

## 2023-01-11 DIAGNOSIS — I129 Hypertensive chronic kidney disease with stage 1 through stage 4 chronic kidney disease, or unspecified chronic kidney disease: Secondary | ICD-10-CM | POA: Insufficient documentation

## 2023-01-11 DIAGNOSIS — D649 Anemia, unspecified: Secondary | ICD-10-CM

## 2023-01-11 DIAGNOSIS — Z85820 Personal history of malignant melanoma of skin: Secondary | ICD-10-CM

## 2023-01-11 DIAGNOSIS — L409 Psoriasis, unspecified: Secondary | ICD-10-CM

## 2023-01-11 DIAGNOSIS — L821 Other seborrheic keratosis: Secondary | ICD-10-CM

## 2023-01-11 DIAGNOSIS — D7581 Myelofibrosis: Secondary | ICD-10-CM

## 2023-01-11 DIAGNOSIS — D631 Anemia in chronic kidney disease: Secondary | ICD-10-CM | POA: Insufficient documentation

## 2023-01-11 DIAGNOSIS — L570 Actinic keratosis: Secondary | ICD-10-CM | POA: Diagnosis not present

## 2023-01-11 DIAGNOSIS — D229 Melanocytic nevi, unspecified: Secondary | ICD-10-CM

## 2023-01-11 DIAGNOSIS — D471 Chronic myeloproliferative disease: Secondary | ICD-10-CM | POA: Diagnosis not present

## 2023-01-11 DIAGNOSIS — L578 Other skin changes due to chronic exposure to nonionizing radiation: Secondary | ICD-10-CM

## 2023-01-11 DIAGNOSIS — N184 Chronic kidney disease, stage 4 (severe): Secondary | ICD-10-CM | POA: Diagnosis not present

## 2023-01-11 DIAGNOSIS — D1801 Hemangioma of skin and subcutaneous tissue: Secondary | ICD-10-CM | POA: Diagnosis not present

## 2023-01-11 DIAGNOSIS — C44622 Squamous cell carcinoma of skin of right upper limb, including shoulder: Secondary | ICD-10-CM | POA: Diagnosis not present

## 2023-01-11 DIAGNOSIS — D492 Neoplasm of unspecified behavior of bone, soft tissue, and skin: Secondary | ICD-10-CM

## 2023-01-11 DIAGNOSIS — C4492 Squamous cell carcinoma of skin, unspecified: Secondary | ICD-10-CM

## 2023-01-11 DIAGNOSIS — L814 Other melanin hyperpigmentation: Secondary | ICD-10-CM

## 2023-01-11 DIAGNOSIS — Z1283 Encounter for screening for malignant neoplasm of skin: Secondary | ICD-10-CM

## 2023-01-11 HISTORY — DX: Squamous cell carcinoma of skin, unspecified: C44.92

## 2023-01-11 LAB — HEMOGLOBIN AND HEMATOCRIT (CANCER CENTER ONLY)
HCT: 30.5 % — ABNORMAL LOW (ref 39.0–52.0)
Hemoglobin: 9.7 g/dL — ABNORMAL LOW (ref 13.0–17.0)

## 2023-01-11 MED ORDER — EPOETIN ALFA-EPBX 20000 UNIT/ML IJ SOLN: 50000.0000 [IU] | Freq: Once | INTRAMUSCULAR | Status: DC

## 2023-01-11 MED ORDER — TRIAMCINOLONE ACETONIDE 0.1 % EX CREA: TOPICAL_CREAM | CUTANEOUS | 3 refills | Status: DC

## 2023-01-11 MED ORDER — EPOETIN ALFA-EPBX 40000 UNIT/ML IJ SOLN
40000.0000 [IU] | Freq: Once | INTRAMUSCULAR | Status: AC
  Administered 2023-01-11: 40000 [IU] via SUBCUTANEOUS
  Filled 2023-01-11: qty 1

## 2023-01-11 MED ORDER — EPOETIN ALFA-EPBX 10000 UNIT/ML IJ SOLN
10000.0000 [IU] | Freq: Once | INTRAMUSCULAR | Status: AC
  Administered 2023-01-11: 10000 [IU] via SUBCUTANEOUS
  Filled 2023-01-11: qty 1

## 2023-01-11 NOTE — Patient Instructions (Signed)
Due to recent changes in healthcare laws, you may see results of your pathology and/or laboratory studies on MyChart before the doctors have had a chance to review them. We understand that in some cases there may be results that are confusing or concerning to you. Please understand that not all results are received at the same time and often the doctors may need to interpret multiple results in order to provide you with the best plan of care or course of treatment. Therefore, we ask that you please give [name] 2 business days to thoroughly review all your results before contacting the office for clarification. Should we see a critical lab result, you will be contacted sooner.   If You Need Anything After Your Visit  If you have any questions or concerns for your doctor, please call our main line at [phone] and press option 4 to reach your doctor's medical assistant. If no one answers, please leave a voicemail as directed and we will return your call as soon as possible. Messages left after 4 pm will be answered the following business day.   You may also send [name] a message via MyChart. We typically respond to MyChart messages within 1-2 business days.  For prescription refills, please ask your pharmacy to contact our office. Our fax number is [phone].  If you have an urgent issue when the clinic is closed that cannot wait until the next business day, you can page your doctor at the number below.    Please note that while we do our best to be available for urgent issues outside of office hours, we are not available 24/7.   If you have an urgent issue and are unable to reach [name], you may choose to seek medical care at your doctor's office, retail clinic, urgent care center, or emergency room.  If you have a medical emergency, please immediately call 911 or go to the emergency department.  Pager Numbers  - Dr. Gwen Pounds: [phone]  - Dr. Roseanne Reno: [phone]  In the event of inclement  weather, please call our main line at [phone] for an update on the status of any delays or closures.  Dermatology Medication Tips: Please keep the boxes that topical medications come in in order to help keep track of the instructions about where and how to use these. Pharmacies typically print the medication instructions only on the boxes and not directly on the medication tubes.   If your medication is too expensive, please contact our office at [phone] option 4 or send [name] a message through MyChart.   We are unable to tell what your co-pay for medications will be in advance as this is different depending on your insurance coverage. However, we may be able to find a substitute medication at lower cost or fill out paperwork to get insurance to cover a needed medication.   If a prior authorization is required to get your medication covered by your insurance company, please allow [name] 1-2 business days to complete this process.  Drug prices often vary depending on where the prescription is filled and some pharmacies may offer cheaper prices.  The website www.goodrx.com contains coupons for medications through different pharmacies. The prices here do not account for what the cost may be with help from insurance (it may be cheaper with your insurance), but the website can give you the price if you did not use any insurance.  - You can print the associated coupon and take it with your prescription to the pharmacy.  -  You may also stop by our office during regular business hours and pick up a GoodRx coupon card.  - If you need your prescription sent electronically to a different pharmacy, notify our office through [facility] or by phone at [phone] option 4.     Si Usted Necesita Algo Despus de Su Visita  Tambin puede enviarnos un mensaje a travs de Clinical cytogeneticist. Por lo general respondemos a los mensajes de MyChart en el transcurso de 1 a 2 das hbiles.  Para renovar recetas,  por favor pida a su farmacia que se ponga en contacto con nuestra oficina. Annie Sable de fax es [place] [phone].  Si tiene un asunto urgente cuando la clnica est cerrada y que no puede esperar hasta el siguiente da hbil, puede llamar/localizar a su doctor(a) al nmero que aparece a continuacin.   Por favor, tenga en cuenta que aunque hacemos todo lo posible para estar disponibles para asuntos urgentes fuera del horario de [place], no estamos disponibles las 24 horas del da, los 7 [address]  [address] de la [place].   Si tiene un problema urgente y no puede comunicarse con nosotros, puede optar por buscar atencin mdica  en el consultorio de su doctor(a), en una clnica privada, en un centro de atencin urgente o en una sala de emergencias.  Si tiene Engineer, drilling, por favor llame inmediatamente al 911 o vaya a la sala de emergencias.  Nmeros de bper  - Dr. Gwen Pounds: [phone]  - Dra. Roseanne Reno: [phone]  En caso de inclemencias del [place], por favor llame a Lacy Duverney principal al [phone] para una actualizacin sobre el [place] de cualquier retraso o cierre.  Consejos para la medicacin en dermatologa: Por favor, guarde las cajas en las que vienen los medicamentos de uso tpico para ayudarle a seguir las instrucciones sobre dnde y cmo usarlos. Las farmacias generalmente imprimen las instrucciones del medicamento slo en las cajas y no directamente en los tubos del [place].   Si su medicamento es muy caro, por favor, pngase en contacto con Rolm Gala llamando al [phone] y presione la opcin 4 o envenos un mensaje a travs de Clinical cytogeneticist.   No podemos decirle cul ser su copago por los medicamentos por adelantado ya que esto es diferente dependiendo de la cobertura de su seguro. Sin embargo, es posible que podamos encontrar un medicamento sustituto a Audiological scientist un formulario para que el seguro cubra el medicamento que se considera necesario.   Si se  requiere una autorizacin previa para que su compaa de seguros [place] su medicamento, por favor permtanos de 1 a 2 das hbiles para completar [address]  Los precios de los medicamentos varan con frecuencia dependiendo del Environmental consultant de dnde se surte la receta y alguna farmacias pueden ofrecer precios ms baratos.  El sitio web www.goodrx.com tiene cupones para medicamentos de Health and safety inspector. Los precios aqu no tienen en cuenta lo que podra costar con la ayuda del seguro (puede ser ms barato con su seguro), pero el sitio web puede darle el precio si no utiliz Tourist information centre manager.  - Puede imprimir el cupn correspondiente y llevarlo con su receta a la farmacia.  - Tambin puede pasar por nuestra oficina durante el horario de atencin regular y Education officer, museum una tarjeta de cupones de GoodRx.  - Si necesita que su receta se enve electrnicamente a una farmacia diferente, informe a nuestra oficina a travs de MyChart de [place] o por telfono llamando al [phone] y presione la opcin 4.

## 2023-01-11 NOTE — Progress Notes (Signed)
Follow-Up Visit   Subjective  [name] is a 82 y.o. male who presents for the following: Skin Cancer Screening and Upper Body Skin Exam, hx of Melanoma, Patient c/o a irritated rash on he lower back, that will not go away.    The patient presents for Upper Body Skin Exam (UBSE) for skin cancer screening and mole check. The patient has spots, moles and lesions to be evaluated, some may be new or changing and the patient may have concern these could be cancer.    The following portions of the chart were reviewed this encounter and updated as appropriate: medications, allergies, medical history  Review of Systems:  No other skin or systemic complaints except as noted in HPI or Assessment and Plan.  Objective  Well appearing patient in no apparent distress; mood and affect are within normal limits.  All skin waist up examined. Relevant physical exam findings are noted in the Assessment and Plan.     right forearm 1.1 cm crusted papule          post auricular neck x 7 (7) Stuck-on, waxy, tan-brown papules -- Discussed benign etiology and prognosis.   face, ear x 9 (9) Erythematous thin papules/macules with gritty scale.     Assessment & Plan   Neoplasm of skin right forearm  Epidermal / dermal shaving  Lesion diameter (cm):  1.1 Informed consent: discussed and consent obtained   Timeout: patient name, date of birth, surgical site, and procedure verified   Procedure prep:  Patient was prepped and draped in usual sterile fashion Prep type:  Isopropyl alcohol Anesthesia: the lesion was anesthetized in a standard fashion   Anesthetic:  1% lidocaine w/ epinephrine 1-100,000 buffered w/ 8.4% NaHCO3 Hemostasis achieved with: pressure, aluminum chloride and electrodesiccation   Outcome: patient tolerated procedure well   Post-procedure details: sterile dressing applied and wound care instructions given   Dressing type: bandage and petrolatum    Destruction of  lesion  Destruction method: electrodesiccation and curettage   Informed consent: discussed and consent obtained   Timeout:  patient name, date of birth, surgical site, and procedure verified Curettage performed in three different directions: Yes   Electrodesiccation performed over the curetted area: Yes   Lesion length (cm):  1.1 Lesion width (cm):  1.1 Margin per side (cm):  0.2 Final wound size (cm):  1.5 Hemostasis achieved with:  pressure, aluminum chloride and electrodesiccation Outcome: patient tolerated procedure well with no complications   Post-procedure details: wound care instructions given    Specimen 1 - Surgical pathology Differential Diagnosis: R/O SCC  Check Margins: No  Inflamed seborrheic keratosis (7) post auricular neck x 7  Symptomatic, irritating, patient would like treated.   Destruction of lesion - post auricular neck x 7 (7) Complexity: simple   Destruction method: cryotherapy   Informed consent: discussed and consent obtained   Timeout:  patient name, date of birth, surgical site, and procedure verified Lesion destroyed using liquid nitrogen: Yes   Region frozen until ice ball extended beyond lesion: Yes   Outcome: patient tolerated procedure well with no complications   Post-procedure details: wound care instructions given    AK (actinic keratosis) (9) face, ear x 9  Actinic keratoses are precancerous spots that appear secondary to cumulative UV radiation exposure/sun exposure over time. They are chronic with expected duration over 1 year. A portion of actinic keratoses will progress to squamous cell carcinoma of the skin. It is not possible to reliably predict which  spots will progress to skin cancer and so treatment is recommended to prevent development of skin cancer.  Recommend daily broad spectrum sunscreen SPF 30+ to sun-exposed areas, reapply every 2 hours as needed.  Recommend staying in the shade or wearing long sleeves, sun glasses  (UVA+UVB protection) and wide brim hats (4-inch brim around the entire circumference of the hat). Call for new or changing lesions.   Destruction of lesion - face, ear x 9 (9) Complexity: simple   Destruction method: cryotherapy   Informed consent: discussed and consent obtained   Timeout:  patient name, date of birth, surgical site, and procedure verified Lesion destroyed using liquid nitrogen: Yes   Region frozen until ice ball extended beyond lesion: Yes   Outcome: patient tolerated procedure well with no complications   Post-procedure details: wound care instructions given     Skin cancer screening performed today.  Actinic Damage - Chronic condition, secondary to cumulative UV/sun exposure - diffuse scaly erythematous macules with underlying dyspigmentation - Recommend daily broad spectrum sunscreen SPF 30+ to sun-exposed areas, reapply every 2 hours as needed.  - Staying in the shade or wearing long sleeves, sun glasses (UVA+UVB protection) and wide brim hats (4-inch brim around the entire circumference of the hat) are also recommended for sun protection.  - Call for new or changing lesions.  Lentigines, Seborrheic Keratoses, Hemangiomas - Benign normal skin lesions - Benign-appearing - Call for any changes  Melanocytic Nevi - Tan-brown and/or pink-flesh-colored symmetric macules and papules - Benign appearing on exam today - Observation - Call clinic for new or changing moles - Recommend daily use of broad spectrum spf 30+ sunscreen to sun-exposed areas.   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  - Call if any new or changing lesions are noted between office visits   PSORIASIS Exam: Well-demarcated erythematous papules/plaques with silvery scale, guttate pink  scaly papules  Chronic and persistent condition with duration or expected duration over one year. Condition is symptomatic/ bothersome to patient. Not currently at goal.    Psoriasis is a chronic non-curable, but treatable genetic/hereditary disease that may have other systemic features affecting other organ systems such as joints (Psoriatic Arthritis). It is associated with an increased risk of inflammatory bowel disease, heart disease, non-alcoholic fatty liver disease, and depression.  Treatments include light and laser treatments; topical medications; and systemic medications including oral and injectables.  Treatment Plan: Start Triamcinolone 0.1% cream apply to affected skin twice a day 5 days a week prn   Topical steroids (such as triamcinolone, fluocinolone, fluocinonide, mometasone, clobetasol, halobetasol, betamethasone, hydrocortisone) can cause thinning and lightening of the skin if they are used for too long in the same area. Your physician has selected the right strength medicine for your problem and area affected on the body. Please use your medication only as directed by your physician to prevent side effects.     Return in about 2 months (around 03/13/2023) for Psoriasis .  IAngelique Holm, CMA, am acting as scribe for Armida Sans, MD .   Documentation: I have reviewed the above documentation for accuracy and completeness, and I agree with the above.  Armida Sans, MD

## 2023-01-17 ENCOUNTER — Telehealth: Payer: Self-pay

## 2023-01-17 NOTE — Telephone Encounter (Signed)
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

## 2023-01-17 NOTE — Telephone Encounter (Signed)
LM on VM please return my call  

## 2023-01-18 ENCOUNTER — Inpatient Hospital Stay: Payer: Medicare Other

## 2023-01-18 VITALS — BP 122/69

## 2023-01-18 DIAGNOSIS — D7581 Myelofibrosis: Secondary | ICD-10-CM

## 2023-01-18 DIAGNOSIS — D631 Anemia in chronic kidney disease: Secondary | ICD-10-CM

## 2023-01-18 DIAGNOSIS — D649 Anemia, unspecified: Secondary | ICD-10-CM

## 2023-01-18 DIAGNOSIS — I129 Hypertensive chronic kidney disease with stage 1 through stage 4 chronic kidney disease, or unspecified chronic kidney disease: Secondary | ICD-10-CM | POA: Diagnosis not present

## 2023-01-18 DIAGNOSIS — N184 Chronic kidney disease, stage 4 (severe): Secondary | ICD-10-CM | POA: Diagnosis not present

## 2023-01-18 DIAGNOSIS — D471 Chronic myeloproliferative disease: Secondary | ICD-10-CM | POA: Diagnosis not present

## 2023-01-18 DIAGNOSIS — N183 Chronic kidney disease, stage 3 unspecified: Secondary | ICD-10-CM

## 2023-01-18 LAB — HEMOGLOBIN AND HEMATOCRIT (CANCER CENTER ONLY)
HCT: 27 % — ABNORMAL LOW (ref 39.0–52.0)
Hemoglobin: 8.3 g/dL — ABNORMAL LOW (ref 13.0–17.0)

## 2023-01-18 MED ORDER — EPOETIN ALFA-EPBX 40000 UNIT/ML IJ SOLN
40000.0000 [IU] | Freq: Once | INTRAMUSCULAR | Status: AC
  Administered 2023-01-18: 40000 [IU] via SUBCUTANEOUS
  Filled 2023-01-18: qty 1

## 2023-01-18 MED ORDER — EPOETIN ALFA-EPBX 10000 UNIT/ML IJ SOLN
10000.0000 [IU] | Freq: Once | INTRAMUSCULAR | Status: AC
  Administered 2023-01-18: 10000 [IU] via SUBCUTANEOUS
  Filled 2023-01-18: qty 1

## 2023-01-18 MED ORDER — EPOETIN ALFA-EPBX 20000 UNIT/ML IJ SOLN: 50000.0000 [IU] | Freq: Once | INTRAMUSCULAR | Status: DC

## 2023-01-20 ENCOUNTER — Encounter: Payer: Self-pay | Admitting: Dermatology

## 2023-01-24 ENCOUNTER — Other Ambulatory Visit: Payer: Self-pay | Admitting: *Deleted

## 2023-01-24 ENCOUNTER — Telehealth: Payer: Self-pay

## 2023-01-24 DIAGNOSIS — D631 Anemia in chronic kidney disease: Secondary | ICD-10-CM

## 2023-01-24 DIAGNOSIS — N183 Chronic kidney disease, stage 3 unspecified: Secondary | ICD-10-CM

## 2023-01-24 DIAGNOSIS — M25511 Pain in right shoulder: Secondary | ICD-10-CM | POA: Diagnosis not present

## 2023-01-24 DIAGNOSIS — G8929 Other chronic pain: Secondary | ICD-10-CM | POA: Diagnosis not present

## 2023-01-24 NOTE — Telephone Encounter (Signed)
Patient informed of pathology results 

## 2023-01-24 NOTE — Telephone Encounter (Signed)
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

## 2023-01-25 ENCOUNTER — Inpatient Hospital Stay (HOSPITAL_BASED_OUTPATIENT_CLINIC_OR_DEPARTMENT_OTHER): Payer: Medicare Other | Admitting: Oncology

## 2023-01-25 ENCOUNTER — Encounter: Payer: Self-pay | Admitting: Oncology

## 2023-01-25 ENCOUNTER — Inpatient Hospital Stay: Payer: Medicare Other

## 2023-01-25 VITALS — BP 140/65 | HR 61 | Temp 96.4°F | Resp 18 | Ht 68.0 in | Wt 180.5 lb

## 2023-01-25 DIAGNOSIS — Z79899 Other long term (current) drug therapy: Secondary | ICD-10-CM

## 2023-01-25 DIAGNOSIS — M9903 Segmental and somatic dysfunction of lumbar region: Secondary | ICD-10-CM | POA: Diagnosis not present

## 2023-01-25 DIAGNOSIS — D631 Anemia in chronic kidney disease: Secondary | ICD-10-CM

## 2023-01-25 DIAGNOSIS — D649 Anemia, unspecified: Secondary | ICD-10-CM

## 2023-01-25 DIAGNOSIS — D471 Chronic myeloproliferative disease: Secondary | ICD-10-CM | POA: Diagnosis not present

## 2023-01-25 DIAGNOSIS — N183 Chronic kidney disease, stage 3 unspecified: Secondary | ICD-10-CM

## 2023-01-25 DIAGNOSIS — M5033 Other cervical disc degeneration, cervicothoracic region: Secondary | ICD-10-CM | POA: Diagnosis not present

## 2023-01-25 DIAGNOSIS — D7581 Myelofibrosis: Secondary | ICD-10-CM

## 2023-01-25 DIAGNOSIS — N184 Chronic kidney disease, stage 4 (severe): Secondary | ICD-10-CM | POA: Diagnosis not present

## 2023-01-25 DIAGNOSIS — I129 Hypertensive chronic kidney disease with stage 1 through stage 4 chronic kidney disease, or unspecified chronic kidney disease: Secondary | ICD-10-CM | POA: Diagnosis not present

## 2023-01-25 DIAGNOSIS — M9901 Segmental and somatic dysfunction of cervical region: Secondary | ICD-10-CM | POA: Diagnosis not present

## 2023-01-25 DIAGNOSIS — M5412 Radiculopathy, cervical region: Secondary | ICD-10-CM | POA: Diagnosis not present

## 2023-01-25 LAB — CBC WITH DIFFERENTIAL/PLATELET
Abs Immature Granulocytes: 0.46 10*3/uL — ABNORMAL HIGH (ref 0.00–0.07)
Basophils Absolute: 0 10*3/uL (ref 0.0–0.1)
Basophils Relative: 0 %
Eosinophils Absolute: 0 10*3/uL (ref 0.0–0.5)
Eosinophils Relative: 1 %
HCT: 27.2 % — ABNORMAL LOW (ref 39.0–52.0)
Hemoglobin: 8.4 g/dL — ABNORMAL LOW (ref 13.0–17.0)
Immature Granulocytes: 10 %
Lymphocytes Relative: 15 %
Lymphs Abs: 0.7 10*3/uL (ref 0.7–4.0)
MCH: 29.5 pg (ref 26.0–34.0)
MCHC: 30.9 g/dL (ref 30.0–36.0)
MCV: 95.4 fL (ref 80.0–100.0)
Monocytes Absolute: 0.6 10*3/uL (ref 0.1–1.0)
Monocytes Relative: 12 %
Neutro Abs: 2.9 10*3/uL (ref 1.7–7.7)
Neutrophils Relative %: 62 %
Platelets: 171 10*3/uL (ref 150–400)
RBC: 2.85 MIL/uL — ABNORMAL LOW (ref 4.22–5.81)
RDW: 19.1 % — ABNORMAL HIGH (ref 11.5–15.5)
Smear Review: ADEQUATE
WBC: 4.7 10*3/uL (ref 4.0–10.5)
nRBC: 1.3 % — ABNORMAL HIGH (ref 0.0–0.2)

## 2023-01-25 LAB — COMPREHENSIVE METABOLIC PANEL
ALT: 27 U/L (ref 0–44)
AST: 36 U/L (ref 15–41)
Albumin: 3.6 g/dL (ref 3.5–5.0)
Alkaline Phosphatase: 77 U/L (ref 38–126)
Anion gap: 5 (ref 5–15)
BUN: 34 mg/dL — ABNORMAL HIGH (ref 8–23)
CO2: 22 mmol/L (ref 22–32)
Calcium: 8.1 mg/dL — ABNORMAL LOW (ref 8.9–10.3)
Chloride: 111 mmol/L (ref 98–111)
Creatinine, Ser: 1.91 mg/dL — ABNORMAL HIGH (ref 0.61–1.24)
GFR, Estimated: 35 mL/min — ABNORMAL LOW (ref >60)
Glucose, Bld: 108 mg/dL — ABNORMAL HIGH (ref 70–99)
Potassium: 4.6 mmol/L (ref 3.5–5.1)
Sodium: 138 mmol/L (ref 135–145)
Total Bilirubin: 0.7 mg/dL (ref 0.3–1.2)
Total Protein: 5.9 g/dL — ABNORMAL LOW (ref 6.5–8.1)

## 2023-01-25 LAB — IRON AND TIBC
Iron: 51 ug/dL (ref 45–182)
Saturation Ratios: 24 % (ref 17.9–39.5)
TIBC: 217 ug/dL — ABNORMAL LOW (ref 250–450)
UIBC: 166 ug/dL

## 2023-01-25 LAB — FERRITIN: Ferritin: 241 ng/mL (ref 24–336)

## 2023-01-25 MED ORDER — EPOETIN ALFA-EPBX 40000 UNIT/ML IJ SOLN
40000.0000 [IU] | Freq: Once | INTRAMUSCULAR | Status: AC
  Administered 2023-01-25: 40000 [IU] via SUBCUTANEOUS
  Filled 2023-01-25: qty 1

## 2023-01-25 MED ORDER — EPOETIN ALFA-EPBX 10000 UNIT/ML IJ SOLN
10000.0000 [IU] | Freq: Once | INTRAMUSCULAR | Status: AC
  Administered 2023-01-25: 10000 [IU] via SUBCUTANEOUS
  Filled 2023-01-25: qty 1

## 2023-01-25 MED ORDER — EPOETIN ALFA-EPBX 20000 UNIT/ML IJ SOLN: 50000.0000 [IU] | INTRAMUSCULAR | Status: DC

## 2023-01-25 NOTE — Progress Notes (Signed)
Hematology/Oncology Consult note [facility]  [phone] [phone]  Patient Care Team: [name], DO as PCP - General (Family Medicine) Jolayne Panther, MD as Referring Physician (Hematology) Sherrie George, MD as Consulting Physician (Ophthalmology) Micki Riley, MD as Consulting Physician (Neurology) Jerilee Field, MD as Consulting Physician (Urology) Antonieta Iba, MD as Consulting Physician (Cardiology) Riki Altes, MD (Urology) Creig Hines, MD as Consulting Physician (Hematology and Oncology)   Name of the patient: [name]  [id number]  05-Dec-1940   Date of visit: 01/25/23  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  At that time he was found to have a mild splenomegaly of 15.8 cm.DIPPS score is 23 (age 43- 1, hemoglobin less than 10- 2) and score of 4 if 1% circulating blasts included from 07/17/2014.   Bone marrow on 06/11/2014 was most consistent with primary myelofibrosis.  Bone marrow biopsy showed 1% abnormal cells: CD45+, CD5+, CD10, CD11c+/-, CD19+, CD2-+, (dim), CD22+ (dim, CD23+, CD38-/+, FMC7-, HLA-DR+, sig lambda+(dim).  Blasts were not increased 1.2%; hypercellular for age: 29%; JAK2 V617F mutation was negative.  CALR mutation positive.  Flow cytometry included about 1% CLL/SLL phenotype cells (CD5+) of uncertain significance and some infiltrate into the marrow with increased atypical megakaryocytes.  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  MDS FISH panel was negative.   Patient also follows up with [facility] benign hematology Dr. Ester Rink for his anemia.  Patient's hemoglobin was drifting down to the eights and was started on EPO in  October 2022.  Interval history-patient is doing well for his age and denies any specific complaints at this time.  Tolerating weekly EPO injections well without any significant side effects.  ECOG PS- 1 Pain scale- 0   Review of systems- Review of Systems  Constitutional:  Negative for chills, fever, malaise/fatigue and weight loss.  HENT:  Negative for congestion, ear discharge and nosebleeds.   Eyes:  Negative for blurred vision.  Respiratory:  Negative for cough, hemoptysis, sputum production, shortness of breath and wheezing.   Cardiovascular:  Negative for chest pain, palpitations, orthopnea and claudication.  Gastrointestinal:  Negative for abdominal pain, blood in stool, constipation, diarrhea, heartburn, melena, nausea and vomiting.  Genitourinary:  Negative for dysuria, flank pain, frequency, hematuria and urgency.  Musculoskeletal:  Negative for back pain, joint pain and myalgias.  Skin:  Negative for rash.  Neurological:  Negative for dizziness, tingling, focal weakness, seizures, weakness and headaches.  Endo/Heme/Allergies:  Does not bruise/bleed easily.  Psychiatric/Behavioral:  Negative for depression and suicidal ideas. The patient does not have insomnia.       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   right forearm, [facility]   Stroke (HCC) 11/2007   R brain subcortical infarct  Past Surgical History:  Procedure Laterality Date   ASPIRATION / INJECTION RENAL CYST  Feb 2017   BACK SURGERY     approx 20- 25 years ago   GAS INSERTION   08/11/2011   Procedure: INSERTION OF GAS;  Surgeon: Sherrie George, MD;  Location: [facility] OR;  Service: Ophthalmology;  Laterality: Right;  [id number]   SCLERAL BUCKLE  08/11/2011   Procedure: SCLERAL BUCKLE;  Surgeon: Sherrie George, MD;  Location: [facility] OR;  Service: Ophthalmology;  Laterality: Right;   VARICOSE VEIN SURGERY      Social History   Socioeconomic History   Marital status: Married    Spouse name: [name]    Number of children: 2   Years of education: 12+   Highest education level: Some college, no degree  Occupational History   Occupation: Fish farm manager: OTHER    Comment: community   Occupation: SELF EMPLOYED    Employer: SELF EMPLOYED  Tobacco Use   Smoking status: Former    Current packs/day: 0.00    Types: Cigarettes    Quit date: 06/06/2010    Years since quitting: 12.6   Smokeless tobacco: Never  Vaping Use   Vaping status: Never Used  Substance and Sexual Activity   Alcohol use: No    Alcohol/week: 0.0 standard drinks of alcohol   Drug use: No   Sexual activity: Not Currently  Other Topics Concern   Not on file  Social History Narrative   Pt lives at home with his family.   Caffeine Use- 2 cups daily   Patient has 2 children.    Patient has some college.    Patient is right handed.          Works full time   Chemical engineer Strain: Low Risk  (07/12/2021)   Overall Financial Resource Strain (CARDIA)    Difficulty of Paying Living Expenses: Not hard at all  Food Insecurity: No Food Insecurity (07/12/2021)   Hunger Vital Sign    Worried About Running Out of Food in the Last Year: Never true    Ran Out of Food in the Last Year: Never true  Transportation Needs: No Transportation Needs (07/12/2021)   PRAPARE - Administrator, Civil Service (Medical): No    Lack of Transportation (Non-Medical): No  Physical Activity: Inactive (07/12/2021)   Exercise Vital Sign    Days of Exercise per Week: 0 days    Minutes of  Exercise per Session: 0 min  Stress: No Stress Concern Present (07/12/2021)   [employer] of Occupational Health - Occupational Stress Questionnaire    Feeling of Stress : Not at all  Social Connections: Socially Integrated (07/12/2021)   Social Connection and Isolation Panel [NHANES]    Frequency of Communication with Friends and Family: Three times a week    Frequency of Social Gatherings with Friends and Family: More than three times a week    Attends Religious Services: More than 4 times per year    Active Member of [employer] or Organizations: Yes    Attends Banker Meetings: More than 4 times per year    Marital Status: Married  Catering manager Violence: Not At Risk (07/12/2021)   Humiliation, Afraid, Rape, and Kick questionnaire    Fear of Current or Ex-Partner: No    Emotionally Abused: No    Physically Abused: No    Sexually Abused: No    Family History  Problem  Relation Age of Onset   Heart disease Father    Diabetes Son    Kidney disease Neg Hx    Prostate cancer Neg Hx      Current Outpatient Medications:    albuterol (VENTOLIN HFA) 108 (90 Base) MCG/ACT inhaler, Inhale 2 puffs into the lungs every 6 (six) hours as needed for wheezing or shortness of breath., Disp: 8 g, Rfl: 12   amLODipine (NORVASC) 10 MG tablet, Take 1 tablet (10 mg total) by mouth every evening., Disp: 90 tablet, Rfl: 1   azelastine (ASTELIN) 0.1 % nasal spray, Place 1 spray into both nostrils 2 (two) times daily. Use in each nostril as directed, Disp: 30 mL, Rfl: 0   calcium carbonate (OSCAL) 1500 (600 Ca) MG TABS tablet, Take by mouth., Disp: , Rfl:    Cholecalciferol (D3-1000 PO), Take by mouth daily., Disp: , Rfl:    clopidogrel (PLAVIX) 75 MG tablet, Take 1 tablet (75 mg total) by mouth daily., Disp: 90 tablet, Rfl: 1   DULoxetine (CYMBALTA) 20 MG capsule, TAKE 1 CAPSULE BY MOUTH DAILY., Disp: 90 capsule, Rfl: 1   gabapentin (NEURONTIN) 400 MG capsule, Take 1 tab in the AM and 2 tabs  at bedtime., Disp: 270 capsule, Rfl: 1   Ginsengs-Saw Palmetto (MULTI GINSENG & SAW PALMETTO) 500 MG CAPS, Take 500 mg by mouth daily., Disp: , Rfl:    levothyroxine (SYNTHROID) 25 MCG tablet, TAKE 1 TABLET EVERY DAY ON EMPTY STOMACHWITH A GLASS OF WATER AT LEAST 30-60 MINBEFORE BREAKFAST, Disp: 90 tablet, Rfl: 0   losartan (COZAAR) 100 MG tablet, Take 1 tablet (100 mg total) by mouth every morning., Disp: 90 tablet, Rfl: 1   meclizine (ANTIVERT) 25 MG tablet, TAKE 1 TABLET BY MOUTH 3 TIMES DAILY AS NEEDED FOR DIZZINESS, Disp: 90 tablet, Rfl: 6   Multiple Vitamin (MULITIVITAMIN WITH MINERALS) TABS, Take 1 tablet by mouth daily., Disp: , Rfl:    Naproxen Sodium 220 MG CAPS, Take by mouth as needed., Disp: , Rfl:    omeprazole (PRILOSEC) 40 MG capsule, Take 1 capsule (40 mg total) by mouth daily., Disp: 90 capsule, Rfl: 1   patiromer (VELTASSA) 8.4 g packet, Take 1 packet (8.4 g total) by mouth daily., Disp: 5 each, Rfl: 0   predniSONE (DELTASONE) 50 MG tablet, Take 1 tablet (50 mg total) by mouth daily with breakfast., Disp: 5 tablet, Rfl: 0   simvastatin (ZOCOR) 40 MG tablet, Take 1 tablet (40 mg total) by mouth daily., Disp: 90 tablet, Rfl: 1   tadalafil (CIALIS) 5 MG tablet, Take 1 tablet (5 mg total) by mouth daily., Disp: 90 tablet, Rfl: 3   tamsulosin (FLOMAX) 0.4 MG CAPS capsule, Take 2 capsules (0.8 mg total) by mouth daily., Disp: 180 capsule, Rfl: 1   triamcinolone cream (KENALOG) 0.1 %, Apply to affected skin rash on buttock twice a day for 5 days, each week prn, Disp: 80 g, Rfl: 3   zinc gluconate 50 MG tablet, Take by mouth., Disp: , Rfl:  No current facility-administered medications for this visit.  Facility-Administered Medications Ordered in Other Visits:    epoetin alfa-epbx (RETACRIT) injection 10,000 Units, 10,000 Units, Subcutaneous, Once, Creig Hines, MD   epoetin alfa-epbx (RETACRIT) injection 40,000 Units, 40,000 Units, Subcutaneous, Once, Creig Hines, MD  Physical  exam:  Vitals:   01/25/23 1012  BP: (!) 140/65  Pulse: 61  Resp: 18  Temp: (!) 96.4 F (35.8 C)  TempSrc: Tympanic  SpO2: 98%  Weight: 180  lb 8 oz (81.9 kg)  Height: 5\' 8"  (1.727 m)   Physical Exam Cardiovascular:     Rate and Rhythm: Normal rate and regular rhythm.     Heart sounds: Normal heart sounds.  Pulmonary:     Effort: Pulmonary effort is normal.     Breath sounds: Normal breath sounds.  Abdominal:     General: Bowel sounds are normal.     Palpations: Abdomen is soft.  Skin:    General: Skin is warm and dry.  Neurological:     Mental Status: He is alert and oriented to person, place, and time.         Latest Ref Rng & Units 01/25/2023    9:43 AM  CMP  Glucose 70 - 99 mg/dL 409   BUN 8 - 23 mg/dL 34   Creatinine 8.11 - 1.24 mg/dL 9.14   Sodium 782 - 956 mmol/L 138   Potassium 3.5 - 5.1 mmol/L 4.6   Chloride 98 - 111 mmol/L 111   CO2 22 - 32 mmol/L 22   Calcium 8.9 - 10.3 mg/dL 8.1   Total Protein 6.5 - 8.1 g/dL 5.9   Total Bilirubin 0.3 - 1.2 mg/dL 0.7   Alkaline Phos 38 - 126 U/L 77   AST 15 - 41 U/L 36   ALT 0 - 44 U/L 27       Latest Ref Rng & Units 01/25/2023    9:43 AM  CBC  WBC 4.0 - 10.5 K/uL 4.7   Hemoglobin 13.0 - 17.0 g/dL 8.4   Hematocrit 21.3 - 52.0 % 27.2   Platelets 150 - 400 K/uL 171     Assessment and plan- Patient is a 82 y.o. male with history of primary myelofibrosis predominantly anemia currently on Retacrit here for routine follow-up  Patient's hemoglobin has been between 9-10 then stable at that level with Retacrit since October 2022 when we started the treatment.  Over the last 1 month his hemoglobin has drifted down to the eights and is presently at 8.4.  He had received 1 dose of Monoferric in May 2024 and labs are currently not suggestive of iron deficiency.  I will continue to monitor his H&H and he will continue weekly retacrit  and if there is no improvement in his hemoglobin back to 9-10 range in the next 1 month or so,  we could consider danazol for his anemia.  I will also reach out to Dr. 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  I will plan to get MRI abdomen with and without contrast to further characterize this mass as well as to assess the status of his spleen.He has had chronic splenomegaly but patient currently denies any worsening abdominal pain or early satiety.  Labs and see me in 3 months   Visit Diagnosis 1. Anemia of chronic kidney failure, stage 3 (moderate) (HCC)   2. Erythropoietin (EPO) stimulating agent anemia management patient   3. Myelofibrosis (HCC)      Dr. Owens Shark, MD, MPH [facility] [phone] 01/25/2023 12:43 PM

## 2023-01-26 ENCOUNTER — Telehealth: Payer: Self-pay | Admitting: *Deleted

## 2023-01-26 ENCOUNTER — Other Ambulatory Visit: Payer: Self-pay | Admitting: *Deleted

## 2023-01-26 DIAGNOSIS — R935 Abnormal findings on diagnostic imaging of other abdominal regions, including retroperitoneum: Secondary | ICD-10-CM

## 2023-01-26 DIAGNOSIS — D7581 Myelofibrosis: Secondary | ICD-10-CM

## 2023-01-26 DIAGNOSIS — D631 Anemia in chronic kidney disease: Secondary | ICD-10-CM

## 2023-01-26 NOTE — Telephone Encounter (Signed)
I called [name] today and told him that after he had left from the visit Dr. Smith Robert had seen a ultrasound renal scan done by Dr. Thedore Mins.  She saw that you have a solid vascular mass in the liver.  Dr. Smith Robert says that we should do it MRI to see what is going on in the liver.  I asked patient if that is okay for [place] to schedule an MRI for him.  He is okay with that and he would like it to be in the middle of the week and on an afternoon.  I told him I will put the order in and then we will call them back when we get the appointment.  Patient is okay with that plan

## 2023-01-30 ENCOUNTER — Other Ambulatory Visit: Payer: Self-pay

## 2023-01-30 MED ORDER — TRIAMCINOLONE ACETONIDE 0.1 % EX CREA: TOPICAL_CREAM | CUTANEOUS | 3 refills | Status: DC

## 2023-02-01 ENCOUNTER — Inpatient Hospital Stay: Payer: Medicare Other

## 2023-02-01 VITALS — BP 168/62

## 2023-02-01 DIAGNOSIS — S46011A Strain of muscle(s) and tendon(s) of the rotator cuff of right shoulder, initial encounter: Secondary | ICD-10-CM | POA: Insufficient documentation

## 2023-02-01 DIAGNOSIS — I129 Hypertensive chronic kidney disease with stage 1 through stage 4 chronic kidney disease, or unspecified chronic kidney disease: Secondary | ICD-10-CM | POA: Diagnosis not present

## 2023-02-01 DIAGNOSIS — D7581 Myelofibrosis: Secondary | ICD-10-CM

## 2023-02-01 DIAGNOSIS — M5412 Radiculopathy, cervical region: Secondary | ICD-10-CM | POA: Diagnosis not present

## 2023-02-01 DIAGNOSIS — M12811 Other specific arthropathies, not elsewhere classified, right shoulder: Secondary | ICD-10-CM | POA: Diagnosis not present

## 2023-02-01 DIAGNOSIS — N184 Chronic kidney disease, stage 4 (severe): Secondary | ICD-10-CM | POA: Diagnosis not present

## 2023-02-01 DIAGNOSIS — D471 Chronic myeloproliferative disease: Secondary | ICD-10-CM | POA: Diagnosis not present

## 2023-02-01 DIAGNOSIS — M7581 Other shoulder lesions, right shoulder: Secondary | ICD-10-CM | POA: Insufficient documentation

## 2023-02-01 DIAGNOSIS — D631 Anemia in chronic kidney disease: Secondary | ICD-10-CM

## 2023-02-01 DIAGNOSIS — Z79899 Other long term (current) drug therapy: Secondary | ICD-10-CM

## 2023-02-01 DIAGNOSIS — M9903 Segmental and somatic dysfunction of lumbar region: Secondary | ICD-10-CM | POA: Diagnosis not present

## 2023-02-01 DIAGNOSIS — M9901 Segmental and somatic dysfunction of cervical region: Secondary | ICD-10-CM | POA: Diagnosis not present

## 2023-02-01 DIAGNOSIS — M5033 Other cervical disc degeneration, cervicothoracic region: Secondary | ICD-10-CM | POA: Diagnosis not present

## 2023-02-01 LAB — HEMOGLOBIN AND HEMATOCRIT (CANCER CENTER ONLY)
HCT: 31.1 % — ABNORMAL LOW (ref 39.0–52.0)
Hemoglobin: 9.5 g/dL — ABNORMAL LOW (ref 13.0–17.0)

## 2023-02-01 MED ORDER — EPOETIN ALFA-EPBX 10000 UNIT/ML IJ SOLN
10000.0000 [IU] | Freq: Once | INTRAMUSCULAR | Status: AC
  Administered 2023-02-01: 10000 [IU] via SUBCUTANEOUS
  Filled 2023-02-01: qty 1

## 2023-02-01 MED ORDER — EPOETIN ALFA-EPBX 20000 UNIT/ML IJ SOLN: 50000.0000 [IU] | INTRAMUSCULAR | Status: DC

## 2023-02-01 MED ORDER — EPOETIN ALFA-EPBX 40000 UNIT/ML IJ SOLN
40000.0000 [IU] | Freq: Once | INTRAMUSCULAR | Status: AC
  Administered 2023-02-01: 40000 [IU] via SUBCUTANEOUS
  Filled 2023-02-01: qty 1

## 2023-02-02 ENCOUNTER — Encounter: Payer: Self-pay | Admitting: Family Medicine

## 2023-02-02 ENCOUNTER — Ambulatory Visit (INDEPENDENT_AMBULATORY_CARE_PROVIDER_SITE_OTHER): Payer: Medicare Other | Admitting: Family Medicine

## 2023-02-02 ENCOUNTER — Ambulatory Visit: Payer: Self-pay

## 2023-02-02 ENCOUNTER — Ambulatory Visit
Admission: RE | Admit: 2023-02-02 | Discharge: 2023-02-02 | Disposition: A | Discharge status: Home / Self Care | Payer: Medicare Other | Source: Ambulatory Visit | Attending: Oncology | Admitting: Oncology

## 2023-02-02 VITALS — BP 119/63 | HR 81 | Wt 177.6 lb

## 2023-02-02 DIAGNOSIS — M545 Low back pain, unspecified: Secondary | ICD-10-CM | POA: Diagnosis not present

## 2023-02-02 DIAGNOSIS — R200 Anesthesia of skin: Secondary | ICD-10-CM | POA: Diagnosis not present

## 2023-02-02 DIAGNOSIS — R935 Abnormal findings on diagnostic imaging of other abdominal regions, including retroperitoneum: Secondary | ICD-10-CM

## 2023-02-02 DIAGNOSIS — M5033 Other cervical disc degeneration, cervicothoracic region: Secondary | ICD-10-CM | POA: Diagnosis not present

## 2023-02-02 DIAGNOSIS — D631 Anemia in chronic kidney disease: Secondary | ICD-10-CM | POA: Diagnosis not present

## 2023-02-02 DIAGNOSIS — R161 Splenomegaly, not elsewhere classified: Secondary | ICD-10-CM | POA: Diagnosis not present

## 2023-02-02 DIAGNOSIS — D7581 Myelofibrosis: Secondary | ICD-10-CM

## 2023-02-02 DIAGNOSIS — R531 Weakness: Secondary | ICD-10-CM | POA: Diagnosis not present

## 2023-02-02 DIAGNOSIS — M48061 Spinal stenosis, lumbar region without neurogenic claudication: Secondary | ICD-10-CM | POA: Diagnosis not present

## 2023-02-02 DIAGNOSIS — M9903 Segmental and somatic dysfunction of lumbar region: Secondary | ICD-10-CM | POA: Diagnosis not present

## 2023-02-02 DIAGNOSIS — N183 Chronic kidney disease, stage 3 unspecified: Secondary | ICD-10-CM

## 2023-02-02 DIAGNOSIS — R296 Repeated falls: Secondary | ICD-10-CM | POA: Diagnosis not present

## 2023-02-02 DIAGNOSIS — M4316 Spondylolisthesis, lumbar region: Secondary | ICD-10-CM | POA: Diagnosis not present

## 2023-02-02 DIAGNOSIS — M4856XA Collapsed vertebra, not elsewhere classified, lumbar region, initial encounter for fracture: Secondary | ICD-10-CM | POA: Diagnosis not present

## 2023-02-02 DIAGNOSIS — Z888 Allergy status to other drugs, medicaments and biological substances status: Secondary | ICD-10-CM | POA: Diagnosis not present

## 2023-02-02 DIAGNOSIS — M5412 Radiculopathy, cervical region: Secondary | ICD-10-CM | POA: Diagnosis not present

## 2023-02-02 DIAGNOSIS — M5136 Other intervertebral disc degeneration, lumbar region: Secondary | ICD-10-CM | POA: Diagnosis not present

## 2023-02-02 DIAGNOSIS — N281 Cyst of kidney, acquired: Secondary | ICD-10-CM | POA: Diagnosis not present

## 2023-02-02 DIAGNOSIS — K769 Liver disease, unspecified: Secondary | ICD-10-CM | POA: Diagnosis not present

## 2023-02-02 DIAGNOSIS — Z85828 Personal history of other malignant neoplasm of skin: Secondary | ICD-10-CM | POA: Diagnosis not present

## 2023-02-02 DIAGNOSIS — R29898 Other symptoms and signs involving the musculoskeletal system: Secondary | ICD-10-CM | POA: Diagnosis not present

## 2023-02-02 DIAGNOSIS — M9901 Segmental and somatic dysfunction of cervical region: Secondary | ICD-10-CM | POA: Diagnosis not present

## 2023-02-02 DIAGNOSIS — D1803 Hemangioma of intra-abdominal structures: Secondary | ICD-10-CM | POA: Diagnosis not present

## 2023-02-02 DIAGNOSIS — Z8673 Personal history of transient ischemic attack (TIA), and cerebral infarction without residual deficits: Secondary | ICD-10-CM | POA: Diagnosis not present

## 2023-02-02 DIAGNOSIS — M47816 Spondylosis without myelopathy or radiculopathy, lumbar region: Secondary | ICD-10-CM | POA: Diagnosis not present

## 2023-02-02 MED ORDER — GADOBUTROL 1 MMOL/ML IV SOLN
9.0000 mL | Freq: Once | INTRAVENOUS | Status: AC | PRN
  Administered 2023-02-02: 9 mL via INTRAVENOUS

## 2023-02-02 NOTE — Patient Instructions (Signed)
Go to the emergency room

## 2023-02-02 NOTE — Telephone Encounter (Signed)
Chief Complaint: Falls Symptoms: 4 falls today, skin tears to the right arm, right leg weakness, back pain 5/10 Frequency: 4 episodes of falls today  Pertinent Negatives: Patient denies new medication, head injury,  Disposition: [] ED /[] Urgent Care (no appt availability in office) / [x] Appointment(In office/virtual)/ []  [facility]/ [] Home Care/ [] Refused Recommended Disposition /[] [facility]/ []  Follow-up with PCP Additional Notes: Spoke to patient and wife on the line. Patient's wife stated that the patient has fallen in the house, outside of the house and at the [facility] today with a total of 4 falls today. Patient stated each time was due to the right leg giving out on him. He states he has a weak right leg and believes it may be due to a pinched nerve in his back. Patient states he has not had any falls before and this is a new onset. Care advice was given and patient has been scheduled for an appointment today at 1520. Advised wife if patient falls again and injury occurs to call 911 to have patient transported to the ED. Wife and patient verbalized understanding.   Reason for Disposition  [1] MODERATE weakness (i.e., interferes with work, school, normal activities) AND [2] new-onset or worsening  Answer Assessment - Initial Assessment Questions 1. MECHANISM: "How did the fall happen?"     He was going out of the kitchen door and his right leg just gave out on him  2. DOMESTIC VIOLENCE AND ELDER ABUSE SCREENING: "Did you fall because someone pushed you or tried to hurt you?" If Yes, ask: "Are you safe now?"     He just fell outside  3. ONSET: "When did the fall happen?" (e.g., minutes, hours, or days ago)     Today about 45 minutes ago  4. LOCATION: "What part of the body hit the ground?" (e.g., back, buttocks, head, hips, knees, hands, head, stomach)     Right leg, right arm  5. INJURY: "Did you hurt (injure) yourself when you fell?" If Yes, ask: "What did you injure?  Tell me more about this?" (e.g., body area; type of injury; pain severity)"     Yes, he has a bloody right arm  6. PAIN: "Is there any pain?" If Yes, ask: "How bad is the pain?" (e.g., Scale 1-10; or mild,  moderate, severe)   - NONE (0): No pain   - MILD (1-3): Doesn't interfere with normal activities    - MODERATE (4-7): Interferes with normal activities or awakens from sleep    - SEVERE (8-10): Excruciating pain, unable to do any normal activities      5/10 7. SIZE: For cuts, bruises, or swelling, ask: "How large is it?" (e.g., inches or centimeters)      Skin tears on the right arm       9. OTHER SYMPTOMS: "Do you have any other symptoms?" (e.g., dizziness, fever, weakness; new onset or worsening).      Back pain, weakness in the right leg  10. CAUSE: "What do you think caused the fall (or falling)?" (e.g., tripped, dizzy spell)       Right leg weakness  Protocols used: Falls and [facility]

## 2023-02-02 NOTE — Progress Notes (Signed)
BP 119/63   Pulse 81   Wt 177 lb 9.6 oz (80.6 kg)   SpO2 96%   BMI 27.00 kg/m    Subjective:    Patient ID: [name], male    DOB: [birth year], 82 y.o.   MRN: [id number]  HPI: [name] is a 82 y.o. male  Chief Complaint  Patient presents with   Fall    Patient says two nights ago, he was getting out of his recliner. Patient says he had a pain that shoots from his leg up into his R hip. Patient says the pain is getting worse since this morning when he got up. Patient says he went for an MRI this morning, and says since the MRI that he has fallen 6 times.    WEAKNESS Patient complains of waking up this morning with weakness of his right leg with difficulty putting his pants on and raising his leg. He admits to falling x6 occurrences today. He describes each occurrence being caused by weakness of the right leg. He denies hitting head, loss of consciousness, pain, headaches, light headedness, confusion, dizziness, vision disturbances, numbness, or tingling. He is seeing chiropractor for lower back pain due to history of bulging disc of spine.  .  Duration:Today Onset: sudden Location: Right leg Bilateral: no Symmetric: no Decreased sensation: no  Weakness: yes Pain: no Trauma: no Recent illness: no Spinal cord injury: no bulging disc  Relevant past medical, surgical, family and social history reviewed and updated as indicated. Interim medical history since our last visit reviewed. Allergies and medications reviewed and updated.  Review of Systems  Eyes:  Negative for visual disturbance.  Respiratory: Negative.    Cardiovascular: Negative.   Musculoskeletal:  Positive for back pain and gait problem. Negative for arthralgias and myalgias.  Skin: Negative.   Neurological:  Positive for weakness. Negative for dizziness, light-headedness, numbness and headaches.  Psychiatric/Behavioral:  Negative for confusion.     Per HPI unless specifically indicated above      Objective:    BP 119/63   Pulse 81   Wt 177 lb 9.6 oz (80.6 kg)   SpO2 96%   BMI 27.00 kg/m   Wt Readings from Last 3 Encounters:  02/02/23 177 lb 9.6 oz (80.6 kg)  01/25/23 180 lb 8 oz (81.9 kg)  11/30/22 174 lb (78.9 kg)    Physical Exam Vitals and nursing note reviewed.  Constitutional:      General: He is awake. He is not in acute distress.    Appearance: Normal appearance. He is well-developed and well-groomed. He is not ill-appearing.  HENT:     Head: Normocephalic and atraumatic.     Right Ear: Hearing and external ear normal. No drainage.     Left Ear: Hearing and external ear normal. No drainage.     Nose: Nose normal.  Eyes:     General: Lids are normal.        Right eye: No discharge.        Left eye: No discharge.     Extraocular Movements: Extraocular movements intact.     Conjunctiva/sclera: Conjunctivae normal.  Cardiovascular:     Rate and Rhythm: Normal rate and regular rhythm.     Pulses:          Radial pulses are 2+ on the right side and 2+ on the left side.       Popliteal pulses are 2+ on the right side and 2+ on the left side.  Posterior tibial pulses are 2+ on the right side and 2+ on the left side.     Heart sounds: Normal heart sounds, S1 normal and S2 normal. No murmur heard.    No gallop.  Pulmonary:     Effort: Pulmonary effort is normal. No accessory muscle usage or respiratory distress.     Breath sounds: Normal breath sounds.  Musculoskeletal:        General: Normal range of motion.     Cervical back: Full passive range of motion without pain and normal range of motion.     Right lower leg: No edema.     Left lower leg: No edema.  Skin:    General: Skin is warm and dry.     Capillary Refill: Capillary refill takes less than 2 seconds.  Neurological:     Mental Status: He is alert and oriented to person, place, and time.     Cranial Nerves: Cranial nerves 2-12 are intact.     Sensory: Sensory deficit (Plantar surface of right  foot) present.     Motor: Weakness (Right leg) present.     Gait: Gait abnormal.  Psychiatric:        Attention and Perception: Attention normal.        Mood and Affect: Mood normal.        Speech: Speech normal.        Behavior: Behavior normal. Behavior is cooperative.        Thought Content: Thought content normal.     Results for orders placed or performed in visit on 02/01/23  Hemoglobin and Hematocrit (Cancer Center Only)  Result Value Ref Range   Hemoglobin 9.5 (L) 13.0 - 17.0 g/dL   HCT 16.1 (L) 09.6 - 04.5 %      Assessment & Plan:   Problem List Items Addressed This Visit   None Visit Diagnoses     Weakness of left leg    -  Primary   Acute, ongoing. Advised to go to ED for unilateral weakness for rule out TIA. [facility] notified, patient in agreement and verbalized understanding.        Follow up plan: Return if symptoms worsen or fail to improve.

## 2023-02-03 ENCOUNTER — Telehealth: Payer: Self-pay | Admitting: Neurosurgery

## 2023-02-03 ENCOUNTER — Other Ambulatory Visit: Payer: Self-pay | Admitting: Surgery

## 2023-02-03 ENCOUNTER — Ambulatory Visit: Payer: Medicare Other

## 2023-02-03 DIAGNOSIS — M12811 Other specific arthropathies, not elsewhere classified, right shoulder: Secondary | ICD-10-CM

## 2023-02-03 DIAGNOSIS — M7581 Other shoulder lesions, right shoulder: Secondary | ICD-10-CM

## 2023-02-03 DIAGNOSIS — S46011A Strain of muscle(s) and tendon(s) of the rotator cuff of right shoulder, initial encounter: Secondary | ICD-10-CM

## 2023-02-03 NOTE — Telephone Encounter (Signed)
Patient and wife confirmed appt for 02/09/2023

## 2023-02-03 NOTE — Telephone Encounter (Signed)
There is mention of weakness, please schedule with MD.

## 2023-02-03 NOTE — Telephone Encounter (Signed)
MRI lumbar 02/02/2023 FINDINGS:  Heterogenous marrow signal without suspicious osseous lesion. L4 vertebral body hemangioma. The visualized cord is unremarkable and the conus medullaris ends at a normal level. 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   Multilevel degenerative disc and facet disease with endplate degenerative changes. Grade 1 retrolisthesis of L1 on L2 and L2 on L3 and grade 1 anterolisthesis of L3-4. There is complete disc collapse at the L1-L2 and L4-L5 levels. The combination of broad-based disc bulges, facet hypertrophy, and ligamentum flavum thickening contribute to mild spinal canal stenosis at L1-L2, L2-L3, L4-L5 and L5-S1, and moderate canal stenosis at L3-L4, and multilevel neuroforaminal stenosis which is moderate bilaterally at L2-L3 and moderate on the left at L3-L4.   At L2-L3, there is an asymmetric predominantly right-sided disc bulge, which displaces the extraforaminal right L2 nerve root, and a large right subarticular disc extrusion with caudal migration, which compresses the exiting right L3 nerve root (5:105).   At L3-L4, asymmetric disc bulge and left foraminal disc protrusion cause moderate spinal canal stenosis and compression of the left extraforaminal L3 nerve root.   The paraspinal tissues are within normal limits.   For the purposes of this dictation, the lowest well formed intervertebral disc space is assumed to be the L5-S1 level, and there are presumed to be five lumbar-type vertebral bodies.    Wife calling that he was taken to [facility] ER and would like to see neurosurgery in [place].  MD or PA?

## 2023-02-08 ENCOUNTER — Inpatient Hospital Stay: Payer: Medicare Other

## 2023-02-08 ENCOUNTER — Inpatient Hospital Stay: Payer: Medicare Other | Attending: Oncology

## 2023-02-08 DIAGNOSIS — Z85820 Personal history of malignant melanoma of skin: Secondary | ICD-10-CM | POA: Insufficient documentation

## 2023-02-08 DIAGNOSIS — D473 Essential (hemorrhagic) thrombocythemia: Secondary | ICD-10-CM | POA: Insufficient documentation

## 2023-02-08 DIAGNOSIS — D649 Anemia, unspecified: Secondary | ICD-10-CM

## 2023-02-08 DIAGNOSIS — D7581 Myelofibrosis: Secondary | ICD-10-CM

## 2023-02-08 DIAGNOSIS — D631 Anemia in chronic kidney disease: Secondary | ICD-10-CM | POA: Diagnosis not present

## 2023-02-08 DIAGNOSIS — N184 Chronic kidney disease, stage 4 (severe): Secondary | ICD-10-CM | POA: Insufficient documentation

## 2023-02-08 DIAGNOSIS — N183 Chronic kidney disease, stage 3 unspecified: Secondary | ICD-10-CM

## 2023-02-08 DIAGNOSIS — I129 Hypertensive chronic kidney disease with stage 1 through stage 4 chronic kidney disease, or unspecified chronic kidney disease: Secondary | ICD-10-CM | POA: Diagnosis not present

## 2023-02-08 LAB — HEMOGLOBIN AND HEMATOCRIT (CANCER CENTER ONLY)
HCT: 34.2 % — ABNORMAL LOW (ref 39.0–52.0)
Hemoglobin: 10.3 g/dL — ABNORMAL LOW (ref 13.0–17.0)

## 2023-02-08 NOTE — Progress Notes (Unsigned)
Referring Physician:  No referring provider defined for this encounter.  Primary Physician:  [name], DO  History of Present Illness: 02/09/2023 [name] is here today with a chief complaint of acute onset of back pain with some leg pain approximate 1 to 2 weeks ago.  He had severe pain in his right buttock and his anterior thigh and into his groin.  He had some trouble walking and has started using a cane for his balance.  His pain has substantially improved over the past several days.  Walking and standing make his pain worse.  Sitting makes it better.  He has had some instability with his right leg, but he is walking.  Currently 80% of his pain is in his back and 20% is in his right anterior thigh.   Bowel/Bladder Dysfunction: none  Conservative measures:  Physical therapy:  has not participated  Multimodal medical therapy including regular antiinflammatories:  robaxin, gabapentin  Injections:  has not received any epidural steroid injections  Past Surgery:   Lumbar Surgery about 25 years ago  [name] has no symptoms of cervical myelopathy.  The symptoms are causing a significant impact on the patient's life.   I have utilized the care everywhere function in epic to review the outside records available from external health systems.  Review of Systems:  A 10 point review of systems is negative, except for the pertinent positives and negatives detailed in the HPI.  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   right forearm, [facility]   Stroke (HCC) 11/2007   R brain subcortical  infarct    Past Surgical History: Past Surgical History:  Procedure Laterality Date   ASPIRATION / INJECTION RENAL CYST  Feb 2017   BACK SURGERY     approx 20- 25 years ago   GAS INSERTION  08/11/2011   Procedure: INSERTION OF GAS;  Surgeon: Sherrie George, MD;  Location: [facility] OR;  Service: Ophthalmology;  Laterality: Right;  [id number]   SCLERAL BUCKLE  08/11/2011   Procedure: SCLERAL BUCKLE;  Surgeon: Sherrie George, MD;  Location: [facility] OR;  Service: Ophthalmology;  Laterality: Right;   VARICOSE VEIN SURGERY      Allergies: Allergies as of 02/09/2023 - Review Complete 02/09/2023  Allergen Reaction Noted   Meloxicam Nausea And Vomiting 11/20/2014    Medications:  Current Outpatient Medications:    albuterol (VENTOLIN HFA) 108 (90 Base) MCG/ACT inhaler, Inhale 2 puffs into the lungs every 6 (six) hours as needed for wheezing or shortness of breath., Disp: 8 g, Rfl: 12   amLODipine (NORVASC) 10 MG tablet, Take 1 tablet (10 mg total) by mouth every evening., Disp: 90 tablet, Rfl: 1   azelastine (ASTELIN) 0.1 % nasal spray, Place 1 spray into both nostrils 2 (two) times daily. Use in each nostril as directed, Disp: 30 mL, Rfl: 0   calcium carbonate (OSCAL) 1500 (600 Ca) MG TABS tablet, Take by mouth., Disp: , Rfl:    Cholecalciferol (D3-1000 PO), Take by mouth daily., Disp: , Rfl:  clopidogrel (PLAVIX) 75 MG tablet, Take 1 tablet (75 mg total) by mouth daily., Disp: 90 tablet, Rfl: 1   DULoxetine (CYMBALTA) 20 MG capsule, TAKE 1 CAPSULE BY MOUTH DAILY., Disp: 90 capsule, Rfl: 1   gabapentin (NEURONTIN) 400 MG capsule, Take 1 tab in the AM and 2 tabs at bedtime., Disp: 270 capsule, Rfl: 1   Ginsengs-Saw Palmetto (MULTI GINSENG & SAW PALMETTO) 500 MG CAPS, Take 500 mg by mouth daily., Disp: , Rfl:    levothyroxine (SYNTHROID) 25 MCG tablet, TAKE 1 TABLET EVERY DAY ON EMPTY STOMACHWITH A GLASS OF WATER AT LEAST 30-60 MINBEFORE BREAKFAST, Disp: 90 tablet, Rfl: 0   losartan (COZAAR) 100 MG tablet,  Take 1 tablet (100 mg total) by mouth every morning., Disp: 90 tablet, Rfl: 1   meclizine (ANTIVERT) 25 MG tablet, TAKE 1 TABLET BY MOUTH 3 TIMES DAILY AS NEEDED FOR DIZZINESS, Disp: 90 tablet, Rfl: 6   Multiple Vitamin (MULITIVITAMIN WITH MINERALS) TABS, Take 1 tablet by mouth daily., Disp: , Rfl:    Naproxen Sodium 220 MG CAPS, Take by mouth as needed., Disp: , Rfl:    omeprazole (PRILOSEC) 40 MG capsule, Take 1 capsule (40 mg total) by mouth daily., Disp: 90 capsule, Rfl: 1   patiromer (VELTASSA) 8.4 g packet, Take 1 packet (8.4 g total) by mouth daily., Disp: 5 each, Rfl: 0   predniSONE (DELTASONE) 50 MG tablet, Take 1 tablet (50 mg total) by mouth daily with breakfast., Disp: 5 tablet, Rfl: 0   simvastatin (ZOCOR) 40 MG tablet, Take 1 tablet (40 mg total) by mouth daily., Disp: 90 tablet, Rfl: 1   tadalafil (CIALIS) 5 MG tablet, Take 1 tablet (5 mg total) by mouth daily., Disp: 90 tablet, Rfl: 3   tamsulosin (FLOMAX) 0.4 MG CAPS capsule, Take 2 capsules (0.8 mg total) by mouth daily., Disp: 180 capsule, Rfl: 1   triamcinolone cream (KENALOG) 0.1 %, Apply to affected skin rash on buttock twice a day for 5 days, each week prn, Disp: 80 g, Rfl: 3   zinc gluconate 50 MG tablet, Take by mouth., Disp: , Rfl:  No current facility-administered medications for this visit.  Facility-Administered Medications Ordered in Other Visits:    epoetin alfa-epbx (RETACRIT) injection 10,000 Units, 10,000 Units, Subcutaneous, Once, Creig Hines, MD   epoetin alfa-epbx (RETACRIT) injection 40,000 Units, 40,000 Units, Subcutaneous, Once, Creig Hines, MD  Social History: Social History   Tobacco Use   Smoking status: Former    Current packs/day: 0.00    Types: Cigarettes    Quit date: 06/06/2010    Years since quitting: 12.6   Smokeless tobacco: Never  Vaping Use   Vaping status: Never Used  Substance Use Topics   Alcohol use: No    Alcohol/week: 0.0 standard drinks of alcohol   Drug use: No     Family Medical History: Family History  Problem Relation Age of Onset   Heart disease Father    Diabetes Son    Kidney disease Neg Hx    Prostate cancer Neg Hx     Physical Examination: Vitals:   02/09/23 0957  BP: 130/72    General: Patient is in no apparent distress. Attention to examination is appropriate.  Neck:   Supple.  Full range of motion.  Respiratory: Patient is breathing without any difficulty.   NEUROLOGICAL:     Awake, alert, oriented to person, place, and time.  Speech is clear and fluent.   Cranial Nerves: Pupils equal round and  reactive to light.  Facial tone is symmetric.  Facial sensation is symmetric. Shoulder shrug is symmetric. Tongue protrusion is midline.  There is no pronator drift.  Strength: Side Biceps Triceps Deltoid Interossei Grip Wrist Ext. Wrist Flex.  R 5 5 5 5 5 5 5   L 5 5 5 5 5 5 5    Side Iliopsoas Quads Hamstring PF DF EHL  R 4 4+ 5 5 5 5   L 5 5 5 5 5 5    Reflexes are 1+ and symmetric at the biceps, triceps, brachioradialis, patella and achilles.   Hoffman's is absent.   Bilateral upper and lower extremity sensation is intact to light touch.    No evidence of dysmetria noted.  Gait is abnormal-uses cane.     Medical Decision Making  Imaging: MRI L spine 02/02/2023 - At L2-L3, asymmetric predominantly right-sided disc bulge displaces the extraforaminal right L2 nerve root, and a large right subarticular disc extrusion with caudal migration compresses the exiting right L3 nerve root   - At L3-L4, asymmetric disc bulge and left foraminal disc protrusion cause moderate spinal canal stenosis and compression of the left extraforaminal L3 nerve root.   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   I have personally reviewed the images and agree with the above interpretation.  Assessment and  Plan: Mr. Beerman is a pleasant 82 y.o. male with acute onset of back pain with right-sided sciatica approximately 2 weeks ago.  He has had substantial improvement with use of NSAIDs.  Given his improvement, I have recommended that he continue with NSAIDs for now.  I think he will likely fully recover.  I recommended that he watch his activity level for now to prevent any exacerbation.  If he gets any worse, we will send him for physical therapy and injections.  I will see him back in 6 weeks for follow-up.    Thank you for involving me in the care of this patient.      Thurley Francesconi K. Myer Haff MD, [facility] Neurosurgery

## 2023-02-08 NOTE — Progress Notes (Signed)
Patient HGB is 10.3 today, Retacrit not given.

## 2023-02-09 ENCOUNTER — Other Ambulatory Visit: Payer: Self-pay | Admitting: Family Medicine

## 2023-02-09 ENCOUNTER — Ambulatory Visit
Admission: RE | Admit: 2023-02-09 | Discharge: 2023-02-09 | Disposition: A | Discharge status: Home / Self Care | Payer: Self-pay | Source: Ambulatory Visit | Attending: Neurosurgery | Admitting: Neurosurgery

## 2023-02-09 ENCOUNTER — Ambulatory Visit (INDEPENDENT_AMBULATORY_CARE_PROVIDER_SITE_OTHER): Payer: Medicare Other | Admitting: Neurosurgery

## 2023-02-09 ENCOUNTER — Encounter: Payer: Self-pay | Admitting: Neurosurgery

## 2023-02-09 VITALS — BP 130/72 | Ht 69.0 in | Wt 179.8 lb

## 2023-02-09 DIAGNOSIS — M5441 Lumbago with sciatica, right side: Secondary | ICD-10-CM | POA: Diagnosis not present

## 2023-02-09 DIAGNOSIS — G8929 Other chronic pain: Secondary | ICD-10-CM

## 2023-02-09 DIAGNOSIS — Z049 Encounter for examination and observation for unspecified reason: Secondary | ICD-10-CM

## 2023-02-10 NOTE — Telephone Encounter (Signed)
Requested medication (s) are due for refill today: Yes  Requested medication (s) are on the active medication list: Yes  Last refill:  01/05/22  Future visit scheduled: No  Notes to clinic:  Not delegated.    Requested Prescriptions  Pending Prescriptions Disp Refills   meclizine (ANTIVERT) 25 MG tablet [Pharmacy Med Name: MECLIZINE HCL 25 MG TAB] 90 tablet 6    Sig: TAKE 1 TABLET BY MOUTH THREE TIMES DAILYAS NEEDED FOR DIZZINESS     Not Delegated - Gastroenterology: Antiemetics Failed - 02/09/2023 11:31 AM      Failed - This refill cannot be delegated      Passed - Valid encounter within last 6 months    Recent Outpatient Visits           1 week ago Weakness of left leg   [place] [facility] [place], Sherran Needs, NP   2 months ago Upper respiratory tract infection, unspecified type   [place] [facility] [place], Megan P, DO   3 months ago Hyperkalemia   [facility] [place], Megan P, DO   5 months ago Encounter for [employer] annual wellness exam   [place] [facility] [place], Megan P, DO   8 months ago Viral upper respiratory tract infection   [place] [facility] [place], Oswaldo Conroy, PA-C       Future Appointments             In 1 month Deirdre Evener, MD [facility]   In 1 month Laural Benes, Oralia Rud, DO [place] [facility]

## 2023-02-13 ENCOUNTER — Telehealth: Payer: Self-pay | Admitting: Neurosurgery

## 2023-02-13 DIAGNOSIS — G8929 Other chronic pain: Secondary | ICD-10-CM

## 2023-02-13 NOTE — Telephone Encounter (Signed)
He will try injections. He can not do physical therapy because he is not very mobile.

## 2023-02-13 NOTE — Telephone Encounter (Signed)
Per Dr. Zoila Shutter last note:  "If he gets any worse, we will send him for physical therapy and injections"   Would he like those referrals to be placed? Does he want me to send a message to Dr. Jeannie Fend about medication?

## 2023-02-13 NOTE — Telephone Encounter (Signed)
Patient's wife has called stating that her husband is still having a lot of pain while trying to wait it out. He is taking gabapentin and tylenol as needed but wants to know what else to do . Please advise

## 2023-02-15 ENCOUNTER — Inpatient Hospital Stay: Payer: Medicare Other

## 2023-02-15 ENCOUNTER — Ambulatory Visit: Admission: RE | Admit: 2023-02-15 | Payer: Medicare Other | Source: Ambulatory Visit

## 2023-02-15 DIAGNOSIS — D631 Anemia in chronic kidney disease: Secondary | ICD-10-CM | POA: Diagnosis not present

## 2023-02-15 DIAGNOSIS — D473 Essential (hemorrhagic) thrombocythemia: Secondary | ICD-10-CM | POA: Diagnosis not present

## 2023-02-15 DIAGNOSIS — D7581 Myelofibrosis: Secondary | ICD-10-CM | POA: Diagnosis not present

## 2023-02-15 DIAGNOSIS — D649 Anemia, unspecified: Secondary | ICD-10-CM

## 2023-02-15 DIAGNOSIS — I129 Hypertensive chronic kidney disease with stage 1 through stage 4 chronic kidney disease, or unspecified chronic kidney disease: Secondary | ICD-10-CM | POA: Diagnosis not present

## 2023-02-15 DIAGNOSIS — N184 Chronic kidney disease, stage 4 (severe): Secondary | ICD-10-CM | POA: Diagnosis not present

## 2023-02-15 DIAGNOSIS — Z85820 Personal history of malignant melanoma of skin: Secondary | ICD-10-CM | POA: Diagnosis not present

## 2023-02-15 LAB — HEMOGLOBIN AND HEMATOCRIT (CANCER CENTER ONLY)
HCT: 35.3 % — ABNORMAL LOW (ref 39.0–52.0)
Hemoglobin: 10.6 g/dL — ABNORMAL LOW (ref 13.0–17.0)

## 2023-02-15 NOTE — Telephone Encounter (Signed)
Referral has been placed for PMR.

## 2023-02-16 ENCOUNTER — Other Ambulatory Visit: Payer: Self-pay | Admitting: Family Medicine

## 2023-02-16 ENCOUNTER — Telehealth: Payer: Self-pay | Admitting: Neurosurgery

## 2023-02-16 MED ORDER — METHYLPREDNISOLONE 4 MG PO TBPK: ORAL_TABLET | ORAL | 0 refills | Status: DC

## 2023-02-16 NOTE — Telephone Encounter (Addendum)
I spoke to patient and he states he was doing well until last night and he was in so much pain he was unable to get comfortable. He has been taking Gabapentin and Tylenol and they have been working but in the last day he has been in severe pain. Per Dr. Myer Haff he is to take a medrol dose pack. Patient is pleased.

## 2023-02-16 NOTE — Telephone Encounter (Signed)
Patients wife has called stating that physiatry does not have any available appointments until Monday. They did decide to take the appointment but would like to know if the patient could get a pain medication to hold him over in the mean time, please advise?

## 2023-02-16 NOTE — Telephone Encounter (Signed)
Appt 02/20/2023

## 2023-02-17 NOTE — Telephone Encounter (Signed)
Requested Prescriptions  Pending Prescriptions Disp Refills   clopidogrel (PLAVIX) 75 MG tablet [Pharmacy Med Name: CLOPIDOGREL BISULFATE 75 MG TAB] 90 tablet 0    Sig: TAKE ONE TABLET BY MOUTH EVERY DAY     Hematology: Antiplatelets - clopidogrel Failed - 02/16/2023  2:14 PM      Failed - HCT in normal range and within 180 days    HCT  Date Value Ref Range Status  02/15/2023 35.3 (L) 39.0 - 52.0 % Final    Comment:    Performed at [facility], [address] [place], [place] [address]   Hematocrit  Date Value Ref Range Status  09/13/2022 28.6 (L) 37.5 - 51.0 % Final         Failed - HGB in normal range and within 180 days    Hemoglobin  Date Value Ref Range Status  02/15/2023 10.6 (L) 13.0 - 17.0 g/dL Final  60/45/4098 9.0 (L) 13.0 - 17.7 g/dL Final         Failed - Cr in normal range and within 360 days    Creatinine  Date Value Ref Range Status  10/19/2022 2.08 (H) 0.61 - 1.24 mg/dL Final  11/91/4782 9.56 0.60 - 1.30 mg/dL Final   Creatinine, Ser  Date Value Ref Range Status  01/25/2023 1.91 (H) 0.61 - 1.24 mg/dL Final         Passed - PLT in normal range and within 180 days    Platelets  Date Value Ref Range Status  01/25/2023 171 150 - 400 K/uL Final  09/13/2022 220 150 - 450 x10E3/uL Final    Comment:    Platelet count verified by examination of peripheral blood smear.   Platelet Count  Date Value Ref Range Status  10/19/2022 188 150 - 400 K/uL Final         Passed - Valid encounter within last 6 months    Recent Outpatient Visits           2 weeks ago Weakness of left leg   [facility] [place], Sherran Needs, NP   3 months ago Upper respiratory tract infection, unspecified type   [place] [facility] [place], Megan P, DO   3 months ago Hyperkalemia   [place] [facility] [place], Megan P, DO   5 months ago Encounter for [employer] annual wellness exam   [place] [facility] [place], Megan P, DO   8 months ago Viral upper respiratory tract infection   [place] [facility] [place], Oswaldo Conroy, PA-C       Future Appointments             In 1 month Deirdre Evener, MD [facility]   In 1 month Laural Benes, Oralia Rud, DO [place], [facility]

## 2023-02-21 ENCOUNTER — Ambulatory Visit (INDEPENDENT_AMBULATORY_CARE_PROVIDER_SITE_OTHER): Payer: Medicare Other | Admitting: Family Medicine

## 2023-02-21 VITALS — BP 124/74 | HR 71 | Temp 97.9°F | Wt 170.8 lb

## 2023-02-21 DIAGNOSIS — R197 Diarrhea, unspecified: Secondary | ICD-10-CM

## 2023-02-21 NOTE — Progress Notes (Unsigned)
BP 124/74   Pulse 71   Temp 97.9 F (36.6 C) (Oral)   Wt 170 lb 12.8 oz (77.5 kg)   SpO2 98%   BMI 25.22 kg/m    Subjective:    Patient ID: [name], male    DOB: 08-29-1940, 82 y.o.   MRN: [id number]  HPI: [name] is a 82 y.o. male  Chief Complaint  Patient presents with  . GI Problem    Pt states that she saw blood in his stool yesterday and patient has been going to the bathroom frequently    GASTROENTERITIS Duration: couple of months- has gotten much worse Diarrhea: yes bloody  Episodes of diarrhea/day: 4-5+x a day Nausea: yes Vomiting: no Abdominal pain: no Fever: no Decreased appetite: yes Tolerating liquids: no Foreign travel: no Relevant dietary history: N/A Similar illness in contacts: no Recent antibiotic use: no Status: worse Treatments attempted: kayopectate  Relevant past medical, surgical, family and social history reviewed and updated as indicated. Interim medical history since our last visit reviewed. Allergies and medications reviewed and updated.  Review of Systems  Per HPI unless specifically indicated above     Objective:    BP 124/74   Pulse 71   Temp 97.9 F (36.6 C) (Oral)   Wt 170 lb 12.8 oz (77.5 kg)   SpO2 98%   BMI 25.22 kg/m   Wt Readings from Last 3 Encounters:  02/21/23 170 lb 12.8 oz (77.5 kg)  02/09/23 179 lb 12.8 oz (81.6 kg)  02/02/23 177 lb 9.6 oz (80.6 kg)    Physical Exam  Results for orders placed or performed in visit on 02/15/23  Hemoglobin and Hematocrit (Cancer Center Only)  Result Value Ref Range   Hemoglobin 10.6 (L) 13.0 - 17.0 g/dL   HCT 40.9 (L) 81.1 - 91.4 %      Assessment & Plan:   Problem List Items Addressed This Visit   None    Follow up plan: No follow-ups on file.

## 2023-02-22 ENCOUNTER — Other Ambulatory Visit: Payer: Self-pay | Admitting: Oncology

## 2023-02-22 ENCOUNTER — Inpatient Hospital Stay: Payer: Medicare Other

## 2023-02-22 ENCOUNTER — Encounter: Payer: Self-pay | Admitting: Family Medicine

## 2023-02-22 ENCOUNTER — Inpatient Hospital Stay (HOSPITAL_BASED_OUTPATIENT_CLINIC_OR_DEPARTMENT_OTHER): Payer: Medicare Other | Admitting: Oncology

## 2023-02-22 ENCOUNTER — Encounter: Payer: Self-pay | Admitting: Oncology

## 2023-02-22 VITALS — BP 142/72 | HR 74 | Temp 97.6°F | Resp 18 | Ht 70.0 in | Wt 173.0 lb

## 2023-02-22 DIAGNOSIS — N183 Chronic kidney disease, stage 3 unspecified: Secondary | ICD-10-CM | POA: Diagnosis not present

## 2023-02-22 DIAGNOSIS — D631 Anemia in chronic kidney disease: Secondary | ICD-10-CM | POA: Diagnosis not present

## 2023-02-22 DIAGNOSIS — N184 Chronic kidney disease, stage 4 (severe): Secondary | ICD-10-CM | POA: Diagnosis not present

## 2023-02-22 DIAGNOSIS — D649 Anemia, unspecified: Secondary | ICD-10-CM

## 2023-02-22 DIAGNOSIS — Z79899 Other long term (current) drug therapy: Secondary | ICD-10-CM | POA: Diagnosis not present

## 2023-02-22 DIAGNOSIS — I129 Hypertensive chronic kidney disease with stage 1 through stage 4 chronic kidney disease, or unspecified chronic kidney disease: Secondary | ICD-10-CM | POA: Diagnosis not present

## 2023-02-22 DIAGNOSIS — D473 Essential (hemorrhagic) thrombocythemia: Secondary | ICD-10-CM | POA: Diagnosis not present

## 2023-02-22 DIAGNOSIS — D7581 Myelofibrosis: Secondary | ICD-10-CM

## 2023-02-22 DIAGNOSIS — R197 Diarrhea, unspecified: Secondary | ICD-10-CM | POA: Diagnosis not present

## 2023-02-22 DIAGNOSIS — Z85820 Personal history of malignant melanoma of skin: Secondary | ICD-10-CM | POA: Diagnosis not present

## 2023-02-22 LAB — HEMOGLOBIN AND HEMATOCRIT (CANCER CENTER ONLY)
HCT: 36.6 % — ABNORMAL LOW (ref 39.0–52.0)
Hemoglobin: 11.2 g/dL — ABNORMAL LOW (ref 13.0–17.0)

## 2023-02-22 NOTE — Progress Notes (Signed)
Hematology/Oncology Consult note [facility]  Telephone:([phone] [phone]  Patient Care Team: [name], DO as PCP - General (Family Medicine) Jolayne Panther, MD as Referring Physician (Hematology) Sherrie George, MD as Consulting Physician (Ophthalmology) Micki Riley, MD as Consulting Physician (Neurology) Jerilee Field, MD as Consulting Physician (Urology) Antonieta Iba, MD as Consulting Physician (Cardiology) Riki Altes, MD (Urology) Creig Hines, MD as Consulting Physician (Hematology and Oncology)   Name of the patient: [name]  [id number]  08-26-1940   Date of visit: 02/22/23  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  At that time he was found to have a mild splenomegaly of 15.8 cm.DIPPS score is 80 (age 4- 1, hemoglobin less than 10- 2) and score of 4 if 1% circulating blasts included from 07/17/2014.   Bone marrow on 06/11/2014 was most consistent with primary myelofibrosis.  Bone marrow biopsy showed 1% abnormal cells: CD45+, CD5+, CD10, CD11c+/-, CD19+, CD2-+, (dim), CD22+ (dim, CD23+, CD38-/+, FMC7-, HLA-DR+, sig lambda+(dim).  Blasts were not increased 1.2%; hypercellular for age: 54%; JAK2 V617F mutation was negative.  CALR mutation positive.  Flow cytometry included about 1% CLL/SLL phenotype cells (CD5+) of uncertain significance and some infiltrate into the marrow with increased atypical megakaryocytes.  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  MDS FISH panel was negative.   Patient also follows up with [facility] benign hematology Dr. Ester Rink for his anemia.  Patient's hemoglobin was drifting down to the eights and was started on EPO in  October 2022.    Interval history-patient is here with his wife and son today.  Overall he is feeling well and denies any specific complaints at this time.  Tolerating Depo well.  Denies any abdominal pain or early satiety nausea vomiting  ECOG PS- 1 Pain scale- 0   Review of systems- Review of Systems  Constitutional:  Negative for chills, fever, malaise/fatigue and weight loss.  HENT:  Negative for congestion, ear discharge and nosebleeds.   Eyes:  Negative for blurred vision.  Respiratory:  Negative for cough, hemoptysis, sputum production, shortness of breath and wheezing.   Cardiovascular:  Negative for chest pain, palpitations, orthopnea and claudication.  Gastrointestinal:  Negative for abdominal pain, blood in stool, constipation, diarrhea, heartburn, melena, nausea and vomiting.  Genitourinary:  Negative for dysuria, flank pain, frequency, hematuria and urgency.  Musculoskeletal:  Negative for back pain, joint pain and myalgias.  Skin:  Negative for rash.  Neurological:  Negative for dizziness, tingling, focal weakness, seizures, weakness and headaches.  Endo/Heme/Allergies:  Does not bruise/bleed easily.  Psychiatric/Behavioral:  Negative for depression and suicidal ideas. The patient does not have insomnia.       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   right forearm, [facility]  Stroke [facility]) 11/2007   R brain subcortical infarct     Past Surgical History:  Procedure Laterality Date   ASPIRATION / INJECTION RENAL CYST  Feb 2017   BACK SURGERY      approx 20- 25 years ago   GAS INSERTION  08/11/2011   Procedure: INSERTION OF GAS;  Surgeon: Sherrie George, MD;  Location: [facility] OR;  Service: Ophthalmology;  Laterality: Right;  [id number]   SCLERAL BUCKLE  08/11/2011   Procedure: SCLERAL BUCKLE;  Surgeon: Sherrie George, MD;  Location: [facility] OR;  Service: Ophthalmology;  Laterality: Right;   VARICOSE VEIN SURGERY      Social History   Socioeconomic History   Marital status: Married    Spouse name: [name]    Number of children: 2   Years of education: 12+   Highest education level: Some college, no degree  Occupational History   Occupation: Fish farm manager: OTHER    Comment: community   Occupation: SELF EMPLOYED    Employer: SELF EMPLOYED  Tobacco Use   Smoking status: Former    Current packs/day: 0.00    Types: Cigarettes    Quit date: 06/06/2010    Years since quitting: 12.7   Smokeless tobacco: Never  Vaping Use   Vaping status: Never Used  Substance and Sexual Activity   Alcohol use: No    Alcohol/week: 0.0 standard drinks of alcohol   Drug use: No   Sexual activity: Not Currently  Other Topics Concern   Not on file  Social History Narrative   Pt lives at home with his family.   Caffeine Use- 2 cups daily   Patient has 2 children.    Patient has some college.    Patient is right handed.          Works full time   Chemical engineer Strain: Low Risk  (07/12/2021)   Overall Financial Resource Strain (CARDIA)    Difficulty of Paying Living Expenses: Not hard at all  Food Insecurity: No Food Insecurity (07/12/2021)   Hunger Vital Sign    Worried About Running Out of Food in the Last Year: Never true    Ran Out of Food in the Last Year: Never true  Transportation Needs: No Transportation Needs (07/12/2021)   PRAPARE - Administrator, Civil Service (Medical): No    Lack of Transportation (Non-Medical): No  Physical Activity: Inactive (07/12/2021)   Exercise Vital Sign    Days of  Exercise per Week: 0 days    Minutes of Exercise per Session: 0 min  Stress: No Stress Concern Present (07/12/2021)   [employer] of Occupational Health - Occupational Stress Questionnaire    Feeling of Stress : Not at all  Social Connections: Socially Integrated (07/12/2021)   Social Connection and Isolation Panel [NHANES]    Frequency of Communication with Friends and Family: Three times a week    Frequency of Social Gatherings with Friends and Family: More than three times a week    Attends Religious Services: More than 4 times per year    Active Member of [employer] or Organizations: Yes    Attends Engineer, structural: More than 4 times per year    Marital Status: Married  Catering manager Violence: Not At Risk (07/12/2021)   Humiliation, Afraid, Rape, and Kick questionnaire    Fear of Current or Ex-Partner: No    Emotionally Abused: No    Physically Abused: No  Sexually Abused: No    Family History  Problem Relation Age of Onset   Heart disease Father    Diabetes Son    Kidney disease Neg Hx    Prostate cancer Neg Hx      Current Outpatient Medications:    albuterol (VENTOLIN HFA) 108 (90 Base) MCG/ACT inhaler, Inhale 2 puffs into the lungs every 6 (six) hours as needed for wheezing or shortness of breath., Disp: 8 g, Rfl: 12   amLODipine (NORVASC) 10 MG tablet, Take 1 tablet (10 mg total) by mouth every evening., Disp: 90 tablet, Rfl: 1   azelastine (ASTELIN) 0.1 % nasal spray, Place 1 spray into both nostrils 2 (two) times daily. Use in each nostril as directed, Disp: 30 mL, Rfl: 0   calcium carbonate (OSCAL) 1500 (600 Ca) MG TABS tablet, Take by mouth., Disp: , Rfl:    Cholecalciferol (D3-1000 PO), Take by mouth daily., Disp: , Rfl:    clopidogrel (PLAVIX) 75 MG tablet, TAKE ONE TABLET BY MOUTH EVERY DAY, Disp: 90 tablet, Rfl: 0   DULoxetine (CYMBALTA) 20 MG capsule, TAKE 1 CAPSULE BY MOUTH DAILY., Disp: 90 capsule, Rfl: 1   gabapentin (NEURONTIN) 400 MG capsule,  Take 1 tab in the AM and 2 tabs at bedtime., Disp: 270 capsule, Rfl: 1   Ginsengs-Saw Palmetto (MULTI GINSENG & SAW PALMETTO) 500 MG CAPS, Take 500 mg by mouth daily., Disp: , Rfl:    levothyroxine (SYNTHROID) 25 MCG tablet, TAKE 1 TABLET EVERY DAY ON EMPTY STOMACHWITH A GLASS OF WATER AT LEAST 30-60 MINBEFORE BREAKFAST, Disp: 90 tablet, Rfl: 0   losartan (COZAAR) 100 MG tablet, Take 1 tablet (100 mg total) by mouth every morning., Disp: 90 tablet, Rfl: 1   meclizine (ANTIVERT) 25 MG tablet, TAKE 1 TABLET BY MOUTH THREE TIMES DAILYAS NEEDED FOR DIZZINESS, Disp: 90 tablet, Rfl: 6   methylPREDNISolone (MEDROL DOSEPAK) 4 MG TBPK tablet, Take by mouth daily, Taper daily dose per package instructions, Disp: 21 tablet, Rfl: 0   Multiple Vitamin (MULITIVITAMIN WITH MINERALS) TABS, Take 1 tablet by mouth daily., Disp: , Rfl:    Naproxen Sodium 220 MG CAPS, Take by mouth as needed., Disp: , Rfl:    omeprazole (PRILOSEC) 40 MG capsule, Take 1 capsule (40 mg total) by mouth daily., Disp: 90 capsule, Rfl: 1   patiromer (VELTASSA) 8.4 g packet, Take 1 packet (8.4 g total) by mouth daily., Disp: 5 each, Rfl: 0   predniSONE (DELTASONE) 50 MG tablet, Take 1 tablet (50 mg total) by mouth daily with breakfast., Disp: 5 tablet, Rfl: 0   simvastatin (ZOCOR) 40 MG tablet, Take 1 tablet (40 mg total) by mouth daily., Disp: 90 tablet, Rfl: 1   tadalafil (CIALIS) 5 MG tablet, Take 1 tablet (5 mg total) by mouth daily., Disp: 90 tablet, Rfl: 3   tamsulosin (FLOMAX) 0.4 MG CAPS capsule, Take 2 capsules (0.8 mg total) by mouth daily., Disp: 180 capsule, Rfl: 1   triamcinolone cream (KENALOG) 0.1 %, Apply to affected skin rash on buttock twice a day for 5 days, each week prn, Disp: 80 g, Rfl: 3   zinc gluconate 50 MG tablet, Take by mouth., Disp: , Rfl:  No current facility-administered medications for this visit.  Facility-Administered Medications Ordered in Other Visits:    epoetin alfa-epbx (RETACRIT) injection 10,000  Units, 10,000 Units, Subcutaneous, Once, Creig Hines, MD   epoetin alfa-epbx (RETACRIT) injection 40,000 Units, 40,000 Units, Subcutaneous, Once, Creig Hines, MD  Physical exam:  Vitals:   02/22/23 1007  BP: (!) 142/72  Pulse: 74  Resp: 18  Temp: 97.6 F (36.4 C)  TempSrc: Tympanic  SpO2: 97%  Weight: 173 lb (78.5 kg)  Height: 5\' 10"  (1.778 m)   Physical Exam Cardiovascular:     Rate and Rhythm: Normal rate and regular rhythm.     Heart sounds: Normal heart sounds.  Pulmonary:     Effort: Pulmonary effort is normal.  Skin:    General: Skin is warm and dry.  Neurological:     Mental Status: He is alert and oriented to person, place, and time.         Latest Ref Rng & Units 01/25/2023    9:43 AM  CMP  Glucose 70 - 99 mg/dL 875   BUN 8 - 23 mg/dL 34   Creatinine 6.43 - 1.24 mg/dL 3.29   Sodium 518 - 841 mmol/L 138   Potassium 3.5 - 5.1 mmol/L 4.6   Chloride 98 - 111 mmol/L 111   CO2 22 - 32 mmol/L 22   Calcium 8.9 - 10.3 mg/dL 8.1   Total Protein 6.5 - 8.1 g/dL 5.9   Total Bilirubin 0.3 - 1.2 mg/dL 0.7   Alkaline Phos 38 - 126 U/L 77   AST 15 - 41 U/L 36   ALT 0 - 44 U/L 27       Latest Ref Rng & Units 02/22/2023    9:40 AM  CBC  Hemoglobin 13.0 - 17.0 g/dL 66.0   Hematocrit 63.0 - 52.0 % 36.6     No images are attached to the encounter.  MR Abdomen W Wo Contrast  Result Date: 02/06/2023 CLINICAL DATA:  Characterize liver lesion identified by ultrasound EXAM: MRI ABDOMEN WITHOUT AND WITH CONTRAST TECHNIQUE: Multiplanar multisequence MR imaging of the abdomen was performed both before and after the administration of intravenous contrast. CONTRAST:  9mL GADAVIST GADOBUTROL 1 MMOL/ML IV SOLN COMPARISON:  Renal ultrasound, 12/05/2022 FINDINGS: Lower chest: No acute abnormality. Hepatobiliary: No solid liver abnormality is seen. Definitively benign flash filling hemangioma of the central right lobe of the liver measuring 2.7 x 2.3 cm (series 14, image 44). No  gallstones, gallbladder wall thickening, or biliary dilatation. Pancreas: Unremarkable. No pancreatic ductal dilatation or surrounding inflammatory changes. Spleen: Splenomegaly, maximum coronal span 19.6 cm. Adrenals/Urinary Tract: Adrenal glands are unremarkable. Numerous simple, fluid signal renal cortical cysts varying sizes, including a large, partially imaged cyst arising from the inferior pole of the right kidney measuring 13.6 cm (series 19, image 36). Kidneys are otherwise normal, without obvious renal calculi, solid lesion, or hydronephrosis. Stomach/Bowel: Stomach is within normal limits. No evidence of bowel wall thickening, distention, or inflammatory changes. Vascular/Lymphatic: Aortic atherosclerosis. No enlarged abdominal lymph nodes. Other: No abdominal wall hernia or abnormality. No ascites. Musculoskeletal: No acute or significant osseous findings. IMPRESSION: 1. Definitively benign flash filling hemangioma of the central right lobe of the liver measuring 2.7 x 2.3 cm. No further follow-up or characterization is required for this benign finding. 2. Splenomegaly, maximum coronal span 19.6 cm. 3. Numerous simple, fluid signal renal cortical cysts varying sizes, including a large, partially imaged cyst arising from the inferior pole of the right kidney measuring 13.6 cm. Although definitively benign, this may be symptomatic due to size and mass effect. No further follow-up or characterization is required for these benign findings. Aortic Atherosclerosis (ICD10-I70.0). Electronically Signed   By: Jearld Lesch M.D.   On: 02/06/2023 14:13     Assessment and plan-  Patient is a 82 y.o. male with history of primary myelofibrosis predominantly anemia currently on Retacrit.  He is is a routine follow-up visit  Patient's hemoglobin was transiently low around 8.4 a month ago but presently has improved to 11.2.  He will continue to receive weekly Retacrit 50,000 units but he will not be getting that  today.  Patient was noted to have a splenomegaly of 19 cm on his MRI.  He is asymptomatic at this time.  I will plan to repeat an ultrasound to assess his spleen size again in 6 months time.  If there is a continued increase in his spleen size I will consider switching him to [facility] at that time  MRI abdomen was done to evaluate liver lesion which showsBenign hemangioma measuring 2.7 x 2.3 cm.  This does not require any further follow-up   Visit Diagnosis 1. Anemia of chronic kidney failure, stage 3 (moderate) (HCC)   2. Myelofibrosis (HCC)   3. Erythropoietin (EPO) stimulating agent anemia management patient      Dr. Owens Shark, MD, MPH [facility] [phone] 02/22/2023 1:19 PM

## 2023-02-24 LAB — FECAL OCCULT BLOOD, IMMUNOCHEMICAL: Fecal Occult Bld: NEGATIVE

## 2023-02-27 DIAGNOSIS — M5416 Radiculopathy, lumbar region: Secondary | ICD-10-CM | POA: Diagnosis not present

## 2023-02-28 LAB — STOOL CULTURE: E coli, Shiga toxin Assay: NEGATIVE

## 2023-02-28 LAB — OVA AND PARASITE EXAMINATION

## 2023-02-28 LAB — FECAL LEUKOCYTES

## 2023-02-28 LAB — CLOSTRIDIUM DIFFICILE EIA: C difficile Toxins A+B, EIA: NEGATIVE

## 2023-03-01 ENCOUNTER — Other Ambulatory Visit: Payer: Self-pay | Admitting: Family Medicine

## 2023-03-01 ENCOUNTER — Inpatient Hospital Stay: Payer: Medicare Other

## 2023-03-01 VITALS — BP 137/66

## 2023-03-01 DIAGNOSIS — D631 Anemia in chronic kidney disease: Secondary | ICD-10-CM

## 2023-03-01 DIAGNOSIS — D7581 Myelofibrosis: Secondary | ICD-10-CM

## 2023-03-01 DIAGNOSIS — Z85820 Personal history of malignant melanoma of skin: Secondary | ICD-10-CM | POA: Diagnosis not present

## 2023-03-01 DIAGNOSIS — D473 Essential (hemorrhagic) thrombocythemia: Secondary | ICD-10-CM | POA: Diagnosis not present

## 2023-03-01 DIAGNOSIS — N184 Chronic kidney disease, stage 4 (severe): Secondary | ICD-10-CM | POA: Diagnosis not present

## 2023-03-01 DIAGNOSIS — I129 Hypertensive chronic kidney disease with stage 1 through stage 4 chronic kidney disease, or unspecified chronic kidney disease: Secondary | ICD-10-CM | POA: Diagnosis not present

## 2023-03-01 LAB — HEMOGLOBIN AND HEMATOCRIT (CANCER CENTER ONLY)
HCT: 30.4 % — ABNORMAL LOW (ref 39.0–52.0)
Hemoglobin: 9.2 g/dL — ABNORMAL LOW (ref 13.0–17.0)

## 2023-03-01 MED ORDER — EPOETIN ALFA-EPBX 20000 UNIT/ML IJ SOLN: 50000.0000 [IU] | INTRAMUSCULAR | Status: DC

## 2023-03-01 MED ORDER — EPOETIN ALFA-EPBX 10000 UNIT/ML IJ SOLN
10000.0000 [IU] | Freq: Once | INTRAMUSCULAR | Status: AC
  Administered 2023-03-01: 10000 [IU] via SUBCUTANEOUS
  Filled 2023-03-01: qty 1

## 2023-03-01 MED ORDER — EPOETIN ALFA-EPBX 40000 UNIT/ML IJ SOLN
40000.0000 [IU] | Freq: Once | INTRAMUSCULAR | Status: AC
  Administered 2023-03-01: 40000 [IU] via SUBCUTANEOUS
  Filled 2023-03-01: qty 1

## 2023-03-02 NOTE — Telephone Encounter (Signed)
Requested by interface surescripts. Future visit in 4 days.  Requested Prescriptions  Pending Prescriptions Disp Refills   gabapentin (NEURONTIN) 400 MG capsule [Pharmacy Med Name: GABAPENTIN 400 MG CAP] 270 capsule 0    Sig: TAKE 1 CAPSULE BY MOUTH EVERY MORNING AND TAKE 2 CAPSULES AT BEDTIME     Neurology: Anticonvulsants - gabapentin Failed - 03/01/2023 11:20 AM      Failed - Cr in normal range and within 360 days    Creatinine  Date Value Ref Range Status  10/19/2022 2.08 (H) 0.61 - 1.24 mg/dL Final  19/14/7829 5.62 0.60 - 1.30 mg/dL Final   Creatinine, Ser  Date Value Ref Range Status  01/25/2023 1.91 (H) 0.61 - 1.24 mg/dL Final         Passed - Completed PHQ-2 or PHQ-9 in the last 360 days      Passed - Valid encounter within last 12 months    Recent Outpatient Visits           1 week ago Diarrhea, unspecified type   [place] [facility] [place], Megan P, DO   4 weeks ago Weakness of left leg   [place] [facility] [place], Sherran Needs, NP   3 months ago Upper respiratory tract infection, unspecified type   [place] [facility] [place], Megan P, DO   4 months ago Hyperkalemia   [place] [facility] [place], Megan P, DO   5 months ago Encounter for [employer] annual wellness exam   [place] [facility] [place], Oralia Rud, DO       Future Appointments             In 4 days [name], DO [place] [facility], [facility]   In 3 weeks Deirdre Evener, MD [facility]   In 1 month [place], Oralia Rud, DO [facility], [facility]

## 2023-03-03 ENCOUNTER — Ambulatory Visit
Admission: RE | Admit: 2023-03-03 | Discharge: 2023-03-03 | Disposition: A | Discharge status: Home / Self Care | Payer: Medicare Other | Source: Ambulatory Visit | Attending: Surgery | Admitting: Surgery

## 2023-03-03 DIAGNOSIS — M12811 Other specific arthropathies, not elsewhere classified, right shoulder: Secondary | ICD-10-CM | POA: Diagnosis not present

## 2023-03-03 DIAGNOSIS — M7581 Other shoulder lesions, right shoulder: Secondary | ICD-10-CM | POA: Insufficient documentation

## 2023-03-03 DIAGNOSIS — M75101 Unspecified rotator cuff tear or rupture of right shoulder, not specified as traumatic: Secondary | ICD-10-CM | POA: Diagnosis not present

## 2023-03-03 DIAGNOSIS — Z01818 Encounter for other preprocedural examination: Secondary | ICD-10-CM | POA: Diagnosis not present

## 2023-03-03 DIAGNOSIS — S46011A Strain of muscle(s) and tendon(s) of the rotator cuff of right shoulder, initial encounter: Secondary | ICD-10-CM | POA: Insufficient documentation

## 2023-03-03 DIAGNOSIS — S46811A Strain of other muscles, fascia and tendons at shoulder and upper arm level, right arm, initial encounter: Secondary | ICD-10-CM | POA: Diagnosis not present

## 2023-03-06 ENCOUNTER — Ambulatory Visit: Payer: Medicare Other | Admitting: Family Medicine

## 2023-03-08 ENCOUNTER — Inpatient Hospital Stay: Payer: Medicare Other | Attending: Oncology

## 2023-03-08 ENCOUNTER — Inpatient Hospital Stay: Payer: Medicare Other

## 2023-03-08 VITALS — BP 111/65

## 2023-03-08 DIAGNOSIS — I129 Hypertensive chronic kidney disease with stage 1 through stage 4 chronic kidney disease, or unspecified chronic kidney disease: Secondary | ICD-10-CM | POA: Diagnosis not present

## 2023-03-08 DIAGNOSIS — M9901 Segmental and somatic dysfunction of cervical region: Secondary | ICD-10-CM | POA: Diagnosis not present

## 2023-03-08 DIAGNOSIS — D7581 Myelofibrosis: Secondary | ICD-10-CM

## 2023-03-08 DIAGNOSIS — N183 Chronic kidney disease, stage 3 unspecified: Secondary | ICD-10-CM

## 2023-03-08 DIAGNOSIS — D631 Anemia in chronic kidney disease: Secondary | ICD-10-CM

## 2023-03-08 DIAGNOSIS — M5033 Other cervical disc degeneration, cervicothoracic region: Secondary | ICD-10-CM | POA: Diagnosis not present

## 2023-03-08 DIAGNOSIS — N184 Chronic kidney disease, stage 4 (severe): Secondary | ICD-10-CM | POA: Insufficient documentation

## 2023-03-08 DIAGNOSIS — M5412 Radiculopathy, cervical region: Secondary | ICD-10-CM | POA: Diagnosis not present

## 2023-03-08 DIAGNOSIS — M9903 Segmental and somatic dysfunction of lumbar region: Secondary | ICD-10-CM | POA: Diagnosis not present

## 2023-03-08 LAB — HEMOGLOBIN AND HEMATOCRIT (CANCER CENTER ONLY)
HCT: 27.9 % — ABNORMAL LOW (ref 39.0–52.0)
Hemoglobin: 8.5 g/dL — ABNORMAL LOW (ref 13.0–17.0)

## 2023-03-08 MED ORDER — EPOETIN ALFA-EPBX 10000 UNIT/ML IJ SOLN
10000.0000 [IU] | Freq: Once | INTRAMUSCULAR | Status: AC
  Administered 2023-03-08: 10000 [IU] via SUBCUTANEOUS
  Filled 2023-03-08: qty 1

## 2023-03-08 MED ORDER — EPOETIN ALFA-EPBX 40000 UNIT/ML IJ SOLN
40000.0000 [IU] | Freq: Once | INTRAMUSCULAR | Status: AC
  Administered 2023-03-08: 40000 [IU] via SUBCUTANEOUS
  Filled 2023-03-08: qty 1

## 2023-03-15 ENCOUNTER — Inpatient Hospital Stay: Payer: Medicare Other

## 2023-03-20 ENCOUNTER — Other Ambulatory Visit: Payer: Self-pay | Admitting: Family Medicine

## 2023-03-20 MED ORDER — LIDOCAINE 5 % EX PTCH: 1.0000 | MEDICATED_PATCH | CUTANEOUS | 5 refills | Status: DC

## 2023-03-21 ENCOUNTER — Other Ambulatory Visit: Payer: Self-pay | Admitting: Family Medicine

## 2023-03-22 ENCOUNTER — Inpatient Hospital Stay: Payer: Medicare Other

## 2023-03-22 VITALS — BP 151/80

## 2023-03-22 DIAGNOSIS — D7581 Myelofibrosis: Secondary | ICD-10-CM

## 2023-03-22 DIAGNOSIS — I129 Hypertensive chronic kidney disease with stage 1 through stage 4 chronic kidney disease, or unspecified chronic kidney disease: Secondary | ICD-10-CM | POA: Diagnosis not present

## 2023-03-22 DIAGNOSIS — D631 Anemia in chronic kidney disease: Secondary | ICD-10-CM

## 2023-03-22 DIAGNOSIS — N184 Chronic kidney disease, stage 4 (severe): Secondary | ICD-10-CM | POA: Diagnosis not present

## 2023-03-22 DIAGNOSIS — D649 Anemia, unspecified: Secondary | ICD-10-CM

## 2023-03-22 LAB — HEMOGLOBIN AND HEMATOCRIT (CANCER CENTER ONLY)
HCT: 30.8 % — ABNORMAL LOW (ref 39.0–52.0)
Hemoglobin: 9.3 g/dL — ABNORMAL LOW (ref 13.0–17.0)

## 2023-03-22 MED ORDER — EPOETIN ALFA-EPBX 10000 UNIT/ML IJ SOLN
10000.0000 [IU] | Freq: Once | INTRAMUSCULAR | Status: AC
  Administered 2023-03-22: 10000 [IU] via SUBCUTANEOUS
  Filled 2023-03-22: qty 1

## 2023-03-22 MED ORDER — EPOETIN ALFA-EPBX 40000 UNIT/ML IJ SOLN
40000.0000 [IU] | Freq: Once | INTRAMUSCULAR | Status: AC
  Administered 2023-03-22: 40000 [IU] via SUBCUTANEOUS
  Filled 2023-03-22: qty 1

## 2023-03-22 NOTE — Telephone Encounter (Signed)
Requested Prescriptions  Pending Prescriptions Disp Refills   tamsulosin (FLOMAX) 0.4 MG CAPS capsule [Pharmacy Med Name: TAMSULOSIN HCL 0.4 MG CAP] 180 capsule 1    Sig: TAKE 2 CAPSULES BY MOUTH EVERY DAY     Urology: Alpha-Adrenergic Blocker Failed - 03/21/2023  2:33 PM      Failed - PSA in normal range and within 360 days    Prostate Specific Ag, Serum  Date Value Ref Range Status  10/07/2016 1.5 0.0 - 4.0 ng/mL Final    Comment:    Roche ECLIA methodology. According to the American Urological Association, Serum PSA should decrease and remain at undetectable levels after radical prostatectomy. The AUA defines biochemical recurrence as an initial PSA value 0.2 ng/mL or greater followed by a subsequent confirmatory PSA value 0.2 ng/mL or greater. Values obtained with different assay methods or kits cannot be used interchangeably. Results cannot be interpreted as absolute evidence of the presence or absence of malignant disease.          Passed - Last BP in normal range    BP Readings from Last 1 Encounters:  03/08/23 111/65         Passed - Valid encounter within last 12 months    Recent Outpatient Visits           4 weeks ago Diarrhea, unspecified type   [place] [facility] [place], Megan P, DO   1 month ago Weakness of left leg   [place] [facility] [place], Sherran Needs, NP   4 months ago Upper respiratory tract infection, unspecified type   [place] [facility] [place], Megan P, DO   4 months ago Hyperkalemia   [place] [facility] [place], Megan P, DO   6 months ago Encounter for [employer] annual wellness exam   [place] [facility] [name], DO       Future Appointments             In 1 week Deirdre Evener, MD [facility]   In 1 week [name], DO [place] [facility], [facility]

## 2023-03-23 ENCOUNTER — Ambulatory Visit: Payer: Medicare Other | Admitting: Neurosurgery

## 2023-03-29 ENCOUNTER — Inpatient Hospital Stay: Payer: Medicare Other

## 2023-03-29 ENCOUNTER — Ambulatory Visit: Payer: Medicare Other | Admitting: Dermatology

## 2023-03-29 ENCOUNTER — Encounter: Payer: Self-pay | Admitting: Dermatology

## 2023-03-29 VITALS — BP 137/68

## 2023-03-29 DIAGNOSIS — L209 Atopic dermatitis, unspecified: Secondary | ICD-10-CM | POA: Diagnosis not present

## 2023-03-29 DIAGNOSIS — D7581 Myelofibrosis: Secondary | ICD-10-CM

## 2023-03-29 DIAGNOSIS — L820 Inflamed seborrheic keratosis: Secondary | ICD-10-CM

## 2023-03-29 DIAGNOSIS — D229 Melanocytic nevi, unspecified: Secondary | ICD-10-CM

## 2023-03-29 DIAGNOSIS — L821 Other seborrheic keratosis: Secondary | ICD-10-CM

## 2023-03-29 DIAGNOSIS — Z85828 Personal history of other malignant neoplasm of skin: Secondary | ICD-10-CM

## 2023-03-29 DIAGNOSIS — W908XXA Exposure to other nonionizing radiation, initial encounter: Secondary | ICD-10-CM

## 2023-03-29 DIAGNOSIS — Z85820 Personal history of malignant melanoma of skin: Secondary | ICD-10-CM | POA: Diagnosis not present

## 2023-03-29 DIAGNOSIS — N184 Chronic kidney disease, stage 4 (severe): Secondary | ICD-10-CM | POA: Diagnosis not present

## 2023-03-29 DIAGNOSIS — B354 Tinea corporis: Secondary | ICD-10-CM | POA: Diagnosis not present

## 2023-03-29 DIAGNOSIS — I129 Hypertensive chronic kidney disease with stage 1 through stage 4 chronic kidney disease, or unspecified chronic kidney disease: Secondary | ICD-10-CM | POA: Diagnosis not present

## 2023-03-29 DIAGNOSIS — B359 Dermatophytosis, unspecified: Secondary | ICD-10-CM | POA: Diagnosis not present

## 2023-03-29 DIAGNOSIS — R21 Rash and other nonspecific skin eruption: Secondary | ICD-10-CM | POA: Diagnosis not present

## 2023-03-29 DIAGNOSIS — Z79899 Other long term (current) drug therapy: Secondary | ICD-10-CM

## 2023-03-29 DIAGNOSIS — L578 Other skin changes due to chronic exposure to nonionizing radiation: Secondary | ICD-10-CM | POA: Diagnosis not present

## 2023-03-29 DIAGNOSIS — L570 Actinic keratosis: Secondary | ICD-10-CM | POA: Diagnosis not present

## 2023-03-29 DIAGNOSIS — D631 Anemia in chronic kidney disease: Secondary | ICD-10-CM

## 2023-03-29 DIAGNOSIS — L814 Other melanin hyperpigmentation: Secondary | ICD-10-CM

## 2023-03-29 DIAGNOSIS — Z1283 Encounter for screening for malignant neoplasm of skin: Secondary | ICD-10-CM | POA: Diagnosis not present

## 2023-03-29 DIAGNOSIS — Z8589 Personal history of malignant neoplasm of other organs and systems: Secondary | ICD-10-CM

## 2023-03-29 DIAGNOSIS — D1801 Hemangioma of skin and subcutaneous tissue: Secondary | ICD-10-CM

## 2023-03-29 DIAGNOSIS — Z7189 Other specified counseling: Secondary | ICD-10-CM

## 2023-03-29 DIAGNOSIS — L2089 Other atopic dermatitis: Secondary | ICD-10-CM

## 2023-03-29 DIAGNOSIS — N183 Chronic kidney disease, stage 3 unspecified: Secondary | ICD-10-CM

## 2023-03-29 LAB — HEMOGLOBIN AND HEMATOCRIT (CANCER CENTER ONLY)
HCT: 27.4 % — ABNORMAL LOW (ref 39.0–52.0)
Hemoglobin: 8.5 g/dL — ABNORMAL LOW (ref 13.0–17.0)

## 2023-03-29 MED ORDER — KETOCONAZOLE 2 % EX CREA: TOPICAL_CREAM | CUTANEOUS | 2 refills | Status: DC

## 2023-03-29 MED ORDER — EPOETIN ALFA-EPBX 40000 UNIT/ML IJ SOLN
40000.0000 [IU] | Freq: Once | INTRAMUSCULAR | Status: AC
  Administered 2023-03-29: 40000 [IU] via SUBCUTANEOUS
  Filled 2023-03-29: qty 1

## 2023-03-29 MED ORDER — EPOETIN ALFA-EPBX 10000 UNIT/ML IJ SOLN
10000.0000 [IU] | Freq: Once | INTRAMUSCULAR | Status: AC
  Administered 2023-03-29: 10000 [IU] via SUBCUTANEOUS
  Filled 2023-03-29: qty 1

## 2023-03-29 NOTE — Progress Notes (Deleted)
   Follow-Up Visit   Subjective  [name] is a 82 y.o. male who presents for the following: Psoriasis. 2 month follow up. Buttocks. Using Triamcinolone 0.1% cream as directed. States psoriasis is improving.  Has blister-like sore on buttocks. Will not resolve. Painful.  Check sun exposed areas. States has spent a lot of time fishing in sun. Hx of SCC, HxMM  The following portions of the chart were reviewed this encounter and updated as appropriate: medications, allergies, medical history  Review of Systems:  No other skin or systemic complaints except as noted in HPI or Assessment and Plan.  Objective  Well appearing patient in no apparent distress; mood and affect are within normal limits.  Areas Examined: Buttocks, right forearm, face, scalp, ears, hands, arms  Relevant exam findings are noted in the Assessment and Plan.      Assessment & Plan     PSORIASIS Well-demarcated erythematous papules/plaques with silvery scale, guttate pink scaly papules. ***% BSA.  wellcontrolled vs notatgoal vs flared***  Patient denies joint pain***  Treatment Plan: ***  Counseling on psoriasis and coordination of care  psoriasis is a chronic non-curable, but treatable genetic/hereditary disease that may have other systemic features affecting other organ systems such as joints (Psoriatic Arthritis). It is associated with an increased risk of inflammatory bowel disease, heart disease, non-alcoholic fatty liver disease, and depression.  Treatments include light and laser treatments; topical medications; and systemic medications including oral and injectables.    No follow-ups on file.  I, Lawson Radar, CMA, am acting as scribe for Armida Sans, MD.   Documentation: I have reviewed the above documentation for accuracy and completeness, and I agree with the above.  Armida Sans, MD

## 2023-03-29 NOTE — Patient Instructions (Addendum)
Cryotherapy Aftercare  Wash gently with soap and water everyday.   Apply Vaseline Jelly daily until healed.    Wound Care Instructions  Cleanse wound gently with soap and water once a day then pat dry with clean gauze. Apply a thin coat of Petrolatum (petroleum jelly, "Vaseline") over the wound (unless you have an allergy to this). We recommend that you use a new, sterile tube of Vaseline. Do not pick or remove scabs. Do not remove the yellow or white "healing tissue" from the base of the wound.  Cover the wound with fresh, clean, nonstick gauze and secure with paper tape. You may use Band-Aids in place of gauze and tape if the wound is small enough, but would recommend trimming much of the tape off as there is often too much. Sometimes Band-Aids can irritate the skin.  You should call the office for your biopsy report after 1 week if you have not already been contacted.  If you experience any problems, such as abnormal amounts of bleeding, swelling, significant bruising, significant pain, or evidence of infection, please call the office immediately.  FOR ADULT SURGERY PATIENTS: If you need something for pain relief you may take 1 extra strength Tylenol (acetaminophen) AND 2 Ibuprofen (200mg  each) together every 4 hours as needed for pain. (do not take these if you are allergic to them or if you have a reason you should not take them.) Typically, you may only need pain medication for 1 to 3 days.      Recommend daily broad spectrum sunscreen SPF 30+ to sun-exposed areas, reapply every 2 hours as needed. Call for new or changing lesions.  Staying in the shade or wearing long sleeves, sun glasses (UVA+UVB protection) and wide brim hats (4-inch brim around the entire circumference of the hat) are also recommended for sun protection.   Start Ketoconazole cream twice daily to affected area on buttocks until clear.   Stop using Triamcinolone on buttocks.   Use Triamcinolone 0.1% cream twice  daily up to 2 weeks as needed for rash on body. Avoid applying to face, groin, and axilla. Use as directed. Long-term use can cause thinning of the skin.  Due to recent changes in healthcare laws, you may see results of your pathology and/or laboratory studies on MyChart before the doctors have had a chance to review them. We understand that in some cases there may be results that are confusing or concerning to you. Please understand that not all results are received at the same time and often the doctors may need to interpret multiple results in order to provide you with the best plan of care or course of treatment. Therefore, we ask that you please give [place] 2 business days to thoroughly review all your results before contacting the office for clarification. Should we see a critical lab result, you will be contacted sooner.   If You Need Anything After Your Visit  If you have any questions or concerns for your doctor, please call our main line at [phone] and press option 4 to reach your doctor's medical assistant. If no one answers, please leave a voicemail as directed and we will return your call as soon as possible. Messages left after 4 pm will be answered the following business day.   You may also send [place] a message via MyChart. We typically respond to MyChart messages within 1-2 business days.  For prescription refills, please ask your pharmacy to contact our office. Our fax number is [phone].  If  you have an urgent issue when the clinic is closed that cannot wait until the next business day, you can page your doctor at the number below.    Please note that while we do our best to be available for urgent issues outside of office hours, we are not available 24/7.   If you have an urgent issue and are unable to reach [place], you may choose to seek medical care at your doctor's office, retail clinic, urgent care center, or emergency room.  If you have a medical emergency, please immediately  call 911 or go to the emergency department.  Pager Numbers  - Dr. Gwen Pounds: [phone]  - Dr. Roseanne Reno: [phone]  - Dr. Katrinka Blazing: [phone]   In the event of inclement weather, please call our main line at [phone] for an update on the status of any delays or closures.  Dermatology Medication Tips: Please keep the boxes that topical medications come in in order to help keep track of the instructions about where and how to use these. Pharmacies typically print the medication instructions only on the boxes and not directly on the medication tubes.   If your medication is too expensive, please contact our office at [phone] option 4 or send [place] a message through MyChart.   We are unable to tell what your co-pay for medications will be in advance as this is different depending on your insurance coverage. However, we may be able to find a substitute medication at lower cost or fill out paperwork to get insurance to cover a needed medication.   If a prior authorization is required to get your medication covered by your insurance company, please allow [place] 1-2 business days to complete this process.  Drug prices often vary depending on where the prescription is filled and some pharmacies may offer cheaper prices.  The website www.goodrx.com contains coupons for medications through different pharmacies. The prices here do not account for what the cost may be with help from insurance (it may be cheaper with your insurance), but the website can give you the price if you did not use any insurance.  - You can print the associated coupon and take it with your prescription to the pharmacy.  - You may also stop by our office during regular business hours and pick up a GoodRx coupon card.  - If you need your prescription sent electronically to a different pharmacy, notify our office through [facility] or by phone at [phone] option 4.     Si Usted Necesita Algo Despus de Su  Visita  Tambin puede enviarnos un mensaje a travs de Clinical cytogeneticist. Por lo general respondemos a los mensajes de MyChart en el transcurso de 1 a 2 das hbiles.  Para renovar recetas, por favor pida a su farmacia que se ponga en contacto con nuestra oficina. Annie Sable de fax es [place] [phone].  Si tiene un asunto urgente cuando la clnica est cerrada y que no puede esperar hasta el siguiente da hbil, puede llamar/localizar a su doctor(a) al nmero que aparece a continuacin.   Por favor, tenga en cuenta que aunque hacemos todo lo posible para estar disponibles para asuntos urgentes fuera del horario de Archie, no estamos disponibles las 24 horas del da, los 7 [address]  [address] de la [place].   Si tiene un problema urgente y no puede comunicarse con nosotros, puede optar por buscar atencin mdica  en el consultorio de su doctor(a), en una clnica privada, en un centro de atencin urgente o en  una sala de emergencias.  Si tiene Engineer, drilling, por favor llame inmediatamente al 911 o vaya a la sala de emergencias.  Nmeros de bper  - Dr. Gwen Pounds: [phone]  - Dra. Roseanne Reno: [phone]  - Dr. Katrinka Blazing: [phone]   En caso de inclemencias del tiempo, por favor llame a Lacy Duverney principal al [phone] para una actualizacin sobre el [place] de cualquier retraso o cierre.  Consejos para la medicacin en dermatologa: Por favor, guarde las cajas en las que vienen los medicamentos de uso tpico para ayudarle a seguir las instrucciones sobre dnde y cmo usarlos. Las farmacias generalmente imprimen las instrucciones del medicamento slo en las cajas y no directamente en los tubos del [place].   Si su medicamento es muy caro, por favor, pngase en contacto con Rolm Gala llamando al [phone] y presione la opcin 4 o envenos un mensaje a travs de Clinical cytogeneticist.   No podemos decirle cul ser su copago por los medicamentos por adelantado ya que esto es diferente dependiendo de  la cobertura de su seguro. Sin embargo, es posible que podamos encontrar un medicamento sustituto a Audiological scientist un formulario para que el seguro cubra el medicamento que se considera necesario.   Si se requiere una autorizacin previa para que su compaa de seguros [place] su medicamento, por favor permtanos de 1 a 2 das hbiles para completar [address]  Los precios de los medicamentos varan con frecuencia dependiendo del Environmental consultant de dnde se surte la receta y alguna farmacias pueden ofrecer precios ms baratos.  El sitio web www.goodrx.com tiene cupones para medicamentos de Health and safety inspector. Los precios aqu no tienen en cuenta lo que podra costar con la ayuda del seguro (puede ser ms barato con su seguro), pero el sitio web puede darle el precio si no utiliz Tourist information centre manager.  - Puede imprimir el cupn correspondiente y llevarlo con su receta a la farmacia.  - Tambin puede pasar por nuestra oficina durante el horario de atencin regular y Education officer, museum una tarjeta de cupones de GoodRx.  - Si necesita que su receta se enve electrnicamente a una farmacia diferente, informe a nuestra oficina a travs de MyChart de [place] o por telfono llamando al [phone] y presione la opcin 4.

## 2023-03-29 NOTE — Progress Notes (Signed)
Follow-Up Visit   Subjective  [name] is a 82 y.o. male who presents for the following: Skin Cancer Screening and Upper Body Skin Exam. Check sun exposed areas. States has spent a lot of time fishing in sun. Hx of SCC, HxMM  Psoriasis. 2 month follow up. Buttocks. Using Triamcinolone 0.1% cream as directed. States psoriasis may be improving.  Has blister-like sore on buttocks. Will not resolve. Painful. Dur: 3-4 weeks   The patient presents for Upper Body Skin Exam (UBSE) for skin cancer screening and mole check. The patient has spots, moles and lesions to be evaluated, some may be new or changing and the patient may have concern these could be cancer.    The following portions of the chart were reviewed this encounter and updated as appropriate: medications, allergies, medical history  Review of Systems:  No other skin or systemic complaints except as noted in HPI or Assessment and Plan.  Objective  Well appearing patient in no apparent distress; mood and affect are within normal limits.  All skin waist up examined. Relevant physical exam findings are noted in the Assessment and Plan.  sides and back x27 (27) Erythematous keratotic or waxy stuck-on papule or plaque.  face x4, neck x1 (5) Erythematous thin papules/macules with gritty scale.   medial buttocks including gluteal crease Erythematous, large, scaly plaque at medial buttocks including gluteal crease and superior aspect of buttocks. Ulceration at left medial gluteal crease.          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  - Call if any new or changing lesions are noted between office visits   HISTORY OF SQUAMOUS CELL CARCINOMA OF THE SKIN - No  evidence of recurrence today - No lymphadenopathy - Recommend regular full body skin exams - Recommend daily broad spectrum sunscreen SPF 30+ to sun-exposed areas, reapply every 2 hours as needed.  - Call if any new or changing lesions are noted between office visits    Inflamed seborrheic keratosis (27) sides and back x27  Symptomatic, irritating, patient would like treated.  Destruction of lesion - sides and back x27 (27) Complexity: simple   Destruction method: cryotherapy   Informed consent: discussed and consent obtained   Timeout:  patient name, date of birth, surgical site, and procedure verified Lesion destroyed using liquid nitrogen: Yes   Region frozen until ice ball extended beyond lesion: Yes   Outcome: patient tolerated procedure well with no complications   Post-procedure details: wound care instructions given   Additional details:  Prior to procedure, discussed risks of blister formation, small wound, skin dyspigmentation, or rare scar following cryotherapy. Recommend Vaseline ointment to treated areas while healing.   AK (actinic keratosis) (5) face x4, neck x1  Actinic keratoses are precancerous spots that appear secondary to cumulative UV radiation exposure/sun exposure over time. They are chronic with expected duration over 1 year. A portion of actinic keratoses will progress to squamous cell carcinoma of the skin. It is not possible to reliably predict which spots will progress to skin cancer and so treatment is recommended to prevent development of skin cancer.  Recommend daily broad spectrum sunscreen SPF 30+ to sun-exposed areas, reapply every 2 hours as needed.  Recommend staying in  the shade or wearing long sleeves, sun glasses (UVA+UVB protection) and wide brim hats (4-inch brim around the entire circumference of the hat). Call for new or changing lesions.  Destruction of lesion - face x4, neck x1 (5) Complexity: simple   Destruction method: cryotherapy    Informed consent: discussed and consent obtained   Timeout:  patient name, date of birth, surgical site, and procedure verified Lesion destroyed using liquid nitrogen: Yes   Region frozen until ice ball extended beyond lesion: Yes   Outcome: patient tolerated procedure well with no complications   Post-procedure details: wound care instructions given   Additional details:  Prior to procedure, discussed risks of blister formation, small wound, skin dyspigmentation, or rare scar following cryotherapy. Recommend Vaseline ointment to treated areas while healing.   Rash medial buttocks including gluteal crease  Start Ketoconazole cream twice daily to affected area on buttocks until clear.   Stop using Triamcinolone on buttocks.  Skin / nail biopsy - medial buttocks including gluteal crease Type of biopsy: tangential   Informed consent: discussed and consent obtained   Timeout: patient name, date of birth, surgical site, and procedure verified   Procedure prep:  Patient was prepped and draped in usual sterile fashion Prep type:  Isopropyl alcohol Anesthesia: the lesion was anesthetized in a standard fashion   Anesthetic:  1% lidocaine w/ epinephrine 1-100,000 buffered w/ 8.4% NaHCO3 Instrument used: flexible razor blade   Hemostasis achieved with: pressure, aluminum chloride and electrodesiccation   Outcome: patient tolerated procedure well   Post-procedure details: sterile dressing applied and wound care instructions given   Dressing type: bandage and petrolatum    Specimen 1 - Surgical pathology Differential Diagnosis: Tinea vs psoriasis   Check Margins: No   Skin cancer screening performed today.  Actinic Damage - Chronic condition, secondary to cumulative UV/sun exposure - diffuse scaly erythematous macules with underlying dyspigmentation - Recommend daily broad spectrum sunscreen SPF 30+ to sun-exposed areas, reapply every 2 hours as needed.  - Staying in the shade or  wearing long sleeves, sun glasses (UVA+UVB protection) and wide brim hats (4-inch brim around the entire circumference of the hat) are also recommended for sun protection.  - Call for new or changing lesions.  Lentigines, Seborrheic Keratoses, Hemangiomas - Benign normal skin lesions - Benign-appearing - Call for any changes  Melanocytic Nevi - Tan-brown and/or pink-flesh-colored symmetric macules and papules - Benign appearing on exam today - Observation - Call clinic for new or changing moles - Recommend daily use of broad spectrum spf 30+ sunscreen to sun-exposed areas.   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 Condition is bothersome/symptomatic for patient. Currently flared.   Atopic dermatitis (eczema) is a chronic, relapsing, pruritic condition that can significantly affect quality of life. It is often associated with allergic rhinitis and/or asthma and can require treatment with topical medications, phototherapy, or in severe cases biologic injectable medication (Dupixent; Adbry) or Oral JAK inhibitors.  Treatment Plan: Use Triamcinolone 0.1% cream twice daily up to 2 weeks as needed for rash on body.   Recommend gentle skin care.   Return if symptoms worsen or fail to improve, for pending pathology results.  I, Lawson Radar, CMA, am acting as scribe for Armida Sans, MD.   Documentation: I have reviewed the above documentation for accuracy and completeness, and I agree with the above.  Armida Sans, MD

## 2023-04-03 ENCOUNTER — Ambulatory Visit: Payer: Medicare Other | Admitting: Family Medicine

## 2023-04-03 LAB — SURGICAL PATHOLOGY

## 2023-04-04 ENCOUNTER — Encounter: Payer: Self-pay | Admitting: Family Medicine

## 2023-04-04 ENCOUNTER — Encounter: Payer: Self-pay | Admitting: Neurosurgery

## 2023-04-04 ENCOUNTER — Other Ambulatory Visit: Payer: Self-pay | Admitting: Family Medicine

## 2023-04-04 ENCOUNTER — Ambulatory Visit (INDEPENDENT_AMBULATORY_CARE_PROVIDER_SITE_OTHER): Payer: Medicare Other | Admitting: Neurosurgery

## 2023-04-04 ENCOUNTER — Ambulatory Visit (INDEPENDENT_AMBULATORY_CARE_PROVIDER_SITE_OTHER): Payer: Medicare Other | Admitting: Family Medicine

## 2023-04-04 ENCOUNTER — Telehealth: Payer: Self-pay

## 2023-04-04 VITALS — BP 127/71 | HR 69 | Temp 98.1°F | Wt 180.0 lb

## 2023-04-04 VITALS — BP 128/78 | Ht 70.0 in | Wt 179.0 lb

## 2023-04-04 DIAGNOSIS — B356 Tinea cruris: Secondary | ICD-10-CM

## 2023-04-04 DIAGNOSIS — M5416 Radiculopathy, lumbar region: Secondary | ICD-10-CM | POA: Diagnosis not present

## 2023-04-04 DIAGNOSIS — R42 Dizziness and giddiness: Secondary | ICD-10-CM

## 2023-04-04 MED ORDER — TERBINAFINE HCL 250 MG PO TABS: 250.0000 mg | ORAL_TABLET | Freq: Every day | ORAL | 0 refills | Status: DC

## 2023-04-04 NOTE — Progress Notes (Signed)
BP 127/71   Pulse 69   Temp 98.1 F (36.7 C) (Oral)   Wt 180 lb (81.6 kg)   SpO2 98%   BMI 25.83 kg/m    Subjective:    Patient ID: [name], male    DOB: 1940-11-05, 82 y.o.   MRN: [id number]  HPI: [name] is a 82 y.o. male  Chief Complaint  Patient presents with   Dizziness    [place] and stayed dizzy for two days     DIZZINESS Duration: couple of days Description of symptoms: room spinning Duration of episode: minutes Dizziness frequency: recurrent Provoking factors: getting out of bed Aggravating factors:  moving his head Triggered by rolling over in bed: yes Triggered by bending over: yes Aggravated by head movement: yes Aggravated by exertion, coughing, loud noises: no Recent head injury: no Recent or current viral symptoms: no History of vasovagal episodes: no Nausea: yes Vomiting: no Tinnitus: no Hearing loss: yes Aural fullness: yes Headache: yes Photophobia/phonophobia: no Unsteady gait: no Postural instability: yes Diplopia: No Dysarthria, dysphagia or weakness: no Related to exertion: no Pallor: no Diaphoresis: no Dyspnea: no Chest pain: no  Relevant past medical, surgical, family and social history reviewed and updated as indicated. Interim medical history since our last visit reviewed. Allergies and medications reviewed and updated.  Review of Systems  Constitutional: Negative.   Respiratory: Negative.    Cardiovascular: Negative.   Gastrointestinal: Negative.   Musculoskeletal: Negative.   Neurological:  Positive for dizziness. Negative for tremors, seizures, syncope, facial asymmetry, speech difficulty, weakness, light-headedness, numbness and headaches.  Psychiatric/Behavioral: Negative.      Per HPI unless specifically indicated above     Objective:    BP 127/71   Pulse 69   Temp 98.1 F (36.7 C) (Oral)   Wt 180 lb (81.6 kg)   SpO2 98%   BMI 25.83 kg/m   Wt Readings from Last  3 Encounters:  04/04/23 180 lb (81.6 kg)  04/04/23 179 lb (81.2 kg)  02/22/23 173 lb (78.5 kg)    Physical Exam Vitals and nursing note reviewed.  Constitutional:      General: He is not in acute distress.    Appearance: Normal appearance. He is not ill-appearing, toxic-appearing or diaphoretic.  HENT:     Head: Normocephalic and atraumatic.     Right Ear: Tympanic membrane, ear canal and external ear normal. There is no impacted cerumen.     Left Ear: Tympanic membrane, ear canal and external ear normal. There is no impacted cerumen.     Nose: Nose normal. No congestion or rhinorrhea.     Mouth/Throat:     Mouth: Mucous membranes are moist.     Pharynx: Oropharynx is clear.  Eyes:     General: No scleral icterus.       Right eye: No discharge.        Left eye: No discharge.     Extraocular Movements: Extraocular movements intact.     Right eye: Nystagmus present.     Left eye: Nystagmus present.     Conjunctiva/sclera: Conjunctivae normal.     Pupils: Pupils are equal, round, and reactive to light.  Cardiovascular:     Rate and Rhythm: Normal rate and regular rhythm.     Pulses: Normal pulses.     Heart sounds: Normal heart sounds. No murmur heard.    No friction rub. No gallop.  Pulmonary:     Effort:  Pulmonary effort is normal. No respiratory distress.     Breath sounds: Normal breath sounds. No stridor. No wheezing, rhonchi or rales.  Chest:     Chest wall: No tenderness.  Musculoskeletal:        General: Normal range of motion.     Cervical back: Normal range of motion and neck supple.  Skin:    General: Skin is warm and dry.     Capillary Refill: Capillary refill takes less than 2 seconds.     Coloration: Skin is not jaundiced or pale.     Findings: No bruising, erythema, lesion or rash.  Neurological:     General: No focal deficit present.     Mental Status: He is alert and oriented to person, place, and time. Mental status is at baseline.  Psychiatric:         Mood and Affect: Mood normal.        Behavior: Behavior normal.        Thought Content: Thought content normal.        Judgment: Judgment normal.     Results for orders placed or performed in visit on 03/29/23  Surgical pathology  Result Value Ref Range   SURGICAL PATHOLOGY      SURGICAL PATHOLOGY [facility] [address], [address] [place], [place] [address] Telephone [phone] or [phone] Fax [phone]  REPORT OF DERMATOPATHOLOGY   Accession #: [id number] Patient Name: [name] Visit # : [id number]  MRN: [id number] Cytotechnologist: Munoz-Bishop Md, Efraim Kaufmann, Dermatopathologist, Electronic Signature DOB/Age [birth year] (Age: 62) Gender: M Collected Date: 03/29/2023 Received Date: 03/30/2023  FINAL DIAGNOSIS       1. Skin, right medial buttock :       TINEA (DERMATOPHYTOSIS)       DATE SIGNED OUT: 04/03/2023 ELECTRONIC SIGNATURE : Munoz-Bishop Md, Melissa, Dermatopathologist, Electronic Signature  MICROSCOPIC DESCRIPTION 1. There are hyphae within the stratum corneum consistent with dermatophytes, highlighted with a PAS stain, and underlying spongiosis.  CASE COMMENTS STAINS USED IN DIAGNOSIS: H&E Stains used in diagnosis 1 *PAS/F Stain    CLINICAL HISTORY  SPECIMEN(S) OBTAINED 1 . Skin, Right Medial Buttock  SPECIMEN COMMENTS: SPECIMEN CLINICAL INFORMATION: 1. Rash, tinea vs psoriasis    Gross Description 1. Formalin fixed specimen received:  10 X 8 X 1 MM, TOTO (4 P) (1 B) ( AE )        Report signed out from the following location(s) [place]. [facility] [address] [place], [place] [address] CLIA #: [id number]  [facility] [address] [place], [place] [address] CLIA #: [id number]       Assessment & Plan:   Problem List Items Addressed This Visit   None Visit Diagnoses     Vertigo    -  Primary   Will refer to PT and start epley's manuvers. Call with any  concerns.   Relevant Orders   Ambulatory referral to Physical Therapy        Follow up plan: Return in about 4 weeks (around 05/02/2023).

## 2023-04-04 NOTE — Telephone Encounter (Addendum)
Tried calling patient regarding results. No answer. LMOM for patient to return call.   Rx of Lamisil 250 mg send to [facility].    ----- Message from Armida Sans sent at 04/03/2023  6:01 PM EDT ----- FINAL DIAGNOSIS        1. Skin, right medial buttock :       TINEA (DERMATOPHYTOSIS)   Fungal infection of the skin. Continue Ketoconazole cream. Start Lamisil pills 250 mg Take 1 po every day #30.  Send in to pharmacy.  Pts Liver tests in August 2024 were OK.  Make pt follow up appt in around 6 mos if does not have a follow up

## 2023-04-04 NOTE — Progress Notes (Signed)
Referring Physician:  [name], DO [address] [place],  [place] [address]  Primary Physician:  [name], DO  History of Present Illness: 04/04/2023 [name] returns to see me.  His pain is much improved.  His weakness is also improved.  He is still having some pain around his right hip and anterior thigh, but is not nearly as bad.   02/09/2023 [name] is here today with a chief complaint of acute onset of back pain with some leg pain approximate 1 to 2 weeks ago.  He had severe pain in his right buttock and his anterior thigh and into his groin.  He had some trouble walking and has started using a cane for his balance.  His pain has substantially improved over the past several days.  Walking and standing make his pain worse.  Sitting makes it better.  He has had some instability with his right leg, but he is walking.  Currently 80% of his pain is in his back and 20% is in his right anterior thigh.   Bowel/Bladder Dysfunction: none  Conservative measures:  Physical therapy:  has not participated  Multimodal medical therapy including regular antiinflammatories:  robaxin, gabapentin  Injections:  has not received any epidural steroid injections  Past Surgery:   Lumbar Surgery about 25 years ago  [name] has no symptoms of cervical myelopathy.  The symptoms are causing a significant impact on the patient's life.   I have utilized the care everywhere function in epic to review the outside records available from external health systems.  Review of Systems:  A 10 point review of systems is negative, except for the pertinent positives and negatives detailed in the HPI.  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   right forearm, [facility]   Stroke (HCC) 11/2007   R brain subcortical infarct    Past Surgical History: Past Surgical History:  Procedure Laterality Date   ASPIRATION / INJECTION RENAL CYST  Feb 2017   BACK SURGERY     approx 20- 25 years ago   GAS INSERTION  08/11/2011   Procedure: INSERTION OF GAS;  Surgeon: Sherrie George, MD;  Location: [facility] OR;  Service: Ophthalmology;  Laterality: Right;  [id number]   SCLERAL BUCKLE  08/11/2011   Procedure: SCLERAL BUCKLE;  Surgeon: Sherrie George, MD;  Location: [facility] OR;  Service: Ophthalmology;  Laterality: Right;   VARICOSE VEIN SURGERY      Allergies: Allergies as of 04/04/2023 - Review Complete 04/04/2023  Allergen Reaction Noted   Meloxicam Nausea And Vomiting 11/20/2014    Medications:  Current Outpatient Medications:    albuterol (VENTOLIN HFA) 108 (90 Base) MCG/ACT inhaler, Inhale 2 puffs into the lungs every 6 (six) hours as needed for wheezing or shortness of breath., Disp: 8 g, Rfl: 12   amLODipine (NORVASC) 10 MG tablet, Take 1 tablet (10 mg total) by mouth every evening., Disp: 90 tablet, Rfl: 1   azelastine (ASTELIN) 0.1 % nasal spray, Place 1 spray into both nostrils  2 (two) times daily. Use in each nostril as directed, Disp: 30 mL, Rfl: 0   calcium carbonate (OSCAL) 1500 (600 Ca) MG TABS tablet, Take by mouth., Disp: , Rfl:    Cholecalciferol (D3-1000 PO), Take by mouth daily., Disp: , Rfl:    clopidogrel (PLAVIX) 75 MG tablet, TAKE ONE TABLET BY MOUTH EVERY DAY, Disp: 90 tablet, Rfl: 0   DULoxetine (CYMBALTA) 20 MG capsule, TAKE 1 CAPSULE BY MOUTH DAILY., Disp: 90 capsule, Rfl: 1   gabapentin (NEURONTIN) 400 MG capsule, TAKE 1 CAPSULE BY MOUTH EVERY MORNING AND TAKE 2 CAPSULES AT BEDTIME, Disp: 270 capsule, Rfl: 0   Ginsengs-Saw Palmetto (MULTI GINSENG & SAW PALMETTO) 500 MG CAPS, Take 500 mg by mouth  daily., Disp: , Rfl:    ketoconazole (NIZORAL) 2 % cream, Apply twice daily to affected area on buttocks until clear, Disp: 60 g, Rfl: 2   levothyroxine (SYNTHROID) 25 MCG tablet, TAKE 1 TABLET EVERY DAY ON EMPTY STOMACHWITH A GLASS OF WATER AT LEAST 30-60 MINBEFORE BREAKFAST, Disp: 90 tablet, Rfl: 0   lidocaine (LIDODERM) 5 %, Place 1 patch onto the skin daily. Remove & Discard patch within 12 hours or as directed by MD, Disp: 60 patch, Rfl: 5   losartan (COZAAR) 100 MG tablet, Take 1 tablet (100 mg total) by mouth every morning., Disp: 90 tablet, Rfl: 1   meclizine (ANTIVERT) 25 MG tablet, TAKE 1 TABLET BY MOUTH THREE TIMES DAILYAS NEEDED FOR DIZZINESS, Disp: 90 tablet, Rfl: 6   Multiple Vitamin (MULITIVITAMIN WITH MINERALS) TABS, Take 1 tablet by mouth daily., Disp: , Rfl:    Naproxen Sodium 220 MG CAPS, Take by mouth as needed., Disp: , Rfl:    omeprazole (PRILOSEC) 40 MG capsule, Take 1 capsule (40 mg total) by mouth daily., Disp: 90 capsule, Rfl: 1   patiromer (VELTASSA) 8.4 g packet, Take 1 packet (8.4 g total) by mouth daily., Disp: 5 each, Rfl: 0   predniSONE (DELTASONE) 50 MG tablet, Take 1 tablet (50 mg total) by mouth daily with breakfast., Disp: 5 tablet, Rfl: 0   simvastatin (ZOCOR) 40 MG tablet, Take 1 tablet (40 mg total) by mouth daily., Disp: 90 tablet, Rfl: 1   tadalafil (CIALIS) 5 MG tablet, Take 1 tablet (5 mg total) by mouth daily., Disp: 90 tablet, Rfl: 3   tamsulosin (FLOMAX) 0.4 MG CAPS capsule, TAKE 2 CAPSULES BY MOUTH EVERY DAY, Disp: 180 capsule, Rfl: 1   terbinafine (LAMISIL) 250 MG tablet, Take 1 tablet (250 mg total) by mouth daily., Disp: 30 tablet, Rfl: 0   triamcinolone cream (KENALOG) 0.1 %, Apply to affected skin rash on buttock twice a day for 5 days, each week prn, Disp: 80 g, Rfl: 3   zinc gluconate 50 MG tablet, Take by mouth., Disp: , Rfl:  No current facility-administered medications for this visit.  Facility-Administered Medications Ordered in Other  Visits:    epoetin alfa-epbx (RETACRIT) injection 10,000 Units, 10,000 Units, Subcutaneous, Once, Christian Hines, MD   epoetin alfa-epbx (RETACRIT) injection 40,000 Units, 40,000 Units, Subcutaneous, Once, Christian Hines, MD  Social History: Social History   Tobacco Use   Smoking status: Former    Current packs/day: 0.00    Types: Cigarettes    Quit date: 06/06/2010    Years since quitting: 12.8   Smokeless tobacco: Never  Vaping Use   Vaping status: Never Used  Substance Use Topics   Alcohol use: No    Alcohol/week: 0.0 standard drinks of alcohol  Drug use: No    Family Medical History: Family History  Problem Relation Age of Onset   Heart disease Father    Diabetes Son    Kidney disease Neg Hx    Prostate cancer Neg Hx     Physical Examination: Vitals:   04/04/23 1128  BP: 128/78    General: Patient is in no apparent distress. Attention to examination is appropriate.  Neck:   Supple.  Full range of motion.  Respiratory: Patient is breathing without any difficulty.   NEUROLOGICAL:     Awake, alert, oriented to person, place, and time.  Speech is clear and fluent.   Cranial Nerves: Pupils equal round and reactive to light.  Facial tone is symmetric.  Facial sensation is symmetric. Shoulder shrug is symmetric. Tongue protrusion is midline.  There is no pronator drift.  Strength: Side Biceps Triceps Deltoid Interossei Grip Wrist Ext. Wrist Flex.  R 5 5 5 5 5 5 5   L 5 5 5 5 5 5 5    Side Iliopsoas Quads Hamstring PF DF EHL  R 4+ 4+ 5 5 5 5   L 5 5 5 5 5 5    Reflexes are 1+ and symmetric at the biceps, triceps, brachioradialis, patella and achilles.   Hoffman's is absent.   Bilateral upper and lower extremity sensation is intact to light touch.    No evidence of dysmetria noted.  Gait is abnormal-uses cane.     Medical Decision Making  Imaging: MRI L spine 02/02/2023 - At L2-L3, asymmetric predominantly right-sided disc bulge displaces the extraforaminal  right L2 nerve root, and a large right subarticular disc extrusion with caudal migration compresses the exiting right L3 nerve root   - At L3-L4, asymmetric disc bulge and left foraminal disc protrusion cause moderate spinal canal stenosis and compression of the left extraforaminal L3 nerve root.   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   I have personally reviewed the images and agree with the above interpretation.  Assessment and Plan: [name] is a pleasant 82 y.o. male with lumbar radiculopathy that has resolved significantly with NSAIDs and an injection.  At this point, I have not recommended any further treatment.  I will see him back on an as-needed basis.  He is having shoulder and knee procedure in the next few weeks.  I wished him good luck.    I spent a total of 10 minutes in this patient's care today. This time was spent reviewing pertinent records including imaging studies, obtaining and confirming history, performing a directed evaluation, formulating and discussing my recommendations, and documenting the visit within the medical record.       Jerilyn Gillaspie K. Myer Haff MD, [facility] Neurosurgery

## 2023-04-04 NOTE — Telephone Encounter (Signed)
Patient called back and left nurse msg. Called patient back again, no answer and no VM option. aw

## 2023-04-05 ENCOUNTER — Other Ambulatory Visit: Payer: Self-pay | Admitting: Surgery

## 2023-04-05 ENCOUNTER — Inpatient Hospital Stay: Payer: Medicare Other

## 2023-04-05 ENCOUNTER — Telehealth: Payer: Self-pay

## 2023-04-05 VITALS — BP 140/70

## 2023-04-05 DIAGNOSIS — M9901 Segmental and somatic dysfunction of cervical region: Secondary | ICD-10-CM | POA: Diagnosis not present

## 2023-04-05 DIAGNOSIS — R809 Proteinuria, unspecified: Secondary | ICD-10-CM | POA: Diagnosis not present

## 2023-04-05 DIAGNOSIS — M5412 Radiculopathy, cervical region: Secondary | ICD-10-CM | POA: Diagnosis not present

## 2023-04-05 DIAGNOSIS — D631 Anemia in chronic kidney disease: Secondary | ICD-10-CM

## 2023-04-05 DIAGNOSIS — M9903 Segmental and somatic dysfunction of lumbar region: Secondary | ICD-10-CM | POA: Diagnosis not present

## 2023-04-05 DIAGNOSIS — I129 Hypertensive chronic kidney disease with stage 1 through stage 4 chronic kidney disease, or unspecified chronic kidney disease: Secondary | ICD-10-CM | POA: Diagnosis not present

## 2023-04-05 DIAGNOSIS — N184 Chronic kidney disease, stage 4 (severe): Secondary | ICD-10-CM | POA: Diagnosis not present

## 2023-04-05 DIAGNOSIS — N281 Cyst of kidney, acquired: Secondary | ICD-10-CM | POA: Diagnosis not present

## 2023-04-05 DIAGNOSIS — E875 Hyperkalemia: Secondary | ICD-10-CM | POA: Diagnosis not present

## 2023-04-05 DIAGNOSIS — D7581 Myelofibrosis: Secondary | ICD-10-CM

## 2023-04-05 DIAGNOSIS — N1832 Chronic kidney disease, stage 3b: Secondary | ICD-10-CM | POA: Diagnosis not present

## 2023-04-05 DIAGNOSIS — M5033 Other cervical disc degeneration, cervicothoracic region: Secondary | ICD-10-CM | POA: Diagnosis not present

## 2023-04-05 LAB — HEMOGLOBIN AND HEMATOCRIT (CANCER CENTER ONLY)
HCT: 29.8 % — ABNORMAL LOW (ref 39.0–52.0)
Hemoglobin: 9 g/dL — ABNORMAL LOW (ref 13.0–17.0)

## 2023-04-05 MED ORDER — EPOETIN ALFA-EPBX 10000 UNIT/ML IJ SOLN
10000.0000 [IU] | Freq: Once | INTRAMUSCULAR | Status: AC
  Administered 2023-04-05: 10000 [IU] via SUBCUTANEOUS
  Filled 2023-04-05: qty 1

## 2023-04-05 MED ORDER — EPOETIN ALFA-EPBX 40000 UNIT/ML IJ SOLN
40000.0000 [IU] | Freq: Once | INTRAMUSCULAR | Status: AC
  Administered 2023-04-05: 40000 [IU] via SUBCUTANEOUS
  Filled 2023-04-05: qty 1

## 2023-04-05 NOTE — Telephone Encounter (Signed)
Requested Prescriptions  Pending Prescriptions Disp Refills   DULoxetine (CYMBALTA) 20 MG capsule [Pharmacy Med Name: DULOXETINE HCL 20 MG CAP] 90 capsule 1    Sig: TAKE 1 CAPSULE BY MOUTH EVERY DAY     Psychiatry: Antidepressants - SNRI - duloxetine Failed - 04/04/2023 10:04 AM      Failed - Cr in normal range and within 360 days    Creatinine  Date Value Ref Range Status  10/19/2022 2.08 (H) 0.61 - 1.24 mg/dL Final  25/36/6440 3.47 0.60 - 1.30 mg/dL Final   Creatinine, Ser  Date Value Ref Range Status  01/25/2023 1.91 (H) 0.61 - 1.24 mg/dL Final         Passed - eGFR is 30 or above and within 360 days    EGFR (African American)  Date Value Ref Range Status  06/18/2014 >60 >12mL/min Final   GFR calc Af Amer  Date Value Ref Range Status  05/25/2020 55 (L) >59 mL/min/1.73 Final    Comment:    **In accordance with recommendations from the NKF-ASN Task force,**   [facility] is in the process of updating its eGFR calculation to the   2021 CKD-EPI creatinine equation that estimates kidney function   without a race variable.    EGFR (Non-African Amer.)  Date Value Ref Range Status  06/18/2014 >60 >26mL/min Final    Comment:    eGFR values <83mL/min/1.73 m2 may be an indication of chronic kidney disease (CKD). Calculated eGFR, using the MRDR Study equation, is useful in  patients with stable renal function. The eGFR calculation will not be reliable in acutely ill patients when serum creatinine is changing rapidly. It is not useful in patients on dialysis. The eGFR calculation may not be applicable to patients at the low and high extremes of body sizes, pregnant women, and vegetarians.    GFR, Estimated  Date Value Ref Range Status  01/25/2023 35 (L) >60 mL/min Final    Comment:    (NOTE) Calculated using the CKD-EPI Creatinine Equation (2021)   10/19/2022 31 (L) >60 mL/min Final    Comment:    (NOTE) Calculated using the CKD-EPI Creatinine Equation (2021)    eGFR   Date Value Ref Range Status  11/01/2022 30 (L) >59 mL/min/1.73 Final         Passed - Completed PHQ-2 or PHQ-9 in the last 360 days      Passed - Last BP in normal range    BP Readings from Last 1 Encounters:  04/04/23 127/71         Passed - Valid encounter within last 6 months    Recent Outpatient Visits           Yesterday Vertigo   [place] [facility] [place], [place] P, DO   1 month ago Diarrhea, unspecified type   [place] [facility], Megan P, DO   2 months ago Weakness of left leg   [place] [facility] [place], Sherran Needs, NP   4 months ago Upper respiratory tract infection, unspecified type   [place] [facility] [place], Megan P, DO   5 months ago Hyperkalemia   [place] [facility] [place], Oralia Rud, DO       Future Appointments             In 3 weeks Laural Benes, Oralia Rud, DO [place] [facility]

## 2023-04-05 NOTE — Telephone Encounter (Signed)
ERROR

## 2023-04-05 NOTE — Telephone Encounter (Signed)
Patient advised of results, medication sent in and 6 month follow up scheduled. aw

## 2023-04-10 ENCOUNTER — Encounter
Admission: RE | Admit: 2023-04-10 | Discharge: 2023-04-10 | Disposition: A | Discharge status: Home / Self Care | Payer: Medicare Other | Source: Ambulatory Visit | Attending: Surgery | Admitting: Surgery

## 2023-04-10 ENCOUNTER — Other Ambulatory Visit: Payer: Self-pay

## 2023-04-10 VITALS — BP 140/70 | HR 78 | Resp 16 | Wt 182.5 lb

## 2023-04-10 DIAGNOSIS — M12811 Other specific arthropathies, not elsewhere classified, right shoulder: Secondary | ICD-10-CM | POA: Diagnosis not present

## 2023-04-10 DIAGNOSIS — Z01810 Encounter for preprocedural cardiovascular examination: Secondary | ICD-10-CM | POA: Diagnosis not present

## 2023-04-10 DIAGNOSIS — I440 Atrioventricular block, first degree: Secondary | ICD-10-CM | POA: Diagnosis not present

## 2023-04-10 DIAGNOSIS — Z01818 Encounter for other preprocedural examination: Secondary | ICD-10-CM | POA: Diagnosis not present

## 2023-04-10 DIAGNOSIS — Z01812 Encounter for preprocedural laboratory examination: Secondary | ICD-10-CM

## 2023-04-10 DIAGNOSIS — S46011A Strain of muscle(s) and tendon(s) of the rotator cuff of right shoulder, initial encounter: Secondary | ICD-10-CM | POA: Diagnosis not present

## 2023-04-10 HISTORY — DX: Strain of muscle, fascia and tendon of other parts of biceps, right arm, initial encounter: S46.211A

## 2023-04-10 HISTORY — DX: Personal history of urinary calculi: Z87.442

## 2023-04-10 HISTORY — DX: Hypothyroidism, unspecified: E03.9

## 2023-04-10 HISTORY — DX: COVID-19: U07.1

## 2023-04-10 HISTORY — DX: Strain of other muscles, fascia and tendons at shoulder and upper arm level, right arm, initial encounter: S46.811A

## 2023-04-10 LAB — COMPREHENSIVE METABOLIC PANEL
ALT: 18 U/L (ref 0–44)
AST: 20 U/L (ref 15–41)
Albumin: 3.8 g/dL (ref 3.5–5.0)
Alkaline Phosphatase: 72 U/L (ref 38–126)
Anion gap: 6 (ref 5–15)
BUN: 47 mg/dL — ABNORMAL HIGH (ref 8–23)
CO2: 22 mmol/L (ref 22–32)
Calcium: 8.1 mg/dL — ABNORMAL LOW (ref 8.9–10.3)
Chloride: 113 mmol/L — ABNORMAL HIGH (ref 98–111)
Creatinine, Ser: 2.37 mg/dL — ABNORMAL HIGH (ref 0.61–1.24)
GFR, Estimated: 27 mL/min — ABNORMAL LOW (ref >60)
Glucose, Bld: 161 mg/dL — ABNORMAL HIGH (ref 70–99)
Potassium: 4.9 mmol/L (ref 3.5–5.1)
Sodium: 141 mmol/L (ref 135–145)
Total Bilirubin: 0.9 mg/dL (ref <1.2)
Total Protein: 6.3 g/dL — ABNORMAL LOW (ref 6.5–8.1)

## 2023-04-10 LAB — CBC WITH DIFFERENTIAL/PLATELET
Abs Immature Granulocytes: 0.47 10*3/uL — ABNORMAL HIGH (ref 0.00–0.07)
Basophils Absolute: 0 10*3/uL (ref 0.0–0.1)
Basophils Relative: 1 %
Eosinophils Absolute: 0.1 10*3/uL (ref 0.0–0.5)
Eosinophils Relative: 1 %
HCT: 28.7 % — ABNORMAL LOW (ref 39.0–52.0)
Hemoglobin: 8.9 g/dL — ABNORMAL LOW (ref 13.0–17.0)
Immature Granulocytes: 10 %
Lymphocytes Relative: 13 %
Lymphs Abs: 0.6 10*3/uL — ABNORMAL LOW (ref 0.7–4.0)
MCH: 29.4 pg (ref 26.0–34.0)
MCHC: 31 g/dL (ref 30.0–36.0)
MCV: 94.7 fL (ref 80.0–100.0)
Monocytes Absolute: 0.6 10*3/uL (ref 0.1–1.0)
Monocytes Relative: 13 %
Neutro Abs: 2.9 10*3/uL (ref 1.7–7.7)
Neutrophils Relative %: 62 %
Platelets: 196 10*3/uL (ref 150–400)
RBC: 3.03 MIL/uL — ABNORMAL LOW (ref 4.22–5.81)
RDW: 19.3 % — ABNORMAL HIGH (ref 11.5–15.5)
Smear Review: NORMAL
WBC: 4.8 10*3/uL (ref 4.0–10.5)
nRBC: 1.3 % — ABNORMAL HIGH (ref 0.0–0.2)

## 2023-04-10 LAB — URINALYSIS, ROUTINE W REFLEX MICROSCOPIC
Bacteria, UA: NONE SEEN
Bilirubin Urine: NEGATIVE
Glucose, UA: >=500 mg/dL — AB
Hgb urine dipstick: NEGATIVE
Ketones, ur: NEGATIVE mg/dL
Leukocytes,Ua: NEGATIVE
Nitrite: NEGATIVE
Protein, ur: >=300 mg/dL — AB
Specific Gravity, Urine: 1.015 (ref 1.005–1.030)
pH: 5 (ref 5.0–8.0)

## 2023-04-10 LAB — SURGICAL PCR SCREEN
MRSA, PCR: NEGATIVE
Staphylococcus aureus: NEGATIVE

## 2023-04-10 NOTE — Patient Instructions (Addendum)
Your procedure is scheduled on: 04/13/23 - Thursday Report to the Registration Desk on the 1st floor of the [facility]. To find out your arrival time, please call [phone] between 1PM - 3PM on: 04/12/23 - Wednesday If your arrival time is 6:00 am, do not arrive before that time as the [facility] entrance doors do not open until 6:00 am.  REMEMBER: Instructions that are not followed completely may result in serious medical risk, up to and including death; or upon the discretion of your surgeon and anesthesiologist your surgery may need to be rescheduled.  Do not eat food after midnight the night before surgery.  No gum chewing or hard candies.  You may however, drink CLEAR liquids up to 2 hours before you are scheduled to arrive for your surgery. Do not drink anything within 2 hours of your scheduled arrival time.  Clear liquids include: - water  - apple juice without pulp - gatorade (not RED colors) - black coffee or tea (Do NOT add milk or creamers to the coffee or tea) Do NOT drink anything that is not on this list.  In addition, your doctor has ordered for you to drink the provided:  Ensure Pre-Surgery Clear Carbohydrate Drink  Drinking this carbohydrate drink up to two hours before surgery helps to reduce insulin resistance and improve patient outcomes. Please complete drinking 2 hours before scheduled arrival time.  One week prior to surgery: Stop Anti-inflammatories (NSAIDS) such as Advil, Aleve, Ibuprofen, Motrin, Naproxen, Naprosyn and Aspirin based products such as Excedrin, Goody's Powder, BC Powder. You may however, continue to take Tylenol if needed for pain up until the day of surgery.  Stop ANY OVER THE COUNTER supplements until after surgery.  Hold Plavix - per patient stop taking on 04/09/23.  Hold Losartan on the day of surgery.  HOLD FARXIGA beginning 04/10/23.  HOLD tadalafil (CIALIS) beginning 04/11/23.   ON THE DAY OF SURGERY ONLY TAKE THESE  MEDICATIONS WITH SIPS OF WATER:  azelastine (ASTELIN)  gabapentin (NEURONTIN)  levothyroxine (SYNTHROID)  omeprazole (PRILOSEC)   No Alcohol for 24 hours before or after surgery.  No Smoking including e-cigarettes for 24 hours before surgery.  No chewable tobacco products for at least 6 hours before surgery.  No nicotine patches on the day of surgery.  Do not use any "recreational" drugs for at least a week (preferably 2 weeks) before your surgery.  Please be advised that the combination of cocaine and anesthesia may have negative outcomes, up to and including death. If you test positive for cocaine, your surgery will be cancelled.  On the morning of surgery brush your teeth with toothpaste and water, you may rinse your mouth with mouthwash if you wish. Do not swallow any toothpaste or mouthwash.  Use CHG Soap or wipes as directed on instruction sheet.  Do not wear jewelry, make-up, hairpins, clips or nail polish.  For welded (permanent) jewelry: bracelets, anklets, waist bands, etc.  Please have this removed prior to surgery.  If it is not removed, there is a chance that hospital personnel will need to cut it off on the day of surgery.  Do not wear lotions, powders, or perfumes.   Do not shave body hair from the neck down 48 hours before surgery.  Contact lenses, hearing aids and dentures may not be worn into surgery.  Do not bring valuables to the hospital. [facility] is not responsible for any missing/lost belongings or valuables.   Total Shoulder Arthroplasty:  use Benzoyl  Peroxide 5% Gel as directed on instruction sheet.  Notify your doctor if there is any change in your medical condition (cold, fever, infection).  Wear comfortable clothing (specific to your surgery type) to the hospital.  After surgery, you can help prevent lung complications by doing breathing exercises.  Take deep breaths and cough every 1-2 hours. Your doctor may order a device called an Incentive  Spirometer to help you take deep breaths. When coughing or sneezing, hold a pillow firmly against your incision with both hands. This is called "splinting." Doing this helps protect your incision. It also decreases belly discomfort.  If you are being admitted to the hospital overnight, leave your suitcase in the car. After surgery it may be brought to your room.  In case of increased patient census, it may be necessary for you, the patient, to continue your postoperative care in the Same Day Surgery department.  If you are being discharged the day of surgery, you will not be allowed to drive home. You will need a responsible individual to drive you home and stay with you for 24 hours after surgery.   If you are taking public transportation, you will need to have a responsible individual with you.  Please call the Pre-admissions Testing Dept. at [phone] if you have any questions about these instructions.  Surgery Visitation Policy:  Patients having surgery or a procedure may have two visitors.  Children under the age of 100 must have an adult with them who is not the patient.  Inpatient Visitation:    Visiting hours are 7 a.m. to 8 p.m. Up to four visitors are allowed at one time in a patient room. The visitors may rotate out with other people during the day.  One visitor age 44 or older may stay with the patient overnight and must be in the room by 8 p.m.    Pre-operative 5 CHG Bath Instructions   You can play a key role in reducing the risk of infection after surgery. Your skin needs to be as free of germs as possible. You can reduce the number of germs on your skin by washing with CHG (chlorhexidine gluconate) soap before surgery. CHG is an antiseptic soap that kills germs and continues to kill germs even after washing.   DO NOT use if you have an allergy to chlorhexidine/CHG or antibacterial soaps. If your skin becomes reddened or irritated, stop using the CHG and notify one  of our RNs at [phone].   Please shower with the CHG soap starting 4 days before surgery using the following schedule: 11/04 - 11/07.  Please keep in mind the following:  DO NOT shave, including legs and underarms, starting the day of your first shower.   You may shave your face at any point before/day of surgery.  Place clean sheets on your bed the day you start using CHG soap. Use a clean washcloth (not used since being washed) for each shower. DO NOT sleep with pets once you start using the CHG.   CHG Shower Instructions:  If you choose to wash your hair and private area, wash first with your normal shampoo/soap.  After you use shampoo/soap, rinse your hair and body thoroughly to remove shampoo/soap residue.  Turn the water OFF and apply about 3 tablespoons (45 ml) of CHG soap to a CLEAN washcloth.  Apply CHG soap ONLY FROM YOUR NECK DOWN TO YOUR TOES (washing for 3-5 minutes)  DO NOT use CHG soap on face, private areas,  open wounds, or sores.  Pay special attention to the area where your surgery is being performed.  If you are having back surgery, having someone wash your back for you may be helpful. Wait 2 minutes after CHG soap is applied, then you may rinse off the CHG soap.  Pat dry with a clean towel  Put on clean clothes/pajamas   If you choose to wear lotion, please use ONLY the CHG-compatible lotions on the back of this paper.     Additional instructions for the day of surgery: DO NOT APPLY any lotions, deodorants, cologne, or perfumes.   Put on clean/comfortable clothes.  Brush your teeth.  Ask your nurse before applying any prescription medications to the skin.      CHG Compatible Lotions   Aveeno Moisturizing lotion  Cetaphil Moisturizing Cream  Cetaphil Moisturizing Lotion  Clairol Herbal Essence Moisturizing Lotion, Dry Skin  Clairol Herbal Essence Moisturizing Lotion, Extra Dry Skin  Clairol Herbal Essence Moisturizing Lotion, Normal Skin  Curel Age  Defying Therapeutic Moisturizing Lotion with Alpha Hydroxy  Curel Extreme Care Body Lotion  Curel Soothing Hands Moisturizing Hand Lotion  Curel Therapeutic Moisturizing Cream, Fragrance-Free  Curel Therapeutic Moisturizing Lotion, Fragrance-Free  Curel Therapeutic Moisturizing Lotion, Original Formula  Eucerin Daily Replenishing Lotion  Eucerin Dry Skin Therapy Plus Alpha Hydroxy Crme  Eucerin Dry Skin Therapy Plus Alpha Hydroxy Lotion  Eucerin Original Crme  Eucerin Original Lotion  Eucerin Plus Crme Eucerin Plus Lotion  Eucerin TriLipid Replenishing Lotion  Keri Anti-Bacterial Hand Lotion  Keri Deep Conditioning Original Lotion Dry Skin Formula Softly Scented  Keri Deep Conditioning Original Lotion, Fragrance Free Sensitive Skin Formula  Keri Lotion Fast Absorbing Fragrance Free Sensitive Skin Formula  Keri Lotion Fast Absorbing Softly Scented Dry Skin Formula  Keri Original Lotion  Keri Skin Renewal Lotion Keri Silky Smooth Lotion  Keri Silky Smooth Sensitive Skin Lotion  Nivea Body Creamy Conditioning Oil  Nivea Body Extra Enriched Lotion  Nivea Body Original Lotion  Nivea Body Sheer Moisturizing Lotion Nivea Crme  Nivea Skin Firming Lotion  NutraDerm 30 Skin Lotion  NutraDerm Skin Lotion  NutraDerm Therapeutic Skin Cream  NutraDerm Therapeutic Skin Lotion  ProShield Protective Hand Cream  Provon moisturizing lotion  Preparing for Total Shoulder Arthroplasty  Before surgery, you can play an important role by reducing the number of germs on your skin by using the following products:  Benzoyl Peroxide Gel  o Reduces the number of germs present on the skin  o Applied twice a day to shoulder area starting two days before surgery  Chlorhexidine Gluconate (CHG) Soap  o An antiseptic cleaner that kills germs and bonds with the skin to continue killing germs even after washing  o Used for showering the night before surgery and morning of surgery  BENZOYL PEROXIDE 5%  GEL  Please do not use if you have an allergy to benzoyl peroxide. If your skin becomes reddened/irritated stop using the benzoyl peroxide.  Starting two days before surgery, apply as follows:  1. Apply benzoyl peroxide in the morning and at night. Apply after taking a shower. If you are not taking a shower, clean entire shoulder front, back, and side along with the armpit with a clean wet washcloth.  2. Place a quarter-sized dollop on your shoulder and rub in thoroughly, making sure to cover the front, back, and side of your shoulder, along with the armpit beginning 11/05.    3. Do this twice a day for two days on 11/05 and  11/06. (Last application is the night before surgery, AFTER using the CHG soap).  4. Do NOT apply benzoyl peroxide gel on the day of surgery.   How to Use an Incentive Spirometer  An incentive spirometer is a tool that measures how well you are filling your lungs with each breath. Learning to take long, deep breaths using this tool can help you keep your lungs clear and active. This may help to reverse or lessen your chance of developing breathing (pulmonary) problems, especially infection. You may be asked to use a spirometer: After a surgery. If you have a lung problem or a history of smoking. After a long period of time when you have been unable to move or be active. If the spirometer includes an indicator to show the highest number that you have reached, your health care provider or respiratory therapist will help you set a goal. Keep a log of your progress as told by your health care provider. What are the risks? Breathing too quickly may cause dizziness or cause you to pass out. Take your time so you do not get dizzy or light-headed. If you are in pain, you may need to take pain medicine before doing incentive spirometry. It is harder to take a deep breath if you are having pain. How to use your incentive spirometer  Sit up on the edge of your bed or on a  chair. Hold the incentive spirometer so that it is in an upright position. Before you use the spirometer, breathe out normally. Place the mouthpiece in your mouth. Make sure your lips are closed tightly around it. Breathe in slowly and as deeply as you can through your mouth, causing the piston or the ball to rise toward the top of the chamber. Hold your breath for 3-5 seconds, or for as long as possible. If the spirometer includes a coach indicator, use this to guide you in breathing. Slow down your breathing if the indicator goes above the marked areas. Remove the mouthpiece from your mouth and breathe out normally. The piston or ball will return to the bottom of the chamber. Rest for a few seconds, then repeat the steps 10 or more times. Take your time and take a few normal breaths between deep breaths so that you do not get dizzy or light-headed. Do this every 1-2 hours when you are awake. If the spirometer includes a goal marker to show the highest number you have reached (best effort), use this as a goal to work toward during each repetition. After each set of 10 deep breaths, cough a few times. This will help to make sure that your lungs are clear. If you have an incision on your chest or abdomen from surgery, place a pillow or a rolled-up towel firmly against the incision when you cough. This can help to reduce pain while taking deep breaths and coughing. General tips When you are able to get out of bed: Walk around often. Continue to take deep breaths and cough in order to clear your lungs. Keep using the incentive spirometer until your health care provider says it is okay to stop using it. If you have been in the hospital, you may be told to keep using the spirometer at home. Contact a health care provider if: You are having difficulty using the spirometer. You have trouble using the spirometer as often as instructed. Your pain medicine is not giving enough relief for you to use the  spirometer as told. You have a  fever. Get help right away if: You develop shortness of breath. You develop a cough with bloody mucus from the lungs. You have fluid or blood coming from an incision site after you cough. Summary An incentive spirometer is a tool that can help you learn to take long, deep breaths to keep your lungs clear and active. You may be asked to use a spirometer after a surgery, if you have a lung problem or a history of smoking, or if you have been inactive for a long period of time. Use your incentive spirometer as instructed every 1-2 hours while you are awake. If you have an incision on your chest or abdomen, place a pillow or a rolled-up towel firmly against your incision when you cough. This will help to reduce pain. Get help right away if you have shortness of breath, you cough up bloody mucus, or blood comes from your incision when you cough. This information is not intended to replace advice given to you by your health care provider. Make sure you discuss any questions you have with your health care provider. Document Revised: 08/12/2019 Document Reviewed: 08/12/2019 Elsevier Patient Education  2023 Elsevier [employer].    POLAR CARE INFORMATION  MassAdvertisement.it  How to use [facility] Therapy System?  YouTube   ShippingScam.co.uk  OPERATING INSTRUCTIONS  Start the product With dry hands, connect the transformer to the electrical connection located on the top of the cooler. Next, plug the transformer into an appropriate electrical outlet. The unit will automatically start running at this point.  To stop the pump, disconnect electrical power.  Unplug to stop the product when not in use. Unplugging the Polar Care unit turns it off. Always unplug immediately after use. Never leave it plugged in while unattended. Remove pad.    FIRST ADD WATER TO FILL LINE, THEN ICE---Replace ice when existing ice is almost melted  1 Discuss  Treatment with your Licensed Health Care Practitioner and Use Only as Prescribed 2 Apply Insulation Barrier & Cold Therapy Pad 3 Check for Moisture 4 Inspect Skin Regularly  Tips and Trouble Shooting Usage Tips 1. Use cubed or chunked ice for optimal performance. 2. It is recommended to drain the Pad between uses. To drain the pad, hold the Pad upright with the hose pointed toward the ground. Depress the black plunger and allow water to drain out. 3. You may disconnect the Pad from the unit without removing the pad from the affected area by depressing the silver tabs on the hose coupling and gently pulling the hoses apart. The Pad and unit will seal itself and will not leak. Note: Some dripping during release is normal. 4. DO NOT RUN PUMP WITHOUT WATER! The pump in this unit is designed to run with water. Running the unit without water will cause permanent damage to the pump. 5. Unplug unit before removing lid.  TROUBLESHOOTING GUIDE Pump not running, Water not flowing to the pad, Pad is not getting cold 1. Make sure the transformer is plugged into the wall outlet. 2. Confirm that the ice and water are filled to the indicated levels. 3. Make sure there are no kinks in the pad. 4. Gently pull on the blue tube to make sure the tube/pad junction is straight. 5. Remove the pad from the treatment site and ll it while the pad is lying at; then reapply. 6. Confirm that the pad couplings are securely attached to the unit. Listen for the double clicks (Figure 1) to confirm  the pad couplings are securely attached.  Leaks    Note: Some condensation on the lines, controller, and pads is unavoidable, especially in warmer climates. 1. If using a [employer] Polar Care Cold Therapy unit with a detachable Cold Therapy Pad, and a leak exists (other than condensation on the lines) disconnect the pad couplings. Make sure the silver tabs on the couplings are depressed before reconnecting the pad to the pump hose; then  confirm both sides of the coupling are properly clicked in. 2. If the coupling continues to leak or a leak is detected in the pad itself, stop using it and call [employer] Customer Care at [phone].  Cleaning After use, empty and dry the unit with a soft cloth. Warm water and mild detergent may be used occasionally to clean the pump and tubes.  WARNING: The Polar Care Cube can be cold enough to cause serious injury, including full skin necrosis. Follow these Operating Instructions, and carefully read the Product Insert (see pouch on side of unit) and the Cold Therapy Pad Fitting Instructions (provided with each Cold Therapy Pad) prior to use.

## 2023-04-12 ENCOUNTER — Inpatient Hospital Stay: Payer: Medicare Other

## 2023-04-12 ENCOUNTER — Inpatient Hospital Stay: Payer: Medicare Other | Attending: Oncology

## 2023-04-12 VITALS — BP 120/68

## 2023-04-12 DIAGNOSIS — D631 Anemia in chronic kidney disease: Secondary | ICD-10-CM | POA: Diagnosis not present

## 2023-04-12 DIAGNOSIS — I129 Hypertensive chronic kidney disease with stage 1 through stage 4 chronic kidney disease, or unspecified chronic kidney disease: Secondary | ICD-10-CM | POA: Diagnosis not present

## 2023-04-12 DIAGNOSIS — D7581 Myelofibrosis: Secondary | ICD-10-CM

## 2023-04-12 DIAGNOSIS — N184 Chronic kidney disease, stage 4 (severe): Secondary | ICD-10-CM | POA: Insufficient documentation

## 2023-04-12 LAB — HEMOGLOBIN AND HEMATOCRIT (CANCER CENTER ONLY)
HCT: 30.7 % — ABNORMAL LOW (ref 39.0–52.0)
Hemoglobin: 9.4 g/dL — ABNORMAL LOW (ref 13.0–17.0)

## 2023-04-12 MED ORDER — TRANEXAMIC ACID-NACL 1000-0.7 MG/100ML-% IV SOLN
1000.0000 mg | INTRAVENOUS | Status: AC
  Administered 2023-04-13 (×2): 1000 mg via INTRAVENOUS

## 2023-04-12 MED ORDER — EPOETIN ALFA-EPBX 40000 UNIT/ML IJ SOLN
40000.0000 [IU] | Freq: Once | INTRAMUSCULAR | Status: AC
Start: 2023-04-12 — End: 2023-04-12
  Administered 2023-04-12: 40000 [IU] via SUBCUTANEOUS
  Filled 2023-04-12: qty 1

## 2023-04-12 MED ORDER — ORAL CARE MOUTH RINSE: 15.0000 mL | Freq: Once | OROMUCOSAL | Status: AC

## 2023-04-12 MED ORDER — CHLORHEXIDINE GLUCONATE 0.12 % MT SOLN
15.0000 mL | Freq: Once | OROMUCOSAL | Status: AC
  Administered 2023-04-13: 15 mL via OROMUCOSAL

## 2023-04-12 MED ORDER — LACTATED RINGERS IV SOLN: INTRAVENOUS | Status: DC

## 2023-04-12 MED ORDER — EPOETIN ALFA-EPBX 10000 UNIT/ML IJ SOLN
10000.0000 [IU] | Freq: Once | INTRAMUSCULAR | Status: AC
Start: 2023-04-12 — End: 2023-04-12
  Administered 2023-04-12: 10000 [IU] via SUBCUTANEOUS
  Filled 2023-04-12: qty 1

## 2023-04-12 MED ORDER — CEFAZOLIN SODIUM-DEXTROSE 2-4 GM/100ML-% IV SOLN
2.0000 g | INTRAVENOUS | Status: AC
  Administered 2023-04-13: 2 g via INTRAVENOUS

## 2023-04-13 ENCOUNTER — Ambulatory Visit: Payer: Self-pay | Admitting: Urgent Care

## 2023-04-13 ENCOUNTER — Ambulatory Visit
Admission: RE | Admit: 2023-04-13 | Discharge: 2023-04-13 | Disposition: A | Discharge status: Home / Self Care | Payer: Medicare Other | Source: Ambulatory Visit | Attending: Surgery | Admitting: Surgery

## 2023-04-13 ENCOUNTER — Other Ambulatory Visit: Payer: Self-pay

## 2023-04-13 ENCOUNTER — Encounter
Admission: RE | Disposition: A | Discharge status: Home / Self Care | Payer: Self-pay | Source: Ambulatory Visit | Attending: Surgery

## 2023-04-13 ENCOUNTER — Encounter: Payer: Self-pay | Admitting: Surgery

## 2023-04-13 ENCOUNTER — Ambulatory Visit: Payer: Medicare Other

## 2023-04-13 ENCOUNTER — Ambulatory Visit: Payer: Medicare Other | Admitting: Urgent Care

## 2023-04-13 DIAGNOSIS — I440 Atrioventricular block, first degree: Secondary | ICD-10-CM | POA: Diagnosis not present

## 2023-04-13 DIAGNOSIS — Z471 Aftercare following joint replacement surgery: Secondary | ICD-10-CM | POA: Diagnosis not present

## 2023-04-13 DIAGNOSIS — Z96611 Presence of right artificial shoulder joint: Secondary | ICD-10-CM | POA: Diagnosis not present

## 2023-04-13 DIAGNOSIS — S46811A Strain of other muscles, fascia and tendons at shoulder and upper arm level, right arm, initial encounter: Secondary | ICD-10-CM

## 2023-04-13 DIAGNOSIS — Z87891 Personal history of nicotine dependence: Secondary | ICD-10-CM | POA: Insufficient documentation

## 2023-04-13 DIAGNOSIS — K219 Gastro-esophageal reflux disease without esophagitis: Secondary | ICD-10-CM | POA: Diagnosis not present

## 2023-04-13 DIAGNOSIS — Y9315 Activity, underwater diving and snorkeling: Secondary | ICD-10-CM | POA: Diagnosis not present

## 2023-04-13 DIAGNOSIS — S46011A Strain of muscle(s) and tendon(s) of the rotator cuff of right shoulder, initial encounter: Secondary | ICD-10-CM | POA: Diagnosis not present

## 2023-04-13 DIAGNOSIS — M75101 Unspecified rotator cuff tear or rupture of right shoulder, not specified as traumatic: Secondary | ICD-10-CM | POA: Diagnosis present

## 2023-04-13 DIAGNOSIS — I10 Essential (primary) hypertension: Secondary | ICD-10-CM | POA: Diagnosis not present

## 2023-04-13 DIAGNOSIS — B356 Tinea cruris: Secondary | ICD-10-CM

## 2023-04-13 DIAGNOSIS — E039 Hypothyroidism, unspecified: Secondary | ICD-10-CM | POA: Diagnosis not present

## 2023-04-13 DIAGNOSIS — X58XXXA Exposure to other specified factors, initial encounter: Secondary | ICD-10-CM | POA: Diagnosis not present

## 2023-04-13 DIAGNOSIS — M19011 Primary osteoarthritis, right shoulder: Secondary | ICD-10-CM | POA: Diagnosis not present

## 2023-04-13 DIAGNOSIS — M12811 Other specific arthropathies, not elsewhere classified, right shoulder: Secondary | ICD-10-CM | POA: Diagnosis not present

## 2023-04-13 DIAGNOSIS — M7581 Other shoulder lesions, right shoulder: Secondary | ICD-10-CM | POA: Diagnosis not present

## 2023-04-13 DIAGNOSIS — Z7902 Long term (current) use of antithrombotics/antiplatelets: Secondary | ICD-10-CM | POA: Insufficient documentation

## 2023-04-13 DIAGNOSIS — G8918 Other acute postprocedural pain: Secondary | ICD-10-CM | POA: Diagnosis not present

## 2023-04-13 DIAGNOSIS — W228XXA Striking against or struck by other objects, initial encounter: Secondary | ICD-10-CM | POA: Diagnosis not present

## 2023-04-13 HISTORY — PX: REVERSE SHOULDER ARTHROPLASTY: SHX5054

## 2023-04-13 SURGERY — ARTHROPLASTY, SHOULDER, TOTAL, REVERSE
Anesthesia: General | Site: Shoulder | Laterality: Right

## 2023-04-13 MED ORDER — KETOROLAC TROMETHAMINE 15 MG/ML IJ SOLN
INTRAMUSCULAR | Status: AC
  Filled 2023-04-13: qty 1

## 2023-04-13 MED ORDER — FENTANYL CITRATE (PF) 100 MCG/2ML IJ SOLN: 25.0000 ug | INTRAMUSCULAR | Status: DC | PRN

## 2023-04-13 MED ORDER — PROPOFOL 10 MG/ML IV BOLUS
INTRAVENOUS | Status: DC | PRN
  Administered 2023-04-13: 90 mg via INTRAVENOUS

## 2023-04-13 MED ORDER — TRANEXAMIC ACID-NACL 1000-0.7 MG/100ML-% IV SOLN
INTRAVENOUS | Status: AC
  Filled 2023-04-13: qty 100

## 2023-04-13 MED ORDER — OXYCODONE HCL 5 MG PO TABS: 5.0000 mg | ORAL_TABLET | ORAL | Status: DC | PRN

## 2023-04-13 MED ORDER — EPHEDRINE 5 MG/ML INJ
INTRAVENOUS | Status: AC
  Filled 2023-04-13: qty 5

## 2023-04-13 MED ORDER — ONDANSETRON HCL 4 MG/2ML IJ SOLN
INTRAMUSCULAR | Status: AC
  Filled 2023-04-13: qty 2

## 2023-04-13 MED ORDER — BUPIVACAINE HCL (PF) 0.5 % IJ SOLN
INTRAMUSCULAR | Status: AC
  Filled 2023-04-13: qty 10

## 2023-04-13 MED ORDER — LACTATED RINGERS IV SOLN: INTRAVENOUS | Status: DC | PRN

## 2023-04-13 MED ORDER — OXYCODONE HCL 5 MG/5ML PO SOLN: 5.0000 mg | Freq: Once | ORAL | Status: DC | PRN

## 2023-04-13 MED ORDER — ROCURONIUM BROMIDE 10 MG/ML (PF) SYRINGE
PREFILLED_SYRINGE | INTRAVENOUS | Status: AC
  Filled 2023-04-13: qty 10

## 2023-04-13 MED ORDER — BUPIVACAINE-EPINEPHRINE (PF) 0.5% -1:200000 IJ SOLN
INTRAMUSCULAR | Status: AC
Start: 2023-04-13 — End: ?
  Filled 2023-04-13: qty 30

## 2023-04-13 MED ORDER — TERBINAFINE HCL 250 MG PO TABS: 250.0000 mg | ORAL_TABLET | Freq: Every evening | ORAL | Status: DC

## 2023-04-13 MED ORDER — BUPIVACAINE LIPOSOME 1.3 % IJ SUSP
INTRAMUSCULAR | Status: DC | PRN
  Administered 2023-04-13: 20 mL via PERINEURAL

## 2023-04-13 MED ORDER — FENTANYL CITRATE PF 50 MCG/ML IJ SOSY
PREFILLED_SYRINGE | INTRAMUSCULAR | Status: AC
  Filled 2023-04-13: qty 1

## 2023-04-13 MED ORDER — FENTANYL CITRATE (PF) 100 MCG/2ML IJ SOLN
INTRAMUSCULAR | Status: AC
  Filled 2023-04-13: qty 2

## 2023-04-13 MED ORDER — PHENYLEPHRINE HCL-NACL 20-0.9 MG/250ML-% IV SOLN
INTRAVENOUS | Status: DC | PRN
  Administered 2023-04-13: 30 ug/min via INTRAVENOUS

## 2023-04-13 MED ORDER — DEXAMETHASONE SODIUM PHOSPHATE 10 MG/ML IJ SOLN
INTRAMUSCULAR | Status: DC | PRN
  Administered 2023-04-13: 8 mg via INTRAVENOUS

## 2023-04-13 MED ORDER — PHENYLEPHRINE 80 MCG/ML (10ML) SYRINGE FOR IV PUSH (FOR BLOOD PRESSURE SUPPORT)
PREFILLED_SYRINGE | INTRAVENOUS | Status: DC | PRN
  Administered 2023-04-13: 80 ug via INTRAVENOUS

## 2023-04-13 MED ORDER — DROPERIDOL 2.5 MG/ML IJ SOLN: 0.6250 mg | Freq: Once | INTRAMUSCULAR | Status: DC | PRN

## 2023-04-13 MED ORDER — BUPIVACAINE LIPOSOME 1.3 % IJ SUSP
INTRAMUSCULAR | Status: AC
  Filled 2023-04-13: qty 20

## 2023-04-13 MED ORDER — METOCLOPRAMIDE HCL 10 MG PO TABS: 5.0000 mg | ORAL_TABLET | Freq: Three times a day (TID) | ORAL | Status: DC | PRN

## 2023-04-13 MED ORDER — FENTANYL CITRATE (PF) 100 MCG/2ML IJ SOLN
INTRAMUSCULAR | Status: DC | PRN
  Administered 2023-04-13: 25 ug via INTRAVENOUS
  Administered 2023-04-13: 50 ug via INTRAVENOUS

## 2023-04-13 MED ORDER — EPHEDRINE SULFATE-NACL 50-0.9 MG/10ML-% IV SOSY
PREFILLED_SYRINGE | INTRAVENOUS | Status: DC | PRN
  Administered 2023-04-13: 5 mg via INTRAVENOUS
  Administered 2023-04-13: 10 mg via INTRAVENOUS
  Administered 2023-04-13: 5 mg via INTRAVENOUS

## 2023-04-13 MED ORDER — CEFAZOLIN SODIUM-DEXTROSE 2-4 GM/100ML-% IV SOLN: 2.0000 g | Freq: Four times a day (QID) | INTRAVENOUS | Status: DC

## 2023-04-13 MED ORDER — PROPOFOL 10 MG/ML IV BOLUS
INTRAVENOUS | Status: AC
Start: 2023-04-13 — End: ?
  Filled 2023-04-13: qty 20

## 2023-04-13 MED ORDER — SODIUM CHLORIDE 0.9 % IR SOLN
Status: DC | PRN
  Administered 2023-04-13: 3000 mL

## 2023-04-13 MED ORDER — ONDANSETRON HCL 4 MG/2ML IJ SOLN
INTRAMUSCULAR | Status: DC | PRN
  Administered 2023-04-13: 4 mg via INTRAVENOUS

## 2023-04-13 MED ORDER — PHENYLEPHRINE HCL-NACL 20-0.9 MG/250ML-% IV SOLN
INTRAVENOUS | Status: AC
  Filled 2023-04-13: qty 250

## 2023-04-13 MED ORDER — METOCLOPRAMIDE HCL 5 MG/ML IJ SOLN: 5.0000 mg | Freq: Three times a day (TID) | INTRAMUSCULAR | Status: DC | PRN

## 2023-04-13 MED ORDER — DEXAMETHASONE SODIUM PHOSPHATE 10 MG/ML IJ SOLN
INTRAMUSCULAR | Status: AC
  Filled 2023-04-13: qty 1

## 2023-04-13 MED ORDER — DULOXETINE HCL 20 MG PO CPEP: 20.0000 mg | ORAL_CAPSULE | Freq: Every day | ORAL | Status: DC

## 2023-04-13 MED ORDER — ONDANSETRON HCL 4 MG/2ML IJ SOLN: 4.0000 mg | Freq: Four times a day (QID) | INTRAMUSCULAR | Status: DC | PRN

## 2023-04-13 MED ORDER — OXYCODONE HCL 5 MG PO TABS
5.0000 mg | ORAL_TABLET | ORAL | 0 refills | Status: DC | PRN
Start: 2023-04-13 — End: 2023-05-09

## 2023-04-13 MED ORDER — OMEPRAZOLE 40 MG PO CPDR: 40.0000 mg | DELAYED_RELEASE_CAPSULE | Freq: Every day | ORAL | Status: DC

## 2023-04-13 MED ORDER — CHLORHEXIDINE GLUCONATE 0.12 % MT SOLN
OROMUCOSAL | Status: AC
  Filled 2023-04-13: qty 15

## 2023-04-13 MED ORDER — LIDOCAINE HCL (PF) 2 % IJ SOLN
INTRAMUSCULAR | Status: AC
  Filled 2023-04-13: qty 5

## 2023-04-13 MED ORDER — BUPIVACAINE HCL (PF) 0.5 % IJ SOLN
INTRAMUSCULAR | Status: DC | PRN
  Administered 2023-04-13: 50 mL via PERINEURAL

## 2023-04-13 MED ORDER — TAMSULOSIN HCL 0.4 MG PO CAPS: 0.8000 mg | ORAL_CAPSULE | Freq: Every evening | ORAL | Status: DC

## 2023-04-13 MED ORDER — ONDANSETRON HCL 4 MG PO TABS: 4.0000 mg | ORAL_TABLET | Freq: Four times a day (QID) | ORAL | Status: DC | PRN

## 2023-04-13 MED ORDER — KETOROLAC TROMETHAMINE 15 MG/ML IJ SOLN
15.0000 mg | Freq: Once | INTRAMUSCULAR | Status: AC
  Administered 2023-04-13: 15 mg via INTRAVENOUS

## 2023-04-13 MED ORDER — SUGAMMADEX SODIUM 200 MG/2ML IV SOLN
INTRAVENOUS | Status: DC | PRN
  Administered 2023-04-13: 200 mg via INTRAVENOUS

## 2023-04-13 MED ORDER — CEFAZOLIN SODIUM-DEXTROSE 2-4 GM/100ML-% IV SOLN
INTRAVENOUS | Status: AC
  Filled 2023-04-13: qty 100

## 2023-04-13 MED ORDER — OXYCODONE HCL 5 MG PO TABS: 5.0000 mg | ORAL_TABLET | Freq: Once | ORAL | Status: DC | PRN

## 2023-04-13 MED ORDER — ACETAMINOPHEN 10 MG/ML IV SOLN: 1000.0000 mg | Freq: Once | INTRAVENOUS | Status: DC | PRN

## 2023-04-13 MED ORDER — DEXTROSE-SODIUM CHLORIDE 5-0.9 % IV SOLN: INTRAVENOUS | Status: DC

## 2023-04-13 MED ORDER — BUPIVACAINE-EPINEPHRINE (PF) 0.5% -1:200000 IJ SOLN
INTRAMUSCULAR | Status: DC | PRN
  Administered 2023-04-13: 30 mL

## 2023-04-13 MED ORDER — CEFAZOLIN SODIUM-DEXTROSE 2-4 GM/100ML-% IV SOLN
INTRAVENOUS | Status: AC
Start: 2023-04-13 — End: ?
  Filled 2023-04-13: qty 100

## 2023-04-13 MED ORDER — CEFAZOLIN SODIUM-DEXTROSE 2-4 GM/100ML-% IV SOLN
2.0000 g | Freq: Three times a day (TID) | INTRAVENOUS | Status: DC
  Administered 2023-04-13: 2 g via INTRAVENOUS

## 2023-04-13 MED ORDER — ACETAMINOPHEN 325 MG PO TABS: 325.0000 mg | ORAL_TABLET | Freq: Four times a day (QID) | ORAL | Status: DC | PRN

## 2023-04-13 MED ORDER — ROCURONIUM BROMIDE 100 MG/10ML IV SOLN
INTRAVENOUS | Status: DC | PRN
  Administered 2023-04-13: 50 mg via INTRAVENOUS

## 2023-04-13 MED ORDER — LIDOCAINE HCL (CARDIAC) PF 100 MG/5ML IV SOSY
PREFILLED_SYRINGE | INTRAVENOUS | Status: DC | PRN
  Administered 2023-04-13: 40 mg via INTRAVENOUS

## 2023-04-13 SURGICAL SUPPLY — 72 items
APL PRP STRL LF DISP 70% ISPRP (MISCELLANEOUS) ×3
BIT DRILL FLUTED 3.0 STRL (BIT) IMPLANT
BLADE SAW SAG 25X90X1.19 (BLADE) ×1 IMPLANT
BNDG CMPR 5X4 CHSV STRCH STRL (GAUZE/BANDAGES/DRESSINGS) ×1
BNDG COHESIVE 4X5 TAN STRL LF (GAUZE/BANDAGES/DRESSINGS) IMPLANT
BSPLAT GLND +2X24 MDLR (Joint) ×1 IMPLANT
CALIBRATOR GLENOID VIP 5-D (SYSTAGENIX WOUND MANAGEMENT) IMPLANT
CHLORAPREP W/TINT 26 (MISCELLANEOUS) ×1 IMPLANT
COOLER POLAR GLACIER W/PUMP (MISCELLANEOUS) ×1 IMPLANT
CUP SUT UNIV REVERS 36 NEUTRAL (Cup) IMPLANT
DRAPE INCISE IOBAN 66X45 STRL (DRAPES) ×1 IMPLANT
DRAPE SHEET LG 3/4 BI-LAMINATE (DRAPES) ×1 IMPLANT
DRAPE TABLE BACK 80X90 (DRAPES) ×1 IMPLANT
DRSG OPSITE POSTOP 4X8 (GAUZE/BANDAGES/DRESSINGS) ×1 IMPLANT
ELECT BLADE 6.5 EXT (BLADE) IMPLANT
ELECT CAUTERY BLADE 6.4 (BLADE) ×1 IMPLANT
ELECT REM PT RETURN 9FT ADLT (ELECTROSURGICAL) ×1
ELECTRODE REM PT RTRN 9FT ADLT (ELECTROSURGICAL) ×1 IMPLANT
GAUZE XEROFORM 1X8 LF (GAUZE/BANDAGES/DRESSINGS) ×1 IMPLANT
GLENOID UNI REV MOD 24 +2 LAT (Joint) IMPLANT
GLENOSPHERE 39+4 LAT/24 UNI RV (Joint) IMPLANT
GLOVE BIO SURGEON STRL SZ7.5 (GLOVE) ×4 IMPLANT
GLOVE BIO SURGEON STRL SZ8 (GLOVE) ×4 IMPLANT
GLOVE BIOGEL PI IND STRL 8 (GLOVE) ×2 IMPLANT
GLOVE INDICATOR 8.0 STRL GRN (GLOVE) ×1 IMPLANT
GOWN STRL REUS W/ TWL LRG LVL3 (GOWN DISPOSABLE) ×1 IMPLANT
GOWN STRL REUS W/ TWL XL LVL3 (GOWN DISPOSABLE) ×1 IMPLANT
GOWN STRL REUS W/TWL LRG LVL3 (GOWN DISPOSABLE) ×1
GOWN STRL REUS W/TWL XL LVL3 (GOWN DISPOSABLE) ×2
HANDLE YANKAUER SUCT OPEN TIP (MISCELLANEOUS) ×1 IMPLANT
HOOD PEEL AWAY T7 (MISCELLANEOUS) ×3 IMPLANT
INSERT HUMERAL COMBO 36 +6/39 (Joint) IMPLANT
IV NS IRRIG 3000ML ARTHROMATIC (IV SOLUTION) ×1 IMPLANT
KIT STABILIZATION SHOULDER (MISCELLANEOUS) ×1 IMPLANT
KIT TURNOVER KIT A (KITS) ×1 IMPLANT
MANIFOLD NEPTUNE II (INSTRUMENTS) ×1 IMPLANT
MASK FACE SPIDER DISP (MASK) ×1 IMPLANT
MAT ABSORB FLUID 56X50 GRAY (MISCELLANEOUS) ×1 IMPLANT
NDL MAYO CATGUT SZ1 (NEEDLE) IMPLANT
NDL SAFETY ECLIPSE 18X1.5 (NEEDLE) ×1 IMPLANT
NDL SPNL 20GX3.5 QUINCKE YW (NEEDLE) ×1 IMPLANT
NEEDLE MAYO CATGUT SZ1 (NEEDLE) IMPLANT
NEEDLE SPNL 20GX3.5 QUINCKE YW (NEEDLE) ×1 IMPLANT
NS IRRIG 500ML POUR BTL (IV SOLUTION) ×1 IMPLANT
PACK ARTHROSCOPY SHOULDER (MISCELLANEOUS) ×1 IMPLANT
PAD ARMBOARD 7.5X6 YLW CONV (MISCELLANEOUS) ×1 IMPLANT
PAD WRAPON POLAR SHDR UNIV (MISCELLANEOUS) ×1 IMPLANT
PIN NITINOL TARGETER 2.8 (PIN) IMPLANT
PULSAVAC PLUS IRRIG FAN TIP (DISPOSABLE) ×1
SCREW CENTRAL MOD 30MM (Screw) IMPLANT
SCREW GLENOSPHERE (Miscellaneous) IMPLANT
SCREW PERI LOCK 5.5X24 (Screw) IMPLANT
SCREW PERI LOCK 5.5X36 (Screw) IMPLANT
SCREW PERIPHERAL 5.5X20 LOCK (Screw) IMPLANT
SCREW PERIPHERAL 5.5X28 LOCK (Screw) IMPLANT
SLING ULTRA II M (MISCELLANEOUS) IMPLANT
SPONGE T-LAP 18X18 ~~LOC~~+RFID (SPONGE) ×2 IMPLANT
STAPLER SKIN PROX 35W (STAPLE) ×1 IMPLANT
STEM HUM UNIV REV 9 (Stem) IMPLANT
SUT ETHIBOND 0 MO6 C/R (SUTURE) ×1 IMPLANT
SUT FIBERWIRE #2 38 BLUE 1/2 (SUTURE) ×4
SUT VIC AB 0 CT1 36 (SUTURE) ×1 IMPLANT
SUT VIC AB 2-0 CT1 27 (SUTURE) ×2
SUT VIC AB 2-0 CT1 TAPERPNT 27 (SUTURE) ×2 IMPLANT
SUTURE FIBERWR #2 38 BLUE 1/2 (SUTURE) ×4 IMPLANT
SYR 10ML LL (SYRINGE) ×1 IMPLANT
SYR 30ML LL (SYRINGE) ×1 IMPLANT
SYR TOOMEY 50ML (SYRINGE) ×1 IMPLANT
TIP FAN IRRIG PULSAVAC PLUS (DISPOSABLE) ×1 IMPLANT
TRAP FLUID SMOKE EVACUATOR (MISCELLANEOUS) ×1 IMPLANT
WATER STERILE IRR 500ML POUR (IV SOLUTION) ×1 IMPLANT
WRAPON POLAR PAD SHDR UNIV (MISCELLANEOUS) ×1

## 2023-04-13 NOTE — Anesthesia Procedure Notes (Signed)
Procedure Name: Intubation Date/Time: 04/13/2023 10:49 AM  Performed by: Nelle Don, CRNAPre-anesthesia Checklist: Patient identified, Emergency Drugs available, Suction available and Patient being monitored Patient Re-evaluated:Patient Re-evaluated prior to induction Oxygen Delivery Method: Circle system utilized Preoxygenation: Pre-oxygenation with 100% oxygen Induction Type: IV induction Ventilation: Mask ventilation without difficulty Laryngoscope Size: McGraph and 4 Grade View: Grade I Tube type: Oral Tube size: 7.0 mm Number of attempts: 1 Airway Equipment and Method: Video-laryngoscopy and Stylet Placement Confirmation: ETT inserted through vocal cords under direct vision, positive ETCO2 and breath sounds checked- equal and bilateral Secured at: 22 cm Tube secured with: Tape Dental Injury: Teeth and Oropharynx as per pre-operative assessment  Comments: Elective mcgraph

## 2023-04-13 NOTE — Discharge Instructions (Addendum)
Orthopedic discharge instructions: May shower with intact OpSite dressing once nerve block has worn off (around Monday).  Apply ice frequently to shoulder or use Polar Care device. Take Aleve 2 tablets BID with meals for 3-5 days, then as necessary. Take oxycodone as prescribed when needed.  May supplement with ES Tylenol if necessary. May resume Plavix tomorrow as prescribed. Keep shoulder immobilizer on at all times except may remove for bathing purposes. Follow-up in 10-14 days or as scheduled.   POLAR CARE INFORMATION [email or web address]  How to use Breg Polar Care [facility] Therapy System?  YouTube   ShippingScam.co.uk  OPERATING INSTRUCTIONS  Start the product With dry hands, connect the transformer to the electrical connection located on the top of the cooler. Next, plug the transformer into an appropriate electrical outlet. The unit will automatically start running at this point.  To stop the pump, disconnect electrical power.  Unplug to stop the product when not in use. Unplugging the Polar Care unit turns it off. Always unplug immediately after use. Never leave it plugged in while unattended. Remove pad.    FIRST ADD WATER TO FILL LINE, THEN ICE---Replace ice when existing ice is almost melted  1 Discuss Treatment with your Licensed Health Care Practitioner and Use Only as Prescribed 2 Apply Insulation Barrier & Cold Therapy Pad 3 Check for Moisture 4 Inspect Skin Regularly  Tips and Trouble Shooting Usage Tips 1. Use cubed or chunked ice for optimal performance. 2. It is recommended to drain the Pad between uses. To drain the pad, hold the Pad upright with the hose pointed toward the ground. Depress the black plunger and allow water to drain out. 3. You may disconnect the Pad from the unit without removing the pad from the affected area by depressing the silver tabs on the hose coupling and gently pulling the hoses apart. The Pad and unit will seal  itself and will not leak. Note: Some dripping during release is normal. 4. DO NOT RUN PUMP WITHOUT WATER! The pump in this unit is designed to run with water. Running the unit without water will cause permanent damage to the pump. 5. Unplug unit before removing lid.  TROUBLESHOOTING GUIDE Pump not running, Water not flowing to the pad, Pad is not getting cold 1. Make sure the transformer is plugged into the wall outlet. 2. Confirm that the ice and water are filled to the indicated levels. 3. Make sure there are no kinks in the pad. 4. Gently pull on the blue tube to make sure the tube/pad junction is straight. 5. Remove the pad from the treatment site and ll it while the pad is lying at; then reapply. 6. Confirm that the pad couplings are securely attached to the unit. Listen for the double clicks (Figure 1) to confirm the pad couplings are securely attached.  Leaks    Note: Some condensation on the lines, controller, and pads is unavoidable, especially in warmer climates. 1. If using a Breg Polar Care Cold Therapy unit with a detachable Cold Therapy Pad, and a leak exists (other than condensation on the lines) disconnect the pad couplings. Make sure the silver tabs on the couplings are depressed before reconnecting the pad to the pump hose; then confirm both sides of the coupling are properly clicked in. 2. If the coupling continues to leak or a leak is detected in the pad itself, stop using it and call B[employer] Customer Care at [phone].  Cleaning After use, empty and dry the unit with  a soft cloth. Warm water and mild detergent may be used occasionally to clean the pump and tubes.  WARNING: The Polar Care Cube can be cold enough to cause serious injury, including full skin necrosis. Follow these Operating Instructions, and carefully read the Product Insert (see pouch on side of unit) and the Cold Therapy Pad Fitting Instructions (provided with each Cold Therapy Pad) prior to  use.  Interscalene Nerve Block with Exparel  For your surgery you have received an Interscalene Nerve Block with Exparel. Nerve Blocks affect many types of nerves, including nerves that control movement, pain and normal sensation.  You may experience feelings such as numbness, tingling, heaviness, weakness or the inability to move your arm or the feeling or sensation that your arm has "fallen asleep". A nerve block with Exparel can last up to 5 days.  Usually the weakness wears off first.  The tingling and heaviness usually wear off next.  Finally you may start to notice pain.  Keep in mind that this may occur in any order.  Once a nerve block starts to wear off it is usually completely gone within 60 minutes. ISNB may cause mild shortness of breath, a hoarse voice, blurry vision, unequal pupils, or drooping of the face on the same side as the nerve block.  These symptoms will usually resolve with the numbness.  Very rarely the procedure itself can cause mild seizures. If needed, your surgeon will give you a prescription for pain medication.  It will take about 60 minutes for the oral pain medication to become fully effective.  So, it is recommended that you start taking this medication before the nerve block first begins to wear off, or when you first begin to feel discomfort. Take your pain medication only as prescribed.  Pain medication can cause sedation and decrease your breathing if you take more than you need for the level of pain that you have. Nausea is a common side effect of many pain medications.  You may want to eat something before taking your pain medicine to prevent nausea. After an Interscalene nerve block, you cannot feel pain, pressure or extremes in temperature in the effected arm.  Because your arm is numb it is at an increased risk for injury.  To decrease the possibility of injury, please practice the following:  While you are awake change the position of your arm frequently to  prevent too much pressure on any one area for prolonged periods of time.  If you have a cast or tight dressing, check the color or your fingers every couple of hours.  Call your surgeon with the appearance of any discoloration (white or blue). If you are given a sling to wear before you go home, please wear it  at all times until the block has completely worn off.  Do not get up at night without your sling. Please contact [facility] or your surgeon if you do not begin to regain sensation after 7 days from the surgery.  Anesthesia may be contacted by calling the Same Day Surgery Department, Mon. through Fri., 6 am to 4 pm at [phone].   If you experience any other problems or concerns, please contact your surgeon's office. If you experience severe or prolonged shortness of breath go to the nearest emergency department.Information for Discharge Teaching: EXPAREL (bupivacaine liposome injectable suspension)   Pain relief is important to your recovery. The goal is to control your pain so you can move easier and return to your normal  activities as soon as possible after your procedure. Your physician may use several types of medicines to manage pain, swelling, and more.  Your surgeon or anesthesiologist gave you EXPAREL(bupivacaine) to help control your pain after surgery.  EXPAREL is a local anesthetic designed to release slowly over an extended period of time to provide pain relief by numbing the tissue around the surgical site. EXPAREL is designed to release pain medication over time and can control pain for up to 72 hours. Depending on how you respond to EXPAREL, you may require less pain medication during your recovery. EXPAREL can help reduce or eliminate the need for opioids during the first few days after surgery when pain relief is needed the most. EXPAREL is not an opioid and is not addictive. It does not cause sleepiness or sedation.   Important! A teal colored band has been placed  on your arm with the date, time and amount of EXPAREL you have received. Please leave this armband in place for the full 96 hours following administration, and then you may remove the band. If you return to the hospital for any reason within 96 hours following the administration of EXPAREL, the armband provides important information that your health care providers to know, and alerts them that you have received this anesthetic.   Possible side effects of EXPAREL: Temporary loss of sensation or ability to move in the area where medication was injected. Nausea, vomiting, constipation Rarely, numbness and tingling in your mouth or lips, lightheadedness, or anxiety may occur. Call your doctor right away if you think you may be experiencing any of these sensations, or if you have other questions regarding possible side effects. Follow all other discharge instructions given to you by your surgeon or nurse. Eat a healthy diet and drink plenty of water or other fluids.  SHOULDER SLING IMMOBILIZER   VIDEO Slingshot 2 Shoulder Brace Application - YouTube ---https://www.porter.info/  INSTRUCTIONS While supporting the injured arm, slide the forearm into the sling. Wrap the adjustable shoulder strap around the neck and shoulders and attach the strap end to the sling using  the "alligator strap tab."  Adjust the shoulder strap to the required length. Position the shoulder pad behind the neck. To secure the shoulder pad location (optional), pull the shoulder strap away from the shoulder pad, unfold the hook material on the top of the pad, then press the shoulder strap back onto the hook material to secure the pad in place. Attach the closure strap across the open top of the sling. Position the strap so that it holds the arm securely in the sling. Next, attach the thumb strap to the open end of the sling between the thumb and fingers. After sling has been fit, it may be easily removed and  reapplied using the quick release buckle on shoulder strap. If a neutral pillow or 15 abduction pillow is included, place the pillow at the waistline. Attach the sling to the pillow, lining up hook material on the pillow with the loop on sling. Adjust the waist strap to fit.  If waist strap is too long, cut it to fit. Use the small piece of double sided hook material (located on top of the pillow) to secure the strap end. Place the double sided hook material on the inside of the cut strap end and secure it to the waist strap.     If no pillow is included, attach the waist strap to the sling and adjust to fit.    Washing  Instructions: Straps and sling must be removed and cleaned regularly depending on your activity level and perspiration. Hand wash straps and sling in cold water with mild detergent, rinse, air dry

## 2023-04-13 NOTE — Anesthesia Preprocedure Evaluation (Addendum)
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), No Residual Symptoms  negative psych ROS   GI/Hepatic Neg liver ROS,GERD  ,,  Endo/Other  Hypothyroidism    Renal/GU Renal InsufficiencyRenal disease     Musculoskeletal   Abdominal Normal abdominal exam  (+)   Peds  Hematology  (+) Blood dyscrasia, anemia Myelofibrosis   Anesthesia Other Findings Vertigo     Past Medical History: No date: Arthritis No date: Benign hypertensive renal disease No date: Biceps tendon rupture, right, initial encounter No date: COVID-19 No date: GERD (gastroesophageal reflux disease) No date: Heartburn No date: History of kidney stones No date: History of retinal detachment No date: Hyperlipidemia No date: Hypertension No date: Hypothyroidism No date: Infraspinatus tendon tear, right, initial encounter No date: Melanoma (HCC)     Comment:  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 approximately               10-15 years ago No date: Myelofibrosis (HCC) No date: Prostate hypertrophy 01/11/2023: 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, EDC 11/2007: Stroke (HCC)     Comment:  R brain subcortical infarct  Past Surgical History: 07/08/2015: ASPIRATION / INJECTION RENAL CYST No date: BACK SURGERY     Comment:  approx 20- 25 years ago No date: COLONOSCOPY No date: EYE SURGERY     Comment:  cataract both eyes 08/11/2011: GAS  INSERTION     Comment:  Procedure: INSERTION OF GAS;  Surgeon: Sherrie George,               MD;  Location: [facility] OR;  Service: Ophthalmology;                Laterality: Right;  C3F8 08/11/2011: SCLERAL BUCKLE     Comment:  Procedure: SCLERAL BUCKLE;  Surgeon: Sherrie George,               MD;  Location: [facility] OR;  Service: Ophthalmology;                Laterality: Right; No date: VARICOSE VEIN SURGERY     Reproductive/Obstetrics negative OB ROS                             Anesthesia Physical Anesthesia Plan  ASA: 3  Anesthesia Plan: General ETT   Post-op Pain Management: Regional block*   Induction: Intravenous  PONV Risk Score and Plan: 2 and Ondansetron, Dexamethasone and Midazolam  Airway Management Planned: Oral ETT  Additional Equipment:   Intra-op Plan:   Post-operative Plan: Extubation in OR  Informed Consent: I have reviewed the patients History and Physical, chart, labs and discussed the procedure including the risks, benefits and alternatives for the proposed anesthesia with the patient or authorized representative who has indicated his/her understanding and acceptance.     Dental Advisory Given  Plan Discussed with: CRNA and Surgeon  Anesthesia Plan  Comments:         Anesthesia Quick Evaluation

## 2023-04-13 NOTE — Evaluation (Signed)
Occupational Therapy Evaluation Patient Details Name: [name] MRN: [id number] DOB: 04-14-1941 Today's Date: 04/13/2023   History of Present Illness 82yo male s/p R reverse TSA on 04/13/23.   Clinical Impression   Patient was seen for an OT evaluation this date. Pt lives with his spouse in a multi story home but with a Armed forces logistics/support/administrative officer on the main floor. Pt active and independent, driving, and working at baseline per pt/family report. Pt uses a wide based QC for most mobility. Endorses 5-6 falls in the past 40mo 2/2 a herniated disc that occasionally sends sharp pain down his R leg. Pt has orders for RUE to be immobilized and will be NWBing per MD. Patient presents with impaired strength/ROM, pain, and sensation to RUE. These impairments result in a decreased ability to perform self care tasks requiring MOD-MAX assist for UB/LB dressing and bathing and MAX assist for application of polar care and sling/immobilizer. Pt/family educated in polar care mgt, shoulder sling/immobilizer mgt, falls prevention, hemi techniques for UB bathing, grooming, and dressing, home/routines modifications, RUE precautions and ROM exercises, and positioning and considerations for sleep. Handout provided. Pt completed ADL transfers and mobility with WB QC with CGA. OT adjusted sling/immobilizer and polar care to improve comfort, optimize positioning, and to maximize skin integrity/safety. Pt/family verbalized understanding of all education/training provided. Pt will benefit from additional skilled therapy follow up as per surgeon for rehab of shoulder.       If plan is discharge home, recommend the following: A little help with walking and/or transfers;A lot of help with bathing/dressing/bathroom;Assistance with cooking/housework;Assist for transportation;Help with stairs or ramp for entrance;Direct supervision/assist for medications management    Functional Status Assessment  Patient has had a recent decline in their  functional status and demonstrates the ability to make significant improvements in function in a reasonable and predictable amount of time.  Equipment Recommendations  None recommended by OT    Recommendations for Other Services       Precautions / Restrictions Precautions Precautions: Fall;Shoulder Shoulder Interventions: Shoulder sling/immobilizer;Shoulder abduction pillow;At all times;Off for dressing/bathing/exercises Precaution Booklet Issued: Yes (comment) Restrictions Weight Bearing Restrictions: Yes RUE Weight Bearing: Non weight bearing Other Position/Activity Restrictions: elbow/wrist/hand ROM ok      Mobility Bed Mobility               General bed mobility comments: NT    Transfers Overall transfer level: Needs assistance Equipment used: Quad cane Transfers: Sit to/from Stand Sit to Stand: Contact guard assist           General transfer comment: VC for anterior shift prior to standing, good hand placement to push himself up      Balance Overall balance assessment: Needs assistance Sitting-balance support: No upper extremity supported, Feet supported Sitting balance-Leahy Scale: Normal     Standing balance support: Single extremity supported, Reliant on assistive device for balance Standing balance-Leahy Scale: Fair                             ADL either performed or assessed with clinical judgement   ADL Overall ADL's : Needs assistance/impaired                                       General ADL Comments: Pt currently requires MAX A for UB ADL including polar care mgt and shoulder sling/immobilizer mgt, MOD  A for LB ADL tasks, and SBA-CGA for ADL trasnfers with WB QC. Frequent VC to relax his shoulders.     Vision         Perception         Praxis         Pertinent Vitals/Pain Pain Assessment Pain Assessment: No/denies pain     Extremity/Trunk Assessment Upper Extremity Assessment Upper Extremity  Assessment: Right hand dominant;RUE deficits/detail RUE Deficits / Details: s/p rev TSA, beginning to get sensation back in hand RUE: Unable to fully assess due to immobilization RUE Sensation: decreased light touch;decreased proprioception RUE Coordination: decreased fine motor;decreased gross motor   Lower Extremity Assessment Lower Extremity Assessment: Generalized weakness       Communication Communication Communication: Hearing impairment   Cognition Arousal: Alert Behavior During Therapy: WFL for tasks assessed/performed Overall Cognitive Status: Difficult to assess                                       General Comments       Exercises Other Exercises Other Exercises: Pt/family educated in polar care mgt, shoulder sling/immobilizer mgt, falls prevention, hemi techniques for UB bathing, grooming, and dressing, home/routines modifications, RUE precautions and ROM exercises. HAndout provided.   Shoulder Instructions      Home Living Family/patient expects to be discharged to:: Private residence Living Arrangements: Spouse/significant other Available Help at Discharge: Family;Available 24 hours/day;Available PRN/intermittently (son in town assisting too) Type of Home: House Home Access: Stairs to enter [employer] of Steps: 4 Entrance Stairs-Rails: Right;Left Home Layout: Multi-level;Able to live on main level with bedroom/bathroom     Bathroom Shower/Tub: Producer, television/film/video: Handicapped height     Home Equipment: Cane - quad   Additional Comments: *wide base QC      Prior Functioning/Environment Prior Level of Function : Independent/Modified Independent;Working/employed;Driving;History of Falls (last six months)             Mobility Comments: WB QC but reports he can walk without it, working, driving, active per pt report ADLs Comments: Indep with ADL, limited by R shoulder in lifting, overhead, endorses 5-6 falls  in past 51mo 2/2 herniated disc and pain shooting down his RLE        OT Problem List: Impaired UE functional use;Decreased knowledge of precautions;Impaired balance (sitting and/or standing);Decreased knowledge of use of DME or AE;Decreased range of motion;Impaired sensation;Decreased strength;Decreased coordination      OT Treatment/Interventions:      OT Goals(Current goals can be found in the care plan section) Acute Rehab OT Goals Patient Stated Goal: get better OT Goal Formulation: All assessment and education complete, DC therapy  OT Frequency:      Co-evaluation              AM-PAC OT "6 Clicks" Daily Activity     Outcome Measure Help from another person eating meals?: A Little Help from another person taking care of personal grooming?: A Little Help from another person toileting, which includes using toliet, bedpan, or urinal?: A Little Help from another person bathing (including washing, rinsing, drying)?: A Lot Help from another person to put on and taking off regular upper body clothing?: A Lot Help from another person to put on and taking off regular lower body clothing?: A Lot 6 Click Score: 15   End of Session Equipment Utilized During Treatment: Other (comment) (shoulder sling/immobilizer,  QC) Nurse Communication: Mobility status  Activity Tolerance: Patient tolerated treatment well Patient left: in chair;with call bell/phone within reach;with family/visitor present  OT Visit Diagnosis: Other abnormalities of gait and mobility (R26.89)                Time: [id number] OT Time Calculation (min): 35 min Charges:  OT General Charges $OT Visit: 1 Visit OT Evaluation $OT Eval Moderate Complexity: 1 Mod OT Treatments $Self Care/Home Management : 23-37 mins  Arman Filter., MPH, MS, OTR/L ascom [phone] 04/13/23, 5:05 PM

## 2023-04-13 NOTE — Transfer of Care (Signed)
Immediate Anesthesia Transfer of Care Note  Patient: [name]  Procedure(s) Performed: REVERSE SHOULDER ARTHROPLASTY (Right: Shoulder)  Patient Location: PACU  Anesthesia Type:General  Level of Consciousness: drowsy  Airway & Oxygen Therapy: Patient Spontanous Breathing and Patient connected to face mask oxygen  Post-op Assessment: Report given to RN and Post -op Vital signs reviewed and stable  Post vital signs: Reviewed and stable  Last Vitals:  Vitals Value Taken Time  BP 128/55 04/13/23 1313  Temp    Pulse 68 04/13/23 1314  Resp 14 04/13/23 1314  SpO2 99 % 04/13/23 1314  Vitals shown include unfiled device data.  Last Pain:  Vitals:   04/13/23 0918  TempSrc: Temporal  PainSc: 6       Patients Stated Pain Goal: 2 (04/13/23 3244)  Complications: No notable events documented.

## 2023-04-13 NOTE — Op Note (Signed)
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  Post-Op Diagnosis:   Same  Procedure:   Reverse right total shoulder arthroplasty.  Surgeon:   Maryagnes Amos, MD  Assistant:   Horris Latino, PA-C  Anesthesia:   General endotracheal with an interscalene block using Exparel placed preoperatively by the anesthesiologist.  Findings:   As above.  Complications:   None  EBL:   125 cc  Fluids:   900 cc crystalloid  UOP:   None  TT:   None  Drains:   None  Closure:   Staples  Implants:   All press-fit Arthrex system with a #9 Univers Revers humeral stem, a 36/39 mm SutureCup with a +6 insert, and a 24 mm base plate with a 39 mm  +4 lateralized glenosphere.  Brief Clinical Note:   The patient is an 82 year old male with a history of progressively worsening right shoulder pain and weakness. His symptoms have progressed despite medications, activity modification, etc. His history and examination are consistent with advanced rotator cuff arthropathy secondary to an irreparable rotator cuff tear, all of which were confirmed by MRI scan. The patient presents at this time for a reverse right total shoulder arthroplasty.  Procedure:   The patient underwent placement of an interscalene block using Exparel by the anesthesiologist in the preoperative holding area before being brought into the operating room and lain in the supine position. The patient then underwent general endotracheal intubation and anesthesia before the patient was repositioned in the beach chair position using the beach chair positioner. The right shoulder and upper extremity were prepped with ChloraPrep solution before being draped sterilely. Preoperative antibiotics were administered. A timeout was performed to verify the appropriate surgical site.    A standard anterior approach to the shoulder was  made through an approximately 4-5 inch incision. The incision was carried down through the subcutaneous tissues to expose the deltopectoral fascia. The interval between the deltoid and pectoralis muscles was identified and this plane developed, retracting the cephalic vein laterally with the deltoid muscle. The conjoined tendon was identified. Its lateral margin was dissected and the Kolbel self-retraining retractor inserted. The "three sisters" were identified and cauterized. Bursal tissues were removed to improve visualization.   The biceps tendon was identified near the inferior aspect of the bicipital groove. A soft tissue tenodesis was performed by attaching the biceps tendon to the adjacent pectoralis major tendon using two #0 Ethibond interrupted sutures. The biceps tendon was then transected just proximal to the tenodesis site. The subscapularis tendon was released from its attachment to the lesser tuberosity 1 cm proximal to its insertion and several tagging sutures placed. The inferior capsule was released with care after identifying and protecting the axillary nerve. The proximal humeral cut was made at approximately 30 of retroversion using the extra-medullary guide.   Attention was redirected to the glenoid. The labrum was debrided circumferentially before the center of the glenoid was marked with electrocautery. The guidewire was drilled into the glenoid neck using the appropriate guide. After verifying its position, it was overreamed with the baseplate reamer to create a flat surface before the peripheral reamer was introduced to clean off any peripheral soft tissues. The central 10 mm coring reamer was then inserted before the hole was tapped to complete the glenoid preparation. The permanent mini-baseplate construct with a 30 mm screw attachment was screwed into  place and tightened securely. The baseplate was then further secured using four peripheral locking screws. The permanent 39 mm +4 mm  lateralized glenosphere was then impacted into place and its Morse taper locking mechanism verified using manual distraction. Finally, the glenosphere locking screw was inserted and tightened securely.  Attention was directed to the humeral side. The humeral canal was reamed with first the 5 mm and then the 6 mm reamer. The canal was broached beginning with a #5 broach and progressing to a #9 broach. This was left in place and the metaphyseal inset reamer was used to create the metaphyseal socket.  A trial reduction performed using the 36 mm trial humeral platform with first the +3 mm insert and then the +6 mm insert.  With the +6 mm insert, the arm demonstrated excellent range of motion as the hand could be brought across the chest to the opposite shoulder and brought to the top of the patient's head and to the patient's ear. The shoulder appeared stable throughout this range of motion.   The joint was dislocated and the trial components removed. The permanent #9 Univers Revers stem was impacted into place with care taken to maintain the appropriate version. The permanent 36/39 mm SutureCup humeral platform was then impacted into place before the +6 mm insert was snapped into place. The shoulder was relocated using two finger pressure and again placed through a range of motion with the findings as described above.  The wound was copiously irrigated with sterile saline solution using the jet lavage system before a total of 30 cc of 0.5% Sensorcaine with epinephrine was injected into the pericapsular and peri-incisional tissues to help with postoperative analgesia. The subscapularis tendon was reapproximated using #2 FiberWire interrupted sutures. The deltopectoral interval was closed using #0 Vicryl interrupted sutures before the subcutaneous tissues were closed using 2-0 Vicryl interrupted sutures. The skin was closed using staples. Prior to closing the skin, 1 g of transexemic acid in 10 cc of normal saline  was injected intra-articularly to help with postoperative bleeding. A sterile occlusive dressing was applied to the wound before the arm was placed into a shoulder immobilizer with an abduction pillow. A Polar Care system also was applied to the shoulder. The patient was then transferred back to a hospital bed before being awakened, extubated, and returned to the recovery room in satisfactory condition after tolerating the procedure well.
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
## 2023-04-13 NOTE — Anesthesia Procedure Notes (Signed)
Anesthesia Regional Block: Interscalene brachial plexus block   Pre-Anesthetic Checklist: , timeout performed,  Correct Patient, Correct Site, Correct Laterality,  Correct Procedure, Correct Position, site marked,  Risks and benefits discussed,  Surgical consent,  Pre-op evaluation,  At surgeon's request and post-op pain management  Laterality: Right  Prep: chloraprep       Needles:  Injection technique: Single-shot  Needle Type: Stimiplex     Needle Length: 9cm  Needle Gauge: 22     Additional Needles:   Procedures:,,,, ultrasound used (permanent image in chart),,    Narrative:  Start time: 04/13/2023 10:09 AM End time: 04/13/2023 10:11 AM Injection made incrementally with aspirations every 5 mL.  Performed by: Personally  Anesthesiologist: Foye Deer, MD  Additional Notes: Patient consented for risk and benefits of nerve block including but not limited to nerve damage, failed block, bleeding and infection.  Patient voiced understanding.  Functioning IV was confirmed and monitors were applied.  Timeout done prior to procedure and prior to any sedation being given to the patient.  Patient confirmed procedure site prior to any sedation given to the patient. Sterile prep,hand hygiene and sterile gloves were used.  Minimal sedation used for procedure.  No paresthesia endorsed by patient during the procedure.  Negative aspiration and negative test dose prior to incremental administration of local anesthetic. The patient tolerated the procedure well with no immediate complications.

## 2023-04-14 NOTE — Anesthesia Postprocedure Evaluation (Signed)
Anesthesia Post Note  Patient: [name]  Procedure(s) Performed: REVERSE SHOULDER ARTHROPLASTY (Right: Shoulder)  Patient location during evaluation: PACU Anesthesia Type: General Level of consciousness: awake and alert Pain management: pain level controlled Vital Signs Assessment: post-procedure vital signs reviewed and stable Respiratory status: spontaneous breathing, nonlabored ventilation and respiratory function stable Cardiovascular status: blood pressure returned to baseline and stable Postop Assessment: no apparent nausea or vomiting Anesthetic complications: no   No notable events documented.   Last Vitals:  Vitals:   04/13/23 1449 04/13/23 1701  BP:  126/65  Pulse: 71 69  Resp: 15 16  Temp: 36.7 C   SpO2: 98% 92%    Last Pain:  Vitals:   04/13/23 1701  TempSrc:   PainSc: 0-No pain                 Foye Deer

## 2023-04-15 ENCOUNTER — Encounter: Payer: Self-pay | Admitting: Surgery

## 2023-04-15 DIAGNOSIS — E119 Type 2 diabetes mellitus without complications: Secondary | ICD-10-CM | POA: Diagnosis not present

## 2023-04-15 DIAGNOSIS — I10 Essential (primary) hypertension: Secondary | ICD-10-CM | POA: Diagnosis not present

## 2023-04-15 DIAGNOSIS — Z471 Aftercare following joint replacement surgery: Secondary | ICD-10-CM | POA: Diagnosis not present

## 2023-04-15 DIAGNOSIS — Z7984 Long term (current) use of oral hypoglycemic drugs: Secondary | ICD-10-CM | POA: Diagnosis not present

## 2023-04-15 DIAGNOSIS — Z8719 Personal history of other diseases of the digestive system: Secondary | ICD-10-CM | POA: Diagnosis not present

## 2023-04-15 DIAGNOSIS — M12811 Other specific arthropathies, not elsewhere classified, right shoulder: Secondary | ICD-10-CM | POA: Diagnosis not present

## 2023-04-15 DIAGNOSIS — Z96611 Presence of right artificial shoulder joint: Secondary | ICD-10-CM | POA: Diagnosis not present

## 2023-04-15 DIAGNOSIS — E079 Disorder of thyroid, unspecified: Secondary | ICD-10-CM | POA: Diagnosis not present

## 2023-04-15 DIAGNOSIS — M51369 Other intervertebral disc degeneration, lumbar region without mention of lumbar back pain or lower extremity pain: Secondary | ICD-10-CM | POA: Diagnosis not present

## 2023-04-15 DIAGNOSIS — D471 Chronic myeloproliferative disease: Secondary | ICD-10-CM | POA: Diagnosis not present

## 2023-04-15 DIAGNOSIS — D649 Anemia, unspecified: Secondary | ICD-10-CM | POA: Diagnosis not present

## 2023-04-15 DIAGNOSIS — Z87448 Personal history of other diseases of urinary system: Secondary | ICD-10-CM | POA: Diagnosis not present

## 2023-04-15 DIAGNOSIS — Z87891 Personal history of nicotine dependence: Secondary | ICD-10-CM | POA: Diagnosis not present

## 2023-04-15 DIAGNOSIS — M75121 Complete rotator cuff tear or rupture of right shoulder, not specified as traumatic: Secondary | ICD-10-CM | POA: Diagnosis not present

## 2023-04-15 DIAGNOSIS — Z8673 Personal history of transient ischemic attack (TIA), and cerebral infarction without residual deficits: Secondary | ICD-10-CM | POA: Diagnosis not present

## 2023-04-15 DIAGNOSIS — Z7902 Long term (current) use of antithrombotics/antiplatelets: Secondary | ICD-10-CM | POA: Diagnosis not present

## 2023-04-17 ENCOUNTER — Encounter: Payer: Self-pay | Admitting: Emergency Medicine

## 2023-04-17 ENCOUNTER — Emergency Department: Payer: Medicare Other

## 2023-04-17 ENCOUNTER — Other Ambulatory Visit: Payer: Self-pay

## 2023-04-17 ENCOUNTER — Inpatient Hospital Stay
Admission: EM | Admit: 2023-04-17 | Discharge: 2023-04-21 | DRG: 683 | Disposition: A | Discharge status: Home Health | Payer: Medicare Other | Attending: Internal Medicine | Admitting: Internal Medicine

## 2023-04-17 DIAGNOSIS — E860 Dehydration: Secondary | ICD-10-CM | POA: Diagnosis present

## 2023-04-17 DIAGNOSIS — D649 Anemia, unspecified: Secondary | ICD-10-CM | POA: Diagnosis present

## 2023-04-17 DIAGNOSIS — M11262 Other chondrocalcinosis, left knee: Secondary | ICD-10-CM | POA: Diagnosis present

## 2023-04-17 DIAGNOSIS — Z888 Allergy status to other drugs, medicaments and biological substances status: Secondary | ICD-10-CM | POA: Diagnosis not present

## 2023-04-17 DIAGNOSIS — N400 Enlarged prostate without lower urinary tract symptoms: Secondary | ICD-10-CM | POA: Diagnosis not present

## 2023-04-17 DIAGNOSIS — R4182 Altered mental status, unspecified: Secondary | ICD-10-CM | POA: Diagnosis not present

## 2023-04-17 DIAGNOSIS — M7989 Other specified soft tissue disorders: Secondary | ICD-10-CM | POA: Diagnosis not present

## 2023-04-17 DIAGNOSIS — F419 Anxiety disorder, unspecified: Secondary | ICD-10-CM | POA: Diagnosis present

## 2023-04-17 DIAGNOSIS — D631 Anemia in chronic kidney disease: Secondary | ICD-10-CM | POA: Diagnosis not present

## 2023-04-17 DIAGNOSIS — M25562 Pain in left knee: Secondary | ICD-10-CM

## 2023-04-17 DIAGNOSIS — K219 Gastro-esophageal reflux disease without esophagitis: Secondary | ICD-10-CM | POA: Diagnosis not present

## 2023-04-17 DIAGNOSIS — N179 Acute kidney failure, unspecified: Secondary | ICD-10-CM | POA: Diagnosis not present

## 2023-04-17 DIAGNOSIS — M1712 Unilateral primary osteoarthritis, left knee: Secondary | ICD-10-CM | POA: Diagnosis present

## 2023-04-17 DIAGNOSIS — I440 Atrioventricular block, first degree: Secondary | ICD-10-CM | POA: Diagnosis present

## 2023-04-17 DIAGNOSIS — E785 Hyperlipidemia, unspecified: Secondary | ICD-10-CM | POA: Diagnosis present

## 2023-04-17 DIAGNOSIS — D471 Chronic myeloproliferative disease: Secondary | ICD-10-CM | POA: Diagnosis present

## 2023-04-17 DIAGNOSIS — Z8616 Personal history of COVID-19: Secondary | ICD-10-CM

## 2023-04-17 DIAGNOSIS — Z8249 Family history of ischemic heart disease and other diseases of the circulatory system: Secondary | ICD-10-CM | POA: Diagnosis not present

## 2023-04-17 DIAGNOSIS — E875 Hyperkalemia: Secondary | ICD-10-CM | POA: Diagnosis not present

## 2023-04-17 DIAGNOSIS — Z85828 Personal history of other malignant neoplasm of skin: Secondary | ICD-10-CM | POA: Diagnosis not present

## 2023-04-17 DIAGNOSIS — Z79899 Other long term (current) drug therapy: Secondary | ICD-10-CM

## 2023-04-17 DIAGNOSIS — N1832 Chronic kidney disease, stage 3b: Secondary | ICD-10-CM | POA: Diagnosis not present

## 2023-04-17 DIAGNOSIS — N401 Enlarged prostate with lower urinary tract symptoms: Secondary | ICD-10-CM | POA: Diagnosis not present

## 2023-04-17 DIAGNOSIS — Z87891 Personal history of nicotine dependence: Secondary | ICD-10-CM | POA: Diagnosis not present

## 2023-04-17 DIAGNOSIS — M25462 Effusion, left knee: Secondary | ICD-10-CM | POA: Diagnosis not present

## 2023-04-17 DIAGNOSIS — M79641 Pain in right hand: Secondary | ICD-10-CM | POA: Diagnosis not present

## 2023-04-17 DIAGNOSIS — R351 Nocturia: Secondary | ICD-10-CM | POA: Diagnosis not present

## 2023-04-17 DIAGNOSIS — E872 Acidosis, unspecified: Secondary | ICD-10-CM | POA: Diagnosis present

## 2023-04-17 DIAGNOSIS — M542 Cervicalgia: Secondary | ICD-10-CM | POA: Diagnosis present

## 2023-04-17 DIAGNOSIS — R410 Disorientation, unspecified: Secondary | ICD-10-CM | POA: Diagnosis not present

## 2023-04-17 DIAGNOSIS — E039 Hypothyroidism, unspecified: Secondary | ICD-10-CM | POA: Diagnosis not present

## 2023-04-17 DIAGNOSIS — I959 Hypotension, unspecified: Secondary | ICD-10-CM | POA: Diagnosis not present

## 2023-04-17 DIAGNOSIS — R443 Hallucinations, unspecified: Secondary | ICD-10-CM

## 2023-04-17 DIAGNOSIS — R509 Fever, unspecified: Secondary | ICD-10-CM | POA: Diagnosis not present

## 2023-04-17 DIAGNOSIS — N184 Chronic kidney disease, stage 4 (severe): Secondary | ICD-10-CM

## 2023-04-17 DIAGNOSIS — I6523 Occlusion and stenosis of bilateral carotid arteries: Secondary | ICD-10-CM | POA: Diagnosis not present

## 2023-04-17 DIAGNOSIS — G8929 Other chronic pain: Secondary | ICD-10-CM | POA: Diagnosis present

## 2023-04-17 DIAGNOSIS — Z96611 Presence of right artificial shoulder joint: Secondary | ICD-10-CM | POA: Diagnosis not present

## 2023-04-17 DIAGNOSIS — I6990 Unspecified sequelae of unspecified cerebrovascular disease: Secondary | ICD-10-CM

## 2023-04-17 DIAGNOSIS — Z85820 Personal history of malignant melanoma of skin: Secondary | ICD-10-CM

## 2023-04-17 DIAGNOSIS — R440 Auditory hallucinations: Secondary | ICD-10-CM | POA: Diagnosis present

## 2023-04-17 DIAGNOSIS — Z79890 Hormone replacement therapy (postmenopausal): Secondary | ICD-10-CM

## 2023-04-17 DIAGNOSIS — R441 Visual hallucinations: Secondary | ICD-10-CM | POA: Diagnosis present

## 2023-04-17 DIAGNOSIS — I129 Hypertensive chronic kidney disease with stage 1 through stage 4 chronic kidney disease, or unspecified chronic kidney disease: Secondary | ICD-10-CM | POA: Diagnosis present

## 2023-04-17 DIAGNOSIS — Z87442 Personal history of urinary calculi: Secondary | ICD-10-CM

## 2023-04-17 DIAGNOSIS — Z7902 Long term (current) use of antithrombotics/antiplatelets: Secondary | ICD-10-CM

## 2023-04-17 DIAGNOSIS — Z833 Family history of diabetes mellitus: Secondary | ICD-10-CM

## 2023-04-17 DIAGNOSIS — R609 Edema, unspecified: Secondary | ICD-10-CM | POA: Diagnosis not present

## 2023-04-17 DIAGNOSIS — I10 Essential (primary) hypertension: Secondary | ICD-10-CM | POA: Diagnosis not present

## 2023-04-17 LAB — COMPREHENSIVE METABOLIC PANEL
ALT: 9 U/L (ref 0–44)
AST: 17 U/L (ref 15–41)
Albumin: 3 g/dL — ABNORMAL LOW (ref 3.5–5.0)
Alkaline Phosphatase: 89 U/L (ref 38–126)
Anion gap: 8 (ref 5–15)
BUN: 69 mg/dL — ABNORMAL HIGH (ref 8–23)
CO2: 20 mmol/L — ABNORMAL LOW (ref 22–32)
Calcium: 7.9 mg/dL — ABNORMAL LOW (ref 8.9–10.3)
Chloride: 108 mmol/L (ref 98–111)
Creatinine, Ser: 3.24 mg/dL — ABNORMAL HIGH (ref 0.61–1.24)
GFR, Estimated: 18 mL/min — ABNORMAL LOW (ref >60)
Glucose, Bld: 129 mg/dL — ABNORMAL HIGH (ref 70–99)
Potassium: 5.2 mmol/L — ABNORMAL HIGH (ref 3.5–5.1)
Sodium: 136 mmol/L (ref 135–145)
Total Bilirubin: 0.9 mg/dL (ref <1.2)
Total Protein: 6.1 g/dL — ABNORMAL LOW (ref 6.5–8.1)

## 2023-04-17 LAB — CBC WITH DIFFERENTIAL/PLATELET
Abs Immature Granulocytes: 0.46 10*3/uL — ABNORMAL HIGH (ref 0.00–0.07)
Basophils Absolute: 0 10*3/uL (ref 0.0–0.1)
Basophils Relative: 0 %
Eosinophils Absolute: 0 10*3/uL (ref 0.0–0.5)
Eosinophils Relative: 0 %
HCT: 24.5 % — ABNORMAL LOW (ref 39.0–52.0)
Hemoglobin: 7.4 g/dL — ABNORMAL LOW (ref 13.0–17.0)
Immature Granulocytes: 5 %
Lymphocytes Relative: 6 %
Lymphs Abs: 0.5 10*3/uL — ABNORMAL LOW (ref 0.7–4.0)
MCH: 29 pg (ref 26.0–34.0)
MCHC: 30.2 g/dL (ref 30.0–36.0)
MCV: 96.1 fL (ref 80.0–100.0)
Monocytes Absolute: 1.4 10*3/uL — ABNORMAL HIGH (ref 0.1–1.0)
Monocytes Relative: 16 %
Neutro Abs: 6.2 10*3/uL (ref 1.7–7.7)
Neutrophils Relative %: 73 %
Platelets: 240 10*3/uL (ref 150–400)
RBC: 2.55 MIL/uL — ABNORMAL LOW (ref 4.22–5.81)
RDW: 18.7 % — ABNORMAL HIGH (ref 11.5–15.5)
Smear Review: NORMAL
WBC: 8.6 10*3/uL (ref 4.0–10.5)
nRBC: 0.7 % — ABNORMAL HIGH (ref 0.0–0.2)

## 2023-04-17 LAB — URINALYSIS, ROUTINE W REFLEX MICROSCOPIC
Bilirubin Urine: NEGATIVE
Glucose, UA: 50 mg/dL — AB
Hgb urine dipstick: NEGATIVE
Ketones, ur: NEGATIVE mg/dL
Leukocytes,Ua: NEGATIVE
Nitrite: NEGATIVE
Protein, ur: >=300 mg/dL — AB
Specific Gravity, Urine: 1.018 (ref 1.005–1.030)
pH: 5 (ref 5.0–8.0)

## 2023-04-17 LAB — AMMONIA: Ammonia: 18 umol/L (ref 9–35)

## 2023-04-17 MED ORDER — ONDANSETRON HCL 4 MG/2ML IJ SOLN
4.0000 mg | Freq: Once | INTRAMUSCULAR | Status: AC
  Administered 2023-04-17: 4 mg via INTRAVENOUS
  Filled 2023-04-17: qty 2

## 2023-04-17 MED ORDER — SODIUM CHLORIDE 0.9 % IV BOLUS
1000.0000 mL | Freq: Once | INTRAVENOUS | Status: AC
  Administered 2023-04-17: 1000 mL via INTRAVENOUS

## 2023-04-17 MED ORDER — MORPHINE SULFATE (PF) 4 MG/ML IV SOLN
4.0000 mg | Freq: Once | INTRAVENOUS | Status: AC
Start: 2023-04-17 — End: 2023-04-17
  Administered 2023-04-17: 4 mg via INTRAVENOUS
  Filled 2023-04-17: qty 1

## 2023-04-17 MED ORDER — MORPHINE SULFATE (PF) 2 MG/ML IV SOLN: 2.0000 mg | Freq: Once | INTRAVENOUS | Status: DC

## 2023-04-17 MED ORDER — ACETAMINOPHEN 325 MG PO TABS
650.0000 mg | ORAL_TABLET | Freq: Once | ORAL | Status: AC
  Administered 2023-04-17: 650 mg via ORAL
  Filled 2023-04-17: qty 2

## 2023-04-17 MED ORDER — LIDOCAINE HCL (PF) 1 % IJ SOLN
10.0000 mL | Freq: Once | INTRAMUSCULAR | Status: AC
  Administered 2023-04-17: 10 mL
  Filled 2023-04-17: qty 10

## 2023-04-17 NOTE — Assessment & Plan Note (Signed)
Continue tamsulosin.

## 2023-04-17 NOTE — Assessment & Plan Note (Signed)
Hyperkalemia Creatinine 3.24, up from baseline of 1.91 a couple months prior with bicarb 20 and potassium 5.2 Hydrate with 1 L bolus Encourage oral rehydration Monitor renal function and avoid nephrotoxins

## 2023-04-17 NOTE — H&P (Incomplete)
History and Physical    Patient: [name] FAO:[id number] DOB: [birth year] DOA: 04/17/2023 DOS: the patient was seen and examined on 04/17/2023 PCP: [name], DO  Patient coming from: Home  Chief Complaint:  Chief Complaint  Patient presents with  . Knee Pain  . Altered Mental Status    HPI: [name] is a 82 y.o. male with medical history significant for Who presents to the ED primarily for left knee pain and swelling after he twisted his knee when attempting to transfer from a chair(felt up pop).  Family also has a concern for auditory and tactile hallucinations that started 3 days prior in the setting of recent surgery on 04/13/2023 for right total shoulder arthroplasty .  Patient has been hearing music when nothing is playing and is feeling like things are crawling all over him. Patient had otherwise been recovering fairly well from his procedure the wife is concerned about swelling of the right hand.  Patient is still in a sling/splint from his procedure.  At baseline, patient is very active and ambulates without assistive devices.  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 Patient has no fever or chills. ED course and data review: Soft BP 107/56 with otherwise normal vitals. CBC notable for hemoglobin 7.4, down from 9.4 a couple months prior, normal platelets CMP notable for creatinine 3.24, up from baseline of 1.91 a couple months prior with bicarb 20 and potassium 5.2. EKG, personally viewed and interpreted showing NSR with first-degree AV block.  Rate 89 CT head nonacute X-ray left knee nontraumatic, moderate osteoarthritis and chondrocalcinosis. Right  upper extremity venous ultrasound negative for DVT right internal jugular and right subclavian vein  Patient underwent arthrocentesis, result pending:***   Review of Systems: {ROS_Text:[id number]}  Past Medical History:  Diagnosis Date  . Arthritis   . Benign hypertensive renal disease   . Biceps tendon rupture, right, initial encounter   . COVID-19   . GERD (gastroesophageal reflux disease)   . Heartburn   . History of kidney stones   . History of retinal detachment   . Hyperlipidemia   . Hypertension   . Hypothyroidism   . Infraspinatus tendon tear, right, initial encounter   . Melanoma (HCC)    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 approximately 10-15 years ago  . Myelofibrosis (HCC)   . Prostate hypertrophy   . 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 Stroke [facility]) 11/2007   R brain subcortical infarct   Past Surgical History:  Procedure Laterality Date  . ASPIRATION / INJECTION RENAL CYST  07/08/2015  . BACK SURGERY     approx 20- 25 years ago  . COLONOSCOPY    . EYE SURGERY     cataract both eyes  . GAS INSERTION  08/11/2011   Procedure: INSERTION OF GAS;  Surgeon: Sherrie George, MD;  Location: [facility] OR;  Service: Ophthalmology;  Laterality: Right;  [id number]  . REVERSE SHOULDER ARTHROPLASTY Right 04/13/2023   Procedure: REVERSE SHOULDER ARTHROPLASTY;  Surgeon: Christena Flake, MD;  Location: [facility] ORS;  Service: Orthopedics;  Laterality: Right;  . SCLERAL BUCKLE  08/11/2011   Procedure: SCLERAL BUCKLE;  Surgeon: Beulah Gandy  Ashley Royalty, MD;  Location: [facility] OR;  Service: Ophthalmology;  Laterality: Right;  Marland Kitchen VARICOSE VEIN SURGERY     Social History:  reports that he quit smoking about 12 years ago. His smoking use included cigarettes. He has never used smokeless tobacco. He reports that he does not drink alcohol and does not use drugs.  Allergies  Allergen Reactions  . Meloxicam Nausea And Vomiting    Family History  Problem Relation Age of Onset  . Heart  disease Father   . Diabetes Son   . Kidney disease Neg Hx   . Prostate cancer Neg Hx     Prior to Admission medications   Medication Sig Start Date End Date Taking? Authorizing Provider  acetaminophen (TYLENOL) 500 MG tablet Take 1,000 mg by mouth every 6 (six) hours as needed for mild pain (pain score 1-3).    [provider]  amLODipine (NORVASC) 10 MG tablet Take 1 tablet (10 mg total) by mouth every evening. 09/13/22   Johnson, Megan P, DO  azelastine (ASTELIN) 0.1 % nasal spray Place 1 spray into both nostrils 2 (two) times daily. Use in each nostril as directed 03/28/19   Particia Nearing, PA-C  Calcium Citrate-Vitamin D (CITRACAL + D PO) Take 1 tablet by mouth in the morning.    [provider]  Cholecalciferol (D3-1000 PO) Take 1,000 Units by mouth in the morning.    [provider]  clopidogrel (PLAVIX) 75 MG tablet TAKE ONE TABLET BY MOUTH EVERY DAY 02/17/23   Johnson, Megan P, DO  Cyanocobalamin (VITAMIN B-12 PO) Take 1 tablet by mouth in the morning.    [provider]  dapagliflozin propanediol (FARXIGA) 10 MG TABS tablet Take 10 mg by mouth. 04/05/23 04/04/24  [provider]  DULoxetine (CYMBALTA) 20 MG capsule Take 1 capsule (20 mg total) by mouth at bedtime. TAKE 1 CAPSULE BY MOUTH EVERY DAY 04/13/23   Poggi, Excell Seltzer, MD  gabapentin (NEURONTIN) 300 MG capsule Take 600 mg by mouth 3 (three) times daily.    [provider]  ketoconazole (NIZORAL) 2 % cream Apply twice daily to affected area on buttocks until clear 03/29/23   Deirdre Evener, MD  levothyroxine (SYNTHROID) 25 MCG tablet TAKE 1 TABLET EVERY DAY ON EMPTY STOMACHWITH A GLASS OF WATER AT LEAST 30-60 MINBEFORE BREAKFAST 01/04/23   Johnson, Megan P, DO  lidocaine (LIDODERM) 5 % Place 1 patch onto the skin daily. Remove & Discard patch within 12 hours or as directed by MD Patient taking differently: Place 1 patch onto the skin daily as needed. Remove & Discard patch  within 12 hours or as directed by MD 03/20/23   Olevia Perches P, DO  losartan (COZAAR) 100 MG tablet Take 1 tablet (100 mg total) by mouth every morning. 09/13/22   Johnson, Megan P, DO  meclizine (ANTIVERT) 25 MG tablet TAKE 1 TABLET BY MOUTH THREE TIMES DAILYAS NEEDED FOR DIZZINESS 02/10/23   Cannady, Corrie Dandy T, NP  Multiple Vitamin (MULITIVITAMIN WITH MINERALS) TABS Take 1 tablet by mouth in the morning.    [provider]  Naproxen Sodium 220 MG CAPS Take 440 mg by mouth daily as needed (pain).    [provider]  omeprazole (PRILOSEC) 40 MG capsule Take 1 capsule (40 mg total) by mouth at bedtime. 04/13/23   Poggi, Excell Seltzer, MD  oxyCODONE (ROXICODONE) 5 MG immediate release tablet Take 1-2 tablets (5-10 mg total) by mouth every 4 (four) hours as needed for moderate pain (  pain score 4-6) or severe pain (pain score 7-10). 04/13/23   Poggi, Excell Seltzer, MD  simvastatin (ZOCOR) 40 MG tablet Take 1 tablet (40 mg total) by mouth daily. 09/13/22   Johnson, Megan P, DO  tadalafil (CIALIS) 5 MG tablet Take 1 tablet (5 mg total) by mouth daily. Patient taking differently: Take 5 mg by mouth at bedtime. 09/13/22   Johnson, Megan P, DO  tamsulosin (FLOMAX) 0.4 MG CAPS capsule Take 2 capsules (0.8 mg total) by mouth every evening. 04/13/23   Poggi, Excell Seltzer, MD  terbinafine (LAMISIL) 250 MG tablet Take 1 tablet (250 mg total) by mouth every evening. 04/13/23   Poggi, Excell Seltzer, MD  zinc gluconate 50 MG tablet Take 50 mg by mouth in the morning.    [provider]    Physical Exam: Vitals:   04/17/23 1514 04/17/23 1749 04/17/23 1800 04/17/23 2030  BP:  (!) 149/65 (!) 113/55 (!) 152/77  Pulse:  88 88 94  Resp:  20 20 20   Temp:      TempSrc:      SpO2:  95% 96% 95%  Weight: 82 kg     Height: 5\' 10"  (1.778 m)      Physical Exam Vitals and nursing note reviewed.  Constitutional:      General: He is not in acute distress.    Comments: Elderly male, appears somewhat dehydrated  HENT:     Head:  Normocephalic and atraumatic.  Cardiovascular:     Rate and Rhythm: Normal rate and regular rhythm.     Heart sounds: Normal heart sounds.  Pulmonary:     Effort: Pulmonary effort is normal.     Breath sounds: Normal breath sounds.  Abdominal:     Palpations: Abdomen is soft.     Tenderness: There is no abdominal tenderness.  Neurological:     Mental Status: Mental status is at baseline.     Labs on Admission: I have personally reviewed following labs and imaging studies  CBC: Recent Labs  Lab 04/12/23 0939 04/17/23 2017  WBC  --  8.6  NEUTROABS  --  6.2  HGB 9.4* 7.4*  HCT 30.7* 24.5*  MCV  --  96.1  PLT  --  240   Basic Metabolic Panel: Recent Labs  Lab 04/17/23 2017  NA 136  K 5.2*  CL 108  CO2 20*  GLUCOSE 129*  BUN 69*  CREATININE 3.24*  CALCIUM 7.9*   GFR: Estimated Creatinine Clearance: 18.1 mL/min (A) (by C-G formula based on SCr of 3.24 mg/dL (H)). Liver Function Tests: Recent Labs  Lab 04/17/23 2017  AST 17  ALT 9  ALKPHOS 89  BILITOT 0.9  PROT 6.1*  ALBUMIN 3.0*   No results for input(s): "LIPASE", "AMYLASE" in the last 168 hours. Recent Labs  Lab 04/17/23 2017  AMMONIA 18   Coagulation Profile: No results for input(s): "INR", "PROTIME" in the last 168 hours. Cardiac Enzymes: No results for input(s): "CKTOTAL", "CKMB", "CKMBINDEX", "TROPONINI" in the last 168 hours. BNP (last 3 results) No results for input(s): "PROBNP" in the last 8760 hours. HbA1C: No results for input(s): "HGBA1C" in the last 72 hours. CBG: No results for input(s): "GLUCAP" in the last 168 hours. Lipid Profile: No results for input(s): "CHOL", "HDL", "LDLCALC", "TRIG", "CHOLHDL", "LDLDIRECT" in the last 72 hours. Thyroid Function Tests: No results for input(s): "TSH", "T4TOTAL", "FREET4", "T3FREE", "THYROIDAB" in the last 72 hours. Anemia Panel: No results for input(s): "VITAMINB12", "FOLATE", "FERRITIN", "TIBC", "IRON", "RETICCTPCT"  in the last 72  hours. Urine analysis:    Component Value Date/Time   COLORURINE YELLOW (A) 04/17/2023 2052   APPEARANCEUR CLOUDY (A) 04/17/2023 2052   APPEARANCEUR Clear 09/13/2022 1342   LABSPEC 1.018 04/17/2023 2052   PHURINE 5.0 04/17/2023 2052   GLUCOSEU 50 (A) 04/17/2023 2052   HGBUR NEGATIVE 04/17/2023 2052   BILIRUBINUR NEGATIVE 04/17/2023 2052   BILIRUBINUR Negative 09/13/2022 1342   KETONESUR NEGATIVE 04/17/2023 2052   PROTEINUR >=300 (A) 04/17/2023 2052   NITRITE NEGATIVE 04/17/2023 2052   LEUKOCYTESUR NEGATIVE 04/17/2023 2052    Radiological Exams on Admission: US Venous Img Upper Right (DVT Study)  Result Date: 04/17/2023 CLINICAL DATA:  Hand swelling and pain. EXAM: RIGHT UPPER EXTREMITY VENOUS DOPPLER ULTRASOUND TECHNIQUE: Gray-scale sonography with graded compression, as well as color Doppler and duplex ultrasound were performed to evaluate the upper extremity deep venous system from the level of the subclavian vein and including the jugular, axillary, basilic, radial, ulnar and upper cephalic vein. Spectral Doppler was utilized to evaluate flow at rest and with distal augmentation maneuvers. COMPARISON:  None Available. FINDINGS: Contralateral Subclavian Vein: Respiratory phasicity is normal and symmetric with the symptomatic side. No evidence of thrombus. Normal compressibility. Internal Jugular Vein: No evidence of thrombus. Normal compressibility, respiratory phasicity and response to augmentation. Subclavian Vein: No evidence of thrombus. Normal compressibility, respiratory phasicity and response to augmentation. Axillary Vein: Unable to evaluate, as per the ultrasound technologist, secondary to recent surgery, limited patient positioning and patient altered mental status. Cephalic Vein: Unable to evaluate, as per the ultrasound technologist, secondary to recent surgery, limited patient positioning and patient altered mental status. Basilic Vein: Unable to evaluate, as per the ultrasound  technologist, secondary to recent surgery, limited patient positioning and patient altered mental status. Brachial Veins: Unable to evaluate, as per the ultrasound technologist, secondary to recent surgery, limited patient positioning and patient altered mental status. Radial Veins: Unable to evaluate, as per the ultrasound technologist, secondary to recent surgery, limited patient positioning and patient altered mental status. Ulnar Veins: Unable to evaluate, as per the ultrasound technologist, secondary to recent surgery, limited patient positioning and patient altered mental status. Venous Reflux:  None visualized. Other Findings:  None visualized. IMPRESSION: Markedly limited study, as described above, without evidence of DVT within the RIGHT internal jugular vein and RIGHT subclavian vein. Electronically Signed   By: Aram Candela M.D.   On: 04/17/2023 21:42   CT Head Wo Contrast  Result Date: 04/17/2023 CLINICAL DATA:  Mental status change, unknown cause Patient reports hearing a "pop" when sitting down in chair yesterday. Also had a reverse right shoulder replacement on 11/7- arm in sling with cooling system. EXAM: CT HEAD WITHOUT CONTRAST TECHNIQUE: Contiguous axial images were obtained from the base of the skull through the vertex without intravenous contrast. RADIATION DOSE REDUCTION: This exam was performed according to the departmental dose-optimization program which includes automated exposure control, adjustment of the mA and/or kV according to patient size and/or use of iterative reconstruction technique. COMPARISON:  MRI head 04/12/2022 FINDINGS: Brain: Patchy and confluent areas of decreased attenuation are noted throughout the deep and periventricular white matter of the cerebral hemispheres bilaterally, compatible with chronic microvascular ischemic disease. No evidence of large-territorial acute infarction. No parenchymal hemorrhage. No mass lesion. No extra-axial collection. No mass  effect or midline shift. No hydrocephalus. Basilar cisterns are patent. Vascular: No hyperdense vessel. Atherosclerotic calcifications are present within the cavernous internal carotid arteries. Skull: No acute fracture or focal lesion.  Sinuses/Orbits: Bilateral maxillary sinus mucosal thickening. Otherwise paranasal sinuses and mastoid air cells are clear. Right scleral buckle. Bilateral lens replacement. Otherwise the orbits are unremarkable. Other: None. IMPRESSION: No acute intracranial abnormality. Electronically Signed   By: Tish Frederickson M.D.   On: 04/17/2023 21:40   DG Knee Complete 4 Views Left  Result Date: 04/17/2023 CLINICAL DATA:  Knee pain after injury.  Heard a pop. EXAM: LEFT KNEE - COMPLETE 4+ VIEW COMPARISON:  None Available. FINDINGS: No evidence of fracture, dislocation, or significant joint effusion. Moderate osteoarthritis with tricompartmental peripheral spurring. Lateral tibiofemoral joint space narrowing. 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 Peripheral vascular calcifications. IMPRESSION: 1. No fracture or subluxation of the left knee. 2. Moderate osteoarthritis. 3. Chondrocalcinosis. Electronically Signed   By: Narda Rutherford M.D.   On: 04/17/2023 17:45     Data Reviewed: Relevant notes from primary care and specialist visits, past discharge summaries as available in EHR, including Care Everywhere. Prior diagnostic testing as pertinent to current admission diagnoses Updated medications and problem lists for reconciliation ED course, including vitals, labs, imaging, treatment and response to treatment Triage notes, nursing and pharmacy notes and ED provider's notes Notable results as noted in HPI   Assessment and Plan: Hallucinations Suspect delirium Presents with auditory and tactile hallucinations, starting 1 day postop CT head nonacute Suspect in part related to dehydration, given AKI Trial of hydration Neurologic checks Delirium precautions and fall  precautions  Acute renal failure superimposed on stage 3b chronic kidney disease (HCC) Hyperkalemia Creatinine 3.24, up from baseline of 1.91 a couple months prior with bicarb 20 and potassium 5.2 Hydrate with 1 L bolus Encourage oral rehydration Monitor renal function and avoid nephrotoxins  Acute pain of left knee Primary osteoarthritis of the left knee Patient felt a pop in the knee the day prior and now has pain X-ray showing osteoarthritis, no significant effusion otherwise nonacute Follow-up arthrocentesis   Acute on chronic anemia hemoglobin 7.4, down from 9.4 a couple months prior, Receives erythropoietin by oncology Serial H&H and transfuse if necessary  S/P reverse total shoulder arthroplasty, 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 Observation status is judged to be reasonable and necessary in order to provide the required intensity of service to ensure the patient's safety. The patient's presenting symptoms, physical exam findings, and initial radiographic and laboratory data in the context of their medical condition is felt to place them at decreased risk for further clinical deterioration. Furthermore, it is anticipated that the patient will be medically stable for discharge from the hospital within 2 midnights of admission.   Author: Andris Baumann, MD 04/17/2023 10:45 PM  For on call review  [email or web address].

## 2023-04-17 NOTE — ED Provider Notes (Signed)
[facility] Provider Note    Event Date/Time   First MD Initiated Contact with Patient 04/17/23 1605     (approximate)   History   Knee Pain and Altered Mental Status   HPI  [name] is a 82 y.o. male with PMH of HTN, stroke presents for evaluation of left knee pain.  Patient was attempting to sit from a chair and go to the bathroom when he twisted his knee and heard a pop.  Since yesterday he has had significant pain to the knee, been unable to bear weight on it and has noticed some swelling over the joint.  Patient also had shoulder surgery on the seventh and reports he is doing well in terms of his recovery.  Patient's family raises some concerns about recent audio and visual hallucinations that began about 3 days ago.  Patient's wife states he has been hearing music that has been playing and has been concerned about things crawling on him.   Physical Exam   Triage Vital Signs: ED Triage Vitals  Encounter Vitals Group     BP 04/17/23 1513 (!) 107/56     Systolic BP Percentile --      Diastolic BP Percentile --      Pulse Rate 04/17/23 1513 77     Resp 04/17/23 1513 18     Temp 04/17/23 1513 98.7 F (37.1 C)     Temp Source 04/17/23 1513 Oral     SpO2 04/17/23 1513 92 %     Weight 04/17/23 1514 180 lb 12.4 oz (82 kg)     Height 04/17/23 1514 5\' 10"  (1.778 m)     Head Circumference --      Peak Flow --      Pain Score 04/17/23 1513 10     Pain Loc --      Pain Education --      Exclude from Growth Chart --     Most recent vital signs: Vitals:   04/17/23 2030 04/18/23 0043  BP: (!) 152/77 135/76  Pulse: 94 98  Resp: 20   Temp:    SpO2: 95% 91%    General: Awake, no distress.  CV:  Good peripheral perfusion.  Resp:  Normal effort.  Abd:  No distention.  Other:  Left knee is swollen when compared to the right, no overlying skin changes, bruising or erythema, tender to palpation surrounding the entire joint, unable to perform any ROM  due to pain.  Very difficult to assess ligaments and meniscus due to patient's level of pain.   ED Results / Procedures / Treatments   Labs (all labs ordered are listed, but only abnormal results are displayed) Labs Reviewed  URINALYSIS, ROUTINE W REFLEX MICROSCOPIC - Abnormal; Notable for the following components:      Result Value   Color, Urine YELLOW (*)    APPearance CLOUDY (*)    Glucose, UA 50 (*)    Protein, ur >=300 (*)    Bacteria, UA RARE (*)    All other components within normal limits  COMPREHENSIVE METABOLIC PANEL - Abnormal; Notable for the following components:   Potassium 5.2 (*)    CO2 20 (*)    Glucose, Bld 129 (*)    BUN 69 (*)    Creatinine, Ser 3.24 (*)    Calcium 7.9 (*)    Total Protein 6.1 (*)    Albumin 3.0 (*)    GFR, Estimated 18 (*)    All  other components within normal limits  CBC WITH DIFFERENTIAL/PLATELET - Abnormal; Notable for the following components:   RBC 2.55 (*)    Hemoglobin 7.4 (*)    HCT 24.5 (*)    RDW 18.7 (*)    nRBC 0.7 (*)    Lymphs Abs 0.5 (*)    Monocytes Absolute 1.4 (*)    Abs Immature Granulocytes 0.46 (*)    All other components within normal limits  BODY FLUID CULTURE W GRAM STAIN  AMMONIA  GLUCOSE, BODY FLUID OTHER            PROTEIN, BODY FLUID (OTHER)  SYNOVIAL CELL COUNT + DIFF, W/ CRYSTALS  URIC ACID, BODY FLUID  CBC  CBC  CBC  BASIC METABOLIC PANEL     EKG  ED provider interpretation: Sinus rhythm with first-degree AV block.  Vent. rate 89 BPM PR interval 222 ms QRS duration 82 ms QT/QTcB 350/425 ms P-R-T axes 78 51 80   RADIOLOGY  CT head, right upper extremity ultrasound left knee x-rays obtained, interpreted the images as well as reviewed the radiologist report.  I do not believe there are any acute abnormalities but there is some swelling within the joint.  No acute intracranial abnormalities.  Limited assessment of the upper extremity blood vessels due to patient's recent shoulder surgery  but there was no evidence of DVT in the right subclavian and right internal jugular.  PROCEDURES:  Critical Care performed: No  .Joint Aspiration/Arthrocentesis  Date/Time: 04/18/2023 12:51 AM  Performed by: Cameron Ali, PA-C Authorized by: Cameron Ali, PA-C   Consent:    Consent obtained:  Verbal   Consent given by:  Patient and spouse   Risks, benefits, and alternatives were discussed: yes     Risks discussed:  Bleeding, infection, pain and incomplete drainage   Alternatives discussed:  No treatment Universal protocol:    Patient identity confirmed:  Verbally with patient Location:    Location:  Knee   Knee:  L knee Anesthesia:    Anesthesia method:  Local infiltration   Local anesthetic:  Lidocaine 1% w/o epi Procedure details:    Preparation: Patient was prepped and draped in usual sterile fashion     Needle gauge:  18 G   Ultrasound guidance: no     Approach:  Lateral   Aspirate amount:  30 mL   Aspirate characteristics:  Blood-tinged and cloudy Post-procedure details:    Dressing:  Adhesive bandage   Procedure completion:  Tolerated well, no immediate complications    MEDICATIONS ORDERED IN ED: Medications  amLODipine (NORVASC) tablet 10 mg (has no administration in time range)  simvastatin (ZOCOR) tablet 40 mg (has no administration in time range)  losartan (COZAAR) tablet 100 mg (has no administration in time range)  DULoxetine (CYMBALTA) DR capsule 20 mg (has no administration in time range)  levothyroxine (SYNTHROID) tablet 25 mcg (has no administration in time range)  pantoprazole (PROTONIX) EC tablet 40 mg (has no administration in time range)  tamsulosin (FLOMAX) capsule 0.8 mg (has no administration in time range)  clopidogrel (PLAVIX) tablet 75 mg (has no administration in time range)  enoxaparin (LOVENOX) injection 30 mg (has no administration in time range)  acetaminophen (TYLENOL) tablet 650 mg (has no administration in time range)     Or  acetaminophen (TYLENOL) suppository 650 mg (has no administration in time range)  ondansetron (ZOFRAN) tablet 4 mg (has no administration in time range)    Or  ondansetron (ZOFRAN) injection 4 mg (  has no administration in time range)  sodium chloride 0.9 % bolus 1,000 mL (has no administration in time range)  HYDROcodone-acetaminophen (NORCO/VICODIN) 5-325 MG per tablet 1-2 tablet (has no administration in time range)  acetaminophen (TYLENOL) tablet 650 mg (650 mg Oral Given 04/17/23 1801)  ondansetron (ZOFRAN) injection 4 mg (4 mg Intravenous Given 04/17/23 2208)  morphine (PF) 4 MG/ML injection 4 mg (4 mg Intravenous Given 04/17/23 2208)  sodium chloride 0.9 % bolus 1,000 mL (1,000 mLs Intravenous New Bag/Given 04/17/23 2206)  lidocaine (PF) (XYLOCAINE) 1 % injection 10 mL (10 mLs Other Given 04/17/23 2230)     IMPRESSION / MDM / ASSESSMENT AND PLAN / ED COURSE  I reviewed the triage vital signs and the nursing notes.                             82 year old male presents to the ED for evaluation of knee pain and altered mental status.  Vital signs stable, patient does report pain on exam.  Differential diagnosis includes, but is not limited to, knee fracture, knee dislocation, septic joint, meniscus injury, ligament injury, muscle strain, withdrawal symptoms, acute delirium, UTI.  Patient's presentation is most consistent with acute presentation with potential threat to life or bodily function.  CBC notable for anemia.  CMP shows hyperkalemia, elevated BUN, elevated creatinine, elevated calcium, decreased protein and decreased albumin.  Ammonia levels WNL.  EKG shows sinus rhythm with first-degree AV block.  Patient and patient's family raise concerns about some swelling in his right hand.  I believe this is dependent edema as his hand was not properly placed in the shoulder immobilizer however I will complete an upper extremity ultrasound to rule out a DVT.  Ultrasound tech  had a very difficult time getting images due to patient being unable to move his arm as he is a couple days out from shoulder replacement.  They were able to evaluate the internal jugular and subclavian vein which did not have blood clots.  CT head obtained which was negative for any acute abnormalities.  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  Patient on the nature of patient's injury with a twisting mechanism, I am most suspicious for meniscal injury.  He also has a pretty large joint effusion that is warm on palpation so a knee arthrocentesis was performed.  Please see procedure note for further details.  Results for the synovial fluid studies are pending.  Patient will be admitted for further workup of his altered mental status as well as his acute kidney injury.   FINAL CLINICAL IMPRESSION(S) / ED DIAGNOSES   Final diagnoses:  Altered mental status, unspecified altered mental status type  AKI (acute kidney injury) (HCC)     Rx / DC Orders   ED Discharge Orders     None        Note:  This document was prepared using Dragon voice recognition software and may include unintentional dictation errors.   Cameron Ali, PA-C 04/18/23 2440    Corena Herter, MD 04/18/23 [phone]

## 2023-04-17 NOTE — ED Notes (Signed)
Pt  c/o pain in the R hand distal from surgery site. Noted pt had wrist with ulnar deviation hanging out of splint. Repositioned wrist into correct anatomical position and EDP notified of pt's pain.

## 2023-04-17 NOTE — ED Provider Notes (Signed)
Shared visit *** ?

## 2023-04-17 NOTE — Assessment & Plan Note (Signed)
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

## 2023-04-17 NOTE — Assessment & Plan Note (Signed)
-

## 2023-04-17 NOTE — H&P (Incomplete)
History and Physical    Patient: [name]:[id number] DOB: 25-Oct-1940 DOA: 04/17/2023 DOS: the patient was seen and examined on 04/17/2023 PCP: [name], DO  Patient coming from: Home  Chief Complaint:  Chief Complaint  Patient presents with   Knee Pain   Altered Mental Status    HPI: [name] is a 82 y.o. male with medical history significant for Who presents to the ED primarily for left knee pain and swelling after he twisted his knee when attempting to transfer from a chair(felt up pop).  Family also has a concern for auditory and tactile hallucinations that started 3 days prior in the setting of recent surgery on 04/13/2023 for right total shoulder arthroplasty .  Patient has been hearing music when nothing is playing and is feeling like things are crawling all over him. 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 Patient has no fever or chills. ED course and data review: Soft BP 107/56 with otherwise normal vitals. CBC notable for hemoglobin 7.4, down from 9.4 a couple months prior, normal platelets CMP notable for creatinine 3.24, up from baseline of 1.91 a couple months prior with bicarb 20 and potassium 5.2. EKG, personally viewed and interpreted showing NSR with first-degree AV block.  Rate 89 CT head nonacute X-ray left knee nontraumatic, moderate osteoarthritis and chondrocalcinosis. Right upper extremity venous ultrasound negative for DVT right internal jugular and right subclavian vein  Patient underwent arthrocentesis with the following findings:***   Review of Systems: {ROS_Text:26778}  Past Medical History:  Diagnosis Date   Arthritis    Benign hypertensive renal disease    Biceps tendon rupture, right, initial encounter    COVID-19    GERD  (gastroesophageal reflux disease)    Heartburn    History of kidney stones    History of retinal detachment    Hyperlipidemia    Hypertension    Hypothyroidism    Infraspinatus tendon tear, right, initial encounter    Melanoma (HCC)    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 approximately 10-15 years ago   Myelofibrosis (HCC)    Prostate hypertrophy    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   right forearm, [facility]   Stroke (HCC) 11/2007   R brain subcortical infarct   Past Surgical History:  Procedure Laterality Date   ASPIRATION / INJECTION RENAL CYST  07/08/2015   BACK SURGERY     approx 20- 25 years ago   COLONOSCOPY     EYE SURGERY     cataract both eyes   GAS INSERTION  08/11/2011   Procedure: INSERTION OF GAS;  Surgeon: Sherrie George, MD;  Location: [facility] OR;  Service: Ophthalmology;  Laterality: Right;  [id number]   REVERSE SHOULDER ARTHROPLASTY Right 04/13/2023   Procedure: REVERSE SHOULDER ARTHROPLASTY;  Surgeon: Christena Flake, MD;  Location: [facility] ORS;  Service: Orthopedics;  Laterality: Right;   SCLERAL BUCKLE  08/11/2011   Procedure: SCLERAL BUCKLE;  Surgeon: Sherrie George, MD;  Location: [facility] OR;  Service: Ophthalmology;  Laterality: Right;   VARICOSE VEIN SURGERY     Social History:  reports that he quit smoking about 12 years ago. His smoking use included cigarettes. He has never used smokeless tobacco. He reports that he does not drink alcohol and does not use drugs.  Allergies  Allergen Reactions   Meloxicam Nausea And Vomiting    Family History  Problem Relation Age of Onset   Heart disease Father    Diabetes Son    Kidney disease Neg Hx    Prostate cancer Neg Hx     Prior to Admission medications   Medication Sig Start Date End Date Taking? Authorizing Provider  acetaminophen (TYLENOL) 500 MG tablet Take 1,000 mg by mouth every 6 (six) hours as needed for mild pain (pain score 1-3).    [provider]  amLODipine (NORVASC) 10 MG tablet Take 1  tablet (10 mg total) by mouth every evening. 09/13/22   Johnson, Megan P, DO  azelastine (ASTELIN) 0.1 % nasal spray Place 1 spray into both nostrils 2 (two) times daily. Use in each nostril as directed 03/28/19   Particia Nearing, PA-C  Calcium Citrate-Vitamin D (CITRACAL + D PO) Take 1 tablet by mouth in the morning.    [provider]  Cholecalciferol (D3-1000 PO) Take 1,000 Units by mouth in the morning.    [provider]  clopidogrel (PLAVIX) 75 MG tablet TAKE ONE TABLET BY MOUTH EVERY DAY 02/17/23   Johnson, Megan P, DO  Cyanocobalamin (VITAMIN B-12 PO) Take 1 tablet by mouth in the morning.    [provider]  dapagliflozin propanediol (FARXIGA) 10 MG TABS tablet Take 10 mg by mouth. 04/05/23 04/04/24  [provider]  DULoxetine (CYMBALTA) 20 MG capsule Take 1 capsule (20 mg total) by mouth at bedtime. TAKE 1 CAPSULE BY MOUTH EVERY DAY 04/13/23   Poggi, Excell Seltzer, MD  gabapentin (NEURONTIN) 300 MG capsule Take 600 mg by mouth 3 (three) times daily.    [provider]  ketoconazole (NIZORAL) 2 % cream Apply twice daily to affected area on buttocks until clear 03/29/23   Deirdre Evener, MD  levothyroxine (SYNTHROID) 25 MCG tablet TAKE 1 TABLET EVERY DAY ON EMPTY STOMACHWITH A GLASS OF WATER AT LEAST 30-60 MINBEFORE BREAKFAST 01/04/23   Johnson, Megan P, DO  lidocaine (LIDODERM) 5 % Place 1 patch onto the skin daily. Remove & Discard patch within 12 hours or as directed by MD Patient taking differently: Place 1 patch onto the skin daily as needed. Remove & Discard patch within 12 hours or as directed by MD 03/20/23   Olevia Perches P, DO  losartan (COZAAR) 100 MG tablet Take 1 tablet (100 mg total) by mouth every morning. 09/13/22   Johnson, Megan P, DO  meclizine (ANTIVERT) 25 MG tablet TAKE 1 TABLET BY MOUTH THREE TIMES DAILYAS NEEDED FOR DIZZINESS 02/10/23   Cannady, Corrie Dandy T, NP  Multiple Vitamin (MULITIVITAMIN WITH MINERALS) TABS Take 1 tablet by  mouth in the morning.    [provider]  Naproxen Sodium 220 MG CAPS Take 440 mg by mouth daily as needed (pain).    [provider]  omeprazole (PRILOSEC) 40 MG capsule Take 1 capsule (40 mg total) by mouth at bedtime. 04/13/23   Poggi, Excell Seltzer, MD  oxyCODONE (ROXICODONE) 5 MG immediate release tablet Take 1-2 tablets (5-10 mg total) by mouth every 4 (four) hours as needed for moderate pain (pain score 4-6) or severe pain (pain score 7-10). 04/13/23   Poggi, Excell Seltzer, MD  simvastatin (ZOCOR) 40 MG tablet Take 1 tablet (40 mg total) by mouth daily. 09/13/22   Johnson, Megan P, DO  tadalafil (CIALIS) 5 MG tablet Take 1 tablet (5 mg total) by mouth daily. Patient taking differently: Take 5 mg by  mouth at bedtime. 09/13/22   Johnson, Megan P, DO  tamsulosin (FLOMAX) 0.4 MG CAPS capsule Take 2 capsules (0.8 mg total) by mouth every evening. 04/13/23   Poggi, Excell Seltzer, MD  terbinafine (LAMISIL) 250 MG tablet Take 1 tablet (250 mg total) by mouth every evening. 04/13/23   Poggi, Excell Seltzer, MD  zinc gluconate 50 MG tablet Take 50 mg by mouth in the morning.    [provider]    Physical Exam: Vitals:   04/17/23 1514 04/17/23 1749 04/17/23 1800 04/17/23 2030  BP:  (!) 149/65 (!) 113/55 (!) 152/77  Pulse:  88 88 94  Resp:  20 20 20   Temp:      TempSrc:      SpO2:  95% 96% 95%  Weight: 82 kg     Height: 5\' 10"  (1.778 m)      Physical Exam  Labs on Admission: I have personally reviewed following labs and imaging studies  CBC: Recent Labs  Lab 04/12/23 0939 04/17/23 2017  WBC  --  8.6  NEUTROABS  --  6.2  HGB 9.4* 7.4*  HCT 30.7* 24.5*  MCV  --  96.1  PLT  --  240   Basic Metabolic Panel: Recent Labs  Lab 04/17/23 2017  NA 136  K 5.2*  CL 108  CO2 20*  GLUCOSE 129*  BUN 69*  CREATININE 3.24*  CALCIUM 7.9*   GFR: Estimated Creatinine Clearance: 18.1 mL/min (A) (by C-G formula based on SCr of 3.24 mg/dL (H)). Liver Function Tests: Recent Labs  Lab  04/17/23 2017  AST 17  ALT 9  ALKPHOS 89  BILITOT 0.9  PROT 6.1*  ALBUMIN 3.0*   No results for input(s): "LIPASE", "AMYLASE" in the last 168 hours. Recent Labs  Lab 04/17/23 2017  AMMONIA 18   Coagulation Profile: No results for input(s): "INR", "PROTIME" in the last 168 hours. Cardiac Enzymes: No results for input(s): "CKTOTAL", "CKMB", "CKMBINDEX", "TROPONINI" in the last 168 hours. BNP (last 3 results) No results for input(s): "PROBNP" in the last 8760 hours. HbA1C: No results for input(s): "HGBA1C" in the last 72 hours. CBG: No results for input(s): "GLUCAP" in the last 168 hours. Lipid Profile: No results for input(s): "CHOL", "HDL", "LDLCALC", "TRIG", "CHOLHDL", "LDLDIRECT" in the last 72 hours. Thyroid Function Tests: No results for input(s): "TSH", "T4TOTAL", "FREET4", "T3FREE", "THYROIDAB" in the last 72 hours. Anemia Panel: No results for input(s): "VITAMINB12", "FOLATE", "FERRITIN", "TIBC", "IRON", "RETICCTPCT" in the last 72 hours. Urine analysis:    Component Value Date/Time   COLORURINE YELLOW (A) 04/17/2023 2052   APPEARANCEUR CLOUDY (A) 04/17/2023 2052   APPEARANCEUR Clear 09/13/2022 1342   LABSPEC 1.018 04/17/2023 2052   PHURINE 5.0 04/17/2023 2052   GLUCOSEU 50 (A) 04/17/2023 2052   HGBUR NEGATIVE 04/17/2023 2052   BILIRUBINUR NEGATIVE 04/17/2023 2052   BILIRUBINUR Negative 09/13/2022 1342   KETONESUR NEGATIVE 04/17/2023 2052   PROTEINUR >=300 (A) 04/17/2023 2052   NITRITE NEGATIVE 04/17/2023 2052   LEUKOCYTESUR NEGATIVE 04/17/2023 2052    Radiological Exams on Admission: US Venous Img Upper Right (DVT Study)  Result Date: 04/17/2023 CLINICAL DATA:  Hand swelling and pain. EXAM: RIGHT UPPER EXTREMITY VENOUS DOPPLER ULTRASOUND TECHNIQUE: Gray-scale sonography with graded compression, as well as color Doppler and duplex ultrasound were performed to evaluate the upper extremity deep venous system from the level of the subclavian vein and including  the jugular, axillary, basilic, radial, ulnar and upper cephalic vein. Spectral Doppler was utilized to evaluate  flow at rest and with distal augmentation maneuvers. COMPARISON:  None Available. FINDINGS: Contralateral Subclavian Vein: Respiratory phasicity is normal and symmetric with the symptomatic side. No evidence of thrombus. Normal compressibility. Internal Jugular Vein: No evidence of thrombus. Normal compressibility, respiratory phasicity and response to augmentation. Subclavian Vein: No evidence of thrombus. Normal compressibility, respiratory phasicity and response to augmentation. Axillary Vein: Unable to evaluate, as per the ultrasound technologist, secondary to recent surgery, limited patient positioning and patient altered mental status. Cephalic Vein: Unable to evaluate, as per the ultrasound technologist, secondary to recent surgery, limited patient positioning and patient altered mental status. Basilic Vein: Unable to evaluate, as per the ultrasound technologist, secondary to recent surgery, limited patient positioning and patient altered mental status. Brachial Veins: Unable to evaluate, as per the ultrasound technologist, secondary to recent surgery, limited patient positioning and patient altered mental status. Radial Veins: Unable to evaluate, as per the ultrasound technologist, secondary to recent surgery, limited patient positioning and patient altered mental status. Ulnar Veins: Unable to evaluate, as per the ultrasound technologist, secondary to recent surgery, limited patient positioning and patient altered mental status. Venous Reflux:  None visualized. Other Findings:  None visualized. IMPRESSION: Markedly limited study, as described above, without evidence of DVT within the RIGHT internal jugular vein and RIGHT subclavian vein. Electronically Signed   By: Aram Candela M.D.   On: 04/17/2023 21:42   CT Head Wo Contrast  Result Date: 04/17/2023 CLINICAL DATA:  Mental status  change, unknown cause Patient reports hearing a "pop" when sitting down in chair yesterday. Also had a reverse right shoulder replacement on 11/7- arm in sling with cooling system. EXAM: CT HEAD WITHOUT CONTRAST TECHNIQUE: Contiguous axial images were obtained from the base of the skull through the vertex without intravenous contrast. RADIATION DOSE REDUCTION: This exam was performed according to the departmental dose-optimization program which includes automated exposure control, adjustment of the mA and/or kV according to patient size and/or use of iterative reconstruction technique. COMPARISON:  MRI head 04/12/2022 FINDINGS: Brain: Patchy and confluent areas of decreased attenuation are noted throughout the deep and periventricular white matter of the cerebral hemispheres bilaterally, compatible with chronic microvascular ischemic disease. No evidence of large-territorial acute infarction. No parenchymal hemorrhage. No mass lesion. No extra-axial collection. No mass effect or midline shift. No hydrocephalus. Basilar cisterns are patent. Vascular: No hyperdense vessel. Atherosclerotic calcifications are present within the cavernous internal carotid arteries. Skull: No acute fracture or focal lesion. Sinuses/Orbits: Bilateral maxillary sinus mucosal thickening. Otherwise paranasal sinuses and mastoid air cells are clear. Right scleral buckle. Bilateral lens replacement. Otherwise the orbits are unremarkable. Other: None. IMPRESSION: No acute intracranial abnormality. Electronically Signed   By: Tish Frederickson M.D.   On: 04/17/2023 21:40   DG Knee Complete 4 Views Left  Result Date: 04/17/2023 CLINICAL DATA:  Knee pain after injury.  Heard a pop. EXAM: LEFT KNEE - COMPLETE 4+ VIEW COMPARISON:  None Available. FINDINGS: No evidence of fracture, dislocation, or significant joint effusion. Moderate osteoarthritis with tricompartmental peripheral spurring. Lateral tibiofemoral joint space narrowing. There is  chondrocalcinosis. Peripheral vascular calcifications. IMPRESSION: 1. No fracture or subluxation of the left knee. 2. Moderate osteoarthritis. 3. Chondrocalcinosis. Electronically Signed   By: Narda Rutherford M.D.   On: 04/17/2023 17:45     Data Reviewed: Relevant notes from primary care and specialist visits, past discharge summaries as available in EHR, including Care Everywhere. Prior diagnostic testing as pertinent to current admission diagnoses Updated medications and problem lists for reconciliation  ED course, including vitals, labs, imaging, treatment and response to treatment Triage notes, nursing and pharmacy notes and ED provider's notes Notable results as noted in HPI   Assessment and Plan: Hallucinations Suspect delirium Presents with auditory and tactile hallucinations, starting 1 day postop CT head nonacute Suspect in part related to dehydration, given AKI Trial of hydration Neurologic checks Delirium precautions and fall precautions  Acute renal failure superimposed on stage 3b chronic kidney disease (HCC) Hyperkalemia Creatinine 3.24, up from baseline of 1.91 a couple months prior with bicarb 20 and potassium 5.2 Hydrate with 1 L bolus Encourage oral rehydration Monitor renal function and avoid nephrotoxins  Acute pain of left knee Primary osteoarthritis of the left knee Patient felt a pop in the knee the day prior and now has pain X-ray showing osteoarthritis, no significant effusion otherwise nonacute Follow-up arthrocentesis   Acute on chronic anemia hemoglobin 7.4, down from 9.4 a couple months prior, Receives erythropoietin by oncology Serial H&H and transfuse if necessary  S/P reverse total shoulder arthroplasty, 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 Observation status is judged to be reasonable and necessary in order to provide the required intensity of service to ensure the patient's safety. The patient's presenting symptoms, physical exam findings, and initial radiographic and laboratory data in the context of their medical condition is felt to place them at decreased risk for further clinical deterioration. Furthermore, it is anticipated that the patient will be medically stable for discharge from the hospital within 2 midnights of admission.   Author: Andris Baumann, MD 04/17/2023 10:45 PM  For on call review [email or web address].

## 2023-04-17 NOTE — ED Triage Notes (Signed)
Patient to ED via [facility] from home for left knee pain. Patient reports hearing a "pop" when sitting down in chair yesterday. Also had a reverse right shoulder replacement on 11/7- arm in sling with cooling system. No complaints with shoulder at this time. Aox4 but hard of hearing.

## 2023-04-17 NOTE — ED Provider Notes (Incomplete)
[facility] Provider Note    Event Date/Time   First MD Initiated Contact with Patient 04/17/23 1605     (approximate)   History   Knee Pain   HPI  [name] is a 82 y.o. male with PMH of HTN, stroke presents for evaluation of left knee pain.  Patient was attempting to sit from a chair and go to the bathroom when he twisted his knee and heard a pop.  Since yesterday he has had significant pain to the knee, been unable to bear weight on it and has noticed some swelling over the joint.  Patient also had shoulder surgery on the seventh and reports he is doing well in terms of his recovery.  Patient's family raises some concerns about recent audio and visual hallucinations that began about 3 days ago.  Patient's wife states he has been hearing music that has been playing and has been concerned about things crawling on him.   Physical Exam   Triage Vital Signs: ED Triage Vitals  Encounter Vitals Group     BP 04/17/23 1513 (!) 107/56     Systolic BP Percentile --      Diastolic BP Percentile --      Pulse Rate 04/17/23 1513 77     Resp 04/17/23 1513 18     Temp 04/17/23 1513 98.7 F (37.1 C)     Temp Source 04/17/23 1513 Oral     SpO2 04/17/23 1513 92 %     Weight 04/17/23 1514 180 lb 12.4 oz (82 kg)     Height 04/17/23 1514 5\' 10"  (1.778 m)     Head Circumference --      Peak Flow --      Pain Score 04/17/23 1513 10     Pain Loc --      Pain Education --      Exclude from Growth Chart --     Most recent vital signs: Vitals:   04/17/23 1513  BP: (!) 107/56  Pulse: 77  Resp: 18  Temp: 98.7 F (37.1 C)  SpO2: 92%    General: Awake, no distress.  CV:  Good peripheral perfusion.  Resp:  Normal effort.  Abd:  No distention.  Other:  Left knee is swollen when compared to the right, no overlying skin changes, bruising or erythema, tender to palpation surrounding the entire joint, unable to perform any ROM due to pain.  Very difficult to assess  ligaments and meniscus due to patient's level of pain.   ED Results / Procedures / Treatments   Labs (all labs ordered are listed, but only abnormal results are displayed) Labs Reviewed - No data to display   EKG  ***   RADIOLOGY  Left knee x-rays obtained, interpreted the images as well as reviewed the radiologist report.  I do not believe there are any acute abnormalities but there is some swelling within the joint.   PROCEDURES:  Critical Care performed: No  Procedures   MEDICATIONS ORDERED IN ED: Medications - No data to display   IMPRESSION / MDM / ASSESSMENT AND PLAN / ED COURSE  I reviewed the triage vital signs and the nursing notes.                              Differential diagnosis includes, but is not limited to, knee fracture, knee dislocation, meniscus injury, ligament injury, muscle strain, withdrawal symptoms, acute delirium,  UTI.  Patient's presentation is most consistent with {EM COPA:27473}  *** {If the patient is on the monitor, remove the brackets and asterisks on the sentence below and remember to document it as a Procedure as well. Otherwise delete the sentence below:1} {**The patient is on the cardiac monitor to evaluate for evidence of arrhythmia and/or significant heart rate changes.**} {Remember to include, when applicable, any/all of the following data: independent review of imaging independent review of labs (comment specifically on pertinent positives and negatives) review of specific prior hospitalizations, PCP/specialist notes, etc. discuss meds given and prescribed document any discussion with consultants (including hospitalists) any clinical decision tools you used and why (PECARN, NEXUS, etc.) did you consider admitting the patient? document social determinants of health affecting patient's care (homelessness, inability to follow up in a timely fashion, etc) document any pre-existing conditions increasing risk on current visit  (e.g. diabetes and HTN increasing danger of high-risk chest pain/ACS) describes what meds you gave (especially parenteral) and why any other interventions?:1}     FINAL CLINICAL IMPRESSION(S) / ED DIAGNOSES   Final diagnoses:  None     Rx / DC Orders   ED Discharge Orders     None        Note:  This document was prepared using Dragon voice recognition software and may include unintentional dictation errors.

## 2023-04-17 NOTE — Assessment & Plan Note (Signed)
History of melanoma Followed by oncology, last seen September 2024

## 2023-04-17 NOTE — Assessment & Plan Note (Signed)
Suspect delirium Presents with auditory and tactile hallucinations, starting 1 day postop Multifactorial related to clinical dehydration with AKI, pain, pain meds, recent surgery CT head nonacute Trial of hydration.  NS bolus x 1 Neurologic checks Delirium precautions and fall precautions

## 2023-04-17 NOTE — Assessment & Plan Note (Signed)
Primary osteoarthritis of the left knee Patient felt a pop in the knee the day prior and now has pain X-ray showing osteoarthritis, no significant effusion otherwise nonacute Follow-up synovial fluid studies PT OT eval Orthopedic consult

## 2023-04-17 NOTE — Assessment & Plan Note (Signed)
Swelling right hand No acute issues suspected Right shoulder and arm in sling.   Minimal swelling right hand Right upper extremity venous Doppler was done in the ED and was negative for DVT of right IJ and subclavian Will consult orthopedics for eval and recommendations

## 2023-04-18 DIAGNOSIS — N179 Acute kidney failure, unspecified: Secondary | ICD-10-CM | POA: Diagnosis not present

## 2023-04-18 LAB — CBC
HCT: 23 % — ABNORMAL LOW (ref 39.0–52.0)
HCT: 22.4 % — ABNORMAL LOW (ref 39.0–52.0)
HCT: 22.4 % — ABNORMAL LOW (ref 39.0–52.0)
Hemoglobin: 7 g/dL — ABNORMAL LOW (ref 13.0–17.0)
Hemoglobin: 7 g/dL — ABNORMAL LOW (ref 13.0–17.0)
Hemoglobin: 7 g/dL — ABNORMAL LOW (ref 13.0–17.0)
MCH: 29.3 pg (ref 26.0–34.0)
MCH: 29.5 pg (ref 26.0–34.0)
MCH: 29.7 pg (ref 26.0–34.0)
MCHC: 30.4 g/dL (ref 30.0–36.0)
MCHC: 31.3 g/dL (ref 30.0–36.0)
MCHC: 31.3 g/dL (ref 30.0–36.0)
MCV: 96.2 fL (ref 80.0–100.0)
MCV: 94.5 fL (ref 80.0–100.0)
MCV: 94.9 fL (ref 80.0–100.0)
Platelets: 207 10*3/uL (ref 150–400)
Platelets: 219 10*3/uL (ref 150–400)
Platelets: 227 10*3/uL (ref 150–400)
RBC: 2.39 MIL/uL — ABNORMAL LOW (ref 4.22–5.81)
RBC: 2.37 MIL/uL — ABNORMAL LOW (ref 4.22–5.81)
RBC: 2.36 MIL/uL — ABNORMAL LOW (ref 4.22–5.81)
RDW: 18.7 % — ABNORMAL HIGH (ref 11.5–15.5)
RDW: 19 % — ABNORMAL HIGH (ref 11.5–15.5)
RDW: 18.8 % — ABNORMAL HIGH (ref 11.5–15.5)
WBC: 6.2 10*3/uL (ref 4.0–10.5)
WBC: 5.7 10*3/uL (ref 4.0–10.5)
WBC: 6.6 10*3/uL (ref 4.0–10.5)
nRBC: 1 % — ABNORMAL HIGH (ref 0.0–0.2)
nRBC: 0.7 % — ABNORMAL HIGH (ref 0.0–0.2)
nRBC: 1.1 % — ABNORMAL HIGH (ref 0.0–0.2)

## 2023-04-18 LAB — SYNOVIAL CELL COUNT + DIFF, W/ CRYSTALS
Eosinophils-Synovial: 0 %
Eosinophils-Synovial: 0 %
Lymphocytes-Synovial Fld: 4 %
Lymphocytes-Synovial Fld: 0 %
Monocyte-Macrophage-Synovial Fluid: 6 %
Monocyte-Macrophage-Synovial Fluid: 6 %
Neutrophil, Synovial: 90 %
Neutrophil, Synovial: 94 %
WBC, Synovial: 39604 /mm3 — ABNORMAL HIGH (ref 0–200)
WBC, Synovial: 28580 /mm3 — ABNORMAL HIGH (ref 0–200)

## 2023-04-18 LAB — BASIC METABOLIC PANEL
Anion gap: 10 (ref 5–15)
BUN: 64 mg/dL — ABNORMAL HIGH (ref 8–23)
CO2: 18 mmol/L — ABNORMAL LOW (ref 22–32)
Calcium: 8 mg/dL — ABNORMAL LOW (ref 8.9–10.3)
Chloride: 108 mmol/L (ref 98–111)
Creatinine, Ser: 2.96 mg/dL — ABNORMAL HIGH (ref 0.61–1.24)
GFR, Estimated: 20 mL/min — ABNORMAL LOW (ref >60)
Glucose, Bld: 115 mg/dL — ABNORMAL HIGH (ref 70–99)
Potassium: 5 mmol/L (ref 3.5–5.1)
Sodium: 136 mmol/L (ref 135–145)

## 2023-04-18 LAB — URIC ACID: Uric Acid, Serum: 8.8 mg/dL — ABNORMAL HIGH (ref 3.7–8.6)

## 2023-04-18 LAB — LACTIC ACID, PLASMA
Lactic Acid, Venous: 0.6 mmol/L (ref 0.5–1.9)
Lactic Acid, Venous: 0.5 mmol/L (ref 0.5–1.9)

## 2023-04-18 LAB — ABO/RH: ABO/RH(D): O POS

## 2023-04-18 LAB — PREPARE RBC (CROSSMATCH)

## 2023-04-18 MED ORDER — AMLODIPINE BESYLATE 5 MG PO TABS
10.0000 mg | ORAL_TABLET | Freq: Every evening | ORAL | Status: DC
  Administered 2023-04-18 – 2023-04-20 (×3): 10 mg via ORAL
  Filled 2023-04-18 (×3): qty 2

## 2023-04-18 MED ORDER — DULOXETINE HCL 20 MG PO CPEP
20.0000 mg | ORAL_CAPSULE | Freq: Every day | ORAL | Status: DC
  Administered 2023-04-18 – 2023-04-20 (×3): 20 mg via ORAL
  Filled 2023-04-18 (×3): qty 1

## 2023-04-18 MED ORDER — ONDANSETRON HCL 4 MG/2ML IJ SOLN: 4.0000 mg | Freq: Four times a day (QID) | INTRAMUSCULAR | Status: DC | PRN

## 2023-04-18 MED ORDER — SODIUM CHLORIDE 0.9 % IV BOLUS
1000.0000 mL | Freq: Once | INTRAVENOUS | Status: AC
  Administered 2023-04-18: 1000 mL via INTRAVENOUS

## 2023-04-18 MED ORDER — TAMSULOSIN HCL 0.4 MG PO CAPS
0.8000 mg | ORAL_CAPSULE | Freq: Every evening | ORAL | Status: DC
  Administered 2023-04-18 – 2023-04-20 (×3): 0.8 mg via ORAL
  Filled 2023-04-18 (×3): qty 2

## 2023-04-18 MED ORDER — BUPIVACAINE HCL (PF) 0.5 % IJ SOLN
10.0000 mL | Freq: Once | INTRAMUSCULAR | Status: AC
  Administered 2023-04-18: 10 mL via INTRA_ARTICULAR
  Filled 2023-04-18 (×2): qty 10

## 2023-04-18 MED ORDER — ENSURE ENLIVE PO LIQD
237.0000 mL | Freq: Three times a day (TID) | ORAL | Status: DC
  Administered 2023-04-18 – 2023-04-21 (×7): 237 mL via ORAL

## 2023-04-18 MED ORDER — CLOPIDOGREL BISULFATE 75 MG PO TABS
75.0000 mg | ORAL_TABLET | Freq: Every day | ORAL | Status: DC
  Administered 2023-04-18 – 2023-04-21 (×4): 75 mg via ORAL
  Filled 2023-04-18 (×4): qty 1

## 2023-04-18 MED ORDER — HYDROCODONE-ACETAMINOPHEN 5-325 MG PO TABS: 1.0000 | ORAL_TABLET | ORAL | Status: DC | PRN

## 2023-04-18 MED ORDER — ENOXAPARIN SODIUM 30 MG/0.3ML IJ SOSY
30.0000 mg | PREFILLED_SYRINGE | INTRAMUSCULAR | Status: DC
  Administered 2023-04-18 – 2023-04-21 (×4): 30 mg via SUBCUTANEOUS
  Filled 2023-04-18 (×4): qty 0.3

## 2023-04-18 MED ORDER — ACETAMINOPHEN 650 MG RE SUPP
650.0000 mg | Freq: Four times a day (QID) | RECTAL | Status: DC | PRN
Start: 2023-04-18 — End: 2023-04-21

## 2023-04-18 MED ORDER — ACETAMINOPHEN 325 MG PO TABS
650.0000 mg | ORAL_TABLET | Freq: Four times a day (QID) | ORAL | Status: DC | PRN
  Administered 2023-04-18: 650 mg via ORAL
  Filled 2023-04-18: qty 2

## 2023-04-18 MED ORDER — ONDANSETRON HCL 4 MG PO TABS: 4.0000 mg | ORAL_TABLET | Freq: Four times a day (QID) | ORAL | Status: DC | PRN

## 2023-04-18 MED ORDER — SIMVASTATIN 10 MG PO TABS
40.0000 mg | ORAL_TABLET | Freq: Every day | ORAL | Status: DC
  Administered 2023-04-18 – 2023-04-21 (×4): 40 mg via ORAL
  Filled 2023-04-18 (×4): qty 4

## 2023-04-18 MED ORDER — PANTOPRAZOLE SODIUM 40 MG PO TBEC
40.0000 mg | DELAYED_RELEASE_TABLET | Freq: Every day | ORAL | Status: DC
  Administered 2023-04-18 – 2023-04-21 (×4): 40 mg via ORAL
  Filled 2023-04-18 (×4): qty 1

## 2023-04-18 MED ORDER — TRIAMCINOLONE ACETONIDE 40 MG/ML IJ SUSP
80.0000 mg | Freq: Once | INTRAMUSCULAR | Status: AC
  Administered 2023-04-18: 80 mg via INTRA_ARTICULAR
  Filled 2023-04-18 (×2): qty 2

## 2023-04-18 MED ORDER — LEVOTHYROXINE SODIUM 25 MCG PO TABS
25.0000 ug | ORAL_TABLET | Freq: Every day | ORAL | Status: DC
  Administered 2023-04-18 – 2023-04-21 (×4): 25 ug via ORAL
  Filled 2023-04-18 (×4): qty 1

## 2023-04-18 MED ORDER — SODIUM CHLORIDE 0.9% IV SOLUTION: Freq: Once | INTRAVENOUS | Status: AC

## 2023-04-18 MED ORDER — LOSARTAN POTASSIUM 50 MG PO TABS
100.0000 mg | ORAL_TABLET | Freq: Every morning | ORAL | Status: DC
  Administered 2023-04-18 – 2023-04-19 (×2): 100 mg via ORAL
  Filled 2023-04-18 (×2): qty 2

## 2023-04-18 MED ORDER — PREDNISONE 20 MG PO TABS
40.0000 mg | ORAL_TABLET | Freq: Every day | ORAL | Status: DC
  Administered 2023-04-18 – 2023-04-21 (×4): 40 mg via ORAL
  Filled 2023-04-18 (×4): qty 2

## 2023-04-18 MED ORDER — ADULT MULTIVITAMIN W/MINERALS CH
1.0000 | ORAL_TABLET | Freq: Every day | ORAL | Status: DC
Start: 2023-04-19 — End: 2023-04-21
  Administered 2023-04-19 – 2023-04-21 (×3): 1 via ORAL
  Filled 2023-04-18 (×3): qty 1

## 2023-04-18 NOTE — Progress Notes (Signed)
MD Djan made aware  that this pt has a fever this AM of 101.4 and that his O2 sats are 87% on RA. Pt is currently back to 95% on 2L of oxygen.

## 2023-04-18 NOTE — Consult Note (Signed)
ORTHOPAEDIC CONSULTATION  REQUESTING PHYSICIAN: Loyce Dys, MD  Chief Complaint:   Left knee pain and effusion.  History of Present Illness: [name] is a 82 y.o. male with multiple medical problems including gastroesophageal reflux disease, hyperlipidemia, hypertension, hypothyroidism, and chronic renal insufficiency who is now 5 days status post a reverse right total shoulder arthroplasty for rotator cuff arthropathy.  According to his family, the patient seemed to be recuperating reasonably well from his recent right shoulder surgery, but was apparently beginning to experience some mental status changes over the past several days.  The patient apparently felt a pop in his knee while trying to get up from his chair.  He noted excruciating pain and had difficulty bearing weight, so he was brought to the emergency room.  X-rays in the emergency room did not show any evidence for fracture of the left knee but did demonstrate some effusion.  This effusion was tapped and demonstrated 39,600 white cells with calcium pyrophosphate crystals, but no evidence of bacteria.  Subsequently, the patient was admitted for medical optimization and management of his left knee pain.  The patient denies any fevers or chills while at home over the past 5 days, although he did run a temperature to 101.4 this morning.  He notes some swelling in his right hand, but otherwise denies any complaints pertaining to his right shoulder or upper extremity.  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   right forearm, [facility]   Stroke (HCC) 11/2007   R brain subcortical infarct   Past Surgical History:  Procedure Laterality Date   ASPIRATION / INJECTION RENAL CYST  07/08/2015   BACK SURGERY     approx 20- 25 years ago   COLONOSCOPY     EYE SURGERY     cataract both eyes   GAS INSERTION  08/11/2011   Procedure: INSERTION OF GAS;  Surgeon: Sherrie George, MD;  Location: [facility] OR;  Service: Ophthalmology;  Laterality: Right;  [id number]   REVERSE SHOULDER ARTHROPLASTY Right 04/13/2023   Procedure: REVERSE SHOULDER ARTHROPLASTY;  Surgeon: Christena Flake, MD;  Location: [facility] ORS;  Service: Orthopedics;  Laterality: Right;   SCLERAL BUCKLE  08/11/2011   Procedure: SCLERAL BUCKLE;  Surgeon: Sherrie George, MD;  Location: [facility] OR;  Service: Ophthalmology;  Laterality: Right;   VARICOSE VEIN SURGERY     Social History   Socioeconomic History   Marital status: Married    Spouse name: [name]    Number of children: 2   Years of education: 12+   Highest education level: Some college, no degree  Occupational History   Occupation: Fish farm manager: OTHER    Comment: community   Occupation: SELF EMPLOYED    Employer: SELF EMPLOYED  Tobacco Use   Smoking status: Former    Current packs/day: 0.00    Types: Cigarettes  Quit date: 06/06/2010    Years since quitting: 12.8   Smokeless tobacco: Never  Vaping Use   Vaping status: Never Used  Substance and Sexual Activity   Alcohol use: No    Alcohol/week: 0.0 standard drinks of alcohol   Drug use: No   Sexual activity: Not Currently  Other Topics Concern   Not on file  Social History Narrative   Pt lives at home with his family.   Caffeine Use- 2 cups daily   Patient has 2 children.    Patient has some college.    Patient is right handed.          Works full time   Engineer, building services Strain: Low Risk  (07/12/2021)   Overall Financial Resource Strain (CARDIA)    Difficulty of Paying Living Expenses: Not hard at all  Food Insecurity: No Food Insecurity (07/12/2021)   Hunger Vital Sign    Worried About Running Out of Food in the Last Year: Never true    Ran Out of Food in the Last Year: Never true  Transportation Needs: No Transportation Needs (07/12/2021)   PRAPARE - Administrator, Civil Service (Medical): No    Lack of Transportation (Non-Medical): No  Physical Activity: Inactive (07/12/2021)   Exercise Vital Sign    Days of Exercise per Week: 0 days    Minutes of Exercise per Session: 0 min  Stress: No Stress Concern Present (07/12/2021)   [employer] of Occupational Health - Occupational Stress Questionnaire    Feeling of Stress : Not at all  Social Connections: Socially Integrated (07/12/2021)   Social Connection and Isolation Panel [NHANES]    Frequency of Communication with Friends and Family: Three times a week    Frequency of Social Gatherings with Friends and Family: More than three times a week    Attends Religious Services: More than 4 times per year    Active Member of Clubs or Organizations: Yes    Attends Engineer, structural: More than 4 times per year    Marital Status: Married   Family History  Problem Relation Age of Onset   Heart disease Father    Diabetes Son    Kidney disease Neg Hx    Prostate cancer Neg Hx    Allergies  Allergen Reactions   Meloxicam Nausea And Vomiting   Prior to Admission medications   Medication Sig Start Date End Date Taking? Authorizing Provider  acetaminophen (TYLENOL) 500 MG tablet Take 1,000 mg by mouth every 6 (six) hours as needed for mild pain (pain score 1-3).    [provider]  amLODipine (NORVASC) 10 MG tablet Take 1 tablet (10 mg total) by mouth every evening. 09/13/22   Johnson, Megan P, DO  azelastine (ASTELIN) 0.1 % nasal spray Place 1 spray  into both nostrils 2 (two) times daily. Use in each nostril as directed 03/28/19   Particia Nearing, PA-C  Calcium Citrate-Vitamin D (CITRACAL + D PO) Take 1 tablet by mouth in the morning.    [provider]  Cholecalciferol (D3-1000 PO) Take 1,000 Units by mouth in the morning.    [provider]  clopidogrel (PLAVIX) 75 MG tablet TAKE ONE TABLET BY MOUTH EVERY DAY 02/17/23   Johnson, Megan P, DO  Cyanocobalamin (VITAMIN B-12 PO) Take 1 tablet by mouth in the morning.    [provider]  dapagliflozin propanediol (FARXIGA) 10 MG TABS tablet Take 10 mg  by mouth. 04/05/23 04/04/24  [provider]  DULoxetine (CYMBALTA) 20 MG capsule Take 1 capsule (20 mg total) by mouth at bedtime. TAKE 1 CAPSULE BY MOUTH EVERY DAY 04/13/23   Darreld Hoffer, Excell Seltzer, MD  gabapentin (NEURONTIN) 300 MG capsule Take 600 mg by mouth 3 (three) times daily.    [provider]  ketoconazole (NIZORAL) 2 % cream Apply twice daily to affected area on buttocks until clear 03/29/23   Deirdre Evener, MD  levothyroxine (SYNTHROID) 25 MCG tablet TAKE 1 TABLET EVERY DAY ON EMPTY STOMACHWITH A GLASS OF WATER AT LEAST 30-60 MINBEFORE BREAKFAST 01/04/23   Johnson, Megan P, DO  lidocaine (LIDODERM) 5 % Place 1 patch onto the skin daily. Remove & Discard patch within 12 hours or as directed by MD Patient taking differently: Place 1 patch onto the skin daily as needed. Remove & Discard patch within 12 hours or as directed by MD 03/20/23   Olevia Perches P, DO  losartan (COZAAR) 100 MG tablet Take 1 tablet (100 mg total) by mouth every morning. 09/13/22   Johnson, Megan P, DO  meclizine (ANTIVERT) 25 MG tablet TAKE 1 TABLET BY MOUTH THREE TIMES DAILYAS NEEDED FOR DIZZINESS 02/10/23   Cannady, Corrie Dandy T, NP  Multiple Vitamin (MULITIVITAMIN WITH MINERALS) TABS Take 1 tablet by mouth in the morning.    [provider]  Naproxen Sodium 220 MG CAPS Take 440 mg by mouth daily as needed (pain).     [provider]  omeprazole (PRILOSEC) 40 MG capsule Take 1 capsule (40 mg total) by mouth at bedtime. 04/13/23   Felesha Moncrieffe, Excell Seltzer, MD  oxyCODONE (ROXICODONE) 5 MG immediate release tablet Take 1-2 tablets (5-10 mg total) by mouth every 4 (four) hours as needed for moderate pain (pain score 4-6) or severe pain (pain score 7-10). 04/13/23   Jameison Haji, Excell Seltzer, MD  simvastatin (ZOCOR) 40 MG tablet Take 1 tablet (40 mg total) by mouth daily. 09/13/22   Johnson, Megan P, DO  tadalafil (CIALIS) 5 MG tablet Take 1 tablet (5 mg total) by mouth daily. Patient taking differently: Take 5 mg by mouth at bedtime. 09/13/22   Johnson, Megan P, DO  tamsulosin (FLOMAX) 0.4 MG CAPS capsule Take 2 capsules (0.8 mg total) by mouth every evening. 04/13/23   Luva Metzger, Excell Seltzer, MD  terbinafine (LAMISIL) 250 MG tablet Take 1 tablet (250 mg total) by mouth every evening. 04/13/23   Renatta Shrieves, Excell Seltzer, MD  zinc gluconate 50 MG tablet Take 50 mg by mouth in the morning.    [provider]   US Venous Img Upper Right (DVT Study)  Result Date: 04/17/2023 CLINICAL DATA:  Hand swelling and pain. EXAM: RIGHT UPPER EXTREMITY VENOUS DOPPLER ULTRASOUND TECHNIQUE: Gray-scale sonography with graded compression, as well as color Doppler and duplex ultrasound were performed to evaluate the upper extremity deep venous system from the level of the subclavian vein and including the jugular, axillary, basilic, radial, ulnar and upper cephalic vein. Spectral Doppler was utilized to evaluate flow at rest and with distal augmentation maneuvers. COMPARISON:  None Available. FINDINGS: Contralateral Subclavian Vein: Respiratory phasicity is normal and symmetric with the symptomatic side. No evidence of thrombus. Normal compressibility. Internal Jugular Vein: No evidence of thrombus. Normal compressibility, respiratory phasicity and response to augmentation. Subclavian Vein: No evidence of thrombus. Normal compressibility, respiratory phasicity and response  to augmentation. Axillary Vein: Unable to evaluate, as per the ultrasound technologist, secondary to recent surgery, limited patient positioning and patient altered  mental status. Cephalic Vein: Unable to evaluate, as per the ultrasound technologist, secondary to recent surgery, limited patient positioning and patient altered mental status. Basilic Vein: Unable to evaluate, as per the ultrasound technologist, secondary to recent surgery, limited patient positioning and patient altered mental status. Brachial Veins: Unable to evaluate, as per the ultrasound technologist, secondary to recent surgery, limited patient positioning and patient altered mental status. Radial Veins: Unable to evaluate, as per the ultrasound technologist, secondary to recent surgery, limited patient positioning and patient altered mental status. Ulnar Veins: Unable to evaluate, as per the ultrasound technologist, secondary to recent surgery, limited patient positioning and patient altered mental status. Venous Reflux:  None visualized. Other Findings:  None visualized. IMPRESSION: Markedly limited study, as described above, without evidence of DVT within the RIGHT internal jugular vein and RIGHT subclavian vein. Electronically Signed   By: Aram Candela M.D.   On: 04/17/2023 21:42   CT Head Wo Contrast  Result Date: 04/17/2023 CLINICAL DATA:  Mental status change, unknown cause Patient reports hearing a "pop" when sitting down in chair yesterday. Also had a reverse right shoulder replacement on 11/7- arm in sling with cooling system. EXAM: CT HEAD WITHOUT CONTRAST TECHNIQUE: Contiguous axial images were obtained from the base of the skull through the vertex without intravenous contrast. RADIATION DOSE REDUCTION: This exam was performed according to the departmental dose-optimization program which includes automated exposure control, adjustment of the mA and/or kV according to patient size and/or use of iterative reconstruction  technique. COMPARISON:  MRI head 04/12/2022 FINDINGS: Brain: Patchy and confluent areas of decreased attenuation are noted throughout the deep and periventricular white matter of the cerebral hemispheres bilaterally, compatible with chronic microvascular ischemic disease. No evidence of large-territorial acute infarction. No parenchymal hemorrhage. No mass lesion. No extra-axial collection. No mass effect or midline shift. No hydrocephalus. Basilar cisterns are patent. Vascular: No hyperdense vessel. Atherosclerotic calcifications are present within the cavernous internal carotid arteries. Skull: No acute fracture or focal lesion. Sinuses/Orbits: Bilateral maxillary sinus mucosal thickening. Otherwise paranasal sinuses and mastoid air cells are clear. Right scleral buckle. Bilateral lens replacement. Otherwise the orbits are unremarkable. Other: None. IMPRESSION: No acute intracranial abnormality. Electronically Signed   By: Tish Frederickson M.D.   On: 04/17/2023 21:40   DG Knee Complete 4 Views Left  Result Date: 04/17/2023 CLINICAL DATA:  Knee pain after injury.  Heard a pop. EXAM: LEFT KNEE - COMPLETE 4+ VIEW COMPARISON:  None Available. FINDINGS: No evidence of fracture, dislocation, or significant joint effusion. Moderate osteoarthritis with tricompartmental peripheral spurring. Lateral tibiofemoral joint space narrowing. 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 Peripheral vascular calcifications. IMPRESSION: 1. No fracture or subluxation of the left knee. 2. Moderate osteoarthritis. 3. Chondrocalcinosis. Electronically Signed   By: Narda Rutherford M.D.   On: 04/17/2023 17:45    Positive ROS: All other systems have been reviewed and were otherwise negative with the exception of those mentioned in the HPI and as above.  Physical Exam: General:  Alert, no acute distress Psychiatric:  Patient is competent for consent with normal mood and affect   Cardiovascular:  No pedal edema Respiratory:  No wheezing,  non-labored breathing GI:  Abdomen is soft and non-tender Skin:  No lesions in the area of chief complaint Neurologic:  Sensation intact distally Lymphatic:  No axillary or cervical lymphadenopathy  Orthopedic Exam:  Orthopedic examination of the right shoulder and upper extremity is notable for an anterior shoulder surgical incision which appears to be healing well.  There is mild  swelling around the shoulder, but no erythema, ecchymosis, abrasions, or other skin abnormalities are identified.  He does have some swelling extending down his upper arm to his hand, which is normal for so soon after surgery.  He is grossly neurovascularly intact to the right upper extremity and hand.  Orthopedic examination also was focused on the left knee.  He keeps his knee in approximately 25 to 30 degrees of flexion and has pain with any attempted active or passive motion of the knee.  There is a 1-2+ effusion.  There is no overlying erythema, but there is mild swelling.  No abrasions or other skin abnormalities are identified.  He is grossly neurovascularly intact to the left foot.  X-rays:  Recent 4 view x-rays of the left knee are available for review and have been reviewed by myself.  The findings are as described above.  By my review, these films demonstrate moderate to severe degenerative joint disease, primarily involving the lateral compartment with lesser degenerative changes of the medial and patellofemoral compartments.  There also is evidence of chondrocalcinosis of both menisci.  No fractures, lytic lesions, or acute bony abnormalities are identified.  Assessment: 1.  Status post reverse right total shoulder arthroplasty. 2.  Acute left knee effusion with underlying degenerative joint disease and chondrocalcinosis.  Plan: The treatment options have been discussed with the patient and his family, who is at the bedside.  Regarding the right shoulder, I do not feel that there is any concern for  infection at this time.  He is to continue to wear his shoulder immobilizer and Polar Care device at all to, removing these devices only for hygienic purposes.  He is encouraged to keep his hand elevated is much as possible above heart level and to squeeze his fingers and hand is much as possible to help promote reduction in his forearm and hand swelling.  Regarding his left knee symptoms, the patient is frustrated by the amount of pain he is experiencing.  The knee aspiration from last night was most consistent with pseudogout.  No organisms were observed on the Gram stain.    Therefore the, the patient is offered and accepts a steroid injection into the left knee.  This injection is performed sterilely using 2 cc of Kenalog 40 and 8 cc of 0.5% plain Sensorcaine.  Prior to injecting the Kenalog/Sensorcaine solution, a total of 22 cc of turbid strawberry colored fluid was aspirated from the knee and sent for cell count and differential, crystals, Gram stain, and a repeat culture and sensitivity, given his fever this morning.  The patient tolerated the procedure well.  The patient may be mobilized with physical therapy, weightbearing as tolerated on the left leg, as symptoms permit.  He is not able to weight-bear through his right upper extremity and therefore would require a hemiwalker.  Thank you for asking me to participate in the care of this most pleasant yet unfortunate man.  I will be happy to follow him with you.   Maryagnes Amos, MD  Beeper #:  [phone]  04/18/2023 1:43 PM

## 2023-04-18 NOTE — Progress Notes (Signed)
Progress Note   Patient: [name] ZOX:[id number] DOB: [birth year] DOA: 04/17/2023     1 DOS: the patient was seen and examined on 04/18/2023    Subjective:  Patient seen and examined at bedside this morning in the presence of the wife as well as his son Denies nausea vomiting abdominal pain chest pain or cough He admits to some improvement involving the left knee pain   Brief hospital course:  HEBRON SABATH is a 82 y.o. male with medical history significant for CVA, primary myelofibrosis, BPH, CKD stage IIIb, who presents to the ED primarily for left knee pain and swelling after he twisted his knee when attempting to transfer from a chair(felt up pop).  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  Patient is status post left knee arthrocentesis.  Orthopedics following.      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 Patient felt a pop in the knee the day prior and now has pain X-ray showing osteoarthritis, no significant effusion otherwise nonacute Follow-up synovial fluid studies Synovial fluid showing calcium pyrophosphate crystals with WBC of 39,604 Serum uric acid of 8.8 Initiate prednisone therapy PT OT on board Orthopedic consulted, recommendation pending    Hallucinations Suspect delirium Presents with auditory and tactile hallucinations, starting 1 day postop Multifactorial related to clinical dehydration with AKI, pain, pain meds, recent surgery CT head nonacute Received trial of IV fluids in the ED Monitor neurochecks Continue delirium precautions and fall precautions  Acute renal failure superimposed on stage 3b chronic kidney disease (HCC)-improved Hyperkalemia-improved Creatinine 3.24, up from baseline of 1.91 a couple months prior with bicarb 20 and potassium 5.2, which is  improving Continue IV fluid therapy as well as Encourage oral rehydration Monitor renal function and avoid nephrotoxins If renal function were to worsen we will consider nephrology consultation    Acute on chronic anemia hemoglobin 7.0, down from 9.4 a couple months prior, Receives erythropoietin by oncology Patient wife has agreed for 1 unit of blood transfusion today Continue to monitor serial H&H and transfuse to keep hemoglobin greater than 7.   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   Minimal swelling right hand orthopedics on board.  Appreciate input     Primary myelofibrosis (HCC) History of melanoma Followed by oncology, last seen September 2024   BPH (benign prostatic hyperplasia) Continue tamsulosin   Late effects of cerebrovascular disease Continue home meds   DVT prophylaxis: Lovenox   Consults: Orthopedics, Signa Kell, MD   Advance Care Planning:   Code Status: Prior . Discussed with wife at bedside   Family Communication: wife and grandson at bedside   Disposition Plan: Back to previous home environment    Physical Exam: Vitals and nursing note reviewed.  Constitutional:      General: He is awake. He is not in acute distress.    Comments: Elderly male, appears somewhat dehydrated, sunken eyes  HENT:     Head: Normocephalic and atraumatic.     Mouth/Throat:     Mouth: Mucous membranes are dry.  Cardiovascular:     Rate and Rhythm: Normal rate and regular rhythm.     Heart sounds: Normal heart sounds.  Pulmonary:     Effort: Pulmonary effort is normal.  Breath sounds: Normal breath sounds.  Abdominal:     Palpations: Abdomen is soft.     Tenderness: There is no abdominal tenderness.  Musculoskeletal:     Comments: Right shoulder and arm in hard sling Minimally appreciable swelling in the right hand. Left knee without appreciable swelling or  overlying erythema.        Vitals:   04/18/23 0043 04/18/23 0115 04/18/23 0719 04/18/23 0736  BP: 135/76 (!) 142/61 (!) 139/53   Pulse: 98 90 99 98  Resp:   18   Temp:  98.7 F (37.1 C) (!) 101.4 F (38.6 C)   TempSrc:  Oral Oral   SpO2: 91% 94% (!) 87% 95%  Weight:      Height:        Data Reviewed: I have reviewed patient's synovial fluid results Doppler of the upper extremity did not show any findings of DVT CT scan of the head did not show acute intracranial pathology I have reviewed the above-mentioned labs    Latest Ref Rng & Units 04/18/2023    9:02 AM 04/17/2023    8:17 PM 04/10/2023    9:58 AM  BMP  Glucose 70 - 99 mg/dL 161  096  045   BUN 8 - 23 mg/dL 64  69  47   Creatinine 0.61 - 1.24 mg/dL 4.09  8.11  9.14   Sodium 135 - 145 mmol/L 136  136  141   Potassium 3.5 - 5.1 mmol/L 5.0  5.2  4.9   Chloride 98 - 111 mmol/L 108  108  113   CO2 22 - 32 mmol/L 18  20  22    Calcium 8.9 - 10.3 mg/dL 8.0  7.9  8.1        Latest Ref Rng & Units 04/18/2023    9:02 AM 04/18/2023    4:21 AM 04/18/2023    1:38 AM  CBC  WBC 4.0 - 10.5 K/uL 6.6  5.7  6.2   Hemoglobin 13.0 - 17.0 g/dL 7.0  7.0  7.0   Hematocrit 39.0 - 52.0 % 22.4  22.4  23.0   Platelets 150 - 400 K/uL 227  219  207     Time spent: 58 minutes  Author: Loyce Dys, MD 04/18/2023 11:54 AM  For on call review [email or web address].

## 2023-04-18 NOTE — ED Notes (Signed)
Pt moved to cpod.  Report off to [employer]

## 2023-04-18 NOTE — Plan of Care (Signed)
Alert, oriented x2. Family at bedside. Bedside aspiration and injection performed by Dr. Joice Lofts.   Problem: Education: Goal: Knowledge of General Education information will improve Description: Including pain rating scale, medication(s)/side effects and non-pharmacologic comfort measures Outcome: Progressing   Problem: Health Behavior/Discharge Planning: Goal: Ability to manage health-related needs will improve Outcome: Progressing   Problem: Clinical Measurements: Goal: Ability to maintain clinical measurements within normal limits will improve Outcome: Progressing Goal: Will remain free from infection Outcome: Progressing Goal: Diagnostic test results will improve Outcome: Progressing Goal: Respiratory complications will improve Outcome: Progressing

## 2023-04-18 NOTE — Progress Notes (Signed)
PHARMACIST - PHYSICIAN COMMUNICATION  CONCERNING:  Enoxaparin (Lovenox) for DVT Prophylaxis    RECOMMENDATION: Patient was prescribed enoxaprin 40mg  q24 hours for VTE prophylaxis.   Filed Weights   04/17/23 1514  Weight: 82 kg (180 lb 12.4 oz)    Body mass index is 25.94 kg/m.  Estimated Creatinine Clearance: 18.1 mL/min (A) (by C-G formula based on SCr of 3.24 mg/dL (H)).  Patient is candidate for enoxaparin 30mg  every 24 hours based on CrCl <59ml/min or Weight <45kg  DESCRIPTION: Pharmacy has adjusted enoxaparin dose per [facility] policy.  Patient is now receiving enoxaparin 30 mg every 24 hours   Otelia Sergeant, PharmD, [facility] 04/18/2023 12:24 AM

## 2023-04-18 NOTE — Progress Notes (Signed)
PT Cancellation Note  Patient Details Name: [name] MRN: [id number] DOB: 1941-01-13   Cancelled Treatment:    Reason Eval/Treat Not Completed: Other (comment). Pt is received sleeping with family at bedside, after several attempts of awakening Pt and sternal rub he said "I want to sleep". Will attempt PT eval at a later time.   Linkin Vizzini 04/18/2023, 2:32 PM

## 2023-04-19 ENCOUNTER — Inpatient Hospital Stay: Payer: Medicare Other

## 2023-04-19 DIAGNOSIS — N1832 Chronic kidney disease, stage 3b: Secondary | ICD-10-CM

## 2023-04-19 DIAGNOSIS — N179 Acute kidney failure, unspecified: Secondary | ICD-10-CM | POA: Diagnosis not present

## 2023-04-19 DIAGNOSIS — M25562 Pain in left knee: Secondary | ICD-10-CM

## 2023-04-19 DIAGNOSIS — R410 Disorientation, unspecified: Secondary | ICD-10-CM

## 2023-04-19 DIAGNOSIS — R351 Nocturia: Secondary | ICD-10-CM

## 2023-04-19 DIAGNOSIS — Z96611 Presence of right artificial shoulder joint: Secondary | ICD-10-CM

## 2023-04-19 DIAGNOSIS — D649 Anemia, unspecified: Secondary | ICD-10-CM

## 2023-04-19 DIAGNOSIS — E875 Hyperkalemia: Secondary | ICD-10-CM | POA: Diagnosis not present

## 2023-04-19 DIAGNOSIS — N401 Enlarged prostate with lower urinary tract symptoms: Secondary | ICD-10-CM

## 2023-04-19 LAB — TYPE AND SCREEN
ABO/RH(D): O POS
Antibody Screen: NEGATIVE
Unit division: 0

## 2023-04-19 LAB — GLUCOSE, BODY FLUID OTHER: Glucose, Body Fluid Other: 15 mg/dL

## 2023-04-19 LAB — URIC ACID, BODY FLUID: Uric Acid Body Fluid: 7.8 mg/dL

## 2023-04-19 LAB — CBC WITH DIFFERENTIAL/PLATELET
Abs Immature Granulocytes: 0.79 10*3/uL — ABNORMAL HIGH (ref 0.00–0.07)
Basophils Absolute: 0.1 10*3/uL (ref 0.0–0.1)
Basophils Relative: 1 %
Eosinophils Absolute: 0 10*3/uL (ref 0.0–0.5)
Eosinophils Relative: 0 %
HCT: 24 % — ABNORMAL LOW (ref 39.0–52.0)
Hemoglobin: 7.5 g/dL — ABNORMAL LOW (ref 13.0–17.0)
Immature Granulocytes: 12 %
Lymphocytes Relative: 5 %
Lymphs Abs: 0.3 10*3/uL — ABNORMAL LOW (ref 0.7–4.0)
MCH: 29.4 pg (ref 26.0–34.0)
MCHC: 31.3 g/dL (ref 30.0–36.0)
MCV: 94.1 fL (ref 80.0–100.0)
Monocytes Absolute: 0.7 10*3/uL (ref 0.1–1.0)
Monocytes Relative: 11 %
Neutro Abs: 5 10*3/uL (ref 1.7–7.7)
Neutrophils Relative %: 71 %
Platelets: 215 10*3/uL (ref 150–400)
RBC: 2.55 MIL/uL — ABNORMAL LOW (ref 4.22–5.81)
RDW: 18.4 % — ABNORMAL HIGH (ref 11.5–15.5)
Smear Review: NORMAL
WBC: 6.9 10*3/uL (ref 4.0–10.5)
nRBC: 0.4 % — ABNORMAL HIGH (ref 0.0–0.2)

## 2023-04-19 LAB — BASIC METABOLIC PANEL
Anion gap: 11 (ref 5–15)
BUN: 70 mg/dL — ABNORMAL HIGH (ref 8–23)
CO2: 18 mmol/L — ABNORMAL LOW (ref 22–32)
Calcium: 7.8 mg/dL — ABNORMAL LOW (ref 8.9–10.3)
Chloride: 109 mmol/L (ref 98–111)
Creatinine, Ser: 2.94 mg/dL — ABNORMAL HIGH (ref 0.61–1.24)
GFR, Estimated: 21 mL/min — ABNORMAL LOW (ref >60)
Glucose, Bld: 208 mg/dL — ABNORMAL HIGH (ref 70–99)
Potassium: 6.1 mmol/L — ABNORMAL HIGH (ref 3.5–5.1)
Sodium: 138 mmol/L (ref 135–145)

## 2023-04-19 LAB — BPAM RBC
Blood Product Expiration Date: 202412122359
ISSUE DATE / TIME: 202411122305
Unit Type and Rh: 5100

## 2023-04-19 LAB — PROTEIN, BODY FLUID (OTHER): Total Protein, Body Fluid Other: 3.5 g/dL

## 2023-04-19 MED ORDER — FE FUM-VIT C-VIT B12-FA 460-60-0.01-1 MG PO CAPS
1.0000 | ORAL_CAPSULE | Freq: Two times a day (BID) | ORAL | Status: DC
  Administered 2023-04-19 – 2023-04-21 (×5): 1 via ORAL
  Filled 2023-04-19 (×5): qty 1

## 2023-04-19 MED ORDER — PATIROMER SORBITEX CALCIUM 8.4 G PO PACK
16.8000 g | PACK | Freq: Every day | ORAL | Status: DC
  Administered 2023-04-19 – 2023-04-21 (×3): 16.8 g via ORAL
  Filled 2023-04-19 (×3): qty 2

## 2023-04-19 MED ORDER — LACTATED RINGERS IV SOLN: INTRAVENOUS | Status: AC

## 2023-04-19 NOTE — Progress Notes (Signed)
Progress Note   Patient: [name]:[id number] DOB: [birth year] DOA: 04/17/2023     2 DOS: the patient was seen and examined on 04/19/2023    Brief hospital course:  [name] is a 82 y.o. male with medical history significant for CVA, primary myelofibrosis, BPH, CKD stage IIIb, who presents to the ED primarily for left knee pain and swelling after he twisted his knee when attempting to transfer from a chair(felt up pop).  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  Patient is status post left knee arthrocentesis.  Orthopedics following.    11/13: Hemodynamically stable, potassium at 6.1, creatinine 2.94, baseline appears to be around 2.3.  Ordered Veltassa and giving some more IV fluid.  Subjective: Patient was seen and examined today.  No pain at the time of exam.   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  Synovial fluid with calcium pyruvate crystals and  WBC of 39,604 Serum uric acid of 8.8 Initiate prednisone therapy PT OT on board Orthopedic consulted, steroid knee injection was also done    Hallucinations Suspect delirium Presents with auditory and tactile hallucinations, starting 1 day postop Multifactorial related to clinical dehydration with AKI, pain, pain meds, recent surgery CT head nonacute Monitor neurochecks Continue delirium precautions and fall precautions  Acute renal failure superimposed on stage 3b chronic kidney disease (HCC)-improved Hyperkalemia- Creatinine 3.24, up from baseline of 1.91 to 2.3 a couple months prior .  Potassium at 6.1 today Continue IV fluid therapy as well as Encourage oral rehydration Giving some  Veltassa -Monitor renal function   Acute on chronic anemia hemoglobin 7.0>>7.5, down from 9.4 a couple months prior, Receives erythropoietin by oncology S/p 1 unit of PRBC Continue to monitor serial H&H and transfuse to keep hemoglobin greater than 7.   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   Minimal swelling right hand orthopedics on board.  Appreciate input     Primary myelofibrosis (HCC) History of melanoma Followed by oncology, last seen September 2024   BPH (benign prostatic hyperplasia) Continue tamsulosin   Late effects of cerebrovascular disease Continue home meds   DVT prophylaxis: Lovenox   Consults: Orthopedics, Signa Kell, MD   Advance Care Planning:   Code Status: Prior . Discussed with wife at bedside   Family Communication: wife and other family member at bedside   Disposition Plan: Back to previous home environment    Physical Exam:   Vitals:   04/19/23 0154 04/19/23 0428 04/19/23 0756 04/19/23 1533  BP: 138/68 128/66 131/73 132/60  Pulse: 86 78 83 80  Resp: 17 18 20 20   Temp: 98.4 F (36.9 C) 97.9 F (36.6 C) 98.5 F (36.9 C) 97.9 F (36.6 C)  TempSrc: Oral Axillary Oral Oral  SpO2: 97% 99% 97% 100%  Weight:      Height:       General.  Frail elderly man, in no acute distress. Pulmonary.  Lungs clear bilaterally, normal respiratory effort. CV.  Regular rate and rhythm, no JVD, rub or murmur. Abdomen.  Soft, nontender, nondistended, BS positive. CNS.  Alert and oriented .  No focal neurologic  deficit. Extremities.  No edema, no cyanosis, pulses intact and symmetrical.  Right shoulder in sling Psychiatry.  Judgment and insight appears normal.   Data Reviewed: Prior data reviewed      Latest Ref Rng & Units 04/19/2023    4:02 AM 04/18/2023    9:02 AM 04/17/2023    8:17 PM  BMP  Glucose 70 - 99 mg/dL 086  578  469   BUN 8 - 23 mg/dL 70   64  69   Creatinine 0.61 - 1.24 mg/dL 6.29  5.28  4.13   Sodium 135 - 145 mmol/L 138  136  136   Potassium 3.5 - 5.1 mmol/L 6.1  5.0  5.2   Chloride 98 - 111 mmol/L 109  108  108   CO2 22 - 32 mmol/L 18  18  20    Calcium 8.9 - 10.3 mg/dL 7.8  8.0  7.9        Latest Ref Rng & Units 04/19/2023    4:02 AM 04/18/2023    9:02 AM 04/18/2023    4:21 AM  CBC  WBC 4.0 - 10.5 K/uL 6.9  6.6  5.7   Hemoglobin 13.0 - 17.0 g/dL 7.5  7.0  7.0   Hematocrit 39.0 - 52.0 % 24.0  22.4  22.4   Platelets 150 - 400 K/uL 215  227  219     Time spent: 45 minutes  Author: Arnetha Courser, MD 04/19/2023 4:42 PM  For on call review [email or web address].

## 2023-04-19 NOTE — Progress Notes (Signed)
PT Cancellation Note  Patient Details Name: [name] MRN: [id number] DOB: 17-Dec-1940   Cancelled Treatment:    Reason Eval/Treat Not Completed: Patient not medically ready. Per chart review and MD orders, will hold PT eval at this time secondary to elevated K+ value. Will attempt PT eval at a later time.   [name] 04/19/2023, 10:04 AM

## 2023-04-19 NOTE — Progress Notes (Signed)
OT Cancellation Note  Patient Details Name: [name] MRN: [id number] DOB: 03/03/1941   Cancelled Treatment:    Reason Eval/Treat Not Completed: Other (comment) (pt with elevated K+, per MD hold at this time. OT will reattempt as able.Oleta Mouse, OTD OTR/L  04/19/23, 10:50 AM

## 2023-04-19 NOTE — TOC CM/SW Note (Signed)
PT OT consult pending Evals held today due to elevated K Per [place] with [facility] patient is active with home health PT Patient currently requiring 2L acute o2

## 2023-04-19 NOTE — Progress Notes (Signed)
Patient ID: [name], male   DOB: Jun 22, 1940, 82 y.o.   MRN: [id number]  Subjective: Overall, the patient feels that his left knee pain is much improved this morning.  He still notes some discomfort in the knee, but feels that his symptoms are much better and that he is able to move it more freely and is able to lift it off the bed.  Regarding his right shoulder and upper extremity, he has no complaints.  Objective: Vital signs in last 24 hours: Temp:  [97.5 F (36.4 C)-98.5 F (36.9 C)] 98.5 F (36.9 C) (11/13 0756) Pulse Rate:  [78-86] 83 (11/13 0756) Resp:  [16-20] 20 (11/13 0756) BP: (124-141)/(61-73) 131/73 (11/13 0756) SpO2:  [97 %-99 %] 97 % (11/13 0756)  Intake/Output from previous day: 11/12 0701 - 11/13 0700 In: 1366.7 [Blood:315; IV Piggyback:1051.7] Out: 200 [Urine:200] Intake/Output this shift: No intake/output data recorded.  Recent Labs    04/17/23 2017 04/18/23 0138 04/18/23 0421 04/18/23 0902 04/19/23 0402  HGB 7.4* 7.0* 7.0* 7.0* 7.5*   Recent Labs    04/18/23 0902 04/19/23 0402  WBC 6.6 6.9  RBC 2.36* 2.55*  HCT 22.4* 24.0*  PLT 227 215   Recent Labs    04/18/23 0902 04/19/23 0402  NA 136 138  K 5.0 6.1*  CL 108 109  CO2 18* 18*  BUN 64* 70*  CREATININE 2.96* 2.94*  GLUCOSE 115* 208*  CALCIUM 8.0* 7.8*   No results for input(s): "LABPT", "INR" in the last 72 hours.  Physical Exam: Orthopedic examination of the left knee is notable for mild residual swelling, as well as a 1+ effusion.  He still has mild tenderness to palpation around the knee, but notes that it is much less then it was yesterday.  He is able to perform an active straight leg raise and is able to actively range his knee from 20 to 70 degrees with only minimal discomfort.  No erythema, ecchymosis, abrasions, or other skin abnormalities are identified.  He is grossly neurovascularly intact to the left lower extremity and foot.  Orthopedic examination of the right shoulder  is as noted yesterday.  The wound continues to appear to be healing well as there is no erythema or drainage from the wound.  He is neurovascularly intact to the right upper extremity and hand.  Assessment: 1.  Status post reverse right total shoulder arthroplasty. 2.  Acute left knee effusion with underlying degenerative joint disease and chondrocalcinosis, improved symptomatically.  Plan: The treatment options have been reviewed to the patient.  As his knee symptoms are much improved, he may begin to be mobilized with physical therapy, weightbearing as tolerated on the left leg.  Again, he may not weight-bear through the right shoulder or upper extremity so he will need to use a hemiwalker or a cane in the left hand for balance and support.  Regarding his right shoulder, he may begin occupational therapy, working on passive and active assisted range of motion exercises for the right hand, wrist, and elbow, as well as gentle strength exercises for these portions of the arm as well.  Gentle passive range of motion of the shoulder, such as pendulum exercises, also may be initiated.   [name] 04/19/2023, 8:04 AM

## 2023-04-19 NOTE — Plan of Care (Signed)

## 2023-04-20 DIAGNOSIS — R410 Disorientation, unspecified: Secondary | ICD-10-CM | POA: Diagnosis not present

## 2023-04-20 DIAGNOSIS — N179 Acute kidney failure, unspecified: Secondary | ICD-10-CM | POA: Diagnosis not present

## 2023-04-20 DIAGNOSIS — M25562 Pain in left knee: Secondary | ICD-10-CM | POA: Diagnosis not present

## 2023-04-20 DIAGNOSIS — E875 Hyperkalemia: Secondary | ICD-10-CM | POA: Diagnosis not present

## 2023-04-20 LAB — RENAL FUNCTION PANEL
Albumin: 2.6 g/dL — ABNORMAL LOW (ref 3.5–5.0)
Anion gap: 5 (ref 5–15)
BUN: 77 mg/dL — ABNORMAL HIGH (ref 8–23)
CO2: 21 mmol/L — ABNORMAL LOW (ref 22–32)
Calcium: 8.1 mg/dL — ABNORMAL LOW (ref 8.9–10.3)
Chloride: 112 mmol/L — ABNORMAL HIGH (ref 98–111)
Creatinine, Ser: 2.53 mg/dL — ABNORMAL HIGH (ref 0.61–1.24)
GFR, Estimated: 25 mL/min — ABNORMAL LOW (ref >60)
Glucose, Bld: 182 mg/dL — ABNORMAL HIGH (ref 70–99)
Phosphorus: 2.6 mg/dL (ref 2.5–4.6)
Potassium: 5.8 mmol/L — ABNORMAL HIGH (ref 3.5–5.1)
Sodium: 138 mmol/L (ref 135–145)

## 2023-04-20 LAB — CBC
HCT: 25.2 % — ABNORMAL LOW (ref 39.0–52.0)
Hemoglobin: 7.8 g/dL — ABNORMAL LOW (ref 13.0–17.0)
MCH: 29.2 pg (ref 26.0–34.0)
MCHC: 31 g/dL (ref 30.0–36.0)
MCV: 94.4 fL (ref 80.0–100.0)
Platelets: 245 10*3/uL (ref 150–400)
RBC: 2.67 MIL/uL — ABNORMAL LOW (ref 4.22–5.81)
RDW: 18.4 % — ABNORMAL HIGH (ref 11.5–15.5)
WBC: 9 10*3/uL (ref 4.0–10.5)
nRBC: 0.9 % — ABNORMAL HIGH (ref 0.0–0.2)

## 2023-04-20 NOTE — Care Management Important Message (Signed)
Important Message  Patient Details  Name: [name] MRN: [id number] Date of Birth: 11-05-1940   Important Message Given:  N/A - LOS <3 / Initial given by admissions     [name] 04/20/2023, 12:30 PM

## 2023-04-20 NOTE — Progress Notes (Signed)
Progress Note   Patient: [name] QMV:[id number] DOB: [birth year] DOA: 04/17/2023     3 DOS: the patient was seen and examined on 04/20/2023    Brief hospital course:  [name] is a 82 y.o. male with medical history significant for CVA, primary myelofibrosis, BPH, CKD stage IIIb, who presents to the ED primarily for left knee pain and swelling after he twisted his knee when attempting to transfer from a chair(felt up pop).  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  Patient is status post left knee arthrocentesis.  Orthopedics following.    11/13: Hemodynamically stable, potassium at 6.1, creatinine 2.94, baseline appears to be around 2.3.  Ordered Veltassa and giving some more IV fluid.  11/14: Improving potassium but still elevated at 5.8.  Slowly improving creatinine.  PT is recommending home health. Will monitor 1 more day.  Subjective: Patient was seen and examined today.  No new concern.  Working with PT.   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  Synovial fluid with calcium pyruvate crystals and  WBC of 39,604 Serum uric acid of 8.8 Initiate prednisone therapy PT OT on board Orthopedic consulted, steroid knee injection was also done    Hallucinations Suspect delirium Presents with auditory and tactile hallucinations, starting 1 day postop Multifactorial related to clinical dehydration with AKI, pain, pain meds, recent surgery CT head nonacute Monitor neurochecks Continue delirium precautions and fall precautions  Hyperkalemia.  Slowly improving potassium, currently at 5.8 -Continue with Veltassa -Monitor potassium  Acute renal failure superimposed  on stage 3b chronic kidney disease (HCC)-improved Hyperkalemia- Creatinine 3.24, up from baseline of 1.91 to 2.3 a couple months prior .  Potassium at 6.1 today Continue IV fluid therapy as well as Encourage oral rehydration Giving some Veltassa -Monitor renal function   Acute on chronic anemia hemoglobin 7.0>>7.5, down from 9.4 a couple months prior, Receives erythropoietin by oncology S/p 1 unit of PRBC Continue to monitor serial H&H and transfuse to keep hemoglobin greater than 7.   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   Minimal swelling right hand orthopedics on board.  Appreciate input     Primary myelofibrosis (HCC) History of melanoma Followed by oncology, last seen September 2024   BPH (benign prostatic hyperplasia) Continue tamsulosin   Late effects of cerebrovascular disease Continue home meds   DVT prophylaxis: Lovenox   Consults: Orthopedics, Signa Kell, MD   Advance Care Planning:   Code Status: Prior . Discussed with wife at bedside   Family Communication: Discussed with wife and son at bedside   Disposition Plan: Back to previous home environment    Physical Exam:   Vitals:   04/19/23 1936 04/20/23 0345 04/20/23 0424 04/20/23 0801  BP: (!) 141/70 (!) 145/71  (!) 162/69  Pulse: 81 81  79  Resp: 18 18  18   Temp: 97.8 F (36.6 C) 98 F (36.7 C)  98.6 F (37 C)  TempSrc:  Oral    SpO2: 98% 97%  95%  Weight:   87.2 kg   Height:       General.  Frail elderly man, in  no acute distress. Pulmonary.  Lungs clear bilaterally, normal respiratory effort. CV.  Regular rate and rhythm, no JVD, rub or murmur. Abdomen.  Soft, nontender, nondistended, BS positive. CNS.  Alert and oriented .  No focal neurologic deficit. Extremities.  No edema, no cyanosis, pulses intact and symmetrical.  Right right shoulder in sling Psychiatry.  Judgment and insight appears  normal.   Data Reviewed: Prior data reviewed      Latest Ref Rng & Units 04/20/2023    4:48 AM 04/19/2023    4:02 AM 04/18/2023    9:02 AM  BMP  Glucose 70 - 99 mg/dL 161  096  045   BUN 8 - 23 mg/dL 77  70  64   Creatinine 0.61 - 1.24 mg/dL 4.09  8.11  9.14   Sodium 135 - 145 mmol/L 138  138  136   Potassium 3.5 - 5.1 mmol/L 5.8  6.1  5.0   Chloride 98 - 111 mmol/L 112  109  108   CO2 22 - 32 mmol/L 21  18  18    Calcium 8.9 - 10.3 mg/dL 8.1  7.8  8.0        Latest Ref Rng & Units 04/20/2023    4:48 AM 04/19/2023    4:02 AM 04/18/2023    9:02 AM  CBC  WBC 4.0 - 10.5 K/uL 9.0  6.9  6.6   Hemoglobin 13.0 - 17.0 g/dL 7.8  7.5  7.0   Hematocrit 39.0 - 52.0 % 25.2  24.0  22.4   Platelets 150 - 400 K/uL 245  215  227     Time spent: 45 minutes  Author: Arnetha Courser, MD 04/20/2023 3:19 PM  For on call review [email or web address].

## 2023-04-20 NOTE — Progress Notes (Signed)
Patient ID: [name], male   DOB: [birth year], 82 y.o.   MRN: [id number]  Subjective: No specific complaints pertaining to his right shoulder.  He notes that his left knee symptoms continue to improve.  He is actually able to get up and ambulate with the assistance of a quad cane in his left hand with the physical therapist.  He denies any other orthopedic complaints at this time.   Objective: Vital signs in last 24 hours: Temp:  [97.8 F (36.6 C)-98.6 F (37 C)] 98.6 F (37 C) (11/14 0801) Pulse Rate:  [79-81] 79 (11/14 0801) Resp:  [18] 18 (11/14 0801) BP: (141-162)/(69-71) 162/69 (11/14 0801) SpO2:  [95 %-98 %] 95 % (11/14 0801) Weight:  [87.2 kg] 87.2 kg (11/14 0424)  Intake/Output from previous day: 11/13 0701 - 11/14 0700 In: 2668.2 [P.O.:960; I.V.:1708.2] Out: 2300 [Urine:2300] Intake/Output this shift: Total I/O In: 360 [P.O.:360] Out: -   Recent Labs    04/18/23 0138 04/18/23 0421 04/18/23 0902 04/19/23 0402 04/20/23 0448  HGB 7.0* 7.0* 7.0* 7.5* 7.8*   Recent Labs    04/19/23 0402 04/20/23 0448  WBC 6.9 9.0  RBC 2.55* 2.67*  HCT 24.0* 25.2*  PLT 215 245   Recent Labs    04/19/23 0402 04/20/23 0448  NA 138 138  K 6.1* 5.8*  CL 109 112*  CO2 18* 21*  BUN 70* 77*  CREATININE 2.94* 2.53*  GLUCOSE 208* 182*  CALCIUM 7.8* 8.1*   No results for input(s): "LABPT", "INR" in the last 72 hours.  Physical Exam: Orthopedic examination of the left knee shows further improvement in his left knee swelling as compared to yesterday.  There is still some mild tenderness to palpation around the knee, but there is no erythema, ecchymosis, or other skin abnormalities identified.  He is able to perform an active straight leg raise and can actively range his knee from 10 to 90 degrees.  He is grossly neurovascularly intact to the left lower extremity and foot.  Orthopedic examination of the left shoulder is unchanged as compared to yesterday and the day before.  The  wound appears to be healing well and is without evidence for infection.  He is neurovascularly intact to the right upper extremity and hand.  Assessment: 1.  Status post reverse right total shoulder arthroplasty. 2.  Acute left knee effusion with underlying degenerative joint disease and chondrocalcinosis, improved symptomatically.  Plan: The treatment options have been discussed with the patient.  The patient will continue to be mobilized with physical therapy, weightbearing as tolerated on the left leg with a quad cane in his left hand for balance and support.  He is cleared for discharge home from an orthopedic standpoint at this time, but ultimately will need clearance from medical standpoint before his actual discharge.  He will follow-up as scheduled in our office next week  Thank you for asking me to participate in the care of this post delightful man.  I will sign off at this time.  If you have further need of orthopedic input during this hospitalization, please reconsult me.  Excell Seltzer [name] 04/20/2023, 3:57 PM

## 2023-04-20 NOTE — Evaluation (Signed)
Occupational Therapy Evaluation Patient Details Name: [name] MRN: [id number] DOB: 07-25-1940 Today's Date: 04/20/2023   History of Present Illness Pt is a 82 y.o. male with medical history significant for who presents to the ED on 04/17/23 primarily for left knee pain and swelling after he twisted his knee when attempting to transfer from a chair(felt up pop). 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   Clinical Impression   Pt was seen for OT evaluation this date. Pt was alert and oriented x4, with family at bedside. 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 Pt lives with his wife in multi level home; able to live on main level with 4 steps to enter. Pt presents to acute OT demonstrating impaired ADL performance and functional mobility 2/2 (See OT problem list for additional functional deficits). Pt currently requires MINA for LLE management during bed mobility. Pt amb to recliner from EOB, MIN A for lift off, CGA throughout amb ~22ft with use of WB quad cane on L side. Pt was notably unsteady with cane, no LOB noted. Pt reeducated on ROM exercises with digits/wrist/elbow to prevent further stiffness. Pt set up for breakfast at end of session. Pt would benefit from skilled OT services to address noted impairments and functional limitations (see below for any additional details) in order to maximize safety and independence while minimizing falls risk and caregiver burden. OT will follow acutely.       If plan is discharge home, recommend the following: A little help with walking and/or transfers;A lot of help with bathing/dressing/bathroom;Assistance with cooking/housework;Assist for transportation;Help with stairs or ramp for entrance;Direct supervision/assist  for medications management    Functional Status Assessment  Patient has had a recent decline in their functional status and demonstrates the ability to make significant improvements in function in a reasonable and predictable amount of time.  Equipment Recommendations  Other (comment) (next venue of care)    Recommendations for Other Services       Precautions / Restrictions Precautions Precautions: Fall;Shoulder Type of Shoulder Precautions: shoulder abd pillow at all times Shoulder Interventions: Shoulder sling/immobilizer;Shoulder abduction pillow;At all times;Off for dressing/bathing/exercises Precaution Booklet Issued: No Restrictions Weight Bearing Restrictions: Yes RUE Weight Bearing: Non weight bearing Other Position/Activity Restrictions: elbow/wrist/hand ROM ok      Mobility Bed Mobility Overal bed mobility: Needs Assistance Bed Mobility: Supine to Sit     Supine to sit: Min assist, HOB elevated, Used rails     General bed mobility comments: MIN A for LLE management    Transfers Overall transfer level: Needs assistance Equipment used: Quad cane Transfers: Sit to/from Stand, Bed to chair/wheelchair/BSC Sit to Stand: MINA for lift off            General transfer comment: Amb to recliner from EOB ~34ft, with use of WB quad cane, pt unsteady during steps with cane in L UE, no LOB noted      Balance Overall balance assessment: Needs assistance Sitting-balance support: No upper extremity supported, Feet supported Sitting balance-Leahy Scale: Normal Sitting balance - Comments: Able to maintain seated EOB/seat balance without LOB noted during adjusting of RUE sling   Standing balance support: Single extremity supported, During functional activity Standing balance-Leahy Scale: Fair  ADL either performed or assessed with clinical judgement   ADL Overall ADL's : Needs assistance/impaired                 Upper  Body Dressing : Maximal assistance;Sitting Upper Body Dressing Details (indicate cue type and reason): adjusting UE sling pre/post mobility     Toilet Transfer: Contact guard assist;Ambulation;Cueing for safety Toilet Transfer Details (indicate cue type and reason): simulated EOB<> recliner with WB cane         Functional mobility during ADLs: Contact guard assist;Cueing for safety (WB quad cane)       Vision         Perception         Praxis         Pertinent Vitals/Pain Pain Assessment Pain Assessment: No/denies pain     Extremity/Trunk Assessment Upper Extremity Assessment Upper Extremity Assessment: Generalized weakness;Right hand dominant;RUE deficits/detail RUE Deficits / Details: s/p rev TSA   Lower Extremity Assessment Lower Extremity Assessment: Generalized weakness;LLE deficits/detail LLE Deficits / Details: left knee arthrocentesis       Communication Communication Communication: Hearing impairment Cueing Techniques: Verbal cues;Tactile cues   Cognition Arousal: Alert Behavior During Therapy: WFL for tasks assessed/performed Overall Cognitive Status: Within Functional Limits for tasks assessed                                 General Comments: Alert and oriented x4  throughout     General Comments       Exercises Other Exercises Other Exercises: Edu pt/wife: role of OT, role of rehab, ROM exercises to prevent futher stiffness   Shoulder Instructions      Home Living Family/patient expects to be discharged to:: Private residence Living Arrangements: Spouse/significant other Available Help at Discharge: Family;Available 24 hours/day;Available PRN/intermittently Type of Home: House Home Access: Stairs to enter [employer] of Steps: 4 Entrance Stairs-Rails: Right;Left Home Layout: Multi-level;Able to live on main level with bedroom/bathroom     Bathroom Shower/Tub: Producer, television/film/video: Handicapped  height     Home Equipment: Other (comment) (wide base QC)   Additional Comments: *wide base QC      Prior Functioning/Environment Prior Level of Function : Independent/Modified Independent;Driving;History of Falls (last six months)             Mobility Comments: WB QC but reports he can walk without it, driving, active per pt report; Pt reports 5-6 falls in past 6 months ADLs Comments: Indep with ADLs but limited with RUE due to pain prior to surgery        OT Problem List: Impaired UE functional use;Decreased knowledge of precautions;Impaired balance (sitting and/or standing);Decreased knowledge of use of DME or AE;Decreased range of motion;Impaired sensation;Decreased strength;Decreased coordination;Decreased activity tolerance      OT Treatment/Interventions: Self-care/ADL training;Therapeutic exercise;Patient/family education;Balance training;Energy conservation;DME and/or AE instruction;Therapeutic activities    OT Goals(Current goals can be found in the care plan section) Acute Rehab OT Goals Patient Stated Goal: To get better OT Goal Formulation: With patient/family Time For Goal Achievement: 05/04/23 Potential to Achieve Goals: Good ADL Goals Pt Will Perform Grooming: standing;with modified independence Pt Will Perform Upper Body Dressing: with min assist;sitting Pt Will Perform Lower Body Dressing: sit to/from stand;with min assist Pt Will Transfer to Toilet: with modified independence;ambulating Pt Will Perform Toileting - Clothing Manipulation and hygiene: with modified independence;sit to/from stand Pt/caregiver will Perform Home Exercise Program: Increased  ROM;Right Upper extremity;With written HEP provided  OT Frequency: Min 1X/week    Co-evaluation              AM-PAC OT "6 Clicks" Daily Activity     Outcome Measure Help from another person eating meals?: A Little Help from another person taking care of personal grooming?: A Little Help from another  person toileting, which includes using toliet, bedpan, or urinal?: A Little Help from another person bathing (including washing, rinsing, drying)?: A Lot Help from another person to put on and taking off regular upper body clothing?: A Lot Help from another person to put on and taking off regular lower body clothing?: A Lot 6 Click Score: 15   End of Session Equipment Utilized During Treatment: Other (comment);Gait belt (WB quad cane) Nurse Communication: Mobility status  Activity Tolerance: Patient tolerated treatment well Patient left: in chair;with call bell/phone within reach;with family/visitor present  OT Visit Diagnosis: Other abnormalities of gait and mobility (R26.89);Repeated falls (R29.6);Muscle weakness (generalized) (M62.81)                Time: 6213-0865 OT Time Calculation (min): 26 min Charges:  OT General Charges $OT Visit: 1 Visit OT Evaluation $OT Eval Moderate Complexity: 1 Mod  [employer], OTS

## 2023-04-20 NOTE — Evaluation (Signed)
Physical Therapy Evaluation Patient Details Name: [name] MRN: [id number] DOB: Jun 19, 1940 Today's Date: 04/20/2023  History of Present Illness  Pt is a 82 y.o. male with medical history significant for who presents to the ED on 04/17/23 primarily for left knee pain and swelling after he twisted his knee when attempting to transfer from a chair(felt up pop). 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  Clinical Impression  Pt is received in recliner with family/friends at bedside, he is agreeable to PT session. At baseline, Pt reports using wide base quad cane for amb, requiring assist with ADL/IADLs due to R shoulder pain ahead of rev TSA, and frequent falls in last 6 months. Pt performs transfers and amb CGA for safety with use of wide base quad cane on L side to increase stability. 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 Educated Pt on benefits of OOB mobility as long as medically cleared and performing BLE exercises to promote strength-Pt verbalized understanding. Pt would benefit from skilled PT to address above deficits and promote optimal return to PLOF.        If plan is discharge home, recommend the following: A little help with walking and/or transfers;A little help with bathing/dressing/bathroom;Assist for transportation;Help with stairs or ramp for entrance   Can travel by private vehicle        Equipment Recommendations None recommended by PT  Recommendations for Other Services       Functional Status Assessment Patient has had a recent decline in their functional status and demonstrates the ability to make significant improvements in function in a reasonable and predictable amount of time.     Precautions /  Restrictions Precautions Precautions: Fall;Shoulder Type of Shoulder Precautions: shoulder abd pillow at all times Shoulder Interventions: Shoulder sling/immobilizer;Shoulder abduction pillow;At all times;Off for dressing/bathing/exercises Precaution Booklet Issued: No Restrictions Weight Bearing Restrictions: Yes RUE Weight Bearing: Non weight bearing Other Position/Activity Restrictions: elbow/wrist/hand ROM ok      Mobility  Bed Mobility               General bed mobility comments: NT due to Pt received in recliner and ended in recliner at end of session    Transfers Overall transfer level: Needs assistance Equipment used: Quad cane Transfers: Bed to chair/wheelchair/BSC, Sit to/from Stand Sit to Stand: Contact guard assist   Step pivot transfers: Contact guard assist       General transfer comment: Able to perform step pivot transfer from chair>bed and STS from bed CGA for safety and use of wide quad cane on L side    Ambulation/Gait Ambulation/Gait assistance: Contact guard assist Gait Distance (Feet): 20 Feet Assistive device: Quad cane (wide base) Gait Pattern/deviations: Step-through pattern, Decreased step length - right, Decreased stance time - right, Decreased step length - left, Decreased stride length, Antalgic Gait velocity: Slight decreased     General Gait Details: Able to amb approx 34ft using wide base quad cane on L side CGA for safety and line management; notable min limb/decreased stance time on RLE secondary to OA; able to navigate tight areas without LOB noted  Stairs            Wheelchair Mobility     Tilt Bed  Modified Rankin (Stroke Patients Only)       Balance Overall balance assessment: Needs assistance Sitting-balance support: No upper extremity supported, Feet supported Sitting balance-Leahy Scale: Normal Sitting balance - Comments: Able to maintain seated EOB/seat balance without LOB noted   Standing balance  support: Single extremity supported, During functional activity Standing balance-Leahy Scale: Fair Standing balance comment: Able to maintain standing balance with use of quad cane on L side and occassional brief moments of lifting cane off floor without LOB noted                             Pertinent Vitals/Pain Pain Assessment Pain Assessment: No/denies pain    Home Living Family/patient expects to be discharged to:: Private residence Living Arrangements: Spouse/significant other Available Help at Discharge: Family;Available 24 hours/day;Available PRN/intermittently Type of Home: House Home Access: Stairs to enter Entrance Stairs-Rails: Right;Left Entrance Stairs-Number of Steps: 4   Home Layout: Multi-level;Able to live on main level with bedroom/bathroom Home Equipment: Other (comment) Additional Comments: *wide base QC    Prior Function Prior Level of Function : Independent/Modified Independent;Working/employed;Driving;History of Falls (last six months)             Mobility Comments: WB QC but reports he can walk without it, working, driving, active per pt report; Pt reports 5-6 falls in past 6 months ADLs Comments: Indepen with ADLs but limited with RUE due to pain prior to surgery     Extremity/Trunk Assessment   Upper Extremity Assessment Upper Extremity Assessment: Defer to OT evaluation;RUE deficits/detail RUE Deficits / Details: s/p rev TSA    Lower Extremity Assessment Lower Extremity Assessment: Generalized weakness       Communication   Communication Communication: Hearing impairment (HOH) Cueing Techniques: Verbal cues  Cognition Arousal: Alert Behavior During Therapy: WFL for tasks assessed/performed Overall Cognitive Status: Within Functional Limits for tasks assessed                                 General Comments: AO x4; pleasant and motivated to work with PT during session        General Comments       Exercises Other Exercises Other Exercises: Educated Pt and family regarding PT role and benefits of amb with medical clearance   Assessment/Plan    PT Assessment Patient needs continued PT services  PT Problem List Decreased strength;Decreased mobility;Decreased range of motion;Decreased activity tolerance;Decreased balance;Decreased coordination;Pain       PT Treatment Interventions DME instruction;Therapeutic exercise;Functional mobility training;Stair training;Gait training;Therapeutic activities;Balance training    PT Goals (Current goals can be found in the Care Plan section)  Acute Rehab PT Goals Patient Stated Goal: to go home PT Goal Formulation: With patient Time For Goal Achievement: 05/04/23 Potential to Achieve Goals: Good    Frequency Min 1X/week     Co-evaluation               AM-PAC PT "6 Clicks" Mobility  Outcome Measure Help needed turning from your back to your side while in a flat bed without using bedrails?: A Little Help needed moving from lying on your back to sitting on the side of a flat bed without using bedrails?: A Little Help needed moving to and from a bed to a chair (including a wheelchair)?: A Little Help needed standing up from a chair using your arms (e.g., wheelchair or bedside chair)?: A  Little Help needed to walk in hospital room?: A Little Help needed climbing 3-5 steps with a railing? : A Lot 6 Click Score: 17    End of Session Equipment Utilized During Treatment: Gait belt;Other (comment) (RUE shoulder abd pillow/immobilize sling) Activity Tolerance: Patient tolerated treatment well Patient left: in chair;with call bell/phone within reach;with chair alarm set;with family/visitor present Nurse Communication: Mobility status;Other (comment) (Notified RN regarding dislodge of condom cath and chair alarm not connected to wall) PT Visit Diagnosis: Unsteadiness on feet (R26.81);History of falling (Z91.81);Repeated falls (R29.6);Muscle  weakness (generalized) (M62.81);Pain Pain - Right/Left: Right Pain - part of body: Shoulder    Time: 5784-6962 PT Time Calculation (min) (ACUTE ONLY): 20 min   Charges:                 Elmon Else, SPT   Devario Bucklew 04/20/2023, 11:13 AM

## 2023-04-20 NOTE — Progress Notes (Signed)
Mobility Specialist - Progress Note   04/20/23 1400  Mobility  Activity Transferred from chair to bed;Ambulated with assistance in room  Level of Assistance Contact guard assist, steadying assist  Assistive Device Cane  Distance Ambulated (ft) 6 ft  Activity Response Tolerated well  $Mobility charge 1 Mobility     Pt sitting in recliner upon arrival, utilizing RA. STS from chair with CGA + extra time. Max verbal cues for sequencing steps towards recliner. No LOB. Lateral steps towards HOB before returning supine with minA. Pt left in bed with alarm set, needs in reach. RN present during and after transfer.    Filiberto Pinks Mobility Specialist 04/20/23, 2:43 PM

## 2023-04-21 ENCOUNTER — Encounter: Payer: Self-pay | Admitting: Oncology

## 2023-04-21 ENCOUNTER — Other Ambulatory Visit (HOSPITAL_COMMUNITY): Payer: Self-pay

## 2023-04-21 DIAGNOSIS — N179 Acute kidney failure, unspecified: Secondary | ICD-10-CM | POA: Diagnosis not present

## 2023-04-21 DIAGNOSIS — M25462 Effusion, left knee: Secondary | ICD-10-CM | POA: Insufficient documentation

## 2023-04-21 DIAGNOSIS — M25562 Pain in left knee: Secondary | ICD-10-CM | POA: Diagnosis not present

## 2023-04-21 DIAGNOSIS — M11262 Other chondrocalcinosis, left knee: Secondary | ICD-10-CM | POA: Insufficient documentation

## 2023-04-21 DIAGNOSIS — Z85820 Personal history of malignant melanoma of skin: Secondary | ICD-10-CM

## 2023-04-21 DIAGNOSIS — R4182 Altered mental status, unspecified: Secondary | ICD-10-CM

## 2023-04-21 DIAGNOSIS — E875 Hyperkalemia: Secondary | ICD-10-CM | POA: Diagnosis not present

## 2023-04-21 DIAGNOSIS — R410 Disorientation, unspecified: Secondary | ICD-10-CM | POA: Diagnosis not present

## 2023-04-21 DIAGNOSIS — D471 Chronic myeloproliferative disease: Secondary | ICD-10-CM

## 2023-04-21 LAB — CBC
HCT: 26.3 % — ABNORMAL LOW (ref 39.0–52.0)
Hemoglobin: 8.3 g/dL — ABNORMAL LOW (ref 13.0–17.0)
MCH: 29.4 pg (ref 26.0–34.0)
MCHC: 31.6 g/dL (ref 30.0–36.0)
MCV: 93.3 fL (ref 80.0–100.0)
Platelets: 295 10*3/uL (ref 150–400)
RBC: 2.82 MIL/uL — ABNORMAL LOW (ref 4.22–5.81)
RDW: 18.1 % — ABNORMAL HIGH (ref 11.5–15.5)
WBC: 10.9 10*3/uL — ABNORMAL HIGH (ref 4.0–10.5)
nRBC: 1 % — ABNORMAL HIGH (ref 0.0–0.2)

## 2023-04-21 LAB — BASIC METABOLIC PANEL
Anion gap: 8 (ref 5–15)
BUN: 78 mg/dL — ABNORMAL HIGH (ref 8–23)
CO2: 18 mmol/L — ABNORMAL LOW (ref 22–32)
Calcium: 8.4 mg/dL — ABNORMAL LOW (ref 8.9–10.3)
Chloride: 112 mmol/L — ABNORMAL HIGH (ref 98–111)
Creatinine, Ser: 2.52 mg/dL — ABNORMAL HIGH (ref 0.61–1.24)
GFR, Estimated: 25 mL/min — ABNORMAL LOW (ref >60)
Glucose, Bld: 152 mg/dL — ABNORMAL HIGH (ref 70–99)
Potassium: 5.5 mmol/L — ABNORMAL HIGH (ref 3.5–5.1)
Sodium: 138 mmol/L (ref 135–145)

## 2023-04-21 MED ORDER — NEPRO PO LIQD: 1.0000 | Freq: Two times a day (BID) | ORAL | 11 refills | Status: DC

## 2023-04-21 MED ORDER — LOKELMA 5 G PO PACK: 5.0000 g | PACK | Freq: Two times a day (BID) | ORAL | 0 refills | Status: AC

## 2023-04-21 MED ORDER — SODIUM ZIRCONIUM CYCLOSILICATE 10 G PO PACK
10.0000 g | PACK | Freq: Once | ORAL | Status: DC
  Filled 2023-04-21: qty 1

## 2023-04-21 MED ORDER — SODIUM BICARBONATE 650 MG PO TABS
1300.0000 mg | ORAL_TABLET | Freq: Two times a day (BID) | ORAL | Status: DC
  Administered 2023-04-21: 1300 mg via ORAL
  Filled 2023-04-21: qty 2

## 2023-04-21 MED ORDER — PATIROMER SORBITEX CALCIUM 8.4 G PO PACK: 16.8000 g | PACK | Freq: Every day | ORAL | 0 refills | Status: DC

## 2023-04-21 MED ORDER — SODIUM BICARBONATE 650 MG PO TABS: 1300.0000 mg | ORAL_TABLET | Freq: Two times a day (BID) | ORAL | 1 refills | Status: DC

## 2023-04-21 MED ORDER — HYDRALAZINE HCL 25 MG PO TABS: 25.0000 mg | ORAL_TABLET | Freq: Three times a day (TID) | ORAL | 0 refills | Status: DC

## 2023-04-21 MED ORDER — FE FUM-VIT C-VIT B12-FA 460-60-0.01-1 MG PO CAPS: 1.0000 | ORAL_CAPSULE | Freq: Two times a day (BID) | ORAL | 0 refills | Status: DC

## 2023-04-21 MED ORDER — HYDRALAZINE HCL 25 MG PO TABS
25.0000 mg | ORAL_TABLET | Freq: Three times a day (TID) | ORAL | Status: DC
  Administered 2023-04-21: 25 mg via ORAL
  Filled 2023-04-21: qty 1

## 2023-04-21 NOTE — Plan of Care (Signed)
  Problem: Health Behavior/Discharge Planning: Goal: Ability to manage health-related needs will improve Outcome: Progressing   Problem: Clinical Measurements: Goal: Ability to maintain clinical measurements within normal limits will improve Outcome: Progressing Goal: Will remain free from infection Outcome: Progressing Goal: Diagnostic test results will improve Outcome: Progressing Goal: Respiratory complications will improve Outcome: Progressing Goal: Cardiovascular complication will be avoided Outcome: Progressing   Problem: Activity: Goal: Risk for activity intolerance will decrease Outcome: Progressing   Problem: Nutrition: Goal: Adequate nutrition will be maintained Outcome: Progressing   Problem: Coping: Goal: Level of anxiety will decrease Outcome: Progressing   Problem: Elimination: Goal: Will not experience complications related to bowel motility Outcome: Progressing Goal: Will not experience complications related to urinary retention Outcome: Progressing   Problem: Pain Management: Goal: General experience of comfort will improve Outcome: Progressing   Problem: Safety: Goal: Ability to remain free from injury will improve Outcome: Progressing

## 2023-04-21 NOTE — Discharge Summary (Addendum)
Physician Discharge Summary   Patient: [name] MRN: [id number] DOB: 03/11/1941  Admit date:     04/17/2023  Discharge date: 04/21/23  Discharge Physician: Arnetha Courser   PCP: [name], DO   Recommendations at discharge:  Please obtain CBC and renal function on follow-up Patient is being discharged on Veltassa for still mildly elevated potassium, although improving-please evaluate and stop as appropriate. Patient was also started on bicarb tablets due to persistent metabolic acidosis likely secondary to renal disease. Mild leukocytosis on the day of discharge likely secondary to steroid.  Completed the course of steroid while in the hospital and they were not continued. 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 *  Hospital Course: [name] is a 82 y.o. male with medical history significant for CVA, primary myelofibrosis, BPH, CKD stage IIIb, who presents to the ED primarily for left knee pain and swelling after he twisted his knee when attempting to transfer from a chair(felt up pop).  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  Patient is status post left knee  arthrocentesis.  Orthopedic surgery was consulted. Signed will fluid with calcium pyruvate crystals, he was treated with intra-articular steroid injection and 5 days of p.o. prednisone for pseudogout.  Symptoms improved.  Patient did develop AKI with history of CKD stage IIIb.  Creatinine slowly improved with gentle hydration and currently stable.  During that AKI he also developed hyperkalemia with potassium raised up to 6.1.  Slowly improving with Veltassa, it was 5.5 on the day of discharge and patient remained completely asymptomatic.  Patient was instructed to follow-up with nephrology within next few days and discharged on Veltassa.  He we discontinue home losartan and started on hydralazine and dose can be titrated by nephrology or primary care provider.  His nephrologist will determine the further need of Veltassa as appropriate.  Due to persistent metabolic acidosis likely secondary to renal disease, he was started on bicarb tablet and nephrology will take over as outpatient.  Patient also received 1 unit of PRBC when hemoglobin dropped to 7, he was started on supplement containing iron, folic acid and B12.  Home B12 supplements were discontinued.  Hemoglobin was 8.3 on the day of discharge.  He can continue getting EPO by nephrology as appropriate.  Patient will continue the rest of his home medications except with the changes as mentioned above and need to have a close follow-up with his providers for further management.  Assessment and Plan: Hallucinations Suspect delirium Presents with auditory and tactile hallucinations, starting 1 day postop Multifactorial related to clinical dehydration with AKI, pain, pain meds, recent surgery CT head nonacute Delirium precautions and fall precautions  Acute renal failure  superimposed on stage 3b chronic kidney disease (HCC) Hyperkalemia Creatinine 3.24, up from baseline of 1.91 a couple months prior with bicarb 20 and potassium 5.2 Hydrate  with 1 L bolus Encourage oral rehydration Monitor renal function and avoid nephrotoxins  Acute pain of left knee Primary osteoarthritis of the left knee Patient felt a pop in the knee the day prior and now has pain X-ray showing osteoarthritis, no significant effusion otherwise nonacute Follow-up synovial fluid studies PT OT eval-recommended home health which was ordered Orthopedic consult   Acute on chronic anemia hemoglobin 7.0>>7.5, down from 9.4 a couple months prior, Receives erythropoietin by oncology S/p 1 unit of PRBC-also started on supplement and hemoglobin was 8.3 on the day of discharge  S/P reverse total shoulder arthroplasty, 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   Minimal swelling right hand Right upper extremity venous Doppler was done in the ED and was negative for DVT of right IJ and subclavian Will consult orthopedics for eval and recommendations   Primary myelofibrosis (HCC) History of melanoma Followed by oncology, last seen September 2024  BPH (benign prostatic hyperplasia) Continue tamsulosin  Late effects of cerebrovascular disease Continue home meds  Consultants: Orthopedic surgery Procedures performed: Knee arthrocentesis and intra-articular steroid injection Disposition: Home health Diet recommendation:  Discharge Diet Orders (From admission, onward)     Start     Ordered   04/21/23 0000  Diet - low sodium heart healthy        04/21/23 1050           Cardiac diet DISCHARGE MEDICATION: Allergies as of 04/21/2023       Reactions   Meloxicam Nausea And Vomiting        Medication List     STOP taking these medications    losartan 100 MG tablet Commonly known as: COZAAR   VITAMIN B-12 PO       TAKE these medications    acetaminophen 500 MG tablet Commonly known as: TYLENOL Take 1,000 mg by mouth every 6 (six) hours as needed for mild pain (pain score 1-3).    amLODipine 10 MG tablet Commonly known as: NORVASC Take 1 tablet (10 mg total) by mouth every evening.   azelastine 0.1 % nasal spray Commonly known as: ASTELIN Place 1 spray into both nostrils 2 (two) times daily. Use in each nostril as directed   CITRACAL + D PO Take 1 tablet by mouth in the morning.   clopidogrel 75 MG tablet Commonly known as: PLAVIX TAKE ONE TABLET BY MOUTH EVERY DAY   D3-1000 PO Take 1,000 Units by mouth in the morning.   dapagliflozin propanediol 10 MG Tabs tablet Commonly known as: FARXIGA Take 10 mg by mouth.   DULoxetine 20 MG capsule Commonly known as: CYMBALTA Take 1 capsule (20 mg total) by mouth at bedtime. TAKE 1 CAPSULE BY MOUTH EVERY DAY   Fe Fum-Vit C-Vit B12-FA Caps capsule Commonly known as: TRIGELS-F FORTE Take 1 capsule by mouth 2 (two) times daily.   gabapentin 300 MG capsule Commonly known as: NEURONTIN Take 600 mg by mouth 3 (three) times daily.   hydrALAZINE 25 MG tablet Commonly known as: APRESOLINE Take 1 tablet (25 mg total) by mouth every 8 (eight) hours.   ketoconazole 2 % cream Commonly known as: NIZORAL Apply twice daily to affected area on buttocks until clear   levothyroxine 25 MCG tablet Commonly known as: SYNTHROID TAKE 1 TABLET EVERY DAY ON EMPTY  STOMACHWITH A GLASS OF WATER AT LEAST 30-60 MINBEFORE BREAKFAST   lidocaine 5 % Commonly known as: Lidoderm Place 1 patch onto the skin daily. Remove & Discard patch within 12 hours or as directed by MD What changed:  when to take this reasons to take this   meclizine 25 MG tablet Commonly known as: ANTIVERT TAKE 1 TABLET BY MOUTH THREE TIMES DAILYAS NEEDED FOR DIZZINESS   multivitamin with minerals Tabs tablet Take 1 tablet by mouth in the morning.   Naproxen Sodium 220 MG Caps Take 440 mg by mouth daily as needed (pain).   Nepro Liqd Take 1 Can by mouth 2 (two) times daily.   omeprazole 40 MG capsule Commonly known as: PRILOSEC Take 1 capsule (40 mg  total) by mouth at bedtime.   oxyCODONE 5 MG immediate release tablet Commonly known as: Roxicodone Take 1-2 tablets (5-10 mg total) by mouth every 4 (four) hours as needed for moderate pain (pain score 4-6) or severe pain (pain score 7-10).   patiromer 8.4 g packet Commonly known as: VELTASSA Take 2 packets (16.8 g total) by mouth daily.   simvastatin 40 MG tablet Commonly known as: ZOCOR Take 1 tablet (40 mg total) by mouth daily.   sodium bicarbonate 650 MG tablet Take 2 tablets (1,300 mg total) by mouth 2 (two) times daily.   tadalafil 5 MG tablet Commonly known as: CIALIS Take 1 tablet (5 mg total) by mouth daily. What changed: when to take this   tamsulosin 0.4 MG Caps capsule Commonly known as: FLOMAX Take 2 capsules (0.8 mg total) by mouth every evening.   terbinafine 250 MG tablet Commonly known as: LAMISIL Take 1 tablet (250 mg total) by mouth every evening.   zinc gluconate 50 MG tablet Take 50 mg by mouth in the morning.               Discharge Care Instructions  (From admission, onward)           Start     Ordered   04/21/23 0000  Leave dressing on - Keep it clean, dry, and intact until clinic visit        04/21/23 1050            Follow-up Information     Johnson, Megan P, DO. Go in 1 week(s).   Specialty: Family Medicine Why: appt on 11/19 @11 :00am Contact information: [address] [place] [address] [phone]         Mosetta Pigeon, MD. Go in 3 day(s).   Specialty: Nephrology Why: go to office between 8:30am-11:30am to re-check labs for renal function on 11/18 or 11/19 Contact information: [address] [place] [address] [phone]         Poggi, Excell Seltzer, MD. Go to.   Specialty: Orthopedic Surgery Why: appt on 11/22 @9 :45am Contact information: [address] [facility] [place] [address] [phone]                Discharge Exam: Filed Weights   04/17/23  1514 04/20/23 0424 04/21/23 0453  Weight: 82 kg 87.2 kg 86.9 kg   General.  Frail gentleman, in no acute distress. Pulmonary.  Lungs clear bilaterally, normal respiratory effort. CV.  Regular rate and rhythm, no JVD, rub or murmur. Abdomen.  Soft, nontender, nondistended, BS positive. CNS.  Alert and oriented .  No focal neurologic deficit. Extremities.  No edema, no cyanosis, pulses intact and symmetrical. Psychiatry.  Judgment and  insight appears normal.   Condition at discharge: stable  The results of significant diagnostics from this hospitalization (including imaging, microbiology, ancillary and laboratory) are listed below for reference.   Imaging Studies: US Venous Img Upper Right (DVT Study)  Result Date: 04/17/2023 CLINICAL DATA:  Hand swelling and pain. EXAM: RIGHT UPPER EXTREMITY VENOUS DOPPLER ULTRASOUND TECHNIQUE: Gray-scale sonography with graded compression, as well as color Doppler and duplex ultrasound were performed to evaluate the upper extremity deep venous system from the level of the subclavian vein and including the jugular, axillary, basilic, radial, ulnar and upper cephalic vein. Spectral Doppler was utilized to evaluate flow at rest and with distal augmentation maneuvers. COMPARISON:  None Available. FINDINGS: Contralateral Subclavian Vein: Respiratory phasicity is normal and symmetric with the symptomatic side. No evidence of thrombus. Normal compressibility. Internal Jugular Vein: No evidence of thrombus. Normal compressibility, respiratory phasicity and response to augmentation. Subclavian Vein: No evidence of thrombus. Normal compressibility, respiratory phasicity and response to augmentation. Axillary Vein: Unable to evaluate, as per the ultrasound technologist, secondary to recent surgery, limited patient positioning and patient altered mental status. Cephalic Vein: Unable to evaluate, as per the ultrasound technologist, secondary to recent surgery, limited patient  positioning and patient altered mental status. Basilic Vein: Unable to evaluate, as per the ultrasound technologist, secondary to recent surgery, limited patient positioning and patient altered mental status. Brachial Veins: Unable to evaluate, as per the ultrasound technologist, secondary to recent surgery, limited patient positioning and patient altered mental status. Radial Veins: Unable to evaluate, as per the ultrasound technologist, secondary to recent surgery, limited patient positioning and patient altered mental status. Ulnar Veins: Unable to evaluate, as per the ultrasound technologist, secondary to recent surgery, limited patient positioning and patient altered mental status. Venous Reflux:  None visualized. Other Findings:  None visualized. IMPRESSION: Markedly limited study, as described above, without evidence of DVT within the RIGHT internal jugular vein and RIGHT subclavian vein. Electronically Signed   By: Aram Candela M.D.   On: 04/17/2023 21:42   CT Head Wo Contrast  Result Date: 04/17/2023 CLINICAL DATA:  Mental status change, unknown cause Patient reports hearing a "pop" when sitting down in chair yesterday. Also had a reverse right shoulder replacement on 11/7- arm in sling with cooling system. EXAM: CT HEAD WITHOUT CONTRAST TECHNIQUE: Contiguous axial images were obtained from the base of the skull through the vertex without intravenous contrast. RADIATION DOSE REDUCTION: This exam was performed according to the departmental dose-optimization program which includes automated exposure control, adjustment of the mA and/or kV according to patient size and/or use of iterative reconstruction technique. COMPARISON:  MRI head 04/12/2022 FINDINGS: Brain: Patchy and confluent areas of decreased attenuation are noted throughout the deep and periventricular white matter of the cerebral hemispheres bilaterally, compatible with chronic microvascular ischemic disease. No evidence of  large-territorial acute infarction. No parenchymal hemorrhage. No mass lesion. No extra-axial collection. No mass effect or midline shift. No hydrocephalus. Basilar cisterns are patent. Vascular: No hyperdense vessel. Atherosclerotic calcifications are present within the cavernous internal carotid arteries. Skull: No acute fracture or focal lesion. Sinuses/Orbits: Bilateral maxillary sinus mucosal thickening. Otherwise paranasal sinuses and mastoid air cells are clear. Right scleral buckle. Bilateral lens replacement. Otherwise the orbits are unremarkable. Other: None. IMPRESSION: No acute intracranial abnormality. Electronically Signed   By: Tish Frederickson M.D.   On: 04/17/2023 21:40   DG Knee Complete 4 Views Left  Result Date: 04/17/2023 CLINICAL DATA:  Knee pain after injury.  Heard a pop.  EXAM: LEFT KNEE - COMPLETE 4+ VIEW COMPARISON:  None Available. FINDINGS: No evidence of fracture, dislocation, or significant joint effusion. Moderate osteoarthritis with tricompartmental peripheral spurring. Lateral tibiofemoral joint space narrowing. 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 Peripheral vascular calcifications. IMPRESSION: 1. No fracture or subluxation of the left knee. 2. Moderate osteoarthritis. 3. Chondrocalcinosis. Electronically Signed   By: Narda Rutherford M.D.   On: 04/17/2023 17:45   DG Shoulder Right Port  Result Date: 04/13/2023 CLINICAL DATA:  Postop arthroplasty EXAM: RIGHT SHOULDER - 2 VIEW COMPARISON:  Preop x-ray 02/28/2016.  CT 03/03/2019 FINDINGS: Skin staples identified. Soft tissue gas seen. Changes of reverse arthroplasty. Screw component along the glenoid and Press-Fit humeral component. Slight degenerative changes of the [facility] joint. Expected alignment. Imaging was obtained to aid in treatment. IMPRESSION: Acute surgical changes of right shoulder reverse arthroplasty. Electronically Signed   By: Karen Kays M.D.   On: 04/13/2023 17:48   [place] OR NERVE BLOCK-IMAGE ONLY [facility])  Result Date:  04/13/2023 There is no interpretation for this exam.  This order is for images obtained during a surgical procedure.  Please See "Surgeries" Tab for more information regarding the procedure.    Microbiology: Results for orders placed or performed during the hospital encounter of 04/17/23  Body fluid culture w Gram Stain     Status: None (Preliminary result)   Collection Time: 04/17/23 10:46 PM   Specimen: Body Fluid  Result Value Ref Range Status   Specimen Description   Final    FLUID Performed at [facility], [address] [place], [place] [address]    Special Requests   Final    KNEE Performed at [facility], [address] [place], [place] [address]    Gram Stain   Final    ABUNDANT WBC PRESENT, PREDOMINANTLY PMN NO ORGANISMS SEEN    Culture   Final    NO GROWTH 3 DAYS Performed at [facility], [address] [place], [place] [address]    Report Status PENDING  Incomplete  Culture, blood (Routine X 2) w Reflex to ID Panel     Status: None (Preliminary result)   Collection Time: 04/18/23  9:02 AM   Specimen: BLOOD  Result Value Ref Range Status   Specimen Description BLOOD LEFT ARM  Final   Special Requests   Final    BOTTLES DRAWN AEROBIC AND ANAEROBIC Blood Culture adequate volume   Culture   Final    NO GROWTH 3 DAYS Performed at [facility], [address] [place], [place] [address]    Report Status PENDING  Incomplete  Culture, blood (Routine X 2) w Reflex to ID Panel     Status: None (Preliminary result)   Collection Time: 04/18/23  9:02 AM   Specimen: BLOOD  Result Value Ref Range Status   Specimen Description BLOOD LEFT HAND  Final   Special Requests   Final    BOTTLES DRAWN AEROBIC AND ANAEROBIC Blood Culture adequate volume   Culture   Final    NO GROWTH 3 DAYS Performed at [facility], [address], [place], [place] [address]    Report Status PENDING  Incomplete  Body fluid culture w Gram Stain      Status: None (Preliminary result)   Collection Time: 04/18/23  3:28 PM   Specimen: Path fluid; Body Fluid  Result Value Ref Range Status   Specimen Description   Final    FLUID Performed at [facility], [address] [place], [place] [address]    Special Requests   Final    JOINT KNEE Performed at [facility], [address] [place], [place] [address]    Gram Stain   Final    MODERATE WBC PRESENT, PREDOMINANTLY PMN NO ORGANISMS SEEN    Culture   Final    NO GROWTH 3 DAYS Performed at [facility], [address], [place], [place] [address]    Report Status PENDING  Incomplete    Labs: CBC: Recent Labs  Lab 04/17/23 2017 04/18/23 0138 04/18/23 0421 04/18/23 0902 04/19/23 0402 04/20/23 0448 04/21/23 0316  WBC 8.6   < > 5.7 6.6 6.9 9.0 10.9*  NEUTROABS 6.2  --   --   --  5.0  --   --   HGB 7.4*   < > 7.0* 7.0* 7.5* 7.8* 8.3*  HCT 24.5*   < > 22.4* 22.4* 24.0* 25.2* 26.3*  MCV 96.1   < > 94.5 94.9 94.1 94.4 93.3  PLT 240   < > 219 227 215 245 295   < > = values in this interval not displayed.   Basic Metabolic Panel: Recent Labs  Lab 04/17/23 2017 04/18/23 0902 04/19/23 0402 04/20/23 0448 04/21/23 0316  NA 136 136 138 138 138  K 5.2* 5.0 6.1* 5.8* 5.5*  CL 108 108 109 112* 112*  CO2 20* 18* 18* 21* 18*  GLUCOSE 129* 115* 208* 182* 152*  BUN 69* 64* 70* 77* 78*  CREATININE 3.24* 2.96* 2.94* 2.53* 2.52*  CALCIUM 7.9* 8.0* 7.8* 8.1* 8.4*  PHOS  --   --   --  2.6  --    Liver Function Tests: Recent Labs  Lab 04/17/23 2017 04/20/23 0448  AST 17  --   ALT 9  --   ALKPHOS 89  --   BILITOT 0.9  --   PROT 6.1*  --   ALBUMIN 3.0* 2.6*   CBG: No results for input(s): "GLUCAP" in the last 168 hours.  Discharge time spent: greater than 30 minutes.  This record has been created using Conservation officer, historic buildings. Errors have been sought and corrected,but may not always be located. Such creation errors do not reflect on  the standard of care.   Signed: Arnetha Courser, MD [facility] 04/21/2023

## 2023-04-21 NOTE — TOC Initial Note (Signed)
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  [place] with [facility] is aware of admission. Patient has a wide base quad cane  PT recommending home health and no DME needs OT recommending SNF, and no DME recs documented.  Will Request that MD add OT to home health orders at discharge   Expected Discharge Plan: Home w Home Health Services Barriers to Discharge: Continued Medical Work up   Patient Goals and CMS Choice            Expected Discharge Plan and Services                                              Prior Living Arrangements/Services   Lives with:: Spouse              Current home services: DME, Home PT    Activities of Daily Living      Permission Sought/Granted                  Emotional Assessment              Admission diagnosis:  AKI (acute kidney injury) (HCC) [N17.9] Acute kidney injury (HCC) [N17.9] Altered mental status, unspecified altered mental status type [R41.82] Patient Active Problem List   Diagnosis Date Noted   Acute renal failure superimposed on stage 3b chronic kidney disease (HCC) 04/17/2023   History of melanoma 04/17/2023   S/P reverse total shoulder arthroplasty, 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 04/17/2023   Hallucinations 04/17/2023   Hyperkalemia 04/17/2023   Acute pain of left knee 04/17/2023   Delirium 04/17/2023   Acute kidney injury (HCC) 04/17/2023   CKD stage 3b, GFR 30-44 ml/min (HCC) 09/13/2022   Cervical spondylosis 10/07/2021   Hypothyroidism 07/23/2021   Low vitamin B12 level  08/09/2020   Irritability 10/18/2019   Gastroesophageal reflux disease 03/13/2019   Acute on chronic anemia 07/04/2018   Leucopenia 02/05/2018   Supraspinatus tendon tear, right, initial encounter 02/16/2017   Infraspinatus tendon tear, right, initial encounter 02/16/2017   Biceps tendon rupture, right, initial encounter 02/16/2017   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   Orthostatic hypotension 08/15/2016   Mixed hyperlipidemia 08/15/2016   Dizziness 12/30/2015   BPH (benign prostatic hyperplasia) 03/03/2015   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   Primary osteoarthritis of left knee 03/03/2015   H/O neoplasm 01/12/2015   History of nonmelanoma skin cancer 01/12/2015   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   Anemia complicating neoplastic disease 07/17/2014   Hepatosplenomegaly 07/17/2014   Neutropenia (HCC) 07/17/2014   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   PCP:  [name], DO Pharmacy:   [facility] - [place], [place] - [address] [place] [address] Phone: [phone] Fax: [phone]  [facility]) -  [place], [place] - [address] [place] [address] Phone: [phone] Fax: [phone]     Social Determinants of Health (SDOH) Social History: SDOH Screenings   Food Insecurity: No Food Insecurity (07/12/2021)  Housing: Low Risk  (07/12/2021)  Transportation Needs: No Transportation Needs (07/12/2021)  Alcohol Screen: Low Risk  (07/12/2021)  Depression (PHQ2-9): Low Risk  (04/04/2023)  Financial Resource Strain: Low Risk  (07/12/2021)  Physical Activity: Inactive (07/12/2021)  Social Connections: Socially Integrated (07/12/2021)  Stress: No Stress Concern Present (07/12/2021)  Tobacco Use: Medium Risk (04/17/2023)   SDOH Interventions:      Readmission Risk Interventions    04/21/2023   10:16 AM  Readmission Risk Prevention Plan  Transportation Screening Complete  HRI or Home Care Consult Complete  Social Work Consult for Recovery Care Planning/Counseling Complete  Palliative Care Screening Not Applicable  Medication Review Oceanographer) Complete

## 2023-04-21 NOTE — Progress Notes (Signed)
Mobility Specialist - Progress Note   04/21/23 1049  Mobility  Activity Ambulated with assistance in room;Transferred from bed to chair  Level of Assistance Standby assist, set-up cues, supervision of patient - no hands on  Assistive Device Cane (quad)  Distance Ambulated (ft) 4 ft  Activity Response Tolerated well  $Mobility charge 1 Mobility  Mobility Specialist Start Time (ACUTE ONLY) 1040  Mobility Specialist Stop Time (ACUTE ONLY) 1047  Mobility Specialist Time Calculation (min) (ACUTE ONLY) 7 min   MS responding to Pt bed alarm. Pt seated EOB upon entry, utilizing RA. Pt requesting to get up, agreeable to transfer to recliner. Pt STS and transferred to the recliner with quad cane SBA. Pt left seated in the recliner with alarm set and needs within reach.   Zetta Bills Mobility Specialist 04/21/23 10:58 AM

## 2023-04-21 NOTE — Progress Notes (Signed)
Pt discharged home with spouse in stable condition. Discharge instructions givien. Scripts sent to pharmacy of choice. No immediate questions or concerns a this time.

## 2023-04-21 NOTE — TOC Transition Note (Signed)
Transition of Care [facility]) - CM/SW Discharge Note   Patient Details  Name: [name] MRN: [id number] Date of Birth: 1940/07/26  Transition of Care [facility]) CM/SW Contact:  Chapman Fitch, RN Phone Number: 04/21/2023, 10:58 AM   Clinical Narrative:     Per MD patient to discharge today.  [place] with [facility] notified     Barriers to Discharge: Continued Medical Work up   Patient Goals and CMS Choice      Discharge Placement                         Discharge Plan and Services Additional resources added to the After Visit Summary for                                       Social Determinants of Health (SDOH) Interventions SDOH Screenings   Food Insecurity: No Food Insecurity (07/12/2021)  Housing: Low Risk  (07/12/2021)  Transportation Needs: No Transportation Needs (07/12/2021)  Alcohol Screen: Low Risk  (07/12/2021)  Depression (PHQ2-9): Low Risk  (04/04/2023)  Financial Resource Strain: Low Risk  (07/12/2021)  Physical Activity: Inactive (07/12/2021)  Social Connections: Socially Integrated (07/12/2021)  Stress: No Stress Concern Present (07/12/2021)  Tobacco Use: Medium Risk (04/17/2023)     Readmission Risk Interventions    04/21/2023   10:16 AM  Readmission Risk Prevention Plan  Transportation Screening Complete  HRI or Home Care Consult Complete  Social Work Consult for Recovery Care Planning/Counseling Complete  Palliative Care Screening Not Applicable  Medication Review Oceanographer) Complete

## 2023-04-21 NOTE — Care Management Important Message (Signed)
Important Message  Patient Details  Name: [name] MRN: [id number] Date of Birth: February 28, 1941   Important Message Given:  N/A - LOS <3 / Initial given by admissions     Olegario Messier A Rochell Puett 04/21/2023, 11:01 AM

## 2023-04-22 LAB — BODY FLUID CULTURE W GRAM STAIN
Culture: NO GROWTH
Culture: NO GROWTH

## 2023-04-23 LAB — CULTURE, BLOOD (ROUTINE X 2)
Culture: NO GROWTH
Culture: NO GROWTH
Special Requests: ADEQUATE
Special Requests: ADEQUATE

## 2023-04-24 ENCOUNTER — Telehealth: Payer: Self-pay

## 2023-04-24 NOTE — Transitions of Care (Post Inpatient/ED Visit) (Signed)
   04/24/2023  Name: TREJAN SCHERR MRN: [id number] DOB: 1940/11/10  Today's TOC FU Call Status: Today's TOC FU Call Status:: Unsuccessful Call (1st Attempt) Unsuccessful Call (1st Attempt) Date: 04/24/23  Attempted to reach the patient regarding the most recent Inpatient/ED visit.  Follow Up Plan: Additional outreach attempts will be made to reach the patient to complete the Transitions of Care (Post Inpatient/ED visit) call.   Susa Loffler , BSN, RN Care Management Coordinator [facility]   [facility] [email or web address] Direct Dial: [phone]

## 2023-04-25 ENCOUNTER — Inpatient Hospital Stay: Payer: Medicare Other | Admitting: Nurse Practitioner

## 2023-04-25 ENCOUNTER — Telehealth: Payer: Self-pay

## 2023-04-25 ENCOUNTER — Telehealth: Payer: Self-pay | Admitting: Family Medicine

## 2023-04-25 NOTE — Transitions of Care (Post Inpatient/ED Visit) (Signed)
04/25/2023  Name: [name] MRN: [id number] DOB: 1941/06/05  Today's TOC FU Call Status: Today's TOC FU Call Status:: Successful TOC FU Call Completed Unsuccessful Call (1st Attempt) Date: 04/24/23 [facility] FU Call Complete Date: 04/25/23 Patient's Name and Date of Birth confirmed.  Transition Care Management Follow-up Telephone Call Date of Discharge: 04/21/23 Discharge Facility: [facility]) Type of Discharge: Inpatient Admission Primary Inpatient Discharge Diagnosis:: AKI How have you been since you were released from the hospital?: Better Any questions or concerns?: No  Items Reviewed: Did you receive and understand the discharge instructions provided?: Yes Medications obtained,verified, and reconciled?: Yes (Medications Reviewed) Any new allergies since your discharge?: No Dietary orders reviewed?: Yes Type of Diet Ordered:: Reg NAS Heart Healthy Do you have support at home?: Yes People in Home: spouse, child(ren), adult Name of Support/Comfort Primary Source: Spouse [name], Son Danny  Medications Reviewed Today: Medications Reviewed Today     Reviewed by Johnnette Barrios, RN (Registered Nurse) on 04/25/23 at 1251  Med List Status: <None>   Medication Order Taking? Sig Documenting Provider Last Dose Status Informant  acetaminophen (TYLENOL) 500 MG tablet [id number] Yes Take 1,000 mg by mouth every 6 (six) hours as needed for mild pain (pain score 1-3). [provider] Taking Active Self, Spouse/Significant Other  amLODipine (NORVASC) 10 MG tablet [id number]  Take 1 tablet (10 mg total) by mouth every evening. Laural Benes, Megan P, DO  Active Spouse/Significant Other  azelastine (ASTELIN) 0.1 % nasal spray [id number] Yes Place 1 spray into both nostrils 2 (two) times daily. Use in each nostril as directed Particia Nearing, PA-C Taking Active Spouse/Significant Other           Med Note Raquel Sarna Apr 06, 2023 12:23 PM) On hand   Calcium Citrate-Vitamin D (CITRACAL + D PO) [id number] Yes Take 1 tablet by mouth in the morning. [provider] Taking Active Spouse/Significant Other  Cholecalciferol (D3-1000 PO) [id number] Yes Take 1,000 Units by mouth in the morning. [provider] Taking Active Spouse/Significant Other  clopidogrel (PLAVIX) 75 MG tablet [id number] Yes TAKE ONE TABLET BY MOUTH EVERY DAY Johnson, Megan P, DO Taking Active Spouse/Significant Other  dapagliflozin propanediol (FARXIGA) 10 MG TABS tablet [id number] Yes Take 10 mg by mouth. [provider] Taking Active Spouse/Significant Other  DULoxetine (CYMBALTA) 20 MG capsule [id number] Yes Take 1 capsule (20 mg total) by mouth at bedtime. TAKE 1 CAPSULE BY MOUTH EVERY DAY Poggi, Excell Seltzer, MD Taking Active Spouse/Significant Other  Fe Fum-Vit C-Vit B12-FA (TRIGELS-F FORTE) CAPS capsule [id number] No Take 1 capsule by mouth 2 (two) times daily.  Patient not taking: Reported on 04/25/2023   Arnetha Courser, MD Not Taking Active            Med Note Sharon Seller, Laneice Meneely L   Tue Apr 25, 2023 12:48 PM) Was not available at pharmacy, will try alternative pharmacy  gabapentin (NEURONTIN) 300 MG capsule [id number] Yes Take 600 mg by mouth 3 (three) times daily. [provider] Taking Active Spouse/Significant Other  hydrALAZINE (APRESOLINE) 25 MG tablet [id number] Yes Take 1 tablet (25 mg total) by mouth every 8 (eight) hours. Arnetha Courser, MD Taking Active   ketoconazole (NIZORAL) 2 % cream [id number] Yes Apply twice daily to affected area on buttocks until clear Deirdre Evener, MD Taking Active Spouse/Significant Other  levothyroxine (SYNTHROID) 25 MCG tablet [id number] Yes TAKE 1 TABLET EVERY DAY ON EMPTY STOMACHWITH A GLASS OF WATER AT LEAST  30-60 MINBEFORE BREAKFAST Johnson, Megan P, DO Taking Active Spouse/Significant Other  lidocaine (LIDODERM) 5 % [id number] Yes Place 1 patch onto the skin daily. Remove & Discard patch within 12 hours or as  directed by MD  Patient taking differently: Place 1 patch onto the skin daily as needed. Remove & Discard patch within 12 hours or as directed by MD   [name], DO Taking Active Spouse/Significant Other  meclizine (ANTIVERT) 25 MG tablet [id number] Yes TAKE 1 TABLET BY MOUTH THREE TIMES DAILYAS NEEDED FOR DIZZINESS Cannady, Dorie Rank, NP Taking Active Spouse/Significant Other  Multiple Vitamin (MULITIVITAMIN WITH MINERALS) TABS [id number] Yes Take 1 tablet by mouth in the morning. [provider] Taking Active Spouse/Significant Other  Naproxen Sodium 220 MG CAPS [id number] Yes Take 440 mg by mouth daily as needed (pain). [provider] Taking Active Spouse/Significant Other  Nutritional Supplements [facility]) LIQD [id number]  Take 1 Can by mouth 2 (two) times daily. Arnetha Courser, MD  Active   omeprazole (PRILOSEC) 40 MG capsule [id number] Yes Take 1 capsule (40 mg total) by mouth at bedtime. Poggi, Excell Seltzer, MD Taking Active Spouse/Significant Other  oxyCODONE (ROXICODONE) 5 MG immediate release tablet [id number] Yes Take 1-2 tablets (5-10 mg total) by mouth every 4 (four) hours as needed for moderate pain (pain score 4-6) or severe pain (pain score 7-10). Poggi, Excell Seltzer, MD Taking Active Spouse/Significant Other           Med Note Sharon Seller, Khadijah Mastrianni L   Tue Apr 25, 2023 12:49 PM) Rarely uses  simvastatin (ZOCOR) 40 MG tablet [id number] Yes Take 1 tablet (40 mg total) by mouth daily. Olevia Perches P, DO Taking Active Spouse/Significant Other  sodium bicarbonate 650 MG tablet [id number] Yes Take 2 tablets (1,300 mg total) by mouth 2 (two) times daily. Arnetha Courser, MD Taking Active   tadalafil (CIALIS) 5 MG tablet [id number] Yes Take 1 tablet (5 mg total) by mouth daily.  Patient taking differently: Take 5 mg by mouth at bedtime.   Olevia Perches P, DO Taking Active Spouse/Significant Other  tamsulosin (FLOMAX) 0.4 MG CAPS capsule [id number] Yes Take 2 capsules (0.8 mg total) by mouth every  evening. Poggi, Excell Seltzer, MD Taking Active Spouse/Significant Other  terbinafine (LAMISIL) 250 MG tablet [id number] Yes Take 1 tablet (250 mg total) by mouth every evening. Poggi, Excell Seltzer, MD Taking Active Spouse/Significant Other  zinc gluconate 50 MG tablet [id number] Yes Take 50 mg by mouth in the morning. [provider] Taking Active Spouse/Significant Other            Home Care and Equipment/Supplies: Were Home Health Services Ordered?: Yes Name of Home Health Agency:: Per patinet's wife yes no notation of this , Called PCP office to request  a refereal for [facility] Has Agency set up a time to come to your home?: No EMR reviewed for Home Health Orders: Home Health Not Ordered (Per patinet's wife yes no notation of this , Called PCP office to request a refereal for [facility] Services) Any new equipment or medical supplies ordered?: No  Functional Questionnaire: Do you need assistance with bathing/showering or dressing?: No Do you need assistance with meal preparation?: No Do you need assistance with eating?: No Do you have difficulty maintaining continence: No Do you need assistance with getting out of bed/getting out of a chair/moving?: No (uses cane due to knee pain) Do you have difficulty managing or taking your medications?: No  Follow up appointments reviewed: PCP Follow-up appointment confirmed?: Yes  Date of PCP follow-up appointment?: 04/25/23 Follow-up Provider: Olevia Perches- he cancelled and will rwschedule Specialist Hospital Follow-up appointment confirmed?: Yes Date of Specialist follow-up appointment?: 04/28/23 Follow-Up Specialty Provider:: 12/7  Urology/ Nephrology and 11/22 Orthopedics Do you need transportation to your follow-up appointment?: No (Both he and spouse drive)  SDOH Interventions Today    Flowsheet Row Most Recent Value  SDOH Interventions   Food Insecurity Interventions Intervention Not Indicated  Housing Interventions Intervention Not  Indicated  Transportation Interventions Intervention Not Indicated, Patient Resources (Friends/Family)  Utilities Interventions Intervention Not Indicated          Discussed VBCI  TOC program and weekly calls to patient to assess condition/status, medication management  and provide support/education as indicated . Patient/ Caregiver voiced understanding and declined enrollment in the 30-day TOC Program.   Spouse felt this discharge was managed She did request follow-up on [facility] Orders Call placed to PCP requested HH PT and OT be ordered  Spoke with Alandra  They have no preference on Provider . PCP will notify patient of [facility] Provider    The patient has been provided with contact information for the care management team and has been advised to call with any health related questions or concerns.   Susa Loffler , BSN, RN Care Management Coordinator [place]   [facility] [email or web address] Direct Dial: [phone]

## 2023-04-25 NOTE — Telephone Encounter (Signed)
Patient has an appointment scheduled for 05/02/23

## 2023-04-25 NOTE — Telephone Encounter (Signed)
Copied from CRM [id number]. Topic: General - Inquiry >> Apr 25, 2023  1:23 PM De Blanch wrote: Reason for CRM: Crystal Case Manager with [facility] stated pt was discharged from hospital 04/21/2023. Pt and wife request to follow-up on HH orders. They stated they were advised someone would be calling. Crystal stated she does not see any orders.  Asking if PCP can send a referral for [facility] PT or OT.  No preference on provider.

## 2023-04-25 NOTE — Telephone Encounter (Signed)
Needs appt. No-showed hospital follow up today

## 2023-04-25 NOTE — Progress Notes (Deleted)
There were no vitals taken for this visit.   Subjective:    Patient ID: [name], male    DOB: 12-14-1940, 82 y.o.   MRN: [id number]  HPI: [name] is a 82 y.o. male  No chief complaint on file.  Transition of Care Hospital Follow up.   Hospital/Facility: D/C Physician:  D/C Date:   Records Requested:  Records Received:  Records Reviewed:   Diagnoses on Discharge:   Hallucinations Suspect delirium Presents with auditory and tactile hallucinations, starting 1 day postop Multifactorial related to clinical dehydration with AKI, pain, pain meds, recent surgery CT head nonacute Delirium precautions and fall precautions   Acute renal failure superimposed on stage 3b chronic kidney disease (HCC) Hyperkalemia Creatinine 3.24, up from baseline of 1.91 a couple months prior with bicarb 20 and potassium 5.2 Hydrate with 1 L bolus Encourage oral rehydration Monitor renal function and avoid nephrotoxins   Acute pain of left knee Primary osteoarthritis of the left knee Patient felt a pop in the knee the day prior and now has pain X-ray showing osteoarthritis, no significant effusion otherwise nonacute Follow-up synovial fluid studies PT OT eval-recommended home health which was ordered Orthopedic consult     Acute on chronic anemia hemoglobin 7.0>>7.5, down from 9.4 a couple months prior, Receives erythropoietin by oncology S/p 1 unit of PRBC-also started on supplement and hemoglobin was 8.3 on the day of discharge   S/P reverse total shoulder arthroplasty, 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    amLODipine (NORVASC) 10 MG tablet Take 1 tablet (10 mg total) by mouth every evening.    azelastine (ASTELIN) 0.1 % nasal spray Place 1 spray into both nostrils 2 (two) times daily. Use in each nostril as directed 04/06/2023: On hand   Calcium Citrate-Vitamin D (CITRACAL + D PO) Take 1 tablet by mouth in the morning.    Cholecalciferol (D3-1000 PO) Take 1,000 Units by mouth in the morning.    clopidogrel (PLAVIX) 75 MG tablet TAKE ONE TABLET BY MOUTH EVERY DAY    dapagliflozin propanediol (FARXIGA) 10 MG TABS tablet Take 10 mg by mouth.    DULoxetine (CYMBALTA) 20 MG capsule Take 1 capsule (20 mg total) by mouth at bedtime. TAKE 1 CAPSULE BY MOUTH EVERY DAY    Fe Fum-Vit C-Vit B12-FA (TRIGELS-F FORTE) CAPS capsule Take 1 capsule by mouth 2 (two) times daily.    gabapentin (NEURONTIN) 300 MG capsule Take 600 mg by mouth 3 (three) times daily. (Patient not taking: Reported on 04/19/2023)    hydrALAZINE (APRESOLINE) 25 MG tablet Take 1 tablet (25 mg total) by mouth every 8 (eight)  hours.    ketoconazole (NIZORAL) 2 % cream Apply twice daily to affected area on buttocks until clear    levothyroxine (SYNTHROID) 25 MCG tablet TAKE 1 TABLET EVERY DAY ON EMPTY STOMACHWITH A GLASS OF WATER AT LEAST 30-60 MINBEFORE BREAKFAST    lidocaine (LIDODERM) 5 % Place 1 patch onto the skin daily. Remove & Discard patch within 12 hours or as directed by MD (Patient taking  differently: Place 1 patch onto the skin daily as needed. Remove & Discard patch within 12 hours or as directed by MD)    meclizine (ANTIVERT) 25 MG tablet TAKE 1 TABLET BY MOUTH THREE TIMES DAILYAS NEEDED FOR DIZZINESS    Multiple Vitamin (MULITIVITAMIN WITH MINERALS) TABS Take 1 tablet by mouth in the morning.    Naproxen Sodium 220 MG CAPS Take 440 mg by mouth daily as needed (pain).    Nutritional Supplements (NEPRO) LIQD Take 1 Can by mouth 2 (two) times daily.    omeprazole (PRILOSEC) 40 MG capsule Take 1 capsule (40 mg total) by mouth at bedtime.    oxyCODONE (ROXICODONE) 5 MG immediate release tablet Take 1-2 tablets (5-10 mg total) by mouth every 4 (four) hours as needed for moderate pain (pain score 4-6) or severe pain (pain score 7-10).    simvastatin (ZOCOR) 40 MG tablet Take 1 tablet (40 mg total) by mouth daily.    sodium bicarbonate 650 MG tablet Take 2 tablets (1,300 mg total) by mouth 2 (two) times daily.    tadalafil (CIALIS) 5 MG tablet Take 1 tablet (5 mg total) by mouth daily. (Patient taking differently: Take 5 mg by mouth at bedtime.)    tamsulosin (FLOMAX) 0.4 MG CAPS capsule Take 2 capsules (0.8 mg total) by mouth every evening.    terbinafine (LAMISIL) 250 MG tablet Take 1 tablet (250 mg total) by mouth every evening.    zinc gluconate 50 MG tablet Take 50 mg by mouth in the morning.    Facility-Administered Encounter Medications as of 04/25/2023  Medication   epoetin alfa-epbx (RETACRIT) injection 10,000 Units   epoetin alfa-epbx (RETACRIT) injection 40,000 Units    Diagnostic Tests Reviewed/Disposition:   Consults:  Discharge Instructions  Disease/illness Education:  Home Health/Community Services Discussions/Referrals:  Establishment or re-establishment of referral orders for community resources:  Discussion with other health care providers:  Assessment and Support of treatment regimen adherence:  Appointments Coordinated with:   Education for  self-management, independent living, and ADLs:   Relevant past medical, surgical, family and social history reviewed and updated as indicated. Interim medical history since our last visit reviewed. Allergies and medications reviewed and updated.  Review of Systems  Per HPI unless specifically indicated above     Objective:    There were no vitals taken for this visit.  Wt Readings from Last 3 Encounters:  04/21/23 191 lb 9.3 oz (86.9 kg)  04/13/23 182 lb 8.7 oz (82.8 kg)  04/10/23 182 lb 8.7 oz (82.8 kg)    Physical Exam  Results for orders placed or performed during the hospital encounter of 04/17/23  Body fluid culture w Gram Stain   Specimen: Body Fluid  Result Value Ref Range   Specimen Description      FLUID Performed at [facility], [address], [place], [place] [address]    Special Requests      KNEE Performed at [facility], [address] [place], [place] [address]    Gram Stain      ABUNDANT WBC  PRESENT, PREDOMINANTLY PMN NO ORGANISMS SEEN    Culture      NO GROWTH 3 DAYS Performed at [facility], [address] [place], [place] [address]    Report Status 04/22/2023 FINAL   Culture, blood (Routine X 2) w Reflex to ID Panel   Specimen: BLOOD  Result Value Ref Range   Specimen Description BLOOD LEFT ARM    Special Requests      BOTTLES DRAWN AEROBIC AND ANAEROBIC Blood Culture adequate volume   Culture      NO GROWTH 5 DAYS Performed at [facility], [address] [place], [place] [address]    Report Status 04/23/2023 FINAL   Culture, blood (Routine X 2) w Reflex to ID Panel   Specimen: BLOOD  Result Value Ref Range   Specimen Description BLOOD LEFT HAND    Special Requests      BOTTLES DRAWN AEROBIC AND ANAEROBIC Blood Culture adequate volume   Culture      NO GROWTH 5 DAYS Performed at [facility], [address], [place], [place] [address]    Report Status 04/23/2023 FINAL   Body fluid  culture w Gram Stain   Specimen: Path fluid; Body Fluid  Result Value Ref Range   Specimen Description      FLUID Performed at [facility], [address] [place], [place] [address]    Special Requests      JOINT KNEE Performed at [facility], [address] [place], [place] [address]    Gram Stain      MODERATE WBC PRESENT, PREDOMINANTLY PMN NO ORGANISMS SEEN    Culture      NO GROWTH 3 DAYS Performed at [facility], [address] [place], [place] [address]    Report Status 04/22/2023 FINAL   Urinalysis, Routine w reflex microscopic -Urine, Clean Catch  Result Value Ref Range   Color, Urine YELLOW (A) YELLOW   APPearance CLOUDY (A) CLEAR   Specific Gravity, Urine 1.018 1.005 - 1.030   pH 5.0 5.0 - 8.0   Glucose, UA 50 (A) NEGATIVE mg/dL   Hgb urine dipstick NEGATIVE NEGATIVE   Bilirubin Urine NEGATIVE NEGATIVE   Ketones, ur NEGATIVE NEGATIVE mg/dL   Protein, ur >=962 (A) NEGATIVE mg/dL   Nitrite NEGATIVE NEGATIVE   Leukocytes,Ua NEGATIVE NEGATIVE   RBC / HPF 0-5 0 - 5 RBC/hpf   WBC, UA 0-5 0 - 5 WBC/hpf   Bacteria, UA RARE (A) NONE SEEN   Squamous Epithelial / HPF 0-5 0 - 5 /HPF  Comprehensive metabolic panel  Result Value Ref Range   Sodium 136 135 - 145 mmol/L   Potassium 5.2 (H) 3.5 - 5.1 mmol/L   Chloride 108 98 - 111 mmol/L   CO2 20 (L) 22 - 32 mmol/L   Glucose, Bld 129 (H) 70 - 99 mg/dL   BUN 69 (H) 8 - 23 mg/dL   Creatinine, Ser 9.52 (H) 0.61 - 1.24 mg/dL   Calcium 7.9 (L) 8.9 - 10.3 mg/dL   Total Protein 6.1 (L) 6.5 - 8.1 g/dL   Albumin 3.0 (L) 3.5 - 5.0 g/dL   AST 17 15 - 41 U/L   ALT 9 0 - 44 U/L   Alkaline Phosphatase 89 38 - 126 U/L   Total Bilirubin 0.9 <1.2 mg/dL   GFR, Estimated 18 (L) >60 mL/min   Anion gap 8 5 - 15  CBC with Differential  Result Value Ref Range  WBC 8.6 4.0 - 10.5 K/uL   RBC 2.55 (L) 4.22 - 5.81 MIL/uL   Hemoglobin 7.4 (L) 13.0 - 17.0 g/dL   HCT 09.8 (L) 11.9 - 14.7 %   MCV 96.1 80.0 -  100.0 fL   MCH 29.0 26.0 - 34.0 pg   MCHC 30.2 30.0 - 36.0 g/dL   RDW 82.9 (H) 56.2 - 13.0 %   Platelets 240 150 - 400 K/uL   nRBC 0.7 (H) 0.0 - 0.2 %   Neutrophils Relative % 73 %   Neutro Abs 6.2 1.7 - 7.7 K/uL   Lymphocytes Relative 6 %   Lymphs Abs 0.5 (L) 0.7 - 4.0 K/uL   Monocytes Relative 16 %   Monocytes Absolute 1.4 (H) 0.1 - 1.0 K/uL   Eosinophils Relative 0 %   Eosinophils Absolute 0.0 0.0 - 0.5 K/uL   Basophils Relative 0 %   Basophils Absolute 0.0 0.0 - 0.1 K/uL   WBC Morphology Mild Left Shift (1-5% metas, occ myelo)    Smear Review Normal platelet morphology    Immature Granulocytes 5 %   Abs Immature Granulocytes 0.46 (H) 0.00 - 0.07 K/uL   Tear Drop Cells PRESENT    Ovalocytes PRESENT   Ammonia  Result Value Ref Range   Ammonia 18 9 - 35 umol/L  Glucose, Body Fluid Other  Result Value Ref Range   Glucose, Body Fluid Other 15 mg/dL   Source of Sample FLUID   Protein, body fluid (other)  Result Value Ref Range   Total Protein, Body Fluid Other 3.5 g/dL   Source of Sample FLUID   Synovial cell count + diff, w/ crystals  Result Value Ref Range   Color, Synovial YELLOW (A) YELLOW   Appearance-Synovial CLOUDY (A) CLEAR   Crystals, Fluid INTRACELLULAR CALCIUM PYROPHOSPHATE CRYSTALS    WBC, Synovial 39,604 (H) 0 - 200 /cu mm   Neutrophil, Synovial 90 %   Lymphocytes-Synovial Fld 4 %   Monocyte-Macrophage-Synovial Fluid 6 %   Eosinophils-Synovial 0 %  Uric Acid, Body Fluid  Result Value Ref Range   Uric Acid Body Fluid 7.8 mg/dL   Source of Sample FLUID   CBC  Result Value Ref Range   WBC 6.2 4.0 - 10.5 K/uL   RBC 2.39 (L) 4.22 - 5.81 MIL/uL   Hemoglobin 7.0 (L) 13.0 - 17.0 g/dL   HCT 86.5 (L) 78.4 - 69.6 %   MCV 96.2 80.0 - 100.0 fL   MCH 29.3 26.0 - 34.0 pg   MCHC 30.4 30.0 - 36.0 g/dL   RDW 29.5 (H) 28.4 - 13.2 %   Platelets 207 150 - 400 K/uL   nRBC 1.0 (H) 0.0 - 0.2 %  CBC  Result Value Ref Range   WBC 5.7 4.0 - 10.5 K/uL   RBC 2.37 (L) 4.22  - 5.81 MIL/uL   Hemoglobin 7.0 (L) 13.0 - 17.0 g/dL   HCT 44.0 (L) 10.2 - 72.5 %   MCV 94.5 80.0 - 100.0 fL   MCH 29.5 26.0 - 34.0 pg   MCHC 31.3 30.0 - 36.0 g/dL   RDW 36.6 (H) 44.0 - 34.7 %   Platelets 219 150 - 400 K/uL   nRBC 0.7 (H) 0.0 - 0.2 %  CBC  Result Value Ref Range   WBC 6.6 4.0 - 10.5 K/uL   RBC 2.36 (L) 4.22 - 5.81 MIL/uL   Hemoglobin 7.0 (L) 13.0 - 17.0 g/dL   HCT 42.5 (L) 95.6 - 38.7 %  MCV 94.9 80.0 - 100.0 fL   MCH 29.7 26.0 - 34.0 pg   MCHC 31.3 30.0 - 36.0 g/dL   RDW 52.8 (H) 41.3 - 24.4 %   Platelets 227 150 - 400 K/uL   nRBC 1.1 (H) 0.0 - 0.2 %  Basic metabolic panel  Result Value Ref Range   Sodium 136 135 - 145 mmol/L   Potassium 5.0 3.5 - 5.1 mmol/L   Chloride 108 98 - 111 mmol/L   CO2 18 (L) 22 - 32 mmol/L   Glucose, Bld 115 (H) 70 - 99 mg/dL   BUN 64 (H) 8 - 23 mg/dL   Creatinine, Ser 0.10 (H) 0.61 - 1.24 mg/dL   Calcium 8.0 (L) 8.9 - 10.3 mg/dL   GFR, Estimated 20 (L) >60 mL/min   Anion gap 10 5 - 15  Lactic acid, plasma  Result Value Ref Range   Lactic Acid, Venous 0.6 0.5 - 1.9 mmol/L  Lactic acid, plasma  Result Value Ref Range   Lactic Acid, Venous 0.5 0.5 - 1.9 mmol/L  Uric acid  Result Value Ref Range   Uric Acid, Serum 8.8 (H) 3.7 - 8.6 mg/dL  Synovial cell count + diff, w/ crystals  Result Value Ref Range   Color, Synovial YELLOW (A) YELLOW   Appearance-Synovial CLOUDY (A) CLEAR   Crystals, Fluid EXTRACELLULAR CALCIUM PYROPHOSPHATE CRYSTALS    WBC, Synovial 28,580 (H) 0 - 200 /cu mm   Neutrophil, Synovial 94 %   Lymphocytes-Synovial Fld 0 %   Monocyte-Macrophage-Synovial Fluid 6 %   Eosinophils-Synovial 0 %  CBC with Differential/Platelet  Result Value Ref Range   WBC 6.9 4.0 - 10.5 K/uL   RBC 2.55 (L) 4.22 - 5.81 MIL/uL   Hemoglobin 7.5 (L) 13.0 - 17.0 g/dL   HCT 27.2 (L) 53.6 - 64.4 %   MCV 94.1 80.0 - 100.0 fL   MCH 29.4 26.0 - 34.0 pg   MCHC 31.3 30.0 - 36.0 g/dL   RDW 03.4 (H) 74.2 - 59.5 %   Platelets 215 150 -  400 K/uL   nRBC 0.4 (H) 0.0 - 0.2 %   Neutrophils Relative % 71 %   Neutro Abs 5.0 1.7 - 7.7 K/uL   Lymphocytes Relative 5 %   Lymphs Abs 0.3 (L) 0.7 - 4.0 K/uL   Monocytes Relative 11 %   Monocytes Absolute 0.7 0.1 - 1.0 K/uL   Eosinophils Relative 0 %   Eosinophils Absolute 0.0 0.0 - 0.5 K/uL   Basophils Relative 1 %   Basophils Absolute 0.1 0.0 - 0.1 K/uL   WBC Morphology Mild Left Shift (1-5% metas, occ myelo)    RBC Morphology See Note    Smear Review Normal platelet morphology    Immature Granulocytes 12 %   Abs Immature Granulocytes 0.79 (H) 0.00 - 0.07 K/uL   Polychromasia PRESENT   Basic metabolic panel  Result Value Ref Range   Sodium 138 135 - 145 mmol/L   Potassium 6.1 (H) 3.5 - 5.1 mmol/L   Chloride 109 98 - 111 mmol/L   CO2 18 (L) 22 - 32 mmol/L   Glucose, Bld 208 (H) 70 - 99 mg/dL   BUN 70 (H) 8 - 23 mg/dL   Creatinine, Ser 6.38 (H) 0.61 - 1.24 mg/dL   Calcium 7.8 (L) 8.9 - 10.3 mg/dL   GFR, Estimated 21 (L) >60 mL/min   Anion gap 11 5 - 15  CBC  Result Value Ref Range   WBC 9.0 4.0 -  10.5 K/uL   RBC 2.67 (L) 4.22 - 5.81 MIL/uL   Hemoglobin 7.8 (L) 13.0 - 17.0 g/dL   HCT 11.9 (L) 14.7 - 82.9 %   MCV 94.4 80.0 - 100.0 fL   MCH 29.2 26.0 - 34.0 pg   MCHC 31.0 30.0 - 36.0 g/dL   RDW 56.2 (H) 13.0 - 86.5 %   Platelets 245 150 - 400 K/uL   nRBC 0.9 (H) 0.0 - 0.2 %  Renal function panel  Result Value Ref Range   Sodium 138 135 - 145 mmol/L   Potassium 5.8 (H) 3.5 - 5.1 mmol/L   Chloride 112 (H) 98 - 111 mmol/L   CO2 21 (L) 22 - 32 mmol/L   Glucose, Bld 182 (H) 70 - 99 mg/dL   BUN 77 (H) 8 - 23 mg/dL   Creatinine, Ser 7.84 (H) 0.61 - 1.24 mg/dL   Calcium 8.1 (L) 8.9 - 10.3 mg/dL   Phosphorus 2.6 2.5 - 4.6 mg/dL   Albumin 2.6 (L) 3.5 - 5.0 g/dL   GFR, Estimated 25 (L) >60 mL/min   Anion gap 5 5 - 15  CBC  Result Value Ref Range   WBC 10.9 (H) 4.0 - 10.5 K/uL   RBC 2.82 (L) 4.22 - 5.81 MIL/uL   Hemoglobin 8.3 (L) 13.0 - 17.0 g/dL   HCT 69.6 (L) 29.5  - 52.0 %   MCV 93.3 80.0 - 100.0 fL   MCH 29.4 26.0 - 34.0 pg   MCHC 31.6 30.0 - 36.0 g/dL   RDW 28.4 (H) 13.2 - 44.0 %   Platelets 295 150 - 400 K/uL   nRBC 1.0 (H) 0.0 - 0.2 %  Basic metabolic panel  Result Value Ref Range   Sodium 138 135 - 145 mmol/L   Potassium 5.5 (H) 3.5 - 5.1 mmol/L   Chloride 112 (H) 98 - 111 mmol/L   CO2 18 (L) 22 - 32 mmol/L   Glucose, Bld 152 (H) 70 - 99 mg/dL   BUN 78 (H) 8 - 23 mg/dL   Creatinine, Ser 1.02 (H) 0.61 - 1.24 mg/dL   Calcium 8.4 (L) 8.9 - 10.3 mg/dL   GFR, Estimated 25 (L) >60 mL/min   Anion gap 8 5 - 15  Prepare RBC (crossmatch)  Result Value Ref Range   Order Confirmation      ORDER PROCESSED BY BLOOD BANK Performed at [facility], [address] [place], [place] [address]   Type and screen [facility]  Result Value Ref Range   ABO/RH(D) O POS    Antibody Screen NEG    Sample Expiration 04/21/2023,2359    Unit Number [id number]    Blood Component Type RBC LR PHER1    Unit division 00    Status of Unit ISSUED,FINAL    Transfusion Status OK TO TRANSFUSE    Crossmatch Result      Compatible Performed at [facility], [address], [place], [place] [address]   ABO/Rh  Result Value Ref Range   ABO/RH(D)      O POS Performed at [facility], [address], [place], [place] [address]   BPAM [facility]  Result Value Ref Range   ISSUE DATE / TIME [phone]    Blood Product Unit Number [id number]    PRODUCT CODE [id number]    Unit Type and Rh 5100    Blood Product Expiration Date 202412122359       Assessment & Plan:   Problem List  Items Addressed This Visit   None    Follow up plan: No follow-ups on file.

## 2023-04-25 NOTE — Telephone Encounter (Signed)
Routing to provider to advise.  

## 2023-04-26 ENCOUNTER — Inpatient Hospital Stay: Payer: Medicare Other

## 2023-04-26 VITALS — BP 152/50 | HR 66

## 2023-04-26 DIAGNOSIS — N184 Chronic kidney disease, stage 4 (severe): Secondary | ICD-10-CM | POA: Diagnosis not present

## 2023-04-26 DIAGNOSIS — I129 Hypertensive chronic kidney disease with stage 1 through stage 4 chronic kidney disease, or unspecified chronic kidney disease: Secondary | ICD-10-CM | POA: Diagnosis not present

## 2023-04-26 DIAGNOSIS — D631 Anemia in chronic kidney disease: Secondary | ICD-10-CM | POA: Diagnosis not present

## 2023-04-26 DIAGNOSIS — N183 Chronic kidney disease, stage 3 unspecified: Secondary | ICD-10-CM

## 2023-04-26 DIAGNOSIS — D7581 Myelofibrosis: Secondary | ICD-10-CM

## 2023-04-26 LAB — HEMOGLOBIN AND HEMATOCRIT (CANCER CENTER ONLY)
HCT: 25.9 % — ABNORMAL LOW (ref 39.0–52.0)
Hemoglobin: 7.8 g/dL — ABNORMAL LOW (ref 13.0–17.0)

## 2023-04-26 MED ORDER — EPOETIN ALFA-EPBX 10000 UNIT/ML IJ SOLN
10000.0000 [IU] | Freq: Once | INTRAMUSCULAR | Status: AC
  Administered 2023-04-26: 10000 [IU] via SUBCUTANEOUS
  Filled 2023-04-26: qty 1

## 2023-04-26 MED ORDER — EPOETIN ALFA-EPBX 40000 UNIT/ML IJ SOLN
40000.0000 [IU] | Freq: Once | INTRAMUSCULAR | Status: AC
Start: 2023-04-26 — End: 2023-04-26
  Administered 2023-04-26: 40000 [IU] via SUBCUTANEOUS
  Filled 2023-04-26: qty 1

## 2023-04-29 DIAGNOSIS — E119 Type 2 diabetes mellitus without complications: Secondary | ICD-10-CM | POA: Diagnosis not present

## 2023-04-29 DIAGNOSIS — Z471 Aftercare following joint replacement surgery: Secondary | ICD-10-CM | POA: Diagnosis not present

## 2023-04-29 DIAGNOSIS — D471 Chronic myeloproliferative disease: Secondary | ICD-10-CM | POA: Diagnosis not present

## 2023-04-29 DIAGNOSIS — M75121 Complete rotator cuff tear or rupture of right shoulder, not specified as traumatic: Secondary | ICD-10-CM | POA: Diagnosis not present

## 2023-04-29 DIAGNOSIS — I10 Essential (primary) hypertension: Secondary | ICD-10-CM | POA: Diagnosis not present

## 2023-04-29 DIAGNOSIS — M12811 Other specific arthropathies, not elsewhere classified, right shoulder: Secondary | ICD-10-CM | POA: Diagnosis not present

## 2023-05-01 DIAGNOSIS — M75121 Complete rotator cuff tear or rupture of right shoulder, not specified as traumatic: Secondary | ICD-10-CM | POA: Diagnosis not present

## 2023-05-01 DIAGNOSIS — M12811 Other specific arthropathies, not elsewhere classified, right shoulder: Secondary | ICD-10-CM | POA: Diagnosis not present

## 2023-05-01 DIAGNOSIS — I10 Essential (primary) hypertension: Secondary | ICD-10-CM | POA: Diagnosis not present

## 2023-05-01 DIAGNOSIS — E119 Type 2 diabetes mellitus without complications: Secondary | ICD-10-CM | POA: Diagnosis not present

## 2023-05-01 DIAGNOSIS — D471 Chronic myeloproliferative disease: Secondary | ICD-10-CM | POA: Diagnosis not present

## 2023-05-01 DIAGNOSIS — Z471 Aftercare following joint replacement surgery: Secondary | ICD-10-CM | POA: Diagnosis not present

## 2023-05-02 ENCOUNTER — Inpatient Hospital Stay: Payer: Medicare Other

## 2023-05-02 ENCOUNTER — Ambulatory Visit (HOSPITAL_COMMUNITY): Admission: RE | Admit: 2023-05-02 | Payer: Medicare Other | Source: Home / Self Care | Admitting: Cardiology

## 2023-05-02 ENCOUNTER — Inpatient Hospital Stay
Admission: EM | Admit: 2023-05-02 | Discharge: 2023-05-09 | DRG: 286 | Disposition: A | Discharge status: Skilled Nursing Facility | Payer: Medicare Other | Attending: Internal Medicine | Admitting: Internal Medicine

## 2023-05-02 ENCOUNTER — Encounter: Payer: Self-pay | Admitting: Certified Registered"

## 2023-05-02 ENCOUNTER — Other Ambulatory Visit: Payer: Self-pay

## 2023-05-02 ENCOUNTER — Encounter
Admission: EM | Disposition: A | Discharge status: Skilled Nursing Facility | Payer: Self-pay | Source: Home / Self Care | Attending: Internal Medicine

## 2023-05-02 ENCOUNTER — Ambulatory Visit: Payer: Medicare Other | Admitting: Family Medicine

## 2023-05-02 ENCOUNTER — Encounter (HOSPITAL_COMMUNITY): Admission: RE | Payer: Self-pay | Source: Home / Self Care

## 2023-05-02 ENCOUNTER — Ambulatory Visit: Payer: Self-pay

## 2023-05-02 DIAGNOSIS — R000 Tachycardia, unspecified: Secondary | ICD-10-CM

## 2023-05-02 DIAGNOSIS — D631 Anemia in chronic kidney disease: Secondary | ICD-10-CM | POA: Diagnosis not present

## 2023-05-02 DIAGNOSIS — I2510 Atherosclerotic heart disease of native coronary artery without angina pectoris: Secondary | ICD-10-CM | POA: Diagnosis present

## 2023-05-02 DIAGNOSIS — I739 Peripheral vascular disease, unspecified: Secondary | ICD-10-CM | POA: Diagnosis present

## 2023-05-02 DIAGNOSIS — R571 Hypovolemic shock: Secondary | ICD-10-CM | POA: Diagnosis present

## 2023-05-02 DIAGNOSIS — N400 Enlarged prostate without lower urinary tract symptoms: Secondary | ICD-10-CM | POA: Diagnosis not present

## 2023-05-02 DIAGNOSIS — E875 Hyperkalemia: Secondary | ICD-10-CM | POA: Diagnosis present

## 2023-05-02 DIAGNOSIS — I890 Lymphedema, not elsewhere classified: Secondary | ICD-10-CM | POA: Diagnosis not present

## 2023-05-02 DIAGNOSIS — N184 Chronic kidney disease, stage 4 (severe): Secondary | ICD-10-CM | POA: Diagnosis not present

## 2023-05-02 DIAGNOSIS — I4819 Other persistent atrial fibrillation: Secondary | ICD-10-CM | POA: Diagnosis present

## 2023-05-02 DIAGNOSIS — D469 Myelodysplastic syndrome, unspecified: Secondary | ICD-10-CM | POA: Diagnosis present

## 2023-05-02 DIAGNOSIS — I272 Pulmonary hypertension, unspecified: Secondary | ICD-10-CM | POA: Diagnosis not present

## 2023-05-02 DIAGNOSIS — D471 Chronic myeloproliferative disease: Secondary | ICD-10-CM | POA: Diagnosis present

## 2023-05-02 DIAGNOSIS — N171 Acute kidney failure with acute cortical necrosis: Secondary | ICD-10-CM | POA: Diagnosis not present

## 2023-05-02 DIAGNOSIS — E569 Vitamin deficiency, unspecified: Secondary | ICD-10-CM | POA: Diagnosis not present

## 2023-05-02 DIAGNOSIS — Z87891 Personal history of nicotine dependence: Secondary | ICD-10-CM

## 2023-05-02 DIAGNOSIS — I4821 Permanent atrial fibrillation: Secondary | ICD-10-CM | POA: Diagnosis not present

## 2023-05-02 DIAGNOSIS — E872 Acidosis, unspecified: Secondary | ICD-10-CM | POA: Diagnosis present

## 2023-05-02 DIAGNOSIS — I130 Hypertensive heart and chronic kidney disease with heart failure and stage 1 through stage 4 chronic kidney disease, or unspecified chronic kidney disease: Secondary | ICD-10-CM | POA: Diagnosis present

## 2023-05-02 DIAGNOSIS — R809 Proteinuria, unspecified: Secondary | ICD-10-CM | POA: Diagnosis present

## 2023-05-02 DIAGNOSIS — I300 Acute nonspecific idiopathic pericarditis: Secondary | ICD-10-CM | POA: Diagnosis not present

## 2023-05-02 DIAGNOSIS — R590 Localized enlarged lymph nodes: Secondary | ICD-10-CM | POA: Diagnosis not present

## 2023-05-02 DIAGNOSIS — D62 Acute posthemorrhagic anemia: Secondary | ICD-10-CM | POA: Diagnosis present

## 2023-05-02 DIAGNOSIS — M25562 Pain in left knee: Secondary | ICD-10-CM | POA: Diagnosis not present

## 2023-05-02 DIAGNOSIS — I5022 Chronic systolic (congestive) heart failure: Secondary | ICD-10-CM | POA: Diagnosis present

## 2023-05-02 DIAGNOSIS — I959 Hypotension, unspecified: Secondary | ICD-10-CM | POA: Diagnosis not present

## 2023-05-02 DIAGNOSIS — R3589 Other polyuria: Secondary | ICD-10-CM | POA: Diagnosis present

## 2023-05-02 DIAGNOSIS — Z8249 Family history of ischemic heart disease and other diseases of the circulatory system: Secondary | ICD-10-CM

## 2023-05-02 DIAGNOSIS — N170 Acute kidney failure with tubular necrosis: Secondary | ICD-10-CM | POA: Diagnosis present

## 2023-05-02 DIAGNOSIS — R579 Shock, unspecified: Secondary | ICD-10-CM

## 2023-05-02 DIAGNOSIS — K219 Gastro-esophageal reflux disease without esophagitis: Secondary | ICD-10-CM | POA: Diagnosis not present

## 2023-05-02 DIAGNOSIS — E039 Hypothyroidism, unspecified: Secondary | ICD-10-CM | POA: Diagnosis not present

## 2023-05-02 DIAGNOSIS — Z23 Encounter for immunization: Secondary | ICD-10-CM

## 2023-05-02 DIAGNOSIS — I5021 Acute systolic (congestive) heart failure: Secondary | ICD-10-CM | POA: Diagnosis not present

## 2023-05-02 DIAGNOSIS — R161 Splenomegaly, not elsewhere classified: Secondary | ICD-10-CM | POA: Diagnosis not present

## 2023-05-02 DIAGNOSIS — N179 Acute kidney failure, unspecified: Secondary | ICD-10-CM | POA: Diagnosis not present

## 2023-05-02 DIAGNOSIS — N1832 Chronic kidney disease, stage 3b: Secondary | ICD-10-CM | POA: Diagnosis not present

## 2023-05-02 DIAGNOSIS — G9009 Other idiopathic peripheral autonomic neuropathy: Secondary | ICD-10-CM | POA: Diagnosis not present

## 2023-05-02 DIAGNOSIS — I3139 Other pericardial effusion (noninflammatory): Secondary | ICD-10-CM | POA: Diagnosis not present

## 2023-05-02 DIAGNOSIS — R0902 Hypoxemia: Secondary | ICD-10-CM | POA: Diagnosis not present

## 2023-05-02 DIAGNOSIS — K5730 Diverticulosis of large intestine without perforation or abscess without bleeding: Secondary | ICD-10-CM | POA: Diagnosis not present

## 2023-05-02 DIAGNOSIS — I213 ST elevation (STEMI) myocardial infarction of unspecified site: Secondary | ICD-10-CM | POA: Diagnosis not present

## 2023-05-02 DIAGNOSIS — Z85820 Personal history of malignant melanoma of skin: Secondary | ICD-10-CM

## 2023-05-02 DIAGNOSIS — Z96611 Presence of right artificial shoulder joint: Secondary | ICD-10-CM | POA: Diagnosis present

## 2023-05-02 DIAGNOSIS — H9190 Unspecified hearing loss, unspecified ear: Secondary | ICD-10-CM | POA: Diagnosis present

## 2023-05-02 DIAGNOSIS — M79601 Pain in right arm: Secondary | ICD-10-CM | POA: Diagnosis not present

## 2023-05-02 DIAGNOSIS — R0689 Other abnormalities of breathing: Secondary | ICD-10-CM | POA: Diagnosis not present

## 2023-05-02 DIAGNOSIS — I517 Cardiomegaly: Secondary | ICD-10-CM | POA: Diagnosis not present

## 2023-05-02 DIAGNOSIS — Z833 Family history of diabetes mellitus: Secondary | ICD-10-CM

## 2023-05-02 DIAGNOSIS — Z741 Need for assistance with personal care: Secondary | ICD-10-CM | POA: Diagnosis not present

## 2023-05-02 DIAGNOSIS — N281 Cyst of kidney, acquired: Secondary | ICD-10-CM | POA: Diagnosis present

## 2023-05-02 DIAGNOSIS — Z1152 Encounter for screening for COVID-19: Secondary | ICD-10-CM

## 2023-05-02 DIAGNOSIS — Z87442 Personal history of urinary calculi: Secondary | ICD-10-CM

## 2023-05-02 DIAGNOSIS — Z7902 Long term (current) use of antithrombotics/antiplatelets: Secondary | ICD-10-CM

## 2023-05-02 DIAGNOSIS — J9811 Atelectasis: Secondary | ICD-10-CM | POA: Diagnosis not present

## 2023-05-02 DIAGNOSIS — I5033 Acute on chronic diastolic (congestive) heart failure: Secondary | ICD-10-CM | POA: Diagnosis not present

## 2023-05-02 DIAGNOSIS — Z8673 Personal history of transient ischemic attack (TIA), and cerebral infarction without residual deficits: Secondary | ICD-10-CM

## 2023-05-02 DIAGNOSIS — E871 Hypo-osmolality and hyponatremia: Secondary | ICD-10-CM | POA: Diagnosis present

## 2023-05-02 DIAGNOSIS — D649 Anemia, unspecified: Secondary | ICD-10-CM | POA: Diagnosis not present

## 2023-05-02 DIAGNOSIS — J9601 Acute respiratory failure with hypoxia: Secondary | ICD-10-CM | POA: Diagnosis present

## 2023-05-02 DIAGNOSIS — R6510 Systemic inflammatory response syndrome (SIRS) of non-infectious origin without acute organ dysfunction: Secondary | ICD-10-CM | POA: Diagnosis present

## 2023-05-02 DIAGNOSIS — R079 Chest pain, unspecified: Secondary | ICD-10-CM | POA: Diagnosis not present

## 2023-05-02 DIAGNOSIS — D7581 Myelofibrosis: Secondary | ICD-10-CM | POA: Diagnosis not present

## 2023-05-02 DIAGNOSIS — E785 Hyperlipidemia, unspecified: Secondary | ICD-10-CM | POA: Diagnosis present

## 2023-05-02 DIAGNOSIS — Z85828 Personal history of other malignant neoplasm of skin: Secondary | ICD-10-CM

## 2023-05-02 DIAGNOSIS — N189 Chronic kidney disease, unspecified: Secondary | ICD-10-CM | POA: Diagnosis not present

## 2023-05-02 DIAGNOSIS — I309 Acute pericarditis, unspecified: Secondary | ICD-10-CM | POA: Diagnosis not present

## 2023-05-02 DIAGNOSIS — F419 Anxiety disorder, unspecified: Secondary | ICD-10-CM | POA: Diagnosis present

## 2023-05-02 DIAGNOSIS — E663 Overweight: Secondary | ICD-10-CM | POA: Insufficient documentation

## 2023-05-02 DIAGNOSIS — Z79890 Hormone replacement therapy (postmenopausal): Secondary | ICD-10-CM

## 2023-05-02 DIAGNOSIS — F32A Depression, unspecified: Secondary | ICD-10-CM | POA: Diagnosis present

## 2023-05-02 DIAGNOSIS — Z888 Allergy status to other drugs, medicaments and biological substances status: Secondary | ICD-10-CM

## 2023-05-02 DIAGNOSIS — I10 Essential (primary) hypertension: Secondary | ICD-10-CM | POA: Diagnosis not present

## 2023-05-02 DIAGNOSIS — R918 Other nonspecific abnormal finding of lung field: Secondary | ICD-10-CM | POA: Diagnosis not present

## 2023-05-02 DIAGNOSIS — M6259 Muscle wasting and atrophy, not elsewhere classified, multiple sites: Secondary | ICD-10-CM | POA: Diagnosis not present

## 2023-05-02 DIAGNOSIS — Z79899 Other long term (current) drug therapy: Secondary | ICD-10-CM

## 2023-05-02 DIAGNOSIS — I499 Cardiac arrhythmia, unspecified: Secondary | ICD-10-CM | POA: Diagnosis not present

## 2023-05-02 DIAGNOSIS — R2681 Unsteadiness on feet: Secondary | ICD-10-CM | POA: Diagnosis not present

## 2023-05-02 DIAGNOSIS — R42 Dizziness and giddiness: Secondary | ICD-10-CM | POA: Diagnosis not present

## 2023-05-02 DIAGNOSIS — M1990 Unspecified osteoarthritis, unspecified site: Secondary | ICD-10-CM | POA: Diagnosis not present

## 2023-05-02 DIAGNOSIS — R1084 Generalized abdominal pain: Secondary | ICD-10-CM | POA: Diagnosis not present

## 2023-05-02 DIAGNOSIS — M7989 Other specified soft tissue disorders: Secondary | ICD-10-CM | POA: Diagnosis present

## 2023-05-02 DIAGNOSIS — I4891 Unspecified atrial fibrillation: Secondary | ICD-10-CM | POA: Diagnosis not present

## 2023-05-02 DIAGNOSIS — R600 Localized edema: Secondary | ICD-10-CM | POA: Diagnosis not present

## 2023-05-02 DIAGNOSIS — Z7401 Bed confinement status: Secondary | ICD-10-CM | POA: Diagnosis not present

## 2023-05-02 HISTORY — PX: LEFT HEART CATH AND CORONARY ANGIOGRAPHY: CATH118249

## 2023-05-02 LAB — COMPREHENSIVE METABOLIC PANEL
ALT: 13 U/L (ref 0–44)
ALT: 13 U/L (ref 0–44)
AST: 13 U/L — ABNORMAL LOW (ref 15–41)
AST: 12 U/L — ABNORMAL LOW (ref 15–41)
Albumin: 2.6 g/dL — ABNORMAL LOW (ref 3.5–5.0)
Albumin: 2.4 g/dL — ABNORMAL LOW (ref 3.5–5.0)
Alkaline Phosphatase: 116 U/L (ref 38–126)
Alkaline Phosphatase: 111 U/L (ref 38–126)
Anion gap: 12 (ref 5–15)
Anion gap: 12 (ref 5–15)
BUN: 63 mg/dL — ABNORMAL HIGH (ref 8–23)
BUN: 68 mg/dL — ABNORMAL HIGH (ref 8–23)
CO2: 17 mmol/L — ABNORMAL LOW (ref 22–32)
CO2: 16 mmol/L — ABNORMAL LOW (ref 22–32)
Calcium: 7.2 mg/dL — ABNORMAL LOW (ref 8.9–10.3)
Calcium: 7.2 mg/dL — ABNORMAL LOW (ref 8.9–10.3)
Chloride: 105 mmol/L (ref 98–111)
Chloride: 105 mmol/L (ref 98–111)
Creatinine, Ser: 4.52 mg/dL — ABNORMAL HIGH (ref 0.61–1.24)
Creatinine, Ser: 4.78 mg/dL — ABNORMAL HIGH (ref 0.61–1.24)
GFR, Estimated: 12 mL/min — ABNORMAL LOW (ref >60)
GFR, Estimated: 11 mL/min — ABNORMAL LOW (ref >60)
Glucose, Bld: 130 mg/dL — ABNORMAL HIGH (ref 70–99)
Glucose, Bld: 124 mg/dL — ABNORMAL HIGH (ref 70–99)
Potassium: 5.5 mmol/L — ABNORMAL HIGH (ref 3.5–5.1)
Potassium: 5.5 mmol/L — ABNORMAL HIGH (ref 3.5–5.1)
Sodium: 134 mmol/L — ABNORMAL LOW (ref 135–145)
Sodium: 133 mmol/L — ABNORMAL LOW (ref 135–145)
Total Bilirubin: 0.7 mg/dL (ref <1.2)
Total Bilirubin: 0.9 mg/dL (ref <1.2)
Total Protein: 5.7 g/dL — ABNORMAL LOW (ref 6.5–8.1)
Total Protein: 5.5 g/dL — ABNORMAL LOW (ref 6.5–8.1)

## 2023-05-02 LAB — MRSA NEXT GEN BY PCR, NASAL: MRSA by PCR Next Gen: NOT DETECTED

## 2023-05-02 LAB — LIPID PANEL
Cholesterol: 84 mg/dL (ref 0–200)
HDL: 23 mg/dL — ABNORMAL LOW (ref >40)
LDL Cholesterol: 47 mg/dL (ref 0–99)
Total CHOL/HDL Ratio: 3.7 {ratio}
Triglycerides: 68 mg/dL (ref <150)
VLDL: 14 mg/dL (ref 0–40)

## 2023-05-02 LAB — CBC WITH DIFFERENTIAL/PLATELET
Abs Immature Granulocytes: 0.96 10*3/uL — ABNORMAL HIGH (ref 0.00–0.07)
Basophils Absolute: 0 10*3/uL (ref 0.0–0.1)
Basophils Relative: 0 %
Eosinophils Absolute: 0 10*3/uL (ref 0.0–0.5)
Eosinophils Relative: 0 %
HCT: 22.8 % — ABNORMAL LOW (ref 39.0–52.0)
Hemoglobin: 7 g/dL — ABNORMAL LOW (ref 13.0–17.0)
Immature Granulocytes: 6 %
Lymphocytes Relative: 3 %
Lymphs Abs: 0.5 10*3/uL — ABNORMAL LOW (ref 0.7–4.0)
MCH: 28.9 pg (ref 26.0–34.0)
MCHC: 30.7 g/dL (ref 30.0–36.0)
MCV: 94.2 fL (ref 80.0–100.0)
Monocytes Absolute: 2.8 10*3/uL — ABNORMAL HIGH (ref 0.1–1.0)
Monocytes Relative: 17 %
Neutro Abs: 12 10*3/uL — ABNORMAL HIGH (ref 1.7–7.7)
Neutrophils Relative %: 74 %
Platelets: 363 10*3/uL (ref 150–400)
RBC: 2.42 MIL/uL — ABNORMAL LOW (ref 4.22–5.81)
RDW: 18.5 % — ABNORMAL HIGH (ref 11.5–15.5)
Smear Review: NORMAL
WBC: 16.3 10*3/uL — ABNORMAL HIGH (ref 4.0–10.5)
nRBC: 0.3 % — ABNORMAL HIGH (ref 0.0–0.2)

## 2023-05-02 LAB — CBC
HCT: 24 % — ABNORMAL LOW (ref 39.0–52.0)
Hemoglobin: 7.3 g/dL — ABNORMAL LOW (ref 13.0–17.0)
MCH: 28.4 pg (ref 26.0–34.0)
MCHC: 30.4 g/dL (ref 30.0–36.0)
MCV: 93.4 fL (ref 80.0–100.0)
Platelets: 363 10*3/uL (ref 150–400)
RBC: 2.57 MIL/uL — ABNORMAL LOW (ref 4.22–5.81)
RDW: 19.1 % — ABNORMAL HIGH (ref 11.5–15.5)
WBC: 16.9 10*3/uL — ABNORMAL HIGH (ref 4.0–10.5)
nRBC: 0.4 % — ABNORMAL HIGH (ref 0.0–0.2)

## 2023-05-02 LAB — URINALYSIS, COMPLETE (UACMP) WITH MICROSCOPIC
Bacteria, UA: NONE SEEN
Bilirubin Urine: NEGATIVE
Glucose, UA: NEGATIVE mg/dL
Hgb urine dipstick: NEGATIVE
Ketones, ur: NEGATIVE mg/dL
Leukocytes,Ua: NEGATIVE
Nitrite: NEGATIVE
Protein, ur: >=300 mg/dL — AB
Specific Gravity, Urine: 1.02 (ref 1.005–1.030)
Squamous Epithelial / HPF: 0 /[HPF] (ref 0–5)
pH: 5 (ref 5.0–8.0)

## 2023-05-02 LAB — T4, FREE: Free T4: 0.86 ng/dL (ref 0.61–1.12)

## 2023-05-02 LAB — BRAIN NATRIURETIC PEPTIDE: B Natriuretic Peptide: 349.9 pg/mL — ABNORMAL HIGH (ref 0.0–100.0)

## 2023-05-02 LAB — TROPONIN I (HIGH SENSITIVITY)
Troponin I (High Sensitivity): 32 ng/L — ABNORMAL HIGH (ref <18)
Troponin I (High Sensitivity): 41 ng/L — ABNORMAL HIGH (ref <18)

## 2023-05-02 LAB — PHOSPHORUS: Phosphorus: 7.1 mg/dL — ABNORMAL HIGH (ref 2.5–4.6)

## 2023-05-02 LAB — PROTIME-INR
INR: 1.4 — ABNORMAL HIGH (ref 0.8–1.2)
Prothrombin Time: 17.6 s — ABNORMAL HIGH (ref 11.4–15.2)

## 2023-05-02 LAB — SEDIMENTATION RATE: Sed Rate: 127 mm/h — ABNORMAL HIGH (ref 0–20)

## 2023-05-02 LAB — PREPARE RBC (CROSSMATCH)

## 2023-05-02 LAB — GLUCOSE, CAPILLARY
Glucose-Capillary: 115 mg/dL — ABNORMAL HIGH (ref 70–99)
Glucose-Capillary: 171 mg/dL — ABNORMAL HIGH (ref 70–99)

## 2023-05-02 LAB — TSH: TSH: 4.949 u[IU]/mL — ABNORMAL HIGH (ref 0.350–4.500)

## 2023-05-02 LAB — SARS CORONAVIRUS 2 BY RT PCR: SARS Coronavirus 2 by RT PCR: NEGATIVE

## 2023-05-02 LAB — APTT: aPTT: 46 s — ABNORMAL HIGH (ref 24–36)

## 2023-05-02 LAB — MAGNESIUM: Magnesium: 1.9 mg/dL (ref 1.7–2.4)

## 2023-05-02 LAB — LACTIC ACID, PLASMA: Lactic Acid, Venous: 1.1 mmol/L (ref 0.5–1.9)

## 2023-05-02 SURGERY — LEFT HEART CATH AND CORONARY ANGIOGRAPHY
Anesthesia: Moderate Sedation

## 2023-05-02 SURGERY — LEFT HEART CATH AND CORONARY ANGIOGRAPHY
Anesthesia: LOCAL

## 2023-05-02 MED ORDER — LEVOTHYROXINE SODIUM 25 MCG PO TABS
25.0000 ug | ORAL_TABLET | Freq: Every day | ORAL | Status: DC
  Administered 2023-05-03 – 2023-05-09 (×7): 25 ug via ORAL
  Filled 2023-05-02 (×7): qty 1

## 2023-05-02 MED ORDER — SIMVASTATIN 20 MG PO TABS
40.0000 mg | ORAL_TABLET | Freq: Every day | ORAL | Status: DC
  Administered 2023-05-03 – 2023-05-09 (×7): 40 mg via ORAL
  Filled 2023-05-02 (×7): qty 2

## 2023-05-02 MED ORDER — SODIUM CHLORIDE 0.9 % IV SOLN: 250.0000 mL | INTRAVENOUS | Status: AC | PRN

## 2023-05-02 MED ORDER — DEXTROSE 50 % IV SOLN
12.5000 g | Freq: Once | INTRAVENOUS | Status: AC
  Administered 2023-05-02: 12.5 g via INTRAVENOUS
  Filled 2023-05-02: qty 50

## 2023-05-02 MED ORDER — POLYETHYLENE GLYCOL 3350 17 G PO PACK
17.0000 g | PACK | Freq: Every day | ORAL | Status: DC | PRN
  Administered 2023-05-03: 17 g via ORAL
  Filled 2023-05-02: qty 1

## 2023-05-02 MED ORDER — SODIUM CHLORIDE 0.9 % IV SOLN
2.0000 g | INTRAVENOUS | Status: DC
  Administered 2023-05-02 – 2023-05-03 (×2): 2 g via INTRAVENOUS
  Filled 2023-05-02 (×2): qty 12.5

## 2023-05-02 MED ORDER — MIDODRINE HCL 5 MG PO TABS: 10.0000 mg | ORAL_TABLET | Freq: Three times a day (TID) | ORAL | Status: DC

## 2023-05-02 MED ORDER — SODIUM CHLORIDE 0.9 % IV BOLUS
500.0000 mL | INTRAVENOUS | Status: AC
  Administered 2023-05-02: 500 mL via INTRAVENOUS

## 2023-05-02 MED ORDER — HEPARIN (PORCINE) IN NACL 1000-0.9 UT/500ML-% IV SOLN
INTRAVENOUS | Status: DC | PRN
Start: 2023-05-02 — End: 2023-05-02
  Administered 2023-05-02: 1000 mL

## 2023-05-02 MED ORDER — ONDANSETRON HCL 4 MG/2ML IJ SOLN: 4.0000 mg | Freq: Four times a day (QID) | INTRAMUSCULAR | Status: DC | PRN

## 2023-05-02 MED ORDER — MELATONIN 5 MG PO TABS
5.0000 mg | ORAL_TABLET | Freq: Every evening | ORAL | Status: DC | PRN
  Administered 2023-05-04 – 2023-05-08 (×4): 5 mg via ORAL
  Filled 2023-05-02 (×4): qty 1

## 2023-05-02 MED ORDER — LIDOCAINE HCL (PF) 1 % IJ SOLN
INTRAMUSCULAR | Status: DC | PRN
  Administered 2023-05-02: 2 mL

## 2023-05-02 MED ORDER — PANTOPRAZOLE SODIUM 40 MG PO TBEC
40.0000 mg | DELAYED_RELEASE_TABLET | Freq: Every day | ORAL | Status: DC
  Administered 2023-05-03 – 2023-05-09 (×7): 40 mg via ORAL
  Filled 2023-05-02 (×7): qty 1

## 2023-05-02 MED ORDER — OXYCODONE HCL 5 MG PO TABS: 5.0000 mg | ORAL_TABLET | ORAL | Status: DC | PRN

## 2023-05-02 MED ORDER — LABETALOL HCL 5 MG/ML IV SOLN: 10.0000 mg | INTRAVENOUS | Status: DC | PRN

## 2023-05-02 MED ORDER — SODIUM CHLORIDE 0.9% IV SOLUTION: Freq: Once | INTRAVENOUS | Status: AC

## 2023-05-02 MED ORDER — SODIUM BICARBONATE 650 MG PO TABS
1300.0000 mg | ORAL_TABLET | Freq: Two times a day (BID) | ORAL | Status: DC
  Administered 2023-05-02: 1300 mg via ORAL
  Filled 2023-05-02: qty 2

## 2023-05-02 MED ORDER — AMIODARONE LOAD VIA INFUSION
150.0000 mg | Freq: Once | INTRAVENOUS | Status: AC
  Administered 2023-05-02: 150 mg via INTRAVENOUS
  Filled 2023-05-02: qty 83.34

## 2023-05-02 MED ORDER — SODIUM CHLORIDE 0.9% FLUSH: 3.0000 mL | INTRAVENOUS | Status: DC | PRN

## 2023-05-02 MED ORDER — HYDRALAZINE HCL 20 MG/ML IJ SOLN: 10.0000 mg | INTRAMUSCULAR | Status: DC | PRN

## 2023-05-02 MED ORDER — MIDAZOLAM HCL 2 MG/2ML IJ SOLN
INTRAMUSCULAR | Status: AC
Start: 2023-05-02 — End: ?
  Filled 2023-05-02: qty 2

## 2023-05-02 MED ORDER — PHENYLEPHRINE HCL-NACL 20-0.9 MG/250ML-% IV SOLN
20.0000 ug/min | INTRAVENOUS | Status: DC
  Administered 2023-05-02: 90 ug/min via INTRAVENOUS
  Administered 2023-05-02: 50 ug/min via INTRAVENOUS
  Administered 2023-05-03: 120 ug/min via INTRAVENOUS
  Administered 2023-05-03: 110 ug/min via INTRAVENOUS
  Administered 2023-05-03: 50 ug/min via INTRAVENOUS
  Administered 2023-05-03: 120 ug/min via INTRAVENOUS
  Filled 2023-05-02 (×6): qty 250

## 2023-05-02 MED ORDER — MIDODRINE HCL 5 MG PO TABS
10.0000 mg | ORAL_TABLET | ORAL | Status: AC
  Administered 2023-05-02: 10 mg via ORAL
  Filled 2023-05-02: qty 2

## 2023-05-02 MED ORDER — SODIUM CHLORIDE 0.9% IV SOLUTION: Freq: Once | INTRAVENOUS | Status: DC

## 2023-05-02 MED ORDER — IOHEXOL 300 MG/ML  SOLN
INTRAMUSCULAR | Status: DC | PRN
  Administered 2023-05-02: 25 mL

## 2023-05-02 MED ORDER — HEPARIN SODIUM (PORCINE) 1000 UNIT/ML IJ SOLN
INTRAMUSCULAR | Status: DC | PRN
  Administered 2023-05-02: 4500 [IU] via INTRAVENOUS

## 2023-05-02 MED ORDER — SODIUM CHLORIDE 0.9 % IV SOLN: INTRAVENOUS | Status: AC

## 2023-05-02 MED ORDER — PHENYLEPHRINE HCL-NACL 20-0.9 MG/250ML-% IV SOLN: 0.0000 ug/min | INTRAVENOUS | Status: DC

## 2023-05-02 MED ORDER — AMIODARONE HCL IN DEXTROSE 360-4.14 MG/200ML-% IV SOLN
60.0000 mg/h | INTRAVENOUS | Status: DC
  Filled 2023-05-02: qty 200

## 2023-05-02 MED ORDER — METRONIDAZOLE 500 MG/100ML IV SOLN
500.0000 mg | Freq: Two times a day (BID) | INTRAVENOUS | Status: DC
  Administered 2023-05-02 – 2023-05-03 (×2): 500 mg via INTRAVENOUS
  Filled 2023-05-02 (×2): qty 100

## 2023-05-02 MED ORDER — COLCHICINE 0.3 MG HALF TABLET
0.3000 mg | ORAL_TABLET | Freq: Two times a day (BID) | ORAL | Status: DC
  Administered 2023-05-02: 0.3 mg via ORAL
  Filled 2023-05-02: qty 1

## 2023-05-02 MED ORDER — PROCHLORPERAZINE EDISYLATE 10 MG/2ML IJ SOLN: 5.0000 mg | Freq: Four times a day (QID) | INTRAMUSCULAR | Status: DC | PRN

## 2023-05-02 MED ORDER — ACETAMINOPHEN 325 MG PO TABS
650.0000 mg | ORAL_TABLET | ORAL | Status: DC | PRN
  Administered 2023-05-04 – 2023-05-09 (×2): 650 mg via ORAL
  Filled 2023-05-02 (×2): qty 2

## 2023-05-02 MED ORDER — FENTANYL CITRATE (PF) 100 MCG/2ML IJ SOLN
INTRAMUSCULAR | Status: AC
Start: 2023-05-02 — End: ?
  Filled 2023-05-02: qty 2

## 2023-05-02 MED ORDER — VERAPAMIL HCL 2.5 MG/ML IV SOLN
INTRAVENOUS | Status: DC | PRN
  Administered 2023-05-02: 2.5 mg via INTRA_ARTERIAL

## 2023-05-02 MED ORDER — HEPARIN SODIUM (PORCINE) 5000 UNIT/ML IJ SOLN
5000.0000 [IU] | Freq: Three times a day (TID) | INTRAMUSCULAR | Status: DC
Start: 2023-05-02 — End: 2023-05-05
  Administered 2023-05-02 – 2023-05-05 (×8): 5000 [IU] via SUBCUTANEOUS
  Filled 2023-05-02 (×8): qty 1

## 2023-05-02 MED ORDER — STERILE WATER FOR INJECTION IV SOLN
INTRAVENOUS | Status: DC
  Filled 2023-05-02: qty 150
  Filled 2023-05-02: qty 1000
  Filled 2023-05-02: qty 150

## 2023-05-02 MED ORDER — HEPARIN SODIUM (PORCINE) 1000 UNIT/ML IJ SOLN
INTRAMUSCULAR | Status: AC
  Filled 2023-05-02: qty 10

## 2023-05-02 MED ORDER — SODIUM ZIRCONIUM CYCLOSILICATE 5 G PO PACK
10.0000 g | PACK | Freq: Three times a day (TID) | ORAL | Status: AC
  Administered 2023-05-02 – 2023-05-03 (×3): 10 g via ORAL
  Filled 2023-05-02 (×3): qty 2

## 2023-05-02 MED ORDER — DIPHENHYDRAMINE HCL 25 MG PO CAPS: 25.0000 mg | ORAL_CAPSULE | Freq: Three times a day (TID) | ORAL | Status: DC | PRN

## 2023-05-02 MED ORDER — MIDODRINE HCL 5 MG PO TABS
10.0000 mg | ORAL_TABLET | Freq: Three times a day (TID) | ORAL | Status: AC
  Administered 2023-05-03 (×2): 10 mg via ORAL
  Filled 2023-05-02 (×2): qty 2

## 2023-05-02 MED ORDER — GABAPENTIN 300 MG PO CAPS: 600.0000 mg | ORAL_CAPSULE | Freq: Three times a day (TID) | ORAL | Status: DC

## 2023-05-02 MED ORDER — PREDNISONE 20 MG PO TABS
20.0000 mg | ORAL_TABLET | Freq: Every day | ORAL | Status: DC
  Administered 2023-05-02 – 2023-05-09 (×7): 20 mg via ORAL
  Filled 2023-05-02: qty 1
  Filled 2023-05-02 (×3): qty 2
  Filled 2023-05-02: qty 1
  Filled 2023-05-02: qty 2
  Filled 2023-05-02: qty 1

## 2023-05-02 MED ORDER — HYDROMORPHONE HCL 1 MG/ML IJ SOLN: 0.5000 mg | INTRAMUSCULAR | Status: DC | PRN

## 2023-05-02 MED ORDER — ALBUMIN HUMAN 25 % IV SOLN: 50.0000 g | INTRAVENOUS | Status: DC

## 2023-05-02 MED ORDER — VANCOMYCIN HCL 1750 MG/350ML IV SOLN
1750.0000 mg | Freq: Once | INTRAVENOUS | Status: AC
  Administered 2023-05-02: 1750 mg via INTRAVENOUS
  Filled 2023-05-02: qty 350

## 2023-05-02 MED ORDER — AMIODARONE HCL IN DEXTROSE 360-4.14 MG/200ML-% IV SOLN: 30.0000 mg/h | INTRAVENOUS | Status: DC

## 2023-05-02 MED ORDER — VANCOMYCIN VARIABLE DOSE PER UNSTABLE RENAL FUNCTION (PHARMACIST DOSING): Status: DC

## 2023-05-02 MED ORDER — INSULIN ASPART 100 UNIT/ML IV SOLN
5.0000 [IU] | Freq: Once | INTRAVENOUS | Status: AC
  Administered 2023-05-02: 5 [IU] via INTRAVENOUS
  Filled 2023-05-02: qty 0.05

## 2023-05-02 MED ORDER — DULOXETINE HCL 20 MG PO CPEP
20.0000 mg | ORAL_CAPSULE | Freq: Every day | ORAL | Status: DC
Start: 2023-05-02 — End: 2023-05-02
  Filled 2023-05-02: qty 1

## 2023-05-02 MED ORDER — CHLORHEXIDINE GLUCONATE CLOTH 2 % EX PADS
6.0000 | MEDICATED_PAD | Freq: Every day | CUTANEOUS | Status: DC
  Administered 2023-05-02 – 2023-05-03 (×2): 6 via TOPICAL

## 2023-05-02 MED ORDER — ORAL CARE MOUTH RINSE: 15.0000 mL | OROMUCOSAL | Status: DC | PRN

## 2023-05-02 MED ORDER — DAPAGLIFLOZIN PROPANEDIOL 10 MG PO TABS: 10.0000 mg | ORAL_TABLET | Freq: Every day | ORAL | Status: DC

## 2023-05-02 MED ORDER — LIDOCAINE HCL 1 % IJ SOLN
INTRAMUSCULAR | Status: AC
  Filled 2023-05-02: qty 20

## 2023-05-02 MED ORDER — SODIUM CHLORIDE 0.9 % IV SOLN: Freq: Once | INTRAVENOUS | Status: AC

## 2023-05-02 MED ORDER — VERAPAMIL HCL 2.5 MG/ML IV SOLN
INTRAVENOUS | Status: AC
  Filled 2023-05-02: qty 2

## 2023-05-02 MED ORDER — HEPARIN (PORCINE) IN NACL 1000-0.9 UT/500ML-% IV SOLN
INTRAVENOUS | Status: AC
  Filled 2023-05-02: qty 1000

## 2023-05-02 MED ORDER — TAMSULOSIN HCL 0.4 MG PO CAPS: 0.8000 mg | ORAL_CAPSULE | Freq: Every evening | ORAL | Status: DC

## 2023-05-02 MED ORDER — SODIUM CHLORIDE 0.9% FLUSH
3.0000 mL | Freq: Two times a day (BID) | INTRAVENOUS | Status: DC
  Administered 2023-05-02 – 2023-05-08 (×13): 3 mL via INTRAVENOUS

## 2023-05-02 MED ORDER — DOCUSATE SODIUM 100 MG PO CAPS
100.0000 mg | ORAL_CAPSULE | Freq: Two times a day (BID) | ORAL | Status: DC | PRN
  Administered 2023-05-03: 100 mg via ORAL
  Filled 2023-05-02: qty 1

## 2023-05-02 SURGICAL SUPPLY — 12 items
CATH INFINITI JR4 5F (CATHETERS) IMPLANT
CATH VISTA GUIDE 6FR XBLAD3.5 (CATHETERS) IMPLANT
DEVICE RAD COMP TR BAND LRG (VASCULAR PRODUCTS) IMPLANT
DRAPE BRACHIAL (DRAPES) IMPLANT
GLIDESHEATH SLEND SS 6F .021 (SHEATH) IMPLANT
GUIDEWIRE INQWIRE 1.5J.035X260 (WIRE) IMPLANT
INQWIRE 1.5J .035X260CM (WIRE) ×1
KIT ENCORE 26 ADVANTAGE (KITS) IMPLANT
PACK CARDIAC CATH (CUSTOM PROCEDURE TRAY) ×1 IMPLANT
PROTECTION STATION PRESSURIZED (MISCELLANEOUS) ×1
SET ATX-X65L (MISCELLANEOUS) IMPLANT
STATION PROTECTION PRESSURIZED (MISCELLANEOUS) IMPLANT

## 2023-05-02 NOTE — ED Triage Notes (Signed)
Pt BIB EMS from home for generalized weakness. Given 324 mg of aspirin by EMS. Takes Plavix at home.    EMS vitals: 90/86 134 HR 180 BS

## 2023-05-02 NOTE — Consult Note (Signed)
NAME:  [name], MRN:  [id number], DOB:  1941-02-19, LOS: 0 ADMISSION DATE:  05/02/2023, CONSULTATION DATE:  05/02/23 REFERRING MD:  Dr. Okey Dupre, CHIEF COMPLAINT:  STEMI/weakness/abdominal pain  History of Present Illness:  82 yo M presenting to [facility] ED from home via EMS after initiation of code STEMI.  History obtained per chart review, patient and family bedside report as patient is slightly altered at this time. Patient had been in his normal state of health undergoing scheduled right shoulder arthroplasty on 04/13/2023 without complication.  He was admitted on 04/17/2023 with significant knee pain, AKI with hyperkalemia, acute on chronic anemia and concern for delirium discharging on 04/21/2023 to home.  Spouse reports no concerns until 04/30/2023 when she noticed her husband becoming weaker and having difficulty ambulating.  On 05/01/2023 he was unable to stand up on his own, reporting dizziness especially with positional changes as well as intermittent chest pain and generalized abdominal pain.  She also reports he would complain of " feeling very cold" and " feeling very hot", unclear what temperature ranges have been however.  Spouse reports a new nonproductive cough without congestion.  Family denies nausea or vomiting but did say the patient took some Imodium over the weekend so unclear if he was experiencing diarrhea and they are unaware of any urinary symptoms. Prior to EMS arrival the patient "slid" down his chair onto the floor, they deny he hit his head and they deny any LOC.  EMS reported EKG evidence of STE meeting STEMI criteria and alert was called.  Patient also received IV fluids for hypotension and ASA en route.  ED course: Upon arrival patient alert and responsive complaining of intermittent chest pain with associated shortness of breath and upper abdominal pain which radiates to his back.  Vital stable on arrival with mild tachycardia.  Patient taken urgently to Cath Lab due to  concern for anterior lateral infarction.  TRH consulted for admission. Initial Vitals: 98.5, 16, 118, 110/57 and 98% on 2 L nasal cannula Significant labs: (Labs/ Imaging personally reviewed) I, Cheryll Cockayne Rust-Chester, AGACNP-BC, personally viewed and interpreted this ECG. EKG Interpretation: Date: 05/02/2023, EKG Time: 18:34, Rate: 90, Rhythm: Atrial fibrillation, QRS Axis: Normal, Intervals: A-fib, ST/T Wave abnormalities: Anterolateral and inferior STE, Narrative Interpretation: Controlled atrial fibrillation with diffuse ST elevation (post cath) Chemistry: Na+: 134, K+: 5.5, BUN/Cr.: 63/4.52, Serum CO2/ AG: 17/12, albumin: 2.6 Hematology: WBC: 16.3, Hgb: 7.0,  Troponin: 32 > 41, BNP: pending, Lactic/ PCT: pending,  COVID-19 & Influenza A/B: pending / Respiratory viral panel: pending UA: pending  CXR 05/02/23: pending CT 05/02/23: pending  Patient received amiodarone bolus for A-fib RVR followed by infusion, heparin was held due to acute anemia.  Cardiac catheterization showed mild nonobstructive CAD with reduced LVEF and global hypokinesis as well as a small pericardial effusion.  Patient transferred to SDU for monitoring with concern for acute pericarditis.  Upon arrival to ICU patient became hypotensive, 500 mL bolus and midodrine administered with amiodarone infusion and home Flomax held.  Patient persistently hypotensive and phenylephrine drip initiated as well as 1 unit PRBCs for anemia.  PCCM consulted for assistance in management and monitoring due to suspected acute pericarditis, AKI with circulatory shock requiring vasopressor support secondary to hypovolemia/ABLA vs sepsis in the setting of chronic anemia and myelofibrosis.  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  Postcardiac catheterization acute pericarditis suspected.  Circulatory shock upon arrival to SDU, PCCM consulted and vasopressor support initiated.  Nephrology consulted.  Interim History / Subjective:  Patient lethargic RASS -1 to -2 with spouse, son and grandson bedside. Plan of care discussed in detail, all questions and concerns answered at this time.  Objective   Blood pressure (!) 96/57, pulse 94, temperature 97.7 F (36.5 C), temperature source Oral, resp. rate 16, height 5\' 10"  (1.778 m), weight 81.6 kg, SpO2 95%.        Intake/Output Summary (Last 24 hours) at 05/02/2023 2102 Last data filed at 05/02/2023 2000 Gross per 24 hour  Intake 2152 ml  Output 0 ml  Net 2152 ml   Filed Weights   05/02/23 1520  Weight: 81.6 kg    Examination: General: Adult male, critically ill, lying in bed, NAD HEENT: MM pale/moist, anicteric, atraumatic, neck supple Neuro: RASS: -1, A&O x 2, able to follow simple/persistent commands, PERRL +3, MAE CV: s1s2 RRR, controlled A-fib on monitor, no +r/m/g Pulm: Regular, non labored on 3L [place], breath sounds clear-BUL & diminished-BLL GI: soft, distended, non tender, bs x 4 Skin: clean/dry/intact R shoulder incision- no rashes/lesions noted Extremities: warm/dry, pulses + 2 R/P, trace edema noted  Resolved Hospital Problem list     Assessment & Plan:  Case discussed with Intensivist on call who is in agreement with plan below: Leukocytosis Suspected Sepsis with shock due to unknown etiology Abdominal pain s/t unknown etiology Patient received 500 mL bolus. Recent R shoulder incision assessed- clean, dry and intact - STAT lactic ordered, Blood Cultures, UA, respiratory viral panel & COVID panel - STAT CXR & CT abdomen/pelvis ordered - Supplemental oxygen as  needed, to maintain SpO2 > 90% - f/u cultures, trend lactic/ PCT - Daily CBC, monitor WBC/ fever curve - IV antibiotics: cefepime, flagyl & vancomycin - IVF hydration as needed - Continue vasopressors to maintain MAP< 65: phenylephrine started peripherally due to A-fib RVR earlier this admission  Circulatory Shock s/t ABLA, hypovolemia +/- Sepsis Acute on Chronic Anemia suspect secondary to ABLA in the setting of Chronic Kidney Disease & Myelofibrosis  PMHx: HTN, Myelofibrosis, Chronic Anemia, PAD Patient receiving 1 unit pRBC's & 500 mL bolus - continue phenylephrine as above - Monitor for s/s of bleeding - Daily CBC, STAT CBC post blood - Transfuse for Hgb <8 (due to cardiac dysfunction) - hold outpatient: amlodipine & plavix, consider restarting as patient stabilizes  Acute Kidney Injury superimposed on CKD Stage 3b NAGMA Hyperkalemia Discussed case with nephrology, recommendation for gentle bicarbonate drip overnight Baseline Cr: 2.14, Cr on admission: 4.52, patient received 1 dose of Lokelma - STAT CMP, Mg, Phos > f/u K+ - Strict I/O's: alert provider if UOP < 0.5 mL/kg/hr - gentle IVF hydration, sodium bicarbonate drip ordered - Daily BMP, replace electrolytes PRN - Avoid nephrotoxic agents as able, ensure adequate renal perfusion - Nephrology consulted, appreciate input, iHD vs CRRT as needed  - Farxiga on hold  Suspected Acute Pericarditis Extensive anterolateral and inferior STE Small Pericardial Effusion Cath: mild nonobstructive CAD - f/u ECHO in AM - STAT BNP, f/u CRP - Sed Rte: 127 > continue prednisone, colchicine discontinued due to CrCl < 15, not eligible for NSAIDs - Cardiology following appreciate input - Continuous cardiac monitoring  New Onset Atrial Fibrillation with  Rapid Ventricular Response- improved CHA2DS2-VASc score: 6 Patient received Amio bolus with correction of rate into 80's. Additional infusion held due to hemodynamics after rate  correction - Consider restarting Amiodarone infusion if rate uncontrolled - Hold systemic anticoagulation at this time > consider starting depending on pericardial effusion size and anemia - f/u TSH & thyroid panel - Echocardiogram ordered, as above  Hypothyroidism - continue outpatient Synthroid - f/u thyroid panel  Chronic Anxiety/Depression - home regimen on hold due to renal fxn, shock and mentation  Best Practice (right click and "Reselect all SmartList Selections" daily)  Diet/type: NPO w/ oral meds DVT prophylaxis: heparin injection 5,000 Units Start: 05/02/23 2200 SCD's Start: 05/02/23 1715 GI prophylaxis: N/A and PPI Lines: N/A Foley:  N/A Code Status:  full code Last date of multidisciplinary goals of care discussion [05/02/23]  Labs   CBC: Recent Labs  Lab 04/26/23 1000 05/02/23 1516  WBC  --  16.3*  NEUTROABS  --  12.0*  HGB 7.8* 7.0*  HCT 25.9* 22.8*  MCV  --  94.2  PLT  --  363    Basic Metabolic Panel: Recent Labs  Lab 05/02/23 1516  NA 134*  K 5.5*  CL 105  CO2 17*  GLUCOSE 130*  BUN 63*  CREATININE 4.52*  CALCIUM 7.2*   GFR: Estimated Creatinine Clearance: 13 mL/min (A) (by C-G formula based on SCr of 4.52 mg/dL (H)). Recent Labs  Lab 05/02/23 1516  WBC 16.3*    Liver Function Tests: Recent Labs  Lab 05/02/23 1516  AST 13*  ALT 13  ALKPHOS 116  BILITOT 0.7  PROT 5.7*  ALBUMIN 2.6*   No results for input(s): "LIPASE", "AMYLASE" in the last 168 hours. No results for input(s): "AMMONIA" in the last 168 hours.  ABG    Component Value Date/Time   TCO2 21 (L) 04/12/2022 1752     Coagulation Profile: Recent Labs  Lab 05/02/23 1516  INR 1.4*    Cardiac Enzymes: No results for input(s): "CKTOTAL", "CKMB", "CKMBINDEX", "TROPONINI" in the last 168 hours.  HbA1C: Hgb A1c MFr Bld  Date/Time Value Ref Range Status  11/21/2007 03:25 AM   Final   5.9 (NOTE)   The ADA recommends the following therapeutic goals for glycemic    control related to Hgb A1C measurement:   Goal of Therapy:   < 7.0% Hgb A1C   Action Suggested:  > 8.0% Hgb A1C   Ref:  Diabetes Care, 22, Suppl. 1, 1999    CBG: Recent Labs  Lab 05/02/23 1657  GLUCAP 115*    Review of Systems: Positives in BOLD  Gen: Denies fever, chills, weight change, fatigue, night sweats HEENT: Denies blurred vision, double vision, hearing loss, tinnitus, sinus congestion, rhinorrhea, sore throat, neck stiffness, dysphagia PULM: Denies shortness of breath, cough, sputum production, hemoptysis, wheezing CV: Denies chest pain, edema, orthopnea, paroxysmal nocturnal dyspnea, palpitations GI: Denies abdominal pain, nausea, vomiting, diarrhea, hematochezia, melena, constipation, change in bowel habits GU: Denies dysuria, hematuria, polyuria, oliguria, urethral discharge Endocrine: Denies hot or cold intolerance, polyuria, polyphagia or appetite change Derm: Denies rash, dry skin, scaling or peeling skin change Heme: Denies easy bruising, bleeding, bleeding gums Neuro: Denies headache, numbness, weakness, slurred speech, loss of memory or consciousness Challenging conversation due to lethargy. 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   Surgical History:   Past Surgical History:  Procedure Laterality Date   ASPIRATION / INJECTION RENAL CYST  07/08/2015   BACK SURGERY     approx 20- 25 years ago   COLONOSCOPY     EYE SURGERY     cataract both eyes   GAS INSERTION  08/11/2011   Procedure: INSERTION OF  GAS;  Surgeon: Sherrie George, MD;  Location: [facility] OR;  Service: Ophthalmology;  Laterality: Right;  [id number]   LEFT HEART CATH AND CORONARY ANGIOGRAPHY N/A 05/02/2023   Procedure: LEFT HEART CATH AND CORONARY ANGIOGRAPHY;  Surgeon: Yvonne Kendall, MD;  Location: [facility] INVASIVE CV LAB;  Service: Cardiovascular;  Laterality: N/A;   REVERSE SHOULDER ARTHROPLASTY Right 04/13/2023   Procedure: REVERSE SHOULDER ARTHROPLASTY;  Surgeon: Christena Flake, MD;  Location: [facility] ORS;  Service: Orthopedics;  Laterality: Right;   SCLERAL BUCKLE  08/11/2011   Procedure: SCLERAL BUCKLE;  Surgeon: Sherrie George, MD;  Location: [facility] OR;  Service: Ophthalmology;  Laterality: Right;   VARICOSE VEIN SURGERY       Social History:   reports that he quit smoking about 12 years ago. His smoking use included cigarettes. He has never used smokeless tobacco. He reports that he does not drink alcohol and does not use drugs.   Family History:  His family history includes Diabetes in his son; Heart disease in his father. There is no history of Kidney disease or Prostate cancer.   Allergies Allergies  Allergen Reactions   Meloxicam Nausea And Vomiting     Home Medications  Prior to Admission medications   Medication Sig Start Date End Date Taking? Authorizing Provider  acetaminophen (TYLENOL) 500 MG tablet Take 1,000 mg by mouth every 6 (six) hours as needed for mild pain (pain score 1-3).    [provider]  amLODipine (NORVASC) 10 MG tablet Take 1 tablet (10 mg total) by mouth every evening. 09/13/22   Johnson, Megan P, DO  azelastine (ASTELIN) 0.1 % nasal spray Place 1 spray into both nostrils 2 (two) times daily. Use in each nostril as directed 03/28/19   Particia Nearing, PA-C  Calcium Citrate-Vitamin D (CITRACAL + D PO) Take 1 tablet by mouth in the morning.    [provider]  Cholecalciferol (D3-1000 PO) Take 1,000 Units by mouth in the morning.    [provider]  clopidogrel (PLAVIX)  75 MG tablet TAKE ONE TABLET BY MOUTH EVERY DAY 02/17/23   Olevia Perches P, DO  dapagliflozin propanediol (FARXIGA) 10 MG TABS tablet Take 10 mg by mouth. 04/05/23 04/04/24  [provider]  DULoxetine (CYMBALTA) 20 MG capsule Take 1 capsule (20 mg total) by mouth at bedtime. TAKE 1 CAPSULE BY MOUTH EVERY DAY 04/13/23   Poggi, Excell Seltzer, MD  Fe Fum-Vit C-Vit B12-FA (TRIGELS-F FORTE) CAPS capsule Take 1 capsule by mouth 2 (two) times daily. Patient not taking: Reported on 04/25/2023 04/21/23   Arnetha Courser, MD  gabapentin (NEURONTIN) 300 MG capsule Take 600 mg by mouth 3 (three) times daily.    [provider]  hydrALAZINE (APRESOLINE) 25 MG tablet Take 1 tablet (25 mg total) by mouth every 8 (eight) hours. 04/21/23   Arnetha Courser, MD  ketoconazole (NIZORAL) 2 % cream Apply twice daily to affected area on buttocks until clear 03/29/23   Deirdre Evener,  MD  levothyroxine (SYNTHROID) 25 MCG tablet TAKE 1 TABLET EVERY DAY ON EMPTY STOMACHWITH A GLASS OF WATER AT LEAST 30-60 MINBEFORE BREAKFAST 01/04/23   Johnson, Megan P, DO  lidocaine (LIDODERM) 5 % Place 1 patch onto the skin daily. Remove & Discard patch within 12 hours or as directed by MD Patient taking differently: Place 1 patch onto the skin daily as needed. Remove & Discard patch within 12 hours or as directed by MD 03/20/23   Olevia Perches P, DO  meclizine (ANTIVERT) 25 MG tablet TAKE 1 TABLET BY MOUTH THREE TIMES DAILYAS NEEDED FOR DIZZINESS 02/10/23   Cannady, Corrie Dandy T, NP  Multiple Vitamin (MULITIVITAMIN WITH MINERALS) TABS Take 1 tablet by mouth in the morning.    [provider]  Naproxen Sodium 220 MG CAPS Take 440 mg by mouth daily as needed (pain).    [provider]  Nutritional Supplements (NEPRO) LIQD Take 1 Can by mouth 2 (two) times daily. 04/21/23   Arnetha Courser, MD  omeprazole (PRILOSEC) 40 MG capsule Take 1 capsule (40 mg total) by mouth at bedtime. 04/13/23   Poggi, Excell Seltzer, MD  oxyCODONE  (ROXICODONE) 5 MG immediate release tablet Take 1-2 tablets (5-10 mg total) by mouth every 4 (four) hours as needed for moderate pain (pain score 4-6) or severe pain (pain score 7-10). 04/13/23   Poggi, Excell Seltzer, MD  simvastatin (ZOCOR) 40 MG tablet Take 1 tablet (40 mg total) by mouth daily. 09/13/22   Johnson, Megan P, DO  sodium bicarbonate 650 MG tablet Take 2 tablets (1,300 mg total) by mouth 2 (two) times daily. 04/21/23   Arnetha Courser, MD  tadalafil (CIALIS) 5 MG tablet Take 1 tablet (5 mg total) by mouth daily. Patient taking differently: Take 5 mg by mouth at bedtime. 09/13/22   Johnson, Megan P, DO  tamsulosin (FLOMAX) 0.4 MG CAPS capsule Take 2 capsules (0.8 mg total) by mouth every evening. 04/13/23   Poggi, Excell Seltzer, MD  terbinafine (LAMISIL) 250 MG tablet Take 1 tablet (250 mg total) by mouth every evening. 04/13/23   Poggi, Excell Seltzer, MD  zinc gluconate 50 MG tablet Take 50 mg by mouth in the morning.    [provider]     Critical care time: 70 minutes     Betsey Holiday, AGACNP-BC Acute Care Nurse Practitioner [facility]   [phone] / [phone] Please see Amion for details.

## 2023-05-02 NOTE — Progress Notes (Signed)
500 cc IV fluid bolus and Midodrine 10 mg x 2 given due to low BPs in order to maintain MAP>65.  Amiodarone and home Flomax held.

## 2023-05-02 NOTE — Progress Notes (Signed)
Pharmacy Antibiotic Note  [name] is a 82 y.o. male admitted on 05/02/2023 with sepsis.  Pharmacy has been consulted for vancomycin and cefepime dosing.  Plan: Give vancomycin 1750 mg IV x1. Check random vanc level tomorrow given AKI (SCr 4.52, baseline 2.3-2.5) Start Cefepime 2 g IV Q24H Continue to monitor renal function and follow culture results   Height: 5\' 10"  (177.8 cm) Weight: 81.6 kg (180 lb) IBW/kg (Calculated) : 73  Temp (24hrs), Avg:97.9 F (36.6 C), Min:97.6 F (36.4 C), Max:98.5 F (36.9 C)  Recent Labs  Lab 05/02/23 1516  WBC 16.3*  CREATININE 4.52*    Estimated Creatinine Clearance: 13 mL/min (A) (by C-G formula based on SCr of 4.52 mg/dL (H)).    Allergies  Allergen Reactions   Meloxicam Nausea And Vomiting    Antimicrobials this admission: 11/26 Vanc >>  11/26 Cefepime >>   Dose adjustments this admission: None  Microbiology results: 11/26 MRSA PCR: Not detected  Thank you for allowing pharmacy to be a part of this patient's care.  Merryl Hacker, PharmD Clinical Pharmacist 05/02/2023 9:03 PM

## 2023-05-02 NOTE — H&P (Signed)
History and Physical    [name]:[id number] DOB: [birth year] DOA: 05/02/2023  PCP: [name], DO Patient coming from: Home  Chief Complaint: Generalized weakness, chest pain, abdominal pain that radiates to the back.  HPI: [name] is a 82 y.o. male with medical history significant of chronic HFpEF, hypertension, hyperlipidemia, myelofibrosis, CKD stage IIIb, anemia of chronic disease, who presented initially to [facility] ED as a STEMI alert.  Received a full dose aspirin.  Was taken to the Cath Lab emergently.  Per cardiologist Dr. Okey Dupre there were no significant coronary artery disease seen in the Cath Lab, therefore, no stents were placed.  Due to concern for pericardial effusion and possibly acute pericarditis, TR[facility], hospitalist service, was asked to admit for further workup and management.  The patient was seen in the stepdown unit.  At the time of this visit, he denies having any chest pain.  Added CRP ESR and colchicine for suspected acute pericarditis.  He also developed new onset A-fib with RVR.  Heparin was not started due to significant anemia with hemoglobin of 7.0 requiring blood transfusion.  1 unit PRBCs ordered to be transfused.  Review of Systems: As per HPI otherwise 10 point review of systems negative.  Review of Systems Otherwise negative except as per HPI, including: General: Denies fever, chills, night sweats or unintended weight loss. Resp: Denies cough, wheezing, shortness of breath. Cardiac: Denies chest pain, palpitations, orthopnea, paroxysmal nocturnal dyspnea. GI: Denies abdominal pain, nausea, vomiting, diarrhea or constipation GU: Denies dysuria, frequency, hesitancy or incontinence MS: Denies muscle aches, joint pain or swelling Neuro: Denies headache, neurologic deficits (focal weakness, numbness, tingling), abnormal gait Psych: Denies anxiety, depression, SI/HI/AVH Skin: Denies new rashes or lesions ID: Denies sick contacts, exotic exposures,  travel  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   right forearm, [facility]   Stroke (HCC) 11/2007   R brain subcortical infarct    Past Surgical History:  Procedure Laterality Date   ASPIRATION / INJECTION RENAL CYST  07/08/2015   BACK SURGERY     approx 20- 25 years ago   COLONOSCOPY     EYE SURGERY     cataract both eyes   GAS INSERTION  08/11/2011   Procedure: INSERTION OF GAS;  Surgeon: Sherrie George, MD;  Location: [facility] OR;  Service: Ophthalmology;  Laterality: Right;  [id number]   REVERSE SHOULDER ARTHROPLASTY Right 04/13/2023   Procedure: REVERSE SHOULDER ARTHROPLASTY;  Surgeon: Christena Flake, MD;  Location: [facility] ORS;  Service: Orthopedics;  Laterality: Right;   SCLERAL BUCKLE  08/11/2011   Procedure: SCLERAL BUCKLE;  Surgeon: Sherrie George, MD;  Location: [facility] OR;  Service: Ophthalmology;  Laterality: Right;   VARICOSE VEIN SURGERY      SOCIAL HISTORY:  reports that he quit smoking about 12 years ago. His smoking use included cigarettes. He has never used smokeless tobacco.  He reports that he does not drink alcohol and does not use drugs.  Allergies  Allergen Reactions   Meloxicam Nausea And Vomiting    FAMILY HISTORY: Family History  Problem Relation Age of Onset   Heart disease Father    Diabetes Son    Kidney disease Neg Hx    Prostate cancer Neg Hx      Prior to Admission medications   Medication Sig Start Date End Date Taking? Authorizing Provider   acetaminophen (TYLENOL) 500 MG tablet Take 1,000 mg by mouth every 6 (six) hours as needed for mild pain (pain score 1-3).    [provider]  amLODipine (NORVASC) 10 MG tablet Take 1 tablet (10 mg total) by mouth every evening. 09/13/22   Johnson, Megan P, DO  azelastine (ASTELIN) 0.1 % nasal spray Place 1 spray into both nostrils 2 (two) times daily. Use in each nostril as directed 03/28/19   Particia Nearing, PA-C  Calcium Citrate-Vitamin D (CITRACAL + D PO) Take 1 tablet by mouth in the morning.    [provider]  Cholecalciferol (D3-1000 PO) Take 1,000 Units by mouth in the morning.    [provider]  clopidogrel (PLAVIX) 75 MG tablet TAKE ONE TABLET BY MOUTH EVERY DAY 02/17/23   Olevia Perches P, DO  dapagliflozin propanediol (FARXIGA) 10 MG TABS tablet Take 10 mg by mouth. 04/05/23 04/04/24  [provider]  DULoxetine (CYMBALTA) 20 MG capsule Take 1 capsule (20 mg total) by mouth at bedtime. TAKE 1 CAPSULE BY MOUTH EVERY DAY 04/13/23   Poggi, Excell Seltzer, MD  Fe Fum-Vit C-Vit B12-FA (TRIGELS-F FORTE) CAPS capsule Take 1 capsule by mouth 2 (two) times daily. Patient not taking: Reported on 04/25/2023 04/21/23   Arnetha Courser, MD  gabapentin (NEURONTIN) 300 MG capsule Take 600 mg by mouth 3 (three) times daily.    [provider]  hydrALAZINE (APRESOLINE) 25 MG tablet Take 1 tablet (25 mg total) by mouth every 8 (eight) hours. 04/21/23   Arnetha Courser, MD  ketoconazole (NIZORAL) 2 % cream Apply twice daily to affected area on buttocks until clear 03/29/23   Deirdre Evener, MD  levothyroxine (SYNTHROID) 25 MCG tablet TAKE 1 TABLET EVERY DAY ON EMPTY STOMACHWITH A GLASS OF WATER AT LEAST 30-60 MINBEFORE BREAKFAST 01/04/23   Johnson, Megan P, DO  lidocaine (LIDODERM) 5 % Place 1 patch onto the skin daily. Remove & Discard patch within 12 hours or as directed by MD Patient taking differently: Place 1 patch onto the skin daily as needed. Remove &  Discard patch within 12 hours or as directed by MD 03/20/23   Olevia Perches P, DO  meclizine (ANTIVERT) 25 MG tablet TAKE 1 TABLET BY MOUTH THREE TIMES DAILYAS NEEDED FOR DIZZINESS 02/10/23   Cannady, Corrie Dandy T, NP  Multiple Vitamin (MULITIVITAMIN WITH MINERALS) TABS Take 1 tablet by mouth in the morning.    [provider]  Naproxen Sodium 220 MG CAPS Take 440 mg by mouth daily as needed (pain).    [provider]  Nutritional Supplements (NEPRO) LIQD Take 1 Can by mouth 2 (two) times daily. 04/21/23   Arnetha Courser, MD  omeprazole (PRILOSEC) 40 MG capsule Take 1 capsule (40 mg total) by mouth at bedtime. 04/13/23   Poggi, Excell Seltzer, MD  oxyCODONE (ROXICODONE) 5 MG immediate release tablet Take 1-2 tablets (5-10 mg total) by mouth every 4 (four) hours as needed for moderate pain (pain score 4-6) or severe pain (pain score 7-10).  04/13/23   Poggi, Excell Seltzer, MD  simvastatin (ZOCOR) 40 MG tablet Take 1 tablet (40 mg total) by mouth daily. 09/13/22   Johnson, Megan P, DO  sodium bicarbonate 650 MG tablet Take 2 tablets (1,300 mg total) by mouth 2 (two) times daily. 04/21/23   Arnetha Courser, MD  tadalafil (CIALIS) 5 MG tablet Take 1 tablet (5 mg total) by mouth daily. Patient taking differently: Take 5 mg by mouth at bedtime. 09/13/22   Johnson, Megan P, DO  tamsulosin (FLOMAX) 0.4 MG CAPS capsule Take 2 capsules (0.8 mg total) by mouth every evening. 04/13/23   Poggi, Excell Seltzer, MD  terbinafine (LAMISIL) 250 MG tablet Take 1 tablet (250 mg total) by mouth every evening. 04/13/23   Poggi, Excell Seltzer, MD  zinc gluconate 50 MG tablet Take 50 mg by mouth in the morning.    [provider]    Physical Exam: Vitals:   05/02/23 1519 05/02/23 1520 05/02/23 1527  BP: (!) 87/62  (!) 110/57  Pulse: (!) 118    Resp: 16    Temp: 98.5 F (36.9 C)    TempSrc: Oral    SpO2: 98%    Weight:  81.6 kg   Height:  5\' 10"  (1.778 m)       Constitutional: NAD, calm, comfortable Eyes: PERRL, lids and  conjunctivae normal ENMT: Mucous membranes are moist. Posterior pharynx clear of any exudate or lesions.Normal dentition.  Neck: normal, supple, no masses, no thyromegaly Respiratory: clear to auscultation bilaterally, no wheezing, no crackles. Normal respiratory effort. No accessory muscle use.  Cardiovascular: Regular rate and rhythm, no murmurs / rubs / gallops. No extremity edema. 2+ pedal pulses. No carotid bruits.  Abdomen: no tenderness, no masses palpated. No hepatosplenomegaly. Bowel sounds positive.  Musculoskeletal: no clubbing / cyanosis. No joint deformity upper and lower extremities. Good ROM, no contractures. Normal muscle tone.  Skin: no rashes, lesions, ulcers. No induration Neurologic: CN 2-12 grossly intact. Sensation intact, DTR normal. Strength 5/5 in all 4.  Psychiatric: Normal judgment and insight. Alert and oriented x 3. Normal mood.    Body mass index is 25.83 kg/m.      Labs on Admission: I have personally reviewed following labs and imaging studies  CBC: Recent Labs  Lab 04/26/23 1000 05/02/23 1516  WBC  --  16.3*  NEUTROABS  --  12.0*  HGB 7.8* 7.0*  HCT 25.9* 22.8*  MCV  --  94.2  PLT  --  363   Basic Metabolic Panel: Recent Labs  Lab 05/02/23 1516  NA 134*  K 5.5*  CL 105  CO2 17*  GLUCOSE 130*  BUN 63*  CREATININE 4.52*  CALCIUM 7.2*   GFR: Estimated Creatinine Clearance: 13 mL/min (A) (by C-G formula based on SCr of 4.52 mg/dL (H)). Liver Function Tests: Recent Labs  Lab 05/02/23 1516  AST 13*  ALT 13  ALKPHOS 116  BILITOT 0.7  PROT 5.7*  ALBUMIN 2.6*   No results for input(s): "LIPASE", "AMYLASE" in the last 168 hours. No results for input(s): "AMMONIA" in the last 168 hours. Coagulation Profile: Recent Labs  Lab 05/02/23 1516  INR 1.4*   Cardiac Enzymes: No results for input(s): "CKTOTAL", "CKMB", "CKMBINDEX", "TROPONINI" in the last 168 hours. BNP (last 3 results) No results for input(s): "PROBNP" in the last 8760  hours. HbA1C: No results for input(s): "HGBA1C" in the last 72 hours. CBG: No results for input(s): "GLUCAP" in the last 168 hours. Lipid Profile: Recent Labs  05/02/23 1516  CHOL 84  HDL 23*  LDLCALC 47  TRIG 68  CHOLHDL 3.7   Thyroid Function Tests: No results for input(s): "TSH", "T4TOTAL", "FREET4", "T3FREE", "THYROIDAB" in the last 72 hours. Anemia Panel: No results for input(s): "VITAMINB12", "FOLATE", "FERRITIN", "TIBC", "IRON", "RETICCTPCT" in the last 72 hours. Urine analysis:    Component Value Date/Time   COLORURINE YELLOW (A) 04/17/2023 2052   APPEARANCEUR CLOUDY (A) 04/17/2023 2052   APPEARANCEUR Clear 09/13/2022 1342   LABSPEC 1.018 04/17/2023 2052   PHURINE 5.0 04/17/2023 2052   GLUCOSEU 50 (A) 04/17/2023 2052   HGBUR NEGATIVE 04/17/2023 2052   BILIRUBINUR NEGATIVE 04/17/2023 2052   BILIRUBINUR Negative 09/13/2022 1342   KETONESUR NEGATIVE 04/17/2023 2052   PROTEINUR >=300 (A) 04/17/2023 2052   NITRITE NEGATIVE 04/17/2023 2052   LEUKOCYTESUR NEGATIVE 04/17/2023 2052   Sepsis Labs: !!!!!!!!!!!!!!!!!!!!!!!!!!!!!!!!!!!!!!!!!!!! @LABRCNTIP (procalcitonin:4,lacticidven:4) )No results found for this or any previous visit (from the past 240 hour(s)).   Radiological Exams on Admission: CARDIAC CATHETERIZATION  Result Date: 05/02/2023 Conclusions: Mild, non-obstructive coronary artery disease. Mildly elevated left ventricular filling pressure. Bedside ultrasound with mildly reduced LVEF with global hypokinesis and small pericardial effusion.  Query pericarditis/myocarditis. Recommendations: Admit to stepdown. Start amiodarone bolus/infusion for atrial fibrillation with rapid ventricular response. PRBC transfusion for target hemoglobin greater than 8. If hemoglobin stable and no enlargement of pericardial effusion, will need to consider heparin infusion in the setting of a-fib with RVR. Yvonne Kendall, MD [facility]   Nutritional status  All images have been  reviewed by me personally.  EKG: Independently reviewed.  Latest with atrial flutter with rate of 132.  Assessment/Plan Principal Problem:   Acute pericarditis   Suspected acute pericarditis Twelve-lead EKG with diffuse ST elevations Post LHC 05/02/2023, no significant coronary artery disease Pericardial effusion seen by interventional cardiologist during heart cath. Follow ESR, CRP Started colchicine, renally dose Follow 2D echo  Acute on chronic CKD 3B Baseline creatinine 1.91 GFR 35 Presented with creatinine of 4.52 with GFR 12 Avoid nephrotoxic agents, dehydration, and hypotension Closely monitor urine output. Last renal ultrasound done on 12/05/2022 showed increased echogenicity of the kidneys consistent with chronic medical renal disease, numerous large renal cysts.   Repeat renal function test in the morning.  Hyperkalemia in the setting of acute kidney injury Serum potassium 5.5 Lokelma 10 g x 3 doses Repeat renal function test in the morning  Anemia of chronic disease in the setting of CKD 3B Baseline hemoglobin appears to be 8 Presented with hemoglobin of 7.0 Transfuse 1 unit PRBCs to maintain hemoglobin above 8.0. No overt bleeding reported. Repeat CBC post blood transfusion.  Chronic HFpEF Euvolemic on exam Start strict I's and O's and daily weight Follow repeat 2D echo  New onset atrial fibrillation with RVR CHA2DS2-VASc 6 The patient was not started on anticoagulation due to severe anemia requiring blood transfusion Follow 2D echo Continue amiodarone Defer further management to cardiology  Chronic anxiety/depression Resume home regimen.  Peripheral artery disease Resume home regimen.  Hypothyroidism Resume home regimen.  Primary myelofibrosis History of melanoma Follows with oncology outpatient.  GERD Resume home PPI  BPH Resume home Tamsulosin Monitor urine output   Critical care time: 65 minutes.    DVT prophylaxis: Subcu heparin 3  times daily Code Status: Full code Family Communication: None at bedside Consults called: Cardiology Admission status: Inpatient status.  Status is: Inpatient The patient requires at least 2 midnights for further evaluation and treatment of present condition   Time Spent: 65  minutes.  >50% of the time was devoted to discussing the patients care, assessment, plan and disposition with other care givers along with counseling the patient about the risks and benefits of treatment.    Darlin Drop MD [facility]  If 7PM-7AM, please contact night-coverage   05/02/2023, 5:06 PM

## 2023-05-02 NOTE — ED Notes (Signed)
Transport to cath lab at this moment

## 2023-05-02 NOTE — Telephone Encounter (Signed)
  Chief Complaint: hard to arouse, pt stated "hurts everywhere" Symptoms: was advised abdominal pain by wife  Disposition: [x] ED /[] Urgent Care (no appt availability in office) / [] Appointment(In office/virtual)/ []  [facility]/ [] Home Care/ [] Refused Recommended Disposition /[] [facility]/ []  Follow-up with PCP Additional Notes: called 911 for assistance, stayed on phone until 911 arrived per pt's wife wishes.  Reason for Disposition  Difficult to awaken or acting confused (e.g., disoriented, slurred speech)  Answer Assessment - Initial Assessment Questions 1. LOCATION: "Where does it hurt?"      "Everywhere" 2. RADIATION: "Does the pain shoot anywhere else?" (e.g., chest, back)     Not answering questions 3. ONSET: "When did the pain begin?" (Minutes, hours or days ago)      Unable to get assess 4. SUDDEN: "Gradual or sudden onset?"     Unable to assess 5. PATTERN "Does the pain come and go, or is it constant?"    - If it comes and goes: "How long does it last?" "Do you have pain now?"     (Note: Comes and goes means the pain is intermittent. It goes away completely between bouts.)    - If constant: "Is it getting better, staying the same, or getting worse?"      (Note: Constant means the pain never goes away completely; most serious pain is constant and gets worse.)      Pt barely conscious 6. SEVERITY: "How bad is the pain?"  (e.g., Scale 1-10; mild, moderate, or severe)    - MILD (1-3): Doesn't interfere with normal activities, abdomen soft and not tender to touch.     - MODERATE (4-7): Interferes with normal activities or awakens from sleep, abdomen tender to touch.     - SEVERE (8-10): Excruciating pain, doubled over, unable to do any normal activities.       Unable to assess- pt altered mental status  10. OTHER SYMPTOMS: "Do you have any other symptoms?" (e.g., back pain, diarrhea, fever, urination pain, vomiting)       N/a  Protocols used: Abdominal  Pain - Male-A-AH

## 2023-05-02 NOTE — ED Provider Notes (Signed)
[facility] Provider Note    Event Date/Time   First MD Initiated Contact with Patient 05/02/23 1526     (approximate)   History   Weakness   HPI [name] is a 82 y.o. male with history of HTN, HLD, myelofibrosis, CKD stage III, anemia of chronic disease presenting today as a STEMI alert.  EMS was called due to patient primarily having profound weakness.  EKG in the field showed evidence of STEMI and alert was called.  On arrival, patient is noting some intermittent chest pain associated with shortness of breath and upper abdominal pain which radiates to his back.  No prior cardiac history.  Initially hypotensive with EMS and given fluids as well as given aspirin 324 and route.  Reviewed prior chart history including recent right total shoulder replacement.  Also with history of anemia and on Plavix at baseline.     Physical Exam   Triage Vital Signs: ED Triage Vitals  Encounter Vitals Group     BP 05/02/23 1519 (!) 87/62     Systolic BP Percentile --      Diastolic BP Percentile --      Pulse Rate 05/02/23 1519 (!) 118     Resp 05/02/23 1519 16     Temp 05/02/23 1519 98.5 F (36.9 C)     Temp Source 05/02/23 1519 Oral     SpO2 05/02/23 1519 98 %     Weight 05/02/23 1520 180 lb (81.6 kg)     Height 05/02/23 1520 5\' 10"  (1.778 m)     Head Circumference --      Peak Flow --      Pain Score 05/02/23 1519 10     Pain Loc --      Pain Education --      Exclude from Growth Chart --     Most recent vital signs: Vitals:   05/02/23 1519 05/02/23 1527  BP: (!) 87/62 (!) 110/57  Pulse: (!) 118   Resp: 16   Temp: 98.5 F (36.9 C)   SpO2: 98%    I have reviewed the vital signs. General:  Awake, alert, fatigued and ill-appearing.  Intermittently somnolent but arouses to persistent verbal stimuli. Head:  Normocephalic, Atraumatic. EENT:  PERRL, EOMI, Oral mucosa pink and moist, Neck is supple. Cardiovascular: Regular rate, 2+ distal  pulses. Respiratory:  Normal respiratory effort, symmetrical expansion, no distress.  No wheezing, rhonchi, or rales. Extremities:  Moving all four extremities through full ROM without pain.   Neuro:  Alert and oriented.  Interacting appropriately.   Skin:  Warm, dry, no rash.  Surgical incision wound on right shoulder which is well-healing. Psych: Appropriate affect.    ED Results / Procedures / Treatments   Labs (all labs ordered are listed, but only abnormal results are displayed) Labs Reviewed  CBC WITH DIFFERENTIAL/PLATELET - Abnormal; Notable for the following components:      Result Value   WBC 16.3 (*)    RBC 2.42 (*)    Hemoglobin 7.0 (*)    HCT 22.8 (*)    RDW 18.5 (*)    nRBC 0.3 (*)    All other components within normal limits  PROTIME-INR - Abnormal; Notable for the following components:   Prothrombin Time 17.6 (*)    INR 1.4 (*)    All other components within normal limits  APTT - Abnormal; Notable for the following components:   aPTT 46 (*)    All other components  within normal limits  LIPID PANEL - Abnormal; Notable for the following components:   HDL 23 (*)    All other components within normal limits  COMPREHENSIVE METABOLIC PANEL  TYPE AND SCREEN  TROPONIN I (HIGH SENSITIVITY)     EKG My EKG interpretation: ST elevations present in V4, V5, V6, 2, 3, and aVF.  Rate of 120.  Meeting STEMI criteria.   RADIOLOGY    PROCEDURES:  Critical Care performed: Yes, see critical care procedure note(s)  .Critical Care  Performed by: Janith Lima, MD Authorized by: Janith Lima, MD   Critical care provider statement:    Critical care time (minutes):  30   Critical care was necessary to treat or prevent imminent or life-threatening deterioration of the following conditions:  Cardiac failure (STEMI)   Critical care was time spent personally by me on the following activities:  Development of treatment plan with patient or surrogate, discussions with  consultants, evaluation of patient's response to treatment, examination of patient, ordering and review of laboratory studies, ordering and review of radiographic studies, ordering and performing treatments and interventions, pulse oximetry, re-evaluation of patient's condition and review of old charts   I assumed direction of critical care for this patient from another provider in my specialty: no     Care discussed with: admitting provider      MEDICATIONS ORDERED IN ED: Medications  Heparin (Porcine) in NaCl 1000-0.9 UT/500ML-% SOLN (1,000 mLs  Given 05/02/23 1543)  lidocaine (PF) (XYLOCAINE) 1 % injection (2 mLs Other Given 05/02/23 1544)  verapamil (ISOPTIN) injection (2.5 mg Intra-arterial Given 05/02/23 1545)  heparin sodium (porcine) injection (4,500 Units Intravenous Given 05/02/23 1546)  0.9 %  sodium chloride infusion ( Intravenous New Bag/Given 05/02/23 1524)     IMPRESSION / MDM / ASSESSMENT AND PLAN / ED COURSE  I reviewed the triage vital signs and the nursing notes.                              Differential diagnosis includes, but is not limited to, STEMI  Patient's presentation is most consistent with acute presentation with potential threat to life or bodily function.  Patient is an 82 year old male presenting today as a STEMI alert in the field.  EKG primarily meeting STEMI criteria and multiple contiguous leads concerning for potential anterior lateral infarction.  Cardiology at bedside and taking patient urgently to the Cath Lab.  Patient was given 1 L of fluids in the ED.  Heparin was initially held off until getting to the Cath Lab due to history of anemia and recent shoulder surgery.  The patient is on the cardiac monitor to evaluate for evidence of arrhythmia and/or significant heart rate changes.     FINAL CLINICAL IMPRESSION(S) / ED DIAGNOSES   Final diagnoses:  ST elevation myocardial infarction (STEMI), unspecified artery (HCC)  Hypotension, unspecified  hypotension type  Tachycardia  Anemia, unspecified type     Rx / DC Orders   ED Discharge Orders     None        Note:  This document was prepared using Dragon voice recognition software and may include unintentional dictation errors.   Janith Lima, MD 05/02/23 [phone]

## 2023-05-02 NOTE — Consult Note (Signed)
Cardiology Consultation   Patient ID: KENDER HANBY MRN: [id number]; DOB: August 19, 1940  Admit date: 05/02/2023 Date of Consult: 05/02/2023  PCP:  [name], DO   [facility] Providers Cardiologist:  Mariah Milling   Patient Profile:   [name] is a 82 y.o. male with a hx of hypertension, hyperlipidemia, PAD, stroke, myelofibrosis with chronic anemia, and chronic kidney disease stage III-IV  who is being seen 05/02/2023 for the evaluation of chest pain and abnormal EKG at the request of Dr. Anner Crete.  History of Present Illness:   [name] reports that he began feeling weak yesterday and subsequently developed abdominal and chest pain radiating to the right shoulder.  Of note, he had right shoulder surgery about 2 weeks ago that was complicated by acute on chronic anemia requiring PRBC transfusion.  Because of his weakness and symptoms, his wife called 911.  When the paramedics arrived today, they found him to be tachycardic with extensive ST segment elevation prompting activation of the STEMI team.  On arrival in the ER, [name] continued to complain of chest and abdominal pain radiating to the back.  EKG again showed anterolateral and inferior ST segment elevation.  Given his complex medical history, the risks and benefits of cardiac catheterization were discussed at length with [name] and decision made to proceed with emergent cardiac catheterization and possible PCI.  Cath showed mild, nonobstructive CAD.  Bedside echo that I personally performed showed mildly reduced LVEF with global hypokinesis confounded by atrial fibrillation with rapid ventricular response.  Small pericardial effusion was also noted.   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   right forearm, [facility]   Stroke (HCC) 11/2007   R brain subcortical infarct    Past Surgical History:  Procedure Laterality Date   ASPIRATION / INJECTION RENAL CYST  07/08/2015   BACK SURGERY     approx 20- 25 years ago   COLONOSCOPY     EYE SURGERY     cataract both eyes   GAS INSERTION  08/11/2011   Procedure: INSERTION OF GAS;  Surgeon: Sherrie George, MD;  [facility] OR;  Service: Ophthalmology;  Laterality: Right;  [id number]   REVERSE SHOULDER ARTHROPLASTY Right 04/13/2023   Procedure: REVERSE SHOULDER ARTHROPLASTY;  Surgeon: Christena Flake, MD;  Location: [facility] ORS;  Service: Orthopedics;  Laterality: Right;   SCLERAL BUCKLE  08/11/2011   Procedure: SCLERAL BUCKLE;  Surgeon: Sherrie George, MD;  Location: [facility] OR;  Service: Ophthalmology;  Laterality: Right;   VARICOSE VEIN SURGERY       Inpatient Medications: Scheduled Meds:  sodium chloride   Intravenous Once   colchicine  0.3 mg Oral BID   heparin  5,000 Units Subcutaneous Q8H   Continuous Infusions:  PRN Meds: diphenhydrAMINE, HYDROmorphone (DILAUDID) injection, melatonin, oxyCODONE, polyethylene glycol, prochlorperazine  Allergies:    Allergies  Allergen  Reactions   Meloxicam Nausea And Vomiting    Social History:   Social History   Tobacco Use   Smoking status: Former    Current packs/day: 0.00    Types: Cigarettes    Quit date: 06/06/2010    Years since quitting: 12.9   Smokeless tobacco: Never  Vaping Use   Vaping status: Never Used  Substance Use Topics   Alcohol use: No    Alcohol/week: 0.0 standard drinks of alcohol   Drug use: No    Family History:   Family History  Problem Relation Age of Onset    Heart disease Father    Diabetes Son    Kidney disease Neg Hx    Prostate cancer Neg Hx      ROS:  Review of Systems  Unable to perform ROS: Acuity of condition     Physical Exam/Data:   Vitals:   05/02/23 1519 05/02/23 1520 05/02/23 1527  BP: (!) 87/62  (!) 110/57  Pulse: (!) 118    Resp: 16    Temp: 98.5 F (36.9 C)    TempSrc: Oral    SpO2: 98%    Weight:  81.6 kg   Height:  5\' 10"  (1.778 m)    No intake or output data in the 24 hours ending 05/02/23 1712    05/02/2023    3:20 PM 04/21/2023    4:53 AM 04/20/2023    4:24 AM  Last 3 Weights  Weight (lbs) 180 lb 191 lb 9.3 oz 192 lb 3.9 oz  Weight (kg) 81.647 kg 86.9 kg 87.2 kg     Body mass index is 25.83 kg/m.  General: Elderly, pale appearing man lying on stretcher in the emergency department. HEENT: normal Neck: Unable to assess JVP due to positioning. Vascular: 2+ radial pulses bilaterally.  Mild swelling of the right arm noted. Cardiac: Tachycardia cardiac and irregularly irregular rhythm without murmurs. Lungs:  clear anteriorly. Abd: soft, nontender, no hepatomegaly  Ext: 1+ edema of the right arm.  No lower extremity edema.  Well-approximated right shoulder incision noted without surrounding erythema or discharge. Musculoskeletal: Able to move all 4 extremities. Skin: warm and dry  Neuro: Hearing impairment noted.  Otherwise, no focal deficits. Psych:  Normal affect   EKG:  The EKG was personally reviewed and demonstrates: Atrial fibrillation with rapid ventricular response and extensive anterolateral and inferior ST segment elevation. Telemetry:  Telemetry was personally reviewed and demonstrates: Atrial fibrillation with rapid ventricular response.  Relevant CV Studies: See catheterization below.  Laboratory Data:  High Sensitivity Troponin:   Recent Labs  Lab 05/02/23 1516  TROPONINIHS 32*     Chemistry Recent Labs  Lab 05/02/23 1516  NA 134*  K 5.5*  CL 105  CO2 17*  GLUCOSE  130*  BUN 63*  CREATININE 4.52*  CALCIUM 7.2*  GFRNONAA 12*  ANIONGAP 12    Recent Labs  Lab 05/02/23 1516  PROT 5.7*  ALBUMIN 2.6*  AST 13*  ALT 13  ALKPHOS 116  BILITOT 0.7   Lipids  Recent Labs  Lab 05/02/23 1516  CHOL 84  TRIG 68  HDL 23*  LDLCALC 47  CHOLHDL 3.7    Hematology Recent Labs  Lab 04/26/23 1000 05/02/23 1516  WBC  --  16.3*  RBC  --  2.42*  HGB 7.8* 7.0*  HCT 25.9* 22.8*  MCV  --  94.2  MCH  --  28.9  MCHC  --  30.7  RDW  --  18.5*  PLT  --  363   Thyroid No results for input(s): "TSH", "FREET4" in the last 168 hours.  BNPNo results for input(s): "BNP", "PROBNP" in the last 168 hours.  DDimer No results for input(s): "DDIMER" in the last 168 hours.   Radiology/Studies:  CARDIAC CATHETERIZATION  Result Date: 05/02/2023 Conclusions: Mild, non-obstructive coronary artery disease. Mildly elevated left ventricular filling pressure. Bedside ultrasound with mildly reduced LVEF with global hypokinesis and small pericardial effusion.  Query pericarditis/myocarditis. Recommendations: Admit to stepdown. Start amiodarone bolus/infusion for atrial fibrillation with rapid ventricular response. PRBC transfusion for target hemoglobin greater than 8. If hemoglobin stable and no enlargement of pericardial effusion, will need to consider heparin infusion in the setting of a-fib with RVR. Yvonne Kendall, MD [facility]    Assessment and Plan:   Acute pericarditis: Patient initially presents with chest and abdominal pain as well as generalized weakness for the last day.  EKG showed extensive anterolateral and inferior ST segment elevation concerning for extensive myocardial injury.  However, catheterization shows mild, nonobstructive coronary artery disease.  It is possible that Mr. Taul has acute pericarditis (possibly uremic in the setting of his acute kidney injury superimposed on chronic kidney disease).  Treatment options for pericarditis are limited  given his CKD (not a candidate for NSAIDs or colchicine).  We may have to consider steroids but I would like to check inflammatory markers such as ESR and CRP first.  Atrial fibrillation with rapid ventricular response: New diagnosis for the patient.  I will give a an amiodarone bolus and then place him on an infusion for rate control as his soft blood pressure precludes use of other rate controlling agents such as beta-blockers and calcium channel blockers.  I will defer heparin in the setting of his severe anemia and possible acute pericarditis.  If his hemoglobin remained stable and pericardial effusion does not enlarge, anticoagulation will need to be readdressed in the coming days.  Overall, I do not think Mr. Caramanica would be a good candidate for long-term anticoagulation with his severe anemia despite a CHA2DS2-VASc score of at least 6.  Acute blood loss anemia superimposed on chronic anemia from myelofibrosis: Hemoglobin 7.0 today, down slightly from recent checks and well below baseline in the setting of recent shoulder surgery.  I recommend PRBC transfusion for symptoms and to maintain hemoglobin greater than 8.  I will defer this to the primary team.  Type and screen has already been ordered.  Acute kidney injury superimposed on chronic kidney disease: Denies NSAID use though naproxen was on his medication list in our chart.  Avoid NSAIDs and other nephrotoxic agents.  I will hydrate Mr. Laso very gently given small amount of contrast used for catheterization today.  Consultation with nephrology may be helpful, particularly given concern that acute pericarditis could be related to acute kidney injury.  Essential hypertension: Blood pressure borderline low.  Hold home antihypertensive medications.  For questions or updates, please contact [facility] Please consult [email or web address] for contact info under [facility].   Signed, Yvonne Kendall, MD  05/02/2023 5:12 PM

## 2023-05-02 NOTE — Telephone Encounter (Signed)
FYI to provider. Patient currently at the hospital.

## 2023-05-02 NOTE — Plan of Care (Signed)
  Problem: Clinical Measurements: Goal: Ability to maintain clinical measurements within normal limits will improve Outcome: Not Progressing    BP unstable

## 2023-05-03 ENCOUNTER — Inpatient Hospital Stay: Payer: Medicare Other

## 2023-05-03 ENCOUNTER — Other Ambulatory Visit: Payer: Medicare Other

## 2023-05-03 ENCOUNTER — Ambulatory Visit: Payer: Medicare Other | Admitting: Oncology

## 2023-05-03 ENCOUNTER — Inpatient Hospital Stay (HOSPITAL_COMMUNITY)
Admit: 2023-05-03 | Discharge: 2023-05-03 | Disposition: A | Discharge status: Home / Self Care | Payer: Medicare Other | Attending: Cardiovascular Disease | Admitting: Cardiovascular Disease

## 2023-05-03 DIAGNOSIS — I309 Acute pericarditis, unspecified: Secondary | ICD-10-CM | POA: Diagnosis not present

## 2023-05-03 DIAGNOSIS — I213 ST elevation (STEMI) myocardial infarction of unspecified site: Secondary | ICD-10-CM

## 2023-05-03 DIAGNOSIS — R000 Tachycardia, unspecified: Secondary | ICD-10-CM

## 2023-05-03 DIAGNOSIS — N170 Acute kidney failure with tubular necrosis: Secondary | ICD-10-CM | POA: Diagnosis not present

## 2023-05-03 DIAGNOSIS — R579 Shock, unspecified: Secondary | ICD-10-CM

## 2023-05-03 DIAGNOSIS — D7581 Myelofibrosis: Secondary | ICD-10-CM | POA: Diagnosis not present

## 2023-05-03 DIAGNOSIS — I959 Hypotension, unspecified: Secondary | ICD-10-CM | POA: Diagnosis not present

## 2023-05-03 DIAGNOSIS — D649 Anemia, unspecified: Secondary | ICD-10-CM

## 2023-05-03 DIAGNOSIS — N1832 Chronic kidney disease, stage 3b: Secondary | ICD-10-CM | POA: Diagnosis not present

## 2023-05-03 DIAGNOSIS — R079 Chest pain, unspecified: Secondary | ICD-10-CM | POA: Diagnosis not present

## 2023-05-03 DIAGNOSIS — N179 Acute kidney failure, unspecified: Secondary | ICD-10-CM | POA: Diagnosis not present

## 2023-05-03 DIAGNOSIS — I890 Lymphedema, not elsewhere classified: Secondary | ICD-10-CM | POA: Insufficient documentation

## 2023-05-03 LAB — CBC
HCT: 24.9 % — ABNORMAL LOW (ref 39.0–52.0)
Hemoglobin: 7.8 g/dL — ABNORMAL LOW (ref 13.0–17.0)
MCH: 28.6 pg (ref 26.0–34.0)
MCHC: 31.3 g/dL (ref 30.0–36.0)
MCV: 91.2 fL (ref 80.0–100.0)
Platelets: 342 10*3/uL (ref 150–400)
RBC: 2.73 MIL/uL — ABNORMAL LOW (ref 4.22–5.81)
RDW: 19.6 % — ABNORMAL HIGH (ref 11.5–15.5)
WBC: 16.1 10*3/uL — ABNORMAL HIGH (ref 4.0–10.5)
nRBC: 0.2 % (ref 0.0–0.2)

## 2023-05-03 LAB — RESPIRATORY PANEL BY PCR

## 2023-05-03 LAB — GLUCOSE, CAPILLARY
Glucose-Capillary: 140 mg/dL — ABNORMAL HIGH (ref 70–99)
Glucose-Capillary: 124 mg/dL — ABNORMAL HIGH (ref 70–99)
Glucose-Capillary: 109 mg/dL — ABNORMAL HIGH (ref 70–99)
Glucose-Capillary: 92 mg/dL (ref 70–99)
Glucose-Capillary: 141 mg/dL — ABNORMAL HIGH (ref 70–99)
Glucose-Capillary: 134 mg/dL — ABNORMAL HIGH (ref 70–99)

## 2023-05-03 LAB — ECHOCARDIOGRAM COMPLETE
AR max vel: 2.42 cm2
AV Area VTI: 2.11 cm2
AV Area mean vel: 1.88 cm2
AV Mean grad: 5.7 mm[Hg]
AV Peak grad: 9.8 mm[Hg]
Ao pk vel: 1.56 m/s
Area-P 1/2: 3.48 cm2
Height: 70 in
MV VTI: 2.25 cm2
S' Lateral: 3.3 cm
Weight: 3058.22 [oz_av]

## 2023-05-03 LAB — RENAL FUNCTION PANEL
Albumin: 2.3 g/dL — ABNORMAL LOW (ref 3.5–5.0)
Anion gap: 12 (ref 5–15)
BUN: 74 mg/dL — ABNORMAL HIGH (ref 8–23)
CO2: 18 mmol/L — ABNORMAL LOW (ref 22–32)
Calcium: 6.9 mg/dL — ABNORMAL LOW (ref 8.9–10.3)
Chloride: 103 mmol/L (ref 98–111)
Creatinine, Ser: 4.88 mg/dL — ABNORMAL HIGH (ref 0.61–1.24)
GFR, Estimated: 11 mL/min — ABNORMAL LOW (ref >60)
Glucose, Bld: 124 mg/dL — ABNORMAL HIGH (ref 70–99)
Phosphorus: 8.1 mg/dL — ABNORMAL HIGH (ref 2.5–4.6)
Potassium: 5.2 mmol/L — ABNORMAL HIGH (ref 3.5–5.1)
Sodium: 133 mmol/L — ABNORMAL LOW (ref 135–145)

## 2023-05-03 LAB — RETICULOCYTES
Immature Retic Fract: 9.8 % (ref 2.3–15.9)
RBC.: 2.74 MIL/uL — ABNORMAL LOW (ref 4.22–5.81)
Retic Count, Absolute: 28.5 10*3/uL (ref 19.0–186.0)
Retic Ct Pct: 1 % (ref 0.4–3.1)

## 2023-05-03 LAB — BASIC METABOLIC PANEL
Anion gap: 9 (ref 5–15)
Anion gap: 14 (ref 5–15)
BUN: 70 mg/dL — ABNORMAL HIGH (ref 8–23)
BUN: 78 mg/dL — ABNORMAL HIGH (ref 8–23)
CO2: 19 mmol/L — ABNORMAL LOW (ref 22–32)
CO2: 19 mmol/L — ABNORMAL LOW (ref 22–32)
Calcium: 6.9 mg/dL — ABNORMAL LOW (ref 8.9–10.3)
Calcium: 6.7 mg/dL — ABNORMAL LOW (ref 8.9–10.3)
Chloride: 105 mmol/L (ref 98–111)
Chloride: 100 mmol/L (ref 98–111)
Creatinine, Ser: 4.91 mg/dL — ABNORMAL HIGH (ref 0.61–1.24)
Creatinine, Ser: 5.01 mg/dL — ABNORMAL HIGH (ref 0.61–1.24)
GFR, Estimated: 11 mL/min — ABNORMAL LOW (ref >60)
GFR, Estimated: 11 mL/min — ABNORMAL LOW (ref >60)
Glucose, Bld: 152 mg/dL — ABNORMAL HIGH (ref 70–99)
Glucose, Bld: 134 mg/dL — ABNORMAL HIGH (ref 70–99)
Potassium: 5.5 mmol/L — ABNORMAL HIGH (ref 3.5–5.1)
Potassium: 4.9 mmol/L (ref 3.5–5.1)
Sodium: 133 mmol/L — ABNORMAL LOW (ref 135–145)
Sodium: 133 mmol/L — ABNORMAL LOW (ref 135–145)

## 2023-05-03 LAB — PROCALCITONIN: Procalcitonin: 1.19 ng/mL

## 2023-05-03 LAB — HEMOGLOBIN AND HEMATOCRIT, BLOOD
HCT: 24.3 % — ABNORMAL LOW (ref 39.0–52.0)
HCT: 25.6 % — ABNORMAL LOW (ref 39.0–52.0)
HCT: 23.8 % — ABNORMAL LOW (ref 39.0–52.0)
Hemoglobin: 7.7 g/dL — ABNORMAL LOW (ref 13.0–17.0)
Hemoglobin: 8.1 g/dL — ABNORMAL LOW (ref 13.0–17.0)
Hemoglobin: 7.7 g/dL — ABNORMAL LOW (ref 13.0–17.0)

## 2023-05-03 LAB — PHOSPHORUS: Phosphorus: 7.6 mg/dL — ABNORMAL HIGH (ref 2.5–4.6)

## 2023-05-03 LAB — POCT ACTIVATED CLOTTING TIME: Activated Clotting Time: 199 s

## 2023-05-03 LAB — SODIUM, URINE, RANDOM: Sodium, Ur: 13 mmol/L

## 2023-05-03 LAB — MAGNESIUM: Magnesium: 2 mg/dL (ref 1.7–2.4)

## 2023-05-03 LAB — PROTIME-INR
INR: 1.4 — ABNORMAL HIGH (ref 0.8–1.2)
Prothrombin Time: 17.2 s — ABNORMAL HIGH (ref 11.4–15.2)

## 2023-05-03 LAB — LACTATE DEHYDROGENASE: LDH: 319 U/L — ABNORMAL HIGH (ref 98–192)

## 2023-05-03 LAB — C-REACTIVE PROTEIN: CRP: 25.9 mg/dL — ABNORMAL HIGH (ref <1.0)

## 2023-05-03 LAB — OSMOLALITY, URINE: Osmolality, Ur: 349 mosm/kg (ref 300–900)

## 2023-05-03 LAB — CG4 I-STAT (LACTIC ACID): Lactic Acid, Venous: 0.6 mmol/L (ref 0.5–1.9)

## 2023-05-03 MED ORDER — RENA-VITE PO TABS
1.0000 | ORAL_TABLET | Freq: Every day | ORAL | Status: DC
  Administered 2023-05-04 – 2023-05-08 (×5): 1 via ORAL
  Filled 2023-05-03 (×5): qty 1

## 2023-05-03 MED ORDER — CHLORHEXIDINE GLUCONATE CLOTH 2 % EX PADS
6.0000 | MEDICATED_PAD | Freq: Every day | CUTANEOUS | Status: DC
  Administered 2023-05-04 – 2023-05-08 (×3): 6 via TOPICAL

## 2023-05-03 MED ORDER — CHLORHEXIDINE GLUCONATE CLOTH 2 % EX PADS: 6.0000 | MEDICATED_PAD | Freq: Every day | CUTANEOUS | Status: DC

## 2023-05-03 MED ORDER — RENA-VITE PO TABS
1.0000 | ORAL_TABLET | Freq: Every day | ORAL | Status: DC
  Administered 2023-05-03: 1
  Filled 2023-05-03: qty 1

## 2023-05-03 MED ORDER — ENSURE ENLIVE PO LIQD
237.0000 mL | Freq: Three times a day (TID) | ORAL | Status: DC
  Administered 2023-05-04 – 2023-05-09 (×5): 237 mL via ORAL

## 2023-05-03 MED ORDER — STERILE WATER FOR INJECTION IJ SOLN
INTRAMUSCULAR | Status: AC
  Filled 2023-05-03: qty 20

## 2023-05-03 MED ORDER — LIDOCAINE 5 % EX PTCH
1.0000 | MEDICATED_PATCH | CUTANEOUS | Status: DC
  Administered 2023-05-03 – 2023-05-08 (×6): 1 via TRANSDERMAL
  Filled 2023-05-03 (×7): qty 1

## 2023-05-03 NOTE — Progress Notes (Signed)
PT Cancellation Note  Patient Details Name: [name] MRN: [id number] DOB: [birth year]   Cancelled Treatment:    Reason Eval/Treat Not Completed: Medical issues which prohibited therapy (Discussed with nurse. Ongoing medical issues. PT will continue with attempts.)  Donna Bernard, PT, MPT   Ina Homes 05/03/2023, 7:41 AM

## 2023-05-03 NOTE — Progress Notes (Signed)
Progress Note   Patient: [name] WUJ:[id number] DOB: 1940-06-28 DOA: 05/02/2023     1 DOS: the patient was seen and examined on 05/03/2023   Brief hospital course: JACOLBI SCOLA is a 82 y.o. male with medical history significant of chronic HFpEF, hypertension, hyperlipidemia, myelofibrosis, CKD stage IIIb, anemia of chronic disease, who presented initially to [facility] ED with chest pain and shortness of breath.  Troponin 41, EKG showed ST elevation.  Patient was taken to the Cath Lab, did not show any occlusion in coronary arteries.  Condition consistent with acute pericarditis.  Treated with colchicine. Also has significant worsening renal function at the time of admission, associated with hyperkalemia.  Patient was given IV fluids, Lokelma.  Followed by nephrology.   Principal Problem:   Acute pericarditis Active Problems:   Acute kidney injury superimposed on stage 3b chronic kidney disease (HCC)   Shock circulatory (HCC)   Assessment and Plan: Acute pericarditis. Patient has been seen by cardiology, cardiac cath did not show significant coronary artery disease.  Patient has acute pericarditis.  Colchicine was started.  Continue to follow.  Acute hypoxemic respiratory failure. Patient had a signal short of breath, potentially from pericarditis.  Oxygen saturation dropped down to 87%, currently on 2 to 3 L oxygen.  Continue to follow.  SIRS. Likely hypovolemic shock. Patient had a significant hypotension, resulting in severe acute kidney injury.  He also has a mild elevation of procalcitonin level, but could not identify any source of infection. Currently he is on broad-spectrum antibiotics with vancomycin and cefepime.  Blood culture still pending.  I will repeat a procalcitonin level tomorrow as initial level was elevated at 1.19.  Will decide if antibiotics could be discontinued tomorrow. Patient is more hemodynamically stable today.  Acute renal failure on chronic kidney  disease stage IIIb. Hyperkalemia secondary to acute renal failure. Metabolic acidosis secondary to acute renal failure. Hyponatremia. Patient also received IV contrast, but renal function already worsened at the time of admission.  Worsening renal function appears to be secondary to sigmoid hypotension.  Patient currently on bicarb drip.  Appreciated nephrology consult.  Potassium is better after giving Lokelma.  Anemia of chronic kidney disease. Patient recent study with iron and B12 levels are normal.  Continue monitor hemoglobin and transfuse as needed.  Anasarca. Right arm lymphedema. CT scan abdomen/pelvis showed evidence of anasarca, this could be secondary to renal failure. Right arm lymphedema, no evidence of infection.  Seen by orthopedics.     Subjective:  Patient still has some short of breath, requiring 2 L oxygen.  Physical Exam: Vitals:   05/03/23 1030 05/03/23 1045 05/03/23 1200 05/03/23 1330  BP: (!) 109/58 99/67 107/66 106/63  Pulse: 95 87 84 85  Resp: 15 14 13 18   Temp:   98.2 F (36.8 C)   TempSrc:   Axillary   SpO2: 95% 95% 96% 97%  Weight:      Height:       General exam: Appears calm and comfortable  Respiratory system: Coarse breathing sounds. Respiratory effort normal. Cardiovascular system: S1 & S2 heard, RRR. No JVD, murmurs, rubs, gallops or clicks.  Significant right arm edema. Gastrointestinal system: Abdomen is nondistended, soft and nontender. No organomegaly or masses felt. Normal bowel sounds heard. Central nervous system: Alert and oriented. No focal neurological deficits. Extremities: Symmetric 5 x 5 power. Skin: No rashes, lesions or ulcers Psychiatry: Judgement and insight appear normal. Mood & affect appropriate.    Data Reviewed:  CT  scan results and lab results reviewed.  Family Communication: Wife updated at bedside.  Disposition: Status is: Inpatient Remains inpatient appropriate because: Severity of disease, IV  treatment.     Time spent: 60 minutes  Author: Marrion Coy, MD 05/03/2023 2:30 PM  For on call review [email or web address].

## 2023-05-03 NOTE — Progress Notes (Signed)
[facility] [place], [place] 05/03/23  Subjective:   Hospital day # 1   Patient known to our practice from outpatient follow-up of CKD.  Last seen on October 30.  He was started on Farxiga at that time.  Previous autoimmune workup in June 2024 negative ANA, anti-GBM, PLA2R, ANCA.  Creatinine as outpatient was 2.58/GFR 24 from April 05, 2023.  Patient underwent reverse right total shoulder arthroplasty on April 13, 2023. He presented to the emergency room on November 11 for right knee pain and possible hallucinations.  He was admitted from 04/17/2023 and discharged on 04/21/2023 Creatinine at the time of discharge was 2.52/GFR 25 which is at his baseline. Potassium was mildly elevated at 5.5.  Hemoglobin was 8.3.  Losartan was stopped and he was started on hydralazine 25 mg 3 times per day.  He was also prescribed gabapentin 600 mg 3 times per day.  He presented to the emergency room for complaints of hard to arouse, generalized pain.  Triage notes indicate generalized weakness.  Blood pressure was 90/86 upon arrival with heart rate of 134.  He was noted to have elevated ST segment on EKG and thought to have STEMI.  He underwent emergent cardiac catheterization which showed mild nonobstructive coronary disease, mildly reduced LVEF with global hypokinesis and small pericardial effusion.  A question was raised about pericarditis versus myocarditis.  He was started on amiodarone for atrial fibrillation with RVR.  He was also noted to have elevated creatinine of 4.52 at admission which has increased further to 4.91 this morning. Urine output has decreased significantly.  Patient has a Foley catheter.  His wife is at bedside today when seen. He is requiring oxygen supplementation.     Objective:  Vital signs in last 24 hours:  Temp:  [97.5 F (36.4 C)-98.4 F (36.9 C)] 98.2 F (36.8 C) (11/27 1200) Pulse Rate:  [71-122] 85 (11/27 1330) Resp:  [12-31] 18 (11/27 1330) BP:  (69-126)/(44-96) 106/63 (11/27 1330) SpO2:  [87 %-100 %] 97 % (11/27 1330) Weight:  [86.7 kg] 86.7 kg (11/27 0315)  Weight change:  Filed Weights   05/02/23 1520 05/03/23 0315  Weight: 81.6 kg 86.7 kg    Intake/Output:    Intake/Output Summary (Last 24 hours) at 05/03/2023 1535 Last data filed at 05/03/2023 1352 Gross per 24 hour  Intake 4190.69 ml  Output 395 ml  Net 3795.69 ml     Physical Exam: General: Ill-appearing, laying in the bed  HEENT Moist oral mucous membranes  Pulm/lungs Rowley O2, coarse breath sounds at bases, no crackles  CVS/Heart Irregular rhythm, tachycardic  Abdomen:  Soft, tympanic, distended  Extremities: Trace peripheral edema  Neurologic: Alert, able to answer simple questions  Skin: Warm, dry  Access:    Foley in place    Basic Metabolic Panel:  Recent Labs  Lab 05/02/23 1516 05/02/23 2205 05/03/23 0532 05/03/23 1115  NA 134* 133* 133* 133*  K 5.5* 5.5* 5.5* 5.2*  CL 105 105 105 103  CO2 17* 16* 19* 18*  GLUCOSE 130* 124* 152* 124*  BUN 63* 68* 70* 74*  CREATININE 4.52* 4.78* 4.91* 4.88*  CALCIUM 7.2* 7.2* 6.9* 6.9*  MG  --  1.9 2.0  --   PHOS  --  7.1* 7.6* 8.1*     CBC: Recent Labs  Lab 05/02/23 1516 05/02/23 2205 05/03/23 0256 05/03/23 0532 05/03/23 1326  WBC 16.3* 16.9*  --  16.1*  --   NEUTROABS 12.0*  --   --   --   --  HGB 7.0* 7.3* 7.7* 7.8* 8.1*  HCT 22.8* 24.0* 24.3* 24.9* 25.6*  MCV 94.2 93.4  --  91.2  --   PLT 363 363  --  342  --      No results found for: "HEPBSAG", "HEPBSAB", "HEPBIGM"    Microbiology:  Recent Results (from the past 240 hour(s))  MRSA Next Gen by PCR, Nasal     Status: None   Collection Time: 05/02/23  5:32 PM   Specimen: Nasal Mucosa; Nasal Swab  Result Value Ref Range Status   MRSA by PCR Next Gen NOT DETECTED NOT DETECTED Final    Comment: (NOTE) The GeneXpert MRSA Assay (FDA approved for NASAL specimens only), is one component of a comprehensive MRSA colonization  surveillance program. It is not intended to diagnose MRSA infection nor to guide or monitor treatment for MRSA infections. Test performance is not FDA approved in patients less than 59 years old. Performed at [facility], [address] [place], [place] [address]   SARS Coronavirus 2 by RT PCR (hospital order, performed in [facility] hospital lab) *cepheid single result test* Anterior Nasal Swab     Status: None   Collection Time: 05/02/23  9:04 PM   Specimen: Anterior Nasal Swab  Result Value Ref Range Status   SARS Coronavirus 2 by RT PCR NEGATIVE NEGATIVE Final    Comment: (NOTE) SARS-CoV-2 target nucleic acids are NOT DETECTED.  The SARS-CoV-2 RNA is generally detectable in upper and lower respiratory specimens during the acute phase of infection. The lowest concentration of SARS-CoV-2 viral copies this assay can detect is 250 copies / mL. A negative result does not preclude SARS-CoV-2 infection and should not be used as the sole basis for treatment or other patient management decisions.  A negative result may occur with improper specimen collection / handling, submission of specimen other than nasopharyngeal swab, presence of viral mutation(s) within the areas targeted by this assay, and inadequate number of viral copies (<250 copies / mL). A negative result must be combined with clinical observations, patient history, and epidemiological information.  Fact Sheet for Patients:   RoadLapTop.co.za  Fact Sheet for Healthcare Providers: http://kim-miller.com/  This test is not yet approved or  cleared by the [place] FDA and has been authorized for detection and/or diagnosis of SARS-CoV-2 by FDA under an Emergency Use Authorization (EUA).  This EUA will remain in effect (meaning this test can be used) for the duration of the COVID-19 declaration under Section 564(b)(1) of the Act, 21 U.S.C. section 360bbb-3(b)(1),  unless the authorization is terminated or revoked sooner.  Performed at [facility], [address] [place], [place] [address]   Respiratory (~20 pathogens) panel by PCR     Status: None   Collection Time: 05/02/23  9:04 PM   Specimen: Nasopharyngeal Swab; Respiratory  Result Value Ref Range Status   Adenovirus NOT DETECTED NOT DETECTED Final   Coronavirus 229E NOT DETECTED NOT DETECTED Final    Comment: (NOTE) The Coronavirus on the Respiratory Panel, DOES NOT test for the novel  Coronavirus (2019 nCoV)    Coronavirus HKU1 NOT DETECTED NOT DETECTED Final   Coronavirus NL63 NOT DETECTED NOT DETECTED Final   Coronavirus OC43 NOT DETECTED NOT DETECTED Final   Metapneumovirus NOT DETECTED NOT DETECTED Final   Rhinovirus / Enterovirus NOT DETECTED NOT DETECTED Final   Influenza A NOT DETECTED NOT DETECTED Final   Influenza B NOT DETECTED NOT DETECTED Final   Parainfluenza Virus 1 NOT DETECTED  NOT DETECTED Final   Parainfluenza Virus 2 NOT DETECTED NOT DETECTED Final   Parainfluenza Virus 3 NOT DETECTED NOT DETECTED Final   Parainfluenza Virus 4 NOT DETECTED NOT DETECTED Final   Respiratory Syncytial Virus NOT DETECTED NOT DETECTED Final   Bordetella pertussis NOT DETECTED NOT DETECTED Final   Bordetella Parapertussis NOT DETECTED NOT DETECTED Final   Chlamydophila pneumoniae NOT DETECTED NOT DETECTED Final   Mycoplasma pneumoniae NOT DETECTED NOT DETECTED Final    Comment: Performed at [facility], [address] [place], [place] [address]  Culture, blood (Routine X 2) w Reflex to ID Panel     Status: None (Preliminary result)   Collection Time: 05/02/23 10:05 PM   Specimen: BLOOD  Result Value Ref Range Status   Specimen Description BLOOD RIGHT WRIST  Final   Special Requests   Final    BOTTLES DRAWN AEROBIC AND ANAEROBIC Blood Culture adequate volume   Culture   Final    NO GROWTH < 12 HOURS Performed at [facility], [address]  [place], [place] [address]    Report Status PENDING  Incomplete  Culture, blood (Routine X 2) w Reflex to ID Panel     Status: None (Preliminary result)   Collection Time: 05/02/23 10:05 PM   Specimen: BLOOD  Result Value Ref Range Status   Specimen Description BLOOD RIGHT HAND  Final   Special Requests   Final    BOTTLES DRAWN AEROBIC ONLY Blood Culture adequate volume   Culture   Final    NO GROWTH < 12 HOURS Performed at [facility], [address] [place], [place] [address]    Report Status PENDING  Incomplete    Coagulation Studies: Recent Labs    05/02/23 1516 05/03/23 0532  LABPROT 17.6* 17.2*  INR 1.4* 1.4*    Urinalysis: Recent Labs    05/02/23 2217  COLORURINE AMBER*  LABSPEC 1.020  PHURINE 5.0  GLUCOSEU NEGATIVE  HGBUR NEGATIVE  BILIRUBINUR NEGATIVE  KETONESUR NEGATIVE  PROTEINUR >=300*  NITRITE NEGATIVE  LEUKOCYTESUR NEGATIVE      Imaging: US Venous Img Upper Uni Right(DVT)  Result Date: 05/03/2023 CLINICAL DATA:  Right upper extremity pain and edema. Evaluate for DVT. EXAM: RIGHT UPPER EXTREMITY VENOUS DOPPLER ULTRASOUND TECHNIQUE: Gray-scale sonography with graded compression, as well as color Doppler and duplex ultrasound were performed to evaluate the upper extremity deep venous system from the level of the subclavian vein and including the jugular, axillary, basilic, radial, ulnar and upper cephalic vein. Spectral Doppler was utilized to evaluate flow at rest and with distal augmentation maneuvers. COMPARISON:  None Available. FINDINGS: Contralateral Subclavian Vein: Respiratory phasicity is normal and symmetric with the symptomatic side. No evidence of thrombus. Normal compressibility. Internal Jugular Vein: No evidence of thrombus. Normal compressibility, respiratory phasicity and response to augmentation. Subclavian Vein: No evidence of thrombus. Normal compressibility, respiratory phasicity and response to augmentation. Axillary Vein: No  evidence of thrombus. Normal compressibility, respiratory phasicity and response to augmentation. Cephalic Vein: No evidence of thrombus. Normal compressibility, respiratory phasicity and response to augmentation. Basilic Vein: No evidence of thrombus. Normal compressibility, respiratory phasicity and response to augmentation. Brachial Veins: No evidence of thrombus. Normal compressibility, respiratory phasicity and response to augmentation. Radial Veins: No evidence of thrombus. Normal compressibility, respiratory phasicity and response to augmentation. Ulnar Veins: No evidence of thrombus. Normal compressibility, respiratory phasicity and response to augmentation. Other Findings: There is a minimal amount of subcutaneous edema at the level of the  right upper arm and forearm. IMPRESSION: No evidence of DVT within the right upper extremity. Electronically Signed   By: Simonne Come M.D.   On: 05/03/2023 12:55   US Venous Img Lower Bilateral (DVT)  Result Date: 05/03/2023 EXAM: BILATERAL LOWER EXTREMITY VENOUS DOPPLER ULTRASOUND TECHNIQUE: Gray-scale sonography with graded compression, as well as color Doppler and duplex ultrasound were performed to evaluate the lower extremity deep venous systems from the level of the common femoral vein and including the common femoral, femoral, profunda femoral, popliteal and calf veins including the posterior tibial, peroneal and gastrocnemius veins when visible. The superficial great saphenous vein was also interrogated. Spectral Doppler was utilized to evaluate flow at rest and with distal augmentation maneuvers in the common femoral, femoral and popliteal veins. COMPARISON:  None Available. FINDINGS: RIGHT LOWER EXTREMITY Common Femoral Vein: No evidence of thrombus. Normal compressibility, respiratory phasicity and response to augmentation. Saphenofemoral Junction: No evidence of thrombus. Normal compressibility and flow on color Doppler imaging. Profunda Femoral Vein: No  evidence of thrombus. Normal compressibility and flow on color Doppler imaging. Femoral Vein: No evidence of thrombus. Normal compressibility, respiratory phasicity and response to augmentation. Popliteal Vein: No evidence of thrombus. Normal compressibility, respiratory phasicity and response to augmentation. Calf Veins: No evidence of thrombus. Normal compressibility and flow on color Doppler imaging. Superficial Great Saphenous Vein: No evidence of thrombus. Normal compressibility. Venous Reflux:  None. Other Findings:  None. LEFT LOWER EXTREMITY Common Femoral Vein: No evidence of thrombus. Normal compressibility, respiratory phasicity and response to augmentation. Saphenofemoral Junction: No evidence of thrombus. Normal compressibility and flow on color Doppler imaging. Profunda Femoral Vein: No evidence of thrombus. Normal compressibility and flow on color Doppler imaging. Femoral Vein: No evidence of thrombus. Normal compressibility, respiratory phasicity and response to augmentation. Popliteal Vein: No evidence of thrombus. Normal compressibility, respiratory phasicity and response to augmentation. Calf Veins: No evidence of thrombus. Normal compressibility and flow on color Doppler imaging. Superficial Great Saphenous Vein: No evidence of thrombus. Normal compressibility. Venous Reflux:  None. Other Findings:  None. IMPRESSION: No evidence of deep venous thrombosis in either lower extremity. Electronically Signed   By: Malachy Moan M.D.   On: 05/03/2023 06:27   DG Chest Port 1 View  Result Date: 05/03/2023 CLINICAL DATA:  Hypoxia EXAM: PORTABLE CHEST 1 VIEW COMPARISON:  Chest x-ray 07/08/2015 FINDINGS: Heart is borderline enlarged. There is no focal lung infiltrate, pleural effusion or pneumothorax. Right shoulder arthroplasty is present. No acute fractures are seen. IMPRESSION: Borderline cardiomegaly. No active disease. Electronically Signed   By: Darliss Cheney M.D.   On: 05/03/2023 00:54   CT  ABDOMEN PELVIS WO CONTRAST  Result Date: 05/02/2023 CLINICAL DATA:  Acute nonlocalized abdominal pain. Myelofibrosis, chronic renal insufficiency EXAM: CT ABDOMEN AND PELVIS WITHOUT CONTRAST TECHNIQUE: Multidetector CT imaging of the abdomen and pelvis was performed following the standard protocol without IV contrast. RADIATION DOSE REDUCTION: This exam was performed according to the departmental dose-optimization program which includes automated exposure control, adjustment of the mA and/or kV according to patient size and/or use of iterative reconstruction technique. COMPARISON:  11/14/2016 FINDINGS: Lower chest: Small right and trace left pleural effusions are present associated bibasilar atelectasis. Small pericardial effusion. Global cardiac size is mildly enlarged. Hypoattenuation of the cardiac blood pool is in keeping with at least mild anemia. Hepatobiliary: Mild periportal edema. No intra or extrahepatic biliary ductal dilation. Liver otherwise unremarkable on this noncontrast examination. Gallbladder unremarkable. Pancreas: Unremarkable. No pancreatic ductal dilatation or surrounding inflammatory changes.  Spleen: Moderate splenomegaly noted with the spleen measuring a 2.6 cm in greatest dimension. No definite intrasplenic lesion seen on this noncontrast examination. Adrenals/Urinary Tract: The adrenal glands are unremarkable. There has, however, developed infiltration within the retroperitoneum surrounding the adrenal glands reflecting retroperitoneal edema as well as shotty retroperitoneal adenopathy within the periportal, left periaortic, and aortocaval lymph node groups, possibly reactive in nature or indicative of a low-grade lymphoproliferative process. Numerous simple and minimally complex renal cysts are seen bilaterally for which no follow-up imaging is recommended. No urinary renal or ureteral calculi. No hydronephrosis. The bladder is unremarkable save for a small amount dependently layering  excreted contrast. Stomach/Bowel: Pancolonic diverticulosis, severe within the sigmoid colon, without superimposed acute inflammatory change. Stomach, small-bowel, and large bowel are otherwise unremarkable. Appendix normal. No free intraperitoneal gas or fluid. Vascular/Lymphatic: Extensive aortoiliac atherosclerotic calcification. No aortic aneurysm. Near occlusive atherosclerotic calcification left common femoral artery. Aside from a previously mentioned shotty adenopathy within the upper retroperitoneum, no pathologic adenopathy within the abdomen and pelvis. Reproductive: Prostate is unremarkable. Other: Interval development of diffuse subcutaneous body wall edema. No abdominal wall hernia Musculoskeletal: The osseous structures are diffusely mildly sclerotic in keeping with the given history of myelofibrosis. No acute bone abnormality. No focal lytic or blastic bone lesion. Osseous structures are otherwise age-appropriate. IMPRESSION: 1. Interval development of small right and trace left pleural effusions, small pericardial effusion, retroperitoneal edema and diffuse subcutaneous body wall edema, in keeping with anasarca. 2. Stable moderate splenomegaly. Diffuse osseous sclerosis. Findings are in keeping with changes of myelofibrosis. 3. Interval development of shotty retroperitoneal adenopathy within the periportal, left periaortic, and aortocaval lymph node groups, possibly reactive in nature or indicative of a low-grade lymphoproliferative process. 4. Pancolonic diverticulosis, severe within the sigmoid colon, without superimposed acute inflammatory change. Aortic Atherosclerosis (ICD10-I70.0). Electronically Signed   By: Helyn Numbers M.D.   On: 05/02/2023 22:51   CARDIAC CATHETERIZATION  Result Date: 05/02/2023 Conclusions: Mild, non-obstructive coronary artery disease. Mildly elevated left ventricular filling pressure. Bedside ultrasound with mildly reduced LVEF with global hypokinesis and small  pericardial effusion.  Query pericarditis/myocarditis. Recommendations: Admit to stepdown. Start amiodarone bolus/infusion for atrial fibrillation with rapid ventricular response. PRBC transfusion for target hemoglobin greater than 8. If hemoglobin stable and no enlargement of pericardial effusion, will need to consider heparin infusion in the setting of a-fib with RVR. Yvonne Kendall, MD [facility]    Medications:    sodium chloride     ceFEPime (MAXIPIME) IV Stopped (05/02/23 2250)   phenylephrine (NEO-SYNEPHRINE) Adult infusion 50 mcg/min (05/03/23 1346)   sodium bicarbonate 150 mEq in sterile water 1,150 mL infusion 75 mL/hr at 05/03/23 0339    sodium chloride   Intravenous Once   Chlorhexidine Gluconate Cloth  6 each Topical Daily   feeding supplement  237 mL Oral TID BM   heparin  5,000 Units Subcutaneous Q8H   levothyroxine  25 mcg Oral Q0600   lidocaine  1 patch Transdermal Q24H   midodrine  10 mg Oral TID WC   multivitamin  1 tablet Per Tube QHS   pantoprazole  40 mg Oral Daily   predniSONE  20 mg Oral Q breakfast   simvastatin  40 mg Oral Daily   sodium chloride flush  3 mL Intravenous Q12H   vancomycin variable dose per unstable renal function (pharmacist dosing)   Does not apply See admin instructions   sodium chloride, acetaminophen, docusate sodium, melatonin, ondansetron (ZOFRAN) IV, mouth rinse, polyethylene glycol, sodium chloride flush  Assessment/ Plan:  82 y.o. 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  Baseline creatinine of 2.5/GFR 25 on April 21, 2023 AKI is likely multifactorial.  Urinalysis from May 02, 2023 shows  proteinuria, negative for blood.  0-5 RBCs.  0-5 WBCs.  Low likelihood for acute glomerulonephritis or acute interstitial nephritis. He did present with hypotension which may have led to ATN induced AKI.  Situation was likely exacerbated by IV contrast exposure from cardiac catheterization. Patient has now become oliguric. His electrolytes and volume status are acceptable at present although tenuous. No acute indication for dialysis at present but will follow closely on a daily basis. -Maintain hemodynamic stability and avoid hypotension. -Avoid IV contrast, nonsteroidals. -Agree to hold hydralazine, Marcelline Deist.  #Acute pericarditis Possibility of hydralazine induced autoimmune response. ANA, complements are pending. Other differential includes viral myocarditis versus other causes. Currently getting treatment with steroids.  He was given a dose of colchicine 0.3 mg yesterday. Low likelihood of uremic pericarditis in the setting of relatively recent onset of AKI  #Hyperkalemia Currently being managed with IV bicarbonate infusion and Lokelma. IV bicarbonate drip to be discontinued to avoid volume overload. Lokelma can be given as needed.  #Proteinuria Patient is noted to have proteinuria even as outpatient. Previous autoimmune workup in June 2024 was negative. Urine protein to creatinine ratio 4.7 g In October 2024. He has known numerous simple and minimally complex renal cysts bilaterally.  CT abdomen from May 02, 2023 is negative for ureteral calculi, hydronephrosis.    LOS: 1 Cleopha Indelicato 11/27/20243:35 PM  [facility] [place], [place] [phone]  Note: This note was prepared with Dragon dictation. Any transcription errors are unintentional

## 2023-05-03 NOTE — Evaluation (Signed)
Occupational Therapy Evaluation Patient Details Name: [name] MRN: [id number] DOB: 09/10/1940 Today's Date: 05/03/2023   History of Present Illness SYIER BROUSE is a 82 y.o. male with medical history significant of chronic HFpEF, hypertension, hyperlipidemia, myelofibrosis, CKD stage IIIb, anemia of chronic disease, who presented initially to [facility] ED as a STEMI alert.   Clinical Impression   Pt was seen for OT evaluation this date. Prior to hospital admission, pt was IND with ADLs but limited with RUE due to pain prior to surgery. Pt lives with wife. Pt presents to acute OT demonstrating impaired ADL performance and functional mobility 2/2 (See OT problem list for additional functional deficits). Upon arrival to room pt supine in bed, agreeable to tx. Pt's wife present during the entire session. Pt remained in bed moved into chair position, per RN pt unable to tolerate sitting at the EOB at this time. Pt completed a exercise program for RUE including forearm pronation and supination, wrist extension and flexion, digit flexion and extension, and elbow flexion. Pt completed 10 reps of each with Min A and reviewed exercise program with wife, wife demonstrated understanding. Pt required hand over hand for exercises. Pt reported 7/10 pain from the elbow down to the hand. Pt left supine with all needs met and RN in the room. Pt would benefit from skilled OT services to address noted impairments and functional limitations (see below for any additional details) in order to maximize safety and independence while minimizing falls risk and caregiver burden. Anticipate the need for follow up OT services upon acute hospital DC.        If plan is discharge home, recommend the following: A little help with walking and/or transfers;A lot of help with bathing/dressing/bathroom;Assistance with cooking/housework;Assist for transportation;Help with stairs or ramp for entrance;Direct supervision/assist for  medications management    Functional Status Assessment  Patient has had a recent decline in their functional status and demonstrates the ability to make significant improvements in function in a reasonable and predictable amount of time.  Equipment Recommendations  Other (comment)    Recommendations for Other Services       Precautions / Restrictions Precautions Precautions: Fall;Shoulder Type of Shoulder Precautions: shoulder abd pillow at all times Shoulder Interventions: Shoulder sling/immobilizer;Shoulder abduction pillow;At all times;Off for dressing/bathing/exercises Restrictions Weight Bearing Restrictions: No RUE Weight Bearing: Non weight bearing Other Position/Activity Restrictions: elbow/wrist/hand ROM ok      Mobility Bed Mobility               General bed mobility comments: Pt remained in bed moved into chair position, per RN pt unable to tolerate sitting at the EOB at this time. Patient Response: Cooperative  Transfers                          Balance                                           ADL either performed or assessed with clinical judgement   ADL Overall ADL's : Needs assistance/impaired                                     Functional mobility during ADLs: Minimal assistance General ADL Comments: Pt completed a exercise program for RUE including forearm  pronation and supination, wrist extension and flexion, digit flexion and extension, and elbow flexion. Pt completed 10 reps of each with Min A and reviewed exercise program with wife, wife demonstrated understanding. Pt required hand over hand for exercises. Pt reported 7/10 pain from the elbow down to the hand.     Vision         Perception         Praxis         Pertinent Vitals/Pain Pain Assessment Pain Assessment: Faces Faces Pain Scale: Hurts even more Pain Location: R elbow down to the hand Pain Descriptors / Indicators: Aching,  Grimacing, Discomfort, Tightness, Radiating Pain Intervention(s): Limited activity within patient's tolerance, Monitored during session, Repositioned     Extremity/Trunk Assessment Upper Extremity Assessment Upper Extremity Assessment: Generalized weakness;Right hand dominant;RUE deficits/detail RUE Deficits / Details: s/p rev TSA RUE Coordination: decreased fine motor;decreased gross motor   Lower Extremity Assessment Lower Extremity Assessment: Generalized weakness;LLE deficits/detail LLE Deficits / Details: left knee arthrocentesis       Communication Communication Communication: Hearing impairment Cueing Techniques: Verbal cues;Tactile cues   Cognition Arousal: Alert Behavior During Therapy: WFL for tasks assessed/performed Overall Cognitive Status: Within Functional Limits for tasks assessed                                 General Comments: Alert and oriented x4  throughout     General Comments       Exercises     Shoulder Instructions      Home Living Family/patient expects to be discharged to:: Private residence Living Arrangements: Spouse/significant other Available Help at Discharge: Family;Available 24 hours/day;Available PRN/intermittently Type of Home: House Home Access: Stairs to enter [employer] of Steps: 4 Entrance Stairs-Rails: Right;Left Home Layout: Multi-level;Able to live on main level with bedroom/bathroom     Bathroom Shower/Tub: Producer, television/film/video: Handicapped height     Home Equipment: Other (comment)   Additional Comments: *wide base QC      Prior Functioning/Environment Prior Level of Function : Independent/Modified Independent;Driving;History of Falls (last six months)             Mobility Comments: WB QC but reports he can walk without it, driving, active per pt report; Pt reports 5-6 falls in past 6 months ADLs Comments: Indep with ADLs but limited with RUE due to pain prior to  surgery        OT Problem List: Impaired UE functional use;Decreased knowledge of precautions;Impaired balance (sitting and/or standing);Decreased knowledge of use of DME or AE;Decreased range of motion;Impaired sensation;Decreased strength;Decreased coordination;Decreased activity tolerance      OT Treatment/Interventions: Self-care/ADL training;Therapeutic exercise;Patient/family education;Balance training;Energy conservation;DME and/or AE instruction;Therapeutic activities    OT Goals(Current goals can be found in the care plan section) Acute Rehab OT Goals Patient Stated Goal: to get better OT Goal Formulation: With patient/family Time For Goal Achievement: 05/17/23 Potential to Achieve Goals: Good  OT Frequency: Min 1X/week    Co-evaluation              AM-PAC OT "6 Clicks" Daily Activity     Outcome Measure Help from another person eating meals?: A Little Help from another person taking care of personal grooming?: A Little Help from another person toileting, which includes using toliet, bedpan, or urinal?: A Little Help from another person bathing (including washing, rinsing, drying)?: A Lot Help from another person to put on and  taking off regular upper body clothing?: A Lot Help from another person to put on and taking off regular lower body clothing?: A Lot 6 Click Score: 15   End of Session Nurse Communication: Mobility status  Activity Tolerance: Patient tolerated treatment well Patient left: in bed;with bed alarm set;with call bell/phone within reach;with family/visitor present  OT Visit Diagnosis: Other abnormalities of gait and mobility (R26.89);Repeated falls (R29.6);Muscle weakness (generalized) (M62.81)                Time: 5409-8119 OT Time Calculation (min): 25 min Charges:     Butch Penny, SOT

## 2023-05-03 NOTE — Progress Notes (Signed)
OT Cancellation Note  Patient Details Name: [name] MRN: [id number] DOB: [birth year]   Cancelled Treatment:    Reason Eval/Treat Not Completed: Medical issues which prohibited therapy. OT order received and chart reviewed. Per chart, RN requesting therapy hold secondary to medical concerns. OT will re-attempt when next available.   Jackquline Denmark, MS, OTR/L , CBIS ascom [phone]  05/03/23, 9:45 AM

## 2023-05-03 NOTE — Progress Notes (Signed)
Pharmacy Antibiotic Note  [name] is a 82 y.o. male w/ PMH of chronic HFpEF, hypertension, hyperlipidemia, myelofibrosis, CKD stage IIIb, anemia of chronic disease  admitted on 05/02/2023 with sepsis.  Pharmacy has been consulted for vancomycin and cefepime dosing.  Plan:  1) s/p vancomycin 1750 mg IV x1.  ---Check random vanc level tomorrow given AKI (SCr 4.88, baseline 2.3-2.5) ---variable vancomycin dose place-holder  2) continue cefepime 2 g IV Q24H Continue to monitor renal function and follow culture results   Height: 5\' 10"  (177.8 cm) Weight: 86.7 kg (191 lb 2.2 oz) IBW/kg (Calculated) : 73  Temp (24hrs), Avg:98 F (36.7 C), Min:97.5 F (36.4 C), Max:98.5 F (36.9 C)  Recent Labs  Lab 05/02/23 1516 05/02/23 2205 05/03/23 0532  WBC 16.3* 16.9* 16.1*  CREATININE 4.52* 4.78* 4.91*  LATICACIDVEN  --  1.1  --     Estimated Creatinine Clearance: 12 mL/min (A) (by C-G formula based on SCr of 4.91 mg/dL (H)).    Allergies  Allergen Reactions   Meloxicam Nausea And Vomiting    Antimicrobials this admission: 11/26 vancomycin >>  11/26 cefepime >>   Microbiology results: 11/26 MRSA PCR: Not detected 11/26 Resp panel: negative 11/26 BCx NGTD  Thank you for allowing pharmacy to be a part of this patient's care.  Lowella Bandy, PharmD Clinical Pharmacist 05/03/2023 8:07 AM

## 2023-05-03 NOTE — Progress Notes (Signed)
*  PRELIMINARY RESULTS* Echocardiogram 2D Echocardiogram has been performed.  Carolyne Fiscal 05/03/2023, 3:45 PM

## 2023-05-03 NOTE — Progress Notes (Signed)
NAME:  [name], MRN:  [id number], DOB:  1941-02-08, LOS: 1 ADMISSION DATE:  05/02/2023  History of Present Illness:  Mr. Stettler is an 82 year old male patient with a past medical history of myelofibrosis confirmed by bone marrow biopsy in 2016 follows with the cancer center, CKD stage IIIb, hypertension, hyperlipidemia, PAD and history of CVA who presented to [facility] on 11/26 with chest pain and A-fib with RVR.  He was found to have ST elevations in the anterolateral leads and inferior leads currently status post left heart cath on 11/26 with nonobstructive coronary artery disease.  Presentation thought to be secondary to pericarditis and was started on prednisone.  Furthermore course complicated by shock requiring neosynephrine and he was noted to have elevated white count and therefore was broadly cultured and started on vancomycin and cefepime.  Finally he was noted to be acute on chronic renal failure with creatinine peaking at 4.9 mg/dL.  Potassium stable at 5.5 mEq.  Acidosis noted with bicarb of 16.  Started on bicarb drip overnight.  CT abdomen pelvis 11/26 with small right and left pleural effusion and small pericardial effusion.  Anasarca was also noted.  Stable moderate splenomegaly.  Retroperitoneal adenopathy.  Pertinent  Medical History  As per HPI.  Significant Hospital Events: Including procedures, antibiotic start and stop dates in addition to other pertinent events   -- s/p LHC 11/27 NOCAD. ICU admit on Neo.   Interim History / Subjective:  This AM patient feeling generally well has no major complaints. Left arm swelling noted.   Objective   Blood pressure 106/63, pulse 85, temperature 98.2 F (36.8 C), temperature source Axillary, resp. rate 18, height 5\' 10"  (1.778 m), weight 86.7 kg, SpO2 97%.        Intake/Output Summary (Last 24 hours) at 05/03/2023 1438 Last data filed at 05/03/2023 1352 Gross per 24 hour  Intake 4190.69 ml  Output 395 ml  Net 3795.69 ml    Filed Weights   05/02/23 1520 05/03/23 0315  Weight: 81.6 kg 86.7 kg    Examination: General: NAD HENT: Supple neck, reactive pupils  Lungs: Clear bilateral air entry  Cardiovascular: Normal S1, Normal S2, irregularly irregular rhythm Abdomen: soft, mildly tender Extremities: Warm and well perfused   Labs and imaging were reviewed.   Assessment & Plan:  Mr. Haag is an 82 year old male patient with a past medical history of myelofibrosis confirmed by bone marrow biopsy in 2016 follows with the cancer center, CKD stage IIIb, hypertension, hyperlipidemia, PAD and history of CVA who presented to [facility] on 11/26 with chest pain and A-fib with RVR. Found to have ST elevations in the anterolateral and inferior leads. S/p LHC with NOCAD 11/26. Course c/b Acute on chronic anemia s/p 2 PRBC transfusions. Also c/b Acute on chronic renal failure with Cr 4.8 mg/dl now on bicarb dri. [place]cok on Neosynephrine  #Shock likely distributive in the setting of proinflammatory state/Myopericarditis - CXR negative - CT a/p non revealing - UA negative -  blood cultures pending. Right shoulder without erythema or tenderness. #NOCAD with ST elevations in the anterolateral and inferior leads likely myopericarditis. Unclear reason, Resp viral panel negative.  #Acute on chronic anemia likely reactive in the setting of proinflammatory state on a background of myleodysplastic syndrome.  #Acute on chronic renal failure with cr 4.8mg /dl from a baseline of 2.0mg /dl pre-renal ATN. She was started on hydralazine 2 weeks ago which raises the concern for an autoimmune lupus like picture leading to serositis and AKI.  #  S/p Right shoulder replacement 2 weeks ago now with lymphedema.   Neuro: Tylenol for analgesia.  [facility]: Neo for MAPS > 65. Pending Echocardiogram. C/w Prednisone 20mg  Daily. Unable to do colchicine given cardiac function. C/w Simvastatin 40mg  PO daily.  Lungs: No issues.  ID: Continue with broad spectrum abx for  now. Procal in the am.  GI: Cardiac diet. PRN laxatives, no bowel movements in 5 days. PO Pantoprazole. Renal: C/w bicarb drip for now. Monitor urine output. Pending ANA.  Endo: POC 140-180. Continue with levothyroxine.   Best Practice (right click and "Reselect all SmartList Selections" daily)   Diet/type: Regular consistency (see orders) DVT prophylaxis: heparin injection 5,000 Units Start: 05/02/23 2200 SCD's Start: 05/02/23 1715   Pressure ulcer(s): not present on admission  GI prophylaxis: PPI Lines: N/A Foley:  Yes, and it is still needed Code Status:  full code  Last date of multidisciplinary goals of care discussion [05/03/2023]  I spent 70 minutes caring for this patient today, including preparing to see the patient, obtaining a medical history , reviewing a separately obtained history, performing a medically appropriate examination and/or evaluation, counseling and educating the patient/family/caregiver, and referring and communicating with other health care professionals (not separately reported).  Janann Colonel, MD [facility] 05/03/2023 4:11 PM

## 2023-05-03 NOTE — Progress Notes (Signed)
Rounding Note    Patient Name: [name] Date of Encounter: 05/03/2023  [facility] Cardiologist: [facility]  Subjective   Reports feeling better this morning, Chest and abdominal pain much improved At home reports having sharp knifelike pain in his abdomen and chest Cardiac catheterization May 02, 2023 with nonobstructive coronary disease Was started on prednisone out of concern for pericarditis Not a candidate for NSAIDs or colchicine given acute on chronic renal failure creatinine greater than 4 Remains on bicarb infusion  CT scan abdomen pelvis small right and left pleural effusion, small pericardial effusion anasarca noted, moderate splenomegaly, retroperitoneal adenopathy  Family at the bedside, wife in a wheelchair at the bedside, son and grandson  Inpatient Medications    Scheduled Meds:  sodium chloride   Intravenous Once   Chlorhexidine Gluconate Cloth  6 each Topical Daily   feeding supplement  237 mL Oral TID BM   heparin  5,000 Units Subcutaneous Q8H   levothyroxine  25 mcg Oral Q0600   lidocaine  1 patch Transdermal Q24H   multivitamin  1 tablet Per Tube QHS   pantoprazole  40 mg Oral Daily   predniSONE  20 mg Oral Q breakfast   simvastatin  40 mg Oral Daily   sodium chloride flush  3 mL Intravenous Q12H   vancomycin variable dose per unstable renal function (pharmacist dosing)   Does not apply See admin instructions   Continuous Infusions:  sodium chloride     ceFEPime (MAXIPIME) IV Stopped (05/02/23 2250)   phenylephrine (NEO-SYNEPHRINE) Adult infusion 50 mcg/min (05/03/23 1346)   sodium bicarbonate 150 mEq in sterile water 1,150 mL infusion 75 mL/hr at 05/03/23 1541   PRN Meds: sodium chloride, acetaminophen, docusate sodium, melatonin, ondansetron (ZOFRAN) IV, mouth rinse, polyethylene glycol, sodium chloride flush   Vital Signs    Vitals:   05/03/23 1030 05/03/23 1045 05/03/23 1200 05/03/23 1330  BP: (!) 109/58 99/67  107/66 106/63  Pulse: 95 87 84 85  Resp: 15 14 13 18   Temp:   98.2 F (36.8 C)   TempSrc:   Axillary   SpO2: 95% 95% 96% 97%  Weight:      Height:        Intake/Output Summary (Last 24 hours) at 05/03/2023 1707 Last data filed at 05/03/2023 1637 Gross per 24 hour  Intake 4190.69 ml  Output 425 ml  Net 3765.69 ml      05/03/2023    3:15 AM 05/02/2023    3:20 PM 04/21/2023    4:53 AM  Last 3 Weights  Weight (lbs) 191 lb 2.2 oz 180 lb 191 lb 9.3 oz  Weight (kg) 86.7 kg 81.647 kg 86.9 kg      Telemetry    Normal sinus rhythm- Personally Reviewed  ECG     - Personally Reviewed  Physical Exam   GEN: No acute distress.   Neck: No JVD Cardiac: RRR, no murmurs, rubs, or gallops.  Respiratory: Clear to auscultation bilaterally. GI: Soft, nontender, non-distended  MS: No edema; No deformity. Neuro:  Nonfocal  Psych: Normal affect   Labs    High Sensitivity Troponin:   Recent Labs  Lab 05/02/23 1516 05/02/23 1725  TROPONINIHS 32* 41*     Chemistry Recent Labs  Lab 05/02/23 1516 05/02/23 2205 05/03/23 0532 05/03/23 1115  NA 134* 133* 133* 133*  K 5.5* 5.5* 5.5* 5.2*  CL 105 105 105 103  CO2 17* 16* 19* 18*  GLUCOSE 130* 124* 152* 124*  BUN  63* 68* 70* 74*  CREATININE 4.52* 4.78* 4.91* 4.88*  CALCIUM 7.2* 7.2* 6.9* 6.9*  MG  --  1.9 2.0  --   PROT 5.7* 5.5*  --   --   ALBUMIN 2.6* 2.4*  --  2.3*  AST 13* 12*  --   --   ALT 13 13  --   --   ALKPHOS 116 111  --   --   BILITOT 0.7 0.9  --   --   GFRNONAA 12* 11* 11* 11*  ANIONGAP 12 12 9 12     Lipids  Recent Labs  Lab 05/02/23 1516  CHOL 84  TRIG 68  HDL 23*  LDLCALC 47  CHOLHDL 3.7    Hematology Recent Labs  Lab 05/02/23 1516 05/02/23 2205 05/03/23 0256 05/03/23 0532 05/03/23 1326  WBC 16.3* 16.9*  --  16.1*  --   RBC 2.42* 2.57*  --  2.73*  2.74*  --   HGB 7.0* 7.3* 7.7* 7.8* 8.1*  HCT 22.8* 24.0* 24.3* 24.9* 25.6*  MCV 94.2 93.4  --  91.2  --   MCH 28.9 28.4  --  28.6  --    MCHC 30.7 30.4  --  31.3  --   RDW 18.5* 19.1*  --  19.6*  --   PLT 363 363  --  342  --    Thyroid  Recent Labs  Lab 05/02/23 2205  TSH 4.949*  FREET4 0.86    BNP Recent Labs  Lab 05/02/23 2205  BNP 349.9*    DDimer No results for input(s): "DDIMER" in the last 168 hours.   Radiology    US Venous Img Upper Uni Right(DVT)  Result Date: 05/03/2023 CLINICAL DATA:  Right upper extremity pain and edema. Evaluate for DVT. EXAM: RIGHT UPPER EXTREMITY VENOUS DOPPLER ULTRASOUND TECHNIQUE: Gray-scale sonography with graded compression, as well as color Doppler and duplex ultrasound were performed to evaluate the upper extremity deep venous system from the level of the subclavian vein and including the jugular, axillary, basilic, radial, ulnar and upper cephalic vein. Spectral Doppler was utilized to evaluate flow at rest and with distal augmentation maneuvers. COMPARISON:  None Available. FINDINGS: Contralateral Subclavian Vein: Respiratory phasicity is normal and symmetric with the symptomatic side. No evidence of thrombus. Normal compressibility. Internal Jugular Vein: No evidence of thrombus. Normal compressibility, respiratory phasicity and response to augmentation. Subclavian Vein: No evidence of thrombus. Normal compressibility, respiratory phasicity and response to augmentation. Axillary Vein: No evidence of thrombus. Normal compressibility, respiratory phasicity and response to augmentation. Cephalic Vein: No evidence of thrombus. Normal compressibility, respiratory phasicity and response to augmentation. Basilic Vein: No evidence of thrombus. Normal compressibility, respiratory phasicity and response to augmentation. Brachial Veins: No evidence of thrombus. Normal compressibility, respiratory phasicity and response to augmentation. Radial Veins: No evidence of thrombus. Normal compressibility, respiratory phasicity and response to augmentation. Ulnar Veins: No evidence of thrombus. Normal  compressibility, respiratory phasicity and response to augmentation. Other Findings: There is a minimal amount of subcutaneous edema at the level of the right upper arm and forearm. IMPRESSION: No evidence of DVT within the right upper extremity. Electronically Signed   By: Simonne Come M.D.   On: 05/03/2023 12:55   US Venous Img Lower Bilateral (DVT)  Result Date: 05/03/2023 EXAM: BILATERAL LOWER EXTREMITY VENOUS DOPPLER ULTRASOUND TECHNIQUE: Gray-scale sonography with graded compression, as well as color Doppler and duplex ultrasound were performed to evaluate the lower extremity deep venous systems from the level of the common  femoral vein and including the common femoral, femoral, profunda femoral, popliteal and calf veins including the posterior tibial, peroneal and gastrocnemius veins when visible. The superficial great saphenous vein was also interrogated. Spectral Doppler was utilized to evaluate flow at rest and with distal augmentation maneuvers in the common femoral, femoral and popliteal veins. COMPARISON:  None Available. FINDINGS: RIGHT LOWER EXTREMITY Common Femoral Vein: No evidence of thrombus. Normal compressibility, respiratory phasicity and response to augmentation. Saphenofemoral Junction: No evidence of thrombus. Normal compressibility and flow on color Doppler imaging. Profunda Femoral Vein: No evidence of thrombus. Normal compressibility and flow on color Doppler imaging. Femoral Vein: No evidence of thrombus. Normal compressibility, respiratory phasicity and response to augmentation. Popliteal Vein: No evidence of thrombus. Normal compressibility, respiratory phasicity and response to augmentation. Calf Veins: No evidence of thrombus. Normal compressibility and flow on color Doppler imaging. Superficial Great Saphenous Vein: No evidence of thrombus. Normal compressibility. Venous Reflux:  None. Other Findings:  None. LEFT LOWER EXTREMITY Common Femoral Vein: No evidence of thrombus.  Normal compressibility, respiratory phasicity and response to augmentation. Saphenofemoral Junction: No evidence of thrombus. Normal compressibility and flow on color Doppler imaging. Profunda Femoral Vein: No evidence of thrombus. Normal compressibility and flow on color Doppler imaging. Femoral Vein: No evidence of thrombus. Normal compressibility, respiratory phasicity and response to augmentation. Popliteal Vein: No evidence of thrombus. Normal compressibility, respiratory phasicity and response to augmentation. Calf Veins: No evidence of thrombus. Normal compressibility and flow on color Doppler imaging. Superficial Great Saphenous Vein: No evidence of thrombus. Normal compressibility. Venous Reflux:  None. Other Findings:  None. IMPRESSION: No evidence of deep venous thrombosis in either lower extremity. Electronically Signed   By: Malachy Moan M.D.   On: 05/03/2023 06:27   DG Chest Port 1 View  Result Date: 05/03/2023 CLINICAL DATA:  Hypoxia EXAM: PORTABLE CHEST 1 VIEW COMPARISON:  Chest x-ray 07/08/2015 FINDINGS: Heart is borderline enlarged. There is no focal lung infiltrate, pleural effusion or pneumothorax. Right shoulder arthroplasty is present. No acute fractures are seen. IMPRESSION: Borderline cardiomegaly. No active disease. Electronically Signed   By: Darliss Cheney M.D.   On: 05/03/2023 00:54   CT ABDOMEN PELVIS WO CONTRAST  Result Date: 05/02/2023 CLINICAL DATA:  Acute nonlocalized abdominal pain. Myelofibrosis, chronic renal insufficiency EXAM: CT ABDOMEN AND PELVIS WITHOUT CONTRAST TECHNIQUE: Multidetector CT imaging of the abdomen and pelvis was performed following the standard protocol without IV contrast. RADIATION DOSE REDUCTION: This exam was performed according to the departmental dose-optimization program which includes automated exposure control, adjustment of the mA and/or kV according to patient size and/or use of iterative reconstruction technique. COMPARISON:   11/14/2016 FINDINGS: Lower chest: Small right and trace left pleural effusions are present associated bibasilar atelectasis. Small pericardial effusion. Global cardiac size is mildly enlarged. Hypoattenuation of the cardiac blood pool is in keeping with at least mild anemia. Hepatobiliary: Mild periportal edema. No intra or extrahepatic biliary ductal dilation. Liver otherwise unremarkable on this noncontrast examination. Gallbladder unremarkable. Pancreas: Unremarkable. No pancreatic ductal dilatation or surrounding inflammatory changes. Spleen: Moderate splenomegaly noted with the spleen measuring a 2.6 cm in greatest dimension. No definite intrasplenic lesion seen on this noncontrast examination. Adrenals/Urinary Tract: The adrenal glands are unremarkable. There has, however, developed infiltration within the retroperitoneum surrounding the adrenal glands reflecting retroperitoneal edema as well as shotty retroperitoneal adenopathy within the periportal, left periaortic, and aortocaval lymph node groups, possibly reactive in nature or indicative of a low-grade lymphoproliferative process. Numerous simple and minimally complex renal  cysts are seen bilaterally for which no follow-up imaging is recommended. No urinary renal or ureteral calculi. No hydronephrosis. The bladder is unremarkable save for a small amount dependently layering excreted contrast. Stomach/Bowel: Pancolonic diverticulosis, severe within the sigmoid colon, without superimposed acute inflammatory change. Stomach, small-bowel, and large bowel are otherwise unremarkable. Appendix normal. No free intraperitoneal gas or fluid. Vascular/Lymphatic: Extensive aortoiliac atherosclerotic calcification. No aortic aneurysm. Near occlusive atherosclerotic calcification left common femoral artery. Aside from a previously mentioned shotty adenopathy within the upper retroperitoneum, no pathologic adenopathy within the abdomen and pelvis. Reproductive: Prostate  is unremarkable. Other: Interval development of diffuse subcutaneous body wall edema. No abdominal wall hernia Musculoskeletal: The osseous structures are diffusely mildly sclerotic in keeping with the given history of myelofibrosis. No acute bone abnormality. No focal lytic or blastic bone lesion. Osseous structures are otherwise age-appropriate. IMPRESSION: 1. Interval development of small right and trace left pleural effusions, small pericardial effusion, retroperitoneal edema and diffuse subcutaneous body wall edema, in keeping with anasarca. 2. Stable moderate splenomegaly. Diffuse osseous sclerosis. Findings are in keeping with changes of myelofibrosis. 3. Interval development of shotty retroperitoneal adenopathy within the periportal, left periaortic, and aortocaval lymph node groups, possibly reactive in nature or indicative of a low-grade lymphoproliferative process. 4. Pancolonic diverticulosis, severe within the sigmoid colon, without superimposed acute inflammatory change. Aortic Atherosclerosis (ICD10-I70.0). Electronically Signed   By: Helyn Numbers M.D.   On: 05/02/2023 22:51   CARDIAC CATHETERIZATION  Result Date: 05/02/2023 Conclusions: Mild, non-obstructive coronary artery disease. Mildly elevated left ventricular filling pressure. Bedside ultrasound with mildly reduced LVEF with global hypokinesis and small pericardial effusion.  Query pericarditis/myocarditis. Recommendations: Admit to stepdown. Start amiodarone bolus/infusion for atrial fibrillation with rapid ventricular response. PRBC transfusion for target hemoglobin greater than 8. If hemoglobin stable and no enlargement of pericardial effusion, will need to consider heparin infusion in the setting of a-fib with RVR. Yvonne Kendall, MD [facility]   Cardiac Studies   Echocardiogram  Patient Profile     [name] is a 82 y.o. male with a hx of hypertension, hyperlipidemia, PAD, stroke, myelofibrosis with chronic anemia,  and chronic kidney disease stage III-IV  who is being seen 05/02/2023 for the evaluation of chest pain and abnormal EKG   Assessment & Plan    Chest pain/abdominal pain/pericarditis Also presenting with weakness, Given abnormal EKG taken to the catheterization lab showing nonobstructive disease Concern for acute pericarditis in the setting of uremia, acute on chronic renal failure -Not a candidate for NSAIDs and colchicine in the setting of acute on chronic kidney disease.  He did receive dose of colchicine yesterday -- Elevated sed rate 127, elevated CRP 26 -On prednisone 20 daily  Atrial fibrillation with RVR Initially placed on amiodarone, heparin deferred in the setting of anemia Not felt to be a candidate for long-term anticoagulation given anemia -Remains in rate controlled atrial fibrillation, options limited given hypotension  Anemia Hemoglobin low but stable 8.1 Acute blood loss superimposed on chronic anemia from myelofibrosis and chronic renal failure -Consider transfusion for further drop in hemoglobin  Acute on chronic renal failure Creatinine greater than 4, baseline around 2 Nephrology following, felt to be multifactorial Exacerbated by IV contrast, hypotension Now oliguric Avoiding NSAIDs  Long discussion with patient, family at the bedside  For questions or updates, please contact [facility] Please consult [email or web address] for contact info under        Signed, Julien Nordmann, MD  05/03/2023, 5:07 PM

## 2023-05-03 NOTE — Hospital Course (Addendum)
[name] is a 82 y.o. male with medical history significant of chronic HFpEF, hypertension, hyperlipidemia, myelofibrosis, CKD stage IIIb, anemia of chronic disease, who presented initially to [facility] ED with chest pain and shortness of breath.  Troponin 41, EKG showed ST elevation.  Patient was taken to the Cath Lab, did not show any occlusion in coronary arteries.  Condition consistent with acute pericarditis.  Treated with colchicine, then changed to prednisone. Also has significant worsening renal function at the time of admission, associated with hyperkalemia.  Patient was given IV fluids, Lokelma.  Followed by nephrology. 11/28.  Patient developed atrial fibrillation, seen by cardiology, placed on heparin drip. 11/29.  Transfuse 1 unit of PRBC. 12/2.  Renal function significantly improved.  Pending rehab.

## 2023-05-03 NOTE — TOC Initial Note (Signed)
Transition of Care [facility]) - Initial/Assessment Note    Patient Details  Name: [name] MRN: [id number] Date of Birth: [birth year]  [facility]) CM/SW Contact:    Truddie Hidden, RN Phone Number: 05/03/2023, 3:46 PM  Clinical Narrative:   Admitted for: weakness, chest pain, abd, pain related to pericarditis Admitted from: Home with spouse  PCP: Laural Benes  Pharmacy: [facility] Current home health/prior home health/DME:quad cane Transportation: spouse  HH: Active with [facility]  [place] from [place] notified of patient admission.                      Expected Discharge Plan: Home/Self Care Barriers to Discharge: Continued Medical Work up   Patient Goals and CMS Choice Patient states their goals for this hospitalization and ongoing recovery are:: Home          Expected Discharge Plan and Services                                              Prior Living Arrangements/Services   Lives with:: Self, Spouse Patient language and need for interpreter reviewed:: Yes Do you feel safe going back to the place where you live?: Yes      Need for Family Participation in Patient Care: Yes (Comment) Care giver support system in place?: Yes (comment)   Criminal Activity/Legal Involvement Pertinent to Current Situation/Hospitalization: No - Comment as needed  Activities of Daily Living   ADL Screening (condition at time of admission) Independently performs ADLs?: No Does the patient have a NEW difficulty with bathing/dressing/toileting/self-feeding that is expected to last >3 days?: Yes (Initiates electronic notice to provider for possible OT consult) Does the patient have a NEW difficulty with getting in/out of bed, walking, or climbing stairs that is expected to last >3 days?: Yes (Initiates electronic notice to provider for possible PT consult) Does the patient have a NEW difficulty with communication that is expected to last >3 days?:  No Is the patient deaf or have difficulty hearing?: Yes Does the patient have difficulty seeing, even when wearing glasses/contacts?: No Does the patient have difficulty concentrating, remembering, or making decisions?: No  Permission Sought/Granted                  Emotional Assessment Appearance:: Appears stated age Attitude/Demeanor/Rapport: Engaged, Gracious Affect (typically observed): Accepting Orientation: : Oriented to Self, Oriented to Place, Oriented to  Time, Oriented to Situation Alcohol / Substance Use: Not Applicable Psych Involvement: No (comment)  Admission diagnosis:  Acute pericarditis [I30.9] Patient Active Problem List   Diagnosis Date Noted   Normocytic anemia 05/03/2023   Acute pericarditis 05/02/2023   Shock circulatory (HCC) 05/02/2023   Altered mental status 04/21/2023   Acute kidney injury superimposed on stage 3b chronic kidney disease (HCC) 04/17/2023   History of melanoma 04/17/2023   S/P reverse total shoulder arthroplasty, 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 04/17/2023   Hallucinations 04/17/2023   Hyperkalemia 04/17/2023   Acute pain of left knee 04/17/2023   Delirium 04/17/2023   Acute kidney injury (HCC) 04/17/2023   CKD stage 3b, GFR 30-44 ml/min (HCC) 09/13/2022   Cervical spondylosis 10/07/2021   Hypothyroidism 07/23/2021   Low vitamin B12 level 08/09/2020   Irritability 10/18/2019   Gastroesophageal reflux disease 03/13/2019   Acute on chronic anemia 07/04/2018   Leucopenia 02/05/2018   Supraspinatus tendon tear, right,  initial encounter 02/16/2017   Infraspinatus tendon tear, right, initial encounter 02/16/2017   Biceps tendon rupture, right, initial encounter 02/16/2017   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   Orthostatic hypotension 08/15/2016   Mixed hyperlipidemia 08/15/2016   Dizziness 12/30/2015   BPH (benign prostatic hyperplasia) 03/03/2015   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   Primary osteoarthritis of left knee 03/03/2015    H/O neoplasm 01/12/2015   History of nonmelanoma skin cancer 01/12/2015   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   Anemia complicating neoplastic disease 07/17/2014   Hepatosplenomegaly 07/17/2014   Neutropenia (HCC) 07/17/2014   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 [place] [address] Phone: [phone] Fax: [phone]  [facility]) - [place], [place] - [address] [facility] [address] [place] [address] [place] [address] Phone: [phone] Fax: [phone]     Social Determinants of Health (SDOH) Social History: SDOH Screenings   Food Insecurity: No Food Insecurity (05/02/2023)  Housing: Low Risk  (05/02/2023)  Transportation Needs: No Transportation Needs (05/02/2023)  Utilities: Not At Risk (05/02/2023)  Alcohol Screen: Low Risk  (07/12/2021)  Depression (PHQ2-9): Low Risk  (04/04/2023)  Financial Resource Strain: Low Risk  (07/12/2021)  Physical Activity: Inactive (07/12/2021)  Social Connections: Socially Integrated (07/12/2021)  Stress: No Stress Concern Present (07/12/2021)  Tobacco Use: Medium Risk (05/02/2023)   SDOH Interventions:     Readmission Risk Interventions    04/21/2023   10:16 AM  Readmission Risk Prevention Plan  Transportation Screening Complete  HRI or Home Care Consult Complete  Social Work Consult for Recovery Care Planning/Counseling Complete  Palliative Care Screening Not Applicable  Medication Review Oceanographer) Complete

## 2023-05-03 NOTE — Consult Note (Signed)
Hematology/Oncology Consult note [facility] [phone] [phone]  Patient Care Team: [name], DO as PCP - General (Family Medicine) Jolayne Panther, MD as Referring Physician (Hematology) Sherrie George, MD as Consulting Physician (Ophthalmology) Micki Riley, MD as Consulting Physician (Neurology) Jerilee Field, MD as Consulting Physician (Urology) Mariah Milling Tollie Pizza, MD as Consulting Physician (Cardiology) Riki Altes, MD (Urology) Creig Hines, MD as Consulting Physician (Hematology and Oncology)   Name of the patient: [name]  [id number]  07-May-1941    Reason for consult: History of myelofibrosis admitted for acute pancreatitis   Requesting physician: Dr. Lynden Ang  Date of visit: 05/03/2023    History of presenting illness-patient is a 82 year old male diagnosed with myelofibrosis back in 2016 confirmed on bone marrow biopsy back then.  He was CALR positive.  MDS FISH panel negative back then.  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  His hemoglobin has been mostly stable between 9-10 up until August 2024 when it drifted down to 8.4 and since then it has been in the eights.  Currently is admitted to the hospital with symptoms of chest pain and was evaluated by cardiology.  He underwent cardiac catheterization which showed mild nonobstructive CAD.  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  Baseline creatinine is around 2 and is presently elevated at 4.9.  Hematology consulted for anemia.     Review of systems- Review of Systems  Constitutional:  Positive for malaise/fatigue. Negative for chills, fever and weight loss.  HENT:  Negative for congestion, ear discharge and nosebleeds.   Eyes:  Negative for blurred vision.  Respiratory:  Negative for cough, hemoptysis, sputum production,  shortness of breath and wheezing.   Cardiovascular:  Negative for chest pain, palpitations, orthopnea and claudication.  Gastrointestinal:  Negative for abdominal pain, blood in stool, constipation, diarrhea, heartburn, melena, nausea and vomiting.  Genitourinary:  Negative for dysuria, flank pain, frequency, hematuria and urgency.  Musculoskeletal:  Negative for back pain, joint pain and myalgias.  Skin:  Negative for rash.  Neurological:  Negative for dizziness, tingling, focal weakness, seizures, weakness and headaches.  Endo/Heme/Allergies:  Does not bruise/bleed easily.  Psychiatric/Behavioral:  Negative for depression and suicidal ideas. The patient does not have insomnia.     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 04/17/2023   Hallucinations 04/17/2023   Hyperkalemia 04/17/2023   Acute pain of left knee 04/17/2023   Delirium 04/17/2023   Acute kidney injury (HCC) 04/17/2023   CKD stage 3b, GFR 30-44 ml/min (HCC) 09/13/2022   Cervical spondylosis 10/07/2021   Hypothyroidism 07/23/2021   Low vitamin B12 level 08/09/2020   Irritability 10/18/2019   Gastroesophageal reflux disease 03/13/2019   Acute on chronic anemia 07/04/2018   Leucopenia 02/05/2018   Supraspinatus tendon tear, right, initial encounter 02/16/2017   Infraspinatus tendon tear, right, initial encounter 02/16/2017   Biceps tendon rupture, right, initial encounter 02/16/2017   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   Orthostatic hypotension 08/15/2016   Mixed hyperlipidemia  08/15/2016   Dizziness 12/30/2015   BPH (benign prostatic hyperplasia) 03/03/2015    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   Primary osteoarthritis of left knee 03/03/2015   H/O neoplasm 01/12/2015   History of nonmelanoma skin cancer 01/12/2015   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   Anemia complicating neoplastic disease 07/17/2014   Hepatosplenomegaly 07/17/2014   Neutropenia (HCC) 07/17/2014   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   right forearm, [facility]   Stroke (HCC) 11/2007   R brain subcortical infarct     Past Surgical History:  Procedure Laterality Date   ASPIRATION / INJECTION RENAL CYST  07/08/2015   BACK SURGERY     approx 20- 25 years ago   COLONOSCOPY     EYE SURGERY     cataract both eyes   GAS INSERTION  08/11/2011   Procedure: INSERTION OF GAS;  Surgeon: Sherrie George, MD;  Location: [facility] OR;  Service: Ophthalmology;  Laterality: Right;  [id number]   LEFT HEART CATH AND CORONARY ANGIOGRAPHY N/A 05/02/2023   Procedure: LEFT HEART CATH AND CORONARY ANGIOGRAPHY;  Surgeon: Yvonne Kendall, MD;  Location: [facility] INVASIVE CV LAB;  Service: Cardiovascular;  Laterality: N/A;   REVERSE SHOULDER ARTHROPLASTY Right 04/13/2023   Procedure: REVERSE SHOULDER ARTHROPLASTY;  Surgeon: Christena Flake, MD;  Location: [facility] ORS;  Service: Orthopedics;  Laterality: Right;   SCLERAL BUCKLE   08/11/2011   Procedure: SCLERAL BUCKLE;  Surgeon: Sherrie George, MD;  Location: [facility] OR;  Service: Ophthalmology;  Laterality: Right;   VARICOSE VEIN SURGERY      Social History   Socioeconomic History   Marital status: Married    Spouse name: [name]    Number of children: 2   Years of education: 12+   Highest education level: Some college, no degree  Occupational History   Occupation: Fish farm manager: OTHER    Comment: community   Occupation: SELF EMPLOYED    Employer: SELF EMPLOYED  Tobacco Use   Smoking status: Former    Current packs/day: 0.00    Types: Cigarettes    Quit date: 06/06/2010    Years since quitting: 12.9   Smokeless tobacco: Never  Vaping Use   Vaping status: Never Used  Substance and Sexual Activity   Alcohol use: No    Alcohol/week: 0.0 standard drinks of alcohol   Drug use: No   Sexual activity: Not Currently  Other Topics Concern   Not on file  Social History Narrative   Pt lives at home with his family.   Caffeine Use- 2 cups daily   Patient has 2 children.    Patient has some college.    Patient is right handed.  Works full time   Chemical engineer Strain: Low Risk  (07/12/2021)   Overall Financial Resource Strain (CARDIA)    Difficulty of Paying Living Expenses: Not hard at all  Food Insecurity: No Food Insecurity (05/02/2023)   Hunger Vital Sign    Worried About Running Out of Food in the Last Year: Never true    Ran Out of Food in the Last Year: Never true  Transportation Needs: No Transportation Needs (05/02/2023)   PRAPARE - Administrator, Civil Service (Medical): No    Lack of Transportation (Non-Medical): No  Physical Activity: Inactive (07/12/2021)   Exercise Vital Sign    Days of Exercise per Week: 0 days    Minutes of Exercise per Session: 0 min  Stress: No Stress Concern Present (07/12/2021)   [employer] of Occupational Health - Occupational Stress Questionnaire     Feeling of Stress : Not at all  Social Connections: Socially Integrated (07/12/2021)   Social Connection and Isolation Panel [NHANES]    Frequency of Communication with Friends and Family: Three times a week    Frequency of Social Gatherings with Friends and Family: More than three times a week    Attends Religious Services: More than 4 times per year    Active Member of [employer] or Organizations: Yes    Attends Engineer, structural: More than 4 times per year    Marital Status: Married  Catering manager Violence: Not At Risk (05/02/2023)   Humiliation, Afraid, Rape, and Kick questionnaire    Fear of Current or Ex-Partner: No    Emotionally Abused: No    Physically Abused: No    Sexually Abused: No     Family History  Problem Relation Age of Onset   Heart disease Father    Diabetes Son    Kidney disease Neg Hx    Prostate cancer Neg Hx      Current Facility-Administered Medications:    0.9 %  sodium chloride infusion (Manually program via Guardrails IV Fluids), , Intravenous, Once, Copher, Carole N, DO   0.9 %  sodium chloride infusion, 250 mL, Intravenous, PRN, End, Cristal Deer, MD   acetaminophen (TYLENOL) tablet 650 mg, 650 mg, Oral, Q4H PRN, End, Christopher, MD   ceFEPIme (MAXIPIME) 2 g in sodium chloride 0.9 % 100 mL IVPB, 2 g, Intravenous, Q24H, Hunt, Madison H, RPH, Stopped at 05/02/23 2250   Chlorhexidine Gluconate Cloth 2 % PADS 6 each, 6 each, Topical, Daily, Assaker, Jean-Pierre, MD   docusate sodium (COLACE) capsule 100 mg, 100 mg, Oral, BID PRN, Rust-Chester, Britton L, NP, 100 mg at 05/03/23 1029   heparin injection 5,000 Units, 5,000 Units, Subcutaneous, Q8H, Niswander, Carole N, DO, 5,000 Units at 05/03/23 0507   HYDROmorphone (DILAUDID) injection 0.5 mg, 0.5 mg, Intravenous, Q4H PRN, Bal, Carole N, DO   levothyroxine (SYNTHROID) tablet 25 mcg, 25 mcg, Oral, Q0600, End, Christopher, MD, 25 mcg at 05/03/23 0508   lidocaine (LIDODERM) 5 % 1 patch, 1 patch,  Transdermal, Q24H, Assaker, Jean-Pierre, MD   melatonin tablet 5 mg, 5 mg, Oral, QHS PRN, Margo Aye, Carole N, DO   metroNIDAZOLE (FLAGYL) IVPB 500 mg, 500 mg, Intravenous, Q12H, Rust-Chester, Britton L, NP, Last Rate: 100 mL/hr at 05/03/23 1047, 500 mg at 05/03/23 1047   midodrine (PROAMATINE) tablet 10 mg, 10 mg, Oral, TID WC, Rinella, Carole N, DO, 10 mg at 05/03/23 0835   ondansetron (ZOFRAN) injection 4 mg, 4 mg, Intravenous, Q6H PRN,  End, Cristal Deer, MD   Oral care mouth rinse, 15 mL, Mouth Rinse, PRN, Rust-Chester, Micheline Rough L, NP   oxyCODONE (Oxy IR/ROXICODONE) immediate release tablet 5 mg, 5 mg, Oral, Q4H PRN, Canto, Carole N, DO   pantoprazole (PROTONIX) EC tablet 40 mg, 40 mg, Oral, Daily, End, Christopher, MD, 40 mg at 05/03/23 1029   phenylephrine (NEO-SYNEPHRINE) 20mg /NS premix infusion, 20-200 mcg/min, Intravenous, Titrated, Rust-Chester, Britton L, NP, Last Rate: 82.5 mL/hr at 05/03/23 0840, 110 mcg/min at 05/03/23 0840   polyethylene glycol (MIRALAX / GLYCOLAX) packet 17 g, 17 g, Oral, Daily PRN, Hieu, Rieken N, DO, 17 g at 05/03/23 1029   predniSONE (DELTASONE) tablet 20 mg, 20 mg, Oral, Q breakfast, End, Christopher, MD, 20 mg at 05/02/23 2003   simvastatin (ZOCOR) tablet 40 mg, 40 mg, Oral, Daily, End, Christopher, MD, 40 mg at 05/03/23 1029   sodium bicarbonate 150 mEq in sterile water 1,150 mL infusion, , Intravenous, Continuous, Rust-Chester, Britton L, NP, Last Rate: 75 mL/hr at 05/03/23 0339, Infusion Verify at 05/03/23 0339   sodium chloride flush (NS) 0.9 % injection 3 mL, 3 mL, Intravenous, Q12H, End, Christopher, MD, 3 mL at 05/03/23 1051   sodium chloride flush (NS) 0.9 % injection 3 mL, 3 mL, Intravenous, PRN, End, Cristal Deer, MD   sodium zirconium cyclosilicate (LOKELMA) packet 10 g, 10 g, Oral, TID, Territo, Carole N, DO, 10 g at 05/03/23 0834   vancomycin variable dose per unstable renal function (pharmacist dosing), , Does not apply, See admin instructions, Hunt, Madison  H, RPH  Facility-Administered Medications Ordered in Other Encounters:    epoetin alfa-epbx (RETACRIT) injection 10,000 Units, 10,000 Units, Subcutaneous, Once, Creig Hines, MD   epoetin alfa-epbx (RETACRIT) injection 40,000 Units, 40,000 Units, Subcutaneous, Once, Creig Hines, MD   Physical exam:  Vitals:   05/03/23 1015 05/03/23 1030 05/03/23 1045 05/03/23 1200  BP: 100/67 (!) 109/58 99/67   Pulse: 79 95 87   Resp: 14 15 14    Temp:    98.2 F (36.8 C)  TempSrc:    Axillary  SpO2: 95% 95% 95%   Weight:      Height:       Physical Exam Constitutional:      Comments: Appears fatigued  Cardiovascular:     Rate and Rhythm: Normal rate. Rhythm irregular.     Heart sounds: Normal heart sounds.  Pulmonary:     Effort: Pulmonary effort is normal.     Breath sounds: Normal breath sounds.  Abdominal:     General: There is no distension.     Palpations: Abdomen is soft.  Skin:    General: Skin is warm and dry.  Neurological:     Mental Status: He is alert and oriented to person, place, and time.           Latest Ref Rng & Units 05/03/2023   11:15 AM  CMP  Glucose 70 - 99 mg/dL 093   BUN 8 - 23 mg/dL 74   Creatinine 2.35 - 1.24 mg/dL 5.73   Sodium 220 - 254 mmol/L 133   Potassium 3.5 - 5.1 mmol/L 5.2   Chloride 98 - 111 mmol/L 103   CO2 22 - 32 mmol/L 18   Calcium 8.9 - 10.3 mg/dL 6.9       Latest Ref Rng & Units 05/03/2023    5:32 AM  CBC  WBC 4.0 - 10.5 K/uL 16.1   Hemoglobin 13.0 - 17.0 g/dL 7.8   Hematocrit 27.0 -  52.0 % 24.9   Platelets 150 - 400 K/uL 342     @IMAGES @  US Venous Img Lower Bilateral (DVT)  Result Date: 05/03/2023 EXAM: BILATERAL LOWER EXTREMITY VENOUS DOPPLER ULTRASOUND TECHNIQUE: Gray-scale sonography with graded compression, as well as color Doppler and duplex ultrasound were performed to evaluate the lower extremity deep venous systems from the level of the common femoral vein and including the common femoral, femoral, profunda  femoral, popliteal and calf veins including the posterior tibial, peroneal and gastrocnemius veins when visible. The superficial great saphenous vein was also interrogated. Spectral Doppler was utilized to evaluate flow at rest and with distal augmentation maneuvers in the common femoral, femoral and popliteal veins. COMPARISON:  None Available. FINDINGS: RIGHT LOWER EXTREMITY Common Femoral Vein: No evidence of thrombus. Normal compressibility, respiratory phasicity and response to augmentation. Saphenofemoral Junction: No evidence of thrombus. Normal compressibility and flow on color Doppler imaging. Profunda Femoral Vein: No evidence of thrombus. Normal compressibility and flow on color Doppler imaging. Femoral Vein: No evidence of thrombus. Normal compressibility, respiratory phasicity and response to augmentation. Popliteal Vein: No evidence of thrombus. Normal compressibility, respiratory phasicity and response to augmentation. Calf Veins: No evidence of thrombus. Normal compressibility and flow on color Doppler imaging. Superficial Great Saphenous Vein: No evidence of thrombus. Normal compressibility. Venous Reflux:  None. Other Findings:  None. LEFT LOWER EXTREMITY Common Femoral Vein: No evidence of thrombus. Normal compressibility, respiratory phasicity and response to augmentation. Saphenofemoral Junction: No evidence of thrombus. Normal compressibility and flow on color Doppler imaging. Profunda Femoral Vein: No evidence of thrombus. Normal compressibility and flow on color Doppler imaging. Femoral Vein: No evidence of thrombus. Normal compressibility, respiratory phasicity and response to augmentation. Popliteal Vein: No evidence of thrombus. Normal compressibility, respiratory phasicity and response to augmentation. Calf Veins: No evidence of thrombus. Normal compressibility and flow on color Doppler imaging. Superficial Great Saphenous Vein: No evidence of thrombus. Normal compressibility. Venous  Reflux:  None. Other Findings:  None. IMPRESSION: No evidence of deep venous thrombosis in either lower extremity. Electronically Signed   By: Malachy Moan M.D.   On: 05/03/2023 06:27   DG Chest Port 1 View  Result Date: 05/03/2023 CLINICAL DATA:  Hypoxia EXAM: PORTABLE CHEST 1 VIEW COMPARISON:  Chest x-ray 07/08/2015 FINDINGS: Heart is borderline enlarged. There is no focal lung infiltrate, pleural effusion or pneumothorax. Right shoulder arthroplasty is present. No acute fractures are seen. IMPRESSION: Borderline cardiomegaly. No active disease. Electronically Signed   By: Darliss Cheney M.D.   On: 05/03/2023 00:54   CT ABDOMEN PELVIS WO CONTRAST  Result Date: 05/02/2023 CLINICAL DATA:  Acute nonlocalized abdominal pain. Myelofibrosis, chronic renal insufficiency EXAM: CT ABDOMEN AND PELVIS WITHOUT CONTRAST TECHNIQUE: Multidetector CT imaging of the abdomen and pelvis was performed following the standard protocol without IV contrast. RADIATION DOSE REDUCTION: This exam was performed according to the departmental dose-optimization program which includes automated exposure control, adjustment of the mA and/or kV according to patient size and/or use of iterative reconstruction technique. COMPARISON:  11/14/2016 FINDINGS: Lower chest: Small right and trace left pleural effusions are present associated bibasilar atelectasis. Small pericardial effusion. Global cardiac size is mildly enlarged. Hypoattenuation of the cardiac blood pool is in keeping with at least mild anemia. Hepatobiliary: Mild periportal edema. No intra or extrahepatic biliary ductal dilation. Liver otherwise unremarkable on this noncontrast examination. Gallbladder unremarkable. Pancreas: Unremarkable. No pancreatic ductal dilatation or surrounding inflammatory changes. Spleen: Moderate splenomegaly noted with the spleen measuring a 2.6 cm in greatest dimension.  No definite intrasplenic lesion seen on this noncontrast examination.  Adrenals/Urinary Tract: The adrenal glands are unremarkable. There has, however, developed infiltration within the retroperitoneum surrounding the adrenal glands reflecting retroperitoneal edema as well as shotty retroperitoneal adenopathy within the periportal, left periaortic, and aortocaval lymph node groups, possibly reactive in nature or indicative of a low-grade lymphoproliferative process. Numerous simple and minimally complex renal cysts are seen bilaterally for which no follow-up imaging is recommended. No urinary renal or ureteral calculi. No hydronephrosis. The bladder is unremarkable save for a small amount dependently layering excreted contrast. Stomach/Bowel: Pancolonic diverticulosis, severe within the sigmoid colon, without superimposed acute inflammatory change. Stomach, small-bowel, and large bowel are otherwise unremarkable. Appendix normal. No free intraperitoneal gas or fluid. Vascular/Lymphatic: Extensive aortoiliac atherosclerotic calcification. No aortic aneurysm. Near occlusive atherosclerotic calcification left common femoral artery. Aside from a previously mentioned shotty adenopathy within the upper retroperitoneum, no pathologic adenopathy within the abdomen and pelvis. Reproductive: Prostate is unremarkable. Other: Interval development of diffuse subcutaneous body wall edema. No abdominal wall hernia Musculoskeletal: The osseous structures are diffusely mildly sclerotic in keeping with the given history of myelofibrosis. No acute bone abnormality. No focal lytic or blastic bone lesion. Osseous structures are otherwise age-appropriate. IMPRESSION: 1. Interval development of small right and trace left pleural effusions, small pericardial effusion, retroperitoneal edema and diffuse subcutaneous body wall edema, in keeping with anasarca. 2. Stable moderate splenomegaly. Diffuse osseous sclerosis. Findings are in keeping with changes of myelofibrosis. 3. Interval development of shotty  retroperitoneal adenopathy within the periportal, left periaortic, and aortocaval lymph node groups, possibly reactive in nature or indicative of a low-grade lymphoproliferative process. 4. Pancolonic diverticulosis, severe within the sigmoid colon, without superimposed acute inflammatory change. Aortic Atherosclerosis (ICD10-I70.0). Electronically Signed   By: Helyn Numbers M.D.   On: 05/02/2023 22:51   CARDIAC CATHETERIZATION  Result Date: 05/02/2023 Conclusions: Mild, non-obstructive coronary artery disease. Mildly elevated left ventricular filling pressure. Bedside ultrasound with mildly reduced LVEF with global hypokinesis and small pericardial effusion.  Query pericarditis/myocarditis. Recommendations: Admit to stepdown. Start amiodarone bolus/infusion for atrial fibrillation with rapid ventricular response. PRBC transfusion for target hemoglobin greater than 8. If hemoglobin stable and no enlargement of pericardial effusion, will need to consider heparin infusion in the setting of a-fib with RVR. Yvonne Kendall, MD [facility]  US Venous Img Upper Right (DVT Study)  Result Date: 04/17/2023 CLINICAL DATA:  Hand swelling and pain. EXAM: RIGHT UPPER EXTREMITY VENOUS DOPPLER ULTRASOUND TECHNIQUE: Gray-scale sonography with graded compression, as well as color Doppler and duplex ultrasound were performed to evaluate the upper extremity deep venous system from the level of the subclavian vein and including the jugular, axillary, basilic, radial, ulnar and upper cephalic vein. Spectral Doppler was utilized to evaluate flow at rest and with distal augmentation maneuvers. COMPARISON:  None Available. FINDINGS: Contralateral Subclavian Vein: Respiratory phasicity is normal and symmetric with the symptomatic side. No evidence of thrombus. Normal compressibility. Internal Jugular Vein: No evidence of thrombus. Normal compressibility, respiratory phasicity and response to augmentation. Subclavian Vein: No  evidence of thrombus. Normal compressibility, respiratory phasicity and response to augmentation. Axillary Vein: Unable to evaluate, as per the ultrasound technologist, secondary to recent surgery, limited patient positioning and patient altered mental status. Cephalic Vein: Unable to evaluate, as per the ultrasound technologist, secondary to recent surgery, limited patient positioning and patient altered mental status. Basilic Vein: Unable to evaluate, as per the ultrasound technologist, secondary to recent surgery, limited patient positioning and patient altered mental status. Brachial  Veins: Unable to evaluate, as per the ultrasound technologist, secondary to recent surgery, limited patient positioning and patient altered mental status. Radial Veins: Unable to evaluate, as per the ultrasound technologist, secondary to recent surgery, limited patient positioning and patient altered mental status. Ulnar Veins: Unable to evaluate, as per the ultrasound technologist, secondary to recent surgery, limited patient positioning and patient altered mental status. Venous Reflux:  None visualized. Other Findings:  None visualized. IMPRESSION: Markedly limited study, as described above, without evidence of DVT within the RIGHT internal jugular vein and RIGHT subclavian vein. Electronically Signed   By: Aram Candela M.D.   On: 04/17/2023 21:42   CT Head Wo Contrast  Result Date: 04/17/2023 CLINICAL DATA:  Mental status change, unknown cause Patient reports hearing a "pop" when sitting down in chair yesterday. Also had a reverse right shoulder replacement on 11/7- arm in sling with cooling system. EXAM: CT HEAD WITHOUT CONTRAST TECHNIQUE: Contiguous axial images were obtained from the base of the skull through the vertex without intravenous contrast. RADIATION DOSE REDUCTION: This exam was performed according to the departmental dose-optimization program which includes automated exposure control, adjustment of the mA  and/or kV according to patient size and/or use of iterative reconstruction technique. COMPARISON:  MRI head 04/12/2022 FINDINGS: Brain: Patchy and confluent areas of decreased attenuation are noted throughout the deep and periventricular white matter of the cerebral hemispheres bilaterally, compatible with chronic microvascular ischemic disease. No evidence of large-territorial acute infarction. No parenchymal hemorrhage. No mass lesion. No extra-axial collection. No mass effect or midline shift. No hydrocephalus. Basilar cisterns are patent. Vascular: No hyperdense vessel. Atherosclerotic calcifications are present within the cavernous internal carotid arteries. Skull: No acute fracture or focal lesion. Sinuses/Orbits: Bilateral maxillary sinus mucosal thickening. Otherwise paranasal sinuses and mastoid air cells are clear. Right scleral buckle. Bilateral lens replacement. Otherwise the orbits are unremarkable. Other: None. IMPRESSION: No acute intracranial abnormality. Electronically Signed   By: Tish Frederickson M.D.   On: 04/17/2023 21:40   DG Knee Complete 4 Views Left  Result Date: 04/17/2023 CLINICAL DATA:  Knee pain after injury.  Heard a pop. EXAM: LEFT KNEE - COMPLETE 4+ VIEW COMPARISON:  None Available. FINDINGS: No evidence of fracture, dislocation, or significant joint effusion. Moderate osteoarthritis with tricompartmental peripheral spurring. Lateral tibiofemoral joint space narrowing. 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 Peripheral vascular calcifications. IMPRESSION: 1. No fracture or subluxation of the left knee. 2. Moderate osteoarthritis. 3. Chondrocalcinosis. Electronically Signed   By: Narda Rutherford M.D.   On: 04/17/2023 17:45   DG Shoulder Right Port  Result Date: 04/13/2023 CLINICAL DATA:  Postop arthroplasty EXAM: RIGHT SHOULDER - 2 VIEW COMPARISON:  Preop x-ray 02/28/2016.  CT 03/03/2019 FINDINGS: Skin staples identified. Soft tissue gas seen. Changes of reverse arthroplasty. Screw  component along the glenoid and Press-Fit humeral component. Slight degenerative changes of the [facility] joint. Expected alignment. Imaging was obtained to aid in treatment. IMPRESSION: Acute surgical changes of right shoulder reverse arthroplasty. Electronically Signed   By: Karen Kays M.D.   On: 04/13/2023 17:48   [place] OR NERVE BLOCK-IMAGE ONLY [facility])  Result Date: 04/13/2023 There is no interpretation for this exam.  This order is for images obtained during a surgical procedure.  Please See "Surgeries" Tab for more information regarding the procedure.    Assessment and plan- Patient is a 82 y.o. male with history of myelofibrosis admitted for acute pericarditis, AKI on CKD and possible sepsis.  Hematology consulted for anemia.  Normocytic anemia: Patient has baseline myelofibrosis  and his hemoglobin runs between 9-10.It had drifted down to the eights in August 2024 but again improved to 10.6 in September 2024.  Since early November 2024 his hemoglobin has been again between 8.5-8.9.  During this present hospitalization it has further worsened to 7.8 which can beDue to underlying acute process.  LDH has remained stable over the last 2 years and mildly elevated in the setting of myelofibrosis.  I would like his acute issues to resolve and I will follow-up with him closely as an outpatient.  If hemoglobin continues to remain in the eights I will consider getting a repeat bone marrow biopsy at that time before switching treatments.  Okay to transfuse to keep hemoglobin greater than 8 given his recent episode of chest pain and concern for uremic pericarditis.   Patient noted to have stable moderate splenomegaly which is not new for him and likely secondary to myelofibrosis.  He has small shotty retroperitoneal adenopathy which is likely reactive but I will plan to follow-up on these findings by getting a CT abdomen in about 2 months from now  Thank you for this kind referral and the opportunity to participate  in the care of this patient   Visit Diagnosis 1. ST elevation myocardial infarction (STEMI), unspecified artery (HCC)   2. Hypotension, unspecified hypotension type   3. Tachycardia   4. Anemia, unspecified type     Dr. Owens Shark, MD, MPH [facility] [phone] 05/03/2023

## 2023-05-03 NOTE — Progress Notes (Signed)
PHARMACY CONSULT NOTE - FOLLOW UP  Pharmacy Consult for Electrolyte Monitoring and Replacement   Recent Labs: Potassium (mmol/L)  Date Value  05/03/2023 5.5 (H)  06/18/2014 4.5   Magnesium (mg/dL)  Date Value  62/95/2841 2.0   Calcium (mg/dL)  Date Value  32/44/0102 6.9 (L)   Calcium, Total (mg/dL)  Date Value  72/53/6644 8.5   Albumin (g/dL)  Date Value  03/47/4259 2.4 (L)  09/13/2022 3.9  06/18/2014 3.9   Phosphorus (mg/dL)  Date Value  56/38/7564 7.6 (H)   Sodium (mmol/L)  Date Value  05/03/2023 133 (L)  11/01/2022 141  06/18/2014 141     Assessment:  82 y.o. male with medical history significant of chronic HFpEF, hypertension, hyperlipidemia, myelofibrosis, CKD stage IIIb, anemia of chronic disease, who presented initially to [facility] ED as a STEMI alert.   MIVF sodium bicarbonate 150 mEq in sterile water  at 75 mL/hr  Goal of Therapy:  Potassium 4.0 - 5.1 mmol/L Magnesium 2.0 - 2.4 mg/dL All Other Electrolytes WNL  Plan:  ---sodium zirconium cyclosilicate 10 grams x 3 per NP ---recheck electrolytes in am   Lowella Bandy ,PharmD Clinical Pharmacist 05/03/2023 8:09 AM

## 2023-05-03 NOTE — Consult Note (Signed)
ORTHOPAEDIC CONSULTATION  REQUESTING PHYSICIAN: Janann Colonel, MD  Chief Complaint:   Right upper extremity swelling status post reverse right TSA.  History of Present Illness: [name] is a 82 y.o. male with multiple medical problems including hypertension, hypothyroidism, hyperlipidemia, gastroesophageal reflux disease, chronic renal insufficiency, and coronary artery disease who is now 3 weeks status post a reverse right total shoulder arthroplasty.  The patient apparently was brought to the emergency room for "profound weakness" and concern for a STEMI.  Subsequent cardiac catheterization showed no significant end-arterial disease, so no catheterizations were placed.  The patient subsequent was admitted for treatment of pericarditis.  Orthopedic consultation was requested to evaluate the right upper extremity as he continues to show moderate swelling in the forearm and hand.  The swelling has been present since shortly after surgery and has been felt to be due to the dependent position of his arm while in a sling.  The patient denies any pain in the shoulder and has not seen any swelling or erythema around the shoulder itself.  However he notes that he has had some cold spells and hot spells over the past week or so.  He has been afebrile throughout this hospitalization.  He does note some numbness to his thumb but otherwise denies any numbness or paresthesias down his arm to his hand.  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   right forearm, [facility]   Stroke (HCC) 11/2007   R brain subcortical infarct   Past Surgical History:  Procedure Laterality Date   ASPIRATION / INJECTION RENAL CYST  07/08/2015   BACK SURGERY     approx 20- 25 years ago   COLONOSCOPY     EYE SURGERY     cataract both eyes   GAS INSERTION  08/11/2011   Procedure: INSERTION OF GAS;  Surgeon: Sherrie George, MD;  Location: [facility] OR;  Service: Ophthalmology;  Laterality: Right;  [id number]   LEFT HEART CATH AND CORONARY ANGIOGRAPHY N/A 05/02/2023   Procedure: LEFT HEART CATH AND CORONARY ANGIOGRAPHY;  Surgeon: Yvonne Kendall, MD;  Location: [facility] INVASIVE CV LAB;  Service: Cardiovascular;  Laterality: N/A;   REVERSE SHOULDER ARTHROPLASTY Right 04/13/2023   Procedure: REVERSE SHOULDER ARTHROPLASTY;  Surgeon: Christena Flake, MD;  Location: [facility] ORS;  Service: Orthopedics;  Laterality: Right;   SCLERAL BUCKLE  08/11/2011   Procedure: SCLERAL BUCKLE;  Surgeon: Sherrie George, MD;  Location: [facility] OR;  Service: Ophthalmology;  Laterality: Right;   VARICOSE VEIN SURGERY     Social History   Socioeconomic History   Marital status: Married    Spouse name: [name]    Number of children: 2   Years of education: 12+   Highest education level: Some college, no degree  Occupational History   Occupation: Fish farm manager: OTHER    Comment: community   Occupation: SELF EMPLOYED  Employer: SELF EMPLOYED  Tobacco Use   Smoking status: Former    Current packs/day: 0.00    Types: Cigarettes    Quit date: 06/06/2010    Years since quitting: 12.9   Smokeless tobacco: Never  Vaping Use   Vaping status: Never Used  Substance and Sexual Activity   Alcohol use: No    Alcohol/week: 0.0 standard drinks of alcohol   Drug use: No   Sexual activity: Not Currently  Other Topics Concern   Not on file  Social History Narrative   Pt lives at  home with his family.   Caffeine Use- 2 cups daily   Patient has 2 children.    Patient has some college.    Patient is right handed.          Works full time   Chemical engineer Strain: Low Risk  (07/12/2021)   Overall Financial Resource Strain (CARDIA)    Difficulty of Paying Living Expenses: Not hard at all  Food Insecurity: No Food Insecurity (05/02/2023)   Hunger Vital Sign    Worried About Running Out of Food in the Last Year: Never true    Ran Out of Food in the Last Year: Never true  Transportation Needs: No Transportation Needs (05/02/2023)   PRAPARE - Administrator, Civil Service (Medical): No    Lack of Transportation (Non-Medical): No  Physical Activity: Inactive (07/12/2021)   Exercise Vital Sign    Days of Exercise per Week: 0 days    Minutes of Exercise per Session: 0 min  Stress: No Stress Concern Present (07/12/2021)   [employer] of Occupational Health - Occupational Stress Questionnaire    Feeling of Stress : Not at all  Social Connections: Socially Integrated (07/12/2021)   Social Connection and Isolation Panel [NHANES]    Frequency of Communication with Friends and Family: Three times a week    Frequency of Social Gatherings with Friends and Family: More than three times a week    Attends Religious Services: More than 4 times per year    Active Member of Clubs or Organizations: Yes    Attends Engineer, structural: More than 4 times per year    Marital Status: Married   Family History  Problem Relation Age of Onset   Heart disease Father    Diabetes Son    Kidney disease Neg Hx    Prostate cancer Neg Hx    Allergies  Allergen Reactions   Meloxicam Nausea And Vomiting   Prior to Admission medications   Medication Sig Start Date End Date Taking? Authorizing Provider  acetaminophen (TYLENOL) 500 MG tablet Take 1,000 mg by mouth every 6 (six) hours as needed for mild pain (pain score 1-3).     [provider]  amLODipine (NORVASC) 10 MG tablet Take 1 tablet (10 mg total) by mouth every evening. 09/13/22   Johnson, Megan P, DO  azelastine (ASTELIN) 0.1 % nasal spray Place 1 spray into both nostrils 2 (two) times daily. Use in each nostril as directed 03/28/19   Particia Nearing, PA-C  Calcium Citrate-Vitamin D (CITRACAL + D PO) Take 1 tablet by mouth in the morning.    [provider]  Cholecalciferol (D3-1000 PO) Take 1,000 Units by mouth in the morning.    [provider]  clopidogrel (PLAVIX) 75 MG tablet TAKE ONE TABLET BY MOUTH EVERY DAY 02/17/23   Olevia Perches P, DO  dapagliflozin propanediol (FARXIGA) 10  MG TABS tablet Take 10 mg by mouth. 04/05/23 04/04/24  [provider]  DULoxetine (CYMBALTA) 20 MG capsule Take 1 capsule (20 mg total) by mouth at bedtime. TAKE 1 CAPSULE BY MOUTH EVERY DAY 04/13/23   Wayburn Shaler, Excell Seltzer, MD  Fe Fum-Vit C-Vit B12-FA (TRIGELS-F FORTE) CAPS capsule Take 1 capsule by mouth 2 (two) times daily. Patient not taking: Reported on 04/25/2023 04/21/23   Arnetha Courser, MD  gabapentin (NEURONTIN) 300 MG capsule Take 600 mg by mouth 3 (three) times daily.    [provider]  hydrALAZINE (APRESOLINE) 25 MG tablet Take 1 tablet (25 mg total) by mouth every 8 (eight) hours. 04/21/23   Arnetha Courser, MD  ketoconazole (NIZORAL) 2 % cream Apply twice daily to affected area on buttocks until clear 03/29/23   Deirdre Evener, MD  levothyroxine (SYNTHROID) 25 MCG tablet TAKE 1 TABLET EVERY DAY ON EMPTY STOMACHWITH A GLASS OF WATER AT LEAST 30-60 MINBEFORE BREAKFAST 01/04/23   Johnson, Megan P, DO  lidocaine (LIDODERM) 5 % Place 1 patch onto the skin daily. Remove & Discard patch within 12 hours or as directed by MD Patient taking differently: Place 1 patch onto the skin daily as needed. Remove & Discard patch within 12 hours or as directed by MD 03/20/23   Olevia Perches P, DO  meclizine (ANTIVERT) 25 MG tablet TAKE 1 TABLET  BY MOUTH THREE TIMES DAILYAS NEEDED FOR DIZZINESS 02/10/23   Cannady, Corrie Dandy T, NP  Multiple Vitamin (MULITIVITAMIN WITH MINERALS) TABS Take 1 tablet by mouth in the morning.    [provider]  Naproxen Sodium 220 MG CAPS Take 440 mg by mouth daily as needed (pain).    [provider]  Nutritional Supplements (NEPRO) LIQD Take 1 Can by mouth 2 (two) times daily. 04/21/23   Arnetha Courser, MD  omeprazole (PRILOSEC) 40 MG capsule Take 1 capsule (40 mg total) by mouth at bedtime. 04/13/23   Kimoni Pagliarulo, Excell Seltzer, MD  oxyCODONE (ROXICODONE) 5 MG immediate release tablet Take 1-2 tablets (5-10 mg total) by mouth every 4 (four) hours as needed for moderate pain (pain score 4-6) or severe pain (pain score 7-10). 04/13/23   Jaydah Stahle, Excell Seltzer, MD  simvastatin (ZOCOR) 40 MG tablet Take 1 tablet (40 mg total) by mouth daily. 09/13/22   Johnson, Megan P, DO  sodium bicarbonate 650 MG tablet Take 2 tablets (1,300 mg total) by mouth 2 (two) times daily. 04/21/23   Arnetha Courser, MD  tadalafil (CIALIS) 5 MG tablet Take 1 tablet (5 mg total) by mouth daily. Patient taking differently: Take 5 mg by mouth at bedtime. 09/13/22   Johnson, Megan P, DO  tamsulosin (FLOMAX) 0.4 MG CAPS capsule Take 2 capsules (0.8 mg total) by mouth every evening. 04/13/23   Chanetta Moosman, Excell Seltzer, MD  terbinafine (LAMISIL) 250 MG tablet Take 1 tablet (250 mg total) by mouth every evening. 04/13/23   Brentt Fread, Excell Seltzer, MD  zinc gluconate 50 MG tablet Take 50 mg by mouth in the morning.    [provider]   US Venous Img Upper Uni Right(DVT)  Result Date: 05/03/2023 CLINICAL DATA:  Right upper extremity pain and edema. Evaluate for DVT. EXAM: RIGHT UPPER EXTREMITY VENOUS DOPPLER ULTRASOUND TECHNIQUE: Gray-scale sonography with graded compression, as well as color Doppler and duplex ultrasound were performed to evaluate the upper extremity deep venous system from the level of the subclavian vein and including the jugular, axillary, basilic, radial,  ulnar and upper cephalic vein. Spectral Doppler  was utilized to evaluate flow at rest and with distal augmentation maneuvers. COMPARISON:  None Available. FINDINGS: Contralateral Subclavian Vein: Respiratory phasicity is normal and symmetric with the symptomatic side. No evidence of thrombus. Normal compressibility. Internal Jugular Vein: No evidence of thrombus. Normal compressibility, respiratory phasicity and response to augmentation. Subclavian Vein: No evidence of thrombus. Normal compressibility, respiratory phasicity and response to augmentation. Axillary Vein: No evidence of thrombus. Normal compressibility, respiratory phasicity and response to augmentation. Cephalic Vein: No evidence of thrombus. Normal compressibility, respiratory phasicity and response to augmentation. Basilic Vein: No evidence of thrombus. Normal compressibility, respiratory phasicity and response to augmentation. Brachial Veins: No evidence of thrombus. Normal compressibility, respiratory phasicity and response to augmentation. Radial Veins: No evidence of thrombus. Normal compressibility, respiratory phasicity and response to augmentation. Ulnar Veins: No evidence of thrombus. Normal compressibility, respiratory phasicity and response to augmentation. Other Findings: There is a minimal amount of subcutaneous edema at the level of the right upper arm and forearm. IMPRESSION: No evidence of DVT within the right upper extremity. Electronically Signed   By: Simonne Come M.D.   On: 05/03/2023 12:55   US Venous Img Lower Bilateral (DVT)  Result Date: 05/03/2023 EXAM: BILATERAL LOWER EXTREMITY VENOUS DOPPLER ULTRASOUND TECHNIQUE: Gray-scale sonography with graded compression, as well as color Doppler and duplex ultrasound were performed to evaluate the lower extremity deep venous systems from the level of the common femoral vein and including the common femoral, femoral, profunda femoral, popliteal and calf veins including the posterior  tibial, peroneal and gastrocnemius veins when visible. The superficial great saphenous vein was also interrogated. Spectral Doppler was utilized to evaluate flow at rest and with distal augmentation maneuvers in the common femoral, femoral and popliteal veins. COMPARISON:  None Available. FINDINGS: RIGHT LOWER EXTREMITY Common Femoral Vein: No evidence of thrombus. Normal compressibility, respiratory phasicity and response to augmentation. Saphenofemoral Junction: No evidence of thrombus. Normal compressibility and flow on color Doppler imaging. Profunda Femoral Vein: No evidence of thrombus. Normal compressibility and flow on color Doppler imaging. Femoral Vein: No evidence of thrombus. Normal compressibility, respiratory phasicity and response to augmentation. Popliteal Vein: No evidence of thrombus. Normal compressibility, respiratory phasicity and response to augmentation. Calf Veins: No evidence of thrombus. Normal compressibility and flow on color Doppler imaging. Superficial Great Saphenous Vein: No evidence of thrombus. Normal compressibility. Venous Reflux:  None. Other Findings:  None. LEFT LOWER EXTREMITY Common Femoral Vein: No evidence of thrombus. Normal compressibility, respiratory phasicity and response to augmentation. Saphenofemoral Junction: No evidence of thrombus. Normal compressibility and flow on color Doppler imaging. Profunda Femoral Vein: No evidence of thrombus. Normal compressibility and flow on color Doppler imaging. Femoral Vein: No evidence of thrombus. Normal compressibility, respiratory phasicity and response to augmentation. Popliteal Vein: No evidence of thrombus. Normal compressibility, respiratory phasicity and response to augmentation. Calf Veins: No evidence of thrombus. Normal compressibility and flow on color Doppler imaging. Superficial Great Saphenous Vein: No evidence of thrombus. Normal compressibility. Venous Reflux:  None. Other Findings:  None. IMPRESSION: No evidence  of deep venous thrombosis in either lower extremity. Electronically Signed   By: Malachy Moan M.D.   On: 05/03/2023 06:27   DG Chest Port 1 View  Result Date: 05/03/2023 CLINICAL DATA:  Hypoxia EXAM: PORTABLE CHEST 1 VIEW COMPARISON:  Chest x-ray 07/08/2015 FINDINGS: Heart is borderline enlarged. There is no focal lung infiltrate, pleural effusion or pneumothorax. Right shoulder arthroplasty is present. No acute fractures are seen. IMPRESSION: Borderline cardiomegaly. No active disease. Electronically Signed  By: Darliss Cheney M.D.   On: 05/03/2023 00:54   CT ABDOMEN PELVIS WO CONTRAST  Result Date: 05/02/2023 CLINICAL DATA:  Acute nonlocalized abdominal pain. Myelofibrosis, chronic renal insufficiency EXAM: CT ABDOMEN AND PELVIS WITHOUT CONTRAST TECHNIQUE: Multidetector CT imaging of the abdomen and pelvis was performed following the standard protocol without IV contrast. RADIATION DOSE REDUCTION: This exam was performed according to the departmental dose-optimization program which includes automated exposure control, adjustment of the mA and/or kV according to patient size and/or use of iterative reconstruction technique. COMPARISON:  11/14/2016 FINDINGS: Lower chest: Small right and trace left pleural effusions are present associated bibasilar atelectasis. Small pericardial effusion. Global cardiac size is mildly enlarged. Hypoattenuation of the cardiac blood pool is in keeping with at least mild anemia. Hepatobiliary: Mild periportal edema. No intra or extrahepatic biliary ductal dilation. Liver otherwise unremarkable on this noncontrast examination. Gallbladder unremarkable. Pancreas: Unremarkable. No pancreatic ductal dilatation or surrounding inflammatory changes. Spleen: Moderate splenomegaly noted with the spleen measuring a 2.6 cm in greatest dimension. No definite intrasplenic lesion seen on this noncontrast examination. Adrenals/Urinary Tract: The adrenal glands are unremarkable. There  has, however, developed infiltration within the retroperitoneum surrounding the adrenal glands reflecting retroperitoneal edema as well as shotty retroperitoneal adenopathy within the periportal, left periaortic, and aortocaval lymph node groups, possibly reactive in nature or indicative of a low-grade lymphoproliferative process. Numerous simple and minimally complex renal cysts are seen bilaterally for which no follow-up imaging is recommended. No urinary renal or ureteral calculi. No hydronephrosis. The bladder is unremarkable save for a small amount dependently layering excreted contrast. Stomach/Bowel: Pancolonic diverticulosis, severe within the sigmoid colon, without superimposed acute inflammatory change. Stomach, small-bowel, and large bowel are otherwise unremarkable. Appendix normal. No free intraperitoneal gas or fluid. Vascular/Lymphatic: Extensive aortoiliac atherosclerotic calcification. No aortic aneurysm. Near occlusive atherosclerotic calcification left common femoral artery. Aside from a previously mentioned shotty adenopathy within the upper retroperitoneum, no pathologic adenopathy within the abdomen and pelvis. Reproductive: Prostate is unremarkable. Other: Interval development of diffuse subcutaneous body wall edema. No abdominal wall hernia Musculoskeletal: The osseous structures are diffusely mildly sclerotic in keeping with the given history of myelofibrosis. No acute bone abnormality. No focal lytic or blastic bone lesion. Osseous structures are otherwise age-appropriate. IMPRESSION: 1. Interval development of small right and trace left pleural effusions, small pericardial effusion, retroperitoneal edema and diffuse subcutaneous body wall edema, in keeping with anasarca. 2. Stable moderate splenomegaly. Diffuse osseous sclerosis. Findings are in keeping with changes of myelofibrosis. 3. Interval development of shotty retroperitoneal adenopathy within the periportal, left periaortic, and  aortocaval lymph node groups, possibly reactive in nature or indicative of a low-grade lymphoproliferative process. 4. Pancolonic diverticulosis, severe within the sigmoid colon, without superimposed acute inflammatory change. Aortic Atherosclerosis (ICD10-I70.0). Electronically Signed   By: Helyn Numbers M.D.   On: 05/02/2023 22:51   CARDIAC CATHETERIZATION  Result Date: 05/02/2023 Conclusions: Mild, non-obstructive coronary artery disease. Mildly elevated left ventricular filling pressure. Bedside ultrasound with mildly reduced LVEF with global hypokinesis and small pericardial effusion.  Query pericarditis/myocarditis. Recommendations: Admit to stepdown. Start amiodarone bolus/infusion for atrial fibrillation with rapid ventricular response. PRBC transfusion for target hemoglobin greater than 8. If hemoglobin stable and no enlargement of pericardial effusion, will need to consider heparin infusion in the setting of a-fib with RVR. Yvonne Kendall, MD [facility]   Positive ROS: All other systems have been reviewed and were otherwise negative with the exception of those mentioned in the HPI and as above.  Physical  Exam: General:  Alert, no acute distress Psychiatric:  Patient is competent for consent with normal mood and affect   Cardiovascular:  No pedal edema Respiratory:  No wheezing, non-labored breathing GI:  Abdomen is soft and non-tender Skin:  No lesions in the area of chief complaint Neurologic:  Sensation intact distally Lymphatic:  No axillary or cervical lymphadenopathy  Orthopedic Exam:  Orthopedic examination is limited to the right shoulder and upper extremity.  The surgical incision appears to be healing well and is without evidence for infection.  There is perhaps minimal swelling anteriorly, but there is no swelling laterally or posteriorly, nor does there appear to be any shoulder effusion on examination.  No erythema, ecchymosis, abrasions, or other skin abnormalities  are identified.  The right upper extremity is notable for moderate swelling, especially from the elbow down to and including the hand.  He has difficulty making a full fist due to the swelling.  He is grossly neurovascularly intact to all digits, other than some subjectively decreased sensation to the right thumb tip.  X-rays:  A recently completed Doppler ultrasound of the right upper extremity shows no evidence for an upper extremity DVT.  Assessment: Right upper extremity swelling secondary to lymphedema status post reverse right total shoulder arthroplasty.  Plan: The treatment options have been discussed with the patient and her wife, who is at the bedside.  At this point, I do not feel that there is any infection in the shoulder itself.  Regarding the lymphedema in the right forearm and hand, I feel that this would best be managed at this time with continued elevation above heart level.  In addition, the patient is encouraged to work on grasping activities with the right hand in order to optimize function of the forearm musculature to help push the blood back up the arm.  Thank you for asking me to participate in the care of this most delightful yet unfortunate man.  I will be happy to follow him with you.   Maryagnes Amos, MD  Beeper #:  [phone]  05/03/2023 1:43 PM

## 2023-05-04 ENCOUNTER — Other Ambulatory Visit: Payer: Self-pay

## 2023-05-04 ENCOUNTER — Inpatient Hospital Stay: Payer: Medicare Other

## 2023-05-04 DIAGNOSIS — I309 Acute pericarditis, unspecified: Secondary | ICD-10-CM

## 2023-05-04 DIAGNOSIS — R579 Shock, unspecified: Secondary | ICD-10-CM

## 2023-05-04 DIAGNOSIS — N170 Acute kidney failure with tubular necrosis: Secondary | ICD-10-CM

## 2023-05-04 DIAGNOSIS — I4891 Unspecified atrial fibrillation: Secondary | ICD-10-CM | POA: Diagnosis not present

## 2023-05-04 DIAGNOSIS — I213 ST elevation (STEMI) myocardial infarction of unspecified site: Secondary | ICD-10-CM

## 2023-05-04 DIAGNOSIS — N179 Acute kidney failure, unspecified: Secondary | ICD-10-CM | POA: Insufficient documentation

## 2023-05-04 DIAGNOSIS — D649 Anemia, unspecified: Secondary | ICD-10-CM | POA: Diagnosis not present

## 2023-05-04 LAB — MAGNESIUM
Magnesium: 2 mg/dL (ref 1.7–2.4)
Magnesium: 2 mg/dL (ref 1.7–2.4)

## 2023-05-04 LAB — CBC
HCT: 23.7 % — ABNORMAL LOW (ref 39.0–52.0)
Hemoglobin: 7.5 g/dL — ABNORMAL LOW (ref 13.0–17.0)
MCH: 28.7 pg (ref 26.0–34.0)
MCHC: 31.6 g/dL (ref 30.0–36.0)
MCV: 90.8 fL (ref 80.0–100.0)
Platelets: 271 10*3/uL (ref 150–400)
RBC: 2.61 MIL/uL — ABNORMAL LOW (ref 4.22–5.81)
RDW: 19.5 % — ABNORMAL HIGH (ref 11.5–15.5)
WBC: 7.1 10*3/uL (ref 4.0–10.5)
nRBC: 0.6 % — ABNORMAL HIGH (ref 0.0–0.2)

## 2023-05-04 LAB — C3 COMPLEMENT: C3 Complement: 118 mg/dL (ref 82–167)

## 2023-05-04 LAB — T3, FREE: T3, Free: 1.1 pg/mL — ABNORMAL LOW (ref 2.0–4.4)

## 2023-05-04 LAB — BASIC METABOLIC PANEL
Anion gap: 14 (ref 5–15)
Anion gap: 13 (ref 5–15)
BUN: 79 mg/dL — ABNORMAL HIGH (ref 8–23)
BUN: 84 mg/dL — ABNORMAL HIGH (ref 8–23)
CO2: 17 mmol/L — ABNORMAL LOW (ref 22–32)
CO2: 18 mmol/L — ABNORMAL LOW (ref 22–32)
Calcium: 6.7 mg/dL — ABNORMAL LOW (ref 8.9–10.3)
Calcium: 6.7 mg/dL — ABNORMAL LOW (ref 8.9–10.3)
Chloride: 100 mmol/L (ref 98–111)
Chloride: 100 mmol/L (ref 98–111)
Creatinine, Ser: 5.07 mg/dL — ABNORMAL HIGH (ref 0.61–1.24)
Creatinine, Ser: 5.06 mg/dL — ABNORMAL HIGH (ref 0.61–1.24)
GFR, Estimated: 11 mL/min — ABNORMAL LOW (ref >60)
GFR, Estimated: 11 mL/min — ABNORMAL LOW (ref >60)
Glucose, Bld: 123 mg/dL — ABNORMAL HIGH (ref 70–99)
Glucose, Bld: 192 mg/dL — ABNORMAL HIGH (ref 70–99)
Potassium: 4.8 mmol/L (ref 3.5–5.1)
Potassium: 4.6 mmol/L (ref 3.5–5.1)
Sodium: 131 mmol/L — ABNORMAL LOW (ref 135–145)
Sodium: 131 mmol/L — ABNORMAL LOW (ref 135–145)

## 2023-05-04 LAB — GLUCOSE, CAPILLARY
Glucose-Capillary: 101 mg/dL — ABNORMAL HIGH (ref 70–99)
Glucose-Capillary: 112 mg/dL — ABNORMAL HIGH (ref 70–99)
Glucose-Capillary: 164 mg/dL — ABNORMAL HIGH (ref 70–99)
Glucose-Capillary: 176 mg/dL — ABNORMAL HIGH (ref 70–99)
Glucose-Capillary: 182 mg/dL — ABNORMAL HIGH (ref 70–99)

## 2023-05-04 LAB — ANA COMPREHENSIVE PANEL

## 2023-05-04 LAB — SEDIMENTATION RATE: Sed Rate: 109 mm/h — ABNORMAL HIGH (ref 0–20)

## 2023-05-04 LAB — PROCALCITONIN: Procalcitonin: 1.2 ng/mL

## 2023-05-04 LAB — LIPOPROTEIN A (LPA): Lipoprotein (a): 22.8 nmol/L (ref <75.0)

## 2023-05-04 LAB — VANCOMYCIN, RANDOM: Vancomycin Rm: 15 ug/mL

## 2023-05-04 LAB — HEMOGLOBIN AND HEMATOCRIT, BLOOD
HCT: 24.7 % — ABNORMAL LOW (ref 39.0–52.0)
Hemoglobin: 7.7 g/dL — ABNORMAL LOW (ref 13.0–17.0)

## 2023-05-04 LAB — PHOSPHORUS
Phosphorus: 7.7 mg/dL — ABNORMAL HIGH (ref 2.5–4.6)
Phosphorus: 7.5 mg/dL — ABNORMAL HIGH (ref 2.5–4.6)

## 2023-05-04 LAB — C4 COMPLEMENT: Complement C4, Body Fluid: 28 mg/dL (ref 12–38)

## 2023-05-04 LAB — CORTISOL-AM, BLOOD: Cortisol - AM: 16.6 ug/dL (ref 6.7–22.6)

## 2023-05-04 LAB — ANA W/REFLEX IF POSITIVE: Anti Nuclear Antibody (ANA): NEGATIVE

## 2023-05-04 LAB — HAPTOGLOBIN: Haptoglobin: 367 mg/dL — ABNORMAL HIGH (ref 38–329)

## 2023-05-04 MED ORDER — BISACODYL 10 MG RE SUPP
10.0000 mg | Freq: Once | RECTAL | Status: AC
  Administered 2023-05-04: 10 mg via RECTAL
  Filled 2023-05-04: qty 1

## 2023-05-04 MED ORDER — POLYETHYLENE GLYCOL 3350 17 G PO PACK
17.0000 g | PACK | Freq: Every day | ORAL | Status: DC
  Administered 2023-05-04 – 2023-05-05 (×2): 17 g via ORAL
  Filled 2023-05-04 (×6): qty 1

## 2023-05-04 MED ORDER — BISACODYL 10 MG RE SUPP: 10.0000 mg | Freq: Every day | RECTAL | Status: DC | PRN

## 2023-05-04 MED ORDER — SODIUM BICARBONATE 8.4 % IV SOLN
INTRAVENOUS | Status: AC
  Filled 2023-05-04 (×4): qty 1000

## 2023-05-04 NOTE — Progress Notes (Signed)
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  He was found to have ST elevations in the anterolateral leads and inferior leads currently status post left heart cath on 11/26 with nonobstructive coronary artery disease.  Presentation thought to be secondary to pericarditis and was started on prednisone.  Furthermore course complicated by shock requiring neosynephrine and he was noted to have elevated white count and therefore was broadly cultured and started on vancomycin and cefepime.  Finally he was noted to be acute on chronic renal failure with creatinine peaking at 4.9 mg/dL.  Potassium stable at 5.5 mEq.  Acidosis noted with bicarb of 16.  Started on bicarb drip overnight.  CT abdomen pelvis 11/26 with small right and left pleural effusion and small pericardial effusion.  Anasarca was also noted.  Stable moderate splenomegaly.  Retroperitoneal adenopathy.  Pertinent  Medical History  As per HPI.  Significant Hospital Events: Including procedures, antibiotic start and stop dates in addition to other pertinent events   -- s/p LHC 11/27 NOCAD. ICU admit on Neo. Started on bicarb drip.   Interim History / Subjective:  This AM patient feeling generally well has no major complaints. Off pressors.   Objective   Blood pressure (!) 81/65, pulse 93, temperature 98.1 F (36.7 C), temperature source Oral, resp. rate 20, height 5\' 10"  (1.778 m), weight 89.1 kg, SpO2 97%.        Intake/Output Summary (Last 24 hours) at 05/04/2023 0748 Last data filed at 05/04/2023 0600 Gross per 24 hour  Intake 2648.63 ml  Output 500 ml  Net  2148.63 ml   Filed Weights   05/02/23 1520 05/03/23 0315 05/04/23 0500  Weight: 81.6 kg 86.7 kg 89.1 kg    Examination: General: NAD HENT: Supple neck, reactive pupils  Lungs: Clear bilateral air entry  Cardiovascular: Normal S1, Normal S2, irregularly irregular rhythm Abdomen: soft, mildly tender Extremities: Warm and well perfused, RUE swelling.    Labs and imaging were reviewed.   Echocardiogram 05/04/2023 - LVEF 40-45%. LV with global hypokinesis. RV mildly reduced. Mild elevatioin in PASP . Mild MV regurg, TR mild to mod.  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 Found to have ST elevations in the anterolateral and inferior leads. S/p LHC with NOCAD 11/26. Course c/b Acute on chronic anemia s/p 2 PRBC transfusions. Also c/b Acute on chronic renal failure with Cr 4.8 mg/dl now on bicarb dri. Shcok on Neosynephrine  #Shock likely distributive in the setting of proinflammatory state/Myopericarditis - CXR negative - CT a/p non revealing - UA negative -  blood cultures negative. Right shoulder without erythema or tenderness. Procal no role in sevre AKI #NOCAD with ST elevations in the anterolateral and inferior leads likely myopericarditis. Unclear reason, Resp viral panel negative.  #Acute on chronic anemia likely reactive in the setting of proinflammatory state on a background of myleodysplastic  syndrome.  #Acute on chronic renal failure with cr 4.8mg /dl from a baseline of 2.0mg /dl pre-renal ATN. She was started on hydralazine 2 weeks ago which raises the concern for an autoimmune lupus like picture leading to serositis and AKI.  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 RUE [place] negative for  DVT.   Neuro: Tylenol for analgesia.  CVS: MAPS > 65. Repeat ESR and CRP. C/w Prednisone 20mg  Daily. Unable to do colchicine given renal function. C/w Simvastatin 40mg  PO daily.  Lungs: No issues.  ID: d/c Abx.  GI: Cardiac diet. PRN laxatives, no bowel movements in 5 days. PO Pantoprazole.  Renal: Restart bicarb drip for 1L at 125cc/hr. Monitor urine output. Pending ANA. Discuss iHD with Renal for volume optimization.  Endo: POC 140-180. Continue with levothyroxine.   Best Practice (right click and "Reselect all SmartList Selections" daily)   Diet/type: Regular consistency (see orders) DVT prophylaxis: heparin injection 5,000 Units Start: 05/02/23 2200 SCD's Start: 05/02/23 1715   Pressure ulcer(s): not present on admission  GI prophylaxis: PPI Lines: N/A Foley:  Yes, and it is still needed Code Status:  full code  Last date of multidisciplinary goals of care discussion [05/04/2023]  I spent 60 minutes caring for this patient today, including preparing to see the patient, obtaining a medical history , reviewing a separately obtained history, performing a medically appropriate examination and/or evaluation, counseling and educating the patient/family/caregiver, and referring and communicating with other health care professionals (not separately reported).  Janann Colonel, MD [facility] 05/04/2023 7:48 AM

## 2023-05-04 NOTE — Plan of Care (Signed)

## 2023-05-04 NOTE — Progress Notes (Signed)
PHARMACY CONSULT NOTE - FOLLOW UP  Pharmacy Consult for Electrolyte Monitoring and Replacement   Recent Labs: Potassium (mmol/L)  Date Value  05/04/2023 4.8  06/18/2014 4.5   Magnesium (mg/dL)  Date Value  29/52/8413 2.0   Calcium (mg/dL)  Date Value  24/40/1027 6.7 (L)   Calcium, Total (mg/dL)  Date Value  25/36/6440 8.5   Albumin (g/dL)  Date Value  34/74/2595 2.3 (L)  09/13/2022 3.9  06/18/2014 3.9   Phosphorus (mg/dL)  Date Value  63/87/5643 7.7 (H)   Sodium (mmol/L)  Date Value  05/04/2023 131 (L)  11/01/2022 141  06/18/2014 141   Corrected Ca 8.1 mg/dL  Assessment:  82 y.o. male with medical history significant of chronic HFpEF, hypertension, hyperlipidemia, myelofibrosis, CKD stage IIIb, anemia of chronic disease, who presented initially to [facility] ED as a STEMI alert.   MIVF sodium bicarbonate 150 mEq in 5% dextrose  at 75 mL/hr  Goal of Therapy:  Potassium 4.0 - 5.1 mmol/L Magnesium 2.0 - 2.4 mg/dL All Other Electrolytes WNL  Plan:  ---no electrolyte replacement warranted for today ---recheck electrolytes at 1600 and again in am   Lowella Bandy ,PharmD Clinical Pharmacist 05/04/2023 6:40 AM

## 2023-05-04 NOTE — Progress Notes (Addendum)
Progress Note   Patient: [name]:[id number] DOB: 08-04-1940 DOA: 05/02/2023     2 DOS: the patient was seen and examined on 05/04/2023   Brief hospital course: [name] is a 82 y.o. male with medical history significant of chronic HFpEF, hypertension, hyperlipidemia, myelofibrosis, CKD stage IIIb, anemia of chronic disease, who presented initially to [facility] ED with chest pain and shortness of breath.  Troponin 41, EKG showed ST elevation.  Patient was taken to the Cath Lab, did not show any occlusion in coronary arteries.  Condition consistent with acute pericarditis.  Treated with colchicine. Also has significant worsening renal function at the time of admission, associated with hyperkalemia.  Patient was given IV fluids, Lokelma.  Followed by nephrology.   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 Chronic systolic congestive heart failure. Patient has been seen by cardiology, cardiac cath did not show significant coronary artery disease.  Patient has acute pericarditis.  She is treated with a lower dose of prednisone to 20 mg daily. Echocardiogram showed ejection fraction 40 to 45%, mild pulm hypertension.  Did not show any pericardial effusion.   Acute hypoxemic respiratory failure. Patient had a signal short of breath, potentially from pericarditis.  Oxygen saturation dropped down to 87%, patient still on 2 L oxygen.  Will follow closely.  Patient is also followed by nephrology for volume status.  SIRS. Likely hypovolemic shock. Patient had a significant hypotension, resulting in severe acute kidney  injury.  He also has a mild elevation of procalcitonin level, but could not identify any source of infection. He was placed on broad-spectrum antibiotics with vancomycin and cefepime.  Blood culture has no growth.  Procalcitonin level still elevated, I could not rule out a source of pneumonia.  Will repeat a chest x-ray, continue cefepime.  Will discontinue vancomycin, prior MRSA culture was negative.   Acute renal failure on chronic kidney disease stage IIIb secondary to ATN. Hyperkalemia secondary to acute renal failure. Metabolic acidosis secondary to acute renal failure. Hyponatremia. Patient also received IV contrast, but renal function already worsened at the time of admission.  Worsening renal function appears to be secondary to  hypotension.  Patient was on IV pressor, was able to wean off last night. Blood pressure is more stable now.  Patient still has significant metabolic acidosis, potassium level has normalized.  I will continue bicarb drip.  Patient renal function still getting worse, followed by nephrology.   Anemia of chronic kidney disease. Patient recent study with iron and B12 levels are normal.  Continue monitor hemoglobin and transfuse as needed.   Anasarca. Right arm lymphedema. CT scan abdomen/pelvis showed evidence of anasarca, this could be secondary to renal failure. Right arm lymphedema, no evidence of infection.  Seen by orthopedics.         Subjective:  Patient still require oxygen, short of breath with exertion.  Physical Exam: Vitals:   05/04/23 0500 05/04/23 0600 05/04/23 0700 05/04/23 0800  BP: 108/62 102/64 (!) 81/65   Pulse: (!) 101 100 93   Resp: (!) 21 (!) 21 20   Temp:    98  F (36.7 C)  TempSrc:    Oral  SpO2: 95% 95% 97%   Weight: 89.1 kg     Height:       General exam: Appears calm and comfortable  Respiratory system: Clear to auscultation. Respiratory effort normal. Cardiovascular system: S1 & S2 heard, RRR. No JVD, murmurs, rubs,  gallops or clicks.  Anasarca. Gastrointestinal system: Abdomen is nondistended, soft and nontender. No organomegaly or masses felt. Normal bowel sounds heard. Central nervous system: Alert and oriented x3. No focal neurological deficits. Extremities: Right arm swelling better. Skin: No rashes, lesions or ulcers Psychiatry: Judgement and insight appear normal. Mood & affect appropriate.    Data Reviewed:  Lab results reviewed.  Family Communication: None  Disposition: Status is: Inpatient Remains inpatient appropriate because: Severity of disease, IV treatment.  Close monitoring in stepdown unit.     Time spent: 55 minutes  Author: Marrion Coy, MD 05/04/2023 10:36 AM  For on call review [email or web address].

## 2023-05-04 NOTE — Progress Notes (Signed)
Patient Name: [name] Date of Encounter: 05/04/2023 [facility] Cardiologist: Mariah Milling  Interval Summary  .    Feeling well today without chest pain or shortness of breath.  Vital Signs .    Vitals:   05/04/23 0930 05/04/23 1000 05/04/23 1030 05/04/23 1100  BP: 110/72 (!) 88/70 106/62 104/62  Pulse: 97 (!) 107 90 (!) 105  Resp: (!) 22 (!) 21 19 19   Temp:      TempSrc:      SpO2: 96% 95% 93% 95%  Weight:      Height:        Intake/Output Summary (Last 24 hours) at 05/04/2023 1124 Last data filed at 05/04/2023 0600 Gross per 24 hour  Intake 2548.63 ml  Output 400 ml  Net 2148.63 ml      05/04/2023    5:00 AM 05/03/2023    3:15 AM 05/02/2023    3:20 PM  Last 3 Weights  Weight (lbs) 196 lb 6.9 oz 191 lb 2.2 oz 180 lb  Weight (kg) 89.1 kg 86.7 kg 81.647 kg      Telemetry/ECG    Atrial fibrillation with heart rates predominantly 90-110 bpm but spiking up to 136 bpm - Personally Reviewed  Physical Exam .   GEN: No acute distress.  Family is at the bedside. Neck: No JVD Cardiac: Irregularly irregular without murmurs, rubs, or gallops. Respiratory: Mildly diminished breath sounds throughout without wheezes or crackles. GI: Soft, nontender, non-distended  MS: No right arm swelling.  No lower extremity edema.  Assessment & Plan .     Acute pericarditis: Chest and abdominal pain have resolved with addition of prednisone.  EKG this morning shows persistent widespread ST elevations, though less pronounced than on prior tracings 2 days ago.  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  ESR and CRP noted to be markedly elevated on admission.  Etiology for pericarditis remains uncertain.  Considerations include idiopathic, drug-induced with recent initiation of hydralazine, or uremic, though nephrology thinks relatively sudden onset of AKI would be less likely to cause acute pericarditis.  ANA negative. -Continue prednisone 20 mg  daily. -Colchicine 0.3 mg given x 1 on 11/26.  Would not redose at this time in the setting of acute kidney injury superimposed on chronic kidney disease. -Unable to use NSAIDs with renal dysfunction.  Acute on chronic anemia with myelofibrosis: Hemoglobin relatively stable but still quite low. -Consider PRBC transfusion for target hemoglobin greater than 8. -Will recheck H&H at noon to help determine if feasible to initiate anticoagulation (see details below).  Atrial fibrillation with rapid ventricular response: New diagnosis for the patient.  May have been precipitated by acute pericarditis.  Patient is high risk for cardioembolic events in the setting of his acute inflammatory process and other comorbidities with a CHA2DS2-VASc score of at least 6.  He is also at high risk for bleeding complications.  I have spoken with [name] about this and we have agreed to recheck a CBC around noon initiate heparin infusion if his hemoglobin is stable. -Initiate heparin infusion if hemoglobin stable on recheck. -Ventricular rate control reasonable off AV nodal blocking agents.  Hypotension at times limits rate controlling options.  If RVR develops, would recommend resumption of amiodarone infusion for short-term rate control.  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  EF may be somewhat underestimated in the setting of atrial fibrillation.  [name] appears fairly euvolemic. -Maintain net even fluid balance. -  Consider PRBC transfusion, as above.  Gentle diuresis may be necessary with PRBC transfusion to minimize risk for transfusion associated circulatory overload (TACO). -Unable to add GDMT at this time given acute kidney injury superimposed on chronic kidney disease and soft blood pressure.  Acute kidney injury superimposed on chronic kidney disease: Creatinine gradually trending up to 5.1 today (baseline has been 2-2.5, noted to be 4.5 on  admission).  An element of CIN could be contributing to further rise in creatinine, though only 25 mL of contrast were used for diagnostic catheterization.  Patient remains somewhat oliguric, having made 500 mL of urine yesterday. -Continue supportive care.  Appreciate nephrology recommendations. -Target net even fluid balance.  As above, patient may benefit from PRBC transfusion.  If he develops further signs of volume overload, judicious diuresis will need to be considered to minimize risk for TACO.  For questions or updates, please contact [facility] Please consult [email or web address] for contact info under [facility].    Signed, Yvonne Kendall, MD

## 2023-05-04 NOTE — Plan of Care (Signed)
  Problem: Clinical Measurements: Goal: Diagnostic test results will improve Outcome: Progressing Goal: Respiratory complications will improve Outcome: Progressing Goal: Cardiovascular complication will be avoided Outcome: Progressing   Problem: Elimination: Goal: Will not experience complications related to bowel motility Outcome: Progressing Goal: Will not experience complications related to urinary retention Outcome: Progressing   Problem: Pain Management: Goal: General experience of comfort will improve Outcome: Progressing   Problem: Skin Integrity: Goal: Risk for impaired skin integrity will decrease Outcome: Progressing

## 2023-05-04 NOTE — Progress Notes (Signed)
[facility] [place], [place] 05/04/23  Subjective:   Hospital day # 2   Patient known to our practice from outpatient follow-up of CKD.  Last seen on October 30.  He was started on Farxiga at that time.  Previous autoimmune workup in June 2024 negative ANA, anti-GBM, PLA2R, ANCA.  Creatinine as outpatient was 2.58/GFR 24 from April 05, 2023.  Patient underwent reverse right total shoulder arthroplasty on April 13, 2023. He presented to the emergency room on November 11 for right knee pain and possible hallucinations.  He was admitted from 04/17/2023 and discharged on 04/21/2023 Creatinine at the time of discharge was 2.52/GFR 25 which is at his baseline. Potassium was mildly elevated at 5.5.  Hemoglobin was 8.3.  Losartan was stopped and he was started on hydralazine 25 mg 3 times per day.  He was also prescribed gabapentin 600 mg 3 times per day.  He presented to the emergency room for complaints of hard to arouse, generalized pain.  Triage notes indicate generalized weakness.  Blood pressure was 90/86 upon arrival with heart rate of 134.  He was noted to have elevated ST segment on EKG and thought to have STEMI.  He underwent emergent cardiac catheterization which showed mild nonobstructive coronary disease, mildly reduced LVEF with global hypokinesis and small pericardial effusion.  A question was raised about pericarditis versus myocarditis.  He was started on amiodarone for atrial fibrillation with RVR.  He was also noted to have elevated creatinine of 4.52 at admission which has increased further to 4.91 this morning. Urine output has decreased significantly.  Patient has a Foley catheter.  Patient seen earlier today.  Son at bedside.  No significant change in clinical status since yesterday.  Creatinine and BUN are about the same.  Hemoglobin slightly lower at 7.5 Urine output of about 500 cc last 24 hours.  He has a Foley in place.  Requiring 2 L oxygen by nasal  cannula.  Denies any nausea vomiting or acute shortness of breath.    Objective:  Vital signs in last 24 hours:  Temp:  [97.6 F (36.4 C)-99.6 F (37.6 C)] 98 F (36.7 C) (11/28 0800) Pulse Rate:  [38-109] 105 (11/28 1100) Resp:  [10-24] 19 (11/28 1100) BP: (81-119)/(53-85) 104/62 (11/28 1100) SpO2:  [93 %-98 %] 95 % (11/28 1100) Weight:  [89.1 kg] 89.1 kg (11/28 0500)  Weight change: 7.453 kg Filed Weights   05/02/23 1520 05/03/23 0315 05/04/23 0500  Weight: 81.6 kg 86.7 kg 89.1 kg    Intake/Output:    Intake/Output Summary (Last 24 hours) at 05/04/2023 1147 Last data filed at 05/04/2023 0600 Gross per 24 hour  Intake 2548.63 ml  Output 400 ml  Net 2148.63 ml     Physical Exam: General: Ill-appearing, laying in the bed  HEENT Moist oral mucous membranes  Pulm/lungs [place] O2, coarse breath sounds at bases, no crackles  CVS/Heart Irregular rhythm, tachycardic  Abdomen:  Soft, tympanic, distended  Extremities: Trace peripheral edema  Neurologic: Alert, able to answer simple questions  Skin: Warm, dry  Access:    Foley in place    Basic Metabolic Panel:  Recent Labs  Lab 05/02/23 2205 05/03/23 0532 05/03/23 1115 05/03/23 2309 05/04/23 0333  NA 133* 133* 133* 133* 131*  K 5.5* 5.5* 5.2* 4.9 4.8  CL 105 105 103 100 100  CO2 16* 19* 18* 19* 17*  GLUCOSE 124* 152* 124* 134* 123*  BUN 68* 70* 74* 78* 79*  CREATININE 4.78* 4.91* 4.88*  5.01* 5.07*  CALCIUM 7.2* 6.9* 6.9* 6.7* 6.7*  MG 1.9 2.0  --   --  2.0  PHOS 7.1* 7.6* 8.1*  --  7.7*     CBC: Recent Labs  Lab 05/02/23 1516 05/02/23 2205 05/03/23 0256 05/03/23 0532 05/03/23 1326 05/03/23 2309 05/04/23 0333  WBC 16.3* 16.9*  --  16.1*  --   --  7.1  NEUTROABS 12.0*  --   --   --   --   --   --   HGB 7.0* 7.3* 7.7* 7.8* 8.1* 7.7* 7.5*  HCT 22.8* 24.0* 24.3* 24.9* 25.6* 23.8* 23.7*  MCV 94.2 93.4  --  91.2  --   --  90.8  PLT 363 363  --  342  --   --  271     No results found for: "HEPBSAG",  "HEPBSAB", "HEPBIGM"    Microbiology:  Recent Results (from the past 240 hour(s))  MRSA Next Gen by PCR, Nasal     Status: None   Collection Time: 05/02/23  5:32 PM   Specimen: Nasal Mucosa; Nasal Swab  Result Value Ref Range Status   MRSA by PCR Next Gen NOT DETECTED NOT DETECTED Final    Comment: (NOTE) The GeneXpert MRSA Assay (FDA approved for NASAL specimens only), is one component of a comprehensive MRSA colonization surveillance program. It is not intended to diagnose MRSA infection nor to guide or monitor treatment for MRSA infections. Test performance is not FDA approved in patients less than 49 years old. Performed at [facility], [address] [place], [place] [address]   SARS Coronavirus 2 by RT PCR (hospital order, performed in [facility] hospital lab) *cepheid single result test* Anterior Nasal Swab     Status: None   Collection Time: 05/02/23  9:04 PM   Specimen: Anterior Nasal Swab  Result Value Ref Range Status   SARS Coronavirus 2 by RT PCR NEGATIVE NEGATIVE Final    Comment: (NOTE) SARS-CoV-2 target nucleic acids are NOT DETECTED.  The SARS-CoV-2 RNA is generally detectable in upper and lower respiratory specimens during the acute phase of infection. The lowest concentration of SARS-CoV-2 viral copies this assay can detect is 250 copies / mL. A negative result does not preclude SARS-CoV-2 infection and should not be used as the sole basis for treatment or other patient management decisions.  A negative result may occur with improper specimen collection / handling, submission of specimen other than nasopharyngeal swab, presence of viral mutation(s) within the areas targeted by this assay, and inadequate number of viral copies (<250 copies / mL). A negative result must be combined with clinical observations, patient history, and epidemiological information.  Fact Sheet for Patients:   RoadLapTop.co.za  Fact Sheet for  Healthcare Providers: http://kim-miller.com/  This test is not yet approved or  cleared by the [place] FDA and has been authorized for detection and/or diagnosis of SARS-CoV-2 by FDA under an Emergency Use Authorization (EUA).  This EUA will remain in effect (meaning this test can be used) for the duration of the COVID-19 declaration under Section 564(b)(1) of the Act, 21 U.S.C. section 360bbb-3(b)(1), unless the authorization is terminated or revoked sooner.  Performed at [facility], [address] [place], [place] [address]   Respiratory (~20 pathogens) panel by PCR     Status: None   Collection Time: 05/02/23  9:04 PM   Specimen: Nasopharyngeal Swab; Respiratory  Result Value Ref Range Status   Adenovirus NOT DETECTED NOT DETECTED Final  Coronavirus 229E NOT DETECTED NOT DETECTED Final    Comment: (NOTE) The Coronavirus on the Respiratory Panel, DOES NOT test for the novel  Coronavirus (2019 nCoV)    Coronavirus HKU1 NOT DETECTED NOT DETECTED Final   Coronavirus NL63 NOT DETECTED NOT DETECTED Final   Coronavirus OC43 NOT DETECTED NOT DETECTED Final   Metapneumovirus NOT DETECTED NOT DETECTED Final   Rhinovirus / Enterovirus NOT DETECTED NOT DETECTED Final   Influenza A NOT DETECTED NOT DETECTED Final   Influenza B NOT DETECTED NOT DETECTED Final   Parainfluenza Virus 1 NOT DETECTED NOT DETECTED Final   Parainfluenza Virus 2 NOT DETECTED NOT DETECTED Final   Parainfluenza Virus 3 NOT DETECTED NOT DETECTED Final   Parainfluenza Virus 4 NOT DETECTED NOT DETECTED Final   Respiratory Syncytial Virus NOT DETECTED NOT DETECTED Final   Bordetella pertussis NOT DETECTED NOT DETECTED Final   Bordetella Parapertussis NOT DETECTED NOT DETECTED Final   Chlamydophila pneumoniae NOT DETECTED NOT DETECTED Final   Mycoplasma pneumoniae NOT DETECTED NOT DETECTED Final    Comment: Performed at [facility], [address] [place], [place]  [address]  Culture, blood (Routine X 2) w Reflex to ID Panel     Status: None (Preliminary result)   Collection Time: 05/02/23 10:05 PM   Specimen: BLOOD  Result Value Ref Range Status   Specimen Description BLOOD RIGHT WRIST  Final   Special Requests   Final    BOTTLES DRAWN AEROBIC AND ANAEROBIC Blood Culture adequate volume   Culture   Final    NO GROWTH 2 DAYS Performed at [facility], [address] [place], [place] [address]    Report Status PENDING  Incomplete  Culture, blood (Routine X 2) w Reflex to ID Panel     Status: None (Preliminary result)   Collection Time: 05/02/23 10:05 PM   Specimen: BLOOD  Result Value Ref Range Status   Specimen Description BLOOD RIGHT HAND  Final   Special Requests   Final    BOTTLES DRAWN AEROBIC ONLY Blood Culture adequate volume   Culture   Final    NO GROWTH 2 DAYS Performed at [facility], [address], [place], [place] [address]    Report Status PENDING  Incomplete    Coagulation Studies: Recent Labs    05/02/23 1516 05/03/23 0532  LABPROT 17.6* 17.2*  INR 1.4* 1.4*    Urinalysis: Recent Labs    05/02/23 2217  COLORURINE AMBER*  LABSPEC 1.020  PHURINE 5.0  GLUCOSEU NEGATIVE  HGBUR NEGATIVE  BILIRUBINUR NEGATIVE  KETONESUR NEGATIVE  PROTEINUR >=300*  NITRITE NEGATIVE  LEUKOCYTESUR NEGATIVE      Imaging: ECHOCARDIOGRAM COMPLETE  Result Date: 05/03/2023    ECHOCARDIOGRAM REPORT   Patient Name:   SR. Jabin A Zuniga Date of Exam: 05/03/2023 Medical Rec #:  [id number]         Height:       70.0 in Accession #:    [id number]        Weight:       191.1 lb Date of Birth:  [birth year]         BSA:          2.048 m Patient Age:    82 years          BP:           106/63 mmHg Patient Gender: M  HR:           85 bpm. Exam Location:  [facility] Procedure: 2D Echo, Cardiac Doppler and Color Doppler Indications:     Chest Pain  History:         Patient has prior history of Echocardiogram examinations, most                   recent 11/20/2007. Signs/Symptoms:Chest Pain and                  Dizziness/Lightheadedness; Risk Factors:Hypertension and                  Dyslipidemia. CKD, Acute Pericarditus.  Sonographer:     Mikki Harbor Referring Phys:  [id number] Antonieta Iba Diagnosing Phys: Julien Nordmann MD IMPRESSIONS  1. Left ventricular ejection fraction, by estimation, is 40 to 45%. The left ventricle has mildly decreased function. The left ventricle demonstrates global hypokinesis. There is mild left ventricular hypertrophy. Left ventricular diastolic parameters are indeterminate.  2. Right ventricular systolic function is mildly reduced. The right ventricular size is normal. There is mildly elevated pulmonary artery systolic pressure. The estimated right ventricular systolic pressure is 41.8 mmHg.  3. Left atrial size was moderately dilated.  4. The mitral valve is normal in structure. Mild mitral valve regurgitation. No evidence of mitral stenosis.  5. Tricuspid valve regurgitation is mild to moderate.  6. The aortic valve is normal in structure. Aortic valve regurgitation is not visualized. No aortic stenosis is present.  7. The inferior vena cava is dilated in size with <50% respiratory variability, suggesting right atrial pressure of 15 mmHg. FINDINGS  Left Ventricle: Left ventricular ejection fraction, by estimation, is 40 to 45%. The left ventricle has mildly decreased function. The left ventricle demonstrates global hypokinesis. The left ventricular internal cavity size was normal in size. There is  mild left ventricular hypertrophy. Left ventricular diastolic parameters are indeterminate. Right Ventricle: The right ventricular size is normal. No increase in right ventricular wall thickness. Right ventricular systolic function is mildly reduced. There is mildly elevated pulmonary artery systolic pressure. The tricuspid regurgitant velocity  is 2.59 m/s, and with an assumed right atrial pressure of 15 mmHg,  the estimated right ventricular systolic pressure is 41.8 mmHg. Left Atrium: Left atrial size was moderately dilated. Right Atrium: Right atrial size was normal in size. Pericardium: There is no evidence of pericardial effusion. Mitral Valve: The mitral valve is normal in structure. Mild mitral valve regurgitation. No evidence of mitral valve stenosis. MV peak gradient, 7.8 mmHg. The mean mitral valve gradient is 2.0 mmHg. Tricuspid Valve: The tricuspid valve is normal in structure. Tricuspid valve regurgitation is mild to moderate. No evidence of tricuspid stenosis. Aortic Valve: The aortic valve is normal in structure. Aortic valve regurgitation is not visualized. No aortic stenosis is present. Aortic valve mean gradient measures 5.7 mmHg. Aortic valve peak gradient measures 9.8 mmHg. Aortic valve area, by VTI measures 2.11 cm. Pulmonic Valve: The pulmonic valve was normal in structure. Pulmonic valve regurgitation is not visualized. No evidence of pulmonic stenosis. Aorta: The aortic root is normal in size and structure. Venous: The inferior vena cava is dilated in size with less than 50% respiratory variability, suggesting right atrial pressure of 15 mmHg. IAS/Shunts: No atrial level shunt detected by color flow Doppler.  LEFT VENTRICLE PLAX 2D LVIDd:         4.50 cm   Diastology LVIDs:         3.30  cm   LV e' medial:   124.00 cm/s LV PW:         1.40 cm   LV E/e' medial: 1.0 LV IVS:        1.30 cm LVOT diam:     2.00 cm LV SV:         62 LV SV Index:   30 LVOT Area:     3.14 cm  RIGHT VENTRICLE RV Basal diam:  4.40 cm RV Mid diam:    4.10 cm LEFT ATRIUM              Index        RIGHT ATRIUM           Index LA diam:        4.00 cm  1.95 cm/m   RA Area:     29.10 cm LA Vol (A2C):   143.0 ml 69.84 ml/m  RA Volume:   103.00 ml 50.31 ml/m LA Vol (A4C):   117.0 ml 57.14 ml/m LA Biplane Vol: 132.0 ml 64.47 ml/m  AORTIC VALVE                     PULMONIC VALVE AV Area (Vmax):    2.42 cm      PV Vmax:        1.02 m/s AV Area (Vmean):   1.88 cm      PV Peak grad:  4.2 mmHg AV Area (VTI):     2.11 cm AV Vmax:           156.33 cm/s AV Vmean:          114.000 cm/s AV VTI:            0.295 m AV Peak Grad:      9.8 mmHg AV Mean Grad:      5.7 mmHg LVOT Vmax:         120.50 cm/s LVOT Vmean:        68.400 cm/s LVOT VTI:          0.198 m LVOT/AV VTI ratio: 0.67  AORTA Ao Root diam: 3.90 cm MITRAL VALVE                TRICUSPID VALVE MV Area (PHT): 3.48 cm     TR Peak grad:   26.8 mmHg MV Area VTI:   2.25 cm     TR Vmax:        259.00 cm/s MV Peak grad:  7.8 mmHg MV Mean grad:  2.0 mmHg     SHUNTS MV Vmax:       1.40 m/s     Systemic VTI:  0.20 m MV Vmean:      62.9 cm/s    Systemic Diam: 2.00 cm MV Decel Time: 218 msec MV E velocity: 123.00 cm/s Julien Nordmann MD Electronically signed by Julien Nordmann MD Signature Date/Time: 05/03/2023/5:23:19 PM    Final (Updated)    US Venous Img Upper Uni Right(DVT)  Result Date: 05/03/2023 CLINICAL DATA:  Right upper extremity pain and edema. Evaluate for DVT. EXAM: RIGHT UPPER EXTREMITY VENOUS DOPPLER ULTRASOUND TECHNIQUE: Gray-scale sonography with graded compression, as well as color Doppler and duplex ultrasound were performed to evaluate the upper extremity deep venous system from the level of the subclavian vein and including the jugular, axillary, basilic, radial, ulnar and upper cephalic vein. Spectral Doppler was utilized to evaluate flow at rest and with distal augmentation maneuvers. COMPARISON:  None Available. FINDINGS: Contralateral  Subclavian Vein: Respiratory phasicity is normal and symmetric with the symptomatic side. No evidence of thrombus. Normal compressibility. Internal Jugular Vein: No evidence of thrombus. Normal compressibility, respiratory phasicity and response to augmentation. Subclavian Vein: No evidence of thrombus. Normal compressibility, respiratory phasicity and response to augmentation. Axillary Vein: No evidence of thrombus. Normal  compressibility, respiratory phasicity and response to augmentation. Cephalic Vein: No evidence of thrombus. Normal compressibility, respiratory phasicity and response to augmentation. Basilic Vein: No evidence of thrombus. Normal compressibility, respiratory phasicity and response to augmentation. Brachial Veins: No evidence of thrombus. Normal compressibility, respiratory phasicity and response to augmentation. Radial Veins: No evidence of thrombus. Normal compressibility, respiratory phasicity and response to augmentation. Ulnar Veins: No evidence of thrombus. Normal compressibility, respiratory phasicity and response to augmentation. Other Findings: There is a minimal amount of subcutaneous edema at the level of the right upper arm and forearm. IMPRESSION: No evidence of DVT within the right upper extremity. Electronically Signed   By: Simonne Come M.D.   On: 05/03/2023 12:55   US Venous Img Lower Bilateral (DVT)  Result Date: 05/03/2023 EXAM: BILATERAL LOWER EXTREMITY VENOUS DOPPLER ULTRASOUND TECHNIQUE: Gray-scale sonography with graded compression, as well as color Doppler and duplex ultrasound were performed to evaluate the lower extremity deep venous systems from the level of the common femoral vein and including the common femoral, femoral, profunda femoral, popliteal and calf veins including the posterior tibial, peroneal and gastrocnemius veins when visible. The superficial great saphenous vein was also interrogated. Spectral Doppler was utilized to evaluate flow at rest and with distal augmentation maneuvers in the common femoral, femoral and popliteal veins. COMPARISON:  None Available. FINDINGS: RIGHT LOWER EXTREMITY Common Femoral Vein: No evidence of thrombus. Normal compressibility, respiratory phasicity and response to augmentation. Saphenofemoral Junction: No evidence of thrombus. Normal compressibility and flow on color Doppler imaging. Profunda Femoral Vein: No evidence of thrombus. Normal  compressibility and flow on color Doppler imaging. Femoral Vein: No evidence of thrombus. Normal compressibility, respiratory phasicity and response to augmentation. Popliteal Vein: No evidence of thrombus. Normal compressibility, respiratory phasicity and response to augmentation. Calf Veins: No evidence of thrombus. Normal compressibility and flow on color Doppler imaging. Superficial Great Saphenous Vein: No evidence of thrombus. Normal compressibility. Venous Reflux:  None. Other Findings:  None. LEFT LOWER EXTREMITY Common Femoral Vein: No evidence of thrombus. Normal compressibility, respiratory phasicity and response to augmentation. Saphenofemoral Junction: No evidence of thrombus. Normal compressibility and flow on color Doppler imaging. Profunda Femoral Vein: No evidence of thrombus. Normal compressibility and flow on color Doppler imaging. Femoral Vein: No evidence of thrombus. Normal compressibility, respiratory phasicity and response to augmentation. Popliteal Vein: No evidence of thrombus. Normal compressibility, respiratory phasicity and response to augmentation. Calf Veins: No evidence of thrombus. Normal compressibility and flow on color Doppler imaging. Superficial Great Saphenous Vein: No evidence of thrombus. Normal compressibility. Venous Reflux:  None. Other Findings:  None. IMPRESSION: No evidence of deep venous thrombosis in either lower extremity. Electronically Signed   By: Malachy Moan M.D.   On: 05/03/2023 06:27   DG Chest Port 1 View  Result Date: 05/03/2023 CLINICAL DATA:  Hypoxia EXAM: PORTABLE CHEST 1 VIEW COMPARISON:  Chest x-ray 07/08/2015 FINDINGS: Heart is borderline enlarged. There is no focal lung infiltrate, pleural effusion or pneumothorax. Right shoulder arthroplasty is present. No acute fractures are seen. IMPRESSION: Borderline cardiomegaly. No active disease. Electronically Signed   By: Darliss Cheney M.D.   On: 05/03/2023 00:54   CT ABDOMEN PELVIS WO  CONTRAST  Result Date: 05/02/2023 CLINICAL DATA:  Acute nonlocalized abdominal pain. Myelofibrosis, chronic renal insufficiency EXAM: CT ABDOMEN AND PELVIS WITHOUT CONTRAST TECHNIQUE: Multidetector CT imaging of the abdomen and pelvis was performed following the standard protocol without IV contrast. RADIATION DOSE REDUCTION: This exam was performed according to the departmental dose-optimization program which includes automated exposure control, adjustment of the mA and/or kV according to patient size and/or use of iterative reconstruction technique. COMPARISON:  11/14/2016 FINDINGS: Lower chest: Small right and trace left pleural effusions are present associated bibasilar atelectasis. Small pericardial effusion. Global cardiac size is mildly enlarged. Hypoattenuation of the cardiac blood pool is in keeping with at least mild anemia. Hepatobiliary: Mild periportal edema. No intra or extrahepatic biliary ductal dilation. Liver otherwise unremarkable on this noncontrast examination. Gallbladder unremarkable. Pancreas: Unremarkable. No pancreatic ductal dilatation or surrounding inflammatory changes. Spleen: Moderate splenomegaly noted with the spleen measuring a 2.6 cm in greatest dimension. No definite intrasplenic lesion seen on this noncontrast examination. Adrenals/Urinary Tract: The adrenal glands are unremarkable. There has, however, developed infiltration within the retroperitoneum surrounding the adrenal glands reflecting retroperitoneal edema as well as shotty retroperitoneal adenopathy within the periportal, left periaortic, and aortocaval lymph node groups, possibly reactive in nature or indicative of a low-grade lymphoproliferative process. Numerous simple and minimally complex renal cysts are seen bilaterally for which no follow-up imaging is recommended. No urinary renal or ureteral calculi. No hydronephrosis. The bladder is unremarkable save for a small amount dependently layering excreted contrast.  Stomach/Bowel: Pancolonic diverticulosis, severe within the sigmoid colon, without superimposed acute inflammatory change. Stomach, small-bowel, and large bowel are otherwise unremarkable. Appendix normal. No free intraperitoneal gas or fluid. Vascular/Lymphatic: Extensive aortoiliac atherosclerotic calcification. No aortic aneurysm. Near occlusive atherosclerotic calcification left common femoral artery. Aside from a previously mentioned shotty adenopathy within the upper retroperitoneum, no pathologic adenopathy within the abdomen and pelvis. Reproductive: Prostate is unremarkable. Other: Interval development of diffuse subcutaneous body wall edema. No abdominal wall hernia Musculoskeletal: The osseous structures are diffusely mildly sclerotic in keeping with the given history of myelofibrosis. No acute bone abnormality. No focal lytic or blastic bone lesion. Osseous structures are otherwise age-appropriate. IMPRESSION: 1. Interval development of small right and trace left pleural effusions, small pericardial effusion, retroperitoneal edema and diffuse subcutaneous body wall edema, in keeping with anasarca. 2. Stable moderate splenomegaly. Diffuse osseous sclerosis. Findings are in keeping with changes of myelofibrosis. 3. Interval development of shotty retroperitoneal adenopathy within the periportal, left periaortic, and aortocaval lymph node groups, possibly reactive in nature or indicative of a low-grade lymphoproliferative process. 4. Pancolonic diverticulosis, severe within the sigmoid colon, without superimposed acute inflammatory change. Aortic Atherosclerosis (ICD10-I70.0). Electronically Signed   By: Helyn Numbers M.D.   On: 05/02/2023 22:51   CARDIAC CATHETERIZATION  Result Date: 05/02/2023 Conclusions: Mild, non-obstructive coronary artery disease. Mildly elevated left ventricular filling pressure. Bedside ultrasound with mildly reduced LVEF with global hypokinesis and small pericardial effusion.   Query pericarditis/myocarditis. Recommendations: Admit to stepdown. Start amiodarone bolus/infusion for atrial fibrillation with rapid ventricular response. PRBC transfusion for target hemoglobin greater than 8. If hemoglobin stable and no enlargement of pericardial effusion, will need to consider heparin infusion in the setting of a-fib with RVR. Yvonne Kendall, MD [facility]    Medications:    ceFEPime (MAXIPIME) IV Stopped (05/03/23 2148)   phenylephrine (NEO-SYNEPHRINE) Adult infusion Stopped (05/03/23 1728)   sodium bicarbonate 150 mEq in dextrose 5 % 1,150 mL infusion 125 mL/hr at 05/04/23 0932    sodium  chloride   Intravenous Once   bisacodyl  10 mg Rectal Once   Chlorhexidine Gluconate Cloth  6 each Topical Daily   feeding supplement  237 mL Oral TID BM   heparin  5,000 Units Subcutaneous Q8H   levothyroxine  25 mcg Oral Q0600   lidocaine  1 patch Transdermal Q24H   multivitamin  1 tablet Oral QHS   pantoprazole  40 mg Oral Daily   polyethylene glycol  17 g Oral Daily   predniSONE  20 mg Oral Q breakfast   simvastatin  40 mg Oral Daily   sodium chloride flush  3 mL Intravenous Q12H   acetaminophen, bisacodyl, docusate sodium, melatonin, ondansetron (ZOFRAN) IV, mouth rinse, polyethylene glycol, sodium chloride flush  Assessment/ Plan:  82 y.o. 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  Baseline creatinine of 2.5/GFR 25 on April 21, 2023 AKI is likely multifactorial.  Urinalysis from May 02, 2023 shows proteinuria, negative for blood.  0-5 RBCs.  0-5 WBCs.  Low likelihood for acute glomerulonephritis or acute interstitial nephritis. He did  present with hypotension which may have led to ATN causing AKI which was subsequently exacerbated by IV contrast exposure from cardiac catheterization. Urine output of about 500 cc yesterday. His electrolytes and volume status are acceptable at present although tenuous. No acute indication for dialysis at present but will follow closely on a daily basis. -Maintain hemodynamic stability and avoid hypotension. -Avoid IV contrast, nonsteroidals. -Agree to hold hydralazine, Farxiga. -Creatinine BUN may have peaked and urine but appears to be improving.  Hopefully we will see renal recovery soon.  #Acute pericarditis ANA, complements negative.  Low likelihood of autoimmune disease. Other differential includes viral myocarditis versus other causes. Currently getting treatment with steroids.  He was given a dose of colchicine 0.3 mg yesterday. Low likelihood of uremic pericarditis in the setting of relatively recent onset of AKI  #Hyperkalemia Lokelma can be given as needed.  #Proteinuria Patient is noted to have proteinuria even as outpatient. Previous autoimmune workup in June 2024 was negative. Urine protein to creatinine ratio 4.7 g In October 2024. He has known numerous simple and minimally complex renal cysts bilaterally.  CT abdomen from May 02, 2023 is negative for ureteral calculi, hydronephrosis.    LOS: 2 Kaylee Trivett Thedore Mins 11/28/202411:47 AM  [facility] [place], [place] [phone]  Note: This note was prepared with Dragon dictation. Any transcription errors are unintentional

## 2023-05-04 NOTE — Evaluation (Signed)
Physical Therapy Evaluation Patient Details Name: [name] MRN: [id number] DOB: September 18, 1940 Today's Date: 05/04/2023  History of Present Illness  LENYX Francis is a 82 y.o. male with medical history significant of chronic HFpEF, hypertension, hyperlipidemia, myelofibrosis, CKD stage IIIb, anemia of chronic disease, who presented initially to [facility] ED as a STEMI alert.  Clinical Impression  Patient admitted with the above. 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 Grandchildren present during session and supportive. Patient presents with weakness, impaired balance, and decreased activity tolerance. Patient required modA for bed mobility and sit to stand with HHAx1. Able to take side steps towards [facility] with HHAx1 and minA. SBP in sitting 127. After returning to supine, BP 98/75. Asymptomatic throughout. Patient will benefit from skilled PT services during acute stay to address listed deficits. Patient will benefit from ongoing therapy at discharge to maximize functional independence and safety.         If plan is discharge home, recommend the following: A lot of help with walking and/or transfers;A lot of help with bathing/dressing/bathroom;Assistance with cooking/housework;Assist for transportation;Help with stairs or ramp for entrance   Can travel by private vehicle        Equipment Recommendations Other (comment) (TBD)  Recommendations for Other Services       Functional Status Assessment Patient has had a recent decline in their functional status and demonstrates the ability to make significant improvements in function in a reasonable and predictable amount of time.     Precautions / Restrictions Precautions Precautions: Fall;Shoulder Type of Shoulder Precautions: shoulder abd pillow at all times Shoulder Interventions: Shoulder sling/immobilizer;Shoulder abduction pillow;At all times;Off for dressing/bathing/exercises Restrictions Weight  Bearing Restrictions: Yes RUE Weight Bearing: Non weight bearing Other Position/Activity Restrictions: elbow/wrist/hand ROM ok      Mobility  Bed Mobility Overal bed mobility: Needs Assistance Bed Mobility: Supine to Sit, Sit to Supine     Supine to sit: Mod assist, HOB elevated Sit to supine: Max assist, HOB elevated        Transfers Overall transfer level: Needs assistance Equipment used: 1 person hand held assist Transfers: Sit to/from Stand Sit to Stand: Mod assist           General transfer comment: required modA to stand from EOB with HHA and supporting of R shoulder    Ambulation/Gait Ambulation/Gait assistance: Min assist Gait Distance (Feet): 3 Feet Assistive device: 1 person hand held assist Gait Pattern/deviations: Step-to pattern Gait velocity: decreased     General Gait Details: sidesteps at EOB with HHAx1 and assist to balance. Cues for sequencing  Stairs            Wheelchair Mobility     Tilt Bed    Modified Rankin (Stroke Patients Only)       Balance Overall balance assessment: Needs assistance Sitting-balance support: No upper extremity supported, Feet supported Sitting balance-Leahy Scale: Normal     Standing balance support: Single extremity supported, During functional activity Standing balance-Leahy Scale: Poor                               Pertinent Vitals/Pain Pain Assessment Pain Assessment: Faces Faces Pain Scale: Hurts even more Pain Location: R elbow down to the hand Pain Descriptors / Indicators: Aching, Grimacing, Discomfort, Tightness, Radiating Pain Intervention(s): Limited activity within patient's tolerance, Monitored during session, Repositioned    Home Living Family/patient expects to be discharged to:: Private  residence Living Arrangements: Spouse/significant other Available Help at Discharge: Family;Available 24 hours/day;Available PRN/intermittently Type of Home: House Home Access:  Stairs to enter Entrance Stairs-Rails: Right;Left Entrance Stairs-Number of Steps: 4   Home Layout: Multi-level;Able to live on main level with bedroom/bathroom Home Equipment: Other (comment) Additional Comments: *wide base QC    Prior Function Prior Level of Function : Independent/Modified Independent;Driving;History of Falls (last six months)             Mobility Comments: WB QC but reports he can walk without it, driving, active per pt report; Pt reports 5-6 falls in past 6 months ADLs Comments: Indep with ADLs but limited with RUE due to pain prior to surgery     Extremity/Trunk Assessment   Upper Extremity Assessment Upper Extremity Assessment: Defer to OT evaluation    Lower Extremity Assessment Lower Extremity Assessment: Generalized weakness    Cervical / Trunk Assessment Cervical / Trunk Assessment: Normal  Communication   Communication Communication: Hearing impairment  Cognition Arousal: Alert Behavior During Therapy: WFL for tasks assessed/performed Overall Cognitive Status: Within Functional Limits for tasks assessed                                          General Comments General comments (skin integrity, edema, etc.): On 2L O2, SpO2 >90%. BP in sitting after mobility SBP 127. After returning to supine, BP dropped to 98/75    Exercises     Assessment/Plan    PT Assessment Patient needs continued PT services  PT Problem List Decreased strength;Decreased mobility;Decreased range of motion;Decreased activity tolerance;Decreased balance;Decreased coordination;Pain;Decreased knowledge of use of DME;Decreased knowledge of precautions       PT Treatment Interventions DME instruction;Therapeutic exercise;Functional mobility training;Stair training;Gait training;Therapeutic activities;Balance training;Patient/family education    PT Goals (Current goals can be found in the Care Plan section)  Acute Rehab PT Goals Patient Stated Goal: to  get stronger PT Goal Formulation: With patient Time For Goal Achievement: 05/18/23 Potential to Achieve Goals: Good    Frequency Min 1X/week     Co-evaluation               AM-PAC PT "6 Clicks" Mobility  Outcome Measure Help needed turning from your back to your side while in a flat bed without using bedrails?: A Lot Help needed moving from lying on your back to sitting on the side of a flat bed without using bedrails?: A Lot Help needed moving to and from a bed to a chair (including a wheelchair)?: A Lot Help needed standing up from a chair using your arms (e.g., wheelchair or bedside chair)?: A Lot Help needed to walk in hospital room?: A Lot Help needed climbing 3-5 steps with a railing? : Total 6 Click Score: 11    End of Session Equipment Utilized During Treatment: Oxygen Activity Tolerance: Patient tolerated treatment well Patient left: in bed;with call bell/phone within reach;with family/visitor present Nurse Communication: Mobility status PT Visit Diagnosis: Unsteadiness on feet (R26.81);History of falling (Z91.81);Repeated falls (R29.6);Muscle weakness (generalized) (M62.81);Pain    Time: 4098-1191 PT Time Calculation (min) (ACUTE ONLY): 23 min   Charges:   PT Evaluation $PT Eval Moderate Complexity: 1 Mod PT Treatments $Therapeutic Activity: 8-22 mins PT General Charges $$ ACUTE PT VISIT: 1 Visit         Maylon Peppers, PT, DPT Physical Therapist - [place]  [facility]  Stevens Magwood A Marcques Wrightsman 05/04/2023, 2:23 PM

## 2023-05-05 DIAGNOSIS — N179 Acute kidney failure, unspecified: Secondary | ICD-10-CM | POA: Diagnosis not present

## 2023-05-05 DIAGNOSIS — N170 Acute kidney failure with tubular necrosis: Secondary | ICD-10-CM | POA: Diagnosis not present

## 2023-05-05 DIAGNOSIS — N189 Chronic kidney disease, unspecified: Secondary | ICD-10-CM | POA: Diagnosis not present

## 2023-05-05 DIAGNOSIS — D469 Myelodysplastic syndrome, unspecified: Secondary | ICD-10-CM | POA: Diagnosis not present

## 2023-05-05 DIAGNOSIS — I309 Acute pericarditis, unspecified: Secondary | ICD-10-CM | POA: Diagnosis not present

## 2023-05-05 DIAGNOSIS — I5021 Acute systolic (congestive) heart failure: Secondary | ICD-10-CM

## 2023-05-05 DIAGNOSIS — E663 Overweight: Secondary | ICD-10-CM | POA: Insufficient documentation

## 2023-05-05 LAB — CBC
HCT: 23.7 % — ABNORMAL LOW (ref 39.0–52.0)
Hemoglobin: 7.7 g/dL — ABNORMAL LOW (ref 13.0–17.0)
MCH: 28.6 pg (ref 26.0–34.0)
MCHC: 32.5 g/dL (ref 30.0–36.0)
MCV: 88.1 fL (ref 80.0–100.0)
Platelets: 248 10*3/uL (ref 150–400)
RBC: 2.69 MIL/uL — ABNORMAL LOW (ref 4.22–5.81)
RDW: 18.8 % — ABNORMAL HIGH (ref 11.5–15.5)
WBC: 5.8 10*3/uL (ref 4.0–10.5)
nRBC: 0.5 % — ABNORMAL HIGH (ref 0.0–0.2)

## 2023-05-05 LAB — BASIC METABOLIC PANEL
Anion gap: 15 (ref 5–15)
BUN: 89 mg/dL — ABNORMAL HIGH (ref 8–23)
CO2: 20 mmol/L — ABNORMAL LOW (ref 22–32)
Calcium: 6.7 mg/dL — ABNORMAL LOW (ref 8.9–10.3)
Chloride: 99 mmol/L (ref 98–111)
Creatinine, Ser: 5.01 mg/dL — ABNORMAL HIGH (ref 0.61–1.24)
GFR, Estimated: 11 mL/min — ABNORMAL LOW (ref >60)
Glucose, Bld: 150 mg/dL — ABNORMAL HIGH (ref 70–99)
Potassium: 4.5 mmol/L (ref 3.5–5.1)
Sodium: 134 mmol/L — ABNORMAL LOW (ref 135–145)

## 2023-05-05 LAB — PREPARE RBC (CROSSMATCH)

## 2023-05-05 LAB — MICROALBUMIN / CREATININE URINE RATIO
Creatinine, Urine: 183.3 mg/dL
Microalb Creat Ratio: 516 mg/g{creat} — ABNORMAL HIGH (ref 0–29)
Microalb, Ur: 946.1 ug/mL — ABNORMAL HIGH

## 2023-05-05 LAB — MAGNESIUM: Magnesium: 1.9 mg/dL (ref 1.7–2.4)

## 2023-05-05 LAB — PHOSPHORUS: Phosphorus: 6.6 mg/dL — ABNORMAL HIGH (ref 2.5–4.6)

## 2023-05-05 LAB — GLUCOSE, CAPILLARY
Glucose-Capillary: 127 mg/dL — ABNORMAL HIGH (ref 70–99)
Glucose-Capillary: 143 mg/dL — ABNORMAL HIGH (ref 70–99)
Glucose-Capillary: 157 mg/dL — ABNORMAL HIGH (ref 70–99)

## 2023-05-05 LAB — HEPARIN LEVEL (UNFRACTIONATED): Heparin Unfractionated: <0.1 [IU]/mL — ABNORMAL LOW (ref 0.30–0.70)

## 2023-05-05 LAB — PROCALCITONIN: Procalcitonin: 0.84 ng/mL

## 2023-05-05 MED ORDER — HEPARIN BOLUS VIA INFUSION
2400.0000 [IU] | Freq: Once | INTRAVENOUS | Status: AC
Start: 2023-05-05 — End: 2023-05-05
  Administered 2023-05-05: 2400 [IU] via INTRAVENOUS
  Filled 2023-05-05: qty 2400

## 2023-05-05 MED ORDER — METOPROLOL SUCCINATE ER 25 MG PO TB24
12.5000 mg | ORAL_TABLET | Freq: Every day | ORAL | Status: DC
  Administered 2023-05-05 – 2023-05-06 (×2): 12.5 mg via ORAL
  Filled 2023-05-05 (×2): qty 0.5

## 2023-05-05 MED ORDER — HEPARIN (PORCINE) 25000 UT/250ML-% IV SOLN
2050.0000 [IU]/h | INTRAVENOUS | Status: DC
  Administered 2023-05-05: 1100 [IU]/h via INTRAVENOUS
  Administered 2023-05-06: 1750 [IU]/h via INTRAVENOUS
  Administered 2023-05-06: 1600 [IU]/h via INTRAVENOUS
  Administered 2023-05-07 (×2): 1900 [IU]/h via INTRAVENOUS
  Administered 2023-05-08: 2050 [IU]/h via INTRAVENOUS
  Filled 2023-05-05 (×6): qty 250

## 2023-05-05 MED ORDER — SODIUM CHLORIDE 0.9% IV SOLUTION: Freq: Once | INTRAVENOUS | Status: AC

## 2023-05-05 NOTE — Progress Notes (Addendum)
Progress Note   Patient: [name] ZOX:[id number] DOB: 05-Oct-1940 DOA: 05/02/2023     3 DOS: the patient was seen and examined on 05/05/2023   Brief hospital course: MARCELO MCCONNEL is a 82 y.o. male with medical history significant of chronic HFpEF, hypertension, hyperlipidemia, myelofibrosis, CKD stage IIIb, anemia of chronic disease, who presented initially to [facility] ED with chest pain and shortness of breath.  Troponin 41, EKG showed ST elevation.  Patient was taken to the Cath Lab, did not show any occlusion in coronary arteries.  Condition consistent with acute pericarditis.  Treated with colchicine, then changed to prednisone. Also has significant worsening renal function at the time of admission, associated with hyperkalemia.  Patient was given IV fluids, Lokelma.  Followed by nephrology. 11/28.  Patient developed atrial fibrillation, seen by cardiology, placed on heparin drip. 11/29.  Transfuse 1 unit of PRBC.   Principal Problem:   Acute pericarditis Active Problems:   Acute renal failure with acute 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)   Anemia   Hypotension   Shock circulatory (HCC)   Normocytic anemia   Shock (HCC)   ST elevation myocardial infarction (STEMI) (HCC)   Tachycardia   Acute renal failure with tubular necrosis (HCC)   Atrial fibrillation with rapid ventricular response (HCC)   Assessment and Plan: Acute pericarditis. Chronic systolic congestive heart failure. STEMI ruled out.  Patient has been seen by cardiology, cardiac cath did not show significant coronary artery disease.  Patient has acute pericarditis.  She is treated with a lower dose of prednisone to 20 mg daily. Echocardiogram showed ejection fraction 40 to 45%, mild pulm hypertension.  Did not show any pericardial effusion. Patient is a followed by cardiology.  New onset atrial fibrillation with rapid ventricular response. Patient followed by cardiology,  started on heparin drip.  Myelodysplastic syndrome. Acute on chronic anemia. Patient hemoglobin dropped down to 7.7, given current cardiac condition, patient will be transfused with 1 unit PRBC.   Acute hypoxemic respiratory failure. Patient had a signal short of breath, potentially from pericarditis.  Oxygen saturation dropped down to 87%, patient on 2 L oxygen.  Patient is also followed by nephrology and cardiology for volume status.  Currently does not have volume overload.  Will continue to follow closely.   SIRS. Likely hypovolemic shock. Patient had a significant hypotension, resulting in severe acute kidney injury.  He also has a mild elevation of procalcitonin level, but could not identify any source of infection. He was placed on broad-spectrum antibiotics with vancomycin and cefepime.  Blood culture has no growth.  Procalcitonin level still elevated, I could not rule out a source of pneumonia.  Repeat chest x-ray still did not see a pneumonia.  Will continue cefepime to complete 5 days.  Procalcitonin level now at 0.8.   Acute renal failure on chronic kidney disease stage IIIb secondary to ATN. Hyperkalemia secondary to acute renal failure. Metabolic acidosis secondary to acute renal failure. Hyponatremia. Patient also received IV contrast, but renal function already worsened at the time of admission.  Worsening renal function appears to be secondary to  hypotension.  Patient was on IV pressor, was able to wean off last night. Blood pressure is more stable now.  Patient still has significant metabolic acidosis, potassium level has normalized.   Patient is a followed by nephrology, renal function is starting getting better.  Still on bicarb drip.     Anasarca. Right arm lymphedema. CT scan abdomen/pelvis  showed evidence of anasarca, this could be secondary to renal failure. Right arm lymphedema, no evidence of infection.  Seen by orthopedics.           Subjective:   Patient feels better today, seem to have been short of breath with exertion.  No cough.  Physical Exam: Vitals:   05/05/23 0700 05/05/23 0800 05/05/23 0900 05/05/23 1000  BP: 120/73 116/71 118/73 135/68  Pulse: 99 88 (!) 103 (!) 109  Resp: 18 18 (!) 22 (!) 22  Temp:  98.2 F (36.8 C)    TempSrc:  Oral    SpO2: 96% 97% 95% (!) 89%  Weight:      Height:       General exam: Appears calm and comfortable  Respiratory system: Clear to auscultation. Respiratory effort normal. Cardiovascular system: Irregular and hard cardiac. No JVD, murmurs, rubs, gallops or clicks.  Gastrointestinal system: Abdomen is nondistended, soft and nontender. No organomegaly or masses felt. Normal bowel sounds heard. Central nervous system: Alert and oriented. No focal neurological deficits. Extremities: Right arm edema. Skin: No rashes, lesions or ulcers Psychiatry: Judgement and insight appear normal. Mood & affect appropriate.    Data Reviewed:  Chest x-ray and lab results reviewed.  Family Communication: Wife updated at bedside.  Disposition: Status is: Inpatient Remains inpatient appropriate because: Severity of disease, IV treatment.  Will transfer patient to cardiac unit     Time spent: 55 minutes  Author: Marrion Coy, MD 05/05/2023 10:57 AM  For on call review [email or web address].

## 2023-05-05 NOTE — Progress Notes (Signed)
PHARMACY CONSULT NOTE - FOLLOW UP  Pharmacy Consult for Electrolyte Monitoring and Replacement   Recent Labs: Potassium (mmol/L)  Date Value  05/05/2023 4.5  06/18/2014 4.5   Magnesium (mg/dL)  Date Value  40/98/1191 1.9   Calcium (mg/dL)  Date Value  47/82/9562 6.7 (L)   Calcium, Total (mg/dL)  Date Value  13/01/6577 8.5   Albumin (g/dL)  Date Value  46/96/2952 2.3 (L)  09/13/2022 3.9  06/18/2014 3.9   Phosphorus (mg/dL)  Date Value  84/13/2440 6.6 (H)   Sodium (mmol/L)  Date Value  05/05/2023 134 (L)  11/01/2022 141  06/18/2014 141   Corrected Ca 8.1 mg/dL  Assessment:  82 y.o. male with medical history significant of chronic HFpEF, hypertension, hyperlipidemia, myelofibrosis, CKD stage IIIb, anemia of chronic disease, who presented initially to [facility] ED as a STEMI alert.   MIVF sodium bicarbonate 150 mEq in 5% dextrose  at 75 mL/hr  Goal of Therapy:  Potassium 4.0 - 5.1 mmol/L Magnesium 2.0 - 2.4 mg/dL All Other Electrolytes WNL  Plan:  ---no electrolyte replacement warranted for today ---recheck electrolytes in am   Lowella Bandy ,PharmD Clinical Pharmacist 05/05/2023 6:53 AM

## 2023-05-05 NOTE — Progress Notes (Signed)
[facility] [place], [place] 05/05/23  Subjective:   Hospital day # 3   Patient known to our practice from outpatient follow-up of CKD.  Last seen on October 30.  He was started on Farxiga at that time.  Previous autoimmune workup in June 2024 negative ANA, anti-GBM, PLA2R, ANCA.  Creatinine as outpatient was 2.58/GFR 24 from April 05, 2023.  Patient underwent reverse right total shoulder arthroplasty on April 13, 2023. He presented to the emergency room on November 11 for right knee pain and possible hallucinations.  He was admitted from 04/17/2023 and discharged on 04/21/2023 Creatinine at the time of discharge was 2.52/GFR 25 which is at his baseline. Potassium was mildly elevated at 5.5.  Hemoglobin was 8.3.  Losartan was stopped and he was started on hydralazine 25 mg 3 times per day.  He was also prescribed gabapentin 600 mg 3 times per day.  He presented to the emergency room for complaints of hard to arouse, generalized pain.  Triage notes indicate generalized weakness.  Blood pressure was 90/86 upon arrival with heart rate of 134.  He was noted to have elevated ST segment on EKG and thought to have STEMI.  He underwent emergent cardiac catheterization which showed mild nonobstructive coronary disease, mildly reduced LVEF with global hypokinesis and small pericardial effusion.  A question was raised about pericarditis versus myocarditis.  He was started on amiodarone for atrial fibrillation with RVR.  He was also noted to have elevated creatinine of 4.52 at admission which has increased further to 4.91 this morning. Urine output has decreased significantly.  Patient has a Foley catheter.  Patient seen earlier today.  Sons at bedside.  No significant change in clinical status since yesterday.  Creatinine and BUN are about the same.    Urine output of about 750 cc last 24 hours.  He has a Foley in place.  Requiring 2 L oxygen by nasal cannula.  Denies any nausea vomiting  or acute shortness of breath.    Objective:  Vital signs in last 24 hours:  Temp:  [97.5 F (36.4 C)-98.7 F (37.1 C)] 98.1 F (36.7 C) (11/29 1345) Pulse Rate:  [62-119] 100 (11/29 1400) Resp:  [14-25] 24 (11/29 1400) BP: (91-151)/(57-92) 136/89 (11/29 1400) SpO2:  [89 %-98 %] 94 % (11/29 1400) Weight:  [55.4 kg-87.8 kg] 87.8 kg (11/29 0500)  Weight change: -33.7 kg Filed Weights   05/04/23 0500 05/05/23 0457 05/05/23 0500  Weight: 89.1 kg 55.4 kg 87.8 kg    Intake/Output:    Intake/Output Summary (Last 24 hours) at 05/05/2023 1451 Last data filed at 05/05/2023 0800 Gross per 24 hour  Intake 1587.55 ml  Output 480 ml  Net 1107.55 ml     Physical Exam: General: Ill-appearing, sitting up bedside in chair  HEENT Moist oral mucous membranes  Pulm/lungs [place] O2, coarse breath sounds at bases, no crackles  CVS/Heart Irregular rhythm, tachycardic  Abdomen:  Soft, tympanic, distended  Extremities: Trace peripheral edema  Neurologic: Alert, able to answer simple questions  Skin: Warm, dry  Access:    Foley in place    Basic Metabolic Panel:  Recent Labs  Lab 05/02/23 2205 05/03/23 0532 05/03/23 1115 05/03/23 2309 05/04/23 0333 05/04/23 1559 05/05/23 0432  NA 133* 133* 133* 133* 131* 131* 134*  K 5.5* 5.5* 5.2* 4.9 4.8 4.6 4.5  CL 105 105 103 100 100 100 99  CO2 16* 19* 18* 19* 17* 18* 20*  GLUCOSE 124* 152* 124* 134* 123* 192*  150*  BUN 68* 70* 74* 78* 79* 84* 89*  CREATININE 4.78* 4.91* 4.88* 5.01* 5.07* 5.06* 5.01*  CALCIUM 7.2* 6.9* 6.9* 6.7* 6.7* 6.7* 6.7*  MG 1.9 2.0  --   --  2.0 2.0 1.9  PHOS 7.1* 7.6* 8.1*  --  7.7* 7.5* 6.6*     CBC: Recent Labs  Lab 05/02/23 1516 05/02/23 2205 05/03/23 0256 05/03/23 0532 05/03/23 1326 05/03/23 2309 05/04/23 0333 05/04/23 1147 05/05/23 0432  WBC 16.3* 16.9*  --  16.1*  --   --  7.1  --  5.8  NEUTROABS 12.0*  --   --   --   --   --   --   --   --   HGB 7.0* 7.3*   < > 7.8* 8.1* 7.7* 7.5* 7.7* 7.7*   HCT 22.8* 24.0*   < > 24.9* 25.6* 23.8* 23.7* 24.7* 23.7*  MCV 94.2 93.4  --  91.2  --   --  90.8  --  88.1  PLT 363 363  --  342  --   --  271  --  248   < > = values in this interval not displayed.     No results found for: "HEPBSAG", "HEPBSAB", "HEPBIGM"    Microbiology:  Recent Results (from the past 240 hour(s))  MRSA Next Gen by PCR, Nasal     Status: None   Collection Time: 05/02/23  5:32 PM   Specimen: Nasal Mucosa; Nasal Swab  Result Value Ref Range Status   MRSA by PCR Next Gen NOT DETECTED NOT DETECTED Final    Comment: (NOTE) The GeneXpert MRSA Assay (FDA approved for NASAL specimens only), is one component of a comprehensive MRSA colonization surveillance program. It is not intended to diagnose MRSA infection nor to guide or monitor treatment for MRSA infections. Test performance is not FDA approved in patients less than 83 years old. Performed at [facility], [address] [place], [place] [address]   SARS Coronavirus 2 by RT PCR (hospital order, performed in [facility] hospital lab) *cepheid single result test* Anterior Nasal Swab     Status: None   Collection Time: 05/02/23  9:04 PM   Specimen: Anterior Nasal Swab  Result Value Ref Range Status   SARS Coronavirus 2 by RT PCR NEGATIVE NEGATIVE Final    Comment: (NOTE) SARS-CoV-2 target nucleic acids are NOT DETECTED.  The SARS-CoV-2 RNA is generally detectable in upper and lower respiratory specimens during the acute phase of infection. The lowest concentration of SARS-CoV-2 viral copies this assay can detect is 250 copies / mL. A negative result does not preclude SARS-CoV-2 infection and should not be used as the sole basis for treatment or other patient management decisions.  A negative result may occur with improper specimen collection / handling, submission of specimen other than nasopharyngeal swab, presence of viral mutation(s) within the areas targeted by this assay, and inadequate  number of viral copies (<250 copies / mL). A negative result must be combined with clinical observations, patient history, and epidemiological information.  Fact Sheet for Patients:   RoadLapTop.co.za  Fact Sheet for Healthcare Providers: http://kim-miller.com/  This test is not yet approved or  cleared by the [place] FDA and has been authorized for detection and/or diagnosis of SARS-CoV-2 by FDA under an Emergency Use Authorization (EUA).  This EUA will remain in effect (meaning this test can be used) for the duration of the COVID-19 declaration under Section 564(b)(1) of the Act, 21  U.S.C. section 360bbb-3(b)(1), unless the authorization is terminated or revoked sooner.  Performed at [facility], [address] [place], [place] [address]   Respiratory (~20 pathogens) panel by PCR     Status: None   Collection Time: 05/02/23  9:04 PM   Specimen: Nasopharyngeal Swab; Respiratory  Result Value Ref Range Status   Adenovirus NOT DETECTED NOT DETECTED Final   Coronavirus 229E NOT DETECTED NOT DETECTED Final    Comment: (NOTE) The Coronavirus on the Respiratory Panel, DOES NOT test for the novel  Coronavirus (2019 nCoV)    Coronavirus HKU1 NOT DETECTED NOT DETECTED Final   Coronavirus NL63 NOT DETECTED NOT DETECTED Final   Coronavirus OC43 NOT DETECTED NOT DETECTED Final   Metapneumovirus NOT DETECTED NOT DETECTED Final   Rhinovirus / Enterovirus NOT DETECTED NOT DETECTED Final   Influenza A NOT DETECTED NOT DETECTED Final   Influenza B NOT DETECTED NOT DETECTED Final   Parainfluenza Virus 1 NOT DETECTED NOT DETECTED Final   Parainfluenza Virus 2 NOT DETECTED NOT DETECTED Final   Parainfluenza Virus 3 NOT DETECTED NOT DETECTED Final   Parainfluenza Virus 4 NOT DETECTED NOT DETECTED Final   Respiratory Syncytial Virus NOT DETECTED NOT DETECTED Final   Bordetella pertussis NOT DETECTED NOT DETECTED Final   Bordetella  Parapertussis NOT DETECTED NOT DETECTED Final   Chlamydophila pneumoniae NOT DETECTED NOT DETECTED Final   Mycoplasma pneumoniae NOT DETECTED NOT DETECTED Final    Comment: Performed at [facility], [address] [place], [place] [address]  Culture, blood (Routine X 2) w Reflex to ID Panel     Status: None (Preliminary result)   Collection Time: 05/02/23 10:05 PM   Specimen: BLOOD  Result Value Ref Range Status   Specimen Description BLOOD RIGHT WRIST  Final   Special Requests   Final    BOTTLES DRAWN AEROBIC AND ANAEROBIC Blood Culture adequate volume   Culture   Final    NO GROWTH 3 DAYS Performed at [facility], [address], [place], [place] [address]    Report Status PENDING  Incomplete  Culture, blood (Routine X 2) w Reflex to ID Panel     Status: None (Preliminary result)   Collection Time: 05/02/23 10:05 PM   Specimen: BLOOD  Result Value Ref Range Status   Specimen Description BLOOD RIGHT HAND  Final   Special Requests   Final    BOTTLES DRAWN AEROBIC ONLY Blood Culture adequate volume   Culture   Final    NO GROWTH 3 DAYS Performed at [facility], [address] [place], [place] [address]    Report Status PENDING  Incomplete    Coagulation Studies: Recent Labs    05/02/23 1516 05/03/23 0532  LABPROT 17.6* 17.2*  INR 1.4* 1.4*    Urinalysis: Recent Labs    05/02/23 2217  COLORURINE AMBER*  LABSPEC 1.020  PHURINE 5.0  GLUCOSEU NEGATIVE  HGBUR NEGATIVE  BILIRUBINUR NEGATIVE  KETONESUR NEGATIVE  PROTEINUR >=300*  NITRITE NEGATIVE  LEUKOCYTESUR NEGATIVE      Imaging: DG Chest Port 1 View  Result Date: 05/04/2023 CLINICAL DATA:  Pneumonia.  Follow-up study. EXAM: PORTABLE CHEST 1 VIEW COMPARISON:  05/02/2023 and older exams. FINDINGS: Cardiac silhouette borderline enlarged. No mediastinal hilar masses. Mild hazy opacity at the lung bases suspected to be atelectasis, with possible small effusions. Remainder of the lungs  is clear. No pneumothorax. IMPRESSION: 1. Mild bibasilar opacity suspected to be atelectasis with possible small effusions. 2.  No evidence of pneumonia. Electronically Signed   By: Amie Portland M.D.   On: 05/04/2023 14:25   ECHOCARDIOGRAM COMPLETE  Result Date: 05/03/2023    ECHOCARDIOGRAM REPORT   Patient Name:   [name] Date of Exam: 05/03/2023 Medical Rec #:  [id number]         Height:       70.0 in Accession #:    [id number]        Weight:       191.1 lb Date of Birth:  September 15, 1940         BSA:          2.048 m Patient Age:    82 years          BP:           106/63 mmHg Patient Gender: M                 HR:           85 bpm. Exam Location:  [facility] Procedure: 2D Echo, Cardiac Doppler and Color Doppler Indications:     Chest Pain  History:         Patient has prior history of Echocardiogram examinations, most                  recent 11/20/2007. Signs/Symptoms:Chest Pain and                  Dizziness/Lightheadedness; Risk Factors:Hypertension and                  Dyslipidemia. CKD, Acute Pericarditus.  Sonographer:     Mikki Harbor Referring Phys:  [id number] Iba Diagnosing Phys: Julien Nordmann MD IMPRESSIONS  1. Left ventricular ejection fraction, by estimation, is 40 to 45%. The left ventricle has mildly decreased function. The left ventricle demonstrates global hypokinesis. There is mild left ventricular hypertrophy. Left ventricular diastolic parameters are indeterminate.  2. Right ventricular systolic function is mildly reduced. The right ventricular size is normal. There is mildly elevated pulmonary artery systolic pressure. The estimated right ventricular systolic pressure is 41.8 mmHg.  3. Left atrial size was moderately dilated.  4. The mitral valve is normal in structure. Mild mitral valve regurgitation. No evidence of mitral stenosis.  5. Tricuspid valve regurgitation is mild to moderate.  6. The aortic valve is normal in structure. Aortic valve regurgitation is not visualized. No  aortic stenosis is present.  7. The inferior vena cava is dilated in size with <50% respiratory variability, suggesting right atrial pressure of 15 mmHg. FINDINGS  Left Ventricle: Left ventricular ejection fraction, by estimation, is 40 to 45%. The left ventricle has mildly decreased function. The left ventricle demonstrates global hypokinesis. The left ventricular internal cavity size was normal in size. There is  mild left ventricular hypertrophy. Left ventricular diastolic parameters are indeterminate. Right Ventricle: The right ventricular size is normal. No increase in right ventricular wall thickness. Right ventricular systolic function is mildly reduced. There is mildly elevated pulmonary artery systolic pressure. The tricuspid regurgitant velocity  is 2.59 m/s, and with an assumed right atrial pressure of 15 mmHg, the estimated right ventricular systolic pressure is 41.8 mmHg. Left Atrium: Left atrial size was moderately dilated. Right Atrium: Right atrial size was normal in size. Pericardium: There is no evidence of pericardial effusion. Mitral Valve: The mitral valve is normal in structure. Mild mitral valve regurgitation. No evidence of mitral valve stenosis. MV peak gradient, 7.8 mmHg. The  mean mitral valve gradient is 2.0 mmHg. Tricuspid Valve: The tricuspid valve is normal in structure. Tricuspid valve regurgitation is mild to moderate. No evidence of tricuspid stenosis. Aortic Valve: The aortic valve is normal in structure. Aortic valve regurgitation is not visualized. No aortic stenosis is present. Aortic valve mean gradient measures 5.7 mmHg. Aortic valve peak gradient measures 9.8 mmHg. Aortic valve area, by VTI measures 2.11 cm. Pulmonic Valve: The pulmonic valve was normal in structure. Pulmonic valve regurgitation is not visualized. No evidence of pulmonic stenosis. Aorta: The aortic root is normal in size and structure. Venous: The inferior vena cava is dilated in size with less than 50%  respiratory variability, suggesting right atrial pressure of 15 mmHg. IAS/Shunts: No atrial level shunt detected by color flow Doppler.  LEFT VENTRICLE PLAX 2D LVIDd:         4.50 cm   Diastology LVIDs:         3.30 cm   LV e' medial:   124.00 cm/s LV PW:         1.40 cm   LV E/e' medial: 1.0 LV IVS:        1.30 cm LVOT diam:     2.00 cm LV SV:         62 LV SV Index:   30 LVOT Area:     3.14 cm  RIGHT VENTRICLE RV Basal diam:  4.40 cm RV Mid diam:    4.10 cm LEFT ATRIUM              Index        RIGHT ATRIUM           Index LA diam:        4.00 cm  1.95 cm/m   RA Area:     29.10 cm LA Vol (A2C):   143.0 ml 69.84 ml/m  RA Volume:   103.00 ml 50.31 ml/m LA Vol (A4C):   117.0 ml 57.14 ml/m LA Biplane Vol: 132.0 ml 64.47 ml/m  AORTIC VALVE                     PULMONIC VALVE AV Area (Vmax):    2.42 cm      PV Vmax:       1.02 m/s AV Area (Vmean):   1.88 cm      PV Peak grad:  4.2 mmHg AV Area (VTI):     2.11 cm AV Vmax:           156.33 cm/s AV Vmean:          114.000 cm/s AV VTI:            0.295 m AV Peak Grad:      9.8 mmHg AV Mean Grad:      5.7 mmHg LVOT Vmax:         120.50 cm/s LVOT Vmean:        68.400 cm/s LVOT VTI:          0.198 m LVOT/AV VTI ratio: 0.67  AORTA Ao Root diam: 3.90 cm MITRAL VALVE                TRICUSPID VALVE MV Area (PHT): 3.48 cm     TR Peak grad:   26.8 mmHg MV Area VTI:   2.25 cm     TR Vmax:        259.00 cm/s MV Peak grad:  7.8 mmHg MV Mean grad:  2.0 mmHg  SHUNTS MV Vmax:       1.40 m/s     Systemic VTI:  0.20 m MV Vmean:      62.9 cm/s    Systemic Diam: 2.00 cm MV Decel Time: 218 msec MV E velocity: 123.00 cm/s Julien Nordmann MD Electronically signed by Julien Nordmann MD Signature Date/Time: 05/03/2023/5:23:19 PM    Final (Updated)      Medications:    heparin 1,100 Units/hr (05/05/23 0925)   sodium bicarbonate 150 mEq in dextrose 5 % 1,150 mL infusion 75 mL/hr at 05/05/23 0800    sodium chloride   Intravenous Once   Chlorhexidine Gluconate Cloth  6 each  Topical Daily   feeding supplement  237 mL Oral TID BM   levothyroxine  25 mcg Oral Q0600   lidocaine  1 patch Transdermal Q24H   metoprolol succinate  12.5 mg Oral Daily   multivitamin  1 tablet Oral QHS   pantoprazole  40 mg Oral Daily   polyethylene glycol  17 g Oral Daily   predniSONE  20 mg Oral Q breakfast   simvastatin  40 mg Oral Daily   sodium chloride flush  3 mL Intravenous Q12H   acetaminophen, bisacodyl, docusate sodium, melatonin, ondansetron (ZOFRAN) IV, mouth rinse, polyethylene glycol, sodium chloride flush  Assessment/ Plan:  82 y.o. 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  Baseline creatinine of 2.5/GFR 25 on April 21, 2023 AKI is likely multifactorial.  Urinalysis from May 02, 2023 shows proteinuria, negative for blood.  0-5 RBCs.  0-5 WBCs.  Low likelihood for acute glomerulonephritis or acute interstitial nephritis. He did present with hypotension which may have led to ATN causing AKI which was subsequently exacerbated by IV contrast exposure from cardiac catheterization. Urine output of about 750 cc yesterday. His electrolytes and volume status are acceptable at present although tenuous. No acute indication for dialysis at present but will follow closely on a daily basis. -Maintain hemodynamic stability and avoid hypotension. -Avoid IV contrast, nonsteroidals. -Agree to hold hydralazine, Marcelline Deist. - urine output appears to be improving.  Hopefully we will see renal recovery soon.  #Acute pericarditis ANA, complements negative.  Low likelihood of autoimmune disease. Other differential includes viral myocarditis versus other  causes. Currently getting treatment with steroids.   Low likelihood of uremic pericarditis in the setting of relatively recent onset of AKI  #Hyperkalemia Lokelma can be given as needed.  #Proteinuria Patient is noted to have proteinuria even as outpatient. Previous autoimmune workup in June 2024 was negative. Urine protein to creatinine ratio 4.7 g In October 2024. He has known numerous simple and minimally complex renal cysts bilaterally.  CT abdomen from May 02, 2023 is negative for ureteral calculi, hydronephrosis.    LOS: 3 Brighton Pilley 11/29/20242:51 PM  [facility] [address] [place], [place] [phone]  Note: This note was prepared with Dragon dictation. Any transcription errors are unintentional

## 2023-05-05 NOTE — Consult Note (Signed)
PHARMACY - ANTICOAGULATION CONSULT NOTE  Pharmacy Consult for Heparin Indication: atrial fibrillation  Allergies  Allergen Reactions   Meloxicam Nausea And Vomiting   Patient Measurements: Height: 5\' 10"  (177.8 cm) Weight: 87.8 kg (193 lb 9 oz) IBW/kg (Calculated) : 73 Heparin Dosing Weight: 81.6 kg  Vital Signs: Temp: 98.1 F (36.7 C) (11/29 1345) Temp Source: Oral (11/29 1345) BP: 134/85 (11/29 1600) Pulse Rate: 98 (11/29 1600)  Labs: Recent Labs    05/03/23 0532 05/03/23 1115 05/04/23 0333 05/04/23 1147 05/04/23 1559 05/05/23 0432 05/05/23 1713  HGB 7.8*   < > 7.5* 7.7*  --  7.7*  --   HCT 24.9*   < > 23.7* 24.7*  --  23.7*  --   PLT 342  --  271  --   --  248  --   LABPROT 17.2*  --   --   --   --   --   --   INR 1.4*  --   --   --   --   --   --   HEPARINUNFRC  --   --   --   --   --   --  <0.10*  CREATININE 4.91*   < > 5.07*  --  5.06* 5.01*  --    < > = values in this interval not displayed.    Estimated Creatinine Clearance: 12.7 mL/min (A) (by C-G formula based on SCr of 5.01 mg/dL (H)).  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 They have a past medical history significant for  HFpEF, HTN, HDL and CKD stage IIIb. EKG showed ST elevation. Cath showed mild nonobstructive CAD. Patient was newly diagnosed with atrial fibrillation with rapid ventricular response. CHA2DS2-VASc score 56 (HTN, HF, age, HTN).    Date Time HL Rate/Comment 11/29 1713 <0.10 SUBtherapeutic  No issues with infusion reported, no signs/symptoms of bleeding noted.  Goal of Therapy:  Heparin level 0.3-0.7 units/ml Monitor platelets by anticoagulation protocol: Yes   Plan:  Give heparin bolus of 2400 units x1 Increase heparin infusion to 1350 units/hour Check anti-Xa level in 8 hours and daily while on heparin Continue to monitor H&H and platelets  Thank you for involving pharmacy in this patient's care.   [name], PharmD Clinical Pharmacist 05/05/2023 6:27 PM

## 2023-05-05 NOTE — Progress Notes (Signed)
Physical Therapy Treatment Patient Details Name: [name] MRN: [id number] DOB: 1941/02/11 Today's Date: 05/05/2023   History of Present Illness [name] is a 82 y.o. male with medical history significant of chronic HFpEF, hypertension, hyperlipidemia, myelofibrosis, CKD stage IIIb, anemia of chronic disease, who presented initially to [facility] ED as a STEMI alert.    PT Comments  Pt with R UE falling out of sling on arrival, adjusted and educated on appropriate positioning as well as the reasoning for this.  Also gave extensive education on total shoulder implications, limitations and progression which neither pt nor son seemed to have a good grasp of.  Pt able to tolerate gentle ROM to R shoulder > 90 in scaption and flexion planes, continued to encourage distal UE movement especially ball squeezes as his R hand is weak and numb.  Deferred prolonged ambulation, etc due to medical concerns (blood transfusion, etc) but pt in a much better spot with post shoulder awareness. Pt will benefit from continued PT to address functional limitations and insure appropriate total shoulder recovery.     If plan is discharge home, recommend the following: A lot of help with walking and/or transfers;A lot of help with bathing/dressing/bathroom;Assistance with cooking/housework;Assist for transportation;Help with stairs or ramp for entrance   Can travel by private vehicle        Equipment Recommendations   (TBD)    Recommendations for Other Services       Precautions / Restrictions Precautions Precautions: Fall;Shoulder Shoulder Interventions: Shoulder sling/immobilizer;Shoulder abduction pillow;At all times;Off for dressing/bathing/exercises Restrictions RUE Weight Bearing: Non weight bearing     Mobility  Bed Mobility Overal bed mobility: Needs Assistance Bed Mobility: Sit to Supine       Sit to supine: Contact guard assist   General bed mobility comments: able to get himself rotated  around in bed w/o use of R UE and w/o assist from PT    Transfers Overall transfer level: Needs assistance Equipment used: 1 person hand held assist Transfers: Sit to/from Stand Sit to Stand: Contact guard assist Stand pivot transfers: Contact guard assist (single HHA)         General transfer comment: Pt able to rise to standing w/o hesitations using just L UE.  Single    Ambulation/Gait               General Gait Details: deferred (blood transfusion)   Stairs             Wheelchair Mobility     Tilt Bed    Modified Rankin (Stroke Patients Only)       Balance Overall balance assessment: Needs assistance Sitting-balance support: No upper extremity supported, Feet supported Sitting balance-Leahy Scale: Normal     Standing balance support: Single extremity supported, During functional activity Standing balance-Leahy Scale: Fair Standing balance comment: Able to maintain standing balance with use of single HHA (L)                            Cognition Arousal: Alert Behavior During Therapy: WFL for tasks assessed/performed Overall Cognitive Status: Within Functional Limits for tasks assessed                                 General Comments: poor insight into shoulder surgery recovery        Exercises Other Exercises Other Exercises: PROM R shoulder in flexion,  scaption and abduction X 5 each with gentle pulsed endrange (~120 to 90 respectively).  Extensive education on distal UE use - pt with weakness/numbness in R hand    General Comments General comments (skin integrity, edema, etc.): Spent a lot of time educating on typical post total shoulder limitation, protection and expected progression.  Pt with much improved awareness after discussion.      Pertinent Vitals/Pain Pain Assessment Pain Assessment: No/denies pain    Home Living                          Prior Function            PT Goals (current  goals can now be found in the care plan section) Progress towards PT goals: Progressing toward goals    Frequency    Min 1X/week      PT Plan      Co-evaluation              AM-PAC PT "6 Clicks" Mobility   Outcome Measure  Help needed turning from your back to your side while in a flat bed without using bedrails?: A Little Help needed moving from lying on your back to sitting on the side of a flat bed without using bedrails?: A Little Help needed moving to and from a bed to a chair (including a wheelchair)?: A Little Help needed standing up from a chair using your arms (e.g., wheelchair or bedside chair)?: A Little Help needed to walk in hospital room?: A Lot Help needed climbing 3-5 steps with a railing? : A Lot 6 Click Score: 16    End of Session Equipment Utilized During Treatment: Oxygen Activity Tolerance: Patient tolerated treatment well Patient left: in bed;with call bell/phone within reach;with family/visitor present Nurse Communication: Mobility status PT Visit Diagnosis: Unsteadiness on feet (R26.81);History of falling (Z91.81);Repeated falls (R29.6);Muscle weakness (generalized) (M62.81);Pain Pain - Right/Left: Right Pain - part of body: Shoulder     Time: 1425-1500 PT Time Calculation (min) (ACUTE ONLY): 35 min  Charges:    $Therapeutic Exercise: 8-22 mins $Therapeutic Activity: 8-22 mins PT General Charges $$ ACUTE PT VISIT: 1 Visit                     Malachi Pro, DPT 05/05/2023, 5:09 PM

## 2023-05-05 NOTE — Progress Notes (Signed)
Patient Name: [name] Date of Encounter: 05/05/2023 [facility] Cardiologist: Mariah Milling  Interval Summary  .    No chest pain, dyspnea, or palpitations. No hematochezia or melena. BP improved into the 120s mmHg systolic. Remains in Afib.   Vital Signs .    Vitals:   05/05/23 0457 05/05/23 0500 05/05/23 0700 05/05/23 0800  BP:  (!) 123/57 120/73 116/71  Pulse:  99 99 88  Resp:  18 18 18   Temp:    98.2 F (36.8 C)  TempSrc:    Oral  SpO2:  96% 96% 97%  Weight: 55.4 kg 87.8 kg    Height:        Intake/Output Summary (Last 24 hours) at 05/05/2023 0950 Last data filed at 05/05/2023 0800 Gross per 24 hour  Intake 2215.91 ml  Output 700 ml  Net 1515.91 ml      05/05/2023    5:00 AM 05/05/2023    4:57 AM 05/04/2023    5:00 AM  Last 3 Weights  Weight (lbs) 193 lb 9 oz 122 lb 2.2 oz 196 lb 6.9 oz  Weight (kg) 87.8 kg 55.4 kg 89.1 kg      Telemetry/ECG    Atrial fibrillation with heart rates predominantly 90s to low 100s bpm - Personally Reviewed  Physical Exam .   GEN: No acute distress.  Family is at the bedside. Neck: No JVD Cardiac: Irregularly irregular without murmurs, rubs, or gallops. Respiratory: Mildly diminished breath sounds throughout without wheezes or crackles. GI: Soft, nontender, non-distended  MS: No right arm swelling.  No lower extremity edema.  Assessment & Plan .     Acute pericarditis: Chest and abdominal pain have resolved with addition of prednisone.  EKG 11/28 showed persistent widespread ST elevations, though less pronounced than on prior tracings upon presentation.  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  ESR and CRP noted to be markedly elevated on admission.  Etiology for pericarditis remains uncertain.  Considerations include idiopathic, drug-induced with recent initiation of hydralazine, or uremic, though nephrology thinks relatively sudden onset of AKI would be less likely to cause acute  pericarditis.  ANA negative. -Continue prednisone 20 mg daily. -Colchicine 0.3 mg given x 1 on 11/26.  Would not redose at this time in the setting of acute kidney injury superimposed on chronic kidney disease. -Unable to use NSAIDs with renal dysfunction.  Acute on chronic anemia with myelofibrosis: -Hemoglobin relatively stable but still quite low. -Consider PRBC transfusion for target hemoglobin greater than 8. -Monitor on heparin gtt.   Atrial fibrillation with rapid ventricular response: New diagnosis for the patient.  May have been precipitated by acute pericarditis.  Patient is high risk for cardioembolic events in the setting of his acute inflammatory process and other comorbidities with a CHA2DS2-VASc score of at least 6.  He is also at high risk for bleeding complications.  Has been started on heparin gtt while admitted, tolerating at this point. -Uncertain if he would be a long-term OAC candidate, consider Watchman evaluation in the outpatient setting.  -Add Toprol XL 12.5 mg daily as below.   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  EF may be somewhat underestimated in the setting of atrial fibrillation.  Mr. Kabel appears fairly euvolemic. -Maintain net even fluid balance. -Consider PRBC transfusion, as above.  Gentle diuresis may be necessary with PRBC transfusion to minimize risk for transfusion associated circulatory overload (TACO). -Add Toprol XL 12.5 mg daily.  -  Relative hypotension and acute on CKD preclude escalation of GDMT at this time.   Acute kidney injury superimposed on chronic kidney disease: -Creatinine seems to have plateaued around 5 (baseline has been 2-2.5, noted to be 4.5 on admission).  An element of CIN could be contributing to further rise in creatinine, though only 25 mL of contrast were used for diagnostic catheterization.  Patient remains somewhat oliguric, having made 500 mL of urine  yesterday. -Continue supportive care.  Appreciate nephrology recommendations. -Target net even fluid balance.  As above, patient may benefit from PRBC transfusion.  If he develops further signs of volume overload, judicious diuresis will need to be considered to minimize risk for TACO.   For questions or updates, please contact [facility] Please consult [email or web address] for contact info under [facility].    Signed, Eula Listen, PA-C

## 2023-05-05 NOTE — Consult Note (Signed)
PHARMACY - ANTICOAGULATION CONSULT NOTE  Pharmacy Consult for Heparin Indication: atrial fibrillation  Allergies  Allergen Reactions   Meloxicam Nausea And Vomiting   Patient Measurements: Height: 5\' 10"  (177.8 cm) Weight: 87.8 kg (193 lb 9 oz) IBW/kg (Calculated) : 73 Heparin Dosing Weight: 81.6 kg  Vital Signs: Temp: 98 F (36.7 C) (11/29 0000) Temp Source: Oral (11/29 0000) BP: 123/57 (11/29 0500) Pulse Rate: 99 (11/29 0500)  Labs: Recent Labs    05/02/23 1516 05/02/23 1725 05/02/23 2205 05/03/23 0532 05/03/23 1115 05/04/23 0333 05/04/23 1147 05/04/23 1559 05/05/23 0432  HGB 7.0*  --    < > 7.8*   < > 7.5* 7.7*  --  7.7*  HCT 22.8*  --    < > 24.9*   < > 23.7* 24.7*  --  23.7*  PLT 363  --    < > 342  --  271  --   --  248  APTT 46*  --   --   --   --   --   --   --   --   LABPROT 17.6*  --   --  17.2*  --   --   --   --   --   INR 1.4*  --   --  1.4*  --   --   --   --   --   CREATININE 4.52*  --    < > 4.91*   < > 5.07*  --  5.06* 5.01*  TROPONINIHS 32* 41*  --   --   --   --   --   --   --    < > = values in this interval not displayed.    Estimated Creatinine Clearance: 12.7 mL/min (A) (by C-G formula based on SCr of 5.01 mg/dL (H)).  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 They have a past medical history significant for HFpEF, HTN, HDL and CKD stage IIIb. EKG showed ST elevation. Cath showed mild nonobstructive CAD. Patient was newly diagnosed with atrial fibrillation with rapid ventricular response. CHA2DS2-VASc score 56 (HTN, HF, age, HTN).    Goal of Therapy:  Heparin level 0.3-0.7 units/ml Monitor platelets by anticoagulation protocol: Yes   Plan:  No bolus since they received heparin DVT ppx dose this AM. Start heparin infusion at 1,100 units/hr Check anti-Xa level in 8 hours and daily while on heparin Continue to monitor H&H and platelets  Effie Shy, PharmD Pharmacy Resident  05/05/2023 7:20 AM

## 2023-05-05 NOTE — Progress Notes (Signed)
Occupational Therapy Treatment Patient Details Name: [name] MRN: [id number] DOB: June 01, 1941 Today's Date: 05/05/2023   History of present illness [name] is a 82 y.o. male with medical history significant of chronic HFpEF, hypertension, hyperlipidemia, myelofibrosis, CKD stage IIIb, anemia of chronic disease, who presented initially to [facility] ED as a STEMI alert.   OT comments  Mr [name] was seen for OT treatment on this date. Upon arrival to room pt in bed, found to have sling doffed with abduction pillow not in room, pt agreeable to tx. Pt reports he has not been wearing sling and moves arm without restriction - re-educated on NWBing pcns and importance of sling for recovery. Pt denies pain t/o session, reports he has not been using his squeeze ball because he cannot achieve a full fist in R hand.   Pt requires no assist to exit bed. CGA + HHA for ADL t/f ~10 ft. MAX A don sling and B socks at bed level. SpO2 95% on RA at rest, desat 86% with mobility, resolved with seated rest. Reviewed HEP. Pt making good progress toward goals, will continue to follow POC. Discharge recommendation remains appropriate.        If plan is discharge home, recommend the following:  A little help with walking and/or transfers;A lot of help with bathing/dressing/bathroom;Assistance with cooking/housework;Assist for transportation;Help with stairs or ramp for entrance;Direct supervision/assist for medications management   Equipment Recommendations  Other (comment) (defer)    Recommendations for Other Services      Precautions / Restrictions Precautions Precautions: Fall;Shoulder Shoulder Interventions: Shoulder sling/immobilizer;Shoulder abduction pillow;At all times;Off for dressing/bathing/exercises Restrictions Weight Bearing Restrictions: Yes RUE Weight Bearing: Non weight bearing       Mobility Bed Mobility Overal bed mobility: Needs Assistance Bed Mobility: Supine to Sit     Supine  to sit: Contact guard          Transfers Overall transfer level: Needs assistance Equipment used: 1 person hand held assist Transfers: Sit to/from Stand Sit to Stand: Contact guard assist                 Balance Overall balance assessment: Needs assistance Sitting-balance support: No upper extremity supported, Feet supported Sitting balance-Leahy Scale: Normal     Standing balance support: Single extremity supported, During functional activity Standing balance-Leahy Scale: Fair                             ADL either performed or assessed with clinical judgement   ADL Overall ADL's : Needs assistance/impaired                                       General ADL Comments: CGA + HHA for simulated toilet t/f. MAX A don sling and B socks in bed      Cognition Arousal: Alert Behavior During Therapy: WFL for tasks assessed/performed Overall Cognitive Status: Within Functional Limits for tasks assessed                                 General Comments: poor insight into shoulder surgery recovery                   Pertinent Vitals/ Pain       Pain Assessment Pain Assessment: No/denies pain  Frequency  Min 1X/week        Progress Toward Goals  OT Goals(current goals can now be found in the care plan section)  Progress towards OT goals: Progressing toward goals  Acute Rehab OT Goals Patient Stated Goal: to go home OT Goal Formulation: With patient/family Time For Goal Achievement: 05/17/23 Potential to Achieve Goals: Good ADL Goals Pt Will Perform Grooming: with supervision;sitting Pt/caregiver will Perform Home Exercise Program: Right Upper extremity;Increased ROM;Increased strength  Plan      Co-evaluation                 AM-PAC OT "6 Clicks" Daily Activity     Outcome Measure   Help from another person eating meals?: A Little Help from another person taking care of personal grooming?: A  Little Help from another person toileting, which includes using toliet, bedpan, or urinal?: A Little Help from another person bathing (including washing, rinsing, drying)?: A Lot Help from another person to put on and taking off regular upper body clothing?: A Lot Help from another person to put on and taking off regular lower body clothing?: A Lot 6 Click Score: 15    End of Session    OT Visit Diagnosis: Other abnormalities of gait and mobility (R26.89);Repeated falls (R29.6);Muscle weakness (generalized) (M62.81)   Activity Tolerance Patient tolerated treatment well   Patient Left in chair;with call bell/phone within reach;with family/visitor present   Nurse Communication          Time: 7829-5621 OT Time Calculation (min): 24 min  Charges: OT General Charges $OT Visit: 1 Visit OT Treatments $Self Care/Home Management : 8-22 mins $Therapeutic Exercise: 8-22 mins  Kathie Dike, M.S. OTR/L  05/05/23, 12:49 PM  ascom [phone]

## 2023-05-06 ENCOUNTER — Other Ambulatory Visit: Payer: Self-pay

## 2023-05-06 DIAGNOSIS — N189 Chronic kidney disease, unspecified: Secondary | ICD-10-CM

## 2023-05-06 DIAGNOSIS — I300 Acute nonspecific idiopathic pericarditis: Secondary | ICD-10-CM | POA: Diagnosis not present

## 2023-05-06 DIAGNOSIS — N179 Acute kidney failure, unspecified: Secondary | ICD-10-CM | POA: Diagnosis not present

## 2023-05-06 DIAGNOSIS — I309 Acute pericarditis, unspecified: Secondary | ICD-10-CM | POA: Diagnosis not present

## 2023-05-06 DIAGNOSIS — I4891 Unspecified atrial fibrillation: Secondary | ICD-10-CM

## 2023-05-06 DIAGNOSIS — I5021 Acute systolic (congestive) heart failure: Secondary | ICD-10-CM | POA: Diagnosis not present

## 2023-05-06 LAB — BASIC METABOLIC PANEL
Anion gap: 14 (ref 5–15)
BUN: 95 mg/dL — ABNORMAL HIGH (ref 8–23)
CO2: 21 mmol/L — ABNORMAL LOW (ref 22–32)
Calcium: 6.7 mg/dL — ABNORMAL LOW (ref 8.9–10.3)
Chloride: 96 mmol/L — ABNORMAL LOW (ref 98–111)
Creatinine, Ser: 4.37 mg/dL — ABNORMAL HIGH (ref 0.61–1.24)
GFR, Estimated: 13 mL/min — ABNORMAL LOW (ref >60)
Glucose, Bld: 138 mg/dL — ABNORMAL HIGH (ref 70–99)
Potassium: 4.6 mmol/L (ref 3.5–5.1)
Sodium: 131 mmol/L — ABNORMAL LOW (ref 135–145)

## 2023-05-06 LAB — TYPE AND SCREEN
ABO/RH(D): O POS
Antibody Screen: NEGATIVE
Unit division: 0
Unit division: 0
Unit division: 0

## 2023-05-06 LAB — BPAM RBC
Blood Product Expiration Date: 202412012359
Blood Product Expiration Date: 202412272359
Blood Product Expiration Date: 202412272359
ISSUE DATE / TIME: 202411261904
ISSUE DATE / TIME: 202411262305
ISSUE DATE / TIME: 202411291311
Unit Type and Rh: 5100
Unit Type and Rh: 5100
Unit Type and Rh: 9500

## 2023-05-06 LAB — MAGNESIUM: Magnesium: 2.1 mg/dL (ref 1.7–2.4)

## 2023-05-06 LAB — CBC
HCT: 27.5 % — ABNORMAL LOW (ref 39.0–52.0)
Hemoglobin: 9 g/dL — ABNORMAL LOW (ref 13.0–17.0)
MCH: 29.1 pg (ref 26.0–34.0)
MCHC: 32.7 g/dL (ref 30.0–36.0)
MCV: 89 fL (ref 80.0–100.0)
Platelets: 286 10*3/uL (ref 150–400)
RBC: 3.09 MIL/uL — ABNORMAL LOW (ref 4.22–5.81)
RDW: 18.6 % — ABNORMAL HIGH (ref 11.5–15.5)
WBC: 8.6 10*3/uL (ref 4.0–10.5)
nRBC: 0.5 % — ABNORMAL HIGH (ref 0.0–0.2)

## 2023-05-06 LAB — HEPARIN LEVEL (UNFRACTIONATED)
Heparin Unfractionated: 0.1 [IU]/mL — ABNORMAL LOW (ref 0.30–0.70)
Heparin Unfractionated: 0.2 [IU]/mL — ABNORMAL LOW (ref 0.30–0.70)
Heparin Unfractionated: 0.23 [IU]/mL — ABNORMAL LOW (ref 0.30–0.70)

## 2023-05-06 LAB — GLUCOSE, CAPILLARY
Glucose-Capillary: 94 mg/dL (ref 70–99)
Glucose-Capillary: 119 mg/dL — ABNORMAL HIGH (ref 70–99)

## 2023-05-06 MED ORDER — METOPROLOL SUCCINATE ER 50 MG PO TB24
50.0000 mg | ORAL_TABLET | Freq: Every day | ORAL | Status: DC
  Administered 2023-05-07: 50 mg via ORAL
  Filled 2023-05-06: qty 1

## 2023-05-06 MED ORDER — INFLUENZA VAC A&B SURF ANT ADJ 0.5 ML IM SUSY
0.5000 mL | PREFILLED_SYRINGE | INTRAMUSCULAR | Status: AC
  Administered 2023-05-07: 0.5 mL via INTRAMUSCULAR
  Filled 2023-05-06 (×2): qty 0.5

## 2023-05-06 MED ORDER — HEPARIN BOLUS VIA INFUSION
1200.0000 [IU] | Freq: Once | INTRAVENOUS | Status: AC
  Administered 2023-05-06: 1200 [IU] via INTRAVENOUS
  Filled 2023-05-06: qty 1200

## 2023-05-06 MED ORDER — HEPARIN BOLUS VIA INFUSION
2400.0000 [IU] | Freq: Once | INTRAVENOUS | Status: AC
  Administered 2023-05-06: 2400 [IU] via INTRAVENOUS
  Filled 2023-05-06: qty 2400

## 2023-05-06 MED ORDER — METOPROLOL SUCCINATE ER 50 MG PO TB24: 25.0000 mg | ORAL_TABLET | Freq: Every day | ORAL | Status: DC

## 2023-05-06 MED ORDER — METOPROLOL SUCCINATE ER 50 MG PO TB24
25.0000 mg | ORAL_TABLET | Freq: Once | ORAL | Status: AC
  Administered 2023-05-06: 25 mg via ORAL
  Filled 2023-05-06: qty 1

## 2023-05-06 NOTE — Consult Note (Signed)
PHARMACY - ANTICOAGULATION CONSULT NOTE  Pharmacy Consult for Heparin Indication: atrial fibrillation  Allergies  Allergen Reactions   Meloxicam Nausea And Vomiting   Patient Measurements: Height: 5\' 10"  (177.8 cm) Weight: 87.8 kg (193 lb 9 oz) IBW/kg (Calculated) : 73 Heparin Dosing Weight: 81.6 kg  Vital Signs: Temp: 98.3 F (36.8 C) (11/30 0000) BP: 131/79 (11/30 0000) Pulse Rate: 112 (11/30 0000)  Labs: Recent Labs    05/03/23 0532 05/03/23 1115 05/04/23 0333 05/04/23 1147 05/04/23 1559 05/05/23 0432 05/05/23 1713 05/06/23 0259  HGB 7.8*   < > 7.5* 7.7*  --  7.7*  --  9.0*  HCT 24.9*   < > 23.7* 24.7*  --  23.7*  --  27.5*  PLT 342  --  271  --   --  248  --  286  LABPROT 17.2*  --   --   --   --   --   --   --   INR 1.4*  --   --   --   --   --   --   --   HEPARINUNFRC  --   --   --   --   --   --  <0.10* 0.10*  CREATININE 4.91*   < > 5.07*  --  5.06* 5.01*  --  4.37*   < > = values in this interval not displayed.    Estimated Creatinine Clearance: 14.5 mL/min (A) (by C-G formula based on SCr of 4.37 mg/dL (H)).  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 They have a past medical  history significant for HFpEF, HTN, HDL and CKD stage IIIb. EKG showed ST elevation. Cath showed mild nonobstructive CAD. Patient was newly diagnosed with atrial fibrillation with rapid ventricular response. CHA2DS2-VASc score 56 (HTN, HF, age, HTN).    Date Time HL Rate/Comment 11/29 1713 <0.10 SUBtherapeutic 11/30 0259 0.10 Subtherapeutic  No issues with infusion reported, no signs/symptoms of bleeding noted.  Goal of Therapy:  Heparin level 0.3-0.7 units/ml Monitor platelets by anticoagulation protocol: Yes   Plan:  Give heparin bolus of 2400 units x1 Increase heparin infusion to 1600 units/hour Check anti-Xa level in 8 hours and daily while on heparin Continue to monitor H&H and platelets  Thank you for involving pharmacy in this patient's care.   Otelia Sergeant, PharmD, [facility] 05/06/2023 3:45 AM

## 2023-05-06 NOTE — Plan of Care (Signed)

## 2023-05-06 NOTE — TOC Initial Note (Signed)
Transition of Care [facility]) - Initial/Assessment Note    Patient Details  Name: [name] MRN: [id number] Date of Birth: 1940/10/29  Transition of Care [facility]) CM/SW Contact:    Colette Ribas, LCSWA Phone Number: 05/06/2023, 11:10 AM  Clinical Narrative:                  CSW Spoke with patient's spouse regarding SNF reccs, they are agreeable and advised [facility] there preferred places, advised I will send referral there and other places and we will f/u on next steps. CSW completed SNF workup and sent out referrals.   Expected Discharge Plan: Home/Self Care Barriers to Discharge: Continued Medical Work up   Patient Goals and CMS Choice Patient states their goals for this hospitalization and ongoing recovery are:: Home          Expected Discharge Plan and Services                                              Prior Living Arrangements/Services   Lives with:: Self, Spouse Patient language and need for interpreter reviewed:: Yes Do you feel safe going back to the place where you live?: Yes      Need for Family Participation in Patient Care: Yes (Comment) Care giver support system in place?: Yes (comment)   Criminal Activity/Legal Involvement Pertinent to Current Situation/Hospitalization: No - Comment as needed  Activities of Daily Living   ADL Screening (condition at time of admission) Independently performs ADLs?: No Does the patient have a NEW difficulty with bathing/dressing/toileting/self-feeding that is expected to last >3 days?: Yes (Initiates electronic notice to provider for possible OT consult) Does the patient have a NEW difficulty with getting in/out of bed, walking, or climbing stairs that is expected to last >3 days?: Yes (Initiates electronic notice to provider for possible PT consult) Does the patient have a NEW difficulty with communication that is expected to last >3 days?: No Is the patient deaf or have difficulty hearing?:  Yes Does the patient have difficulty seeing, even when wearing glasses/contacts?: No Does the patient have difficulty concentrating, remembering, or making decisions?: No  Permission Sought/Granted                  Emotional Assessment Appearance:: Appears stated age Attitude/Demeanor/Rapport: Engaged, Gracious Affect (typically observed): Accepting Orientation: : Oriented to Self, Oriented to Place, Oriented to  Time, Oriented to Situation Alcohol / Substance Use: Not Applicable Psych Involvement: No (comment)  Admission diagnosis:  Acute pericarditis [I30.9] Patient Active Problem List   Diagnosis Date Noted   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   Overweight (BMI 25.0-29.9) 05/05/2023   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, 41-49%) (HCC) 05/05/2023   Acute kidney injury superimposed on chronic kidney disease (HCC) 05/04/2023   Atrial fibrillation with rapid ventricular response (HCC) 05/04/2023   Normocytic anemia 05/03/2023   Shock (HCC) 05/03/2023   ST elevation myocardial infarction (STEMI) (HCC) 05/03/2023   Tachycardia 05/03/2023   Acute pericarditis 05/02/2023   Shock circulatory (HCC) 05/02/2023   Altered mental status 04/21/2023   Acute renal failure with acute 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) 04/17/2023   History of melanoma 04/17/2023   S/P reverse total shoulder arthroplasty, 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 04/17/2023   Hallucinations 04/17/2023   Hyperkalemia 04/17/2023   Acute  pain of left knee 04/17/2023   Delirium 04/17/2023   Acute kidney injury (HCC) 04/17/2023   CKD stage 3b, GFR 30-44 ml/min (HCC) 09/13/2022   Cervical spondylosis 10/07/2021   Hypothyroidism 07/23/2021   Low vitamin B12 level 08/09/2020   Irritability 10/18/2019   Gastroesophageal reflux disease 03/13/2019   Acute on chronic anemia 07/04/2018   Leucopenia 02/05/2018   Supraspinatus tendon tear, right,  initial encounter 02/16/2017   Infraspinatus tendon tear, right, initial encounter 02/16/2017   Biceps tendon rupture, right, initial encounter 02/16/2017   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   Hypotension 08/15/2016   Mixed hyperlipidemia 08/15/2016   Dizziness 12/30/2015   BPH (benign prostatic hyperplasia) 03/03/2015   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   Primary osteoarthritis of left knee 03/03/2015   H/O neoplasm 01/12/2015   History of nonmelanoma skin cancer 01/12/2015   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   Anemia 07/17/2014   Hepatosplenomegaly 07/17/2014   Neutropenia (HCC) 07/17/2014   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, [place] - [address] [place] [address] Phone: [phone] Fax: [phone]  [facility] - [place], [place] - [address] [facility] [address] [place] [address] [place] [address] Phone: [phone] Fax: [phone]     Social Determinants of Health (SDOH) Social History: SDOH Screenings   Food Insecurity: No Food Insecurity (05/02/2023)  Housing: Low Risk  (05/02/2023)  Transportation Needs: No Transportation Needs (05/02/2023)  Utilities: Not At Risk (05/02/2023)  Alcohol Screen: Low Risk  (07/12/2021)  Depression (PHQ2-9): Low Risk  (04/04/2023)  Financial Resource Strain: Low Risk  (07/12/2021)  Physical Activity: Inactive (07/12/2021)  Social Connections: Socially Integrated (07/12/2021)  Stress: No Stress Concern Present (07/12/2021)  Tobacco Use: Medium Risk (05/02/2023)   SDOH Interventions:     Readmission Risk Interventions    04/21/2023   10:16 AM  Readmission Risk Prevention Plan  Transportation Screening Complete  HRI or Home Care Consult Complete  Social Work Consult for  Recovery Care Planning/Counseling Complete  Palliative Care Screening Not Applicable  Medication Review Oceanographer) Complete

## 2023-05-06 NOTE — [place] FL2 (Signed)
[place] MEDICAID FL2 LEVEL OF CARE FORM     IDENTIFICATION  Patient Name: [name] Birthdate: 10/10/1940 Sex: male Admission Date (Current Location): 05/02/2023  [facility] and [place] Number:  Chiropodist and Address:  [facility], [address] [place], [place] [address]      Provider Number:    Attending Physician Name and Address:  Marrion Coy, MD  Relative Name and Phone Number:  Cashton Kustra    Current Level of Care: Hospital Recommended Level of Care: Skilled Nursing Facility Prior Approval Number:    Date Approved/Denied:   PASRR Number: [id number]  Discharge Plan: SNF    Current Diagnoses: Patient Active Problem List   Diagnosis Date Noted   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   Overweight (BMI 25.0-29.9) 05/05/2023   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, 41-49%) (HCC) 05/05/2023   Acute kidney injury superimposed on chronic kidney disease (HCC) 05/04/2023   Atrial fibrillation with rapid ventricular response (HCC) 05/04/2023   Normocytic anemia 05/03/2023   Shock (HCC) 05/03/2023   ST elevation myocardial infarction (STEMI) (HCC) 05/03/2023   Tachycardia 05/03/2023   Acute pericarditis 05/02/2023   Shock circulatory (HCC) 05/02/2023   Altered mental status 04/21/2023   Acute renal failure with acute 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) 04/17/2023   History of melanoma 04/17/2023   S/P reverse total shoulder arthroplasty, 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 04/17/2023   Hallucinations 04/17/2023   Hyperkalemia 04/17/2023   Acute pain of left knee 04/17/2023   Delirium 04/17/2023   Acute kidney injury (HCC) 04/17/2023   CKD stage 3b, GFR 30-44 ml/min (HCC) 09/13/2022   Cervical spondylosis 10/07/2021   Hypothyroidism 07/23/2021   Low vitamin B12 level 08/09/2020   Irritability 10/18/2019   Gastroesophageal reflux disease 03/13/2019    Acute on chronic anemia 07/04/2018   Leucopenia 02/05/2018   Supraspinatus tendon tear, right, initial encounter 02/16/2017   Infraspinatus tendon tear, right, initial encounter 02/16/2017   Biceps tendon rupture, right, initial encounter 02/16/2017   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   Hypotension 08/15/2016   Mixed hyperlipidemia 08/15/2016   Dizziness 12/30/2015   BPH (benign prostatic hyperplasia) 03/03/2015   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   Primary osteoarthritis of left knee 03/03/2015   H/O neoplasm 01/12/2015   History of nonmelanoma skin cancer 01/12/2015   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   Anemia 07/17/2014   Hepatosplenomegaly 07/17/2014   Neutropenia (HCC) 07/17/2014   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    Orientation RESPIRATION BLADDER Height & Weight     Self, Time, Situation, Place  Normal External catheter Weight: 191 lb 12.8 oz (87 kg) Height:  5\' 10"  (177.8 cm)  BEHAVIORAL SYMPTOMS/MOOD NEUROLOGICAL BOWEL NUTRITION STATUS      Continent Diet (Fluid Consistency)  AMBULATORY STATUS COMMUNICATION OF NEEDS Skin   Extensive Assist Verbally                         Personal Care Assistance Level of Assistance  Bathing, Feeding, Dressing, Total care Bathing Assistance: Maximum assistance Feeding assistance: Maximum assistance Dressing Assistance: Maximum assistance Total Care Assistance: Maximum assistance   Functional Limitations Info  Sight, Hearing, Speech Sight Info: Adequate Hearing Info: Adequate Speech Info: Adequate    SPECIAL CARE FACTORS FREQUENCY  Contractures Contractures Info: Not present    Additional Factors Info  Code Status Code Status Info: FULL             Current Medications (05/06/2023):  This is the current hospital active medication list Current  Facility-Administered Medications  Medication Dose Route Frequency Provider Last Rate Last Admin   acetaminophen (TYLENOL) tablet 650 mg  650 mg Oral Q4H PRN End, Cristal Deer, MD   650 mg at 05/04/23 0834   bisacodyl (DULCOLAX) suppository 10 mg  10 mg Rectal Daily PRN Ezequiel Essex, NP       Chlorhexidine Gluconate Cloth 2 % PADS 6 each  6 each Topical Daily Rust-Chester, Cecelia Byars, NP   6 each at 05/04/23 0604   docusate sodium (COLACE) capsule 100 mg  100 mg Oral BID PRN Rust-Chester, Cecelia Byars, NP   100 mg at 05/03/23 1029   feeding supplement (ENSURE ENLIVE / ENSURE PLUS) liquid 237 mL  237 mL Oral TID BM Assaker, [place] Bali, MD   237 mL at 05/05/23 1406   heparin ADULT infusion 100 units/mL (25000 units/280mL)  1,600 Units/hr Intravenous Continuous Otelia Sergeant, RPH 16 mL/hr at 05/06/23 0600 1,600 Units/hr at 05/06/23 0600   levothyroxine (SYNTHROID) tablet 25 mcg  25 mcg Oral Q0600 End, Cristal Deer, MD   25 mcg at 05/06/23 0521   lidocaine (LIDODERM) 5 % 1 patch  1 patch Transdermal Q24H Assaker, [place] Bali, MD   1 patch at 05/05/23 1111   melatonin tablet 5 mg  5 mg Oral QHS PRN Dow Adolph N, DO   5 mg at 05/05/23 2309   [START ON 05/07/2023] metoprolol succinate (TOPROL-XL) 24 hr tablet 25 mg  25 mg Oral Daily Marrion Coy, MD       multivitamin (RENA-VIT) tablet 1 tablet  1 tablet Oral QHS Merryl Hacker, RPH   1 tablet at 05/05/23 2105   ondansetron (ZOFRAN) injection 4 mg  4 mg Intravenous Q6H PRN End, Christopher, MD       Oral care mouth rinse  15 mL Mouth Rinse PRN Rust-Chester, Cecelia Byars, NP       pantoprazole (PROTONIX) EC tablet 40 mg  40 mg Oral Daily End, Christopher, MD   40 mg at 05/06/23 0920   polyethylene glycol (MIRALAX / GLYCOLAX) packet 17 g  17 g Oral Daily PRN Dow Adolph N, DO   17 g at 05/03/23 1029   polyethylene glycol (MIRALAX / GLYCOLAX) packet 17 g  17 g Oral Daily Ezequiel Essex, NP   17 g at 05/05/23 0900   predniSONE (DELTASONE) tablet 20 mg  20 mg  Oral Q breakfast End, Christopher, MD   20 mg at 05/06/23 0920   simvastatin (ZOCOR) tablet 40 mg  40 mg Oral Daily End, Christopher, MD   40 mg at 05/06/23 1610   sodium chloride flush (NS) 0.9 % injection 3 mL  3 mL Intravenous Q12H End, Christopher, MD   3 mL at 05/06/23 9604   sodium chloride flush (NS) 0.9 % injection 3 mL  3 mL Intravenous PRN End, Cristal Deer, MD       Facility-Administered Medications Ordered in Other Encounters  Medication Dose Route Frequency Provider Last Rate Last Admin   epoetin alfa-epbx (RETACRIT) injection 10,000 Units  10,000 Units Subcutaneous Once Creig Hines, MD       epoetin alfa-epbx (RETACRIT) injection 40,000 Units  40,000 Units Subcutaneous Once Creig Hines, MD         Discharge Medications:  Please see discharge summary for a list of discharge medications.  Relevant Imaging Results:  Relevant Lab Results:   Additional Information [id number]  Colette Ribas, LCSWA

## 2023-05-06 NOTE — Consult Note (Signed)
PHARMACY - ANTICOAGULATION CONSULT NOTE  Pharmacy Consult for Heparin Indication: atrial fibrillation  Allergies  Allergen Reactions   Meloxicam Nausea And Vomiting   Patient Measurements: Height: 5\' 10"  (177.8 cm) Weight: 87 kg (191 lb 12.8 oz) IBW/kg (Calculated) : 73 Heparin Dosing Weight: 81.6 kg  Vital Signs: Temp: 97.9 F (36.6 C) (11/30 1237) Temp Source: Oral (11/30 1237) BP: 131/113 (11/30 1200) Pulse Rate: 101 (11/30 1237)  Labs: Recent Labs    05/04/23 0333 05/04/23 1147 05/04/23 1559 05/05/23 0432 05/05/23 1713 05/06/23 0259 05/06/23 1229  HGB 7.5* 7.7*  --  7.7*  --  9.0*  --   HCT 23.7* 24.7*  --  23.7*  --  27.5*  --   PLT 271  --   --  248  --  286  --   HEPARINUNFRC  --   --   --   --  <0.10* 0.10* 0.20*  CREATININE 5.07*  --  5.06* 5.01*  --  4.37*  --     Estimated Creatinine Clearance: 13.5 mL/min (A) (by C-G formula based on SCr of 4.37 mg/dL (H)).  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 male who presented with chest pain and SOB. PMH significant for HFpEF, HTN, HDL and CKD stage IIIb. EKG showed ST elevation. Cath showed mild nonobstructive CAD. Patient was newly diagnosed with atrial fibrillation with RVR. CHA2DS2-VASc score 6  (HTN, HF, age, HTN).   05/06/2023  - Hgb improved to 9 today after transfusing 1 unit PRBC on 11/29 - No issues with infusion reported, no signs/symptoms of bleeding noted.  Date Time HL Rate/Comment 11/29 1713 <0.10 SUBtherapeutic 11/30 0259 0.10 Subtherapeutic 11/30 1229 0.20 Subtherapeutic   Goal of Therapy:  Heparin level 0.3-0.7 units/ml Monitor platelets by anticoagulation protocol: Yes   Plan:  Give heparin bolus of 1200 units x1 Increase heparin infusion to 1750 units/hour Check anti-Xa level in 8 hours and daily while on heparin Continue to monitor H&H and platelets Daily CBC    Karlton Lemon, PharmD Candidate  05/06/2023

## 2023-05-06 NOTE — Consult Note (Signed)
PHARMACY - ANTICOAGULATION CONSULT NOTE  Pharmacy Consult for Heparin Indication: atrial fibrillation  Allergies  Allergen Reactions   Meloxicam Nausea And Vomiting   Patient Measurements: Height: 5\' 10"  (177.8 cm) Weight: 87 kg (191 lb 12.8 oz) IBW/kg (Calculated) : 73 Heparin Dosing Weight: 81.6 kg  Vital Signs: Temp: 98.2 F (36.8 C) (11/30 2023) Temp Source: Oral (11/30 2023) BP: 156/95 (11/30 2023) Pulse Rate: 106 (11/30 2023)  Labs: Recent Labs    05/04/23 0333 05/04/23 1147 05/04/23 1559 05/05/23 0432 05/05/23 1713 05/06/23 0259 05/06/23 1229  HGB 7.5* 7.7*  --  7.7*  --  9.0*  --   HCT 23.7* 24.7*  --  23.7*  --  27.5*  --   PLT 271  --   --  248  --  286  --   HEPARINUNFRC  --   --   --   --  <0.10* 0.10* 0.20*  CREATININE 5.07*  --  5.06* 5.01*  --  4.37*  --     Estimated Creatinine Clearance: 13.5 mL/min (A) (by C-G formula based on SCr of 4.37 mg/dL (H)).  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 male who presented with chest pain and SOB. PMH significant for HFpEF, HTN, HDL and CKD stage IIIb. EKG showed ST elevation. Cath showed mild nonobstructive CAD. Patient was newly diagnosed with atrial fibrillation with RVR. CHA2DS2-VASc score 6  (HTN, HF, age, HTN).   05/06/2023  - Hgb improved to 9 today after transfusing 1 unit PRBC on 11/29 - No issues with infusion reported, no signs/symptoms of bleeding noted.  Date Time HL Rate/Comment 11/29 1713 <0.10 SUBtherapeutic 11/30 0259 0.10 Subtherapeutic 11/30 1229 0.20 Subtherapeutic 11/30 2150 0.23 Subtherapeutic  Goal of Therapy:  Heparin level 0.3-0.7 units/ml Monitor platelets by anticoagulation protocol: Yes   Plan:  Give heparin bolus of 1200 units x1 Increase heparin infusion to 1900 units/hour Check anti-Xa level in 8 hours and daily while on heparin Continue to monitor H&H and platelets Daily CBC    Paulita Fujita, PharmD Clinical Pharmacist 05/06/2023

## 2023-05-06 NOTE — Progress Notes (Signed)
Patient Name: [name] Date of Encounter: 05/06/2023 [facility] Cardiologist: None   Interval Summary  .    Feeling well.  Denies CP, shortness of breath or palpitations.   Vital Signs .    Vitals:   05/06/23 0300 05/06/23 0400 05/06/23 0500 05/06/23 0600  BP: (!) 144/89 (!) 156/92 139/88 (!) 142/94  Pulse: (!) 105 (!) 102 (!) 105 100  Resp: 17 18 20 18   Temp:  98.4 F (36.9 C)    TempSrc:      SpO2: 94% 94% 93% 94%  Weight:   87 kg   Height:        Intake/Output Summary (Last 24 hours) at 05/06/2023 1016 Last data filed at 05/06/2023 0600 Gross per 24 hour  Intake 1068.39 ml  Output 1500 ml  Net -431.61 ml      05/06/2023    5:00 AM 05/05/2023    5:00 AM 05/05/2023    4:57 AM  Last 3 Weights  Weight (lbs) 191 lb 12.8 oz 193 lb 9 oz 122 lb 2.2 oz  Weight (kg) 87 kg 87.8 kg 55.4 kg      Telemetry/ECG    Atrial fibrillation.  Rate 110-120s.  - Personally Reviewed  Echo 05/03/23:  1. Left ventricular ejection fraction, by estimation, is 40 to 45%. The  left ventricle has mildly decreased function. The left ventricle  demonstrates global hypokinesis. There is mild left ventricular  hypertrophy. Left ventricular diastolic parameters  are indeterminate.   2. Right ventricular systolic function is mildly reduced. The right  ventricular size is normal. There is mildly elevated pulmonary artery  systolic pressure. The estimated right ventricular systolic pressure is  41.8 mmHg.   3. Left atrial size was moderately dilated.   4. The mitral valve is normal in structure. Mild mitral valve  regurgitation. No evidence of mitral stenosis.   5. Tricuspid valve regurgitation is mild to moderate.   6. The aortic valve is normal in structure. Aortic valve regurgitation is  not visualized. No aortic stenosis is present.   7. The inferior vena cava is dilated in size with <50% respiratory  variability, suggesting right atrial pressure of 15 mmHg.    LHC  05/02/23: Conclusions: Mild, non-obstructive coronary artery disease. Mildly elevated left ventricular filling pressure. Bedside ultrasound with mildly reduced LVEF with global hypokinesis and small pericardial effusion.  Query pericarditis/myocarditis.   Recommendations: Admit to stepdown. Start amiodarone bolus/infusion for atrial fibrillation with rapid ventricular response. PRBC transfusion for target hemoglobin greater than 8. If hemoglobin stable and no enlargement of pericardial effusion, will need to consider heparin infusion in the setting of a-fib with RVR. Physical Exam .    VS:  BP (!) 142/94   Pulse 100   Temp 98.4 F (36.9 C)   Resp 18   Ht 5\' 10"  (1.778 m)   Wt 87 kg   SpO2 94%   BMI 27.52 kg/m  , BMI Body mass index is 27.52 kg/m. GENERAL:  Well appearing HEENT: Pupils equal round and reactive, fundi not visualized, oral mucosa unremarkable NECK:  No jugular venous distention, waveform within normal limits, carotid upstroke brisk and symmetric, no bruits, no thyromegaly LUNGS:  Clear to auscultation bilaterally HEART:  Tahcycardic.  Irregularly irregular.  PMI not displaced or sustained,S1 and S2 within normal limits, no S3, no S4, no clicks, no rubs, no murmurs ABD:  Flat, positive bowel sounds normal in frequency in pitch, no bruits, no rebound, no guarding, no midline  pulsatile mass, no hepatomegaly, no splenomegaly EXT:  2 plus pulses throughout, no edema, no cyanosis no clubbing SKIN:  No rashes no nodules NEURO:  Cranial nerves II through XII grossly intact, motor grossly intact throughout PSYCH:  Cognitively intact, oriented to person place and time   Assessment & Plan .     59M with hypertension, hyperlipidemia, PAD, CVA, myelofibrosis with chronic anemia, CKD 3B-4 admitted with pericarditis.  # Pericarditis/Myocarditis: Initially there was concern for STEMI.  He underwent left heart cath and was found to have mild, non-obstructive disease.  LVEF  40-45% with global hypokinesis.  Unfortunately had to be started on prednisone.  Only was able to receive 1 dose of colchicine due to renal dysfunction.  Also not a candidate for NSAIDs due to his kidney function.  # Persistent atrial fibrillation: Remains in atrial fibrillation.  Metoprolol was started 11/29 and dose increased today.  Will increase to 50mg  given poor rate control and BP room.  He was started on IV heparin.  Agree with getting a limited echo to make sure there is no evidence of pericardial effusion in the setting of acute pericarditis.  # Myelofibrosis: # Chronic anemia: Transfused 11/29.  Agree with maintaining hgb >8.  # CKD: Baseline creatinine around  1.9.  This admission it peaked at 5.06.  Now downtrending today.    For questions or updates, please contact [facility] Please consult [email or web address] for contact info under        Signed, Chilton Si, MD

## 2023-05-06 NOTE — Progress Notes (Signed)
Patient Name: [name] Date of Encounter: 05/06/2023 [facility] Cardiologist: Mariah Milling  Interval Summary  .    No chest pain, dyspnea, or palpitations. No hematochezia or melena. BP improved into the 120s to 160s mmHg systolic. Remains in Afib with rates in the low 100s trending into the 120s to 130s bpm.   Vital Signs .    Vitals:   05/06/23 0300 05/06/23 0400 05/06/23 0500 05/06/23 0600  BP: (!) 144/89 (!) 156/92 139/88 (!) 142/94  Pulse: (!) 105 (!) 102 (!) 105 100  Resp: 17 18 20 18   Temp:  98.4 F (36.9 C)    TempSrc:      SpO2: 94% 94% 93% 94%  Weight:   87 kg   Height:        Intake/Output Summary (Last 24 hours) at 05/06/2023 1017 Last data filed at 05/06/2023 0600 Gross per 24 hour  Intake 1068.39 ml  Output 1500 ml  Net -431.61 ml      05/06/2023    5:00 AM 05/05/2023    5:00 AM 05/05/2023    4:57 AM  Last 3 Weights  Weight (lbs) 191 lb 12.8 oz 193 lb 9 oz 122 lb 2.2 oz  Weight (kg) 87 kg 87.8 kg 55.4 kg      Telemetry/ECG    Atrial fibrillation with heart rates predominantly low 100s to 130s bpm - Personally Reviewed  Physical Exam .   GEN: No acute distress.  Family is at the bedside. Neck: No JVD Cardiac: Tachycardic, irregularly irregular without murmurs, rubs, or gallops. Respiratory: Mildly diminished breath sounds throughout without wheezes or crackles. GI: Soft, nontender, non-distended  MS: No right arm swelling.  No lower extremity edema.  Assessment & Plan .     Acute pericarditis: Chest and abdominal pain have resolved with addition of prednisone.  EKG 11/28 showed persistent widespread ST elevations, though less pronounced than on prior tracings upon presentation.  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  ESR and CRP noted to be markedly elevated on admission.  Etiology for pericarditis remains uncertain.  Considerations include idiopathic, drug-induced with recent initiation of hydralazine, or  uremic, though nephrology thinks relatively sudden onset of AKI would be less likely to cause acute pericarditis.  ANA negative. -Continue prednisone 20 mg daily until inflammatory biomarkers have normalized (ordered for 12/1). -Colchicine 0.3 mg given x 1 on 11/26.  Would not redose at this time in the setting of acute kidney injury superimposed on chronic kidney disease. -Unable to use NSAIDs with renal dysfunction.  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 -Monitor on heparin gtt.   Atrial fibrillation with rapid ventricular response: New diagnosis for the patient.  May have been precipitated by acute pericarditis.  Patient is high risk for cardioembolic events in the setting of his acute inflammatory process and other comorbidities with a CHA2DS2-VASc score of at least 6.  He is also at high risk for bleeding complications.  Has been started on heparin gtt while admitted, tolerating at this point. -Uncertain if he would be a long-term OAC candidate, consider Watchman evaluation in the outpatient setting.  -Obtain limited echo to evaluate for enlargement of pericardial effusion on heparin gtt.  -Titrate Toprol XL to 25 mg daily for added ventricular rate control.   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  EF may be somewhat underestimated in the setting of atrial fibrillation.  Mr.  Rayer appears fairly euvolemic. -Maintain net even fluid balance. -Increase Toprol XL to 25 mg daily.  -Relative hypotension and acute on CKD preclude escalation of GDMT at this time.   Acute kidney injury superimposed on chronic kidney disease: -Creatinine seems to have plateaued around 5 (baseline has been 2-2.5, noted to be 4.5 on admission).  An element of CIN could be contributing to further rise in creatinine, though only 25 mL of contrast were used for diagnostic catheterization.   -Renal function  improving.  -Continue supportive care.  Appreciate nephrology recommendations. -Target net even fluid balance.      For questions or updates, please contact [facility] Please consult [email or web address] for contact info under [facility].    Signed, Eula Listen, PA-C

## 2023-05-06 NOTE — Progress Notes (Signed)
Pt called out for assistance.  On entering the patient is breathing rapidly, flushed, c/o being hot, increased work of breathing and requesting oxygen.  States he was moving around to get his urinal and became suddenly winded.    VS taken - tachycardia and tachypneic.  Pt coached to slow breathing.   Fan provided, re-positioned, placed on O2 at 2L [place].   HR normal and respiratory rate 21.  Pt resting comfortably with no complaints

## 2023-05-06 NOTE — Progress Notes (Addendum)
Progress Note   Patient: [name] QIO:[id number] DOB: 16-Jan-1941 DOA: 05/02/2023     4 DOS: the patient was seen and examined on 05/06/2023   Brief hospital course: [name] is a 82 y.o. male with medical history significant of chronic HFpEF, hypertension, hyperlipidemia, myelofibrosis, CKD stage IIIb, anemia of chronic disease, who presented initially to [facility] ED with chest pain and shortness of breath.  Troponin 41, EKG showed ST elevation.  Patient was taken to the Cath Lab, did not show any occlusion in coronary arteries.  Condition consistent with acute pericarditis.  Treated with colchicine, then changed to prednisone. Also has significant worsening renal function at the time of admission, associated with hyperkalemia.  Patient was given IV fluids, Lokelma.  Followed by nephrology. 11/28.  Patient developed atrial fibrillation, seen by cardiology, placed on heparin drip. 11/29.  Transfuse 1 unit of PRBC.   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 Chronic systolic congestive heart failure. STEMI ruled out.  Patient has been seen by cardiology, cardiac cath did not show significant coronary artery disease.  Patient has acute pericarditis.  She is treated with a lower dose of prednisone to 20 mg daily. Echocardiogram showed ejection fraction 40 to 45%, mild pulm  hypertension.  Did not show any pericardial effusion. Patient is a followed by cardiology.   New onset atrial fibrillation with rapid ventricular response. Patient followed by cardiology, started on heparin drip. Heart rate is still fast, increase metoprolol to 25 mg daily.   Myelodysplastic syndrome. Acute on chronic anemia. Received 1 unit PRBC on 11/29, hemoglobin is better today.   Acute hypoxemic respiratory failure. Patient had a signal short of breath, potentially from pericarditis.  Oxygen saturation dropped down to 87%, patient on 2 L oxygen.  Patient is also followed by nephrology and cardiology for volume status.  Currently does not have volume overload.   Patient condition improved, off oxygen today with good saturation.   SIRS. Likely hypovolemic shock. Patient had a significant hypotension, resulting in severe acute kidney injury.  He also has a mild elevation of procalcitonin level, but could not identify any source of infection. He was placed on broad-spectrum antibiotics with vancomycin and cefepime.  Blood culture has no growth.  Procalcitonin level still elevated, I could not rule out a source of pneumonia.  Repeat chest x-ray still did not see a pneumonia, antibiotics discontinued.  Procalcitonin level now at 0.8.   Acute renal failure on chronic kidney disease stage IIIb secondary to ATN. Hyperkalemia secondary to acute renal failure. Metabolic acidosis secondary to acute renal failure. Hyponatremia. Patient also received IV contrast, but renal function already worsened at the time of admission.  Worsening renal function appears to be secondary to  hypotension.  Patient was on IV pressor, was able to wean off 11/28. Blood pressure is more stable now.   Renal function continue to improve today, will remove Foley catheter.  Addendum 1656: Patient is able to urinate without difficulty.      Anasarca. Right arm lymphedema. CT scan abdomen/pelvis showed evidence of  anasarca, this could be secondary to renal failure. Right arm lymphedema, no evidence of infection.  Seen by orthopedics.           Subjective:  Patient feel much better, off oxygen.  No significant short of breath.  Physical Exam: Vitals:   05/06/23 0300 05/06/23 0400 05/06/23 0500 05/06/23 0600  BP: (!) 144/89 (!) 156/92 139/88 (!) 142/94  Pulse: (!) 105 (!) 102 (!) 105 100  Resp: 17 18 20 18   Temp:  98.4 F (36.9 C)    TempSrc:      SpO2: 94% 94% 93% 94%  Weight:   87 kg   Height:       General exam: Appears calm and comfortable  Respiratory system: Clear to auscultation. Respiratory effort normal. Cardiovascular system: S1 & S2 heard, RRR. No JVD, murmurs, rubs, gallops or clicks. No pedal edema. Gastrointestinal system: Abdomen is nondistended, soft and nontender. No organomegaly or masses felt. Normal bowel sounds heard. Central nervous system: Alert and oriented. No focal neurological deficits. Extremities: Right arm edema improving. Skin: No rashes, lesions or ulcers Psychiatry: Judgement and insight appear normal. Mood & affect appropriate.    Data Reviewed:  Lab results reviewed.  Family Communication: Wife updated in the room  Disposition: Status is: Inpatient Remains inpatient appropriate because: Severity of disease,     Time spent: 35 minutes  Author: Marrion Coy, MD 05/06/2023 10:15 AM  For on call review [email or web address].

## 2023-05-06 NOTE — Progress Notes (Signed)
[facility] [place], [place] 05/06/23  Subjective:   Hospital day # 4   Patient known to our practice from outpatient follow-up of CKD.  Last seen on October 30.  He was started on Farxiga at that time.  Previous autoimmune workup in June 2024 negative ANA, anti-GBM, PLA2R, ANCA.  Creatinine as outpatient was 2.58/GFR 24 from April 05, 2023.  Patient underwent reverse right total shoulder arthroplasty on April 13, 2023. He presented to the emergency room on November 11 for right knee pain and possible hallucinations.  He was admitted from 04/17/2023 and discharged on 04/21/2023 Creatinine at the time of discharge was 2.52/GFR 25 which is at his baseline. Potassium was mildly elevated at 5.5.  Hemoglobin was 8.3.  Losartan was stopped and he was started on hydralazine 25 mg 3 times per day.  He was also prescribed gabapentin 600 mg 3 times per day.  He presented to the emergency room for complaints of hard to arouse, generalized pain.  Triage notes indicate generalized weakness.  Blood pressure was 90/86 upon arrival with heart rate of 134.  He was noted to have elevated ST segment on EKG and thought to have STEMI.  He underwent emergent cardiac catheterization which showed mild nonobstructive coronary disease, mildly reduced LVEF with global hypokinesis and small pericardial effusion.  A question was raised about pericarditis versus myocarditis.  He was started on amiodarone for atrial fibrillation with RVR.    Patient seen at bedside.  Sitting up in the chair.  States he is able to eat without nausea or vomiting. Serum creatinine has improved to 4.37.  Urine output of 1500 cc.  Foley catheter in place.   Objective:  Vital signs in last 24 hours:  Temp:  [97.6 F (36.4 C)-98.7 F (37.1 C)] 97.9 F (36.6 C) (11/30 1237) Pulse Rate:  [37-121] 101 (11/30 1237) Resp:  [13-26] 22 (11/30 1237) BP: (130-166)/(69-131) 131/113 (11/30 1200) SpO2:  [77 %-100 %] 94 % (11/30  1237) Weight:  [87 kg] 87 kg (11/30 0500)  Weight change: 31.6 kg Filed Weights   05/05/23 0457 05/05/23 0500 05/06/23 0500  Weight: 55.4 kg 87.8 kg 87 kg    Intake/Output:    Intake/Output Summary (Last 24 hours) at 05/06/2023 1253 Last data filed at 05/06/2023 0600 Gross per 24 hour  Intake 1068.39 ml  Output 1500 ml  Net -431.61 ml     Physical Exam: General: Ill-appearing, sitting up bedside in chair  HEENT Moist oral mucous membranes  Pulm/lungs coarse breath sounds at left base  CVS/Heart Irregular rhythm, tachycardic  Abdomen:  Soft, tympanic, distended  Extremities: Trace peripheral edema  Neurologic: Alert, able to answer simple questions  Skin: Warm, dry  Access:    Foley in place    Basic Metabolic Panel:  Recent Labs  Lab 05/03/23 0532 05/03/23 1115 05/03/23 2309 05/04/23 0333 05/04/23 1559 05/05/23 0432 05/06/23 0259  NA 133* 133* 133* 131* 131* 134* 131*  K 5.5* 5.2* 4.9 4.8 4.6 4.5 4.6  CL 105 103 100 100 100 99 96*  CO2 19* 18* 19* 17* 18* 20* 21*  GLUCOSE 152* 124* 134* 123* 192* 150* 138*  BUN 70* 74* 78* 79* 84* 89* 95*  CREATININE 4.91* 4.88* 5.01* 5.07* 5.06* 5.01* 4.37*  CALCIUM 6.9* 6.9* 6.7* 6.7* 6.7* 6.7* 6.7*  MG 2.0  --   --  2.0 2.0 1.9 2.1  PHOS 7.6* 8.1*  --  7.7* 7.5* 6.6*  --      CBC:  Recent Labs  Lab 05/02/23 1516 05/02/23 2205 05/03/23 0256 05/03/23 0532 05/03/23 1326 05/03/23 2309 05/04/23 0333 05/04/23 1147 05/05/23 0432 05/06/23 0259  WBC 16.3* 16.9*  --  16.1*  --   --  7.1  --  5.8 8.6  NEUTROABS 12.0*  --   --   --   --   --   --   --   --   --   HGB 7.0* 7.3*   < > 7.8*   < > 7.7* 7.5* 7.7* 7.7* 9.0*  HCT 22.8* 24.0*   < > 24.9*   < > 23.8* 23.7* 24.7* 23.7* 27.5*  MCV 94.2 93.4  --  91.2  --   --  90.8  --  88.1 89.0  PLT 363 363  --  342  --   --  271  --  248 286   < > = values in this interval not displayed.     No results found for: "HEPBSAG", "HEPBSAB",  "HEPBIGM"    Microbiology:  Recent Results (from the past 240 hour(s))  MRSA Next Gen by PCR, Nasal     Status: None   Collection Time: 05/02/23  5:32 PM   Specimen: Nasal Mucosa; Nasal Swab  Result Value Ref Range Status   MRSA by PCR Next Gen NOT DETECTED NOT DETECTED Final    Comment: (NOTE) The GeneXpert MRSA Assay (FDA approved for NASAL specimens only), is one component of a comprehensive MRSA colonization surveillance program. It is not intended to diagnose MRSA infection nor to guide or monitor treatment for MRSA infections. Test performance is not FDA approved in patients less than 6 years old. Performed at [facility], [address] [place], [place] [address]   SARS Coronavirus 2 by RT PCR (hospital order, performed in [facility] hospital lab) *cepheid single result test* Anterior Nasal Swab     Status: None   Collection Time: 05/02/23  9:04 PM   Specimen: Anterior Nasal Swab  Result Value Ref Range Status   SARS Coronavirus 2 by RT PCR NEGATIVE NEGATIVE Final    Comment: (NOTE) SARS-CoV-2 target nucleic acids are NOT DETECTED.  The SARS-CoV-2 RNA is generally detectable in upper and lower respiratory specimens during the acute phase of infection. The lowest concentration of SARS-CoV-2 viral copies this assay can detect is 250 copies / mL. A negative result does not preclude SARS-CoV-2 infection and should not be used as the sole basis for treatment or other patient management decisions.  A negative result may occur with improper specimen collection / handling, submission of specimen other than nasopharyngeal swab, presence of viral mutation(s) within the areas targeted by this assay, and inadequate number of viral copies (<250 copies / mL). A negative result must be combined with clinical observations, patient history, and epidemiological information.  Fact Sheet for Patients:   RoadLapTop.co.za  Fact Sheet for Healthcare  Providers: http://kim-miller.com/  This test is not yet approved or  cleared by the [place] FDA and has been authorized for detection and/or diagnosis of SARS-CoV-2 by FDA under an Emergency Use Authorization (EUA).  This EUA will remain in effect (meaning this test can be used) for the duration of the COVID-19 declaration under Section 564(b)(1) of the Act, 21 U.S.C. section 360bbb-3(b)(1), unless the authorization is terminated or revoked sooner.  Performed at [facility], [address] [place], [place] [address]   Respiratory (~20 pathogens) panel by PCR     Status: None   Collection  Time: 05/02/23  9:04 PM   Specimen: Nasopharyngeal Swab; Respiratory  Result Value Ref Range Status   Adenovirus NOT DETECTED NOT DETECTED Final   Coronavirus 229E NOT DETECTED NOT DETECTED Final    Comment: (NOTE) The Coronavirus on the Respiratory Panel, DOES NOT test for the novel  Coronavirus (2019 nCoV)    Coronavirus HKU1 NOT DETECTED NOT DETECTED Final   Coronavirus NL63 NOT DETECTED NOT DETECTED Final   Coronavirus OC43 NOT DETECTED NOT DETECTED Final   Metapneumovirus NOT DETECTED NOT DETECTED Final   Rhinovirus / Enterovirus NOT DETECTED NOT DETECTED Final   Influenza A NOT DETECTED NOT DETECTED Final   Influenza B NOT DETECTED NOT DETECTED Final   Parainfluenza Virus 1 NOT DETECTED NOT DETECTED Final   Parainfluenza Virus 2 NOT DETECTED NOT DETECTED Final   Parainfluenza Virus 3 NOT DETECTED NOT DETECTED Final   Parainfluenza Virus 4 NOT DETECTED NOT DETECTED Final   Respiratory Syncytial Virus NOT DETECTED NOT DETECTED Final   Bordetella pertussis NOT DETECTED NOT DETECTED Final   Bordetella Parapertussis NOT DETECTED NOT DETECTED Final   Chlamydophila pneumoniae NOT DETECTED NOT DETECTED Final   Mycoplasma pneumoniae NOT DETECTED NOT DETECTED Final    Comment: Performed at [facility], [address] [place], [place] [address]   Culture, blood (Routine X 2) w Reflex to ID Panel     Status: None (Preliminary result)   Collection Time: 05/02/23 10:05 PM   Specimen: BLOOD  Result Value Ref Range Status   Specimen Description BLOOD RIGHT WRIST  Final   Special Requests   Final    BOTTLES DRAWN AEROBIC AND ANAEROBIC Blood Culture adequate volume   Culture   Final    NO GROWTH 4 DAYS Performed at [facility], [address], [place], [place] [address]    Report Status PENDING  Incomplete  Culture, blood (Routine X 2) w Reflex to ID Panel     Status: None (Preliminary result)   Collection Time: 05/02/23 10:05 PM   Specimen: BLOOD  Result Value Ref Range Status   Specimen Description BLOOD RIGHT HAND  Final   Special Requests   Final    BOTTLES DRAWN AEROBIC ONLY Blood Culture adequate volume   Culture   Final    NO GROWTH 4 DAYS Performed at [facility], [address] [place], [place] [address]    Report Status PENDING  Incomplete    Coagulation Studies: No results for input(s): "LABPROT", "INR" in the last 72 hours.   Urinalysis: No results for input(s): "COLORURINE", "LABSPEC", "PHURINE", "GLUCOSEU", "HGBUR", "BILIRUBINUR", "KETONESUR", "PROTEINUR", "UROBILINOGEN", "NITRITE", "LEUKOCYTESUR" in the last 72 hours.  Invalid input(s): "APPERANCEUR"     Imaging: No results found.   Medications:    heparin 1,600 Units/hr (05/06/23 0600)    Chlorhexidine Gluconate Cloth  6 each Topical Daily   feeding supplement  237 mL Oral TID BM   levothyroxine  25 mcg Oral Q0600   lidocaine  1 patch Transdermal Q24H   [START ON 05/07/2023] metoprolol succinate  50 mg Oral Daily   multivitamin  1 tablet Oral QHS   pantoprazole  40 mg Oral Daily   polyethylene glycol  17 g Oral Daily   predniSONE  20 mg Oral Q breakfast   simvastatin  40 mg Oral Daily   sodium chloride flush  3 mL Intravenous Q12H   acetaminophen, bisacodyl, docusate sodium, melatonin, ondansetron (ZOFRAN) IV, mouth  rinse, polyethylene glycol, sodium chloride flush  Assessment/ Plan:  82 y.o. 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  Baseline creatinine of 2.5/GFR 25 on April 21, 2023 AKI is likely multifactorial.  Urinalysis from May 02, 2023 shows proteinuria, negative for blood.  0-5 RBCs.  0-5 WBCs.  Low likelihood for acute glomerulonephritis or acute interstitial nephritis. He did present with hypotension which may have led to ATN causing AKI which was subsequently exacerbated by IV contrast exposure from cardiac catheterization.  Urine output of about 1500 cc yesterday. His electrolytes and volume status are acceptable at present. No acute indication for dialysis.  We will follow closely on a daily basis. -Maintain hemodynamic stability and avoid hypotension. -Avoid IV contrast, nonsteroidals. -Agree to hold hydralazine, Marcelline Deist. - urine output appears to be improving.    #Acute pericarditis Autoimmune workup negative from November 2024. Other differential includes viral myocarditis versus other causes. Currently getting treatment with steroids.   Low likelihood of uremic pericarditis in the setting of relatively recent onset of AKI  #Hyperkalemia Lokelma can be given as needed.  #Proteinuria Patient is noted to have proteinuria even as outpatient. Previous autoimmune workup in June 2024 was negative. Urine protein to creatinine ratio 4.7 g In October 2024. He has known numerous simple and minimally complex renal cysts bilaterally.  CT abdomen from May 02, 2023 is negative for ureteral calculi, hydronephrosis.    LOS: 4 [place] Kenley Thedore Mins 11/30/202412:53  PM  [facility] [place], [place] [phone]  Note: This note was prepared with Dragon dictation. Any transcription errors are unintentional

## 2023-05-07 ENCOUNTER — Inpatient Hospital Stay (HOSPITAL_COMMUNITY)
Admit: 2023-05-07 | Discharge: 2023-05-07 | Disposition: A | Discharge status: Home / Self Care | Payer: Medicare Other | Attending: Physician Assistant | Admitting: Physician Assistant

## 2023-05-07 DIAGNOSIS — I4891 Unspecified atrial fibrillation: Secondary | ICD-10-CM | POA: Diagnosis not present

## 2023-05-07 DIAGNOSIS — I3139 Other pericardial effusion (noninflammatory): Secondary | ICD-10-CM | POA: Diagnosis not present

## 2023-05-07 DIAGNOSIS — N171 Acute kidney failure with acute cortical necrosis: Secondary | ICD-10-CM

## 2023-05-07 DIAGNOSIS — N184 Chronic kidney disease, stage 4 (severe): Secondary | ICD-10-CM | POA: Diagnosis not present

## 2023-05-07 DIAGNOSIS — I309 Acute pericarditis, unspecified: Secondary | ICD-10-CM | POA: Diagnosis not present

## 2023-05-07 DIAGNOSIS — I5021 Acute systolic (congestive) heart failure: Secondary | ICD-10-CM | POA: Diagnosis not present

## 2023-05-07 DIAGNOSIS — D649 Anemia, unspecified: Secondary | ICD-10-CM | POA: Diagnosis not present

## 2023-05-07 LAB — BASIC METABOLIC PANEL
Anion gap: 13 (ref 5–15)
BUN: 96 mg/dL — ABNORMAL HIGH (ref 8–23)
CO2: 21 mmol/L — ABNORMAL LOW (ref 22–32)
Calcium: 7.6 mg/dL — ABNORMAL LOW (ref 8.9–10.3)
Chloride: 103 mmol/L (ref 98–111)
Creatinine, Ser: 3.78 mg/dL — ABNORMAL HIGH (ref 0.61–1.24)
GFR, Estimated: 15 mL/min — ABNORMAL LOW (ref >60)
Glucose, Bld: 105 mg/dL — ABNORMAL HIGH (ref 70–99)
Potassium: 4.7 mmol/L (ref 3.5–5.1)
Sodium: 137 mmol/L (ref 135–145)

## 2023-05-07 LAB — ECHOCARDIOGRAM LIMITED
AR max vel: 2.15 cm2
AV Peak grad: 14.9 mm[Hg]
Ao pk vel: 1.93 m/s
Calc EF: 43.9 %
Height: 70 in
S' Lateral: 3.6 cm
Single Plane A2C EF: 44.2 %
Single Plane A4C EF: 47.4 %
Weight: 3153.6 [oz_av]

## 2023-05-07 LAB — CBC
HCT: 28.6 % — ABNORMAL LOW (ref 39.0–52.0)
Hemoglobin: 9 g/dL — ABNORMAL LOW (ref 13.0–17.0)
MCH: 28.2 pg (ref 26.0–34.0)
MCHC: 31.5 g/dL (ref 30.0–36.0)
MCV: 89.7 fL (ref 80.0–100.0)
Platelets: 301 10*3/uL (ref 150–400)
RBC: 3.19 MIL/uL — ABNORMAL LOW (ref 4.22–5.81)
RDW: 18.2 % — ABNORMAL HIGH (ref 11.5–15.5)
WBC: 8 10*3/uL (ref 4.0–10.5)
nRBC: 0.6 % — ABNORMAL HIGH (ref 0.0–0.2)

## 2023-05-07 LAB — CULTURE, BLOOD (ROUTINE X 2)
Culture: NO GROWTH
Culture: NO GROWTH
Special Requests: ADEQUATE
Special Requests: ADEQUATE

## 2023-05-07 LAB — HEPARIN LEVEL (UNFRACTIONATED)
Heparin Unfractionated: 0.39 [IU]/mL (ref 0.30–0.70)
Heparin Unfractionated: 0.32 [IU]/mL (ref 0.30–0.70)

## 2023-05-07 LAB — C-REACTIVE PROTEIN: CRP: 5 mg/dL — ABNORMAL HIGH (ref <1.0)

## 2023-05-07 LAB — SEDIMENTATION RATE: Sed Rate: 74 mm/h — ABNORMAL HIGH (ref 0–20)

## 2023-05-07 LAB — GLUCOSE, CAPILLARY
Glucose-Capillary: 105 mg/dL — ABNORMAL HIGH (ref 70–99)
Glucose-Capillary: 140 mg/dL — ABNORMAL HIGH (ref 70–99)
Glucose-Capillary: 164 mg/dL — ABNORMAL HIGH (ref 70–99)

## 2023-05-07 MED ORDER — METOPROLOL SUCCINATE ER 100 MG PO TB24
100.0000 mg | ORAL_TABLET | Freq: Every day | ORAL | Status: DC
  Administered 2023-05-08: 100 mg via ORAL
  Filled 2023-05-07: qty 1

## 2023-05-07 MED ORDER — IPRATROPIUM-ALBUTEROL 0.5-2.5 (3) MG/3ML IN SOLN
3.0000 mL | Freq: Four times a day (QID) | RESPIRATORY_TRACT | Status: DC | PRN
  Administered 2023-05-07: 3 mL via RESPIRATORY_TRACT
  Filled 2023-05-07: qty 3

## 2023-05-07 NOTE — Progress Notes (Signed)
Patient ID: [name], male   DOB: 31-Jul-1940, 82 y.o.   MRN: [id number]  Subjective: The patient has no complaints regarding the right shoulder and upper extremity.  He feels that the swelling has improved, as is the residual numbness he had been experiencing in his right upper extremity and hand.   Objective: Vital signs in last 24 hours: Temp:  [97.5 F (36.4 C)-98.2 F (36.8 C)] 98 F (36.7 C) (12/01 1227) Pulse Rate:  [86-106] 99 (12/01 1227) Resp:  [16-25] 16 (12/01 1227) BP: (134-156)/(81-100) 141/100 (12/01 1227) SpO2:  [92 %-98 %] 95 % (12/01 1227) Weight:  [89.4 kg] 89.4 kg (12/01 0500)  Intake/Output from previous day: 11/30 0701 - 12/01 0700 In: 492.7 [P.O.:200; I.V.:292.7] Out: 1325 [Urine:1325] Intake/Output this shift: Total I/O In: -  Out: 300 [Urine:300]  Recent Labs    05/05/23 0432 05/06/23 0259 05/07/23 0552  HGB 7.7* 9.0* 9.0*   Recent Labs    05/06/23 0259 05/07/23 0552  WBC 8.6 8.0  RBC 3.09* 3.19*  HCT 27.5* 28.6*  PLT 286 301   Recent Labs    05/06/23 0259 05/07/23 0552  NA 131* 137  K 4.6 4.7  CL 96* 103  CO2 21* 21*  BUN 95* 96*  CREATININE 4.37* 3.78*  GLUCOSE 138* 105*  CALCIUM 6.7* 7.6*   No results for input(s): "LABPT", "INR" in the last 72 hours.  Physical Exam: Orthopedic examination again is limited to the right shoulder and upper extremity.  The surgical incision remains well-healed and without evidence for infection.  No swelling, erythema, ecchymosis, abrasions, or other skin abnormalities are identified.  He has no tenderness to palpation over the anterior or lateral aspects of the shoulder.  In addition, the swelling in his right forearm and hand is markedly improved.  Actively, he is able to reach his hand to his ipsilateral ear and across to the opposite upper arm.  He is grossly neurovascularly intact to the right upper extremity and hand.  Assessment: Right upper extremity swelling status post reverse right  total shoulder arthroplasty, improved symptomatically.  Plan: The treatment options are reviewed with the patient and his wife, who is at the bedside.  The patient is to continue to keep his hand and forearm elevated is much as possible.  He may begin formal occupational therapy, focusing on passive and active assisted range of motion and strength exercises to the shoulder, elbow, wrist, and hand.  Thank you for asking me to participate in the care of this most delightful man.  I will sign off at this time.  If you have further need of orthopedic input during this hospitalization, please reconsult me.   [name] 05/07/2023, 1:30 PM

## 2023-05-07 NOTE — Progress Notes (Signed)
[facility] [place], [place] 05/07/23  Subjective:   Hospital day # 5   Patient known to our practice from outpatient follow-up of CKD.  Last seen on October 30.  He was started on Farxiga at that time.  Previous autoimmune workup in June 2024 negative ANA, anti-GBM, PLA2R, ANCA.  Creatinine as outpatient was 2.58/GFR 24 from April 05, 2023.  Patient underwent reverse right total shoulder arthroplasty on April 13, 2023. He presented to the emergency room on November 11 for right knee pain and possible hallucinations.  He was admitted from 04/17/2023 and discharged on 04/21/2023 Creatinine at the time of discharge was 2.52/GFR 25 which is at his baseline. Potassium was mildly elevated at 5.5.  Hemoglobin was 8.3.  Losartan was stopped and he was started on hydralazine 25 mg 3 times per day.  He was also prescribed gabapentin 600 mg 3 times per day.  He presented to the emergency room for complaints of hard to arouse, generalized pain.  Triage notes indicate generalized weakness.  Blood pressure was 90/86 upon arrival with heart rate of 134.  He was noted to have elevated ST segment on EKG and thought to have STEMI.  He underwent emergent cardiac catheterization which showed mild nonobstructive coronary disease, mildly reduced LVEF with global hypokinesis and small pericardial effusion.  A question was raised about pericarditis versus myocarditis.  He was started on amiodarone for atrial fibrillation with RVR.    Patient seen laying in bed States he feels well today Remains on 2L [place] Mild lower extremity edema   Objective:  Vital signs in last 24 hours:  Temp:  [97.5 F (36.4 C)-98.2 F (36.8 C)] 98 F (36.7 C) (12/01 1227) Pulse Rate:  [86-106] 99 (12/01 1227) Resp:  [16-25] 16 (12/01 1227) BP: (134-156)/(81-100) 141/100 (12/01 1227) SpO2:  [92 %-98 %] 95 % (12/01 1227) Weight:  [89.4 kg] 89.4 kg (12/01 0500)  Weight change: 2.404 kg Filed Weights   05/05/23  0500 05/06/23 0500 05/07/23 0500  Weight: 87.8 kg 87 kg 89.4 kg    Intake/Output:    Intake/Output Summary (Last 24 hours) at 05/07/2023 1310 Last data filed at 05/07/2023 0900 Gross per 24 hour  Intake 492.67 ml  Output 725 ml  Net -232.33 ml     Physical Exam: General: NAD  HEENT Moist oral mucous membranes  Pulm/lungs Bibasilar wheeze   CVS/Heart Irregular rhythm  Abdomen:  Soft, tympanic, distended  Extremities: Trace peripheral edema  Neurologic: Alert, able to answer simple questions  Skin: Warm, dry  Access: None       Basic Metabolic Panel:  Recent Labs  Lab 05/03/23 0532 05/03/23 1115 05/03/23 2309 05/04/23 0333 05/04/23 1559 05/05/23 0432 05/06/23 0259 05/07/23 0552  NA 133* 133*   < > 131* 131* 134* 131* 137  K 5.5* 5.2*   < > 4.8 4.6 4.5 4.6 4.7  CL 105 103   < > 100 100 99 96* 103  CO2 19* 18*   < > 17* 18* 20* 21* 21*  GLUCOSE 152* 124*   < > 123* 192* 150* 138* 105*  BUN 70* 74*   < > 79* 84* 89* 95* 96*  CREATININE 4.91* 4.88*   < > 5.07* 5.06* 5.01* 4.37* 3.78*  CALCIUM 6.9* 6.9*   < > 6.7* 6.7* 6.7* 6.7* 7.6*  MG 2.0  --   --  2.0 2.0 1.9 2.1  --   PHOS 7.6* 8.1*  --  7.7* 7.5* 6.6*  --   --    < > =  values in this interval not displayed.     CBC: Recent Labs  Lab 05/02/23 1516 05/02/23 2205 05/03/23 0532 05/03/23 1326 05/04/23 0333 05/04/23 1147 05/05/23 0432 05/06/23 0259 05/07/23 0552  WBC 16.3*   < > 16.1*  --  7.1  --  5.8 8.6 8.0  NEUTROABS 12.0*  --   --   --   --   --   --   --   --   HGB 7.0*   < > 7.8*   < > 7.5* 7.7* 7.7* 9.0* 9.0*  HCT 22.8*   < > 24.9*   < > 23.7* 24.7* 23.7* 27.5* 28.6*  MCV 94.2   < > 91.2  --  90.8  --  88.1 89.0 89.7  PLT 363   < > 342  --  271  --  248 286 301   < > = values in this interval not displayed.     No results found for: "HEPBSAG", "HEPBSAB", "HEPBIGM"    Microbiology:  Recent Results (from the past 240 hour(s))  MRSA Next Gen by PCR, Nasal     Status: None   Collection  Time: 05/02/23  5:32 PM   Specimen: Nasal Mucosa; Nasal Swab  Result Value Ref Range Status   MRSA by PCR Next Gen NOT DETECTED NOT DETECTED Final    Comment: (NOTE) The GeneXpert MRSA Assay (FDA approved for NASAL specimens only), is one component of a comprehensive MRSA colonization surveillance program. It is not intended to diagnose MRSA infection nor to guide or monitor treatment for MRSA infections. Test performance is not FDA approved in patients less than 71 years old. Performed at [facility], [address] [place], [place] [address]   SARS Coronavirus 2 by RT PCR (hospital order, performed in [facility] hospital lab) *cepheid single result test* Anterior Nasal Swab     Status: None   Collection Time: 05/02/23  9:04 PM   Specimen: Anterior Nasal Swab  Result Value Ref Range Status   SARS Coronavirus 2 by RT PCR NEGATIVE NEGATIVE Final    Comment: (NOTE) SARS-CoV-2 target nucleic acids are NOT DETECTED.  The SARS-CoV-2 RNA is generally detectable in upper and lower respiratory specimens during the acute phase of infection. The lowest concentration of SARS-CoV-2 viral copies this assay can detect is 250 copies / mL. A negative result does not preclude SARS-CoV-2 infection and should not be used as the sole basis for treatment or other patient management decisions.  A negative result may occur with improper specimen collection / handling, submission of specimen other than nasopharyngeal swab, presence of viral mutation(s) within the areas targeted by this assay, and inadequate number of viral copies (<250 copies / mL). A negative result must be combined with clinical observations, patient history, and epidemiological information.  Fact Sheet for Patients:   RoadLapTop.co.za  Fact Sheet for Healthcare Providers: http://kim-miller.com/  This test is not yet approved or  cleared by the [place] FDA and has been  authorized for detection and/or diagnosis of SARS-CoV-2 by FDA under an Emergency Use Authorization (EUA).  This EUA will remain in effect (meaning this test can be used) for the duration of the COVID-19 declaration under Section 564(b)(1) of the Act, 21 U.S.C. section 360bbb-3(b)(1), unless the authorization is terminated or revoked sooner.  Performed at [facility], [address], [place], [place] [address]   Respiratory (~20 pathogens) panel by PCR     Status: None   Collection Time: 05/02/23  9:04 PM   Specimen: Nasopharyngeal Swab; Respiratory  Result Value Ref Range Status   Adenovirus NOT DETECTED NOT DETECTED Final   Coronavirus 229E NOT DETECTED NOT DETECTED Final    Comment: (NOTE) The Coronavirus on the Respiratory Panel, DOES NOT test for the novel  Coronavirus (2019 nCoV)    Coronavirus HKU1 NOT DETECTED NOT DETECTED Final   Coronavirus NL63 NOT DETECTED NOT DETECTED Final   Coronavirus OC43 NOT DETECTED NOT DETECTED Final   Metapneumovirus NOT DETECTED NOT DETECTED Final   Rhinovirus / Enterovirus NOT DETECTED NOT DETECTED Final   Influenza A NOT DETECTED NOT DETECTED Final   Influenza B NOT DETECTED NOT DETECTED Final   Parainfluenza Virus 1 NOT DETECTED NOT DETECTED Final   Parainfluenza Virus 2 NOT DETECTED NOT DETECTED Final   Parainfluenza Virus 3 NOT DETECTED NOT DETECTED Final   Parainfluenza Virus 4 NOT DETECTED NOT DETECTED Final   Respiratory Syncytial Virus NOT DETECTED NOT DETECTED Final   Bordetella pertussis NOT DETECTED NOT DETECTED Final   Bordetella Parapertussis NOT DETECTED NOT DETECTED Final   Chlamydophila pneumoniae NOT DETECTED NOT DETECTED Final   Mycoplasma pneumoniae NOT DETECTED NOT DETECTED Final    Comment: Performed at [facility], [address], [place], [place] [address]  Culture, blood (Routine X 2) w Reflex to ID Panel     Status: None   Collection Time: 05/02/23 10:05 PM   Specimen: BLOOD  Result Value Ref  Range Status   Specimen Description BLOOD RIGHT WRIST  Final   Special Requests   Final    BOTTLES DRAWN AEROBIC AND ANAEROBIC Blood Culture adequate volume   Culture   Final    NO GROWTH 5 DAYS Performed at [facility], [address], [place], [place] [address]    Report Status 05/07/2023 FINAL  Final  Culture, blood (Routine X 2) w Reflex to ID Panel     Status: None   Collection Time: 05/02/23 10:05 PM   Specimen: BLOOD  Result Value Ref Range Status   Specimen Description BLOOD RIGHT HAND  Final   Special Requests   Final    BOTTLES DRAWN AEROBIC ONLY Blood Culture adequate volume   Culture   Final    NO GROWTH 5 DAYS Performed at [facility], [address] [place], [place] [address]    Report Status 05/07/2023 FINAL  Final    Coagulation Studies: No results for input(s): "LABPROT", "INR" in the last 72 hours.   Urinalysis: No results for input(s): "COLORURINE", "LABSPEC", "PHURINE", "GLUCOSEU", "HGBUR", "BILIRUBINUR", "KETONESUR", "PROTEINUR", "UROBILINOGEN", "NITRITE", "LEUKOCYTESUR" in the last 72 hours.  Invalid input(s): "APPERANCEUR"     Imaging: No results found.   Medications:    heparin 1,900 Units/hr (05/07/23 0945)    Chlorhexidine Gluconate Cloth  6 each Topical Daily   feeding supplement  237 mL Oral TID BM   influenza vaccine adjuvanted  0.5 mL Intramuscular Tomorrow-1000   levothyroxine  25 mcg Oral Q0600   lidocaine  1 patch Transdermal Q24H   metoprolol succinate  50 mg Oral Daily   multivitamin  1 tablet Oral QHS   pantoprazole  40 mg Oral Daily   polyethylene glycol  17 g Oral Daily   predniSONE  20 mg Oral Q breakfast   simvastatin  40 mg Oral Daily   sodium chloride flush  3 mL Intravenous Q12H   acetaminophen, bisacodyl, docusate sodium, ipratropium-albuterol, melatonin, ondansetron (ZOFRAN) IV, mouth rinse, polyethylene glycol, sodium chloride flush  Assessment/ Plan:  82 y.o. 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  Baseline creatinine of 2.5/GFR 25 on April 21, 2023 AKI is likely multifactorial.  Urinalysis from May 02, 2023 shows proteinuria, negative for blood.  0-5 RBCs.  0-5 WBCs.  Low likelihood for acute glomerulonephritis or acute interstitial nephritis. He did present with hypotension which may have led to ATN causing AKI which was subsequently exacerbated by IV contrast exposure from cardiac catheterization.  Renal function continues to improve with adequate urine output. Patient encouraged to maintain oral intake. No acute indication for dialysis.  Continue holding hydralazine and Farxiga.   #Acute pericarditis Autoimmune workup negative from November 2024. Other differential includes viral myocarditis versus other causes. Currently getting treatment with steroids.   Low likelihood of uremic pericarditis in the setting of relatively recent onset of AKI Cardiology following  #Hyperkalemia, corrected Lokelma can be given as needed.  #Proteinuria Patient is noted to have proteinuria even as outpatient. Previous autoimmune workup in June 2024 was negative. Urine protein to creatinine ratio 4.7 g In October 2024. He has known numerous simple and minimally complex renal cysts bilaterally.  CT abdomen from May 02, 2023 is negative for ureteral calculi, hydronephrosis.    LOS: 5 Wendee Beavers 12/1/20241:10 PM  [facility] [place], [place] [phone]  Note: This note was prepared with Dragon dictation. Any transcription errors are unintentional

## 2023-05-07 NOTE — Consult Note (Signed)
PHARMACY - ANTICOAGULATION CONSULT NOTE  Pharmacy Consult for Heparin Indication: atrial fibrillation  Allergies  Allergen Reactions   Meloxicam Nausea And Vomiting   Patient Measurements: Height: 5\' 10"  (177.8 cm) Weight: 89.4 kg (197 lb 1.6 oz) IBW/kg (Calculated) : 73 Heparin Dosing Weight: 81.6 kg  Vital Signs: Temp: 98 F (36.7 C) (12/01 1227) Temp Source: Oral (12/01 1227) BP: 141/100 (12/01 1227) Pulse Rate: 99 (12/01 1227)  Labs: Recent Labs    05/05/23 0432 05/05/23 1713 05/06/23 0259 05/06/23 1229 05/06/23 2150 05/07/23 0552 05/07/23 1504  HGB 7.7*  --  9.0*  --   --  9.0*  --   HCT 23.7*  --  27.5*  --   --  28.6*  --   PLT 248  --  286  --   --  301  --   HEPARINUNFRC  --    < > 0.10*   < > 0.23* 0.39 0.32  CREATININE 5.01*  --  4.37*  --   --  3.78*  --    < > = values in this interval not displayed.    Estimated Creatinine Clearance: 17 mL/min (A) (by C-G formula based on SCr of 3.78 mg/dL (H)).  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 male who presented with chest pain and SOB. PMH significant for HFpEF, HTN, HDL and CKD stage IIIb. EKG showed ST elevation. Cath showed mild nonobstructive CAD. Patient was newly diagnosed with atrial  fibrillation with RVR. CHA2DS2-VASc score 6 (HTN, HF, age, HTN).   05/07/2023  - Hgb improved to 9 today after transfusing 1 unit PRBC on 11/29 - No issues with infusion reported, no signs/symptoms of bleeding noted.  Date Time HL Rate/Comment 11/29 1713 <0.10 SUBtherapeutic 11/30 0259 0.10 Subtherapeutic 11/30 1229 0.20 Subtherapeutic 12/01 0552 0.39 Therapeutic x 1@1750un /hr 12/01 1504 0.32 Therapeutic x 2 @1900un /hr ~ 5 hours   Goal of Therapy:  Heparin level 0.3-0.7 units/ml Monitor platelets by anticoagulation protocol: Yes   Plan:  Heparin level therapeutic, but decreasing. Rate was increased ~ 5 hours prior to level Continue heparin infusion at current rate of 1900 units/hr Check another confirmatory anti-Xa level in 8 hours, prior to switching to daily monitoring, to ensure patient does not go subtherapeutic  Continue to monitor H&H and platelets daily    Merryl Hacker, PharmD Clinical Pharmacist 05/07/2023 3:51 PM

## 2023-05-07 NOTE — Consult Note (Signed)
PHARMACY - ANTICOAGULATION CONSULT NOTE  Pharmacy Consult for Heparin Indication: atrial fibrillation  Allergies  Allergen Reactions   Meloxicam Nausea And Vomiting   Patient Measurements: Height: 5\' 10"  (177.8 cm) Weight: 89.4 kg (197 lb 1.6 oz) IBW/kg (Calculated) : 73 Heparin Dosing Weight: 81.6 kg  Vital Signs: Temp: 97.8 F (36.6 C) (12/01 0540) Temp Source: Oral (12/01 0540) BP: 137/81 (12/01 0540) Pulse Rate: 86 (12/01 0540)  Labs: Recent Labs    05/05/23 0432 05/05/23 1713 05/06/23 0259 05/06/23 1229 05/06/23 2150 05/07/23 0552  HGB 7.7*  --  9.0*  --   --  9.0*  HCT 23.7*  --  27.5*  --   --  28.6*  PLT 248  --  286  --   --  301  HEPARINUNFRC  --    < > 0.10* 0.20* 0.23* 0.39  CREATININE 5.01*  --  4.37*  --   --  3.78*   < > = values in this interval not displayed.    Estimated Creatinine Clearance: 17 mL/min (A) (by C-G formula based on SCr of 3.78 mg/dL (H)).  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 male who presented with chest pain and SOB. PMH significant for HFpEF, HTN, HDL and CKD stage IIIb. EKG showed ST elevation. Cath showed mild nonobstructive CAD. Patient was newly diagnosed with atrial fibrillation with RVR. CHA2DS2-VASc score 6  (HTN, HF, age, HTN).   05/07/2023  - Hgb improved to 9 today after transfusing 1 unit PRBC on 11/29 - No issues with infusion reported, no signs/symptoms of bleeding noted.  Date Time HL Rate/Comment 11/29 1713 <0.10 SUBtherapeutic 11/30 0259 0.10 Subtherapeutic 11/30 1229 0.20 Subtherapeutic 12/01 0552 0.39 Therapeutic x 1@1750un /hr   Goal of Therapy:  Heparin level 0.3-0.7 units/ml Monitor platelets by anticoagulation protocol: Yes   Plan:  Continue heparin infusion at 1750 units/hour Check confirmatory anti-Xa level in 8 hours and daily while on heparin Continue to monitor H&H and platelets Daily CBC    Bettey Costa, PharmD Clinical Pharmacist 05/07/2023 7:15 AM

## 2023-05-07 NOTE — Progress Notes (Signed)
Patient Name: [name] Date of Encounter: 05/07/2023 [facility] Cardiologist: Mariah Milling  Interval Summary  .    Limited echo pending. No chest pain, dyspnea, or palpitations. Transferred out of the ICU. WESR improving 109-74. CRP pending. Renal function continues to improve. Hgb stable. BP stable. No chest pain, dyspnea, or palpitations. Ambulated in the room without issues.   Vital Signs .    Vitals:   05/06/23 2100 05/07/23 0500 05/07/23 0535 05/07/23 0540  BP:   137/81 137/81  Pulse:   86 86  Resp: (!) 21  (!) 21 20  Temp:   97.8 F (36.6 C) 97.8 F (36.6 C)  TempSrc:   Oral Oral  SpO2:   96% 96%  Weight:  89.4 kg    Height:        Intake/Output Summary (Last 24 hours) at 05/07/2023 0737 Last data filed at 05/06/2023 2300 Gross per 24 hour  Intake 492.67 ml  Output 1325 ml  Net -832.33 ml      05/07/2023    5:00 AM 05/06/2023    5:00 AM 05/05/2023    5:00 AM  Last 3 Weights  Weight (lbs) 197 lb 1.6 oz 191 lb 12.8 oz 193 lb 9 oz  Weight (kg) 89.404 kg 87 kg 87.8 kg      Telemetry/ECG    Atrial fibrillation with heart rates predominantly in the 90s to low 100s with brief episodes into the 120s bpm - Personally Reviewed  Physical Exam .   GEN: No acute distress.   Neck: No JVD Cardiac: Irregularly irregular without murmurs, rubs, or gallops. Respiratory: Mildly diminished breath sounds throughout without wheezes or crackles. GI: Soft, nontender, non-distended  MS: No right arm swelling.  No lower extremity edema.  Assessment & Plan .     Acute pericarditis: Chest and abdominal pain have resolved with addition of prednisone.  EKG 11/28 showed persistent widespread ST elevations, though less pronounced than on prior tracings upon presentation.  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  ESR and CRP noted to be markedly elevated on admission.  Etiology for pericarditis remains uncertain.  Considerations include idiopathic,  drug-induced with recent initiation of hydralazine, or uremic, though nephrology thinks relatively sudden onset of AKI would be less likely to cause acute pericarditis.  ANA negative. -Continue prednisone 20 mg daily until inflammatory biomarkers have normalized, WESR improving.  -Colchicine 0.3 mg given x 1 on 11/26.  Would not redose at this time in the setting of acute kidney injury superimposed on chronic kidney disease. -Unable to use NSAIDs with renal dysfunction.  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 -Stable.  -Monitor on heparin gtt.   Atrial fibrillation with rapid ventricular response: New diagnosis for the patient.  May have been precipitated by acute pericarditis.  Patient is high risk for cardioembolic events in the setting of his acute inflammatory process and other comorbidities with a CHA2DS2-VASc score of at least 6.  He is also at high risk for bleeding complications.  Has been started on heparin gtt while admitted, tolerating at this point. -Uncertain if he would be a long-term OAC candidate, consider Watchman evaluation in the outpatient setting.  -Obtain limited echo to evaluate for enlargement of pericardial effusion on heparin gtt.  -Toprol XL has been titrated to 50 mg daily for added ventricular rate control.   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  EF may  be somewhat underestimated in the setting of atrial fibrillation.  Mr. Root appears fairly euvolemic. -Maintain net even fluid balance. -Toprol XL as above.  -Relative hypotension and acute on CKD preclude escalation of GDMT at this time.   Acute kidney injury superimposed on chronic kidney disease: -Creatinine seems to have plateaued around 5 (baseline has been 2-2.5, noted to be 4.5 on admission).  An element of CIN could be contributing to further rise in creatinine, though only 25 mL of contrast were  used for diagnostic catheterization.   -Renal function improving.  -Continue supportive care.   -Appreciate nephrology recommendations. -Target net even fluid balance.      For questions or updates, please contact [facility] Please consult [email or web address] for contact info under [facility].    Signed, Eula Listen, PA-C

## 2023-05-07 NOTE — Progress Notes (Signed)
Progress Note   Patient: [name] WUJ:[id number] DOB: 1941-04-29 DOA: 05/02/2023     5 DOS: the patient was seen and examined on 05/07/2023   Brief hospital course: [name] is a 82 y.o. male with medical history significant of chronic HFpEF, hypertension, hyperlipidemia, myelofibrosis, CKD stage IIIb, anemia of chronic disease, who presented initially to [facility] ED with chest pain and shortness of breath.  Troponin 41, EKG showed ST elevation.  Patient was taken to the Cath Lab, did not show any occlusion in coronary arteries.  Condition consistent with acute pericarditis.  Treated with colchicine, then changed to prednisone. Also has significant worsening renal function at the time of admission, associated with hyperkalemia.  Patient was given IV fluids, Lokelma.  Followed by nephrology. 11/28.  Patient developed atrial fibrillation, seen by cardiology, placed on heparin drip. 11/29.  Transfuse 1 unit of PRBC.   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 Chronic systolic congestive heart failure. STEMI ruled out.  Patient has been seen by cardiology, cardiac cath did not show significant coronary artery disease.  Patient has acute pericarditis.  She is treated with a lower dose of prednisone to 20 mg daily. Echocardiogram showed ejection fraction 40 to 45%, mild pulm  hypertension.  Did not show any pericardial effusion. Patient is followed by cardiology.   New onset atrial fibrillation with rapid ventricular response. Patient followed by cardiology, started on heparin drip. Heart rate better controlled, I will increase dose of metoprolol to 50 mg daily.   Myelodysplastic syndrome. Acute on chronic anemia. Received 1 unit PRBC on 11/29, hemoglobin is better.   Acute hypoxemic respiratory failure. Patient had a signal short of breath, potentially from pericarditis.  Oxygen saturation dropped down to 87%, patient on 2 L oxygen.  Patient is also followed by nephrology and cardiology for volume status.  Currently does not have volume overload.   Patient condition improved, off oxygen  with good saturation.   SIRS. Likely hypovolemic shock. Patient had a significant hypotension, resulting in severe acute kidney injury.  He also has a mild elevation of procalcitonin level, but could not identify any source of infection. He was placed on broad-spectrum antibiotics with vancomycin and cefepime.  Blood culture has no growth.  Procalcitonin level still elevated, I could not rule out a source of pneumonia.  Repeat chest x-ray still did not see a pneumonia, antibiotics discontinued.  Procalcitonin level now at 0.8.   Acute renal failure on chronic kidney disease stage IIIb secondary to ATN. Hyperkalemia secondary to acute renal failure. Metabolic acidosis secondary to acute renal failure. Hyponatremia. Patient also received IV contrast, but renal function already worsened at the time of admission.  Worsening renal function appears to be secondary to  hypotension.  Patient was on IV pressor, was able to wean off 11/28. Blood pressure is more stable now.   Patient started have polyuria, renal function improving.  Will continue monitor renal function.  Patient was able to urinate after removal of Foley catheter.    Anasarca. Right arm lymphedema. CT scan  abdomen/pelvis showed evidence of anasarca, this could be secondary to renal failure. Right arm lymphedema, no evidence of infection.  Seen by orthopedics. Condition improving with large amount of urine output.      Subjective:  Patient feels much better today, no short of breath.  Able to stand up with physical therapy.  Physical Exam: Vitals:   05/07/23 0500 05/07/23 0535 05/07/23 0540 05/07/23 0801  BP:  137/81 137/81 135/87  Pulse:  86 86 (!) 104  Resp:  (!) 21 20 16   Temp:  97.8 F (36.6 C) 97.8 F (36.6 C) (!) 97.5 F (36.4 C)  TempSrc:  Oral Oral   SpO2:  96% 96% 98%  Weight: 89.4 kg     Height:       General exam: Appears calm and comfortable  Respiratory system: Clear to auscultation. Respiratory effort normal. Cardiovascular system: Irregular. No JVD, murmurs, rubs, gallops or clicks. No pedal edema. Gastrointestinal system: Abdomen is nondistended, soft and nontender. No organomegaly or masses felt. Normal bowel sounds heard. Central nervous system: Alert and oriented. No focal neurological deficits. Extremities: Right arm edema improved. Skin: No rashes, lesions or ulcers Psychiatry: Judgement and insight appear normal. Mood & affect appropriate.    Data Reviewed:  Lab results reviewed.  Family Communication: None  Disposition: Status is: Inpatient Remains inpatient appropriate because: Severity of disease,     Time spent: 35 minutes  Author: Marrion Coy, MD 05/07/2023 11:51 AM  For on call review [email or web address].

## 2023-05-07 NOTE — Plan of Care (Signed)
  Problem: Education: Goal: Knowledge of General Education information will improve Description: Including pain rating scale, medication(s)/side effects and non-pharmacologic comfort measures Outcome: Progressing   Problem: Health Behavior/Discharge Planning: Goal: Ability to manage health-related needs will improve Outcome: Progressing   Problem: Clinical Measurements: Goal: Ability to maintain clinical measurements within normal limits will improve Outcome: Progressing Goal: Will remain free from infection Outcome: Progressing Goal: Diagnostic test results will improve Outcome: Progressing Goal: Respiratory complications will improve Outcome: Progressing Goal: Cardiovascular complication will be avoided Outcome: Progressing   Problem: Activity: Goal: Risk for activity intolerance will decrease Outcome: Progressing   Problem: Nutrition: Goal: Adequate nutrition will be maintained Outcome: Progressing   Problem: Coping: Goal: Level of anxiety will decrease Outcome: Progressing   Problem: Elimination: Goal: Will not experience complications related to bowel motility Outcome: Progressing Goal: Will not experience complications related to urinary retention Outcome: Progressing   Problem: Pain Management: Goal: General experience of comfort will improve Outcome: Progressing   Problem: Safety: Goal: Ability to remain free from injury will improve Outcome: Progressing   Problem: Skin Integrity: Goal: Risk for impaired skin integrity will decrease Outcome: Progressing   Problem: Education: Goal: Understanding of CV disease, CV risk reduction, and recovery process will improve Outcome: Progressing   Problem: Activity: Goal: Ability to return to baseline activity level will improve Outcome: Progressing   Problem: Cardiovascular: Goal: Ability to achieve and maintain adequate cardiovascular perfusion will improve Outcome: Progressing Goal: Vascular access site(s)  Level 0-1 will be maintained Outcome: Progressing   Problem: Health Behavior/Discharge Planning: Goal: Ability to safely manage health-related needs after discharge will improve Outcome: Progressing

## 2023-05-08 ENCOUNTER — Telehealth (HOSPITAL_COMMUNITY): Payer: Self-pay | Admitting: Pharmacy Technician

## 2023-05-08 ENCOUNTER — Other Ambulatory Visit (HOSPITAL_COMMUNITY): Payer: Self-pay

## 2023-05-08 DIAGNOSIS — I5021 Acute systolic (congestive) heart failure: Secondary | ICD-10-CM | POA: Diagnosis not present

## 2023-05-08 DIAGNOSIS — I309 Acute pericarditis, unspecified: Secondary | ICD-10-CM | POA: Diagnosis not present

## 2023-05-08 DIAGNOSIS — I4891 Unspecified atrial fibrillation: Secondary | ICD-10-CM | POA: Diagnosis not present

## 2023-05-08 DIAGNOSIS — N171 Acute kidney failure with acute cortical necrosis: Secondary | ICD-10-CM | POA: Diagnosis not present

## 2023-05-08 LAB — HEPARIN LEVEL (UNFRACTIONATED)
Heparin Unfractionated: 0.28 [IU]/mL — ABNORMAL LOW (ref 0.30–0.70)
Heparin Unfractionated: 0.44 [IU]/mL (ref 0.30–0.70)

## 2023-05-08 LAB — CBC
HCT: 30.2 % — ABNORMAL LOW (ref 39.0–52.0)
Hemoglobin: 9.5 g/dL — ABNORMAL LOW (ref 13.0–17.0)
MCH: 28.9 pg (ref 26.0–34.0)
MCHC: 31.5 g/dL (ref 30.0–36.0)
MCV: 91.8 fL (ref 80.0–100.0)
Platelets: 345 10*3/uL (ref 150–400)
RBC: 3.29 MIL/uL — ABNORMAL LOW (ref 4.22–5.81)
RDW: 18.3 % — ABNORMAL HIGH (ref 11.5–15.5)
WBC: 10.1 10*3/uL (ref 4.0–10.5)
nRBC: 0.6 % — ABNORMAL HIGH (ref 0.0–0.2)

## 2023-05-08 LAB — BASIC METABOLIC PANEL
Anion gap: 11 (ref 5–15)
BUN: 93 mg/dL — ABNORMAL HIGH (ref 8–23)
CO2: 21 mmol/L — ABNORMAL LOW (ref 22–32)
Calcium: 7.8 mg/dL — ABNORMAL LOW (ref 8.9–10.3)
Chloride: 107 mmol/L (ref 98–111)
Creatinine, Ser: 3.01 mg/dL — ABNORMAL HIGH (ref 0.61–1.24)
GFR, Estimated: 20 mL/min — ABNORMAL LOW (ref >60)
Glucose, Bld: 107 mg/dL — ABNORMAL HIGH (ref 70–99)
Potassium: 4.7 mmol/L (ref 3.5–5.1)
Sodium: 139 mmol/L (ref 135–145)

## 2023-05-08 LAB — MAGNESIUM: Magnesium: 1.9 mg/dL (ref 1.7–2.4)

## 2023-05-08 MED ORDER — HEPARIN BOLUS VIA INFUSION
1200.0000 [IU] | Freq: Once | INTRAVENOUS | Status: AC
  Administered 2023-05-08: 1200 [IU] via INTRAVENOUS
  Filled 2023-05-08: qty 1200

## 2023-05-08 MED ORDER — APIXABAN 2.5 MG PO TABS
2.5000 mg | ORAL_TABLET | Freq: Two times a day (BID) | ORAL | Status: DC
  Administered 2023-05-08 – 2023-05-09 (×2): 2.5 mg via ORAL
  Filled 2023-05-08 (×2): qty 1

## 2023-05-08 MED ORDER — METOPROLOL SUCCINATE ER 100 MG PO TB24
100.0000 mg | ORAL_TABLET | Freq: Two times a day (BID) | ORAL | Status: DC
  Administered 2023-05-08 – 2023-05-09 (×2): 100 mg via ORAL
  Filled 2023-05-08 (×2): qty 1

## 2023-05-08 NOTE — Care Management Important Message (Signed)
Important Message  Patient Details  Name: [name] MRN: [id number] Date of Birth: 06-11-1940   Important Message Given:  Yes - Medicare IM     [name] 05/08/2023, 3:36 PM

## 2023-05-08 NOTE — Telephone Encounter (Signed)
Patient Product/process development scientist completed.    The patient is insured through [facility]. Patient has Medicare and is not eligible for a copay card, but may be able to apply for patient assistance, if available.    Ran test claim for Eliquis 5 mg and the current 30 day co-pay is $47.00.  Ran test claim for Xarelto 20 mg and the current 30 day co-pay is $47.00.  This test claim was processed through [facility]- copay amounts may vary at other pharmacies due to pharmacy/plan contracts, or as the patient moves through the different stages of their insurance plan.     Roland Earl, CPHT Pharmacy Technician III Certified Patient Advocate [facility] Patient Advocate Team Direct Number: [phone]  Fax: [phone]

## 2023-05-08 NOTE — Plan of Care (Signed)
  Problem: Education: Goal: Knowledge of General Education information will improve Description: Including pain rating scale, medication(s)/side effects and non-pharmacologic comfort measures Outcome: Progressing   Problem: Health Behavior/Discharge Planning: Goal: Ability to manage health-related needs will improve Outcome: Progressing   Problem: Clinical Measurements: Goal: Ability to maintain clinical measurements within normal limits will improve Outcome: Progressing Goal: Will remain free from infection Outcome: Progressing Goal: Diagnostic test results will improve Outcome: Progressing Goal: Respiratory complications will improve Outcome: Progressing Goal: Cardiovascular complication will be avoided Outcome: Progressing   Problem: Activity: Goal: Risk for activity intolerance will decrease Outcome: Progressing   Problem: Nutrition: Goal: Adequate nutrition will be maintained Outcome: Progressing   Problem: Coping: Goal: Level of anxiety will decrease Outcome: Progressing   Problem: Elimination: Goal: Will not experience complications related to bowel motility Outcome: Progressing Goal: Will not experience complications related to urinary retention Outcome: Progressing   Problem: Pain Management: Goal: General experience of comfort will improve Outcome: Progressing   Problem: Safety: Goal: Ability to remain free from injury will improve Outcome: Progressing   Problem: Skin Integrity: Goal: Risk for impaired skin integrity will decrease Outcome: Progressing   Problem: Education: Goal: Understanding of CV disease, CV risk reduction, and recovery process will improve Outcome: Progressing   Problem: Activity: Goal: Ability to return to baseline activity level will improve Outcome: Progressing   Problem: Cardiovascular: Goal: Ability to achieve and maintain adequate cardiovascular perfusion will improve Outcome: Progressing Goal: Vascular access site(s)  Level 0-1 will be maintained Outcome: Progressing   Problem: Health Behavior/Discharge Planning: Goal: Ability to safely manage health-related needs after discharge will improve Outcome: Progressing

## 2023-05-08 NOTE — Progress Notes (Signed)
[facility] [place], [place] 05/08/23  Subjective:   Hospital day # 6   Patient known to our practice from outpatient follow-up of CKD.  Last seen on October 30.  He was started on Farxiga at that time.  Previous autoimmune workup in June 2024 negative ANA, anti-GBM, PLA2R, ANCA.  Creatinine as outpatient was 2.58/GFR 24 from April 05, 2023.  Patient underwent reverse right total shoulder arthroplasty on April 13, 2023. He presented to the emergency room on November 11 for right knee pain and possible hallucinations.  He was admitted from 04/17/2023 and discharged on 04/21/2023 Creatinine at the time of discharge was 2.52/GFR 25 which is at his baseline. Potassium was mildly elevated at 5.5.  Hemoglobin was 8.3.  Losartan was stopped and he was started on hydralazine 25 mg 3 times per day.  He was also prescribed gabapentin 600 mg 3 times per day.  He presented to the emergency room for complaints of hard to arouse, generalized pain.  Triage notes indicate generalized weakness.  Blood pressure was 90/86 upon arrival with heart rate of 134.  He was noted to have elevated ST segment on EKG and thought to have STEMI.  He underwent emergent cardiac catheterization which showed mild nonobstructive coronary disease, mildly reduced LVEF with global hypokinesis and small pericardial effusion.  A question was raised about pericarditis versus myocarditis.  He was started on amiodarone for atrial fibrillation with RVR.    Update Patient seen sitting up in chair Wife at bedside Appetite remains appropriate Denies shortness of breath  Creatinine 3.01 from 3.78. Urine output 1 L  Objective:  Vital signs in last 24 hours:  Temp:  [97.5 F (36.4 C)-97.8 F (36.6 C)] 97.6 F (36.4 C) (12/02 1211) Pulse Rate:  [63-113] 100 (12/02 1211) Resp:  [16-27] 27 (12/02 1211) BP: (133-155)/(72-87) 152/84 (12/02 1211) SpO2:  [95 %-97 %] 96 % (12/02 1211)  Weight change:  Filed Weights    05/05/23 0500 05/06/23 0500 05/07/23 0500  Weight: 87.8 kg 87 kg 89.4 kg    Intake/Output:    Intake/Output Summary (Last 24 hours) at 05/08/2023 1307 Last data filed at 05/08/2023 0900 Gross per 24 hour  Intake 909.69 ml  Output 875 ml  Net 34.69 ml     Physical Exam: General: NAD  HEENT Moist oral mucous membranes  Pulm/lungs Bibasilar wheeze   CVS/Heart Irregular rhythm  Abdomen:  Soft, tympanic, distended  Extremities: Trace peripheral edema  Neurologic: Alert, able to answer simple questions  Skin: Warm, dry  Access: None       Basic Metabolic Panel:  Recent Labs  Lab 05/03/23 0532 05/03/23 1115 05/03/23 2309 05/04/23 0333 05/04/23 1559 05/05/23 0432 05/06/23 0259 05/07/23 0552 05/08/23 0456  NA 133* 133*   < > 131* 131* 134* 131* 137 139  K 5.5* 5.2*   < > 4.8 4.6 4.5 4.6 4.7 4.7  CL 105 103   < > 100 100 99 96* 103 107  CO2 19* 18*   < > 17* 18* 20* 21* 21* 21*  GLUCOSE 152* 124*   < > 123* 192* 150* 138* 105* 107*  BUN 70* 74*   < > 79* 84* 89* 95* 96* 93*  CREATININE 4.91* 4.88*   < > 5.07* 5.06* 5.01* 4.37* 3.78* 3.01*  CALCIUM 6.9* 6.9*   < > 6.7* 6.7* 6.7* 6.7* 7.6* 7.8*  MG 2.0  --   --  2.0 2.0 1.9 2.1  --  1.9  PHOS 7.6*  8.1*  --  7.7* 7.5* 6.6*  --   --   --    < > = values in this interval not displayed.     CBC: Recent Labs  Lab 05/02/23 1516 05/02/23 2205 05/04/23 0333 05/04/23 1147 05/05/23 0432 05/06/23 0259 05/07/23 0552 05/08/23 0456  WBC 16.3*   < > 7.1  --  5.8 8.6 8.0 10.1  NEUTROABS 12.0*  --   --   --   --   --   --   --   HGB 7.0*   < > 7.5* 7.7* 7.7* 9.0* 9.0* 9.5*  HCT 22.8*   < > 23.7* 24.7* 23.7* 27.5* 28.6* 30.2*  MCV 94.2   < > 90.8  --  88.1 89.0 89.7 91.8  PLT 363   < > 271  --  248 286 301 345   < > = values in this interval not displayed.     No results found for: "HEPBSAG", "HEPBSAB", "HEPBIGM"    Microbiology:  Recent Results (from the past 240 hour(s))  MRSA Next Gen by PCR, Nasal     Status:  None   Collection Time: 05/02/23  5:32 PM   Specimen: Nasal Mucosa; Nasal Swab  Result Value Ref Range Status   MRSA by PCR Next Gen NOT DETECTED NOT DETECTED Final    Comment: (NOTE) The GeneXpert MRSA Assay (FDA approved for NASAL specimens only), is one component of a comprehensive MRSA colonization surveillance program. It is not intended to diagnose MRSA infection nor to guide or monitor treatment for MRSA infections. Test performance is not FDA approved in patients less than 47 years old. Performed at [facility], [address] [place], [place] [address]   SARS Coronavirus 2 by RT PCR (hospital order, performed in [facility] hospital lab) *cepheid single result test* Anterior Nasal Swab     Status: None   Collection Time: 05/02/23  9:04 PM   Specimen: Anterior Nasal Swab  Result Value Ref Range Status   SARS Coronavirus 2 by RT PCR NEGATIVE NEGATIVE Final    Comment: (NOTE) SARS-CoV-2 target nucleic acids are NOT DETECTED.  The SARS-CoV-2 RNA is generally detectable in upper and lower respiratory specimens during the acute phase of infection. The lowest concentration of SARS-CoV-2 viral copies this assay can detect is 250 copies / mL. A negative result does not preclude SARS-CoV-2 infection and should not be used as the sole basis for treatment or other patient management decisions.  A negative result may occur with improper specimen collection / handling, submission of specimen other than nasopharyngeal swab, presence of viral mutation(s) within the areas targeted by this assay, and inadequate number of viral copies (<250 copies / mL). A negative result must be combined with clinical observations, patient history, and epidemiological information.  Fact Sheet for Patients:   RoadLapTop.co.za  Fact Sheet for Healthcare Providers: http://kim-miller.com/  This test is not yet approved or  cleared by the [place]  FDA and has been authorized for detection and/or diagnosis of SARS-CoV-2 by FDA under an Emergency Use Authorization (EUA).  This EUA will remain in effect (meaning this test can be used) for the duration of the COVID-19 declaration under Section 564(b)(1) of the Act, 21 U.S.C. section 360bbb-3(b)(1), unless the authorization is terminated or revoked sooner.  Performed at [facility], [address] [place], [place] [address]   Respiratory (~20 pathogens) panel by PCR     Status: None   Collection Time: 05/02/23  9:04 PM   Specimen: Nasopharyngeal Swab; Respiratory  Result Value Ref Range Status   Adenovirus NOT DETECTED NOT DETECTED Final   Coronavirus 229E NOT DETECTED NOT DETECTED Final    Comment: (NOTE) The Coronavirus on the Respiratory Panel, DOES NOT test for the novel  Coronavirus (2019 nCoV)    Coronavirus HKU1 NOT DETECTED NOT DETECTED Final   Coronavirus NL63 NOT DETECTED NOT DETECTED Final   Coronavirus OC43 NOT DETECTED NOT DETECTED Final   Metapneumovirus NOT DETECTED NOT DETECTED Final   Rhinovirus / Enterovirus NOT DETECTED NOT DETECTED Final   Influenza A NOT DETECTED NOT DETECTED Final   Influenza B NOT DETECTED NOT DETECTED Final   Parainfluenza Virus 1 NOT DETECTED NOT DETECTED Final   Parainfluenza Virus 2 NOT DETECTED NOT DETECTED Final   Parainfluenza Virus 3 NOT DETECTED NOT DETECTED Final   Parainfluenza Virus 4 NOT DETECTED NOT DETECTED Final   Respiratory Syncytial Virus NOT DETECTED NOT DETECTED Final   Bordetella pertussis NOT DETECTED NOT DETECTED Final   Bordetella Parapertussis NOT DETECTED NOT DETECTED Final   Chlamydophila pneumoniae NOT DETECTED NOT DETECTED Final   Mycoplasma pneumoniae NOT DETECTED NOT DETECTED Final    Comment: Performed at [facility], [address] [place], [place] [address]  Culture, blood (Routine X 2) w Reflex to ID Panel     Status: None   Collection Time: 05/02/23 10:05 PM   Specimen: BLOOD   Result Value Ref Range Status   Specimen Description BLOOD RIGHT WRIST  Final   Special Requests   Final    BOTTLES DRAWN AEROBIC AND ANAEROBIC Blood Culture adequate volume   Culture   Final    NO GROWTH 5 DAYS Performed at [facility], [address] [place], [place] [address]    Report Status 05/07/2023 FINAL  Final  Culture, blood (Routine X 2) w Reflex to ID Panel     Status: None   Collection Time: 05/02/23 10:05 PM   Specimen: BLOOD  Result Value Ref Range Status   Specimen Description BLOOD RIGHT HAND  Final   Special Requests   Final    BOTTLES DRAWN AEROBIC ONLY Blood Culture adequate volume   Culture   Final    NO GROWTH 5 DAYS Performed at [facility], [address], [place], [place] [address]    Report Status 05/07/2023 FINAL  Final    Coagulation Studies: No results for input(s): "LABPROT", "INR" in the last 72 hours.   Urinalysis: No results for input(s): "COLORURINE", "LABSPEC", "PHURINE", "GLUCOSEU", "HGBUR", "BILIRUBINUR", "KETONESUR", "PROTEINUR", "UROBILINOGEN", "NITRITE", "LEUKOCYTESUR" in the last 72 hours.  Invalid input(s): "APPERANCEUR"     Imaging: ECHOCARDIOGRAM LIMITED  Result Date: 05/07/2023    ECHOCARDIOGRAM LIMITED REPORT   Patient Name:   SR. Coby A Rone Date of Exam: 05/07/2023 Medical Rec #:  [id number]         Height:       70.0 in Accession #:    [id number]        Weight:       197.1 lb Date of Birth:  07-10-1940         BSA:          2.074 m Patient Age:    82 years          BP:           137/81 mmHg Patient Gender: M  HR:           102 bpm. Exam Location:  [facility] Procedure: 2D Echo Indications:     Pericardial effusion I31.3  History:         Patient has prior history of Echocardiogram examinations.  Sonographer:     Elwin Sleight RDCS Referring Phys:  [id number] Raymon Mutton DUNN Diagnosing Phys: Chilton Si MD  Sonographer Comments: Image acquisition challenging due to respiratory motion. IMPRESSIONS  1.  Left ventricular ejection fraction, by estimation, is 45 to 50%. The left ventricle has mildly decreased function. The left ventricle demonstrates global hypokinesis. Left ventricular diastolic parameters are indeterminate.  2. Right ventricular systolic function is normal. The right ventricular size is normal. There is mildly elevated pulmonary artery systolic pressure.  3. Left atrial size was severely dilated.  4. Right atrial size was moderately dilated.  5. Small pericardial effusion that is slightly larger than the echo 05/03/23. a small pericardial effusion is present. There is no evidence of cardiac tamponade.  6. The mitral valve is normal in structure. Mild mitral valve regurgitation. No evidence of mitral stenosis.  7. The aortic valve is tricuspid. There is mild calcification of the aortic valve. Aortic valve regurgitation is not visualized. No aortic stenosis is present.  8. Aortic dilatation noted. There is mild dilatation of the ascending aorta, measuring 40 mm.  9. The inferior vena cava is dilated in size with >50% respiratory variability, suggesting right atrial pressure of 8 mmHg. FINDINGS  Left Ventricle: Left ventricular ejection fraction, by estimation, is 45 to 50%. The left ventricle has mildly decreased function. The left ventricle demonstrates global hypokinesis. The left ventricular internal cavity size was normal in size. There is  no left ventricular hypertrophy. Left ventricular diastolic parameters are indeterminate. Left ventricular diastolic function could not be evaluated due to atrial fibrillation. Right Ventricle: The right ventricular size is normal. No increase in right ventricular wall thickness. Right ventricular systolic function is normal. There is mildly elevated pulmonary artery systolic pressure. The tricuspid regurgitant velocity is 2.84  m/s, and with an assumed right atrial pressure of 8 mmHg, the estimated right ventricular systolic pressure is 40.3 mmHg. Left Atrium:  Left atrial size was severely dilated. Right Atrium: Right atrial size was moderately dilated. Pericardium: Small pericardial effusion that is slightly larger than the echo 05/03/23. A small pericardial effusion is present. There is no evidence of cardiac tamponade. Mitral Valve: The mitral valve is normal in structure. Mild mitral valve regurgitation. No evidence of mitral valve stenosis. Tricuspid Valve: The tricuspid valve is normal in structure. Tricuspid valve regurgitation is trivial. No evidence of tricuspid stenosis. Aortic Valve: The aortic valve is tricuspid. There is mild calcification of the aortic valve. Aortic valve regurgitation is not visualized. No aortic stenosis is present. Aortic valve peak gradient measures 14.9 mmHg. Pulmonic Valve: The pulmonic valve was normal in structure. Pulmonic valve regurgitation is not visualized. No evidence of pulmonic stenosis. Aorta: Aortic dilatation noted. There is mild dilatation of the ascending aorta, measuring 40 mm. Venous: The inferior vena cava is dilated in size with greater than 50% respiratory variability, suggesting right atrial pressure of 8 mmHg. IAS/Shunts: No atrial level shunt detected by color flow Doppler. LEFT VENTRICLE PLAX 2D LVIDd:         4.80 cm      Diastology LVIDs:         3.60 cm      LV e' lateral: 12.00 cm/s LV PW:  1.30 cm LV IVS:        1.30 cm LVOT diam:     2.30 cm LV SV:         61 LV SV Index:   30 LVOT Area:     4.15 cm  LV Volumes (MOD) LV vol d, MOD A2C: 108.0 ml LV vol d, MOD A4C: 121.0 ml LV vol s, MOD A2C: 60.3 ml LV vol s, MOD A4C: 63.7 ml LV SV MOD A2C:     47.7 ml LV SV MOD A4C:     121.0 ml LV SV MOD BP:      52.9 ml RIGHT VENTRICLE RV Basal diam:  4.00 cm RV S prime:     18.00 cm/s TAPSE (M-mode): 2.1 cm LEFT ATRIUM             Index        RIGHT ATRIUM           Index LA diam:        4.20 cm 2.02 cm/m   RA Area:     25.40 cm LA Vol (A2C):   88.6 ml 42.71 ml/m  RA Volume:   75.00 ml  36.15 ml/m LA Vol  (A4C):   75.3 ml 36.30 ml/m LA Biplane Vol: 81.9 ml 39.48 ml/m  AORTIC VALVE                 PULMONIC VALVE AV Area (Vmax): 2.15 cm     PV Vmax:        0.94 m/s AV Vmax:        193.00 cm/s  PV Peak grad:   3.5 mmHg AV Peak Grad:   14.9 mmHg    RVOT Peak grad: 2 mmHg LVOT Vmax:      99.90 cm/s LVOT Vmean:     56.100 cm/s LVOT VTI:       0.148 m  AORTA Ao Root diam: 3.90 cm Ao Asc diam:  4.00 cm TRICUSPID VALVE TR Peak grad:   32.3 mmHg TR Vmax:        284.00 cm/s  SHUNTS Systemic VTI:  0.15 m Systemic Diam: 2.30 cm Chilton Si MD Electronically signed by Chilton Si MD Signature Date/Time: 05/07/2023/4:15:20 PM    Final (Updated)      Medications:    heparin 2,050 Units/hr (05/08/23 1142)    Chlorhexidine Gluconate Cloth  6 each Topical Daily   feeding supplement  237 mL Oral TID BM   levothyroxine  25 mcg Oral Q0600   lidocaine  1 patch Transdermal Q24H   metoprolol succinate  100 mg Oral BID   multivitamin  1 tablet Oral QHS   pantoprazole  40 mg Oral Daily   polyethylene glycol  17 g Oral Daily   predniSONE  20 mg Oral Q breakfast   simvastatin  40 mg Oral Daily   sodium chloride flush  3 mL Intravenous Q12H   acetaminophen, bisacodyl, docusate sodium, ipratropium-albuterol, melatonin, ondansetron (ZOFRAN) IV, mouth rinse, polyethylene glycol, sodium chloride flush  Assessment/ Plan:  82 y.o. 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  Baseline creatinine of 2.5/GFR 25 on April 21, 2023 AKI is likely multifactorial.  Urinalysis from May 02, 2023 shows proteinuria, negative for blood.  0-5 RBCs.  0-5 WBCs.  Low likelihood for acute  glomerulonephritis or acute interstitial nephritis. He did present with hypotension which may have led to ATN causing AKI which was subsequently exacerbated by IV contrast exposure from cardiac catheterization.  Creatinine steadily improving, 3.01 today.  No acute indication for dialysis. Continue holding hydralazine and Farxiga.   #Acute pericarditis Autoimmune workup negative from November 2024. Other differential includes viral myocarditis versus other causes. Currently getting treatment with oral steroids.   Low likelihood of uremic pericarditis in the setting of relatively recent onset of AKI Cardiology following  #Hyperkalemia, corrected   #Proteinuria Patient is noted to have proteinuria even as outpatient. Previous autoimmune workup in June 2024 was negative. Urine protein to creatinine ratio 4.7 g In October 2024. He has known numerous simple and minimally complex renal cysts bilaterally.  CT abdomen from May 02, 2023 is negative for ureteral calculi, hydronephrosis.    LOS: 6 [employer] 12/2/20241:07 PM  [address] [place], [place] [phone]

## 2023-05-08 NOTE — Consult Note (Signed)
PHARMACY - ANTICOAGULATION CONSULT NOTE  Pharmacy Consult for Apixaban Indication: atrial fibrillation  Allergies  Allergen Reactions   Meloxicam Nausea And Vomiting   Patient Measurements: Height: 5\' 10"  (177.8 cm) Weight: 89.4 kg (197 lb 1.6 oz) IBW/kg (Calculated) : 73 Heparin Dosing Weight: 81.6 kg  Vital Signs: Temp: 97.6 F (36.4 C) (12/02 1211) Temp Source: Oral (12/02 1211) BP: 152/84 (12/02 1211) Pulse Rate: 100 (12/02 1211)  Labs: Recent Labs    05/06/23 0259 05/06/23 1229 05/07/23 0552 05/07/23 1504 05/07/23 2337 05/08/23 0456 05/08/23 0904  HGB 9.0*  --  9.0*  --   --  9.5*  --   HCT 27.5*  --  28.6*  --   --  30.2*  --   PLT 286  --  301  --   --  345  --   HEPARINUNFRC 0.10*   < > 0.39 0.32 0.28*  --  0.44  CREATININE 4.37*  --  3.78*  --   --  3.01*  --    < > = values in this interval not displayed.    Estimated Creatinine Clearance: 21.3 mL/min (A) (by C-G formula based on SCr of 3.01 mg/dL (H)).  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 male who presented with chest pain and SOB. PMH significant for HFpEF, HTN, HDL and CKD stage IIIb. EKG showed ST elevation. Cath showed mild nonobstructive CAD. Patient was newly diagnosed with atrial  fibrillation with RVR. CHA2DS2-VASc score 6 (HTN, HF, age, HTN). 11/30 Hgb improved to 9 today after transfusing 1 unit PRBC on 11/29.     Goal of Therapy:  Heparin level 0.3-0.7 units/ml Monitor platelets by anticoagulation protocol: Yes   Plan:  Heparin transition to apixaban 2.5 mg BID. (Age and Scr qualify pt for the dose reduction).   [name], PharmD, BCPS 05/08/2023 4:04 PM

## 2023-05-08 NOTE — Progress Notes (Addendum)
Cardiology Progress Note   Patient Name: [name] Date of Encounter: 05/08/2023  Primary Cardiologist: Julien Nordmann, MD  Subjective   Didn't sleep well last night - restless.  Wonders if it's the prednisone.  Feels well this AM.  Sitting at bedside w/ family in room. No chest pain, dyspnea, or palpitations.  Remains in afib in low 100's.  H/H stable. Objective   Inpatient Medications    Scheduled Meds:  Chlorhexidine Gluconate Cloth  6 each Topical Daily   feeding supplement  237 mL Oral TID BM   levothyroxine  25 mcg Oral Q0600   lidocaine  1 patch Transdermal Q24H   metoprolol succinate  100 mg Oral BID   multivitamin  1 tablet Oral QHS   pantoprazole  40 mg Oral Daily   polyethylene glycol  17 g Oral Daily   predniSONE  20 mg Oral Q breakfast   simvastatin  40 mg Oral Daily   sodium chloride flush  3 mL Intravenous Q12H   Continuous Infusions:  heparin 2,050 Units/hr (05/08/23 1142)   PRN Meds: acetaminophen, bisacodyl, docusate sodium, ipratropium-albuterol, melatonin, ondansetron (ZOFRAN) IV, mouth rinse, polyethylene glycol, sodium chloride flush   Vital Signs    Vitals:   05/08/23 0004 05/08/23 0402 05/08/23 0924 05/08/23 1211  BP: (!) 155/85 (!) 142/80 (!) 143/72 (!) 152/84  Pulse: 87 94 (!) 113 100  Resp: 20 18  (!) 27  Temp: 97.7 F (36.5 C) 97.8 F (36.6 C) 97.7 F (36.5 C) 97.6 F (36.4 C)  TempSrc:   Oral Oral  SpO2: 97% 95% 97% 96%  Weight:      Height:        Intake/Output Summary (Last 24 hours) at 05/08/2023 1255 Last data filed at 05/08/2023 0900 Gross per 24 hour  Intake 909.69 ml  Output 875 ml  Net 34.69 ml   Filed Weights   05/05/23 0500 05/06/23 0500 05/07/23 0500  Weight: 87.8 kg 87 kg 89.4 kg    Physical Exam   GEN: Well nourished, well developed, in no acute distress.  HEENT: Grossly normal.  Neck: Supple, no JVD, carotid bruits, or masses. Cardiac: IR, IR, no murmurs, rubs, or gallops. No clubbing, cyanosis, 1+  bilat LE edema.  Radials 2+, DP/PT 2+ and equal bilaterally.  Respiratory:  Respirations regular and unlabored, clear to auscultation bilaterally. GI: Soft, nontender, nondistended, BS + x 4. MS: no deformity or atrophy. Skin: warm and dry, no rash. Neuro:  Strength and sensation are intact. Psych: AAOx3.  Normal affect.  Labs    Chemistry Recent Labs  Lab 05/02/23 1516 05/02/23 2205 05/03/23 0532 05/03/23 1115 05/03/23 2309 05/06/23 0259 05/07/23 0552 05/08/23 0456  NA 134* 133*   < > 133*   < > 131* 137 139  K 5.5* 5.5*   < > 5.2*   < > 4.6 4.7 4.7  CL 105 105   < > 103   < > 96* 103 107  CO2 17* 16*   < > 18*   < > 21* 21* 21*  GLUCOSE 130* 124*   < > 124*   < > 138* 105* 107*  BUN 63* 68*   < > 74*   < > 95* 96* 93*  CREATININE 4.52* 4.78*   < > 4.88*   < > 4.37* 3.78* 3.01*  CALCIUM 7.2* 7.2*   < > 6.9*   < > 6.7* 7.6* 7.8*  PROT 5.7* 5.5*  --   --   --   --   --   --  ALBUMIN 2.6* 2.4*  --  2.3*  --   --   --   --   AST 13* 12*  --   --   --   --   --   --   ALT 13 13  --   --   --   --   --   --   ALKPHOS 116 111  --   --   --   --   --   --   BILITOT 0.7 0.9  --   --   --   --   --   --   GFRNONAA 12* 11*   < > 11*   < > 13* 15* 20*  ANIONGAP 12 12   < > 12   < > 14 13 11    < > = values in this interval not displayed.     Hematology Recent Labs  Lab 05/06/23 0259 05/07/23 0552 05/08/23 0456  WBC 8.6 8.0 10.1  RBC 3.09* 3.19* 3.29*  HGB 9.0* 9.0* 9.5*  HCT 27.5* 28.6* 30.2*  MCV 89.0 89.7 91.8  MCH 29.1 28.2 28.9  MCHC 32.7 31.5 31.5  RDW 18.6* 18.2* 18.3*  PLT 286 301 345    Cardiac Enzymes  Recent Labs  Lab 05/02/23 1516 05/02/23 1725  TROPONINIHS 32* 41*      BNP    Component Value Date/Time   BNP 349.9 (H) 05/02/2023 2205   Lipids  Lab Results  Component Value Date   CHOL 84 05/02/2023   HDL 23 (L) 05/02/2023   LDLCALC 47 05/02/2023   TRIG 68 05/02/2023   CHOLHDL 3.7 05/02/2023    HbA1c  Lab Results  Component Value Date    HGBA1C  11/21/2007    5.9 (NOTE)   The ADA recommends the following therapeutic goals for glycemic   control related to Hgb A1C measurement:   Goal of Therapy:   < 7.0% Hgb A1C   Action Suggested:  > 8.0% Hgb A1C   Ref:  Diabetes Care, 22, Suppl. 1, 1999    Radiology    --------   Telemetry    Afib, low 100's - Personally Reviewed  Cardiac Studies   Cardiac Catheterization  11.26.2024  Diagnostic Dominance: Right  _____________   2D Echocardiogram 12.1.2024  1. Left ventricular ejection fraction, by estimation, is 45 to 50%. The  left ventricle has mildly decreased function. The left ventricle  demonstrates global hypokinesis. Left ventricular diastolic parameters are  indeterminate.   2. Right ventricular systolic function is normal. The right ventricular  size is normal. There is mildly elevated pulmonary artery systolic  pressure.   3. Left atrial size was severely dilated.   4. Right atrial size was moderately dilated.   5. Small pericardial effusion that is slightly larger than the echo  05/03/23. a small pericardial effusion is present. There is no evidence of  cardiac tamponade.   6. The mitral valve is normal in structure. Mild mitral valve  regurgitation. No evidence of mitral stenosis.   7. The aortic valve is tricuspid. There is mild calcification of the  aortic valve. Aortic valve regurgitation is not visualized. No aortic  stenosis is present.   8. Aortic dilatation noted. There is mild dilatation of the ascending  aorta, measuring 40 mm.   9. The inferior vena cava is dilated in size with >50% respiratory  variability, suggesting right atrial pressure of 8 mmHg.  _____________   Patient Profile  82 y.o. 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  Assessment & Plan    1.   Acute pericarditis/myocarditis:  Presented w/ chest pain and initial concern for STEMI.  Cath 11/26 w/ relatively nl cors.  Echo w/ EF 40-45% w/ glob HK and f/u limited echo on 12/1 w/ EF of 45-50% and small pericardial effusion.  He has been managed w/ prednisone in the setting of AKI/CKD limiting colchicine dosing to one dose, and avoidance of nsaids.  ESR down-trending thus far, 127  109  74 on 12/1.  As prev noted, will trend ESR w/ plan to taper prednisone once it normalizes.  Pt eager to get off of steroids - restless, not sleeping well, but understands rationale for use.  2.  AFib w/ RVR:  New dx.  CHA2DS2VASc = 6.  Sev dil LA on echo.  Remains in afib this AM w/ rates in the low 100's.  BP elevated.  Will increase toprol xl to 100 bid - ideally would like to see rates < 100.  Remains on heparin w/ stable H/H this AM.  Cont heparin for now.  May want to have his hematologist weigh in re: long term OAC.  3.  HFmrEF:  EF 40-45% on initial echo w/ sl improvement to 45-50% by echo 12/1. Titrating ? blocker.  He does have lower ext edema on exam.  No wt since 11/30, which was 2 kg higher than admission wt.  Pt notes good UO in the absence of diuretics (minus 140 ml overnight but + 5.8 L since admission).  Follow and defer use of diuretics to nephrology.  If pressures stable following titration, consider bidil.  No acei/arb/arni/mra/sglt2i 2/2 AKI/CKD/hyperkalemia.  4.  Acute on chronic anemia w/ myelofibrosis:  s/p PRBCs 11/29.  Tolerating heparin well w/ stable H/H.  5.  Acute on chronic stage III-IV kidney dzs:  Creat cont to improve - 3.01 this AM.  Signed, Nicolasa Ducking, NP  05/08/2023, 12:55 PM    For questions or updates, please contact   Please consult [email or web address] for contact info under Cardiology/STEMI.

## 2023-05-08 NOTE — Progress Notes (Signed)
Physical Therapy Treatment Patient Details Name: [name] MRN: [id number] DOB: 04/14/1941 Today's Date: 05/08/2023   History of Present Illness [name] is a 82 y.o. male with medical history significant of chronic HFpEF, hypertension, hyperlipidemia, myelofibrosis, CKD stage IIIb, anemia of chronic disease, who presented initially to [facility] ED as a STEMI alert.    PT Comments  Patient is agreeable to PT session. He walked in the hallway using IV pole for intermittent support with CGA for safety. Patient is fatigued with ambulation, Sp02 91% on room air. Occasional safety cues required with transfers. Encouraged proper positioning of RUE while in the chair with sling in place. Recommend to continue PT to maximize independence and facilitate return to prior level of function.    If plan is discharge home, recommend the following: A lot of help with walking and/or transfers;A lot of help with bathing/dressing/bathroom;Assistance with cooking/housework;Assist for transportation;Help with stairs or ramp for entrance   Can travel by private vehicle        Equipment Recommendations  None recommended by PT    Recommendations for Other Services       Precautions / Restrictions Precautions Precautions: Fall;Shoulder Restrictions Weight Bearing Restrictions: Yes RUE Weight Bearing: Non weight bearing Other Position/Activity Restrictions: shoulder/elbow/wrist/hand ROM ok     Mobility  Bed Mobility               General bed mobility comments: not assessed    Transfers Overall transfer level: Needs assistance Equipment used: None Transfers: Sit to/from Stand Sit to Stand: Contact guard assist           General transfer comment: recieved patient while ambulating in hallway with OT. stand to sit required CGA with cues for positioning for safety    Ambulation/Gait Ambulation/Gait assistance: Contact guard assist Gait Distance (Feet): 80 Feet Assistive device: IV  Pole Gait Pattern/deviations: Step-through pattern, Decreased stride length Gait velocity: decreased     General Gait Details: patient reporting fatigue with ambulation and unable to progress ambulation further at this time. Sp02 91% on room air.   Stairs             Wheelchair Mobility     Tilt Bed    Modified Rankin (Stroke Patients Only)       Balance Overall balance assessment: Needs assistance Sitting-balance support: No upper extremity supported, Feet supported Sitting balance-Leahy Scale: Normal     Standing balance support: Single extremity supported, During functional activity Standing balance-Leahy Scale: Fair                              Cognition Arousal: Alert Behavior During Therapy: WFL for tasks assessed/performed Overall Cognitive Status: Within Functional Limits for tasks assessed                                          Exercises      General Comments        Pertinent Vitals/Pain Pain Assessment Pain Assessment: No/denies pain    Home Living                          Prior Function            PT Goals (current goals can now be found in the care plan section) Acute Rehab PT Goals  Patient Stated Goal: to get stronger PT Goal Formulation: With patient Time For Goal Achievement: 05/18/23 Potential to Achieve Goals: Good Progress towards PT goals: Progressing toward goals    Frequency    Min 1X/week      PT Plan      Co-evaluation              AM-PAC PT "6 Clicks" Mobility   Outcome Measure  Help needed turning from your back to your side while in a flat bed without using bedrails?: A Little Help needed moving from lying on your back to sitting on the side of a flat bed without using bedrails?: A Little Help needed moving to and from a bed to a chair (including a wheelchair)?: A Little Help needed standing up from a chair using your arms (e.g., wheelchair or bedside  chair)?: A Little Help needed to walk in hospital room?: A Lot Help needed climbing 3-5 steps with a railing? : A Lot 6 Click Score: 16    End of Session   Activity Tolerance: Patient tolerated treatment well;Patient limited by fatigue Patient left: in chair;with call bell/phone within reach;with family/visitor present   PT Visit Diagnosis: Unsteadiness on feet (R26.81);History of falling (Z91.81);Repeated falls (R29.6);Muscle weakness (generalized) (M62.81);Pain     Time: 1147-1200 PT Time Calculation (min) (ACUTE ONLY): 13 min  Charges:    $Therapeutic Activity: 8-22 mins PT General Charges $$ ACUTE PT VISIT: 1 Visit                     Donna Bernard, PT, MPT    Ina Homes 05/08/2023, 1:44 PM

## 2023-05-08 NOTE — TOC Progression Note (Signed)
Transition of Care [facility]) - Progression Note    Patient Details  Name: [name] MRN: [id number] Date of Birth: 03/06/1941  [facility] CM/SW Contact  Margarito Liner, LCSW Phone Number: 05/08/2023, 12:09 PM  Clinical Narrative:  Patient and his wife have accepted the bed offer from [facility]. CSW left message for admissions coordinator to see when they will have a bed.  Expected Discharge Plan: Home/Self Care Barriers to Discharge: Continued Medical Work up  Expected Discharge Plan and Services                                               Social Determinants of Health (SDOH) Interventions SDOH Screenings   Food Insecurity: No Food Insecurity (05/02/2023)  Housing: Low Risk  (05/02/2023)  Transportation Needs: No Transportation Needs (05/02/2023)  Utilities: Not At Risk (05/02/2023)  Alcohol Screen: Low Risk  (07/12/2021)  Depression (PHQ2-9): Low Risk  (04/04/2023)  Financial Resource Strain: Low Risk  (07/12/2021)  Physical Activity: Inactive (07/12/2021)  Social Connections: Socially Integrated (07/12/2021)  Stress: No Stress Concern Present (07/12/2021)  Tobacco Use: Medium Risk (05/02/2023)    Readmission Risk Interventions    04/21/2023   10:16 AM  Readmission Risk Prevention Plan  Transportation Screening Complete  HRI or Home Care Consult Complete  Social Work Consult for Recovery Care Planning/Counseling Complete  Palliative Care Screening Not Applicable  Medication Review Oceanographer) Complete

## 2023-05-08 NOTE — Progress Notes (Signed)
Occupational Therapy Treatment Patient Details Name: [name] MRN: [id number] DOB: 1941-01-09 Today's Date: 05/08/2023   History of present illness [name] is a 82 y.o. male with medical history significant of chronic HFpEF, hypertension, hyperlipidemia, myelofibrosis, CKD stage IIIb, anemia of chronic disease, who presented initially to [facility] ED as a STEMI alert.   OT comments  Mr Kallsen was seen for OT treatment on this date. Upon arrival to room pt seated in chair, agreeable to tx. Pt requires  MAX A don B socks in sitting. MOD A don sling CGA + HHA for ADL t/f ~80 ft; endorses fatigue and shortness of breath, Sp02 91% on RA. Spouse in room, reviewed HEP and seated AAROM exercises requiring cues to complete. Pt making good progress toward goals, will continue to follow POC. Discharge recommendation remains appropriate.       If plan is discharge home, recommend the following:  A little help with walking and/or transfers;A lot of help with bathing/dressing/bathroom;Assistance with cooking/housework;Assist for transportation;Help with stairs or ramp for entrance;Direct supervision/assist for medications management   Equipment Recommendations  Other (comment) (defer)    Recommendations for Other Services      Precautions / Restrictions Precautions Precautions: Fall;Shoulder Restrictions Weight Bearing Restrictions: No RUE Weight Bearing: Non weight bearing Other Position/Activity Restrictions: shoulder/elbow/wrist/hand ROM ok       Mobility Bed Mobility               General bed mobility comments: not tested    Transfers Overall transfer level: Needs assistance Equipment used: 1 person hand held assist Transfers: Sit to/from Stand Sit to Stand: Contact guard assist                 Balance Overall balance assessment: Needs assistance Sitting-balance support: No upper extremity supported, Feet supported Sitting balance-Leahy Scale: Normal     Standing  balance support: Single extremity supported, During functional activity Standing balance-Leahy Scale: Fair                             ADL either performed or assessed with clinical judgement   ADL Overall ADL's : Needs assistance/impaired                                       General ADL Comments: MAX A don B socks in sitting. MOD A don sling CGA + HHA for ADL t/f ~80 ft - endorses fatigue and shortness of breath Sp02 91% on RA.       Cognition Arousal: Alert Behavior During Therapy: WFL for tasks assessed/performed Overall Cognitive Status: Within Functional Limits for tasks assessed                                          Exercises Other Exercises Other Exercises: reviewed HEP and seated AAROM exercises            Pertinent Vitals/ Pain       Pain Assessment Pain Assessment: No/denies pain   Frequency  Min 1X/week        Progress Toward Goals  OT Goals(current goals can now be found in the care plan section)  Progress towards OT goals: Progressing toward goals  Acute Rehab OT Goals Patient Stated Goal: to go to  rehab OT Goal Formulation: With patient/family Time For Goal Achievement: 05/17/23 Potential to Achieve Goals: Good ADL Goals Pt Will Perform Grooming: with supervision;sitting Pt/caregiver will Perform Home Exercise Program: Right Upper extremity;Increased ROM;Increased strength  Plan      Co-evaluation                 AM-PAC OT "6 Clicks" Daily Activity     Outcome Measure   Help from another person eating meals?: A Little Help from another person taking care of personal grooming?: A Little Help from another person toileting, which includes using toliet, bedpan, or urinal?: A Little Help from another person bathing (including washing, rinsing, drying)?: A Lot Help from another person to put on and taking off regular upper body clothing?: A Lot Help from another person to put on and  taking off regular lower body clothing?: A Lot 6 Click Score: 15    End of Session Equipment Utilized During Treatment: Rolling walker (2 wheels)  OT Visit Diagnosis: Other abnormalities of gait and mobility (R26.89);Repeated falls (R29.6);Muscle weakness (generalized) (M62.81)   Activity Tolerance Patient tolerated treatment well   Patient Left in chair;with call bell/phone within reach;with family/visitor present   Nurse Communication Mobility status        Time: 1140-1150 OT Time Calculation (min): 10 min  Charges: OT General Charges $OT Visit: 1 Visit OT Treatments $Self Care/Home Management : 8-22 mins  Kathie Dike, M.S. OTR/L  05/08/23, 1:21 PM  ascom [phone]

## 2023-05-08 NOTE — Consult Note (Signed)
PHARMACY - ANTICOAGULATION CONSULT NOTE  Pharmacy Consult for Heparin Indication: atrial fibrillation  Allergies  Allergen Reactions   Meloxicam Nausea And Vomiting   Patient Measurements: Height: 5\' 10"  (177.8 cm) Weight: 89.4 kg (197 lb 1.6 oz) IBW/kg (Calculated) : 73 Heparin Dosing Weight: 81.6 kg  Vital Signs: Temp: 97.7 F (36.5 C) (12/02 0924) BP: 143/72 (12/02 0924) Pulse Rate: 113 (12/02 0924)  Labs: Recent Labs    05/06/23 0259 05/06/23 1229 05/07/23 0552 05/07/23 1504 05/07/23 2337 05/08/23 0456 05/08/23 0904  HGB 9.0*  --  9.0*  --   --  9.5*  --   HCT 27.5*  --  28.6*  --   --  30.2*  --   PLT 286  --  301  --   --  345  --   HEPARINUNFRC 0.10*   < > 0.39 0.32 0.28*  --  0.44  CREATININE 4.37*  --  3.78*  --   --  3.01*  --    < > = values in this interval not displayed.    Estimated Creatinine Clearance: 21.3 mL/min (A) (by C-G formula based on SCr of 3.01 mg/dL (H)).  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 male who presented with chest pain and SOB. PMH significant for HFpEF, HTN, HDL and CKD stage IIIb. EKG showed ST elevation. Cath showed mild nonobstructive CAD. Patient was newly diagnosed with atrial fibrillation with RVR.  CHA2DS2-VASc score 6 (HTN, HF, age, HTN). 11/30 Hgb improved to 9 today after transfusing 1 unit PRBC on 11/29.   Date Time HL Rate/Comment 11/29 1713 <0.10 SUBtherapeutic 11/30 0259 0.10 Subtherapeutic 11/30 1229 0.20 Subtherapeutic 12/01 0552 0.39 Therapeutic x 1@1750un /hr 12/01 1504 0.32 Therapeutic x 2 @1900un /hr ~ 5 hours 12/01 2337 0.28 Subtherapeutic 12/02 0904 0.44    Goal of Therapy:  Heparin level 0.3-0.7 units/ml Monitor platelets by anticoagulation protocol: Yes   Plan:  Heparin level is therapeutic. Will continue heparin infusion at 2050 units/hr. Recheck heparin level and CBC with AM labs.   Paschal Dopp, PharmD, BCPS 05/08/2023 10:10 AM

## 2023-05-08 NOTE — Consult Note (Signed)
PHARMACY - ANTICOAGULATION CONSULT NOTE  Pharmacy Consult for Heparin Indication: atrial fibrillation  Allergies  Allergen Reactions   Meloxicam Nausea And Vomiting   Patient Measurements: Height: 5\' 10"  (177.8 cm) Weight: 89.4 kg (197 lb 1.6 oz) IBW/kg (Calculated) : 73 Heparin Dosing Weight: 81.6 kg  Vital Signs: Temp: 97.7 F (36.5 C) (12/02 0004) Temp Source: Oral (12/01 1227) BP: 155/85 (12/02 0004) Pulse Rate: 87 (12/02 0004)  Labs: Recent Labs    05/05/23 0432 05/05/23 1713 05/06/23 0259 05/06/23 1229 05/07/23 0552 05/07/23 1504 05/07/23 2337  HGB 7.7*  --  9.0*  --  9.0*  --   --   HCT 23.7*  --  27.5*  --  28.6*  --   --   PLT 248  --  286  --  301  --   --   HEPARINUNFRC  --    < > 0.10*   < > 0.39 0.32 0.28*  CREATININE 5.01*  --  4.37*  --  3.78*  --   --    < > = values in this interval not displayed.    Estimated Creatinine Clearance: 17 mL/min (A) (by C-G formula based on SCr of 3.78 mg/dL (H)).  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 male who presented with chest pain and SOB. PMH significant for HFpEF, HTN, HDL and CKD stage IIIb. EKG showed ST elevation. Cath showed mild nonobstructive CAD. Patient was newly diagnosed with atrial  fibrillation with RVR. CHA2DS2-VASc score 6 (HTN, HF, age, HTN).   05/06/2023 - Hgb improved to 9 today after transfusing 1 unit PRBC on 11/29 - No issues with infusion reported, no signs/symptoms of bleeding noted.  Date Time HL Rate/Comment 11/29 1713 <0.10 SUBtherapeutic 11/30 0259 0.10 Subtherapeutic 11/30 1229 0.20 Subtherapeutic 12/01 0552 0.39 Therapeutic x 1@1750un /hr 12/01 1504 0.32 Therapeutic x 2 @1900un /hr ~ 5 hours 12/01 2337 0.28 Subtherapeutic   Goal of Therapy:  Heparin level 0.3-0.7 units/ml Monitor platelets by anticoagulation protocol: Yes   Plan:  Bolus 1200 units x 1 Increase heparin infusion to rate of 2050 units/hr Recheck HL in 8 hrs after rate change  Continue to monitor H&H and platelets daily  Otelia Sergeant, PharmD, [facility] 05/08/2023 12:26 AM

## 2023-05-08 NOTE — Progress Notes (Signed)
Progress Note   Patient: [name] ZOX:[id number] DOB: 1940-12-19 DOA: 05/02/2023     6 DOS: the patient was seen and examined on 05/08/2023   Brief hospital course: [name] is a 82 y.o. male with medical history significant of chronic HFpEF, hypertension, hyperlipidemia, myelofibrosis, CKD stage IIIb, anemia of chronic disease, who presented initially to [facility] ED with chest pain and shortness of breath.  Troponin 41, EKG showed ST elevation.  Patient was taken to the Cath Lab, did not show any occlusion in coronary arteries.  Condition consistent with acute pericarditis.  Treated with colchicine, then changed to prednisone. Also has significant worsening renal function at the time of admission, associated with hyperkalemia.  Patient was given IV fluids, Lokelma.  Followed by nephrology. 11/28.  Patient developed atrial fibrillation, seen by cardiology, placed on heparin drip. 11/29.  Transfuse 1 unit of PRBC. 12/2.  Renal function significantly improved.  Pending rehab.   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 Chronic systolic congestive heart failure. STEMI ruled out.  Patient has been seen by cardiology, cardiac cath did not show significant coronary artery disease.  Patient has acute pericarditis.  She is treated with a lower dose of prednisone to 20 mg  daily. Echocardiogram showed ejection fraction 40 to 45%, mild pulm hypertension.  Did not show any pericardial effusion. Repeated echocardiogram showed a small amount of pericardial effusion. Will discuss with cardiology about tapering down prednisone dose if needed.   New onset atrial fibrillation with rapid ventricular response. Patient followed by cardiology, started on heparin drip. Heart rate better controlled.  Will discuss with cardiology about continued oral anticoagulation.  Patient will also be referred to outpatient watchman procedure.   Myelodysplastic syndrome. Acute on chronic anemia. Received 1 unit PRBC on 11/29, hemoglobin is better.   Acute hypoxemic respiratory failure. Patient had a signal short of breath, potentially from pericarditis.  Oxygen saturation dropped down to 87%, patient on 2 L oxygen.  Patient is also followed by nephrology and cardiology for volume status.  Currently does not have volume overload.   Patient condition improved, off oxygen  with good saturation.   SIRS. Likely hypovolemic shock. Patient had a significant hypotension, resulting in severe acute kidney injury.  He also has a mild elevation of procalcitonin level, but could not identify any source of infection. He was placed on broad-spectrum antibiotics with vancomycin and cefepime.  Blood culture has no growth.  Procalcitonin level still elevated, I could not rule out a source of pneumonia.  Repeat chest x-ray still did not see a pneumonia, antibiotics discontinued.  Procalcitonin level now at 0.8. Condition resolved.   Acute renal failure on chronic kidney disease stage IIIb secondary to ATN. Hyperkalemia secondary to acute renal failure. Metabolic acidosis secondary to acute renal failure. Hyponatremia. Patient also received IV contrast, but renal function already worsened at the time of admission.  Worsening renal function appears  to be secondary to  hypotension.  Patient was on IV  pressor, was able to wean off 11/28. Blood pressure is more stable now.   Patient started have polyuria, renal function improving.   Patient was able to urinate after removal of Foley catheter. Creatinine has dropped down to 3.01, was 2.37 on 11//2024.  This is close to baseline.  Recheck BMP lab tomorrow.     Anasarca. Right arm lymphedema. CT scan abdomen/pelvis showed evidence of anasarca, this could be secondary to renal failure. Right arm lymphedema, no evidence of infection.  Seen by orthopedics. Condition improving with large amount of urine output.        Subjective:  Much better, able to ambulate with physical therapy. No shortness of breath.  Physical Exam: Vitals:   05/08/23 0004 05/08/23 0402 05/08/23 0924 05/08/23 1211  BP: (!) 155/85 (!) 142/80 (!) 143/72 (!) 152/84  Pulse: 87 94 (!) 113 100  Resp: 20 18  (!) 27  Temp: 97.7 F (36.5 C) 97.8 F (36.6 C) 97.7 F (36.5 C) 97.6 F (36.4 C)  TempSrc:   Oral Oral  SpO2: 97% 95% 97% 96%  Weight:      Height:       General exam: Appears calm and comfortable  Respiratory system: Clear to auscultation. Respiratory effort normal. Cardiovascular system: Irregular. No JVD, murmurs, rubs, gallops or clicks. No pedal edema. Gastrointestinal system: Abdomen is nondistended, soft and nontender. No organomegaly or masses felt. Normal bowel sounds heard. Central nervous system: Alert and oriented. No focal neurological deficits. Extremities: Right arm edema improving. Skin: No rashes, lesions or ulcers Psychiatry: Judgement and insight appear normal. Mood & affect appropriate.    Data Reviewed:  Lab results reviewed.  Family Communication: Wife updated at bedside.  Disposition: Status is: Inpatient Remains inpatient appropriate because: Severity of disease.  Pending SNF.     Time spent: 35 minutes  Author: Marrion Coy, MD 05/08/2023 12:35 PM  For on call review [email or web address].

## 2023-05-09 DIAGNOSIS — E875 Hyperkalemia: Secondary | ICD-10-CM | POA: Diagnosis not present

## 2023-05-09 DIAGNOSIS — G9009 Other idiopathic peripheral autonomic neuropathy: Secondary | ICD-10-CM | POA: Diagnosis not present

## 2023-05-09 DIAGNOSIS — N179 Acute kidney failure, unspecified: Secondary | ICD-10-CM | POA: Diagnosis not present

## 2023-05-09 DIAGNOSIS — N400 Enlarged prostate without lower urinary tract symptoms: Secondary | ICD-10-CM | POA: Diagnosis not present

## 2023-05-09 DIAGNOSIS — R809 Proteinuria, unspecified: Secondary | ICD-10-CM | POA: Diagnosis not present

## 2023-05-09 DIAGNOSIS — N184 Chronic kidney disease, stage 4 (severe): Secondary | ICD-10-CM | POA: Diagnosis not present

## 2023-05-09 DIAGNOSIS — K219 Gastro-esophageal reflux disease without esophagitis: Secondary | ICD-10-CM | POA: Diagnosis not present

## 2023-05-09 DIAGNOSIS — Z7401 Bed confinement status: Secondary | ICD-10-CM | POA: Diagnosis not present

## 2023-05-09 DIAGNOSIS — I309 Acute pericarditis, unspecified: Secondary | ICD-10-CM | POA: Diagnosis not present

## 2023-05-09 DIAGNOSIS — Z741 Need for assistance with personal care: Secondary | ICD-10-CM | POA: Diagnosis not present

## 2023-05-09 DIAGNOSIS — D649 Anemia, unspecified: Secondary | ICD-10-CM | POA: Diagnosis not present

## 2023-05-09 DIAGNOSIS — I10 Essential (primary) hypertension: Secondary | ICD-10-CM | POA: Diagnosis not present

## 2023-05-09 DIAGNOSIS — R42 Dizziness and giddiness: Secondary | ICD-10-CM | POA: Diagnosis not present

## 2023-05-09 DIAGNOSIS — I5021 Acute systolic (congestive) heart failure: Secondary | ICD-10-CM | POA: Diagnosis not present

## 2023-05-09 DIAGNOSIS — M6259 Muscle wasting and atrophy, not elsewhere classified, multiple sites: Secondary | ICD-10-CM | POA: Diagnosis not present

## 2023-05-09 DIAGNOSIS — M1990 Unspecified osteoarthritis, unspecified site: Secondary | ICD-10-CM | POA: Diagnosis not present

## 2023-05-09 DIAGNOSIS — I5033 Acute on chronic diastolic (congestive) heart failure: Secondary | ICD-10-CM | POA: Diagnosis not present

## 2023-05-09 DIAGNOSIS — N171 Acute kidney failure with acute cortical necrosis: Secondary | ICD-10-CM | POA: Diagnosis not present

## 2023-05-09 DIAGNOSIS — E039 Hypothyroidism, unspecified: Secondary | ICD-10-CM | POA: Diagnosis not present

## 2023-05-09 DIAGNOSIS — E785 Hyperlipidemia, unspecified: Secondary | ICD-10-CM | POA: Diagnosis not present

## 2023-05-09 DIAGNOSIS — I959 Hypotension, unspecified: Secondary | ICD-10-CM | POA: Diagnosis not present

## 2023-05-09 DIAGNOSIS — I4891 Unspecified atrial fibrillation: Secondary | ICD-10-CM | POA: Diagnosis not present

## 2023-05-09 DIAGNOSIS — R2681 Unsteadiness on feet: Secondary | ICD-10-CM | POA: Diagnosis not present

## 2023-05-09 DIAGNOSIS — D469 Myelodysplastic syndrome, unspecified: Secondary | ICD-10-CM | POA: Diagnosis not present

## 2023-05-09 DIAGNOSIS — Z23 Encounter for immunization: Secondary | ICD-10-CM | POA: Diagnosis not present

## 2023-05-09 DIAGNOSIS — E569 Vitamin deficiency, unspecified: Secondary | ICD-10-CM | POA: Diagnosis not present

## 2023-05-09 LAB — BASIC METABOLIC PANEL
Anion gap: 11 (ref 5–15)
BUN: 91 mg/dL — ABNORMAL HIGH (ref 8–23)
CO2: 21 mmol/L — ABNORMAL LOW (ref 22–32)
Calcium: 8.3 mg/dL — ABNORMAL LOW (ref 8.9–10.3)
Chloride: 109 mmol/L (ref 98–111)
Creatinine, Ser: 2.83 mg/dL — ABNORMAL HIGH (ref 0.61–1.24)
GFR, Estimated: 22 mL/min — ABNORMAL LOW (ref >60)
Glucose, Bld: 125 mg/dL — ABNORMAL HIGH (ref 70–99)
Potassium: 5 mmol/L (ref 3.5–5.1)
Sodium: 141 mmol/L (ref 135–145)

## 2023-05-09 LAB — CBC
HCT: 28.8 % — ABNORMAL LOW (ref 39.0–52.0)
Hemoglobin: 9 g/dL — ABNORMAL LOW (ref 13.0–17.0)
MCH: 28.7 pg (ref 26.0–34.0)
MCHC: 31.3 g/dL (ref 30.0–36.0)
MCV: 91.7 fL (ref 80.0–100.0)
Platelets: 404 10*3/uL — ABNORMAL HIGH (ref 150–400)
RBC: 3.14 MIL/uL — ABNORMAL LOW (ref 4.22–5.81)
RDW: 18.3 % — ABNORMAL HIGH (ref 11.5–15.5)
WBC: 11.6 10*3/uL — ABNORMAL HIGH (ref 4.0–10.5)
nRBC: 0.6 % — ABNORMAL HIGH (ref 0.0–0.2)

## 2023-05-09 LAB — MAGNESIUM: Magnesium: 1.9 mg/dL (ref 1.7–2.4)

## 2023-05-09 LAB — SEDIMENTATION RATE: Sed Rate: 65 mm/h — ABNORMAL HIGH (ref 0–20)

## 2023-05-09 LAB — GLUCOSE, CAPILLARY
Glucose-Capillary: 118 mg/dL — ABNORMAL HIGH (ref 70–99)
Glucose-Capillary: 150 mg/dL — ABNORMAL HIGH (ref 70–99)

## 2023-05-09 MED ORDER — PREDNISONE 20 MG PO TABS: 10.0000 mg | ORAL_TABLET | Freq: Every day | ORAL | Status: AC

## 2023-05-09 MED ORDER — APIXABAN 2.5 MG PO TABS: 2.5000 mg | ORAL_TABLET | Freq: Two times a day (BID) | ORAL | Status: DC

## 2023-05-09 MED ORDER — SODIUM BICARBONATE 650 MG PO TABS: 650.0000 mg | ORAL_TABLET | Freq: Two times a day (BID) | ORAL | Status: DC

## 2023-05-09 MED ORDER — GABAPENTIN 300 MG PO CAPS: 300.0000 mg | ORAL_CAPSULE | Freq: Two times a day (BID) | ORAL | Status: DC

## 2023-05-09 MED ORDER — METOPROLOL SUCCINATE ER 100 MG PO TB24: 100.0000 mg | ORAL_TABLET | Freq: Two times a day (BID) | ORAL | Status: DC

## 2023-05-09 NOTE — Plan of Care (Signed)
  Problem: Education: Goal: Knowledge of General Education information will improve Description: Including pain rating scale, medication(s)/side effects and non-pharmacologic comfort measures Outcome: Progressing   Problem: Health Behavior/Discharge Planning: Goal: Ability to manage health-related needs will improve Outcome: Progressing   Problem: Clinical Measurements: Goal: Ability to maintain clinical measurements within normal limits will improve Outcome: Progressing Goal: Will remain free from infection Outcome: Progressing Goal: Diagnostic test results will improve Outcome: Progressing Goal: Respiratory complications will improve Outcome: Progressing Goal: Cardiovascular complication will be avoided Outcome: Progressing   Problem: Activity: Goal: Risk for activity intolerance will decrease Outcome: Progressing   Problem: Nutrition: Goal: Adequate nutrition will be maintained Outcome: Progressing   Problem: Coping: Goal: Level of anxiety will decrease Outcome: Progressing   Problem: Elimination: Goal: Will not experience complications related to bowel motility Outcome: Progressing Goal: Will not experience complications related to urinary retention Outcome: Progressing   Problem: Pain Management: Goal: General experience of comfort will improve Outcome: Progressing   Problem: Safety: Goal: Ability to remain free from injury will improve Outcome: Progressing   Problem: Skin Integrity: Goal: Risk for impaired skin integrity will decrease Outcome: Progressing   Problem: Education: Goal: Understanding of CV disease, CV risk reduction, and recovery process will improve Outcome: Progressing   Problem: Activity: Goal: Ability to return to baseline activity level will improve Outcome: Progressing   Problem: Cardiovascular: Goal: Ability to achieve and maintain adequate cardiovascular perfusion will improve Outcome: Progressing Goal: Vascular access site(s)  Level 0-1 will be maintained Outcome: Progressing   Problem: Health Behavior/Discharge Planning: Goal: Ability to safely manage health-related needs after discharge will improve Outcome: Progressing

## 2023-05-09 NOTE — TOC Transition Note (Addendum)
Transition of Care [facility]) - CM/SW Discharge Note   Patient Details  Name: [name] MRN: [id number] Date of Birth: June 08, 1940  Transition of [facility]) CM/SW Contact:  Liliana Cline, LCSW Phone Number: 05/09/2023, 9:39 AM   Clinical Narrative:    Discharge to [facility] in [place] today. Room 712. Confirmed with Admissions Worker Tammy. Updated MD, RN, and spouse. Asked RN to call report. EMS paperwork completed.  12:14- 12:10- [facility] called for transport. Patient is 4th on the list.    Final next level of care: Skilled Nursing Facility Barriers to Discharge: Barriers Resolved   Patient Goals and CMS Choice CMS Medicare.gov Compare Post Acute Care list provided to:: Patient Represenative (must comment) Choice offered to / list presented to : Spouse  Discharge Placement                Patient chooses bed at: [facility] [place] Patient to be transferred to facility by: [facility] Name of family member notified: [name] - spouse Patient and family notified of of transfer: 05/09/23  Discharge Plan and Services Additional resources added to the After Visit Summary for                                       Social Determinants of Health (SDOH) Interventions SDOH Screenings   Food Insecurity: No Food Insecurity (05/02/2023)  Housing: Low Risk  (05/02/2023)  Transportation Needs: No Transportation Needs (05/02/2023)  Utilities: Not At Risk (05/02/2023)  Alcohol Screen: Low Risk  (07/12/2021)  Depression (PHQ2-9): Low Risk  (04/04/2023)  Financial Resource Strain: Low Risk  (07/12/2021)  Physical Activity: Inactive (07/12/2021)  Social Connections: Socially Integrated (07/12/2021)  Stress: No Stress Concern Present (07/12/2021)  Tobacco Use: Medium Risk (05/02/2023)     Readmission Risk Interventions    04/21/2023   10:16 AM  Readmission Risk Prevention Plan  Transportation Screening Complete  HRI or Home Care Consult Complete  Social Work Consult for  Recovery Care Planning/Counseling Complete  Palliative Care Screening Not Applicable  Medication Review Oceanographer) Complete

## 2023-05-09 NOTE — Discharge Summary (Signed)
Physician Discharge Summary   Patient: [name] MRN: [id number] DOB: [birth year]  Admit date:     05/02/2023  Discharge date: 05/09/23  Discharge Physician: Marrion Coy   PCP: [name], DO   Recommendations at discharge:   Follow-up with PCP in 1 week. Follow-up with nephrology in 1 week. Follow-up with cardiology in 1 week. Check a BMP in 3 days.  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 *  Hospital Course: [name] is a 82 y.o. male with medical history significant of chronic HFpEF, hypertension, hyperlipidemia, myelofibrosis, CKD stage IIIb, anemia of chronic disease, who presented initially to [facility] ED with chest pain and shortness of breath.  Troponin 41, EKG showed ST elevation.  Patient was taken to the Cath Lab, did not show any occlusion in coronary arteries.  Condition consistent with acute pericarditis.  Treated with colchicine, then changed to prednisone. Also has significant worsening renal function at the time of admission, associated with hyperkalemia.  Patient was given IV fluids, Lokelma.  Followed by nephrology. 11/28.  Patient developed atrial fibrillation, seen by cardiology, placed on heparin drip. 11/29.  Transfuse 1 unit of PRBC. 12/2.  Renal function significantly improved.   12.3.  Continue to improve,  medically stable for discharge to rehab.  Assessment and Plan: Acute pericarditis. Chronic systolic congestive heart failure. STEMI ruled out.  Patient has been seen by cardiology, cardiac cath did not show significant coronary artery disease.  Patient has acute pericarditis.  She is treated with a lower dose of prednisone to 20 mg daily. Echocardiogram showed ejection fraction 40 to 45%, mild pulm hypertension.  Did not show any pericardial effusion. Repeated echocardiogram showed a small amount of pericardial effusion. Will continue prednisone at a reduced dose of 10 mg daily.  Patient currently does not have exacerbation of congestive heart failure, he is not on diuretics.  Patient be followed with cardiology as outpatient   New onset atrial fibrillation with rapid ventricular response. Patient followed by cardiology, started on heparin drip. Heart rate better controlled.  Anticoagulation has changed to oral Eliquis, heart rate much better with increased dose of metoprolol.  Patient will be followed by cardiology as outpatient to consider possible watchman.   Myelodysplastic syndrome. Acute on chronic anemia. Received 1 unit PRBC on 11/29, hemoglobin is better.   Acute hypoxemic respiratory failure. Patient had a signal short of breath, potentially from pericarditis.  Oxygen saturation dropped down to 87%, patient on 2 L oxygen.  Patient is also followed by nephrology and cardiology for volume status.  Currently does not have volume overload.   Patient condition improved, off oxygen  with good saturation.   SIRS. Likely hypovolemic shock. Patient had a significant hypotension, resulting in severe acute kidney injury.  He also has a mild elevation of procalcitonin level, but could not identify any source of infection. He was placed on  broad-spectrum antibiotics with vancomycin and cefepime.  Blood culture has no growth.  Procalcitonin level still elevated, I could not rule out a source of  pneumonia.  Repeat chest x-ray still did not see a pneumonia, antibiotics discontinued.  Procalcitonin level now at 0.8. Condition resolved.   Acute renal failure on chronic kidney disease stage IIIb secondary to ATN. Hyperkalemia secondary to acute renal failure. Metabolic acidosis secondary to acute renal failure. Hyponatremia. Patient also received IV contrast, but renal function already worsened at the time of admission.  Worsening renal function appears to be secondary to  hypotension.  Patient was on IV pressor, was able to wean off 11/28. Blood pressure is more stable now.   Patient started have polyuria, renal function improving.   Patient was able to urinate after removal of Foley catheter. Creatinine has dropped down to 2.8, was 2.37 on 11/4//2024.       Anasarca. Right arm lymphedema. CT scan abdomen/pelvis showed evidence of anasarca, this could be secondary to renal failure. Right arm lymphedema, no evidence of infection.  Seen by orthopedics. Condition improving with large amount of urine output.         Consultants: Nephrology, cardiology, orthopedics. Procedures performed: None  Disposition: Skilled nursing facility Diet recommendation:  Discharge Diet Orders (From admission, onward)     Start     Ordered   05/09/23 0000  Diet - low sodium heart healthy        05/09/23 1034           Cardiac diet DISCHARGE MEDICATION: Allergies as of 05/09/2023       Reactions   Meloxicam Nausea And Vomiting        Medication List     STOP taking these medications    amLODipine 10 MG tablet Commonly known as: NORVASC   clopidogrel 75 MG tablet Commonly known as: PLAVIX   dapagliflozin propanediol 10 MG Tabs tablet Commonly known as: FARXIGA   hydrALAZINE 25 MG tablet Commonly known as: APRESOLINE   oxyCODONE 5 MG immediate release tablet Commonly known as: Roxicodone   tadalafil 5 MG tablet Commonly known as: CIALIS   terbinafine 250 MG  tablet Commonly known as: LAMISIL       TAKE these medications    acetaminophen 500 MG tablet Commonly known as: TYLENOL Take 1,000 mg by mouth every 6 (six) hours as needed for mild pain (pain score 1-3).   apixaban 2.5 MG Tabs tablet Commonly known as: ELIQUIS Take 1 tablet (2.5 mg total) by mouth 2 (two) times daily.   azelastine 0.1 % nasal spray Commonly known as: ASTELIN Place 1 spray into both nostrils 2 (two) times daily. Use in each nostril as directed What changed:  when to take this reasons to take this   CITRACAL + D PO Take 1 tablet by mouth in the morning.   D3-1000 PO Take 1,000 Units by mouth in the morning.   DULoxetine 20 MG capsule Commonly known as: CYMBALTA Take 1 capsule (20 mg total) by mouth at bedtime. TAKE 1 CAPSULE BY MOUTH EVERY DAY   gabapentin 300 MG capsule Commonly known as: NEURONTIN Take 1 capsule (300 mg total) by mouth 2 (two) times daily. What changed:  how much to take when to take this   ketoconazole 2 % cream Commonly known as: NIZORAL Apply twice daily to affected area on buttocks until clear   levothyroxine 25 MCG tablet Commonly known as: SYNTHROID TAKE 1 TABLET EVERY DAY ON EMPTY STOMACHWITH A GLASS  OF WATER AT LEAST 30-60 MINBEFORE BREAKFAST   lidocaine 5 % Commonly known as: Lidoderm Place 1 patch onto the skin daily. Remove & Discard patch within 12 hours or as directed by MD What changed:  when to take this reasons to take this   meclizine 25 MG tablet Commonly known as: ANTIVERT TAKE 1 TABLET BY MOUTH THREE TIMES DAILYAS NEEDED FOR DIZZINESS   metoprolol succinate 100 MG 24 hr tablet Commonly known as: TOPROL-XL Take 1 tablet (100 mg total) by mouth 2 (two) times daily. Take with or immediately following a meal.   multivitamin with minerals Tabs tablet Take 1 tablet by mouth in the morning.   omeprazole 40 MG capsule Commonly known as: PRILOSEC Take 1 capsule (40 mg total) by mouth at bedtime.    predniSONE 20 MG tablet Commonly known as: DELTASONE Take 0.5 tablets (10 mg total) by mouth daily with breakfast for 14 days. Start taking on: May 10, 2023   simvastatin 40 MG tablet Commonly known as: ZOCOR Take 1 tablet (40 mg total) by mouth daily.   sodium bicarbonate 650 MG tablet Take 1 tablet (650 mg total) by mouth 2 (two) times daily. What changed: how much to take   tamsulosin 0.4 MG Caps capsule Commonly known as: FLOMAX Take 2 capsules (0.8 mg total) by mouth every evening.   zinc gluconate 50 MG tablet Take 50 mg by mouth in the morning.        Contact information for follow-up providers     Johnson, Megan P, DO Follow up in 1 week(s).   Specialty: Family Medicine Contact information: [address] [place] [address] [phone]         Antonieta Iba, MD Follow up in 1 week(s).   Specialty: Cardiology Contact information: [address] [place] [address] [phone]         Mady Haagensen, MD Follow up in 1 week(s).   Specialty: Nephrology Contact information: [address] [place] [address] [phone]              Contact information for after-discharge care     Destination     [facility] Preferred SNF .   Service: Skilled Nursing Contact information: [address] [place] [address] [phone]                    Discharge Exam: Ceasar Mons Weights   05/05/23 0500 05/06/23 0500 05/07/23 0500  Weight: 87.8 kg 87 kg 89.4 kg   General exam: Appears calm and comfortable  Respiratory system: Clear to auscultation. Respiratory effort normal. Cardiovascular system: Irregularly irregular. No JVD, murmurs, rubs, gallops or clicks. No pedal edema. Gastrointestinal system: Abdomen is nondistended, soft and nontender. No organomegaly or masses felt. Normal bowel sounds heard. Central nervous system: Alert and oriented. No focal neurological  deficits. Extremities: Right arm edema much improved. Skin: No rashes, lesions or ulcers Psychiatry: Judgement and insight appear normal. Mood & affect appropriate.     Condition at discharge: good  The results of significant diagnostics from this hospitalization (including imaging, microbiology, ancillary and laboratory) are listed below for reference.   Imaging Studies: ECHOCARDIOGRAM LIMITED  Result Date: 05/07/2023    ECHOCARDIOGRAM LIMITED REPORT   Patient Name:   [name] Date of Exam: 05/07/2023 Medical Rec #:  [id number]         Height:       70.0 in  Accession #:    [id number]        Weight:       197.1 lb Date of Birth:  1940/06/26         BSA:          2.074 m Patient Age:    82 years          BP:           137/81 mmHg Patient Gender: M                 HR:           102 bpm. Exam Location:  [facility] Procedure: 2D Echo Indications:     Pericardial effusion I31.3  History:         Patient has prior history of Echocardiogram examinations.  Sonographer:     Elwin Sleight RDCS Referring Phys:  [id number] Raymon Mutton DUNN Diagnosing Phys: Chilton Si MD  Sonographer Comments: Image acquisition challenging due to respiratory motion. IMPRESSIONS  1. Left ventricular ejection fraction, by estimation, is 45 to 50%. The left ventricle has mildly decreased function. The left ventricle demonstrates global hypokinesis. Left ventricular diastolic parameters are indeterminate.  2. Right ventricular systolic function is normal. The right ventricular size is normal. There is mildly elevated pulmonary artery systolic pressure.  3. Left atrial size was severely dilated.  4. Right atrial size was moderately dilated.  5. Small pericardial effusion that is slightly larger than the echo 05/03/23. a small pericardial effusion is present. There is no evidence of cardiac tamponade.  6. The mitral valve is normal in structure. Mild mitral valve regurgitation. No evidence of mitral stenosis.  7. The aortic valve is  tricuspid. There is mild calcification of the aortic valve. Aortic valve regurgitation is not visualized. No aortic stenosis is present.  8. Aortic dilatation noted. There is mild dilatation of the ascending aorta, measuring 40 mm.  9. The inferior vena cava is dilated in size with >50% respiratory variability, suggesting right atrial pressure of 8 mmHg. FINDINGS  Left Ventricle: Left ventricular ejection fraction, by estimation, is 45 to 50%. The left ventricle has mildly decreased function. The left ventricle demonstrates global hypokinesis. The left ventricular internal cavity size was normal in size. There is  no left ventricular hypertrophy. Left ventricular diastolic parameters are indeterminate. Left ventricular diastolic function could not be evaluated due to atrial fibrillation. Right Ventricle: The right ventricular size is normal. No increase in right ventricular wall thickness. Right ventricular systolic function is normal. There is mildly elevated pulmonary artery systolic pressure. The tricuspid regurgitant velocity is 2.84  m/s, and with an assumed right atrial pressure of 8 mmHg, the estimated right ventricular systolic pressure is 40.3 mmHg. Left Atrium: Left atrial size was severely dilated. Right Atrium: Right atrial size was moderately dilated. Pericardium: Small pericardial effusion that is slightly larger than the echo 05/03/23. A small pericardial effusion is present. There is no evidence of cardiac tamponade. Mitral Valve: The mitral valve is normal in structure. Mild mitral valve regurgitation. No evidence of mitral valve stenosis. Tricuspid Valve: The tricuspid valve is normal in structure. Tricuspid valve regurgitation is trivial. No evidence of tricuspid stenosis. Aortic Valve: The aortic valve is tricuspid. There is mild calcification of the aortic valve. Aortic valve regurgitation is not visualized. No aortic stenosis is present. Aortic valve peak gradient measures 14.9 mmHg. Pulmonic  Valve: The pulmonic valve was normal in structure. Pulmonic valve regurgitation is not visualized. No evidence of pulmonic stenosis.  Aorta: Aortic dilatation noted. There is mild dilatation of the ascending aorta, measuring 40 mm. Venous: The inferior vena cava is dilated in size with greater than 50% respiratory variability, suggesting right atrial pressure of 8 mmHg. IAS/Shunts: No atrial level shunt detected by color flow Doppler. LEFT VENTRICLE PLAX 2D LVIDd:         4.80 cm      Diastology LVIDs:         3.60 cm      LV e' lateral: 12.00 cm/s LV PW:         1.30 cm LV IVS:        1.30 cm LVOT diam:     2.30 cm LV SV:         61 LV SV Index:   30 LVOT Area:     4.15 cm  LV Volumes (MOD) LV vol d, MOD A2C: 108.0 ml LV vol d, MOD A4C: 121.0 ml LV vol s, MOD A2C: 60.3 ml LV vol s, MOD A4C: 63.7 ml LV SV MOD A2C:     47.7 ml LV SV MOD A4C:     121.0 ml LV SV MOD BP:      52.9 ml RIGHT VENTRICLE RV Basal diam:  4.00 cm RV S prime:     18.00 cm/s TAPSE (M-mode): 2.1 cm LEFT ATRIUM             Index        RIGHT ATRIUM           Index LA diam:        4.20 cm 2.02 cm/m   RA Area:     25.40 cm LA Vol (A2C):   88.6 ml 42.71 ml/m  RA Volume:   75.00 ml  36.15 ml/m LA Vol (A4C):   75.3 ml 36.30 ml/m LA Biplane Vol: 81.9 ml 39.48 ml/m  AORTIC VALVE                 PULMONIC VALVE AV Area (Vmax): 2.15 cm     PV Vmax:        0.94 m/s AV Vmax:        193.00 cm/s  PV Peak grad:   3.5 mmHg AV Peak Grad:   14.9 mmHg    RVOT Peak grad: 2 mmHg LVOT Vmax:      99.90 cm/s LVOT Vmean:     56.100 cm/s LVOT VTI:       0.148 m  AORTA Ao Root diam: 3.90 cm Ao Asc diam:  4.00 cm TRICUSPID VALVE TR Peak grad:   32.3 mmHg TR Vmax:        284.00 cm/s  SHUNTS Systemic VTI:  0.15 m Systemic Diam: 2.30 cm Chilton Si MD Electronically signed by Chilton Si MD Signature Date/Time: 05/07/2023/4:15:20 PM    Final (Updated)    DG Chest Port 1 View  Result Date: 05/04/2023 CLINICAL DATA:  Pneumonia.  Follow-up study. EXAM:  PORTABLE CHEST 1 VIEW COMPARISON:  05/02/2023 and older exams. FINDINGS: Cardiac silhouette borderline enlarged. No mediastinal hilar masses. Mild hazy opacity at the lung bases suspected to be atelectasis, with possible small effusions. Remainder of the lungs is clear. No pneumothorax. IMPRESSION: 1. Mild bibasilar opacity suspected to be atelectasis with possible small effusions. 2. No evidence of pneumonia. Electronically Signed   By: Amie Portland M.D.   On: 05/04/2023 14:25   ECHOCARDIOGRAM COMPLETE  Result Date: 05/03/2023    ECHOCARDIOGRAM REPORT   Patient Name:  [name] Date of Exam: 05/03/2023 Medical Rec #:  [id number]         Height:       70.0 in Accession #:    [id number]        Weight:       191.1 lb Date of Birth:  26-Oct-1940         BSA:          2.048 m Patient Age:    82 years          BP:           106/63 mmHg Patient Gender: M                 HR:           85 bpm. Exam Location:  [facility] Procedure: 2D Echo, Cardiac Doppler and Color Doppler Indications:     Chest Pain  History:         Patient has prior history of Echocardiogram examinations, most                  recent 11/20/2007. Signs/Symptoms:Chest Pain and                  Dizziness/Lightheadedness; Risk Factors:Hypertension and                  Dyslipidemia. CKD, Acute Pericarditus.  Sonographer:     Mikki Harbor Referring Phys:  [id number] Antonieta Iba Diagnosing Phys: Julien Nordmann MD IMPRESSIONS  1. Left ventricular ejection fraction, by estimation, is 40 to 45%. The left ventricle has mildly decreased function. The left ventricle demonstrates global hypokinesis. There is mild left ventricular hypertrophy. Left ventricular diastolic parameters are indeterminate.  2. Right ventricular systolic function is mildly reduced. The right ventricular size is normal. There is mildly elevated pulmonary artery systolic pressure. The estimated right ventricular systolic pressure is 41.8 mmHg.  3. Left atrial size was moderately  dilated.  4. The mitral valve is normal in structure. Mild mitral valve regurgitation. No evidence of mitral stenosis.  5. Tricuspid valve regurgitation is mild to moderate.  6. The aortic valve is normal in structure. Aortic valve regurgitation is not visualized. No aortic stenosis is present.  7. The inferior vena cava is dilated in size with <50% respiratory variability, suggesting right atrial pressure of 15 mmHg. FINDINGS  Left Ventricle: Left ventricular ejection fraction, by estimation, is 40 to 45%. The left ventricle has mildly decreased function. The left ventricle demonstrates global hypokinesis. The left ventricular internal cavity size was normal in size. There is  mild left ventricular hypertrophy. Left ventricular diastolic parameters are indeterminate. Right Ventricle: The right ventricular size is normal. No increase in right ventricular wall thickness. Right ventricular systolic function is mildly reduced. There is mildly elevated pulmonary artery systolic pressure. The tricuspid regurgitant velocity  is 2.59 m/s, and with an assumed right atrial pressure of 15 mmHg, the estimated right ventricular systolic pressure is 41.8 mmHg. Left Atrium: Left atrial size was moderately dilated. Right Atrium: Right atrial size was normal in size. Pericardium: There is no evidence of pericardial effusion. Mitral Valve: The mitral valve is normal in structure. Mild mitral valve regurgitation. No evidence of mitral valve stenosis. MV peak gradient, 7.8 mmHg. The mean mitral valve gradient is 2.0 mmHg. Tricuspid Valve: The tricuspid valve is normal in structure. Tricuspid valve regurgitation is mild to moderate. No evidence of tricuspid stenosis. Aortic Valve: The aortic valve is normal in structure.  Aortic valve regurgitation is not visualized. No aortic stenosis is present. Aortic valve mean gradient measures 5.7 mmHg. Aortic valve peak gradient measures 9.8 mmHg. Aortic valve area, by VTI measures 2.11 cm.  Pulmonic Valve: The pulmonic valve was normal in structure. Pulmonic valve regurgitation is not visualized. No evidence of pulmonic stenosis. Aorta: The aortic root is normal in size and structure. Venous: The inferior vena cava is dilated in size with less than 50% respiratory variability, suggesting right atrial pressure of 15 mmHg. IAS/Shunts: No atrial level shunt detected by color flow Doppler.  LEFT VENTRICLE PLAX 2D LVIDd:         4.50 cm   Diastology LVIDs:         3.30 cm   LV e' medial:   124.00 cm/s LV PW:         1.40 cm   LV E/e' medial: 1.0 LV IVS:        1.30 cm LVOT diam:     2.00 cm LV SV:         62 LV SV Index:   30 LVOT Area:     3.14 cm  RIGHT VENTRICLE RV Basal diam:  4.40 cm RV Mid diam:    4.10 cm LEFT ATRIUM              Index        RIGHT ATRIUM           Index LA diam:        4.00 cm  1.95 cm/m   RA Area:     29.10 cm LA Vol (A2C):   143.0 ml 69.84 ml/m  RA Volume:   103.00 ml 50.31 ml/m LA Vol (A4C):   117.0 ml 57.14 ml/m LA Biplane Vol: 132.0 ml 64.47 ml/m  AORTIC VALVE                     PULMONIC VALVE AV Area (Vmax):    2.42 cm      PV Vmax:       1.02 m/s AV Area (Vmean):   1.88 cm      PV Peak grad:  4.2 mmHg AV Area (VTI):     2.11 cm AV Vmax:           156.33 cm/s AV Vmean:          114.000 cm/s AV VTI:            0.295 m AV Peak Grad:      9.8 mmHg AV Mean Grad:      5.7 mmHg LVOT Vmax:         120.50 cm/s LVOT Vmean:        68.400 cm/s LVOT VTI:          0.198 m LVOT/AV VTI ratio: 0.67  AORTA Ao Root diam: 3.90 cm MITRAL VALVE                TRICUSPID VALVE MV Area (PHT): 3.48 cm     TR Peak grad:   26.8 mmHg MV Area VTI:   2.25 cm     TR Vmax:        259.00 cm/s MV Peak grad:  7.8 mmHg MV Mean grad:  2.0 mmHg     SHUNTS MV Vmax:       1.40 m/s     Systemic VTI:  0.20 m MV Vmean:      62.9 cm/s    Systemic Diam: 2.00  cm MV Decel Time: 218 msec MV E velocity: 123.00 cm/s Julien Nordmann MD Electronically signed by Julien Nordmann MD Signature Date/Time:  05/03/2023/5:23:19 PM    Final (Updated)    US Venous Img Upper Uni Right(DVT)  Result Date: 05/03/2023 CLINICAL DATA:  Right upper extremity pain and edema. Evaluate for DVT. EXAM: RIGHT UPPER EXTREMITY VENOUS DOPPLER ULTRASOUND TECHNIQUE: Gray-scale sonography with graded compression, as well as color Doppler and duplex ultrasound were performed to evaluate the upper extremity deep venous system from the level of the subclavian vein and including the jugular, axillary, basilic, radial, ulnar and upper cephalic vein. Spectral Doppler was utilized to evaluate flow at rest and with distal augmentation maneuvers. COMPARISON:  None Available. FINDINGS: Contralateral Subclavian Vein: Respiratory phasicity is normal and symmetric with the symptomatic side. No evidence of thrombus. Normal compressibility. Internal Jugular Vein: No evidence of thrombus. Normal compressibility, respiratory phasicity and response to augmentation. Subclavian Vein: No evidence of thrombus. Normal compressibility, respiratory phasicity and response to augmentation. Axillary Vein: No evidence of thrombus. Normal compressibility, respiratory phasicity and response to augmentation. Cephalic Vein: No evidence of thrombus. Normal compressibility, respiratory phasicity and response to augmentation. Basilic Vein: No evidence of thrombus. Normal compressibility, respiratory phasicity and response to augmentation. Brachial Veins: No evidence of thrombus. Normal compressibility, respiratory phasicity and response to augmentation. Radial Veins: No evidence of thrombus. Normal compressibility, respiratory phasicity and response to augmentation. Ulnar Veins: No evidence of thrombus. Normal compressibility, respiratory phasicity and response to augmentation. Other Findings: There is a minimal amount of subcutaneous edema at the level of the right upper arm and forearm. IMPRESSION: No evidence of DVT within the right upper extremity. Electronically  Signed   By: Simonne Come M.D.   On: 05/03/2023 12:55   US Venous Img Lower Bilateral (DVT)  Result Date: 05/03/2023 EXAM: BILATERAL LOWER EXTREMITY VENOUS DOPPLER ULTRASOUND TECHNIQUE: Gray-scale sonography with graded compression, as well as color Doppler and duplex ultrasound were performed to evaluate the lower extremity deep venous systems from the level of the common femoral vein and including the common femoral, femoral, profunda femoral, popliteal and calf veins including the posterior tibial, peroneal and gastrocnemius veins when visible. The superficial great saphenous vein was also interrogated. Spectral Doppler was utilized to evaluate flow at rest and with distal augmentation maneuvers in the common femoral, femoral and popliteal veins. COMPARISON:  None Available. FINDINGS: RIGHT LOWER EXTREMITY Common Femoral Vein: No evidence of thrombus. Normal compressibility, respiratory phasicity and response to augmentation. Saphenofemoral Junction: No evidence of thrombus. Normal compressibility and flow on color Doppler imaging. Profunda Femoral Vein: No evidence of thrombus. Normal compressibility and flow on color Doppler imaging. Femoral Vein: No evidence of thrombus. Normal compressibility, respiratory phasicity and response to augmentation. Popliteal Vein: No evidence of thrombus. Normal compressibility, respiratory phasicity and response to augmentation. Calf Veins: No evidence of thrombus. Normal compressibility and flow on color Doppler imaging. Superficial Great Saphenous Vein: No evidence of thrombus. Normal compressibility. Venous Reflux:  None. Other Findings:  None. LEFT LOWER EXTREMITY Common Femoral Vein: No evidence of thrombus. Normal compressibility, respiratory phasicity and response to augmentation. Saphenofemoral Junction: No evidence of thrombus. Normal compressibility and flow on color Doppler imaging. Profunda Femoral Vein: No evidence of thrombus. Normal compressibility and flow on  color Doppler imaging. Femoral Vein: No evidence of thrombus. Normal compressibility, respiratory phasicity and response to augmentation. Popliteal Vein: No evidence of thrombus. Normal compressibility, respiratory phasicity and response to augmentation. Calf Veins: No evidence of thrombus. Normal compressibility  and flow on color Doppler imaging. Superficial Great Saphenous Vein: No evidence of thrombus. Normal compressibility. Venous Reflux:  None. Other Findings:  None. IMPRESSION: No evidence of deep venous thrombosis in either lower extremity. Electronically Signed   By: Malachy Moan M.D.   On: 05/03/2023 06:27   DG Chest Port 1 View  Result Date: 05/03/2023 CLINICAL DATA:  Hypoxia EXAM: PORTABLE CHEST 1 VIEW COMPARISON:  Chest x-ray 07/08/2015 FINDINGS: Heart is borderline enlarged. There is no focal lung infiltrate, pleural effusion or pneumothorax. Right shoulder arthroplasty is present. No acute fractures are seen. IMPRESSION: Borderline cardiomegaly. No active disease. Electronically Signed   By: Darliss Cheney M.D.   On: 05/03/2023 00:54   CT ABDOMEN PELVIS WO CONTRAST  Result Date: 05/02/2023 CLINICAL DATA:  Acute nonlocalized abdominal pain. Myelofibrosis, chronic renal insufficiency EXAM: CT ABDOMEN AND PELVIS WITHOUT CONTRAST TECHNIQUE: Multidetector CT imaging of the abdomen and pelvis was performed following the standard protocol without IV contrast. RADIATION DOSE REDUCTION: This exam was performed according to the departmental dose-optimization program which includes automated exposure control, adjustment of the mA and/or kV according to patient size and/or use of iterative reconstruction technique. COMPARISON:  11/14/2016 FINDINGS: Lower chest: Small right and trace left pleural effusions are present associated bibasilar atelectasis. Small pericardial effusion. Global cardiac size is mildly enlarged. Hypoattenuation of the cardiac blood pool is in keeping with at least mild anemia.  Hepatobiliary: Mild periportal edema. No intra or extrahepatic biliary ductal dilation. Liver otherwise unremarkable on this noncontrast examination. Gallbladder unremarkable. Pancreas: Unremarkable. No pancreatic ductal dilatation or surrounding inflammatory changes. Spleen: Moderate splenomegaly noted with the spleen measuring a 2.6 cm in greatest dimension. No definite intrasplenic lesion seen on this noncontrast examination. Adrenals/Urinary Tract: The adrenal glands are unremarkable. There has, however, developed infiltration within the retroperitoneum surrounding the adrenal glands reflecting retroperitoneal edema as well as shotty retroperitoneal adenopathy within the periportal, left periaortic, and aortocaval lymph node groups, possibly reactive in nature or indicative of a low-grade lymphoproliferative process. Numerous simple and minimally complex renal cysts are seen bilaterally for which no follow-up imaging is recommended. No urinary renal or ureteral calculi. No hydronephrosis. The bladder is unremarkable save for a small amount dependently layering excreted contrast. Stomach/Bowel: Pancolonic diverticulosis, severe within the sigmoid colon, without superimposed acute inflammatory change. Stomach, small-bowel, and large bowel are otherwise unremarkable. Appendix normal. No free intraperitoneal gas or fluid. Vascular/Lymphatic: Extensive aortoiliac atherosclerotic calcification. No aortic aneurysm. Near occlusive atherosclerotic calcification left common femoral artery. Aside from a previously mentioned shotty adenopathy within the upper retroperitoneum, no pathologic adenopathy within the abdomen and pelvis. Reproductive: Prostate is unremarkable. Other: Interval development of diffuse subcutaneous body wall edema. No abdominal wall hernia Musculoskeletal: The osseous structures are diffusely mildly sclerotic in keeping with the given history of myelofibrosis. No acute bone abnormality. No focal lytic  or blastic bone lesion. Osseous structures are otherwise age-appropriate. IMPRESSION: 1. Interval development of small right and trace left pleural effusions, small pericardial effusion, retroperitoneal edema and diffuse subcutaneous body wall edema, in keeping with anasarca. 2. Stable moderate splenomegaly. Diffuse osseous sclerosis. Findings are in keeping with changes of myelofibrosis. 3. Interval development of shotty retroperitoneal adenopathy within the periportal, left periaortic, and aortocaval lymph node groups, possibly reactive in nature or indicative of a low-grade lymphoproliferative process. 4. Pancolonic diverticulosis, severe within the sigmoid colon, without superimposed acute inflammatory change. Aortic Atherosclerosis (ICD10-I70.0). Electronically Signed   By: Helyn Numbers M.D.   On: 05/02/2023 22:51   CARDIAC CATHETERIZATION  Result Date: 05/02/2023 Conclusions: Mild, non-obstructive coronary artery disease. Mildly elevated left ventricular filling pressure. Bedside ultrasound with mildly reduced LVEF with global hypokinesis and small pericardial effusion.  Query pericarditis/myocarditis. Recommendations: Admit to stepdown. Start amiodarone bolus/infusion for atrial fibrillation with rapid ventricular response. PRBC transfusion for target hemoglobin greater than 8. If hemoglobin stable and no enlargement of pericardial effusion, will need to consider heparin infusion in the setting of a-fib with RVR. Yvonne Kendall, MD [facility]  US Venous Img Upper Right (DVT Study)  Result Date: 04/17/2023 CLINICAL DATA:  Hand swelling and pain. EXAM: RIGHT UPPER EXTREMITY VENOUS DOPPLER ULTRASOUND TECHNIQUE: Gray-scale sonography with graded compression, as well as color Doppler and duplex ultrasound were performed to evaluate the upper extremity deep venous system from the level of the subclavian vein and including the jugular, axillary, basilic, radial, ulnar and upper cephalic vein.  Spectral Doppler was utilized to evaluate flow at rest and with distal augmentation maneuvers. COMPARISON:  None Available. FINDINGS: Contralateral Subclavian Vein: Respiratory phasicity is normal and symmetric with the symptomatic side. No evidence of thrombus. Normal compressibility. Internal Jugular Vein: No evidence of thrombus. Normal compressibility, respiratory phasicity and response to augmentation. Subclavian Vein: No evidence of thrombus. Normal compressibility, respiratory phasicity and response to augmentation. Axillary Vein: Unable to evaluate, as per the ultrasound technologist, secondary to recent surgery, limited patient positioning and patient altered mental status. Cephalic Vein: Unable to evaluate, as per the ultrasound technologist, secondary to recent surgery, limited patient positioning and patient altered mental status. Basilic Vein: Unable to evaluate, as per the ultrasound technologist, secondary to recent surgery, limited patient positioning and patient altered mental status. Brachial Veins: Unable to evaluate, as per the ultrasound technologist, secondary to recent surgery, limited patient positioning and patient altered mental status. Radial Veins: Unable to evaluate, as per the ultrasound technologist, secondary to recent surgery, limited patient positioning and patient altered mental status. Ulnar Veins: Unable to evaluate, as per the ultrasound technologist, secondary to recent surgery, limited patient positioning and patient altered mental status. Venous Reflux:  None visualized. Other Findings:  None visualized. IMPRESSION: Markedly limited study, as described above, without evidence of DVT within the RIGHT internal jugular vein and RIGHT subclavian vein. Electronically Signed   By: Aram Candela M.D.   On: 04/17/2023 21:42   CT Head Wo Contrast  Result Date: 04/17/2023 CLINICAL DATA:  Mental status change, unknown cause Patient reports hearing a "pop" when sitting down in  chair yesterday. Also had a reverse right shoulder replacement on 11/7- arm in sling with cooling system. EXAM: CT HEAD WITHOUT CONTRAST TECHNIQUE: Contiguous axial images were obtained from the base of the skull through the vertex without intravenous contrast. RADIATION DOSE REDUCTION: This exam was performed according to the departmental dose-optimization program which includes automated exposure control, adjustment of the mA and/or kV according to patient size and/or use of iterative reconstruction technique. COMPARISON:  MRI head 04/12/2022 FINDINGS: Brain: Patchy and confluent areas of decreased attenuation are noted throughout the deep and periventricular white matter of the cerebral hemispheres bilaterally, compatible with chronic microvascular ischemic disease. No evidence of large-territorial acute infarction. No parenchymal hemorrhage. No mass lesion. No extra-axial collection. No mass effect or midline shift. No hydrocephalus. Basilar cisterns are patent. Vascular: No hyperdense vessel. Atherosclerotic calcifications are present within the cavernous internal carotid arteries. Skull: No acute fracture or focal lesion. Sinuses/Orbits: Bilateral maxillary sinus mucosal thickening. Otherwise paranasal sinuses and mastoid air cells are clear. Right scleral buckle. Bilateral lens replacement. Otherwise  the orbits are unremarkable. Other: None. IMPRESSION: No acute intracranial abnormality. Electronically Signed   By: Tish Frederickson M.D.   On: 04/17/2023 21:40   DG Knee Complete 4 Views Left  Result Date: 04/17/2023 CLINICAL DATA:  Knee pain after injury.  Heard a pop. EXAM: LEFT KNEE - COMPLETE 4+ VIEW COMPARISON:  None Available. FINDINGS: No evidence of fracture, dislocation, or significant joint effusion. Moderate osteoarthritis with tricompartmental peripheral spurring. Lateral tibiofemoral joint space narrowing. 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 Peripheral vascular calcifications. IMPRESSION: 1. No  fracture or subluxation of the left knee. 2. Moderate osteoarthritis. 3. Chondrocalcinosis. Electronically Signed   By: Narda Rutherford M.D.   On: 04/17/2023 17:45   DG Shoulder Right Port  Result Date: 04/13/2023 CLINICAL DATA:  Postop arthroplasty EXAM: RIGHT SHOULDER - 2 VIEW COMPARISON:  Preop x-ray 02/28/2016.  CT 03/03/2019 FINDINGS: Skin staples identified. Soft tissue gas seen. Changes of reverse arthroplasty. Screw component along the glenoid and Press-Fit humeral component. Slight degenerative changes of the [facility] joint. Expected alignment. Imaging was obtained to aid in treatment. IMPRESSION: Acute surgical changes of right shoulder reverse arthroplasty. Electronically Signed   By: Karen Kays M.D.   On: 04/13/2023 17:48   [place] OR NERVE BLOCK-IMAGE ONLY [facility])  Result Date: 04/13/2023 There is no interpretation for this exam.  This order is for images obtained during a surgical procedure.  Please See "Surgeries" Tab for more information regarding the procedure.    Microbiology: Results for orders placed or performed during the hospital encounter of 05/02/23  MRSA Next Gen by PCR, Nasal     Status: None   Collection Time: 05/02/23  5:32 PM   Specimen: Nasal Mucosa; Nasal Swab  Result Value Ref Range Status   MRSA by PCR Next Gen NOT DETECTED NOT DETECTED Final    Comment: (NOTE) The GeneXpert MRSA Assay (FDA approved for NASAL specimens only), is one component of a comprehensive MRSA colonization surveillance program. It is not intended to diagnose MRSA infection nor to guide or monitor treatment for MRSA infections. Test performance is not FDA approved in patients less than 46 years old. Performed at [facility], [address] [place], [place] [address]   SARS Coronavirus 2 by RT PCR (hospital order, performed in [facility] hospital lab) *cepheid single result test* Anterior Nasal Swab     Status: None   Collection Time: 05/02/23  9:04 PM   Specimen: Anterior Nasal  Swab  Result Value Ref Range Status   SARS Coronavirus 2 by RT PCR NEGATIVE NEGATIVE Final    Comment: (NOTE) SARS-CoV-2 target nucleic acids are NOT DETECTED.  The SARS-CoV-2 RNA is generally detectable in upper and lower respiratory specimens during the acute phase of infection. The lowest concentration of SARS-CoV-2 viral copies this assay can detect is 250 copies / mL. A negative result does not preclude SARS-CoV-2 infection and should not be used as the sole basis for treatment or other patient management decisions.  A negative result may occur with improper specimen collection / handling, submission of specimen other than nasopharyngeal swab, presence of viral mutation(s) within the areas targeted by this assay, and inadequate number of viral copies (<250 copies / mL). A negative result must be combined with clinical observations, patient history, and epidemiological information.  Fact Sheet for Patients:   RoadLapTop.co.za  Fact Sheet for Healthcare Providers: http://kim-miller.com/  This test is not yet approved or  cleared by the [place] FDA and has been authorized for detection and/or diagnosis of  SARS-CoV-2 by FDA under an Emergency Use Authorization (EUA).  This EUA will remain in effect (meaning this test can be used) for the duration of the COVID-19 declaration under Section 564(b)(1) of the Act, 21 U.S.C. section 360bbb-3(b)(1), unless the authorization is terminated or revoked sooner.  Performed at [facility], [address] [place], [place] [address]   Respiratory (~20 pathogens) panel by PCR     Status: None   Collection Time: 05/02/23  9:04 PM   Specimen: Nasopharyngeal Swab; Respiratory  Result Value Ref Range Status   Adenovirus NOT DETECTED NOT DETECTED Final   Coronavirus 229E NOT DETECTED NOT DETECTED Final    Comment: (NOTE) The Coronavirus on the Respiratory Panel, DOES NOT test for the  novel  Coronavirus (2019 nCoV)    Coronavirus HKU1 NOT DETECTED NOT DETECTED Final   Coronavirus NL63 NOT DETECTED NOT DETECTED Final   Coronavirus OC43 NOT DETECTED NOT DETECTED Final   Metapneumovirus NOT DETECTED NOT DETECTED Final   Rhinovirus / Enterovirus NOT DETECTED NOT DETECTED Final   Influenza A NOT DETECTED NOT DETECTED Final   Influenza B NOT DETECTED NOT DETECTED Final   Parainfluenza Virus 1 NOT DETECTED NOT DETECTED Final   Parainfluenza Virus 2 NOT DETECTED NOT DETECTED Final   Parainfluenza Virus 3 NOT DETECTED NOT DETECTED Final   Parainfluenza Virus 4 NOT DETECTED NOT DETECTED Final   Respiratory Syncytial Virus NOT DETECTED NOT DETECTED Final   Bordetella pertussis NOT DETECTED NOT DETECTED Final   Bordetella Parapertussis NOT DETECTED NOT DETECTED Final   Chlamydophila pneumoniae NOT DETECTED NOT DETECTED Final   Mycoplasma pneumoniae NOT DETECTED NOT DETECTED Final    Comment: Performed at [facility], [address] [place], [place] [address]  Culture, blood (Routine X 2) w Reflex to ID Panel     Status: None   Collection Time: 05/02/23 10:05 PM   Specimen: BLOOD  Result Value Ref Range Status   Specimen Description BLOOD RIGHT WRIST  Final   Special Requests   Final    BOTTLES DRAWN AEROBIC AND ANAEROBIC Blood Culture adequate volume   Culture   Final    NO GROWTH 5 DAYS Performed at [facility], [address] [place], [place] [address]    Report Status 05/07/2023 FINAL  Final  Culture, blood (Routine X 2) w Reflex to ID Panel     Status: None   Collection Time: 05/02/23 10:05 PM   Specimen: BLOOD  Result Value Ref Range Status   Specimen Description BLOOD RIGHT HAND  Final   Special Requests   Final    BOTTLES DRAWN AEROBIC ONLY Blood Culture adequate volume   Culture   Final    NO GROWTH 5 DAYS Performed at [facility], [address] [place], [place] [address]    Report Status 05/07/2023 FINAL  Final     Labs: CBC: Recent Labs  Lab 05/02/23 1516 05/02/23 2205 05/05/23 0432 05/06/23 0259 05/07/23 0552 05/08/23 0456 05/09/23 0407  WBC 16.3*   < > 5.8 8.6 8.0 10.1 11.6*  NEUTROABS 12.0*  --   --   --   --   --   --   HGB 7.0*   < > 7.7* 9.0* 9.0* 9.5* 9.0*  HCT 22.8*   < > 23.7* 27.5* 28.6* 30.2* 28.8*  MCV 94.2   < > 88.1 89.0 89.7 91.8 91.7  PLT 363   < > 248 286 301 345 404*   < > =  values in this interval not displayed.   Basic Metabolic Panel: Recent Labs  Lab 05/03/23 0532 05/03/23 1115 05/03/23 2309 05/04/23 0333 05/04/23 1559 05/05/23 0432 05/06/23 0259 05/07/23 0552 05/08/23 0456 05/09/23 0829  NA 133* 133*   < > 131* 131* 134* 131* 137 139 141  K 5.5* 5.2*   < > 4.8 4.6 4.5 4.6 4.7 4.7 5.0  CL 105 103   < > 100 100 99 96* 103 107 109  CO2 19* 18*   < > 17* 18* 20* 21* 21* 21* 21*  GLUCOSE 152* 124*   < > 123* 192* 150* 138* 105* 107* 125*  BUN 70* 74*   < > 79* 84* 89* 95* 96* 93* 91*  CREATININE 4.91* 4.88*   < > 5.07* 5.06* 5.01* 4.37* 3.78* 3.01* 2.83*  CALCIUM 6.9* 6.9*   < > 6.7* 6.7* 6.7* 6.7* 7.6* 7.8* 8.3*  MG 2.0  --   --  2.0 2.0 1.9 2.1  --  1.9 1.9  PHOS 7.6* 8.1*  --  7.7* 7.5* 6.6*  --   --   --   --    < > = values in this interval not displayed.   Liver Function Tests: Recent Labs  Lab 05/02/23 1516 05/02/23 2205 05/03/23 1115  AST 13* 12*  --   ALT 13 13  --   ALKPHOS 116 111  --   BILITOT 0.7 0.9  --   PROT 5.7* 5.5*  --   ALBUMIN 2.6* 2.4* 2.3*   CBG: Recent Labs  Lab 05/06/23 1125 05/07/23 0857 05/07/23 1229 05/07/23 1637 05/09/23 0754  GLUCAP 119* 105* 140* 164* 118*    Discharge time spent: greater than 30 minutes.  Signed: Marrion Coy, MD [facility] 05/09/2023

## 2023-05-09 NOTE — Progress Notes (Signed)
[facility] [place], [place] 05/09/23  Subjective:   Hospital day # 7   Patient known to our practice from outpatient follow-up of CKD.  Last seen on October 30.  He was started on Farxiga at that time.  Previous autoimmune workup in June 2024 negative ANA, anti-GBM, PLA2R, ANCA.  Creatinine as outpatient was 2.58/GFR 24 from April 05, 2023.  Patient underwent reverse right total shoulder arthroplasty on April 13, 2023. He presented to the emergency room on November 11 for right knee pain and possible hallucinations.  He was admitted from 04/17/2023 and discharged on 04/21/2023 Creatinine at the time of discharge was 2.52/GFR 25 which is at his baseline. Potassium was mildly elevated at 5.5.  Hemoglobin was 8.3.  Losartan was stopped and he was started on hydralazine 25 mg 3 times per day.  He was also prescribed gabapentin 600 mg 3 times per day.  He presented to the emergency room for complaints of hard to arouse, generalized pain.  Triage notes indicate generalized weakness.  Blood pressure was 90/86 upon arrival with heart rate of 134.  He was noted to have elevated ST segment on EKG and thought to have STEMI.  He underwent emergent cardiac catheterization which showed mild nonobstructive coronary disease, mildly reduced LVEF with global hypokinesis and small pericardial effusion.  A question was raised about pericarditis versus myocarditis.  He was started on amiodarone for atrial fibrillation with RVR.    Update Patient seen sitting up in bed, wife at bedside Alert and oriented Patient appears in good spirits Tolerating meals Room air  Objective:  Vital signs in last 24 hours:  Temp:  [97.5 F (36.4 C)-98.1 F (36.7 C)] 97.5 F (36.4 C) (12/03 1200) Pulse Rate:  [84-107] 84 (12/03 1200) Resp:  [16-18] 16 (12/03 1200) BP: (143-169)/(79-84) 169/80 (12/03 1200) SpO2:  [95 %-97 %] 96 % (12/03 1200)  Weight change:  Filed Weights   05/05/23 0500 05/06/23  0500 05/07/23 0500  Weight: 87.8 kg 87 kg 89.4 kg    Intake/Output:    Intake/Output Summary (Last 24 hours) at 05/09/2023 1305 Last data filed at 05/09/2023 0500 Gross per 24 hour  Intake 1200 ml  Output 200 ml  Net 1000 ml     Physical Exam: General: NAD  HEENT Moist oral mucous membranes  Pulm/lungs Clear to auscultation, room air  CVS/Heart Irregular rhythm  Abdomen:  Soft, tympanic, distended  Extremities: Trace peripheral edema  Neurologic: Alert, able to answer simple questions  Skin: Warm, dry  Access: None       Basic Metabolic Panel:  Recent Labs  Lab 05/03/23 0532 05/03/23 1115 05/03/23 2309 05/04/23 0333 05/04/23 1559 05/05/23 0432 05/06/23 0259 05/07/23 0552 05/08/23 0456 05/09/23 0829  NA 133* 133*   < > 131* 131* 134* 131* 137 139 141  K 5.5* 5.2*   < > 4.8 4.6 4.5 4.6 4.7 4.7 5.0  CL 105 103   < > 100 100 99 96* 103 107 109  CO2 19* 18*   < > 17* 18* 20* 21* 21* 21* 21*  GLUCOSE 152* 124*   < > 123* 192* 150* 138* 105* 107* 125*  BUN 70* 74*   < > 79* 84* 89* 95* 96* 93* 91*  CREATININE 4.91* 4.88*   < > 5.07* 5.06* 5.01* 4.37* 3.78* 3.01* 2.83*  CALCIUM 6.9* 6.9*   < > 6.7* 6.7* 6.7* 6.7* 7.6* 7.8* 8.3*  MG 2.0  --   --  2.0 2.0 1.9  2.1  --  1.9 1.9  PHOS 7.6* 8.1*  --  7.7* 7.5* 6.6*  --   --   --   --    < > = values in this interval not displayed.     CBC: Recent Labs  Lab 05/02/23 1516 05/02/23 2205 05/05/23 0432 05/06/23 0259 05/07/23 0552 05/08/23 0456 05/09/23 0407  WBC 16.3*   < > 5.8 8.6 8.0 10.1 11.6*  NEUTROABS 12.0*  --   --   --   --   --   --   HGB 7.0*   < > 7.7* 9.0* 9.0* 9.5* 9.0*  HCT 22.8*   < > 23.7* 27.5* 28.6* 30.2* 28.8*  MCV 94.2   < > 88.1 89.0 89.7 91.8 91.7  PLT 363   < > 248 286 301 345 404*   < > = values in this interval not displayed.     No results found for: "HEPBSAG", "HEPBSAB", "HEPBIGM"    Microbiology:  Recent Results (from the past 240 hour(s))  MRSA Next Gen by PCR, Nasal      Status: None   Collection Time: 05/02/23  5:32 PM   Specimen: Nasal Mucosa; Nasal Swab  Result Value Ref Range Status   MRSA by PCR Next Gen NOT DETECTED NOT DETECTED Final    Comment: (NOTE) The GeneXpert MRSA Assay (FDA approved for NASAL specimens only), is one component of a comprehensive MRSA colonization surveillance program. It is not intended to diagnose MRSA infection nor to guide or monitor treatment for MRSA infections. Test performance is not FDA approved in patients less than 83 years old. Performed at [facility], [address] [place], [place] [address]   SARS Coronavirus 2 by RT PCR (hospital order, performed in [facility] hospital lab) *cepheid single result test* Anterior Nasal Swab     Status: None   Collection Time: 05/02/23  9:04 PM   Specimen: Anterior Nasal Swab  Result Value Ref Range Status   SARS Coronavirus 2 by RT PCR NEGATIVE NEGATIVE Final    Comment: (NOTE) SARS-CoV-2 target nucleic acids are NOT DETECTED.  The SARS-CoV-2 RNA is generally detectable in upper and lower respiratory specimens during the acute phase of infection. The lowest concentration of SARS-CoV-2 viral copies this assay can detect is 250 copies / mL. A negative result does not preclude SARS-CoV-2 infection and should not be used as the sole basis for treatment or other patient management decisions.  A negative result may occur with improper specimen collection / handling, submission of specimen other than nasopharyngeal swab, presence of viral mutation(s) within the areas targeted by this assay, and inadequate number of viral copies (<250 copies / mL). A negative result must be combined with clinical observations, patient history, and epidemiological information.  Fact Sheet for Patients:   RoadLapTop.co.za  Fact Sheet for Healthcare Providers: http://kim-miller.com/  This test is not yet approved or  cleared by the  [place] FDA and has been authorized for detection and/or diagnosis of SARS-CoV-2 by FDA under an Emergency Use Authorization (EUA).  This EUA will remain in effect (meaning this test can be used) for the duration of the COVID-19 declaration under Section 564(b)(1) of the Act, 21 U.S.C. section 360bbb-3(b)(1), unless the authorization is terminated or revoked sooner.  Performed at [facility], [address] [place], [place] [address]   Respiratory (~20 pathogens) panel by PCR     Status: None   Collection Time: 05/02/23  9:04 PM  Specimen: Nasopharyngeal Swab; Respiratory  Result Value Ref Range Status   Adenovirus NOT DETECTED NOT DETECTED Final   Coronavirus 229E NOT DETECTED NOT DETECTED Final    Comment: (NOTE) The Coronavirus on the Respiratory Panel, DOES NOT test for the novel  Coronavirus (2019 nCoV)    Coronavirus HKU1 NOT DETECTED NOT DETECTED Final   Coronavirus NL63 NOT DETECTED NOT DETECTED Final   Coronavirus OC43 NOT DETECTED NOT DETECTED Final   Metapneumovirus NOT DETECTED NOT DETECTED Final   Rhinovirus / Enterovirus NOT DETECTED NOT DETECTED Final   Influenza A NOT DETECTED NOT DETECTED Final   Influenza B NOT DETECTED NOT DETECTED Final   Parainfluenza Virus 1 NOT DETECTED NOT DETECTED Final   Parainfluenza Virus 2 NOT DETECTED NOT DETECTED Final   Parainfluenza Virus 3 NOT DETECTED NOT DETECTED Final   Parainfluenza Virus 4 NOT DETECTED NOT DETECTED Final   Respiratory Syncytial Virus NOT DETECTED NOT DETECTED Final   Bordetella pertussis NOT DETECTED NOT DETECTED Final   Bordetella Parapertussis NOT DETECTED NOT DETECTED Final   Chlamydophila pneumoniae NOT DETECTED NOT DETECTED Final   Mycoplasma pneumoniae NOT DETECTED NOT DETECTED Final    Comment: Performed at [facility], [address], [place], [place] [address]  Culture, blood (Routine X 2) w Reflex to ID Panel     Status: None   Collection Time: 05/02/23 10:05 PM    Specimen: BLOOD  Result Value Ref Range Status   Specimen Description BLOOD RIGHT WRIST  Final   Special Requests   Final    BOTTLES DRAWN AEROBIC AND ANAEROBIC Blood Culture adequate volume   Culture   Final    NO GROWTH 5 DAYS Performed at [facility], [address], [place], [place] [address]    Report Status 05/07/2023 FINAL  Final  Culture, blood (Routine X 2) w Reflex to ID Panel     Status: None   Collection Time: 05/02/23 10:05 PM   Specimen: BLOOD  Result Value Ref Range Status   Specimen Description BLOOD RIGHT HAND  Final   Special Requests   Final    BOTTLES DRAWN AEROBIC ONLY Blood Culture adequate volume   Culture   Final    NO GROWTH 5 DAYS Performed at [facility], [address], [place], [place] [address]    Report Status 05/07/2023 FINAL  Final    Coagulation Studies: No results for input(s): "LABPROT", "INR" in the last 72 hours.   Urinalysis: No results for input(s): "COLORURINE", "LABSPEC", "PHURINE", "GLUCOSEU", "HGBUR", "BILIRUBINUR", "KETONESUR", "PROTEINUR", "UROBILINOGEN", "NITRITE", "LEUKOCYTESUR" in the last 72 hours.  Invalid input(s): "APPERANCEUR"     Imaging: No results found.   Medications:      apixaban  2.5 mg Oral BID   Chlorhexidine Gluconate Cloth  6 each Topical Daily   feeding supplement  237 mL Oral TID BM   levothyroxine  25 mcg Oral Q0600   lidocaine  1 patch Transdermal Q24H   metoprolol succinate  100 mg Oral BID   multivitamin  1 tablet Oral QHS   pantoprazole  40 mg Oral Daily   polyethylene glycol  17 g Oral Daily   predniSONE  20 mg Oral Q breakfast   simvastatin  40 mg Oral Daily   sodium chloride flush  3 mL Intravenous Q12H   acetaminophen, bisacodyl, docusate sodium, ipratropium-albuterol, melatonin, ondansetron (ZOFRAN) IV, mouth rinse, polyethylene glycol, sodium chloride flush  Assessment/ Plan:  82 y.o. 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  Baseline creatinine of 2.5/GFR 25 on April 21, 2023 AKI is likely multifactorial.  Urinalysis from May 02, 2023 shows proteinuria, negative for blood.  0-5 RBCs.  0-5 WBCs.  Low likelihood for acute glomerulonephritis or acute interstitial nephritis. He did present with hypotension which may have led to ATN causing AKI which was subsequently exacerbated by IV contrast exposure from cardiac catheterization.  Creatinine continues to improve, 2.83.  Patient cleared to discharge from renal stents and will schedule follow-up appointment in our office in 1 to 2 weeks.  #Acute pericarditis Autoimmune workup negative from November 2024. Other differential includes viral myocarditis versus other causes. Currently getting treatment with oral steroids.   Low likelihood of uremic pericarditis in the setting of relatively recent onset of AKI Will continue on prednisone per cardiology recommendation.  #Hyperkalemia, corrected   #Proteinuria Patient is noted to have proteinuria even as outpatient. Previous autoimmune workup in June 2024 was negative. Urine protein to creatinine ratio 4.7 g In October 2024. He has known numerous simple and minimally complex renal cysts bilaterally.  CT abdomen from May 02, 2023 is negative for ureteral calculi, hydronephrosis.    LOS: 7 [employer] 12/3/20241:05 PM  [facility] [address] [place], [place] [phone]

## 2023-05-10 ENCOUNTER — Inpatient Hospital Stay: Payer: Medicare Other

## 2023-05-10 ENCOUNTER — Inpatient Hospital Stay: Payer: Medicare Other | Attending: Oncology

## 2023-05-10 DIAGNOSIS — E785 Hyperlipidemia, unspecified: Secondary | ICD-10-CM | POA: Diagnosis not present

## 2023-05-11 ENCOUNTER — Telehealth: Payer: Self-pay

## 2023-05-11 ENCOUNTER — Other Ambulatory Visit: Payer: Self-pay | Admitting: Family Medicine

## 2023-05-11 NOTE — Transitions of Care (Post Inpatient/ED Visit) (Signed)
   05/11/2023  Name: [name] MRN: [id number] DOB: 09/22/1940  Today's TOC FU Call Status: Today's TOC FU Call Status:: Unsuccessful Call (1st Attempt) Unsuccessful Call (1st Attempt) Date: 05/11/23  Attempted to reach the patient regarding the most recent Inpatient/ED visit.  Follow Up Plan: Additional outreach attempts will be made to reach the patient to complete the Transitions of Care (Post Inpatient/ED visit) call.   Signature Karena Addison, LPN [facility] Nurse Health Advisor Direct Dial [phone]

## 2023-05-12 NOTE — Telephone Encounter (Signed)
Requested Prescriptions  Pending Prescriptions Disp Refills   levothyroxine (SYNTHROID) 25 MCG tablet [Pharmacy Med Name: LEVOTHYROXINE SODIUM 25 MCG TAB] 90 tablet     Sig: TAKE 1 TABLET EVERY DAY ON EMPTY STOMACHWITH A GLASS OF WATER AT LEAST 30-60 MINBEFORE BREAKFAST     Endocrinology:  Hypothyroid Agents Failed - 05/11/2023 10:58 AM      Failed - TSH in normal range and within 360 days    TSH  Date Value Ref Range Status  05/02/2023 4.949 (H) 0.350 - 4.500 uIU/mL Final    Comment:    Performed by a 3rd Generation assay with a functional sensitivity of <=0.01 uIU/mL. Performed at [facility], [address] [place], [place] [address]   09/13/2022 7.200 (H) 0.450 - 4.500 uIU/mL Final         Passed - Valid encounter within last 12 months    Recent Outpatient Visits           1 month ago Vertigo   [facility] [place], [place] P, DO   2 months ago Diarrhea, unspecified type   [place] [facility] [place], Megan P, DO   3 months ago Weakness of left leg   [place] [place], Sherran Needs, NP   5 months ago Upper respiratory tract infection, unspecified type   [place] [place], Megan P, DO   6 months ago Hyperkalemia   [place] [facility] [place], Oralia Rud, DO       Future Appointments             In 1 week Carlos Levering, NP [place] [facility] [place]   In 5 months Deirdre Evener, MD [facility]             simvastatin (ZOCOR) 40 MG tablet [Pharmacy Med Name: SIMVASTATIN 40 MG TAB] 90 tablet 3    Sig: TAKE ONE TABLET BY MOUTH EVERY DAY     Cardiovascular:  Antilipid - Statins Failed - 05/11/2023 10:58 AM      Failed - Lipid Panel in normal range within the last 12 months    Cholesterol, Total  Date Value Ref Range Status  09/13/2022 102 100 - 199 mg/dL Final   Cholesterol  Date Value Ref Range Status   05/02/2023 84 0 - 200 mg/dL Final   Cholesterol Piccolo, Waived  Date Value Ref Range Status  04/14/2016 106 <200 mg/dL Final    Comment:                            Desirable                <200                         Borderline High      200- 239                         High                     >239    LDL Chol Calc (NIH)  Date Value Ref Range Status  09/13/2022 49 0 - 99 mg/dL Final   LDL Cholesterol  Date Value Ref Range Status  05/02/2023 47 0 - 99 mg/dL Final    Comment:  Total Cholesterol/HDL:CHD Risk Coronary Heart Disease Risk Table                     Men   Women  1/2 Average Risk   3.4   3.3  Average Risk       5.0   4.4  2 X Average Risk   9.6   7.1  3 X Average Risk  23.4   11.0        Use the calculated Patient Ratio above and the CHD Risk Table to determine the patient's CHD Risk.        ATP III CLASSIFICATION (LDL):  <100     mg/dL   Optimal  425-956  mg/dL   Near or Above                    Optimal  130-159  mg/dL   Borderline  387-564  mg/dL   High  >332     mg/dL   Very High Performed at [facility], [address] [place], [place] [address]    HDL  Date Value Ref Range Status  05/02/2023 23 (L) >40 mg/dL Final  41/66/0630 37 (L) >39 mg/dL Final   Triglycerides  Date Value Ref Range Status  05/02/2023 68 <150 mg/dL Final   Triglycerides Piccolo,Waived  Date Value Ref Range Status  04/14/2016 99 <150 mg/dL Final    Comment:                            Normal                   <150                         Borderline High     150 - 199                         High                200 - 499                         Very High                >499          Passed - Patient is not pregnant      Passed - Valid encounter within last 12 months    Recent Outpatient Visits           1 month ago Vertigo   [place] [facility] [place], Megan P, DO   2 months ago Diarrhea, unspecified type   [place]  [facility] [place], Megan P, DO   3 months ago Weakness of left leg   [place] [facility] [place], Sherran Needs, NP   5 months ago Upper respiratory tract infection, unspecified type   [place] [facility] [place], Megan P, DO   6 months ago Hyperkalemia    [facility] [place], Oralia Rud, DO       Future Appointments             In 1 week [place], Gavin Pound, NP  [facility] at [place]   In 5 months Deirdre Evener, MD [facility]

## 2023-05-12 NOTE — Telephone Encounter (Signed)
Requested medication (s) are due for refill today: yes  Requested medication (s) are on the active medication list: yes  Last refill:  01/04/23 #90  Future visit scheduled: no  Notes to clinic:  abnormal lab work   Requested Prescriptions  Pending Prescriptions Disp Refills   levothyroxine (SYNTHROID) 25 MCG tablet [Pharmacy Med Name: LEVOTHYROXINE SODIUM 25 MCG TAB] 90 tablet     Sig: TAKE 1 TABLET EVERY DAY ON EMPTY STOMACHWITH A GLASS OF WATER AT LEAST 30-60 MINBEFORE BREAKFAST     Endocrinology:  Hypothyroid Agents Failed - 05/11/2023 10:58 AM      Failed - TSH in normal range and within 360 days    TSH  Date Value Ref Range Status  05/02/2023 4.949 (H) 0.350 - 4.500 uIU/mL Final    Comment:    Performed by a 3rd Generation assay with a functional sensitivity of <=0.01 uIU/mL. Performed at [facility], [address] [place], [place] [address]   09/13/2022 7.200 (H) 0.450 - 4.500 uIU/mL Final         Passed - Valid encounter within last 12 months    Recent Outpatient Visits           1 month ago Vertigo   [place] [facility] [place], [place] P, DO   2 months ago Diarrhea, unspecified type   [facility] [place], Megan P, DO   3 months ago Weakness of left leg   [place] [facility] [place], Sherran Needs, NP   5 months ago Upper respiratory tract infection, unspecified type   [place] [facility] [place], Megan P, DO   6 months ago Hyperkalemia   [place] [facility] [name], DO       Future Appointments             In 1 week Carlos Levering, NP  [facility] at [place]   In 5 months Deirdre Evener, MD [facility]            Signed Prescriptions Disp Refills   simvastatin (ZOCOR) 40 MG tablet 90 tablet 3    Sig: TAKE ONE TABLET BY MOUTH EVERY DAY     Cardiovascular:  Antilipid - Statins Failed - 05/11/2023  10:58 AM      Failed - Lipid Panel in normal range within the last 12 months    Cholesterol, Total  Date Value Ref Range Status  09/13/2022 102 100 - 199 mg/dL Final   Cholesterol  Date Value Ref Range Status  05/02/2023 84 0 - 200 mg/dL Final   Cholesterol Piccolo, Waived  Date Value Ref Range Status  04/14/2016 106 <200 mg/dL Final    Comment:                            Desirable                <200                         Borderline High      200- 239                         High                     >239    LDL Chol Calc (  NIH)  Date Value Ref Range Status  09/13/2022 49 0 - 99 mg/dL Final   LDL Cholesterol  Date Value Ref Range Status  05/02/2023 47 0 - 99 mg/dL Final    Comment:           Total Cholesterol/HDL:CHD Risk Coronary Heart Disease Risk Table                     Men   Women  1/2 Average Risk   3.4   3.3  Average Risk       5.0   4.4  2 X Average Risk   9.6   7.1  3 X Average Risk  23.4   11.0        Use the calculated Patient Ratio above and the CHD Risk Table to determine the patient's CHD Risk.        ATP III CLASSIFICATION (LDL):  <100     mg/dL   Optimal  119-147  mg/dL   Near or Above                    Optimal  130-159  mg/dL   Borderline  829-562  mg/dL   High  >130     mg/dL   Very High Performed at [facility], [address] [place], [place] [address]    HDL  Date Value Ref Range Status  05/02/2023 23 (L) >40 mg/dL Final  46/96/2952 37 (L) >39 mg/dL Final   Triglycerides  Date Value Ref Range Status  05/02/2023 68 <150 mg/dL Final   Triglycerides Piccolo,Waived  Date Value Ref Range Status  04/14/2016 99 <150 mg/dL Final    Comment:                            Normal                   <150                         Borderline High     150 - 199                         High                200 - 499                         Very High                >499          Passed - Patient is not pregnant      Passed - Valid  encounter within last 12 months    Recent Outpatient Visits           1 month ago Vertigo   [place] [facility] [place], Megan P, DO   2 months ago Diarrhea, unspecified type   [facility] [place], Megan P, DO   3 months ago Weakness of left leg   [place] [facility] [place], Sherran Needs, NP   5 months ago Upper respiratory tract infection, unspecified type   [facility] [place], Megan P, DO   6 months ago Hyperkalemia    [facility] [place], [place], DO  Future Appointments             In 1 week [place], Gavin Pound, NP [employer] [facility] [place]   In 5 months Deirdre Evener, MD [facility]

## 2023-05-15 DIAGNOSIS — N041 Nephrotic syndrome with focal and segmental glomerular lesions: Secondary | ICD-10-CM | POA: Diagnosis not present

## 2023-05-15 DIAGNOSIS — N281 Cyst of kidney, acquired: Secondary | ICD-10-CM | POA: Diagnosis not present

## 2023-05-15 DIAGNOSIS — D631 Anemia in chronic kidney disease: Secondary | ICD-10-CM | POA: Diagnosis not present

## 2023-05-15 DIAGNOSIS — I129 Hypertensive chronic kidney disease with stage 1 through stage 4 chronic kidney disease, or unspecified chronic kidney disease: Secondary | ICD-10-CM | POA: Diagnosis not present

## 2023-05-15 DIAGNOSIS — N1832 Chronic kidney disease, stage 3b: Secondary | ICD-10-CM | POA: Diagnosis not present

## 2023-05-15 DIAGNOSIS — N179 Acute kidney failure, unspecified: Secondary | ICD-10-CM | POA: Diagnosis not present

## 2023-05-15 DIAGNOSIS — N184 Chronic kidney disease, stage 4 (severe): Secondary | ICD-10-CM | POA: Diagnosis not present

## 2023-05-16 DIAGNOSIS — M1712 Unilateral primary osteoarthritis, left knee: Secondary | ICD-10-CM | POA: Diagnosis not present

## 2023-05-16 NOTE — Transitions of Care (Post Inpatient/ED Visit) (Signed)
05/16/2023  Name: [name] MRN: [id number] DOB: 1940/12/13  Today's TOC FU Call Status: Today's TOC FU Call Status:: Successful TOC FU Call Completed Unsuccessful Call (1st Attempt) Date: 05/11/23 [facility] FU Call Complete Date: 05/16/23 Patient's Name and Date of Birth confirmed.  Transition Care Management Follow-up Telephone Call Date of Discharge: 05/10/23 Discharge Facility: [facility]) Type of Discharge: Inpatient Admission Primary Inpatient Discharge Diagnosis:: depression How have you been since you were released from the hospital?: Same Any questions or concerns?: Yes  Items Reviewed: Did you receive and understand the discharge instructions provided?: Yes Medications obtained,verified, and reconciled?: Yes (Medications Reviewed) Any new allergies since your discharge?: No Dietary orders reviewed?: NA Do you have support at home?: Yes People in Home: spouse  Medications Reviewed Today: Medications Reviewed Today     Reviewed by Karena Addison, LPN (Licensed Practical Nurse) on 05/16/23 at 1240  Med List Status: <None>   Medication Order Taking? Sig Documenting Provider Last Dose Status Informant  acetaminophen (TYLENOL) 500 MG tablet [id number] No Take 1,000 mg by mouth every 6 (six) hours as needed for mild pain (pain score 1-3). [provider] Past Week Active Self, Spouse/Significant Other  apixaban (ELIQUIS) 2.5 MG TABS tablet [id number]  Take 1 tablet (2.5 mg total) by mouth 2 (two) times daily. Marrion Coy, MD  Active   azelastine (ASTELIN) 0.1 % nasal spray [id number] No Place 1 spray into both nostrils 2 (two) times daily. Use in each nostril as directed  Patient taking differently: Place 1 spray into both nostrils 2 (two) times daily as needed for rhinitis or allergies. Use in each nostril as directed   Particia Nearing, PA-C Taking Active Spouse/Significant Other           Med Note Raquel Sarna Apr 06, 2023 12:23 PM) On hand  Calcium Citrate-Vitamin D (CITRACAL + D PO) [id number] No Take 1 tablet by mouth in the morning. [provider] 05/02/2023 Active Spouse/Significant Other  Cholecalciferol (D3-1000 PO) [id number] No Take 1,000 Units by mouth in the morning. [provider] 05/02/2023 Active Spouse/Significant Other  DULoxetine (CYMBALTA) 20 MG capsule [id number] No Take 1 capsule (20 mg total) by mouth at bedtime. TAKE 1 CAPSULE BY MOUTH EVERY DAY Poggi, Excell Seltzer, MD 05/02/2023 Active Spouse/Significant Other  gabapentin (NEURONTIN) 300 MG capsule [id number]  Take 1 capsule (300 mg total) by mouth 2 (two) times daily. Marrion Coy, MD  Active   ketoconazole (NIZORAL) 2 % cream [id number] No Apply twice daily to affected area on buttocks until clear Deirdre Evener, MD 05/02/2023 Active Spouse/Significant Other  levothyroxine (SYNTHROID) 25 MCG tablet [id number]  TAKE 1 TABLET EVERY DAY ON EMPTY STOMACHWITH A GLASS OF WATER AT LEAST 30-60 MINBEFORE BREAKFAST Johnson, Megan P, DO  Active   lidocaine (LIDODERM) 5 % [id number] No Place 1 patch onto the skin daily. Remove & Discard patch within 12 hours or as directed by MD  Patient taking differently: Place 1 patch onto the skin daily as needed. Remove & Discard patch within 12 hours or as directed by MD   [name], DO Past Week Active Spouse/Significant Other  meclizine (ANTIVERT) 25 MG tablet [id number] No TAKE 1 TABLET BY MOUTH THREE TIMES DAILYAS NEEDED FOR DIZZINESS Cannady, Jolene T, NP Past Month Active Spouse/Significant Other  metoprolol succinate (TOPROL-XL) 100 MG 24 hr tablet [id number]  Take 1 tablet (100 mg total) by mouth 2 (two) times daily. Take with or  immediately following a meal. Marrion Coy, MD  Active   Multiple Vitamin (MULITIVITAMIN WITH MINERALS) TABS [id number] No Take 1 tablet by mouth in the morning. [provider] 05/02/2023 Active Spouse/Significant Other  omeprazole (PRILOSEC) 40 MG capsule  [id number] No Take 1 capsule (40 mg total) by mouth at bedtime. Christena Flake, MD 05/02/2023 Active Spouse/Significant Other  predniSONE (DELTASONE) 20 MG tablet [id number]  Take 0.5 tablets (10 mg total) by mouth daily with breakfast for 14 days. Marrion Coy, MD  Active   simvastatin (ZOCOR) 40 MG tablet [id number]  TAKE ONE TABLET BY MOUTH EVERY DAY Laural Benes, Megan P, DO  Active   sodium bicarbonate 650 MG tablet [id number]  Take 1 tablet (650 mg total) by mouth 2 (two) times daily. Marrion Coy, MD  Active   tamsulosin [facility]) 0.4 MG CAPS capsule [id number] No Take 2 capsules (0.8 mg total) by mouth every evening. Christena Flake, MD 05/02/2023 Active Spouse/Significant Other  zinc gluconate 50 MG tablet [id number] No Take 50 mg by mouth in the morning. [provider] 05/02/2023 Active Spouse/Significant Other            Home Care and Equipment/Supplies: Were Home Health Services Ordered?: NA Has Agency set up a time to come to your home?: No Any new equipment or medical supplies ordered?: NA  Functional Questionnaire: Do you need assistance with bathing/showering or dressing?: No Do you need assistance with meal preparation?: No Do you need assistance with eating?: No Do you have difficulty maintaining continence: No Do you need assistance with getting out of bed/getting out of a chair/moving?: No Do you have difficulty managing or taking your medications?: No  Follow up appointments reviewed: PCP Follow-up appointment confirmed?: Yes Date of PCP follow-up appointment?: 05/22/23 Follow-up Provider: [facility] Follow-up appointment confirmed?: Yes Date of Specialist follow-up appointment?: 05/16/23 Follow-Up Specialty Provider:: [facility] Do you need transportation to your follow-up appointment?: No Do you understand care options if your condition(s) worsen?: Yes-patient verbalized understanding    SIGNATURE Karena Addison, LPN [facility] Nurse Health  Advisor Direct Dial [phone]

## 2023-05-16 NOTE — Progress Notes (Unsigned)
Cardiology Clinic Note   Date: 05/16/2023 ID: SHANEN SANTAROSA, DOB Sep 28, 1940, MRN [id number]  Primary Cardiologist:  Julien Nordmann, MD  Patient Profile    [name] is a 82 y.o. male who presents to the clinic today for ***    Past medical history significant for: Nonobstructive CAD. LHC 05/02/2023 (STEMI): Mild nonobstructive CAD.  Mildly elevated left ventricular filling pressure.  Bedside ultrasound with mildly reduced LV EF with global hypokinesis and small pericardial effusion.  Query pericarditis/myocarditis. Chronic HFmrEF. Echo 05/03/2023: EF 40 to 45%.  Global hypokinesis.  Mild LVH.  Indeterminate diastolic parameters.  Mildly reduced RV function.  Mildly elevated PA pressure.  Moderate LAE.  Mild MR.  Mild to moderate TR.  Dilated IVC, RA pressure 15 mmHg. Limited echo 05/07/2023: EF 45 to 50%.  Global hypokinesis.  Indeterminate diastolic parameters.  Severe LAE.  Moderate RAE.  Small pericardial effusion slightly larger than prior echo 05/03/2023.  No evidence of cardiac tamponade.  Mild MR.  Mild calcification of aortic valve without aortic stenosis.  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  Dilated IVC, RA pressure 8 mmHg. PAF. Onset November 2024 in the setting of STEMI. Pericarditis. PAD. Stroke. Hypertension. Hyperlipidemia. Lipid panel 05/02/2023: LDL 47, HDL 23, TG 68, total 84. LPa 05/03/2023: 22.8. GERD. 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  In summary, was first evaluated by Dr. Mariah Milling on 12/30/2015 as a self-referral for hypotension and lightheadedness.  Patient's orthostasis was attributed to dehydration secondary to working in the sun and not hydrating appropriately and exacerbated by high dose Benicar.  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     History of Present Illness    [name] is followed by Dr. Mariah Milling for the above outlined history.   Patient  presented to [facility] ED on 05/02/2023 via EMS for generalized weakness.  STEMI was called in the field.  Upon arrival to ED patient complained of intermittent chest pain associated with shortness of breath and upper abdominal pain radiating to his back.  He was initially hypotensive with EMS and fluids were given.  Initial ED labs showed hemoglobin 7, potassium 5.5, creatinine 4.52, BUN 63, BNP 349.9. He was taken emergently to the Cath Lab which showed mild nonobstructive CAD.  Bedside echo demonstrated mildly reduced EF with global hypokinesis and small pericardial effusion.  Repeat EKG showed A-fib with RVR.  He was started on amiodarone infusion.  PRBC ordered for target hemoglobin >8.  Sed rate 127>> 109>> 74>> 65.  CRP 25.9>> 5.  Treatment for pericarditis limited secondary to CKD making him a poor candidate for NSAIDs or colchicine.  Started on 05/09/2023 on prednisone, Toprol and low-dose Eliquis  Today, patient ***  Pericarditis/myocarditis Echo 05/07/2023 showed EF 45 to 50%, global hypokinesis, small pericardial effusion.  Unable to treat with NSAIDs or colchicine secondary to CKD.  Sed rate and CRP trending down.  Patient*** -Reduce prednisone on 12/19 to 5 mg for 5 days. ***  PAF Onset November 2024 in the setting of STEMI and pericarditis. Patient *** EKG *** No spontaneous bleeding concerns.  -Continue Toprol, Eliquis. Appropriate Eliquis dose.    Nonobstructive CAD Patient presented to [facility] ED on 11/26 with STEMI. LHC showed mild nonobstructive CAD. Patient *** -Continue Toprol, simvastatin. Not on antiplatelet secondary to Eliquis.   Chronic HFmrEF Echo December 20204 showed EF 45-50%, global hypokinesis. Patient *** Euvolemic and well compensated on exam.  -  Continue Toprol. GDMT limited secondary to CKD. Diuretic not indicated.   Hypertension BP today *** -Continue Toprol.   Hyperlipidemia LDL November 2024 47, at goal.  -Continue simvastatin.    ROS: All other systems  reviewed and are otherwise negative except as noted in History of Present Illness.  Studies Reviewed       ***  Risk Assessment/Calculations    {Does this patient have ATRIAL FIBRILLATION?[phone]} No BP recorded.  {Refresh Note OR Click here to enter BP  :1}***        Physical Exam    VS:  There were no vitals taken for this visit. , BMI There is no height or weight on file to calculate BMI.  GEN: Well nourished, well developed, in no acute distress. Neck: No JVD or carotid bruits. Cardiac: *** RRR. No murmurs. No rubs or gallops.   Respiratory:  Respirations regular and unlabored. Clear to auscultation without rales, wheezing or rhonchi. GI: Soft, nontender, nondistended. Extremities: Radials/DP/PT 2+ and equal bilaterally. No clubbing or cyanosis. No edema ***  Skin: Warm and dry, no rash. Neuro: Strength intact.  Assessment & Plan   ***  Disposition: ***     {Are you ordering a CV Procedure (e.g. stress test, cath, DCCV, TEE, etc)?   Press F2        :[id number]}   Signed, Etta Grandchild. Terina Mcelhinny, DNP, NP-C

## 2023-05-19 ENCOUNTER — Ambulatory Visit: Payer: Medicare Other | Attending: Student | Admitting: Student

## 2023-05-22 ENCOUNTER — Emergency Department
Admission: EM | Admit: 2023-05-22 | Discharge: 2023-05-22 | Discharge status: Against Medical Advice | Payer: Medicare Other | Attending: Student | Admitting: Student

## 2023-05-22 ENCOUNTER — Inpatient Hospital Stay: Payer: Medicare Other | Admitting: Family Medicine

## 2023-05-22 ENCOUNTER — Other Ambulatory Visit: Payer: Self-pay

## 2023-05-22 ENCOUNTER — Emergency Department: Payer: Medicare Other

## 2023-05-22 DIAGNOSIS — D631 Anemia in chronic kidney disease: Secondary | ICD-10-CM | POA: Diagnosis not present

## 2023-05-22 DIAGNOSIS — R0602 Shortness of breath: Secondary | ICD-10-CM | POA: Diagnosis not present

## 2023-05-22 DIAGNOSIS — I309 Acute pericarditis, unspecified: Secondary | ICD-10-CM | POA: Diagnosis not present

## 2023-05-22 DIAGNOSIS — E875 Hyperkalemia: Secondary | ICD-10-CM | POA: Diagnosis not present

## 2023-05-22 DIAGNOSIS — M7989 Other specified soft tissue disorders: Secondary | ICD-10-CM | POA: Insufficient documentation

## 2023-05-22 DIAGNOSIS — Z5321 Procedure and treatment not carried out due to patient leaving prior to being seen by health care provider: Secondary | ICD-10-CM | POA: Diagnosis not present

## 2023-05-22 DIAGNOSIS — N179 Acute kidney failure, unspecified: Secondary | ICD-10-CM | POA: Diagnosis not present

## 2023-05-22 DIAGNOSIS — N184 Chronic kidney disease, stage 4 (severe): Secondary | ICD-10-CM | POA: Diagnosis not present

## 2023-05-22 DIAGNOSIS — R2243 Localized swelling, mass and lump, lower limb, bilateral: Secondary | ICD-10-CM | POA: Diagnosis not present

## 2023-05-22 DIAGNOSIS — N041 Nephrotic syndrome with focal and segmental glomerular lesions: Secondary | ICD-10-CM | POA: Diagnosis not present

## 2023-05-22 LAB — CBC WITH DIFFERENTIAL/PLATELET
Abs Immature Granulocytes: 1.19 10*3/uL — ABNORMAL HIGH (ref 0.00–0.07)
Basophils Absolute: 0.1 10*3/uL (ref 0.0–0.1)
Basophils Relative: 1 %
Eosinophils Absolute: 0 10*3/uL (ref 0.0–0.5)
Eosinophils Relative: 0 %
HCT: 33.3 % — ABNORMAL LOW (ref 39.0–52.0)
Hemoglobin: 9.7 g/dL — ABNORMAL LOW (ref 13.0–17.0)
Immature Granulocytes: 9 %
Lymphocytes Relative: 4 %
Lymphs Abs: 0.5 10*3/uL — ABNORMAL LOW (ref 0.7–4.0)
MCH: 28.6 pg (ref 26.0–34.0)
MCHC: 29.1 g/dL — ABNORMAL LOW (ref 30.0–36.0)
MCV: 98.2 fL (ref 80.0–100.0)
Monocytes Absolute: 1.4 10*3/uL — ABNORMAL HIGH (ref 0.1–1.0)
Monocytes Relative: 10 %
Neutro Abs: 10.4 10*3/uL — ABNORMAL HIGH (ref 1.7–7.7)
Neutrophils Relative %: 76 %
Platelets: 347 10*3/uL (ref 150–400)
RBC: 3.39 MIL/uL — ABNORMAL LOW (ref 4.22–5.81)
RDW: 18.2 % — ABNORMAL HIGH (ref 11.5–15.5)
Smear Review: NORMAL
WBC: 13.6 10*3/uL — ABNORMAL HIGH (ref 4.0–10.5)
nRBC: 0 % (ref 0.0–0.2)

## 2023-05-22 LAB — BASIC METABOLIC PANEL
Anion gap: 12 (ref 5–15)
BUN: 64 mg/dL — ABNORMAL HIGH (ref 8–23)
CO2: 22 mmol/L (ref 22–32)
Calcium: 8.3 mg/dL — ABNORMAL LOW (ref 8.9–10.3)
Chloride: 111 mmol/L (ref 98–111)
Creatinine, Ser: 2.61 mg/dL — ABNORMAL HIGH (ref 0.61–1.24)
GFR, Estimated: 24 mL/min — ABNORMAL LOW (ref >60)
Glucose, Bld: 119 mg/dL — ABNORMAL HIGH (ref 70–99)
Potassium: 5.3 mmol/L — ABNORMAL HIGH (ref 3.5–5.1)
Sodium: 145 mmol/L (ref 135–145)

## 2023-05-22 LAB — BRAIN NATRIURETIC PEPTIDE: B Natriuretic Peptide: 168.4 pg/mL — ABNORMAL HIGH (ref 0.0–100.0)

## 2023-05-22 LAB — TROPONIN I (HIGH SENSITIVITY): Troponin I (High Sensitivity): 10 ng/L (ref <18)

## 2023-05-22 NOTE — ED Triage Notes (Signed)
Pt here for bilateral leg swelling and right hand swelling. Had shoulder surgery three weeks ago. Wife states swelling is not significantly worse since d/c from hospital. Has had worsening SOB that comes and goes. Wife states he wheezes at night

## 2023-05-22 NOTE — ED Triage Notes (Signed)
Pt name called x2 for repeat VS and lab wrk. Pt did not answer.

## 2023-05-22 NOTE — ED Provider Triage Note (Signed)
Emergency Medicine Provider Triage Evaluation Note  [name] , a 82 y.o. male  was evaluated in triage.  Pt complains of leg swelling and right hand swelling. Patient had shoulder surgery recently. Also reports worsening SOB that comes and goes.  Review of Systems  Positive: Right hand swelling, bilateral leg swelling Negative:   Physical Exam  There were no vitals taken for this visit. Gen:   Awake, no distress   Resp:  Normal effort  MSK:   Moves extremities without difficulty  Other:    Medical Decision Making  Medically screening exam initiated at 2:17 PM.  Appropriate orders placed.  [name] was informed that the remainder of the evaluation will be completed by another provider, this initial triage assessment does not replace that evaluation, and the importance of remaining in the ED until their evaluation is complete.     Cameron Ali, PA-C 05/22/23 1419

## 2023-05-22 NOTE — ED Notes (Signed)
No answer when called several times from lobby 

## 2023-05-22 NOTE — ED Notes (Signed)
Pt wife came to the front desk stating that they had spoke to pts kidney doctor who advised them that it would be OK

## 2023-05-23 DIAGNOSIS — N049 Nephrotic syndrome with unspecified morphologic changes: Secondary | ICD-10-CM | POA: Diagnosis not present

## 2023-05-23 DIAGNOSIS — E870 Hyperosmolality and hypernatremia: Secondary | ICD-10-CM | POA: Diagnosis not present

## 2023-05-23 DIAGNOSIS — I3139 Other pericardial effusion (noninflammatory): Secondary | ICD-10-CM | POA: Diagnosis not present

## 2023-05-23 DIAGNOSIS — I130 Hypertensive heart and chronic kidney disease with heart failure and stage 1 through stage 4 chronic kidney disease, or unspecified chronic kidney disease: Secondary | ICD-10-CM | POA: Diagnosis not present

## 2023-05-23 DIAGNOSIS — R0603 Acute respiratory distress: Secondary | ICD-10-CM | POA: Diagnosis not present

## 2023-05-23 DIAGNOSIS — Z8673 Personal history of transient ischemic attack (TIA), and cerebral infarction without residual deficits: Secondary | ICD-10-CM | POA: Insufficient documentation

## 2023-05-23 DIAGNOSIS — N170 Acute kidney failure with tubular necrosis: Secondary | ICD-10-CM | POA: Diagnosis not present

## 2023-05-23 DIAGNOSIS — E7800 Pure hypercholesterolemia, unspecified: Secondary | ICD-10-CM | POA: Diagnosis not present

## 2023-05-23 DIAGNOSIS — R600 Localized edema: Secondary | ICD-10-CM | POA: Diagnosis not present

## 2023-05-23 DIAGNOSIS — I4892 Unspecified atrial flutter: Secondary | ICD-10-CM | POA: Insufficient documentation

## 2023-05-23 DIAGNOSIS — J90 Pleural effusion, not elsewhere classified: Secondary | ICD-10-CM | POA: Diagnosis not present

## 2023-05-23 DIAGNOSIS — E871 Hypo-osmolality and hyponatremia: Secondary | ICD-10-CM | POA: Diagnosis not present

## 2023-05-23 DIAGNOSIS — Z9889 Other specified postprocedural states: Secondary | ICD-10-CM | POA: Diagnosis not present

## 2023-05-23 DIAGNOSIS — R0902 Hypoxemia: Secondary | ICD-10-CM | POA: Diagnosis not present

## 2023-05-23 DIAGNOSIS — R000 Tachycardia, unspecified: Secondary | ICD-10-CM | POA: Diagnosis not present

## 2023-05-23 DIAGNOSIS — D631 Anemia in chronic kidney disease: Secondary | ICD-10-CM | POA: Diagnosis not present

## 2023-05-23 DIAGNOSIS — I2510 Atherosclerotic heart disease of native coronary artery without angina pectoris: Secondary | ICD-10-CM | POA: Diagnosis not present

## 2023-05-23 DIAGNOSIS — E039 Hypothyroidism, unspecified: Secondary | ICD-10-CM | POA: Diagnosis not present

## 2023-05-23 DIAGNOSIS — I429 Cardiomyopathy, unspecified: Secondary | ICD-10-CM | POA: Diagnosis not present

## 2023-05-23 DIAGNOSIS — J432 Centrilobular emphysema: Secondary | ICD-10-CM | POA: Diagnosis not present

## 2023-05-23 DIAGNOSIS — R4781 Slurred speech: Secondary | ICD-10-CM | POA: Diagnosis not present

## 2023-05-23 DIAGNOSIS — F39 Unspecified mood [affective] disorder: Secondary | ICD-10-CM | POA: Diagnosis not present

## 2023-05-23 DIAGNOSIS — J9811 Atelectasis: Secondary | ICD-10-CM | POA: Diagnosis not present

## 2023-05-23 DIAGNOSIS — I319 Disease of pericardium, unspecified: Secondary | ICD-10-CM | POA: Insufficient documentation

## 2023-05-23 DIAGNOSIS — I428 Other cardiomyopathies: Secondary | ICD-10-CM | POA: Diagnosis not present

## 2023-05-23 DIAGNOSIS — E875 Hyperkalemia: Secondary | ICD-10-CM | POA: Diagnosis not present

## 2023-05-23 DIAGNOSIS — D7581 Myelofibrosis: Secondary | ICD-10-CM | POA: Diagnosis not present

## 2023-05-23 DIAGNOSIS — D638 Anemia in other chronic diseases classified elsewhere: Secondary | ICD-10-CM | POA: Diagnosis not present

## 2023-05-23 DIAGNOSIS — R34 Anuria and oliguria: Secondary | ICD-10-CM | POA: Diagnosis not present

## 2023-05-23 DIAGNOSIS — R918 Other nonspecific abnormal finding of lung field: Secondary | ICD-10-CM | POA: Diagnosis not present

## 2023-05-23 DIAGNOSIS — I5021 Acute systolic (congestive) heart failure: Secondary | ICD-10-CM | POA: Diagnosis not present

## 2023-05-23 DIAGNOSIS — D471 Chronic myeloproliferative disease: Secondary | ICD-10-CM | POA: Diagnosis not present

## 2023-05-23 DIAGNOSIS — R29810 Facial weakness: Secondary | ICD-10-CM | POA: Diagnosis not present

## 2023-05-23 DIAGNOSIS — I4891 Unspecified atrial fibrillation: Secondary | ICD-10-CM | POA: Diagnosis not present

## 2023-05-23 DIAGNOSIS — I483 Typical atrial flutter: Secondary | ICD-10-CM | POA: Diagnosis not present

## 2023-05-23 DIAGNOSIS — N179 Acute kidney failure, unspecified: Secondary | ICD-10-CM | POA: Diagnosis not present

## 2023-05-23 DIAGNOSIS — N184 Chronic kidney disease, stage 4 (severe): Secondary | ICD-10-CM | POA: Diagnosis not present

## 2023-05-23 DIAGNOSIS — J9819 Other pulmonary collapse: Secondary | ICD-10-CM | POA: Diagnosis not present

## 2023-05-23 DIAGNOSIS — Z96611 Presence of right artificial shoulder joint: Secondary | ICD-10-CM | POA: Diagnosis not present

## 2023-05-23 DIAGNOSIS — Z7902 Long term (current) use of antithrombotics/antiplatelets: Secondary | ICD-10-CM | POA: Diagnosis not present

## 2023-05-23 DIAGNOSIS — N281 Cyst of kidney, acquired: Secondary | ICD-10-CM | POA: Diagnosis not present

## 2023-05-23 DIAGNOSIS — N189 Chronic kidney disease, unspecified: Secondary | ICD-10-CM | POA: Diagnosis not present

## 2023-05-23 DIAGNOSIS — I5020 Unspecified systolic (congestive) heart failure: Secondary | ICD-10-CM | POA: Diagnosis not present

## 2023-05-23 DIAGNOSIS — I959 Hypotension, unspecified: Secondary | ICD-10-CM | POA: Diagnosis not present

## 2023-05-23 DIAGNOSIS — Z7901 Long term (current) use of anticoagulants: Secondary | ICD-10-CM | POA: Diagnosis not present

## 2023-05-23 DIAGNOSIS — Z1152 Encounter for screening for COVID-19: Secondary | ICD-10-CM | POA: Diagnosis not present

## 2023-05-23 DIAGNOSIS — R808 Other proteinuria: Secondary | ICD-10-CM | POA: Diagnosis not present

## 2023-05-23 DIAGNOSIS — R4701 Aphasia: Secondary | ICD-10-CM | POA: Diagnosis not present

## 2023-05-23 DIAGNOSIS — I6523 Occlusion and stenosis of bilateral carotid arteries: Secondary | ICD-10-CM | POA: Diagnosis not present

## 2023-05-23 DIAGNOSIS — E8770 Fluid overload, unspecified: Secondary | ICD-10-CM | POA: Diagnosis not present

## 2023-05-23 DIAGNOSIS — I5023 Acute on chronic systolic (congestive) heart failure: Secondary | ICD-10-CM | POA: Diagnosis not present

## 2023-05-23 DIAGNOSIS — I311 Chronic constrictive pericarditis: Secondary | ICD-10-CM | POA: Diagnosis not present

## 2023-05-23 DIAGNOSIS — I6782 Cerebral ischemia: Secondary | ICD-10-CM | POA: Diagnosis not present

## 2023-05-23 DIAGNOSIS — I5022 Chronic systolic (congestive) heart failure: Secondary | ICD-10-CM | POA: Diagnosis not present

## 2023-05-23 DIAGNOSIS — R0600 Dyspnea, unspecified: Secondary | ICD-10-CM | POA: Diagnosis not present

## 2023-05-24 ENCOUNTER — Inpatient Hospital Stay: Payer: Medicare Other | Admitting: Oncology

## 2023-05-24 ENCOUNTER — Other Ambulatory Visit (HOSPITAL_BASED_OUTPATIENT_CLINIC_OR_DEPARTMENT_OTHER): Payer: Self-pay

## 2023-05-24 ENCOUNTER — Inpatient Hospital Stay: Payer: Medicare Other

## 2023-05-24 ENCOUNTER — Other Ambulatory Visit: Payer: Self-pay

## 2023-05-24 DIAGNOSIS — E8770 Fluid overload, unspecified: Secondary | ICD-10-CM | POA: Diagnosis not present

## 2023-05-25 ENCOUNTER — Other Ambulatory Visit: Payer: Self-pay | Admitting: Dermatology

## 2023-05-25 DIAGNOSIS — E8770 Fluid overload, unspecified: Secondary | ICD-10-CM | POA: Diagnosis not present

## 2023-05-25 DIAGNOSIS — B356 Tinea cruris: Secondary | ICD-10-CM

## 2023-05-25 NOTE — Telephone Encounter (Signed)
Left message on voicemail to return my call.  

## 2023-05-29 NOTE — Telephone Encounter (Signed)
I called and spoke with patient, and he is currently in the hospital with heart complications. He stated that he did finish the original prescription for Lamisil, but he does still have the rash. Please advise.

## 2023-05-30 DIAGNOSIS — J90 Pleural effusion, not elsewhere classified: Secondary | ICD-10-CM | POA: Insufficient documentation

## 2023-06-02 ENCOUNTER — Other Ambulatory Visit: Payer: Self-pay | Admitting: Family Medicine

## 2023-06-02 DIAGNOSIS — I5022 Chronic systolic (congestive) heart failure: Secondary | ICD-10-CM | POA: Diagnosis not present

## 2023-06-02 DIAGNOSIS — R808 Other proteinuria: Secondary | ICD-10-CM | POA: Diagnosis not present

## 2023-06-02 DIAGNOSIS — D471 Chronic myeloproliferative disease: Secondary | ICD-10-CM | POA: Diagnosis not present

## 2023-06-02 DIAGNOSIS — I5020 Unspecified systolic (congestive) heart failure: Secondary | ICD-10-CM | POA: Diagnosis not present

## 2023-06-02 DIAGNOSIS — Z7901 Long term (current) use of anticoagulants: Secondary | ICD-10-CM | POA: Diagnosis not present

## 2023-06-02 DIAGNOSIS — D631 Anemia in chronic kidney disease: Secondary | ICD-10-CM | POA: Diagnosis not present

## 2023-06-02 DIAGNOSIS — I309 Acute pericarditis, unspecified: Secondary | ICD-10-CM | POA: Diagnosis not present

## 2023-06-02 DIAGNOSIS — Z8673 Personal history of transient ischemic attack (TIA), and cerebral infarction without residual deficits: Secondary | ICD-10-CM | POA: Diagnosis not present

## 2023-06-02 DIAGNOSIS — I440 Atrioventricular block, first degree: Secondary | ICD-10-CM | POA: Diagnosis not present

## 2023-06-02 DIAGNOSIS — I4892 Unspecified atrial flutter: Secondary | ICD-10-CM | POA: Diagnosis not present

## 2023-06-02 DIAGNOSIS — I3139 Other pericardial effusion (noninflammatory): Secondary | ICD-10-CM | POA: Diagnosis not present

## 2023-06-02 DIAGNOSIS — N049 Nephrotic syndrome with unspecified morphologic changes: Secondary | ICD-10-CM | POA: Diagnosis not present

## 2023-06-02 DIAGNOSIS — I509 Heart failure, unspecified: Secondary | ICD-10-CM | POA: Diagnosis not present

## 2023-06-02 DIAGNOSIS — M6250 Muscle wasting and atrophy, not elsewhere classified, unspecified site: Secondary | ICD-10-CM | POA: Diagnosis not present

## 2023-06-02 DIAGNOSIS — N400 Enlarged prostate without lower urinary tract symptoms: Secondary | ICD-10-CM | POA: Diagnosis not present

## 2023-06-02 DIAGNOSIS — I130 Hypertensive heart and chronic kidney disease with heart failure and stage 1 through stage 4 chronic kidney disease, or unspecified chronic kidney disease: Secondary | ICD-10-CM | POA: Diagnosis not present

## 2023-06-02 DIAGNOSIS — K219 Gastro-esophageal reflux disease without esophagitis: Secondary | ICD-10-CM | POA: Diagnosis not present

## 2023-06-02 DIAGNOSIS — F39 Unspecified mood [affective] disorder: Secondary | ICD-10-CM | POA: Diagnosis not present

## 2023-06-02 DIAGNOSIS — E8770 Fluid overload, unspecified: Secondary | ICD-10-CM | POA: Diagnosis not present

## 2023-06-02 DIAGNOSIS — R531 Weakness: Secondary | ICD-10-CM | POA: Diagnosis not present

## 2023-06-02 DIAGNOSIS — N189 Chronic kidney disease, unspecified: Secondary | ICD-10-CM | POA: Diagnosis not present

## 2023-06-02 DIAGNOSIS — D638 Anemia in other chronic diseases classified elsewhere: Secondary | ICD-10-CM | POA: Diagnosis not present

## 2023-06-02 DIAGNOSIS — Z87891 Personal history of nicotine dependence: Secondary | ICD-10-CM | POA: Diagnosis not present

## 2023-06-02 DIAGNOSIS — Z7401 Bed confinement status: Secondary | ICD-10-CM | POA: Diagnosis not present

## 2023-06-02 DIAGNOSIS — E039 Hypothyroidism, unspecified: Secondary | ICD-10-CM | POA: Diagnosis not present

## 2023-06-02 DIAGNOSIS — I483 Typical atrial flutter: Secondary | ICD-10-CM | POA: Diagnosis not present

## 2023-06-02 DIAGNOSIS — J90 Pleural effusion, not elsewhere classified: Secondary | ICD-10-CM | POA: Diagnosis not present

## 2023-06-02 DIAGNOSIS — N179 Acute kidney failure, unspecified: Secondary | ICD-10-CM | POA: Diagnosis not present

## 2023-06-02 DIAGNOSIS — E785 Hyperlipidemia, unspecified: Secondary | ICD-10-CM | POA: Diagnosis not present

## 2023-06-02 DIAGNOSIS — N184 Chronic kidney disease, stage 4 (severe): Secondary | ICD-10-CM | POA: Diagnosis not present

## 2023-06-07 NOTE — Telephone Encounter (Signed)
 Requested medication (s) are due for refill today: no  Requested medication (s) are on the active medication list: yes  Last refill:  05/09/23 at hospital discharge  Future visit scheduled: no  Notes to clinic:  was prescribed at hospital discharge   Requested Prescriptions  Pending Prescriptions Disp Refills   gabapentin  (NEURONTIN ) 400 MG capsule [Pharmacy Med Name: GABAPENTIN  400 MG CAP] 270 capsule     Sig: TAKE 1 CAPSULE BY MOUTH EVERY MORNING AND TAKE 2 CAPSULES AT BEDTIME     Neurology: Anticonvulsants - gabapentin  Failed - 06/07/2023  9:40 AM      Failed - Cr in normal range and within 360 days    Creatinine  Date Value Ref Range Status  10/19/2022 2.08 (H) 0.61 - 1.24 mg/dL Final  98/86/7983 8.90 0.60 - 1.30 mg/dL Final   Creatinine, Ser  Date Value Ref Range Status  05/22/2023 2.61 (H) 0.61 - 1.24 mg/dL Final         Passed - Completed PHQ-2 or PHQ-9 in the last 360 days      Passed - Valid encounter within last 12 months    Recent Outpatient Visits           2 months ago Vertigo   [place] [facility] [place], Megan P, DO   3 months ago Diarrhea, unspecified type   [place] [facility] [place], Megan P, DO   4 months ago Weakness of left leg   [place] [facility] [place], Hyla Givens, NP   6 months ago Upper respiratory tract infection, unspecified type   [place] [facility] [place], [place], DO   7 months ago Hyperkalemia   [place] [facility] Vicci Duwaine SQUIBB, DO       Future Appointments             In 4 months Hester Alm BROCKS, MD [facility]

## 2023-06-08 ENCOUNTER — Encounter: Payer: Self-pay | Admitting: Oncology

## 2023-06-08 NOTE — Telephone Encounter (Signed)
 [facility] Medication Refill Request  Last non-acute appointment needs to be within 12 months YES   Last office visit: 04/04/2023  Next office visit: Visit date not found

## 2023-06-08 NOTE — Telephone Encounter (Signed)
 I contact patient and he is still in the hospital. I advised him that once he is back at home and feeling better to contact us and we will send in a refill of the Terbinafine for him.

## 2023-06-09 ENCOUNTER — Other Ambulatory Visit: Payer: Self-pay | Admitting: Family Medicine

## 2023-06-12 ENCOUNTER — Encounter: Payer: Self-pay | Admitting: Family Medicine

## 2023-06-12 ENCOUNTER — Ambulatory Visit: Payer: Medicare Other | Admitting: Family Medicine

## 2023-06-12 ENCOUNTER — Telehealth: Payer: Self-pay

## 2023-06-12 VITALS — BP 116/70 | HR 102 | Wt 168.6 lb

## 2023-06-12 DIAGNOSIS — R300 Dysuria: Secondary | ICD-10-CM

## 2023-06-12 DIAGNOSIS — I4891 Unspecified atrial fibrillation: Secondary | ICD-10-CM | POA: Diagnosis not present

## 2023-06-12 DIAGNOSIS — F0781 Postconcussional syndrome: Secondary | ICD-10-CM

## 2023-06-12 DIAGNOSIS — R296 Repeated falls: Secondary | ICD-10-CM

## 2023-06-12 DIAGNOSIS — E039 Hypothyroidism, unspecified: Secondary | ICD-10-CM

## 2023-06-12 DIAGNOSIS — D7581 Myelofibrosis: Secondary | ICD-10-CM | POA: Diagnosis not present

## 2023-06-12 DIAGNOSIS — E038 Other specified hypothyroidism: Secondary | ICD-10-CM | POA: Diagnosis not present

## 2023-06-12 DIAGNOSIS — R350 Frequency of micturition: Secondary | ICD-10-CM

## 2023-06-12 DIAGNOSIS — R197 Diarrhea, unspecified: Secondary | ICD-10-CM

## 2023-06-12 DIAGNOSIS — M25531 Pain in right wrist: Secondary | ICD-10-CM

## 2023-06-12 LAB — MICROSCOPIC EXAMINATION

## 2023-06-12 LAB — URINALYSIS, ROUTINE W REFLEX MICROSCOPIC
Bilirubin, UA: NEGATIVE
Glucose, UA: NEGATIVE
Ketones, UA: NEGATIVE
Nitrite, UA: NEGATIVE
Specific Gravity, UA: 1.02 (ref 1.005–1.030)
Urobilinogen, Ur: 0.2 mg/dL (ref 0.2–1.0)
pH, UA: 5.5 (ref 5.0–7.5)

## 2023-06-12 NOTE — Patient Instructions (Signed)
 Visit Information  Thank you for taking time to visit with me today. Please don't hesitate to contact me if I can be of assistance to you before our next scheduled telephone appointment.  Our next appointment is by telephone on 06/20/23  at 10 am   Following is a copy of your care plan:   Goals Addressed             This Visit's Progress    TOC Care Plan       Current Barriers:  Chronic Disease Management support and education needs related to Atrial Fibrillation   RNCM Clinical Goal(s):  Patient will take all medications exactly as prescribed and will call provider for medication related questions as evidenced by no missed medication doses  attend all scheduled medical appointments: with PCP and Cardiology  as evidenced by no missed follow-up visits  continue to work with RN Care Manager to address care management and care coordination needs related to  Atrial Fibrillation as evidenced by adherence to CM Team Scheduled appointments through collaboration with RN Care manager, provider, and care team.   Interventions: Evaluation of current treatment plan related to  self management and patient's adherence to plan as established by provider  Transitions of Care:  New goal. Durable Medical Equipment (DME) needs assessed with patient/caregiver, reviewed with patient/caregiver, and orders reviewed Doctor Visits  - discussed the importance of doctor visits Post discharge activity limitations prescribed by provider reviewed  AFIB Interventions: (Status:  New goal.) Short Term Goal   Reviewed importance of adherence to anticoagulant exactly as prescribed Advised patient to discuss Medications  with provider Counseled on seeking medical attention after a head injury or if there is blood in the urine/stool Screening for signs and symptoms of depression related to chronic disease state  Assessed social determinant of health barriers  Patient Goals/Self-Care Activities: Participate in  Transition of Care Program/Attend TOC scheduled calls Take all medications as prescribed Attend all scheduled provider appointments Call pharmacy for medication refills 3-7 days in advance of running out of medications Attend church or other social activities Perform all self care activities independently  Perform IADL's (shopping, preparing meals, housekeeping, managing finances) independently Call provider office for new concerns or questions   Follow Up Plan:  Telephone follow up appointment with care management team member scheduled for:  06/20/23 @ 10:00am The patient has been provided with contact information for the care management team and has been advised to call with any health related questions or concerns.           Reviewed goals for care Patient/ Caregiver verbalizes understanding of instructions with the plan of care . The  Patient / Caregiver was encouraged to make informed decisions about care, actively participate in managing health conditions, and implement lifestyle changes as needed to promote independence and self-management of healthcare. SDOH screenings have been completed and addressed if indicted There are no reported barriers to care.    Follow-up Plan [facility] Case Management Nurse will provide follow-up and on-going assessment ,evaluation and education of disease processes, recommended interventions for both chronic and acute medical conditions ,  along with ongoing review of symptoms ,medication reviews / reconciliation during each weekly call . Any updates , inconsistencies, discrepancies or acute care concerns will be addressed and routed to the correct Practitioner if indicated   [facility]  Please call the care guide team at [phone]  if you need to cancel or reschedule your appointment . For scheduled calls -Three  attempts will be made to reach you -if the scheduled call is missed or  we are unable to reach the you after 3 attempts no  additional outreach attempts will be made and the TOC follow-up will be closed .   If you need to speak to a Nurse you may  call me directly at the number below or if I am unavailable,and  your need is urgent  please call the main [facility] number at [phone] and ask to speak with one of the [facility] ( Transition of Care )  Nurses  .                                                                               Additionally, If you experience worsening of your symptoms, develop shortness of breath, If you are experiencing a medical emergency,  develop suicidal or homicidal thoughts you must seek medical attention immediately by calling 911 or report to your local emergency department or urgent care.   If you have a non-emergency medical problem during routine business hours, please contact your provider's office and ask to speak with a nurse.       Please take the time to read instructions/literature along with the possible adverse reactions/side effects for all the Medicines that have been prescribed to you. Only take newly prescribed  Medications after you have completely understood and accept all the possible adverse reactions/side effects.   Do not take more than prescribed Medications for  Pain, Sleep and Anxiety. Do not drive when taking Pain medications or sleep aid/ insomnia  medications It is not advisable to combine anxiety, sleep and pain medications without talking with your primary care practitioner    If you are experiencing a Mental Health or Behavioral Health Crisis or need someone to talk to Please call the Suicide and Crisis Lifeline: 93 You may also call the [place]  National Suicide Prevention Lifeline: (904) 364-6401 or TTY: 802-225-5894 TTY (270)738-2070) to talk to a trained counselor.  You may call the Behavioral Health Crisis Line at 854-336-7192, at any time, 24 hours a day, 7 days a week- however If you are in danger or need immediate medical attention, call 911.   If you  would like help to quit smoking, call 1-800-QUIT-NOW ( (984) 716-6563) OR Espaol: 1-855-Djelo-Ya (8-144-664-6430) o para ms informacin haga clic aqu or Text READY to 799-599 to register via text.   Bari Mayans , BSN, RN Care Management Coordinator [place]   [facility] [email or web address] Direct Dial: [phone]

## 2023-06-12 NOTE — Progress Notes (Signed)
 BP 116/70   Pulse (!) 102   Wt 168 lb 9.6 oz (76.5 kg)   SpO2 100%   BMI 24.19 kg/m    Subjective:    Patient ID: [name], male    DOB: Nov 19, 1940, 83 y.o.   MRN: [id number]  HPI: [name] is a 83 y.o. male  Chief Complaint  Patient presents with   Hospitalization Follow-up   Transition of Care Hospital Follow up.   Hospital/Facility: [facility] D/C Physician: Dr. 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    apixaban  (ELIQUIS ) 2.5 MG TABS tablet Take 1 tablet (2.5 mg total) by mouth 2 (two) times daily.    azelastine  (ASTELIN ) 0.1 % nasal spray Place 1 spray into both nostrils 2 (two) times daily. Use in each nostril as directed (Patient taking differently: Place 1 spray into both nostrils 2 (two) times daily as needed for rhinitis or allergies. Use in each nostril as directed) 04/06/2023: On hand   Cholecalciferol (D3-1000 PO) Take 1,000 Units by mouth in the morning.    furosemide  (LASIX ) 40 MG tablet Take 40 mg by mouth daily.    gabapentin  (NEURONTIN ) 300 MG capsule Take 1 capsule (300 mg total) by mouth 2 (two) times daily.    melatonin 3 MG TABS tablet Take 3 mg by mouth at bedtime as needed (insomnia).    metoprolol  succinate (TOPROL -XL) 100 MG  24 hr tablet Take 1 tablet (100 mg total) by mouth 2 (two) times daily. Take with or immediately following a meal. 06/12/2023: Per spouse takes 25 mg    Multiple Vitamin (MULITIVITAMIN WITH MINERALS) TABS Take 1 tablet by mouth in the morning.    pantoprazole  (PROTONIX ) 40 MG tablet Take by mouth.    predniSONE  (DELTASONE ) 10 MG tablet Take 10 mg by mouth daily with breakfast. Take 3 tablets (30 mg total) by mouth daily for 7 days, THEN 2 tablets (20 mg total) daily for 7 days, THEN 1 and 1/2 tablets (15 mg total) daily for 7 days, THEN 1 tablet (10 mg total) daily for 7 days, THEN 1/2 tablet (5 mg total) daily for 7 days.    simvastatin  (ZOCOR ) 40 MG tablet TAKE ONE TABLET BY MOUTH EVERY DAY    tamsulosin  (FLOMAX ) 0.4 MG CAPS capsule Take 2 capsules (0.8 mg total) by mouth every evening.    [DISCONTINUED] levothyroxine  (SYNTHROID ) 25 MCG tablet TAKE 1 TABLET EVERY DAY ON EMPTY STOMACHWITH A GLASS OF WATER  AT LEAST 30-60 MINBEFORE BREAKFAST    acetaminophen  (TYLENOL ) 500 MG tablet Take 1,000 mg by mouth every 6 (six) hours as needed for mild pain (pain score 1-3). (Patient not taking: Reported on 06/12/2023)    Calcium  Citrate-Vitamin D  (CITRACAL + D PO) Take 1 tablet by mouth in the morning. (Patient not taking: Reported on 06/12/2023) 06/12/2023: Will clarify with PCP during visit today  DULoxetine  (CYMBALTA ) 20 MG capsule Take 1 capsule (20 mg total) by mouth at bedtime. TAKE 1 CAPSULE BY MOUTH EVERY DAY (Patient not taking: Reported on 06/12/2023)    FARXIGA  10 MG TABS tablet Take 10 mg by mouth daily. (Patient not taking: Reported on 06/12/2023)    ketoconazole  (NIZORAL ) 2 % cream Apply twice daily to affected area on buttocks until clear (Patient not taking: Reported on 06/12/2023)    lidocaine  (LIDODERM ) 5 % Place 1 patch onto the skin daily. Remove & Discard patch within 12 hours or as directed by MD (Patient taking differently: Place 1 patch onto the skin daily as needed. Remove & Discard patch within 12  hours or as directed by MD)    meclizine  (ANTIVERT ) 25 MG tablet TAKE 1 TABLET BY MOUTH THREE TIMES DAILYAS NEEDED FOR DIZZINESS (Patient not taking: Reported on 06/12/2023)    sodium bicarbonate  650 MG tablet Take 1 tablet (650 mg total) by mouth 2 (two) times daily. (Patient not taking: Reported on 06/12/2023)    zinc  gluconate 50 MG tablet Take 50 mg by mouth in the morning. (Patient not taking: Reported on 06/12/2023)    [DISCONTINUED] gabapentin  (NEURONTIN ) 400 MG capsule TAKE 1 CAPSULE BY MOUTH EVERY MORNING AND TAKE 2 CAPSULES AT BEDTIME (Patient taking differently: Take 300 mg by mouth at bedtime.)    [DISCONTINUED] omeprazole  (PRILOSEC) 40 MG capsule Take 1 capsule (40 mg total) by mouth at bedtime.    Facility-Administered Encounter Medications as of 06/12/2023  Medication   epoetin  alfa-epbx (RETACRIT ) injection 10,000 Units   epoetin  alfa-epbx (RETACRIT ) injection 40,000 Units  Per Hospitalist: Hospital Course:   REHAB:  - Patient completed PT and OT to maximize functional status with mobility and ADLs as well as prevention of joint contracture.  - Patient was discussed in weekly Interdisciplinary Team Conference.  HFmrEF (EF 45-50%) The patient was admitted HFmrEF exacerbation likely in the setting of tachyarrhythmia. Nephrology followed while in [facility] for AKI on CKD and coordinated intermittent diuresis. In regards to GDMT, metoprolol  was held in setting of amiodarone  use and use of SGLT2i, and ACE/ARB was limited by kidney function. Patient was discharged on 40mg  of lasix  daily.   Exudative Left Pleural Effusion The patient had a recent admission in late November with pericarditis and small pleural effusion which was shown to enlarge. A thoracentesis was performed on 12/26 was exudative in nature. Since has been weaned to RA.   A-flutter with Rapid Ventricular Response s/p DCCV 12/19 & 12/27  Admitted with typical atrial flutter with HR 130s as well as recent hx of atrial fibrillation  2/2 pericarditis. DCCV attempted 12/19 with return to NSR but converted back to a-flutter. s/p DCCV 12/27 with conversion to 1st degree AV block. Discharged on amiodarone  200 mg daily and apixaban  2.5mg  BID.  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 The patient received with IV steroids and will discharged on a prednisone  taper as above.  AKI on CKD4  Nephrotic Range Proteinuria  Admitted with Cr 2.53. Cr 2.8-3.0 from previous admission in November/December 2024. Nephrology consulted at [facility]. He has nephrotic range proteinuria but has not been able to be biopsied due to renal cysts. Cr this admission persistently in 3.5-3.9 range without evidence of renal failure. Will follow with nephrology outpatient.   Urinary retention Retaining urine ~ 500 ml. Foley placed 12/25. TOV 1/1 successful.  Insomnia  Prior history. Does not take medication at home for insomnia. Melatonin 3 mg nightly and restart gabapentin  300mg  nightly upon discharge.   Acute-onset L facial droop, R-sided weakness (transient, resolved) ? TIA vs. TME Hx CVA (2009) without residual deficits Initially admitted for acute-onset L facial droop with aphasia and R-sided weakness, reportedly recurrent and requiring multiple ED presentations. Reportedly with R shoulder weakness, acute onset 12/15 that has since worsened, but MRI 12/17 without acute intracranial abnormality. DDx includes deconditioning/MSK pain vs. recrudescence of previous CVA. PVL and Doppler U/S unremarkable.  Anemia of Chronic dx At baseline, 9-10. Hemoglobin dropped from 8.4 to 7.4. Received 8000u erythropoietin  06/08/2023 prior to discharge.  Chronic Problems Unspecified mood disorder - Continue home duloxetine  20 mg daily GERD- Protonix  Hypothyroidism- Presented  with TSH > 10 but normal range T4. Suspect may be due to poor absorption of Synthroid  given intestinal edema. Synthroid  dose increased from 25 mcg to 50 mcg daily. Continue Synthroid  50 mcg daily. BPH - Continue home tamsulosin  Renal cysts - Noted on CT imaging dating back to 2022, followed by outpatient nephrology. HLD - home simvastatin  equivalent 40mg  while inpatient.   Diagnostic Tests Reviewed: Echo, MRI brain, CT chest/abdomen/pelvis, renal US , CXR, labs  Disposition: Home after rehab  Consults: none  Discharge Instructions:  Follow up here and with cardiology  Disease/illness Education: Discussed today  Home Health/Community Services Discussions/Referrals: Ordered today  Establishment or re-establishment of referral orders for community resources: In place  Discussion with other health care providers: N/A  Assessment and Support of treatment regimen adherence:  Good  Appointments Coordinated with:  Patient, wife and son  Education for self-management, independent living, and ADLs: Discussed today  Has been doing OK since getting home. Has been feeling a little fuzzy thinking- has not been totally himself. Notes that he fell since he got home and hurt his R wrist- it's been swollen and mildly tender. He did PT at rehab, but isn't sure if he's to have any when he get home. Has not heard from any home health. He is otherwise feeling well with no other concerns or complaints at this time.   Relevant past medical, surgical, family and social history reviewed and updated as indicated. Interim medical history since our last visit reviewed. Allergies and medications reviewed and updated.  Review of Systems  Constitutional: Negative.   Cardiovascular:  Positive for leg swelling. Negative for chest pain and palpitations.  Musculoskeletal: Negative.   Skin: Negative.   Psychiatric/Behavioral:  Negative for agitation, behavioral problems, confusion, decreased concentration, dysphoric  mood, hallucinations, self-injury, sleep disturbance and suicidal ideas. The patient is not nervous/anxious and is not hyperactive.     Per HPI unless specifically indicated above     Objective:    BP 116/70   Pulse (!) 102   Wt 168 lb 9.6 oz (76.5 kg)   SpO2 100%   BMI 24.19 kg/m   Wt Readings from Last 3 Encounters:  06/12/23 168 lb 9.6 oz (76.5 kg)  05/07/23 197 lb 1.6 oz (89.4 kg)  04/21/23 191 lb 9.3 oz (86.9 kg)    Physical Exam Vitals and nursing note reviewed.  Constitutional:      General: He is not in acute distress.    Appearance: Normal appearance. He is not ill-appearing, toxic-appearing or diaphoretic.  HENT:     Head: Normocephalic and atraumatic.     Right Ear: External ear normal.     Left Ear: External ear normal.     Nose:  Nose normal.     Mouth/Throat:     Mouth: Mucous membranes are moist.     Pharynx: Oropharynx is clear.  Eyes:     General: No scleral icterus.       Right eye: No discharge.        Left eye: No discharge.     Extraocular Movements: Extraocular movements intact.     Conjunctiva/sclera: Conjunctivae normal.     Pupils: Pupils are equal, round, and reactive to light.  Cardiovascular:     Rate and Rhythm: Normal rate and regular rhythm.     Pulses: Normal pulses.     Heart sounds: Normal heart sounds. No murmur heard.    No friction rub. No gallop.  Pulmonary:     Effort: Pulmonary effort is normal. No respiratory distress.     Breath sounds: Normal breath sounds. No stridor. No wheezing, rhonchi or rales.  Chest:     Chest wall: No tenderness.  Musculoskeletal:        General: Swelling (R wrist) and tenderness present. Normal range of motion.     Cervical back: Normal range of motion and neck supple.  Skin:    General: Skin is warm and dry.     Capillary Refill: Capillary refill takes less than 2 seconds.     Coloration: Skin is not jaundiced or pale.     Findings: Bruising present. No erythema, lesion or rash.   Neurological:     General: No focal deficit present.     Mental Status: He is alert and oriented to person, place, and time. Mental status is at baseline.  Psychiatric:        Mood and Affect: Mood normal.        Behavior: Behavior normal.        Thought Content: Thought content normal.        Judgment: Judgment normal.     Results for orders placed or performed in visit on 06/12/23  Microscopic Examination   Collection Time: 06/12/23  3:54 PM   Urine  Result Value Ref Range   WBC, UA 6-10 (A) 0 - 5 /hpf   RBC, Urine 0-2 0 - 2 /hpf   Epithelial Cells (non renal) 0-10 0 - 10 /hpf   Bacteria, UA Moderate (A) None seen/Few  Urinalysis, Routine w reflex microscopic   Collection Time: 06/12/23  3:54 PM  Result Value Ref Range   Specific Gravity, UA 1.020 1.005 - 1.030   pH, UA 5.5 5.0 - 7.5   Color, UA Yellow Yellow   Appearance Ur Cloudy (A) Clear   Leukocytes,UA 2+ (A) Negative   Protein,UA 3+ (A) Negative/Trace   Glucose, UA Negative Negative   Ketones, UA Negative Negative   RBC, UA 2+ (A) Negative   Bilirubin, UA Negative Negative   Urobilinogen, Ur 0.2 0.2 - 1.0 mg/dL   Nitrite, UA Negative Negative   Microscopic Examination See below:   CBC with Differential/Platelet   Collection Time: 06/12/23  4:02 PM  Result Value Ref Range   WBC 14.6 (H) 3.4 - 10.8 x10E3/uL   RBC 2.91 (L) 4.14 - 5.80 x10E6/uL   Hemoglobin 8.3 (L) 13.0 - 17.7 g/dL   Hematocrit 73.5 (L) 62.4 - 51.0 %   MCV 91 79 - 97 fL   MCH 28.5 26.6 - 33.0 pg   MCHC 31.4 (L) 31.5 - 35.7 g/dL   RDW 82.8 (H) 88.3 - 84.5 %   Platelets 279 150 - 450 x10E3/uL  Neutrophils 86 Not Estab. %   Lymphs 4 Not Estab. %   Monocytes 6 Not Estab. %   Eos 0 Not Estab. %   Basos 0 Not Estab. %   Immature Cells Note    Neutrophils Absolute 12.8 (H) 1.4 - 7.0 x10E3/uL   Lymphocytes Absolute 0.6 (L) 0.7 - 3.1 x10E3/uL   Monocytes Absolute 0.9 0.1 - 0.9 x10E3/uL   EOS (ABSOLUTE) 0.0 0.0 - 0.4 x10E3/uL   Basophils  Absolute 0.0 0.0 - 0.2 x10E3/uL   Hematology Comments: Note:   Basic metabolic panel   Collection Time: 06/12/23  4:02 PM  Result Value Ref Range   Glucose 135 (H) 70 - 99 mg/dL   BUN 63 (H) 8 - 27 mg/dL   Creatinine, Ser 6.34 (H) 0.76 - 1.27 mg/dL   eGFR 16 (L) >40 fO/fpw/8.26   BUN/Creatinine Ratio 17 10 - 24   Sodium 145 (H) 134 - 144 mmol/L   Potassium 5.4 (H) 3.5 - 5.2 mmol/L   Chloride 107 (H) 96 - 106 mmol/L   CO2 21 20 - 29 mmol/L   Calcium  7.4 (L) 8.6 - 10.2 mg/dL  Immature Cells   Collection Time: 06/12/23  4:02 PM  Result Value Ref Range   Bands(Auto) Relative 2 Not Estab. %   Metamyelocytes 2 (H) 0 - 0 %  TSH   Collection Time: 06/12/23  4:51 PM  Result Value Ref Range   TSH 7.740 (H) 0.450 - 4.500 uIU/mL      Assessment & Plan:   Problem List Items Addressed This Visit       Cardiovascular and Mediastinum   Atrial fibrillation with rapid ventricular response (HCC) - Primary   S/P cardioversion. Continue to follow with cardiology. HR good today. Call with any concerns. Labs drawn today.       Relevant Orders   CBC with Differential/Platelet (Completed)   Basic metabolic panel (Completed)   Ambulatory referral to Home Health     Endocrine   Hypothyroidism   Rechecking labs today. Await results. Treat as needed.       Relevant Orders   Ambulatory referral to Home Health   TSH (Completed)   Other Visit Diagnoses       Right wrist pain       Will check x-ray. Await results.   Relevant Orders   DG Wrist Complete Right   Ambulatory referral to Home Health     Frequency of micturition       Checking labs today. Await results. Treat as needed.   Relevant Orders   Urinalysis, Routine w reflex microscopic (Completed)   Ambulatory referral to Home Health     Frequent falls       Will get him set up with home health. Referral placed today.   Relevant Orders   Ambulatory referral to Home Health     Postconcussive syndrome       Discussed brain rest  and avoiding falls. Continue to monitor.   Relevant Orders   Ambulatory referral to Home Health        Follow up plan: Return in about 3 weeks (around 07/03/2023).

## 2023-06-12 NOTE — Transitions of Care (Post Inpatient/ED Visit) (Signed)
 06/12/2023  Name: [name] MRN: [id number] DOB: Dec 08, 1940  Today's TOC FU Call Status: Today's TOC FU Call Status:: Successful TOC FU Call Completed TOC FU Call Complete Date: 06/12/23 Patient's Name and Date of Birth confirmed.  Transition Care Management Follow-up Telephone Call Date of Discharge: 06/09/23 Discharge Facility: Other [facility]) Name of Other (Non-[facility]) Discharge Facility: [facility] Type of Discharge: Inpatient Admission Primary Inpatient Discharge Diagnosis:: Atrial flutter, unspecified type How have you been since you were released from the hospital?: Better Any questions or concerns?: No  Items Reviewed: Did you receive and understand the discharge instructions provided?: Yes (Stated they were reviewed very well) Medications obtained,verified, and reconciled?: Yes (Medications Reviewed) (Did not receive Calcium  Carbonate) Any new allergies since your discharge?: No Dietary orders reviewed?:  (NAS) Type of Diet Ordered:: Heart Healthy Do you have support at home?: Yes People in Home: spouse, grandchild(ren) Name of Support/Comfort Primary Source: Wife Maceo, Son - Honor is staying with them ,Grandson -Justin  Medications Reviewed Today: Medications Reviewed Today     Reviewed by Willma Camelia CROME, RN (Registered Nurse) on 06/12/23 at 1109  Med List Status: <None>   Medication Order Taking? Sig Documenting Provider Last Dose Status Informant  acetaminophen  (TYLENOL ) 500 MG tablet [id number] Yes Take 1,000 mg by mouth every 6 (six) hours as needed for mild pain (pain score 1-3). [provider] Taking Active Self, Spouse/Significant Other  amiodarone  (PACERONE ) 200 MG tablet [id number] Yes Take 200 mg by mouth daily. [provider] Taking Active   apixaban  (ELIQUIS ) 2.5 MG TABS tablet [id number] Yes Take 1 tablet (2.5 mg total) by mouth 2 (two) times daily. Laurita Pillion, MD Taking Active   azelastine  (ASTELIN ) 0.1 % nasal spray  [id number] Yes Place 1 spray into both nostrils 2 (two) times daily. Use in each nostril as directed  Patient taking differently: Place 1 spray into both nostrils 2 (two) times daily as needed for rhinitis or allergies. Use in each nostril as directed   Stuart Vernell Norris, PA-C Taking Active Spouse/Significant Other           Med Note CLAUD MICHEAL ONEIDA [place] Apr 06, 2023 12:23 PM) On hand  Calcium  Citrate-Vitamin D  (CITRACAL + D PO) [id number] No Take 1 tablet by mouth in the morning.  Patient not taking: Reported on 06/12/2023   [provider] Not Taking Active Spouse/Significant Other           Med Note (Caidynce Muzyka L   Mon Jun 12, 2023 10:49 AM) Will clarify with PCP during visit today   Cholecalciferol (D3-1000 PO) [id number] Yes Take 1,000 Units by mouth in the morning. [provider] Taking Active Spouse/Significant Other  DULoxetine  (CYMBALTA ) 20 MG capsule [id number] Yes Take 1 capsule (20 mg total) by mouth at bedtime. TAKE 1 CAPSULE BY MOUTH EVERY DAY Poggi, Norleen PARAS, MD Taking Active Spouse/Significant Other  furosemide  (LASIX ) 40 MG tablet [id number] Yes Take 40 mg by mouth daily. [provider] Taking Active   gabapentin  (NEURONTIN ) 300 MG capsule [id number] Yes Take 1 capsule (300 mg total) by mouth 2 (two) times daily. Laurita Pillion, MD Taking Active   gabapentin  (NEURONTIN ) 400 MG capsule [id number] Yes TAKE 1 CAPSULE BY MOUTH EVERY MORNING AND TAKE 2 CAPSULES AT BEDTIME  Patient taking differently: Take 300 mg by mouth at bedtime.   Vicci, Megan P, DO Taking Active   ketoconazole  (NIZORAL ) 2 % cream [id number] No Apply twice daily to affected area on  buttocks until clear  Patient not taking: Reported on 06/12/2023   Hester Alm BROCKS, MD Not Taking Active Spouse/Significant Other  levothyroxine  (SYNTHROID ) 25 MCG tablet [id number] Yes TAKE 1 TABLET EVERY DAY ON EMPTY STOMACHWITH A GLASS OF WATER  AT LEAST 30-60 MINBEFORE BREAKFAST Johnson, Megan P, DO Taking  Active   lidocaine  (LIDODERM ) 5 % [id number] Yes Place 1 patch onto the skin daily. Remove & Discard patch within 12 hours or as directed by MD  Patient taking differently: Place 1 patch onto the skin daily as needed. Remove & Discard patch within 12 hours or as directed by MD   Vicci Duwaine SQUIBB, DO Taking Active Spouse/Significant Other  meclizine  (ANTIVERT ) 25 MG tablet [id number] Yes TAKE 1 TABLET BY MOUTH THREE TIMES DAILYAS NEEDED FOR DIZZINESS Cannady, Jolene T, NP Taking Active Spouse/Significant Other  melatonin 3 MG TABS tablet [id number] Yes Take 3 mg by mouth at bedtime as needed (insomnia). [provider] Taking Active   metoprolol  succinate (TOPROL -XL) 100 MG 24 hr tablet [id number] Yes Take 1 tablet (100 mg total) by mouth 2 (two) times daily. Take with or immediately following a meal. Laurita Pillion, MD Taking Active            Med Note DARRICK, Renee Erb L   Mon Jun 12, 2023 10:57 AM) Per spouse takes 25 mg   Multiple Vitamin (MULITIVITAMIN WITH MINERALS) TABS [id number]  Take 1 tablet by mouth in the morning. [provider]  Active Spouse/Significant Other  omeprazole  (PRILOSEC) 40 MG capsule [id number] Yes Take 1 capsule (40 mg total) by mouth at bedtime. Poggi, Norleen PARAS, MD Taking Active Spouse/Significant Other  predniSONE  (DELTASONE ) 10 MG tablet [id number] Yes Take 10 mg by mouth daily with breakfast. Take 3 tablets (30 mg total) by mouth daily for 7 days, THEN 2 tablets (20 mg total) daily for 7 days, THEN 1 and 1/2 tablets (15 mg total) daily for 7 days, THEN 1 tablet (10 mg total) daily for 7 days, THEN 1/2 tablet (5 mg total) daily for 7 days. [provider] Taking Active   simvastatin  (ZOCOR ) 40 MG tablet [id number] Yes TAKE ONE TABLET BY MOUTH EVERY DAY Vicci Duwaine SQUIBB, DO Taking Active   sodium bicarbonate  650 MG tablet [id number] No Take 1 tablet (650 mg total) by mouth 2 (two) times daily.  Patient not taking: Reported on 06/12/2023   Laurita Pillion, MD Not  Taking Active   tamsulosin  (FLOMAX ) 0.4 MG CAPS capsule [id number] Yes Take 2 capsules (0.8 mg total) by mouth every evening. Poggi, Norleen PARAS, MD Taking Active Spouse/Significant Other  zinc  gluconate 50 MG tablet [id number] No Take 50 mg by mouth in the morning.  Patient not taking: Reported on 06/12/2023   [provider] Not Taking Active Spouse/Significant Other            Home Care and Equipment/Supplies: Were Home Health Services Ordered?: Yes [facility] of [facility] Health Agency[facility]  he will be using [facility] Has Agency set up a time to come to your home?: Yes First Home Health Visit Date: 06/19/23 Any new equipment or medical supplies ordered?: No  Functional Questionnaire: Do you need assistance with bathing/showering or dressing?: No Do you need assistance with meal preparation?: No Do you need assistance with eating?: No Do you have difficulty maintaining continence: No Do you need assistance with getting out of bed/getting out of a chair/moving?: No (using walker) Do you have difficulty managing or taking your medications?: Yes (Spouse manages and set  up)  Follow up appointments reviewed: PCP Follow-up appointment confirmed?: Yes Date of PCP follow-up appointment?: 06/12/23 Follow-up Provider: Duwaine Louder Specialist [facility] Follow-up appointment confirmed?: Yes Date of Specialist follow-up appointment?: 07/04/23 Follow-Up Specialty Provider:: Cardiology Do you need transportation to your follow-up appointment?: No (spouse drives) Do you understand care options if your condition(s) worsen?: Yes-patient verbalized understanding  SDOH Interventions Today    Flowsheet Row Most Recent Value  SDOH Interventions   Food Insecurity Interventions Intervention Not Indicated  Housing Interventions Intervention Not Indicated  Transportation Interventions Intervention Not Indicated, Patient Resources (Friends/Family)  Utilities Interventions Intervention Not Indicated   Social Connections Interventions Intervention Not Indicated     Patient is at high risk for readmission and/or has history of  high utilization  Discussed VBCI  TOC program and weekly calls to patient to assess condition/status, medication management  and provide support/education as indicated . Patient/ Caregiver voiced understanding and is  agreeable to 30 day program     Routine follow-up and on-going assessment evaluation and education of disease processes, recommended interventions for both chronic and acute medical conditions , will occur during each weekly visit along with ongoing review of symptoms ,medication reviews and reconciliation. Any updates , inconsistencies, discrepancies or acute care concerns will be addressed and routed to the correct Practitioner if indicated   Based on current information and Insurance plan -Reviewed benefits available to patient, including details about eligibility options for care if any area of needs were identified.  Reviewed patients ability to access and / or navigating the benefits system..Amb Referral made if indicted , refer to orders section of note for details   Please refer to Care Plan for goals and interventions -Effectiveness of interventions, symptom management and outcomes will be evaluated  weekly during [facility] 30-day Program Outreach calls  . Any necessary  changes and updates to Care Plan will be completed episodically    Reviewed goals for care Patient verbalizes understanding of instructions and care plan provided. Patient was encouraged to make informed decisions about their care, actively participate in managing their health condition, and implement lifestyle changes as needed to promote independence and self-management of health care    The patient has been provided with contact information for the care management team and has been advised to call with any health-related questions or concerns.  Bari Mayans , BSN, RN Care Management  Coordinator [place]   [facility] [email or web address] Direct Dial: [phone]

## 2023-06-13 LAB — BASIC METABOLIC PANEL
BUN/Creatinine Ratio: 17 (ref 10–24)
BUN: 63 mg/dL — ABNORMAL HIGH (ref 8–27)
CO2: 21 mmol/L (ref 20–29)
Calcium: 7.4 mg/dL — ABNORMAL LOW (ref 8.6–10.2)
Chloride: 107 mmol/L — ABNORMAL HIGH (ref 96–106)
Creatinine, Ser: 3.65 mg/dL — ABNORMAL HIGH (ref 0.76–1.27)
Glucose: 135 mg/dL — ABNORMAL HIGH (ref 70–99)
Potassium: 5.4 mmol/L — ABNORMAL HIGH (ref 3.5–5.2)
Sodium: 145 mmol/L — ABNORMAL HIGH (ref 134–144)
eGFR: 16 mL/min/{1.73_m2} — ABNORMAL LOW (ref >59)

## 2023-06-13 LAB — TSH: TSH: 7.74 u[IU]/mL — ABNORMAL HIGH (ref 0.450–4.500)

## 2023-06-13 LAB — CBC WITH DIFFERENTIAL/PLATELET
Basophils Absolute: 0 10*3/uL (ref 0.0–0.2)
Basos: 0 %
EOS (ABSOLUTE): 0 10*3/uL (ref 0.0–0.4)
Eos: 0 %
Hematocrit: 26.4 % — ABNORMAL LOW (ref 37.5–51.0)
Hemoglobin: 8.3 g/dL — ABNORMAL LOW (ref 13.0–17.7)
Lymphocytes Absolute: 0.6 10*3/uL — ABNORMAL LOW (ref 0.7–3.1)
Lymphs: 4 %
MCH: 28.5 pg (ref 26.6–33.0)
MCHC: 31.4 g/dL — ABNORMAL LOW (ref 31.5–35.7)
MCV: 91 fL (ref 79–97)
Monocytes Absolute: 0.9 10*3/uL (ref 0.1–0.9)
Monocytes: 6 %
Neutrophils Absolute: 12.8 10*3/uL — ABNORMAL HIGH (ref 1.4–7.0)
Neutrophils: 86 %
Platelets: 279 10*3/uL (ref 150–450)
RBC: 2.91 x10E6/uL — ABNORMAL LOW (ref 4.14–5.80)
RDW: 17.1 % — ABNORMAL HIGH (ref 11.6–15.4)
WBC: 14.6 10*3/uL — ABNORMAL HIGH (ref 3.4–10.8)

## 2023-06-13 LAB — IMMATURE CELLS
Bands(Auto) Relative: 2 %
Metamyelocytes: 2 % — ABNORMAL HIGH (ref 0–0)

## 2023-06-13 NOTE — Telephone Encounter (Signed)
 Dc'd 06/12/23 D.Knight CMA  Requested Prescriptions  Refused Prescriptions Disp Refills   gabapentin  (NEURONTIN ) 400 MG capsule [Pharmacy Med Name: GABAPENTIN  400 MG CAP] 270 capsule 0    Sig: TAKE 1 CAPSULE BY MOUTH EVERY MORNING AND TAKE 2 CAPSULES AT BEDTIME     Neurology: Anticonvulsants - gabapentin  Failed - 06/13/2023  9:42 AM      Failed - Cr in normal range and within 360 days    Creatinine  Date Value Ref Range Status  10/19/2022 2.08 (H) 0.61 - 1.24 mg/dL Final  98/86/7983 8.90 0.60 - 1.30 mg/dL Final   Creatinine, Ser  Date Value Ref Range Status  06/12/2023 3.65 (H) 0.76 - 1.27 mg/dL Final         Passed - Completed PHQ-2 or PHQ-9 in the last 360 days      Passed - Valid encounter within last 12 months    Recent Outpatient Visits           Yesterday Atrial fibrillation with rapid ventricular response [facility])   [place] [facility] [place], Megan P, DO   2 months ago Vertigo   [place] [facility] [place], Megan P, DO   3 months ago Diarrhea, unspecified type   [facility] [place], Megan P, DO   4 months ago Weakness of left leg   [place] [facility] [place], Hyla Givens, NP   6 months ago Upper respiratory tract infection, unspecified type   [place] [facility] Vicci Duwaine SQUIBB, DO       Future Appointments             In 2 weeks Vicci, Duwaine SQUIBB, DO [place] [facility]   In 4 months Hester Alm BROCKS, MD [facility]

## 2023-06-15 DIAGNOSIS — N041 Nephrotic syndrome with focal and segmental glomerular lesions: Secondary | ICD-10-CM | POA: Diagnosis not present

## 2023-06-15 DIAGNOSIS — N184 Chronic kidney disease, stage 4 (severe): Secondary | ICD-10-CM | POA: Diagnosis not present

## 2023-06-18 ENCOUNTER — Other Ambulatory Visit: Payer: Self-pay | Admitting: Family Medicine

## 2023-06-18 DIAGNOSIS — E038 Other specified hypothyroidism: Secondary | ICD-10-CM

## 2023-06-18 MED ORDER — LEVOTHYROXINE SODIUM 50 MCG PO TABS: 50.0000 ug | ORAL_TABLET | Freq: Every day | ORAL | 0 refills | Status: DC

## 2023-06-19 ENCOUNTER — Encounter: Payer: Self-pay | Admitting: Family Medicine

## 2023-06-19 NOTE — Assessment & Plan Note (Signed)
 S/P cardioversion. Continue to follow with cardiology. HR good today. Call with any concerns. Labs drawn today.

## 2023-06-19 NOTE — Assessment & Plan Note (Signed)
 Rechecking labs today. Await results. Treat as needed.

## 2023-06-20 ENCOUNTER — Other Ambulatory Visit: Payer: Self-pay

## 2023-06-20 ENCOUNTER — Telehealth: Payer: Self-pay | Admitting: Oncology

## 2023-06-20 ENCOUNTER — Telehealth: Payer: Self-pay | Admitting: Cardiovascular Disease

## 2023-06-20 NOTE — Telephone Encounter (Signed)
 Patients wife called and wanted to get him scheduled. She said that he was in the hospital. She also said that he used to come in weekly for labs/ injections. I told her I would reach out to the team and call her back. Please advise

## 2023-06-20 NOTE — Patient Outreach (Signed)
 Care Management  Transitions of Care Program Transitions of Care Post-discharge week 2   06/20/2023 Name: [name] MRN: [id number] DOB: August 09, 1940  Subjective: [name] is a 83 y.o. year old male who is a primary care patient of Vicci Duwaine SQUIBB, DO. The Care Management team Engaged with patient's spouse ETTER Rily) by telephone to assess and address transitions of care needs.   Consent to Services:  Patient was given information about care management services, agreed to services, and gave verbal consent to participate.   Assessment:   Patient voices no new complaints Patient has not developed/ reported any new Medical issues / Dx or acute changes.- since last follow-up call for most recent  Hospital stay    12/27-1/3 / 2025 Per spouse Rily he is doing very well. He is regaining strength and endurance He completed his PCP follow-up appt. 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 He has Cardiology f/u 1/28. His has a strong and supportive family. No medication changes. He had labs at PCP appt. Results pending . He continues wth Outpatient therapy.  Medication reconciliation / review completed based on most recent discharge summary and EHR medication list. Confirmed patient is taking all newly prescribed medications as instructed and is aware of any changes to and / or dosage adjustments to medication regimen. Patient denies questions at this time  and reports no barriers to medication adherence  Patient educated on red flag s/s to watch for and was encouraged to report, any changes in baseline or  medication regimen,  changes in health status  /  well-being, safety concerns  or any new unmanaged side effects or symptoms not relieved with interventions  to PCP and / or the  [facility] Case Management team           SDOH Interventions    Flowsheet Row Telephone from 06/12/2023 in [place] [facility] Telephone from 04/25/2023 in [place] [facility] DEPARTMENT Office  Visit from 11/02/2021 in [facility] Clinical Support from 07/12/2021 in [facility] Office Visit from 10/18/2019 in [facility]  SDOH Interventions       Food Insecurity Interventions Intervention Not Indicated Intervention Not Indicated -- Intervention Not Indicated --  Housing Interventions Intervention Not Indicated Intervention Not Indicated -- Intervention Not Indicated --  Transportation Interventions Intervention Not Indicated, Patient Resources (Friends/Family) Intervention Not Indicated, Patient Resources (Friends/Family) -- Intervention Not Indicated --  Utilities Interventions Intervention Not Indicated Intervention Not Indicated -- -- --  Depression Interventions/Treatment  -- -- Medication, Currently on Treatment -- Medication  Financial Strain Interventions -- -- -- Intervention Not Indicated --  Physical Activity Interventions -- -- -- Intervention Not Indicated --  Stress Interventions -- -- -- Intervention Not Indicated --  Social Connections Interventions Intervention Not Indicated -- -- Intervention Not Indicated --     Medication reconciliation/ review completed based on most recent medication list in EHR; and in review of recent Provider follow-up appointments- confirmed patient obtained / is taking all newly prescribed medications as instructed and is aware of any changes to previous medications regimen including dosage adjustments. Patient self-Caregiver manages medications; denies questions/ concerns or barriers to medication adherence at this time.    Goals Addressed             This Visit's Progress    TOC Care Plan       Current Barriers:  Chronic Disease Management support and education needs related  to Atrial Fibrillation   RNCM Clinical Goal(s):  Patient will take all medications exactly as prescribed and will call provider for medication related questions as evidenced by no missed  medication doses  attend all scheduled medical appointments: with PCP and Cardiology  as evidenced by no missed follow-up visits  continue to work with RN Care Manager to address care management and care coordination needs related to  Atrial Fibrillation as evidenced by adherence to CM Team Scheduled appointments through collaboration with RN Care manager, provider, and care team.   Interventions: Evaluation of current treatment plan related to  self management and patient's adherence to plan as established by provider  Transitions of Care:  Goal on track:  Yes. Durable Medical Equipment (DME) needs assessed with patient/caregiver, reviewed with patient/caregiver, and orders reviewed Doctor Visits  - discussed the importance of doctor visits Health /OT/PT [place]  also seeing [place] [facility] Post discharge activity limitations prescribed by provider reviewed  AFIB Interventions: (Status:  Goal on track:  Yes.) Short Term Goal   Reviewed importance of adherence to anticoagulant exactly as prescribed Advised patient to discuss Medications  with provider Counseled on seeking medical attention after a head injury or if there is blood in the urine/stool Screening for signs and symptoms of depression related to chronic disease state  Assessed social determinant of health barriers  Patient Goals/Self-Care Activities: Participate in Transition of Care Program/Attend TOC scheduled calls Take all medications as prescribed Attend all scheduled provider appointments Call pharmacy for medication refills 3-7 days in advance of running out of medications Attend church or other social activities Perform all self care activities independently  Perform IADL's (shopping, preparing meals, housekeeping, managing finances) independently Call provider office for new concerns or questions   Follow Up Plan:  Telephone follow up appointment with care management team member scheduled for:  06/27/23 @ 11:00am The  patient has been provided with contact information for the care management team and has been advised to call with any health related questions or concerns.          Plan: Telephone follow up appointment with care management team member scheduled for:  06/27/23 @ 11:00am   Routine follow-up and on-going assessment evaluation and education of disease processes, recommended interventions for both chronic and acute medical conditions , will occur during each weekly visit along with ongoing review of symptoms ,medication reviews and reconciliation. Any updates , inconsistencies, discrepancies or acute care concerns will be addressed and routed to the correct Practitioner if indicated   Based on current information and Insurance plan -Reviewed benefits available to patient, including details about eligibility options for care if any area of needs were identified.  Reviewed patients ability to access and / or navigating the benefits system..Amb Referral made if indicted , refer to orders section of note for details   Please refer to Care Plan for goals and interventions -Effectiveness of interventions, symptom management and outcomes will be evaluated  weekly during [facility] 30-day Program Outreach calls  . Any necessary  changes and updates to Care Plan will be completed episodically    Reviewed goals for care Patient verbalizes understanding of instructions and care plan provided. Patient was encouraged to make informed decisions about their care, actively participate in managing their health condition, and implement lifestyle changes as needed to promote independence and self-management of health care    The patient has been provided with contact information for the care management team and has been advised to call with any health-related  questions or concerns.     Bari Mayans , BSN, RN Care Management Coordinator [place]   [facility] [email or web address] Direct Dial:  [phone]

## 2023-06-20 NOTE — Patient Instructions (Signed)
 Visit Information  Thank you for taking time to visit with me today. Please don't hesitate to contact me if I can be of assistance to you before our next scheduled telephone appointment.  Our next appointment is by telephone on 06/27/23 at 11:00am  Following is a copy of your care plan:   Goals Addressed             This Visit's Progress    TOC Care Plan       Current Barriers:  Chronic Disease Management support and education needs related to Atrial Fibrillation   RNCM Clinical Goal(s):  Patient will take all medications exactly as prescribed and will call provider for medication related questions as evidenced by no missed medication doses  attend all scheduled medical appointments: with PCP and Cardiology  as evidenced by no missed follow-up visits  continue to work with RN Care Manager to address care management and care coordination needs related to  Atrial Fibrillation as evidenced by adherence to CM Team Scheduled appointments through collaboration with RN Care manager, provider, and care team.   Interventions: Evaluation of current treatment plan related to  self management and patient's adherence to plan as established by provider  Transitions of Care:  Goal on track:  Yes. Durable Medical Equipment (DME) needs assessed with patient/caregiver, reviewed with patient/caregiver, and orders reviewed Doctor Visits  - discussed the importance of doctor visits Health /OT/PT [place]  also seeing [facility] Post discharge activity limitations prescribed by provider reviewed  AFIB Interventions: (Status:  Goal on track:  Yes.) Short Term Goal   Reviewed importance of adherence to anticoagulant exactly as prescribed Advised patient to discuss Medications  with provider Counseled on seeking medical attention after a head injury or if there is blood in the urine/stool Screening for signs and symptoms of depression related to chronic disease state  Assessed social determinant of  health barriers  Patient Goals/Self-Care Activities: Participate in Transition of Care Program/Attend TOC scheduled calls Take all medications as prescribed Attend all scheduled provider appointments Call pharmacy for medication refills 3-7 days in advance of running out of medications Attend church or other social activities Perform all self care activities independently  Perform IADL's (shopping, preparing meals, housekeeping, managing finances) independently Call provider office for new concerns or questions   Follow Up Plan:  Telephone follow up appointment with care management team member scheduled for:  06/27/23 @ 11:00am The patient has been provided with contact information for the care management team and has been advised to call with any health related questions or concerns.           Reviewed goals for care Patient/ Caregiver verbalizes understanding of instructions with the plan of care . The  Patient / Caregiver was encouraged to make informed decisions about care, actively participate in managing health conditions, and implement lifestyle changes as needed to promote independence and self-management of healthcare. SDOH screenings have been completed and addressed if indicted There are no reported barriers to care.    Follow-up Plan [facility] Case Management Nurse will provide follow-up and on-going assessment ,evaluation and education of disease processes, recommended interventions for both chronic and acute medical conditions ,  along with ongoing review of symptoms ,medication reviews / reconciliation during each weekly call . Any updates , inconsistencies, discrepancies or acute care concerns will be addressed and routed to the correct Practitioner if indicated   [facility]  Please call the care guide team at [phone]  if you need  to cancel or reschedule your appointment . For scheduled calls -Three attempts will be made to reach you -if the scheduled call  is missed or  we are unable to reach the you after 3 attempts no additional outreach attempts will be made and the TOC follow-up will be closed .   If you need to speak to a Nurse you may  call me directly at the number below or if I am unavailable,and  your need is urgent  please call the main [facility] number at [phone] and ask to speak with one of the [facility] ( Transition of Care )  Nurses  .                                                                               Additionally, If you experience worsening of your symptoms, develop shortness of breath, If you are experiencing a medical emergency,  develop suicidal or homicidal thoughts you must seek medical attention immediately by calling 911 or report to your local emergency department or urgent care.   If you have a non-emergency medical problem during routine business hours, please contact your provider's office and ask to speak with a nurse.       Please take the time to read instructions/literature along with the possible adverse reactions/side effects for all the Medicines that have been prescribed to you. Only take newly prescribed  Medications after you have completely understood and accept all the possible adverse reactions/side effects.   Do not take more than prescribed Medications for  Pain, Sleep and Anxiety. Do not drive when taking Pain medications or sleep aid/ insomnia  medications It is not advisable to combine anxiety, sleep and pain medications without talking with your primary care practitioner    If you are experiencing a Mental Health or Behavioral Health Crisis or need someone to talk to Please call the Suicide and Crisis Lifeline: 33 You may also call the [place]  National Suicide Prevention Lifeline: 219-682-7906 or TTY: (437)652-4471 TTY 613 449 6469) to talk to a trained counselor.  You may call the Behavioral Health Crisis Line at 567-176-3453, at any time, 24 hours a day, 7 days a week- however If you are in  danger or need immediate medical attention, call 911.   If you would like help to quit smoking, call 1-800-QUIT-NOW ( 615-741-8949) OR Espaol: 1-855-Djelo-Ya (8-144-664-6430) o para ms informacin haga clic aqu or Text READY to 799-599 to register via text.   Bari Mayans , BSN, RN Care Management Coordinator [place]   [facility] [email or web address] Direct Dial: [phone]

## 2023-06-20 NOTE — Telephone Encounter (Signed)
   Pre-operative Risk Assessment    Patient Name: NASON CONRADT  DOB: 1940-11-24 MRN: [id number]   Date of last office visit: 11/15/21 Date of next office visit: n/a   Request for Surgical Clearance    Procedure:  Renal Biopsy  Date of Surgery:  Clearance TBD                                Surgeon:  Dennise Capri, MD Surgeon's Group or Practice Name:  [facility] Phone number:  [phone] Fax number:  [phone]   Type of Clearance Requested:  Hold Eliquis  48hrs prior to procedure    Type of Anesthesia:  Not Indicated   Additional requests/questions:    Signed, Arsenio Celine GAILS   06/20/2023, 3:05 PM

## 2023-06-20 NOTE — Telephone Encounter (Signed)
 He can see lauren with [facility] and get retacrit next week. Conitnue H/H retacrit Q3 weeks and I will see him in 2 months

## 2023-06-21 ENCOUNTER — Ambulatory Visit
Admission: RE | Admit: 2023-06-21 | Discharge: 2023-06-21 | Disposition: A | Discharge status: Home / Self Care | Payer: Medicare Other | Source: Ambulatory Visit | Attending: Family Medicine

## 2023-06-21 ENCOUNTER — Other Ambulatory Visit: Payer: Self-pay | Admitting: *Deleted

## 2023-06-21 ENCOUNTER — Ambulatory Visit
Admission: RE | Admit: 2023-06-21 | Discharge: 2023-06-21 | Disposition: A | Discharge status: Home / Self Care | Payer: Medicare Other | Attending: Family Medicine | Admitting: Family Medicine

## 2023-06-21 DIAGNOSIS — M7989 Other specified soft tissue disorders: Secondary | ICD-10-CM | POA: Diagnosis not present

## 2023-06-21 DIAGNOSIS — M25531 Pain in right wrist: Secondary | ICD-10-CM | POA: Diagnosis not present

## 2023-06-21 DIAGNOSIS — M1811 Unilateral primary osteoarthritis of first carpometacarpal joint, right hand: Secondary | ICD-10-CM | POA: Diagnosis not present

## 2023-06-21 DIAGNOSIS — M19031 Primary osteoarthritis, right wrist: Secondary | ICD-10-CM | POA: Diagnosis not present

## 2023-06-21 DIAGNOSIS — D649 Anemia, unspecified: Secondary | ICD-10-CM

## 2023-06-21 DIAGNOSIS — D631 Anemia in chronic kidney disease: Secondary | ICD-10-CM

## 2023-06-21 DIAGNOSIS — M799 Soft tissue disorder, unspecified: Secondary | ICD-10-CM | POA: Diagnosis not present

## 2023-06-21 NOTE — Telephone Encounter (Signed)
   Name: [name]  DOB: 1941-04-10  MRN: [id number]  Primary Cardiologist: Belva Boyden, MD  Chart reviewed as part of pre-operative protocol coverage. Because of Dannell A Strohman's past medical history and time since last visit, he will require a follow-up in-office visit in order to better assess preoperative cardiovascular risk.    Recently hospitalized with A-fib RVR, and chest pain, with acute pericarditis.  Was to have follow-up appointment 1 week post discharge with Dr. Gollan but appointment not yet made.  Patient was discharged on 05/09/2023.  He will need to have recommendations concerning holding anticoagulation on that office visit.  Pre-op covering staff: - Please schedule appointment and call patient to inform them. If patient already had an upcoming appointment within acceptable timeframe, please add "pre-op clearance" to the appointment notes so provider is aware. - Please contact requesting surgeon's office via preferred method (i.e, phone, fax) to inform them of need for appointment prior to surgery.  This message will also be routed to pharmacy pool and/or Dr Gollan for input on holding apixaban  as requested below so that this information is available to the clearing provider at time of patient's appointment.   Friddie Jetty, NP  06/21/2023, 11:00 AM

## 2023-06-21 NOTE — Progress Notes (Signed)
 Labs put in for him when he gets inj.

## 2023-06-21 NOTE — Telephone Encounter (Signed)
 In office preop clearance appt now scheduled. Provided pt with address and agrees to appt date and time

## 2023-06-22 ENCOUNTER — Telehealth: Payer: Self-pay | Admitting: Family Medicine

## 2023-06-22 DIAGNOSIS — G4700 Insomnia, unspecified: Secondary | ICD-10-CM | POA: Diagnosis not present

## 2023-06-22 DIAGNOSIS — I5020 Unspecified systolic (congestive) heart failure: Secondary | ICD-10-CM | POA: Diagnosis not present

## 2023-06-22 DIAGNOSIS — I4891 Unspecified atrial fibrillation: Secondary | ICD-10-CM | POA: Diagnosis not present

## 2023-06-22 DIAGNOSIS — Z8673 Personal history of transient ischemic attack (TIA), and cerebral infarction without residual deficits: Secondary | ICD-10-CM | POA: Diagnosis not present

## 2023-06-22 DIAGNOSIS — E870 Hyperosmolality and hypernatremia: Secondary | ICD-10-CM | POA: Diagnosis not present

## 2023-06-22 DIAGNOSIS — F0781 Postconcussional syndrome: Secondary | ICD-10-CM | POA: Diagnosis not present

## 2023-06-22 DIAGNOSIS — I3139 Other pericardial effusion (noninflammatory): Secondary | ICD-10-CM | POA: Diagnosis not present

## 2023-06-22 DIAGNOSIS — R338 Other retention of urine: Secondary | ICD-10-CM | POA: Diagnosis not present

## 2023-06-22 DIAGNOSIS — K219 Gastro-esophageal reflux disease without esophagitis: Secondary | ICD-10-CM | POA: Diagnosis not present

## 2023-06-22 DIAGNOSIS — D7581 Myelofibrosis: Secondary | ICD-10-CM | POA: Diagnosis not present

## 2023-06-22 DIAGNOSIS — E039 Hypothyroidism, unspecified: Secondary | ICD-10-CM | POA: Diagnosis not present

## 2023-06-22 DIAGNOSIS — N281 Cyst of kidney, acquired: Secondary | ICD-10-CM | POA: Diagnosis not present

## 2023-06-22 DIAGNOSIS — N179 Acute kidney failure, unspecified: Secondary | ICD-10-CM | POA: Diagnosis not present

## 2023-06-22 DIAGNOSIS — Z7901 Long term (current) use of anticoagulants: Secondary | ICD-10-CM | POA: Diagnosis not present

## 2023-06-22 DIAGNOSIS — D631 Anemia in chronic kidney disease: Secondary | ICD-10-CM | POA: Diagnosis not present

## 2023-06-22 DIAGNOSIS — N401 Enlarged prostate with lower urinary tract symptoms: Secondary | ICD-10-CM | POA: Diagnosis not present

## 2023-06-22 DIAGNOSIS — R296 Repeated falls: Secondary | ICD-10-CM | POA: Diagnosis not present

## 2023-06-22 DIAGNOSIS — Z79890 Hormone replacement therapy (postmenopausal): Secondary | ICD-10-CM | POA: Diagnosis not present

## 2023-06-22 DIAGNOSIS — I4892 Unspecified atrial flutter: Secondary | ICD-10-CM | POA: Diagnosis not present

## 2023-06-22 DIAGNOSIS — E785 Hyperlipidemia, unspecified: Secondary | ICD-10-CM | POA: Diagnosis not present

## 2023-06-22 DIAGNOSIS — M25531 Pain in right wrist: Secondary | ICD-10-CM | POA: Diagnosis not present

## 2023-06-22 DIAGNOSIS — I130 Hypertensive heart and chronic kidney disease with heart failure and stage 1 through stage 4 chronic kidney disease, or unspecified chronic kidney disease: Secondary | ICD-10-CM | POA: Diagnosis not present

## 2023-06-22 DIAGNOSIS — N184 Chronic kidney disease, stage 4 (severe): Secondary | ICD-10-CM | POA: Diagnosis not present

## 2023-06-22 NOTE — Telephone Encounter (Signed)
Home Health Verbal Orders - Caller/Agency: Tharon Aquas  Callback Number: [phone] secure  Service Requested: Physical Therapy Frequency:   1w1 2w3 1w5  Also requesting approval for Occupational Therapy and skilled nursing  Any new concerns about the patient? Yes Had an Xray of the right wrist, if PCP has the results please review with patient. He has swelling in his wrist and legs (hence why wants the nurse involved)  Also just had shoulder surgery.

## 2023-06-23 NOTE — Telephone Encounter (Signed)
Spoke with Kennyth Arnold from [facility] to provide verbal OK orders per Dr Laural Benes. Stacy verbalized understanding.

## 2023-06-23 NOTE — Telephone Encounter (Signed)
OK for verbal orders, OK for occupational and nursing

## 2023-06-26 ENCOUNTER — Encounter: Payer: Self-pay | Admitting: Family Medicine

## 2023-06-27 ENCOUNTER — Other Ambulatory Visit: Payer: Self-pay

## 2023-06-27 NOTE — Patient Outreach (Signed)
Care Management  Transitions of Care Program Transitions of Care Post-discharge week 3   06/27/2023 Name: [name] MRN: [id number] DOB: 1941-05-19  Subjective: [name] is a 83 y.o. year old male who is a primary care patient of Christian Carrow, DO. The Care Management team Engaged with patient Engaged with patient by telephone to assess and address transitions of care needs.   Consent to Services:  Patient was given information about care management services, agreed to services, and gave verbal consent to participate.   Assessment:   Patient/ Caregiver  voices no new complaints or concerns  and has not developed/ reported any new medical issues / Dx or acute changes. - since last follow-up call for most recent  Hospital stay   12/27-1/3 / 2025 Spike with spouse [name]. He is doing well. He does need 24/7 oversight. Her son [name] has moved in to assist . She is working  on scheduling appointments for ongoing lab work ( H/H) and follow-up Oncology. He was seen by PCP and recently had c/o Francis wrist pain X-rays negative fr acute Fx ( Osteoarthritis)  . She is using Nervive topical with effect at intervals Cardiology follow-up 1/28 Medication reconciliation / review completed based on most recent discharge summary and EHR medication list. Confirmed patient is taking all newly prescribed medications as instructed (any discrepancies are noted in review section)   Patient / Caregiver is aware of any changes to and / or  any dosage adjustments to medication regimen. Patient/ Caregiver denies questions at this time and reports no barriers to medication adherence        SDOH Interventions    Flowsheet Row Telephone from 06/12/2023 in [place] POPULATION HEALTH DEPARTMENT Telephone from 04/25/2023 in [place] POPULATION HEALTH DEPARTMENT Office Visit from 11/02/2021 in [facility] Clinical Support from 07/12/2021 in [facility] Office Visit  from 10/18/2019 in [facility]  SDOH Interventions       Food Insecurity Interventions Intervention Not Indicated Intervention Not Indicated -- Intervention Not Indicated --  Housing Interventions Intervention Not Indicated Intervention Not Indicated -- Intervention Not Indicated --  Transportation Interventions Intervention Not Indicated, Patient Resources (Friends/Family) Intervention Not Indicated, Patient Resources (Friends/Family) -- Intervention Not Indicated --  Utilities Interventions Intervention Not Indicated Intervention Not Indicated -- -- --  Depression Interventions/Treatment  -- -- Medication, Currently on Treatment -- Medication  Financial Strain Interventions -- -- -- Intervention Not Indicated --  Physical Activity Interventions -- -- -- Intervention Not Indicated --  Stress Interventions -- -- -- Intervention Not Indicated --  Social Connections Interventions Intervention Not Indicated -- -- Intervention Not Indicated --        Goals Addressed             This Visit's Progress    TOC Care Plan       Current Barriers:  Chronic Disease Management support and education needs related to Atrial Fibrillation   RNCM Clinical Goal(s):  Patient will take all medications exactly as prescribed and will call provider for medication related questions as evidenced by no missed medication doses  attend all scheduled medical appointments: with PCP and Cardiology  as evidenced by no missed follow-up visits- Cardiology 1/28 PCP 1/6 , Oncology Dr Smith Robert, pending follow-up  continue to work with RN Care Manager to address care management and care coordination needs related to  Atrial Fibrillation as evidenced by adherence to CM Team Scheduled appointments through  collaboration with Medical illustrator, provider, and care team.   Interventions: Evaluation of current treatment plan related to  self management and patient's adherence to plan as established by  provider  Transitions of Care:  Goal on track:  Yes. Durable Medical Equipment (DME) needs assessed with patient/caregiver, reviewed with patient/caregiver, and orders reviewed Doctor Visits  - discussed the importance of doctor visits Health /OT/PT [name]  also seeing [name] PT Post discharge activity limitations prescribed by provider reviewed  AFIB Interventions: (Status:  Goal on track:  Yes.) Short Term Goal   Reviewed importance of adherence to anticoagulant exactly as prescribed Advised patient to discuss Medications  with provider Counseled on seeking medical attention after a head injury or if there is blood in the urine/stool Screening for signs and symptoms of depression related to chronic disease state  Assessed social determinant of health barriers  Patient Goals/Self-Care Activities: Participate in Transition of Care Program/Attend TOC scheduled calls Take all medications as prescribed Attend all scheduled provider appointments Call pharmacy for medication refills 3-7 days in advance of running out of medications Attend church or other social activities Perform all self care activities independently  Perform IADL's (shopping, preparing meals, housekeeping, managing finances) independently Call provider office for new concerns or questions   Follow Up Plan:  Telephone follow up appointment with care management team member scheduled for:  06/27/23 @ 11:00am The patient has been provided with contact information for the care management team and has been advised to call with any health related questions or concerns.          Plan:  Patient / Caregiver educated on red flag s/s to watch for and was encouraged to report, any changes in baseline or  medication regimen,  changes in health status  /  well-being, safety concerns  or any new unmanaged side effects or symptoms not relieved with interventions  to PCP and / or the  [facility] Case Management team    Routine follow-up  and on-going assessment evaluation and education of disease processes, recommended interventions for both chronic and acute medical conditions , will occur during each weekly visit along with ongoing review of symptoms ,medication reviews and reconciliation. Any updates , inconsistencies, discrepancies or acute care concerns will be addressed and routed to the correct Practitioner if indicated   Based on current information and Insurance plan -Reviewed benefits available to patient, including details about eligibility options for care if any area of needs were identified.  Reviewed patients ability to access and / or navigating the benefits system..Amb Referral made if indicted , refer to orders section of note for details   Please refer to Care Plan for goals and interventions -Effectiveness of interventions, symptom management and outcomes will be evaluated  weekly during [facility] 30-day Program Outreach calls  . Any necessary  changes and updates to Care Plan will be completed episodically    Reviewed goals for care Patient verbalizes understanding of instructions and care plan provided. Patient was encouraged to make informed decisions about their care, actively participate in managing their health condition, and implement lifestyle changes as needed to promote independence and self-management of health care   The patient has been provided with contact information for the care management team and has been advised to call with any health-related questions or concerns. Follow up as indicated with Care Team , or sooner should any new problems arise.   The patient has been provided with contact information for the care management team and has  been advised to call with any health related questions or concerns.   Susa Loffler , BSN, RN [facility]   [facility] RN Care Manager Direct Dial [phone] Fax [phone] Website: [email or web address]

## 2023-06-29 ENCOUNTER — Inpatient Hospital Stay: Payer: Medicare Other

## 2023-06-29 ENCOUNTER — Other Ambulatory Visit: Payer: Self-pay

## 2023-06-29 ENCOUNTER — Inpatient Hospital Stay: Payer: Medicare Other | Attending: Oncology

## 2023-06-29 ENCOUNTER — Encounter: Payer: Self-pay | Admitting: Nurse Practitioner

## 2023-06-29 ENCOUNTER — Inpatient Hospital Stay (HOSPITAL_BASED_OUTPATIENT_CLINIC_OR_DEPARTMENT_OTHER): Payer: Medicare Other | Admitting: Nurse Practitioner

## 2023-06-29 VITALS — BP 140/70 | HR 67 | Temp 96.2°F | Wt 168.0 lb

## 2023-06-29 DIAGNOSIS — D649 Anemia, unspecified: Secondary | ICD-10-CM | POA: Diagnosis not present

## 2023-06-29 DIAGNOSIS — D7581 Myelofibrosis: Secondary | ICD-10-CM

## 2023-06-29 DIAGNOSIS — N184 Chronic kidney disease, stage 4 (severe): Secondary | ICD-10-CM

## 2023-06-29 DIAGNOSIS — N183 Chronic kidney disease, stage 3 unspecified: Secondary | ICD-10-CM

## 2023-06-29 DIAGNOSIS — I4891 Unspecified atrial fibrillation: Secondary | ICD-10-CM | POA: Diagnosis not present

## 2023-06-29 DIAGNOSIS — I129 Hypertensive chronic kidney disease with stage 1 through stage 4 chronic kidney disease, or unspecified chronic kidney disease: Secondary | ICD-10-CM | POA: Insufficient documentation

## 2023-06-29 DIAGNOSIS — Z79899 Other long term (current) drug therapy: Secondary | ICD-10-CM | POA: Diagnosis not present

## 2023-06-29 DIAGNOSIS — D631 Anemia in chronic kidney disease: Secondary | ICD-10-CM | POA: Diagnosis not present

## 2023-06-29 DIAGNOSIS — I130 Hypertensive heart and chronic kidney disease with heart failure and stage 1 through stage 4 chronic kidney disease, or unspecified chronic kidney disease: Secondary | ICD-10-CM | POA: Diagnosis not present

## 2023-06-29 DIAGNOSIS — I3139 Other pericardial effusion (noninflammatory): Secondary | ICD-10-CM | POA: Diagnosis not present

## 2023-06-29 DIAGNOSIS — I5020 Unspecified systolic (congestive) heart failure: Secondary | ICD-10-CM | POA: Diagnosis not present

## 2023-06-29 LAB — CBC WITH DIFFERENTIAL/PLATELET
Abs Immature Granulocytes: 0.5 10*3/uL — ABNORMAL HIGH (ref 0.00–0.07)
Basophils Absolute: 0 10*3/uL (ref 0.0–0.1)
Basophils Relative: 1 %
Eosinophils Absolute: 0 10*3/uL (ref 0.0–0.5)
Eosinophils Relative: 0 %
HCT: 19.9 % — ABNORMAL LOW (ref 39.0–52.0)
Hemoglobin: 6.2 g/dL — CL (ref 13.0–17.0)
Immature Granulocytes: 8 %
Lymphocytes Relative: 14 %
Lymphs Abs: 0.8 10*3/uL (ref 0.7–4.0)
MCH: 28.8 pg (ref 26.0–34.0)
MCHC: 31.2 g/dL (ref 30.0–36.0)
MCV: 92.6 fL (ref 80.0–100.0)
Monocytes Absolute: 0.8 10*3/uL (ref 0.1–1.0)
Monocytes Relative: 14 %
Neutro Abs: 3.8 10*3/uL (ref 1.7–7.7)
Neutrophils Relative %: 63 %
Platelets: 253 10*3/uL (ref 150–400)
RBC: 2.15 MIL/uL — ABNORMAL LOW (ref 4.22–5.81)
RDW: 20.9 % — ABNORMAL HIGH (ref 11.5–15.5)
Smear Review: NORMAL
WBC: 6 10*3/uL (ref 4.0–10.5)
nRBC: 0 % (ref 0.0–0.2)

## 2023-06-29 LAB — COMPREHENSIVE METABOLIC PANEL
ALT: 22 U/L (ref 0–44)
AST: 15 U/L (ref 15–41)
Albumin: 2.9 g/dL — ABNORMAL LOW (ref 3.5–5.0)
Alkaline Phosphatase: 67 U/L (ref 38–126)
Anion gap: 11 (ref 5–15)
BUN: 74 mg/dL — ABNORMAL HIGH (ref 8–23)
CO2: 23 mmol/L (ref 22–32)
Calcium: 6.7 mg/dL — ABNORMAL LOW (ref 8.9–10.3)
Chloride: 106 mmol/L (ref 98–111)
Creatinine, Ser: 3.07 mg/dL — ABNORMAL HIGH (ref 0.61–1.24)
GFR, Estimated: 20 mL/min — ABNORMAL LOW (ref >60)
Glucose, Bld: 102 mg/dL — ABNORMAL HIGH (ref 70–99)
Potassium: 4.5 mmol/L (ref 3.5–5.1)
Sodium: 140 mmol/L (ref 135–145)
Total Bilirubin: 0.7 mg/dL (ref 0.0–1.2)
Total Protein: 5.4 g/dL — ABNORMAL LOW (ref 6.5–8.1)

## 2023-06-29 LAB — PREPARE RBC (CROSSMATCH)

## 2023-06-29 MED ORDER — EPOETIN ALFA-EPBX 10000 UNIT/ML IJ SOLN
10000.0000 [IU] | Freq: Once | INTRAMUSCULAR | Status: AC
  Administered 2023-06-29: 10000 [IU] via SUBCUTANEOUS
  Filled 2023-06-29: qty 1

## 2023-06-29 MED ORDER — EPOETIN ALFA-EPBX 40000 UNIT/ML IJ SOLN
40000.0000 [IU] | Freq: Once | INTRAMUSCULAR | Status: AC
Start: 2023-06-29 — End: 2023-06-29
  Administered 2023-06-29: 40000 [IU] via SUBCUTANEOUS
  Filled 2023-06-29: qty 1

## 2023-06-29 NOTE — Progress Notes (Signed)
Hematology/Oncology Consult Note [facility]  [phone] [phone]  Patient Care Team: [name], DO as PCP - General (Family Medicine) Mariah Milling Tollie Pizza, MD as PCP - Cardiology (Cardiology) Jolayne Panther, MD as Referring Physician (Hematology) Sherrie George, MD as Consulting Physician (Ophthalmology) Micki Riley, MD as Consulting Physician (Neurology) Jerilee Field, MD as Consulting Physician (Urology) Antonieta Iba, MD as Consulting Physician (Cardiology) Riki Altes, MD (Urology) Creig Hines, MD as Consulting Physician (Hematology and Oncology)   Name of the patient: [name]  [id number]  [birth year]   Date of visit: 06/29/23  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  At that time he was found to have a mild splenomegaly of 15.8 cm.DIPPS score is 11 (age 46- 1, hemoglobin less than 10- 2) and score of 4 if 1% circulating blasts included from 07/17/2014.   Bone marrow on 06/11/2014 was most consistent with primary myelofibrosis.  Bone marrow biopsy showed 1% abnormal cells: CD45+, CD5+, CD10, CD11c+/-, CD19+, CD2-+, (dim), CD22+ (dim, CD23+, CD38-/+, FMC7-, HLA-DR+, sig lambda+(dim).  Blasts were not increased 1.2%; hypercellular for age: 67%; JAK2 V617F mutation was negative.  CALR mutation positive.  Flow cytometry included about 1% CLL/SLL phenotype cells (CD5+) of uncertain significance and some infiltrate into the marrow with increased atypical megakaryocytes.  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  MDS FISH panel was negative.   Patient also follows up with [facility] benign hematology Dr. Ester Rink for his anemia.  Patient's hemoglobin was drifting down to the eights and was started on EPO  in October 2022.    Interval history- Patient is 83 y.o. male with above history of primary myelofibrosis, predominant anemia, who returns to clinic for follow up and consideration of retacrit. He has been admitted to hospital then rehab over past couple of months. Now home and recovering well. Feels better. Strength has improving. Unaccompanied today. Continues to work as Proofreader. Feels weak and suspects that his counts have dropped.   ECOG PS- 2 Pain scale- 0  Review of systems- Review of Systems  Constitutional:  Positive for malaise/fatigue. Negative for chills, fever and weight loss.  HENT:  Negative for congestion, ear discharge and nosebleeds.   Eyes:  Negative for blurred vision.  Respiratory:  Negative for cough, hemoptysis, sputum production, shortness of breath and wheezing.   Cardiovascular:  Negative for chest pain, palpitations, orthopnea and claudication.  Gastrointestinal:  Negative for abdominal pain, blood in stool, constipation, diarrhea, heartburn, melena, nausea and vomiting.  Genitourinary:  Negative for dysuria, flank pain, frequency, hematuria and urgency.  Musculoskeletal:  Negative for back pain, joint pain and myalgias.  Skin:  Negative for rash.  Neurological:  Negative for dizziness, tingling, focal weakness, seizures, weakness and headaches.  Endo/Heme/Allergies:  Does not bruise/bleed easily.  Psychiatric/Behavioral:  Negative for depression and suicidal ideas. The patient does not have insomnia.     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   right forearm, [facility]   Stroke (HCC) 11/2007   R brain subcortical infarct   Past Surgical History:  Procedure Laterality Date   ASPIRATION / INJECTION RENAL CYST  07/08/2015   BACK SURGERY     approx 20- 25 years ago   COLONOSCOPY     EYE SURGERY     cataract both eyes   GAS INSERTION  08/11/2011   Procedure: INSERTION OF GAS;  Surgeon: Sherrie George, MD;  Location: [facility] OR;  Service: Ophthalmology;  Laterality: Right;  [id number]   LEFT HEART CATH AND CORONARY ANGIOGRAPHY N/A 05/02/2023   Procedure: LEFT HEART CATH AND CORONARY ANGIOGRAPHY;  Surgeon: Yvonne Kendall, MD;  Location: [facility] INVASIVE CV LAB;  Service: Cardiovascular;  Laterality: N/A;   REVERSE SHOULDER ARTHROPLASTY Right 04/13/2023   Procedure: REVERSE SHOULDER ARTHROPLASTY;  Surgeon: Christena Flake, MD;  Location: [facility] ORS;  Service: Orthopedics;  Laterality: Right;   SCLERAL BUCKLE  08/11/2011   Procedure: SCLERAL BUCKLE;  Surgeon: Sherrie George, MD;  Location: [facility] OR;  Service: Ophthalmology;  Laterality: Right;   VARICOSE VEIN SURGERY     Social History   Socioeconomic History   Marital status: Married    Spouse name: [name]    Number of children: 2   Years of education: 12+   Highest education level: Some college, no degree  Occupational History   Occupation: Fish farm manager: OTHER    Comment: community   Occupation: SELF EMPLOYED    Employer: SELF EMPLOYED  Tobacco Use   Smoking status: Former    Current packs/day: 0.00    Types: Cigarettes    Quit date: 06/06/2010    Years since quitting: 13.0   Smokeless tobacco: Never  Vaping Use   Vaping status: Never Used  Substance and Sexual Activity   Alcohol use: No    Alcohol/week: 0.0 standard drinks of alcohol   Drug use: No   Sexual activity: Not Currently  Other Topics Concern   Not on  file  Social History Narrative   Pt lives at home with his family.   Caffeine Use- 2 cups daily   Patient has 2 children.    Patient has some college.    Patient is right handed.          Works full time   Social Drivers of Corporate investment banker Strain: Low Risk  (07/12/2021)   Overall Financial Resource Strain (CARDIA)    Difficulty of Paying Living Expenses: Not hard at all  Food Insecurity: No Food Insecurity (06/12/2023)   Hunger Vital Sign    Worried About Running Out of Food in the Last Year: Never true    Ran Out of Food in the Last Year: Never true  Transportation Needs: No Transportation Needs (06/12/2023)   PRAPARE - Administrator, Civil Service (Medical): No    Lack of Transportation (Non-Medical): No  Physical Activity: Inactive (07/12/2021)   Exercise Vital Sign    Days of Exercise per Week: 0 days    Minutes of Exercise per Session: 0 min  Stress: No Stress Concern Present (07/12/2021)   [employer] of Occupational  Health - Occupational Stress Questionnaire    Feeling of Stress : Not at all  Social Connections: Socially Integrated (06/12/2023)   Social Connection and Isolation Panel [NHANES]    Frequency of Communication with Friends and Family: More than three times a week    Frequency of Social Gatherings with Friends and Family: More than three times a week    Attends Religious Services: More than 4 times per year    Active Member of [employer] or Organizations: Yes    Attends Engineer, structural: More than 4 times per year    Marital Status: Married  Catering manager Violence: Not At Risk (06/12/2023)   Humiliation, Afraid, Rape, and Kick questionnaire    Fear of Current or Ex-Partner: No    Emotionally Abused: No    Physically Abused: No    Sexually Abused: No   Family History  Problem Relation Age of Onset   Heart disease Father    Diabetes Son    Kidney disease Neg Hx    Prostate cancer Neg Hx    Current Outpatient Medications:     acetaminophen (TYLENOL) 500 MG tablet, Take 1,000 mg by mouth every 6 (six) hours as needed for mild pain (pain score 1-3)., Disp: , Rfl:    amiodarone (PACERONE) 200 MG tablet, Take 200 mg by mouth daily., Disp: , Rfl:    apixaban (ELIQUIS) 2.5 MG TABS tablet, Take 1 tablet (2.5 mg total) by mouth 2 (two) times daily., Disp: , Rfl:    azelastine (ASTELIN) 0.1 % nasal spray, Place 1 spray into both nostrils 2 (two) times daily. Use in each nostril as directed (Patient taking differently: Place 1 spray into both nostrils 2 (two) times daily as needed for rhinitis or allergies. Use in each nostril as directed), Disp: 30 mL, Rfl: 0   Calcium Citrate-Vitamin D (CITRACAL + D PO), Take 1 tablet by mouth in the morning. (Patient not taking: Reported on 06/12/2023), Disp: , Rfl:    Cholecalciferol (D3-1000 PO), Take 1,000 Units by mouth in the morning., Disp: , Rfl:    DULoxetine (CYMBALTA) 20 MG capsule, Take 1 capsule (20 mg total) by mouth at bedtime. TAKE 1 CAPSULE BY MOUTH EVERY DAY (Patient not taking: Reported on 06/12/2023), Disp: , Rfl:    FARXIGA 10 MG TABS tablet, Take 10 mg by mouth daily. (Patient not taking: Reported on 06/12/2023), Disp: , Rfl:    furosemide (LASIX) 40 MG tablet, Take 40 mg by mouth daily., Disp: , Rfl:    gabapentin (NEURONTIN) 300 MG capsule, Take 1 capsule (300 mg total) by mouth 2 (two) times daily., Disp: , Rfl:    ketoconazole (NIZORAL) 2 % cream, Apply twice daily to affected area on buttocks until clear (Patient not taking: Reported on 06/12/2023), Disp: 60 g, Rfl: 2   levothyroxine (SYNTHROID) 50 MCG tablet, Take 1 tablet (50 mcg total) by mouth daily before breakfast., Disp: 90 tablet, Rfl: 0   lidocaine (LIDODERM) 5 %, Place 1 patch onto the skin daily. Remove & Discard patch within 12 hours or as directed by MD (Patient taking differently: Place 1 patch onto the skin daily as needed. Remove & Discard patch within 12 hours or as directed by MD), Disp: 60 patch, Rfl: 5    Lidocaine-Menthol (NERVIVE ROLL-ON EX), Apply 1 application  topically every 4 (four) hours as needed (mild pain)., Disp: , Rfl:    meclizine (ANTIVERT) 25 MG tablet, TAKE 1 TABLET BY MOUTH THREE TIMES DAILYAS NEEDED FOR DIZZINESS (  Patient not taking: Reported on 06/12/2023), Disp: 90 tablet, Rfl: 6   melatonin 3 MG TABS tablet, Take 3 mg by mouth at bedtime as needed (insomnia)., Disp: , Rfl:    metoprolol succinate (TOPROL-XL) 100 MG 24 hr tablet, Take 1 tablet (100 mg total) by mouth 2 (two) times daily. Take with or immediately following a meal., Disp: , Rfl:    Multiple Vitamin (MULITIVITAMIN WITH MINERALS) TABS, Take 1 tablet by mouth in the morning., Disp: , Rfl:    pantoprazole (PROTONIX) 40 MG tablet, Take by mouth., Disp: , Rfl:    predniSONE (DELTASONE) 10 MG tablet, Take 10 mg by mouth daily with breakfast. Take 3 tablets (30 mg total) by mouth daily for 7 days, THEN 2 tablets (20 mg total) daily for 7 days, THEN 1 and 1/2 tablets (15 mg total) daily for 7 days, THEN 1 tablet (10 mg total) daily for 7 days, THEN 1/2 tablet (5 mg total) daily for 7 days., Disp: , Rfl:    simvastatin (ZOCOR) 40 MG tablet, TAKE ONE TABLET BY MOUTH EVERY DAY, Disp: 90 tablet, Rfl: 3   sodium bicarbonate 650 MG tablet, Take 1 tablet (650 mg total) by mouth 2 (two) times daily. (Patient not taking: Reported on 06/27/2023), Disp: , Rfl:    tamsulosin (FLOMAX) 0.4 MG CAPS capsule, Take 2 capsules (0.8 mg total) by mouth every evening., Disp: , Rfl:    zinc gluconate 50 MG tablet, Take 50 mg by mouth in the morning. (Patient not taking: Reported on 06/12/2023), Disp: , Rfl:  No current facility-administered medications for this visit.  Facility-Administered Medications Ordered in Other Visits:    epoetin alfa-epbx (RETACRIT) injection 10,000 Units, 10,000 Units, Subcutaneous, Once, Creig Hines, MD   epoetin alfa-epbx (RETACRIT) injection 40,000 Units, 40,000 Units, Subcutaneous, Once, Creig Hines, MD  Physical  exam:  Vitals:   06/29/23 0948  BP: (!) 140/70  Pulse: 67  Temp: (!) 96.2 F (35.7 C)  TempSrc: Tympanic  SpO2: 100%  Weight: 168 lb (76.2 kg)   Physical Exam Vitals reviewed.  Constitutional:      Appearance: He is not ill-appearing.  Cardiovascular:     Rate and Rhythm: Normal rate and regular rhythm.     Heart sounds: Normal heart sounds.  Pulmonary:     Effort: Pulmonary effort is normal. No respiratory distress.     Comments: Diminished bilaterally Abdominal:     General: There is no distension.  Musculoskeletal:     Comments: walker  Skin:    General: Skin is warm and dry.  Neurological:     Mental Status: He is alert and oriented to person, place, and time.  Psychiatric:        Mood and Affect: Mood normal.        Behavior: Behavior normal.        Latest Ref Rng & Units 06/12/2023    4:02 PM  CMP  Glucose 70 - 99 mg/dL 469   BUN 8 - 27 mg/dL 63   Creatinine 6.29 - 1.27 mg/dL 5.28   Sodium 413 - 244 mmol/L 145   Potassium 3.5 - 5.2 mmol/L 5.4   Chloride 96 - 106 mmol/L 107   CO2 20 - 29 mmol/L 21   Calcium 8.6 - 10.2 mg/dL 7.4       Latest Ref Rng & Units 06/29/2023    9:32 AM  CBC  WBC 4.0 - 10.5 K/uL 6.0   Hemoglobin 13.0 - 17.0 g/dL 6.2  Hematocrit 39.0 - 52.0 % 19.9   Platelets 150 - 400 K/uL 253    Iron/TIBC/Ferritin/ %Sat    Component Value Date/Time   IRON 51 01/25/2023 0943   IRON 61 (L) 05/14/2014 1417   TIBC 217 (L) 01/25/2023 0943   TIBC 246 (L) 05/14/2014 1417   FERRITIN 241 01/25/2023 0943   FERRITIN 514 (H) 05/28/2014 0945   IRONPCTSAT 24 01/25/2023 0943   IRONPCTSAT 25 05/14/2014 1417     Assessment and plan- Patient is a 83 y.o. male who returns to clinic for follow up:   Primary myelofibrosis - predominantly anemia - currently on retacrit 50,000 units weekly - last received retacrit 04/26/23 outpatient (see below) but has received intermittently inpatient as well as transfusions. - Hemoglobin worse- 6.2 today. Updated Dr  Smith Robert - iron stores well repleted per December labs - plan for transfusion of 1 unit pRBCs tomorrow - retacrit today - Plan to resume weekly labs +/- retacrit 2. Splenomegaly  - 19 cm on MRI from 02/02/23 (performed for f/u of liver lesion- benign) - Plan to repeat ultrasound to follow up in March 2025 - If continued increase in spleen size, consider switching to jakafi at that time 3. Liver lesion  - incidental on renal ultrasound July 2024  - Appears to be benign hemangioma measuring 2.7 x 2.3 cm.   - no follow up required.  4. Stage IV CKD  - managed by cardiology  - recommended he follow up with nephrology  5. Pericardititis in setting of acute on chronic systolic CHF  - multiple interval hospitalizations at [facility] and OSH  - now s/p rehab admission.   - Clinically improving 6. A fib w/ RVR  - during hospitalization  - on eliquis & metoprolol  - regular rhythm today  Disposition - retacrit today - transfusion 1 unit pRBCs tomorrow - 1 week- lab (H&H, hold tube), +/- retacrit - D2 possible transfusion - 2 weeks for lab (cbc, hold tube), see Dr Smith Robert, possible retacrit  - D2 possible transfusion- la   Visit Diagnosis 1. Symptomatic anemia   2. Erythropoietin (EPO) stimulating agent anemia management patient   3. Myelofibrosis (HCC)   4. CKD (chronic kidney disease) stage 4, GFR 15-29 ml/min (HCC)    Consuello Masse, DNP, AGNP-C, AOCNP [facility] [phone] (clinic) 06/29/2023

## 2023-06-29 NOTE — Progress Notes (Signed)
Error

## 2023-06-30 ENCOUNTER — Encounter: Payer: Self-pay | Admitting: Family Medicine

## 2023-06-30 ENCOUNTER — Ambulatory Visit (INDEPENDENT_AMBULATORY_CARE_PROVIDER_SITE_OTHER): Payer: Medicare Other | Admitting: Family Medicine

## 2023-06-30 ENCOUNTER — Inpatient Hospital Stay: Payer: Medicare Other

## 2023-06-30 VITALS — BP 107/66 | HR 71 | Wt 172.2 lb

## 2023-06-30 DIAGNOSIS — N184 Chronic kidney disease, stage 4 (severe): Secondary | ICD-10-CM | POA: Diagnosis not present

## 2023-06-30 DIAGNOSIS — R3911 Hesitancy of micturition: Secondary | ICD-10-CM | POA: Diagnosis not present

## 2023-06-30 DIAGNOSIS — N410 Acute prostatitis: Secondary | ICD-10-CM | POA: Diagnosis not present

## 2023-06-30 DIAGNOSIS — D649 Anemia, unspecified: Secondary | ICD-10-CM

## 2023-06-30 DIAGNOSIS — D631 Anemia in chronic kidney disease: Secondary | ICD-10-CM | POA: Diagnosis not present

## 2023-06-30 DIAGNOSIS — I129 Hypertensive chronic kidney disease with stage 1 through stage 4 chronic kidney disease, or unspecified chronic kidney disease: Secondary | ICD-10-CM | POA: Diagnosis not present

## 2023-06-30 DIAGNOSIS — M25531 Pain in right wrist: Secondary | ICD-10-CM

## 2023-06-30 DIAGNOSIS — D7581 Myelofibrosis: Secondary | ICD-10-CM

## 2023-06-30 DIAGNOSIS — Z79899 Other long term (current) drug therapy: Secondary | ICD-10-CM

## 2023-06-30 LAB — MICROSCOPIC EXAMINATION: WBC, UA: >30 /[HPF] — AB (ref 0–5)

## 2023-06-30 LAB — URINALYSIS, ROUTINE W REFLEX MICROSCOPIC
Bilirubin, UA: NEGATIVE
Glucose, UA: NEGATIVE
Ketones, UA: NEGATIVE
Nitrite, UA: NEGATIVE
Specific Gravity, UA: 1.015 (ref 1.005–1.030)
Urobilinogen, Ur: 0.2 mg/dL (ref 0.2–1.0)
pH, UA: 5.5 (ref 5.0–7.5)

## 2023-06-30 MED ORDER — FUROSEMIDE 40 MG PO TABS: 40.0000 mg | ORAL_TABLET | Freq: Every day | ORAL | 1 refills | Status: DC

## 2023-06-30 MED ORDER — TAMSULOSIN HCL 0.4 MG PO CAPS: 0.8000 mg | ORAL_CAPSULE | Freq: Every evening | ORAL | 1 refills | Status: DC

## 2023-06-30 MED ORDER — PANTOPRAZOLE SODIUM 40 MG PO TBEC: 40.0000 mg | DELAYED_RELEASE_TABLET | Freq: Every day | ORAL | 1 refills | Status: DC

## 2023-06-30 MED ORDER — DULOXETINE HCL 20 MG PO CPEP: 20.0000 mg | ORAL_CAPSULE | Freq: Every day | ORAL | 1 refills | Status: DC

## 2023-06-30 MED ORDER — APIXABAN 2.5 MG PO TABS: 2.5000 mg | ORAL_TABLET | Freq: Two times a day (BID) | ORAL | 3 refills | Status: DC

## 2023-06-30 MED ORDER — SODIUM CHLORIDE 0.9% IV SOLUTION
250.0000 mL | INTRAVENOUS | Status: DC
  Administered 2023-06-30: 250 mL via INTRAVENOUS
  Filled 2023-06-30: qty 250

## 2023-06-30 MED ORDER — CIPROFLOXACIN HCL 500 MG PO TABS: 500.0000 mg | ORAL_TABLET | Freq: Two times a day (BID) | ORAL | 0 refills | Status: DC

## 2023-06-30 MED ORDER — GABAPENTIN 300 MG PO CAPS: 300.0000 mg | ORAL_CAPSULE | Freq: Two times a day (BID) | ORAL | 1 refills | Status: DC

## 2023-06-30 MED ORDER — TRAMADOL HCL 50 MG PO TABS: 50.0000 mg | ORAL_TABLET | Freq: Three times a day (TID) | ORAL | 0 refills | Status: AC | PRN

## 2023-06-30 NOTE — Patient Instructions (Addendum)
Patient has an appointment scheduled for Monday at 10:45 AM with [name]  Address: [address] [place], [place], [place] [address]

## 2023-06-30 NOTE — Progress Notes (Unsigned)
BP 107/66   Pulse 71   Wt 172 lb 3.2 oz (78.1 kg)   SpO2 94%   BMI 24.71 kg/m    Subjective:    Patient ID: [name], male    DOB: 08/12/1940, 83 y.o.   MRN: [id number]  HPI: [name] is a 83 y.o. male  Chief Complaint  Patient presents with   Wrist Pain    Patient wife said the patient's wrist isn't getting better. She says she noticed some swelling and says she has been trying [place] Dream Arthritis cream.    WRIST PAIN  Duration: weeks Involved wrist: right Mechanism of injury:   Fall Location: diffuse Onset: sudden Severity: severe  Quality:  aching, sore, sharp Frequency: constant Radiation: no Aggravating factors: movement  Alleviating factors: muscle rub, not moving  Status: stable Treatments attempted:  muscle rub, rest, ice, heat, and APAP    Relief with NSAIDs?:  No NSAIDs Taken Weakness: no Numbness: no  Redness: no Bruising: yes Swelling: yes Fevers: no  URINARY SYMPTOMS Duration: couple of days Dysuria: burning Urinary frequency: yes Urgency: yes Small volume voids: yes Symptom severity: severe Urinary incontinence: yes Foul odor: yes Hematuria: no Abdominal pain: no Back pain: yes Suprapubic pain/pressure: yes Flank pain: no Fever:  no Vomiting: no Relief with cranberry juice: no Relief with pyridium: no Status: better Previous urinary tract infection: yes Recurrent urinary tract infection: no History of sexually transmitted disease: no Penile discharge: no Treatments attempted: increasing fluids    Relevant past medical, surgical, family and social history reviewed and updated as indicated. Interim medical history since our last visit reviewed. Allergies and medications reviewed and updated.  Review of Systems  Constitutional: Negative.   Respiratory: Negative.    Cardiovascular: Negative.   Gastrointestinal: Negative.   Genitourinary:  Positive for decreased urine volume, frequency and urgency. Negative for  difficulty urinating, dysuria, enuresis, flank pain, genital sores, hematuria, penile discharge, penile pain, penile swelling, scrotal swelling and testicular pain.  Musculoskeletal: Negative.   Neurological: Negative.   Psychiatric/Behavioral: Negative.      Per HPI unless specifically indicated above     Objective:    BP 107/66   Pulse 71   Wt 172 lb 3.2 oz (78.1 kg)   SpO2 94%   BMI 24.71 kg/m   Wt Readings from Last 3 Encounters:  06/30/23 172 lb 3.2 oz (78.1 kg)  06/29/23 168 lb (76.2 kg)  06/12/23 168 lb 9.6 oz (76.5 kg)    Physical Exam Vitals and nursing note reviewed.  Constitutional:      General: He is not in acute distress.    Appearance: Normal appearance. He is not ill-appearing, toxic-appearing or diaphoretic.  HENT:     Head: Normocephalic and atraumatic.     Right Ear: External ear normal.     Left Ear: External ear normal.     Nose: Nose normal.     Mouth/Throat:     Mouth: Mucous membranes are moist.     Pharynx: Oropharynx is clear.  Eyes:     General: No scleral icterus.       Right eye: No discharge.        Left eye: No discharge.     Extraocular Movements: Extraocular movements intact.     Conjunctiva/sclera: Conjunctivae normal.     Pupils: Pupils are equal, round, and reactive to light.  Cardiovascular:     Rate and Rhythm: Normal rate and regular rhythm.     Pulses: Normal  pulses.     Heart sounds: Normal heart sounds. No murmur heard.    No friction rub. No gallop.  Pulmonary:     Effort: Pulmonary effort is normal. No respiratory distress.     Breath sounds: Normal breath sounds. No stridor. No wheezing, rhonchi or rales.  Chest:     Chest wall: No tenderness.  Musculoskeletal:        General: Normal range of motion.     Cervical back: Normal range of motion and neck supple.  Skin:    General: Skin is warm and dry.     Capillary Refill: Capillary refill takes less than 2 seconds.     Coloration: Skin is pale. Skin is not jaundiced.      Findings: No bruising, erythema, lesion or rash.  Neurological:     General: No focal deficit present.     Mental Status: He is alert and oriented to person, place, and time. Mental status is at baseline.  Psychiatric:        Mood and Affect: Mood normal.        Behavior: Behavior normal.        Thought Content: Thought content normal.        Judgment: Judgment normal.     Results for orders placed or performed in visit on 06/30/23  Microscopic Examination   Collection Time: 06/30/23  2:23 PM   Urine  Result Value Ref Range   WBC, UA >30 (A) 0 - 5 /hpf   RBC, Urine 0-2 0 - 2 /hpf   Epithelial Cells (non renal) 0-10 0 - 10 /hpf   Bacteria, UA Many (A) None seen/Few  Urinalysis, Routine w reflex microscopic   Collection Time: 06/30/23  2:23 PM  Result Value Ref Range   Specific Gravity, UA 1.015 1.005 - 1.030   pH, UA 5.5 5.0 - 7.5   Color, UA Straw Yellow   Appearance Ur Cloudy (A) Clear   Leukocytes,UA 2+ (A) Negative   Protein,UA 3+ (A) Negative/Trace   Glucose, UA Negative Negative   Ketones, UA Negative Negative   RBC, UA Trace (A) Negative   Bilirubin, UA Negative Negative   Urobilinogen, Ur 0.2 0.2 - 1.0 mg/dL   Nitrite, UA Negative Negative   Microscopic Examination See below:       Assessment & Plan:   Problem List Items Addressed This Visit   None Visit Diagnoses       Right wrist pain    -  Primary   X-ray showed arthritis, likely bad sprain. Will get him into ortho and give him some tramadol for now. Call with any concerns.   Relevant Orders   Ambulatory referral to Orthopedic Surgery     Acute prostatitis       Will treat with cipro. Continue to monitor. Call with any concerns.     Hesitancy       Will treat for prostatitis. Call with any concerns.   Relevant Orders   Urinalysis, Routine w reflex microscopic (Completed)        Follow up plan: Return in about 1 week (around 07/07/2023).

## 2023-07-01 ENCOUNTER — Other Ambulatory Visit: Payer: Self-pay | Admitting: Dermatology

## 2023-07-01 DIAGNOSIS — I3139 Other pericardial effusion (noninflammatory): Secondary | ICD-10-CM | POA: Diagnosis not present

## 2023-07-01 DIAGNOSIS — B356 Tinea cruris: Secondary | ICD-10-CM

## 2023-07-01 DIAGNOSIS — I5020 Unspecified systolic (congestive) heart failure: Secondary | ICD-10-CM | POA: Diagnosis not present

## 2023-07-01 DIAGNOSIS — I4891 Unspecified atrial fibrillation: Secondary | ICD-10-CM | POA: Diagnosis not present

## 2023-07-01 DIAGNOSIS — N184 Chronic kidney disease, stage 4 (severe): Secondary | ICD-10-CM | POA: Diagnosis not present

## 2023-07-01 DIAGNOSIS — I130 Hypertensive heart and chronic kidney disease with heart failure and stage 1 through stage 4 chronic kidney disease, or unspecified chronic kidney disease: Secondary | ICD-10-CM | POA: Diagnosis not present

## 2023-07-01 DIAGNOSIS — D631 Anemia in chronic kidney disease: Secondary | ICD-10-CM | POA: Diagnosis not present

## 2023-07-01 LAB — BPAM RBC
Blood Product Expiration Date: 202501262359
ISSUE DATE / TIME: 202501240931
Unit Type and Rh: 5100

## 2023-07-01 LAB — TYPE AND SCREEN
ABO/RH(D): O POS
Antibody Screen: NEGATIVE
Unit division: 0

## 2023-07-02 ENCOUNTER — Encounter: Payer: Self-pay | Admitting: Family Medicine

## 2023-07-03 DIAGNOSIS — N184 Chronic kidney disease, stage 4 (severe): Secondary | ICD-10-CM | POA: Diagnosis not present

## 2023-07-03 DIAGNOSIS — I5020 Unspecified systolic (congestive) heart failure: Secondary | ICD-10-CM | POA: Diagnosis not present

## 2023-07-03 DIAGNOSIS — D631 Anemia in chronic kidney disease: Secondary | ICD-10-CM | POA: Diagnosis not present

## 2023-07-03 DIAGNOSIS — I3139 Other pericardial effusion (noninflammatory): Secondary | ICD-10-CM | POA: Diagnosis not present

## 2023-07-03 DIAGNOSIS — I4891 Unspecified atrial fibrillation: Secondary | ICD-10-CM | POA: Diagnosis not present

## 2023-07-03 DIAGNOSIS — I130 Hypertensive heart and chronic kidney disease with heart failure and stage 1 through stage 4 chronic kidney disease, or unspecified chronic kidney disease: Secondary | ICD-10-CM | POA: Diagnosis not present

## 2023-07-03 NOTE — Progress Notes (Deleted)
   Cardiology Office Note    Date:  07/03/2023  ID:  TRENTAN TRIPPE, DOB 1940/09/18, MRN [id number] PCP:  [name], DO  Cardiologist:  Julien Nordmann, MD  Electrophysiologist:  None   Chief Complaint: ***  History of Present Illness: .    [name] is a 83 y.o. male with visit-pertinent history of hypertension, hyperlipidemia, PAD, stroke, myelofibrosis with chronic anemia and chronic kidney disease stage III-IV.   In 04/2023 he presented to [facility] with weakness, abdominal and chest pain relating to the right shoulder.  EMS was called, on arrival he was tachycardic with extensive ST segment elevation prompting activation of the STEMI team.  Cardiac catheterization showed mild, nonobstructive CAD.  Bedside echo showed mildly reduced LVEF with global hypokinesis confounded by atrial fibrillation with rapid ventricular response, a small pericardial effusion was also noted.  It was felt that his EKG changes and chest pain was likely related to acute pericarditis.  He was also found to be in atrial fibrillation with RVR.  ESR was downtrending during hospitalization from 127>>109>>74.  Given his AKI/CKD medication management was limited.  He was given 1 dose of colchicine and required avoidance of NSAIDs.  He was started on prednisone with plan to taper once ESR had normalized.  Follow-up echo during admission indicated EF 45 to 50% on 12/1.  Diuresis was deferred to nephrology.  He was started on Eliquis 2.5 mg twice daily.  He was unfortunately lost to follow-up.  Preoperative cardiac evaluation: Renal biopsy with Dr. Mosetta Pigeon # [phone] Pericarditis:  Atrial fibrillation:  HTN:  CKD stage III/IV:     Labwork independently reviewed:   ROS: .    Please see the history of present illness. Otherwise, review of systems is positive for ***.  All other systems are reviewed and otherwise negative.  Studies Reviewed: Marland Kitchen    EKG:  EKG is ordered today, personally reviewed,  demonstrating ***  CV Studies: Cardiac studies reviewed are outlined and summarized above. Otherwise please see EMR for full report.   Current Reported Medications:.    No outpatient medications have been marked as taking for the 07/04/23 encounter (Appointment) with Rip Harbour, NP.    Physical Exam:    VS:  There were no vitals taken for this visit.   Wt Readings from Last 3 Encounters:  06/30/23 172 lb 3.2 oz (78.1 kg)  06/29/23 168 lb (76.2 kg)  06/12/23 168 lb 9.6 oz (76.5 kg)    GEN: Well nourished, well developed in no acute distress NECK: No JVD; No carotid bruits CARDIAC: ***RRR, no murmurs, rubs, gallops RESPIRATORY:  Clear to auscultation without rales, wheezing or rhonchi  ABDOMEN: Soft, non-tender, non-distended EXTREMITIES:  No edema; No acute deformity   Asessement and Plan:.     ***     Disposition: F/u with ***  Signed, Rip Harbour, NP

## 2023-07-04 ENCOUNTER — Telehealth: Payer: Self-pay

## 2023-07-04 ENCOUNTER — Ambulatory Visit: Payer: Medicare Other | Attending: Medical | Admitting: Medical

## 2023-07-04 ENCOUNTER — Telehealth: Payer: Self-pay | Admitting: Family Medicine

## 2023-07-04 ENCOUNTER — Encounter: Payer: Self-pay | Admitting: Oncology

## 2023-07-04 ENCOUNTER — Ambulatory Visit: Payer: Medicare Other | Admitting: Cardiology

## 2023-07-04 ENCOUNTER — Encounter: Payer: Self-pay | Admitting: Medical

## 2023-07-04 VITALS — BP 100/58 | HR 78 | Ht 70.0 in | Wt 172.2 lb

## 2023-07-04 DIAGNOSIS — I213 ST elevation (STEMI) myocardial infarction of unspecified site: Secondary | ICD-10-CM

## 2023-07-04 DIAGNOSIS — I5022 Chronic systolic (congestive) heart failure: Secondary | ICD-10-CM | POA: Insufficient documentation

## 2023-07-04 DIAGNOSIS — Z01810 Encounter for preprocedural cardiovascular examination: Secondary | ICD-10-CM | POA: Diagnosis not present

## 2023-07-04 DIAGNOSIS — J90 Pleural effusion, not elsewhere classified: Secondary | ICD-10-CM | POA: Diagnosis not present

## 2023-07-04 DIAGNOSIS — I639 Cerebral infarction, unspecified: Secondary | ICD-10-CM | POA: Diagnosis not present

## 2023-07-04 DIAGNOSIS — I4891 Unspecified atrial fibrillation: Secondary | ICD-10-CM | POA: Diagnosis not present

## 2023-07-04 DIAGNOSIS — N184 Chronic kidney disease, stage 4 (severe): Secondary | ICD-10-CM | POA: Diagnosis not present

## 2023-07-04 DIAGNOSIS — Z79899 Other long term (current) drug therapy: Secondary | ICD-10-CM | POA: Diagnosis not present

## 2023-07-04 DIAGNOSIS — D649 Anemia, unspecified: Secondary | ICD-10-CM | POA: Diagnosis not present

## 2023-07-04 DIAGNOSIS — I3139 Other pericardial effusion (noninflammatory): Secondary | ICD-10-CM | POA: Diagnosis not present

## 2023-07-04 DIAGNOSIS — G459 Transient cerebral ischemic attack, unspecified: Secondary | ICD-10-CM | POA: Diagnosis not present

## 2023-07-04 NOTE — Telephone Encounter (Signed)
Patient being seen today in the office for surgical clearance. Notes will be forwarded to provider and removed from preop pool. Tereso Newcomer, PA-C    07/04/2023 9:59 AM

## 2023-07-04 NOTE — Telephone Encounter (Signed)
Called patient to confirm appointment today, Looks like he is usually seen in [place] was going to offer him appointment in [place] to have him seen there in [place] if he preferred, Looks like there was a 10:05 and 2:45 with Talbert Forest PA-C. Per Reather Littler NP

## 2023-07-04 NOTE — Progress Notes (Unsigned)
Cardiology Office Note:  .   Date:  07/06/2023  ID:  [name], DOB Apr 04, 1941, MRN [id number] PCP: Christian Carrow, DO  [facility] Providers Cardiologist:  Julien Nordmann, MD {  History of Present Illness: .   [name] is a 83 y.o. male with a history of myelofibrosis and anemia followed by heme-onc, CKD stage 3-4, HTN, HLD, PAD, and CVA who is being seen for hospital follow-up.  The patient was admitted 11/26-12/3/24 at [facility] for acute pericarditis/myocarditis, HFmrEF, Afib RVR, AKI and acute on chronic anemia. Cath showed relatively normal coronary arteries. Echo showed EF 40-45% with global HK. Limimted echo 12/1 showed EF 45-50%, small pericardial effusion. He was started on prednisone given AKI/CKD. ESR 127>>>74. Also had new dx of Afib with rates in the low 100s. He was started on Eliquis 2.5mg  BID. The patient self-diuresed.   He was admitted at [facility] 12/17-12/27/24 for pericardial effusion, pleural effusion, acute HF, slurred speech,AKI on CKD stage 4/nephrotic range proteinuria, typical aflutter and pericarditis.  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 Pt improved with high dose steroids and diuresis. He additionally had thoracentesis for exudative pleural effusion. He had attempt at cardioversion 12/19 with return to NSR but back into aflutter s/p DCCV for atrial flutter 12/26 with conversion to NSR with 1st degree AV block, started on amiodarone. MRI head 12/17 non-acute. He was discharged on lasix 40mg  daily. He was discharged to a rehab facility.  Today, the patient is needing a kidney biopsy and knee replacements. He is in NSR . He is back at home. He uses a walker. He can walk 1 block, function is limited. No chest pain or shortness of breath. A little lightheadedness and dizziness when he walks. He has mild lower leg edema.   Studies Reviewed: Marland Kitchen    EKG Interpretation Date/Time:  Tuesday July 04 2023 14:31:54 EST Ventricular Rate:  78 PR Interval:  208 QRS Duration:  82 QT Interval:  390 QTC Calculation: 444 R Axis:   27  Text Interpretation: Normal sinus rhythm Septal infarct , age undetermined ST & T wave abnormality, consider lateral ischemia When compared with ECG of 04-May-2023 08:08, PREVIOUS ECG IS PRESENT Confirmed by Terrilee Croak ([id number]) on 07/04/2023 2:37:04 PM   Limited echo 06/02/23  Summary   1. The left ventricular systolic function is normal, LVEF is visually  estimated at 60-65%.    2. The aortic valve is trileaflet with mildly thickened leaflets with normal  excursion.   3. The right ventricle is normal in size, with normal systolic function.    4. IVC size and inspiratory change suggest elevated right atrial pressure.  (10-20 mmHg).    5. Pericardial thickening present, but no evidence of overt constrictive  physiology is noted on this study.    6. There is a right pleural effusion present.    Left Ventricle    The left ventricle is normal in size with upper normal wall thickness. The  left ventricular systolic function is normal, LVEF is visually estimated at  60-65%. The LV global longitudinal strain tracking is suboptimal to accurately  report the GLS measurement.   Right Ventricle    The right ventricle is normal in size, with normal systolic function.  Left Atrium    The left atrium is normal in size.  Right Atrium    The right atrium is mildly dilated in  size. A chiari network is noted.  Aortic Valve    The aortic valve is trileaflet with mildly thickened leaflets with normal  excursion.   Mitral Valve    The mitral valve leaflets are mildly thickened with normal leaflet mobility.  Tricuspid Valve    The tricuspid valve leaflets are normal, with normal leaflet mobility. There  is trivial tricuspid regurgitation.  Inferior Vena Cava    IVC size and inspiratory change suggest elevated  right atrial pressure.  (10-20 mmHg).  Pericardium/Pleural   There is a trivial pericardial effusion. Pericardial thickening present, but  no evidence of overt constrictive physiology is noted on this study. There is  a right pleural effusion present.   Cardiac Catheterization  11.26.2024   Diagnostic Dominance: Right  _____________    2D Echocardiogram 12.1.2024   1. Left ventricular ejection fraction, by estimation, is 45 to 50%. The  left ventricle has mildly decreased function. The left ventricle  demonstrates global hypokinesis. Left ventricular diastolic parameters are  indeterminate.   2. Right ventricular systolic function is normal. The right ventricular  size is normal. There is mildly elevated pulmonary artery systolic  pressure.   3. Left atrial size was severely dilated.   4. Right atrial size was moderately dilated.   5. Small pericardial effusion that is slightly larger than the echo  05/03/23. a small pericardial effusion is present. There is no evidence of  cardiac tamponade.   6. The mitral valve is normal in structure. Mild mitral valve  regurgitation. No evidence of mitral stenosis.   7. The aortic valve is tricuspid. There is mild calcification of the  aortic valve. Aortic valve regurgitation is not visualized. No aortic  stenosis is present.   8. Aortic dilatation noted. There is mild dilatation of the ascending  aorta, measuring 40 mm.   9. The inferior vena cava is dilated in size with >50% respiratory  variability, suggesting right atrial pressure of 8 mmHg.            Physical Exam:   VS:  BP (!) 100/58 (BP Location: Left Arm, Patient Position: Sitting, Cuff Size: Normal)   Pulse 78   Ht 5\' 10"  (1.778 m)   Wt 172 lb 3.2 oz (78.1 kg)   BMI 24.71 kg/m    Wt Readings from Last 3 Encounters:  07/04/23 172 lb 3.2 oz (78.1 kg)  06/30/23 172 lb 3.2 oz (78.1 kg)  06/29/23 168 lb (76.2 kg)    GEN: Well nourished, well developed in no acute  distress NECK: No JVD; No carotid bruits CARDIAC: RRR, no murmurs, rubs, gallops RESPIRATORY:  Clear to auscultation without rales, wheezing or rhonchi  ABDOMEN: Soft, non-tender, non-distended EXTREMITIES:  No edema; No deformity   ASSESSMENT AND PLAN: .    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 He is back home and he is needing kidney biopsy and eventually knee replacements- these have not been scheduled. The patient is symptomatically stable. He is s/p successful DCCV 12/27 and remains on amio and Eliquis. He is in NSR on EKG.  he received 1 unit transfusion 1/23 by heme/onc.Most recent Hgb 6.9. repeat CBC today. He denies chest pain or significant SOB reported. Appears euvolemic. Will check repeat limited echo for pericardial effusion. Overall  kidney biopsy is low risk procedure, likely OK to perform. Will discuss Eliquis with MD. METS 4.36. According to RCRI he is 10.1% risk of MACE  ADDENDUM 07/06/23: CBC showed Hgb 6.9 and it was recommended he stop Eliquis and return to heme/onc for infusion. He underwent 1 unit PRBCs 07/05/23. Repeat Hgb 7.1   Spoke to Dr. Mariah Milling who felt pursuing kidney biopsy is OK. Continue to hold Eliquis until Hgb improves to ~10. Would not pursue knee replacements at this time since they are elective.   Pericardial effusion Patient was sent home from [facility] on prednisone taper. Limited echo 06/02/23 showed LVEF 60-65%, pericardial thickening with no overt contriction, right pleural effusion, trivial pericardial effusion. No chest pain reported. I will check a CRP and sed rate as well as repeat a limited echo.   Afib/flutter AT [facility] he underwent 2 cardioversions, the second he  was loaded with amiodarone and it was successful. He is in NSR today. He is taking Eliquis 2.5mg  BID for stroke ppx. CBC today. Continue amiodarone 200mg  daily and Toprol 25mg  daily  HFmrEF Echo 05/07/23 LVEF 45-50%. Echo 12/18 LVEF 45%.EF found to be low in the setting of tachyarrythmia. Repeat Echo 12/27 showed improved LVEF 60-65%. GDMT limited by CKD and low BP. Continue lasix 40mg  daily and Toprol 25mg  daily. BMET today.   Exudative Left pleural effusion He underwent thoracentesis 12/26. Breathing is stable  Anemia Hgb baseline 9-10, but dropped to 7.4 and was given erythropoietin 06/08/23 prior to d/c from rehab. CBC today as he is on Eliquis.   AKI on CKD stage 4 Prior admission Scr 2.8-3. Repeat admission 3.5-3.9. nephrology following. Nephrotic range BMET today.  Possible TIA Reported L facial droop with right sided weakness. MR brain was non-acute.    Dispo: Follow-up in 1 month  Signed, Adhrit Krenz David Stall, PA-C

## 2023-07-04 NOTE — Telephone Encounter (Signed)
Left message to call back

## 2023-07-04 NOTE — Telephone Encounter (Signed)
Patient with diagnosis of Atrial fibrillation on Eliquis for anticoagulation.    Procedure: renal biopsy Date of procedure: TBD   CHA2DS2-VASc Score = 6   This indicates a 9.7% annual risk of stroke. The patient's score is based upon: CHF History: 1 HTN History: 0 Diabetes History: 0 Stroke History: 2 Vascular Disease History: 1 Age Score: 2 Gender Score: 0   History of stroke in 2009  CrCl 19 Platelet count 253  Per office protocol, patient can hold Eiiquis for 3 days prior to procedure.   Patient will not need bridging with Lovenox (enoxaparin) around procedure.  **This guidance is not considered finalized until pre-operative APP has relayed final recommendations.**

## 2023-07-04 NOTE — Telephone Encounter (Signed)
Copied from CRM [id number]. Topic: General - Other >> Jul 04, 2023  8:12 AM Charletta Cousin wrote: Iona Coach OT from [facility]  [phone]     Requesting OT orders for 1x 7 weeks

## 2023-07-04 NOTE — Patient Instructions (Signed)
Medication Instructions:  Your physician recommends that you continue on your current medications as directed. Please refer to the Current Medication list given to you today.   *If you need a refill on your cardiac medications before your next appointment, please call your pharmacy*   Lab Work: Your provider would like for you to have following labs drawn today (BMP, CBC, Sed Rate, CRP).     Testing/Procedures: Your physician has requested that you have an Limited Echocardiogram. Echocardiography is a painless test that uses sound waves to create images of your heart. It provides your doctor with information about the size and shape of your heart and how well your heart's chambers and valves are working.   You may receive an ultrasound enhancing agent through an IV if needed to better visualize your heart during the echo. This procedure takes approximately one hour.  There are no restrictions for this procedure.  This will take place at [address] [facility], [place] [address]  Please note: We ask at that you not bring children with you during ultrasound (echo/ vascular) testing. Due to room size and safety concerns, children are not allowed in the ultrasound rooms during exams. Our front office staff cannot provide observation of children in our lobby area while testing is being conducted. An adult accompanying a patient to their appointment will only be allowed in the ultrasound room at the discretion of the ultrasound technician under special circumstances. We apologize for any inconvenience.    Follow-Up: At [facility], you and your health needs are our priority.  As part of our continuing mission to provide you with exceptional heart care, we have created designated Provider Care Teams.  These Care Teams include your primary Cardiologist (physician) and Advanced Practice Providers (APPs -  Physician Assistants and Nurse Practitioners) who all work  together to provide you with the care you need, when you need it.  We recommend signing up for the patient portal called "MyChart".  Sign up information is provided on this After Visit Summary.  MyChart is used to connect with patients for Virtual Visits (Telemedicine).  Patients are able to view lab/test results, encounter notes, upcoming appointments, etc.  Non-urgent messages can be sent to your provider as well.   To learn more about what you can do with MyChart, go to [email or web address].    Your next appointment:   1 month(s)  Provider:   You may see Julien Nordmann, MD or one of the following Advanced Practice Providers on your designated Care Team:   Cadence Woodbury, [place]

## 2023-07-04 NOTE — Telephone Encounter (Signed)
OK for verbal orders?

## 2023-07-04 NOTE — Telephone Encounter (Signed)
Left message for [name] to provide verbal OK orders per Dr Laural Benes. Advised patient to give our office a call back if she has any questions or concerns.

## 2023-07-05 ENCOUNTER — Other Ambulatory Visit: Payer: Self-pay

## 2023-07-05 ENCOUNTER — Inpatient Hospital Stay: Payer: Medicare Other

## 2023-07-05 ENCOUNTER — Other Ambulatory Visit: Payer: Self-pay | Admitting: Oncology

## 2023-07-05 ENCOUNTER — Ambulatory Visit: Payer: Medicare Other

## 2023-07-05 ENCOUNTER — Other Ambulatory Visit: Payer: Medicare Other

## 2023-07-05 ENCOUNTER — Telehealth: Payer: Self-pay

## 2023-07-05 ENCOUNTER — Telehealth: Payer: Self-pay | Admitting: *Deleted

## 2023-07-05 VITALS — BP 103/64

## 2023-07-05 DIAGNOSIS — I5020 Unspecified systolic (congestive) heart failure: Secondary | ICD-10-CM | POA: Diagnosis not present

## 2023-07-05 DIAGNOSIS — M25531 Pain in right wrist: Secondary | ICD-10-CM | POA: Diagnosis not present

## 2023-07-05 DIAGNOSIS — I5021 Acute systolic (congestive) heart failure: Secondary | ICD-10-CM

## 2023-07-05 DIAGNOSIS — N184 Chronic kidney disease, stage 4 (severe): Secondary | ICD-10-CM

## 2023-07-05 DIAGNOSIS — E870 Hyperosmolality and hypernatremia: Secondary | ICD-10-CM | POA: Diagnosis not present

## 2023-07-05 DIAGNOSIS — I4892 Unspecified atrial flutter: Secondary | ICD-10-CM | POA: Diagnosis not present

## 2023-07-05 DIAGNOSIS — I130 Hypertensive heart and chronic kidney disease with heart failure and stage 1 through stage 4 chronic kidney disease, or unspecified chronic kidney disease: Secondary | ICD-10-CM | POA: Diagnosis not present

## 2023-07-05 DIAGNOSIS — I4891 Unspecified atrial fibrillation: Secondary | ICD-10-CM | POA: Diagnosis not present

## 2023-07-05 DIAGNOSIS — I3139 Other pericardial effusion (noninflammatory): Secondary | ICD-10-CM | POA: Diagnosis not present

## 2023-07-05 DIAGNOSIS — D631 Anemia in chronic kidney disease: Secondary | ICD-10-CM | POA: Diagnosis not present

## 2023-07-05 DIAGNOSIS — D649 Anemia, unspecified: Secondary | ICD-10-CM

## 2023-07-05 DIAGNOSIS — M1712 Unilateral primary osteoarthritis, left knee: Secondary | ICD-10-CM

## 2023-07-05 DIAGNOSIS — I213 ST elevation (STEMI) myocardial infarction of unspecified site: Secondary | ICD-10-CM

## 2023-07-05 DIAGNOSIS — D7581 Myelofibrosis: Secondary | ICD-10-CM | POA: Diagnosis not present

## 2023-07-05 DIAGNOSIS — N179 Acute kidney failure, unspecified: Secondary | ICD-10-CM | POA: Diagnosis not present

## 2023-07-05 DIAGNOSIS — F0781 Postconcussional syndrome: Secondary | ICD-10-CM | POA: Diagnosis not present

## 2023-07-05 DIAGNOSIS — I129 Hypertensive chronic kidney disease with stage 1 through stage 4 chronic kidney disease, or unspecified chronic kidney disease: Secondary | ICD-10-CM | POA: Diagnosis not present

## 2023-07-05 LAB — CBC
Hematocrit: 21.6 % — ABNORMAL LOW (ref 37.5–51.0)
Hemoglobin: 6.9 g/dL — CL (ref 13.0–17.7)
MCH: 28.6 pg (ref 26.6–33.0)
MCHC: 31.9 g/dL (ref 31.5–35.7)
MCV: 90 fL (ref 79–97)
Platelets: 281 10*3/uL (ref 150–450)
RBC: 2.41 x10E6/uL — CL (ref 4.14–5.80)
RDW: 16.9 % — ABNORMAL HIGH (ref 11.6–15.4)
WBC: 7.5 10*3/uL (ref 3.4–10.8)

## 2023-07-05 LAB — SEDIMENTATION RATE: Sed Rate: 64 mm/h — ABNORMAL HIGH (ref 0–30)

## 2023-07-05 LAB — HEMOGLOBIN AND HEMATOCRIT (CANCER CENTER ONLY)
HCT: 23.1 % — ABNORMAL LOW (ref 39.0–52.0)
Hemoglobin: 7.1 g/dL — ABNORMAL LOW (ref 13.0–17.0)

## 2023-07-05 LAB — BASIC METABOLIC PANEL
BUN/Creatinine Ratio: 17 (ref 10–24)
BUN: 55 mg/dL — ABNORMAL HIGH (ref 8–27)
CO2: 20 mmol/L (ref 20–29)
Calcium: 7.4 mg/dL — ABNORMAL LOW (ref 8.6–10.2)
Chloride: 105 mmol/L (ref 96–106)
Creatinine, Ser: 3.21 mg/dL — ABNORMAL HIGH (ref 0.76–1.27)
Glucose: 125 mg/dL — ABNORMAL HIGH (ref 70–99)
Potassium: 4.6 mmol/L (ref 3.5–5.2)
Sodium: 144 mmol/L (ref 134–144)
eGFR: 19 mL/min/{1.73_m2} — ABNORMAL LOW (ref >59)

## 2023-07-05 LAB — C-REACTIVE PROTEIN: CRP: 78 mg/L — ABNORMAL HIGH (ref 0–10)

## 2023-07-05 LAB — PREPARE RBC (CROSSMATCH)

## 2023-07-05 MED ORDER — EPOETIN ALFA-EPBX 40000 UNIT/ML IJ SOLN
40000.0000 [IU] | Freq: Once | INTRAMUSCULAR | Status: AC
Start: 2023-07-05 — End: 2023-07-05
  Administered 2023-07-05: 40000 [IU] via SUBCUTANEOUS

## 2023-07-05 MED ORDER — EPOETIN ALFA-EPBX 10000 UNIT/ML IJ SOLN
10000.0000 [IU] | Freq: Once | INTRAMUSCULAR | Status: AC
Start: 2023-07-05 — End: 2023-07-05
  Administered 2023-07-05: 10000 [IU] via SUBCUTANEOUS

## 2023-07-05 NOTE — Patient Instructions (Addendum)
Visit Information  Thank you for taking time to visit with me today. Please don't hesitate to contact me if I can be of assistance to you before our next scheduled telephone appointment.  Ambulatory Referral  for identified SDOH needs. Please see additional details in the order section of this note  Patient / Caregiver was made aware   The Complex Care Management services include support from the care team which includes  a  Nurse Coordinator, Clinical Social Worker, and /  or Pharmacist. To assist wit medication needs and concerns  The Complex Care Management team's purpose is to assist in removing   barriers to your health care and to assist  you  in achieving  the goals  that are most important to your wellbeing Complex Care Management services are voluntary, and the patient / caregiver may decline or stop services at any time by request to their care team member.  Our next appointment is by telephone on 2/5 at 1:30pm  Following is a copy of your care plan:   Goals Addressed             This Visit's Progress    TOC Care Plan       Current Barriers:  Chronic Disease Management support and education needs related to Atrial Fibrillation   RNCM Clinical Goal(s):  Patient will take all medications exactly as prescribed and will call provider for medication related questions as evidenced by no missed medication doses  attend all scheduled medical appointments: with PCP and Cardiology  as evidenced by no missed follow-up visits- Cardiology 1/28 PCP 1/6 , Oncology Dr Rao,1/30 possible transfusion  continue to work with RN Care Manager to address care management and care coordination needs related to  Atrial Fibrillation as evidenced by adherence to CM Team Scheduled appointments through collaboration with RN Care manager, provider, and care team.   Interventions: Evaluation of current treatment plan related to  self management and patient's adherence to plan as established by  provider  Transitions of Care:  Goal on track:  Yes. Durable Medical Equipment (DME) needs assessed with patient/caregiver, reviewed with patient/caregiver, and orders reviewed Doctor Visits  - discussed the importance of doctor visits Health /OT/PT Lu Duffel  also seeing [facility] PT Post discharge activity limitations prescribed by provider reviewed  AFIB Interventions: (Status:  Goal on track:  Yes.) Short Term Goal   Reviewed importance of adherence to anticoagulant exactly as prescribed Advised patient to discuss Medications  with provider Counseled on seeking medical attention after a head injury or if there is blood in the urine/stool Screening for signs and symptoms of depression related to chronic disease state  Assessed social determinant of health barriers  Patient Goals/Self-Care Activities: Participate in Transition of Care Program/Attend TOC scheduled calls Take all medications as prescribed Attend all scheduled provider appointments Call pharmacy for medication refills 3-7 days in advance of running out of medications Attend church or other social activities Perform all self care activities independently  Perform IADL's (shopping, preparing meals, housekeeping, managing finances) independently Call provider office for new concerns or questions   Follow Up Plan:  Telephone follow up appointment with care management team member scheduled for:  07/11/23 @ 1:00pm The patient has been provided with contact information for the care management team and has been advised to call with any health related questions or concerns.          Medication review  Reviewed current home medications -- provided education as needed. Patient is aware of  potential side effects and was encouraged to notify PCP for any adverse side effects or unwanted symptoms not relieved with interventions  Patient will call 911 for Medical Emergencies or Life -Threatening Symptoms.  Reviewed goals for  care Patient/ Caregiver verbalizes understanding of instructions with the plan of care . The  Patient / Caregiver was encouraged to make informed decisions about care, actively participate in managing health conditions, and implement lifestyle changes as needed to promote independence and self-management of healthcare. SDOH screenings have been completed and addressed if indicted.  There are no reported barriers to care.    Follow-up Plan [facility] Case Management Nurse will provide follow-up and on-going assessment ,evaluation and education of disease processes, recommended interventions for both chronic and acute medical conditions ,  along with ongoing review of symptoms ,medication reviews / reconciliation during each weekly call . Any updates , inconsistencies, discrepancies or acute care concerns will be addressed and routed to the correct Practitioner if indicated   [facility]  Please call the care guide team at [phone]  if you need to cancel or reschedule your appointment . For scheduled calls -Three attempts will be made to reach you -if the scheduled call is missed or  we are unable to reach the you after 3 attempts no additional outreach attempts will be made and the TOC follow-up will be closed .   If you need to speak to a Nurse you may  call me directly at the number below or if I am unavailable,and  your need is urgent  please call the main [facility]BCI number at [phone] and ask to speak with one of the [facility] ( Transition of Care )  Nurses  .  Patient was encouraged to Contact PCP with any changes in baseline or  medication regimen,  changes in health status  /  well-being, safety concerns, including falls any questions or concerns regarding ongoing medical care, any difficulty obtaining or picking up prescriptions, any changes or worsening in condition- including  symptoms not relieved  with interventions                                                                             Additionally, If you experience worsening of your symptoms, develop shortness of breath, If you are experiencing a medical emergency,  develop suicidal or homicidal thoughts you must seek medical attention immediately by calling 911 or report to your local emergency department or urgent care.   If you have a non-emergency medical problem during routine business hours, please contact your provider's office and ask to speak with a nurse.       Please take the time to read instructions/literature along with the possible adverse reactions/side effects for all the Medicines that have been prescribed to you. Only take newly prescribed  Medications after you have completely understood and accept all the possible adverse reactions/side effects.   Do not take more than prescribed Medications for  Pain, Sleep and Anxiety. Do not drive when taking Pain medications or sleep aid/ insomnia  medications It is not advisable to combine anxiety, sleep and pain medications without talking with your primary care practitioner    If you are experiencing a  Mental Health or Behavioral Health Crisis or need someone to talk to Please call the Suicide and Crisis Lifeline: 68 You may also call the [place] National Suicide Prevention Lifeline: 213-071-8826 or TTY: 732-025-0752 TTY 317-810-9377) to talk to a trained counselor.  You may call the Behavioral Health Crisis Line at 501-130-7637, at any time, 24 hours a day, 7 days a week- however If you are in danger or need immediate medical attention, call 911.   If you would like help to quit smoking, call 1-800-QUIT-NOW ( 785-751-7383) OR Espaol: 1-855-Djelo-Ya (7-253-664-4034) o para ms informacin haga clic aqu or Text READY to 742-595 to register via text.   Susa Loffler , BSN, RN [place]   VBCI-Population Health RN Care Manager Direct Dial [phone]  Website: [name]

## 2023-07-05 NOTE — Telephone Encounter (Signed)
Crystal with [employer] called to give critical results. Informed her provider did receive this alert and nurse called to inform pt.

## 2023-07-05 NOTE — Telephone Encounter (Signed)
Per the wife they saw cardiologist and the hgb6.9 and he thinks that the pt should get another unit of blood. The wife is asking should she take him to  ER or can he come to cancer center.

## 2023-07-05 NOTE — Progress Notes (Signed)
Patient was seen in clinic yesterday by Cadence Furth, PA with minimal symptoms.  Chemistry revealed stable serum creatinine.  Blood counts only up to 6.9 after recent transfusion and oncology.  Recommend patient reach out to oncology for further transfusion or proceed to the emergency department for further evaluation and workup. Recommend holding Eliquis until blood counts are more stable. Recommend further anticoagulation recommendations managed by primary cardiologist or Fransico Michael, [place].

## 2023-07-05 NOTE — Telephone Encounter (Signed)
The pt and wife were informed of Sheri's recommendations and expressed that the patient is still currently symptomatic. They verbalized understanding regarding holding Eliquis and mentioned they would report to the ER."  July 05, 2023 Charlsie Quest, NP to Me      07/05/23  8:20 AM Result Note Patient was seen in clinic yesterday by Cadence Furth, PA with minimal symptoms.  Chemistry revealed stable serum creatinine.  Blood counts only up to 6.9 after recent transfusion and oncology.  Recommend patient reach out to oncology for further transfusion or proceed to the emergency department for further evaluation and workup. Recommend holding Eliquis until blood counts are more stable. Recommend further anticoagulation recommendations managed by primary cardiologist or Fransico Michael, [place].

## 2023-07-05 NOTE — Patient Outreach (Addendum)
Care Management  Transitions of Care Program Transitions of Care Post-discharge week 4   07/05/2023 Name: [name] MRN: [id number] DOB: Aug 23, 1940  Subjective: [name] is a 83 y.o. year old male who is a primary care patient of [name], DO. The Care Management team Engaged with patient Engaged with patient by telephone to assess and address transitions of care needs.   Consent to Services:  Patient was given information about care management services, agreed to services, and gave verbal consent to participate.   Assessment:   Patient/ Caregiver  voices no new complaints or concerns  and has not developed/ reported any new medical issues / Dx or acute changes. - since last follow-up call for most recent  Hospital stay     06/02/23-1/3 / 2025 Per spouse there have been some setbacks. He had lab work on Friday and she received a call today he needed to [facility] the ER for a possible transfusion She called Oncology and was able to get patient an appointment for 1/30 for follow-up labs and possible transfusion He is weak, beginning to verbalize s/s depression,  lack motivation, She had a fall this am assisting him  and required son to " pick her up off floor" He is followed by [facility] PT and OT Nursing was added but has not seen patient yet.  He reports generalized pain and is using Tramadol with some effect . They both have appt with Dr Laural Benes 07/07/23  Medication reconciliation/ review completed based on most recent medication list in EHR; and in review of recent Provider follow-up appointments- confirmed patient obtained / is taking all newly prescribed medications as instructed and is aware of any changes to previous medications regimen including dosage adjustments. Patient self-Caregiver manages medications; denies questions/ concerns or barriers to medication adherence at this time  Patient / Caregiver educated on red flag s/s to watch for based on current discharge diagnosis  and was encouraged to report, any changes in baseline or  medication regimen,   or any new unmanaged side effects or symptoms not relieved with interventions  to PCP and / or the  [facility]BCI Case Management team .          SDOH Interventions    Flowsheet Row Telephone from 06/12/2023 in [place] [facility] Telephone from 04/25/2023 in [place] [facility] Office Visit from 11/02/2021 in [facility] Clinical Support from 07/12/2021 in [facility] Office Visit from 10/18/2019 in [facility]  SDOH Interventions       Food Insecurity Interventions Intervention Not Indicated Intervention Not Indicated -- Intervention Not Indicated --  Housing Interventions Intervention Not Indicated Intervention Not Indicated -- Intervention Not Indicated --  Transportation Interventions Intervention Not Indicated, Patient Resources (Friends/Family) Intervention Not Indicated, Patient Resources Dietitian) -- Intervention Not Indicated --  Utilities Interventions Intervention Not Indicated Intervention Not Indicated -- -- --  Depression Interventions/Treatment  -- -- Medication, Currently on Treatment -- Medication  Financial Strain Interventions -- -- -- Intervention Not Indicated --  Physical Activity Interventions -- -- -- Intervention Not Indicated --  Stress Interventions -- -- -- Intervention Not Indicated --  Social Connections Interventions Intervention Not Indicated -- -- Intervention Not Indicated --     Ambulatory Referral  for identified SDOH needs. Please see additional details in the order section of this note  Patient / Caregiver was made aware   The Complex Care Management services include  support from the care team which includes  a  Nurse Coordinator, Clinical Social Worker, and /  or Pharmacist. To assist wit medication needs and concerns  The Complex Care Management team's purpose is to  assist in removing   barriers to your health care and to assist  you  in achieving  the goals  that are most important to your wellbeing Complex Care Management services are voluntary, and the patient / caregiver may decline or stop services at any time by request to their care team member.   Goals Addressed             This Visit's Progress    TOC Care Plan       Current Barriers:  Chronic Disease Management support and education needs related to Atrial Fibrillation   RNCM Clinical Goal(s):  Patient will take all medications exactly as prescribed and will call provider for medication related questions as evidenced by no missed medication doses  attend all scheduled medical appointments: with PCP and Cardiology  as evidenced by no missed follow-up visits- Cardiology 1/28 PCP 1/6 , Oncology Dr Rao,1/30 possible transfusion  continue to work with RN Care Manager to address care management and care coordination needs related to  Atrial Fibrillation as evidenced by adherence to CM Team Scheduled appointments through collaboration with RN Care manager, provider, and care team.   Interventions: Evaluation of current treatment plan related to  self management and patient's adherence to plan as established by provider  Transitions of Care:  Goal on track:  Yes. Durable Medical Equipment (DME) needs assessed with patient/caregiver, reviewed with patient/caregiver, and orders reviewed Doctor Visits  - discussed the importance of doctor visits Health /OT/PT Lu Duffel  also seeing [facility] Post discharge activity limitations prescribed by provider reviewed  AFIB Interventions: (Status:  Goal on track:  Yes.) Short Term Goal   Reviewed importance of adherence to anticoagulant exactly as prescribed Advised patient to discuss Medications  with provider Counseled on seeking medical attention after a head injury or if there is blood in the urine/stool Screening for signs and symptoms of depression  related to chronic disease state  Assessed social determinant of health barriers  Patient Goals/Self-Care Activities: Participate in Transition of Care Program/Attend TOC scheduled calls Take all medications as prescribed Attend all scheduled provider appointments Call pharmacy for medication refills 3-7 days in advance of running out of medications Attend church or other social activities Perform all self care activities independently  Perform IADL's (shopping, preparing meals, housekeeping, managing finances) independently Call provider office for new concerns or questions   Follow Up Plan:  Telephone follow up appointment with care management team member scheduled for:  07/11/23 @ 1:00pm The patient has been provided with contact information for the care management team and has been advised to call with any health related questions or concerns.          Plan:  Call placed to [facility] to verify they could accept referral for spouse and that Nursing would see patient ASAP. Call placed to PCP to update them in transfusion status and spouses need for referral to [facility]  Routine follow-up and on-going assessment evaluation and education of disease processes, recommended interventions for both chronic and acute medical conditions , will occur during each weekly visit along with ongoing review of symptoms ,medication reviews and reconciliation. Any updates , inconsistencies, discrepancies or acute care concerns will be addressed and routed to the correct Practitioner if indicated   Based on current information  and Insurance plan -Reviewed benefits available to patient, including details about eligibility options for care if any area of needs were identified.  Reviewed patients ability to access and / or navigating the benefits system..Amb Referral made if indicted , refer to orders section of note for details   Please refer to Care Plan for goals and interventions -Effectiveness of interventions,  symptom management and outcomes will be evaluated  weekly during [facility] 30-day Program Outreach calls  . Any necessary  changes and updates to Care Plan will be completed episodically    Reviewed goals for care Patient verbalizes understanding of instructions and care plan provided. Patient was encouraged to make informed decisions about their care, actively participate in managing their health condition, and implement lifestyle changes as needed to promote independence and self-management of health care   Patient was encouraged to Contact PCP with any changes in baseline or  medication regimen,  changes in health status  /  well-being, safety concerns, including falls any questions or concerns regarding ongoing medical care, any difficulty obtaining or picking up prescriptions, any changes or worsening in condition- including  symptoms not relieved  with interventions    The patient has been provided with contact information for the care management team and has been advised to call with any health-related questions or concerns. Follow up as indicated with Care Team , or sooner should any new problems arise.    Susa Loffler , BSN, RN [facility]   VBCI-Population Health RN Care Manager Direct Dial [phone]  Fax: [phone] Website: [name]

## 2023-07-05 NOTE — Telephone Encounter (Signed)
Called the wife and let her know that pt need to come on 1/30 to get labs and injection and if the hgb is low then pt can get transfusion and I told all of this to his wife.

## 2023-07-05 NOTE — Addendum Note (Signed)
Addended by: Johnnette Barrios on: 07/05/2023 02:51 PM   Modules accepted: Orders

## 2023-07-06 ENCOUNTER — Inpatient Hospital Stay: Payer: Medicare Other

## 2023-07-06 ENCOUNTER — Telehealth: Payer: Self-pay | Admitting: Cardiovascular Disease

## 2023-07-06 ENCOUNTER — Telehealth: Payer: Self-pay | Admitting: *Deleted

## 2023-07-06 DIAGNOSIS — D631 Anemia in chronic kidney disease: Secondary | ICD-10-CM | POA: Diagnosis not present

## 2023-07-06 DIAGNOSIS — I129 Hypertensive chronic kidney disease with stage 1 through stage 4 chronic kidney disease, or unspecified chronic kidney disease: Secondary | ICD-10-CM | POA: Diagnosis not present

## 2023-07-06 DIAGNOSIS — N184 Chronic kidney disease, stage 4 (severe): Secondary | ICD-10-CM | POA: Diagnosis not present

## 2023-07-06 DIAGNOSIS — D649 Anemia, unspecified: Secondary | ICD-10-CM

## 2023-07-06 MED ORDER — ACETAMINOPHEN 325 MG PO TABS
650.0000 mg | ORAL_TABLET | Freq: Once | ORAL | Status: AC
  Administered 2023-07-06: 650 mg via ORAL
  Filled 2023-07-06: qty 2

## 2023-07-06 NOTE — Progress Notes (Signed)
Complex Care Management Note  Care Guide Note 07/06/2023 Name: [name] MRN: [id number] DOB: [birth year]  [name] is a 83 y.o. year old male who sees [name], DO for primary care. I reached out to [name] by phone today to offer complex care management services.  [name] was given information about Complex Care Management services today including:   The Complex Care Management services include support from the care team which includes your Nurse Coordinator, Clinical Social Worker, or Pharmacist.  The Complex Care Management team is here to help remove barriers to the health concerns and goals most important to you. Complex Care Management services are voluntary, and the patient may decline or stop services at any time by request to their care team member.   Complex Care Management Consent Status: Patient agreed to services and verbal consent obtained.   Follow up plan:  Telephone appointment with complex care management team member scheduled for:  07/12/2023  Encounter Outcome:  Patient Scheduled  Burman Nieves, CMA, Care Guide [facility]  [facility], [facility] Guide Direct Dial: [phone]  Fax: [phone] Website: [email or web address]

## 2023-07-06 NOTE — Telephone Encounter (Signed)
Caller [employer]) is following-up on status of patient's clearance.

## 2023-07-06 NOTE — Telephone Encounter (Signed)
I d/w Cadence Furth, PAC who saw the pt on 07/04/23. Per PAC she needs to d/w cardiologist further before she can sign off clearance notes. Once she has cleared the pt she will send her notes and any recommendations.   I assured Cadence Fransico Michael, PAC that I will update the surgeon office as FYI.

## 2023-07-06 NOTE — Progress Notes (Signed)
Per Dr. Smith Robert, pt to receive only 1 unit PRBCs at this time.

## 2023-07-06 NOTE — Telephone Encounter (Signed)
I will follow up with Cadence Furth, PAC in regard to if she has cleared the pt. Requesting office has called our office today inquiring.

## 2023-07-07 ENCOUNTER — Ambulatory Visit (INDEPENDENT_AMBULATORY_CARE_PROVIDER_SITE_OTHER): Payer: Medicare Other | Admitting: Family Medicine

## 2023-07-07 ENCOUNTER — Inpatient Hospital Stay: Payer: Medicare Other

## 2023-07-07 ENCOUNTER — Encounter: Payer: Self-pay | Admitting: Family Medicine

## 2023-07-07 VITALS — BP 115/64 | HR 73

## 2023-07-07 DIAGNOSIS — Z79890 Hormone replacement therapy (postmenopausal): Secondary | ICD-10-CM | POA: Diagnosis not present

## 2023-07-07 DIAGNOSIS — Z1152 Encounter for screening for COVID-19: Secondary | ICD-10-CM | POA: Diagnosis not present

## 2023-07-07 DIAGNOSIS — N3001 Acute cystitis with hematuria: Secondary | ICD-10-CM | POA: Diagnosis present

## 2023-07-07 DIAGNOSIS — R4182 Altered mental status, unspecified: Secondary | ICD-10-CM | POA: Diagnosis not present

## 2023-07-07 DIAGNOSIS — G9340 Encephalopathy, unspecified: Secondary | ICD-10-CM | POA: Diagnosis not present

## 2023-07-07 DIAGNOSIS — D631 Anemia in chronic kidney disease: Secondary | ICD-10-CM | POA: Diagnosis not present

## 2023-07-07 DIAGNOSIS — I5022 Chronic systolic (congestive) heart failure: Secondary | ICD-10-CM | POA: Diagnosis present

## 2023-07-07 DIAGNOSIS — J9811 Atelectasis: Secondary | ICD-10-CM | POA: Diagnosis not present

## 2023-07-07 DIAGNOSIS — R0789 Other chest pain: Secondary | ICD-10-CM | POA: Diagnosis not present

## 2023-07-07 DIAGNOSIS — I130 Hypertensive heart and chronic kidney disease with heart failure and stage 1 through stage 4 chronic kidney disease, or unspecified chronic kidney disease: Secondary | ICD-10-CM | POA: Diagnosis not present

## 2023-07-07 DIAGNOSIS — N390 Urinary tract infection, site not specified: Secondary | ICD-10-CM | POA: Diagnosis not present

## 2023-07-07 DIAGNOSIS — Z7952 Long term (current) use of systemic steroids: Secondary | ICD-10-CM | POA: Diagnosis not present

## 2023-07-07 DIAGNOSIS — F39 Unspecified mood [affective] disorder: Secondary | ICD-10-CM | POA: Diagnosis present

## 2023-07-07 DIAGNOSIS — R443 Hallucinations, unspecified: Secondary | ICD-10-CM

## 2023-07-07 DIAGNOSIS — Z1624 Resistance to multiple antibiotics: Secondary | ICD-10-CM | POA: Diagnosis present

## 2023-07-07 DIAGNOSIS — M1120 Other chondrocalcinosis, unspecified site: Secondary | ICD-10-CM | POA: Diagnosis present

## 2023-07-07 DIAGNOSIS — I252 Old myocardial infarction: Secondary | ICD-10-CM | POA: Diagnosis not present

## 2023-07-07 DIAGNOSIS — Z888 Allergy status to other drugs, medicaments and biological substances status: Secondary | ICD-10-CM | POA: Diagnosis not present

## 2023-07-07 DIAGNOSIS — I5020 Unspecified systolic (congestive) heart failure: Secondary | ICD-10-CM | POA: Diagnosis not present

## 2023-07-07 DIAGNOSIS — Z79899 Other long term (current) drug therapy: Secondary | ICD-10-CM | POA: Diagnosis not present

## 2023-07-07 DIAGNOSIS — Z85820 Personal history of malignant melanoma of skin: Secondary | ICD-10-CM | POA: Diagnosis not present

## 2023-07-07 DIAGNOSIS — N184 Chronic kidney disease, stage 4 (severe): Secondary | ICD-10-CM | POA: Diagnosis present

## 2023-07-07 DIAGNOSIS — I129 Hypertensive chronic kidney disease with stage 1 through stage 4 chronic kidney disease, or unspecified chronic kidney disease: Secondary | ICD-10-CM | POA: Diagnosis not present

## 2023-07-07 DIAGNOSIS — G928 Other toxic encephalopathy: Secondary | ICD-10-CM | POA: Diagnosis present

## 2023-07-07 DIAGNOSIS — D7581 Myelofibrosis: Secondary | ICD-10-CM | POA: Diagnosis not present

## 2023-07-07 DIAGNOSIS — Z8673 Personal history of transient ischemic attack (TIA), and cerebral infarction without residual deficits: Secondary | ICD-10-CM | POA: Diagnosis not present

## 2023-07-07 DIAGNOSIS — I440 Atrioventricular block, first degree: Secondary | ICD-10-CM | POA: Diagnosis not present

## 2023-07-07 DIAGNOSIS — Z96611 Presence of right artificial shoulder joint: Secondary | ICD-10-CM | POA: Diagnosis present

## 2023-07-07 DIAGNOSIS — I4892 Unspecified atrial flutter: Secondary | ICD-10-CM | POA: Diagnosis present

## 2023-07-07 DIAGNOSIS — Z87891 Personal history of nicotine dependence: Secondary | ICD-10-CM | POA: Diagnosis not present

## 2023-07-07 DIAGNOSIS — K219 Gastro-esophageal reflux disease without esophagitis: Secondary | ICD-10-CM | POA: Diagnosis present

## 2023-07-07 DIAGNOSIS — J90 Pleural effusion, not elsewhere classified: Secondary | ICD-10-CM | POA: Diagnosis not present

## 2023-07-07 DIAGNOSIS — N400 Enlarged prostate without lower urinary tract symptoms: Secondary | ICD-10-CM | POA: Diagnosis present

## 2023-07-07 DIAGNOSIS — N179 Acute kidney failure, unspecified: Secondary | ICD-10-CM | POA: Diagnosis not present

## 2023-07-07 DIAGNOSIS — M25531 Pain in right wrist: Secondary | ICD-10-CM

## 2023-07-07 DIAGNOSIS — R079 Chest pain, unspecified: Secondary | ICD-10-CM | POA: Diagnosis not present

## 2023-07-07 DIAGNOSIS — I4891 Unspecified atrial fibrillation: Secondary | ICD-10-CM | POA: Diagnosis not present

## 2023-07-07 DIAGNOSIS — F0781 Postconcussional syndrome: Secondary | ICD-10-CM | POA: Diagnosis not present

## 2023-07-07 DIAGNOSIS — I3139 Other pericardial effusion (noninflammatory): Secondary | ICD-10-CM | POA: Diagnosis not present

## 2023-07-07 DIAGNOSIS — M436 Torticollis: Secondary | ICD-10-CM

## 2023-07-07 DIAGNOSIS — Z7901 Long term (current) use of anticoagulants: Secondary | ICD-10-CM | POA: Diagnosis not present

## 2023-07-07 DIAGNOSIS — E870 Hyperosmolality and hypernatremia: Secondary | ICD-10-CM | POA: Diagnosis not present

## 2023-07-07 DIAGNOSIS — E785 Hyperlipidemia, unspecified: Secondary | ICD-10-CM | POA: Diagnosis present

## 2023-07-07 LAB — BPAM RBC
Blood Product Expiration Date: 202502152359
ISSUE DATE / TIME: 202501301025
Unit Type and Rh: 5100

## 2023-07-07 LAB — TYPE AND SCREEN
ABO/RH(D): O POS
Antibody Screen: NEGATIVE
Unit division: 0

## 2023-07-07 MED ORDER — MELATONIN 3 MG PO TABS: 3.0000 mg | ORAL_TABLET | Freq: Every evening | ORAL | 3 refills | Status: DC | PRN

## 2023-07-07 MED ORDER — METOPROLOL SUCCINATE ER 100 MG PO TB24: 100.0000 mg | ORAL_TABLET | Freq: Two times a day (BID) | ORAL | 1 refills | Status: DC

## 2023-07-07 MED ORDER — CYCLOBENZAPRINE HCL 10 MG PO TABS: 10.0000 mg | ORAL_TABLET | Freq: Every day | ORAL | 0 refills | Status: DC

## 2023-07-07 MED ORDER — QUETIAPINE FUMARATE 25 MG PO TABS: 25.0000 mg | ORAL_TABLET | Freq: Every day | ORAL | 0 refills | Status: DC

## 2023-07-07 NOTE — Progress Notes (Signed)
Notes will be sent to surgeon office

## 2023-07-07 NOTE — Telephone Encounter (Signed)
I will fax notes to requesting office.  ?

## 2023-07-07 NOTE — Progress Notes (Unsigned)
BP 115/64   Pulse 73   SpO2 92%    Subjective:    Patient ID: [name], male    DOB: 30-Dec-1940, 83 y.o.   MRN: [id number]  HPI: [name] is a 83 y.o. male  Chief Complaint  Patient presents with  . Wrist Pain  . Hallucinations    Patient wife says patient has been seeing things that isn't there lately, hearing voices and talking during his sleep. Patient wife says he is getting back to how he was before.   . Neck Pain    Patient neck has been bothering him so back that he can barely move it and says it has now radiated into the head and his ears.   . Tremors    Patient says he has noticed some "jerking" and says it started this morning and it is pretty bad.  . Shoulder Pain    Patient says he noticed some R shoulder pain this morning, says he felt he was getting hit in the shoulder and couldn't wake his wife up to help him this morning. Patient says he wet the bed this morning.    Wrist is bettr. Did not see the orthopedist. Swelling is better. Feeling more   NECK PAIN  Duration: 2-3 days Status: {Blank single:19197::"controlled","uncontrolled","better","worse","exacerbated","stable","resolved"} Treatments attempted: {Blank multiple:19196::"none","rest","ice","heat","APAP","ibuprofen","aleve","muscle relaxer","physical therapy","HEP","OMM"}  Compliant with recommended treatment: {Blank single:19197::"yes","no","average"} Relief with NSAIDs?:  {Blank single:19197::"No NSAIDs Taken","no","mild","moderate","significant"} Location:{Blank multiple:19196::"Right","Left","R>L","L>R","midline"} Duration:{Blank single:19197::"chronic","days","weeks","months"} Severity: {Blank single:19197::"mild","moderate","severe","1/10","2/10","3/10","4/10","5/10","6/10","7/10","8/10","9/10","10/10"} Quality: {Blank  multiple:19196::"sharp","dull","aching","burning","cramping","ill-defined","itchy","pressure-like","pulling","shooting","sore","stabbing","tender","tearing","throbbing"} Frequency: {Blank single:19197::"constant","intermittent","occasional","rare","every few minutes","a few times a hour","a few times a day","a few times a week","a few times a month","a few times a year"} Radiation: {Blank single:19197::"none","yes","R arm","L arm","low back","headache"} Aggravating factors: {Blank multiple:19196::"none","lifting","movement","walking","laying","bending","prolonged sitting","coughing","valsalva","Pain increased with coughing/valsalva"} Alleviating factors: {Blank multiple:19196::"nothing","rest","ice","heat","laying","NSAIDs","APAP","narcotics","muscle relaxer"} Weakness:  {Blank single:19197::"yes","no"} Paresthesias / decreased sensation:  {Blank single:19197::"yes","no"}  Fevers:  {Blank single:19197::"yes","no"}   Relevant past medical, surgical, family and social history reviewed and updated as indicated. Interim medical history since our last visit reviewed. Allergies and medications reviewed and updated.  Review of Systems  Constitutional:  Positive for fatigue. Negative for activity change, appetite change, chills, diaphoresis, fever and unexpected weight change.  HENT: Negative.    Respiratory:  Positive for shortness of breath. Negative for apnea, cough, choking, chest tightness, wheezing and stridor.   Cardiovascular:  Positive for chest pain and leg swelling. Negative for palpitations.  Musculoskeletal:  Positive for myalgias, neck pain and neck stiffness. Negative for arthralgias, back pain, gait problem and joint swelling.  Skin: Negative.   Neurological: Negative.   Psychiatric/Behavioral:  Positive for confusion, hallucinations and sleep disturbance. Negative for agitation, behavioral problems, decreased concentration, dysphoric mood, self-injury and suicidal ideas. The patient is  nervous/anxious. The patient is not hyperactive.     Per HPI unless specifically indicated above     Objective:    BP 115/64   Pulse 73   SpO2 92%   Wt Readings from Last 3 Encounters:  07/04/23 172 lb 3.2 oz (78.1 kg)  06/30/23 172 lb 3.2 oz (78.1 kg)  06/29/23 168 lb (76.2 kg)    Physical Exam Vitals and nursing note reviewed.  Constitutional:      General: He is not in acute distress.    Appearance: Normal appearance. He is ill-appearing. He is not toxic-appearing or diaphoretic.  HENT:     Head: Normocephalic and atraumatic.     Right Ear: External ear normal.     Left Ear: External ear normal.     Nose: Nose normal.  Mouth/Throat:     Mouth: Mucous membranes are moist.     Pharynx: Oropharynx is clear.  Eyes:     General: No scleral icterus.       Right eye: No discharge.        Left eye: No discharge.     Extraocular Movements: Extraocular movements intact.     Conjunctiva/sclera: Conjunctivae normal.     Pupils: Pupils are equal, round, and reactive to light.  Cardiovascular:     Rate and Rhythm: Normal rate and regular rhythm.     Pulses: Normal pulses.     Heart sounds: Normal heart sounds. No murmur heard.    No friction rub. No gallop.  Pulmonary:     Effort: Pulmonary effort is normal. No respiratory distress.     Breath sounds: Normal breath sounds. No stridor. No wheezing, rhonchi or rales.  Chest:     Chest wall: No tenderness.  Musculoskeletal:        General: Tenderness present.     Cervical back: Normal range of motion and neck supple.  Skin:    General: Skin is warm and dry.     Capillary Refill: Capillary refill takes less than 2 seconds.     Coloration: Skin is pale. Skin is not jaundiced.     Findings: No bruising, erythema, lesion or rash.  Neurological:     General: No focal deficit present.     Mental Status: He is oriented to person, place, and time. Mental status is at baseline. He is lethargic.     Motor: Weakness present.   Psychiatric:        Attention and Perception: He perceives auditory and visual hallucinations.        Mood and Affect: Mood normal.        Behavior: Behavior normal.        Thought Content: Thought content normal.        Judgment: Judgment normal.    Results for orders placed or performed in visit on 07/05/23  Hemoglobin and Hematocrit (Cancer Center Only)   Collection Time: 07/05/23  3:13 PM  Result Value Ref Range   Hemoglobin 7.1 (L) 13.0 - 17.0 g/dL   HCT 16.1 (L) 09.6 - 04.5 %      Assessment & Plan:   Problem List Items Addressed This Visit   None    Follow up plan: No follow-ups on file.

## 2023-07-08 DIAGNOSIS — Z7901 Long term (current) use of anticoagulants: Secondary | ICD-10-CM | POA: Diagnosis not present

## 2023-07-08 DIAGNOSIS — R4182 Altered mental status, unspecified: Secondary | ICD-10-CM | POA: Diagnosis not present

## 2023-07-08 DIAGNOSIS — Z87891 Personal history of nicotine dependence: Secondary | ICD-10-CM | POA: Diagnosis not present

## 2023-07-08 DIAGNOSIS — N400 Enlarged prostate without lower urinary tract symptoms: Secondary | ICD-10-CM | POA: Diagnosis present

## 2023-07-08 DIAGNOSIS — N3001 Acute cystitis with hematuria: Secondary | ICD-10-CM | POA: Diagnosis present

## 2023-07-08 DIAGNOSIS — F0781 Postconcussional syndrome: Secondary | ICD-10-CM | POA: Diagnosis not present

## 2023-07-08 DIAGNOSIS — J90 Pleural effusion, not elsewhere classified: Secondary | ICD-10-CM | POA: Diagnosis not present

## 2023-07-08 DIAGNOSIS — Z7952 Long term (current) use of systemic steroids: Secondary | ICD-10-CM | POA: Diagnosis not present

## 2023-07-08 DIAGNOSIS — I4892 Unspecified atrial flutter: Secondary | ICD-10-CM | POA: Diagnosis present

## 2023-07-08 DIAGNOSIS — I129 Hypertensive chronic kidney disease with stage 1 through stage 4 chronic kidney disease, or unspecified chronic kidney disease: Secondary | ICD-10-CM | POA: Diagnosis not present

## 2023-07-08 DIAGNOSIS — Z79899 Other long term (current) drug therapy: Secondary | ICD-10-CM | POA: Diagnosis not present

## 2023-07-08 DIAGNOSIS — I4891 Unspecified atrial fibrillation: Secondary | ICD-10-CM | POA: Diagnosis present

## 2023-07-08 DIAGNOSIS — M25531 Pain in right wrist: Secondary | ICD-10-CM | POA: Diagnosis not present

## 2023-07-08 DIAGNOSIS — I5020 Unspecified systolic (congestive) heart failure: Secondary | ICD-10-CM | POA: Diagnosis not present

## 2023-07-08 DIAGNOSIS — Z96611 Presence of right artificial shoulder joint: Secondary | ICD-10-CM | POA: Diagnosis present

## 2023-07-08 DIAGNOSIS — Z1624 Resistance to multiple antibiotics: Secondary | ICD-10-CM | POA: Diagnosis present

## 2023-07-08 DIAGNOSIS — Z888 Allergy status to other drugs, medicaments and biological substances status: Secondary | ICD-10-CM | POA: Diagnosis not present

## 2023-07-08 DIAGNOSIS — I5022 Chronic systolic (congestive) heart failure: Secondary | ICD-10-CM | POA: Diagnosis present

## 2023-07-08 DIAGNOSIS — G9340 Encephalopathy, unspecified: Secondary | ICD-10-CM | POA: Diagnosis not present

## 2023-07-08 DIAGNOSIS — I252 Old myocardial infarction: Secondary | ICD-10-CM | POA: Diagnosis not present

## 2023-07-08 DIAGNOSIS — D631 Anemia in chronic kidney disease: Secondary | ICD-10-CM | POA: Diagnosis not present

## 2023-07-08 DIAGNOSIS — K219 Gastro-esophageal reflux disease without esophagitis: Secondary | ICD-10-CM | POA: Diagnosis present

## 2023-07-08 DIAGNOSIS — E785 Hyperlipidemia, unspecified: Secondary | ICD-10-CM | POA: Diagnosis present

## 2023-07-08 DIAGNOSIS — Z85820 Personal history of malignant melanoma of skin: Secondary | ICD-10-CM | POA: Diagnosis not present

## 2023-07-08 DIAGNOSIS — F39 Unspecified mood [affective] disorder: Secondary | ICD-10-CM | POA: Diagnosis present

## 2023-07-08 DIAGNOSIS — I3139 Other pericardial effusion (noninflammatory): Secondary | ICD-10-CM | POA: Diagnosis not present

## 2023-07-08 DIAGNOSIS — D7581 Myelofibrosis: Secondary | ICD-10-CM | POA: Diagnosis not present

## 2023-07-08 DIAGNOSIS — G928 Other toxic encephalopathy: Secondary | ICD-10-CM | POA: Diagnosis present

## 2023-07-08 DIAGNOSIS — Z1152 Encounter for screening for COVID-19: Secondary | ICD-10-CM | POA: Diagnosis not present

## 2023-07-08 DIAGNOSIS — I130 Hypertensive heart and chronic kidney disease with heart failure and stage 1 through stage 4 chronic kidney disease, or unspecified chronic kidney disease: Secondary | ICD-10-CM | POA: Diagnosis present

## 2023-07-08 DIAGNOSIS — E870 Hyperosmolality and hypernatremia: Secondary | ICD-10-CM | POA: Diagnosis not present

## 2023-07-08 DIAGNOSIS — Z79890 Hormone replacement therapy (postmenopausal): Secondary | ICD-10-CM | POA: Diagnosis not present

## 2023-07-08 DIAGNOSIS — N184 Chronic kidney disease, stage 4 (severe): Secondary | ICD-10-CM | POA: Diagnosis present

## 2023-07-08 DIAGNOSIS — M1120 Other chondrocalcinosis, unspecified site: Secondary | ICD-10-CM | POA: Diagnosis present

## 2023-07-08 DIAGNOSIS — N179 Acute kidney failure, unspecified: Secondary | ICD-10-CM | POA: Diagnosis not present

## 2023-07-08 DIAGNOSIS — Z8673 Personal history of transient ischemic attack (TIA), and cerebral infarction without residual deficits: Secondary | ICD-10-CM | POA: Diagnosis not present

## 2023-07-09 DIAGNOSIS — F0781 Postconcussional syndrome: Secondary | ICD-10-CM | POA: Diagnosis not present

## 2023-07-09 DIAGNOSIS — N184 Chronic kidney disease, stage 4 (severe): Secondary | ICD-10-CM | POA: Diagnosis not present

## 2023-07-09 DIAGNOSIS — I4892 Unspecified atrial flutter: Secondary | ICD-10-CM | POA: Diagnosis not present

## 2023-07-09 DIAGNOSIS — N179 Acute kidney failure, unspecified: Secondary | ICD-10-CM | POA: Diagnosis not present

## 2023-07-09 DIAGNOSIS — E870 Hyperosmolality and hypernatremia: Secondary | ICD-10-CM | POA: Diagnosis not present

## 2023-07-09 DIAGNOSIS — D7581 Myelofibrosis: Secondary | ICD-10-CM | POA: Diagnosis not present

## 2023-07-09 DIAGNOSIS — I5020 Unspecified systolic (congestive) heart failure: Secondary | ICD-10-CM | POA: Diagnosis not present

## 2023-07-09 DIAGNOSIS — I130 Hypertensive heart and chronic kidney disease with heart failure and stage 1 through stage 4 chronic kidney disease, or unspecified chronic kidney disease: Secondary | ICD-10-CM | POA: Diagnosis not present

## 2023-07-09 DIAGNOSIS — I4891 Unspecified atrial fibrillation: Secondary | ICD-10-CM | POA: Diagnosis not present

## 2023-07-09 DIAGNOSIS — M25531 Pain in right wrist: Secondary | ICD-10-CM | POA: Diagnosis not present

## 2023-07-09 DIAGNOSIS — I3139 Other pericardial effusion (noninflammatory): Secondary | ICD-10-CM | POA: Diagnosis not present

## 2023-07-09 DIAGNOSIS — D631 Anemia in chronic kidney disease: Secondary | ICD-10-CM | POA: Diagnosis not present

## 2023-07-11 ENCOUNTER — Other Ambulatory Visit: Payer: Medicare Other

## 2023-07-11 ENCOUNTER — Ambulatory Visit: Payer: Medicare Other

## 2023-07-11 ENCOUNTER — Other Ambulatory Visit: Payer: Self-pay

## 2023-07-11 ENCOUNTER — Telehealth: Payer: Self-pay | Admitting: Cardiovascular Disease

## 2023-07-11 ENCOUNTER — Ambulatory Visit: Payer: Medicare Other | Admitting: Nurse Practitioner

## 2023-07-11 DIAGNOSIS — I129 Hypertensive chronic kidney disease with stage 1 through stage 4 chronic kidney disease, or unspecified chronic kidney disease: Secondary | ICD-10-CM

## 2023-07-11 DIAGNOSIS — D649 Anemia, unspecified: Secondary | ICD-10-CM

## 2023-07-11 DIAGNOSIS — K219 Gastro-esophageal reflux disease without esophagitis: Secondary | ICD-10-CM

## 2023-07-11 DIAGNOSIS — I6990 Unspecified sequelae of unspecified cerebrovascular disease: Secondary | ICD-10-CM

## 2023-07-11 DIAGNOSIS — I5021 Acute systolic (congestive) heart failure: Secondary | ICD-10-CM

## 2023-07-11 DIAGNOSIS — I4891 Unspecified atrial fibrillation: Secondary | ICD-10-CM

## 2023-07-11 DIAGNOSIS — N184 Chronic kidney disease, stage 4 (severe): Secondary | ICD-10-CM

## 2023-07-11 DIAGNOSIS — R410 Disorientation, unspecified: Secondary | ICD-10-CM

## 2023-07-11 NOTE — Telephone Encounter (Signed)
See previous notes:    Pt was seen by Cadence [facility] 07/04/23. I will forward the original request that the office sent to [place] today again to Cadence Furth, [facility] to ask if she will sign original form.   Again see all notes. Marland Kitchen

## 2023-07-11 NOTE — Transitions of Care (Post Inpatient/ED Visit) (Signed)
 07/11/2023  Name: [name] MRN: [id number] DOB: Oct 27, 1940  Today's TOC FU Call Status: Today's TOC FU Call Status:: Successful TOC FU Call Completed TOC FU Call Complete Date: 07/11/23 Patient's Name and Date of Birth confirmed.  Transition Care Management Follow-up Telephone Call Date of Discharge: 07/10/23 Discharge Facility: Other [facility]) Name of Other (Non[facility]) Discharge Facility: [facility] Type of Discharge: Inpatient Admission Primary Inpatient Discharge Diagnosis:: Urinary tract infection without hematuria, site unspecified   Altered mental status, unspecified altered mental status type How have you been since you were released from the hospital?: Same Any questions or concerns?: Yes Patient Questions/Concerns:: multiple  medication changes Patient Questions/Concerns Addressed: Other:, Notified Provider of Patient Questions/Concerns (Amb referral)  Items Reviewed: Did you receive and understand the discharge instructions provided?: Yes Medications obtained,verified, and reconciled?: Yes (Medications Reviewed) Any new allergies since your discharge?: No Dietary orders reviewed?: Yes Type of Diet Ordered:: Reg Heart Healthy Do you have support at home?: Yes People in Home: spouse, child(ren), adult Name of Support/Comfort Primary Source: Wife [name]  Medications Reviewed Today: Medications Reviewed Today     Reviewed by Willma Camelia CROME, RN (Registered Nurse) on 07/11/23 at 1439  Med List Status: <None>   Medication Order Taking? Sig Documenting Provider Last Dose Status Informant  acetaminophen  (TYLENOL ) 500 MG tablet [id number] Yes Take 1,000 mg by mouth every 6 (six) hours as needed for mild pain (pain score 1-3). [provider] Taking Active Self, Spouse/Significant Other  amiodarone  (PACERONE ) 200 MG tablet [id number] Yes Take 200 mg by mouth daily. [provider] Taking Active   apixaban  (ELIQUIS ) 2.5 MG TABS tablet  [id number] Yes Take 1 tablet (2.5 mg total) by mouth 2 (two) times daily. Vicci Bouchard P, DO Taking Active   azelastine  (ASTELIN ) 0.1 % nasal spray [id number] Yes Place 1 spray into both nostrils 2 (two) times daily. Use in each nostril as directed  Patient taking differently: Place 1 spray into both nostrils 2 (two) times daily as needed for rhinitis or allergies. Use in each nostril as directed   Stuart Vernell Norris, PA-C Taking Active Spouse/Significant Other           Med Note CLAUD MICHEAL ONEIDA [place] Apr 06, 2023 12:23 PM) On hand  Calcium  Citrate-Vitamin D  (CITRACAL + D PO) [id number] No Take 1 tablet by mouth in the morning.  Patient not taking: Reported on 07/11/2023   [provider] Not Taking Active Spouse/Significant Other           Med Note (Elea Holtzclaw L   Tue Jul 11, 2023  2:31 PM) Spouse does nit have this has NA Bi carb   Cholecalciferol (D3-1000 PO) [id number] Yes Take 1,000 Units by mouth in the morning. [provider] Taking Active Spouse/Significant Other  ciprofloxacin  (CIPRO ) 500 MG tablet [id number] No Take 1 tablet (500 mg total) by mouth 2 (two) times daily.  Patient not taking: Reported on 07/11/2023   Vicci Bouchard SQUIBB, DO Not Taking Active   cyclobenzaprine  (FLEXERIL ) 10 MG tablet [id number] Yes Take 1 tablet (10 mg total) by mouth at bedtime. Johnson, Megan P, DO Taking Active   doxycycline  (ADOXA) 100 MG tablet [id number] Yes Take 100 mg by mouth 2 (two) times daily. X 5 days [provider]  Active   DULoxetine  (CYMBALTA ) 20 MG capsule [id number] Yes Take 1 capsule (20 mg total) by mouth at bedtime. Johnson, Megan P, DO Taking Active   furosemide  (LASIX ) 40 MG  tablet [id number] Yes Take 1 tablet (40 mg total) by mouth daily. Vicci Bouchard P, DO Taking Active   gabapentin  (NEURONTIN ) 300 MG capsule [id number] Yes Take 1 capsule (300 mg total) by mouth 2 (two) times daily. Vicci Bouchard SQUIBB, DO Taking Active            Med Note (Jyllian Haynie L    Tue Jul 11, 2023  2:30 PM) Takes  @ bedtime only    ketoconazole  (NIZORAL ) 2 % cream [id number] Yes Apply twice daily to affected area on buttocks until clear Hester Alm BROCKS, MD Taking Active Spouse/Significant Other  levothyroxine  (SYNTHROID ) 50 MCG tablet [id number] Yes Take 1 tablet (50 mcg total) by mouth daily before breakfast. Vicci Bouchard SQUIBB, DO Taking Active   Lidocaine -Menthol (NERVIVE ROLL-ON EX) [id number] Yes Apply 1 application  topically every 4 (four) hours as needed (mild pain). [provider] Taking Active   meclizine  (ANTIVERT ) 25 MG tablet [id number] Yes TAKE 1 TABLET BY MOUTH THREE TIMES DAILYAS NEEDED FOR DIZZINESS Cannady, Jolene T, NP Taking Active Spouse/Significant Other  melatonin 3 MG TABS tablet [id number] Yes Take 1 tablet (3 mg total) by mouth at bedtime as needed (insomnia). Vicci Bouchard P, DO Taking Active   metoprolol  succinate (TOPROL -XL) 100 MG 24 hr tablet [id number] Yes Take 1 tablet (100 mg total) by mouth 2 (two) times daily. Take with or immediately following a meal. Johnson, Megan P, DO Taking Active            Med Note (Yanique Mulvihill L   Tue Jul 11, 2023  2:32 PM) Takes 1 x day   Multiple Vitamin (MULITIVITAMIN WITH MINERALS) TABS [id number] Yes Take 1 tablet by mouth in the morning. [provider] Taking Active Spouse/Significant Other  pantoprazole  (PROTONIX ) 40 MG tablet [id number] Yes Take 1 tablet (40 mg total) by mouth daily. Vicci Bouchard P, DO Taking Active   predniSONE  (DELTASONE ) 10 MG tablet [id number] Yes Take 10 mg by mouth daily with breakfast. Take 3 tablets (30 mg total) by mouth daily for 7 days, THEN 2 tablets (20 mg total) daily for 7 days, THEN 1 and 1/2 tablets (15 mg total) daily for 7 days, THEN 1 tablet (10 mg total) daily for 7 days, THEN 1/2 tablet (5 mg total) daily for 7 days. [provider] Taking Active            Med Note (Neaveh Belanger L   Tue Jul 11, 2023  2:33 PM) 1/2 tab daily vs taper   QUEtiapine   (SEROQUEL ) 25 MG tablet [id number] Yes Take 1 tablet (25 mg total) by mouth at bedtime. Vicci Bouchard P, DO Taking Active   simvastatin  (ZOCOR ) 40 MG tablet [id number] Yes TAKE ONE TABLET BY MOUTH EVERY DAY Johnson, Megan P, DO Taking Active            Med Note (Emmelyn Schmale L   Tue Jul 11, 2023  2:14 PM) Needs refill   sodium bicarbonate  650 MG tablet [id number] Yes Take 1 tablet (650 mg total) by mouth 2 (two) times daily. Laurita Pillion, MD Taking Active   tamsulosin  (FLOMAX ) 0.4 MG CAPS capsule [id number] Yes Take 2 capsules (0.8 mg total) by mouth every evening. Vicci Bouchard P, DO Taking Active   zinc  gluconate 50 MG tablet [id number] Yes Take 50 mg by mouth in the morning. [provider] Taking Active Spouse/Significant Other          Medication reconciliation / review completed based on most recent discharge  summary and EHR medication list. Confirmed patient is taking all newly prescribed medications as instructed (any discrepancies are noted in review section)  . Several long term medications were omitted  from discharge list he is continuing on these  Patient / Caregiver is aware of any changes to and / or  any dosage adjustments to medication regimen. Patient/ Caregiver denies questions at this time and reports no barriers to medication adherence.   Home Care and Equipment/Supplies: Were Home Health Services Ordered?: Yes Name of Home Health Agency:: [facility] [phone] Has Agency set up a time to come to your home?: Yes First Home Health Visit Date: 07/12/23 Any new equipment or medical supplies ordered?: No  Functional Questionnaire: Do you need assistance with bathing/showering or dressing?: Yes Do you need assistance with meal preparation?: Yes Do you need assistance with eating?: No Do you have difficulty maintaining continence: Yes Do you need assistance with getting out of bed/getting out of a chair/moving?: Yes Do you have difficulty managing or taking your  medications?: Yes (Spouse manages)  Follow up appointments reviewed: PCP Follow-up appointment confirmed?: No (Home < 24 hrs she will call and set up) MD Provider Line Number:[phone] Given: No Specialist Hospital Follow-up appointment confirmed?: Yes Date of Specialist follow-up appointment?: 08/08/23 Follow-Up Specialty Provider:: Cardiology 3/4 Nephrology and Oncology , routine labs and Infusions a needed Do you need transportation to your follow-up appointment?: Yes Transportation Need Intervention Addressed By:: AMB Referral For SDOH Needs (prevous referral for [facility] needs and spouse is able to drive but needs some assistance) Do you understand care options if your condition(s) worsen?: Yes-patient verbalized understanding  SDOH Interventions Today    Flowsheet Row Most Recent Value  SDOH Interventions   Food Insecurity Interventions Intervention Not Indicated  Housing Interventions Intervention Not Indicated  Transportation Interventions Intervention Not Indicated, Patient Resources (Friends/Family), AMB Referral  Utilities Interventions Intervention Not Indicated  Social Connections Interventions Intervention Not Indicated       Goals Addressed             This Visit's Progress    TOC Care Plan       Current Barriers:  Chronic Disease Management support and education needs related to Atrial Fibrillation   RNCM Clinical Goal(s):  Patient will take all medications exactly as prescribed and will call provider for medication related questions as evidenced by no missed medication doses  attend all scheduled medical appointments: with PCP and Cardiology  as evidenced by no missed follow-up visits- Cardiology  PCP pending  , Oncology Dr Melanee labs and  possible transfusions as needed   continue to work with RN Care Manager to address care management and care coordination needs related to  Atrial Fibrillation as evidenced by adherence to CM Team Scheduled appointments through  collaboration with RN Care manager, provider, and care team.   Interventions: Evaluation of current treatment plan related to  self management and patient's adherence to plan as established by provider  Transitions of Care:  Goal on track:  Yes. Durable Medical Equipment (DME) needs assessed with patient/caregiver, reviewed with patient/caregiver, and orders reviewed Doctor Visits  - discussed the importance of doctor visits Health /OT/PT -[place]  also seeing [facility] Post discharge activity limitations prescribed by provider reviewed  AFIB Interventions: (Status:  Goal on track:  Yes.) Short Term Goal   Reviewed importance of adherence to anticoagulant exactly as prescribed Advised patient to discuss Medications  with provider Counseled on seeking medical attention after a head injury or if  there is blood in the urine/stool Screening for signs and symptoms of depression related to chronic disease state  Assessed social determinant of health barriers  Patient Goals/Self-Care Activities: Participate in Transition of Care Program/Attend TOC scheduled calls Take all medications as prescribed Attend all scheduled provider appointments Call pharmacy for medication refills 3-7 days in advance of running out of medications Attend church or other social activities Perform all self care activities independently  Perform IADL's (shopping, preparing meals, housekeeping, managing finances) independently Call provider office for new concerns or questions   Follow Up Plan:  Telephone follow up appointment with care management team member scheduled for:  07/19/23 @ 11:00am The patient has been provided with contact information for the care management team and has been advised to call with any health related questions or concerns.         Patient is at high risk for readmission and/or has history of  high utilization  Discussed VBCI  TOC program and weekly calls to patient to assess  condition/status, medication management  and provide support/education as indicated . Patient/ Caregiver voiced understanding and is  agreeable to 30 day program  . He has almost completed the initial 30 day TOC Program and was unfortunately readmitted due to a UTI   Routine follow-up and on-going assessment evaluation and education of disease processes, recommended interventions for both chronic and acute medical conditions , will occur during each weekly visit along with ongoing review of symptoms ,medication reviews and reconciliation. Any updates , inconsistencies, discrepancies or acute care concerns will be addressed and routed to the correct Practitioner if indicated   Based on current information and Insurance plan -Reviewed benefits available to patient, including details about eligibility options for care if any area of needs were identified.  Reviewed patients ability to access and / or navigating the benefits system..Amb Referral made  previously- call w/ SW scheduled for 2/5 Added Pharmacy referral for Polypharmacy , refer to orders section of note for details   Please refer to Care Plan for goals and interventions -Effectiveness of interventions, symptom management and outcomes will be evaluated  weekly during [facility] 30-day Program Outreach calls  . Any necessary  changes and updates to Care Plan will be completed episodically    Reviewed goals for care Patient verbalizes understanding of instructions and care plan provided. Patient was encouraged to make informed decisions about their care, actively participate in managing their health condition, and implement lifestyle changes as needed to promote independence and self-management of health care   Patient was encouraged to Contact PCP with any changes in baseline or  medication regimen,  changes in health status  /  well-being, safety concerns, including falls any questions or concerns regarding ongoing medical care, any difficulty obtaining  or picking up prescriptions, any changes or worsening in condition- including  symptoms not relieved  with interventions    The patient has been provided with contact information for the care management team and has been advised to call with any health-related questions or concerns. Follow up as indicated with Care Team , or sooner should any new problems arise.   Bari Mayans , BSN, RN [facility]   VBCI-Population Health RN Care Manager Direct Dial [phone]  Fax: [phone] Website: [email or web address]

## 2023-07-11 NOTE — Telephone Encounter (Signed)
[name] with [facility] called in stating they received notes but their surgeon requires their original document sent over to be filled out and signed by Dr. Mariah Milling. She states she will send another copy just in case to main fax: [phone].

## 2023-07-11 NOTE — Telephone Encounter (Signed)
Original form placed in Cadence Chelsea PA box.

## 2023-07-11 NOTE — Telephone Encounter (Signed)
 Left message for [name], per our preop protocol the information from the requesting office is implemented into a format for preop for our team.   ALL information needed for the clearance and on the clearance form is noted into our format for the cardiologist and preop APP.  We do not keep the original forms.   If surgeon needs the original to be signed they will need to reach out to Dr. Gollan in our [place] location.

## 2023-07-11 NOTE — Telephone Encounter (Signed)
error 

## 2023-07-12 ENCOUNTER — Other Ambulatory Visit: Payer: Self-pay

## 2023-07-12 ENCOUNTER — Telehealth: Payer: Self-pay

## 2023-07-12 ENCOUNTER — Ambulatory Visit: Payer: Medicare Other | Attending: Medical

## 2023-07-12 ENCOUNTER — Ambulatory Visit: Payer: Self-pay | Admitting: *Deleted

## 2023-07-12 DIAGNOSIS — N184 Chronic kidney disease, stage 4 (severe): Secondary | ICD-10-CM | POA: Diagnosis not present

## 2023-07-12 DIAGNOSIS — D649 Anemia, unspecified: Secondary | ICD-10-CM

## 2023-07-12 DIAGNOSIS — N3000 Acute cystitis without hematuria: Secondary | ICD-10-CM | POA: Diagnosis not present

## 2023-07-12 DIAGNOSIS — I5020 Unspecified systolic (congestive) heart failure: Secondary | ICD-10-CM | POA: Diagnosis not present

## 2023-07-12 DIAGNOSIS — I3139 Other pericardial effusion (noninflammatory): Secondary | ICD-10-CM | POA: Diagnosis not present

## 2023-07-12 DIAGNOSIS — I130 Hypertensive heart and chronic kidney disease with heart failure and stage 1 through stage 4 chronic kidney disease, or unspecified chronic kidney disease: Secondary | ICD-10-CM | POA: Diagnosis not present

## 2023-07-12 DIAGNOSIS — I4891 Unspecified atrial fibrillation: Secondary | ICD-10-CM | POA: Diagnosis not present

## 2023-07-12 DIAGNOSIS — N281 Cyst of kidney, acquired: Secondary | ICD-10-CM | POA: Diagnosis not present

## 2023-07-12 DIAGNOSIS — D631 Anemia in chronic kidney disease: Secondary | ICD-10-CM | POA: Diagnosis not present

## 2023-07-12 DIAGNOSIS — N041 Nephrotic syndrome with focal and segmental glomerular lesions: Secondary | ICD-10-CM | POA: Diagnosis not present

## 2023-07-12 NOTE — Progress Notes (Signed)
 Care Guide Pharmacy Note  07/12/2023 Name: [name] MRN: [id number] DOB: [birth year]  Referred By: Vicci Duwaine SQUIBB, DO Reason for referral: Care Coordination (Outreach to schedule with pharm d )   [name] is a 83 y.o. year old male who is a primary care patient of Vicci Duwaine SQUIBB, DO.  [name] was referred to the pharmacist for assistance related to: Atrial Fibrillation  An unsuccessful telephone outreach was attempted today to contact the patient who was referred to the pharmacy team for assistance with medication assistance. Additional attempts will be made to contact the patient.  [name] , RMA     [facility]  [facility] Guide  Direct Dial: [phone]  Website: [email or web address]

## 2023-07-12 NOTE — Telephone Encounter (Signed)
 Forms given to PA for review

## 2023-07-13 ENCOUNTER — Inpatient Hospital Stay: Payer: Medicare Other

## 2023-07-13 ENCOUNTER — Inpatient Hospital Stay: Payer: Medicare Other | Attending: Oncology

## 2023-07-13 ENCOUNTER — Ambulatory Visit: Payer: Medicare Other | Admitting: Family Medicine

## 2023-07-13 ENCOUNTER — Encounter: Payer: Self-pay | Admitting: Nurse Practitioner

## 2023-07-13 ENCOUNTER — Inpatient Hospital Stay (HOSPITAL_BASED_OUTPATIENT_CLINIC_OR_DEPARTMENT_OTHER): Payer: Medicare Other | Admitting: Nurse Practitioner

## 2023-07-13 VITALS — BP 127/72 | HR 75 | Temp 97.6°F | Resp 19 | Wt 167.5 lb

## 2023-07-13 DIAGNOSIS — I129 Hypertensive chronic kidney disease with stage 1 through stage 4 chronic kidney disease, or unspecified chronic kidney disease: Secondary | ICD-10-CM | POA: Insufficient documentation

## 2023-07-13 DIAGNOSIS — Z79899 Other long term (current) drug therapy: Secondary | ICD-10-CM

## 2023-07-13 DIAGNOSIS — N184 Chronic kidney disease, stage 4 (severe): Secondary | ICD-10-CM | POA: Insufficient documentation

## 2023-07-13 DIAGNOSIS — D631 Anemia in chronic kidney disease: Secondary | ICD-10-CM | POA: Diagnosis not present

## 2023-07-13 DIAGNOSIS — D469 Myelodysplastic syndrome, unspecified: Secondary | ICD-10-CM

## 2023-07-13 DIAGNOSIS — D649 Anemia, unspecified: Secondary | ICD-10-CM

## 2023-07-13 DIAGNOSIS — N183 Anemia in chronic kidney disease: Secondary | ICD-10-CM

## 2023-07-13 DIAGNOSIS — D7581 Myelofibrosis: Secondary | ICD-10-CM

## 2023-07-13 LAB — SAMPLE TO BLOOD BANK

## 2023-07-13 LAB — HEMOGLOBIN AND HEMATOCRIT (CANCER CENTER ONLY)
HCT: 28.1 % — ABNORMAL LOW (ref 39.0–52.0)
Hemoglobin: 8.7 g/dL — ABNORMAL LOW (ref 13.0–17.0)

## 2023-07-13 MED ORDER — EPOETIN ALFA-EPBX 10000 UNIT/ML IJ SOLN: 10000.0000 [IU] | Freq: Once | INTRAMUSCULAR | Status: DC

## 2023-07-13 MED ORDER — EPOETIN ALFA-EPBX 40000 UNIT/ML IJ SOLN: 40000.0000 [IU] | Freq: Once | INTRAMUSCULAR | Status: DC

## 2023-07-13 MED ORDER — EPOETIN ALFA-EPBX 10000 UNIT/ML IJ SOLN
10000.0000 [IU] | Freq: Once | INTRAMUSCULAR | Status: AC
  Administered 2023-07-13: 10000 [IU] via SUBCUTANEOUS
  Filled 2023-07-13: qty 1

## 2023-07-13 MED ORDER — EPOETIN ALFA-EPBX 40000 UNIT/ML IJ SOLN
40000.0000 [IU] | Freq: Once | INTRAMUSCULAR | Status: AC
  Administered 2023-07-13: 40000 [IU] via SUBCUTANEOUS
  Filled 2023-07-13: qty 1

## 2023-07-13 NOTE — Patient Instructions (Signed)
 Visit Information  Thank you for taking time to visit with me today. Please don't hesitate to contact me if I can be of assistance to you.   Following are the goals we discussed today:   Goals Addressed             This Visit's Progress    Community resources for caregiver support       Activities and task to complete in order to accomplish goals.   Keep all upcoming appointment discussed today Self Support options  (continue to reach out to family and friends for caregiver assistance and support) REFERRAL PLACED BY SOCIAL WORK  I have placed a referral with [facility] for respite care(you have been placed on the waiting list) they will contact you once a space becomes available.           Our next appointment is by telephone on 07/28/23 at 11am  Please call the care guide team at [phone] if you need to cancel or reschedule your appointment.   If you are experiencing a Mental Health or Behavioral Health Crisis or need someone to talk to, please call 911   Patient verbalizes understanding of instructions and care plan provided today and agrees to view in MyChart. Active MyChart status and patient understanding of how to access instructions and care plan via MyChart confirmed with patient.     Telephone follow up appointment with care management team member scheduled for: 07/28/23    Lenn Mean, LCSW [place]  [facility], [facility] Licensed Clinical Social Worker Care Coordinator  Direct Dial: [phone]

## 2023-07-13 NOTE — Addendum Note (Signed)
 Addended by: Vivi Grit on: 07/13/2023 03:22 PM   Modules accepted: Orders

## 2023-07-13 NOTE — Addendum Note (Signed)
 Addended by: Vivi Grit on: 07/13/2023 03:25 PM   Modules accepted: Orders

## 2023-07-13 NOTE — Patient Outreach (Addendum)
  Care Coordination   Initial Visit Note   07/13/2023 Name: [name] MRN: [id number] DOB: November 17, 1940  [name] is a 83 y.o. year old male who sees Vicci Duwaine SQUIBB, DO for primary care. I spoke with  Marinda A Charter's spouse Marquie Aderhold by phone on 07/12/23.  What matters to the patients health and wellness today?  Caregiver support resources    Goals Addressed             This Visit's Progress    Community resources for caregiver support       Activities and task to complete in order to accomplish goals.   Keep all upcoming appointment discussed today Self Support options  (continue to reach out to family and friends for caregiver assistance and support) REFERRAL PLACED BY SOCIAL WORK  I have placed a referral with [facility] for respite care(you have been placed on the waiting list) they will contact you once a space becomes available.           SDOH assessments and interventions completed:  Yes  SDOH Interventions Today    Flowsheet Row Most Recent Value  SDOH Interventions   Food Insecurity Interventions Intervention Not Indicated  Housing Interventions Intervention Not Indicated  Transportation Interventions Intervention Not Indicated, Patient Resources (Friends/Family), AMB Referral  Utilities Interventions Intervention Not Indicated        Care Coordination Interventions:  Yes, provided  Interventions Today    Flowsheet Row Most Recent Value  Chronic Disease   Chronic disease during today's visit Congestive Heart Failure (CHF)  [caregiver stress]  General Interventions   General Interventions Discussed/Reviewed General Interventions Discussed, Programmer, Applications, Special Educational Needs Teacher with  Research Scientist (medical) of community resource needs and in home care needs-patient's spouse  confirms having a family member that lives next door that assists at times with pts care as well as his son]  Communication with --  [facility]-respite care program]  Education  Interventions   Education Provided Provided Education  Provided Verbal Education On [employer] importance of self care-referral for respite care recommended]  Mental Health Interventions   Mental Health Discussed/Reviewed Mental Health Discussed, Coping Strategies  [caregiver stress acknowledged-discussed importance of self care-explored additional support options including respite and private duty care-]       Follow up plan: Follow up call scheduled for 07/28/23    Encounter Outcome:  Patient Visit Completed

## 2023-07-13 NOTE — Progress Notes (Signed)
 Hematology/Oncology Consult Note [facility]  [phone] [phone]  Patient Care Team: Vicci Duwaine SQUIBB, DO as PCP - General (Family Medicine) Perla Evalene PARAS, MD as PCP - Cardiology (Cardiology) Etha Quinten Ludwig, MD as Referring Physician (Hematology) Alvia Norleen BIRCH, MD as Consulting Physician (Ophthalmology) Rosemarie Eather RAMAN, MD as Consulting Physician (Neurology) Nieves Cough, MD as Consulting Physician (Urology) Perla Evalene PARAS, MD as Consulting Physician (Cardiology) Twylla Glendia BROCKS, MD (Urology) Melanee Annah BROCKS, MD as Consulting Physician (Hematology and Oncology) Melanee Annah BROCKS, MD as Consulting Physician (Oncology) Ermalinda Lenn HERO, [place] as [facility] Care Management   Name of the patient: [name]  [id number]  06/09/1940   Date of visit: 07/13/23  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  At that time he was found to have a mild splenomegaly of 15.8 cm.DIPPS score is 82 (age 38- 1, hemoglobin less than 10- 2) and score of 4 if 1% circulating blasts included from 07/17/2014.   Bone marrow on 06/11/2014 was most consistent with primary myelofibrosis.  Bone marrow biopsy showed 1% abnormal cells: CD45+, CD5+, CD10, CD11c+/-, CD19+, CD2-+, (dim), CD22+ (dim, CD23+, CD38-/+, FMC7-, HLA-DR+, sig lambda+(dim).  Blasts were not increased 1.2%; hypercellular for age: 37%; JAK2 V617F mutation was negative.  CALR mutation positive.  Flow cytometry included about 1% CLL/SLL phenotype cells (CD5+) of uncertain significance and some infiltrate into the marrow with increased atypical megakaryocytes.  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  MDS FISH panel was negative.   Patient also follows up with [facility] benign hematology  Dr. Margart Ruddy for his anemia.  Patient's hemoglobin was drifting down to the eights and was started on EPO in October 2022.    Interval history- Patient is 83 y.o. male with above history of primary myelofibrosis, predominant anemia, who returns to clinic for follow up and consideration of retacrit . He has had interval hospitalization due to metabolic encephalopathy in setting of acute cystitis. He's now been discharged and is back home. He has ongoing weakness. Has not been able to work which he finds frustrating. He is getting stronger. Denies shortness of breath.   ECOG PS- 2-3 Pain scale- 0  Review of systems- Review of Systems  Constitutional:  Positive for malaise/fatigue and weight loss. Negative for chills and fever.  HENT:  Negative for congestion, ear discharge and nosebleeds.   Eyes:  Negative for blurred vision.  Respiratory:  Negative for cough, hemoptysis, sputum production, shortness of breath and wheezing.   Cardiovascular:  Negative for chest pain, palpitations, orthopnea and claudication.  Gastrointestinal:  Negative for abdominal pain, blood in stool, constipation, diarrhea, heartburn, melena, nausea and vomiting.  Genitourinary:  Negative for dysuria, flank pain, frequency, hematuria and urgency.  Musculoskeletal:  Negative for back pain, falls, joint pain and myalgias.  Skin:  Negative for rash.  Neurological:  Positive for weakness. Negative for dizziness, tingling, focal weakness, seizures and headaches.  Endo/Heme/Allergies:  Does not bruise/bleed easily.  Psychiatric/Behavioral:  Negative for depression and suicidal ideas. The patient is not nervous/anxious and does not have insomnia.     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   right forearm, [facility]   Stroke (HCC) 11/2007   R brain subcortical infarct   Past Surgical History:  Procedure Laterality Date   ASPIRATION / INJECTION RENAL CYST  07/08/2015   BACK SURGERY     approx 20- 25 years ago   COLONOSCOPY     EYE SURGERY     cataract both eyes   GAS INSERTION  08/11/2011   Procedure: INSERTION OF GAS;  Surgeon: Norleen JONETTA Ku, MD;  Location: [facility] OR;  Service: Ophthalmology;  Laterality: Right;  [id number]   LEFT HEART CATH AND CORONARY ANGIOGRAPHY N/A 05/02/2023   Procedure: LEFT HEART CATH AND CORONARY ANGIOGRAPHY;  Surgeon: Mady Bruckner, MD;  Location: [facility] INVASIVE CV LAB;  Service: Cardiovascular;  Laterality: N/A;   REVERSE SHOULDER ARTHROPLASTY Right 04/13/2023   Procedure: REVERSE SHOULDER ARTHROPLASTY;  Surgeon: Edie Norleen PARAS, MD;  Location: [facility] ORS;  Service: Orthopedics;  Laterality: Right;   SCLERAL BUCKLE  08/11/2011   Procedure: SCLERAL BUCKLE;  Surgeon: Norleen JONETTA Ku, MD;  Location: [facility] OR;  Service: Ophthalmology;  Laterality: Right;   VARICOSE VEIN SURGERY     Social History   Socioeconomic History   Marital status: Married    Spouse name: Maceo    Number of children: 2   Years of education: 12+   Highest education level: Some college, no degree  Occupational History   Occupation: Fish Farm Manager: OTHER    Comment: community   Occupation: SELF EMPLOYED    Employer: SELF EMPLOYED  Tobacco Use   Smoking status: Former    Current packs/day: 0.00    Types: Cigarettes    Quit date: 06/06/2010    Years since quitting: 13.1   Smokeless tobacco: Never  Vaping Use   Vaping status: Never  Used  Substance and Sexual Activity   Alcohol use: No    Alcohol/week: 0.0 standard drinks of alcohol   Drug use: No   Sexual activity: Not Currently  Other Topics Concern   Not on file  Social History Narrative   Pt lives at home with his family.   Caffeine Use- 2 cups daily   Patient has 2 children.    Patient has some college.    Patient is right handed.          Works full time   Social Drivers of Corporate Investment Banker Strain: Low Risk  (07/12/2021)   Overall Financial Resource Strain (CARDIA)    Difficulty of Paying Living Expenses: Not hard at all  Food Insecurity: No Food Insecurity (07/12/2023)   Hunger Vital Sign    Worried About Running Out of Food in the Last Year: Never true    Ran Out of Food in the Last Year: Never true  Transportation Needs: No Transportation Needs (07/12/2023)   PRAPARE - Administrator, Civil Service (Medical): No    Lack of Transportation (Non-Medical): No  Physical Activity: Inactive (07/12/2021)   Exercise Vital Sign    Days  of Exercise per Week: 0 days    Minutes of Exercise per Session: 0 min  Stress: No Stress Concern Present (07/12/2021)   [employer] of Occupational Health - Occupational Stress Questionnaire    Feeling of Stress : Not at all  Social Connections: Socially Integrated (07/11/2023)   Social Connection and Isolation Panel [NHANES]    Frequency of Communication with Friends and Family: More than three times a week    Frequency of Social Gatherings with Friends and Family: More than three times a week    Attends Religious Services: More than 4 times per year    Active Member of [employer] or Organizations: Yes    Attends Engineer, Structural: More than 4 times per year    Marital Status: Married  Catering Manager Violence: Not At Risk (07/12/2023)   Humiliation, Afraid, Rape, and Kick questionnaire    Fear of Current or Ex-Partner: No    Emotionally Abused: No    Physically Abused: No    Sexually  Abused: No   Family History  Problem Relation Age of Onset   Heart disease Father    Dementia Sister    Diabetes Son    Kidney disease Neg Hx    Prostate cancer Neg Hx    Current Outpatient Medications:    acetaminophen  (TYLENOL ) 500 MG tablet, Take 1,000 mg by mouth every 6 (six) hours as needed for mild pain (pain score 1-3)., Disp: , Rfl:    amiodarone  (PACERONE ) 200 MG tablet, Take 200 mg by mouth daily., Disp: , Rfl:    apixaban  (ELIQUIS ) 2.5 MG TABS tablet, Take 1 tablet (2.5 mg total) by mouth 2 (two) times daily., Disp: 180 tablet, Rfl: 3   azelastine  (ASTELIN ) 0.1 % nasal spray, Place 1 spray into both nostrils 2 (two) times daily. Use in each nostril as directed (Patient taking differently: Place 1 spray into both nostrils 2 (two) times daily as needed for rhinitis or allergies. Use in each nostril as directed), Disp: 30 mL, Rfl: 0   Cholecalciferol (D3-1000 PO), Take 1,000 Units by mouth in the morning., Disp: , Rfl:    cyclobenzaprine  (FLEXERIL ) 10 MG tablet, Take 1 tablet (10 mg total) by mouth at bedtime., Disp: 30 tablet, Rfl: 0   doxycycline  (ADOXA) 100 MG tablet, Take 100 mg by mouth 2 (two) times daily. X 5 days, Disp: , Rfl:    DULoxetine  (CYMBALTA ) 20 MG capsule, Take 1 capsule (20 mg total) by mouth at bedtime., Disp: 90 capsule, Rfl: 1   furosemide  (LASIX ) 40 MG tablet, Take 1 tablet (40 mg total) by mouth daily., Disp: 90 tablet, Rfl: 1   gabapentin  (NEURONTIN ) 300 MG capsule, Take 1 capsule (300 mg total) by mouth 2 (two) times daily., Disp: 180 capsule, Rfl: 1   ketoconazole  (NIZORAL ) 2 % cream, Apply twice daily to affected area on buttocks until clear, Disp: 60 g, Rfl: 2   levothyroxine  (SYNTHROID ) 50 MCG tablet, Take 1 tablet (50 mcg total) by mouth daily before breakfast., Disp: 90 tablet, Rfl: 0   Lidocaine -Menthol (NERVIVE ROLL-ON EX), Apply 1 application  topically every 4 (four) hours as needed (mild pain)., Disp: , Rfl:    meclizine  (ANTIVERT ) 25 MG tablet,  TAKE 1 TABLET BY MOUTH THREE TIMES DAILYAS NEEDED FOR DIZZINESS, Disp: 90 tablet, Rfl: 6   melatonin 3 MG TABS tablet, Take 1 tablet (3 mg total) by mouth at bedtime as needed (insomnia)., Disp: 100 tablet, Rfl: 3   metoprolol  succinate (TOPROL -XL) 100 MG  24 hr tablet, Take 1 tablet (100 mg total) by mouth 2 (two) times daily. Take with or immediately following a meal., Disp: 180 tablet, Rfl: 1   Multiple Vitamin (MULITIVITAMIN WITH MINERALS) TABS, Take 1 tablet by mouth in the morning., Disp: , Rfl:    pantoprazole  (PROTONIX ) 40 MG tablet, Take 1 tablet (40 mg total) by mouth daily., Disp: 90 tablet, Rfl: 1   predniSONE  (DELTASONE ) 10 MG tablet, Take 10 mg by mouth daily with breakfast. Take 3 tablets (30 mg total) by mouth daily for 7 days, THEN 2 tablets (20 mg total) daily for 7 days, THEN 1 and 1/2 tablets (15 mg total) daily for 7 days, THEN 1 tablet (10 mg total) daily for 7 days, THEN 1/2 tablet (5 mg total) daily for 7 days., Disp: , Rfl:    QUEtiapine  (SEROQUEL ) 25 MG tablet, Take 1 tablet (25 mg total) by mouth at bedtime., Disp: 30 tablet, Rfl: 0   simvastatin  (ZOCOR ) 40 MG tablet, TAKE ONE TABLET BY MOUTH EVERY DAY, Disp: 90 tablet, Rfl: 3   sodium bicarbonate  650 MG tablet, Take 1 tablet (650 mg total) by mouth 2 (two) times daily., Disp: , Rfl:    tamsulosin  (FLOMAX ) 0.4 MG CAPS capsule, Take 2 capsules (0.8 mg total) by mouth every evening., Disp: 180 capsule, Rfl: 1   zinc  gluconate 50 MG tablet, Take 50 mg by mouth in the morning., Disp: , Rfl:    Calcium  Citrate-Vitamin D  (CITRACAL + D PO), Take 1 tablet by mouth in the morning. (Patient not taking: Reported on 07/11/2023), Disp: , Rfl:    ciprofloxacin  (CIPRO ) 500 MG tablet, Take 1 tablet (500 mg total) by mouth 2 (two) times daily. (Patient not taking: Reported on 07/13/2023), Disp: 42 tablet, Rfl: 0  Current Facility-Administered Medications:    epoetin  alfa-epbx (RETACRIT ) injection 10,000 Units, 10,000 Units, Subcutaneous, Once,  Melanee Annah BROCKS, MD   epoetin  alfa-epbx (RETACRIT ) injection 40,000 Units, 40,000 Units, Subcutaneous, Once, Melanee Annah BROCKS, MD  Facility-Administered Medications Ordered in Other Visits:    epoetin  alfa-epbx (RETACRIT ) injection 10,000 Units, 10,000 Units, Subcutaneous, Once, Melanee Annah BROCKS, MD   epoetin  alfa-epbx (RETACRIT ) injection 40,000 Units, 40,000 Units, Subcutaneous, Once, Melanee Annah BROCKS, MD  Physical exam:  Vitals:   07/13/23 1015  BP: 127/72  Pulse: 75  Resp: 19  Temp: 97.6 F (36.4 C)  SpO2: 99%  Weight: 167 lb 8 oz (76 kg)   Physical Exam Vitals reviewed.  Constitutional:      Comments: Frail appearing. Using walker. Unaccompanied.   HENT:     Head:     Comments: Diminished hearing Cardiovascular:     Rate and Rhythm: Normal rate and regular rhythm.     Heart sounds: Normal heart sounds.  Pulmonary:     Effort: Pulmonary effort is normal. No respiratory distress.     Comments: Diminished bilaterally Abdominal:     General: There is no distension.  Musculoskeletal:     Comments: walker  Skin:    General: Skin is warm and dry.  Neurological:     Mental Status: He is alert and oriented to person, place, and time.  Psychiatric:        Mood and Affect: Mood normal.        Behavior: Behavior normal.     Comments: Responses appear slowed compared to previous exam        Latest Ref Rng & Units 07/04/2023    3:08 PM  CMP  Glucose 70 -  99 mg/dL 874   BUN 8 - 27 mg/dL 55   Creatinine 9.23 - 1.27 mg/dL 6.78   Sodium 865 - 855 mmol/L 144   Potassium 3.5 - 5.2 mmol/L 4.6   Chloride 96 - 106 mmol/L 105   CO2 20 - 29 mmol/L 20   Calcium  8.6 - 10.2 mg/dL 7.4       Latest Ref Rng & Units 07/13/2023    9:50 AM  CBC  Hemoglobin 13.0 - 17.0 g/dL 8.7   Hematocrit 60.9 - 52.0 % 28.1    Iron/TIBC/Ferritin/ %Sat    Component Value Date/Time   IRON 51 01/25/2023 0943   IRON 61 (L) 05/14/2014 1417   TIBC 217 (L) 01/25/2023 0943   TIBC 246 (L) 05/14/2014 1417    FERRITIN 241 01/25/2023 0943   FERRITIN 514 (H) 05/28/2014 0945   IRONPCTSAT 24 01/25/2023 0943   IRONPCTSAT 25 05/14/2014 1417    Assessment and plan- Patient is a 83 y.o. male who returns to clinic for follow up:   Primary myelofibrosis - predominantly anemia - currently on retacrit  50,000 units weekly - last received retacrit  07/05/23 outpatient (see below) but has received intermittently inpatient as well as transfusions. - Hemoglobin previously 6.2. Today 8.7. Improved.  - iron stores well repleted per December labs - cancel transfusion for tomorrow - retacrit  today - Plan to resume weekly labs +/- retacrit  2. Splenomegaly  - 19 cm on MRI from 02/02/23 (performed for f/u of liver lesion- benign) - Plan to repeat ultrasound to follow up in March 2025 - If continued increase in spleen size, consider switching to jakafi at that time 3. Liver lesion  - incidental on renal ultrasound July 2024  - Appears to be benign hemangioma measuring 2.7 x 2.3 cm.   - no follow up required.  4. Stage IV CKD  - managed by cardiology  - recommended he follow up with nephrology  5. Pericardititis in setting of acute on chronic systolic CHF  - multiple interval hospitalizations at [facility] and OSH  - now s/p rehab admission.   - Clinically improving 6. A fib w/ RVR  - during hospitalization  - on eliquis  & metoprolol   - regular rhythm today 7. Encephalopathy  - thought to be secondary to acute cystitis w/ hematuria  - s/p hospitalization & antibiotics  - now back at home.   Disposition - retacrit  today - 1 week- lab (H&H, hold tube), +/- retacrit  - D2 possible transfusion - 2 weeks for lab (cbc, hold tube), see Dr Melanee, possible retacrit   - D2 possible transfusion- la   Visit Diagnosis 1. Myelofibrosis (HCC)   2. Erythropoietin  (EPO) stimulating agent anemia management patient   3. Symptomatic anemia    Tinnie Dawn, DNP, AGNP-C, [facility] at [facility] [phone]  (clinic) 07/13/2023

## 2023-07-14 ENCOUNTER — Inpatient Hospital Stay: Payer: Medicare Other

## 2023-07-14 DIAGNOSIS — Z96611 Presence of right artificial shoulder joint: Secondary | ICD-10-CM | POA: Diagnosis not present

## 2023-07-14 DIAGNOSIS — S46011D Strain of muscle(s) and tendon(s) of the rotator cuff of right shoulder, subsequent encounter: Secondary | ICD-10-CM | POA: Diagnosis not present

## 2023-07-14 DIAGNOSIS — G5601 Carpal tunnel syndrome, right upper limb: Secondary | ICD-10-CM | POA: Diagnosis not present

## 2023-07-14 NOTE — Telephone Encounter (Signed)
 Clearance form completed by Cadence and faxed to [facility].

## 2023-07-15 DIAGNOSIS — D631 Anemia in chronic kidney disease: Secondary | ICD-10-CM | POA: Diagnosis not present

## 2023-07-15 DIAGNOSIS — I5020 Unspecified systolic (congestive) heart failure: Secondary | ICD-10-CM | POA: Diagnosis not present

## 2023-07-15 DIAGNOSIS — N184 Chronic kidney disease, stage 4 (severe): Secondary | ICD-10-CM | POA: Diagnosis not present

## 2023-07-15 DIAGNOSIS — I130 Hypertensive heart and chronic kidney disease with heart failure and stage 1 through stage 4 chronic kidney disease, or unspecified chronic kidney disease: Secondary | ICD-10-CM | POA: Diagnosis not present

## 2023-07-15 DIAGNOSIS — I4891 Unspecified atrial fibrillation: Secondary | ICD-10-CM | POA: Diagnosis not present

## 2023-07-15 DIAGNOSIS — I3139 Other pericardial effusion (noninflammatory): Secondary | ICD-10-CM | POA: Diagnosis not present

## 2023-07-16 ENCOUNTER — Encounter: Payer: Self-pay | Admitting: Family Medicine

## 2023-07-16 NOTE — Assessment & Plan Note (Signed)
 Will get him started on seroquel  after ER visit and recheck after ER visit. Call with any concerns.

## 2023-07-17 DIAGNOSIS — M5412 Radiculopathy, cervical region: Secondary | ICD-10-CM | POA: Diagnosis not present

## 2023-07-17 DIAGNOSIS — M9901 Segmental and somatic dysfunction of cervical region: Secondary | ICD-10-CM | POA: Diagnosis not present

## 2023-07-17 DIAGNOSIS — M9903 Segmental and somatic dysfunction of lumbar region: Secondary | ICD-10-CM | POA: Diagnosis not present

## 2023-07-17 DIAGNOSIS — M5033 Other cervical disc degeneration, cervicothoracic region: Secondary | ICD-10-CM | POA: Diagnosis not present

## 2023-07-19 ENCOUNTER — Inpatient Hospital Stay: Payer: Medicare Other

## 2023-07-19 ENCOUNTER — Ambulatory Visit: Payer: Medicare Other | Admitting: Family Medicine

## 2023-07-19 ENCOUNTER — Other Ambulatory Visit: Payer: Self-pay

## 2023-07-19 ENCOUNTER — Telehealth: Payer: Self-pay

## 2023-07-19 VITALS — BP 126/63

## 2023-07-19 DIAGNOSIS — I130 Hypertensive heart and chronic kidney disease with heart failure and stage 1 through stage 4 chronic kidney disease, or unspecified chronic kidney disease: Secondary | ICD-10-CM | POA: Diagnosis not present

## 2023-07-19 DIAGNOSIS — I4891 Unspecified atrial fibrillation: Secondary | ICD-10-CM | POA: Diagnosis not present

## 2023-07-19 DIAGNOSIS — D631 Anemia in chronic kidney disease: Secondary | ICD-10-CM | POA: Diagnosis not present

## 2023-07-19 DIAGNOSIS — I3139 Other pericardial effusion (noninflammatory): Secondary | ICD-10-CM | POA: Diagnosis not present

## 2023-07-19 DIAGNOSIS — D649 Anemia, unspecified: Secondary | ICD-10-CM

## 2023-07-19 DIAGNOSIS — I5020 Unspecified systolic (congestive) heart failure: Secondary | ICD-10-CM | POA: Diagnosis not present

## 2023-07-19 DIAGNOSIS — N184 Chronic kidney disease, stage 4 (severe): Secondary | ICD-10-CM | POA: Diagnosis not present

## 2023-07-19 DIAGNOSIS — D7581 Myelofibrosis: Secondary | ICD-10-CM

## 2023-07-19 DIAGNOSIS — I129 Hypertensive chronic kidney disease with stage 1 through stage 4 chronic kidney disease, or unspecified chronic kidney disease: Secondary | ICD-10-CM | POA: Diagnosis not present

## 2023-07-19 LAB — HEMOGLOBIN AND HEMATOCRIT (CANCER CENTER ONLY)
HCT: 25.6 % — ABNORMAL LOW (ref 39.0–52.0)
Hemoglobin: 7.9 g/dL — ABNORMAL LOW (ref 13.0–17.0)

## 2023-07-19 MED ORDER — EPOETIN ALFA-EPBX 10000 UNIT/ML IJ SOLN
10000.0000 [IU] | Freq: Once | INTRAMUSCULAR | Status: AC
  Administered 2023-07-19: 10000 [IU] via SUBCUTANEOUS
  Filled 2023-07-19: qty 1

## 2023-07-19 MED ORDER — EPOETIN ALFA-EPBX 40000 UNIT/ML IJ SOLN
40000.0000 [IU] | Freq: Once | INTRAMUSCULAR | Status: AC
  Administered 2023-07-19: 40000 [IU] via SUBCUTANEOUS
  Filled 2023-07-19: qty 1

## 2023-07-19 NOTE — Progress Notes (Unsigned)
Care Guide Pharmacy Note  07/19/2023 Name: [name] MRN: [id number] DOB: 04-Jul-1940  Referred By: [name], DO Reason for referral: Care Coordination (Outreach to schedule with pharm d )   [name] is a 83 y.o. year old male who is a primary care patient of [name], DO.  [name] was referred to the pharmacist for assistance related to: Atrial Fibrillation  A second unsuccessful telephone outreach was attempted today to contact the patient who was referred to the pharmacy team for assistance with medication management. Additional attempts will be made to contact the patient.  [name] , RMA     [facility]  [facility], [facility] Guide  Direct Dial: [phone]  Website: [name]

## 2023-07-19 NOTE — Progress Notes (Signed)
Patient's hgb was at 7.9 today, so I went ahead and sent a secure chat to Dr. Smith Robert.

## 2023-07-19 NOTE — Patient Outreach (Signed)
Care Management  Transitions of Care Program Transitions of Care Post-discharge week 2   07/19/2023 Name: [name] MRN: [id number] DOB: [birth year]  Subjective: [name] is a 83 y.o. year old male who is a primary care patient of [name], DO. The Care Management team Engaged with patient Engaged with patient by telephone to assess and address transitions of care needs.   Consent to Services:  Patient was given information about care management services, agreed to services, and gave verbal consent to participate.   Assessment:     Patient/ Caregiver  voices no new complaints or concerns  and has not developed/ reported any new medical issues / Dx or acute changes. - since last follow-up call for most recent  Hospital stay  1/31-2/3  / 2025 Per Spouse  he is doing better, he is still followed by [facility] for PT/OT,She had a call with the SW and is on the wait list for Respite care assistance Requested follow-up with [facility] due to polypharmacy  Medication reconciliation/ review completed based on most recent medication list in EHR; and in review of recent Provider follow-up appointments- confirmed patient obtained / is taking all newly prescribed medications as instructed and is aware of any changes to previous medications regimen including dosage adjustments. Caregiver manages medications; denies questions/ concerns or barriers to medication adherence at this time  Caregiver educated on red flag s/s to watch for based on current discharge diagnosis and was encouraged to report, any changes in baseline or  medication regimen,   or any new unmanaged side effects or symptoms not relieved with interventions  to PCP and / or the  [facility] Case Management team .        SDOH Interventions    Flowsheet Row Care Coordination from 07/12/2023 in [facility] Care Coordination Patient Outreach from 07/11/2023 in [place] POPULATION HEALTH DEPARTMENT  Telephone from 06/12/2023 in [place] POPULATION HEALTH DEPARTMENT Telephone from 04/25/2023 in [place] POPULATION HEALTH DEPARTMENT Office Visit from 11/02/2021 in [facility] Clinical Support from 07/12/2021 in [facility]  SDOH Interventions        Food Insecurity Interventions Intervention Not Indicated Intervention Not Indicated Intervention Not Indicated Intervention Not Indicated -- Intervention Not Indicated  Housing Interventions Intervention Not Indicated Intervention Not Indicated Intervention Not Indicated Intervention Not Indicated -- Intervention Not Indicated  Transportation Interventions Intervention Not Indicated, Patient Resources (Friends/Family), AMB Referral Intervention Not Indicated, Patient Resources (Friends/Family), AMB Referral Intervention Not Indicated, Patient Resources (Friends/Family) Intervention Not Indicated, Patient Resources Dietitian) -- Intervention Not Indicated  Utilities Interventions Intervention Not Indicated Intervention Not Indicated Intervention Not Indicated Intervention Not Indicated -- --  Depression Interventions/Treatment  -- -- -- -- Medication, Currently on Treatment --  Financial Strain Interventions -- -- -- -- -- Intervention Not Indicated  Physical Activity Interventions -- -- -- -- -- Intervention Not Indicated  Stress Interventions -- -- -- -- -- Intervention Not Indicated  Social Connections Interventions -- Intervention Not Indicated Intervention Not Indicated -- -- Intervention Not Indicated        Goals Addressed             This Visit's Progress    TOC Care Plan       Current Barriers:  Chronic Disease Management support and education needs related to Atrial Fibrillation   RNCM Clinical Goal(s):  Patient will take all medications exactly as prescribed and will call provider for  medication related questions as evidenced by no missed medication doses  attend all  scheduled medical appointments: with PCP and Cardiology  as evidenced by no missed follow-up visits- Cardiology  PCP pending  , Oncology Dr Smith Robert labs and  possible transfusions as needed   continue to work with RN Care Manager to address care management and care coordination needs related to  Atrial Fibrillation as evidenced by adherence to CM Team Scheduled appointments through collaboration with RN Care manager, provider, and care team.   Interventions: Evaluation of current treatment plan related to  self management and patient's adherence to plan as established by provider  Transitions of Care:  Goal on track:  Yes. Durable Medical Equipment (DME) needs assessed with patient/caregiver, reviewed with patient/caregiver, and orders reviewed Doctor Visits  - discussed the importance of doctor visits Health /OT/PT Lu Duffel  also seeing [facility] Post discharge activity limitations prescribed by provider reviewed  AFIB Interventions: (Status:  Goal on track:  Yes.) Short Term Goal   Reviewed importance of adherence to anticoagulant exactly as prescribed Advised patient to discuss Medications  with provider Counseled on seeking medical attention after a head injury or if there is blood in the urine/stool Screening for signs and symptoms of depression related to chronic disease state  Assessed social determinant of health barriers  Patient Goals/Self-Care Activities: Participate in Transition of Care Program/Attend TOC scheduled calls Take all medications as prescribed Attend all scheduled provider appointments Call pharmacy for medication refills 3-7 days in advance of running out of medications Attend church or other social activities Perform all self care activities independently  Perform IADL's (shopping, preparing meals, housekeeping, managing finances) independently Call provider office for new concerns or questions   Follow Up Plan:  Telephone follow up appointment with care  management team member scheduled for:  07/26/23 @ 1:00pmm The patient has been provided with contact information for the care management team and has been advised to call with any health related questions or concerns.          Plan:   Routine follow-up and on-going assessment evaluation and education of disease processes, recommended interventions for both chronic and acute medical conditions , will occur during each weekly visit along with ongoing review of symptoms ,medication reviews and reconciliation. Any updates , inconsistencies, discrepancies or acute care concerns will be addressed and routed to the correct Practitioner if indicated   Based on current information and Insurance plan -Reviewed benefits available to patient, including details about eligibility options for care if any area of needs were identified.  Reviewed patients ability to access and / or navigating the benefits system..Amb Referral made if indicted , refer to orders section of note for details   Please refer to Care Plan for goals and interventions -Effectiveness of interventions, symptom management and outcomes will be evaluated  weekly during [facility] 30-day Program Outreach calls  . Any necessary  changes and updates to Care Plan will be completed episodically    Reviewed goals for care Patient verbalizes understanding of instructions and care plan provided. Patient was encouraged to make informed decisions about their care, actively participate in managing their health condition, and implement lifestyle changes as needed to promote independence and self-management of health care   Patient was encouraged to Contact PCP with any changes in baseline or  medication regimen,  changes in health status  /  well-being, safety concerns, including falls any questions or concerns regarding ongoing medical care, any difficulty obtaining or picking up prescriptions,  any changes or worsening in condition- including  symptoms not  relieved  with interventions    The patient has been provided with contact information for the care management team and has been advised to call with any health-related questions or concerns. Follow up as indicated with Care Team , or sooner should any new problems arise.   The patient has been provided with contact information for the care management team and has been advised to call with any health related questions or concerns.   Susa Loffler , BSN, RN [facility]   [facility]-Population Health RN Care Manager Direct Dial [phone]  Fax: [phone] Website: [name]

## 2023-07-19 NOTE — Telephone Encounter (Signed)
Patient missed his lab appointment this morning at 9:45 am, so I called to see if he was ok and if he was still able to come in today, he said yes and asked if he could come in at 1:00 pm today since he had a physical therapist coming to his house at 11:00 am. I let Dr. Smith Robert scheduler know that he couldn't be here until 1:00 pm and she said that was fine.

## 2023-07-20 ENCOUNTER — Inpatient Hospital Stay: Payer: Medicare Other

## 2023-07-20 NOTE — Progress Notes (Signed)
Care Guide Pharmacy Note  07/20/2023 Name: [name] MRN: [id number] DOB: 30-Sep-1940  Referred By: [name], DO Reason for referral: Care Coordination (Outreach to schedule with pharm d )   [name] is a 83 y.o. year old male who is a primary care patient of [name], DO.  [name] was referred to the pharmacist for assistance related to: Atrial Fibrillation  Successful contact was made with the patient to discuss pharmacy services including being ready for the pharmacist to call at least 5 minutes before the scheduled appointment time and to have medication bottles and any blood pressure readings ready for review. The patient agreed to meet with the pharmacist via telephone visit on (date/time).08/01/2023  [name] , RMA     [place]  [facility] Guide  Direct Dial: [phone]  Website: [name]

## 2023-07-21 DIAGNOSIS — N184 Chronic kidney disease, stage 4 (severe): Secondary | ICD-10-CM | POA: Diagnosis not present

## 2023-07-21 DIAGNOSIS — D631 Anemia in chronic kidney disease: Secondary | ICD-10-CM | POA: Diagnosis not present

## 2023-07-21 DIAGNOSIS — I5020 Unspecified systolic (congestive) heart failure: Secondary | ICD-10-CM | POA: Diagnosis not present

## 2023-07-21 DIAGNOSIS — I4891 Unspecified atrial fibrillation: Secondary | ICD-10-CM | POA: Diagnosis not present

## 2023-07-21 DIAGNOSIS — I3139 Other pericardial effusion (noninflammatory): Secondary | ICD-10-CM | POA: Diagnosis not present

## 2023-07-21 DIAGNOSIS — I130 Hypertensive heart and chronic kidney disease with heart failure and stage 1 through stage 4 chronic kidney disease, or unspecified chronic kidney disease: Secondary | ICD-10-CM | POA: Diagnosis not present

## 2023-07-22 DIAGNOSIS — M25531 Pain in right wrist: Secondary | ICD-10-CM | POA: Diagnosis not present

## 2023-07-22 DIAGNOSIS — E039 Hypothyroidism, unspecified: Secondary | ICD-10-CM | POA: Diagnosis not present

## 2023-07-22 DIAGNOSIS — D7581 Myelofibrosis: Secondary | ICD-10-CM | POA: Diagnosis not present

## 2023-07-22 DIAGNOSIS — G4700 Insomnia, unspecified: Secondary | ICD-10-CM | POA: Diagnosis not present

## 2023-07-22 DIAGNOSIS — B9620 Unspecified Escherichia coli [E. coli] as the cause of diseases classified elsewhere: Secondary | ICD-10-CM | POA: Diagnosis not present

## 2023-07-22 DIAGNOSIS — N401 Enlarged prostate with lower urinary tract symptoms: Secondary | ICD-10-CM | POA: Diagnosis not present

## 2023-07-22 DIAGNOSIS — I4891 Unspecified atrial fibrillation: Secondary | ICD-10-CM | POA: Diagnosis not present

## 2023-07-22 DIAGNOSIS — K219 Gastro-esophageal reflux disease without esophagitis: Secondary | ICD-10-CM | POA: Diagnosis not present

## 2023-07-22 DIAGNOSIS — G9341 Metabolic encephalopathy: Secondary | ICD-10-CM | POA: Diagnosis not present

## 2023-07-22 DIAGNOSIS — I3139 Other pericardial effusion (noninflammatory): Secondary | ICD-10-CM | POA: Diagnosis not present

## 2023-07-22 DIAGNOSIS — D631 Anemia in chronic kidney disease: Secondary | ICD-10-CM | POA: Diagnosis not present

## 2023-07-22 DIAGNOSIS — N184 Chronic kidney disease, stage 4 (severe): Secondary | ICD-10-CM | POA: Diagnosis not present

## 2023-07-22 DIAGNOSIS — N281 Cyst of kidney, acquired: Secondary | ICD-10-CM | POA: Diagnosis not present

## 2023-07-22 DIAGNOSIS — R338 Other retention of urine: Secondary | ICD-10-CM | POA: Diagnosis not present

## 2023-07-22 DIAGNOSIS — Z79890 Hormone replacement therapy (postmenopausal): Secondary | ICD-10-CM | POA: Diagnosis not present

## 2023-07-22 DIAGNOSIS — F0781 Postconcussional syndrome: Secondary | ICD-10-CM | POA: Diagnosis not present

## 2023-07-22 DIAGNOSIS — I4892 Unspecified atrial flutter: Secondary | ICD-10-CM | POA: Diagnosis not present

## 2023-07-22 DIAGNOSIS — R296 Repeated falls: Secondary | ICD-10-CM | POA: Diagnosis not present

## 2023-07-22 DIAGNOSIS — I130 Hypertensive heart and chronic kidney disease with heart failure and stage 1 through stage 4 chronic kidney disease, or unspecified chronic kidney disease: Secondary | ICD-10-CM | POA: Diagnosis not present

## 2023-07-22 DIAGNOSIS — Z8673 Personal history of transient ischemic attack (TIA), and cerebral infarction without residual deficits: Secondary | ICD-10-CM | POA: Diagnosis not present

## 2023-07-22 DIAGNOSIS — I5020 Unspecified systolic (congestive) heart failure: Secondary | ICD-10-CM | POA: Diagnosis not present

## 2023-07-22 DIAGNOSIS — N179 Acute kidney failure, unspecified: Secondary | ICD-10-CM | POA: Diagnosis not present

## 2023-07-22 DIAGNOSIS — E785 Hyperlipidemia, unspecified: Secondary | ICD-10-CM | POA: Diagnosis not present

## 2023-07-22 DIAGNOSIS — Z7901 Long term (current) use of anticoagulants: Secondary | ICD-10-CM | POA: Diagnosis not present

## 2023-07-22 DIAGNOSIS — N3001 Acute cystitis with hematuria: Secondary | ICD-10-CM | POA: Diagnosis not present

## 2023-07-24 DIAGNOSIS — M5412 Radiculopathy, cervical region: Secondary | ICD-10-CM | POA: Diagnosis not present

## 2023-07-24 DIAGNOSIS — M9903 Segmental and somatic dysfunction of lumbar region: Secondary | ICD-10-CM | POA: Diagnosis not present

## 2023-07-24 DIAGNOSIS — M5033 Other cervical disc degeneration, cervicothoracic region: Secondary | ICD-10-CM | POA: Diagnosis not present

## 2023-07-24 DIAGNOSIS — M9901 Segmental and somatic dysfunction of cervical region: Secondary | ICD-10-CM | POA: Diagnosis not present

## 2023-07-25 ENCOUNTER — Other Ambulatory Visit: Payer: Self-pay

## 2023-07-25 DIAGNOSIS — N184 Chronic kidney disease, stage 4 (severe): Secondary | ICD-10-CM

## 2023-07-25 NOTE — Progress Notes (Unsigned)
   07/25/2023  Patient ID: [name], male   DOB: 09-Apr-1941, 83 y.o.   MRN: [id number]  Outreach attempt for scheduled telephone visit to assist with Mr. Ploeger medications was unsuccessful.  I attempted to reach his wife, [name] [facility]), twice and left HIPAA compliant voicemail with my direct phone number both times.  Will attempt to contact them again next week if I do not hear back.  Lenna Gilford, PharmD, DPLA

## 2023-07-26 ENCOUNTER — Telehealth: Payer: Self-pay | Admitting: *Deleted

## 2023-07-26 ENCOUNTER — Encounter: Payer: Self-pay | Admitting: Oncology

## 2023-07-26 ENCOUNTER — Inpatient Hospital Stay: Payer: Medicare Other

## 2023-07-26 ENCOUNTER — Inpatient Hospital Stay (HOSPITAL_BASED_OUTPATIENT_CLINIC_OR_DEPARTMENT_OTHER): Payer: Medicare Other | Admitting: Oncology

## 2023-07-26 ENCOUNTER — Other Ambulatory Visit: Payer: Self-pay

## 2023-07-26 VITALS — BP 113/72 | HR 61 | Temp 97.1°F | Resp 18 | Wt 169.0 lb

## 2023-07-26 DIAGNOSIS — I129 Hypertensive chronic kidney disease with stage 1 through stage 4 chronic kidney disease, or unspecified chronic kidney disease: Secondary | ICD-10-CM | POA: Diagnosis not present

## 2023-07-26 DIAGNOSIS — R590 Localized enlarged lymph nodes: Secondary | ICD-10-CM

## 2023-07-26 DIAGNOSIS — D649 Anemia, unspecified: Secondary | ICD-10-CM

## 2023-07-26 DIAGNOSIS — D7581 Myelofibrosis: Secondary | ICD-10-CM

## 2023-07-26 DIAGNOSIS — I4891 Unspecified atrial fibrillation: Secondary | ICD-10-CM

## 2023-07-26 DIAGNOSIS — I130 Hypertensive heart and chronic kidney disease with heart failure and stage 1 through stage 4 chronic kidney disease, or unspecified chronic kidney disease: Secondary | ICD-10-CM | POA: Diagnosis not present

## 2023-07-26 DIAGNOSIS — R161 Splenomegaly, not elsewhere classified: Secondary | ICD-10-CM

## 2023-07-26 DIAGNOSIS — B9620 Unspecified Escherichia coli [E. coli] as the cause of diseases classified elsewhere: Secondary | ICD-10-CM | POA: Diagnosis not present

## 2023-07-26 DIAGNOSIS — G9341 Metabolic encephalopathy: Secondary | ICD-10-CM | POA: Diagnosis not present

## 2023-07-26 DIAGNOSIS — N184 Chronic kidney disease, stage 4 (severe): Secondary | ICD-10-CM | POA: Diagnosis not present

## 2023-07-26 DIAGNOSIS — N3001 Acute cystitis with hematuria: Secondary | ICD-10-CM | POA: Diagnosis not present

## 2023-07-26 DIAGNOSIS — I5021 Acute systolic (congestive) heart failure: Secondary | ICD-10-CM

## 2023-07-26 DIAGNOSIS — N183 Chronic kidney disease, stage 3 unspecified: Secondary | ICD-10-CM

## 2023-07-26 DIAGNOSIS — H33001 Unspecified retinal detachment with retinal break, right eye: Secondary | ICD-10-CM

## 2023-07-26 DIAGNOSIS — D631 Anemia in chronic kidney disease: Secondary | ICD-10-CM

## 2023-07-26 DIAGNOSIS — N1832 Chronic kidney disease, stage 3b: Secondary | ICD-10-CM

## 2023-07-26 DIAGNOSIS — I3139 Other pericardial effusion (noninflammatory): Secondary | ICD-10-CM | POA: Diagnosis not present

## 2023-07-26 DIAGNOSIS — D509 Iron deficiency anemia, unspecified: Secondary | ICD-10-CM | POA: Diagnosis not present

## 2023-07-26 LAB — CBC (CANCER CENTER ONLY)
HCT: 28.7 % — ABNORMAL LOW (ref 39.0–52.0)
Hemoglobin: 8.6 g/dL — ABNORMAL LOW (ref 13.0–17.0)
MCH: 28.6 pg (ref 26.0–34.0)
MCHC: 30 g/dL (ref 30.0–36.0)
MCV: 95.3 fL (ref 80.0–100.0)
Platelet Count: 240 10*3/uL (ref 150–400)
RBC: 3.01 MIL/uL — ABNORMAL LOW (ref 4.22–5.81)
RDW: 21.2 % — ABNORMAL HIGH (ref 11.5–15.5)
WBC Count: 4.9 10*3/uL (ref 4.0–10.5)
nRBC: 1.9 % — ABNORMAL HIGH (ref 0.0–0.2)

## 2023-07-26 LAB — SAMPLE TO BLOOD BANK

## 2023-07-26 MED ORDER — EPOETIN ALFA-EPBX 40000 UNIT/ML IJ SOLN
40000.0000 [IU] | Freq: Once | INTRAMUSCULAR | Status: AC
  Administered 2023-07-26: 40000 [IU] via SUBCUTANEOUS
  Filled 2023-07-26: qty 1

## 2023-07-26 MED ORDER — EPOETIN ALFA-EPBX 10000 UNIT/ML IJ SOLN
10000.0000 [IU] | Freq: Once | INTRAMUSCULAR | Status: AC
  Administered 2023-07-26: 10000 [IU] via SUBCUTANEOUS
  Filled 2023-07-26: qty 1

## 2023-07-26 NOTE — Patient Instructions (Signed)
Visit Information  Thank you for taking time to visit with me today. Please don't hesitate to contact me if I can be of assistance to you before our next scheduled telephone appointment.  Our next appointment is by telephone on 2/26 at 2:30pm    Following is a copy of your care plan:   Goals Addressed             This Visit's Progress    TOC Care Plan       Current Barriers:  Chronic Disease Management support and education needs related to Atrial Fibrillation   RNCM Clinical Goal(s):  Patient will take all medications exactly as prescribed and will call provider for medication related questions as evidenced by no missed medication doses  attend all scheduled medical appointments: with PCP and Cardiology  as evidenced by no missed follow-up visits- Cardiology  PCP pending  , Oncology Dr Smith Robert labs and  possible transfusions as needed   continue to work with RN Care Manager to address care management and care coordination needs related to  Atrial Fibrillation as evidenced by adherence to CM Team Scheduled appointments through collaboration with RN Care manager, provider, and care team.   Interventions: Evaluation of current treatment plan related to  self management and patient's adherence to plan as established by provider  Transitions of Care:  Goal on track:  Yes. Durable Medical Equipment (DME) needs assessed with patient/caregiver, reviewed with patient/caregiver, and orders reviewed Doctor Visits  - discussed the importance of doctor visits Health /OT/PT Beverly Gust [phone] Post discharge activity limitations prescribed by provider reviewed  AFIB Interventions: (Status:  Goal on track:  Yes.) Short Term Goal   Reviewed importance of adherence to anticoagulant exactly as prescribed Advised patient to discuss Medications  with provider Counseled on seeking medical attention after a head injury or if there is blood in the urine/stool Screening for signs and symptoms of  depression related to chronic disease state  Assessed social determinant of health barriers  Patient Goals/Self-Care Activities: Participate in Transition of Care Program/Attend TOC scheduled calls Take all medications as prescribed Attend all scheduled provider appointments Call pharmacy for medication refills 3-7 days in advance of running out of medications Attend church or other social activities Perform all self care activities independently  Perform IADL's (shopping, preparing meals, housekeeping, managing finances) independently Call provider office for new concerns or questions   Follow Up Plan:  Telephone follow up appointment with care management team member scheduled for:  08/02/23 @ 1:00pmm The patient has been provided with contact information for the care management team and has been advised to call with any health related questions or concerns.        Medication review  Reviewed current home medications -- provided education as needed. Patient is aware of potential side effects and was encouraged to notify PCP for any adverse side effects or unwanted symptoms not relieved with interventions  Patient will call 911 for Medical Emergencies or Life -Threatening Symptoms.  Reviewed goals for care Patient/ Caregiver verbalizes understanding of instructions with the plan of care . The  Patient / Caregiver was encouraged to make informed decisions about care, actively participate in managing health conditions, and implement lifestyle changes as needed to promote independence and self-management of healthcare. SDOH screenings have been completed and addressed if indicted.  There are no reported barriers to care.    Follow-up Plan [facility] Case Management Nurse will provide follow-up and on-going assessment ,evaluation and education of disease processes, recommended interventions  for both chronic and acute medical conditions ,  along with ongoing review of symptoms ,medication reviews /  reconciliation during each weekly call . Any updates , inconsistencies, discrepancies or acute care concerns will be addressed and routed to the correct Practitioner if indicated   [facility]  Please call the care guide team at [phone]  if you need to cancel or reschedule your appointment . For scheduled calls -Three attempts will be made to reach you -if the scheduled call is missed or  we are unable to reach the you after 3 attempts no additional outreach attempts will be made and the TOC follow-up will be closed .   If you need to speak to a Nurse you may  call me directly at the number below or if I am unavailable,and  your need is urgent  please call the main [facility] number at [phone] and ask to speak with one of the [facility] ( Transition of Care )  Nurses  .  Patient was encouraged to Contact PCP with any changes in baseline or  medication regimen,  changes in health status  /  well-being, safety concerns, including falls any questions or concerns regarding ongoing medical care, any difficulty obtaining or picking up prescriptions, any changes or worsening in condition- including  symptoms not relieved  with interventions                                                                            Additionally, If you experience worsening of your symptoms, develop shortness of breath, If you are experiencing a medical emergency,  develop suicidal or homicidal thoughts you must seek medical attention immediately by calling 911 or report to your local emergency department or urgent care.   If you have a non-emergency medical problem during routine business hours, please contact your provider's office and ask to speak with a nurse.       Please take the time to read instructions/literature along with the possible adverse reactions/side effects for all the Medicines that have been prescribed to you. Only take newly prescribed  Medications after you have completely understood and accept  all the possible adverse reactions/side effects.   Do not take more than prescribed Medications for  Pain, Sleep and Anxiety. Do not drive when taking Pain medications or sleep aid/ insomnia  medications It is not advisable to combine anxiety, sleep and pain medications without talking with your primary care practitioner    If you are experiencing a Mental Health or Behavioral Health Crisis or need someone to talk to Please call the Suicide and Crisis Lifeline: 988 You may also call the [place] National Suicide Prevention Lifeline: 9034793140 or TTY: (559)845-4979 TTY 650-691-6121) to talk to a trained counselor.  You may call the Behavioral Health Crisis Line at 920-592-5639, at any time, 24 hours a day, 7 days a week- however If you are in danger or need immediate medical attention, call 911.   If you would like help to quit smoking, call 1-800-QUIT-NOW ( 914-105-5543) OR Espaol: 1-855-Djelo-Ya (7-564-332-9518) o para ms informacin haga clic aqu or Text READY to 841-660 to register via text.   Susa Loffler , BSN, RN [employer]  [facility]-Population Health RN Care Manager Direct Dial [phone]  Website: [email or web address]

## 2023-07-26 NOTE — Patient Outreach (Signed)
Care Management  Transitions of Care Program Transitions of Care Post-discharge week 3   07/26/2023 Name: [name] MRN: [id number] DOB: Feb 05, 1941  Subjective: [name] is a 83 y.o. year old male who is a primary care patient of [name], DO. The Care Management team Engaged with patient Engaged with patient by telephone to assess and address transitions of care needs.   Consent to Services:  Patient was given information about care management services, agreed to services, and gave verbal consent to participate.   Assessment:   Patient/ Caregiver  voices no new complaints or concerns  and has not developed/ reported any new medical issues / Dx or acute changes. - since last follow-up call for most recent Hospital stay   1/31-2/3 / 2025 Per spouse he is doing better He is scheduled for labs and an infusion in am She is concerned about weather. He is still followed by [facility] , she does express concern regarding being avvle to continue to transport  him  She did nit recall hearing form [facility] pharmacy in regards to polypharmacy referral , will resubmit request  Medication reconciliation/ review completed based on most recent medication list in EHR; and in review of recent Provider follow-up appointments- confirmed patient obtained / is taking all newly prescribed medications as instructed and is aware of any changes to previous medications regimen including dosage adjustments. Patient self-Caregiver manages medications; denies questions/ concerns or barriers to medication adherence at this time  Patient / Caregiver educated on red flag s/s to watch for based on current discharge diagnosis and was encouraged to report, any changes in baseline or  medication regimen,   or any new unmanaged side effects or symptoms not relieved with interventions  to PCP and / or the  [facility] Case Management team .          SDOH Interventions    Flowsheet Row Care Coordination from 07/12/2023 in  [facility] Care Coordination Patient Outreach from 07/11/2023 in [place] POPULATION [facility] DEPARTMENT Telephone from 06/12/2023 in [place] POP[facility]ATION HEALTH DEPARTMENT Telephone from 04/25/2023 in [place] POPULATION HEALTH DEPARTMENT Office Visit from 11/02/2021 in [facility] Clinical Support from 07/12/2021 in [facility]  SDOH Interventions        Food Insecurity Interventions Intervention Not Indicated Intervention Not Indicated Intervention Not Indicated Intervention Not Indicated -- Intervention Not Indicated  Housing Interventions Intervention Not Indicated Intervention Not Indicated Intervention Not Indicated Intervention Not Indicated -- Intervention Not Indicated  Transportation Interventions Intervention Not Indicated, Patient Resources (Friends/Family), AMB Referral Intervention Not Indicated, Patient Resources (Friends/Family), AMB Referral Intervention Not Indicated, Patient Resources (Friends/Family) Intervention Not Indicated, Patient Resources Dietitian) -- Intervention Not Indicated  Utilities Interventions Intervention Not Indicated Intervention Not Indicated Intervention Not Indicated Intervention Not Indicated -- --  Depression Interventions/Treatment  -- -- -- -- Medication, Currently on Treatment --  Financial Strain Interventions -- -- -- -- -- Intervention Not Indicated  Physical Activity Interventions -- -- -- -- -- Intervention Not Indicated  Stress Interventions -- -- -- -- -- Intervention Not Indicated  Social Connections Interventions -- Intervention Not Indicated Intervention Not Indicated -- -- Intervention Not Indicated        Goals Addressed             This Visit's Progress    TOC Care Plan       Current Barriers:  Chronic Disease Management support and education needs related to  Atrial Fibrillation   RNCM Clinical Goal(s):  Patient will take all medications exactly as  prescribed and will call provider for medication related questions as evidenced by no missed medication doses  attend all scheduled medical appointments: with PCP and Cardiology  as evidenced by no missed follow-up visits- Cardiology  PCP pending  , Oncology Dr Smith Robert labs and  possible transfusions as needed   continue to work with RN Care Manager to address care management and care coordination needs related to  Atrial Fibrillation as evidenced by adherence to CM Team Scheduled appointments through collaboration with RN Care manager, provider, and care team.   Interventions: Evaluation of current treatment plan related to  self management and patient's adherence to plan as established by provider  Transitions of Care:  Goal on track:  Yes. Durable Medical Equipment (DME) needs assessed with patient/caregiver, reviewed with patient/caregiver, and orders reviewed Doctor Visits  - discussed the importance of doctor visits Health /OT/PT Beverly Gust [phone] Post discharge activity limitations prescribed by provider reviewed  AFIB Interventions: (Status:  Goal on track:  Yes.) Short Term Goal   Reviewed importance of adherence to anticoagulant exactly as prescribed Advised patient to discuss Medications  with provider Counseled on seeking medical attention after a head injury or if there is blood in the urine/stool Screening for signs and symptoms of depression related to chronic disease state  Assessed social determinant of health barriers  Patient Goals/Self-Care Activities: Participate in Transition of Care Program/Attend TOC scheduled calls Take all medications as prescribed Attend all scheduled provider appointments Call pharmacy for medication refills 3-7 days in advance of running out of medications Attend church or other social activities Perform all self care activities independently  Perform IADL's (shopping, preparing meals, housekeeping, managing finances) independently Call  provider office for new concerns or questions   Follow Up Plan:  Telephone follow up appointment with care management team member scheduled for:  08/02/23 @ 1:00pmm The patient has been provided with contact information for the care management team and has been advised to call with any health related questions or concerns.          Plan:  Spouse will  check with Oncology to see if labs and Infusions can be done @ home.  Will resend referral for [facility] Pharmacist review for polypharmacy -she stated she has not spoken to anyone  Routine follow-up and on-going assessment evaluation and education of disease processes, recommended interventions for both chronic and acute medical conditions , will occur during each weekly visit along with ongoing review of symptoms ,medication reviews and reconciliation. Any updates , inconsistencies, discrepancies or acute care concerns will be addressed and routed to the correct Practitioner if indicated   Based on current information and Insurance plan -Reviewed benefits available to patient, including details about eligibility options for care if any area of needs were identified.  Reviewed patients ability to access and / or navigating the benefits system..Amb Referral made if indicted , refer to orders section of note for details   Please refer to Care Plan for goals and interventions -Effectiveness of interventions, symptom management and outcomes will be evaluated  weekly during [facility] 30-day Program Outreach calls  . Any necessary  changes and updates to Care Plan will be completed episodically    Reviewed goals for care Patient verbalizes understanding of instructions and care plan provided. Patient was encouraged to make informed decisions about their care, actively participate in managing their health condition, and implement lifestyle changes as needed to  promote independence and self-management of health care   Patient was encouraged to Contact PCP with any  changes in baseline or  medication regimen,  changes in health status  /  well-being, safety concerns, including falls any questions or concerns regarding ongoing medical care, any difficulty obtaining or picking up prescriptions, any changes or worsening in condition- including  symptoms not relieved  with interventions    The patient has been provided with contact information for the care management team and has been advised to call with any health-related questions or concerns. Follow up as indicated with Care Team , or sooner should any new problems arise.   The patient has been provided with contact information for the care management team and has been advised to call with any health related questions or concerns.   Susa Loffler , BSN, RN [facility]   [facility]-Population Health RN Care Manager Direct Dial [phone]  Fax: [phone] Website: [name]

## 2023-07-26 NOTE — Telephone Encounter (Signed)
Contacted patient and patient's son (Shamere [phone]). Confirmed apts for bone marrow biopsy. Pt son can transport pt to apts next week  Bone marrow scheduled Thurs 2/27 at 9:30a an arrive at 8:30a. Instructed pt to [facility] at the heart[facility]vascular entrance [facility]. Pt advised not to eat/drink after midnight anything prior to procedure due to the sedation.

## 2023-07-26 NOTE — Progress Notes (Signed)
Hematology/Oncology Consult note [facility]  [phone] [phone]  Patient Care Team: [name], DO as PCP - General (Family Medicine) Mariah Milling Tollie Pizza, MD as PCP - Cardiology (Cardiology) Jolayne Panther, MD as Referring Physician (Hematology) Sherrie George, MD as Consulting Physician (Ophthalmology) Micki Riley, MD as Consulting Physician (Neurology) Jerilee Field, MD as Consulting Physician (Urology) Antonieta Iba, MD as Consulting Physician (Cardiology) Riki Altes, MD (Urology) Creig Hines, MD as Consulting Physician (Hematology and Oncology) Creig Hines, MD as Consulting Physician (Oncology) Verna Czech [place], [place] as [facility] Care Management   Name of the patient: [name]  [id number]  1941-02-03   Date of visit: 07/26/23  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  At that time he was found to have a mild splenomegaly of 15.8 cm.DIPPS score is 31 (age 63- 1, hemoglobin less than 10- 2) and score of 4 if 1% circulating blasts included from 07/17/2014.   Bone marrow on 06/11/2014 was most consistent with primary myelofibrosis.  Bone marrow biopsy showed 1% abnormal cells: CD45+, CD5+, CD10, CD11c+/-, CD19+, CD2-+, (dim), CD22+ (dim, CD23+, CD38-/+, FMC7-, HLA-DR+, sig lambda+(dim).  Blasts were not increased 1.2%; hypercellular for age: 70%; JAK2 V617F mutation was negative.  CALR mutation positive.  Flow cytometry included about 1% CLL/SLL phenotype cells (CD5+) of uncertain significance and some infiltrate into the marrow with increased atypical megakaryocytes.  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  MDS FISH panel was negative.   Patient also follows up with [facility]  benign hematology Dr. Ester Rink for his anemia.  Patient's hemoglobin was drifting down to the eights and was started on EPO in October 2022.  Interval history-patient reports ongoing fatigue.  No recent falls.  Last hospitalization was in December  ECOG PS- 2 Pain scale- 0   Review of systems- Review of Systems  Constitutional:  Positive for malaise/fatigue. Negative for chills, fever and weight loss.  HENT:  Negative for congestion, ear discharge and nosebleeds.   Eyes:  Negative for blurred vision.  Respiratory:  Negative for cough, hemoptysis, sputum production, shortness of breath and wheezing.   Cardiovascular:  Negative for chest pain, palpitations, orthopnea and claudication.  Gastrointestinal:  Negative for abdominal pain, blood in stool, constipation, diarrhea, heartburn, melena, nausea and vomiting.  Genitourinary:  Negative for dysuria, flank pain, frequency, hematuria and urgency.  Musculoskeletal:  Negative for back pain, joint pain and myalgias.  Skin:  Negative for rash.  Neurological:  Negative for dizziness, tingling, focal weakness, seizures, weakness and headaches.  Endo/Heme/Allergies:  Does not bruise/bleed easily.  Psychiatric/Behavioral:  Negative for depression and suicidal ideas. The patient does not have insomnia.       Allergies  Allergen Reactions   Meloxicam Nausea And Vomiting     Past Medical History:  Diagnosis Date   Arthritis    Benign hypertensive renal disease    Biceps tendon rupture, right, initial encounter    COVID-19    GERD (gastroesophageal reflux disease)    Heartburn    History of kidney stones    History of retinal detachment    Hyperlipidemia    Hypertension    Hypothyroidism    Infraspinatus tendon tear, right, initial encounter    Melanoma (HCC)    hx of melanoma  resected from Right ear approximately 10-15 years ago   Myelofibrosis [facility])    Prostate hypertrophy    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   right  forearm, [facility]   Stroke (HCC) 11/2007   R brain subcortical infarct     Past Surgical History:  Procedure Laterality Date   ASPIRATION / INJECTION RENAL CYST  07/08/2015   BACK SURGERY     approx 20- 25 years ago   COLONOSCOPY     EYE SURGERY     cataract both eyes   GAS INSERTION  08/11/2011   Procedure: INSERTION OF GAS;  Surgeon: Sherrie George, MD;  Location: [facility] OR;  Service: Ophthalmology;  Laterality: Right;  [id number]   LEFT HEART CATH AND CORONARY ANGIOGRAPHY N/A 05/02/2023   Procedure: LEFT HEART CATH AND CORONARY ANGIOGRAPHY;  Surgeon: Yvonne Kendall, MD;  Location: [facility] INVASIVE CV LAB;  Service: Cardiovascular;  Laterality: N/A;   REVERSE SHOULDER ARTHROPLASTY Right 04/13/2023   Procedure: REVERSE SHOULDER ARTHROPLASTY;  Surgeon: Christena Flake, MD;  Location: [facility] ORS;  Service: Orthopedics;  Laterality: Right;   SCLERAL BUCKLE  08/11/2011   Procedure: SCLERAL BUCKLE;  Surgeon: Sherrie George, MD;  Location: [facility] OR;  Service: Ophthalmology;  Laterality: Right;   VARICOSE VEIN SURGERY      Social History   Socioeconomic History   Marital status: Married    Spouse name: [name]    Number of children: 2   Years of education: 12+   Highest education level: Some college, no degree  Occupational History   Occupation: Fish farm manager: OTHER    Comment: community   Occupation: SELF EMPLOYED    Employer: SELF EMPLOYED  Tobacco Use   Smoking status: Former    Current packs/day: 0.00    Types: Cigarettes    Quit date: 06/06/2010    Years since quitting: 13.1   Smokeless tobacco: Never  Vaping Use   Vaping status: Never Used  Substance and Sexual Activity   Alcohol use: No    Alcohol/week: 0.0 standard drinks of alcohol   Drug use: No   Sexual activity: Not Currently  Other Topics Concern   Not on file  Social History Narrative   Pt lives at home with his family.   Caffeine Use- 2 cups daily   Patient has 2 children.    Patient has some college.    Patient is  right handed.          Works full time   Social Drivers of Corporate investment banker Strain: Low Risk  (07/12/2021)   Overall Financial Resource Strain (CARDIA)    Difficulty of Paying Living Expenses: Not hard at all  Food Insecurity: No Food Insecurity (07/12/2023)   Hunger Vital Sign    Worried About Running Out of Food in the Last Year: Never true    Ran Out of Food in the Last Year: Never true  Transportation Needs: No Transportation Needs (07/12/2023)   PRAPARE - Administrator, Civil Service (Medical): No    Lack of Transportation (Non-Medical): No  Physical Activity: Inactive (07/12/2021)   Exercise Vital Sign    Days of Exercise per Week: 0 days    Minutes of Exercise per Session: 0 min  Stress: No Stress Concern Present (07/12/2021)   [employer] of Occupational Health - Occupational Stress Questionnaire    Feeling of Stress : Not at all  Social Connections: Socially Integrated (07/11/2023)   Social Connection and  Isolation Panel [NHANES]    Frequency of Communication with Friends and Family: More than three times a week    Frequency of Social Gatherings with Friends and Family: More than three times a week    Attends Religious Services: More than 4 times per year    Active Member of [employer] or Organizations: Yes    Attends Engineer, structural: More than 4 times per year    Marital Status: Married  Catering manager Violence: Not At Risk (07/12/2023)   Humiliation, Afraid, Rape, and Kick questionnaire    Fear of Current or Ex-Partner: No    Emotionally Abused: No    Physically Abused: No    Sexually Abused: No    Family History  Problem Relation Age of Onset   Heart disease Father    Dementia Sister    Diabetes Son    Kidney disease Neg Hx    Prostate cancer Neg Hx      Current Outpatient Medications:    acetaminophen (TYLENOL) 500 MG tablet, Take 1,000 mg by mouth every 6 (six) hours as needed for mild pain (pain score 1-3)., Disp: , Rfl:     amiodarone (PACERONE) 200 MG tablet, Take 200 mg by mouth daily., Disp: , Rfl:    apixaban (ELIQUIS) 2.5 MG TABS tablet, Take 1 tablet (2.5 mg total) by mouth 2 (two) times daily., Disp: 180 tablet, Rfl: 3   azelastine (ASTELIN) 0.1 % nasal spray, Place 1 spray into both nostrils 2 (two) times daily. Use in each nostril as directed (Patient taking differently: Place 1 spray into both nostrils 2 (two) times daily as needed for rhinitis or allergies. Use in each nostril as directed), Disp: 30 mL, Rfl: 0   Calcium Citrate-Vitamin D (CITRACAL + D PO), Take 1 tablet by mouth in the morning. (Patient not taking: Reported on 07/11/2023), Disp: , Rfl:    Cholecalciferol (D3-1000 PO), Take 1,000 Units by mouth in the morning., Disp: , Rfl:    ciprofloxacin (CIPRO) 500 MG tablet, Take 1 tablet (500 mg total) by mouth 2 (two) times daily. (Patient not taking: Reported on 07/11/2023), Disp: 42 tablet, Rfl: 0   cyclobenzaprine (FLEXERIL) 10 MG tablet, Take 1 tablet (10 mg total) by mouth at bedtime., Disp: 30 tablet, Rfl: 0   doxycycline (ADOXA) 100 MG tablet, Take 100 mg by mouth 2 (two) times daily. X 5 days (Patient not taking: Reported on 07/19/2023), Disp: , Rfl:    DULoxetine (CYMBALTA) 20 MG capsule, Take 1 capsule (20 mg total) by mouth at bedtime., Disp: 90 capsule, Rfl: 1   furosemide (LASIX) 40 MG tablet, Take 1 tablet (40 mg total) by mouth daily., Disp: 90 tablet, Rfl: 1   gabapentin (NEURONTIN) 300 MG capsule, Take 1 capsule (300 mg total) by mouth 2 (two) times daily., Disp: 180 capsule, Rfl: 1   ketoconazole (NIZORAL) 2 % cream, Apply twice daily to affected area on buttocks until clear, Disp: 60 g, Rfl: 2   levothyroxine (SYNTHROID) 50 MCG tablet, Take 1 tablet (50 mcg total) by mouth daily before breakfast., Disp: 90 tablet, Rfl: 0   Lidocaine-Menthol (NERVIVE ROLL-ON EX), Apply 1 application  topically every 4 (four) hours as needed (mild pain)., Disp: , Rfl:    meclizine (ANTIVERT) 25 MG tablet, TAKE  1 TABLET BY MOUTH THREE TIMES DAILYAS NEEDED FOR DIZZINESS, Disp: 90 tablet, Rfl: 6   melatonin 3 MG TABS tablet, Take 1 tablet (3 mg total) by mouth at bedtime as needed (insomnia)., Disp:  100 tablet, Rfl: 3   metoprolol succinate (TOPROL-XL) 100 MG 24 hr tablet, Take 1 tablet (100 mg total) by mouth 2 (two) times daily. Take with or immediately following a meal., Disp: 180 tablet, Rfl: 1   Multiple Vitamin (MULITIVITAMIN WITH MINERALS) TABS, Take 1 tablet by mouth in the morning., Disp: , Rfl:    pantoprazole (PROTONIX) 40 MG tablet, Take 1 tablet (40 mg total) by mouth daily., Disp: 90 tablet, Rfl: 1   predniSONE (DELTASONE) 10 MG tablet, Take 10 mg by mouth daily with breakfast. Take 3 tablets (30 mg total) by mouth daily for 7 days, THEN 2 tablets (20 mg total) daily for 7 days, THEN 1 and 1/2 tablets (15 mg total) daily for 7 days, THEN 1 tablet (10 mg total) daily for 7 days, THEN 1/2 tablet (5 mg total) daily for 7 days., Disp: , Rfl:    QUEtiapine (SEROQUEL) 25 MG tablet, Take 1 tablet (25 mg total) by mouth at bedtime., Disp: 30 tablet, Rfl: 0   simvastatin (ZOCOR) 40 MG tablet, TAKE ONE TABLET BY MOUTH EVERY DAY (Patient not taking: Reported on 07/19/2023), Disp: 90 tablet, Rfl: 3   sodium bicarbonate 650 MG tablet, Take 1 tablet (650 mg total) by mouth 2 (two) times daily., Disp: , Rfl:    tamsulosin (FLOMAX) 0.4 MG CAPS capsule, Take 2 capsules (0.8 mg total) by mouth every evening., Disp: 180 capsule, Rfl: 1   zinc gluconate 50 MG tablet, Take 50 mg by mouth in the morning., Disp: , Rfl:  No current facility-administered medications for this visit.  Facility-Administered Medications Ordered in Other Visits:    epoetin alfa-epbx (RETACRIT) injection 10,000 Units, 10,000 Units, Subcutaneous, Once, Creig Hines, MD   epoetin alfa-epbx (RETACRIT) injection 40,000 Units, 40,000 Units, Subcutaneous, Once, Creig Hines, MD  Physical exam:  Vitals:   07/26/23 1023  BP: 113/72  Pulse: 61   Resp: 18  Temp: (!) 97.1 F (36.2 C)  TempSrc: Tympanic  SpO2: 99%  Weight: 169 lb (76.7 kg)   Physical Exam Constitutional:      Comments: Sitting in a wheelchair.  Appears in no acute distress  Cardiovascular:     Rate and Rhythm: Normal rate and regular rhythm.     Heart sounds: Normal heart sounds.  Pulmonary:     Effort: Pulmonary effort is normal.     Breath sounds: Normal breath sounds.  Abdominal:     General: Bowel sounds are normal.     Palpations: Abdomen is soft.     Comments: No palpable splenomegaly  Skin:    General: Skin is warm and dry.  Neurological:     Mental Status: He is alert and oriented to person, place, and time.         Latest Ref Rng & Units 07/04/2023    3:08 PM  CMP  Glucose 70 - 99 mg/dL 161   BUN 8 - 27 mg/dL 55   Creatinine 0.96 - 1.27 mg/dL 0.45   Sodium 409 - 811 mmol/L 144   Potassium 3.5 - 5.2 mmol/L 4.6   Chloride 96 - 106 mmol/L 105   CO2 20 - 29 mmol/L 20   Calcium 8.6 - 10.2 mg/dL 7.4       Latest Ref Rng & Units 07/26/2023   10:06 AM  CBC  WBC 4.0 - 10.5 K/uL 4.9   Hemoglobin 13.0 - 17.0 g/dL 8.6   Hematocrit 91.4 - 52.0 % 28.7   Platelets 150 - 400  K/uL 240      Assessment and plan- Patient is a 83 y.o. male with history of myelofibrosis and predominant anemia presently on Retacrit here for routine follow-up  Patient's hemoglobin was stable between 9-10 on weekly Retacrit up until November 2024.  Following that he had back-to-back hospitalizations for worsening kidney functions and was discharged in December.  He did not receive consistent Retacrit at that time.  We restarted weekly Retacrit in January 2025 and despite receiving 4-5 weekly doses since then his hemoglobin has not gone back to his baseline which was between 9.5-10.5.  Today it is 8.6.  I plans to get a bone marrow biopsy at this time to see if there is any progression of his myelofibrosis.  He will continue weekly H&H and Retacrit until then.  During his  hospitalization in November 2025 patient had a CT abdomen and pelvis without contrast which showed shotty retroperitoneal adenopathy which could be reactive versus low-grade lymphoproliferative process.  Stable moderate splenomegaly.  I will get a repeat CT abdomen and pelvis without contrast to follow-up on these findings.  I will tentatively see him back in 4 weeks time with CBC ferritin and iron studies   Visit Diagnosis 1. Myelofibrosis (HCC)   2. Splenomegaly   3. Lymphadenopathy, retroperitoneal   4. Iron deficiency anemia, unspecified iron deficiency anemia type      Dr. Owens Shark, MD, MPH [facility] [phone] 07/26/2023 10:52 AM

## 2023-07-27 ENCOUNTER — Telehealth: Payer: Self-pay | Admitting: Pharmacist

## 2023-07-27 ENCOUNTER — Encounter: Payer: Self-pay | Admitting: Pharmacist

## 2023-07-27 ENCOUNTER — Ambulatory Visit: Payer: Medicare Other | Attending: Medical

## 2023-07-27 ENCOUNTER — Inpatient Hospital Stay: Payer: Medicare Other

## 2023-07-27 DIAGNOSIS — I3139 Other pericardial effusion (noninflammatory): Secondary | ICD-10-CM | POA: Insufficient documentation

## 2023-07-27 DIAGNOSIS — I4891 Unspecified atrial fibrillation: Secondary | ICD-10-CM | POA: Diagnosis not present

## 2023-07-27 NOTE — Telephone Encounter (Signed)
    Unsuccessful outreach encouraging return call to covering PharmD.  I will forward message to [place], clinic pharmd for follow up if patient not reached.  Kieth Brightly, PharmD, BCACP, CPP Clinical Pharmacist, [facility]

## 2023-07-28 ENCOUNTER — Ambulatory Visit: Payer: Self-pay | Admitting: *Deleted

## 2023-07-28 LAB — ECHOCARDIOGRAM LIMITED
Area-P 1/2: 3.12 cm2
Calc EF: 51.3 %
S' Lateral: 4.3 cm
Single Plane A2C EF: 51.7 %
Single Plane A4C EF: 49.7 %

## 2023-07-28 NOTE — Patient Instructions (Signed)
Visit Information  Thank you for taking time to visit with me today. Please don't hesitate to contact me if I can be of assistance to you.   Following are the goals we discussed today:   Goals Addressed             This Visit's Progress    COMPLETED: Community resources for caregiver support         Patient's spouse confirms having no additional community resource needs I have placed a referral with [facility] for respite care(you have been placed on the waiting list) they will contact you once a space becomes available if still needed         If you are experiencing a Mental Health or Behavioral Health Crisis or need someone to talk to, please call 911   Patient verbalizes understanding of instructions and care plan provided today and agrees to view in MyChart. Active MyChart status and patient understanding of how to access instructions and care plan via MyChart confirmed with patient.     No further follow up required: patient's spouse to contact this Child psychotherapist with any additional community resource.  Tailer Volkert, LCSW [place]  [facility] Licensed Clinical Social Worker Care Coordinator  Direct Dial: [phone]

## 2023-07-28 NOTE — Patient Outreach (Signed)
Care Coordination   Follow Up Visit Note   07/28/2023 Name: [name] MRN: [id number] DOB: 10/09/1940  [name] is a 83 y.o. year old male who sees [name], DO for primary care. I spoke with  [name]'s spouse by phone today.  What matters to the patients health and wellness today?  Patient's spouse confirms that caregiver responsibilities have decreased. Denies having any additional community resource needs at this time.    Goals Addressed             This Visit's Progress    Community resources for caregiver support         Patient's spouse confirms having no additional community resource needs I have placed a referral with [facility] for respite care(you have been placed on the waiting list) they will contact you once a space becomes available if still needed         SDOH assessments and interventions completed:  No     Care Coordination Interventions:  Yes, provided  Interventions Today    Flowsheet Row Most Recent Value  Chronic Disease   Chronic disease during today's visit Congestive Heart Failure (CHF), Other  [caregiver stress]  General Interventions   General Interventions Discussed/Reviewed General Interventions Reviewed, Doctor Visits  [Follow up phone call to assess for community resource needs.]  Doctor Visits Discussed/Reviewed Doctor Visits Discussed  Education Interventions   Education Provided Provided Education  Provided Verbal Education On [facility]  [confirmed referral for respite care completed, patient on waiting list to be used if needed in the future.]  Mental Health Interventions   Mental Health Discussed/Reviewed Mental Health Reviewed, Coping Strategies  [Caregiver stress has decreased, sleep has improved as patient's medical condition and energy improves. Pt has resumed some work activities which adds to improvement in overall mood.  Confirmed having no community resource needs at this time]        Follow up plan: No further intervention required.   Encounter Outcome:  Patient Visit Completed

## 2023-07-31 ENCOUNTER — Telehealth: Payer: Self-pay

## 2023-07-31 ENCOUNTER — Other Ambulatory Visit: Payer: Medicare Other

## 2023-07-31 DIAGNOSIS — E038 Other specified hypothyroidism: Secondary | ICD-10-CM

## 2023-07-31 NOTE — Progress Notes (Signed)
   07/31/2023  Patient ID: [name], male   DOB: 07-19-1940, 83 y.o.   MRN: [id number]  Outreach to reschedule missed telephone visit from last week to review medications was successful.  Appointment scheduled for tomorrow, 2/25 at 1130am.  Lenna Gilford, PharmD, DPLA

## 2023-08-01 ENCOUNTER — Other Ambulatory Visit: Payer: Self-pay

## 2023-08-01 ENCOUNTER — Telehealth: Payer: Self-pay

## 2023-08-01 LAB — TSH: TSH: 40.4 u[IU]/mL — ABNORMAL HIGH (ref 0.450–4.500)

## 2023-08-01 NOTE — Telephone Encounter (Signed)
 FYI to PCP.   Copied from CRM [id number]. Topic: General - Other >> Aug 01, 2023  1:17 PM Carlatta H wrote: Reason for CRM: Called to advised that patient has 2 missed PT visits in home//Dates are 1/29 and 2/21//Reasons was wife declined and in telehealth visit per wife//

## 2023-08-02 ENCOUNTER — Inpatient Hospital Stay: Payer: Medicare Other

## 2023-08-02 ENCOUNTER — Encounter: Payer: Medicare Other | Admitting: *Deleted

## 2023-08-02 ENCOUNTER — Telehealth: Payer: Self-pay

## 2023-08-02 ENCOUNTER — Other Ambulatory Visit: Payer: Self-pay | Admitting: Radiology

## 2023-08-02 ENCOUNTER — Other Ambulatory Visit: Payer: Self-pay

## 2023-08-02 VITALS — BP 107/67

## 2023-08-02 DIAGNOSIS — M9903 Segmental and somatic dysfunction of lumbar region: Secondary | ICD-10-CM | POA: Diagnosis not present

## 2023-08-02 DIAGNOSIS — G9341 Metabolic encephalopathy: Secondary | ICD-10-CM | POA: Diagnosis not present

## 2023-08-02 DIAGNOSIS — N3001 Acute cystitis with hematuria: Secondary | ICD-10-CM | POA: Diagnosis not present

## 2023-08-02 DIAGNOSIS — M9901 Segmental and somatic dysfunction of cervical region: Secondary | ICD-10-CM | POA: Diagnosis not present

## 2023-08-02 DIAGNOSIS — I130 Hypertensive heart and chronic kidney disease with heart failure and stage 1 through stage 4 chronic kidney disease, or unspecified chronic kidney disease: Secondary | ICD-10-CM | POA: Diagnosis not present

## 2023-08-02 DIAGNOSIS — M5412 Radiculopathy, cervical region: Secondary | ICD-10-CM | POA: Diagnosis not present

## 2023-08-02 DIAGNOSIS — M5033 Other cervical disc degeneration, cervicothoracic region: Secondary | ICD-10-CM | POA: Diagnosis not present

## 2023-08-02 DIAGNOSIS — I4891 Unspecified atrial fibrillation: Secondary | ICD-10-CM | POA: Diagnosis not present

## 2023-08-02 DIAGNOSIS — B9620 Unspecified Escherichia coli [E. coli] as the cause of diseases classified elsewhere: Secondary | ICD-10-CM | POA: Diagnosis not present

## 2023-08-02 DIAGNOSIS — D7581 Myelofibrosis: Secondary | ICD-10-CM

## 2023-08-02 DIAGNOSIS — D631 Anemia in chronic kidney disease: Secondary | ICD-10-CM | POA: Diagnosis not present

## 2023-08-02 DIAGNOSIS — N184 Chronic kidney disease, stage 4 (severe): Secondary | ICD-10-CM | POA: Diagnosis not present

## 2023-08-02 DIAGNOSIS — I129 Hypertensive chronic kidney disease with stage 1 through stage 4 chronic kidney disease, or unspecified chronic kidney disease: Secondary | ICD-10-CM | POA: Diagnosis not present

## 2023-08-02 DIAGNOSIS — D509 Iron deficiency anemia, unspecified: Secondary | ICD-10-CM

## 2023-08-02 DIAGNOSIS — N183 Chronic kidney disease, stage 3 unspecified: Secondary | ICD-10-CM

## 2023-08-02 DIAGNOSIS — I3139 Other pericardial effusion (noninflammatory): Secondary | ICD-10-CM | POA: Diagnosis not present

## 2023-08-02 LAB — HEMOGLOBIN AND HEMATOCRIT (CANCER CENTER ONLY)
HCT: 29.6 % — ABNORMAL LOW (ref 39.0–52.0)
Hemoglobin: 8.8 g/dL — ABNORMAL LOW (ref 13.0–17.0)

## 2023-08-02 MED ORDER — EPOETIN ALFA-EPBX 40000 UNIT/ML IJ SOLN
40000.0000 [IU] | Freq: Once | INTRAMUSCULAR | Status: AC
  Administered 2023-08-02: 40000 [IU] via SUBCUTANEOUS
  Filled 2023-08-02: qty 1

## 2023-08-02 MED ORDER — EPOETIN ALFA-EPBX 10000 UNIT/ML IJ SOLN
10000.0000 [IU] | Freq: Once | INTRAMUSCULAR | Status: AC
  Administered 2023-08-02: 10000 [IU] via SUBCUTANEOUS
  Filled 2023-08-02: qty 1

## 2023-08-02 NOTE — H&P (Signed)
 Chief Complaint: Patient was seen in consultation today for primary myelofibrosis with anemia; bone marrow biopsy with aspiration.   Referring Physician(s): Agrawal,Kavita  Supervising Physician: Marliss Coots  Patient Status: [facility] - Out-pt  History of Present Illness: [name] is an 83 y.o. male with a medical history significant for HTN, melanoma (10-15 years ago), stroke, retinal detachment, BPH and myelofibrosis diagnosed in 2016. He is familiar to IR from a bone marrow biopsy January 2016. 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   He was hospitalized in November 2024 with acute pericarditis and acute renal failure. He required a unit of PRBCs for his anemia. CT obtained during that hospitalization showed retroperitoneal adenopathy which was considered reactive versus a low-grade lymphoproliferative process. Since discharge his hemoglobin has remained below his baseline despite receiving weekly Retacrit since January 2025. His oncology team has requested a bone marrow biopsy with aspiration for further work up.    Patient resting in bed with his wife, [name], at the beside. Wife states that patient fell yesterday going up a ramp outside of their house. He landed primarily on his left hip and braced himself with his right hand. He is unsure of any significant bruising on his backside. He denies hitting his head or LOC. States that he was minimally lightheaded last night, but has been feeling okay this morning. Denies any chest pain, shortness of breath, N/V, fevers/chills. NPO since midnight.   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   right forearm, [facility]   Stroke (HCC) 11/2007   R brain subcortical infarct    Past Surgical History:  Procedure Laterality Date   ASPIRATION / INJECTION RENAL CYST  07/08/2015   BACK SURGERY     approx 20- 25 years ago   COLONOSCOPY     EYE SURGERY     cataract both eyes   GAS INSERTION  08/11/2011   Procedure: INSERTION OF GAS;  Surgeon: Sherrie George, MD;  Location: [facility] OR;  Service: Ophthalmology;  Laterality: Right;  [id number]   LEFT HEART CATH AND CORONARY ANGIOGRAPHY N/A 05/02/2023   Procedure: LEFT HEART CATH AND CORONARY ANGIOGRAPHY;  Surgeon: Yvonne Kendall, MD;  Location: [facility] INVASIVE CV LAB;  Service: Cardiovascular;  Laterality: N/A;   REVERSE SHOULDER ARTHROPLASTY Right 04/13/2023   Procedure: REVERSE SHOULDER ARTHROPLASTY;  Surgeon: Christena Flake, MD;  Location: [facility] ORS;  Service: Orthopedics;  Laterality: Right;   SCLERAL BUCKLE  08/11/2011   Procedure: SCLERAL BUCKLE;  Surgeon: Sherrie George, MD;  Location: [facility] OR;  Service: Ophthalmology;  Laterality: Right;   VARICOSE VEIN SURGERY      Allergies: Meloxicam  Medications: Prior to Admission medications   Medication Sig Start Date End Date Taking? Authorizing Provider  acetaminophen (TYLENOL) 500 MG tablet Take 1,000 mg by mouth every  6 (six) hours as needed for mild pain (pain score 1-3).    [provider]  amiodarone (PACERONE) 200 MG tablet Take 200 mg by mouth daily.    [provider]  apixaban (ELIQUIS) 2.5 MG TABS tablet Take 1 tablet (2.5 mg total) by mouth 2 (two) times daily. 06/30/23   Johnson, Megan P, DO  azelastine (ASTELIN) 0.1 % nasal spray Place 1 spray into both nostrils 2 (two) times daily. Use in each nostril as directed Patient taking  differently: Place 1 spray into both nostrils 2 (two) times daily as needed for rhinitis or allergies. Use in each nostril as directed 03/28/19   Particia Nearing, PA-C  Calcium Citrate-Vitamin D (CITRACAL + D PO) Take 1 tablet by mouth in the morning.    [provider]  Cholecalciferol (D3-1000 PO) Take 1,000 Units by mouth in the morning.    [provider]  cyclobenzaprine (FLEXERIL) 10 MG tablet Take 1 tablet (10 mg total) by mouth at bedtime. 07/07/23   Johnson, Megan P, DO  DULoxetine (CYMBALTA) 20 MG capsule Take 1 capsule (20 mg total) by mouth at bedtime. Patient not taking: Reported on 08/01/2023 06/30/23   Olevia Perches P, DO  furosemide (LASIX) 40 MG tablet Take 1 tablet (40 mg total) by mouth daily. 06/30/23   Johnson, Megan P, DO  gabapentin (NEURONTIN) 300 MG capsule Take 1 capsule (300 mg total) by mouth 2 (two) times daily. Patient taking differently: Take 300 mg by mouth at bedtime. 06/30/23   Olevia Perches P, DO  ketoconazole (NIZORAL) 2 % cream Apply twice daily to affected area on buttocks until clear Patient not taking: Reported on 08/01/2023 03/29/23   Deirdre Evener, MD  levothyroxine (SYNTHROID) 50 MCG tablet Take 1 tablet (50 mcg total) by mouth daily before breakfast. 06/18/23   Laural Benes, Megan P, DO  Lidocaine-Menthol (NERVIVE ROLL-ON EX) Apply 1 application  topically every 4 (four) hours as needed (mild pain).    [provider]  meclizine (ANTIVERT) 25 MG tablet TAKE 1 TABLET BY MOUTH THREE TIMES DAILYAS NEEDED FOR DIZZINESS 02/10/23   Cannady, Jolene T, NP  melatonin 3 MG TABS tablet Take 1 tablet (3 mg total) by mouth at bedtime as needed (insomnia). 07/07/23   Johnson, Megan P, DO  metoprolol succinate (TOPROL-XL) 100 MG 24 hr tablet Take 1 tablet (100 mg total) by mouth 2 (two) times daily. Take with or immediately following a meal. Patient taking differently: Take 25 mg by mouth daily. Take with or immediately following a meal. 07/07/23    Johnson, Megan P, DO  Multiple Vitamin (MULITIVITAMIN WITH MINERALS) TABS Take 1 tablet by mouth in the morning.    [provider]  pantoprazole (PROTONIX) 40 MG tablet Take 1 tablet (40 mg total) by mouth daily. 06/30/23   Johnson, Megan P, DO  QUEtiapine (SEROQUEL) 25 MG tablet Take 1 tablet (25 mg total) by mouth at bedtime. 07/07/23   Johnson, Megan P, DO  simvastatin (ZOCOR) 40 MG tablet TAKE ONE TABLET BY MOUTH EVERY DAY 05/12/23   Johnson, Megan P, DO  sodium bicarbonate 650 MG tablet Take 1 tablet (650 mg total) by mouth 2 (two) times daily. Patient not taking: Reported on 08/01/2023 05/09/23   Marrion Coy, MD  tamsulosin (FLOMAX) 0.4 MG CAPS capsule Take 2 capsules (0.8 mg total) by mouth every evening. 06/30/23   Olevia Perches P, DO  zinc gluconate 50 MG tablet Take 50 mg by mouth in the morning.  [provider]     Family History  Problem Relation Age of Onset   Heart disease Father    Dementia Sister    Diabetes Son    Kidney disease Neg Hx    Prostate cancer Neg Hx     Social History   Socioeconomic History   Marital status: Married    Spouse name: [name]    Number of children: 2   Years of education: 12+   Highest education level: Some college, no degree  Occupational History   Occupation: Fish farm manager: OTHER    Comment: community   Occupation: SELF EMPLOYED    Employer: SELF EMPLOYED  Tobacco Use   Smoking status: Former    Current packs/day: 0.00    Types: Cigarettes    Quit date: 06/06/2010    Years since quitting: 13.1   Smokeless tobacco: Never  Vaping Use   Vaping status: Never Used  Substance and Sexual Activity   Alcohol use: No    Alcohol/week: 0.0 standard drinks of alcohol   Drug use: No   Sexual activity: Not Currently  Other Topics Concern   Not on file  Social History Narrative   Pt lives at home with his family.   Caffeine Use- 2 cups daily   Patient has 2 children.    Patient has some college.    Patient is  right handed.          Works full time   Social Drivers of Corporate investment banker Strain: Low Risk  (07/12/2021)   Overall Financial Resource Strain (CARDIA)    Difficulty of Paying Living Expenses: Not hard at all  Food Insecurity: No Food Insecurity (07/12/2023)   Hunger Vital Sign    Worried About Running Out of Food in the Last Year: Never true    Ran Out of Food in the Last Year: Never true  Transportation Needs: No Transportation Needs (07/12/2023)   PRAPARE - Administrator, Civil Service (Medical): No    Lack of Transportation (Non-Medical): No  Physical Activity: Inactive (07/12/2021)   Exercise Vital Sign    Days of Exercise per Week: 0 days    Minutes of Exercise per Session: 0 min  Stress: No Stress Concern Present (07/12/2021)   [employer] of Occupational Health - Occupational Stress Questionnaire    Feeling of Stress : Not at all  Social Connections: Socially Integrated (07/11/2023)   Social Connection and Isolation Panel [NHANES]    Frequency of Communication with Friends and Family: More than three times a week    Frequency of Social Gatherings with Friends and Family: More than three times a week    Attends Religious Services: More than 4 times per year    Active Member of Clubs or Organizations: Yes    Attends Engineer, structural: More than 4 times per year    Marital Status: Married   Review of Systems No headache, chest pain, shortness of breath, nausea/vomiting, fever/chills. All other ROS negative.  Vital Signs: BP 96/69   Pulse 73   Temp 98.1 F (36.7 C) (Oral)   Resp 11   Ht 5\' 11"  (1.803 m)   Wt 169 lb 1.5 oz (76.7 kg)   SpO2 95%   BMI 23.58 kg/m   Physical Exam Vitals reviewed.  Constitutional:      Appearance: Normal appearance.  HENT:     Head: Normocephalic and atraumatic.     Mouth/Throat:  Mouth: Mucous membranes are moist.     Pharynx: Oropharynx is clear.  Cardiovascular:     Rate and Rhythm: Normal  rate and regular rhythm.     Heart sounds: Normal heart sounds.  Pulmonary:     Effort: Pulmonary effort is normal.     Comments: Decreased breath sounds throughout Abdominal:     General: Abdomen is flat.     Palpations: Abdomen is soft.     Tenderness: There is no abdominal tenderness.  Musculoskeletal:        General: Normal range of motion.     Cervical back: Normal range of motion.  Skin:    General: Skin is warm and dry.  Neurological:     General: No focal deficit present.     Mental Status: He is alert and oriented to person, place, and time. Mental status is at baseline.  Psychiatric:        Mood and Affect: Mood normal.        Behavior: Behavior normal.        Judgment: Judgment normal.     Imaging: ECHOCARDIOGRAM LIMITED Result Date: 07/28/2023    ECHOCARDIOGRAM LIMITED REPORT   Patient Name:   SR. Jaeger A Klimas Date of Exam: 07/27/2023 Medical Rec #:  [id number]         Height:       70.0 in Accession #:    [id number]        Weight:       169.0 lb Date of Birth:  [birth year]         BSA:          1.943 m Patient Age:    83 years          BP:           127/72 mmHg Patient Gender: M                 HR:           65 bpm. Exam Location:  [facility] Procedure: Limited Echo, Limited Color Doppler and 3D Echo (Both Spectral and            Color Flow Doppler were utilized during procedure). Indications:    I30 Pericarditis; I31.3 Pericardial effusion (noninflammatory);                 I48.91* Unspeicified atrial fibrillation  History:        Patient has prior history of Echocardiogram examinations, most                 recent 05/07/2023. CHF, Pericardial Disease, Previous Myocardial                 Infarction and CAD, Arrythmias:Atrial Fibrillation; Risk                 Factors:Former Smoker.  Sonographer:    Quentin Ore RDMS, RVT, RDCS Referring Phys: [id number] CADENCE H FURTH IMPRESSIONS  1. Left ventricular ejection fraction, by estimation, is 50 to 55%. Left ventricular ejection fraction  by 3D volume is 54 %. Left ventricular ejection fraction by 2D MOD biplane is 51.3 %. The left ventricle has low normal function. The left ventricle has no regional wall motion abnormalities. The left ventricular internal cavity size was mildly dilated. Left ventricular diastolic parameters are consistent with Grade I diastolic dysfunction (impaired relaxation).  2. Right ventricular systolic function is normal. The right ventricular size is normal.  3. The mitral valve is normal in  structure. Mild to moderate mitral valve regurgitation. No evidence of mitral stenosis.  4. The aortic valve is tricuspid. Aortic valve regurgitation is not visualized. No aortic stenosis is present. FINDINGS  Left Ventricle: Left ventricular ejection fraction, by estimation, is 50 to 55%. Left ventricular ejection fraction by 2D MOD biplane is 51.3 %. Left ventricular ejection fraction by 3D volume is 54 %. The left ventricle has low normal function. The left ventricle has no regional wall motion abnormalities. Strain imaging was not performed. The left ventricular internal cavity size was mildly dilated. There is no left ventricular hypertrophy. Left ventricular diastolic parameters are consistent with Grade I diastolic dysfunction (impaired relaxation). Right Ventricle: The right ventricular size is normal. No increase in right ventricular wall thickness. Right ventricular systolic function is normal. Left Atrium: Left atrial size was normal in size. Right Atrium: Right atrial size was normal in size. Pericardium: There is no evidence of pericardial effusion. Mitral Valve: The mitral valve is normal in structure. Mild to moderate mitral valve regurgitation. No evidence of mitral valve stenosis. Tricuspid Valve: The tricuspid valve is normal in structure. Tricuspid valve regurgitation is mild . No evidence of tricuspid stenosis. Aortic Valve: The aortic valve is tricuspid. Aortic valve regurgitation is not visualized. No aortic  stenosis is present. Pulmonic Valve: The pulmonic valve was normal in structure. Pulmonic valve regurgitation is not visualized. No evidence of pulmonic stenosis. Aorta: The aortic root is normal in size and structure. Venous: The inferior vena cava was not well visualized. IAS/Shunts: No atrial level shunt detected by color flow Doppler. Additional Comments: 3D was performed not requiring image post processing on an independent workstation and was normal.  LEFT VENTRICLE PLAX 2D                        Biplane EF (MOD) LVIDd:         5.90 cm         LV Biplane EF:   Left LVIDs:         4.30 cm                          ventricular LV PW:         1.00 cm                          ejection LV IVS:        1.20 cm                          fraction by                                                 2D MOD                                                 biplane is LV Volumes (MOD)                                51.3 %. LV vol d, MOD    164.0  ml A2C:                           Diastology LV vol d, MOD    155.0 ml      LV e' medial:    5.87 cm/s A4C:                           LV E/e' medial:  12.4 LV vol s, MOD    79.2 ml       LV e' lateral:   7.40 cm/s A2C:                           LV E/e' lateral: 9.8 LV vol s, MOD    77.9 ml A4C: LV SV MOD A2C:   84.8 ml       3D Volume EF LV SV MOD A4C:   155.0 ml      LV 3D EF:    Left LV SV MOD BP:    82.7 ml                    ventricul                                             ar                                             ejection                                             fraction                                             by 3D                                             volume is                                             54 %.                                 3D Volume EF:                                3D EF:        54 %                                LV EDV:  205 ml                                LV ESV:       94 ml                                LV SV:        111 ml  RIGHT VENTRICLE RV S prime:     17.00 cm/s TAPSE (M-mode): 2.3 cm MITRAL VALVE MV Area (PHT): 3.12 cm MV Decel Time: 243 msec MV E velocity: 72.80 cm/s MV A velocity: 111.00 cm/s MV E/A ratio:  0.66 Julien Nordmann MD Electronically signed by Julien Nordmann MD Signature Date/Time: 07/28/2023/12:58:49 PM    Final     Labs:  CBC: Recent Labs    06/29/23 0932 07/04/23 1508 07/05/23 1513 07/19/23 1309 07/26/23 1006 08/02/23 0929 08/03/23 0916  WBC 6.0 7.5  --   --  4.9  --  4.9  HGB 6.2* 6.9*   < > 7.9* 8.6* 8.8* 6.9*  HCT 19.9* 21.6*   < > 25.6* 28.7* 29.6* 23.4*  PLT 253 281  --   --  240  --  194   < > = values in this interval not displayed.    COAGS: Recent Labs    05/02/23 1516 05/03/23 0532  INR 1.4* 1.4*  APTT 46*  --     BMP: Recent Labs    05/08/23 0456 05/09/23 0829 05/22/23 1424 06/12/23 1602 06/29/23 1001 07/04/23 1508  NA 139 141 145 145* 140 144  K 4.7 5.0 5.3* 5.4* 4.5 4.6  CL 107 109 111 107* 106 105  CO2 21* 21* 22 21 23 20   GLUCOSE 107* 125* 119* 135* 102* 125*  BUN 93* 91* 64* 63* 74* 55*  CALCIUM 7.8* 8.3* 8.3* 7.4* 6.7* 7.4*  CREATININE 3.01* 2.83* 2.61* 3.65* 3.07* 3.21*  GFRNONAA 20* 22* 24*  --  20*  --     LIVER FUNCTION TESTS: Recent Labs    04/17/23 2017 04/20/23 0448 05/02/23 1516 05/02/23 2205 05/03/23 1115 06/29/23 1001  BILITOT 0.9  --  0.7 0.9  --  0.7  AST 17  --  13* 12*  --  15  ALT 9  --  13 13  --  22  ALKPHOS 89  --  116 111  --  67  PROT 6.1*  --  5.7* 5.5*  --  5.4*  ALBUMIN 3.0*   < > 2.6* 2.4* 2.3* 2.9*   < > = values in this interval not displayed.    TUMOR MARKERS: No results for input(s): "AFPTM", "CEA", "CA199", "CHROMGRNA" in the last 8760 hours.  Assessment and Plan:  Myelofibrosis with anemia: Maurine Minister A. [place], 83 year old male, presents today to the [facility] Interventional Radiology department for an image-guided bone marrow biopsy with aspiration.   Plan for bone marrow  biopsy with Dr. Andrey Campanile on 08/03/23  Risks and benefits of this procedure were discussed with the patient and/or patient's family including, but not limited to bleeding, infection, damage to adjacent structures or low yield requiring additional tests.  All of the questions were answered and there is agreement to proceed. He has been NPO. He is a full code.   Consent signed and in chart.  Thank you for this interesting consult.  I greatly enjoyed meeting Shalom A  Marple and look forward to participating in their care.  A copy of this report was sent to the requesting provider on this date.  Electronically Signed: Jama Flavors, PA-C 08/03/2023, 9:36 AM     I spent a total of  30 Minutes   in face to face in clinical consultation, greater than 50% of which was counseling/coordinating care for myelofibrosis.

## 2023-08-02 NOTE — Progress Notes (Signed)
 Patient for IR Bone Marrow Biopsy on Thurs 08/03/23, I called and spoke with the patient on the phone and gave pre-procedure instructions. Pt was made aware to be here at 8:30a, NPO after MN prior to procedure as well as driver post procedure/recovery/discharge. Pt stated understanding.  Called 08/02/23

## 2023-08-02 NOTE — Telephone Encounter (Signed)
 Spoke to pt regarding ECHO results. Pt stated he need clearance faxed to Dr. Elenor Legato office for knee surgery.  Will forward to Cadence

## 2023-08-02 NOTE — Patient Instructions (Signed)
 Visit Information  Thank you for taking time to visit with me today. Please don't hesitate to contact me if I can be of assistance to you before our next scheduled telephone appointment.  Our next appointment is by telephone on 3/6 at 2:00pm  Following is a copy of your care plan:   Goals Addressed             This Visit's Progress    TOC Care Plan       Current Barriers:  Chronic Disease Management support and education needs related to Atrial Fibrillation   RNCM Clinical Goal(s):  Patient will take all medications exactly as prescribed and will call provider for medication related questions as evidenced by no missed medication doses  attend all scheduled medical appointments: with PCP and Cardiology  as evidenced by no missed follow-up visits- Cardiology  PCP pending  , Oncology Dr Smith Robert labs and  possible transfusions as needed   continue to work with RN Care Manager to address care management and care coordination needs related to  Atrial Fibrillation as evidenced by adherence to CM Team Scheduled appointments through collaboration with RN Care manager, provider, and care team.   Interventions: Evaluation of current treatment plan related to  self management and patient's adherence to plan as established by provider  Transitions of Care:  Goal on track:  Yes. Durable Medical Equipment (DME) needs assessed with patient/caregiver, reviewed with patient/caregiver, and orders reviewed Doctor Visits  - discussed the importance of doctor visits Health /OT/PT [name] [phone] Post discharge activity limitations prescribed by provider reviewed  AFIB Interventions: (Status:  Goal on track:  Yes.) Short Term Goal   Reviewed importance of adherence to anticoagulant exactly as prescribed Advised patient to discuss Medications  with provider [facility] pharmacy consult consider blister packs once medications stabilize  Counseled on seeking medical attention after a head injury or if there is  blood in the urine/stool Screening for signs and symptoms of depression related to chronic disease state  Assessed social determinant of health barriers  Patient Goals/Self-Care Activities: Participate in Transition of Care Program/Attend TOC scheduled calls Take all medications as prescribed Attend all scheduled provider appointments Call pharmacy for medication refills 3-7 days in advance of running out of medications Attend church or other social activities Perform all self care activities independently  Perform IADL's (shopping, preparing meals, housekeeping, managing finances) independently Call provider office for new concerns or questions   Follow Up Plan:  Telephone follow up appointment with care management team member scheduled for:  08/10/23 @ 2:00pmm The patient has been provided with contact information for the care management team and has been advised to call with any health related questions or concerns.          Medication review  Reviewed current home medications -- provided education as needed. Patient is aware of potential side effects and was encouraged to notify PCP for any adverse side effects or unwanted symptoms not relieved with interventions  Patient will call 911 for Medical Emergencies or Life -Threatening Symptoms.  Reviewed goals for care Patient/ Caregiver verbalizes understanding of instructions with the plan of care . The  Patient / Caregiver was encouraged to make informed decisions about care, actively participate in managing health conditions, and implement lifestyle changes as needed to promote independence and self-management of healthcare. SDOH screenings have been completed and addressed if indicted.  There are no reported barriers to care.    Follow-up Plan [facility] Case Management Nurse will provide follow-up and  on-going assessment ,evaluation and education of disease processes, recommended interventions for both chronic and acute medical conditions  ,  along with ongoing review of symptoms ,medication reviews / reconciliation during each weekly call . Any updates , inconsistencies, discrepancies or acute care concerns will be addressed and routed to the correct Practitioner if indicated   [facility]  Please call the care guide team at [phone]  if you need to cancel or reschedule your appointment . For scheduled calls -Three attempts will be made to reach you -if the scheduled call is missed or  we are unable to reach the you after 3 attempts no additional outreach attempts will be made and the TOC follow-up will be closed .   If you need to speak to a Nurse you may  call me directly at the number below or if I am unavailable,and  your need is urgent  please call the main [facility] number at [phone] and ask to speak with one of the [facility] ( Transition of Care )  Nurses  .  Patient was encouraged to Contact PCP with any changes in baseline or  medication regimen,  changes in health status  /  well-being, safety concerns, including falls any questions or concerns regarding ongoing medical care, any difficulty obtaining or picking up prescriptions, any changes or worsening in condition- including  symptoms not relieved  with interventions                                                                            Additionally, If you experience worsening of your symptoms, develop shortness of breath, If you are experiencing a medical emergency,  develop suicidal or homicidal thoughts you must seek medical attention immediately by calling 911 or report to your local emergency department or urgent care.   If you have a non-emergency medical problem during routine business hours, please contact your provider's office and ask to speak with a nurse.       Please take the time to read instructions/literature along with the possible adverse reactions/side effects for all the Medicines that have been prescribed to you. Only take newly  prescribed  Medications after you have completely understood and accept all the possible adverse reactions/side effects.   Do not take more than prescribed Medications for  Pain, Sleep and Anxiety. Do not drive when taking Pain medications or sleep aid/ insomnia  medications It is not advisable to combine anxiety, sleep and pain medications without talking with your primary care practitioner    If you are experiencing a Mental Health or Behavioral Health Crisis or need someone to talk to Please call the Suicide and Crisis Lifeline: 988 You may also call the [place] National Suicide Prevention Lifeline: 715 225 0676 or TTY: 234-437-6351 TTY 440-669-0555) to talk to a trained counselor.  You may call the Behavioral Health Crisis Line at 479-777-4970, at any time, 24 hours a day, 7 days a week- however If you are in danger or need immediate medical attention, call 911.   If you would like help to quit smoking, call 1-800-QUIT-NOW ( (626) 752-6926) OR Espaol: 1-855-Djelo-Ya (0-160-109-3235) o para ms informacin haga clic aqu or Text READY to 573-220 to register via text.  [name] , BSN, RN [place]   [facility]BCI-Population Health RN Care Manager Direct Dial [phone]  Website: [name]

## 2023-08-02 NOTE — Patient Outreach (Signed)
 Care Management  Transitions of Care Program Transitions of Care Post-discharge week 4   08/02/2023 Name: [name] MRN: [id number] DOB: 05/06/1941  Subjective: [name] is a 83 y.o. year old male who is a primary care patient of [name], DO. The Care Management team Engaged with patient Engaged with patient by telephone to assess and address transitions of care needs.   Consent to Services:  Patient was given information about care management services, agreed to services, and gave verbal consent to participate.   Assessment:     Patient/ Caregiver  voices no new complaints or concerns  and has not developed/ reported any new medical issues / Dx or acute changes. - since last follow-up call for most recent  Hospital stay   1/31-2/3  / 2025  No reported or reviewed Hospital Readmissions / No Urgent Care Visits / Transfers for Acute Change in condition .  MD/Specialist or Consultant visits reported since last follow-up call He has a cho with improved function  He is scheduled for BX 08/03/23 He has an infusion today for Anemia  Patient was Alert & O x 4 ,responsive, able to voice needs, cooperative with visit   There were no noted or reported cognitive/ mental status changes during this assessment Patient denies changes in ADL function or IADL;s  at this time. He is getting better and has started working on a Chiropractor .  No reports of new onset or increased / uncontrolled pain, has chronic pain managed with medicinal and non-medicinal interventions.  No changes in skin, no rashes, wounds or open areas  Diet reviewed and  education provided . Intake is improving  Therapy Services PT  /  OT  with [facility] Notes say spouse declined PT visits  she stated they wanted to come on a Saturday  Medication reconciliation/ review completed based on most recent medication list in EHR; and in review of recent Provider follow-up appointments- confirmed patient obtained / is  taking all newly prescribed medications as instructed and is aware of any changes to previous medications regimen including dosage adjustments.  Reports  no new changes in medication/treatment  regimen  during this assessment period.   Medications and current treatments are effective for use intended. No adverse Drug reactions  reported No changes or modifications indicated at this time. She is hoping to have some medications decreased  Caregiver manages  medications Patient is able to take medications without difficulty;  denies questions/ concerns and reports no barriers to medication adherence at this time   Caregiver educated on red flag s/s to watch for based on current discharge diagnosis and was encouraged to report, any changes in baseline or  medication regimen,   or any new unmanaged side effects or symptoms not relieved with interventions  to PCP and / or the  [facility]BCI Case Management team .        SDOH Interventions    Flowsheet Row Care Coordination from 07/12/2023 in [facility] Care Coordination Patient Outreach from 07/11/2023 in [place] [facility] Telephone from 06/12/2023 in [place] [facility] DEPARTMENT Telephone from 04/25/2023 in [place] [facility] DEPARTMENT Office Visit from 11/02/2021 in [facility] Clinical Support from 07/12/2021 in [facility]  SDOH Interventions        Food Insecurity Interventions Intervention Not Indicated Intervention Not Indicated Intervention Not Indicated Intervention Not Indicated -- Intervention Not Indicated  Housing Interventions Intervention  Not Indicated Intervention Not Indicated Intervention Not Indicated Intervention Not Indicated -- Intervention Not Indicated  Transportation Interventions Intervention Not Indicated, Patient Resources (Friends/Family), AMB Referral Intervention Not Indicated, Patient Resources (Friends/Family), AMB  Referral Intervention Not Indicated, Patient Resources (Friends/Family) Intervention Not Indicated, Patient Resources (Friends/Family) -- Intervention Not Indicated  Utilities Interventions Intervention Not Indicated Intervention Not Indicated Intervention Not Indicated Intervention Not Indicated -- --  Depression Interventions/Treatment  -- -- -- -- Medication, Currently on Treatment --  Financial Strain Interventions -- -- -- -- -- Intervention Not Indicated  Physical Activity Interventions -- -- -- -- -- Intervention Not Indicated  Stress Interventions -- -- -- -- -- Intervention Not Indicated  Social Connections Interventions -- Intervention Not Indicated Intervention Not Indicated -- -- Intervention Not Indicated        Goals Addressed             This Visit's Progress    TOC Care Plan       Current Barriers:  Chronic Disease Management support and education needs related to Atrial Fibrillation   RNCM Clinical Goal(s):  Patient will take all medications exactly as prescribed and will call provider for medication related questions as evidenced by no missed medication doses  attend all scheduled medical appointments: with PCP and Cardiology  as evidenced by no missed follow-up visits- Cardiology  PCP pending  , Oncology Dr Smith Robert labs and  possible transfusions as needed   continue to work with RN Care Manager to address care management and care coordination needs related to  Atrial Fibrillation as evidenced by adherence to CM Team Scheduled appointments through collaboration with RN Care manager, provider, and care team.   Interventions: Evaluation of current treatment plan related to  self management and patient's adherence to plan as established by provider  Transitions of Care:  Goal on track:  Yes. Durable Medical Equipment (DME) needs assessed with patient/caregiver, reviewed with patient/caregiver, and orders reviewed Doctor Visits  - discussed the importance of doctor  visits Health /OT/PT Beverly Gust [phone] Post discharge activity limitations prescribed by provider reviewed  AFIB Interventions: (Status:  Goal on track:  Yes.) Short Term Goal   Reviewed importance of adherence to anticoagulant exactly as prescribed Advised patient to discuss Medications  with provider [facility] pharmacy consult consider blister packs once medications stabilize  Counseled on seeking medical attention after a head injury or if there is blood in the urine/stool Screening for signs and symptoms of depression related to chronic disease state  Assessed social determinant of health barriers  Patient Goals/Self-Care Activities: Participate in Transition of Care Program/Attend TOC scheduled calls Take all medications as prescribed Attend all scheduled provider appointments Call pharmacy for medication refills 3-7 days in advance of running out of medications Attend church or other social activities Perform all self care activities independently  Perform IADL's (shopping, preparing meals, housekeeping, managing finances) independently Call provider office for new concerns or questions   Follow Up Plan:  Telephone follow up appointment with care management team member scheduled for:  08/10/23 @ 2:00pmm The patient has been provided with contact information for the care management team and has been advised to call with any health related questions or concerns.          Plan:  She will follow-up with [facility] ( She knows the owners) and discuss compliance packaging and delivery,they are outside the delivery area however she feels they are out frequently and she can pickup scripts . She does not feel she needs  respite assist at this time  Routine follow-up and on-going assessment evaluation and education of disease processes, recommended interventions for both chronic and acute medical conditions , will occur during each weekly visit along with ongoing review of symptoms  ,medication reviews and reconciliation. Any updates , inconsistencies, discrepancies or acute care concerns will be addressed and routed to the correct Practitioner if indicated   Based on current information and Insurance plan -Reviewed benefits available to patient, including details about eligibility options for care if any area of needs were identified.  Reviewed patients ability to access and / or navigating the benefits system..Amb Referral made if indicted , refer to orders section of note for details   Please refer to Care Plan for goals and interventions -Effectiveness of interventions, symptom management and outcomes will be evaluated  weekly during [facility] 30-day Program Outreach calls  . Any necessary  changes and updates to Care Plan will be completed episodically    Reviewed goals for care Patient verbalizes understanding of instructions and care plan provided. Patient was encouraged to make informed decisions about their care, actively participate in managing their health condition, and implement lifestyle changes as needed to promote independence and self-management of health care   Patient was encouraged to Contact PCP with any changes in baseline or  medication regimen,  changes in health status  /  well-being, safety concerns, including falls any questions or concerns regarding ongoing medical care, any difficulty obtaining or picking up prescriptions, any changes or worsening in condition- including  symptoms not relieved  with interventions    The patient has been provided with contact information for the care management team and has been advised to call with any health-related questions or concerns. Follow up as indicated with Care Team , or sooner should any new problems arise. The patient has been provided with contact information for the care management team and has been advised to call with any health related questions or concerns.   Susa Loffler , BSN, RN [facility]    VBCI-Population Health RN Care Manager Direct Dial [phone]  Fax: [phone] Website: [name]

## 2023-08-02 NOTE — Progress Notes (Signed)
   08/02/2023  Patient ID: [name], male   DOB: [birth year], 83 y.o.   MRN: [id number]  Subjective/Objective Telephone visit for medication review and reconciliation  Medication Review/Reconciliation -Patient/wife would like to condense/decrease amount of medications he is currently taking -Completed medication reconciliation and identified the following discrepancies: Patient is taking metoprolol xl 25mg  daily versus xl 100mg  BID reflected on EMR Patient is not taking duloxetine 20mg  at night but would like to resume Patient is not taking sodium bicarb  Patient is taking gabapentin 300mg  at bedtime versus BID Patient is only taking 1 capsule of tamsulosin versus 2 daily, and does endorse hesitancy/frequent urination not fully emptying bladder -Wife states patient could likely benefit from refill on tramadol due to ongoing pain -Patient recently completed course of antibiotics for UTI and is no longer experiencing hallucinations, but continues to have frequent urination with burning sensation- patient does not have a fever -Patient/wife endorse pharmacy that can do medication packaging and possibly home delivery would be helpful  Assessment/Plan  Medication Review/Reconciliation -I recommend patient continue metoprolol xl 25mg  daily based on hx of hypotension- consulting PCP -Refill on file at pharmacy for duloxetine 20mg - will inform patient/wife at follow-up visit Friday to request refill from pharmacy -Sodium bicarb likely no longer indicated based on recent CMP, but I would recommend f/u CMP at upcoming PCP visit 3/3 -Recommend to continue gabapentin 300mg  at bedtime only based on age/risk of fall and sedation possibility -Increase to 2 capsules of tamsulosin daily to see if this helps but monitor BP closely -Will notify Dr. Laural Benes that pt is still experiencing UTI s/sx- informed pt/wife to call office if sx worsen or patient begins running fever -Contacting [facility] to see if  they offer medication packaging or home delivery -Medications that I recommend only using PRN, reducing dose, or stopping if able include:  cyclobenzaprine, meclizine, quetiapine  Follow-up:  Friday 2/28  Lenna Gilford, PharmD, DPLA

## 2023-08-03 ENCOUNTER — Ambulatory Visit
Admission: RE | Admit: 2023-08-03 | Discharge: 2023-08-03 | Disposition: A | Discharge status: Home / Self Care | Payer: Medicare Other | Source: Ambulatory Visit | Attending: Internal Medicine | Admitting: Internal Medicine

## 2023-08-03 ENCOUNTER — Encounter: Payer: Self-pay | Admitting: Radiology

## 2023-08-03 ENCOUNTER — Ambulatory Visit: Payer: Medicare Other | Admitting: Family Medicine

## 2023-08-03 ENCOUNTER — Other Ambulatory Visit: Payer: Self-pay

## 2023-08-03 VITALS — BP 92/55 | HR 67 | Temp 98.1°F | Resp 17 | Ht 71.0 in | Wt 169.1 lb

## 2023-08-03 DIAGNOSIS — D471 Chronic myeloproliferative disease: Secondary | ICD-10-CM | POA: Diagnosis not present

## 2023-08-03 DIAGNOSIS — D631 Anemia in chronic kidney disease: Secondary | ICD-10-CM | POA: Insufficient documentation

## 2023-08-03 DIAGNOSIS — D469 Myelodysplastic syndrome, unspecified: Secondary | ICD-10-CM

## 2023-08-03 DIAGNOSIS — D649 Anemia, unspecified: Secondary | ICD-10-CM | POA: Diagnosis not present

## 2023-08-03 DIAGNOSIS — Z87891 Personal history of nicotine dependence: Secondary | ICD-10-CM | POA: Insufficient documentation

## 2023-08-03 DIAGNOSIS — Z79899 Other long term (current) drug therapy: Secondary | ICD-10-CM | POA: Insufficient documentation

## 2023-08-03 DIAGNOSIS — D7581 Myelofibrosis: Secondary | ICD-10-CM

## 2023-08-03 DIAGNOSIS — N183 Chronic kidney disease, stage 3 unspecified: Secondary | ICD-10-CM | POA: Insufficient documentation

## 2023-08-03 DIAGNOSIS — D509 Iron deficiency anemia, unspecified: Secondary | ICD-10-CM | POA: Diagnosis not present

## 2023-08-03 HISTORY — PX: IR BONE MARROW BIOPSY & ASPIRATION: IMG5727

## 2023-08-03 LAB — CBC WITH DIFFERENTIAL/PLATELET
Abs Immature Granulocytes: 0.19 10*3/uL — ABNORMAL HIGH (ref 0.00–0.07)
Basophils Absolute: 0 10*3/uL (ref 0.0–0.1)
Basophils Relative: 0 %
Eosinophils Absolute: 0.1 10*3/uL (ref 0.0–0.5)
Eosinophils Relative: 1 %
HCT: 23.4 % — ABNORMAL LOW (ref 39.0–52.0)
Hemoglobin: 6.9 g/dL — ABNORMAL LOW (ref 13.0–17.0)
Immature Granulocytes: 4 %
Lymphocytes Relative: 11 %
Lymphs Abs: 0.5 10*3/uL — ABNORMAL LOW (ref 0.7–4.0)
MCH: 28.9 pg (ref 26.0–34.0)
MCHC: 29.5 g/dL — ABNORMAL LOW (ref 30.0–36.0)
MCV: 97.9 fL (ref 80.0–100.0)
Monocytes Absolute: 1 10*3/uL (ref 0.1–1.0)
Monocytes Relative: 20 %
Neutro Abs: 3.1 10*3/uL (ref 1.7–7.7)
Neutrophils Relative %: 64 %
Platelets: 194 10*3/uL (ref 150–400)
RBC: 2.39 MIL/uL — ABNORMAL LOW (ref 4.22–5.81)
RDW: 21.5 % — ABNORMAL HIGH (ref 11.5–15.5)
Smear Review: NORMAL
WBC: 4.9 10*3/uL (ref 4.0–10.5)
nRBC: 2.1 % — ABNORMAL HIGH (ref 0.0–0.2)

## 2023-08-03 MED ORDER — FENTANYL CITRATE (PF) 100 MCG/2ML IJ SOLN
INTRAMUSCULAR | Status: AC | PRN
  Administered 2023-08-03: 50 ug via INTRAVENOUS

## 2023-08-03 MED ORDER — MIDAZOLAM HCL 2 MG/2ML IJ SOLN
INTRAMUSCULAR | Status: AC
  Filled 2023-08-03: qty 2

## 2023-08-03 MED ORDER — MIDAZOLAM HCL 2 MG/2ML IJ SOLN
INTRAMUSCULAR | Status: AC | PRN
  Administered 2023-08-03: 1 mg via INTRAVENOUS

## 2023-08-03 MED ORDER — LIDOCAINE 1 % OPTIME INJ - NO CHARGE
8.0000 mL | Freq: Once | INTRAMUSCULAR | Status: AC
  Administered 2023-08-03: 8 mL via INTRADERMAL
  Filled 2023-08-03: qty 8

## 2023-08-03 MED ORDER — HEPARIN SOD (PORK) LOCK FLUSH 100 UNIT/ML IV SOLN
INTRAVENOUS | Status: AC
  Filled 2023-08-03: qty 5

## 2023-08-03 MED ORDER — SODIUM CHLORIDE 0.9 % IV SOLN: INTRAVENOUS | Status: DC

## 2023-08-03 MED ORDER — FENTANYL CITRATE (PF) 100 MCG/2ML IJ SOLN
INTRAMUSCULAR | Status: AC
Start: 2023-08-03 — End: ?
  Filled 2023-08-03: qty 2

## 2023-08-03 NOTE — Progress Notes (Signed)
 Family at bedside, pt eating and drinking

## 2023-08-03 NOTE — Telephone Encounter (Signed)
 Pt made aware and verbalized understanding.   Furth, Cadence H, PA-C  You57 minutes ago (12:26 PM)    Ortho needs to send official request to pre-op team

## 2023-08-03 NOTE — Progress Notes (Signed)
 Family at bedside.

## 2023-08-03 NOTE — Procedures (Signed)
Interventional Radiology Procedure Note  Procedure: CT guided aspirate and core biopsy of right iliac bone  Complications: None  Recommendations: - Bedrest supine x 1 hrs - Hydrocodone PRN  Pain - Follow biopsy results   Arel Tippen, MD   

## 2023-08-04 ENCOUNTER — Ambulatory Visit: Payer: Self-pay | Admitting: Family Medicine

## 2023-08-04 ENCOUNTER — Ambulatory Visit: Payer: Medicare Other | Admitting: Family Medicine

## 2023-08-04 ENCOUNTER — Other Ambulatory Visit: Payer: Self-pay

## 2023-08-04 ENCOUNTER — Ambulatory Visit (INDEPENDENT_AMBULATORY_CARE_PROVIDER_SITE_OTHER): Payer: Medicare Other | Admitting: Family Medicine

## 2023-08-04 VITALS — BP 118/78 | HR 89 | Temp 98.4°F | Resp 16 | Wt 165.0 lb

## 2023-08-04 DIAGNOSIS — N179 Acute kidney failure, unspecified: Secondary | ICD-10-CM | POA: Diagnosis not present

## 2023-08-04 DIAGNOSIS — G9341 Metabolic encephalopathy: Secondary | ICD-10-CM | POA: Diagnosis not present

## 2023-08-04 DIAGNOSIS — I130 Hypertensive heart and chronic kidney disease with heart failure and stage 1 through stage 4 chronic kidney disease, or unspecified chronic kidney disease: Secondary | ICD-10-CM | POA: Diagnosis not present

## 2023-08-04 DIAGNOSIS — M25531 Pain in right wrist: Secondary | ICD-10-CM | POA: Diagnosis not present

## 2023-08-04 DIAGNOSIS — M533 Sacrococcygeal disorders, not elsewhere classified: Secondary | ICD-10-CM | POA: Diagnosis not present

## 2023-08-04 DIAGNOSIS — R8281 Pyuria: Secondary | ICD-10-CM

## 2023-08-04 DIAGNOSIS — Z87440 Personal history of urinary (tract) infections: Secondary | ICD-10-CM

## 2023-08-04 DIAGNOSIS — I3139 Other pericardial effusion (noninflammatory): Secondary | ICD-10-CM | POA: Diagnosis not present

## 2023-08-04 DIAGNOSIS — B9620 Unspecified Escherichia coli [E. coli] as the cause of diseases classified elsewhere: Secondary | ICD-10-CM | POA: Diagnosis not present

## 2023-08-04 DIAGNOSIS — N3001 Acute cystitis with hematuria: Secondary | ICD-10-CM | POA: Diagnosis not present

## 2023-08-04 DIAGNOSIS — I4891 Unspecified atrial fibrillation: Secondary | ICD-10-CM | POA: Diagnosis not present

## 2023-08-04 MED ORDER — LIDOCAINE 5 % EX PTCH: 1.0000 | MEDICATED_PATCH | CUTANEOUS | 2 refills | Status: DC

## 2023-08-04 NOTE — Progress Notes (Unsigned)
   08/04/2023  Patient ID: [name], male   DOB: Feb 13, 1941, 83 y.o.   MRN: [id number]  Outreach for scheduled telephone follow-up visit.  I was able to reach patient/wife, but they requested a call Monday afternoon.  Telephone visit rescheduled to Monday at 230pm.  Lenna Gilford, PharmD, DPLA

## 2023-08-04 NOTE — Progress Notes (Signed)
 BP 118/78 (BP Location: Left Arm, Patient Position: Sitting, Cuff Size: Normal)   Pulse 89   Temp 98.4 F (36.9 C) (Oral)   Resp 16   Wt 165 lb (74.8 kg)   SpO2 96%   BMI 23.01 kg/m    Subjective:    Patient ID: [name], male    DOB: 10-07-1940, 83 y.o.   MRN: [id number]  HPI: [name] is a 83 y.o. male  Chief Complaint  Patient presents with   Fall   Following last appointment he was sent to the ER- diagnosed with a UTI that was resistant to several medications. He was treated with doxycycline x5 days after discharge. Mental status improved and CT was normal. He was taking gabapentin several times a day. He has been seeing hematology regularly and had a bone marrow biopsy yesterday.   Fell on Wednesday- onto his bottom, tailbone has been hurting, caught himself with his R wrist which had never improved since last time. It's feeling about the same since last time- better than when he first hurt it. Hallucinations are done. No problems with that anymore.   Has home health PT coming out and they have been helping but he notes that its hard to learn how to walk again.   Relevant past medical, surgical, family and social history reviewed and updated as indicated. Interim medical history since our last visit reviewed. Allergies and medications reviewed and updated.  Review of Systems  Constitutional: Negative.   Respiratory: Negative.    Cardiovascular: Negative.   Gastrointestinal: Negative.   Musculoskeletal:  Positive for back pain and myalgias. Negative for arthralgias, gait problem, joint swelling, neck pain and neck stiffness.  Skin: Negative.   Psychiatric/Behavioral: Negative.      Per HPI unless specifically indicated above     Objective:    BP 118/78 (BP Location: Left Arm, Patient Position: Sitting, Cuff Size: Normal)   Pulse 89   Temp 98.4 F (36.9 C) (Oral)   Resp 16   Wt 165 lb (74.8 kg)   SpO2 96%   BMI 23.01 kg/m   Wt Readings from Last 3  Encounters:  08/04/23 165 lb (74.8 kg)  08/03/23 169 lb 1.5 oz (76.7 kg)  07/26/23 169 lb (76.7 kg)    Physical Exam Vitals and nursing note reviewed.  Constitutional:      General: He is not in acute distress.    Appearance: Normal appearance. He is not ill-appearing, toxic-appearing or diaphoretic.  HENT:     Head: Normocephalic and atraumatic.     Right Ear: External ear normal.     Left Ear: External ear normal.     Nose: Nose normal.     Mouth/Throat:     Mouth: Mucous membranes are moist.     Pharynx: Oropharynx is clear.  Eyes:     General: No scleral icterus.       Right eye: No discharge.        Left eye: No discharge.     Extraocular Movements: Extraocular movements intact.     Conjunctiva/sclera: Conjunctivae normal.     Pupils: Pupils are equal, round, and reactive to light.  Cardiovascular:     Rate and Rhythm: Normal rate and regular rhythm.     Pulses: Normal pulses.     Heart sounds: Normal heart sounds. No murmur heard.    No friction rub. No gallop.  Pulmonary:     Effort: Pulmonary effort is normal. No respiratory distress.  Breath sounds: Normal breath sounds. No stridor. No wheezing, rhonchi or rales.  Chest:     Chest wall: No tenderness.  Musculoskeletal:        General: Signs of injury (R wrist) present. Normal range of motion.     Cervical back: Normal range of motion and neck supple.     Comments: Tenderness tailbone  Skin:    General: Skin is warm and dry.     Capillary Refill: Capillary refill takes less than 2 seconds.     Coloration: Skin is not jaundiced or pale.     Findings: No bruising, erythema, lesion or rash.  Neurological:     General: No focal deficit present.     Mental Status: He is alert and oriented to person, place, and time. Mental status is at baseline.  Psychiatric:        Mood and Affect: Mood normal.        Behavior: Behavior normal.        Thought Content: Thought content normal.        Judgment: Judgment normal.      Results for orders placed or performed in visit on 08/04/23  Urine Culture   Collection Time: 08/04/23  3:07 PM   Specimen: Urine   Urine  Result Value Ref Range   Urine Culture, Routine Final report    Organism ID, Bacteria Comment   Microscopic Examination   Collection Time: 08/04/23  3:07 PM   Urine  Result Value Ref Range   WBC, UA 0-5 0 - 5 /hpf   RBC, Urine 0-2 0 - 2 /hpf   Epithelial Cells (non renal) 0-10 0 - 10 /hpf   Bacteria, UA Few (A) None seen/Few  Urinalysis, Routine w reflex microscopic   Collection Time: 08/04/23  3:07 PM  Result Value Ref Range   Specific Gravity, UA 1.025 1.005 - 1.030   pH, UA 7.0 5.0 - 7.5   Color, UA Yellow Yellow   Appearance Ur Clear Clear   Leukocytes,UA Negative Negative   Protein,UA 3+ (A) Negative/Trace   Glucose, UA Negative Negative   Ketones, UA Negative Negative   RBC, UA Trace (A) Negative   Bilirubin, UA Negative Negative   Urobilinogen, Ur 0.2 0.2 - 1.0 mg/dL   Nitrite, UA Negative Negative   Microscopic Examination See below:   Basic metabolic panel   Collection Time: 08/04/23  3:07 PM  Result Value Ref Range   Glucose 89 70 - 99 mg/dL   BUN 44 (H) 8 - 27 mg/dL   Creatinine, Ser 1.61 (H) 0.76 - 1.27 mg/dL   eGFR 13 (L) >09 UE/AVW/0.98   BUN/Creatinine Ratio 11 10 - 24   Sodium 141 134 - 144 mmol/L   Potassium 6.1 (H) 3.5 - 5.2 mmol/L   Chloride 104 96 - 106 mmol/L   CO2 24 20 - 29 mmol/L   Calcium 8.3 (L) 8.6 - 10.2 mg/dL   *Note: Due to a large number of results and/or encounters for the requested time period, some results have not been displayed. A complete set of results can be found in Results Review.      Assessment & Plan:   Problem List Items Addressed This Visit   None Visit Diagnoses       Sacral pain    -  Primary   Will treat with lidoderm and check x-ray. Await results. Treat as needed.   Relevant Orders   DG Sacrum/Coccyx     History of UTI  Rechecking urine today. Await results.  Treat as needed.   Relevant Orders   Urinalysis, Routine w reflex microscopic (Completed)   Urine Culture (Completed)     Pyuria       Will check culture. Await results.   Relevant Orders   Urine Culture (Completed)     AKI (acute kidney injury) (HCC)       Rechecking labs today. Await results. Treat as needed.   Relevant Orders   Basic metabolic panel (Completed)     Right wrist pain       Improving. Doesn't feel like he needs to go to ortho right now. Continue to monitor closely.        Follow up plan: Return in about 3 weeks (around 08/25/2023).  >82minutes spent with patient today

## 2023-08-04 NOTE — Telephone Encounter (Signed)
 Chief Complaint: recent fall- hurts to sit Symptoms: patient fell backwards 2 days ago- landed on buttock Frequency: 2 days Pertinent Negatives: Patient denies dizziness, swelling Disposition: [] ED /[] Urgent Care (no appt availability in office) / [x] Appointment(In office/virtual)/ []  [facility]/ [] Home Care/ [] Refused Recommended Disposition /[] [facility]/ []  Follow-up with PCP Additional Notes: Patient has been scheduled for evaluation due to 7/10 pain and age   Copied from CRM [id number]. Topic: Clinical - Red Word Triage >> Aug 04, 2023 11:29 AM Alessandra Bevels wrote: Red Word that prompted transfer to Nurse Triage: Micah calling from [facility] is calling to report that the patent is reporting a fall from 2 days ago. Report skin tears on both elbows, report that the patient did not hit his head. Tail bone pain 6/10. Unsure if the patient with go to UC for an xray. [phone] Reason for Disposition  [1] MODERATE pain (e.g., interferes with normal activities) AND [2] high-risk adult (e.g., age > 60 years, osteoporosis, chronic steroid use)  Answer Assessment - Initial Assessment Questions 1. MECHANISM: "How did the fall happen?"     2 days ago- patient not sure what happened- was on handicap ramp and fell backwards 2. DOMESTIC VIOLENCE AND ELDER ABUSE SCREENING: "Did you fall because someone pushed you or tried to hurt you?" If Yes, ask: "Are you safe now?"     *No Answer* 3. ONSET: "When did the fall happen?" (e.g., minutes, hours, or days ago)     *No Answer* 4. LOCATION: "What part of the body hit the ground?" (e.g., back, buttocks, head, hips, knees, hands, head, stomach)     Sore on tailbone, elbows scraped, hand was sore- R hand did have swelling 5. INJURY: "Did you hurt (injure) yourself when you fell?" If Yes, ask: "What did you injure? Tell me more about this?" (e.g., body area; type of injury; pain severity)"     *No Answer* 6. PAIN: "Is there any pain?"  If Yes, ask: "How bad is the pain?" (e.g., Scale 1-10; or mild,  moderate, severe)   - NONE (0): No pain   - MILD (1-3): Doesn't interfere with normal activities    - MODERATE (4-7): Interferes with normal activities or awakens from sleep    - SEVERE (8-10): Excruciating pain, unable to do any normal activities      Hard to sit comfortably- 7/10- when pressure on 7. SIZE: For cuts, bruises, or swelling, ask: "How large is it?" (e.g., inches or centimeters)      Scared skin elbows- "not bad"  9. OTHER SYMPTOMS: "Do you have any other symptoms?" (e.g., dizziness, fever, weakness; new onset or worsening).      no 10. CAUSE: "What do you think caused the fall (or falling)?" (e.g., tripped, dizzy spell)       Lost balance  Answer Assessment - Initial Assessment Questions 1. MECHANISM: "How did the injury happen?" (Consider the possibility of domestic violence or elder abuse)     Patient fell backwards and injury buttock 2. ONSET: "When did the injury happen?" (Minutes or hours ago)     2 days ago 3. LOCATION: "What part of the back is injured?"     Coccyx - tailbone 4. SEVERITY: "Can you move the back normally?"     Hurts to sit 5. PAIN: "Is there any pain?" If Yes, ask: "How bad is the pain?"   (Scale 1-10; or mild, moderate, severe)     7/10 6. CORD SYMPTOMS: Any weakness or  numbness of the arms or legs?"     no 7. SIZE: For cuts, bruises, or swelling, ask: "How large is it?" (e.g., inches or centimeters)     Scrapes to elbow and reports he did have swelling in hand- better now  Protocols used: Falls and Falling-A-AH, Back Injury-A-AH, Tailbone Injury-A-AH

## 2023-08-05 LAB — BASIC METABOLIC PANEL
BUN/Creatinine Ratio: 11 (ref 10–24)
BUN: 44 mg/dL — ABNORMAL HIGH (ref 8–27)
CO2: 24 mmol/L (ref 20–29)
Calcium: 8.3 mg/dL — ABNORMAL LOW (ref 8.6–10.2)
Chloride: 104 mmol/L (ref 96–106)
Creatinine, Ser: 4.17 mg/dL — ABNORMAL HIGH (ref 0.76–1.27)
Glucose: 89 mg/dL (ref 70–99)
Potassium: 6.1 mmol/L — ABNORMAL HIGH (ref 3.5–5.2)
Sodium: 141 mmol/L (ref 134–144)
eGFR: 13 mL/min/{1.73_m2} — ABNORMAL LOW (ref >59)

## 2023-08-06 LAB — URINE CULTURE

## 2023-08-07 ENCOUNTER — Ambulatory Visit: Payer: Medicare Other | Attending: Medical | Admitting: Medical

## 2023-08-07 ENCOUNTER — Ambulatory Visit: Payer: Medicare Other | Admitting: Family Medicine

## 2023-08-07 ENCOUNTER — Encounter: Payer: Self-pay | Admitting: Family Medicine

## 2023-08-07 ENCOUNTER — Other Ambulatory Visit: Payer: Self-pay

## 2023-08-07 ENCOUNTER — Other Ambulatory Visit: Payer: Self-pay | Admitting: Family Medicine

## 2023-08-07 DIAGNOSIS — R200 Anesthesia of skin: Secondary | ICD-10-CM | POA: Diagnosis not present

## 2023-08-07 DIAGNOSIS — E039 Hypothyroidism, unspecified: Secondary | ICD-10-CM

## 2023-08-07 LAB — SURGICAL PATHOLOGY

## 2023-08-07 MED ORDER — LEVOTHYROXINE SODIUM 75 MCG PO TABS: 75.0000 ug | ORAL_TABLET | Freq: Every day | ORAL | 1 refills | Status: DC

## 2023-08-07 NOTE — Progress Notes (Deleted)
  Cardiology Office Note:  .   Date:  08/07/2023  ID:  [name], DOB [birth year], MRN [id number] PCP: [name], DO  [facility] Providers Cardiologist:  Julien Nordmann, MD { }   History of Present Illness: .   [name] is a 83 y.o. 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   The patient was admitted 11/26-12/3/24 at [facility] for acute pericarditis/myocarditis, HFmrEF, Afib RVR, AKI and acute on chronic anemia. Cath showed relatively normal coronary arteries. Echo showed EF 40-45% with global HK. Limimted echo 12/1 showed EF 45-50%, small pericardial effusion. He was started on prednisone given AKI/CKD. ESR 127>>>74. Also had new dx of Afib with rates in the low 100s. He was started on Eliquis 2.5mg  BID. The patient self-diuresed.    He was admitted at [facility] 12/17-12/27/24 for pericardial effusion, pleural effusion, acute HF, slurred speech,AKI on CKD stage 4/nephrotic range proteinuria, typical aflutter and pericarditis.  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 Pt improved with high dose steroids and diuresis. He additionally had thoracentesis for exudative pleural effusion. He had attempt at cardioversion 12/19 with return to NSR but back into aflutter s/p DCCV for atrial flutter 12/26 with conversion to NSR with 1st degree AV block, started on amiodarone. MRI head 12/17 non-acute. He was discharged on lasix 40mg  daily. He was discharged to a rehab facility.  The patient was last seen 07/04/23 and was needing a kidney biopsy and knee replacements. H  ROS: ***  Studies Reviewed: .        *** Risk Assessment/Calculations:   {Does this patient have ATRIAL FIBRILLATION?[phone]} No BP recorded.  {Refresh Note OR Click here to enter BP  :1}***        Physical Exam:   VS:  There were no vitals taken for this visit.   Wt Readings from Last 3 Encounters:  08/03/23 169 lb 1.5 oz (76.7 kg)  07/26/23 169 lb (76.7 kg)  07/13/23 167 lb 8 oz (76 kg)    GEN: Well nourished, well developed in no acute distress NECK: No JVD; No carotid bruits CARDIAC: ***RRR, no murmurs, rubs, gallops RESPIRATORY:  Clear to auscultation without rales, wheezing or rhonchi  ABDOMEN: Soft, non-tender, non-distended EXTREMITIES:  No edema; No deformity   ASSESSMENT AND PLAN: .   ***    {Are you ordering a CV Procedure (e.g. stress test, cath, DCCV, TEE, etc)?   Press F2        :[phone]}  Dispo: ***  Signed, Ziva Nunziata David Stall, PA-C

## 2023-08-07 NOTE — Progress Notes (Signed)
   08/07/2023  Patient ID: [name], male   DOB: 1940-06-19, 83 y.o.   MRN: [id number]  Subjective/Objective Telephone visit to follow-up on medication management  Medication Management -Continue metoprolol xl 25mg  daily based on hypotension -Increase tamsulosin to 2 capsules daily based on urinary hesitancey- if this does not make a difference or lowers BP, decrease back to 1 capsule -Refills on file for duloxetine and simvastatin- contact [facility] to fill -Based on desire to reduce pill burden, can consider reducing use or stopping:  cyclobenzaprine, meclizine quetiapine based on symptoms/relief -CONTINUE all other maintenance medications as prescribed -[facility] does offer home delivery and packaging of medications, but they have yet to follow-up with me regarding wait list/pricing  Follow-up:  I will contact [facility] next week if I have not heard back re: adherence packaging and home delivery  Lenna Gilford, PharmD, DPLA

## 2023-08-08 ENCOUNTER — Encounter: Payer: Self-pay | Admitting: Family Medicine

## 2023-08-08 ENCOUNTER — Telehealth: Payer: Self-pay | Admitting: *Deleted

## 2023-08-08 DIAGNOSIS — I3139 Other pericardial effusion (noninflammatory): Secondary | ICD-10-CM | POA: Diagnosis not present

## 2023-08-08 DIAGNOSIS — B9620 Unspecified Escherichia coli [E. coli] as the cause of diseases classified elsewhere: Secondary | ICD-10-CM | POA: Diagnosis not present

## 2023-08-08 DIAGNOSIS — I130 Hypertensive heart and chronic kidney disease with heart failure and stage 1 through stage 4 chronic kidney disease, or unspecified chronic kidney disease: Secondary | ICD-10-CM | POA: Diagnosis not present

## 2023-08-08 DIAGNOSIS — N3001 Acute cystitis with hematuria: Secondary | ICD-10-CM | POA: Diagnosis not present

## 2023-08-08 DIAGNOSIS — G9341 Metabolic encephalopathy: Secondary | ICD-10-CM | POA: Diagnosis not present

## 2023-08-08 DIAGNOSIS — I4891 Unspecified atrial fibrillation: Secondary | ICD-10-CM | POA: Diagnosis not present

## 2023-08-08 LAB — URINALYSIS, ROUTINE W REFLEX MICROSCOPIC
Bilirubin, UA: NEGATIVE
Glucose, UA: NEGATIVE
Ketones, UA: NEGATIVE
Leukocytes,UA: NEGATIVE
Nitrite, UA: NEGATIVE
Specific Gravity, UA: 1.025 (ref 1.005–1.030)
Urobilinogen, Ur: 0.2 mg/dL (ref 0.2–1.0)
pH, UA: 7 (ref 5.0–7.5)

## 2023-08-08 LAB — MICROSCOPIC EXAMINATION

## 2023-08-08 NOTE — Telephone Encounter (Signed)
 RN placed call to [facility] to add on myeloid mutation panel per MD request from Dr. Smith Robert. Spoke with Joni Reining, test has been added on.

## 2023-08-08 NOTE — Telephone Encounter (Signed)
 He did not have x-ray done on Friday, it's still ordered. I do not know what a "nurse study" is- can we please clarify

## 2023-08-08 NOTE — Telephone Encounter (Signed)
 FYI to provider.   Copied from CRM [id number]. Topic: General - Other >> Aug 08, 2023  2:02 PM Santiya F wrote: Reason for CRM: Misty Stanley with [facility] is calling in because last Friday, patient went to get an Xray due to a prior fall, and fell again. Patient landed on his tailbone and has abrasions on his right elbow and the left side of his face near his eye. Patient was seen at the neurologist and had a nurse study done on him. Misty Stanley was calling to report this incident.

## 2023-08-09 ENCOUNTER — Ambulatory Visit: Payer: Medicare Other

## 2023-08-09 ENCOUNTER — Inpatient Hospital Stay: Payer: Medicare Other

## 2023-08-09 ENCOUNTER — Inpatient Hospital Stay: Payer: Medicare Other | Attending: Oncology

## 2023-08-09 ENCOUNTER — Other Ambulatory Visit: Payer: Medicare Other

## 2023-08-09 ENCOUNTER — Encounter (HOSPITAL_COMMUNITY): Payer: Self-pay | Admitting: Internal Medicine

## 2023-08-09 VITALS — BP 79/51

## 2023-08-09 DIAGNOSIS — D631 Anemia in chronic kidney disease: Secondary | ICD-10-CM | POA: Insufficient documentation

## 2023-08-09 DIAGNOSIS — D7581 Myelofibrosis: Secondary | ICD-10-CM

## 2023-08-09 DIAGNOSIS — M9903 Segmental and somatic dysfunction of lumbar region: Secondary | ICD-10-CM | POA: Diagnosis not present

## 2023-08-09 DIAGNOSIS — I129 Hypertensive chronic kidney disease with stage 1 through stage 4 chronic kidney disease, or unspecified chronic kidney disease: Secondary | ICD-10-CM | POA: Insufficient documentation

## 2023-08-09 DIAGNOSIS — M5412 Radiculopathy, cervical region: Secondary | ICD-10-CM | POA: Diagnosis not present

## 2023-08-09 DIAGNOSIS — M9901 Segmental and somatic dysfunction of cervical region: Secondary | ICD-10-CM | POA: Diagnosis not present

## 2023-08-09 DIAGNOSIS — M5033 Other cervical disc degeneration, cervicothoracic region: Secondary | ICD-10-CM | POA: Diagnosis not present

## 2023-08-09 DIAGNOSIS — N184 Chronic kidney disease, stage 4 (severe): Secondary | ICD-10-CM | POA: Insufficient documentation

## 2023-08-09 DIAGNOSIS — N281 Cyst of kidney, acquired: Secondary | ICD-10-CM | POA: Diagnosis not present

## 2023-08-09 DIAGNOSIS — N179 Acute kidney failure, unspecified: Secondary | ICD-10-CM | POA: Diagnosis not present

## 2023-08-09 DIAGNOSIS — N041 Nephrotic syndrome with focal and segmental glomerular lesions: Secondary | ICD-10-CM | POA: Diagnosis not present

## 2023-08-09 DIAGNOSIS — D509 Iron deficiency anemia, unspecified: Secondary | ICD-10-CM

## 2023-08-09 DIAGNOSIS — N183 Chronic kidney disease, stage 3 unspecified: Secondary | ICD-10-CM

## 2023-08-09 DIAGNOSIS — E875 Hyperkalemia: Secondary | ICD-10-CM | POA: Diagnosis not present

## 2023-08-09 LAB — HEMOGLOBIN AND HEMATOCRIT (CANCER CENTER ONLY)
HCT: 28.9 % — ABNORMAL LOW (ref 39.0–52.0)
Hemoglobin: 8.6 g/dL — ABNORMAL LOW (ref 13.0–17.0)

## 2023-08-09 MED ORDER — EPOETIN ALFA-EPBX 10000 UNIT/ML IJ SOLN
10000.0000 [IU] | Freq: Once | INTRAMUSCULAR | Status: AC
Start: 2023-08-09 — End: 2023-08-09
  Administered 2023-08-09: 10000 [IU] via SUBCUTANEOUS
  Filled 2023-08-09: qty 1

## 2023-08-09 MED ORDER — EPOETIN ALFA-EPBX 40000 UNIT/ML IJ SOLN
40000.0000 [IU] | Freq: Once | INTRAMUSCULAR | Status: AC
  Administered 2023-08-09: 40000 [IU] via SUBCUTANEOUS
  Filled 2023-08-09: qty 1

## 2023-08-09 NOTE — Progress Notes (Signed)
 BP obtained in office today is low with no reported symptoms.  Dr. Nessie Nong Robert notified, continued with Retacrit as scheduled and will send updated records to patient's nephrologist.

## 2023-08-09 NOTE — Telephone Encounter (Signed)
 Called and spoke with [place]. She states she was unclear herself as to the nurse study. She states that she questioned the patient with this and came to the conclusion that the patient was describing a nerve conduction study. Misty Stanley states that she was just calling to report the fall that the patient had.

## 2023-08-09 NOTE — Progress Notes (Signed)
 Patient had a follow up visit with his nephrologist today

## 2023-08-10 ENCOUNTER — Other Ambulatory Visit: Payer: Self-pay

## 2023-08-10 NOTE — Patient Outreach (Signed)
 Care Management  Transitions of Care Program Transitions of Care Post-discharge Program Completion    08/10/2023 Name: [name] MRN: [id number] DOB: 27-Sep-1940  Subjective: [name] is a 83 y.o. year old male who is a primary care patient of [name], DO. The Care Management team Engaged with patient Engaged with patient by telephone to assess and address transitions of care needs.   Consent to Services:  Patient was given information about care management services, agreed to services, and gave verbal consent to participate.   Assessment:   Completed call with spouse  Caregiver  voices no new complaints or concerns  and has not developed/ reported any new medical issues / Dx or acute changes. - since last follow-up call for most recent   Hospital stay    1/31-2/3/ 2025  No reported or reviewed Hospital Readmissions / No Urgent Care Visits / Transfers for Acute Change in condition .   MD/Specialist or Consultant visits reported since last follow-up call- He has a Bone Marrow Bx completed 2/27 Results available and follow-up visit is 08/15/23 PCP follow-up is 3/24  Per spouse Patient is  Alert & O x 4 ,responsive, able to voice needs,    There were no noted or reported cognitive/ mental status changes during this assessment General status reviewed  patient presents as expected for age and current situation with  finding as noted in previous sections of assessment  Patient denies changes in ADL function or IADLs  at this time. , He is currently at a continuing Education class for contractors and has several home projects he is completing He is feeling much better and started working PT Patient mood, expressions, emotional tone, insight, judgement  and thought content as expected with no notable or reported change No reports of new onset or increased / uncontrolled pain, has chronic pain managed with medicinal and non-medicinal interventions. He did have several falls recently  he was seen by PCP and had recc for Lidocaine Patches for sacral pain Per spouse he is not using these regularly  + changes in skin, no rashes, has 2 new wounds /  open areas - he sustained ST on both elbows  Diet reviewed and  education provided as needed to promote  healthy diet  Therapy Services  / PT  /  OT  with [facility]   Medication reconciliation/ review completed based on most recent medication list in EHR; and in review of recent Provider follow-up appointments- confirmed patient obtained / is taking all newly prescribed medications as instructed and is aware of any changes to previous medications regimen including dosage adjustments. His Synthroid was increased They had a call with Pharmacist for compliance packaging and request to reduce medications , finalization of these recc ae still pending Patient d not hear from [facility], spouse may follow-up herself and is waiting to hear back from Pharmacist   Reports  no new changes in medication/treatment  regimen  during this assessment period.  Changes noted-Synthroid  75 mcg every day and Flomax  o.4 mg 2 caps daily  Medications and current treatments are effective for use intended. No adverse Drug reactions  reported No changes or modifications indicated at this time.   Caregiver manages  medications Patient is able to take medications without difficulty;  denies questions/ concerns and reports no barriers to medication adherence at this time   Caregiver educated on red flag s/s to watch for based on current discharge diagnosis and was encouraged to  report, any changes in baseline or  medication regimen,   or any new unmanaged side effects or symptoms not relieved with interventions  to PCP and / or the  [facility] Case Management team .          SDOH Interventions    Flowsheet Row Care Coordination from 07/12/2023 in [facility] Care Coordination Patient Outreach from 07/11/2023 in [place] [facility]ATION [facility]ALTH  DEPARTMENT Telephone from 06/12/2023 in [place] POPULATION HEALTH DEPARTMENT Telephone from 04/25/2023 in [place] POPULATION HEALTH DEPARTMENT Office Visit from 11/02/2021 in [facility] Clinical Support from 07/12/2021 in [facility]  SDOH Interventions        Food Insecurity Interventions Intervention Not Indicated Intervention Not Indicated Intervention Not Indicated Intervention Not Indicated -- Intervention Not Indicated  Housing Interventions Intervention Not Indicated Intervention Not Indicated Intervention Not Indicated Intervention Not Indicated -- Intervention Not Indicated  Transportation Interventions Intervention Not Indicated, Patient Resources (Friends/Family), AMB Referral Intervention Not Indicated, Patient Resources (Friends/Family), AMB Referral Intervention Not Indicated, Patient Resources (Friends/Family) Intervention Not Indicated, Patient Resources (Friends/Family) -- Intervention Not Indicated  Utilities Interventions Intervention Not Indicated Intervention Not Indicated Intervention Not Indicated Intervention Not Indicated -- --  Depression Interventions/Treatment  -- -- -- -- Medication, Currently on Treatment --  Financial Strain Interventions -- -- -- -- -- Intervention Not Indicated  Physical Activity Interventions -- -- -- -- -- Intervention Not Indicated  Stress Interventions -- -- -- -- -- Intervention Not Indicated  Social Connections Interventions -- Intervention Not Indicated Intervention Not Indicated -- -- Intervention Not Indicated        Goals Addressed             This Visit's Progress    COMPLETED: TOC Care Plan       Current Barriers:  Chronic Disease Management support and education needs related to Atrial Fibrillation   RNCM Clinical Goal(s):  Patient will take all medications exactly as prescribed and will call provider for medication related questions as evidenced by no missed medication  doses  attend all scheduled medical appointments: with PCP and Cardiology  as evidenced by no missed follow-up visits- Cardiology  PCP pending  , Oncology Dr Smith Robert labs and  possible transfusions as needed   continue to work with RN Care Manager to address care management and care coordination needs related to  Atrial Fibrillation as evidenced by adherence to CM Team Scheduled appointments through collaboration with RN Care manager, provider, and care team.   Interventions: Evaluation of current treatment plan related to  self management and patient's adherence to plan as established by provider  Transitions of Care:  Goal Met. Durable Medical Equipment (DME) needs assessed with patient/caregiver, reviewed with patient/caregiver, and orders reviewed Doctor Visits  - discussed the importance of doctor visits Health /OT/PT Beverly Gust [phone] Post discharge activity limitations prescribed by provider reviewed  AFIB Interventions: (Status:  Goal Met.) Short Term Goal   Reviewed importance of adherence to anticoagulant exactly as prescribed Advised patient to discuss Medications  with provider [facility]I pharmacy consult consider blister packs once medications stabilize  Counseled on seeking medical attention after a head injury or if there is blood in the urine/stool Screening for signs and symptoms of depression related to chronic disease state  Assessed social determinant of health barriers  Patient Goals/Self-Care Activities: Participate in Transition of Care Program/Attend TOC scheduled calls Take all medications as prescribed Attend all scheduled provider appointments Call pharmacy for medication refills  3-7 days in advance of running out of medications Attend church or other social activities Perform all self care activities independently  Perform IADL's (shopping, preparing meals, housekeeping, managing finances) independently Call provider office for new concerns or questions   Follow  Up Plan:  The patient has been provided with contact information for the care management team and has been advised to call with any health related questions or concerns.  No further follow up required: He has completed 30 day Program           Plan:   The patient has successfully completed the 30-day TOC Program. Condition is stable No further acute needs identified at this time. Chronic conditions and ongoing care is  managed thru collaboration with  PCP,  Specialists and additional Healthcare Providers if indicated . Patient verbalized understanding of ongoing plan of care.  SDOH needs have been screened and interventions provided if identified.   Reviewed current home medications -- provided education as needed.  Previously discussed rationale of use, how/when to take medications. Patient is aware of potential side effects, and was encouraged to notify PCP for any changes in condition or signs / symptoms not relieved  with interventions.    Patient will call 911 for Medical Emergencies or Life -Threatening or report to a local emergency department or urgent care.    Patient was encouraged to Contact PCP  with any questions or concerns regarding ongoing  medical care, any  difficulty obtaining or picking up  prescriptions, any  changes or  worsening in  condition including signs / symptoms not relieved  with interventions Follow up as indicated with the  Care Team , or sooner should any new problems arise.   Patient's spouse  had no additional questions or concerns at this time. Current needs addressed.    The patient has been provided with contact information for the care management team and has been advised to call with any health related questions or concerns.   Susa Loffler , BSN, RN [facility]   VBCI-Population Health RN Care Manager Direct Dial [phone]  Fax: [phone] Website: [name]

## 2023-08-10 NOTE — Patient Instructions (Signed)
 Visit Information  Thank you for taking time to visit with me today. Please don't hesitate to contact me if I can be of assistance to you    The patient has successfully completed the 30-day TOC Program. Condition is stable No further acute needs identified at this time. Chronic conditions and ongoing care is  managed thru collaboration with  PCP,  Specialists and additional Healthcare Providers if indicated . Patient verbalized understanding of ongoing plan of care.  SDOH needs have been screened and interventions provided if identified.      Patient had no additional questions or concerns at this time. Current needs addressed.    Following is a copy of your care plan:   Goals Addressed             This Visit's Progress    COMPLETED: TOC Care Plan       Current Barriers:  Chronic Disease Management support and education needs related to Atrial Fibrillation   RNCM Clinical Goal(s):  Patient will take all medications exactly as prescribed and will call provider for medication related questions as evidenced by no missed medication doses  attend all scheduled medical appointments: with PCP and Cardiology  as evidenced by no missed follow-up visits- Cardiology  PCP pending  , Oncology Dr Smith Robert labs and  possible transfusions as needed   continue to work with RN Care Manager to address care management and care coordination needs related to  Atrial Fibrillation as evidenced by adherence to CM Team Scheduled appointments through collaboration with RN Care manager, provider, and care team.   Interventions: Evaluation of current treatment plan related to  self management and patient's adherence to plan as established by provider  Transitions of Care:  Goal Met. Durable Medical Equipment (DME) needs assessed with patient/caregiver, reviewed with patient/caregiver, and orders reviewed Doctor Visits  - discussed the importance of doctor visits Health /OT/PT Beverly Gust [phone] Post discharge  activity limitations prescribed by provider reviewed  AFIB Interventions: (Status:  Goal Met.) Short Term Goal   Reviewed importance of adherence to anticoagulant exactly as prescribed Advised patient to discuss Medications  with provider [facility]I pharmacy consult consider blister packs once medications stabilize  Counseled on seeking medical attention after a head injury or if there is blood in the urine/stool Screening for signs and symptoms of depression related to chronic disease state  Assessed social determinant of health barriers  Patient Goals/Self-Care Activities: Participate in Transition of Care Program/Attend TOC scheduled calls Take all medications as prescribed Attend all scheduled provider appointments Call pharmacy for medication refills 3-7 days in advance of running out of medications Attend church or other social activities Perform all self care activities independently  Perform IADL's (shopping, preparing meals, housekeeping, managing finances) independently Call provider office for new concerns or questions   Follow Up Plan:  The patient has been provided with contact information for the care management team and has been advised to call with any health related questions or concerns.  No further follow up required: He has completed 30 day Program           Medication review  Reviewed current home medications -- provided education as needed. Patient is aware of potential side effects and was encouraged to notify PCP for any adverse side effects or unwanted symptoms not relieved with interventions  Patient will call 911 for Medical Emergencies or Life -Threatening Symptoms.  Reviewed goals for care Patient/ Caregiver verbalizes understanding of instructions with the plan of care . The  Patient / Caregiver was encouraged to make informed decisions about care, actively participate in managing health conditions, and implement lifestyle changes as needed to promote  independence and self-management of healthcare. SDOH screenings have been completed and addressed if indicted.  There are no reported barriers to care.    Follow-up Plan [facility]BCI Case Management Nurse will provide follow-up and on-going assessment ,evaluation and education of disease processes, recommended interventions for both chronic and acute medical conditions ,  along with ongoing review of symptoms ,medication reviews / reconciliation during each weekly call . Any updates , inconsistencies, discrepancies or acute care concerns will be addressed and routed to the correct Practitioner if indicated   The patient has been provided with contact information for the care management team and has been advised to call with any health-related questions or concerns. Follow up call  with Care Team  as scheduled,or sooner should any new problems arise.  [facility]  Please call the care guide team at [phone]  if you need to cancel or reschedule your appointment . For scheduled calls -Three attempts will be made to reach you -if the scheduled call is missed or  we are unable to reach the you after 3 attempts no additional outreach attempts will be made and the TOC follow-up will be closed .   If you need to speak to a Nurse you may  call me directly at the number below or if I am unavailable,and  your need is urgent  please call the main [facility]I number at [phone] and ask to speak with one of the [facility] ( Transition of Care )  Nurses  .  Patient was encouraged to Contact PCP with any changes in baseline or  medication regimen,  changes in health status  /  well-being, safety concerns, including falls any questions or concerns regarding ongoing medical care, any difficulty obtaining or picking up prescriptions, any changes or worsening in condition- including  symptoms not relieved  with interventions                                                                            Additionally, If you  experience worsening of your symptoms, develop shortness of breath, If you are experiencing a medical emergency,  develop suicidal or homicidal thoughts you must seek medical attention immediately by calling 911 or report to your local emergency department or urgent care.   If you have a non-emergency medical problem during routine business hours, please contact your provider's office and ask to speak with a nurse.       Please take the time to read instructions/literature along with the possible adverse reactions/side effects for all the Medicines that have been prescribed to you. Only take newly prescribed  Medications after you have completely understood and accept all the possible adverse reactions/side effects.   Do not take more than prescribed Medications for  Pain, Sleep and Anxiety. Do not drive when taking Pain medications or sleep aid/ insomnia  medications It is not advisable to combine anxiety, sleep and pain medications without talking with your primary care practitioner    If you are experiencing a Mental Health or Behavioral Health Crisis or need someone to  talk to Please call the Suicide and Crisis Lifeline: 988 You may also call the [place] National Suicide Prevention Lifeline: (971)776-5867 or TTY: 9861183833 TTY 863-824-4259) to talk to a trained counselor.  You may call the Behavioral Health Crisis Line at 863-786-7344, at any time, 24 hours a day, 7 days a week- however If you are in danger or need immediate medical attention, call 911.   If you would like help to quit smoking, call 1-800-QUIT-NOW ( 339 024 7311) OR Espaol: 1-855-Djelo-Ya (4-166-063-0160) o para ms informacin haga clic aqu or Text READY to 109-323 to register via text.   Susa Loffler , BSN, RN [place]   VBCI-Population Health RN Care Manager Direct Dial [phone]  Website: [name]

## 2023-08-14 DIAGNOSIS — N281 Cyst of kidney, acquired: Secondary | ICD-10-CM | POA: Diagnosis not present

## 2023-08-14 DIAGNOSIS — D631 Anemia in chronic kidney disease: Secondary | ICD-10-CM | POA: Diagnosis not present

## 2023-08-14 DIAGNOSIS — N184 Chronic kidney disease, stage 4 (severe): Secondary | ICD-10-CM | POA: Diagnosis not present

## 2023-08-14 DIAGNOSIS — N041 Nephrotic syndrome with focal and segmental glomerular lesions: Secondary | ICD-10-CM | POA: Diagnosis not present

## 2023-08-14 DIAGNOSIS — E875 Hyperkalemia: Secondary | ICD-10-CM | POA: Diagnosis not present

## 2023-08-15 ENCOUNTER — Other Ambulatory Visit: Payer: Self-pay | Admitting: Family Medicine

## 2023-08-15 ENCOUNTER — Encounter: Payer: Self-pay | Admitting: Family Medicine

## 2023-08-15 ENCOUNTER — Encounter (HOSPITAL_COMMUNITY): Payer: Self-pay | Admitting: Internal Medicine

## 2023-08-15 DIAGNOSIS — N179 Acute kidney failure, unspecified: Secondary | ICD-10-CM

## 2023-08-15 NOTE — Telephone Encounter (Signed)
 Requested medication (s) are due for refill today: Yes  Requested medication (s) are on the active medication list: Yes  Last refill:  07/07/23  Future visit scheduled: Yes  Notes to clinic:  Unable to refill per protocol, cannot delegate.      Requested Prescriptions  Pending Prescriptions Disp Refills   cyclobenzaprine (FLEXERIL) 10 MG tablet [Pharmacy Med Name: CYCLOBENZAPRINE HCL 10 MG TAB] 30 tablet 0    Sig: TAKE ONE TABLET BY MOUTH AT BEDTIME     Not Delegated - Analgesics:  Muscle Relaxants Failed - 08/15/2023  4:14 PM      Failed - This refill cannot be delegated      Passed - Valid encounter within last 6 months    Recent Outpatient Visits           1 month ago Other chest pain   [place] [facility] [place], Megan P, DO   1 month ago Right wrist pain   [place] [facility] [place], Megan P, DO   2 months ago Atrial fibrillation with rapid ventricular response [facility])   [place] [facility] [place], Megan P, DO   4 months ago Vertigo   [place] [facility] [place], Megan P, DO   5 months ago Diarrhea, unspecified type   [place] [facility] [name], DO       Future Appointments             In 1 week [name], DO [facility]   In 1 month Deirdre Evener, MD [facility]

## 2023-08-15 NOTE — Progress Notes (Signed)
 1st attempt made to reach patient and discuss results. Will try again on next business day. Ok for E2C2 to review results if call is returned.

## 2023-08-16 ENCOUNTER — Inpatient Hospital Stay: Payer: Medicare Other

## 2023-08-16 ENCOUNTER — Other Ambulatory Visit

## 2023-08-16 VITALS — BP 125/57

## 2023-08-16 DIAGNOSIS — D631 Anemia in chronic kidney disease: Secondary | ICD-10-CM | POA: Diagnosis not present

## 2023-08-16 DIAGNOSIS — D509 Iron deficiency anemia, unspecified: Secondary | ICD-10-CM

## 2023-08-16 DIAGNOSIS — D7581 Myelofibrosis: Secondary | ICD-10-CM

## 2023-08-16 DIAGNOSIS — N179 Acute kidney failure, unspecified: Secondary | ICD-10-CM | POA: Diagnosis not present

## 2023-08-16 DIAGNOSIS — N183 Chronic kidney disease, stage 3 unspecified: Secondary | ICD-10-CM

## 2023-08-16 DIAGNOSIS — E039 Hypothyroidism, unspecified: Secondary | ICD-10-CM

## 2023-08-16 DIAGNOSIS — M9901 Segmental and somatic dysfunction of cervical region: Secondary | ICD-10-CM | POA: Diagnosis not present

## 2023-08-16 DIAGNOSIS — N184 Chronic kidney disease, stage 4 (severe): Secondary | ICD-10-CM | POA: Diagnosis not present

## 2023-08-16 DIAGNOSIS — M5412 Radiculopathy, cervical region: Secondary | ICD-10-CM | POA: Diagnosis not present

## 2023-08-16 DIAGNOSIS — M9903 Segmental and somatic dysfunction of lumbar region: Secondary | ICD-10-CM | POA: Diagnosis not present

## 2023-08-16 DIAGNOSIS — M5033 Other cervical disc degeneration, cervicothoracic region: Secondary | ICD-10-CM | POA: Diagnosis not present

## 2023-08-16 DIAGNOSIS — I129 Hypertensive chronic kidney disease with stage 1 through stage 4 chronic kidney disease, or unspecified chronic kidney disease: Secondary | ICD-10-CM | POA: Diagnosis not present

## 2023-08-16 LAB — HEMOGLOBIN AND HEMATOCRIT (CANCER CENTER ONLY)
HCT: 28.7 % — ABNORMAL LOW (ref 39.0–52.0)
Hemoglobin: 8.5 g/dL — ABNORMAL LOW (ref 13.0–17.0)

## 2023-08-16 MED ORDER — EPOETIN ALFA-EPBX 40000 UNIT/ML IJ SOLN
40000.0000 [IU] | Freq: Once | INTRAMUSCULAR | Status: AC
  Administered 2023-08-16: 40000 [IU] via SUBCUTANEOUS
  Filled 2023-08-16: qty 1

## 2023-08-16 MED ORDER — EPOETIN ALFA-EPBX 10000 UNIT/ML IJ SOLN
10000.0000 [IU] | Freq: Once | INTRAMUSCULAR | Status: AC
  Administered 2023-08-16: 10000 [IU] via SUBCUTANEOUS
  Filled 2023-08-16: qty 1

## 2023-08-17 LAB — BASIC METABOLIC PANEL
BUN/Creatinine Ratio: 17 (ref 10–24)
BUN: 53 mg/dL — ABNORMAL HIGH (ref 8–27)
CO2: 22 mmol/L (ref 20–29)
Calcium: 8.3 mg/dL — ABNORMAL LOW (ref 8.6–10.2)
Chloride: 108 mmol/L — ABNORMAL HIGH (ref 96–106)
Creatinine, Ser: 3.04 mg/dL — ABNORMAL HIGH (ref 0.76–1.27)
Glucose: 101 mg/dL — ABNORMAL HIGH (ref 70–99)
Potassium: 5.8 mmol/L — ABNORMAL HIGH (ref 3.5–5.2)
Sodium: 142 mmol/L (ref 134–144)
eGFR: 20 mL/min/{1.73_m2} — ABNORMAL LOW (ref >59)

## 2023-08-17 LAB — TSH: TSH: 34.9 u[IU]/mL — ABNORMAL HIGH (ref 0.450–4.500)

## 2023-08-21 DIAGNOSIS — D631 Anemia in chronic kidney disease: Secondary | ICD-10-CM | POA: Diagnosis not present

## 2023-08-21 DIAGNOSIS — N281 Cyst of kidney, acquired: Secondary | ICD-10-CM | POA: Diagnosis not present

## 2023-08-21 DIAGNOSIS — N184 Chronic kidney disease, stage 4 (severe): Secondary | ICD-10-CM | POA: Diagnosis not present

## 2023-08-21 DIAGNOSIS — E875 Hyperkalemia: Secondary | ICD-10-CM | POA: Diagnosis not present

## 2023-08-22 ENCOUNTER — Other Ambulatory Visit: Payer: Self-pay

## 2023-08-23 ENCOUNTER — Other Ambulatory Visit: Payer: Medicare Other

## 2023-08-23 ENCOUNTER — Ambulatory Visit: Payer: Medicare Other

## 2023-08-23 ENCOUNTER — Ambulatory Visit: Payer: Medicare Other | Admitting: Oncology

## 2023-08-23 ENCOUNTER — Inpatient Hospital Stay: Payer: Medicare Other

## 2023-08-23 VITALS — BP 107/65

## 2023-08-23 DIAGNOSIS — D631 Anemia in chronic kidney disease: Secondary | ICD-10-CM | POA: Diagnosis not present

## 2023-08-23 DIAGNOSIS — D7581 Myelofibrosis: Secondary | ICD-10-CM

## 2023-08-23 DIAGNOSIS — M5033 Other cervical disc degeneration, cervicothoracic region: Secondary | ICD-10-CM | POA: Diagnosis not present

## 2023-08-23 DIAGNOSIS — I129 Hypertensive chronic kidney disease with stage 1 through stage 4 chronic kidney disease, or unspecified chronic kidney disease: Secondary | ICD-10-CM | POA: Diagnosis not present

## 2023-08-23 DIAGNOSIS — D509 Iron deficiency anemia, unspecified: Secondary | ICD-10-CM

## 2023-08-23 DIAGNOSIS — M9901 Segmental and somatic dysfunction of cervical region: Secondary | ICD-10-CM | POA: Diagnosis not present

## 2023-08-23 DIAGNOSIS — M9903 Segmental and somatic dysfunction of lumbar region: Secondary | ICD-10-CM | POA: Diagnosis not present

## 2023-08-23 DIAGNOSIS — M5412 Radiculopathy, cervical region: Secondary | ICD-10-CM | POA: Diagnosis not present

## 2023-08-23 DIAGNOSIS — N184 Chronic kidney disease, stage 4 (severe): Secondary | ICD-10-CM | POA: Diagnosis not present

## 2023-08-23 LAB — CBC WITH DIFFERENTIAL (CANCER CENTER ONLY)
Abs Immature Granulocytes: 0.53 10*3/uL — ABNORMAL HIGH (ref 0.00–0.07)
Basophils Absolute: 0 10*3/uL (ref 0.0–0.1)
Basophils Relative: 1 %
Eosinophils Absolute: 0 10*3/uL (ref 0.0–0.5)
Eosinophils Relative: 0 %
HCT: 31.7 % — ABNORMAL LOW (ref 39.0–52.0)
Hemoglobin: 9.2 g/dL — ABNORMAL LOW (ref 13.0–17.0)
Immature Granulocytes: 8 %
Lymphocytes Relative: 14 %
Lymphs Abs: 0.9 10*3/uL (ref 0.7–4.0)
MCH: 28.2 pg (ref 26.0–34.0)
MCHC: 29 g/dL — ABNORMAL LOW (ref 30.0–36.0)
MCV: 97.2 fL (ref 80.0–100.0)
Monocytes Absolute: 1 10*3/uL (ref 0.1–1.0)
Monocytes Relative: 15 %
Neutro Abs: 4.1 10*3/uL (ref 1.7–7.7)
Neutrophils Relative %: 62 %
Platelet Count: 314 10*3/uL (ref 150–400)
RBC: 3.26 MIL/uL — ABNORMAL LOW (ref 4.22–5.81)
RDW: 20.7 % — ABNORMAL HIGH (ref 11.5–15.5)
Smear Review: NORMAL
WBC Count: 6.6 10*3/uL (ref 4.0–10.5)
nRBC: 1.8 % — ABNORMAL HIGH (ref 0.0–0.2)

## 2023-08-23 LAB — IRON AND TIBC
Iron: 53 ug/dL (ref 45–182)
Saturation Ratios: 26 % (ref 17.9–39.5)
TIBC: 203 ug/dL — ABNORMAL LOW (ref 250–450)
UIBC: 150 ug/dL

## 2023-08-23 LAB — FERRITIN: Ferritin: 583 ng/mL — ABNORMAL HIGH (ref 24–336)

## 2023-08-23 MED ORDER — EPOETIN ALFA-EPBX 10000 UNIT/ML IJ SOLN
10000.0000 [IU] | Freq: Once | INTRAMUSCULAR | Status: AC
  Administered 2023-08-23: 10000 [IU] via SUBCUTANEOUS
  Filled 2023-08-23: qty 1

## 2023-08-23 MED ORDER — EPOETIN ALFA-EPBX 40000 UNIT/ML IJ SOLN
40000.0000 [IU] | Freq: Once | INTRAMUSCULAR | Status: AC
  Administered 2023-08-23: 40000 [IU] via SUBCUTANEOUS
  Filled 2023-08-23: qty 1

## 2023-08-25 ENCOUNTER — Encounter: Payer: Self-pay | Admitting: Family Medicine

## 2023-08-25 ENCOUNTER — Other Ambulatory Visit: Payer: Self-pay | Admitting: Family Medicine

## 2023-08-25 MED ORDER — LEVOTHYROXINE SODIUM 75 MCG PO TABS: 75.0000 ug | ORAL_TABLET | Freq: Every day | ORAL | 0 refills | Status: DC

## 2023-08-26 ENCOUNTER — Other Ambulatory Visit: Payer: Self-pay | Admitting: Family Medicine

## 2023-08-28 ENCOUNTER — Ambulatory Visit (INDEPENDENT_AMBULATORY_CARE_PROVIDER_SITE_OTHER): Payer: Medicare Other | Admitting: Family Medicine

## 2023-08-28 ENCOUNTER — Encounter: Payer: Self-pay | Admitting: Family Medicine

## 2023-08-28 VITALS — BP 118/68 | HR 47 | Temp 98.0°F | Wt 166.4 lb

## 2023-08-28 DIAGNOSIS — R7989 Other specified abnormal findings of blood chemistry: Secondary | ICD-10-CM

## 2023-08-28 DIAGNOSIS — E782 Mixed hyperlipidemia: Secondary | ICD-10-CM

## 2023-08-28 DIAGNOSIS — E039 Hypothyroidism, unspecified: Secondary | ICD-10-CM | POA: Diagnosis not present

## 2023-08-28 DIAGNOSIS — I129 Hypertensive chronic kidney disease with stage 1 through stage 4 chronic kidney disease, or unspecified chronic kidney disease: Secondary | ICD-10-CM | POA: Diagnosis not present

## 2023-08-28 MED ORDER — LEVOTHYROXINE SODIUM 100 MCG PO TABS: 100.0000 ug | ORAL_TABLET | Freq: Every day | ORAL | 0 refills | Status: DC

## 2023-08-28 NOTE — Telephone Encounter (Signed)
 Requested medication (s) are due for refill today:   Requested medication (s) are on the active medication list: Yes  Last refill:    Future visit scheduled: No  Notes to clinic:  Medications not delegated.    Requested Prescriptions  Pending Prescriptions Disp Refills   cyclobenzaprine (FLEXERIL) 10 MG tablet [Pharmacy Med Name: CYCLOBENZAPRINE HCL 10 MG TAB] 30 tablet 0    Sig: TAKE ONE TABLET BY MOUTH AT BEDTIME     Not Delegated - Analgesics:  Muscle Relaxants Failed - 08/28/2023  3:07 PM      Failed - This refill cannot be delegated      Passed - Valid encounter within last 6 months    Recent Outpatient Visits           1 month ago Other chest pain   [facility] [place], Megan P, DO   1 month ago Right wrist pain   [facility] [place], Megan P, DO   2 months ago Atrial fibrillation with rapid ventricular response (HCC)   [facility] [facility] [place], Megan P, DO   4 months ago Vertigo   [place] [facility] [place], Megan P, DO   6 months ago Diarrhea, unspecified type   [place] [facility] [place], Oralia Rud, DO       Future Appointments             In 2 months Deirdre Evener, MD [facility]             QUEtiapine (SEROQUEL) 25 MG tablet [Pharmacy Med Name: QUETIAPINE FUMARATE 25 MG TAB] 30 tablet 0    Sig: TAKE ONE TABLET BY MOUTH AT BEDTIME     Not Delegated - Psychiatry:  Antipsychotics - Second Generation (Atypical) - quetiapine Failed - 08/28/2023  3:07 PM      Failed - This refill cannot be delegated      Failed - TSH in normal range and within 360 days    TSH  Date Value Ref Range Status  08/16/2023 34.900 (H) 0.450 - 4.500 uIU/mL Final         Failed - Lipid Panel in normal range within the last 12 months    Cholesterol, Total  Date Value Ref Range Status  09/13/2022 102 100 - 199 mg/dL Final   Cholesterol  Date Value  Ref Range Status  05/02/2023 84 0 - 200 mg/dL Final   Cholesterol Piccolo, Waived  Date Value Ref Range Status  04/14/2016 106 <200 mg/dL Final    Comment:                            Desirable                <200                         Borderline High      200- 239                         High                     >239    LDL Chol Calc (NIH)  Date Value Ref Range Status  09/13/2022 49 0 - 99 mg/dL Final   LDL Cholesterol  Date Value Ref Range  Status  05/02/2023 47 0 - 99 mg/dL Final    Comment:           Total Cholesterol/HDL:CHD Risk Coronary Heart Disease Risk Table                     Men   Women  1/2 Average Risk   3.4   3.3  Average Risk       5.0   4.4  2 X Average Risk   9.6   7.1  3 X Average Risk  23.4   11.0        Use the calculated Patient Ratio above and the CHD Risk Table to determine the patient's CHD Risk.        ATP III CLASSIFICATION (LDL):  <100     mg/dL   Optimal  161-096  mg/dL   Near or Above                    Optimal  130-159  mg/dL   Borderline  045-409  mg/dL   High  >811     mg/dL   Very High Performed at [facility], [address] [place], [place] [address]    HDL  Date Value Ref Range Status  05/02/2023 23 (L) >40 mg/dL Final  29/56/2130 37 (L) >39 mg/dL Final   Triglycerides  Date Value Ref Range Status  05/02/2023 68 <150 mg/dL Final   Triglycerides Piccolo,Waived  Date Value Ref Range Status  04/14/2016 99 <150 mg/dL Final    Comment:                            Normal                   <150                         Borderline High     150 - 199                         High                200 - 499                         Very High                >499          Passed - Last BP in normal range    BP Readings from Last 1 Encounters:  08/23/23 107/65         Passed - Last Heart Rate in normal range    Pulse Readings from Last 1 Encounters:  08/04/23 89         Passed - Valid encounter within last 6  months    Recent Outpatient Visits           1 month ago Other chest pain   [place] [facility] [place], [place] P, DO   1 month ago Right wrist pain   [facility] [place], Megan P, DO   2 months ago Atrial fibrillation with rapid ventricular response [facility])   [place] [facility] [place], Megan P, DO   4 months ago Vertigo   [facility] [place], [place], [place]  6 months ago Diarrhea, unspecified type   [facility] [name], DO       Future Appointments             In 2 months Deirdre Evener, MD [facility]            Passed - CBC within normal limits and completed in the last 12 months    WBC  Date Value Ref Range Status  08/03/2023 4.9 4.0 - 10.5 K/uL Final   WBC Count  Date Value Ref Range Status  08/23/2023 6.6 4.0 - 10.5 K/uL Final   RBC  Date Value Ref Range Status  08/23/2023 3.26 (L) 4.22 - 5.81 MIL/uL Final   Hemoglobin  Date Value Ref Range Status  08/23/2023 9.2 (L) 13.0 - 17.0 g/dL Final  16/03/9603 6.9 (LL) 13.0 - 17.7 g/dL Final    Comment:                      Client Requested Flag   HCT  Date Value Ref Range Status  08/23/2023 31.7 (L) 39.0 - 52.0 % Final   Hematocrit  Date Value Ref Range Status  07/04/2023 21.6 (L) 37.5 - 51.0 % Final   MCHC  Date Value Ref Range Status  08/23/2023 29.0 (L) 30.0 - 36.0 g/dL Final   [facility]  Date Value Ref Range Status  08/23/2023 28.2 26.0 - 34.0 pg Final   MCV  Date Value Ref Range Status  08/23/2023 97.2 80.0 - 100.0 fL Final  07/04/2023 90 79 - 97 fL Final  06/18/2014 90 80 - 100 fL Final   No results found for: "PLTCOUNTKUC", "LABPLAT", "POCPLA" RDW  Date Value Ref Range Status  08/23/2023 20.7 (H) 11.5 - 15.5 % Final  07/04/2023 16.9 (H) 11.6 - 15.4 % Final  06/18/2014 18.8 (H) 11.5 - 14.5 % Final         Passed - CMP within normal limits and completed in  the last 12 months    Albumin  Date Value Ref Range Status  06/29/2023 2.9 (L) 3.5 - 5.0 g/dL Final  54/02/8118 3.9 3.7 - 4.7 g/dL Final  14/78/2956 3.9 3.4 - 5.0 g/dL Final   Alkaline Phosphatase  Date Value Ref Range Status  06/29/2023 67 38 - 126 U/L Final  06/18/2014 118 (H) Unit/L Final    Comment:    46-116 NOTE: New Reference Range 12/24/13    ALT  Date Value Ref Range Status  06/29/2023 22 0 - 44 U/L Final  10/19/2022 21 0 - 44 U/L Final   SGPT (ALT)  Date Value Ref Range Status  06/18/2014 43 U/L Final    Comment:    14-63 NOTE: New Reference Range 12/24/13    AST  Date Value Ref Range Status  06/29/2023 15 15 - 41 U/L Final  10/19/2022 24 15 - 41 U/L Final   BUN  Date Value Ref Range Status  08/16/2023 53 (H) 8 - 27 mg/dL Final  21/30/8657 20 (H) 7 - 18 mg/dL Final   Calcium  Date Value Ref Range Status  08/16/2023 8.3 (L) 8.6 - 10.2 mg/dL Final   Calcium, Total  Date Value Ref Range Status  06/18/2014 8.5 8.5 - 10.1 mg/dL Final   Calcium, Ion  Date Value Ref Range Status  04/12/2022 1.07 (L) 1.15 - 1.40 mmol/L Final   CO2  Date Value Ref Range Status  08/16/2023 22 20 - 29  mmol/L Final   Co2  Date Value Ref Range Status  06/18/2014 26 21 - 32 mmol/L Final   TCO2  Date Value Ref Range Status  04/12/2022 21 (L) 22 - 32 mmol/L Final   Creatinine  Date Value Ref Range Status  10/19/2022 2.08 (H) 0.61 - 1.24 mg/dL Final  44/08/4740 5.95 0.60 - 1.30 mg/dL Final   Creatinine, Ser  Date Value Ref Range Status  08/16/2023 3.04 (H) 0.76 - 1.27 mg/dL Final   Glucose  Date Value Ref Range Status  08/16/2023 101 (H) 70 - 99 mg/dL Final  63/87/5643 329 (H) 65 - 99 mg/dL Final   Glucose, Bld  Date Value Ref Range Status  06/29/2023 102 (H) 70 - 99 mg/dL Final    Comment:    Glucose reference range applies only to samples taken after fasting for at least 8 hours.   Glucose-Capillary  Date Value Ref Range Status  05/09/2023 150 (H) 70 -  99 mg/dL Final    Comment:    Glucose reference range applies only to samples taken after fasting for at least 8 hours.   Potassium  Date Value Ref Range Status  08/16/2023 5.8 (H) 3.5 - 5.2 mmol/L Final  06/18/2014 4.5 3.5 - 5.1 mmol/L Final   Sodium  Date Value Ref Range Status  08/16/2023 142 134 - 144 mmol/L Final  06/18/2014 141 136 - 145 mmol/L Final   Total Bilirubin  Date Value Ref Range Status  06/29/2023 0.7 0.0 - 1.2 mg/dL Final  51/88/4166 0.6 0.3 - 1.2 mg/dL Final   Protein, ur  Date Value Ref Range Status  05/02/2023 >=300 (A) NEGATIVE mg/dL Final   Protein,UA  Date Value Ref Range Status  08/04/2023 3+ (A) Negative/Trace Final   Total Protein  Date Value Ref Range Status  06/29/2023 5.4 (L) 6.5 - 8.1 g/dL Final  12/04/1599 5.4 (L) 6.0 - 8.5 g/dL Final  09/32/3557 6.6 6.4 - 8.2 g/dL Final   EGFR (African American)  Date Value Ref Range Status  06/18/2014 >60 >39mL/min Final   GFR calc Af Amer  Date Value Ref Range Status  05/25/2020 55 (L) >59 mL/min/1.73 Final    Comment:    **In accordance with recommendations from the NKF-ASN Task force,**   [facility] is in the process of updating its eGFR calculation to the   2021 CKD-EPI creatinine equation that estimates kidney function   without a race variable.    eGFR  Date Value Ref Range Status  08/16/2023 20 (L) >59 mL/min/1.73 Final   EGFR (Non-African Amer.)  Date Value Ref Range Status  06/18/2014 >60 >81mL/min Final    Comment:    eGFR values <61mL/min/1.73 m2 may be an indication of chronic kidney disease (CKD). Calculated eGFR, using the MRDR Study equation, is useful in  patients with stable renal function. The eGFR calculation will not be reliable in acutely ill patients when serum creatinine is changing rapidly. It is not useful in patients on dialysis. The eGFR calculation may not be applicable to patients at the low and high extremes of body sizes, pregnant women, and vegetarians.     GFR, Estimated  Date Value Ref Range Status  06/29/2023 20 (L) >60 mL/min Final    Comment:    (NOTE) Calculated using the CKD-EPI Creatinine Equation (2021)   10/19/2022 31 (L) >60 mL/min Final    Comment:    (NOTE) Calculated using the CKD-EPI Creatinine Equation (2021)

## 2023-08-28 NOTE — Assessment & Plan Note (Signed)
 Under good control on current regimen. Continue current regimen. Continue to monitor. Call with any concerns. Refills up to date. Recheck labs in 6 weeks.

## 2023-08-28 NOTE — Assessment & Plan Note (Signed)
 Undertreated. Will increase his levothyroxine to and recheck in 6 weeks. Call with any concerns.

## 2023-08-28 NOTE — Progress Notes (Signed)
 BP 118/68   Pulse (!) 47   Temp 98 F (36.7 C) (Oral)   Wt 166 lb 6.4 oz (75.5 kg)   BMI 23.21 kg/m    Subjective:    Patient ID: [name], male    DOB: [birth year], 83 y.o.   MRN: [id number]  HPI: [name] is a 83 y.o. male  No chief complaint on file.  HYPOTHYROIDISM Thyroid control status:uncontrolled Satisfied with current treatment? no Medication side effects: no Medication compliance: good compliance Recent dose adjustment:yes Fatigue: yes Cold intolerance: no Heat intolerance: no Weight gain: no Weight loss: no Constipation: no Diarrhea/loose stools: no Palpitations: no Lower extremity edema: no Anxiety/depressed mood: no  HYPERTENSION / HYPERLIPIDEMIA Satisfied with current treatment? yes Duration of hypertension: chronic BP monitoring frequency: not checking BP medication side effects: no Past BP meds: metoprolol, lasix Duration of hyperlipidemia: chronic Cholesterol medication side effects: no Cholesterol supplements: none Past cholesterol medications: simvastatin Medication compliance: excellent compliance Aspirin: no Recent stressors: no Recurrent headaches: no Visual changes: no Palpitations: no Dyspnea: no Chest pain: no Lower extremity edema: no Dizzy/lightheaded: no   Relevant past medical, surgical, family and social history reviewed and updated as indicated. Interim medical history since our last visit reviewed. Allergies and medications reviewed and updated.  Review of Systems  Constitutional:  Positive for fatigue. Negative for activity change, appetite change, chills, diaphoresis, fever and unexpected weight change.  Respiratory: Negative.    Cardiovascular: Negative.   Gastrointestinal: Negative.   Musculoskeletal: Negative.   Neurological: Negative.   Psychiatric/Behavioral: Negative.      Per HPI unless specifically indicated above     Objective:    BP 118/68   Pulse (!) 47   Temp 98 F (36.7 C) (Oral)    Wt 166 lb 6.4 oz (75.5 kg)   BMI 23.21 kg/m   Wt Readings from Last 3 Encounters:  08/28/23 166 lb 6.4 oz (75.5 kg)  08/04/23 165 lb (74.8 kg)  08/03/23 169 lb 1.5 oz (76.7 kg)    Physical Exam Vitals and nursing note reviewed.  Constitutional:      General: He is not in acute distress.    Appearance: Normal appearance. He is normal weight. He is not ill-appearing, toxic-appearing or diaphoretic.  HENT:     Head: Normocephalic and atraumatic.     Right Ear: External ear normal.     Left Ear: External ear normal.     Nose: Nose normal.     Mouth/Throat:     Mouth: Mucous membranes are moist.     Pharynx: Oropharynx is clear.  Eyes:     General: No scleral icterus.       Right eye: No discharge.        Left eye: No discharge.     Extraocular Movements: Extraocular movements intact.     Conjunctiva/sclera: Conjunctivae normal.     Pupils: Pupils are equal, round, and reactive to light.  Cardiovascular:     Rate and Rhythm: Normal rate and regular rhythm.     Pulses: Normal pulses.     Heart sounds: Normal heart sounds. No murmur heard.    No friction rub. No gallop.  Pulmonary:     Effort: Pulmonary effort is normal. No respiratory distress.     Breath sounds: Normal breath sounds. No stridor. No wheezing, rhonchi or rales.  Chest:     Chest wall: No tenderness.  Musculoskeletal:        General: Normal range of motion.  Cervical back: Normal range of motion and neck supple.  Skin:    General: Skin is warm and dry.     Capillary Refill: Capillary refill takes less than 2 seconds.     Coloration: Skin is not jaundiced or pale.     Findings: No bruising, erythema, lesion or rash.  Neurological:     General: No focal deficit present.     Mental Status: He is alert and oriented to person, place, and time. Mental status is at baseline.  Psychiatric:        Mood and Affect: Mood normal.        Behavior: Behavior normal.        Thought Content: Thought content normal.         Judgment: Judgment normal.     Results for orders placed or performed in visit on 08/23/23  Iron and TIBC([facility])   Collection Time: 08/23/23  9:40 AM  Result Value Ref Range   Iron 53 45 - 182 ug/dL   TIBC 454 (L) 098 - 119 ug/dL   Saturation Ratios 26 17.9 - 39.5 %   UIBC 150 ug/dL  Ferritin   Collection Time: 08/23/23  9:40 AM  Result Value Ref Range   Ferritin 583 (H) 24 - 336 ng/mL  CBC with Differential (Cancer Center Only)   Collection Time: 08/23/23  9:40 AM  Result Value Ref Range   WBC Count 6.6 4.0 - 10.5 K/uL   RBC 3.26 (L) 4.22 - 5.81 MIL/uL   Hemoglobin 9.2 (L) 13.0 - 17.0 g/dL   HCT 14.7 (L) 82.9 - 56.2 %   MCV 97.2 80.0 - 100.0 fL   MCH 28.2 26.0 - 34.0 pg   MCHC 29.0 (L) 30.0 - 36.0 g/dL   RDW 13.0 (H) 86.5 - 78.4 %   Platelet Count 314 150 - 400 K/uL   nRBC 1.8 (H) 0.0 - 0.2 %   Neutrophils Relative % 62 %   Neutro Abs 4.1 1.7 - 7.7 K/uL   Lymphocytes Relative 14 %   Lymphs Abs 0.9 0.7 - 4.0 K/uL   Monocytes Relative 15 %   Monocytes Absolute 1.0 0.1 - 1.0 K/uL   Eosinophils Relative 0 %   Eosinophils Absolute 0.0 0.0 - 0.5 K/uL   Basophils Relative 1 %   Basophils Absolute 0.0 0.0 - 0.1 K/uL   WBC Morphology Mild Left Shift (1-5% metas, occ myelo)    Smear Review Normal platelet morphology    Immature Granulocytes 8 %   Abs Immature Granulocytes 0.53 (H) 0.00 - 0.07 K/uL   Polychromasia PRESENT    Target Cells PRESENT    *Note: Due to a large number of results and/or encounters for the requested time period, some results have not been displayed. A complete set of results can be found in Results Review.      Assessment & Plan:   Problem List Items Addressed This Visit       Endocrine   Hypothyroidism   Undertreated. Will increase his levothyroxine to and recheck in 6 weeks. Call with any concerns.       Relevant Medications   levothyroxine (SYNTHROID) 100 MCG tablet     Genitourinary   Benign hypertensive renal  disease - Primary   Under good control on current regimen. Continue current regimen. Continue to monitor. Call with any concerns. Refills up to date. Recheck labs in 6 weeks.          Other   Mixed hyperlipidemia  Under good control on current regimen. Continue current regimen. Continue to monitor. Call with any concerns. Refills up to date. Recheck labs in 6 weeks.       Low vitamin B12 level   Under good control on current regimen. Continue current regimen. Continue to monitor. Call with any concerns. Refills up to date. Recheck labs in 6 weeks.         Follow up plan: Return in about 6 weeks (around 10/09/2023) for physical.

## 2023-08-30 ENCOUNTER — Telehealth: Payer: Self-pay | Admitting: Pharmacy Technician

## 2023-08-30 ENCOUNTER — Other Ambulatory Visit: Payer: Self-pay | Admitting: *Deleted

## 2023-08-30 ENCOUNTER — Inpatient Hospital Stay: Admitting: Pharmacist

## 2023-08-30 ENCOUNTER — Encounter: Payer: Self-pay | Admitting: Oncology

## 2023-08-30 ENCOUNTER — Inpatient Hospital Stay: Payer: Medicare Other

## 2023-08-30 ENCOUNTER — Other Ambulatory Visit (HOSPITAL_COMMUNITY): Payer: Self-pay

## 2023-08-30 ENCOUNTER — Inpatient Hospital Stay (HOSPITAL_BASED_OUTPATIENT_CLINIC_OR_DEPARTMENT_OTHER): Payer: Medicare Other | Admitting: Oncology

## 2023-08-30 VITALS — BP 99/69 | HR 71 | Temp 97.9°F | Resp 19 | Ht 71.0 in | Wt 168.3 lb

## 2023-08-30 DIAGNOSIS — D7581 Myelofibrosis: Secondary | ICD-10-CM

## 2023-08-30 DIAGNOSIS — N183 Chronic kidney disease, stage 3 unspecified: Secondary | ICD-10-CM

## 2023-08-30 DIAGNOSIS — D649 Anemia, unspecified: Secondary | ICD-10-CM

## 2023-08-30 DIAGNOSIS — I129 Hypertensive chronic kidney disease with stage 1 through stage 4 chronic kidney disease, or unspecified chronic kidney disease: Secondary | ICD-10-CM | POA: Diagnosis not present

## 2023-08-30 DIAGNOSIS — Z79899 Other long term (current) drug therapy: Secondary | ICD-10-CM | POA: Diagnosis not present

## 2023-08-30 DIAGNOSIS — Z7189 Other specified counseling: Secondary | ICD-10-CM

## 2023-08-30 DIAGNOSIS — D631 Anemia in chronic kidney disease: Secondary | ICD-10-CM | POA: Diagnosis not present

## 2023-08-30 DIAGNOSIS — M5412 Radiculopathy, cervical region: Secondary | ICD-10-CM | POA: Diagnosis not present

## 2023-08-30 DIAGNOSIS — D509 Iron deficiency anemia, unspecified: Secondary | ICD-10-CM | POA: Diagnosis not present

## 2023-08-30 DIAGNOSIS — M9903 Segmental and somatic dysfunction of lumbar region: Secondary | ICD-10-CM | POA: Diagnosis not present

## 2023-08-30 DIAGNOSIS — M5033 Other cervical disc degeneration, cervicothoracic region: Secondary | ICD-10-CM | POA: Diagnosis not present

## 2023-08-30 DIAGNOSIS — M9901 Segmental and somatic dysfunction of cervical region: Secondary | ICD-10-CM | POA: Diagnosis not present

## 2023-08-30 DIAGNOSIS — N184 Chronic kidney disease, stage 4 (severe): Secondary | ICD-10-CM | POA: Diagnosis not present

## 2023-08-30 LAB — SAMPLE TO BLOOD BANK

## 2023-08-30 LAB — HEMOGLOBIN AND HEMATOCRIT (CANCER CENTER ONLY)
HCT: 30.8 % — ABNORMAL LOW (ref 39.0–52.0)
Hemoglobin: 9.1 g/dL — ABNORMAL LOW (ref 13.0–17.0)

## 2023-08-30 MED ORDER — EPOETIN ALFA-EPBX 40000 UNIT/ML IJ SOLN
40000.0000 [IU] | Freq: Once | INTRAMUSCULAR | Status: AC
  Administered 2023-08-30: 40000 [IU] via SUBCUTANEOUS
  Filled 2023-08-30: qty 1

## 2023-08-30 MED ORDER — EPOETIN ALFA-EPBX 10000 UNIT/ML IJ SOLN
10000.0000 [IU] | Freq: Once | INTRAMUSCULAR | Status: AC
  Administered 2023-08-30: 10000 [IU] via SUBCUTANEOUS
  Filled 2023-08-30: qty 1

## 2023-08-30 MED ORDER — LENALIDOMIDE 5 MG PO CAPS: 5.0000 mg | ORAL_CAPSULE | Freq: Every day | ORAL | 0 refills | Status: DC

## 2023-08-30 MED ORDER — LENALIDOMIDE 5 MG PO CAPS: 5.0000 mg | ORAL_CAPSULE | Freq: Every day | ORAL | Status: DC

## 2023-08-30 NOTE — Progress Notes (Signed)
 Clinical Pharmacist Practitioner [facility]  [phone] [phone]  Patient Care Team: [name], DO as PCP - General (Family Medicine) Mariah Milling Tollie Pizza, MD as PCP - Cardiology (Cardiology) Jolayne Panther, MD as Referring Physician (Hematology) Sherrie George, MD as Consulting Physician (Ophthalmology) Micki Riley, MD as Consulting Physician (Neurology) Jerilee Field, MD as Consulting Physician (Urology) Antonieta Iba, MD as Consulting Physician (Cardiology) Riki Altes, MD (Urology) Creig Hines, MD as Consulting Physician (Hematology and Oncology) Creig Hines, MD as Consulting Physician (Oncology) Johnnette Barrios, RN as [facility] Care Management   Name of the patient: [name]  [id number]  08/31/1940   Date of visit: 08/30/23  HPI: 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 Planned treatment with lenalidomide.   Reason for Consult: Lenalidomide oral chemotherapy education.   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    ALLERGIES:  is allergic to meloxicam.  MEDICATIONS:  Current Outpatient Medications  Medication Sig Dispense Refill   acetaminophen (TYLENOL) 500 MG tablet Take 1,000 mg by mouth every 6  (six) hours as needed for mild pain (pain score 1-3).     amiodarone (PACERONE) 200 MG tablet Take 200 mg by mouth daily.     amLODipine (NORVASC) 10 MG tablet Take 1 tablet by mouth daily.     apixaban (ELIQUIS) 2.5 MG TABS tablet Take 1 tablet (2.5 mg total) by mouth 2 (two) times daily. 180 tablet 3   azelastine (ASTELIN) 0.1 % nasal spray Place 1 spray into both nostrils 2 (two) times daily. Use in each nostril as directed (Patient taking differently: Place 1 spray into both nostrils 2 (two) times daily as needed for rhinitis or allergies. Use in each nostril as directed) 30 mL 0   calcium carbonate (OSCAL) 1500 (600 Ca) MG TABS tablet Take by mouth.     Calcium Citrate-Vitamin D (CITRACAL + D PO) Take 1 tablet by mouth in the morning.     Cholecalciferol (D3-1000 PO) Take 1,000 Units by mouth in the morning.     cyanocobalamin 1000 MCG tablet Take 1,000 mcg by mouth daily.     cyclobenzaprine (FLEXERIL) 10 MG tablet TAKE ONE TABLET BY MOUTH AT BEDTIME 30 tablet 0   DULoxetine (CYMBALTA) 20 MG capsule Take 1 capsule (20 mg total) by mouth at bedtime. 90 capsule 1   furosemide (LASIX) 40 MG tablet Take 1 tablet (40 mg total) by mouth daily. 90 tablet 1   gabapentin (NEURONTIN) 300 MG capsule Take 1 capsule (300 mg total) by mouth 2 (two) times daily. (Patient taking differently: Take 300 mg by mouth at bedtime.) 180 capsule 1   ketoconazole (  NIZORAL) 2 % cream Apply twice daily to affected area on buttocks until clear 60 g 2   levothyroxine (SYNTHROID) 100 MCG tablet Take 1 tablet (100 mcg total) by mouth daily before breakfast. 90 tablet 0   lidocaine (LIDODERM) 5 % Place 1 patch onto the skin daily. Remove & Discard patch within 12 hours or as directed by MD 30 patch 2   Lidocaine-Menthol (NERVIVE ROLL-ON EX) Apply 1 application  topically every 4 (four) hours as needed (mild pain).     meclizine (ANTIVERT) 25 MG tablet TAKE 1 TABLET BY MOUTH THREE TIMES DAILYAS NEEDED FOR DIZZINESS 90 tablet 6    melatonin 3 MG TABS tablet Take 1 tablet (3 mg total) by mouth at bedtime as needed (insomnia). 100 tablet 3   metoprolol succinate (TOPROL-XL) 25 MG 24 hr tablet Take 25 mg by mouth daily.     Multiple Vitamin (MULITIVITAMIN WITH MINERALS) TABS Take 1 tablet by mouth in the morning.     pantoprazole (PROTONIX) 40 MG tablet Take 1 tablet (40 mg total) by mouth daily. 90 tablet 1   predniSONE (DELTASONE) 10 MG tablet Take by mouth.     QUEtiapine (SEROQUEL) 25 MG tablet TAKE ONE TABLET BY MOUTH AT BEDTIME 30 tablet 0   simvastatin (ZOCOR) 40 MG tablet TAKE ONE TABLET BY MOUTH EVERY DAY 90 tablet 3   sodium zirconium cyclosilicate (LOKELMA) 10 g PACK packet Take by mouth.     tamsulosin (FLOMAX) 0.4 MG CAPS capsule Take 2 capsules (0.8 mg total) by mouth every evening. 180 capsule 1   traMADol (ULTRAM) 50 MG tablet Take by mouth.     zinc gluconate 50 MG tablet Take 50 mg by mouth in the morning.     No current facility-administered medications for this visit.   Facility-Administered Medications Ordered in Other Visits  Medication Dose Route Frequency Provider Last Rate Last Admin   epoetin alfa-epbx (RETACRIT) injection 10,000 Units  10,000 Units Subcutaneous Once Creig Hines, MD       epoetin alfa-epbx (RETACRIT) injection 40,000 Units  40,000 Units Subcutaneous Once Creig Hines, MD        VITAL SIGNS: There were no vitals taken for this visit. There were no vitals filed for this visit.  Estimated body mass index is 23.47 kg/m as calculated from the following:   Height as of an earlier encounter on 08/30/23: 5\' 11"  (1.803 m).   Weight as of an earlier encounter on 08/30/23: 76.3 kg (168 lb 4.8 oz).  LABS: CBC:    Component Value Date/Time   WBC 6.6 08/23/2023 0940   WBC 4.9 08/03/2023 0916   HGB 9.1 (L) 08/30/2023 0944   HGB 6.9 (LL) 07/04/2023 1508   HCT 30.8 (L) 08/30/2023 0944   HCT 21.6 (L) 07/04/2023 1508   PLT 314 08/23/2023 0940   PLT 281 07/04/2023 1508   MCV  97.2 08/23/2023 0940   MCV 90 07/04/2023 1508   MCV 90 06/18/2014 1015   NEUTROABS 4.1 08/23/2023 0940   NEUTROABS 12.8 (H) 06/12/2023 1602   NEUTROABS 1.8 06/18/2014 1015   LYMPHSABS 0.9 08/23/2023 0940   LYMPHSABS 0.6 (L) 06/12/2023 1602   LYMPHSABS 0.6 (L) 06/18/2014 1015   MONOABS 1.0 08/23/2023 0940   MONOABS 0.3 06/18/2014 1015   EOSABS 0.0 08/23/2023 0940   EOSABS 0.0 06/12/2023 1602   EOSABS 0.0 06/18/2014 1015   BASOSABS 0.0 08/23/2023 0940   BASOSABS 0.0 06/12/2023 1602   BASOSABS 0.0 06/18/2014 1015  Comprehensive Metabolic Panel:    Component Value Date/Time   NA 142 08/16/2023 1542   NA 141 06/18/2014 1015   K 5.8 (H) 08/16/2023 1542   K 4.5 06/18/2014 1015   CL 108 (H) 08/16/2023 1542   CL 107 06/18/2014 1015   CO2 22 08/16/2023 1542   CO2 26 06/18/2014 1015   BUN 53 (H) 08/16/2023 1542   BUN 20 (H) 06/18/2014 1015   CREATININE 3.04 (H) 08/16/2023 1542   CREATININE 2.08 (H) 10/19/2022 0941   CREATININE 1.09 06/18/2014 1015   GLUCOSE 101 (H) 08/16/2023 1542   GLUCOSE 102 (H) 06/29/2023 1001   GLUCOSE 160 (H) 06/18/2014 1015   CALCIUM 8.3 (L) 08/16/2023 1542   CALCIUM 8.5 06/18/2014 1015   AST 15 06/29/2023 1001   AST 24 10/19/2022 0941   ALT 22 06/29/2023 1001   ALT 21 10/19/2022 0941   ALT 43 06/18/2014 1015   ALKPHOS 67 06/29/2023 1001   ALKPHOS 118 (H) 06/18/2014 1015   BILITOT 0.7 06/29/2023 1001   BILITOT 0.6 10/19/2022 0941   PROT 5.4 (L) 06/29/2023 1001   PROT 5.4 (L) 09/13/2022 1403   PROT 6.6 06/18/2014 1015   ALBUMIN 2.9 (L) 06/29/2023 1001   ALBUMIN 3.9 09/13/2022 1403   ALBUMIN 3.9 06/18/2014 1015     Present during today's visit: patient only  Start plan: patient will start once he has access to medication   Patient Education I spoke with patient for overview of new oral chemotherapy medication: lenalidomide   Reviewed lenalidomide REMs program with patient and provided him with a REMS handout.  Administration: Counseled  patient on administration, dosing, side effects, monitoring, drug-food interactions, safe handling, storage, and disposal. Patient will take 1 capsule (10mg ) by mouth daily. Take for 21 days, then hold for 7 days. Repeat every 28 days.  Side Effects: Side effects include but not limited to: rash/itchy skin, N/V, fatigue, decreased wbc/hgb/plt, constipation or diarrhea.    Drug-drug Interactions (DDI): No current DDIs with lenalidomide  Adherence: After discussion with patient no patient barriers to medication adherence identified.  Reviewed with patient importance of keeping a medication schedule and plan for any missed doses.  Mr. Antos voiced understanding and appreciation. All questions answered. Medication handout provided.  Provided patient with Oral Chemotherapy Navigation Clinic phone number. Patient knows to call the office with questions or concerns. Oral Chemotherapy Navigation Clinic will continue to follow.  Patient expressed understanding and was in agreement with this plan. He also understands that He can call clinic at any time with any questions, concerns, or complaints.   Medication Access Issues: PA approved and grant obtained, Rx sent to [facility]  Follow-up plan: RTC as scheduled  Thank you for allowing me to participate in the care of this patient.   Time Total: 15 mins  Visit consisted of counseling and education on dealing with issues of symptom management in the setting of serious and potentially life-threatening illness.Greater than 50%  of this time was spent counseling and coordinating care related to the above assessment and plan.  Signed by: Remi Haggard, PharmD, Nolon Bussing, CPP Hematology/Oncology Clinical Pharmacist Practitioner [place] [phone]  08/30/2023 11:56 AM

## 2023-08-30 NOTE — Telephone Encounter (Addendum)
 Oral Oncology Patient Advocate Encounter   Was successful in securing patient an $54 grant from [employer] [facility]) to provide copayment coverage for Lenalidomide.  This will keep the out of pocket expense at $0.     The billing information is as follows and has been shared with [facility].   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

## 2023-08-30 NOTE — Telephone Encounter (Signed)
 Oral Oncology Patient Advocate Encounter  After completing a benefits investigation, prior authorization for Lenalidomide is not required at this time through [facility].  Patient's copay is $554.96.     Patty Almedia Balls, CPhT Oncology Pharmacy Patient Advocate [facility] Direct Number: [phone] Fax: [phone]

## 2023-08-30 NOTE — Addendum Note (Signed)
 Addended by: Owens Shark C on: 08/30/2023 01:16 PM   Modules accepted: Orders

## 2023-08-30 NOTE — Progress Notes (Addendum)
 Hematology/Oncology Consult note [facility]  [phone] [phone]  Patient Care Team: [name], DO as PCP - General (Family Medicine) Mariah Milling Tollie Pizza, MD as PCP - Cardiology (Cardiology) Jolayne Panther, MD as Referring Physician (Hematology) Sherrie George, MD as Consulting Physician (Ophthalmology) Micki Riley, MD as Consulting Physician (Neurology) Jerilee Field, MD as Consulting Physician (Urology) Antonieta Iba, MD as Consulting Physician (Cardiology) Riki Altes, MD (Urology) Creig Hines, MD as Consulting Physician (Hematology and Oncology) Creig Hines, MD as Consulting Physician (Oncology) Johnnette Barrios, RN as [facility]   Name of the patient: [name]  [id number]  03/18/1941   Date of visit: 08/30/23  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  At that time he was found to have a mild splenomegaly of 15.8 cm.DIPPS score is 26 (age 33- 1, hemoglobin less than 10- 2) and score of 4 if 1% circulating blasts included from 07/17/2014.   Bone marrow on 06/11/2014 was most consistent with primary myelofibrosis.  Bone marrow biopsy showed 1% abnormal cells: CD45+, CD5+, CD10, CD11c+/-, CD19+, CD2-+, (dim), CD22+ (dim, CD23+, CD38-/+, FMC7-, HLA-DR+, sig lambda+(dim).  Blasts were not increased 1.2%; hypercellular for age: 46%; JAK2 V617F mutation was negative.  CALR mutation positive.  Flow cytometry included about 1% CLL/SLL phenotype cells (CD5+) of uncertain significance and some infiltrate into the marrow with increased atypical megakaryocytes.  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  MDS FISH panel was negative.   Patient also  follows up with [facility] benign hematology Dr. Ester Rink for his anemia.  Patient's hemoglobin was drifting down to the eights and was started on EPO in October 2022.   Patient has had worsening anemia requiring intermittent blood transfusions despite receiving Retacrit since October 2024.  PatientUnderwent a repeat bone marrow biopsy in February 2025.  Findings were consistent with myelofibrosis with evidence of possible dysplasia and increased ringed sideroblasts and questionable myeloid dysgranulopoiesis.  Cytogenetic studies could not be completed due to no metaphase cells available for analysis.  [employer] profile was also not completed due to limited specimen and poor quality DNA in the specimen.  Interval history-patient reports ongoing fatigue.He is having a hard time managing his ADLs.  No recent hospitalizations  ECOG PS- 3 Pain scale- 0   Review of systems- Review of Systems  Constitutional:  Positive for malaise/fatigue. Negative for chills, fever and weight loss.  HENT:  Negative for congestion, ear discharge and nosebleeds.   Eyes:  Negative for blurred vision.  Respiratory:  Negative for cough, hemoptysis, sputum production, shortness of breath and wheezing.   Cardiovascular:  Negative for chest pain, palpitations, orthopnea and claudication.  Gastrointestinal:  Negative for abdominal pain, blood in stool, constipation, diarrhea, heartburn, melena, nausea and vomiting.  Genitourinary:  Negative for dysuria, flank pain, frequency, hematuria and urgency.  Musculoskeletal:  Negative for back pain, joint pain and myalgias.  Skin:  Negative for rash.  Neurological:  Negative for dizziness, tingling, focal weakness, seizures, weakness and headaches.  Endo/Heme/Allergies:  Does not bruise/bleed easily.  Psychiatric/Behavioral:  Negative for depression and suicidal ideas. The patient does not have insomnia.       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   right forearm, [facility]   Stroke (HCC) 11/2007   R brain subcortical infarct     Past Surgical History:  Procedure Laterality Date   ASPIRATION / INJECTION RENAL CYST  07/08/2015   BACK SURGERY     approx 20- 25 years ago   COLONOSCOPY     EYE SURGERY     cataract both eyes   GAS INSERTION  08/11/2011   Procedure: INSERTION OF GAS;  Surgeon: Sherrie George, MD;  Location: [facility] OR;  Service: Ophthalmology;  Laterality: Right;  C3F8   IR BONE MARROW BIOPSY & ASPIRATION  08/03/2023   LEFT HEART CATH AND CORONARY ANGIOGRAPHY N/A 05/02/2023   Procedure: LEFT HEART CATH AND CORONARY ANGIOGRAPHY;  Surgeon: Yvonne Kendall, MD;  Location: [facility] INVASIVE CV LAB;  Service: Cardiovascular;  Laterality: N/A;   REVERSE SHOULDER ARTHROPLASTY Right 04/13/2023   Procedure: REVERSE SHOULDER ARTHROPLASTY;  Surgeon: Christena Flake, MD;  Location: [facility] ORS;  Service: Orthopedics;  Laterality: Right;   SCLERAL BUCKLE  08/11/2011   Procedure: SCLERAL BUCKLE;  Surgeon: Sherrie George, MD;  Location: [facility] OR;  Service: Ophthalmology;  Laterality: Right;   VARICOSE VEIN SURGERY      Social History   Socioeconomic History   Marital status: Married    Spouse name: [name]    Number of children: 2   Years of education: 12+   Highest education level: Some college, no degree  Occupational History   Occupation: Fish farm manager: OTHER    Comment:  community   Occupation: SELF EMPLOYED    Employer: SELF EMPLOYED  Tobacco Use   Smoking status: Former    Current packs/day: 0.00    Types: Cigarettes    Quit date: 06/06/2010    Years since quitting: 13.2   Smokeless tobacco: Never  Vaping Use   Vaping status: Never Used  Substance and Sexual Activity   Alcohol use: No    Alcohol/week: 0.0 standard drinks of alcohol   Drug use: No   Sexual activity: Not Currently  Other Topics Concern   Not on file  Social History Narrative   Pt lives at home with his family.   Caffeine Use- 2 cups daily   Patient has 2 children.    Patient has some college.    Patient is right handed.          Works full time   Social Drivers of Corporate investment banker Strain: Low Risk  (07/12/2021)   Overall Financial Resource Strain (CARDIA)    Difficulty of Paying Living Expenses: Not hard at all  Food Insecurity: No Food Insecurity (07/12/2023)   Hunger Vital Sign    Worried About Running Out of Food in the Last Year: Never true    Ran Out of Food in the Last Year: Never true  Transportation  Needs: No Transportation Needs (07/12/2023)   PRAPARE - Administrator, Civil Service (Medical): No    Lack of Transportation (Non-Medical): No  Physical Activity: Inactive (07/12/2021)   Exercise Vital Sign    Days of Exercise per Week: 0 days    Minutes of Exercise per Session: 0 min  Stress: No Stress Concern Present (07/12/2021)   [employer] of Occupational Health - Occupational Stress Questionnaire    Feeling of Stress : Not at all  Social Connections: Socially Integrated (07/11/2023)   Social Connection and Isolation Panel [NHANES]    Frequency of Communication with Friends and Family: More than three times a week    Frequency of Social Gatherings with Friends and Family: More than three times a week    Attends Religious Services: More than 4 times per year    Active Member of [employer] or Organizations: Yes    Attends Hospital doctor: More than 4 times per year    Marital Status: Married  Catering manager Violence: Not At Risk (07/12/2023)   Humiliation, Afraid, Rape, and Kick questionnaire    Fear of Current or Ex-Partner: No    Emotionally Abused: No    Physically Abused: No    Sexually Abused: No    Family History  Problem Relation Age of Onset   Heart disease Father    Dementia Sister    Diabetes Son    Kidney disease Neg Hx    Prostate cancer Neg Hx      Current Outpatient Medications:    calcium carbonate (OSCAL) 1500 (600 Ca) MG TABS tablet, Take by mouth., Disp: , Rfl:    predniSONE (DELTASONE) 10 MG tablet, Take by mouth., Disp: , Rfl:    sodium zirconium cyclosilicate (LOKELMA) 10 g PACK packet, Take by mouth., Disp: , Rfl:    acetaminophen (TYLENOL) 500 MG tablet, Take 1,000 mg by mouth every 6 (six) hours as needed for mild pain (pain score 1-3)., Disp: , Rfl:    amiodarone (PACERONE) 200 MG tablet, Take 200 mg by mouth daily., Disp: , Rfl:    amLODipine (NORVASC) 10 MG tablet, Take 1 tablet by mouth daily., Disp: , Rfl:    apixaban (ELIQUIS) 2.5 MG TABS tablet, Take 1 tablet (2.5 mg total) by mouth 2 (two) times daily., Disp: 180 tablet, Rfl: 3   azelastine (ASTELIN) 0.1 % nasal spray, Place 1 spray into both nostrils 2 (two) times daily. Use in each nostril as directed (Patient taking differently: Place 1 spray into both nostrils 2 (two) times daily as needed for rhinitis or allergies. Use in each nostril as directed), Disp: 30 mL, Rfl: 0   Calcium Citrate-Vitamin D (CITRACAL + D PO), Take 1 tablet by mouth in the morning., Disp: , Rfl:    Cholecalciferol (D3-1000 PO), Take 1,000 Units by mouth in the morning., Disp: , Rfl:    cyanocobalamin 1000 MCG tablet, Take 1,000 mcg by mouth daily., Disp: , Rfl:    cyclobenzaprine (FLEXERIL) 10 MG tablet, TAKE ONE TABLET BY MOUTH AT BEDTIME, Disp: 30 tablet, Rfl: 0   DULoxetine (CYMBALTA) 20 MG capsule, Take 1 capsule (20 mg total) by mouth at  bedtime., Disp: 90 capsule, Rfl: 1   furosemide (LASIX) 40 MG tablet, Take 1 tablet (40 mg total) by mouth daily., Disp: 90 tablet, Rfl: 1   gabapentin (NEURONTIN) 300 MG capsule, Take 1 capsule (300 mg total) by mouth 2 (two) times daily. (Patient taking differently: Take 300 mg by mouth  at bedtime.), Disp: 180 capsule, Rfl: 1   ketoconazole (NIZORAL) 2 % cream, Apply twice daily to affected area on buttocks until clear, Disp: 60 g, Rfl: 2   levothyroxine (SYNTHROID) 100 MCG tablet, Take 1 tablet (100 mcg total) by mouth daily before breakfast., Disp: 90 tablet, Rfl: 0   lidocaine (LIDODERM) 5 %, Place 1 patch onto the skin daily. Remove & Discard patch within 12 hours or as directed by MD, Disp: 30 patch, Rfl: 2   Lidocaine-Menthol (NERVIVE ROLL-ON EX), Apply 1 application  topically every 4 (four) hours as needed (mild pain)., Disp: , Rfl:    meclizine (ANTIVERT) 25 MG tablet, TAKE 1 TABLET BY MOUTH THREE TIMES DAILYAS NEEDED FOR DIZZINESS, Disp: 90 tablet, Rfl: 6   melatonin 3 MG TABS tablet, Take 1 tablet (3 mg total) by mouth at bedtime as needed (insomnia)., Disp: 100 tablet, Rfl: 3   metoprolol succinate (TOPROL-XL) 25 MG 24 hr tablet, Take 25 mg by mouth daily., Disp: , Rfl:    Multiple Vitamin (MULITIVITAMIN WITH MINERALS) TABS, Take 1 tablet by mouth in the morning., Disp: , Rfl:    pantoprazole (PROTONIX) 40 MG tablet, Take 1 tablet (40 mg total) by mouth daily., Disp: 90 tablet, Rfl: 1   QUEtiapine (SEROQUEL) 25 MG tablet, TAKE ONE TABLET BY MOUTH AT BEDTIME, Disp: 30 tablet, Rfl: 0   simvastatin (ZOCOR) 40 MG tablet, TAKE ONE TABLET BY MOUTH EVERY DAY, Disp: 90 tablet, Rfl: 3   tamsulosin (FLOMAX) 0.4 MG CAPS capsule, Take 2 capsules (0.8 mg total) by mouth every evening., Disp: 180 capsule, Rfl: 1   traMADol (ULTRAM) 50 MG tablet, Take by mouth., Disp: , Rfl:    zinc gluconate 50 MG tablet, Take 50 mg by mouth in the morning., Disp: , Rfl:  No current facility-administered medications  for this visit.  Facility-Administered Medications Ordered in Other Visits:    epoetin alfa-epbx (RETACRIT) injection 10,000 Units, 10,000 Units, Subcutaneous, Once, Creig Hines, MD   epoetin alfa-epbx (RETACRIT) injection 40,000 Units, 40,000 Units, Subcutaneous, Once, Creig Hines, MD  Physical exam:  Vitals:   08/30/23 1005  BP: 99/69  Pulse: 71  Resp: 19  Temp: 97.9 F (36.6 C)  TempSrc: Tympanic  SpO2: 98%  Weight: 168 lb 4.8 oz (76.3 kg)  Height: 5\' 11"  (1.803 m)   Physical Exam Constitutional:      Comments: Sitting in a wheelchair and appears frail and fatigued  Cardiovascular:     Rate and Rhythm: Normal rate and regular rhythm.     Heart sounds: Normal heart sounds.  Pulmonary:     Effort: Pulmonary effort is normal.     Breath sounds: Normal breath sounds.  Abdominal:     General: Bowel sounds are normal.     Palpations: Abdomen is soft.  Musculoskeletal:     Right lower leg: No edema.     Left lower leg: No edema.  Skin:    General: Skin is warm and dry.  Neurological:     Mental Status: He is alert and oriented to person, place, and time.         Latest Ref Rng & Units 08/16/2023    3:42 PM  CMP  Glucose 70 - 99 mg/dL 387   BUN 8 - 27 mg/dL 53   Creatinine 5.64 - 1.27 mg/dL 3.32   Sodium 951 - 884 mmol/L 142   Potassium 3.5 - 5.2 mmol/L 5.8   Chloride 96 - 106 mmol/L 108  CO2 20 - 29 mmol/L 22   Calcium 8.6 - 10.2 mg/dL 8.3       Latest Ref Rng & Units 08/30/2023    9:44 AM  CBC  Hemoglobin 13.0 - 17.0 g/dL 9.1   Hematocrit 47.8 - 52.0 % 30.8     No images are attached to the encounter.  IR BONE MARROW BIOPSY & ASPIRATION Result Date: 08/03/2023 INDICATION: 83 year old male with history of anemia. EXAM: FLUOROSCOPIC-GUIDED BONE MARROW BIOPSY AND ASPIRATION MEDICATIONS: None ANESTHESIA/SEDATION: Fentanyl 50 mcg IV; Versed 1 mg IV Sedation Time: 10 minutes; The patient was continuously monitored during the procedure by the interventional  radiology nurse under my direct supervision. COMPLICATIONS: None immediate. PROCEDURE: Informed consent was obtained from the patient following an explanation of the procedure, risks, benefits and alternatives. The patient understands, agrees and consents for the procedure. All questions were addressed. A time out was performed prior to the initiation of the procedure. The patient was positioned prone and the right iliac marrow space was identified fluoroscopically. The operative site was prepped and draped in the usual sterile fashion. Under sterile conditions and local anesthesia, a 22 gauge spinal needle was utilized for procedural planning. Next, an 11 gauge coaxial bone biopsy needle was advanced into the right iliac marrow space. Initially, a bone marrow aspiration was performed. Next, a bone marrow biopsy was obtained with the 11 gauge outer bone marrow device. The needle was removed and superficial hemostasis was obtained with manual compression. A dressing was applied. The patient tolerated the procedure well without immediate post procedural complication. IMPRESSION: Successful fluoroscopic guided right iliac bone marrow aspiration and core biopsy. Marliss Coots, MD [facility] Electronically Signed   By: Marliss Coots M.D.   On: 08/03/2023 10:24     Assessment and plan- Patient is a 83 y.o. male with history of primary myelofibrosis with predominant anemia presently on Retacrit here for routine follow-up  Discussed the results of bone marrow biopsy which shows pre-existing myelofibrosis now with possible evidence of increased ring sideroblasts send there is granulopoiesis.  There was also evidence of possible dysplasia.  White count and platelets are normal and he predominantly has anemia which has not improved over the last 3 to 4 months despite Retacrit.  He has required intermittent blood transfusions.  I would therefore like to switch him  to Revlimid 5 mg 3 weeks on and 1 week off to see if it will help him with his anemia especially given that he also has underlying splenomegaly.  I have lowered his dose to account for his renal functions.  He will continue with Eliquis 2.5 mg twice daily which will also serve as thromboprophylaxis on Revlimid.  I will hold off on doing EPO while he is on Revlimid.  Discussed risks and benefits of medication including all but not limited to nausea vomiting diarrhea skin rash.  We will work on Clinical biochemist and manufacturers assistance for the same.  CBC with differential in 2 weeks in 4 weeks and I will see him back in 4 weeks each time for possible transfusion.  I will also check intelligent myeloid panel with his next set of labs   Visit Diagnosis 1. Myelofibrosis (HCC)   2. Iron deficiency anemia, unspecified iron deficiency anemia type   3. Anemia of chronic kidney failure, stage 3 (moderate) (HCC)   4. Erythropoietin (EPO) stimulating agent anemia management patient   5. Goals of care, counseling/discussion  Dr. Owens Shark, MD, MPH [facility] at [facility] [phone] 08/30/2023 12:55 PM

## 2023-08-31 ENCOUNTER — Telehealth: Payer: Self-pay | Admitting: *Deleted

## 2023-08-31 NOTE — Telephone Encounter (Signed)
 I asked [name] pharmacist and she will call biologics and figure it out

## 2023-09-01 ENCOUNTER — Other Ambulatory Visit (HOSPITAL_COMMUNITY): Payer: Self-pay

## 2023-09-06 ENCOUNTER — Telehealth: Payer: Self-pay | Admitting: Pharmacist

## 2023-09-06 DIAGNOSIS — M9903 Segmental and somatic dysfunction of lumbar region: Secondary | ICD-10-CM | POA: Diagnosis not present

## 2023-09-06 DIAGNOSIS — M5412 Radiculopathy, cervical region: Secondary | ICD-10-CM | POA: Diagnosis not present

## 2023-09-06 DIAGNOSIS — M9901 Segmental and somatic dysfunction of cervical region: Secondary | ICD-10-CM | POA: Diagnosis not present

## 2023-09-06 DIAGNOSIS — M5033 Other cervical disc degeneration, cervicothoracic region: Secondary | ICD-10-CM | POA: Diagnosis not present

## 2023-09-06 NOTE — Telephone Encounter (Signed)
 Spoke with patient's wife and she reports [name] started his lenalidomide on 09/04/23.

## 2023-09-12 ENCOUNTER — Other Ambulatory Visit: Payer: Self-pay

## 2023-09-12 DIAGNOSIS — N183 Chronic kidney disease, stage 3 unspecified: Secondary | ICD-10-CM

## 2023-09-13 ENCOUNTER — Telehealth: Payer: Self-pay

## 2023-09-13 ENCOUNTER — Inpatient Hospital Stay: Attending: Oncology

## 2023-09-13 DIAGNOSIS — D631 Anemia in chronic kidney disease: Secondary | ICD-10-CM | POA: Insufficient documentation

## 2023-09-13 DIAGNOSIS — I129 Hypertensive chronic kidney disease with stage 1 through stage 4 chronic kidney disease, or unspecified chronic kidney disease: Secondary | ICD-10-CM | POA: Diagnosis not present

## 2023-09-13 DIAGNOSIS — D7581 Myelofibrosis: Secondary | ICD-10-CM | POA: Diagnosis not present

## 2023-09-13 DIAGNOSIS — N183 Chronic kidney disease, stage 3 unspecified: Secondary | ICD-10-CM

## 2023-09-13 DIAGNOSIS — M9901 Segmental and somatic dysfunction of cervical region: Secondary | ICD-10-CM | POA: Diagnosis not present

## 2023-09-13 DIAGNOSIS — M9903 Segmental and somatic dysfunction of lumbar region: Secondary | ICD-10-CM | POA: Diagnosis not present

## 2023-09-13 DIAGNOSIS — N184 Chronic kidney disease, stage 4 (severe): Secondary | ICD-10-CM | POA: Insufficient documentation

## 2023-09-13 DIAGNOSIS — M5412 Radiculopathy, cervical region: Secondary | ICD-10-CM | POA: Diagnosis not present

## 2023-09-13 DIAGNOSIS — M5033 Other cervical disc degeneration, cervicothoracic region: Secondary | ICD-10-CM | POA: Diagnosis not present

## 2023-09-13 LAB — CBC WITH DIFFERENTIAL (CANCER CENTER ONLY)
Abs Immature Granulocytes: 0.33 10*3/uL — ABNORMAL HIGH (ref 0.00–0.07)
Basophils Absolute: 0 10*3/uL (ref 0.0–0.1)
Basophils Relative: 1 %
Eosinophils Absolute: 0 10*3/uL (ref 0.0–0.5)
Eosinophils Relative: 1 %
HCT: 31.8 % — ABNORMAL LOW (ref 39.0–52.0)
Hemoglobin: 9.4 g/dL — ABNORMAL LOW (ref 13.0–17.0)
Immature Granulocytes: 10 %
Lymphocytes Relative: 21 %
Lymphs Abs: 0.7 10*3/uL (ref 0.7–4.0)
MCH: 28.7 pg (ref 26.0–34.0)
MCHC: 29.6 g/dL — ABNORMAL LOW (ref 30.0–36.0)
MCV: 97 fL (ref 80.0–100.0)
Monocytes Absolute: 0.4 10*3/uL (ref 0.1–1.0)
Monocytes Relative: 12 %
Neutro Abs: 1.8 10*3/uL (ref 1.7–7.7)
Neutrophils Relative %: 55 %
Platelet Count: 204 10*3/uL (ref 150–400)
RBC: 3.28 MIL/uL — ABNORMAL LOW (ref 4.22–5.81)
RDW: 20.1 % — ABNORMAL HIGH (ref 11.5–15.5)
Smear Review: ADEQUATE
WBC Count: 3.3 10*3/uL — ABNORMAL LOW (ref 4.0–10.5)
nRBC: 0.6 % — ABNORMAL HIGH (ref 0.0–0.2)

## 2023-09-13 LAB — CMP (CANCER CENTER ONLY)
ALT: 25 U/L (ref 0–44)
AST: 23 U/L (ref 15–41)
Albumin: 3.5 g/dL (ref 3.5–5.0)
Alkaline Phosphatase: 129 U/L — ABNORMAL HIGH (ref 38–126)
Anion gap: 10 (ref 5–15)
BUN: 60 mg/dL — ABNORMAL HIGH (ref 8–23)
CO2: 24 mmol/L (ref 22–32)
Calcium: 7.6 mg/dL — ABNORMAL LOW (ref 8.9–10.3)
Chloride: 104 mmol/L (ref 98–111)
Creatinine: 3.72 mg/dL — ABNORMAL HIGH (ref 0.61–1.24)
GFR, Estimated: 15 mL/min — ABNORMAL LOW (ref >60)
Glucose, Bld: 116 mg/dL — ABNORMAL HIGH (ref 70–99)
Potassium: 4.5 mmol/L (ref 3.5–5.1)
Sodium: 138 mmol/L (ref 135–145)
Total Bilirubin: 0.6 mg/dL (ref 0.0–1.2)
Total Protein: 6 g/dL — ABNORMAL LOW (ref 6.5–8.1)

## 2023-09-13 LAB — SAMPLE TO BLOOD BANK

## 2023-09-13 NOTE — Telephone Encounter (Signed)
 Outbound call; spoke to caregiver / spouse [name].  Informed Hgb 9.4; no need to come to clinic for a transfusion tomorrow 09/14/23.  Caregiver verbalized understanding.

## 2023-09-14 ENCOUNTER — Inpatient Hospital Stay

## 2023-09-20 ENCOUNTER — Telehealth: Payer: Self-pay

## 2023-09-20 DIAGNOSIS — M5412 Radiculopathy, cervical region: Secondary | ICD-10-CM | POA: Diagnosis not present

## 2023-09-20 DIAGNOSIS — G5601 Carpal tunnel syndrome, right upper limb: Secondary | ICD-10-CM | POA: Diagnosis not present

## 2023-09-20 DIAGNOSIS — M9901 Segmental and somatic dysfunction of cervical region: Secondary | ICD-10-CM | POA: Diagnosis not present

## 2023-09-20 DIAGNOSIS — M5033 Other cervical disc degeneration, cervicothoracic region: Secondary | ICD-10-CM | POA: Diagnosis not present

## 2023-09-20 DIAGNOSIS — M9903 Segmental and somatic dysfunction of lumbar region: Secondary | ICD-10-CM | POA: Diagnosis not present

## 2023-09-20 NOTE — Telephone Encounter (Signed)
 Per [name] "[name]- please let patient know he is no longger getting retacrit injections. that's when he came weekly and probably got confused. please make sure he is taking his revlimid 3 weeks on and 1 week off and he will see me next week as planned ".  Voice message left on [phone]; will try calling again shortly.  Incoming call from patient and reviewed information above.  Also reviewed upcoming lab appointment next week 09/27/23 at 9:45AM followed by his appointment with [name] at 10:00.  Patient verbalized understanding; no additional questions / concerns at this time.

## 2023-09-21 ENCOUNTER — Encounter (HOSPITAL_COMMUNITY): Payer: Self-pay

## 2023-09-21 ENCOUNTER — Other Ambulatory Visit: Payer: Self-pay

## 2023-09-21 ENCOUNTER — Emergency Department (HOSPITAL_COMMUNITY)

## 2023-09-21 ENCOUNTER — Inpatient Hospital Stay (HOSPITAL_COMMUNITY)
Admission: EM | Admit: 2023-09-21 | Discharge: 2023-09-29 | DRG: 082 | Disposition: A | Discharge status: Rehabilitation Facility | Attending: Internal Medicine | Admitting: Internal Medicine

## 2023-09-21 DIAGNOSIS — S0081XA Abrasion of other part of head, initial encounter: Secondary | ICD-10-CM | POA: Diagnosis present

## 2023-09-21 DIAGNOSIS — E860 Dehydration: Secondary | ICD-10-CM | POA: Diagnosis present

## 2023-09-21 DIAGNOSIS — T68XXXA Hypothermia, initial encounter: Secondary | ICD-10-CM

## 2023-09-21 DIAGNOSIS — Z79899 Other long term (current) drug therapy: Secondary | ICD-10-CM

## 2023-09-21 DIAGNOSIS — M25512 Pain in left shoulder: Secondary | ICD-10-CM | POA: Diagnosis not present

## 2023-09-21 DIAGNOSIS — Z87891 Personal history of nicotine dependence: Secondary | ICD-10-CM

## 2023-09-21 DIAGNOSIS — R0989 Other specified symptoms and signs involving the circulatory and respiratory systems: Secondary | ICD-10-CM | POA: Diagnosis not present

## 2023-09-21 DIAGNOSIS — Z043 Encounter for examination and observation following other accident: Secondary | ICD-10-CM | POA: Diagnosis not present

## 2023-09-21 DIAGNOSIS — I480 Paroxysmal atrial fibrillation: Secondary | ICD-10-CM | POA: Diagnosis present

## 2023-09-21 DIAGNOSIS — M4312 Spondylolisthesis, cervical region: Secondary | ICD-10-CM | POA: Diagnosis not present

## 2023-09-21 DIAGNOSIS — Z515 Encounter for palliative care: Secondary | ICD-10-CM | POA: Diagnosis not present

## 2023-09-21 DIAGNOSIS — N401 Enlarged prostate with lower urinary tract symptoms: Secondary | ICD-10-CM | POA: Diagnosis present

## 2023-09-21 DIAGNOSIS — S43432A Superior glenoid labrum lesion of left shoulder, initial encounter: Secondary | ICD-10-CM | POA: Diagnosis not present

## 2023-09-21 DIAGNOSIS — M4802 Spinal stenosis, cervical region: Secondary | ICD-10-CM | POA: Diagnosis present

## 2023-09-21 DIAGNOSIS — G3184 Mild cognitive impairment, so stated: Secondary | ICD-10-CM | POA: Diagnosis not present

## 2023-09-21 DIAGNOSIS — M542 Cervicalgia: Secondary | ICD-10-CM | POA: Diagnosis not present

## 2023-09-21 DIAGNOSIS — M75102 Unspecified rotator cuff tear or rupture of left shoulder, not specified as traumatic: Secondary | ICD-10-CM | POA: Diagnosis present

## 2023-09-21 DIAGNOSIS — I4892 Unspecified atrial flutter: Secondary | ICD-10-CM | POA: Diagnosis not present

## 2023-09-21 DIAGNOSIS — D471 Chronic myeloproliferative disease: Secondary | ICD-10-CM | POA: Diagnosis present

## 2023-09-21 DIAGNOSIS — S066X0S Traumatic subarachnoid hemorrhage without loss of consciousness, sequela: Secondary | ICD-10-CM | POA: Diagnosis not present

## 2023-09-21 DIAGNOSIS — M75112 Incomplete rotator cuff tear or rupture of left shoulder, not specified as traumatic: Secondary | ICD-10-CM | POA: Diagnosis not present

## 2023-09-21 DIAGNOSIS — R338 Other retention of urine: Secondary | ICD-10-CM | POA: Diagnosis present

## 2023-09-21 DIAGNOSIS — I129 Hypertensive chronic kidney disease with stage 1 through stage 4 chronic kidney disease, or unspecified chronic kidney disease: Secondary | ICD-10-CM | POA: Diagnosis not present

## 2023-09-21 DIAGNOSIS — G8929 Other chronic pain: Secondary | ICD-10-CM | POA: Diagnosis present

## 2023-09-21 DIAGNOSIS — M1990 Unspecified osteoarthritis, unspecified site: Secondary | ICD-10-CM | POA: Diagnosis not present

## 2023-09-21 DIAGNOSIS — I440 Atrioventricular block, first degree: Secondary | ICD-10-CM | POA: Diagnosis not present

## 2023-09-21 DIAGNOSIS — R809 Proteinuria, unspecified: Secondary | ICD-10-CM | POA: Diagnosis present

## 2023-09-21 DIAGNOSIS — Z5181 Encounter for therapeutic drug level monitoring: Secondary | ICD-10-CM | POA: Diagnosis not present

## 2023-09-21 DIAGNOSIS — Z96611 Presence of right artificial shoulder joint: Secondary | ICD-10-CM | POA: Diagnosis present

## 2023-09-21 DIAGNOSIS — J90 Pleural effusion, not elsewhere classified: Secondary | ICD-10-CM | POA: Diagnosis present

## 2023-09-21 DIAGNOSIS — Z8249 Family history of ischemic heart disease and other diseases of the circulatory system: Secondary | ICD-10-CM

## 2023-09-21 DIAGNOSIS — R112 Nausea with vomiting, unspecified: Secondary | ICD-10-CM | POA: Diagnosis not present

## 2023-09-21 DIAGNOSIS — Z85820 Personal history of malignant melanoma of skin: Secondary | ICD-10-CM

## 2023-09-21 DIAGNOSIS — D469 Myelodysplastic syndrome, unspecified: Secondary | ICD-10-CM | POA: Diagnosis not present

## 2023-09-21 DIAGNOSIS — S46212A Strain of muscle, fascia and tendon of other parts of biceps, left arm, initial encounter: Secondary | ICD-10-CM | POA: Diagnosis not present

## 2023-09-21 DIAGNOSIS — M4187 Other forms of scoliosis, lumbosacral region: Secondary | ICD-10-CM | POA: Diagnosis not present

## 2023-09-21 DIAGNOSIS — S066X9S Traumatic subarachnoid hemorrhage with loss of consciousness of unspecified duration, sequela: Secondary | ICD-10-CM | POA: Diagnosis not present

## 2023-09-21 DIAGNOSIS — Y92008 Other place in unspecified non-institutional (private) residence as the place of occurrence of the external cause: Secondary | ICD-10-CM | POA: Diagnosis not present

## 2023-09-21 DIAGNOSIS — E872 Acidosis, unspecified: Secondary | ICD-10-CM | POA: Diagnosis not present

## 2023-09-21 DIAGNOSIS — D72819 Decreased white blood cell count, unspecified: Secondary | ICD-10-CM | POA: Diagnosis present

## 2023-09-21 DIAGNOSIS — N281 Cyst of kidney, acquired: Secondary | ICD-10-CM | POA: Diagnosis not present

## 2023-09-21 DIAGNOSIS — S066XAD Traumatic subarachnoid hemorrhage with loss of consciousness status unknown, subsequent encounter: Secondary | ICD-10-CM | POA: Diagnosis not present

## 2023-09-21 DIAGNOSIS — I959 Hypotension, unspecified: Secondary | ICD-10-CM | POA: Diagnosis not present

## 2023-09-21 DIAGNOSIS — M47812 Spondylosis without myelopathy or radiculopathy, cervical region: Secondary | ICD-10-CM | POA: Diagnosis not present

## 2023-09-21 DIAGNOSIS — Z7952 Long term (current) use of systemic steroids: Secondary | ICD-10-CM

## 2023-09-21 DIAGNOSIS — S066XAA Traumatic subarachnoid hemorrhage with loss of consciousness status unknown, initial encounter: Secondary | ICD-10-CM | POA: Diagnosis not present

## 2023-09-21 DIAGNOSIS — N179 Acute kidney failure, unspecified: Secondary | ICD-10-CM | POA: Diagnosis not present

## 2023-09-21 DIAGNOSIS — Z7901 Long term (current) use of anticoagulants: Secondary | ICD-10-CM | POA: Diagnosis not present

## 2023-09-21 DIAGNOSIS — N400 Enlarged prostate without lower urinary tract symptoms: Secondary | ICD-10-CM | POA: Diagnosis present

## 2023-09-21 DIAGNOSIS — D61810 Antineoplastic chemotherapy induced pancytopenia: Secondary | ICD-10-CM | POA: Diagnosis not present

## 2023-09-21 DIAGNOSIS — Z7189 Other specified counseling: Secondary | ICD-10-CM | POA: Diagnosis not present

## 2023-09-21 DIAGNOSIS — R609 Edema, unspecified: Secondary | ICD-10-CM | POA: Diagnosis present

## 2023-09-21 DIAGNOSIS — B957 Other staphylococcus as the cause of diseases classified elsewhere: Secondary | ICD-10-CM | POA: Diagnosis present

## 2023-09-21 DIAGNOSIS — S066X0A Traumatic subarachnoid hemorrhage without loss of consciousness, initial encounter: Secondary | ICD-10-CM | POA: Diagnosis not present

## 2023-09-21 DIAGNOSIS — Z6822 Body mass index (BMI) 22.0-22.9, adult: Secondary | ICD-10-CM | POA: Diagnosis not present

## 2023-09-21 DIAGNOSIS — I10 Essential (primary) hypertension: Secondary | ICD-10-CM | POA: Diagnosis not present

## 2023-09-21 DIAGNOSIS — I517 Cardiomegaly: Secondary | ICD-10-CM | POA: Diagnosis not present

## 2023-09-21 DIAGNOSIS — I609 Nontraumatic subarachnoid hemorrhage, unspecified: Secondary | ICD-10-CM

## 2023-09-21 DIAGNOSIS — S3993XA Unspecified injury of pelvis, initial encounter: Secondary | ICD-10-CM | POA: Diagnosis not present

## 2023-09-21 DIAGNOSIS — K219 Gastro-esophageal reflux disease without esophagitis: Secondary | ICD-10-CM | POA: Diagnosis present

## 2023-09-21 DIAGNOSIS — S199XXA Unspecified injury of neck, initial encounter: Secondary | ICD-10-CM | POA: Diagnosis not present

## 2023-09-21 DIAGNOSIS — W19XXXA Unspecified fall, initial encounter: Secondary | ICD-10-CM | POA: Diagnosis not present

## 2023-09-21 DIAGNOSIS — Z886 Allergy status to analgesic agent status: Secondary | ICD-10-CM

## 2023-09-21 DIAGNOSIS — E785 Hyperlipidemia, unspecified: Secondary | ICD-10-CM | POA: Diagnosis not present

## 2023-09-21 DIAGNOSIS — R197 Diarrhea, unspecified: Secondary | ICD-10-CM | POA: Diagnosis not present

## 2023-09-21 DIAGNOSIS — Z85828 Personal history of other malignant neoplasm of skin: Secondary | ICD-10-CM

## 2023-09-21 DIAGNOSIS — Z79890 Hormone replacement therapy (postmenopausal): Secondary | ICD-10-CM | POA: Diagnosis not present

## 2023-09-21 DIAGNOSIS — N170 Acute kidney failure with tubular necrosis: Secondary | ICD-10-CM | POA: Diagnosis not present

## 2023-09-21 DIAGNOSIS — S00212A Abrasion of left eyelid and periocular area, initial encounter: Secondary | ICD-10-CM | POA: Diagnosis present

## 2023-09-21 DIAGNOSIS — D7581 Myelofibrosis: Secondary | ICD-10-CM | POA: Diagnosis not present

## 2023-09-21 DIAGNOSIS — E440 Moderate protein-calorie malnutrition: Secondary | ICD-10-CM | POA: Diagnosis not present

## 2023-09-21 DIAGNOSIS — Z79891 Long term (current) use of opiate analgesic: Secondary | ICD-10-CM

## 2023-09-21 DIAGNOSIS — S066X9D Traumatic subarachnoid hemorrhage with loss of consciousness of unspecified duration, subsequent encounter: Secondary | ICD-10-CM | POA: Diagnosis not present

## 2023-09-21 DIAGNOSIS — S0990XA Unspecified injury of head, initial encounter: Secondary | ICD-10-CM

## 2023-09-21 DIAGNOSIS — G9341 Metabolic encephalopathy: Secondary | ICD-10-CM | POA: Diagnosis present

## 2023-09-21 DIAGNOSIS — N184 Chronic kidney disease, stage 4 (severe): Secondary | ICD-10-CM | POA: Diagnosis present

## 2023-09-21 DIAGNOSIS — S299XXA Unspecified injury of thorax, initial encounter: Secondary | ICD-10-CM | POA: Diagnosis not present

## 2023-09-21 DIAGNOSIS — E875 Hyperkalemia: Secondary | ICD-10-CM | POA: Diagnosis not present

## 2023-09-21 DIAGNOSIS — Z48813 Encounter for surgical aftercare following surgery on the respiratory system: Secondary | ICD-10-CM | POA: Diagnosis not present

## 2023-09-21 DIAGNOSIS — E039 Hypothyroidism, unspecified: Secondary | ICD-10-CM | POA: Diagnosis not present

## 2023-09-21 DIAGNOSIS — I150 Renovascular hypertension: Secondary | ICD-10-CM

## 2023-09-21 DIAGNOSIS — S43102D Unspecified dislocation of left acromioclavicular joint, subsequent encounter: Secondary | ICD-10-CM | POA: Diagnosis not present

## 2023-09-21 DIAGNOSIS — Z87442 Personal history of urinary calculi: Secondary | ICD-10-CM

## 2023-09-21 DIAGNOSIS — S43402A Unspecified sprain of left shoulder joint, initial encounter: Secondary | ICD-10-CM | POA: Diagnosis not present

## 2023-09-21 DIAGNOSIS — D649 Anemia, unspecified: Secondary | ICD-10-CM | POA: Diagnosis not present

## 2023-09-21 DIAGNOSIS — K567 Ileus, unspecified: Secondary | ICD-10-CM | POA: Diagnosis present

## 2023-09-21 DIAGNOSIS — I611 Nontraumatic intracerebral hemorrhage in hemisphere, cortical: Secondary | ICD-10-CM | POA: Diagnosis not present

## 2023-09-21 DIAGNOSIS — S134XXA Sprain of ligaments of cervical spine, initial encounter: Secondary | ICD-10-CM | POA: Diagnosis not present

## 2023-09-21 DIAGNOSIS — D509 Iron deficiency anemia, unspecified: Secondary | ICD-10-CM | POA: Diagnosis present

## 2023-09-21 DIAGNOSIS — S066X9A Traumatic subarachnoid hemorrhage with loss of consciousness of unspecified duration, initial encounter: Secondary | ICD-10-CM | POA: Diagnosis not present

## 2023-09-21 DIAGNOSIS — R059 Cough, unspecified: Secondary | ICD-10-CM | POA: Diagnosis not present

## 2023-09-21 DIAGNOSIS — S0003XA Contusion of scalp, initial encounter: Secondary | ICD-10-CM | POA: Diagnosis not present

## 2023-09-21 DIAGNOSIS — R7881 Bacteremia: Secondary | ICD-10-CM | POA: Diagnosis present

## 2023-09-21 DIAGNOSIS — D631 Anemia in chronic kidney disease: Secondary | ICD-10-CM | POA: Diagnosis not present

## 2023-09-21 DIAGNOSIS — S43102A Unspecified dislocation of left acromioclavicular joint, initial encounter: Secondary | ICD-10-CM | POA: Diagnosis present

## 2023-09-21 DIAGNOSIS — S46012D Strain of muscle(s) and tendon(s) of the rotator cuff of left shoulder, subsequent encounter: Secondary | ICD-10-CM | POA: Diagnosis not present

## 2023-09-21 DIAGNOSIS — S3991XA Unspecified injury of abdomen, initial encounter: Secondary | ICD-10-CM | POA: Diagnosis not present

## 2023-09-21 DIAGNOSIS — M25412 Effusion, left shoulder: Secondary | ICD-10-CM | POA: Diagnosis present

## 2023-09-21 DIAGNOSIS — R161 Splenomegaly, not elsewhere classified: Secondary | ICD-10-CM | POA: Diagnosis present

## 2023-09-21 DIAGNOSIS — R001 Bradycardia, unspecified: Secondary | ICD-10-CM | POA: Diagnosis present

## 2023-09-21 DIAGNOSIS — T451X5A Adverse effect of antineoplastic and immunosuppressive drugs, initial encounter: Secondary | ICD-10-CM | POA: Diagnosis not present

## 2023-09-21 DIAGNOSIS — I4819 Other persistent atrial fibrillation: Secondary | ICD-10-CM | POA: Diagnosis not present

## 2023-09-21 DIAGNOSIS — Z8673 Personal history of transient ischemic attack (TIA), and cerebral infarction without residual deficits: Secondary | ICD-10-CM

## 2023-09-21 DIAGNOSIS — R404 Transient alteration of awareness: Secondary | ICD-10-CM | POA: Diagnosis not present

## 2023-09-21 DIAGNOSIS — M1712 Unilateral primary osteoarthritis, left knee: Secondary | ICD-10-CM | POA: Diagnosis not present

## 2023-09-21 LAB — URINALYSIS, ROUTINE W REFLEX MICROSCOPIC
Bacteria, UA: NONE SEEN
Bilirubin Urine: NEGATIVE
Glucose, UA: NEGATIVE mg/dL
Hgb urine dipstick: NEGATIVE
Ketones, ur: NEGATIVE mg/dL
Leukocytes,Ua: NEGATIVE
Nitrite: NEGATIVE
Protein, ur: 100 mg/dL — AB
Specific Gravity, Urine: 1.019 (ref 1.005–1.030)
pH: 5 (ref 5.0–8.0)

## 2023-09-21 LAB — COMPREHENSIVE METABOLIC PANEL WITH GFR
ALT: 17 U/L (ref 0–44)
AST: 16 U/L (ref 15–41)
Albumin: 2.9 g/dL — ABNORMAL LOW (ref 3.5–5.0)
Alkaline Phosphatase: 106 U/L (ref 38–126)
Anion gap: 11 (ref 5–15)
BUN: 63 mg/dL — ABNORMAL HIGH (ref 8–23)
CO2: 20 mmol/L — ABNORMAL LOW (ref 22–32)
Calcium: 7.9 mg/dL — ABNORMAL LOW (ref 8.9–10.3)
Chloride: 110 mmol/L (ref 98–111)
Creatinine, Ser: 3.77 mg/dL — ABNORMAL HIGH (ref 0.61–1.24)
GFR, Estimated: 15 mL/min — ABNORMAL LOW (ref >60)
Glucose, Bld: 108 mg/dL — ABNORMAL HIGH (ref 70–99)
Potassium: 5.2 mmol/L — ABNORMAL HIGH (ref 3.5–5.1)
Sodium: 141 mmol/L (ref 135–145)
Total Bilirubin: 0.7 mg/dL (ref 0.0–1.2)
Total Protein: 5.3 g/dL — ABNORMAL LOW (ref 6.5–8.1)

## 2023-09-21 LAB — I-STAT CHEM 8, ED
BUN: 61 mg/dL — ABNORMAL HIGH (ref 8–23)
Calcium, Ion: 1.06 mmol/L — ABNORMAL LOW (ref 1.15–1.40)
Chloride: 110 mmol/L (ref 98–111)
Creatinine, Ser: 4.1 mg/dL — ABNORMAL HIGH (ref 0.61–1.24)
Glucose, Bld: 102 mg/dL — ABNORMAL HIGH (ref 70–99)
HCT: 26 % — ABNORMAL LOW (ref 39.0–52.0)
Hemoglobin: 8.8 g/dL — ABNORMAL LOW (ref 13.0–17.0)
Potassium: 5.1 mmol/L (ref 3.5–5.1)
Sodium: 141 mmol/L (ref 135–145)
TCO2: 20 mmol/L — ABNORMAL LOW (ref 22–32)

## 2023-09-21 LAB — PROTIME-INR
INR: 1.4 — ABNORMAL HIGH (ref 0.8–1.2)
Prothrombin Time: 17.1 s — ABNORMAL HIGH (ref 11.4–15.2)

## 2023-09-21 LAB — CBC
HCT: 28.8 % — ABNORMAL LOW (ref 39.0–52.0)
Hemoglobin: 8.5 g/dL — ABNORMAL LOW (ref 13.0–17.0)
MCH: 28.3 pg (ref 26.0–34.0)
MCHC: 29.5 g/dL — ABNORMAL LOW (ref 30.0–36.0)
MCV: 96 fL (ref 80.0–100.0)
Platelets: 237 10*3/uL (ref 150–400)
RBC: 3 MIL/uL — ABNORMAL LOW (ref 4.22–5.81)
RDW: 19.5 % — ABNORMAL HIGH (ref 11.5–15.5)
WBC: 3.4 10*3/uL — ABNORMAL LOW (ref 4.0–10.5)
nRBC: 0 % (ref 0.0–0.2)

## 2023-09-21 LAB — I-STAT CG4 LACTIC ACID, ED: Lactic Acid, Venous: 0.6 mmol/L (ref 0.5–1.9)

## 2023-09-21 LAB — ETHANOL: Alcohol, Ethyl (B): <10 mg/dL (ref <10)

## 2023-09-21 LAB — SAMPLE TO BLOOD BANK

## 2023-09-21 LAB — TROPONIN I (HIGH SENSITIVITY)
Troponin I (High Sensitivity): 13 ng/L (ref <18)
Troponin I (High Sensitivity): 12 ng/L (ref <18)

## 2023-09-21 MED ORDER — FUROSEMIDE 40 MG PO TABS
40.0000 mg | ORAL_TABLET | Freq: Every day | ORAL | Status: DC
  Administered 2023-09-22 – 2023-09-23 (×2): 40 mg via ORAL
  Filled 2023-09-21 (×2): qty 2
  Filled 2023-09-21: qty 1

## 2023-09-21 MED ORDER — ACETAMINOPHEN 650 MG RE SUPP: 650.0000 mg | Freq: Four times a day (QID) | RECTAL | Status: DC | PRN

## 2023-09-21 MED ORDER — VANCOMYCIN HCL IN DEXTROSE 1-5 GM/200ML-% IV SOLN
1000.0000 mg | Freq: Once | INTRAVENOUS | Status: AC
  Administered 2023-09-21: 1000 mg via INTRAVENOUS
  Filled 2023-09-21: qty 200

## 2023-09-21 MED ORDER — SODIUM CHLORIDE 0.9 % IV SOLN
2.0000 g | Freq: Once | INTRAVENOUS | Status: AC
  Administered 2023-09-21: 2 g via INTRAVENOUS
  Filled 2023-09-21: qty 12.5

## 2023-09-21 MED ORDER — ACETAMINOPHEN 500 MG PO TABS
500.0000 mg | ORAL_TABLET | Freq: Four times a day (QID) | ORAL | Status: DC | PRN
  Administered 2023-09-22 – 2023-09-25 (×9): 500 mg via ORAL
  Filled 2023-09-21 (×8): qty 1

## 2023-09-21 MED ORDER — MELATONIN 3 MG PO TABS
3.0000 mg | ORAL_TABLET | Freq: Every evening | ORAL | Status: DC | PRN
  Administered 2023-09-22 – 2023-09-24 (×2): 3 mg via ORAL
  Filled 2023-09-21 (×2): qty 1

## 2023-09-21 MED ORDER — ONDANSETRON HCL 4 MG/2ML IJ SOLN
4.0000 mg | Freq: Four times a day (QID) | INTRAMUSCULAR | Status: DC | PRN
  Administered 2023-09-24 – 2023-09-27 (×3): 4 mg via INTRAVENOUS
  Filled 2023-09-21 (×3): qty 2

## 2023-09-21 MED ORDER — SORBITOL 70 % SOLN: 30.0000 mL | Freq: Every day | Status: DC | PRN

## 2023-09-21 MED ORDER — ONDANSETRON HCL 4 MG PO TABS: 4.0000 mg | ORAL_TABLET | Freq: Four times a day (QID) | ORAL | Status: DC | PRN

## 2023-09-21 MED ORDER — LENALIDOMIDE 5 MG PO CAPS: 5.0000 mg | ORAL_CAPSULE | ORAL | Status: DC

## 2023-09-21 MED ORDER — IOHEXOL 350 MG/ML SOLN
75.0000 mL | Freq: Once | INTRAVENOUS | Status: AC | PRN
  Administered 2023-09-21: 75 mL via INTRAVENOUS

## 2023-09-21 MED ORDER — METRONIDAZOLE 500 MG/100ML IV SOLN
500.0000 mg | Freq: Once | INTRAVENOUS | Status: AC
  Administered 2023-09-21: 500 mg via INTRAVENOUS
  Filled 2023-09-21: qty 100

## 2023-09-21 NOTE — Progress Notes (Signed)
 Patient ID: [name], male   DOB: 06-14-1940, 83 y.o.   MRN: [id number] BP (!) 122/48   Pulse 74   Temp (!) 96.6 F (35.9 C) (Temporal)   Resp 17   Ht 5\' 11"  (1.803 m)   Wt 72.6 kg   SpO2 94%   BMI 22.32 kg/m  CT HEAD WO CONTRAST Addendum Date: 09/21/2023 ADDENDUM REPORT: 09/21/2023 16:05 ADDENDUM: These results were called by telephone at the time of interpretation on 09/21/2023 at 4:02 pm to provider TRAVIS YOUNG , who verbally acknowledged these results. Electronically Signed   By: Emily Filbert M.D.   On: 09/21/2023 16:05   Result Date: 09/21/2023 CLINICAL DATA:  Blunt trauma, fall. EXAM: CT HEAD WITHOUT CONTRAST TECHNIQUE: Contiguous axial images were obtained from the base of the skull through the vertex without intravenous contrast. RADIATION DOSE REDUCTION: This exam was performed according to the departmental dose-optimization program which includes automated exposure control, adjustment of the mA and/or kV according to patient size and/or use of iterative reconstruction technique. COMPARISON:  CT head 04/17/2023 FINDINGS: Brain: Focal subarachnoid hemorrhage involving sulci within the left parietal lobe. No additional areas of intracranial hemorrhage noted. No edema or mass effect. No midline shift. No CT evidence of acute infarct. Nonspecific hypoattenuation in the periventricular and subcortical white matter favored to reflect chronic microvascular ischemic changes. The basilar cisterns are patent. Ventricles: Prominence of the ventricles suggesting underlying parenchymal volume loss. Vascular: Atherosclerotic calcifications of the carotid siphons and intracranial vertebral arteries. No hyperdense vessel. Skull: No acute or aggressive finding. Orbits: Bilateral lens replacement. Scleral buckle on the right. Orbits otherwise unremarkable. Sinuses: Mild mucosal thickening in the right maxillary sinus. Other: Mastoid air cells are clear. Left frontal scalp hematoma. Additional soft tissue  swelling in the left parietal scalp. Traumatic Brain Injury Risk Stratification Skull Fracture: No - Low/mBIG 1 Subdural Hematoma (SDH): No - Low Subarachnoid Hemorrhage [facility]): trace (up to 2 sulci) - Low/mBIG 1 Epidural Hematoma (EDH): No - Low/mBIG 1 Cerebral contusion, intra-axial, intraparenchymal Hemorrhage (IPH): No Intraventricular Hemorrhage (IVH): No - Low/mBIG 1 Midline Shift > 1mm or Edema/effacement of sulci/vents: No - Low/mBIG 1 ---------------------------------------------------- IMPRESSION: Focal subarachnoid hemorrhage involving a single sulcus of the left parietal lobe. No edema, mass effect, or midline shift. Left frontoparietal scalp hematomas. Chronic microvascular ischemic changes and mild to moderate parenchymal volume loss. Electronically Signed: By: Emily Filbert M.D. On: 09/21/2023 15:58   CT CERVICAL SPINE WO CONTRAST Result Date: 09/21/2023 CLINICAL DATA:  Poly trauma, blunt.  Status post fall. EXAM: CT CERVICAL SPINE WITHOUT CONTRAST TECHNIQUE: Multidetector CT imaging of the cervical spine was performed without intravenous contrast. Multiplanar CT image reconstructions were also generated. RADIATION DOSE REDUCTION: This exam was performed according to the departmental dose-optimization program which includes automated exposure control, adjustment of the mA and/or kV according to patient size and/or use of iterative reconstruction technique. COMPARISON:  Cervical spine radiographs 09/18/2012. MRI cervical spine 01/14/2011. Head CT and neck CTA 04/12/2022. FINDINGS: Alignment: Straightening of the usual cervical lordosis with a mild convex right scoliosis. 4 mm of anterolisthesis at C5-6, increased from previous cervical MRI. Skull base and vertebrae: There is diffuse spondylosis with severe disc space narrowing and facet arthrosis throughout the cervical spine. There is interfacetal ankylosis bilaterally at C2-3 and on the left at C4-5. Possible interbody ankylosis at those levels as  well. Asymmetric arthrosis involving the right lateral atlantoaxial joint. Suggested nondisplaced fracture of the posterior arch of C1 on the right (best seen  on axial images 24 and 25 of series 4) is grossly unchanged from previous head CT (image 1/5). No other evidence of acute cervical spine fracture or traumatic subluxation. Soft tissues and spinal canal: No prevertebral fluid or swelling. No visible canal hematoma. Disc levels: As alluded to above, there is severe multilevel spondylosis with disc space narrowing, uncinate spurring and facet arthrosis. Findings have progressed from previous MRI and contribute to spinal stenosis and foraminal narrowing at multiple levels. Upper chest: Chest findings dictated separately. Other: Bilateral carotid atherosclerosis. IMPRESSION: 1. No evidence of acute cervical spine fracture or traumatic subluxation. 2. 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 3. No acute soft tissue abnormalities identified. Electronically Signed   By: Elmon Hagedorn M.D.   On: 09/21/2023 15:25   Films reviewed. Head CT shows very small traumatic SAH. There is no mass effect. The basal cisterns are patent. There is no ventricular effacement. The Cerebrum shows significant atrophy which is age appropriate. There are no operative indications. There is no need for repeat scan unless the neurological exam were to deteriorate.

## 2023-09-21 NOTE — H&P (Signed)
 History and Physical    Patient: [name]:[id number] DOB: [birth year] DOA: 09/21/2023 DOS: the patient was seen and examined on 09/21/2023 PCP: [name], DO  Patient coming from: Home  Chief Complaint:  Chief Complaint  Patient presents with   Fall   Level 2    HPI: [name] is a 83 y.o. male with medical history significant for atrial fibrillation on Eliquis. 83 year old reports that he was in his yard.  He remembers walking up a ramp and the next and he knows he is in the hospital.   The patient was found on the ground in his yard.  He denies any dizziness or lightheadedness earlier in the day.  He denies any chest pain or fever.  He says he was feeling fine prior to the fall.  His wife says they are actually packed and were on their way to the beach later today. Patient is on Eliquis he was brought in as a level 2 trauma.  CT scan of the head neck abdomen and pelvis done and the only acute abnormality was a small subarachnoid hemorrhage.  Neurosurgery was called and because the area is small with no mass effect or surrounding edema they recommend no follow-up routine imaging.  Otherwise he is cleared from a trauma perspective. He remained a little drowsy and confused so he will be admitted overnight for observation.  Review of Systems: As mentioned in the history of present illness. All other systems reviewed and are negative. 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   right forearm, [facility]   Stroke (HCC) 11/2007   R brain subcortical infarct   Past Surgical History:  Procedure Laterality Date   ASPIRATION / INJECTION RENAL CYST  07/08/2015   BACK SURGERY     approx 20- 25 years ago   COLONOSCOPY     EYE SURGERY     cataract both eyes   GAS INSERTION  08/11/2011   Procedure: INSERTION OF GAS;  Surgeon: Sherrie George, MD;  Location: [facility];  Service: Ophthalmology;  Laterality: Right;  [id number]   IR BONE MARROW BIOPSY & ASPIRATION  08/03/2023   LEFT HEART CATH AND CORONARY ANGIOGRAPHY N/A 05/02/2023   Procedure: LEFT HEART CATH AND CORONARY ANGIOGRAPHY;  Surgeon: Yvonne Kendall, MD;  Location: [facility] INVASIVE CV LAB;  Service: Cardiovascular;  Laterality: N/A;   REVERSE SHOULDER ARTHROPLASTY Right 04/13/2023   Procedure: REVERSE SHOULDER ARTHROPLASTY;  Surgeon: Christena Flake, MD;  Location: [facility] ORS;  Service: Orthopedics;  Laterality: Right;   SCLERAL BUCKLE  08/11/2011   Procedure: SCLERAL BUCKLE;  Surgeon: Sherrie George, MD;  Location: [facility] OR;  Service: Ophthalmology;  Laterality: Right;   VARICOSE VEIN SURGERY     Social History:  reports that he quit smoking about 13 years ago. His smoking use included cigarettes. He has never used smokeless tobacco. He reports  that he does not drink alcohol and does not use drugs.  Allergies  Allergen Reactions   Meloxicam Nausea And Vomiting    Family History  Problem Relation Age of Onset   Heart disease Father    Dementia Sister    Diabetes Son    Kidney disease Neg Hx    Prostate cancer Neg Hx     Prior to Admission medications   Medication Sig Start Date End Date Taking? Authorizing Provider  acetaminophen (TYLENOL) 500 MG tablet Take 1,000 mg by mouth every 6 (six) hours as needed for mild pain (pain score 1-3).    [provider]  amiodarone (PACERONE) 200 MG tablet Take 200 mg by mouth daily.    [provider]  amLODipine (NORVASC) 10 MG tablet Take 1 tablet  by mouth daily.    [provider]  apixaban (ELIQUIS) 2.5 MG TABS tablet Take 1 tablet (2.5 mg total) by mouth 2 (two) times daily. 06/30/23   Johnson, Megan P, DO  azelastine (ASTELIN) 0.1 % nasal spray Place 1 spray into both nostrils 2 (two) times daily. Use in each nostril as directed Patient taking differently: Place 1 spray into both nostrils 2 (two) times daily as needed for rhinitis or allergies. Use in each nostril as directed 03/28/19   Corbin Dess, PA-C  calcium carbonate (OSCAL) 1500 (600 Ca) MG TABS tablet Take by mouth. 06/08/23   [provider]  Calcium Citrate-Vitamin D (CITRACAL + D PO) Take 1 tablet by mouth in the morning.    [provider]  Cholecalciferol (D3-1000 PO) Take 1,000 Units by mouth in the morning.    [provider]  cyanocobalamin 1000 MCG tablet Take 1,000 mcg by mouth daily.    [provider]  cyclobenzaprine (FLEXERIL) 10 MG tablet TAKE ONE TABLET BY MOUTH AT BEDTIME 08/16/23   Johnson, Megan P, DO  DULoxetine (CYMBALTA) 20 MG capsule Take 1 capsule (20 mg total) by mouth at bedtime. 06/30/23   Johnson, Megan P, DO  furosemide (LASIX) 40 MG tablet Take 1 tablet (40 mg total) by mouth daily. 06/30/23   Johnson, Megan P, DO  gabapentin (NEURONTIN) 300 MG capsule Take 1 capsule (300 mg total) by mouth 2 (two) times daily. Patient taking differently: Take 300 mg by mouth at bedtime. 06/30/23   Terre Ferri P, DO  ketoconazole (NIZORAL) 2 % cream Apply twice daily to affected area on buttocks until clear 03/29/23   Elta Halter, MD  lenalidomide (REVLIMID) 5 MG capsule Take 1 capsule (5 mg total) by mouth daily. Take for 21 days, then hold for 7 days. Repeat every 28 days. 08/30/23   Rao, Archana C, MD  levothyroxine (SYNTHROID) 100 MCG tablet Take 1 tablet (100 mcg total) by mouth daily before breakfast. 08/28/23   Terre Ferri P, DO  lidocaine (LIDODERM) 5 % Place 1 patch onto the skin daily. Remove &  Discard patch within 12 hours or as directed by MD 08/04/23   Terre Ferri P, DO  Lidocaine-Menthol (NERVIVE ROLL-ON EX) Apply 1 application  topically every 4 (four) hours as needed (mild pain).    [provider]  meclizine (ANTIVERT) 25 MG tablet TAKE 1 TABLET BY MOUTH THREE TIMES DAILYAS NEEDED FOR DIZZINESS 02/10/23   Cannady, Jolene T, NP  melatonin 3 MG TABS tablet Take 1 tablet (3 mg total) by mouth at bedtime as needed (insomnia). 07/07/23   Johnson, Megan P, DO  metoprolol succinate (TOPROL-XL) 25 MG 24 hr  tablet Take 25 mg by mouth daily.    [provider]  Multiple Vitamin (MULITIVITAMIN WITH MINERALS) TABS Take 1 tablet by mouth in the morning.    [provider]  pantoprazole (PROTONIX) 40 MG tablet Take 1 tablet (40 mg total) by mouth daily. 06/30/23   Johnson, Megan P, DO  predniSONE (DELTASONE) 10 MG tablet Take by mouth. 06/08/23   [provider]  QUEtiapine (SEROQUEL) 25 MG tablet TAKE ONE TABLET BY MOUTH AT BEDTIME 08/28/23   Johnson, Megan P, DO  simvastatin (ZOCOR) 40 MG tablet TAKE ONE TABLET BY MOUTH EVERY DAY 05/12/23   Johnson, Megan P, DO  tamsulosin (FLOMAX) 0.4 MG CAPS capsule Take 2 capsules (0.8 mg total) by mouth every evening. 06/30/23   Johnson, Megan P, DO  traMADol (ULTRAM) 50 MG tablet Take by mouth.    [provider]  zinc gluconate 50 MG tablet Take 50 mg by mouth in the morning.    [provider]    Physical Exam: Vitals:   09/21/23 1715 09/21/23 1810 09/21/23 1845 09/21/23 1900  BP: 129/69  (!) 113/57 (!) 123/58  Pulse: (!) 58  (!) 57 60  Resp: 14  13 14   Temp:  98.2 F (36.8 C)    TempSrc:  Rectal    SpO2: 99%  99% 100%  Weight:      Height:       Physical Exam:  General: No acute distress, well developed, well nourished HEENT: Dried blood on the left forehead, P surgically irregular bilaterally Cardiovascular: Normal rhythm.  Bradycardia. Distal pulses intact. Pulmonary: Normal pulmonary  effort, normal breath sounds Gastrointestinal: Distended abdomen, RUQ tender, large RUQ mass (liver?) palpated and tender,  normoactive bowel sounds, no organomegaly Musculoskeletal:Normal ROM, no lower ext edema Lymphadenopathy: No cervical LAD. Skin: Skin is warm and dry. Neuro: He seems to have trouble initiating movement.  No tremor. He is very slow to follow commands and has to be asked multiple times.  He is complaining of significant discomfort from the c-collar so it is not clear if that is hampering his movement. He is hard of hearing.   PSYCH: Drowsy, cooperative  Data Reviewed:  Results for orders placed or performed during the hospital encounter of 09/21/23 (from the past 24 hours)  Troponin I (High Sensitivity)     Status: None   Collection Time: 09/21/23  2:32 PM  Result Value Ref Range   Troponin I (High Sensitivity) 13 <18 ng/L  Comprehensive metabolic panel     Status: Abnormal   Collection Time: 09/21/23  2:32 PM  Result Value Ref Range   Sodium 141 135 - 145 mmol/L   Potassium 5.2 (H) 3.5 - 5.1 mmol/L   Chloride 110 98 - 111 mmol/L   CO2 20 (L) 22 - 32 mmol/L   Glucose, Bld 108 (H) 70 - 99 mg/dL   BUN 63 (H) 8 - 23 mg/dL   Creatinine, Ser 1.61 (H) 0.61 - 1.24 mg/dL   Calcium 7.9 (L) 8.9 - 10.3 mg/dL   Total Protein 5.3 (L) 6.5 - 8.1 g/dL   Albumin 2.9 (L) 3.5 - 5.0 g/dL   AST 16 15 - 41 U/L   ALT 17 0 - 44 U/L   Alkaline Phosphatase 106 38 - 126 U/L   Total Bilirubin 0.7 0.0 - 1.2 mg/dL   GFR, Estimated 15 (L) >60 mL/min   Anion gap 11 5 - 15  CBC     Status: Abnormal   Collection  Time: 09/21/23  2:32 PM  Result Value Ref Range   WBC 3.4 (L) 4.0 - 10.5 K/uL   RBC 3.00 (L) 4.22 - 5.81 MIL/uL   Hemoglobin 8.5 (L) 13.0 - 17.0 g/dL   HCT 86.5 (L) 78.4 - 69.6 %   MCV 96.0 80.0 - 100.0 fL   MCH 28.3 26.0 - 34.0 pg   MCHC 29.5 (L) 30.0 - 36.0 g/dL   RDW 29.5 (H) 28.4 - 13.2 %   Platelets 237 150 - 400 K/uL   nRBC 0.0 0.0 - 0.2 %  Ethanol     Status: None    Collection Time: 09/21/23  2:32 PM  Result Value Ref Range   Alcohol, Ethyl (B) <10 <10 mg/dL  Protime-INR     Status: Abnormal   Collection Time: 09/21/23  2:32 PM  Result Value Ref Range   Prothrombin Time 17.1 (H) 11.4 - 15.2 seconds   INR 1.4 (H) 0.8 - 1.2  Sample to Blood Bank     Status: None   Collection Time: 09/21/23  2:32 PM  Result Value Ref Range   Blood Bank Specimen SAMPLE AVAILABLE FOR TESTING    Sample Expiration      09/24/2023,2359 Performed at [facility], [address], [place], [place] [address]   I-Stat Chem 8, ED     Status: Abnormal   Collection Time: 09/21/23  2:34 PM  Result Value Ref Range   Sodium 141 135 - 145 mmol/L   Potassium 5.1 3.5 - 5.1 mmol/L   Chloride 110 98 - 111 mmol/L   BUN 61 (H) 8 - 23 mg/dL   Creatinine, Ser 2.72 (H) 0.61 - 1.24 mg/dL   Glucose, Bld 536 (H) 70 - 99 mg/dL   Calcium, Ion 6.44 (L) 1.15 - 1.40 mmol/L   TCO2 20 (L) 22 - 32 mmol/L   Hemoglobin 8.8 (L) 13.0 - 17.0 g/dL   HCT 03.4 (L) 74.2 - 59.5 %  I-Stat Lactic Acid, ED     Status: None   Collection Time: 09/21/23  2:34 PM  Result Value Ref Range   Lactic Acid, Venous 0.6 0.5 - 1.9 mmol/L  Troponin I (High Sensitivity)     Status: None   Collection Time: 09/21/23  5:00 PM  Result Value Ref Range   Troponin I (High Sensitivity) 12 <18 ng/L  Urinalysis, Routine w reflex microscopic -Urine, Clean Catch     Status: Abnormal   Collection Time: 09/21/23  6:14 PM  Result Value Ref Range   Color, Urine YELLOW YELLOW   APPearance CLEAR CLEAR   Specific Gravity, Urine 1.019 1.005 - 1.030   pH 5.0 5.0 - 8.0   Glucose, UA NEGATIVE NEGATIVE mg/dL   Hgb urine dipstick NEGATIVE NEGATIVE   Bilirubin Urine NEGATIVE NEGATIVE   Ketones, ur NEGATIVE NEGATIVE mg/dL   Protein, ur 638 (A) NEGATIVE mg/dL   Nitrite NEGATIVE NEGATIVE   Leukocytes,Ua NEGATIVE NEGATIVE   RBC / HPF 0-5 0 - 5 RBC/hpf   WBC, UA 0-5 0 - 5 WBC/hpf   Bacteria, UA NONE SEEN NONE SEEN   Squamous  Epithelial / HPF 0-5 0 - 5 /HPF   *Note: Due to a large number of results and/or encounters for the requested time period, some results have not been displayed. A complete set of results can be found in Results Review.     Assessment and Plan: Small subarachnoid hemorrhage due to fall -  - Appreciate neurosurgery recommendations.  Will monitor for  any change in neurologic examination.  No repeat imaging is needed unless there is deterioration. - Discontinue Eliquis - PT/OT  2.  Acute encephalopathy - the patient has been slightly confused and more drowsy here in the emergency department.  Will continue to monitor.  3.  Large right pleural effusion -the patient is lying flat on the cot in the ER with a c-collar on at the time of my evaluation.  He is not short of breath has not had significant short of breath in the outpatient setting. - Continue oral Lasix at this time - Consider thoracentesis due to the size of the effusion.  4. Afib - Eliquis will be discontinued  4.  Myelofibrosis and splenomegaly -continue outpatient oral chemo medication.   5.  CKD - Continue to monitor  6.  Hyperkalemia - monitor    Advance Care Planning:   Code Status: Full Code  The patient's wife says she is his healthcare power of attorney and says they have completed advanced directives and he wants to be full code.  Consults: Neurosurgery  Family Communication: Patient's wife and son are at bedside  Severity of Illness: The appropriate patient status for this patient is INPATIENT. Inpatient status is judged to be reasonable and necessary in order to provide the required intensity of service to ensure the patient's safety. The patient's presenting symptoms, physical exam findings, and initial radiographic and laboratory data in the context of their chronic comorbidities is felt to place them at high risk for further clinical deterioration. Furthermore, it is not anticipated that the patient will be  medically stable for discharge from the hospital within 2 midnights of admission.   * I certify that at the point of admission it is my clinical judgment that the patient will require inpatient hospital care spanning beyond 2 midnights from the point of admission due to high intensity of service, high risk for further deterioration and high frequency of surveillance required.*  Author: Willadean Hark, MD 09/21/2023 7:46 PM  For on call review [email or web address].

## 2023-09-21 NOTE — ED Provider Notes (Signed)
 [facility]ERG[facility] DEPARTMENT AT [facility] Provider Note   CSN: [id number] Arrival date & time: 09/21/23  1402     History  Chief Complaint  Patient presents with   Fall   Level 2     [name] is a 83 y.o. male.  This is an 83 year old male on Eliquis presenting emergency department after a fall.  He is on Eliquis.  Has obvious abrasions to his head.  He arrives confused, reported by EMS to be ANO x 4.  He knows self.  Notes pain to his right ribs.  Does not recall the accident.  Denies chest pain, shortness of breath, abdominal pain.   Fall       Home Medications Prior to Admission medications   Medication Sig Start Date End Date Taking? Authorizing Provider  acetaminophen (TYLENOL) 500 MG tablet Take 1,000 mg by mouth every 6 (six) hours as needed for mild pain (pain score 1-3).    [provider]  amiodarone (PACERONE) 200 MG tablet Take 200 mg by mouth daily.    [provider]  amLODipine (NORVASC) 10 MG tablet Take 1 tablet by mouth daily.    [provider]  apixaban (ELIQUIS) 2.5 MG TABS tablet Take 1 tablet (2.5 mg total) by mouth 2 (two) times daily. 06/30/23   Johnson, Megan P, DO  azelastine (ASTELIN) 0.1 % nasal spray Place 1 spray into both nostrils 2 (two) times daily. Use in each nostril as directed Patient taking differently: Place 1 spray into both nostrils 2 (two) times daily as needed for rhinitis or allergies. Use in each nostril as directed 03/28/19   Particia Nearing, PA-C  calcium carbonate (OSCAL) 1500 (600 Ca) MG TABS tablet Take by mouth. 06/08/23   [provider]  Calcium Citrate-Vitamin D (CITRACAL + D PO) Take 1 tablet by mouth in the morning.    [provider]  Cholecalciferol (D3-1000 PO) Take 1,000 Units by mouth in the morning.    [provider]  cyanocobalamin 1000 MCG tablet Take 1,000 mcg by mouth daily.    [provider]  cyclobenzaprine (FLEXERIL) 10  MG tablet TAKE ONE TABLET BY MOUTH AT BEDTIME 08/16/23   Johnson, Megan P, DO  DULoxetine (CYMBALTA) 20 MG capsule Take 1 capsule (20 mg total) by mouth at bedtime. 06/30/23   Johnson, Megan P, DO  furosemide (LASIX) 40 MG tablet Take 1 tablet (40 mg total) by mouth daily. 06/30/23   Johnson, Megan P, DO  gabapentin (NEURONTIN) 300 MG capsule Take 1 capsule (300 mg total) by mouth 2 (two) times daily. Patient taking differently: Take 300 mg by mouth at bedtime. 06/30/23   Olevia Perches P, DO  ketoconazole (NIZORAL) 2 % cream Apply twice daily to affected area on buttocks until clear 03/29/23   Deirdre Evener, MD  lenalidomide (REVLIMID) 5 MG capsule Take 1 capsule (5 mg total) by mouth daily. Take for 21 days, then hold for 7 days. Repeat every 28 days. 08/30/23   Creig Hines, MD  levothyroxine (SYNTHROID) 100 MCG tablet Take 1 tablet (100 mcg total) by mouth daily before breakfast. 08/28/23   Olevia Perches P, DO  lidocaine (LIDODERM) 5 % Place 1 patch onto the skin daily. Remove & Discard patch within 12 hours or as directed by MD 08/04/23   Olevia Perches P, DO  Lidocaine-Menthol (NERVIVE ROLL-ON EX) Apply 1 application  topically every 4 (four) hours as needed (mild pain).    [provider]  meclizine (ANTIVERT) 25 MG tablet TAKE 1 TABLET BY MOUTH THREE TIMES DAILYAS NEEDED FOR DIZZINESS 02/10/23   Cannady, Jolene T, NP  melatonin 3 MG TABS tablet Take 1 tablet (3 mg total) by mouth at bedtime as needed (insomnia). 07/07/23   Johnson, Megan P, DO  metoprolol succinate (TOPROL-XL) 25 MG 24 hr tablet Take 25 mg by mouth daily.    [provider]  Multiple Vitamin (MULITIVITAMIN WITH MINERALS) TABS Take 1 tablet by mouth in the morning.    [provider]  pantoprazole (PROTONIX) 40 MG tablet Take 1 tablet (40 mg total) by mouth daily. 06/30/23   Johnson, Megan P, DO  predniSONE (DELTASONE) 10 MG tablet Take by mouth. 06/08/23   [provider]  QUEtiapine (SEROQUEL)  25 MG tablet TAKE ONE TABLET BY MOUTH AT BEDTIME 08/28/23   Johnson, Megan P, DO  simvastatin (ZOCOR) 40 MG tablet TAKE ONE TABLET BY MOUTH EVERY DAY 05/12/23   Johnson, Megan P, DO  tamsulosin (FLOMAX) 0.4 MG CAPS capsule Take 2 capsules (0.8 mg total) by mouth every evening. 06/30/23   Johnson, Megan P, DO  traMADol (ULTRAM) 50 MG tablet Take by mouth.    [provider]  zinc gluconate 50 MG tablet Take 50 mg by mouth in the morning.    [provider]      Allergies    Meloxicam    Review of Systems   Review of Systems  Physical Exam Updated Vital Signs BP (!) 122/48   Pulse 74   Temp (!) 96.6 F (35.9 C) (Temporal)   Resp 17   Ht 5\' 11"  (1.803 m)   Wt 72.6 kg   SpO2 94%   BMI 22.32 kg/m  Physical Exam Vitals and nursing note reviewed.  Constitutional:      General: He is not in acute distress.    Appearance: He is not toxic-appearing.  HENT:     Head: Normocephalic.     Comments: Patient with superficial abrasions to his right eyebrow/temple area.    Nose: Nose normal.     Mouth/Throat:     Mouth: Mucous membranes are moist.  Eyes:     Conjunctiva/sclera: Conjunctivae normal.     Pupils: Pupils are equal, round, and reactive to light.  Cardiovascular:     Rate and Rhythm: Normal rate and regular rhythm.  Pulmonary:     Effort: Pulmonary effort is normal.     Breath sounds: Normal breath sounds.  Abdominal:     General: Abdomen is flat. There is no distension.     Palpations: Abdomen is soft.     Tenderness: There is no abdominal tenderness. There is no guarding or rebound.  Musculoskeletal:        General: Normal range of motion.  Skin:    General: Skin is warm and dry.     Capillary Refill: Capillary refill takes less than 2 seconds.  Neurological:     Mental Status: He is alert and oriented to person, place, and time.  Psychiatric:        Mood and Affect: Mood normal.        Behavior: Behavior normal.     ED Results / Procedures /  Treatments   Labs (all labs ordered are listed, but only abnormal results are displayed) Labs Reviewed  COMPREHENSIVE METABOLIC PANEL WITH GFR - Abnormal; Notable for the following components:      Result Value   Potassium 5.2 (*)  CO2 20 (*)    Glucose, Bld 108 (*)    BUN 63 (*)    Creatinine, Ser 3.77 (*)    Calcium 7.9 (*)    Total Protein 5.3 (*)    Albumin 2.9 (*)    GFR, Estimated 15 (*)    All other components within normal limits  CBC - Abnormal; Notable for the following components:   WBC 3.4 (*)    RBC 3.00 (*)    Hemoglobin 8.5 (*)    HCT 28.8 (*)    MCHC 29.5 (*)    RDW 19.5 (*)    All other components within normal limits  PROTIME-INR - Abnormal; Notable for the following components:   Prothrombin Time 17.1 (*)    INR 1.4 (*)    All other components within normal limits  I-STAT CHEM 8, ED - Abnormal; Notable for the following components:   BUN 61 (*)    Creatinine, Ser 4.10 (*)    Glucose, Bld 102 (*)    Calcium, Ion 1.06 (*)    TCO2 20 (*)    Hemoglobin 8.8 (*)    HCT 26.0 (*)    All other components within normal limits  CULTURE, BLOOD (ROUTINE X 2)  CULTURE, BLOOD (ROUTINE X 2)  ETHANOL  URINALYSIS, ROUTINE W REFLEX MICROSCOPIC  I-STAT CG4 LACTIC ACID, ED  SAMPLE TO BLOOD BANK  TROPONIN I (HIGH SENSITIVITY)  TROPONIN I (HIGH SENSITIVITY)    EKG None  Radiology CT HEAD WO CONTRAST Addendum Date: 09/21/2023 ADDENDUM REPORT: 09/21/2023 16:05 ADDENDUM: These results were called by telephone at the time of interpretation on 09/21/2023 at 4:02 pm to provider Cheston Coury , who verbally acknowledged these results. Electronically Signed   By: Denny Flack M.D.   On: 09/21/2023 16:05   Result Date: 09/21/2023 CLINICAL DATA:  Blunt trauma, fall. EXAM: CT HEAD WITHOUT CONTRAST TECHNIQUE: Contiguous axial images were obtained from the base of the skull through the vertex without intravenous contrast. RADIATION DOSE REDUCTION: This exam was performed  according to the departmental dose-optimization program which includes automated exposure control, adjustment of the mA and/or kV according to patient size and/or use of iterative reconstruction technique. COMPARISON:  CT head 04/17/2023 FINDINGS: Brain: Focal subarachnoid hemorrhage involving sulci within the left parietal lobe. No additional areas of intracranial hemorrhage noted. No edema or mass effect. No midline shift. No CT evidence of acute infarct. Nonspecific hypoattenuation in the periventricular and subcortical white matter favored to reflect chronic microvascular ischemic changes. The basilar cisterns are patent. Ventricles: Prominence of the ventricles suggesting underlying parenchymal volume loss. Vascular: Atherosclerotic calcifications of the carotid siphons and intracranial vertebral arteries. No hyperdense vessel. Skull: No acute or aggressive finding. Orbits: Bilateral lens replacement. Scleral buckle on the right. Orbits otherwise unremarkable. Sinuses: Mild mucosal thickening in the right maxillary sinus. Other: Mastoid air cells are clear. Left frontal scalp hematoma. Additional soft tissue swelling in the left parietal scalp. Traumatic Brain Injury Risk Stratification Skull Fracture: No - Low/mBIG 1 Subdural Hematoma (SDH): No - Low Subarachnoid Hemorrhage [facility]): trace (up to 2 sulci) - Low/mBIG 1 Epidural Hematoma (EDH): No - Low/mBIG 1 Cerebral contusion, intra-axial, intraparenchymal Hemorrhage (IPH): No Intraventricular Hemorrhage (IVH): No - Low/mBIG 1 Midline Shift > 1mm or Edema/effacement of sulci/vents: No - Low/mBIG 1 ---------------------------------------------------- IMPRESSION: Focal subarachnoid hemorrhage involving a single sulcus of the left parietal lobe. No edema, mass effect, or midline shift. Left frontoparietal scalp hematomas. Chronic microvascular ischemic changes and mild to moderate parenchymal volume  loss. Electronically Signed: By: Denny Flack M.D. On: 09/21/2023  15:58   CT CERVICAL SPINE WO CONTRAST Result Date: 09/21/2023 CLINICAL DATA:  Poly trauma, blunt.  Status post fall. EXAM: CT CERVICAL SPINE WITHOUT CONTRAST TECHNIQUE: Multidetector CT imaging of the cervical spine was performed without intravenous contrast. Multiplanar CT image reconstructions were also generated. RADIATION DOSE REDUCTION: This exam was performed according to the departmental dose-optimization program which includes automated exposure control, adjustment of the mA and/or kV according to patient size and/or use of iterative reconstruction technique. COMPARISON:  Cervical spine radiographs 09/18/2012. MRI cervical spine 01/14/2011. Head CT and neck CTA 04/12/2022. FINDINGS: Alignment: Straightening of the usual cervical lordosis with a mild convex right scoliosis. 4 mm of anterolisthesis at C5-6, increased from previous cervical MRI. Skull base and vertebrae: There is diffuse spondylosis with severe disc space narrowing and facet arthrosis throughout the cervical spine. There is interfacetal ankylosis bilaterally at C2-3 and on the left at C4-5. Possible interbody ankylosis at those levels as well. Asymmetric arthrosis involving the right lateral atlantoaxial joint. Suggested nondisplaced fracture of the posterior arch of C1 on the right (best seen on axial images 24 and 25 of series 4) is grossly unchanged from previous head CT (image 1/5). No other evidence of acute cervical spine fracture or traumatic subluxation. Soft tissues and spinal canal: No prevertebral fluid or swelling. No visible canal hematoma. Disc levels: As alluded to above, there is severe multilevel spondylosis with disc space narrowing, uncinate spurring and facet arthrosis. Findings have progressed from previous MRI and contribute to spinal stenosis and foraminal narrowing at multiple levels. Upper chest: Chest findings dictated separately. Other: Bilateral carotid atherosclerosis. IMPRESSION: 1. No evidence of acute cervical  spine fracture or traumatic subluxation. 2. 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 3. No acute soft tissue abnormalities identified. Electronically Signed   By: Elmon Hagedorn M.D.   On: 09/21/2023 15:25    Procedures Procedures    Medications Ordered in ED Medications  ceFEPIme (MAXIPIME) 2 g in sodium chloride 0.9 % 100 mL IVPB (has no administration in time range)  vancomycin (VANCOCIN) IVPB 1000 mg/200 mL premix (has no administration in time range)  metroNIDAZOLE (FLAGYL) IVPB 500 mg (has no administration in time range)  iohexol (OMNIPAQUE) 350 MG/ML injection 75 mL (75 mLs Intravenous Contrast Given 09/21/23 1501)    ED Course/ Medical Decision Making/ A&P Clinical Course as of 09/21/23 1633  Thu Sep 21, 2023  1444 DG Chest [place] 1 View Appears to have new right-sided effusion. [TY]  1447 Benefits of contrast outweigh the risk of kidney injury given patient's kidney numbers.   [TY]  1523 Lactic Acid, Venous: 0.6 [TY]  1529 CBC(!) He is leukopenic and anemic.  Appears to be near his baseline. [TY]  1530 Creatinine(!): 4.10 Appears baseline is around 3 [TY]  1604 CT HEAD WO CONTRAST IMPRESSION: Focal subarachnoid hemorrhage involving a single sulcus of the left parietal lobe. No edema, mass effect, or midline shift.  Left frontoparietal scalp hematomas.  Chronic microvascular ischemic changes and mild to moderate parenchymal volume loss.   Electronically Signed   By: Denny Flack M.D.   On: 09/21/2023 15:58   [TY]    Clinical Course User Index [TY] Rolinda Climes, DO                                 Medical Decision Making This is  an 83 year old male presenting emergency department as a trauma activation with fall/head trauma on Eliquis.  He is afebrile, hemodynamically stable.  Borderline hypothermic, maintaining oxygen saturation room air.  Does have some abrasions to his left forehead and is complaining of some right  chest pain, but no other overt signs of injury on exam.  He is globally weak on exam and I do not appreciate localizing deficit.  He is confused and does not know events surrounding what happened.  Per chart reviewed is on Eliquis does have some centrally acting medications Flexeril, gabapentin meclizine Seroquel and tramadol on his medication list which could explain encephalopathy.  Chest x-ray appears to have right-sided effusion versus contusion.  Given patient is an unreliable historian and it is unclear exactly how he fell we will get CT head, C-spine chest abdomen pelvis.  Will also get labs to evaluate for syncope/metabolic derangements.  Anticipate admission for encephalopathy.  CT head resulted during signout with small subarachnoid hemorrhage which I suspect is traumatic given location of his abrasions on exam.  Amount and/or Complexity of Data Reviewed Independent Historian:     Details: Family patient normal around noon.  They also note history of pleural effusion on the right with prior thoracentesis roughly 1 point liter taken out in December per their report. External Data Reviewed:     Details: See above Labs: ordered. Decision-making details documented in ED Course.    Details: Mildly elevated potassium.  BUN and creatinine appear to be near baseline.  No transaminitis to suggest hepatobiliary disease.  Troponin negative. Radiology: ordered. Decision-making details documented in ED Course. ECG/medicine tests: ordered. Discussion of management or test interpretation with external provider(s): Care signed out to Dr. Nolia Baumgartner afternoon emergency provider.  Risk Prescription drug management.         Final Clinical Impression(s) / ED Diagnoses Final diagnoses:  None    Rx / DC Orders ED Discharge Orders     None         Rolinda Climes, DO 09/21/23 1633

## 2023-09-21 NOTE — ED Notes (Signed)
 Attempted to draw blood cultures twice, phlebotomy will try and collect them.

## 2023-09-21 NOTE — ED Triage Notes (Signed)
 Patient bib EMS after a fall in his backyard. Family said he was outside for about 10 minutes. Baseline patient is A&Ox4 currently A&Ox1. EMS reports GCS of 11. Pt on eliquis, has a wound on left forehead above the eye.

## 2023-09-21 NOTE — ED Notes (Signed)
 Per Dr. Linder Revere, OK to use contrast, reduced dose - per lab results.  V.O. Dr. Peola Boyden. RN Trauma Response Nurse

## 2023-09-21 NOTE — ED Provider Notes (Signed)
 Care was taken over from Dr. Linder Revere.  Patient was found outside on the ground with some head trauma.  Came in as a level 2 trauma due to fall on Eliquis.  His head CT shows a small subarachnoid hemorrhage.  [name] has evaluated the patient and does not feel that anything else needs to be done regarding this.  His other imaging shows a large right pleural effusion which has reaccumulated.  Initially he was noted to be hypothermic.  Given this, decision was made to start antibiotics for possible sepsis.  However later found out that this was a temp oral temperature.  Rectal temperature was normal.  Will need be admitted for his confusion and pleural effusion.  Discussed with the hospitalist who will admit the patient for further treatment.   Hershel Los, MD 09/21/23 1840

## 2023-09-21 NOTE — Progress Notes (Signed)
 Orthopedic Tech Progress Note Patient Details:  [name] 1940/11/20 [id number]  Patient ID: [name], male   DOB: 10/09/1940, 83 y.o.   MRN: [id number] Level 2 trauma. [name] 09/21/2023, 2:37 PM

## 2023-09-21 NOTE — Progress Notes (Signed)
 Pt experienced fall. Chaplain provided emotional and spiritual support.  Will follow as needed.  Anton Baton, [place], [facility], Pager [phone]

## 2023-09-22 ENCOUNTER — Inpatient Hospital Stay (HOSPITAL_COMMUNITY)

## 2023-09-22 ENCOUNTER — Encounter (HOSPITAL_COMMUNITY): Payer: Self-pay | Admitting: Radiology

## 2023-09-22 DIAGNOSIS — S066X9D Traumatic subarachnoid hemorrhage with loss of consciousness of unspecified duration, subsequent encounter: Secondary | ICD-10-CM

## 2023-09-22 DIAGNOSIS — M25512 Pain in left shoulder: Secondary | ICD-10-CM

## 2023-09-22 DIAGNOSIS — N281 Cyst of kidney, acquired: Secondary | ICD-10-CM | POA: Diagnosis not present

## 2023-09-22 DIAGNOSIS — N401 Enlarged prostate with lower urinary tract symptoms: Secondary | ICD-10-CM | POA: Diagnosis not present

## 2023-09-22 DIAGNOSIS — J90 Pleural effusion, not elsewhere classified: Secondary | ICD-10-CM

## 2023-09-22 HISTORY — PX: IR THORACENTESIS ASP PLEURAL SPACE W/IMG GUIDE: IMG5380

## 2023-09-22 LAB — CBC
HCT: 30.7 % — ABNORMAL LOW (ref 39.0–52.0)
Hemoglobin: 9.1 g/dL — ABNORMAL LOW (ref 13.0–17.0)
MCH: 27.7 pg (ref 26.0–34.0)
MCHC: 29.6 g/dL — ABNORMAL LOW (ref 30.0–36.0)
MCV: 93.6 fL (ref 80.0–100.0)
Platelets: 250 10*3/uL (ref 150–400)
RBC: 3.28 MIL/uL — ABNORMAL LOW (ref 4.22–5.81)
RDW: 19.5 % — ABNORMAL HIGH (ref 11.5–15.5)
WBC: 3.7 10*3/uL — ABNORMAL LOW (ref 4.0–10.5)
nRBC: 0 % (ref 0.0–0.2)

## 2023-09-22 LAB — GRAM STAIN

## 2023-09-22 LAB — BODY FLUID CELL COUNT WITH DIFFERENTIAL
Eos, Fluid: 0 %
Lymphs, Fluid: 57 %
Monocyte-Macrophage-Serous Fluid: 38 % — ABNORMAL LOW (ref 50–90)
Neutrophil Count, Fluid: 5 % (ref 0–25)
Total Nucleated Cell Count, Fluid: 269 uL (ref 0–1000)

## 2023-09-22 LAB — PROTEIN, PLEURAL OR PERITONEAL FLUID: Total protein, fluid: 3.3 g/dL

## 2023-09-22 LAB — COMPREHENSIVE METABOLIC PANEL WITH GFR
ALT: 18 U/L (ref 0–44)
AST: 17 U/L (ref 15–41)
Albumin: 2.9 g/dL — ABNORMAL LOW (ref 3.5–5.0)
Alkaline Phosphatase: 103 U/L (ref 38–126)
Anion gap: 9 (ref 5–15)
BUN: 56 mg/dL — ABNORMAL HIGH (ref 8–23)
CO2: 21 mmol/L — ABNORMAL LOW (ref 22–32)
Calcium: 8.2 mg/dL — ABNORMAL LOW (ref 8.9–10.3)
Chloride: 110 mmol/L (ref 98–111)
Creatinine, Ser: 3.48 mg/dL — ABNORMAL HIGH (ref 0.61–1.24)
GFR, Estimated: 17 mL/min — ABNORMAL LOW (ref >60)
Glucose, Bld: 100 mg/dL — ABNORMAL HIGH (ref 70–99)
Potassium: 4.8 mmol/L (ref 3.5–5.1)
Sodium: 140 mmol/L (ref 135–145)
Total Bilirubin: 0.7 mg/dL (ref 0.0–1.2)
Total Protein: 5.6 g/dL — ABNORMAL LOW (ref 6.5–8.1)

## 2023-09-22 LAB — TSH: TSH: 30.262 u[IU]/mL — ABNORMAL HIGH (ref 0.350–4.500)

## 2023-09-22 LAB — GLUCOSE, CAPILLARY: Glucose-Capillary: 103 mg/dL — ABNORMAL HIGH (ref 70–99)

## 2023-09-22 LAB — MAGNESIUM: Magnesium: 1.6 mg/dL — ABNORMAL LOW (ref 1.7–2.4)

## 2023-09-22 LAB — LACTATE DEHYDROGENASE, PLEURAL OR PERITONEAL FLUID: LD, Fluid: 230 U/L — ABNORMAL HIGH (ref 3–23)

## 2023-09-22 LAB — T4, FREE: Free T4: 0.55 ng/dL — ABNORMAL LOW (ref 0.61–1.12)

## 2023-09-22 MED ORDER — SIMVASTATIN 20 MG PO TABS
40.0000 mg | ORAL_TABLET | Freq: Every day | ORAL | Status: DC
  Administered 2023-09-22 – 2023-09-28 (×7): 40 mg via ORAL
  Filled 2023-09-22 (×7): qty 2

## 2023-09-22 MED ORDER — METOPROLOL SUCCINATE ER 25 MG PO TB24
25.0000 mg | ORAL_TABLET | Freq: Every day | ORAL | Status: DC
  Administered 2023-09-22: 25 mg via ORAL
  Filled 2023-09-22 (×2): qty 1

## 2023-09-22 MED ORDER — LEVOTHYROXINE SODIUM 75 MCG PO TABS
75.0000 ug | ORAL_TABLET | Freq: Every day | ORAL | Status: DC
  Administered 2023-09-22 – 2023-09-23 (×2): 75 ug via ORAL
  Filled 2023-09-22 (×2): qty 1

## 2023-09-22 MED ORDER — AMLODIPINE BESYLATE 5 MG PO TABS
10.0000 mg | ORAL_TABLET | Freq: Every day | ORAL | Status: DC
  Administered 2023-09-22: 10 mg via ORAL
  Filled 2023-09-22 (×2): qty 2

## 2023-09-22 MED ORDER — DULOXETINE HCL 20 MG PO CPEP
20.0000 mg | ORAL_CAPSULE | Freq: Every day | ORAL | Status: DC
  Administered 2023-09-22 – 2023-09-28 (×7): 20 mg via ORAL
  Filled 2023-09-22 (×7): qty 1

## 2023-09-22 MED ORDER — TAMSULOSIN HCL 0.4 MG PO CAPS
0.8000 mg | ORAL_CAPSULE | Freq: Every evening | ORAL | Status: DC
  Administered 2023-09-22: 0.8 mg via ORAL
  Filled 2023-09-22: qty 2

## 2023-09-22 MED ORDER — PANTOPRAZOLE SODIUM 40 MG PO TBEC
40.0000 mg | DELAYED_RELEASE_TABLET | Freq: Every day | ORAL | Status: DC
  Administered 2023-09-22 – 2023-09-26 (×5): 40 mg via ORAL
  Filled 2023-09-22 (×5): qty 1

## 2023-09-22 MED ORDER — LIDOCAINE HCL 1 % IJ SOLN
INTRAMUSCULAR | Status: AC
  Filled 2023-09-22: qty 20

## 2023-09-22 MED ORDER — QUETIAPINE FUMARATE 25 MG PO TABS
25.0000 mg | ORAL_TABLET | Freq: Every day | ORAL | Status: DC
  Administered 2023-09-22 – 2023-09-28 (×7): 25 mg via ORAL
  Filled 2023-09-22 (×7): qty 1

## 2023-09-22 MED ORDER — AMIODARONE HCL 200 MG PO TABS
200.0000 mg | ORAL_TABLET | Freq: Every day | ORAL | Status: DC
  Administered 2023-09-22 – 2023-09-29 (×8): 200 mg via ORAL
  Filled 2023-09-22 (×8): qty 1

## 2023-09-22 MED ORDER — LIDOCAINE HCL 1 % IJ SOLN
20.0000 mL | Freq: Once | INTRAMUSCULAR | Status: AC
  Administered 2023-09-22: 10 mL

## 2023-09-22 NOTE — Progress Notes (Signed)
 [facility]                                                                               [name], is a 83 y.o. male, DOB - 26-Dec-1940, WGN:[id number] Admit date - 09/21/2023    Outpatient Primary MD for the patient is [name], DO  LOS - 1  days    Brief summary    Christian DELAUGHTER is a 83 y.o. male with medical history significant for atrial fibrillation on Eliquis . 83 year old reports that he was in his yard.  He remembers walking up a ramp and the next and he knows he is in the hospital.   The patient was found on the ground in his yard.   He says he was feeling fine prior to the fall.  His wife says they are actually packed and were on their way to the beach later today. Patient is on Eliquis  he was brought in as a level 2 trauma.  CT scan of the head neck abdomen and pelvis done and the only acute abnormality was a small subarachnoid hemorrhage.  Neurosurgery was called and because the area is small with no mass effect or surrounding edema they recommend no follow-up routine imaging.   Assessment & Plan    Assessment and Plan:   Small subarachnoid  hemorrhage Neurosurgery consulted recommended to monitor for changes in neurological exam. Currently appears to be at baseline.  He is alert and answering questions appropriately Continue to monitor the patient off Eliquis  Therapy evaluations have been ordered    Large right pleural effusion Ultrasound thoracentesis ordered for further evaluation    Paroxysmal atrial fibrillation Rate controlled, off Eliquis  for the subarachnoid hemorrhage   History of myelofibrosis with iron deficiency anemia Follows up with oncology outpatient    Stage IV CKD Creatinine at 3.48 today    Iron deficiency anemia on Procrit    Leukopenia Continue to monitor, possibly from myelofibrosis   Abnormal thyroid  panel TSH is 30, free t4 is low, patient is on synthroid  75 mcg. Recommend increasing the dose to  100 mcg on discharge.   Left shoulder pain.  From the fall.  [facility] MRI of the left shoulder.      Estimated body mass index is 22.32 kg/m as calculated from the following:   Height as of this encounter: 5\' 11"  (1.803 m).   Weight as of this encounter: 72.6 kg.  Code Status: full code.  DVT Prophylaxis:  scd's   Level of Care: Level of care: Telemetry Medical Family Communication: none at bedside.   Disposition Plan:     Remains inpatient appropriate:  pending clinical improvement.   Procedures:  None.  Consultants:   NS  Antimicrobials:   Anti-infectives (From admission, onward)    Start     Dose/Rate Route Frequency Ordered Stop   09/21/23 1630  ceFEPIme  (MAXIPIME ) 2 g in sodium chloride  0.9 % 100 mL IVPB        2 g 200 mL/hr over 30 Minutes Intravenous  Once 09/21/23 1621 09/21/23 1900   09/21/23 1630  vancomycin  (VANCOCIN ) IVPB 1000 mg/200 mL premix        1,000 mg  200 mL/hr over 60 Minutes Intravenous  Once 09/21/23 1621 09/21/23 1913   09/21/23 1630  metroNIDAZOLE  (FLAGYL ) IVPB 500 mg        500 mg 100 mL/hr over 60 Minutes Intravenous  Once 09/21/23 1621 09/21/23 1906        Medications  Scheduled Meds:  amiodarone   200 mg Oral Daily   amLODipine   10 mg Oral Daily   DULoxetine   20 mg Oral QHS   furosemide   40 mg Oral Daily   lenalidomide   5 mg Oral Q24H   levothyroxine   75 mcg Oral QAC breakfast   metoprolol  succinate  25 mg Oral Daily   pantoprazole   40 mg Oral Daily   QUEtiapine   25 mg Oral QHS   simvastatin   40 mg Oral QHS   tamsulosin   0.8 mg Oral QPM   Continuous Infusions: PRN Meds:.acetaminophen  **OR** acetaminophen , melatonin, ondansetron  **OR** ondansetron  (ZOFRAN ) IV, sorbitol     Subjective:   [name] was seen and examined today.  Reports pain in the left shoulder.   Objective:   Vitals:   09/22/23 0800 09/22/23 0900 09/22/23 1000 09/22/23 1100  BP: 129/76 124/68 (!) 140/55 129/72  Pulse: 84 85 86 87  Resp: 17 17 14 17    Temp:      TempSrc:      SpO2: 99% 98% 99% 100%  Weight:      Height:        Intake/Output Summary (Last 24 hours) at 09/22/2023 1237 Last data filed at 09/22/2023 0503 Gross per 24 hour  Intake 240 ml  Output 600 ml  Net -360 ml   Filed Weights   09/21/23 1420  Weight: 72.6 kg     Exam General exam: Appears calm and comfortable  Respiratory system: Clear to auscultation. Respiratory effort normal. Cardiovascular system: S1 & S2 heard, RRR. No JVD,  Gastrointestinal system: Abdomen is nondistended, soft and nontender.  Central nervous system: Alert and oriented. Extremities: left shoulder tenderness. Skin: No rashes,  Psychiatry: Mood & affect appropriate.    Data Reviewed:  I have personally reviewed following labs and imaging studies   CBC Lab Results  Component Value Date   WBC 3.7 (L) 09/22/2023   RBC 3.28 (L) 09/22/2023   HGB 9.1 (L) 09/22/2023   HCT 30.7 (L) 09/22/2023   MCV 93.6 09/22/2023   MCH 27.7 09/22/2023   PLT 250 09/22/2023   MCHC 29.6 (L) 09/22/2023   RDW 19.5 (H) 09/22/2023   LYMPHSABS 0.7 09/13/2023   MONOABS 0.4 09/13/2023   EOSABS 0.0 09/13/2023   BASOSABS 0.0 09/13/2023     Last metabolic panel Lab Results  Component Value Date   NA 140 09/22/2023   K 4.8 09/22/2023   CL 110 09/22/2023   CO2 21 (L) 09/22/2023   BUN 56 (H) 09/22/2023   CREATININE 3.48 (H) 09/22/2023   GLUCOSE 100 (H) 09/22/2023   GFRNONAA 17 (L) 09/22/2023   GFRAA 55 (L) 05/25/2020   CALCIUM  8.2 (L) 09/22/2023   PHOS 6.6 (H) 05/05/2023   PROT 5.6 (L) 09/22/2023   ALBUMIN  2.9 (L) 09/22/2023   LABGLOB 1.5 09/13/2022   AGRATIO 2.6 (H) 09/13/2022   BILITOT 0.7 09/22/2023   ALKPHOS 103 09/22/2023   AST 17 09/22/2023   ALT 18 09/22/2023   ANIONGAP 9 09/22/2023    CBG (last 3)  No results for input(s): "GLUCAP" in the last 72 hours.    Coagulation Profile: Recent Labs  Lab 09/21/23 1432  INR 1.4*  Radiology Studies: CT CHEST ABDOMEN PELVIS W  CONTRAST Result Date: 09/21/2023 CLINICAL DATA:  Trauma EXAM: CT CHEST, ABDOMEN, AND PELVIS WITH CONTRAST TECHNIQUE: Multidetector CT imaging of the chest, abdomen and pelvis was performed following the standard protocol during bolus administration of intravenous contrast. RADIATION DOSE REDUCTION: This exam was performed according to the departmental dose-optimization program which includes automated exposure control, adjustment of the mA and/or kV according to patient size and/or use of iterative reconstruction technique. CONTRAST:  75mL OMNIPAQUE  IOHEXOL  350 MG/ML SOLN COMPARISON:  CT abdomen and pelvis 05/02/2023. MRI abdomen 02/02/2023. FINDINGS: CT CHEST FINDINGS Cardiovascular: Ascending aortic aneurysm measures 4 cm. The heart is moderately enlarged. There is a trace pericardial effusion. There are atherosclerotic calcifications of the aorta. Mediastinum/Nodes: There is a peripherally calcified right thyroid  nodule measuring 1 cm. There are no enlarged mediastinal or hilar lymph nodes. The visualized esophagus is within normal limits. Lungs/Pleura: There is a large right pleural effusion which has increased from prior. There is compressive atelectasis of the right middle lobe and right lower lobe. There is minimal dependent atelectasis in the left lower lobe. The left lung is otherwise clear. Trachea and central airways are patent. Musculoskeletal: Right shoulder arthroplasty is present. No acute fractures are seen. CT ABDOMEN PELVIS FINDINGS Hepatobiliary: Heterogeneous enhancing lesion in the liver measures 2.9 x 2.4 cm and is not well seen on delayed imaging favored as hemangioma, confirmed by prior MRI. No subcapsular fluid collection. Gallbladder and bile ducts are within normal limits. Pancreas: Unremarkable. No pancreatic ductal dilatation or surrounding inflammatory changes. Spleen: The spleen is mild-to-moderately enlarged, unchanged. Adrenals/Urinary Tract: Bilateral renal cysts are unchanged. The  largest simple cyst in the right kidney measures 7.4 cm. The largest simple cyst in the left kidney measures 6.3 cm. Within the inferior pole the right kidney there is an exophytic cystic area with peripheral calcifications measuring 7.9 x 12.5 cm, similar to prior. There are numerable additional hypodensities which are too small to characterize in both kidneys, favored as cysts. There is no hydronephrosis or perinephric fluid collection. The adrenal glands and bladder are within normal limits. Stomach/Bowel: Stomach is within normal limits. Appendix appears normal. No evidence of bowel wall thickening, distention, or inflammatory changes. There is diffuse colonic diverticulosis. 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 Aorta and IVC are normal in size there are atherosclerotic calcifications of the aorta and coronary arteries. No enlarged lymph nodes are identified. Reproductive: Prostate is unremarkable. Other: No abdominal wall hernia or abnormality. No abdominopelvic ascites. Musculoskeletal: Multilevel degenerative changes affect the spine. No acute fractures are identified. IMPRESSION: 1. No acute posttraumatic sequelae in the chest, abdomen or pelvis. 2. Large right pleural effusion has increased from prior with compressive atelectasis of the right middle lobe and right lower lobe. 3. Stable cardiomegaly. 4. 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 Recommend annual imaging followup by CTA or MRA. This recommendation follows 2010 ACCF/AHA/AATS/ACR/ASA/SCA/SCAI/SIR/STS/SVM Guidelines for the Diagnosis and Management of Patients with Thoracic Aortic Disease. Circulation. 2010; 121: W098-J191. Aortic aneurysm NOS (ICD10-I71.9) 5. Stable splenomegaly and splenic varices. 6. 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 7. Stable bilateral renal cysts. Stable 1 cm peripherally calcified cyst in the inferior pole of the right kidney. 8. Colonic diverticulosis. Aortic Atherosclerosis (ICD10-I70.0).  Electronically Signed   By: Tyron Gallon M.D.   On: 09/21/2023 17:26   DG Pelvis Portable Result Date: 09/21/2023 CLINICAL DATA:  Status post fall on blood thinners with altered mental status EXAM: PORTABLE PELVIS 2 VIEWS COMPARISON:  CT abdomen and pelvis dated 05/02/2023 FINDINGS: There is no evidence of acute displaced pelvic fracture or diastasis. No pelvic bone lesions are seen. IMPRESSION: No acute displaced pelvic fracture or diastasis. Electronically Signed   By: Limin  Xu M.D.   On: 09/21/2023 16:38   DG Chest Port 1 View Result Date: 09/21/2023 CLINICAL DATA:  Trauma EXAM: PORTABLE CHEST 1 VIEW COMPARISON:  Chest x-ray 05/04/2023 FINDINGS: There is a moderate-sized right pleural effusion. The heart is enlarged there central pulmonary vascular congestion. The cardiac silhouette is enlarged, unchanged. Right shoulder arthroplasty is present. No acute fractures are seen. IMPRESSION: 1. Moderate-sized right pleural effusion. 2. Cardiomegaly with central pulmonary vascular congestion. Electronically Signed   By: Tyron Gallon M.D.   On: 09/21/2023 16:38   CT HEAD WO CONTRAST Addendum Date: 09/21/2023 ADDENDUM REPORT: 09/21/2023 16:05 ADDENDUM: These results were called by telephone at the time of interpretation on 09/21/2023 at 4:02 pm to provider TRAVIS YOUNG , who verbally acknowledged these results. Electronically Signed   By: Denny Flack M.D.   On: 09/21/2023 16:05   Result Date: 09/21/2023 CLINICAL DATA:  Blunt trauma, fall. EXAM: CT HEAD WITHOUT CONTRAST TECHNIQUE: Contiguous axial images were obtained from the base of the skull through the vertex without intravenous contrast. RADIATION DOSE REDUCTION: This exam was performed according to the departmental dose-optimization program which includes automated exposure control, adjustment of the mA and/or kV according to patient size and/or use of iterative reconstruction technique. COMPARISON:  CT head 04/17/2023 FINDINGS: Brain: Focal  subarachnoid hemorrhage involving sulci within the left parietal lobe. No additional areas of intracranial hemorrhage noted. No edema or mass effect. No midline shift. No CT evidence of acute infarct. Nonspecific hypoattenuation in the periventricular and subcortical white matter favored to reflect chronic microvascular ischemic changes. The basilar cisterns are patent. Ventricles: Prominence of the ventricles suggesting underlying parenchymal volume loss. Vascular: Atherosclerotic calcifications of the carotid siphons and intracranial vertebral arteries. No hyperdense vessel. Skull: No acute or aggressive finding. Orbits: Bilateral lens replacement. Scleral buckle on the right. Orbits otherwise unremarkable. Sinuses: Mild mucosal thickening in the right maxillary sinus. Other: Mastoid air cells are clear. Left frontal scalp hematoma. Additional soft tissue swelling in the left parietal scalp. Traumatic Brain Injury Risk Stratification Skull Fracture: No - Low/mBIG 1 Subdural Hematoma (SDH): No - Low Subarachnoid Hemorrhage [facility]): trace (up to 2 sulci) - Low/mBIG 1 Epidural Hematoma (EDH): No - Low/mBIG 1 Cerebral contusion, intra-axial, intraparenchymal Hemorrhage (IPH): No Intraventricular Hemorrhage (IVH): No - Low/mBIG 1 Midline Shift > 1mm or Edema/effacement of sulci/vents: No - Low/mBIG 1 ---------------------------------------------------- IMPRESSION: Focal subarachnoid hemorrhage involving a single sulcus of the left parietal lobe. No edema, mass effect, or midline shift. Left frontoparietal scalp hematomas. Chronic microvascular ischemic changes and mild to moderate parenchymal volume loss. Electronically Signed: By: Denny Flack M.D. On: 09/21/2023 15:58   CT CERVICAL SPINE WO CONTRAST Result Date: 09/21/2023 CLINICAL DATA:  Poly trauma, blunt.  Status post fall. EXAM: CT CERVICAL SPINE WITHOUT CONTRAST TECHNIQUE: Multidetector CT imaging of the cervical spine was performed without intravenous  contrast. Multiplanar CT image reconstructions were also generated. RADIATION DOSE REDUCTION: This exam was performed according to the departmental dose-optimization program which includes automated exposure control, adjustment of the mA and/or kV according to patient size and/or use of iterative reconstruction technique. COMPARISON:  Cervical spine radiographs 09/18/2012. MRI cervical spine 01/14/2011. Head CT and neck CTA 04/12/2022. FINDINGS: Alignment: Straightening of the usual cervical lordosis with a mild convex right scoliosis.  4 mm of anterolisthesis at C5-6, increased from previous cervical MRI. Skull base and vertebrae: There is diffuse spondylosis with severe disc space narrowing and facet arthrosis throughout the cervical spine. There is interfacetal ankylosis bilaterally at C2-3 and on the left at C4-5. Possible interbody ankylosis at those levels as well. Asymmetric arthrosis involving the right lateral atlantoaxial joint. Suggested nondisplaced fracture of the posterior arch of C1 on the right (best seen on axial images 24 and 25 of series 4) is grossly unchanged from previous head CT (image 1/5). No other evidence of acute cervical spine fracture or traumatic subluxation. Soft tissues and spinal canal: No prevertebral fluid or swelling. No visible canal hematoma. Disc levels: As alluded to above, there is severe multilevel spondylosis with disc space narrowing, uncinate spurring and facet arthrosis. Findings have progressed from previous MRI and contribute to spinal stenosis and foraminal narrowing at multiple levels. Upper chest: Chest findings dictated separately. Other: Bilateral carotid atherosclerosis. IMPRESSION: 1. No evidence of acute cervical spine fracture or traumatic subluxation. 2. 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 3. No acute soft tissue abnormalities identified. Electronically Signed   By: Elmon Hagedorn M.D.   On: 09/21/2023 15:25        Feliciana Horn M.D. [facility] 09/22/2023, 12:37 PM  Available via Epic secure chat 7am-7pm After 7 pm, please refer to night coverage provider listed on amion.

## 2023-09-22 NOTE — Progress Notes (Signed)
 Transition of Care [facility]) - CAGE-AID Screening   Patient Details  Name: [name] MRN: [id number] Date of Birth: February 13, 1941  Transition of Care [facility]) CM/SW Contact:    Brunetta Capes, RN Phone Number: 09/22/2023, 9:21 PM   Clinical Narrative:  Patient denies the use of alcohol and illicit drugs. Resources not given at this time.   CAGE-AID Screening:    Have You Ever Felt You Ought to Cut Down on Your Drinking or Drug Use?: No Have People Annoyed You By Critizing Your Drinking Or Drug Use?: No Have You Felt Bad Or Guilty About Your Drinking Or Drug Use?: No Have You Ever Had a Drink or Used Drugs First Thing In The Morning to Steady Your Nerves or to Get Rid of a Hangover?: No CAGE-AID Score: 0  Substance Abuse Education Offered: No

## 2023-09-22 NOTE — Evaluation (Signed)
 Physical Therapy Evaluation Patient Details Name: BRENNER VISCONTI MRN: [id number] DOB: 07/07/1940 Today's Date: 09/22/2023  History of Present Illness  83 yo M who presented to [facility] ED 09/21/23 after falling at home. CT shows small acute subarachnoid hemorrhage, no other acute injuries or fractures. PMH Covid, HLD, HTN, hypothyroidism, myelofibrosis, skin CA, CVA, back surgery  Clinical Impression   RN present and assisted in session today.  We did attempt to get to EOB but he needed heavy assist and had strong R lean, reports severe dizziness with positional changes. Able to roll with MinA. Unfortunately imaging tech arrived to take him to MRI while PT was working with him, which prevented me from assessing dizziness and mobility more in depth. Regardless, given mobility this afternoon he would likely benefit from post-acute rehab <3 hours in duration to ensure safe mobility and reduce fall risk prior to return home.       If plan is discharge home, recommend the following: Two people to help with walking and/or transfers;A lot of help with bathing/dressing/bathroom;Assistance with cooking/housework;Direct supervision/assist for medications management;Assist for transportation;Supervision due to cognitive status;A lot of help with walking and/or transfers;Two people to help with bathing/dressing/bathroom;Help with stairs or ramp for entrance   Can travel by private vehicle   No    Equipment Recommendations None recommended by PT (defer to next venue)  Recommendations for Other Services       Functional Status Assessment Patient has had a recent decline in their functional status and demonstrates the ability to make significant improvements in function in a reasonable and predictable amount of time.     Precautions / Restrictions Precautions Precautions: Fall Precaution/Restrictions Comments: caution with L shoulder, MRI pending Restrictions Weight Bearing Restrictions Per Provider Order:  No      Mobility  Bed Mobility Overal bed mobility: Needs Assistance Bed Mobility: Rolling, Supine to Sit, Sit to Supine Rolling: Min assist   Supine to sit: Mod assist, +2 for physical assistance Sit to supine: Mod assist, +2 for physical assistance   General bed mobility comments: MinA for rolling to his R/reaching with L shoulder, then ModAx2 to get to EOB- very dizzy and pushing hard to the right so we returned to supine    Transfers                   General transfer comment: deferred, imaging had arrived for MRI    Ambulation/Gait               General Gait Details: deferred, will need +2  Stairs            Wheelchair Mobility     Tilt Bed    Modified Rankin (Stroke Patients Only)       Balance Overall balance assessment: History of Falls, Needs assistance Sitting-balance support: Single extremity supported, Feet unsupported Sitting balance-Leahy Scale: Zero                                       Pertinent Vitals/Pain Pain Assessment Pain Assessment: Faces Faces Pain Scale: Hurts even more Pain Location: neck, L shoulder Pain Descriptors / Indicators: Aching, Discomfort, Grimacing, Guarding Pain Intervention(s): Monitored during session, Limited activity within patient's tolerance    Home Living Family/patient expects to be discharged to:: Private residence Living Arrangements: Spouse/significant other Available Help at Discharge: Family;Available 24 hours/day;Available PRN/intermittently Type of Home: House Home Access: Stairs to  enter Entrance Stairs-Rails: Right;Left Entrance Stairs-Number of Steps: 4   Home Layout: Multi-level;Able to live on main level with bedroom/bathroom Home Equipment: Other (comment) Additional Comments: *wide base QC    Prior Function Prior Level of Function : Independent/Modified Independent;Driving;History of Falls (last six months)             Mobility Comments: all info  taken from prior charting, pt confused and spouse not present ADLs Comments: Indep with ADLs but limited with RUE due to pain prior to surgery     Extremity/Trunk Assessment   Upper Extremity Assessment Upper Extremity Assessment: Defer to OT evaluation    Lower Extremity Assessment Lower Extremity Assessment: Generalized weakness    Cervical / Trunk Assessment Cervical / Trunk Assessment: Kyphotic  Communication   Communication Communication: Impaired Factors Affecting Communication: Hearing impaired    Cognition Arousal: Alert Behavior During Therapy: WFL for tasks assessed/performed, Anxious   PT - Cognitive impairments: Orientation, Awareness, Attention, Safety/Judgement, Problem solving                         Following commands: Intact       Cueing Cueing Techniques: Verbal cues, Gestural cues, Tactile cues, Visual cues     General Comments General comments (skin integrity, edema, etc.): eval limited by imaging tech arriving to take him to his MRI    Exercises     Assessment/Plan    PT Assessment Patient needs continued PT services  PT Problem List Decreased strength;Decreased mobility;Decreased safety awareness;Decreased coordination;Decreased activity tolerance;Decreased balance       PT Treatment Interventions DME instruction;Therapeutic exercise;Gait training;Balance training;Stair training;Functional mobility training;Therapeutic activities;Patient/family education    PT Goals (Current goals can be found in the Care Plan section)  Acute Rehab PT Goals Patient Stated Goal: not stated PT Goal Formulation: With patient Time For Goal Achievement: 10/06/23 Potential to Achieve Goals: Fair    Frequency Min 1X/week     Co-evaluation               AM-PAC PT 6 Clicks Mobility  Outcome Measure Help needed turning from your back to your side while in a flat bed without using bedrails?: A Little Help needed moving from lying on your  back to sitting on the side of a flat bed without using bedrails?: Total Help needed moving to and from a bed to a chair (including a wheelchair)?: Total Help needed standing up from a chair using your arms (e.g., wheelchair or bedside chair)?: Total Help needed to walk in hospital room?: Total Help needed climbing 3-5 steps with a railing? : Total 6 Click Score: 8    End of Session   Activity Tolerance: Treatment limited secondary to medical complications (Comment) (dizzy) Patient left: in bed;with call bell/phone within reach (RN present and attending) Nurse Communication: Mobility status PT Visit Diagnosis: Unsteadiness on feet (R26.81);Muscle weakness (generalized) (M62.81);History of falling (Z91.81);Difficulty in walking, not elsewhere classified (R26.2)    Time: 8743-8695 PT Time Calculation (min) (ACUTE ONLY): 8 min   Charges:   PT Evaluation $PT Eval Moderate Complexity: 1 Mod   PT General Charges $$ ACUTE PT VISIT: 1 Visit       Josette Rough, PT, DPT 09/22/23 3:02 PM

## 2023-09-22 NOTE — Evaluation (Signed)
 Speech Language Pathology Evaluation Patient Details Name: [name] MRN: [id number] DOB: [birth year] Today's Date: 09/22/2023 Time: 8777-8767 SLP Time Calculation (min) (ACUTE ONLY): 10 min  Problem List:  Patient Active Problem List   Diagnosis Date Noted   Subarachnoid hemorrhage following injury (HCC) 09/21/2023   CKD (chronic kidney disease) stage 4, GFR 15-29 ml/min (HCC) 06/29/2023   Pleural effusion 05/30/2023   Atrial flutter (HCC) 05/23/2023   History of CVA (cerebrovascular accident) 05/23/2023   Hypernatremia 05/23/2023   Pericardial effusion 05/23/2023   Pericarditis 05/23/2023   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   Overweight (BMI 25.0-29.9) 05/05/2023   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, 41-49%) (HCC) 05/05/2023   Acute kidney injury superimposed on chronic kidney disease (HCC) 05/04/2023   Atrial fibrillation with rapid ventricular response (HCC) 05/04/2023   Normocytic anemia 05/03/2023   ST elevation myocardial infarction (STEMI) (HCC) 05/03/2023   Tachycardia 05/03/2023   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/2023   Acute pericarditis 05/02/2023   Altered mental status 04/21/2023   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, of the knee, left 04/21/2023   Knee effusion, left 04/21/2023   Acute renal failure with acute 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) 04/17/2023   History of melanoma 04/17/2023   S/P reverse total shoulder arthroplasty, 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 04/17/2023   Hallucinations 04/17/2023   Hyperkalemia 04/17/2023   Acute pain of left knee 04/17/2023   Delirium 04/17/2023   Acute kidney injury (HCC) 04/17/2023   Rotator cuff arthropathy, right 02/01/2023   Rotator cuff tendinitis, right 02/01/2023   Traumatic complete tear of right rotator cuff 02/01/2023   Proteinuria, unspecified 10/19/2022   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   CKD stage 3b,  GFR 30-44 ml/min (HCC) 09/13/2022   Cervical spondylosis 10/07/2021   Hypothyroidism 07/23/2021   Low vitamin B12 level 08/09/2020   Irritability 10/18/2019   Gastroesophageal reflux disease 03/13/2019   Acute on chronic anemia 07/04/2018   Leucopenia 02/05/2018   Supraspinatus tendon tear, right, initial encounter 02/16/2017   Infraspinatus tendon tear, right, initial encounter 02/16/2017   Biceps tendon rupture, right, initial encounter 02/16/2017   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   Hypotension 08/15/2016   Mixed hyperlipidemia 08/15/2016   Dizziness 12/30/2015   BPH (benign prostatic hyperplasia) 03/03/2015   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   Primary osteoarthritis of left knee 03/03/2015   H/O neoplasm 01/12/2015   History of nonmelanoma skin cancer 01/12/2015   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   Anemia 07/17/2014   Hepatosplenomegaly 07/17/2014   Neutropenia (HCC) 07/17/2014   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   Past Medical History:  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   right forearm, [facility]   Stroke (HCC) 11/2007   R brain subcortical infarct  Past Surgical History:  Past Surgical History:  Procedure Laterality Date   ASPIRATION / INJECTION RENAL CYST  07/08/2015   BACK SURGERY     approx 20- 25 years ago    COLONOSCOPY     EYE SURGERY     cataract both eyes   GAS INSERTION  08/11/2011   Procedure: INSERTION OF GAS;  Surgeon: Norleen JONETTA Ku, MD;  Location: [facility] OR;  Service: Ophthalmology;  Laterality: Right;  C3F8   IR BONE MARROW BIOPSY & ASPIRATION  08/03/2023   LEFT HEART CATH AND CORONARY ANGIOGRAPHY N/A 05/02/2023   Procedure: LEFT HEART CATH AND CORONARY ANGIOGRAPHY;  Surgeon: Mady Bruckner, MD;  Location: [facility] INVASIVE CV LAB;  Service: Cardiovascular;  Laterality: N/A;   REVERSE SHOULDER ARTHROPLASTY Right 04/13/2023   Procedure: REVERSE SHOULDER ARTHROPLASTY;  Surgeon: Edie Norleen PARAS, MD;  Location: [facility] ORS;  Service: Orthopedics;  Laterality: Right;   SCLERAL BUCKLE  08/11/2011   Procedure: SCLERAL BUCKLE;  Surgeon: Norleen JONETTA Ku, MD;  Location: [facility] OR;  Service: Ophthalmology;  Laterality: Right;   VARICOSE VEIN SURGERY     HPI:  [name] is an 83 yo male presenting to ED 4/17 after falling in his yard. CTH shows small L parietal SAH. CT Chest/Abdomen/Pelvis revealed a large R pleural effusion with increased compressive atelectasis of the R middle and R lower lobe. PMH includes GERD, HLD, HTN, melanoma, SCC, prior CVA (R subcortical infarct) 2009   Assessment / Plan / Recommendation Clinical Impression  Pt reports concern with acute change in memory, although states his primary concern is his L shoulder given history of shoulder injury. He is oriented x4 and followed all single step commands. Pt's hearing aids are not currently available which may be additionally affecting his performance. He accurately recalled 3/4 novel words after a delay. Increased difficulty was noted on simple calculations and problem solving tasks, although this may have been impacted by his attention given frequent environmental distractions. Pt states his performance appears different from his baseline. SLP will continue following for further assessment.    SLP Assessment  SLP Recommendation/Assessment:  Patient needs continued Speech Lanaguage Pathology Services SLP Visit Diagnosis: Cognitive communication deficit (R41.841)    Recommendations for follow up therapy are one component of a multi-disciplinary discharge planning process, led by the attending physician.  Recommendations may be updated based on patient status, additional functional criteria and insurance authorization.    Follow Up Recommendations  Follow physician's recommendations for discharge plan and follow up therapies    Assistance Recommended at Discharge  Intermittent Supervision/Assistance  Functional Status Assessment Patient has had a recent decline in their functional status and demonstrates the ability to make significant improvements in function in a reasonable and predictable amount of time.  Frequency and Duration min 2x/week  2 weeks      SLP Evaluation Cognition  Overall Cognitive Status: Impaired/Different from baseline Arousal/Alertness: Awake/alert Orientation Level: Oriented X4 Attention: Sustained Sustained Attention: Impaired Sustained Attention Impairment: Verbal basic Memory: Impaired Memory Impairment: Retrieval deficit Awareness: Appears intact Problem Solving: Impaired Problem Solving Impairment: Verbal basic       Comprehension  Auditory Comprehension Overall Auditory Comprehension: Appears within functional limits for tasks assessed    Expression Expression Primary Mode of Expression: Verbal Verbal Expression Overall Verbal Expression: Appears within functional limits for tasks assessed   Oral / Motor  Oral Motor/Sensory Function Overall Oral Motor/Sensory Function: Within functional limits Motor Speech Overall Motor Speech: Appears within functional limits for tasks assessed  Damien Blumenthal, M.A., CCC-SLP Speech Language Pathology, Acute Rehabilitation Services  Secure Chat preferred [phone]  09/22/2023, 12:58 PM

## 2023-09-22 NOTE — Procedures (Signed)
 PROCEDURE SUMMARY:  Successful image-guided right thoracentesis. Yielded 1,650 mL of hazy amber fluid. Pt tolerated procedure well. No immediate complications. EBL = trace   Specimen was sent for labs. CXR ordered.  Please see imaging section of Epic for full dictation.  Kayren Holck H Gage Weant PA-C 09/22/2023 3:24 PM

## 2023-09-22 NOTE — Evaluation (Signed)
 Clinical/Bedside Swallow Evaluation Patient Details  Name: TOMER CHALMERS MRN: [id number] Date of Birth: 12-Jan-1941  Today's Date: 09/22/2023 Time: SLP Start Time (ACUTE ONLY): 1212 SLP Stop Time (ACUTE ONLY): 1222 SLP Time Calculation (min) (ACUTE ONLY): 10 min  Past Medical History:  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   right forearm, [facility]   Stroke (HCC) 11/2007   R brain subcortical infarct   Past Surgical History:  Past Surgical History:  Procedure Laterality Date   ASPIRATION / INJECTION RENAL CYST  07/08/2015   BACK SURGERY     approx 20- 25 years ago   COLONOSCOPY     EYE SURGERY     cataract both eyes   GAS INSERTION  08/11/2011   Procedure: INSERTION OF GAS;  Surgeon: Rexene Catching, MD;  Location: [facility] OR;  Service: Ophthalmology;  Laterality: Right;  C3F8   IR BONE MARROW BIOPSY & ASPIRATION  08/03/2023   LEFT HEART CATH AND CORONARY ANGIOGRAPHY N/A 05/02/2023   Procedure: LEFT HEART CATH AND CORONARY ANGIOGRAPHY;  Surgeon: Sammy Crisp, MD;  Location: [facility] INVASIVE CV LAB;  Service: Cardiovascular;  Laterality: N/A;   REVERSE SHOULDER ARTHROPLASTY Right 04/13/2023   Procedure: REVERSE SHOULDER ARTHROPLASTY;  Surgeon: Elner Hahn, MD;  Location: [facility] ORS;  Service: Orthopedics;  Laterality: Right;   SCLERAL BUCKLE  08/11/2011   Procedure: SCLERAL BUCKLE;  Surgeon: Rexene Catching, MD;  Location: [facility] OR;  Service: Ophthalmology;  Laterality: Right;    VARICOSE VEIN SURGERY     HPI:  [name] is an 83 yo male presenting to ED 4/17 after falling in his yard. CTH shows small L parietal SAH. CT Chest/Abdomen/Pelvis revealed a large R pleural effusion with increased compressive atelectasis of the R middle and R lower lobe. PMH includes GERD, HLD, HTN, melanoma, SCC, prior CVA (R subcortical infarct) 2009    Assessment / Plan / Recommendation  Clinical Impression  Pt denies a history of dysphagia and presents with a WFL oropharyngeal swallow. He passed a Yale swallow screen and no s/s of dysphagia or aspiration were observed with trials today. Recommend continuing current diet without further SLP f/u. Signing off. SLP Visit Diagnosis: Dysphagia, unspecified (R13.10)    Aspiration Risk  Mild aspiration risk    Diet Recommendation Regular;Thin liquid    Liquid Administration via: Cup;Straw Medication Administration: Whole meds with liquid Supervision: Patient able to self feed Compensations: Minimize environmental distractions;Slow rate;Small sips/bites Postural Changes: Seated upright at 90 degrees;Remain upright for at least 30 minutes after po intake    Other  Recommendations Oral Care Recommendations: Oral care BID    Recommendations for follow up therapy are one component of a multi-disciplinary discharge planning process, led by the attending physician.  Recommendations may be updated based on patient status, additional functional criteria and insurance authorization.  Follow up Recommendations No SLP follow up  Assistance Recommended at Discharge    Functional Status Assessment Patient has not had a recent decline in their functional status  Frequency and Duration            Prognosis Prognosis for improved oropharyngeal function: Good Barriers to Reach Goals: Cognitive deficits      Swallow Study   General HPI: [name] is an 83 yo male presenting to ED 4/17 after falling in his yard. CTH shows small L  parietal SAH. CT Chest/Abdomen/Pelvis revealed a large R pleural effusion with increased compressive atelectasis of the R middle and R lower lobe. PMH includes GERD, HLD, HTN, melanoma, SCC, prior CVA (R subcortical infarct) 2009 Type of Study: Bedside Swallow Evaluation Previous Swallow Assessment: none in chart Diet Prior to this Study: Regular;Thin liquids (Level 0) Temperature Spikes Noted: No Respiratory Status: Room air History of Recent Intubation: No Behavior/Cognition: Alert;Cooperative;Pleasant mood Oral Cavity Assessment: Within Functional Limits Oral Care Completed by SLP: No Oral Cavity - Dentition: Adequate natural dentition Vision: Functional for self-feeding Self-Feeding Abilities: Able to feed self Patient Positioning: Upright in bed Baseline Vocal Quality: Normal Volitional Cough: Strong Volitional Swallow: Able to elicit    Oral/Motor/Sensory Function Overall Oral Motor/Sensory Function: Within functional limits   Ice Chips Ice chips: Not tested   Thin Liquid Thin Liquid: Within functional limits Presentation: Straw    Nectar Thick Nectar Thick Liquid: Not tested   Honey Thick Honey Thick Liquid: Not tested   Puree Puree: Within functional limits Presentation: Spoon   Solid     Solid: Within functional limits Presentation: Self Fed      Amil Kale, M.A., CCC-SLP Speech Language Pathology, Acute Rehabilitation Services  Secure Chat preferred [phone]  09/22/2023,12:46 PM

## 2023-09-22 NOTE — Progress Notes (Signed)
 PT eval complete, formal note pending. Needed heavy +2 assist for even bed mobility, very dizzy at EOB possibly related to orthostatic hypotension versus BPPV given head strike during this fall. Session limited as imaging had arrived to take him to MRI. From mobility today, would recommend SNF moving forward.  Josette Rough, PT, DPT 09/22/23 1:07 PM

## 2023-09-23 DIAGNOSIS — J90 Pleural effusion, not elsewhere classified: Secondary | ICD-10-CM | POA: Diagnosis not present

## 2023-09-23 DIAGNOSIS — S066X9D Traumatic subarachnoid hemorrhage with loss of consciousness of unspecified duration, subsequent encounter: Secondary | ICD-10-CM | POA: Diagnosis not present

## 2023-09-23 DIAGNOSIS — N401 Enlarged prostate with lower urinary tract symptoms: Secondary | ICD-10-CM | POA: Diagnosis not present

## 2023-09-23 DIAGNOSIS — N281 Cyst of kidney, acquired: Secondary | ICD-10-CM | POA: Diagnosis not present

## 2023-09-23 LAB — BASIC METABOLIC PANEL WITH GFR
Anion gap: 12 (ref 5–15)
BUN: 54 mg/dL — ABNORMAL HIGH (ref 8–23)
CO2: 18 mmol/L — ABNORMAL LOW (ref 22–32)
Calcium: 8 mg/dL — ABNORMAL LOW (ref 8.9–10.3)
Chloride: 105 mmol/L (ref 98–111)
Creatinine, Ser: 3.79 mg/dL — ABNORMAL HIGH (ref 0.61–1.24)
GFR, Estimated: 15 mL/min — ABNORMAL LOW (ref >60)
Glucose, Bld: 121 mg/dL — ABNORMAL HIGH (ref 70–99)
Potassium: 4.4 mmol/L (ref 3.5–5.1)
Sodium: 135 mmol/L (ref 135–145)

## 2023-09-23 LAB — BLOOD CULTURE ID PANEL (REFLEXED) - BCID2

## 2023-09-23 LAB — CBC WITH DIFFERENTIAL/PLATELET
Abs Immature Granulocytes: 0.19 10*3/uL — ABNORMAL HIGH (ref 0.00–0.07)
Basophils Absolute: 0 10*3/uL (ref 0.0–0.1)
Basophils Relative: 1 %
Eosinophils Absolute: 0 10*3/uL (ref 0.0–0.5)
Eosinophils Relative: 1 %
HCT: 29 % — ABNORMAL LOW (ref 39.0–52.0)
Hemoglobin: 9.1 g/dL — ABNORMAL LOW (ref 13.0–17.0)
Immature Granulocytes: 5 %
Lymphocytes Relative: 13 %
Lymphs Abs: 0.5 10*3/uL — ABNORMAL LOW (ref 0.7–4.0)
MCH: 28.5 pg (ref 26.0–34.0)
MCHC: 31.4 g/dL (ref 30.0–36.0)
MCV: 90.9 fL (ref 80.0–100.0)
Monocytes Absolute: 0.7 10*3/uL (ref 0.1–1.0)
Monocytes Relative: 19 %
Neutro Abs: 2.4 10*3/uL (ref 1.7–7.7)
Neutrophils Relative %: 61 %
Platelets: 222 10*3/uL (ref 150–400)
RBC: 3.19 MIL/uL — ABNORMAL LOW (ref 4.22–5.81)
RDW: 19.3 % — ABNORMAL HIGH (ref 11.5–15.5)
WBC: 3.8 10*3/uL — ABNORMAL LOW (ref 4.0–10.5)
nRBC: 0 % (ref 0.0–0.2)

## 2023-09-23 LAB — GLUCOSE, CAPILLARY
Glucose-Capillary: 116 mg/dL — ABNORMAL HIGH (ref 70–99)
Glucose-Capillary: 119 mg/dL — ABNORMAL HIGH (ref 70–99)

## 2023-09-23 LAB — T3, FREE: T3, Free: 1.9 pg/mL — ABNORMAL LOW (ref 2.0–4.4)

## 2023-09-23 MED ORDER — MIDODRINE HCL 5 MG PO TABS
5.0000 mg | ORAL_TABLET | Freq: Three times a day (TID) | ORAL | Status: DC
  Administered 2023-09-23 – 2023-09-29 (×17): 5 mg via ORAL
  Filled 2023-09-23 (×18): qty 1

## 2023-09-23 MED ORDER — LEVOTHYROXINE SODIUM 100 MCG PO TABS
100.0000 ug | ORAL_TABLET | Freq: Every day | ORAL | Status: DC
  Administered 2023-09-24 – 2023-09-29 (×6): 100 ug via ORAL
  Filled 2023-09-23 (×6): qty 1

## 2023-09-23 MED ORDER — METOPROLOL SUCCINATE ER 25 MG PO TB24
25.0000 mg | ORAL_TABLET | Freq: Every day | ORAL | Status: DC
  Administered 2023-09-24 – 2023-09-25 (×2): 25 mg via ORAL
  Filled 2023-09-23 (×2): qty 1

## 2023-09-23 MED ORDER — LACTATED RINGERS IV BOLUS
1000.0000 mL | Freq: Once | INTRAVENOUS | Status: AC
  Administered 2023-09-23: 1000 mL via INTRAVENOUS

## 2023-09-23 MED ORDER — LENALIDOMIDE 5 MG PO CAPS
5.0000 mg | ORAL_CAPSULE | ORAL | Status: DC
  Administered 2023-09-23 – 2023-09-24 (×2): 5 mg via ORAL
  Filled 2023-09-23 (×3): qty 1

## 2023-09-23 MED ORDER — LENALIDOMIDE 5 MG PO CAPS: 5.0000 mg | ORAL_CAPSULE | ORAL | Status: DC

## 2023-09-23 MED ORDER — SODIUM CHLORIDE 0.9 % IV SOLN: INTRAVENOUS | Status: DC

## 2023-09-23 NOTE — Plan of Care (Signed)
   Problem: Education: Goal: Knowledge of General Education information will improve Description Including pain rating scale, medication(s)/side effects and non-pharmacologic comfort measures Outcome: Progressing   Problem: Health Behavior/Discharge Planning: Goal: Ability to manage health-related needs will improve Outcome: Progressing

## 2023-09-23 NOTE — Progress Notes (Addendum)
 Prior-To-Admission Oral Chemotherapy for Treatment of Oncologic Disease   Order noted from Dr. Chiquita Councilman to continue prior-to-admission oral chemotherapy regimen of Lenalidomide .  Procedure Per Pharmacy & Therapeutics Committee Policy: Orders for continuation of home oral chemotherapy for treatment of an oncologic disease will be held unless approved by an oncologist during current admission.    For patients receiving oncology care at [facility], inpatient pharmacist contacts patient's oncologist during regular office hours to review. If earlier review is medically necessary, attending physician consults [facility] on-call oncologist   Oral chemotherapy continuation order is on hold pending oncologist review, On call oncologist, Dr. Adrian Alba has been contacted. Okay to resume while inpatient.      Islam Villescas 09/23/2023, 9:17 AM

## 2023-09-23 NOTE — Progress Notes (Signed)
 [facility]                                                                               [name], is a 83 y.o. male, DOB - 1940-11-30, WUJ:[id number] Admit date - 09/21/2023    Outpatient Primary MD for the patient is [name], DO  LOS - 2  days    Brief summary    [name] is a 83 y.o. male with medical history significant for atrial fibrillation on Eliquis . 83 year old reports that he was in his yard.  He remembers walking up a ramp and the next and he knows he is in the hospital.   The patient was found on the ground in his yard.   He says he was feeling fine prior to the fall.  His wife says they are actually packed and were on their way to the beach later today. Patient is on Eliquis  he was brought in as a level 2 trauma.  CT scan of the head neck abdomen and pelvis done and the only acute abnormality was a small subarachnoid hemorrhage.  Neurosurgery was called and because the area is small with no mass effect or surrounding edema they recommend no follow-up routine imaging.  Patient was also complaining of left shoulder pain. MRI of the left shoulder ordered and pending.   Assessment & Plan    Assessment and Plan:   Small subarachnoid  hemorrhage Neurosurgery consulted recommended to monitor for changes in neurological exam. Currently appears to be at baseline.  He is alert and answering questions appropriately Continue to monitor the patient off Eliquis  Therapy evaluations have been ordered and pending.     Large right pleural effusion Ultrasound thoracentesis ordered and 1.6 lit fluid removed.  Fluid sent for analysis.     Paroxysmal atrial fibrillation Rate controlled, off Eliquis  for the subarachnoid hemorrhage   History of myelofibrosis with iron deficiency anemia Follows up with oncology outpatient    Stage IV CKD Creatinine at 3.48 today    Iron deficiency anemia   Transfuse to keep it greater than 7.     Leukopenia Continue to monitor, possibly from myelofibrosis   Abnormal thyroid  panel TSH is 30, free t4 is low, patient is on synthroid  75 mcg. Recommend increasing the dose to 100 mcg.  Left shoulder pain.  From the fall.  MRI of the left shoulder shows Partial-thickness tear at the origin of the acromial head of the deltoid muscle with associated grade 2 strain.  Widening of the acromioclavicular joint measuring 8-10 mm with disruption of the superior acromioclavicular ligament and associated joint effusion. These findings are compatible with [place] type II injury.  Orthopedics consulted for recommendations.  He will probably need a sling.    Hypotension:  Hold all BP medications.  Hold off on the metoprolol  today.  1 lit RL bolus given with improvement int he BP.  Continue with NS for maintenance hydration.  Start the patient on midodrine  and recheck BP in one hours.       Estimated body mass index is 22.32 kg/m as calculated from the following:   Height as of this encounter: 5\' 11"  (1.803 m).  Weight as of this encounter: 72.6 kg.  Code Status: full code.  DVT Prophylaxis:  Place and maintain sequential compression device Start: 09/22/23 1724scd's   Level of Care: Level of care: Telemetry Medical Family Communication: none at bedside.   Disposition Plan:     Remains inpatient appropriate:  pending clinical improvement.   Procedures:  None.  Consultants:   NS Orthopedics Palliative care  Antimicrobials:   Anti-infectives (From admission, onward)    Start     Dose/Rate Route Frequency Ordered Stop   09/21/23 1630  ceFEPIme  (MAXIPIME ) 2 g in sodium chloride  0.9 % 100 mL IVPB        2 g 200 mL/hr over 30 Minutes Intravenous  Once 09/21/23 1621 09/21/23 1900   09/21/23 1630  vancomycin  (VANCOCIN ) IVPB 1000 mg/200 mL premix        1,000 mg 200 mL/hr over 60 Minutes Intravenous  Once 09/21/23 1621 09/21/23 1913   09/21/23 1630  metroNIDAZOLE  (FLAGYL )  IVPB 500 mg        500 mg 100 mL/hr over 60 Minutes Intravenous  Once 09/21/23 1621 09/21/23 1906        Medications  Scheduled Meds:  amiodarone   200 mg Oral Daily   DULoxetine   20 mg Oral QHS   lenalidomide   5 mg Oral Q24H   [START ON 09/24/2023] levothyroxine   100 mcg Oral QAC breakfast   [START ON 09/24/2023] metoprolol  succinate  25 mg Oral Daily   midodrine   5 mg Oral TID WC   pantoprazole   40 mg Oral Daily   QUEtiapine   25 mg Oral QHS   simvastatin   40 mg Oral QHS   Continuous Infusions:  sodium chloride      PRN Meds:.acetaminophen  **OR** acetaminophen , melatonin, ondansetron  **OR** ondansetron  (ZOFRAN ) IV, sorbitol     Subjective:   [name] was seen and examined today.  Dizziness on standing.  Objective:   Vitals:   09/22/23 2038 09/22/23 2356 09/23/23 0749 09/23/23 1258  BP: 114/66 112/61 96/61 (!) 85/62  Pulse: 91 91 86 84  Resp: 18 18 16 17   Temp: 99.2 F (37.3 C) 99.4 F (37.4 C) 98.6 F (37 C) 97.8 F (36.6 C)  TempSrc: Oral Oral Oral Oral  SpO2: 97% 95% 96% 97%  Weight:      Height:        Intake/Output Summary (Last 24 hours) at 09/23/2023 1558 Last data filed at 09/23/2023 1117 Gross per 24 hour  Intake 680 ml  Output 1000 ml  Net -320 ml   Filed Weights   09/21/23 1420  Weight: 72.6 kg     Exam General exam: Appears calm and comfortable  Respiratory system: Clear to auscultation. Respiratory effort normal. Cardiovascular system: S1 & S2 heard, RRR. No JVD, Gastrointestinal system: Abdomen is nondistended, soft and nontender.  Central nervous system: Alert and oriented. No focal neurological deficits. Extremities: left shoulder tenderness, unable to abduct the arm. Painful movements of the left upper extremity.  Skin: No rashes,  Psychiatry: Mood & affect appropriate.     Data Reviewed:  I have personally reviewed following labs and imaging studies   CBC Lab Results  Component Value Date   WBC 3.8 (L) 09/23/2023   RBC  3.19 (L) 09/23/2023   HGB 9.1 (L) 09/23/2023   HCT 29.0 (L) 09/23/2023   MCV 90.9 09/23/2023   MCH 28.5 09/23/2023   PLT 222 09/23/2023   MCHC 31.4 09/23/2023   RDW 19.3 (H) 09/23/2023   LYMPHSABS 0.5 (L) 09/23/2023  MONOABS 0.7 09/23/2023   EOSABS 0.0 09/23/2023   BASOSABS 0.0 09/23/2023     Last metabolic panel Lab Results  Component Value Date   NA 135 09/23/2023   K 4.4 09/23/2023   CL 105 09/23/2023   CO2 18 (L) 09/23/2023   BUN 54 (H) 09/23/2023   CREATININE 3.79 (H) 09/23/2023   GLUCOSE 121 (H) 09/23/2023   GFRNONAA 15 (L) 09/23/2023   GFRAA 55 (L) 05/25/2020   CALCIUM  8.0 (L) 09/23/2023   PHOS 6.6 (H) 05/05/2023   PROT 5.6 (L) 09/22/2023   ALBUMIN  2.9 (L) 09/22/2023   LABGLOB 1.5 09/13/2022   AGRATIO 2.6 (H) 09/13/2022   BILITOT 0.7 09/22/2023   ALKPHOS 103 09/22/2023   AST 17 09/22/2023   ALT 18 09/22/2023   ANIONGAP 12 09/23/2023    CBG (last 3)  Recent Labs    09/22/23 2356 09/23/23 0512 09/23/23 0754  GLUCAP 103* 116* 119*      Coagulation Profile: Recent Labs  Lab 09/21/23 1432  INR 1.4*     Radiology Studies: MR SHOULDER LEFT WO CONTRAST Result Date: 09/22/2023 CLINICAL DATA:  Fall. EXAM: MRI OF THE LEFT SHOULDER WITHOUT CONTRAST TECHNIQUE: Multiplanar, multisequence MR imaging of the shoulder was performed. No intravenous contrast was administered. COMPARISON:  None Available. FINDINGS: Rotator cuff: Supraspinatus and infraspinatus tendinosis with partial-thickness articular sided insertional tearing at the junctional fibers of the posterior supraspinatus and anterior infraspinatus tendons. Teres minor tendon is intact. Subscapularis tendon is intact. Muscles: Partial-thickness tear at the origin of the acromial head of the deltoid muscle with associated intramuscular edema. No intramuscular collection. No significant muscle atrophy. Biceps Long Head: Diminutive appearance with chronic tearing of the intra-articular biceps tendon.  Acromioclavicular Joint: Moderate arthropathy of the acromioclavicular joint. Widening of the acromioclavicular joint measuring 8-10 mm with associated joint effusion and disruption of the superior acromioclavicular ligament. No subacromial/subdeltoid bursal fluid. Glenohumeral Joint: Glenohumeral joint effusion. Diffuse thinning of the glenoid articular cartilage. Labrum: Diffuse labral degeneration. Tear of the superior labrum extending through the posteroinferior labrum. Bones: No acute fracture identified. Subcortical cystic change at the greater tuberosity. No aggressive osseous lesion. IMPRESSION: 1. Widening of the acromioclavicular joint measuring 8-10 mm with disruption of the superior acromioclavicular ligament and associated joint effusion. These findings are compatible with [place] type II injury. 2. Partial-thickness tear at the origin of the acromial head of the deltoid muscle with associated grade 2 strain. 3. Diffuse labral degeneration with tear of the superior labrum extending through the posteroinferior labrum. 4. Supraspinatus and infraspinatus tendinosis with partial-thickness articular sided insertional tearing at the junctional fibers of the posterior supraspinatus and anterior infraspinatus tendons. 5. Glenohumeral joint effusion. Electronically Signed   By: Mannie Seek M.D.   On: 09/22/2023 16:57   IR THORACENTESIS ASP PLEURAL SPACE W/IMG GUIDE Result Date: 09/22/2023 INDICATION: 83 year old male presented to ED after a fall, previous CT showed right pleural effusion. Request for therapeutic and diagnostic thoracentesis. EXAM: ULTRASOUND GUIDED RIGHT THORACENTESIS MEDICATIONS: 10 mL 1% lidocaine  COMPLICATIONS: None immediate. PROCEDURE: An ultrasound guided thoracentesis was thoroughly discussed with the patient and his wife and questions answered. Wife states that patient had thoracentesis at [facility] before. The benefits, risks, alternatives and complications were also discussed. The  patient and his wife understands and wishes to proceed with the procedure. Written consent was obtained. Ultrasound was performed to localize and mark an adequate pocket of fluid in the right chest. The area was then prepped and draped in the normal sterile fashion. 1%  Lidocaine  was used for local anesthesia. Under ultrasound guidance a 6 Fr Safe-T-Centesis catheter was introduced. Thoracentesis was performed. The catheter was removed and a dressing applied. FINDINGS: A total of approximately 1650 mL of hazy amber fluid was removed. Samples were sent to the laboratory as requested by the clinical team. Post procedure chest X-ray reviewed, negative for pneumothorax. IMPRESSION: Successful ultrasound guided right thoracentesis yielding 1650 mL of pleural fluid. Performed by: Aimee Han, PA-C Electronically Signed   By: Nicoletta Barrier M.D.   On: 09/22/2023 16:05   DG Chest 1 View Result Date: 09/22/2023 CLINICAL DATA:  [id number] Status post thoracentesis [id number]. EXAM: CHEST  1 VIEW COMPARISON:  09/21/2023. FINDINGS: There is small right pleural effusion with associated compressive atelectatic changes in the right lung. There is significant interval decrease in the amount of effusion, since the prior study, status post thoracentesis. No pneumothorax. Bilateral lung fields are otherwise clear. Left lateral costophrenic angle is clear. Stable cardio-mediastinal silhouette. No acute osseous abnormalities. Right reverse shoulder arthroplasty noted. The soft tissues are within normal limits. IMPRESSION: *Significant interval decrease in the amount of right pleural effusion, status post thoracentesis. No pneumothorax. Electronically Signed   By: Beula Brunswick M.D.   On: 09/22/2023 15:44       Feliciana Horn M.D. [facility] 09/23/2023, 3:58 PM  Available via Epic secure chat 7am-7pm After 7 pm, please refer to night coverage provider listed on amion.

## 2023-09-23 NOTE — TOC Initial Note (Addendum)
 Transition of Care [facility]) - Initial/Assessment Note    Patient Details  Name: [name] MRN: [id number] Date of Birth: 1940-10-03  [facility]) CM/SW Contact:    Ernst Heap Phone Number: [phone] 09/23/2023, 11:04 AM  Clinical Narrative:  10:51 AM- CSW attempted to meet with the patient at bedside. Patient appeared to be sleepy and was in/out. CSW asked the patient ig it was ok for family to be contacted, he said yes.   11:04 AM- CSW called the patients wife and left a VM.   11:08 AM-  CSW received a call back from the patients wife, [name]. [name] stated that she lives with the patient and their adult son recently moved down to help them out. [name] stated that the patient uses a walker and cane at home. [name] stated that the patient has a history of HH services, which ended abruptly 2-3 weeks ago. [name] could not remember the name of the agency. [name] stated that they have a scale at home. [name] stated that she or their son will provide transportation home at dc.   TOC will continue following.                        Patient Goals and CMS Choice            Expected Discharge Plan and Services                                              Prior Living Arrangements/Services                       Activities of Daily Living   ADL Screening (condition at time of admission) Independently performs ADLs?: No Does the patient have a NEW difficulty with bathing/dressing/toileting/self-feeding that is expected to last >3 days?: Yes (Initiates electronic notice to provider for possible OT consult) Does the patient have a NEW difficulty with getting in/out of bed, walking, or climbing stairs that is expected to last >3 days?: Yes (Initiates electronic notice to provider for possible PT consult) Does the patient have a NEW difficulty with communication that is expected to last >3 days?: No Is the patient deaf or have difficulty hearing?:  Yes Does the patient have difficulty seeing, even when wearing glasses/contacts?: No Does the patient have difficulty concentrating, remembering, or making decisions?: Yes  Permission Sought/Granted                  Emotional Assessment              Admission diagnosis:  Subarachnoid hemorrhage following injury (HCC) [S06.6XAA] Pleural effusion [J90] SAH (subarachnoid hemorrhage) (HCC) [I60.9] Hypothermia, initial encounter [T68.XXXA] Injury of head, initial encounter [S09.90XA] Fall, initial encounter [W19.XXXA] Patient Active Problem List   Diagnosis Date Noted   Subarachnoid hemorrhage following injury (HCC) 09/21/2023   CKD (chronic kidney disease) stage 4, GFR 15-29 ml/min (HCC) 06/29/2023   Pleural effusion 05/30/2023   Atrial flutter (HCC) 05/23/2023   History of CVA (cerebrovascular accident) 05/23/2023   Hypernatremia 05/23/2023   Pericardial effusion 05/23/2023   Pericarditis 05/23/2023   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   Overweight (BMI 25.0-29.9) 05/05/2023   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, 41-49%) (HCC) 05/05/2023   Acute kidney injury superimposed on chronic kidney disease (HCC)  05/04/2023   Atrial fibrillation with rapid ventricular response (HCC) 05/04/2023   Normocytic anemia 05/03/2023   ST elevation myocardial infarction (STEMI) (HCC) 05/03/2023   Tachycardia 05/03/2023   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/2023   Acute pericarditis 05/02/2023   Altered mental status 04/21/2023   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, of the knee, left 04/21/2023   Knee effusion, left 04/21/2023   Acute renal failure with acute 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) 04/17/2023   History of melanoma 04/17/2023   S/P reverse total shoulder arthroplasty, 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 04/17/2023   Hallucinations 04/17/2023   Hyperkalemia 04/17/2023   Acute pain of left knee  04/17/2023   Delirium 04/17/2023   Acute kidney injury (HCC) 04/17/2023   Rotator cuff arthropathy, right 02/01/2023   Rotator cuff tendinitis, right 02/01/2023   Traumatic complete tear of right rotator cuff 02/01/2023   Proteinuria, unspecified 10/19/2022   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   CKD stage 3b, GFR 30-44 ml/min (HCC) 09/13/2022   Cervical spondylosis 10/07/2021   Hypothyroidism 07/23/2021   Low vitamin B12 level 08/09/2020   Irritability 10/18/2019   Gastroesophageal reflux disease 03/13/2019   Acute on chronic anemia 07/04/2018   Leucopenia 02/05/2018   Supraspinatus tendon tear, right, initial encounter 02/16/2017   Infraspinatus tendon tear, right, initial encounter 02/16/2017   Biceps tendon rupture, right, initial encounter 02/16/2017   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   Hypotension 08/15/2016   Mixed hyperlipidemia 08/15/2016   Dizziness 12/30/2015   BPH (benign prostatic hyperplasia) 03/03/2015   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   Primary osteoarthritis of left knee 03/03/2015   H/O neoplasm 01/12/2015   History of nonmelanoma skin cancer 01/12/2015   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   Anemia 07/17/2014   Hepatosplenomegaly 07/17/2014   Neutropenia (HCC) 07/17/2014   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 [place] [address] Phone: [phone] Fax: [phone]  [facility] - [place], [place] - [address] [facility] [place] [address] [place] [address] [place] [address] Phone: [phone] Fax: [phone]  [facility], [place] - [address] [address] [place] [address] Phone: [phone] Fax: [phone]     Social Drivers of Health  (SDOH) Social History: SDOH Screenings   Food Insecurity: No Food Insecurity (09/22/2023)  Housing: Low Risk  (09/22/2023)  Transportation Needs: No Transportation Needs (09/22/2023)  Utilities: Not At Risk (09/22/2023)  Alcohol Screen: Low Risk  (07/12/2021)  Depression (PHQ2-9): Low Risk  (04/04/2023)  Financial Resource Strain: Low Risk  (07/12/2021)  Physical Activity: Inactive (07/12/2021)  Social Connections: Socially Integrated (09/22/2023)  Stress: No Stress Concern Present (07/12/2021)  Tobacco Use: Medium Risk (09/21/2023)  Health Literacy: Medium Risk (06/09/2023)   Received from [facility]   SDOH Interventions:     Readmission Risk Interventions    04/21/2023   10:16 AM  Readmission Risk Prevention Plan  Transportation Screening Complete  HRI or Home Care Consult Complete  Social Work Consult for Recovery Care Planning/Counseling Complete  Palliative Care Screening Not Applicable  Medication Review Oceanographer) Complete

## 2023-09-23 NOTE — Evaluation (Signed)
 Occupational Therapy Evaluation Patient Details Name: [name] MRN: [id number] DOB: December 02, 1940 Today's Date: 09/23/2023   History of Present Illness   83 yo M who presented to [facility] ED 09/21/23 after falling at home. CT shows small acute subarachnoid hemorrhage, MRI of L shoulder noted Supraspinatus and infraspinatus tendinosis with partial-thickness tear. S/p R thoracentesis on 09/22/23. PMH Covid, HLD, HTN, hypothyroidism, myelofibrosis, skin CA, CVA, back surgery     Clinical Impressions PTA pt lived with spouse and per his report he was mod I with SPC at home and ind with ADLs/iADLs. Pt currently presenting with impaired cognition and initiation, also positive for orthostatics. Pt completing transfers with min A +2, remains limited by low BP with OOB mobility and completing Adls with Max A to min A. OT to continue following pt acutely to address listed deficits and help transition to next level of care. Pt noted to have different clinical presentation than PT eval yesterday. Patient has the potential to reach Mod I and demos the ability to tolerate 3 hours of therapy. Pt would benefit from an intensive rehab program to help maximize functional independence.      If plan is discharge home, recommend the following:   A little help with walking and/or transfers;A little help with bathing/dressing/bathroom;Assistance with cooking/housework;Assist for transportation;Direct supervision/assist for medications management;Supervision due to cognitive status;Direct supervision/assist for financial management     Functional Status Assessment   Patient has had a recent decline in their functional status and demonstrates the ability to make significant improvements in function in a reasonable and predictable amount of time.     Equipment Recommendations   None recommended by OT (defer)     Recommendations for Other Services         Precautions/Restrictions    Precautions Precautions: Fall Precaution/Restrictions Comments: caution with L shoulder, partial tear Restrictions Weight Bearing Restrictions Per Provider Order: Yes LUE Weight Bearing Per Provider Order: Non weight bearing Other Position/Activity Restrictions: Verbal order from MD Vijaya for NWB and no gentle ROM of LUE. Awaiting ortho consult     Mobility Bed Mobility Overal bed mobility: Needs Assistance Bed Mobility: Rolling, Supine to Sit, Sit to Supine Rolling: Min assist   Supine to sit: Min assist Sit to supine: Mod assist   General bed mobility comments: cues to sequence    Transfers Overall transfer level: Needs assistance Equipment used: 2 person hand held assist Transfers: Sit to/from Stand, Bed to chair/wheelchair/BSC Sit to Stand: Min assist, +2 physical assistance Stand pivot transfers: Min assist, +2 physical assistance, +2 safety/equipment         General transfer comment: See general comments for BP readings. Pt returned to bed with Mod A +1 assist following continued low BP readings. LUE supported in 90 degree position during trasnfers      Balance Overall balance assessment: History of Falls, Needs assistance Sitting-balance support: No upper extremity supported, Feet supported Sitting balance-Leahy Scale: Fair     Standing balance support: Bilateral upper extremity supported Standing balance-Leahy Scale: Poor Standing balance comment: reliant on ext support                           ADL either performed or assessed with clinical judgement   ADL Overall ADL's : Needs assistance/impaired Eating/Feeding: Bed level;Set up   Grooming: Sitting;Wash/dry face;Minimal assistance   Upper Body Bathing: Sitting;Moderate assistance   Lower Body Bathing: Sitting/lateral leans;Moderate assistance   Upper Body Dressing : Sitting;Maximal  assistance   Lower Body Dressing: Sitting/lateral leans;Maximal assistance   Toilet Transfer: Minimal  assistance;+2 for physical assistance;Stand-pivot;+2 for safety/equipment   Toileting- Clothing Manipulation and Hygiene: Maximal assistance;Sit to/from stand       Functional mobility during ADLs: Minimal assistance;+2 for safety/equipment;+2 for physical assistance       Vision Baseline Vision/History: 0 No visual deficits Additional Comments: Pt able to track without challenge, would benefit from further vision assessment. Returned to supine in bed following orthostatic +     Perception         Praxis Praxis: [facility]       Pertinent Vitals/Pain Pain Assessment Pain Assessment: Faces Faces Pain Scale: Hurts even more Pain Location: L shoulder, neck Pain Descriptors / Indicators: Aching, Discomfort, Grimacing, Guarding Pain Intervention(s): Monitored during session, Limited activity within patient's tolerance, Repositioned     Extremity/Trunk Assessment Upper Extremity Assessment Upper Extremity Assessment: Generalized weakness;RUE deficits/detail;LUE deficits/detail RUE Deficits / Details: hx of R shoulder injury, AROM shoulder flexion ~90 degrees 3/5MMT. LUE Deficits / Details: Partial thicknes tear, AROM very limited.PROM of elbow flex/ext intact LUE: Shoulder pain at rest;Shoulder pain with ROM   Lower Extremity Assessment Lower Extremity Assessment: Generalized weakness   Cervical / Trunk Assessment Cervical / Trunk Assessment: Kyphotic   Communication Communication Communication: Impaired Factors Affecting Communication: Hearing impaired   Cognition Arousal: Alert Behavior During Therapy: WFL for tasks assessed/performed Cognition: No family/caregiver present to determine baseline, Cognition impaired   Orientation impairments: Place Awareness: Intellectual awareness intact, Online awareness impaired Memory impairment (select all impairments):  (NT) Attention impairment (select first level of impairment): Focused attention, Sustained attention Executive  functioning impairment (select all impairments): Initiation, Problem solving                   Following commands: Intact       Cueing  General Comments   Cueing Techniques: Verbal cues;Gestural cues;Tactile cues  Bp sitting EOB reading 120s/93, despite good BP reading pt reporting dizziness. Standing BP 72/51(59) and while in recliner Bp continued to read low in the 70s. Returned pt to supine with readings at 84/52(63). Notified RN and MD.   Exercises     Shoulder Instructions      Home Living Family/patient expects to be discharged to:: Private residence Living Arrangements: Spouse/significant other Available Help at Discharge: Family;Available 24 hours/day Type of Home: House Home Access: Stairs to enter [employer] of Steps: 3 Entrance Stairs-Rails: Right;Left;Can reach both Home Layout: Multi-level;Able to live on main level with bedroom/bathroom (split level) Alternate Level Stairs-Number of Steps: 7 Alternate Level Stairs-Rails: Right Bathroom Shower/Tub: Producer, television/film/video: Handicapped height     Home Equipment: Other (comment);Grab bars - tub/shower;Grab bars - toilet;Cane - quad   Additional Comments: *wide base QC. Pt notes a few falls this year  Lives With: Spouse    Prior Functioning/Environment Prior Level of Function : Independent/Modified Independent;Driving;History of Falls (last six months)             Mobility Comments: Mod I with SPC ADLs Comments: Indep with ADLs but limited with RUE due to pain prior to surgery    OT Problem List: Decreased strength;Impaired balance (sitting and/or standing);Pain;Impaired UE functional use;Decreased range of motion   OT Treatment/Interventions: Self-care/ADL training;Patient/family education;Therapeutic exercise;Therapeutic activities;Balance training;DME and/or AE instruction      OT Goals(Current goals can be found in the care plan section)   Acute Rehab OT  Goals Patient Stated Goal: none stated Time For Goal  Achievement: 10/07/23 Potential to Achieve Goals: Good ADL Goals Pt Will Perform Grooming: with set-up;sitting Pt Will Perform Lower Body Bathing: with set-up;sitting/lateral leans Pt Will Perform Upper Body Dressing: sitting;with min assist (usign hemi dressing technique) Pt Will Perform Lower Body Dressing: with min assist;sitting/lateral leans Pt Will Transfer to Toilet: with contact guard assist;ambulating Additional ADL Goal #3: Pt will demonstrate independence with protecting LUE during mobility   OT Frequency:  Min 2X/week    Co-evaluation              AM-PAC OT "6 Clicks" Daily Activity     Outcome Measure Help from another person eating meals?: A Little Help from another person taking care of personal grooming?: A Little Help from another person toileting, which includes using toliet, bedpan, or urinal?: A Lot Help from another person bathing (including washing, rinsing, drying)?: A Lot Help from another person to put on and taking off regular upper body clothing?: A Lot Help from another person to put on and taking off regular lower body clothing?: A Lot 6 Click Score: 14   End of Session Equipment Utilized During Treatment: Gait belt Nurse Communication: Mobility status  Activity Tolerance: Treatment limited secondary to medical complications (Comment) Patient left: in bed;with call bell/phone within reach;with bed alarm set  OT Visit Diagnosis: Unsteadiness on feet (R26.81);Other abnormalities of gait and mobility (R26.89);Pain;History of falling (Z91.81) Pain - Right/Left: Left Pain - part of body: Shoulder                Time: 0950-1030 OT Time Calculation (min): 40 min Charges:  OT General Charges $OT Visit: 1 Visit OT Evaluation $OT Eval Moderate Complexity: 1 Mod OT Treatments $Therapeutic Activity: 23-37 mins  09/23/2023  AB, OTR/L  Acute Rehabilitation Services  Office: [phone]    Jorene New 09/23/2023, 12:38 PM

## 2023-09-23 NOTE — Progress Notes (Signed)
 PHARMACY - PHYSICIAN COMMUNICATION CRITICAL VALUE ALERT - BLOOD CULTURE IDENTIFICATION (BCID)  [name] is an 83 y.o. male who presented to [facility] on 09/21/2023 s/p fall  Assessment:    1/2 blood cultures growing Staph species--likely contaminant Name of physician (or Provider) Contacted:  Dr. [facility] Lavender  Current antibiotics:  None  Changes to prescribed antibiotics recommended:  No changes at this time  Results for orders placed or performed during the hospital encounter of 09/21/23  Blood Culture ID Panel (Reflexed) (Collected: 09/21/2023  5:29 PM)  Result Value Ref Range   Enterococcus faecalis NOT DETECTED NOT DETECTED   Enterococcus Faecium NOT DETECTED NOT DETECTED   Listeria monocytogenes NOT DETECTED NOT DETECTED   Staphylococcus species DETECTED (A) NOT DETECTED   Staphylococcus aureus (BCID) NOT DETECTED NOT DETECTED   Staphylococcus epidermidis NOT DETECTED NOT DETECTED   Staphylococcus lugdunensis NOT DETECTED NOT DETECTED   Streptococcus species NOT DETECTED NOT DETECTED   Streptococcus agalactiae NOT DETECTED NOT DETECTED   Streptococcus pneumoniae NOT DETECTED NOT DETECTED   Streptococcus pyogenes NOT DETECTED NOT DETECTED   A.calcoaceticus-baumannii NOT DETECTED NOT DETECTED   Bacteroides fragilis NOT DETECTED NOT DETECTED   Enterobacterales NOT DETECTED NOT DETECTED   Enterobacter cloacae complex NOT DETECTED NOT DETECTED   Escherichia coli NOT DETECTED NOT DETECTED   Klebsiella aerogenes NOT DETECTED NOT DETECTED   Klebsiella oxytoca NOT DETECTED NOT DETECTED   Klebsiella pneumoniae NOT DETECTED NOT DETECTED   Proteus species NOT DETECTED NOT DETECTED   Salmonella species NOT DETECTED NOT DETECTED   Serratia marcescens NOT DETECTED NOT DETECTED   Haemophilus influenzae NOT DETECTED NOT DETECTED   Neisseria meningitidis NOT DETECTED NOT DETECTED   Pseudomonas aeruginosa NOT DETECTED NOT DETECTED   Stenotrophomonas maltophilia NOT DETECTED NOT  DETECTED   Candida albicans NOT DETECTED NOT DETECTED   Candida auris NOT DETECTED NOT DETECTED   Candida glabrata NOT DETECTED NOT DETECTED   Candida krusei NOT DETECTED NOT DETECTED   Candida parapsilosis NOT DETECTED NOT DETECTED   Candida tropicalis NOT DETECTED NOT DETECTED   Cryptococcus neoformans/gattii NOT DETECTED NOT DETECTED    [name] 09/23/2023  6:28 AM

## 2023-09-23 NOTE — Consult Note (Signed)
 ORTHOPAEDIC CONSULTATION  REQUESTING PHYSICIAN: Feliciana Horn, MD  Chief Complaint: fall with L shoulder pain  HPI: [name] is a 83 y.o. male who complains of pain in the left shoulder after falling at home on 09/21/23. He is on Eliquis  but has a current head bleed from the fall so it has been stopped. R pleural effusion drained by IR yesterday. His cognitive level has been variable so not the best historian. Didn't mention shoulder pain until yesterday.   Also perservating on severe current skull and neck pain. Didn't want to talk much about anything but that. Says "the pain in my neck is severe and coming out through my eyes making it hard to look at anything".   Imaging shows   - Widening of the acromioclavicular joint measuring 8-10 mm with disruption of the superior acromioclavicular ligament and associated joint effusion. These findings are compatible with [place] type II injury. - Partial-thickness tear at the origin of the acromial head of the deltoid muscle with associated grade 2 strain. - Diffuse labral degeneration with tear of the superior labrum extending through the posteroinferior labrum. - Supraspinatus and infraspinatus tendinosis with partial-thickness articular sided insertional tearing at the junctional fibers of the posterior supraspinatus and anterior infraspinatus tendons. - Glenohumeral joint effusion.    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  Orthopedics was consulted for evaluation.    No history of MI, DVT, PE. + h/o CVA. Previously ambulatory with use of assistive devices.  The patient is living at home with his wife.    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   right forearm, [facility]   Stroke (HCC) 11/2007   R brain subcortical infarct   Past Surgical History:  Procedure Laterality Date   ASPIRATION / INJECTION RENAL CYST  07/08/2015   BACK SURGERY     approx 20- 25 years ago   COLONOSCOPY     EYE SURGERY     cataract both eyes   GAS INSERTION  08/11/2011   Procedure: INSERTION OF GAS;  Surgeon: Rexene Catching, MD;  Location: [facility];  Service: Ophthalmology;  Laterality: Right;  [id number]   IR BONE MARROW BIOPSY & ASPIRATION  08/03/2023   IR THORACENTESIS ASP PLEURAL SPACE W/IMG GUIDE  09/22/2023   LEFT HEART CATH AND CORONARY ANGIOGRAPHY N/A 05/02/2023   Procedure: LEFT HEART CATH AND CORONARY ANGIOGRAPHY;  Surgeon: Sammy Crisp, MD;  Location: [facility] INVASIVE CV LAB;  Service: Cardiovascular;  Laterality: N/A;   REVERSE SHOULDER ARTHROPLASTY Right 04/13/2023   Procedure: REVERSE SHOULDER ARTHROPLASTY;  Surgeon: Elner Hahn, MD;  Location: [facility] ORS;  Service: Orthopedics;  Laterality: Right;   SCLERAL BUCKLE  08/11/2011   Procedure: SCLERAL BUCKLE;  Surgeon: Rexene Catching, MD;  Location: [facility] OR;  Service: Ophthalmology;  Laterality: Right;   VARICOSE VEIN SURGERY  Social History   Socioeconomic History   Marital status: Married    Spouse name: [name]    Number of children: 2   Years of education: 12+   Highest education level: Some college, no degree  Occupational History   Occupation: Fish farm manager: OTHER    Comment: community   Occupation: SELF EMPLOYED    Employer: SELF EMPLOYED  Tobacco Use   Smoking status: Former    Current packs/day: 0.00    Types: Cigarettes    Quit date: 06/06/2010    Years since quitting: 13.3   Smokeless tobacco: Never   Vaping Use   Vaping status: Never Used  Substance and Sexual Activity   Alcohol use: No    Alcohol/week: 0.0 standard drinks of alcohol   Drug use: No   Sexual activity: Not Currently  Other Topics Concern   Not on file  Social History Narrative   Pt lives at home with his family.   Caffeine Use- 2 cups daily   Patient has 2 children.    Patient has some college.    Patient is right handed.          Works full time   Social Drivers of Corporate investment banker Strain: Low Risk  (07/12/2021)   Overall Financial Resource Strain (CARDIA)    Difficulty of Paying Living Expenses: Not hard at all  Food Insecurity: No Food Insecurity (09/22/2023)   Hunger Vital Sign    Worried About Running Out of Food in the Last Year: Never true    Ran Out of Food in the Last Year: Never true  Transportation Needs: No Transportation Needs (09/22/2023)   PRAPARE - Administrator, Civil Service (Medical): No    Lack of Transportation (Non-Medical): No  Physical Activity: Inactive (07/12/2021)   Exercise Vital Sign    Days of Exercise per Week: 0 days    Minutes of Exercise per Session: 0 min  Stress: No Stress Concern Present (07/12/2021)   [employer] of Occupational Health - Occupational Stress Questionnaire    Feeling of Stress : Not at all  Social Connections: Socially Integrated (09/22/2023)   Social Connection and Isolation Panel [NHANES]    Frequency of Communication with Friends and Family: More than three times a week    Frequency of Social Gatherings with Friends and Family: More than three times a week    Attends Religious Services: More than 4 times per year    Active Member of [employer] or Organizations: Yes    Attends Engineer, structural: More than 4 times per year    Marital Status: Married   Family History  Problem Relation Age of Onset   Heart disease Father    Dementia Sister    Diabetes Son    Kidney disease Neg Hx    Prostate cancer Neg Hx     Allergies  Allergen Reactions   Meloxicam Nausea And Vomiting   Prior to Admission medications   Medication Sig Start Date End Date Taking? Authorizing Provider  acetaminophen  (TYLENOL ) 500 MG tablet Take 1,000 mg by mouth every 6 (six) hours as needed for mild pain (pain score 1-3).   Yes [provider]  amiodarone  (PACERONE ) 200 MG tablet Take 200 mg by mouth daily.   Yes [provider]  amLODipine  (NORVASC ) 10 MG tablet Take 1 tablet by mouth daily.   Yes [provider]  apixaban  (ELIQUIS ) 2.5 MG TABS tablet Take 1 tablet (2.5  mg total) by mouth 2 (two) times daily. 06/30/23  Yes Johnson, Megan P, DO  azelastine  (ASTELIN ) 0.1 % nasal spray Place 1 spray into both nostrils 2 (two) times daily. Use in each nostril as directed Patient taking differently: Place 1 spray into both nostrils 2 (two) times daily as needed for rhinitis or allergies. Use in each nostril as directed 03/28/19  Yes Corbin Dess, PA-C  Calcium  Citrate-Vitamin D (CITRACAL + D PO) Take 1 tablet by mouth in the morning.   Yes [provider]  Cholecalciferol (D3-1000 PO) Take 1,000 Units by mouth in the morning.   Yes [provider]  cyanocobalamin  1000 MCG tablet Take 1,000 mcg by mouth daily.   Yes [provider]  cyclobenzaprine  (FLEXERIL ) 10 MG tablet TAKE ONE TABLET BY MOUTH AT BEDTIME Patient taking differently: Take 10 mg by mouth 3 (three) times daily as needed for muscle spasms. 08/16/23  Yes Johnson, Megan P, DO  DULoxetine  (CYMBALTA ) 20 MG capsule Take 1 capsule (20 mg total) by mouth at bedtime. 06/30/23  Yes Johnson, Megan P, DO  furosemide  (LASIX ) 40 MG tablet Take 1 tablet (40 mg total) by mouth daily. 06/30/23  Yes Johnson, Megan P, DO  gabapentin  (NEURONTIN ) 300 MG capsule Take 1 capsule (300 mg total) by mouth 2 (two) times daily. Patient taking differently: Take 300 mg by mouth at bedtime. 06/30/23  Yes Johnson, Megan P, DO  ketoconazole   (NIZORAL ) 2 % cream Apply twice daily to affected area on buttocks until clear 03/29/23  Yes Elta Halter, MD  lenalidomide  (REVLIMID ) 5 MG capsule Take 1 capsule (5 mg total) by mouth daily. Take for 21 days, then hold for 7 days. Repeat every 28 days. Patient taking differently: Take 5 mg by mouth every evening. Take for 21 days, then hold for 7 days. Repeat every 28 days. 08/30/23  Yes Avonne Boettcher, MD  levothyroxine  (SYNTHROID ) 75 MCG tablet Take 75 mcg by mouth daily before breakfast.   Yes [provider]  lidocaine  (LIDODERM ) 5 % Place 1 patch onto the skin daily. Remove & Discard patch within 12 hours or as directed by MD Patient taking differently: Place 1 patch onto the skin daily as needed (for pain). 08/04/23  Yes Johnson, Megan P, DO  Lidocaine -Menthol (NERVIVE ROLL-ON EX) Apply 1 application  topically every 4 (four) hours as needed (mild pain).   Yes [provider]  meclizine  (ANTIVERT ) 25 MG tablet TAKE 1 TABLET BY MOUTH THREE TIMES DAILYAS NEEDED FOR DIZZINESS Patient taking differently: Take 25 mg by mouth 2 (two) times daily. 02/10/23  Yes Cannady, Jolene T, NP  melatonin 3 MG TABS tablet Take 1 tablet (3 mg total) by mouth at bedtime as needed (insomnia). 07/07/23  Yes Johnson, Megan P, DO  metoprolol  succinate (TOPROL -XL) 25 MG 24 hr tablet Take 25 mg by mouth daily.   Yes [provider]  Multiple Vitamin (MULITIVITAMIN WITH MINERALS) TABS Take 1 tablet by mouth in the morning.   Yes [provider]  pantoprazole  (PROTONIX ) 40 MG tablet Take 1 tablet (40 mg total) by mouth daily. 06/30/23  Yes Johnson, Megan P, DO  QUEtiapine  (SEROQUEL ) 25 MG tablet TAKE ONE TABLET BY MOUTH AT BEDTIME 08/28/23  Yes Johnson, Megan P, DO  simvastatin  (ZOCOR ) 40 MG tablet TAKE ONE TABLET BY MOUTH EVERY DAY 05/12/23  Yes Johnson, Megan P, DO  tamsulosin  (FLOMAX ) 0.4 MG CAPS capsule Take 2 capsules (0.8 mg total) by mouth every evening. 06/30/23  Yes Johnson, Megan  P, DO  zinc gluconate 50 MG tablet Take 50 mg by mouth in the morning.   Yes [provider]  calcium  carbonate (OSCAL) 1500 (600 Ca) MG TABS tablet Take by mouth. 06/08/23   [provider]  levothyroxine  (SYNTHROID ) 100 MCG tablet Take 1 tablet (100 mcg total) by mouth daily before breakfast. Patient not taking: Reported on 09/21/2023 08/28/23   Terre Ferri P, DO  predniSONE  (DELTASONE ) 10 MG tablet Take by mouth. Patient not taking: Reported on 09/21/2023 06/08/23   [provider]  traMADol  (ULTRAM ) 50 MG tablet Take by mouth.    [provider]   MR SHOULDER LEFT WO CONTRAST Result Date: 09/22/2023 CLINICAL DATA:  Fall. EXAM: MRI OF THE LEFT SHOULDER WITHOUT CONTRAST TECHNIQUE: Multiplanar, multisequence MR imaging of the shoulder was performed. No intravenous contrast was administered. COMPARISON:  None Available. FINDINGS: Rotator cuff: Supraspinatus and infraspinatus tendinosis with partial-thickness articular sided insertional tearing at the junctional fibers of the posterior supraspinatus and anterior infraspinatus tendons. Teres minor tendon is intact. Subscapularis tendon is intact. Muscles: Partial-thickness tear at the origin of the acromial head of the deltoid muscle with associated intramuscular edema. No intramuscular collection. No significant muscle atrophy. Biceps Long Head: Diminutive appearance with chronic tearing of the intra-articular biceps tendon. Acromioclavicular Joint: Moderate arthropathy of the acromioclavicular joint. Widening of the acromioclavicular joint measuring 8-10 mm with associated joint effusion and disruption of the superior acromioclavicular ligament. No subacromial/subdeltoid bursal fluid. Glenohumeral Joint: Glenohumeral joint effusion. Diffuse thinning of the glenoid articular cartilage. Labrum: Diffuse labral degeneration. Tear of the superior labrum extending through the posteroinferior labrum. Bones: No acute fracture  identified. Subcortical cystic change at the greater tuberosity. No aggressive osseous lesion. IMPRESSION: 1. Widening of the acromioclavicular joint measuring 8-10 mm with disruption of the superior acromioclavicular ligament and associated joint effusion. These findings are compatible with [place] type II injury. 2. Partial-thickness tear at the origin of the acromial head of the deltoid muscle with associated grade 2 strain. 3. Diffuse labral degeneration with tear of the superior labrum extending through the posteroinferior labrum. 4. Supraspinatus and infraspinatus tendinosis with partial-thickness articular sided insertional tearing at the junctional fibers of the posterior supraspinatus and anterior infraspinatus tendons. 5. Glenohumeral joint effusion. Electronically Signed   By: Mannie Seek M.D.   On: 09/22/2023 16:57   IR THORACENTESIS ASP PLEURAL SPACE W/IMG GUIDE Result Date: 09/22/2023 INDICATION: 83 year old male presented to ED after a fall, previous CT showed right pleural effusion. Request for therapeutic and diagnostic thoracentesis. EXAM: ULTRASOUND GUIDED RIGHT THORACENTESIS MEDICATIONS: 10 mL 1% lidocaine  COMPLICATIONS: None immediate. PROCEDURE: An ultrasound guided thoracentesis was thoroughly discussed with the patient and his wife and questions answered. Wife states that patient had thoracentesis at [facility] before. The benefits, risks, alternatives and complications were also discussed. The patient and his wife understands and wishes to proceed with the procedure. Written consent was obtained. Ultrasound was performed to localize and mark an adequate pocket of fluid in the right chest. The area was then prepped and draped in the normal sterile fashion. 1% Lidocaine  was used for local anesthesia. Under ultrasound guidance a 6 Fr Safe-T-Centesis catheter was introduced. Thoracentesis was performed. The catheter was removed and a dressing applied. FINDINGS: A total of approximately 1650 mL  of hazy amber fluid was removed. Samples were sent to the laboratory as requested by the clinical team. Post procedure chest X-ray reviewed, negative for pneumothorax. IMPRESSION: Successful ultrasound guided right thoracentesis yielding 1650 mL  of pleural fluid. Performed by: Aimee Han, PA-C Electronically Signed   By: Nicoletta Barrier M.D.   On: 09/22/2023 16:05   DG Chest 1 View Result Date: 09/22/2023 CLINICAL DATA:  [id number] Status post thoracentesis [id number]. EXAM: CHEST  1 VIEW COMPARISON:  09/21/2023. FINDINGS: There is small right pleural effusion with associated compressive atelectatic changes in the right lung. There is significant interval decrease in the amount of effusion, since the prior study, status post thoracentesis. No pneumothorax. Bilateral lung fields are otherwise clear. Left lateral costophrenic angle is clear. Stable cardio-mediastinal silhouette. No acute osseous abnormalities. Right reverse shoulder arthroplasty noted. The soft tissues are within normal limits. IMPRESSION: *Significant interval decrease in the amount of right pleural effusion, status post thoracentesis. No pneumothorax. Electronically Signed   By: Beula Brunswick M.D.   On: 09/22/2023 15:44    Positive ROS: All other systems have been reviewed and were otherwise negative with the exception of those mentioned in the HPI and as above.  Objective: Labs cbc Recent Labs    09/22/23 0250 09/23/23 0642  WBC 3.7* 3.8*  HGB 9.1* 9.1*  HCT 30.7* 29.0*  PLT 250 222    Labs inflam No results for input(s): "CRP" in the last 72 hours.  Invalid input(s): "ESR"  Labs coag Recent Labs    09/21/23 1432  INR 1.4*    Recent Labs    09/22/23 0250 09/23/23 0642  NA 140 135  K 4.8 4.4  CL 110 105  CO2 21* 18*  GLUCOSE 100* 121*  BUN 56* 54*  CREATININE 3.48* 3.79*  CALCIUM  8.2* 8.0*    Physical Exam: Vitals:   09/23/23 0749 09/23/23 1258  BP: 96/61 (!) 85/62  Pulse: 86 84  Resp: 16 17  Temp: 98.6 F (37  C) 97.8 F (36.6 C)  SpO2: 96% 97%   General: Alert, no acute distress. Sitting up in bed, calm, uncomfortable, not moving head or neck at all Mental status: Alert and Oriented x3  Neurologic: Speech Clear and organized, no gross focal findings or movement disorder appreciated. Respiratory: No cyanosis, no use of accessory musculature Cardiovascular: No pedal edema GI: Abdomen is soft and non-tender, non-distended. Skin: Warm and dry. Wound to left forehead covered Extremities: Warm and well perfused w/o edema Psychiatric: Patient is competent for consent with normal mood and affect  MUSCULOSKELETAL:  L shoulder TTP at [facility] joint and deltoid region, no edema, erythema, or ecchymosis seen, limited if any ROM tolerated at left shoulder joint, decreased ROM left elbow and hand, was able to slightly squeeze my fingers, unable to perform cuff specific testing, NVI  Neck TTP at occipital region through cervical spinous processes, refuses any neck ROM  Other extremities are atraumatic with painless ROM and NVI.   Assessment / Plan: Principal Problem:   Subarachnoid hemorrhage following injury (HCC) Active Problems:   BPH (benign prostatic hyperplasia)   Benign hypertensive renal disease   Primary myelofibrosis (HCC)   Large Bilateral Renal Cysts   Hyperkalemia    Has a left AC joint separation, deltoid strain, labral tear, and rotator cuff tear. Some of which was likely present before the fall due to age and normal wear and tear. Not an urgent surgical issue to attend to especially considering his other current more pressing medical issues. Can f/u on outpatient basis in our office.   Will let primary team handle his neck and head complaints. May want to have reconsult neuro and get their input on it. May be symptoms  from his SAH. Last head imaging was 09/21/23 so may want updated images to rule out worsening of it causing his severe pain.    Weightbearing: NWB LUE Orthopedic  device(s):  sling for comfort Showering: fall risk VTE prophylaxis:  Eliquis  on hold   , SCDs, ambulation Pain control: PRN, limit narcotics Follow - up plan:  1-2 weeks in our office Contact information:  Randal Bury MD, [facility] PA-C  Lore Rode PA-C Office [phone] 09/23/2023 3:51 PM

## 2023-09-24 ENCOUNTER — Inpatient Hospital Stay (HOSPITAL_COMMUNITY)

## 2023-09-24 DIAGNOSIS — S066X9D Traumatic subarachnoid hemorrhage with loss of consciousness of unspecified duration, subsequent encounter: Secondary | ICD-10-CM | POA: Diagnosis not present

## 2023-09-24 DIAGNOSIS — J90 Pleural effusion, not elsewhere classified: Secondary | ICD-10-CM | POA: Diagnosis not present

## 2023-09-24 DIAGNOSIS — N281 Cyst of kidney, acquired: Secondary | ICD-10-CM | POA: Diagnosis not present

## 2023-09-24 DIAGNOSIS — N401 Enlarged prostate with lower urinary tract symptoms: Secondary | ICD-10-CM | POA: Diagnosis not present

## 2023-09-24 LAB — BASIC METABOLIC PANEL WITH GFR
Anion gap: 13 (ref 5–15)
BUN: 54 mg/dL — ABNORMAL HIGH (ref 8–23)
CO2: 17 mmol/L — ABNORMAL LOW (ref 22–32)
Calcium: 7.6 mg/dL — ABNORMAL LOW (ref 8.9–10.3)
Chloride: 107 mmol/L (ref 98–111)
Creatinine, Ser: 4.01 mg/dL — ABNORMAL HIGH (ref 0.61–1.24)
GFR, Estimated: 14 mL/min — ABNORMAL LOW (ref >60)
Glucose, Bld: 121 mg/dL — ABNORMAL HIGH (ref 70–99)
Potassium: 4.3 mmol/L (ref 3.5–5.1)
Sodium: 137 mmol/L (ref 135–145)

## 2023-09-24 LAB — LACTATE DEHYDROGENASE: LDH: 267 U/L — ABNORMAL HIGH (ref 98–192)

## 2023-09-24 LAB — MAGNESIUM: Magnesium: 1.5 mg/dL — ABNORMAL LOW (ref 1.7–2.4)

## 2023-09-24 MED ORDER — VANCOMYCIN HCL 1500 MG/300ML IV SOLN
1500.0000 mg | Freq: Once | INTRAVENOUS | Status: AC
  Administered 2023-09-24: 1500 mg via INTRAVENOUS
  Filled 2023-09-24: qty 300

## 2023-09-24 MED ORDER — ENSURE ENLIVE PO LIQD
237.0000 mL | Freq: Two times a day (BID) | ORAL | Status: DC
  Administered 2023-09-25 – 2023-09-28 (×5): 237 mL via ORAL

## 2023-09-24 MED ORDER — ADULT MULTIVITAMIN W/MINERALS CH
1.0000 | ORAL_TABLET | Freq: Every day | ORAL | Status: DC
  Administered 2023-09-24 – 2023-09-29 (×5): 1 via ORAL
  Filled 2023-09-24 (×5): qty 1

## 2023-09-24 MED ORDER — TRAMADOL HCL 50 MG PO TABS
50.0000 mg | ORAL_TABLET | Freq: Two times a day (BID) | ORAL | Status: DC | PRN
  Administered 2023-09-24 – 2023-09-26 (×4): 50 mg via ORAL
  Filled 2023-09-24 (×5): qty 1

## 2023-09-24 MED ORDER — PIPERACILLIN-TAZOBACTAM IN DEX 2-0.25 GM/50ML IV SOLN
2.2500 g | Freq: Three times a day (TID) | INTRAVENOUS | Status: DC
  Administered 2023-09-24 – 2023-09-26 (×6): 2.25 g via INTRAVENOUS
  Filled 2023-09-24 (×8): qty 50

## 2023-09-24 MED ORDER — VANCOMYCIN VARIABLE DOSE PER UNSTABLE RENAL FUNCTION (PHARMACIST DOSING): Status: DC

## 2023-09-24 MED ORDER — MORPHINE SULFATE (PF) 2 MG/ML IV SOLN
1.0000 mg | INTRAVENOUS | Status: DC | PRN
  Administered 2023-09-24 – 2023-09-25 (×4): 2 mg via INTRAVENOUS
  Filled 2023-09-24 (×4): qty 1

## 2023-09-24 MED ORDER — MAGNESIUM SULFATE 4 GM/100ML IV SOLN
4.0000 g | Freq: Once | INTRAVENOUS | Status: AC
  Administered 2023-09-24: 4 g via INTRAVENOUS
  Filled 2023-09-24: qty 100

## 2023-09-24 NOTE — Progress Notes (Signed)
 [facility]                                                                               [name], is a 83 y.o. male, DOB - May 23, 1941, WUJ:[id number] Admit date - 09/21/2023    Outpatient Primary MD for the patient is Christian Dupre, DO  LOS - 3  days    Brief summary    [name] is a 83 y.o. male with medical history significant for atrial fibrillation on Eliquis . 83 year old reports that he was in his yard.  He remembers walking up a ramp and the next and he knows he is in the hospital.   The patient was found on the ground in his yard.   He says he was feeling fine prior to the fall.  His wife says they are actually packed and were on their way to the beach later today. Patient is on Eliquis  he was brought in as a level 2 trauma.  CT scan of the head neck abdomen and pelvis done and the only acute abnormality was a small subarachnoid hemorrhage.  Neurosurgery was called and because the area is small with no mass effect or surrounding edema they recommend no follow-up routine imaging.  Patient was also complaining of left shoulder pain. MRI of the left shoulder done and orthopedics consulted for further evaluation.  This morning patient is alert and oriented and reports neck pain.  MRI of the neck ordered and rpeat CT head ordered for follow up   Assessment & Plan    Assessment and Plan:   Small subarachnoid  hemorrhage Neurosurgery consulted recommended to monitor for changes in neurological exam. He is alert and oriented, and no focal deficits.  Continue to monitor the patient off Eliquis . In view of his neck pain, will get MRI of the neck for further evaluation.  Therapy evaluations have been ordered    Neck pain:  No radiculopathy. MRI of the cervical spine ordered for further evaluation.     Large right pleural effusion Ultrasound thoracentesis ordered and 1.6 lit fluid removed.  Pleural fluid protein level is 3.3  and LDH is 230 two of the  light's criteria is met for exudative effusion. Added zosyn  empirically.  Fluid sent for gram stain and culture and for cytology.     Paroxysmal atrial fibrillation Rate controlled, off Eliquis  for the subarachnoid hemorrhage   History of myelofibrosis with iron deficiency anemia Follows up with oncology outpatient    Stage IV CKD Slight worsening of renal parameters.    GPC in blood cultures/ Bacteremia: Vancomycin  added to zosyn . Unclear if its a contaminant.  Repeat cultures ordered.     Iron deficiency anemia   Transfuse to keep it greater than 7.    Leukopenia Continue to monitor, possibly from myelofibrosis   Abnormal thyroid  panel TSH is 30, free t4 is low, patient is on synthroid  75 mcg. Recommend increasing the dose to 100 mcg.  Left shoulder pain.  From the fall.  MRI of the left shoulder shows Partial-thickness tear at the origin of the acromial head of the deltoid muscle with associated grade 2 strain.  Widening of the acromioclavicular  joint measuring 8-10 mm with disruption of the superior acromioclavicular ligament and associated joint effusion. These findings are compatible with [place] type II injury.  Orthopedics consulted for recommendations.  He will probably need a sling.    Hypotension:  Resolved with IV fluids.       Estimated body mass index is 22.32 kg/m as calculated from the following:   Height as of this encounter: 5\' 11"  (1.803 m).   Weight as of this encounter: 72.6 kg.  Code Status: full code.  DVT Prophylaxis:  Place and maintain sequential compression device Start: 09/22/23 1724scd's   Level of Care: Level of care: Telemetry Medical Family Communication: none at bedside.   Disposition Plan:     Remains inpatient appropriate:  pending clinical improvement.   Procedures:  None.  Consultants:   [facility] Palliative care  Antimicrobials:   Anti-infectives (From admission, onward)    Start     Dose/Rate Route  Frequency Ordered Stop   09/24/23 1400  piperacillin -tazobactam (ZOSYN ) IVPB 2.25 g        2.25 g 100 mL/hr over 30 Minutes Intravenous Every 8 hours 09/24/23 1023     09/24/23 1100  vancomycin  (VANCOREADY) IVPB 1500 mg/300 mL        1,500 mg 150 mL/hr over 120 Minutes Intravenous  Once 09/24/23 1004 09/24/23 1345   09/21/23 1630  ceFEPIme  (MAXIPIME ) 2 g in sodium chloride  0.9 % 100 mL IVPB        2 g 200 mL/hr over 30 Minutes Intravenous  Once 09/21/23 1621 09/21/23 1900   09/21/23 1630  vancomycin  (VANCOCIN ) IVPB 1000 mg/200 mL premix        1,000 mg 200 mL/hr over 60 Minutes Intravenous  Once 09/21/23 1621 09/21/23 1913   09/21/23 1630  metroNIDAZOLE  (FLAGYL ) IVPB 500 mg        500 mg 100 mL/hr over 60 Minutes Intravenous  Once 09/21/23 1621 09/21/23 1906        Medications  Scheduled Meds:  amiodarone   200 mg Oral Daily   DULoxetine   20 mg Oral QHS   feeding supplement  237 mL Oral BID BM   lenalidomide   5 mg Oral Q24H   Followed by   [name] ON 10/04/2023] lenalidomide   5 mg Oral Q24H   levothyroxine   100 mcg Oral QAC breakfast   metoprolol  succinate  25 mg Oral Daily   midodrine   5 mg Oral TID WC   multivitamin with minerals  1 tablet Oral Daily   pantoprazole   40 mg Oral Daily   QUEtiapine   25 mg Oral QHS   simvastatin   40 mg Oral QHS   Continuous Infusions:  sodium chloride  75 mL/hr at 09/24/23 0730   piperacillin -tazobactam (ZOSYN )  IV 2.25 g (09/24/23 1435)   PRN Meds:.acetaminophen  **OR** acetaminophen , melatonin, morphine  injection, ondansetron  **OR** ondansetron  (ZOFRAN ) IV, sorbitol , traMADol     Subjective:   Deno Sida was seen and examined today.  Dizziness resolved. Persistent neck pain.   Objective:   Vitals:   09/24/23 0100 09/24/23 0408 09/24/23 0810 09/24/23 1131  BP:  122/64 (!) 115/59 119/66  Pulse:  98 95 88  Resp:  18 18 19   Temp: 98.8 F (37.1 C) 98.4 F (36.9 C) 99.8 F (37.7 C) 98 F (36.7 C)  TempSrc: Oral Oral Oral Oral   SpO2:  95% 94% 97%  Weight:      Height:        Intake/Output Summary (Last 24 hours) at 09/24/2023 1743 Last data  filed at 09/24/2023 0600 Gross per 24 hour  Intake --  Output 1150 ml  Net -1150 ml   Filed Weights   09/21/23 1420  Weight: 72.6 kg     Exam General exam: Appears calm and comfortable  Respiratory system: Clear to auscultation. Respiratory effort normal. Cardiovascular system: S1 & S2 heard, RRR. No JVD, Gastrointestinal system: Abdomen is nondistended, soft and nontender.  Central nervous system: Alert and oriented. No focal neurological deficits. Extremities: Symmetric 5 x 5 power. Skin: No rashes,  Psychiatry:  Mood & affect appropriate.      Data Reviewed:  I have personally reviewed following labs and imaging studies   CBC Lab Results  Component Value Date   WBC 3.8 (L) 09/23/2023   RBC 3.19 (L) 09/23/2023   HGB 9.1 (L) 09/23/2023   HCT 29.0 (L) 09/23/2023   MCV 90.9 09/23/2023   MCH 28.5 09/23/2023   PLT 222 09/23/2023   MCHC 31.4 09/23/2023   RDW 19.3 (H) 09/23/2023   LYMPHSABS 0.5 (L) 09/23/2023   MONOABS 0.7 09/23/2023   EOSABS 0.0 09/23/2023   BASOSABS 0.0 09/23/2023     Last metabolic panel Lab Results  Component Value Date   NA 137 09/24/2023   K 4.3 09/24/2023   CL 107 09/24/2023   CO2 17 (L) 09/24/2023   BUN 54 (H) 09/24/2023   CREATININE 4.01 (H) 09/24/2023   GLUCOSE 121 (H) 09/24/2023   GFRNONAA 14 (L) 09/24/2023   GFRAA 55 (L) 05/25/2020   CALCIUM  7.6 (L) 09/24/2023   PHOS 6.6 (H) 05/05/2023   PROT 5.6 (L) 09/22/2023   ALBUMIN  2.9 (L) 09/22/2023   LABGLOB 1.5 09/13/2022   AGRATIO 2.6 (H) 09/13/2022   BILITOT 0.7 09/22/2023   ALKPHOS 103 09/22/2023   AST 17 09/22/2023   ALT 18 09/22/2023   ANIONGAP 13 09/24/2023    CBG (last 3)  Recent Labs    09/22/23 2356 09/23/23 0512 09/23/23 0754  GLUCAP 103* 116* 119*      Coagulation Profile: Recent Labs  Lab 09/21/23 1432  INR 1.4*     Radiology  Studies: No results found.      Feliciana Horn M.D. [facility] 09/24/2023, 5:43 PM  Available via Epic secure chat 7am-7pm After 7 pm, please refer to night coverage provider listed on amion.

## 2023-09-24 NOTE — Progress Notes (Signed)
 Initial Nutrition Assessment  DOCUMENTATION CODES:   Not applicable  INTERVENTION:  Ensure Enlive po BID, each supplement provides 350 kcal and 20 grams of protein.  Multivitamin with minerals.   Encourage po intake.  NUTRITION DIAGNOSIS:   Increased nutrient needs related to wound healing as evidenced by estimated needs.  GOAL:   Patient will meet greater than or equal to 90% of their needs  MONITOR:   PO intake, Supplement acceptance, Weight trends  REASON FOR ASSESSMENT:   Consult Assessment of nutrition requirement/status  ASSESSMENT:   PMH a.fib presents after fall with L shoulder pain. Adm for small subarachnoid hemorrhage.  RD working remotely and unable to reach pt by phone.   Intake: Nursing flowsheets document meal completions 100% x1.  Medications reviewed and include: Synthroid , protonix ,   Labs reviewed: BUN 54 H, Creatinine 3.79 H, Mag 1.6 L, Al[place]in 1.6 L   Intake/Output Summary (Last 24 hours) at 09/24/2023 0920 Last data filed at 09/24/2023 0600 Gross per 24 hour  Intake 680 ml  Output 1150 ml  Net -470 ml    Weights reviewed. Admit weight 72.6 kg.   NUTRITION - FOCUSED PHYSICAL EXAM:  Defer to f/u  Diet Order:   Diet Order             DIET SOFT Room service appropriate? Yes; Fluid consistency: Thin  Diet effective now                   EDUCATION NEEDS:   Not appropriate for education at this time  Skin:  Skin Assessment: Reviewed RN Assessment (pressure injury/abrasion L face)  Last BM:  4/17  Height:   Ht Readings from Last 1 Encounters:  09/21/23 5\' 11"  (1.803 m)    Weight:   Wt Readings from Last 1 Encounters:  09/21/23 72.6 kg    Ideal Body Weight:     BMI:  Body mass index is 22.32 kg/m.  Estimated Nutritional Needs:   Kcal:  1900-2200  Protein:  110-130  Fluid:  >1.9 L/day or per MD  Laren Player, MPH, RD, LDN Clinical Dietitian Contact information can be found at [facility].

## 2023-09-24 NOTE — Progress Notes (Signed)
 Orthopedic Tech Progress Note Patient Details:  [name] Jul 15, 1940 [id number]  Ortho Devices Type of Ortho Device: Arm sling Ortho Device/Splint Location: LUE Ortho Device/Splint Interventions: Application   Post Interventions Patient Tolerated: Well  [name] 09/24/2023, 8:58 AM

## 2023-09-24 NOTE — Progress Notes (Signed)
 Pharmacy Antibiotic Note  [name] is a 83 y.o. male admitted on 09/21/2023 with [facility] s/p fall. Now found to have bacteremia and CAP.  Pharmacy has been consulted for Vancomycin  and Zosyn  dosing. PMH of CKD4, leukopenia . Patient underwent thoracentesis for R pleural effusion on 4/18 with 1.6L output. Due to unstable renal function, will dose by random vancomycin  levels.   Tmax 102.1. Scr 3.79 (BL 3-4)  Plan: Zosyn  IV 2.25g q8H  Vancomycin  loading dose 1500mg  once (~20mg /kg)  Obtain random Vanc level  Follow up cultures, clinical status  Monitor renal function   Height: 5\' 11"  (180.3 cm) Weight: 72.6 kg (160 lb) IBW/kg (Calculated) : 75.3  Temp (24hrs), Avg:99.2 F (37.3 C), Min:97.8 F (36.6 C), Max:102.1 F (38.9 C)  Recent Labs  Lab 09/21/23 1432 09/21/23 1434 09/22/23 0250 09/23/23 0642  WBC 3.4*  --  3.7* 3.8*  CREATININE 3.77* 4.10* 3.48* 3.79*  LATICACIDVEN  --  0.6  --   --     Estimated Creatinine Clearance: 15.2 mL/min (A) (by C-G formula based on SCr of 3.79 mg/dL (H)).    Allergies  Allergen Reactions   Meloxicam Nausea And Vomiting    Antimicrobials this admission: 4/17 Cefe/flagyl /Vanc x1  Vanc 4/20 > Zosyn  4/20 >  Microbiology results: 4/17 BCx: GP cocci  4/17 BCID 1/2 staph species 4/18 pleural fluid: no organisms seen    Thank you for allowing pharmacy to be a part of this patient's care.  Haillie Radu 09/24/2023 9:55 AM

## 2023-09-25 ENCOUNTER — Inpatient Hospital Stay (HOSPITAL_COMMUNITY)

## 2023-09-25 ENCOUNTER — Telehealth: Payer: Self-pay | Admitting: *Deleted

## 2023-09-25 DIAGNOSIS — Z7189 Other specified counseling: Secondary | ICD-10-CM | POA: Diagnosis not present

## 2023-09-25 DIAGNOSIS — W19XXXA Unspecified fall, initial encounter: Secondary | ICD-10-CM | POA: Diagnosis not present

## 2023-09-25 DIAGNOSIS — S066X9A Traumatic subarachnoid hemorrhage with loss of consciousness of unspecified duration, initial encounter: Secondary | ICD-10-CM

## 2023-09-25 DIAGNOSIS — S066X9D Traumatic subarachnoid hemorrhage with loss of consciousness of unspecified duration, subsequent encounter: Secondary | ICD-10-CM | POA: Diagnosis not present

## 2023-09-25 DIAGNOSIS — D7581 Myelofibrosis: Secondary | ICD-10-CM

## 2023-09-25 DIAGNOSIS — J90 Pleural effusion, not elsewhere classified: Secondary | ICD-10-CM | POA: Diagnosis not present

## 2023-09-25 DIAGNOSIS — N401 Enlarged prostate with lower urinary tract symptoms: Secondary | ICD-10-CM | POA: Diagnosis not present

## 2023-09-25 DIAGNOSIS — N281 Cyst of kidney, acquired: Secondary | ICD-10-CM | POA: Diagnosis not present

## 2023-09-25 LAB — BASIC METABOLIC PANEL WITH GFR
Anion gap: 15 (ref 5–15)
Anion gap: 10 (ref 5–15)
BUN: 59 mg/dL — ABNORMAL HIGH (ref 8–23)
BUN: 60 mg/dL — ABNORMAL HIGH (ref 8–23)
CO2: 17 mmol/L — ABNORMAL LOW (ref 22–32)
CO2: 20 mmol/L — ABNORMAL LOW (ref 22–32)
Calcium: 7.7 mg/dL — ABNORMAL LOW (ref 8.9–10.3)
Calcium: 7.7 mg/dL — ABNORMAL LOW (ref 8.9–10.3)
Chloride: 106 mmol/L (ref 98–111)
Chloride: 106 mmol/L (ref 98–111)
Creatinine, Ser: 4.49 mg/dL — ABNORMAL HIGH (ref 0.61–1.24)
Creatinine, Ser: 4.74 mg/dL — ABNORMAL HIGH (ref 0.61–1.24)
GFR, Estimated: 12 mL/min — ABNORMAL LOW (ref >60)
GFR, Estimated: 12 mL/min — ABNORMAL LOW (ref >60)
Glucose, Bld: 100 mg/dL — ABNORMAL HIGH (ref 70–99)
Glucose, Bld: 146 mg/dL — ABNORMAL HIGH (ref 70–99)
Potassium: 4.8 mmol/L (ref 3.5–5.1)
Potassium: 5.2 mmol/L — ABNORMAL HIGH (ref 3.5–5.1)
Sodium: 138 mmol/L (ref 135–145)
Sodium: 136 mmol/L (ref 135–145)

## 2023-09-25 LAB — CBC
HCT: 24.4 % — ABNORMAL LOW (ref 39.0–52.0)
Hemoglobin: 7.2 g/dL — ABNORMAL LOW (ref 13.0–17.0)
MCH: 27.6 pg (ref 26.0–34.0)
MCHC: 29.5 g/dL — ABNORMAL LOW (ref 30.0–36.0)
MCV: 93.5 fL (ref 80.0–100.0)
Platelets: 190 10*3/uL (ref 150–400)
RBC: 2.61 MIL/uL — ABNORMAL LOW (ref 4.22–5.81)
RDW: 19.4 % — ABNORMAL HIGH (ref 11.5–15.5)
WBC: 2.1 10*3/uL — ABNORMAL LOW (ref 4.0–10.5)
nRBC: 0 % (ref 0.0–0.2)

## 2023-09-25 LAB — MAGNESIUM: Magnesium: 2.2 mg/dL (ref 1.7–2.4)

## 2023-09-25 LAB — CULTURE, BLOOD (ROUTINE X 2)
Culture  Setup Time: NO GROWTH
Special Requests: ADEQUATE

## 2023-09-25 MED ORDER — DEXAMETHASONE SODIUM PHOSPHATE 10 MG/ML IJ SOLN
10.0000 mg | INTRAMUSCULAR | Status: DC
  Administered 2023-09-25 – 2023-09-28 (×4): 10 mg via INTRAVENOUS
  Filled 2023-09-25 (×4): qty 1

## 2023-09-25 MED ORDER — LENALIDOMIDE 5 MG PO CAPS: 5.0000 mg | ORAL_CAPSULE | Freq: Every day | ORAL | 0 refills | Status: DC

## 2023-09-25 MED ORDER — HYDROMORPHONE HCL 1 MG/ML IJ SOLN
0.5000 mg | INTRAMUSCULAR | Status: DC | PRN
  Administered 2023-09-25 – 2023-09-26 (×3): 0.5 mg via INTRAVENOUS
  Filled 2023-09-25 (×4): qty 0.5

## 2023-09-25 MED ORDER — METHOCARBAMOL 500 MG PO TABS
500.0000 mg | ORAL_TABLET | Freq: Three times a day (TID) | ORAL | Status: DC | PRN
  Administered 2023-09-25 – 2023-09-26 (×3): 500 mg via ORAL
  Filled 2023-09-25 (×3): qty 1

## 2023-09-25 NOTE — Consult Note (Signed)
 Consultation Note Date: 09/25/2023   Patient Name: [name]  DOB: [birth year]  MRN: [id number]  Age / Sex: 83 y.o., male  PCP: Christian Dupre, DO Referring Physician: Feliciana Horn, MD  Reason for Consultation: Establishing goals of care  HPI/Patient Profile: 83 y.o. male  with past medical history of afib, CKD4, myelofibrosis, admitted on 09/21/2023 with fall at home.   Patient is on Eliquis  and he was brought in as a level 2 trauma. CT scan of the head neck abdomen and pelvis done and the only acute abnormality was a small subarachnoid hemorrhage. Neurosurgery was called and because the area is small with no mass effect or surrounding edema they recommend no follow-up routine imaging.  Further imaging was done given left shoulder pain, neck pain.  Orthopedics was consulted for further evaluation.  No urgent or emergent need for intervention in the inpatient setting.  PMT has been consulted to assist with goals of care conversation.  Clinical Assessment and Goals of Care:  I have reviewed medical records including EPIC notes, labs and imaging, discussed with RN, assessed the patient and then had a phone conversation with patient's wife [name] to discuss diagnosis prognosis, GOC, EOL wishes, disposition and options.  I introduced Palliative Medicine as specialized medical care for people living with serious illness. It focuses on providing relief from the symptoms and stress of a serious illness. The goal is to improve quality of life for both the patient and the family.  We discussed a brief life review of the patient and then focused on their current illness.  The natural disease trajectory and expectations at EOL were discussed.  I attempted to elicit values and goals of care important to the patient.    Medical History Review and Understanding:  Patient's wife has a good understanding of the severity of his illness.  She tells me that "he does not  realize how sick he is."  While he understands he has "blood cancer" he has the belief that medications will "take care of it" when discussing with his nephrologist about his CKD 4.  Social History: Patient is married and lives with his wife.  They also have a son who has moved down to assist them from his home in [place].  She has an "adopted" daughter who lives next-door.  He is a retired Medical laboratory scientific officer.  Palliative Symptoms: Severe pain in neck, left shoulder secondary to fall  Advance Directives: A detailed discussion regarding advanced directives was had.  Wife reports they have completed HCPOA and living well and she will provide documentation at her earliest convenience.  Primary HCPOA is her nephew, who is a paramedic.   Code Status: Concepts specific to code status, artifical feeding and hydration, and rehospitalization were considered and discussed.   Discussion: Unable to have discussion with patient due to his perseveration on his pain.  Attempted to elicit goals of care, however he then states that he does not have his hearing aids.  Discussed with RN, who reached out to MD for additional medications to administer.  I provided update to patient's wife on the rationale for discontinuation of morphine  in light of his worsening kidney function.  Provided update on labs and current care plan.  She has a good understanding of his high risk for further decline and tells me he "would not give up his life" and does not have a realistic view of his disease burden.  He has a strong aversion to any discussions about potential end-of-life situations  and it took her a lot of effort to even get him to agree to plan for their cemetery plot at [employer].  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  I asked his wife to bring his hearing aid in the hopes that he is willing to participate further goals of care discussions. Patient's wife also shared that they have toward dialysis and discussed in-home  dialysis in the past.  She is not sure of his specific preferences other than wanting to live, as he has been very resistant to these conversations.  She is appreciative of any effort to assist him with anticipatory care needs.   Discussed the importance of continued conversation with family and the medical providers regarding overall plan of care and treatment options, ensuring decisions are within the context of the patient's values and GOCs.   Questions and concerns were addressed. The family was encouraged to call with questions or concerns.  PMT will continue to support holistically.    SUMMARY OF RECOMMENDATIONS   -Continue full code/full scope treatment -Patient has a strong aversion to goals of care discussions despite family's attempts. Will continue efforts at encouraging patient to participate -I have asked patient's wife to bring in advance directive documentation -Psychosocial and emotional support provided -PMT will continue to follow and support  Prognosis:  Guarded  Discharge Planning: To Be Determined      Primary Diagnoses: Present on Admission:  Subarachnoid hemorrhage following injury (HCC)  BPH (benign prostatic hyperplasia)  Benign hypertensive renal disease  Primary myelofibrosis (HCC)  Hyperkalemia    Physical Exam Vitals and nursing note reviewed.  Constitutional:      General: He is not in acute distress.    Appearance: He is ill-appearing.  Cardiovascular:     Rate and Rhythm: Normal rate.  Pulmonary:     Effort: Pulmonary effort is normal. No respiratory distress.  Neurological:     Mental Status: He is alert.     Vital Signs: BP 100/60 (BP Location: Right Leg)   Pulse 73   Temp 97.8 F (36.6 C) (Oral)   Resp 18   Ht 5\' 11"  (1.803 m)   Wt 72.6 kg   SpO2 94%   BMI 22.32 kg/m  Pain Scale: 0-10   Pain Score: 8    SpO2: SpO2: 94 % O2 Device:SpO2: 94 % O2 Flow Rate: .     MDM: high    Christian Wahlert Alroy Jericho, PA-C  Palliative  Medicine Team Team phone # [phone]  Thank you for allowing the Palliative Medicine Team to assist in the care of this patient. Please utilize secure chat with additional questions, if there is no response within 30 minutes please call the above phone number.  Palliative Medicine Team providers are available by phone from 7am to 7pm daily and can be reached through the team cell phone.  Should this patient require assistance outside of these hours, please call the patient's attending physician.

## 2023-09-25 NOTE — Telephone Encounter (Signed)
Needs refil

## 2023-09-25 NOTE — Consult Note (Signed)
 Reason for Consult:neck pain, cervical spondylosis Referring Physician: Mitsuo, Budnick is an 83 y.o. male.  HPI: whom fell on 4/17. He had a very small traumatic SAH. There were no acute injuries attributed to his Cervical spine after looking at his CT. MRI neck was ordered which showed severe spondylitic change. I am consulted based on these findings and his report of pain.  He reports neck pain for many years. Overall he is in poor health.   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   right forearm, [facility]   Stroke (HCC) 11/2007   R brain subcortical infarct    Past Surgical History:  Procedure Laterality Date   ASPIRATION / INJECTION RENAL CYST  07/08/2015   BACK SURGERY     approx 20- 25 years ago   COLONOSCOPY     EYE SURGERY     cataract both eyes   GAS INSERTION  08/11/2011   Procedure: INSERTION OF GAS;  Surgeon: Rexene Catching, MD;  Location: [facility] OR;  Service: Ophthalmology;  Laterality: Right;  C3F8   IR BONE MARROW BIOPSY & ASPIRATION  08/03/2023   IR THORACENTESIS ASP PLEURAL SPACE W/IMG GUIDE  09/22/2023   LEFT HEART CATH AND CORONARY ANGIOGRAPHY N/A 05/02/2023   Procedure: LEFT HEART CATH AND CORONARY ANGIOGRAPHY;  Surgeon: Sammy Crisp, MD;  Location: [facility] INVASIVE CV LAB;  Service: Cardiovascular;  Laterality: N/A;   REVERSE SHOULDER ARTHROPLASTY Right 04/13/2023   Procedure: REVERSE SHOULDER ARTHROPLASTY;  Surgeon: Elner Hahn, MD;   Location: [facility] ORS;  Service: Orthopedics;  Laterality: Right;   SCLERAL BUCKLE  08/11/2011   Procedure: SCLERAL BUCKLE;  Surgeon: Rexene Catching, MD;  Location: [facility] OR;  Service: Ophthalmology;  Laterality: Right;   VARICOSE VEIN SURGERY      Family History  Problem Relation Age of Onset   Heart disease Father    Dementia Sister    Diabetes Son    Kidney disease Neg Hx    Prostate cancer Neg Hx     Social History:  reports that he quit smoking about 13 years ago. His smoking use included cigarettes. He has never used smokeless tobacco. He reports that he does not drink alcohol and does not use drugs.  Allergies:  Allergies  Allergen Reactions   Meloxicam Nausea And Vomiting    Medications: I have reviewed the patient's current medications.  Results for orders placed or performed during the hospital encounter of 09/21/23 (from the past 48 hours)  Culture, blood (Routine X 2) w Reflex to ID Panel     Status: None (Preliminary result)   Collection Time: 09/24/23  2:16 PM   Specimen: BLOOD RIGHT ARM  Result Value Ref Range   Specimen Description BLOOD RIGHT ARM    Special Requests      BOTTLES DRAWN AEROBIC AND ANAEROBIC Blood Culture adequate volume   Culture  NO GROWTH < 24 HOURS Performed at [facility], [address], [place], [place] [address]    Report Status PENDING   Culture, blood (Routine X 2) w Reflex to ID Panel     Status: None (Preliminary result)   Collection Time: 09/24/23  2:16 PM   Specimen: BLOOD RIGHT HAND  Result Value Ref Range   Specimen Description BLOOD RIGHT HAND    Special Requests      BOTTLES DRAWN AEROBIC AND ANAEROBIC Blood Culture adequate volume   Culture      NO GROWTH < 24 HOURS Performed at [facility], [address] [place], [place] [address]    Report Status PENDING   Basic metabolic panel     Status: Abnormal   Collection Time: 09/24/23  2:16 PM  Result Value Ref Range   Sodium 137 135 - 145 mmol/L    Potassium 4.3 3.5 - 5.1 mmol/L   Chloride 107 98 - 111 mmol/L   CO2 17 (L) 22 - 32 mmol/L   Glucose, Bld 121 (H) 70 - 99 mg/dL    Comment: Glucose reference range applies only to samples taken after fasting for at least 8 hours.   BUN 54 (H) 8 - 23 mg/dL   Creatinine, Ser 0.98 (H) 0.61 - 1.24 mg/dL   Calcium  7.6 (L) 8.9 - 10.3 mg/dL   GFR, Estimated 14 (L) >60 mL/min    Comment: (NOTE) Calculated using the CKD-EPI Creatinine Equation (2021)    Anion gap 13 5 - 15    Comment: Performed at [facility], [address] [place], [place] [address]  Lactate dehydrogenase     Status: Abnormal   Collection Time: 09/24/23  2:16 PM  Result Value Ref Range   LDH 267 (H) 98 - 192 U/L    Comment: Performed at [facility], [address] [place], [place] [address]  Magnesium      Status: Abnormal   Collection Time: 09/24/23  2:16 PM  Result Value Ref Range   Magnesium  1.5 (L) 1.7 - 2.4 mg/dL    Comment: Performed at [facility], [address], [place], [place] [address]  CBC     Status: Abnormal   Collection Time: 09/25/23  6:16 AM  Result Value Ref Range   WBC 2.1 (L) 4.0 - 10.5 K/uL   RBC 2.61 (L) 4.22 - 5.81 MIL/uL   Hemoglobin 7.2 (L) 13.0 - 17.0 g/dL   HCT 86.5 (L) 78.4 - 69.6 %   MCV 93.5 80.0 - 100.0 fL   MCH 27.6 26.0 - 34.0 pg   MCHC 29.5 (L) 30.0 - 36.0 g/dL   RDW 29.5 (H) 28.4 - 13.2 %   Platelets 190 150 - 400 K/uL   nRBC 0.0 0.0 - 0.2 %    Comment: Performed at [facility], [address] [place], [place] [address]  Basic metabolic panel     Status: Abnormal   Collection Time: 09/25/23  6:16 AM  Result Value Ref Range   Sodium 138 135 - 145 mmol/L   Potassium 4.8 3.5 - 5.1 mmol/L   Chloride 106 98 - 111 mmol/L   CO2 17 (L) 22 - 32 mmol/L   Glucose, Bld 100 (H) 70 - 99 mg/dL    Comment: Glucose reference range applies only to samples taken after fasting for at least 8 hours.   BUN 59 (H) 8 - 23 mg/dL   Creatinine, Ser 2.72 (  H) 0.61 - 1.24  mg/dL   Calcium  7.7 (L) 8.9 - 10.3 mg/dL   GFR, Estimated 12 (L) >60 mL/min    Comment: (NOTE) Calculated using the CKD-EPI Creatinine Equation (2021)    Anion gap 15 5 - 15    Comment: Performed at [facility], [address], [place], [place] [address]   *Note: Due to a large number of results and/or encounters for the requested time period, some results have not been displayed. A complete set of results can be found in Results Review.    MR CERVICAL SPINE WO CONTRAST Result Date: 09/25/2023 CLINICAL DATA:  acute neck pain, fall. EXAM: MRI CERVICAL SPINE WITHOUT CONTRAST TECHNIQUE: Multiplanar, multisequence MR imaging of the cervical spine was performed. No intravenous contrast was administered. COMPARISON:  None Available. FINDINGS: Alignment: Approximately 4 mm of anterolisthesis of C5 on C6, similar to recent CT. No other sagittal subluxation. Vertebrae: Please see recent CT of the cervical spine for characterization of bony detail. There is heterogeneous marrow without visible marrow edema. No suspicious bone lesions. Cord: Normal cord signal. Posterior Fossa, vertebral arteries, paraspinal tissues: Small volume of prevertebral edema, greatest in the upper cervical spine and extending inferiorly to the cervicothoracic junction. Uplifting of the anterior longitudinal ligament with suspected disruption at C2 (for example see series 2, image 10). Incomplete STIR suppression in areas limits assessment. Disc levels: Severe craniocervical degenerative change. C2-C3: Facet arthropathy. No significant canal or foraminal stenosis. C3-C4: Posterior disc osteophyte complex. Bilateral facet and uncovertebral hypertrophy. Resulting severe right and moderate to severe left foraminal stenosis. Mild canal stenosis. C4-C5: Left greater than right facet and uncovertebral hypertrophy. Resulting moderate to severe left foraminal stenosis and mild right foraminal stenosis. Patent canal. C5-C6: 4 mm of  anterolisthesis of C4 on C5. Posterior disc osteophyte complex with bilateral facet and uncovertebral hypertrophy. Resulting moderate canal stenosis and severe bilateral foraminal stenosis. C6-C7: Left greater than right facet and uncovertebral hypertrophy. Resulting severe left and moderate right foraminal stenosis. Patent canal. C7-T1: Left greater than right facet and uncovertebral hypertrophy. Resulting mild-to-moderate bilateral foraminal stenosis. Patent canal. IMPRESSION: 1. 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 Adjacent small volume of prevertebral edema which extends inferiorly to the cervicothoracic junction. 2. At C5-C6, moderate canal stenosis and severe bilateral foraminal stenosis. 3. At C6-C7, severe left and moderate right foraminal stenosis. 4. At C3-C4, severe right and moderate to severe left foraminal stenosis. 5. At C4-C5, moderate to severe left foraminal stenosis. These results will be called to the ordering clinician or representative by the Radiologist Assistant, and communication documented in the PACS or [employer]. Electronically Signed   By: Stevenson Elbe M.D.   On: 09/25/2023 00:52   CT HEAD WO CONTRAST ( ) Result Date: 09/24/2023 CLINICAL DATA:  Follow-up subarachnoid hemorrhage. EXAM: CT HEAD WITHOUT CONTRAST TECHNIQUE: Contiguous axial images were obtained from the base of the skull through the vertex without intravenous contrast. RADIATION DOSE REDUCTION: This exam was performed according to the departmental dose-optimization program which includes automated exposure control, adjustment of the mA and/or kV according to patient size and/or use of iterative reconstruction technique. COMPARISON:  CT head 09/21/2023 FINDINGS: Brain: Focal subarachnoid hemorrhage involving the sulci within the left parietal lobe are redemonstrated and appear slightly less conspicuous compared to 09/21/2023. No significant mass effect. No midline shift. Stable ventricular  caliber. No new hemorrhage is noted. No evidence of acute infarct. Generalized cerebral atrophy. Vascular: No hyperdense vessel or unexpected calcification. Skull: No acute  fracture. Decreased size of the left frontal scalp hematoma. Sinuses/Orbits: No acute abnormality. Other: None. IMPRESSION: Slightly decreased subarachnoid hemorrhage in the left parietal lobe compared to 09/21/2023. No new hemorrhage. Electronically Signed   By: Rozell Cornet M.D.   On: 09/24/2023 22:33    Review of Systems  Constitutional:  Positive for activity change.  HENT: Negative.    Respiratory: Negative.    Cardiovascular: Negative.   Gastrointestinal: Negative.   Endocrine: Negative.   Genitourinary: Negative.   Musculoskeletal:  Positive for back pain, neck pain and neck stiffness.  Skin: Negative.   Neurological: Negative.   Psychiatric/Behavioral: Negative.     Blood pressure (!) 106/58, pulse 79, temperature 98.4 F (36.9 C), temperature source Oral, resp. rate 18, height 5\' 11"  (1.803 m), weight 72.6 kg, SpO2 92%. Physical Exam Constitutional:      General: He is in acute distress.     Appearance: He is ill-appearing.  HENT:     Head:     Comments: Bruising on head, dressing left frontal region    Right Ear: External ear normal.     Left Ear: External ear normal.     Nose: Nose normal.     Mouth/Throat:     Mouth: Mucous membranes are dry.  Eyes:     Extraocular Movements: Extraocular movements intact.     Pupils: Pupils are equal, round, and reactive to light.  Pulmonary:     Effort: Pulmonary effort is normal.  Abdominal:     General: Abdomen is flat.  Musculoskeletal:     Cervical back: Rigidity and tenderness present.  Skin:    General: Skin is warm and dry.  Neurological:     General: No focal deficit present.     Mental Status: He is alert and oriented to person, place, and time.     Sensory: Sensation is intact.     Motor: Weakness present.     Comments: Gait not  assessed,  left upper extremity in sling Normal coordination right upper extremity     Assessment/Plan: Mr. Lamson has longstanding spondylitic changes in his neck. He is fused from C1-C5 Mri displays rupture of the anterior longitudinal ligament. He however is completely arthrodesed rendering the ligamentous ruptured moot as there is no movement in the upper cervical spine at C2.  There is no critical stenosis of the spinal canal in the Cspine. I am sure his neck is painful. However the risks and possible benefits to me do not justify operative intervention.  Audie Bleacher 09/25/2023, 8:29 PM

## 2023-09-25 NOTE — Progress Notes (Signed)
 Physical Therapy Treatment Patient Details Name: [name] MRN: [id number] DOB: 1940-11-18 Today's Date: 09/25/2023   History of Present Illness 83 yo M who presented to [facility] ED 09/21/23 after falling at home. CT shows small acute subarachnoid hemorrhage, MRI of L shoulder noted Supraspinatus and infraspinatus tendinosis with partial-thickness tear. S/p R thoracentesis on 09/22/23. PMH Covid, HLD, HTN, hypothyroidism, myelofibrosis, skin CA, CVA, back surgery    PT Comments  Patient resting in bed, c/o pain in neck and Lt hand and knee. Pt agreeable to attempt mobility despite pain and RN notified of pt request for pain meds. Patient required min-mod assist for supine to side to sit EOB. Pt reported dizziness initially but resolved with time at EOB. Pt completed LE marching and ankle pumps to facilitate circulation. Patient declining attempts to stand due to Lt knee pain and required max cues with mo-max assist for sequencing lateral scoot towards HOB. Pt returned to supine and repositioned in chair position to encourage upright posture. Alarm on and call bell within reach. Will continue to progress pt as able.     If plan is discharge home, recommend the following: Two people to help with walking and/or transfers;A lot of help with bathing/dressing/bathroom;Assistance with cooking/housework;Direct supervision/assist for medications management;Assist for transportation;Supervision due to cognitive status;A lot of help with walking and/or transfers;Two people to help with bathing/dressing/bathroom;Help with stairs or ramp for entrance   Can travel by private vehicle     No  Equipment Recommendations  None recommended by PT    Recommendations for Other Services       Precautions / Restrictions Precautions Precautions: Fall Recall of Precautions/Restrictions: Intact Precaution/Restrictions Comments: caution with L shoulder, partial tear Required Braces or Orthoses:  Sling Restrictions Weight Bearing Restrictions Per Provider Order: Yes LUE Weight Bearing Per Provider Order: Non weight bearing Other Position/Activity Restrictions: Lt shoulder NWB, gentl ROM, sling for comfort. C2 ALL injury, Dr. Nichole Barker asked neurosurgery and no new restrictions/precautions added     Mobility  Bed Mobility Overal bed mobility: Needs Assistance Bed Mobility: Rolling, Sit to Supine, Sidelying to Sit Rolling: Min assist Sidelying to sit: Mod assist   Sit to supine: Mod assist, Max assist   General bed mobility comments: cues for hand placement wtih roll Rt and mod assist to bring Lt LE off EOB and raise trunk upright. Mod-max for controlled return to supine.    Transfers Overall transfer level: Needs assistance Equipment used: 2 person hand held assist Transfers: Bed to chair/wheelchair/BSC            Lateral/Scoot Transfers: Mod assist, Max assist General transfer comment: pt declining attempts to stand due to knee pain and weakness on Lt. Pt required max cues to sequence lateral scoot to [facility], mod-max assist for lift.    Ambulation/Gait                   Stairs             Wheelchair Mobility     Tilt Bed    Modified Rankin (Stroke Patients Only)       Balance Overall balance assessment: History of Falls, Needs assistance Sitting-balance support: No upper extremity supported, Feet supported Sitting balance-Leahy Scale: Fair     Standing balance support: Bilateral upper extremity supported Standing balance-Leahy Scale: Poor Standing balance comment: reliant on ext support  Communication Communication Communication: Impaired Factors Affecting Communication: Hearing impaired  Cognition Arousal: Alert Behavior During Therapy: WFL for tasks assessed/performed   PT - Cognitive impairments: Orientation, Awareness, Attention, Safety/Judgement, Problem solving                          Following commands: Intact      Cueing Cueing Techniques: Verbal cues, Gestural cues, Tactile cues  Exercises      General Comments        Pertinent Vitals/Pain Pain Assessment Pain Assessment: Faces Faces Pain Scale: Hurts even more Pain Location: Lt hand and knee, neck Pain Descriptors / Indicators: Aching, Discomfort, Grimacing, Guarding Pain Intervention(s): Limited activity within patient's tolerance, Monitored during session, Repositioned, Patient requesting pain meds-RN notified, RN gave pain meds during session    Home Living                          Prior Function            PT Goals (current goals can now be found in the care plan section) Acute Rehab PT Goals Patient Stated Goal: not stated PT Goal Formulation: With patient Time For Goal Achievement: 10/06/23 Potential to Achieve Goals: Fair Progress towards PT goals: Progressing toward goals    Frequency    Min 1X/week      PT Plan      Co-evaluation              AM-PAC PT "6 Clicks" Mobility   Outcome Measure  Help needed turning from your back to your side while in a flat bed without using bedrails?: A Little Help needed moving from lying on your back to sitting on the side of a flat bed without using bedrails?: A Lot Help needed moving to and from a bed to a chair (including a wheelchair)?: A Lot Help needed standing up from a chair using your arms (e.g., wheelchair or bedside chair)?: Total Help needed to walk in hospital room?: Total Help needed climbing 3-5 steps with a railing? : Total 6 Click Score: 10    End of Session Equipment Utilized During Treatment: Gait belt (Sling Lt shoulder) Activity Tolerance: Treatment limited secondary to medical complications (Comment) Patient left: in bed;with call bell/phone within reach;with bed alarm set Nurse Communication: Mobility status PT Visit Diagnosis: Unsteadiness on feet (R26.81);Muscle weakness (generalized)  (M62.81);History of falling (Z91.81);Difficulty in walking, not elsewhere classified (R26.2)     Time: 8295-6213 PT Time Calculation (min) (ACUTE ONLY): 25 min  Charges:    $Therapeutic Activity: 23-37 mins PT General Charges $$ ACUTE PT VISIT: 1 Visit                     Tish Forge, DPT Acute Rehabilitation Services Office [phone]  09/25/23 3:08 PM

## 2023-09-25 NOTE — Care Management Important Message (Signed)
 Important Message  Patient Details  Name: [name] MRN: [id number] Date of Birth: 06-12-1940   Important Message Given:  Yes - Medicare IM     Wynonia Hedges 09/25/2023, 2:34 PM

## 2023-09-25 NOTE — Progress Notes (Signed)

## 2023-09-25 NOTE — Progress Notes (Signed)
 [facility]                                                                               [name], is a 83 y.o. male, DOB - [birth year], ZOX:[id number] Admit date - 09/21/2023    Outpatient Primary MD for the patient is Christian Dupre, DO  LOS - 4  days    Brief summary    [name] is a 83 y.o. male with medical history significant for atrial fibrillation on Eliquis . 83 year old reports that he was in his yard.  He remembers walking up a ramp and the next and he knows he is in the hospital.   The patient was found on the ground in his yard.   He says he was feeling fine prior to the fall.  His wife says they are actually packed and were on their way to the beach later today. Patient is on Eliquis  he was brought in as a level 2 trauma.  CT scan of the head neck abdomen and pelvis done and the only acute abnormality was a small subarachnoid hemorrhage.  Neurosurgery was called and because the area is small with no mass effect or surrounding edema they recommend no follow-up routine imaging.  Patient was also complaining of left shoulder pain. MRI of the left shoulder done and orthopedics consulted for further evaluation.  This morning patient is alert and oriented and reports neck pain.  MRI of the neck ordered and rpeat CT head ordered for follow up. Patient continues to have neck pain. NS re consulted, [name] will see patient today.   Assessment & Plan    Assessment and Plan:   Small subarachnoid  hemorrhage Neurosurgery consulted recommended to monitor for changes in neurological exam. He is alert and oriented, and no focal deficits.  Continue to monitor the patient off Eliquis . Repeat CT head without contrast shows decreased subarachnoid hemorrhage. Therapy evaluations have been ordered    Neck pain:   Left sided neck pain, left shoulder pain and with some tingling and numbness of the left hand.  MRI of the cervical spine ordered for further  evaluation.  Neurosurgery re consulted.  Reviewed the results , will add IV Decadron  for pain control.  Added robaxin  for symptomatic management.     Large right pleural effusion Ultrasound thoracentesis ordered and 1.6 lit fluid removed.  Pleural fluid protein level is 3.3  and LDH is 230 two of the light's criteria is met for exudative effusion. Added zosyn  empirically.  Fluid sent for gram stain and culture. Gram stain is negative Cultures are pending. Cytology is pending.      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  Hydrate and repeat renal parameters .  Check US  renal.  D/c vancomycin  , .     GPC in blood cultures/ Bacteremia: Staph capitis on final report. Suspect its a contaminant. Will d/c vancomycin   Repeat cultures ordered, negative so far.     Iron  deficiency anemia   Baseline hemoglobin ranging between 7 to 9.  Hemoglobin dropped to 7.2.     Leukopenia Continue to monitor, possibly from myelofibrosis   Abnormal thyroid  panel TSH is 30, free t4 is low, patient is on synthroid  75 mcg. Recommend increasing the dose to 100 mcg.  Left shoulder pain.  From the fall.  MRI of the left shoulder shows Partial-thickness tear at the origin of the acromial head of the deltoid muscle with associated grade 2 strain.  Widening of the acromioclavicular joint measuring 8-10 mm with disruption of the superior acromioclavicular ligament and associated joint effusion. These findings are compatible with [place] type II injury.  Orthopedics consulted for recommendations.  He will probably need a sling.    Hypotension:  Resolved with IV fluids.    In view of his multiple medical issues, worsening renal parameters and progressive decline, will request  palliative care consult for goals of care.    Estimated body mass index is 22.32 kg/m as calculated from the following:   Height as of this encounter: 5\' 11"  (1.803 m).   Weight as of this encounter: 72.6 kg.  Code Status: full code.  DVT Prophylaxis:  Place and maintain sequential compression device Start: 09/22/23 1724scd's   Level of Care: Level of care: Telemetry Medical Family Communication: none at bedside. Called family  Disposition Plan:     Remains inpatient appropriate:  pending clinical improvement.   Procedures:  None.  Consultants:   NS Orthopedics Palliative care  Antimicrobials:   Anti-infectives (From admission, onward)    Start     Dose/Rate Route Frequency Ordered Stop   09/24/23 2239  vancomycin  variable dose per unstable renal function (pharmacist dosing)         Does not apply See admin instructions 09/24/23 2239     09/24/23 1400  piperacillin -tazobactam (ZOSYN ) IVPB 2.25 g        2.25 g 100 mL/hr over 30 Minutes Intravenous Every 8 hours 09/24/23 1023     09/24/23 1100  vancomycin  (VANCOREADY) IVPB 1500 mg/300 mL        1,500 mg 150 mL/hr over 120 Minutes Intravenous  Once 09/24/23 1004 09/24/23 1345   09/21/23 1630  ceFEPIme  (MAXIPIME ) 2 g in sodium chloride  0.9 % 100 mL IVPB        2 g 200 mL/hr over 30 Minutes Intravenous  Once 09/21/23 1621 09/21/23 1900   09/21/23 1630  vancomycin  (VANCOCIN ) IVPB 1000 mg/200 mL premix        1,000 mg 200 mL/hr over 60 Minutes Intravenous  Once 09/21/23 1621 09/21/23 1913   09/21/23 1630  metroNIDAZOLE  (FLAGYL ) IVPB 500 mg        500 mg 100 mL/hr over 60 Minutes Intravenous  Once 09/21/23 1621 09/21/23 1906        Medications  Scheduled Meds:  amiodarone   200 mg Oral Daily   DULoxetine   20 mg Oral QHS   feeding supplement  237 mL Oral BID BM   levothyroxine   100 mcg Oral QAC breakfast   metoprolol  succinate  25 mg Oral Daily   midodrine   5 mg Oral TID WC   multivitamin with minerals  1 tablet Oral  Daily   pantoprazole   40 mg Oral Daily   QUEtiapine   25 mg Oral QHS   simvastatin   40 mg Oral QHS   vancomycin  variable dose per unstable renal function (pharmacist dosing)   Does not apply See admin instructions   Continuous Infusions:  sodium  chloride 75 mL/hr at 09/25/23 0406   piperacillin -tazobactam (ZOSYN )  IV 2.25 g (09/25/23 0635)   PRN Meds:.acetaminophen  **OR** acetaminophen , melatonin, morphine  injection, ondansetron  **OR** ondansetron  (ZOFRAN ) IV, sorbitol , traMADol     Subjective:   [name] was seen and examined today. Continues to complain severe neck pain with some improvement with IV dilaudid .   Objective:   Vitals:   09/25/23 0151 09/25/23 0400 09/25/23 0724 09/25/23 0847  BP: (!) 85/56 113/62 (!) 98/56 100/60  Pulse: 68 70 73 73  Resp: 18 16 18    Temp: 98 F (36.7 C) 98.5 F (36.9 C) 97.8 F (36.6 C)   TempSrc: Oral Oral Oral   SpO2: 100% 99% 94%   Weight:      Height:        Intake/Output Summary (Last 24 hours) at 09/25/2023 0930 Last data filed at 09/25/2023 0406 Gross per 24 hour  Intake 2270.66 ml  Output 600 ml  Net 1670.66 ml   Filed Weights   09/21/23 1420  Weight: 72.6 kg     Exam General exam: ill appearing elderly gentleman in distress from neck pain  Respiratory system: air entry fair. On RA.  Cardiovascular system: S1 & S2 heard, irregularly irregular.  Gastrointestinal system: Abdomen is nondistended, soft and nontender. Central nervous system: Alert and oriented.  Extremities: NO CYANOSIS or clubbing.  Skin: bruising over the left side of the face.  Psychiatry: restless.       Data Reviewed:  I have personally reviewed following labs and imaging studies   CBC Lab Results  Component Value Date   WBC 2.1 (L) 09/25/2023   RBC 2.61 (L) 09/25/2023   HGB 7.2 (L) 09/25/2023   HCT 24.4 (L) 09/25/2023   MCV 93.5 09/25/2023   MCH 27.6 09/25/2023   PLT 190 09/25/2023   MCHC 29.5 (L) 09/25/2023   RDW 19.4 (H) 09/25/2023    LYMPHSABS 0.5 (L) 09/23/2023   MONOABS 0.7 09/23/2023   EOSABS 0.0 09/23/2023   BASOSABS 0.0 09/23/2023     Last metabolic panel Lab Results  Component Value Date   NA 137 09/24/2023   K 4.3 09/24/2023   CL 107 09/24/2023   CO2 17 (L) 09/24/2023   BUN 54 (H) 09/24/2023   CREATININE 4.01 (H) 09/24/2023   GLUCOSE 121 (H) 09/24/2023   GFRNONAA 14 (L) 09/24/2023   GFRAA 55 (L) 05/25/2020   CALCIUM  7.6 (L) 09/24/2023   PHOS 6.6 (H) 05/05/2023   PROT 5.6 (L) 09/22/2023   ALBUMIN  2.9 (L) 09/22/2023   LABGLOB 1.5 09/13/2022   AGRATIO 2.6 (H) 09/13/2022   BILITOT 0.7 09/22/2023   ALKPHOS 103 09/22/2023   AST 17 09/22/2023   ALT 18 09/22/2023   ANIONGAP 13 09/24/2023    CBG (last 3)  Recent Labs    09/22/23 2356 09/23/23 0512 09/23/23 0754  GLUCAP 103* 116* 119*      Coagulation Profile: Recent Labs  Lab 09/21/23 1432  INR 1.4*     Radiology Studies: MR CERVICAL SPINE WO CONTRAST Result Date: 09/25/2023 CLINICAL DATA:  acute neck pain, fall. EXAM: MRI CERVICAL SPINE WITHOUT CONTRAST TECHNIQUE: Multiplanar, multisequence MR imaging of the cervical spine was performed. No intravenous contrast was administered. COMPARISON:  None Available. FINDINGS: Alignment: Approximately 4 mm of anterolisthesis of C5 on C6, similar to recent CT. No other sagittal subluxation. Vertebrae: Please see recent CT of the cervical spine for characterization of bony detail. There is heterogeneous marrow without visible marrow edema. No suspicious bone lesions. Cord:  Normal cord signal. Posterior Fossa, vertebral arteries, paraspinal tissues: Small volume of prevertebral edema, greatest in the upper cervical spine and extending inferiorly to the cervicothoracic junction. Uplifting of the anterior longitudinal ligament with suspected disruption at C2 (for example see series 2, image 10). Incomplete STIR suppression in areas limits assessment. Disc levels: Severe craniocervical degenerative change.  C2-C3: Facet arthropathy. No significant canal or foraminal stenosis. C3-C4: Posterior disc osteophyte complex. Bilateral facet and uncovertebral hypertrophy. Resulting severe right and moderate to severe left foraminal stenosis. Mild canal stenosis. C4-C5: Left greater than right facet and uncovertebral hypertrophy. Resulting moderate to severe left foraminal stenosis and mild right foraminal stenosis. Patent canal. C5-C6: 4 mm of anterolisthesis of C4 on C5. Posterior disc osteophyte complex with bilateral facet and uncovertebral hypertrophy. Resulting moderate canal stenosis and severe bilateral foraminal stenosis. C6-C7: Left greater than right facet and uncovertebral hypertrophy. Resulting severe left and moderate right foraminal stenosis. Patent canal. C7-T1: Left greater than right facet and uncovertebral hypertrophy. Resulting mild-to-moderate bilateral foraminal stenosis. Patent canal. IMPRESSION: 1. 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 Adjacent small volume of prevertebral edema which extends inferiorly to the cervicothoracic junction. 2. At C5-C6, moderate canal stenosis and severe bilateral foraminal stenosis. 3. At C6-C7, severe left and moderate right foraminal stenosis. 4. At C3-C4, severe right and moderate to severe left foraminal stenosis. 5. At C4-C5, moderate to severe left foraminal stenosis. These results will be called to the ordering clinician or representative by the Radiologist Assistant, and communication documented in the PACS or [employer]. Electronically Signed   By: Stevenson Elbe M.D.   On: 09/25/2023 00:52   CT HEAD WO CONTRAST ( ) Result Date: 09/24/2023 CLINICAL DATA:  Follow-up subarachnoid hemorrhage. EXAM: CT HEAD WITHOUT CONTRAST TECHNIQUE: Contiguous axial images were obtained from the base of the skull through the vertex without intravenous contrast. RADIATION DOSE REDUCTION: This exam was performed according to the departmental  dose-optimization program which includes automated exposure control, adjustment of the mA and/or kV according to patient size and/or use of iterative reconstruction technique. COMPARISON:  CT head 09/21/2023 FINDINGS: Brain: Focal subarachnoid hemorrhage involving the sulci within the left parietal lobe are redemonstrated and appear slightly less conspicuous compared to 09/21/2023. No significant mass effect. No midline shift. Stable ventricular caliber. No new hemorrhage is noted. No evidence of acute infarct. Generalized cerebral atrophy. Vascular: No hyperdense vessel or unexpected calcification. Skull: No acute fracture. Decreased size of the left frontal scalp hematoma. Sinuses/Orbits: No acute abnormality. Other: None. IMPRESSION: Slightly decreased subarachnoid hemorrhage in the left parietal lobe compared to 09/21/2023. No new hemorrhage. Electronically Signed   By: Rozell Cornet M.D.   On: 09/24/2023 22:33        Feliciana Horn M.D. [facility] 09/25/2023, 9:30 AM  Available via Epic secure chat 7am-7pm After 7 pm, please refer to night coverage provider listed on amion.

## 2023-09-26 ENCOUNTER — Other Ambulatory Visit: Payer: Self-pay

## 2023-09-26 DIAGNOSIS — J90 Pleural effusion, not elsewhere classified: Secondary | ICD-10-CM | POA: Diagnosis not present

## 2023-09-26 DIAGNOSIS — S0990XA Unspecified injury of head, initial encounter: Secondary | ICD-10-CM | POA: Diagnosis not present

## 2023-09-26 DIAGNOSIS — N401 Enlarged prostate with lower urinary tract symptoms: Secondary | ICD-10-CM | POA: Diagnosis not present

## 2023-09-26 DIAGNOSIS — Z7189 Other specified counseling: Secondary | ICD-10-CM | POA: Diagnosis not present

## 2023-09-26 DIAGNOSIS — I609 Nontraumatic subarachnoid hemorrhage, unspecified: Secondary | ICD-10-CM | POA: Diagnosis not present

## 2023-09-26 DIAGNOSIS — N183 Chronic kidney disease, stage 3 unspecified: Secondary | ICD-10-CM

## 2023-09-26 DIAGNOSIS — Z515 Encounter for palliative care: Secondary | ICD-10-CM

## 2023-09-26 DIAGNOSIS — S066X9D Traumatic subarachnoid hemorrhage with loss of consciousness of unspecified duration, subsequent encounter: Secondary | ICD-10-CM | POA: Diagnosis not present

## 2023-09-26 DIAGNOSIS — N281 Cyst of kidney, acquired: Secondary | ICD-10-CM | POA: Diagnosis not present

## 2023-09-26 LAB — CBC
HCT: 24.5 % — ABNORMAL LOW (ref 39.0–52.0)
Hemoglobin: 7.4 g/dL — ABNORMAL LOW (ref 13.0–17.0)
MCH: 28.4 pg (ref 26.0–34.0)
MCHC: 30.2 g/dL (ref 30.0–36.0)
MCV: 93.9 fL (ref 80.0–100.0)
Platelets: 203 10*3/uL (ref 150–400)
RBC: 2.61 MIL/uL — ABNORMAL LOW (ref 4.22–5.81)
RDW: 19.1 % — ABNORMAL HIGH (ref 11.5–15.5)
WBC: 2.5 10*3/uL — ABNORMAL LOW (ref 4.0–10.5)
nRBC: 0 % (ref 0.0–0.2)

## 2023-09-26 LAB — BASIC METABOLIC PANEL WITH GFR
Anion gap: 11 (ref 5–15)
BUN: 64 mg/dL — ABNORMAL HIGH (ref 8–23)
CO2: 16 mmol/L — ABNORMAL LOW (ref 22–32)
Calcium: 7.6 mg/dL — ABNORMAL LOW (ref 8.9–10.3)
Chloride: 107 mmol/L (ref 98–111)
Creatinine, Ser: 4.76 mg/dL — ABNORMAL HIGH (ref 0.61–1.24)
GFR, Estimated: 11 mL/min — ABNORMAL LOW (ref >60)
Glucose, Bld: 161 mg/dL — ABNORMAL HIGH (ref 70–99)
Potassium: 5.6 mmol/L — ABNORMAL HIGH (ref 3.5–5.1)
Sodium: 134 mmol/L — ABNORMAL LOW (ref 135–145)

## 2023-09-26 LAB — CYTOLOGY - NON PAP

## 2023-09-26 LAB — CULTURE, BLOOD (ROUTINE X 2)
Culture: NO GROWTH
Special Requests: ADEQUATE

## 2023-09-26 MED ORDER — SODIUM BICARBONATE 650 MG PO TABS
1300.0000 mg | ORAL_TABLET | Freq: Two times a day (BID) | ORAL | Status: DC
  Administered 2023-09-26 – 2023-09-28 (×5): 1300 mg via ORAL
  Filled 2023-09-26 (×5): qty 2

## 2023-09-26 MED ORDER — LIDOCAINE 5 % EX PTCH
1.0000 | MEDICATED_PATCH | CUTANEOUS | Status: DC
  Administered 2023-09-26 – 2023-09-28 (×3): 1 via TRANSDERMAL
  Filled 2023-09-26 (×4): qty 1

## 2023-09-26 MED ORDER — SODIUM ZIRCONIUM CYCLOSILICATE 10 G PO PACK
10.0000 g | PACK | Freq: Two times a day (BID) | ORAL | Status: AC
Start: 2023-09-26 — End: 2023-09-26
  Administered 2023-09-26 (×2): 10 g via ORAL
  Filled 2023-09-26 (×2): qty 1

## 2023-09-26 NOTE — Plan of Care (Signed)
 Request pain med for neck discomfort. Ate some of meal and drank all of the ensure. Walked to sink with therapy.Sling on LUE.   Problem: Education: Goal: Knowledge of General Education information will improve Description: Including pain rating scale, medication(s)/side effects and non-pharmacologic comfort measures 09/26/2023 2000 by Drew Gentleman, RN Outcome: Progressing 09/26/2023 1957 by Drew Gentleman, RN Outcome: Progressing   Problem: Clinical Measurements: Goal: Will remain free from infection 09/26/2023 2000 by Drew Gentleman, RN Outcome: Progressing 09/26/2023 1957 by Curly Douglas D, RN Outcome: Progressing   Problem: Coping: Goal: Level of anxiety will decrease 09/26/2023 2000 by Drew Gentleman, RN Outcome: Progressing 09/26/2023 1957 by Drew Gentleman, RN Outcome: Progressing   Problem: Safety: Goal: Ability to remain free from injury will improve 09/26/2023 2000 by Drew Gentleman, RN Outcome: Progressing 09/26/2023 1957 by Curly Douglas D, RN Outcome: Progressing   Problem: Skin Integrity: Goal: Risk for impaired skin integrity will decrease 09/26/2023 2000 by Drew Gentleman, RN Outcome: Progressing 09/26/2023 1957 by Drew Gentleman, RN Outcome: Progressing   Problem: Education: Goal: Knowledge of disease or condition will improve 09/26/2023 2000 by Drew Gentleman, RN Outcome: Progressing 09/26/2023 1957 by Drew Gentleman, RN Outcome: Progressing   Problem: Coping: Goal: Will identify appropriate support needs 09/26/2023 2000 by Drew Gentleman, RN Outcome: Progressing 09/26/2023 1957 by Curly Douglas D, RN Outcome: Progressing   Problem: Self-Care: Goal: Ability to participate in self-care as condition permits will improve 09/26/2023 2000 by Drew Gentleman, RN Outcome: Progressing 09/26/2023 1957 by Drew Gentleman, RN Outcome: Progressing

## 2023-09-26 NOTE — Progress Notes (Signed)
 Inpatient Rehab Admissions Coordinator:    I spoke with Pt. Regarding CIR admit. He is interested in getting better but fixated on seeing orthopedic doctor on May 5 for consultation for a knee replacement. I attempted to explain that he  likely cannot get a knee replacement until he is fully recovered from his current injuries but he continued to perseverate on making that appointment. States he will discuss with his wife when she comes back this PM. I will follow up after.  Wandalee Gust, MS, CCC-SLP Rehab Admissions Coordinator  [phone] (celll) [phone] (office)

## 2023-09-26 NOTE — Plan of Care (Signed)

## 2023-09-26 NOTE — Progress Notes (Signed)
 Occupational Therapy Treatment Patient Details Name: [name] MRN: [id number] DOB: 08-02-1940 Today's Date: 09/26/2023   History of present illness 83 yo M who presented to [facility] ED 09/21/23 after falling at home. CT shows small acute subarachnoid hemorrhage, MRI of L shoulder noted Supraspinatus and infraspinatus tendinosis with partial-thickness tear. S/p R thoracentesis on 09/22/23. PMH Covid, HLD, HTN, hypothyroidism, myelofibrosis, skin CA, CVA, back surgery   OT comments  Pt making good progression toward established OT goals, highly motivated to get OOB.  Challenged functional mobility with transfers and short distance gait with pt needing cues for safety. Pt with LLE weakness needing intermittent increased assist for balance. Able to static stand at sink with min A during oral care with min cues to locate grooming items. Pt follows all one step commands, but needs tactile cues to get his attention as hearing aids not donned. Pt remains an excellent candidate for inpatient rehab >3 hours.       If plan is discharge home, recommend the following:  A little help with walking and/or transfers;A little help with bathing/dressing/bathroom;Assistance with cooking/housework;Assist for transportation;Direct supervision/assist for medications management;Supervision due to cognitive status;Direct supervision/assist for financial management   Equipment Recommendations  None recommended by OT (defer)    Recommendations for Other Services Rehab consult    Precautions / Restrictions Precautions Precautions: Fall Recall of Precautions/Restrictions: Intact Precaution/Restrictions Comments: caution with L shoulder, partial tear Required Braces or Orthoses: Sling Restrictions Weight Bearing Restrictions Per Provider Order: Yes LUE Weight Bearing Per Provider Order: Non weight bearing Other Position/Activity Restrictions: Lt shoulder NWB, gentl ROM, sling for comfort. C2 ALL injury, Dr. Nichole Barker asked  neurosurgery and no new restrictions/precautions added       Mobility Bed Mobility Overal bed mobility: Needs Assistance Bed Mobility: Rolling, Sit to Supine, Sidelying to Sit Rolling: Min assist Sidelying to sit: Min assist   Sit to supine: Min assist        Transfers Overall transfer level: Needs assistance Equipment used: 2 person hand held assist Transfers: Sit to/from Stand Sit to Stand: Min assist, From elevated surface (slightly elevated EOB)                 Balance Overall balance assessment: History of Falls, Needs assistance Sitting-balance support: No upper extremity supported, Feet supported Sitting balance-Leahy Scale: Fair     Standing balance support: Bilateral upper extremity supported Standing balance-Leahy Scale: Poor Standing balance comment: reliant on ext support                           ADL either performed or assessed with clinical judgement   ADL Overall ADL's : Needs assistance/impaired     Grooming: Minimal assistance;Standing Grooming Details (indicate cue type and reason): at sink; min A for locating grooming items and intermittent A for balance                 Toilet Transfer: Minimal assistance;+2 for safety/equipment;Ambulation           Functional mobility during ADLs: Minimal assistance;+2 for safety/equipment      Extremity/Trunk Assessment Upper Extremity Assessment Upper Extremity Assessment: Generalized weakness RUE Deficits / Details: hx of R shoulder injury, AROM shoulder flexion ~90 degrees 3/5MMT. LUE Deficits / Details: Partial thicknes tear. AROM of elbow/wrist/hand WFL. some initiation into shoulder mobility, but per order gentle ROM, so provided AAROM flexion/abduction   Lower Extremity Assessment Lower Extremity Assessment: Generalized weakness  Vision   Additional Comments: needing min cues to locate items standing at sink   Perception     Praxis     Communication  Communication Communication: Impaired Factors Affecting Communication: Hearing impaired   Cognition Arousal: Alert Behavior During Therapy: WFL for tasks assessed/performed Cognition: No family/caregiver present to determine baseline, Cognition impaired     Awareness: Intellectual awareness intact, Online awareness impaired Memory impairment (select all impairments): Short-term memory Attention impairment (select first level of impairment): Sustained attention Executive functioning impairment (select all impairments): Problem solving OT - Cognition Comments: Pt follows all one step commands and able to sequence through BADL tasks                 Following commands: Intact        Cueing   Cueing Techniques: Verbal cues, Gestural cues, Tactile cues  Exercises      Shoulder Instructions       General Comments BP 128/64 seated EOB and 112/62 standing    Pertinent Vitals/ Pain       Pain Assessment Pain Assessment: Faces Faces Pain Scale: Hurts even more Pain Location: neck Pain Descriptors / Indicators: Aching, Discomfort, Grimacing, Guarding Pain Intervention(s): Monitored during session, Limited activity within patient's tolerance  Home Living                                          Prior Functioning/Environment              Frequency  Min 2X/week        Progress Toward Goals  OT Goals(current goals can now be found in the care plan section)  Progress towards OT goals: Progressing toward goals  Acute Rehab OT Goals Patient Stated Goal: none stated Time For Goal Achievement: 10/07/23 Potential to Achieve Goals: Good ADL Goals Pt Will Perform Grooming: with set-up;sitting Pt Will Perform Lower Body Bathing: with set-up;sitting/lateral leans Pt Will Perform Upper Body Dressing: sitting;with min assist Pt Will Perform Lower Body Dressing: with min assist;sitting/lateral leans Pt Will Transfer to Toilet: with contact guard  assist;ambulating Additional ADL Goal #3: Pt will demonstrate independence with protecting LUE during mobility  Plan      Co-evaluation                 AM-PAC OT "6 Clicks" Daily Activity     Outcome Measure   Help from another person eating meals?: A Little Help from another person taking care of personal grooming?: A Little Help from another person toileting, which includes using toliet, bedpan, or urinal?: A Lot Help from another person bathing (including washing, rinsing, drying)?: A Lot Help from another person to put on and taking off regular upper body clothing?: A Lot Help from another person to put on and taking off regular lower body clothing?: A Lot 6 Click Score: 14    End of Session Equipment Utilized During Treatment: Gait belt  OT Visit Diagnosis: Unsteadiness on feet (R26.81);Other abnormalities of gait and mobility (R26.89);Pain;History of falling (Z91.81) Pain - part of body:  (neck)   Activity Tolerance Patient tolerated treatment well   Patient Left in bed;with call bell/phone within reach;with bed alarm set   Nurse Communication Mobility status        Time: 1610-9604 OT Time Calculation (min): 27 min  Charges: OT General Charges $OT Visit: 1 Visit OT Treatments $Self Care/Home Management : 23-37 mins  Karilyn Ouch, OTR/L [facility] Acute Rehabilitation Office: [phone]   [name] 09/26/2023, 5:27 PM

## 2023-09-26 NOTE — Progress Notes (Signed)
 [facility]                                                                               [name], is a 83 y.o. male, DOB - 1940/11/13, JXB:[id number] Admit date - 09/21/2023    Outpatient Primary MD for the patient is Christian Dupre, DO  LOS - 5  days    Brief summary    [name] is a 83 y.o. male with medical history significant for atrial fibrillation on Eliquis . 83 year old reports that he was in his yard.  He remembers walking up a ramp and the next and he knows he is in the hospital.   The patient was found on the ground in his yard.   He says he was feeling fine prior to the fall.  His wife says they are actually packed and were on their way to the beach later today. Patient is on Eliquis  he was brought in as a level 2 trauma.  CT scan of the head neck abdomen and pelvis done and the only acute abnormality was a small subarachnoid hemorrhage.  Neurosurgery was called, recommended no follow up.  Patient was also complaining of left shoulder pain. MRI of the left shoulder done and orthopedics consulted for further evaluation.   MRI of the neck ordered and rpeat CT head ordered for follow up. Patient continues to have neck pain. NS re consulted,. [name] saw the patient recommended conservative management at this time.  Assessment & Plan    Assessment and Plan:   Small subarachnoid  hemorrhage Neurosurgery consulted recommended to monitor for changes in neurological exam. He is alert and oriented, and  has no focal deficits.  Continue to monitor the patient off Eliquis . Repeat CT head without contrast shows decreased subarachnoid hemorrhage. Therapy evaluations have been ordered    Neck pain:   Left sided neck pain, left shoulder pain and with some tingling and numbness of the left hand.   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 Moderate cervical stenosis.  Neurosurgery re consulted and recommendations given. Reviewed the results , added  IV Decadron  for pain control.  Added robaxin  for symptomatic management.     Large right pleural effusion Ultrasound thoracentesis ordered and 1.6 lit fluid removed.  Pleural fluid protein level is 3.3  and LDH is 230 one of the light's criteria is met for exudative effusion.  Empirically started him on IV zosyn  on admission and discontinued on 4/22 as his cultures are negative.  Gram stain is negative Cytology is pending.      Paroxysmal atrial fibrillation Rate controlled, off Eliquis  for the subarachnoid hemorrhage   History of myelofibrosis with iron deficiency anemia Follows up with oncology outpatient.    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 Pt received IV contrast on admission, he had an episode of hypotension and was on vancomycin  briefly for GPC in blood cultures. Baseline creatinine around 3.5, currently at 4.76 with hyperkalemia.  Lokelma  ordered.  Recheck renal parameters .  US  renal ruled out hydronephrosis.  Nephrology consulted for recommendations.   .     GPC in blood cultures/ Bacteremia: Staph capitis on final report. Suspect its a contaminant. Will d/c vancomycin   Repeat cultures ordered, negative so far.     Iron deficiency anemia   Baseline hemoglobin ranging between 7 to 9.  Hemoglobin dropped to 7.2.     Leukopenia Continue to monitor, possibly from myelofibrosis   Abnormal thyroid  panel TSH is 30, free t4 is low, patient is on synthroid  75 mcg. Recommend increasing the dose to 100 mcg.  Left shoulder pain.  From the fall.  MRI of the left shoulder shows Partial-thickness tear at the origin of the acromial head of the deltoid muscle with associated grade 2 strain.  Widening of the acromioclavicular joint measuring 8-10 mm  with disruption of the superior acromioclavicular ligament and associated joint effusion. These findings are compatible with [place] type II injury.  Orthopedics consulted, recommended sling and outpatient follow up.    Hypotension:  Resolved with IV fluids.    In view of his multiple medical issues, worsening renal parameters and progressive decline, will request palliative care consult for goals of care.    Estimated body mass index is 22.32 kg/m as calculated from the following:   Height as of this encounter: 5\' 11"  (1.803 m).   Weight as of this encounter: 72.6 kg.  Code Status: full code.  DVT Prophylaxis:  Place and maintain sequential compression device Start: 09/22/23 [id number]'s   Level of Care: Level of care: Telemetry Medical Family Communication:none at bedside.   Disposition Plan:     Remains inpatient appropriate:  pending clinical improvement.   Procedures:  None.  Consultants:   NS Orthopedics Palliative care Nephrology.   Antimicrobials:   Anti-infectives (From admission, onward)    Start     Dose/Rate Route Frequency Ordered Stop   09/24/23 2239  vancomycin  variable dose per unstable renal function (pharmacist dosing)  Status:  Discontinued         Does not apply See admin instructions 09/24/23 2239 09/25/23 1153   09/24/23 1400  piperacillin -tazobactam (ZOSYN ) IVPB 2.25 g        2.25 g 100 mL/hr over 30 Minutes Intravenous Every 8 hours 09/24/23 1023     09/24/23 1100  vancomycin  (VANCOREADY) IVPB 1500 mg/300 mL        1,500 mg 150 mL/hr over 120 Minutes Intravenous  Once 09/24/23 1004 09/24/23 1345   09/21/23 1630  ceFEPIme  (MAXIPIME ) 2 g in sodium chloride  0.9 % 100 mL IVPB        2 g 200 mL/hr over 30 Minutes Intravenous  Once 09/21/23 1621 09/21/23 1900   09/21/23 1630  vancomycin  (VANCOCIN ) IVPB 1000 mg/200 mL premix        1,000 mg 200 mL/hr over 60 Minutes Intravenous  Once 09/21/23 1621 09/21/23 1913   09/21/23 1630  metroNIDAZOLE   (FLAGYL ) IVPB 500 mg        500 mg 100 mL/hr over 60 Minutes Intravenous  Once 09/21/23 1621 09/21/23 1906        Medications  Scheduled Meds:  amiodarone   200 mg Oral Daily   dexamethasone  (DECADRON ) injection  10 mg Intravenous Q24H   DULoxetine   20 mg Oral QHS   feeding supplement  237 mL Oral BID BM   levothyroxine   100 mcg Oral QAC breakfast   midodrine   5 mg Oral TID WC   multivitamin with minerals  1 tablet Oral Daily   pantoprazole   40 mg Oral Daily   QUEtiapine   25 mg Oral QHS   simvastatin   40 mg Oral QHS   sodium bicarbonate   1,300 mg Oral BID   sodium zirconium cyclosilicate   10 g Oral BID   Continuous Infusions:  sodium chloride  75 mL/hr at 09/26/23 2440   piperacillin -tazobactam (ZOSYN )  IV 2.25 g (09/26/23 0641)   PRN Meds:.acetaminophen  **OR** acetaminophen , HYDROmorphone  (DILAUDID ) injection, melatonin, methocarbamol , ondansetron  **OR** ondansetron  (ZOFRAN ) IV, sorbitol , traMADol     Subjective:   Halden Phegley was seen and examined today.  Neck pain is better today.   Objective:   Vitals:   09/25/23 2351 09/26/23 0410 09/26/23 0732 09/26/23 1143  BP: (!) 94/57 (!) 101/59 111/66 108/72  Pulse: 65 (!) 57 (!) 55 (!) 55  Resp: 18 18 20 18   Temp: 97.9 F (36.6 C) (!) 97.4 F (36.3 C) 97.6 F (36.4 C) 97.6 F (36.4 C)  TempSrc: Oral Oral Oral Oral  SpO2: 94% 95% 94% 94%  Weight:      Height:        Intake/Output Summary (Last 24 hours) at 09/26/2023 1303 Last data filed at 09/26/2023 0314 Gross per 24 hour  Intake 1914.32 ml  Output 600 ml  Net 1314.32 ml   Filed Weights   09/21/23 1420  Weight: 72.6 kg     Exam General exam: ill appearing gentleman, in pain, bruising over the left forehead. Respiratory system: dir entry fair. On RA Cardiovascular system: S1 & S2 heard, RRR. No JVD, Gastrointestinal system: Abdomen is nondistended, soft and nontender.  Central nervous system: Alert and oriented to place and person.  Extremities: left  shoulder tenderness and left sided neck tenderness. Skin: No rashes,  Psychiatry: anxious.       Data Reviewed:  I have personally reviewed following labs and imaging studies   CBC Lab Results  Component Value Date   WBC 2.5 (L) 09/26/2023   RBC 2.61 (L) 09/26/2023   HGB 7.4 (L) 09/26/2023   HCT 24.5 (L) 09/26/2023   MCV 93.9 09/26/2023   MCH 28.4 09/26/2023   PLT 203 09/26/2023   MCHC 30.2 09/26/2023   RDW 19.1 (H) 09/26/2023   LYMPHSABS 0.5 (L) 09/23/2023   MONOABS 0.7 09/23/2023   EOSABS 0.0 09/23/2023   BASOSABS 0.0 09/23/2023     Last metabolic panel Lab Results  Component Value Date   NA 134 (L) 09/26/2023   K 5.6 (H) 09/26/2023   CL 107 09/26/2023   CO2 16 (L) 09/26/2023   BUN 64 (H) 09/26/2023   CREATININE 4.76 (H) 09/26/2023   GLUCOSE 161 (H) 09/26/2023   GFRNONAA 11 (L) 09/26/2023   GFRAA 55 (L) 05/25/2020   CALCIUM  7.6 (L) 09/26/2023   PHOS 6.6 (H) 05/05/2023   PROT 5.6 (L) 09/22/2023   ALBUMIN  2.9 (L) 09/22/2023   LABGLOB 1.5 09/13/2022   AGRATIO 2.6 (H) 09/13/2022   BILITOT 0.7 09/22/2023   ALKPHOS 103 09/22/2023   AST 17 09/22/2023   ALT 18 09/22/2023   ANIONGAP 11 09/26/2023    CBG (last 3)  No results for input(s): "GLUCAP" in the last 72 hours.     Coagulation Profile: Recent Labs  Lab 09/21/23 1432  INR 1.4*     Radiology Studies: US  RENAL Result Date: 09/26/2023 CLINICAL DATA:  [id number] AKI (acute kidney injury) (HCC) [id number] EXAM: RENAL / URINARY TRACT ULTRASOUND COMPLETE COMPARISON:  September 21, 2023 FINDINGS: Right Kidney: Renal measurements: 11.6 x 5.9 x  6.3 cm = volume: 224 mL. Multiple cysts, the largest measuring 7.3 x 6.3 x 7.1 cm. Increased cortical echogenicity. No hydronephrosis or nephrolithiasis. Left Kidney: Renal measurements: 12.4 x 5.1 x 5.4 cm = volume: 149 mL. Increased echogenicity No hydronephrosis or nephrolithiasis. Multiple cysts, the largest measures 5.5 x 5.6 x 6.4 cm. Bladder: Appears normal for degree of  bladder distention. Other: Lobular hypoechogenicity in the gallbladder measuring 2.9 x 1.4 x 2.9 cm. 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 IMPRESSION: No hydronephrosis or nephrolithiasis. 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 Electronically Signed   By: Rance Burrows M.D.   On: 09/26/2023 08:20   MR CERVICAL SPINE WO CONTRAST Result Date: 09/25/2023 CLINICAL DATA:  acute neck pain, fall. EXAM: MRI CERVICAL SPINE WITHOUT CONTRAST TECHNIQUE: Multiplanar, multisequence MR imaging of the cervical spine was performed. No intravenous contrast was administered. COMPARISON:  None Available. FINDINGS: Alignment: Approximately 4 mm of anterolisthesis of C5 on C6, similar to recent CT. No other sagittal subluxation. Vertebrae: Please see recent CT of the cervical spine for characterization of bony detail. There is heterogeneous marrow without visible marrow edema. No suspicious bone lesions. Cord: Normal cord signal. Posterior Fossa, vertebral arteries, paraspinal tissues: Small volume of prevertebral edema, greatest in the upper cervical spine and extending inferiorly to the cervicothoracic junction. Uplifting of the anterior longitudinal ligament with suspected disruption at C2 (for example see series 2, image 10). Incomplete STIR suppression in areas limits assessment. Disc levels: Severe craniocervical degenerative change. C2-C3: Facet arthropathy. No significant canal or foraminal stenosis. C3-C4: Posterior disc osteophyte complex. Bilateral facet and uncovertebral hypertrophy. Resulting severe right and moderate to severe left foraminal stenosis. Mild canal stenosis. C4-C5: Left greater than right facet and uncovertebral hypertrophy. Resulting moderate to severe left foraminal stenosis and mild right foraminal stenosis. Patent canal. C5-C6: 4 mm of anterolisthesis of C4 on C5. Posterior disc osteophyte complex with bilateral facet and uncovertebral hypertrophy.  Resulting moderate canal stenosis and severe bilateral foraminal stenosis. C6-C7: Left greater than right facet and uncovertebral hypertrophy. Resulting severe left and moderate right foraminal stenosis. Patent canal. C7-T1: Left greater than right facet and uncovertebral hypertrophy. Resulting mild-to-moderate bilateral foraminal stenosis. Patent canal. IMPRESSION: 1. 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 Adjacent small volume of prevertebral edema which extends inferiorly to the cervicothoracic junction. 2. At C5-C6, moderate canal stenosis and severe bilateral foraminal stenosis. 3. At C6-C7, severe left and moderate right foraminal stenosis. 4. At C3-C4, severe right and moderate to severe left foraminal stenosis. 5. At C4-C5, moderate to severe left foraminal stenosis. These results will be called to the ordering clinician or representative by the Radiologist Assistant, and communication documented in the PACS or [employer]. Electronically Signed   By: Stevenson Elbe M.D.   On: 09/25/2023 00:52   CT HEAD WO CONTRAST ( ) Result Date: 09/24/2023 CLINICAL DATA:  Follow-up subarachnoid hemorrhage. EXAM: CT HEAD WITHOUT CONTRAST TECHNIQUE: Contiguous axial images were obtained from the base of the skull through the vertex without intravenous contrast. RADIATION DOSE REDUCTION: This exam was performed according to the departmental dose-optimization program which includes automated exposure control, adjustment of the mA and/or kV according to patient size and/or use of iterative reconstruction technique. COMPARISON:  CT head 09/21/2023 FINDINGS: Brain: Focal subarachnoid hemorrhage involving the sulci within the left parietal lobe are redemonstrated and appear slightly less conspicuous compared to 09/21/2023. No significant mass effect. No midline shift. Stable ventricular caliber. No new hemorrhage is noted. No  evidence of acute infarct. Generalized cerebral atrophy. Vascular: No  hyperdense vessel or unexpected calcification. Skull: No acute fracture. Decreased size of the left frontal scalp hematoma. Sinuses/Orbits: No acute abnormality. Other: None. IMPRESSION: Slightly decreased subarachnoid hemorrhage in the left parietal lobe compared to 09/21/2023. No new hemorrhage. Electronically Signed   By: Rozell Cornet M.D.   On: 09/24/2023 22:33        Feliciana Horn M.D. [facility] 09/26/2023, 1:03 PM  Available via Epic secure chat 7am-7pm After 7 pm, please refer to night coverage provider listed on amion.

## 2023-09-26 NOTE — Consult Note (Signed)
 Nephrology Consult   Requesting provider: Feliciana Horn, MD Service requesting consult: [facility] hospital[facility] Reason for consult: AKI on CKD IV   Assessment/Recommendations: [name] is a/an 83 y.o. 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   AKI on CKD IV: Hypotension and IV contrast likely worsened underlying CKD IV (baseline ~3.4) that had already been declining in recent months due to recurrent AKIs. Underlying significant proteinuria likely predisposing to significant SCr fluctuation. Agree with supportive hydration and Midodrine . SCr appears to already be plateauing.  -Chart reviewed: Received IV contrast and hypotension to 85/56 now on MIVF and Midodrine  with low-normal pressures. -Continue to monitor daily Cr, Dose meds for GFR -Monitor Daily I/Os, Daily weight  -Maintain MAP>65 for optimal renal perfusion.  -Avoid nephrotoxic medications including NSAIDs -Use synthetic opioids (Fentanyl /Dilaudid ) if needed -Currently no indication for HD  Hyperkalemia Appears chronic in the setting of CKD. Agree with Lokelma . Will add sodium bicarb. Trend BMP  Hypotension Continue Midodrine  and maintain MAP >65.   Anemia due to CKD: -Transfuse for Hgb<7 g/dL -No role for ESA in this setting  Fall - SAH/neck pain/shoulder pain  Per Primary and NSGY  R pleural effusion S/p thoracentesis of 1.6L. Appears recurrent given prior thoracentesis in 05/2023. Defer to Primary.   Jonne Netters, DO [facility], PGY-2 09/26/2023 10:46 AM   _____________________________________________________________________________________ CC: Fall  History of Present Illness: [name] is a/an 83 y.o. male with a past medical history of A-fib on Eliquis , CKD 4, nephrolithiasis, HLD, HTN, hypothyroidism, BPH, CVA and myelofibrosis who presents with fall.  Presented as a level 2 trauma  on 4/17 after falling in his yard, found to have small subarachnoid hemorrhage that was nonsurgical per NSGY.  Admitted for observation given generalized fatigue and confusion.  Found to have large right pleural effusion, drained 1.6L per IR.  Nephrotoxic exposures: -Had CTAP with 75 mL contrast on 4/17. -On Lasix  40 mg daily at home.  Received home medication on 4/18 and 4/19 but was subsequently discontinued. -Received vancomycin  x 2 doses and subsequently switched to Zosyn  every 8 hour. -On tramadol  50 mg every 12 hour for pain management.  Did not receive NSAIDs. -Episode of hypotension to 85/56 on 4/21, received 1L LR bolus with subsequent improvement in BP.  Continued on NS at 75 mL/h and initiated midodrine  5 mg 3 times daily.  Continues to have low normal pressures.  Initial SCr 4.1, briefly improved to SCr 3.48 with IV hydration hydration.  SCr now trended up to 4.76 with borderline oliguric UOP of 600 mL in the past 24 hours.  Developed hyperkalemia on 4/21 PM, K now 5.6. 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  UA significant for proteinuria upon admission.  Nephrology consulted.  Sees nephrology outpatient, Dr. Zelda Hickman in [place].  Last seen 08/21/2023, determined likely new baseline SCr of 3.4.  Kidney failure risk equation 2-year risk 36%, 5-year risk 75.1% based on labs from 08/14/2023. History of chronic hyperkalemia but had not been taking Lokelma .  Denies family history of chronic kidney disease.  Previously took 2 Aleve daily for arthritic pain for "many years" but stopped a few years ago when he started seeing Dr. Zelda Hickman.  Per chart review multiple hospitalizations in the past year including: -11/26 to 12/3 with development of AKI that improved with medical management of pericarditis -12/16 to 1/3 with development of  AKI and found to have nephrotic proteinuria but renal biopsy unable to be performed due to renal cysts.  Episode of  urinary retention requiring Foley placement for short duration.   Medications:  Current Facility-Administered Medications  Medication Dose Route Frequency Provider Last Rate Last Admin   0.9 %  sodium chloride  infusion   Intravenous Continuous Akula, Vijaya, MD 75 mL/hr at 09/26/23 0314 Infusion Verify at 09/26/23 0314   acetaminophen  (TYLENOL ) tablet 500 mg  500 mg Oral Q6H PRN Willadean Hark, MD   500 mg at 09/25/23 1328   Or   acetaminophen  (TYLENOL ) suppository 650 mg  650 mg Rectal Q6H PRN Willadean Hark, MD       amiodarone  (PACERONE ) tablet 200 mg  200 mg Oral Daily Claiborne, Claudia, MD   200 mg at 09/26/23 1610   dexamethasone  (DECADRON ) injection 10 mg  10 mg Intravenous Q24H Akula, Vijaya, MD   10 mg at 09/25/23 1844   DULoxetine  (CYMBALTA ) DR capsule 20 mg  20 mg Oral QHS Claiborne, Claudia, MD   20 mg at 09/25/23 2322   feeding supplement (ENSURE ENLIVE / ENSURE PLUS) liquid 237 mL  237 mL Oral BID BM Akula, Vijaya, MD   237 mL at 09/26/23 0956   HYDROmorphone  (DILAUDID ) injection 0.5 mg  0.5 mg Intravenous Q4H PRN Akula, Vijaya, MD   0.5 mg at 09/25/23 2034   levothyroxine  (SYNTHROID ) tablet 100 mcg  100 mcg Oral QAC breakfast Akula, Vijaya, MD   100 mcg at 09/26/23 9604   melatonin tablet 3 mg  3 mg Oral QHS PRN Willadean Hark, MD   3 mg at 09/24/23 2140   methocarbamol  (ROBAXIN ) tablet 500 mg  500 mg Oral Q8H PRN Akula, Vijaya, MD   500 mg at 09/26/23 5409   midodrine  (PROAMATINE ) tablet 5 mg  5 mg Oral TID WC Akula, Vijaya, MD   5 mg at 09/26/23 8119   multivitamin with minerals tablet 1 tablet  1 tablet Oral Daily Akula, Vijaya, MD   1 tablet at 09/26/23 1478   ondansetron  (ZOFRAN ) tablet 4 mg  4 mg Oral Q6H PRN Willadean Hark, MD       Or   ondansetron  (ZOFRAN ) injection 4 mg  4 mg Intravenous Q6H PRN Willadean Hark, MD   4 mg at 09/24/23 1322   pantoprazole  (PROTONIX ) EC tablet 40 mg  40 mg Oral Daily Claiborne, Claudia, MD   40 mg at 09/26/23 2956    piperacillin -tazobactam (ZOSYN ) IVPB 2.25 g  2.25 g Intravenous Q8H Utomwen, Adesuwa, RPH 100 mL/hr at 09/26/23 0641 2.25 g at 09/26/23 0641   QUEtiapine  (SEROQUEL ) tablet 25 mg  25 mg Oral QHS Claiborne, Claudia, MD   25 mg at 09/25/23 2322   simvastatin  (ZOCOR ) tablet 40 mg  40 mg Oral QHS Claiborne, Claudia, MD   40 mg at 09/25/23 2322   sodium zirconium cyclosilicate  (LOKELMA ) packet 10 g  10 g Oral BID Akula, Vijaya, MD       sorbitol  70 % solution 30 mL  30 mL Oral Daily PRN Willadean Hark, MD       traMADol  (ULTRAM ) tablet 50 mg  50 mg Oral Q12H PRN Akula, Vijaya, MD   50 mg at 09/26/23 2130   Facility-Administered Medications Ordered in Other Encounters  Medication Dose Route Frequency Provider Last Rate Last Admin   epoetin  alfa-epbx (RETACRIT ) injection 10,000 Units  10,000 Units Subcutaneous Once Rao, Archana C, MD       epoetin  alfa-epbx (RETACRIT ) injection 40,000  Units  40,000 Units Subcutaneous Once Avonne Boettcher, MD         ALLERGIES Meloxicam  MEDICAL HISTORY 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   right forearm, EDC   Stroke (HCC) 11/2007   R brain subcortical infarct     SOCIAL HISTORY Social History   Socioeconomic History   Marital status: Married    Spouse name: [name]    Number of children: 2   Years of education: 12+   Highest education level: Some college, no degree  Occupational History   Occupation: Fish farm manager: OTHER     Comment: community   Occupation: SELF EMPLOYED    Employer: SELF EMPLOYED  Tobacco Use   Smoking status: Former    Current packs/day: 0.00    Types: Cigarettes    Quit date: 06/06/2010    Years since quitting: 13.3   Smokeless tobacco: Never  Vaping Use   Vaping status: Never Used  Substance and Sexual Activity   Alcohol use: No    Alcohol/week: 0.0 standard drinks of alcohol   Drug use: No   Sexual activity: Not Currently  Other Topics Concern   Not on file  Social History Narrative   Pt lives at home with his family.   Caffeine Use- 2 cups daily   Patient has 2 children.    Patient has some college.    Patient is right handed.          Works full time   Social Drivers of Corporate investment banker Strain: Low Risk  (07/12/2021)   Overall Financial Resource Strain (CARDIA)    Difficulty of Paying Living Expenses: Not hard at all  Food Insecurity: No Food Insecurity (09/22/2023)   Hunger Vital Sign    Worried About Running Out of Food in the Last Year: Never true    Ran Out of Food in the Last Year: Never true  Transportation Needs: No Transportation Needs (09/22/2023)   PRAPARE - Administrator, Civil Service (Medical): No    Lack of Transportation (Non-Medical): No  Physical Activity: Inactive (07/12/2021)   Exercise Vital Sign    Days of Exercise per Week: 0 days    Minutes of Exercise per Session: 0 min  Stress: No Stress Concern Present (07/12/2021)   [employer] of Occupational Health - Occupational Stress Questionnaire    Feeling of Stress : Not at all  Social Connections: Socially Integrated (09/22/2023)   Social Connection and Isolation Panel [NHANES]    Frequency of Communication with Friends and Family: More than three times a week    Frequency of Social Gatherings with Friends and Family: More than three times a week    Attends Religious Services: More than 4 times per year    Active Member of [employer] or Organizations: Yes    Attends Tax inspector Meetings:  More than 4 times per year    Marital Status: Married  Catering manager Violence: Not At Risk (09/22/2023)   Humiliation, Afraid, Rape, and Kick questionnaire    Fear of Current or Ex-Partner: No    Emotionally Abused: No    Physically Abused: No    Sexually Abused: No     FAMILY HISTORY Family History  Problem Relation Age of Onset   Heart disease Father    Dementia Sister    Diabetes Son    Kidney disease Neg Hx    Prostate cancer Neg Hx       Review of Systems: 12 systems reviewed Otherwise as per HPI, all other systems reviewed and negative  Physical Exam: Vitals:   09/26/23 0410 09/26/23 0732  BP: (!) 101/59 111/66  Pulse: (!) 57 (!) 55  Resp: 18 20  Temp: (!) 97.4 F (36.3 C) 97.6 F (36.4 C)  SpO2: 95% 94%   No intake/output data recorded.  Intake/Output Summary (Last 24 hours) at 09/26/2023 1046 Last data filed at 09/26/2023 0314 Gross per 24 hour  Intake 2154.32 ml  Output 600 ml  Net 1554.32 ml   General: well-appearing, no acute distress HEENT: anicteric sclera, oropharynx clear without lesions.  Bandage on forehead with dried blood. CV: regular rate, normal rhythm, no murmurs, no gallops, no rubs. No peripheral edema Lungs: Normal work of breathing.  Good air movement in anterior lung fields. Abd: soft, non-tender, non-distended.  Foley in place with around 50 mL yellow urine output. Skin: no visible lesions or rashes Psych: alert, engaged, appropriate mood and affect Musculoskeletal: No obvious deformities Neuro: normal speech, no gross focal deficits   Test Results Reviewed Lab Results  Component Value Date   NA 134 (L) 09/26/2023   K 5.6 (H) 09/26/2023   CL 107 09/26/2023   CO2 16 (L) 09/26/2023   BUN 64 (H) 09/26/2023   CREATININE 4.76 (H) 09/26/2023   CALCIUM  7.6 (L) 09/26/2023   ALBUMIN  2.9 (L) 09/22/2023   PHOS 6.6 (H) 05/05/2023    CBC Recent Labs  Lab 09/23/23 0642 09/25/23 0616 09/26/23 0547  WBC  3.8* 2.1* 2.5*  NEUTROABS 2.4  --   --   HGB 9.1* 7.2* 7.4*  HCT 29.0* 24.4* 24.5*  MCV 90.9 93.5 93.9  PLT 222 190 203    I have reviewed all relevant outside healthcare records related to the patient's current hospitalization

## 2023-09-26 NOTE — Progress Notes (Signed)
 Daily Progress Note   Patient Name: [name]       Date: 09/26/2023 DOB: 19-Jun-1940  Age: 83 y.o. MRN#: [id number] Attending Physician: Feliciana Horn, MD Primary Care Physician: [name], DO Admit Date: 09/21/2023  Reason for Consultation/Follow-up: Establishing goals of care  Subjective: Medical records reviewed including progress notes, labs, imaging. Patient assessed at the bedside.  Initially states he is in no pain, feeling "not bad", however then he notes 7/10 pain in his neck.  Discussed with RN and requested as needed IV Dilaudid .  No family was present during my visit.  Created space and opportunity for patient's thoughts and feelings on his current illness.  He reflected on his mobilizing with a cane at the time of his fall, his understanding that he should be using his walker, and his feeling that he was "getting along good" for the most part.  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  He is still driving and making plans for adding a ramp to his beach house.  Explored his care preferences and previous goals of care discussions.  He acknowledges discussion of dialysis in the past and while this is not entirely off the table for him if he would need it, he would prefer not to have to need this.  We discussed his worsening creatinine and high risk for medical complications.  Discussed importance of ongoing goals of care discussions and relaying if he feels there are any limitations or boundaries to his care that he wishes to be honored.  He has never thought much about CODE STATUS, feeding tubes, or other aggressive interventions.  We discussed the recent  loss of his sister few weeks ago.  He is quite focused on getting his knee replaced and is not sure about CIR.  Encouraged him to consider agreeing in light of the above goals and quality of life he was experiencing.  Reviewed his MRI of the neck and shoulder at his request.  Explained rationale for no surgical intervention. He is open to trying medical management and we reviewed PRN schedule of IV dilaudid  as well as option for lidocaine  patch.  Called patient's wife [name] and provided update in the above conversation. She is appreciative of review of results and agrees CIR would be very beneficial for him. She will discuss with him when she arrives to visit.   Questions and concerns addressed. PMT will continue to support holistically.  Length of Stay: 5   Physical Exam Vitals and nursing note reviewed.  Constitutional:      General: He is not in acute distress.    Appearance: He is ill-appearing.  Cardiovascular:     Rate and Rhythm: Bradycardia present.  Pulmonary:     Effort: Pulmonary effort is normal.  Skin:    General: Skin is warm and dry.  Neurological:     Mental Status: He is alert. Mental status is at baseline.  Psychiatric:        Mood and Affect: Mood normal.        Behavior: Behavior normal.             Vital Signs: BP 111/66 (BP Location: Right Arm)   Pulse (!) 55   Temp 97.6 F (36.4 C) (Oral)   Resp 20   Ht 5\' 11"  (1.803 m)   Wt 72.6 kg   SpO2 94%   BMI 22.32 kg/m  SpO2: SpO2: 94 % O2 Device: O2 Device: Room Air O2 Flow Rate:        Palliative Assessment/Data:70-80%   Palliative Care Assessment & Plan   Patient Profile: 83 y.o. male  with past medical history of afib, CKD4, myelofibrosis, admitted on 09/21/2023 with fall at home.    Patient is on Eliquis  and he was brought in as a level 2 trauma. CT scan of the head neck abdomen and pelvis done and the only acute abnormality was a small subarachnoid hemorrhage. Neurosurgery was called and because  the area is small with no mass effect or surrounding edema they recommend no follow-up routine imaging.  Further imaging was done given left shoulder pain, neck pain.  Orthopedics was consulted for further evaluation.  No urgent or emergent need for intervention in the inpatient setting.   PMT has been consulted to assist with goals of care conversation.  Assessment: Goals of care conversation AKI on CKD 4 Large right pleural effusion Acute on chronic neck pain Small SAH Fall  Recommendations/Plan: Continue full code/full scope treatment Patient is very focused on outpatient left knee evaluation.  Goal is for replacement of the left knee, continued work as a Surveyor, minerals and enjoying his quality of life as it was Lidocaine  patches order.  Agree with steroids and muscle relaxants per primary Wife in agreement with CIR and will discuss with him this evening.  Seems most aligned with his goals of care if he is agreeable Ongoing goals of care discussions Psychosocial and emotional support provided PMT will continue to follow and support   Prognosis:  Unable to determine  Discharge Planning: To Be Determined  Care plan was discussed with patient, patient's wife, RN    MDM high         Rayanna Matusik Alroy Jericho, PA-C  Palliative Medicine Team Team phone # [phone]  Thank you for allowing the Palliative Medicine Team to assist in the care of this patient. Please utilize secure chat with additional questions, if there is no response within 30 minutes please call the above phone number.  Palliative Medicine Team providers are available by phone from 7am to 7pm daily and can be reached through the team cell phone.  Should this patient require assistance outside of these hours, please call the patient's attending physician.

## 2023-09-27 ENCOUNTER — Inpatient Hospital Stay: Admitting: Oncology

## 2023-09-27 ENCOUNTER — Inpatient Hospital Stay (HOSPITAL_COMMUNITY)

## 2023-09-27 ENCOUNTER — Inpatient Hospital Stay

## 2023-09-27 DIAGNOSIS — S0990XA Unspecified injury of head, initial encounter: Secondary | ICD-10-CM | POA: Diagnosis not present

## 2023-09-27 DIAGNOSIS — W19XXXA Unspecified fall, initial encounter: Secondary | ICD-10-CM | POA: Diagnosis not present

## 2023-09-27 DIAGNOSIS — T68XXXA Hypothermia, initial encounter: Secondary | ICD-10-CM | POA: Diagnosis not present

## 2023-09-27 DIAGNOSIS — J90 Pleural effusion, not elsewhere classified: Secondary | ICD-10-CM | POA: Diagnosis not present

## 2023-09-27 LAB — RENAL FUNCTION PANEL
Albumin: 2.1 g/dL — ABNORMAL LOW (ref 3.5–5.0)
Anion gap: 13 (ref 5–15)
BUN: 72 mg/dL — ABNORMAL HIGH (ref 8–23)
CO2: 19 mmol/L — ABNORMAL LOW (ref 22–32)
Calcium: 7.4 mg/dL — ABNORMAL LOW (ref 8.9–10.3)
Chloride: 105 mmol/L (ref 98–111)
Creatinine, Ser: 5.01 mg/dL — ABNORMAL HIGH (ref 0.61–1.24)
GFR, Estimated: 11 mL/min — ABNORMAL LOW (ref >60)
Glucose, Bld: 130 mg/dL — ABNORMAL HIGH (ref 70–99)
Phosphorus: 6.6 mg/dL — ABNORMAL HIGH (ref 2.5–4.6)
Potassium: 5.3 mmol/L — ABNORMAL HIGH (ref 3.5–5.1)
Sodium: 137 mmol/L (ref 135–145)

## 2023-09-27 LAB — CBC
HCT: 24.1 % — ABNORMAL LOW (ref 39.0–52.0)
Hemoglobin: 7.3 g/dL — ABNORMAL LOW (ref 13.0–17.0)
MCH: 28.2 pg (ref 26.0–34.0)
MCHC: 30.3 g/dL (ref 30.0–36.0)
MCV: 93.1 fL (ref 80.0–100.0)
Platelets: 253 10*3/uL (ref 150–400)
RBC: 2.59 MIL/uL — ABNORMAL LOW (ref 4.22–5.81)
RDW: 18.9 % — ABNORMAL HIGH (ref 11.5–15.5)
WBC: 2.7 10*3/uL — ABNORMAL LOW (ref 4.0–10.5)
nRBC: 0 % (ref 0.0–0.2)

## 2023-09-27 LAB — CULTURE, BODY FLUID W GRAM STAIN -BOTTLE: Culture: NO GROWTH

## 2023-09-27 MED ORDER — PANTOPRAZOLE SODIUM 40 MG IV SOLR
40.0000 mg | INTRAVENOUS | Status: DC
  Administered 2023-09-27 – 2023-09-28 (×2): 40 mg via INTRAVENOUS
  Filled 2023-09-27 (×2): qty 10

## 2023-09-27 MED ORDER — METOCLOPRAMIDE HCL 5 MG/ML IJ SOLN
5.0000 mg | Freq: Four times a day (QID) | INTRAMUSCULAR | Status: DC
  Administered 2023-09-27 – 2023-09-29 (×7): 5 mg via INTRAVENOUS
  Filled 2023-09-27 (×7): qty 2

## 2023-09-27 NOTE — Progress Notes (Signed)
 SLP Cancellation Note  Patient Details Name: [name] MRN: [id number] DOB: June 12, 1940   Cancelled treatment:       Reason Eval/Treat Not Completed: Medical issues which prohibited therapy, reporting nausea throughout the day today per notes. Pt and his family members were sleeping in room at the time of attempt and did not rouse to loud verbal stimuli. SLP will f/u to continue targeting cognitive-linguistic goals.    Amil Kale, M.A., CCC-SLP Speech Language Pathology, Acute Rehabilitation Services  Secure Chat preferred [phone]  09/27/2023, 5:24 PM

## 2023-09-27 NOTE — Plan of Care (Signed)
  Problem: Education: Goal: Knowledge of General Education information will improve Description: Including pain rating scale, medication(s)/side effects and non-pharmacologic comfort measures Outcome: Progressing   Problem: Health Behavior/Discharge Planning: Goal: Ability to manage health-related needs will improve Outcome: Progressing   Problem: Clinical Measurements: Goal: Ability to maintain clinical measurements within normal limits will improve Outcome: Progressing Goal: Will remain free from infection Outcome: Progressing Goal: Diagnostic test results will improve Outcome: Progressing Goal: Respiratory complications will improve Outcome: Progressing Goal: Cardiovascular complication will be avoided Outcome: Progressing   Problem: Activity: Goal: Risk for activity intolerance will decrease Outcome: Progressing   Problem: Nutrition: Goal: Adequate nutrition will be maintained Outcome: Progressing   Problem: Coping: Goal: Level of anxiety will decrease Outcome: Progressing   Problem: Elimination: Goal: Will not experience complications related to bowel motility Outcome: Progressing Goal: Will not experience complications related to urinary retention Outcome: Progressing   Problem: Pain Managment: Goal: General experience of comfort will improve and/or be controlled Outcome: Progressing   Problem: Safety: Goal: Ability to remain free from injury will improve Outcome: Progressing   Problem: Skin Integrity: Goal: Risk for impaired skin integrity will decrease Outcome: Progressing   Problem: Education: Goal: Knowledge of disease or condition will improve Outcome: Progressing Goal: Knowledge of secondary prevention will improve (MUST DOCUMENT ALL) Outcome: Progressing Goal: Knowledge of patient specific risk factors will improve (DELETE if not current risk factor) Outcome: Progressing   Problem: Ischemic Stroke/TIA Tissue Perfusion: Goal: Complications of  ischemic stroke/TIA will be minimized Outcome: Progressing   Problem: Coping: Goal: Will identify appropriate support needs Outcome: Progressing   Problem: Self-Care: Goal: Ability to participate in self-care as condition permits will improve Outcome: Progressing Goal: Verbalization of feelings and concerns over difficulty with self-care will improve Outcome: Progressing

## 2023-09-27 NOTE — Progress Notes (Signed)
 PT Cancellation Note  Patient Details Name: [name] MRN: [id number] DOB: [birth year]   Cancelled Treatment:    Reason Eval/Treat Not Completed: Other (comment) Pt refusing due to nausea; RN notified  Verdia Glad, PT, DPT Acute Rehabilitation Services Office [phone]    Claria Crofts 09/27/2023, 3:56 PM

## 2023-09-27 NOTE — Progress Notes (Signed)
 Nephrology Follow-Up Consult note   Assessment/Recommendations: [name] is a/an 83 y.o. 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    AKI on CKD IV: SCr minimally worsened from prior day, with now improving BP on midodrine .  If continues to have robust BP will consider stopping midodrine  in coming days.  Anticipate renal function will plateau and improve in coming days given baseline variability in the setting of proteinuria.  Continue MIVF in the setting of N/V this AM given reduced PO intake -Chart reviewed: No new exposure to nephrotoxic agents -Continue to monitor daily Cr, Dose meds for GFR -Monitor Daily I/Os, Daily weight  -Maintain MAP>65 for optimal renal perfusion.  -Avoid nephrotoxic medications including NSAIDs -Use synthetic opioids (Fentanyl /Dilaudid ) if needed -Currently no indication for HD   Hyperkalemia Improved at 5.3 after receiving Lokelma  x2 yesterday. Acidosis improved with addition of bicarb.  Likely to resolve over time, will trend BMP.   Hypotension Resolved, now normotensive on Midodrine .    Anemia due to CKD: Hgb 7.3, stable with prior days. No signs of bleeding. -Transfuse for Hgb<7 g/dL -No role for ESA in this setting   Fall - SAH/neck pain/shoulder pain  Per Primary and NSGY   R pleural effusion S/p thoracentesis of 1.6L, consistent with exudative effusion. Appears recurrent given prior thoracentesis in 05/2023. Defer to Primary.     Jonne Netters, DO [facility], PGY-2 09/27/2023 8:39 AM  ___________________________________________________________  CC: Fall  Interval History/Subjective:  Normotensive on Midodrine  5mg  TID. Scr mildly worsened to 5.01. Only UOP documented in past 24 hours with Foley in place. Continues to be hyperkalemic to 5.3 despite Lokelma  x2 and sodium bicarb dosing.  Patient assessed at  bedside, reports some nausea and small-volume vomiting that began this morning.  Already given Zofran  by RN.  Foley in place, around 200 mL yellow urine output present.  Denies dysuria or ongoing abdominal pain.   Prior history: Presented as a level 2 trauma on 4/17 after falling in his yard, found to have small subarachnoid hemorrhage that was nonsurgical per NSGY.  Admitted for observation given generalized fatigue and confusion.  Found to have large right pleural effusion, drained 1.6L per IR.   Nephrotoxic exposures: -Had CTAP with 75 mL contrast on 4/17. -On Lasix  40 mg daily at home.  Received home medication on 4/18 and 4/19 but was subsequently discontinued. -Received vancomycin  x 2 doses and subsequently switched to Zosyn  every 8 hour. -On tramadol  50 mg every 12 hour for pain management.  Did not receive NSAIDs. -Episode of hypotension to 85/56 on 4/21, received 1L LR bolus with subsequent improvement in BP.  Continued on NS at 75 mL/h and initiated midodrine  5 mg 3 times daily.  Continues to have low normal pressures.   Initial SCr 4.1, briefly improved to SCr 3.48 with IV hydration hydration.  SCr now trended up to 4.76 with borderline oliguric UOP of 600 mL in the past 24 hours.  Developed hyperkalemia on 4/21 PM, K now 5.6. 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  UA significant for proteinuria upon admission.  Nephrology consulted.   Sees nephrology outpatient, Dr. Zelda Hickman in [place].  Last seen 08/21/2023, determined likely new baseline SCr of 3.4.  Kidney failure risk equation 2-year risk 36%, 5-year risk 75.1% based on labs from 08/14/2023. History of chronic hyperkalemia but had not been taking  Lokelma .  Denies family history of chronic kidney disease.  Previously took 2 Aleve daily for arthritic pain for "many years" but stopped a few years ago when he started seeing Dr. Zelda Hickman.   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  Episode of urinary retention requiring Foley placement for short duration.   Medications:  Current Facility-Administered Medications  Medication Dose Route Frequency Provider Last Rate Last Admin   0.9 %  sodium chloride  infusion   Intravenous Continuous Akula, Vijaya, MD 75 mL/hr at 09/27/23 0033 New Bag at 09/27/23 0033   acetaminophen  (TYLENOL ) tablet 500 mg  500 mg Oral Q6H PRN Willadean Hark, MD   500 mg at 09/25/23 1328   Or   acetaminophen  (TYLENOL ) suppository 650 mg  650 mg Rectal Q6H PRN Willadean Hark, MD       amiodarone  (PACERONE ) tablet 200 mg  200 mg Oral Daily Claiborne, Claudia, MD   200 mg at 09/26/23 1610   dexamethasone  (DECADRON ) injection 10 mg  10 mg Intravenous Q24H Akula, Vijaya, MD   10 mg at 09/26/23 1808   DULoxetine  (CYMBALTA ) DR capsule 20 mg  20 mg Oral QHS Claiborne, Claudia, MD   20 mg at 09/26/23 2329   feeding supplement (ENSURE ENLIVE / ENSURE PLUS) liquid 237 mL  237 mL Oral BID BM Akula, Vijaya, MD   237 mL at 09/26/23 1353   HYDROmorphone  (DILAUDID ) injection 0.5 mg  0.5 mg Intravenous Q4H PRN Akula, Vijaya, MD   0.5 mg at 09/26/23 1247   levothyroxine  (SYNTHROID ) tablet 100 mcg  100 mcg Oral QAC breakfast Akula, Vijaya, MD   100 mcg at 09/27/23 0703   lidocaine  (LIDODERM ) 5 % 1 patch  1 patch Transdermal Q24H Cooper, Josseline P, PA-C   1 patch at 09/26/23 1534   melatonin tablet 3 mg  3 mg Oral QHS PRN Claiborne, Claudia, MD   3 mg at 09/24/23 2140   methocarbamol  (ROBAXIN ) tablet 500 mg  500 mg Oral Q8H PRN Akula, Vijaya, MD   500 mg at 09/26/23 2330   midodrine  (PROAMATINE ) tablet 5 mg  5 mg Oral TID WC Akula, Vijaya, MD   5 mg at 09/26/23 1808   multivitamin with minerals tablet 1 tablet  1  tablet Oral Daily Akula, Vijaya, MD   1 tablet at 09/26/23 0954   ondansetron  (ZOFRAN ) tablet 4 mg  4 mg Oral Q6H PRN Willadean Hark, MD       Or   ondansetron  (ZOFRAN ) injection 4 mg  4 mg Intravenous Q6H PRN Willadean Hark, MD   4 mg at 09/24/23 1322   pantoprazole  (PROTONIX ) EC tablet 40 mg  40 mg Oral Daily Claiborne, Claudia, MD   40 mg at 09/26/23 9604   QUEtiapine  (SEROQUEL ) tablet 25 mg  25 mg Oral QHS Claiborne, Claudia, MD   25 mg at 09/26/23 2330   simvastatin  (ZOCOR ) tablet 40 mg  40 mg Oral QHS Claiborne, Claudia, MD   40 mg at 09/26/23 2329   sodium bicarbonate  tablet 1,300 mg  1,300 mg Oral BID Peeples, Samuel J, MD   1,300 mg at 09/26/23 2329   sorbitol  70 % solution 30 mL  30 mL Oral Daily PRN Willadean Hark, MD       traMADol  (  ULTRAM ) tablet 50 mg  50 mg Oral Q12H PRN Akula, Vijaya, MD   50 mg at 09/26/23 0637   Facility-Administered Medications Ordered in Other Encounters  Medication Dose Route Frequency Provider Last Rate Last Admin   epoetin  alfa-epbx (RETACRIT ) injection 10,000 Units  10,000 Units Subcutaneous Once Rao, Archana C, MD       epoetin  alfa-epbx (RETACRIT ) injection 40,000 Units  40,000 Units Subcutaneous Once Avonne Boettcher, MD          Review of Systems: 10 systems reviewed and negative except per interval history/subjective  Physical Exam: Vitals:   09/27/23 0320 09/27/23 0724  BP: 131/74 (!) 141/71  Pulse: 61 62  Resp:  20  Temp: (!) 97.4 F (36.3 C) 97.6 F (36.4 C)  SpO2: 94% 95%   No intake/output data recorded.  Intake/Output Summary (Last 24 hours) at 09/27/2023 0839 Last data filed at 09/26/2023 1800 Gross per 24 hour  Intake 220 ml  Output 250 ml  Net -30 ml   Constitutional: Sitting up in bed with blue emesis bag and small volume emesis present ENMT: ears and nose without scars or lesions, MMM CV: normal rate, no edema Respiratory: clear to auscultation, normal work of breathing Gastrointestinal: soft, mild  tenderness diffusely.  Nondistended. Skin: no visible lesions or rashes Psych: alert, judgement/insight appropriate, appropriate mood and affect   Test Results I personally reviewed new and old clinical labs and radiology tests Lab Results  Component Value Date   NA 137 09/27/2023   K 5.3 (H) 09/27/2023   CL 105 09/27/2023   CO2 19 (L) 09/27/2023   BUN 72 (H) 09/27/2023   CREATININE 5.01 (H) 09/27/2023   CALCIUM  7.4 (L) 09/27/2023   ALBUMIN  2.1 (L) 09/27/2023   PHOS 6.6 (H) 09/27/2023    CBC Recent Labs  Lab 09/23/23 0642 09/25/23 0616 09/26/23 0547 09/27/23 0555  WBC 3.8* 2.1* 2.5* 2.7*  NEUTROABS 2.4  --   --   --   HGB 9.1* 7.2* 7.4* 7.3*  HCT 29.0* 24.4* 24.5* 24.1*  MCV 90.9 93.5 93.9 93.1  PLT 222 190 203 253

## 2023-09-27 NOTE — Progress Notes (Signed)
 Inpatient Rehab Admissions Coordinator:   Attempted again to discuss CIR admit with pt. Pt. States he is nauseous and not ready to make a decision. I spoke with his wife and she states that their ultimate goal is for Pt. To come to CIR. She will be in this afternoon to talk with Pt. And is hopeful for admit tomorrow.   Wandalee Gust, MS, CCC-SLP Rehab Admissions Coordinator  [phone] (celll) [phone] (office)

## 2023-09-27 NOTE — PMR Pre-admission (Signed)
 PMR Admission Coordinator Pre-Admission Assessment  Patient: [name] is an 83 y.o., male MRN: [id number] DOB: Aug 26, 1940 Height: 5\' 11"  (180.3 cm) Weight: 72.6 kg  Insurance Information HMO:     PPO:      PCP:      IPA:      80/20: yes     OTHER:  PRIMARY: Medicare A and B      Policy#: [id number]    Subscriber:  CM Name:       Phone#:      Fax#:  Pre-Cert#: verified Health and safety inspector:  Benefits:  Phone #:      Name:  Eff. Date: a and B effective 05/06/2006    Deduct: $1632      Out of Pocket Max: n/a      Life Max: n/a CIR: 100%      SNF: 20 full days Outpatient:      Co-Pay:  Home Health: 100%      Co-Pay:  DME:      Co-Pay:  Providers:  SECONDARY: [employer] [place] Supplement      Policy#: [id number]      Phone#:   Financial Counselor:       Phone#:   The "Data Collection Information Summary" for patients in Inpatient Rehabilitation Facilities with attached "Privacy Act Statement-Health Care Records" was provided and verbally reviewed with: Pt  Emergency Contact Information Contact Information     Name Relation Home Work Mobile   [place] Spouse   [phone]      Other Contacts     Name Relation Home Work Mobile   Golinski,Abbott Son   [phone]       Current Medical History  Patient Admitting Diagnosis: SAH History of Present Illness: [name] is a 83 year old right-handed male with history of atrial fibrillation maintained on Eliquis , hypertension, hyperlipidemia, hypothyroidism, chronic anemia, cervical fusion C1-C5, CKD stage IV, BPH, CVA as well as 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  Per chart review patient lives with spouse.  Split-level home with 3 steps to entry.  Modified independent with straight point cane.  Presented 09/21/2023 after being found down in the yard.  Cranial CT scan showed focal subarachnoid hemorrhage involving a single sulcus of the left parietal lobe.  No edema mass effect or midline shift.  Left frontal  parietal scalp hematoma.  CT cervical spine negative.  CT of the chest abdomen pelvis showed no acute posttraumatic sequela in the chest abdomen or pelvis.  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  MRI of the shoulder showed widening of the acromioclavicular joint measuring 8-10 mm with disruption of the superior acromioclavicular ligament with associated joint effusion.  Supraspinatus and infraspinatus tendinosis with partial-thickness articular sided insertional tearing at the junctional fibers of the posterior supraspinatus and anterior infraspinatus tendons.  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  C5-6 moderate canal stenosis and severe bilateral foraminal stenosis as well as C6-7 severe left and moderate right foraminal stenosis with C3-4 severe right and moderate to severe left foraminal stenosis as well as C4-5.  Admission chemistries unremarkable except potassium 5.2 glucose 108 BUN 63 creatinine 3.77, hemoglobin 8.5, blood cultures Staphylococcus species.  Orthopedic service follow-up Dr. Randal Bury in regards to left [facility] joint separation advise nonweightbearing left upper extremity with shoulder sling for comfort.  Follow-up neurosurgery regards to small subarachnoid hemorrhage advised  conservative care and Eliquis  remains on hold.  Repeat cranial CT scan without contrast showed decrease subarachnoid hemorrhage.  Neurosurgery also followed up for cervical spondylosis and again advised conservative care/not a surgical candidate at this time and he was placed on Decadron  therapy.  Renal service is consulted for acute on chronic stage IV CKD with creatinine increasing to 4.76 from baseline of 3.5 and renal ultrasound completed showing no hydronephrosis and latest creatinine 5.01.  He has had bouts of hypotension requiring ProAmatine . Pt. Seen by PT/oT and they  recommended CIR to assist return to PLOF.  Complete NIHSS TOTAL: 3  Patient's medical record from [facility]  has been reviewed by the rehabilitation admission coordinator and physician.  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 No  Family History   family history includes Dementia in his sister; Diabetes in his son; Heart disease in his father.  Current Medications  Current Facility-Administered Medications:    0.9 %  sodium chloride  infusion, , Intravenous, Continuous, Nichole Barker, Vijaya, MD, Last Rate: 75 mL/hr at 09/27/23 1323, New Bag at 09/27/23 1323   acetaminophen  (TYLENOL ) tablet 500 mg, 500 mg, Oral, Q6H PRN, 500 mg at 09/25/23 1328 **OR** acetaminophen  (TYLENOL ) suppository 650 mg, 650 mg, Rectal, Q6H PRN, Claiborne, Claudia, MD   amiodarone  (PACERONE ) tablet 200 mg, 200 mg, Oral, Daily, Claiborne, Claudia, MD, 200 mg at 09/27/23 1038   dexamethasone  (DECADRON ) injection 10 mg, 10 mg, Intravenous, Q24H, Akula, Vijaya, MD, 10 mg at 09/26/23 1808   DULoxetine  (CYMBALTA ) DR capsule 20 mg, 20 mg, Oral, QHS, Claiborne, Claudia, MD, 20 mg at 09/26/23 2329   feeding supplement (ENSURE ENLIVE / ENSURE PLUS) liquid 237 mL, 237 mL, Oral, BID BM, Akula, Vijaya, MD, 237 mL at 09/26/23 1353    HYDROmorphone  (DILAUDID ) injection 0.5 mg, 0.5 mg, Intravenous, Q4H PRN, Akula, Vijaya, MD, 0.5 mg at 09/26/23 1247   levothyroxine  (SYNTHROID ) tablet 100 mcg, 100 mcg, Oral, QAC breakfast, Akula, Vijaya, MD, 100 mcg at 09/27/23 0703   lidocaine  (LIDODERM ) 5 % 1 patch, 1 patch, Transdermal, Q24H, Cooper, Josseline P, PA-C, 1 patch at 09/26/23 1534   melatonin tablet 3 mg, 3 mg, Oral, QHS PRN, Claiborne, Claudia, MD, 3 mg at 09/24/23 2140   methocarbamol  (ROBAXIN ) tablet 500 mg, 500 mg, Oral, Q8H PRN, Akula, Vijaya, MD, 500 mg at 09/26/23 2330   midodrine  (PROAMATINE ) tablet 5 mg, 5 mg, Oral, TID WC, Akula, Vijaya, MD, 5 mg at 09/26/23 1808   multivitamin with minerals tablet 1 tablet, 1 tablet, Oral, Daily, Akula, Vijaya, MD, 1 tablet at 09/26/23 0954   ondansetron  (ZOFRAN ) tablet 4 mg, 4 mg, Oral, Q6H PRN **OR** ondansetron  (ZOFRAN ) injection 4  mg, 4 mg, Intravenous, Q6H PRN, Claiborne, Claudia, MD, 4 mg at 09/27/23 9147   pantoprazole  (PROTONIX ) injection 40 mg, 40 mg, Intravenous, Q24H, Rai, Ripudeep K, MD, 40 mg at 09/27/23 1243   QUEtiapine  (SEROQUEL ) tablet 25 mg, 25 mg, Oral, QHS, Claiborne, Claudia, MD, 25 mg at 09/26/23 2330   simvastatin  (ZOCOR ) tablet 40 mg, 40 mg, Oral, QHS, Claiborne, Claudia, MD, 40 mg at 09/26/23 2329   sodium bicarbonate  tablet 1,300 mg, 1,300 mg, Oral, BID, Peeples, Samuel J, MD, 1,300 mg at 09/26/23 2329   sorbitol  70 % solution 30 mL, 30 mL, Oral, Daily PRN, Claiborne, Claudia, MD   traMADol  (ULTRAM ) tablet 50 mg, 50 mg, Oral, Q12H PRN, Akula, Vijaya, MD, 50 mg at 09/26/23 0637  Facility-Administered Medications Ordered in Other Encounters:    epoetin  alfa-epbx (RETACRIT ) injection 10,000 Units, 10,000 Units, Subcutaneous, Once, Avonne Boettcher, MD   epoetin  alfa-epbx (RETACRIT ) injection 40,000 Units, 40,000 Units, Subcutaneous, Once, Avonne Boettcher, MD  Patients Current Diet:  Diet Order             Diet clear liquid Fluid consistency: Thin  Diet effective  now                   Precautions / Restrictions Precautions Precautions: Fall Precaution/Restrictions Comments: caution with L shoulder, partial tear Restrictions Weight Bearing Restrictions Per Provider Order: No LUE Weight Bearing Per Provider Order: Non weight bearing Other Position/Activity Restrictions: Lt shoulder NWB, gentl ROM, sling for comfort. C2 ALL injury, Dr. Nichole Barker asked neurosurgery and no new restrictions/precautions added   Has the patient had 2 or more falls or a fall with injury in the past year? Yes  Prior Activity Level Community (5-7x/wk): Pt. active in the community PTA  Prior Functional Level Self Care: Did the patient need help bathing, dressing, using the toilet or eating? Independent  Indoor Mobility: Did the patient need assistance with walking from room to room (with or without device)? Independent  Stairs: Did the patient need assistance with internal or external stairs (with or without device)? Independent  Functional Cognition: Did the patient need help planning regular tasks such as shopping or remembering to take medications? Needed some help  Patient Information Are you of Hispanic, Latino/a,or Spanish origin?: A. No, not of Hispanic, Latino/a, or Spanish origin What is your race?: A. White Do you need or want an interpreter to communicate with a doctor or health care staff?: 0. No  Patient's Response To:  Health Literacy and Transportation Is the patient able to respond to health literacy and transportation needs?: Yes Health Literacy - How often do you need to have someone help you when you read instructions, pamphlets, or other written material from your doctor or pharmacy?: Never In the past 12 months, has lack of transportation kept you from medical appointments or from getting medications?: No In the past 12 months, has lack of transportation kept you from meetings, work, or from getting things needed for daily living?: No  Occupational psychologist / Equipment Home Equipment: Other (comment), Grab bars - tub/shower, Grab bars - toilet, Cane - quad  Prior Device Use: Indicate devices/aids used by the patient prior to current illness, exacerbation or injury? Walker  Current Functional Level Cognition  Arousal/Alertness: Awake/alert Overall Cognitive Status: Impaired/Different from baseline Orientation Level: Oriented X4 Attention: Sustained Sustained Attention: Impaired Sustained Attention Impairment: Verbal basic Memory: Impaired Memory Impairment: Retrieval deficit Awareness: Appears intact Problem Solving: Impaired Problem Solving Impairment: Verbal basic  Extremity Assessment (includes Sensation/Coordination)  Upper Extremity Assessment: Generalized weakness RUE Deficits / Details: hx of R shoulder injury, AROM shoulder flexion ~90 degrees 3/5MMT. LUE Deficits / Details: Partial thicknes tear. AROM of elbow/wrist/hand WFL. some initiation into shoulder mobility, but per order gentle ROM, so provided AAROM flexion/abduction LUE: Shoulder pain at rest, Shoulder pain with ROM  Lower Extremity Assessment: Generalized weakness    ADLs  Overall ADL's : Needs assistance/impaired Eating/Feeding: Bed level, Set up Grooming: Minimal assistance, Standing Grooming Details (indicate cue type and reason): at sink; min A for locating grooming items and intermittent A for balance Upper Body Bathing: Sitting, Moderate assistance Lower Body Bathing: Sitting/lateral leans, Moderate assistance Upper Body Dressing : Sitting, Maximal assistance Lower Body Dressing: Sitting/lateral leans, Maximal assistance Toilet Transfer: Minimal assistance, +2 for safety/equipment, Ambulation Toileting- Clothing Manipulation and Hygiene: Maximal assistance, Sit to/from stand Functional mobility during ADLs: Minimal assistance, +2 for safety/equipment    Mobility  Overal bed mobility: Needs Assistance Bed Mobility: Rolling, Sit to  Supine, Sidelying to Sit Rolling: Min assist Sidelying to sit: Min assist Supine to sit: Min assist Sit to supine: Min assist General bed mobility comments: cues for hand placement wtih roll Rt and mod assist to bring Lt LE off EOB and raise trunk upright. Mod-max for controlled return to supine.    Transfers  Overall transfer level: Needs assistance Equipment used: 2 person hand held assist Transfers: Sit to/from Stand Sit to Stand: Min assist, From elevated surface (slightly elevated EOB) Bed to/from chair/wheelchair/BSC transfer type:: Lateral/scoot transfer Stand pivot transfers: Min assist, +2 physical assistance, +2 safety/equipment  Lateral/Scoot Transfers: Mod assist, Max assist General transfer comment: pt declining attempts to stand due to knee pain and weakness on Lt. Pt required max cues to sequence lateral scoot to [facility], mod-max assist for lift.    Ambulation / Gait / Stairs / Wheelchair Mobility  Ambulation/Gait General Gait Details: deferred, will need +2    Posture / Balance Balance Overall balance assessment: History of Falls, Needs assistance Sitting-balance support: No upper extremity supported, Feet supported Sitting balance-Leahy Scale: Fair Standing balance support: Bilateral upper extremity supported Standing balance-Leahy Scale: Poor Standing balance comment: reliant on ext support    Special needs/care consideration Special service needs none   Previous Home Environment (from acute therapy documentation) Living Arrangements: Spouse/significant other  Lives With: Spouse Available Help at Discharge: Family, Available 24 hours/day Type of Home: House Home Layout: Multi-level Alternate Level Stairs-Rails: Right Alternate Level Stairs-Number of Steps: 7 Home Access: Ramped entrance Entrance Stairs-Rails: Right, Left, Can reach both Entrance Stairs-Number of Steps: 3 Bathroom Shower/Tub: Health visitor: Handicapped height Bathroom  Accessibility: Yes Home Care Services: No Additional Comments: *wide base QC. Pt notes a few falls this year  Discharge Living Setting Plans for Discharge Living Setting: Patient's home Type of Home at Discharge: House Discharge Home Layout: Two level, Able to live on main level with bedroom/bathroom Alternate Level Stairs-Rails: Right Alternate Level Stairs-Number of Steps: 7 Discharge Home Access: Ramped entrance Discharge Bathroom Shower/Tub: Walk-in shower Discharge Bathroom Toilet: Standard Discharge Bathroom Accessibility: Yes How Accessible: Accessible via walker, Accessible via wheelchair Does the patient have any problems obtaining your medications?: No  Social/Family/Support Systems Patient Roles: Spouse Contact Information: Kyal Arts Anticipated Caregiver: [phone] Anticipated Caregiver's Contact Information: MinA to supervision Caregiver Availability: 24/7 Discharge Plan Discussed with Primary Caregiver: Yes Is Caregiver In Agreement with Plan?: Yes Does Caregiver/Family have Issues with Lodging/Transportation while Pt is in Rehab?: No  Goals  Patient/Family Goal for Rehab: PT/OT/SLP Supervision Expected length of stay: 10-12 days Pt/Family Agrees to Admission and willing to participate: Yes Program Orientation Provided & Reviewed with Pt/Caregiver Including Roles  & Responsibilities: Yes  Decrease burden of Care through IP rehab admission: not anticipated  Possible need for SNF placement upon discharge: not anticipated  Patient Condition: I have reviewed medical records from [facility], spoken with CM, and patient. I met with patient at the bedside for inpatient rehabilitation assessment.  Patient will benefit from ongoing PT, OT, and SLP, can actively participate in 3 hours of therapy a day 5 days of the week, and can make measurable gains during the admission.  Patient will also benefit from the coordinated team approach during an Inpatient  Acute Rehabilitation admission.  The patient will receive intensive therapy as well as Rehabilitation physician, nursing, social worker, and care management interventions.  Due to safety, skin/wound care, disease management, medication administration, pain management, and patient education the patient requires 24 hour a day rehabilitation nursing.  The patient is currently min A with mobility and basic ADLs.  Discharge setting and therapy post discharge at home with home health is anticipated.  Patient has agreed to participate in the Acute Inpatient Rehabilitation Program and will admit today.  Preadmission Screen Completed By:  Dorena Gander, 09/27/2023 1:40 PM ______________________________________________________________________   Discussed status with Dr. Rayleen Cal on 09/29/23 at 900 and received approval for admission today.  Admission Coordinator:  Dorena Gander, CCC-SLP, time 1130/Date 09/29/23   Assessment/Plan: Diagnosis: traumatic SAH  Does the need for close, 24 hr/day Medical supervision in concert with the patient's rehab needs make it unreasonable for this patient to be served in a less intensive setting? Yes Co-Morbidities requiring supervision/potential complications: PAF< Pleural effusion, cervical spondylosis s/p C spine fusion, CKD IV, L shoulder rotator cuff disorder, anemia, hypothyroidism, HLD, BPH, GERD, hypotension Due to bladder management, bowel management, safety, skin/wound care, disease management, medication administration, pain management, and patient education, does the patient require 24 hr/day rehab nursing? Yes Does the patient require coordinated care of a physician, rehab nurse, PT, OT, and SLP to address physical and functional deficits in the context of the above medical diagnosis(es)? Yes Addressing deficits in the following areas: balance, endurance, locomotion, strength, transferring, bowel/bladder control, bathing, dressing, feeding, grooming, toileting,  cognition, speech, language, swallowing, and psychosocial support Can the patient actively participate in an intensive therapy program of at least 3 hrs of therapy 5 days a week? Yes The potential for patient to make measurable gains while on inpatient rehab is excellent Anticipated functional outcomes upon discharge from inpatient rehab: supervision PT, supervision OT, supervision SLP Estimated rehab length of stay to reach the above functional goals is: 10-12 days Anticipated discharge destination: Home 10. Overall Rehab/Functional Prognosis: excellent   MD Signature: Lylia Sand

## 2023-09-27 NOTE — Progress Notes (Signed)
 [facility]                                                                              [name], is a 83 y.o. male, DOB - 11-14-1940, ZOX:[id number] Admit date - 09/21/2023    Outpatient Primary MD for the patient is [name], DO  LOS - 6  days  Chief Complaint  Patient presents with   Fall   Level 2        Brief summary   Patient is a 83 y.o. male with medical history significant for atrial fibrillation on Eliquis , CKD stage IV, GERD, HTN, HLP, hypothyroidism, BPH, prior CVA, reported on admission that he was in his yard.  He remembered walking up a ramp and the next and he knows he is in the hospital.  The patient was found on the ground in his yard.  He reported that he was feeling fine prior to the fall.  His wife reported that they had actually packed and were on their way to the beach later that day. Patient is on Eliquis  he was brought in as a level 2 trauma.  CT scan of the head neck abdomen and pelvis done and the only acute abnormality was a small subarachnoid hemorrhage.  Neurosurgery was called, recommended no follow up.  Patient was also complaining of left shoulder pain. MRI of the left shoulder done and orthopedics consulted for further evaluation.    MRI of the neck ordered and repeat CT head ordered for follow up. Patient continues to have neck pain. NS was re consulted,. [name] saw the patient recommended conservative management at this time.   Assessment & Plan     Small subarachnoid  hemorrhage Neurosurgery consulted recommended to monitor for changes in neurological exam. He is alert and oriented, and  has no focal deficits.  Continue to monitor the patient off Eliquis . Repeat CT head without contrast shows decreased subarachnoid hemorrhage. Therapy evaluations have been ordered    Nausea vomiting abdominal pain -Patient noted to have nausea vomiting, abdominal pain this morning, -Will obtain stat KUB, placed on clear liquid  diet, antiemetics -Continue PPI    Neck pain:  - Left sided neck pain, left shoulder pain and with some tingling and numbness of the left hand.  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 Moderate cervical stenosis.  -Neurosurgery re consulted, seen by 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  And recommendations given. -added  IV Decadron  for pain control, robaxin  for symptomatic management.      Large right pleural effusion -Status post ultrasound thoracentesis, 1.6 lit fluid removed.  Pleural fluid protein level is 3.3  and LDH is 230 one of the light's criteria is met for exudative effusion.  Empirically started him on IV zosyn  on admission and discontinued on 4/22 as his cultures are negative.  Gram stain is  negative Cytology showed no malignant cells    Paroxysmal atrial fibrillation Rate controlled - off Eliquis  for the subarachnoid hemorrhage     History of myelofibrosis with iron deficiency anemia Follows up with oncology outpatient.       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 Pt received IV contrast on admission, he had an episode of hypotension and was on vancomycin  briefly for GPC in blood cultures. Baseline creatinine around 3.5 -Creatinine trending up, renal ultrasound negative for hydronephrosis - Currently creatinine trending up to 5.01 -Nephrology following, recommended supportive care   GPC bacteremia  Staph capitis on final report. Suspect its a contaminant.  Vancomycin  discontinued Repeat cultures ordered, negative so far.      Iron deficiency anemia   Baseline hemoglobin ranging between 7 to 9.   Hemoglobin dropped to 7.2.      Leukopenia Continue to monitor, possibly from myelofibrosis   Hypothyroidism TSH is 30, free t4 is low, patient is on synthroid  75 mcg - Synthroid  increased to 100 mcg daily   Left shoulder pain.  From the fall.  MRI of the left shoulder shows Partial-thickness tear at the origin of the acromial head of the deltoid muscle with associated grade 2 strain.  Widening of the acromioclavicular joint measuring 8-10 mm with disruption of the superior acromioclavicular ligament and associated joint effusion. These findings are compatible with [place] type II injury. - Orthopedics consulted, recommended sling and outpatient follow up.      Hypotension:  Resolved with IV fluids.      In view of his multiple medical issues, worsening renal parameters and progressive decline, palliative care consulted for GOC.       Pressure Injury Documentation: Pressure Injury 09/22/23 Face Left;Upper abrasion above left eye brow (Active)  09/22/23 1940  Location: Face  Location Orientation: Left;Upper  Staging:   Wound Description (Comments): abrasion above left eye brow  Present on Admission: Yes  Dressing Type Gauze (Comment) 09/27/23 0800    Interventions: Ensure Enlive (each supplement provides 350kcal and 20 grams of protein), MVI  Estimated body mass index is 22.32 kg/m as calculated from the following:   Height as of this encounter: 5\' 11"  (1.803 m).   Weight as of this encounter: 72.6 kg.  Code Status: Full CODE STATUS DVT Prophylaxis:  Place and maintain sequential compression device Start: 09/22/23 1724   Level of Care: Level of care: Telemetry Medical Family Communication: Updated patient' Disposition Plan:      Remains inpatient appropriate:      Procedures:  Ultrasound-guided thoracentesis  Consultants:   Palliative medicine Nephrology Neurosurgery  Antimicrobials:   Anti-infectives (From admission, onward)    Start     Dose/Rate  Route Frequency Ordered Stop   09/24/23 2239  vancomycin  variable dose per unstable renal function (pharmacist dosing)  Status:  Discontinued         Does not apply See admin instructions 09/24/23 2239 09/25/23 1153   09/24/23 1400  piperacillin -tazobactam (ZOSYN ) IVPB 2.25 g  Status:  Discontinued        2.25 g 100 mL/hr over 30 Minutes Intravenous Every 8 hours 09/24/23 1023 09/26/23 1311   09/24/23 1100  vancomycin  (VANCOREADY) IVPB 1500 mg/300 mL        1,500 mg 150 mL/hr over 120 Minutes Intravenous  Once 09/24/23 1004 09/24/23 1345   09/21/23 1630  ceFEPIme  (MAXIPIME ) 2 g in sodium chloride  0.9 % 100 mL IVPB  2 g 200 mL/hr over 30 Minutes Intravenous  Once 09/21/23 1621 09/21/23 1900   09/21/23 1630  vancomycin  (VANCOCIN ) IVPB 1000 mg/200 mL premix        1,000 mg 200 mL/hr over 60 Minutes Intravenous  Once 09/21/23 1621 09/21/23 1913   09/21/23 1630  metroNIDAZOLE  (FLAGYL ) IVPB 500 mg        500 mg 100 mL/hr over 60 Minutes Intravenous  Once 09/21/23 1621 09/21/23 1906          Medications  amiodarone   200 mg Oral Daily   dexamethasone  (DECADRON ) injection  10 mg Intravenous Q24H   DULoxetine   20 mg Oral QHS   feeding supplement  237 mL Oral BID BM   levothyroxine   100 mcg Oral QAC breakfast   lidocaine   1 patch Transdermal Q24H   midodrine   5 mg Oral TID WC   multivitamin with minerals  1 tablet Oral Daily   pantoprazole   40 mg Oral Daily   QUEtiapine   25 mg Oral QHS   simvastatin   40 mg Oral QHS   sodium bicarbonate   1,300 mg Oral BID      Subjective:   [name] was seen and examined today.  Complaining of nausea and vomiting, feeling miserable today.  Very weak and tired.  No fevers. Objective:   Vitals:   09/26/23 2358 09/27/23 0320 09/27/23 0724 09/27/23 1103  BP: 122/62 131/74 (!) 141/71 137/71  Pulse: 62 61 62 85  Resp:   20 20  Temp: (!) 97.5 F (36.4 C) (!) 97.4 F (36.3 C) 97.6 F (36.4 C) 97.8 F (36.6 C)  TempSrc: Oral Oral Oral  Oral  SpO2: 95% 94% 95% 94%  Weight:      Height:        Intake/Output Summary (Last 24 hours) at 09/27/2023 1145 Last data filed at 09/27/2023 0900 Gross per 24 hour  Intake 438 ml  Output 250 ml  Net 188 ml     Wt Readings from Last 3 Encounters:  09/21/23 72.6 kg  08/30/23 76.3 kg  08/28/23 75.5 kg     Exam General: Alert and oriented x 3, NAD, ill-appearing Cardiovascular: S1 S2 auscultated,  RRR Respiratory: Diminished breath sound at the bases Gastrointestinal: Soft, mild diffuse TTP, NBS  Ext: no pedal edema bilaterally, left shoulder TTP, left-sided neck tenderness Neuro: Strength 5/5 upper and lower extremities bilaterally Psych: anxious    Data Reviewed:  I have personally reviewed following labs    CBC Lab Results  Component Value Date   WBC 2.7 (L) 09/27/2023   RBC 2.59 (L) 09/27/2023   HGB 7.3 (L) 09/27/2023   HCT 24.1 (L) 09/27/2023   MCV 93.1 09/27/2023   MCH 28.2 09/27/2023   PLT 253 09/27/2023   MCHC 30.3 09/27/2023   RDW 18.9 (H) 09/27/2023   LYMPHSABS 0.5 (L) 09/23/2023   MONOABS 0.7 09/23/2023   EOSABS 0.0 09/23/2023   BASOSABS 0.0 09/23/2023     Last metabolic panel Lab Results  Component Value Date   NA 137 09/27/2023   K 5.3 (H) 09/27/2023   CL 105 09/27/2023   CO2 19 (L) 09/27/2023   BUN 72 (H) 09/27/2023   CREATININE 5.01 (H) 09/27/2023   GLUCOSE 130 (H) 09/27/2023   GFRNONAA 11 (L) 09/27/2023   GFRAA 55 (L) 05/25/2020   CALCIUM  7.4 (L) 09/27/2023   PHOS 6.6 (H) 09/27/2023   PROT 5.6 (L) 09/22/2023   ALBUMIN  2.1 (L) 09/27/2023   LABGLOB 1.5 09/13/2022  AGRATIO 2.6 (H) 09/13/2022   BILITOT 0.7 09/22/2023   ALKPHOS 103 09/22/2023   AST 17 09/22/2023   ALT 18 09/22/2023   ANIONGAP 13 09/27/2023    CBG (last 3)  No results for input(s): "GLUCAP" in the last 72 hours.    Coagulation Profile: Recent Labs  Lab 09/21/23 1432  INR 1.4*     Radiology Studies: I have personally reviewed the imaging studies  US   RENAL Result Date: 09/26/2023 CLINICAL DATA:  [id number] AKI (acute kidney injury) (HCC) [id number] EXAM: RENAL / URINARY TRACT ULTRASOUND COMPLETE COMPARISON:  September 21, 2023 FINDINGS: Right Kidney: Renal measurements: 11.6 x 5.9 x 6.3 cm = volume: 224 mL. Multiple cysts, the largest measuring 7.3 x 6.3 x 7.1 cm. Increased cortical echogenicity. No hydronephrosis or nephrolithiasis. Left Kidney: Renal measurements: 12.4 x 5.1 x 5.4 cm = volume: 149 mL. Increased echogenicity No hydronephrosis or nephrolithiasis. Multiple cysts, the largest measures 5.5 x 5.6 x 6.4 cm. Bladder: Appears normal for degree of bladder distention. Other: Lobular hypoechogenicity in the gallbladder measuring 2.9 x 1.4 x 2.9 cm. 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 IMPRESSION: No hydronephrosis or nephrolithiasis. 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 Electronically Signed   By: Rance Burrows M.D.   On: 09/26/2023 08:20       Ceci Taliaferro M.D. [facility] 09/27/2023, 11:45 AM  Available via Epic secure chat 7am-7pm After 7 pm, please refer to night coverage provider listed on amion.

## 2023-09-28 ENCOUNTER — Inpatient Hospital Stay

## 2023-09-28 DIAGNOSIS — J90 Pleural effusion, not elsewhere classified: Secondary | ICD-10-CM | POA: Diagnosis not present

## 2023-09-28 DIAGNOSIS — T68XXXA Hypothermia, initial encounter: Secondary | ICD-10-CM | POA: Diagnosis not present

## 2023-09-28 DIAGNOSIS — S0990XA Unspecified injury of head, initial encounter: Secondary | ICD-10-CM | POA: Diagnosis not present

## 2023-09-28 DIAGNOSIS — W19XXXA Unspecified fall, initial encounter: Secondary | ICD-10-CM | POA: Diagnosis not present

## 2023-09-28 LAB — RENAL FUNCTION PANEL
Albumin: 2.1 g/dL — ABNORMAL LOW (ref 3.5–5.0)
Anion gap: 12 (ref 5–15)
BUN: 76 mg/dL — ABNORMAL HIGH (ref 8–23)
CO2: 17 mmol/L — ABNORMAL LOW (ref 22–32)
Calcium: 7.3 mg/dL — ABNORMAL LOW (ref 8.9–10.3)
Chloride: 111 mmol/L (ref 98–111)
Creatinine, Ser: 4.98 mg/dL — ABNORMAL HIGH (ref 0.61–1.24)
GFR, Estimated: 11 mL/min — ABNORMAL LOW (ref >60)
Glucose, Bld: 125 mg/dL — ABNORMAL HIGH (ref 70–99)
Phosphorus: 6.3 mg/dL — ABNORMAL HIGH (ref 2.5–4.6)
Potassium: 4.8 mmol/L (ref 3.5–5.1)
Sodium: 140 mmol/L (ref 135–145)

## 2023-09-28 LAB — CBC
HCT: 26 % — ABNORMAL LOW (ref 39.0–52.0)
Hemoglobin: 7.8 g/dL — ABNORMAL LOW (ref 13.0–17.0)
MCH: 27.9 pg (ref 26.0–34.0)
MCHC: 30 g/dL (ref 30.0–36.0)
MCV: 92.9 fL (ref 80.0–100.0)
Platelets: 297 10*3/uL (ref 150–400)
RBC: 2.8 MIL/uL — ABNORMAL LOW (ref 4.22–5.81)
RDW: 19.1 % — ABNORMAL HIGH (ref 11.5–15.5)
WBC: 1.6 10*3/uL — ABNORMAL LOW (ref 4.0–10.5)
nRBC: 0 % (ref 0.0–0.2)

## 2023-09-28 MED ORDER — SENNOSIDES-DOCUSATE SODIUM 8.6-50 MG PO TABS
1.0000 | ORAL_TABLET | Freq: Two times a day (BID) | ORAL | Status: DC
  Filled 2023-09-28: qty 1

## 2023-09-28 MED ORDER — POLYETHYLENE GLYCOL 3350 17 G PO PACK: 17.0000 g | PACK | Freq: Every day | ORAL | Status: DC

## 2023-09-28 MED ORDER — BISACODYL 10 MG RE SUPP: 10.0000 mg | Freq: Once | RECTAL | Status: DC

## 2023-09-28 MED ORDER — LACTULOSE 10 GM/15ML PO SOLN: 20.0000 g | Freq: Once | ORAL | Status: DC

## 2023-09-28 NOTE — Progress Notes (Signed)
 IP rehab admissions - I met with patient.  He is feeling better and nausea is much better.  He tells me that nephrology wants to follow him on acute for one more day.  I messaged Dr. Thelma Fire and we will plan for admit to CIR tomorrow if patient is medically stable in am.  [phone]

## 2023-09-28 NOTE — Progress Notes (Signed)
 Speech Language Pathology Treatment: Cognitive-Linquistic  Patient Details Name: [name] MRN: [id number] DOB: [birth year] Today's Date: 09/28/2023 Time: 7829-5621 SLP Time Calculation (min) (ACUTE ONLY): 12 min  Assessment / Plan / Recommendation Clinical Impression  Attempted to complete a pillbox activity in which pt was asked to identify errors, although pt perseverated on the fact that this was not applicable to him as his wife manages his medications. This SLP and pt's wife provided education regarding the use of similar activities to target cognition. He participated but question effort overall as he required Max multimodal cueing to attend to task and use problem solving to reference the key to check for accuracy. Pt states that he does feel that his performance is different than baseline. SLP plans to f/u with more applicable activities to target memory, attention, and problem solving.    HPI HPI: [name] is an 83 yo male presenting to ED 4/17 after falling in his yard. CTH shows small L parietal SAH. CT Chest/Abdomen/Pelvis revealed a large R pleural effusion with increased compressive atelectasis of the R middle and R lower lobe. PMH includes GERD, HLD, HTN, melanoma, SCC, prior CVA (R subcortical infarct) 2009      SLP Plan  Continue with current plan of care      Recommendations for follow up therapy are one component of a multi-disciplinary discharge planning process, led by the attending physician.  Recommendations may be updated based on patient status, additional functional criteria and insurance authorization.    Recommendations                     Oral care BID   Frequent or constant Supervision/Assistance Cognitive communication deficit (H08.657)     Continue with current plan of care     Amil Kale, M.A., CCC-SLP Speech Language Pathology, Acute Rehabilitation Services  Secure Chat preferred [phone]   09/28/2023, 4:39 PM

## 2023-09-28 NOTE — Progress Notes (Signed)
 Physical Therapy Treatment Patient Details Name: [name] MRN: [id number] DOB: May 01, 1941 Today's Date: 09/28/2023   History of Present Illness 83 yo M who presented [facility] ED 09/21/23 after falling at home. CT shows small acute subarachnoid hemorrhage, MRI of L shoulder noted Supraspinatus and infraspinatus tendinosis with partial-thickness tear. S/p R thoracentesis on 09/22/23. PMH Covid, HLD, HTN, hypothyroidism, myelofibrosis, skin CA, CVA, back surgery    PT Comments  Pt is progressing towards goals. Min A for bed mobility, Min A +2 for sit to stand at [facility] and for step pivot transfer. Pt deferred gait at this time. Pt denies pain throughout. Due to pt current functional status, home set up and available assistance at home recommending skilled physical therapy services < 3 hours/day in order to address strength, balance and functional mobility to decrease risk for falls, injury, immobility, skin break down and re-hospitalization.      If plan is discharge home, recommend the following: Assist for transportation;Supervision due to cognitive status;Help with stairs or ramp for entrance;Assistance with cooking/housework;A little help with walking and/or transfers   Can travel by private vehicle     No  Equipment Recommendations  None recommended by PT       Precautions / Restrictions Precautions Precautions: Fall Recall of Precautions/Restrictions: Intact Precaution/Restrictions Comments: caution with L shoulder, partial tear Required Braces or Orthoses: Sling Restrictions Weight Bearing Restrictions Per Provider Order: No LUE Weight Bearing Per Provider Order: Non weight bearing Other Position/Activity Restrictions: Lt shoulder NWB, gentl ROM, sling for comfort. C2 ALL injury, Dr. Nichole Barker asked neurosurgery and no new restrictions/precautions added     Mobility  Bed Mobility Overal bed mobility: Needs Assistance Bed Mobility: Supine to Sit     Supine to sit: Min assist,  Contact guard, +2 for safety/equipment     General bed mobility comments: Min A for trunk to mid line with pt pulling up with PT hand and second person near by CGA at trunk    Transfers Overall transfer level: Needs assistance Equipment used: 2 person hand held assist Transfers: Sit to/from Stand Sit to Stand: Min assist, +2 physical assistance   Step pivot transfers: Min assist, +2 safety/equipment       General transfer comment: Min A initially in standing for balance. Two person Min A for sit to stand for initial momentum to get to standing. Pt was able to take steps from EOB to recliner. Short steps with low foot clearance.    Ambulation/Gait   Pre-gait activities: Marching in place with 2 person Min A HHA then Min A for steps from EOB to recliner with partial step through to short step through with low foot clearance.     Modified Rankin (Stroke Patients Only) Modified Rankin (Stroke Patients Only) Pre-Morbid Rankin Score: No symptoms Modified Rankin: Moderate disability     Balance Overall balance assessment: History of Falls, Needs assistance Sitting-balance support: No upper extremity supported, Feet supported Sitting balance-Leahy Scale: Fair     Standing balance support: Single extremity supported Standing balance-Leahy Scale: Poor Standing balance comment: reliant on ext support        Communication Communication Communication: Impaired Factors Affecting Communication: Hearing impaired  Cognition Arousal: Alert Behavior During Therapy: WFL for tasks assessed/performed   PT - Cognitive impairments: Safety/Judgement     Following commands: Intact      Cueing Cueing Techniques: Verbal cues, Gestural cues, Tactile cues     General Comments General comments (skin integrity, edema, etc.): No signs/symptoms of cardiac/respiratory  distress during session      Pertinent Vitals/Pain Pain Assessment Pain Assessment: No/denies pain     PT Goals  (current goals can now be found in the care plan section) Acute Rehab PT Goals Patient Stated Goal: not stated PT Goal Formulation: With patient Time For Goal Achievement: 10/06/23 Potential to Achieve Goals: Fair Progress towards PT goals: Progressing toward goals    Frequency    Min 1X/week      PT Plan  Continue with current POC        AM-PAC PT "6 Clicks" Mobility   Outcome Measure  Help needed turning from your back to your side while in a flat bed without using bedrails?: A Little Help needed moving from lying on your back to sitting on the side of a flat bed without using bedrails?: A Little Help needed moving to and from a bed to a chair (including a wheelchair)?: A Lot Help needed standing up from a chair using your arms (e.g., wheelchair or bedside chair)?: A Lot Help needed to walk in hospital room?: Total Help needed climbing 3-5 steps with a railing? : Total 6 Click Score: 12    End of Session Equipment Utilized During Treatment: Gait belt Activity Tolerance: Patient tolerated treatment well Patient left: with call bell/phone within reach;in chair;with chair alarm set Nurse Communication: Mobility status PT Visit Diagnosis: Unsteadiness on feet (R26.81);Muscle weakness (generalized) (M62.81);History of falling (Z91.81);Difficulty in walking, not elsewhere classified (R26.2)     Time: 1610-9604 PT Time Calculation (min) (ACUTE ONLY): 27 min  Charges:    $Therapeutic Activity: 23-37 mins PT General Charges $$ ACUTE PT VISIT: 1 Visit                     Sloan Duncans, DPT, CLT  Acute Rehabilitation Services Office: [phone] (Secure chat preferred)    Jenice Mitts 09/28/2023, 4:09 PM

## 2023-09-28 NOTE — Plan of Care (Signed)
 Was in good spirits and without nausea or vomiting today.  Sat up in the chair for hours with family at side.  No signs of distress.   Problem: Education: Goal: Knowledge of patient specific risk factors will improve (DELETE if not current risk factor) Outcome: Progressing   Problem: Ischemic Stroke/TIA Tissue Perfusion: Goal: Complications of ischemic stroke/TIA will be minimized Outcome: Progressing   Problem: Coping: Goal: Will identify appropriate support needs Outcome: Progressing   Problem: Education: Goal: Knowledge of secondary prevention will improve (MUST DOCUMENT ALL) Outcome: Progressing   Problem: Safety: Goal: Ability to remain free from injury will improve Outcome: Progressing   Problem: Education: Goal: Knowledge of General Education information will improve Description: Including pain rating scale, medication(s)/side effects and non-pharmacologic comfort measures Outcome: Progressing

## 2023-09-28 NOTE — Progress Notes (Addendum)
 Nephrology Follow-Up Consult note   Assessment/Recommendations: [name] is a/an 83 y.o. 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    AKI on CKD IV: Baseline creatinine around 3-4 and seems to fluctuate.  Followed by Dr. Zelda Hickman ([facility]) in the outpatient setting and starting to initiate plans to prepare for dialysis.  Likely AKI related to dehydration and some degree of ATN from hypotension and other potential toxic exposures including contrast and vancomycin .  Creatinine has now plateaued.  Likely will improve with time.  -Can continue with IVFs for now -If creatinine is stable or improving tomorrow we will sign off -Continue to monitor daily Cr, Dose meds for GFR -Monitor Daily I/Os, Daily weight  -Maintain MAP>65 for optimal renal perfusion.  -Avoid nephrotoxic medications including NSAIDs -Use synthetic opioids (Fentanyl /Dilaudid ) if needed -Currently no indication for HD   Hyperkalemia Now improved.  Continue to monitor   Hypotension Resolved, now normotensive on Midodrine .  Would start weaning this medication if blood pressures consistently greater than 130 and/or his kidney function starts to improve   Anemia due to CKD: Hgb 7.8, stable with prior days.  Transfusions per primary team   Fall - SAH/neck pain/shoulder pain  Per Primary and NSGY   R pleural effusion S/p thoracentesis of 1.6L, consistent with exudative effusion. Appears recurrent given prior thoracentesis in 05/2023. Defer to Primary.   Leukopenia: Defer to primary  Metabolic acidosis: Continue sodium bicarbonate  as ordered ___________________________________________________________  CC: Fall  Interval History/Subjective:  Patient states he feels better today.  Appetite is still slightly poor but nausea and vomiting much improved.  Thinks he may have been constipated.  Was able to have a bowel  movement yesterday.   Medications:  Current Facility-Administered Medications  Medication Dose Route Frequency Provider Last Rate Last Admin   0.9 %  sodium chloride  infusion   Intravenous Continuous Akula, Vijaya, MD 75 mL/hr at 09/28/23 0343 New Bag at 09/28/23 0343   acetaminophen  (TYLENOL ) tablet 500 mg  500 mg Oral Q6H PRN Willadean Hark, MD   500 mg at 09/25/23 1328   Or   acetaminophen  (TYLENOL ) suppository 650 mg  650 mg Rectal Q6H PRN Willadean Hark, MD       amiodarone  (PACERONE ) tablet 200 mg  200 mg Oral Daily Claiborne, Claudia, MD   200 mg at 09/27/23 1038   bisacodyl  (DULCOLAX) suppository 10 mg  10 mg Rectal Once Rai, Ripudeep K, MD       dexamethasone  (DECADRON ) injection 10 mg  10 mg Intravenous Q24H Akula, Vijaya, MD   10 mg at 09/27/23 1810   DULoxetine  (CYMBALTA ) DR capsule 20 mg  20 mg Oral QHS Claiborne, Claudia, MD   20 mg at 09/27/23 2127   feeding supplement (ENSURE ENLIVE / ENSURE PLUS) liquid 237 mL  237 mL Oral BID BM Akula, Vijaya, MD   237 mL at 09/26/23 1353   HYDROmorphone  (DILAUDID ) injection 0.5 mg  0.5 mg Intravenous Q4H PRN Akula, Vijaya, MD   0.5 mg at 09/26/23 1247   lactulose  (CHRONULAC ) 10 GM/15ML solution 20 g  20 g Oral Once Rai, Ripudeep K, MD       levothyroxine  (SYNTHROID ) tablet 100 mcg  100 mcg Oral QAC breakfast Akula, Vijaya, MD   100 mcg at 09/28/23 0601   lidocaine  (LIDODERM ) 5 % 1 patch  1 patch Transdermal Q24H Cooper, Josseline P, PA-C   1 patch at 09/27/23  1502   melatonin tablet 3 mg  3 mg Oral QHS PRN Claiborne, Claudia, MD   3 mg at 09/24/23 2140   methocarbamol  (ROBAXIN ) tablet 500 mg  500 mg Oral Q8H PRN Akula, Vijaya, MD   500 mg at 09/26/23 2330   metoCLOPramide  (REGLAN ) injection 5 mg  5 mg Intravenous Q6H Rai, Ripudeep K, MD   5 mg at 09/28/23 0601   midodrine  (PROAMATINE ) tablet 5 mg  5 mg Oral TID WC Akula, Vijaya, MD   5 mg at 09/26/23 1808   multivitamin with minerals tablet 1 tablet  1 tablet Oral Daily Akula,  Vijaya, MD   1 tablet at 09/26/23 0981   ondansetron  (ZOFRAN ) tablet 4 mg  4 mg Oral Q6H PRN Willadean Hark, MD       Or   ondansetron  (ZOFRAN ) injection 4 mg  4 mg Intravenous Q6H PRN Willadean Hark, MD   4 mg at 09/27/23 1614   pantoprazole  (PROTONIX ) injection 40 mg  40 mg Intravenous Q24H Rai, Ripudeep K, MD   40 mg at 09/27/23 1243   [START ON 09/29/2023] polyethylene glycol (MIRALAX  / GLYCOLAX ) packet 17 g  17 g Oral Daily Rai, Ripudeep K, MD       QUEtiapine  (SEROQUEL ) tablet 25 mg  25 mg Oral QHS Claiborne, Claudia, MD   25 mg at 09/27/23 2127   senna-docusate (Senokot-S) tablet 1 tablet  1 tablet Oral BID Rai, Ripudeep K, MD       simvastatin  (ZOCOR ) tablet 40 mg  40 mg Oral QHS Claiborne, Claudia, MD   40 mg at 09/27/23 2127   sodium bicarbonate  tablet 1,300 mg  1,300 mg Oral BID Ethyl Vila J, MD   1,300 mg at 09/27/23 2127   sorbitol  70 % solution 30 mL  30 mL Oral Daily PRN Claiborne, Claudia, MD       traMADol  (ULTRAM ) tablet 50 mg  50 mg Oral Q12H PRN Akula, Vijaya, MD   50 mg at 09/26/23 1914   Facility-Administered Medications Ordered in Other Encounters  Medication Dose Route Frequency Provider Last Rate Last Admin   epoetin  alfa-epbx (RETACRIT ) injection 10,000 Units  10,000 Units Subcutaneous Once Rao, Archana C, MD       epoetin  alfa-epbx (RETACRIT ) injection 40,000 Units  40,000 Units Subcutaneous Once Avonne Boettcher, MD          Review of Systems: 10 systems reviewed and negative except per interval history/subjective  Physical Exam: Vitals:   09/28/23 0356 09/28/23 0728  BP: 126/74 127/78  Pulse: 92 (!) 106  Resp: 18 18  Temp: 97.8 F (36.6 C) 98.2 F (36.8 C)  SpO2: 93% 96%   No intake/output data recorded.  Intake/Output Summary (Last 24 hours) at 09/28/2023 7829 Last data filed at 09/27/2023 1500 Gross per 24 hour  Intake --  Output 350 ml  Net -350 ml   Constitutional: Lying in bed, no distress ENMT: ears and nose without scars or  lesions, MMM CV: normal rate, no edema Respiratory: Bilateral chest rise, normal work of breathing Gastrointestinal: soft, nondistended Skin: no visible lesions or rashes Psych: alert, judgement/insight appropriate, appropriate mood and affect   Test Results I personally reviewed new and old clinical labs and radiology tests Lab Results  Component Value Date   NA 140 09/28/2023   K 4.8 09/28/2023   CL 111 09/28/2023   CO2 17 (L) 09/28/2023   BUN 76 (H) 09/28/2023   CREATININE 4.98 (H) 09/28/2023   CALCIUM  7.3 (L) 09/28/2023  ALBUMIN  2.1 (L) 09/28/2023   PHOS 6.3 (H) 09/28/2023    CBC Recent Labs  Lab 09/23/23 0642 09/25/23 0616 09/26/23 0547 09/27/23 0555 09/28/23 0512  WBC 3.8*   < > 2.5* 2.7* 1.6*  NEUTROABS 2.4  --   --   --   --   HGB 9.1*   < > 7.4* 7.3* 7.8*  HCT 29.0*   < > 24.5* 24.1* 26.0*  MCV 90.9   < > 93.9 93.1 92.9  PLT 222   < > 203 253 297   < > = values in this interval not displayed.

## 2023-09-28 NOTE — Progress Notes (Signed)
 [facility]                                                                              [name], is a 83 y.o. male, DOB - 05/28/1941, ZOX:[id number] Admit date - 09/21/2023    Outpatient Primary MD for the patient is [name], DO  LOS - 7  days  Chief Complaint  Patient presents with   Fall   Level 2        Brief summary   Patient is a 83 y.o. male with medical history significant for atrial fibrillation on Eliquis , CKD stage IV, GERD, HTN, HLP, hypothyroidism, BPH, prior CVA, reported on admission that he was in his yard.  He remembered walking up a ramp and the next and he knows he is in the hospital.  The patient was found on the ground in his yard.  He reported that he was feeling fine prior to the fall.  His wife reported that they had actually packed and were on their way to the beach later that day. Patient is on Eliquis  he was brought in as a level 2 trauma.  CT scan of the head neck abdomen and pelvis done and the only acute abnormality was a small subarachnoid hemorrhage.  Neurosurgery was called, recommended no follow up.  Patient was also complaining of left shoulder pain. MRI of the left shoulder done and orthopedics consulted for further evaluation.    MRI of the neck ordered and repeat CT head ordered for follow up. Patient continues to have neck pain. NS was re consulted,. [name] saw the patient recommended conservative management at this time.   Assessment & Plan     Small subarachnoid  hemorrhage Neurosurgery consulted recommended to monitor for changes in neurological exam. He is alert and oriented, and  has no focal deficits.  Continue to monitor the patient off Eliquis . Repeat CT head without contrast shows decreased subarachnoid hemorrhage. PT evaluation recommending CIR   Nausea vomiting abdominal pain: Due to ileus and constipation - KUB 4/23 showed ileus with constipation -Patient was placed on bowel regimen, clear  liquid diet -Had 2 BMs today and feeling good, no further nausea vomiting or abdominal pain - diet advanced to full liquids and will advance to soft solids tonight    Neck pain:  - Left sided neck pain, left shoulder pain and with some tingling and numbness of the left hand.  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 Moderate cervical stenosis.  -Neurosurgery re consulted, seen by 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  And recommendations given. -added IV Decadron  for pain control, robaxin  for symptomatic management.      Large right pleural effusion -Status post ultrasound thoracentesis, 1.6 lit fluid removed.  Pleural fluid protein level is 3.3  and LDH is 230 one of the light's criteria is met for exudative  effusion.  Empirically started him on IV zosyn  on admission and discontinued on 4/22 as his cultures are negative.  Gram stain is negative Cytology showed no malignant cells    Paroxysmal atrial fibrillation - Rate controlled - off Eliquis  for the subarachnoid hemorrhage     History of myelofibrosis with iron deficiency anemia -Outpatient follow-up with oncology  Acute on Stage IV CKD Worsening of renal parameters over the last 72 hours. Pt received IV contrast on admission, he had an episode of hypotension and was on vancomycin  briefly for GPC in blood cultures. Baseline creatinine around 3.5 -Creatinine trending up, renal ultrasound negative for hydronephrosis - Creatinine plateaued at 5.01, now improving, 4.9 today -Nephrology following, recommended supportive care   GPC bacteremia-contaminant  Staph capitis on final report. Suspect its a contaminant.   Vancomycin  discontinued Repeat cultures ordered, negative so far.      Iron deficiency anemia   Baseline hemoglobin ranging between 7 to 9.  H&H improving, 7.8    Leukopenia Continue to monitor, possibly from myelofibrosis -May resume Revlimid    Hypothyroidism TSH is 30, free t4 is low, patient is on synthroid  75 mcg - Synthroid  increased to 100 mcg daily   Left shoulder pain.  From the fall.  MRI of the left shoulder shows Partial-thickness tear at the origin of the acromial head of the deltoid muscle with associated grade 2 strain.  Widening of the acromioclavicular joint measuring 8-10 mm with disruption of the superior acromioclavicular ligament and associated joint effusion. These findings are compatible with [place] type II injury. - Orthopedics consulted, recommended sling and outpatient follow up.      Hypotension:  Resolved with IV fluids.      In view of his multiple medical issues, worsening renal parameters and progressive decline, palliative care consulted for GOC.       Pressure Injury Documentation: Pressure Injury 09/22/23 Face Left;Upper abrasion above left eye brow (Active)  09/22/23 1940  Location: Face  Location Orientation: Left;Upper  Staging:   Wound Description (Comments): abrasion above left eye brow  Present on Admission: Yes  Dressing Type None 09/28/23 0800    Interventions: Ensure Enlive (each supplement provides 350kcal and 20 grams of protein), MVI  Estimated body mass index is 22.32 kg/m as calculated from the following:   Height as of this encounter: 5\' 11"  (1.803 m).   Weight as of this encounter: 72.6 kg.  Code Status: Full CODE STATUS DVT Prophylaxis:  Place and maintain sequential compression device Start: 09/22/23 1724   Level of Care: Level of care: Telemetry Medical Family Communication: Updated patient' Disposition Plan:      Remains inpatient appropriate:      Procedures:  Ultrasound-guided  thoracentesis  Consultants:   Palliative medicine Nephrology Neurosurgery  Antimicrobials:   Anti-infectives (From admission, onward)    Start     Dose/Rate Route Frequency Ordered Stop   09/24/23 2239  vancomycin  variable dose per unstable renal function (pharmacist dosing)  Status:  Discontinued         Does not apply See admin instructions 09/24/23 2239 09/25/23 1153   09/24/23 1400  piperacillin -tazobactam (ZOSYN ) IVPB 2.25 g  Status:  Discontinued        2.25 g 100 mL/hr over 30 Minutes Intravenous Every 8 hours 09/24/23 1023 09/26/23 1311   09/24/23 1100  vancomycin  (VANCOREADY) IVPB 1500 mg/300 mL        1,500 mg 150 mL/hr over 120 Minutes Intravenous  Once 09/24/23 1004 09/24/23 1345   09/21/23  1630  ceFEPIme  (MAXIPIME ) 2 g in sodium chloride  0.9 % 100 mL IVPB        2 g 200 mL/hr over 30 Minutes Intravenous  Once 09/21/23 1621 09/21/23 1900   09/21/23 1630  vancomycin  (VANCOCIN ) IVPB 1000 mg/200 mL premix        1,000 mg 200 mL/hr over 60 Minutes Intravenous  Once 09/21/23 1621 09/21/23 1913   09/21/23 1630  metroNIDAZOLE  (FLAGYL ) IVPB 500 mg        500 mg 100 mL/hr over 60 Minutes Intravenous  Once 09/21/23 1621 09/21/23 1906          Medications  amiodarone   200 mg Oral Daily   bisacodyl   10 mg Rectal Once   dexamethasone  (DECADRON ) injection  10 mg Intravenous Q24H   DULoxetine   20 mg Oral QHS   feeding supplement  237 mL Oral BID BM   lactulose   20 g Oral Once   levothyroxine   100 mcg Oral QAC breakfast   lidocaine   1 patch Transdermal Q24H   metoCLOPramide  (REGLAN ) injection  5 mg Intravenous Q6H   midodrine   5 mg Oral TID WC   multivitamin with minerals  1 tablet Oral Daily   pantoprazole  (PROTONIX ) IV  40 mg Intravenous Q24H   [START ON 09/29/2023] polyethylene glycol  17 g Oral Daily   QUEtiapine   25 mg Oral QHS   senna-docusate  1 tablet Oral BID   simvastatin   40 mg Oral QHS   sodium bicarbonate   1,300 mg Oral BID      Subjective:   [name] was seen and examined today.  No further nausea vomiting or abdominal pain.  States had 2 BMs today and feels good.  Wants to advance diet.  No fever chills, chest pain or shortness of breath.    Objective:   Vitals:   09/28/23 0005 09/28/23 0356 09/28/23 0728 09/28/23 1156  BP: 118/76 126/74 127/78 108/71  Pulse:  92 (!) 106 80  Resp: 16 18 18 16   Temp: 97.9 F (36.6 C) 97.8 F (36.6 C) 98.2 F (36.8 C) 97.6 F (36.4 C)  TempSrc: Oral Oral Oral Oral  SpO2: 93% 93% 96% 96%  Weight:      Height:        Intake/Output Summary (Last 24 hours) at 09/28/2023 1352 Last data filed at 09/27/2023 1500 Gross per 24 hour  Intake --  Output 350 ml  Net -350 ml     Wt Readings from Last 3 Encounters:  09/21/23 72.6 kg  08/30/23 76.3 kg  08/28/23 75.5 kg   Physical Exam General: Alert and oriented x 3, NAD Cardiovascular: S1 S2 clear, RRR.  Respiratory: CTAB, no wheezing Gastrointestinal: Soft, nontender, nondistended, NBS Ext: no pedal edema bilaterally Neuro: no new deficits Psych: Normal affect   Data Reviewed:  I have personally reviewed following labs    CBC Lab Results  Component Value Date   WBC 1.6 (L) 09/28/2023   RBC 2.80 (L) 09/28/2023   HGB 7.8 (L) 09/28/2023   HCT 26.0 (L) 09/28/2023   MCV 92.9 09/28/2023   MCH 27.9 09/28/2023   PLT 297 09/28/2023   MCHC 30.0 09/28/2023   RDW 19.1 (H) 09/28/2023   LYMPHSABS 0.5 (L) 09/23/2023   MONOABS 0.7 09/23/2023   EOSABS 0.0 09/23/2023   BASOSABS 0.0 09/23/2023     Last metabolic panel Lab Results  Component Value Date   NA 140 09/28/2023   K 4.8 09/28/2023   CL 111 09/28/2023  CO2 17 (L) 09/28/2023   BUN 76 (H) 09/28/2023   CREATININE 4.98 (H) 09/28/2023   GLUCOSE 125 (H) 09/28/2023   GFRNONAA 11 (L) 09/28/2023   GFRAA 55 (L) 05/25/2020   CALCIUM  7.3 (L) 09/28/2023   PHOS 6.3 (H) 09/28/2023   PROT 5.6 (L) 09/22/2023   ALBUMIN  2.1 (L) 09/28/2023   LABGLOB 1.5 09/13/2022   AGRATIO 2.6 (H)  09/13/2022   BILITOT 0.7 09/22/2023   ALKPHOS 103 09/22/2023   AST 17 09/22/2023   ALT 18 09/22/2023   ANIONGAP 12 09/28/2023    CBG (last 3)  No results for input(s): "GLUCAP" in the last 72 hours.    Coagulation Profile: Recent Labs  Lab 09/21/23 1432  INR 1.4*     Radiology Studies: I have personally reviewed the imaging studies  DG Abd Portable 1V Result Date: 09/27/2023 CLINICAL DATA:  Nausea, vomiting. EXAM: PORTABLE ABDOMEN - 1 VIEW COMPARISON:  July 02, 2009. FINDINGS: Moderate gastric distention is noted. No significant dilatation of large or small bowel is noted. Mild amount of stool seen throughout the colon. IMPRESSION: Mild stool burden. Moderate gastric distention is noted of uncertain etiology. Electronically Signed   By: Rosalene Colon M.D.   On: 09/27/2023 12:14       Britta Louth M.D. [facility] 09/28/2023, 1:52 PM  Available via Epic secure chat 7am-7pm After 7 pm, please refer to night coverage provider listed on amion.

## 2023-09-29 ENCOUNTER — Inpatient Hospital Stay (HOSPITAL_COMMUNITY)
Admission: AD | Admit: 2023-09-29 | Discharge: 2023-10-10 | DRG: 945 | Disposition: A | Discharge status: Home / Self Care | Source: Intra-hospital | Attending: Physical Medicine and Rehabilitation | Admitting: Physical Medicine and Rehabilitation

## 2023-09-29 ENCOUNTER — Other Ambulatory Visit: Payer: Self-pay

## 2023-09-29 DIAGNOSIS — M1990 Unspecified osteoarthritis, unspecified site: Secondary | ICD-10-CM | POA: Diagnosis present

## 2023-09-29 DIAGNOSIS — Z981 Arthrodesis status: Secondary | ICD-10-CM

## 2023-09-29 DIAGNOSIS — K219 Gastro-esophageal reflux disease without esophagitis: Secondary | ICD-10-CM | POA: Diagnosis present

## 2023-09-29 DIAGNOSIS — R918 Other nonspecific abnormal finding of lung field: Secondary | ICD-10-CM | POA: Diagnosis not present

## 2023-09-29 DIAGNOSIS — Z85820 Personal history of malignant melanoma of skin: Secondary | ICD-10-CM

## 2023-09-29 DIAGNOSIS — N184 Chronic kidney disease, stage 4 (severe): Secondary | ICD-10-CM | POA: Diagnosis not present

## 2023-09-29 DIAGNOSIS — D72819 Decreased white blood cell count, unspecified: Secondary | ICD-10-CM | POA: Diagnosis not present

## 2023-09-29 DIAGNOSIS — S066XAD Traumatic subarachnoid hemorrhage with loss of consciousness status unknown, subsequent encounter: Secondary | ICD-10-CM | POA: Diagnosis not present

## 2023-09-29 DIAGNOSIS — S066X9S Traumatic subarachnoid hemorrhage with loss of consciousness of unspecified duration, sequela: Secondary | ICD-10-CM | POA: Diagnosis not present

## 2023-09-29 DIAGNOSIS — Z888 Allergy status to other drugs, medicaments and biological substances status: Secondary | ICD-10-CM

## 2023-09-29 DIAGNOSIS — R197 Diarrhea, unspecified: Secondary | ICD-10-CM | POA: Diagnosis not present

## 2023-09-29 DIAGNOSIS — S066XAA Traumatic subarachnoid hemorrhage with loss of consciousness status unknown, initial encounter: Principal | ICD-10-CM | POA: Diagnosis present

## 2023-09-29 DIAGNOSIS — E875 Hyperkalemia: Secondary | ICD-10-CM | POA: Diagnosis not present

## 2023-09-29 DIAGNOSIS — E785 Hyperlipidemia, unspecified: Secondary | ICD-10-CM | POA: Diagnosis present

## 2023-09-29 DIAGNOSIS — G3184 Mild cognitive impairment, so stated: Secondary | ICD-10-CM | POA: Diagnosis not present

## 2023-09-29 DIAGNOSIS — T451X5A Adverse effect of antineoplastic and immunosuppressive drugs, initial encounter: Secondary | ICD-10-CM | POA: Diagnosis not present

## 2023-09-29 DIAGNOSIS — Z5181 Encounter for therapeutic drug level monitoring: Secondary | ICD-10-CM | POA: Diagnosis not present

## 2023-09-29 DIAGNOSIS — I4819 Other persistent atrial fibrillation: Secondary | ICD-10-CM | POA: Diagnosis not present

## 2023-09-29 DIAGNOSIS — D469 Myelodysplastic syndrome, unspecified: Secondary | ICD-10-CM | POA: Diagnosis present

## 2023-09-29 DIAGNOSIS — I4892 Unspecified atrial flutter: Secondary | ICD-10-CM | POA: Diagnosis not present

## 2023-09-29 DIAGNOSIS — S43102D Unspecified dislocation of left acromioclavicular joint, subsequent encounter: Secondary | ICD-10-CM | POA: Diagnosis not present

## 2023-09-29 DIAGNOSIS — E440 Moderate protein-calorie malnutrition: Secondary | ICD-10-CM | POA: Insufficient documentation

## 2023-09-29 DIAGNOSIS — I480 Paroxysmal atrial fibrillation: Secondary | ICD-10-CM

## 2023-09-29 DIAGNOSIS — S066X0A Traumatic subarachnoid hemorrhage without loss of consciousness, initial encounter: Secondary | ICD-10-CM

## 2023-09-29 DIAGNOSIS — M47812 Spondylosis without myelopathy or radiculopathy, cervical region: Secondary | ICD-10-CM

## 2023-09-29 DIAGNOSIS — N400 Enlarged prostate without lower urinary tract symptoms: Secondary | ICD-10-CM | POA: Diagnosis present

## 2023-09-29 DIAGNOSIS — E872 Acidosis, unspecified: Secondary | ICD-10-CM | POA: Diagnosis present

## 2023-09-29 DIAGNOSIS — J9811 Atelectasis: Secondary | ICD-10-CM | POA: Diagnosis not present

## 2023-09-29 DIAGNOSIS — I517 Cardiomegaly: Secondary | ICD-10-CM | POA: Diagnosis not present

## 2023-09-29 DIAGNOSIS — Z6822 Body mass index (BMI) 22.0-22.9, adult: Secondary | ICD-10-CM | POA: Diagnosis not present

## 2023-09-29 DIAGNOSIS — J90 Pleural effusion, not elsewhere classified: Secondary | ICD-10-CM | POA: Diagnosis not present

## 2023-09-29 DIAGNOSIS — R609 Edema, unspecified: Secondary | ICD-10-CM | POA: Diagnosis not present

## 2023-09-29 DIAGNOSIS — Z87891 Personal history of nicotine dependence: Secondary | ICD-10-CM

## 2023-09-29 DIAGNOSIS — D649 Anemia, unspecified: Secondary | ICD-10-CM | POA: Diagnosis present

## 2023-09-29 DIAGNOSIS — D631 Anemia in chronic kidney disease: Secondary | ICD-10-CM | POA: Diagnosis not present

## 2023-09-29 DIAGNOSIS — Z7901 Long term (current) use of anticoagulants: Secondary | ICD-10-CM | POA: Diagnosis not present

## 2023-09-29 DIAGNOSIS — R059 Cough, unspecified: Secondary | ICD-10-CM

## 2023-09-29 DIAGNOSIS — I609 Nontraumatic subarachnoid hemorrhage, unspecified: Secondary | ICD-10-CM | POA: Diagnosis not present

## 2023-09-29 DIAGNOSIS — Z96611 Presence of right artificial shoulder joint: Secondary | ICD-10-CM | POA: Diagnosis not present

## 2023-09-29 DIAGNOSIS — G8929 Other chronic pain: Secondary | ICD-10-CM | POA: Diagnosis present

## 2023-09-29 DIAGNOSIS — Z79620 Long term (current) use of immunosuppressive biologic: Secondary | ICD-10-CM

## 2023-09-29 DIAGNOSIS — I10 Essential (primary) hypertension: Secondary | ICD-10-CM | POA: Diagnosis not present

## 2023-09-29 DIAGNOSIS — I959 Hypotension, unspecified: Secondary | ICD-10-CM | POA: Diagnosis not present

## 2023-09-29 DIAGNOSIS — Z8249 Family history of ischemic heart disease and other diseases of the circulatory system: Secondary | ICD-10-CM

## 2023-09-29 DIAGNOSIS — I440 Atrioventricular block, first degree: Secondary | ICD-10-CM | POA: Diagnosis not present

## 2023-09-29 DIAGNOSIS — N183 Chronic kidney disease, stage 3 unspecified: Secondary | ICD-10-CM

## 2023-09-29 DIAGNOSIS — Z48813 Encounter for surgical aftercare following surgery on the respiratory system: Secondary | ICD-10-CM | POA: Diagnosis not present

## 2023-09-29 DIAGNOSIS — S066X9D Traumatic subarachnoid hemorrhage with loss of consciousness of unspecified duration, subsequent encounter: Secondary | ICD-10-CM | POA: Diagnosis not present

## 2023-09-29 DIAGNOSIS — R600 Localized edema: Secondary | ICD-10-CM | POA: Diagnosis not present

## 2023-09-29 DIAGNOSIS — S066X0S Traumatic subarachnoid hemorrhage without loss of consciousness, sequela: Secondary | ICD-10-CM | POA: Diagnosis not present

## 2023-09-29 DIAGNOSIS — Z85828 Personal history of other malignant neoplasm of skin: Secondary | ICD-10-CM

## 2023-09-29 DIAGNOSIS — S46012D Strain of muscle(s) and tendon(s) of the rotator cuff of left shoulder, subsequent encounter: Secondary | ICD-10-CM

## 2023-09-29 DIAGNOSIS — W19XXXA Unspecified fall, initial encounter: Secondary | ICD-10-CM | POA: Diagnosis not present

## 2023-09-29 DIAGNOSIS — D7581 Myelofibrosis: Secondary | ICD-10-CM | POA: Diagnosis not present

## 2023-09-29 DIAGNOSIS — Z79899 Other long term (current) drug therapy: Secondary | ICD-10-CM

## 2023-09-29 DIAGNOSIS — N179 Acute kidney failure, unspecified: Secondary | ICD-10-CM | POA: Diagnosis not present

## 2023-09-29 DIAGNOSIS — E039 Hypothyroidism, unspecified: Secondary | ICD-10-CM | POA: Diagnosis present

## 2023-09-29 DIAGNOSIS — S0990XA Unspecified injury of head, initial encounter: Secondary | ICD-10-CM | POA: Diagnosis not present

## 2023-09-29 DIAGNOSIS — Z833 Family history of diabetes mellitus: Secondary | ICD-10-CM

## 2023-09-29 DIAGNOSIS — Z820 Family history of epilepsy and other diseases of the nervous system: Secondary | ICD-10-CM

## 2023-09-29 DIAGNOSIS — D61810 Antineoplastic chemotherapy induced pancytopenia: Secondary | ICD-10-CM | POA: Diagnosis not present

## 2023-09-29 DIAGNOSIS — Z79890 Hormone replacement therapy (postmenopausal): Secondary | ICD-10-CM

## 2023-09-29 DIAGNOSIS — I129 Hypertensive chronic kidney disease with stage 1 through stage 4 chronic kidney disease, or unspecified chronic kidney disease: Secondary | ICD-10-CM | POA: Diagnosis not present

## 2023-09-29 DIAGNOSIS — M1712 Unilateral primary osteoarthritis, left knee: Secondary | ICD-10-CM | POA: Diagnosis present

## 2023-09-29 LAB — CULTURE, BLOOD (ROUTINE X 2)
Culture: NO GROWTH
Culture: NO GROWTH
Special Requests: ADEQUATE
Special Requests: ADEQUATE

## 2023-09-29 LAB — CBC WITH DIFFERENTIAL/PLATELET
Abs Immature Granulocytes: 0.14 10*3/uL — ABNORMAL HIGH (ref 0.00–0.07)
Basophils Absolute: 0 10*3/uL (ref 0.0–0.1)
Basophils Relative: 0 %
Eosinophils Absolute: 0 10*3/uL (ref 0.0–0.5)
Eosinophils Relative: 0 %
HCT: 24.9 % — ABNORMAL LOW (ref 39.0–52.0)
Hemoglobin: 7.4 g/dL — ABNORMAL LOW (ref 13.0–17.0)
Immature Granulocytes: 12 %
Lymphocytes Relative: 27 %
Lymphs Abs: 0.3 10*3/uL — ABNORMAL LOW (ref 0.7–4.0)
MCH: 27.8 pg (ref 26.0–34.0)
MCHC: 29.7 g/dL — ABNORMAL LOW (ref 30.0–36.0)
MCV: 93.6 fL (ref 80.0–100.0)
Monocytes Absolute: 0.1 10*3/uL (ref 0.1–1.0)
Monocytes Relative: 7 %
Neutro Abs: 0.8 10*3/uL — ABNORMAL LOW (ref 1.7–7.7)
Neutrophils Relative %: 66 %
Platelets: 244 10*3/uL (ref 150–400)
RBC: 2.66 MIL/uL — ABNORMAL LOW (ref 4.22–5.81)
RDW: 19.4 % — ABNORMAL HIGH (ref 11.5–15.5)
Smear Review: NORMAL
WBC: 1.2 10*3/uL — CL (ref 4.0–10.5)
nRBC: 0 % (ref 0.0–0.2)
nRBC: 0 /100{WBCs}

## 2023-09-29 LAB — RENAL FUNCTION PANEL
Albumin: 2 g/dL — ABNORMAL LOW (ref 3.5–5.0)
Anion gap: 13 (ref 5–15)
BUN: 80 mg/dL — ABNORMAL HIGH (ref 8–23)
CO2: 15 mmol/L — ABNORMAL LOW (ref 22–32)
Calcium: 7.1 mg/dL — ABNORMAL LOW (ref 8.9–10.3)
Chloride: 110 mmol/L (ref 98–111)
Creatinine, Ser: 4.67 mg/dL — ABNORMAL HIGH (ref 0.61–1.24)
GFR, Estimated: 12 mL/min — ABNORMAL LOW (ref >60)
Glucose, Bld: 123 mg/dL — ABNORMAL HIGH (ref 70–99)
Phosphorus: 5.4 mg/dL — ABNORMAL HIGH (ref 2.5–4.6)
Potassium: 5.1 mmol/L (ref 3.5–5.1)
Sodium: 138 mmol/L (ref 135–145)

## 2023-09-29 LAB — CBC
HCT: 24.8 % — ABNORMAL LOW (ref 39.0–52.0)
Hemoglobin: 7.3 g/dL — ABNORMAL LOW (ref 13.0–17.0)
MCH: 27.5 pg (ref 26.0–34.0)
MCHC: 29.4 g/dL — ABNORMAL LOW (ref 30.0–36.0)
MCV: 93.6 fL (ref 80.0–100.0)
Platelets: 251 10*3/uL (ref 150–400)
RBC: 2.65 MIL/uL — ABNORMAL LOW (ref 4.22–5.81)
RDW: 19 % — ABNORMAL HIGH (ref 11.5–15.5)
WBC: 1.2 10*3/uL — CL (ref 4.0–10.5)
nRBC: 0 % (ref 0.0–0.2)

## 2023-09-29 MED ORDER — TRAMADOL HCL 50 MG PO TABS: 50.0000 mg | ORAL_TABLET | Freq: Two times a day (BID) | ORAL | Status: DC | PRN

## 2023-09-29 MED ORDER — POLYETHYLENE GLYCOL 3350 17 G PO PACK
17.0000 g | PACK | Freq: Every day | ORAL | Status: DC
  Administered 2023-09-30 – 2023-10-01 (×2): 17 g via ORAL
  Filled 2023-09-29 (×3): qty 1

## 2023-09-29 MED ORDER — GUAIFENESIN-DM 100-10 MG/5ML PO SYRP
5.0000 mL | ORAL_SOLUTION | ORAL | Status: DC | PRN
  Administered 2023-09-29 – 2023-10-01 (×3): 5 mL via ORAL
  Filled 2023-09-29 (×3): qty 10

## 2023-09-29 MED ORDER — ACETAMINOPHEN 650 MG RE SUPP: 650.0000 mg | Freq: Four times a day (QID) | RECTAL | Status: DC | PRN

## 2023-09-29 MED ORDER — SENNOSIDES-DOCUSATE SODIUM 8.6-50 MG PO TABS
1.0000 | ORAL_TABLET | Freq: Two times a day (BID) | ORAL | Status: DC
  Administered 2023-09-30 – 2023-10-03 (×6): 1 via ORAL
  Filled 2023-09-29 (×6): qty 1

## 2023-09-29 MED ORDER — QUETIAPINE FUMARATE 25 MG PO TABS
25.0000 mg | ORAL_TABLET | Freq: Every day | ORAL | Status: DC
  Administered 2023-09-29 – 2023-10-03 (×5): 25 mg via ORAL
  Filled 2023-09-29 (×5): qty 1

## 2023-09-29 MED ORDER — MUSCLE RUB 10-15 % EX CREA: TOPICAL_CREAM | CUTANEOUS | Status: DC | PRN

## 2023-09-29 MED ORDER — ACETAMINOPHEN 500 MG PO TABS: 500.0000 mg | ORAL_TABLET | Freq: Four times a day (QID) | ORAL | Status: DC | PRN

## 2023-09-29 MED ORDER — MELATONIN 3 MG PO TABS
3.0000 mg | ORAL_TABLET | Freq: Every evening | ORAL | Status: DC | PRN
  Administered 2023-10-02 – 2023-10-08 (×4): 3 mg via ORAL
  Filled 2023-09-29 (×4): qty 1

## 2023-09-29 MED ORDER — PANTOPRAZOLE SODIUM 40 MG PO TBEC
40.0000 mg | DELAYED_RELEASE_TABLET | Freq: Every day | ORAL | Status: DC
  Administered 2023-09-30 – 2023-10-10 (×11): 40 mg via ORAL
  Filled 2023-09-29 (×11): qty 1

## 2023-09-29 MED ORDER — PANTOPRAZOLE SODIUM 40 MG PO TBEC
40.0000 mg | DELAYED_RELEASE_TABLET | Freq: Every day | ORAL | Status: DC
  Administered 2023-09-29: 40 mg via ORAL
  Filled 2023-09-29: qty 1

## 2023-09-29 MED ORDER — LEVOTHYROXINE SODIUM 100 MCG PO TABS
100.0000 ug | ORAL_TABLET | Freq: Every day | ORAL | Status: DC
  Administered 2023-09-30 – 2023-10-10 (×9): 100 ug via ORAL
  Filled 2023-09-29 (×12): qty 1

## 2023-09-29 MED ORDER — AMIODARONE HCL 200 MG PO TABS
200.0000 mg | ORAL_TABLET | Freq: Every day | ORAL | Status: DC
  Administered 2023-09-30 – 2023-10-10 (×11): 200 mg via ORAL
  Filled 2023-09-29 (×11): qty 1

## 2023-09-29 MED ORDER — ADULT MULTIVITAMIN W/MINERALS CH
1.0000 | ORAL_TABLET | Freq: Every day | ORAL | Status: DC
  Administered 2023-09-30 – 2023-10-10 (×11): 1 via ORAL
  Filled 2023-09-29 (×11): qty 1

## 2023-09-29 MED ORDER — LIDOCAINE 5 % EX PTCH
1.0000 | MEDICATED_PATCH | CUTANEOUS | Status: DC
Start: 2023-09-29 — End: 2023-10-10
  Administered 2023-09-29 – 2023-10-09 (×11): 1 via TRANSDERMAL
  Filled 2023-09-29 (×11): qty 1

## 2023-09-29 MED ORDER — SODIUM BICARBONATE 650 MG PO TABS
1300.0000 mg | ORAL_TABLET | Freq: Three times a day (TID) | ORAL | Status: DC
  Administered 2023-09-29 – 2023-10-10 (×32): 1300 mg via ORAL
  Filled 2023-09-29 (×32): qty 2

## 2023-09-29 MED ORDER — MIDODRINE HCL 5 MG PO TABS: 5.0000 mg | ORAL_TABLET | Freq: Three times a day (TID) | ORAL | 3 refills | Status: DC

## 2023-09-29 MED ORDER — DULOXETINE HCL 20 MG PO CPEP
20.0000 mg | ORAL_CAPSULE | Freq: Every day | ORAL | Status: DC
  Administered 2023-09-29 – 2023-10-09 (×11): 20 mg via ORAL
  Filled 2023-09-29 (×11): qty 1

## 2023-09-29 MED ORDER — DEXAMETHASONE 4 MG PO TABS
10.0000 mg | ORAL_TABLET | Freq: Every day | ORAL | Status: DC
  Administered 2023-09-29 – 2023-10-06 (×8): 10 mg via ORAL
  Filled 2023-09-29 (×9): qty 4

## 2023-09-29 MED ORDER — SODIUM CHLORIDE 0.9 % IV SOLN: INTRAVENOUS | Status: DC

## 2023-09-29 MED ORDER — METHOCARBAMOL 500 MG PO TABS
500.0000 mg | ORAL_TABLET | Freq: Three times a day (TID) | ORAL | Status: DC | PRN
Start: 2023-09-29 — End: 2023-10-10

## 2023-09-29 MED ORDER — SODIUM BICARBONATE 650 MG PO TABS
1300.0000 mg | ORAL_TABLET | Freq: Three times a day (TID) | ORAL | Status: DC
  Administered 2023-09-29 (×2): 1300 mg via ORAL
  Filled 2023-09-29 (×2): qty 2

## 2023-09-29 MED ORDER — ONDANSETRON HCL 4 MG PO TABS: 4.0000 mg | ORAL_TABLET | Freq: Four times a day (QID) | ORAL | Status: DC | PRN

## 2023-09-29 MED ORDER — SIMVASTATIN 20 MG PO TABS
40.0000 mg | ORAL_TABLET | Freq: Every day | ORAL | Status: DC
Start: 2023-09-29 — End: 2023-10-10
  Administered 2023-09-29 – 2023-10-09 (×11): 40 mg via ORAL
  Filled 2023-09-29 (×11): qty 2

## 2023-09-29 MED ORDER — PANTOPRAZOLE SODIUM 40 MG PO TBEC: 40.0000 mg | DELAYED_RELEASE_TABLET | Freq: Every day | ORAL | Status: DC

## 2023-09-29 MED ORDER — MIDODRINE HCL 5 MG PO TABS
5.0000 mg | ORAL_TABLET | Freq: Three times a day (TID) | ORAL | Status: DC
  Administered 2023-09-30 – 2023-10-02 (×7): 5 mg via ORAL
  Filled 2023-09-29 (×7): qty 1

## 2023-09-29 MED ORDER — FILGRASTIM 300 MCG/ML IJ SOLN
300.0000 ug | Freq: Once | INTRAMUSCULAR | Status: AC
  Administered 2023-09-29: 300 ug via SUBCUTANEOUS
  Filled 2023-09-29 (×2): qty 1

## 2023-09-29 MED ORDER — SODIUM BICARBONATE 650 MG PO TABS: 1300.0000 mg | ORAL_TABLET | Freq: Three times a day (TID) | ORAL | 3 refills | Status: DC

## 2023-09-29 MED ORDER — ENSURE ENLIVE PO LIQD
237.0000 mL | Freq: Two times a day (BID) | ORAL | Status: DC
  Administered 2023-09-30 – 2023-10-02 (×5): 237 mL via ORAL

## 2023-09-29 MED ORDER — SORBITOL 70 % SOLN: 30.0000 mL | Freq: Every day | Status: DC | PRN

## 2023-09-29 MED ORDER — LEVOTHYROXINE SODIUM 100 MCG PO TABS: 100.0000 ug | ORAL_TABLET | Freq: Every day | ORAL | 3 refills | Status: DC

## 2023-09-29 MED ORDER — ONDANSETRON HCL 4 MG/2ML IJ SOLN: 4.0000 mg | Freq: Four times a day (QID) | INTRAMUSCULAR | Status: DC | PRN

## 2023-09-29 NOTE — Progress Notes (Signed)
 Inpatient Rehab Admissions Coordinator:   I have a CIR bed for this Pt today. RN may call report to [phone].  Pt. To admit to CIR for an estimated 10-12 days with the goal of discharging home with his wife.   Wandalee Gust, MS, CCC-SLP Rehab Admissions Coordinator  [phone] (celll) [phone] (office)

## 2023-09-29 NOTE — Progress Notes (Signed)
 PMR Admission Coordinator Pre-Admission Assessment   Patient: [name] is an 83 y.o., male MRN: [id number] DOB: 05/25/1941 Height: 5\' 11"  (180.3 cm) Weight: 72.6 kg   Insurance Information HMO:     PPO:      PCP:      IPA:      80/20: yes     OTHER:  PRIMARY: Medicare A and B      Policy#: [id number]    Subscriber:  CM Name:       Phone#:      Fax#:  Pre-Cert#: verified Health and safety inspector:  Benefits:  Phone #:      Name:  Eff. Date: a and B effective 05/06/2006    Deduct: $1632      Out of Pocket Max: n/a      Life Max: n/a CIR: 100%      SNF: 20 full days Outpatient:      Co-Pay:  Home Health: 100%      Co-Pay:  DME:      Co-Pay:  Providers:  SECONDARY: [employer] [place] Supplement      Policy#: [id number]      Phone#:    Financial Counselor:       Phone#:    The "Data Collection Information Summary" for patients in Inpatient Rehabilitation Facilities with attached "Privacy Act Statement-Health Care Records" was provided and verbally reviewed with: Pt   Emergency Contact Information Contact Information       Name Relation Home Work Mobile    [name] Spouse     [phone]         Other Contacts       Name Relation Home Work Mobile    [name] Son     [phone]           Current Medical History  Patient Admitting Diagnosis: SAH History of Present Illness: [name] is a 83 year old right-handed male with history of atrial fibrillation maintained on Eliquis , hypertension, hyperlipidemia, hypothyroidism, chronic anemia, cervical fusion C1-C5, CKD stage IV, BPH, CVA as well as 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  Per chart review patient lives with spouse.  Split-level home with 3 steps to entry.  Modified independent with straight point cane.  Presented 09/21/2023 after being found down in the yard.  Cranial CT scan showed focal subarachnoid hemorrhage involving a single sulcus of the left parietal lobe.  No edema mass effect or midline  shift.  Left frontal parietal scalp hematoma.  CT cervical spine negative.  CT of the chest abdomen pelvis showed no acute posttraumatic sequela in the chest abdomen or pelvis.  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  MRI of the shoulder showed widening of the acromioclavicular joint measuring 8-10 mm with disruption of the superior acromioclavicular ligament with associated joint effusion.  Supraspinatus and infraspinatus tendinosis with partial-thickness articular sided insertional tearing at the junctional fibers of the posterior supraspinatus and anterior infraspinatus tendons.  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  C5-6 moderate canal stenosis and severe bilateral foraminal stenosis as well as C6-7 severe left and moderate right foraminal stenosis with C3-4 severe right and moderate to severe left foraminal stenosis as well as C4-5.  Admission chemistries unremarkable except potassium 5.2 glucose 108 BUN 63 creatinine 3.77, hemoglobin 8.5, blood cultures Staphylococcus species.  Orthopedic service follow-up Dr. Randal Bury in regards to left  AC joint separation advise nonweightbearing left upper extremity with shoulder sling for comfort.  Follow-up neurosurgery regards to small subarachnoid hemorrhage advised conservative care and Eliquis  remains on hold.  Repeat cranial CT scan without contrast showed decrease subarachnoid hemorrhage.  Neurosurgery also followed up for cervical spondylosis and again advised conservative care/not a surgical candidate at this time and he was placed on Decadron  therapy.  Renal service is consulted for acute on chronic stage IV CKD with creatinine increasing to 4.76 from baseline of 3.5 and renal ultrasound completed showing no hydronephrosis and latest creatinine 5.01.  He has had bouts of hypotension requiring ProAmatine . Pt. Seen  by PT/oT and they recommended CIR to assist return to PLOF.  Complete NIHSS TOTAL: 3   Patient's medical record from [facility]  has been reviewed by the rehabilitation admission coordinator and physician.   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 No   Family History   family history includes Dementia in his sister; Diabetes in his son; Heart disease in his father.   Current Medications  Current Medications    Current Facility-Administered Medications:    0.9 %  sodium chloride  infusion, , Intravenous, Continuous, Nichole Barker, Vijaya, MD, Last Rate: 75 mL/hr at 09/27/23 1323, New Bag at 09/27/23 1323   acetaminophen  (TYLENOL ) tablet 500 mg, 500 mg, Oral, Q6H PRN, 500 mg at 09/25/23 1328 **OR** acetaminophen  (TYLENOL ) suppository 650 mg, 650 mg, Rectal, Q6H PRN, Claiborne, Claudia, MD   amiodarone  (PACERONE ) tablet 200 mg, 200 mg, Oral, Daily, Claiborne, Claudia, MD, 200 mg at 09/27/23 1038   dexamethasone  (DECADRON ) injection 10 mg, 10 mg, Intravenous, Q24H, Akula, Vijaya, MD, 10 mg at 09/26/23 1808   DULoxetine  (CYMBALTA ) DR capsule 20 mg, 20 mg, Oral, QHS, Claiborne, Claudia, MD, 20 mg at 09/26/23 2329   feeding supplement (ENSURE ENLIVE / ENSURE PLUS)  liquid 237 mL, 237 mL, Oral, BID BM, Akula, Vijaya, MD, 237 mL at 09/26/23 1353   HYDROmorphone  (DILAUDID ) injection 0.5 mg, 0.5 mg, Intravenous, Q4H PRN, Akula, Vijaya, MD, 0.5 mg at 09/26/23 1247   levothyroxine  (SYNTHROID ) tablet 100 mcg, 100 mcg, Oral, QAC breakfast, Akula, Vijaya, MD, 100 mcg at 09/27/23 0703   lidocaine  (LIDODERM ) 5 % 1 patch, 1 patch, Transdermal, Q24H, Cooper, Josseline P, PA-C, 1 patch at 09/26/23 1534   melatonin tablet 3 mg, 3 mg, Oral, QHS PRN, Claiborne, Claudia, MD, 3 mg at 09/24/23 2140   methocarbamol  (ROBAXIN ) tablet 500 mg, 500 mg, Oral, Q8H PRN, Akula, Vijaya, MD, 500 mg at  09/26/23 2330   midodrine  (PROAMATINE ) tablet 5 mg, 5 mg, Oral, TID WC, Akula, Vijaya, MD, 5 mg at 09/26/23 1808   multivitamin with minerals tablet 1 tablet, 1 tablet, Oral, Daily, Akula, Vijaya, MD, 1 tablet at 09/26/23 0954   ondansetron  (ZOFRAN ) tablet 4 mg, 4 mg, Oral, Q6H PRN **OR** ondansetron  (ZOFRAN ) injection 4 mg, 4 mg, Intravenous, Q6H PRN, Claiborne, Claudia, MD, 4 mg at 09/27/23 0846   pantoprazole  (PROTONIX ) injection 40 mg, 40 mg, Intravenous, Q24H, Rai, Ripudeep K, MD, 40 mg at 09/27/23 1243   QUEtiapine  (SEROQUEL ) tablet 25 mg, 25 mg, Oral, QHS, Claiborne, Claudia, MD, 25 mg at 09/26/23 2330   simvastatin  (ZOCOR ) tablet 40 mg, 40 mg, Oral, QHS, Claiborne, Claudia, MD, 40 mg at 09/26/23 2329   sodium bicarbonate  tablet 1,300 mg, 1,300 mg, Oral, BID, Peeples, Samuel J, MD, 1,300 mg at 09/26/23 2329   sorbitol  70 % solution 30 mL, 30 mL, Oral, Daily PRN, Claiborne, Claudia, MD   traMADol  (ULTRAM ) tablet 50 mg, 50 mg, Oral, Q12H PRN, Akula, Vijaya, MD, 50 mg at 09/26/23 0637   Facility-Administered Medications Ordered in Other Encounters:    epoetin  alfa-epbx (RETACRIT ) injection 10,000 Units, 10,000 Units, Subcutaneous, Once, Avonne Boettcher, MD   epoetin  alfa-epbx (RETACRIT ) injection 40,000 Units, 40,000 Units, Subcutaneous, Once, Avonne Boettcher, MD     Patients Current  Diet:  Diet Order                  Diet clear liquid Fluid consistency: Thin  Diet effective now                         Precautions / Restrictions Precautions Precautions: Fall Precaution/Restrictions Comments: caution with L shoulder, partial tear Restrictions Weight Bearing Restrictions Per Provider Order: No LUE Weight Bearing Per Provider Order: Non weight bearing Other Position/Activity Restrictions: Lt shoulder NWB, gentl ROM, sling for comfort. C2 ALL injury, Dr. Nichole Barker asked neurosurgery and no new restrictions/precautions added    Has the patient had 2 or more falls or a fall with injury in the past year? Yes   Prior Activity Level Community (5-7x/wk): Pt. active in the community PTA   Prior Functional Level Self Care: Did the patient need help bathing, dressing, using the toilet or eating? Independent   Indoor Mobility: Did the patient need assistance with walking from room to room (with or without device)? Independent   Stairs: Did the patient need assistance with internal or external stairs (with or without device)? Independent   Functional Cognition: Did the patient need help planning regular tasks such as shopping or remembering to take medications? Needed some help   Patient Information Are you of Hispanic, Latino/a,or Spanish origin?: A. No, not of Hispanic, Latino/a, or Spanish origin What is your race?: A. White Do you need or want an interpreter to communicate with a doctor or health care staff?: 0. No   Patient's Response To:  Health Literacy and Transportation Is the patient able to respond to health literacy and transportation needs?: Yes Health Literacy - How often do you need to have someone help you when you read instructions, pamphlets, or other written material from your doctor or pharmacy?: Never In the past 12 months, has lack of transportation kept you from medical appointments or from getting medications?: No In the past 12 months, has lack  of transportation kept you from meetings, work, or from getting things needed for daily living?: No   Home  Assistive Devices / Equipment Home Equipment: Other (comment), Grab bars - tub/shower, Grab bars - toilet, Cane - quad   Prior Device Use: Indicate devices/aids used by the patient prior to current illness, exacerbation or injury? Walker   Current Functional Level Cognition   Arousal/Alertness: Awake/alert Overall Cognitive Status: Impaired/Different from baseline Orientation Level: Oriented X4 Attention: Sustained Sustained Attention: Impaired Sustained Attention Impairment: Verbal basic Memory: Impaired Memory Impairment: Retrieval deficit Awareness: Appears intact Problem Solving: Impaired Problem Solving Impairment: Verbal basic    Extremity Assessment (includes Sensation/Coordination)   Upper Extremity Assessment: Generalized weakness RUE Deficits / Details: hx of R shoulder injury, AROM shoulder flexion ~90 degrees 3/5MMT. LUE Deficits / Details: Partial thicknes tear. AROM of elbow/wrist/hand WFL. some initiation into shoulder mobility, but per order gentle ROM, so provided AAROM flexion/abduction LUE: Shoulder pain at rest, Shoulder pain with ROM  Lower Extremity Assessment: Generalized weakness     ADLs   Overall ADL's : Needs assistance/impaired Eating/Feeding: Bed level, Set up Grooming: Minimal assistance, Standing Grooming Details (indicate cue type and reason): at sink; min A for locating grooming items and intermittent A for balance Upper Body Bathing: Sitting, Moderate assistance Lower Body Bathing: Sitting/lateral leans, Moderate assistance Upper Body Dressing : Sitting, Maximal assistance Lower Body Dressing: Sitting/lateral leans, Maximal assistance Toilet Transfer: Minimal assistance, +2 for safety/equipment, Ambulation Toileting- Clothing Manipulation and Hygiene: Maximal assistance, Sit to/from stand Functional mobility during ADLs: Minimal  assistance, +2 for safety/equipment     Mobility   Overal bed mobility: Needs Assistance Bed Mobility: Rolling, Sit to Supine, Sidelying to Sit Rolling: Min assist Sidelying to sit: Min assist Supine to sit: Min assist Sit to supine: Min assist General bed mobility comments: cues for hand placement wtih roll Rt and mod assist to bring Lt LE off EOB and raise trunk upright. Mod-max for controlled return to supine.     Transfers   Overall transfer level: Needs assistance Equipment used: 2 person hand held assist Transfers: Sit to/from Stand Sit to Stand: Min assist, From elevated surface (slightly elevated EOB) Bed to/from chair/wheelchair/BSC transfer type:: Lateral/scoot transfer Stand pivot transfers: Min assist, +2 physical assistance, +2 safety/equipment  Lateral/Scoot Transfers: Mod assist, Max assist General transfer comment: pt declining attempts to stand due to knee pain and weakness on Lt. Pt required max cues to sequence lateral scoot to [facility], mod-max assist for lift.     Ambulation / Gait / Stairs / Wheelchair Mobility   Ambulation/Gait General Gait Details: deferred, will need +2     Posture / Balance Balance Overall balance assessment: History of Falls, Needs assistance Sitting-balance support: No upper extremity supported, Feet supported Sitting balance-Leahy Scale: Fair Standing balance support: Bilateral upper extremity supported Standing balance-Leahy Scale: Poor Standing balance comment: reliant on ext support     Special needs/care consideration Special service needs none    Previous Home Environment (from acute therapy documentation) Living Arrangements: Spouse/significant other  Lives With: Spouse Available Help at Discharge: Family, Available 24 hours/day Type of Home: House Home Layout: Multi-level Alternate Level Stairs-Rails: Right Alternate Level Stairs-Number of Steps: 7 Home Access: Ramped entrance Entrance Stairs-Rails: Right, Left, Can reach  both Entrance Stairs-Number of Steps: 3 Bathroom Shower/Tub: Health visitor: Handicapped height Bathroom Accessibility: Yes Home Care Services: No Additional Comments: *wide base QC. Pt notes a few falls this year   Discharge Living Setting Plans for Discharge Living Setting: Patient's home Type of Home at Discharge: House Discharge Home Layout: Two level, Able to live on  main level with bedroom/bathroom Alternate Level Stairs-Rails: Right Alternate Level Stairs-Number of Steps: 7 Discharge Home Access: Ramped entrance Discharge Bathroom Shower/Tub: Walk-in shower Discharge Bathroom Toilet: Standard Discharge Bathroom Accessibility: Yes How Accessible: Accessible via walker, Accessible via wheelchair Does the patient have any problems obtaining your medications?: No   Social/Family/Support Systems Patient Roles: Spouse Contact Information: [place] Anticipated Caregiver: [phone] Anticipated Caregiver's Contact Information: MinA to supervision Caregiver Availability: 24/7 Discharge Plan Discussed with Primary Caregiver: Yes Is Caregiver In Agreement with Plan?: Yes Does Caregiver/Family have Issues with Lodging/Transportation while Pt is in Rehab?: No   Goals Patient/Family Goal for Rehab: PT/OT/SLP Supervision Expected length of stay: 10-12 days Pt/Family Agrees to Admission and willing to participate: Yes Program Orientation Provided & Reviewed with Pt/Caregiver Including Roles  & Responsibilities: Yes   Decrease burden of Care through IP rehab admission: not anticipated   Possible need for SNF placement upon discharge: not anticipated   Patient Condition: I have reviewed medical records from [facility], spoken with CM, and patient. I met with patient at the bedside for inpatient rehabilitation assessment.  Patient will benefit from ongoing PT, OT, and SLP, can actively participate in 3 hours of therapy a day 5 days of the week, and  can make measurable gains during the admission.  Patient will also benefit from the coordinated team approach during an Inpatient Acute Rehabilitation admission.  The patient will receive intensive therapy as well as Rehabilitation physician, nursing, social worker, and care management interventions.  Due to safety, skin/wound care, disease management, medication administration, pain management, and patient education the patient requires 24 hour a day rehabilitation nursing.  The patient is currently min A with mobility and basic ADLs.  Discharge setting and therapy post discharge at home with home health is anticipated.  Patient has agreed to participate in the Acute Inpatient Rehabilitation Program and will admit today.   Preadmission Screen Completed By:  Dorena Gander, 09/27/2023 1:40 PM ______________________________________________________________________   Discussed status with Dr. Rayleen Cal on 09/29/23 at 900 and received approval for admission today.   Admission Coordinator:  Dorena Gander, CCC-SLP, time 1130/Date 09/29/23    Assessment/Plan: Diagnosis: traumatic SAH  Does the need for close, 24 hr/day Medical supervision in concert with the patient's rehab needs make it unreasonable for this patient to be served in a less intensive setting? Yes Co-Morbidities requiring supervision/potential complications: PAF< Pleural effusion, cervical spondylosis s/p C spine fusion, CKD IV, L shoulder rotator cuff disorder, anemia, hypothyroidism, HLD, BPH, GERD, hypotension Due to bladder management, bowel management, safety, skin/wound care, disease management, medication administration, pain management, and patient education, does the patient require 24 hr/day rehab nursing? Yes Does the patient require coordinated care of a physician, rehab nurse, PT, OT, and SLP to address physical and functional deficits in the context of the above medical diagnosis(es)? Yes Addressing deficits in the following  areas: balance, endurance, locomotion, strength, transferring, bowel/bladder control, bathing, dressing, feeding, grooming, toileting, cognition, speech, language, swallowing, and psychosocial support Can the patient actively participate in an intensive therapy program of at least 3 hrs of therapy 5 days a week? Yes The potential for patient to make measurable gains while on inpatient rehab is excellent Anticipated functional outcomes upon discharge from inpatient rehab: supervision PT, supervision OT, supervision SLP Estimated rehab length of stay to reach the above functional goals is: 10-12 days Anticipated discharge destination: Home 10. Overall Rehab/Functional Prognosis: excellent     MD Signature: Lylia Sand

## 2023-09-29 NOTE — H&P (Signed)
 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  Per chart review patient lives with spouse.  Split-level home with 3 steps to entry.  Modified independent with straight point cane.  Presented 09/21/2023 after being found down in the yard.  Cranial CT scan showed focal subarachnoid hemorrhage involving a single sulcus of the left parietal lobe.  No edema mass effect or midline shift.  Left frontal parietal scalp hematoma.  CT cervical spine negative for fracture.  CT of the chest abdomen pelvis showed no acute posttraumatic sequela in the chest abdomen or pelvis.  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  Patient did undergo thoracentesis for right pleural effusion with 1.6 L yield.  MRI of the left shoulder showed widening of the acromioclavicular joint measuring 8-10 mm with disruption of the superior acromioclavicular ligament with associated joint effusion.  Supraspinatus and infraspinatus tendinosis with partial-thickness articular sided insertional tearing at the junctional fibers of the posterior supraspinatus and anterior infraspinatus tendons.  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  C5-6 moderate canal stenosis and severe bilateral foraminal stenosis as well as C6-7 severe left and moderate right foraminal stenosis with C3-4 severe right and  moderate to severe left foraminal stenosis as well as C4-5.  Admission chemistries unremarkable except potassium 5.2 glucose 108 BUN 63 creatinine 3.77, hemoglobin 8.5, blood cultures Staphylococcus species.  Patient was placed on vancomycin  for positive culture suspect contaminant vancomycin /Zosyn  is since been discontinued and repeat cultures ordered negative thus far.  Orthopedic service follow-up Dr. Randal Bury in regards to left [facility] joint separation advise nonweightbearing left upper extremity with shoulder sling for comfort. Patient denies shoulder pain. He does report chronic L knee pain, worsened since hospitalization. Reports prior L knee cortisone injections.  Follow-up neurosurgery regards to small subarachnoid hemorrhage advised conservative care and Eliquis  remains on hold.  Repeat cranial CT scan without contrast showed decrease subarachnoid hemorrhage.  Neurosurgery [name] also followed up for cervical spondylosis and again advised conservative care/not a surgical candidate at this time and he was placed on Decadron  therapy.  Renal service is consulted for acute on chronic stage IV CKD with creatinine increasing to 4.76-5.01-4.98-4.67 from baseline of 3.5 and renal ultrasound completed showing no hydronephrosis and patient was receiving IV fluids.  No current indications for hemodialysis.  Bouts of hyperkalemia requiring Lokelma  and monitoring of chemistries.  He has had bouts of hypotension requiring ProAmatine .  Anemia of chronic disease with latest hemoglobin 7.3-7.8.  Patient did experience bouts of nausea and vomiting 09/27/2023 KUB showed mild stool burden moderate gastric distention and bowel program regulated as well as maintained on Reglan .  Palliative care was consulted to establish goals of care.  Therapy evaluations completed due to patient's decreased functional mobility was admitted for a comprehensive rehab program.    Review of Systems  Constitutional:  Negative for chills  and fever.  HENT:  Negative for  hearing loss.   Eyes:  Negative for blurred vision and double vision.  Respiratory:  Positive for cough and sputum production. Negative for shortness of breath and wheezing.   Cardiovascular:  Positive for palpitations.  Gastrointestinal:  Positive for heartburn, nausea and vomiting.       GERD  Genitourinary:  Positive for urgency. Negative for dysuria, flank pain and hematuria.  Musculoskeletal:  Positive for joint pain, myalgias and neck pain.  Skin:  Negative for rash.  Neurological:  Positive for weakness. Negative for sensory change, speech change and headaches.  All other systems reviewed and are negative.  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   right forearm, ED[facility]   Stroke (HCC) 11/2007   R brain subcortical infarct   Past Surgical History:  Procedure Laterality Date   ASPIRATION / INJECTION RENAL CYST  07/08/2015   BACK SURGERY     approx 20- 25 years ago   COLONOSCOPY     EYE SURGERY     cataract both eyes   GAS INSERTION  08/11/2011   Procedure: INSERTION OF GAS;  Surgeon: Rexene Catching, MD;  Location: [facility] OR;  Service: Ophthalmology;  Laterality: Right;  C3F8   IR BONE MARROW BIOPSY & ASPIRATION  08/03/2023   IR THORACENTESIS ASP PLEURAL SPACE W/IMG GUIDE  09/22/2023   LEFT HEART CATH AND CORONARY ANGIOGRAPHY N/A 05/02/2023   Procedure: LEFT HEART CATH AND CORONARY ANGIOGRAPHY;  Surgeon: Sammy Crisp, MD;   Location: [facility] INVASIVE CV LAB;  Service: Cardiovascular;  Laterality: N/A;   REVERSE SHOULDER ARTHROPLASTY Right 04/13/2023   Procedure: REVERSE SHOULDER ARTHROPLASTY;  Surgeon: Elner Hahn, MD;  Location: [facility] ORS;  Service: Orthopedics;  Laterality: Right;   SCLERAL BUCKLE  08/11/2011   Procedure: SCLERAL BUCKLE;  Surgeon: Rexene Catching, MD;  Location: [facility] OR;  Service: Ophthalmology;  Laterality: Right;   VARICOSE VEIN SURGERY     Family History  Problem Relation Age of Onset   Heart disease Father    Dementia Sister    Diabetes Son    Kidney disease Neg Hx    Prostate cancer Neg Hx    Social History:  reports that he quit smoking about 13 years ago. His smoking use included cigarettes. He has never used smokeless tobacco. He reports that he does not drink alcohol and does not use drugs. Allergies:  Allergies  Allergen Reactions   Meloxicam Nausea And Vomiting   Medications Prior to Admission  Medication Sig Dispense Refill   acetaminophen  (TYLENOL ) 500 MG tablet Take 1,000 mg by mouth every 6 (six) hours as needed for mild pain (pain score 1-3).     amiodarone  (PACERONE ) 200 MG tablet Take 200 mg by mouth daily.     azelastine  (ASTELIN ) 0.1 % nasal spray Place 1 spray into both nostrils 2 (two) times daily. Use in each nostril as directed (Patient taking differently:  Place 1 spray into both nostrils 2 (two) times daily as needed for rhinitis or allergies. Use in each nostril as directed) 30 mL 0   calcium  carbonate (OSCAL) 1500 (600 Ca) MG TABS tablet Take by mouth.     Calcium  Citrate-Vitamin D (CITRACAL + D PO) Take 1 tablet by mouth in the morning.     Cholecalciferol (D3-1000 PO) Take 1,000 Units by mouth in the morning.     cyanocobalamin  1000 MCG tablet Take 1,000 mcg by mouth daily.     cyclobenzaprine  (FLEXERIL ) 10 MG tablet TAKE ONE TABLET BY MOUTH AT BEDTIME (Patient taking differently: Take 10 mg by mouth 3 (three) times daily as needed for muscle spasms.) 30 tablet 0    DULoxetine  (CYMBALTA ) 20 MG capsule Take 1 capsule (20 mg total) by mouth at bedtime. 90 capsule 1   gabapentin  (NEURONTIN ) 300 MG capsule Take 1 capsule (300 mg total) by mouth 2 (two) times daily. (Patient taking differently: Take 300 mg by mouth at bedtime.) 180 capsule 1   ketoconazole  (NIZORAL ) 2 % cream Apply twice daily to affected area on buttocks until clear 60 g 2   lenalidomide  (REVLIMID ) 5 MG capsule Take 1 capsule (5 mg total) by mouth daily. Take for 21 days, then hold for 7 days. Repeat every 28 days. 21 capsule 0   levothyroxine  (SYNTHROID ) 100 MCG tablet Take 1 tablet (100 mcg total) by mouth daily before breakfast. 90 tablet 3   lidocaine  (LIDODERM ) 5 % Place 1 patch onto the skin daily. Remove & Discard patch within 12 hours or as directed by MD (Patient taking differently: Place 1 patch onto the skin daily as needed (for pain).) 30 patch 2   Lidocaine -Menthol (NERVIVE ROLL-ON EX) Apply 1 application  topically every 4 (four) hours as needed (mild pain).     meclizine  (ANTIVERT ) 25 MG tablet TAKE 1 TABLET BY MOUTH THREE TIMES DAILYAS NEEDED FOR DIZZINESS (Patient taking differently: Take 25 mg by mouth 2 (two) times daily.) 90 tablet 6   melatonin 3 MG TABS tablet Take 1 tablet (3 mg total) by mouth at bedtime as needed (insomnia). 100 tablet 3   midodrine  (PROAMATINE ) 5 MG tablet Take 1 tablet (5 mg total) by mouth 3 (three) times daily with meals. 90 tablet 3   Multiple Vitamin (MULITIVITAMIN WITH MINERALS) TABS Take 1 tablet by mouth in the morning.     pantoprazole  (PROTONIX ) 40 MG tablet Take 1 tablet (40 mg total) by mouth daily. 90 tablet 1   QUEtiapine  (SEROQUEL ) 25 MG tablet TAKE ONE TABLET BY MOUTH AT BEDTIME 30 tablet 0   simvastatin  (ZOCOR ) 40 MG tablet TAKE ONE TABLET BY MOUTH EVERY DAY 90 tablet 3   sodium bicarbonate  650 MG tablet Take 2 tablets (1,300 mg total) by mouth 3 (three) times daily. 180 tablet 3   tamsulosin  (FLOMAX ) 0.4 MG CAPS capsule Take 2 capsules  (0.8 mg total) by mouth every evening. 180 capsule 1   traMADol  (ULTRAM ) 50 MG tablet Take by mouth.     zinc gluconate 50 MG tablet Take 50 mg by mouth in the morning.       Home: Home Living Family/patient expects to be discharged to:: Private residence Living Arrangements: Spouse/significant other Available Help at Discharge: Family, Available 24 hours/day Type of Home: House Home Access: Ramped entrance Entrance Stairs-Number of Steps: 3 Entrance Stairs-Rails: Right, Left, Can reach both Home Layout: Multi-level Alternate Level Stairs-Number of Steps: 7 Alternate Level Stairs-Rails: Right Bathroom Shower/Tub: Walk-in shower  Bathroom Toilet: Handicapped height Bathroom Accessibility: Yes Home Equipment: Other (comment), Grab bars - tub/shower, Grab bars - toilet, Cane - quad Additional Comments: *wide base QC. Pt notes a few falls this year  Lives With: Spouse   Functional History: Prior Function Prior Level of Function : Independent/Modified Independent, Driving, History of Falls (last six months) Mobility Comments: Mod I with SPC ADLs Comments: Indep with ADLs but limited with RUE due to pain prior to surgery   Functional Status:  Mobility: Bed Mobility Overal bed mobility: Needs Assistance Bed Mobility: Supine to Sit Rolling: Min assist Sidelying to sit: Min assist Supine to sit: Min assist, Contact guard, +2 for safety/equipment Sit to supine: Min assist General bed mobility comments: Min A for trunk to mid line with pt pulling up with PT hand and second person near by CGA at trunk Transfers Overall transfer level: Needs assistance Equipment used: 2 person hand held assist Transfers: Sit to/from Stand Sit to Stand: Min assist, +2 physical assistance Bed to/from chair/wheelchair/BSC transfer type:: Step pivot Stand pivot transfers: Min assist, +2 physical assistance, +2 safety/equipment Step pivot transfers: Min assist, +2 safety/equipment  Lateral/Scoot  Transfers: Mod assist, Max assist General transfer comment: Min A initially in standing for balance. Two person Min A for sit to stand for initial momentum to get to standing. Pt was able to take steps from EOB to recliner. Short steps with low foot clearance. Ambulation/Gait General Gait Details: deferred, will need +2 Pre-gait activities: Marching in place with 2 person Min A HHA then Min A for steps from EOB to recliner with partial step through to short step through with low foot clearance.   ADL: ADL Overall ADL's : Needs assistance/impaired Eating/Feeding: Bed level, Set up Grooming: Minimal assistance, Standing Grooming Details (indicate cue type and reason): at sink; min A for locating grooming items and intermittent A for balance Upper Body Bathing: Sitting, Moderate assistance Lower Body Bathing: Sitting/lateral leans, Moderate assistance Upper Body Dressing : Sitting, Maximal assistance Lower Body Dressing: Sitting/lateral leans, Maximal assistance Toilet Transfer: Minimal assistance, +2 for safety/equipment, Ambulation Toileting- Clothing Manipulation and Hygiene: Maximal assistance, Sit to/from stand Functional mobility during ADLs: Minimal assistance, +2 for safety/equipment   Cognition: Cognition Overall Cognitive Status: Impaired/Different from baseline Arousal/Alertness: Awake/alert Orientation Level: Oriented X4 Attention: Sustained Sustained Attention: Impaired Sustained Attention Impairment: Verbal basic Memory: Impaired Memory Impairment: Retrieval deficit Awareness: Appears intact Problem Solving: Impaired Problem Solving Impairment: Verbal basic Cognition Arousal: Alert Behavior During Therapy: WFL for tasks assessed/performed Overall Cognitive Status: Impaired/Different from baseline    Physical Exam: Blood pressure (!) 152/81, pulse 71, temperature 98.3 F (36.8 C), resp. rate 18, weight 73 kg, SpO2 94%.  General: No apparent distress HEENT: Large  abrasion on his forehead  Neck: Supple without JVD or lymphadenopathy Heart: Reg rate and rhythm. No murmurs rubs or gallops Chest: CTA bilaterally, occasional cough, non-labored breathing on room air Abdomen: Soft, non-tender, non-distended, bowel sounds positive. Extremities: No clubbing, cyanosis, or edema. Pulses are 2+ Psych: Pt's affect is appropriate. Pt is cooperative Skin: Clean and intact without signs of breakdown Neuro:    Mental Status: AAOx to person, date, Hospital , He says [place] as city. Delayed responses at times Memory: able to recall 3/3 words 5 min later, occasional long term occasional memory deficits noted Speech/Languate: Naming and repetition intact, fluent, follows simple commands CRANIAL NERVES: II: PERRL. Visual fields full III, IV, VI: EOM intact, no gaze preference or deviation V: normal sensation bilaterally VII: no asymmetry  VIII: Hard of hearing IX, X: normal palatal elevation XI: Very limited head ROM in both directions XII: Tongue midline   MOTOR: RUE: 4/5 Deltoid, 4+/5 Biceps, 4+/5 Triceps,4+/5 Grip LUE: at least  4/5 Biceps, 4/5 Triceps, 4/5 Grip, able to lift shoulder to gravity- limited by NWB status RLE: HF 4+/5, KE 5/5, ADF 4+/5, APF 4+/5 LLE: HF 4+/5, KE 5/5, ADF 4+/5, APF 4+/5  SENSORY: Normal to touch all 4 extremities  Coordination: No ataxia or dysmetria   MSK: L knee TTP, crepitus L shoulder sling     Results for orders placed or performed during the hospital encounter of 09/21/23 (from the past 48 hours)  CBC     Status: Abnormal   Collection Time: 09/28/23  5:12 AM  Result Value Ref Range   WBC 1.6 (L) 4.0 - 10.5 K/uL   RBC 2.80 (L) 4.22 - 5.81 MIL/uL   Hemoglobin 7.8 (L) 13.0 - 17.0 g/dL   HCT 40.9 (L) 81.1 - 91.4 %   MCV 92.9 80.0 - 100.0 fL   MCH 27.9 26.0 - 34.0 pg   MCHC 30.0 30.0 - 36.0 g/dL   RDW 78.2 (H) 95.6 - 21.3 %   Platelets 297 150 - 400 K/uL   nRBC 0.0 0.0 - 0.2 %    Comment: Performed at [facility], [address] [place], [place] [address]  Renal function panel     Status: Abnormal   Collection Time: 09/28/23  5:12 AM  Result Value Ref Range   Sodium 140 135 - 145 mmol/L   Potassium 4.8 3.5 - 5.1 mmol/L   Chloride 111 98 - 111 mmol/L   CO2 17 (L) 22 - 32 mmol/L   Glucose, Bld 125 (H) 70 - 99 mg/dL    Comment: Glucose reference range applies only to samples taken after fasting for at least 8 hours.   BUN 76 (H) 8 - 23 mg/dL   Creatinine, Ser 8.46 (H) 0.61 - 1.24 mg/dL   Calcium  7.3 (L) 8.9 - 10.3 mg/dL   Phosphorus 6.3 (H) 2.5 - 4.6 mg/dL   Albumin  2.1 (L) 3.5 - 5.0 g/dL   GFR, Estimated 11 (L) >60 mL/min    Comment: (NOTE) Calculated using the CKD-EPI Creatinine Equation (2021)    Anion gap 12 5 - 15    Comment: Performed at [facility], [address] [place], [place] [address]  CBC     Status: Abnormal   Collection Time: 09/29/23  6:45 AM  Result Value Ref Range   WBC 1.2 (LL) 4.0 - 10.5 K/uL    Comment: This critical result has verified and been called to L FINLEY RN 3W by Prentice Brochure on 04 25 2025 at 0818, and has been read back.  REPEATED TO VERIFY CORRECTED ON 04/25 AT 1002: PREVIOUSLY REPORTED AS 1.2 This critical result has verified and been called to L FINLEY RN 3W by Prentice Brochure on 04 25 2025 at 0818, and has been read back.     RBC 2.65 (L) 4.22 - 5.81 MIL/uL   Hemoglobin 7.3 (L) 13.0 - 17.0 g/dL   HCT 28.4 (L) 13.2 - 44.0 %   MCV 93.6 80.0 - 100.0 fL   MCH 27.5 26.0 - 34.0 pg   MCHC 29.4 (L) 30.0 - 36.0 g/dL   RDW 10.2 (H) 72.5 - 36.6 %   Platelets 251 150 - 400 K/uL   nRBC 0.0 0.0 - 0.2 %    Comment: Performed  at [facility], [address] [place], [place] [address]  Renal function panel     Status: Abnormal   Collection Time: 09/29/23  6:45 AM  Result Value Ref Range   Sodium 138 135 - 145 mmol/L   Potassium 5.1 3.5 - 5.1 mmol/L   Chloride 110 98 - 111 mmol/L   CO2 15 (L) 22 - 32 mmol/L   Glucose, Bld  123 (H) 70 - 99 mg/dL    Comment: Glucose reference range applies only to samples taken after fasting for at least 8 hours.   BUN 80 (H) 8 - 23 mg/dL   Creatinine, Ser 6.04 (H) 0.61 - 1.24 mg/dL   Calcium  7.1 (L) 8.9 - 10.3 mg/dL   Phosphorus 5.4 (H) 2.5 - 4.6 mg/dL   Albumin  2.0 (L) 3.5 - 5.0 g/dL   GFR, Estimated 12 (L) >60 mL/min    Comment: (NOTE) Calculated using the CKD-EPI Creatinine Equation (2021)    Anion gap 13 5 - 15    Comment: Performed at [facility], [address] [place], [place] [address]  CBC with Differential/Platelet     Status: Abnormal   Collection Time: 09/29/23  6:45 AM  Result Value Ref Range   WBC 1.2 (LL) 4.0 - 10.5 K/uL    Comment: CRITICAL VALUE NOTED.  VALUE IS CONSISTENT WITH PREVIOUSLY REPORTED AND CALLED VALUE. REPEATED TO VERIFY    RBC 2.66 (L) 4.22 - 5.81 MIL/uL   Hemoglobin 7.4 (L) 13.0 - 17.0 g/dL   HCT 11.9 (L) 14.7 - 82.9 %   MCV 93.6 80.0 - 100.0 fL   MCH 27.8 26.0 - 34.0 pg   MCHC 29.7 (L) 30.0 - 36.0 g/dL   RDW 56.2 (H) 13.0 - 86.5 %   Platelets 244 150 - 400 K/uL   nRBC 0.0 0.0 - 0.2 %   Neutrophils Relative % 66 %   Neutro Abs 0.8 (L) 1.7 - 7.7 K/uL   Lymphocytes Relative 27 %   Lymphs Abs 0.3 (L) 0.7 - 4.0 K/uL   Monocytes Relative 7 %   Monocytes Absolute 0.1 0.1 - 1.0 K/uL   Eosinophils Relative 0 %   Eosinophils Absolute 0.0 0.0 - 0.5 K/uL   Basophils Relative 0 %   Basophils Absolute 0.0 0.0 - 0.1 K/uL   WBC Morphology Moderate Left Shift (>5% metas and myelos)     Comment: VACUOLATED NEUTROPHILS   RBC Morphology OVALOCYTES     Comment: TEARDROP CELLS POLYCHROMASIA PRESENT    Smear Review Normal platelet morphology    nRBC 0 0 /100 WBC   Immature Granulocytes 12 %   Abs Immature Granulocytes 0.14 (H) 0.00 - 0.07 K/uL    Comment: Performed at [facility], [address], [place], [place] [address]   *Note: Due to a large number of results and/or encounters for the requested time period, some results  have not been displayed. A complete set of results can be found in Results Review.   No results found.     Blood pressure (!) 152/81, pulse 71, temperature 98.3 F (36.8 C), resp. rate 18, weight 73 kg, SpO2 94%.  Medical Problem List and Plan: 1. 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  Antithrombotics: -DVT/anticoagulation:  Mechanical: Antiembolism stockings, thigh (TED hose) Bilateral lower extremities.  Chronic Eliquis  currently on hold due to [facility]  -antiplatelet therapy: N/A 3. Pain  Management: Lighted Derm patch, Robaxin  as needed, tramadol  as needed 4. Mood/Behavior/Sleep: Provide emotional support  -antipsychotic agents: Seroquel  25 mg nightly 5. Neuropsych/cognition: This patient is capable of making decisions on his own behalf. 6. Skin/Wound Care: Routine skin checks 7. Fluids/Electrolytes/Nutrition: Routine ins and outs with follow-up chemistries 8.  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 Neurosurgery follow-up no current plan for surgical intervention however patient is currently maintained on Decadron  therapy for pain control. 9.  Recurrent large right pleural effusion.  Status post ultrasound thoracentesis 1.6 L fluid removed. 10.  PAF.  Rate controlled.  Continue amiodarone .  Eliquis  on hold due to [facility]. 11.  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  Follow-up nephrology services.  Renal ultrasound no hydronephrosis.  Currently no plans for HD. Avoid nephrotoxins. Monitor labs. Maintain MAP > 65 12.  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  Follow-up orthopedic services Dr. Abigail Abler.  Nonweightbearing left upper extremity with shoulder sling for comfort. 13.  Acute on chronic  anemia/myelofibrosis.  Followed by oncology services [name].  Prior to admission patient was receiving Revlimid  5 mg mouth daily for 21 days then hold for 7 days repeating every 28 days 14.  Hypothyroidism.  Synthroid  15.  Hyperlipidemia.  Zocor  16.  History of BPH.  Check PVR 17.  GERD.  Protonix  18.  Hypotension.  Followed by renal services with plan to slowly wean ProAmatine . 19. Occasional cough, reports started couple hours ago  -Start robitussin   -Repeat Xray chest if does not improve 20. Leukopenia  -Oncology ordered GCSF, CBC daily 21. L knee OA  -Xray nov 2024 indicates moderate OA  -Muscle rub prn    Sterling Eisenmenger, PA-C 09/29/2023  I have personally performed a face to face diagnostic evaluation of this patient and formulated the key components of the plan.  Additionally, I have personally reviewed laboratory data, imaging studies, as well as relevant notes and concur with the physician assistant's documentation above.  The patient's status has not changed from the original H&P.  Any changes in documentation from the acute care chart have been noted above.  Lylia Sand, MD

## 2023-09-29 NOTE — Discharge Summary (Signed)
 Physician Discharge Summary   Patient: [name] MRN: [id number] DOB: June 05, 1941  Admit date:     09/21/2023  Discharge date: 09/29/23  Discharge Physician: Christian Brocks, MD    PCP: Christian Dupre, DO   Recommendations at discharge:   Patient receiving Neupogen  300 SQ x 1 today for leukopenia per oncology recommendations(Christian [name]).  Please follow CBC with differential daily.  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 male with medical history significant for atrial fibrillation on Eliquis , CKD stage IV, GERD, HTN, HLP, hypothyroidism, BPH, prior CVA, reported on admission that he was in his yard.  He remembered walking up a ramp and the next and he knows he is in the hospital.  The patient was found on the ground in his yard.  He reported that he was feeling fine prior to the fall.  His wife reported that they had actually packed and were on their way to the beach later that day. Patient is on Eliquis  he was brought in as a level 2 trauma.  CT scan of the head neck abdomen and pelvis done and the only acute abnormality was a small subarachnoid hemorrhage.  Neurosurgery was called, recommended no follow up.  Patient was also complaining of left shoulder pain. MRI of the left shoulder done and orthopedics consulted for further evaluation.    MRI of the neck  ordered and repeat CT head ordered for follow up. Patient continues to have neck pain.  Neurosurgery was re-consulted,. [name] saw the patient recommended conservative management at this time.   Assessment and Plan:   Small subarachnoid  hemorrhage -Neurosurgery was consulted and recommended to monitor for changes in neurological exam. He is alert and oriented, and  has no focal deficits.  -Continue to monitor the patient off Eliquis . - Repeat CT head without contrast on 09/24/23 showed decreased subarachnoid hemorrhage. PT evaluation recommending CIR     Nausea vomiting abdominal pain: Due to ileus and constipation - KUB 4/23 showed ileus with constipation - Patient was placed on bowel regimen, nausea vomiting abdominal pain improved after constipation was resolved  - He is now tolerating solid diet without difficulty. -Continue bowel regiment with MiraLAX , Senokot-S, sorbitol  as needed  Neck pain:  - Left sided neck pain, left shoulder pain and with some tingling and numbness of the left hand.  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 Moderate cervical stenosis.  -Neurosurgery was re consulted, seen by Christian. Michale Francis on 4/21, fused from C1-C5, longstanding spondylitic changes in his neck but no critical stenosis of the spinal canal in the C-spine, however the risks and possible benefits do not justify operative intervention.   -added IV Decadron  for pain control, robaxin  for symptomatic management.  Recommend Medrol  Dosepak or prednisone  with taper at discharge with outpatient follow-up with neurosurgery     Large right pleural  effusion -Status post ultrasound thoracentesis, 1.6 lit fluid removed.  Pleural fluid protein level is 3.3  and LDH is 230 one of the light's criteria is met for exudative effusion.  Empirically started him on IV zosyn  on  admission and discontinued on 4/22 as his cultures are negative.  Gram stain is negative Cytology showed no malignant cells    Paroxysmal atrial fibrillation - Rate controlled - off Eliquis  for the subarachnoid hemorrhage     History of myelofibrosis with iron deficiency anemia, leukopenia - WBCs 1.2, discussed with oncology on-call, ChristianIruku, who also discussed with patient's oncologist, [name] at [facility] -Plan for 1 dose of filgrastim  today and oncology recommended to follow CBC at CIR daily for improvement -Oncology will monitor the count.   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 Pt received IV contrast on admission, he had an episode of hypotension and was on vancomycin  briefly for GPC in blood cultures. Baseline creatinine around 3.5 -Creatinine trending up, renal ultrasound negative for hydronephrosis - Creatinine plateaued at 5.01, now improving, 4.6 -Nephrology following, recommended supportive care   GPC bacteremia-contaminant  Staph capitis on final report. Suspect its a contaminant.  Vancomycin  discontinued Repeat cultures ordered, negative so far.      Iron deficiency anemia   Baseline hemoglobin ranging between 7 to 9.  H&H within baselines, 7.4, transfuse if less than 7    Hypothyroidism TSH is 30, free t4 is low, patient is on synthroid  75 mcg - Synthroid  increased to 100 mcg daily   Left shoulder pain.  From the fall.  MRI of the left shoulder shows Partial-thickness tear at the origin of the acromial head of the deltoid muscle with associated grade 2 strain.  Widening of the acromioclavicular joint measuring 8-10 mm with disruption of the superior acromioclavicular ligament and associated joint effusion. These findings are compatible with [place] type II injury. - Orthopedics consulted, recommended sling and outpatient follow up.      Hypotension:  Continue midodrine .    In view of his multiple medical issues, worsening  renal parameters and progressive decline, palliative care consulted for GOC.       Pressure Injury Documentation: Abrasion on the left face above left eyebrow, POA     Estimated body mass index is 22.32 kg/m as calculated from the following:   Height as of this encounter: 5\' 11"  (1.803 m).   Weight as of this encounter: 72.6 kg.      Pain control - [place]  Controlled Substance Reporting System database was reviewed. and patient was instructed, not to drive, operate heavy machinery, perform activities at heights, swimming or participation in water  activities or provide baby-sitting services while on Pain, Sleep and Anxiety Medications; until their outpatient Physician has advised to do so again. Also recommended to not to take more than prescribed Pain, Sleep and Anxiety Medications.  Consultants: Neurosurgery, orthopedics, oncology Procedures performed: None Disposition: Rehabilitation facility Diet recommendation: Carb modified diet, soft  DISCHARGE MEDICATION: Allergies as of 09/29/2023       Reactions   Meloxicam Nausea And Vomiting        Medication List     STOP taking these medications    amLODipine  10 MG tablet Commonly known as: NORVASC    apixaban  2.5 MG Tabs tablet Commonly known as: ELIQUIS    furosemide  40 MG tablet Commonly known as: LASIX    metoprolol  succinate 25 MG 24 hr tablet Commonly known as: TOPROL -XL   predniSONE  10 MG tablet  Commonly known as: DELTASONE        TAKE these medications    acetaminophen  500 MG tablet Commonly known as: TYLENOL  Take 1,000 mg by mouth every 6 (six) hours as needed for mild pain (pain score 1-3).   amiodarone  200 MG tablet Commonly known as: PACERONE  Take 200 mg by mouth daily.   azelastine  0.1 % nasal spray Commonly known as: ASTELIN  Place 1 spray into both nostrils 2 (two) times daily. Use in each nostril as directed What changed:  when to take this reasons to take this   calcium  carbonate  1500 (600 Ca) MG Tabs tablet Commonly known as: OSCAL Take by mouth.   CITRACAL + D PO Take 1 tablet by mouth in the morning.   cyanocobalamin  1000 MCG tablet Take 1,000 mcg by mouth daily.   cyclobenzaprine  10 MG tablet Commonly known as: FLEXERIL  TAKE ONE TABLET BY MOUTH AT BEDTIME What changed:  when to take this reasons to take this   D3-1000 PO Take 1,000 Units by mouth in the morning.   DULoxetine  20 MG capsule Commonly known as: CYMBALTA  Take 1 capsule (20 mg total) by mouth at bedtime.   gabapentin  300 MG capsule Commonly known as: NEURONTIN  Take 1 capsule (300 mg total) by mouth 2 (two) times daily. What changed: when to take this   ketoconazole  2 % cream Commonly known as: NIZORAL  Apply twice daily to affected area on buttocks until clear   lenalidomide  5 MG capsule Commonly known as: REVLIMID  Take 1 capsule (5 mg total) by mouth daily. Take for 21 days, then hold for 7 days. Repeat every 28 days. What changed: when to take this   levothyroxine  100 MCG tablet Commonly known as: SYNTHROID  Take 1 tablet (100 mcg total) by mouth daily before breakfast. What changed: Another medication with the same name was removed. Continue taking this medication, and follow the directions you see here.   lidocaine  5 % Commonly known as: Lidoderm  Place 1 patch onto the skin daily. Remove & Discard patch within 12 hours or as directed by MD What changed:  when to take this reasons to take this additional instructions   meclizine  25 MG tablet Commonly known as: ANTIVERT  TAKE 1 TABLET BY MOUTH THREE TIMES DAILYAS NEEDED FOR DIZZINESS What changed:  how much to take how to take this when to take this additional instructions   melatonin 3 MG Tabs tablet Take 1 tablet (3 mg total) by mouth at bedtime as needed (insomnia).   midodrine  5 MG tablet Commonly known as: PROAMATINE  Take 1 tablet (5 mg total) by mouth 3 (three) times daily with meals.   multivitamin with  minerals Tabs tablet Take 1 tablet by mouth in the morning.   NERVIVE ROLL-ON EX Apply 1 application  topically every 4 (four) hours as needed (mild pain).   pantoprazole  40 MG tablet Commonly known as: PROTONIX  Take 1 tablet (40 mg total) by mouth daily.   QUEtiapine  25 MG tablet Commonly known as: SEROQUEL  TAKE ONE TABLET BY MOUTH AT BEDTIME   simvastatin  40 MG tablet Commonly known as: ZOCOR  TAKE ONE TABLET BY MOUTH EVERY DAY   sodium bicarbonate  650 MG tablet Take 2 tablets (1,300 mg total) by mouth 3 (three) times daily.   tamsulosin  0.4 MG Caps capsule Commonly known as: FLOMAX  Take 2 capsules (0.8 mg total) by mouth every evening.   traMADol  50 MG tablet Commonly known as: ULTRAM  Take by mouth.   zinc gluconate 50 MG tablet Take 50 mg  by mouth in the morning.        Follow-up Information     Saundra Curl, MD. Schedule an appointment as soon as possible for a visit in 2 week(s).   Specialty: Orthopedic Surgery Contact information: [address] [place] [address] [phone]         Terre Ferri P, DO. Schedule an appointment as soon as possible for a visit in 2 week(s).   Specialty: Family Medicine Why: for hospital follow-up Contact information: [address] [place] [address] [phone]         Audie Bleacher, MD. Schedule an appointment as soon as possible for a visit in 2 week(s).   Specialty: Neurosurgery Why: for hospital follow-up Contact information: [address] [place] [address] [phone]                Discharge Exam: Cleavon Curls Weights   09/21/23 1420  Weight: 72.6 kg   S: No acute complaints.  No chest pain, shortness of breath, fevers or chills, nausea or vomiting.  Tolerating solid diet.  BP 119/82 (BP Location: Right Arm)   Pulse 76   Temp 98.4 F (36.9 C)   Resp 19   Ht 5\' 11"  (1.803 m)   Wt 72.6 kg   SpO2 95%   BMI 22.32 kg/m   Physical  Exam General: Alert and oriented x 3, NAD, abrasion on the left face above eyebrow Cardiovascular: S1 S2 clear, RRR.  Respiratory: CTAB Gastrointestinal: Soft, nontender, nondistended, NBS Ext: no pedal edema bilaterally Neuro: no new deficits Psych: Normal affect    Condition at discharge: fair  The results of significant diagnostics from this hospitalization (including imaging, microbiology, ancillary and laboratory) are listed below for reference.   Imaging Studies: DG Abd Portable 1V Result Date: 09/27/2023 CLINICAL DATA:  Nausea, vomiting. EXAM: PORTABLE ABDOMEN - 1 VIEW COMPARISON:  July 02, 2009. FINDINGS: Moderate gastric distention is noted. No significant dilatation of large or small bowel is noted. Mild amount of stool seen throughout the colon. IMPRESSION: Mild stool burden. Moderate gastric distention is noted of uncertain etiology. Electronically Signed   By: Rosalene Colon M.D.   On: 09/27/2023 12:14   US  RENAL Result Date: 09/26/2023 CLINICAL DATA:  [id number] AKI (acute kidney injury) (HCC) [id number] EXAM: RENAL / URINARY TRACT ULTRASOUND COMPLETE COMPARISON:  September 21, 2023 FINDINGS: Right Kidney: Renal measurements: 11.6 x 5.9 x 6.3 cm = volume: 224 mL. Multiple cysts, the largest measuring 7.3 x 6.3 x 7.1 cm. Increased cortical echogenicity. No hydronephrosis or nephrolithiasis. Left Kidney: Renal measurements: 12.4 x 5.1 x 5.4 cm = volume: 149 mL. Increased echogenicity No hydronephrosis or nephrolithiasis. Multiple cysts, the largest measures 5.5 x 5.6 x 6.4 cm. Bladder: Appears normal for degree of bladder distention. Other: Lobular hypoechogenicity in the gallbladder measuring 2.9 x 1.4 x 2.9 cm. 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 IMPRESSION: No hydronephrosis or nephrolithiasis. 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 Electronically Signed   By: Rance Burrows M.D.   On: 09/26/2023 08:20   MR CERVICAL SPINE WO  CONTRAST Result Date: 09/25/2023 CLINICAL DATA:  acute neck pain, fall. EXAM: MRI CERVICAL SPINE WITHOUT CONTRAST TECHNIQUE: Multiplanar, multisequence MR imaging of the cervical spine was performed. No intravenous contrast was administered. COMPARISON:  None Available. FINDINGS: Alignment: Approximately 4 mm of anterolisthesis of C5 on C6, similar to recent CT. No other sagittal subluxation. Vertebrae: Please see recent  CT of the cervical spine for characterization of bony detail. There is heterogeneous marrow without visible marrow edema. No suspicious bone lesions. Cord: Normal cord signal. Posterior Fossa, vertebral arteries, paraspinal tissues: Small volume of prevertebral edema, greatest in the upper cervical spine and extending inferiorly to the cervicothoracic junction. Uplifting of the anterior longitudinal ligament with suspected disruption at C2 (for example see series 2, image 10). Incomplete STIR suppression in areas limits assessment. Disc levels: Severe craniocervical degenerative change. C2-C3: Facet arthropathy. No significant canal or foraminal stenosis. C3-C4: Posterior disc osteophyte complex. Bilateral facet and uncovertebral hypertrophy. Resulting severe right and moderate to severe left foraminal stenosis. Mild canal stenosis. C4-C5: Left greater than right facet and uncovertebral hypertrophy. Resulting moderate to severe left foraminal stenosis and mild right foraminal stenosis. Patent canal. C5-C6: 4 mm of anterolisthesis of C4 on C5. Posterior disc osteophyte complex with bilateral facet and uncovertebral hypertrophy. Resulting moderate canal stenosis and severe bilateral foraminal stenosis. C6-C7: Left greater than right facet and uncovertebral hypertrophy. Resulting severe left and moderate right foraminal stenosis. Patent canal. C7-T1: Left greater than right facet and uncovertebral hypertrophy. Resulting mild-to-moderate bilateral foraminal stenosis. Patent canal. IMPRESSION: 1.  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 Adjacent small volume of prevertebral edema which extends inferiorly to the cervicothoracic junction. 2. At C5-C6, moderate canal stenosis and severe bilateral foraminal stenosis. 3. At C6-C7, severe left and moderate right foraminal stenosis. 4. At C3-C4, severe right and moderate to severe left foraminal stenosis. 5. At C4-C5, moderate to severe left foraminal stenosis. These results will be called to the ordering clinician or representative by the Radiologist Assistant, and communication documented in the PACS or [employer]. Electronically Signed   By: Stevenson Elbe M.D.   On: 09/25/2023 00:52   CT HEAD WO CONTRAST ( ) Result Date: 09/24/2023 CLINICAL DATA:  Follow-up subarachnoid hemorrhage. EXAM: CT HEAD WITHOUT CONTRAST TECHNIQUE: Contiguous axial images were obtained from the base of the skull through the vertex without intravenous contrast. RADIATION DOSE REDUCTION: This exam was performed according to the departmental dose-optimization program which includes automated exposure control, adjustment of the mA and/or kV according to patient size and/or use of iterative reconstruction technique. COMPARISON:  CT head 09/21/2023 FINDINGS: Brain: Focal subarachnoid hemorrhage involving the sulci within the left parietal lobe are redemonstrated and appear slightly less conspicuous compared to 09/21/2023. No significant mass effect. No midline shift. Stable ventricular caliber. No new hemorrhage is noted. No evidence of acute infarct. Generalized cerebral atrophy. Vascular: No hyperdense vessel or unexpected calcification. Skull: No acute fracture. Decreased size of the left frontal scalp hematoma. Sinuses/Orbits: No acute abnormality. Other: None. IMPRESSION: Slightly decreased subarachnoid hemorrhage in the left parietal lobe compared to 09/21/2023. No new hemorrhage. Electronically Signed   By: Rozell Cornet M.D.   On: 09/24/2023 22:33   MR  SHOULDER LEFT WO CONTRAST Result Date: 09/22/2023 CLINICAL DATA:  Fall. EXAM: MRI OF THE LEFT SHOULDER WITHOUT CONTRAST TECHNIQUE: Multiplanar, multisequence MR imaging of the shoulder was performed. No intravenous contrast was administered. COMPARISON:  None Available. FINDINGS: Rotator cuff: Supraspinatus and infraspinatus tendinosis with partial-thickness articular sided insertional tearing at the junctional fibers of the posterior supraspinatus and anterior infraspinatus tendons. Teres minor tendon is intact. Subscapularis tendon is intact. Muscles: Partial-thickness tear at the origin of the acromial head of the deltoid muscle with associated intramuscular edema. No intramuscular collection. No significant muscle atrophy. Biceps Long Head: Diminutive appearance with chronic tearing of the intra-articular biceps tendon. Acromioclavicular Joint: Moderate arthropathy  of the acromioclavicular joint. Widening of the acromioclavicular joint measuring 8-10 mm with associated joint effusion and disruption of the superior acromioclavicular ligament. No subacromial/subdeltoid bursal fluid. Glenohumeral Joint: Glenohumeral joint effusion. Diffuse thinning of the glenoid articular cartilage. Labrum: Diffuse labral degeneration. Tear of the superior labrum extending through the posteroinferior labrum. Bones: No acute fracture identified. Subcortical cystic change at the greater tuberosity. No aggressive osseous lesion. IMPRESSION: 1. Widening of the acromioclavicular joint measuring 8-10 mm with disruption of the superior acromioclavicular ligament and associated joint effusion. These findings are compatible with [place] type II injury. 2. Partial-thickness tear at the origin of the acromial head of the deltoid muscle with associated grade 2 strain. 3. Diffuse labral degeneration with tear of the superior labrum extending through the posteroinferior labrum. 4. Supraspinatus and infraspinatus tendinosis with  partial-thickness articular sided insertional tearing at the junctional fibers of the posterior supraspinatus and anterior infraspinatus tendons. 5. Glenohumeral joint effusion. Electronically Signed   By: Mannie Seek M.D.   On: 09/22/2023 16:57   IR THORACENTESIS ASP PLEURAL SPACE W/IMG GUIDE Result Date: 09/22/2023 INDICATION: 83 year old male presented to ED after a fall, previous CT showed right pleural effusion. Request for therapeutic and diagnostic thoracentesis. EXAM: ULTRASOUND GUIDED RIGHT THORACENTESIS MEDICATIONS: 10 mL 1% lidocaine  COMPLICATIONS: None immediate. PROCEDURE: An ultrasound guided thoracentesis was thoroughly discussed with the patient and his wife and questions answered. Wife states that patient had thoracentesis at [facility] before. The benefits, risks, alternatives and complications were also discussed. The patient and his wife understands and wishes to proceed with the procedure. Written consent was obtained. Ultrasound was performed to localize and mark an adequate pocket of fluid in the right chest. The area was then prepped and draped in the normal sterile fashion. 1% Lidocaine  was used for local anesthesia. Under ultrasound guidance a 6 Fr Safe-T-Centesis catheter was introduced. Thoracentesis was performed. The catheter was removed and a dressing applied. FINDINGS: A total of approximately 1650 mL of hazy amber fluid was removed. Samples were sent to the laboratory as requested by the clinical team. Post procedure chest X-ray reviewed, negative for pneumothorax. IMPRESSION: Successful ultrasound guided right thoracentesis yielding 1650 mL of pleural fluid. Performed by: Aimee Han, PA-C Electronically Signed   By: Nicoletta Barrier M.D.   On: 09/22/2023 16:05   DG Chest 1 View Result Date: 09/22/2023 CLINICAL DATA:  [id number] Status post thoracentesis [id number]. EXAM: CHEST  1 VIEW COMPARISON:  09/21/2023. FINDINGS: There is small right pleural effusion with associated compressive  atelectatic changes in the right lung. There is significant interval decrease in the amount of effusion, since the prior study, status post thoracentesis. No pneumothorax. Bilateral lung fields are otherwise clear. Left lateral costophrenic angle is clear. Stable cardio-mediastinal silhouette. No acute osseous abnormalities. Right reverse shoulder arthroplasty noted. The soft tissues are within normal limits. IMPRESSION: *Significant interval decrease in the amount of right pleural effusion, status post thoracentesis. No pneumothorax. Electronically Signed   By: Beula Brunswick M.D.   On: 09/22/2023 15:44   CT CHEST ABDOMEN PELVIS W CONTRAST Result Date: 09/21/2023 CLINICAL DATA:  Trauma EXAM: CT CHEST, ABDOMEN, AND PELVIS WITH CONTRAST TECHNIQUE: Multidetector CT imaging of the chest, abdomen and pelvis was performed following the standard protocol during bolus administration of intravenous contrast. RADIATION DOSE REDUCTION: This exam was performed according to the departmental dose-optimization program which includes automated exposure control, adjustment of the mA and/or kV according to patient size and/or use of iterative reconstruction technique. CONTRAST:  75mL OMNIPAQUE  IOHEXOL  350  MG/ML SOLN COMPARISON:  CT abdomen and pelvis 05/02/2023. MRI abdomen 02/02/2023. FINDINGS: CT CHEST FINDINGS Cardiovascular: Ascending aortic aneurysm measures 4 cm. The heart is moderately enlarged. There is a trace pericardial effusion. There are atherosclerotic calcifications of the aorta. Mediastinum/Nodes: There is a peripherally calcified right thyroid  nodule measuring 1 cm. There are no enlarged mediastinal or hilar lymph nodes. The visualized esophagus is within normal limits. Lungs/Pleura: There is a large right pleural effusion which has increased from prior. There is compressive atelectasis of the right middle lobe and right lower lobe. There is minimal dependent atelectasis in the left lower lobe. The left lung is  otherwise clear. Trachea and central airways are patent. Musculoskeletal: Right shoulder arthroplasty is present. No acute fractures are seen. CT ABDOMEN PELVIS FINDINGS Hepatobiliary: Heterogeneous enhancing lesion in the liver measures 2.9 x 2.4 cm and is not well seen on delayed imaging favored as hemangioma, confirmed by prior MRI. No subcapsular fluid collection. Gallbladder and bile ducts are within normal limits. Pancreas: Unremarkable. No pancreatic ductal dilatation or surrounding inflammatory changes. Spleen: The spleen is mild-to-moderately enlarged, unchanged. Adrenals/Urinary Tract: Bilateral renal cysts are unchanged. The largest simple cyst in the right kidney measures 7.4 cm. The largest simple cyst in the left kidney measures 6.3 cm. Within the inferior pole the right kidney there is an exophytic cystic area with peripheral calcifications measuring 7.9 x 12.5 cm, similar to prior. There are numerable additional hypodensities which are too small to characterize in both kidneys, favored as cysts. There is no hydronephrosis or perinephric fluid collection. The adrenal glands and bladder are within normal limits. Stomach/Bowel: Stomach is within normal limits. Appendix appears normal. No evidence of bowel wall thickening, distention, or inflammatory changes. There is diffuse colonic diverticulosis. 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 Aorta and IVC are normal in size there are atherosclerotic calcifications of the aorta and coronary arteries. No enlarged lymph nodes are identified. Reproductive: Prostate is unremarkable. Other: No abdominal wall hernia or abnormality. No abdominopelvic ascites. Musculoskeletal: Multilevel degenerative changes affect the spine. No acute fractures are identified. IMPRESSION: 1. No acute posttraumatic sequelae in the chest, abdomen or pelvis. 2. Large right pleural effusion has increased from prior with compressive atelectasis of the right middle lobe and  right lower lobe. 3. Stable cardiomegaly. 4. 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 Recommend annual imaging followup by CTA or MRA. This recommendation follows 2010 ACCF/AHA/AATS/ACR/ASA/SCA/SCAI/SIR/STS/SVM Guidelines for the Diagnosis and Management of Patients with Thoracic Aortic Disease. Circulation. 2010; 121: Z308-M578. Aortic aneurysm NOS (ICD10-I71.9) 5. Stable splenomegaly and splenic varices. 6. 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 7. Stable bilateral renal cysts. Stable 1 cm peripherally calcified cyst in the inferior pole of the right kidney. 8. Colonic diverticulosis. Aortic Atherosclerosis (ICD10-I70.0). Electronically Signed   By: Tyron Gallon M.D.   On: 09/21/2023 17:26   DG Pelvis Portable Result Date: 09/21/2023 CLINICAL DATA:  Status post fall on blood thinners with altered mental status EXAM: PORTABLE PELVIS 2 VIEWS COMPARISON:  CT abdomen and pelvis dated 05/02/2023 FINDINGS: There is no evidence of acute displaced pelvic fracture or diastasis. No pelvic bone lesions are seen. IMPRESSION: No acute displaced pelvic fracture or diastasis. Electronically Signed   By: Limin  Xu M.D.   On: 09/21/2023 16:38   DG Chest Port 1 View Result Date: 09/21/2023 CLINICAL DATA:  Trauma EXAM: PORTABLE CHEST 1 VIEW COMPARISON:  Chest x-ray 05/04/2023 FINDINGS: There is a moderate-sized right pleural effusion. The heart is enlarged there central pulmonary vascular  congestion. The cardiac silhouette is enlarged, unchanged. Right shoulder arthroplasty is present. No acute fractures are seen. IMPRESSION: 1. Moderate-sized right pleural effusion. 2. Cardiomegaly with central pulmonary vascular congestion. Electronically Signed   By: Tyron Gallon M.D.   On: 09/21/2023 16:38   CT HEAD WO CONTRAST Addendum Date: 09/21/2023 ADDENDUM REPORT: 09/21/2023 16:05 ADDENDUM: These results were called by telephone at the time of interpretation on 09/21/2023 at 4:02 pm to provider TRAVIS YOUNG , who verbally  acknowledged these results. Electronically Signed   By: Denny Flack M.D.   On: 09/21/2023 16:05   Result Date: 09/21/2023 CLINICAL DATA:  Blunt trauma, fall. EXAM: CT HEAD WITHOUT CONTRAST TECHNIQUE: Contiguous axial images were obtained from the base of the skull through the vertex without intravenous contrast. RADIATION DOSE REDUCTION: This exam was performed according to the departmental dose-optimization program which includes automated exposure control, adjustment of the mA and/or kV according to patient size and/or use of iterative reconstruction technique. COMPARISON:  CT head 04/17/2023 FINDINGS: Brain: Focal subarachnoid hemorrhage involving sulci within the left parietal lobe. No additional areas of intracranial hemorrhage noted. No edema or mass effect. No midline shift. No CT evidence of acute infarct. Nonspecific hypoattenuation in the periventricular and subcortical white matter favored to reflect chronic microvascular ischemic changes. The basilar cisterns are patent. Ventricles: Prominence of the ventricles suggesting underlying parenchymal volume loss. Vascular: Atherosclerotic calcifications of the carotid siphons and intracranial vertebral arteries. No hyperdense vessel. Skull: No acute or aggressive finding. Orbits: Bilateral lens replacement. Scleral buckle on the right. Orbits otherwise unremarkable. Sinuses: Mild mucosal thickening in the right maxillary sinus. Other: Mastoid air cells are clear. Left frontal scalp hematoma. Additional soft tissue swelling in the left parietal scalp. Traumatic Brain Injury Risk Stratification Skull Fracture: No - Low/mBIG 1 Subdural Hematoma (SDH): No - Low Subarachnoid Hemorrhage [facility]): trace (up to 2 sulci) - Low/mBIG 1 Epidural Hematoma (EDH): No - Low/mBIG 1 Cerebral contusion, intra-axial, intraparenchymal Hemorrhage (IPH): No Intraventricular Hemorrhage (IVH): No - Low/mBIG 1 Midline Shift > 1mm or Edema/effacement of sulci/vents: No - Low/mBIG 1  ---------------------------------------------------- IMPRESSION: Focal subarachnoid hemorrhage involving a single sulcus of the left parietal lobe. No edema, mass effect, or midline shift. Left frontoparietal scalp hematomas. Chronic microvascular ischemic changes and mild to moderate parenchymal volume loss. Electronically Signed: By: Denny Flack M.D. On: 09/21/2023 15:58   CT CERVICAL SPINE WO CONTRAST Result Date: 09/21/2023 CLINICAL DATA:  Poly trauma, blunt.  Status post fall. EXAM: CT CERVICAL SPINE WITHOUT CONTRAST TECHNIQUE: Multidetector CT imaging of the cervical spine was performed without intravenous contrast. Multiplanar CT image reconstructions were also generated. RADIATION DOSE REDUCTION: This exam was performed according to the departmental dose-optimization program which includes automated exposure control, adjustment of the mA and/or kV according to patient size and/or use of iterative reconstruction technique. COMPARISON:  Cervical spine radiographs 09/18/2012. MRI cervical spine 01/14/2011. Head CT and neck CTA 04/12/2022. FINDINGS: Alignment: Straightening of the usual cervical lordosis with a mild convex right scoliosis. 4 mm of anterolisthesis at C5-6, increased from previous cervical MRI. Skull base and vertebrae: There is diffuse spondylosis with severe disc space narrowing and facet arthrosis throughout the cervical spine. There is interfacetal ankylosis bilaterally at C2-3 and on the left at C4-5. Possible interbody ankylosis at those levels as well. Asymmetric arthrosis involving the right lateral atlantoaxial joint. Suggested nondisplaced fracture of the posterior arch of C1 on the right (best seen on axial images 24 and 25 of series 4) is grossly unchanged  from previous head CT (image 1/5). No other evidence of acute cervical spine fracture or traumatic subluxation. Soft tissues and spinal canal: No prevertebral fluid or swelling. No visible canal hematoma. Disc levels: As alluded  to above, there is severe multilevel spondylosis with disc space narrowing, uncinate spurring and facet arthrosis. Findings have progressed from previous MRI and contribute to spinal stenosis and foraminal narrowing at multiple levels. Upper chest: Chest findings dictated separately. Other: Bilateral carotid atherosclerosis. IMPRESSION: 1. No evidence of acute cervical spine fracture or traumatic subluxation. 2. 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 3. No acute soft tissue abnormalities identified. Electronically Signed   By: Elmon Hagedorn M.D.   On: 09/21/2023 15:25    Microbiology: Results for orders placed or performed during the hospital encounter of 09/21/23  Culture, blood (routine x 2)     Status: Abnormal   Collection Time: 09/21/23  5:29 PM   Specimen: BLOOD RIGHT HAND  Result Value Ref Range Status   Specimen Description BLOOD RIGHT HAND  Final   Special Requests   Final    BOTTLES DRAWN AEROBIC AND ANAEROBIC Blood Culture adequate volume   Culture  Setup Time   Final    GRAM POSITIVE COCCI ANAEROBIC BOTTLE ONLY CRITICAL RESULT CALLED TO, READ BACK BY AND VERIFIED WITH: PHARMD G. ABBOTT ON [id number] @0601  BY AB    Culture (A)  Final    STAPHYLOCOCCUS CAPITIS THE SIGNIFICANCE OF ISOLATING THIS ORGANISM FROM A SINGLE SET OF BLOOD CULTURES WHEN MULTIPLE SETS ARE DRAWN IS UNCERTAIN. PLEASE NOTIFY THE MICROBIOLOGY DEPARTMENT WITHIN ONE WEEK IF SPECIATION AND SENSITIVITIES ARE REQUIRED. Performed at [facility], [address], [place], [place] [address]    Report Status 09/25/2023 FINAL  Final  Culture, blood (routine x 2)     Status: None   Collection Time: 09/21/23  5:29 PM   Specimen: BLOOD RIGHT HAND  Result Value Ref Range Status   Specimen Description BLOOD RIGHT HAND  Final   Special Requests   Final    BOTTLES DRAWN AEROBIC AND ANAEROBIC Blood Culture adequate volume   Culture   Final    NO GROWTH 5 DAYS Performed at [facility], [address], [place], [place] [address]    Report Status 09/26/2023 FINAL  Final  Blood Culture ID Panel (Reflexed)     Status: Abnormal   Collection Time: 09/21/23  5:29 PM  Result Value Ref Range Status   Enterococcus faecalis NOT DETECTED NOT DETECTED Final   Enterococcus Faecium NOT DETECTED NOT DETECTED Final   Listeria monocytogenes NOT DETECTED NOT DETECTED Final   Staphylococcus species DETECTED (A) NOT DETECTED Final    Comment: CRITICAL RESULT CALLED TO, READ BACK BY AND VERIFIED WITH: PHARMD G ABBOTT 09/23/2023 @ 0601 BY AB    Staphylococcus aureus (BCID) NOT DETECTED NOT DETECTED Final   Staphylococcus epidermidis NOT DETECTED NOT DETECTED Final   Staphylococcus lugdunensis NOT DETECTED NOT DETECTED Final   Streptococcus species NOT DETECTED NOT DETECTED Final   Streptococcus agalactiae NOT DETECTED NOT DETECTED Final   Streptococcus pneumoniae NOT DETECTED NOT DETECTED Final   Streptococcus pyogenes NOT DETECTED NOT DETECTED Final   A.calcoaceticus-baumannii NOT DETECTED NOT DETECTED Final   Bacteroides fragilis NOT DETECTED NOT DETECTED Final   Enterobacterales NOT DETECTED NOT DETECTED Final   Enterobacter cloacae complex NOT DETECTED NOT DETECTED Final   Escherichia coli NOT DETECTED NOT DETECTED Final   Klebsiella aerogenes NOT DETECTED NOT DETECTED  Final   Klebsiella oxytoca NOT DETECTED NOT DETECTED Final   Klebsiella pneumoniae NOT DETECTED NOT DETECTED Final   Proteus species NOT DETECTED NOT DETECTED Final   Salmonella species NOT DETECTED NOT DETECTED Final   Serratia marcescens NOT DETECTED NOT DETECTED Final   Haemophilus influenzae NOT DETECTED NOT DETECTED Final   Neisseria meningitidis NOT DETECTED NOT DETECTED Final   Pseudomonas aeruginosa NOT DETECTED NOT DETECTED Final   Stenotrophomonas maltophilia NOT DETECTED NOT DETECTED Final   Candida albicans NOT DETECTED NOT DETECTED Final   Candida auris NOT DETECTED NOT DETECTED Final    Candida glabrata NOT DETECTED NOT DETECTED Final   Candida krusei NOT DETECTED NOT DETECTED Final   Candida parapsilosis NOT DETECTED NOT DETECTED Final   Candida tropicalis NOT DETECTED NOT DETECTED Final   Cryptococcus neoformans/gattii NOT DETECTED NOT DETECTED Final    Comment: Performed at [facility], [address], [place], [place] [address]  Gram stain     Status: None   Collection Time: 09/22/23  3:39 PM   Specimen: Lung, Right; Pleural Fluid  Result Value Ref Range Status   Specimen Description FLUID PLEURAL RIGHT  Final   Special Requests NONE  Final   Gram Stain   Final    WBC PRESENT,BOTH PMN AND MONONUCLEAR NO ORGANISMS SEEN CYTOSPIN SMEAR Performed at [facility], [address], [place], [place] [address]    Report Status 09/22/2023 FINAL  Final  Culture, body fluid w Gram Stain-bottle     Status: None   Collection Time: 09/22/23  3:39 PM   Specimen: Fluid  Result Value Ref Range Status   Specimen Description FLUID PLEURAL RIGHT  Final   Special Requests NONE  Final   Culture   Final    NO GROWTH 5 DAYS Performed at [facility], [address] [place], [place] [address]    Report Status 09/27/2023 FINAL  Final  Culture, blood (Routine X 2) w Reflex to ID Panel     Status: None   Collection Time: 09/24/23  2:16 PM   Specimen: BLOOD RIGHT ARM  Result Value Ref Range Status   Specimen Description BLOOD RIGHT ARM  Final   Special Requests   Final    BOTTLES DRAWN AEROBIC AND ANAEROBIC Blood Culture adequate volume   Culture   Final    NO GROWTH 5 DAYS Performed at [facility], [address], [place], [place] [address]    Report Status 09/29/2023 FINAL  Final  Culture, blood (Routine X 2) w Reflex to ID Panel     Status: None   Collection Time: 09/24/23  2:16 PM   Specimen: BLOOD RIGHT HAND  Result Value Ref Range Status   Specimen Description BLOOD RIGHT HAND  Final   Special Requests   Final    BOTTLES DRAWN AEROBIC AND  ANAEROBIC Blood Culture adequate volume   Culture   Final    NO GROWTH 5 DAYS Performed at [facility], [address] [place], [place] [address]    Report Status 09/29/2023 FINAL  Final   *Note: Due to a large number of results and/or encounters for the requested time period, some results have not been displayed. A complete set of results can be found in Results Review.    Labs: CBC: Recent Labs  Lab 09/23/23 0642 09/25/23 0616 09/26/23 0547 09/27/23 0555 09/28/23 0512 09/29/23 0645  WBC 3.8* 2.1* 2.5* 2.7* 1.6* 1.2*  1.2*  NEUTROABS 2.4  --   --   --   --  0.8*  HGB 9.1* 7.2* 7.4* 7.3* 7.8* 7.4*  7.3*  HCT 29.0* 24.4* 24.5* 24.1* 26.0* 24.9*  24.8*  MCV 90.9 93.5 93.9 93.1 92.9 93.6  93.6  PLT 222 190 203 253 297 244  251   Basic Metabolic Panel: Recent Labs  Lab 09/24/23 1416 09/25/23 0616 09/25/23 2234 09/26/23 0547 09/27/23 0605 09/28/23 0512 09/29/23 0645  NA 137   < > 136 134* 137 140 138  K 4.3   < > 5.2* 5.6* 5.3* 4.8 5.1  CL 107   < > 106 107 105 111 110  CO2 17*   < > 20* 16* 19* 17* 15*  GLUCOSE 121*   < > 146* 161* 130* 125* 123*  BUN 54*   < > 60* 64* 72* 76* 80*  CREATININE 4.01*   < > 4.74* 4.76* 5.01* 4.98* 4.67*  CALCIUM  7.6*   < > 7.7* 7.6* 7.4* 7.3* 7.1*  MG 1.5*  --  2.2  --   --   --   --   PHOS  --   --   --   --  6.6* 6.3* 5.4*   < > = values in this interval not displayed.   Liver Function Tests: Recent Labs  Lab 09/27/23 0605 09/28/23 0512 09/29/23 0645  ALBUMIN  2.1* 2.1* 2.0*   CBG: Recent Labs  Lab 09/22/23 2356 09/23/23 0512 09/23/23 0754  GLUCAP 103* 116* 119*    Discharge time spent: greater than 30 minutes.  Signed: [name], MD [facility] 09/29/2023

## 2023-09-29 NOTE — Progress Notes (Signed)
 Patient had no complaints this shift. He slept well between checks. He has NS infusing at 75. No BM or nausea this shift. His grandson sat with him most of the beginning of shift. No concerns at this time.

## 2023-09-29 NOTE — Progress Notes (Signed)
 Nephrology Follow-Up Consult note   Assessment/Recommendations: [name] is a/an 83 y.o. 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    AKI on CKD IV: SCr now downtrending, likely to continue to improve. Baseline creatinine around 3-4 with significant variability.  Followed by Dr. Zelda Hickman ([facility]) in the outpatient setting and starting to initiate plans to prepare for dialysis.  Likely AKI related to dehydration and some degree of ATN from hypotension and other potential toxic exposures including contrast and vancomycin .  -No indication for MIVF from renal perspective, will defer to primary team for management -Continue to monitor daily Cr, Dose meds for GFR -Monitor Daily I/Os, Daily weight  -Maintain MAP>65 for optimal renal perfusion.  -Avoid nephrotoxic medications including NSAIDs -Use synthetic opioids (Fentanyl /Dilaudid ) if needed -No indication for HD   Hyperkalemia Resolved. Continue to monitor.   Hypotension Resolved, now normotensive on Midodrine .  Would start weaning this medication if blood pressures consistently greater than 130 and/or his kidney function starts to improve   Anemia due to CKD: Hgb 7.3, stable with prior days.  Transfusions per primary team   Fall - SAH/neck pain/shoulder pain  Per Primary and NSGY   R pleural effusion S/p thoracentesis of 1.6L, consistent with exudative effusion. Appears recurrent given prior thoracentesis in 05/2023. Defer to Primary.   Leukopenia Trending down. Defer to primary   Metabolic acidosis Decrease to CO2 15. Continue sodium bicarbonate  as ordered   Nephrology signing off.  Please follow-up as needed with any additional concerns or questions.   Jonne Netters, DO [facility], PGY-2 09/29/2023 9:12 AM  ___________________________________________________________  CC: Fall  Interval  History/Subjective:  Patient assessed at bedside, reports he is feeling okay today.  Has been drinking but still has decreased appetite.  Did have BM yesterday.  Denies any further N/V.   Medications:  Current Facility-Administered Medications  Medication Dose Route Frequency Provider Last Rate Last Admin   0.9 %  sodium chloride  infusion   Intravenous Continuous Akula, Vijaya, MD 75 mL/hr at 09/29/23 0633 New Bag at 09/29/23 8295   acetaminophen  (TYLENOL ) tablet 500 mg  500 mg Oral Q6H PRN Willadean Hark, MD   500 mg at 09/25/23 1328   Or   acetaminophen  (TYLENOL ) suppository 650 mg  650 mg Rectal Q6H PRN Willadean Hark, MD       amiodarone  (PACERONE ) tablet 200 mg  200 mg Oral Daily Claiborne, Claudia, MD   200 mg at 09/29/23 6213   bisacodyl  (DULCOLAX) suppository 10 mg  10 mg Rectal Once Rai, Ripudeep K, MD       dexamethasone  (DECADRON ) injection 10 mg  10 mg Intravenous Q24H Akula, Vijaya, MD   10 mg at 09/28/23 1757   DULoxetine  (CYMBALTA ) DR capsule 20 mg  20 mg Oral QHS Claiborne, Claudia, MD   20 mg at 09/28/23 2054   feeding supplement (ENSURE ENLIVE / ENSURE PLUS) liquid 237 mL  237 mL Oral BID BM Akula, Vijaya, MD   237 mL at 09/28/23 1320   HYDROmorphone  (DILAUDID ) injection 0.5 mg  0.5 mg Intravenous Q4H PRN Akula, Vijaya, MD   0.5 mg at 09/26/23 1247   lactulose  (CHRONULAC ) 10 GM/15ML solution 20 g  20 g Oral Once Rai, Ripudeep K, MD       levothyroxine  (SYNTHROID ) tablet 100 mcg  100 mcg Oral QAC breakfast Akula, Vijaya, MD   100 mcg at 09/29/23 0548  lidocaine  (LIDODERM ) 5 % 1 patch  1 patch Transdermal Q24H Cooper, Josseline P, PA-C   1 patch at 09/28/23 1339   melatonin tablet 3 mg  3 mg Oral QHS PRN Claiborne, Claudia, MD   3 mg at 09/24/23 2140   methocarbamol  (ROBAXIN ) tablet 500 mg  500 mg Oral Q8H PRN Akula, Vijaya, MD   500 mg at 09/26/23 2330   metoCLOPramide  (REGLAN ) injection 5 mg  5 mg Intravenous Q6H Rai, Ripudeep K, MD   5 mg at 09/29/23 0548    midodrine  (PROAMATINE ) tablet 5 mg  5 mg Oral TID WC Akula, Vijaya, MD   5 mg at 09/29/23 4098   multivitamin with minerals tablet 1 tablet  1 tablet Oral Daily Akula, Vijaya, MD   1 tablet at 09/29/23 0824   ondansetron  (ZOFRAN ) tablet 4 mg  4 mg Oral Q6H PRN Willadean Hark, MD       Or   ondansetron  (ZOFRAN ) injection 4 mg  4 mg Intravenous Q6H PRN Willadean Hark, MD   4 mg at 09/27/23 1614   pantoprazole  (PROTONIX ) injection 40 mg  40 mg Intravenous Q24H Rai, Ripudeep K, MD   40 mg at 09/28/23 1321   polyethylene glycol (MIRALAX  / GLYCOLAX ) packet 17 g  17 g Oral Daily Rai, Ripudeep K, MD       QUEtiapine  (SEROQUEL ) tablet 25 mg  25 mg Oral QHS Claiborne, Claudia, MD   25 mg at 09/28/23 2054   senna-docusate (Senokot-S) tablet 1 tablet  1 tablet Oral BID Rai, Ripudeep K, MD       simvastatin  (ZOCOR ) tablet 40 mg  40 mg Oral QHS Claiborne, Claudia, MD   40 mg at 09/28/23 2054   sodium bicarbonate  tablet 1,300 mg  1,300 mg Oral TID Peeples, Samuel J, MD   1,300 mg at 09/29/23 1191   sorbitol  70 % solution 30 mL  30 mL Oral Daily PRN Claiborne, Claudia, MD       traMADol  (ULTRAM ) tablet 50 mg  50 mg Oral Q12H PRN Akula, Vijaya, MD   50 mg at 09/26/23 4782   Facility-Administered Medications Ordered in Other Encounters  Medication Dose Route Frequency Provider Last Rate Last Admin   epoetin  alfa-epbx (RETACRIT ) injection 10,000 Units  10,000 Units Subcutaneous Once Rao, Archana C, MD       epoetin  alfa-epbx (RETACRIT ) injection 40,000 Units  40,000 Units Subcutaneous Once Avonne Boettcher, MD          Review of Systems: 10 systems reviewed and negative except per interval history/subjective  Physical Exam: Vitals:   09/29/23 0409 09/29/23 0746  BP: 117/76 119/82  Pulse: (!) 108 76  Resp: 16 19  Temp: 97.6 F (36.4 C) 98.4 F (36.9 C)  SpO2: 98% 95%   No intake/output data recorded.  Intake/Output Summary (Last 24 hours) at 09/29/2023 0912 Last data filed at 09/29/2023  0600 Gross per 24 hour  Intake 400 ml  Output 1250 ml  Net -850 ml   Constitutional: Elderly male sitting up in bed with dried blood on his forehead.  NAD. ENMT: ears and nose without scars or lesions, MMM CV: normal rate, no edema Respiratory: clear to auscultation, normal work of breathing Gastrointestinal: soft, non-tender, no palpable masses or hernias.  Foley in place, around 100 mL clear yellow urine output Skin: no visible lesions or rashes Psych: alert, judgement/insight appropriate, appropriate mood and affect   Test Results I personally reviewed new and old clinical labs and radiology tests Lab  Results  Component Value Date   NA 138 09/29/2023   K 5.1 09/29/2023   CL 110 09/29/2023   CO2 15 (L) 09/29/2023   BUN 80 (H) 09/29/2023   CREATININE 4.67 (H) 09/29/2023   CALCIUM  7.1 (L) 09/29/2023   ALBUMIN  2.0 (L) 09/29/2023   PHOS 5.4 (H) 09/29/2023    CBC Recent Labs  Lab 09/23/23 0642 09/25/23 0616 09/27/23 0555 09/28/23 0512 09/29/23 0645  WBC 3.8*   < > 2.7* 1.6* 1.2*  NEUTROABS 2.4  --   --   --   --   HGB 9.1*   < > 7.3* 7.8* 7.3*  HCT 29.0*   < > 24.1* 26.0* 24.8*  MCV 90.9   < > 93.1 92.9 93.6  PLT 222   < > 253 297 251   < > = values in this interval not displayed.

## 2023-09-29 NOTE — Progress Notes (Signed)
 Brief Oncology Note: Dx: Leukopenia, subarachnoid hemorrhage s/p injury - Low WBC 1.2 with low ANC 0.8 today 09/29/23.   - Recommend administration of GCS-F 300 mcg injection x1 today.  Noted given earlier.  Onc will follow along and recommend additional dose if indicated.  - Pt is afebrile, monitor fever curve - CIR to continue to monitor CBC with differential.   Please let Oncology know if anything else is needed.    Jacqualin Mate, NP

## 2023-09-29 NOTE — Plan of Care (Signed)
  Problem: Education: Goal: Knowledge of General Education information will improve Description: Including pain rating scale, medication(s)/side effects and non-pharmacologic comfort measures 09/29/2023 1010 by Diamond Formica, RN Outcome: Progressing 09/29/2023 1010 by Diamond Formica, RN Outcome: Progressing   Problem: Health Behavior/Discharge Planning: Goal: Ability to manage health-related needs will improve 09/29/2023 1010 by Diamond Formica, RN Outcome: Progressing 09/29/2023 1010 by Diamond Formica, RN Outcome: Progressing   Problem: Clinical Measurements: Goal: Ability to maintain clinical measurements within normal limits will improve 09/29/2023 1010 by Diamond Formica, RN Outcome: Progressing 09/29/2023 1010 by Diamond Formica, RN Outcome: Progressing Goal: Will remain free from infection 09/29/2023 1010 by Diamond Formica, RN Outcome: Progressing 09/29/2023 1010 by Diamond Formica, RN Outcome: Progressing Goal: Diagnostic test results will improve 09/29/2023 1010 by Diamond Formica, RN Outcome: Progressing 09/29/2023 1010 by Diamond Formica, RN Outcome: Progressing Goal: Respiratory complications will improve 09/29/2023 1010 by Diamond Formica, RN Outcome: Progressing 09/29/2023 1010 by Diamond Formica, RN Outcome: Progressing Goal: Cardiovascular complication will be avoided 09/29/2023 1010 by Diamond Formica, RN Outcome: Progressing 09/29/2023 1010 by Diamond Formica, RN Outcome: Progressing   Problem: Activity: Goal: Risk for activity intolerance will decrease 09/29/2023 1010 by Diamond Formica, RN Outcome: Progressing 09/29/2023 1010 by Diamond Formica, RN Outcome: Progressing   Problem: Nutrition: Goal: Adequate nutrition will be maintained 09/29/2023 1010 by Diamond Formica, RN Outcome: Progressing 09/29/2023 1010 by Diamond Formica, RN Outcome: Progressing   Problem: Coping: Goal: Level of anxiety will decrease 09/29/2023 1010 by Diamond Formica, RN Outcome: Progressing 09/29/2023 1010 by  Diamond Formica, RN Outcome: Progressing   Problem: Elimination: Goal: Will not experience complications related to bowel motility 09/29/2023 1010 by Diamond Formica, RN Outcome: Progressing 09/29/2023 1010 by Diamond Formica, RN Outcome: Progressing Goal: Will not experience complications related to urinary retention 09/29/2023 1010 by Diamond Formica, RN Outcome: Progressing 09/29/2023 1010 by Diamond Formica, RN Outcome: Progressing   Problem: Pain Managment: Goal: General experience of comfort will improve and/or be controlled 09/29/2023 1010 by Diamond Formica, RN Outcome: Progressing 09/29/2023 1010 by Diamond Formica, RN Outcome: Progressing   Problem: Safety: Goal: Ability to remain free from injury will improve 09/29/2023 1010 by Diamond Formica, RN Outcome: Progressing 09/29/2023 1010 by Diamond Formica, RN Outcome: Progressing   Problem: Education: Goal: Knowledge of disease or condition will improve 09/29/2023 1010 by Diamond Formica, RN Outcome: Progressing 09/29/2023 1010 by Diamond Formica, RN Outcome: Progressing Goal: Knowledge of secondary prevention will improve (MUST DOCUMENT ALL) 09/29/2023 1010 by Diamond Formica, RN Outcome: Progressing 09/29/2023 1010 by Diamond Formica, RN Outcome: Progressing Goal: Knowledge of patient specific risk factors will improve (DELETE if not current risk factor) 09/29/2023 1010 by Diamond Formica, RN Outcome: Progressing 09/29/2023 1010 by Diamond Formica, RN Outcome: Progressing   Problem: Ischemic Stroke/TIA Tissue Perfusion: Goal: Complications of ischemic stroke/TIA will be minimized 09/29/2023 1010 by Diamond Formica, RN Outcome: Progressing 09/29/2023 1010 by Diamond Formica, RN Outcome: Progressing   Problem: Coping: Goal: Will identify appropriate support needs 09/29/2023 1010 by Diamond Formica, RN Outcome: Progressing 09/29/2023 1010 by Diamond Formica, RN Outcome: Progressing   Problem: Self-Care: Goal: Ability to participate in self-care as  condition permits will improve 09/29/2023 1010 by Diamond Formica, RN Outcome: Progressing 09/29/2023 1010 by Diamond Formica, RN Outcome: Progressing Goal: Verbalization of feelings and concerns over difficulty with self-care will improve 09/29/2023 1010 by Diamond Formica, RN Outcome: Progressing 09/29/2023 1010 by Diamond Formica, RN Outcome: Progressing

## 2023-09-29 NOTE — Progress Notes (Signed)
 Patient not seen Dr Thelma Fire called us  this morning that pt will be transferred to rehab and if we can help with the leukopenia. He is a pt of Dr Randy Buttery, discussed with Dr Randy Buttery who suggested 1 dose of GCSF today and continued monitoring Discussed with Dr Thelma Fire. GCSF orders placed CBC with diff to be done daily while in rehab.

## 2023-09-29 NOTE — Progress Notes (Signed)
 Orthopedic Tech Progress Note Patient Details:  [name] [birth year] [id number]  Ortho Devices Type of Ortho Device: Shoulder immobilizer Ortho Device/Splint Location: LUE/ replacing soiled item Ortho Device/Splint Interventions: Ordered, Application, Adjustment   Post Interventions Patient Tolerated: Well  [name] 09/29/2023, 7:12 PM

## 2023-09-30 DIAGNOSIS — D61810 Antineoplastic chemotherapy induced pancytopenia: Secondary | ICD-10-CM

## 2023-09-30 DIAGNOSIS — S066XAD Traumatic subarachnoid hemorrhage with loss of consciousness status unknown, subsequent encounter: Secondary | ICD-10-CM | POA: Diagnosis not present

## 2023-09-30 DIAGNOSIS — D7581 Myelofibrosis: Secondary | ICD-10-CM | POA: Diagnosis not present

## 2023-09-30 DIAGNOSIS — S43102D Unspecified dislocation of left acromioclavicular joint, subsequent encounter: Secondary | ICD-10-CM

## 2023-09-30 DIAGNOSIS — T451X5A Adverse effect of antineoplastic and immunosuppressive drugs, initial encounter: Secondary | ICD-10-CM

## 2023-09-30 LAB — COMPREHENSIVE METABOLIC PANEL WITH GFR
ALT: 46 U/L — ABNORMAL HIGH (ref 0–44)
AST: 29 U/L (ref 15–41)
Albumin: 2.1 g/dL — ABNORMAL LOW (ref 3.5–5.0)
Alkaline Phosphatase: 114 U/L (ref 38–126)
Anion gap: 13 (ref 5–15)
BUN: 83 mg/dL — ABNORMAL HIGH (ref 8–23)
CO2: 16 mmol/L — ABNORMAL LOW (ref 22–32)
Calcium: 7.4 mg/dL — ABNORMAL LOW (ref 8.9–10.3)
Chloride: 109 mmol/L (ref 98–111)
Creatinine, Ser: 4.57 mg/dL — ABNORMAL HIGH (ref 0.61–1.24)
GFR, Estimated: 12 mL/min — ABNORMAL LOW (ref >60)
Glucose, Bld: 134 mg/dL — ABNORMAL HIGH (ref 70–99)
Potassium: 5.1 mmol/L (ref 3.5–5.1)
Sodium: 138 mmol/L (ref 135–145)
Total Bilirubin: 0.6 mg/dL (ref 0.0–1.2)
Total Protein: 4.5 g/dL — ABNORMAL LOW (ref 6.5–8.1)

## 2023-09-30 LAB — CBC WITH DIFFERENTIAL/PLATELET
Abs Immature Granulocytes: 0.2 10*3/uL — ABNORMAL HIGH (ref 0.00–0.07)
Basophils Absolute: 0 10*3/uL (ref 0.0–0.1)
Basophils Relative: 1 %
Eosinophils Absolute: 0 10*3/uL (ref 0.0–0.5)
Eosinophils Relative: 0 %
HCT: 24.7 % — ABNORMAL LOW (ref 39.0–52.0)
Hemoglobin: 7.6 g/dL — ABNORMAL LOW (ref 13.0–17.0)
Immature Granulocytes: 8 %
Lymphocytes Relative: 26 %
Lymphs Abs: 0.6 10*3/uL — ABNORMAL LOW (ref 0.7–4.0)
MCH: 27.8 pg (ref 26.0–34.0)
MCHC: 30.8 g/dL (ref 30.0–36.0)
MCV: 90.5 fL (ref 80.0–100.0)
Monocytes Absolute: 0.4 10*3/uL (ref 0.1–1.0)
Monocytes Relative: 15 %
Neutro Abs: 1.3 10*3/uL — ABNORMAL LOW (ref 1.7–7.7)
Neutrophils Relative %: 50 %
Platelets: 236 10*3/uL (ref 150–400)
RBC: 2.73 MIL/uL — ABNORMAL LOW (ref 4.22–5.81)
RDW: 18.6 % — ABNORMAL HIGH (ref 11.5–15.5)
Smear Review: NORMAL
WBC: 2.5 10*3/uL — ABNORMAL LOW (ref 4.0–10.5)
nRBC: 0 % (ref 0.0–0.2)

## 2023-09-30 NOTE — Progress Notes (Signed)
 PROGRESS NOTE   Subjective/Complaints:  No issues overnite , labs reviewed   Objective:   No results found. Recent Labs    09/29/23 0645 09/30/23 0235  WBC 1.2*  1.2* 2.5*  HGB 7.4*  7.3* 7.6*  HCT 24.9*  24.8* 24.7*  PLT 244  251 236   Recent Labs    09/29/23 0645 09/30/23 0235  NA 138 138  K 5.1 5.1  CL 110 109  CO2 15* 16*  GLUCOSE 123* 134*  BUN 80* 83*  CREATININE 4.67* 4.57*  CALCIUM  7.1* 7.4*    Intake/Output Summary (Last 24 hours) at 09/30/2023 0954 Last data filed at 09/30/2023 1610 Gross per 24 hour  Intake 904 ml  Output 1000 ml  Net -96 ml     Pressure Injury 09/22/23 Face Left;Upper abrasion above left eye brow (Active)  09/22/23 1940  Location: Face  Location Orientation: Left;Upper  Staging:   Wound Description (Comments): abrasion above left eye brow  Present on Admission: Yes    Physical Exam: Vital Signs Blood pressure (!) 141/88, pulse 78, temperature 97.7 F (36.5 C), temperature source Oral, resp. rate 16, weight 73 kg, SpO2 94%.  General: No acute distress Mood and affect are appropriate Heart: Regular rate and rhythm no rubs murmurs or extra sounds Lungs: Clear to auscultation, breathing unlabored, no rales or wheezes Abdomen: Positive bowel sounds, soft nontender to palpation, nondistended Extremities: No clubbing, cyanosis, or edema Skin: No evidence of breakdown, no evidence of rash Neurologic: Cranial nerves II through XII intact, motor strength is 4/5 in bilateral deltoid, bicep, tricep, grip, hip flexor, knee extensors, ankle dorsiflexor and plantar flexor  Musculoskeletal: Full range of motion in all 4 extremities. No joint swelling No tenderness to palpation Left shoulder , no pain with ROM    Assessment/Plan: 1. Functional deficits which require 3+ hours per day of interdisciplinary therapy in a comprehensive inpatient rehab setting. Physiatrist is  providing close team supervision and 24 hour management of active medical problems listed below. Physiatrist and rehab team continue to assess barriers to discharge/monitor patient progress toward functional and medical goals  Care Tool:  Bathing    Body parts bathed by patient: Right arm, Chest, Left arm, Abdomen, Front perineal area, Buttocks, Right upper leg, Left upper leg, Right lower leg, Left lower leg, Face         Bathing assist Assist Level: Moderate Assistance - Patient 50 - 74%     Upper Body Dressing/Undressing Upper body dressing   What is the patient wearing?: Pull over shirt    Upper body assist Assist Level: Supervision/Verbal cueing    Lower Body Dressing/Undressing Lower body dressing      What is the patient wearing?: Pants, Underwear/pull up     Lower body assist Assist for lower body dressing: Moderate Assistance - Patient 50 - 74%     Toileting Toileting    Toileting assist Assist for toileting: Moderate Assistance - Patient 50 - 74%     Transfers Chair/bed transfer  Transfers assist     Chair/bed transfer assist level: Minimal Assistance - Patient > 75%     Locomotion Ambulation   Ambulation assist  Walk 10 feet activity   Assist           Walk 50 feet activity   Assist           Walk 150 feet activity   Assist           Walk 10 feet on uneven surface  activity   Assist           Wheelchair     Assist               Wheelchair 50 feet with 2 turns activity    Assist            Wheelchair 150 feet activity     Assist          Blood pressure (!) 141/88, pulse 78, temperature 97.7 F (36.5 C), temperature source Oral, resp. rate 16, weight 73 kg, SpO2 94%.  Medical Problem List and Plan: 1. Functional deficits secondary to traumatic SAH fall             -patient may  shower             -ELOS/Goals: PT/OT/SLP Supervision, 10-12 days              `-Admit to CIR 2.  Antithrombotics: -DVT/anticoagulation:  Mechanical: Antiembolism stockings, thigh (TED hose) Bilateral lower extremities.  Chronic Eliquis  currently on hold due to [facility]             -antiplatelet therapy: N/A 3. Pain Management: LidoDerm  patch, Robaxin  as needed, tramadol  as needed 4. Mood/Behavior/Sleep: Provide emotional support             -antipsychotic agents: Seroquel  25 mg nightly 5. Neuropsych/cognition: This patient is capable of making decisions on his own behalf. 6. Skin/Wound Care: Routine skin checks 7. Fluids/Electrolytes/Nutrition: Routine ins and outs with follow-up chemistries 8.  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 Neurosurgery follow-up no current plan for surgical intervention however patient is currently maintained on Decadron  therapy for pain control. 9.  Recurrent large right pleural effusion.  Status post ultrasound thoracentesis 1.6 L fluid removed. 10.  PAF.  Rate controlled.  Continue amiodarone .  Eliquis  on hold due to [facility]. 11.  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  Follow-up nephrology services.  Renal ultrasound no hydronephrosis.  Currently no plans for HD. Avoid nephrotoxins. Monitor labs. Maintain MAP > 65 12.  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  Follow-up orthopedic services Dr. Abigail Abler.   13.  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  Followed by oncology services [name].  Prior to admission patient was receiving Revlimid  5 mg mouth daily for 21 days then hold for 7 days repeating every 28 days 14.  Hypothyroidism.  Synthroid  15.  Hyperlipidemia.  Zocor  16.  History of BPH.  Check PVR 17.  GERD.  Protonix  18.  Hypotension.  Followed by renal services with plan to slowly wean ProAmatine . Vitals:   09/29/23 1747 09/29/23 1933  BP: (!) 152/81  (!) 141/88  Pulse: 71 78  Resp: 18 16  Temp: 98.3 F (36.8 C) 97.7 F (36.5 C)  SpO2: 94% 94%   Supine systolic BPs mildly elevated will order ortho vitals to see if midodrine  can be reduced  19. Occasional cough, reports started couple hours ago             -Start robitussin              -  Repeat Xray chest if does not improve 20. Leukopenia- improved today              -Oncology s/p  GCSF, CBC daily    Latest Ref Rng & Units 09/30/2023    2:35 AM 09/29/2023    6:45 AM 09/28/2023    5:12 AM  CBC  WBC 4.0 - 10.5 K/uL 2.5  1.2  C   1.2  1.6   Hemoglobin 13.0 - 17.0 g/dL 7.6  7.3    7.4  7.8   Hematocrit 39.0 - 52.0 % 24.7  24.8    24.9  26.0   Platelets 150 - 400 K/uL 236  251    244  297     C Corrected result    21. L knee OA             -Xray nov 2024 indicates moderate OA             -Muscle rub prn     22.  Left AC joint disruption - Nonweightbearing left upper extremity with shoulder sling for comfort.    LOS: 1 days A FACE TO FACE EVALUATION WAS PERFORMED  Genetta Kenning 09/30/2023, 9:54 AM

## 2023-09-30 NOTE — Evaluation (Signed)
 Physical Therapy Assessment and Plan  Patient Details  Name: [name] MRN: [id number] Date of Birth: 06-Mar-1941  PT Diagnosis: Abnormal posture, Abnormality of gait, Cognitive deficits, Difficulty walking, Dizziness and giddiness, Edema, and Muscle weakness Rehab Potential: Good ELOS: 10-12 days   Today's Date: 09/30/2023 PT Individual Time: 4098-1191 PT Individual Time Calculation (min): 70 min    Hospital Problem: Principal Problem:   Traumatic subarachnoid hemorrhage (HCC)   Past Medical History:  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   right forearm, [facility]   Stroke (HCC) 11/2007   R brain subcortical infarct   Past Surgical History:  Past Surgical History:  Procedure Laterality Date   ASPIRATION / INJECTION RENAL CYST  07/08/2015   BACK SURGERY     approx 20- 25 years ago   COLONOSCOPY     EYE SURGERY     cataract both eyes   GAS INSERTION  08/11/2011   Procedure: INSERTION OF GAS;  Surgeon: Rexene Catching, MD;  Location: [facility] OR;  Service: Ophthalmology;  Laterality: Right;  C3F8   IR BONE MARROW BIOPSY & ASPIRATION  08/03/2023   IR THORACENTESIS ASP PLEURAL SPACE W/IMG GUIDE  09/22/2023   LEFT HEART CATH AND CORONARY ANGIOGRAPHY N/A 05/02/2023   Procedure: LEFT HEART CATH AND CORONARY ANGIOGRAPHY;  Surgeon: Sammy Crisp, MD;  Location: [facility] INVASIVE CV LAB;  Service: Cardiovascular;  Laterality: N/A;   REVERSE SHOULDER ARTHROPLASTY Right  04/13/2023   Procedure: REVERSE SHOULDER ARTHROPLASTY;  Surgeon: Elner Hahn, MD;  Location: [facility] ORS;  Service: Orthopedics;  Laterality: Right;   SCLERAL BUCKLE  08/11/2011   Procedure: SCLERAL BUCKLE;  Surgeon: Rexene Catching, MD;  Location: [facility] OR;  Service: Ophthalmology;  Laterality: Right;   VARICOSE VEIN SURGERY      Assessment & Plan Clinical Impression: Patient is a 83 y.o. year old male with history of atrial fibrillation maintained on Eliquis , hypertension, hyperlipidemia, hypothyroidism, chronic anemia, recurrent right pleural effusion with thoracentesis 12/24, cervical fusion C1-C5, CKD stage IV, BPH, CVA/right brain subcortical as well as myelofibrosis followed by oncology services Dr Seretha Dance diagnosed 2016 and was maintained on Revlimid  prior to admission.  Per chart review patient lives with spouse.  Split-level home with 3 steps to entry.  Modified independent with straight point cane.  Presented 09/21/2023 after being found down in the yard.  Cranial CT scan showed focal subarachnoid hemorrhage involving a single sulcus of the left parietal lobe.  No edema mass effect or midline shift.  Left frontal parietal scalp hematoma.  CT cervical spine negative for fracture.  CT of the chest abdomen pelvis showed no acute posttraumatic sequela in the chest abdomen or pelvis.  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  Patient did undergo thoracentesis for right pleural effusion with 1.6 L  yield.  MRI of the left shoulder showed widening of the acromioclavicular joint measuring 8-10 mm with disruption of the superior acromioclavicular ligament with associated joint effusion.  Supraspinatus and infraspinatus tendinosis with partial-thickness articular sided insertional tearing at the junctional fibers of the posterior supraspinatus and anterior infraspinatus tendons.  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  C5-6 moderate canal stenosis and severe bilateral foraminal stenosis as well as C6-7 severe left and moderate right foraminal stenosis with C3-4 severe right and moderate to severe left foraminal stenosis as well as C4-5.  Admission chemistries unremarkable except potassium 5.2 glucose 108 BUN 63 creatinine 3.77, hemoglobin 8.5, blood cultures Staphylococcus species.  Patient was placed on vancomycin  for positive culture suspect contaminant vancomycin /Zosyn  is since been discontinued and repeat cultures ordered negative thus far.  Orthopedic service follow-up Dr. Randal Bury in regards to left [facility] joint separation advise nonweightbearing left upper extremity with shoulder sling for comfort. Patient denies shoulder pain. He does report chronic L knee pain, worsened since hospitalization. Reports prior L knee cortisone injections.  Follow-up neurosurgery regards to small subarachnoid hemorrhage advised conservative care and Eliquis  remains on hold.  Repeat cranial CT scan without contrast showed decrease subarachnoid hemorrhage.  Neurosurgery Dr. Cabbell also followed up for cervical spondylosis and again advised conservative care/not a surgical candidate at this time and he was placed on Decadron  therapy.  Renal service is consulted for acute on chronic stage IV CKD with creatinine increasing to 4.76-5.01-4.98-4.67 from baseline of 3.5 and renal ultrasound completed showing no hydronephrosis and patient was receiving IV fluids.  No current indications for hemodialysis.  Bouts of hyperkalemia requiring Lokelma  and monitoring of chemistries.  He has had bouts of hypotension requiring ProAmatine .  Anemia of chronic disease with latest hemoglobin 7.3-7.8.  Patient did experience bouts of nausea and vomiting 09/27/2023 KUB showed mild stool burden moderate gastric distention and bowel program regulated as well as maintained on Reglan .  Palliative care was consulted  to establish goals of care.  Therapy evaluations completed due to patient's decreased functional mobility was admitted for a comprehensive rehab program.    Patient currently requires min with mobility secondary to muscle weakness, decreased cardiorespiratoy endurance, decreased coordination, and decreased standing balance, decreased postural control, decreased balance strategies, and difficulty maintaining precautions.  Prior to hospitalization, patient was modified independent  with mobility and lived with Spouse, Son in a House home.  Home access is  Ramped entrance.  Patient will benefit from skilled PT intervention to maximize safe functional mobility, minimize fall risk, and decrease caregiver burden for planned discharge home with 24 hour supervision.  Anticipate patient will benefit from follow up OP at discharge.  PT - End of Session Activity Tolerance: Tolerates 30+ min activity with multiple rests Endurance Deficit: Yes Endurance Deficit Description: SOB and required frequent rest breaks throuhgout session PT Assessment Rehab Potential (ACUTE/IP ONLY): Good PT Barriers to Discharge: Home environment access/layout;Weight bearing restrictions;Other (comments) PT Barriers to Discharge Comments: NWB LUE, split level home with steps PT Patient demonstrates impairments in the following area(s): Balance;Endurance;Nutrition;Safety;Perception;Skin Integrity;Edema PT Transfers Functional Problem(s): Bed Mobility;Bed to Chair;Car;Furniture PT Locomotion Functional Problem(s): Ambulation;Wheelchair Mobility;Stairs PT Plan PT Intensity: Minimum of 1-2 x/day ,45 to 90 minutes PT Frequency: 5 out of 7 days PT Duration Estimated Length of Stay: 10-12 days PT Treatment/Interventions: Ambulation/gait training;Discharge planning;Functional mobility training;Psychosocial support;Therapeutic Activities;Visual/perceptual remediation/compensation;Balance/vestibular training;Disease  management/prevention;Neuromuscular re-education;Skin care/wound management;Therapeutic Exercise;Wheelchair propulsion/positioning;Cognitive remediation/compensation;DME/adaptive equipment instruction;Pain management;Splinting/orthotics;UE/LE Strength taining/ROM;Community reintegration;Patient/family education;Stair training;UE/LE Coordination  activities PT Transfers Anticipated Outcome(s): Mod I with LRAD PT Locomotion Anticipated Outcome(s): supervision with LRAD PT Recommendation Follow Up Recommendations: Outpatient PT Patient destination: Home Equipment Recommended: To be determined Equipment Details: has SBQC and RW   PT Evaluation Precautions/Restrictions Precautions Precautions: Fall Required Braces or Orthoses: Sling Restrictions Weight Bearing Restrictions Per Provider Order: Yes LUE Weight Bearing Per Provider Order: Non weight bearing Other Position/Activity Restrictions: Lt shoulder NWB, gentle ROM, sling for comfort. C2 ALL injury Pain Interference Pain Interference Pain Effect on Sleep: 1. Rarely or not at all Pain Interference with Therapy Activities: 1. Rarely or not at all Pain Interference with Day-to-Day Activities: 1. Rarely or not at all Home Living/Prior Functioning Home Living Available Help at Discharge: Family;Available 24 hours/day Type of Home: House Home Access: Ramped entrance Home Layout: Multi-level Alternate Level Stairs-Number of Steps: 7 Alternate Level Stairs-Rails: Left Bathroom Shower/Tub: Walk-in shower (handicapped bars all around) Bathroom Toilet: Handicapped height Bathroom Accessibility: Yes Additional Comments: pt reports the area he will be staying inside his home does not have any stairs. Pt reports using SBQC prior to fall and has RW at home.  Lives With: Spouse;Son Prior Function Level of Independence: Requires assistive device for independence;Independent with basic ADLs;Independent with transfers  Able to Take Stairs?:  Yes Driving: Yes Vocation: Retired Leisure: Hobbies-yes (Comment) Vision/Perception  Vision - History Ability to See in Adequate Light: 0 Adequate Perception Perception: Within Functional Limits Praxis Praxis: WFL  Cognition Overall Cognitive Status: Impaired/Different from baseline Arousal/Alertness: Awake/alert Orientation Level: Oriented X4 Memory: Appears intact Awareness: Appears intact Problem Solving: Appears intact Safety/Judgment: Appears intact Comments: No formal assessment completed but cognitive deficits did not impair any functional task. Will further test next session [facility] Scales of Cognitive Functioning: Purposeful, Appropriate: Stand-by Assistance on Request Sensation Sensation Light Touch: Appears Intact Hot/Cold: Not tested Proprioception: Appears Intact Stereognosis: Not tested Coordination Gross Motor Movements are Fluid and Coordinated: No Fine Motor Movements are Fluid and Coordinated: No Coordination and Movement Description: Generalized weakness/deconditioning, R hand deficits 2/2 carpal tunnel, LUE NWB Heel Shin Test: [facility] bilaterally Motor  Motor Motor: Within Functional Limits  Trunk/Postural Assessment  Cervical Assessment Cervical Assessment: Exceptions to [facility] (forward head) Thoracic Assessment Thoracic Assessment: Within Functional Limits Lumbar Assessment Lumbar Assessment: Within Functional Limits Postural Control Postural Control: Within Functional Limits Righting Reactions: delayed  Balance Balance Balance Assessed: Yes Static Sitting Balance Static Sitting - Balance Support: Feet supported;Bilateral upper extremity supported Static Sitting - Level of Assistance: 6: Modified independent (Device/Increase time) Dynamic Sitting Balance Dynamic Sitting - Balance Support: Feet supported;No upper extremity supported Dynamic Sitting - Level of Assistance: 6: Modified independent (Device/Increase time) Static Standing  Balance Static Standing - Balance Support: Right upper extremity supported;During functional activity (LBQC) Static Standing - Level of Assistance: 4: Min assist Dynamic Standing Balance Dynamic Standing - Balance Support: Right upper extremity supported;During functional activity (LBQC) Dynamic Standing - Level of Assistance: 4: Min assist Dynamic Standing - Comments: with transfers and ambulation Extremity Assessment  RLE Assessment RLE Assessment: Exceptions to [facility] General Strength Comments: tested sitting EOB RLE Strength Right Hip Flexion: 4-/5 Right Hip ABduction: 4/5 Right Hip ADduction: 4/5 Right Knee Flexion: 4-/5 Right Knee Extension: 4-/5 Right Ankle Dorsiflexion: 4/5 Right Ankle Plantar Flexion: 4/5 LLE Assessment LLE Assessment: Exceptions to [facility] General Strength Comments: tested sitting EOB LLE Strength Left Hip Flexion: 4-/5 Left Hip ABduction: 4/5 Left Hip ADduction: 4/5 Left Knee Flexion: 4-/5 Left Knee Extension: 4-/5 Left Ankle Dorsiflexion: 4/5  Left Ankle Plantar Flexion: 4/5  Care Tool Care Tool Bed Mobility Roll left and right activity   Roll left and right assist level: Supervision/Verbal cueing    Sit to lying activity   Sit to lying assist level: Supervision/Verbal cueing    Lying to sitting on side of bed activity   Lying to sitting on side of bed assist level: the ability to move from lying on the back to sitting on the side of the bed with no back support.: Minimal Assistance - Patient > 75%     Care Tool Transfers Sit to stand transfer   Sit to stand assist level: Moderate Assistance - Patient 50 - 74%    Chair/bed transfer   Chair/bed transfer assist level: Minimal Assistance - Patient > 75%    Car transfer   Car transfer assist level: Minimal Assistance - Patient > 75%      Care Tool Locomotion Ambulation   Assist level: Minimal Assistance - Patient > 75% Assistive device: Cane-quad Max distance: 37ft  Walk 10 feet activity    Assist level: Minimal Assistance - Patient > 75% Assistive device: Cane-quad   Walk 50 feet with 2 turns activity   Assist level: Minimal Assistance - Patient > 75% Assistive device: Cane-quad  Walk 150 feet activity Walk 150 feet activity did not occur: Safety/medical concerns (fatigue)      Walk 10 feet on uneven surfaces activity   Assist level: Minimal Assistance - Patient > 75% Assistive device: Other (comment);Cane-quad (L handrail)  Stairs   Assist level: Minimal Assistance - Patient > 75% Stairs assistive device: 2 hand rails Max number of stairs: 8 (6in)  Walk up/down 1 step activity   Walk up/down 1 step (curb) assist level: Minimal Assistance - Patient > 75% Walk up/down 1 step or curb assistive device: 2 hand rails  Walk up/down 4 steps activity   Walk up/down 4 steps assist level: Minimal Assistance - Patient > 75% Walk up/down 4 steps assistive device: 2 hand rails  Walk up/down 12 steps activity Walk up/down 12 steps activity did not occur: Safety/medical concerns (fatigue)      Pick up small objects from floor Pick up small object from the floor (from standing position) activity did not occur: Safety/medical concerns (fatigue, decreased balance)      Wheelchair Is the patient using a wheelchair?: Yes Type of Wheelchair: Manual   Wheelchair assist level: Dependent - Patient 0% Max wheelchair distance: 168ft  Wheel 50 feet with 2 turns activity   Assist Level: Dependent - Patient 0%  Wheel 150 feet activity   Assist Level: Dependent - Patient 0%    Refer to Care Plan for Long Term Goals  SHORT TERM GOAL WEEK 1 PT Short Term Goal 1 (Week 1): pt will perform all transfers with LRAD and CGA PT Short Term Goal 2 (Week 1): pt will ambulate 17ft with LRAD and CGA PT Short Term Goal 3 (Week 1): pt will navigate 4 6in steps with 1 handrail and CGA  Recommendations for other services: None   Skilled Therapeutic Intervention Evaluation completed (see details  above and below) with education on PT POC and goals and individual treatment initiated with focus on functional mobility/transfers, toileting, generalized strengthening and endurance,dynamic standing balance/coordination, and ambulation. Received pt semi-reclined in bed with RN and family at bedside. Pt educated on PT evaluation, CIR policies, and therapy schedule and agreeable. Pt denied any pain during session.   Pt transferred semi-reclined<>sitting L EOB with HOB elevated (  to simulate bed at home) and min A for trunk control with cues for adherence to LUE NWB precautions; noted poor carry over with cues. Pt reported urge to void and stood with [place] and min A (pt attempting to push up with LUE, requiring cues for correction) and ambulated in/out of bathroom with [facility] and min A with assist to manage IV pole. Pt able to manage clothing with min A and continent of bowel and bladder. Performed hygiene management without assist, then sat in [facility] at sink and performed hand hygiene without assist.   Pt transported to/from room in [facility] dependently for time management purposes. Pt navigated 8 6in steps with 2 handrails when ascending and L handrail when descending with min A using a step to pattern. Pt then ambulated 59ft with [facility] and min A with assist to manage IV pole. Pt SOB afterwards - SPO2 92% and HR 121bpm; symptoms improved with rest. Pt then performed simulated car transfer with [facility] and min A, then ambulated 80ft on uneven surfaces (ramp) with LBQC and L handrail and min A. Returned to room and transferred WC<>bed via stand<>pivot with [facility] and min A and transitioned into supine with supervision. Concluded session with pt semi-reclined in bed, needs within reach, and bed alarm on. Assisted pt with ordering lunch and updated safety plan.   Mobility Bed Mobility Bed Mobility: Rolling Right;Rolling Left;Sit to Supine;Supine to Sit Rolling Right: Supervision/verbal cueing Rolling Left: Supervision/Verbal  cueing Supine to Sit: Minimal Assistance - Patient > 75% Sit to Supine: Supervision/Verbal cueing Transfers Transfers: Sit to Stand;Stand to Sit;Stand Pivot Transfers Sit to Stand: Moderate Assistance - Patient 50-74% Stand to Sit: Minimal Assistance - Patient > 75% Stand Pivot Transfers: Minimal Assistance - Patient > 75% Stand Pivot Transfer Details: Verbal cues for sequencing;Verbal cues for technique Transfer (Assistive device): Large base quad cane Locomotion  Gait Ambulation: Yes Gait Assistance: Minimal Assistance - Patient > 75% Gait Distance (Feet): 70 Feet Assistive device: Large base quad cane Gait Assistance Details: Verbal cues for technique;Verbal cues for precautions/safety Gait Assistance Details: verbal cues for every conservation Gait Gait: Yes Gait Pattern: Impaired Gait Pattern: Step-to pattern;Step-through pattern;Decreased step length - right;Decreased step length - left;Antalgic;Poor foot clearance - left;Poor foot clearance - right;Narrow base of support Gait velocity: decreased Stairs / Additional Locomotion Stairs: Yes Stairs Assistance: Minimal Assistance - Patient > 75% Stair Management Technique: Two rails;Forwards;Step to pattern Number of Stairs: 8 Height of Stairs: 6 Ramp: Minimal Assistance - Patient >75% (LBQC and L handrail) Wheelchair Mobility Wheelchair Mobility: Yes Wheelchair Assistance: Dependent - Patient 0% Wheelchair Parts Management: Needs assistance Distance: 120ft   Discharge Criteria: Patient will be discharged from PT if patient refuses treatment 3 consecutive times without medical reason, if treatment goals not met, if there is a change in medical status, if patient makes no progress towards goals or if patient is discharged from hospital.  The above assessment, treatment plan, treatment alternatives and goals were discussed and mutually agreed upon: by patient  Everrett Hodgkin PT, DPT 09/30/2023, 12:09 PM

## 2023-09-30 NOTE — Plan of Care (Signed)
 ABS discontinued d/t ABS score less than 20 for the last three days or no behaviors present. No special considerations for a behavior plan needed, continue usual policies of bed and chair alarm.

## 2023-09-30 NOTE — Evaluation (Signed)
 Occupational Therapy Assessment and Plan  Patient Details  Name: [name] MRN: [id number] Date of Birth: 08-14-1940  OT Diagnosis: muscle weakness (generalized) and pain in joint Rehab Potential: Rehab Potential (ACUTE ONLY): Good ELOS: 10-12 days   Today's Date: 09/30/2023 OT Individual Time: 5956-3875 OT Individual Time Calculation (min): 76 min     Hospital Problem: Principal Problem:   Traumatic subarachnoid hemorrhage (HCC)   Past Medical History:  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   right forearm, [facility]   Stroke (HCC) 11/2007   R brain subcortical infarct   Past Surgical History:  Past Surgical History:  Procedure Laterality Date   ASPIRATION / INJECTION RENAL CYST  07/08/2015   BACK SURGERY     approx 20- 25 years ago   COLONOSCOPY     EYE SURGERY     cataract both eyes   GAS INSERTION  08/11/2011   Procedure: INSERTION OF GAS;  Surgeon: Rexene Catching, MD;  Location: [facility] OR;  Service: Ophthalmology;  Laterality: Right;  C3F8   IR BONE MARROW BIOPSY & ASPIRATION  08/03/2023   IR THORACENTESIS ASP PLEURAL SPACE W/IMG GUIDE  09/22/2023   LEFT HEART CATH AND CORONARY ANGIOGRAPHY N/A 05/02/2023   Procedure: LEFT HEART CATH AND CORONARY ANGIOGRAPHY;  Surgeon: Sammy Crisp, MD;  Location: [facility] INVASIVE CV LAB;  Service: Cardiovascular;  Laterality: N/A;   REVERSE SHOULDER ARTHROPLASTY Right 04/13/2023   Procedure: REVERSE SHOULDER  ARTHROPLASTY;  Surgeon: Elner Hahn, MD;  Location: [facility] ORS;  Service: Orthopedics;  Laterality: Right;   SCLERAL BUCKLE  08/11/2011   Procedure: SCLERAL BUCKLE;  Surgeon: Rexene Catching, MD;  Location: [facility] OR;  Service: Ophthalmology;  Laterality: Right;   VARICOSE VEIN SURGERY      Assessment & Plan Clinical Impression:[name] is a 83 year old right-handed male with history of atrial fibrillation maintained on Eliquis , hypertension, hyperlipidemia, hypothyroidism, chronic anemia, recurrent right pleural effusion with thoracentesis 12/24, cervical fusion C1-C5, CKD stage IV, BPH, CVA/right brain subcortical as well as myelofibrosis followed by oncology services Dr Seretha Dance diagnosed 2016 and was maintained on Revlimid  prior to admission.  Per chart review patient lives with spouse.  Split-level home with 3 steps to entry.  Modified independent with straight point cane.  Presented 09/21/2023 after being found down in the yard.  Cranial CT scan showed focal subarachnoid hemorrhage involving a single sulcus of the left parietal lobe.  No edema mass effect or midline shift.  Left frontal parietal scalp hematoma.  CT cervical spine negative for fracture.  CT of the chest abdomen pelvis showed no acute posttraumatic sequela in the chest abdomen or pelvis.  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  Patient did undergo thoracentesis for right pleural effusion with 1.6 L yield.  MRI of the  left shoulder showed widening of the acromioclavicular joint measuring 8-10 mm with disruption of the superior acromioclavicular ligament with associated joint effusion.  Supraspinatus and infraspinatus tendinosis with partial-thickness articular sided insertional tearing at the junctional fibers of the posterior supraspinatus and anterior infraspinatus tendons.  MRI cervical spine showed disruption of the anterior  longitudinal ligament at C2.  C5-6 moderate canal stenosis and severe bilateral foraminal stenosis as well as C6-7 severe left and moderate right foraminal stenosis with C3-4 severe right and moderate to severe left foraminal stenosis as well as C4-5.  Admission chemistries unremarkable except potassium 5.2 glucose 108 BUN 63 creatinine 3.77, hemoglobin 8.5, blood cultures Staphylococcus species.  Patient was placed on vancomycin  for positive culture suspect contaminant vancomycin /Zosyn  is since been discontinued and repeat cultures ordered negative thus far.  Orthopedic service follow-up Dr. Randal Bury in regards to left [facility] joint separation advise nonweightbearing left upper extremity with shoulder sling for comfort. Patient denies shoulder pain. He does report chronic L knee pain, worsened since hospitalization. Reports prior L knee cortisone injections.  Follow-up neurosurgery regards to small subarachnoid hemorrhage advised conservative care and Eliquis  remains on hold.  Repeat cranial CT scan without contrast showed decrease subarachnoid hemorrhage.  Neurosurgery Dr. Cabbell also followed up for cervical spondylosis and again advised conservative care/not a surgical candidate at this time and he was placed on Decadron  therapy.  Renal service is consulted for acute on chronic stage IV CKD with creatinine increasing to 4.76-5.01-4.98-4.67 from baseline of 3.5 and renal ultrasound completed showing no hydronephrosis and patient was receiving IV fluids.  No current indications for hemodialysis.  Bouts of hyperkalemia requiring Lokelma  and monitoring of chemistries.  He has had bouts of hypotension requiring ProAmatine .  Anemia of chronic disease with latest hemoglobin 7.3-7.8.  Patient did experience bouts of nausea and vomiting 09/27/2023 KUB showed mild stool burden moderate gastric distention and bowel program regulated as well as maintained on Reglan .  Palliative care was consulted to establish goals of  care.  Therapy evaluations completed due to patient's decreased functional mobility was admitted for a comprehensive rehab program.     Patient transferred to CIR on 09/29/2023 .    Patient currently requires mod with basic self-care skills secondary to muscle weakness, decreased cardiorespiratoy endurance, and decreased standing balance, decreased postural control, and decreased balance strategies.  Prior to hospitalization, patient could complete ADLs with modified independent .  Patient will benefit from skilled intervention to increase independence with basic self-care skills prior to discharge home with care partner.  Anticipate patient will require 24 hour supervision and follow up outpatient.  OT - End of Session Activity Tolerance: Tolerates 10 - 20 min activity with multiple rests Endurance Deficit: Yes Endurance Deficit Description: generalized deconditioning OT Assessment Rehab Potential (ACUTE ONLY): Good OT Patient demonstrates impairments in the following area(s): Balance;Motor;Endurance;Safety OT Basic ADL's Functional Problem(s): Bathing;Dressing;Toileting OT Transfers Functional Problem(s): Toilet;Tub/Shower OT Additional Impairment(s): None OT Plan OT Intensity: Minimum of 1-2 x/day, 45 to 90 minutes OT Frequency: 5 out of 7 days OT Duration/Estimated Length of Stay: 10-12 days OT Treatment/Interventions: Balance/vestibular training;Discharge planning;Pain management;Self Care/advanced ADL retraining;Therapeutic Activities;UE/LE Coordination activities;Disease mangement/prevention;Functional mobility training;Patient/family education;Therapeutic Exercise;Community reintegration;DME/adaptive equipment instruction;UE/LE Strength taining/ROM OT Self Feeding Anticipated Outcome(s): no goal OT Basic Self-Care Anticipated Outcome(s): (S)- mod I OT Toileting Anticipated Outcome(s): (S) OT Bathroom Transfers Anticipated Outcome(s): (S) OT Recommendation Recommendations for Other  Services: Neuropsych consult Patient destination: Home Follow Up Recommendations: Outpatient OT Equipment Recommended: To be determined   OT  Evaluation Precautions/Restrictions  Precautions Precautions: Fall Required Braces or Orthoses: Sling Restrictions Weight Bearing Restrictions Per Provider Order: Yes LUE Weight Bearing Per Provider Order: Non weight bearing Other Position/Activity Restrictions: Lt shoulder NWB, gentl ROM, sling for comfort. C2 ALL injury General Chart Reviewed: Yes Family/Caregiver Present: No Home Living/Prior Functioning Home Living Family/patient expects to be discharged to:: Private residence Living Arrangements: Spouse/significant other, Children Available Help at Discharge: Family, Available 24 hours/day Type of Home: House Home Access: Ramped entrance Home Layout: Multi-level Alternate Level Stairs-Number of Steps: 7 Alternate Level Stairs-Rails: Right Bathroom Shower/Tub: Walk-in shower (handicapped bars all around) Bathroom Toilet: Handicapped height Bathroom Accessibility: Yes Additional Comments: Owns a RW, wide base QC  Lives With: Spouse IADL History Homemaking Responsibilities: No Current License: Yes Mode of Transportation: Car 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" Leisure and Hobbies: [employer] Prior Function Level of Independence: Requires assistive device for independence, Independent with basic ADLs, Independent with transfers Driving: Yes Vocation: Retired Leisure: Hobbies-yes (Comment) Vision Baseline Vision/History: 0 No visual deficits Ability to See in Adequate Light: 0 Adequate Patient Visual Report: No change from baseline Vision Assessment?: No apparent visual deficits Perception  Perception: Within Functional Limits Praxis Praxis: WFL Cognition Cognition Overall Cognitive Status: Impaired/Different from baseline Arousal/Alertness:  Awake/alert Orientation Level: Person;Place;Situation Person: Oriented Place: Oriented Situation: Oriented Memory: Appears intact Safety/Judgment: Appears intact Comments: No formal assessment completed but cognitive deficits did not impair any functional task. Will further test next session [facility] Scales of Cognitive Functioning: Purposeful, Appropriate: Stand-by Assistance on Request Brief Interview for Mental Status (BIMS) Repetition of Three Words (First Attempt): 3 Temporal Orientation: Year: Correct Temporal Orientation: Month: Accurate within 5 days Temporal Orientation: Day: Correct Recall: "Sock": Yes, after cueing ("something to wear") Recall: "Blue": Yes, no cue required Recall: "Bed": Yes, no cue required BIMS Summary Score: 14 Sensation Sensation Light Touch: Appears Intact Hot/Cold: Appears Intact Coordination Gross Motor Movements are Fluid and Coordinated: No Fine Motor Movements are Fluid and Coordinated: No Coordination and Movement Description: Generalized weakness, R hand deficits 2/2 carpal tunnel, LUE NWB all impair Motor  Motor Motor: Within Functional Limits  Trunk/Postural Assessment  Cervical Assessment Cervical Assessment: Exceptions to [facility] (forward head) Thoracic Assessment Thoracic Assessment: Within Functional Limits Lumbar Assessment Lumbar Assessment: Within Functional Limits Postural Control Postural Control: Deficits on evaluation Righting Reactions: delayed  Balance Balance Balance Assessed: Yes Static Sitting Balance Static Sitting - Balance Support: Feet supported Static Sitting - Level of Assistance: 6: Modified independent (Device/Increase time) Dynamic Sitting Balance Dynamic Sitting - Balance Support: Feet supported Dynamic Sitting - Level of Assistance: 6: Modified independent (Device/Increase time) Static Standing Balance Static Standing - Balance Support: During functional activity;Right upper extremity  supported Static Standing - Level of Assistance: 4: Min assist Dynamic Standing Balance Dynamic Standing - Balance Support: During functional activity;Right upper extremity supported Dynamic Standing - Level of Assistance: 4: Min assist Extremity/Trunk Assessment RUE Assessment RUE Assessment: Exceptions to [facility] General Strength Comments: Awaiting carpal tunnel sx,gross grasp is weak LUE Assessment LUE Assessment: Exceptions to [facility] General Strength Comments: Limited by NWB status but is able to lift arm to 90 degrees with no pain, and use LUE functionally during toileting LUE Body System: Ortho  Care Tool Care Tool Self Care Eating   Eating Assist Level: Set up assist    Oral Care    Oral Care Assist Level: Set up assist    Bathing   Body  parts bathed by patient: Right arm;Chest;Left arm;Abdomen;Front perineal area;Buttocks;Right upper leg;Left upper leg;Right lower leg;Left lower leg;Face     Assist Level: Moderate Assistance - Patient 50 - 74%    Upper Body Dressing(including orthotics)   What is the patient wearing?: Pull over shirt   Assist Level: Supervision/Verbal cueing    Lower Body Dressing (excluding footwear)   What is the patient wearing?: Pants;Underwear/pull up Assist for lower body dressing: Moderate Assistance - Patient 50 - 74%    Putting on/Taking off footwear   What is the patient wearing?: Non-skid slipper socks Assist for footwear: Moderate Assistance - Patient 50 - 74%       Care Tool Toileting Toileting activity   Assist for toileting: Moderate Assistance - Patient 50 - 74%     Care Tool Bed Mobility Roll left and right activity   Roll left and right assist level: Supervision/Verbal cueing    Sit to lying activity   Sit to lying assist level: Supervision/Verbal cueing    Lying to sitting on side of bed activity   Lying to sitting on side of bed assist level: the ability to move from lying on the back to sitting on the side of the bed with  no back support.: Supervision/Verbal cueing     Care Tool Transfers Sit to stand transfer   Sit to stand assist level: Moderate Assistance - Patient 50 - 74%    Chair/bed transfer   Chair/bed transfer assist level: Minimal Assistance - Patient > 75%     Toilet transfer   Assist Level: Moderate Assistance - Patient 50 - 74%     Care Tool Cognition  Expression of Ideas and Wants Expression of Ideas and Wants: 4. Without difficulty (complex and basic) - expresses complex messages without difficulty and with speech that is clear and easy to understand  Understanding Verbal and Non-Verbal Content Understanding Verbal and Non-Verbal Content: 4. Understands (complex and basic) - clear comprehension without cues or repetitions   Memory/Recall Ability Memory/Recall Ability : Current season;Location of own room;Staff names and faces;That he or she is in a hospital/hospital unit   Refer to Care Plan for Long Term Goals  SHORT TERM GOAL WEEK 1 OT Short Term Goal 1 (Week 1): STG= LTG d/t ELOS  Recommendations for other services: Neuropsych   Skilled Therapeutic Intervention ADL ADL Eating: Supervision/safety Where Assessed-Eating: Chair Grooming: Supervision/safety Where Assessed-Grooming: Sitting at sink Upper Body Bathing: Supervision/safety Where Assessed-Upper Body Bathing: Sitting at sink Lower Body Bathing: Minimal assistance Where Assessed-Lower Body Bathing: Sitting at sink Upper Body Dressing: Supervision/safety Where Assessed-Upper Body Dressing: Sitting at sink Lower Body Dressing: Moderate assistance Where Assessed-Lower Body Dressing: Sitting at sink Toileting: Moderate assistance Where Assessed-Toileting: Teacher, adult education: Minimal Dentist Method: Ambulating Mobility  Bed Mobility Bed Mobility: Supine to Sit;Sit to Supine Supine to Sit: Supervision/Verbal cueing Sit to Supine: Supervision/Verbal cueing Transfers Sit to Stand: Moderate  Assistance - Patient 50-74% Stand to Sit: Moderate Assistance - Patient 50-74%   Skilled OT evaluation completed with the creation of pt centered OT POC. Pt educated on condition, ELOS, rehab expectations, and fall risk reduction strategies throughout session. He completed ADLs sink side d/t IV running prohibiting shower. Pt occasionally reporting dizziness but BP remainder around 145-155 systolic and 90 to 96 diastolic. He completed toileting tasks in standing once OT removed condom cath with min A. He required mod A to stand from w/c d/t UE limitations from both LUE NWB and weak R gross grasp  d/t carpal tunnel syndrome ( release scheduled). Once on his feet he required min A overall. Pt left supine at close of session, all needs met, bed alarm set.    BP EOB 155/90, HR 116 bpm  Discharge Criteria: Patient will be discharged from OT if patient refuses treatment 3 consecutive times without medical reason, if treatment goals not met, if there is a change in medical status, if patient makes no progress towards goals or if patient is discharged from hospital.  The above assessment, treatment plan, treatment alternatives and goals were discussed and mutually agreed upon: by patient  Una Ganser 09/30/2023, 10:12 AM

## 2023-09-30 NOTE — Progress Notes (Signed)
 Occupational Therapy TBI Note  Patient Details  Name: [name] MRN: [id number] Date of Birth: 13-Oct-1940  Today's Date: 09/30/2023 OT Individual Time: 1401-1500 OT Individual Time Calculation (min): 59 min    Short Term Goals: Week 1:  OT Short Term Goal 1 (Week 1): STG= LTG d/t ELOS  Skilled Therapeutic Interventions/Progress Updates:    Pt received supine with no c/o pain, agreeable to OT session. He came to EOB with (S). Sit > stand with min A from EOB. Stand pivot to the w/c with min A. Pt was taken via w/c to the therapy gym for time management. Pt completed blocked practice sit <> stand, 3x8 repetitions with no UE support, to challenge generalized strengthening for ADL transfers, as well as increasing functional activity tolerance and cardiorespiratory endurance. Pt required min A overall and cueing for body mechanics. He complained of fatigue throughout session. Patient required increased time for initiation, cuing, rest breaks, and for completion of tasks throughout session. Utilized therapeutic use of self throughout to promote efficiency. He was tachy during mobility- up to 120 bpm and during recovery fluctuated between 110-118 bpm despite being seated and resting. SpO2 remained stable at 96% on RA. He completed 2 trials of 100 ft of functional mobility with a large base quad cane. He ended with reciprocal LE tapping onto a 4 in step with cane, completed to challenge dynamic standing balance for reduced fall risk. Min A provided with several large trunk perturbations. Pt returned to his room and was left supine with all needs met. Bed alarm set.   Therapy Documentation Precautions:  Precautions Precautions: Fall Required Braces or Orthoses: Sling Restrictions Weight Bearing Restrictions Per Provider Order: Yes LUE Weight Bearing Per Provider Order: Non weight bearing Other Position/Activity Restrictions: Lt shoulder NWB, gentle ROM, sling for comfort. C2 ALL  injury  Therapy/Group: Individual Therapy  Una Ganser 09/30/2023, 2:16 PM

## 2023-09-30 NOTE — Discharge Instructions (Addendum)
 Inpatient Rehab Discharge Instructions  [name] Discharge date and time: No discharge date for patient encounter.   Activities/Precautions/ Functional Status: Activity: Weightbearing as tolerated with shoulder sling for comfort Diet: regular Wound Care: Routine skin checks Functional status:  ___ No restrictions     ___ Walk up steps independently ___ 24/7 supervision/assistance   ___ Walk up steps with assistance ___ Intermittent supervision/assistance  ___ Bathe/dress independently ___ Walk with walker     _x__ Bathe/dress with assistance ___ Walk Independently    ___ Shower independently ___ Walk with assistance    ___ Shower with assistance ___ No alcohol     ___ Return to work/school ________  Special Instructions: No driving smoking or alcohol  Continue to Eliquis  until further notice   My questions have been answered and I understand these instructions. I will adhere to these goals and the provided educational materials after my discharge from the hospital.  Patient/Caregiver Signature _______________________________ Date __________  Clinician Signature _______________________________________ Date __________  Please bring this form and your medication list with you to all your follow-up doctor's appointments.

## 2023-09-30 NOTE — Discharge Summary (Addendum)
 Physician Discharge Summary  Patient ID: [name] MRN: [id number] DOB/AGE: 83-May-1942 83 y.o.  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 Right lower lobe consolidation. Right small pleural effusion Prominence interstitial markings left lung. Findings could correlate with congestive changes superimposed right lower lobe pneumonia and right pleural effusion Total right shoulder arthroplasty IMPRESSION: Worsening right lung infiltrates. Right lower lobe consolidation. Right small pleural effusion. Findings could correlate with congestive changes superimposed right lower lobe pneumonia and right pleural effusion. Electronically Signed   By: Fredrich Jefferson M.D.   On: 10/16/2023 11:38   VAS US  LOWER EXTREMITY VENOUS (DVT) Result Date: 10/07/2023  Lower Venous DVT Study Patient Name:  [name]  Date of Exam:   10/06/2023 Medical Rec #: [id number]          Accession #:    [id number] Date of Birth: 08/31/1940          Patient Gender: M Patient Age:   83 years Exam Location:  [facility] Procedure:      VAS US   LOWER EXTREMITY VENOUS (DVT) Referring Phys: Cherri Corns --------------------------------------------------------------------------------  Indications: Edema. Other Indications: Rehab. Comparison Study: Previous exam on 05/03/2023 was negaitve for DVT Performing Technologist: Arlyce Berger RVT, RDMS  Examination Guidelines: A complete evaluation includes B-mode imaging, spectral Doppler, color Doppler, and power Doppler as needed of all accessible portions of each vessel. Bilateral testing is considered an integral part of a complete examination. Limited examinations for reoccurring indications may be performed as noted. The reflux portion of the exam is performed with the patient in reverse Trendelenburg.  +---------+---------------+---------+-----------+----------+--------------+ RIGHT    CompressibilityPhasicitySpontaneityPropertiesThrombus Aging +---------+---------------+---------+-----------+----------+--------------+ CFV      Full           Yes      Yes                                 +---------+---------------+---------+-----------+----------+--------------+ SFJ      Full                                                        +---------+---------------+---------+-----------+----------+--------------+ FV Prox  Full           Yes      Yes                                 +---------+---------------+---------+-----------+----------+--------------+ FV Mid   Full           Yes      Yes                                 +---------+---------------+---------+-----------+----------+--------------+  FV DistalFull           Yes      Yes                                 +---------+---------------+---------+-----------+----------+--------------+ PFV      Full                                                        +---------+---------------+---------+-----------+----------+--------------+ POP      Full           Yes      Yes                                  +---------+---------------+---------+-----------+----------+--------------+ PTV      Full                                                        +---------+---------------+---------+-----------+----------+--------------+ PERO     Full                                                        +---------+---------------+---------+-----------+----------+--------------+   +---------+---------------+---------+-----------+----------+--------------+ LEFT     CompressibilityPhasicitySpontaneityPropertiesThrombus Aging +---------+---------------+---------+-----------+----------+--------------+ CFV      Full           Yes      Yes                                 +---------+---------------+---------+-----------+----------+--------------+ SFJ      Full                                                        +---------+---------------+---------+-----------+----------+--------------+ FV Prox  Full           Yes      Yes                                 +---------+---------------+---------+-----------+----------+--------------+ FV Mid   Full           Yes      Yes                                 +---------+---------------+---------+-----------+----------+--------------+ FV DistalFull           Yes      Yes                                 +---------+---------------+---------+-----------+----------+--------------+ PFV      Full                                                        +---------+---------------+---------+-----------+----------+--------------+  POP      Full           Yes      Yes                                 +---------+---------------+---------+-----------+----------+--------------+ PTV      Full                                                        +---------+---------------+---------+-----------+----------+--------------+ PERO     Full                                                         +---------+---------------+---------+-----------+----------+--------------+     Summary: BILATERAL: - No evidence of deep vein thrombosis seen in the lower extremities, bilaterally. -No evidence of popliteal cyst, bilaterally. -Subcutaneous edema, bilaterally.  *See table(s) above for measurements and observations. Electronically signed by Delaney Fearing on 10/07/2023 at 11:38:15 AM.    Final    DG Chest 2 View Result Date: 10/06/2023 CLINICAL DATA:  142230 Pleural effusion 142230 EXAM: CHEST - 2 VIEW COMPARISON:  October 03, 2023 FINDINGS: Small right pleural effusion with right basilar airspace disease, unchanged. Unchanged cardiomegaly. Tortuous aorta with aortic atherosclerosis. No acute fracture or destructive lesions. Multilevel thoracic osteophytosis. Partially visualized right shoulder arthroplasty. IMPRESSION: Unchanged small right pleural effusion with right basilar atelectasis. Electronically Signed   By: Rance Burrows M.D.   On: 10/06/2023 12:24   DG CHEST PORT 1 VIEW Result Date: 10/03/2023 CLINICAL DATA:  142230 Pleural effusion 142230 EXAM: PORTABLE CHEST - 1 VIEW COMPARISON:  October 02, 2023 FINDINGS: Small right pleural effusion with right basilar atelectasis. Moderate cardiomegaly. Tortuous aorta with aortic atherosclerosis. No acute fracture or destructive lesions. Multilevel thoracic osteophytosis. Right glenohumeral joint arthroplasty. IMPRESSION: 1. Worsening, small right pleural effusion with right basilar atelectasis. 2. The nodular opacity in the right midlung on the prior radiograph is not well visualized. Electronically Signed   By: Rance Burrows M.D.   On: 10/03/2023 14:39   DG Chest 1 View Result Date: 10/02/2023 CLINICAL DATA:  Status post thoracentesis EXAM: CHEST  1 VIEW COMPARISON:  10/02/2023 FINDINGS: Normal mediastinum and cardiac silhouette. Normal pulmonary vasculature. No evidence of effusion, infiltrat no evidence of pneumothorax following RIGHT thoracentesis. RIGHT mid  chest opacity again noted. There is volume loss in the RIGHT hemithorax similar to prior. IMPRESSION: No pneumothorax following thoracentesis. Electronically Signed   By: Deboraha Fallow M.D.   On: 10/02/2023 15:59   IR THORACENTESIS ASP PLEURAL SPACE W/IMG GUIDE Result Date: 10/02/2023 INDICATION: 83 year old male with recurrent right pleural effusion. Request for therapeutic thoracentesis. EXAM: ULTRASOUND GUIDED THERAPEUTIC THORACENTESIS MEDICATIONS: 6 cc of 1% lidocaine  COMPLICATIONS: None immediate. PROCEDURE: An ultrasound guided thoracentesis was thoroughly discussed with the patient and questions answered. The benefits, risks, alternatives and complications were also discussed. The patient understands and wishes to proceed with the procedure. Written consent was obtained. Ultrasound was performed to localize and mark an adequate pocket of fluid in the right chest. The area was then prepped and draped in the normal sterile fashion. 1% Lidocaine   was used for local anesthesia. Under ultrasound guidance a 6 Fr Safe-T-Centesis catheter was introduced. Thoracentesis was performed. The catheter was removed and a dressing applied. FINDINGS: A total of approximately 825 cc of clear, dark amber pleural fluid was removed. IMPRESSION: Successful ultrasound guided right thoracentesis yielding 825 cc of pleural fluid. Procedure performed by Lambert Pillion, PA-C Electronically Signed   By: Nicoletta Barrier M.D.   On: 10/02/2023 15:39   DG Chest 2 View Result Date: 10/02/2023 CLINICAL DATA:  Cough. EXAM: CHEST - 2 VIEW COMPARISON:  Chest radiograph dated 09/22/2023. FINDINGS: Right pleural effusion with partial compressive atelectasis of the right lung, progressed since the prior radiograph. And LL airspace density in the right mid lung field may represent atelectasis or infiltrate. The left lung is clear. No pneumothorax. Stable cardiomegaly. Degenerative changes of the spine. No acute osseous pathology. IMPRESSION: 1.  Right pleural effusion with partial compressive atelectasis of the right lung. 2. Right mid lung field atelectasis versus infiltrate. Electronically Signed   By: Angus Bark M.D.   On: 10/02/2023 13:26   DG Abd Portable 1V Result Date: 09/27/2023 CLINICAL DATA:  Nausea, vomiting. EXAM: PORTABLE ABDOMEN - 1 VIEW COMPARISON:  July 02, 2009. FINDINGS: Moderate gastric distention is noted. No significant dilatation of large or small bowel is noted. Mild amount of stool seen throughout the colon. IMPRESSION: Mild stool burden. Moderate gastric distention is noted of uncertain etiology. Electronically Signed   By: Rosalene Colon M.D.   On: 09/27/2023 12:14   US  RENAL Result Date: 09/26/2023 CLINICAL DATA:  [id number] AKI (acute kidney injury) (HCC) [id number] EXAM: RENAL / URINARY TRACT ULTRASOUND COMPLETE COMPARISON:  September 21, 2023 FINDINGS: Right Kidney: Renal measurements: 11.6 x 5.9 x 6.3 cm = volume: 224 mL. Multiple cysts, the largest measuring 7.3 x 6.3 x 7.1 cm. Increased cortical echogenicity. No hydronephrosis or nephrolithiasis. Left Kidney: Renal measurements: 12.4 x 5.1 x 5.4 cm = volume: 149 mL. Increased echogenicity No hydronephrosis or nephrolithiasis. Multiple cysts, the largest measures 5.5 x 5.6 x 6.4 cm. Bladder: Appears normal for degree of bladder distention. Other: Lobular hypoechogenicity in the gallbladder measuring 2.9 x 1.4 x 2.9 cm. 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 IMPRESSION: No hydronephrosis or nephrolithiasis. 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 Electronically Signed   By: Rance Burrows M.D.   On: 09/26/2023 08:20   MR CERVICAL SPINE WO CONTRAST Result Date: 09/25/2023 CLINICAL DATA:  acute neck pain, fall. EXAM: MRI CERVICAL SPINE WITHOUT CONTRAST TECHNIQUE: Multiplanar, multisequence MR imaging of the cervical spine was performed. No intravenous contrast was administered. COMPARISON:  None Available. FINDINGS: Alignment:  Approximately 4 mm of anterolisthesis of C5 on C6, similar to recent CT. No other sagittal subluxation. Vertebrae: Please see recent CT of the cervical spine for characterization of bony detail. There is heterogeneous marrow without visible marrow edema. No suspicious bone lesions. Cord: Normal cord signal. Posterior Fossa, vertebral arteries, paraspinal tissues: Small volume of prevertebral edema, greatest in the upper cervical spine and extending inferiorly to the cervicothoracic junction. Uplifting of the anterior longitudinal ligament with suspected disruption at C2 (for example see series 2, image 10). Incomplete STIR suppression in areas limits assessment. Disc levels: Severe craniocervical degenerative change. C2-C3: Facet arthropathy. No significant canal or foraminal stenosis. C3-C4: Posterior disc osteophyte complex. Bilateral facet and uncovertebral hypertrophy. Resulting severe right and moderate to severe left foraminal stenosis. Mild canal stenosis. C4-C5: Left greater than right facet and uncovertebral hypertrophy. Resulting  moderate to severe left foraminal stenosis and mild right foraminal stenosis. Patent canal. C5-C6: 4 mm of anterolisthesis of C4 on C5. Posterior disc osteophyte complex with bilateral facet and uncovertebral hypertrophy. Resulting moderate canal stenosis and severe bilateral foraminal stenosis. C6-C7: Left greater than right facet and uncovertebral hypertrophy. Resulting severe left and moderate right foraminal stenosis. Patent canal. C7-T1: Left greater than right facet and uncovertebral hypertrophy. Resulting mild-to-moderate bilateral foraminal stenosis. Patent canal. IMPRESSION: 1. 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 Adjacent small volume of prevertebral edema which extends inferiorly to the cervicothoracic junction. 2. At C5-C6, moderate canal stenosis and severe bilateral foraminal stenosis. 3. At C6-C7, severe left and moderate right foraminal stenosis.  4. At C3-C4, severe right and moderate to severe left foraminal stenosis. 5. At C4-C5, moderate to severe left foraminal stenosis. These results will be called to the ordering clinician or representative by the Radiologist Assistant, and communication documented in the PACS or [employer]. Electronically Signed   By: Stevenson Elbe M.D.   On: 09/25/2023 00:52   CT HEAD WO CONTRAST ( ) Result Date: 09/24/2023 CLINICAL DATA:  Follow-up subarachnoid hemorrhage. EXAM: CT HEAD WITHOUT CONTRAST TECHNIQUE: Contiguous axial images were obtained from the base of the skull through the vertex without intravenous contrast. RADIATION DOSE REDUCTION: This exam was performed according to the departmental dose-optimization program which includes automated exposure control, adjustment of the mA and/or kV according to patient size and/or use of iterative reconstruction technique. COMPARISON:  CT head 09/21/2023 FINDINGS: Brain: Focal subarachnoid hemorrhage involving the sulci within the left parietal lobe are redemonstrated and appear slightly less conspicuous compared to 09/21/2023. No significant mass effect. No midline shift. Stable ventricular caliber. No new hemorrhage is noted. No evidence of acute infarct. Generalized cerebral atrophy. Vascular: No hyperdense vessel or unexpected calcification. Skull: No acute fracture. Decreased size of the left frontal scalp hematoma. Sinuses/Orbits: No acute abnormality. Other: None. IMPRESSION: Slightly decreased subarachnoid hemorrhage in the left parietal lobe compared to 09/21/2023. No new hemorrhage. Electronically Signed   By: Rozell Cornet M.D.   On: 09/24/2023 22:33   MR SHOULDER LEFT WO CONTRAST Result Date: 09/22/2023 CLINICAL DATA:  Fall. EXAM: MRI OF THE LEFT SHOULDER WITHOUT CONTRAST TECHNIQUE: Multiplanar, multisequence MR imaging of the shoulder was performed. No intravenous contrast was administered. COMPARISON:  None Available. FINDINGS: Rotator cuff:  Supraspinatus and infraspinatus tendinosis with partial-thickness articular sided insertional tearing at the junctional fibers of the posterior supraspinatus and anterior infraspinatus tendons. Teres minor tendon is intact. Subscapularis tendon is intact. Muscles: Partial-thickness tear at the origin of the acromial head of the deltoid muscle with associated intramuscular edema. No intramuscular collection. No significant muscle atrophy. Biceps Long Head: Diminutive appearance with chronic tearing of the intra-articular biceps tendon. Acromioclavicular Joint: Moderate arthropathy of the acromioclavicular joint. Widening of the acromioclavicular joint measuring 8-10 mm with associated joint effusion and disruption of the superior acromioclavicular ligament. No subacromial/subdeltoid bursal fluid. Glenohumeral Joint: Glenohumeral joint effusion. Diffuse thinning of the glenoid articular cartilage. Labrum: Diffuse labral degeneration. Tear of the superior labrum extending through the posteroinferior labrum. Bones: No acute fracture identified. Subcortical cystic change at the greater tuberosity. No aggressive osseous lesion. IMPRESSION: 1. Widening of the acromioclavicular joint measuring 8-10 mm with disruption of the superior acromioclavicular ligament and associated joint effusion. These findings are compatible with [place] type II injury. 2. Partial-thickness tear at the origin of the acromial head of the deltoid muscle with associated grade 2 strain. 3. Diffuse labral degeneration with  tear of the superior labrum extending through the posteroinferior labrum. 4. Supraspinatus and infraspinatus tendinosis with partial-thickness articular sided insertional tearing at the junctional fibers of the posterior supraspinatus and anterior infraspinatus tendons. 5. Glenohumeral joint effusion. Electronically Signed   By: Mannie Seek M.D.   On: 09/22/2023 16:57   IR THORACENTESIS ASP PLEURAL SPACE W/IMG GUIDE Result  Date: 09/22/2023 INDICATION: 83 year old male presented to ED after a fall, previous CT showed right pleural effusion. Request for therapeutic and diagnostic thoracentesis. EXAM: ULTRASOUND GUIDED RIGHT THORACENTESIS MEDICATIONS: 10 mL 1% lidocaine  COMPLICATIONS: None immediate. PROCEDURE: An ultrasound guided thoracentesis was thoroughly discussed with the patient and his wife and questions answered. Wife states that patient had thoracentesis at [facility] before. The benefits, risks, alternatives and complications were also discussed. The patient and his wife understands and wishes to proceed with the procedure. Written consent was obtained. Ultrasound was performed to localize and mark an adequate pocket of fluid in the right chest. The area was then prepped and draped in the normal sterile fashion. 1% Lidocaine  was used for local anesthesia. Under ultrasound guidance a 6 Fr Safe-T-Centesis catheter was introduced. Thoracentesis was performed. The catheter was removed and a dressing applied. FINDINGS: A total of approximately 1650 mL of hazy amber fluid was removed. Samples were sent to the laboratory as requested by the clinical team. Post procedure chest X-ray reviewed, negative for pneumothorax. IMPRESSION: Successful ultrasound guided right thoracentesis yielding 1650 mL of pleural fluid. Performed by: Aimee Han, PA-C Electronically Signed   By: Nicoletta Barrier M.D.   On: 09/22/2023 16:05   DG Chest 1 View Result Date: 09/22/2023 CLINICAL DATA:  [id number] Status post thoracentesis [id number]. EXAM: CHEST  1 VIEW COMPARISON:  09/21/2023. FINDINGS: There is small right pleural effusion with associated compressive atelectatic changes in the right lung. There is significant interval decrease in the amount of effusion, since the prior study, status post thoracentesis. No pneumothorax. Bilateral lung fields are otherwise clear. Left lateral costophrenic angle is clear. Stable cardio-mediastinal silhouette. No acute osseous  abnormalities. Right reverse shoulder arthroplasty noted. The soft tissues are within normal limits. IMPRESSION: *Significant interval decrease in the amount of right pleural effusion, status post thoracentesis. No pneumothorax. Electronically Signed   By: Beula Brunswick M.D.   On: 09/22/2023 15:44   CT CHEST ABDOMEN PELVIS W CONTRAST Result Date: 09/21/2023 CLINICAL DATA:  Trauma EXAM: CT CHEST, ABDOMEN, AND PELVIS WITH CONTRAST TECHNIQUE: Multidetector CT imaging of the chest, abdomen and pelvis was performed following the standard protocol during bolus administration of intravenous contrast. RADIATION DOSE REDUCTION: This exam was performed according to the departmental dose-optimization program which includes automated exposure control, adjustment of the mA and/or kV according to patient size and/or use of iterative reconstruction technique. CONTRAST:  75mL OMNIPAQUE  IOHEXOL  350 MG/ML SOLN COMPARISON:  CT abdomen and pelvis 05/02/2023. MRI abdomen 02/02/2023. FINDINGS: CT CHEST FINDINGS Cardiovascular: Ascending aortic aneurysm measures 4 cm. The heart is moderately enlarged. There is a trace pericardial effusion. There are atherosclerotic calcifications of the aorta. Mediastinum/Nodes: There is a peripherally calcified right thyroid  nodule measuring 1 cm. There are no enlarged mediastinal or hilar lymph nodes. The visualized esophagus is within normal limits. Lungs/Pleura: There is a large right pleural effusion which has increased from prior. There is compressive atelectasis of the right middle lobe and right lower lobe. There is minimal dependent atelectasis in the left lower lobe. The left lung is otherwise clear. Trachea and central airways are patent. Musculoskeletal: Right shoulder arthroplasty is present.  No acute fractures are seen. CT ABDOMEN PELVIS FINDINGS Hepatobiliary: Heterogeneous enhancing lesion in the liver measures 2.9 x 2.4 cm and is not well seen on delayed imaging favored as hemangioma,  confirmed by prior MRI. No subcapsular fluid collection. Gallbladder and bile ducts are within normal limits. Pancreas: Unremarkable. No pancreatic ductal dilatation or surrounding inflammatory changes. Spleen: The spleen is mild-to-moderately enlarged, unchanged. Adrenals/Urinary Tract: Bilateral renal cysts are unchanged. The largest simple cyst in the right kidney measures 7.4 cm. The largest simple cyst in the left kidney measures 6.3 cm. Within the inferior pole the right kidney there is an exophytic cystic area with peripheral calcifications measuring 7.9 x 12.5 cm, similar to prior. There are numerable additional hypodensities which are too small to characterize in both kidneys, favored as cysts. There is no hydronephrosis or perinephric fluid collection. The adrenal glands and bladder are within normal limits. Stomach/Bowel: Stomach is within normal limits. Appendix appears normal. No evidence of bowel wall thickening, distention, or inflammatory changes. There is diffuse colonic diverticulosis. 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 Aorta and IVC are normal in size there are atherosclerotic calcifications of the aorta and coronary arteries. No enlarged lymph nodes are identified. Reproductive: Prostate is unremarkable. Other: No abdominal wall hernia or abnormality. No abdominopelvic ascites. Musculoskeletal: Multilevel degenerative changes affect the spine. No acute fractures are identified. IMPRESSION: 1. No acute posttraumatic sequelae in the chest, abdomen or pelvis. 2. Large right pleural effusion has increased from prior with compressive atelectasis of the right middle lobe and right lower lobe. 3. Stable cardiomegaly. 4. 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 Recommend annual imaging followup by CTA or MRA. This recommendation follows 2010 ACCF/AHA/AATS/ACR/ASA/SCA/SCAI/SIR/STS/SVM Guidelines for the Diagnosis and Management of Patients with Thoracic Aortic Disease.  Circulation. 2010; 121: Z610-R604. Aortic aneurysm NOS (ICD10-I71.9) 5. Stable splenomegaly and splenic varices. 6. 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 7. Stable bilateral renal cysts. Stable 1 cm peripherally calcified cyst in the inferior pole of the right kidney. 8. Colonic diverticulosis. Aortic Atherosclerosis (ICD10-I70.0). Electronically Signed   By: Tyron Gallon M.D.   On: 09/21/2023 17:26   DG Pelvis Portable Result Date: 09/21/2023 CLINICAL DATA:  Status post fall on blood thinners with altered mental status EXAM: PORTABLE PELVIS 2 VIEWS COMPARISON:  CT abdomen and pelvis dated 05/02/2023 FINDINGS: There is no evidence of acute displaced pelvic fracture or diastasis. No pelvic bone lesions are seen. IMPRESSION: No acute displaced pelvic fracture or diastasis. Electronically Signed   By: Limin  Xu M.D.   On: 09/21/2023 16:38   DG Chest Port 1 View Result Date: 09/21/2023 CLINICAL DATA:  Trauma EXAM: PORTABLE CHEST 1 VIEW COMPARISON:  Chest x-ray 05/04/2023 FINDINGS: There is a moderate-sized right pleural effusion. The heart is enlarged there central pulmonary vascular congestion. The cardiac silhouette is enlarged, unchanged. Right shoulder arthroplasty is present. No acute fractures are seen. IMPRESSION: 1. Moderate-sized right pleural effusion. 2. Cardiomegaly with central pulmonary vascular congestion. Electronically Signed   By: Tyron Gallon M.D.   On: 09/21/2023 16:38   CT HEAD WO CONTRAST Addendum Date: 09/21/2023 ADDENDUM REPORT: 09/21/2023 16:05 ADDENDUM: These results were called by telephone at the time of interpretation on 09/21/2023 at 4:02 pm to provider TRAVIS YOUNG , who verbally acknowledged these results. Electronically Signed   By: Denny Flack M.D.   On: 09/21/2023 16:05   Result Date: 09/21/2023 CLINICAL DATA:  Blunt trauma, fall. EXAM: CT HEAD WITHOUT CONTRAST TECHNIQUE: Contiguous axial images were obtained from the base  of the skull through the vertex without  intravenous contrast. RADIATION DOSE REDUCTION: This exam was performed according to the departmental dose-optimization program which includes automated exposure control, adjustment of the mA and/or kV according to patient size and/or use of iterative reconstruction technique. COMPARISON:  CT head 04/17/2023 FINDINGS: Brain: Focal subarachnoid hemorrhage involving sulci within the left parietal lobe. No additional areas of intracranial hemorrhage noted. No edema or mass effect. No midline shift. No CT evidence of acute infarct. Nonspecific hypoattenuation in the periventricular and subcortical white matter favored to reflect chronic microvascular ischemic changes. The basilar cisterns are patent. Ventricles: Prominence of the ventricles suggesting underlying parenchymal volume loss. Vascular: Atherosclerotic calcifications of the carotid siphons and intracranial vertebral arteries. No hyperdense vessel. Skull: No acute or aggressive finding. Orbits: Bilateral lens replacement. Scleral buckle on the right. Orbits otherwise unremarkable. Sinuses: Mild mucosal thickening in the right maxillary sinus. Other: Mastoid air cells are clear. Left frontal scalp hematoma. Additional soft tissue swelling in the left parietal scalp. Traumatic Brain Injury Risk Stratification Skull Fracture: No - Low/mBIG 1 Subdural Hematoma (SDH): No - Low Subarachnoid Hemorrhage [facility]): trace (up to 2 sulci) - Low/mBIG 1 Epidural Hematoma (EDH): No - Low/mBIG 1 Cerebral contusion, intra-axial, intraparenchymal Hemorrhage (IPH): No Intraventricular Hemorrhage (IVH): No - Low/mBIG 1 Midline Shift > 1mm or Edema/effacement of sulci/vents: No - Low/mBIG 1 ---------------------------------------------------- IMPRESSION: Focal subarachnoid hemorrhage involving a single sulcus of the left parietal lobe. No edema, mass effect, or midline shift. Left frontoparietal scalp hematomas. Chronic microvascular ischemic changes and mild to moderate parenchymal  volume loss. Electronically Signed: By: Denny Flack M.D. On: 09/21/2023 15:58   CT CERVICAL SPINE WO CONTRAST Result Date: 09/21/2023 CLINICAL DATA:  Poly trauma, blunt.  Status post fall. EXAM: CT CERVICAL SPINE WITHOUT CONTRAST TECHNIQUE: Multidetector CT imaging of the cervical spine was performed without intravenous contrast. Multiplanar CT image reconstructions were also generated. RADIATION DOSE REDUCTION: This exam was performed according to the departmental dose-optimization program which includes automated exposure control, adjustment of the mA and/or kV according to patient size and/or use of iterative reconstruction technique. COMPARISON:  Cervical spine radiographs 09/18/2012. MRI cervical spine 01/14/2011. Head CT and neck CTA 04/12/2022. FINDINGS: Alignment: Straightening of the usual cervical lordosis with a mild convex right scoliosis. 4 mm of anterolisthesis at C5-6, increased from previous cervical MRI. Skull base and vertebrae: There is diffuse spondylosis with severe disc space narrowing and facet arthrosis throughout the cervical spine. There is interfacetal ankylosis bilaterally at C2-3 and on the left at C4-5. Possible interbody ankylosis at those levels as well. Asymmetric arthrosis involving the right lateral atlantoaxial joint. Suggested nondisplaced fracture of the posterior arch of C1 on the right (best seen on axial images 24 and 25 of series 4) is grossly unchanged from previous head CT (image 1/5). No other evidence of acute cervical spine fracture or traumatic subluxation. Soft tissues and spinal canal: No prevertebral fluid or swelling. No visible canal hematoma. Disc levels: As alluded to above, there is severe multilevel spondylosis with disc space narrowing, uncinate spurring and facet arthrosis. Findings have progressed from previous MRI and contribute to spinal stenosis and foraminal narrowing at multiple levels. Upper chest: Chest findings dictated separately. Other:  Bilateral carotid atherosclerosis. IMPRESSION: 1. No evidence of acute cervical spine fracture or traumatic subluxation. 2. 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 3. No acute soft tissue abnormalities identified. Electronically Signed   By: Elmon Hagedorn M.D.   On: 09/21/2023 15:25  Labs:  Basic Metabolic Panel: Recent Labs  Lab 10/10/23 0510 10/16/23 1028  NA 140 137  K 5.2* 5.5*  CL 109 105  CO2 20* 23  GLUCOSE 80 90  BUN 80* 58*  CREATININE 3.03* 3.70*  CALCIUM  6.9* 7.5*    CBC: Recent Labs  Lab 10/10/23 0510 10/16/23 1028  WBC 5.2 2.0*  NEUTROABS  --  1.5*  HGB 8.7* 8.0*  HCT 27.4* 25.8*  MCV 90.1 93.1  PLT 165 76*    CBG: No results for input(s): "GLUCAP" in the last 168 hours.    Family history.  Father with heart disease Sister with dementia son with diabetes.  Denies any colon cancer esophageal cancer or rectal cancer  Brief HPI:   [name] is a 83 y.o. 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  Per chart review patient lives with spouse.  Split-level home 3 steps to entry modified independent with a straight point cane.  Presented 09/21/2023 after being found down in the yard.  Cranial CT scan showed focal subarachnoid hemorrhage involving a single sulcus of the left parietal lobe.  No edema mass effect or midline shift.  Left frontal parietal scalp hematoma.  CT cervical spine negative for fracture.  CT of the chest showed abdomen pelvis showed no acute posttraumatic sequela in the chest abdomen or pelvis.  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  Patient did undergo thoracentesis of right pleural effusion with 1.6 L yield.  MRI of the left shoulder showed widening of the acromioclavicular joint measuring 8-10 mm with disruption of the superior acromioclavicular ligament with associated joint effusion.  Supraspinatus and infraspinatus tendinosis with partial-thickness articular sided insertional tearing at the junctional fibers of the posterior supraspinatus and anterior infraspinatus tendons.  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  C5-6 moderate canal stenosis and severe bilateral foraminal stenosis as well as C6-7 severe left and moderate right foraminal stenosis with C3-4 severe right moderate to severe left foraminal stenosis as well as C4-5.  Admission chemistries unremarkable except potassium 5.2 BUN 63 creatinine 3.77 hemoglobin 8.5 blood culture Staphylococcus species.  Patient was placed on vancomycin  for positive culture suspect contaminant.  Vancomycin  and Zosyn  had been discontinued repeat blood cultures ordered negative thus far.  Orthopedic service Dr. Randal Bury consulted in regards to left [address] [employer] joint separation advise nonweightbearing left upper extremity with shoulder sling for comfort.  Patient denied any shoulder pain.  Follow-up neurosurgery regards to small subarachnoid hemorrhage advised conservative care and Eliquis  remained on hold.  Repeat cranial CT scan without contrast showed decrease subarachnoid hemorrhage.  Neurosurgery [name] also follow-up for cervical spondylosis and again advised conservative care not a surgical candidate at this time and placed on Decadron  therapy.  Renal service is consulted for acute on chronic CKD stage IV with creatinine increasing to 4.76-5 0.1-4.98 from baseline 3.5 and renal ultrasound showed no hydronephrosis and patient was receiving IV fluids.  No current indications for  hemodialysis.  Bouts of hyperkalemia requiring Lokelma  and monitoring of chemistries.  Patient had bouts of hypotension requiring ProAmatine .  Anemia of chronic disease latest hemoglobin  7.3-7.8.  Patient did experience bouts of nausea vomiting 09/27/2023 KUB showed mild stool burden moderate gastric distention and bowel program regulated as well as maintained on Reglan .  Palliative care consulted to establish goals of care.  Therapy evaluations completed due to patient's decreased functional mobility was admitted for a comprehensive rehab program.   Hospital Course: SKYLEN DEBORD was admitted to rehab 09/29/2023 for inpatient therapies to consist of PT, ST and OT at least three hours five days a week. Past admission physiatrist, therapy team and rehab RN have worked together to provide customized collaborative inpatient rehab.  Pertaining to patient's traumatic SAH remained stable follow-up neurosurgery [name].  Chronic Eliquis  for history of PAF remained on hold due to [facility] cardiac rate controlled continued on amiodarone  as well as beta-blocker with follow-up per cardiology.  Mood stabilization with Seroquel  as needed and patient's mental status much improved.  Longstanding cervical spondylosis status post fusion C1-C5 neurosurgery follow-up no current plans for surgical intervention.  Acute on chronic CKD stage IV follow-up nephrology services renal ultrasound negative no current plan for HD.  Left shoulder supraspinatus infraspinatus tendinosis with partial-thickness tear follow-up Dr. Abigail Abler nonweightbearing left upper extremity shoulder sling for comfort and he was advanced to weightbearing as tolerated per orthopedic services.  Acute on chronic anemia/myelofibrosis/leukopenia follow-up oncology services [name] and transfused as needed.  Prior to admission patient was receiving Revlimid .  Patient with recurrent large right pleural effusion he did receive ultrasound thoracentesis while on the acute care  side as well as thoracentesis 10/02/2023 with a yield of 825 cc.  Synthroid  ongoing for hypothyroidism.  Zocor  for hyperlipidemia.  GERD with noted Protonix  as directed.     Blood pressures were monitored on TID basis and remained soft and monitored     Rehab course: During patient's stay in rehab weekly team conferences were held to monitor patient's progress, set goals and discuss barriers to discharge. At admission, patient required max assist lateral scoot transfers minimal assist stand pivot transfers minimal assist step pivot transfers  Physical exam.  Blood pressure 152/81 pulse 71 temperature 98.3 respirations 18 oxygen saturation is 94% room air Constitutional.  No acute distress HEENT.  Large abrasion on his forehead Eyes.  Pupils round and reactive to light no discharge without nystagmus Neck.  Supple nontender no JVD without thyromegaly Cardiac regular rate and rhythm without any extra sounds or murmur heard Abdomen.  Soft nontender positive bowel sounds without rebound Respiratory effort normal no respiratory distress without wheeze Neurologic.  Alert and oriented to person and date in the hospital.  He said he was in [place].  Delayed responses at times. Motor. Right upper extremity 4/5 deltoid 4+/5 bicep 4+/5 tricep 4+/5 grip Left upper extremity at least 4/5 bicep 4/5 tricep 4/5 grip able to lift shoulder to gravity limited by nonweightbearing status Right lower extremity hip flexors 4+/5 knee extension 5/5 ADF 4+/5 APF 4+/5 Left lower extremity hip flexors 4+/5 knee extension 5/5 ADF 4+/5 APF 4+/5  He/She  has had improvement in activity tolerance, balance, postural control as well as ability to compensate for deficits. He/She has had improvement in functional use RUE/LUE  and RLE/LLE as well as improvement in awareness.  Patient performed supine to sit with increased time requiring cues for positioning edge of bed.  Perform sit to stand with cues for hand placement  and contact-guard.  Ambulates contact-guard to 130 feet with large-base quad cane.  He did receive rest breaks as needed.  He donned  his shirt with supervision pants with good use of figure 4 technique contact-guard.  Contact-guard assist for toileting task.  Amatory transfer to the bathroom with contact-guard.  Full family teaching completed plan discharge to home       Disposition:  Discharge disposition: 06-Home-Health Care Svc        Diet: Soft  Special Instructions: No driving smoking or alcohol  Weightbearing as tolerated left upper extremity shoulder sling for comfort  Continue to hold Eliquis  until follow-up neurosurgery  Follow-up with hematology services on resuming of Revlimid   Medications at discharge. 1.  Tylenol  as needed 2.  Amiodarone  200 mg p.o. daily 3.  Cymbalta  20 mg p.o. nightly 4.  Synthroid  100 mcg p.o. daily 5.  Lidoderm  patch changes directed 6.  Cyanocobalamin  1000 mcg daily 7.  Tramadol  50 mg every 12 hours as needed 8.  Multivitamin daily 9.  Protonix  40 mg p.o. daily 10.  MiraLAX  daily hold for loose stools 11.  Seroquel  25 mg p.o. nightly as needed 12.  Zocor  40 mg p.o. nightly 13.  Sodium bicarbonate  1300 mg p.o. 3 times daily 14.  Lopressor  50 mg twice daily 15.  Os-Cal 1 tablet daily 16.  Citracal plus D 1 tablet daily    30-35 minutes are spent completing discharge summary and discharge planning  Discharge Instructions     Ambulatory referral to Physical Medicine Rehab   Complete by: As directed    Moderate complexity follow-up 1 to 2 weeks traumatic SAH        Follow-up Information     Cherri Corns C, DO Follow up.   Specialty: Physical Medicine and Rehabilitation Why: Office to call for appointment Contact information: [address] [place] [address] [phone]         Audie Bleacher, MD Follow up.   Specialty: Neurosurgery Why: Call for appointment Contact information: [address] [place] [address] [phone]         Saundra Curl, MD Follow up.   Specialty: Orthopedic Surgery Why: Call for appointment Contact information: [address] [place] [address] [phone]         Levorn Reason, MD Follow up.   Specialty: Internal Medicine Why: Call for appointment Contact information: [address] [place] [address] [phone]         Avonne Boettcher, MD Follow up.   Specialty: Oncology Why: Call for appointment Contact information: [address] [place] [address] [phone]         Gennaro Khat, Cadence H, PA-C Follow up.   Specialty: Cardiology Why: Hospital follow-up wtih General Cardiology scheduled for 11/09/2023 at 10:30am. Please arrive 15 minutes early for check-in. If this date/ time does not work for you, please call our office to reschedule. Contact information: [address] [place] [address] [phone]         Valeda Garter R, PA Follow up.   Specialty: Cardiology Why: Follow-up in the At[facility] Fibrillation Clinic scheduled for 10/17/2023 at 11am. Please arrive 15 minutes early for check-in. If this date/ time does not work for you, please call our office to schedule. Contact information: [address] [place] [address] [phone]                 Signed: Everlyn Hockey Nieves Barberi 10/16/2023, 2:04 PM

## 2023-09-30 NOTE — Progress Notes (Addendum)
 Inpatient Rehabilitation Admission Medication Review by a Pharmacist  A complete drug regimen review was completed for this patient to identify any potential clinically significant medication issues.  High Risk Drug Classes Is patient taking? Indication by Medication  Antipsychotic Yes Seroquel  - delirium   Anticoagulant No   Antibiotic No   Opioid Yes Tramadol  - pain  Antiplatelet No   Hypoglycemics/insulin  No   Vasoactive Medication No   Chemotherapy No   Other Yes Amiodarone  - afib Dexamethasone  - myelomibrosis Cymbalta  - depression Synthroid  - hypothyroidism Lidocaine  patch/muscle rub - pain Melatonin - sleep Midodrine  - low BP MVI - supplement Protonix  - GERD Zocor  - HLD Sodium bicarb - acidosis Robaxin  - spasms     Type of Medication Issue Identified Description of Issue Recommendation(s)  Drug Interaction(s) (clinically significant)     Duplicate Therapy     Allergy     No Medication Administration End Date     Incorrect Dose     Additional Drug Therapy Needed     Significant med changes from prior encounter (inform family/care partners about these prior to discharge).    Other       Clinically significant medication issues were identified that warrant physician communication and completion of prescribed/recommended actions by midnight of the next day:  No  Name of provider notified for urgent issues identified:   Provider Method of Notification:     Pharmacist comments:   Time spent performing this drug regimen review (minutes):  20  Ivery Marking, PharmD, [place], AAHIVP, CPP Infectious Disease Pharmacist 09/29/2023 2:59 PM

## 2023-10-01 DIAGNOSIS — S43102D Unspecified dislocation of left acromioclavicular joint, subsequent encounter: Secondary | ICD-10-CM | POA: Diagnosis not present

## 2023-10-01 DIAGNOSIS — D7581 Myelofibrosis: Secondary | ICD-10-CM | POA: Diagnosis not present

## 2023-10-01 DIAGNOSIS — D61810 Antineoplastic chemotherapy induced pancytopenia: Secondary | ICD-10-CM | POA: Diagnosis not present

## 2023-10-01 DIAGNOSIS — S066XAD Traumatic subarachnoid hemorrhage with loss of consciousness status unknown, subsequent encounter: Secondary | ICD-10-CM | POA: Diagnosis not present

## 2023-10-01 LAB — CBC WITH DIFFERENTIAL/PLATELET
Abs Immature Granulocytes: 0 10*3/uL (ref 0.00–0.07)
Basophils Absolute: 0 10*3/uL (ref 0.0–0.1)
Basophils Relative: 0 %
Eosinophils Absolute: 0 10*3/uL (ref 0.0–0.5)
Eosinophils Relative: 0 %
HCT: 24.4 % — ABNORMAL LOW (ref 39.0–52.0)
Hemoglobin: 7.6 g/dL — ABNORMAL LOW (ref 13.0–17.0)
Lymphocytes Relative: 34 %
Lymphs Abs: 1.6 10*3/uL (ref 0.7–4.0)
MCH: 28.1 pg (ref 26.0–34.0)
MCHC: 31.1 g/dL (ref 30.0–36.0)
MCV: 90.4 fL (ref 80.0–100.0)
Monocytes Absolute: 0.7 10*3/uL (ref 0.1–1.0)
Monocytes Relative: 14 %
Neutro Abs: 2.4 10*3/uL (ref 1.7–7.7)
Neutrophils Relative %: 52 %
Platelets: 241 10*3/uL (ref 150–400)
RBC: 2.7 MIL/uL — ABNORMAL LOW (ref 4.22–5.81)
RDW: 18.7 % — ABNORMAL HIGH (ref 11.5–15.5)
WBC: 4.7 10*3/uL (ref 4.0–10.5)
nRBC: 0 /100{WBCs}
nRBC: 0.9 % — ABNORMAL HIGH (ref 0.0–0.2)

## 2023-10-01 NOTE — Evaluation (Signed)
 Speech Language Pathology Assessment and Plan  Patient Details  Name: [name] MRN: [id number] Date of Birth: 1941-02-02  SLP Diagnosis: Cognitive Impairments  Rehab Potential: Excellent ELOS: 10-12 days    Today's Date: 10/01/2023 SLP Individual Time: 0800-0859 SLP Individual Time Calculation (min): 59 min   Hospital Problem: Principal Problem:   Traumatic subarachnoid hemorrhage (HCC)  Past Medical History:  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   right forearm, ED[facility]   Stroke (HCC) 11/2007   R brain subcortical infarct   Past Surgical History:  Past Surgical History:  Procedure Laterality Date   ASPIRATION / INJECTION RENAL CYST  07/08/2015   BACK SURGERY     approx 20- 25 years ago   COLONOSCOPY     EYE SURGERY     cataract both eyes   GAS INSERTION  08/11/2011   Procedure: INSERTION OF GAS;  Surgeon: Rexene Catching, MD;  Location: [facility] OR;  Service: Ophthalmology;  Laterality: Right;  C3F8   IR BONE MARROW BIOPSY & ASPIRATION  08/03/2023   IR THORACENTESIS ASP PLEURAL SPACE W/IMG GUIDE  09/22/2023   LEFT HEART CATH AND CORONARY ANGIOGRAPHY N/A 05/02/2023   Procedure: LEFT HEART CATH AND CORONARY ANGIOGRAPHY;  Surgeon: Sammy Crisp, MD;  Location: [facility] INVASIVE CV LAB;  Service: Cardiovascular;  Laterality: N/A;   REVERSE SHOULDER ARTHROPLASTY Right 04/13/2023   Procedure: REVERSE SHOULDER ARTHROPLASTY;  Surgeon: Elner Hahn, MD;  Location:  [facility] ORS;  Service: Orthopedics;  Laterality: Right;   SCLERAL BUCKLE  08/11/2011   Procedure: SCLERAL BUCKLE;  Surgeon: Rexene Catching, MD;  Location: [facility] OR;  Service: Ophthalmology;  Laterality: Right;   VARICOSE VEIN SURGERY      Assessment / Plan / Recommendation Clinical Impression HPI: [name] is a 83 year old right-handed male with history of atrial fibrillation maintained on Eliquis , hypertension, hyperlipidemia, hypothyroidism, chronic anemia, cervical fusion C1-C5, CKD stage IV, BPH, CVA as well as 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  Per chart review patient lives with spouse. Presented 09/21/2023 after being found down in the yard.  Cranial CT scan showed focal subarachnoid hemorrhage involving a single sulcus of the left parietal lobe.  No edema mass effect or midline shift.  Left frontal parietal scalp hematoma.  CT cervical spine negative.  CT of the chest abdomen pelvis showed no acute posttraumatic sequela in the chest abdomen or pelvis.  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  MRI of the shoulder showed widening of the acromioclavicular joint measuring 8-10 mm with disruption of the superior acromioclavicular ligament with associated joint effusion.  Supraspinatus and infraspinatus tendinosis with partial-thickness articular sided insertional tearing at the junctional fibers of the posterior supraspinatus and anterior infraspinatus tendons.  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  C5-6 moderate canal stenosis and severe bilateral foraminal stenosis as well as C6-7 severe left and moderate right foraminal stenosis with C3-4 severe right and moderate to severe left foraminal stenosis as well as C4-5.  Admission chemistries unremarkable except potassium 5.2 glucose 108 BUN 63 creatinine 3.77, hemoglobin 8.5, blood cultures Staphylococcus  species.  Orthopedic service follow-up Dr. Randal Bury in regards to left [facility] joint separation advise nonweightbearing left upper extremity with shoulder sling for comfort.  Follow-up neurosurgery regards to small subarachnoid hemorrhage advised conservative care and Eliquis  remains on hold.  Repeat cranial CT scan without contrast showed decrease subarachnoid hemorrhage.  Neurosurgery also followed up for cervical spondylosis and again advised conservative care/not a surgical candidate at this time and he was placed on Decadron  therapy.  Renal service is consulted for acute on chronic stage IV CKD with creatinine increasing to 4.76 from baseline of 3.5 and renal ultrasound completed showing no hydronephrosis and latest creatinine 5.01.  He has had bouts of hypotension requiring ProAmatine .   Clinical Impression:  Bedside Swallow Evaluation: A bedside swallow evaluation was completed to assess for s/sx of oropharyngeal dysphagia. Oral mechanism exam WFL. POs administered included thin liquids, purees and solids. Patient with adequate mastication and complete oral clearance with liquid wash. Patient with x1 delayed cough after PO, though with coughing occasionally throughout evaluation. Patient reports congestion and currently taking coughing medication (PRN Robitussin).  Recommend continuation of current diet with use of standardized precautions including sitting upright during PO, taking small bites/sips at a slow rate. Recommend intermittent supervision during mealtimes.  Cognitive-Linguistic: Patient was evaluated via the Cognistat to assess cognitive-linguistic functioning. Patient scored WFL on all subtests despite mild-moderate deficits in memory. 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 Patients language WFL. Recommend targeting memory, intellectual  awareness and higher level problem solving due to patients job PTA.  Dysarthria: Patient is 100% intelligible at the conversational level.  Pt would benefit from skilled ST services to maximize cognition in order to maximize functional independence at d/c. Anticipate patient will require supervision at d/c for higher level cognitive tasks.   Skilled Therapeutic Interventions          Patient evaluated using a standardized cognitive linguistic assessment and bedside swallow evaluation to assess current cognitive, communicative and swallowing function. See above for details.    SLP Assessment  Patient will need skilled Speech Lanaguage Pathology Services during CIR admission    Recommendations  SLP Diet Recommendations: Age appropriate regular solids;Thin Liquid Administration via: Cup;Straw Medication Administration: Whole meds with liquid Supervision: Patient able to self feed Compensations: Minimize environmental distractions;Slow rate;Small sips/bites Postural Changes and/or Swallow Maneuvers: Seated upright 90 degrees Oral Care Recommendations: Oral care BID Patient destination: Home Follow up Recommendations: None Equipment Recommended: None recommended by SLP    SLP Frequency 3 to 5 out of 7 days   SLP Duration  SLP Intensity  SLP Treatment/Interventions 10-12 days  Minumum of 1-2 x/day, 30 to 90 minutes  Cognitive remediation/compensation;Internal/external aids;Cueing hierarchy;Therapeutic Activities;Functional tasks;Patient/family education    Pain Denies  SLP Evaluation Cognition Overall Cognitive Status: Impaired/Different from baseline Arousal/Alertness: Awake/alert Orientation Level: Oriented X4 Year: 2025 Month: April Day of Week: Correct Attention: Sustained Sustained Attention: Appears intact Memory: Impaired Memory Impairment: Decreased short term memory Decreased Short Term Memory: Verbal basic;Functional basic Awareness: Impaired Awareness Impairment:  Intellectual impairment Problem Solving: Impaired Problem Solving  Impairment: Verbal complex;Functional complex Safety/Judgment: Impaired  Comprehension Auditory Comprehension Overall Auditory Comprehension: Appears within functional limits for tasks assessed Expression Expression Primary Mode of Expression: Verbal Verbal Expression Overall Verbal Expression: Appears within functional limits for tasks assessed Oral Motor Oral Motor/Sensory Function Overall Oral Motor/Sensory Function: Within functional limits Motor Speech Overall Motor Speech: Appears within functional limits for tasks assessed  Care Tool Care Tool Cognition Ability to hear (with hearing aid or hearing appliances if normally used Ability to hear (with hearing aid or hearing appliances if normally used): 2. Moderate difficulty - speaker has to increase volume and speak distinctly   Expression of Ideas and Wants Expression of Ideas and Wants: 4. Without difficulty (complex and basic) - expresses complex messages without difficulty and with speech that is clear and easy to understand   Understanding Verbal and Non-Verbal Content Understanding Verbal and Non-Verbal Content: 4. Understands (complex and basic) - clear comprehension without cues or repetitions  Memory/Recall Ability Memory/Recall Ability : Current season;Location of own room;Staff names and faces;That he or she is in a hospital/hospital unit   Bedside Swallowing Assessment General Previous Swallow Assessment: 4/18 Diet Prior to this Study: Regular;Thin liquids (Level 0) Respiratory Status: Room air Oral Cavity - Dentition: Dentures, top Self-Feeding Abilities: Able to feed self Vision: Functional for self-feeding Patient Positioning: Upright in bed Baseline Vocal Quality: Normal Volitional Cough: Strong Volitional Swallow: Able to elicit  Ice Chips Ice chips: Not tested Thin Liquid Thin Liquid: Impaired Presentation: Cup;Straw;Self Fed Pharyngeal   Phase Impairments: Cough - Delayed Other Comments: vs baseline Nectar Thick Nectar Thick Liquid: Not tested Honey Thick Honey Thick Liquid: Not tested Puree Puree: Within functional limits Presentation: Self Fed;Spoon Solid Solid: Within functional limits Presentation: Self Fed BSE Assessment Risk for Aspiration Impact on safety and function: Mild aspiration risk Other Related Risk Factors: Previous CVA;Cognitive impairment  Short Term Goals: Week 1: SLP Short Term Goal 1 (Week 1): Patient will recall and utilize memory compensatory aids given min multimodal A SLP Short Term Goal 2 (Week 1): Patient will demonstrate functional problem solving skills in mildly complex situations given min multimodal A SLP Short Term Goal 3 (Week 1): Patient will recall cognitive and physical changes since admission given min multimodal A  Refer to Care Plan for Long Term Goals  Recommendations for other services: None   Discharge Criteria: Patient will be discharged from SLP if patient refuses treatment 3 consecutive times without medical reason, if treatment goals not met, if there is a change in medical status, if patient makes no progress towards goals or if patient is discharged from hospital.  The above assessment, treatment plan, treatment alternatives and goals were discussed and mutually agreed upon: by patient  Satsuki Zillmer M.A., CCC-SLP 10/01/2023, 8:58 AM

## 2023-10-01 NOTE — Progress Notes (Signed)
 PROGRESS NOTE   Subjective/Complaints:  No issues overnite , labs reviewed  HOH  ROS- neg CP, SOB, N/V/D  Objective:   No results found. Recent Labs    09/30/23 0235 10/01/23 0658  WBC 2.5* 4.7  HGB 7.6* 7.6*  HCT 24.7* 24.4*  PLT 236 241   Recent Labs    09/29/23 0645 09/30/23 0235  NA 138 138  K 5.1 5.1  CL 110 109  CO2 15* 16*  GLUCOSE 123* 134*  BUN 80* 83*  CREATININE 4.67* 4.57*  CALCIUM  7.1* 7.4*    Intake/Output Summary (Last 24 hours) at 10/01/2023 0932 Last data filed at 10/01/2023 0543 Gross per 24 hour  Intake 636.02 ml  Output 950 ml  Net -313.98 ml     Pressure Injury 09/22/23 Face Left;Upper abrasion above left eye brow (Active)  09/22/23 1940  Location: Face  Location Orientation: Left;Upper  Staging:   Wound Description (Comments): abrasion above left eye brow  Present on Admission: Yes    Physical Exam: Vital Signs Blood pressure 127/89, pulse 97, temperature 97.6 F (36.4 C), temperature source Oral, resp. rate 18, weight 73 kg, SpO2 97%.  General: No acute distress Mood and affect are appropriate Heart: Regular rate and rhythm no rubs murmurs or extra sounds Lungs: Clear to auscultation, breathing unlabored, no rales or wheezes Abdomen: Positive bowel sounds, soft nontender to palpation, nondistended Extremities: No clubbing, cyanosis, or edema Skin: No evidence of breakdown, no evidence of rash Neurologic: Cranial nerves II through XII intact, motor strength is 4/5 in bilateral deltoid, bicep, tricep, grip, hip flexor, knee extensors, ankle dorsiflexor and plantar flexor  Musculoskeletal: Full range of motion in all 4 extremities. No joint swelling No tenderness to palpation Left shoulder , no pain with ROM    Assessment/Plan: 1. Functional deficits which require 3+ hours per day of interdisciplinary therapy in a comprehensive inpatient rehab setting. Physiatrist is  providing close team supervision and 24 hour management of active medical problems listed below. Physiatrist and rehab team continue to assess barriers to discharge/monitor patient progress toward functional and medical goals  Care Tool:  Bathing    Body parts bathed by patient: Right arm, Chest, Left arm, Abdomen, Front perineal area, Buttocks, Right upper leg, Left upper leg, Right lower leg, Left lower leg, Face         Bathing assist Assist Level: Moderate Assistance - Patient 50 - 74%     Upper Body Dressing/Undressing Upper body dressing   What is the patient wearing?: Pull over shirt    Upper body assist Assist Level: Supervision/Verbal cueing    Lower Body Dressing/Undressing Lower body dressing      What is the patient wearing?: Pants, Underwear/pull up     Lower body assist Assist for lower body dressing: Moderate Assistance - Patient 50 - 74%     Toileting Toileting    Toileting assist Assist for toileting: Moderate Assistance - Patient 50 - 74%     Transfers Chair/bed transfer  Transfers assist     Chair/bed transfer assist level: Minimal Assistance - Patient > 75%     Locomotion Ambulation   Ambulation assist  Assist level: Minimal Assistance - Patient > 75% Assistive device: Cane-quad Max distance: 19ft   Walk 10 feet activity   Assist     Assist level: Minimal Assistance - Patient > 75% Assistive device: Cane-quad   Walk 50 feet activity   Assist    Assist level: Minimal Assistance - Patient > 75% Assistive device: Cane-quad    Walk 150 feet activity   Assist Walk 150 feet activity did not occur: Safety/medical concerns (fatigue)         Walk 10 feet on uneven surface  activity   Assist     Assist level: Minimal Assistance - Patient > 75% Assistive device: Other (comment), Cane-quad (L handrail)   Wheelchair     Assist Is the patient using a wheelchair?: Yes Type of Wheelchair: Manual     Wheelchair assist level: Dependent - Patient 0% Max wheelchair distance: 185ft    Wheelchair 50 feet with 2 turns activity    Assist        Assist Level: Dependent - Patient 0%   Wheelchair 150 feet activity     Assist      Assist Level: Dependent - Patient 0%   Blood pressure 127/89, pulse 97, temperature 97.6 F (36.4 C), temperature source Oral, resp. rate 18, weight 73 kg, SpO2 97%.  Medical Problem List and Plan: 1. Functional deficits secondary to traumatic SAH fall             -patient may  shower             -ELOS/Goals: PT/OT/SLP Supervision, 10-12 days             `-Admit to CIR 2.  Antithrombotics: -DVT/anticoagulation:  Mechanical: Antiembolism stockings, thigh (TED hose) Bilateral lower extremities.  Chronic Eliquis  currently on hold due to [facility]             -antiplatelet therapy: N/A 3. Pain Management: LidoDerm  patch, Robaxin  as needed, tramadol  as needed 4. Mood/Behavior/Sleep: Provide emotional support             -antipsychotic agents: Seroquel  25 mg nightly 5. Neuropsych/cognition: This patient is capable of making decisions on his own behalf. 6. Skin/Wound Care: Routine skin checks 7. Fluids/Electrolytes/Nutrition: Routine ins and outs with follow-up chemistries 8.  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 Neurosurgery follow-up no current plan for surgical intervention however patient is currently maintained on Decadron  therapy for pain control. 9.  Recurrent large right pleural effusion.  Status post ultrasound thoracentesis 1.6 L fluid removed. 10.  PAF.  Rate controlled.  Continue amiodarone .  Eliquis  on hold due to [facility]. 11.  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  Follow-up nephrology services.  Renal ultrasound no hydronephrosis.  Currently no plans for HD. Avoid nephrotoxins. Monitor labs.  Maintain MAP > 65 12.  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  Follow-up orthopedic services Dr. Abigail Abler.   13.  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  Followed by oncology services [name].  Prior to admission patient was receiving Revlimid  5 mg mouth daily for 21 days then hold for 7 days repeating every 28 days 14.  Hypothyroidism.  Synthroid  15.  Hyperlipidemia.  Zocor  16.  History of BPH.  Check PVR 17.  GERD.  Protonix  18.  Hypotension.  Followed by renal services with plan to slowly wean ProAmatine . Vitals:   09/30/23 2012 10/01/23 6644  BP: (!) 153/86 127/89  Pulse: 99 97  Resp: 18 18  Temp: 98 F (36.7 C) 97.6 F (36.4 C)  SpO2: 97% 97%   Supine systolic BPs mildly elevated will order ortho vitals to see if midodrine  can be reduced  19. Occasional cough, reports started couple hours ago             -Start robitussin              -Repeat Xray chest if does not improve 20. Leukopenia- improved today              -Oncology s/p  GCSF, CBC daily    Latest Ref Rng & Units 10/01/2023    6:58 AM 09/30/2023    2:35 AM 09/29/2023    6:45 AM  CBC  WBC 4.0 - 10.5 K/uL 4.7  2.5  1.2  C   1.2   Hemoglobin 13.0 - 17.0 g/dL 7.6  7.6  7.3    7.4   Hematocrit 39.0 - 52.0 % 24.4  24.7  24.8    24.9   Platelets 150 - 400 K/uL 241  236  251    244     C Corrected result    21. L knee OA             -Xray nov 2024 indicates moderate OA             -Muscle rub prn     22.  Left AC joint disruption - Nonweightbearing left upper extremity with shoulder sling for comfort.    LOS: 2 days A FACE TO FACE EVALUATION WAS PERFORMED  Genetta Kenning 10/01/2023, 9:32 AM

## 2023-10-01 NOTE — Plan of Care (Signed)
  Problem: RH Problem Solving Goal: LTG Patient will demonstrate problem solving for (SLP) Description: LTG:  Patient will demonstrate problem solving for basic/complex daily situations with cues  (SLP) Flowsheets (Taken 10/01/2023 0856) LTG: Patient will demonstrate problem solving for (SLP): (mildly complex) Other (comment) LTG Patient will demonstrate problem solving for: Supervision   Problem: RH Memory Goal: LTG Patient will use memory compensatory aids to (SLP) Description: LTG:  Patient will use memory compensatory aids to recall biographical/new, daily complex information with cues (SLP) Flowsheets (Taken 10/01/2023 0856) LTG: Patient will use memory compensatory aids to (SLP): Supervision   Problem: RH Awareness Goal: LTG: Patient will demonstrate awareness during functional activites type of (SLP) Description: LTG: Patient will demonstrate awareness during functional activites type of (SLP) Flowsheets (Taken 10/01/2023 0856) Patient will demonstrate during cognitive/linguistic activities awareness type of: Intellectual LTG: Patient will demonstrate awareness during cognitive/linguistic activities with assistance of (SLP): Supervision

## 2023-10-01 NOTE — Plan of Care (Signed)
  Problem: RH BOWEL ELIMINATION Goal: RH STG MANAGE BOWEL WITH ASSISTANCE Description: STG Manage Bowel with  supervision Assistance. Outcome: Progressing   Problem: RH BLADDER ELIMINATION Goal: RH STG MANAGE BLADDER WITH ASSISTANCE Description: STG Manage Bladder With supervision Assistance Outcome: Progressing   Problem: RH SKIN INTEGRITY Goal: RH STG SKIN FREE OF INFECTION/BREAKDOWN Description: Manage skin free of infection/breakdown with supervision assistance Outcome: Progressing   Problem: RH SAFETY Goal: RH STG ADHERE TO SAFETY PRECAUTIONS W/ASSISTANCE/DEVICE Description: STG Adhere to Safety Precautions With  supervision Assistance/Device. Outcome: Progressing   Problem: RH PAIN MANAGEMENT Goal: RH STG PAIN MANAGED AT OR BELOW PT'S PAIN GOAL Description: <4 w/ prns Outcome: Progressing

## 2023-10-02 ENCOUNTER — Other Ambulatory Visit: Payer: Self-pay | Admitting: Family Medicine

## 2023-10-02 ENCOUNTER — Inpatient Hospital Stay (HOSPITAL_COMMUNITY)

## 2023-10-02 DIAGNOSIS — S066X9S Traumatic subarachnoid hemorrhage with loss of consciousness of unspecified duration, sequela: Secondary | ICD-10-CM

## 2023-10-02 HISTORY — PX: IR THORACENTESIS ASP PLEURAL SPACE W/IMG GUIDE: IMG5380

## 2023-10-02 LAB — BASIC METABOLIC PANEL WITH GFR
Anion gap: 12 (ref 5–15)
BUN: 97 mg/dL — ABNORMAL HIGH (ref 8–23)
CO2: 17 mmol/L — ABNORMAL LOW (ref 22–32)
Calcium: 7.6 mg/dL — ABNORMAL LOW (ref 8.9–10.3)
Chloride: 111 mmol/L (ref 98–111)
Creatinine, Ser: 3.61 mg/dL — ABNORMAL HIGH (ref 0.61–1.24)
GFR, Estimated: 16 mL/min — ABNORMAL LOW (ref >60)
Glucose, Bld: 133 mg/dL — ABNORMAL HIGH (ref 70–99)
Potassium: 5.8 mmol/L — ABNORMAL HIGH (ref 3.5–5.1)
Sodium: 140 mmol/L (ref 135–145)

## 2023-10-02 LAB — CBC WITH DIFFERENTIAL/PLATELET
Abs Immature Granulocytes: 0.1 10*3/uL — ABNORMAL HIGH (ref 0.00–0.07)
Basophils Absolute: 0 10*3/uL (ref 0.0–0.1)
Basophils Relative: 0 %
Eosinophils Absolute: 0 10*3/uL (ref 0.0–0.5)
Eosinophils Relative: 0 %
HCT: 22.8 % — ABNORMAL LOW (ref 39.0–52.0)
Hemoglobin: 7.1 g/dL — ABNORMAL LOW (ref 13.0–17.0)
Lymphocytes Relative: 13 %
Lymphs Abs: 0.6 10*3/uL — ABNORMAL LOW (ref 0.7–4.0)
MCH: 28.4 pg (ref 26.0–34.0)
MCHC: 31.1 g/dL (ref 30.0–36.0)
MCV: 91.2 fL (ref 80.0–100.0)
Metamyelocytes Relative: 1 %
Monocytes Absolute: 0.6 10*3/uL (ref 0.1–1.0)
Monocytes Relative: 14 %
Myelocytes: 1 %
Neutro Abs: 3.2 10*3/uL (ref 1.7–7.7)
Neutrophils Relative %: 70 %
Platelets: 256 10*3/uL (ref 150–400)
Promyelocytes Relative: 1 %
RBC: 2.5 MIL/uL — ABNORMAL LOW (ref 4.22–5.81)
RDW: 19 % — ABNORMAL HIGH (ref 11.5–15.5)
WBC: 4.6 10*3/uL (ref 4.0–10.5)
nRBC: 0.4 % — ABNORMAL HIGH (ref 0.0–0.2)
nRBC: 3 /100{WBCs} — ABNORMAL HIGH

## 2023-10-02 LAB — INTELLIGEN MYELOID

## 2023-10-02 LAB — PREPARE RBC (CROSSMATCH)

## 2023-10-02 MED ORDER — FUROSEMIDE 10 MG/ML IJ SOLN
40.0000 mg | Freq: Once | INTRAMUSCULAR | Status: AC
  Administered 2023-10-02: 40 mg via INTRAVENOUS
  Filled 2023-10-02: qty 4

## 2023-10-02 MED ORDER — ENSURE ENLIVE PO LIQD: 237.0000 mL | Freq: Three times a day (TID) | ORAL | Status: DC

## 2023-10-02 MED ORDER — SODIUM CHLORIDE 0.9% IV SOLUTION: Freq: Once | INTRAVENOUS | Status: DC

## 2023-10-02 MED ORDER — NEPRO/CARBSTEADY PO LIQD
237.0000 mL | Freq: Three times a day (TID) | ORAL | Status: DC
  Administered 2023-10-03 – 2023-10-08 (×12): 237 mL via ORAL

## 2023-10-02 MED ORDER — SODIUM CHLORIDE 0.9% IV SOLUTION: Freq: Once | INTRAVENOUS | Status: AC

## 2023-10-02 MED ORDER — LIDOCAINE HCL 1 % IJ SOLN
INTRAMUSCULAR | Status: AC
  Filled 2023-10-02: qty 20

## 2023-10-02 MED ORDER — MIDODRINE HCL 5 MG PO TABS
5.0000 mg | ORAL_TABLET | Freq: Two times a day (BID) | ORAL | Status: DC
  Administered 2023-10-02: 5 mg via ORAL
  Filled 2023-10-02: qty 1

## 2023-10-02 MED ORDER — LIDOCAINE HCL 1 % IJ SOLN
20.0000 mL | Freq: Once | INTRAMUSCULAR | Status: AC
  Administered 2023-10-02: 10 mL

## 2023-10-02 NOTE — Progress Notes (Signed)
 Physical Therapy Session Note  Patient Details  Name: [name] MRN: [id number] Date of Birth: Nov 01, 1940  Today's Date: 10/02/2023 PT Individual Time: 1300-1409 PT Individual Time Calculation (min): 69 min   Short Term Goals: Week 1:  PT Short Term Goal 1 (Week 1): pt will perform all transfers with LRAD and CGA PT Short Term Goal 2 (Week 1): pt will ambulate 113ft with LRAD and CGA PT Short Term Goal 3 (Week 1): pt will navigate 4 6in steps with 1 handrail and CGA  Skilled Therapeutic Interventions/Progress Updates:     Pt received supine in bed and agrees to therapy. No complaint of pain. Pt performs supine to sit with increased time required and cues for positioning at EOB. Pt performs sit to stand with cues for hand placement and CGA. Pt ambulates to toilet with CGA/minA and cues for positioning and safe cane management. Following toileting, PT provides assistance for pericare and pt ambulates to sink prior to sitting in WC. WC transport to gym. Pt stands with CGA and ambulates x130' with large based quad cane and CGA, with cues for upright gaze to improve posture and balance. Pt rates perceived exertion at 8/10 following bout of ambulation. Seated rest break. Pt stands with CGA/minA and ambulates x110' with same cues and assistance, with minA provided for stand to sit to promote increased eccentric control for safety and strengthening. Pt ambulates x25' to nustep to work on balance and endurance. Pt completes Nustep for endurance training. Pt completes x10:00 at workload of 3 and has to take multiple rest breaks during activity secondary to Lt knee pain (chronic in nature). PT provides rest breaks and gentle mobility to manage pain. Pt performs ambulatory transfer from Nustep>WC>bed with quad cane and same assistance and cues. Left supine with alarm intact and all needs within reach.   Therapy Documentation Precautions:  Precautions Precautions: Fall Required Braces or Orthoses:  Sling Restrictions Weight Bearing Restrictions Per Provider Order: Yes LUE Weight Bearing Per Provider Order: Non weight bearing Other Position/Activity Restrictions: Lt shoulder NWB, gentle ROM, sling for comfort. C2 ALL injury   Therapy/Group: Individual Therapy  Neva Barban, PT, DPT 10/02/2023, 4:01 PM

## 2023-10-02 NOTE — Progress Notes (Signed)
 Occupational Therapy Session Note  Patient Details  Name: [name] MRN: [id number] Date of Birth: 1940-07-31  Today's Date: 10/02/2023 OT Individual Time: 4098-1191 OT Individual Time Calculation (min): 58 min    Short Term Goals: Week 1:  OT Short Term Goal 1 (Week 1): STG= LTG d/t ELOS  Skilled Therapeutic Interventions/Progress Updates:    Pt received supine with no c/o pain but reporting he just had diarrhea, agreeable to OT session. He asked "can I switch to using the RW". Extensive edu/discussion re why he cannot use the RW with LUE NWB status. He is resistant and states "my shoulder was probably torn a bit before". Encouraged him to discuss risks/benefits of WB with ortho/MD. He came EOB with (S), requiring cueing to note use LUE. His HR was 121 bpm and BP 149/90. He stood with min A, sling donned with max A, and HR remained at 123 bpm. Ambulatory transfer into the bathroom with a large base quad cane with CGA. Pt often carrying cane instead of planting it. He transferred to [facility] over the toilet with CGA and completed clothing management with min A. He had void of very loose stool. He completed posterior peri hygiene with min A. He completed grooming tasks at the sink with set up assist seated. Notified MD in room rounding of tachycardia, she informed hGb is 7.1 and pt will be transfused today. Remainder of session focused on very gentle mobility d/t fatigue and low hgb. Pt completed 3x 30 ft of functional mobility with the large base quad cane and CGA. He returned to the room. He was left supine with all needs met, bed alarm set.   Therapy Documentation Precautions:  Precautions Precautions: Fall Required Braces or Orthoses: Sling Restrictions Weight Bearing Restrictions Per Provider Order: Yes LUE Weight Bearing Per Provider Order: Non weight bearing Other Position/Activity Restrictions: Lt shoulder NWB, gentle ROM, sling for comfort. C2 ALL injury  Therapy/Group: Individual  Therapy  Una Ganser 10/02/2023, 6:15 AM

## 2023-10-02 NOTE — Progress Notes (Signed)
 Met with patient to review current situation, team conference and plan of care. Reviewed medications, pain, sleep, bowel and bladder. Patient claims he does not have appetite but takes ensure, agreeable to have magic cup on his tray modified diet order to include magic cup with every meal. Per patent he does not sleep well at night. RN made aware, patient has prn melatonin but per wife he takes every night. Continue to follow along to provide educational needs to facilitate preparation for discharge.

## 2023-10-02 NOTE — Care Management (Signed)
 Inpatient Rehabilitation Center Individual Statement of Services  Patient Name:  [name]  Date:  10/02/2023  Welcome to the Inpatient Rehabilitation Center.  Our goal is to provide you with an individualized program based on your diagnosis and situation, designed to meet your specific needs.  With this comprehensive rehabilitation program, you will be expected to participate in at least 3 hours of rehabilitation therapies Monday-Friday, with modified therapy programming on the weekends.  Your rehabilitation program will include the following services:  Physical Therapy (PT), Occupational Therapy (OT), Speech Therapy (ST), 24 hour per day rehabilitation nursing, Therapeutic Recreaction (TR), Psychology, Neuropsychology, Care Coordinator, Rehabilitation Medicine, Nutrition Services, Pharmacy Services, and Other  Weekly team conferences will be held on Tuesdays to discuss your progress.  Your Inpatient Rehabilitation Care Coordinator will talk with you frequently to get your input and to update you on team discussions.  Team conferences with you and your family in attendance may also be held.  Expected length of stay: 10-12 days    Overall anticipated outcome: Supervision  Depending on your progress and recovery, your program may change. Your Inpatient Rehabilitation Care Coordinator will coordinate services and will keep you informed of any changes. Your Inpatient Rehabilitation Care Coordinator's name and contact numbers are listed  below.  The following services may also be recommended but are not provided by the Inpatient Rehabilitation Center:  Driving Evaluations Home Health Rehabiltiation Services Outpatient Rehabilitation Services Vocational Rehabilitation   Arrangements will be made to provide these services after discharge if needed.  Arrangements include referral to agencies that provide these services.  Your insurance has been verified to be:  Medicare A/B  Your primary  doctor is:  Terre Ferri  Pertinent information will be shared with your doctor and your insurance company.  Inpatient Rehabilitation Care Coordinator:  Kathey Pang [phone] or [phone]  Information discussed with and copy given to patient by: Rennis Case, 10/02/2023, 12:12 PM

## 2023-10-02 NOTE — Progress Notes (Signed)
 Inpatient Rehabilitation  Patient information reviewed and entered into eRehab system by Feliberto Gottron, M.A., CCC-SLP, Rehab Quality Coordinator.  Information including medical coding, functional ability and quality indicators will be reviewed and updated through discharge.

## 2023-10-02 NOTE — IPOC Note (Signed)
 Overall Plan of Care [facility]) Patient Details Name: [name] MRN: [id number] DOB: April 16, 1941  Admitting Diagnosis: Traumatic subarachnoid hemorrhage [facility])  Hospital Problems: Principal Problem:   Traumatic subarachnoid hemorrhage (HCC)     Functional Problem List: Nursing Bladder, Bowel, Endurance, Medication Management, Nutrition, Pain, Edema, Perception, Safety  PT Balance, Endurance, Nutrition, Safety, Perception, Skin Integrity, Edema  OT Balance, Motor, Endurance, Safety  SLP    TR         Basic ADL's: OT Bathing, Dressing, Toileting     Advanced  ADL's: OT       Transfers: PT Bed Mobility, Bed to Chair, Car, Occupational psychologist, Research scientist (life sciences): PT Ambulation, Psychologist, prison and probation services, Stairs     Additional Impairments: OT None  SLP Social Cognition   Problem Solving, Memory, Awareness  TR      Anticipated Outcomes Item Anticipated Outcome  Self Feeding no goal  Swallowing      Basic self-care  (S)- mod I  Toileting  (S)   Bathroom Transfers (S)  Bowel/Bladder  manage bowels with medications/ manage bladder with time toileting  Transfers  Mod I with LRAD  Locomotion  supervision with LRAD  Communication     Cognition  supervision A  Pain  <4 w/ prns  Safety/Judgment  manage safety with supervision   Therapy Plan: PT Intensity: Minimum of 1-2 x/day ,45 to 90 minutes PT Frequency: 5 out of 7 days PT Duration Estimated Length of Stay: 10-12 days OT Intensity: Minimum of 1-2 x/day, 45 to 90 minutes OT Frequency: 5 out of 7 days OT Duration/Estimated Length of Stay: 10-12 days SLP Intensity: Minumum of 1-2 x/day, 30 to 90 minutes SLP Frequency: 3 to 5 out of 7 days SLP Duration/Estimated Length of Stay: 10-12 days   Team Interventions: Nursing Interventions Patient/Family Education, Bladder Management, Bowel Management, Medication Management, Pain Management, Disease Management/Prevention, Skin Care/Wound Management, Discharge  Planning  PT interventions Ambulation/gait training, Discharge planning, Functional mobility training, Psychosocial support, Therapeutic Activities, Visual/perceptual remediation/compensation, Balance/vestibular training, Disease management/prevention, Neuromuscular re-education, Skin care/wound management, Therapeutic Exercise, Wheelchair propulsion/positioning, Cognitive remediation/compensation, DME/adaptive equipment instruction, Pain management, Splinting/orthotics, UE/LE Strength taining/ROM, Firefighter, Equities trader education, Museum/gallery curator, UE/LE Coordination activities  OT Interventions Warden/ranger, Discharge planning, Pain management, Self Care/advanced ADL retraining, Therapeutic Activities, UE/LE Coordination activities, Disease mangement/prevention, Functional mobility training, Patient/family education, Therapeutic Exercise, Community reintegration, Fish farm manager, UE/LE Strength taining/ROM  SLP Interventions Cognitive remediation/compensation, Internal/external aids, Cueing hierarchy, Therapeutic Activities, Functional tasks, Patient/family education  TR Interventions    SW/CM Interventions Discharge Planning, Psychosocial Support, Patient/Family Education   Barriers to Discharge MD  Medical stability  Nursing Decreased caregiver support, Home environment access/layout, Incontinence Discharge: House  Discharge Home Layout: Two level, Able to live on main level with bedroom/bathroom  Alternate Level Stairs-Rails: Right  Alternate Level Stairs-Number of Steps: 7  Discharge Home Access: Ramped entrance  PT Home environment access/layout, Weight bearing restrictions, Other (comments) NWB LUE, split level home with steps  OT      SLP      SW Decreased caregiver support, Lack of/limited family support     Team Discharge Planning: Destination: PT-Home ,OT- Home , SLP-Home Projected Follow-up: PT-Outpatient PT, OT-  Outpatient OT,  SLP-None Projected Equipment Needs: PT-To be determined, OT- To be determined, SLP-None recommended by SLP Equipment Details: PT-has SBQC and RW, OT-  Patient/family involved in discharge planning: PT- Patient,  OT-Patient, SLP-Patient  MD ELOS: 10-12 days Medical Rehab Prognosis:  Excellent  Assessment: The patient has been admitted for CIR therapies with the diagnosis of traumatic SDH. The team will be addressing functional mobility, strength, stamina, balance, safety, adaptive techniques and equipment, self-care, bowel and bladder mgt, patient and caregiver education, NMR, cognition/behavior, community reintegration. Goals have been set at supervision to mod I. Anticipated discharge destination is home with family.        See Team Conference Notes for weekly updates to the plan of care

## 2023-10-02 NOTE — Progress Notes (Addendum)
 Initial Nutrition Assessment  DOCUMENTATION CODES:   Non-severe (moderate) malnutrition in context of acute illness/injury  INTERVENTION:   Liberalize diet to regular, room service yes with assistance. Nutritional Services ambassador to assist patient with meal ordering and preferences. Ensure Enlive po TID, each supplement provides 350 kcal and 20 grams of protein. Chocolate Magic cup TID with meals, each supplement provides 290 kcal and 9 grams of protein. Continue MVI with minerals daily.  NUTRITION DIAGNOSIS:   Moderate Malnutrition related to acute illness (traumatic SAH) as evidenced by mild fat depletion, mild muscle depletion.  GOAL:   Patient will meet greater than or equal to 90% of their needs  MONITOR:   PO intake, Supplement acceptance  REASON FOR ASSESSMENT:   Consult Assessment of nutrition requirement/status  ASSESSMENT:   83 yo male admitted with functional deficit d/t traumatic SAH, L AC joint disruption s/p fall on admission. PMH includes A fib, HTN, HLD, hypothyroidism, anemia, R pleural effusion, CKD stage IV, CVA.  4/18 S/P R thoracentesis w/ 1,650 ml fluid removed (during acute hospitalization).  Currently on a soft diet. He has consumed 0% of breakfast and lunch today. Meal intakes 4/26 documented at 85-100%.   Patient and his family reports that patient has been eating poorly for the past 1.5 weeks since admission to the hospital. He does not like the hospital food and has a poor appetite. He has asked for certain foods, such as Raisin Bran cereal, and is unable to get it on a soft diet. RD spoke with rehab PA, who okayed changing diet to regular instead of soft. Changed room service status to yes with assistance so nutritional services ambassador will help patient with meal ordering. He likes Ensure supplements and has been drinking them 2-3 times per day.   Labs reviewed.  Medications reviewed and include decadron , lasix , MVI with minerals,  protonix , miralax , senokot-s.  Weight history reviewed. Patient with 10% weight loss within the past 6 months. Patient meets criteria for moderate malnutrition, given mild depletion of muscle and subcutaneous fat mass.   NUTRITION - FOCUSED PHYSICAL EXAM:  Flowsheet Row Most Recent Value  Orbital Region No depletion  Upper Arm Region Mild depletion  Thoracic and Lumbar Region Mild depletion  Buccal Region No depletion  Temple Region Mild depletion  Clavicle Bone Region Mild depletion  Clavicle and Acromion Bone Region Mild depletion  Scapular Bone Region Mild depletion  Dorsal Hand Mild depletion  Patellar Region Unable to assess  Anterior Thigh Region Unable to assess  Posterior Calf Region No depletion  Edema (RD Assessment) Mild  Hair Reviewed  Eyes Reviewed  Mouth Reviewed  Skin Reviewed  Nails Reviewed       Diet Order:   Diet Order             Diet regular Room service appropriate? Yes with Assist; Fluid consistency: Thin  Diet effective now                   EDUCATION NEEDS:   Not appropriate for education at this time  Skin:  Skin Assessment: Reviewed RN Assessment (abrasion to L forehead)  Last BM:  4/28 type 6  Height:   Ht Readings from Last 1 Encounters:  09/21/23 5\' 11"  (1.803 m)    Weight:   Wt Readings from Last 1 Encounters:  09/29/23 73 kg    Ideal Body Weight:  78.2 kg  BMI:  Body mass index is 22.45 kg/m.  Estimated Nutritional Needs:   Kcal:  1900-2100  Protein:  100-125 gm  Fluid:  1.9-2.1 L   Barnet Boots RD, LDN, CNSC Contact via secure chat. If unavailable, use group chat "RD Inpatient."

## 2023-10-02 NOTE — Progress Notes (Addendum)
 Has been on IVF since admission for acute on chronic renal failure. BMET ordered. Was transfused with one unit PRBC today--dose of lasix  after transfusion. D/c IVF as CXR earlier today showed recurrent pleural effusion requiring thoracocentesis of 825 cc of dark amber fluid.   --Per last nephrology note " SCr should return to baseline and does not appear super dry--to encourage drink to thirst"  Baseline SCr 3-4 range.  Will order lytes every other day for now. Daily weight ordered--> has  gone up from 160.9  to 171 today.  ( Admission weight on 4/17 was 159.7 lbs). Has had hyperkalemia--> changed Ensure to nephro.

## 2023-10-02 NOTE — Progress Notes (Signed)
 PROGRESS NOTE   Subjective/Complaints:  BP elevated overnight. HR 102 thid AM.  No BMP since 4-26; a.m. CBC significant for stable WBC 4.6 and slow downtrend in hemoglobin to 7.1 today.  ROS- neg CP, SOB, N/V/D  Objective:   No results found. Recent Labs    10/01/23 0658 10/02/23 0523  WBC 4.7 4.6  HGB 7.6* 7.1*  HCT 24.4* 22.8*  PLT 241 256   Recent Labs    09/30/23 0235  NA 138  K 5.1  CL 109  CO2 16*  GLUCOSE 134*  BUN 83*  CREATININE 4.57*  CALCIUM  7.4*    Intake/Output Summary (Last 24 hours) at 10/02/2023 4098 Last data filed at 10/02/2023 1191 Gross per 24 hour  Intake 240 ml  Output 1300 ml  Net -1060 ml     Pressure Injury 09/22/23 Face Left;Upper abrasion above left eye brow (Active)  09/22/23 1940  Location: Face  Location Orientation: Left;Upper  Staging:   Wound Description (Comments): abrasion above left eye brow  Present on Admission: Yes    Physical Exam: Vital Signs Blood pressure (!) 154/99, pulse (!) 102, temperature 97.7 F (36.5 C), temperature source Oral, resp. rate 18, weight 73 kg, SpO2 98%.  General: No acute distress Mood and affect are appropriate Heart: Regular rate and rhythm no rubs murmurs or extra sounds Lungs: Clear to auscultation, breathing unlabored, no rales or wheezes Abdomen: Positive bowel sounds, soft nontender to palpation, nondistended Extremities: No clubbing, cyanosis, or edema Skin: No evidence of breakdown, no evidence of rash Neurologic: Cranial nerves II through XII intact, motor strength is 4/5 in bilateral deltoid, bicep, tricep, grip, hip flexor, knee extensors, ankle dorsiflexor and plantar flexor  Musculoskeletal: Full range of motion in all 4 extremities. No joint swelling No tenderness to palpation Left shoulder , no pain with ROM    Assessment/Plan: 1. Functional deficits which require 3+ hours per day of interdisciplinary therapy in a  comprehensive inpatient rehab setting. Physiatrist is providing close team supervision and 24 hour management of active medical problems listed below. Physiatrist and rehab team continue to assess barriers to discharge/monitor patient progress toward functional and medical goals  Care Tool:  Bathing    Body parts bathed by patient: Right arm, Chest, Left arm, Abdomen, Front perineal area, Buttocks, Right upper leg, Left upper leg, Right lower leg, Left lower leg, Face         Bathing assist Assist Level: Moderate Assistance - Patient 50 - 74%     Upper Body Dressing/Undressing Upper body dressing   What is the patient wearing?: Pull over shirt    Upper body assist Assist Level: Supervision/Verbal cueing    Lower Body Dressing/Undressing Lower body dressing      What is the patient wearing?: Pants, Underwear/pull up     Lower body assist Assist for lower body dressing: Moderate Assistance - Patient 50 - 74%     Toileting Toileting    Toileting assist Assist for toileting: Moderate Assistance - Patient 50 - 74%     Transfers Chair/bed transfer  Transfers assist     Chair/bed transfer assist level: Minimal Assistance - Patient > 75%  Locomotion Ambulation   Ambulation assist      Assist level: Minimal Assistance - Patient > 75% Assistive device: Cane-quad Max distance: 82ft   Walk 10 feet activity   Assist     Assist level: Minimal Assistance - Patient > 75% Assistive device: Cane-quad   Walk 50 feet activity   Assist    Assist level: Minimal Assistance - Patient > 75% Assistive device: Cane-quad    Walk 150 feet activity   Assist Walk 150 feet activity did not occur: Safety/medical concerns (fatigue)         Walk 10 feet on uneven surface  activity   Assist     Assist level: Minimal Assistance - Patient > 75% Assistive device: Other (comment), Cane-quad (L handrail)   Wheelchair     Assist Is the patient using a  wheelchair?: Yes Type of Wheelchair: Manual    Wheelchair assist level: Dependent - Patient 0% Max wheelchair distance: 175ft    Wheelchair 50 feet with 2 turns activity    Assist        Assist Level: Dependent - Patient 0%   Wheelchair 150 feet activity     Assist      Assist Level: Dependent - Patient 0%   Blood pressure (!) 154/99, pulse (!) 102, temperature 97.7 F (36.5 C), temperature source Oral, resp. rate 18, weight 73 kg, SpO2 98%.  Medical Problem List and Plan: 1. Functional deficits secondary to traumatic SAH fall             -patient may  shower             -ELOS/Goals: PT/OT/SLP Supervision, 10-12 days             ` Stable to continue CIR  2.  Antithrombotics: -DVT/anticoagulation:  Mechanical: Antiembolism stockings, thigh (TED hose) Bilateral lower extremities.  Chronic Eliquis  currently on hold due to [facility]             -antiplatelet therapy: N/A 3. Pain Management: LidoDerm  patch, Robaxin  as needed, tramadol  as needed 4. Mood/Behavior/Sleep: Provide emotional support             -antipsychotic agents: Seroquel  25 mg nightly 5. Neuropsych/cognition: This patient is capable of making decisions on his own behalf. 6. Skin/Wound Care: Routine skin checks 7. Fluids/Electrolytes/Nutrition: Routine ins and outs with follow-up chemistries  - 4-28: Very poor p.o. intakes over the weekend, 0% of meals and only 240 cc of p.o. intakes yesterday.  Will get nutrition consult to help evaluate today.  8.  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 Neurosurgery follow-up no current plan for surgical intervention however patient is currently maintained on Decadron  therapy for pain control. 9.  Recurrent large right pleural effusion.  Status post ultrasound thoracentesis 1.6 L fluid removed. 10.  PAF.  Rate  controlled.  Continue amiodarone .  Eliquis  on hold due to [facility]. 11.  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  Follow-up nephrology services.  Renal ultrasound no hydronephrosis.  Currently no plans for HD. Avoid nephrotoxins. Monitor labs. Maintain MAP > 65 12.  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  Follow-up orthopedic services Dr. Abigail Abler.   13.  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  Followed by oncology services [name].  Prior to admission patient was receiving Revlimid  5 mg mouth daily for 21 days then hold for 7 days repeating  every 28 days  4/28: Hemoglobin steadily downtrending to 7.1 today; will get H&H tomorrow a.m. and cross/type today in anticipation of need for PRBCs  14.  Hypothyroidism.  Synthroid  15.  Hyperlipidemia.  Zocor  16.  History of BPH.  Check PVR 17.  GERD.  Protonix  18.  Hypotension.  Followed by renal services with plan to slowly wean ProAmatine . Vitals:   10/02/23 0406 10/02/23 0411  BP: (!) 177/101 (!) 154/99  Pulse: 84 (!) 102  Resp: 18   Temp: 97.7 F (36.5 C) 97.7 F (36.5 C)  SpO2: 98% 98%   Supine systolic BPs mildly elevated will order ortho vitals to see if midodrine  can be reduced  -4/28: Severe hypertension overnight.  Reduce midodrine  to 5 mg twice daily at 8 AM and noon, get orthostatic vitals today. Encourage PO fluids  19. Occasional cough, reports started couple hours ago             -Start robitussin              -Repeat Xray chest if does not improve 20. Leukopenia- improved today              -Oncology s/p  GCSF, CBC daily    Latest Ref Rng & Units 10/02/2023    5:23 AM 10/01/2023    6:58 AM 09/30/2023    2:35 AM  CBC  WBC 4.0 - 10.5 K/uL 4.6  4.7  2.5   Hemoglobin 13.0 - 17.0 g/dL 7.1  7.6  7.6   Hematocrit 39.0 - 52.0 % 22.8  24.4  24.7   Platelets 150 - 400 K/uL 256  241  236     21. L knee OA             -Xray nov 2024 indicates moderate OA             -Muscle rub prn     22.  Left AC  joint disruption - Nonweightbearing left upper extremity with shoulder sling for comfort.    LOS: 3 days A FACE TO FACE EVALUATION WAS PERFORMED  Bea Lime 10/02/2023, 8:39 AM

## 2023-10-02 NOTE — Progress Notes (Signed)
 Patient ID: [name], male   DOB: 1940-12-23, 83 y.o.   MRN: [id number]  SW made efforts to meet with pt to complete assessment, but pt off the floor due to procedure. SW will follow-up to complete assessment.   Norval Been, MSW, LCSW Office: [phone] Cell: [phone] Fax: [phone]

## 2023-10-02 NOTE — Progress Notes (Signed)
 Speech Language Pathology Daily Session Note  Patient Details  Name: [name] MRN: [id number] Date of Birth: 1940-06-22  Today's Date: 10/02/2023 SLP Individual Time: 1030-1130 SLP Individual Time Calculation (min): 60 min  Short Term Goals: Week 1: SLP Short Term Goal 1 (Week 1): Patient will recall and utilize memory compensatory aids given min multimodal A SLP Short Term Goal 2 (Week 1): Patient will demonstrate functional problem solving skills in mildly complex situations given min multimodal A SLP Short Term Goal 3 (Week 1): Patient will recall cognitive and physical changes since admission given min multimodal A  Skilled Therapeutic Interventions:   Pt greeted at bedside. He was overall cooperative throughout tx tasks targeting cognition. He was Aox4 and able to recall broad details re recent events, such as need for a blood transfusion this afternoon. He benefited from [facility] for more detailed information. During initial conversation re IADL management, PLOF, and home life, he required only supA for complex verbal problem solving. However, anticipate that baseline personality negatively impacts awareness/planning in addition to true potential deficits. He also completed a verbal task adding one item to a category and benefited from supervisionA for working memory and selective attention given noisy external environment. He also completed a menu task targeting money management and organization. He required SLP assistance to use the calculator, but completed the task independently otherwise. Additional processing time required as well. At the end of tx tasks, he was left in his bed with the alarm set and call light within reach. His wife was present at bedside as well. Recommend cont ST per POC at this time.    Pain Pain Assessment Pain Scale: 0-10 Pain Score: 0-No pain  Therapy/Group: Individual Therapy  Rozell Cornet 10/02/2023, 1:00 PM

## 2023-10-02 NOTE — Plan of Care (Signed)
  Problem: RH BOWEL ELIMINATION Goal: RH STG MANAGE BOWEL WITH ASSISTANCE Description: STG Manage Bowel with  supervision Assistance. Outcome: Progressing   Problem: RH BLADDER ELIMINATION Goal: RH STG MANAGE BLADDER WITH ASSISTANCE Description: STG Manage Bladder With supervision Assistance Outcome: Progressing   Problem: RH SKIN INTEGRITY Goal: RH STG SKIN FREE OF INFECTION/BREAKDOWN Description: Manage skin free of infection/breakdown with supervision assistance Outcome: Progressing   Problem: RH SAFETY Goal: RH STG ADHERE TO SAFETY PRECAUTIONS W/ASSISTANCE/DEVICE Description: STG Adhere to Safety Precautions With  supervision Assistance/Device. Outcome: Progressing   Problem: RH PAIN MANAGEMENT Goal: RH STG PAIN MANAGED AT OR BELOW PT'S PAIN GOAL Description: <4 w/ prns Outcome: Progressing

## 2023-10-03 ENCOUNTER — Inpatient Hospital Stay (HOSPITAL_COMMUNITY)

## 2023-10-03 DIAGNOSIS — I480 Paroxysmal atrial fibrillation: Secondary | ICD-10-CM

## 2023-10-03 DIAGNOSIS — E440 Moderate protein-calorie malnutrition: Secondary | ICD-10-CM | POA: Insufficient documentation

## 2023-10-03 DIAGNOSIS — S066X9S Traumatic subarachnoid hemorrhage with loss of consciousness of unspecified duration, sequela: Secondary | ICD-10-CM | POA: Diagnosis not present

## 2023-10-03 LAB — BASIC METABOLIC PANEL WITH GFR
Anion gap: 10 (ref 5–15)
BUN: 98 mg/dL — ABNORMAL HIGH (ref 8–23)
CO2: 19 mmol/L — ABNORMAL LOW (ref 22–32)
Calcium: 7.6 mg/dL — ABNORMAL LOW (ref 8.9–10.3)
Chloride: 110 mmol/L (ref 98–111)
Creatinine, Ser: 3.71 mg/dL — ABNORMAL HIGH (ref 0.61–1.24)
GFR, Estimated: 15 mL/min — ABNORMAL LOW (ref >60)
Glucose, Bld: 88 mg/dL (ref 70–99)
Potassium: 5.3 mmol/L — ABNORMAL HIGH (ref 3.5–5.1)
Sodium: 139 mmol/L (ref 135–145)

## 2023-10-03 LAB — D-DIMER, QUANTITATIVE: D-Dimer, Quant: 1.75 ug{FEU}/mL — ABNORMAL HIGH (ref 0.00–0.50)

## 2023-10-03 LAB — CBC WITH DIFFERENTIAL/PLATELET
Abs Immature Granulocytes: 0.59 10*3/uL — ABNORMAL HIGH (ref 0.00–0.07)
Basophils Absolute: 0.1 10*3/uL (ref 0.0–0.1)
Basophils Relative: 1 %
Eosinophils Absolute: 0 10*3/uL (ref 0.0–0.5)
Eosinophils Relative: 0 %
HCT: 28.2 % — ABNORMAL LOW (ref 39.0–52.0)
Hemoglobin: 8.9 g/dL — ABNORMAL LOW (ref 13.0–17.0)
Immature Granulocytes: 11 %
Lymphocytes Relative: 24 %
Lymphs Abs: 1.3 10*3/uL (ref 0.7–4.0)
MCH: 28.2 pg (ref 26.0–34.0)
MCHC: 31.6 g/dL (ref 30.0–36.0)
MCV: 89.2 fL (ref 80.0–100.0)
Monocytes Absolute: 0.7 10*3/uL (ref 0.1–1.0)
Monocytes Relative: 14 %
Neutro Abs: 2.7 10*3/uL (ref 1.7–7.7)
Neutrophils Relative %: 50 %
Platelets: 269 10*3/uL (ref 150–400)
RBC: 3.16 MIL/uL — ABNORMAL LOW (ref 4.22–5.81)
RDW: 18.5 % — ABNORMAL HIGH (ref 11.5–15.5)
WBC: 5.3 10*3/uL (ref 4.0–10.5)
nRBC: 0.4 % — ABNORMAL HIGH (ref 0.0–0.2)

## 2023-10-03 LAB — TYPE AND SCREEN
ABO/RH(D): O POS
Antibody Screen: NEGATIVE
Unit division: 0

## 2023-10-03 LAB — BPAM RBC
Blood Product Expiration Date: 202505252359
ISSUE DATE / TIME: 202504281645
Unit Type and Rh: 5100

## 2023-10-03 MED ORDER — METOPROLOL TARTRATE 12.5 MG HALF TABLET
12.5000 mg | ORAL_TABLET | Freq: Two times a day (BID) | ORAL | Status: DC
  Administered 2023-10-03 (×2): 12.5 mg via ORAL
  Filled 2023-10-03 (×3): qty 1

## 2023-10-03 MED ORDER — MIDODRINE HCL 5 MG PO TABS
2.5000 mg | ORAL_TABLET | Freq: Two times a day (BID) | ORAL | Status: DC
Start: 2023-10-03 — End: 2023-10-03
  Filled 2023-10-03: qty 1

## 2023-10-03 MED ORDER — SODIUM ZIRCONIUM CYCLOSILICATE 5 G PO PACK
5.0000 g | PACK | Freq: Once | ORAL | Status: AC
  Administered 2023-10-03: 5 g via ORAL
  Filled 2023-10-03: qty 1

## 2023-10-03 NOTE — Patient Care Conference (Signed)
 Inpatient RehabilitationTeam Conference and Plan of Care Update Date: 10/03/2023   Time: 1024 am    Patient Name: [name]      Medical Record Number: [id number]  Date of Birth: 10/29/1940 Sex: Male         Room/Bed: 4W24C/4W24C-01 Payor Info: Payor: MEDICARE / Plan: MEDICARE PART A AND B / Product Type: *No Product type* /    Admit Date/Time:  09/29/2023  5:20 PM  Primary Diagnosis:  Traumatic subarachnoid hemorrhage [facility])  Hospital Problems: Principal Problem:   Traumatic subarachnoid hemorrhage (HCC) Active Problems:   Malnutrition of moderate degree   PAF (paroxysmal atrial fibrillation) [facility]    Expected Discharge Date: Expected Discharge Date: 10/10/23  Team Members Present: Physician leading conference: Dr. Cherri Corns Social Worker Present: Norval Been, LCSWA Nurse Present: Jerene Monks, RN PT Present: Theodis Fiscal, PT OT Present: Eilene Grater, OT SLP Present: Other (comment) Tally Faes , SLP) PPS Coordinator present : Jestine Moron, SLP     Current Status/Progress Goal Weekly Team Focus  Bowel/Bladder   Continent B/B with bowel incontinence episode  LBM 4/29   Will regain continence with normal pattern   Assist with toilet needs qshift/prn    Swallow/Nutrition/ Hydration               ADL's   (S) UB ADLs, min A LB, CGA- min A ADL transfers with large base quad cane. poor adherence to LUE NWB   Supervision   ADLs, transfers, safety, balance    Mobility   supervision bed mobility, minA transfers, CGA/minA           Communication   [facility]            Safety/Cognition/ Behavioral Observations  mild cognitive deficits (memory/reasoning/awareness) vs slight deficits and baseline personality   supervisionA   pt/family edu, complex problem solving/reaosning, planning, error awareness    Pain   Denies pain at this time   Will be free from pain   Assess pain qshift/prn    Skin   Skin is intact   Will maintain skin intergrity with no  breakdown  Assess skin qshift/prn and provide education to prevent breakdown      Discharge Planning:  TBA. Pt will d/c to home with his wife, and physical support from their son who is IDD. SW will confirm there are no barriers to discharge.    Team Discussion: Patient was admitted post fall with SAH. Patient has cough, high potassium level, hypotension/ hypertension:medications adjusted by MD. Patient limited cognitive deficits, poor po intake, recurrent pleural effusion, tachycardia, fatigue due to low hemoglobin, mild memory deficits and flat affect.  Patient on target to meet rehab goals: yes, Patient requires supervision with upper body care, minimal assistance with lower body care and contact guard assistance with transfers using a large base quad cane. Overall goals are set for supervision at discharge.   *See Care Plan and progress notes for long and short-term goals.   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 intakes  Barriers to Discharge: Self-care education;Electrolyte abnormality;Inadequate Nutritional Intake;Medical stability;Volume Overload   Possible Resolutions to [employer] Focus: Transfuse as  needed, titrate blood pressure medications, monitor electrolytes, encourage nutritional intakes, monitor volume status, monitor pleural effusion and tap as needed   Continued Need for Acute Rehabilitation Level of Care: The patient requires daily medical management by a  physician with specialized training in physical medicine and rehabilitation for the following reasons: Direction of a multidisciplinary physical rehabilitation program to maximize functional independence : Yes Medical management of patient stability for increased activity during participation in an intensive rehabilitation regime.: Yes Analysis of laboratory values and/or radiology reports with any subsequent need for medication adjustment and/or medical intervention. : Yes   I attest that I was present, lead the team conference, and concur with the assessment and plan of the team.   Jerene Monks 10/03/2023, 1024 am

## 2023-10-03 NOTE — Progress Notes (Signed)
 PROGRESS NOTE   Subjective/Complaints:  Ongoing tachycardia, feeling a little better today. Having some chilliness but no feves, cough improved.  Incontinent of bowels. Continent of urine.  K better today 5.3; BUN/Cr stable. Responsive to blood transfusion yesterday 8.9 today.    ROS: Positives per HPI above. Denies fevers, chills, N/V, abdominal pain, chest pain, new weakness or paraesthesias.   Positive for generalized fatigue Positive for shortness of breath Positive for tachycardia  Objective:   DG Chest 1 View Result Date: 10/02/2023 CLINICAL DATA:  Status post thoracentesis EXAM: CHEST  1 VIEW COMPARISON:  10/02/2023 FINDINGS: Normal mediastinum and cardiac silhouette. Normal pulmonary vasculature. No evidence of effusion, infiltrat no evidence of pneumothorax following RIGHT thoracentesis. RIGHT mid chest opacity again noted. There is volume loss in the RIGHT hemithorax similar to prior. IMPRESSION: No pneumothorax following thoracentesis. Electronically Signed   By: Deboraha Fallow M.D.   On: 10/02/2023 15:59   IR THORACENTESIS ASP PLEURAL SPACE W/IMG GUIDE Result Date: 10/02/2023 INDICATION: 83 year old male with recurrent right pleural effusion. Request for therapeutic thoracentesis. EXAM: ULTRASOUND GUIDED THERAPEUTIC THORACENTESIS MEDICATIONS: 6 cc of 1% lidocaine  COMPLICATIONS: None immediate. PROCEDURE: An ultrasound guided thoracentesis was thoroughly discussed with the patient and questions answered. The benefits, risks, alternatives and complications were also discussed. The patient understands and wishes to proceed with the procedure. Written consent was obtained. Ultrasound was performed to localize and mark an adequate pocket of fluid in the right chest. The area was then prepped and draped in the normal sterile fashion. 1% Lidocaine  was used for local anesthesia. Under ultrasound guidance a 6 Fr Safe-T-Centesis  catheter was introduced. Thoracentesis was performed. The catheter was removed and a dressing applied. FINDINGS: A total of approximately 825 cc of clear, dark amber pleural fluid was removed. IMPRESSION: Successful ultrasound guided right thoracentesis yielding 825 cc of pleural fluid. Procedure performed by Lambert Pillion, PA-C Electronically Signed   By: Nicoletta Barrier M.D.   On: 10/02/2023 15:39   DG Chest 2 View Result Date: 10/02/2023 CLINICAL DATA:  Cough. EXAM: CHEST - 2 VIEW COMPARISON:  Chest radiograph dated 09/22/2023. FINDINGS: Right pleural effusion with partial compressive atelectasis of the right lung, progressed since the prior radiograph. And LL airspace density in the right mid lung field may represent atelectasis or infiltrate. The left lung is clear. No pneumothorax. Stable cardiomegaly. Degenerative changes of the spine. No acute osseous pathology. IMPRESSION: 1. Right pleural effusion with partial compressive atelectasis of the right lung. 2. Right mid lung field atelectasis versus infiltrate. Electronically Signed   By: Angus Bark M.D.   On: 10/02/2023 13:26   Recent Labs    10/02/23 0523 10/03/23 0709  WBC 4.6 5.3  HGB 7.1* 8.9*  HCT 22.8* 28.2*  PLT 256 269   Recent Labs    10/02/23 2024 10/03/23 0709  NA 140 139  K 5.8* 5.3*  CL 111 110  CO2 17* 19*  GLUCOSE 133* 88  BUN 97* 98*  CREATININE 3.61* 3.71*  CALCIUM  7.6* 7.6*    Intake/Output Summary (Last 24 hours) at 10/03/2023 1025 Last data filed at 10/03/2023 0524 Gross per 24 hour  Intake 338 ml  Output 1650 ml  Net -1312 ml         Physical Exam: Vital Signs Blood pressure (!) 166/85, pulse (!) 104, temperature 98.7 F (37.1 C), resp. rate (!) 24, height 5\' 11"  (1.803 m), weight 76.4 kg, SpO2 98%.  General: No acute distress.  Laying in bed.  Mood and affect are appropriate Heart: Tahcycardia 120s at rest. Regular rhythm. No m/r/g.  Lungs: Mild L basilar crackles, reduced basilar lung sounds  on R, mild expiratory wheezes.  Abdomen: Positive bowel sounds, soft nontender to palpation, nondistended Extremities: No clubbing, cyanosis, or edema Skin: No evidence of breakdown, no evidence of rash Neurologic: Cranial nerves II through XII intact, motor strength is 5-/5 throughout, no apparent sensory deficits. Musculoskeletal: Full range of motion in all 4 extremities. No joint swelling. No calf tenderness.     Assessment/Plan: 1. Functional deficits which require 3+ hours per day of interdisciplinary therapy in a comprehensive inpatient rehab setting. Physiatrist is providing close team supervision and 24 hour management of active medical problems listed below. Physiatrist and rehab team continue to assess barriers to discharge/monitor patient progress toward functional and medical goals  Care Tool:  Bathing    Body parts bathed by patient: Right arm, Chest, Left arm, Abdomen, Front perineal area, Buttocks, Right upper leg, Left upper leg, Right lower leg, Left lower leg, Face         Bathing assist Assist Level: Contact Guard/Touching assist     Upper Body Dressing/Undressing Upper body dressing   What is the patient wearing?: Pull over shirt    Upper body assist Assist Level: Supervision/Verbal cueing    Lower Body Dressing/Undressing Lower body dressing      What is the patient wearing?: Pants, Underwear/pull up     Lower body assist Assist for lower body dressing: Contact Guard/Touching assist     Toileting Toileting    Toileting assist Assist for toileting: Contact Guard/Touching assist     Transfers Chair/bed transfer  Transfers assist     Chair/bed transfer assist level: Contact Guard/Touching assist     Locomotion Ambulation   Ambulation assist      Assist level: Contact Guard/Touching assist Assistive device: Cane-quad Max distance: 130'   Walk 10 feet activity   Assist     Assist level: Contact Guard/Touching  assist Assistive device: Cane-quad   Walk 50 feet activity   Assist    Assist level: Contact Guard/Touching assist Assistive device: Cane-quad    Walk 150 feet activity   Assist Walk 150 feet activity did not occur: Safety/medical concerns (fatigue)         Walk 10 feet on uneven surface  activity   Assist     Assist level: Minimal Assistance - Patient > 75% Assistive device: Other (comment), Cane-quad (L handrail)   Wheelchair     Assist Is the patient using a wheelchair?: Yes Type of Wheelchair: Manual    Wheelchair assist level: Dependent - Patient 0% Max wheelchair distance: 168ft    Wheelchair 50 feet with 2 turns activity    Assist        Assist Level: Dependent - Patient 0%   Wheelchair 150 feet activity     Assist      Assist Level: Dependent - Patient 0%   Blood pressure (!) 166/85, pulse (!) 104, temperature 98.7 F (37.1 C), resp. rate (!) 24, height 5\' 11"  (1.803 m), weight 76.4 kg, SpO2 98%.  Medical Problem List and Plan: 1. Functional deficits secondary to traumatic SAH fall             -  patient may  shower             -ELOS/Goals: PT/OT/SLP Supervision, 10-12 days - 10/10/23 DC             ` Stable to continue CIR  4/19: Poor adherence to LUE NWB, otherwise SPV OT. Low-120s HR with standing and walking. Some cog deficits c/b personality.   2.  Antithrombotics: -DVT/anticoagulation:  Mechanical: Antiembolism stockings, thigh (TED hose) Bilateral lower extremities.  Chronic Eliquis  currently on hold [facility] to [facility]             -antiplatelet therapy: N/A  -4-29: With ongoing tachycardia and shortness of breath off of AC, need to rule out PE.  Discussed with cardiology and nephrology, will get D-dimer --> nuc med scan if positive.  Will also order bilateral lower extremity duplex  3. Pain Management: LidoDerm  patch, Robaxin  as needed, tramadol  as needed 4. Mood/Behavior/Sleep: Provide emotional support             -antipsychotic  agents: Seroquel  25 mg nightly 5. Neuropsych/cognition: This patient is capable of making decisions on his own behalf. 6. Skin/Wound Care: Routine skin checks 7. Fluids/Electrolytes/Nutrition: Routine ins and outs with follow-up chemistries  - 4-28: Very poor p.o. intakes over the weekend, 0% of meals and only 240 cc of p.o. intakes yesterday.  Will get nutrition consult to help evaluate today.  8.  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 Neurosurgery follow-up no current plan for surgical intervention however patient is currently maintained on Decadron  therapy for pain control. 9.  Recurrent large right pleural effusion.  Status post ultrasound thoracentesis 1.6 L fluid removed.  - 4-28: Chest x-ray today with recurrence, IR tapped for 825 cc of dark amber fluid.  Follow-up chest x-ray improved.  4/29: CXR for ongoing crackles and opacity yesterday - showing recurrent pleural effusion, opacity from yesterday not well-visualized.  Per cardiology, effusion unlikely due to CHF and would avoid IV diuretics due to renal disease; will need to drain again with recurrence.  10.  PAF.  Rate controlled.  Continue amiodarone .  Eliquis  on hold due to [facility].  - 4-28: rergular tachycardia today, c/b anemia and pleural effusion.  Treat causative sources first. - 4.29: remains tachycardic,  regular rhythm. EKG showing atrial flutter.  Added metoprolol  12.5 twice daily, cardiology consulted, appreciate recommendations.  11.  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  Follow-up nephrology services.  Renal ultrasound no hydronephrosis.  Currently no plans for HD. Avoid nephrotoxins. Monitor labs. Maintain MAP > 65  - 4-28: Creatinine down to 3.6 from prior 4.5; BUN is increasing.  Hyperkalemic status post transfusion today.  Has been on continuous IVF--Dced today   - 4/29:  Per nephro, if contrast needed, avoid ACE/ARB  12.  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  Follow-up orthopedic services Dr. Abigail Abler.    13.  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  Followed by oncology services [name].  Prior to admission patient was receiving Revlimid  5 mg mouth daily for 21 days then hold for 7 days repeating every 28 days  4/28: Hemoglobin steadily downtrending to 7.1 today; given persistent tachycardia and symptoms, will transfuse 1 unit CMV negative today and follow with 40 mg IV Lasix .  4/29: HgB improved 8.9; monitor  14.  Hypothyroidism.  Synthroid  15.  Hyperlipidemia.  Zocor  16.  History of BPH.  Check  PVR--no retention per documentation. 17.  GERD.  Protonix  18.  Hypotension/hypertension.  Followed by renal services with plan to slowly wean ProAmatine . Vitals:   10/03/23 0511 10/03/23 0903  BP: (!) 134/106 (!) 166/85  Pulse: (!) 114 (!) 104  Resp: 18 (!) 24  Temp: 98.7 F (37.1 C)   SpO2: 97% 98%   Supine systolic BPs mildly elevated will order ortho vitals to see if midodrine  can be reduced  -4/28: Severe hypertension overnight.  Reduce midodrine  to 5 mg twice daily at 8 AM and noon, get orthostatic vitals today. Encourage PO fluids--remained hypertensive today, decrease midodrine  to 2.5 mg twice daily tomorrow a.m.  - 4/29: Remains elevated, DC midodrine .   19. Occasional cough, reports started couple hours ago             -Start robitussin              -Repeat Xray chest if does not improve  - 4-28: Chest x-ray today with pleural effusion, tapped as above.  Remaining right middle lobe opacity.  No fevers, WBC stable.  Monitor respiratory status following effusion tap.   - 4.29: see above; remains afebrile, WBC stable - monitor  20. Leukopenia- resolved             -Oncology s/p  GCSF, CBC daily    Latest Ref Rng & Units 10/03/2023    7:09 AM 10/02/2023    5:23 AM 10/01/2023    6:58 AM  CBC  WBC 4.0 -  10.5 K/uL 5.3  4.6  4.7   Hemoglobin 13.0 - 17.0 g/dL 8.9  7.1  7.6   Hematocrit 39.0 - 52.0 % 28.2  22.8  24.4   Platelets 150 - 400 K/uL 269  256  241     21. L knee OA             -Xray nov 2024 indicates moderate OA             -Muscle rub prn     22.  Left AC joint disruption - Nonweightbearing left upper extremity with shoulder sling for comfort.    - 4/29: Per ortho OK for WBAT; order changed  23.  Hyperkalemia.  5.8.  Likely secondary to blood transfusion today, already got 40 mg of IV Lasix  for treatment.  Repeat BMP in a.m., continue IV fluids.  - Ensure changed to Nepro  - 4/29: Cr stable, K improved 5.3. Will give Lokelma  to avoid diuretics. Repeat in AM. EKG w/o T wave changes   4 days A FACE TO FACE EVALUATION WAS PERFORMED  Bea Lime 10/03/2023, 10:25 AM

## 2023-10-03 NOTE — Consult Note (Signed)
 CARDIOLOGY CONSULT NOTE       Patient ID: [name] MRN: [id number] DOB/AGE: 1941/04/29 83 y.o.  Admit date: 09/29/2023 Referring Physician: Dorn Gaskins Primary Physician: [name], DO Primary Cardiologist: Jerelene Monday Reason for Consultation: Tachycardia  Principal Problem:   Traumatic subarachnoid hemorrhage [place]) Active Problems:   Malnutrition of moderate degree   PAF (paroxysmal atrial fibrillation) (HCC)   HPI:  83 y.o. admitted with traumatic subarachnoid hemorrhage.  Initial fall 09/21/23 found down in yard had large right effusion tapped at that time. Admitted to rehab 09/29/23. History of PAF with two prior [facility] Was on eliquis  at time of admission since d/c with SAH. Walking with PT noted high HR Reviewed ECG read as accelerated junctional but is likely atrial flutter to my review. CXR to day with small/moderate recurrent right effusion. Prior effusion was exudative. Patient denies chest pain , dyspnea did note HR a bit high. 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 . He has CRF with Cr as high as 5.6 and today 3.71.  Cath 04/2023 with no CAD and most recent echo 07/28/23 with no pericardial effusion. EF 50-55% mild/mod MR. He has been on amiodarone  200 mg daily and lopressor  12.5 mg bid.   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   right forearm, [facility]    Stroke (HCC) 11/2007   R brain subcortical infarct    Family History  Problem Relation Age of Onset   Heart disease Father    Dementia Sister    Diabetes Son    Kidney disease Neg Hx    Prostate cancer Neg Hx     Social History   Socioeconomic History   Marital status: Married    Spouse name: [name]    Number of children: 2   Years of education: 12+   Highest education level: Some college, no degree  Occupational History   Occupation: Fish farm manager: OTHER    Comment: community   Occupation: SELF EMPLOYED    Employer: SELF EMPLOYED  Tobacco Use   Smoking status: Former    Current packs/day: 0.00    Types: Cigarettes    Quit date: 06/06/2010    Years since quitting: 13.3   Smokeless tobacco: Never  Vaping Use   Vaping status: Never Used  Substance and Sexual Activity   Alcohol use: No    Alcohol/week: 0.0 standard drinks of alcohol   Drug use: No   Sexual activity: Not Currently  Other Topics Concern   Not on file  Social History Narrative   Pt lives at home with his family.   Caffeine Use- 2 cups daily   Patient has 2 children.    Patient has some college.    Patient is right handed.  Works full time   Social Drivers of Corporate investment banker Strain: Low Risk  (07/12/2021)   Overall Financial Resource Strain (CARDIA)    Difficulty of Paying Living Expenses: Not hard at all  Food Insecurity: No Food Insecurity (09/22/2023)   Hunger Vital Sign    Worried About Running Out of Food in the Last Year: Never true    Ran Out of Food in the Last Year: Never true  Transportation Needs: No Transportation Needs (09/22/2023)   PRAPARE - Administrator, Civil Service (Medical): No    Lack of Transportation (Non-Medical): No  Physical Activity: Inactive (07/12/2021)   Exercise Vital Sign    Days of Exercise per Week: 0 days    Minutes of Exercise per Session: 0 min  Stress: No Stress Concern Present (07/12/2021)   [employer] of  Occupational Health - Occupational Stress Questionnaire    Feeling of Stress : Not at all  Social Connections: Socially Integrated (09/22/2023)   Social Connection and Isolation Panel [NHANES]    Frequency of Communication with Friends and Family: More than three times a week    Frequency of Social Gatherings with Friends and Family: More than three times a week    Attends Religious Services: More than 4 times per year    Active Member of Clubs or Organizations: Yes    Attends Banker Meetings: More than 4 times per year    Marital Status: Married  Catering manager Violence: Not At Risk (09/22/2023)   Humiliation, Afraid, Rape, and Kick questionnaire    Fear of Current or Ex-Partner: No    Emotionally Abused: No    Physically Abused: No    Sexually Abused: No    Past Surgical History:  Procedure Laterality Date   ASPIRATION / INJECTION RENAL CYST  07/08/2015   BACK SURGERY     approx 20- 25 years ago   COLONOSCOPY     EYE SURGERY     cataract both eyes   GAS INSERTION  08/11/2011   Procedure: INSERTION OF GAS;  Surgeon: Rexene Catching, MD;  Location: [facility] OR;  Service: Ophthalmology;  Laterality: Right;  [id number]   IR BONE MARROW BIOPSY & ASPIRATION  08/03/2023   IR THORACENTESIS ASP PLEURAL SPACE W/IMG GUIDE  09/22/2023   IR THORACENTESIS ASP PLEURAL SPACE W/IMG GUIDE  10/02/2023   LEFT HEART CATH AND CORONARY ANGIOGRAPHY N/A 05/02/2023   Procedure: LEFT HEART CATH AND CORONARY ANGIOGRAPHY;  Surgeon: Sammy Crisp, MD;  Location: [facility] INVASIVE CV LAB;  Service: Cardiovascular;  Laterality: N/A;   REVERSE SHOULDER ARTHROPLASTY Right 04/13/2023   Procedure: REVERSE SHOULDER ARTHROPLASTY;  Surgeon: Elner Hahn, MD;  Location: [facility] ORS;  Service: Orthopedics;  Laterality: Right;   SCLERAL BUCKLE  08/11/2011   Procedure: SCLERAL BUCKLE;  Surgeon: Rexene Catching, MD;  Location[facility] OR;  Service: Ophthalmology;  Laterality: Right;   VARICOSE VEIN SURGERY        Current  Facility-Administered Medications:    0.9 %  sodium chloride  infusion (Manually program via Guardrails IV Fluids), , Intravenous, Once, Dorn Gaskins, Morgan C, DO   acetaminophen  (TYLENOL ) tablet 500 mg, 500 mg, Oral, Q6H PRN **OR** acetaminophen  (TYLENOL ) suppository 650 mg, 650 mg, Rectal, Q6H PRN, Angiulli, Everlyn Hockey, PA-C   amiodarone  (PACERONE ) tablet 200 mg, 200 mg, Oral, Daily, Angiulli, Daniel J, PA-C, 200 mg at 10/03/23 0981   dexamethasone  (DECADRON ) tablet 10 mg, 10 mg, Oral, Daily, Angiulli, Daniel J, PA-C, 10 mg  at 10/03/23 0855   DULoxetine  (CYMBALTA ) DR capsule 20 mg, 20 mg, Oral, QHS, Angiulli, Daniel J, PA-C, 20 mg at 10/02/23 2104   feeding supplement (NEPRO CARB STEADY) liquid 237 mL, 237 mL, Oral, TID WC, Love, Pamela S, PA-C, 237 mL at 10/03/23 0981   guaiFENesin -dextromethorphan  (ROBITUSSIN DM) 100-10 MG/5ML syrup 5 mL, 5 mL, Oral, Q4H PRN, Lylia Sand, MD, 5 mL at 10/01/23 1833   levothyroxine  (SYNTHROID ) tablet 100 mcg, 100 mcg, Oral, QAC breakfast, Angiulli, Daniel J, PA-C, 100 mcg at 10/03/23 1914   lidocaine  (LIDODERM ) 5 % 1 patch, 1 patch, Transdermal, Q24H, Angiulli, Daniel J, PA-C, 1 patch at 10/02/23 1848   melatonin tablet 3 mg, 3 mg, Oral, QHS PRN, Angiulli, Daniel J, PA-C, 3 mg at 10/02/23 2105   methocarbamol  (ROBAXIN ) tablet 500 mg, 500 mg, Oral, Q8H PRN, Angiulli, Daniel J, PA-C   metoprolol  tartrate (LOPRESSOR ) tablet 12.5 mg, 12.5 mg, Oral, BID, Engler, Morgan C, DO   multivitamin with minerals tablet 1 tablet, 1 tablet, Oral, Daily, Angiulli, Daniel J, PA-C, 1 tablet at 10/03/23 7829   Muscle Rub CREA, , Topical, PRN, Lylia Sand, MD   ondansetron  (ZOFRAN ) tablet 4 mg, 4 mg, Oral, Q6H PRN **OR** ondansetron  (ZOFRAN ) injection 4 mg, 4 mg, Intravenous, Q6H PRN, Angiulli, Everlyn Hockey, PA-C   pantoprazole  (PROTONIX ) EC tablet 40 mg, 40 mg, Oral, Daily, Angiulli, Daniel J, PA-C, 40 mg at 10/03/23 0855   polyethylene glycol (MIRALAX  / GLYCOLAX ) packet 17 g, 17 g,  Oral, Daily, Angiulli, Daniel J, PA-C, 17 g at 10/01/23 5621   QUEtiapine  (SEROQUEL ) tablet 25 mg, 25 mg, Oral, QHS, Angiulli, Daniel J, PA-C, 25 mg at 10/02/23 2105   senna-docusate (Senokot-S) tablet 1 tablet, 1 tablet, Oral, BID, Angiulli, Daniel J, PA-C, 1 tablet at 10/02/23 3086   simvastatin  (ZOCOR ) tablet 40 mg, 40 mg, Oral, QHS, Angiulli, Daniel J, PA-C, 40 mg at 10/02/23 2105   sodium bicarbonate  tablet 1,300 mg, 1,300 mg, Oral, TID, Angiulli, Daniel J, PA-C, 1,300 mg at 10/03/23 5784   sorbitol  70 % solution 30 mL, 30 mL, Oral, Daily PRN, Angiulli, Daniel J, PA-C   traMADol  (ULTRAM ) tablet 50 mg, 50 mg, Oral, Q12H PRN, Angiulli, Daniel J, PA-C  Facility-Administered Medications Ordered in Other Encounters:    epoetin  alfa-epbx (RETACRIT ) injection 10,000 Units, 10,000 Units, Subcutaneous, Once, Avonne Boettcher, MD   epoetin  alfa-epbx (RETACRIT ) injection 40,000 Units, 40,000 Units, Subcutaneous, Once, Avonne Boettcher, MD  sodium chloride    Intravenous Once   amiodarone   200 mg Oral Daily   dexamethasone   10 mg Oral Daily   DULoxetine   20 mg Oral QHS   feeding supplement (NEPRO CARB STEADY)  237 mL Oral TID WC   levothyroxine   100 mcg Oral QAC breakfast   lidocaine   1 patch Transdermal Q24H   metoprolol  tartrate  12.5 mg Oral BID   multivitamin with minerals  1 tablet Oral Daily   pantoprazole   40 mg Oral Daily   polyethylene glycol  17 g Oral Daily   QUEtiapine   25 mg Oral QHS   senna-docusate  1 tablet Oral BID   simvastatin   40 mg Oral QHS   sodium bicarbonate   1,300 mg Oral TID     Physical Exam: Blood pressure (!) 166/85, pulse (!) 104, temperature 98.7 F (37.1 C), resp. rate (!) 24, height 5\' 11"  (1.803 m), weight 76.4 kg, SpO2 98%.   Mild cognitive defect Bruising over left eye Lungs decreased BS right base MR apical  murmur Abdomen benign Trace edema   Labs:   Lab Results  Component Value Date   WBC 5.3 10/03/2023   HGB 8.9 (L) 10/03/2023   HCT 28.2 (L)  10/03/2023   MCV 89.2 10/03/2023   PLT 269 10/03/2023    Recent Labs  Lab 09/30/23 0235 10/02/23 2024 10/03/23 0709  NA 138   < > 139  K 5.1   < > 5.3*  CL 109   < > 110  CO2 16*   < > 19*  BUN 83*   < > 98*  CREATININE 4.57*   < > 3.71*  CALCIUM  7.4*   < > 7.6*  PROT 4.5*  --   --   BILITOT 0.6  --   --   ALKPHOS 114  --   --   ALT 46*  --   --   AST 29  --   --   GLUCOSE 134*   < > 88   < > = values in this interval not displayed.   Lab Results  Component Value Date   TROPONINI <0.03 11/09/2015    Lab Results  Component Value Date   CHOL 84 05/02/2023   CHOL 102 09/13/2022   CHOL 140 01/20/2022   Lab Results  Component Value Date   HDL 23 (L) 05/02/2023   HDL 37 (L) 09/13/2022   HDL 31 (L) 01/20/2022   Lab Results  Component Value Date   LDLCALC 47 05/02/2023   LDLCALC 49 09/13/2022   LDLCALC 85 01/20/2022   Lab Results  Component Value Date   TRIG 68 05/02/2023   TRIG 80 09/13/2022   TRIG 133 01/20/2022   Lab Results  Component Value Date   CHOLHDL 3.7 05/02/2023   CHOLHDL 2.8 12/10/2012   CHOLHDL 6.4 11/21/2007   No results found for: "LDLDIRECT"    Radiology: DG Chest 1 View Result Date: 10/02/2023 CLINICAL DATA:  Status post thoracentesis EXAM: CHEST  1 VIEW COMPARISON:  10/02/2023 FINDINGS: Normal mediastinum and cardiac silhouette. Normal pulmonary vasculature. No evidence of effusion, infiltrat no evidence of pneumothorax following RIGHT thoracentesis. RIGHT mid chest opacity again noted. There is volume loss in the RIGHT hemithorax similar to prior. IMPRESSION: No pneumothorax following thoracentesis. Electronically Signed   By: Deboraha Fallow M.D.   On: 10/02/2023 15:59   IR THORACENTESIS ASP PLEURAL SPACE W/IMG GUIDE Result Date: 10/02/2023 INDICATION: 83 year old male with recurrent right pleural effusion. Request for therapeutic thoracentesis. EXAM: ULTRASOUND GUIDED THERAPEUTIC THORACENTESIS MEDICATIONS: 6 cc of 1% lidocaine   COMPLICATIONS: None immediate. PROCEDURE: An ultrasound guided thoracentesis was thoroughly discussed with the patient and questions answered. The benefits, risks, alternatives and complications were also discussed. The patient understands and wishes to proceed with the procedure. Written consent was obtained. Ultrasound was performed to localize and mark an adequate pocket of fluid in the right chest. The area was then prepped and draped in the normal sterile fashion. 1% Lidocaine  was used for local anesthesia. Under ultrasound guidance a 6 Fr Safe-T-Centesis catheter was introduced. Thoracentesis was performed. The catheter was removed and a dressing applied. FINDINGS: A total of approximately 825 cc of clear, dark amber pleural fluid was removed. IMPRESSION: Successful ultrasound guided right thoracentesis yielding 825 cc of pleural fluid. Procedure performed by Lambert Pillion, PA-C Electronically Signed   By: Nicoletta Barrier M.D.   On: 10/02/2023 15:39   DG Chest 2 View Result Date: 10/02/2023 CLINICAL DATA:  Cough. EXAM: CHEST - 2 VIEW COMPARISON:  Chest radiograph dated  09/22/2023. FINDINGS: Right pleural effusion with partial compressive atelectasis of the right lung, progressed since the prior radiograph. And LL airspace density in the right mid lung field may represent atelectasis or infiltrate. The left lung is clear. No pneumothorax. Stable cardiomegaly. Degenerative changes of the spine. No acute osseous pathology. IMPRESSION: 1. Right pleural effusion with partial compressive atelectasis of the right lung. 2. Right mid lung field atelectasis versus infiltrate. Electronically Signed   By: Angus Bark M.D.   On: 10/02/2023 13:26   DG Abd Portable 1V Result Date: 09/27/2023 CLINICAL DATA:  Nausea, vomiting. EXAM: PORTABLE ABDOMEN - 1 VIEW COMPARISON:  July 02, 2009. FINDINGS: Moderate gastric distention is noted. No significant dilatation of large or small bowel is noted. Mild amount of stool seen  throughout the colon. IMPRESSION: Mild stool burden. Moderate gastric distention is noted of uncertain etiology. Electronically Signed   By: Rosalene Colon M.D.   On: 09/27/2023 12:14   US  RENAL Result Date: 09/26/2023 CLINICAL DATA:  [id number] AKI (acute kidney injury) (HCC) [id number] EXAM: RENAL / URINARY TRACT ULTRASOUND COMPLETE COMPARISON:  September 21, 2023 FINDINGS: Right Kidney: Renal measurements: 11.6 x 5.9 x 6.3 cm = volume: 224 mL. Multiple cysts, the largest measuring 7.3 x 6.3 x 7.1 cm. Increased cortical echogenicity. No hydronephrosis or nephrolithiasis. Left Kidney: Renal measurements: 12.4 x 5.1 x 5.4 cm = volume: 149 mL. Increased echogenicity No hydronephrosis or nephrolithiasis. Multiple cysts, the largest measures 5.5 x 5.6 x 6.4 cm. Bladder: Appears normal for degree of bladder distention. Other: Lobular hypoechogenicity in the gallbladder measuring 2.9 x 1.4 x 2.9 cm. 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 IMPRESSION: No hydronephrosis or nephrolithiasis. 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 Electronically Signed   By: Rance Burrows M.D.   On: 09/26/2023 08:20   MR CERVICAL SPINE WO CONTRAST Result Date: 09/25/2023 CLINICAL DATA:  acute neck pain, fall. EXAM: MRI CERVICAL SPINE WITHOUT CONTRAST TECHNIQUE: Multiplanar, multisequence MR imaging of the cervical spine was performed. No intravenous contrast was administered. COMPARISON:  None Available. FINDINGS: Alignment: Approximately 4 mm of anterolisthesis of C5 on C6, similar to recent CT. No other sagittal subluxation. Vertebrae: Please see recent CT of the cervical spine for characterization of bony detail. There is heterogeneous marrow without visible marrow edema. No suspicious bone lesions. Cord: Normal cord signal. Posterior Fossa, vertebral arteries, paraspinal tissues: Small volume of prevertebral edema, greatest in the upper cervical spine and extending inferiorly to the cervicothoracic  junction. Uplifting of the anterior longitudinal ligament with suspected disruption at C2 (for example see series 2, image 10). Incomplete STIR suppression in areas limits assessment. Disc levels: Severe craniocervical degenerative change. C2-C3: Facet arthropathy. No significant canal or foraminal stenosis. C3-C4: Posterior disc osteophyte complex. Bilateral facet and uncovertebral hypertrophy. Resulting severe right and moderate to severe left foraminal stenosis. Mild canal stenosis. C4-C5: Left greater than right facet and uncovertebral hypertrophy. Resulting moderate to severe left foraminal stenosis and mild right foraminal stenosis. Patent canal. C5-C6: 4 mm of anterolisthesis of C4 on C5. Posterior disc osteophyte complex with bilateral facet and uncovertebral hypertrophy. Resulting moderate canal stenosis and severe bilateral foraminal stenosis. C6-C7: Left greater than right facet and uncovertebral hypertrophy. Resulting severe left and moderate right foraminal stenosis. Patent canal. C7-T1: Left greater than right facet and uncovertebral hypertrophy. Resulting mild-to-moderate bilateral foraminal stenosis. Patent canal. IMPRESSION: 1. 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 Adjacent small volume of prevertebral edema which extends inferiorly to  the cervicothoracic junction. 2. At C5-C6, moderate canal stenosis and severe bilateral foraminal stenosis. 3. At C6-C7, severe left and moderate right foraminal stenosis. 4. At C3-C4, severe right and moderate to severe left foraminal stenosis. 5. At C4-C5, moderate to severe left foraminal stenosis. These results will be called to the ordering clinician or representative by the Radiologist Assistant, and communication documented in the PACS or [employer]. Electronically Signed   By: Stevenson Elbe M.D.   On: 09/25/2023 00:52   CT HEAD WO CONTRAST ( ) Result Date: 09/24/2023 CLINICAL DATA:  Follow-up subarachnoid hemorrhage. EXAM: CT  HEAD WITHOUT CONTRAST TECHNIQUE: Contiguous axial images were obtained from the base of the skull through the vertex without intravenous contrast. RADIATION DOSE REDUCTION: This exam was performed according to the departmental dose-optimization program which includes automated exposure control, adjustment of the mA and/or kV according to patient size and/or use of iterative reconstruction technique. COMPARISON:  CT head 09/21/2023 FINDINGS: Brain: Focal subarachnoid hemorrhage involving the sulci within the left parietal lobe are redemonstrated and appear slightly less conspicuous compared to 09/21/2023. No significant mass effect. No midline shift. Stable ventricular caliber. No new hemorrhage is noted. No evidence of acute infarct. Generalized cerebral atrophy. Vascular: No hyperdense vessel or unexpected calcification. Skull: No acute fracture. Decreased size of the left frontal scalp hematoma. Sinuses/Orbits: No acute abnormality. Other: None. IMPRESSION: Slightly decreased subarachnoid hemorrhage in the left parietal lobe compared to 09/21/2023. No new hemorrhage. Electronically Signed   By: Rozell Cornet M.D.   On: 09/24/2023 22:33   MR SHOULDER LEFT WO CONTRAST Result Date: 09/22/2023 CLINICAL DATA:  Fall. EXAM: MRI OF THE LEFT SHOULDER WITHOUT CONTRAST TECHNIQUE: Multiplanar, multisequence MR imaging of the shoulder was performed. No intravenous contrast was administered. COMPARISON:  None Available. FINDINGS: Rotator cuff: Supraspinatus and infraspinatus tendinosis with partial-thickness articular sided insertional tearing at the junctional fibers of the posterior supraspinatus and anterior infraspinatus tendons. Teres minor tendon is intact. Subscapularis tendon is intact. Muscles: Partial-thickness tear at the origin of the acromial head of the deltoid muscle with associated intramuscular edema. No intramuscular collection. No significant muscle atrophy. Biceps Long Head: Diminutive appearance with  chronic tearing of the intra-articular biceps tendon. Acromioclavicular Joint: Moderate arthropathy of the acromioclavicular joint. Widening of the acromioclavicular joint measuring 8-10 mm with associated joint effusion and disruption of the superior acromioclavicular ligament. No subacromial/subdeltoid bursal fluid. Glenohumeral Joint: Glenohumeral joint effusion. Diffuse thinning of the glenoid articular cartilage. Labrum: Diffuse labral degeneration. Tear of the superior labrum extending through the posteroinferior labrum. Bones: No acute fracture identified. Subcortical cystic change at the greater tuberosity. No aggressive osseous lesion. IMPRESSION: 1. Widening of the acromioclavicular joint measuring 8-10 mm with disruption of the superior acromioclavicular ligament and associated joint effusion. These findings are compatible with [place] type II injury. 2. Partial-thickness tear at the origin of the acromial head of the deltoid muscle with associated grade 2 strain. 3. Diffuse labral degeneration with tear of the superior labrum extending through the posteroinferior labrum. 4. Supraspinatus and infraspinatus tendinosis with partial-thickness articular sided insertional tearing at the junctional fibers of the posterior supraspinatus and anterior infraspinatus tendons. 5. Glenohumeral joint effusion. Electronically Signed   By: Mannie Seek M.D.   On: 09/22/2023 16:57   IR THORACENTESIS ASP PLEURAL SPACE W/IMG GUIDE Result Date: 09/22/2023 INDICATION: 83 year old male presented to ED after a fall, previous CT showed right pleural effusion. Request for therapeutic and diagnostic thoracentesis. EXAM: ULTRASOUND GUIDED RIGHT THORACENTESIS MEDICATIONS: 10 mL 1% lidocaine  COMPLICATIONS: None  immediate. PROCEDURE: An ultrasound guided thoracentesis was thoroughly discussed with the patient and his wife and questions answered. Wife states that patient had thoracentesis at [facility] before. The benefits, risks,  alternatives and complications were also discussed. The patient and his wife understands and wishes to proceed with the procedure. Written consent was obtained. Ultrasound was performed to localize and mark an adequate pocket of fluid in the right chest. The area was then prepped and draped in the normal sterile fashion. 1% Lidocaine  was used for local anesthesia. Under ultrasound guidance a 6 Fr Safe-T-Centesis catheter was introduced. Thoracentesis was performed. The catheter was removed and a dressing applied. FINDINGS: A total of approximately 1650 mL of hazy amber fluid was removed. Samples were sent to the laboratory as requested by the clinical team. Post procedure chest X-ray reviewed, negative for pneumothorax. IMPRESSION: Successful ultrasound guided right thoracentesis yielding 1650 mL of pleural fluid. Performed by: Aimee Han, PA-C Electronically Signed   By: Nicoletta Barrier M.D.   On: 09/22/2023 16:05   DG Chest 1 View Result Date: 09/22/2023 CLINICAL DATA:  [id number] Status post thoracentesis [id number]. EXAM: CHEST  1 VIEW COMPARISON:  09/21/2023. FINDINGS: There is small right pleural effusion with associated compressive atelectatic changes in the right lung. There is significant interval decrease in the amount of effusion, since the prior study, status post thoracentesis. No pneumothorax. Bilateral lung fields are otherwise clear. Left lateral costophrenic angle is clear. Stable cardio-mediastinal silhouette. No acute osseous abnormalities. Right reverse shoulder arthroplasty noted. The soft tissues are within normal limits. IMPRESSION: *Significant interval decrease in the amount of right pleural effusion, status post thoracentesis. No pneumothorax. Electronically Signed   By: Beula Brunswick M.D.   On: 09/22/2023 15:44   CT CHEST ABDOMEN PELVIS W CONTRAST Result Date: 09/21/2023 CLINICAL DATA:  Trauma EXAM: CT CHEST, ABDOMEN, AND PELVIS WITH CONTRAST TECHNIQUE: Multidetector CT imaging of the chest,  abdomen and pelvis was performed following the standard protocol during bolus administration of intravenous contrast. RADIATION DOSE REDUCTION: This exam was performed according to the departmental dose-optimization program which includes automated exposure control, adjustment of the mA and/or kV according to patient size and/or use of iterative reconstruction technique. CONTRAST:  75mL OMNIPAQUE  IOHEXOL  350 MG/ML SOLN COMPARISON:  CT abdomen and pelvis 05/02/2023. MRI abdomen 02/02/2023. FINDINGS: CT CHEST FINDINGS Cardiovascular: Ascending aortic aneurysm measures 4 cm. The heart is moderately enlarged. There is a trace pericardial effusion. There are atherosclerotic calcifications of the aorta. Mediastinum/Nodes: There is a peripherally calcified right thyroid  nodule measuring 1 cm. There are no enlarged mediastinal or hilar lymph nodes. The visualized esophagus is within normal limits. Lungs/Pleura: There is a large right pleural effusion which has increased from prior. There is compressive atelectasis of the right middle lobe and right lower lobe. There is minimal dependent atelectasis in the left lower lobe. The left lung is otherwise clear. Trachea and central airways are patent. Musculoskeletal: Right shoulder arthroplasty is present. No acute fractures are seen. CT ABDOMEN PELVIS FINDINGS Hepatobiliary: Heterogeneous enhancing lesion in the liver measures 2.9 x 2.4 cm and is not well seen on delayed imaging favored as hemangioma, confirmed by prior MRI. No subcapsular fluid collection. Gallbladder and bile ducts are within normal limits. Pancreas: Unremarkable. No pancreatic ductal dilatation or surrounding inflammatory changes. Spleen: The spleen is mild-to-moderately enlarged, unchanged. Adrenals/Urinary Tract: Bilateral renal cysts are unchanged. The largest simple cyst in the right kidney measures 7.4 cm. The largest simple cyst in the left kidney measures 6.3 cm. Within the  inferior pole the right  kidney there is an exophytic cystic area with peripheral calcifications measuring 7.9 x 12.5 cm, similar to prior. There are numerable additional hypodensities which are too small to characterize in both kidneys, favored as cysts. There is no hydronephrosis or perinephric fluid collection. The adrenal glands and bladder are within normal limits. Stomach/Bowel: Stomach is within normal limits. Appendix appears normal. No evidence of bowel wall thickening, distention, or inflammatory changes. There is diffuse colonic diverticulosis. 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 Aorta and IVC are normal in size there are atherosclerotic calcifications of the aorta and coronary arteries. No enlarged lymph nodes are identified. Reproductive: Prostate is unremarkable. Other: No abdominal wall hernia or abnormality. No abdominopelvic ascites. Musculoskeletal: Multilevel degenerative changes affect the spine. No acute fractures are identified. IMPRESSION: 1. No acute posttraumatic sequelae in the chest, abdomen or pelvis. 2. Large right pleural effusion has increased from prior with compressive atelectasis of the right middle lobe and right lower lobe. 3. Stable cardiomegaly. 4. 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 Recommend annual imaging followup by CTA or MRA. This recommendation follows 2010 ACCF/AHA/AATS/ACR/ASA/SCA/SCAI/SIR/STS/SVM Guidelines for the Diagnosis and Management of Patients with Thoracic Aortic Disease. Circulation. 2010; 121: Y782-N562. Aortic aneurysm NOS (ICD10-I71.9) 5. Stable splenomegaly and splenic varices. 6. 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 7. Stable bilateral renal cysts. Stable 1 cm peripherally calcified cyst in the inferior pole of the right kidney. 8. Colonic diverticulosis. Aortic Atherosclerosis (ICD10-I70.0). Electronically Signed   By: Tyron Gallon M.D.   On: 09/21/2023 17:26   DG Pelvis Portable Result Date: 09/21/2023 CLINICAL DATA:  Status post fall on  blood thinners with altered mental status EXAM: PORTABLE PELVIS 2 VIEWS COMPARISON:  CT abdomen and pelvis dated 05/02/2023 FINDINGS: There is no evidence of acute displaced pelvic fracture or diastasis. No pelvic bone lesions are seen. IMPRESSION: No acute displaced pelvic fracture or diastasis. Electronically Signed   By: Limin  Xu M.D.   On: 09/21/2023 16:38   DG Chest Port 1 View Result Date: 09/21/2023 CLINICAL DATA:  Trauma EXAM: PORTABLE CHEST 1 VIEW COMPARISON:  Chest x-ray 05/04/2023 FINDINGS: There is a moderate-sized right pleural effusion. The heart is enlarged there central pulmonary vascular congestion. The cardiac silhouette is enlarged, unchanged. Right shoulder arthroplasty is present. No acute fractures are seen. IMPRESSION: 1. Moderate-sized right pleural effusion. 2. Cardiomegaly with central pulmonary vascular congestion. Electronically Signed   By: Tyron Gallon M.D.   On: 09/21/2023 16:38   CT HEAD WO CONTRAST Addendum Date: 09/21/2023 ADDENDUM REPORT: 09/21/2023 16:05 ADDENDUM: These results were called by telephone at the time of interpretation on 09/21/2023 at 4:02 pm to provider TRAVIS YOUNG , who verbally acknowledged these results. Electronically Signed   By: Denny Flack M.D.   On: 09/21/2023 16:05   Result Date: 09/21/2023 CLINICAL DATA:  Blunt trauma, fall. EXAM: CT HEAD WITHOUT CONTRAST TECHNIQUE: Contiguous axial images were obtained from the base of the skull through the vertex without intravenous contrast. RADIATION DOSE REDUCTION: This exam was performed according to the departmental dose-optimization program which includes automated exposure control, adjustment of the mA and/or kV according to patient size and/or use of iterative reconstruction technique. COMPARISON:  CT head 04/17/2023 FINDINGS: Brain: Focal subarachnoid hemorrhage involving sulci within the left parietal lobe. No additional areas of intracranial hemorrhage noted. No edema or mass effect. No midline  shift. No CT evidence of acute infarct. Nonspecific hypoattenuation in the periventricular and subcortical white matter favored to reflect chronic microvascular  ischemic changes. The basilar cisterns are patent. Ventricles: Prominence of the ventricles suggesting underlying parenchymal volume loss. Vascular: Atherosclerotic calcifications of the carotid siphons and intracranial vertebral arteries. No hyperdense vessel. Skull: No acute or aggressive finding. Orbits: Bilateral lens replacement. Scleral buckle on the right. Orbits otherwise unremarkable. Sinuses: Mild mucosal thickening in the right maxillary sinus. Other: Mastoid air cells are clear. Left frontal scalp hematoma. Additional soft tissue swelling in the left parietal scalp. Traumatic Brain Injury Risk Stratification Skull Fracture: No - Low/mBIG 1 Subdural Hematoma (SDH): No - Low Subarachnoid Hemorrhage [facility]): trace (up to 2 sulci) - Low/mBIG 1 Epidural Hematoma (EDH): No - Low/mBIG 1 Cerebral contusion, intra-axial, intraparenchymal Hemorrhage (IPH): No Intraventricular Hemorrhage (IVH): No - Low/mBIG 1 Midline Shift > 1mm or Edema/effacement of sulci/vents: No - Low/mBIG 1 ---------------------------------------------------- IMPRESSION: Focal subarachnoid hemorrhage involving a single sulcus of the left parietal lobe. No edema, mass effect, or midline shift. Left frontoparietal scalp hematomas. Chronic microvascular ischemic changes and mild to moderate parenchymal volume loss. Electronically Signed: By: Denny Flack M.D. On: 09/21/2023 15:58   CT CERVICAL SPINE WO CONTRAST Result Date: 09/21/2023 CLINICAL DATA:  Poly trauma, blunt.  Status post fall. EXAM: CT CERVICAL SPINE WITHOUT CONTRAST TECHNIQUE: Multidetector CT imaging of the cervical spine was performed without intravenous contrast. Multiplanar CT image reconstructions were also generated. RADIATION DOSE REDUCTION: This exam was performed according to the departmental dose-optimization  program which includes automated exposure control, adjustment of the mA and/or kV according to patient size and/or use of iterative reconstruction technique. COMPARISON:  Cervical spine radiographs 09/18/2012. MRI cervical spine 01/14/2011. Head CT and neck CTA 04/12/2022. FINDINGS: Alignment: Straightening of the usual cervical lordosis with a mild convex right scoliosis. 4 mm of anterolisthesis at C5-6, increased from previous cervical MRI. Skull base and vertebrae: There is diffuse spondylosis with severe disc space narrowing and facet arthrosis throughout the cervical spine. There is interfacetal ankylosis bilaterally at C2-3 and on the left at C4-5. Possible interbody ankylosis at those levels as well. Asymmetric arthrosis involving the right lateral atlantoaxial joint. Suggested nondisplaced fracture of the posterior arch of C1 on the right (best seen on axial images 24 and 25 of series 4) is grossly unchanged from previous head CT (image 1/5). No other evidence of acute cervical spine fracture or traumatic subluxation. Soft tissues and spinal canal: No prevertebral fluid or swelling. No visible canal hematoma. Disc levels: As alluded to above, there is severe multilevel spondylosis with disc space narrowing, uncinate spurring and facet arthrosis. Findings have progressed from previous MRI and contribute to spinal stenosis and foraminal narrowing at multiple levels. Upper chest: Chest findings dictated separately. Other: Bilateral carotid atherosclerosis. IMPRESSION: 1. No evidence of acute cervical spine fracture or traumatic subluxation. 2. 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 3. No acute soft tissue abnormalities identified. Electronically Signed   By: Elmon Hagedorn M.D.   On: 09/21/2023 15:25    EKG: atrial flutter new since 09/21/23 rate 118   ASSESSMENT AND PLAN:   PAF: has had two prior [facility]. Not on anticoagulation due to recent [facility]. Increase lopressor   for rate control and increase amiodarone  to 200 mg bid. At some point he would be a good Watchman candidate Pleural effusion: consider repeat IR thoracentesis prior one exudative EF 50-55% doubt CHF being unilateral and recurrent Given renal failure mechanical drainage favored over iv diuretic CRF:  concerning given age and co morbidities not clear that he would be a dialysis candidate  Apparently was to have renal biopsy before all this trauma MDS:  cell counts ok for now PLT 269  transfused for Hct 22.8 and now 28.2   Signed: Janelle Mediate 10/03/2023, 12:35 PM

## 2023-10-03 NOTE — Progress Notes (Signed)
 Physical Therapy Session Note  Patient Details  Name: [name] MRN: [id number] Date of Birth: 28-Dec-1940  Today's Date: 10/03/2023 PT Individual Time: 0904-1000 PT Individual Time Calculation (min): 56 min   Today's Date: 10/03/2023 PT Individual Time: 3474-2595 PT Individual Time Calculation (min): 43 min   Short Term Goals: Week 1:  PT Short Term Goal 1 (Week 1): pt will perform all transfers with LRAD and CGA PT Short Term Goal 2 (Week 1): pt will ambulate 164ft with LRAD and CGA PT Short Term Goal 3 (Week 1): pt will navigate 4 6in steps with 1 handrail and CGA  Skilled Therapeutic Interventions/Progress Updates:     1st Session: Pt received supine in bed and agrees to therapy. No complaint of pain but reports having difficulty night with having just started lasix  and having to urinate frequently. PT provides frequent rest breaks due to fatigue. Pt performs supine to sit with bed features and increased time required, with PT providing cues for body mechanics and positioning at EOB to prepare for transfer. Sit to stand with cues for hand placement and body mechanics, as well as anterior weight shift. Pt ambulates to WC with minA and cues for safe positioning prior to transition to WC. WC transport to gym. Pt stands with minA and ambulates x100' with Large based quad cane and CGA, with cues to monitor level of exertion to increase safety awareness. Once seated, vitals assessed with O2 at 97% and HR 124 bpm. Following extended seated rest break, pt ambulates same distance and again HR reads ~124 bpm immediately following bout. Following rest break, pt completes x5 reps of sit to stand with CGA increasing to minA with fatigue to promote anterior weight shifting and powering up. Pt completes for strengthening and functional transfer training. Following, pt ambulates additional x100' with RW and same assistance and cues.   Pt left semi reclined in bed with alarm intact and all needs within  reach.   2nd Session: Pt received semi reclined in bed and agrees to therapy. No complaint of pain. PT provides education to pt regarding updated precautions, in that pt is now WBAT through LUE. Pt performs supine to sit with bed features and increased time required. Pt performs stand step transfer from bed to [facility] with RW and cues for positioning and safe AD management. WC transport to gym. Pt completes transfer to Nustep with CGA and RW. Pt completes Nustep for endurance training and strengthening, completing x10:00 at workload of 5 with average steps per minute ~33. PT provides cues for hand and foot placement and completing full available ROM. Following rest break, pt ambulates x175' with RW and cues for upright gaze to improve posture and balance, and increasing stride length to decrease risk for falls.   Pt completes TUG test with RW and cues for correct completion, as well as PT demonstration. Pt records following times: 26.6, 30.8, and  25.2 seconds.   WC transport back to room. Stand step to bed with RW and cues for positioning. Left supine in bed with alarm intact and all needs within reach.    Therapy Documentation Precautions:  Precautions Precautions: Fall Required Braces or Orthoses: Sling Restrictions Weight Bearing Restrictions Per Provider Order: Yes LUE Weight Bearing Per Provider Order: Non weight bearing Other Position/Activity Restrictions: Lt shoulder NWB, gentle ROM, sling for comfort. C2 ALL injury   Therapy/Group: Individual Therapy  Neva Barban, PT, DPT 10/03/2023, 4:23 PM

## 2023-10-03 NOTE — Progress Notes (Signed)
 Patient ID: [name], male   DOB: Apr 10, 1941, 83 y.o.   MRN: [id number]  SW met with pt in room to provide updates fro team conference, and d/c date 5/6. He was not happy about date, however, understands more gains need to be made in rehab to help increase his independence.He reluctantly agrees.   1330- SW spoke with pt wife to discuss above. Fam edu scheduled for Friday 1pm-4pm.   Norval Been, MSW, LCSW Office: [phone] Cell: [phone] Fax: [phone]

## 2023-10-03 NOTE — Plan of Care (Signed)
  Problem: RH BOWEL ELIMINATION Goal: RH STG MANAGE BOWEL WITH ASSISTANCE Description: STG Manage Bowel with  supervision Assistance. Outcome: Progressing   Problem: RH BLADDER ELIMINATION Goal: RH STG MANAGE BLADDER WITH ASSISTANCE Description: STG Manage Bladder With supervision Assistance Outcome: Progressing   Problem: RH SKIN INTEGRITY Goal: RH STG SKIN FREE OF INFECTION/BREAKDOWN Description: Manage skin free of infection/breakdown with supervision assistance Outcome: Progressing   Problem: RH SAFETY Goal: RH STG ADHERE TO SAFETY PRECAUTIONS W/ASSISTANCE/DEVICE Description: STG Adhere to Safety Precautions With  supervision Assistance/Device. Outcome: Progressing   Problem: RH PAIN MANAGEMENT Goal: RH STG PAIN MANAGED AT OR BELOW PT'S PAIN GOAL Description: <4 w/ prns Outcome: Progressing

## 2023-10-03 NOTE — Progress Notes (Signed)
 Speech Language Pathology Daily Session Note  Patient Details  Name: RANDIE EWIN MRN: [id number] Date of Birth: Feb 09, 1941  Today's Date: 10/03/2023 SLP Individual Time: 1330-1400 SLP Individual Time Calculation (min): 30 min  Short Term Goals: Week 1: SLP Short Term Goal 1 (Week 1): Patient will recall and utilize memory compensatory aids given min multimodal A SLP Short Term Goal 2 (Week 1): Patient will demonstrate functional problem solving skills in mildly complex situations given min multimodal A SLP Short Term Goal 3 (Week 1): Patient will recall cognitive and physical changes since admission given min multimodal A  Skilled Therapeutic Interventions:   Pt greeted at bedside. SLP facilitated tx tasks targeting cognition. During initial conversation re problem solving/discharge, he benefited from minA cues for mental flexibility and reasoning. He was also provided w/ 3 words to recall. He immediately recalled 3/3 words. After ~15 min delay, he recalled 1/3 words spontaneously and benefited from minA cues to recall 3/3. Between recall times, he  completed a verbal time calculation task w/ minA for information processing and working memory. He demonstrated improved mental flexibility and problem solving during verbal task providing multiple definitions, as he required only s cues. At the end of tasks, he was left in bed with the alarm set. Call light within reach. Recommend cont ST.   Pain  No pain reported  Therapy/Group: Individual Therapy  Rozell Cornet 10/03/2023, 8:21 AM

## 2023-10-03 NOTE — Progress Notes (Signed)
 Occupational Therapy Session Note  Patient Details  Name: [name] MRN: [id number] Date of Birth: 1940-09-12  Today's Date: 10/03/2023 OT Individual Time: 5621-3086 OT Individual Time Calculation (min): 73 min    Short Term Goals: Week 1:  OT Short Term Goal 1 (Week 1): STG= LTG d/t ELOS  Skilled Therapeutic Interventions/Progress Updates:    Pt received supine with no c/o pain, agreeable to OT session. He came to EOB with min cueing for adherence to NWB LUE, but (S) overall. Ambulatory transfer into the bathroom with CGA uisng large base quad cane. CGA for toileting tasks. (+) very loose BM void. He transferred into the shower following with min cueing and CGA. He completed UB bathing seated with (S). Min cueing for thoroughness of LB, in standing with CGA.  He transferred back to w/c following with CGA. He donned a shirt with (S), pants with good use of figure 4 technique, CGA. He was able to put in hearing aids today. He completed 100 ft of functional mobility with the quad cane with CGA. He completed functional stepping activity, working on sequencing navigating a 4 in step with the quad cane, requiring mod cueing initially and then only min, as well as min A. Pt returned to their room following. He was left supine with all needs met, bed alarm set.   BP on toilet: 176/93, HR 123 bpm BP following shower: 161/93, HR 113 bpm  Therapy Documentation Precautions:  Precautions Precautions: Fall Required Braces or Orthoses: Sling Restrictions Weight Bearing Restrictions Per Provider Order: Yes LUE Weight Bearing Per Provider Order: Non weight bearing Other Position/Activity Restrictions: Lt shoulder NWB, gentle ROM, sling for comfort. C2 ALL injury Therapy/Group: Individual Therapy  Una Ganser 10/03/2023, 6:24 AM

## 2023-10-04 DIAGNOSIS — S066X0A Traumatic subarachnoid hemorrhage without loss of consciousness, initial encounter: Secondary | ICD-10-CM

## 2023-10-04 LAB — BASIC METABOLIC PANEL WITH GFR
Anion gap: 9 (ref 5–15)
BUN: 98 mg/dL — ABNORMAL HIGH (ref 8–23)
CO2: 19 mmol/L — ABNORMAL LOW (ref 22–32)
Calcium: 7.3 mg/dL — ABNORMAL LOW (ref 8.9–10.3)
Chloride: 110 mmol/L (ref 98–111)
Creatinine, Ser: 3.51 mg/dL — ABNORMAL HIGH (ref 0.61–1.24)
GFR, Estimated: 17 mL/min — ABNORMAL LOW (ref >60)
Glucose, Bld: 105 mg/dL — ABNORMAL HIGH (ref 70–99)
Potassium: 5.3 mmol/L — ABNORMAL HIGH (ref 3.5–5.1)
Sodium: 138 mmol/L (ref 135–145)

## 2023-10-04 LAB — CBC WITH DIFFERENTIAL/PLATELET
Abs Immature Granulocytes: 0 10*3/uL (ref 0.00–0.07)
Band Neutrophils: 1 %
Basophils Absolute: 0 10*3/uL (ref 0.0–0.1)
Basophils Relative: 1 %
Eosinophils Absolute: 0 10*3/uL (ref 0.0–0.5)
Eosinophils Relative: 0 %
HCT: 25.1 % — ABNORMAL LOW (ref 39.0–52.0)
Hemoglobin: 7.8 g/dL — ABNORMAL LOW (ref 13.0–17.0)
Lymphocytes Relative: 20 %
Lymphs Abs: 0.7 10*3/uL (ref 0.7–4.0)
MCH: 28 pg (ref 26.0–34.0)
MCHC: 31.1 g/dL (ref 30.0–36.0)
MCV: 90 fL (ref 80.0–100.0)
Monocytes Absolute: 0.3 10*3/uL (ref 0.1–1.0)
Monocytes Relative: 8 %
Neutro Abs: 2.6 10*3/uL (ref 1.7–7.7)
Neutrophils Relative %: 70 %
Platelets: 217 10*3/uL (ref 150–400)
RBC: 2.79 MIL/uL — ABNORMAL LOW (ref 4.22–5.81)
RDW: 18.1 % — ABNORMAL HIGH (ref 11.5–15.5)
WBC: 3.6 10*3/uL — ABNORMAL LOW (ref 4.0–10.5)
nRBC: 0 % (ref 0.0–0.2)
nRBC: 2 /100{WBCs} — ABNORMAL HIGH

## 2023-10-04 LAB — GLUCOSE, CAPILLARY
Glucose-Capillary: 91 mg/dL (ref 70–99)
Glucose-Capillary: 130 mg/dL — ABNORMAL HIGH (ref 70–99)

## 2023-10-04 LAB — HEMOGLOBIN AND HEMATOCRIT, BLOOD
HCT: 27.9 % — ABNORMAL LOW (ref 39.0–52.0)
Hemoglobin: 8.5 g/dL — ABNORMAL LOW (ref 13.0–17.0)

## 2023-10-04 LAB — OCCULT BLOOD X 1 CARD TO LAB, STOOL: Fecal Occult Bld: NEGATIVE

## 2023-10-04 MED ORDER — SENNOSIDES-DOCUSATE SODIUM 8.6-50 MG PO TABS
2.0000 | ORAL_TABLET | Freq: Two times a day (BID) | ORAL | Status: DC
  Administered 2023-10-04 – 2023-10-10 (×10): 2 via ORAL
  Filled 2023-10-04 (×13): qty 2

## 2023-10-04 MED ORDER — QUETIAPINE FUMARATE 25 MG PO TABS
25.0000 mg | ORAL_TABLET | Freq: Every evening | ORAL | Status: DC | PRN
Start: 2023-10-04 — End: 2023-10-10
  Administered 2023-10-04 – 2023-10-09 (×4): 25 mg via ORAL
  Filled 2023-10-04 (×4): qty 1

## 2023-10-04 MED ORDER — POLYETHYLENE GLYCOL 3350 17 G PO PACK
17.0000 g | PACK | Freq: Two times a day (BID) | ORAL | Status: DC
  Administered 2023-10-04 – 2023-10-06 (×4): 17 g via ORAL
  Filled 2023-10-04 (×5): qty 1

## 2023-10-04 MED ORDER — METOPROLOL TARTRATE 25 MG PO TABS
25.0000 mg | ORAL_TABLET | Freq: Two times a day (BID) | ORAL | Status: DC
  Administered 2023-10-04 – 2023-10-07 (×7): 25 mg via ORAL
  Filled 2023-10-04 (×6): qty 1

## 2023-10-04 MED ORDER — SODIUM ZIRCONIUM CYCLOSILICATE 5 G PO PACK
5.0000 g | PACK | Freq: Once | ORAL | Status: AC
  Administered 2023-10-04: 5 g via ORAL
  Filled 2023-10-04: qty 1

## 2023-10-04 NOTE — Progress Notes (Signed)
 Subjective:  Denies SSCP, palpitations or Dyspnea Cognitive defects   Objective:  Vitals:   10/03/23 1954 10/04/23 0000 10/04/23 0500 10/04/23 0508  BP: 121/86 118/82  129/81  Pulse: (!) 109 (!) 102    Resp: 16 16  16   Temp: 98.6 F (37 C) 98.8 F (37.1 C)  98.2 F (36.8 C)  TempSrc: Oral Oral  Oral  SpO2: 98% 98%  99%  Weight:   75.3 kg   Height:        Intake/Output from previous day:  Intake/Output Summary (Last 24 hours) at 10/04/2023 0858 Last data filed at 10/04/2023 0800 Gross per 24 hour  Intake 560 ml  Output 1750 ml  Net -1190 ml    Physical Exam: Mild cognitive defect Bruising over left eye Lungs decreased BS right base MR apical murmur Abdomen benign Trace edema   Lab Results: Basic Metabolic Panel: Recent Labs    10/03/23 0709 10/04/23 0513  NA 139 138  K 5.3* 5.3*  CL 110 110  CO2 19* 19*  GLUCOSE 88 105*  BUN 98* 98*  CREATININE 3.71* 3.51*  CALCIUM  7.6* 7.3*    CBC: Recent Labs    10/03/23 0709 10/04/23 0513  WBC 5.3 3.6*  NEUTROABS 2.7 2.6  HGB 8.9* 7.8*  HCT 28.2* 25.1*  MCV 89.2 90.0  PLT 269 217    D-Dimer: Recent Labs    10/03/23 1320  DDIMER 1.75*    Imaging: DG CHEST PORT 1 VIEW Result Date: 10/03/2023 CLINICAL DATA:  142230 Pleural effusion 142230 EXAM: PORTABLE CHEST - 1 VIEW COMPARISON:  October 02, 2023 FINDINGS: Small right pleural effusion with right basilar atelectasis. Moderate cardiomegaly. Tortuous aorta with aortic atherosclerosis. No acute fracture or destructive lesions. Multilevel thoracic osteophytosis. Right glenohumeral joint arthroplasty. IMPRESSION: 1. Worsening, small right pleural effusion with right basilar atelectasis. 2. The nodular opacity in the right midlung on the prior radiograph is not well visualized. Electronically Signed   By: Rance Burrows M.D.   On: 10/03/2023 14:39   DG Chest 1 View Result Date: 10/02/2023 CLINICAL DATA:  Status post thoracentesis EXAM: CHEST  1 VIEW  COMPARISON:  10/02/2023 FINDINGS: Normal mediastinum and cardiac silhouette. Normal pulmonary vasculature. No evidence of effusion, infiltrat no evidence of pneumothorax following RIGHT thoracentesis. RIGHT mid chest opacity again noted. There is volume loss in the RIGHT hemithorax similar to prior. IMPRESSION: No pneumothorax following thoracentesis. Electronically Signed   By: Deboraha Fallow M.D.   On: 10/02/2023 15:59   IR THORACENTESIS ASP PLEURAL SPACE W/IMG GUIDE Result Date: 10/02/2023 INDICATION: 83 year old male with recurrent right pleural effusion. Request for therapeutic thoracentesis. EXAM: ULTRASOUND GUIDED THERAPEUTIC THORACENTESIS MEDICATIONS: 6 cc of 1% lidocaine  COMPLICATIONS: None immediate. PROCEDURE: An ultrasound guided thoracentesis was thoroughly discussed with the patient and questions answered. The benefits, risks, alternatives and complications were also discussed. The patient understands and wishes to proceed with the procedure. Written consent was obtained. Ultrasound was performed to localize and mark an adequate pocket of fluid in the right chest. The area was then prepped and draped in the normal sterile fashion. 1% Lidocaine  was used for local anesthesia. Under ultrasound guidance a 6 Fr Safe-T-Centesis catheter was introduced. Thoracentesis was performed. The catheter was removed and a dressing applied. FINDINGS: A total of approximately 825 cc of clear, dark amber pleural fluid was removed. IMPRESSION: Successful ultrasound guided right thoracentesis yielding 825 cc of pleural fluid. Procedure performed by Lambert Pillion, PA-C Electronically Signed   By: Nicoletta Barrier  M.D.   On: 10/02/2023 15:39   DG Chest 2 View Result Date: 10/02/2023 CLINICAL DATA:  Cough. EXAM: CHEST - 2 VIEW COMPARISON:  Chest radiograph dated 09/22/2023. FINDINGS: Right pleural effusion with partial compressive atelectasis of the right lung, progressed since the prior radiograph. And LL airspace density  in the right mid lung field may represent atelectasis or infiltrate. The left lung is clear. No pneumothorax. Stable cardiomegaly. Degenerative changes of the spine. No acute osseous pathology. IMPRESSION: 1. Right pleural effusion with partial compressive atelectasis of the right lung. 2. Right mid lung field atelectasis versus infiltrate. Electronically Signed   By: Angus Bark M.D.   On: 10/02/2023 13:26    Cardiac Studies:  ECG: Likely flutter 118 10/03/23 pending this am    Telemetry: not on   Echo: 07/28/23 stable EF 50-55% mild/mod MR   Medications:    sodium chloride    Intravenous Once   amiodarone   200 mg Oral Daily   dexamethasone   10 mg Oral Daily   DULoxetine   20 mg Oral QHS   feeding supplement (NEPRO CARB STEADY)  237 mL Oral TID WC   levothyroxine   100 mcg Oral QAC breakfast   lidocaine   1 patch Transdermal Q24H   metoprolol  tartrate  12.5 mg Oral BID   multivitamin with minerals  1 tablet Oral Daily   pantoprazole   40 mg Oral Daily   polyethylene glycol  17 g Oral BID   senna-docusate  2 tablet Oral BID   simvastatin   40 mg Oral QHS   sodium bicarbonate   1,300 mg Oral TID   sodium zirconium cyclosilicate   5 g Oral Once      Assessment/Plan:   PAF: has had two prior [facility]. Not on anticoagulation due to recent [facility]. Increase lopressor  for rate control and increase amiodarone  to 200 mg bid. At some point he would be a good Watchman candidate Pleural effusion: consider repeat IR thoracentesis prior one exudative EF 50-55% doubt CHF being unilateral and recurrent Given renal failure mechanical drainage favored over iv diuretic D dimer elevated doubt PE ? V/Q per primary service  CRF:  concerning given age and co morbidities not clear that he would be a dialysis candidate Apparently was to have renal biopsy before all this trauma Cr 3.51 this am K 5.3  MDS:  cell counts with WBC/Hct dropping this am PLT;s ok     Janelle Mediate 10/04/2023, 8:58 AM

## 2023-10-04 NOTE — Progress Notes (Signed)
 PROGRESS NOTE   Subjective/Complaints:  Ongoing tachycardia, mild, improved.  Patient denies any symptoms, states he has a little bit more energy today.  No chest pain, palpitations, shortness of breath.  NM scan not done overnight--Other vitals stable, no further tachypnea or respiratory distress.  K remains elevated after lokelma  yesterday.--has not had BM in a few days. Increase bowel regiumen and dose 1x.    Cr stable. WBC down to 3/6. HgB downtrending 7.8  ROS: Positives per HPI above. Denies fevers, chills, N/V, abdominal pain, chest pain, new weakness or paraesthesias.   Positive for tachycardia  Objective:   DG CHEST PORT 1 VIEW Result Date: 10/03/2023 CLINICAL DATA:  142230 Pleural effusion 142230 EXAM: PORTABLE CHEST - 1 VIEW COMPARISON:  October 02, 2023 FINDINGS: Small right pleural effusion with right basilar atelectasis. Moderate cardiomegaly. Tortuous aorta with aortic atherosclerosis. No acute fracture or destructive lesions. Multilevel thoracic osteophytosis. Right glenohumeral joint arthroplasty. IMPRESSION: 1. Worsening, small right pleural effusion with right basilar atelectasis. 2. The nodular opacity in the right midlung on the prior radiograph is not well visualized. Electronically Signed   By: Rance Burrows M.D.   On: 10/03/2023 14:39   DG Chest 1 View Result Date: 10/02/2023 CLINICAL DATA:  Status post thoracentesis EXAM: CHEST  1 VIEW COMPARISON:  10/02/2023 FINDINGS: Normal mediastinum and cardiac silhouette. Normal pulmonary vasculature. No evidence of effusion, infiltrat no evidence of pneumothorax following RIGHT thoracentesis. RIGHT mid chest opacity again noted. There is volume loss in the RIGHT hemithorax similar to prior. IMPRESSION: No pneumothorax following thoracentesis. Electronically Signed   By: Deboraha Fallow M.D.   On: 10/02/2023 15:59   IR THORACENTESIS ASP PLEURAL SPACE W/IMG GUIDE Result  Date: 10/02/2023 INDICATION: 83 year old male with recurrent right pleural effusion. Request for therapeutic thoracentesis. EXAM: ULTRASOUND GUIDED THERAPEUTIC THORACENTESIS MEDICATIONS: 6 cc of 1% lidocaine  COMPLICATIONS: None immediate. PROCEDURE: An ultrasound guided thoracentesis was thoroughly discussed with the patient and questions answered. The benefits, risks, alternatives and complications were also discussed. The patient understands and wishes to proceed with the procedure. Written consent was obtained. Ultrasound was performed to localize and mark an adequate pocket of fluid in the right chest. The area was then prepped and draped in the normal sterile fashion. 1% Lidocaine  was used for local anesthesia. Under ultrasound guidance a 6 Fr Safe-T-Centesis catheter was introduced. Thoracentesis was performed. The catheter was removed and a dressing applied. FINDINGS: A total of approximately 825 cc of clear, dark amber pleural fluid was removed. IMPRESSION: Successful ultrasound guided right thoracentesis yielding 825 cc of pleural fluid. Procedure performed by Lambert Pillion, PA-C Electronically Signed   By: Nicoletta Barrier M.D.   On: 10/02/2023 15:39   DG Chest 2 View Result Date: 10/02/2023 CLINICAL DATA:  Cough. EXAM: CHEST - 2 VIEW COMPARISON:  Chest radiograph dated 09/22/2023. FINDINGS: Right pleural effusion with partial compressive atelectasis of the right lung, progressed since the prior radiograph. And LL airspace density in the right mid lung field may represent atelectasis or infiltrate. The left lung is clear. No pneumothorax. Stable cardiomegaly. Degenerative changes of the spine. No acute osseous pathology. IMPRESSION: 1. Right pleural effusion with partial compressive atelectasis of the  right lung. 2. Right mid lung field atelectasis versus infiltrate. Electronically Signed   By: Angus Bark M.D.   On: 10/02/2023 13:26   Recent Labs    10/03/23 0709 10/04/23 0513  WBC 5.3 3.6*  HGB  8.9* 7.8*  HCT 28.2* 25.1*  PLT 269 217   Recent Labs    10/03/23 0709 10/04/23 0513  NA 139 138  K 5.3* 5.3*  CL 110 110  CO2 19* 19*  GLUCOSE 88 105*  BUN 98* 98*  CREATININE 3.71* 3.51*  CALCIUM  7.6* 7.3*    Intake/Output Summary (Last 24 hours) at 10/04/2023 0726 Last data filed at 10/04/2023 0600 Gross per 24 hour  Intake 320 ml  Output 1550 ml  Net -1230 ml         Physical Exam: Vital Signs Blood pressure 129/81, pulse (!) 102, temperature 98.2 F (36.8 C), resp. rate 16, height 5\' 11"  (1.803 m), weight 75.3 kg, SpO2 99%.  General: No acute distress.  Laying in bed.  Mood and affect are appropriate Heart: Irregular rhythm, regular rate at rest.  Systolic murmur present. Lungs: Mild L basilar crackles, reduced basilar lung sounds on R--unchanged.  No further expiratory wheezes.  No respiratory distress. Abdomen: Positive bowel sounds, soft nontender to palpation, nondistended Extremities: No clubbing, cyanosis, or edema Skin: No evidence of breakdown, no evidence of rash Neurologic: Cranial nerves II through XII intact, motor strength is 5-/5 throughout, no apparent sensory deficits. Musculoskeletal: Full range of motion in all 4 extremities. No joint swelling. No calf tenderness.     Assessment/Plan: 1. Functional deficits which require 3+ hours per day of interdisciplinary therapy in a comprehensive inpatient rehab setting. Physiatrist is providing close team supervision and 24 hour management of active medical problems listed below. Physiatrist and rehab team continue to assess barriers to discharge/monitor patient progress toward functional and medical goals  Care Tool:  Bathing    Body parts bathed by patient: Right arm, Chest, Left arm, Abdomen, Front perineal area, Buttocks, Right upper leg, Left upper leg, Right lower leg, Left lower leg, Face         Bathing assist Assist Level: Contact Guard/Touching assist     Upper Body  Dressing/Undressing Upper body dressing   What is the patient wearing?: Pull over shirt    Upper body assist Assist Level: Supervision/Verbal cueing    Lower Body Dressing/Undressing Lower body dressing      What is the patient wearing?: Pants, Underwear/pull up     Lower body assist Assist for lower body dressing: Contact Guard/Touching assist     Toileting Toileting    Toileting assist Assist for toileting: Contact Guard/Touching assist     Transfers Chair/bed transfer  Transfers assist     Chair/bed transfer assist level: Contact Guard/Touching assist     Locomotion Ambulation   Ambulation assist      Assist level: Contact Guard/Touching assist Assistive device: Cane-quad Max distance: 130'   Walk 10 feet activity   Assist     Assist level: Contact Guard/Touching assist Assistive device: Cane-quad   Walk 50 feet activity   Assist    Assist level: Contact Guard/Touching assist Assistive device: Cane-quad    Walk 150 feet activity   Assist Walk 150 feet activity did not occur: Safety/medical concerns (fatigue)         Walk 10 feet on uneven surface  activity   Assist     Assist level: Minimal Assistance - Patient > 75% Assistive device: Other (comment),  Cane-quad (L handrail)   Wheelchair     Assist Is the patient using a wheelchair?: Yes Type of Wheelchair: Manual    Wheelchair assist level: Dependent - Patient 0% Max wheelchair distance: 114ft    Wheelchair 50 feet with 2 turns activity    Assist        Assist Level: Dependent - Patient 0%   Wheelchair 150 feet activity     Assist      Assist Level: Dependent - Patient 0%   Blood pressure 129/81, pulse (!) 102, temperature 98.2 F (36.8 C), resp. rate 16, height 5\' 11"  (1.803 m), weight 75.3 kg, SpO2 99%.  Medical Problem List and Plan: 1. Functional deficits secondary to traumatic SAH fall             -patient may  shower              -ELOS/Goals: PT/OT/SLP Supervision, 10-12 days - 10/10/23 DC             ` Stable to continue CIR  4/19: Poor adherence to LUE NWB, otherwise SPV OT. Low-120s HR with standing and walking. Some cog deficits c/b personality.   2.  Antithrombotics: -DVT/anticoagulation:  Mechanical: Antiembolism stockings, thigh (TED hose) Bilateral lower extremities.  Chronic Eliquis  currently on hold due to [facility]             -antiplatelet therapy: N/A  -4-29: With ongoing tachycardia and shortness of breath off of AC, need to rule out PE.  Discussed with cardiology and nephrology, will get D-dimer --> nuc med scan if positive.  Will also order bilateral lower extremity duplex  4/30; respiratory symptoms resolved, tachycardia improved with treatment of a flutter.  D-dimer was positive, however explained by comorbidities and with low suspicion for PE at this point; DC nuc med scan.  Will still get duplex lower extremities to assess given poor mobility off of AC, prior to placing SCDs for better prevention.  3. Pain Management: LidoDerm  patch, Robaxin  as needed, tramadol  as needed 4. Mood/Behavior/Sleep: Provide emotional support             -antipsychotic agents: Seroquel  25 mg nightly 5. Neuropsych/cognition: This patient is capable of making decisions on his own behalf. 6. Skin/Wound Care: Routine skin checks 7. Fluids/Electrolytes/Nutrition: Routine ins and outs with follow-up chemistries  - 4-28: Very poor p.o. intakes over the weekend, 0% of meals and only 240 cc of p.o. intakes yesterday.  Will get nutrition consult to help evaluate today.  4-30: Eating a little better, at least 1 meal 100 %/day.  8.  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 Neurosurgery follow-up no current plan for surgical intervention however patient is currently maintained on Decadron   therapy for pain control. 9.  Recurrent large right pleural effusion.  Status post ultrasound thoracentesis 1.6 L fluid removed.  - 4-28: Chest x-ray today with recurrence, IR tapped for 825 cc of dark amber fluid.  Follow-up chest x-ray improved.  4/29: CXR for ongoing crackles and opacity yesterday - showing recurrent pleural effusion, opacity from yesterday not well-visualized.  Per cardiology, effusion unlikely due to CHF and would avoid IV diuretics due to renal disease; will need to drain again with recurrence.  10.  PAF.  Rate controlled.  Continue amiodarone .  Eliquis  on hold due to [facility].  - 4-28: rergular tachycardia today, c/b anemia and pleural effusion.  Treat causative sources first. - 4.29: remains tachycardic,  regular rhythm. EKG showing atrial flutter.  Added metoprolol  12.5 twice daily, cardiology consulted, appreciate recommendations. 4-30: Rate improved, patient feeling much better.  Cardiology increased metoprolol  to 25 mg twice daily.  Also increased amiodarone .  Appreciate their insight and recommendations.  11.  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  Follow-up nephrology services.  Renal ultrasound no hydronephrosis.  Currently no plans for HD. Avoid nephrotoxins. Monitor labs. Maintain MAP > 65  - 4-28: Creatinine down to 3.6 from prior 4.5; BUN is increasing.  Hyperkalemic status post transfusion today.  Has been on continuous IVF--Dced today   - 4/29: Per nephro, if contrast needed, avoid ACE/ARB  12.  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  Follow-up orthopedic services Dr. Abigail Abler.    13.  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  Followed by oncology services [name].  Prior to admission patient was receiving Revlimid  5 mg mouth daily for 21 days then hold for 7 days repeating every 28 days  4/28: Hemoglobin steadily downtrending to 7.1 today; given persistent tachycardia and symptoms, will transfuse 1 unit CMV negative today and  follow with 40 mg IV Lasix .  4/29: HgB improved 8.9; monitor  4/30: Down to 7.8 again, WBC down to 3.6. H/H, FOBT ordered. --H&H 8.5, unchanged  14.  Hypothyroidism.  Synthroid  15.  Hyperlipidemia.  Zocor  16.  History of BPH.  Check PVR--no retention per documentation. 17.  GERD.  Protonix  18.  Hypotension/hypertension.  Followed by renal services with plan to slowly wean ProAmatine . Vitals:   10/04/23 0000 10/04/23 0508  BP: 118/82 129/81  Pulse: (!) 102   Resp: 16 16  Temp: 98.8 F (37.1 C) 98.2 F (36.8 C)  SpO2: 98% 99%   Supine systolic BPs mildly elevated will order ortho vitals to see if midodrine  can be reduced  -4/28: Severe hypertension overnight.  Reduce midodrine  to 5 mg twice daily at 8 AM and noon, get orthostatic vitals today. Encourage PO fluids--remained hypertensive today, decrease midodrine  to 2.5 mg twice daily tomorrow a.m.  - 4/29: Remains elevated, DC midodrine .  - 4-30: Normotensive off of midodrine , monitor  19. Occasional cough, reports started couple hours ago             -Start robitussin              -Repeat Xray chest if does not improve  - 4-28: Chest x-ray today with pleural effusion, tapped as above.  Remaining right middle lobe opacity.  No fevers, WBC stable.  Monitor respiratory status following effusion tap.   - 4.29: see above; remains afebrile, WBC stable - monitor  20. Leukopenia- resolved             -Oncology s/p  GCSF, CBC daily    Latest Ref Rng & Units 10/04/2023    5:13 AM 10/03/2023    7:09 AM 10/02/2023    5:23 AM  CBC  WBC 4.0 - 10.5 K/uL 3.6  5.3  4.6   Hemoglobin 13.0 - 17.0 g/dL 7.8  8.9  7.1   Hematocrit 39.0 - 52.0 % 25.1  28.2  22.8   Platelets 150 - 400 K/uL 217  269  256     - 4-30: Recurrent.  Continue daily CBC.  21. L knee OA             -Xray nov 2024 indicates moderate OA             -  Muscle rub prn     22.  Left AC joint disruption - Nonweightbearing left upper extremity with shoulder sling for comfort.    -  4/29: Per ortho OK for WBAT; order changed  23.  Hyperkalemia.  5.8.  Likely secondary to blood transfusion today, already got 40 mg of IV Lasix  for treatment.  Repeat BMP in a.m., continue IV fluids.  - Ensure changed to Nepro  - 4/29: Cr stable, K improved 5.3. Will give Lokelma  to avoid diuretics. Repeat in AM. EKG w/o T wave changes  4-30: K remains 5.3, no bowel movement yesterday.  Will give another dose Lokelma  5 mg x 1, and increase bowel regimen for effect.   5 days A FACE TO FACE EVALUATION WAS PERFORMED  Bea Lime 10/04/2023, 7:26 AM

## 2023-10-04 NOTE — Progress Notes (Signed)
 Physical Therapy Session Note  Patient Details  Name: [name] MRN: [id number] Date of Birth: [birth year]  Today's Date: 10/04/2023 PT Individual Time: 1002-1045 PT Individual Time Calculation (min): 43 min   Short Term Goals: Week 1:  PT Short Term Goal 1 (Week 1): pt will perform all transfers with LRAD and CGA PT Short Term Goal 2 (Week 1): pt will ambulate 155ft with LRAD and CGA PT Short Term Goal 3 (Week 1): pt will navigate 4 6in steps with 1 handrail and CGA  Skilled Therapeutic Interventions/Progress Updates:     Pt received supine in bed and agrees to therapy. No complaint of pain. Supine to sit with bed features. Pt performs sit to stand with RW and cues for hand placement and initiation. Pt ambulates x150' to gym with RW and cues for upright gaze to improve posture and balance, and increasing stride length and gait speed to decrease risk for falls. Pt HR at 118 bpm following bout of ambulation. Following rest break, pt stands and completes x10 standing hip abductions with each leg, then x10 high knee marches with each leg. PT provides verbal and tactile cues for NM feedback and correct performance. Seated rest break. Pt completes x20 standing heel raises with BUE support on RW for balance and strength training. Pt ambulates x175' with RW and same cues and assistance provided. PT asks pt if he could continue ambulating after 175' and pt states that he feels like he needs a rest break, and rates exertion at 8/10. Seated rest break prior to ambulating additional x175'. Pt completes standing activity with cues to maintain contact with buttocks and shoulders with column to promote optimal body mechanics and posture, then tasked with performing x20 toe raises to target dorsiflexors. Following seated rest break, pt ambulates x190' back to room with RW and same assistance and cues, and transfers onto elevated toilet seate with cues for positioning and hand placement. Pt left seated on toilet  with call bell in reach and RN aware.   Therapy Documentation Precautions:  Precautions Precautions: Fall Required Braces or Orthoses: Sling Restrictions Weight Bearing Restrictions Per Provider Order: Yes LUE Weight Bearing Per Provider Order: Non weight bearing Other Position/Activity Restrictions: Lt shoulder NWB, gentle ROM, sling for comfort. C2 ALL injury   Therapy/Group: Individual Therapy  Neva Barban, PT, DPT 10/04/2023, 4:00 PM

## 2023-10-04 NOTE — Telephone Encounter (Signed)
 Requested medication (s) are due for refill today: No  Requested medication (s) are on the active medication list: No  Last refill:  06/09/23  Future visit scheduled: No  Notes to clinic:  Patient currently in rehab, med not on current list     Requested Prescriptions  Pending Prescriptions Disp Refills   gabapentin  (NEURONTIN ) 400 MG capsule [Pharmacy Med Name: GABAPENTIN  400 MG CAP] 270 capsule     Sig: TAKE 1 CAPSULE BY MOUTH EVERY MORNING AND TAKE 2 CAPSULES AT BEDTIME     Neurology: Anticonvulsants - gabapentin  Failed - 10/04/2023  1:27 PM      Failed - Cr in normal range and within 360 days    Creatinine  Date Value Ref Range Status  09/13/2023 3.72 (H) 0.61 - 1.24 mg/dL Final  16/03/9603 5.40 0.60 - 1.30 mg/dL Final   Creatinine, Ser  Date Value Ref Range Status  10/04/2023 3.51 (H) 0.61 - 1.24 mg/dL Final         Passed - Completed PHQ-2 or PHQ-9 in the last 360 days      Passed - Valid encounter within last 12 months    Recent Outpatient Visits           1 month ago Benign hypertensive renal disease   [place] [facility] [place], Megan P, DO   2 months ago Sacral pain   [place] [facility] [name], DO       Future Appointments             In 1 month Elta Halter, MD [facility]

## 2023-10-04 NOTE — Progress Notes (Addendum)
 Speech Language Pathology Daily Session Note  Patient Details  Name: [name] MRN: [id number] Date of Birth: 11-10-1940  Today's Date: 10/04/2023 SLP Individual Time: 1430-1530 SLP Individual Time Calculation (min): 60 min  Short Term Goals: Week 1: SLP Short Term Goal 1 (Week 1): Patient will recall and utilize memory compensatory aids given min multimodal A SLP Short Term Goal 2 (Week 1): Patient will demonstrate functional problem solving skills in mildly complex situations given min multimodal A SLP Short Term Goal 3 (Week 1): Patient will recall cognitive and physical changes since admission given min multimodal A  Skilled Therapeutic Interventions:   Pt greeted at bedside. Limited initiation and flat affect remain, but he was overall cooperative throughout tx tasks targeting cognition. He was able to recall detailed events of the day w/ only s cues. Additionally, pt challenged to recall task w/ 4 words. He immediately recalled 4/4 words, recalled 3/4 after 8 min delay, and 4/4 after an additional 5 min delay. Between recall times he completed written tasks targeting organization, problem solving, and attention to details. He required minA for problem solving and mental flexibility during word completion task w/ provided category. He required only cue x1 during information processing task in table format. During his final tx task, he independently utilized a calendar during information processing task. He continues to demonstrate reduced insight into deficits, however, anticipate baseline personality and reduced mental flexibility will continue to negatively impact emergent and anticipatory awareness. At the end of tx tasks, he was left in bed with the alarm set and call light within reach. Recommend cont ST.    Pain  No pain reported  Therapy/Group: Individual Therapy  Rozell Cornet 10/04/2023, 2:52 PM

## 2023-10-04 NOTE — Progress Notes (Signed)
 Occupational Therapy Session Note  Patient Details  Name: [name] MRN: [id number] Date of Birth: 1941/01/20  Today's Date: 10/04/2023 OT Individual Time: 0832-0930 OT Individual Time Calculation (min): 58 min    Short Term Goals: Week 1:  OT Short Term Goal 1 (Week 1): STG= LTG d/t ELOS  Skilled Therapeutic Interventions/Progress Updates:    Pt received supine with no c/o pain, agreeable to OT session. Discussed new WBAT orders in the LUE. Pt requesting RW and large base quad cane be ordered for home use. Explained that usually only one will be covered by insurance so provided him with a printed link to one on [place], per his request. He came to EOB with (S). He used the RW to complete 125 ft of functional mobility with CGA. Skilled monitoring of vitals throughout session to ensure hemodynamic and cardiorespiratory stability. HR tachy but stable around 120 bpm. Notified RN that pt has not received morning medications. He completed standing level functional stepping activity, challenging narrow stance to increase dynamic standing balance for reduced fall risk. He required consistent min A at the trunk with poor righting reactions. He then completed 3 trials, 70 ft, 70 ft, and 120 ft with no assistive device to challenge dynamic standing balance. He required light CGA- min A overall. Reinforced that pt should not do this at home and should use recommended AD. He was left supine with all needs met, bed alarm set.   Therapy Documentation Precautions:  Precautions Precautions: Fall Required Braces or Orthoses: Sling Restrictions Weight Bearing Restrictions Per Provider Order: Yes LUE Weight Bearing Per Provider Order: Non weight bearing Other Position/Activity Restrictions: Lt shoulder NWB, gentle ROM, sling for comfort. C2 ALL injury  Therapy/Group: Individual Therapy  Una Ganser 10/04/2023, 6:39 AM

## 2023-10-04 NOTE — Progress Notes (Signed)
 Occupational Therapy Session Note  Patient Details  Name: [name] MRN: [id number] Date of Birth: [birth year]  Today's Date: 10/04/2023 OT Individual Time: 1306-1401 OT Individual Time Calculation (min): 55 min    Short Term Goals: Week 1:  OT Short Term Goal 1 (Week 1): STG= LTG d/t ELOS  Skilled Therapeutic Interventions/Progress Updates:  Pt greeted supine in bed, pt agreeable to OT intervention.      Transfers/bed mobility/functional mobility: pt completed supine>sit with MIN A to elevate trunk. Pt completed stand pivot with RW and CGA.   Therapeutic activity:  Pt completed Connect 4 game with an emphasis on blocked practice of sit>stands with RW. Pt completed a total A of 7 sit>stands with RW and CGA. Pt using RUE to manipulate game pieces.  Pt completed short distance functional mobility task with pt instructed to ambulate ~ 62ft with RW to complete dual task of remembering numbers on one table and then ambulating to next table to try to make the sum of the number, I.e using 9+3+5 to equal 17. Pt completed task with 100% accuracy challenging working memory. Graded task up and instructed pt to ambulate in between cones to practice tight turns with RW.  Pt completed adynamic balance task with pt instructed to stand on compliant balance board and use LUE to retrieve clothespins across midline and then pin to net of basketball goal to provide gentle ROM to LUE. Pt completed task with MIN A for balance, mild posterior lean noted at first with pt needing cues for feet positioning.     ADLs:   Transfers: ambulatory transfer into bathroom with RW and CGA.  Toileting: pt with continent b/b, pt required MAX A for 3/3 toileting tasks.   Exercises: pt completed below BLE therex to provide HIIT workout to improve cardiovascular endurance. Pt completed below therex with BUE support from RW and CGA.  1 min of sit>stands 1 min of alternating toe taps 1 min of steps ups  Vitals: HR max 110  bpm                Ended session with pt supine in bed with all needs within reach and bed alarm activated.                   Therapy Documentation Precautions:  Precautions Precautions: Fall Required Braces or Orthoses: Sling Restrictions Weight Bearing Restrictions Per Provider Order: No LUE Weight Bearing Per Provider Order: Non weight bearing Other Position/Activity Restrictions: Lt shoulder NWB, gentle ROM, sling for comfort. C2 ALL injury  Pain: No pain    Therapy/Group: Individual Therapy  Willadean Hark 10/04/2023, 3:15 PM

## 2023-10-05 ENCOUNTER — Inpatient Hospital Stay (HOSPITAL_COMMUNITY)

## 2023-10-05 DIAGNOSIS — S066X0A Traumatic subarachnoid hemorrhage without loss of consciousness, initial encounter: Secondary | ICD-10-CM | POA: Diagnosis not present

## 2023-10-05 LAB — CBC WITH DIFFERENTIAL/PLATELET
Abs Immature Granulocytes: 0.62 10*3/uL — ABNORMAL HIGH (ref 0.00–0.07)
Basophils Absolute: 0 10*3/uL (ref 0.0–0.1)
Basophils Relative: 1 %
Eosinophils Absolute: 0 10*3/uL (ref 0.0–0.5)
Eosinophils Relative: 0 %
HCT: 23.8 % — ABNORMAL LOW (ref 39.0–52.0)
Hemoglobin: 7.5 g/dL — ABNORMAL LOW (ref 13.0–17.0)
Immature Granulocytes: 16 %
Lymphocytes Relative: 19 %
Lymphs Abs: 0.7 10*3/uL (ref 0.7–4.0)
MCH: 28.4 pg (ref 26.0–34.0)
MCHC: 31.5 g/dL (ref 30.0–36.0)
MCV: 90.2 fL (ref 80.0–100.0)
Monocytes Absolute: 0.4 10*3/uL (ref 0.1–1.0)
Monocytes Relative: 11 %
Neutro Abs: 2 10*3/uL (ref 1.7–7.7)
Neutrophils Relative %: 53 %
Platelets: 194 10*3/uL (ref 150–400)
RBC: 2.64 MIL/uL — ABNORMAL LOW (ref 4.22–5.81)
RDW: 18.4 % — ABNORMAL HIGH (ref 11.5–15.5)
Smear Review: NORMAL
WBC: 3.8 10*3/uL — ABNORMAL LOW (ref 4.0–10.5)
nRBC: 0 % (ref 0.0–0.2)

## 2023-10-05 LAB — BASIC METABOLIC PANEL WITH GFR
Anion gap: 10 (ref 5–15)
BUN: 93 mg/dL — ABNORMAL HIGH (ref 8–23)
CO2: 20 mmol/L — ABNORMAL LOW (ref 22–32)
Calcium: 7.2 mg/dL — ABNORMAL LOW (ref 8.9–10.3)
Chloride: 109 mmol/L (ref 98–111)
Creatinine, Ser: 3.28 mg/dL — ABNORMAL HIGH (ref 0.61–1.24)
GFR, Estimated: 18 mL/min — ABNORMAL LOW (ref >60)
Glucose, Bld: 92 mg/dL (ref 70–99)
Potassium: 5.1 mmol/L (ref 3.5–5.1)
Sodium: 139 mmol/L (ref 135–145)

## 2023-10-05 LAB — GLUCOSE, CAPILLARY
Glucose-Capillary: 105 mg/dL — ABNORMAL HIGH (ref 70–99)
Glucose-Capillary: 181 mg/dL — ABNORMAL HIGH (ref 70–99)

## 2023-10-05 NOTE — Progress Notes (Signed)
 PROGRESS NOTE   Subjective/Complaints: No complaints overnight.  No acute complaints.  Feels overall much better, less fatigued than prior days.  Denies any shortness of breath or cough.  Heart rate intermittently into the 110s, otherwise vitals looking better. Hemoglobin back down to 7.5 today, BMP tomorrow a.m.  Did have a bowel Movement yesterday.   ROS: Positives per HPI above. Denies fevers, chills, N/V, abdominal pain, chest pain, new weakness or paraesthesias.   Positive for tachycardia--improving  Objective:   DG CHEST PORT 1 VIEW Result Date: 10/03/2023 CLINICAL DATA:  142230 Pleural effusion 142230 EXAM: PORTABLE CHEST - 1 VIEW COMPARISON:  October 02, 2023 FINDINGS: Small right pleural effusion with right basilar atelectasis. Moderate cardiomegaly. Tortuous aorta with aortic atherosclerosis. No acute fracture or destructive lesions. Multilevel thoracic osteophytosis. Right glenohumeral joint arthroplasty. IMPRESSION: 1. Worsening, small right pleural effusion with right basilar atelectasis. 2. The nodular opacity in the right midlung on the prior radiograph is not well visualized. Electronically Signed   By: Rance Burrows M.D.   On: 10/03/2023 14:39   Recent Labs    10/04/23 0513 10/04/23 1003 10/05/23 0549  WBC 3.6*  --  3.8*  HGB 7.8* 8.5* 7.5*  HCT 25.1* 27.9* 23.8*  PLT 217  --  194   Recent Labs    10/03/23 0709 10/04/23 0513  NA 139 138  K 5.3* 5.3*  CL 110 110  CO2 19* 19*  GLUCOSE 88 105*  BUN 98* 98*  CREATININE 3.71* 3.51*  CALCIUM  7.6* 7.3*    Intake/Output Summary (Last 24 hours) at 10/05/2023 1014 Last data filed at 10/05/2023 0800 Gross per 24 hour  Intake 960 ml  Output 200 ml  Net 760 ml         Physical Exam: Vital Signs Blood pressure 136/86, pulse (!) 114, temperature 97.7 F (36.5 C), temperature source Oral, resp. rate 18, height 5\' 11"  (1.803 m), weight 75.3 kg, SpO2  97%.  General: No acute distress.  Laying in bed.  Mood and affect are appropriate Heart: Irregular rhythm, regular rate at rest.  Systolic murmur present. Lungs: Mild L basilar crackles, improved, otherwise clear to auscultation bilaterally.  No further expiratory wheezes.  No respiratory distress. Abdomen: Positive bowel sounds, soft nontender to palpation, nondistended Extremities: No clubbing, cyanosis, or edema Skin: No evidence of breakdown, no evidence of rash Neurologic: Cranial nerves II through XII intact, motor strength is 5-/5 throughout, no apparent sensory deficits. Musculoskeletal: Full range of motion in all 4 extremities. No joint swelling. No calf tenderness.     Assessment/Plan: 1. Functional deficits which require 3+ hours per day of interdisciplinary therapy in a comprehensive inpatient rehab setting. Physiatrist is providing close team supervision and 24 hour management of active medical problems listed below. Physiatrist and rehab team continue to assess barriers to discharge/monitor patient progress toward functional and medical goals  Care Tool:  Bathing    Body parts bathed by patient: Right arm, Chest, Left arm, Abdomen, Front perineal area, Buttocks, Right upper leg, Left upper leg, Right lower leg, Left lower leg, Face         Bathing assist Assist Level: Contact Guard/Touching assist  Upper Body Dressing/Undressing Upper body dressing   What is the patient wearing?: Pull over shirt    Upper body assist Assist Level: Supervision/Verbal cueing    Lower Body Dressing/Undressing Lower body dressing      What is the patient wearing?: Pants, Underwear/pull up     Lower body assist Assist for lower body dressing: Contact Guard/Touching assist     Toileting Toileting    Toileting assist Assist for toileting: Contact Guard/Touching assist     Transfers Chair/bed transfer  Transfers assist     Chair/bed transfer assist level: Contact  Guard/Touching assist     Locomotion Ambulation   Ambulation assist      Assist level: Contact Guard/Touching assist Assistive device: Walker-rolling Max distance: 190'   Walk 10 feet activity   Assist     Assist level: Contact Guard/Touching assist Assistive device: Walker-rolling   Walk 50 feet activity   Assist    Assist level: Contact Guard/Touching assist Assistive device: Walker-rolling    Walk 150 feet activity   Assist Walk 150 feet activity did not occur: Safety/medical concerns (fatigue)  Assist level: Contact Guard/Touching assist Assistive device: Walker-rolling    Walk 10 feet on uneven surface  activity   Assist     Assist level: Minimal Assistance - Patient > 75% Assistive device: Other (comment), Cane-quad (L handrail)   Wheelchair     Assist Is the patient using a wheelchair?: Yes Type of Wheelchair: Manual    Wheelchair assist level: Dependent - Patient 0% Max wheelchair distance: 166ft    Wheelchair 50 feet with 2 turns activity    Assist        Assist Level: Dependent - Patient 0%   Wheelchair 150 feet activity     Assist      Assist Level: Dependent - Patient 0%   Blood pressure 136/86, pulse (!) 114, temperature 97.7 F (36.5 C), temperature source Oral, resp. rate 18, height 5\' 11"  (1.803 m), weight 75.3 kg, SpO2 97%.  Medical Problem List and Plan: 1. Functional deficits secondary to traumatic SAH fall             -patient may  shower             -ELOS/Goals: PT/OT/SLP Supervision, 10-12 days - 10/10/23 DC             ` Stable to continue CIR  4/19: Poor adherence to LUE NWB, otherwise SPV OT. Low-120s HR with standing and walking. Some cog deficits c/b personality.   2.  Antithrombotics: -DVT/anticoagulation:  Mechanical: Antiembolism stockings, thigh (TED hose) Bilateral lower extremities.  Chronic Eliquis  currently on hold due to [facility]             -antiplatelet therapy: N/A  -4-29: With ongoing  tachycardia and shortness of breath off of AC, need to rule out PE.  Discussed with cardiology and nephrology, will get D-dimer --> nuc med scan if positive.  Will also order bilateral lower extremity duplex  4/30; respiratory symptoms resolved, tachycardia improved with treatment of a flutter.  D-dimer was positive, however explained by comorbidities and with low suspicion for PE at this point; DC nuc med scan.  Will still get duplex lower extremities to assess given poor mobility off of AC, prior to placing SCDs for better prevention.--Pending  3. Pain Management: LidoDerm  patch, Robaxin  as needed, tramadol  as needed 4. Mood/Behavior/Sleep: Provide emotional support             -antipsychotic agents: Seroquel  25 mg  nightly 5. Neuropsych/cognition: This patient is capable of making decisions on his own behalf. 6. Skin/Wound Care: Routine skin checks 7. Fluids/Electrolytes/Nutrition: Routine ins and outs with follow-up chemistries  - 4-28: Very poor p.o. intakes over the weekend, 0% of meals and only 240 cc of p.o. intakes yesterday.  Will get nutrition consult to help evaluate today.  4-30: Eating a little better, at least 1 meal 100 %/day.  8.  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 Neurosurgery follow-up no current plan for surgical intervention however patient is currently maintained on Decadron  therapy for pain control. 9.  Recurrent large right pleural effusion.  Status post ultrasound thoracentesis 1.6 L fluid removed.  - 4-28: Chest x-ray today with recurrence, IR tapped for 825 cc of dark amber fluid.  Follow-up chest x-ray improved.  4/29: CXR for ongoing crackles and opacity yesterday - showing recurrent pleural effusion, opacity from yesterday not well-visualized.  Per cardiology, effusion unlikely due to CHF and would avoid IV  diuretics due to renal disease; will need to drain again with recurrence.  5-1: Per [name], request pulmonary consultation prior to discharge and, if effusion is retapped, flow cytometry testing.  Will get repeat chest x-ray in a.m. for monitoring, otherwise seems stable from a respiratory standpoint at this point so no emergent tap.  10.  PAF.  Rate controlled.  Continue amiodarone .  Eliquis  on hold due to [facility].  - 4-28: rergular tachycardia today, c/b anemia and pleural effusion.  Treat causative sources first. - 4.29: remains tachycardic,  regular rhythm. EKG showing atrial flutter.  Added metoprolol  12.5 twice daily, cardiology consulted, appreciate recommendations. 4-30: Rate improved, patient feeling much better.  Cardiology increased metoprolol  to 25 mg twice daily.  Also increased amiodarone .  Appreciate their insight and recommendations. 5-1, 5/2: Heart rate looking better.  Appreciate cardiology assistance.  11.  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  Follow-up nephrology services.  Renal ultrasound no hydronephrosis.  Currently no plans for HD. Avoid nephrotoxins. Monitor labs. Maintain MAP > 65  - 4-28: Creatinine down to 3.6 from prior 4.5; BUN is increasing.  Hyperkalemic status post transfusion today.  Has been on continuous IVF--Dced today   - 4/29: Per nephro, if contrast needed, avoid ACE/ARB  12.  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  Follow-up orthopedic services Dr. Abigail Abler.    13.  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  Followed by oncology services [name].  Prior to admission patient was receiving Revlimid  5 mg mouth daily for 21 days then hold for 7 days repeating every 28 days  4/28: Hemoglobin steadily downtrending to 7.1 today; given persistent tachycardia and symptoms, will transfuse 1 unit CMV negative today and follow with 40 mg IV Lasix .  4/29: HgB improved 8.9; monitor  4/30: Down to 7.8 again, WBC down to 3.6. H/H, FOBT  ordered. --H&H 8.5, unchanged  5/1: HgB 7.5, WBC stable 3.8. FOBT negative.  Spoke with oncology on-call and [name].  Per [name], no need for daily monitoring, expect patient to continue to drop his hemoglobin, would transfuse once just prior to discharge and follow-up with her shortly after; already scheduled.  Additionally, requesting pulmonary workup as above.  14.  Hypothyroidism.  Synthroid  15.  Hyperlipidemia.  Zocor  16.  History of BPH.  Check PVR--no retention per documentation. 17.  GERD.  Protonix  18.  Hypotension/hypertension.  Followed by renal services with plan to slowly wean ProAmatine . Vitals:   10/05/23 0330 10/05/23 0745  BP: (!) 149/76 136/86  Pulse: 71 (!) 114  Resp: 16 18  Temp: 98.3 F (36.8 C) 97.7 F (36.5 C)  SpO2: 97% 97%   Supine systolic BPs mildly elevated will order ortho vitals to see if midodrine  can be reduced  -4/28: Severe hypertension overnight.  Reduce midodrine  to 5 mg twice daily at 8 AM and noon, get orthostatic vitals today. Encourage PO fluids--remained hypertensive today, decrease midodrine  to 2.5 mg twice daily tomorrow a.m.  - 4/29: Remains elevated, DC midodrine .  - 4-30: Normotensive off of midodrine , monitor  19. Occasional cough, reports started couple hours ago             -Start robitussin              -Repeat Xray chest if does not improve  - 4-28: Chest x-ray today with pleural effusion, tapped as above.  Remaining right middle lobe opacity.  No fevers, WBC stable.  Monitor respiratory status following effusion tap.   - 4.29: see above; remains afebrile, WBC stable - monitor  20. Leukopenia- resolved             -Oncology s/p  GCSF, CBC daily    Latest Ref Rng & Units 10/05/2023    5:49 AM 10/04/2023   10:03 AM 10/04/2023    5:13 AM  CBC  WBC 4.0 - 10.5 K/uL 3.8   3.6   Hemoglobin 13.0 - 17.0 g/dL 7.5  8.5  7.8   Hematocrit 39.0 - 52.0 % 23.8  27.9  25.1   Platelets 150 - 400 K/uL 194   217     - 4-30: Recurrent.  Continue  daily CBC.--stable  - 5-1: Per chart review and discussion with patient, was no longer on daily standing prednisone  10 mg at admission.  DC'd this today.   21. L knee OA             -Xray nov 2024 indicates moderate OA             -Muscle rub prn     22.  Left AC joint disruption - Nonweightbearing left upper extremity with shoulder sling for comfort.    - 4/29: Per ortho OK for WBAT; order changed  23.  Hyperkalemia.  5.8.  Likely secondary to blood transfusion today, already got 40 mg of IV Lasix  for treatment.  Repeat BMP in a.m., continue IV fluids.  - Ensure changed to Nepro  - 4/29: Cr stable, K improved 5.3. Will give Lokelma  to avoid diuretics. Repeat in AM. EKG w/o T wave changes  4-30: K remains 5.3, no bowel movement yesterday.  Will give another dose Lokelma  5 mg x 1, and increase bowel regimen for effect.  5/1: BM yesterday, BMP pending--resolved, 5.1.   6 days A FACE TO FACE EVALUATION WAS PERFORMED  [name] 10/05/2023, 10:14 AM

## 2023-10-05 NOTE — Progress Notes (Signed)
 Speech Language Pathology Daily Session Note  Patient Details  Name: MERLE RAJENDRAN MRN: [id number] Date of Birth: December 21, 1940  Today's Date: 10/06/2023 SLP Individual Time: 1330-1430 SLP Individual Time Calculation (min): 60 min     Short Term Goals: Week 1: SLP Short Term Goal 1 (Week 1): Patient will recall and utilize memory compensatory aids given min multimodal A SLP Short Term Goal 2 (Week 1): Patient will demonstrate functional problem solving skills in mildly complex situations given min multimodal A SLP Short Term Goal 3 (Week 1): Patient will recall cognitive and physical changes since admission given min multimodal A  Skilled Therapeutic Interventions:   Pt greeted at bedside. He was awake/alert upon SLP arrival and cooperative throughout tx tasks targeting cognition. Pt completed a money management task via menu, requiring only s cues for attention to detail and calculations. Of note, pt typically utilizes a calculator at home for calculations but reported he was unable to do so at this time because of weakness in his R hand. He also completed a problem solving and processing task filling in missing letters to complete a word. With additional processing time, he independently completed the task. At the end of tx tasks, he was left in bed with the alarm set and call light within reach. Anticipate pt is near or at his cognitive baseline and will likely require only 1-2 more ST tx sessions to ensure continued success and completion of pt/family edu.   Pain  No pain reported  Therapy/Group: Individual Therapy  Rozell Cornet 10/05/2023, 1:47 PM

## 2023-10-05 NOTE — Plan of Care (Signed)
 Problem: Education: Goal: Knowledge of General Education information will improve Description: Including pain rating scale, medication(s)/side effects and non-pharmacologic comfort measures Outcome: Progressing Outcome: Progressing   Problem: Health Behavior/Discharge Planning: Goal: Ability to manage health-related needs will improve Outcome: Progressing Outcome: Progressing   Problem: Clinical Measurements: Goal: Ability to maintain clinical measurements within normal limits will improve Outcome: Progressing Outcome: Progressing  Goal: Will remain free from infection Outcome: Progressing Outcome: Progressing  Goal: Diagnostic test results will improve Outcome: Progressing Outcome: Progressing  Goal: Respiratory complications will improve Outcome: Progressing Outcome: Progressing  Goal: Cardiovascular complication will be avoided Outcome: Progressing Outcome: Progressing   Problem: Activity: Goal: Risk for activity intolerance will decrease 09/29/2023 1010 by Diamond Formica, RN Outcome: Progressing 09/29/2023 1010 by Diamond Formica, RN Outcome: Progressing   Problem: Nutrition: Goal: Adequate nutrition will be maintained Outcome: Progressing Outcome: Progressing   Problem: Coping: Goal: Level of anxiety will decrease Outcome: Progressing Outcome: Progressing   Problem: Elimination: Goal: Will not experience complications related to bowel motility Outcome: Progressing Outcome: Progressing  Goal: Will not experience complications related to urinary retention Outcome: Progressing Outcome: Progressing   Problem: Pain Managment: Goal: General experience of comfort will improve and/or be controlled Outcome: Progressing Outcome: Progressing   Problem: Safety: Goal: Ability to remain free from injury will improve Outcome: Progressing Outcome: Progressing   Problem: Education: Goal: Knowledge of disease or condition will improve Outcome: Progressing Outcome:  Progressing  Goal: Knowledge of secondary prevention will improve (MUST DOCUMENT ALL) Outcome: Progressing Outcome: Progressing  Goal: Knowledge of patient specific risk factors will improve (DELETE if not current risk factor) Outcome: Progressing Outcome: Progressing   Problem: Ischemic Stroke/TIA Tissue Perfusion: Goal: Complications of ischemic stroke/TIA will be minimized Outcome: Progressing Outcome: Progressing   Problem: Coping: Goal: Will identify appropriate support needs Outcome: Progressing Outcome: Progressing   Problem: Self-Care: Goal: Ability to participate in self-care as condition permits will improve Outcome: Progressing Outcome: Progressing  Goal: Verbalization of feelings and concerns over difficulty with self-care will improve Outcome: Progressing Outcome: Progressing

## 2023-10-05 NOTE — Progress Notes (Signed)
 Physical Therapy Session Note  Patient Details  Name: [name] MRN: [id number] Date of Birth: 1941-01-14  Today's Date: 10/05/2023 PT Individual Time: 0915-0959 PT Individual Time Calculation (min): 44 min  and Today's Date: 10/05/2023 PT Missed Time: 45 Minutes Missed Time Reason: Patient fatigue  Short Term Goals: Week 1:  PT Short Term Goal 1 (Week 1): pt will perform all transfers with LRAD and CGA PT Short Term Goal 2 (Week 1): pt will ambulate 153ft with LRAD and CGA PT Short Term Goal 3 (Week 1): pt will navigate 4 6in steps with 1 handrail and CGA  Skilled Therapeutic Interventions/Progress Updates:     Pt received seated in dayroom with OT. Handed off to PT and pt agrees to therapy with no complaint of pain. PT and pt have discussion about mobility requirements with regard to pt's desire to return to work. Pt states that he has a golf cart and other motorized vehicles to assist with mobilizing around work sites (pt works as a Surveyor, [employer]), but likely will need to ambulate longer distances than pt is currently completing at CIR. Session initially focuses on increasing gait distance and improving endurance. Pt requires two attempts to stand from chair due to lack of anterior weight shift, then ambulates x250' with RW and CGA, with cues for decreased WB through UE to improve energy conservation and body mechanics. Following rest break, pt ambulates x125' back to room to urinate in standing, working on balance and endurance. Pt requires minA for sit to stand from chair, with PT facilitate trunk extension and powering up. Pt takes seated rest break. Pt completes x5 reps sit to stand from hard backed chair with BUE support on armrests, not requiring physical assistance to complete. Activity progressed by having pt perform 2x5 with single UE support on armrests and contralateral hand on RW. Pt transitions back to bed with cues for positioning. Left supine with all needs within reach.   2nd  Session: Pt asleep upon PT arrival. PT will follow up as able.   Therapy Documentation Precautions:  Precautions Precautions: Fall Required Braces or Orthoses: Sling Restrictions Weight Bearing Restrictions Per Provider Order: No LUE Weight Bearing Per Provider Order: Non weight bearing Other Position/Activity Restrictions: Lt shoulder NWB, gentle ROM, sling for comfort. C2 ALL injury   Therapy/Group: Individual Therapy  Neva Barban, PT, DPT 10/05/2023, 4:23 PM

## 2023-10-05 NOTE — Progress Notes (Signed)
 Occupational Therapy Session Note  Patient Details  Name: [name] MRN: [id number] Date of Birth: 29-Apr-1941  Today's Date: 10/05/2023 OT Individual Time: 6295-2841 OT Individual Time Calculation (min): 60 min    Short Term Goals: Week 1:  OT Short Term Goal 1 (Week 1): STG= LTG d/t ELOS Week 2:     Skilled Therapeutic Interventions/Progress Updates:    1:! Pt is now WBAT and using RW - updated safety plan for team to reinforce practice with the RW with ambulation to and from the bathroom.  PT asleep when arrived and reported he didn't sleep well the night before and had lots of bouts with incontinence. Pt able to come to EOB with supervision and ambulated with RW to the bathroom with CGA. PT able to pull pants down but requested assistance after BM for hygiene. Pt able to doff shirt in standing without UE support nor demonstrating a LOB. Pt transitioned into shower with RW with CGA and bathed with min A for assistance to periarea and buttocks to ensure cleanliness. Pt did use his left hand for most tasks. Pt ambulated to EOB to dress. PT able to dress all parts with CGA for balance. Pt sits in figure four position to don socks. Pt did don shoes today.   Pt ambulated to sink to perform grooming tasks in sitting. Pt reports he feels like his legs are weak. However pt able to demonstrate safe use of the RW to ambulate from his room to the dayroom and sit in a chair by the window. Pt reports the same amount of fatigue than when in the room. 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 Pt left with the next therapist.   Therapy Documentation Precautions:  Precautions Precautions: Fall Required Braces or Orthoses: Sling Restrictions Weight Bearing Restrictions Per Provider Order: No LUE Weight Bearing Per Provider Order: Non weight bearing Other Position/Activity Restrictions:  Lt shoulder NWB, gentle ROM, sling for comfort. C2 ALL injury General:   Vital Signs: Therapy Vitals Temp: 97.7 F (36.5 C) Temp Source: Oral Pulse Rate: (!) 114 Resp: 18 BP: 136/86 Patient Position (if appropriate): Lying Oxygen Therapy SpO2: 97 % O2 Device: Room Air Patient Activity (if Appropriate): In bed Pulse Oximetry Type: Intermittent Pain:   ADL: ADL Eating: Supervision/safety Where Assessed-Eating: Chair Grooming: Supervision/safety Where Assessed-Grooming: Sitting at sink Upper Body Bathing: Supervision/safety Where Assessed-Upper Body Bathing: Sitting at sink Lower Body Bathing: Minimal assistance Where Assessed-Lower Body Bathing: Sitting at sink Upper Body Dressing: Supervision/safety Where Assessed-Upper Body Dressing: Sitting at sink Lower Body Dressing: Moderate assistance Where Assessed-Lower Body Dressing: Sitting at sink Toileting: Moderate assistance Where Assessed-Toileting: Teacher, adult education: Curator Method: Ambulating Vision   Perception    Praxis   Balance   Exercises:   Other Treatments:     Therapy/Group: Individual Therapy  Henrene Locust [facility] 10/05/2023, 8:33 AM

## 2023-10-05 NOTE — Progress Notes (Signed)
 Subjective:  Denies SSCP, palpitations or Dyspnea Cognitive defects ECG at beside Rates good in 80's   Objective:  Vitals:   10/05/23 0040 10/05/23 0330 10/05/23 0500 10/05/23 0745  BP: 138/75 (!) 149/76  136/86  Pulse: 66 71  (!) 114  Resp: 16 16  18   Temp: 98.3 F (36.8 C) 98.3 F (36.8 C)  97.7 F (36.5 C)  TempSrc: Oral Oral  Oral  SpO2: 99% 97%  97%  Weight:   75.3 kg   Height:        Intake/Output from previous day:  Intake/Output Summary (Last 24 hours) at 10/05/2023 0916 Last data filed at 10/05/2023 0800 Gross per 24 hour  Intake 960 ml  Output 200 ml  Net 760 ml    Physical Exam: Mild cognitive defect Bruising over left eye Lungs decreased BS right base MR apical murmur Abdomen benign Trace edema   Lab Results: Basic Metabolic Panel: Recent Labs    10/03/23 0709 10/04/23 0513  NA 139 138  K 5.3* 5.3*  CL 110 110  CO2 19* 19*  GLUCOSE 88 105*  BUN 98* 98*  CREATININE 3.71* 3.51*  CALCIUM  7.6* 7.3*    CBC: Recent Labs    10/04/23 0513 10/04/23 1003 10/05/23 0549  WBC 3.6*  --  3.8*  NEUTROABS 2.6  --  2.0  HGB 7.8* 8.5* 7.5*  HCT 25.1* 27.9* 23.8*  MCV 90.0  --  90.2  PLT 217  --  194    D-Dimer: Recent Labs    10/03/23 1320  DDIMER 1.75*    Imaging: DG CHEST PORT 1 VIEW Result Date: 10/03/2023 CLINICAL DATA:  142230 Pleural effusion 142230 EXAM: PORTABLE CHEST - 1 VIEW COMPARISON:  October 02, 2023 FINDINGS: Small right pleural effusion with right basilar atelectasis. Moderate cardiomegaly. Tortuous aorta with aortic atherosclerosis. No acute fracture or destructive lesions. Multilevel thoracic osteophytosis. Right glenohumeral joint arthroplasty. IMPRESSION: 1. Worsening, small right pleural effusion with right basilar atelectasis. 2. The nodular opacity in the right midlung on the prior radiograph is not well visualized. Electronically Signed   By: Rance Burrows M.D.   On: 10/03/2023 14:39    Cardiac Studies:  ECG: Likely  flutter 118 10/03/23 pending this am    Telemetry: not on   Echo: 07/28/23 stable EF 50-55% mild/mod MR   Medications:    sodium chloride    Intravenous Once   amiodarone   200 mg Oral Daily   dexamethasone   10 mg Oral Daily   DULoxetine   20 mg Oral QHS   feeding supplement (NEPRO CARB STEADY)  237 mL Oral TID WC   levothyroxine   100 mcg Oral QAC breakfast   lidocaine   1 patch Transdermal Q24H   metoprolol  tartrate  25 mg Oral BID   multivitamin with minerals  1 tablet Oral Daily   pantoprazole   40 mg Oral Daily   polyethylene glycol  17 g Oral BID   senna-docusate  2 tablet Oral BID   simvastatin   40 mg Oral QHS   sodium bicarbonate   1,300 mg Oral TID      Assessment/Plan:   PAF: has had two prior [facility]. Not on anticoagulation due to recent [facility]. Increase lopressor  for rate control and increase amiodarone  to 200 mg bid. At some point he would be a good Watchman candidate ECG this am confirms rate controlled afib with no acute ST changes  He does not need telemetry and ok to do PT/OT  Pleural effusion: consider repeat IR thoracentesis  prior one exudative EF 50-55% doubt CHF being unilateral and recurrent Given renal failure mechanical drainage favored over iv diuretic D dimer elevated doubt PE ? V/Q per primary service  CRF:  concerning given age and co morbidities not clear that he would be a dialysis candidate Apparently was to have renal biopsy before all this trauma Cr 3.51 this am K 5.3  MDS:  Hct dropping 23.8 WBC low 3.8 PLT stable 194  FOB negative yesterday may need transfusion per primary service     Janelle Mediate 10/05/2023, 9:16 AM

## 2023-10-06 ENCOUNTER — Inpatient Hospital Stay (HOSPITAL_COMMUNITY)

## 2023-10-06 ENCOUNTER — Ambulatory Visit (HOSPITAL_COMMUNITY): Attending: Physical Medicine and Rehabilitation

## 2023-10-06 DIAGNOSIS — R609 Edema, unspecified: Secondary | ICD-10-CM | POA: Diagnosis not present

## 2023-10-06 DIAGNOSIS — I4819 Other persistent atrial fibrillation: Secondary | ICD-10-CM

## 2023-10-06 DIAGNOSIS — R600 Localized edema: Secondary | ICD-10-CM | POA: Insufficient documentation

## 2023-10-06 LAB — CBC WITH DIFFERENTIAL/PLATELET
Abs Immature Granulocytes: 1.41 10*3/uL — ABNORMAL HIGH (ref 0.00–0.07)
Basophils Absolute: 0.1 10*3/uL (ref 0.0–0.1)
Basophils Relative: 1 %
Eosinophils Absolute: 0 10*3/uL (ref 0.0–0.5)
Eosinophils Relative: 0 %
HCT: 25.5 % — ABNORMAL LOW (ref 39.0–52.0)
Hemoglobin: 7.9 g/dL — ABNORMAL LOW (ref 13.0–17.0)
Immature Granulocytes: 23 %
Lymphocytes Relative: 15 %
Lymphs Abs: 0.9 10*3/uL (ref 0.7–4.0)
MCH: 28.4 pg (ref 26.0–34.0)
MCHC: 31 g/dL (ref 30.0–36.0)
MCV: 91.7 fL (ref 80.0–100.0)
Monocytes Absolute: 0.5 10*3/uL (ref 0.1–1.0)
Monocytes Relative: 8 %
Neutro Abs: 3.2 10*3/uL (ref 1.7–7.7)
Neutrophils Relative %: 53 %
Platelets: 202 10*3/uL (ref 150–400)
RBC: 2.78 MIL/uL — ABNORMAL LOW (ref 4.22–5.81)
RDW: 18.4 % — ABNORMAL HIGH (ref 11.5–15.5)
Smear Review: NORMAL
WBC: 6.1 10*3/uL (ref 4.0–10.5)
nRBC: 0.3 % — ABNORMAL HIGH (ref 0.0–0.2)

## 2023-10-06 LAB — BASIC METABOLIC PANEL WITH GFR
Anion gap: 6 (ref 5–15)
BUN: 90 mg/dL — ABNORMAL HIGH (ref 8–23)
CO2: 22 mmol/L (ref 22–32)
Calcium: 7.2 mg/dL — ABNORMAL LOW (ref 8.9–10.3)
Chloride: 112 mmol/L — ABNORMAL HIGH (ref 98–111)
Creatinine, Ser: 3.25 mg/dL — ABNORMAL HIGH (ref 0.61–1.24)
GFR, Estimated: 18 mL/min — ABNORMAL LOW (ref >60)
Glucose, Bld: 102 mg/dL — ABNORMAL HIGH (ref 70–99)
Potassium: 5.6 mmol/L — ABNORMAL HIGH (ref 3.5–5.1)
Sodium: 140 mmol/L (ref 135–145)

## 2023-10-06 MED ORDER — SODIUM ZIRCONIUM CYCLOSILICATE 10 G PO PACK
10.0000 g | PACK | Freq: Two times a day (BID) | ORAL | Status: AC
  Administered 2023-10-06 (×2): 10 g via ORAL
  Filled 2023-10-06 (×2): qty 1

## 2023-10-06 MED ORDER — HYDRALAZINE HCL 10 MG PO TABS
10.0000 mg | ORAL_TABLET | Freq: Four times a day (QID) | ORAL | Status: DC | PRN
Start: 2023-10-06 — End: 2023-10-10
  Filled 2023-10-06: qty 1

## 2023-10-06 MED ORDER — LOPERAMIDE HCL 2 MG PO CAPS
2.0000 mg | ORAL_CAPSULE | ORAL | Status: DC | PRN
  Administered 2023-10-06 – 2023-10-09 (×4): 2 mg via ORAL
  Filled 2023-10-06 (×4): qty 1

## 2023-10-06 MED ORDER — DEXAMETHASONE 0.5 MG PO TABS: 1.0000 mg | ORAL_TABLET | Freq: Every day | ORAL | Status: DC

## 2023-10-06 MED ORDER — DEXAMETHASONE 4 MG PO TABS
5.0000 mg | ORAL_TABLET | Freq: Every day | ORAL | Status: AC
  Administered 2023-10-07 – 2023-10-08 (×2): 5 mg via ORAL
  Filled 2023-10-06 (×2): qty 2

## 2023-10-06 MED ORDER — DEXAMETHASONE 0.5 MG PO TABS
2.0000 mg | ORAL_TABLET | Freq: Every day | ORAL | Status: AC
  Administered 2023-10-09 – 2023-10-10 (×2): 2 mg via ORAL
  Filled 2023-10-06 (×2): qty 4

## 2023-10-06 NOTE — Progress Notes (Signed)
 BLE venous duplex has been completed.    Results can be found under chart review under CV PROC. 10/06/2023 6:07 PM Raymir Frommelt RVT, RDMS

## 2023-10-06 NOTE — Plan of Care (Signed)
?  Problem: RH Problem Solving Goal: LTG Patient will demonstrate problem solving for (SLP) Description: LTG:  Patient will demonstrate problem solving for basic/complex daily situations with cues  (SLP) Outcome: Completed/Met   Problem: RH Memory Goal: LTG Patient will use memory compensatory aids to (SLP) Description: LTG:  Patient will use memory compensatory aids to recall biographical/new, daily complex information with cues (SLP) Outcome: Completed/Met   Problem: RH Awareness Goal: LTG: Patient will demonstrate awareness during functional activites type of (SLP) Description: LTG: Patient will demonstrate awareness during functional activites type of (SLP) Outcome: Completed/Met   

## 2023-10-06 NOTE — Progress Notes (Signed)
    Subjective:  Denies SSCP, palpitations or Dyspnea Cognitive defects afib rates ok at rest  Objective:  Vitals:   10/05/23 1600 10/05/23 1937 10/05/23 2332 10/06/23 0418  BP: (!) 151/92 (!) 138/90 (!) 143/103 (!) 158/102  Pulse: (!) 109 (!) 110 (!) 108 (!) 105  Resp: 18 18 18 18   Temp: 98.2 F (36.8 C) 98 F (36.7 C) 98.3 F (36.8 C) 98 F (36.7 C)  TempSrc: Oral Oral Oral Oral  SpO2: 97% 96% 97% 98%  Weight:      Height:        Intake/Output from previous day:  Intake/Output Summary (Last 24 hours) at 10/06/2023 0905 Last data filed at 10/06/2023 5621 Gross per 24 hour  Intake 720 ml  Output 1500 ml  Net -780 ml    Physical Exam: Mild cognitive defect Bruising over left eye Lungs decreased BS right base MR apical murmur Abdomen benign Trace edema   Lab Results: Basic Metabolic Panel: Recent Labs    10/05/23 0549 10/06/23 0653  NA 139 140  K 5.1 5.6*  CL 109 112*  CO2 20* 22  GLUCOSE 92 102*  BUN 93* 90*  CREATININE 3.28* 3.25*  CALCIUM  7.2* 7.2*    CBC: Recent Labs    10/05/23 0549 10/06/23 0653  WBC 3.8* 6.1  NEUTROABS 2.0 PENDING  HGB 7.5* 7.9*  HCT 23.8* 25.5*  MCV 90.2 91.7  PLT 194 202    D-Dimer: Recent Labs    10/03/23 1320  DDIMER 1.75*    Imaging: No results found.   Cardiac Studies:  ECG: Likely flutter 118 10/03/23 pending this am    Telemetry: not on   Echo: 07/28/23 stable EF 50-55% mild/mod MR   Medications:    sodium chloride    Intravenous Once   amiodarone   200 mg Oral Daily   dexamethasone   10 mg Oral Daily   DULoxetine   20 mg Oral QHS   feeding supplement (NEPRO CARB STEADY)  237 mL Oral TID WC   levothyroxine   100 mcg Oral QAC breakfast   lidocaine   1 patch Transdermal Q24H   metoprolol  tartrate  25 mg Oral BID   multivitamin with minerals  1 tablet Oral Daily   pantoprazole   40 mg Oral Daily   polyethylene glycol  17 g Oral BID   senna-docusate  2 tablet Oral BID   simvastatin   40 mg Oral QHS    sodium bicarbonate   1,300 mg Oral TID      Assessment/Plan:   PAF: has had two prior [facility]. Not on anticoagulation due to recent [facility] Lopressor  and amiodarone  doses increased At some point he would be a good Watchman candidate Pleural effusion: post IR thoracentesis CXR this am with small residual would not appear to be needing tap CRF:  concerning given age and co morbidities not clear that he would be a dialysis candidate Cr 3.25 K 5.6 tosay MDS:  Hct 25.5   FOB negative yesterday may need transfusion per primary service prior to d/c     [facility] 10/06/2023, 9:05 AM

## 2023-10-06 NOTE — Progress Notes (Signed)
 dPhysical Therapy Session Note  Patient Details  Name: [name] MRN: [id number] Date of Birth: 20-May-1941  Today's Date: 10/06/2023 PT Individual Time: 4782-9562 PT Individual Time Calculation (min): 57 min   Today's Date: 10/06/2023 PT Individual Time: 1401-1416 PT Individual Time Calculation (min): 15 min  and Today's Date: 10/06/2023 PT Missed Time: 45 Minutes Missed Time Reason: Patient fatigue;Patient ill (Comment) (feels lightheaded)  Short Term Goals: Week 1:  PT Short Term Goal 1 (Week 1): pt will perform all transfers with LRAD and CGA PT Short Term Goal 2 (Week 1): pt will ambulate 116ft with LRAD and CGA PT Short Term Goal 3 (Week 1): pt will navigate 4 6in steps with 1 handrail and CGA  Skilled Therapeutic Interventions/Progress Updates:     1st Session: Pt received supine in bed and agrees to therapy. No complaint of pain. Supine to sit with bed features, pt attempts to stand to pull up shorts but has significant posterior bias without use of AD. PT provides pt with RW and pt able to maintain standing with CGA as PT assists to button shorts. Pt then ambulates to urinate in toilet and while he is standing and urinating, verbalizes that he has had "an accident". Pt noted to have had bowel movement so PT cues pt for sequencing of turn to sit on toilet with use of safety bars and PT providing CGA. Pt sits on toilet and PT removes soiled brief. Following toileting, pt ambulates to sink to wash hands and then ambulates x125' to gym with RW and cues for upright posture to improve balance. Pt takes seated rest break. Pt then completes Cornhole activity to challenge dynamic standing balance, coordination, and activity tolerance. Pt performs multiple rounds of cornhole, tasked with grabbing bag with LUE, passing to RUE, then tossing bag without UE support. Pt requires several seated rest breaks. Pt ambulates back to room and left seated on toilet due to having loose bowels. RN aware and call  bell in reach.   2nd Session: Pt received supine in bed with family present for family education. Pt politely declines OOB mobility at this time due to feelings light headed and awaiting blood transfusion. PT provides education to family regarding pt's progress with mobility at CIR as well as recommendations for safe mobility following discharge. Family provided with strategies to assist pt in maintaining strength and endurance and prevent functional decline. Pt left with alarm intact and all needs within reach. Pt misses 45 minutes of skilled PT due to fatigue and light headedness.   Therapy Documentation Precautions:  Precautions Precautions: Fall Required Braces or Orthoses: Sling Restrictions Weight Bearing Restrictions Per Provider Order: Yes LUE Weight Bearing Per Provider Order: Non weight bearing Other Position/Activity Restrictions: Lt shoulder NWB, gentle ROM, sling for comfort. C2 ALL injury    Therapy/Group: Individual Therapy  Neva Barban, PT, DPT 10/06/2023, 4:54 PM

## 2023-10-06 NOTE — Progress Notes (Signed)
 Inpatient Rehabilitation Care Coordinator Assessment and Plan Patient Details  Name: [name] MRN: [id number] Date of Birth: 1940/07/04  Today's Date: 10/06/2023  Hospital Problems: Principal Problem:   Traumatic subarachnoid hemorrhage [facility]) Active Problems:   Anticoagulation management encounter   Malnutrition of moderate degree   PAF (paroxysmal atrial fibrillation) (HCC)   Persistent atrial fibrillation (HCC)  Past Medical History:  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 OR;  Service: Ophthalmology;  Laterality: Right;  C3F8   IR BONE MARROW BIOPSY & ASPIRATION  08/03/2023   IR THORACENTESIS ASP PLEURAL SPACE W/IMG GUIDE  09/22/2023   IR THORACENTESIS ASP PLEURAL SPACE W/IMG GUIDE  10/02/2023   LEFT HEART CATH AND CORONARY ANGIOGRAPHY N/A 05/02/2023   Procedure: LEFT HEART CATH AND CORONARY ANGIOGRAPHY;  Surgeon: Sammy Crisp, MD;  Location: [facility] INVASIVE CV LAB;  Service: Cardiovascular;  Laterality: N/A;   REVERSE SHOULDER ARTHROPLASTY Right 04/13/2023    Procedure: REVERSE SHOULDER ARTHROPLASTY;  Surgeon: Elner Hahn, MD;  Location: [facility] ORS;  Service: Orthopedics;  Laterality: Right;   SCLERAL BUCKLE  08/11/2011   Procedure: SCLERAL BUCKLE;  Surgeon: Rexene Catching, MD;  Location: [facility];  Service: Ophthalmology;  Laterality: Right;   VARICOSE VEIN SURGERY     Social History:  reports that he quit smoking about 13 years ago. His smoking use included cigarettes. He has never used smokeless tobacco. He reports that he does not drink alcohol and does not use drugs.  Family / Support Systems Marital Status: Married How Long?: 63 years Patient Roles: Spouse, Parent Spouse/Significant Other: [name] (wife) Children: 2 sons- adult son in the [place], and another lives in [place] Other Supports: none reported Anticipated Caregiver: wife Ability/Limitations of Caregiver: wife to be primary Holiday representative, and son to help with physical support Caregiver Availability: 24/7 Family Dynamics: Pt lives with wife and adult son  Social History Preferred language: English Religion: None Cultural Background: Pt has been working as a Product/process development scientist, 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 - How often do you need to have someone help you when you read instructions, pamphlets, or other written material from your doctor or pharmacy?: Never Writes: Yes Employment Status: Employed Name of Employer: self-employed Return to Work Plans: TBD Marine scientist Issues: Denies Guardian/Conservator: Product manager- wife   Abuse/Neglect Abuse/Neglect Assessment Can Be Completed: Yes Physical Abuse: Denies Verbal Abuse:  Denies Sexual Abuse: Denies Exploitation of patient/patient's resources: Denies Self-Neglect: Denies  Patient response to: Social Isolation - How often do you feel lonely or isolated from those around you?: Never  Emotional Status Pt's affect, behavior and adjustment status: Pt in good spirits at time of visit Recent  Psychosocial Issues: Denies Psychiatric History: Denies Substance Abuse History: Denies  Patient / Family Perceptions, Expectations & Goals Premorbid pt/family roles/activities: Independent Anticipated changes in roles/activities/participation: Assistance with ADLs/IADLs  Community Resources Is the patient able to respond to transportation needs?: Yes In the past 12 months, has lack of transportation kept you from medical appointments or from getting medications?: No In the past 12 months, has lack of transportation kept you from meetings, work, or from getting things needed for daily living?: No Resource referrals recommended: Neuropsychology  Discharge Planning Living Arrangements: Spouse/significant other, Children Support Systems: Spouse/significant other, Children Type of Residence: Private residence Insurance Resources: Electrical engineer Resources: Restaurant manager, fast food Screen Referred: No Living Expenses: Banker Management: Patient, Spouse Does the patient have any problems obtaining your medications?: No Home Management: PT son manages all home care needs including meals Patient/Family Preliminary Plans: TBD Care Coordinator Barriers to Discharge: Decreased caregiver support, Lack of/limited family support Care Coordinator Anticipated Follow Up Needs: HH/OP Expected length of stay: D/c 5/6  Clinical Impression Pt is not a veteran. HCPOA- wife. DME, RW, canes, and w/c.  Layanna Charo A Allysa Governale 10/06/2023, 3:59 PM

## 2023-10-06 NOTE — Progress Notes (Signed)
 PROGRESS NOTE   Subjective/Complaints: No complaints overnight.  Poor sleep per log, had a lot of diarrhea overnight.  Patient has had intermittent issues with this prior to admission with sesame seeds and food, but has also been getting several doses of Lokelma .  Patient had several questions about specific foods he can/cannot eat with renal diet; stated that his PCP had discussed this with him once, but not comprehensively.  BP elevated overnight; added PRN hysralazine HR remains elevated but better rate controlled  ROS: Positives per HPI above. Denies fevers, chills, N/V, abdominal pain, chest pain, new weakness or paraesthesias.   Positive for tachycardia--improving Diarrhea-new  Objective:   No results found.  Recent Labs    10/05/23 0549 10/06/23 0653  WBC 3.8* 6.1  HGB 7.5* 7.9*  HCT 23.8* 25.5*  PLT 194 202   Recent Labs    10/05/23 0549 10/06/23 0653  NA 139 140  K 5.1 5.6*  CL 109 112*  CO2 20* 22  GLUCOSE 92 102*  BUN 93* 90*  CREATININE 3.28* 3.25*  CALCIUM  7.2* 7.2*    Intake/Output Summary (Last 24 hours) at 10/06/2023 1010 Last data filed at 10/06/2023 0616 Gross per 24 hour  Intake 720 ml  Output 1500 ml  Net -780 ml         Physical Exam: Vital Signs Blood pressure (!) 158/102, pulse (!) 105, temperature 98 F (36.7 C), temperature source Oral, resp. rate 18, height 5\' 11"  (1.803 m), weight 75.3 kg, SpO2 98%.  General: No acute distress.  Laying in bed.  Mood and affect are appropriate HEENT: + HOH Heart: Irregular rhythm, regular rate at rest.  Systolic murmur present. Lungs: Mild R  basilar crackles,  otherwise clear to auscultation bilaterally. No respiratory distress.  On room air. Abdomen: Positive bowel sounds, soft nontender to palpation, nondistended.  Extremities: No clubbing, cyanosis; trace bilateral edema. Skin: No evidence of breakdown, no evidence of rash.  Facial scabs  stable, healing. Neurologic:  Awake, alert, and oriented x 4. 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 Cranial nerves II through XII intact, motor strength is 4-5-/5 throughout, no apparent sensory deficits. Musculoskeletal: Full range of motion in all 4 extremities. No joint swelling. No calf tenderness.     Assessment/Plan: 1. Functional deficits which require 3+ hours per day of interdisciplinary therapy in a comprehensive inpatient rehab setting. Physiatrist is providing close team supervision and 24 hour management of active medical problems listed below. Physiatrist and rehab team continue to assess barriers to discharge/monitor patient progress toward functional and medical goals  Care Tool:  Bathing    Body parts bathed by patient: Right arm, Left arm, Chest, Abdomen, Right upper leg, Left upper leg, Right lower leg, Left lower leg, Face   Body parts bathed by helper: Front perineal area, Buttocks     Bathing assist Assist Level: Minimal Assistance - Patient > 75%     Upper Body Dressing/Undressing Upper body dressing   What is the patient wearing?: Pull over shirt    Upper body assist Assist Level: Set up assist    Lower Body Dressing/Undressing Lower body dressing      What is the  patient wearing?: Underwear/pull up, Pants     Lower body assist Assist for lower body dressing: Contact Guard/Touching assist     Toileting Toileting    Toileting assist Assist for toileting: Moderate Assistance - Patient 50 - 74%     Transfers Chair/bed transfer  Transfers assist     Chair/bed transfer assist level: Contact Guard/Touching assist     Locomotion Ambulation   Ambulation assist      Assist level: Contact Guard/Touching assist Assistive device: Walker-rolling Max distance: 190'   Walk 10 feet activity   Assist     Assist level: Contact Guard/Touching assist Assistive device: Walker-rolling   Walk 50 feet activity   Assist     Assist level: Contact Guard/Touching assist Assistive device: Walker-rolling    Walk 150 feet activity   Assist Walk 150 feet activity did not occur: Safety/medical concerns (fatigue)  Assist level: Contact Guard/Touching assist Assistive device: Walker-rolling    Walk 10 feet on uneven surface  activity   Assist     Assist level: Minimal Assistance - Patient > 75% Assistive device: Other (comment), Cane-quad (L handrail)   Wheelchair     Assist Is the patient using a wheelchair?: Yes Type of Wheelchair: Manual    Wheelchair assist level: Dependent - Patient 0% Max wheelchair distance: 170ft    Wheelchair 50 feet with 2 turns activity    Assist        Assist Level: Dependent - Patient 0%   Wheelchair 150 feet activity     Assist      Assist Level: Dependent - Patient 0%   Blood pressure (!) 158/102, pulse (!) 105, temperature 98 F (36.7 C), temperature source Oral, resp. rate 18, height 5\' 11"  (1.803 m), weight 75.3 kg, SpO2 98%.  Medical Problem List and Plan: 1. Functional deficits secondary to traumatic SAH fall             -patient may  shower             -ELOS/Goals: PT/OT/SLP Supervision, 10-12 days - 10/10/23 DC             ` Stable to continue CIR  4/19: Poor adherence to LUE NWB, otherwise SPV OT. Low-120s HR with standing and walking. Some cog deficits c/b personality.    Coordination for week of 5/5: Will need blood transfusion just prior to discharge per [name].  If right pleural effusion remains stable, set up outpatient pulmonology referral on DC.  If effusion re-accumulates, get pulmonology consult while inpatient and order flow cytometry for tap.  2.  Antithrombotics: -DVT/anticoagulation:  Mechanical: Antiembolism stockings, thigh (TED hose) Bilateral lower extremities.  Chronic Eliquis  currently on hold due to [facility]             -antiplatelet therapy: N/A  -4-29: With ongoing tachycardia and shortness of breath off of AC,  need to rule out PE.  Discussed with cardiology and nephrology, will get D-dimer --> nuc med scan if positive.  Will also order bilateral lower extremity duplex  4/30; respiratory symptoms resolved, tachycardia improved with treatment of a flutter.  D-dimer was positive, however explained by comorbidities and with low suspicion for PE at this point; DC nuc med scan.  Will still get duplex lower extremities to assess given poor mobility off of AC, prior to placing SCDs for better prevention.--Pending 5/2  3. Pain Management: LidoDerm  patch, Robaxin  as needed, tramadol  as needed 4. Mood/Behavior/Sleep: Provide emotional support             -  antipsychotic agents: Seroquel  25 mg nightly 5. Neuropsych/cognition: This patient is capable of making decisions on his own behalf. 6. Skin/Wound Care: Routine skin checks 7. Fluids/Electrolytes/Nutrition: Routine ins and outs with follow-up chemistries  - 4-28: Very poor p.o. intakes over the weekend, 0% of meals and only 240 cc of p.o. intakes yesterday.  Will get nutrition consult to help evaluate today.  4-30: Eating a little better, at least 1 meal 100 %/day.  5-2: Nephrology adjusted to renal diet; patient endorsed this has been discussed with him in the past but he does not know what constitutes a renal diet.  Nutritional consult to assist with education today.  8.  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 Neurosurgery follow-up no current plan for surgical intervention however patient is currently maintained on Decadron  therapy for pain control.  - 5/2: Wean off Decadron  due to recurrent hyperkalemia, hypertension.  Given that patient has been on this medication more than 2 weeks, we will start wean over the next 6 days:   - 5 mg daily x2 days   - 2 mg daily x2 days   - 1 mg dailg x2 days   -  stop  9.  Recurrent large right pleural effusion.  Status post ultrasound thoracentesis 1.6 L fluid removed.  - 4-28: Chest x-ray today with recurrence, IR tapped for 825 cc of dark amber fluid.  Follow-up chest x-ray improved.   4/29: CXR for ongoing crackles and opacity yesterday - showing recurrent pleural effusion, opacity from yesterday not well-visualized.  Per cardiology, effusion unlikely due to CHF and would avoid IV diuretics due to renal disease; will need to drain again with recurrence.   5-1: Per [name], request pulmonary consult/referral prior to discharge and, if effusion is retapped, flow cytometry testing.  Will get repeat chest x-ray in a.m. for monitoring, otherwise seems stable from a respiratory standpoint at this point so no emergent tap.  5/2: Chest x-ray this morning with stable, small right pleural effusion and right basilar atelectasis.  Add incentive spirometry.  Respiratory status stable, no indication for tap at this time.    10.  PAF.  Rate controlled.  Continue amiodarone .  Eliquis  on hold due to [facility].  - 4-28: rergular tachycardia today, c/b anemia and pleural effusion.  Treat causative sources first. - 4.29: remains tachycardic,  regular rhythm. EKG showing atrial flutter.  Added metoprolol  12.5 twice daily, cardiology consulted, appreciate recommendations. 4-30: Rate improved, patient feeling much better.  Cardiology increased metoprolol  to 25 mg twice daily.  Also increased amiodarone .  Appreciate their insight and recommendations. 5-1, 5/2: Heart rate looking better.  Appreciate cardiology assistance.  11.  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  Follow-up nephrology services.  Renal ultrasound no hydronephrosis.  Currently no plans for HD. Avoid nephrotoxins. Monitor labs. Maintain MAP > 65  - 4-28: Creatinine down to 3.6 from prior 4.5; BUN is increasing.  Hyperkalemic status post transfusion today.  Has been on continuous IVF--Dced today   - 4/29: Per nephro, if  contrast needed, avoid ACE/ARB  12.  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  Follow-up orthopedic services Dr. Abigail Abler.    13.  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  Followed by oncology services [name].  Prior to admission patient was receiving Revlimid  5 mg mouth daily for 21 days then hold for 7 days repeating every  28 days  4/28: Hemoglobin steadily downtrending to 7.1 today; given persistent tachycardia and symptoms, will transfuse 1 unit CMV negative today and follow with 40 mg IV Lasix .  4/29: HgB improved 8.9; monitor  4/30: Down to 7.8 again, WBC down to 3.6. H/H, FOBT ordered. --H&H 8.5, unchanged  5/1: HgB 7.5, WBC stable 3.8. FOBT negative.  Spoke with oncology on-call and [name].  Per [name], no need for daily monitoring, expect patient to continue to drop his hemoglobin, would transfuse once just prior to discharge and follow-up with her shortly after; already scheduled.   14.  Hypothyroidism.  Synthroid  15.  Hyperlipidemia.  Zocor  16.  History of BPH.  Check PVR--no retention per documentation. 17.  GERD.  Protonix  18.  Hypotension/hypertension.  Followed by renal services with plan to slowly wean ProAmatine . Vitals:   10/05/23 2332 10/06/23 0418  BP: (!) 143/103 (!) 158/102  Pulse: (!) 108 (!) 105  Resp: 18 18  Temp: 98.3 F (36.8 C) 98 F (36.7 C)  SpO2: 97% 98%   Supine systolic BPs mildly elevated will order ortho vitals to see if midodrine  can be reduced  -4/28: Severe hypertension overnight.  Reduce midodrine  to 5 mg twice daily at 8 AM and noon, get orthostatic vitals today. Encourage PO fluids--remained hypertensive today, decrease midodrine  to 2.5 mg twice daily tomorrow a.m.  - 4/29: Remains elevated, DC midodrine .  - 4-30: Normotensive off of midodrine , monitor 5/2:  elevated, will start weaning dexamethasone  as above.  Add hydralazine  10 mg as needed for systolic greater than 170, diastolic greater than  110.  19. Occasional cough, reports started couple hours ago             -Start robitussin              -Repeat Xray chest if does not improve  - 4-28: Chest x-ray today with pleural effusion, tapped as above.  Remaining right middle lobe opacity.  No fevers, WBC stable.  Monitor respiratory status following effusion tap.   - 4.29: see above; remains afebrile, WBC stable - monitor  5-2: Incentive spirometry as above  20. Leukopenia- resolved             -Oncology s/p  GCSF, CBC daily    Latest Ref Rng & Units 10/06/2023    6:53 AM 10/05/2023    5:49 AM 10/04/2023   10:03 AM  CBC  WBC 4.0 - 10.5 K/uL 6.1  3.8    Hemoglobin 13.0 - 17.0 g/dL 7.9  7.5  8.5   Hematocrit 39.0 - 52.0 % 25.5  23.8  27.9   Platelets 150 - 400 K/uL 202  194      - 4-30: Recurrent.  Continue daily CBC.--stable  5-2: Per [name], no need for daily monitoring, will monitor q. Monday/Thursday  21. L knee OA             -Xray nov 2024 indicates moderate OA             -Muscle rub prn     22.  Left AC joint disruption - Nonweightbearing left upper extremity with shoulder sling for comfort.    - 4/29: Per ortho OK for WBAT; order changed  23.  Hyperkalemia.  5.8.  Likely secondary to blood transfusion today, already got 40 mg of IV Lasix  for treatment.  Repeat BMP in a.m., continue IV fluids.  - Ensure changed to Nepro  - 4/29: Cr stable, K improved 5.3. Will give  Lokelma  to avoid diuretics. Repeat in AM. EKG w/o T wave changes  4-30: K remains 5.3, no bowel movement yesterday.  Will give another dose Lokelma  5 mg x 1, and increase bowel regimen for effect.  5/1: BM yesterday, BMP pending--resolved, 5.1.  5/2: Multiple bowel movements overnight, potassium up to 5.6.  DC Decadron  as above.  Nephrology to evaluate today, appreciate their recommendations and assistance.--Ordered Lokelma  10 mg  24. Diarrhea, acute on chronic.   5-2: Up last night with diarrhea, likely secondary to Lokelma  and increased bowel regimen.   DC MiraLAX , added as needed Imodium .  7 days A FACE TO FACE EVALUATION WAS PERFORMED  Bea Lime 10/06/2023, 10:10 AM

## 2023-10-06 NOTE — Progress Notes (Signed)
 Speech Language Pathology Discharge Summary  Patient Details  Name: [name] MRN: [id number] Date of Birth: 08-02-1940  Date of Discharge from SLP service:Oct 06, 2023  Today's Date: 10/06/2023 SLP Individual Time: 1500-1530 SLP Individual Time Calculation (min): 30 min   Skilled Therapeutic Interventions: Pt and wife greeted at bedside. SLP provided education re return to cognitive baseline and general memory strategies to continue in home environment. Pt continues to present w/ reduced mental flexibility in regards to return to PLOF, however, wife confirmed deficits d/t personality vs true cognitive impairment. He then completed an abstract reasoning task re similar words @ modI. At the end of tx tasks, he was left in bed with the alarm set. See d/c summary below.     Patient has met 3 of 3 long term goals.  Patient to discharge at overall Modified Independent level.  Reasons goals not met: n/a   Clinical Impression/Discharge Summary:  Pt has made good progress this stay, as evidenced by improved awareness, problem solving, and memory. He completes complex cognitive tasks at modI and demonstrates return to cognitive baseline. Pt/family education complete and skilled dysphagia tx no longer warranted.    Care Partner:  Caregiver Able to Provide Assistance: Yes  Type of Caregiver Assistance: Physical  Recommendation:  None      Equipment: n/a   Reasons for discharge: Treatment goals met   Patient/Family Agrees with Progress Made and Goals Achieved: Yes    Rozell Cornet 10/06/2023, 4:42 PM

## 2023-10-06 NOTE — Progress Notes (Addendum)
 Nutrition Follow-up  DOCUMENTATION CODES:   Non-severe (moderate) malnutrition in context of acute illness/injury  INTERVENTION:   Provided education on lower potassium fruit and vegetable options. Continue Nepro Shake po TID, each supplement provides 425 kcal and 19 grams protein. Continue Magic cup TID with meals, each supplement provides 290 kcal and 9 grams of protein. Continue MVI with minerals daily.  NUTRITION DIAGNOSIS:   Moderate Malnutrition related to acute illness (traumatic SAH) as evidenced by mild fat depletion, mild muscle depletion.  Ongoing   GOAL:   Patient will meet greater than or equal to 90% of their needs  Progressing   MONITOR:   PO intake, Supplement acceptance  REASON FOR ASSESSMENT:   Consult Assessment of nutrition requirement/status  ASSESSMENT:   83 yo male admitted with functional deficit d/t traumatic SAH, L AC joint disruption s/p fall on admission. PMH includes A fib, HTN, HLD, hypothyroidism, anemia, R pleural effusion, CKD stage IV, CVA.  Diet was changed to renal with 1800 ml fluid restriction this morning by Nephrology d/t elevated potassium. PO supplement was also changed to Nepro shake. Meal intake has been variable, 0-100% per nursing documentation. Received consult for renal diet education. Provided patient with lower and higher potassium fruits and vegetables handout. Family at bedside very appreciative of information provided so they can continue to help patient order his meals.   Labs reviewed. K 5.6, phos 5.4 CBG: 105-181 yesterday  Medications reviewed and include MVI with minerals, senokot-s, lokelma .  Admit weight 73 kg (4/25) Current weight 75.3 kg (5/1)  Diet Order:   Diet Order             Diet renal with fluid restriction Fluid restriction: 1800 mL Fluid; Room service appropriate? Yes; Fluid consistency: Thin  Diet effective now                   EDUCATION NEEDS:   Not appropriate for education at  this time  Skin:  Skin Assessment: Reviewed RN Assessment (abrasion to L forehead)  Last BM:  5/2 type 7  Height:   Ht Readings from Last 1 Encounters:  10/02/23 5\' 11"  (1.803 m)    Weight:   Wt Readings from Last 1 Encounters:  10/05/23 75.3 kg    Ideal Body Weight:  78.2 kg  BMI:  Body mass index is 23.15 kg/m.  Estimated Nutritional Needs:   Kcal:  1900-2100  Protein:  100-125 gm  Fluid:  1.9-2.1 L   Barnet Boots RD, LDN, CNSC Contact via secure chat. If unavailable, use group chat "RD Inpatient."

## 2023-10-06 NOTE — Progress Notes (Signed)
 Occupational Therapy Weekly Progress Note  Patient Details  Name: [name] MRN: [id number] Date of Birth: December 31, 1940  Beginning of progress report period: September 30, 2023 End of progress report period: Oct 06, 2023  Today's Date: 10/06/2023 OT Individual Time: 1303-1400 OT Individual Time Calculation (min): 57 min   [name] has made good progress toward his OT goals. He is at a CGA level overall now and is anticipated to meet his (S) level goals by d/c. Family education completed today.  Patient continues to demonstrate the following deficits: muscle weakness, decreased cardiorespiratoy endurance, decreased safety awareness and decreased memory, and decreased standing balance, decreased postural control, decreased balance strategies, and difficulty maintaining precautions and therefore will continue to benefit from skilled OT intervention to enhance overall performance with BADL.  Patient progressing toward long term goals..  Continue plan of care.  OT Short Term Goals Week 1:  OT Short Term Goal 1 (Week 1): STG= LTG d/t ELOS OT Short Term Goal 1 - Progress (Week 1): Progressing toward goal Week 2:  OT Short Term Goal 1 (Week 2): STG=LTG d/t ELOS  Skilled Therapeutic Interventions/Progress Updates:    Pt supine upon arrival with no c/o pain. Family education session completed with pt and his wife/son. Verbal education provided re fall risk reduction, energy conservation strategies, home carryover of transfer training, ADLs, and IADLs. Provided education and demonstration on DME use recommendations at home. He completed bed mobility to the EOB with (S). He completed 125 ft of functional mobility to the therapy gym with CGA using the RW. Pt completed blocked practice sit <> stand, 1x5 repetitions with no UE support, to challenge generalized strengthening for ADL transfers, as well as increasing functional activity tolerance and cardiorespiratory endurance. Pt required CGA. He then returned to  the room, another 125 ft of functional mobility with CGA using the RW, to complete toileting tasks. He voided urine and BM, completing 3/3 toileting tasks with CGA, requiring cueing to initiate, as he waited for OT to complete. He then completed 125 ft of functional mobility back to the therapy gym. He completed 2x5 repetitions of previous sit <> stand task. He required rest breaks intermittently. He returned to his room following. He requested to lay down and said he now felt dizzy d/t HgB being low. He was left supine with all needs met, family present.    Therapy Documentation Precautions:  Precautions Precautions: Fall Required Braces or Orthoses: Sling Restrictions Weight Bearing Restrictions Per Provider Order: Yes LUE Weight Bearing Per Provider Order: Non weight bearing Other Position/Activity Restrictions: Lt shoulder NWB, gentle ROM, sling for comfort. C2 ALL injury  Therapy/Group: Individual Therapy  Una Ganser 10/06/2023, 6:14 AM

## 2023-10-06 NOTE — Progress Notes (Signed)
 Nephrology Follow-Up Consult note   Assessment/Recommendations: [name] is a/an 83 y.o. 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    AKI on CKD IV w/ hyperkalemia: Baseline creatinine around 3-4 and seems to fluctuate but has been stable.  Followed by Dr. Zelda Hickman ([facility]) in the outpatient setting and starting to initiate plans to prepare for dialysis.  Exposures including contrast and vancomycin .  Creatinine is relatively stable time.   Patient is on a regular diet; I changed it to a renal diet.  Patient has been eating bananas, has potato soup ordered for today which likely if the potato is cooked in the soup has high potassium, [place] fries.  Pineapples are generally lower in potassium that should be fine, he is eating that every single morning.  Will also give Lokelma  BID x2 doses.  He is already having diarrhea and do not want to put him on a standing dose for now.  Let's try to control primarily with diet  -Continue to monitor daily Cr, Dose meds for GFR -Monitor Daily I/Os, Daily weight  -Maintain MAP>65 for optimal renal perfusion.  -Avoid nephrotoxic medications including NSAIDs -Use synthetic opioids (Fentanyl /Dilaudid ) if needed -Currently no indication for HD   Hyperkalemia Now improved.  Continue to monitor   Hypotension Resolved, now normotensive on Midodrine .  Would start weaning this medication if blood pressures consistently greater than 130 and/or his kidney function starts to improve   Anemia due to CKD: Hgb 7.9, stable with prior days.  Transfusions per primary team   Fall - SAH/neck pain/shoulder pain  Per Primary and NSGY   R pleural effusion S/p thoracentesis of 1.6L, consistent with exudative effusion. Appears recurrent given prior thoracentesis in 05/2023. Defer to Primary.   Leukopenia: Defer to primary  Metabolic acidosis: Continue sodium  bicarbonate as ordered ___________________________________________________________  CC: Fall  Interval History/Subjective:  Patient states he feels weak; appetite still not great.   Medications:  Current Facility-Administered Medications  Medication Dose Route Frequency Provider Last Rate Last Admin   0.9 %  sodium chloride  infusion (Manually program via Guardrails IV Fluids)   Intravenous Once Cherri Corns C, DO       acetaminophen  (TYLENOL ) tablet 500 mg  500 mg Oral Q6H PRN Angiulli, Daniel J, PA-C       Or   acetaminophen  (TYLENOL ) suppository 650 mg  650 mg Rectal Q6H PRN Angiulli, Daniel J, PA-C       amiodarone  (PACERONE ) tablet 200 mg  200 mg Oral Daily Angiulli, Daniel J, PA-C   200 mg at 10/06/23 0849   DULoxetine  (CYMBALTA ) DR capsule 20 mg  20 mg Oral QHS AngiulliEverlyn Hockey, PA-C   20 mg at 10/05/23 2313   feeding supplement (NEPRO CARB STEADY) liquid 237 mL  237 mL Oral TID WC Love, Pamela S, PA-C   237 mL at 10/05/23 1742   guaiFENesin -dextromethorphan  (ROBITUSSIN DM) 100-10 MG/5ML syrup 5 mL  5 mL Oral Q4H PRN Lylia Sand, MD   5 mL at 10/01/23 1833   hydrALAZINE  (APRESOLINE ) tablet 10 mg  10 mg Oral Q6H PRN Engler, Morgan C, DO       levothyroxine  (SYNTHROID ) tablet 100 mcg  100 mcg Oral QAC breakfast Sterling Eisenmenger, PA-C   100 mcg at 10/05/23 0526   lidocaine  (LIDODERM ) 5 % 1 patch  1 patch Transdermal Q24H Angiulli, Daniel J, PA-C   1 patch at 10/05/23  1742   loperamide  (IMODIUM ) capsule 2 mg  2 mg Oral PRN Cherri Corns C, DO       melatonin tablet 3 mg  3 mg Oral QHS PRN Angiulli, Daniel J, PA-C   3 mg at 10/05/23 2313   methocarbamol  (ROBAXIN ) tablet 500 mg  500 mg Oral Q8H PRN Angiulli, Daniel J, PA-C       metoprolol  tartrate (LOPRESSOR ) tablet 25 mg  25 mg Oral BID Nishan, Peter C, MD   25 mg at 10/06/23 5621   multivitamin with minerals tablet 1 tablet  1 tablet Oral Daily Angiulli, Daniel J, PA-C   1 tablet at 10/06/23 0849   Muscle Rub CREA    Topical PRN Lylia Sand, MD       ondansetron  (ZOFRAN ) tablet 4 mg  4 mg Oral Q6H PRN Angiulli, Daniel J, PA-C       Or   ondansetron  (ZOFRAN ) injection 4 mg  4 mg Intravenous Q6H PRN Angiulli, Daniel J, PA-C       pantoprazole  (PROTONIX ) EC tablet 40 mg  40 mg Oral Daily Angiulli, Daniel J, PA-C   40 mg at 10/06/23 3086   QUEtiapine  (SEROQUEL ) tablet 25 mg  25 mg Oral QHS PRN Engler, Morgan C, DO   25 mg at 10/04/23 2140   senna-docusate (Senokot-S) tablet 2 tablet  2 tablet Oral BID Cherri Corns C, DO   2 tablet at 10/06/23 5784   simvastatin  (ZOCOR ) tablet 40 mg  40 mg Oral QHS Angiulli, Daniel J, PA-C   40 mg at 10/05/23 2313   sodium bicarbonate  tablet 1,300 mg  1,300 mg Oral TID Sterling Eisenmenger, PA-C   1,300 mg at 10/06/23 6962   sorbitol  70 % solution 30 mL  30 mL Oral Daily PRN Angiulli, Daniel J, PA-C       traMADol  (ULTRAM ) tablet 50 mg  50 mg Oral Q12H PRN Angiulli, Daniel J, PA-C       Facility-Administered Medications Ordered in Other Encounters  Medication Dose Route Frequency Provider Last Rate Last Admin   epoetin  alfa-epbx (RETACRIT ) injection 10,000 Units  10,000 Units Subcutaneous Once Rao, Archana C, MD       epoetin  alfa-epbx (RETACRIT ) injection 40,000 Units  40,000 Units Subcutaneous Once Avonne Boettcher, MD          Review of Systems: 10 systems reviewed and negative except per interval history/subjective  Physical Exam: Vitals:   10/05/23 2332 10/06/23 0418  BP: (!) 143/103 (!) 158/102  Pulse: (!) 108 (!) 105  Resp: 18 18  Temp: 98.3 F (36.8 C) 98 F (36.7 C)  SpO2: 97% 98%   No intake/output data recorded.  Intake/Output Summary (Last 24 hours) at 10/06/2023 1113 Last data filed at 10/06/2023 9528 Gross per 24 hour  Intake 720 ml  Output 1500 ml  Net -780 ml   Constitutional: Lying in bed, no distress ENMT: ears and nose without scars or lesions, MMM CV: normal rate, no edema Respiratory: Bilateral chest rise, normal work of  breathing Gastrointestinal: soft, nondistended Skin: no visible lesions or rashes Psych: alert, judgement/insight appropriate, appropriate mood and affect   Test Results I personally reviewed new and old clinical labs and radiology tests Lab Results  Component Value Date   NA 140 10/06/2023   K 5.6 (H) 10/06/2023   CL 112 (H) 10/06/2023   CO2 22 10/06/2023   BUN 90 (H) 10/06/2023   CREATININE 3.25 (H) 10/06/2023   CALCIUM  7.2 (L) 10/06/2023  ALBUMIN  2.1 (L) 09/30/2023   PHOS 5.4 (H) 09/29/2023    CBC Recent Labs  Lab 10/04/23 0513 10/04/23 1003 10/05/23 0549 10/06/23 0653  WBC 3.6*  --  3.8* 6.1  NEUTROABS 2.6  --  2.0 3.2  HGB 7.8* 8.5* 7.5* 7.9*  HCT 25.1* 27.9* 23.8* 25.5*  MCV 90.0  --  90.2 91.7  PLT 217  --  194 202

## 2023-10-07 DIAGNOSIS — I480 Paroxysmal atrial fibrillation: Secondary | ICD-10-CM

## 2023-10-07 DIAGNOSIS — I10 Essential (primary) hypertension: Secondary | ICD-10-CM

## 2023-10-07 DIAGNOSIS — N184 Chronic kidney disease, stage 4 (severe): Secondary | ICD-10-CM

## 2023-10-07 DIAGNOSIS — D631 Anemia in chronic kidney disease: Secondary | ICD-10-CM

## 2023-10-07 DIAGNOSIS — E875 Hyperkalemia: Secondary | ICD-10-CM

## 2023-10-07 LAB — BASIC METABOLIC PANEL WITH GFR
Anion gap: 10 (ref 5–15)
BUN: 83 mg/dL — ABNORMAL HIGH (ref 8–23)
CO2: 21 mmol/L — ABNORMAL LOW (ref 22–32)
Calcium: 7.3 mg/dL — ABNORMAL LOW (ref 8.9–10.3)
Chloride: 109 mmol/L (ref 98–111)
Creatinine, Ser: 2.99 mg/dL — ABNORMAL HIGH (ref 0.61–1.24)
GFR, Estimated: 20 mL/min — ABNORMAL LOW (ref >60)
Glucose, Bld: 96 mg/dL (ref 70–99)
Potassium: 5.2 mmol/L — ABNORMAL HIGH (ref 3.5–5.1)
Sodium: 140 mmol/L (ref 135–145)

## 2023-10-07 MED ORDER — METOPROLOL TARTRATE 25 MG PO TABS
37.5000 mg | ORAL_TABLET | Freq: Two times a day (BID) | ORAL | Status: DC
  Administered 2023-10-07 – 2023-10-08 (×2): 37.5 mg via ORAL
  Filled 2023-10-07 (×3): qty 1

## 2023-10-07 NOTE — Progress Notes (Signed)
 Nephrology Follow-Up Consult note   Assessment/Recommendations: [name] is a/an 83 y.o. 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    AKI on CKD IV w/ hyperkalemia: Baseline creatinine around 3-4 and seems to fluctuate but has been stable.  Followed by Dr. Zelda Hickman ([facility]) in the outpatient setting and starting to initiate plans to prepare for dialysis.  Exposures including contrast and vancomycin .  Creatinine is relatively stable time.   Patient is on a regular diet; I changed it to a renal diet.  Patient has been eating bananas, has potato soup ordered for today which likely if the potato is cooked in the soup has high potassium, [place] fries.  Pineapples are generally lower in potassium that should be fine, he is eating that every single morning.  Lokelma  likely causing diarrhea to worsen.  Let's try to control primarily with diet; changed to renal diet and also educated the patient on foods to avoid.  No labs today; ordered a BMET  -Continue to monitor daily Cr, Dose meds for GFR -Monitor Daily I/Os, Daily weight  -Maintain MAP>65 for optimal renal perfusion.  -Avoid nephrotoxic medications including NSAIDs -Use synthetic opioids (Fentanyl /Dilaudid ) if needed -Currently no indication for HD   Hyperkalemia Now improved.  Continue to monitor   Hypotension Resolved, now normotensive on Midodrine .  Would start weaning this medication if blood pressures consistently greater than 130 and/or his kidney function starts to improve   Anemia due to CKD: Hgb 7.9, stable with prior days.  Transfusions per primary team   Fall - SAH/neck pain/shoulder pain  Per Primary and NSGY   R pleural effusion S/p thoracentesis of 1.6L, consistent with exudative effusion. Appears recurrent given prior thoracentesis in 05/2023. Defer to Primary.   Leukopenia: Defer to primary  Metabolic  acidosis: Continue sodium bicarbonate  as ordered ___________________________________________________________  CC: Fall  Interval History/Subjective:  Patient with diarrhea overnight which responded to Imodium ; feels weak; appetite still not great but he really likes pineapples in the morning.   Medications:  Current Facility-Administered Medications  Medication Dose Route Frequency Provider Last Rate Last Admin   0.9 %  sodium chloride  infusion (Manually program via Guardrails IV Fluids)   Intravenous Once Cherri Corns C, DO       acetaminophen  (TYLENOL ) tablet 500 mg  500 mg Oral Q6H PRN Angiulli, Daniel J, PA-C       Or   acetaminophen  (TYLENOL ) suppository 650 mg  650 mg Rectal Q6H PRN Angiulli, Daniel J, PA-C       amiodarone  (PACERONE ) tablet 200 mg  200 mg Oral Daily Angiulli, Daniel J, PA-C   200 mg at 10/07/23 1610   dexamethasone  (DECADRON ) tablet 5 mg  5 mg Oral Daily Engler, Morgan C, DO   5 mg at 10/07/23 9604   Followed by   [name] ON 10/09/2023] dexamethasone  (DECADRON ) tablet 2 mg  2 mg Oral Daily Cherri Corns C, DO       Followed by   [name] ON 10/11/2023] dexamethasone  (DECADRON ) tablet 1 mg  1 mg Oral Daily Cherri Corns C, DO       DULoxetine  (CYMBALTA ) DR capsule 20 mg  20 mg Oral QHS AngiulliEverlyn Hockey, PA-C   20 mg at 10/06/23 2209   feeding supplement (NEPRO CARB STEADY) liquid 237 mL  237 mL Oral TID WC Love, Pamela S, PA-C   237 mL at 10/07/23 0945   guaiFENesin -dextromethorphan  (ROBITUSSIN  DM) 100-10 MG/5ML syrup 5 mL  5 mL Oral Q4H PRN Lylia Sand, MD   5 mL at 10/01/23 1833   hydrALAZINE  (APRESOLINE ) tablet 10 mg  10 mg Oral Q6H PRN Engler, Morgan C, DO       levothyroxine  (SYNTHROID ) tablet 100 mcg  100 mcg Oral QAC breakfast Sterling Eisenmenger, PA-C   100 mcg at 10/07/23 1610   lidocaine  (LIDODERM ) 5 % 1 patch  1 patch Transdermal Q24H Angiulli, Daniel J, PA-C   1 patch at 10/06/23 1815   loperamide  (IMODIUM ) capsule 2 mg  2 mg Oral PRN Engler, Morgan  C, DO   2 mg at 10/06/23 2209   melatonin tablet 3 mg  3 mg Oral QHS PRN Angiulli, Daniel J, PA-C   3 mg at 10/05/23 2313   methocarbamol  (ROBAXIN ) tablet 500 mg  500 mg Oral Q8H PRN Angiulli, Daniel J, PA-C       metoprolol  tartrate (LOPRESSOR ) tablet 37.5 mg  37.5 mg Oral BID Swartz, Zachary T, MD       multivitamin with minerals tablet 1 tablet  1 tablet Oral Daily Angiulli, Daniel J, PA-C   1 tablet at 10/07/23 0937   Muscle Rub CREA   Topical PRN Lylia Sand, MD       ondansetron  (ZOFRAN ) tablet 4 mg  4 mg Oral Q6H PRN Angiulli, Daniel J, PA-C       Or   ondansetron  (ZOFRAN ) injection 4 mg  4 mg Intravenous Q6H PRN Angiulli, Daniel J, PA-C       pantoprazole  (PROTONIX ) EC tablet 40 mg  40 mg Oral Daily Angiulli, Daniel J, PA-C   40 mg at 10/07/23 9604   QUEtiapine  (SEROQUEL ) tablet 25 mg  25 mg Oral QHS PRN Engler, Morgan C, DO   25 mg at 10/06/23 2210   senna-docusate (Senokot-S) tablet 2 tablet  2 tablet Oral BID Cherri Corns C, DO   2 tablet at 10/07/23 5409   simvastatin  (ZOCOR ) tablet 40 mg  40 mg Oral QHS Angiulli, Daniel J, PA-C   40 mg at 10/06/23 2209   sodium bicarbonate  tablet 1,300 mg  1,300 mg Oral TID Sterling Eisenmenger, PA-C   1,300 mg at 10/07/23 8119   sorbitol  70 % solution 30 mL  30 mL Oral Daily PRN Angiulli, Daniel J, PA-C       traMADol  (ULTRAM ) tablet 50 mg  50 mg Oral Q12H PRN Angiulli, Daniel J, PA-C       Facility-Administered Medications Ordered in Other Encounters  Medication Dose Route Frequency Provider Last Rate Last Admin   epoetin  alfa-epbx (RETACRIT ) injection 10,000 Units  10,000 Units Subcutaneous Once Rao, Archana C, MD       epoetin  alfa-epbx (RETACRIT ) injection 40,000 Units  40,000 Units Subcutaneous Once Avonne Boettcher, MD          Review of Systems: 10 systems reviewed and negative except per interval history/subjective  Physical Exam: Vitals:   10/06/23 1933 10/07/23 0536  BP: 124/86 (!) 141/107  Pulse: (!) 110 (!) 110  Resp: 18  17  Temp: 98.9 F (37.2 C) (!) 97.5 F (36.4 C)  SpO2: 98% 98%   Total I/O In: -  Out: 125 [Urine:125]  Intake/Output Summary (Last 24 hours) at 10/07/2023 1117 Last data filed at 10/07/2023 1005 Gross per 24 hour  Intake 480 ml  Output 650 ml  Net -170 ml   Constitutional: Lying in bed, no distress ENMT: ears and nose without scars or lesions,  MMM CV: normal rate, no edema Respiratory: Bilateral chest rise, normal work of breathing Gastrointestinal: soft, nondistended Skin: no visible lesions or rashes Psych: alert, judgement/insight appropriate, appropriate mood and affect   Test Results I personally reviewed new and old clinical labs and radiology tests Lab Results  Component Value Date   NA 140 10/06/2023   K 5.6 (H) 10/06/2023   CL 112 (H) 10/06/2023   CO2 22 10/06/2023   BUN 90 (H) 10/06/2023   CREATININE 3.25 (H) 10/06/2023   CALCIUM  7.2 (L) 10/06/2023   ALBUMIN  2.1 (L) 09/30/2023   PHOS 5.4 (H) 09/29/2023    CBC Recent Labs  Lab 10/04/23 0513 10/04/23 1003 10/05/23 0549 10/06/23 0653  WBC 3.6*  --  3.8* 6.1  NEUTROABS 2.6  --  2.0 3.2  HGB 7.8* 8.5* 7.5* 7.9*  HCT 25.1* 27.9* 23.8* 25.5*  MCV 90.0  --  90.2 91.7  PLT 217  --  194 202

## 2023-10-07 NOTE — Progress Notes (Addendum)
 PROGRESS NOTE   Subjective/Complaints: Pt slept better after getting imodium . Had a type 6 stool around 2100. Asked if he was getting blood today. C/o fatigue, no frank dizziness  ROS: Patient denies fever, rash, sore throat, blurred vision, dizziness, nausea, vomiting,  cough, shortness of breath or chest pain, joint or back/neck pain, headache, or mood change.   Objective:   VAS US  LOWER EXTREMITY VENOUS (DVT) Result Date: 10/06/2023  Lower Venous DVT Study Patient Name:  [name]  Date of Exam:   10/06/2023 Medical Rec #: [id number]          Accession #:    [id number] Date of Birth: 1940-12-22          Patient Gender: M Patient Age:   83 years Exam Location:  [facility] Procedure:      VAS US  LOWER EXTREMITY VENOUS (DVT) Referring Phys: Cherri Corns --------------------------------------------------------------------------------  Indications: Edema. Other Indications: Rehab. Comparison Study: Previous exam on 05/03/2023 was negaitve for DVT Performing Technologist: Arlyce Berger RVT, RDMS  Examination Guidelines: A complete evaluation includes B-mode imaging, spectral Doppler, color Doppler, and power Doppler as needed of all accessible portions of each vessel. Bilateral testing is considered an integral part of a complete examination. Limited examinations for reoccurring indications may be performed as noted. The reflux portion of the exam is performed with the patient in reverse Trendelenburg.  +---------+---------------+---------+-----------+----------+--------------+ RIGHT    CompressibilityPhasicitySpontaneityPropertiesThrombus Aging +---------+---------------+---------+-----------+----------+--------------+ CFV      Full           Yes      Yes                                 +---------+---------------+---------+-----------+----------+--------------+ SFJ      Full                                                         +---------+---------------+---------+-----------+----------+--------------+ FV Prox  Full           Yes      Yes                                 +---------+---------------+---------+-----------+----------+--------------+ FV Mid   Full           Yes      Yes                                 +---------+---------------+---------+-----------+----------+--------------+ FV DistalFull           Yes      Yes                                 +---------+---------------+---------+-----------+----------+--------------+ PFV      Full                                                        +---------+---------------+---------+-----------+----------+--------------+  POP      Full           Yes      Yes                                 +---------+---------------+---------+-----------+----------+--------------+ PTV      Full                                                        +---------+---------------+---------+-----------+----------+--------------+ PERO     Full                                                        +---------+---------------+---------+-----------+----------+--------------+   +---------+---------------+---------+-----------+----------+--------------+ LEFT     CompressibilityPhasicitySpontaneityPropertiesThrombus Aging +---------+---------------+---------+-----------+----------+--------------+ CFV      Full           Yes      Yes                                 +---------+---------------+---------+-----------+----------+--------------+ SFJ      Full                                                        +---------+---------------+---------+-----------+----------+--------------+ FV Prox  Full           Yes      Yes                                 +---------+---------------+---------+-----------+----------+--------------+ FV Mid   Full           Yes      Yes                                  +---------+---------------+---------+-----------+----------+--------------+ FV DistalFull           Yes      Yes                                 +---------+---------------+---------+-----------+----------+--------------+ PFV      Full                                                        +---------+---------------+---------+-----------+----------+--------------+ POP      Full           Yes      Yes                                 +---------+---------------+---------+-----------+----------+--------------+ PTV  Full                                                        +---------+---------------+---------+-----------+----------+--------------+ PERO     Full                                                        +---------+---------------+---------+-----------+----------+--------------+     Summary: BILATERAL: - No evidence of deep vein thrombosis seen in the lower extremities, bilaterally. -No evidence of popliteal cyst, bilaterally. -Subcutaneous edema, bilaterally.  *See table(s) above for measurements and observations.    Preliminary    DG Chest 2 View Result Date: 10/06/2023 CLINICAL DATA:  142230 Pleural effusion 142230 EXAM: CHEST - 2 VIEW COMPARISON:  October 03, 2023 FINDINGS: Small right pleural effusion with right basilar airspace disease, unchanged. Unchanged cardiomegaly. Tortuous aorta with aortic atherosclerosis. No acute fracture or destructive lesions. Multilevel thoracic osteophytosis. Partially visualized right shoulder arthroplasty. IMPRESSION: Unchanged small right pleural effusion with right basilar atelectasis. Electronically Signed   By: Rance Burrows M.D.   On: 10/06/2023 12:24    Recent Labs    10/05/23 0549 10/06/23 0653  WBC 3.8* 6.1  HGB 7.5* 7.9*  HCT 23.8* 25.5*  PLT 194 202   Recent Labs    10/05/23 0549 10/06/23 0653  NA 139 140  K 5.1 5.6*  CL 109 112*  CO2 20* 22  GLUCOSE 92 102*  BUN 93* 90*  CREATININE 3.28* 3.25*   CALCIUM  7.2* 7.2*    Intake/Output Summary (Last 24 hours) at 10/07/2023 0809 Last data filed at 10/07/2023 0538 Gross per 24 hour  Intake 952 ml  Output 525 ml  Net 427 ml         Physical Exam: Vital Signs Blood pressure (!) 141/107, pulse (!) 110, temperature (!) 97.5 F (36.4 C), resp. rate 17, height 5\' 11"  (1.803 m), weight 74.8 kg, SpO2 98%.  Constitutional: No distress . Vital signs reviewed. HEENT: NCAT, EOMI, oral membranes moist Neck: supple Cardiovascular: sinus tach, without murmur. No JVD    Respiratory/Chest: CTA Bilaterally without wheezes or rales. Normal effort    GI/Abdomen: BS +, non-tender, non-distended Ext: no clubbing, cyanosis, or edema Psych: pleasant and cooperative  Skin: No evidence of breakdown, no evidence of rash.  Facial scabs stable, healing. scattered abrasions on arms. Neurologic:  Awake, alert, and oriented x 4. 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 Cranial nerves II through XII intact, motor strength is 4-5-/5 throughout, no apparent sensory deficits. Prior neuro assessment is c/w today's exam 10/07/2023.  Musculoskeletal: Full range of motion in all 4 extremities. No joint swelling. No calf tenderness.     Assessment/Plan: 1. Functional deficits which require 3+ hours per day of interdisciplinary therapy in a comprehensive inpatient rehab setting. Physiatrist is providing close team supervision and 24 hour management of active medical problems listed below. Physiatrist and rehab team continue to assess barriers to discharge/monitor patient progress toward functional and medical goals  Care Tool:  Bathing    Body parts bathed by patient: Right arm, Left arm, Chest, Abdomen, Right upper leg, Left upper leg, Right lower leg, Left lower leg, Face  Body parts bathed by helper: Front perineal area, Buttocks     Bathing assist Assist Level: Minimal Assistance - Patient > 75%     Upper Body Dressing/Undressing Upper  body dressing   What is the patient wearing?: Pull over shirt    Upper body assist Assist Level: Set up assist    Lower Body Dressing/Undressing Lower body dressing      What is the patient wearing?: Underwear/pull up, Pants     Lower body assist Assist for lower body dressing: Contact Guard/Touching assist     Toileting Toileting    Toileting assist Assist for toileting: Moderate Assistance - Patient 50 - 74%     Transfers Chair/bed transfer  Transfers assist     Chair/bed transfer assist level: Contact Guard/Touching assist     Locomotion Ambulation   Ambulation assist      Assist level: Contact Guard/Touching assist Assistive device: Walker-rolling Max distance: 190'   Walk 10 feet activity   Assist     Assist level: Contact Guard/Touching assist Assistive device: Walker-rolling   Walk 50 feet activity   Assist    Assist level: Contact Guard/Touching assist Assistive device: Walker-rolling    Walk 150 feet activity   Assist Walk 150 feet activity did not occur: Safety/medical concerns (fatigue)  Assist level: Contact Guard/Touching assist Assistive device: Walker-rolling    Walk 10 feet on uneven surface  activity   Assist     Assist level: Minimal Assistance - Patient > 75% Assistive device: Other (comment), Cane-quad (L handrail)   Wheelchair     Assist Is the patient using a wheelchair?: Yes Type of Wheelchair: Manual    Wheelchair assist level: Dependent - Patient 0% Max wheelchair distance: 167ft    Wheelchair 50 feet with 2 turns activity    Assist        Assist Level: Dependent - Patient 0%   Wheelchair 150 feet activity     Assist      Assist Level: Dependent - Patient 0%   Blood pressure (!) 141/107, pulse (!) 110, temperature (!) 97.5 F (36.4 C), resp. rate 17, height 5\' 11"  (1.803 m), weight 74.8 kg, SpO2 98%.  Medical Problem List and Plan: 1. Functional deficits secondary to  traumatic SAH fall             -patient may  shower             -ELOS/Goals: PT/OT/SLP Supervision, 10-12 days - 10/10/23 DC             ` Stable to continue CIR  4/19: Poor adherence to LUE NWB, otherwise SPV OT. Low-120s HR with standing and walking. Some cog deficits c/b personality.    Coordination for week of 5/5: Will need blood transfusion just prior to discharge per [name].  If right pleural effusion remains stable, set up outpatient pulmonology referral on DC.  If effusion re-accumulates, get pulmonology consult while inpatient and order flow cytometry for tap.  2.  Antithrombotics: -DVT/anticoagulation:  Mechanical: Antiembolism stockings, thigh (TED hose) Bilateral lower extremities.  Chronic Eliquis  currently on hold due to [facility] -dopplers clear 5/2, start SCD's             -antiplatelet therapy: N/A  -4-29: With ongoing tachycardia and shortness of breath off of AC, need to rule out PE.  Discussed with cardiology and nephrology, will get D-dimer --> nuc med scan if positive.  Will also order bilateral lower extremity duplex  4/30; respiratory symptoms resolved, tachycardia  improved with treatment of a flutter.  D-dimer was positive, however explained by comorbidities and with low suspicion for PE at this point; DC nuc med scan.   3. Pain Management: LidoDerm  patch, Robaxin  as needed, tramadol  as needed 4. Mood/Behavior/Sleep: Provide emotional support             -antipsychotic agents: Seroquel  25 mg nightly 5. Neuropsych/cognition: This patient is capable of making decisions on his own behalf. 6. Skin/Wound Care: Routine skin checks 7. Fluids/Electrolytes/Nutrition: Routine ins and outs with follow-up chemistries  - 4-28: Very poor p.o. intakes over the weekend, 0% of meals and only 240 cc of p.o. intakes yesterday.  Will get nutrition consult to help evaluate today.  4-30: Eating a little better, at least 1 meal 100 %/day.  5-2: Nephrology adjusted to renal diet; patient endorsed  this has been discussed with him in the past but he does not know what constitutes a renal diet.  Nutritional consult to assist with education today.  8.  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 Neurosurgery follow-up no current plan for surgical intervention however patient is currently maintained on Decadron  therapy for pain control.  - 5/2: Wean off Decadron  due to recurrent hyperkalemia, hypertension.  Given that patient has been on this medication more than 2 weeks, we will start wean over the next 6 days:   - 5 mg daily x2 days   - 2 mg daily x2 days   - 1 mg dailg x2 days   - stop  9.  Recurrent large right pleural effusion.  Status post ultrasound thoracentesis 1.6 L fluid removed.  - 4-28: Chest x-ray today with recurrence, IR tapped for 825 cc of dark amber fluid.  Follow-up chest x-ray improved.   4/29: CXR for ongoing crackles and opacity yesterday - showing recurrent pleural effusion, opacity from yesterday not well-visualized.  Per cardiology, effusion unlikely due to CHF and would avoid IV diuretics due to renal disease; will need to drain again with recurrence.   5-1: Per [name], request pulmonary consult/referral prior to discharge and, if effusion is retapped, flow cytometry testing.  Will get repeat chest x-ray in a.m. for monitoring, otherwise seems stable from a respiratory standpoint at this point so no emergent tap.  5/2: Chest x-ray this morning with stable, small right pleural effusion and right basilar atelectasis.  Add incentive spirometry.  Respiratory status stable, no indication for tap at this time.   -5/3 arrange follow up with pulmonary as outpt   10.  PAF.  Rate controlled.  Continue amiodarone .  Eliquis  on hold due to [facility].  - 4-28: rergular tachycardia today, c/b anemia and pleural effusion.  Treat  causative sources first. - 4.29: remains tachycardic,  regular rhythm. EKG showing atrial flutter.  Added metoprolol  12.5 twice daily, cardiology consulted, appreciate recommendations. 4-30: Rate improved, patient feeling much better.  Cardiology increased metoprolol  to 25 mg twice daily.  Also increased amiodarone .  Appreciate their insight and recommendations. 5-3: Heart rate still elevated at times but improved. Reg rhythm this morning.  Appreciate cardiology assistance.  11.  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  Follow-up nephrology services.  Renal ultrasound no hydronephrosis.  Currently no plans for HD. Avoid nephrotoxins. Monitor labs. Maintain MAP > 65  - 4-28: Creatinine down to 3.6 from prior 4.5; BUN is increasing.  Hyperkalemic status post transfusion today.  Has been on continuous IVF--Dced today   - 4/29: Per nephro, if contrast needed, avoid ACE/ARB  12.  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  Follow-up orthopedic services Dr. Abigail Abler.    13.  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  Followed by oncology services [name].  Prior to admission patient was receiving Revlimid  5 mg mouth daily for 21 days then hold for 7 days repeating every 28 days  4/28: Hemoglobin steadily downtrending to 7.1 today; given persistent tachycardia and symptoms, will transfuse 1 unit CMV negative today and follow with 40 mg IV Lasix .  4/29: HgB improved 8.9; monitor  4/30: Down to 7.8 again, WBC down to 3.6. H/H, FOBT ordered. --H&H 8.5, unchanged  5/1: HgB 7.5, WBC stable 3.8. FOBT negative.  Spoke with oncology on-call and [name].  Per [name], no need for daily monitoring, expect patient to continue to drop his hemoglobin, would transfuse once just prior to discharge and follow-up with her shortly after; already scheduled.   5/3 hgb actually bumped a bit to 7.9 today--can transfuse him Monday prior to discharge home Tuesday 5/6 14.  Hypothyroidism.   Synthroid  15.  Hyperlipidemia.  Zocor  16.  History of BPH.  Check PVR--no retention per documentation. 17.  GERD.  Protonix  18.  Hypotension/hypertension.  Followed by renal services with plan to slowly wean ProAmatine . Vitals:   10/06/23 1933 10/07/23 0536  BP: 124/86 (!) 141/107  Pulse: (!) 110 (!) 110  Resp: 18 17  Temp: 98.9 F (37.2 C) (!) 97.5 F (36.4 C)  SpO2: 98% 98%   Supine systolic BPs mildly elevated will order ortho vitals to see if midodrine  can be reduced  -4/28: Severe hypertension overnight.  Reduce midodrine  to 5 mg twice daily at 8 AM and noon, get orthostatic vitals today. Encourage PO fluids--remained hypertensive today, decrease midodrine  to 2.5 mg twice daily tomorrow a.m.  - 4/29: Remains elevated, DC midodrine .  - 4-30: Normotensive off of midodrine , monitor 5/2:  elevated, will start weaning dexamethasone  as above.  Add hydralazine  10 mg as needed for systolic greater than 170, diastolic greater than 110. -5/3 persistent elevation. Steroids being weaned.  -given HR issues, will increase metoprolol  to 37.5mg  bid 19. Occasional cough, reports started couple hours ago             -Start robitussin              -Repeat Xray chest if does not improve  - 4-28: Chest x-ray today with pleural effusion, tapped as above.  Remaining right middle lobe opacity.  No fevers, WBC stable.  Monitor respiratory status following effusion tap.   - 4.29: see above; remains afebrile, WBC stable - monitor  5-2: Incentive spirometry as above  20. Leukopenia- resolved             -Oncology s/p  GCSF, CBC daily    Latest Ref Rng & Units 10/06/2023    6:53 AM 10/05/2023    5:49 AM 10/04/2023   10:03 AM  CBC  WBC 4.0 - 10.5 K/uL 6.1  3.8    Hemoglobin 13.0 - 17.0 g/dL 7.9  7.5  8.5   Hematocrit 39.0 - 52.0 % 25.5  23.8  27.9   Platelets 150 - 400 K/uL 202  194      - 4-30: Recurrent.  Continue daily CBC.--stable  5-2: Per [name], no need for daily monitoring, will monitor q.  Monday/Thursday  21. L knee OA             -  Xray nov 2024 indicates moderate OA             -Muscle rub prn     22.  Left AC joint disruption - Nonweightbearing left upper extremity with shoulder sling for comfort.    - 4/29: Per ortho OK for WBAT; order changed  23.  Hyperkalemia.  5.8.  Likely secondary to blood transfusion today, already got 40 mg of IV Lasix  for treatment.  Repeat BMP in a.m., continue IV fluids.  - Ensure changed to Nepro  - 4/29: Cr stable, K improved 5.3. Will give Lokelma  to avoid diuretics. Repeat in AM. EKG w/o T wave changes  4-30: K remains 5.3, no bowel movement yesterday.  Will give another dose Lokelma  5 mg x 1, and increase bowel regimen for effect.  5/1: BM yesterday, BMP pending--resolved, 5.1.  5/2: Multiple bowel movements overnight, potassium up to 5.6.  DC Decadron  as above.  Nephrology to evaluate today, appreciate their recommendations and assistance.--Ordered Lokelma  10 mg  5/3 appreciate renal rec. Changed to renal diet. 24. Diarrhea, acute on chronic.   5-2: Up last night with diarrhea, likely secondary to Lokelma  and increased bowel regimen.  DC MiraLAX , added as needed Imodium .  5/3 improving. Last bm at 9pm yesterday. Imodium  prn 8 days A FACE TO FACE EVALUATION WAS PERFORMED  [name] 10/07/2023, 8:09 AM

## 2023-10-07 NOTE — Progress Notes (Signed)
 Occupational Therapy Session Note  Patient Details  Name: [name] MRN: [id number] Date of Birth: December 25, 1940  Today's Date: 10/07/2023 OT Individual Time: 2595-6387 OT Individual Time Calculation (min): 40 min    Short Term Goals: Week 2:  OT Short Term Goal 1 (Week 2): STG=LTG d/t ELOS  Skilled Therapeutic Interventions/Progress Updates:  Skilled OT intervention completed with focus on activity tolerance, ADL retraining. Pt received supine in bed, asleep and required increased volume to arouse due to [facility] nature. Cues needed to initiate donning hearing aids for communication, and mod A to donn in ears due to "weak fingers" when asked to do himself. Pt verbalized fatigue and light headedness and per chart, hasn't received blood transfusion for low hemoglobin. Pt denied pain during session.   Pt remained slow to initiate with delayed processing, requiring OT to cue pt step by step for ADLs. Pt declined OOB mobility due to light headedness. Pt also appearing slightly self-limiting as pt frequently stated "my wife will give me a bath" or "my son normally shaves me." Gentle education provided on purpose of completing ADLs with independence for functional health.   Unfastened brief with max A, then min A for urinal placement then pt continent of urinary void. Set up A for washing front peri area, then assist needed to refasten brief. Transitioned semi upright > sit EOB with supervision. Doffed shirt without assist, and required supervision (for initiating) for rest of body bathing seated, donning of zip up jacket, threading of LB clothing. CGA sit > stand using RW with pt using hand on front bar of RW to power up with minimal correction despite cues offered for hand placement. Donned shorts over hips, however assist needed to button/zip shorts due to tighter fit and FMC deficits. Donned shoes with set up A.   Pt stood in similar manner with CGA using RW, then ambulated about 50 ft with CGA, with  cues for body positioning with turns especially. Pt declined sitting up in w/c or further mobility. Transitioned back to bed with supervision. Pt remained upright in bed, with bed alarm on/activated, and with all needs in reach at end of session.   Therapy Documentation Precautions:  Precautions Precautions: Fall Required Braces or Orthoses: Sling Restrictions Weight Bearing Restrictions Per Provider Order: Yes LUE Weight Bearing Per Provider Order: weight bearing as tolerated Other Position/Activity Restrictions: Lt shoulder WBAT, gentle ROM, sling for comfort. C2 ALL injury    Therapy/Group: Individual Therapy  Ruthanna Covert, MS, OTR/L  10/07/2023, 8:46 AM

## 2023-10-08 DIAGNOSIS — Z5181 Encounter for therapeutic drug level monitoring: Secondary | ICD-10-CM

## 2023-10-08 DIAGNOSIS — E440 Moderate protein-calorie malnutrition: Secondary | ICD-10-CM

## 2023-10-08 DIAGNOSIS — Z7901 Long term (current) use of anticoagulants: Secondary | ICD-10-CM

## 2023-10-08 LAB — PREPARE RBC (CROSSMATCH)

## 2023-10-08 MED ORDER — METOPROLOL TARTRATE 50 MG PO TABS
50.0000 mg | ORAL_TABLET | Freq: Two times a day (BID) | ORAL | Status: DC
  Administered 2023-10-08 – 2023-10-10 (×4): 50 mg via ORAL
  Filled 2023-10-08 (×4): qty 1

## 2023-10-08 MED ORDER — SODIUM ZIRCONIUM CYCLOSILICATE 5 G PO PACK
5.0000 g | PACK | ORAL | Status: DC
  Administered 2023-10-09: 5 g via ORAL
  Filled 2023-10-08: qty 1

## 2023-10-08 MED ORDER — ACETAMINOPHEN 325 MG PO TABS
650.0000 mg | ORAL_TABLET | Freq: Once | ORAL | Status: AC
  Administered 2023-10-08: 650 mg via ORAL
  Filled 2023-10-08: qty 2

## 2023-10-08 MED ORDER — SODIUM CHLORIDE 0.9% IV SOLUTION: Freq: Once | INTRAVENOUS | Status: AC

## 2023-10-08 MED ORDER — SODIUM ZIRCONIUM CYCLOSILICATE 5 G PO PACK: 5.0000 g | PACK | ORAL | Status: DC

## 2023-10-08 MED ORDER — DIPHENHYDRAMINE HCL 25 MG PO CAPS
25.0000 mg | ORAL_CAPSULE | Freq: Once | ORAL | Status: AC
  Administered 2023-10-08: 25 mg via ORAL
  Filled 2023-10-08: qty 1

## 2023-10-08 NOTE — Progress Notes (Signed)
 Physical Therapy Session Note  Patient Details  Name: [name] MRN: [id number] Date of Birth: Jul 24, 1940  Today's Date: 10/08/2023 PT Individual Time: 0800-0900 PT Individual Time Calculation (min): 60 min   Short Term Goals: Week 1:  PT Short Term Goal 1 (Week 1): pt will perform all transfers with LRAD and CGA PT Short Term Goal 2 (Week 1): pt will ambulate 175ft with LRAD and CGA PT Short Term Goal 3 (Week 1): pt will navigate 4 6in steps with 1 handrail and CGA  Skilled Therapeutic Interventions/Progress Updates: Pt presents supine in bed and agreeable to therapy, although c/o lightheadedness.  BP measured by SN at 142/95 and HR of 117 in supine.  Pt performed LE there ex for increased strength and circulation.  Pt performed AP, HS and SAQ 3 x 10-15 w/ rest breaks between sets.  PT threads pants over feet and pt pulls up partially over buttocks by slight rolling.  Pt cued for bridging and completed donning.  Pt transfers sup to sit through log roll w/ supervision using side rail.  Pt BP measured in sitting at 141/87, HR 115.  Pt states lightheadedness at 8/10 in sitting.  Pt transfers sit to stand w/ CGA and RW.  BP in standing at 153/94, HR 118.  Pt amb to w/c and wheeled to dayroom for energy conservation.  Pt amb x 80' w/ RW and CGA including turn to return to w/c.  No change in lightheadedness.  Pt requests need to return to room.  Pt amb x 128' w/ RW to bed w/ CGA.  Pt transferred sit to supine w/ supervision.  Pt remained in bed w/ bed alarm on and all needs in reach.  Missed time of 15' 2/2 lightheadedness, fatigue.     Therapy Documentation Precautions:  Precautions Precautions: Fall Required Braces or Orthoses: Sling Restrictions Weight Bearing Restrictions Per Provider Order: Yes LUE Weight Bearing Per Provider Order: Non weight bearing Other Position/Activity Restrictions: Lt shoulder NWB, gentle ROM, sling for comfort. C2 ALL injury General: PT Amount of Missed Time  (min): 15 Minutes PT Missed Treatment Reason: Patient fatigue;Patient ill (Comment) (lightheaded.) Vital Signs: Therapy Vitals Temp: 98.4 F (36.9 C) Temp Source: Oral Pulse Rate: (!) 116 Resp: 18 BP: (!) 142/95 Oxygen Therapy SpO2: 100 % O2 Device: Room Air   Therapy/Group: Individual Therapy  Tiasia Weberg P Brieana Shimmin 10/08/2023, 9:02 AM

## 2023-10-08 NOTE — Progress Notes (Signed)
 Cardiologist:  Jerelene Monday  Subjective:   Some dizziness working with PT   Objective:  Vitals:   10/08/23 0448 10/08/23 0454 10/08/23 0744 10/08/23 0812  BP: (!) 140/100  (!) 154/99 (!) 142/95  Pulse: (!) 110  (!) 113 (!) 116  Resp: 18   18  Temp: 98.6 F (37 C)   98.4 F (36.9 C)  TempSrc: Oral   Oral  SpO2: 98%   100%  Weight:  74 kg    Height:        Intake/Output from previous day:  Intake/Output Summary (Last 24 hours) at 10/08/2023 0959 Last data filed at 10/08/2023 0730 Gross per 24 hour  Intake 1172 ml  Output 525 ml  Net 647 ml    Physical Exam: Mild cognitive defect Bruising over left eye Lungs decreased BS right base MR apical murmur Abdomen benign Trace edema   Lab Results: Basic Metabolic Panel: Recent Labs    10/06/23 0653 10/07/23 1141  NA 140 140  K 5.6* 5.2*  CL 112* 109  CO2 22 21*  GLUCOSE 102* 96  BUN 90* 83*  CREATININE 3.25* 2.99*  CALCIUM  7.2* 7.3*    CBC: Recent Labs    10/06/23 0653  WBC 6.1  NEUTROABS 3.2  HGB 7.9*  HCT 25.5*  MCV 91.7  PLT 202    D-Dimer: No results for input(s): "DDIMER" in the last 72 hours.   Imaging: VAS US  LOWER EXTREMITY VENOUS (DVT) Result Date: 10/07/2023  Lower Venous DVT Study Patient Name:  [name]  Date of Exam:   10/06/2023 Medical Rec #: [id number]          Accession #:    [id number] Date of Birth: 06/03/1941          Patient Gender: M Patient Age:   83 years Exam Location:  [facility] [place] Procedure:      VAS US  LOWER EXTREMITY VENOUS (DVT) Referring Phys: Cherri Corns --------------------------------------------------------------------------------  Indications: Edema. Other Indications: Rehab. Comparison Study: Previous exam on 05/03/2023 was negaitve for DVT Performing Technologist: Arlyce Berger RVT, RDMS  Examination Guidelines: A complete evaluation includes B-mode imaging, spectral Doppler, color Doppler, and power Doppler as needed of all accessible portions of each vessel.  Bilateral testing is considered an integral part of a complete examination. Limited examinations for reoccurring indications may be performed as noted. The reflux portion of the exam is performed with the patient in reverse Trendelenburg.  +---------+---------------+---------+-----------+----------+--------------+ RIGHT    CompressibilityPhasicitySpontaneityPropertiesThrombus Aging +---------+---------------+---------+-----------+----------+--------------+ CFV      Full           Yes      Yes                                 +---------+---------------+---------+-----------+----------+--------------+ SFJ      Full                                                        +---------+---------------+---------+-----------+----------+--------------+ FV Prox  Full           Yes      Yes                                 +---------+---------------+---------+-----------+----------+--------------+  FV Mid   Full           Yes      Yes                                 +---------+---------------+---------+-----------+----------+--------------+ FV DistalFull           Yes      Yes                                 +---------+---------------+---------+-----------+----------+--------------+ PFV      Full                                                        +---------+---------------+---------+-----------+----------+--------------+ POP      Full           Yes      Yes                                 +---------+---------------+---------+-----------+----------+--------------+ PTV      Full                                                        +---------+---------------+---------+-----------+----------+--------------+ PERO     Full                                                        +---------+---------------+---------+-----------+----------+--------------+   +---------+---------------+---------+-----------+----------+--------------+ LEFT      CompressibilityPhasicitySpontaneityPropertiesThrombus Aging +---------+---------------+---------+-----------+----------+--------------+ CFV      Full           Yes      Yes                                 +---------+---------------+---------+-----------+----------+--------------+ SFJ      Full                                                        +---------+---------------+---------+-----------+----------+--------------+ FV Prox  Full           Yes      Yes                                 +---------+---------------+---------+-----------+----------+--------------+ FV Mid   Full           Yes      Yes                                 +---------+---------------+---------+-----------+----------+--------------+ FV DistalFull  Yes      Yes                                 +---------+---------------+---------+-----------+----------+--------------+ PFV      Full                                                        +---------+---------------+---------+-----------+----------+--------------+ POP      Full           Yes      Yes                                 +---------+---------------+---------+-----------+----------+--------------+ PTV      Full                                                        +---------+---------------+---------+-----------+----------+--------------+ PERO     Full                                                        +---------+---------------+---------+-----------+----------+--------------+     Summary: BILATERAL: - No evidence of deep vein thrombosis seen in the lower extremities, bilaterally. -No evidence of popliteal cyst, bilaterally. -Subcutaneous edema, bilaterally.  *See table(s) above for measurements and observations. Electronically signed by Delaney Fearing on 10/07/2023 at 11:38:15 AM.    Final      Cardiac Studies:  ECG: Likely flutter 118 10/03/23 pending this am 5/1 flutter rate 77 bpm   Telemetry: not on    Echo: 07/28/23 stable EF 50-55% mild/mod MR   Medications:    sodium chloride    Intravenous Once   amiodarone   200 mg Oral Daily   [START ON 10/09/2023] dexamethasone   2 mg Oral Daily   Followed by   [name] ON 10/11/2023] dexamethasone   1 mg Oral Daily   DULoxetine   20 mg Oral QHS   feeding supplement (NEPRO CARB STEADY)  237 mL Oral TID WC   levothyroxine   100 mcg Oral QAC breakfast   lidocaine   1 patch Transdermal Q24H   metoprolol  tartrate  37.5 mg Oral BID   multivitamin with minerals  1 tablet Oral Daily   pantoprazole   40 mg Oral Daily   senna-docusate  2 tablet Oral BID   simvastatin   40 mg Oral QHS   sodium bicarbonate   1,300 mg Oral TID   [START ON 10/09/2023] sodium zirconium cyclosilicate   5 g Oral Q M,W,F      Assessment/Plan:   PAF: has had two prior [facility]. Not on anticoagulation due to recent [facility]. 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

## 2023-10-08 NOTE — Progress Notes (Signed)
 PROGRESS NOTE   Subjective/Complaints: Slept well. Felt dizzy and a weak with therapy yesterday. No other complaints this morning  ROS: Patient denies fever, rash, sore throat, blurred vision,  nausea, vomiting, diarrhea, cough, shortness of breath or chest pain, joint or back/neck pain, headache, or mood change.   Objective:   VAS US  LOWER EXTREMITY VENOUS (DVT) Result Date: 10/07/2023  Lower Venous DVT Study Patient Name:  [name]  Date of Exam:   10/06/2023 Medical Rec #: [id number]          Accession #:    [id number] Date of Birth: 24-Nov-1940          Patient Gender: M Patient Age:   83 years Exam Location:  [facility] Procedure:      VAS US  LOWER EXTREMITY VENOUS (DVT) Referring Phys: Cherri Corns --------------------------------------------------------------------------------  Indications: Edema. Other Indications: Rehab. Comparison Study: Previous exam on 05/03/2023 was negaitve for DVT Performing Technologist: Arlyce Berger RVT, RDMS  Examination Guidelines: A complete evaluation includes B-mode imaging, spectral Doppler, color Doppler, and power Doppler as needed of all accessible portions of each vessel. Bilateral testing is considered an integral part of a complete examination. Limited examinations for reoccurring indications may be performed as noted. The reflux portion of the exam is performed with the patient in reverse Trendelenburg.  +---------+---------------+---------+-----------+----------+--------------+ RIGHT    CompressibilityPhasicitySpontaneityPropertiesThrombus Aging +---------+---------------+---------+-----------+----------+--------------+ CFV      Full           Yes      Yes                                 +---------+---------------+---------+-----------+----------+--------------+ SFJ      Full                                                         +---------+---------------+---------+-----------+----------+--------------+ FV Prox  Full           Yes      Yes                                 +---------+---------------+---------+-----------+----------+--------------+ FV Mid   Full           Yes      Yes                                 +---------+---------------+---------+-----------+----------+--------------+ FV DistalFull           Yes      Yes                                 +---------+---------------+---------+-----------+----------+--------------+ PFV      Full                                                        +---------+---------------+---------+-----------+----------+--------------+  POP      Full           Yes      Yes                                 +---------+---------------+---------+-----------+----------+--------------+ PTV      Full                                                        +---------+---------------+---------+-----------+----------+--------------+ PERO     Full                                                        +---------+---------------+---------+-----------+----------+--------------+   +---------+---------------+---------+-----------+----------+--------------+ LEFT     CompressibilityPhasicitySpontaneityPropertiesThrombus Aging +---------+---------------+---------+-----------+----------+--------------+ CFV      Full           Yes      Yes                                 +---------+---------------+---------+-----------+----------+--------------+ SFJ      Full                                                        +---------+---------------+---------+-----------+----------+--------------+ FV Prox  Full           Yes      Yes                                 +---------+---------------+---------+-----------+----------+--------------+ FV Mid   Full           Yes      Yes                                  +---------+---------------+---------+-----------+----------+--------------+ FV DistalFull           Yes      Yes                                 +---------+---------------+---------+-----------+----------+--------------+ PFV      Full                                                        +---------+---------------+---------+-----------+----------+--------------+ POP      Full           Yes      Yes                                 +---------+---------------+---------+-----------+----------+--------------+ PTV  Full                                                        +---------+---------------+---------+-----------+----------+--------------+ PERO     Full                                                        +---------+---------------+---------+-----------+----------+--------------+     Summary: BILATERAL: - No evidence of deep vein thrombosis seen in the lower extremities, bilaterally. -No evidence of popliteal cyst, bilaterally. -Subcutaneous edema, bilaterally.  *See table(s) above for measurements and observations. Electronically signed by Delaney Fearing on 10/07/2023 at 11:38:15 AM.    Final     Recent Labs    10/06/23 0653  WBC 6.1  HGB 7.9*  HCT 25.5*  PLT 202   Recent Labs    10/06/23 0653 10/07/23 1141  NA 140 140  K 5.6* 5.2*  CL 112* 109  CO2 22 21*  GLUCOSE 102* 96  BUN 90* 83*  CREATININE 3.25* 2.99*  CALCIUM  7.2* 7.3*    Intake/Output Summary (Last 24 hours) at 10/08/2023 1013 Last data filed at 10/08/2023 0730 Gross per 24 hour  Intake 1172 ml  Output 400 ml  Net 772 ml         Physical Exam: Vital Signs Blood pressure (!) 142/95, pulse (!) 116, temperature 98.4 F (36.9 C), temperature source Oral, resp. rate 18, height 5\' 11"  (1.803 m), weight 74 kg, SpO2 100%.  Constitutional: No distress . Vital signs reviewed. HEENT: NCAT, EOMI, oral membranes moist Neck: supple Cardiovascular: sinus tach with murmur. No JVD     Respiratory/Chest: CTA Bilaterally without wheezes or rales. Normal effort    GI/Abdomen: BS +, non-tender, non-distended Ext: no clubbing, cyanosis, or edema Psych: pleasant and cooperative  Skin: No evidence of breakdown, no evidence of rash.  Facial scabs stable, healing. scattered abrasions on arms.--stablle Neurologic:  Awake, alert, and oriented x 4. 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 Cranial nerves II through XII intact, motor strength is 4-5-/5 throughout, no apparent sensory deficits. Prior neuro assessment is c/w today's exam 10/08/2023.  Musculoskeletal: Full range of motion in all 4 extremities. No joint swelling. No calf tenderness.     Assessment/Plan: 1. Functional deficits which require 3+ hours per day of interdisciplinary therapy in a comprehensive inpatient rehab setting. Physiatrist is providing close team supervision and 24 hour management of active medical problems listed below. Physiatrist and rehab team continue to assess barriers to discharge/monitor patient progress toward functional and medical goals  Care Tool:  Bathing    Body parts bathed by patient: Right arm, Left arm, Chest, Abdomen, Front perineal area, Right upper leg, Left upper leg, Right lower leg, Left lower leg, Face   Body parts bathed by helper: Front perineal area, Buttocks     Bathing assist Assist Level: Supervision/Verbal cueing     Upper Body Dressing/Undressing Upper body dressing   What is the patient wearing?: Pull over shirt    Upper body assist Assist Level: Set up assist    Lower Body Dressing/Undressing Lower body dressing      What is the patient wearing?: Underwear/pull up,  Pants     Lower body assist Assist for lower body dressing: Contact Guard/Touching assist     Toileting Toileting    Toileting assist Assist for toileting: Moderate Assistance - Patient 50 - 74%     Transfers Chair/bed transfer  Transfers assist     Chair/bed  transfer assist level: Contact Guard/Touching assist     Locomotion Ambulation   Ambulation assist      Assist level: Contact Guard/Touching assist Assistive device: Walker-rolling Max distance: 128   Walk 10 feet activity   Assist     Assist level: Contact Guard/Touching assist Assistive device: Walker-rolling   Walk 50 feet activity   Assist    Assist level: Contact Guard/Touching assist Assistive device: Walker-rolling    Walk 150 feet activity   Assist Walk 150 feet activity did not occur: Safety/medical concerns (fatigue)  Assist level: Contact Guard/Touching assist Assistive device: Walker-rolling    Walk 10 feet on uneven surface  activity   Assist     Assist level: Minimal Assistance - Patient > 75% Assistive device: Other (comment), Cane-quad (L handrail)   Wheelchair     Assist Is the patient using a wheelchair?: Yes Type of Wheelchair: Manual    Wheelchair assist level: Dependent - Patient 0% Max wheelchair distance: 186ft    Wheelchair 50 feet with 2 turns activity    Assist        Assist Level: Dependent - Patient 0%   Wheelchair 150 feet activity     Assist      Assist Level: Dependent - Patient 0%   Blood pressure (!) 142/95, pulse (!) 116, temperature 98.4 F (36.9 C), temperature source Oral, resp. rate 18, height 5\' 11"  (1.803 m), weight 74 kg, SpO2 100%.  Medical Problem List and Plan: 1. Functional deficits secondary to traumatic SAH fall             -patient may  shower             -ELOS/Goals: PT/OT/SLP Supervision, 10-12 days - 10/10/23 DC            -Continue CIR therapies including PT, OT   4/19: Poor adherence to LUE NWB, otherwise SPV OT. Low-120s HR with standing and walking. Some cog deficits c/b personality.     2.  Antithrombotics: -DVT/anticoagulation:  Mechanical: Antiembolism stockings, thigh (TED hose) Bilateral lower extremities.  Chronic Eliquis  currently on hold due to [facility] -dopplers  clear 5/2, start SCD's             -antiplatelet therapy: N/A  -4-29: With ongoing tachycardia and shortness of breath off of AC, need to rule out PE.  Discussed with cardiology and nephrology, will get D-dimer --> nuc med scan if positive.  Will also order bilateral lower extremity duplex  4/30; respiratory symptoms resolved, tachycardia improved with treatment of a flutter.  D-dimer was positive, however explained by comorbidities and with low suspicion for PE at this point; DC nuc med scan.    5/4 coordinate resumption of eliquis  with neurology 3. Pain Management: LidoDerm  patch, Robaxin  as needed, tramadol  as needed 4. Mood/Behavior/Sleep: Provide emotional support             -antipsychotic agents: Seroquel  25 mg nightly  -sleeping well 5. Neuropsych/cognition: This patient is capable of making decisions on his own behalf. 6. Skin/Wound Care: Routine skin checks 7. Fluids/Electrolytes/Nutrition: Routine ins and outs with follow-up chemistries  - 4-28: Very poor p.o. intakes over the weekend, 0% of meals and only  240 cc of p.o. intakes yesterday.  Will get nutrition consult to help evaluate today.  4-30: Eating a little better, at least 1 meal 100 %/day.  5-2: Nephrology adjusted to renal diet; patient endorsed this has been discussed with him in the past but he does not know what constitutes a renal diet.  Nutritional consult to assist with education today.  8.  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 Neurosurgery follow-up no current plan for surgical intervention however patient is currently maintained on Decadron  therapy for pain control.  - 5/2: Wean off Decadron  due to recurrent hyperkalemia, hypertension.  Given that patient has been on this medication more than 2 weeks, we will start wean over the next 6 days:   - 5 mg daily x2  days   - 2 mg daily x2 days   - 1 mg dailg x2 days   - stop  9.  Recurrent large right pleural effusion.  Status post ultrasound thoracentesis 1.6 L fluid removed.  - 4-28: Chest x-ray today with recurrence, IR tapped for 825 cc of dark amber fluid.  Follow-up chest x-ray improved.   4/29: CXR for ongoing crackles and opacity yesterday - showing recurrent pleural effusion, opacity from yesterday not well-visualized.  Per cardiology, effusion unlikely due to CHF and would avoid IV diuretics due to renal disease; will need to drain again with recurrence.   5-1: Per [name], request pulmonary consult/referral prior to discharge and, if effusion is retapped, flow cytometry testing.  Will get repeat chest x-ray in a.m. for monitoring, otherwise seems stable from a respiratory standpoint at this point so no emergent tap.  5/2: Chest x-ray this morning with stable, small right pleural effusion and right basilar atelectasis.  Add incentive spirometry.  Respiratory status stable, no indication for tap at this time.   -5/3-4 arrange follow up with pulmonary as outpt   10.  PAF.  Rate controlled.  Continue amiodarone .  Eliquis  on hold due to [facility].  - 4-28: rergular tachycardia today, c/b anemia and pleural effusion.  Treat causative sources first. - 4.29: remains tachycardic,  regular rhythm. EKG showing atrial flutter.  Added metoprolol  12.5 twice daily, cardiology consulted, appreciate recommendations. 4-30: Rate improved, patient feeling much better.  Cardiology increased metoprolol  to 25 mg twice daily.  Also increased amiodarone .  Appreciate their insight and recommendations. 5-3: Heart rate still elevated at times but improved. Reg rhythm this morning.  Appreciate cardiology assistance.  11.  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  Follow-up nephrology services.  Renal ultrasound no hydronephrosis.  Currently no plans for HD. Avoid nephrotoxins. Monitor labs. Maintain MAP > 65  - 4-28: Creatinine down to 3.6  from prior 4.5; BUN is increasing.  Hyperkalemic status post transfusion today.  Has been on continuous IVF--Dced today   - 4/29: Per nephro, if contrast needed, avoid ACE/ARB  12.  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  Follow-up orthopedic services Dr. Abigail Abler.    13.  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  Followed by oncology services [name].  Prior to admission patient was receiving Revlimid  5 mg mouth daily for 21 days then hold for 7 days repeating every 28 days  4/28: Hemoglobin steadily downtrending to 7.1 today; given persistent tachycardia and symptoms, will transfuse 1 unit CMV negative today and follow with 40 mg IV Lasix .  4/29: HgB improved  8.9; monitor  4/30: Down to 7.8 again, WBC down to 3.6. H/H, FOBT ordered. --H&H 8.5, unchanged  5/1: HgB 7.5, WBC stable 3.8. FOBT negative.  Spoke with oncology on-call and [name].  Per [name], no need for daily monitoring, expect patient to continue to drop his hemoglobin, would transfuse once just prior to discharge and follow-up with her shortly after; already scheduled.   5/4 pt hgb is 7.9 but he's symptomatic with a history of PAF. HR is elevated today   -will go ahead and transfuse 1u PRBC as opposed to waiting until tomorrow.  14.  Hypothyroidism.  Synthroid  15.  Hyperlipidemia.  Zocor  16.  History of BPH.  Check PVR--no retention per documentation. 17.  GERD.  Protonix  18.  Hypotension/hypertension.  Followed by renal services with plan to slowly wean ProAmatine . Vitals:   10/08/23 0744 10/08/23 0812  BP: (!) 154/99 (!) 142/95  Pulse: (!) 113 (!) 116  Resp:  18  Temp:  98.4 F (36.9 C)  SpO2:  100%   Supine systolic BPs mildly elevated will order ortho vitals to see if midodrine  can be reduced  -4/28: Severe hypertension overnight.  Reduce midodrine  to 5 mg twice daily at 8 AM and noon, get orthostatic vitals today. Encourage PO fluids--remained hypertensive today, decrease  midodrine  to 2.5 mg twice daily tomorrow a.m.  - 4/29: Remains elevated, DC midodrine .  - 4-30: Normotensive off of midodrine , monitor 5/2:  elevated, will start weaning dexamethasone  as above.  Add hydralazine  10 mg as needed for systolic greater than 170, diastolic greater than 110. -5/3 persistent elevation. Steroids being weaned.  -given HR issues, will increase metoprolol  to 37.5mg  bid 5/4 increase metoprolol  to 50mg  bid 19. Occasional cough, reports started couple hours ago             -Start robitussin              -Repeat Xray chest if does not improve  - 4-28: Chest x-ray today with pleural effusion, tapped as above.  Remaining right middle lobe opacity.  No fevers, WBC stable.  Monitor respiratory status following effusion tap.   - 4.29: see above; remains afebrile, WBC stable - monitor  5-2: Incentive spirometry as above  20. Leukopenia- resolved             -Oncology s/p  GCSF, CBC daily    Latest Ref Rng & Units 10/06/2023    6:53 AM 10/05/2023    5:49 AM 10/04/2023   10:03 AM  CBC  WBC 4.0 - 10.5 K/uL 6.1  3.8    Hemoglobin 13.0 - 17.0 g/dL 7.9  7.5  8.5   Hematocrit 39.0 - 52.0 % 25.5  23.8  27.9   Platelets 150 - 400 K/uL 202  194      - 4-30: Recurrent.  Continue daily CBC.--stable  5-2: Per [name], no need for daily monitoring, will monitor q. Monday/Thursday  21. L knee OA             -Xray nov 2024 indicates moderate OA             -Muscle rub prn     22.  Left AC joint disruption - Nonweightbearing left upper extremity with shoulder sling for comfort.    - 4/29: Per ortho OK for WBAT; order changed  23.  Hyperkalemia.  5.8.  Likely secondary to blood transfusion today, already got 40 mg of IV Lasix  for treatment.  Repeat BMP in a.m., continue  IV fluids.  - Ensure changed to Nepro  - 4/29: Cr stable, K improved 5.3. Will give Lokelma  to avoid diuretics. Repeat in AM. EKG w/o T wave changes  4-30: K remains 5.3, no bowel movement yesterday.  Will give another  dose Lokelma  5 mg x 1, and increase bowel regimen for effect.  5/1: BM yesterday, BMP pending--resolved, 5.1.  5/2: Multiple bowel movements overnight, potassium up to 5.6.  DC Decadron  as above.  Nephrology to evaluate today, appreciate their recommendations and assistance.--Ordered Lokelma  10 mg  5/3 appreciate renal rec. Changed to renal diet. 24. Diarrhea, acute on chronic.   5-2: Up last night with diarrhea, likely secondary to Lokelma  and increased bowel regimen.  DC MiraLAX , added as needed Imodium .  5/4 improving. Last bm 5/2. Imodium  prn 9 days A FACE TO FACE EVALUATION WAS PERFORMED  Rawland Caddy 10/08/2023, 10:13 AM

## 2023-10-08 NOTE — Progress Notes (Signed)
   10/08/23 1115  Assess: MEWS Score  Temp 98 F (36.7 C)  BP (!) 129/98  Pulse Rate (!) 116 (apical)  Resp (!) 22  Level of Consciousness Alert  SpO2 97 %  O2 Device Room Air  Assess: MEWS Score  MEWS Temp 0  MEWS Systolic 0  MEWS Pulse 2  MEWS RR 1  MEWS LOC 0  MEWS Score 3  MEWS Score Color Yellow  Assess: if the MEWS score is Yellow or Red  Were vital signs accurate and taken at a resting state? Yes  Does the patient meet 2 or more of the SIRS criteria? No  MEWS guidelines implemented  No, previously yellow, continue vital signs every 4 hours  Provider Notification  Provider Name/Title Dr Rachel Budds  Date Provider Notified 10/08/23  Time Provider Notified 1100  Method of Notification Call  Notification Reason Change in status  Provider response No new orders;Other (Comment) (continue eval and expodite admin blood transfusion)  Date of Provider Response 10/08/23  Time of Provider Response 1100  Assess: SIRS CRITERIA  SIRS Temperature  0  SIRS Respirations  1  SIRS Pulse 1  SIRS WBC 0  SIRS Score Sum  2

## 2023-10-08 NOTE — Progress Notes (Signed)
 Nephrology Follow-Up Consult note   Assessment/Recommendations: [name] is a/an 83 y.o. 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    AKI on CKD IV w/ hyperkalemia: Baseline creatinine around 3-4 and seems to fluctuate but has been stable.  Followed by Dr. Zelda Hickman ([facility]) in the outpatient setting and starting to initiate plans to prepare for dialysis.  Exposures including contrast and vancomycin .  Creatinine is relatively stable time.   Patient is on a regular diet; I changed it to a renal diet on 5/1.  Patient had been eating bananas + potato soup (if the potato is cooked in the soup has high potassium), [place] fries.  Pineapples are generally lower in potassium that should be fine, he is eating that every single morning.  Lokelma  was causing diarrhea to worsen; trying to control primarily with diet; changed to renal diet and also educated the patient on foods to avoid.    Lokelma  5gm MWF; signing off at this time; please reconsult as needed. D/c if K low but he may need it chronically.  Trying to hold off on giving 10 g or daily doses as it does cause diarrhea.   -Continue to monitor daily Cr, Dose meds for GFR -Monitor Daily I/Os, Daily weight  -Maintain MAP>65 for optimal renal perfusion.  -Avoid nephrotoxic medications including NSAIDs -Use synthetic opioids (Fentanyl /Dilaudid ) if needed -Currently no indication for HD   Hyperkalemia Now improved (5.2).  Continue to monitor; see above   Hypotension Resolved, now normotensive on Midodrine .  Would start weaning this medication if blood pressures consistently greater than 130 and/or his kidney function starts to improve   Anemia due to CKD: Hgb 7.9, stable with prior days.  Transfusions per primary team   Fall - SAH/neck pain/shoulder pain  Per Primary and NSGY   R pleural effusion S/p thoracentesis of 1.6L,  consistent with exudative effusion. Appears recurrent given prior thoracentesis in 05/2023. Defer to Primary.   Leukopenia: Defer to primary  Metabolic acidosis: Continue sodium bicarbonate  as ordered (already increased to 2 tablets 3 times daily) ___________________________________________________________  CC: Fall  Interval History/Subjective:  Patient diarrhea better; feels weak; appetite still not great but he really likes pineapples in the morning.   Medications:  Current Facility-Administered Medications  Medication Dose Route Frequency Provider Last Rate Last Admin   0.9 %  sodium chloride  infusion (Manually program via Guardrails IV Fluids)   Intravenous Once Cherri Corns C, DO       acetaminophen  (TYLENOL ) tablet 500 mg  500 mg Oral Q6H PRN Angiulli, Daniel J, PA-C       Or   acetaminophen  (TYLENOL ) suppository 650 mg  650 mg Rectal Q6H PRN Angiulli, Everlyn Hockey, PA-C       amiodarone  (PACERONE ) tablet 200 mg  200 mg Oral Daily Angiulli, Daniel J, PA-C   200 mg at 10/08/23 0746   [START ON 10/09/2023] dexamethasone  (DECADRON ) tablet 2 mg  2 mg Oral Daily Engler, Morgan C, DO       Followed by   [name] ON 10/11/2023] dexamethasone  (DECADRON ) tablet 1 mg  1 mg Oral Daily Cherri Corns C, DO       DULoxetine  (CYMBALTA ) DR capsule 20 mg  20 mg Oral QHS AngiulliEverlyn Hockey, PA-C   20 mg at 10/07/23 2024   feeding supplement (NEPRO CARB STEADY) liquid 237 mL  237 mL Oral TID WC Love, Pamela S, PA-C  237 mL at 10/07/23 1257   guaiFENesin -dextromethorphan  (ROBITUSSIN DM) 100-10 MG/5ML syrup 5 mL  5 mL Oral Q4H PRN Lylia Sand, MD   5 mL at 10/01/23 1833   hydrALAZINE  (APRESOLINE ) tablet 10 mg  10 mg Oral Q6H PRN Engler, Morgan C, DO       levothyroxine  (SYNTHROID ) tablet 100 mcg  100 mcg Oral QAC breakfast Sterling Eisenmenger, PA-C   100 mcg at 10/08/23 0530   lidocaine  (LIDODERM ) 5 % 1 patch  1 patch Transdermal Q24H Angiulli, Daniel J, PA-C   1 patch at 10/07/23 1750   loperamide   (IMODIUM ) capsule 2 mg  2 mg Oral PRN Engler, Morgan C, DO   2 mg at 10/07/23 2024   melatonin tablet 3 mg  3 mg Oral QHS PRN Sterling Eisenmenger, PA-C   3 mg at 10/05/23 2313   methocarbamol  (ROBAXIN ) tablet 500 mg  500 mg Oral Q8H PRN Angiulli, Daniel J, PA-C       metoprolol  tartrate (LOPRESSOR ) tablet 37.5 mg  37.5 mg Oral BID Swartz, Zachary T, MD   37.5 mg at 10/08/23 0744   multivitamin with minerals tablet 1 tablet  1 tablet Oral Daily Angiulli, Daniel J, PA-C   1 tablet at 10/08/23 0745   Muscle Rub CREA   Topical PRN Lylia Sand, MD       ondansetron  (ZOFRAN ) tablet 4 mg  4 mg Oral Q6H PRN Angiulli, Daniel J, PA-C       Or   ondansetron  (ZOFRAN ) injection 4 mg  4 mg Intravenous Q6H PRN Angiulli, Daniel J, PA-C       pantoprazole  (PROTONIX ) EC tablet 40 mg  40 mg Oral Daily Angiulli, Daniel J, PA-C   40 mg at 10/08/23 0746   QUEtiapine  (SEROQUEL ) tablet 25 mg  25 mg Oral QHS PRN Engler, Morgan C, DO   25 mg at 10/07/23 2025   senna-docusate (Senokot-S) tablet 2 tablet  2 tablet Oral BID Cherri Corns C, DO   2 tablet at 10/08/23 0745   simvastatin  (ZOCOR ) tablet 40 mg  40 mg Oral QHS Angiulli, Daniel J, PA-C   40 mg at 10/07/23 2024   sodium bicarbonate  tablet 1,300 mg  1,300 mg Oral TID Sterling Eisenmenger, PA-C   1,300 mg at 10/08/23 1610   sorbitol  70 % solution 30 mL  30 mL Oral Daily PRN Angiulli, Daniel J, PA-C       traMADol  (ULTRAM ) tablet 50 mg  50 mg Oral Q12H PRN Angiulli, Daniel J, PA-C       Facility-Administered Medications Ordered in Other Encounters  Medication Dose Route Frequency Provider Last Rate Last Admin   epoetin  alfa-epbx (RETACRIT ) injection 10,000 Units  10,000 Units Subcutaneous Once Rao, Archana C, MD       epoetin  alfa-epbx (RETACRIT ) injection 40,000 Units  40,000 Units Subcutaneous Once Avonne Boettcher, MD          Review of Systems: 10 systems reviewed and negative except per interval history/subjective  Physical Exam: Vitals:   10/08/23 0448  10/08/23 0744  BP: (!) 140/100 (!) 154/99  Pulse: (!) 110 (!) 113  Resp: 18   Temp: 98.6 F (37 C)   SpO2: 98%    No intake/output data recorded.  Intake/Output Summary (Last 24 hours) at 10/08/2023 0758 Last data filed at 10/07/2023 1743 Gross per 24 hour  Intake 736 ml  Output 525 ml  Net 211 ml   Constitutional: Lying in bed, no distress ENMT: ears and  nose without scars or lesions, MMM CV: normal rate, no edema Respiratory: Bilateral chest rise, normal work of breathing Gastrointestinal: soft, nondistended Skin: no visible lesions or rashes Psych: alert, judgement/insight appropriate, appropriate mood and affect   Test Results I personally reviewed new and old clinical labs and radiology tests Lab Results  Component Value Date   NA 140 10/07/2023   K 5.2 (H) 10/07/2023   CL 109 10/07/2023   CO2 21 (L) 10/07/2023   BUN 83 (H) 10/07/2023   CREATININE 2.99 (H) 10/07/2023   CALCIUM  7.3 (L) 10/07/2023   ALBUMIN  2.1 (L) 09/30/2023   PHOS 5.4 (H) 09/29/2023    CBC Recent Labs  Lab 10/04/23 0513 10/04/23 1003 10/05/23 0549 10/06/23 0653  WBC 3.6*  --  3.8* 6.1  NEUTROABS 2.6  --  2.0 3.2  HGB 7.8* 8.5* 7.5* 7.9*  HCT 25.1* 27.9* 23.8* 25.5*  MCV 90.0  --  90.2 91.7  PLT 217  --  194 202

## 2023-10-09 ENCOUNTER — Encounter: Payer: Self-pay | Admitting: Oncology

## 2023-10-09 ENCOUNTER — Telehealth (HOSPITAL_COMMUNITY): Payer: Self-pay | Admitting: Pharmacy Technician

## 2023-10-09 ENCOUNTER — Other Ambulatory Visit (HOSPITAL_COMMUNITY): Payer: Self-pay

## 2023-10-09 DIAGNOSIS — G3184 Mild cognitive impairment, so stated: Secondary | ICD-10-CM

## 2023-10-09 DIAGNOSIS — S066X0S Traumatic subarachnoid hemorrhage without loss of consciousness, sequela: Secondary | ICD-10-CM

## 2023-10-09 DIAGNOSIS — I4819 Other persistent atrial fibrillation: Secondary | ICD-10-CM

## 2023-10-09 LAB — BPAM RBC
Blood Product Expiration Date: 202505312359
ISSUE DATE / TIME: 202505041421
Unit Type and Rh: 5100

## 2023-10-09 LAB — BASIC METABOLIC PANEL WITH GFR
Anion gap: 10 (ref 5–15)
BUN: 78 mg/dL — ABNORMAL HIGH (ref 8–23)
CO2: 21 mmol/L — ABNORMAL LOW (ref 22–32)
Calcium: 7 mg/dL — ABNORMAL LOW (ref 8.9–10.3)
Chloride: 109 mmol/L (ref 98–111)
Creatinine, Ser: 3.09 mg/dL — ABNORMAL HIGH (ref 0.61–1.24)
GFR, Estimated: 19 mL/min — ABNORMAL LOW (ref >60)
Glucose, Bld: 88 mg/dL (ref 70–99)
Potassium: 5.2 mmol/L — ABNORMAL HIGH (ref 3.5–5.1)
Sodium: 140 mmol/L (ref 135–145)

## 2023-10-09 LAB — TYPE AND SCREEN
ABO/RH(D): O POS
Antibody Screen: NEGATIVE
Unit division: 0

## 2023-10-09 LAB — CBC WITH DIFFERENTIAL/PLATELET
Abs Immature Granulocytes: 0.5 10*3/uL — ABNORMAL HIGH (ref 0.00–0.07)
Basophils Absolute: 0 10*3/uL (ref 0.0–0.1)
Basophils Relative: 0 %
Eosinophils Absolute: 0.1 10*3/uL (ref 0.0–0.5)
Eosinophils Relative: 1 %
HCT: 28.9 % — ABNORMAL LOW (ref 39.0–52.0)
Hemoglobin: 9.1 g/dL — ABNORMAL LOW (ref 13.0–17.0)
Lymphocytes Relative: 12 %
Lymphs Abs: 0.9 10*3/uL (ref 0.7–4.0)
MCH: 28.4 pg (ref 26.0–34.0)
MCHC: 31.5 g/dL (ref 30.0–36.0)
MCV: 90.3 fL (ref 80.0–100.0)
Metamyelocytes Relative: 4 %
Monocytes Absolute: 0.2 10*3/uL (ref 0.1–1.0)
Monocytes Relative: 3 %
Myelocytes: 3 %
Neutro Abs: 5.9 10*3/uL (ref 1.7–7.7)
Neutrophils Relative %: 77 %
Platelets: 193 10*3/uL (ref 150–400)
RBC: 3.2 MIL/uL — ABNORMAL LOW (ref 4.22–5.81)
RDW: 18.2 % — ABNORMAL HIGH (ref 11.5–15.5)
WBC: 7.6 10*3/uL (ref 4.0–10.5)
nRBC: 0.3 % — ABNORMAL HIGH (ref 0.0–0.2)
nRBC: 1 /100{WBCs} — ABNORMAL HIGH

## 2023-10-09 MED ORDER — DULOXETINE HCL 20 MG PO CPEP
20.0000 mg | ORAL_CAPSULE | Freq: Every day | ORAL | 0 refills | Status: DC
  Filled 2023-10-09: qty 30, 30d supply, fill #0

## 2023-10-09 MED ORDER — SODIUM BICARBONATE 650 MG PO TABS
1300.0000 mg | ORAL_TABLET | Freq: Three times a day (TID) | ORAL | 0 refills | Status: DC
  Filled 2023-10-09: qty 180, 30d supply, fill #0

## 2023-10-09 MED ORDER — TRAMADOL HCL 50 MG PO TABS
50.0000 mg | ORAL_TABLET | Freq: Two times a day (BID) | ORAL | 0 refills | Status: AC | PRN
  Filled 2023-10-09: qty 30, 15d supply, fill #0

## 2023-10-09 MED ORDER — CYANOCOBALAMIN 1000 MCG PO TABS
1000.0000 ug | ORAL_TABLET | Freq: Every day | ORAL | 0 refills | Status: AC
  Filled 2023-10-09: qty 30, 30d supply, fill #0

## 2023-10-09 MED ORDER — QUETIAPINE FUMARATE 25 MG PO TABS
25.0000 mg | ORAL_TABLET | Freq: Every evening | ORAL | 0 refills | Status: DC | PRN
  Filled 2023-10-09: qty 10, 10d supply, fill #0

## 2023-10-09 MED ORDER — METOPROLOL TARTRATE 50 MG PO TABS
50.0000 mg | ORAL_TABLET | Freq: Two times a day (BID) | ORAL | 0 refills | Status: DC
  Filled 2023-10-09: qty 60, 30d supply, fill #0

## 2023-10-09 MED ORDER — AMIODARONE HCL 200 MG PO TABS
200.0000 mg | ORAL_TABLET | Freq: Every day | ORAL | 0 refills | Status: AC
  Filled 2023-10-09: qty 30, 30d supply, fill #0

## 2023-10-09 MED ORDER — LEVOTHYROXINE SODIUM 100 MCG PO TABS
100.0000 ug | ORAL_TABLET | Freq: Every day | ORAL | 0 refills | Status: DC
  Filled 2023-10-09: qty 30, 30d supply, fill #0

## 2023-10-09 MED ORDER — SIMVASTATIN 40 MG PO TABS
40.0000 mg | ORAL_TABLET | Freq: Every day | ORAL | 0 refills | Status: DC
  Filled 2023-10-09: qty 30, 30d supply, fill #0

## 2023-10-09 MED ORDER — PANTOPRAZOLE SODIUM 40 MG PO TBEC
40.0000 mg | DELAYED_RELEASE_TABLET | Freq: Every day | ORAL | 0 refills | Status: DC
  Filled 2023-10-09: qty 30, 30d supply, fill #0

## 2023-10-09 MED ORDER — LIDOCAINE 5 % EX PTCH
1.0000 | MEDICATED_PATCH | CUTANEOUS | 0 refills | Status: DC
  Filled 2023-10-09: qty 30, 30d supply, fill #0

## 2023-10-09 NOTE — Progress Notes (Signed)
 Physical Therapy Discharge Summary  Patient Details  Name: [name] MRN: [id number] Date of Birth: Jan 03, 1941  Date of Discharge from PT service:Oct 09, 2023  Today's Date: 10/09/2023 PT Individual Time: 0902-0959 PT Individual Time Calculation (min): 57 min    Patient has met 8 of 8 long term goals due to improved activity tolerance, improved balance, improved postural control, increased strength, improved attention, improved awareness, and improved coordination.  Patient to discharge at an ambulatory level Supervision.   Patient's care partner is independent to provide the necessary physical assistance at discharge.  Reasons goals not met: NA  Recommendation:  Patient will benefit from ongoing skilled PT services in outpatient setting to continue to advance safe functional mobility, address ongoing impairments in balance, endurance, ambulation, transfers, and minimize fall risk.  Equipment: No equipment provided  Reasons for discharge: treatment goals met and discharge from hospital  Patient/family agrees with progress made and goals achieved: Yes  Skilled Therapeutic Interventions: Pt received supine in bed asleep. Reports not feeling particularly well this morning, saying he is anxious about having to receive another blood transfusing and reporting that he did not sleep much overnight. Pt does not report any pain. Pt requires increased time for all mobility secondary to fatigue and feeling light headed. Pt performs ambulatory transfer from bed to [facility] with RW and cues for positioning for safety. WC transport to gym. Pt completes ramp navigation and car transfer with RW and cues for sequencing. Following extended seated rest break, pt ambulates x175' with RW and cues for upright gaze to improve posture and balance, and increasing stride length to decrease risk for falls. Seated rest break. Pt completes x12 6" steps with bilateral handrails and cues for step sequencing and placement.  Following rest break, pt stand with use of RW and picks ojbect off floor with cues for hand placement for safety. WC transport back to room. Left seated in WC with all needs within reach.   PT Discharge Precautions/Restrictions Precautions Precautions: Fall Restrictions Weight Bearing Restrictions Per Provider Order: Yes LUE Weight Bearing Per Provider Order: Weight bearing as tolerated Pain Interference Pain Interference Pain Effect on Sleep: 1. Rarely or not at all Pain Interference with Therapy Activities: 1. Rarely or not at all Pain Interference with Day-to-Day Activities: 1. Rarely or not at all Vision/Perception  Vision - History Ability to See in Adequate Light: 0 Adequate Perception Perception: Within Functional Limits Praxis Praxis: WFL  Cognition Overall Cognitive Status: Within Functional Limits for tasks assessed Arousal/Alertness: Awake/alert Selective Attention: Appears intact Memory: Appears intact Awareness: Appears intact Problem Solving: Appears intact Organizing: Appears intact Decision Making: Appears intact Safety/Judgment: Appears intact [employer] Scales of Cognitive Functioning: Purposeful, Appropriate: Modified Independent Sensation Sensation Light Touch: Appears Intact Coordination Gross Motor Movements are Fluid and Coordinated: No Fine Motor Movements are Fluid and Coordinated: No Coordination and Movement Description: Generalized weakness/deconditioning. Improved from eval. Heel Shin Test: [facility] bilaterally Motor  Motor Motor: Within Functional Limits  Mobility Bed Mobility Bed Mobility: Supine to Sit;Sit to Supine Supine to Sit: Independent with assistive device Sit to Supine: Independent with assistive device Transfers Transfers: Sit to Stand;Stand to Sit;Stand Pivot Transfers Sit to Stand: Independent with assistive device Stand to Sit: Independent with assistive device Stand Pivot Transfers: Independent with assistive  device Transfer (Assistive device): Rolling walker Locomotion  Gait Ambulation: Yes Gait Assistance: Supervision/Verbal cueing Gait Distance (Feet): 175 Feet Assistive device: Rolling walker Gait Assistance Details: Verbal cues for safe use of DME/AE;Verbal cues  for sequencing;Verbal cues for gait pattern;Verbal cues for technique Gait Gait: Yes Gait Pattern: Impaired Gait Pattern: Decreased stride length Gait velocity: decreased Stairs / Additional Locomotion Stairs: Yes Stairs Assistance: Supervision/Verbal cueing Stair Management Technique: Two rails;Forwards;Step to pattern Number of Stairs: 12 Height of Stairs: 6 Ramp: Supervision/Verbal cueing Curb: Supervision/Verbal cueing Wheelchair Mobility Wheelchair Mobility: No  Trunk/Postural Assessment  Cervical Assessment Cervical Assessment: Exceptions to [facility] (forward head) Thoracic Assessment Thoracic Assessment: Within Functional Limits Lumbar Assessment Lumbar Assessment: Within Functional Limits Postural Control Postural Control: Deficits on evaluation Righting Reactions: delayed  Balance Balance Balance Assessed: Yes Static Sitting Balance Static Sitting - Balance Support: Feet supported;Bilateral upper extremity supported Static Sitting - Level of Assistance: 6: Modified independent (Device/Increase time) Dynamic Sitting Balance Dynamic Sitting - Balance Support: Feet supported;No upper extremity supported Dynamic Sitting - Level of Assistance: 6: Modified independent (Device/Increase time) Static Standing Balance Static Standing - Balance Support: During functional activity;Bilateral upper extremity supported Static Standing - Level of Assistance: 6: Modified independent (Device/Increase time) Dynamic Standing Balance Dynamic Standing - Balance Support: During functional activity;Bilateral upper extremity supported Dynamic Standing - Level of Assistance: 5: Stand by assistance Extremity Assessment  RLE  Strength RLE Overall Strength Comments: Grossly 4/5 LLE Assessment LLE Assessment: Exceptions to [facility] LLE Strength LLE Overall Strength Comments: Grossly 4/5   Neva Barban, PT, DPT 10/09/2023, 4:16 PM

## 2023-10-09 NOTE — Progress Notes (Addendum)
 PROGRESS NOTE   Subjective/Complaints:  Feels ok , reviewed labs Elevated K+ Lokelma  scheduled for today   ROS: Patient denies CP, SOB, N/V/D  Objective:   No results found.   No results for input(s): "WBC", "HGB", "HCT", "PLT" in the last 72 hours.  Recent Labs    10/07/23 1141  NA 140  K 5.2*  CL 109  CO2 21*  GLUCOSE 96  BUN 83*  CREATININE 2.99*  CALCIUM  7.3*    Intake/Output Summary (Last 24 hours) at 10/09/2023 0819 Last data filed at 10/09/2023 0800 Gross per 24 hour  Intake 798 ml  Output 720 ml  Net 78 ml         Physical Exam: Vital Signs Blood pressure (!) 145/99, pulse (!) 109, temperature 97.7 F (36.5 C), resp. rate 18, height 5\' 11"  (1.803 m), weight 74.3 kg, SpO2 96%.   General: No acute distress Mood and affect are appropriate Heart: Regular rate and rhythm no rubs murmurs or extra sounds Lungs: Clear to auscultation, breathing unlabored, no rales or wheezes Abdomen: Positive bowel sounds, soft nontender to palpation, nondistended Extremities: No clubbing, cyanosis, or edema Skin: No evidence of breakdown, no evidence of rash Neurologic:   Cranial nerves II through XII intact, motor strength is 4-5-/5 throughout, no apparent sensory deficits. Prior neuro assessment is c/w today's exam 10/09/2023.  Musculoskeletal: Full range of motion in all 4 extremities. No joint swelling. No calf tenderness.     Assessment/Plan: 1. Functional deficits which require 3+ hours per day of interdisciplinary therapy in a comprehensive inpatient rehab setting. Physiatrist is providing close team supervision and 24 hour management of active medical problems listed below. Physiatrist and rehab team continue to assess barriers to discharge/monitor patient progress toward functional and medical goals  Care Tool:  Bathing    Body parts bathed by patient: Right arm, Left arm, Chest, Abdomen, Front perineal  area, Right upper leg, Left upper leg, Right lower leg, Left lower leg, Face   Body parts bathed by helper: Front perineal area, Buttocks     Bathing assist Assist Level: Supervision/Verbal cueing     Upper Body Dressing/Undressing Upper body dressing   What is the patient wearing?: Pull over shirt    Upper body assist Assist Level: Set up assist    Lower Body Dressing/Undressing Lower body dressing      What is the patient wearing?: Underwear/pull up, Pants     Lower body assist Assist for lower body dressing: Contact Guard/Touching assist     Toileting Toileting    Toileting assist Assist for toileting: Moderate Assistance - Patient 50 - 74%     Transfers Chair/bed transfer  Transfers assist     Chair/bed transfer assist level: Contact Guard/Touching assist     Locomotion Ambulation   Ambulation assist      Assist level: Contact Guard/Touching assist Assistive device: Walker-rolling Max distance: 128   Walk 10 feet activity   Assist     Assist level: Contact Guard/Touching assist Assistive device: Walker-rolling   Walk 50 feet activity   Assist    Assist level: Contact Guard/Touching assist Assistive device: Walker-rolling    Walk 150 feet activity  Assist Walk 150 feet activity did not occur: Safety/medical concerns (fatigue)  Assist level: Contact Guard/Touching assist Assistive device: Walker-rolling    Walk 10 feet on uneven surface  activity   Assist     Assist level: Minimal Assistance - Patient > 75% Assistive device: Other (comment), Cane-quad (L handrail)   Wheelchair     Assist Is the patient using a wheelchair?: Yes Type of Wheelchair: Manual    Wheelchair assist level: Dependent - Patient 0% Max wheelchair distance: 184ft    Wheelchair 50 feet with 2 turns activity    Assist        Assist Level: Dependent - Patient 0%   Wheelchair 150 feet activity     Assist      Assist Level:  Dependent - Patient 0%   Blood pressure (!) 145/99, pulse (!) 109, temperature 97.7 F (36.5 C), resp. rate 18, height 5\' 11"  (1.803 m), weight 74.3 kg, SpO2 96%.  Medical Problem List and Plan: 1. Functional deficits secondary to traumatic SAH fall 4/17             -patient may  shower             -ELOS/Goals: PT/OT/SLP Supervision, 10-12 days - 10/10/23 DC            -Continue CIR therapies including PT, OT       2.  Antithrombotics: -DVT/anticoagulation:  Mechanical: Antiembolism stockings, thigh (TED hose) Bilateral lower extremities.  Chronic Eliquis  currently on hold due to [facility]- also pt still with elevated fall risk  -dopplers clear 5/2, start SCD's             -antiplatelet therapy: N/A  -4-29: With ongoing tachycardia and shortness of breath off of AC, need to rule out PE.  Discussed with cardiology and nephrology, will get D-dimer --> nuc med scan if positive.  Will also order bilateral lower extremity duplex  4/30; respiratory symptoms resolved, tachycardia improved with treatment of a flutter.  D-dimer was positive, however explained by comorbidities and with low suspicion for PE at this point; DC nuc med scan.    5/4 coordinate resumption of eliquis  with neurology vs neurosurgery since this was traumatic, lives in [place], May see Dr Felipe Horton or Jeris Montes , typical resumption of anticoag is  4-8wks post SAH 3. Pain Management: LidoDerm  patch, Robaxin  as needed, tramadol  as needed 4. Mood/Behavior/Sleep: Provide emotional support             -antipsychotic agents: Seroquel  25 mg nightly  -sleeping well 5. Neuropsych/cognition: This patient is capable of making decisions on his own behalf. 6. Skin/Wound Care: Routine skin checks 7. Fluids/Electrolytes/Nutrition: Routine ins and outs with follow-up chemistries  - 4-28: Very poor p.o. intakes over the weekend, 0% of meals and only 240 cc of p.o. intakes yesterday.  Will get nutrition consult to help evaluate today.  4-30: Eating a  little better, at least 1 meal 100 %/day.  5-2: Nephrology adjusted to renal diet; patient endorsed this has been discussed with him in the past but he does not know what constitutes a renal diet.  Nutritional consult to assist with education today.  8.  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 Neurosurgery follow-up no current plan for surgical intervention however patient is currently maintained on Decadron  therapy for pain control.  - 5/2: Wean off Decadron  due to recurrent hyperkalemia,  hypertension.  Given that patient has been on this medication more than 2 weeks, we will start wean over the next 6 days:   - 5 mg daily x2 days   - 2 mg daily x2 days   - 1 mg dailg x2 days   - stop  9.  Recurrent large right pleural effusion.  Status post ultrasound thoracentesis 1.6 L fluid removed.  - 4-28: Chest x-ray today with recurrence, IR tapped for 825 cc of dark amber fluid.  Follow-up chest x-ray improved.    5/2: Chest x-ray this morning with stable, small right pleural effusion and right basilar atelectasis.  Add incentive spirometry.  Respiratory status stable, no indication for tap at this time.   -5/3-4 arrange follow up with pulmonary as outpt   10.  PAF.  Rate controlled.  Continue amiodarone .  Eliquis  on hold due to [facility].  - 4-28: rergular tachycardia today, c/b anemia and pleural effusion.  Treat causative sources first. - 4.29: remains tachycardic,  regular rhythm. EKG showing atrial flutter.  Added metoprolol  12.5 twice daily, cardiology consulted, appreciate recommendations. 4-30: Rate improved, patient feeling much better.  Cardiology increased metoprolol  to 25 mg twice daily.  Also increased amiodarone .  Appreciate their insight and recommendations. 5-3: Heart rate still elevated at times but improved. Reg rhythm this morning.   Appreciate cardiology assistance.  11.  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  Follow-up nephrology services.  Renal ultrasound no hydronephrosis.  Currently no plans for HD. Avoid nephrotoxins. Monitor labs. Maintain MAP > 65  - 4-28: Creatinine down to 3.6 from prior 4.5; BUN is increasing.  Hyperkalemic status post transfusion today.  Has been on continuous IVF--Dced today   - 4/29: Per nephro, if contrast needed, avoid ACE/ARB  12.  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  Follow-up orthopedic services Dr. Abigail Abler.    13.  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  Followed by oncology services [name].  Prior to admission patient was receiving Revlimid  5 mg mouth daily for 21 days then hold for 7 days repeating every 28 days  4/28: Hemoglobin steadily downtrending to 7.1 today; given persistent tachycardia and symptoms, will transfuse 1 unit CMV negative today and follow with 40 mg IV Lasix .  4/29: HgB improved 8.9; monitor  4/30: Down to 7.8 again, WBC down to 3.6. H/H, FOBT ordered. --H&H 8.5, unchanged  5/1: HgB 7.5, WBC stable 3.8. FOBT negative.  Spoke with oncology on-call and [name].  Per [name], no need for daily monitoring, expect patient to continue to drop his hemoglobin, would transfuse once just prior to discharge and follow-up with her shortly after; already scheduled.   5/4 pt hgb is 7.9 but he's symptomatic with a history of PAF. HR is elevated today   -will go ahead and transfuse 1u PRBC as opposed to waiting until tomorrow.   Recheck Hgb in am 5/6 14.  Hypothyroidism.  Synthroid  15.  Hyperlipidemia.  Zocor  16.  History of BPH.  Check PVR--no retention per documentation. 17.  GERD.  Protonix  18.  Hypotension/hypertension.  Followed by renal services  Vitals:   10/08/23 2212 10/09/23 0255  BP: (!) 146/98 (!) 145/99  Pulse: (!) 110 (!) 109  Resp: 18 18  Temp: 97.7 F (36.5 C) 97.7 F (36.5 C)  SpO2: 97% 96%   -4/28: Severe  hypertension overnight.  Reduce midodrine  to 5 mg twice daily at 8 AM and noon, get orthostatic vitals today. Encourage PO fluids--remained hypertensive today, decrease  midodrine  to 2.5 mg twice daily tomorrow a.m.  - 4/29: Remains elevated, DC midodrine .  - 4-30: Normotensive off of midodrine , monitor 5/2:  elevated, will start weaning dexamethasone  as above.  Add hydralazine  10 mg as needed for systolic greater than 170, diastolic greater than 110. -5/3 persistent elevation. Steroids being weaned.  -given HR issues, will increase metoprolol  to 37.5mg  bid 5/4 increase metoprolol  to 50mg  bid- just started pm dose yesterday , will need several days to reach steady state , will need f/u with cardiology  19. Occasional cough, reports started couple hours ago             -Start robitussin              -Repeat Xray chest if does not improve  - 4-28: Chest x-ray today with pleural effusion, tapped as above.  Remaining right middle lobe opacity.  No fevers, WBC stable.  Monitor respiratory status following effusion tap.   - 4.29: see above; remains afebrile, WBC stable - monitor  5-2: Incentive spirometry as above  20. Leukopenia- resolved             -Oncology s/p  GCSF, CBC daily    Latest Ref Rng & Units 10/06/2023    6:53 AM 10/05/2023    5:49 AM 10/04/2023   10:03 AM  CBC  WBC 4.0 - 10.5 K/uL 6.1  3.8    Hemoglobin 13.0 - 17.0 g/dL 7.9  7.5  8.5   Hematocrit 39.0 - 52.0 % 25.5  23.8  27.9   Platelets 150 - 400 K/uL 202  194      - 4-30: Recurrent.  Continue daily CBC.--stable  5-2: Per [name], no need for daily monitoring, will monitor q. Monday/Thursday  21. L knee OA             -Xray nov 2024 indicates moderate OA             -Muscle rub prn     22.  Left AC joint disruption - Nonweightbearing left upper extremity with shoulder sling for comfort.    - 4/29: Per ortho OK for WBAT; order changed  23.  Hyperkalemia.  5.8.  Likely secondary to blood transfusion today, already got 40 mg  of IV Lasix  for treatment.  Repeat BMP in a.m., continue IV fluids.  - Ensure changed to Nepro  - 4/29: Cr stable, K improved 5.3. Will give Lokelma  to avoid diuretics. Repeat in AM. EKG w/o T wave changes  4-30: K remains 5.3, no bowel movement yesterday.  Will give another dose Lokelma  5 mg x 1, and increase bowel regimen for effect.  5/1: BM yesterday, BMP pending--resolved, 5.1.  5/2: Multiple bowel movements overnight, potassium up to 5.6.  DC Decadron  as above.  Nephrology to evaluate today, appreciate their recommendations and assistance.--Ordered Lokelma  10 mg  5/3 appreciate renal rec. Changed to renal diet. 24. Diarrhea, acute on chronic.   5-2: Up last night with diarrhea, likely secondary to Lokelma  and increased bowel regimen.  DC MiraLAX , added as needed Imodium .  5/4 improving. Last bm 5/2. Imodium  prn 10 days A FACE TO FACE EVALUATION WAS PERFORMED  [name] 10/09/2023, 8:19 AM

## 2023-10-09 NOTE — Progress Notes (Signed)
 Occupational Therapy Discharge Summary  Patient Details  Name: [name] MRN: [id number] Date of Birth: 05-Oct-1940  Date of Discharge from OT service:Oct 09, 2023  Patient has met 7 of 7 long term goals due to improved activity tolerance, improved balance, postural control, ability to compensate for deficits, functional use of  LEFT upper extremity, improved attention, improved awareness, and improved coordination.  Patient to discharge at overall Supervision level.  Patient's care partner is independent to provide the necessary physical assistance at discharge.  [name] has made good progress in CIR and is now at a supervision level overall. His wife and son attended family edu and feel comfortable providing the level of care he will require at home.   Recommendation:  Patient will benefit from ongoing skilled OT services in outpatient setting to continue to advance functional skills in the area of BADL and iADL.  Equipment: No equipment provided  Reasons for discharge: treatment goals met and discharge from hospital  Patient/family agrees with progress made and goals achieved: Yes  OT Discharge Precautions/Restrictions  Precautions Precautions: Fall Precaution/Restrictions Comments: LUE WBAT Restrictions Weight Bearing Restrictions Per Provider Order: Yes LUE Weight Bearing Per Provider Order: Weight bearing as tolerated Other Position/Activity Restrictions: Lt shoulder WBAT, gentle ROM, sling for comfort. C2 ALL injury  Pain Pain Assessment Pain Scale: 0-10 Pain Score: 0-No pain ADL ADL Eating: Supervision/safety Where Assessed-Eating: Bed level Grooming: Supervision/safety Where Assessed-Grooming: Edge of bed Upper Body Bathing: Supervision/safety Where Assessed-Upper Body Bathing: Shower Lower Body Bathing: Supervision/safety Where Assessed-Lower Body Bathing: Shower Upper Body Dressing: Supervision/safety Where Assessed-Upper Body Dressing: Sitting at sink Lower  Body Dressing: Supervision/safety Where Assessed-Lower Body Dressing: Sitting at sink Toileting: Supervision/safety Where Assessed-Toileting: Teacher, adult education: Distant supervision Statistician Method: Event organiser: Close supervision Film/video editor Method: Designer, industrial/product: Sales promotion account executive Baseline Vision/History: 0 No visual deficits Patient Visual Report: Blurring of vision Vision Assessment?: No apparent visual deficits Perception  Perception: Within Functional Limits Praxis Praxis: WFL Cognition Cognition Overall Cognitive Status: Within Functional Limits for tasks assessed Arousal/Alertness: Awake/alert Orientation Level: Person;Place;Situation Person: Oriented Place: Oriented Situation: Oriented Memory: Appears intact Selective Attention: Appears intact Awareness: Appears intact Problem Solving: Appears intact Organizing: Appears intact Decision Making: Appears intact Safety/Judgment: Appears intact [employer] Scales of Cognitive Functioning: Purposeful, Appropriate: Modified Independent Brief Interview for Mental Status (BIMS) Repetition of Three Words (First Attempt): 3 Temporal Orientation: Year: Correct Temporal Orientation: Month: Accurate within 5 days Temporal Orientation: Day: Correct Recall: "Sock": Yes, no cue required Recall: "Blue": Yes, no cue required Recall: "Bed": Yes, no cue required BIMS Summary Score: 15 Sensation Sensation Light Touch: Appears Intact Hot/Cold: Appears Intact Proprioception: Appears Intact Coordination Gross Motor Movements are Fluid and Coordinated: No Fine Motor Movements are Fluid and Coordinated: No Coordination and Movement Description: Generalized weakness/deconditioning. Improved from eval. Finger Nose Finger Test: WNL Heel Shin Test: [facility] bilaterally Motor  Motor Motor: Within Functional Limits Mobility  Bed Mobility Bed Mobility: Supine  to Sit;Sit to Supine Rolling Right: Supervision/verbal cueing Rolling Left: Supervision/Verbal cueing Supine to Sit: Independent with assistive device Sit to Supine: Independent with assistive device Transfers Sit to Stand: Independent with assistive device Stand to Sit: Independent with assistive device  Trunk/Postural Assessment  Cervical Assessment Cervical Assessment: Exceptions to [facility] (forward head) Thoracic Assessment Thoracic Assessment: Within Functional Limits Lumbar Assessment Lumbar Assessment: Within Functional Limits Postural Control Postural Control: Deficits on evaluation Righting Reactions: delayed  Balance Balance Balance Assessed: Yes Static  Sitting Balance Static Sitting - Balance Support: Feet supported;Bilateral upper extremity supported Static Sitting - Level of Assistance: 6: Modified independent (Device/Increase time) Dynamic Sitting Balance Dynamic Sitting - Balance Support: Feet supported;No upper extremity supported Dynamic Sitting - Level of Assistance: 6: Modified independent (Device/Increase time) Static Standing Balance Static Standing - Balance Support: During functional activity;Bilateral upper extremity supported Static Standing - Level of Assistance: 6: Modified independent (Device/Increase time) Dynamic Standing Balance Dynamic Standing - Balance Support: During functional activity;Bilateral upper extremity supported Dynamic Standing - Level of Assistance: 5: Stand by assistance Extremity/Trunk Assessment RUE Assessment RUE Assessment: Exceptions to [facility] Active Range of Motion (AROM) Comments: Shoulder flexion to 110 degrees General Strength Comments: Awaiting carpal tunnel sx,gross grasp is weak LUE Assessment LUE Assessment: Exceptions to [facility] General Strength Comments: Now WBAT, able to lift shoulder to 110 degrees pain free   [name] 10/09/2023, 11:21 AM

## 2023-10-09 NOTE — Progress Notes (Signed)
 Occupational Therapy Session Note  Patient Details  Name: [name] MRN: [id number] Date of Birth: August 27, 1940  Today's Date: 10/09/2023 OT Individual Time: 1110-1205 OT Individual Time Calculation (min): 55 min    Short Term Goals: Week 2:  OT Short Term Goal 1 (Week 2): STG=LTG d/t ELOS  Skilled Therapeutic Interventions/Progress Updates:    Pt received sitting in the w/c. He reported some soreness at his thoracentesis site, 2/10 and no request for intervention. He reported overall fatigue today from being up late getting blood transfusion. Reviewed d/c planning and final testing.  Pt was taken via w/c to the therapy gym for time management. Reviewed OTAGO A exercises and HEP focusing on BUE AROM and gentle strengthening using a level 1 resistance band. Teachback performed with OTAGO A exercises at the counter with (S). He required frequent rest breaks today 2/2 fatigue. He returned to his room following. Ambulatory transfer back to bed with (S) using RW. He was left supine with all needs met.    Access Code: [email or web address] URL: [email or web address] Date: 10/09/2023 Prepared by: Eilene Grater  Exercises - Seated Shoulder Horizontal Abduction with Resistance  - 1 x daily - 7 x weekly - 3 sets - 10 reps - Seated Shoulder Flexion with Self-Anchored Resistance  - 1 x daily - 7 x weekly - 3 sets - 10 reps - Seated Elbow Flexion with Self-Anchored Resistance  - 1 x daily - 7 x weekly - 3 sets - 10 reps - Seated Shoulder Diagonal Pulls with Resistance  - 1 x daily - 7 x weekly - 3 sets - 10 reps  Therapy Documentation Precautions:  Precautions Precautions: Fall Precaution/Restrictions Comments: LUE WBAT Required Braces or Orthoses: Sling Restrictions Weight Bearing Restrictions Per Provider Order: Yes LUE Weight Bearing Per Provider Order: Weight bearing as tolerated Other Position/Activity Restrictions: Lt shoulder WBAT, gentle ROM, sling for comfort. C2 ALL  injury  Therapy/Group: Individual Therapy  Una Ganser 10/09/2023, 11:41 AM

## 2023-10-09 NOTE — Telephone Encounter (Signed)
 Pharmacy Patient Advocate Encounter  Received notification from [facility] that Prior Authorization for Lidocaine  5% patches  has been APPROVED from 10/09/2023 to 06/05/2024   PA #/Case ID/Reference #: [id number]

## 2023-10-09 NOTE — Telephone Encounter (Signed)
 Pharmacy Patient Advocate Encounter   Received notification from Inpatient Request that prior authorization for Lidocaine  5% patches is required/requested.   Insurance verification completed.   The patient is insured through [employer] .   Per test claim: PA required; PA submitted to above mentioned insurance via CoverMyMeds Key/confirmation #/EOC [id number] Status is pending

## 2023-10-09 NOTE — Progress Notes (Signed)
 Occupational Therapy Session Note  Patient Details  Name: [name] MRN: [id number] Date of Birth: Nov 14, 1940  Today's Date: 10/09/2023 OT Individual Time: 1420-1450 OT Individual Time Calculation (min): 30 min (missed 45 min due to fatigue)   Short Term Goals: Week 1:  OT Short Term Goal 1 (Week 1): STG= LTG d/t ELOS OT Short Term Goal 1 - Progress (Week 1): Progressing toward goal Week 2:  OT Short Term Goal 1 (Week 2): STG=LTG d/t ELOS  Skilled Therapeutic Interventions/Progress Updates:    No c/o pain   Pt received in bed stating "I just called for a urinal".  Suggested to pt that he walk to the toilet as that is what he will be doing at home.  "No I am not getting out of bed and I thought I was done for the day, no more therapy".  Tried again to encourage pt to at least sit to EOB but he refused.  Handed pt the urinal and he said "the nurses always do this for me".  Told him he needed to at least wiggle his pants down and he did partially with OT assisting.  Handed him the urinal and told him to use his hands to position himself in the urinal.  Pt stated "my hearing aids are dead" I placed his hearing aids in charging box.  He barely positioned himself and again told me the nurses do it for him.  Reminded him he is going home tomorrow and pt became frustrated that I was not just helping him. So I did and due to his positioning in the bed as he was not wanting to be adjusted he had a small amount of spillage from urinal onto pants. Helped pt doff his pants and a new brief. Because pt was not getting out of bed, he was alright with resting in bed with covers over his legs. After getting pt cleaned up he said, "thank you so much for all your help. I appreciate it".  Reviewed with pt the importance of getting out of bed at home and putting in daily effort to move. He said he was aware and at home he has a more comfortable recliner he can sit in.  Alarm on and all needs met.    Therapy  Documentation Precautions:  Precautions Precautions: Fall Precaution/Restrictions Comments: LUE WBAT Required Braces or Orthoses: Sling Restrictions Weight Bearing Restrictions Per Provider Order: Yes LUE Weight Bearing Per Provider Order: Weight bearing as tolerated Other Position/Activity Restrictions: Lt shoulder WBAT, gentle ROM, sling for comfort. C2 ALL injury Therapy Vitals Temp: 98.2 F (36.8 C) Temp Source: Oral Pulse Rate: (!) 110 Resp: 16 BP: (!) 125/90 Patient Position (if appropriate): Lying Oxygen Therapy SpO2: 100 % O2 Device: Room Air    ADL: ADL Eating: Supervision/safety Where Assessed-Eating: Bed level Grooming: Supervision/safety Where Assessed-Grooming: Edge of bed Upper Body Bathing: Supervision/safety Where Assessed-Upper Body Bathing: Shower Lower Body Bathing: Supervision/safety Where Assessed-Lower Body Bathing: Shower Upper Body Dressing: Supervision/safety Where Assessed-Upper Body Dressing: Sitting at sink Lower Body Dressing: Supervision/safety Where Assessed-Lower Body Dressing: Sitting at sink Toileting: Supervision/safety Where Assessed-Toileting: Teacher, adult education: Distant supervision Statistician Method: Event organiser: Close supervision Film/video editor Method: Designer, industrial/product: Sales promotion account executive Baseline Vision/History: 0 No visual deficits Patient Visual Report: Blurring of vision Vision Assessment?: No apparent visual deficits Perception  Perception: Within Functional Limits Praxis Praxis: WFL Balance Balance Balance Assessed: Yes Static Sitting Balance Static Sitting - Balance Support:  Feet supported;Bilateral upper extremity supported Static Sitting - Level of Assistance: 6: Modified independent (Device/Increase time) Dynamic Sitting Balance Dynamic Sitting - Balance Support: Feet supported;No upper extremity supported Dynamic Sitting - Level of  Assistance: 6: Modified independent (Device/Increase time) Static Standing Balance Static Standing - Balance Support: During functional activity;Bilateral upper extremity supported Static Standing - Level of Assistance: 6: Modified independent (Device/Increase time) Dynamic Standing Balance Dynamic Standing - Balance Support: During functional activity;Bilateral upper extremity supported Dynamic Standing - Level of Assistance: 5: Stand by assistance    Therapy/Group: Individual Therapy  Frederik Standley 10/09/2023, 3:05 PM

## 2023-10-09 NOTE — Plan of Care (Signed)
  Problem: Consults Goal: RH STROKE PATIENT EDUCATION Description: See Patient Education module for education specifics  Outcome: Progressing   Problem: RH BOWEL ELIMINATION Goal: RH STG MANAGE BOWEL WITH ASSISTANCE Description: STG Manage Bowel with  supervision Assistance. Outcome: Progressing   Problem: RH BLADDER ELIMINATION Goal: RH STG MANAGE BLADDER WITH ASSISTANCE Description: STG Manage Bladder With supervision Assistance Outcome: Progressing   Problem: RH SKIN INTEGRITY Goal: RH STG SKIN FREE OF INFECTION/BREAKDOWN Description: Manage skin free of infection/breakdown with supervision assistance Outcome: Progressing   Problem: RH SAFETY Goal: RH STG ADHERE TO SAFETY PRECAUTIONS W/ASSISTANCE/DEVICE Description: STG Adhere to Safety Precautions With supervision Assistance/Device. Outcome: Progressing   Problem: RH PAIN MANAGEMENT Goal: RH STG PAIN MANAGED AT OR BELOW PT'S PAIN GOAL Description: <4 w/prns Outcome: Progressing   Problem: RH KNOWLEDGE DEFICIT Goal: RH STG INCREASE KNOWLEDGE OF HYPERTENSION Description: Manage increase knowledge of hypertension with supervision assistance from wife using educational materials provided Outcome: Progressing Goal: RH STG INCREASE KNOWLEGDE OF HYPERLIPIDEMIA Description: Manage increase knowledge of hyperlipidemia  with supervision assistance from wife using educational materials provided Outcome: Progressing

## 2023-10-09 NOTE — Progress Notes (Signed)
 Inpatient Rehabilitation Discharge Medication Review by a Pharmacist  A complete drug regimen review was completed for this patient to identify any potential clinically significant medication issues.  High Risk Drug Classes Is patient taking? Indication by Medication  Antipsychotic Yes Seroquel - sleep  Anticoagulant No   Antibiotic No   Opioid Yes Tramadol - acute pain  Antiplatelet No   Hypoglycemics/insulin  No   Vasoactive Medication Yes Amiodarone - rhythm control Lopressor - rate control  Chemotherapy Yes, Oral Chemotherapy Revlimid - myelofibrosis  Other Yes Cymbalta - neuropathic pain Synthroid - hypothyroidism Protonix - GERD Zocor - HLD     Type of Medication Issue Identified Description of Issue Recommendation(s)  Drug Interaction(s) (clinically significant)     Duplicate Therapy     Allergy     No Medication Administration End Date     Incorrect Dose     Additional Drug Therapy Needed     Significant med changes from prior encounter (inform family/care partners about these prior to discharge).    Other  Eliquis  PTA Patient will f/up with outpatient provider for restart of AC (typically 4-8 weeks s/p SAH    Clinically significant medication issues were identified that warrant physician communication and completion of prescribed/recommended actions by midnight of the next day:  No    Time spent performing this drug regimen review (minutes):  30   Tion Tse BS, PharmD, BCPS Clinical Pharmacist 10/09/2023 10:04 AM  Contact: [phone] after 3 PM  "Be curious, not judgmental..." -[name]

## 2023-10-09 NOTE — Consult Note (Signed)
 10/09/2023 10 AM-11 AM:  [name] "[name] is an 83 year old male referred for neuropsychological consultation during his ongoing admission to the comprehensive inpatient rehabilitation unit following parietal lobe traumatic CVA bleed after fall while going into a storage building.  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  Patient was oriented x4 but attention and executive function have likely been acutely impacted.  He should not drive post discharge and we discussed this issue.  He may be able to return to driving with further recovery but visual processing is impacted.

## 2023-10-09 NOTE — Progress Notes (Cosign Needed)
 Progress Note  Patient Name: [name] Date of Encounter: 10/09/2023  Primary Cardiologist: Timothy Gollan, MD   Subjective   Patient evaluated bedside, laying in bed, no acute distress.  Alert and oriented x 4.  Denies any chest pain or shortness of breath.  No concerns this morning.  Inpatient Medications    Scheduled Meds:  sodium chloride    Intravenous Once   amiodarone   200 mg Oral Daily   dexamethasone   2 mg Oral Daily   Followed by   [name] ON 10/11/2023] dexamethasone   1 mg Oral Daily   DULoxetine   20 mg Oral QHS   feeding supplement (NEPRO CARB STEADY)  237 mL Oral TID WC   levothyroxine   100 mcg Oral QAC breakfast   lidocaine   1 patch Transdermal Q24H   metoprolol  tartrate  50 mg Oral BID   multivitamin with minerals  1 tablet Oral Daily   pantoprazole   40 mg Oral Daily   senna-docusate  2 tablet Oral BID   simvastatin   40 mg Oral QHS   sodium bicarbonate   1,300 mg Oral TID   sodium zirconium cyclosilicate   5 g Oral Q M,W,F   Continuous Infusions: None  PRN Meds: acetaminophen  **OR** acetaminophen , guaiFENesin -dextromethorphan , hydrALAZINE , loperamide , melatonin, methocarbamol , Muscle Rub, ondansetron  **OR** ondansetron  (ZOFRAN ) IV, QUEtiapine , sorbitol , traMADol    Vital Signs    Vitals:   10/08/23 2212 10/09/23 0255 10/09/23 0500 10/09/23 0811  BP: (!) 146/98 (!) 145/99    Pulse: (!) 110 (!) 109    Resp: 18 18    Temp: 97.7 F (36.5 C) 97.7 F (36.5 C)    TempSrc:      SpO2: 97% 96%    Weight:   74.1 kg 74.3 kg  Height:        Intake/Output Summary (Last 24 hours) at 10/09/2023 1306 Last data filed at 10/09/2023 0800 Gross per 24 hour  Intake 798 ml  Output 720 ml  Net 78 ml   Filed Weights   10/08/23 1711 10/09/23 0500 10/09/23 0811  Weight: 74 kg 74.1 kg 74.3 kg    Telemetry    Patient is currently not on telemetry.  ECG    05/01: A Flutter   Physical Exam   GEN: No acute distress.   Cardiac: Tachycardic. Respiratory: Breathing  comfortably  GI: Soft, nontender, non-distended  MS: No edema; No deformity. Neuro:  AO x 4. No focal deficits   Psych: Normal affect   Labs    Chemistry Recent Labs  Lab 10/06/23 0653 10/07/23 1141 10/09/23 0536  NA 140 140 140  K 5.6* 5.2* 5.2*  CL 112* 109 109  CO2 22 21* 21*  GLUCOSE 102* 96 88  BUN 90* 83* 78*  CREATININE 3.25* 2.99* 3.09*  CALCIUM  7.2* 7.3* 7.0*  GFRNONAA 18* 20* 19*  ANIONGAP 6 10 10      Hematology Recent Labs  Lab 10/05/23 0549 10/06/23 0653 10/09/23 0536  WBC 3.8* 6.1 7.6  RBC 2.64* 2.78* 3.20*  HGB 7.5* 7.9* 9.1*  HCT 23.8* 25.5* 28.9*  MCV 90.2 91.7 90.3  MCH 28.4 28.4 28.4  MCHC 31.5 31.0 31.5  RDW 18.4* 18.4* 18.2*  PLT 194 202 193    DDimer  Recent Labs  Lab 10/03/23 1320  DDIMER 1.75*     Radiology    No results found.  Cardiac Studies   ECHO 02/20: LVEF 50-55%, no regional wall motion abnormalities, G1 DD, Normal RV function, mild to moderate MV regurgitation.   Patient Profile  83 y.o. male with past medical history of atrial fibrillation on Eliquis , CKD 4, nephrolithiasis, HLD, HTN, hypothyroidism, BPH, CVA and myelofibrosis admitted for traumatic subarachnoid hemorrhage secondary to fall on 09/21/2023, admitted to rehab on 09/29/23, was noted to have new Atrial flutter on 04/29. He has had had 2 prior [facility]; presently not on anticoagulation due to recent [facility].   Assessment & Plan    Paroxymal Atrial Flutter Hx of Afib  Noted on 10/03/23 with HR 118; not a candidate for anticoagulation due to recent [facility]. Will need neuro to evaluate for safety of Eliquis  in the setting of SAH. Presently, stable with NSR.   - Continue Lopressor  50 mg BID - Continue Amiodarone  200 mg daily   R Pleural effusion S/p IR thoracentesis 04/28 with 825 cc fluid removal, repeat CXR 05/02 stable small R pleural effusion.    AKI on CKD IV  Hyperkalemia Managed by nephrology, Cr increased 3.09 (2.99), K unchanged 5.2 (5.2).  - Avoid  nephrotoxic agents  - Lokelma  as per nephrology and primary team   Myelodysplastic syndrome S/p 1 pRBC 05/04, Hgb stable at 9.1, hct 28.9. 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  - Manage per primary   For questions or updates, please contact [facility] Please consult [email or web address] for contact info under Cardiology/STEMI.      Signed, Lanney Pitts, DO  10/09/2023, 1:06 PM    Attending Note:   The patient was seen and examined.  Agree with assessment and plan as noted above.  Changes made to the above note as needed.  Patient seen and independently examined with Roya Tawkaliyar, DO .   We discussed all aspects of the encounter. I agree with the assessment and plan as stated above.    Atrial fib :  he is not a candidate for anticoagulation due to his SAH.  We will defer to neurology as to if / when it will be safe to resume his eliquis   Cont amiodarone , metoprolol   No further recs.  Will check ecg      I have spent a total of 40 minutes with patient reviewing hospital  notes , telemetry, EKGs, labs and examining patient as well as establishing an assessment and plan that was discussed with the patient.  > 50% of time was spent in direct patient care.    [place], Marieta Shorten., MD, [facility] 10/10/2023, 8:22 AM [address]  [address] Office [phone] Pager [phone]

## 2023-10-09 NOTE — Plan of Care (Signed)
  Problem: Consults Goal: RH STROKE PATIENT EDUCATION Description: See Patient Education module for education specifics  Outcome: Progressing   Problem: RH BOWEL ELIMINATION Goal: RH STG MANAGE BOWEL WITH ASSISTANCE Description: STG Manage Bowel with  supervision Assistance. Outcome: Progressing   Problem: RH BLADDER ELIMINATION Goal: RH STG MANAGE BLADDER WITH ASSISTANCE Description: STG Manage Bladder With supervision Assistance Outcome: Progressing   Problem: RH SKIN INTEGRITY Goal: RH STG SKIN FREE OF INFECTION/BREAKDOWN Description: Manage skin free of infection/breakdown with supervision assistance Outcome: Progressing   Problem: RH SAFETY Goal: RH STG ADHERE TO SAFETY PRECAUTIONS W/ASSISTANCE/DEVICE Description: STG Adhere to Safety Precautions With supervision Assistance/Device. Outcome: Progressing

## 2023-10-10 ENCOUNTER — Inpatient Hospital Stay: Admitting: Oncology

## 2023-10-10 ENCOUNTER — Inpatient Hospital Stay

## 2023-10-10 DIAGNOSIS — I480 Paroxysmal atrial fibrillation: Secondary | ICD-10-CM

## 2023-10-10 LAB — CBC
HCT: 27.4 % — ABNORMAL LOW (ref 39.0–52.0)
Hemoglobin: 8.7 g/dL — ABNORMAL LOW (ref 13.0–17.0)
MCH: 28.6 pg (ref 26.0–34.0)
MCHC: 31.8 g/dL (ref 30.0–36.0)
MCV: 90.1 fL (ref 80.0–100.0)
Platelets: 165 10*3/uL (ref 150–400)
RBC: 3.04 MIL/uL — ABNORMAL LOW (ref 4.22–5.81)
RDW: 18.1 % — ABNORMAL HIGH (ref 11.5–15.5)
WBC: 5.2 10*3/uL (ref 4.0–10.5)
nRBC: 0.4 % — ABNORMAL HIGH (ref 0.0–0.2)

## 2023-10-10 LAB — BASIC METABOLIC PANEL WITH GFR
Anion gap: 11 (ref 5–15)
BUN: 80 mg/dL — ABNORMAL HIGH (ref 8–23)
CO2: 20 mmol/L — ABNORMAL LOW (ref 22–32)
Calcium: 6.9 mg/dL — ABNORMAL LOW (ref 8.9–10.3)
Chloride: 109 mmol/L (ref 98–111)
Creatinine, Ser: 3.03 mg/dL — ABNORMAL HIGH (ref 0.61–1.24)
GFR, Estimated: 20 mL/min — ABNORMAL LOW (ref >60)
Glucose, Bld: 80 mg/dL (ref 70–99)
Potassium: 5.2 mmol/L — ABNORMAL HIGH (ref 3.5–5.1)
Sodium: 140 mmol/L (ref 135–145)

## 2023-10-10 NOTE — Progress Notes (Addendum)
 Progress Note  Patient Name: [name] Date of Encounter: 10/10/2023  Primary Cardiologist: Timothy Gollan, MD   Subjective   Patient is evaluated at bedside, reports that he is doing well this morning. Denies any chest pain, shortness of breath, palpitations. Denies any abdominal pain, nausea or vomiting.   Inpatient Medications    Scheduled Meds:  sodium chloride    Intravenous Once   amiodarone   200 mg Oral Daily   [START ON 10/11/2023] dexamethasone   1 mg Oral Daily   DULoxetine   20 mg Oral QHS   feeding supplement (NEPRO CARB STEADY)  237 mL Oral TID WC   levothyroxine   100 mcg Oral QAC breakfast   lidocaine   1 patch Transdermal Q24H   metoprolol  tartrate  50 mg Oral BID   multivitamin with minerals  1 tablet Oral Daily   pantoprazole   40 mg Oral Daily   senna-docusate  2 tablet Oral BID   simvastatin   40 mg Oral QHS   sodium bicarbonate   1,300 mg Oral TID   sodium zirconium cyclosilicate   5 g Oral Q M,W,F   Continuous Infusions: None  PRN Meds: acetaminophen  **OR** acetaminophen , guaiFENesin -dextromethorphan , hydrALAZINE , loperamide , melatonin, methocarbamol , Muscle Rub, ondansetron  **OR** ondansetron  (ZOFRAN ) IV, QUEtiapine , sorbitol , traMADol    Vital Signs    Vitals:   10/09/23 1945 10/10/23 0328 10/10/23 0456 10/10/23 0900  BP: 121/88 (!) 141/98  (!) 147/96  Pulse: (!) 108 (!) 106  (!) 104  Resp: 17 17    Temp: 98.6 F (37 C) 98.5 F (36.9 C)    TempSrc:  Oral    SpO2: 99% 98%    Weight:   74.9 kg   Height:        Intake/Output Summary (Last 24 hours) at 10/10/2023 0950 Last data filed at 10/09/2023 1700 Gross per 24 hour  Intake 237 ml  Output --  Net 237 ml   Filed Weights   10/09/23 0500 10/09/23 0811 10/10/23 0456  Weight: 74.1 kg 74.3 kg 74.9 kg    Telemetry    Patient is currently not on tele - Personally Reviewed  ECG    10/10/2023:   Atrial flutter with HR of 110.     Physical Exam   GEN: Laying in bed, no acute distress    Cardiac: tachycardiac  Respiratory: Breathing comfortably  GI: Soft, nontender, non-distended  MS: No edema; Neuro:  answering questions appropriately, no focal deficits noted  Psych: Normal affect   Labs    Chemistry Recent Labs  Lab 10/07/23 1141 10/09/23 0536 10/10/23 0510  NA 140 140 140  K 5.2* 5.2* 5.2*  CL 109 109 109  CO2 21* 21* 20*  GLUCOSE 96 88 80  BUN 83* 78* 80*  CREATININE 2.99* 3.09* 3.03*  CALCIUM  7.3* 7.0* 6.9*  GFRNONAA 20* 19* 20*  ANIONGAP 10 10 11      Hematology Recent Labs  Lab 10/06/23 0653 10/09/23 0536 10/10/23 0510  WBC 6.1 7.6 5.2  RBC 2.78* 3.20* 3.04*  HGB 7.9* 9.1* 8.7*  HCT 25.5* 28.9* 27.4*  MCV 91.7 90.3 90.1  MCH 28.4 28.4 28.6  MCHC 31.0 31.5 31.8  RDW 18.4* 18.2* 18.1*  PLT 202 193 165    DDimer  Recent Labs  Lab 10/03/23 1320  DDIMER 1.75*     Radiology    No results found.  Cardiac Studies   ECHO 07/27/2023  IMPRESSIONS    1. Left ventricular ejection fraction, by estimation, is 50 to 55%. Left  ventricular ejection fraction  by 3D volume is 54 %. Left ventricular  ejection fraction by 2D MOD biplane is 51.3 %. The left ventricle has low  normal function. The left ventricle  has no regional wall motion abnormalities. The left ventricular internal  cavity size was mildly dilated. Left ventricular diastolic parameters are  consistent with Grade I diastolic dysfunction (impaired relaxation).   2. Right ventricular systolic function is normal. The right ventricular  size is normal.   3. The mitral valve is normal in structure. Mild to moderate mitral valve  regurgitation. No evidence of mitral stenosis.   4. The aortic valve is tricuspid. Aortic valve regurgitation is not  visualized. No aortic stenosis is present.   Patient Profile     83 y.o. male with past medical history of atrial fibrillation on Eliquis , CKD 4, nephrolithiasis, HLD, HTN, hypothyroidism, BPH, CVA and myelofibrosis admitted for  traumatic subarachnoid hemorrhage secondary to fall on 09/21/2023, admitted to rehab on 09/29/23, was noted to have new Atrial flutter on 04/29. He has had had 2 prior [facility]; presently not on anticoagulation due to recent [facility].   Assessment & Plan    Paroxymal Atrial Flutter- NSR Hx of Afib First Degree AV block   Noted on 10/03/23 with HR 118; not a candidate for anticoagulation due to recent [facility]. EKG this AM with sinus tachycardia with ventricular rate 113 bpm and first degree AV block. Vitals were reviewed, HR 104-110 range over 12 hours. He denies any chest pain or shortness of breath.  - Continue Lopressor  50 mg BID - Continue Amiodarone  200 mg daily  - Will defer to neurology for Eliquis  - Will need outpatient follow up with cardiology    R Pleural effusion S/p IR thoracentesis 04/28 with 825 cc fluid removal, repeat CXR 05/02 stable small R pleural effusion. He is presently on room air, breathing comfortably. No respiratory distress.    AKI on CKD IV  Hyperkalemia Managed by nephrology, Scr stable at 3.03, K unchanged 5.2 (5.2).  - Avoid nephrotoxic agents  - Lokelma  as per nephrology and primary team    Myelodysplastic syndrome S/p 1 pRBC 05/04, Hgb stable at 8.7; hct 27.4. 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  - Manage per primary    [facility] will sign off.   Medication Recommendations:  Lopressor  50 mg BID and Amiodarone  200 mg daily  Other recommendations (labs, testing, etc):  BMP and CBC  Follow up as an outpatient:  Cardiology/ Afib clinic  For questions or updates, please contact [facility] Please consult [email or web address] for contact info under Cardiology/STEMI.      Signed, Lanney Pitts, DO  10/10/2023, 9:50 AM    Attending Note:   The patient was seen and examined.  Agree with  assessment and plan as noted above.  Changes made to the above note as needed.  Patient seen and independently examined with Roya Tawkaliyar, DO .   We discussed all aspects of the encounter. I agree with the assessment and plan as stated above.    Atrial fib:  currently in sinus rhythm Continue amiodarone  and metoprolol  at current dose   He will follow up with Dr. Gollan / A   I have spent a total of 40 minutes with patient reviewing hospital  notes , telemetry, EKGs, labs and examining patient as well as establishing an assessment  and plan that was discussed with the patient.  > 50% of time was spent in direct patient care.    [place], Marieta Shorten., MD, [facility] 10/10/2023, 10:02 AM [address]  [address] Office [phone] Pager [phone]

## 2023-10-10 NOTE — Progress Notes (Signed)
 Patient ID: [name], male   DOB: 21-Aug-1940, 83 y.o.   MRN: [id number] Pt requesting rolling walker when asked has an old one needs a new one. Have made referral to [employer] and let all know-pt, son and wife will take a little time to get here.

## 2023-10-10 NOTE — Progress Notes (Signed)
 PROGRESS NOTE   Subjective/Complaints:  No new issues. Feels well. Blood transfusion helped his energy.   ROS: Patient denies fever, rash, sore throat, blurred vision, dizziness, nausea, vomiting, diarrhea, cough,   chest pain, joint or back/neck pain, headache, or mood change.    Objective:   No results found.   Recent Labs    10/09/23 0536 10/10/23 0510  WBC 7.6 5.2  HGB 9.1* 8.7*  HCT 28.9* 27.4*  PLT 193 165    Recent Labs    10/09/23 0536 10/10/23 0510  NA 140 140  K 5.2* 5.2*  CL 109 109  CO2 21* 20*  GLUCOSE 88 80  BUN 78* 80*  CREATININE 3.09* 3.03*  CALCIUM  7.0* 6.9*    Intake/Output Summary (Last 24 hours) at 10/10/2023 0939 Last data filed at 10/09/2023 1700 Gross per 24 hour  Intake 237 ml  Output --  Net 237 ml         Physical Exam: Vital Signs Blood pressure (!) 147/96, pulse (!) 104, temperature 98.5 F (36.9 C), temperature source Oral, resp. rate 17, height 5\' 11"  (1.803 m), weight 74.9 kg, SpO2 98%.   Constitutional: No distress . Vital signs reviewed. HEENT: NCAT, EOMI, oral membranes moist Neck: supple Cardiovascular: RRR without murmur. No JVD    Respiratory/Chest: CTA Bilaterally without wheezes or rales. Normal effort    GI/Abdomen: BS +, non-tender, non-distended Ext: no clubbing, cyanosis, or edema Psych: pleasant and cooperative  Skin: No evidence of breakdown, no evidence of rash Neurologic:   Cranial nerves II through XII intact, motor strength is 4-5-/5 throughout, no apparent sensory deficits. Prior neuro assessment is c/w today's exam 10/10/2023.  Musculoskeletal: Full range of motion in all 4 extremities. No joint swelling. No calf tenderness.     Assessment/Plan: 1. Functional deficits which require 3+ hours per day of interdisciplinary therapy in a comprehensive inpatient rehab setting. Physiatrist is providing close team supervision and 24 hour management of  active medical problems listed below. Physiatrist and rehab team continue to assess barriers to discharge/monitor patient progress toward functional and medical goals  Care Tool:  Bathing    Body parts bathed by patient: Right arm, Left arm, Chest, Abdomen, Front perineal area, Right upper leg, Left upper leg, Right lower leg, Left lower leg, Face, Buttocks   Body parts bathed by helper: Front perineal area, Buttocks     Bathing assist Assist Level: Supervision/Verbal cueing     Upper Body Dressing/Undressing Upper body dressing   What is the patient wearing?: Pull over shirt    Upper body assist Assist Level: Set up assist    Lower Body Dressing/Undressing Lower body dressing      What is the patient wearing?: Underwear/pull up, Pants     Lower body assist Assist for lower body dressing: Supervision/Verbal cueing     Toileting Toileting    Toileting assist Assist for toileting: Supervision/Verbal cueing     Transfers Chair/bed transfer  Transfers assist     Chair/bed transfer assist level: Independent with assistive device Chair/bed transfer assistive device: Geologist, engineering   Ambulation assist      Assist level: Supervision/Verbal cueing Assistive device: Walker-rolling  Max distance: >150'   Walk 10 feet activity   Assist     Assist level: Supervision/Verbal cueing Assistive device: Walker-rolling   Walk 50 feet activity   Assist    Assist level: Supervision/Verbal cueing Assistive device: Walker-rolling    Walk 150 feet activity   Assist Walk 150 feet activity did not occur: Safety/medical concerns (fatigue)  Assist level: Supervision/Verbal cueing Assistive device: Walker-rolling    Walk 10 feet on uneven surface  activity   Assist     Assist level: Supervision/Verbal cueing Assistive device: Walker-rolling   Wheelchair     Assist Is the patient using a wheelchair?: No Type of Wheelchair: Manual     Wheelchair assist level: Dependent - Patient 0% Max wheelchair distance: 164ft    Wheelchair 50 feet with 2 turns activity    Assist        Assist Level: Dependent - Patient 0%   Wheelchair 150 feet activity     Assist      Assist Level: Dependent - Patient 0%   Blood pressure (!) 147/96, pulse (!) 104, temperature 98.5 F (36.9 C), temperature source Oral, resp. rate 17, height 5\' 11"  (1.803 m), weight 74.9 kg, SpO2 98%.  Medical Problem List and Plan: 1. Functional deficits secondary to traumatic SAH fall 4/17             Dc home today  -f/u with [facility], cardiiology, primary, NS, nephro, oncology      2.  Antithrombotics: -DVT/anticoagulation:  Mechanical: Antiembolism stockings, thigh (TED hose) Bilateral lower extremities.  Chronic Eliquis  currently on hold due to [facility]- also pt still with elevated fall risk  -dopplers clear 5/2, start SCD's             -antiplatelet therapy: N/A  -4-29: With ongoing tachycardia and shortness of breath off of AC, need to rule out PE.  Discussed with cardiology and nephrology, will get D-dimer --> nuc med scan if positive.  Will also order bilateral lower extremity duplex  4/30; respiratory symptoms resolved, tachycardia improved with treatment of a flutter.  D-dimer was positive, however explained by comorbidities and with low suspicion for PE at this point; DC nuc med scan.    5/6 coordinate resumption of eliquis  with neurology vs neurosurgery since this was traumatic, lives in [place]---> can see Dr Felipe Horton or Jeris Montes vs [place] her in [place] 3. Pain Management: LidoDerm  patch, Robaxin  as needed, tramadol  as needed 4. Mood/Behavior/Sleep: Provide emotional support             -antipsychotic agents: Seroquel  25 mg nightly  -sleeping well 5. Neuropsych/cognition: This patient is capable of making decisions on his own behalf. 6. Skin/Wound Care: Routine skin checks 7. Fluids/Electrolytes/Nutrition: Routine ins and outs with  follow-up chemistries  - 4-28: Very poor p.o. intakes over the weekend, 0% of meals and only 240 cc of p.o. intakes yesterday.  Will get nutrition consult to help evaluate today.  4-30: Eating a little better, at least 1 meal 100 %/day.  5-2: Nephrology adjusted to renal diet; patient endorsed this has been discussed with him in the past but he does not know what constitutes a renal diet.  Nutritional consult to assist with education today.  8.  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 Neurosurgery follow-up no current plan for surgical intervention however patient is currently maintained on Decadron  therapy for pain control.  -  5/2: Wean off Decadron  due to recurrent hyperkalemia, hypertension.  Given that patient has been on this medication more than 2 weeks, we will start wean over the next 6 days:   - 5 mg daily x2 days   - 2 mg daily x2 days   - 1 mg dailg x2 days   - stop  9.  Recurrent large right pleural effusion.  Status post ultrasound thoracentesis 1.6 L fluid removed.  - 4-28: Chest x-ray today with recurrence, IR tapped for 825 cc of dark amber fluid.  Follow-up chest x-ray improved.    5/2: Chest x-ray this morning with stable, small right pleural effusion and right basilar atelectasis.  Add incentive spirometry.  Respiratory status stable, no indication for tap at this time.   -5/3-4 arrange follow up with pulmonary as outpt   10.  PAF.  Rate controlled.  Continue amiodarone .  Eliquis  on hold due to [facility].  - 4-28: rergular tachycardia today, c/b anemia and pleural effusion.  Treat causative sources first. - 4.29: remains tachycardic,  regular rhythm. EKG showing atrial flutter.  Added metoprolol  12.5 twice daily, cardiology consulted, appreciate recommendations. 4-30: Rate improved, patient feeling much better.  Cardiology  increased metoprolol  to 25 mg twice daily.  Also increased amiodarone .  Appreciate their insight and recommendations. 5-3: Heart rate still elevated at times but improved. Reg rhythm this morning.  Appreciate cardiology assistance.  11.  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  Follow-up nephrology services.  Renal ultrasound no hydronephrosis.  Currently no plans for HD. Avoid nephrotoxins. Monitor labs. Maintain MAP > 65  - 4-28: Creatinine down to 3.6 from prior 4.5; BUN is increasing.  Hyperkalemic status post transfusion today.  Has been on continuous IVF--Dced today   - 4/29: Per nephro, if contrast needed, avoid ACE/ARB  12.  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  Follow-up orthopedic services Dr. Abigail Abler.    13.  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  Followed by oncology services [name].  Prior to admission patient was receiving Revlimid  5 mg mouth daily for 21 days then hold for 7 days repeating every 28 days  4/28: Hemoglobin steadily downtrending to 7.1 today; given persistent tachycardia and symptoms, will transfuse 1 unit CMV negative today and follow with 40 mg IV Lasix .  4/29: HgB improved 8.9; monitor  4/30: Down to 7.8 again, WBC down to 3.6. H/H, FOBT ordered. --H&H 8.5, unchanged  5/1: HgB 7.5, WBC stable 3.8. FOBT negative.  Spoke with oncology on-call and [name].  Per [name], no need for daily monitoring, expect patient to continue to drop his hemoglobin, would transfuse once just prior to discharge and follow-up with her shortly after; already scheduled.   5/4 pt hgb is 7.9 but he's symptomatic with a history of PAF. HR is elevated today   -will go ahead and transfuse 1u PRBC as opposed to waiting until tomorrow.   5/6 hgb sl decreased today but improved from Friday   -outpt f/u with [name] 14.  Hypothyroidism.  Synthroid  15.  Hyperlipidemia.  Zocor  16.  History of BPH.  Check PVR--no retention per documentation. 17.  GERD.   Protonix  18.  Hypotension/hypertension.  Followed by renal services  Vitals:   10/10/23 0328 10/10/23 0900  BP: (!) 141/98 (!) 147/96  Pulse: (!) 106 (!) 104  Resp: 17   Temp: 98.5 F (36.9 C)   SpO2: 98%    -4/28: Severe hypertension overnight.  Reduce midodrine  to 5 mg twice  daily at 8 AM and noon, get orthostatic vitals today. Encourage PO fluids--remained hypertensive today, decrease midodrine  to 2.5 mg twice daily tomorrow a.m.  - 4/29: Remains elevated, DC midodrine .  - 4-30: Normotensive off of midodrine , monitor 5/2:  elevated, will start weaning dexamethasone  as above.  Add hydralazine  10 mg as needed for systolic greater than 170, diastolic greater than 110. -5/3 persistent elevation. Steroids being weaned.  -given HR issues, will increase metoprolol  to 37.5mg  bid 5/6. Metoprolol  50mg  bid, fair control. Outpt cards f/u  -optimize hgb  -increase physical activity and improve po intake.  19. Occasional cough, reports started couple hours ago             -Start robitussin              -Repeat Xray chest if does not improve  - 4-28: Chest x-ray today with pleural effusion, tapped as above.  Remaining right middle lobe opacity.  No fevers, WBC stable.  Monitor respiratory status following effusion tap.   - 4.29: see above; remains afebrile, WBC stable - monitor  5-2: Incentive spirometry as above  20. Leukopenia- resolved             -Oncology s/p  GCSF, CBC daily    Latest Ref Rng & Units 10/10/2023    5:10 AM 10/09/2023    5:36 AM 10/06/2023    6:53 AM  CBC  WBC 4.0 - 10.5 K/uL 5.2  7.6  6.1   Hemoglobin 13.0 - 17.0 g/dL 8.7  9.1  7.9   Hematocrit 39.0 - 52.0 % 27.4  28.9  25.5   Platelets 150 - 400 K/uL 165  193  202     - 4-30: Recurrent.  Continue daily CBC.--stable  5-2: Per [name], no need for daily monitoring, will monitor q. Monday/Thursday  21. L knee OA             -Xray nov 2024 indicates moderate OA             -Muscle rub prn     22.  Left AC joint  disruption - Nonweightbearing left upper extremity with shoulder sling for comfort.    - 4/29: Per ortho OK for WBAT; order changed  23.  Hyperkalemia.  5.8.  Likely secondary to blood transfusion today, already got 40 mg of IV Lasix  for treatment.  Repeat BMP in a.m., continue IV fluids.  - Ensure changed to Nepro  - 4/29: Cr stable, K improved 5.3. Will give Lokelma  to avoid diuretics. Repeat in AM. EKG w/o T wave changes  4-30: K remains 5.3, no bowel movement yesterday.  Will give another dose Lokelma  5 mg x 1, and increase bowel regimen for effect.  5/1: BM yesterday, BMP pending--resolved, 5.1.  5/2: Multiple bowel movements overnight, potassium up to 5.6.  DC Decadron  as above.  Nephrology to evaluate today, appreciate their recommendations and assistance.--Ordered Lokelma  10 mg  5/6- persistent elevation   -lokelma  5mg  qMWF 24. Diarrhea, acute on chronic.   5-2: Up last night with diarrhea, likely secondary to Lokelma  and increased bowel regimen.  DC MiraLAX , added as needed Imodium .  LBM 5/5 11 days A FACE TO FACE EVALUATION WAS PERFORMED  Rawland Caddy 10/10/2023, 9:39 AM

## 2023-10-10 NOTE — Progress Notes (Signed)
 [facility] Liaison Note  10/10/2023  [name] 1940-08-13 [id number]  Location: RN Hospital Liaison screened the patient remotely at [facility].  Insurance: Medicare   [name] is a 83 y.o. male who is a Primary Care Patient of Lincoln Renshaw, Jerilee Montane, DO  [facility]. The patient was screened for  readmission hospitalization with noted extreme risk score for unplanned readmission risk with 3 IP in 6 months.  The patient was assessed for potential Care Management service needs for post hospital transition for care coordination. Review of patient's electronic medical record reveals patient was admitted for Traumatic subarachnoid hemorrhage. Pt recommended for outpatient PT/OT post hospital discharge. No additional needs presented upon his discharge today. VBCI listed for transition of care follow up calls. Pt followed heavily by oncology team.   Plan: [facility] Liaison will continue to follow progress and disposition to asess for post hospital community care coordination/management needs.  Referral request for community care coordination: anticipate Transitions of Care Team follow up.   VBCI Care Management/Population Health does not replace or interfere with any arrangements made by the Inpatient Transition of Care team.   For questions contact:   Lilla Reichert, RN, [facility] Liaison [place]   [facility], Population Health Office Hours MTWF  8:00 am-6:00 pm Direct Dial: [phone] mobile [email or web address]  '

## 2023-10-10 NOTE — Progress Notes (Signed)
 Notified Dr. Alroy Aspen patient's EKG results.   Christian Busman, LPN

## 2023-10-10 NOTE — Progress Notes (Signed)
 Rolling walker delivered Discharge instructions provided by Genella Kendall, PA and medications received from [facility]. Patient discharged via private car with wife and son.   Randeen Busman, LPN

## 2023-10-10 NOTE — Progress Notes (Signed)
 Patient ID: [name], male   DOB: [birth year], 83 y.o.   MRN: [id number]  *SW called pt wife to discuss outpatient or HH follow-up. Amenable o outpatient PT/OT at [facility]. SW provided contact information and encouraged to follow-up  On Thursday.    Norval Been, MSW, LCSW Office: [phone] Cell: [phone] Fax: [phone]

## 2023-10-11 ENCOUNTER — Telehealth: Payer: Self-pay

## 2023-10-11 ENCOUNTER — Inpatient Hospital Stay

## 2023-10-11 NOTE — Progress Notes (Signed)
 Inpatient Rehabilitation Care Coordinator Discharge Note   Patient Details  Name: [name] MRN: [id number] Date of Birth: 1941-05-08   Discharge location: D/c to home  Length of Stay: 10 days  Discharge activity level: Supervision  Home/community participation: Limited  Patient response WU:JWJXBJ Literacy - How often do you need to have someone help you when you read instructions, pamphlets, or other written material from your doctor or pharmacy?: Rarely  Patient response YN:WGNFAO Isolation - How often do you feel lonely or isolated from those around you?: Never  Services provided included: MD, RD, PT, OT, RN, CM, TR, Pharmacy, Neuropsych, SW  Financial Services:  Financial Services Utilized: Medicare    Choices offered to/list presented to: patient wife  Follow-up services arranged:  Outpatient, DME    Outpatient Servicies:  [facility] DME : [facility] for RW    Patient response to transportation need: Is the patient able to respond to transportation needs?: Yes In the past 12 months, has lack of transportation kept you from medical appointments or from getting medications?: No In the past 12 months, has lack of transportation kept you from meetings, work, or from getting things needed for daily living?: No   Patient/Family verbalized understanding of follow-up arrangements:  Yes  Individual responsible for coordination of the follow-up plan: contact pt  Confirmed correct DME delivered: [name] 10/11/2023    Comments (or additional information):fam edu completed  Summary of Stay    Date/Time Discharge Planning CSW  10/03/23 1004 TBA. Pt will d/c to home with his wife, and physical support from their son who is IDD. SW will confirm there are no barriers to discharge. AAC       [name]

## 2023-10-11 NOTE — Telephone Encounter (Signed)
 Transitional Care call--who you talked with    Are you/is patient experiencing any problems since coming home? No Are there any questions regarding any aspect of care? No Are there any questions regarding medications administration/dosing? No Are meds being taken as prescribed? Yes Patient should review meds with caller to confirm? Reviewed Have there been any falls? No Has Home Health been to the house and/or have they contacted you? Not yet If not, have you tried to contact them? Not yet Can we help you contact them? Not at this time Are bowels and bladder emptying properly? Yes Are there any unexpected incontinence issues? No Is patient following bowel/bladder programs? No Any fevers, problems with breathing, unexpected pain? No Are there any skin problems or new areas of breakdown? No Has the patient/family member arranged specialty MD follow up (ie cardiology/neurology/renal/surgical/etc)?  No Does the patient need any other services or support that we can help arrange? No Are caregivers following through as expected in assisting the patient? Yes Has the patient quit smoking, drinking alcohol, or using drugs as recommended? No  Appointment information 10/24/2023  arrive time: 2:20 pm  Appointment:  2:40 pm With Ford Ide, NP.  [address] suite [phone]

## 2023-10-12 ENCOUNTER — Ambulatory Visit: Payer: Medicare Other | Admitting: Dermatology

## 2023-10-16 ENCOUNTER — Encounter (HOSPITAL_COMMUNITY): Payer: Self-pay

## 2023-10-16 ENCOUNTER — Inpatient Hospital Stay
Admission: EM | Admit: 2023-10-16 | Discharge: 2023-10-24 | DRG: 871 | Disposition: A | Discharge status: Home Health | Attending: Osteopathic Medicine | Admitting: Osteopathic Medicine

## 2023-10-16 ENCOUNTER — Other Ambulatory Visit: Payer: Self-pay

## 2023-10-16 ENCOUNTER — Ambulatory Visit: Payer: Self-pay

## 2023-10-16 ENCOUNTER — Emergency Department

## 2023-10-16 DIAGNOSIS — J189 Pneumonia, unspecified organism: Secondary | ICD-10-CM

## 2023-10-16 DIAGNOSIS — Z515 Encounter for palliative care: Secondary | ICD-10-CM | POA: Diagnosis not present

## 2023-10-16 DIAGNOSIS — Z8249 Family history of ischemic heart disease and other diseases of the circulatory system: Secondary | ICD-10-CM

## 2023-10-16 DIAGNOSIS — Z79899 Other long term (current) drug therapy: Secondary | ICD-10-CM

## 2023-10-16 DIAGNOSIS — A419 Sepsis, unspecified organism: Principal | ICD-10-CM | POA: Diagnosis present

## 2023-10-16 DIAGNOSIS — J90 Pleural effusion, not elsewhere classified: Secondary | ICD-10-CM | POA: Diagnosis not present

## 2023-10-16 DIAGNOSIS — R54 Age-related physical debility: Secondary | ICD-10-CM | POA: Diagnosis present

## 2023-10-16 DIAGNOSIS — I509 Heart failure, unspecified: Secondary | ICD-10-CM | POA: Diagnosis not present

## 2023-10-16 DIAGNOSIS — J181 Lobar pneumonia, unspecified organism: Secondary | ICD-10-CM | POA: Diagnosis not present

## 2023-10-16 DIAGNOSIS — Z9841 Cataract extraction status, right eye: Secondary | ICD-10-CM

## 2023-10-16 DIAGNOSIS — R6520 Severe sepsis without septic shock: Secondary | ICD-10-CM | POA: Diagnosis present

## 2023-10-16 DIAGNOSIS — Z1612 Extended spectrum beta lactamase (ESBL) resistance: Secondary | ICD-10-CM | POA: Diagnosis not present

## 2023-10-16 DIAGNOSIS — I7121 Aneurysm of the ascending aorta, without rupture: Secondary | ICD-10-CM | POA: Diagnosis not present

## 2023-10-16 DIAGNOSIS — E039 Hypothyroidism, unspecified: Secondary | ICD-10-CM | POA: Diagnosis not present

## 2023-10-16 DIAGNOSIS — N184 Chronic kidney disease, stage 4 (severe): Secondary | ICD-10-CM | POA: Diagnosis not present

## 2023-10-16 DIAGNOSIS — J9601 Acute respiratory failure with hypoxia: Secondary | ICD-10-CM | POA: Diagnosis present

## 2023-10-16 DIAGNOSIS — N400 Enlarged prostate without lower urinary tract symptoms: Secondary | ICD-10-CM | POA: Diagnosis present

## 2023-10-16 DIAGNOSIS — I5033 Acute on chronic diastolic (congestive) heart failure: Secondary | ICD-10-CM | POA: Diagnosis present

## 2023-10-16 DIAGNOSIS — Z87891 Personal history of nicotine dependence: Secondary | ICD-10-CM

## 2023-10-16 DIAGNOSIS — H9190 Unspecified hearing loss, unspecified ear: Secondary | ICD-10-CM | POA: Diagnosis present

## 2023-10-16 DIAGNOSIS — R0602 Shortness of breath: Secondary | ICD-10-CM | POA: Diagnosis not present

## 2023-10-16 DIAGNOSIS — Z96611 Presence of right artificial shoulder joint: Secondary | ICD-10-CM | POA: Diagnosis not present

## 2023-10-16 DIAGNOSIS — J918 Pleural effusion in other conditions classified elsewhere: Secondary | ICD-10-CM | POA: Diagnosis not present

## 2023-10-16 DIAGNOSIS — Z8616 Personal history of COVID-19: Secondary | ICD-10-CM

## 2023-10-16 DIAGNOSIS — E782 Mixed hyperlipidemia: Secondary | ICD-10-CM | POA: Diagnosis not present

## 2023-10-16 DIAGNOSIS — Z87442 Personal history of urinary calculi: Secondary | ICD-10-CM

## 2023-10-16 DIAGNOSIS — D61818 Other pancytopenia: Secondary | ICD-10-CM | POA: Diagnosis present

## 2023-10-16 DIAGNOSIS — Z9889 Other specified postprocedural states: Secondary | ICD-10-CM

## 2023-10-16 DIAGNOSIS — G3184 Mild cognitive impairment, so stated: Secondary | ICD-10-CM | POA: Diagnosis not present

## 2023-10-16 DIAGNOSIS — Z85820 Personal history of malignant melanoma of skin: Secondary | ICD-10-CM

## 2023-10-16 DIAGNOSIS — Z8673 Personal history of transient ischemic attack (TIA), and cerebral infarction without residual deficits: Secondary | ICD-10-CM

## 2023-10-16 DIAGNOSIS — S066XAA Traumatic subarachnoid hemorrhage with loss of consciousness status unknown, initial encounter: Secondary | ICD-10-CM | POA: Diagnosis present

## 2023-10-16 DIAGNOSIS — Z833 Family history of diabetes mellitus: Secondary | ICD-10-CM

## 2023-10-16 DIAGNOSIS — Z87820 Personal history of traumatic brain injury: Secondary | ICD-10-CM

## 2023-10-16 DIAGNOSIS — N179 Acute kidney failure, unspecified: Secondary | ICD-10-CM | POA: Diagnosis present

## 2023-10-16 DIAGNOSIS — R918 Other nonspecific abnormal finding of lung field: Secondary | ICD-10-CM | POA: Diagnosis not present

## 2023-10-16 DIAGNOSIS — I4819 Other persistent atrial fibrillation: Secondary | ICD-10-CM | POA: Diagnosis not present

## 2023-10-16 DIAGNOSIS — J151 Pneumonia due to Pseudomonas: Secondary | ICD-10-CM | POA: Diagnosis present

## 2023-10-16 DIAGNOSIS — Z9842 Cataract extraction status, left eye: Secondary | ICD-10-CM

## 2023-10-16 DIAGNOSIS — B9620 Unspecified Escherichia coli [E. coli] as the cause of diseases classified elsewhere: Secondary | ICD-10-CM | POA: Diagnosis not present

## 2023-10-16 DIAGNOSIS — R58 Hemorrhage, not elsewhere classified: Secondary | ICD-10-CM | POA: Diagnosis not present

## 2023-10-16 DIAGNOSIS — D649 Anemia, unspecified: Secondary | ICD-10-CM | POA: Diagnosis present

## 2023-10-16 DIAGNOSIS — D696 Thrombocytopenia, unspecified: Secondary | ICD-10-CM | POA: Diagnosis not present

## 2023-10-16 DIAGNOSIS — E875 Hyperkalemia: Secondary | ICD-10-CM | POA: Diagnosis present

## 2023-10-16 DIAGNOSIS — Z886 Allergy status to analgesic agent status: Secondary | ICD-10-CM

## 2023-10-16 DIAGNOSIS — Z452 Encounter for adjustment and management of vascular access device: Secondary | ICD-10-CM | POA: Diagnosis not present

## 2023-10-16 DIAGNOSIS — R0902 Hypoxemia: Secondary | ICD-10-CM | POA: Diagnosis not present

## 2023-10-16 DIAGNOSIS — R7881 Bacteremia: Principal | ICD-10-CM

## 2023-10-16 DIAGNOSIS — D7581 Myelofibrosis: Secondary | ICD-10-CM | POA: Diagnosis not present

## 2023-10-16 DIAGNOSIS — R059 Cough, unspecified: Secondary | ICD-10-CM | POA: Insufficient documentation

## 2023-10-16 DIAGNOSIS — Z79890 Hormone replacement therapy (postmenopausal): Secondary | ICD-10-CM | POA: Diagnosis not present

## 2023-10-16 DIAGNOSIS — B965 Pseudomonas (aeruginosa) (mallei) (pseudomallei) as the cause of diseases classified elsewhere: Secondary | ICD-10-CM | POA: Diagnosis not present

## 2023-10-16 DIAGNOSIS — I110 Hypertensive heart disease with heart failure: Secondary | ICD-10-CM | POA: Diagnosis not present

## 2023-10-16 DIAGNOSIS — K219 Gastro-esophageal reflux disease without esophagitis: Secondary | ICD-10-CM | POA: Diagnosis present

## 2023-10-16 DIAGNOSIS — I130 Hypertensive heart and chronic kidney disease with heart failure and stage 1 through stage 4 chronic kidney disease, or unspecified chronic kidney disease: Secondary | ICD-10-CM | POA: Diagnosis present

## 2023-10-16 DIAGNOSIS — Z9181 History of falling: Secondary | ICD-10-CM

## 2023-10-16 DIAGNOSIS — R531 Weakness: Secondary | ICD-10-CM

## 2023-10-16 DIAGNOSIS — Z85828 Personal history of other malignant neoplasm of skin: Secondary | ICD-10-CM

## 2023-10-16 DIAGNOSIS — D72819 Decreased white blood cell count, unspecified: Secondary | ICD-10-CM | POA: Diagnosis not present

## 2023-10-16 LAB — TROPONIN I (HIGH SENSITIVITY)
Troponin I (High Sensitivity): 56 ng/L — ABNORMAL HIGH (ref <18)
Troponin I (High Sensitivity): 52 ng/L — ABNORMAL HIGH (ref <18)

## 2023-10-16 LAB — CBC WITH DIFFERENTIAL/PLATELET
Abs Immature Granulocytes: 0.02 10*3/uL (ref 0.00–0.07)
Basophils Absolute: 0 10*3/uL (ref 0.0–0.1)
Basophils Relative: 1 %
Eosinophils Absolute: 0 10*3/uL (ref 0.0–0.5)
Eosinophils Relative: 0 %
HCT: 25.8 % — ABNORMAL LOW (ref 39.0–52.0)
Hemoglobin: 8 g/dL — ABNORMAL LOW (ref 13.0–17.0)
Immature Granulocytes: 1 %
Lymphocytes Relative: 17 %
Lymphs Abs: 0.3 10*3/uL — ABNORMAL LOW (ref 0.7–4.0)
MCH: 28.9 pg (ref 26.0–34.0)
MCHC: 31 g/dL (ref 30.0–36.0)
MCV: 93.1 fL (ref 80.0–100.0)
Monocytes Absolute: 0.1 10*3/uL (ref 0.1–1.0)
Monocytes Relative: 5 %
Neutro Abs: 1.5 10*3/uL — ABNORMAL LOW (ref 1.7–7.7)
Neutrophils Relative %: 76 %
Platelets: 76 10*3/uL — ABNORMAL LOW (ref 150–400)
RBC: 2.77 MIL/uL — ABNORMAL LOW (ref 4.22–5.81)
RDW: 18.3 % — ABNORMAL HIGH (ref 11.5–15.5)
Smear Review: NORMAL
WBC: 2 10*3/uL — ABNORMAL LOW (ref 4.0–10.5)
nRBC: 0 % (ref 0.0–0.2)

## 2023-10-16 LAB — COMPREHENSIVE METABOLIC PANEL WITH GFR
ALT: 17 U/L (ref 0–44)
AST: 24 U/L (ref 15–41)
Albumin: 2.3 g/dL — ABNORMAL LOW (ref 3.5–5.0)
Alkaline Phosphatase: 65 U/L (ref 38–126)
Anion gap: 9 (ref 5–15)
BUN: 58 mg/dL — ABNORMAL HIGH (ref 8–23)
CO2: 23 mmol/L (ref 22–32)
Calcium: 7.5 mg/dL — ABNORMAL LOW (ref 8.9–10.3)
Chloride: 105 mmol/L (ref 98–111)
Creatinine, Ser: 3.7 mg/dL — ABNORMAL HIGH (ref 0.61–1.24)
GFR, Estimated: 16 mL/min — ABNORMAL LOW (ref >60)
Glucose, Bld: 90 mg/dL (ref 70–99)
Potassium: 5.5 mmol/L — ABNORMAL HIGH (ref 3.5–5.1)
Sodium: 137 mmol/L (ref 135–145)
Total Bilirubin: 1.7 mg/dL — ABNORMAL HIGH (ref 0.0–1.2)
Total Protein: 5.1 g/dL — ABNORMAL LOW (ref 6.5–8.1)

## 2023-10-16 LAB — RESP PANEL BY RT-PCR (RSV, FLU A&B, COVID)  RVPGX2
Influenza A by PCR: NEGATIVE
Influenza B by PCR: NEGATIVE
Resp Syncytial Virus by PCR: NEGATIVE
SARS Coronavirus 2 by RT PCR: NEGATIVE

## 2023-10-16 LAB — LACTIC ACID, PLASMA: Lactic Acid, Venous: 1.5 mmol/L (ref 0.5–1.9)

## 2023-10-16 LAB — MAGNESIUM: Magnesium: 1.5 mg/dL — ABNORMAL LOW (ref 1.7–2.4)

## 2023-10-16 LAB — CK: Total CK: 77 U/L (ref 49–397)

## 2023-10-16 LAB — BRAIN NATRIURETIC PEPTIDE: B Natriuretic Peptide: 1435.7 pg/mL — ABNORMAL HIGH (ref 0.0–100.0)

## 2023-10-16 MED ORDER — SODIUM CHLORIDE 0.9 % IV SOLN
500.0000 mg | Freq: Once | INTRAVENOUS | Status: AC
  Administered 2023-10-16: 500 mg via INTRAVENOUS
  Filled 2023-10-16: qty 5

## 2023-10-16 MED ORDER — SODIUM CHLORIDE 0.9 % IV SOLN
500.0000 mg | INTRAVENOUS | Status: DC
  Filled 2023-10-16: qty 5

## 2023-10-16 MED ORDER — SENNOSIDES-DOCUSATE SODIUM 8.6-50 MG PO TABS: 1.0000 | ORAL_TABLET | Freq: Every evening | ORAL | Status: DC | PRN

## 2023-10-16 MED ORDER — SODIUM CHLORIDE 0.9 % IV SOLN
2.0000 g | INTRAVENOUS | Status: DC
  Administered 2023-10-17: 2 g via INTRAVENOUS
  Filled 2023-10-16 (×2): qty 12.5

## 2023-10-16 MED ORDER — MAGNESIUM SULFATE IN D5W 1-5 GM/100ML-% IV SOLN
1.0000 g | Freq: Once | INTRAVENOUS | Status: AC
  Administered 2023-10-16: 1 g via INTRAVENOUS
  Filled 2023-10-16: qty 100

## 2023-10-16 MED ORDER — ACETAMINOPHEN 325 MG PO TABS: 650.0000 mg | ORAL_TABLET | Freq: Four times a day (QID) | ORAL | Status: AC | PRN

## 2023-10-16 MED ORDER — VANCOMYCIN HCL 1500 MG/300ML IV SOLN
1500.0000 mg | Freq: Once | INTRAVENOUS | Status: AC
  Administered 2023-10-16: 1500 mg via INTRAVENOUS
  Filled 2023-10-16: qty 300

## 2023-10-16 MED ORDER — ONDANSETRON HCL 4 MG PO TABS: 4.0000 mg | ORAL_TABLET | Freq: Four times a day (QID) | ORAL | Status: AC | PRN

## 2023-10-16 MED ORDER — VANCOMYCIN VARIABLE DOSE PER UNSTABLE RENAL FUNCTION (PHARMACIST DOSING)
Status: DC
  Filled 2023-10-16: qty 1

## 2023-10-16 MED ORDER — HYDROCOD POLI-CHLORPHE POLI ER 10-8 MG/5ML PO SUER
5.0000 mL | Freq: Every evening | ORAL | Status: AC | PRN
  Administered 2023-10-18: 5 mL via ORAL
  Filled 2023-10-16: qty 5

## 2023-10-16 MED ORDER — ONDANSETRON HCL 4 MG/2ML IJ SOLN
4.0000 mg | Freq: Four times a day (QID) | INTRAMUSCULAR | Status: AC | PRN
  Administered 2023-10-17: 4 mg via INTRAVENOUS
  Filled 2023-10-16: qty 2

## 2023-10-16 MED ORDER — ACETAMINOPHEN 650 MG RE SUPP: 650.0000 mg | Freq: Four times a day (QID) | RECTAL | Status: AC | PRN

## 2023-10-16 MED ORDER — FUROSEMIDE 10 MG/ML IJ SOLN
40.0000 mg | Freq: Once | INTRAMUSCULAR | Status: AC
  Administered 2023-10-16: 40 mg via INTRAVENOUS
  Filled 2023-10-16: qty 4

## 2023-10-16 MED ORDER — LEVOTHYROXINE SODIUM 100 MCG PO TABS
100.0000 ug | ORAL_TABLET | Freq: Every day | ORAL | Status: DC
  Administered 2023-10-17 – 2023-10-24 (×8): 100 ug via ORAL
  Filled 2023-10-16 (×8): qty 1

## 2023-10-16 MED ORDER — ALBUTEROL SULFATE (2.5 MG/3ML) 0.083% IN NEBU
10.0000 mg | INHALATION_SOLUTION | Freq: Once | RESPIRATORY_TRACT | Status: AC
  Administered 2023-10-16: 10 mg via RESPIRATORY_TRACT
  Filled 2023-10-16: qty 12

## 2023-10-16 MED ORDER — VANCOMYCIN HCL IN DEXTROSE 1-5 GM/200ML-% IV SOLN: 1000.0000 mg | Freq: Once | INTRAVENOUS | Status: DC

## 2023-10-16 MED ORDER — SODIUM CHLORIDE 0.9 % IV SOLN
2.0000 g | Freq: Once | INTRAVENOUS | Status: AC
  Administered 2023-10-16: 2 g via INTRAVENOUS
  Filled 2023-10-16: qty 12.5

## 2023-10-16 MED ORDER — IPRATROPIUM-ALBUTEROL 0.5-2.5 (3) MG/3ML IN SOLN: 3.0000 mL | Freq: Four times a day (QID) | RESPIRATORY_TRACT | Status: AC | PRN

## 2023-10-16 MED ORDER — GUAIFENESIN 100 MG/5ML PO LIQD
5.0000 mL | Freq: Four times a day (QID) | ORAL | Status: AC | PRN
  Administered 2023-10-18 – 2023-10-19 (×2): 5 mL via ORAL
  Filled 2023-10-16 (×2): qty 10

## 2023-10-16 MED ORDER — SODIUM CHLORIDE 0.9 % IV SOLN: INTRAVENOUS | Status: DC

## 2023-10-16 NOTE — Assessment & Plan Note (Signed)
 Guaifenesin  5 mL p.o. every 6 hours as needed to loosen phlegm, and cough during the day Tussionex 5 mL p.o. nightly as needed for cough at night

## 2023-10-16 NOTE — Assessment & Plan Note (Addendum)
 In setting of acute kidney injury, check CK on admission

## 2023-10-16 NOTE — ED Provider Notes (Signed)
 [facility] Provider Note    Event Date/Time   First MD Initiated Contact with Patient 10/16/23 1020     (approximate)   History   Chief Complaint Shortness of Breath   HPI  [name] is a 83 y.o. 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  Patient reports that he has been increasingly short of breath over the past 3 days with a productive cough.  He denies any fevers and has not had any pain in his chest, also denies any pain or swelling in his legs.  He states that symptoms are similar to when he was dealing with a fluid collection in his right lung that needed to be drained.     Physical Exam   Triage Vital Signs: ED Triage Vitals  Encounter Vitals Group     BP 10/16/23 1024 121/86     Systolic BP Percentile --      Diastolic BP Percentile --      Pulse Rate 10/16/23 1024 93     Resp 10/16/23 1024 20     Temp --      Temp src --      SpO2 10/16/23 1024 92 %     Weight --      Height --      Head Circumference --      Peak Flow --      Pain Score 10/16/23 1025 0     Pain Loc --      Pain Education --      Exclude from Growth Chart --     Most recent vital signs: Vitals:   10/16/23 1330 10/16/23 1455  BP: 131/82   Pulse: 99   Resp: (!) 25   Temp:  98 F (36.7 C)  SpO2: 95%     Constitutional: Alert and oriented. Eyes: Conjunctivae are normal. Head: Atraumatic. Nose: No congestion/rhinnorhea. Mouth/Throat: Mucous membranes are moist.  Cardiovascular: Normal rate, regular rhythm. Grossly normal heart sounds.  2+ radial pulses bilaterally. Respiratory: Normal respiratory effort.  No retractions. Lungs sounds diminished with significant crackles in the right lung, left side clear. Gastrointestinal: Soft and nontender. No distention. Musculoskeletal: No lower extremity  tenderness nor edema.  Neurologic:  Normal speech and language. No gross focal neurologic deficits are appreciated.    ED Results / Procedures / Treatments   Labs (all labs ordered are listed, but only abnormal results are displayed) Labs Reviewed  CBC WITH DIFFERENTIAL/PLATELET - Abnormal; Notable for the following components:      Result Value   WBC 2.0 (*)    RBC 2.77 (*)    Hemoglobin 8.0 (*)    HCT 25.8 (*)    RDW 18.3 (*)    Platelets 76 (*)    Neutro Abs 1.5 (*)    Lymphs Abs 0.3 (*)    All other components within normal limits  COMPREHENSIVE METABOLIC PANEL WITH GFR - Abnormal; Notable for the following components:   Potassium 5.5 (*)    BUN 58 (*)    Creatinine, Ser 3.70 (*)    Calcium  7.5 (*)    Total Protein 5.1 (*)    Albumin  2.3 (*)    Total Bilirubin 1.7 (*)    GFR, Estimated 16 (*)    All other components within normal limits  BRAIN NATRIURETIC PEPTIDE - Abnormal; Notable for the following components:  B Natriuretic Peptide 1,435.7 (*)    All other components within normal limits  MAGNESIUM  - Abnormal; Notable for the following components:   Magnesium  1.5 (*)    All other components within normal limits  TROPONIN I (HIGH SENSITIVITY) - Abnormal; Notable for the following components:   Troponin I (High Sensitivity) 56 (*)    All other components within normal limits  TROPONIN I (HIGH SENSITIVITY) - Abnormal; Notable for the following components:   Troponin I (High Sensitivity) 52 (*)    All other components within normal limits  RESP PANEL BY RT-PCR (RSV, FLU A&B, COVID)  RVPGX2  CULTURE, BLOOD (ROUTINE X 2)  CULTURE, BLOOD (ROUTINE X 2)  LACTIC ACID, PLASMA  CK  LACTIC ACID, PLASMA  POTASSIUM     EKG  ED ECG REPORT I, Twilla Galea, the attending physician, personally viewed and interpreted this ECG.   Date: 10/16/2023  EKG Time: 10:27  Rate: 100  Rhythm: atrial fibrillation  Axis: Normal  Intervals:Prolonged QT  ST&T Change:  None  RADIOLOGY Chest x-ray reviewed and interpreted by me with right-sided effusion.  PROCEDURES:  Critical Care performed: Yes, see critical care procedure note(s)  .Critical Care  Performed by: Twilla Galea, MD Authorized by: Twilla Galea, MD   Critical care provider statement:    Critical care time (minutes):  30   Critical care time was exclusive of:  Separately billable procedures and treating other patients and teaching time   Critical care was necessary to treat or prevent imminent or life-threatening deterioration of the following conditions:  Respiratory failure   Critical care was time spent personally by me on the following activities:  Development of treatment plan with patient or surrogate, discussions with consultants, evaluation of patient's response to treatment, examination of patient, ordering and review of laboratory studies, ordering and review of radiographic studies, ordering and performing treatments and interventions, pulse oximetry, re-evaluation of patient's condition and review of old charts   I assumed direction of critical care for this patient from another provider in my specialty: no     Care discussed with: admitting provider      MEDICATIONS ORDERED IN ED: Medications  levothyroxine  (SYNTHROID ) tablet 100 mcg (has no administration in time range)  acetaminophen  (TYLENOL ) tablet 650 mg (has no administration in time range)    Or  acetaminophen  (TYLENOL ) suppository 650 mg (has no administration in time range)  ondansetron  (ZOFRAN ) tablet 4 mg (has no administration in time range)    Or  ondansetron  (ZOFRAN ) injection 4 mg (has no administration in time range)  azithromycin (ZITHROMAX) 500 mg in sodium chloride  0.9 % 250 mL IVPB (has no administration in time range)  senna-docusate (Senokot-S) tablet 1 tablet (has no administration in time range)  vancomycin  (VANCOREADY) IVPB 1500 mg/300 mL (1,500 mg Intravenous New Bag/Given 10/16/23 1454)   ceFEPIme  (MAXIPIME ) 2 g in sodium chloride  0.9 % 100 mL IVPB (has no administration in time range)  vancomycin  variable dose per unstable renal function (pharmacist dosing) (has no administration in time range)  ipratropium-albuterol  (DUONEB) 0.5-2.5 (3) MG/3ML nebulizer solution 3 mL (has no administration in time range)  guaiFENesin  (ROBITUSSIN) 100 MG/5ML liquid 5 mL (has no administration in time range)  chlorpheniramine-HYDROcodone  (TUSSIONEX) 10-8 MG/5ML suspension 5 mL (has no administration in time range)  0.9 %  sodium chloride  infusion (has no administration in time range)  magnesium  sulfate IVPB 1 g 100 mL (has no administration in time range)  furosemide  (LASIX ) injection 40 mg (40 mg Intravenous  Given 10/16/23 1301)  ceFEPIme  (MAXIPIME ) 2 g in sodium chloride  0.9 % 100 mL IVPB (0 g Intravenous Stopped 10/16/23 1341)  azithromycin (ZITHROMAX) 500 mg in sodium chloride  0.9 % 250 mL IVPB (0 mg Intravenous Stopped 10/16/23 1518)  albuterol  (PROVENTIL ) (2.5 MG/3ML) 0.083% nebulizer solution 10 mg (10 mg Nebulization Given 10/16/23 1528)     IMPRESSION / MDM / ASSESSMENT AND PLAN / ED COURSE  I reviewed the triage vital signs and the nursing notes.                              83 y.o. 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  Patient's presentation is most consistent with acute presentation with potential threat to life or bodily function.  Differential diagnosis includes, but is not limited to, ACS, PE, pneumonia, pneumothorax, bronchitis, COVID-19, influenza, pleural effusion, anemia, electrolyte abnormality, AKI.  Patient chronically ill-appearing but nontoxic and in no acute distress, vital signs remarkable for hypoxia of around 90% on room air, placed on 2 L nasal cannula with  improvement.  He is not in any respiratory distress but does have significant crackles with diminished breath sounds on the right concerning for recurrent effusion.  EKG shows no evidence of arrhythmia or ischemia, labs and chest x-ray are pending at this time.  Chest x-ray is concerning for recurrent right-sided effusion with possible associated pulmonary edema as well as infection.  Labs with leukopenia and mild left shift, anemia stable compared to previous.  Patient with AKI but no acute electrolyte abnormality, troponin elevated but stable on recheck, BNP elevated consistent with CHF.  We will diurese with IV Lasix  and start on IV antibiotics.  Case discussed with hospitalist for admission.      FINAL CLINICAL IMPRESSION(S) / ED DIAGNOSES   Final diagnoses:  Acute respiratory failure with hypoxia (HCC)  Acute congestive heart failure, unspecified heart failure type (HCC)  Pneumonia of right lower lobe due to infectious organism     Rx / DC Orders   ED Discharge Orders     None        Note:  This document was prepared using Dragon voice recognition software and may include unintentional dictation errors.   Twilla Galea, MD 10/16/23 [phone]

## 2023-10-16 NOTE — ED Notes (Signed)
 Christian Arthurs NP states she is fine with patients heart rate being 130 because he is trying to "compensate." Christian Arthurs NP did not reccommended any changes in level of patients care at this time. Ouma reports if patients heart rate continues to increase to reach back out to her.

## 2023-10-16 NOTE — ED Notes (Signed)
 This RN gave report to Leonie Rams and performed care handoff. Call light in reach, bed wheels locked, side rails raised, pt updated on plan of care. Rounding completed.

## 2023-10-16 NOTE — Consult Note (Signed)
 CODE SEPSIS - PHARMACY COMMUNICATION  **Broad Spectrum Antibiotics should be administered within 1 hour of Sepsis diagnosis**  Time Code Sepsis Called/Page Received: 1242  Antibiotics Ordered: Cefepime , vancomycin , cefepime   Time of 1st antibiotic administration: 1303  Additional action taken by pharmacy: N/A  Ramonita Burow ,PharmD Clinical Pharmacist  10/16/2023  1:15 PM

## 2023-10-16 NOTE — Assessment & Plan Note (Signed)
 Patient has increased respiration rate, leukopenia, and source of infection is right lower lobe pneumonia Blood cultures x 2 have been ordered and pending collection Continue with azithromycin 500 mg IV daily to complete a 5-day course, cefepime  and vancomycin  per pharmacy Maintain MAP > 65

## 2023-10-16 NOTE — ED Triage Notes (Signed)
 SOB Friday. patient waited over the weekend to call his primary to come in to the ED. Patient has audible wheezing and wet cough. Patient has a blood disorder. When the patient was last at the hospital they took fluid off of his lungs in April.  85% on RA. 103, 142/78 CBG: 104

## 2023-10-16 NOTE — Assessment & Plan Note (Signed)
 At baseline hemoglobin

## 2023-10-16 NOTE — Telephone Encounter (Signed)
 Chief Complaint: breathing difficulty Symptoms: wheezing, cough, weakness, loss of appetite Frequency: since he was discharged 5/6 Disposition: [x] ED /[] Urgent Care (no appt availability in office) / [] Appointment(In office/virtual)/ []  [facility]/ [] Home Care/ [] Refused Recommended Disposition /[] [facility]/ []  Follow-up with PCP Additional Notes: RN spoke to pt's wife [name]. Pt was discharged from the hospital 5/6. Since then, pt has been worsening. [name] states pt has difficulty breathing, wheezing, and a cough with green sputum. Pt is weak and won't get out of bed today. Pt has been drinking fluids but has had loss of appetite. Nephew checked his O2 yesterday and it was 82. [name] checked O2 today on the phone with the RN and it was 83-84 with a HR of 106. Pt not on O2. Pt seems slow to respond to Edna's questions and has difficulty explaining how he feels. RN asked pt if he has CP, pt states "I don't know." RN called 911 and stayed on the phone with [name] until EMS arrived.     Copied from CRM [id number]. Topic: Clinical - Red Word Triage >> Oct 16, 2023  9:06 AM Lizabeth Riggs wrote: Red Word that prompted transfer to Nurse Triage:  Every since he came home from hospital, he is not eating, drinks water , not getting out of bed, he will tell wife what is symptoms are; nephew checks is vital signs; O2 was 82 yesterday. Wife gives him breathing treatments. Reason for Disposition  SEVERE difficulty breathing (e.g., struggling for each breath, speaks in single words)  Answer Assessment - Initial Assessment Questions 1. RESPIRATORY STATUS: "Describe your breathing?" (e.g., wheezing, shortness of breath, unable to speak, severe coughing)      Wife states pt has a cough, wheezing, trouble breathing 2. ONSET: "When did this breathing problem begin?"      "Ever since he was discharged" on 5/6 3. PATTERN "Does the difficult breathing come and go, or has it been constant since it started?"       N/a  4. SEVERITY: "How bad is your breathing?" (e.g., mild, moderate, severe)    - MILD: No SOB at rest, mild SOB with walking, speaks normally in sentences, can lie down, no retractions, pulse < 100.    - MODERATE: SOB at rest, SOB with minimal exertion and prefers to sit, cannot lie down flat, speaks in phrases, mild retractions, audible wheezing, pulse 100-120.    - SEVERE: Very SOB at rest, speaks in single words, struggling to breathe, sitting hunched forward, retractions, pulse > 120      Cough, wheezing 5. RECURRENT SYMPTOM: "Have you had difficulty breathing before?" If Yes, ask: "When was the last time?" and "What happened that time?"      Since he was discharged 5/6 6. CARDIAC HISTORY: "Do you have any history of heart disease?" (e.g., heart attack, angina, bypass surgery, angioplasty)      STEMI, Afib, HF 7. LUNG HISTORY: "Do you have any history of lung disease?"  (e.g., pulmonary embolus, asthma, emphysema)     Pleural effusion 8. CAUSE: "What do you think is causing the breathing problem?"      Not sure - was discharged from the hospital 5/6 9. OTHER SYMPTOMS: "Do you have any other symptoms? (e.g., dizziness, runny nose, cough, chest pain, fever)     Cough, wheezing. "I've got him up twice but that was beside the bed so I can change him, today he won't get up." Drinking plenty but will not eat. Not getting out of  bed. Wife giving nebulizer and inhaler. "He won't eat, I offered him his favorite things." O2 was 82 yesterday - does not wear O2. No BM since 5/6. "Having trouble breathing", wheezing. Thick, green sputum.  Pt "does not know" if his heart hurts. 10. O2 SATURATION MONITOR:  "Do you use an oxygen saturation monitor (pulse oximeter) at home?" If Yes, ask: "What is your reading (oxygen level) today?" "What is your usual oxygen saturation reading?" (e.g., 95%) O2 was 82 yesterday, 83-84 today on the phone. HR 106.  Protocols used: Breathing Difficulty-A-AH

## 2023-10-16 NOTE — Assessment & Plan Note (Addendum)
 Continue with antibiotic per above DuoNebs every 6 hours as needed for wheezing, shortness of breath 3 days ordered

## 2023-10-16 NOTE — Assessment & Plan Note (Signed)
 On baseline serum creatinine of 2.99-3.5 Suspect secondary to poor p.o. intake Patient is intravascularly depleted Sodium chloride  infusion at 100 mL/h, 1 day ordered Recheck BMP in the a.m.

## 2023-10-16 NOTE — Hospital Course (Addendum)
 Hospital course / significant events:   HPI: [name] is a 83 year old male with history of traumatic subarachnoid hemorrhage, myelofibrosis, symptomatic anemia, persistent atrial fibrillation, mild cognitive impairment, hypothyroid, GERD, BPH, hyperlipidemia, hypothyroid, who presents emergency department for chief concerns of cough and wheezing.  05/12: admitted to hospitalist for sepsis d/t RLL pneumonia, AKI, hyperkalemia, HFpEF exacerbation. Started abx, diuresis  05/13: diuresing, (+)Bcx Pseudomonas, continue abx, 05/14: Cr slowly improving. Briefly Afib RVR but this improved w/ diltiazem  IV x1 and increased beta blocker, resumed home amiodarone . Per ID, plan repeat BCx following 48h abx, continue cefepime   05/15: HR low overnight, soft BP - reduced beta blocker. Repeat BCx drawn today.  05/16: BCx NG thus far. Planning for home w/ HH and IV abx to continue w/ cefepime . Per nephrology - ok for midline or PICC despite CrCl 05/17: orders in for PICC, TOC working on [facility] (should be able to start tomorrow) and IV abx, DME  05/18: unable to place PICC, will need to involve IR tomorrow for PICC/midline or other access prior to dc home on IV abx. ESBL (+)Sputum culture, per ID will repeat CXR  05/19: central line w/ IR placed today. CXR improved aeration but persistent R pleural effusion appears worse on personal review of images - plan thoracentesis given recurrence, IR unable to do this today. IV abx at home confirmed, can go hopefully tomorrow following thoracentesis.    Consultants:  Infectious disease  Palliative care   Procedures/Surgeries: 10/23/23 tunneled cath placement to R internal jugular       ASSESSMENT & PLAN:   Severe sepsis with acute organ dysfunction - improved  Patient has increased respiration rate, leukopenia, and source of infection is right lower lobe pneumonia and bacteremia Treat underlying cause(s) below    Acute hypoxemic respiratory failure - resolved   Due to PNA and CHF.   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 monitor BMP Per nephrology - ok for midline or PICC despite CrCl   Hyperkalemia - resolved  In setting of acute kidney injury Monitor BMP   Hypomagnesemia monitor and supplement PRN   Weakness Pt declining SNF rehab   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 Randy Francis Palliative care following       N/a based on BMI: There is no height or weight on file to calculate BMI.. Significantly low or high BMI is associated with higher medical risk.  Underweight - under 18  overweight - 25 to 29 obese - 30 or more Class 1 obesity: BMI of 30.0 to 34 Class 2 obesity: BMI of 35.0 to 39 Class 3 obesity: BMI of 40.0 to 49 Super  Morbid Obesity: BMI 50-59 Super-super Morbid Obesity: BMI 60+ Healthy nutrition and physical activity advised as adjunct to other disease management and risk reduction treatments    DVT prophylaxis: no anticoagulation d/t SAH IV fluids: no continuous IV fluids  Nutrition: cardiac diet  Central lines / other devices:  none  Code Status: FULL CODE - see palliative note 10/18/23  ACP documentation reviewed: none on file in [facility]  TOC needs: HH / IV abx. Will need to arrange iV abx outpatient and DME Medical barriers to dispo: once get thoracentesis tomorrow hopefully will be able to discharge home

## 2023-10-16 NOTE — Assessment & Plan Note (Signed)
Home levothyroxine 100 mcg daily resumed

## 2023-10-16 NOTE — H&P (Addendum)
 History and Physical   [name] UJW:[id number] DOB: [birth year] DOA: 10/16/2023  PCP: Christian Dupre, DO Outpatient Specialists: Dr. Randy Francis, hematology Patient coming from: Home via EMS  I have personally briefly reviewed patient's old medical records in [facility] EMR.  Chief Concern: cough, wheezing  HPI: [name] is a 83 year old male with history of traumatic subarachnoid hemorrhage, myelofibrosis, symptomatic anemia, persistent atrial fibrillation, mild cognitive impairment, hypothyroid, GERD, BPH, hyperlipidemia, hypothyroid, who presents emergency department for chief concerns of cough and wheezing.  Vitals in the ED showed temperature of 98, rr 20, hr 93, blood pressure 121/86, SpO2 92% on 2 L nasal cannula.  Serum sodium is 137, potassium 5.5, chloride 105, bicarb 23, BUN of 58, serum creatinine 3.70, EGFR 16, nonfasting blood glucose 90, WBC 2.0, hemoglobin 8.0, platelets of 76.  ED treatment: Furosemide  40 mg IV one-time dose, azithromycin 500 mg IV one-time dose, cefepime  and vancomycin  per pharmacy. -------------------------------------- At bedside, patient was able to tell me his first and last name, age, location, current calendar year.  He reports that he has been coughing that feels wet though not productive for the past 3 days.  He endorses associated shortness of breath for 3 days and nausea for 3 days.  He denies vomiting.  He endorses generalized weakness for the last 3 days and has not been able to get out of bed.  He denies trauma to his person, chest pain, abdominal pain, dysuria, hematuria, diarrhea.  He denies blood in his stool.  He denies known sick contacts.  He endorses trying to drink and eat however he admits that his appetite has not been very good and likely has not been drinking enough water .  Social history: He lives at home with his wife.  He denies tobacco, EtOH, recreational drug use.  He is retired.  ROS: Constitutional: no weight  change, no fever ENT/Mouth: no sore throat, no rhinorrhea Eyes: no eye pain, no vision changes Cardiovascular: no chest pain, + dyspnea,  no edema, no palpitations Respiratory: + cough, no sputum, no wheezing Gastrointestinal: + nausea, no vomiting, no diarrhea, no constipation Genitourinary: no urinary incontinence, no dysuria, no hematuria Musculoskeletal: no arthralgias, no myalgias Skin: no skin lesions, no pruritus, Neuro: + weakness, no loss of consciousness, no syncope Psych: no anxiety, no depression, + decrease appetite Heme/Lymph: no bruising, no bleeding  ED Course: Discussed with EDP, patient requiring inpatient hospitalization for chief concerns of sepsis, secondary to right lower lobe pneumonia.  Assessment/Plan  Principal Problem:   Severe sepsis with acute organ dysfunction (HCC) Active Problems:   Hyperkalemia   Acute kidney injury (HCC)   Right lower lobe pneumonia   BPH (benign prostatic hyperplasia)   Symptomatic anemia   Mixed hyperlipidemia   Hypothyroidism   CKD (chronic kidney disease) stage 4, GFR 15-29 ml/min (HCC)   History of CVA (cerebrovascular accident)   Traumatic subarachnoid hemorrhage (HCC)   Persistent atrial fibrillation (HCC)   Mild cognitive impairment   Cough   Weakness   Hypomagnesemia   Assessment and Plan:  * Severe sepsis with acute organ dysfunction (HCC) Patient has increased respiration rate, leukopenia, and source of infection is right lower lobe pneumonia Blood cultures x 2 have been ordered and pending collection Continue with azithromycin 500 mg IV daily to complete a 5-day course, cefepime  and vancomycin  per pharmacy Maintain MAP > 65  Right lower lobe pneumonia Continue with antibiotic per above DuoNebs every 6 hours as needed for wheezing, shortness of breath  3 days ordered  Acute kidney injury (HCC) On baseline serum creatinine of 2.99-3.5 Suspect secondary to poor p.o. intake Patient is intravascularly  depleted Sodium chloride  infusion at 100 mL/h, 1 day ordered Recheck BMP in the a.m.  Hyperkalemia In setting of acute kidney injury Status post furosemide  40 mg IV one-time dose per EDP Albuterol  nebulizer 10 mg one-time dose reCheck serum potassium at 1915 on admission  Hypomagnesemia Magnesium  sulfate 1 g IV one-time dose ordered on admission Recheck magnesium  level in the a.m.  Weakness In setting of acute kidney injury, check CK on admission  Cough Guaifenesin  5 mL p.o. every 6 hours as needed to loosen phlegm, and cough during the day Tussionex 5 mL p.o. nightly as needed for cough at night  Traumatic subarachnoid hemorrhage (HCC) In April 2025, patient was discharged to medicine rehabilitation and was discharged from that service on 10/10/2023  Hypothyroidism Home levothyroxine  100 mcg daily resumed  Symptomatic anemia At baseline hemoglobin  Chart reviewed.   Hospitalization from 09/21/2023 to 09/29/2023 for fall, traumatic subarachnoid hemorrhage, and ileus.  DVT prophylaxis: TED hose; pharmacologic DVT prophylaxis not initiated on admission as patient has thrombocytopenia.  AM team to initiate pharmacologic DVT prophylaxis when the benefits outweigh the risk Code Status: Full code Diet: Heart healthy Family Communication: No.  Patient states his wife already knows. Disposition Plan: Pending clinical course Consults called: Pharmacy Admission status: PCU, inpatient  Past Medical History:  Diagnosis Date   Arthritis    Benign hypertensive renal disease    Biceps tendon rupture, right, initial encounter    COVID-19    GERD (gastroesophageal reflux disease)    Heartburn    History of kidney stones    History of retinal detachment    Hyperlipidemia    Hypertension    Hypothyroidism    Infraspinatus tendon tear, right, initial encounter    Melanoma (HCC)    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 approximately 10-15 years ago   Myelofibrosis (HCC)    Prostate  hypertrophy    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   right forearm, [facility]   Stroke (HCC) 11/2007   R brain subcortical infarct   Past Surgical History:  Procedure Laterality Date   ASPIRATION / INJECTION RENAL CYST  07/08/2015   BACK SURGERY     approx 20- 25 years ago   COLONOSCOPY     EYE SURGERY     cataract both eyes   GAS INSERTION  08/11/2011   Procedure: INSERTION OF GAS;  Surgeon: Rexene Catching, MD;  Location: [facility] OR;  Service: Ophthalmology;  Laterality: Right;  [id number]   IR BONE MARROW BIOPSY & ASPIRATION  08/03/2023   IR THORACENTESIS ASP PLEURAL SPACE W/IMG GUIDE  09/22/2023   IR THORACENTESIS ASP PLEURAL SPACE W/IMG GUIDE  10/02/2023   LEFT HEART CATH AND CORONARY ANGIOGRAPHY N/A 05/02/2023   Procedure: LEFT HEART CATH AND CORONARY ANGIOGRAPHY;  Surgeon: Sammy Crisp, MD;  Location: [facility] INVASIVE CV LAB;  Service: Cardiovascular;  Laterality: N/A;   REVERSE SHOULDER ARTHROPLASTY Right 04/13/2023   Procedure: REVERSE SHOULDER ARTHROPLASTY;  Surgeon: Elner Hahn, MD;  Location: [facility] ORS;  Service: Orthopedics;  Laterality: Right;   SCLERAL BUCKLE  08/11/2011   Procedure: SCLERAL BUCKLE;  Surgeon: Rexene Catching, MD;  Location: [facility] OR;  Service: Ophthalmology;  Laterality: Right;   VARICOSE VEIN SURGERY     Social History:  reports that he quit smoking about 13 years ago. His smoking use included cigarettes. He  has never used smokeless tobacco. He reports that he does not drink alcohol and does not use drugs.  Allergies  Allergen Reactions   Meloxicam Nausea And Vomiting   Family History  Problem Relation Age of Onset   Heart disease Father    Dementia Sister    Diabetes Son    Kidney disease Neg Hx    Prostate cancer Neg Hx    Family history: Family history reviewed and not pertinent.  Prior to Admission medications   Medication Sig Start Date End Date Taking? Authorizing Provider  acetaminophen  (TYLENOL ) 500 MG tablet Take 1,000 mg by mouth every 6  (six) hours as needed for mild pain (pain score 1-3).    [provider]  amiodarone  (PACERONE ) 200 MG tablet Take 1 tablet (200 mg total) by mouth daily. 10/09/23   Angiulli, Everlyn Hockey, PA-C  calcium  carbonate (OSCAL) 1500 (600 Ca) MG TABS tablet Take by mouth. 06/08/23   [provider]  Calcium  Citrate-Vitamin D (CITRACAL + D PO) Take 1 tablet by mouth in the morning.    [provider]  cyanocobalamin  1000 MCG tablet Take 1 tablet (1,000 mcg total) by mouth daily. 10/09/23   Angiulli, Everlyn Hockey, PA-C  DULoxetine  (CYMBALTA ) 20 MG capsule Take 1 capsule (20 mg total) by mouth at bedtime. 10/09/23   Angiulli, Everlyn Hockey, PA-C  lenalidomide  (REVLIMID ) 5 MG capsule Take 1 capsule (5 mg total) by mouth daily. Take for 21 days, then hold for 7 days. Repeat every 28 days. 09/25/23   Avonne Boettcher, MD  levothyroxine  (SYNTHROID ) 100 MCG tablet Take 1 tablet (100 mcg total) by mouth daily before breakfast. 10/09/23   Angiulli, Everlyn Hockey, PA-C  lidocaine  (LIDODERM ) 5 % Place 1 patch onto the skin daily. Remove & Discard patch within 12 hours or as directed by MD 10/09/23   Angiulli, Everlyn Hockey, PA-C  metoprolol  tartrate (LOPRESSOR ) 50 MG tablet Take 1 tablet (50 mg total) by mouth 2 (two) times daily. 10/09/23   Angiulli, Everlyn Hockey, PA-C  Multiple Vitamin (MULITIVITAMIN WITH MINERALS) TABS Take 1 tablet by mouth in the morning.    [provider]  pantoprazole  (PROTONIX ) 40 MG tablet Take 1 tablet (40 mg total) by mouth daily. 10/09/23   Angiulli, Everlyn Hockey, PA-C  QUEtiapine  (SEROQUEL ) 25 MG tablet Take 1 tablet (25 mg total) by mouth at bedtime as needed (sleep, restlessness). 10/09/23   Angiulli, Everlyn Hockey, PA-C  simvastatin  (ZOCOR ) 40 MG tablet Take 1 tablet (40 mg total) by mouth at bedtime. 10/09/23   Angiulli, Everlyn Hockey, PA-C  sodium bicarbonate  650 MG tablet Take 2 tablets (1,300 mg total) by mouth 3 (three) times daily. 10/09/23   Angiulli, Everlyn Hockey, PA-C  traMADol  (ULTRAM ) 50 MG tablet Take 1  tablet (50 mg total) by mouth every 12 (twelve) hours as needed for moderate pain (pain score 4-6). 10/09/23   Sterling Eisenmenger, PA-C   Physical Exam: Vitals:   10/16/23 1300 10/16/23 1315 10/16/23 1330 10/16/23 1455  BP: 132/76  131/82   Pulse: 98 90 99   Resp: 10 12 (!) 25   Temp:    98 F (36.7 C)  TempSrc:    Oral  SpO2: 95% 94% 95%    Constitutional: appears frail, weak Eyes: PERRL, lids and conjunctivae normal ENMT: Mucous membranes are dry. Posterior pharynx clear of any exudate or lesions. Age-appropriate dentition. Hearing appropriate Neck: normal, supple, no masses, no thyromegaly Respiratory: clear to auscultation bilaterally, no wheezing, no crackles.  Normal respiratory effort. No accessory muscle use.  Cardiovascular: Regular rate and rhythm, no murmurs / rubs / gallops. No extremity edema. 2+ pedal pulses. No carotid bruits.  Abdomen: no tenderness, no masses palpated, no hepatosplenomegaly. Bowel sounds positive.  Musculoskeletal: no clubbing / cyanosis. No joint deformity upper and lower extremities. Good ROM, no contractures, no atrophy. Normal muscle tone.  Skin: no rashes, lesions, ulcers. No induration Neurologic: Sensation intact. Strength 5/5 in all 4.  Psychiatric: Normal judgment and insight. Alert and oriented x 3. Normal mood.   EKG: independently reviewed, showing atrial flutter with a rate of 100, QTc 520  Chest x-ray on Admission: I personally reviewed and I agree with radiologist reading as below.  DG Chest Portable 1 View Result Date: 10/16/2023 CLINICAL DATA:  Shortness of breath EXAM: PORTABLE CHEST 1 VIEW COMPARISON:  Oct 06, 2023 FINDINGS: Worsening right lung infiltrates. Right lower lobe consolidation. Right small pleural effusion Prominence interstitial markings left lung. Findings could correlate with congestive changes superimposed right lower lobe pneumonia and right pleural effusion Total right shoulder arthroplasty IMPRESSION: Worsening right  lung infiltrates. Right lower lobe consolidation. Right small pleural effusion. Findings could correlate with congestive changes superimposed right lower lobe pneumonia and right pleural effusion. Electronically Signed   By: Fredrich Jefferson M.D.   On: 10/16/2023 11:38   Labs on Admission: I have personally reviewed following labs  CBC: Recent Labs  Lab 10/10/23 0510 10/16/23 1028  WBC 5.2 2.0*  NEUTROABS  --  1.5*  HGB 8.7* 8.0*  HCT 27.4* 25.8*  MCV 90.1 93.1  PLT 165 76*   Basic Metabolic Panel: Recent Labs  Lab 10/10/23 0510 10/16/23 1028 10/16/23 1031  NA 140 137  --   K 5.2* 5.5*  --   CL 109 105  --   CO2 20* 23  --   GLUCOSE 80 90  --   BUN 80* 58*  --   CREATININE 3.03* 3.70*  --   CALCIUM  6.9* 7.5*  --   MG  --   --  1.5*   GFR: Estimated Creatinine Clearance: 16 mL/min (A) (by C-G formula based on SCr of 3.7 mg/dL (H)).  Liver Function Tests: Recent Labs  Lab 10/16/23 1028  AST 24  ALT 17  ALKPHOS 65  BILITOT 1.7*  PROT 5.1*  ALBUMIN  2.3*   Urine analysis:    Component Value Date/Time   COLORURINE YELLOW 09/21/2023 1814   APPEARANCEUR CLEAR 09/21/2023 1814   APPEARANCEUR Clear 08/04/2023 1507   LABSPEC 1.019 09/21/2023 1814   PHURINE 5.0 09/21/2023 1814   GLUCOSEU NEGATIVE 09/21/2023 1814   HGBUR NEGATIVE 09/21/2023 1814   BILIRUBINUR NEGATIVE 09/21/2023 1814   BILIRUBINUR Negative 08/04/2023 1507   KETONESUR NEGATIVE 09/21/2023 1814   PROTEINUR 100 (A) 09/21/2023 1814   NITRITE NEGATIVE 09/21/2023 1814   LEUKOCYTESUR NEGATIVE 09/21/2023 1814   CRITICAL CARE Performed by: Dr. Reinhold Carbine  Total critical care time: 35 minutes  Critical care time was exclusive of separately billable procedures and treating other patients.  Critical care was necessary to treat or prevent imminent or life-threatening deterioration.  Critical care was time spent personally by me on the following activities: development of treatment plan with patient as well as  nursing, discussions with consultants, evaluation of patient's response to treatment, examination of patient, obtaining history from patient or surrogate, ordering and performing treatments and interventions, ordering and review of laboratory studies, ordering and review of radiographic studies, pulse oximetry and re-evaluation of patient's condition.  This document was prepared using Dragon Voice Recognition software and may include unintentional dictation errors.  Dr. Reinhold Carbine [facility]  If 7PM-7AM, please contact overnight-coverage provider If 7AM-7PM, please contact day attending provider [email or web address]  10/16/2023, 3:38 PM

## 2023-10-16 NOTE — Assessment & Plan Note (Signed)
 Magnesium sulfate 1 g IV one-time dose ordered on admission Recheck magnesium level in the a.m.

## 2023-10-16 NOTE — Consult Note (Signed)
 Pharmacy Antibiotic Note  [name] is a 83 y.o. male admitted on 10/16/2023 with sepsis.  Pharmacy has been consulted for vancomycin  and cefepime  dosing.  Scr 3.7 (BL ~ 3)  Plan: Give vancomycin  1500 mg IV x 1, then variable vancomycin  dosing due to unstable renal function Vancomycin  random level 5/13 @ 1400 Start cefepime  2 grams IV every 24 hours Azithromycin 500 mg IV every 24 hours per provider Follow renal function and cultures for adjustments     No data recorded.  Recent Labs  Lab 10/10/23 0510 10/16/23 1028  WBC 5.2 2.0*  CREATININE 3.03* 3.70*  LATICACIDVEN  --  1.5    Estimated Creatinine Clearance: 16 mL/min (A) (by C-G formula based on SCr of 3.7 mg/dL (H)).    Allergies  Allergen Reactions   Meloxicam Nausea And Vomiting    Antimicrobials this admission: vancomycin  5/12 >>  cefepime  5/12 >>  Azithromycin 5/12>>  Dose adjustments this admission: N/A  Microbiology results: 5/12 BCx: pending  Thank you for allowing pharmacy to be a part of this patient's care.  Ramonita Burow, PharmD 10/16/2023 1:33 PM

## 2023-10-16 NOTE — Assessment & Plan Note (Addendum)
 In setting of acute kidney injury Status post furosemide  40 mg IV one-time dose per EDP Albuterol  nebulizer 10 mg one-time dose reCheck serum potassium at 1915 on admission

## 2023-10-16 NOTE — Assessment & Plan Note (Signed)
 In April 2025, patient was discharged to medicine rehabilitation and was discharged from that service on 10/10/2023

## 2023-10-17 ENCOUNTER — Ambulatory Visit (HOSPITAL_COMMUNITY): Admitting: Physician Assistant

## 2023-10-17 DIAGNOSIS — A419 Sepsis, unspecified organism: Secondary | ICD-10-CM | POA: Diagnosis not present

## 2023-10-17 DIAGNOSIS — R6520 Severe sepsis without septic shock: Secondary | ICD-10-CM | POA: Diagnosis not present

## 2023-10-17 LAB — BLOOD CULTURE ID PANEL (REFLEXED) - BCID2

## 2023-10-17 LAB — CBC
HCT: 22.6 % — ABNORMAL LOW (ref 39.0–52.0)
Hemoglobin: 7.1 g/dL — ABNORMAL LOW (ref 13.0–17.0)
MCH: 28.4 pg (ref 26.0–34.0)
MCHC: 31.4 g/dL (ref 30.0–36.0)
MCV: 90.4 fL (ref 80.0–100.0)
Platelets: 65 10*3/uL — ABNORMAL LOW (ref 150–400)
RBC: 2.5 MIL/uL — ABNORMAL LOW (ref 4.22–5.81)
RDW: 18.4 % — ABNORMAL HIGH (ref 11.5–15.5)
WBC: 2.1 10*3/uL — ABNORMAL LOW (ref 4.0–10.5)
nRBC: 0 % (ref 0.0–0.2)

## 2023-10-17 LAB — BASIC METABOLIC PANEL WITH GFR
Anion gap: 11 (ref 5–15)
BUN: 55 mg/dL — ABNORMAL HIGH (ref 8–23)
CO2: 20 mmol/L — ABNORMAL LOW (ref 22–32)
Calcium: 7.1 mg/dL — ABNORMAL LOW (ref 8.9–10.3)
Chloride: 104 mmol/L (ref 98–111)
Creatinine, Ser: 3.5 mg/dL — ABNORMAL HIGH (ref 0.61–1.24)
GFR, Estimated: 17 mL/min — ABNORMAL LOW (ref >60)
Glucose, Bld: 89 mg/dL (ref 70–99)
Potassium: 4.5 mmol/L (ref 3.5–5.1)
Sodium: 135 mmol/L (ref 135–145)

## 2023-10-17 LAB — POTASSIUM: Potassium: 4.7 mmol/L (ref 3.5–5.1)

## 2023-10-17 LAB — MRSA NEXT GEN BY PCR, NASAL: MRSA by PCR Next Gen: NOT DETECTED

## 2023-10-17 LAB — LACTIC ACID, PLASMA: Lactic Acid, Venous: 0.8 mmol/L (ref 0.5–1.9)

## 2023-10-17 LAB — MAGNESIUM: Magnesium: 1.6 mg/dL — ABNORMAL LOW (ref 1.7–2.4)

## 2023-10-17 MED ORDER — FUROSEMIDE 10 MG/ML IJ SOLN
40.0000 mg | Freq: Every day | INTRAMUSCULAR | Status: DC
  Administered 2023-10-17 – 2023-10-20 (×4): 40 mg via INTRAVENOUS
  Filled 2023-10-17 (×4): qty 4

## 2023-10-17 MED ORDER — PROCHLORPERAZINE EDISYLATE 10 MG/2ML IJ SOLN
10.0000 mg | Freq: Once | INTRAMUSCULAR | Status: AC
  Administered 2023-10-17: 10 mg via INTRAVENOUS
  Filled 2023-10-17: qty 2

## 2023-10-17 MED ORDER — METOPROLOL TARTRATE 25 MG PO TABS
25.0000 mg | ORAL_TABLET | Freq: Two times a day (BID) | ORAL | Status: DC
  Administered 2023-10-17 – 2023-10-18 (×3): 25 mg via ORAL
  Filled 2023-10-17 (×3): qty 1

## 2023-10-17 MED ORDER — MAGNESIUM SULFATE 2 GM/50ML IV SOLN
2.0000 g | Freq: Once | INTRAVENOUS | Status: AC
  Administered 2023-10-17: 2 g via INTRAVENOUS
  Filled 2023-10-17: qty 50

## 2023-10-17 NOTE — TOC Initial Note (Signed)
 Transition of Care [facility]) - Initial/Assessment Note    Patient Details  Name: [name] MRN: [id number] Date of Birth: [birth year]  Transition of Care [facility]) CM/SW Contact:    Christian Bennett, LCSW Phone Number: 10/17/2023, 3:21 PM  Clinical Narrative:  Readmission prevention screen complete. CSW met with patient. No family at bedside. Patient stated he did not have his hearing aides. He gave CSW permission to call family. CSW called wife, introduced role, and explained that discharge planning would be discussed. PCP is Christian Ferri, DO. Patient was previously driving himself to appointments but was told not to drive for 4 weeks so wife takes him. He uses [facility]. No issues obtaining medications. Patient lives home with wife and son. Son recently moved in with them to help. Patient was recently discharged from [facility]. Per notes, they had encouraged patient and wife to follow up with [facility] for outpatient PT and OT. CSW sent secure chat to their social worker to see if a referral was sent. Patient has a RW, claw-point cane, and BSC/shower chair but does not use it. No further concerns. CSW will continue to follow patient and his wife for support and facilitate return home once stable. Wife will transport him home at discharge.                Expected Discharge Plan: OP Rehab Barriers to Discharge: Continued Medical Work up   Patient Goals and CMS Choice            Expected Discharge Plan and Services       Living arrangements for the past 2 months: Single Family Home                                      Prior Living Arrangements/Services Living arrangements for the past 2 months: Single Family Home Lives with:: Spouse Patient language and need for interpreter reviewed:: Yes Do you feel safe going back to the place where you live?: Yes      Need for Family Participation in Patient Care: Yes (Comment) Care giver support system in place?: Yes  (comment) Current home services: DME Criminal Activity/Legal Involvement Pertinent to Current Situation/Hospitalization: No - Comment as needed  Activities of Daily Living   ADL Screening (condition at time of admission) Independently performs ADLs?: Yes (appropriate for developmental age) Is the patient deaf or have difficulty hearing?: No Does the patient have difficulty seeing, even when wearing glasses/contacts?: No Does the patient have difficulty concentrating, remembering, or making decisions?: No  Permission Sought/Granted Permission sought to share information with : Family Supports Permission granted to share information with : Yes, Verbal Permission Granted  Share Information with NAME: [name]     Permission granted to share info w Relationship: Wife  Permission granted to share info w Contact Information: [phone]  Emotional Assessment Appearance:: Appears stated age Attitude/Demeanor/Rapport: Engaged Affect (typically observed): Accepting, Calm Orientation: : Oriented to Self, Oriented to Place, Oriented to  Time, Oriented to Situation Alcohol / Substance Use: Not Applicable Psych Involvement: No (comment)  Admission diagnosis:  Acute respiratory failure with hypoxia (HCC) [J96.01] Severe sepsis with acute organ dysfunction (HCC) [A41.9, R65.20] Pneumonia of right lower lobe due to infectious organism [J18.9] Acute congestive heart failure, unspecified heart failure type [facility]) [I50.9] Patient Active Problem List   Diagnosis Date Noted   Severe sepsis with acute organ dysfunction (HCC) 10/16/2023  Cough 10/16/2023   Right lower lobe pneumonia 10/16/2023   Weakness 10/16/2023   Hypomagnesemia 10/16/2023   Paroxysmal atrial fibrillation (HCC) 10/10/2023   Mild cognitive impairment 10/09/2023   Persistent atrial fibrillation (HCC) 10/06/2023   Malnutrition of moderate degree 10/03/2023   Traumatic subarachnoid hemorrhage (HCC) 09/29/2023   Subarachnoid  hemorrhage following injury (HCC) 09/21/2023   CKD (chronic kidney disease) stage 4, GFR 15-29 ml/min (HCC) 06/29/2023   Pleural effusion 05/30/2023   Atrial flutter (HCC) 05/23/2023   History of CVA (cerebrovascular accident) 05/23/2023   Hypernatremia 05/23/2023   Pericardial effusion 05/23/2023   Pericarditis 05/23/2023   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   Overweight (BMI 25.0-29.9) 05/05/2023   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, 41-49%) (HCC) 05/05/2023   Acute kidney injury superimposed on chronic kidney disease (HCC) 05/04/2023   Atrial fibrillation with rapid ventricular response (HCC) 05/04/2023   Normocytic anemia 05/03/2023   ST elevation myocardial infarction (STEMI) (HCC) 05/03/2023   Tachycardia 05/03/2023   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/2023   Acute pericarditis 05/02/2023   Altered mental status 04/21/2023   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, of the knee, left 04/21/2023   Knee effusion, left 04/21/2023   Acute renal failure with acute 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) 04/17/2023   History of melanoma 04/17/2023   S/P reverse total shoulder arthroplasty, 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 04/17/2023   Hallucinations 04/17/2023   Hyperkalemia 04/17/2023   Acute pain of left knee 04/17/2023   Delirium 04/17/2023   Acute kidney injury (HCC) 04/17/2023   Rotator cuff arthropathy, right 02/01/2023   Rotator cuff tendinitis, right 02/01/2023   Traumatic complete tear of right rotator cuff 02/01/2023   Proteinuria, unspecified 10/19/2022   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   Cervical spondylosis 10/07/2021   Hypothyroidism 07/23/2021   Low vitamin B12 level 08/09/2020   Irritability 10/18/2019   Gastroesophageal reflux disease 03/13/2019   Acute on chronic anemia 07/04/2018   Leucopenia 02/05/2018   Supraspinatus tendon tear, right, initial encounter 02/16/2017    Infraspinatus tendon tear, right, initial encounter 02/16/2017   Biceps tendon rupture, right, initial encounter 02/16/2017   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   Hypotension 08/15/2016   Mixed hyperlipidemia 08/15/2016   Dizziness 12/30/2015   BPH (benign prostatic hyperplasia) 03/03/2015   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   Primary osteoarthritis of left knee 03/03/2015   Anticoagulation management encounter 03/03/2015   H/O neoplasm 01/12/2015   History of nonmelanoma skin cancer 01/12/2015   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   Symptomatic anemia 07/17/2014   Hepatosplenomegaly 07/17/2014   Neutropenia (HCC) 07/17/2014   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 [place] [address] [place] [address] Phone: [phone] Fax: [phone]  [facility], [place] - [address] [address] [place] [address] Phone: [phone] Fax: [phone]  [facility] [address] [place] [address] Phone: [phone] Fax: [phone]     Social Drivers of Health (SDOH) Social History: SDOH Screenings   Food Insecurity: No Food Insecurity (10/16/2023)  Housing: Low Risk  (  10/16/2023)  Transportation Needs: No Transportation Needs (10/16/2023)  Utilities: Not At Risk (10/16/2023)  Alcohol Screen: Low Risk  (07/12/2021)  Depression (PHQ2-9): Low Risk  (04/04/2023)  Financial Resource Strain: Low Risk  (07/12/2021)  Physical Activity: Inactive (07/12/2021)  Social Connections: Socially Integrated (10/16/2023)  Stress: No Stress Concern  Present (07/12/2021)  Tobacco Use: Medium Risk (10/16/2023)  Health Literacy: Medium Risk (06/09/2023)   Received from [facility]   SDOH Interventions:     Readmission Risk Interventions    10/17/2023    3:18 PM 04/21/2023   10:16 AM  Readmission Risk Prevention Plan  Transportation Screening Complete Complete  HRI or Home Care Consult  Complete  Social Work Consult for Recovery Care Planning/Counseling  Complete  Palliative Care Screening  Not Applicable  Medication Review [employer]) Complete Complete  PCP or Specialist appointment within 3-5 days of discharge Complete   SW Recovery Care/Counseling Consult Complete   Palliative Care Screening Not Applicable   Skilled Nursing Facility Not Applicable

## 2023-10-17 NOTE — Progress Notes (Addendum)
  PROGRESS NOTE    VIRAJ MCINERNY  ZOX:[id number] DOB: April 10, 1941 DOA: 10/16/2023 PCP: Terre Ferri P, DO  [id number]/[id number]-AA  LOS: 1 day   Brief hospital course:   Assessment & Plan: Mr. Tyon Libert is a 83 year old male with history of traumatic subarachnoid hemorrhage, myelofibrosis, symptomatic anemia, persistent atrial fibrillation, mild cognitive impairment, hypothyroid, GERD, BPH, hyperlipidemia, hypothyroid, who presents emergency department for chief concerns of cough and wheezing.   Per ED triage, 85% on RA, then 92% on 2 L O2.    * Severe sepsis with acute organ dysfunction [facility]) Patient has increased respiration rate, leukopenia, and source of infection is right lower lobe pneumonia and bacteremia  Right lower lobe pneumonia Pseudomonal bacteremia --started on Vanc cefepime  and azithromycin --cont just cefepime  since pseudomonas is the likely causative agent of pt's PNA.  Acute hypoxemic respiratory failure --2/2 PNA and CHF.  CXR with Findings suggesting congestive changes superimposed right lower lobe pneumonia and right pleural effusion. --tx both PNA and CHF  Acute dCHF exacerbation --BNP 1435, up from 168 about 4 months ago.  CXR with finding of CHF. --cont IV lasix  40 mg daily  Acute kidney injury (HCC) --Cr 3.7 on presentation, up from 3.03 on 10/10/23.   --monitor Cr while diuresing  Hyperkalemia In setting of acute kidney injury Status post furosemide  40 mg IV one-time dose per EDP and Albuterol  nebulizer 10 mg one-time dose  Hypomagnesemia --monitor and supplement PRN  Weakness --PT  Traumatic subarachnoid hemorrhage (HCC) In April 2025, patient was discharged to medicine rehabilitation and was discharged from that service on 10/10/2023  Hypothyroidism --cont Home levothyroxine  100 mcg daily   Chronic anemia --monitor Hgb and transfuse to keep Hgb >7  Myelofibrosis  --hold home revlimid , per pt's oncologist [name]   DVT prophylaxis:  SCD/Compression stockings Code Status: Full code  Family Communication:  Level of care: Progressive Dispo:   The patient is from: home Anticipated d/c is to: to be determined Anticipated d/c date is: > 3 days   Subjective and Interval History:  Pt reported feeling a little better.  Continued to have cough with sputum (which RN reported as clear).   Objective: Vitals:   10/17/23 0745 10/17/23 1128 10/17/23 1617 10/17/23 2013  BP: 115/76 112/73 115/76 114/86  Pulse: (!) 132 (!) 130 67 68  Resp:    19  Temp: 98.2 F (36.8 C) 98 F (36.7 C) 97.8 F (36.6 C) 97.8 F (36.6 C)  TempSrc:    Oral  SpO2: 100% 99% 100% 98%    Intake/Output Summary (Last 24 hours) at 10/17/2023 2038 Last data filed at 10/17/2023 2015 Gross per 24 hour  Intake 1034.68 ml  Output 1150 ml  Net -115.32 ml   There were no vitals filed for this visit.  Examination:   Constitutional: NAD, alert, oriented to person and place HEENT: conjunctivae and lids normal, EOMI, hard of hearing CV: No cyanosis.   RESP: normal respiratory effort, on 2L Neuro: II - XII grossly intact.   Psych: subdued mood and affect.     Data Reviewed: I have personally reviewed labs and imaging studies  Time spent: 50 minutes  Garrison Kanner, MD [facility] If 7PM-7AM, please contact night-coverage 10/17/2023, 8:38 PM

## 2023-10-17 NOTE — Progress Notes (Signed)
 PHARMACY - PHYSICIAN COMMUNICATION CRITICAL VALUE ALERT - BLOOD CULTURE IDENTIFICATION (BCID)  [name] is an 83 y.o. male who presented to [facility] on 10/16/2023 with a chief complaint of cough and wheezing. Noted   Assessment:  Severe sepsis and pneumonia (include suspected source if known)  Name of physician (or Provider) Contacted: Dr Gordy Lauber  Current antibiotics: Cefepime  2gm IV q 24hrs (renal dosing).  Changes to prescribed antibiotics recommended: No change recommended at this time. Will discuss with ID Pharm as well.   Results for orders placed or performed during the hospital encounter of 10/16/23  Blood Culture ID Panel (Reflexed) (Collected: 10/16/2023 10:32 AM)  Result Value Ref Range   Enterococcus faecalis NOT DETECTED NOT DETECTED   Enterococcus Faecium NOT DETECTED NOT DETECTED   Listeria monocytogenes NOT DETECTED NOT DETECTED   Staphylococcus species NOT DETECTED NOT DETECTED   Staphylococcus aureus (BCID) NOT DETECTED NOT DETECTED   Staphylococcus epidermidis NOT DETECTED NOT DETECTED   Staphylococcus lugdunensis NOT DETECTED NOT DETECTED   Streptococcus species NOT DETECTED NOT DETECTED   Streptococcus agalactiae NOT DETECTED NOT DETECTED   Streptococcus pneumoniae NOT DETECTED NOT DETECTED   Streptococcus pyogenes NOT DETECTED NOT DETECTED   A.calcoaceticus-baumannii NOT DETECTED NOT DETECTED   Bacteroides fragilis NOT DETECTED NOT DETECTED   Enterobacterales NOT DETECTED NOT DETECTED   Enterobacter cloacae complex NOT DETECTED NOT DETECTED   Escherichia coli NOT DETECTED NOT DETECTED   Klebsiella aerogenes NOT DETECTED NOT DETECTED   Klebsiella oxytoca NOT DETECTED NOT DETECTED   Klebsiella pneumoniae NOT DETECTED NOT DETECTED   Proteus species NOT DETECTED NOT DETECTED   Salmonella species NOT DETECTED NOT DETECTED   Serratia marcescens NOT DETECTED NOT DETECTED   Haemophilus influenzae NOT DETECTED NOT DETECTED   Neisseria meningitidis NOT DETECTED  NOT DETECTED   Pseudomonas aeruginosa DETECTED (A) NOT DETECTED   Stenotrophomonas maltophilia NOT DETECTED NOT DETECTED   Candida albicans NOT DETECTED NOT DETECTED   Candida auris NOT DETECTED NOT DETECTED   Candida glabrata NOT DETECTED NOT DETECTED   Candida krusei NOT DETECTED NOT DETECTED   Candida parapsilosis NOT DETECTED NOT DETECTED   Candida tropicalis NOT DETECTED NOT DETECTED   Cryptococcus neoformans/gattii NOT DETECTED NOT DETECTED   CTX-M ESBL NOT DETECTED NOT DETECTED   Carbapenem resistance IMP NOT DETECTED NOT DETECTED   Carbapenem resistance KPC NOT DETECTED NOT DETECTED   Carbapenem resistance NDM NOT DETECTED NOT DETECTED   Carbapenem resistance VIM NOT DETECTED NOT DETECTED    Siearra Amberg Rodriguez-Guzman PharmD, BCPS 10/17/2023 7:33 AM

## 2023-10-18 ENCOUNTER — Other Ambulatory Visit

## 2023-10-18 ENCOUNTER — Ambulatory Visit: Admitting: Oncology

## 2023-10-18 DIAGNOSIS — Z515 Encounter for palliative care: Secondary | ICD-10-CM

## 2023-10-18 DIAGNOSIS — R6520 Severe sepsis without septic shock: Secondary | ICD-10-CM | POA: Diagnosis not present

## 2023-10-18 DIAGNOSIS — D61818 Other pancytopenia: Secondary | ICD-10-CM

## 2023-10-18 DIAGNOSIS — D7581 Myelofibrosis: Secondary | ICD-10-CM

## 2023-10-18 DIAGNOSIS — A419 Sepsis, unspecified organism: Secondary | ICD-10-CM | POA: Diagnosis not present

## 2023-10-18 DIAGNOSIS — J90 Pleural effusion, not elsewhere classified: Secondary | ICD-10-CM

## 2023-10-18 LAB — CBC
HCT: 22.5 % — ABNORMAL LOW (ref 39.0–52.0)
Hemoglobin: 7.1 g/dL — ABNORMAL LOW (ref 13.0–17.0)
MCH: 28 pg (ref 26.0–34.0)
MCHC: 31.6 g/dL (ref 30.0–36.0)
MCV: 88.6 fL (ref 80.0–100.0)
Platelets: 70 10*3/uL — ABNORMAL LOW (ref 150–400)
RBC: 2.54 MIL/uL — ABNORMAL LOW (ref 4.22–5.81)
RDW: 18.3 % — ABNORMAL HIGH (ref 11.5–15.5)
WBC: 2 10*3/uL — ABNORMAL LOW (ref 4.0–10.5)
nRBC: 0 % (ref 0.0–0.2)

## 2023-10-18 LAB — BASIC METABOLIC PANEL WITH GFR
Anion gap: 8 (ref 5–15)
BUN: 57 mg/dL — ABNORMAL HIGH (ref 8–23)
CO2: 19 mmol/L — ABNORMAL LOW (ref 22–32)
Calcium: 6.9 mg/dL — ABNORMAL LOW (ref 8.9–10.3)
Chloride: 106 mmol/L (ref 98–111)
Creatinine, Ser: 3.45 mg/dL — ABNORMAL HIGH (ref 0.61–1.24)
GFR, Estimated: 17 mL/min — ABNORMAL LOW (ref >60)
Glucose, Bld: 96 mg/dL (ref 70–99)
Potassium: 4.5 mmol/L (ref 3.5–5.1)
Sodium: 133 mmol/L — ABNORMAL LOW (ref 135–145)

## 2023-10-18 LAB — MAGNESIUM: Magnesium: 2.1 mg/dL (ref 1.7–2.4)

## 2023-10-18 LAB — HEMOGLOBIN AND HEMATOCRIT, BLOOD
HCT: 28.6 % — ABNORMAL LOW (ref 39.0–52.0)
Hemoglobin: 9.3 g/dL — ABNORMAL LOW (ref 13.0–17.0)

## 2023-10-18 LAB — PREPARE RBC (CROSSMATCH)

## 2023-10-18 MED ORDER — TRAMADOL HCL 50 MG PO TABS: 50.0000 mg | ORAL_TABLET | Freq: Two times a day (BID) | ORAL | Status: DC | PRN

## 2023-10-18 MED ORDER — VITAMIN B-12 1000 MCG PO TABS
1000.0000 ug | ORAL_TABLET | Freq: Every day | ORAL | Status: DC
  Administered 2023-10-19 – 2023-10-24 (×6): 1000 ug via ORAL
  Filled 2023-10-18 (×6): qty 1

## 2023-10-18 MED ORDER — SODIUM CHLORIDE 0.9% IV SOLUTION: Freq: Once | INTRAVENOUS | Status: AC

## 2023-10-18 MED ORDER — DILTIAZEM HCL 25 MG/5ML IV SOLN
10.0000 mg | Freq: Once | INTRAVENOUS | Status: AC
  Administered 2023-10-18: 10 mg via INTRAVENOUS
  Filled 2023-10-18: qty 5

## 2023-10-18 MED ORDER — METOPROLOL TARTRATE 25 MG PO TABS
25.0000 mg | ORAL_TABLET | Freq: Once | ORAL | Status: AC
  Administered 2023-10-18: 25 mg via ORAL
  Filled 2023-10-18: qty 1

## 2023-10-18 MED ORDER — SIMVASTATIN 20 MG PO TABS
40.0000 mg | ORAL_TABLET | Freq: Every day | ORAL | Status: DC
  Administered 2023-10-18 – 2023-10-23 (×6): 40 mg via ORAL
  Filled 2023-10-18 (×6): qty 2

## 2023-10-18 MED ORDER — QUETIAPINE FUMARATE 25 MG PO TABS
25.0000 mg | ORAL_TABLET | Freq: Every evening | ORAL | Status: DC | PRN
  Administered 2023-10-19 – 2023-10-23 (×5): 25 mg via ORAL
  Filled 2023-10-18 (×5): qty 1

## 2023-10-18 MED ORDER — SODIUM CHLORIDE 0.9 % IV SOLN
1.0000 g | Freq: Two times a day (BID) | INTRAVENOUS | Status: DC
  Administered 2023-10-18 – 2023-10-24 (×14): 1 g via INTRAVENOUS
  Filled 2023-10-18 (×14): qty 10

## 2023-10-18 MED ORDER — FUROSEMIDE 10 MG/ML IJ SOLN
20.0000 mg | Freq: Once | INTRAMUSCULAR | Status: AC
  Administered 2023-10-18: 20 mg via INTRAVENOUS
  Filled 2023-10-18: qty 2

## 2023-10-18 MED ORDER — DULOXETINE HCL 20 MG PO CPEP
20.0000 mg | ORAL_CAPSULE | Freq: Every day | ORAL | Status: DC
  Administered 2023-10-18 – 2023-10-23 (×6): 20 mg via ORAL
  Filled 2023-10-18 (×7): qty 1

## 2023-10-18 MED ORDER — METOPROLOL TARTRATE 50 MG PO TABS
50.0000 mg | ORAL_TABLET | Freq: Two times a day (BID) | ORAL | Status: DC
  Administered 2023-10-18: 50 mg via ORAL
  Filled 2023-10-18: qty 1

## 2023-10-18 MED ORDER — AMIODARONE HCL 200 MG PO TABS
200.0000 mg | ORAL_TABLET | Freq: Every day | ORAL | Status: DC
  Administered 2023-10-18 – 2023-10-24 (×7): 200 mg via ORAL
  Filled 2023-10-18 (×7): qty 1

## 2023-10-18 MED ORDER — PANTOPRAZOLE SODIUM 40 MG PO TBEC
40.0000 mg | DELAYED_RELEASE_TABLET | Freq: Every day | ORAL | Status: DC
  Administered 2023-10-19 – 2023-10-24 (×6): 40 mg via ORAL
  Filled 2023-10-18 (×6): qty 1

## 2023-10-18 MED ORDER — METOPROLOL TARTRATE 25 MG PO TABS: 25.0000 mg | ORAL_TABLET | Freq: Two times a day (BID) | ORAL | Status: DC

## 2023-10-18 NOTE — Plan of Care (Signed)
  Problem: Education: Goal: Knowledge of General Education information will improve Description: Including pain rating scale, medication(s)/side effects and non-pharmacologic comfort measures Outcome: Progressing   Problem: Clinical Measurements: Goal: Ability to maintain clinical measurements within normal limits will improve Outcome: Progressing Goal: Diagnostic test results will improve Outcome: Progressing Goal: Cardiovascular complication will be avoided Outcome: Progressing   Problem: Activity: Goal: Risk for activity intolerance will decrease Outcome: Progressing   Problem: Elimination: Goal: Will not experience complications related to bowel motility Outcome: Progressing Goal: Will not experience complications related to urinary retention Outcome: Progressing

## 2023-10-18 NOTE — Consult Note (Signed)
 Palliative Medicine [facility] [place] Telephone:([phone] [phone]   Name: [name] Date: 10/18/2023 MRN: [id number]  DOB: 1941-01-31  Patient Care Team: [name], DO as PCP - General (Family Medicine) Jerelene Monday Deadra Everts, MD as PCP - Cardiology (Cardiology) Hillery Lown, MD as Referring Physician (Hematology) Rexene Catching, MD as Consulting Physician (Ophthalmology) Lisabeth Rider, MD as Consulting Physician (Neurology) Christina Coyer, MD as Consulting Physician (Urology) Devorah Fonder, MD as Consulting Physician (Cardiology) Geraline Knapp, MD (Urology) Avonne Boettcher, MD as Consulting Physician (Hematology and Oncology) Avonne Boettcher, MD as Consulting Physician (Oncology) Grafton Lawrence, RN (Inactive) as VBCI Care Management    REASON FOR CONSULTATION: [name] is a 83 y.o. male with multiple medical problems including myelofibrosis, symptomatic anemia, A-fib.  Patient has been hospitalized multiple times since November 2024, most recently 09/29/2023 to 10/10/2023 with subarachnoid hemorrhage after a fall.  Patient now readmitted 10/16/2023 with sepsis from pneumonia.  Palliative care consulted to address goals.  SOCIAL HISTORY:     reports that he quit smoking about 13 years ago. His smoking use included cigarettes. He has never used smokeless tobacco. He reports that he does not drink alcohol and does not use drugs.  Patient is married lives at home with his wife and son.  Patient has another son who lives nearby but is not involved.  Patient worked in [employer].  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   right forearm, ED[facility]   Stroke (HCC) 11/2007   R brain subcortical infarct    PAST SURGICAL HISTORY:  Past Surgical History:  Procedure Laterality Date   ASPIRATION / INJECTION RENAL CYST  07/08/2015   BACK SURGERY     approx 20- 25 years ago   COLONOSCOPY     EYE SURGERY     cataract both eyes   GAS INSERTION  08/11/2011   Procedure: INSERTION OF GAS;  Surgeon: Rexene Catching, MD;  Location: [facility] OR;  Service: Ophthalmology;  Laterality: Right;  [id number]   IR BONE MARROW BIOPSY & ASPIRATION  08/03/2023   IR THORACENTESIS ASP PLEURAL SPACE W/IMG GUIDE  09/22/2023   IR THORACENTESIS ASP PLEURAL SPACE W/IMG GUIDE  10/02/2023   LEFT HEART CATH AND CORONARY ANGIOGRAPHY N/A 05/02/2023   Procedure: LEFT HEART CATH AND CORONARY ANGIOGRAPHY;  Surgeon: Sammy Crisp, MD;  Location: [facility] INVASIVE CV LAB;  Service: Cardiovascular;  Laterality: N/A;   REVERSE SHOULDER ARTHROPLASTY Right 04/13/2023   Procedure: REVERSE SHOULDER ARTHROPLASTY;  Surgeon: Elner Hahn, MD;  Location: [facility] ORS;  Service: Orthopedics;  Laterality: Right;   SCLERAL BUCKLE  08/11/2011   Procedure: SCLERAL BUCKLE;  Surgeon: Rexene Catching, MD;  Location: [facility] OR;  Service: Ophthalmology;  Laterality: Right;   VARICOSE VEIN SURGERY      HEMATOLOGY/ONCOLOGY HISTORY:  Oncology History   No history exists.    ALLERGIES:  is allergic to meloxicam.  MEDICATIONS:  Current Facility-Administered Medications  Medication Dose Route Frequency Provider Last Rate Last Admin   acetaminophen  (TYLENOL ) tablet 650 mg  650 mg Oral Q6H PRN Cox, Amy N, DO       Or   acetaminophen  (TYLENOL ) suppository 650 mg  650 mg  Rectal Q6H PRN Cox, Amy N, DO       amiodarone  (PACERONE ) tablet 200 mg  200 mg Oral Daily Alexander, Natalie, DO   200 mg at 10/18/23 1206   ceFEPIme  (MAXIPIME ) 1 g in sodium chloride  0.9 % 100 mL IVPB  1 g Intravenous Q12H Alexander, Natalie, DO 200 mL/hr at 10/18/23 0955 1 g at 10/18/23 0955   [START ON 10/19/2023] cyanocobalamin  (VITAMIN B12) tablet 1,000 mcg  1,000 mcg Oral Daily Alexander, Natalie, DO       DULoxetine  (CYMBALTA ) DR capsule 20 mg  20 mg Oral QHS Alexander, Natalie, DO       furosemide  (LASIX ) injection 20 mg  20 mg Intravenous Once Alexander, Natalie, DO       furosemide  (LASIX ) injection 40 mg  40 mg Intravenous Daily Garrison Kanner, MD   40 mg at 10/18/23 5621   guaiFENesin  (ROBITUSSIN) 100 MG/5ML liquid 5 mL  5 mL Oral Q6H PRN Cox, Amy N, DO       ipratropium-albuterol  (DUONEB) 0.5-2.5 (3) MG/3ML nebulizer solution 3 mL  3 mL Nebulization Q6H PRN Cox, Amy N, DO       levothyroxine  (SYNTHROID ) tablet 100 mcg  100 mcg Oral Q0600 Cox, Amy N, DO   100 mcg at 10/18/23 3086   metoprolol  tartrate (LOPRESSOR ) tablet 50 mg  50 mg Oral BID Alexander, Natalie, DO       ondansetron  (ZOFRAN ) tablet 4 mg  4 mg Oral Q6H PRN Cox, Amy N, DO       Or   ondansetron  (ZOFRAN ) injection 4 mg  4 mg Intravenous Q6H PRN Cox, Amy N, DO   4 mg at 10/17/23 0124   [START ON 10/19/2023] pantoprazole  (PROTONIX ) EC tablet 40 mg  40 mg Oral Daily Alexander, Natalie, DO       QUEtiapine  (SEROQUEL ) tablet 25 mg  25 mg Oral QHS PRN Alexander, Natalie, DO       senna-docusate (Senokot-S) tablet 1 tablet  1 tablet Oral QHS PRN Cox, Amy N, DO       simvastatin  (ZOCOR ) tablet 40 mg  40 mg Oral QHS Alexander, Natalie, DO       traMADol  (ULTRAM ) tablet 50 mg  50 mg Oral Q12H PRN Alexander, Natalie, DO       Facility-Administered Medications Ordered in Other Encounters  Medication Dose Route Frequency Provider Last Rate Last Admin   epoetin  alfa-epbx (RETACRIT ) injection 10,000 Units  10,000 Units Subcutaneous Once  Rao, Archana C, MD       epoetin  alfa-epbx (RETACRIT ) injection 40,000 Units  40,000 Units Subcutaneous Once Avonne Boettcher, MD        VITAL SIGNS: BP (!) 132/98 (BP Location: Left Arm)   Pulse 97   Temp 98.1 F (36.7 C)   Resp 20   SpO2 98%  There were no vitals filed for this visit.  Estimated body mass index is 23.03 kg/m as calculated from the following:   Height as of 10/02/23: 5\' 11"  (1.803 m).   Weight  as of 10/10/23: 165 lb 2 oz (74.9 kg).  LABS: CBC:    Component Value Date/Time   WBC 2.0 (L) 10/18/2023 0202   HGB 7.1 (L) 10/18/2023 0202   HGB 9.4 (L) 09/13/2023 0941   HGB 6.9 (LL) 07/04/2023 1508   HCT 22.5 (L) 10/18/2023 0202   HCT 21.6 (L) 07/04/2023 1508   PLT 70 (L) 10/18/2023 0202   PLT 204 09/13/2023 0941   PLT 281 07/04/2023 1508   MCV 88.6 10/18/2023 0202   MCV 90 07/04/2023 1508   MCV 90 06/18/2014 1015   NEUTROABS 1.5 (L) 10/16/2023 1028   NEUTROABS 12.8 (H) 06/12/2023 1602   NEUTROABS 1.8 06/18/2014 1015   LYMPHSABS 0.3 (L) 10/16/2023 1028   LYMPHSABS 0.6 (L) 06/12/2023 1602   LYMPHSABS 0.6 (L) 06/18/2014 1015   MONOABS 0.1 10/16/2023 1028   MONOABS 0.3 06/18/2014 1015   EOSABS 0.0 10/16/2023 1028   EOSABS 0.0 06/12/2023 1602   EOSABS 0.0 06/18/2014 1015   BASOSABS 0.0 10/16/2023 1028   BASOSABS 0.0 06/12/2023 1602   BASOSABS 0.0 06/18/2014 1015   Comprehensive Metabolic Panel:    Component Value Date/Time   NA 133 (L) 10/18/2023 0202   NA 142 08/16/2023 1542   NA 141 06/18/2014 1015   K 4.5 10/18/2023 0202   K 4.5 06/18/2014 1015   CL 106 10/18/2023 0202   CL 107 06/18/2014 1015   CO2 19 (L) 10/18/2023 0202   CO2 26 06/18/2014 1015   BUN 57 (H) 10/18/2023 0202   BUN 53 (H) 08/16/2023 1542   BUN 20 (H) 06/18/2014 1015   CREATININE 3.45 (H) 10/18/2023 0202   CREATININE 3.72 (H) 09/13/2023 0941   CREATININE 1.09 06/18/2014 1015   GLUCOSE 96 10/18/2023 0202   GLUCOSE 160 (H) 06/18/2014 1015   CALCIUM  6.9 (L) 10/18/2023 0202   CALCIUM   8.5 06/18/2014 1015   AST 24 10/16/2023 1028   AST 23 09/13/2023 0941   ALT 17 10/16/2023 1028   ALT 25 09/13/2023 0941   ALT 43 06/18/2014 1015   ALKPHOS 65 10/16/2023 1028   ALKPHOS 118 (H) 06/18/2014 1015   BILITOT 1.7 (H) 10/16/2023 1028   BILITOT 0.6 09/13/2023 0941   PROT 5.1 (L) 10/16/2023 1028   PROT 5.4 (L) 09/13/2022 1403   PROT 6.6 06/18/2014 1015   ALBUMIN  2.3 (L) 10/16/2023 1028   ALBUMIN  3.9 09/13/2022 1403   ALBUMIN  3.9 06/18/2014 1015    RADIOGRAPHIC STUDIES: DG Chest Portable 1 View Result Date: 10/16/2023 CLINICAL DATA:  Shortness of breath EXAM: PORTABLE CHEST 1 VIEW COMPARISON:  Oct 06, 2023 FINDINGS: Worsening right lung infiltrates. Right lower lobe consolidation. Right small pleural effusion Prominence interstitial markings left lung. Findings could correlate with congestive changes superimposed right lower lobe pneumonia and right pleural effusion Total right shoulder arthroplasty IMPRESSION: Worsening right lung infiltrates. Right lower lobe consolidation. Right small pleural effusion. Findings could correlate with congestive changes superimposed right lower lobe pneumonia and right pleural effusion. Electronically Signed   By: Fredrich Jefferson M.D.   On: 10/16/2023 11:38   VAS US  LOWER EXTREMITY VENOUS (DVT) Result Date: 10/07/2023  Lower Venous DVT Study Patient Name:  [name]  Date of Exam:   10/06/2023 Medical Rec #: [id number]          Accession #:    [id number] Date of Birth: [birth year]          Patient Gender: M Patient Age:   6 years Exam Location:  [facility] Procedure:      VAS US  LOWER EXTREMITY VENOUS (DVT) Referring Phys: Cherri Corns --------------------------------------------------------------------------------  Indications: Edema. Other Indications: Rehab. Comparison Study: Previous exam on 05/03/2023 was negaitve for DVT Performing Technologist: Arlyce Berger RVT, RDMS  Examination Guidelines: A complete evaluation includes B-mode imaging,  spectral Doppler, color Doppler, and power Doppler as needed of all accessible portions of each vessel. Bilateral testing is considered an integral part of a complete examination. Limited examinations for reoccurring indications may be performed as noted. The reflux portion of the exam is performed with the patient in reverse Trendelenburg.  +---------+---------------+---------+-----------+----------+--------------+ RIGHT    CompressibilityPhasicitySpontaneityPropertiesThrombus Aging +---------+---------------+---------+-----------+----------+--------------+ CFV      Full           Yes      Yes                                 +---------+---------------+---------+-----------+----------+--------------+ SFJ      Full                                                        +---------+---------------+---------+-----------+----------+--------------+ FV Prox  Full           Yes      Yes                                 +---------+---------------+---------+-----------+----------+--------------+ FV Mid   Full           Yes      Yes                                 +---------+---------------+---------+-----------+----------+--------------+ FV DistalFull           Yes      Yes                                 +---------+---------------+---------+-----------+----------+--------------+ PFV      Full                                                        +---------+---------------+---------+-----------+----------+--------------+ POP      Full           Yes      Yes                                 +---------+---------------+---------+-----------+----------+--------------+ PTV      Full                                                        +---------+---------------+---------+-----------+----------+--------------+ PERO     Full                                                        +---------+---------------+---------+-----------+----------+--------------+    +---------+---------------+---------+-----------+----------+--------------+  LEFT     CompressibilityPhasicitySpontaneityPropertiesThrombus Aging +---------+---------------+---------+-----------+----------+--------------+ CFV      Full           Yes      Yes                                 +---------+---------------+---------+-----------+----------+--------------+ SFJ      Full                                                        +---------+---------------+---------+-----------+----------+--------------+ FV Prox  Full           Yes      Yes                                 +---------+---------------+---------+-----------+----------+--------------+ FV Mid   Full           Yes      Yes                                 +---------+---------------+---------+-----------+----------+--------------+ FV DistalFull           Yes      Yes                                 +---------+---------------+---------+-----------+----------+--------------+ PFV      Full                                                        +---------+---------------+---------+-----------+----------+--------------+ POP      Full           Yes      Yes                                 +---------+---------------+---------+-----------+----------+--------------+ PTV      Full                                                        +---------+---------------+---------+-----------+----------+--------------+ PERO     Full                                                        +---------+---------------+---------+-----------+----------+--------------+     Summary: BILATERAL: - No evidence of deep vein thrombosis seen in the lower extremities, bilaterally. -No evidence of popliteal cyst, bilaterally. -Subcutaneous edema, bilaterally.  *See table(s) above for measurements and observations. Electronically signed by Delaney Fearing on 10/07/2023 at 11:38:15 AM.    Final    DG Chest 2 View Result Date:  10/06/2023 CLINICAL DATA:  [id number] Pleural  effusion 142230 EXAM: CHEST - 2 VIEW COMPARISON:  October 03, 2023 FINDINGS: Small right pleural effusion with right basilar airspace disease, unchanged. Unchanged cardiomegaly. Tortuous aorta with aortic atherosclerosis. No acute fracture or destructive lesions. Multilevel thoracic osteophytosis. Partially visualized right shoulder arthroplasty. IMPRESSION: Unchanged small right pleural effusion with right basilar atelectasis. Electronically Signed   By: Rance Burrows M.D.   On: 10/06/2023 12:24   DG CHEST PORT 1 VIEW Result Date: 10/03/2023 CLINICAL DATA:  [id number] Pleural effusion 142230 EXAM: PORTABLE CHEST - 1 VIEW COMPARISON:  October 02, 2023 FINDINGS: Small right pleural effusion with right basilar atelectasis. Moderate cardiomegaly. Tortuous aorta with aortic atherosclerosis. No acute fracture or destructive lesions. Multilevel thoracic osteophytosis. Right glenohumeral joint arthroplasty. IMPRESSION: 1. Worsening, small right pleural effusion with right basilar atelectasis. 2. The nodular opacity in the right midlung on the prior radiograph is not well visualized. Electronically Signed   By: Rance Burrows M.D.   On: 10/03/2023 14:39   DG Chest 1 View Result Date: 10/02/2023 CLINICAL DATA:  Status post thoracentesis EXAM: CHEST  1 VIEW COMPARISON:  10/02/2023 FINDINGS: Normal mediastinum and cardiac silhouette. Normal pulmonary vasculature. No evidence of effusion, infiltrat no evidence of pneumothorax following RIGHT thoracentesis. RIGHT mid chest opacity again noted. There is volume loss in the RIGHT hemithorax similar to prior. IMPRESSION: No pneumothorax following thoracentesis. Electronically Signed   By: Deboraha Fallow M.D.   On: 10/02/2023 15:59   IR THORACENTESIS ASP PLEURAL SPACE W/IMG GUIDE Result Date: 10/02/2023 INDICATION: 83 year old male with recurrent right pleural effusion. Request for therapeutic thoracentesis. EXAM: ULTRASOUND GUIDED  THERAPEUTIC THORACENTESIS MEDICATIONS: 6 cc of 1% lidocaine  COMPLICATIONS: None immediate. PROCEDURE: An ultrasound guided thoracentesis was thoroughly discussed with the patient and questions answered. The benefits, risks, alternatives and complications were also discussed. The patient understands and wishes to proceed with the procedure. Written consent was obtained. Ultrasound was performed to localize and mark an adequate pocket of fluid in the right chest. The area was then prepped and draped in the normal sterile fashion. 1% Lidocaine  was used for local anesthesia. Under ultrasound guidance a 6 Fr Safe-T-Centesis catheter was introduced. Thoracentesis was performed. The catheter was removed and a dressing applied. FINDINGS: A total of approximately 825 cc of clear, dark amber pleural fluid was removed. IMPRESSION: Successful ultrasound guided right thoracentesis yielding 825 cc of pleural fluid. Procedure performed by Lambert Pillion, PA-C Electronically Signed   By: Nicoletta Barrier M.D.   On: 10/02/2023 15:39   DG Chest 2 View Result Date: 10/02/2023 CLINICAL DATA:  Cough. EXAM: CHEST - 2 VIEW COMPARISON:  Chest radiograph dated 09/22/2023. FINDINGS: Right pleural effusion with partial compressive atelectasis of the right lung, progressed since the prior radiograph. And LL airspace density in the right mid lung field may represent atelectasis or infiltrate. The left lung is clear. No pneumothorax. Stable cardiomegaly. Degenerative changes of the spine. No acute osseous pathology. IMPRESSION: 1. Right pleural effusion with partial compressive atelectasis of the right lung. 2. Right mid lung field atelectasis versus infiltrate. Electronically Signed   By: Angus Bark M.D.   On: 10/02/2023 13:26   DG Abd Portable 1V Result Date: 09/27/2023 CLINICAL DATA:  Nausea, vomiting. EXAM: PORTABLE ABDOMEN - 1 VIEW COMPARISON:  July 02, 2009. FINDINGS: Moderate gastric distention is noted. No significant dilatation  of large or small bowel is noted. Mild amount of stool seen throughout the colon. IMPRESSION: Mild stool burden. Moderate gastric distention is noted of uncertain etiology. Electronically Signed  By: Rosalene Colon M.D.   On: 09/27/2023 12:14   US  RENAL Result Date: 09/26/2023 CLINICAL DATA:  [id number] AKI (acute kidney injury) (HCC) [id number] EXAM: RENAL / URINARY TRACT ULTRASOUND COMPLETE COMPARISON:  September 21, 2023 FINDINGS: Right Kidney: Renal measurements: 11.6 x 5.9 x 6.3 cm = volume: 224 mL. Multiple cysts, the largest measuring 7.3 x 6.3 x 7.1 cm. Increased cortical echogenicity. No hydronephrosis or nephrolithiasis. Left Kidney: Renal measurements: 12.4 x 5.1 x 5.4 cm = volume: 149 mL. Increased echogenicity No hydronephrosis or nephrolithiasis. Multiple cysts, the largest measures 5.5 x 5.6 x 6.4 cm. Bladder: Appears normal for degree of bladder distention. Other: Lobular hypoechogenicity in the gallbladder measuring 2.9 x 1.4 x 2.9 cm. 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 IMPRESSION: No hydronephrosis or nephrolithiasis. 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 Electronically Signed   By: Rance Burrows M.D.   On: 09/26/2023 08:20   MR CERVICAL SPINE WO CONTRAST Result Date: 09/25/2023 CLINICAL DATA:  acute neck pain, fall. EXAM: MRI CERVICAL SPINE WITHOUT CONTRAST TECHNIQUE: Multiplanar, multisequence MR imaging of the cervical spine was performed. No intravenous contrast was administered. COMPARISON:  None Available. FINDINGS: Alignment: Approximately 4 mm of anterolisthesis of C5 on C6, similar to recent CT. No other sagittal subluxation. Vertebrae: Please see recent CT of the cervical spine for characterization of bony detail. There is heterogeneous marrow without visible marrow edema. No suspicious bone lesions. Cord: Normal cord signal. Posterior Fossa, vertebral arteries, paraspinal tissues: Small volume of prevertebral edema, greatest in the upper  cervical spine and extending inferiorly to the cervicothoracic junction. Uplifting of the anterior longitudinal ligament with suspected disruption at C2 (for example see series 2, image 10). Incomplete STIR suppression in areas limits assessment. Disc levels: Severe craniocervical degenerative change. C2-C3: Facet arthropathy. No significant canal or foraminal stenosis. C3-C4: Posterior disc osteophyte complex. Bilateral facet and uncovertebral hypertrophy. Resulting severe right and moderate to severe left foraminal stenosis. Mild canal stenosis. C4-C5: Left greater than right facet and uncovertebral hypertrophy. Resulting moderate to severe left foraminal stenosis and mild right foraminal stenosis. Patent canal. C5-C6: 4 mm of anterolisthesis of C4 on C5. Posterior disc osteophyte complex with bilateral facet and uncovertebral hypertrophy. Resulting moderate canal stenosis and severe bilateral foraminal stenosis. C6-C7: Left greater than right facet and uncovertebral hypertrophy. Resulting severe left and moderate right foraminal stenosis. Patent canal. C7-T1: Left greater than right facet and uncovertebral hypertrophy. Resulting mild-to-moderate bilateral foraminal stenosis. Patent canal. IMPRESSION: 1. 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 Adjacent small volume of prevertebral edema which extends inferiorly to the cervicothoracic junction. 2. At C5-C6, moderate canal stenosis and severe bilateral foraminal stenosis. 3. At C6-C7, severe left and moderate right foraminal stenosis. 4. At C3-C4, severe right and moderate to severe left foraminal stenosis. 5. At C4-C5, moderate to severe left foraminal stenosis. These results will be called to the ordering clinician or representative by the Radiologist Assistant, and communication documented in the PACS or [employer]. Electronically Signed   By: Stevenson Elbe M.D.   On: 09/25/2023 00:52   CT HEAD WO CONTRAST ( ) Result Date:  09/24/2023 CLINICAL DATA:  Follow-up subarachnoid hemorrhage. EXAM: CT HEAD WITHOUT CONTRAST TECHNIQUE: Contiguous axial images were obtained from the base of the skull through the vertex without intravenous contrast. RADIATION DOSE REDUCTION: This exam was performed according to the departmental dose-optimization program which includes automated exposure control, adjustment of the mA and/or kV according to patient size and/or  use of iterative reconstruction technique. COMPARISON:  CT head 09/21/2023 FINDINGS: Brain: Focal subarachnoid hemorrhage involving the sulci within the left parietal lobe are redemonstrated and appear slightly less conspicuous compared to 09/21/2023. No significant mass effect. No midline shift. Stable ventricular caliber. No new hemorrhage is noted. No evidence of acute infarct. Generalized cerebral atrophy. Vascular: No hyperdense vessel or unexpected calcification. Skull: No acute fracture. Decreased size of the left frontal scalp hematoma. Sinuses/Orbits: No acute abnormality. Other: None. IMPRESSION: Slightly decreased subarachnoid hemorrhage in the left parietal lobe compared to 09/21/2023. No new hemorrhage. Electronically Signed   By: Rozell Cornet M.D.   On: 09/24/2023 22:33   MR SHOULDER LEFT WO CONTRAST Result Date: 09/22/2023 CLINICAL DATA:  Fall. EXAM: MRI OF THE LEFT SHOULDER WITHOUT CONTRAST TECHNIQUE: Multiplanar, multisequence MR imaging of the shoulder was performed. No intravenous contrast was administered. COMPARISON:  None Available. FINDINGS: Rotator cuff: Supraspinatus and infraspinatus tendinosis with partial-thickness articular sided insertional tearing at the junctional fibers of the posterior supraspinatus and anterior infraspinatus tendons. Teres minor tendon is intact. Subscapularis tendon is intact. Muscles: Partial-thickness tear at the origin of the acromial head of the deltoid muscle with associated intramuscular edema. No intramuscular collection. No  significant muscle atrophy. Biceps Long Head: Diminutive appearance with chronic tearing of the intra-articular biceps tendon. Acromioclavicular Joint: Moderate arthropathy of the acromioclavicular joint. Widening of the acromioclavicular joint measuring 8-10 mm with associated joint effusion and disruption of the superior acromioclavicular ligament. No subacromial/subdeltoid bursal fluid. Glenohumeral Joint: Glenohumeral joint effusion. Diffuse thinning of the glenoid articular cartilage. Labrum: Diffuse labral degeneration. Tear of the superior labrum extending through the posteroinferior labrum. Bones: No acute fracture identified. Subcortical cystic change at the greater tuberosity. No aggressive osseous lesion. IMPRESSION: 1. Widening of the acromioclavicular joint measuring 8-10 mm with disruption of the superior acromioclavicular ligament and associated joint effusion. These findings are compatible with [place] type II injury. 2. Partial-thickness tear at the origin of the acromial head of the deltoid muscle with associated grade 2 strain. 3. Diffuse labral degeneration with tear of the superior labrum extending through the posteroinferior labrum. 4. Supraspinatus and infraspinatus tendinosis with partial-thickness articular sided insertional tearing at the junctional fibers of the posterior supraspinatus and anterior infraspinatus tendons. 5. Glenohumeral joint effusion. Electronically Signed   By: Mannie Seek M.D.   On: 09/22/2023 16:57   IR THORACENTESIS ASP PLEURAL SPACE W/IMG GUIDE Result Date: 09/22/2023 INDICATION: 83 year old male presented to ED after a fall, previous CT showed right pleural effusion. Request for therapeutic and diagnostic thoracentesis. EXAM: ULTRASOUND GUIDED RIGHT THORACENTESIS MEDICATIONS: 10 mL 1% lidocaine  COMPLICATIONS: None immediate. PROCEDURE: An ultrasound guided thoracentesis was thoroughly discussed with the patient and his wife and questions answered. Wife  states that patient had thoracentesis at [facility] before. The benefits, risks, alternatives and complications were also discussed. The patient and his wife understands and wishes to proceed with the procedure. Written consent was obtained. Ultrasound was performed to localize and mark an adequate pocket of fluid in the right chest. The area was then prepped and draped in the normal sterile fashion. 1% Lidocaine  was used for local anesthesia. Under ultrasound guidance a 6 Fr Safe-T-Centesis catheter was introduced. Thoracentesis was performed. The catheter was removed and a dressing applied. FINDINGS: A total of approximately 1650 mL of hazy amber fluid was removed. Samples were sent to the laboratory as requested by the clinical team. Post procedure chest X-ray reviewed, negative for pneumothorax. IMPRESSION: Successful ultrasound guided right thoracentesis yielding 1650 mL  of pleural fluid. Performed by: Aimee Han, PA-C Electronically Signed   By: Nicoletta Barrier M.D.   On: 09/22/2023 16:05   DG Chest 1 View Result Date: 09/22/2023 CLINICAL DATA:  [id number] Status post thoracentesis [id number]. EXAM: CHEST  1 VIEW COMPARISON:  09/21/2023. FINDINGS: There is small right pleural effusion with associated compressive atelectatic changes in the right lung. There is significant interval decrease in the amount of effusion, since the prior study, status post thoracentesis. No pneumothorax. Bilateral lung fields are otherwise clear. Left lateral costophrenic angle is clear. Stable cardio-mediastinal silhouette. No acute osseous abnormalities. Right reverse shoulder arthroplasty noted. The soft tissues are within normal limits. IMPRESSION: *Significant interval decrease in the amount of right pleural effusion, status post thoracentesis. No pneumothorax. Electronically Signed   By: Beula Brunswick M.D.   On: 09/22/2023 15:44   CT CHEST ABDOMEN PELVIS W CONTRAST Result Date: 09/21/2023 CLINICAL DATA:  Trauma EXAM: CT CHEST, ABDOMEN, AND  PELVIS WITH CONTRAST TECHNIQUE: Multidetector CT imaging of the chest, abdomen and pelvis was performed following the standard protocol during bolus administration of intravenous contrast. RADIATION DOSE REDUCTION: This exam was performed according to the departmental dose-optimization program which includes automated exposure control, adjustment of the mA and/or kV according to patient size and/or use of iterative reconstruction technique. CONTRAST:  75mL OMNIPAQUE  IOHEXOL  350 MG/ML SOLN COMPARISON:  CT abdomen and pelvis 05/02/2023. MRI abdomen 02/02/2023. FINDINGS: CT CHEST FINDINGS Cardiovascular: Ascending aortic aneurysm measures 4 cm. The heart is moderately enlarged. There is a trace pericardial effusion. There are atherosclerotic calcifications of the aorta. Mediastinum/Nodes: There is a peripherally calcified right thyroid  nodule measuring 1 cm. There are no enlarged mediastinal or hilar lymph nodes. The visualized esophagus is within normal limits. Lungs/Pleura: There is a large right pleural effusion which has increased from prior. There is compressive atelectasis of the right middle lobe and right lower lobe. There is minimal dependent atelectasis in the left lower lobe. The left lung is otherwise clear. Trachea and central airways are patent. Musculoskeletal: Right shoulder arthroplasty is present. No acute fractures are seen. CT ABDOMEN PELVIS FINDINGS Hepatobiliary: Heterogeneous enhancing lesion in the liver measures 2.9 x 2.4 cm and is not well seen on delayed imaging favored as hemangioma, confirmed by prior MRI. No subcapsular fluid collection. Gallbladder and bile ducts are within normal limits. Pancreas: Unremarkable. No pancreatic ductal dilatation or surrounding inflammatory changes. Spleen: The spleen is mild-to-moderately enlarged, unchanged. Adrenals/Urinary Tract: Bilateral renal cysts are unchanged. The largest simple cyst in the right kidney measures 7.4 cm. The largest simple cyst in  the left kidney measures 6.3 cm. Within the inferior pole the right kidney there is an exophytic cystic area with peripheral calcifications measuring 7.9 x 12.5 cm, similar to prior. There are numerable additional hypodensities which are too small to characterize in both kidneys, favored as cysts. There is no hydronephrosis or perinephric fluid collection. The adrenal glands and bladder are within normal limits. Stomach/Bowel: Stomach is within normal limits. Appendix appears normal. No evidence of bowel wall thickening, distention, or inflammatory changes. There is diffuse colonic diverticulosis. 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 Aorta and IVC are normal in size there are atherosclerotic calcifications of the aorta and coronary arteries. No enlarged lymph nodes are identified. Reproductive: Prostate is unremarkable. Other: No abdominal wall hernia or abnormality. No abdominopelvic ascites. Musculoskeletal: Multilevel degenerative changes affect the spine. No acute fractures are identified. IMPRESSION: 1. No acute posttraumatic sequelae in the chest, abdomen or pelvis. 2. Large right pleural  effusion has increased from prior with compressive atelectasis of the right middle lobe and right lower lobe. 3. Stable cardiomegaly. 4. 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 Recommend annual imaging followup by CTA or MRA. This recommendation follows 2010 ACCF/AHA/AATS/ACR/ASA/SCA/SCAI/SIR/STS/SVM Guidelines for the Diagnosis and Management of Patients with Thoracic Aortic Disease. Circulation. 2010; 121: Z610-R604. Aortic aneurysm NOS (ICD10-I71.9) 5. Stable splenomegaly and splenic varices. 6. 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 7. Stable bilateral renal cysts. Stable 1 cm peripherally calcified cyst in the inferior pole of the right kidney. 8. Colonic diverticulosis. Aortic Atherosclerosis (ICD10-I70.0). Electronically Signed   By: Tyron Gallon M.D.   On: 09/21/2023 17:26   DG Pelvis  Portable Result Date: 09/21/2023 CLINICAL DATA:  Status post fall on blood thinners with altered mental status EXAM: PORTABLE PELVIS 2 VIEWS COMPARISON:  CT abdomen and pelvis dated 05/02/2023 FINDINGS: There is no evidence of acute displaced pelvic fracture or diastasis. No pelvic bone lesions are seen. IMPRESSION: No acute displaced pelvic fracture or diastasis. Electronically Signed   By: Limin  Xu M.D.   On: 09/21/2023 16:38   DG Chest Port 1 View Result Date: 09/21/2023 CLINICAL DATA:  Trauma EXAM: PORTABLE CHEST 1 VIEW COMPARISON:  Chest x-ray 05/04/2023 FINDINGS: There is a moderate-sized right pleural effusion. The heart is enlarged there central pulmonary vascular congestion. The cardiac silhouette is enlarged, unchanged. Right shoulder arthroplasty is present. No acute fractures are seen. IMPRESSION: 1. Moderate-sized right pleural effusion. 2. Cardiomegaly with central pulmonary vascular congestion. Electronically Signed   By: Tyron Gallon M.D.   On: 09/21/2023 16:38   CT HEAD WO CONTRAST Addendum Date: 09/21/2023 ADDENDUM REPORT: 09/21/2023 16:05 ADDENDUM: These results were called by telephone at the time of interpretation on 09/21/2023 at 4:02 pm to provider TRAVIS YOUNG , who verbally acknowledged these results. Electronically Signed   By: Denny Flack M.D.   On: 09/21/2023 16:05   Result Date: 09/21/2023 CLINICAL DATA:  Blunt trauma, fall. EXAM: CT HEAD WITHOUT CONTRAST TECHNIQUE: Contiguous axial images were obtained from the base of the skull through the vertex without intravenous contrast. RADIATION DOSE REDUCTION: This exam was performed according to the departmental dose-optimization program which includes automated exposure control, adjustment of the mA and/or kV according to patient size and/or use of iterative reconstruction technique. COMPARISON:  CT head 04/17/2023 FINDINGS: Brain: Focal subarachnoid hemorrhage involving sulci within the left parietal lobe. No additional areas of  intracranial hemorrhage noted. No edema or mass effect. No midline shift. No CT evidence of acute infarct. Nonspecific hypoattenuation in the periventricular and subcortical white matter favored to reflect chronic microvascular ischemic changes. The basilar cisterns are patent. Ventricles: Prominence of the ventricles suggesting underlying parenchymal volume loss. Vascular: Atherosclerotic calcifications of the carotid siphons and intracranial vertebral arteries. No hyperdense vessel. Skull: No acute or aggressive finding. Orbits: Bilateral lens replacement. Scleral buckle on the right. Orbits otherwise unremarkable. Sinuses: Mild mucosal thickening in the right maxillary sinus. Other: Mastoid air cells are clear. Left frontal scalp hematoma. Additional soft tissue swelling in the left parietal scalp. Traumatic Brain Injury Risk Stratification Skull Fracture: No - Low/mBIG 1 Subdural Hematoma (SDH): No - Low Subarachnoid Hemorrhage [facility]): trace (up to 2 sulci) - Low/mBIG 1 Epidural Hematoma (EDH): No - Low/mBIG 1 Cerebral contusion, intra-axial, intraparenchymal Hemorrhage (IPH): No Intraventricular Hemorrhage (IVH): No - Low/mBIG 1 Midline Shift > 1mm or Edema/effacement of sulci/vents: No - Low/mBIG 1 ---------------------------------------------------- IMPRESSION: Focal subarachnoid hemorrhage involving a single sulcus of the left parietal lobe.  No edema, mass effect, or midline shift. Left frontoparietal scalp hematomas. Chronic microvascular ischemic changes and mild to moderate parenchymal volume loss. Electronically Signed: By: Denny Flack M.D. On: 09/21/2023 15:58   CT CERVICAL SPINE WO CONTRAST Result Date: 09/21/2023 CLINICAL DATA:  Poly trauma, blunt.  Status post fall. EXAM: CT CERVICAL SPINE WITHOUT CONTRAST TECHNIQUE: Multidetector CT imaging of the cervical spine was performed without intravenous contrast. Multiplanar CT image reconstructions were also generated. RADIATION DOSE REDUCTION: This  exam was performed according to the departmental dose-optimization program which includes automated exposure control, adjustment of the mA and/or kV according to patient size and/or use of iterative reconstruction technique. COMPARISON:  Cervical spine radiographs 09/18/2012. MRI cervical spine 01/14/2011. Head CT and neck CTA 04/12/2022. FINDINGS: Alignment: Straightening of the usual cervical lordosis with a mild convex right scoliosis. 4 mm of anterolisthesis at C5-6, increased from previous cervical MRI. Skull base and vertebrae: There is diffuse spondylosis with severe disc space narrowing and facet arthrosis throughout the cervical spine. There is interfacetal ankylosis bilaterally at C2-3 and on the left at C4-5. Possible interbody ankylosis at those levels as well. Asymmetric arthrosis involving the right lateral atlantoaxial joint. Suggested nondisplaced fracture of the posterior arch of C1 on the right (best seen on axial images 24 and 25 of series 4) is grossly unchanged from previous head CT (image 1/5). No other evidence of acute cervical spine fracture or traumatic subluxation. Soft tissues and spinal canal: No prevertebral fluid or swelling. No visible canal hematoma. Disc levels: As alluded to above, there is severe multilevel spondylosis with disc space narrowing, uncinate spurring and facet arthrosis. Findings have progressed from previous MRI and contribute to spinal stenosis and foraminal narrowing at multiple levels. Upper chest: Chest findings dictated separately. Other: Bilateral carotid atherosclerosis. IMPRESSION: 1. No evidence of acute cervical spine fracture or traumatic subluxation. 2. 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 3. No acute soft tissue abnormalities identified. Electronically Signed   By: Elmon Hagedorn M.D.   On: 09/21/2023 15:25    PERFORMANCE STATUS (ECOG) : 3 - Symptomatic, >50% confined to bed  Review of Systems Unless  otherwise noted, a complete review of systems is negative.  Physical Exam General: NAD Pulmonary: Unlabored Extremities: no edema, no joint deformities Skin: no rashes Neurological: Weakness but otherwise nonfocal  IMPRESSION: Patient with primary myelofibrosis requiring intermittent blood transfusions and most recently on treatment with Revlimid .  However, patient has had limited ability to follow-up in the cancer center given multiple hospitalizations over the past several months.  Patient most recently hospitalized with subarachnoid hemorrhage requiring inpatient rehab.  Patient discharged home briefly prior to being readmitted with sepsis and pneumonia.  I met with patient, wife, and son to discuss goals.  All confirm that patient has significantly declined since November 2024 when he was initially hospitalized after a surgery.  Patient has had multiple subsequent hospitalizations here and at outside hospitals.  Wife says that patient will slowly improve with rehab prior to having significant setback.  Wife states that she understands that patient may further decline.  However, she remains hopeful that he will improve and be able to return to his previous high level of functioning.  Prior to November, patient was still working in [employer].  Patient owns a beach house at [facility] and traveled frequently.  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  Patient has  advanced directives but these do not appear to be on file.  Patient's wife states that she will bring in a copy to be scanned into his chart.  We discussed CODE STATUS.  However, patient and wife are undecided regarding decision making.  They stated that they needed time to think prior to making any decisions.  PLAN: - Continue current scope of treatment - Patient/family are thinking about decision making/CODE STATUS - Wife to bring in copy of ACP  documents  Case and plan discussed with [name]   Time Total: 50 minutes  Visit consisted of counseling and education dealing with the complex and emotionally intense issues of symptom management and palliative care in the setting of serious and potentially life-threatening illness.Greater than 50%  of this time was spent counseling and coordinating care related to the above assessment and plan.  Signed by: Gerilyn Kobus, PhD, NP-C

## 2023-10-18 NOTE — Evaluation (Signed)
 Physical Therapy Evaluation Patient Details Name: [name] MRN: [id number] DOB: Oct 24, 1940 Today's Date: 10/18/2023  History of Present Illness  83 yo M hosptialized in April after falling at home. CT showed small acute subarachnoid hemorrhage, MRI of L shoulder noted Supraspinatus and infraspinatus tendinosis with partial-thickness tear. No here with sepsis, SOB, tachycardia. PMH Covid, HLD, HTN, hypothyroidism, myelofibrosis, skin CA, CVA, back surgery  Clinical Impression  Pt initially refusing PT earlier this date, but did indicate willingness to work with PT later.  On return he was open to PT and ultimately did show good effort.  He did have HR > 100 bpm at rest, this got up to the 150s during ambulation/toileting on 2-3L.  He did not have any overt LOBs, but displayed general unsteadiness and shuffling/walker reliant gait.  Pt very fatigued with modest effort, PT assisted with post-BM clean up as pt was unable.  Pt will benefit from continued PT to address functional limitations.        If plan is discharge home, recommend the following: Assist for transportation;Supervision due to cognitive status;Help with stairs or ramp for entrance;Assistance with cooking/housework;A little help with walking and/or transfers   Can travel by private vehicle   No    Equipment Recommendations  (TBD)  Recommendations for Other Services       Functional Status Assessment Patient has had a recent decline in their functional status and demonstrates the ability to make significant improvements in function in a reasonable and predictable amount of time.     Precautions / Restrictions Precautions Precautions: Fall Recall of Precautions/Restrictions: Intact Precaution/Restrictions Comments: LUE WBAT Restrictions Weight Bearing Restrictions Per Provider Order: Yes LUE Weight Bearing Per Provider Order: Weight bearing as tolerated      Mobility  Bed Mobility Overal bed mobility: Needs  Assistance Bed Mobility: Supine to Sit     Supine to sit: Min assist Sit to supine: Min assist   General bed mobility comments: Pt did relatively well getting to/from sitting/supine, light assist and plenty of cuing to stay on task    Transfers Overall transfer level: Needs assistance Equipment used: Rolling walker (2 wheels) Transfers: Sit to/from Stand Sit to Stand: Min assist           General transfer comment: heavy UE use and forward leanin, minA to get to standing, able to maintain standing statically in walker.  Good overall effort with low tolernace with limited effort.    Ambulation/Gait Ambulation/Gait assistance: Min assist, Contact guard assist Gait Distance (Feet): 30 Feet Assistive device: Rolling walker (2 wheels)         General Gait Details: Pt with shuffling, walker reliant gait.  No LOBs, but needing close guarding and cuing.  HR up to ~150 with the effort, O2 remained in the 90s on 2-3 L.  Stairs            Wheelchair Mobility     Tilt Bed    Modified Rankin (Stroke Patients Only)       Balance Overall balance assessment: History of Falls, Needs assistance Sitting-balance support: No upper extremity supported, Feet supported Sitting balance-Leahy Scale: Fair     Standing balance support: Bilateral upper extremity supported Standing balance-Leahy Scale: Fair Standing balance comment: reliant on RW/UEs                             Pertinent Vitals/Pain Pain Assessment Pain Assessment: No/denies pain    Home Living  Family/patient expects to be discharged to:: Private residence Living Arrangements: Spouse/significant other;Children Available Help at Discharge: Family;Available 24 hours/day Type of Home: House Home Access: Ramped entrance Entrance Stairs-Rails: Right;Left;Can reach both Entrance Stairs-Number of Steps: 3 Alternate Level Stairs-Number of Steps: 7 Home Layout: Multi-level Home Equipment: Other  (comment);Grab bars - tub/shower;Grab bars - toilet;Cane - quad      Prior Function Prior Level of Function : Independent/Modified Independent;Driving;History of Falls (last six months)             Mobility Comments: Mod I with SPC ADLs Comments: Indep with ADLs but limited with RUE due to pain prior to surgery     Extremity/Trunk Assessment   Upper Extremity Assessment Upper Extremity Assessment: Generalized weakness    Lower Extremity Assessment Lower Extremity Assessment: Generalized weakness       Communication   Communication Communication: Impaired Factors Affecting Communication: Hearing impaired    Cognition Arousal: Alert Behavior During Therapy: WFL for tasks assessed/performed   PT - Cognitive impairments: Safety/Judgement                         Following commands: Intact       Cueing       General Comments General comments (skin integrity, edema, etc.): Pt's resting HR in the 100-120 range, up to 150s with effort to/on/from toilet    Exercises     Assessment/Plan    PT Assessment Patient needs continued PT services  PT Problem List Decreased strength;Decreased mobility;Decreased safety awareness;Decreased coordination;Decreased activity tolerance;Decreased balance       PT Treatment Interventions DME instruction;Therapeutic exercise;Gait training;Balance training;Stair training;Functional mobility training;Therapeutic activities;Patient/family education    PT Goals (Current goals can be found in the Care Plan section)  Acute Rehab PT Goals Patient Stated Goal: pt hopes to go home PT Goal Formulation: With patient Time For Goal Achievement: 10/31/23 Potential to Achieve Goals: Fair    Frequency Min 1X/week     Co-evaluation               AM-PAC PT "6 Clicks" Mobility  Outcome Measure Help needed turning from your back to your side while in a flat bed without using bedrails?: A Little Help needed moving from lying on  your back to sitting on the side of a flat bed without using bedrails?: A Little Help needed moving to and from a bed to a chair (including a wheelchair)?: A Lot Help needed standing up from a chair using your arms (e.g., wheelchair or bedside chair)?: A Lot Help needed to walk in hospital room?: Total Help needed climbing 3-5 steps with a railing? : Total 6 Click Score: 12    End of Session Equipment Utilized During Treatment: Gait belt Activity Tolerance: Patient tolerated treatment well Patient left: with call bell/phone within reach;with bed alarm set Nurse Communication: Mobility status (HR increase with the effort) PT Visit Diagnosis: Unsteadiness on feet (R26.81);Muscle weakness (generalized) (M62.81);History of falling (Z91.81);Difficulty in walking, not elsewhere classified (R26.2)    Time: 1610-9604 PT Time Calculation (min) (ACUTE ONLY): 27 min   Charges:   PT Evaluation $PT Eval Low Complexity: 1 Low PT Treatments $Gait Training: 8-22 mins PT General Charges $$ ACUTE PT VISIT: 1 Visit         Darice Edelman, DPT 10/18/2023, 12:42 PM

## 2023-10-18 NOTE — Progress Notes (Signed)
 PROGRESS NOTE    [name]   XBJ:[id number] DOB: 1941-04-23  DOA: 10/16/2023 Date of Service: 10/18/23 which is hospital day 2  PCP: [name], [facility] course / significant events:   HPI: [name] is a 83 year old male with history of traumatic subarachnoid hemorrhage, myelofibrosis, symptomatic anemia, persistent atrial fibrillation, mild cognitive impairment, hypothyroid, GERD, BPH, hyperlipidemia, hypothyroid, who presents emergency department for chief concerns of cough and wheezing.  05/12: admitted to hospitalist for sepsis d/t RLL pneumonia, AKI, hyperkalemia, HFpEF exacerbation. Started abx, diuresis  05/13: diuresing, (+)Bcx Pseudomonas, continue abx, 05/14: Cr slowly improving. Briefly Afib RVR but this improved w/ diltiazem IV x1 and increased beta blocker, resumed home amiodarone       Consultants:  Infectious disease  Palliative care   Procedures/Surgeries: none      ASSESSMENT & PLAN:   Severe sepsis with acute organ dysfunction - improved  Patient has increased respiration rate, leukopenia, and source of infection is right lower lobe pneumonia and bacteremia Treat underlying cause(s) below    Acute hypoxemic respiratory failure 2/2 PNA and CHF.  CXR with Findings suggesting congestive changes superimposed right lower lobe pneumonia and right pleural effusion. O2 support as needed   Treat underlying cause(s) below  Right lower lobe pneumonia Pseudomonal bacteremia cefepime  since pseudomonas is the likely causative agent of pt's PNA. ID consulted   Afib RVR - resolved  RVR on tele today 10/18/23 Cardizem 10 mg iv x1 Increase metoprolol  Resume home amiodarone   Acute dCHF exacerbation BNP 1435, up from 168 about 4 months ago.  CXR with finding of CHF. 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   monitor Cr while diuresing    Hyperkalemia - resolved  In setting of acute kidney injury Monitor BMP   Hypomagnesemia monitor and supplement PRN   Weakness --PT   Traumatic subarachnoid hemorrhage  In April 2025, patient was discharged to medicine rehabilitation and was discharged from that service on 10/10/2023 No anticoagulation   Hypothyroidism levothyroxine  100 mcg daily    Chronic anemia monitor Hgb  transfuse to keep Hgb >7   Myelofibrosis  hold home revlimid , per pt's oncologist Dr. Randy Francis Palliative care following     N/a based on BMI: There is no height or weight on file to calculate BMI.. Significantly low or high BMI is associated with higher medical risk.  Underweight - under 18  overweight - 25 to 29 obese - 30 or more Class 1 obesity: BMI of 30.0 to 34 Class 2 obesity: BMI of 35.0 to 39 Class 3 obesity: BMI of 40.0 to 49 Super Morbid Obesity: BMI 50-59 Super-super Morbid Obesity: BMI 60+ Healthy nutrition and physical activity advised as adjunct to other disease management and risk reduction treatments    DVT prophylaxis: no anticoagulation d/r SAH IV fluids: no continuous IV fluids  Nutrition: cardiac Central lines / other devices: none  Code Status: FULL CODE - see palliative note today 10/18/23  ACP documentation reviewed: none on file in [facility]  TOC needs: TBD Medical barriers to dispo: ID recs, CHF treating. Expected medical readiness for discharge several more days.              Subjective / Brief ROS:  Patient reports concern about :fluid on my lungs like last time" but denies CP/SOB. Thinks heart might be beating fast this morning Pain controlled.  Denies new weakness.  Tolerating diet.  Reports no concerns w/ urination/defecation.   Family Communication: none at this time     Objective Findings:  Vitals:   10/18/23 1202 10/18/23 1414 10/18/23 1441 10/18/23 1615  BP:  114/76 115/69 (!) 132/98  Pulse:  84 93 97  Resp:  18 17 20   Temp:  97.7 F (36.5  C) 98.2 F (36.8 C) 98.1 F (36.7 C)  TempSrc:  Oral Oral   SpO2: 99% 98% 98% 98%    Intake/Output Summary (Last 24 hours) at 10/18/2023 1646 Last data filed at 10/18/2023 1616 Gross per 24 hour  Intake 150 ml  Output 2500 ml  Net -2350 ml   There were no vitals filed for this visit.  Examination:  Physical Exam Constitutional:      General: He is not in acute distress. Cardiovascular:     Rate and Rhythm: Tachycardia present. Rhythm irregular.  Pulmonary:     Effort: Pulmonary effort is normal. No tachypnea or respiratory distress.     Breath sounds: Examination of the right-lower field reveals decreased breath sounds. Examination of the left-lower field reveals decreased breath sounds. Decreased breath sounds, rhonchi and rales (very faint rales / atelectasis) present. No wheezing.  Musculoskeletal:     Right lower leg: No edema.     Left lower leg: No edema.  Neurological:     General: No focal deficit present.     Mental Status: He is alert and oriented to person, place, and time.  Psychiatric:        Mood and Affect: Mood normal.        Behavior: Behavior normal.          Scheduled Medications:   amiodarone   200 mg Oral Daily   [START ON 10/19/2023] cyanocobalamin   1,000 mcg Oral Daily   DULoxetine   20 mg Oral QHS   furosemide   20 mg Intravenous Once   furosemide   40 mg Intravenous Daily   levothyroxine   100 mcg Oral Q0600   metoprolol  tartrate  50 mg Oral BID   [START ON 10/19/2023] pantoprazole   40 mg Oral Daily   simvastatin   40 mg Oral QHS    Continuous Infusions:  ceFEPime  (MAXIPIME ) IV 1 g (10/18/23 0955)    PRN Medications:  acetaminophen  **OR** acetaminophen , guaiFENesin , ipratropium-albuterol , ondansetron  **OR** ondansetron  (ZOFRAN ) IV, QUEtiapine , senna-docusate, traMADol   Antimicrobials from admission:  Anti-infectives (From admission, onward)    Start     Dose/Rate Route Frequency Ordered Stop   10/18/23 1000  ceFEPIme  (MAXIPIME ) 1 g in  sodium chloride  0.9 % 100 mL IVPB        1 g 200 mL/hr over 30 Minutes Intravenous Every 12 hours 10/18/23 0831     10/17/23 1200  ceFEPIme  (MAXIPIME ) 2 g in sodium chloride  0.9 % 100 mL IVPB  Status:  Discontinued        2 g 200 mL/hr over 30 Minutes Intravenous Every 24 hours 10/16/23 1338 10/18/23 0831   10/17/23 1000  azithromycin (ZITHROMAX) 500 mg in sodium chloride  0.9 % 250 mL IVPB  Status:  Discontinued        500 mg 250 mL/hr over 60 Minutes Intravenous Every 24 hours 10/16/23 1307 10/17/23 0836   10/17/23 0800  vancomycin  variable dose per unstable renal function (pharmacist dosing)  Status:  Discontinued         Does not apply See admin instructions 10/16/23 1344 10/17/23 0836   10/16/23 1330  vancomycin  (VANCOREADY) IVPB 1500 mg/300 mL  1,500 mg 150 mL/hr over 120 Minutes Intravenous  Once 10/16/23 1321 10/16/23 1706   10/16/23 1300  vancomycin  (VANCOCIN ) IVPB 1000 mg/200 mL premix  Status:  Discontinued        1,000 mg 200 mL/hr over 60 Minutes Intravenous  Once 10/16/23 1252 10/16/23 1321   10/16/23 1300  ceFEPIme  (MAXIPIME ) 2 g in sodium chloride  0.9 % 100 mL IVPB        2 g 200 mL/hr over 30 Minutes Intravenous  Once 10/16/23 1252 10/16/23 1341   10/16/23 1300  azithromycin (ZITHROMAX) 500 mg in sodium chloride  0.9 % 250 mL IVPB        500 mg 250 mL/hr over 60 Minutes Intravenous  Once 10/16/23 1252 10/16/23 1518           Data Reviewed:  I have personally reviewed the following...  CBC: Recent Labs  Lab 10/16/23 1028 10/17/23 0550 10/18/23 0202  WBC 2.0* 2.1* 2.0*  NEUTROABS 1.5*  --   --   HGB 8.0* 7.1* 7.1*  HCT 25.8* 22.6* 22.5*  MCV 93.1 90.4 88.6  PLT 76* 65* 70*   Basic Metabolic Panel: Recent Labs  Lab 10/16/23 1028 10/16/23 1031 10/17/23 0007 10/17/23 0550 10/18/23 0202  NA 137  --   --  135 133*  K 5.5*  --  4.7 4.5 4.5  CL 105  --   --  104 106  CO2 23  --   --  20* 19*  GLUCOSE 90  --   --  89 96  BUN 58*  --   --  55* 57*   CREATININE 3.70*  --   --  3.50* 3.45*  CALCIUM  7.5*  --   --  7.1* 6.9*  MG  --  1.5*  --  1.6* 2.1   GFR: Estimated Creatinine Clearance: 17.2 mL/min (A) (by C-G formula based on SCr of 3.45 mg/dL (H)). Liver Function Tests: Recent Labs  Lab 10/16/23 1028  AST 24  ALT 17  ALKPHOS 65  BILITOT 1.7*  PROT 5.1*  ALBUMIN  2.3*   No results for input(s): "LIPASE", "AMYLASE" in the last 168 hours. No results for input(s): "AMMONIA" in the last 168 hours. Coagulation Profile: No results for input(s): "INR", "PROTIME" in the last 168 hours. Cardiac Enzymes: Recent Labs  Lab 10/16/23 1031  CKTOTAL 77   BNP (last 3 results) No results for input(s): "PROBNP" in the last 8760 hours. HbA1C: No results for input(s): "HGBA1C" in the last 72 hours. CBG: No results for input(s): "GLUCAP" in the last 168 hours. Lipid Profile: No results for input(s): "CHOL", "HDL", "LDLCALC", "TRIG", "CHOLHDL", "LDLDIRECT" in the last 72 hours. Thyroid  Function Tests: No results for input(s): "TSH", "T4TOTAL", "FREET4", "T3FREE", "THYROIDAB" in the last 72 hours. Anemia Panel: No results for input(s): "VITAMINB12", "FOLATE", "FERRITIN", "TIBC", "IRON", "RETICCTPCT" in the last 72 hours. Most Recent Urinalysis On File:     Component Value Date/Time   COLORURINE YELLOW 09/21/2023 1814   APPEARANCEUR CLEAR 09/21/2023 1814   APPEARANCEUR Clear 08/04/2023 1507   LABSPEC 1.019 09/21/2023 1814   PHURINE 5.0 09/21/2023 1814   GLUCOSEU NEGATIVE 09/21/2023 1814   HGBUR NEGATIVE 09/21/2023 1814   BILIRUBINUR NEGATIVE 09/21/2023 1814   BILIRUBINUR Negative 08/04/2023 1507   KETONESUR NEGATIVE 09/21/2023 1814   PROTEINUR 100 (A) 09/21/2023 1814   NITRITE NEGATIVE 09/21/2023 1814   LEUKOCYTESUR NEGATIVE 09/21/2023 1814   Sepsis Labs: @LABRCNTIP (procalcitonin:4,lacticidven:4) Microbiology: Recent Results (from the past 240 hours)  Culture, blood (routine x  2)     Status: Abnormal (Preliminary result)    Collection Time: 10/16/23 10:32 AM   Specimen: BLOOD  Result Value Ref Range Status   Specimen Description   Final    BLOOD RIGHT ANTECUBITAL Performed at [facility], [address] [place], [place] [address]    Special Requests   Final    BOTTLES DRAWN AEROBIC AND ANAEROBIC Blood Culture adequate volume Performed at [facility], [address] [place], [place] [address]    Culture  Setup Time   Final    GRAM NEGATIVE RODS AEROBIC BOTTLE ONLY CRITICAL RESULT CALLED TO, READ BACK BY AND VERIFIED WITHNoe Bath AT 6160 10/17/23 JG    Culture (A)  Final    PSEUDOMONAS AERUGINOSA SUSCEPTIBILITIES TO FOLLOW Performed at [facility], [address], [place], [place] [address]    Report Status PENDING  Incomplete  Blood Culture ID Panel (Reflexed)     Status: Abnormal   Collection Time: 10/16/23 10:32 AM  Result Value Ref Range Status   Enterococcus faecalis NOT DETECTED NOT DETECTED Final   Enterococcus Faecium NOT DETECTED NOT DETECTED Final   Listeria monocytogenes NOT DETECTED NOT DETECTED Final   Staphylococcus species NOT DETECTED NOT DETECTED Final   Staphylococcus aureus (BCID) NOT DETECTED NOT DETECTED Final   Staphylococcus epidermidis NOT DETECTED NOT DETECTED Final   Staphylococcus lugdunensis NOT DETECTED NOT DETECTED Final   Streptococcus species NOT DETECTED NOT DETECTED Final   Streptococcus agalactiae NOT DETECTED NOT DETECTED Final   Streptococcus pneumoniae NOT DETECTED NOT DETECTED Final   Streptococcus pyogenes NOT DETECTED NOT DETECTED Final   A.calcoaceticus-baumannii NOT DETECTED NOT DETECTED Final   Bacteroides fragilis NOT DETECTED NOT DETECTED Final   Enterobacterales NOT DETECTED NOT DETECTED Final   Enterobacter cloacae complex NOT DETECTED NOT DETECTED Final   Escherichia coli NOT DETECTED NOT DETECTED Final   Klebsiella aerogenes NOT DETECTED NOT DETECTED Final   Klebsiella oxytoca NOT DETECTED NOT DETECTED Final    Klebsiella pneumoniae NOT DETECTED NOT DETECTED Final   Proteus species NOT DETECTED NOT DETECTED Final   Salmonella species NOT DETECTED NOT DETECTED Final   Serratia marcescens NOT DETECTED NOT DETECTED Final   Haemophilus influenzae NOT DETECTED NOT DETECTED Final   Neisseria meningitidis NOT DETECTED NOT DETECTED Final   Pseudomonas aeruginosa DETECTED (A) NOT DETECTED Final    Comment: CRITICAL RESULT CALLED TO, READ BACK BY AND VERIFIED WITH:  NATHAN BELUE AT 6269 10/17/23 JG    Stenotrophomonas maltophilia NOT DETECTED NOT DETECTED Final   Candida albicans NOT DETECTED NOT DETECTED Final   Candida auris NOT DETECTED NOT DETECTED Final   Candida glabrata NOT DETECTED NOT DETECTED Final   Candida krusei NOT DETECTED NOT DETECTED Final   Candida parapsilosis NOT DETECTED NOT DETECTED Final   Candida tropicalis NOT DETECTED NOT DETECTED Final   Cryptococcus neoformans/gattii NOT DETECTED NOT DETECTED Final   CTX-M ESBL NOT DETECTED NOT DETECTED Final   Carbapenem resistance IMP NOT DETECTED NOT DETECTED Final   Carbapenem resistance KPC NOT DETECTED NOT DETECTED Final   Carbapenem resistance NDM NOT DETECTED NOT DETECTED Final   Carbapenem resistance VIM NOT DETECTED NOT DETECTED Final    Comment: Performed at [facility], [address] [place], [place] [address]  Resp panel by RT-PCR (RSV, Flu A&B, Covid) Anterior Nasal Swab     Status: None   Collection Time: 10/16/23  1:04 PM   Specimen: Anterior Nasal Swab  Result  Value Ref Range Status   SARS Coronavirus 2 by RT PCR NEGATIVE NEGATIVE Final    Comment: (NOTE) SARS-CoV-2 target nucleic acids are NOT DETECTED.  The SARS-CoV-2 RNA is generally detectable in upper respiratory specimens during the acute phase of infection. The lowest concentration of SARS-CoV-2 viral copies this assay can detect is 138 copies/mL. A negative result does not preclude SARS-Cov-2 infection and should not be used as the sole basis for  treatment or other patient management decisions. A negative result may occur with  improper specimen collection/handling, submission of specimen other than nasopharyngeal swab, presence of viral mutation(s) within the areas targeted by this assay, and inadequate number of viral copies(<138 copies/mL). A negative result must be combined with clinical observations, patient history, and epidemiological information. The expected result is Negative.  Fact Sheet for Patients:  BloggerCourse.com  Fact Sheet for Healthcare Providers:  SeriousBroker.it  This test is no t yet approved or cleared by the [place]  FDA and  has been authorized for detection and/or diagnosis of SARS-CoV-2 by FDA under an Emergency Use Authorization (EUA). This EUA will remain  in effect (meaning this test can be used) for the duration of the COVID-19 declaration under Section 564(b)(1) of the Act, 21 U.S.C.section 360bbb-3(b)(1), unless the authorization is terminated  or revoked sooner.       Influenza A by PCR NEGATIVE NEGATIVE Final   Influenza B by PCR NEGATIVE NEGATIVE Final    Comment: (NOTE) The Xpert Xpress SARS-CoV-2/FLU/RSV plus assay is intended as an aid in the diagnosis of influenza from Nasopharyngeal swab specimens and should not be used as a sole basis for treatment. Nasal washings and aspirates are unacceptable for Xpert Xpress SARS-CoV-2/FLU/RSV testing.  Fact Sheet for Patients: BloggerCourse.com  Fact Sheet for Healthcare Providers: SeriousBroker.it  This test is not yet approved or cleared by the [place]  FDA and has been authorized for detection and/or diagnosis of SARS-CoV-2 by FDA under an Emergency Use Authorization (EUA). This EUA will remain in effect (meaning this test can be used) for the duration of the COVID-19 declaration under Section 564(b)(1) of the Act, 21  U.S.C. section 360bbb-3(b)(1), unless the authorization is terminated or revoked.     Resp Syncytial Virus by PCR NEGATIVE NEGATIVE Final    Comment: (NOTE) Fact Sheet for Patients: BloggerCourse.com  Fact Sheet for Healthcare Providers: SeriousBroker.it  This test is not yet approved or cleared by the [place]  FDA and has been authorized for detection and/or diagnosis of SARS-CoV-2 by FDA under an Emergency Use Authorization (EUA). This EUA will remain in effect (meaning this test can be used) for the duration of the COVID-19 declaration under Section 564(b)(1) of the Act, 21 U.S.C. section 360bbb-3(b)(1), unless the authorization is terminated or revoked.  Performed at [facility], [address] [place], [place] [address]   Culture, blood (routine x 2)     Status: None (Preliminary result)   Collection Time: 10/17/23 12:07 AM   Specimen: BLOOD RIGHT ARM  Result Value Ref Range Status   Specimen Description BLOOD RIGHT ARM  Final   Special Requests   Final    BOTTLES DRAWN AEROBIC AND ANAEROBIC Blood Culture adequate volume   Culture   Final    NO GROWTH 1 DAY Performed at [facility], [address], [place], [place] [address]    Report Status PENDING  Incomplete  MRSA Next Gen by PCR, Nasal     Status: None   Collection Time: 10/17/23  9:08  PM   Specimen: Nasal Mucosa; Nasal Swab  Result Value Ref Range Status   MRSA by PCR Next Gen NOT DETECTED NOT DETECTED Final    Comment: (NOTE) The GeneXpert MRSA Assay (FDA approved for NASAL specimens only), is one component of a comprehensive MRSA colonization surveillance program. It is not intended to diagnose MRSA infection nor to guide or monitor treatment for MRSA infections. Test performance is not FDA approved in patients less than 60 years old. Performed at [facility], [address], [place], [place] [address]        Radiology Studies last 3 days: DG Chest Portable 1 View Result Date: 10/16/2023 CLINICAL DATA:  Shortness of breath EXAM: PORTABLE CHEST 1 VIEW COMPARISON:  Oct 06, 2023 FINDINGS: Worsening right lung infiltrates. Right lower lobe consolidation. Right small pleural effusion Prominence interstitial markings left lung. Findings could correlate with congestive changes superimposed right lower lobe pneumonia and right pleural effusion Total right shoulder arthroplasty IMPRESSION: Worsening right lung infiltrates. Right lower lobe consolidation. Right small pleural effusion. Findings could correlate with congestive changes superimposed right lower lobe pneumonia and right pleural effusion. Electronically Signed   By: Fredrich Jefferson M.D.   On: 10/16/2023 11:38          Earlin Sweeden, DO [facility] 10/18/2023, 4:46 PM    Dictation software may have been used to generate the above note. Typos may occur and escape review in typed/dictated notes. Please contact Dr Authur Leghorn directly for clarity if needed.  Staff may message me via secure chat in Epic  but this may not receive an immediate response,  please page me for urgent matters!  If 7PM-7AM, please contact night coverage [email or web address]

## 2023-10-18 NOTE — Progress Notes (Signed)
 Patient heart rate is sustained at 130's to 140's  BP 115/87, Dr. Alonza Arthurs NP made aware. Ordered to give scheduled dose early.  Latest Heart rate now 90's to low 100's

## 2023-10-18 NOTE — Progress Notes (Addendum)
 PHARMACY NOTE:  ANTIMICROBIAL RENAL DOSAGE ADJUSTMENT  Current antimicrobial regimen includes a mismatch between antimicrobial dosage and estimated renal function.  As per policy approved by the Pharmacy & Therapeutics and Medical Executive Committees, the antimicrobial dosage will be adjusted accordingly.  Current antimicrobial dosage:  Cefepime  2 g q24h  Indication: Pseudomonas bacteremia  Renal Function:  Estimated Creatinine Clearance: 17.2 mL/min (A) (by C-G formula based on SCr of 3.45 mg/dL (H)). []      On intermittent HD, scheduled: []      On CRRT    Antimicrobial dosage has been changed to: Cefepime  1 g q12h  Additional comments: Both doses appropriate per renal function, but q12h more likely to achieve Time>MIC goals for invasive Pseudomonas aeruginosa infection.  Thank you for allowing pharmacy to be a part of this patient's care.  Estela Held, PharmD PGY-2 Infectious Diseases Pharmacy Resident 10/18/2023 8:31 AM

## 2023-10-18 NOTE — Consult Note (Signed)
 NAME: [name]  DOB: 25-Oct-1940  MRN: [id number]  Date/Time: 10/18/2023 2:50 PM  REQUESTING PROVIDER: Dr.Alexander Subjective:  REASON FOR CONSULT: Pseudomonas bacteremia ? [name] is a 83 y.o. with a history of myelofibrosis, anemia, atrial fibrillation, hypothyroidism, hyperlipidemia, BPH, GERD,CKD, rt shoulder arthroplasty,Was recently in [facility] between 09/29/2023 until 10/10/2023 after a fall and sustaining traumatic subarachnoid hemorrhage, recurrent large right pleural effusion for which he underwent thoracentesis and culture was negative, left shoulder supraspinatus and infraspinatus tendinosis with partial-thickness tear presents to [facility] on 10/16/2023 brought in by EMS complaining of shortness of breath and wondering whether he had pneumonia.   In the ED vitals BP of 121/86, pulse 93, respiratory rate 20, Labs revealed a WBC of 2, Hb of 8, platelet of 76 and creatinine of 3.70 Chest x-ray revealed worsening right lung infiltrate with right lower lobe consolidation and right small pleural effusion.  Blood culture sent and patient was started on azithromycin, cefepime  and vancomycin . Blood cultures come back positive for Pseudomonas and I am seeing the patient for that Patient states he is feeling better since he came to the hospital  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   right forearm, [facility]   Stroke (HCC) 11/2007   R brain  subcortical infarct    Past Surgical History:  Procedure Laterality Date   ASPIRATION / INJECTION RENAL CYST  07/08/2015   BACK SURGERY     approx 20- 25 years ago   COLONOSCOPY     EYE SURGERY     cataract both eyes   GAS INSERTION  08/11/2011   Procedure: INSERTION OF GAS;  Surgeon: Rexene Catching, MD;  Location: [facility] OR;  Service: Ophthalmology;  Laterality: Right;  [id number]   IR BONE MARROW BIOPSY & ASPIRATION  08/03/2023   IR THORACENTESIS ASP PLEURAL SPACE W/IMG GUIDE  09/22/2023   IR THORACENTESIS ASP PLEURAL SPACE W/IMG GUIDE  10/02/2023   LEFT HEART CATH AND CORONARY ANGIOGRAPHY N/A 05/02/2023   Procedure: LEFT HEART CATH AND CORONARY ANGIOGRAPHY;  Surgeon: Sammy Crisp, MD;  Location: [facility] INVASIVE CV LAB;  Service: Cardiovascular;  Laterality: N/A;   REVERSE SHOULDER ARTHROPLASTY Right 04/13/2023   Procedure: REVERSE SHOULDER ARTHROPLASTY;  Surgeon: Elner Hahn, MD;  Location: [facility] ORS;  Service: Orthopedics;  Laterality: Right;   SCLERAL BUCKLE  08/11/2011   Procedure: SCLERAL BUCKLE;  Surgeon: Rexene Catching, MD;  Location: [facility] OR;  Service: Ophthalmology;  Laterality: Right;   VARICOSE VEIN SURGERY      Social History   Socioeconomic History   Marital status: Married    Spouse name: [name]    Number of children: 2   Years of education: 12+   Highest education level: Some college, no degree  Occupational History   Occupation: Fish farm manager: OTHER    Comment: community   Occupation: SELF  EMPLOYED    Employer: SELF EMPLOYED  Tobacco Use   Smoking status: Former    Current packs/day: 0.00    Types: Cigarettes    Quit date: 06/06/2010    Years since quitting: 13.3   Smokeless tobacco: Never  Vaping Use   Vaping status: Never Used  Substance and Sexual Activity   Alcohol use: No    Alcohol/week: 0.0 standard drinks of alcohol   Drug use: No   Sexual activity: Not Currently  Other Topics Concern   Not on file  Social History Narrative   Pt lives at home  with his family.   Caffeine Use- 2 cups daily   Patient has 2 children.    Patient has some college.    Patient is right handed.          Works full time   Social Drivers of Corporate investment banker Strain: Low Risk  (07/12/2021)   Overall Financial Resource Strain (CARDIA)    Difficulty of Paying Living Expenses: Not hard at all  Food Insecurity: No Food Insecurity (10/16/2023)   Hunger Vital Sign    Worried About Running Out of Food in the Last Year: Never true    Ran Out of Food in the Last Year: Never true  Transportation Needs: No Transportation Needs (10/16/2023)   PRAPARE - Administrator, Civil Service (Medical): No    Lack of Transportation (Non-Medical): No  Physical Activity: Inactive (07/12/2021)   Exercise Vital Sign    Days of Exercise per Week: 0 days    Minutes of Exercise per Session: 0 min  Stress: No Stress Concern Present (07/12/2021)   [employer] of Occupational Health - Occupational Stress Questionnaire    Feeling of Stress : Not at all  Social Connections: Socially Integrated (10/16/2023)   Social Connection and Isolation Panel [NHANES]    Frequency of Communication with Friends and Family: More than three times a week    Frequency of Social Gatherings with Friends and Family: More than three times a week    Attends Religious Services: More than 4 times per year    Active Member of [employer] or Organizations: Yes    Attends Engineer, structural: More than 4 times per year    Marital Status: Married  Catering manager Violence: Not At Risk (10/16/2023)   Humiliation, Afraid, Rape, and Kick questionnaire    Fear of Current or Ex-Partner: No    Emotionally Abused: No    Physically Abused: No    Sexually Abused: No    Family History  Problem Relation Age of Onset   Heart disease Father    Dementia Sister    Diabetes Son    Kidney disease Neg Hx    Prostate cancer Neg Hx    Allergies  Allergen Reactions   Meloxicam Nausea And  Vomiting   I? Current Facility-Administered Medications  Medication Dose Route Frequency Provider Last Rate Last Admin   acetaminophen  (TYLENOL ) tablet 650 mg  650 mg Oral Q6H PRN Cox, Amy N, DO       Or   acetaminophen  (TYLENOL ) suppository 650 mg  650 mg Rectal Q6H PRN Cox, Amy N, DO       amiodarone  (PACERONE ) tablet 200 mg  200 mg Oral Daily Alexander, Natalie, DO   200 mg at 10/18/23 1206   ceFEPIme  (MAXIPIME ) 1 g in sodium chloride  0.9 % 100 mL IVPB  1 g Intravenous Q12H Melodi Sprung, DO 200 mL/hr at 10/18/23  0955 1 g at 10/18/23 0955   [START ON 10/19/2023] cyanocobalamin  (VITAMIN B12) tablet 1,000 mcg  1,000 mcg Oral Daily Alexander, Natalie, DO       DULoxetine  (CYMBALTA ) DR capsule 20 mg  20 mg Oral QHS Alexander, Natalie, DO       furosemide  (LASIX ) injection 20 mg  20 mg Intravenous Once Alexander, Natalie, DO       furosemide  (LASIX ) injection 40 mg  40 mg Intravenous Daily Garrison Kanner, MD   40 mg at 10/18/23 1610   guaiFENesin  (ROBITUSSIN) 100 MG/5ML liquid 5 mL  5 mL Oral Q6H PRN Cox, Amy N, DO       ipratropium-albuterol  (DUONEB) 0.5-2.5 (3) MG/3ML nebulizer solution 3 mL  3 mL Nebulization Q6H PRN Cox, Amy N, DO       levothyroxine  (SYNTHROID ) tablet 100 mcg  100 mcg Oral Q0600 Cox, Amy N, DO   100 mcg at 10/18/23 9604   metoprolol  tartrate (LOPRESSOR ) tablet 50 mg  50 mg Oral BID Alexander, Natalie, DO       ondansetron  (ZOFRAN ) tablet 4 mg  4 mg Oral Q6H PRN Cox, Amy N, DO       Or   ondansetron  (ZOFRAN ) injection 4 mg  4 mg Intravenous Q6H PRN Cox, Amy N, DO   4 mg at 10/17/23 0124   [START ON 10/19/2023] pantoprazole  (PROTONIX ) EC tablet 40 mg  40 mg Oral Daily Alexander, Natalie, DO       QUEtiapine  (SEROQUEL ) tablet 25 mg  25 mg Oral QHS PRN Alexander, Natalie, DO       senna-docusate (Senokot-S) tablet 1 tablet  1 tablet Oral QHS PRN Cox, Amy N, DO       simvastatin  (ZOCOR ) tablet 40 mg  40 mg Oral QHS Alexander, Natalie, DO       traMADol  (ULTRAM ) tablet 50 mg  50  mg Oral Q12H PRN Alexander, Natalie, DO       Facility-Administered Medications Ordered in Other Encounters  Medication Dose Route Frequency Provider Last Rate Last Admin   epoetin  alfa-epbx (RETACRIT ) injection 10,000 Units  10,000 Units Subcutaneous Once Rao, Archana C, MD       epoetin  alfa-epbx (RETACRIT ) injection 40,000 Units  40,000 Units Subcutaneous Once Rao, Archana C, MD         Abtx:  Anti-infectives (From admission, onward)    Start     Dose/Rate Route Frequency Ordered Stop   10/18/23 1000  ceFEPIme  (MAXIPIME ) 1 g in sodium chloride  0.9 % 100 mL IVPB        1 g 200 mL/hr over 30 Minutes Intravenous Every 12 hours 10/18/23 0831     10/17/23 1200  ceFEPIme  (MAXIPIME ) 2 g in sodium chloride  0.9 % 100 mL IVPB  Status:  Discontinued        2 g 200 mL/hr over 30 Minutes Intravenous Every 24 hours 10/16/23 1338 10/18/23 0831   10/17/23 1000  azithromycin (ZITHROMAX) 500 mg in sodium chloride  0.9 % 250 mL IVPB  Status:  Discontinued        500 mg 250 mL/hr over 60 Minutes Intravenous Every 24 hours 10/16/23 1307 10/17/23 0836   10/17/23 0800  vancomycin  variable dose per unstable renal function (pharmacist dosing)  Status:  Discontinued         Does not apply See admin instructions 10/16/23 1344 10/17/23 0836   10/16/23 1330  vancomycin  (VANCOREADY) IVPB 1500 mg/300 mL        1,500 mg 150 mL/hr over  120 Minutes Intravenous  Once 10/16/23 1321 10/16/23 1706   10/16/23 1300  vancomycin  (VANCOCIN ) IVPB 1000 mg/200 mL premix  Status:  Discontinued        1,000 mg 200 mL/hr over 60 Minutes Intravenous  Once 10/16/23 1252 10/16/23 1321   10/16/23 1300  ceFEPIme  (MAXIPIME ) 2 g in sodium chloride  0.9 % 100 mL IVPB        2 g 200 mL/hr over 30 Minutes Intravenous  Once 10/16/23 1252 10/16/23 1341   10/16/23 1300  azithromycin (ZITHROMAX) 500 mg in sodium chloride  0.9 % 250 mL IVPB        500 mg 250 mL/hr over 60 Minutes Intravenous  Once 10/16/23 1252 10/16/23 1518       REVIEW OF  SYSTEMS:  Const: negative fever, negative chills, negative weight loss Eyes: negative diplopia or visual changes, negative eye pain ENT: negative coryza, negative sore throat, hard of hearing Resp: cough,, dyspnea Cards: negative for chest pain, palpitations, lower extremity edema GU: negative for frequency, dysuria and hematuria GI: Negative for abdominal pain, diarrhea, bleeding, constipation Skin: negative for rash and pruritus Heme: easy bruising  MS:  weakness Neurolo:negative for headaches, dizziness, vertigo, memory problems  Psych: negative for feelings of anxiety, depression  Endocrine: negative for thyroid , diabetes Allergy/Immunology- negative for any medication or food allergies  Objective:  VITALS:  BP 115/69 (BP Location: Left Arm)   Pulse 93   Temp 98.2 F (36.8 C) (Oral)   Resp 17   SpO2 98%   PHYSICAL EXAM:  General: Alert, cooperative, no distress, appears stated age.  Head: Normocephalic, without obvious abnormality, atraumatic. Eyes: Conjunctivae clear, anicteric sclerae. Pupils are equal ENT Nares normal. No drainage or sinus tenderness.  Hard of hearing Lips, mucosa, and tongue normal. No Thrush Neck: Supple, symmetrical, no adenopathy, thyroid : non tender no carotid bruit and no JVD. Back: No CVA tenderness. Lungs: Bilateral air entry Decreased in the right base irregular Heart: Irregular Abdomen: Soft, non-tender,not distended. Bowel sounds normal. No masses Extremities: atraumatic, no cyanosis. No edema. No clubbing Skin:  bruising Lymph: Cervical, supraclavicular normal. Neurologic: Grossly non-focal Pertinent Labs Lab Results CBC    Component Value Date/Time   WBC 2.0 (L) 10/18/2023 0202   RBC 2.54 (L) 10/18/2023 0202   HGB 7.1 (L) 10/18/2023 0202   HGB 9.4 (L) 09/13/2023 0941   HGB 6.9 (LL) 07/04/2023 1508   HCT 22.5 (L) 10/18/2023 0202   HCT 21.6 (L) 07/04/2023 1508   PLT 70 (L) 10/18/2023 0202   PLT 204 09/13/2023 0941   PLT 281  07/04/2023 1508   MCV 88.6 10/18/2023 0202   MCV 90 07/04/2023 1508   MCV 90 06/18/2014 1015   MCH 28.0 10/18/2023 0202   MCHC 31.6 10/18/2023 0202   RDW 18.3 (H) 10/18/2023 0202   RDW 16.9 (H) 07/04/2023 1508   RDW 18.8 (H) 06/18/2014 1015   LYMPHSABS 0.3 (L) 10/16/2023 1028   LYMPHSABS 0.6 (L) 06/12/2023 1602   LYMPHSABS 0.6 (L) 06/18/2014 1015   MONOABS 0.1 10/16/2023 1028   MONOABS 0.3 06/18/2014 1015   EOSABS 0.0 10/16/2023 1028   EOSABS 0.0 06/12/2023 1602   EOSABS 0.0 06/18/2014 1015   BASOSABS 0.0 10/16/2023 1028   BASOSABS 0.0 06/12/2023 1602   BASOSABS 0.0 06/18/2014 1015       Latest Ref Rng & Units 10/18/2023    2:02 AM 10/17/2023    5:50 AM 10/17/2023   12:07 AM  CMP  Glucose 70 - 99 mg/dL 96  89    BUN 8 - 23 mg/dL 57  55    Creatinine 1.66 - 1.24 mg/dL 0.63  0.16    Sodium 010 - 145 mmol/L 133  135    Potassium 3.5 - 5.1 mmol/L 4.5  4.5  4.7   Chloride 98 - 111 mmol/L 106  104    CO2 22 - 32 mmol/L 19  20    Calcium  8.9 - 10.3 mg/dL 6.9  7.1        Microbiology: Recent Results (from the past 240 hours)  Culture, blood (routine x 2)     Status: Abnormal (Preliminary result)   Collection Time: 10/16/23 10:32 AM   Specimen: BLOOD  Result Value Ref Range Status   Specimen Description   Final    BLOOD RIGHT ANTECUBITAL Performed at [facility], [address] [place], [place] [address]    Special Requests   Final    BOTTLES DRAWN AEROBIC AND ANAEROBIC Blood Culture adequate volume Performed at [facility], [address], [place], [place] [address]    Culture  Setup Time   Final    GRAM NEGATIVE RODS AEROBIC BOTTLE ONLY CRITICAL RESULT CALLED TO, READ BACK BY AND VERIFIED WITHNoe Bath AT 0254 10/17/23 JG    Culture (A)  Final    PSEUDOMONAS AERUGINOSA SUSCEPTIBILITIES TO FOLLOW Performed at [facility], [address] [place], [place] [address]    Report Status PENDING  Incomplete  Blood Culture ID Panel  (Reflexed)     Status: Abnormal   Collection Time: 10/16/23 10:32 AM  Result Value Ref Range Status   Enterococcus faecalis NOT DETECTED NOT DETECTED Final   Enterococcus Faecium NOT DETECTED NOT DETECTED Final   Listeria monocytogenes NOT DETECTED NOT DETECTED Final   Staphylococcus species NOT DETECTED NOT DETECTED Final   Staphylococcus aureus (BCID) NOT DETECTED NOT DETECTED Final   Staphylococcus epidermidis NOT DETECTED NOT DETECTED Final   Staphylococcus lugdunensis NOT DETECTED NOT DETECTED Final   Streptococcus species NOT DETECTED NOT DETECTED Final   Streptococcus agalactiae NOT DETECTED NOT DETECTED Final   Streptococcus pneumoniae NOT DETECTED NOT DETECTED Final   Streptococcus pyogenes NOT DETECTED NOT DETECTED Final   A.calcoaceticus-baumannii NOT DETECTED NOT DETECTED Final   Bacteroides fragilis NOT DETECTED NOT DETECTED Final   Enterobacterales NOT DETECTED NOT DETECTED Final   Enterobacter cloacae complex NOT DETECTED NOT DETECTED Final   Escherichia coli NOT DETECTED NOT DETECTED Final   Klebsiella aerogenes NOT DETECTED NOT DETECTED Final   Klebsiella oxytoca NOT DETECTED NOT DETECTED Final   Klebsiella pneumoniae NOT DETECTED NOT DETECTED Final   Proteus species NOT DETECTED NOT DETECTED Final   Salmonella species NOT DETECTED NOT DETECTED Final   Serratia marcescens NOT DETECTED NOT DETECTED Final   Haemophilus influenzae NOT DETECTED NOT DETECTED Final   Neisseria meningitidis NOT DETECTED NOT DETECTED Final   Pseudomonas aeruginosa DETECTED (A) NOT DETECTED Final    Comment: CRITICAL RESULT CALLED TO, READ BACK BY AND VERIFIED WITH:  NATHAN BELUE AT 3762 10/17/23 JG    Stenotrophomonas maltophilia NOT DETECTED NOT DETECTED Final   Candida albicans NOT DETECTED NOT DETECTED Final   Candida auris NOT DETECTED NOT DETECTED Final   Candida glabrata NOT DETECTED NOT DETECTED Final   Candida krusei NOT DETECTED NOT DETECTED Final   Candida parapsilosis NOT  DETECTED NOT DETECTED Final   Candida tropicalis NOT DETECTED NOT DETECTED Final   Cryptococcus neoformans/gattii NOT DETECTED NOT DETECTED Final  CTX-M ESBL NOT DETECTED NOT DETECTED Final   Carbapenem resistance IMP NOT DETECTED NOT DETECTED Final   Carbapenem resistance KPC NOT DETECTED NOT DETECTED Final   Carbapenem resistance NDM NOT DETECTED NOT DETECTED Final   Carbapenem resistance VIM NOT DETECTED NOT DETECTED Final    Comment: Performed at [facility], [address] [place], [place] [address]  Resp panel by RT-PCR (RSV, Flu A&B, Covid) Anterior Nasal Swab     Status: None   Collection Time: 10/16/23  1:04 PM   Specimen: Anterior Nasal Swab  Result Value Ref Range Status   SARS Coronavirus 2 by RT PCR NEGATIVE NEGATIVE Final    Comment: (NOTE) SARS-CoV-2 target nucleic acids are NOT DETECTED.  The SARS-CoV-2 RNA is generally detectable in upper respiratory specimens during the acute phase of infection. The lowest concentration of SARS-CoV-2 viral copies this assay can detect is 138 copies/mL. A negative result does not preclude SARS-Cov-2 infection and should not be used as the sole basis for treatment or other patient management decisions. A negative result may occur with  improper specimen collection/handling, submission of specimen other than nasopharyngeal swab, presence of viral mutation(s) within the areas targeted by this assay, and inadequate number of viral copies(<138 copies/mL). A negative result must be combined with clinical observations, patient history, and epidemiological information. The expected result is Negative.  Fact Sheet for Patients:  BloggerCourse.com  Fact Sheet for Healthcare Providers:  SeriousBroker.it  This test is no t yet approved or cleared by the [place]  FDA and  has been authorized for detection and/or diagnosis of SARS-CoV-2 by FDA under an Emergency Use  Authorization (EUA). This EUA will remain  in effect (meaning this test can be used) for the duration of the COVID-19 declaration under Section 564(b)(1) of the Act, 21 U.S.C.section 360bbb-3(b)(1), unless the authorization is terminated  or revoked sooner.       Influenza A by PCR NEGATIVE NEGATIVE Final   Influenza B by PCR NEGATIVE NEGATIVE Final    Comment: (NOTE) The Xpert Xpress SARS-CoV-2/FLU/RSV plus assay is intended as an aid in the diagnosis of influenza from Nasopharyngeal swab specimens and should not be used as a sole basis for treatment. Nasal washings and aspirates are unacceptable for Xpert Xpress SARS-CoV-2/FLU/RSV testing.  Fact Sheet for Patients: BloggerCourse.com  Fact Sheet for Healthcare Providers: SeriousBroker.it  This test is not yet approved or cleared by the [place]  FDA and has been authorized for detection and/or diagnosis of SARS-CoV-2 by FDA under an Emergency Use Authorization (EUA). This EUA will remain in effect (meaning this test can be used) for the duration of the COVID-19 declaration under Section 564(b)(1) of the Act, 21 U.S.C. section 360bbb-3(b)(1), unless the authorization is terminated or revoked.     Resp Syncytial Virus by PCR NEGATIVE NEGATIVE Final    Comment: (NOTE) Fact Sheet for Patients: BloggerCourse.com  Fact Sheet for Healthcare Providers: SeriousBroker.it  This test is not yet approved or cleared by the [place]  FDA and has been authorized for detection and/or diagnosis of SARS-CoV-2 by FDA under an Emergency Use Authorization (EUA). This EUA will remain in effect (meaning this test can be used) for the duration of the COVID-19 declaration under Section 564(b)(1) of the Act, 21 U.S.C. section 360bbb-3(b)(1), unless the authorization is terminated or revoked.  Performed at [facility], [address] [place], [place] [address]   Culture, blood (routine x 2)     Status: None (Preliminary result)   Collection  Time: 10/17/23 12:07 AM   Specimen: BLOOD RIGHT ARM  Result Value Ref Range Status   Specimen Description BLOOD RIGHT ARM  Final   Special Requests   Final    BOTTLES DRAWN AEROBIC AND ANAEROBIC Blood Culture adequate volume   Culture   Final    NO GROWTH 1 DAY Performed at [facility], [address] [place], [place] [address]    Report Status PENDING  Incomplete  MRSA Next Gen by PCR, Nasal     Status: None   Collection Time: 10/17/23  9:08 PM   Specimen: Nasal Mucosa; Nasal Swab  Result Value Ref Range Status   MRSA by PCR Next Gen NOT DETECTED NOT DETECTED Final    Comment: (NOTE) The GeneXpert MRSA Assay (FDA approved for NASAL specimens only), is one component of a comprehensive MRSA colonization surveillance program. It is not intended to diagnose MRSA infection nor to guide or monitor treatment for MRSA infections. Test performance is not FDA approved in patients less than 82 years old. Performed at [facility], [address] [place], [place] [address]     IMAGING RESULTS: Chest x-ray Right-sided infiltrate likely pneumonia Right loculated pleural effusion  I have personally reviewed the films ? Impression/Recommendation Myelofibrosis with pancytopenia   Pseudomonas bacteremia this is nosocomial following a recent hospitalization Is likely related to the pneumonia Repeat blood cultures after 48 days hours of antibiotic  Right pleural effusion status post thoracentesis Has consolidation in the l right lung Likely Pseudomonas pneumonia Continue cefepime   A-fib on amiodarone , metoprolol   CKD  Right shoulder reverse arthroplasty  Discussed the management with the patient in detail Discussed with the referring provider  Note:  This document was prepared using Dragon voice recognition software and may include  unintentional dictation errors.

## 2023-10-19 ENCOUNTER — Ambulatory Visit

## 2023-10-19 DIAGNOSIS — J151 Pneumonia due to Pseudomonas: Secondary | ICD-10-CM

## 2023-10-19 DIAGNOSIS — B965 Pseudomonas (aeruginosa) (mallei) (pseudomallei) as the cause of diseases classified elsewhere: Secondary | ICD-10-CM | POA: Diagnosis not present

## 2023-10-19 DIAGNOSIS — R7881 Bacteremia: Secondary | ICD-10-CM | POA: Diagnosis not present

## 2023-10-19 DIAGNOSIS — D61818 Other pancytopenia: Secondary | ICD-10-CM

## 2023-10-19 LAB — BASIC METABOLIC PANEL WITH GFR
Anion gap: 9 (ref 5–15)
BUN: 55 mg/dL — ABNORMAL HIGH (ref 8–23)
CO2: 22 mmol/L (ref 22–32)
Calcium: 7.3 mg/dL — ABNORMAL LOW (ref 8.9–10.3)
Chloride: 104 mmol/L (ref 98–111)
Creatinine, Ser: 3.43 mg/dL — ABNORMAL HIGH (ref 0.61–1.24)
GFR, Estimated: 17 mL/min — ABNORMAL LOW (ref >60)
Glucose, Bld: 111 mg/dL — ABNORMAL HIGH (ref 70–99)
Potassium: 4.1 mmol/L (ref 3.5–5.1)
Sodium: 135 mmol/L (ref 135–145)

## 2023-10-19 LAB — CBC
HCT: 26 % — ABNORMAL LOW (ref 39.0–52.0)
Hemoglobin: 8.2 g/dL — ABNORMAL LOW (ref 13.0–17.0)
MCH: 28.4 pg (ref 26.0–34.0)
MCHC: 31.5 g/dL (ref 30.0–36.0)
MCV: 90 fL (ref 80.0–100.0)
Platelets: 79 10*3/uL — ABNORMAL LOW (ref 150–400)
RBC: 2.89 MIL/uL — ABNORMAL LOW (ref 4.22–5.81)
RDW: 17.9 % — ABNORMAL HIGH (ref 11.5–15.5)
WBC: 3.3 10*3/uL — ABNORMAL LOW (ref 4.0–10.5)
nRBC: 0 % (ref 0.0–0.2)

## 2023-10-19 LAB — EXPECTORATED SPUTUM ASSESSMENT W GRAM STAIN, RFLX TO RESP C

## 2023-10-19 LAB — TYPE AND SCREEN
ABO/RH(D): O POS
Antibody Screen: NEGATIVE
Unit division: 0

## 2023-10-19 LAB — CULTURE, BLOOD (ROUTINE X 2): Special Requests: ADEQUATE

## 2023-10-19 LAB — BPAM RBC
Blood Product Expiration Date: 202506172359
ISSUE DATE / TIME: 202505141419
Unit Type and Rh: 5100

## 2023-10-19 LAB — MAGNESIUM: Magnesium: 2 mg/dL (ref 1.7–2.4)

## 2023-10-19 MED ORDER — DIPHENOXYLATE-ATROPINE 2.5-0.025 MG PO TABS
1.0000 | ORAL_TABLET | Freq: Four times a day (QID) | ORAL | Status: DC | PRN
  Administered 2023-10-19 – 2023-10-20 (×2): 1 via ORAL
  Filled 2023-10-19 (×2): qty 1

## 2023-10-19 MED ORDER — IPRATROPIUM-ALBUTEROL 0.5-2.5 (3) MG/3ML IN SOLN: 3.0000 mL | Freq: Four times a day (QID) | RESPIRATORY_TRACT | Status: AC | PRN

## 2023-10-19 MED ORDER — METOPROLOL TARTRATE 25 MG PO TABS
25.0000 mg | ORAL_TABLET | Freq: Two times a day (BID) | ORAL | Status: DC
  Administered 2023-10-19 – 2023-10-24 (×11): 25 mg via ORAL
  Filled 2023-10-19 (×11): qty 1

## 2023-10-19 MED ORDER — GUAIFENESIN-DM 100-10 MG/5ML PO SYRP
5.0000 mL | ORAL_SOLUTION | ORAL | Status: DC | PRN
  Administered 2023-10-19 – 2023-10-21 (×5): 5 mL via ORAL
  Filled 2023-10-19 (×5): qty 10

## 2023-10-19 NOTE — Care Management Important Message (Signed)
 Important Message  Patient Details  Name: [name] MRN: [id number] Date of Birth: 1941/04/12   Important Message Given:  Yes - Medicare IM     Anise Kerns 10/19/2023, 12:57 PM

## 2023-10-19 NOTE — Progress Notes (Signed)
 Date of Admission:  10/16/2023      ID: [name] is a 83 y.o. male Principal Problem:   Severe sepsis with acute organ dysfunction (HCC) Active Problems:   BPH (benign prostatic hyperplasia)   Symptomatic anemia   Mixed hyperlipidemia   Hypothyroidism   Hyperkalemia   Acute kidney injury (HCC)   CKD (chronic kidney disease) stage 4, GFR 15-29 ml/min (HCC)   History of CVA (cerebrovascular accident)   Traumatic subarachnoid hemorrhage (HCC)   Persistent atrial fibrillation (HCC)   Mild cognitive impairment   Cough   Right lower lobe pneumonia   Weakness   Hypomagnesemia   Palliative care encounter    Subjective: Pt feeling better Says no sob Some cough which is productive No fever No chest pain Son at bedside  Medications:   amiodarone   200 mg Oral Daily   cyanocobalamin   1,000 mcg Oral Daily   DULoxetine   20 mg Oral QHS   furosemide   40 mg Intravenous Daily   levothyroxine   100 mcg Oral Q0600   metoprolol  tartrate  25 mg Oral BID   pantoprazole   40 mg Oral Daily   simvastatin   40 mg Oral QHS    Objective: Vital signs in last 24 hours: Patient Vitals for the past 24 hrs:  BP Temp Temp src Pulse Resp SpO2  10/19/23 1626 118/71 97.7 F (36.5 C) -- 66 -- 96 %  10/19/23 1551 124/82 98.2 F (36.8 C) Oral (!) 48 20 99 %  10/19/23 1122 (!) 130/57 97.8 F (36.6 C) -- 82 -- 98 %  10/19/23 0816 119/80 -- -- 95 -- 98 %  10/19/23 0311 118/69 97.9 F (36.6 C) -- 82 20 99 %  10/18/23 2309 124/78 98.1 F (36.7 C) Oral 86 20 99 %  10/18/23 1953 139/62 98.3 F (36.8 C) -- (!) 49 18 97 %      PHYSICAL EXAM:  General: Alert, cooperative, no distress, appears stated age. pale Lungs: b/l air entry- decreased rt base- crepts rt. Heart: irregular Abdomen: Soft, non-tender,not distended. Bowel sounds normal. No masses Extremities: atraumatic, no cyanosis. No edema. No clubbing Skin: No rashes or lesions. Or bruising Lymph: Cervical, supraclavicular  normal. Neurologic: Grossly non-focal  Lab Results    Latest Ref Rng & Units 10/19/2023    3:58 AM 10/18/2023    6:31 PM 10/18/2023    2:02 AM  CBC  WBC 4.0 - 10.5 K/uL 3.3   2.0   Hemoglobin 13.0 - 17.0 g/dL 8.2  9.3  7.1   Hematocrit 39.0 - 52.0 % 26.0  28.6  22.5   Platelets 150 - 400 K/uL 79   70        Latest Ref Rng & Units 10/19/2023    3:58 AM 10/18/2023    2:02 AM 10/17/2023    5:50 AM  CMP  Glucose 70 - 99 mg/dL 865  96  89   BUN 8 - 23 mg/dL 55  57  55   Creatinine 0.61 - 1.24 mg/dL 7.84  6.96  2.95   Sodium 135 - 145 mmol/L 135  133  135   Potassium 3.5 - 5.1 mmol/L 4.1  4.5  4.5   Chloride 98 - 111 mmol/L 104  106  104   CO2 22 - 32 mmol/L 22  19  20    Calcium  8.9 - 10.3 mg/dL 7.3  6.9  7.1       Microbiology: 10/16/2023 Pseudomonas bacteremia 1 out of 2 10/19/2023 blood  cultures have been repeated Studies/Results: Chest x-ray right lower lobe consolidation Right small pleural effusion   Assessment/Plan:   Pseudomonas bacteremia.  Has a pneumonia on the right side. This is nosocomial in hospitalization and discharged on 10/10/2023 because of recent Patient is currently on cefepime  Cannot use quinolones because of ascending aortic aneurysm of 4 cm and also prolonged QT and patient is on amiodarone  so we will have to get IV antibiotics to complete a course of 2 weeks. Will send sputum for culture  Myelofibrosis with pancytopenia  A-fib on amiodarone  and metoprolol   CKD  History of right shoulder reverse arthroplasty  History of falls with recent subarachnoid hemorrhages Discussed the management with the patient and his son

## 2023-10-19 NOTE — Progress Notes (Signed)
 PROGRESS NOTE    [name]   ZOX:[id number] DOB: 06-27-1940  DOA: 10/16/2023 Date of Service: 10/19/23 which is hospital day 3  PCP: [name], [facility] course / significant events:   HPI: [name] is a 83 year old male with history of traumatic subarachnoid hemorrhage, myelofibrosis, symptomatic anemia, persistent atrial fibrillation, mild cognitive impairment, hypothyroid, GERD, BPH, hyperlipidemia, hypothyroid, who presents emergency department for chief concerns of cough and wheezing.  05/12: admitted to hospitalist for sepsis d/t RLL pneumonia, AKI, hyperkalemia, HFpEF exacerbation. Started abx, diuresis  05/13: diuresing, (+)Bcx Pseudomonas, continue abx, 05/14: Cr slowly improving. Briefly Afib RVR but this improved w/ diltiazem IV x1 and increased beta blocker, resumed home amiodarone . Palliative care and iD consult. Per ID, plan repeat BCx following 48h abx, continue cefepime   05/15: HR low overnight, soft BP - reduced beta blocker. Repeat BCx drawn today.       Consultants:  Infectious disease  Palliative care   Procedures/Surgeries: none      ASSESSMENT & PLAN:   Severe sepsis with acute organ dysfunction - improved  [name] has increased respiration rate, leukopenia, and source of infection is right lower lobe pneumonia and bacteremia Treat underlying cause(s) below    Acute hypoxemic respiratory failure 2/2 PNA and CHF.  CXR with Findings suggesting congestive changes superimposed right lower lobe pneumonia and right pleural effusion. O2 support as needed   Treat underlying cause(s) below  Right lower lobe pneumonia Pseudomonal bacteremia cefepime  since pseudomonas is the likely causative agent of pt's PNA. ID following   Afib RVR - resolved now in NSR RVR on tele 10/18/23 reduced metoprolol  back down today  continue home amiodarone   Acute dCHF exacerbation BNP 1435, up from 168 about 4 months ago.  CXR with finding of  CHF. IV lasix  40 mg daily - continue pending reduced output / increased Cr  Strict Intake/output Daily weights   Acute kidney injury on CKD4 Cr 3.7 on presentation, up from 3.03 on 10/10/23.   monitor Cr while diuresing   Hyperkalemia - resolved  In setting of acute kidney injury Monitor BMP   Hypomagnesemia monitor and supplement PRN   Weakness --PT   Traumatic subarachnoid hemorrhage  In April 2025, [name] was discharged to medicine rehabilitation and was discharged from that service on 10/10/2023 No anticoagulation   Hypothyroidism levothyroxine  100 mcg daily    Chronic anemia S/p PRBC transfusion 1 unit 10/18/23 monitor Hgb  transfuse to keep Hgb >7   Myelofibrosis  hold home revlimid , per pt's oncologist Dr. Randy Francis Palliative care following     N/a based on BMI: There is no height or weight on file to calculate BMI.. Significantly low or high BMI is associated with higher medical risk.  Underweight - under 18  overweight - 25 to 29 obese - 30 or more Class 1 obesity: BMI of 30.0 to 34 Class 2 obesity: BMI of 35.0 to 39 Class 3 obesity: BMI of 40.0 to 49 Super Morbid Obesity: BMI 50-59 Super-super Morbid Obesity: BMI 60+ Healthy nutrition and physical activity advised as adjunct to other disease management and risk reduction treatments    DVT prophylaxis: no anticoagulation d/r SAH IV fluids: no continuous IV fluids  Nutrition: cardiac Central lines / other devices: none  Code Status: FULL CODE - see palliative note today 10/18/23  ACP documentation reviewed: none on file in [facility]  TOC needs: TBD Medical barriers to dispo: ID recs, CHF treating. Expected medical readiness for discharge several  more days.              Subjective / Brief ROS:  [name] has no concerns today states he is feeling  abit better  No CP/SOB Pain controlled.  Denies new weakness.  Tolerating diet.  Reports no concerns w/ urination/defecation.   Family Communication:  none at this time     Objective Findings:  Vitals:   10/18/23 2309 10/19/23 0311 10/19/23 0816 10/19/23 1122  BP: 124/78 118/69 119/80 (!) 130/57  Pulse: 86 82 95 82  Resp: 20 20    Temp: 98.1 F (36.7 C) 97.9 F (36.6 C)  97.8 F (36.6 C)  TempSrc: Oral     SpO2: 99% 99% 98% 98%    Intake/Output Summary (Last 24 hours) at 10/19/2023 1443 Last data filed at 10/19/2023 1100 Gross per 24 hour  Intake 688.83 ml  Output 250 ml  Net 438.83 ml   There were no vitals filed for this visit.  Examination:  Physical Exam Constitutional:      General: He is not in acute distress. Cardiovascular:     Rate and Rhythm: Tachycardia present. Rhythm irregular.  Pulmonary:     Effort: Pulmonary effort is normal. No tachypnea or respiratory distress.     Breath sounds: Examination of the right-lower field reveals decreased breath sounds. Examination of the left-lower field reveals decreased breath sounds. Decreased breath sounds, rhonchi and rales (very faint rales / atelectasis) present. No wheezing.  Musculoskeletal:     Right lower leg: No edema.     Left lower leg: No edema.  Neurological:     General: No focal deficit present.     Mental Status: He is alert and oriented to person, place, and time.  Psychiatric:        Mood and Affect: Mood normal.        Behavior: Behavior normal.          Scheduled Medications:   amiodarone   200 mg Oral Daily   cyanocobalamin   1,000 mcg Oral Daily   DULoxetine   20 mg Oral QHS   furosemide   40 mg Intravenous Daily   levothyroxine   100 mcg Oral Q0600   metoprolol  tartrate  25 mg Oral BID   pantoprazole   40 mg Oral Daily   simvastatin   40 mg Oral QHS    Continuous Infusions:  ceFEPime  (MAXIPIME ) IV 1 g (10/19/23 0919)    PRN Medications:  acetaminophen  **OR** acetaminophen , ondansetron  **OR** ondansetron  (ZOFRAN ) IV, QUEtiapine , senna-docusate, traMADol   Antimicrobials from admission:  Anti-infectives (From admission, onward)     Start     Dose/Rate Route Frequency Ordered Stop   10/18/23 1000  ceFEPIme  (MAXIPIME ) 1 g in sodium chloride  0.9 % 100 mL IVPB        1 g 200 mL/hr over 30 Minutes Intravenous Every 12 hours 10/18/23 0831     10/17/23 1200  ceFEPIme  (MAXIPIME ) 2 g in sodium chloride  0.9 % 100 mL IVPB  Status:  Discontinued        2 g 200 mL/hr over 30 Minutes Intravenous Every 24 hours 10/16/23 1338 10/18/23 0831   10/17/23 1000  azithromycin (ZITHROMAX) 500 mg in sodium chloride  0.9 % 250 mL IVPB  Status:  Discontinued        500 mg 250 mL/hr over 60 Minutes Intravenous Every 24 hours 10/16/23 1307 10/17/23 0836   10/17/23 0800  vancomycin  variable dose per unstable renal function (pharmacist dosing)  Status:  Discontinued  Does not apply See admin instructions 10/16/23 1344 10/17/23 0836   10/16/23 1330  vancomycin  (VANCOREADY) IVPB 1500 mg/300 mL        1,500 mg 150 mL/hr over 120 Minutes Intravenous  Once 10/16/23 1321 10/16/23 1706   10/16/23 1300  vancomycin  (VANCOCIN ) IVPB 1000 mg/200 mL premix  Status:  Discontinued        1,000 mg 200 mL/hr over 60 Minutes Intravenous  Once 10/16/23 1252 10/16/23 1321   10/16/23 1300  ceFEPIme  (MAXIPIME ) 2 g in sodium chloride  0.9 % 100 mL IVPB        2 g 200 mL/hr over 30 Minutes Intravenous  Once 10/16/23 1252 10/16/23 1341   10/16/23 1300  azithromycin (ZITHROMAX) 500 mg in sodium chloride  0.9 % 250 mL IVPB        500 mg 250 mL/hr over 60 Minutes Intravenous  Once 10/16/23 1252 10/16/23 1518           Data Reviewed:  I have personally reviewed the following...  CBC: Recent Labs  Lab 10/16/23 1028 10/17/23 0550 10/18/23 0202 10/18/23 1831 10/19/23 0358  WBC 2.0* 2.1* 2.0*  --  3.3*  NEUTROABS 1.5*  --   --   --   --   HGB 8.0* 7.1* 7.1* 9.3* 8.2*  HCT 25.8* 22.6* 22.5* 28.6* 26.0*  MCV 93.1 90.4 88.6  --  90.0  PLT 76* 65* 70*  --  79*   Basic Metabolic Panel: Recent Labs  Lab 10/16/23 1028 10/16/23 1031 10/17/23 0007  10/17/23 0550 10/18/23 0202 10/19/23 0358  NA 137  --   --  135 133* 135  K 5.5*  --  4.7 4.5 4.5 4.1  CL 105  --   --  104 106 104  CO2 23  --   --  20* 19* 22  GLUCOSE 90  --   --  89 96 111*  BUN 58*  --   --  55* 57* 55*  CREATININE 3.70*  --   --  3.50* 3.45* 3.43*  CALCIUM  7.5*  --   --  7.1* 6.9* 7.3*  MG  --  1.5*  --  1.6* 2.1 2.0   GFR: Estimated Creatinine Clearance: 17.3 mL/min (A) (by C-G formula based on SCr of 3.43 mg/dL (H)). Liver Function Tests: Recent Labs  Lab 10/16/23 1028  AST 24  ALT 17  ALKPHOS 65  BILITOT 1.7*  PROT 5.1*  ALBUMIN  2.3*   No results for input(s): "LIPASE", "AMYLASE" in the last 168 hours. No results for input(s): "AMMONIA" in the last 168 hours. Coagulation Profile: No results for input(s): "INR", "PROTIME" in the last 168 hours. Cardiac Enzymes: Recent Labs  Lab 10/16/23 1031  CKTOTAL 77   BNP (last 3 results) No results for input(s): "PROBNP" in the last 8760 hours. HbA1C: No results for input(s): "HGBA1C" in the last 72 hours. CBG: No results for input(s): "GLUCAP" in the last 168 hours. Lipid Profile: No results for input(s): "CHOL", "HDL", "LDLCALC", "TRIG", "CHOLHDL", "LDLDIRECT" in the last 72 hours. Thyroid  Function Tests: No results for input(s): "TSH", "T4TOTAL", "FREET4", "T3FREE", "THYROIDAB" in the last 72 hours. Anemia Panel: No results for input(s): "VITAMINB12", "FOLATE", "FERRITIN", "TIBC", "IRON", "RETICCTPCT" in the last 72 hours. Most Recent Urinalysis On File:     Component Value Date/Time   COLORURINE YELLOW 09/21/2023 1814   APPEARANCEUR CLEAR 09/21/2023 1814   APPEARANCEUR Clear 08/04/2023 1507   LABSPEC 1.019 09/21/2023 1814   PHURINE 5.0 09/21/2023 1814  GLUCOSEU NEGATIVE 09/21/2023 1814   HGBUR NEGATIVE 09/21/2023 1814   BILIRUBINUR NEGATIVE 09/21/2023 1814   BILIRUBINUR Negative 08/04/2023 1507   KETONESUR NEGATIVE 09/21/2023 1814   PROTEINUR 100 (A) 09/21/2023 1814   NITRITE NEGATIVE  09/21/2023 1814   LEUKOCYTESUR NEGATIVE 09/21/2023 1814   Sepsis Labs: @LABRCNTIP (procalcitonin:4,lacticidven:4) Microbiology: Recent Results (from the past 240 hours)  Culture, blood (routine x 2)     Status: Abnormal   Collection Time: 10/16/23 10:32 AM   Specimen: BLOOD  Result Value Ref Range Status   Specimen Description   Final    BLOOD RIGHT ANTECUBITAL Performed at [facility], [address] [place], [place] [address]    Special Requests   Final    BOTTLES DRAWN AEROBIC AND ANAEROBIC Blood Culture adequate volume Performed at [facility], [address], [place], [place] [address]    Culture  Setup Time   Final    GRAM NEGATIVE RODS AEROBIC BOTTLE ONLY CRITICAL RESULT CALLED TO, READ BACK BY AND VERIFIED WITHNoe Bath AT 9629 10/17/23 JG Performed at [facility], [address] [place], [place] [address]    Culture PSEUDOMONAS AERUGINOSA (A)  Final   Report Status 10/19/2023 FINAL  Final   Organism ID, Bacteria PSEUDOMONAS AERUGINOSA  Final      Susceptibility   Pseudomonas aeruginosa - MIC*    CEFTAZIDIME 2 SENSITIVE Sensitive     CIPROFLOXACIN  0.5 SENSITIVE Sensitive     GENTAMICIN  <=1 SENSITIVE Sensitive     IMIPENEM 2 SENSITIVE Sensitive     PIP/TAZO 8 SENSITIVE Sensitive ug/mL    CEFEPIME  2 SENSITIVE Sensitive     * PSEUDOMONAS AERUGINOSA  Blood Culture ID Panel (Reflexed)     Status: Abnormal   Collection Time: 10/16/23 10:32 AM  Result Value Ref Range Status   Enterococcus faecalis NOT DETECTED NOT DETECTED Final   Enterococcus Faecium NOT DETECTED NOT DETECTED Final   Listeria monocytogenes NOT DETECTED NOT DETECTED Final   Staphylococcus species NOT DETECTED NOT DETECTED Final   Staphylococcus aureus (BCID) NOT DETECTED NOT DETECTED Final   Staphylococcus epidermidis NOT DETECTED NOT DETECTED Final   Staphylococcus lugdunensis NOT DETECTED NOT DETECTED Final   Streptococcus species NOT DETECTED NOT DETECTED Final    Streptococcus agalactiae NOT DETECTED NOT DETECTED Final   Streptococcus pneumoniae NOT DETECTED NOT DETECTED Final   Streptococcus pyogenes NOT DETECTED NOT DETECTED Final   A.calcoaceticus-baumannii NOT DETECTED NOT DETECTED Final   Bacteroides fragilis NOT DETECTED NOT DETECTED Final   Enterobacterales NOT DETECTED NOT DETECTED Final   Enterobacter cloacae complex NOT DETECTED NOT DETECTED Final   Escherichia coli NOT DETECTED NOT DETECTED Final   Klebsiella aerogenes NOT DETECTED NOT DETECTED Final   Klebsiella oxytoca NOT DETECTED NOT DETECTED Final   Klebsiella pneumoniae NOT DETECTED NOT DETECTED Final   Proteus species NOT DETECTED NOT DETECTED Final   Salmonella species NOT DETECTED NOT DETECTED Final   Serratia marcescens NOT DETECTED NOT DETECTED Final   Haemophilus influenzae NOT DETECTED NOT DETECTED Final   Neisseria meningitidis NOT DETECTED NOT DETECTED Final   Pseudomonas aeruginosa DETECTED (A) NOT DETECTED Final    Comment: CRITICAL RESULT CALLED TO, READ BACK BY AND VERIFIED WITH:  NATHAN BELUE AT 3244 10/17/23 JG    Stenotrophomonas maltophilia NOT DETECTED NOT DETECTED Final   Candida albicans NOT DETECTED NOT DETECTED Final   Candida auris NOT DETECTED NOT DETECTED Final   Candida glabrata NOT DETECTED NOT DETECTED Final   Candida  krusei NOT DETECTED NOT DETECTED Final   Candida parapsilosis NOT DETECTED NOT DETECTED Final   Candida tropicalis NOT DETECTED NOT DETECTED Final   Cryptococcus neoformans/gattii NOT DETECTED NOT DETECTED Final   CTX-M ESBL NOT DETECTED NOT DETECTED Final   Carbapenem resistance IMP NOT DETECTED NOT DETECTED Final   Carbapenem resistance KPC NOT DETECTED NOT DETECTED Final   Carbapenem resistance NDM NOT DETECTED NOT DETECTED Final   Carbapenem resistance VIM NOT DETECTED NOT DETECTED Final    Comment: Performed at [facility], [address] [place], [place] [address]  Resp panel by RT-PCR (RSV, Flu A&B, Covid) Anterior  Nasal Swab     Status: None   Collection Time: 10/16/23  1:04 PM   Specimen: Anterior Nasal Swab  Result Value Ref Range Status   SARS Coronavirus 2 by RT PCR NEGATIVE NEGATIVE Final    Comment: (NOTE) SARS-CoV-2 target nucleic acids are NOT DETECTED.  The SARS-CoV-2 RNA is generally detectable in upper respiratory specimens during the acute phase of infection. The lowest concentration of SARS-CoV-2 viral copies this assay can detect is 138 copies/mL. A negative result does not preclude SARS-Cov-2 infection and should not be used as the sole basis for treatment or other [name] management decisions. A negative result may occur with  improper specimen collection/handling, submission of specimen other than nasopharyngeal swab, presence of viral mutation(s) within the areas targeted by this assay, and inadequate number of viral copies(<138 copies/mL). A negative result must be combined with clinical observations, [name] history, and epidemiological information. The expected result is Negative.  Fact Sheet for Patients:  BloggerCourse.com  Fact Sheet for Healthcare Providers:  SeriousBroker.it  This test is no t yet approved or cleared by the [place]  FDA and  has been authorized for detection and/or diagnosis of SARS-CoV-2 by FDA under an Emergency Use Authorization (EUA). This EUA will remain  in effect (meaning this test can be used) for the duration of the COVID-19 declaration under Section 564(b)(1) of the Act, 21 U.S.C.section 360bbb-3(b)(1), unless the authorization is terminated  or revoked sooner.       Influenza A by PCR NEGATIVE NEGATIVE Final   Influenza B by PCR NEGATIVE NEGATIVE Final    Comment: (NOTE) The Xpert Xpress SARS-CoV-2/FLU/RSV plus assay is intended as an aid in the diagnosis of influenza from Nasopharyngeal swab specimens and should not be used as a sole basis for treatment. Nasal washings  and aspirates are unacceptable for Xpert Xpress SARS-CoV-2/FLU/RSV testing.  Fact Sheet for Patients: BloggerCourse.com  Fact Sheet for Healthcare Providers: SeriousBroker.it  This test is not yet approved or cleared by the [place]  FDA and has been authorized for detection and/or diagnosis of SARS-CoV-2 by FDA under an Emergency Use Authorization (EUA). This EUA will remain in effect (meaning this test can be used) for the duration of the COVID-19 declaration under Section 564(b)(1) of the Act, 21 U.S.C. section 360bbb-3(b)(1), unless the authorization is terminated or revoked.     Resp Syncytial Virus by PCR NEGATIVE NEGATIVE Final    Comment: (NOTE) Fact Sheet for Patients: BloggerCourse.com  Fact Sheet for Healthcare Providers: SeriousBroker.it  This test is not yet approved or cleared by the [place]  FDA and has been authorized for detection and/or diagnosis of SARS-CoV-2 by FDA under an Emergency Use Authorization (EUA). This EUA will remain in effect (meaning this test can be used) for the duration of the COVID-19 declaration under Section 564(b)(1) of the Act, 21 U.S.C. section 360bbb-3(b)(1), unless the authorization  is terminated or revoked.  Performed at [facility], [address] [place], [place] [address]   Culture, blood (routine x 2)     Status: None (Preliminary result)   Collection Time: 10/17/23 12:07 AM   Specimen: BLOOD RIGHT ARM  Result Value Ref Range Status   Specimen Description BLOOD RIGHT ARM  Final   Special Requests   Final    BOTTLES DRAWN AEROBIC AND ANAEROBIC Blood Culture adequate volume   Culture   Final    NO GROWTH 2 DAYS Performed at [facility], [address], [place], [place] [address]    Report Status PENDING  Incomplete  MRSA Next Gen by PCR, Nasal     Status: None   Collection Time: 10/17/23   9:08 PM   Specimen: Nasal Mucosa; Nasal Swab  Result Value Ref Range Status   MRSA by PCR Next Gen NOT DETECTED NOT DETECTED Final    Comment: (NOTE) The GeneXpert MRSA Assay (FDA approved for NASAL specimens only), is one component of a comprehensive MRSA colonization surveillance program. It is not intended to diagnose MRSA infection nor to guide or monitor treatment for MRSA infections. Test performance is not FDA approved in patients less than 34 years old. Performed at [facility], [address] [place], [place] [address]       Radiology Studies last 3 days: DG Chest Portable 1 View Result Date: 10/16/2023 CLINICAL DATA:  Shortness of breath EXAM: PORTABLE CHEST 1 VIEW COMPARISON:  Oct 06, 2023 FINDINGS: Worsening right lung infiltrates. Right lower lobe consolidation. Right small pleural effusion Prominence interstitial markings left lung. Findings could correlate with congestive changes superimposed right lower lobe pneumonia and right pleural effusion Total right shoulder arthroplasty IMPRESSION: Worsening right lung infiltrates. Right lower lobe consolidation. Right small pleural effusion. Findings could correlate with congestive changes superimposed right lower lobe pneumonia and right pleural effusion. Electronically Signed   By: Fredrich Jefferson M.D.   On: 10/16/2023 11:38          Quindell Shere, DO [facility] 10/19/2023, 2:43 PM    Dictation software may have been used to generate the above note. Typos may occur and escape review in typed/dictated notes. Please contact Dr Authur Leghorn directly for clarity if needed.  Staff may message me via secure chat in Epic  but this may not receive an immediate response,  please page me for urgent matters!  If 7PM-7AM, please contact night coverage [email or web address]

## 2023-10-20 DIAGNOSIS — A419 Sepsis, unspecified organism: Secondary | ICD-10-CM | POA: Diagnosis not present

## 2023-10-20 DIAGNOSIS — B965 Pseudomonas (aeruginosa) (mallei) (pseudomallei) as the cause of diseases classified elsewhere: Secondary | ICD-10-CM | POA: Diagnosis not present

## 2023-10-20 DIAGNOSIS — R6520 Severe sepsis without septic shock: Secondary | ICD-10-CM | POA: Diagnosis not present

## 2023-10-20 DIAGNOSIS — J151 Pneumonia due to Pseudomonas: Secondary | ICD-10-CM | POA: Diagnosis not present

## 2023-10-20 DIAGNOSIS — R7881 Bacteremia: Secondary | ICD-10-CM | POA: Diagnosis not present

## 2023-10-20 LAB — HEPATIC FUNCTION PANEL
ALT: 16 U/L (ref 0–44)
AST: 13 U/L — ABNORMAL LOW (ref 15–41)
Albumin: 2 g/dL — ABNORMAL LOW (ref 3.5–5.0)
Alkaline Phosphatase: 60 U/L (ref 38–126)
Bilirubin, Direct: 0.2 mg/dL (ref 0.0–0.2)
Indirect Bilirubin: 1 mg/dL — ABNORMAL HIGH (ref 0.3–0.9)
Total Bilirubin: 1.2 mg/dL (ref 0.0–1.2)
Total Protein: 4.7 g/dL — ABNORMAL LOW (ref 6.5–8.1)

## 2023-10-20 LAB — BASIC METABOLIC PANEL WITH GFR
Anion gap: 10 (ref 5–15)
BUN: 56 mg/dL — ABNORMAL HIGH (ref 8–23)
CO2: 22 mmol/L (ref 22–32)
Calcium: 7.6 mg/dL — ABNORMAL LOW (ref 8.9–10.3)
Chloride: 105 mmol/L (ref 98–111)
Creatinine, Ser: 3.48 mg/dL — ABNORMAL HIGH (ref 0.61–1.24)
GFR, Estimated: 17 mL/min — ABNORMAL LOW (ref >60)
Glucose, Bld: 96 mg/dL (ref 70–99)
Potassium: 3.7 mmol/L (ref 3.5–5.1)
Sodium: 137 mmol/L (ref 135–145)

## 2023-10-20 LAB — CBC
HCT: 25 % — ABNORMAL LOW (ref 39.0–52.0)
Hemoglobin: 8.2 g/dL — ABNORMAL LOW (ref 13.0–17.0)
MCH: 29.4 pg (ref 26.0–34.0)
MCHC: 32.8 g/dL (ref 30.0–36.0)
MCV: 89.6 fL (ref 80.0–100.0)
Platelets: 78 10*3/uL — ABNORMAL LOW (ref 150–400)
RBC: 2.79 MIL/uL — ABNORMAL LOW (ref 4.22–5.81)
RDW: 17.7 % — ABNORMAL HIGH (ref 11.5–15.5)
WBC: 3 10*3/uL — ABNORMAL LOW (ref 4.0–10.5)
nRBC: 0 % (ref 0.0–0.2)

## 2023-10-20 LAB — MAGNESIUM: Magnesium: 1.8 mg/dL (ref 1.7–2.4)

## 2023-10-20 MED ORDER — CEFEPIME IV (FOR PTA / DISCHARGE USE ONLY): 2.0000 g | INTRAVENOUS | 0 refills | Status: DC

## 2023-10-20 MED ORDER — CEFEPIME IV (FOR PTA / DISCHARGE USE ONLY)
1.0000 g | Freq: Two times a day (BID) | INTRAVENOUS | 0 refills | Status: AC
Start: 2023-10-20 — End: 2023-10-30

## 2023-10-20 MED ORDER — FUROSEMIDE 40 MG PO TABS
40.0000 mg | ORAL_TABLET | Freq: Two times a day (BID) | ORAL | Status: DC
  Administered 2023-10-21 – 2023-10-24 (×8): 40 mg via ORAL
  Filled 2023-10-20 (×8): qty 1

## 2023-10-20 NOTE — Progress Notes (Signed)
 Date of Admission:  10/16/2023      ID: [name] is a 83 y.o. male Principal Problem:   Severe sepsis with acute organ dysfunction (HCC) Active Problems:   BPH (benign prostatic hyperplasia)   Symptomatic anemia   Mixed hyperlipidemia   Hypothyroidism   Hyperkalemia   Acute kidney injury (HCC)   CKD (chronic kidney disease) stage 4, GFR 15-29 ml/min (HCC)   History of CVA (cerebrovascular accident)   Traumatic subarachnoid hemorrhage (HCC)   Persistent atrial fibrillation (HCC)   Mild cognitive impairment   Cough   Right lower lobe pneumonia   Weakness   Hypomagnesemia   Palliative care encounter   Bacteremia due to Pseudomonas   Pancytopenia (HCC)    Subjective: Pt is feeling better Walked in the corridor with PT Sat in chair  Medications:   amiodarone   200 mg Oral Daily   cyanocobalamin   1,000 mcg Oral Daily   DULoxetine   20 mg Oral QHS   furosemide   40 mg Intravenous Daily   levothyroxine   100 mcg Oral Q0600   metoprolol  tartrate  25 mg Oral BID   pantoprazole   40 mg Oral Daily   simvastatin   40 mg Oral QHS    Objective: Vital signs in last 24 hours: Patient Vitals for the past 24 hrs:  BP Temp Temp src Pulse Resp SpO2  10/20/23 1105 (!) 101/59 98.4 F (36.9 C) -- 90 18 97 %  10/20/23 0859 130/79 98.1 F (36.7 C) -- 95 18 97 %  10/20/23 0349 95/61 (!) 97.5 F (36.4 C) -- 84 16 96 %  10/19/23 2348 (!) 98/56 97.6 F (36.4 C) -- 83 16 96 %  10/19/23 2054 128/82 -- -- 78 -- --  10/19/23 2052 128/82 97.9 F (36.6 C) Oral 78 18 98 %  10/19/23 1626 118/71 97.7 F (36.5 C) -- 66 -- 96 %  10/19/23 1551 124/82 98.2 F (36.8 C) Oral (!) 48 20 99 %      PHYSICAL EXAM:  General: Alert, cooperative, no distress, pale Lungs: b/l air entry Heart: irregular Abdomen: Soft, non-tender,not distended. Bowel sounds normal. No masses Extremities: atraumatic, no cyanosis. No edema. No clubbing Skin: No rashes or lesions. Or bruising Lymph: Cervical,  supraclavicular normal. Neurologic: Grossly non-focal  Lab Results    Latest Ref Rng & Units 10/20/2023    4:47 AM 10/19/2023    3:58 AM 10/18/2023    6:31 PM  CBC  WBC 4.0 - 10.5 K/uL 3.0  3.3    Hemoglobin 13.0 - 17.0 g/dL 8.2  8.2  9.3   Hematocrit 39.0 - 52.0 % 25.0  26.0  28.6   Platelets 150 - 400 K/uL 78  79         Latest Ref Rng & Units 10/20/2023    4:47 AM 10/19/2023    3:58 AM 10/18/2023    2:02 AM  CMP  Glucose 70 - 99 mg/dL 96  621  96   BUN 8 - 23 mg/dL 56  55  57   Creatinine 0.61 - 1.24 mg/dL 3.08  6.57  8.46   Sodium 135 - 145 mmol/L 137  135  133   Potassium 3.5 - 5.1 mmol/L 3.7  4.1  4.5   Chloride 98 - 111 mmol/L 105  104  106   CO2 22 - 32 mmol/L 22  22  19    Calcium  8.9 - 10.3 mg/dL 7.6  7.3  6.9       Microbiology:  10/16/2023 Pseudomonas bacteremia 1 out of 2 10/19/2023 blood cultures NG 5/15 Sputum culture sent Studies/Results: Chest x-ray right lower lobe consolidation Right small pleural effusion   Assessment/Plan:   Pseudomonas bacteremia.  With pneumonia rt lung Sputum culture sent Repeat blood culture sent on 5/15  no growth so far Recent hospitalization in [facility] when he had rt pleural aspiration Pt is on cefepime  Cannot use quinolones because of ascending aortic aneurysm of 4 cm and also prolonged QT and patient is on amiodarone  so we will have to get IV antibiotics to complete a course of 2 weeks.until 10/30/23 Will send sputum for culture  Myelofibrosis  Has  pancytopenia  A-fib on amiodarone  and metoprolol   CKD- cefepime  dose adjusted for crcl  History of right shoulder reverse arthroplasty  History of falls with recent subarachnoid hemorrhages Discussed the management with the patient and hospitalist OPAT order written

## 2023-10-20 NOTE — Progress Notes (Signed)
 PHARMACY CONSULT NOTE FOR:  OUTPATIENT  PARENTERAL ANTIBIOTIC THERAPY (OPAT)  Indication: Pseudomonas bacteremia Regimen: Cefepime  1 g IV q12h End date: 10/30/23  IV antibiotic discharge orders are signed per transcription from Dr. Tery Fielding.   Thank you for allowing pharmacy to be a part of this patient's care.  Saunders Curio 10/20/2023, 3:38 PM

## 2023-10-20 NOTE — Consult Note (Signed)
 Hematology/Oncology Consult note [facility] [phone] [phone]  Patient Care Team: [name], DO as PCP - General (Family Medicine) Jerelene Monday Deadra Everts, MD as PCP - Cardiology (Cardiology) Hillery Lown, MD as Referring Physician (Hematology) Rexene Catching, MD as Consulting Physician (Ophthalmology) Lisabeth Rider, MD as Consulting Physician (Neurology) Christina Coyer, MD as Consulting Physician (Urology) Jerelene Monday Deadra Everts, MD as Consulting Physician (Cardiology) Geraline Knapp, MD (Urology) Avonne Boettcher, MD as Consulting Physician (Hematology and Oncology) Avonne Boettcher, MD as Consulting Physician (Oncology) Grafton Lawrence, RN (Inactive) as [facility] Care Management   Name of the patient: [name]  [id number]  1941-04-05    Reason for consult: History of MDS with pancytopenia admitted for healthcare associated pneumonia   Requesting physician: Dr. 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  He was seen by me as an outpatient last in March 2025 and at that point I had discussed starting Revlimid  for him.  Subsequently patient has had multiple hospitalizations for fall and head bleed and now presently admitted for healthcare associated pneumonia.  His Eliquis  has been mostly on hold during these hospitalizations.  ECOG PS- 3  Pain scale- 4   Review of systems- Review of Systems  Constitutional:  Positive for malaise/fatigue. Negative for chills, fever and weight loss.  HENT:  Negative for congestion, ear discharge and nosebleeds.   Eyes:  Negative for blurred vision.  Respiratory:  Negative for cough, hemoptysis, sputum production, shortness of breath and wheezing.   Cardiovascular:  Negative for chest pain, palpitations, orthopnea  and claudication.  Gastrointestinal:  Negative for abdominal pain, blood in stool, constipation, diarrhea, heartburn, melena, nausea and vomiting.  Genitourinary:  Negative for dysuria, flank pain, frequency, hematuria and urgency.  Musculoskeletal:  Negative for back pain, joint pain and myalgias.  Skin:  Negative for rash.  Neurological:  Negative for dizziness, tingling, focal weakness, seizures, weakness and headaches.  Endo/Heme/Allergies:  Does not bruise/bleed easily.  Psychiatric/Behavioral:  Negative for depression and suicidal ideas. The patient does not have insomnia.     Allergies  Allergen Reactions   Quinolones Other (See Comments)    Patient with aortic aneurysm. Use of quinolones are considered to be contraindicated due to risk of aortic rupture.   Meloxicam Nausea And Vomiting    Patient Active Problem List   Diagnosis Date Noted   Bacteremia due to Pseudomonas 10/19/2023   Pancytopenia (HCC) 10/19/2023   Palliative care encounter 10/18/2023   Severe sepsis with acute organ dysfunction (HCC) 10/16/2023   Cough 10/16/2023   Right lower lobe pneumonia 10/16/2023   Weakness 10/16/2023   Hypomagnesemia 10/16/2023   Paroxysmal atrial fibrillation (HCC) 10/10/2023   Mild cognitive impairment 10/09/2023   Persistent atrial fibrillation (HCC) 10/06/2023   Malnutrition of moderate degree 10/03/2023   Traumatic subarachnoid hemorrhage (HCC) 09/29/2023   Subarachnoid hemorrhage following injury (HCC) 09/21/2023   CKD (chronic kidney disease) stage 4, GFR 15-29 ml/min (HCC) 06/29/2023   Pleural effusion 05/30/2023   Atrial flutter (HCC) 05/23/2023   History of CVA (cerebrovascular accident) 05/23/2023   Hypernatremia 05/23/2023   Pericardial effusion 05/23/2023   Pericarditis 05/23/2023   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   Overweight (BMI 25.0-29.9) 05/05/2023   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, 41-49%) (HCC)  05/05/2023   Acute kidney injury superimposed on chronic kidney disease (HCC) 05/04/2023   Atrial fibrillation with rapid ventricular response (HCC) 05/04/2023   Normocytic anemia 05/03/2023   ST elevation myocardial infarction (STEMI) (HCC) 05/03/2023   Tachycardia 05/03/2023   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/2023   Acute pericarditis 05/02/2023   Altered mental status 04/21/2023   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, of the knee, left 04/21/2023   Knee effusion, left 04/21/2023   Acute renal failure with acute 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) 04/17/2023   History of melanoma 04/17/2023   S/P reverse total shoulder arthroplasty, 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 04/17/2023   Hallucinations 04/17/2023   Hyperkalemia 04/17/2023   Acute pain of left knee 04/17/2023   Delirium 04/17/2023   Acute kidney injury (HCC) 04/17/2023   Rotator cuff arthropathy, right 02/01/2023   Rotator cuff tendinitis, right 02/01/2023   Traumatic complete tear of right rotator cuff 02/01/2023   Proteinuria, unspecified 10/19/2022   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   Cervical spondylosis 10/07/2021   Hypothyroidism 07/23/2021   Low vitamin B12 level 08/09/2020   Irritability 10/18/2019   Gastroesophageal reflux disease 03/13/2019   Acute on chronic anemia 07/04/2018   Leucopenia 02/05/2018   Supraspinatus tendon tear, right, initial encounter 02/16/2017   Infraspinatus tendon tear, right, initial encounter 02/16/2017   Biceps tendon rupture, right, initial encounter 02/16/2017   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   Hypotension 08/15/2016   Mixed hyperlipidemia 08/15/2016   Dizziness 12/30/2015   BPH (benign prostatic hyperplasia) 03/03/2015   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   Primary osteoarthritis of left knee 03/03/2015   Anticoagulation management encounter 03/03/2015   H/O neoplasm 01/12/2015   History of nonmelanoma skin cancer  01/12/2015   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   Symptomatic anemia 07/17/2014   Hepatosplenomegaly 07/17/2014   Neutropenia (HCC) 07/17/2014   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   right forearm, [facility]   Stroke (HCC) 11/2007   R brain subcortical infarct     Past Surgical History:  Procedure Laterality Date   ASPIRATION / INJECTION RENAL CYST  07/08/2015   BACK SURGERY     approx 20- 25 years ago   COLONOSCOPY     EYE SURGERY     cataract both eyes   GAS INSERTION  08/11/2011   Procedure: INSERTION OF GAS;  Surgeon: Rexene Catching, MD;  Location: [facility] OR;  Service: Ophthalmology;  Laterality: Right;  [id number]   IR BONE MARROW BIOPSY & ASPIRATION  08/03/2023   IR THORACENTESIS ASP PLEURAL SPACE W/IMG GUIDE  09/22/2023   IR THORACENTESIS ASP PLEURAL SPACE W/IMG GUIDE  10/02/2023   LEFT HEART CATH AND CORONARY ANGIOGRAPHY N/A 05/02/2023   Procedure: LEFT  HEART CATH AND CORONARY ANGIOGRAPHY;  Surgeon: Sammy Crisp, MD;  Location: [facility] INVASIVE CV LAB;  Service: Cardiovascular;  Laterality: N/A;   REVERSE SHOULDER ARTHROPLASTY Right 04/13/2023   Procedure: REVERSE SHOULDER ARTHROPLASTY;  Surgeon: Elner Hahn, MD;  Location: [facility] ORS;  Service: Orthopedics;  Laterality: Right;   SCLERAL BUCKLE  08/11/2011    Procedure: SCLERAL BUCKLE;  Surgeon: Rexene Catching, MD;  Location: [facility] OR;  Service: Ophthalmology;  Laterality: Right;   VARICOSE VEIN SURGERY      Social History   Socioeconomic History   Marital status: Married    Spouse name: [name]    Number of children: 2   Years of education: 12+   Highest education level: Some college, no degree  Occupational History   Occupation: Fish farm manager: OTHER    Comment: community   Occupation: SELF EMPLOYED    Employer: SELF EMPLOYED  Tobacco Use   Smoking status: Former    Current packs/day: 0.00    Types: Cigarettes    Quit date: 06/06/2010    Years since quitting: 13.3   Smokeless tobacco: Never  Vaping Use   Vaping status: Never Used  Substance and Sexual Activity   Alcohol use: No    Alcohol/week: 0.0 standard drinks of alcohol   Drug use: No   Sexual activity: Not Currently  Other Topics Concern   Not on file  Social History Narrative   Pt lives at home with his family.   Caffeine Use- 2 cups daily   Patient has 2 children.    Patient has some college.    Patient is right handed.          Works full time   Social Drivers of Corporate investment banker Strain: Low Risk  (07/12/2021)   Overall Financial Resource Strain (CARDIA)    Difficulty of Paying Living Expenses: Not hard at all  Food Insecurity: No Food Insecurity (10/16/2023)   Hunger Vital Sign    Worried About Running Out of Food in the Last Year: Never true    Ran Out of Food in the Last Year: Never true  Transportation Needs: No Transportation Needs (10/16/2023)   PRAPARE - Administrator, Civil Service (Medical): No    Lack of Transportation (Non-Medical): No  Physical Activity: Inactive (07/12/2021)   Exercise Vital Sign    Days of Exercise per Week: 0 days    Minutes of Exercise per Session: 0 min  Stress: No Stress Concern Present (07/12/2021)   [employer] of Occupational Health - Occupational Stress Questionnaire    Feeling of Stress :  Not at all  Social Connections: Socially Integrated (10/16/2023)   Social Connection and Isolation Panel [NHANES]    Frequency of Communication with Friends and Family: More than three times a week    Frequency of Social Gatherings with Friends and Family: More than three times a week    Attends Religious Services: More than 4 times per year    Active Member of [employer] or Organizations: Yes    Attends Banker Meetings: More than 4 times per year    Marital Status: Married  Catering manager Violence: Not At Risk (10/16/2023)   Humiliation, Afraid, Rape, and Kick questionnaire    Fear of Current or Ex-Partner: No    Emotionally Abused: No    Physically Abused: No    Sexually Abused: No     Family History  Problem Relation Age of Onset  Heart disease Father    Dementia Sister    Diabetes Son    Kidney disease Neg Hx    Prostate cancer Neg Hx      Current Facility-Administered Medications:    acetaminophen  (TYLENOL ) tablet 650 mg, 650 mg, Oral, Q6H PRN **OR** acetaminophen  (TYLENOL ) suppository 650 mg, 650 mg, Rectal, Q6H PRN, Cox, Amy N, DO   amiodarone  (PACERONE ) tablet 200 mg, 200 mg, Oral, Daily, Alexander, Natalie, DO, 200 mg at 10/20/23 1004   ceFEPIme  (MAXIPIME ) 1 g in sodium chloride  0.9 % 100 mL IVPB, 1 g, Intravenous, Q12H, Melodi Sprung, DO, Stopped at 10/20/23 1039   cyanocobalamin  (VITAMIN B12) tablet 1,000 mcg, 1,000 mcg, Oral, Daily, Alexander, Natalie, DO, 1,000 mcg at 10/20/23 1004   diphenoxylate -atropine  (LOMOTIL ) 2.5-0.025 MG per tablet 1 tablet, 1 tablet, Oral, QID PRN, Melodi Sprung, DO, 1 tablet at 10/20/23 1248   DULoxetine  (CYMBALTA ) DR capsule 20 mg, 20 mg, Oral, QHS, Alexander, Natalie, DO, 20 mg at 10/19/23 2214   furosemide  (LASIX ) injection 40 mg, 40 mg, Intravenous, Daily, Garrison Kanner, MD, 40 mg at 10/20/23 1004   guaiFENesin -dextromethorphan  (ROBITUSSIN DM) 100-10 MG/5ML syrup 5 mL, 5 mL, Oral, Q4H PRN, Melodi Sprung, DO, 5 mL at  10/20/23 1248   ipratropium-albuterol  (DUONEB) 0.5-2.5 (3) MG/3ML nebulizer solution 3 mL, 3 mL, Nebulization, Q6H PRN, Alexander, Natalie, DO   levothyroxine  (SYNTHROID ) tablet 100 mcg, 100 mcg, Oral, Q0600, Cox, Amy N, DO, 100 mcg at 10/20/23 1610   metoprolol  tartrate (LOPRESSOR ) tablet 25 mg, 25 mg, Oral, BID, Alexander, Natalie, DO, 25 mg at 10/20/23 1004   ondansetron  (ZOFRAN ) tablet 4 mg, 4 mg, Oral, Q6H PRN **OR** ondansetron  (ZOFRAN ) injection 4 mg, 4 mg, Intravenous, Q6H PRN, Cox, Amy N, DO, 4 mg at 10/17/23 0124   pantoprazole  (PROTONIX ) EC tablet 40 mg, 40 mg, Oral, Daily, Alexander, Natalie, DO, 40 mg at 10/20/23 1004   QUEtiapine  (SEROQUEL ) tablet 25 mg, 25 mg, Oral, QHS PRN, Alexander, Natalie, DO, 25 mg at 10/19/23 2225   senna-docusate (Senokot-S) tablet 1 tablet, 1 tablet, Oral, QHS PRN, Cox, Amy N, DO   simvastatin  (ZOCOR ) tablet 40 mg, 40 mg, Oral, QHS, Alexander, Natalie, DO, 40 mg at 10/19/23 2214   traMADol  (ULTRAM ) tablet 50 mg, 50 mg, Oral, Q12H PRN, Melodi Sprung, DO  Facility-Administered Medications Ordered in Other Encounters:    epoetin  alfa-epbx (RETACRIT ) injection 10,000 Units, 10,000 Units, Subcutaneous, Once, Avonne Boettcher, MD   epoetin  alfa-epbx (RETACRIT ) injection 40,000 Units, 40,000 Units, Subcutaneous, Once, Avonne Boettcher, MD   Physical exam:  Vitals:   10/20/23 0349 10/20/23 0859 10/20/23 1105 10/20/23 1552  BP: 95/61 130/79 (!) 101/59 128/73  Pulse: 84 95 90 87  Resp: 16 18 18 19   Temp: (!) 97.5 F (36.4 C) 98.1 F (36.7 C) 98.4 F (36.9 C) 98 F (36.7 C)  TempSrc:    Oral  SpO2: 96% 97% 97% 95%   Physical Exam Constitutional:      Comments: Appears frail and fatigued  Cardiovascular:     Rate and Rhythm: Normal rate and regular rhythm.     Heart sounds: Normal heart sounds.  Pulmonary:     Comments: Breath sounds decreased over bases Abdominal:     General: Bowel sounds are normal.     Palpations: Abdomen is soft.  Skin:     General: Skin is warm and dry.  Neurological:     Mental Status: He is alert and oriented to person, place, and time.  Latest Ref Rng & Units 10/20/2023    4:47 AM  CMP  Glucose 70 - 99 mg/dL 96   BUN 8 - 23 mg/dL 56   Creatinine 9.60 - 1.24 mg/dL 4.54   Sodium 098 - 119 mmol/L 137   Potassium 3.5 - 5.1 mmol/L 3.7   Chloride 98 - 111 mmol/L 105   CO2 22 - 32 mmol/L 22   Calcium  8.9 - 10.3 mg/dL 7.6   Total Protein 6.5 - 8.1 g/dL 4.7   Total Bilirubin 0.0 - 1.2 mg/dL 1.2   Alkaline Phos 38 - 126 U/L 60   AST 15 - 41 U/L 13   ALT 0 - 44 U/L 16       Latest Ref Rng & Units 10/20/2023    4:47 AM  CBC  WBC 4.0 - 10.5 K/uL 3.0   Hemoglobin 13.0 - 17.0 g/dL 8.2   Hematocrit 14.7 - 52.0 % 25.0   Platelets 150 - 400 K/uL 78     @IMAGES @  DG Chest Portable 1 View Result Date: 10/16/2023 CLINICAL DATA:  Shortness of breath EXAM: PORTABLE CHEST 1 VIEW COMPARISON:  Oct 06, 2023 FINDINGS: Worsening right lung infiltrates. Right lower lobe consolidation. Right small pleural effusion Prominence interstitial markings left lung. Findings could correlate with congestive changes superimposed right lower lobe pneumonia and right pleural effusion Total right shoulder arthroplasty IMPRESSION: Worsening right lung infiltrates. Right lower lobe consolidation. Right small pleural effusion. Findings could correlate with congestive changes superimposed right lower lobe pneumonia and right pleural effusion. Electronically Signed   By: Fredrich Jefferson M.D.   On: 10/16/2023 11:38   VAS US  LOWER EXTREMITY VENOUS (DVT) Result Date: 10/07/2023  Lower Venous DVT Study Patient Name:  [name]  Date of Exam:   10/06/2023 Medical Rec #: [id number]          Accession #:    [id number] Date of Birth: 12/08/1940          Patient Gender: M Patient Age:   88 years Exam Location:  [facility] Procedure:      VAS US  LOWER EXTREMITY VENOUS (DVT) Referring Phys: Cherri Corns  --------------------------------------------------------------------------------  Indications: Edema. Other Indications: Rehab. Comparison Study: Previous exam on 05/03/2023 was negaitve for DVT Performing Technologist: Arlyce Berger RVT, RDMS  Examination Guidelines: A complete evaluation includes B-mode imaging, spectral Doppler, color Doppler, and power Doppler as needed of all accessible portions of each vessel. Bilateral testing is considered an integral part of a complete examination. Limited examinations for reoccurring indications may be performed as noted. The reflux portion of the exam is performed with the patient in reverse Trendelenburg.  +---------+---------------+---------+-----------+----------+--------------+ RIGHT    CompressibilityPhasicitySpontaneityPropertiesThrombus Aging +---------+---------------+---------+-----------+----------+--------------+ CFV      Full           Yes      Yes                                 +---------+---------------+---------+-----------+----------+--------------+ SFJ      Full                                                        +---------+---------------+---------+-----------+----------+--------------+ FV Prox  Full           Yes  Yes                                 +---------+---------------+---------+-----------+----------+--------------+ FV Mid   Full           Yes      Yes                                 +---------+---------------+---------+-----------+----------+--------------+ FV DistalFull           Yes      Yes                                 +---------+---------------+---------+-----------+----------+--------------+ PFV      Full                                                        +---------+---------------+---------+-----------+----------+--------------+ POP      Full           Yes      Yes                                 +---------+---------------+---------+-----------+----------+--------------+  PTV      Full                                                        +---------+---------------+---------+-----------+----------+--------------+ PERO     Full                                                        +---------+---------------+---------+-----------+----------+--------------+   +---------+---------------+---------+-----------+----------+--------------+ LEFT     CompressibilityPhasicitySpontaneityPropertiesThrombus Aging +---------+---------------+---------+-----------+----------+--------------+ CFV      Full           Yes      Yes                                 +---------+---------------+---------+-----------+----------+--------------+ SFJ      Full                                                        +---------+---------------+---------+-----------+----------+--------------+ FV Prox  Full           Yes      Yes                                 +---------+---------------+---------+-----------+----------+--------------+ FV Mid   Full           Yes      Yes                                 +---------+---------------+---------+-----------+----------+--------------+  FV DistalFull           Yes      Yes                                 +---------+---------------+---------+-----------+----------+--------------+ PFV      Full                                                        +---------+---------------+---------+-----------+----------+--------------+ POP      Full           Yes      Yes                                 +---------+---------------+---------+-----------+----------+--------------+ PTV      Full                                                        +---------+---------------+---------+-----------+----------+--------------+ PERO     Full                                                        +---------+---------------+---------+-----------+----------+--------------+     Summary: BILATERAL: - No evidence of deep  vein thrombosis seen in the lower extremities, bilaterally. -No evidence of popliteal cyst, bilaterally. -Subcutaneous edema, bilaterally.  *See table(s) above for measurements and observations. Electronically signed by Delaney Fearing on 10/07/2023 at 11:38:15 AM.    Final    DG Chest 2 View Result Date: 10/06/2023 CLINICAL DATA:  142230 Pleural effusion 142230 EXAM: CHEST - 2 VIEW COMPARISON:  October 03, 2023 FINDINGS: Small right pleural effusion with right basilar airspace disease, unchanged. Unchanged cardiomegaly. Tortuous aorta with aortic atherosclerosis. No acute fracture or destructive lesions. Multilevel thoracic osteophytosis. Partially visualized right shoulder arthroplasty. IMPRESSION: Unchanged small right pleural effusion with right basilar atelectasis. Electronically Signed   By: Rance Burrows M.D.   On: 10/06/2023 12:24   DG CHEST PORT 1 VIEW Result Date: 10/03/2023 CLINICAL DATA:  142230 Pleural effusion 142230 EXAM: PORTABLE CHEST - 1 VIEW COMPARISON:  October 02, 2023 FINDINGS: Small right pleural effusion with right basilar atelectasis. Moderate cardiomegaly. Tortuous aorta with aortic atherosclerosis. No acute fracture or destructive lesions. Multilevel thoracic osteophytosis. Right glenohumeral joint arthroplasty. IMPRESSION: 1. Worsening, small right pleural effusion with right basilar atelectasis. 2. The nodular opacity in the right midlung on the prior radiograph is not well visualized. Electronically Signed   By: Rance Burrows M.D.   On: 10/03/2023 14:39   DG Chest 1 View Result Date: 10/02/2023 CLINICAL DATA:  Status post thoracentesis EXAM: CHEST  1 VIEW COMPARISON:  10/02/2023 FINDINGS: Normal mediastinum and cardiac silhouette. Normal pulmonary vasculature. No evidence of effusion, infiltrat no evidence of pneumothorax following RIGHT thoracentesis. RIGHT mid chest opacity again noted. There is volume loss in the RIGHT hemithorax similar to prior. IMPRESSION: No pneumothorax  following thoracentesis. Electronically Signed  By: Deboraha Fallow M.D.   On: 10/02/2023 15:59   IR THORACENTESIS ASP PLEURAL SPACE W/IMG GUIDE Result Date: 10/02/2023 INDICATION: 83 year old male with recurrent right pleural effusion. Request for therapeutic thoracentesis. EXAM: ULTRASOUND GUIDED THERAPEUTIC THORACENTESIS MEDICATIONS: 6 cc of 1% lidocaine  COMPLICATIONS: None immediate. PROCEDURE: An ultrasound guided thoracentesis was thoroughly discussed with the patient and questions answered. The benefits, risks, alternatives and complications were also discussed. The patient understands and wishes to proceed with the procedure. Written consent was obtained. Ultrasound was performed to localize and mark an adequate pocket of fluid in the right chest. The area was then prepped and draped in the normal sterile fashion. 1% Lidocaine  was used for local anesthesia. Under ultrasound guidance a 6 Fr Safe-T-Centesis catheter was introduced. Thoracentesis was performed. The catheter was removed and a dressing applied. FINDINGS: A total of approximately 825 cc of clear, dark amber pleural fluid was removed. IMPRESSION: Successful ultrasound guided right thoracentesis yielding 825 cc of pleural fluid. Procedure performed by Lambert Pillion, PA-C Electronically Signed   By: Nicoletta Barrier M.D.   On: 10/02/2023 15:39   DG Chest 2 View Result Date: 10/02/2023 CLINICAL DATA:  Cough. EXAM: CHEST - 2 VIEW COMPARISON:  Chest radiograph dated 09/22/2023. FINDINGS: Right pleural effusion with partial compressive atelectasis of the right lung, progressed since the prior radiograph. And LL airspace density in the right mid lung field may represent atelectasis or infiltrate. The left lung is clear. No pneumothorax. Stable cardiomegaly. Degenerative changes of the spine. No acute osseous pathology. IMPRESSION: 1. Right pleural effusion with partial compressive atelectasis of the right lung. 2. Right mid lung field atelectasis versus  infiltrate. Electronically Signed   By: Angus Bark M.D.   On: 10/02/2023 13:26   DG Abd Portable 1V Result Date: 09/27/2023 CLINICAL DATA:  Nausea, vomiting. EXAM: PORTABLE ABDOMEN - 1 VIEW COMPARISON:  July 02, 2009. FINDINGS: Moderate gastric distention is noted. No significant dilatation of large or small bowel is noted. Mild amount of stool seen throughout the colon. IMPRESSION: Mild stool burden. Moderate gastric distention is noted of uncertain etiology. Electronically Signed   By: Rosalene Colon M.D.   On: 09/27/2023 12:14   US  RENAL Result Date: 09/26/2023 CLINICAL DATA:  [id number] AKI (acute kidney injury) (HCC) [id number] EXAM: RENAL / URINARY TRACT ULTRASOUND COMPLETE COMPARISON:  September 21, 2023 FINDINGS: Right Kidney: Renal measurements: 11.6 x 5.9 x 6.3 cm = volume: 224 mL. Multiple cysts, the largest measuring 7.3 x 6.3 x 7.1 cm. Increased cortical echogenicity. No hydronephrosis or nephrolithiasis. Left Kidney: Renal measurements: 12.4 x 5.1 x 5.4 cm = volume: 149 mL. Increased echogenicity No hydronephrosis or nephrolithiasis. Multiple cysts, the largest measures 5.5 x 5.6 x 6.4 cm. Bladder: Appears normal for degree of bladder distention. Other: Lobular hypoechogenicity in the gallbladder measuring 2.9 x 1.4 x 2.9 cm. 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 IMPRESSION: No hydronephrosis or nephrolithiasis. 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 Electronically Signed   By: Rance Burrows M.D.   On: 09/26/2023 08:20   MR CERVICAL SPINE WO CONTRAST Result Date: 09/25/2023 CLINICAL DATA:  acute neck pain, fall. EXAM: MRI CERVICAL SPINE WITHOUT CONTRAST TECHNIQUE: Multiplanar, multisequence MR imaging of the cervical spine was performed. No intravenous contrast was administered. COMPARISON:  None Available. FINDINGS: Alignment: Approximately 4 mm of anterolisthesis of C5 on C6, similar to recent CT. No other sagittal subluxation. Vertebrae: Please  see recent CT of the cervical spine for characterization of  bony detail. There is heterogeneous marrow without visible marrow edema. No suspicious bone lesions. Cord: Normal cord signal. Posterior Fossa, vertebral arteries, paraspinal tissues: Small volume of prevertebral edema, greatest in the upper cervical spine and extending inferiorly to the cervicothoracic junction. Uplifting of the anterior longitudinal ligament with suspected disruption at C2 (for example see series 2, image 10). Incomplete STIR suppression in areas limits assessment. Disc levels: Severe craniocervical degenerative change. C2-C3: Facet arthropathy. No significant canal or foraminal stenosis. C3-C4: Posterior disc osteophyte complex. Bilateral facet and uncovertebral hypertrophy. Resulting severe right and moderate to severe left foraminal stenosis. Mild canal stenosis. C4-C5: Left greater than right facet and uncovertebral hypertrophy. Resulting moderate to severe left foraminal stenosis and mild right foraminal stenosis. Patent canal. C5-C6: 4 mm of anterolisthesis of C4 on C5. Posterior disc osteophyte complex with bilateral facet and uncovertebral hypertrophy. Resulting moderate canal stenosis and severe bilateral foraminal stenosis. C6-C7: Left greater than right facet and uncovertebral hypertrophy. Resulting severe left and moderate right foraminal stenosis. Patent canal. C7-T1: Left greater than right facet and uncovertebral hypertrophy. Resulting mild-to-moderate bilateral foraminal stenosis. Patent canal. IMPRESSION: 1. 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 Adjacent small volume of prevertebral edema which extends inferiorly to the cervicothoracic junction. 2. At C5-C6, moderate canal stenosis and severe bilateral foraminal stenosis. 3. At C6-C7, severe left and moderate right foraminal stenosis. 4. At C3-C4, severe right and moderate to severe left foraminal stenosis. 5. At C4-C5, moderate to severe left foraminal  stenosis. These results will be called to the ordering clinician or representative by the Radiologist Assistant, and communication documented in the PACS or [employer]. Electronically Signed   By: Stevenson Elbe M.D.   On: 09/25/2023 00:52   CT HEAD WO CONTRAST ( ) Result Date: 09/24/2023 CLINICAL DATA:  Follow-up subarachnoid hemorrhage. EXAM: CT HEAD WITHOUT CONTRAST TECHNIQUE: Contiguous axial images were obtained from the base of the skull through the vertex without intravenous contrast. RADIATION DOSE REDUCTION: This exam was performed according to the departmental dose-optimization program which includes automated exposure control, adjustment of the mA and/or kV according to patient size and/or use of iterative reconstruction technique. COMPARISON:  CT head 09/21/2023 FINDINGS: Brain: Focal subarachnoid hemorrhage involving the sulci within the left parietal lobe are redemonstrated and appear slightly less conspicuous compared to 09/21/2023. No significant mass effect. No midline shift. Stable ventricular caliber. No new hemorrhage is noted. No evidence of acute infarct. Generalized cerebral atrophy. Vascular: No hyperdense vessel or unexpected calcification. Skull: No acute fracture. Decreased size of the left frontal scalp hematoma. Sinuses/Orbits: No acute abnormality. Other: None. IMPRESSION: Slightly decreased subarachnoid hemorrhage in the left parietal lobe compared to 09/21/2023. No new hemorrhage. Electronically Signed   By: Rozell Cornet M.D.   On: 09/24/2023 22:33   MR SHOULDER LEFT WO CONTRAST Result Date: 09/22/2023 CLINICAL DATA:  Fall. EXAM: MRI OF THE LEFT SHOULDER WITHOUT CONTRAST TECHNIQUE: Multiplanar, multisequence MR imaging of the shoulder was performed. No intravenous contrast was administered. COMPARISON:  None Available. FINDINGS: Rotator cuff: Supraspinatus and infraspinatus tendinosis with partial-thickness articular sided insertional tearing at the junctional  fibers of the posterior supraspinatus and anterior infraspinatus tendons. Teres minor tendon is intact. Subscapularis tendon is intact. Muscles: Partial-thickness tear at the origin of the acromial head of the deltoid muscle with associated intramuscular edema. No intramuscular collection. No significant muscle atrophy. Biceps Long Head: Diminutive appearance with chronic tearing of the intra-articular biceps tendon. Acromioclavicular Joint: Moderate arthropathy of the acromioclavicular joint. Widening of the acromioclavicular  joint measuring 8-10 mm with associated joint effusion and disruption of the superior acromioclavicular ligament. No subacromial/subdeltoid bursal fluid. Glenohumeral Joint: Glenohumeral joint effusion. Diffuse thinning of the glenoid articular cartilage. Labrum: Diffuse labral degeneration. Tear of the superior labrum extending through the posteroinferior labrum. Bones: No acute fracture identified. Subcortical cystic change at the greater tuberosity. No aggressive osseous lesion. IMPRESSION: 1. Widening of the acromioclavicular joint measuring 8-10 mm with disruption of the superior acromioclavicular ligament and associated joint effusion. These findings are compatible with [place] type II injury. 2. Partial-thickness tear at the origin of the acromial head of the deltoid muscle with associated grade 2 strain. 3. Diffuse labral degeneration with tear of the superior labrum extending through the posteroinferior labrum. 4. Supraspinatus and infraspinatus tendinosis with partial-thickness articular sided insertional tearing at the junctional fibers of the posterior supraspinatus and anterior infraspinatus tendons. 5. Glenohumeral joint effusion. Electronically Signed   By: Mannie Seek M.D.   On: 09/22/2023 16:57   IR THORACENTESIS ASP PLEURAL SPACE W/IMG GUIDE Result Date: 09/22/2023 INDICATION: 83 year old male presented to ED after a fall, previous CT showed right pleural effusion.  Request for therapeutic and diagnostic thoracentesis. EXAM: ULTRASOUND GUIDED RIGHT THORACENTESIS MEDICATIONS: 10 mL 1% lidocaine  COMPLICATIONS: None immediate. PROCEDURE: An ultrasound guided thoracentesis was thoroughly discussed with the patient and his wife and questions answered. Wife states that patient had thoracentesis at [facility] before. The benefits, risks, alternatives and complications were also discussed. The patient and his wife understands and wishes to proceed with the procedure. Written consent was obtained. Ultrasound was performed to localize and mark an adequate pocket of fluid in the right chest. The area was then prepped and draped in the normal sterile fashion. 1% Lidocaine  was used for local anesthesia. Under ultrasound guidance a 6 Fr Safe-T-Centesis catheter was introduced. Thoracentesis was performed. The catheter was removed and a dressing applied. FINDINGS: A total of approximately 1650 mL of hazy amber fluid was removed. Samples were sent to the laboratory as requested by the clinical team. Post procedure chest X-ray reviewed, negative for pneumothorax. IMPRESSION: Successful ultrasound guided right thoracentesis yielding 1650 mL of pleural fluid. Performed by: Aimee Han, PA-C Electronically Signed   By: Nicoletta Barrier M.D.   On: 09/22/2023 16:05   DG Chest 1 View Result Date: 09/22/2023 CLINICAL DATA:  [id number] Status post thoracentesis [id number]. EXAM: CHEST  1 VIEW COMPARISON:  09/21/2023. FINDINGS: There is small right pleural effusion with associated compressive atelectatic changes in the right lung. There is significant interval decrease in the amount of effusion, since the prior study, status post thoracentesis. No pneumothorax. Bilateral lung fields are otherwise clear. Left lateral costophrenic angle is clear. Stable cardio-mediastinal silhouette. No acute osseous abnormalities. Right reverse shoulder arthroplasty noted. The soft tissues are within normal limits. IMPRESSION: *Significant  interval decrease in the amount of right pleural effusion, status post thoracentesis. No pneumothorax. Electronically Signed   By: Beula Brunswick M.D.   On: 09/22/2023 15:44   CT CHEST ABDOMEN PELVIS W CONTRAST Result Date: 09/21/2023 CLINICAL DATA:  Trauma EXAM: CT CHEST, ABDOMEN, AND PELVIS WITH CONTRAST TECHNIQUE: Multidetector CT imaging of the chest, abdomen and pelvis was performed following the standard protocol during bolus administration of intravenous contrast. RADIATION DOSE REDUCTION: This exam was performed according to the departmental dose-optimization program which includes automated exposure control, adjustment of the mA and/or kV according to patient size and/or use of iterative reconstruction technique. CONTRAST:  75mL OMNIPAQUE  IOHEXOL  350 MG/ML SOLN COMPARISON:  CT abdomen and pelvis  05/02/2023. MRI abdomen 02/02/2023. FINDINGS: CT CHEST FINDINGS Cardiovascular: Ascending aortic aneurysm measures 4 cm. The heart is moderately enlarged. There is a trace pericardial effusion. There are atherosclerotic calcifications of the aorta. Mediastinum/Nodes: There is a peripherally calcified right thyroid  nodule measuring 1 cm. There are no enlarged mediastinal or hilar lymph nodes. The visualized esophagus is within normal limits. Lungs/Pleura: There is a large right pleural effusion which has increased from prior. There is compressive atelectasis of the right middle lobe and right lower lobe. There is minimal dependent atelectasis in the left lower lobe. The left lung is otherwise clear. Trachea and central airways are patent. Musculoskeletal: Right shoulder arthroplasty is present. No acute fractures are seen. CT ABDOMEN PELVIS FINDINGS Hepatobiliary: Heterogeneous enhancing lesion in the liver measures 2.9 x 2.4 cm and is not well seen on delayed imaging favored as hemangioma, confirmed by prior MRI. No subcapsular fluid collection. Gallbladder and bile ducts are within normal limits. Pancreas:  Unremarkable. No pancreatic ductal dilatation or surrounding inflammatory changes. Spleen: The spleen is mild-to-moderately enlarged, unchanged. Adrenals/Urinary Tract: Bilateral renal cysts are unchanged. The largest simple cyst in the right kidney measures 7.4 cm. The largest simple cyst in the left kidney measures 6.3 cm. Within the inferior pole the right kidney there is an exophytic cystic area with peripheral calcifications measuring 7.9 x 12.5 cm, similar to prior. There are numerable additional hypodensities which are too small to characterize in both kidneys, favored as cysts. There is no hydronephrosis or perinephric fluid collection. The adrenal glands and bladder are within normal limits. Stomach/Bowel: Stomach is within normal limits. Appendix appears normal. No evidence of bowel wall thickening, distention, or inflammatory changes. There is diffuse colonic diverticulosis. 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 Aorta and IVC are normal in size there are atherosclerotic calcifications of the aorta and coronary arteries. No enlarged lymph nodes are identified. Reproductive: Prostate is unremarkable. Other: No abdominal wall hernia or abnormality. No abdominopelvic ascites. Musculoskeletal: Multilevel degenerative changes affect the spine. No acute fractures are identified. IMPRESSION: 1. No acute posttraumatic sequelae in the chest, abdomen or pelvis. 2. Large right pleural effusion has increased from prior with compressive atelectasis of the right middle lobe and right lower lobe. 3. Stable cardiomegaly. 4. 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 Recommend annual imaging followup by CTA or MRA. This recommendation follows 2010 ACCF/AHA/AATS/ACR/ASA/SCA/SCAI/SIR/STS/SVM Guidelines for the Diagnosis and Management of Patients with Thoracic Aortic Disease. Circulation. 2010; 121: A213-Y865. Aortic aneurysm NOS (ICD10-I71.9) 5. Stable splenomegaly and splenic varices. 6. 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 7. Stable bilateral renal cysts. Stable 1 cm peripherally calcified cyst in the inferior pole of the right kidney. 8. Colonic diverticulosis. Aortic Atherosclerosis (ICD10-I70.0). Electronically Signed   By: Tyron Gallon M.D.   On: 09/21/2023 17:26   DG Pelvis Portable Result Date: 09/21/2023 CLINICAL DATA:  Status post fall on blood thinners with altered mental status EXAM: PORTABLE PELVIS 2 VIEWS COMPARISON:  CT abdomen and pelvis dated 05/02/2023 FINDINGS: There is no evidence of acute displaced pelvic fracture or diastasis. No pelvic bone lesions are seen. IMPRESSION: No acute displaced pelvic fracture or diastasis. Electronically Signed   By: Limin  Xu M.D.   On: 09/21/2023 16:38   DG Chest Port 1 View Result Date: 09/21/2023 CLINICAL DATA:  Trauma EXAM: PORTABLE CHEST 1 VIEW COMPARISON:  Chest x-ray 05/04/2023 FINDINGS: There is a moderate-sized right pleural effusion. The heart is enlarged there central pulmonary vascular congestion. The cardiac silhouette is enlarged, unchanged. Right  shoulder arthroplasty is present. No acute fractures are seen. IMPRESSION: 1. Moderate-sized right pleural effusion. 2. Cardiomegaly with central pulmonary vascular congestion. Electronically Signed   By: Tyron Gallon M.D.   On: 09/21/2023 16:38   CT HEAD WO CONTRAST Addendum Date: 09/21/2023 ADDENDUM REPORT: 09/21/2023 16:05 ADDENDUM: These results were called by telephone at the time of interpretation on 09/21/2023 at 4:02 pm to provider TRAVIS YOUNG , who verbally acknowledged these results. Electronically Signed   By: Denny Flack M.D.   On: 09/21/2023 16:05   Result Date: 09/21/2023 CLINICAL DATA:  Blunt trauma, fall. EXAM: CT HEAD WITHOUT CONTRAST TECHNIQUE: Contiguous axial images were obtained from the base of the skull through the vertex without intravenous contrast. RADIATION DOSE REDUCTION: This exam was performed according to the departmental dose-optimization program which  includes automated exposure control, adjustment of the mA and/or kV according to patient size and/or use of iterative reconstruction technique. COMPARISON:  CT head 04/17/2023 FINDINGS: Brain: Focal subarachnoid hemorrhage involving sulci within the left parietal lobe. No additional areas of intracranial hemorrhage noted. No edema or mass effect. No midline shift. No CT evidence of acute infarct. Nonspecific hypoattenuation in the periventricular and subcortical white matter favored to reflect chronic microvascular ischemic changes. The basilar cisterns are patent. Ventricles: Prominence of the ventricles suggesting underlying parenchymal volume loss. Vascular: Atherosclerotic calcifications of the carotid siphons and intracranial vertebral arteries. No hyperdense vessel. Skull: No acute or aggressive finding. Orbits: Bilateral lens replacement. Scleral buckle on the right. Orbits otherwise unremarkable. Sinuses: Mild mucosal thickening in the right maxillary sinus. Other: Mastoid air cells are clear. Left frontal scalp hematoma. Additional soft tissue swelling in the left parietal scalp. Traumatic Brain Injury Risk Stratification Skull Fracture: No - Low/mBIG 1 Subdural Hematoma (SDH): No - Low Subarachnoid Hemorrhage [facility]): trace (up to 2 sulci) - Low/mBIG 1 Epidural Hematoma (EDH): No - Low/mBIG 1 Cerebral contusion, intra-axial, intraparenchymal Hemorrhage (IPH): No Intraventricular Hemorrhage (IVH): No - Low/mBIG 1 Midline Shift > 1mm or Edema/effacement of sulci/vents: No - Low/mBIG 1 ---------------------------------------------------- IMPRESSION: Focal subarachnoid hemorrhage involving a single sulcus of the left parietal lobe. No edema, mass effect, or midline shift. Left frontoparietal scalp hematomas. Chronic microvascular ischemic changes and mild to moderate parenchymal volume loss. Electronically Signed: By: Denny Flack M.D. On: 09/21/2023 15:58   CT CERVICAL SPINE WO CONTRAST Result Date:  09/21/2023 CLINICAL DATA:  Poly trauma, blunt.  Status post fall. EXAM: CT CERVICAL SPINE WITHOUT CONTRAST TECHNIQUE: Multidetector CT imaging of the cervical spine was performed without intravenous contrast. Multiplanar CT image reconstructions were also generated. RADIATION DOSE REDUCTION: This exam was performed according to the departmental dose-optimization program which includes automated exposure control, adjustment of the mA and/or kV according to patient size and/or use of iterative reconstruction technique. COMPARISON:  Cervical spine radiographs 09/18/2012. MRI cervical spine 01/14/2011. Head CT and neck CTA 04/12/2022. FINDINGS: Alignment: Straightening of the usual cervical lordosis with a mild convex right scoliosis. 4 mm of anterolisthesis at C5-6, increased from previous cervical MRI. Skull base and vertebrae: There is diffuse spondylosis with severe disc space narrowing and facet arthrosis throughout the cervical spine. There is interfacetal ankylosis bilaterally at C2-3 and on the left at C4-5. Possible interbody ankylosis at those levels as well. Asymmetric arthrosis involving the right lateral atlantoaxial joint. Suggested nondisplaced fracture of the posterior arch of C1 on the right (best seen on axial images 24 and 25 of series 4) is grossly unchanged from previous head CT (image 1/5). No other  evidence of acute cervical spine fracture or traumatic subluxation. Soft tissues and spinal canal: No prevertebral fluid or swelling. No visible canal hematoma. Disc levels: As alluded to above, there is severe multilevel spondylosis with disc space narrowing, uncinate spurring and facet arthrosis. Findings have progressed from previous MRI and contribute to spinal stenosis and foraminal narrowing at multiple levels. Upper chest: Chest findings dictated separately. Other: Bilateral carotid atherosclerosis. IMPRESSION: 1. No evidence of acute cervical spine fracture or traumatic subluxation. 2. Severe  multilevel cervical spondylosis, progressed from previous MRI from 2012. 3. No acute soft tissue abnormalities identified. Electronically Signed   By: Elmon Hagedorn M.D.   On: 09/21/2023 15:25    Assessment and plan- Patient is a 83 y.o. male With history of primary myelofibrosis predominantly anemia currently admitted for healthcare associated pneumonia  Patient's white cell count and platelet count is usually dropped during his hospitalizations and are now gradually improving.  His myelofibrosis has mainly affected his anemia and presently his hemoglobin is 7.8 requiring intermittent blood transfusions.  Continue to hold Revlimid  during this hospitalization.  I will follow him postdischarge.  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  Patient is also met with palliative care during this hospitalization and we will continue to address his goals as an outpatient as well.     Visit Diagnosis 1. Bacteremia due to Pseudomonas   2. Acute respiratory failure with hypoxia (HCC)   3. Acute congestive heart failure, unspecified heart failure type (HCC)   4. Pneumonia of right lower lobe due to infectious organism     Dr. Seretha Dance, MD, MPH [facility] at [facility] [phone] 10/20/2023

## 2023-10-20 NOTE — [place] FL2 (Signed)
 [place]  MEDICAID FL2 LEVEL OF CARE FORM     IDENTIFICATION  Patient Name: [name] Birthdate: 01-01-1941 Sex: male Admission Date (Current Location): 10/16/2023  [place] and [place] Number:  Chiropodist and Address:  [facility], [address] [place], [place] [address]      Provider Number: [id number]  Attending Physician Name and Address:  Melodi Sprung, DO  Relative Name and Phone Number:  Sandip, Gottschall (Spouse)  [phone] (Mobile)    Current Level of Care: Hospital Recommended Level of Care: Nursing Facility Prior Approval Number:    Date Approved/Denied:   PASRR Number: [id number]  Discharge Plan: SNF    Current Diagnoses: Patient Active Problem List   Diagnosis Date Noted   Bacteremia due to Pseudomonas 10/19/2023   Pancytopenia (HCC) 10/19/2023   Palliative care encounter 10/18/2023   Severe sepsis with acute organ dysfunction (HCC) 10/16/2023   Cough 10/16/2023   Right lower lobe pneumonia 10/16/2023   Weakness 10/16/2023   Hypomagnesemia 10/16/2023   Paroxysmal atrial fibrillation (HCC) 10/10/2023   Mild cognitive impairment 10/09/2023   Persistent atrial fibrillation (HCC) 10/06/2023   Malnutrition of moderate degree 10/03/2023   Traumatic subarachnoid hemorrhage (HCC) 09/29/2023   Subarachnoid hemorrhage following injury (HCC) 09/21/2023   CKD (chronic kidney disease) stage 4, GFR 15-29 ml/min (HCC) 06/29/2023   Pleural effusion 05/30/2023   Atrial flutter (HCC) 05/23/2023   History of CVA (cerebrovascular accident) 05/23/2023   Hypernatremia 05/23/2023   Pericardial effusion 05/23/2023   Pericarditis 05/23/2023   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   Overweight (BMI 25.0-29.9) 05/05/2023   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, 41-49%) (HCC) 05/05/2023   Acute kidney injury superimposed on chronic kidney disease (HCC) 05/04/2023   Atrial  fibrillation with rapid ventricular response (HCC) 05/04/2023   Normocytic anemia 05/03/2023   ST elevation myocardial infarction (STEMI) (HCC) 05/03/2023   Tachycardia 05/03/2023   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/2023   Acute pericarditis 05/02/2023   Altered mental status 04/21/2023   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, of the knee, left 04/21/2023   Knee effusion, left 04/21/2023   Acute renal failure with acute 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) 04/17/2023   History of melanoma 04/17/2023   S/P reverse total shoulder arthroplasty, 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 04/17/2023   Hallucinations 04/17/2023   Hyperkalemia 04/17/2023   Acute pain of left knee 04/17/2023   Delirium 04/17/2023   Acute kidney injury (HCC) 04/17/2023   Rotator cuff arthropathy, right 02/01/2023   Rotator cuff tendinitis, right 02/01/2023   Traumatic complete tear of right rotator cuff 02/01/2023   Proteinuria, unspecified 10/19/2022   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   Cervical spondylosis 10/07/2021   Hypothyroidism 07/23/2021   Low vitamin B12 level 08/09/2020   Irritability 10/18/2019   Gastroesophageal reflux disease 03/13/2019   Acute on chronic anemia 07/04/2018   Leucopenia 02/05/2018   Supraspinatus tendon tear, right, initial encounter 02/16/2017   Infraspinatus tendon tear, right, initial encounter 02/16/2017   Biceps tendon rupture, right, initial encounter 02/16/2017   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   Hypotension 08/15/2016   Mixed hyperlipidemia 08/15/2016   Dizziness 12/30/2015   BPH (benign prostatic hyperplasia) 03/03/2015   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   Primary osteoarthritis of left knee 03/03/2015   Anticoagulation management encounter 03/03/2015   H/O neoplasm 01/12/2015   History of nonmelanoma skin cancer 01/12/2015   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   Symptomatic anemia 07/17/2014  Hepatosplenomegaly  07/17/2014   Neutropenia (HCC) 07/17/2014   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    Orientation RESPIRATION BLADDER Height & Weight     Self, Time, Situation, Place  Normal   Weight:   Height:     BEHAVIORAL SYMPTOMS/MOOD NEUROLOGICAL BOWEL NUTRITION STATUS        Diet  AMBULATORY STATUS COMMUNICATION OF NEEDS Skin   Extensive Assist Verbally Normal                       Personal Care Assistance Level of Assistance  Bathing, Feeding, Dressing Bathing Assistance: Limited assistance Feeding assistance: Limited assistance Dressing Assistance: Limited assistance     Functional Limitations Info  Hearing, Sight (Pt wears glasses per bedside RN) Sight Info: Impaired (Pt wears glasses) Hearing Info: Impaired (Per bedside RN pt uses Hearing Aids)      SPECIAL CARE FACTORS FREQUENCY                       Contractures Contractures Info: Not present    Additional Factors Info  Code Status Code Status Info: Full Code             Current Medications (10/20/2023):  This is the current hospital active medication list Current Facility-Administered Medications  Medication Dose Route Frequency Provider Last Rate Last Admin   acetaminophen  (TYLENOL ) tablet 650 mg  650 mg Oral Q6H PRN Cox, Amy N, DO       Or   acetaminophen  (TYLENOL ) suppository 650 mg  650 mg Rectal Q6H PRN Cox, Amy N, DO       amiodarone  (PACERONE ) tablet 200 mg  200 mg Oral Daily Alexander, Natalie, DO   200 mg at 10/20/23 1004   ceFEPIme  (MAXIPIME ) 1 g in sodium chloride  0.9 % 100 mL IVPB  1 g Intravenous Q12H Melodi Sprung, DO   Stopped at 10/20/23 1039   cyanocobalamin  (VITAMIN B12) tablet 1,000 mcg  1,000 mcg Oral Daily Alexander, Natalie, DO   1,000 mcg at 10/20/23 1004   diphenoxylate-atropine  (LOMOTIL) 2.5-0.025 MG per tablet 1 tablet  1 tablet Oral QID PRN Alexander, Natalie,  DO   1 tablet at 10/19/23 1751   DULoxetine  (CYMBALTA ) DR capsule 20 mg  20 mg Oral QHS Alexander, Natalie, DO   20 mg at 10/19/23 2214   furosemide  (LASIX ) injection 40 mg  40 mg Intravenous Daily Garrison Kanner, MD   40 mg at 10/20/23 1004   guaiFENesin -dextromethorphan  (ROBITUSSIN DM) 100-10 MG/5ML syrup 5 mL  5 mL Oral Q4H PRN Alexander, Natalie, DO   5 mL at 10/19/23 1800   ipratropium-albuterol  (DUONEB) 0.5-2.5 (3) MG/3ML nebulizer solution 3 mL  3 mL Nebulization Q6H PRN Alexander, Natalie, DO       levothyroxine  (SYNTHROID ) tablet 100 mcg  100 mcg Oral Q0600 Cox, Amy N, DO   100 mcg at 10/20/23 4098   metoprolol  tartrate (LOPRESSOR ) tablet 25 mg  25 mg Oral BID Alexander, Natalie, DO   25 mg at 10/20/23 1004   ondansetron  (ZOFRAN ) tablet 4 mg  4 mg Oral Q6H PRN Cox, Amy N, DO       Or   ondansetron  (ZOFRAN ) injection 4 mg  4 mg Intravenous Q6H PRN Cox, Amy N, DO   4 mg at 10/17/23 0124   pantoprazole  (PROTONIX ) EC tablet 40 mg  40 mg Oral Daily Alexander, Natalie, DO  40 mg at 10/20/23 1004   QUEtiapine  (SEROQUEL ) tablet 25 mg  25 mg Oral QHS PRN Alexander, Natalie, DO   25 mg at 10/19/23 2225   senna-docusate (Senokot-S) tablet 1 tablet  1 tablet Oral QHS PRN Cox, Amy N, DO       simvastatin  (ZOCOR ) tablet 40 mg  40 mg Oral QHS Alexander, Natalie, DO   40 mg at 10/19/23 2214   traMADol  (ULTRAM ) tablet 50 mg  50 mg Oral Q12H PRN Alexander, Natalie, DO       Facility-Administered Medications Ordered in Other Encounters  Medication Dose Route Frequency Provider Last Rate Last Admin   epoetin  alfa-epbx (RETACRIT ) injection 10,000 Units  10,000 Units Subcutaneous Once Avonne Boettcher, MD       epoetin  alfa-epbx (RETACRIT ) injection 40,000 Units  40,000 Units Subcutaneous Once Rao, Archana C, MD         Discharge Medications: Please see discharge summary for a list of discharge medications.  Relevant Imaging Results:  Relevant Lab Results:   Additional Information [id number]  Zoe Hinds, RN

## 2023-10-20 NOTE — Progress Notes (Addendum)
 PROGRESS NOTE    [name]   UUV:[id number] DOB: Mar 16, 1941  DOA: 10/16/2023 Date of Service: 10/20/23 which is hospital day 4  PCP: [name], [facility] course / significant events:   HPI: [name] is a 83 year old male with history of traumatic subarachnoid hemorrhage, myelofibrosis, symptomatic anemia, persistent atrial fibrillation, mild cognitive impairment, hypothyroid, GERD, BPH, hyperlipidemia, hypothyroid, who presents emergency department for chief concerns of cough and wheezing.  05/12: admitted to hospitalist for sepsis d/t RLL pneumonia, AKI, hyperkalemia, HFpEF exacerbation. Started abx, diuresis  05/13: diuresing, (+)Bcx Pseudomonas, continue abx, 05/14: Cr slowly improving. Briefly Afib RVR but this improved w/ diltiazem IV x1 and increased beta blocker, resumed home amiodarone . Per ID, plan repeat BCx following 48h abx, continue cefepime   05/15: HR low overnight, soft BP - reduced beta blocker. Repeat BCx drawn today.  05/16: BCx NG thus far. Planning for home w/ HH and IV abx to continue w/ cefepime . Will need central IV access but CrCl limits PICC, will d/w nephrology and ID      Consultants:  Infectious disease  Palliative care   Procedures/Surgeries: none      ASSESSMENT & PLAN:   Severe sepsis with acute organ dysfunction - improved  Patient has increased respiration rate, leukopenia, and source of infection is right lower lobe pneumonia and bacteremia Treat underlying cause(s) below    Acute hypoxemic respiratory failure Due to PNA and CHF.   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 Palliative care following   Diminished decision-making capacity but not absent  Pt cannot repeat back to me what he is sick with (pneumonia and blood infection from that, CHF), and plan (recs for rehab, will need IV abx) he just keeps saying he does not want to go to rehab and wants to go home when cleared for discharge In my judgment, he has a basic understanding of his medical situation and he can express his overall goals, but he may not grasp complicated concepts very well    N/a based on BMI: There is no height or weight on file to calculate BMI.. Significantly low or high BMI is associated with higher medical risk.  Underweight - under 18  overweight - 25 to 29 obese - 30 or more Class 1 obesity: BMI of 30.0 to 34 Class 2 obesity: BMI of 35.0 to 39 Class 3 obesity: BMI of 40.0 to 49 Super Morbid Obesity: BMI 50-59 Super-super Morbid Obesity: BMI 60+ Healthy nutrition and physical  activity advised as adjunct to other disease management and risk reduction treatments    DVT prophylaxis: no anticoagulation d/r SAH IV fluids: no continuous IV fluids  Nutrition: cardiac Central lines / other devices: none  Code Status: FULL CODE - see palliative note today 10/18/23  ACP documentation reviewed: none on file in [facility]  TOC needs: HH / IV  abx  Medical barriers to dispo: if repeat BCx remain negative and can get central IV access, anticipate medically stable tomorrow. Will need to arrange iV abx outpatient              Subjective / Brief ROS:  Patient has no concerns today  He states he does not want to go to facility for rehab, his nephew can administer IV medications / he'd like to go home.  No CP/SOB Pain controlled.  Denies new weakness.  Tolerating diet.  Reports no concerns w/ urination/defecation.   Family Communication: none at this time     Objective Findings:  Vitals:   10/20/23 0349 10/20/23 0859 10/20/23 1105 10/20/23 1552  BP: 95/61 130/79 (!) 101/59 128/73  Pulse: 84 95 90 87  Resp: 16 18 18 19   Temp: (!) 97.5 F (36.4 C) 98.1 F (36.7 C) 98.4 F (36.9 C) 98 F (36.7 C)  TempSrc:    Oral  SpO2: 96% 97% 97% 95%    Intake/Output Summary (Last 24 hours) at 10/20/2023 1609 Last data filed at 10/20/2023 1400 Gross per 24 hour  Intake 300 ml  Output 850 ml  Net -550 ml   There were no vitals filed for this visit.  Examination:  Physical Exam Constitutional:      General: He is not in acute distress. Cardiovascular:     Rate and Rhythm: Tachycardia present. Rhythm irregular.  Pulmonary:     Effort: Pulmonary effort is normal. No tachypnea or respiratory distress.     Breath sounds: Examination of the right-lower field reveals decreased breath sounds. Examination of the left-lower field reveals decreased breath sounds. Decreased breath sounds present. No wheezing, rhonchi or rales (very faint rales / atelectasis).  Musculoskeletal:     Right lower leg: No edema.     Left lower leg: No edema.  Neurological:     General: No focal deficit present.     Mental Status: He is alert and oriented to person, place, and time.  Psychiatric:        Mood and Affect: Mood normal.        Behavior: Behavior normal.          Scheduled Medications:   amiodarone   200 mg Oral Daily    cyanocobalamin   1,000 mcg Oral Daily   DULoxetine   20 mg Oral QHS   [START ON 10/21/2023] furosemide   40 mg Oral BID   levothyroxine   100 mcg Oral Q0600   metoprolol  tartrate  25 mg Oral BID   pantoprazole   40 mg Oral Daily   simvastatin   40 mg Oral QHS    Continuous Infusions:  ceFEPime  (MAXIPIME ) IV Stopped (10/20/23 1039)    PRN Medications:  acetaminophen  **OR** acetaminophen , diphenoxylate-atropine , guaiFENesin -dextromethorphan , ipratropium-albuterol , ondansetron  **OR** ondansetron  (ZOFRAN ) IV, QUEtiapine , senna-docusate, traMADol   Antimicrobials from admission:  Anti-infectives (From admission, onward)    Start     Dose/Rate Route Frequency Ordered Stop   10/20/23 0000  ceFEPime  (MAXIPIME ) IVPB  Status:  Discontinued        2 g Intravenous Every 24 hours 10/20/23 1533 10/20/23    10/20/23 0000  ceFEPime  (  MAXIPIME ) IVPB        1 g Intravenous Every 12 hours 10/20/23 1534 10/30/23 2359   10/18/23 1000  ceFEPIme  (MAXIPIME ) 1 g in sodium chloride  0.9 % 100 mL IVPB        1 g 200 mL/hr over 30 Minutes Intravenous Every 12 hours 10/18/23 0831     10/17/23 1200  ceFEPIme  (MAXIPIME ) 2 g in sodium chloride  0.9 % 100 mL IVPB  Status:  Discontinued        2 g 200 mL/hr over 30 Minutes Intravenous Every 24 hours 10/16/23 1338 10/18/23 0831   10/17/23 1000  azithromycin (ZITHROMAX) 500 mg in sodium chloride  0.9 % 250 mL IVPB  Status:  Discontinued        500 mg 250 mL/hr over 60 Minutes Intravenous Every 24 hours 10/16/23 1307 10/17/23 0836   10/17/23 0800  vancomycin  variable dose per unstable renal function (pharmacist dosing)  Status:  Discontinued         Does not apply See admin instructions 10/16/23 1344 10/17/23 0836   10/16/23 1330  vancomycin  (VANCOREADY) IVPB 1500 mg/300 mL        1,500 mg 150 mL/hr over 120 Minutes Intravenous  Once 10/16/23 1321 10/16/23 1706   10/16/23 1300  vancomycin  (VANCOCIN ) IVPB 1000 mg/200 mL premix  Status:  Discontinued        1,000 mg 200 mL/hr  over 60 Minutes Intravenous  Once 10/16/23 1252 10/16/23 1321   10/16/23 1300  ceFEPIme  (MAXIPIME ) 2 g in sodium chloride  0.9 % 100 mL IVPB        2 g 200 mL/hr over 30 Minutes Intravenous  Once 10/16/23 1252 10/16/23 1341   10/16/23 1300  azithromycin (ZITHROMAX) 500 mg in sodium chloride  0.9 % 250 mL IVPB        500 mg 250 mL/hr over 60 Minutes Intravenous  Once 10/16/23 1252 10/16/23 1518           Data Reviewed:  I have personally reviewed the following...  CBC: Recent Labs  Lab 10/16/23 1028 10/17/23 0550 10/18/23 0202 10/18/23 1831 10/19/23 0358 10/20/23 0447  WBC 2.0* 2.1* 2.0*  --  3.3* 3.0*  NEUTROABS 1.5*  --   --   --   --   --   HGB 8.0* 7.1* 7.1* 9.3* 8.2* 8.2*  HCT 25.8* 22.6* 22.5* 28.6* 26.0* 25.0*  MCV 93.1 90.4 88.6  --  90.0 89.6  PLT 76* 65* 70*  --  79* 78*   Basic Metabolic Panel: Recent Labs  Lab 10/16/23 1028 10/16/23 1031 10/17/23 0007 10/17/23 0550 10/18/23 0202 10/19/23 0358 10/20/23 0447  NA 137  --   --  135 133* 135 137  K 5.5*  --  4.7 4.5 4.5 4.1 3.7  CL 105  --   --  104 106 104 105  CO2 23  --   --  20* 19* 22 22  GLUCOSE 90  --   --  89 96 111* 96  BUN 58*  --   --  55* 57* 55* 56*  CREATININE 3.70*  --   --  3.50* 3.45* 3.43* 3.48*  CALCIUM  7.5*  --   --  7.1* 6.9* 7.3* 7.6*  MG  --  1.5*  --  1.6* 2.1 2.0 1.8   GFR: Estimated Creatinine Clearance: 17 mL/min (A) (by C-G formula based on SCr of 3.48 mg/dL (H)). Liver Function Tests: Recent Labs  Lab 10/16/23 1028 10/20/23 0447  AST 24 13*  ALT 17 16  ALKPHOS 65 60  BILITOT 1.7* 1.2  PROT 5.1* 4.7*  ALBUMIN  2.3* 2.0*   No results for input(s): "LIPASE", "AMYLASE" in the last 168 hours. No results for input(s): "AMMONIA" in the last 168 hours. Coagulation Profile: No results for input(s): "INR", "PROTIME" in the last 168 hours. Cardiac Enzymes: Recent Labs  Lab 10/16/23 1031  CKTOTAL 77   BNP (last 3 results) No results for input(s): "PROBNP" in the last  8760 hours. HbA1C: No results for input(s): "HGBA1C" in the last 72 hours. CBG: No results for input(s): "GLUCAP" in the last 168 hours. Lipid Profile: No results for input(s): "CHOL", "HDL", "LDLCALC", "TRIG", "CHOLHDL", "LDLDIRECT" in the last 72 hours. Thyroid  Function Tests: No results for input(s): "TSH", "T4TOTAL", "FREET4", "T3FREE", "THYROIDAB" in the last 72 hours. Anemia Panel: No results for input(s): "VITAMINB12", "FOLATE", "FERRITIN", "TIBC", "IRON", "RETICCTPCT" in the last 72 hours. Most Recent Urinalysis On File:     Component Value Date/Time   COLORURINE YELLOW 09/21/2023 1814   APPEARANCEUR CLEAR 09/21/2023 1814   APPEARANCEUR Clear 08/04/2023 1507   LABSPEC 1.019 09/21/2023 1814   PHURINE 5.0 09/21/2023 1814   GLUCOSEU NEGATIVE 09/21/2023 1814   HGBUR NEGATIVE 09/21/2023 1814   BILIRUBINUR NEGATIVE 09/21/2023 1814   BILIRUBINUR Negative 08/04/2023 1507   KETONESUR NEGATIVE 09/21/2023 1814   PROTEINUR 100 (A) 09/21/2023 1814   NITRITE NEGATIVE 09/21/2023 1814   LEUKOCYTESUR NEGATIVE 09/21/2023 1814   Sepsis Labs: @LABRCNTIP (procalcitonin:4,lacticidven:4) Microbiology: Recent Results (from the past 240 hours)  Culture, blood (routine x 2)     Status: Abnormal   Collection Time: 10/16/23 10:32 AM   Specimen: BLOOD  Result Value Ref Range Status   Specimen Description   Final    BLOOD RIGHT ANTECUBITAL Performed at [facility], [address] [place], [place] [address]    Special Requests   Final    BOTTLES DRAWN AEROBIC AND ANAEROBIC Blood Culture adequate volume Performed at [facility], [address], [place], [place] [address]    Culture  Setup Time   Final    GRAM NEGATIVE RODS AEROBIC BOTTLE ONLY CRITICAL RESULT CALLED TO, READ BACK BY AND VERIFIED WITHNoe Bath AT 8295 10/17/23 JG Performed at [facility], [address] [place], [place] [address]    Culture PSEUDOMONAS AERUGINOSA (A)  Final   Report Status  10/19/2023 FINAL  Final   Organism ID, Bacteria PSEUDOMONAS AERUGINOSA  Final      Susceptibility   Pseudomonas aeruginosa - MIC*    CEFTAZIDIME 2 SENSITIVE Sensitive     CIPROFLOXACIN  0.5 SENSITIVE Sensitive     GENTAMICIN  <=1 SENSITIVE Sensitive     IMIPENEM 2 SENSITIVE Sensitive     PIP/TAZO 8 SENSITIVE Sensitive ug/mL    CEFEPIME  2 SENSITIVE Sensitive     * PSEUDOMONAS AERUGINOSA  Blood Culture ID Panel (Reflexed)     Status: Abnormal   Collection Time: 10/16/23 10:32 AM  Result Value Ref Range Status   Enterococcus faecalis NOT DETECTED NOT DETECTED Final   Enterococcus Faecium NOT DETECTED NOT DETECTED Final   Listeria monocytogenes NOT DETECTED NOT DETECTED Final   Staphylococcus species NOT DETECTED NOT DETECTED Final   Staphylococcus aureus (BCID) NOT DETECTED NOT DETECTED Final   Staphylococcus epidermidis NOT DETECTED NOT DETECTED Final   Staphylococcus lugdunensis NOT DETECTED NOT DETECTED Final   Streptococcus species NOT DETECTED NOT DETECTED Final   Streptococcus agalactiae NOT DETECTED NOT DETECTED Final   Streptococcus pneumoniae NOT  DETECTED NOT DETECTED Final   Streptococcus pyogenes NOT DETECTED NOT DETECTED Final   A.calcoaceticus-baumannii NOT DETECTED NOT DETECTED Final   Bacteroides fragilis NOT DETECTED NOT DETECTED Final   Enterobacterales NOT DETECTED NOT DETECTED Final   Enterobacter cloacae complex NOT DETECTED NOT DETECTED Final   Escherichia coli NOT DETECTED NOT DETECTED Final   Klebsiella aerogenes NOT DETECTED NOT DETECTED Final   Klebsiella oxytoca NOT DETECTED NOT DETECTED Final   Klebsiella pneumoniae NOT DETECTED NOT DETECTED Final   Proteus species NOT DETECTED NOT DETECTED Final   Salmonella species NOT DETECTED NOT DETECTED Final   Serratia marcescens NOT DETECTED NOT DETECTED Final   Haemophilus influenzae NOT DETECTED NOT DETECTED Final   Neisseria meningitidis NOT DETECTED NOT DETECTED Final   Pseudomonas aeruginosa DETECTED (A) NOT  DETECTED Final    Comment: CRITICAL RESULT CALLED TO, READ BACK BY AND VERIFIED WITH:  NATHAN BELUE AT 1610 10/17/23 JG    Stenotrophomonas maltophilia NOT DETECTED NOT DETECTED Final   Candida albicans NOT DETECTED NOT DETECTED Final   Candida auris NOT DETECTED NOT DETECTED Final   Candida glabrata NOT DETECTED NOT DETECTED Final   Candida krusei NOT DETECTED NOT DETECTED Final   Candida parapsilosis NOT DETECTED NOT DETECTED Final   Candida tropicalis NOT DETECTED NOT DETECTED Final   Cryptococcus neoformans/gattii NOT DETECTED NOT DETECTED Final   CTX-M ESBL NOT DETECTED NOT DETECTED Final   Carbapenem resistance IMP NOT DETECTED NOT DETECTED Final   Carbapenem resistance KPC NOT DETECTED NOT DETECTED Final   Carbapenem resistance NDM NOT DETECTED NOT DETECTED Final   Carbapenem resistance VIM NOT DETECTED NOT DETECTED Final    Comment: Performed at [facility], [address] [place], [place] [address]  Resp panel by RT-PCR (RSV, Flu A&B, Covid) Anterior Nasal Swab     Status: None   Collection Time: 10/16/23  1:04 PM   Specimen: Anterior Nasal Swab  Result Value Ref Range Status   SARS Coronavirus 2 by RT PCR NEGATIVE NEGATIVE Final    Comment: (NOTE) SARS-CoV-2 target nucleic acids are NOT DETECTED.  The SARS-CoV-2 RNA is generally detectable in upper respiratory specimens during the acute phase of infection. The lowest concentration of SARS-CoV-2 viral copies this assay can detect is 138 copies/mL. A negative result does not preclude SARS-Cov-2 infection and should not be used as the sole basis for treatment or other patient management decisions. A negative result may occur with  improper specimen collection/handling, submission of specimen other than nasopharyngeal swab, presence of viral mutation(s) within the areas targeted by this assay, and inadequate number of viral copies(<138 copies/mL). A negative result must be combined with clinical observations,  patient history, and epidemiological information. The expected result is Negative.  Fact Sheet for Patients:  BloggerCourse.com  Fact Sheet for Healthcare Providers:  SeriousBroker.it  This test is no t yet approved or cleared by the [place]  FDA and  has been authorized for detection and/or diagnosis of SARS-CoV-2 by FDA under an Emergency Use Authorization (EUA). This EUA will remain  in effect (meaning this test can be used) for the duration of the COVID-19 declaration under Section 564(b)(1) of the Act, 21 U.S.C.section 360bbb-3(b)(1), unless the authorization is terminated  or revoked sooner.       Influenza A by PCR NEGATIVE NEGATIVE Final   Influenza B by PCR NEGATIVE NEGATIVE Final    Comment: (NOTE) The Xpert Xpress SARS-CoV-2/FLU/RSV plus assay is intended as an aid in the diagnosis of influenza from  Nasopharyngeal swab specimens and should not be used as a sole basis for treatment. Nasal washings and aspirates are unacceptable for Xpert Xpress SARS-CoV-2/FLU/RSV testing.  Fact Sheet for Patients: BloggerCourse.com  Fact Sheet for Healthcare Providers: SeriousBroker.it  This test is not yet approved or cleared by the [place]  FDA and has been authorized for detection and/or diagnosis of SARS-CoV-2 by FDA under an Emergency Use Authorization (EUA). This EUA will remain in effect (meaning this test can be used) for the duration of the COVID-19 declaration under Section 564(b)(1) of the Act, 21 U.S.C. section 360bbb-3(b)(1), unless the authorization is terminated or revoked.     Resp Syncytial Virus by PCR NEGATIVE NEGATIVE Final    Comment: (NOTE) Fact Sheet for Patients: BloggerCourse.com  Fact Sheet for Healthcare Providers: SeriousBroker.it  This test is not yet approved or cleared by the [place] FDA and has been authorized for detection and/or diagnosis of SARS-CoV-2 by FDA under an Emergency Use Authorization (EUA). This EUA will remain in effect (meaning this test can be used) for the duration of the COVID-19 declaration under Section 564(b)(1) of the Act, 21 U.S.C. section 360bbb-3(b)(1), unless the authorization is terminated or revoked.  Performed at [facility], [address] [place], [place] [address]   Culture, blood (routine x 2)     Status: None (Preliminary result)   Collection Time: 10/17/23 12:07 AM   Specimen: BLOOD RIGHT ARM  Result Value Ref Range Status   Specimen Description BLOOD RIGHT ARM  Final   Special Requests   Final    BOTTLES DRAWN AEROBIC AND ANAEROBIC Blood Culture adequate volume   Culture   Final    NO GROWTH 3 DAYS Performed at [facility], [address] [place], [place] [address]    Report Status PENDING  Incomplete  MRSA Next Gen by PCR, Nasal     Status: None   Collection Time: 10/17/23  9:08 PM   Specimen: Nasal Mucosa; Nasal Swab  Result Value Ref Range Status   MRSA by PCR Next Gen NOT DETECTED NOT DETECTED Final    Comment: (NOTE) The GeneXpert MRSA Assay (FDA approved for NASAL specimens only), is one component of a comprehensive MRSA colonization surveillance program. It is not intended to diagnose MRSA infection nor to guide or monitor treatment for MRSA infections. Test performance is not FDA approved in patients less than 23 years old. Performed at [facility], [address] [place], [place] [address]   Culture, blood (Routine X 2) w Reflex to ID Panel     Status: None (Preliminary result)   Collection Time: 10/19/23  8:59 AM   Specimen: BLOOD LEFT HAND  Result Value Ref Range Status   Specimen Description BLOOD LEFT HAND  Final   Special Requests   Final    BOTTLES DRAWN AEROBIC AND ANAEROBIC Blood Culture adequate volume   Culture   Final    NO GROWTH < 24 HOURS Performed  at [facility], [address] [place], [place] [address]    Report Status PENDING  Incomplete  Culture, blood (Routine X 2) w Reflex to ID Panel     Status: None (Preliminary result)   Collection Time: 10/19/23  9:06 AM   Specimen: BLOOD RIGHT HAND  Result Value Ref Range Status   Specimen Description BLOOD RIGHT HAND  Final   Special Requests   Final    BOTTLES DRAWN AEROBIC AND ANAEROBIC Blood Culture adequate volume   Culture  Final    NO GROWTH < 24 HOURS Performed at [facility], [address] [place], [place] [address]    Report Status PENDING  Incomplete  Expectorated Sputum Assessment w Gram Stain, Rflx to Resp Cult     Status: None   Collection Time: 10/19/23 10:20 PM   Specimen: Expectorated Sputum  Result Value Ref Range Status   Specimen Description EXPECTORATED SPUTUM  Final   Special Requests NONE  Final   Sputum evaluation   Final    THIS SPECIMEN IS ACCEPTABLE FOR SPUTUM CULTURE Performed at [facility], [address], [place], [place] [address]    Report Status 10/19/2023 FINAL  Final  Culture, Respiratory w Gram Stain     Status: None (Preliminary result)   Collection Time: 10/19/23 10:20 PM  Result Value Ref Range Status   Specimen Description   Final    EXPECTORATED SPUTUM Performed at [facility], [address], [place], [place] [address]    Special Requests   Final    NONE Reflexed from [id number] Performed at [facility], [address] [place], [place] [address]    Gram Stain   Final    RARE WBC PRESENT, PREDOMINANTLY PMN MODERATE GRAM POSITIVE COCCI MODERATE GRAM NEGATIVE RODS Performed at [facility], [address] [place], [place] [address]    Culture PENDING  Incomplete   Report Status PENDING  Incomplete      Radiology Studies last 3 days: No results found.         Ellery Tash, DO [facility] 10/20/2023, 4:09 PM    Dictation software may have been  used to generate the above note. Typos may occur and escape review in typed/dictated notes. Please contact Dr Authur Leghorn directly for clarity if needed.  Staff may message me via secure chat in Epic  but this may not receive an immediate response,  please page me for urgent matters!  If 7PM-7AM, please contact night coverage [email or web address]

## 2023-10-20 NOTE — Treatment Plan (Signed)
 Diagnosis: Pseudomonas bacteremia Rt lung pneumonia Baseline Creatinine ckd cr 3.5 Crcl 17    Allergies  Allergen Reactions   Quinolones Other (See Comments)    Patient with aortic aneurysm. Use of quinolones are considered to be contraindicated due to risk of aortic rupture.   Meloxicam Nausea And Vomiting    OPAT Orders Cefepime  1 gram IV every 12 hours   End Date: 10/30/23  [facility] Care Per Protocol:  Labs weekly on Monday /Tuesday while on IV antibiotics: _X_ CBC with differential  _X_ CMP _ _X_ Please pull PIC at completion of IV antibiotics  Fax weekly lab results  promptly to [phone]  Clinic Follow Up Appt: not needed with Dr.Tazaria Dlugosz   Call [phone] with any questions

## 2023-10-20 NOTE — TOC Progression Note (Addendum)
 Transition of Care [facility]) - Progression Note    Patient Details  Name: [name] MRN: [id number] Date of Birth: 03/03/1941  Transition of Care [facility]) CM/SW Contact  Zoe Hinds, RN Phone Number: 10/20/2023, 3:30 PM  Clinical Narrative:    This CM alerted by [facility] he started DC coordination for pt's for IV infusion needs. This CM spoke with pt and wife and confirmed they did not have a HH agency preference. This CM spoke with  liaison @ [employer]  and confirmed SOC tentatively for this [place] for pt's dc HH IV abx infusion with [name]  & provided Pam  pt's nephew Tommy's info to coordinate with him to do bed side teach @[phone]. TOC will cont to follow dc planning / care coordination and update as applicable.  Expected Discharge Plan: OP Rehab Barriers to Discharge: Continued Medical Work up  Expected Discharge Plan and Services       Living arrangements for the past 2 months: Single Family Home                                       Social Determinants of Health (SDOH) Interventions SDOH Screenings   Food Insecurity: No Food Insecurity (10/16/2023)  Housing: Low Risk  (10/16/2023)  Transportation Needs: No Transportation Needs (10/16/2023)  Utilities: Not At Risk (10/16/2023)  Alcohol Screen: Low Risk  (07/12/2021)  Depression (PHQ2-9): Low Risk  (04/04/2023)  Financial Resource Strain: Low Risk  (07/12/2021)  Physical Activity: Inactive (07/12/2021)  Social Connections: Socially Integrated (10/16/2023)  Stress: No Stress Concern Present (07/12/2021)  Tobacco Use: Medium Risk (10/16/2023)  Health Literacy: Medium Risk (06/09/2023)   Received from [facility]    Readmission Risk Interventions    10/17/2023    3:18 PM 04/21/2023   10:16 AM  Readmission Risk Prevention Plan  Transportation Screening Complete Complete  HRI or Home Care Consult  Complete  Social Work Consult for Recovery Care Planning/Counseling  Complete  Palliative Care Screening  Not Applicable   Medication Review Oceanographer) Complete Complete  PCP or Specialist appointment within 3-5 days of discharge Complete   SW Recovery Care/Counseling Consult Complete   Palliative Care Screening Not Applicable   Skilled Nursing Facility Not Applicable

## 2023-10-20 NOTE — TOC Progression Note (Signed)
 Transition of Care [facility]) - Progression Note    Patient Details  Name: [name] MRN: [id number] Date of Birth: 31-Jul-1940  Transition of Care [facility] CM/SW Contact  Zoe Hinds, RN Phone Number: 10/20/2023, 1:21 PM  Clinical Narrative:    This CM  at bedside explained role and spoke with pt regarding PT recommendations for SNF / Rehab. Pt declined wanting to dc to SNF for rehab . This  CM explained in detail regarding safety concerns regarding pt declining  PT recommendations  , pt verbalized understanding & informed his son is a EMT lives with him and will assist him with his needs at dc . This CM updated medical team. TOC will cont to follow dc planning / care coordination and update as applicable.   Expected Discharge Plan: OP Rehab Barriers to Discharge: Continued Medical Work up  Expected Discharge Plan and Services  TBD      Living arrangements for the past 2 months: Single Family Home                                       Social Determinants of Health (SDOH) Interventions SDOH Screenings   Food Insecurity: No Food Insecurity (10/16/2023)  Housing: Low Risk  (10/16/2023)  Transportation Needs: No Transportation Needs (10/16/2023)  Utilities: Not At Risk (10/16/2023)  Alcohol Screen: Low Risk  (07/12/2021)  Depression (PHQ2-9): Low Risk  (04/04/2023)  Financial Resource Strain: Low Risk  (07/12/2021)  Physical Activity: Inactive (07/12/2021)  Social Connections: Socially Integrated (10/16/2023)  Stress: No Stress Concern Present (07/12/2021)  Tobacco Use: Medium Risk (10/16/2023)  Health Literacy: Medium Risk (06/09/2023)   Received from [facility]    Readmission Risk Interventions    10/17/2023    3:18 PM 04/21/2023   10:16 AM  Readmission Risk Prevention Plan  Transportation Screening Complete Complete  HRI or Home Care Consult  Complete  Social Work Consult for Recovery Care Planning/Counseling  Complete  Palliative Care Screening  Not Applicable   Medication Review Oceanographer) Complete Complete  PCP or Specialist appointment within 3-5 days of discharge Complete   SW Recovery Care/Counseling Consult Complete   Palliative Care Screening Not Applicable   Skilled Nursing Facility Not Applicable

## 2023-10-20 NOTE — Progress Notes (Signed)
 Physical Therapy Treatment Patient Details Name: [name] MRN: [id number] DOB: Feb 17, 1941 Today's Date: 10/20/2023   History of Present Illness 83 yo M hosptialized in April after falling at home. CT showed small acute subarachnoid hemorrhage, MRI of L shoulder noted Supraspinatus and infraspinatus tendinosis with partial-thickness tear. No here with sepsis, SOB, tachycardia. PMH Covid, HLD, HTN, hypothyroidism, myelofibrosis, skin CA, CVA, back surgery    PT Comments  Pt in bed, inc BM.  He is able to get to EOB with min a x 1 and rails.  Walks to bathroom with RW and cga/min a x 1 for bathing and new linens.  After walking back out to recliner, he is able to walk an additional 30' with RW and cga/min a x 1 limited by general fatigued but remained up in recliner after session.   Pt is stating he wants to go home vs SNF.  Pt would benefit from continued HHPT/OT/nursing services if he chooses to go home.  RW, BSC, and wheelchair/cushion along with +1 24 hour support for mobility given fall risk.   If plan is discharge home, recommend the following: Assist for transportation;Supervision due to cognitive status;Help with stairs or ramp for entrance;Assistance with cooking/housework;A little help with walking and/or transfers   Can travel by private vehicle        Equipment Recommendations  Rolling walker (2 wheels);BSC/3in1;Wheelchair cushion (measurements PT);Wheelchair (measurements PT)    Recommendations for Other Services       Precautions / Restrictions Precautions Precautions: Fall Recall of Precautions/Restrictions: Intact Precaution/Restrictions Comments: LUE WBAT Restrictions Weight Bearing Restrictions Per Provider Order: Yes LUE Weight Bearing Per Provider Order: Weight bearing as tolerated     Mobility  Bed Mobility Overal bed mobility: Needs Assistance Bed Mobility: Sit to Supine     Supine to sit: Min assist, Used rails       Patient Response:  Cooperative  Transfers Overall transfer level: Needs assistance Equipment used: Rolling walker (2 wheels) Transfers: Sit to/from Stand Sit to Stand: Contact guard assist, Min assist                Ambulation/Gait Ambulation/Gait assistance: Min assist, Contact guard assist Gait Distance (Feet): 30 Feet Assistive device: Rolling walker (2 wheels)   Gait velocity: decreased     General Gait Details: generally unsteady and weak but no LOB's   Stairs             Wheelchair Mobility     Tilt Bed Tilt Bed Patient Response: Cooperative  Modified Rankin (Stroke Patients Only)       Balance Overall balance assessment: History of Falls, Needs assistance Sitting-balance support: No upper extremity supported, Feet supported Sitting balance-Leahy Scale: Fair     Standing balance support: Bilateral upper extremity supported Standing balance-Leahy Scale: Fair Standing balance comment: reliant on RW/UEs                            Communication Communication Communication: Impaired Factors Affecting Communication: Hearing impaired  Cognition Arousal: Alert Behavior During Therapy: WFL for tasks assessed/performed   PT - Cognitive impairments: Safety/Judgement                         Following commands: Intact      Cueing Cueing Techniques: Verbal cues, Gestural cues, Tactile cues  Exercises Other Exercises Other Exercises: inc BM and taken to bathroom for bathing and care    General  Comments        Pertinent Vitals/Pain Pain Assessment Pain Assessment: No/denies pain Pain Intervention(s): Monitored during session    Home Living                          Prior Function            PT Goals (current goals can now be found in the care plan section) Progress towards PT goals: Progressing toward goals    Frequency    Min 1X/week      PT Plan      Co-evaluation              AM-PAC PT "6 Clicks"  Mobility   Outcome Measure  Help needed turning from your back to your side while in a flat bed without using bedrails?: A Little Help needed moving from lying on your back to sitting on the side of a flat bed without using bedrails?: A Little Help needed moving to and from a bed to a chair (including a wheelchair)?: A Little Help needed standing up from a chair using your arms (e.g., wheelchair or bedside chair)?: A Little Help needed to walk in hospital room?: A Little Help needed climbing 3-5 steps with a railing? : A Little 6 Click Score: 18    End of Session Equipment Utilized During Treatment: Gait belt Activity Tolerance: Patient tolerated treatment well Patient left: with call bell/phone within reach;in chair;with chair alarm set Nurse Communication: Mobility status PT Visit Diagnosis: Unsteadiness on feet (R26.81);Muscle weakness (generalized) (M62.81);History of falling (Z91.81);Difficulty in walking, not elsewhere classified (R26.2)     Time: 6045-4098 PT Time Calculation (min) (ACUTE ONLY): 29 min  Charges:    $Gait Training: 8-22 mins $Therapeutic Activity: 8-22 mins PT General Charges $$ ACUTE PT VISIT: 1 Visit                    Charlanne Cong, PTA 10/20/23, 11:49 AM

## 2023-10-20 NOTE — Plan of Care (Signed)

## 2023-10-21 ENCOUNTER — Other Ambulatory Visit: Payer: Self-pay

## 2023-10-21 DIAGNOSIS — B965 Pseudomonas (aeruginosa) (mallei) (pseudomallei) as the cause of diseases classified elsewhere: Secondary | ICD-10-CM | POA: Diagnosis not present

## 2023-10-21 DIAGNOSIS — J151 Pneumonia due to Pseudomonas: Secondary | ICD-10-CM | POA: Diagnosis not present

## 2023-10-21 DIAGNOSIS — R7881 Bacteremia: Secondary | ICD-10-CM | POA: Diagnosis not present

## 2023-10-21 LAB — CBC
HCT: 24.5 % — ABNORMAL LOW (ref 39.0–52.0)
Hemoglobin: 7.8 g/dL — ABNORMAL LOW (ref 13.0–17.0)
MCH: 29 pg (ref 26.0–34.0)
MCHC: 31.8 g/dL (ref 30.0–36.0)
MCV: 91.1 fL (ref 80.0–100.0)
Platelets: 94 10*3/uL — ABNORMAL LOW (ref 150–400)
RBC: 2.69 MIL/uL — ABNORMAL LOW (ref 4.22–5.81)
RDW: 17.9 % — ABNORMAL HIGH (ref 11.5–15.5)
WBC: 3.2 10*3/uL — ABNORMAL LOW (ref 4.0–10.5)
nRBC: 0 % (ref 0.0–0.2)

## 2023-10-21 LAB — BASIC METABOLIC PANEL WITH GFR
Anion gap: 9 (ref 5–15)
BUN: 56 mg/dL — ABNORMAL HIGH (ref 8–23)
CO2: 21 mmol/L — ABNORMAL LOW (ref 22–32)
Calcium: 7.4 mg/dL — ABNORMAL LOW (ref 8.9–10.3)
Chloride: 105 mmol/L (ref 98–111)
Creatinine, Ser: 3.44 mg/dL — ABNORMAL HIGH (ref 0.61–1.24)
GFR, Estimated: 17 mL/min — ABNORMAL LOW (ref >60)
Glucose, Bld: 92 mg/dL (ref 70–99)
Potassium: 3.7 mmol/L (ref 3.5–5.1)
Sodium: 135 mmol/L (ref 135–145)

## 2023-10-21 LAB — MAGNESIUM: Magnesium: 1.8 mg/dL (ref 1.7–2.4)

## 2023-10-21 MED ORDER — ACETAMINOPHEN 650 MG RE SUPP: 650.0000 mg | Freq: Four times a day (QID) | RECTAL | Status: DC | PRN

## 2023-10-21 MED ORDER — ACETAMINOPHEN 325 MG PO TABS
650.0000 mg | ORAL_TABLET | Freq: Four times a day (QID) | ORAL | Status: DC | PRN
  Administered 2023-10-22: 650 mg via ORAL
  Filled 2023-10-21: qty 2

## 2023-10-21 MED ORDER — ONDANSETRON HCL 4 MG PO TABS
4.0000 mg | ORAL_TABLET | Freq: Four times a day (QID) | ORAL | Status: DC | PRN
  Administered 2023-10-23: 4 mg via ORAL
  Filled 2023-10-21: qty 1

## 2023-10-21 MED ORDER — ONDANSETRON HCL 4 MG/2ML IJ SOLN: 4.0000 mg | Freq: Four times a day (QID) | INTRAMUSCULAR | Status: DC | PRN

## 2023-10-21 NOTE — Progress Notes (Addendum)
 PROGRESS NOTE    [name]   WNU:[id number] DOB: 07/06/1940  DOA: 10/16/2023 Date of Service: 10/21/23 which is hospital day 5  PCP: [name], [facility] course / significant events:   HPI: [name] is a 83 year old male with history of traumatic subarachnoid hemorrhage, myelofibrosis, symptomatic anemia, persistent atrial fibrillation, mild cognitive impairment, hypothyroid, GERD, BPH, hyperlipidemia, hypothyroid, who presents emergency department for chief concerns of cough and wheezing.  05/12: admitted to hospitalist for sepsis d/t RLL pneumonia, AKI, hyperkalemia, HFpEF exacerbation. Started abx, diuresis  05/13: diuresing, (+)Bcx Pseudomonas, continue abx, 05/14: Cr slowly improving. Briefly Afib RVR but this improved w/ diltiazem  IV x1 and increased beta blocker, resumed home amiodarone . Per ID, plan repeat BCx following 48h abx, continue cefepime   05/15: HR low overnight, soft BP - reduced beta blocker. Repeat BCx drawn today.  05/16: BCx NG thus far. Planning for home w/ HH and IV abx to continue w/ cefepime . Per nephrology - ok for midline or PICC despite CrCl 05/17: orders in for PICC, TOC working on [facility] (should be able to start tomorrow) and IV abx, DME       Consultants:  Infectious disease  Palliative care   Procedures/Surgeries: none      ASSESSMENT & PLAN:   Severe sepsis with acute organ dysfunction - improved  Patient has increased respiration rate, leukopenia, and source of infection is right lower lobe pneumonia and bacteremia Treat underlying cause(s) below    Acute hypoxemic respiratory failure Due to PNA and CHF.   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 monitor BMP Per nephrology - ok for midline or PICC despite CrCl   Hyperkalemia - resolved  In setting of acute kidney injury Monitor BMP   Hypomagnesemia monitor and supplement PRN   Weakness Pt declining SNF rehab   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 Palliative care following   Diminished decision-making capacity but not absent  Pt cannot repeat back to me what he is sick with (pneumonia and blood infection from that, CHF), and plan (recs for rehab, will need IV abx) he just keeps saying he does not want to go to rehab and wants to go home when cleared for discharge In my judgment, he has a basic understanding of his medical situation and he can express his overall goals, but he may not grasp complicated concepts very well    N/a based on BMI: There is no height or weight on file to calculate BMI.. Significantly low or high BMI is associated with higher medical risk.  Underweight - under 18  overweight - 25 to 29 obese - 30 or more Class 1 obesity: BMI of 30.0 to 34 Class 2 obesity: BMI of  35.0 to 39 Class 3 obesity: BMI of 40.0 to 49 Super Morbid Obesity: BMI 50-59 Super-super Morbid Obesity: BMI 60+ Healthy nutrition and physical activity advised as adjunct to other disease management and risk reduction treatments    DVT prophylaxis: no anticoagulation d/t SAH IV fluids: no continuous IV fluids  Nutrition: cardiac diet  Central lines / other devices: none  Code  Status: FULL CODE - see palliative note 10/18/23  ACP documentation reviewed: none on file in [facility]  TOC needs: HH / IV abx. Will need to arrange iV abx outpatient and DME Medical barriers to dispo: once get central IV access, will be medically ready - unable to place PICC, will need line w/ IR on Mon              Subjective / Brief ROS:  Patient has no concerns today  No CP/SOB Pain controlled.  Denies new weakness.  Tolerating diet.  Reports no concerns w/ urination/defecation.   Family Communication: call to wife 10/21/23 4:48 PM all questions answered     Objective Findings:  Vitals:   10/21/23 0731 10/21/23 0758 10/21/23 1149 10/21/23 1600  BP: 111/73 111/73 120/83 110/72  Pulse: 86 86 (!) 44 78  Resp: 16     Temp: 98.6 F (37 C)  98 F (36.7 C) 98.6 F (37 C)  TempSrc:      SpO2: 98%  98% 98%    Intake/Output Summary (Last 24 hours) at 10/21/2023 1648 Last data filed at 10/21/2023 1300 Gross per 24 hour  Intake 340 ml  Output 1350 ml  Net -1010 ml   There were no vitals filed for this visit.  Examination:  Physical Exam Constitutional:      General: He is not in acute distress. Cardiovascular:     Rate and Rhythm: Normal rate and regular rhythm.  Pulmonary:     Effort: Pulmonary effort is normal. No tachypnea or respiratory distress.     Breath sounds: No decreased breath sounds, wheezing, rhonchi or rales.  Musculoskeletal:     Right lower leg: No edema.     Left lower leg: No edema.  Neurological:     General: No focal deficit present.     Mental Status: He is alert and oriented to person, place, and time.  Psychiatric:        Mood and Affect: Mood normal.        Behavior: Behavior normal.          Scheduled Medications:   amiodarone   200 mg Oral Daily   cyanocobalamin   1,000 mcg Oral Daily   DULoxetine   20 mg Oral QHS   furosemide   40 mg Oral BID   levothyroxine   100 mcg Oral Q0600   metoprolol  tartrate  25 mg Oral BID    pantoprazole   40 mg Oral Daily   simvastatin   40 mg Oral QHS    Continuous Infusions:  ceFEPime  (MAXIPIME ) IV 1 g (10/21/23 1005)    PRN Medications:  acetaminophen  **OR** acetaminophen , diphenoxylate -atropine , guaiFENesin -dextromethorphan , ipratropium-albuterol , ondansetron  **OR** ondansetron  (ZOFRAN ) IV, QUEtiapine , senna-docusate, traMADol   Antimicrobials from admission:  Anti-infectives (From admission, onward)    Start     Dose/Rate Route Frequency Ordered Stop   10/20/23 0000  ceFEPime  (MAXIPIME ) IVPB  Status:  Discontinued        2 g Intravenous Every 24 hours 10/20/23 1533 10/20/23    10/20/23 0000  ceFEPime  (MAXIPIME ) IVPB        1 g Intravenous Every 12 hours 10/20/23  1534 10/30/23 2359   10/18/23 1000  ceFEPIme  (MAXIPIME ) 1 g in sodium chloride  0.9 % 100 mL IVPB        1 g 200 mL/hr over 30 Minutes Intravenous Every 12 hours 10/18/23 0831     10/17/23 1200  ceFEPIme  (MAXIPIME ) 2 g in sodium chloride  0.9 % 100 mL IVPB  Status:  Discontinued        2 g 200 mL/hr over 30 Minutes Intravenous Every 24 hours 10/16/23 1338 10/18/23 0831   10/17/23 1000  azithromycin  (ZITHROMAX ) 500 mg in sodium chloride  0.9 % 250 mL IVPB  Status:  Discontinued        500 mg 250 mL/hr over 60 Minutes Intravenous Every 24 hours 10/16/23 1307 10/17/23 0836   10/17/23 0800  vancomycin  variable dose per unstable renal function (pharmacist dosing)  Status:  Discontinued         Does not apply See admin instructions 10/16/23 1344 10/17/23 0836   10/16/23 1330  vancomycin  (VANCOREADY) IVPB 1500 mg/300 mL        1,500 mg 150 mL/hr over 120 Minutes Intravenous  Once 10/16/23 1321 10/16/23 1706   10/16/23 1300  vancomycin  (VANCOCIN ) IVPB 1000 mg/200 mL premix  Status:  Discontinued        1,000 mg 200 mL/hr over 60 Minutes Intravenous  Once 10/16/23 1252 10/16/23 1321   10/16/23 1300  ceFEPIme  (MAXIPIME ) 2 g in sodium chloride  0.9 % 100 mL IVPB        2 g 200 mL/hr over 30 Minutes Intravenous  Once  10/16/23 1252 10/16/23 1341   10/16/23 1300  azithromycin  (ZITHROMAX ) 500 mg in sodium chloride  0.9 % 250 mL IVPB        500 mg 250 mL/hr over 60 Minutes Intravenous  Once 10/16/23 1252 10/16/23 1518           Data Reviewed:  I have personally reviewed the following...  CBC: Recent Labs  Lab 10/16/23 1028 10/17/23 0550 10/18/23 0202 10/18/23 1831 10/19/23 0358 10/20/23 0447 10/21/23 0452  WBC 2.0* 2.1* 2.0*  --  3.3* 3.0* 3.2*  NEUTROABS 1.5*  --   --   --   --   --   --   HGB 8.0* 7.1* 7.1* 9.3* 8.2* 8.2* 7.8*  HCT 25.8* 22.6* 22.5* 28.6* 26.0* 25.0* 24.5*  MCV 93.1 90.4 88.6  --  90.0 89.6 91.1  PLT 76* 65* 70*  --  79* 78* 94*   Basic Metabolic Panel: Recent Labs  Lab 10/17/23 0550 10/18/23 0202 10/19/23 0358 10/20/23 0447 10/21/23 0452  NA 135 133* 135 137 135  K 4.5 4.5 4.1 3.7 3.7  CL 104 106 104 105 105  CO2 20* 19* 22 22 21*  GLUCOSE 89 96 111* 96 92  BUN 55* 57* 55* 56* 56*  CREATININE 3.50* 3.45* 3.43* 3.48* 3.44*  CALCIUM  7.1* 6.9* 7.3* 7.6* 7.4*  MG 1.6* 2.1 2.0 1.8 1.8   GFR: Estimated Creatinine Clearance: 17.2 mL/min (A) (by C-G formula based on SCr of 3.44 mg/dL (H)). Liver Function Tests: Recent Labs  Lab 10/16/23 1028 10/20/23 0447  AST 24 13*  ALT 17 16  ALKPHOS 65 60  BILITOT 1.7* 1.2  PROT 5.1* 4.7*  ALBUMIN  2.3* 2.0*   No results for input(s): "LIPASE", "AMYLASE" in the last 168 hours. No results for input(s): "AMMONIA" in the last 168 hours. Coagulation Profile: No results for input(s): "INR", "PROTIME" in the last 168 hours. Cardiac Enzymes: Recent Labs  Lab 10/16/23  1031  CKTOTAL 77   BNP (last 3 results) No results for input(s): "PROBNP" in the last 8760 hours. HbA1C: No results for input(s): "HGBA1C" in the last 72 hours. CBG: No results for input(s): "GLUCAP" in the last 168 hours. Lipid Profile: No results for input(s): "CHOL", "HDL", "LDLCALC", "TRIG", "CHOLHDL", "LDLDIRECT" in the last 72 hours. Thyroid   Function Tests: No results for input(s): "TSH", "T4TOTAL", "FREET4", "T3FREE", "THYROIDAB" in the last 72 hours. Anemia Panel: No results for input(s): "VITAMINB12", "FOLATE", "FERRITIN", "TIBC", "IRON", "RETICCTPCT" in the last 72 hours. Most Recent Urinalysis On File:     Component Value Date/Time   COLORURINE YELLOW 09/21/2023 1814   APPEARANCEUR CLEAR 09/21/2023 1814   APPEARANCEUR Clear 08/04/2023 1507   LABSPEC 1.019 09/21/2023 1814   PHURINE 5.0 09/21/2023 1814   GLUCOSEU NEGATIVE 09/21/2023 1814   HGBUR NEGATIVE 09/21/2023 1814   BILIRUBINUR NEGATIVE 09/21/2023 1814   BILIRUBINUR Negative 08/04/2023 1507   KETONESUR NEGATIVE 09/21/2023 1814   PROTEINUR 100 (A) 09/21/2023 1814   NITRITE NEGATIVE 09/21/2023 1814   LEUKOCYTESUR NEGATIVE 09/21/2023 1814   Sepsis Labs: @LABRCNTIP (procalcitonin:4,lacticidven:4) Microbiology: Recent Results (from the past 240 hours)  Culture, blood (routine x 2)     Status: Abnormal   Collection Time: 10/16/23 10:32 AM   Specimen: BLOOD  Result Value Ref Range Status   Specimen Description   Final    BLOOD RIGHT ANTECUBITAL Performed at [facility], [address] [place], [place] [address]    Special Requests   Final    BOTTLES DRAWN AEROBIC AND ANAEROBIC Blood Culture adequate volume Performed at [facility], [address], [place], [place] [address]    Culture  Setup Time   Final    GRAM NEGATIVE RODS AEROBIC BOTTLE ONLY CRITICAL RESULT CALLED TO, READ BACK BY AND VERIFIED WITHNoe Bath AT 0981 10/17/23 JG Performed at [facility], [address] [place], [place] [address]    Culture PSEUDOMONAS AERUGINOSA (A)  Final   Report Status 10/19/2023 FINAL  Final   Organism ID, Bacteria PSEUDOMONAS AERUGINOSA  Final      Susceptibility   Pseudomonas aeruginosa - MIC*    CEFTAZIDIME 2 SENSITIVE Sensitive     CIPROFLOXACIN  0.5 SENSITIVE Sensitive     GENTAMICIN  <=1 SENSITIVE Sensitive     IMIPENEM 2  SENSITIVE Sensitive     PIP/TAZO 8 SENSITIVE Sensitive ug/mL    CEFEPIME  2 SENSITIVE Sensitive     * PSEUDOMONAS AERUGINOSA  Blood Culture ID Panel (Reflexed)     Status: Abnormal   Collection Time: 10/16/23 10:32 AM  Result Value Ref Range Status   Enterococcus faecalis NOT DETECTED NOT DETECTED Final   Enterococcus Faecium NOT DETECTED NOT DETECTED Final   Listeria monocytogenes NOT DETECTED NOT DETECTED Final   Staphylococcus species NOT DETECTED NOT DETECTED Final   Staphylococcus aureus (BCID) NOT DETECTED NOT DETECTED Final   Staphylococcus epidermidis NOT DETECTED NOT DETECTED Final   Staphylococcus lugdunensis NOT DETECTED NOT DETECTED Final   Streptococcus species NOT DETECTED NOT DETECTED Final   Streptococcus agalactiae NOT DETECTED NOT DETECTED Final   Streptococcus pneumoniae NOT DETECTED NOT DETECTED Final   Streptococcus pyogenes NOT DETECTED NOT DETECTED Final   A.calcoaceticus-baumannii NOT DETECTED NOT DETECTED Final   Bacteroides fragilis NOT DETECTED NOT DETECTED Final   Enterobacterales NOT DETECTED NOT DETECTED Final   Enterobacter cloacae complex NOT DETECTED NOT DETECTED Final   Escherichia coli NOT DETECTED NOT DETECTED Final   Klebsiella aerogenes NOT  DETECTED NOT DETECTED Final   Klebsiella oxytoca NOT DETECTED NOT DETECTED Final   Klebsiella pneumoniae NOT DETECTED NOT DETECTED Final   Proteus species NOT DETECTED NOT DETECTED Final   Salmonella species NOT DETECTED NOT DETECTED Final   Serratia marcescens NOT DETECTED NOT DETECTED Final   Haemophilus influenzae NOT DETECTED NOT DETECTED Final   Neisseria meningitidis NOT DETECTED NOT DETECTED Final   Pseudomonas aeruginosa DETECTED (A) NOT DETECTED Final    Comment: CRITICAL RESULT CALLED TO, READ BACK BY AND VERIFIED WITH:  NATHAN BELUE AT 1610 10/17/23 JG    Stenotrophomonas maltophilia NOT DETECTED NOT DETECTED Final   Candida albicans NOT DETECTED NOT DETECTED Final   Candida auris NOT DETECTED NOT  DETECTED Final   Candida glabrata NOT DETECTED NOT DETECTED Final   Candida krusei NOT DETECTED NOT DETECTED Final   Candida parapsilosis NOT DETECTED NOT DETECTED Final   Candida tropicalis NOT DETECTED NOT DETECTED Final   Cryptococcus neoformans/gattii NOT DETECTED NOT DETECTED Final   CTX-M ESBL NOT DETECTED NOT DETECTED Final   Carbapenem resistance IMP NOT DETECTED NOT DETECTED Final   Carbapenem resistance KPC NOT DETECTED NOT DETECTED Final   Carbapenem resistance NDM NOT DETECTED NOT DETECTED Final   Carbapenem resistance VIM NOT DETECTED NOT DETECTED Final    Comment: Performed at [facility], [address] [place], [place] [address]  Resp panel by RT-PCR (RSV, Flu A&B, Covid) Anterior Nasal Swab     Status: None   Collection Time: 10/16/23  1:04 PM   Specimen: Anterior Nasal Swab  Result Value Ref Range Status   SARS Coronavirus 2 by RT PCR NEGATIVE NEGATIVE Final    Comment: (NOTE) SARS-CoV-2 target nucleic acids are NOT DETECTED.  The SARS-CoV-2 RNA is generally detectable in upper respiratory specimens during the acute phase of infection. The lowest concentration of SARS-CoV-2 viral copies this assay can detect is 138 copies/mL. A negative result does not preclude SARS-Cov-2 infection and should not be used as the sole basis for treatment or other patient management decisions. A negative result may occur with  improper specimen collection/handling, submission of specimen other than nasopharyngeal swab, presence of viral mutation(s) within the areas targeted by this assay, and inadequate number of viral copies(<138 copies/mL). A negative result must be combined with clinical observations, patient history, and epidemiological information. The expected result is Negative.  Fact Sheet for Patients:  BloggerCourse.com  Fact Sheet for Healthcare Providers:  SeriousBroker.it  This test is no t yet approved or  cleared by the [place]  FDA and  has been authorized for detection and/or diagnosis of SARS-CoV-2 by FDA under an Emergency Use Authorization (EUA). This EUA will remain  in effect (meaning this test can be used) for the duration of the COVID-19 declaration under Section 564(b)(1) of the Act, 21 U.S.C.section 360bbb-3(b)(1), unless the authorization is terminated  or revoked sooner.       Influenza A by PCR NEGATIVE NEGATIVE Final   Influenza B by PCR NEGATIVE NEGATIVE Final    Comment: (NOTE) The Xpert Xpress SARS-CoV-2/FLU/RSV plus assay is intended as an aid in the diagnosis of influenza from Nasopharyngeal swab specimens and should not be used as a sole basis for treatment. Nasal washings and aspirates are unacceptable for Xpert Xpress SARS-CoV-2/FLU/RSV testing.  Fact Sheet for Patients: BloggerCourse.com  Fact Sheet for Healthcare Providers: SeriousBroker.it  This test is not yet approved or cleared by the [place]  FDA and has been authorized for detection and/or diagnosis of SARS-CoV-2  by FDA under an Emergency Use Authorization (EUA). This EUA will remain in effect (meaning this test can be used) for the duration of the COVID-19 declaration under Section 564(b)(1) of the Act, 21 U.S.C. section 360bbb-3(b)(1), unless the authorization is terminated or revoked.     Resp Syncytial Virus by PCR NEGATIVE NEGATIVE Final    Comment: (NOTE) Fact Sheet for Patients: BloggerCourse.com  Fact Sheet for Healthcare Providers: SeriousBroker.it  This test is not yet approved or cleared by the [place]  FDA and has been authorized for detection and/or diagnosis of SARS-CoV-2 by FDA under an Emergency Use Authorization (EUA). This EUA will remain in effect (meaning this test can be used) for the duration of the COVID-19 declaration under Section 564(b)(1) of the Act, 21  U.S.C. section 360bbb-3(b)(1), unless the authorization is terminated or revoked.  Performed at [facility], [address] [place], [place] [address]   Culture, blood (routine x 2)     Status: None (Preliminary result)   Collection Time: 10/17/23 12:07 AM   Specimen: BLOOD RIGHT ARM  Result Value Ref Range Status   Specimen Description BLOOD RIGHT ARM  Final   Special Requests   Final    BOTTLES DRAWN AEROBIC AND ANAEROBIC Blood Culture adequate volume   Culture   Final    NO GROWTH 4 DAYS Performed at [facility], [address] [place], [place] [address]    Report Status PENDING  Incomplete  MRSA Next Gen by PCR, Nasal     Status: None   Collection Time: 10/17/23  9:08 PM   Specimen: Nasal Mucosa; Nasal Swab  Result Value Ref Range Status   MRSA by PCR Next Gen NOT DETECTED NOT DETECTED Final    Comment: (NOTE) The GeneXpert MRSA Assay (FDA approved for NASAL specimens only), is one component of a comprehensive MRSA colonization surveillance program. It is not intended to diagnose MRSA infection nor to guide or monitor treatment for MRSA infections. Test performance is not FDA approved in patients less than 53 years old. Performed at [facility], [address] [place], [place] [address]   Culture, blood (Routine X 2) w Reflex to ID Panel     Status: None (Preliminary result)   Collection Time: 10/19/23  8:59 AM   Specimen: BLOOD LEFT HAND  Result Value Ref Range Status   Specimen Description BLOOD LEFT HAND  Final   Special Requests   Final    BOTTLES DRAWN AEROBIC AND ANAEROBIC Blood Culture adequate volume   Culture   Final    NO GROWTH 2 DAYS Performed at [facility], [address] [place], [place] [address]    Report Status PENDING  Incomplete  Culture, blood (Routine X 2) w Reflex to ID Panel     Status: None (Preliminary result)   Collection Time: 10/19/23  9:06 AM   Specimen: BLOOD RIGHT HAND  Result Value Ref  Range Status   Specimen Description BLOOD RIGHT HAND  Final   Special Requests   Final    BOTTLES DRAWN AEROBIC AND ANAEROBIC Blood Culture adequate volume   Culture   Final    NO GROWTH 2 DAYS Performed at [facility], [address], [place], [place] [address]    Report Status PENDING  Incomplete  Expectorated Sputum Assessment w Gram Stain, Rflx to Resp Cult     Status: None   Collection Time: 10/19/23 10:20 PM   Specimen: Expectorated Sputum  Result Value Ref Range Status  Specimen Description EXPECTORATED SPUTUM  Final   Special Requests NONE  Final   Sputum evaluation   Final    THIS SPECIMEN IS ACCEPTABLE FOR SPUTUM CULTURE Performed at [facility], [address] [place], [place] [address]    Report Status 10/19/2023 FINAL  Final  Culture, Respiratory w Gram Stain     Status: None (Preliminary result)   Collection Time: 10/19/23 10:20 PM  Result Value Ref Range Status   Specimen Description   Final    EXPECTORATED SPUTUM Performed at [facility], [address] [place], [place] [address]    Special Requests   Final    NONE Reflexed from [phone] Performed at [facility], [address] [place], [place] [address]    Gram Stain   Final    RARE WBC PRESENT, PREDOMINANTLY PMN MODERATE GRAM POSITIVE COCCI MODERATE GRAM NEGATIVE RODS    Culture   Final    CULTURE REINCUBATED FOR BETTER GROWTH Performed at [facility], [address], [place], [place] [address]    Report Status PENDING  Incomplete      Radiology Studies last 3 days: US  EKG SITE RITE Result Date: 10/21/2023 If Site Rite image not attached, placement could not be confirmed due to current cardiac rhythm.          Lynanne Delgreco, DO [facility] 10/21/2023, 4:48 PM    Dictation software may have been used to generate the above note. Typos may occur and escape review in typed/dictated notes. Please contact Dr Authur Leghorn directly for  clarity if needed.  Staff may message me via secure chat in Epic  but this may not receive an immediate response,  please page me for urgent matters!  If 7PM-7AM, please contact night coverage [email or web address]

## 2023-10-21 NOTE — Plan of Care (Signed)
  Problem: Education: Goal: Knowledge of General Education information will improve Description: Including pain rating scale, medication(s)/side effects and non-pharmacologic comfort measures Outcome: Progressing   Problem: Health Behavior/Discharge Planning: Goal: Ability to manage health-related needs will improve Outcome: Progressing   Problem: Clinical Measurements: Goal: Diagnostic test results will improve Outcome: Progressing Goal: Respiratory complications will improve Outcome: Progressing Goal: Cardiovascular complication will be avoided Outcome: Progressing   Problem: Activity: Goal: Risk for activity intolerance will decrease Outcome: Progressing   Problem: Nutrition: Goal: Adequate nutrition will be maintained Outcome: Progressing   Problem: Coping: Goal: Level of anxiety will decrease Outcome: Progressing   Problem: Elimination: Goal: Will not experience complications related to bowel motility Outcome: Progressing Goal: Will not experience complications related to urinary retention Outcome: Progressing

## 2023-10-21 NOTE — Progress Notes (Signed)
 Mobility Specialist - Progress Note   10/21/23 0932  Mobility  Activity Stood at bedside;Dangled on edge of bed;Transferred from bed to chair  Level of Assistance Contact guard assist, steadying assist  Assistive Device Front wheel walker  Distance Ambulated (ft) 5 ft  Activity Response Tolerated well  Mobility Referral Yes  Mobility visit 1 Mobility  Mobility Specialist Start Time (ACUTE ONLY) 0901  Mobility Specialist Stop Time (ACUTE ONLY) 0916  Mobility Specialist Time Calculation (min) (ACUTE ONLY) 15 min   Pt supine asleep on 2L upon arrival. Pt awakens to light touch and agreeable to transfer to recliner for breakfast. Pt completes bed mobility and able to maintain balance at EOB for an extended period of time SBA. Pt STS and ambulates to recliner CGA. Pt left in recliner with needs in reach and chair alarm activated.   Wash Hack  Mobility Specialist  10/21/23 9:34 AM

## 2023-10-21 NOTE — Progress Notes (Signed)
 Assessed bilateral arms for Midline placement. No suitable veins found to attempt.Veins are tiny and some will not compress. Patient has difficulty extending right arm out without pain to assess veins thoroughly due to history of right shoulder surgery. Nurse and MD notified

## 2023-10-21 NOTE — TOC Progression Note (Signed)
 Transition of Care [facility]) - Progression Note    Patient Details  Name: [name] MRN: [id number] Date of Birth: 19-Feb-1941  Transition of Care [facility]) CM/SW Contact  [place] L Sterling [place], [place] Phone Number: 10/21/2023, 12:49 PM  Clinical Narrative:     CSW spoke with Marcina Severe [phone] to confirm that bedside teach will be completed at the home after patient is discharged. CSW confirmed that MD ordered a PICC line for patient. Expected discharge is noon tomorrow.   Expected Discharge Plan: OP Rehab Barriers to Discharge: Continued Medical Work up  Expected Discharge Plan and Services       Living arrangements for the past 2 months: Single Family Home                                       Social Determinants of Health (SDOH) Interventions SDOH Screenings   Food Insecurity: No Food Insecurity (10/16/2023)  Housing: Low Risk  (10/16/2023)  Transportation Needs: No Transportation Needs (10/16/2023)  Utilities: Not At Risk (10/16/2023)  Alcohol Screen: Low Risk  (07/12/2021)  Depression (PHQ2-9): Low Risk  (04/04/2023)  Financial Resource Strain: Low Risk  (07/12/2021)  Physical Activity: Inactive (07/12/2021)  Social Connections: Socially Integrated (10/16/2023)  Stress: No Stress Concern Present (07/12/2021)  Tobacco Use: Medium Risk (10/16/2023)  Health Literacy: Medium Risk (06/09/2023)   Received from [facility]    Readmission Risk Interventions    10/17/2023    3:18 PM 04/21/2023   10:16 AM  Readmission Risk Prevention Plan  Transportation Screening Complete Complete  HRI or Home Care Consult  Complete  Social Work Consult for Recovery Care Planning/Counseling  Complete  Palliative Care Screening  Not Applicable  Medication Review Oceanographer) Complete Complete  PCP or Specialist appointment within 3-5 days of discharge Complete   SW Recovery Care/Counseling Consult Complete   Palliative Care Screening Not Applicable   Skilled Nursing Facility Not  Applicable

## 2023-10-21 NOTE — TOC Progression Note (Signed)
 Transition of Care [facility]) - Progression Note    Patient Details  Name: [name] MRN: [id number] Date of Birth: [birth year]  Transition of Care [facility]) CM/SW Contact  [place] L Tallie [place], [place] Phone Number: 10/21/2023, 1:21 PM  Clinical Narrative:     CSW confirmed with Chanetta Comes, that bedside teach will occur 05/18. She advised that the nephew, [name], is an EMT and she went over a few things with him virtually but the plan is for [name] to come to the home and complete teach with the family. The medication will be delivered tonight to the home. The nephew is an EMT per Ms. Chandler.    Expected Discharge Plan: OP Rehab Barriers to Discharge: Continued Medical Work up  Expected Discharge Plan and Services       Living arrangements for the past 2 months: Single Family Home                                       Social Determinants of Health (SDOH) Interventions SDOH Screenings   Food Insecurity: No Food Insecurity (10/16/2023)  Housing: Low Risk  (10/16/2023)  Transportation Needs: No Transportation Needs (10/16/2023)  Utilities: Not At Risk (10/16/2023)  Alcohol Screen: Low Risk  (07/12/2021)  Depression (PHQ2-9): Low Risk  (04/04/2023)  Financial Resource Strain: Low Risk  (07/12/2021)  Physical Activity: Inactive (07/12/2021)  Social Connections: Socially Integrated (10/16/2023)  Stress: No Stress Concern Present (07/12/2021)  Tobacco Use: Medium Risk (10/16/2023)  Health Literacy: Medium Risk (06/09/2023)   Received from [facility]    Readmission Risk Interventions    10/17/2023    3:18 PM 04/21/2023   10:16 AM  Readmission Risk Prevention Plan  Transportation Screening Complete Complete  HRI or Home Care Consult  Complete  Social Work Consult for Recovery Care Planning/Counseling  Complete  Palliative Care Screening  Not Applicable  Medication Review Oceanographer) Complete Complete  PCP or Specialist appointment within 3-5 days of discharge  Complete   SW Recovery Care/Counseling Consult Complete   Palliative Care Screening Not Applicable   Skilled Nursing Facility Not Applicable

## 2023-10-21 NOTE — Plan of Care (Signed)
  Problem: Education: Goal: Knowledge of General Education information will improve Description: Including pain rating scale, medication(s)/side effects and non-pharmacologic comfort measures Outcome: Progressing   Problem: Health Behavior/Discharge Planning: Goal: Ability to manage health-related needs will improve Outcome: Progressing   Problem: Clinical Measurements: Goal: Ability to maintain clinical measurements within normal limits will improve Outcome: Progressing   Problem: Elimination: Goal: Will not experience complications related to urinary retention Outcome: Progressing   Problem: Pain Managment: Goal: General experience of comfort will improve and/or be controlled Outcome: Progressing

## 2023-10-22 ENCOUNTER — Inpatient Hospital Stay

## 2023-10-22 DIAGNOSIS — B965 Pseudomonas (aeruginosa) (mallei) (pseudomallei) as the cause of diseases classified elsewhere: Secondary | ICD-10-CM | POA: Diagnosis not present

## 2023-10-22 DIAGNOSIS — J151 Pneumonia due to Pseudomonas: Secondary | ICD-10-CM | POA: Diagnosis not present

## 2023-10-22 DIAGNOSIS — R7881 Bacteremia: Secondary | ICD-10-CM | POA: Diagnosis not present

## 2023-10-22 LAB — BASIC METABOLIC PANEL WITH GFR
Anion gap: 10 (ref 5–15)
BUN: 55 mg/dL — ABNORMAL HIGH (ref 8–23)
CO2: 21 mmol/L — ABNORMAL LOW (ref 22–32)
Calcium: 7.5 mg/dL — ABNORMAL LOW (ref 8.9–10.3)
Chloride: 104 mmol/L (ref 98–111)
Creatinine, Ser: 3.63 mg/dL — ABNORMAL HIGH (ref 0.61–1.24)
GFR, Estimated: 16 mL/min — ABNORMAL LOW (ref >60)
Glucose, Bld: 99 mg/dL (ref 70–99)
Potassium: 3.7 mmol/L (ref 3.5–5.1)
Sodium: 135 mmol/L (ref 135–145)

## 2023-10-22 LAB — CULTURE, RESPIRATORY W GRAM STAIN

## 2023-10-22 LAB — CBC
HCT: 24.6 % — ABNORMAL LOW (ref 39.0–52.0)
Hemoglobin: 8 g/dL — ABNORMAL LOW (ref 13.0–17.0)
MCH: 29.1 pg (ref 26.0–34.0)
MCHC: 32.5 g/dL (ref 30.0–36.0)
MCV: 89.5 fL (ref 80.0–100.0)
Platelets: 118 10*3/uL — ABNORMAL LOW (ref 150–400)
RBC: 2.75 MIL/uL — ABNORMAL LOW (ref 4.22–5.81)
RDW: 17.7 % — ABNORMAL HIGH (ref 11.5–15.5)
WBC: 3.4 10*3/uL — ABNORMAL LOW (ref 4.0–10.5)
nRBC: 0 % (ref 0.0–0.2)

## 2023-10-22 LAB — CULTURE, BLOOD (ROUTINE X 2)
Culture: NO GROWTH
Special Requests: ADEQUATE

## 2023-10-22 LAB — MAGNESIUM: Magnesium: 1.8 mg/dL (ref 1.7–2.4)

## 2023-10-22 NOTE — Plan of Care (Signed)
  Problem: Education: Goal: Knowledge of General Education information will improve Description: Including pain rating scale, medication(s)/side effects and non-pharmacologic comfort measures Outcome: Progressing   Problem: Health Behavior/Discharge Planning: Goal: Ability to manage health-related needs will improve Outcome: Progressing   Problem: Clinical Measurements: Goal: Ability to maintain clinical measurements within normal limits will improve Outcome: Progressing Goal: Respiratory complications will improve Outcome: Progressing Goal: Cardiovascular complication will be avoided Outcome: Progressing   Problem: Nutrition: Goal: Adequate nutrition will be maintained Outcome: Progressing   Problem: Elimination: Goal: Will not experience complications related to urinary retention Outcome: Progressing

## 2023-10-22 NOTE — Plan of Care (Signed)
   Problem: Education: Goal: Knowledge of General Education information will improve Description: Including pain rating scale, medication(s)/side effects and non-pharmacologic comfort measures Outcome: Not Progressing   Problem: Health Behavior/Discharge Planning: Goal: Ability to manage health-related needs will improve Outcome: Not Progressing   Problem: Clinical Measurements: Goal: Ability to maintain clinical measurements within normal limits will improve Outcome: Not Progressing Goal: Will remain free from infection Outcome: Not Progressing Goal: Diagnostic test results will improve Outcome: Not Progressing Goal: Respiratory complications will improve Outcome: Not Progressing Goal: Cardiovascular complication will be avoided Outcome: Not Progressing   Problem: Activity: Goal: Risk for activity intolerance will decrease Outcome: Not Progressing   Problem: Nutrition: Goal: Adequate nutrition will be maintained Outcome: Not Progressing   Problem: Coping: Goal: Level of anxiety will decrease Outcome: Not Progressing   Problem: Elimination: Goal: Will not experience complications related to bowel motility Outcome: Not Progressing Goal: Will not experience complications related to urinary retention Outcome: Not Progressing   Problem: Pain Managment: Goal: General experience of comfort will improve and/or be controlled Outcome: Not Progressing   Problem: Safety: Goal: Ability to remain free from injury will improve Outcome: Not Progressing   Problem: Skin Integrity: Goal: Risk for impaired skin integrity will decrease Outcome: Not Progressing   Problem: Activity: Goal: Ability to tolerate increased activity will improve Outcome: Not Progressing   Problem: Clinical Measurements: Goal: Ability to maintain a body temperature in the normal range will improve Outcome: Not Progressing   Problem: Respiratory: Goal: Ability to maintain adequate ventilation will  improve Outcome: Not Progressing Goal: Ability to maintain a clear airway will improve Outcome: Not Progressing

## 2023-10-22 NOTE — Progress Notes (Addendum)
 PROGRESS NOTE    [name]   ZOX:[id number] DOB: Oct 31, 1940  DOA: 10/16/2023 Date of Service: 10/22/23 which is hospital day 6  PCP: [name], [facility] course / significant events:   HPI: [name] is a 83 year old male with history of traumatic subarachnoid hemorrhage, myelofibrosis, symptomatic anemia, persistent atrial fibrillation, mild cognitive impairment, hypothyroid, GERD, BPH, hyperlipidemia, hypothyroid, who presents emergency department for chief concerns of cough and wheezing.  05/12: admitted to hospitalist for sepsis d/t RLL pneumonia, AKI, hyperkalemia, HFpEF exacerbation. Started abx, diuresis  05/13: diuresing, (+)Bcx Pseudomonas, continue abx, 05/14: Cr slowly improving. Briefly Afib RVR but this improved w/ diltiazem  IV x1 and increased beta blocker, resumed home amiodarone . Per ID, plan repeat BCx following 48h abx, continue cefepime   05/15: HR low overnight, soft BP - reduced beta blocker. Repeat BCx drawn today.  05/16: BCx NG thus far. Planning for home w/ HH and IV abx to continue w/ cefepime . Per nephrology - ok for midline or PICC despite CrCl 05/17: orders in for PICC, TOC working on [facility] (should be able to start tomorrow) and IV abx, DME  05/18: unable to place PICC, will need to involve IR tomorrow for PICC/midline or other access prior to dc home on IV abx. ESBL (+)Sputum culture, per ID will repeat CXR       Consultants:  Infectious disease  Palliative care   Procedures/Surgeries: none      ASSESSMENT & PLAN:   Severe sepsis with acute organ dysfunction - improved  Patient has increased respiration rate, leukopenia, and source of infection is right lower lobe pneumonia and bacteremia Treat underlying cause(s) below    Acute hypoxemic respiratory failure - resolved  Due to PNA and CHF.   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 monitor BMP Per nephrology - ok for midline or PICC despite CrCl   Hyperkalemia - resolved  In setting of acute kidney injury Monitor BMP   Hypomagnesemia monitor and supplement PRN   Weakness Pt declining SNF rehab   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 Palliative care following   Diminished decision-making capacity but not absent  Pt cannot repeat back to me what he is sick with (pneumonia and blood infection from that, CHF), and plan (recs for rehab, will need IV abx) he just keeps saying he does not want to go to rehab and wants to go home when cleared for discharge In my judgment, he has a basic understanding of his medical situation and he can express his overall goals, but he may not grasp complicated concepts very well    N/a based on BMI: There is no  height or weight on file to calculate BMI.. Significantly low or high BMI is associated with higher medical risk.  Underweight - under 18  overweight - 25 to 29 obese - 30 or more Class 1 obesity: BMI of 30.0 to 34 Class 2 obesity: BMI of 35.0 to 39 Class 3 obesity: BMI of 40.0 to 49 Super Morbid Obesity: BMI 50-59 Super-super Morbid Obesity: BMI 60+ Healthy  nutrition and physical activity advised as adjunct to other disease management and risk reduction treatments    DVT prophylaxis: no anticoagulation d/t SAH IV fluids: no continuous IV fluids  Nutrition: cardiac diet  Central lines / other devices: none  Code Status: FULL CODE - see palliative note 10/18/23  ACP documentation reviewed: none on file in [facility]  TOC needs: HH / IV abx. Will need to arrange iV abx outpatient and DME Medical barriers to dispo: once get central IV access, will be medically ready - unable to place PICC, will need line w/ IR on Mon              Subjective / Brief ROS:  Patient has no concerns today  No CP/SOB Pain controlled.  Denies new weakness.  Tolerating diet.  Reports no concerns w/ urination/defecation.   Family Communication: call to wife yesterday to update on plan, no updates today will call if needed/requested but pt decliend for now     Objective Findings:  Vitals:   10/22/23 0340 10/22/23 0729 10/22/23 1118 10/22/23 1512  BP: 90/66 110/72 107/78 115/80  Pulse: 62 (!) 114 (!) 106 (!) 108  Resp: 19 18 19 18   Temp: 97.7 F (36.5 C) (!) 97.3 F (36.3 C) 97.6 F (36.4 C) 97.6 F (36.4 C)  TempSrc: Oral Oral Oral Oral  SpO2: 98% 96% 97% 98%    Intake/Output Summary (Last 24 hours) at 10/22/2023 1554 Last data filed at 10/22/2023 1032 Gross per 24 hour  Intake 660 ml  Output 1625 ml  Net -965 ml   There were no vitals filed for this visit.  Examination:  Physical Exam Constitutional:      General: He is not in acute distress. Cardiovascular:     Rate and Rhythm: Normal rate and regular rhythm.  Pulmonary:     Effort: Pulmonary effort is normal. No tachypnea or respiratory distress.     Breath sounds: No decreased breath sounds, wheezing, rhonchi or rales.  Musculoskeletal:     Right lower leg: No edema.     Left lower leg: No edema.  Neurological:     General: No focal deficit present.     Mental Status: He is  alert and oriented to person, place, and time.  Psychiatric:        Mood and Affect: Mood normal.        Behavior: Behavior normal.          Scheduled Medications:   amiodarone   200 mg Oral Daily   cyanocobalamin   1,000 mcg Oral Daily   DULoxetine   20 mg Oral QHS   furosemide   40 mg Oral BID   levothyroxine   100 mcg Oral Q0600   metoprolol  tartrate  25 mg Oral BID   pantoprazole   40 mg Oral Daily   simvastatin   40 mg Oral QHS    Continuous Infusions:  ceFEPime  (MAXIPIME ) IV Stopped (10/22/23 0927)    PRN Medications:  acetaminophen  **OR** acetaminophen , diphenoxylate -atropine , guaiFENesin -dextromethorphan , ipratropium-albuterol , ondansetron  **OR** ondansetron  (ZOFRAN ) IV, QUEtiapine , senna-docusate, traMADol   Antimicrobials from admission:  Anti-infectives (From admission, onward)  Start     Dose/Rate Route Frequency Ordered Stop   10/20/23 0000  ceFEPime  (MAXIPIME ) IVPB  Status:  Discontinued        2 g Intravenous Every 24 hours 10/20/23 1533 10/20/23    10/20/23 0000  ceFEPime  (MAXIPIME ) IVPB        1 g Intravenous Every 12 hours 10/20/23 1534 10/30/23 2359   10/18/23 1000  ceFEPIme  (MAXIPIME ) 1 g in sodium chloride  0.9 % 100 mL IVPB        1 g 200 mL/hr over 30 Minutes Intravenous Every 12 hours 10/18/23 0831     10/17/23 1200  ceFEPIme  (MAXIPIME ) 2 g in sodium chloride  0.9 % 100 mL IVPB  Status:  Discontinued        2 g 200 mL/hr over 30 Minutes Intravenous Every 24 hours 10/16/23 1338 10/18/23 0831   10/17/23 1000  azithromycin  (ZITHROMAX ) 500 mg in sodium chloride  0.9 % 250 mL IVPB  Status:  Discontinued        500 mg 250 mL/hr over 60 Minutes Intravenous Every 24 hours 10/16/23 1307 10/17/23 0836   10/17/23 0800  vancomycin  variable dose per unstable renal function (pharmacist dosing)  Status:  Discontinued         Does not apply See admin instructions 10/16/23 1344 10/17/23 0836   10/16/23 1330  vancomycin  (VANCOREADY) IVPB 1500 mg/300 mL        1,500  mg 150 mL/hr over 120 Minutes Intravenous  Once 10/16/23 1321 10/16/23 1706   10/16/23 1300  vancomycin  (VANCOCIN ) IVPB 1000 mg/200 mL premix  Status:  Discontinued        1,000 mg 200 mL/hr over 60 Minutes Intravenous  Once 10/16/23 1252 10/16/23 1321   10/16/23 1300  ceFEPIme  (MAXIPIME ) 2 g in sodium chloride  0.9 % 100 mL IVPB        2 g 200 mL/hr over 30 Minutes Intravenous  Once 10/16/23 1252 10/16/23 1341   10/16/23 1300  azithromycin  (ZITHROMAX ) 500 mg in sodium chloride  0.9 % 250 mL IVPB        500 mg 250 mL/hr over 60 Minutes Intravenous  Once 10/16/23 1252 10/16/23 1518           Data Reviewed:  I have personally reviewed the following...  CBC: Recent Labs  Lab 10/16/23 1028 10/17/23 0550 10/18/23 0202 10/18/23 1831 10/19/23 0358 10/20/23 0447 10/21/23 0452 10/22/23 0337  WBC 2.0*   < > 2.0*  --  3.3* 3.0* 3.2* 3.4*  NEUTROABS 1.5*  --   --   --   --   --   --   --   HGB 8.0*   < > 7.1* 9.3* 8.2* 8.2* 7.8* 8.0*  HCT 25.8*   < > 22.5* 28.6* 26.0* 25.0* 24.5* 24.6*  MCV 93.1   < > 88.6  --  90.0 89.6 91.1 89.5  PLT 76*   < > 70*  --  79* 78* 94* 118*   < > = values in this interval not displayed.   Basic Metabolic Panel: Recent Labs  Lab 10/18/23 0202 10/19/23 0358 10/20/23 0447 10/21/23 0452 10/22/23 0337  NA 133* 135 137 135 135  K 4.5 4.1 3.7 3.7 3.7  CL 106 104 105 105 104  CO2 19* 22 22 21* 21*  GLUCOSE 96 111* 96 92 99  BUN 57* 55* 56* 56* 55*  CREATININE 3.45* 3.43* 3.48* 3.44* 3.63*  CALCIUM  6.9* 7.3* 7.6* 7.4* 7.5*  MG 2.1 2.0 1.8 1.8 1.8  GFR: Estimated Creatinine Clearance: 16.3 mL/min (A) (by C-G formula based on SCr of 3.63 mg/dL (H)). Liver Function Tests: Recent Labs  Lab 10/16/23 1028 10/20/23 0447  AST 24 13*  ALT 17 16  ALKPHOS 65 60  BILITOT 1.7* 1.2  PROT 5.1* 4.7*  ALBUMIN  2.3* 2.0*   No results for input(s): "LIPASE", "AMYLASE" in the last 168 hours. No results for input(s): "AMMONIA" in the last 168  hours. Coagulation Profile: No results for input(s): "INR", "PROTIME" in the last 168 hours. Cardiac Enzymes: Recent Labs  Lab 10/16/23 1031  CKTOTAL 77   BNP (last 3 results) No results for input(s): "PROBNP" in the last 8760 hours. HbA1C: No results for input(s): "HGBA1C" in the last 72 hours. CBG: No results for input(s): "GLUCAP" in the last 168 hours. Lipid Profile: No results for input(s): "CHOL", "HDL", "LDLCALC", "TRIG", "CHOLHDL", "LDLDIRECT" in the last 72 hours. Thyroid  Function Tests: No results for input(s): "TSH", "T4TOTAL", "FREET4", "T3FREE", "THYROIDAB" in the last 72 hours. Anemia Panel: No results for input(s): "VITAMINB12", "FOLATE", "FERRITIN", "TIBC", "IRON", "RETICCTPCT" in the last 72 hours. Most Recent Urinalysis On File:     Component Value Date/Time   COLORURINE YELLOW 09/21/2023 1814   APPEARANCEUR CLEAR 09/21/2023 1814   APPEARANCEUR Clear 08/04/2023 1507   LABSPEC 1.019 09/21/2023 1814   PHURINE 5.0 09/21/2023 1814   GLUCOSEU NEGATIVE 09/21/2023 1814   HGBUR NEGATIVE 09/21/2023 1814   BILIRUBINUR NEGATIVE 09/21/2023 1814   BILIRUBINUR Negative 08/04/2023 1507   KETONESUR NEGATIVE 09/21/2023 1814   PROTEINUR 100 (A) 09/21/2023 1814   NITRITE NEGATIVE 09/21/2023 1814   LEUKOCYTESUR NEGATIVE 09/21/2023 1814   Sepsis Labs: @LABRCNTIP (procalcitonin:4,lacticidven:4) Microbiology: Recent Results (from the past 240 hours)  Culture, blood (routine x 2)     Status: Abnormal   Collection Time: 10/16/23 10:32 AM   Specimen: BLOOD  Result Value Ref Range Status   Specimen Description   Final    BLOOD RIGHT ANTECUBITAL Performed at [facility], [address], [place], [place] [address]    Special Requests   Final    BOTTLES DRAWN AEROBIC AND ANAEROBIC Blood Culture adequate volume Performed at [facility], [address], [place], [place] [address]    Culture  Setup Time   Final    GRAM NEGATIVE RODS AEROBIC BOTTLE  ONLY CRITICAL RESULT CALLED TO, READ BACK BY AND VERIFIED WITHNoe Bath AT 0981 10/17/23 JG Performed at [facility], [address], [place], [place] [address]    Culture PSEUDOMONAS AERUGINOSA (A)  Final   Report Status 10/19/2023 FINAL  Final   Organism ID, Bacteria PSEUDOMONAS AERUGINOSA  Final      Susceptibility   Pseudomonas aeruginosa - MIC*    CEFTAZIDIME 2 SENSITIVE Sensitive     CIPROFLOXACIN  0.5 SENSITIVE Sensitive     GENTAMICIN  <=1 SENSITIVE Sensitive     IMIPENEM 2 SENSITIVE Sensitive     PIP/TAZO 8 SENSITIVE Sensitive ug/mL    CEFEPIME  2 SENSITIVE Sensitive     * PSEUDOMONAS AERUGINOSA  Blood Culture ID Panel (Reflexed)     Status: Abnormal   Collection Time: 10/16/23 10:32 AM  Result Value Ref Range Status   Enterococcus faecalis NOT DETECTED NOT DETECTED Final   Enterococcus Faecium NOT DETECTED NOT DETECTED Final   Listeria monocytogenes NOT DETECTED NOT DETECTED Final   Staphylococcus species NOT DETECTED NOT DETECTED Final   Staphylococcus aureus (BCID) NOT DETECTED NOT DETECTED Final   Staphylococcus epidermidis NOT DETECTED NOT DETECTED  Final   Staphylococcus lugdunensis NOT DETECTED NOT DETECTED Final   Streptococcus species NOT DETECTED NOT DETECTED Final   Streptococcus agalactiae NOT DETECTED NOT DETECTED Final   Streptococcus pneumoniae NOT DETECTED NOT DETECTED Final   Streptococcus pyogenes NOT DETECTED NOT DETECTED Final   A.calcoaceticus-baumannii NOT DETECTED NOT DETECTED Final   Bacteroides fragilis NOT DETECTED NOT DETECTED Final   Enterobacterales NOT DETECTED NOT DETECTED Final   Enterobacter cloacae complex NOT DETECTED NOT DETECTED Final   Escherichia coli NOT DETECTED NOT DETECTED Final   Klebsiella aerogenes NOT DETECTED NOT DETECTED Final   Klebsiella oxytoca NOT DETECTED NOT DETECTED Final   Klebsiella pneumoniae NOT DETECTED NOT DETECTED Final   Proteus species NOT DETECTED NOT DETECTED Final   Salmonella species NOT  DETECTED NOT DETECTED Final   Serratia marcescens NOT DETECTED NOT DETECTED Final   Haemophilus influenzae NOT DETECTED NOT DETECTED Final   Neisseria meningitidis NOT DETECTED NOT DETECTED Final   Pseudomonas aeruginosa DETECTED (A) NOT DETECTED Final    Comment: CRITICAL RESULT CALLED TO, READ BACK BY AND VERIFIED WITH:  NATHAN BELUE AT 5366 10/17/23 JG    Stenotrophomonas maltophilia NOT DETECTED NOT DETECTED Final   Candida albicans NOT DETECTED NOT DETECTED Final   Candida auris NOT DETECTED NOT DETECTED Final   Candida glabrata NOT DETECTED NOT DETECTED Final   Candida krusei NOT DETECTED NOT DETECTED Final   Candida parapsilosis NOT DETECTED NOT DETECTED Final   Candida tropicalis NOT DETECTED NOT DETECTED Final   Cryptococcus neoformans/gattii NOT DETECTED NOT DETECTED Final   CTX-M ESBL NOT DETECTED NOT DETECTED Final   Carbapenem resistance IMP NOT DETECTED NOT DETECTED Final   Carbapenem resistance KPC NOT DETECTED NOT DETECTED Final   Carbapenem resistance NDM NOT DETECTED NOT DETECTED Final   Carbapenem resistance VIM NOT DETECTED NOT DETECTED Final    Comment: Performed at [facility], [address] [place], [place] [address]  Resp panel by RT-PCR (RSV, Flu A&B, Covid) Anterior Nasal Swab     Status: None   Collection Time: 10/16/23  1:04 PM   Specimen: Anterior Nasal Swab  Result Value Ref Range Status   SARS Coronavirus 2 by RT PCR NEGATIVE NEGATIVE Final    Comment: (NOTE) SARS-CoV-2 target nucleic acids are NOT DETECTED.  The SARS-CoV-2 RNA is generally detectable in upper respiratory specimens during the acute phase of infection. The lowest concentration of SARS-CoV-2 viral copies this assay can detect is 138 copies/mL. A negative result does not preclude SARS-Cov-2 infection and should not be used as the sole basis for treatment or other patient management decisions. A negative result may occur with  improper specimen collection/handling, submission  of specimen other than nasopharyngeal swab, presence of viral mutation(s) within the areas targeted by this assay, and inadequate number of viral copies(<138 copies/mL). A negative result must be combined with clinical observations, patient history, and epidemiological information. The expected result is Negative.  Fact Sheet for Patients:  BloggerCourse.com  Fact Sheet for Healthcare Providers:  SeriousBroker.it  This test is no t yet approved or cleared by the [place]  FDA and  has been authorized for detection and/or diagnosis of SARS-CoV-2 by FDA under an Emergency Use Authorization (EUA). This EUA will remain  in effect (meaning this test can be used) for the duration of the COVID-19 declaration under Section 564(b)(1) of the Act, 21 U.S.C.section 360bbb-3(b)(1), unless the authorization is terminated  or revoked sooner.       Influenza A by PCR NEGATIVE  NEGATIVE Final   Influenza B by PCR NEGATIVE NEGATIVE Final    Comment: (NOTE) The Xpert Xpress SARS-CoV-2/FLU/RSV plus assay is intended as an aid in the diagnosis of influenza from Nasopharyngeal swab specimens and should not be used as a sole basis for treatment. Nasal washings and aspirates are unacceptable for Xpert Xpress SARS-CoV-2/FLU/RSV testing.  Fact Sheet for Patients: BloggerCourse.com  Fact Sheet for Healthcare Providers: SeriousBroker.it  This test is not yet approved or cleared by the [place]  FDA and has been authorized for detection and/or diagnosis of SARS-CoV-2 by FDA under an Emergency Use Authorization (EUA). This EUA will remain in effect (meaning this test can be used) for the duration of the COVID-19 declaration under Section 564(b)(1) of the Act, 21 U.S.C. section 360bbb-3(b)(1), unless the authorization is terminated or revoked.     Resp Syncytial Virus by PCR NEGATIVE NEGATIVE  Final    Comment: (NOTE) Fact Sheet for Patients: BloggerCourse.com  Fact Sheet for Healthcare Providers: SeriousBroker.it  This test is not yet approved or cleared by the [place]  FDA and has been authorized for detection and/or diagnosis of SARS-CoV-2 by FDA under an Emergency Use Authorization (EUA). This EUA will remain in effect (meaning this test can be used) for the duration of the COVID-19 declaration under Section 564(b)(1) of the Act, 21 U.S.C. section 360bbb-3(b)(1), unless the authorization is terminated or revoked.  Performed at [facility], [address] [place], [place] [address]   Culture, blood (routine x 2)     Status: None   Collection Time: 10/17/23 12:07 AM   Specimen: BLOOD RIGHT ARM  Result Value Ref Range Status   Specimen Description BLOOD RIGHT ARM  Final   Special Requests   Final    BOTTLES DRAWN AEROBIC AND ANAEROBIC Blood Culture adequate volume   Culture   Final    NO GROWTH 5 DAYS Performed at [facility], [address], [place], [place] [address]    Report Status 10/22/2023 FINAL  Final  MRSA Next Gen by PCR, Nasal     Status: None   Collection Time: 10/17/23  9:08 PM   Specimen: Nasal Mucosa; Nasal Swab  Result Value Ref Range Status   MRSA by PCR Next Gen NOT DETECTED NOT DETECTED Final    Comment: (NOTE) The GeneXpert MRSA Assay (FDA approved for NASAL specimens only), is one component of a comprehensive MRSA colonization surveillance program. It is not intended to diagnose MRSA infection nor to guide or monitor treatment for MRSA infections. Test performance is not FDA approved in patients less than 59 years old. Performed at [facility], [address] [place], [place] [address]   Culture, blood (Routine X 2) w Reflex to ID Panel     Status: None (Preliminary result)   Collection Time: 10/19/23  8:59 AM   Specimen: BLOOD LEFT HAND  Result  Value Ref Range Status   Specimen Description BLOOD LEFT HAND  Final   Special Requests   Final    BOTTLES DRAWN AEROBIC AND ANAEROBIC Blood Culture adequate volume   Culture   Final    NO GROWTH 3 DAYS Performed at [facility], [address], [place], [place] [address]    Report Status PENDING  Incomplete  Culture, blood (Routine X 2) w Reflex to ID Panel     Status: None (Preliminary result)   Collection Time: 10/19/23  9:06 AM   Specimen: BLOOD RIGHT HAND  Result Value Ref Range Status  Specimen Description BLOOD RIGHT HAND  Final   Special Requests   Final    BOTTLES DRAWN AEROBIC AND ANAEROBIC Blood Culture adequate volume   Culture   Final    NO GROWTH 3 DAYS Performed at [facility], [address] [place], [place] [address]    Report Status PENDING  Incomplete  Expectorated Sputum Assessment w Gram Stain, Rflx to Resp Cult     Status: None   Collection Time: 10/19/23 10:20 PM   Specimen: Expectorated Sputum  Result Value Ref Range Status   Specimen Description EXPECTORATED SPUTUM  Final   Special Requests NONE  Final   Sputum evaluation   Final    THIS SPECIMEN IS ACCEPTABLE FOR SPUTUM CULTURE Performed at [facility], [address] [place], [place] [address]    Report Status 10/19/2023 FINAL  Final  Culture, Respiratory w Gram Stain     Status: None   Collection Time: 10/19/23 10:20 PM  Result Value Ref Range Status   Specimen Description   Final    EXPECTORATED SPUTUM Performed at [facility], [address] [place], [place] [address]    Special Requests   Final    NONE Reflexed from [id number] Performed at [facility], [address] [place], [place] [address]    Gram Stain   Final    RARE WBC PRESENT, PREDOMINANTLY PMN MODERATE GRAM POSITIVE COCCI MODERATE GRAM NEGATIVE RODS Performed at [facility], [address] [place], [place] [address]    Culture   Final    FEW ESCHERICHIA  COLI Confirmed Extended Spectrum Beta-Lactamase Producer (ESBL).  In bloodstream infections from ESBL organisms, carbapenems are preferred over piperacillin /tazobactam. They are shown to have a lower risk of mortality.    Report Status 10/22/2023 FINAL  Final   Organism ID, Bacteria ESCHERICHIA COLI  Final      Susceptibility   Escherichia coli - MIC*    AMPICILLIN >=32 RESISTANT Resistant     CEFEPIME  4 INTERMEDIATE Intermediate     CEFTAZIDIME RESISTANT Resistant     CEFTRIAXONE >=64 RESISTANT Resistant     CIPROFLOXACIN  >=4 RESISTANT Resistant     GENTAMICIN  <=1 SENSITIVE Sensitive     IMIPENEM <=0.25 SENSITIVE Sensitive     TRIMETH /SULFA  <=20 SENSITIVE Sensitive     AMPICILLIN/SULBACTAM 4 SENSITIVE Sensitive     PIP/TAZO <=4 SENSITIVE Sensitive ug/mL    * FEW ESCHERICHIA COLI      Radiology Studies last 3 days: US  EKG SITE RITE Result Date: 10/21/2023 If Site Rite image not attached, placement could not be confirmed due to current cardiac rhythm.          Kanylah Muench, DO [facility] 10/22/2023, 3:54 PM    Dictation software may have been used to generate the above note. Typos may occur and escape review in typed/dictated notes. Please contact Dr Authur Leghorn directly for clarity if needed.  Staff may message me via secure chat in Epic  but this may not receive an immediate response,  please page me for urgent matters!  If 7PM-7AM, please contact night coverage [email or web address]

## 2023-10-22 NOTE — Progress Notes (Signed)
 Physical Therapy Treatment Patient Details Name: [name] MRN: [id number] DOB: 09-29-1940 Today's Date: 10/22/2023   History of Present Illness 83 yo M hosptialized in April after falling at home. CT showed small acute subarachnoid hemorrhage, MRI of L shoulder noted Supraspinatus and infraspinatus tendinosis with partial-thickness tear. No here with sepsis, SOB, tachycardia. PMH Covid, HLD, HTN, hypothyroidism, myelofibrosis, skin CA, CVA, back surgery    PT Comments  Pt needing encouragement but does agree to session.  To EOB with min  a x 1 and bed features.  Stands and walks 50' x 1 and 60' x 1 with RW and cga/min a x 1 due to general weakness and imbalances but overall improved from Friday's session.  Supportive grandson in room who endorses all equipment available at home and +24 hour assist.     If plan is discharge home, recommend the following: Assist for transportation;Supervision due to cognitive status;Help with stairs or ramp for entrance;Assistance with cooking/housework;A little help with walking and/or transfers   Can travel by private vehicle        Equipment Recommendations  Rolling walker (2 wheels);BSC/3in1;Wheelchair cushion (measurements PT);Wheelchair (measurements PT)    Recommendations for Other Services       Precautions / Restrictions Precautions Precautions: Fall Recall of Precautions/Restrictions: Intact Precaution/Restrictions Comments: LUE WBAT Restrictions Weight Bearing Restrictions Per Provider Order: Yes LUE Weight Bearing Per Provider Order: Weight bearing as tolerated     Mobility  Bed Mobility Overal bed mobility: Needs Assistance Bed Mobility: Supine to Sit, Sit to Supine   Sidelying to sit: Contact guard assist, HOB elevated, Used rails Supine to sit: Contact guard, HOB elevated, Used rails       Patient Response: Cooperative  Transfers Overall transfer level: Needs assistance Equipment used: Rolling walker (2  wheels) Transfers: Sit to/from Stand Sit to Stand: Contact guard assist, Min assist                Ambulation/Gait Ambulation/Gait assistance: Min assist, Contact guard assist Gait Distance (Feet): 60 Feet Assistive device: Rolling walker (2 wheels) Gait Pattern/deviations: Step-through pattern Gait velocity: decreased     General Gait Details: 64' 60'   Stairs             Wheelchair Mobility     Tilt Bed Tilt Bed Patient Response: Cooperative  Modified Rankin (Stroke Patients Only)       Balance Overall balance assessment: History of Falls, Needs assistance Sitting-balance support: No upper extremity supported, Feet supported Sitting balance-Leahy Scale: Fair     Standing balance support: Bilateral upper extremity supported Standing balance-Leahy Scale: Fair Standing balance comment: reliant on RW/UEs                            Communication Communication Communication: Impaired Factors Affecting Communication: Hearing impaired  Cognition Arousal: Alert Behavior During Therapy: WFL for tasks assessed/performed   PT - Cognitive impairments: History of cognitive impairments                         Following commands: Intact      Cueing Cueing Techniques: Verbal cues, Gestural cues, Tactile cues  Exercises      General Comments        Pertinent Vitals/Pain Pain Assessment Pain Assessment: No/denies pain Pain Intervention(s): Monitored during session    Home Living  Prior Function            PT Goals (current goals can now be found in the care plan section) Progress towards PT goals: Progressing toward goals    Frequency    Min 1X/week      PT Plan      Co-evaluation              AM-PAC PT "6 Clicks" Mobility   Outcome Measure  Help needed turning from your back to your side while in a flat bed without using bedrails?: None Help needed moving from lying on  your back to sitting on the side of a flat bed without using bedrails?: A Little Help needed moving to and from a bed to a chair (including a wheelchair)?: A Little Help needed standing up from a chair using your arms (e.g., wheelchair or bedside chair)?: A Little Help needed to walk in hospital room?: A Little Help needed climbing 3-5 steps with a railing? : A Little 6 Click Score: 19    End of Session Equipment Utilized During Treatment: Gait belt Activity Tolerance: Patient tolerated treatment well Patient left: in bed;with bed alarm set;with call bell/phone within reach;with family/visitor present Nurse Communication: Mobility status PT Visit Diagnosis: Unsteadiness on feet (R26.81);Muscle weakness (generalized) (M62.81);History of falling (Z91.81);Difficulty in walking, not elsewhere classified (R26.2)     Time: 4098-1191 PT Time Calculation (min) (ACUTE ONLY): 15 min  Charges:    $Gait Training: 8-22 mins PT General Charges $$ ACUTE PT VISIT: 1 Visit                   Charlanne Cong, PTA 10/22/23, 2:39 PM

## 2023-10-23 ENCOUNTER — Other Ambulatory Visit: Payer: Self-pay

## 2023-10-23 ENCOUNTER — Encounter: Admitting: Family Medicine

## 2023-10-23 ENCOUNTER — Inpatient Hospital Stay: Admitting: Radiology

## 2023-10-23 ENCOUNTER — Encounter: Payer: Self-pay | Admitting: Oncology

## 2023-10-23 DIAGNOSIS — D696 Thrombocytopenia, unspecified: Secondary | ICD-10-CM

## 2023-10-23 DIAGNOSIS — R7881 Bacteremia: Secondary | ICD-10-CM | POA: Diagnosis not present

## 2023-10-23 DIAGNOSIS — D72819 Decreased white blood cell count, unspecified: Secondary | ICD-10-CM

## 2023-10-23 DIAGNOSIS — B9620 Unspecified Escherichia coli [E. coli] as the cause of diseases classified elsewhere: Secondary | ICD-10-CM

## 2023-10-23 DIAGNOSIS — Z1612 Extended spectrum beta lactamase (ESBL) resistance: Secondary | ICD-10-CM

## 2023-10-23 DIAGNOSIS — B965 Pseudomonas (aeruginosa) (mallei) (pseudomallei) as the cause of diseases classified elsewhere: Secondary | ICD-10-CM | POA: Diagnosis not present

## 2023-10-23 DIAGNOSIS — J151 Pneumonia due to Pseudomonas: Secondary | ICD-10-CM | POA: Diagnosis not present

## 2023-10-23 HISTORY — PX: IR FLUORO GUIDE CV LINE RIGHT: IMG2283

## 2023-10-23 MED ORDER — SODIUM BICARBONATE 650 MG PO TABS: 650.0000 mg | ORAL_TABLET | Freq: Three times a day (TID) | ORAL | Status: AC

## 2023-10-23 MED ORDER — LIDOCAINE HCL 1 % IJ SOLN
INTRAMUSCULAR | Status: AC
  Filled 2023-10-23: qty 20

## 2023-10-23 MED ORDER — LIDOCAINE HCL 1 % IJ SOLN
15.0000 mL | Freq: Once | INTRAMUSCULAR | Status: AC
  Administered 2023-10-23: 15 mL via INTRADERMAL

## 2023-10-23 MED ORDER — SULFAMETHOXAZOLE-TRIMETHOPRIM 400-80 MG PO TABS
1.0000 | ORAL_TABLET | Freq: Two times a day (BID) | ORAL | 0 refills | Status: AC
  Filled 2023-10-23: qty 8, 4d supply, fill #0

## 2023-10-23 MED ORDER — ONDANSETRON HCL 4 MG PO TABS
4.0000 mg | ORAL_TABLET | Freq: Four times a day (QID) | ORAL | 0 refills | Status: AC | PRN
  Filled 2023-10-23: qty 20, 5d supply, fill #0

## 2023-10-23 MED ORDER — FUROSEMIDE 40 MG PO TABS
40.0000 mg | ORAL_TABLET | Freq: Two times a day (BID) | ORAL | 0 refills | Status: DC
  Filled 2023-10-23: qty 60, 30d supply, fill #0

## 2023-10-23 MED ORDER — METOPROLOL TARTRATE 50 MG PO TABS: 25.0000 mg | ORAL_TABLET | Freq: Two times a day (BID) | ORAL | Status: DC

## 2023-10-23 MED ORDER — DEXTROMETHORPHAN-GUAIFENESIN 20-200 MG/20ML PO LIQD
5.0000 mL | ORAL | 0 refills | Status: DC | PRN
  Filled 2023-10-23: qty 118, 2d supply, fill #0

## 2023-10-23 MED ORDER — SULFAMETHOXAZOLE-TRIMETHOPRIM 400-80 MG PO TABS
1.0000 | ORAL_TABLET | Freq: Two times a day (BID) | ORAL | Status: DC
  Administered 2023-10-23 – 2023-10-24 (×3): 1 via ORAL
  Filled 2023-10-23 (×4): qty 1

## 2023-10-23 NOTE — Progress Notes (Signed)
 Patient returned to unit at this time no s/s of distress noted.  Right internal jugular cath in place dressing clean dry and intact.

## 2023-10-23 NOTE — Procedures (Signed)
 Interventional Radiology Procedure Note  Procedure: Placement of a right IJ approach double lumen tunneled CVC.  Tip is positioned at the superior cavoatrial junction and catheter is ready for immediate use.  Complications: None Recommendations:  - Ok to use - Do not submerge - Routine line care   Signed,  Marciano Settles. Mabel Savage, DO

## 2023-10-23 NOTE — Plan of Care (Signed)
   Problem: Education: Goal: Knowledge of General Education information will improve Description: Including pain rating scale, medication(s)/side effects and non-pharmacologic comfort measures Outcome: Not Progressing   Problem: Health Behavior/Discharge Planning: Goal: Ability to manage health-related needs will improve Outcome: Not Progressing   Problem: Clinical Measurements: Goal: Ability to maintain clinical measurements within normal limits will improve Outcome: Not Progressing Goal: Will remain free from infection Outcome: Not Progressing Goal: Diagnostic test results will improve Outcome: Not Progressing Goal: Respiratory complications will improve Outcome: Not Progressing Goal: Cardiovascular complication will be avoided Outcome: Not Progressing   Problem: Activity: Goal: Risk for activity intolerance will decrease Outcome: Not Progressing   Problem: Nutrition: Goal: Adequate nutrition will be maintained Outcome: Not Progressing   Problem: Coping: Goal: Level of anxiety will decrease Outcome: Not Progressing   Problem: Elimination: Goal: Will not experience complications related to bowel motility Outcome: Not Progressing Goal: Will not experience complications related to urinary retention Outcome: Not Progressing   Problem: Pain Managment: Goal: General experience of comfort will improve and/or be controlled Outcome: Not Progressing   Problem: Safety: Goal: Ability to remain free from injury will improve Outcome: Not Progressing   Problem: Skin Integrity: Goal: Risk for impaired skin integrity will decrease Outcome: Not Progressing   Problem: Activity: Goal: Ability to tolerate increased activity will improve Outcome: Not Progressing   Problem: Clinical Measurements: Goal: Ability to maintain a body temperature in the normal range will improve Outcome: Not Progressing   Problem: Respiratory: Goal: Ability to maintain adequate ventilation will  improve Outcome: Not Progressing Goal: Ability to maintain a clear airway will improve Outcome: Not Progressing

## 2023-10-23 NOTE — TOC Progression Note (Signed)
 Transition of Care [facility]) - Progression Note    Patient Details  Name: [name] MRN: [id number] Date of Birth: 1940/07/16  Transition of Care [facility]) CM/SW Contact  [place] L Kiamesha Samet, [place] Phone Number: 10/23/2023, 12:48 PM  Clinical Narrative:     CSW spoke with [employer]. Lightweight wheelchair ordered and will be delivered today.   Expected Discharge Plan: OP Rehab Barriers to Discharge: Continued Medical Work up  Expected Discharge Plan and Services       Living arrangements for the past 2 months: Single Family Home                                       Social Determinants of Health (SDOH) Interventions SDOH Screenings   Food Insecurity: No Food Insecurity (10/16/2023)  Housing: Low Risk  (10/16/2023)  Transportation Needs: No Transportation Needs (10/16/2023)  Utilities: Not At Risk (10/16/2023)  Alcohol Screen: Low Risk  (07/12/2021)  Depression (PHQ2-9): Low Risk  (04/04/2023)  Financial Resource Strain: Low Risk  (07/12/2021)  Physical Activity: Inactive (07/12/2021)  Social Connections: Socially Integrated (10/16/2023)  Stress: No Stress Concern Present (07/12/2021)  Tobacco Use: Medium Risk (10/16/2023)  Health Literacy: Medium Risk (06/09/2023)   Received from [facility]    Readmission Risk Interventions    10/17/2023    3:18 PM 04/21/2023   10:16 AM  Readmission Risk Prevention Plan  Transportation Screening Complete Complete  HRI or Home Care Consult  Complete  Social Work Consult for Recovery Care Planning/Counseling  Complete  Palliative Care Screening  Not Applicable  Medication Review Oceanographer) Complete Complete  PCP or Specialist appointment within 3-5 days of discharge Complete   SW Recovery Care/Counseling Consult Complete   Palliative Care Screening Not Applicable   Skilled Nursing Facility Not Applicable

## 2023-10-23 NOTE — TOC CM/SW Note (Signed)
    Durable Medical Equipment  (From admission, onward)           Start     Ordered   10/21/23 1232  For home use only DME lightweight manual wheelchair with seat cushion  Once       Comments: Patient suffers from debility, anemia, myelodysplasia, pneumonia, bacteremia which impairs their ability to perform daily activities like bathing, dressing, and toileting in the home.  A cane, crutch, or walker will not resolve  issue with performing activities of daily living. A wheelchair will allow patient to safely perform daily activities. Patient is not able to propel themselves in the home using a standard weight wheelchair due to endurance and general weakness. Patient can self propel in the lightweight wheelchair. Length of need Lifetime. Accessories: elevating leg rests (ELRs), wheel locks, extensions and anti-tippers.   10/21/23 1232   10/21/23 1232  For home use only DME wheelchair cushion (seat and back)  Once        10/21/23 1232   10/21/23 1232  For home use only DME Bedside commode  Once       Question Answer Comment  Patient needs a bedside commode to treat with the following condition Debility   Patient needs a bedside commode to treat with the following condition Pneumonia      10/21/23 1232   10/21/23 1231  For home use only DME Walker rolling  Once       Question Answer Comment  Walker: With 5 Inch Wheels   Patient needs a walker to treat with the following condition Debility   Patient needs a walker to treat with the following condition Pneumonia      10/21/23 1232

## 2023-10-23 NOTE — Progress Notes (Signed)
 Date of Admission:  10/16/2023      ID: [name] is a 83 y.o. male Principal Problem:   Severe sepsis with acute organ dysfunction (HCC) Active Problems:   BPH (benign prostatic hyperplasia)   Symptomatic anemia   Mixed hyperlipidemia   Hypothyroidism   Hyperkalemia   Acute kidney injury (HCC)   CKD (chronic kidney disease) stage 4, GFR 15-29 ml/min (HCC)   History of CVA (cerebrovascular accident)   Traumatic subarachnoid hemorrhage (HCC)   Persistent atrial fibrillation (HCC)   Mild cognitive impairment   Cough   Right lower lobe pneumonia   Weakness   Hypomagnesemia   Palliative care encounter   Bacteremia due to Pseudomonas   Pancytopenia (HCC)  with a history of myelofibrosis, anemia, atrial fibrillation, hypothyroidism, hyperlipidemia, BPH, GERD,CKD, rt shoulder arthroplasty,Was recently in [place] between 09/29/2023 until 10/10/2023 after a fall and sustaining traumatic subarachnoid hemorrhage, recurrent large right pleural effusion for which he underwent thoracentesis and culture was negative, left shoulder supraspinatus and infraspinatus tendinosis with partial-thickness tear presents to [facility] on 10/16/2023 brought in by EMS complaining of shortness of breath and wondering whether he had pneumonia.    Subjective: Pt is in bed-  No specific complaints Had central line placed  Medications:   amiodarone   200 mg Oral Daily   cyanocobalamin   1,000 mcg Oral Daily   DULoxetine   20 mg Oral QHS   furosemide   40 mg Oral BID   levothyroxine   100 mcg Oral Q0600   metoprolol  tartrate  25 mg Oral BID   pantoprazole   40 mg Oral Daily   simvastatin   40 mg Oral QHS   sulfamethoxazole -trimethoprim   1 tablet Oral Q12H    Objective: Vital signs in last 24 hours: Patient Vitals for the past 24 hrs:  BP Temp Temp src Pulse Resp SpO2  10/23/23 1730 135/86 (!) 97.4 F (36.3 C) Oral 95 18 98 %  10/23/23 1116 103/69 97.6 F (36.4 C) Oral 75 17 96 %   10/23/23 0725 118/85 97.9 F (36.6 C) Oral 92 18 98 %  10/23/23 0400 101/70 97.9 F (36.6 C) Oral 90 17 98 %  10/23/23 0015 114/78 (!) 97.4 F (36.3 C) Oral (!) 110 18 98 %  10/22/23 2004 114/66 98.1 F (36.7 C) Oral 81 18 98 %      PHYSICAL EXAM:  General: Alert, cooperative, no distress, pale Lungs: b/l air entry Heart: irregular Abdomen: Soft, non-tender,not distended. Bowel sounds normal. No masses Extremities: atraumatic, no cyanosis. No edema. No clubbing Skin: No rashes or lesions. Or bruising Lymph: Cervical, supraclavicular normal. Neurologic: Grossly non-focal  Lab Results    Latest Ref Rng & Units 10/22/2023    3:37 AM 10/21/2023    4:52 AM 10/20/2023    4:47 AM  CBC  WBC 4.0 - 10.5 K/uL 3.4  3.2  3.0   Hemoglobin 13.0 - 17.0 g/dL 8.0  7.8  8.2   Hematocrit 39.0 - 52.0 % 24.6  24.5  25.0   Platelets 150 - 400 K/uL 118  94  78        Latest Ref Rng & Units 10/22/2023    3:37 AM 10/21/2023    4:52 AM 10/20/2023    4:47 AM  CMP  Glucose 70 - 99 mg/dL 99  92  96   BUN 8 - 23 mg/dL 55  56  56   Creatinine 0.61 - 1.24 mg/dL 1.61  0.96  0.45   Sodium  135 - 145 mmol/L 135  135  137   Potassium 3.5 - 5.1 mmol/L 3.7  3.7  3.7   Chloride 98 - 111 mmol/L 104  105  105   CO2 22 - 32 mmol/L 21  21  22    Calcium  8.9 - 10.3 mg/dL 7.5  7.4  7.6   Total Protein 6.5 - 8.1 g/dL   4.7   Total Bilirubin 0.0 - 1.2 mg/dL   1.2   Alkaline Phos 38 - 126 U/L   60   AST 15 - 41 U/L   13   ALT 0 - 44 U/L   16       Microbiology: 10/16/2023 Pseudomonas bacteremia 1 out of 2 10/19/2023 blood cultures NG 5/15 Sputum culture sent Studies/Results: Chest x-ray right lower lobe consolidation Right  pleural effusion   Assessment/Plan:   Pseudomonas bacteremia on IV cefepime  Repeat blood culture sent on 5/15  no growth so farRt Pleural effusion Rt sided infiltrate  Sputum culture was ESBl ecoli- added bactrim  SS 1 BID for 5 days today Recent hospitalization in [facility] when he had rt  pleural aspiration Cannot use quinolones because of ascending aortic aneurysm of 4 cm and also prolonged QT and patient is on amiodarone  so we will have to get IV antibiotics to complete a course of 2 weeks.until 10/30/23 Will send sputum for culture  Myelofibrosis   Anemia Thrombocytopenia Leucopenia  A-fib on amiodarone  and metoprolol   CKD- cefepime  dose adjusted for crcl  History of right shoulder reverse arthroplasty  History of falls with recent subarachnoid hemorrhages Discussed the management with the patient wife and hospitalist

## 2023-10-23 NOTE — Progress Notes (Addendum)
 PROGRESS NOTE    [name]   ZOX:[id number] DOB: 24-Oct-1940  DOA: 10/16/2023 Date of Service: 10/23/23 which is hospital day 7  PCP: [name], [name] course / significant events:   HPI: [name] is a 83 year old male with history of traumatic subarachnoid hemorrhage, myelofibrosis, symptomatic anemia, persistent atrial fibrillation, mild cognitive impairment, hypothyroid, GERD, BPH, hyperlipidemia, hypothyroid, who presents emergency department for chief concerns of cough and wheezing.  05/12: admitted to hospitalist for sepsis d/t RLL pneumonia, AKI, hyperkalemia, HFpEF exacerbation. Started abx, diuresis  05/13: diuresing, (+)Bcx Pseudomonas, continue abx, 05/14: Cr slowly improving. Briefly Afib RVR but this improved w/ diltiazem  IV x1 and increased beta blocker, resumed home amiodarone . Per ID, plan repeat BCx following 48h abx, continue cefepime   05/15: HR low overnight, soft BP - reduced beta blocker. Repeat BCx drawn today.  05/16: BCx NG thus far. Planning for home w/ HH and IV abx to continue w/ cefepime . Per nephrology - ok for midline or PICC despite CrCl 05/17: orders in for PICC, TOC working on [facility] (should be able to start tomorrow) and IV abx, DME  05/18: unable to place PICC, will need to involve IR tomorrow for PICC/midline or other access prior to dc home on IV abx. ESBL (+)Sputum culture, per ID will repeat CXR  05/19: central line w/ IR placed today. CXR improved aeration but persistent R pleural effusion appears worse on personal review of images - plan thoracentesis given recurrence, IR unable to do this today. IV abx at home confirmed, can go hopefully tomorrow following thoracentesis.    Consultants:  Infectious disease  Palliative care   Procedures/Surgeries: 10/23/23 tunneled cath placement to R internal jugular       ASSESSMENT & PLAN:   Severe sepsis with acute organ dysfunction - improved  Patient has increased respiration  rate, leukopenia, and source of infection is right lower lobe pneumonia and bacteremia Treat underlying cause(s) below    Acute hypoxemic respiratory failure - resolved  Due to PNA and CHF.   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 monitor BMP Per nephrology - ok for midline or PICC despite CrCl   Hyperkalemia - resolved  In setting of acute kidney injury Monitor BMP   Hypomagnesemia monitor and supplement PRN   Weakness Pt declining SNF rehab   Traumatic subarachnoid hemorrhage  In April 2025, patient was discharged to medicine rehabilitation and was discharged from that service on 10/10/2023 No anticoagulation   Hypothyroidism levothyroxine  100 mcg daily    Chronic anemia d/t myelofibrosis  S/p PRBC transfusion 1 unit 10/18/23 monitor Hgb  transfuse to keep Hgb >7   Myelofibrosis  hold home revlimid , per pt's oncologist [name] Palliative care following       N/a based on BMI: There is no height or weight on file to calculate BMI.. Significantly low or high BMI is associated with higher medical risk.  Underweight - under 18  overweight -  25 to 29 obese - 30 or more Class 1 obesity: BMI of 30.0 to 34 Class 2 obesity: BMI of 35.0 to 39 Class 3 obesity: BMI of 40.0 to 49 Super Morbid Obesity: BMI 50-59 Super-super Morbid Obesity: BMI 60+ Healthy nutrition and physical activity advised as adjunct to other disease management and  risk reduction treatments    DVT prophylaxis: no anticoagulation d/t SAH IV fluids: no continuous IV fluids  Nutrition: cardiac diet  Central lines / other devices: none  Code Status: FULL CODE - see palliative note 10/18/23  ACP documentation reviewed: none on file in [facility]  TOC needs: HH / IV abx. Will need to arrange iV abx outpatient and DME Medical barriers to dispo: once get thoracentesis tomorrow hopefully will be able to discharge home              Subjective / Brief ROS:  Patient has no concerns today  No CP/SOB Pain controlled.  Denies new weakness.  Tolerating diet.  Reports no concerns w/ urination/defecation.   Family Communication: wife at bedside on rounds     Objective Findings:  Vitals:   10/23/23 0015 10/23/23 0400 10/23/23 0725 10/23/23 1116  BP: 114/78 101/70 118/85 103/69  Pulse: (!) 110 90 92 75  Resp: 18 17 18 17   Temp: (!) 97.4 F (36.3 C) 97.9 F (36.6 C) 97.9 F (36.6 C) 97.6 F (36.4 C)  TempSrc: Oral Oral Oral Oral  SpO2: 98% 98% 98% 96%    Intake/Output Summary (Last 24 hours) at 10/23/2023 1644 Last data filed at 10/23/2023 1500 Gross per 24 hour  Intake 220.33 ml  Output 350 ml  Net -129.67 ml   There were no vitals filed for this visit.  Examination:  Physical Exam Constitutional:      General: He is not in acute distress. Cardiovascular:     Rate and Rhythm: Normal rate and regular rhythm.  Pulmonary:     Effort: Pulmonary effort is normal. No tachypnea or respiratory distress.     Breath sounds: Examination of the right-upper field reveals decreased breath sounds. Decreased breath sounds present. No wheezing, rhonchi or rales.  Musculoskeletal:     Right lower leg: No edema.     Left lower leg: No edema.  Neurological:     General: No focal deficit present.     Mental Status: He is alert and oriented to person, place, and time.  Psychiatric:        Mood and Affect: Mood normal.        Behavior: Behavior  normal.          Scheduled Medications:   amiodarone   200 mg Oral Daily   cyanocobalamin   1,000 mcg Oral Daily   DULoxetine   20 mg Oral QHS   furosemide   40 mg Oral BID   levothyroxine   100 mcg Oral Q0600   metoprolol  tartrate  25 mg Oral BID   pantoprazole   40 mg Oral Daily   simvastatin   40 mg Oral QHS   sulfamethoxazole -trimethoprim   1 tablet Oral Q12H    Continuous Infusions:  ceFEPime  (MAXIPIME ) IV 200 mL/hr at 10/23/23 0930    PRN Medications:  acetaminophen  **OR** acetaminophen , diphenoxylate -atropine , guaiFENesin -dextromethorphan , ondansetron  **OR** ondansetron  (ZOFRAN ) IV, QUEtiapine , senna-docusate, traMADol   Antimicrobials from admission:  Anti-infectives (From admission, onward)    Start     Dose/Rate Route Frequency Ordered Stop   10/24/23 0000  sulfamethoxazole -trimethoprim  (BACTRIM ) 400-80 MG tablet       Note to Pharmacy: [facility] MEDS TO  BED   1 tablet Oral Every 12 hours 10/23/23 1418 10/28/23 2359   10/23/23 1230  sulfamethoxazole -trimethoprim  (BACTRIM ) 400-80 MG per tablet 1 tablet        1 tablet Oral Every 12 hours 10/23/23 1131 10/28/23 0959   10/20/23 0000  ceFEPime  (MAXIPIME ) IVPB  Status:  Discontinued        2 g Intravenous Every 24 hours 10/20/23 1533 10/20/23    10/20/23 0000  ceFEPime  (MAXIPIME ) IVPB        1 g Intravenous Every 12 hours 10/20/23 1534 10/30/23 2359   10/18/23 1000  ceFEPIme  (MAXIPIME ) 1 g in sodium chloride  0.9 % 100 mL IVPB        1 g 200 mL/hr over 30 Minutes Intravenous Every 12 hours 10/18/23 0831     10/17/23 1200  ceFEPIme  (MAXIPIME ) 2 g in sodium chloride  0.9 % 100 mL IVPB  Status:  Discontinued        2 g 200 mL/hr over 30 Minutes Intravenous Every 24 hours 10/16/23 1338 10/18/23 0831   10/17/23 1000  azithromycin  (ZITHROMAX ) 500 mg in sodium chloride  0.9 % 250 mL IVPB  Status:  Discontinued        500 mg 250 mL/hr over 60 Minutes Intravenous Every 24 hours 10/16/23 1307 10/17/23 0836   10/17/23 0800  vancomycin   variable dose per unstable renal function (pharmacist dosing)  Status:  Discontinued         Does not apply See admin instructions 10/16/23 1344 10/17/23 0836   10/16/23 1330  vancomycin  (VANCOREADY) IVPB 1500 mg/300 mL        1,500 mg 150 mL/hr over 120 Minutes Intravenous  Once 10/16/23 1321 10/16/23 1706   10/16/23 1300  vancomycin  (VANCOCIN ) IVPB 1000 mg/200 mL premix  Status:  Discontinued        1,000 mg 200 mL/hr over 60 Minutes Intravenous  Once 10/16/23 1252 10/16/23 1321   10/16/23 1300  ceFEPIme  (MAXIPIME ) 2 g in sodium chloride  0.9 % 100 mL IVPB        2 g 200 mL/hr over 30 Minutes Intravenous  Once 10/16/23 1252 10/16/23 1341   10/16/23 1300  azithromycin  (ZITHROMAX ) 500 mg in sodium chloride  0.9 % 250 mL IVPB        500 mg 250 mL/hr over 60 Minutes Intravenous  Once 10/16/23 1252 10/16/23 1518           Data Reviewed:  I have personally reviewed the following...  CBC: Recent Labs  Lab 10/18/23 0202 10/18/23 1831 10/19/23 0358 10/20/23 0447 10/21/23 0452 10/22/23 0337  WBC 2.0*  --  3.3* 3.0* 3.2* 3.4*  HGB 7.1* 9.3* 8.2* 8.2* 7.8* 8.0*  HCT 22.5* 28.6* 26.0* 25.0* 24.5* 24.6*  MCV 88.6  --  90.0 89.6 91.1 89.5  PLT 70*  --  79* 78* 94* 118*   Basic Metabolic Panel: Recent Labs  Lab 10/18/23 0202 10/19/23 0358 10/20/23 0447 10/21/23 0452 10/22/23 0337  NA 133* 135 137 135 135  K 4.5 4.1 3.7 3.7 3.7  CL 106 104 105 105 104  CO2 19* 22 22 21* 21*  GLUCOSE 96 111* 96 92 99  BUN 57* 55* 56* 56* 55*  CREATININE 3.45* 3.43* 3.48* 3.44* 3.63*  CALCIUM  6.9* 7.3* 7.6* 7.4* 7.5*  MG 2.1 2.0 1.8 1.8 1.8   GFR: Estimated Creatinine Clearance: 16.3 mL/min (A) (by C-G formula based on SCr of 3.63 mg/dL (H)). Liver Function Tests: Recent Labs  Lab 10/20/23 0447  AST 13*  ALT 16  ALKPHOS 60  BILITOT 1.2  PROT 4.7*  ALBUMIN  2.0*   No results for input(s): "LIPASE", "AMYLASE" in the last 168 hours. No results for input(s): "AMMONIA" in the last 168  hours. Coagulation Profile: No results for input(s): "INR", "PROTIME" in the last 168 hours. Cardiac Enzymes: No results for input(s): "CKTOTAL", "CKMB", "CKMBINDEX", "TROPONINI" in the last 168 hours.  BNP (last 3 results) No results for input(s): "PROBNP" in the last 8760 hours. HbA1C: No results for input(s): "HGBA1C" in the last 72 hours. CBG: No results for input(s): "GLUCAP" in the last 168 hours. Lipid Profile: No results for input(s): "CHOL", "HDL", "LDLCALC", "TRIG", "CHOLHDL", "LDLDIRECT" in the last 72 hours. Thyroid  Function Tests: No results for input(s): "TSH", "T4TOTAL", "FREET4", "T3FREE", "THYROIDAB" in the last 72 hours. Anemia Panel: No results for input(s): "VITAMINB12", "FOLATE", "FERRITIN", "TIBC", "IRON", "RETICCTPCT" in the last 72 hours. Most Recent Urinalysis On File:     Component Value Date/Time   COLORURINE YELLOW 09/21/2023 1814   APPEARANCEUR CLEAR 09/21/2023 1814   APPEARANCEUR Clear 08/04/2023 1507   LABSPEC 1.019 09/21/2023 1814   PHURINE 5.0 09/21/2023 1814   GLUCOSEU NEGATIVE 09/21/2023 1814   HGBUR NEGATIVE 09/21/2023 1814   BILIRUBINUR NEGATIVE 09/21/2023 1814   BILIRUBINUR Negative 08/04/2023 1507   KETONESUR NEGATIVE 09/21/2023 1814   PROTEINUR 100 (A) 09/21/2023 1814   NITRITE NEGATIVE 09/21/2023 1814   LEUKOCYTESUR NEGATIVE 09/21/2023 1814   Sepsis Labs: @LABRCNTIP (procalcitonin:4,lacticidven:4) Microbiology: Recent Results (from the past 240 hours)  Culture, blood (routine x 2)     Status: Abnormal   Collection Time: 10/16/23 10:32 AM   Specimen: BLOOD  Result Value Ref Range Status   Specimen Description   Final    BLOOD RIGHT ANTECUBITAL Performed at [facility], [address] [place], [place] [address]    Special Requests   Final    BOTTLES DRAWN AEROBIC AND ANAEROBIC Blood Culture adequate volume Performed at [facility], [address] [place], [place] [address]    Culture  Setup Time   Final     GRAM NEGATIVE RODS AEROBIC BOTTLE ONLY CRITICAL RESULT CALLED TO, READ BACK BY AND VERIFIED WITHNoe Bath AT 8295 10/17/23 JG Performed at [facility], [address], [place], [place] [address]    Culture PSEUDOMONAS AERUGINOSA (A)  Final   Report Status 10/19/2023 FINAL  Final   Organism ID, Bacteria PSEUDOMONAS AERUGINOSA  Final      Susceptibility   Pseudomonas aeruginosa - MIC*    CEFTAZIDIME 2 SENSITIVE Sensitive     CIPROFLOXACIN  0.5 SENSITIVE Sensitive     GENTAMICIN  <=1 SENSITIVE Sensitive     IMIPENEM 2 SENSITIVE Sensitive     PIP/TAZO 8 SENSITIVE Sensitive ug/mL    CEFEPIME  2 SENSITIVE Sensitive     * PSEUDOMONAS AERUGINOSA  Blood Culture ID Panel (Reflexed)     Status: Abnormal   Collection Time: 10/16/23 10:32 AM  Result Value Ref Range Status   Enterococcus faecalis NOT DETECTED NOT DETECTED Final   Enterococcus Faecium NOT DETECTED NOT DETECTED Final   Listeria monocytogenes NOT DETECTED NOT DETECTED Final   Staphylococcus species NOT DETECTED NOT DETECTED Final   Staphylococcus aureus (BCID) NOT DETECTED NOT DETECTED Final   Staphylococcus epidermidis NOT DETECTED NOT DETECTED Final   Staphylococcus lugdunensis NOT DETECTED NOT DETECTED Final   Streptococcus species NOT DETECTED NOT DETECTED Final   Streptococcus agalactiae NOT DETECTED NOT DETECTED Final   Streptococcus pneumoniae NOT DETECTED NOT  DETECTED Final   Streptococcus pyogenes NOT DETECTED NOT DETECTED Final   A.calcoaceticus-baumannii NOT DETECTED NOT DETECTED Final   Bacteroides fragilis NOT DETECTED NOT DETECTED Final   Enterobacterales NOT DETECTED NOT DETECTED Final   Enterobacter cloacae complex NOT DETECTED NOT DETECTED Final   Escherichia coli NOT DETECTED NOT DETECTED Final   Klebsiella aerogenes NOT DETECTED NOT DETECTED Final   Klebsiella oxytoca NOT DETECTED NOT DETECTED Final   Klebsiella pneumoniae NOT DETECTED NOT DETECTED Final   Proteus species NOT DETECTED NOT  DETECTED Final   Salmonella species NOT DETECTED NOT DETECTED Final   Serratia marcescens NOT DETECTED NOT DETECTED Final   Haemophilus influenzae NOT DETECTED NOT DETECTED Final   Neisseria meningitidis NOT DETECTED NOT DETECTED Final   Pseudomonas aeruginosa DETECTED (A) NOT DETECTED Final    Comment: CRITICAL RESULT CALLED TO, READ BACK BY AND VERIFIED WITH:  NATHAN BELUE AT 1610 10/17/23 JG    Stenotrophomonas maltophilia NOT DETECTED NOT DETECTED Final   Candida albicans NOT DETECTED NOT DETECTED Final   Candida auris NOT DETECTED NOT DETECTED Final   Candida glabrata NOT DETECTED NOT DETECTED Final   Candida krusei NOT DETECTED NOT DETECTED Final   Candida parapsilosis NOT DETECTED NOT DETECTED Final   Candida tropicalis NOT DETECTED NOT DETECTED Final   Cryptococcus neoformans/gattii NOT DETECTED NOT DETECTED Final   CTX-M ESBL NOT DETECTED NOT DETECTED Final   Carbapenem resistance IMP NOT DETECTED NOT DETECTED Final   Carbapenem resistance KPC NOT DETECTED NOT DETECTED Final   Carbapenem resistance NDM NOT DETECTED NOT DETECTED Final   Carbapenem resistance VIM NOT DETECTED NOT DETECTED Final    Comment: Performed at [facility], [address] [place], [place] [address]  Resp panel by RT-PCR (RSV, Flu A&B, Covid) Anterior Nasal Swab     Status: None   Collection Time: 10/16/23  1:04 PM   Specimen: Anterior Nasal Swab  Result Value Ref Range Status   SARS Coronavirus 2 by RT PCR NEGATIVE NEGATIVE Final    Comment: (NOTE) SARS-CoV-2 target nucleic acids are NOT DETECTED.  The SARS-CoV-2 RNA is generally detectable in upper respiratory specimens during the acute phase of infection. The lowest concentration of SARS-CoV-2 viral copies this assay can detect is 138 copies/mL. A negative result does not preclude SARS-Cov-2 infection and should not be used as the sole basis for treatment or other patient management decisions. A negative result may occur with  improper  specimen collection/handling, submission of specimen other than nasopharyngeal swab, presence of viral mutation(s) within the areas targeted by this assay, and inadequate number of viral copies(<138 copies/mL). A negative result must be combined with clinical observations, patient history, and epidemiological information. The expected result is Negative.  Fact Sheet for Patients:  BloggerCourse.com  Fact Sheet for Healthcare Providers:  SeriousBroker.it  This test is no t yet approved or cleared by the [place]  FDA and  has been authorized for detection and/or diagnosis of SARS-CoV-2 by FDA under an Emergency Use Authorization (EUA). This EUA will remain  in effect (meaning this test can be used) for the duration of the COVID-19 declaration under Section 564(b)(1) of the Act, 21 U.S.C.section 360bbb-3(b)(1), unless the authorization is terminated  or revoked sooner.       Influenza A by PCR NEGATIVE NEGATIVE Final   Influenza B by PCR NEGATIVE NEGATIVE Final    Comment: (NOTE) The Xpert Xpress SARS-CoV-2/FLU/RSV plus assay is intended as an aid in the diagnosis of influenza from Nasopharyngeal swab  specimens and should not be used as a sole basis for treatment. Nasal washings and aspirates are unacceptable for Xpert Xpress SARS-CoV-2/FLU/RSV testing.  Fact Sheet for Patients: BloggerCourse.com  Fact Sheet for Healthcare Providers: SeriousBroker.it  This test is not yet approved or cleared by the [place]  FDA and has been authorized for detection and/or diagnosis of SARS-CoV-2 by FDA under an Emergency Use Authorization (EUA). This EUA will remain in effect (meaning this test can be used) for the duration of the COVID-19 declaration under Section 564(b)(1) of the Act, 21 U.S.C. section 360bbb-3(b)(1), unless the authorization is terminated or revoked.     Resp  Syncytial Virus by PCR NEGATIVE NEGATIVE Final    Comment: (NOTE) Fact Sheet for Patients: BloggerCourse.com  Fact Sheet for Healthcare Providers: SeriousBroker.it  This test is not yet approved or cleared by the [place]  FDA and has been authorized for detection and/or diagnosis of SARS-CoV-2 by FDA under an Emergency Use Authorization (EUA). This EUA will remain in effect (meaning this test can be used) for the duration of the COVID-19 declaration under Section 564(b)(1) of the Act, 21 U.S.C. section 360bbb-3(b)(1), unless the authorization is terminated or revoked.  Performed at [facility], [address] [place], [place] [address]   Culture, blood (routine x 2)     Status: None   Collection Time: 10/17/23 12:07 AM   Specimen: BLOOD RIGHT ARM  Result Value Ref Range Status   Specimen Description BLOOD RIGHT ARM  Final   Special Requests   Final    BOTTLES DRAWN AEROBIC AND ANAEROBIC Blood Culture adequate volume   Culture   Final    NO GROWTH 5 DAYS Performed at [facility], [address] [place], [place] [address]    Report Status 10/22/2023 FINAL  Final  MRSA Next Gen by PCR, Nasal     Status: None   Collection Time: 10/17/23  9:08 PM   Specimen: Nasal Mucosa; Nasal Swab  Result Value Ref Range Status   MRSA by PCR Next Gen NOT DETECTED NOT DETECTED Final    Comment: (NOTE) The GeneXpert MRSA Assay (FDA approved for NASAL specimens only), is one component of a comprehensive MRSA colonization surveillance program. It is not intended to diagnose MRSA infection nor to guide or monitor treatment for MRSA infections. Test performance is not FDA approved in patients less than 5 years old. Performed at [facility], [address] [place], [place] [address]   Culture, blood (Routine X 2) w Reflex to ID Panel     Status: None (Preliminary result)   Collection Time: 10/19/23  8:59 AM    Specimen: BLOOD LEFT HAND  Result Value Ref Range Status   Specimen Description BLOOD LEFT HAND  Final   Special Requests   Final    BOTTLES DRAWN AEROBIC AND ANAEROBIC Blood Culture adequate volume   Culture   Final    NO GROWTH 4 DAYS Performed at [facility], [address], [place], [place] [address]    Report Status PENDING  Incomplete  Culture, blood (Routine X 2) w Reflex to ID Panel     Status: None (Preliminary result)   Collection Time: 10/19/23  9:06 AM   Specimen: BLOOD RIGHT HAND  Result Value Ref Range Status   Specimen Description BLOOD RIGHT HAND  Final   Special Requests   Final    BOTTLES DRAWN AEROBIC AND ANAEROBIC Blood Culture adequate volume   Culture   Final  NO GROWTH 4 DAYS Performed at [facility], [address] [place], [place] [address]    Report Status PENDING  Incomplete  Expectorated Sputum Assessment w Gram Stain, Rflx to Resp Cult     Status: None   Collection Time: 10/19/23 10:20 PM   Specimen: Expectorated Sputum  Result Value Ref Range Status   Specimen Description EXPECTORATED SPUTUM  Final   Special Requests NONE  Final   Sputum evaluation   Final    THIS SPECIMEN IS ACCEPTABLE FOR SPUTUM CULTURE Performed at [facility], [address], [place], [place] [address]    Report Status 10/19/2023 FINAL  Final  Culture, Respiratory w Gram Stain     Status: None   Collection Time: 10/19/23 10:20 PM  Result Value Ref Range Status   Specimen Description   Final    EXPECTORATED SPUTUM Performed at [facility], [address] [place], [place] [address]    Special Requests   Final    NONE Reflexed from [phone] Performed at [facility] [address] [place], [place] [address]    Gram Stain   Final    RARE WBC PRESENT, PREDOMINANTLY PMN MODERATE GRAM POSITIVE COCCI MODERATE GRAM NEGATIVE RODS Performed at [facility], [address], [place], [place] [address]    Culture    Final    FEW ESCHERICHIA COLI Confirmed Extended Spectrum Beta-Lactamase Producer (ESBL).  In bloodstream infections from ESBL organisms, carbapenems are preferred over piperacillin /tazobactam. They are shown to have a lower risk of mortality.    Report Status 10/22/2023 FINAL  Final   Organism ID, Bacteria ESCHERICHIA COLI  Final      Susceptibility   Escherichia coli - MIC*    AMPICILLIN >=32 RESISTANT Resistant     CEFEPIME  4 INTERMEDIATE Intermediate     CEFTAZIDIME RESISTANT Resistant     CEFTRIAXONE >=64 RESISTANT Resistant     CIPROFLOXACIN  >=4 RESISTANT Resistant     GENTAMICIN  <=1 SENSITIVE Sensitive     IMIPENEM <=0.25 SENSITIVE Sensitive     TRIMETH /SULFA  <=20 SENSITIVE Sensitive     AMPICILLIN/SULBACTAM 4 SENSITIVE Sensitive     PIP/TAZO <=4 SENSITIVE Sensitive ug/mL    * FEW ESCHERICHIA COLI      Radiology Studies last 3 days: IR Fluoro Guide CV Line Right Result Date: 10/23/2023 INDICATION: 83 year old male referred for central venous catheter EXAM: TUNNELED CENTRAL VENOUS CATHETER WITH ULTRASOUND AND FLUOROSCOPIC GUIDANCE MEDICATIONS: None ANESTHESIA/SEDATION: None FLUOROSCOPY: Radiation Exposure Index (as provided by the fluoroscopic device): 0 mGy Kerma COMPLICATIONS: None PROCEDURE: Informed written consent was obtained from the patient after a discussion of the risks, benefits, and alternatives to treatment. Questions regarding the procedure were encouraged and answered. The right neck and chest were prepped with chlorhexidine  in a sterile fashion, and a sterile drape was applied covering the operative field. Maximum barrier sterile technique with sterile gowns and gloves were used for the procedure. A timeout was performed prior to the initiation of the procedure. After written informed consent was obtained, patient was placed in the supine position on angiographic table. Patency of the right internal jugular vein was confirmed with ultrasound with image documentation.  Patient was prepped and draped in the usual sterile fashion including the right neck and right superior chest. Using ultrasound guidance, the skin and subcutaneous tissues overlying the right internal jugular vein were generously infiltrated with 1% lidocaine  without epinephrine . Using ultrasound guidance, the right internal jugular vein was punctured with a micropuncture needle,  and an 018 wire was advanced into the right heart confirming venous access. A small stab incision was made with an 11 blade scalpel. Peel-away sheath was placed over the wire, and then the wire was removed, marking the wire for estimation of internal catheter length. The chest wall was then generously infiltrated with 1% lidocaine  for local anesthesia along the tissue tract. Small stab incision was made with 11 blade scalpel, and then the catheter was back tunneled to the puncture site at the right internal jugular vein. Catheter was pulled through the tract, with the catheter amputated at 20 cm. Catheter was advanced through the peel-away sheath, and the peel-away sheath was removed. Final image was stored. The catheter was anchored to the chest wall with 2 retention sutures, and Derma bond was used to seal the right internal jugular vein incision site and at the right chest wall. Patient tolerated the procedure well and remained hemodynamically stable throughout. No complications were encountered and no significant blood loss was encountered. FINDINGS: After catheter placement, the tip lies within the superior cavoatrial junction. The catheter aspirates and flushes normally and is ready for immediate use. IMPRESSION: Status post right IJ tunneled, double-lumen central venous catheter. Signed, Marciano Settles. Rexine Cater, RPVI [facility] Radiology Electronically Signed   By: Myrlene Asper D.O.   On: 10/23/2023 10:48   DG Chest Port 1 View Result Date: 10/22/2023 CLINICAL DATA:  [id number]  Pneumonia [id number] EXAM: PORTABLE CHEST 1 VIEW COMPARISON:  Oct 16, 2023 FINDINGS: The cardiomediastinal silhouette is unchanged in contour. Moderate RIGHT pleural effusion. No pneumothorax. Mildly improved aeration of the RIGHT lung base compared to prior with persistent RIGHT retrocardiac opacity. IMPRESSION: 1. Mildly improved aeration of the RIGHT lung base with persistent RIGHT retrocardiac opacity. 2. Moderate RIGHT pleural effusion. Electronically Signed   By: Clancy Crimes M.D.   On: 10/22/2023 18:47   US  EKG SITE RITE Result Date: 10/21/2023 If Site Rite image not attached, placement could not be confirmed due to current cardiac rhythm.          Vickey Boak, DO [facility] 10/23/2023, 4:44 PM    Dictation software may have been used to generate the above note. Typos may occur and escape review in typed/dictated notes. Please contact Dr Authur Leghorn directly for clarity if needed.  Staff may message me via secure chat in Epic  but this may not receive an immediate response,  please page me for urgent matters!  If 7PM-7AM, please contact night coverage [email or web address]

## 2023-10-23 NOTE — Progress Notes (Signed)
 Patient transported down to IR at this time with transport

## 2023-10-24 ENCOUNTER — Inpatient Hospital Stay

## 2023-10-24 ENCOUNTER — Inpatient Hospital Stay: Admitting: Oncology

## 2023-10-24 ENCOUNTER — Encounter: Attending: Registered Nurse | Admitting: Registered Nurse

## 2023-10-24 DIAGNOSIS — A419 Sepsis, unspecified organism: Secondary | ICD-10-CM | POA: Diagnosis not present

## 2023-10-24 DIAGNOSIS — R6520 Severe sepsis without septic shock: Secondary | ICD-10-CM | POA: Diagnosis not present

## 2023-10-24 LAB — CULTURE, BLOOD (ROUTINE X 2)
Culture: NO GROWTH
Culture: NO GROWTH
Special Requests: ADEQUATE
Special Requests: ADEQUATE

## 2023-10-24 MED ORDER — LIDOCAINE HCL 1 % IJ SOLN
10.0000 mL | Freq: Once | INTRAMUSCULAR | Status: AC
  Administered 2023-10-24: 10 mL via INTRADERMAL

## 2023-10-24 NOTE — Progress Notes (Signed)
 [facility] Liaison Note  10/24/2023  [name] [birth year] [id number]  Location: RN Hospital Liaison met patient at bedside at [facility].  Insurance: Medicare   [name] is a 83 y.o. male who is a Primary Care Patient of Lincoln Renshaw, Jerilee Montane, DO [facility].The patient was screened for 30 day readmission hospitalization with noted extreme risk score for unplanned readmission risk with 4 IP in 6 months.  The patient was assessed for potential Care Management service needs for post hospital transition for care coordination. Review of patient's electronic medical record reveals patient was admitted for Severe sepsis with acute organ. Liaison attempted bedside visit. Pt off the floor in radiology.  Pt is followed heavily by oncology. Pt will discharge with [facility].   Plan: [facility] Liaison will continue to follow progress and disposition to asess for post hospital community care coordination/management needs.  Referral request for community care coordination: anticipate Transitions of Care Team follow up.   [facility] Care Management/Population Health does not replace or interfere with any arrangements made by the Inpatient Transition of Care team.   For questions contact:   Lilla Reichert, RN, BSN Hospital Liaison [place]   [facility], Population Health Office Hours MTWF  8:00 am-6:00 pm Direct Dial: [phone] mobile [email or web address]

## 2023-10-24 NOTE — Discharge Summary (Signed)
 Physician Discharge Summary   Patient: [name] MRN: [id number]  DOB: 30-Jan-1941   Admit:     Date of Admission: 10/16/2023 Admitted from: home   Discharge: Date of discharge: 10/24/23 Disposition: Home with home healt Condition at discharge: good  CODE STATUS: FULL CODE     Discharge Physician: Melodi Sprung, DO [facility]     PCP: [name], DO  Recommendations for Outpatient Follow-up:  Follow up with PCP [name], DO in 1-2 weeks    Discharge Instructions     [facility] pharmacist to adjust dose for Vancomycin , Aminoglycosides and other anti-infective therapies as requested by physician.   Complete by: As directed    [facility] to provide Cath Flo 2mg    Complete by: As directed    Administer for PICC line occlusion and as ordered by physician for other access device issues.   Anaphylaxis Kit: Provided to treat any anaphylactic reaction to the medication being provided to the patient if First Dose or when requested by physician   Complete by: As directed    Epinephrine  1mg /ml vial / amp: Administer 0.3mg  (0.93ml) subcutaneously once for moderate to severe anaphylaxis, nurse to call physician and pharmacy when reaction occurs and call 911 if needed for immediate care   Diphenhydramine  50mg /ml IV vial: Administer 25-50mg  IV/IM PRN for first dose reaction, rash, itching, mild reaction, nurse to call physician and pharmacy when reaction occurs   Sodium Chloride  0.9% NS 500ml IV: Administer if needed for hypovolemic blood pressure drop or as ordered by physician after call to physician with anaphylactic reaction   Change dressing on IV access line weekly and PRN   Complete by: As directed    Diet - low sodium heart healthy   Complete by: As directed    Flush IV access with Sodium Chloride  0.9% and Heparin  10 units/ml or 100 units/ml   Complete by: As directed    Home infusion instructions - [facility]    Complete by: As directed    Instructions: Flush IV access with Sodium Chloride  0.9% and Heparin  10units/ml or 100units/ml   Change dressing on IV access line: Weekly and PRN   Instructions Cath Flo 2mg : Administer for PICC Line occlusion and as ordered by physician for other access device   [facility] pharmacist to adjust dose for: Vancomycin , Aminoglycosides and other anti-infective therapies as requested by physician   Increase activity slowly   Complete by: As directed    Method of administration may be changed at the discretion of home infusion pharmacist based upon assessment of the patient and/or caregiver's ability to self-administer the medication ordered   Complete by: As directed    No wound care   Complete by: As directed          Discharge Diagnoses: Principal Problem:   Severe sepsis with acute organ dysfunction (HCC) Active Problems:   Hyperkalemia   Acute kidney injury (HCC)   Right lower lobe pneumonia   BPH (benign prostatic hyperplasia)   Symptomatic anemia   Mixed hyperlipidemia   Hypothyroidism   CKD (chronic kidney disease) stage 4, GFR 15-29 ml/min (HCC)   History of CVA (cerebrovascular accident)   Traumatic subarachnoid hemorrhage (HCC)   Persistent atrial fibrillation (HCC)   Mild cognitive impairment   Cough   Weakness   Hypomagnesemia   Palliative care encounter   Bacteremia due to Pseudomonas   Pancytopenia (HCC)  Hospital course / significant events:   HPI: [name] is a 83 year old male with history of traumatic subarachnoid hemorrhage, myelofibrosis, symptomatic anemia, persistent atrial fibrillation, mild cognitive impairment, hypothyroid, GERD, BPH, hyperlipidemia, hypothyroid, who presents emergency department for chief concerns of cough and wheezing.  05/12: admitted to hospitalist for sepsis d/t RLL pneumonia, AKI, hyperkalemia, HFpEF exacerbation. Started abx, diuresis  05/13: diuresing, (+)Bcx  Pseudomonas 05/14: Cr slowly improving. Briefly Afib RVR but this improved w/ tx, did not require drip 05/15: Repeat BCx drawn today.  05/16: BCx NG thus far. Planning for home w/ [facility] and IV abx to continue w/ cefepime .  05/17: orders in for PICC, TOC working on [facility] (should be able to start tomorrow) and IV abx, DME  05/18: unable to place PICC, will need to involve IR for PICC/midline or other access prior to dc home on IV abx. ESBL (+)Sputum culture, per ID will repeat CXR  05/19: central line w/ IR placed today. CXR improved aeration but persistent R pleural effusion appears worse on personal review of images - plan thoracentesis given recurrence, IR unable to do this today. IV abx at home confirmed, can go hopefully tomorrow following thoracentesis. 05/20: thoracentesis today uneventful, removed 450 mL. PM dose abx given, pt stable for dc home.     Consultants:  Infectious disease  Palliative care   Procedures/Surgeries: 10/23/23 tunneled cath placement to R internal jugular       ASSESSMENT & PLAN:   Severe sepsis with acute organ dysfunction - improved  Patient has increased respiration rate, leukopenia, and source of infection is right lower lobe pneumonia and bacteremia Treat underlying cause(s) below    Acute hypoxemic respiratory failure - resolved  Due to PNA and CHF.   O2 support as needed  Treat underlying cause(s) below  Right lower lobe pneumonia Pseudomonal bacteremia due to pneumonia  (+)ESBL EColi on sputum culture  cefepime  to continue on discharge per ID recs/orders, abx through 10/30/23  Pleural effusion Thoracentesis Monitor CXR outpatient   Afib RVR - resolved now in NSR RVR on tele 10/18/23 Doing well on metoprolol  and amiodarone   Acute dCHF exacerbation - improved  Lasix  to po  Daily weights   Acute kidney injury on CKD4 monitor BMP Per nephrology - ok for midline or PICC despite CrCl   Hyperkalemia - resolved  In setting of acute kidney  injury Monitor BMP   Hypomagnesemia monitor and supplement PRN   Weakness Pt declining SNF rehab   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 Palliative care following       N/a based on BMI: There is no height or weight on file to calculate BMI.. Significantly low or high BMI is associated with higher medical risk.  Underweight - under 18  overweight - 25 to 29 obese - 30 or more Class 1 obesity: BMI of 30.0 to 34 Class 2 obesity: BMI of 35.0 to 39 Class 3 obesity: BMI of 40.0 to 49 Super Morbid Obesity: BMI 50-59 Super-super Morbid Obesity: BMI 60+ Healthy nutrition and physical activity advised as adjunct to other disease management and risk reduction treatments         Discharge  Instructions  Allergies as of 10/24/2023       Reactions   Quinolones Other (See Comments)   Patient with aortic aneurysm. Use of quinolones are considered to be contraindicated due to risk of aortic rupture.   Meloxicam Nausea And Vomiting        Medication List     PAUSE taking these medications    lenalidomide  5 MG capsule Wait to take this until your doctor or other care provider tells you to start again. Commonly known as: REVLIMID  Take 1 capsule (5 mg total) by mouth daily. Take for 21 days, then hold for 7 days. Repeat every 28 days.       TAKE these medications    acetaminophen  500 MG tablet Commonly known as: TYLENOL  Take 1,000 mg by mouth every 6 (six) hours as needed for mild pain (pain score 1-3).   amiodarone  200 MG tablet Commonly known as: PACERONE  Take 1 tablet (200 mg total) by mouth daily.   ceFEPime   IVPB Commonly known as: MAXIPIME  Inject 1 g into the vein every 12 (twelve) hours for 10 days. Indication: Bloodstream infection First Dose: Yes Last Day of Therapy:  10/30/23 Labs - Once weekly:  CBC/D and CMP, Please Remove PICC at completion of treatment.  Method of administration: IV Push Method of administration may be changed at the discretion of home infusion pharmacist based upon assessment of the patient and/or caregiver's ability to self-administer the medication ordered. Fax weekly lab results  promptly to [phone]   cyanocobalamin  1000 MCG tablet Commonly known as: VITAMIN B12 Take 1 tablet (1,000 mcg total) by mouth daily.   DULoxetine  20 MG capsule Commonly known as: CYMBALTA  Take 1 capsule (20 mg total) by mouth at bedtime.   furosemide  40 MG tablet Commonly known as: LASIX  Take 1 tablet (40 mg total) by mouth 2 (two) times daily.   levothyroxine  100 MCG tablet Commonly known as: SYNTHROID  Take 1 tablet (100 mcg total) by mouth daily before breakfast.   lidocaine  5 % Commonly known as: LIDODERM  Place 1 patch onto the skin daily. Remove & Discard patch within 12 hours or as directed by MD   metoprolol  tartrate 50 MG tablet Commonly known as: LOPRESSOR  Take 0.5 tablets (25 mg total) by mouth 2 (two) times daily. What changed: how much to take   multivitamin with minerals Tabs tablet Take 1 tablet by mouth in the morning.   ondansetron  4 MG tablet Commonly known as: ZOFRAN  Take 1 tablet (4 mg total) by mouth every 6 (six) hours as needed for nausea.   pantoprazole  40 MG tablet Commonly known as: PROTONIX  Take 1 tablet (40 mg total) by mouth daily.   QUEtiapine  25 MG tablet Commonly known as: SEROQUEL  Take 1 tablet (25 mg total) by mouth at bedtime as needed (sleep, restlessness).   Robafen DM 20-200 MG/20ML Liqd Generic drug: Dextromethorphan -guaiFENesin  Take 10 mLs by mouth every 4 (four) hours as needed.   simvastatin  40 MG tablet Commonly  known as: ZOCOR  Take 1 tablet (40 mg total) by mouth at bedtime.   sodium bicarbonate  650 MG tablet Take 1 tablet (650 mg total) by mouth 3 (three) times daily. What changed: how much to take   sulfamethoxazole -trimethoprim  400-80 MG tablet Commonly known as: BACTRIM  Take 1 tablet by mouth every 12 (twelve) hours for 4 days.   traMADol  50 MG tablet Commonly known as: ULTRAM  Take 1 tablet (50 mg total) by mouth every 12 (twelve) hours as needed for moderate pain (pain score  4-6).               Durable Medical Equipment  (From admission, onward)           Start     Ordered   10/21/23 1232  For home use only DME lightweight manual wheelchair with seat cushion  Once       Comments: Patient suffers from debility, anemia, myelodysplasia, pneumonia, bacteremia which impairs their ability to perform daily activities like bathing, dressing, and toileting in the home.  A cane, crutch, or walker will not resolve  issue with performing activities of daily living. A wheelchair will allow patient to safely perform daily activities. Patient is not able to propel themselves in the home using a standard weight wheelchair due to endurance and general weakness. Patient can self propel in the lightweight wheelchair. Length of need Lifetime. Accessories: elevating leg rests (ELRs), wheel locks, extensions and anti-tippers.   10/21/23 1232   10/21/23 1232  For home use only DME wheelchair cushion (seat and back)  Once        10/21/23 1232   10/21/23 1232  For home use only DME Bedside commode  Once       Question Answer Comment  Patient needs a bedside commode to treat with the following condition Debility   Patient needs a bedside commode to treat with the following condition Pneumonia      10/21/23 1232   10/21/23 1231  For home use only DME Walker rolling  Once       Question Answer Comment  Walker: With 5 Inch Wheels   Patient needs a walker to treat with the following condition Debility    Patient needs a walker to treat with the following condition Pneumonia      10/21/23 1232              Discharge Care Instructions  (From admission, onward)           Start     Ordered   10/20/23 0000  Change dressing on IV access line weekly and PRN  (Home infusion instructions - [facility] )        10/20/23 1533             Follow-up Information     Johnson, Megan P, DO. Schedule an appointment as soon as possible for a visit.   Specialty: Family Medicine Why: hospital follow up 1-2 weeks Contact information: [address] [place] [address] [phone]                 Allergies  Allergen Reactions   Quinolones Other (See Comments)    Patient with aortic aneurysm. Use of quinolones are considered to be contraindicated due to risk of aortic rupture.   Meloxicam Nausea And Vomiting     Subjective: pt examined following thoracentesis, no concerns, he is feeling eager to go home. Tired, generally weak but no focal weakness. He's ambulating, voiding, tolerating diet.    Discharge Exam: BP 92/60 (BP Location: Left Arm)   Pulse 73   Temp 97.6 F (36.4 C) (Oral)   Resp 18   SpO2 95%  General: Pt is alert, awake, not in acute distress Cardiovascular: RRR, S1/S2 +, no rubs, no gallops Respiratory: CTA except some diminiished breath sounds lower bilaterally Abdominal: Soft, NT, ND, bowel sounds + Extremities: no edema, no cyanosis     The results of significant diagnostics from this hospitalization (including imaging, microbiology, ancillary and laboratory) are  listed below for reference.     Microbiology: Recent Results (from the past 240 hours)  Culture, blood (routine x 2)     Status: Abnormal   Collection Time: 10/16/23 10:32 AM   Specimen: BLOOD  Result Value Ref Range Status   Specimen Description   Final    BLOOD RIGHT ANTECUBITAL Performed at [facility], [address] [place], [place] [address]    Special  Requests   Final    BOTTLES DRAWN AEROBIC AND ANAEROBIC Blood Culture adequate volume Performed at [facility], [address] [place], [place] [address]    Culture  Setup Time   Final    GRAM NEGATIVE RODS AEROBIC BOTTLE ONLY CRITICAL RESULT CALLED TO, READ BACK BY AND VERIFIED WITHNoe Bath AT 6606 10/17/23 JG Performed at [facility], [address] [place], [place] [address]    Culture PSEUDOMONAS AERUGINOSA (A)  Final   Report Status 10/19/2023 FINAL  Final   Organism ID, Bacteria PSEUDOMONAS AERUGINOSA  Final      Susceptibility   Pseudomonas aeruginosa - MIC*    CEFTAZIDIME 2 SENSITIVE Sensitive     CIPROFLOXACIN  0.5 SENSITIVE Sensitive     GENTAMICIN  <=1 SENSITIVE Sensitive     IMIPENEM 2 SENSITIVE Sensitive     PIP/TAZO 8 SENSITIVE Sensitive ug/mL    CEFEPIME  2 SENSITIVE Sensitive     * PSEUDOMONAS AERUGINOSA  Blood Culture ID Panel (Reflexed)     Status: Abnormal   Collection Time: 10/16/23 10:32 AM  Result Value Ref Range Status   Enterococcus faecalis NOT DETECTED NOT DETECTED Final   Enterococcus Faecium NOT DETECTED NOT DETECTED Final   Listeria monocytogenes NOT DETECTED NOT DETECTED Final   Staphylococcus species NOT DETECTED NOT DETECTED Final   Staphylococcus aureus (BCID) NOT DETECTED NOT DETECTED Final   Staphylococcus epidermidis NOT DETECTED NOT DETECTED Final   Staphylococcus lugdunensis NOT DETECTED NOT DETECTED Final   Streptococcus species NOT DETECTED NOT DETECTED Final   Streptococcus agalactiae NOT DETECTED NOT DETECTED Final   Streptococcus pneumoniae NOT DETECTED NOT DETECTED Final   Streptococcus pyogenes NOT DETECTED NOT DETECTED Final   A.calcoaceticus-baumannii NOT DETECTED NOT DETECTED Final   Bacteroides fragilis NOT DETECTED NOT DETECTED Final   Enterobacterales NOT DETECTED NOT DETECTED Final   Enterobacter cloacae complex NOT DETECTED NOT DETECTED Final   Escherichia coli NOT DETECTED NOT DETECTED Final    Klebsiella aerogenes NOT DETECTED NOT DETECTED Final   Klebsiella oxytoca NOT DETECTED NOT DETECTED Final   Klebsiella pneumoniae NOT DETECTED NOT DETECTED Final   Proteus species NOT DETECTED NOT DETECTED Final   Salmonella species NOT DETECTED NOT DETECTED Final   Serratia marcescens NOT DETECTED NOT DETECTED Final   Haemophilus influenzae NOT DETECTED NOT DETECTED Final   Neisseria meningitidis NOT DETECTED NOT DETECTED Final   Pseudomonas aeruginosa DETECTED (A) NOT DETECTED Final    Comment: CRITICAL RESULT CALLED TO, READ BACK BY AND VERIFIED WITH:  NATHAN BELUE AT 1093 10/17/23 JG    Stenotrophomonas maltophilia NOT DETECTED NOT DETECTED Final   Candida albicans NOT DETECTED NOT DETECTED Final   Candida auris NOT DETECTED NOT DETECTED Final   Candida glabrata NOT DETECTED NOT DETECTED Final   Candida krusei NOT DETECTED NOT DETECTED Final   Candida parapsilosis NOT DETECTED NOT DETECTED Final   Candida tropicalis NOT DETECTED NOT DETECTED Final   Cryptococcus neoformans/gattii NOT DETECTED NOT DETECTED Final   CTX-M ESBL NOT DETECTED NOT DETECTED Final  Carbapenem resistance IMP NOT DETECTED NOT DETECTED Final   Carbapenem resistance KPC NOT DETECTED NOT DETECTED Final   Carbapenem resistance NDM NOT DETECTED NOT DETECTED Final   Carbapenem resistance VIM NOT DETECTED NOT DETECTED Final    Comment: Performed at [facility], [address] [place], [place] [address]  Resp panel by RT-PCR (RSV, Flu A&B, Covid) Anterior Nasal Swab     Status: None   Collection Time: 10/16/23  1:04 PM   Specimen: Anterior Nasal Swab  Result Value Ref Range Status   SARS Coronavirus 2 by RT PCR NEGATIVE NEGATIVE Final    Comment: (NOTE) SARS-CoV-2 target nucleic acids are NOT DETECTED.  The SARS-CoV-2 RNA is generally detectable in upper respiratory specimens during the acute phase of infection. The lowest concentration of SARS-CoV-2 viral copies this assay can detect is 138  copies/mL. A negative result does not preclude SARS-Cov-2 infection and should not be used as the sole basis for treatment or other patient management decisions. A negative result may occur with  improper specimen collection/handling, submission of specimen other than nasopharyngeal swab, presence of viral mutation(s) within the areas targeted by this assay, and inadequate number of viral copies(<138 copies/mL). A negative result must be combined with clinical observations, patient history, and epidemiological information. The expected result is Negative.  Fact Sheet for Patients:  BloggerCourse.com  Fact Sheet for Healthcare Providers:  SeriousBroker.it  This test is no t yet approved or cleared by the [place]  FDA and  has been authorized for detection and/or diagnosis of SARS-CoV-2 by FDA under an Emergency Use Authorization (EUA). This EUA will remain  in effect (meaning this test can be used) for the duration of the COVID-19 declaration under Section 564(b)(1) of the Act, 21 U.S.C.section 360bbb-3(b)(1), unless the authorization is terminated  or revoked sooner.       Influenza A by PCR NEGATIVE NEGATIVE Final   Influenza B by PCR NEGATIVE NEGATIVE Final    Comment: (NOTE) The Xpert Xpress SARS-CoV-2/FLU/RSV plus assay is intended as an aid in the diagnosis of influenza from Nasopharyngeal swab specimens and should not be used as a sole basis for treatment. Nasal washings and aspirates are unacceptable for Xpert Xpress SARS-CoV-2/FLU/RSV testing.  Fact Sheet for Patients: BloggerCourse.com  Fact Sheet for Healthcare Providers: SeriousBroker.it  This test is not yet approved or cleared by the [place]  FDA and has been authorized for detection and/or diagnosis of SARS-CoV-2 by FDA under an Emergency Use Authorization (EUA). This EUA will remain in effect (meaning  this test can be used) for the duration of the COVID-19 declaration under Section 564(b)(1) of the Act, 21 U.S.C. section 360bbb-3(b)(1), unless the authorization is terminated or revoked.     Resp Syncytial Virus by PCR NEGATIVE NEGATIVE Final    Comment: (NOTE) Fact Sheet for Patients: BloggerCourse.com  Fact Sheet for Healthcare Providers: SeriousBroker.it  This test is not yet approved or cleared by the [place]  FDA and has been authorized for detection and/or diagnosis of SARS-CoV-2 by FDA under an Emergency Use Authorization (EUA). This EUA will remain in effect (meaning this test can be used) for the duration of the COVID-19 declaration under Section 564(b)(1) of the Act, 21 U.S.C. section 360bbb-3(b)(1), unless the authorization is terminated or revoked.  Performed at [facility] [address] [place], [place] [address]   Culture, blood (routine x 2)     Status: None   Collection Time: 10/17/23 12:07 AM   Specimen: BLOOD RIGHT ARM  Result Value Ref Range Status   Specimen Description BLOOD RIGHT ARM  Final   Special Requests   Final    BOTTLES DRAWN AEROBIC AND ANAEROBIC Blood Culture adequate volume   Culture   Final    NO GROWTH 5 DAYS Performed at [facility], [address] [place], [place] [address]    Report Status 10/22/2023 FINAL  Final  MRSA Next Gen by PCR, Nasal     Status: None   Collection Time: 10/17/23  9:08 PM   Specimen: Nasal Mucosa; Nasal Swab  Result Value Ref Range Status   MRSA by PCR Next Gen NOT DETECTED NOT DETECTED Final    Comment: (NOTE) The GeneXpert MRSA Assay (FDA approved for NASAL specimens only), is one component of a comprehensive MRSA colonization surveillance program. It is not intended to diagnose MRSA infection nor to guide or monitor treatment for MRSA infections. Test performance is not FDA approved in patients less than 20  years old. Performed at [facility], [address] [place], [place] [address]   Culture, blood (Routine X 2) w Reflex to ID Panel     Status: None   Collection Time: 10/19/23  8:59 AM   Specimen: BLOOD LEFT HAND  Result Value Ref Range Status   Specimen Description BLOOD LEFT HAND  Final   Special Requests   Final    BOTTLES DRAWN AEROBIC AND ANAEROBIC Blood Culture adequate volume   Culture   Final    NO GROWTH 5 DAYS Performed at [facility], [address] [place], [place] [address]    Report Status 10/24/2023 FINAL  Final  Culture, blood (Routine X 2) w Reflex to ID Panel     Status: None   Collection Time: 10/19/23  9:06 AM   Specimen: BLOOD RIGHT HAND  Result Value Ref Range Status   Specimen Description BLOOD RIGHT HAND  Final   Special Requests   Final    BOTTLES DRAWN AEROBIC AND ANAEROBIC Blood Culture adequate volume   Culture   Final    NO GROWTH 5 DAYS Performed at [facility], [address], [place], [place] [address]    Report Status 10/24/2023 FINAL  Final  Expectorated Sputum Assessment w Gram Stain, Rflx to Resp Cult     Status: None   Collection Time: 10/19/23 10:20 PM   Specimen: Expectorated Sputum  Result Value Ref Range Status   Specimen Description EXPECTORATED SPUTUM  Final   Special Requests NONE  Final   Sputum evaluation   Final    THIS SPECIMEN IS ACCEPTABLE FOR SPUTUM CULTURE Performed at [facility], [address] [place], [place] [address]    Report Status 10/19/2023 FINAL  Final  Culture, Respiratory w Gram Stain     Status: None   Collection Time: 10/19/23 10:20 PM  Result Value Ref Range Status   Specimen Description   Final    EXPECTORATED SPUTUM Performed at [facility], [address], [place], [place] [address]    Special Requests   Final    NONE Reflexed from [id number] Performed at [facility], [address] [place], [place] [address]    Gram Stain   Final     RARE WBC PRESENT, PREDOMINANTLY PMN MODERATE GRAM POSITIVE COCCI MODERATE GRAM NEGATIVE RODS Performed at [facility], [address] [place], [place] [address]    Culture   Final    FEW ESCHERICHIA COLI Confirmed Extended Spectrum Beta-Lactamase Producer (  ESBL).  In bloodstream infections from ESBL organisms, carbapenems are preferred over piperacillin /tazobactam. They are shown to have a lower risk of mortality.    Report Status 10/22/2023 FINAL  Final   Organism ID, Bacteria ESCHERICHIA COLI  Final      Susceptibility   Escherichia coli - MIC*    AMPICILLIN >=32 RESISTANT Resistant     CEFEPIME  4 INTERMEDIATE Intermediate     CEFTAZIDIME RESISTANT Resistant     CEFTRIAXONE >=64 RESISTANT Resistant     CIPROFLOXACIN  >=4 RESISTANT Resistant     GENTAMICIN  <=1 SENSITIVE Sensitive     IMIPENEM <=0.25 SENSITIVE Sensitive     TRIMETH /SULFA  <=20 SENSITIVE Sensitive     AMPICILLIN/SULBACTAM 4 SENSITIVE Sensitive     PIP/TAZO <=4 SENSITIVE Sensitive ug/mL    * FEW ESCHERICHIA COLI     Labs: BNP (last 3 results) Recent Labs    05/02/23 2205 05/22/23 1424 10/16/23 1028  BNP 349.9* 168.4* 1,435.7*   Basic Metabolic Panel: Recent Labs  Lab 10/18/23 0202 10/19/23 0358 10/20/23 0447 10/21/23 0452 10/22/23 0337  NA 133* 135 137 135 135  K 4.5 4.1 3.7 3.7 3.7  CL 106 104 105 105 104  CO2 19* 22 22 21* 21*  GLUCOSE 96 111* 96 92 99  BUN 57* 55* 56* 56* 55*  CREATININE 3.45* 3.43* 3.48* 3.44* 3.63*  CALCIUM  6.9* 7.3* 7.6* 7.4* 7.5*  MG 2.1 2.0 1.8 1.8 1.8   Liver Function Tests: Recent Labs  Lab 10/20/23 0447  AST 13*  ALT 16  ALKPHOS 60  BILITOT 1.2  PROT 4.7*  ALBUMIN  2.0*   No results for input(s): "LIPASE", "AMYLASE" in the last 168 hours. No results for input(s): "AMMONIA" in the last 168 hours. CBC: Recent Labs  Lab 10/18/23 0202 10/18/23 1831 10/19/23 0358 10/20/23 0447 10/21/23 0452 10/22/23 0337  WBC 2.0*  --  3.3* 3.0* 3.2* 3.4*  HGB  7.1* 9.3* 8.2* 8.2* 7.8* 8.0*  HCT 22.5* 28.6* 26.0* 25.0* 24.5* 24.6*  MCV 88.6  --  90.0 89.6 91.1 89.5  PLT 70*  --  79* 78* 94* 118*   Cardiac Enzymes: No results for input(s): "CKTOTAL", "CKMB", "CKMBINDEX", "TROPONINI" in the last 168 hours. BNP: Invalid input(s): "POCBNP" CBG: No results for input(s): "GLUCAP" in the last 168 hours. D-Dimer No results for input(s): "DDIMER" in the last 72 hours. Hgb A1c No results for input(s): "HGBA1C" in the last 72 hours. Lipid Profile No results for input(s): "CHOL", "HDL", "LDLCALC", "TRIG", "CHOLHDL", "LDLDIRECT" in the last 72 hours. Thyroid  function studies No results for input(s): "TSH", "T4TOTAL", "T3FREE", "THYROIDAB" in the last 72 hours.  Invalid input(s): "FREET3" Anemia work up No results for input(s): "VITAMINB12", "FOLATE", "FERRITIN", "TIBC", "IRON", "RETICCTPCT" in the last 72 hours. Urinalysis    Component Value Date/Time   COLORURINE YELLOW 09/21/2023 1814   APPEARANCEUR CLEAR 09/21/2023 1814   APPEARANCEUR Clear 08/04/2023 1507   LABSPEC 1.019 09/21/2023 1814   PHURINE 5.0 09/21/2023 1814   GLUCOSEU NEGATIVE 09/21/2023 1814   HGBUR NEGATIVE 09/21/2023 1814   BILIRUBINUR NEGATIVE 09/21/2023 1814   BILIRUBINUR Negative 08/04/2023 1507   KETONESUR NEGATIVE 09/21/2023 1814   PROTEINUR 100 (A) 09/21/2023 1814   NITRITE NEGATIVE 09/21/2023 1814   LEUKOCYTESUR NEGATIVE 09/21/2023 1814   Sepsis Labs Recent Labs  Lab 10/19/23 0358 10/20/23 0447 10/21/23 0452 10/22/23 0337  WBC 3.3* 3.0* 3.2* 3.4*   Microbiology Recent Results (from the past 240 hours)  Culture, blood (routine x 2)  Status: Abnormal   Collection Time: 10/16/23 10:32 AM   Specimen: BLOOD  Result Value Ref Range Status   Specimen Description   Final    BLOOD RIGHT ANTECUBITAL Performed at [facility], [address], [place], [place] [address]    Special Requests   Final    BOTTLES DRAWN AEROBIC AND ANAEROBIC Blood Culture  adequate volume Performed at [facility], [address] [place], [place] [address]    Culture  Setup Time   Final    GRAM NEGATIVE RODS AEROBIC BOTTLE ONLY CRITICAL RESULT CALLED TO, READ BACK BY AND VERIFIED WITHNoe Bath AT 0981 10/17/23 JG Performed at [facility], [address], [place], [place] [address]    Culture PSEUDOMONAS AERUGINOSA (A)  Final   Report Status 10/19/2023 FINAL  Final   Organism ID, Bacteria PSEUDOMONAS AERUGINOSA  Final      Susceptibility   Pseudomonas aeruginosa - MIC*    CEFTAZIDIME 2 SENSITIVE Sensitive     CIPROFLOXACIN  0.5 SENSITIVE Sensitive     GENTAMICIN  <=1 SENSITIVE Sensitive     IMIPENEM 2 SENSITIVE Sensitive     PIP/TAZO 8 SENSITIVE Sensitive ug/mL    CEFEPIME  2 SENSITIVE Sensitive     * PSEUDOMONAS AERUGINOSA  Blood Culture ID Panel (Reflexed)     Status: Abnormal   Collection Time: 10/16/23 10:32 AM  Result Value Ref Range Status   Enterococcus faecalis NOT DETECTED NOT DETECTED Final   Enterococcus Faecium NOT DETECTED NOT DETECTED Final   Listeria monocytogenes NOT DETECTED NOT DETECTED Final   Staphylococcus species NOT DETECTED NOT DETECTED Final   Staphylococcus aureus (BCID) NOT DETECTED NOT DETECTED Final   Staphylococcus epidermidis NOT DETECTED NOT DETECTED Final   Staphylococcus lugdunensis NOT DETECTED NOT DETECTED Final   Streptococcus species NOT DETECTED NOT DETECTED Final   Streptococcus agalactiae NOT DETECTED NOT DETECTED Final   Streptococcus pneumoniae NOT DETECTED NOT DETECTED Final   Streptococcus pyogenes NOT DETECTED NOT DETECTED Final   A.calcoaceticus-baumannii NOT DETECTED NOT DETECTED Final   Bacteroides fragilis NOT DETECTED NOT DETECTED Final   Enterobacterales NOT DETECTED NOT DETECTED Final   Enterobacter cloacae complex NOT DETECTED NOT DETECTED Final   Escherichia coli NOT DETECTED NOT DETECTED Final   Klebsiella aerogenes NOT DETECTED NOT DETECTED Final   Klebsiella oxytoca  NOT DETECTED NOT DETECTED Final   Klebsiella pneumoniae NOT DETECTED NOT DETECTED Final   Proteus species NOT DETECTED NOT DETECTED Final   Salmonella species NOT DETECTED NOT DETECTED Final   Serratia marcescens NOT DETECTED NOT DETECTED Final   Haemophilus influenzae NOT DETECTED NOT DETECTED Final   Neisseria meningitidis NOT DETECTED NOT DETECTED Final   Pseudomonas aeruginosa DETECTED (A) NOT DETECTED Final    Comment: CRITICAL RESULT CALLED TO, READ BACK BY AND VERIFIED WITH:  NATHAN BELUE AT 8295 10/17/23 JG    Stenotrophomonas maltophilia NOT DETECTED NOT DETECTED Final   Candida albicans NOT DETECTED NOT DETECTED Final   Candida auris NOT DETECTED NOT DETECTED Final   Candida glabrata NOT DETECTED NOT DETECTED Final   Candida krusei NOT DETECTED NOT DETECTED Final   Candida parapsilosis NOT DETECTED NOT DETECTED Final   Candida tropicalis NOT DETECTED NOT DETECTED Final   Cryptococcus neoformans/gattii NOT DETECTED NOT DETECTED Final   CTX-M ESBL NOT DETECTED NOT DETECTED Final   Carbapenem resistance IMP NOT DETECTED NOT DETECTED Final   Carbapenem resistance KPC NOT DETECTED NOT DETECTED Final   Carbapenem resistance NDM NOT DETECTED NOT  DETECTED Final   Carbapenem resistance VIM NOT DETECTED NOT DETECTED Final    Comment: Performed at [facility], [address] [place], [place] [address]  Resp panel by RT-PCR (RSV, Flu A&B, Covid) Anterior Nasal Swab     Status: None   Collection Time: 10/16/23  1:04 PM   Specimen: Anterior Nasal Swab  Result Value Ref Range Status   SARS Coronavirus 2 by RT PCR NEGATIVE NEGATIVE Final    Comment: (NOTE) SARS-CoV-2 target nucleic acids are NOT DETECTED.  The SARS-CoV-2 RNA is generally detectable in upper respiratory specimens during the acute phase of infection. The lowest concentration of SARS-CoV-2 viral copies this assay can detect is 138 copies/mL. A negative result does not preclude SARS-Cov-2 infection and should  not be used as the sole basis for treatment or other patient management decisions. A negative result may occur with  improper specimen collection/handling, submission of specimen other than nasopharyngeal swab, presence of viral mutation(s) within the areas targeted by this assay, and inadequate number of viral copies(<138 copies/mL). A negative result must be combined with clinical observations, patient history, and epidemiological information. The expected result is Negative.  Fact Sheet for Patients:  BloggerCourse.com  Fact Sheet for Healthcare Providers:  SeriousBroker.it  This test is no t yet approved or cleared by the [place]  FDA and  has been authorized for detection and/or diagnosis of SARS-CoV-2 by FDA under an Emergency Use Authorization (EUA). This EUA will remain  in effect (meaning this test can be used) for the duration of the COVID-19 declaration under Section 564(b)(1) of the Act, 21 U.S.C.section 360bbb-3(b)(1), unless the authorization is terminated  or revoked sooner.       Influenza A by PCR NEGATIVE NEGATIVE Final   Influenza B by PCR NEGATIVE NEGATIVE Final    Comment: (NOTE) The Xpert Xpress SARS-CoV-2/FLU/RSV plus assay is intended as an aid in the diagnosis of influenza from Nasopharyngeal swab specimens and should not be used as a sole basis for treatment. Nasal washings and aspirates are unacceptable for Xpert Xpress SARS-CoV-2/FLU/RSV testing.  Fact Sheet for Patients: BloggerCourse.com  Fact Sheet for Healthcare Providers: SeriousBroker.it  This test is not yet approved or cleared by the [place]  FDA and has been authorized for detection and/or diagnosis of SARS-CoV-2 by FDA under an Emergency Use Authorization (EUA). This EUA will remain in effect (meaning this test can be used) for the duration of the COVID-19 declaration under  Section 564(b)(1) of the Act, 21 U.S.C. section 360bbb-3(b)(1), unless the authorization is terminated or revoked.     Resp Syncytial Virus by PCR NEGATIVE NEGATIVE Final    Comment: (NOTE) Fact Sheet for Patients: BloggerCourse.com  Fact Sheet for Healthcare Providers: SeriousBroker.it  This test is not yet approved or cleared by the [place]  FDA and has been authorized for detection and/or diagnosis of SARS-CoV-2 by FDA under an Emergency Use Authorization (EUA). This EUA will remain in effect (meaning this test can be used) for the duration of the COVID-19 declaration under Section 564(b)(1) of the Act, 21 U.S.C. section 360bbb-3(b)(1), unless the authorization is terminated or revoked.  Performed at [facility], [address] [place], [place] [address]   Culture, blood (routine x 2)     Status: None   Collection Time: 10/17/23 12:07 AM   Specimen: BLOOD RIGHT ARM  Result Value Ref Range Status   Specimen Description BLOOD RIGHT ARM  Final   Special Requests   Final    BOTTLES DRAWN  AEROBIC AND ANAEROBIC Blood Culture adequate volume   Culture   Final    NO GROWTH 5 DAYS Performed at [facility], [address] [place], [place] [address]    Report Status 10/22/2023 FINAL  Final  MRSA Next Gen by PCR, Nasal     Status: None   Collection Time: 10/17/23  9:08 PM   Specimen: Nasal Mucosa; Nasal Swab  Result Value Ref Range Status   MRSA by PCR Next Gen NOT DETECTED NOT DETECTED Final    Comment: (NOTE) The GeneXpert MRSA Assay (FDA approved for NASAL specimens only), is one component of a comprehensive MRSA colonization surveillance program. It is not intended to diagnose MRSA infection nor to guide or monitor treatment for MRSA infections. Test performance is not FDA approved in patients less than 63 years old. Performed at [facility], [address] [place], [place] [address]    Culture, blood (Routine X 2) w Reflex to ID Panel     Status: None   Collection Time: 10/19/23  8:59 AM   Specimen: BLOOD LEFT HAND  Result Value Ref Range Status   Specimen Description BLOOD LEFT HAND  Final   Special Requests   Final    BOTTLES DRAWN AEROBIC AND ANAEROBIC Blood Culture adequate volume   Culture   Final    NO GROWTH 5 DAYS Performed at [facility], [address] [place], [place] [address]    Report Status 10/24/2023 FINAL  Final  Culture, blood (Routine X 2) w Reflex to ID Panel     Status: None   Collection Time: 10/19/23  9:06 AM   Specimen: BLOOD RIGHT HAND  Result Value Ref Range Status   Specimen Description BLOOD RIGHT HAND  Final   Special Requests   Final    BOTTLES DRAWN AEROBIC AND ANAEROBIC Blood Culture adequate volume   Culture   Final    NO GROWTH 5 DAYS Performed at [facility], [address] [place], [place] [address]    Report Status 10/24/2023 FINAL  Final  Expectorated Sputum Assessment w Gram Stain, Rflx to Resp Cult     Status: None   Collection Time: 10/19/23 10:20 PM   Specimen: Expectorated Sputum  Result Value Ref Range Status   Specimen Description EXPECTORATED SPUTUM  Final   Special Requests NONE  Final   Sputum evaluation   Final    THIS SPECIMEN IS ACCEPTABLE FOR SPUTUM CULTURE Performed at [facility], [address], [place], [place] [address]    Report Status 10/19/2023 FINAL  Final  Culture, Respiratory w Gram Stain     Status: None   Collection Time: 10/19/23 10:20 PM  Result Value Ref Range Status   Specimen Description   Final    EXPECTORATED SPUTUM Performed at [facility], [address], [place], [place] [address]    Special Requests   Final    NONE Reflexed from [phone] Performed at [facility], [address] [place], [place] [address]    Gram Stain   Final    RARE WBC PRESENT, PREDOMINANTLY PMN MODERATE GRAM POSITIVE COCCI MODERATE GRAM NEGATIVE  RODS Performed at [facility], [address] [place], [place] [address]    Culture   Final    FEW ESCHERICHIA COLI Confirmed Extended Spectrum Beta-Lactamase Producer (ESBL).  In bloodstream infections from ESBL organisms, carbapenems are preferred over piperacillin /tazobactam. They are shown to have a lower risk of mortality.  Report Status 10/22/2023 FINAL  Final   Organism ID, Bacteria ESCHERICHIA COLI  Final      Susceptibility   Escherichia coli - MIC*    AMPICILLIN >=32 RESISTANT Resistant     CEFEPIME  4 INTERMEDIATE Intermediate     CEFTAZIDIME RESISTANT Resistant     CEFTRIAXONE >=64 RESISTANT Resistant     CIPROFLOXACIN  >=4 RESISTANT Resistant     GENTAMICIN  <=1 SENSITIVE Sensitive     IMIPENEM <=0.25 SENSITIVE Sensitive     TRIMETH /SULFA  <=20 SENSITIVE Sensitive     AMPICILLIN/SULBACTAM 4 SENSITIVE Sensitive     PIP/TAZO <=4 SENSITIVE Sensitive ug/mL    * FEW ESCHERICHIA COLI   Imaging DG Chest Portable 1 View Result Date: 10/16/2023 CLINICAL DATA:  Shortness of breath EXAM: PORTABLE CHEST 1 VIEW COMPARISON:  Oct 06, 2023 FINDINGS: Worsening right lung infiltrates. Right lower lobe consolidation. Right small pleural effusion Prominence interstitial markings left lung. Findings could correlate with congestive changes superimposed right lower lobe pneumonia and right pleural effusion Total right shoulder arthroplasty IMPRESSION: Worsening right lung infiltrates. Right lower lobe consolidation. Right small pleural effusion. Findings could correlate with congestive changes superimposed right lower lobe pneumonia and right pleural effusion. Electronically Signed   By: Fredrich Jefferson M.D.   On: 10/16/2023 11:38      Time coordinating discharge: over 30 minutes  SIGNED:  Crewe Heathman DO [facility]

## 2023-10-24 NOTE — Progress Notes (Signed)
 Patient discharged home with family and belongings. Transported via wheelchair. All discharge instructions gone over with patient and family. Paperwork given to wife.

## 2023-10-24 NOTE — TOC Transition Note (Signed)
 Transition of Care [facility]) - Discharge Note   Patient Details  Name: [name] MRN: [id number] Date of Birth: 1940-10-05  [facility] CM/SW Contact:  Odilia Bennett, LCSW Phone Number: 10/24/2023, 4:50 PM   Clinical Narrative:   Patient will discharge home today after his antibiotics finish running. [employer] and [employer] are aware. No further concerns. CSW signing off.  Final next level of care: Home w Home Health Services Barriers to Discharge: Barriers Resolved   Patient Goals and CMS Choice            Discharge Placement                Patient to be transferred to facility by: Family   Patient and family notified of of transfer: 10/24/23  Discharge Plan and Services Additional resources added to the After Visit Summary for                            [facility] Arranged: RN, PT, OT, IV Antibiotics HH Agency: [facility], [employer] Date [facility] Agency Contacted: 10/24/23   Representative spoke with at [facility] Agency: [employer]: Mordechai April: Randel Buss  Social Drivers of Health (SDOH) Interventions SDOH Screenings   Food Insecurity: No Food Insecurity (10/16/2023)  Housing: Low Risk  (10/16/2023)  Transportation Needs: No Transportation Needs (10/16/2023)  Utilities: Not At Risk (10/16/2023)  Alcohol Screen: Low Risk  (07/12/2021)  Depression (PHQ2-9): Low Risk  (04/04/2023)  Financial Resource Strain: Low Risk  (07/12/2021)  Physical Activity: Inactive (07/12/2021)  Social Connections: Socially Integrated (10/16/2023)  Stress: No Stress Concern Present (07/12/2021)  Tobacco Use: Medium Risk (10/16/2023)  Health Literacy: Medium Risk (06/09/2023)   Received from [facility]     Readmission Risk Interventions    10/17/2023    3:18 PM 04/21/2023   10:16 AM  Readmission Risk Prevention Plan  Transportation Screening Complete Complete  HRI or Home Care Consult  Complete  Social Work Consult for Recovery Care Planning/Counseling  Complete  Palliative  Care Screening  Not Applicable  Medication Review Oceanographer) Complete Complete  PCP or Specialist appointment within 3-5 days of discharge Complete   SW Recovery Care/Counseling Consult Complete   Palliative Care Screening Not Applicable   Skilled Nursing Facility Not Applicable

## 2023-10-24 NOTE — Procedures (Signed)
 PROCEDURE SUMMARY:  Successful US  guided right thoracentesis. Yielded of dark amber fluid. Patient tolerated procedure well. No immediate complications. EBL = trace  Post procedure chest X-ray reveals no pneumothorax  Merlene Dante Alonzo Artis PA-C 10/24/2023 3:21 PM

## 2023-10-24 NOTE — Plan of Care (Signed)

## 2023-10-25 ENCOUNTER — Telehealth: Payer: Self-pay

## 2023-10-25 ENCOUNTER — Inpatient Hospital Stay

## 2023-10-25 DIAGNOSIS — I4892 Unspecified atrial flutter: Secondary | ICD-10-CM | POA: Diagnosis not present

## 2023-10-25 DIAGNOSIS — Z1612 Extended spectrum beta lactamase (ESBL) resistance: Secondary | ICD-10-CM | POA: Diagnosis not present

## 2023-10-25 DIAGNOSIS — I4819 Other persistent atrial fibrillation: Secondary | ICD-10-CM | POA: Diagnosis not present

## 2023-10-25 DIAGNOSIS — Z8616 Personal history of COVID-19: Secondary | ICD-10-CM | POA: Diagnosis not present

## 2023-10-25 DIAGNOSIS — J158 Pneumonia due to other specified bacteria: Secondary | ICD-10-CM | POA: Diagnosis not present

## 2023-10-25 DIAGNOSIS — D61818 Other pancytopenia: Secondary | ICD-10-CM | POA: Diagnosis not present

## 2023-10-25 DIAGNOSIS — Z792 Long term (current) use of antibiotics: Secondary | ICD-10-CM | POA: Diagnosis not present

## 2023-10-25 DIAGNOSIS — N400 Enlarged prostate without lower urinary tract symptoms: Secondary | ICD-10-CM | POA: Diagnosis not present

## 2023-10-25 DIAGNOSIS — A4151 Sepsis due to Escherichia coli [E. coli]: Secondary | ICD-10-CM | POA: Diagnosis not present

## 2023-10-25 DIAGNOSIS — I5031 Acute diastolic (congestive) heart failure: Secondary | ICD-10-CM | POA: Diagnosis not present

## 2023-10-25 DIAGNOSIS — D7581 Myelofibrosis: Secondary | ICD-10-CM | POA: Diagnosis not present

## 2023-10-25 DIAGNOSIS — N179 Acute kidney failure, unspecified: Secondary | ICD-10-CM | POA: Diagnosis not present

## 2023-10-25 DIAGNOSIS — G3184 Mild cognitive impairment, so stated: Secondary | ICD-10-CM | POA: Diagnosis not present

## 2023-10-25 DIAGNOSIS — S066XAD Traumatic subarachnoid hemorrhage with loss of consciousness status unknown, subsequent encounter: Secondary | ICD-10-CM | POA: Diagnosis not present

## 2023-10-25 DIAGNOSIS — Z85820 Personal history of malignant melanoma of skin: Secondary | ICD-10-CM | POA: Diagnosis not present

## 2023-10-25 DIAGNOSIS — E782 Mixed hyperlipidemia: Secondary | ICD-10-CM | POA: Diagnosis not present

## 2023-10-25 DIAGNOSIS — N184 Chronic kidney disease, stage 4 (severe): Secondary | ICD-10-CM | POA: Diagnosis not present

## 2023-10-25 DIAGNOSIS — Z87442 Personal history of urinary calculi: Secondary | ICD-10-CM | POA: Diagnosis not present

## 2023-10-25 DIAGNOSIS — Z452 Encounter for adjustment and management of vascular access device: Secondary | ICD-10-CM | POA: Diagnosis not present

## 2023-10-25 DIAGNOSIS — D631 Anemia in chronic kidney disease: Secondary | ICD-10-CM | POA: Diagnosis not present

## 2023-10-25 DIAGNOSIS — Z8673 Personal history of transient ischemic attack (TIA), and cerebral infarction without residual deficits: Secondary | ICD-10-CM | POA: Diagnosis not present

## 2023-10-25 DIAGNOSIS — E039 Hypothyroidism, unspecified: Secondary | ICD-10-CM | POA: Diagnosis not present

## 2023-10-25 DIAGNOSIS — K219 Gastro-esophageal reflux disease without esophagitis: Secondary | ICD-10-CM | POA: Diagnosis not present

## 2023-10-25 DIAGNOSIS — J151 Pneumonia due to Pseudomonas: Secondary | ICD-10-CM | POA: Diagnosis not present

## 2023-10-25 DIAGNOSIS — I130 Hypertensive heart and chronic kidney disease with heart failure and stage 1 through stage 4 chronic kidney disease, or unspecified chronic kidney disease: Secondary | ICD-10-CM | POA: Diagnosis not present

## 2023-10-25 NOTE — Telephone Encounter (Signed)
 Per [name] "No revlimid  at this time. I will put in rx plan for luspatercept  for him".  No further action needed at this time.

## 2023-10-25 NOTE — Telephone Encounter (Signed)
 [name] we received a Revlimid  refill request for this patient via fax.  In their chart their Revlimid  was paused by Melodi Sprung, DO on 10/23/23 with the comments section reading "Resume when instructed by oncology".  Per last OV note dated 08/30/23 it reads "I would therefore like to switch him to Revlimid  5 mg 3 weeks on and 1 week off to see if it will help him with his anemia especially given that he also has underlying splenomegaly.  I have lowered his dose to account for his renal functions.".  Just wanted to touch base with you first prior to putting refill order in.  [employer] Auth #   [id number]   Date Obtained: 10/25/23

## 2023-10-25 NOTE — Telephone Encounter (Signed)
 No revlimid  at this time. I will put in rx plan for luspatercept  for him

## 2023-10-26 ENCOUNTER — Other Ambulatory Visit: Payer: Self-pay

## 2023-10-26 ENCOUNTER — Telehealth: Payer: Self-pay

## 2023-10-26 ENCOUNTER — Other Ambulatory Visit: Payer: Self-pay | Admitting: Oncology

## 2023-10-26 DIAGNOSIS — D469 Myelodysplastic syndrome, unspecified: Secondary | ICD-10-CM

## 2023-10-26 DIAGNOSIS — B965 Pseudomonas (aeruginosa) (mallei) (pseudomallei) as the cause of diseases classified elsewhere: Secondary | ICD-10-CM

## 2023-10-26 NOTE — Telephone Encounter (Signed)
 Patient will need to be scheduled for at [facility] IR to have his tunneled CVC removed after end of IV abx on 10/30/23. The scheduler for IR is currently out and them radiologist asked that I call back on Monday to schedule the patient. Order for CVC removal placed.   Patient's wife [name] advised of plan with getting patient scheduled and verbalized understanding I will call her with an update.  Sherrick Araki Adel Holt, CMA

## 2023-10-27 ENCOUNTER — Other Ambulatory Visit: Payer: Self-pay

## 2023-10-31 ENCOUNTER — Telehealth: Payer: Self-pay

## 2023-10-31 DIAGNOSIS — J9601 Acute respiratory failure with hypoxia: Secondary | ICD-10-CM

## 2023-10-31 DIAGNOSIS — J189 Pneumonia, unspecified organism: Secondary | ICD-10-CM

## 2023-11-01 ENCOUNTER — Telehealth: Payer: Self-pay

## 2023-11-01 ENCOUNTER — Inpatient Hospital Stay

## 2023-11-01 ENCOUNTER — Inpatient Hospital Stay: Admitting: Oncology

## 2023-11-01 ENCOUNTER — Telehealth: Payer: Self-pay | Admitting: Oncology

## 2023-11-01 NOTE — Progress Notes (Signed)
 Complex Care Management Note Care Guide Note  11/01/2023 Name: [name] MRN: [id number] DOB: [birth year]   Complex Care Management Outreach Attempts: An unsuccessful telephone outreach was attempted today to offer the patient information about available complex care management services.  Follow Up Plan:  Additional outreach attempts will be made to offer the patient complex care management information and services.   Encounter Outcome:  No Answer  Lenton Rail , RMA     [place]  [facility] Guide  Direct Dial: [phone]  Website: [email or web address]

## 2023-11-01 NOTE — Telephone Encounter (Signed)
 I can see him on 6/4

## 2023-11-01 NOTE — Telephone Encounter (Signed)
 Per Leary Provencal with IR scheduling patient is scheduled to have tunneled cvc removed on 11/03/23 per patient's daughter request.  Estephania Licciardi Adel Holt, CMA

## 2023-11-01 NOTE — Telephone Encounter (Signed)
 Patients daughter called to cancel appointments for today. She states he fell and has a lot of involuntary twitching. She states she can not get him to the doctor today. I have rescheduled his appointment to 6/6. She is asking if he needs to be seen sooner? Please call [phone]  [facility].

## 2023-11-02 ENCOUNTER — Inpatient Hospital Stay

## 2023-11-02 ENCOUNTER — Inpatient Hospital Stay: Admitting: Family Medicine

## 2023-11-02 ENCOUNTER — Telehealth: Payer: Self-pay

## 2023-11-02 NOTE — Progress Notes (Signed)
 Pharmacist Chemotherapy Monitoring - Initial Assessment    Anticipated start date: 11/08/23   The following has been reviewed per standard work regarding the patient's treatment regimen: The patient's diagnosis, treatment plan and drug doses, and organ/hematologic function Lab orders and baseline tests specific to treatment regimen  The treatment plan start date, drug sequencing, and pre-medications Prior authorization status  Patient's documented medication list, including drug-drug interaction screen and prescriptions for anti-emetics and supportive care specific to the treatment regimen The drug concentrations, fluid compatibility, administration routes, and timing of the medications to be used The patient's access for treatment and lifetime cumulative dose history, if applicable  The patient's medication allergies and previous infusion related reactions, if applicable   Changes made to treatment plan:  N/A  Follow up needed:  F/u plans for thromboembolic ppx: 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

## 2023-11-02 NOTE — Telephone Encounter (Signed)
 Copied from CRM [id number]. Topic: Clinical - Lab/Test Results >> Nov 01, 2023 12:52 PM Marissa P wrote: Reason for CRM: [facility] called to provide his drug interactions amiodarone  200mg  tablet, interacts with simvastin 40mg  tablet level one interaction, amiodarone  200 mg tablet interacts with quetiapine  45mg  tablet level 2 interaction please call in regards to this and or further questions [phone]

## 2023-11-02 NOTE — Progress Notes (Signed)
 Patient for IR IV Tun Cath removal on Friday 11/03/23, I called and spoke with the patient's daughter, on the phone and gave pre-procedure instructions. Pt's daughter was made aware to have the patient here at 2p and check in at the new entrance. The patient's daughter stated understanding.  Called 11/01/23

## 2023-11-03 ENCOUNTER — Ambulatory Visit
Admission: RE | Admit: 2023-11-03 | Discharge: 2023-11-03 | Disposition: A | Discharge status: Home / Self Care | Source: Ambulatory Visit | Attending: Infectious Diseases | Admitting: Infectious Diseases

## 2023-11-04 DIAGNOSIS — J158 Pneumonia due to other specified bacteria: Secondary | ICD-10-CM | POA: Diagnosis not present

## 2023-11-04 DIAGNOSIS — I5031 Acute diastolic (congestive) heart failure: Secondary | ICD-10-CM | POA: Diagnosis not present

## 2023-11-04 DIAGNOSIS — Z452 Encounter for adjustment and management of vascular access device: Secondary | ICD-10-CM | POA: Diagnosis not present

## 2023-11-04 DIAGNOSIS — I130 Hypertensive heart and chronic kidney disease with heart failure and stage 1 through stage 4 chronic kidney disease, or unspecified chronic kidney disease: Secondary | ICD-10-CM | POA: Diagnosis not present

## 2023-11-04 DIAGNOSIS — Z1612 Extended spectrum beta lactamase (ESBL) resistance: Secondary | ICD-10-CM | POA: Diagnosis not present

## 2023-11-04 DIAGNOSIS — A4151 Sepsis due to Escherichia coli [E. coli]: Secondary | ICD-10-CM | POA: Diagnosis not present

## 2023-11-06 ENCOUNTER — Other Ambulatory Visit: Payer: Self-pay | Admitting: Radiology

## 2023-11-06 NOTE — Progress Notes (Signed)
 Patient for IR Tun Cath Removal on Tues 11/07/23, I called and spoke with the patient's daughter, [name] on the phone and gave pre-procedure instructions. [name] was made aware to have the patient here at 2:30p and check in at the new entrance. [name] stated understanding.  Called 11/03/23

## 2023-11-07 ENCOUNTER — Other Ambulatory Visit: Payer: Self-pay | Admitting: Infectious Diseases

## 2023-11-07 ENCOUNTER — Ambulatory Visit: Payer: Self-pay

## 2023-11-07 ENCOUNTER — Ambulatory Visit
Admission: RE | Admit: 2023-11-07 | Discharge: 2023-11-07 | Disposition: A | Discharge status: Home / Self Care | Source: Ambulatory Visit | Attending: Infectious Diseases | Admitting: Infectious Diseases

## 2023-11-07 DIAGNOSIS — R7881 Bacteremia: Secondary | ICD-10-CM | POA: Diagnosis not present

## 2023-11-07 DIAGNOSIS — B965 Pseudomonas (aeruginosa) (mallei) (pseudomallei) as the cause of diseases classified elsewhere: Secondary | ICD-10-CM | POA: Insufficient documentation

## 2023-11-07 DIAGNOSIS — Z452 Encounter for adjustment and management of vascular access device: Secondary | ICD-10-CM | POA: Diagnosis not present

## 2023-11-07 HISTORY — PX: IR REMOVAL TUN CV CATH W/O FL: IMG2289

## 2023-11-07 NOTE — Procedures (Signed)
 Removal of 49F tunneled central line. Patient was prepped with Chlorhexidine  and draped in the standard sterile fashion. Suture was cut and pulled, gentle traction was applied and the catheter in its entirety was removed. No bleeding noted at the time. Sterile gauze and tegaderm dressing was applied. Successful tunneled central line removal.

## 2023-11-07 NOTE — Telephone Encounter (Signed)
 Information obtained from wife [name]. Wife calling to see when pcp appt is this week. Pt states that she thought he had a F/U this week, no appts were located in the chart for next 2 weeks with PCP. There was a "no show" on 5/29. Initially pt wife reports that pt has had hallucinations since fall 1 month ago, as call progressed wife states that he woke with the hallucinations this morning.  Chief Complaint: hallucinations Frequency: 7 months Pertinent Negatives: Patient denies odorous urine, hematuria, fever, difficulty breathing  Additional Notes: Wife states that he can't walk since the fall. Wife would like the pt to have PT and HH. Wife states that she needs to call NT back.  No disposition nor advice given. CAL contacted, RN spoke to [place], she will be speaking to PCP.    Copied from CRM [id number]. Topic: Clinical - Red Word Triage >> Nov 07, 2023  2:31 PM [place] B wrote: Pt has hallucinations Answer Assessment - Initial Assessment Questions 1. LEVEL OF CONSCIOUSNESS: "How is he (she, the patient) acting right now?" (e.g., alert-oriented, confused, lethargic, stuporous, comatose)     Wife states that he is alert and knows his DOB and her DOB, but has been asking about the trains and trips that are not occurring.  2. ONSET: "When did the confusion start?"  (minutes, hours, days)     Woke this morning with hallucination 3. PATTERN "Does this come and go, or has it been constant since it started?"  "Is it present now?"     constant 4. ALCOHOL or DRUGS: "Has he been drinking alcohol or taking any drugs?"      denies 5. NARCOTIC MEDICINES: "Has he been receiving any narcotic medications?" (e.g., morphine , Vicodin)     denies 6. CAUSE: "What do you think is causing the confusion?"      Hx of UTIs 7. OTHER SYMPTOMS: "Are there any other symptoms?" (e.g., difficulty breathing, headache, fever, weakness)     Caller states that she does not believe that there are any new s/s but states that he has  not been walking since his fall/head bleed about a month ago.  Protocols used: Confusion - Delirium-A-AH

## 2023-11-08 ENCOUNTER — Inpatient Hospital Stay: Attending: Oncology | Admitting: Hospice and Palliative Medicine

## 2023-11-08 ENCOUNTER — Inpatient Hospital Stay: Attending: Oncology

## 2023-11-08 ENCOUNTER — Telehealth: Payer: Self-pay

## 2023-11-08 ENCOUNTER — Inpatient Hospital Stay: Admitting: Oncology

## 2023-11-08 ENCOUNTER — Telehealth: Payer: Self-pay | Admitting: Oncology

## 2023-11-08 ENCOUNTER — Telehealth: Payer: Self-pay | Admitting: *Deleted

## 2023-11-08 ENCOUNTER — Inpatient Hospital Stay

## 2023-11-08 DIAGNOSIS — Z452 Encounter for adjustment and management of vascular access device: Secondary | ICD-10-CM | POA: Diagnosis not present

## 2023-11-08 DIAGNOSIS — D469 Myelodysplastic syndrome, unspecified: Secondary | ICD-10-CM | POA: Diagnosis not present

## 2023-11-08 DIAGNOSIS — A4151 Sepsis due to Escherichia coli [E. coli]: Secondary | ICD-10-CM | POA: Diagnosis not present

## 2023-11-08 DIAGNOSIS — I130 Hypertensive heart and chronic kidney disease with heart failure and stage 1 through stage 4 chronic kidney disease, or unspecified chronic kidney disease: Secondary | ICD-10-CM | POA: Diagnosis not present

## 2023-11-08 DIAGNOSIS — I5031 Acute diastolic (congestive) heart failure: Secondary | ICD-10-CM | POA: Diagnosis not present

## 2023-11-08 DIAGNOSIS — Z1612 Extended spectrum beta lactamase (ESBL) resistance: Secondary | ICD-10-CM | POA: Diagnosis not present

## 2023-11-08 DIAGNOSIS — J158 Pneumonia due to other specified bacteria: Secondary | ICD-10-CM | POA: Diagnosis not present

## 2023-11-08 NOTE — Telephone Encounter (Signed)
 Spoke to patients wife to check on him because he missed the appts this morning. She stated that he is unable to walk, cannot even stand. He is hallucinating and isn't making any sense. She said she wouldn't be able to get him to his appts. I told her I would let [name] and the team know. Please advise

## 2023-11-08 NOTE — Telephone Encounter (Signed)
 Can you call his wife and see how we can support him?

## 2023-11-08 NOTE — Telephone Encounter (Signed)
 OK for verbal orders?

## 2023-11-08 NOTE — Telephone Encounter (Signed)
 [name] had asked [name] if she can go in and speak about conversation with the family about hospice. [name] did talk to them about it.  [name] needs to speak [name] and he is not here this afternoon so I will leave it for him in the morning

## 2023-11-08 NOTE — Progress Notes (Signed)
 Virtual Visit via Telephone Note  I connected with [name] on 11/08/23 at 11:45 AM EDT by telephone and verified that I am speaking with the correct person using two identifiers.  Location: Patient: Home Provider: Clinic   I discussed the limitations, risks, security and privacy concerns of performing an evaluation and management service by telephone and the availability of in person appointments. I also discussed with the patient that there may be a patient responsible charge related to this service. The patient expressed understanding and agreed to proceed.   History of Present Illness: [name] is a 83 y.o. male with multiple medical problems including myelofibrosis, symptomatic anemia, A-fib.  Patient has been hospitalized multiple times since November 2024, most recently 09/29/2023 to 10/10/2023 with subarachnoid hemorrhage after a fall and again readmitted 10/16/2023 - 10/24/2023 with sepsis from pneumonia.     Observations/Objective: I spoke with patient's wife by phone after she canceled follow-up appointment scheduled today with [name].  Reportedly, patient is doing poorly at home.  He is mostly bed/chair bound and unable to ambulate.  Oral intake is poor.  Wife says that patient no longer wishes to go back to the hospital and just wants to stay home and focus on comfort.  She says she feels that patient is likely nearing end-of-life.  We discussed the option of hospice involvement at home.  I also spoke with dementia program coordinator from [facility] who was in the home evaluating patient for services. She had also had a discussion with family about hospice involvement. Wife agreed with hospice referral.   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 Referral to hospice per request of patient's wife  Case and plan discussed with [name]  Follow Up  Instructions: As needed   I discussed the assessment and treatment plan with the patient. The patient was provided an opportunity to ask questions and all were answered. The patient agreed with the plan and demonstrated an understanding of the instructions.   The patient was advised to call back or seek an in-person evaluation if the symptoms worsen or if the condition fails to improve as anticipated.  I provided 20 minutes of non-face-to-face time during this encounter.   Peggyann Bower, NP

## 2023-11-08 NOTE — Telephone Encounter (Unsigned)
 Copied from CRM [id number]. Topic: Clinical - Home Health Verbal Orders >> Nov 07, 2023 10:48 AM [name] wrote: Caller/Agency: [facility] Callback Number: [phone] Service Requested: Physical Therapy Frequency: [name] is requesting a PT Eval Any new concerns about the patient? Yes

## 2023-11-09 ENCOUNTER — Ambulatory Visit: Attending: Medical | Admitting: Medical

## 2023-11-09 DIAGNOSIS — N184 Chronic kidney disease, stage 4 (severe): Secondary | ICD-10-CM | POA: Diagnosis not present

## 2023-11-09 DIAGNOSIS — D649 Anemia, unspecified: Secondary | ICD-10-CM | POA: Diagnosis not present

## 2023-11-09 DIAGNOSIS — F32A Depression, unspecified: Secondary | ICD-10-CM | POA: Diagnosis not present

## 2023-11-09 DIAGNOSIS — I509 Heart failure, unspecified: Secondary | ICD-10-CM | POA: Diagnosis not present

## 2023-11-09 DIAGNOSIS — M810 Age-related osteoporosis without current pathological fracture: Secondary | ICD-10-CM | POA: Diagnosis not present

## 2023-11-09 DIAGNOSIS — N400 Enlarged prostate without lower urinary tract symptoms: Secondary | ICD-10-CM | POA: Diagnosis not present

## 2023-11-09 DIAGNOSIS — E039 Hypothyroidism, unspecified: Secondary | ICD-10-CM | POA: Diagnosis not present

## 2023-11-09 DIAGNOSIS — K2100 Gastro-esophageal reflux disease with esophagitis, without bleeding: Secondary | ICD-10-CM | POA: Diagnosis not present

## 2023-11-09 DIAGNOSIS — D469 Myelodysplastic syndrome, unspecified: Secondary | ICD-10-CM | POA: Diagnosis not present

## 2023-11-09 DIAGNOSIS — I4821 Permanent atrial fibrillation: Secondary | ICD-10-CM | POA: Diagnosis not present

## 2023-11-09 NOTE — Telephone Encounter (Signed)
Called and gave verbal order per Dr. Johnson.  °

## 2023-11-09 NOTE — Progress Notes (Deleted)
  Cardiology Office Note   Date:  11/09/2023  ID:  [name], DOB 08-26-1940, MRN [id number] PCP: [name], DO  [facility] Providers Cardiologist:  Belva Boyden, MD { Click to update primary MD,subspecialty MD or APP then REFRESH:1}    History of Present Illness [name] is a 83 y.o. male with a history of myelofibrosis and anemia followed by heme-onc, CKD stage 3-4, HTN, HLD, PAD, and CVA who is being seen for follow-up.   The patient was admitted 11/26-12/3/24 at [facility] [place] for acute pericarditis/myocarditis, HFmrEF, Afib RVR, AKI and acute on chronic anemia. Cath showed relatively normal coronary arteries. Echo showed EF 40-45% with global HK. Limimted echo 12/1 showed EF 45-50%, small pericardial effusion. He was started on prednisone  given AKI/CKD. ESR 127>>>74. Also had new dx of Afib with rates in the low 100s. He was started on Eliquis  2.5mg  BID. The patient self-diuresed.    He was admitted at [facility] 12/17-12/27/24 for pericardial effusion, pleural effusion, acute HF, slurred speech,AKI on CKD stage 4/nephrotic range proteinuria, typical aflutter and pericarditis.  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 Pt improved with high dose steroids and diuresis. He additionally had thoracentesis for exudative pleural effusion. He had attempt at cardioversion 12/19 with return to NSR but back into aflutter s/p DCCV for atrial flutter 12/26 with conversion to NSR with 1st degree AV block, started on amiodarone . MRI head 12/17 non-acute. He was discharged on lasix  40mg  daily. He was discharged to a rehab facility.  Patient was seen 1/28/202020 evaluation for kidney biopsy.  Repeat limited echo.  CBC showed hemoglobin 6.9 and it was recommended he stop Eliquis  and return to heme/oncology for infusion.  He underwent 1 unit PRBCs on 07/05/2023.  Repeat hemoglobin 7.1.  Case  was discussed with Dr. Gollan who felt it was okay to pursue kidney biopsy.    The patient was admitted 4/17-4/25 for subarachnoid hemorrhage following injury.  He had a large right pleural effusion status postthoracentesis yielding 1.6 L.  Acute improving CKD stage IV.  Creatinine went up to 5.01.  Patient was readmitted 10/16/2023 with severe sepsis, right lower lobe pneumonia, symptomatic anemia, bacteremia due to Pseudomonas, right effusion s/p thoracentesis removing 450 mL.  Patient was discharged home.  ROS: ***  Studies Reviewed      *** Risk Assessment/Calculations {Does this patient have ATRIAL FIBRILLATION?[phone]} No BP recorded.  {Refresh Note OR Click here to enter BP  :1}***       Physical Exam VS:  There were no vitals taken for this visit.   Wt Readings from Last 3 Encounters:  10/10/23 165 lb 2 oz (74.9 kg)  09/21/23 160 lb (72.6 kg)  08/30/23 168 lb 4.8 oz (76.3 kg)    GEN: Well nourished, well developed in no acute distress NECK: No JVD; No carotid bruits CARDIAC: ***RRR, no murmurs, rubs, gallops RESPIRATORY:  Clear to auscultation without rales, wheezing or rhonchi  ABDOMEN: Soft, non-tender, non-distended EXTREMITIES:  No edema; No deformity   ASSESSMENT AND PLAN ***    {Are you ordering a CV Procedure (e.g. stress test, cath, DCCV, TEE, etc)?   Press F2        :[id number]}  Dispo: ***  Signed, Mariam Helbert Rebekah Canada, PA-C

## 2023-11-10 ENCOUNTER — Ambulatory Visit

## 2023-11-10 ENCOUNTER — Ambulatory Visit: Admitting: Oncology

## 2023-11-10 ENCOUNTER — Other Ambulatory Visit

## 2023-11-10 DIAGNOSIS — I4821 Permanent atrial fibrillation: Secondary | ICD-10-CM | POA: Diagnosis not present

## 2023-11-10 DIAGNOSIS — E039 Hypothyroidism, unspecified: Secondary | ICD-10-CM | POA: Diagnosis not present

## 2023-11-10 DIAGNOSIS — M810 Age-related osteoporosis without current pathological fracture: Secondary | ICD-10-CM | POA: Diagnosis not present

## 2023-11-10 DIAGNOSIS — D469 Myelodysplastic syndrome, unspecified: Secondary | ICD-10-CM | POA: Diagnosis not present

## 2023-11-10 DIAGNOSIS — I509 Heart failure, unspecified: Secondary | ICD-10-CM | POA: Diagnosis not present

## 2023-11-10 DIAGNOSIS — K2100 Gastro-esophageal reflux disease with esophagitis, without bleeding: Secondary | ICD-10-CM | POA: Diagnosis not present

## 2023-11-10 NOTE — Progress Notes (Signed)
 Complex Care Management Note  Care Guide Note 11/10/2023 Name: [name] MRN: [id number] DOB: 1941-01-31  [name] is a 83 y.o. year old male who sees [name], DO for primary care. I reached out to Waylon Hahn by phone today to offer complex care management services.  [name] was given information about Complex Care Management services today including:   The Complex Care Management services include support from the care team which includes your Nurse Care Manager, Clinical Social Worker, or Pharmacist.  The Complex Care Management team is here to help remove barriers to the health concerns and goals most important to you. Complex Care Management services are voluntary, and the patient may decline or stop services at any time by request to their care team member.   Complex Care Management Consent Status: Patient agreed to services and verbal consent obtained. Declined scheduling with LCSW   Follow up plan:  Telephone appointment with complex care management team member scheduled for:  [facility] 11/14/2023  Encounter Outcome:  Patient Scheduled  Lenton Rail , RMA     [place]  [facility], [facility] Psychiatric Guide  Direct Dial: [phone]  Website: [email or web address]

## 2023-11-13 ENCOUNTER — Ambulatory Visit: Admitting: Dermatology

## 2023-11-13 DIAGNOSIS — E039 Hypothyroidism, unspecified: Secondary | ICD-10-CM | POA: Diagnosis not present

## 2023-11-13 DIAGNOSIS — K2100 Gastro-esophageal reflux disease with esophagitis, without bleeding: Secondary | ICD-10-CM | POA: Diagnosis not present

## 2023-11-13 DIAGNOSIS — I4821 Permanent atrial fibrillation: Secondary | ICD-10-CM | POA: Diagnosis not present

## 2023-11-13 DIAGNOSIS — I509 Heart failure, unspecified: Secondary | ICD-10-CM | POA: Diagnosis not present

## 2023-11-13 DIAGNOSIS — M810 Age-related osteoporosis without current pathological fracture: Secondary | ICD-10-CM | POA: Diagnosis not present

## 2023-11-13 DIAGNOSIS — D469 Myelodysplastic syndrome, unspecified: Secondary | ICD-10-CM | POA: Diagnosis not present

## 2023-11-14 ENCOUNTER — Other Ambulatory Visit: Payer: Self-pay

## 2023-11-14 NOTE — Telephone Encounter (Signed)
 PCP aware, no further actions needed.

## 2023-11-14 NOTE — Patient Instructions (Signed)
 Visit Information  Thank you for taking time to visit with me today. As a nurse case manager that partners with your primary care doctor's office, I will step back from calling you while you are under Hospice care with [facility].  You will have access to their nurse case managers and should call them at the number they provided or contact Dr. Lincoln Renshaw at [facility] for further needs.   no further scheduled appointments.   Please call the care guide team at [phone] if you need any further assistance or decide to not be managed by Hospice care.     Please call the [place]  National Suicide Prevention Lifeline: 8633355522 or TTY: (931)782-6288 TTY [phone]) to talk to a trained counselor if you are experiencing a Mental Health or Behavioral Health Crisis or need someone to talk to.   Jurline Olmsted BSN, CCM [place]  VBCI Population Health RN Care Manager Direct Dial: [phone]  Fax: [phone]

## 2023-11-14 NOTE — Patient Outreach (Signed)
 Spoke with caregiver/wife, states patient is receiving Hospice care with [facility], first visit was 11/09/23.  This RNCM informed her that this CM will step back since patient has case manager, Aletha Anderson, with [facility] but to please call them or PCP office for any concerns. She did mention patient has a painful 5th toe on left foot and "someone was to send some cream".  This RNCM called [facility] to relay the message, I left a voicemail to [place] for return call.

## 2023-11-15 DIAGNOSIS — I509 Heart failure, unspecified: Secondary | ICD-10-CM | POA: Diagnosis not present

## 2023-11-15 DIAGNOSIS — I4821 Permanent atrial fibrillation: Secondary | ICD-10-CM | POA: Diagnosis not present

## 2023-11-15 DIAGNOSIS — D469 Myelodysplastic syndrome, unspecified: Secondary | ICD-10-CM | POA: Diagnosis not present

## 2023-11-15 DIAGNOSIS — E039 Hypothyroidism, unspecified: Secondary | ICD-10-CM | POA: Diagnosis not present

## 2023-11-15 DIAGNOSIS — M810 Age-related osteoporosis without current pathological fracture: Secondary | ICD-10-CM | POA: Diagnosis not present

## 2023-11-15 DIAGNOSIS — K2100 Gastro-esophageal reflux disease with esophagitis, without bleeding: Secondary | ICD-10-CM | POA: Diagnosis not present

## 2023-11-21 DIAGNOSIS — D469 Myelodysplastic syndrome, unspecified: Secondary | ICD-10-CM | POA: Diagnosis not present

## 2023-11-21 DIAGNOSIS — M810 Age-related osteoporosis without current pathological fracture: Secondary | ICD-10-CM | POA: Diagnosis not present

## 2023-11-21 DIAGNOSIS — E039 Hypothyroidism, unspecified: Secondary | ICD-10-CM | POA: Diagnosis not present

## 2023-11-21 DIAGNOSIS — I4821 Permanent atrial fibrillation: Secondary | ICD-10-CM | POA: Diagnosis not present

## 2023-11-21 DIAGNOSIS — I509 Heart failure, unspecified: Secondary | ICD-10-CM | POA: Diagnosis not present

## 2023-11-21 DIAGNOSIS — K2100 Gastro-esophageal reflux disease with esophagitis, without bleeding: Secondary | ICD-10-CM | POA: Diagnosis not present

## 2023-11-22 ENCOUNTER — Other Ambulatory Visit: Payer: Self-pay

## 2023-11-22 ENCOUNTER — Inpatient Hospital Stay

## 2023-11-22 ENCOUNTER — Inpatient Hospital Stay: Admitting: Oncology

## 2023-11-22 ENCOUNTER — Encounter: Payer: Self-pay | Admitting: Oncology

## 2023-11-22 VITALS — BP 113/50 | HR 86 | Temp 96.1°F | Resp 16 | Wt 146.0 lb

## 2023-11-22 DIAGNOSIS — D469 Myelodysplastic syndrome, unspecified: Secondary | ICD-10-CM

## 2023-11-22 DIAGNOSIS — E039 Hypothyroidism, unspecified: Secondary | ICD-10-CM | POA: Diagnosis not present

## 2023-11-22 DIAGNOSIS — M810 Age-related osteoporosis without current pathological fracture: Secondary | ICD-10-CM | POA: Diagnosis not present

## 2023-11-22 DIAGNOSIS — K2100 Gastro-esophageal reflux disease with esophagitis, without bleeding: Secondary | ICD-10-CM | POA: Diagnosis not present

## 2023-11-22 DIAGNOSIS — I4821 Permanent atrial fibrillation: Secondary | ICD-10-CM | POA: Diagnosis not present

## 2023-11-22 DIAGNOSIS — D649 Anemia, unspecified: Secondary | ICD-10-CM

## 2023-11-22 DIAGNOSIS — Z79899 Other long term (current) drug therapy: Secondary | ICD-10-CM

## 2023-11-22 DIAGNOSIS — D471 Chronic myeloproliferative disease: Secondary | ICD-10-CM

## 2023-11-22 DIAGNOSIS — N183 Chronic kidney disease, stage 3 unspecified: Secondary | ICD-10-CM

## 2023-11-22 DIAGNOSIS — Z862 Personal history of diseases of the blood and blood-forming organs and certain disorders involving the immune mechanism: Secondary | ICD-10-CM | POA: Diagnosis not present

## 2023-11-22 DIAGNOSIS — I509 Heart failure, unspecified: Secondary | ICD-10-CM | POA: Diagnosis not present

## 2023-11-22 LAB — CBC WITH DIFFERENTIAL (CANCER CENTER ONLY)
Abs Immature Granulocytes: 0.26 10*3/uL — ABNORMAL HIGH (ref 0.00–0.07)
Basophils Absolute: 0 10*3/uL (ref 0.0–0.1)
Basophils Relative: 0 %
Eosinophils Absolute: 0 10*3/uL (ref 0.0–0.5)
Eosinophils Relative: 0 %
HCT: 19.3 % — ABNORMAL LOW (ref 39.0–52.0)
Hemoglobin: 5.9 g/dL — CL (ref 13.0–17.0)
Immature Granulocytes: 5 %
Lymphocytes Relative: 24 %
Lymphs Abs: 1.2 10*3/uL (ref 0.7–4.0)
MCH: 28.6 pg (ref 26.0–34.0)
MCHC: 30.6 g/dL (ref 30.0–36.0)
MCV: 93.7 fL (ref 80.0–100.0)
Monocytes Absolute: 0.7 10*3/uL (ref 0.1–1.0)
Monocytes Relative: 13 %
Neutro Abs: 3 10*3/uL (ref 1.7–7.7)
Neutrophils Relative %: 58 %
Platelet Count: 392 10*3/uL (ref 150–400)
RBC: 2.06 MIL/uL — ABNORMAL LOW (ref 4.22–5.81)
RDW: 20.9 % — ABNORMAL HIGH (ref 11.5–15.5)
WBC Count: 5.2 10*3/uL (ref 4.0–10.5)
nRBC: 1.4 % — ABNORMAL HIGH (ref 0.0–0.2)

## 2023-11-22 LAB — CMP (CANCER CENTER ONLY)
ALT: 17 U/L (ref 0–44)
AST: 18 U/L (ref 15–41)
Albumin: 2.7 g/dL — ABNORMAL LOW (ref 3.5–5.0)
Alkaline Phosphatase: 111 U/L (ref 38–126)
Anion gap: 11 (ref 5–15)
BUN: 67 mg/dL — ABNORMAL HIGH (ref 8–23)
CO2: 21 mmol/L — ABNORMAL LOW (ref 22–32)
Calcium: 7.9 mg/dL — ABNORMAL LOW (ref 8.9–10.3)
Chloride: 103 mmol/L (ref 98–111)
Creatinine: 3.2 mg/dL — ABNORMAL HIGH (ref 0.61–1.24)
GFR, Estimated: 18 mL/min — ABNORMAL LOW (ref >60)
Glucose, Bld: 171 mg/dL — ABNORMAL HIGH (ref 70–99)
Potassium: 5.1 mmol/L (ref 3.5–5.1)
Sodium: 135 mmol/L (ref 135–145)
Total Bilirubin: 0.8 mg/dL (ref 0.0–1.2)
Total Protein: 6.4 g/dL — ABNORMAL LOW (ref 6.5–8.1)

## 2023-11-22 LAB — PREPARE RBC (CROSSMATCH)

## 2023-11-22 MED ORDER — LUSPATERCEPT-AAMT 75 MG ~~LOC~~ SOLR
1.0000 mg/kg | Freq: Once | SUBCUTANEOUS | Status: AC
  Administered 2023-11-22: 75 mg via SUBCUTANEOUS
  Filled 2023-11-22: qty 1.5

## 2023-11-22 NOTE — Progress Notes (Signed)
 Hematology/Oncology Consult note [facility]  Telephone:([phone] [phone]  Patient Care Team: [name], DO as PCP - General (Family Medicine) Jerelene Monday Deadra Everts, MD as PCP - Cardiology (Cardiology) Hillery Lown, MD as Referring Physician (Hematology) Rexene Catching, MD as Consulting Physician (Ophthalmology) Lisabeth Rider, MD as Consulting Physician (Neurology) Christina Coyer, MD as Consulting Physician (Urology) Jerelene Monday Deadra Everts, MD as Consulting Physician (Cardiology) Geraline Knapp, MD (Urology) Avonne Boettcher, MD as Consulting Physician (Hematology and Oncology) Avonne Boettcher, MD as Consulting Physician (Oncology) Grafton Lawrence, RN (Inactive) as [facility] Care Management   Name of the patient: [name]  [id number]  1940-09-13   Date of visit: 11/22/23  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  At that time he was found to have a mild splenomegaly of 15.8 cm.DIPPS score is 16 (age 67- 1, hemoglobin less than 10- 2) and score of 4 if 1% circulating blasts included from 07/17/2014.   Bone marrow on 06/11/2014 was most consistent with primary myelofibrosis.  Bone marrow biopsy showed 1% abnormal cells: CD45+, CD5+, CD10, CD11c+/-, CD19+, CD2-+, (dim), CD22+ (dim, CD23+, CD38-/+, FMC7-, HLA-DR+, sig lambda+(dim).  Blasts were not increased 1.2%; hypercellular for age: 27%; JAK2 V617F mutation was negative.  CALR mutation positive.  Flow cytometry included about 1% CLL/SLL phenotype cells (CD5+) of uncertain significance and some infiltrate into the marrow with increased atypical megakaryocytes.  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  MDS FISH panel was  negative.   Patient also follows up with [facility] benign hematology Dr. Dani Dupont for his anemia.  Patient's hemoglobin was drifting down to the eights and was started on EPO in October 2022.   Patient has had worsening anemia requiring intermittent blood transfusions despite receiving Retacrit  since October 2024.  PatientUnderwent a repeat bone marrow biopsy in February 2025.  Findings were consistent with myelofibrosis with evidence of possible dysplasia and increased ringed sideroblasts and questionable myeloid dysgranulopoiesis.  Cytogenetic studies could not be completed due to no metaphase cells available for analysis.  [employer] profile was also not completed due to limited specimen and poor quality DNA in the specimen.  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  He was doing poorly up until a month ago and was enrolled in hospice.  He has been doing better over the last 2 weeks and is here with his grandson today.  ECOG PS- 3 Pain scale- 0   Review of systems- Review of Systems  Constitutional:  Positive for malaise/fatigue. Negative for chills, fever and weight loss.  HENT:  Negative for congestion, ear discharge and nosebleeds.   Eyes:  Negative for blurred vision.  Respiratory:  Negative for cough, hemoptysis, sputum production, shortness of breath and wheezing.   Cardiovascular:  Negative for chest pain, palpitations, orthopnea and claudication.  Gastrointestinal:  Negative for abdominal pain, blood in stool, constipation, diarrhea, heartburn, melena, nausea and vomiting.  Genitourinary:  Negative for dysuria, flank pain, frequency, hematuria and urgency.  Musculoskeletal:  Negative for back pain, joint pain and myalgias.  Skin:  Negative for rash.  Neurological:  Negative for dizziness, tingling, focal weakness,  seizures, weakness and headaches.  Endo/Heme/Allergies:  Does not bruise/bleed easily.  Psychiatric/Behavioral:  Negative for depression and suicidal ideas. The patient does not have insomnia.       Allergies  Allergen Reactions   Quinolones Other (See Comments)    Patient with aortic aneurysm. Use of quinolones are considered to be contraindicated due to risk of aortic rupture.   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   right forearm, [facility]   Stroke (HCC) 11/2007   R brain subcortical infarct     Past Surgical History:  Procedure Laterality Date   ASPIRATION / INJECTION RENAL CYST  07/08/2015   BACK SURGERY     approx 20- 25 years ago   COLONOSCOPY     EYE SURGERY     cataract both eyes   GAS INSERTION  08/11/2011   Procedure: INSERTION OF GAS;  Surgeon: Rexene Catching, MD;  Location: [facility] OR;  Service: Ophthalmology;  Laterality: Right;  [id number]   IR BONE MARROW BIOPSY & ASPIRATION  08/03/2023   IR FLUORO GUIDE CV LINE RIGHT  10/23/2023   IR REMOVAL TUN CV CATH W/O FL  11/07/2023   IR THORACENTESIS ASP PLEURAL SPACE W/IMG GUIDE  09/22/2023   IR THORACENTESIS ASP PLEURAL SPACE W/IMG GUIDE  10/02/2023   LEFT HEART CATH AND CORONARY ANGIOGRAPHY N/A 05/02/2023   Procedure: LEFT HEART CATH AND CORONARY ANGIOGRAPHY;  Surgeon: Sammy Crisp, MD;  Location: [facility] INVASIVE CV LAB;  Service: Cardiovascular;  Laterality: N/A;   REVERSE  SHOULDER ARTHROPLASTY Right 04/13/2023   Procedure: REVERSE SHOULDER ARTHROPLASTY;  Surgeon: Elner Hahn, MD;  Location: [facility] ORS;  Service: Orthopedics;  Laterality: Right;   SCLERAL BUCKLE  08/11/2011   Procedure: SCLERAL BUCKLE;  Surgeon: Rexene Catching, MD;  Location: [facility] OR;  Service: Ophthalmology;  Laterality: Right;   VARICOSE VEIN SURGERY      Social History   Socioeconomic History   Marital status: Married    Spouse name: [name]    Number of children: 2   Years of education: 12+   Highest education level: Some college, no degree  Occupational History   Occupation: Fish farm manager: OTHER    Comment: community   Occupation: SELF EMPLOYED    Employer: SELF EMPLOYED  Tobacco Use   Smoking status: Former    Current packs/day: 0.00    Types: Cigarettes    Quit date: 06/06/2010    Years since quitting: 13.4   Smokeless tobacco: Never  Vaping Use   Vaping status: Never Used  Substance and Sexual Activity   Alcohol use: No    Alcohol/week: 0.0 standard drinks of alcohol   Drug use: No   Sexual activity: Not Currently  Other Topics Concern   Not on file  Social History Narrative  Pt lives at home with his family.   Caffeine Use- 2 cups daily   Patient has 2 children.    Patient has some college.    Patient is right handed.          Works full time   Social Drivers of Corporate investment banker Strain: Low Risk  (07/12/2021)   Overall Financial Resource Strain (CARDIA)    Difficulty of Paying Living Expenses: Not hard at all  Food Insecurity: No Food Insecurity (10/16/2023)   Hunger Vital Sign    Worried About Running Out of Food in the Last Year: Never true    Ran Out of Food in the Last Year: Never true  Transportation Needs: No Transportation Needs (10/16/2023)   PRAPARE - Administrator, Civil Service (Medical): No    Lack of Transportation (Non-Medical): No  Physical Activity: Inactive (07/12/2021)   Exercise Vital Sign    Days of Exercise  per Week: 0 days    Minutes of Exercise per Session: 0 min  Stress: No Stress Concern Present (07/12/2021)   [employer] of Occupational Health - Occupational Stress Questionnaire    Feeling of Stress : Not at all  Social Connections: Socially Integrated (10/16/2023)   Social Connection and Isolation Panel    Frequency of Communication with Friends and Family: More than three times a week    Frequency of Social Gatherings with Friends and Family: More than three times a week    Attends Religious Services: More than 4 times per year    Active Member of [employer] or Organizations: Yes    Attends Engineer, structural: More than 4 times per year    Marital Status: Married  Catering manager Violence: Not At Risk (10/16/2023)   Humiliation, Afraid, Rape, and Kick questionnaire    Fear of Current or Ex-Partner: No    Emotionally Abused: No    Physically Abused: No    Sexually Abused: No    Family History  Problem Relation Age of Onset   Heart disease Father    Dementia Sister    Diabetes Son    Kidney disease Neg Hx    Prostate cancer Neg Hx      Current Outpatient Medications:    acetaminophen  (TYLENOL ) 500 MG tablet, Take 1,000 mg by mouth every 6 (six) hours as needed for mild pain (pain score 1-3)., Disp: , Rfl:    amiodarone  (PACERONE ) 200 MG tablet, Take 1 tablet (200 mg total) by mouth daily., Disp: 30 tablet, Rfl: 0   cyanocobalamin  1000 MCG tablet, Take 1 tablet (1,000 mcg total) by mouth daily., Disp: 30 tablet, Rfl: 0   Dextromethorphan -guaiFENesin  20-200 MG/20ML LIQD, Take 10 mLs by mouth every 4 (four) hours as needed., Disp: 118 mL, Rfl: 0   DULoxetine  (CYMBALTA ) 20 MG capsule, Take 1 capsule (20 mg total) by mouth at bedtime., Disp: 30 capsule, Rfl: 0   furosemide  (LASIX ) 40 MG tablet, Take 1 tablet (40 mg total) by mouth 2 (two) times daily., Disp: 60 tablet, Rfl: 0   levothyroxine  (SYNTHROID ) 100 MCG tablet, Take 1 tablet (100 mcg total) by mouth daily before  breakfast., Disp: 30 tablet, Rfl: 0   lidocaine  (LIDODERM ) 5 %, Place 1 patch onto the skin daily. Remove & Discard patch within 12 hours or as directed by MD, Disp: 30 patch, Rfl: 0   metoprolol  tartrate (LOPRESSOR ) 50 MG tablet, Take 0.5 tablets (25 mg total) by mouth 2 (two) times daily., Disp: ,  Rfl:    Multiple Vitamin (MULITIVITAMIN WITH MINERALS) TABS, Take 1 tablet by mouth in the morning., Disp: , Rfl:    ondansetron  (ZOFRAN ) 4 MG tablet, Take 1 tablet (4 mg total) by mouth every 6 (six) hours as needed for nausea., Disp: 20 tablet, Rfl: 0   pantoprazole  (PROTONIX ) 40 MG tablet, Take 1 tablet (40 mg total) by mouth daily., Disp: 30 tablet, Rfl: 0   QUEtiapine  (SEROQUEL ) 25 MG tablet, Take 1 tablet (25 mg total) by mouth at bedtime as needed (sleep, restlessness)., Disp: 10 tablet, Rfl: 0   simvastatin  (ZOCOR ) 40 MG tablet, Take 1 tablet (40 mg total) by mouth at bedtime., Disp: 30 tablet, Rfl: 0   sodium bicarbonate  650 MG tablet, Take 1 tablet (650 mg total) by mouth 3 (three) times daily., Disp: , Rfl:    traMADol  (ULTRAM ) 50 MG tablet, Take 1 tablet (50 mg total) by mouth every 12 (twelve) hours as needed for moderate pain (pain score 4-6)., Disp: 30 tablet, Rfl: 0   [Paused] lenalidomide  (REVLIMID ) 5 MG capsule, Take 1 capsule (5 mg total) by mouth daily. Take for 21 days, then hold for 7 days. Repeat every 28 days. (Patient not taking: Reported on 11/22/2023), Disp: 21 capsule, Rfl: 0 No current facility-administered medications for this visit.  Facility-Administered Medications Ordered in Other Visits:    epoetin  alfa-epbx (RETACRIT ) injection 10,000 Units, 10,000 Units, Subcutaneous, Once, Avonne Boettcher, MD   epoetin  alfa-epbx (RETACRIT ) injection 40,000 Units, 40,000 Units, Subcutaneous, Once, Avonne Boettcher, MD  Physical exam:  Vitals:   11/22/23 0941  BP: (!) 113/50  Pulse: 86  Resp: 16  Temp: (!) 96.1 F (35.6 C)  TempSrc: Tympanic  SpO2: 97%  Weight: 146 lb (66.2 kg)    Physical Exam Constitutional:      Comments: Sitting in a wheelchair.  Appears frail   Cardiovascular:     Rate and Rhythm: Normal rate and regular rhythm.     Heart sounds: Normal heart sounds.  Pulmonary:     Effort: Pulmonary effort is normal.     Breath sounds: Normal breath sounds.  Abdominal:     General: Bowel sounds are normal.     Palpations: Abdomen is soft.   Musculoskeletal:     Right lower leg: No edema.     Left lower leg: No edema.   Skin:    General: Skin is warm and dry.   Neurological:     Mental Status: He is alert and oriented to person, place, and time.      I have personally reviewed labs listed below:    Latest Ref Rng & Units 11/22/2023    9:24 AM  CMP  Glucose 70 - 99 mg/dL 161   BUN 8 - 23 mg/dL 67   Creatinine 0.96 - 1.24 mg/dL 0.45   Sodium 409 - 811 mmol/L 135   Potassium 3.5 - 5.1 mmol/L 5.1   Chloride 98 - 111 mmol/L 103   CO2 22 - 32 mmol/L 21   Calcium  8.9 - 10.3 mg/dL 7.9   Total Protein 6.5 - 8.1 g/dL 6.4   Total Bilirubin 0.0 - 1.2 mg/dL 0.8   Alkaline Phos 38 - 126 U/L 111   AST 15 - 41 U/L 18   ALT 0 - 44 U/L 17       Latest Ref Rng & Units 11/22/2023    9:24 AM  CBC  WBC 4.0 - 10.5 K/uL 5.2   Hemoglobin 13.0 - 17.0  g/dL 5.9   Hematocrit 62.1 - 52.0 % 19.3   Platelets 150 - 400 K/uL 392    I have personally reviewed Radiology images listed below: No images are attached to the encounter.  IR Removal Tun Cv Cath W/O FL Result Date: 11/07/2023 Twana Gal, RT     11/07/2023  2:36 PM Removal of 16F tunneled central line. Patient was prepped with Chlorhexidine  and draped in the standard sterile fashion. Suture was cut and pulled, gentle traction was applied and the catheter in its entirety was removed. No bleeding noted at the time. Sterile gauze and tegaderm dressing was applied. Successful tunneled central line removal.   DG Chest Port 1 View Result Date: 10/24/2023 CLINICAL DATA:  Pleural effusion. EXAM:  PORTABLE CHEST 1 VIEW COMPARISON:  Chest radiograph dated 10/22/2023. FINDINGS: Right-sided central venous line with tip over central SVC. No significant interval change in the right pleural effusion and associated atelectasis or infiltrate. No pneumothorax stable cardiac silhouette no acute osseous pathology. Right shoulder arthroplasty IMPRESSION: No significant interval change in the right pleural effusion and associated atelectasis or infiltrate. Electronically Signed   By: Angus Bark M.D.   On: 10/24/2023 15:47   US  THORACENTESIS ASP PLEURAL SPACE W/IMG GUIDE Result Date: 10/24/2023 INDICATION: 83 year old male who presented to the ED with complaints of shortness of breath. Found to have right lower lobe pneumonia and recurrent pleural effusions. Request for therapeutic thoracentesis. EXAM: ULTRASOUND GUIDED right THORACENTESIS MEDICATIONS: 1% lidocaine , 6 mL COMPLICATIONS: None immediate. PROCEDURE: An ultrasound guided thoracentesis was thoroughly discussed with the patient and questions answered. The benefits, risks, alternatives and complications were also discussed. The patient understands and wishes to proceed with the procedure. Written consent was obtained. Ultrasound was performed to localize and mark an adequate pocket of fluid in the right chest. Ultrasound revealed multiple loculations within the pleural space. The area was then prepped and draped in the normal sterile fashion. 1% Lidocaine  was used for local anesthesia. Under ultrasound guidance a 6 Fr Safe-T-Centesis catheter was introduced. Thoracentesis was performed. The catheter was removed and a dressing applied. FINDINGS: A total of approximately 450 mL of dark amber fluid was removed. IMPRESSION: Successful ultrasound guided right thoracentesis yielding 450 mL of pleural fluid. Procedure performed by: Estella Helling, PA-C under the supervision of Dr. Terrence Ferron Electronically Signed   By: Erica Hau M.D.   On: 10/24/2023  15:21     Assessment and plan- Patient is a 83 y.o. male with history of primary myelofibrosis with overlap of myelodysplasia with increased ring sideroblasts and SF 3 B1 mutation here to discuss further management  Patient has pancytopenia but predominantly anemia requiring intermittent blood transfusions.  He has not responded to EPO in the past as well as Revlimid .  He also had significant pancytopenia with Revlimid .  Today his platelet counts are normal at 392.  White cell count is normal at 5.2.  Hemoglobin is down to 5.9 and he last received blood transfusion about a month ago.  I am planning to give him 2 units of blood transfusion in 2 days.  Discussed with the patient and his wife as well as grandson that his most recent bone marrow biopsy from February 2025 showed evidence of persistent myelofibrosis there was also evidence of myelodysplasia with increased ring sideroblasts.  Peripheral blood myeloid mutation panel did not show evidence of SF 3 B1 mutation.  I am therefore proceeding with luspatercept  injection 1 mg/kg every 3 weeks at this time.  Based on  his hemoglobin response we will adjust the dose of luspatercept .  Discussed risks and benefits of luspatercept  including all but not limited to nausea vomiting diarrhea hypertension leg edema and abnormal LFTs.  Patient understands and agrees to proceed as planned.  His family is also in agreement with the plan and they would like to revoke hospice at this time and see if his anemia would respond to present management.  Patient has history of hypothyroidism and is on levothyroxine  and his TSH was elevated at 32 months ago.  Will repeated in 3 weeks again  Type and screen today. Blood transfusion in 1 to 2 days- 2 units. Luspatercept  today.  H/H, cmp, hold tube for possible transfusion in 3 weeks, 6 weeks and 9 weeks. Luspatercept  each time. See dr Randy Buttery in 9 weeks. Check b12, folate tsh free t4 copper and zinc in 3 weeks. See NP Gerilyn Kobus  in 9 weeks   Visit Diagnosis 1. Myelodysplasia (myelodysplastic syndrome) (HCC)   2. History of myelodysplastic syndrome   3. Symptomatic anemia   4. High risk medication use   5. Primary myelofibrosis (HCC)      Dr. Seretha Dance, MD, MPH [facility] [phone] 11/22/2023 1:26 PM

## 2023-11-22 NOTE — Progress Notes (Signed)
 Spoke to [employer] in lab; critical alert value Hgb 5.9 (read back completed).

## 2023-11-22 NOTE — Progress Notes (Signed)
 Patient has been feeling very tired and fatigued, dehydrated. He is having some vision issues.

## 2023-11-23 ENCOUNTER — Inpatient Hospital Stay

## 2023-11-23 ENCOUNTER — Other Ambulatory Visit: Payer: Self-pay

## 2023-11-23 DIAGNOSIS — D649 Anemia, unspecified: Secondary | ICD-10-CM

## 2023-11-23 DIAGNOSIS — D469 Myelodysplastic syndrome, unspecified: Secondary | ICD-10-CM | POA: Diagnosis not present

## 2023-11-23 MED ORDER — SODIUM CHLORIDE 0.9% IV SOLUTION
250.0000 mL | INTRAVENOUS | Status: DC
  Administered 2023-11-23: 100 mL via INTRAVENOUS
  Filled 2023-11-23: qty 250

## 2023-11-24 LAB — BPAM RBC
Blood Product Expiration Date: 202507172359
Blood Product Expiration Date: 202507222359
ISSUE DATE / TIME: 202506191237
ISSUE DATE / TIME: 202506191045
Unit Type and Rh: 5100
Unit Type and Rh: 5100
Unit Type and Rh: 5100

## 2023-11-24 LAB — TYPE AND SCREEN
ABO/RH(D): O POS
Antibody Screen: NEGATIVE
Unit division: 0
Unit division: 0

## 2023-11-28 ENCOUNTER — Ambulatory Visit (INDEPENDENT_AMBULATORY_CARE_PROVIDER_SITE_OTHER): Admitting: Family Medicine

## 2023-11-28 VITALS — BP 121/75 | HR 84 | Temp 97.9°F | Ht 71.0 in

## 2023-11-28 DIAGNOSIS — J90 Pleural effusion, not elsewhere classified: Secondary | ICD-10-CM

## 2023-11-28 DIAGNOSIS — E039 Hypothyroidism, unspecified: Secondary | ICD-10-CM

## 2023-11-28 DIAGNOSIS — L97521 Non-pressure chronic ulcer of other part of left foot limited to breakdown of skin: Secondary | ICD-10-CM | POA: Diagnosis not present

## 2023-11-28 DIAGNOSIS — E559 Vitamin D deficiency, unspecified: Secondary | ICD-10-CM | POA: Diagnosis not present

## 2023-11-28 DIAGNOSIS — D61818 Other pancytopenia: Secondary | ICD-10-CM

## 2023-11-28 NOTE — Patient Instructions (Addendum)
   You are scheduled with Krystal Rosella 11/29/2023 at 315pm [address] [place], [place] [address] [phone]

## 2023-11-28 NOTE — Progress Notes (Signed)
 BP 121/75 (BP Location: Left Arm, Patient Position: Sitting, Cuff Size: Normal)   Pulse 84   Temp 97.9 F (36.6 C) (Oral)   Ht 5' 11 (1.803 m)   SpO2 96%   BMI 20.36 kg/m    Subjective:    Patient ID: [name], male    DOB: 03-17-1941, 83 y.o.   MRN: [id number]  HPI: [name] is a 83 y.o. male  Chief Complaint  Patient presents with   Hospitalization Follow-up   [place] has not been doing well since he was last here. He has been in and out of the hospital 3 times for extended visits due to pneumonia, bacteremia, subarachnoid hemorrhage and sepsis. He was last discharged from the hospital on 10/24/23. While he was in the hospital he was seen by infectious disease and palliative care. He was advised to go to rehab after his hospitalization, but declined that- choosing to go home with home health.  Since getting out of the hospital, he had a telephone visit with oncology who set him up with hospice at home. He saw oncology in person about a week ago and he was doing a bit better.   HYPOTHYROIDISM Thyroid  control status:uncontrolled Satisfied with current treatment? no Medication side effects: no Medication compliance: fair compliance Recent dose adjustment:yes Fatigue: yes Cold intolerance: no Heat intolerance: no Weight gain: no Weight loss: yes Constipation: no Diarrhea/loose stools: no Palpitations: yes Lower extremity edema: no Anxiety/depressed mood: yes  ANEMIA Anemia status: uncontrolled Etiology of anemia: iron deficiency, myelofibrosis Duration of anemia treatment: chronic Compliance with treatment: good compliance Iron supplementation side effects: no Severity of anemia: severe Fatigue: yes Decreased exercise tolerance: yes  Dyspnea on exertion: yes Palpitations: no Bleeding: no Pica: no  Relevant past medical, surgical, family and social history reviewed and updated as indicated. Interim medical history since our last visit  reviewed. Allergies and medications reviewed and updated.  Review of Systems  Constitutional:  Positive for fatigue. Negative for activity change, appetite change, chills, diaphoresis, fever and unexpected weight change.  Respiratory: Negative.    Cardiovascular: Negative.   Musculoskeletal: Negative.   Neurological:  Positive for dizziness and weakness. Negative for tremors, seizures, syncope, facial asymmetry, speech difficulty, light-headedness, numbness and headaches.  Psychiatric/Behavioral: Negative.      Per HPI unless specifically indicated above     Objective:    BP 121/75 (BP Location: Left Arm, Patient Position: Sitting, Cuff Size: Normal)   Pulse 84   Temp 97.9 F (36.6 C) (Oral)   Ht 5' 11 (1.803 m)   SpO2 96%   BMI 20.36 kg/m   Wt Readings from Last 3 Encounters:  11/22/23 146 lb (66.2 kg)  10/10/23 165 lb 2 oz (74.9 kg)  09/21/23 160 lb (72.6 kg)    Physical Exam Vitals and nursing note reviewed.  Constitutional:      General: He is not in acute distress.    Appearance: Normal appearance. He is ill-appearing. He is not toxic-appearing or diaphoretic.  HENT:     Head: Normocephalic and atraumatic.     Right Ear: External ear normal.     Left Ear: External ear normal.     Nose: Nose normal.     Mouth/Throat:     Mouth: Mucous membranes are moist.     Pharynx: Oropharynx is clear.   Eyes:     General: No scleral icterus.       Right eye: No discharge.  Left eye: No discharge.     Extraocular Movements: Extraocular movements intact.     Conjunctiva/sclera: Conjunctivae normal.     Pupils: Pupils are equal, round, and reactive to light.    Cardiovascular:     Rate and Rhythm: Normal rate and regular rhythm.     Pulses: Normal pulses.     Heart sounds: Normal heart sounds. No murmur heard.    No friction rub. No gallop.  Pulmonary:     Effort: Pulmonary effort is normal. No respiratory distress.     Breath sounds: Normal breath sounds. No  stridor. No wheezing, rhonchi or rales.  Chest:     Chest wall: No tenderness.   Musculoskeletal:        General: Normal range of motion.     Cervical back: Normal range of motion and neck supple.   Skin:    General: Skin is warm and dry.     Capillary Refill: Capillary refill takes less than 2 seconds.     Coloration: Skin is pale. Skin is not jaundiced.     Findings: No bruising, erythema, lesion or rash.     Comments: Broken down skin between pinky toe and 4th toe on the L foot   Neurological:     General: No focal deficit present.     Mental Status: He is alert and oriented to person, place, and time. Mental status is at baseline.     Comments: Wheelchair bound  Psychiatric:        Mood and Affect: Mood normal.        Behavior: Behavior normal.        Thought Content: Thought content normal.        Judgment: Judgment normal.     Results for orders placed or performed in visit on 11/28/23  TSH   Collection Time: 11/28/23  3:18 PM  Result Value Ref Range   TSH 58.700 (H) 0.450 - 4.500 uIU/mL  CBC with Differential/Platelet   Collection Time: 11/28/23  3:18 PM  Result Value Ref Range   WBC 7.0 3.4 - 10.8 x10E3/uL   RBC 2.94 (L) 4.14 - 5.80 x10E6/uL   Hemoglobin 8.5 (L) 13.0 - 17.7 g/dL   Hematocrit 73.1 (L) 62.4 - 51.0 %   MCV 91 79 - 97 fL   MCH 28.9 26.6 - 33.0 pg   MCHC 31.7 31.5 - 35.7 g/dL   RDW 82.7 (H) 88.3 - 84.5 %   Platelets 363 150 - 450 x10E3/uL   Neutrophils 51 Not Estab. %   Lymphs 34 Not Estab. %   Monocytes 12 Not Estab. %   Eos 0 Not Estab. %   Basos 0 Not Estab. %   Immature Cells Note    Neutrophils Absolute 3.6 1.4 - 7.0 x10E3/uL   Lymphocytes Absolute 2.4 0.7 - 3.1 x10E3/uL   Monocytes Absolute 0.8 0.1 - 0.9 x10E3/uL   EOS (ABSOLUTE) 0.0 0.0 - 0.4 x10E3/uL   Basophils Absolute 0.0 0.0 - 0.2 x10E3/uL   NRBC 3 (H) 0 - 0 %   Hematology Comments: Note:   Comprehensive metabolic panel with GFR   Collection Time: 11/28/23  3:18 PM  Result  Value Ref Range   Glucose 85 70 - 99 mg/dL   BUN 73 (H) 8 - 27 mg/dL   Creatinine, Ser 7.10 (H) 0.76 - 1.27 mg/dL   eGFR 21 (L) >40 fO/fpw/8.26   BUN/Creatinine Ratio 25 (H) 10 - 24   Sodium 138 134 - 144 mmol/L  Potassium 6.4 (H) 3.5 - 5.2 mmol/L   Chloride 102 96 - 106 mmol/L   CO2 20 20 - 29 mmol/L   Calcium  8.4 (L) 8.6 - 10.2 mg/dL   Total Protein 6.0 6.0 - 8.5 g/dL   Albumin  3.4 (L) 3.7 - 4.7 g/dL   Globulin, Total 2.6 1.5 - 4.5 g/dL   Bilirubin Total 0.4 0.0 - 1.2 mg/dL   Alkaline Phosphatase 155 (H) 44 - 121 IU/L   AST 17 0 - 40 IU/L   ALT 17 0 - 44 IU/L  VITAMIN D  25 Hydroxy (Vit-D Deficiency, Fractures)   Collection Time: 11/28/23  3:18 PM  Result Value Ref Range   Vit D, 25-Hydroxy 43.6 30.0 - 100.0 ng/mL  B12   Collection Time: 11/28/23  3:18 PM  Result Value Ref Range   Vitamin B-12 1,098 232 - 1,245 pg/mL  Immature Cells   Collection Time: 11/28/23  3:18 PM  Result Value Ref Range   Metamyelocytes 2 (H) 0 - 0 %   Blasts/blast like cells 1 (H) 0 - 0 %   *Note: Due to a large number of results and/or encounters for the requested time period, some results have not been displayed. A complete set of results can be found in Results Review.      Assessment & Plan:   Problem List Items Addressed This Visit       Respiratory   Pleural effusion - Primary   Will repeat CXR. Await results. Treat as needed.       Relevant Orders   DG Chest 2 View   Ambulatory referral to Home Health     Endocrine   Hypothyroidism   Rechecking labs today. Await results. Treat as needed.       Relevant Orders   TSH (Completed)   Ambulatory referral to Home Health     Hematopoietic and Hemostatic   Pancytopenia (HCC)   Rechecking labs today. Await results. Treat as needed.       Relevant Orders   CBC with Differential/Platelet (Completed)   Comprehensive metabolic panel with GFR (Completed)   B12 (Completed)   Ambulatory referral to Home Health     Other   Vitamin D   deficiency   Rechecking labs today. Await results. Treat as needed.       Relevant Orders   VITAMIN D  25 Hydroxy (Vit-D Deficiency, Fractures) (Completed)   Ambulatory referral to Home Health   Other Visit Diagnoses       Ulcer of left foot, limited to breakdown of skin [facility])       Referral to podiatry placed today. Will get home health out. Wrapped today. Continue to monitor.   Relevant Orders   Ambulatory referral to Podiatry   Ambulatory referral to Home Health        Follow up plan: Return 3-4 weeks. OK to use same day.  >25 minutes spent with patient and his wife today

## 2023-11-29 ENCOUNTER — Encounter: Payer: Self-pay | Admitting: Oncology

## 2023-11-29 DIAGNOSIS — M79675 Pain in left toe(s): Secondary | ICD-10-CM | POA: Diagnosis not present

## 2023-11-29 DIAGNOSIS — B351 Tinea unguium: Secondary | ICD-10-CM | POA: Diagnosis not present

## 2023-11-29 DIAGNOSIS — L97522 Non-pressure chronic ulcer of other part of left foot with fat layer exposed: Secondary | ICD-10-CM | POA: Diagnosis not present

## 2023-11-29 DIAGNOSIS — M79674 Pain in right toe(s): Secondary | ICD-10-CM | POA: Diagnosis not present

## 2023-11-29 LAB — COMPREHENSIVE METABOLIC PANEL WITH GFR
ALT: 17 IU/L (ref 0–44)
AST: 17 IU/L (ref 0–40)
Albumin: 3.4 g/dL — ABNORMAL LOW (ref 3.7–4.7)
Alkaline Phosphatase: 155 IU/L — ABNORMAL HIGH (ref 44–121)
BUN/Creatinine Ratio: 25 — ABNORMAL HIGH (ref 10–24)
BUN: 73 mg/dL — ABNORMAL HIGH (ref 8–27)
Bilirubin Total: 0.4 mg/dL (ref 0.0–1.2)
CO2: 20 mmol/L (ref 20–29)
Calcium: 8.4 mg/dL — ABNORMAL LOW (ref 8.6–10.2)
Chloride: 102 mmol/L (ref 96–106)
Creatinine, Ser: 2.89 mg/dL — ABNORMAL HIGH (ref 0.76–1.27)
Globulin, Total: 2.6 g/dL (ref 1.5–4.5)
Glucose: 85 mg/dL (ref 70–99)
Potassium: 6.4 mmol/L — ABNORMAL HIGH (ref 3.5–5.2)
Sodium: 138 mmol/L (ref 134–144)
Total Protein: 6 g/dL (ref 6.0–8.5)
eGFR: 21 mL/min/{1.73_m2} — ABNORMAL LOW (ref >59)

## 2023-11-29 LAB — CBC WITH DIFFERENTIAL/PLATELET
Basophils Absolute: 0 10*3/uL (ref 0.0–0.2)
Basos: 0 %
EOS (ABSOLUTE): 0 10*3/uL (ref 0.0–0.4)
Eos: 0 %
Hematocrit: 26.8 % — ABNORMAL LOW (ref 37.5–51.0)
Hemoglobin: 8.5 g/dL — ABNORMAL LOW (ref 13.0–17.7)
Lymphocytes Absolute: 2.4 10*3/uL (ref 0.7–3.1)
Lymphs: 34 %
MCH: 28.9 pg (ref 26.6–33.0)
MCHC: 31.7 g/dL (ref 31.5–35.7)
MCV: 91 fL (ref 79–97)
Monocytes Absolute: 0.8 10*3/uL (ref 0.1–0.9)
Monocytes: 12 %
NRBC: 3 % — ABNORMAL HIGH (ref 0–0)
Neutrophils Absolute: 3.6 10*3/uL (ref 1.4–7.0)
Neutrophils: 51 %
Platelets: 363 10*3/uL (ref 150–450)
RBC: 2.94 x10E6/uL — ABNORMAL LOW (ref 4.14–5.80)
RDW: 17.2 % — ABNORMAL HIGH (ref 11.6–15.4)
WBC: 7 10*3/uL (ref 3.4–10.8)

## 2023-11-29 LAB — VITAMIN D 25 HYDROXY (VIT D DEFICIENCY, FRACTURES): Vit D, 25-Hydroxy: 43.6 ng/mL (ref 30.0–100.0)

## 2023-11-29 LAB — IMMATURE CELLS
Blasts/blast like cells: 1 % — ABNORMAL HIGH (ref 0–0)
Metamyelocytes: 2 % — ABNORMAL HIGH (ref 0–0)

## 2023-11-29 LAB — VITAMIN B12: Vitamin B-12: 1098 pg/mL (ref 232–1245)

## 2023-11-29 LAB — TSH: TSH: 58.7 u[IU]/mL — ABNORMAL HIGH (ref 0.450–4.500)

## 2023-11-29 NOTE — Progress Notes (Signed)
 CC:  Chief Complaint  Patient presents with  . New Patient  . Foot Problem    Left 5th toe sore x few weeks     [name] is a 83 y.o. with a complaint of a sore on his left fifth toe that has been present for the last 3 weeks or so.  Was seen by his primary care yesterday and referred for urgent referral.  His wife states that she has been applying some Neosporin and a bandage.  Does not recall any injury.  Also relates thick painful toenails and all of his toes causing some pain and pressure in his shoes when walking.   Pt has tried the following treatment(s): As mentioned above.     PMH: Past Medical History:  Diagnosis Date  . Anemia   . CVA (cerebral vascular accident) (CMS/HHS-HCC) 2009   thrombocytic therapy  . Degenerative joint disease (DJD) of lumbar spine   . Diverticulitis   . Kidney cysts 2017   right sided cyst was aspirated in 2017  . 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 Splenomegaly 2016   On imaging study    Medication: Current Outpatient Medications on File Prior to Visit  Medication Sig Dispense Refill  . acetaminophen  (TYLENOL ) 650 MG ER tablet Take 650 mg by mouth once daily as needed for Pain    . albuterol  MDI, PROVENTIL , VENTOLIN , PROAIR , HFA 90 mcg/actuation inhaler Inhale 2 Inhalations into the lungs every 6 (six) hours as needed    . amLODIPine  (NORVASC ) 10 MG tablet Take 10 mg by mouth once daily    . calcium  carbonate 600 mg calcium  (1,500 mg) Tab tablet Take 600 mg by mouth once daily    . cholecalciferol (VITAMIN D3) 1,000 unit capsule Take 10,000 Units by mouth. 3 times a week     . cyanocobalamin  (VITAMIN B12) 1000 MCG tablet Take 1,000 mcg by mouth once daily    . diclofenac  (VOLTAREN ) 1 % topical gel Apply 2 g topically 4 (four) times daily. Apply 4 gm to affected area quid.  Do not exceed 16 gm per day (Patient taking differently: Apply 2 g topically once daily as needed Apply 4 gm to affected area quid.  Do not exceed  16 gm per day) 500 g 3  . DULoxetine  (CYMBALTA ) 20 MG DR capsule Take 1 capsule by mouth once daily    . gabapentin  (NEURONTIN ) 400 MG capsule Take 400 mg by mouth Take 1 tab in the AM and 2 tabs at bedtime.    SABRA GINSENG ORAL Take 500 mg by mouth once daily.    . levothyroxine  (SYNTHROID ) 25 MCG tablet Take 1 tablet by mouth every morning before breakfast (0630)    . losartan  (COZAAR ) 100 MG tablet Take 1 tablet by mouth every morning    . meclizine  (ANTIVERT ) 25 mg tablet Take 1 tablet by mouth 3 (three) times daily as needed for Dizziness    . methocarbamol  (ROBAXIN ) 500 MG tablet TAKE ONE TABLET 3 TIMES DAILY 90 tablet 2  . multivitamin with iron-minerals (SUPER THERA VITE M) tablet Take 1 tablet by mouth once daily.      SABRA olmesartan (BENICAR) 40 MG tablet Take 40 mg by mouth once daily.      SABRA omega-3 fatty acids 300 mg Cap Take 1 tablet by mouth 2 (two) times daily.      . omeprazole  (PRILOSEC) 40 MG DR capsule Take 40 mg by mouth once daily    .  patiromer  calcium  sorbitex (VELTASSA ) 8.4 gram packet Take 8.4 g by mouth once daily    . ranitidine (ZANTAC) 150 MG tablet Take 150 mg by mouth 2 (two) times daily.    . tadalafiL  (CIALIS ) 5 MG tablet Take 5 mg by mouth once daily    . tamsulosin  (FLOMAX ) 0.4 mg capsule Take 0.4 mg by mouth once daily.      . triamcinolone  0.1 % cream Apply topically 2 (two) times daily Apply to affected skin rash on buttock twice a day for 5 days, each week prn    . zinc gluconate 50 mg tablet Take 50 mg by mouth once daily     No current facility-administered medications on file prior to visit.    Allergies: Allergies as of 11/29/2023 - Reviewed 11/29/2023  Allergen Reaction Noted  . Quinolones Other (See Comments) 10/19/2023  . Meloxicam Nausea And Vomiting and Other (See Comments) 07/17/2014    Surgical History: Past Surgical History:  Procedure Laterality Date  . BIOPSY BONE MARROW  06/11/2014  . Reverse TSA Right 04/13/2023   By Dr. Edie   . Back surgery    . COLONOSCOPY  ? 2011    Social History: Social History   Socioeconomic History  . Marital status: Married    Spouse name: Maceo  . Number of children: 2  . Years of education: 6+  Occupational History  . Occupation: Full-time - Holiday representative  Tobacco Use  . Smoking status: Former    Types: Cigarettes  . Smokeless tobacco: Never  . Tobacco comments:    Stopped by himself  Vaping Use  . Vaping status: Never Used  Substance and Sexual Activity  . Alcohol use: Yes    Comment: Once per month  . Drug use: No  . Sexual activity: Defer    Partners: Female   Social Drivers of Corporate investment banker Strain: Low Risk  (07/12/2021)   Received from [facility]   Overall Financial Resource Strain (CARDIA)   . Difficulty of Paying Living Expenses: Not hard at all  Food Insecurity: No Food Insecurity (10/16/2023)   Received from [facility]   Hunger Vital Sign   . Within the past 12 months, you worried that your food would run out before you got the money to buy more.: Never true   . Within the past 12 months, the food you bought just didn't last and you didn't have money to get more.: Never true  Transportation Needs: No Transportation Needs (10/16/2023)   Received from [facility] - Transportation   . Lack of Transportation (Medical): No   . Lack of Transportation (Non-Medical): No  Physical Activity: Inactive (07/12/2021)   Received from [facility]   Exercise Vital Sign   . On average, how many days per week do you engage in moderate to strenuous exercise (like a brisk walk)?: 0 days   . On average, how many minutes do you engage in exercise at this level?: 0 min  Stress: No Stress Concern Present (07/12/2021)   Received from [facility] of Occupational Health - Occupational Stress Questionnaire   . Feeling of Stress : Not at all  Social Connections: Socially Integrated (10/16/2023)   Received from [facility]   Social  Connection and Isolation Panel   . In a typical week, how many times do you talk on the phone with family, friends, or neighbors?: More than three times a week   . How often  do you get together with friends or relatives?: More than three times a week   . How often do you attend church or religious services?: More than 4 times per year   . Do you belong to any clubs or organizations such as church groups, unions, fraternal or athletic groups, or school groups?: Yes   . How often do you attend meetings of the clubs or organizations you belong to?: More than 4 times per year   . Are you married, widowed, divorced, separated, never married, or living with a partner?: Married  Housing Stability: Low Risk  (09/22/2023)   Received from [facility] Stability Vital Sign   . Unable to Pay for Housing in the Last Year: No   . Number of Times Moved in the Last Year: 0   . Homeless in the Last Year: No   Social History   Tobacco Use  Smoking Status Former  . Types: Cigarettes  Smokeless Tobacco Never  Tobacco Comments   Stopped by himself      Review of Systems:  A comprehensive review of systems is documented elsewehere in the encounter.   Review of Systems : Review of systems is documented in this chart under nurses   Objective: Constitutional: General appearance is well with no acute distress.  Normal mood and affect. Vascular:Left foot:  Dorsalis Pedis:  diminished Posterior Tibial:  diminished      Right foot:  Dorsalis Pedis:  present Posterior Tibial:  present  Neuro:  Epicritic sensations grossly intact  Derm: The skin is warm dry and supple.  Adequate hair growth.  All 10 toenails are thick, dystrophic, discolored, brittle with subungual debris.  Ulcerative fibrotic area is noted along the medial aspect of the left fifth toe measuring approximately 15 mm x 6 to 7 mm with depth of 1 to 2 mm.  Some mild surrounding erythema.  No clear evidence of extension to the  bone.  Ortho/MS:Painfree ROM to ankle, subtalar, midtarsal, and metatarsalphalangeal joints.  Full muscle strength to all major muscle groups of the lower extremity  Xrays DP, oblique, and lateral of the left foot taken today and reviewed by myself reveals a normal joint space pattern throughout the foot.  No evidence of any cortical erosions or signs of osteomyelitis in the fifth toe specifically.  No evidence of arterial calcification noted into the foot.  Assessment: Encounter Diagnoses  Name Primary?  . Skin ulcer of toe of left foot with fat layer exposed (CMS/HHS-HCC) Yes  . Mycotic toenails   . Pain in toes of both feet     Plan: Debridement of all 10 toenails in length and thickness sharply using toenail nippers.  Mild debridement of the ulcerative area on the left fifth toe sharply using tissue nippers.  Bactroban  and gauze dressing applied.  Instructed to continue daily local wound care with antibiotic ointment and a gauze dressing to help separate the 4th and 5th toes.  Discussed with the patient and his wife that the ulceration does appear to go fairly deep.  If it does get down to the level of the bone discussed that he could be at risk for amputation of the fifth toe.  Prescription sent into his pharmacy for Augmentin for the cellulitis.  Patient will return to clinic in 2 weeks for follow-up.  Instructed that if it does get worse sooner he should call back for reevaluation.  Orders Placed This Encounter  Procedures  . X-ray foot left 3 plus  views    Standing Status:   Future    Number of Occurrences:   1    Expiration Date:   11/28/2024    This procedure is enabled for patient scheduling in MyChart. Please indicate if the patient should not be able to schedule an appointment for this procedure in MyChart.:   Allow MyChart Scheduling    Release to patient:   Immediate    Return in about 2 weeks (around 12/13/2023).

## 2023-11-30 ENCOUNTER — Ambulatory Visit: Payer: Self-pay | Admitting: Family Medicine

## 2023-11-30 DIAGNOSIS — G3184 Mild cognitive impairment, so stated: Secondary | ICD-10-CM | POA: Diagnosis not present

## 2023-11-30 DIAGNOSIS — M1712 Unilateral primary osteoarthritis, left knee: Secondary | ICD-10-CM | POA: Diagnosis not present

## 2023-11-30 DIAGNOSIS — L03116 Cellulitis of left lower limb: Secondary | ICD-10-CM | POA: Diagnosis not present

## 2023-11-30 DIAGNOSIS — K5730 Diverticulosis of large intestine without perforation or abscess without bleeding: Secondary | ICD-10-CM | POA: Diagnosis not present

## 2023-11-30 DIAGNOSIS — L97522 Non-pressure chronic ulcer of other part of left foot with fat layer exposed: Secondary | ICD-10-CM | POA: Diagnosis not present

## 2023-11-30 DIAGNOSIS — M47812 Spondylosis without myelopathy or radiculopathy, cervical region: Secondary | ICD-10-CM | POA: Diagnosis not present

## 2023-11-30 DIAGNOSIS — E559 Vitamin D deficiency, unspecified: Secondary | ICD-10-CM | POA: Diagnosis not present

## 2023-11-30 DIAGNOSIS — K219 Gastro-esophageal reflux disease without esophagitis: Secondary | ICD-10-CM | POA: Diagnosis not present

## 2023-11-30 DIAGNOSIS — I480 Paroxysmal atrial fibrillation: Secondary | ICD-10-CM | POA: Diagnosis not present

## 2023-11-30 DIAGNOSIS — N400 Enlarged prostate without lower urinary tract symptoms: Secondary | ICD-10-CM | POA: Diagnosis not present

## 2023-11-30 DIAGNOSIS — D469 Myelodysplastic syndrome, unspecified: Secondary | ICD-10-CM | POA: Diagnosis not present

## 2023-11-30 DIAGNOSIS — E782 Mixed hyperlipidemia: Secondary | ICD-10-CM | POA: Diagnosis not present

## 2023-11-30 DIAGNOSIS — D7581 Myelofibrosis: Secondary | ICD-10-CM | POA: Diagnosis not present

## 2023-11-30 DIAGNOSIS — S066X0D Traumatic subarachnoid hemorrhage without loss of consciousness, subsequent encounter: Secondary | ICD-10-CM | POA: Diagnosis not present

## 2023-11-30 DIAGNOSIS — I129 Hypertensive chronic kidney disease with stage 1 through stage 4 chronic kidney disease, or unspecified chronic kidney disease: Secondary | ICD-10-CM | POA: Diagnosis not present

## 2023-11-30 DIAGNOSIS — R161 Splenomegaly, not elsewhere classified: Secondary | ICD-10-CM | POA: Diagnosis not present

## 2023-11-30 DIAGNOSIS — H33001 Unspecified retinal detachment with retinal break, right eye: Secondary | ICD-10-CM | POA: Diagnosis not present

## 2023-11-30 DIAGNOSIS — D631 Anemia in chronic kidney disease: Secondary | ICD-10-CM | POA: Diagnosis not present

## 2023-11-30 DIAGNOSIS — E440 Moderate protein-calorie malnutrition: Secondary | ICD-10-CM | POA: Diagnosis not present

## 2023-11-30 DIAGNOSIS — J90 Pleural effusion, not elsewhere classified: Secondary | ICD-10-CM | POA: Diagnosis not present

## 2023-11-30 DIAGNOSIS — E039 Hypothyroidism, unspecified: Secondary | ICD-10-CM | POA: Diagnosis not present

## 2023-11-30 DIAGNOSIS — N184 Chronic kidney disease, stage 4 (severe): Secondary | ICD-10-CM | POA: Diagnosis not present

## 2023-11-30 DIAGNOSIS — D61818 Other pancytopenia: Secondary | ICD-10-CM | POA: Diagnosis not present

## 2023-11-30 DIAGNOSIS — I890 Lymphedema, not elsewhere classified: Secondary | ICD-10-CM | POA: Diagnosis not present

## 2023-11-30 DIAGNOSIS — I4892 Unspecified atrial flutter: Secondary | ICD-10-CM | POA: Diagnosis not present

## 2023-11-30 MED ORDER — LEVOTHYROXINE SODIUM 150 MCG PO TABS: 150.0000 ug | ORAL_TABLET | Freq: Every day | ORAL | 1 refills | Status: DC

## 2023-12-01 ENCOUNTER — Other Ambulatory Visit: Payer: Self-pay | Admitting: Family Medicine

## 2023-12-04 ENCOUNTER — Encounter: Payer: Self-pay | Admitting: Family Medicine

## 2023-12-04 DIAGNOSIS — E559 Vitamin D deficiency, unspecified: Secondary | ICD-10-CM | POA: Insufficient documentation

## 2023-12-04 NOTE — Telephone Encounter (Signed)
 Requested medication (s) are due for refill today - yes  Requested medication (s) are on the active medication list -yes  Future visit scheduled -no  Last refill: 10/23/23 #60  Notes to clinic: outside provider(Hospitalist)- sent for review of request  Requested Prescriptions  Pending Prescriptions Disp Refills   furosemide  (LASIX ) 40 MG tablet [Pharmacy Med Name: FUROSEMIDE  40 MG TAB] 90 tablet     Sig: TAKE 1 TABLET BY MOUTH DAILY     Cardiovascular:  Diuretics - Loop Failed - 12/04/2023  2:44 PM      Failed - K in normal range and within 180 days    Potassium  Date Value Ref Range Status  11/28/2023 6.4 (H) 3.5 - 5.2 mmol/L Final  06/18/2014 4.5 3.5 - 5.1 mmol/L Final         Failed - Ca in normal range and within 180 days    Calcium   Date Value Ref Range Status  11/28/2023 8.4 (L) 8.6 - 10.2 mg/dL Final   Calcium , Total  Date Value Ref Range Status  06/18/2014 8.5 8.5 - 10.1 mg/dL Final   Calcium , Ion  Date Value Ref Range Status  09/21/2023 1.06 (L) 1.15 - 1.40 mmol/L Final         Failed - Cr in normal range and within 180 days    Creatinine  Date Value Ref Range Status  11/22/2023 3.20 (H) 0.61 - 1.24 mg/dL Final  98/86/7983 8.90 0.60 - 1.30 mg/dL Final   Creatinine, Ser  Date Value Ref Range Status  11/28/2023 2.89 (H) 0.76 - 1.27 mg/dL Final         Passed - Na in normal range and within 180 days    Sodium  Date Value Ref Range Status  11/28/2023 138 134 - 144 mmol/L Final  06/18/2014 141 136 - 145 mmol/L Final         Passed - Cl in normal range and within 180 days    Chloride  Date Value Ref Range Status  11/28/2023 102 96 - 106 mmol/L Final  06/18/2014 107 98 - 107 mmol/L Final         Passed - Mg Level in normal range and within 180 days    Magnesium   Date Value Ref Range Status  10/22/2023 1.8 1.7 - 2.4 mg/dL Final    Comment:    Performed at [facility], [address] [place], [place] [address]         Passed - Last BP  in normal range    BP Readings from Last 1 Encounters:  11/28/23 121/75         Passed - Valid encounter within last 6 months    Recent Outpatient Visits           6 days ago Pleural effusion on right   [facility], Megan P, DO   3 months ago Benign hypertensive renal disease   [facility] [place], Megan P, DO   4 months ago Sacral pain   [facility] [facility] [place], [place], DO                 Requested Prescriptions  Pending Prescriptions Disp Refills   furosemide  (LASIX ) 40 MG tablet [Pharmacy Med Name: FUROSEMIDE  40 MG TAB] 90 tablet     Sig: TAKE 1 TABLET BY MOUTH DAILY     Cardiovascular:  Diuretics - Loop Failed - 12/04/2023  2:44 PM  Failed - K in normal range and within 180 days    Potassium  Date Value Ref Range Status  11/28/2023 6.4 (H) 3.5 - 5.2 mmol/L Final  06/18/2014 4.5 3.5 - 5.1 mmol/L Final         Failed - Ca in normal range and within 180 days    Calcium   Date Value Ref Range Status  11/28/2023 8.4 (L) 8.6 - 10.2 mg/dL Final   Calcium , Total  Date Value Ref Range Status  06/18/2014 8.5 8.5 - 10.1 mg/dL Final   Calcium , Ion  Date Value Ref Range Status  09/21/2023 1.06 (L) 1.15 - 1.40 mmol/L Final         Failed - Cr in normal range and within 180 days    Creatinine  Date Value Ref Range Status  11/22/2023 3.20 (H) 0.61 - 1.24 mg/dL Final  98/86/7983 8.90 0.60 - 1.30 mg/dL Final   Creatinine, Ser  Date Value Ref Range Status  11/28/2023 2.89 (H) 0.76 - 1.27 mg/dL Final         Passed - Na in normal range and within 180 days    Sodium  Date Value Ref Range Status  11/28/2023 138 134 - 144 mmol/L Final  06/18/2014 141 136 - 145 mmol/L Final         Passed - Cl in normal range and within 180 days    Chloride  Date Value Ref Range Status  11/28/2023 102 96 - 106 mmol/L Final  06/18/2014 107 98 - 107 mmol/L Final         Passed - Mg Level in normal  range and within 180 days    Magnesium   Date Value Ref Range Status  10/22/2023 1.8 1.7 - 2.4 mg/dL Final    Comment:    Performed at [facility], [address] [place], [place] [address]         Passed - Last BP in normal range    BP Readings from Last 1 Encounters:  11/28/23 121/75         Passed - Valid encounter within last 6 months    Recent Outpatient Visits           6 days ago Pleural effusion on right   [facility] [place], Megan P, DO   3 months ago Benign hypertensive renal disease   [place] [facility] [place], Megan P, DO   4 months ago Sacral pain   [facility] [place], [place], DO

## 2023-12-04 NOTE — Assessment & Plan Note (Signed)
 Rechecking labs today. Await results. Treat as needed.

## 2023-12-04 NOTE — Assessment & Plan Note (Signed)
Will repeat CXR. Await results. Treat as needed.

## 2023-12-05 ENCOUNTER — Telehealth: Payer: Self-pay

## 2023-12-05 NOTE — Telephone Encounter (Signed)
 Copied from CRM [id number]. Topic: Clinical - Lab/Test Results >> Dec 04, 2023  1:20 PM Geneva B wrote: Reason for CRM: please call patient back to go over lab work

## 2023-12-05 NOTE — Telephone Encounter (Signed)
 Copied from CRM [id number]. Topic: General - Other >> Dec 05, 2023  9:15 AM Essie A wrote: Reason for CRM: Patient's wife, Maceo, returned the call from the office.  I read the lab results to her and she was satisfied.  Will call back if there are questions.

## 2023-12-05 NOTE — Telephone Encounter (Signed)
 2nd attempt made to reach patient and discuss results. Will try again on next business day. Ok for E2C2 to review results if call is returned.

## 2023-12-06 ENCOUNTER — Ambulatory Visit (HOSPITAL_COMMUNITY): Attending: Physician Assistant | Admitting: Physician Assistant

## 2023-12-06 ENCOUNTER — Encounter (HOSPITAL_COMMUNITY): Payer: Self-pay

## 2023-12-06 ENCOUNTER — Ambulatory Visit (HOSPITAL_COMMUNITY): Admitting: Physician Assistant

## 2023-12-06 NOTE — Progress Notes (Incomplete)
 Primary Care Physician: Vicci Duwaine SQUIBB, DO Primary Cardiologist: Evalene Lunger, MD Electrophysiologist: None  Referring Physician: Dr Alveta Marinda DELENA [name] is a 83 y.o. male with a history of myelofibrosis, CKD, HTN, HLD, PAD, CVA, traumatic SAH, atrial fibrillation who presents for follow up in the [facility].  The patient has had several hospital admission recently. 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 He had attempt at cardioversion 12/19 with return to NSR but back into aflutter s/p DCCV for atrial flutter 12/26 with conversion to NSR with 1st degree AV block, started on amiodarone .   He was admitted to [facility] 10/16/23 with coughing and wheezing. Found to have sepsis 2/2 RLL pneumonia, AKI, CHF. ID and palliative care consulted. He has been off anticoagulation following a traumatic [facility] 09/2023. He has been maintained on metoprolol  and amiodarone  for rate control.    Patient presents today for follow up for atrial fibrillation. ***  Today, he denies symptoms of ***palpitations, chest pain, shortness of breath, orthopnea, PND, lower extremity edema, dizziness, presyncope, syncope, snoring, daytime somnolence, bleeding, or neurologic sequela. The patient is tolerating medications without difficulties and is otherwise without complaint today.    Atrial Fibrillation Risk Factors:  he does not have symptoms or diagnosis of sleep apnea. he does not have a history of rheumatic fever.   Atrial Fibrillation Management history:  Previous antiarrhythmic drugs: amiodarone  Previous cardioversions: *** Previous ablations: *** Anticoagulation history: ***  ROS- All systems are reviewed and negative except as per the HPI above.  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     amiodarone  (PACERONE ) 200 MG tablet Take 1 tablet (200 mg total) by mouth daily. 30 tablet 0   cyanocobalamin  1000 MCG tablet Take 1 tablet (1,000 mcg total) by mouth daily. 30 tablet 0   Dextromethorphan -guaiFENesin  20-200 MG/20ML LIQD Take 10 mLs by mouth every 4 (four) hours as needed. 118 mL 0   DULoxetine  (CYMBALTA ) 20 MG capsule Take 1 capsule (20 mg total) by mouth at bedtime. 30 capsule 0   furosemide  (LASIX ) 40 MG tablet TAKE 1 TABLET BY MOUTH DAILY 90 tablet 0   [Paused] lenalidomide  (REVLIMID ) 5 MG capsule Take 1 capsule (5 mg total) by mouth daily. Take for 21 days, then hold for 7 days. Repeat every 28 days. (Patient not taking: Reported on 11/22/2023) 21 capsule 0   levothyroxine  (SYNTHROID ) 150 MCG tablet Take 1 tablet (150 mcg total) by mouth daily before  breakfast. 30 tablet 1   lidocaine  (LIDODERM ) 5 % Place 1 patch onto the skin daily. Remove & Discard patch within 12 hours or as directed by MD 30 patch 0   metoprolol  tartrate (LOPRESSOR ) 50 MG tablet Take 0.5 tablets (25 mg total) by mouth 2 (two) times daily.     Multiple Vitamin (MULITIVITAMIN WITH MINERALS) TABS Take 1 tablet by mouth in the morning.     ondansetron  (ZOFRAN ) 4 MG tablet  Take 1 tablet (4 mg total) by mouth every 6 (six) hours as needed for nausea. 20 tablet 0   pantoprazole  (PROTONIX ) 40 MG tablet Take 1 tablet (40 mg total) by mouth daily. 30 tablet 0   QUEtiapine  (SEROQUEL ) 25 MG tablet Take 1 tablet (25 mg total) by mouth at bedtime as needed (sleep, restlessness). 10 tablet 0   simvastatin  (ZOCOR ) 40 MG tablet Take 1 tablet (40 mg total) by mouth at bedtime. 30 tablet 0   sodium bicarbonate  650 MG tablet Take 1 tablet (650 mg total) by mouth 3 (three) times daily.     traMADol  (ULTRAM ) 50 MG tablet Take 1 tablet (50 mg total) by mouth every 12 (twelve) hours as needed for moderate pain (pain score 4-6). 30 tablet 0   No current facility-administered medications for this visit.   Facility-Administered Medications Ordered in Other Visits  Medication Dose Route Frequency Provider Last Rate Last Admin   epoetin  alfa-epbx (RETACRIT ) injection 10,000 Units  10,000 Units Subcutaneous Once Rao, Archana C, MD       epoetin  alfa-epbx (RETACRIT ) injection 40,000 Units  40,000 Units Subcutaneous Once Melanee Annah BROCKS, MD        Physical Exam: There were no vitals taken for this visit.  GEN: Well nourished, well developed in no acute distress NECK: No JVD; No carotid bruits CARDIAC: {EPRHYTHM:28826}, no murmurs, rubs, gallops RESPIRATORY:  Clear to auscultation without rales, wheezing or rhonchi  ABDOMEN: Soft, non-tender, non-distended EXTREMITIES:  No edema; No deformity   Wt Readings from Last 3 Encounters:  11/22/23 66.2 kg  10/10/23 74.9 kg  09/21/23 72.6 kg     EKG today demonstrates  ***  Echo 07/27/23 demonstrated   1. Left ventricular ejection fraction, by estimation, is 50 to 55%. Left  ventricular ejection fraction by 3D volume is 54 %. Left ventricular  ejection fraction by 2D MOD biplane is 51.3 %. The left ventricle has low  normal function. The left ventricle has no regional wall motion abnormalities. The left ventricular internal cavity size  was mildly dilated. Left ventricular diastolic parameters are consistent with Grade I diastolic dysfunction (impaired relaxation).   2. Right ventricular systolic function is normal. The right ventricular  size is normal.   3. The mitral valve is normal in structure. Mild to moderate mitral valve  regurgitation. No evidence of mitral stenosis.   4. The aortic valve is tricuspid. Aortic valve regurgitation is not  visualized. No aortic stenosis is present.    CHA2DS2-VASc Score = 6  The patient's score is based upon: CHF History: 1 HTN History: 0 Diabetes History: 0 Stroke History: 2 Vascular Disease History: 1 Age Score: 2 Gender Score: 0   {Confirm score is correct.  If not, click here to update score.  REFRESH note.  :1}    ASSESSMENT AND PLAN: Atrial flutter The patient's CHA2DS2-VASc score is 6, indicating a 9.7% annual risk of stroke.   ***rate control    Secondary Hypercoagulable State (ICD10:  D68.69){Click to add to [employer] or  Visit Dx  :[id number]} The patient is at significant risk for stroke/thromboembolism based upon his CHA2DS2-VASc Score of 6.  However, the patient is not on anticoagulation due to his high bleeding risk.  ***    Follow up ***  {Are you ordering a CV Procedure (e.g. stress test, cath, DCCV, TEE, etc)?   Press F2        :[id number]}    [facility] [address] [place], [place] [address] [phone]

## 2023-12-12 ENCOUNTER — Other Ambulatory Visit: Payer: Self-pay

## 2023-12-12 DIAGNOSIS — D649 Anemia, unspecified: Secondary | ICD-10-CM

## 2023-12-13 ENCOUNTER — Telehealth: Payer: Self-pay | Admitting: *Deleted

## 2023-12-13 ENCOUNTER — Other Ambulatory Visit: Payer: Self-pay | Admitting: *Deleted

## 2023-12-13 ENCOUNTER — Ambulatory Visit: Admitting: Oncology

## 2023-12-13 ENCOUNTER — Other Ambulatory Visit: Payer: Self-pay | Admitting: Oncology

## 2023-12-13 ENCOUNTER — Inpatient Hospital Stay: Attending: Oncology

## 2023-12-13 ENCOUNTER — Inpatient Hospital Stay

## 2023-12-13 ENCOUNTER — Ambulatory Visit

## 2023-12-13 DIAGNOSIS — Z862 Personal history of diseases of the blood and blood-forming organs and certain disorders involving the immune mechanism: Secondary | ICD-10-CM

## 2023-12-13 DIAGNOSIS — D469 Myelodysplastic syndrome, unspecified: Secondary | ICD-10-CM

## 2023-12-13 DIAGNOSIS — D649 Anemia, unspecified: Secondary | ICD-10-CM

## 2023-12-13 DIAGNOSIS — Z79899 Other long term (current) drug therapy: Secondary | ICD-10-CM | POA: Diagnosis not present

## 2023-12-13 LAB — CMP (CANCER CENTER ONLY)
ALT: 25 U/L (ref 0–44)
AST: 25 U/L (ref 15–41)
Albumin: 3.1 g/dL — ABNORMAL LOW (ref 3.5–5.0)
Alkaline Phosphatase: 127 U/L — ABNORMAL HIGH (ref 38–126)
Anion gap: 9 (ref 5–15)
BUN: 64 mg/dL — ABNORMAL HIGH (ref 8–23)
CO2: 22 mmol/L (ref 22–32)
Calcium: 8 mg/dL — ABNORMAL LOW (ref 8.9–10.3)
Chloride: 106 mmol/L (ref 98–111)
Creatinine: 3.45 mg/dL — ABNORMAL HIGH (ref 0.61–1.24)
GFR, Estimated: 17 mL/min — ABNORMAL LOW (ref >60)
Glucose, Bld: 106 mg/dL — ABNORMAL HIGH (ref 70–99)
Potassium: 4.9 mmol/L (ref 3.5–5.1)
Sodium: 137 mmol/L (ref 135–145)
Total Bilirubin: 0.7 mg/dL (ref 0.0–1.2)
Total Protein: 6.6 g/dL (ref 6.5–8.1)

## 2023-12-13 LAB — TSH: TSH: 59 u[IU]/mL — ABNORMAL HIGH (ref 0.350–4.500)

## 2023-12-13 LAB — FOLATE: Folate: 36 ng/mL (ref >5.9)

## 2023-12-13 LAB — HEMOGLOBIN AND HEMATOCRIT (CANCER CENTER ONLY)
HCT: 22.6 % — ABNORMAL LOW (ref 39.0–52.0)
Hemoglobin: 7 g/dL — ABNORMAL LOW (ref 13.0–17.0)

## 2023-12-13 LAB — T4, FREE: Free T4: 0.48 ng/dL — ABNORMAL LOW (ref 0.61–1.12)

## 2023-12-13 LAB — PREPARE RBC (CROSSMATCH)

## 2023-12-13 LAB — VITAMIN B12: Vitamin B-12: 762 pg/mL (ref 180–914)

## 2023-12-13 MED ORDER — LUSPATERCEPT-AAMT 75 MG ~~LOC~~ SOLR
1.0000 mg/kg | Freq: Once | SUBCUTANEOUS | Status: AC
  Administered 2023-12-13: 75 mg via SUBCUTANEOUS
  Filled 2023-12-13: qty 1.5

## 2023-12-13 NOTE — Telephone Encounter (Signed)
 PCP office notified of Free T4 count of 0.48 and to please notify patient if any medication adjustments need to be made. RN spoke with Precious.

## 2023-12-13 NOTE — Telephone Encounter (Signed)
Forwarding to PCP as requested.

## 2023-12-13 NOTE — Telephone Encounter (Signed)
 Copied from CRM [id number]. Topic: General - Other >> Dec 13, 2023  3:58 PM Precious C wrote: Reason for CRM: Delon from [facility] called in to report that the patient's T4 thyroid  level is low at 4.8. She requested that this information be sent to Dr. Vicci to determine next steps. This call was made for notification purposes only.

## 2023-12-14 ENCOUNTER — Inpatient Hospital Stay

## 2023-12-14 ENCOUNTER — Other Ambulatory Visit: Payer: Self-pay | Admitting: Family Medicine

## 2023-12-14 DIAGNOSIS — Z79899 Other long term (current) drug therapy: Secondary | ICD-10-CM | POA: Diagnosis not present

## 2023-12-14 DIAGNOSIS — L97522 Non-pressure chronic ulcer of other part of left foot with fat layer exposed: Secondary | ICD-10-CM | POA: Diagnosis not present

## 2023-12-14 DIAGNOSIS — D649 Anemia, unspecified: Secondary | ICD-10-CM

## 2023-12-14 DIAGNOSIS — D469 Myelodysplastic syndrome, unspecified: Secondary | ICD-10-CM | POA: Diagnosis not present

## 2023-12-14 LAB — COPPER, SERUM: Copper: 171 ug/dL — ABNORMAL HIGH (ref 69–132)

## 2023-12-14 LAB — ZINC: Zinc: 80 ug/dL (ref 44–115)

## 2023-12-14 MED ORDER — SODIUM CHLORIDE 0.9% IV SOLUTION
250.0000 mL | INTRAVENOUS | Status: DC
  Administered 2023-12-14: 100 mL via INTRAVENOUS
  Filled 2023-12-14: qty 250

## 2023-12-14 NOTE — Telephone Encounter (Signed)
 Thyroid  was checked 6/24 and meds adjusted. No changes will be made as it's too soon to have rechecked.

## 2023-12-14 NOTE — Progress Notes (Signed)
 CC: Chief Complaint  Patient presents with  . Follow-up    Recheck left 5th toe sore.     [name] presents for follow up of the ulceration on his left fifth toe.  States that has been feeling much better.   Objective: Continued ulceration on the medial aspect of the left fifth toe.  Significantly granulated in since his last visit.  Measures approximately 5 mm diameter pre and postdebridement today but still extends full-thickness with a depth of 2 mm down to the deeper fibers tissue close to the bone.  No significant cellulitis today or sign of infection.  Assessment: Encounter Diagnosis  Name Primary?  . 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  Bacitracin  and sterile bandage applied.  Instructed on daily local wound care with antibiotic ointment and a light bandage.  Patient will return to clinic in 4 weeks for reevaluation.  No orders of the defined types were placed in this encounter.

## 2023-12-15 DIAGNOSIS — I129 Hypertensive chronic kidney disease with stage 1 through stage 4 chronic kidney disease, or unspecified chronic kidney disease: Secondary | ICD-10-CM | POA: Diagnosis not present

## 2023-12-15 DIAGNOSIS — D7581 Myelofibrosis: Secondary | ICD-10-CM | POA: Diagnosis not present

## 2023-12-15 DIAGNOSIS — D61818 Other pancytopenia: Secondary | ICD-10-CM | POA: Diagnosis not present

## 2023-12-15 DIAGNOSIS — L97522 Non-pressure chronic ulcer of other part of left foot with fat layer exposed: Secondary | ICD-10-CM | POA: Diagnosis not present

## 2023-12-15 DIAGNOSIS — L03116 Cellulitis of left lower limb: Secondary | ICD-10-CM | POA: Diagnosis not present

## 2023-12-15 DIAGNOSIS — J90 Pleural effusion, not elsewhere classified: Secondary | ICD-10-CM | POA: Diagnosis not present

## 2023-12-15 LAB — BPAM RBC
Blood Product Expiration Date: 202508072359
ISSUE DATE / TIME: 202507101026
Unit Type and Rh: 5100

## 2023-12-15 LAB — TYPE AND SCREEN
ABO/RH(D): O POS
Antibody Screen: NEGATIVE
Unit division: 0

## 2023-12-15 NOTE — Telephone Encounter (Signed)
 Requested medication (s) are due for refill today: yes  Requested medication (s) are on the active medication list: yes different dose than requested  Last refill:  10/23/23  Future visit scheduled: yes  Notes to clinic:  last rx of 50mg  prescribed by ED provider. Please review for refill     Requested Prescriptions  Pending Prescriptions Disp Refills   metoprolol  succinate (TOPROL -XL) 100 MG 24 hr tablet [Pharmacy Med Name: METOPROLOL  SUCCINATE ER 100 MG TAB] 180 tablet 1    Sig: TAKE ONE TABLET BY MOUTH TWICE DAILY.TAKE OR IMMEDIATELY FOLLOWING A MEAL     Cardiovascular:  Beta Blockers Failed - 12/15/2023  4:35 PM      Failed - Last BP in normal range    BP Readings from Last 1 Encounters:  12/14/23 (!) 147/75         Passed - Last Heart Rate in normal range    Pulse Readings from Last 1 Encounters:  12/14/23 (!) 51         Passed - Valid encounter within last 6 months    Recent Outpatient Visits           2 weeks ago Pleural effusion   [facility] [place], [place], DO   3 months ago Benign hypertensive renal disease   [place] [facility] [place], Megan P, DO   4 months ago Sacral pain   [place] [facility] [place], [place], DO

## 2023-12-19 ENCOUNTER — Telehealth: Payer: Self-pay

## 2023-12-19 NOTE — Telephone Encounter (Signed)
 Copied from CRM [id number]. Topic: General - Other >> Dec 19, 2023 12:16 PM Tobias CROME wrote: Reason for CRM: Vertell with [facility] calling to get clear diagnosis for patient's foot wound. 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 Vertell wanting to know if etiology of ulcer.   Best call back, [phone]

## 2023-12-20 DIAGNOSIS — M9901 Segmental and somatic dysfunction of cervical region: Secondary | ICD-10-CM | POA: Diagnosis not present

## 2023-12-20 DIAGNOSIS — M5033 Other cervical disc degeneration, cervicothoracic region: Secondary | ICD-10-CM | POA: Diagnosis not present

## 2023-12-20 DIAGNOSIS — M9903 Segmental and somatic dysfunction of lumbar region: Secondary | ICD-10-CM | POA: Diagnosis not present

## 2023-12-20 DIAGNOSIS — M5412 Radiculopathy, cervical region: Secondary | ICD-10-CM | POA: Diagnosis not present

## 2023-12-22 ENCOUNTER — Telehealth: Payer: Self-pay | Admitting: Family Medicine

## 2023-12-22 NOTE — Telephone Encounter (Signed)
 Copied from CRM [id number]. Topic: Medicare AWV >> Dec 22, 2023  9:51 AM Nathanel DEL wrote: Reason for CRM: Called LVM 12/21/2023 to schedule AWV. Please schedule office or virtual visits.  Nathanel Paschal; Care Guide Ambulatory Clinical Support [place] l [facility] Direct Dial: [phone]

## 2023-12-25 ENCOUNTER — Ambulatory Visit: Admitting: Family Medicine

## 2023-12-26 DIAGNOSIS — K219 Gastro-esophageal reflux disease without esophagitis: Secondary | ICD-10-CM

## 2023-12-26 DIAGNOSIS — L03116 Cellulitis of left lower limb: Secondary | ICD-10-CM | POA: Diagnosis not present

## 2023-12-26 DIAGNOSIS — M1712 Unilateral primary osteoarthritis, left knee: Secondary | ICD-10-CM

## 2023-12-26 DIAGNOSIS — I4892 Unspecified atrial flutter: Secondary | ICD-10-CM | POA: Diagnosis not present

## 2023-12-26 DIAGNOSIS — K5730 Diverticulosis of large intestine without perforation or abscess without bleeding: Secondary | ICD-10-CM

## 2023-12-26 DIAGNOSIS — E782 Mixed hyperlipidemia: Secondary | ICD-10-CM

## 2023-12-26 DIAGNOSIS — S066X0D Traumatic subarachnoid hemorrhage without loss of consciousness, subsequent encounter: Secondary | ICD-10-CM

## 2023-12-26 DIAGNOSIS — E030 Congenital hypothyroidism with diffuse goiter: Secondary | ICD-10-CM

## 2023-12-26 DIAGNOSIS — D460 Refractory anemia without ring sideroblasts, so stated: Secondary | ICD-10-CM

## 2023-12-26 DIAGNOSIS — I890 Lymphedema, not elsewhere classified: Secondary | ICD-10-CM

## 2023-12-26 DIAGNOSIS — N400 Enlarged prostate without lower urinary tract symptoms: Secondary | ICD-10-CM

## 2023-12-26 DIAGNOSIS — I129 Hypertensive chronic kidney disease with stage 1 through stage 4 chronic kidney disease, or unspecified chronic kidney disease: Secondary | ICD-10-CM | POA: Diagnosis not present

## 2023-12-26 DIAGNOSIS — R161 Splenomegaly, not elsewhere classified: Secondary | ICD-10-CM

## 2023-12-26 DIAGNOSIS — Z87891 Personal history of nicotine dependence: Secondary | ICD-10-CM

## 2023-12-26 DIAGNOSIS — M47812 Spondylosis without myelopathy or radiculopathy, cervical region: Secondary | ICD-10-CM

## 2023-12-26 DIAGNOSIS — D631 Anemia in chronic kidney disease: Secondary | ICD-10-CM

## 2023-12-26 DIAGNOSIS — H33001 Unspecified retinal detachment with retinal break, right eye: Secondary | ICD-10-CM

## 2023-12-26 DIAGNOSIS — Z96611 Presence of right artificial shoulder joint: Secondary | ICD-10-CM

## 2023-12-26 DIAGNOSIS — G3184 Mild cognitive impairment, so stated: Secondary | ICD-10-CM | POA: Diagnosis not present

## 2023-12-26 DIAGNOSIS — Z6820 Body mass index (BMI) 20.0-20.9, adult: Secondary | ICD-10-CM

## 2023-12-26 DIAGNOSIS — Z9181 History of falling: Secondary | ICD-10-CM

## 2023-12-26 DIAGNOSIS — H31003 Unspecified chorioretinal scars, bilateral: Secondary | ICD-10-CM | POA: Diagnosis not present

## 2023-12-26 DIAGNOSIS — I120 Hypertensive chronic kidney disease with stage 5 chronic kidney disease or end stage renal disease: Secondary | ICD-10-CM | POA: Diagnosis not present

## 2023-12-26 DIAGNOSIS — N184 Chronic kidney disease, stage 4 (severe): Secondary | ICD-10-CM

## 2023-12-26 DIAGNOSIS — J90 Pleural effusion, not elsewhere classified: Secondary | ICD-10-CM | POA: Diagnosis not present

## 2023-12-26 DIAGNOSIS — L97522 Non-pressure chronic ulcer of other part of left foot with fat layer exposed: Secondary | ICD-10-CM | POA: Diagnosis not present

## 2023-12-26 DIAGNOSIS — I252 Old myocardial infarction: Secondary | ICD-10-CM

## 2023-12-26 DIAGNOSIS — D61818 Other pancytopenia: Secondary | ICD-10-CM | POA: Diagnosis not present

## 2023-12-26 DIAGNOSIS — E559 Vitamin D deficiency, unspecified: Secondary | ICD-10-CM

## 2023-12-26 DIAGNOSIS — I480 Paroxysmal atrial fibrillation: Secondary | ICD-10-CM

## 2023-12-26 DIAGNOSIS — E440 Moderate protein-calorie malnutrition: Secondary | ICD-10-CM

## 2023-12-26 DIAGNOSIS — D7581 Myelofibrosis: Secondary | ICD-10-CM

## 2023-12-26 DIAGNOSIS — Z79899 Other long term (current) drug therapy: Secondary | ICD-10-CM

## 2023-12-27 DIAGNOSIS — M9901 Segmental and somatic dysfunction of cervical region: Secondary | ICD-10-CM | POA: Diagnosis not present

## 2023-12-27 DIAGNOSIS — M9903 Segmental and somatic dysfunction of lumbar region: Secondary | ICD-10-CM | POA: Diagnosis not present

## 2023-12-27 DIAGNOSIS — M5033 Other cervical disc degeneration, cervicothoracic region: Secondary | ICD-10-CM | POA: Diagnosis not present

## 2023-12-27 DIAGNOSIS — M5412 Radiculopathy, cervical region: Secondary | ICD-10-CM | POA: Diagnosis not present

## 2023-12-28 NOTE — Telephone Encounter (Signed)
 Unfortunately I don't know where it came from. Podiatry may be able to help with this.

## 2023-12-29 ENCOUNTER — Other Ambulatory Visit: Payer: Self-pay | Admitting: Family Medicine

## 2023-12-29 ENCOUNTER — Other Ambulatory Visit: Payer: Self-pay

## 2023-12-29 DIAGNOSIS — J90 Pleural effusion, not elsewhere classified: Secondary | ICD-10-CM | POA: Diagnosis not present

## 2023-12-29 DIAGNOSIS — D61818 Other pancytopenia: Secondary | ICD-10-CM | POA: Diagnosis not present

## 2023-12-29 DIAGNOSIS — I129 Hypertensive chronic kidney disease with stage 1 through stage 4 chronic kidney disease, or unspecified chronic kidney disease: Secondary | ICD-10-CM | POA: Diagnosis not present

## 2023-12-29 DIAGNOSIS — L97522 Non-pressure chronic ulcer of other part of left foot with fat layer exposed: Secondary | ICD-10-CM | POA: Diagnosis not present

## 2023-12-29 DIAGNOSIS — L03116 Cellulitis of left lower limb: Secondary | ICD-10-CM | POA: Diagnosis not present

## 2023-12-29 DIAGNOSIS — D7581 Myelofibrosis: Secondary | ICD-10-CM | POA: Diagnosis not present

## 2023-12-29 NOTE — Telephone Encounter (Signed)
 We were holding this due to dizziness. He no-showed his last appointment. Needs follow up.

## 2023-12-29 NOTE — Telephone Encounter (Signed)
 Copied from CRM [id number]. Topic: Clinical - Medication Refill >> Dec 29, 2023 12:15 PM Sasha H wrote: Medication: metoprolol  tartrate (LOPRESSOR ) 50 MG tablet   Has the patient contacted their pharmacy? Yes (Agent: If no, request that the patient contact the pharmacy for the refill. If patient does not wish to contact the pharmacy document the reason why and proceed with request.) (Agent: If yes, when and what did the pharmacy advise?)  This is the patient's preferred pharmacy:  [facility] - [place], [place] - [address] [place] [address] Phone: [phone] Fax: [phone]  [facility]) - [place], [place] - [address] [place] [address] [place] [address] Phone: [phone] Fax: [phone]  [facility] [place] - [address] [facility] [address] [place] [address] Phone: [phone] Fax: [phone]  [facility] [address] [place] [address] Phone: [phone] Fax: [phone]  [facility] [address] [place] [address] Phone: [phone] Fax: [phone]  Is this the correct pharmacy for this prescription? Yes If no, delete pharmacy and type the correct one.   Has the prescription been filled recently? Yes  Is the patient out of the medication? Yes  Has the patient been seen for an appointment in the last year OR does the patient have an upcoming appointment? Yes  Can we respond through MyChart? Yes  Agent: Please be advised that Rx refills may take up to 3 business days. We ask that you follow-up with your pharmacy.

## 2023-12-29 NOTE — Telephone Encounter (Signed)
 Called and gave verbal orders per Dr. Vicci. While on the phone, Orlinda states that the patient's wife said that the patient needs a refill on his Metoprolol  sent to the pharmacy.

## 2023-12-29 NOTE — Telephone Encounter (Signed)
 Copied from CRM [id number]. Topic: Clinical - Home Health Verbal Orders >> Dec 29, 2023 12:13 PM Sasha H wrote: Caller/Agency: Keegan/ [facility] Callback Number: [phone] Service Requested: Skilled Nursing Frequency:  Any new concerns about the patient? Yes, Pt had a fall last week and has skin tear to right upper extremity

## 2023-12-29 NOTE — Telephone Encounter (Signed)
Scheduled for 9/11

## 2023-12-29 NOTE — Telephone Encounter (Signed)
 I think they're asking for verbal orders- OK to give.

## 2023-12-30 ENCOUNTER — Other Ambulatory Visit: Payer: Self-pay

## 2023-12-30 DIAGNOSIS — E559 Vitamin D deficiency, unspecified: Secondary | ICD-10-CM | POA: Diagnosis not present

## 2023-12-30 DIAGNOSIS — E782 Mixed hyperlipidemia: Secondary | ICD-10-CM | POA: Diagnosis not present

## 2023-12-30 DIAGNOSIS — D7581 Myelofibrosis: Secondary | ICD-10-CM | POA: Diagnosis not present

## 2023-12-30 DIAGNOSIS — I890 Lymphedema, not elsewhere classified: Secondary | ICD-10-CM | POA: Diagnosis not present

## 2023-12-30 DIAGNOSIS — M47812 Spondylosis without myelopathy or radiculopathy, cervical region: Secondary | ICD-10-CM | POA: Diagnosis not present

## 2023-12-30 DIAGNOSIS — D61818 Other pancytopenia: Secondary | ICD-10-CM | POA: Diagnosis not present

## 2023-12-30 DIAGNOSIS — N400 Enlarged prostate without lower urinary tract symptoms: Secondary | ICD-10-CM | POA: Diagnosis not present

## 2023-12-30 DIAGNOSIS — K219 Gastro-esophageal reflux disease without esophagitis: Secondary | ICD-10-CM | POA: Diagnosis not present

## 2023-12-30 DIAGNOSIS — I129 Hypertensive chronic kidney disease with stage 1 through stage 4 chronic kidney disease, or unspecified chronic kidney disease: Secondary | ICD-10-CM | POA: Diagnosis not present

## 2023-12-30 DIAGNOSIS — J90 Pleural effusion, not elsewhere classified: Secondary | ICD-10-CM | POA: Diagnosis not present

## 2023-12-30 DIAGNOSIS — K5730 Diverticulosis of large intestine without perforation or abscess without bleeding: Secondary | ICD-10-CM | POA: Diagnosis not present

## 2023-12-30 DIAGNOSIS — M1712 Unilateral primary osteoarthritis, left knee: Secondary | ICD-10-CM | POA: Diagnosis not present

## 2023-12-30 DIAGNOSIS — H33001 Unspecified retinal detachment with retinal break, right eye: Secondary | ICD-10-CM | POA: Diagnosis not present

## 2023-12-30 DIAGNOSIS — D469 Myelodysplastic syndrome, unspecified: Secondary | ICD-10-CM | POA: Diagnosis not present

## 2023-12-30 DIAGNOSIS — I480 Paroxysmal atrial fibrillation: Secondary | ICD-10-CM | POA: Diagnosis not present

## 2023-12-30 DIAGNOSIS — L03116 Cellulitis of left lower limb: Secondary | ICD-10-CM | POA: Diagnosis not present

## 2023-12-30 DIAGNOSIS — N184 Chronic kidney disease, stage 4 (severe): Secondary | ICD-10-CM | POA: Diagnosis not present

## 2023-12-30 DIAGNOSIS — I4892 Unspecified atrial flutter: Secondary | ICD-10-CM | POA: Diagnosis not present

## 2023-12-30 DIAGNOSIS — S066X0D Traumatic subarachnoid hemorrhage without loss of consciousness, subsequent encounter: Secondary | ICD-10-CM | POA: Diagnosis not present

## 2023-12-30 DIAGNOSIS — D631 Anemia in chronic kidney disease: Secondary | ICD-10-CM | POA: Diagnosis not present

## 2023-12-30 DIAGNOSIS — E039 Hypothyroidism, unspecified: Secondary | ICD-10-CM | POA: Diagnosis not present

## 2023-12-30 DIAGNOSIS — E440 Moderate protein-calorie malnutrition: Secondary | ICD-10-CM | POA: Diagnosis not present

## 2023-12-30 DIAGNOSIS — R161 Splenomegaly, not elsewhere classified: Secondary | ICD-10-CM | POA: Diagnosis not present

## 2023-12-30 DIAGNOSIS — G3184 Mild cognitive impairment, so stated: Secondary | ICD-10-CM | POA: Diagnosis not present

## 2023-12-30 DIAGNOSIS — L97522 Non-pressure chronic ulcer of other part of left foot with fat layer exposed: Secondary | ICD-10-CM | POA: Diagnosis not present

## 2024-01-01 DIAGNOSIS — N184 Chronic kidney disease, stage 4 (severe): Secondary | ICD-10-CM | POA: Diagnosis not present

## 2024-01-01 DIAGNOSIS — N179 Acute kidney failure, unspecified: Secondary | ICD-10-CM | POA: Diagnosis not present

## 2024-01-01 DIAGNOSIS — N281 Cyst of kidney, acquired: Secondary | ICD-10-CM | POA: Diagnosis not present

## 2024-01-01 DIAGNOSIS — E875 Hyperkalemia: Secondary | ICD-10-CM | POA: Diagnosis not present

## 2024-01-01 DIAGNOSIS — N041 Nephrotic syndrome with focal and segmental glomerular lesions: Secondary | ICD-10-CM | POA: Diagnosis not present

## 2024-01-01 DIAGNOSIS — I309 Acute pericarditis, unspecified: Secondary | ICD-10-CM | POA: Diagnosis not present

## 2024-01-01 DIAGNOSIS — I129 Hypertensive chronic kidney disease with stage 1 through stage 4 chronic kidney disease, or unspecified chronic kidney disease: Secondary | ICD-10-CM | POA: Diagnosis not present

## 2024-01-01 DIAGNOSIS — D631 Anemia in chronic kidney disease: Secondary | ICD-10-CM | POA: Diagnosis not present

## 2024-01-01 DIAGNOSIS — R809 Proteinuria, unspecified: Secondary | ICD-10-CM | POA: Diagnosis not present

## 2024-01-01 DIAGNOSIS — N3000 Acute cystitis without hematuria: Secondary | ICD-10-CM | POA: Diagnosis not present

## 2024-01-01 NOTE — Telephone Encounter (Signed)
 Spoke with [name], gave podiatry phone number

## 2024-01-01 NOTE — Telephone Encounter (Signed)
 Requested medications are due for refill today.  unsure  Requested medications are on the active medications list.  yes  Last refill. 10/23/2023   Future visit scheduled.   no  Notes to clinic.  Rx signed by Dr. Marsa    Requested Prescriptions  Pending Prescriptions Disp Refills   metoprolol  tartrate (LOPRESSOR ) 50 MG tablet      Sig: Take 0.5 tablets (25 mg total) by mouth 2 (two) times daily.     Cardiovascular:  Beta Blockers Failed - 01/01/2024 11:20 AM      Failed - Last BP in normal range    BP Readings from Last 1 Encounters:  12/14/23 (!) 147/75         Passed - Last Heart Rate in normal range    Pulse Readings from Last 1 Encounters:  12/14/23 (!) 51         Passed - Valid encounter within last 6 months    Recent Outpatient Visits           1 month ago Pleural effusion   [place] [facility] [place], [place], DO   4 months ago Benign hypertensive renal disease   [place] [place], Megan P, DO   5 months ago Sacral pain   [place] [facility] [place], [place], DO

## 2024-01-03 ENCOUNTER — Inpatient Hospital Stay: Admitting: Hospice and Palliative Medicine

## 2024-01-03 ENCOUNTER — Telehealth: Payer: Self-pay

## 2024-01-03 ENCOUNTER — Inpatient Hospital Stay

## 2024-01-03 DIAGNOSIS — I129 Hypertensive chronic kidney disease with stage 1 through stage 4 chronic kidney disease, or unspecified chronic kidney disease: Secondary | ICD-10-CM | POA: Diagnosis not present

## 2024-01-03 DIAGNOSIS — N281 Cyst of kidney, acquired: Secondary | ICD-10-CM | POA: Diagnosis not present

## 2024-01-03 DIAGNOSIS — N184 Chronic kidney disease, stage 4 (severe): Secondary | ICD-10-CM | POA: Diagnosis not present

## 2024-01-03 DIAGNOSIS — N041 Nephrotic syndrome with focal and segmental glomerular lesions: Secondary | ICD-10-CM | POA: Diagnosis not present

## 2024-01-03 NOTE — Telephone Encounter (Signed)
 Per Secure chat from Nena This pt is on the schedule for tomorrow for possible blood, he did not show for his 9:45 lab appt, he also has an 11am injection appt? His last labs were 7/28 hgb 8.6 and platelets 254. Do you know if he is coming today? If he does need blood I need orders and them released, if not I need him removed from tomorrow's schedule please..  Outbound call to see why patient no showed.  Patient said he wasn't feeling that well this morning and he didn't have anyone to drive him here.  Patient says he's feeling better than earlier today; informed someone will be in touch to reschedule appointments, patient verbalized understanding. Forwarded to scheduling to cancel blood appt for tomorrow and to reschedule injection and/or visit (if needed).

## 2024-01-04 ENCOUNTER — Inpatient Hospital Stay

## 2024-01-08 ENCOUNTER — Telehealth: Payer: Self-pay | Admitting: *Deleted

## 2024-01-08 ENCOUNTER — Other Ambulatory Visit: Payer: Self-pay

## 2024-01-08 ENCOUNTER — Other Ambulatory Visit

## 2024-01-08 ENCOUNTER — Telehealth: Payer: Self-pay | Admitting: Oncology

## 2024-01-08 ENCOUNTER — Ambulatory Visit

## 2024-01-08 DIAGNOSIS — D649 Anemia, unspecified: Secondary | ICD-10-CM

## 2024-01-08 NOTE — Telephone Encounter (Signed)
Can you please look into this?

## 2024-01-08 NOTE — Telephone Encounter (Signed)
 Now has an appointment for 10;15 on this Wednesday 8/4 and has a possible blood transfusion depending on his labs at 8/7 time is 8:30 I told him the dates and times

## 2024-01-08 NOTE — Telephone Encounter (Signed)
 yes

## 2024-01-08 NOTE — Telephone Encounter (Signed)
 Pt wife calling to reschedule the pt appts that she had to cancel on 7/30 due to the pt not feeling well. She states the pt can come in on Tuesday or Wednesday morning. Pt wife is requesting a callback to schedule.

## 2024-01-10 ENCOUNTER — Inpatient Hospital Stay: Attending: Oncology

## 2024-01-10 ENCOUNTER — Other Ambulatory Visit: Payer: Self-pay | Admitting: Oncology

## 2024-01-10 ENCOUNTER — Inpatient Hospital Stay

## 2024-01-10 ENCOUNTER — Encounter: Payer: Self-pay | Admitting: Oncology

## 2024-01-10 VITALS — BP 115/69 | HR 71 | Temp 96.1°F | Wt 157.5 lb

## 2024-01-10 DIAGNOSIS — J90 Pleural effusion, not elsewhere classified: Secondary | ICD-10-CM | POA: Diagnosis not present

## 2024-01-10 DIAGNOSIS — N184 Chronic kidney disease, stage 4 (severe): Secondary | ICD-10-CM | POA: Insufficient documentation

## 2024-01-10 DIAGNOSIS — D649 Anemia, unspecified: Secondary | ICD-10-CM

## 2024-01-10 DIAGNOSIS — L03116 Cellulitis of left lower limb: Secondary | ICD-10-CM | POA: Diagnosis not present

## 2024-01-10 DIAGNOSIS — Z8616 Personal history of COVID-19: Secondary | ICD-10-CM | POA: Diagnosis not present

## 2024-01-10 DIAGNOSIS — Z87891 Personal history of nicotine dependence: Secondary | ICD-10-CM | POA: Diagnosis not present

## 2024-01-10 DIAGNOSIS — M5033 Other cervical disc degeneration, cervicothoracic region: Secondary | ICD-10-CM | POA: Diagnosis not present

## 2024-01-10 DIAGNOSIS — D469 Myelodysplastic syndrome, unspecified: Secondary | ICD-10-CM | POA: Insufficient documentation

## 2024-01-10 DIAGNOSIS — D61818 Other pancytopenia: Secondary | ICD-10-CM | POA: Diagnosis not present

## 2024-01-10 DIAGNOSIS — M9903 Segmental and somatic dysfunction of lumbar region: Secondary | ICD-10-CM | POA: Diagnosis not present

## 2024-01-10 DIAGNOSIS — D7581 Myelofibrosis: Secondary | ICD-10-CM | POA: Diagnosis not present

## 2024-01-10 DIAGNOSIS — L97522 Non-pressure chronic ulcer of other part of left foot with fat layer exposed: Secondary | ICD-10-CM | POA: Diagnosis not present

## 2024-01-10 DIAGNOSIS — M9901 Segmental and somatic dysfunction of cervical region: Secondary | ICD-10-CM | POA: Diagnosis not present

## 2024-01-10 DIAGNOSIS — I129 Hypertensive chronic kidney disease with stage 1 through stage 4 chronic kidney disease, or unspecified chronic kidney disease: Secondary | ICD-10-CM | POA: Diagnosis not present

## 2024-01-10 DIAGNOSIS — M5412 Radiculopathy, cervical region: Secondary | ICD-10-CM | POA: Diagnosis not present

## 2024-01-10 LAB — CMP (CANCER CENTER ONLY)
ALT: 16 U/L (ref 0–44)
AST: 23 U/L (ref 15–41)
Albumin: 3.3 g/dL — ABNORMAL LOW (ref 3.5–5.0)
Alkaline Phosphatase: 109 U/L (ref 38–126)
Anion gap: 10 (ref 5–15)
BUN: 55 mg/dL — ABNORMAL HIGH (ref 8–23)
CO2: 24 mmol/L (ref 22–32)
Calcium: 8.5 mg/dL — ABNORMAL LOW (ref 8.9–10.3)
Chloride: 104 mmol/L (ref 98–111)
Creatinine: 3.34 mg/dL — ABNORMAL HIGH (ref 0.61–1.24)
GFR, Estimated: 18 mL/min — ABNORMAL LOW (ref >60)
Glucose, Bld: 117 mg/dL — ABNORMAL HIGH (ref 70–99)
Potassium: 4.7 mmol/L (ref 3.5–5.1)
Sodium: 138 mmol/L (ref 135–145)
Total Bilirubin: 0.7 mg/dL (ref 0.0–1.2)
Total Protein: 6.4 g/dL — ABNORMAL LOW (ref 6.5–8.1)

## 2024-01-10 LAB — CBC (CANCER CENTER ONLY)
HCT: 23.3 % — ABNORMAL LOW (ref 39.0–52.0)
Hemoglobin: 7.2 g/dL — ABNORMAL LOW (ref 13.0–17.0)
MCH: 29.5 pg (ref 26.0–34.0)
MCHC: 30.9 g/dL (ref 30.0–36.0)
MCV: 95.5 fL (ref 80.0–100.0)
Platelet Count: 202 K/uL (ref 150–400)
RBC: 2.44 MIL/uL — ABNORMAL LOW (ref 4.22–5.81)
RDW: 19.8 % — ABNORMAL HIGH (ref 11.5–15.5)
WBC Count: 6.5 K/uL (ref 4.0–10.5)
nRBC: 1.2 % — ABNORMAL HIGH (ref 0.0–0.2)

## 2024-01-10 LAB — PREPARE RBC (CROSSMATCH)

## 2024-01-10 MED ORDER — LUSPATERCEPT-AAMT 75 MG ~~LOC~~ SOLR: 1.0000 mg/kg | Freq: Once | SUBCUTANEOUS | Status: DC

## 2024-01-10 MED ORDER — LUSPATERCEPT-AAMT 75 MG ~~LOC~~ SOLR
1.3300 mg/kg | Freq: Once | SUBCUTANEOUS | Status: AC
  Administered 2024-01-10: 100 mg via SUBCUTANEOUS
  Filled 2024-01-10: qty 1.5

## 2024-01-11 ENCOUNTER — Inpatient Hospital Stay

## 2024-01-11 ENCOUNTER — Other Ambulatory Visit: Payer: Self-pay

## 2024-01-11 DIAGNOSIS — D469 Myelodysplastic syndrome, unspecified: Secondary | ICD-10-CM | POA: Diagnosis not present

## 2024-01-11 DIAGNOSIS — D649 Anemia, unspecified: Secondary | ICD-10-CM

## 2024-01-11 DIAGNOSIS — Z87891 Personal history of nicotine dependence: Secondary | ICD-10-CM | POA: Diagnosis not present

## 2024-01-11 DIAGNOSIS — Z8616 Personal history of COVID-19: Secondary | ICD-10-CM | POA: Diagnosis not present

## 2024-01-11 DIAGNOSIS — N184 Chronic kidney disease, stage 4 (severe): Secondary | ICD-10-CM | POA: Diagnosis not present

## 2024-01-11 MED ORDER — ACETAMINOPHEN 325 MG PO TABS
650.0000 mg | ORAL_TABLET | Freq: Once | ORAL | Status: AC
  Administered 2024-01-11: 650 mg via ORAL
  Filled 2024-01-11: qty 2

## 2024-01-11 MED ORDER — SODIUM CHLORIDE 0.9% IV SOLUTION
250.0000 mL | INTRAVENOUS | Status: DC
  Administered 2024-01-11: 100 mL via INTRAVENOUS
  Filled 2024-01-11: qty 250

## 2024-01-12 LAB — TYPE AND SCREEN
ABO/RH(D): O POS
Antibody Screen: NEGATIVE
Unit division: 0

## 2024-01-12 LAB — BPAM RBC
Blood Product Expiration Date: 202508302359
ISSUE DATE / TIME: 202508070859
Unit Type and Rh: 5100

## 2024-01-24 ENCOUNTER — Inpatient Hospital Stay (HOSPITAL_BASED_OUTPATIENT_CLINIC_OR_DEPARTMENT_OTHER): Admitting: Oncology

## 2024-01-24 ENCOUNTER — Encounter: Payer: Self-pay | Admitting: Oncology

## 2024-01-24 ENCOUNTER — Inpatient Hospital Stay

## 2024-01-24 ENCOUNTER — Ambulatory Visit

## 2024-01-24 VITALS — BP 153/85 | HR 72 | Temp 96.6°F | Resp 18 | Ht 71.0 in | Wt 162.6 lb

## 2024-01-24 DIAGNOSIS — M5033 Other cervical disc degeneration, cervicothoracic region: Secondary | ICD-10-CM | POA: Diagnosis not present

## 2024-01-24 DIAGNOSIS — Z87891 Personal history of nicotine dependence: Secondary | ICD-10-CM | POA: Diagnosis not present

## 2024-01-24 DIAGNOSIS — D469 Myelodysplastic syndrome, unspecified: Secondary | ICD-10-CM

## 2024-01-24 DIAGNOSIS — Z79899 Other long term (current) drug therapy: Secondary | ICD-10-CM

## 2024-01-24 DIAGNOSIS — M5412 Radiculopathy, cervical region: Secondary | ICD-10-CM | POA: Diagnosis not present

## 2024-01-24 DIAGNOSIS — M9903 Segmental and somatic dysfunction of lumbar region: Secondary | ICD-10-CM | POA: Diagnosis not present

## 2024-01-24 DIAGNOSIS — N184 Chronic kidney disease, stage 4 (severe): Secondary | ICD-10-CM | POA: Diagnosis not present

## 2024-01-24 DIAGNOSIS — Z8616 Personal history of COVID-19: Secondary | ICD-10-CM | POA: Diagnosis not present

## 2024-01-24 DIAGNOSIS — M9901 Segmental and somatic dysfunction of cervical region: Secondary | ICD-10-CM | POA: Diagnosis not present

## 2024-01-24 LAB — CBC WITH DIFFERENTIAL (CANCER CENTER ONLY)
Abs Immature Granulocytes: 0.3 K/uL — ABNORMAL HIGH (ref 0.00–0.07)
Basophils Absolute: 0 K/uL (ref 0.0–0.1)
Basophils Relative: 0 %
Eosinophils Absolute: 0 K/uL (ref 0.0–0.5)
Eosinophils Relative: 0 %
HCT: 26 % — ABNORMAL LOW (ref 39.0–52.0)
Hemoglobin: 8.1 g/dL — ABNORMAL LOW (ref 13.0–17.0)
Immature Granulocytes: 5 %
Lymphocytes Relative: 20 %
Lymphs Abs: 1.3 K/uL (ref 0.7–4.0)
MCH: 30.1 pg (ref 26.0–34.0)
MCHC: 31.2 g/dL (ref 30.0–36.0)
MCV: 96.7 fL (ref 80.0–100.0)
Monocytes Absolute: 0.8 K/uL (ref 0.1–1.0)
Monocytes Relative: 11 %
Neutro Abs: 4.2 K/uL (ref 1.7–7.7)
Neutrophils Relative %: 64 %
Platelet Count: 223 K/uL (ref 150–400)
RBC: 2.69 MIL/uL — ABNORMAL LOW (ref 4.22–5.81)
RDW: 19.2 % — ABNORMAL HIGH (ref 11.5–15.5)
WBC Count: 6.6 K/uL (ref 4.0–10.5)
nRBC: 1.5 % — ABNORMAL HIGH (ref 0.0–0.2)

## 2024-01-24 LAB — CMP (CANCER CENTER ONLY)
ALT: 22 U/L (ref 0–44)
AST: 26 U/L (ref 15–41)
Albumin: 3.3 g/dL — ABNORMAL LOW (ref 3.5–5.0)
Alkaline Phosphatase: 113 U/L (ref 38–126)
Anion gap: 10 (ref 5–15)
BUN: 57 mg/dL — ABNORMAL HIGH (ref 8–23)
CO2: 20 mmol/L — ABNORMAL LOW (ref 22–32)
Calcium: 8.3 mg/dL — ABNORMAL LOW (ref 8.9–10.3)
Chloride: 108 mmol/L (ref 98–111)
Creatinine: 3.69 mg/dL — ABNORMAL HIGH (ref 0.61–1.24)
GFR, Estimated: 16 mL/min — ABNORMAL LOW (ref >60)
Glucose, Bld: 123 mg/dL — ABNORMAL HIGH (ref 70–99)
Potassium: 4.7 mmol/L (ref 3.5–5.1)
Sodium: 138 mmol/L (ref 135–145)
Total Bilirubin: 0.6 mg/dL (ref 0.0–1.2)
Total Protein: 6.5 g/dL (ref 6.5–8.1)

## 2024-01-24 LAB — SAMPLE TO BLOOD BANK

## 2024-01-24 MED ORDER — LUSPATERCEPT-AAMT 75 MG ~~LOC~~ SOLR
1.3300 mg/kg | Freq: Once | SUBCUTANEOUS | Status: AC
  Administered 2024-01-24: 100 mg via SUBCUTANEOUS
  Filled 2024-01-24: qty 0.5

## 2024-01-24 NOTE — Progress Notes (Signed)
 After discussion with provider, switch Reblozyl  dose for 8/20 to 1.33 mg/kg in accordance with Package Insert - recommends to wait on increasing dose to 1.75 mg/kg q3wks when patient is not RBC transfusion free after at least 2 consecutive doses (6 wks) at 1.33 mg/kg.  8/20 would be the second dose at 1.33 mg/kg.  Can evaluate response at next appt.

## 2024-01-24 NOTE — Progress Notes (Signed)
 Hematology/Oncology Consult note [facility]  [phone] [phone]  Patient Care Team: Vicci Duwaine SQUIBB, DO as PCP - General (Family Medicine) Perla Evalene PARAS, MD as PCP - Cardiology (Cardiology) Etha Quinten Ludwig, MD as Referring Physician (Hematology) Alvia Norleen BIRCH, MD as Consulting Physician (Ophthalmology) Rosemarie Eather RAMAN, MD as Consulting Physician (Neurology) Nieves Cough, MD as Consulting Physician (Urology) Perla Evalene PARAS, MD as Consulting Physician (Cardiology) Twylla Glendia BROCKS, MD (Urology) Melanee Annah BROCKS, MD as Consulting Physician (Oncology) Willma Camelia CROME, RN (Inactive) as [facility] Care Management   Name of the patient: [name]  [id number]  1940-12-31   Date of visit: 01/24/24  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  At that time he was found to have a mild splenomegaly of 15.8 cm.DIPPS score is 63 (age 71- 1, hemoglobin less than 10- 2) and score of 4 if 1% circulating blasts included from 07/17/2014.   Bone marrow on 06/11/2014 was most consistent with primary myelofibrosis.  Bone marrow biopsy showed 1% abnormal cells: CD45+, CD5+, CD10, CD11c+/-, CD19+, CD2-+, (dim), CD22+ (dim, CD23+, CD38-/+, FMC7-, HLA-DR+, sig lambda+(dim).  Blasts were not increased 1.2%; hypercellular for age: 62%; JAK2 V617F mutation was negative.  CALR mutation positive.  Flow cytometry included about 1% CLL/SLL phenotype cells (CD5+) of uncertain significance and some infiltrate into the marrow with increased atypical megakaryocytes.  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  MDS FISH panel was negative.   Patient also follows up with [facility]  benign hematology Dr. Margart Ruddy for his anemia.  Patient's hemoglobin was drifting down to the eights and was started on EPO in October 2022.   Patient has had worsening anemia requiring intermittent blood transfusions despite receiving Retacrit  since October 2024.  PatientUnderwent a repeat bone marrow biopsy in February 2025.  Findings were consistent with myelofibrosis with evidence of possible dysplasia and increased ringed sideroblasts and questionable myeloid dysgranulopoiesis.  Cytogenetic studies could not be completed due to no metaphase cells available for analysis.  [facility]o[employer] profile was also not completed due to limited specimen and poor quality DNA in the specimen.  Peripheral blood intelligen myeloid testing showed SF 3 B1 mutation and CAL R mutation  Luspatercept  was started in June 2025  Interval history-after starting luspatercept  in June 2025, patient has only needed blood transfusion once.  Overall his quality of life is improved and he is back to driving and performing his own ADLs.  He hopes to get back to work at some point.  States that he is working with physical therapy and still trying to get his mobility and balance back.  Does not report any shortness of breath  ECOG PS- 2 Pain scale- 0  Review of systems- Review of Systems  Constitutional:  Positive for malaise/fatigue. Negative for chills, fever and weight loss.  HENT:  Negative for congestion, ear discharge and nosebleeds.   Eyes:  Negative for blurred vision.  Respiratory:  Negative for cough, hemoptysis, sputum production, shortness of breath and wheezing.   Cardiovascular:  Negative for chest pain, palpitations, orthopnea and claudication.  Gastrointestinal:  Negative for abdominal pain, blood in stool, constipation, diarrhea, heartburn, melena, nausea and vomiting.  Genitourinary:  Negative for dysuria, flank pain, frequency, hematuria and urgency.  Musculoskeletal:  Negative for back pain, joint pain and  myalgias.  Skin:  Negative for rash.  Neurological:  Negative for dizziness, tingling, focal weakness, seizures, weakness and headaches.  Endo/Heme/Allergies:  Does not bruise/bleed easily.  Psychiatric/Behavioral:  Negative for depression and suicidal ideas. The patient does not have insomnia.       Allergies  Allergen Reactions   Quinolones Other (See Comments)    Patient with aortic aneurysm. Use of quinolones are considered to be contraindicated due to risk of aortic rupture.   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   right forearm, [facility]   Stroke (HCC) 11/2007   R brain subcortical infarct     Past Surgical History:  Procedure Laterality Date   ASPIRATION / INJECTION RENAL CYST  07/08/2015   BACK SURGERY     approx 20- 25 years ago   COLONOSCOPY     EYE SURGERY     cataract both eyes   GAS INSERTION  08/11/2011   Procedure: INSERTION OF GAS;  Surgeon: Norleen JONETTA Ku, MD;  Location: [facility] OR;  Service: Ophthalmology;  Laterality: Right;  C3F8   IR BONE MARROW BIOPSY & ASPIRATION  08/03/2023   IR FLUORO GUIDE CV LINE RIGHT  10/23/2023   IR REMOVAL TUN CV CATH W/O FL  11/07/2023   IR THORACENTESIS ASP PLEURAL SPACE W/IMG GUIDE  09/22/2023   IR THORACENTESIS ASP PLEURAL SPACE W/IMG GUIDE  10/02/2023   LEFT HEART CATH AND CORONARY ANGIOGRAPHY N/A 05/02/2023   Procedure: LEFT HEART CATH AND CORONARY ANGIOGRAPHY;  Surgeon: Mady Bruckner, MD;  Location: [facility] INVASIVE CV LAB;  Service: Cardiovascular;  Laterality: N/A;   REVERSE SHOULDER ARTHROPLASTY Right 04/13/2023   Procedure: REVERSE SHOULDER ARTHROPLASTY;  Surgeon: Edie Norleen PARAS, MD;  Location: [facility] ORS;  Service: Orthopedics;  Laterality: Right;   SCLERAL BUCKLE  08/11/2011   Procedure: SCLERAL BUCKLE;  Surgeon: Norleen JONETTA Ku, MD;  Location: [facility] OR;  Service: Ophthalmology;  Laterality: Right;   VARICOSE VEIN SURGERY      Social History   Socioeconomic History   Marital status: Married    Spouse name: Maceo    Number of children: 2   Years of education: 12+   Highest education level: Some college, no degree  Occupational History   Occupation: Fish farm manager: OTHER    Comment: community   Occupation: SELF EMPLOYED    Employer: SELF EMPLOYED  Tobacco Use   Smoking status: Former    Current packs/day: 0.00    Types: Cigarettes    Quit date: 06/06/2010    Years since quitting: 13.6   Smokeless tobacco: Never  Vaping Use   Vaping status: Never Used  Substance and Sexual Activity   Alcohol use: No    Alcohol/week: 0.0 standard drinks of alcohol   Drug use: No  Sexual activity: Not Currently  Other Topics Concern   Not on file  Social History Narrative   Pt lives at home with his family.   Caffeine Use- 2 cups daily   Patient has 2 children.    Patient has some college.    Patient is right handed.          Works full time   Social Drivers of Corporate investment banker Strain: Low Risk  (07/12/2021)   Overall Financial Resource Strain (CARDIA)    Difficulty of Paying Living Expenses: Not hard at all  Food Insecurity: No Food Insecurity (10/16/2023)   Hunger Vital Sign    Worried About Running Out of Food in the Last Year: Never true    Ran Out of Food in the Last Year: Never true  Transportation Needs: No Transportation Needs (10/16/2023)   PRAPARE - Administrator, Civil Service (Medical): No    Lack of Transportation  (Non-Medical): No  Physical Activity: Inactive (07/12/2021)   Exercise Vital Sign    Days of Exercise per Week: 0 days    Minutes of Exercise per Session: 0 min  Stress: No Stress Concern Present (07/12/2021)   [employer] of Occupational Health - Occupational Stress Questionnaire    Feeling of Stress : Not at all  Social Connections: Socially Integrated (10/16/2023)   Social Connection and Isolation Panel    Frequency of Communication with Friends and Family: More than three times a week    Frequency of Social Gatherings with Friends and Family: More than three times a week    Attends Religious Services: More than 4 times per year    Active Member of [employer] or Organizations: Yes    Attends Engineer, structural: More than 4 times per year    Marital Status: Married  Catering manager Violence: Not At Risk (10/16/2023)   Humiliation, Afraid, Rape, and Kick questionnaire    Fear of Current or Ex-Partner: No    Emotionally Abused: No    Physically Abused: No    Sexually Abused: No    Family History  Problem Relation Age of Onset   Heart disease Father    Dementia Sister    Diabetes Son    Kidney disease Neg Hx    Prostate cancer Neg Hx      Current Outpatient Medications:    acetaminophen  (TYLENOL ) 500 MG tablet, Take 1,000 mg by mouth every 6 (six) hours as needed for mild pain (pain score 1-3)., Disp: , Rfl:    amiodarone  (PACERONE ) 200 MG tablet, Take 1 tablet (200 mg total) by mouth daily., Disp: 30 tablet, Rfl: 0   cyanocobalamin  1000 MCG tablet, Take 1 tablet (1,000 mcg total) by mouth daily., Disp: 30 tablet, Rfl: 0   Dextromethorphan -guaiFENesin  20-200 MG/20ML LIQD, Take 10 mLs by mouth every 4 (four) hours as needed., Disp: 118 mL, Rfl: 0   DULoxetine  (CYMBALTA ) 20 MG capsule, Take 1 capsule (20 mg total) by mouth at bedtime., Disp: 30 capsule, Rfl: 0   furosemide  (LASIX ) 40 MG tablet, TAKE 1 TABLET BY MOUTH DAILY, Disp: 90 tablet, Rfl: 0   latanoprost  (XALATAN )  0.005 % ophthalmic solution, , Disp: , Rfl:    levothyroxine  (SYNTHROID ) 150 MCG tablet, Take 1 tablet (150 mcg total) by mouth daily before breakfast., Disp: 30 tablet, Rfl: 1   lidocaine  (LIDODERM ) 5 %, Place 1 patch onto the skin daily. Remove & Discard patch within 12 hours or as directed by MD, Disp:  30 patch, Rfl: 0   metoprolol  tartrate (LOPRESSOR ) 50 MG tablet, Take 0.5 tablets (25 mg total) by mouth 2 (two) times daily., Disp: , Rfl:    Multiple Vitamin (MULITIVITAMIN WITH MINERALS) TABS, Take 1 tablet by mouth in the morning., Disp: , Rfl:    ondansetron  (ZOFRAN ) 4 MG tablet, Take 1 tablet (4 mg total) by mouth every 6 (six) hours as needed for nausea., Disp: 20 tablet, Rfl: 0   pantoprazole  (PROTONIX ) 40 MG tablet, Take 1 tablet (40 mg total) by mouth daily., Disp: 30 tablet, Rfl: 0   QUEtiapine  (SEROQUEL ) 25 MG tablet, Take 1 tablet (25 mg total) by mouth at bedtime as needed (sleep, restlessness)., Disp: 10 tablet, Rfl: 0   simvastatin  (ZOCOR ) 40 MG tablet, Take 1 tablet (40 mg total) by mouth at bedtime., Disp: 30 tablet, Rfl: 0   sodium bicarbonate  650 MG tablet, Take 1 tablet (650 mg total) by mouth 3 (three) times daily., Disp: , Rfl:    tamsulosin  (FLOMAX ) 0.4 MG CAPS capsule, Take 0.4 mg by mouth., Disp: , Rfl:    traMADol  (ULTRAM ) 50 MG tablet, Take 1 tablet (50 mg total) by mouth every 12 (twelve) hours as needed for moderate pain (pain score 4-6)., Disp: 30 tablet, Rfl: 0   [Paused] lenalidomide  (REVLIMID ) 5 MG capsule, Take 1 capsule (5 mg total) by mouth daily. Take for 21 days, then hold for 7 days. Repeat every 28 days. (Patient not taking: Reported on 11/22/2023), Disp: 21 capsule, Rfl: 0   metoprolol  succinate (TOPROL -XL) 25 MG 24 hr tablet, Take 25 mg by mouth. (Patient not taking: Reported on 01/24/2024), Disp: , Rfl:  No current facility-administered medications for this visit.  Facility-Administered Medications Ordered in Other Visits:    epoetin  alfa-epbx (RETACRIT )  injection 10,000 Units, 10,000 Units, Subcutaneous, Once, Melanee Annah BROCKS, MD   epoetin  alfa-epbx (RETACRIT ) injection 40,000 Units, 40,000 Units, Subcutaneous, Once, Melanee Annah BROCKS, MD  Physical exam:  Vitals:   01/24/24 1029 01/24/24 1042  BP: (!) 159/92 (!) 153/85  Pulse: 98 72  Resp: 18   Temp: (!) 96.6 F (35.9 C)   TempSrc: Tympanic   SpO2: 100%   Weight: 162 lb 9.6 oz (73.8 kg)   Height: 5' 11 (1.803 m)    Physical Exam Constitutional:      Comments: Sitting in a wheelchair. Appears in no acute distress  Cardiovascular:     Rate and Rhythm: Normal rate and regular rhythm.     Heart sounds: Normal heart sounds.  Pulmonary:     Effort: Pulmonary effort is normal.     Breath sounds: Normal breath sounds.  Skin:    General: Skin is warm and dry.  Neurological:     Mental Status: He is alert and oriented to person, place, and time.      I have personally reviewed labs listed below:    Latest Ref Rng & Units 01/24/2024   10:04 AM  CMP  Glucose 70 - 99 mg/dL 876   BUN 8 - 23 mg/dL 57   Creatinine 9.38 - 1.24 mg/dL 6.30   Sodium 864 - 854 mmol/L 138   Potassium 3.5 - 5.1 mmol/L 4.7   Chloride 98 - 111 mmol/L 108   CO2 22 - 32 mmol/L 20   Calcium  8.9 - 10.3 mg/dL 8.3   Total Protein 6.5 - 8.1 g/dL 6.5   Total Bilirubin 0.0 - 1.2 mg/dL 0.6   Alkaline Phos 38 - 126 U/L 113   AST  15 - 41 U/L 26   ALT 0 - 44 U/L 22       Latest Ref Rng & Units 01/24/2024   10:04 AM  CBC  WBC 4.0 - 10.5 K/uL 6.6   Hemoglobin 13.0 - 17.0 g/dL 8.1   Hematocrit 60.9 - 52.0 % 26.0   Platelets 150 - 400 K/uL 223      Assessment and plan- Patient is a 83 y.o. 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  Hemoglobin today is 8.1 and he does not require any blood transfusion at this time.  If he continues to  remain transfusion free I will consider keeping the same dose of 1.33 mg/kg.  However if he ends up needing a blood transfusion I will increase his dose to 1.75 mg/kg.  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  However given the improvement in his hemoglobin and his quality of life I will continue the same at this time.  Luspatercept  today and continue it with H/H Q3 weeks. See me in 3 months with cbc ferritin and iron studies and cmp  H/h and hold tube for possible transfusion in 2, 4 and 6 weeks    Visit Diagnosis 1. Myelodysplasia (myelodysplastic syndrome) (HCC)   2. High risk medication use      Dr. Annah Skene, MD, MPH [facility] at [facility] [phone] 01/24/2024 1:52 PM

## 2024-01-25 ENCOUNTER — Encounter: Payer: Self-pay | Admitting: Oncology

## 2024-01-25 ENCOUNTER — Inpatient Hospital Stay

## 2024-01-25 ENCOUNTER — Other Ambulatory Visit: Payer: Self-pay

## 2024-01-26 ENCOUNTER — Other Ambulatory Visit: Payer: Self-pay

## 2024-01-26 DIAGNOSIS — D7581 Myelofibrosis: Secondary | ICD-10-CM | POA: Diagnosis not present

## 2024-01-26 DIAGNOSIS — J90 Pleural effusion, not elsewhere classified: Secondary | ICD-10-CM | POA: Diagnosis not present

## 2024-01-26 DIAGNOSIS — I129 Hypertensive chronic kidney disease with stage 1 through stage 4 chronic kidney disease, or unspecified chronic kidney disease: Secondary | ICD-10-CM | POA: Diagnosis not present

## 2024-01-26 DIAGNOSIS — D61818 Other pancytopenia: Secondary | ICD-10-CM | POA: Diagnosis not present

## 2024-01-26 DIAGNOSIS — L97522 Non-pressure chronic ulcer of other part of left foot with fat layer exposed: Secondary | ICD-10-CM | POA: Diagnosis not present

## 2024-01-26 DIAGNOSIS — L03116 Cellulitis of left lower limb: Secondary | ICD-10-CM | POA: Diagnosis not present

## 2024-02-07 ENCOUNTER — Inpatient Hospital Stay: Attending: Oncology

## 2024-02-07 ENCOUNTER — Telehealth: Payer: Self-pay

## 2024-02-07 ENCOUNTER — Inpatient Hospital Stay

## 2024-02-07 DIAGNOSIS — D469 Myelodysplastic syndrome, unspecified: Secondary | ICD-10-CM | POA: Diagnosis not present

## 2024-02-07 LAB — SAMPLE TO BLOOD BANK

## 2024-02-07 LAB — HEMOGLOBIN AND HEMATOCRIT (CANCER CENTER ONLY)
HCT: 27.1 % — ABNORMAL LOW (ref 39.0–52.0)
Hemoglobin: 8.3 g/dL — ABNORMAL LOW (ref 13.0–17.0)

## 2024-02-07 NOTE — Telephone Encounter (Signed)
 Hemoglobin 8.3; per Dr. Melanee no blood transfusion. Spoke to [place] (spouse) and informed of above.  No further questions / concerns at this time.

## 2024-02-08 ENCOUNTER — Inpatient Hospital Stay

## 2024-02-12 ENCOUNTER — Other Ambulatory Visit: Payer: Self-pay

## 2024-02-13 ENCOUNTER — Other Ambulatory Visit: Payer: Self-pay

## 2024-02-13 DIAGNOSIS — D469 Myelodysplastic syndrome, unspecified: Secondary | ICD-10-CM

## 2024-02-14 ENCOUNTER — Inpatient Hospital Stay

## 2024-02-14 ENCOUNTER — Encounter: Payer: Self-pay | Admitting: Oncology

## 2024-02-14 ENCOUNTER — Inpatient Hospital Stay (HOSPITAL_BASED_OUTPATIENT_CLINIC_OR_DEPARTMENT_OTHER): Admitting: Oncology

## 2024-02-14 VITALS — BP 134/83 | HR 69 | Temp 96.8°F | Resp 19 | Ht 71.0 in | Wt 151.7 lb

## 2024-02-14 DIAGNOSIS — M9903 Segmental and somatic dysfunction of lumbar region: Secondary | ICD-10-CM | POA: Diagnosis not present

## 2024-02-14 DIAGNOSIS — Z5111 Encounter for antineoplastic chemotherapy: Secondary | ICD-10-CM

## 2024-02-14 DIAGNOSIS — D469 Myelodysplastic syndrome, unspecified: Secondary | ICD-10-CM

## 2024-02-14 DIAGNOSIS — M9901 Segmental and somatic dysfunction of cervical region: Secondary | ICD-10-CM | POA: Diagnosis not present

## 2024-02-14 DIAGNOSIS — M5033 Other cervical disc degeneration, cervicothoracic region: Secondary | ICD-10-CM | POA: Diagnosis not present

## 2024-02-14 DIAGNOSIS — M5412 Radiculopathy, cervical region: Secondary | ICD-10-CM | POA: Diagnosis not present

## 2024-02-14 LAB — CMP (CANCER CENTER ONLY)
ALT: 17 U/L (ref 0–44)
AST: 20 U/L (ref 15–41)
Albumin: 3.4 g/dL — ABNORMAL LOW (ref 3.5–5.0)
Alkaline Phosphatase: 113 U/L (ref 38–126)
Anion gap: 11 (ref 5–15)
BUN: 55 mg/dL — ABNORMAL HIGH (ref 8–23)
CO2: 21 mmol/L — ABNORMAL LOW (ref 22–32)
Calcium: 8.1 mg/dL — ABNORMAL LOW (ref 8.9–10.3)
Chloride: 104 mmol/L (ref 98–111)
Creatinine: 3.57 mg/dL — ABNORMAL HIGH (ref 0.61–1.24)
GFR, Estimated: 16 mL/min — ABNORMAL LOW (ref >60)
Glucose, Bld: 95 mg/dL (ref 70–99)
Potassium: 5.2 mmol/L — ABNORMAL HIGH (ref 3.5–5.1)
Sodium: 136 mmol/L (ref 135–145)
Total Bilirubin: 0.6 mg/dL (ref 0.0–1.2)
Total Protein: 6.2 g/dL — ABNORMAL LOW (ref 6.5–8.1)

## 2024-02-14 LAB — CBC WITH DIFFERENTIAL (CANCER CENTER ONLY)
Abs Immature Granulocytes: 0.34 K/uL — ABNORMAL HIGH (ref 0.00–0.07)
Basophils Absolute: 0 K/uL (ref 0.0–0.1)
Basophils Relative: 0 %
Eosinophils Absolute: 0 K/uL (ref 0.0–0.5)
Eosinophils Relative: 1 %
HCT: 25.1 % — ABNORMAL LOW (ref 39.0–52.0)
Hemoglobin: 7.8 g/dL — ABNORMAL LOW (ref 13.0–17.0)
Immature Granulocytes: 5 %
Lymphocytes Relative: 20 %
Lymphs Abs: 1.3 K/uL (ref 0.7–4.0)
MCH: 29.9 pg (ref 26.0–34.0)
MCHC: 31.1 g/dL (ref 30.0–36.0)
MCV: 96.2 fL (ref 80.0–100.0)
Monocytes Absolute: 0.8 K/uL (ref 0.1–1.0)
Monocytes Relative: 12 %
Neutro Abs: 4.1 K/uL (ref 1.7–7.7)
Neutrophils Relative %: 62 %
Platelet Count: 241 K/uL (ref 150–400)
RBC: 2.61 MIL/uL — ABNORMAL LOW (ref 4.22–5.81)
RDW: 19.1 % — ABNORMAL HIGH (ref 11.5–15.5)
WBC Count: 6.5 K/uL (ref 4.0–10.5)
nRBC: 1.4 % — ABNORMAL HIGH (ref 0.0–0.2)

## 2024-02-14 LAB — SAMPLE TO BLOOD BANK

## 2024-02-14 MED ORDER — LUSPATERCEPT-AAMT 75 MG ~~LOC~~ SOLR
125.0000 mg | Freq: Once | SUBCUTANEOUS | Status: AC
  Administered 2024-02-14: 125 mg via SUBCUTANEOUS
  Filled 2024-02-14: qty 1.5

## 2024-02-14 MED ORDER — LUSPATERCEPT-AAMT 75 MG ~~LOC~~ SOLR
1.7500 mg/kg | Freq: Once | SUBCUTANEOUS | Status: DC
  Filled 2024-02-14: qty 2.6

## 2024-02-14 NOTE — Progress Notes (Signed)
 Patient states he's doing okay. He also mentioned he had a fall on his way in this morning but is feeling okay and that this was his first fall in awhile. He would like insight on what he can take regarding his issues with uncontrolled loose stools.

## 2024-02-14 NOTE — Progress Notes (Signed)
 Hematology/Oncology Consult note [facility]  [phone] [phone]  Patient Care Team: Vicci Duwaine SQUIBB, DO as PCP - General (Family Medicine) Perla Evalene PARAS, MD as PCP - Cardiology (Cardiology) Etha Quinten Ludwig, MD as Referring Physician (Hematology) Alvia Norleen BIRCH, MD as Consulting Physician (Ophthalmology) Rosemarie Eather RAMAN, MD as Consulting Physician (Neurology) Nieves Cough, MD as Consulting Physician (Urology) Perla Evalene PARAS, MD as Consulting Physician (Cardiology) Twylla Glendia BROCKS, MD (Urology) Melanee Annah BROCKS, MD as Consulting Physician (Oncology) Willma Camelia CROME, RN (Inactive) as [facility] Care Management   Name of the patient: [name]  [id number]  1940-09-12   Date of visit: 02/14/24  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  At that time he was found to have a mild splenomegaly of 15.8 cm.DIPPS score is 53 (age 35- 1, hemoglobin less than 10- 2) and score of 4 if 1% circulating blasts included from 07/17/2014.   Bone marrow on 06/11/2014 was most consistent with primary myelofibrosis.  Bone marrow biopsy showed 1% abnormal cells: CD45+, CD5+, CD10, CD11c+/-, CD19+, CD2-+, (dim), CD22+ (dim, CD23+, CD38-/+, FMC7-, HLA-DR+, sig lambda+(dim).  Blasts were not increased 1.2%; hypercellular for age: 57%; JAK2 V617F mutation was negative.  CALR mutation positive.  Flow cytometry included about 1% CLL/SLL phenotype cells (CD5+) of uncertain significance and some infiltrate into the marrow with increased atypical megakaryocytes.  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  MDS FISH panel was negative.   Patient also follows up with [facility] benign hematology Dr. Margart Ruddy for his  anemia.  Patient's hemoglobin was drifting down to the eights and was started on EPO in October 2022.   Patient has had worsening anemia requiring intermittent blood transfusions despite receiving Retacrit  since October 2024.  PatientUnderwent a repeat bone marrow biopsy in February 2025.  Findings were consistent with myelofibrosis with evidence of possible dysplasia and increased ringed sideroblasts and questionable myeloid dysgranulopoiesis.  Cytogenetic studies could not be completed due to no metaphase cells available for analysis.  [facility] profile was also not completed due to limited specimen and poor quality DNA in the specimen.  Peripheral blood intelligen myeloid testing showed SF 3 B1 mutation and CAL R mutation   Luspatercept  was started in June 2025  Interval history-he reports that he tripped over a parking block this morning but did not have any significant injuries.  He is independent of his ADLs.  He has not required a blood transfusion in over a month now.  ECOG PS- 2 Pain scale- 0   Review of systems- Review of Systems  Constitutional:  Negative for chills, fever, malaise/fatigue and weight loss.  HENT:  Negative for congestion, ear discharge and nosebleeds.   Eyes:  Negative for blurred vision.  Respiratory:  Negative for cough, hemoptysis, sputum production, shortness of breath and wheezing.   Cardiovascular:  Negative for chest pain, palpitations, orthopnea and claudication.  Gastrointestinal:  Negative for abdominal pain, blood in stool, constipation, diarrhea, heartburn, melena, nausea and vomiting.  Genitourinary:  Negative for dysuria, flank pain, frequency, hematuria and urgency.  Musculoskeletal:  Negative for back pain, joint pain and myalgias.  Skin:  Negative for rash.  Neurological:  Negative for dizziness, tingling, focal weakness, seizures, weakness and headaches.  Endo/Heme/Allergies:  Does not bruise/bleed easily.  Psychiatric/Behavioral:  Negative for  depression and suicidal ideas. The patient does not have insomnia.       Allergies  Allergen Reactions   Quinolones Other (See Comments)    Patient with aortic aneurysm. Use of quinolones are considered to be contraindicated due to risk of aortic rupture.   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   right forearm, [facility]   Stroke (HCC) 11/2007   R brain subcortical infarct     Past Surgical History:  Procedure Laterality Date   ASPIRATION / INJECTION RENAL CYST  07/08/2015   BACK SURGERY     approx 20- 25 years ago   COLONOSCOPY     EYE SURGERY     cataract both eyes   GAS INSERTION  08/11/2011   Procedure: INSERTION OF GAS;  Surgeon: Norleen JONETTA Ku, MD;  Location: [facility] OR;  Service: Ophthalmology;  Laterality: Right;  C3F8   IR BONE MARROW BIOPSY & ASPIRATION  08/03/2023   IR FLUORO GUIDE CV LINE RIGHT  10/23/2023   IR REMOVAL TUN CV CATH W/O FL  11/07/2023   IR THORACENTESIS ASP PLEURAL SPACE W/IMG GUIDE  09/22/2023   IR THORACENTESIS ASP PLEURAL SPACE W/IMG GUIDE  10/02/2023   LEFT HEART CATH AND CORONARY ANGIOGRAPHY N/A 05/02/2023   Procedure: LEFT HEART CATH AND CORONARY ANGIOGRAPHY;  Surgeon: Mady Bruckner, MD;  Location: [facility] INVASIVE CV LAB;  Service: Cardiovascular;  Laterality: N/A;   REVERSE SHOULDER ARTHROPLASTY Right 04/13/2023   Procedure: REVERSE SHOULDER ARTHROPLASTY;  Surgeon: Edie Norleen PARAS, MD;  Location: [facility]  ORS;  Service: Orthopedics;  Laterality: Right;   SCLERAL BUCKLE  08/11/2011   Procedure: SCLERAL BUCKLE;  Surgeon: Norleen JONETTA Ku, MD;  Location: [facility] OR;  Service: Ophthalmology;  Laterality: Right;   VARICOSE VEIN SURGERY      Social History   Socioeconomic History   Marital status: Married    Spouse name: Maceo    Number of children: 2   Years of education: 12+   Highest education level: Some college, no degree  Occupational History   Occupation: Fish farm manager: OTHER    Comment: community   Occupation: SELF EMPLOYED    Employer: SELF EMPLOYED  Tobacco Use   Smoking status: Former    Current packs/day: 0.00    Types: Cigarettes    Quit date: 06/06/2010    Years since quitting: 13.7   Smokeless tobacco: Never  Vaping Use   Vaping status: Never Used  Substance and Sexual Activity   Alcohol use: No    Alcohol/week: 0.0 standard drinks of alcohol   Drug use: No   Sexual activity: Not Currently  Other Topics Concern   Not on file  Social History Narrative   Pt lives at home with his family.   Caffeine Use- 2 cups daily   Patient  has 2 children.    Patient has some college.    Patient is right handed.          Works full time   Social Drivers of Corporate investment banker Strain: Low Risk  (07/12/2021)   Overall Financial Resource Strain (CARDIA)    Difficulty of Paying Living Expenses: Not hard at all  Food Insecurity: No Food Insecurity (10/16/2023)   Hunger Vital Sign    Worried About Running Out of Food in the Last Year: Never true    Ran Out of Food in the Last Year: Never true  Transportation Needs: No Transportation Needs (10/16/2023)   PRAPARE - Administrator, Civil Service (Medical): No    Lack of Transportation (Non-Medical): No  Physical Activity: Inactive (07/12/2021)   Exercise Vital Sign    Days of Exercise per Week: 0 days    Minutes of Exercise per Session: 0 min  Stress: No Stress Concern Present (07/12/2021)   [employer] of  Occupational Health - Occupational Stress Questionnaire    Feeling of Stress : Not at all  Social Connections: Socially Integrated (10/16/2023)   Social Connection and Isolation Panel    Frequency of Communication with Friends and Family: More than three times a week    Frequency of Social Gatherings with Friends and Family: More than three times a week    Attends Religious Services: More than 4 times per year    Active Member of [employer] or Organizations: Yes    Attends Engineer, structural: More than 4 times per year    Marital Status: Married  Catering manager Violence: Not At Risk (10/16/2023)   Humiliation, Afraid, Rape, and Kick questionnaire    Fear of Current or Ex-Partner: No    Emotionally Abused: No    Physically Abused: No    Sexually Abused: No    Family History  Problem Relation Age of Onset   Heart disease Father    Dementia Sister    Diabetes Son    Kidney disease Neg Hx    Prostate cancer Neg Hx      Current Outpatient Medications:    acetaminophen  (TYLENOL ) 500 MG tablet, Take 1,000 mg by mouth every 6 (six) hours as needed for mild pain (pain score 1-3)., Disp: , Rfl:    amiodarone  (PACERONE ) 200 MG tablet, Take 1 tablet (200 mg total) by mouth daily., Disp: 30 tablet, Rfl: 0   cyanocobalamin  1000 MCG tablet, Take 1 tablet (1,000 mcg total) by mouth daily., Disp: 30 tablet, Rfl: 0   Dextromethorphan -guaiFENesin  20-200 MG/20ML LIQD, Take 10 mLs by mouth every 4 (four) hours as needed., Disp: 118 mL, Rfl: 0   DULoxetine  (CYMBALTA ) 20 MG capsule, Take 1 capsule (20 mg total) by mouth at bedtime., Disp: 30 capsule, Rfl: 0   furosemide  (LASIX ) 40 MG tablet, TAKE 1 TABLET BY MOUTH DAILY, Disp: 90 tablet, Rfl: 0   latanoprost  (XALATAN ) 0.005 % ophthalmic solution, , Disp: , Rfl:    levothyroxine  (SYNTHROID ) 150 MCG tablet, Take 1 tablet (150 mcg total) by mouth daily before breakfast., Disp: 30 tablet, Rfl: 1   lidocaine  (LIDODERM ) 5 %, Place 1 patch onto the skin  daily. Remove & Discard patch within 12 hours or as directed by MD, Disp: 30 patch, Rfl: 0   metoprolol  tartrate (LOPRESSOR ) 50 MG tablet, Take 0.5 tablets (25 mg total) by mouth 2 (two) times daily., Disp: , Rfl:    Multiple Vitamin (MULITIVITAMIN WITH MINERALS) TABS,  Take 1 tablet by mouth in the morning., Disp: , Rfl:    ondansetron  (ZOFRAN ) 4 MG tablet, Take 1 tablet (4 mg total) by mouth every 6 (six) hours as needed for nausea., Disp: 20 tablet, Rfl: 0   pantoprazole  (PROTONIX ) 40 MG tablet, Take 1 tablet (40 mg total) by mouth daily., Disp: 30 tablet, Rfl: 0   QUEtiapine  (SEROQUEL ) 25 MG tablet, Take 1 tablet (25 mg total) by mouth at bedtime as needed (sleep, restlessness)., Disp: 10 tablet, Rfl: 0   simvastatin  (ZOCOR ) 40 MG tablet, Take 1 tablet (40 mg total) by mouth at bedtime., Disp: 30 tablet, Rfl: 0   sodium bicarbonate  650 MG tablet, Take 1 tablet (650 mg total) by mouth 3 (three) times daily., Disp: , Rfl:    tamsulosin  (FLOMAX ) 0.4 MG CAPS capsule, Take 0.4 mg by mouth., Disp: , Rfl:    traMADol  (ULTRAM ) 50 MG tablet, Take 1 tablet (50 mg total) by mouth every 12 (twelve) hours as needed for moderate pain (pain score 4-6)., Disp: 30 tablet, Rfl: 0   [Paused] lenalidomide  (REVLIMID ) 5 MG capsule, Take 1 capsule (5 mg total) by mouth daily. Take for 21 days, then hold for 7 days. Repeat every 28 days. (Patient not taking: Reported on 11/22/2023), Disp: 21 capsule, Rfl: 0   metoprolol  succinate (TOPROL -XL) 25 MG 24 hr tablet, Take 25 mg by mouth. (Patient not taking: Reported on 01/24/2024), Disp: , Rfl:  No current facility-administered medications for this visit.  Facility-Administered Medications Ordered in Other Visits:    epoetin  alfa-epbx (RETACRIT ) injection 10,000 Units, 10,000 Units, Subcutaneous, Once, Melanee Annah BROCKS, MD   epoetin  alfa-epbx (RETACRIT ) injection 40,000 Units, 40,000 Units, Subcutaneous, Once, Melanee Annah BROCKS, MD  Physical exam:  Vitals:   02/14/24 1025  BP:  134/83  Pulse: 69  Resp: 19  Temp: (!) 96.8 F (36 C)  TempSrc: Tympanic  SpO2: 98%  Weight: 151 lb 11.2 oz (68.8 kg)  Height: 5' 11 (1.803 m)   Physical Exam Constitutional:      Comments: Sitting in a wheelchair. Appears in no acute distress  Cardiovascular:     Rate and Rhythm: Normal rate and regular rhythm.     Heart sounds: Normal heart sounds.  Pulmonary:     Effort: Pulmonary effort is normal.     Breath sounds: Normal breath sounds.  Skin:    General: Skin is warm and dry.  Neurological:     Mental Status: He is alert and oriented to person, place, and time.      I have personally reviewed labs listed below:    Latest Ref Rng & Units 02/14/2024   10:00 AM  CMP  Glucose 70 - 99 mg/dL 95   BUN 8 - 23 mg/dL 55   Creatinine 9.38 - 1.24 mg/dL 6.42   Sodium 864 - 854 mmol/L 136   Potassium 3.5 - 5.1 mmol/L 5.2   Chloride 98 - 111 mmol/L 104   CO2 22 - 32 mmol/L 21   Calcium  8.9 - 10.3 mg/dL 8.1   Total Protein 6.5 - 8.1 g/dL 6.2   Total Bilirubin 0.0 - 1.2 mg/dL 0.6   Alkaline Phos 38 - 126 U/L 113   AST 15 - 41 U/L 20   ALT 0 - 44 U/L 17       Latest Ref Rng & Units 02/14/2024   10:00 AM  CBC  WBC 4.0 - 10.5 K/uL 6.5   Hemoglobin 13.0 - 17.0 g/dL 7.8  Hematocrit 39.0 - 52.0 % 25.1   Platelets 150 - 400 K/uL 241     Assessment and plan- Patient is a 83 y.o. male  with history of primary myelofibrosis/MDS with SF3B1 mutation currently on luspatercept  here for routine f/u  Patient is currently on luspatercept  1.33 mg/kg.  I am increasing it to 1.75 mg/kg which would be the maximum dose and he will continue to get it every 3 weeks.  Hemoglobin still remains around 7-8 but he has remained transfusion free since June 2025.  He also has CKD which is contributing to his anemia.  He was receiving EPO in the past but that did not improve his anemia.  Will continue with luspatercept  until progression or toxicity.  I have encouraged him to continue his follow-up with  nephrology as well.  Luspatercept  today and continue that with H&H every 3 weeks.  See me in 12 weeks with CBC ferritin and iron studies and CMP.  Hold tube for possible transfusion every 3 weeks   Visit Diagnosis 1. Myelodysplasia (myelodysplastic syndrome) (HCC)   2. Encounter for antineoplastic chemotherapy      Dr. Annah Skene, MD, MPH [facility] at [facility] [phone] 02/14/2024 1:02 PM

## 2024-02-15 ENCOUNTER — Ambulatory Visit: Admitting: Family Medicine

## 2024-02-19 ENCOUNTER — Ambulatory Visit (INDEPENDENT_AMBULATORY_CARE_PROVIDER_SITE_OTHER)

## 2024-02-19 DIAGNOSIS — Z23 Encounter for immunization: Secondary | ICD-10-CM | POA: Diagnosis not present

## 2024-02-19 NOTE — Progress Notes (Signed)
 Patient is in office today for a nurse visit for Flu Vaccine. Patient Injection was given in the  Left deltoid. Patient tolerated injection well.

## 2024-02-21 ENCOUNTER — Telehealth: Payer: Self-pay | Admitting: *Deleted

## 2024-02-21 ENCOUNTER — Inpatient Hospital Stay

## 2024-02-21 DIAGNOSIS — D469 Myelodysplastic syndrome, unspecified: Secondary | ICD-10-CM | POA: Diagnosis not present

## 2024-02-21 LAB — CMP (CANCER CENTER ONLY)
ALT: 22 U/L (ref 0–44)
AST: 26 U/L (ref 15–41)
Albumin: 3.4 g/dL — ABNORMAL LOW (ref 3.5–5.0)
Alkaline Phosphatase: 116 U/L (ref 38–126)
Anion gap: 9 (ref 5–15)
BUN: 72 mg/dL — ABNORMAL HIGH (ref 8–23)
CO2: 21 mmol/L — ABNORMAL LOW (ref 22–32)
Calcium: 8.2 mg/dL — ABNORMAL LOW (ref 8.9–10.3)
Chloride: 105 mmol/L (ref 98–111)
Creatinine: 4.56 mg/dL — ABNORMAL HIGH (ref 0.61–1.24)
GFR, Estimated: 12 mL/min — ABNORMAL LOW (ref >60)
Glucose, Bld: 117 mg/dL — ABNORMAL HIGH (ref 70–99)
Potassium: 5 mmol/L (ref 3.5–5.1)
Sodium: 135 mmol/L (ref 135–145)
Total Bilirubin: 1 mg/dL (ref 0.0–1.2)
Total Protein: 6.4 g/dL — ABNORMAL LOW (ref 6.5–8.1)

## 2024-02-21 LAB — SAMPLE TO BLOOD BANK

## 2024-02-21 LAB — HEMOGLOBIN AND HEMATOCRIT (CANCER CENTER ONLY)
HCT: 26.4 % — ABNORMAL LOW (ref 39.0–52.0)
Hemoglobin: 8.3 g/dL — ABNORMAL LOW (ref 13.0–17.0)

## 2024-02-21 NOTE — Telephone Encounter (Signed)
 Called and left message for patient that he did not need a blood transfusion tomorrow per  Dr Melanee.

## 2024-02-22 ENCOUNTER — Inpatient Hospital Stay

## 2024-02-28 ENCOUNTER — Ambulatory Visit: Admitting: Family Medicine

## 2024-02-29 NOTE — Telephone Encounter (Unsigned)
 Copied from CRM #[id number]. Topic: General - Other >> Feb 29, 2024  4:25 PM Thersia BROCKS wrote: Reason for CRM: : Patient wife called in regarding patient missing appointment, wanted to know if she is able to get a soonest appointment regarding his gabapentin 

## 2024-03-06 ENCOUNTER — Inpatient Hospital Stay (HOSPITAL_BASED_OUTPATIENT_CLINIC_OR_DEPARTMENT_OTHER): Admitting: Oncology

## 2024-03-06 ENCOUNTER — Encounter: Payer: Self-pay | Admitting: Oncology

## 2024-03-06 ENCOUNTER — Ambulatory Visit: Payer: Self-pay | Admitting: Oncology

## 2024-03-06 ENCOUNTER — Inpatient Hospital Stay

## 2024-03-06 ENCOUNTER — Inpatient Hospital Stay: Attending: Oncology

## 2024-03-06 VITALS — BP 142/66 | HR 62 | Temp 98.0°F | Resp 20 | Ht 71.0 in | Wt 162.5 lb

## 2024-03-06 DIAGNOSIS — N184 Chronic kidney disease, stage 4 (severe): Secondary | ICD-10-CM

## 2024-03-06 DIAGNOSIS — M9901 Segmental and somatic dysfunction of cervical region: Secondary | ICD-10-CM | POA: Diagnosis not present

## 2024-03-06 DIAGNOSIS — D469 Myelodysplastic syndrome, unspecified: Secondary | ICD-10-CM | POA: Insufficient documentation

## 2024-03-06 DIAGNOSIS — M5033 Other cervical disc degeneration, cervicothoracic region: Secondary | ICD-10-CM | POA: Diagnosis not present

## 2024-03-06 DIAGNOSIS — M9903 Segmental and somatic dysfunction of lumbar region: Secondary | ICD-10-CM | POA: Diagnosis not present

## 2024-03-06 DIAGNOSIS — Z79899 Other long term (current) drug therapy: Secondary | ICD-10-CM | POA: Diagnosis not present

## 2024-03-06 DIAGNOSIS — M5412 Radiculopathy, cervical region: Secondary | ICD-10-CM | POA: Diagnosis not present

## 2024-03-06 LAB — BASIC METABOLIC PANEL - CANCER CENTER ONLY
Anion gap: 7 (ref 5–15)
BUN: 62 mg/dL — ABNORMAL HIGH (ref 8–23)
CO2: 24 mmol/L (ref 22–32)
Calcium: 8.2 mg/dL — ABNORMAL LOW (ref 8.9–10.3)
Chloride: 101 mmol/L (ref 98–111)
Creatinine: 4.78 mg/dL — ABNORMAL HIGH (ref 0.61–1.24)
GFR, Estimated: 11 mL/min — ABNORMAL LOW (ref >60)
Glucose, Bld: 98 mg/dL (ref 70–99)
Potassium: 5.7 mmol/L — ABNORMAL HIGH (ref 3.5–5.1)
Sodium: 132 mmol/L — ABNORMAL LOW (ref 135–145)

## 2024-03-06 LAB — CBC WITH DIFFERENTIAL (CANCER CENTER ONLY)
Abs Immature Granulocytes: 0.57 K/uL — ABNORMAL HIGH (ref 0.00–0.07)
Basophils Absolute: 0 K/uL (ref 0.0–0.1)
Basophils Relative: 1 %
Eosinophils Absolute: 0 K/uL (ref 0.0–0.5)
Eosinophils Relative: 0 %
HCT: 26.8 % — ABNORMAL LOW (ref 39.0–52.0)
Hemoglobin: 8.4 g/dL — ABNORMAL LOW (ref 13.0–17.0)
Immature Granulocytes: 7 %
Lymphocytes Relative: 16 %
Lymphs Abs: 1.4 K/uL (ref 0.7–4.0)
MCH: 29.7 pg (ref 26.0–34.0)
MCHC: 31.3 g/dL (ref 30.0–36.0)
MCV: 94.7 fL (ref 80.0–100.0)
Monocytes Absolute: 1 K/uL (ref 0.1–1.0)
Monocytes Relative: 12 %
Neutro Abs: 5.4 K/uL (ref 1.7–7.7)
Neutrophils Relative %: 64 %
Platelet Count: 252 K/uL (ref 150–400)
RBC: 2.83 MIL/uL — ABNORMAL LOW (ref 4.22–5.81)
RDW: 18.4 % — ABNORMAL HIGH (ref 11.5–15.5)
Smear Review: NORMAL
WBC Count: 8.4 K/uL (ref 4.0–10.5)
nRBC: 1.6 % — ABNORMAL HIGH (ref 0.0–0.2)

## 2024-03-06 LAB — SAMPLE TO BLOOD BANK

## 2024-03-06 MED ORDER — LUSPATERCEPT-AAMT 75 MG ~~LOC~~ SOLR
125.0000 mg | Freq: Once | SUBCUTANEOUS | Status: AC
  Administered 2024-03-06: 125 mg via SUBCUTANEOUS
  Filled 2024-03-06: qty 1

## 2024-03-06 NOTE — Progress Notes (Signed)
 Hematology/Oncology Consult note [facility]  [phone] [phone]  Patient Care Team: Vicci Duwaine SQUIBB, DO as PCP - General (Family Medicine) Perla Evalene PARAS, MD as PCP - Cardiology (Cardiology) Etha Quinten Ludwig, MD as Referring Physician (Hematology) Alvia Norleen BIRCH, MD as Consulting Physician (Ophthalmology) Rosemarie Eather RAMAN, MD as Consulting Physician (Neurology) Nieves Cough, MD as Consulting Physician (Urology) Perla Evalene PARAS, MD as Consulting Physician (Cardiology) Twylla Glendia BROCKS, MD (Urology) Melanee Annah BROCKS, MD as Consulting Physician (Oncology) Willma Camelia CROME, RN (Inactive) as [facility] Care Management   Name of the patient: [name]  [id number]  [birth year]   Date of visit: 03/06/24  Diagnosis-MDS/MPN  Chief complaint/ Reason for visit-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  At that time he was found to have a mild splenomegaly of 15.8 cm.DIPPS score is 36 (age 32- 1, hemoglobin less than 10- 2) and score of 4 if 1% circulating blasts included from 07/17/2014.   Bone marrow on 06/11/2014 was most consistent with primary myelofibrosis.  Bone marrow biopsy showed 1% abnormal cells: CD45+, CD5+, CD10, CD11c+/-, CD19+, CD2-+, (dim), CD22+ (dim, CD23+, CD38-/+, FMC7-, HLA-DR+, sig lambda+(dim).  Blasts were not increased 1.2%; hypercellular for age: 49%; JAK2 V617F mutation was negative.  CALR mutation positive.  Flow cytometry included about 1% CLL/SLL phenotype cells (CD5+) of uncertain significance and some infiltrate into the marrow with increased atypical megakaryocytes.  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  MDS FISH panel was negative.   Patient also follows up with [facility] benign hematology Dr. Margart Ruddy for his anemia.   Patient's hemoglobin was drifting down to the eights and was started on EPO in October 2022.   Patient has had worsening anemia requiring intermittent blood transfusions despite receiving Retacrit  since October 2024.  PatientUnderwent a repeat bone marrow biopsy in February 2025.  Findings were consistent with myelofibrosis with evidence of possible dysplasia and increased ringed sideroblasts and questionable myeloid dysgranulopoiesis.  Cytogenetic studies could not be completed due to no metaphase cells available for analysis.  [employer] profile was also not completed due to limited specimen and poor quality DNA in the specimen.  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  No recent falls or hospitalizations.  ECOG PS- 2 Pain scale- 0  Review of systems- Review of Systems  Constitutional:  Positive for malaise/fatigue. Negative for chills, fever and weight loss.  HENT:  Negative for congestion, ear discharge and nosebleeds.   Eyes:  Negative for blurred vision.  Respiratory:  Negative for cough, hemoptysis, sputum production, shortness of breath and wheezing.   Cardiovascular:  Negative for chest pain, palpitations, orthopnea and claudication.  Gastrointestinal:  Negative for abdominal pain, blood in stool, constipation, diarrhea, heartburn, melena, nausea and vomiting.  Genitourinary:  Negative for dysuria, flank pain, frequency, hematuria and urgency.  Musculoskeletal:  Negative for back pain, joint pain and myalgias.       Muscle twitches  Skin:  Negative for rash.  Neurological:  Negative for dizziness, tingling, focal weakness, seizures, weakness and headaches.  Endo/Heme/Allergies:  Does not bruise/bleed easily.  Psychiatric/Behavioral:  Negative for depression and suicidal ideas. The patient does not  have  insomnia.       Allergies  Allergen Reactions   Quinolones Other (See Comments)    Patient with aortic aneurysm. Use of quinolones are considered to be contraindicated due to risk of aortic rupture.   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   right forearm, [facility]   Stroke (HCC) 11/2007   R brain subcortical infarct     Past Surgical History:  Procedure Laterality Date   ASPIRATION / INJECTION RENAL CYST  07/08/2015   BACK SURGERY     approx 20- 25 years ago   COLONOSCOPY     EYE SURGERY     cataract both eyes   GAS INSERTION  08/11/2011   Procedure: INSERTION OF GAS;  Surgeon: Norleen JONETTA Ku, MD;  Location: [facility] OR;  Service: Ophthalmology;  Laterality: Right;  C3F8   IR BONE MARROW BIOPSY & ASPIRATION  08/03/2023   IR FLUORO GUIDE CV LINE RIGHT  10/23/2023   IR REMOVAL TUN CV CATH W/O FL  11/07/2023   IR THORACENTESIS ASP PLEURAL SPACE W/IMG GUIDE  09/22/2023   IR THORACENTESIS ASP PLEURAL SPACE W/IMG GUIDE  10/02/2023   LEFT HEART CATH AND CORONARY ANGIOGRAPHY N/A 05/02/2023   Procedure: LEFT HEART CATH AND CORONARY ANGIOGRAPHY;  Surgeon: Mady Bruckner, MD;  Location: [facility] INVASIVE CV LAB;  Service: Cardiovascular;  Laterality: N/A;   REVERSE SHOULDER ARTHROPLASTY Right 04/13/2023   Procedure: REVERSE SHOULDER ARTHROPLASTY;  Surgeon: Edie Norleen PARAS, MD;  Location: [facility] ORS;  Service: Orthopedics;  Laterality: Right;   SCLERAL  BUCKLE  08/11/2011   Procedure: SCLERAL BUCKLE;  Surgeon: Norleen JONETTA Ku, MD;  Location: [facility] OR;  Service: Ophthalmology;  Laterality: Right;   VARICOSE VEIN SURGERY      Social History   Socioeconomic History   Marital status: Married    Spouse name: Maceo    Number of children: 2   Years of education: 12+   Highest education level: Some college, no degree  Occupational History   Occupation: Fish farm manager: OTHER    Comment: community   Occupation: SELF EMPLOYED    Employer: SELF EMPLOYED  Tobacco Use   Smoking status: Former    Current packs/day: 0.00    Types: Cigarettes    Quit date: 06/06/2010    Years since quitting: 13.7   Smokeless tobacco: Never  Vaping Use   Vaping status: Never Used  Substance and Sexual Activity   Alcohol use: No    Alcohol/week: 0.0 standard drinks of alcohol   Drug use: No   Sexual activity: Not Currently  Other Topics Concern   Not on file  Social History Narrative   Pt lives at home with his family.   Caffeine Use- 2 cups daily   Patient has 2 children.    Patient has some college.  Patient is right handed.          Works full time   Social Drivers of Corporate investment banker Strain: Low Risk  (07/12/2021)   Overall Financial Resource Strain (CARDIA)    Difficulty of Paying Living Expenses: Not hard at all  Food Insecurity: No Food Insecurity (10/16/2023)   Hunger Vital Sign    Worried About Running Out of Food in the Last Year: Never true    Ran Out of Food in the Last Year: Never true  Transportation Needs: No Transportation Needs (10/16/2023)   PRAPARE - Administrator, Civil Service (Medical): No    Lack of Transportation (Non-Medical): No  Physical Activity: Inactive (07/12/2021)   Exercise Vital Sign    Days of Exercise per Week: 0 days    Minutes of Exercise per Session: 0 min  Stress: No Stress Concern Present (07/12/2021)   [employer] of Occupational Health - Occupational Stress Questionnaire     Feeling of Stress : Not at all  Social Connections: Socially Integrated (10/16/2023)   Social Connection and Isolation Panel    Frequency of Communication with Friends and Family: More than three times a week    Frequency of Social Gatherings with Friends and Family: More than three times a week    Attends Religious Services: More than 4 times per year    Active Member of [employer] or Organizations: Yes    Attends Engineer, structural: More than 4 times per year    Marital Status: Married  Catering manager Violence: Not At Risk (10/16/2023)   Humiliation, Afraid, Rape, and Kick questionnaire    Fear of Current or Ex-Partner: No    Emotionally Abused: No    Physically Abused: No    Sexually Abused: No    Family History  Problem Relation Age of Onset   Heart disease Father    Dementia Sister    Diabetes Son    Kidney disease Neg Hx    Prostate cancer Neg Hx      Current Outpatient Medications:    acetaminophen  (TYLENOL ) 500 MG tablet, Take 1,000 mg by mouth every 6 (six) hours as needed for mild pain (pain score 1-3)., Disp: , Rfl:    amiodarone  (PACERONE ) 200 MG tablet, Take 1 tablet (200 mg total) by mouth daily., Disp: 30 tablet, Rfl: 0   cyanocobalamin  1000 MCG tablet, Take 1 tablet (1,000 mcg total) by mouth daily., Disp: 30 tablet, Rfl: 0   Dextromethorphan -guaiFENesin  20-200 MG/20ML LIQD, Take 10 mLs by mouth every 4 (four) hours as needed., Disp: 118 mL, Rfl: 0   DULoxetine  (CYMBALTA ) 20 MG capsule, Take 1 capsule (20 mg total) by mouth at bedtime., Disp: 30 capsule, Rfl: 0   furosemide  (LASIX ) 40 MG tablet, TAKE 1 TABLET BY MOUTH DAILY, Disp: 90 tablet, Rfl: 0   latanoprost  (XALATAN ) 0.005 % ophthalmic solution, , Disp: , Rfl:    levothyroxine  (SYNTHROID ) 150 MCG tablet, Take 1 tablet (150 mcg total) by mouth daily before breakfast., Disp: 30 tablet, Rfl: 1   metoprolol  tartrate (LOPRESSOR ) 50 MG tablet, Take 0.5 tablets (25 mg total) by mouth 2 (two) times daily., Disp:  , Rfl:    Multiple Vitamin (MULITIVITAMIN WITH MINERALS) TABS, Take 1 tablet by mouth in the morning., Disp: , Rfl:    ondansetron  (ZOFRAN ) 4 MG tablet, Take 1 tablet (4 mg total) by mouth every 6 (six) hours as needed for nausea., Disp: 20 tablet, Rfl: 0   pantoprazole  (  PROTONIX ) 40 MG tablet, Take 1 tablet (40 mg total) by mouth daily., Disp: 30 tablet, Rfl: 0   QUEtiapine  (SEROQUEL ) 25 MG tablet, Take 1 tablet (25 mg total) by mouth at bedtime as needed (sleep, restlessness)., Disp: 10 tablet, Rfl: 0   simvastatin  (ZOCOR ) 40 MG tablet, Take 1 tablet (40 mg total) by mouth at bedtime., Disp: 30 tablet, Rfl: 0   sodium bicarbonate  650 MG tablet, Take 1 tablet (650 mg total) by mouth 3 (three) times daily., Disp: , Rfl:    tamsulosin  (FLOMAX ) 0.4 MG CAPS capsule, Take 0.4 mg by mouth., Disp: , Rfl:    traMADol  (ULTRAM ) 50 MG tablet, Take 1 tablet (50 mg total) by mouth every 12 (twelve) hours as needed for moderate pain (pain score 4-6)., Disp: 30 tablet, Rfl: 0   [Paused] lenalidomide  (REVLIMID ) 5 MG capsule, Take 1 capsule (5 mg total) by mouth daily. Take for 21 days, then hold for 7 days. Repeat every 28 days. (Patient not taking: Reported on 11/22/2023), Disp: 21 capsule, Rfl: 0   lidocaine  (LIDODERM ) 5 %, Place 1 patch onto the skin daily. Remove & Discard patch within 12 hours or as directed by MD, Disp: 30 patch, Rfl: 0   metoprolol  succinate (TOPROL -XL) 25 MG 24 hr tablet, Take 25 mg by mouth. (Patient not taking: Reported on 01/24/2024), Disp: , Rfl:  No current facility-administered medications for this visit.  Facility-Administered Medications Ordered in Other Visits:    epoetin  alfa-epbx (RETACRIT ) injection 10,000 Units, 10,000 Units, Subcutaneous, Once, Melanee Annah BROCKS, MD   epoetin  alfa-epbx (RETACRIT ) injection 40,000 Units, 40,000 Units, Subcutaneous, Once, Melanee Annah BROCKS, MD  Physical exam:  Vitals:   03/06/24 1014 03/06/24 1015  BP: (!) 145/50 (!) 142/66  Pulse: 62   Resp: 20    Temp: 98 F (36.7 C)   TempSrc: Tympanic   SpO2: 99%   Weight: 162 lb 8 oz (73.7 kg)   Height: 5' 11 (1.803 m)    Physical Exam Constitutional:      Comments: Sitting in a wheelchair.  Appears in no acute distress  Cardiovascular:     Rate and Rhythm: Normal rate and regular rhythm.     Heart sounds: Normal heart sounds.  Pulmonary:     Effort: Pulmonary effort is normal.     Breath sounds: Normal breath sounds.  Abdominal:     General: Bowel sounds are normal.     Palpations: Abdomen is soft.  Skin:    General: Skin is warm and dry.  Neurological:     Mental Status: He is alert and oriented to person, place, and time.      I have personally reviewed labs listed below:    Latest Ref Rng & Units 02/21/2024    9:59 AM  CMP  Glucose 70 - 99 mg/dL 882   BUN 8 - 23 mg/dL 72   Creatinine 9.38 - 1.24 mg/dL 5.43   Sodium 864 - 854 mmol/L 135   Potassium 3.5 - 5.1 mmol/L 5.0   Chloride 98 - 111 mmol/L 105   CO2 22 - 32 mmol/L 21   Calcium  8.9 - 10.3 mg/dL 8.2   Total Protein 6.5 - 8.1 g/dL 6.4   Total Bilirubin 0.0 - 1.2 mg/dL 1.0   Alkaline Phos 38 - 126 U/L 116   AST 15 - 41 U/L 26   ALT 0 - 44 U/L 22       Latest Ref Rng & Units 03/06/2024    9:51  AM  CBC  WBC 4.0 - 10.5 K/uL 8.4   Hemoglobin 13.0 - 17.0 g/dL 8.4   Hematocrit 60.9 - 52.0 % 26.8   Platelets 150 - 400 K/uL 252      Assessment and plan- Patient is a 83 y.o. 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  He is at a maximum dose of luspatercept  presently at 1.75 mg/kg.  Hemoglobin today is 8.4 and he will not require a blood transfusion at this time.  Luspatercept  today.  H&H and luspatercept  every 3 weeks and see me in 3 months with CBC with differential CMP for luspatercept .  Hold tube for possible transfusion in 6 weeks and 12  weeks  Muscle twitches: I am concerned this is secondary to his CKD.  2 weeks ago his serum creatinine was getting worse and I will repeat a BMP today and reach out to nephrology.   Visit Diagnosis 1. High risk medication use   2. Myelodysplasia (myelodysplastic syndrome) (HCC)      Dr. Annah Skene, MD, MPH [facility] at [facility] [phone] 03/06/2024 12:28 PM

## 2024-03-06 NOTE — Progress Notes (Signed)
 Patient states he is starting jerk. The jerking comes and goes but makes in uncomfortable at times. He would like insight regarding why this is happening and if there is any medication that will help.

## 2024-03-06 NOTE — Progress Notes (Signed)
 This has been completed and communicated to both Dr. Melanee and Dr. Dennise via secure chat.

## 2024-03-06 NOTE — Progress Notes (Signed)
 BMP labs faxed to Dr. Aureliano office per Dr. Darold request; confirmation received.  Per Dr. Dennise we will be giving them veltassa  samples and check on him early nezxt week.

## 2024-03-07 ENCOUNTER — Inpatient Hospital Stay

## 2024-03-08 ENCOUNTER — Other Ambulatory Visit: Payer: Self-pay

## 2024-03-11 DIAGNOSIS — H35373 Puckering of macula, bilateral: Secondary | ICD-10-CM | POA: Diagnosis not present

## 2024-03-11 DIAGNOSIS — Z961 Presence of intraocular lens: Secondary | ICD-10-CM | POA: Diagnosis not present

## 2024-03-14 DIAGNOSIS — I129 Hypertensive chronic kidney disease with stage 1 through stage 4 chronic kidney disease, or unspecified chronic kidney disease: Secondary | ICD-10-CM | POA: Diagnosis not present

## 2024-03-14 DIAGNOSIS — N189 Chronic kidney disease, unspecified: Secondary | ICD-10-CM | POA: Diagnosis not present

## 2024-03-14 DIAGNOSIS — E875 Hyperkalemia: Secondary | ICD-10-CM | POA: Diagnosis not present

## 2024-03-14 DIAGNOSIS — N185 Chronic kidney disease, stage 5: Secondary | ICD-10-CM | POA: Diagnosis not present

## 2024-03-14 DIAGNOSIS — D631 Anemia in chronic kidney disease: Secondary | ICD-10-CM | POA: Diagnosis not present

## 2024-03-14 DIAGNOSIS — N2581 Secondary hyperparathyroidism of renal origin: Secondary | ICD-10-CM | POA: Diagnosis not present

## 2024-03-14 DIAGNOSIS — R809 Proteinuria, unspecified: Secondary | ICD-10-CM | POA: Diagnosis not present

## 2024-03-21 ENCOUNTER — Telehealth: Payer: Self-pay

## 2024-03-21 MED ORDER — LEVOTHYROXINE SODIUM 150 MCG PO TABS: 150.0000 ug | ORAL_TABLET | Freq: Every day | ORAL | 0 refills | Status: DC

## 2024-03-21 NOTE — Telephone Encounter (Signed)
 Pharmacy states that the patient is no longer under hospice care and is requesting refills.

## 2024-03-21 NOTE — Telephone Encounter (Signed)
 Amniodarone needs to come from cardiology. We don't write that one. He needs a follow up for his thyroid . Has no-showed several appointments.

## 2024-03-23 ENCOUNTER — Other Ambulatory Visit: Payer: Self-pay | Admitting: Family Medicine

## 2024-03-26 ENCOUNTER — Encounter: Payer: Self-pay | Admitting: Oncology

## 2024-03-26 NOTE — Telephone Encounter (Signed)
 Requested medications are due for refill today.  yes  Requested medications are on the active medications list.  yes  Last refill. 10/09/2023   Future visit scheduled.   yes  Notes to clinic.  Rx signed by Toribio Pitch    Requested Prescriptions  Pending Prescriptions Disp Refills   simvastatin  (ZOCOR ) 40 MG tablet [Pharmacy Med Name: SIMVASTATIN  40 MG TAB] 90 tablet     Sig: TAKE ONE TABLET BY MOUTH ONCE DAILY     Cardiovascular:  Antilipid - Statins Failed - 03/26/2024 10:52 AM      Failed - Lipid Panel in normal range within the last 12 months    Cholesterol, Total  Date Value Ref Range Status  09/13/2022 102 100 - 199 mg/dL Final   Cholesterol  Date Value Ref Range Status  05/02/2023 84 0 - 200 mg/dL Final   Cholesterol Piccolo, Waived  Date Value Ref Range Status  04/14/2016 106 <200 mg/dL Final    Comment:                            Desirable                <200                         Borderline High      200- 239                         High                     >239    LDL Chol Calc (NIH)  Date Value Ref Range Status  09/13/2022 49 0 - 99 mg/dL Final   LDL Cholesterol  Date Value Ref Range Status  05/02/2023 47 0 - 99 mg/dL Final    Comment:           Total Cholesterol/HDL:CHD Risk Coronary Heart Disease Risk Table                     Men   Women  1/2 Average Risk   3.4   3.3  Average Risk       5.0   4.4  2 X Average Risk   9.6   7.1  3 X Average Risk  23.4   11.0        Use the calculated Patient Ratio above and the CHD Risk Table to determine the patient's CHD Risk.        ATP III CLASSIFICATION (LDL):  <100     mg/dL   Optimal  899-870  mg/dL   Near or Above                    Optimal  130-159  mg/dL   Borderline  839-810  mg/dL   High  >809     mg/dL   Very High Performed at [facility], [address] [place], [place] [address]    HDL  Date Value Ref Range Status  05/02/2023 23 (L) >40 mg/dL Final  95/90/7975 37 (L) >39  mg/dL Final   Triglycerides  Date Value Ref Range Status  05/02/2023 68 <150 mg/dL Final   Triglycerides Piccolo,Waived  Date Value Ref Range Status  04/14/2016 99 <150 mg/dL Final    Comment:  Normal                   <150                         Borderline High     150 - 199                         High                200 - 499                         Very High                >499          Passed - Patient is not pregnant      Passed - Valid encounter within last 12 months    Recent Outpatient Visits           3 months ago Pleural effusion   [place] [facility] [place], Megan P, DO   7 months ago Benign hypertensive renal disease   [place] [facility] [place], Megan P, DO   7 months ago Sacral pain   [place] [facility] [place], [place], DO

## 2024-03-27 ENCOUNTER — Inpatient Hospital Stay

## 2024-03-27 ENCOUNTER — Ambulatory Visit: Admitting: Oncology

## 2024-03-27 ENCOUNTER — Encounter: Payer: Self-pay | Admitting: Oncology

## 2024-03-27 VITALS — BP 163/88 | HR 73 | Wt 167.1 lb

## 2024-03-27 DIAGNOSIS — D469 Myelodysplastic syndrome, unspecified: Secondary | ICD-10-CM

## 2024-03-27 DIAGNOSIS — M9903 Segmental and somatic dysfunction of lumbar region: Secondary | ICD-10-CM | POA: Diagnosis not present

## 2024-03-27 DIAGNOSIS — M9901 Segmental and somatic dysfunction of cervical region: Secondary | ICD-10-CM | POA: Diagnosis not present

## 2024-03-27 DIAGNOSIS — M5412 Radiculopathy, cervical region: Secondary | ICD-10-CM | POA: Diagnosis not present

## 2024-03-27 DIAGNOSIS — M5033 Other cervical disc degeneration, cervicothoracic region: Secondary | ICD-10-CM | POA: Diagnosis not present

## 2024-03-27 LAB — CBC WITH DIFFERENTIAL (CANCER CENTER ONLY)
Abs Immature Granulocytes: 1.03 K/uL — ABNORMAL HIGH (ref 0.00–0.07)
Basophils Absolute: 0 K/uL (ref 0.0–0.1)
Basophils Relative: 1 %
Eosinophils Absolute: 0 K/uL (ref 0.0–0.5)
Eosinophils Relative: 1 %
HCT: 25.2 % — ABNORMAL LOW (ref 39.0–52.0)
Hemoglobin: 8 g/dL — ABNORMAL LOW (ref 13.0–17.0)
Immature Granulocytes: 12 %
Lymphocytes Relative: 18 %
Lymphs Abs: 1.5 K/uL (ref 0.7–4.0)
MCH: 30.1 pg (ref 26.0–34.0)
MCHC: 31.7 g/dL (ref 30.0–36.0)
MCV: 94.7 fL (ref 80.0–100.0)
Monocytes Absolute: 1 K/uL (ref 0.1–1.0)
Monocytes Relative: 11 %
Neutro Abs: 4.9 K/uL (ref 1.7–7.7)
Neutrophils Relative %: 57 %
Platelet Count: 203 K/uL (ref 150–400)
RBC: 2.66 MIL/uL — ABNORMAL LOW (ref 4.22–5.81)
RDW: 19 % — ABNORMAL HIGH (ref 11.5–15.5)
WBC Count: 8.5 K/uL (ref 4.0–10.5)
nRBC: 1.8 % — ABNORMAL HIGH (ref 0.0–0.2)

## 2024-03-27 LAB — SAMPLE TO BLOOD BANK

## 2024-03-27 MED ORDER — LUSPATERCEPT-AAMT 75 MG ~~LOC~~ SOLR
125.0000 mg | Freq: Once | SUBCUTANEOUS | Status: AC
  Administered 2024-03-27: 125 mg via SUBCUTANEOUS
  Filled 2024-03-27: qty 1

## 2024-03-29 ENCOUNTER — Encounter

## 2024-04-05 ENCOUNTER — Encounter: Payer: Self-pay | Admitting: Oncology

## 2024-04-05 NOTE — Telephone Encounter (Signed)
 Encounter opened in error.

## 2024-04-10 ENCOUNTER — Ambulatory Visit (INDEPENDENT_AMBULATORY_CARE_PROVIDER_SITE_OTHER): Admitting: Family Medicine

## 2024-04-10 VITALS — BP 128/73 | HR 78 | Temp 98.1°F | Ht 71.0 in | Wt 161.0 lb

## 2024-04-10 DIAGNOSIS — E039 Hypothyroidism, unspecified: Secondary | ICD-10-CM | POA: Diagnosis not present

## 2024-04-10 DIAGNOSIS — D61818 Other pancytopenia: Secondary | ICD-10-CM | POA: Diagnosis not present

## 2024-04-10 DIAGNOSIS — E782 Mixed hyperlipidemia: Secondary | ICD-10-CM

## 2024-04-10 DIAGNOSIS — R7989 Other specified abnormal findings of blood chemistry: Secondary | ICD-10-CM

## 2024-04-10 DIAGNOSIS — E559 Vitamin D deficiency, unspecified: Secondary | ICD-10-CM | POA: Diagnosis not present

## 2024-04-10 DIAGNOSIS — K219 Gastro-esophageal reflux disease without esophagitis: Secondary | ICD-10-CM | POA: Diagnosis not present

## 2024-04-10 DIAGNOSIS — Z23 Encounter for immunization: Secondary | ICD-10-CM

## 2024-04-10 DIAGNOSIS — N184 Chronic kidney disease, stage 4 (severe): Secondary | ICD-10-CM | POA: Diagnosis not present

## 2024-04-10 DIAGNOSIS — N401 Enlarged prostate with lower urinary tract symptoms: Secondary | ICD-10-CM | POA: Diagnosis not present

## 2024-04-10 DIAGNOSIS — R21 Rash and other nonspecific skin eruption: Secondary | ICD-10-CM | POA: Diagnosis not present

## 2024-04-10 DIAGNOSIS — D7581 Myelofibrosis: Secondary | ICD-10-CM

## 2024-04-10 DIAGNOSIS — Z789 Other specified health status: Secondary | ICD-10-CM | POA: Diagnosis not present

## 2024-04-10 DIAGNOSIS — R739 Hyperglycemia, unspecified: Secondary | ICD-10-CM

## 2024-04-10 DIAGNOSIS — I129 Hypertensive chronic kidney disease with stage 1 through stage 4 chronic kidney disease, or unspecified chronic kidney disease: Secondary | ICD-10-CM | POA: Diagnosis not present

## 2024-04-10 DIAGNOSIS — R300 Dysuria: Secondary | ICD-10-CM

## 2024-04-10 LAB — URINALYSIS, ROUTINE W REFLEX MICROSCOPIC
Bilirubin, UA: NEGATIVE
Glucose, UA: NEGATIVE
Ketones, UA: NEGATIVE
Leukocytes,UA: NEGATIVE
Nitrite, UA: NEGATIVE
Specific Gravity, UA: 1.02 (ref 1.005–1.030)
Urobilinogen, Ur: 0.2 mg/dL (ref 0.2–1.0)
pH, UA: 6 (ref 5.0–7.5)

## 2024-04-10 LAB — MICROSCOPIC EXAMINATION
Bacteria, UA: NONE SEEN
WBC, UA: NONE SEEN /HPF (ref 0–5)

## 2024-04-10 LAB — MICROALBUMIN, URINE WAIVED
Creatinine, Urine Waived: 100 mg/dL (ref 10–300)
Microalb, Ur Waived: 150 mg/L — ABNORMAL HIGH (ref 0–19)
Microalb/Creat Ratio: >300 mg/g — ABNORMAL HIGH (ref <30)

## 2024-04-10 LAB — BAYER DCA HB A1C WAIVED: HB A1C (BAYER DCA - WAIVED): 5.3 % (ref 4.8–5.6)

## 2024-04-10 MED ORDER — METHYLPREDNISOLONE SODIUM SUCC 40 MG IJ SOLR
40.0000 mg | Freq: Once | INTRAMUSCULAR | Status: AC
  Administered 2024-04-10: 40 mg via INTRAMUSCULAR

## 2024-04-10 NOTE — Progress Notes (Signed)
 BP 128/73   Pulse 78   Temp 98.1 F (36.7 C) (Oral)   Ht 5' 11 (1.803 m)   Wt 161 lb (73 kg)   SpO2 97%   BMI 22.45 kg/m    Subjective:    Patient ID: [name], male    DOB: November 12, 1940, 83 y.o.   MRN: [id number]  HPI: [name] is a 83 y.o. male  Chief Complaint  Patient presents with   Rash    Some red itchy bumps are all over   Nausea    After eating breakfast for the last couple days   HYPOTHYROIDISM Thyroid  control status:uncontrolled Satisfied with current treatment? no Medication side effects: no Medication compliance: poor compliance Recent dose adjustment:yes Fatigue: yes Cold intolerance: no Heat intolerance: no Weight gain: no Weight loss: no Constipation: no Diarrhea/loose stools: no Palpitations: no Lower extremity edema: no Anxiety/depressed mood: no  HYPERTENSION / HYPERLIPIDEMIA Satisfied with current treatment? yes Duration of hypertension: chronic BP monitoring frequency: not checking BP medication side effects: no Past BP meds: metoprolol , lasix  Duration of hyperlipidemia: chronic Cholesterol medication side effects: no Cholesterol supplements: none Past cholesterol medications: simvastatin  Medication compliance: poor compliance Aspirin: no Recent stressors: no Recurrent headaches: no Visual changes: no Palpitations: no Dyspnea: no Chest pain: no Lower extremity edema: no Dizzy/lightheaded: no   Relevant past medical, surgical, family and social history reviewed and updated as indicated. Interim medical history since our last visit reviewed. Allergies and medications reviewed and updated.  Review of Systems  Constitutional:  Positive for fatigue. Negative for activity change, appetite change, chills, diaphoresis, fever and unexpected weight change.  HENT: Negative.  Negative for congestion, dental problem, drooling, ear discharge, ear pain, facial swelling, hearing loss, mouth sores, nosebleeds, postnasal drip,  rhinorrhea, sinus pressure, sinus pain, sneezing, sore throat, tinnitus, trouble swallowing and voice change.   Eyes: Negative.   Respiratory: Negative.    Cardiovascular: Negative.   Gastrointestinal: Negative.   Skin:  Positive for rash. Negative for color change, pallor and wound.  Neurological: Negative.   Psychiatric/Behavioral: Negative.      Per HPI unless specifically indicated above     Objective:    BP 128/73   Pulse 78   Temp 98.1 F (36.7 C) (Oral)   Ht 5' 11 (1.803 m)   Wt 161 lb (73 kg)   SpO2 97%   BMI 22.45 kg/m   Wt Readings from Last 3 Encounters:  04/10/24 161 lb (73 kg)  03/27/24 167 lb 1.6 oz (75.8 kg)  03/06/24 162 lb 8 oz (73.7 kg)    Physical Exam Vitals and nursing note reviewed.  Constitutional:      General: He is not in acute distress.    Appearance: Normal appearance. He is not ill-appearing, toxic-appearing or diaphoretic.  HENT:     Head: Normocephalic and atraumatic.     Right Ear: Tympanic membrane, ear canal and external ear normal.     Left Ear: Tympanic membrane, ear canal and external ear normal.     Nose: Nose normal. No congestion or rhinorrhea.     Mouth/Throat:     Mouth: Mucous membranes are moist.     Pharynx: Oropharynx is clear. No oropharyngeal exudate or posterior oropharyngeal erythema.  Eyes:     General: No scleral icterus.       Right eye: No discharge.        Left eye: No discharge.     Extraocular Movements: Extraocular movements intact.  Conjunctiva/sclera: Conjunctivae normal.     Pupils: Pupils are equal, round, and reactive to light.  Cardiovascular:     Rate and Rhythm: Normal rate and regular rhythm.     Pulses: Normal pulses.     Heart sounds: Normal heart sounds. No murmur heard.    No friction rub. No gallop.  Pulmonary:     Effort: Pulmonary effort is normal. No respiratory distress.     Breath sounds: Normal breath sounds. No stridor. No wheezing, rhonchi or rales.  Chest:     Chest wall: No  tenderness.  Musculoskeletal:        General: Normal range of motion.     Cervical back: Normal range of motion and neck supple.  Skin:    General: Skin is warm and dry.     Capillary Refill: Capillary refill takes less than 2 seconds.     Coloration: Skin is not jaundiced or pale.     Findings: Rash present. No bruising, erythema or lesion.  Neurological:     General: No focal deficit present.     Mental Status: He is alert and oriented to person, place, and time. Mental status is at baseline.  Psychiatric:        Mood and Affect: Mood normal.        Behavior: Behavior normal.        Thought Content: Thought content normal.        Judgment: Judgment normal.     Results for orders placed or performed in visit on 04/10/24  Microscopic Examination   Collection Time: 04/10/24 11:58 AM   BLD  Result Value Ref Range   WBC, UA None seen 0 - 5 /hpf   RBC, Urine 0-2 0 - 2 /hpf   Epithelial Cells (non renal) 0-10 0 - 10 /hpf   Bacteria, UA None seen None seen/Few  Bayer DCA Hb A1c Waived   Collection Time: 04/10/24 11:58 AM  Result Value Ref Range   HB A1C (BAYER DCA - WAIVED) 5.3 4.8 - 5.6 %  Microalbumin, Urine Waived   Collection Time: 04/10/24 11:58 AM  Result Value Ref Range   Microalb, Ur Waived 150 (H) 0 - 19 mg/L   Creatinine, Urine Waived 100 10 - 300 mg/dL   Microalb/Creat Ratio >300 (H) <30 mg/g  Urinalysis, Routine w reflex microscopic   Collection Time: 04/10/24 11:58 AM  Result Value Ref Range   Specific Gravity, UA 1.020 1.005 - 1.030   pH, UA 6.0 5.0 - 7.5   Color, UA Yellow Yellow   Appearance Ur Clear Clear   Leukocytes,UA Negative Negative   Protein,UA 3+ (A) Negative/Trace   Glucose, UA Negative Negative   Ketones, UA Negative Negative   RBC, UA Trace (A) Negative   Bilirubin, UA Negative Negative   Urobilinogen, Ur 0.2 0.2 - 1.0 mg/dL   Nitrite, UA Negative Negative   Microscopic Examination See below:   CBC with Differential/Platelet   Collection  Time: 04/10/24 12:01 PM  Result Value Ref Range   WBC 8.8 3.4 - 10.8 x10E3/uL   RBC 3.19 (L) 4.14 - 5.80 x10E6/uL   Hemoglobin 9.4 (L) 13.0 - 17.7 g/dL   Hematocrit 70.4 (L) 62.4 - 51.0 %   MCV 93 79 - 97 fL   MCH 29.5 26.6 - 33.0 pg   MCHC 31.9 31.5 - 35.7 g/dL   RDW 82.7 (H) 88.3 - 84.5 %   Platelets 249 150 - 450 x10E3/uL   Neutrophils 64 Not Estab. %  Lymphs 20 Not Estab. %   Monocytes 4 Not Estab. %   Eos 2 Not Estab. %   Basos 1 Not Estab. %   Immature Cells Note    Neutrophils Absolute 5.6 1.4 - 7.0 x10E3/uL   Lymphocytes Absolute 1.8 0.7 - 3.1 x10E3/uL   Monocytes Absolute 0.4 0.1 - 0.9 x10E3/uL   EOS (ABSOLUTE) 0.2 0.0 - 0.4 x10E3/uL   Basophils Absolute 0.1 0.0 - 0.2 x10E3/uL   NRBC 2 (H) 0 - 0 %   Hematology Comments: Note:   Comprehensive metabolic panel with GFR   Collection Time: 04/10/24 12:01 PM  Result Value Ref Range   Glucose 104 (H) 70 - 99 mg/dL   BUN 62 (H) 8 - 27 mg/dL   Creatinine, Ser 5.76 (H) 0.76 - 1.27 mg/dL   eGFR 13 (L) >40 fO/fpw/8.26   BUN/Creatinine Ratio 15 10 - 24   Sodium 138 134 - 144 mmol/L   Potassium 5.1 3.5 - 5.2 mmol/L   Chloride 98 96 - 106 mmol/L   CO2 21 20 - 29 mmol/L   Calcium  9.0 8.6 - 10.2 mg/dL   Total Protein 6.8 6.0 - 8.5 g/dL   Albumin  4.3 3.7 - 4.7 g/dL   Globulin, Total 2.5 1.5 - 4.5 g/dL   Bilirubin Total 0.5 0.0 - 1.2 mg/dL   Alkaline Phosphatase 150 (H) 48 - 129 IU/L   AST 23 0 - 40 IU/L   ALT 27 0 - 44 IU/L  Lipid Panel w/o Chol/HDL Ratio   Collection Time: 04/10/24 12:01 PM  Result Value Ref Range   Cholesterol, Total 118 100 - 199 mg/dL   Triglycerides 895 0 - 149 mg/dL   HDL 36 (L) >60 mg/dL   VLDL Cholesterol Cal 20 5 - 40 mg/dL   LDL Chol Calc (NIH) 62 0 - 99 mg/dL  TSH   Collection Time: 04/10/24 12:01 PM  Result Value Ref Range   TSH 46.400 (H) 0.450 - 4.500 uIU/mL  B12   Collection Time: 04/10/24 12:01 PM  Result Value Ref Range   Vitamin B-12 1,288 (H) 232 - 1,245 pg/mL  VITAMIN D  25  Hydroxy (Vit-D Deficiency, Fractures)   Collection Time: 04/10/24 12:01 PM  Result Value Ref Range   Vit D, 25-Hydroxy 42.9 30.0 - 100.0 ng/mL  PSA   Collection Time: 04/10/24 12:01 PM  Result Value Ref Range   Prostate Specific Ag, Serum 2.1 0.0 - 4.0 ng/mL  Immature Cells   Collection Time: 04/10/24 12:01 PM  Result Value Ref Range   Metamyelocytes 5 (H) 0 - 0 %   MYELOCYTES 4 (H) 0 - 0 %   *Note: Due to a large number of results and/or encounters for the requested time period, some results have not been displayed. A complete set of results can be found in Results Review.      Assessment & Plan:   Problem List Items Addressed This Visit       Digestive   Gastroesophageal reflux disease   Under good control on current regimen. Continue current regimen. Continue to monitor. Call with any concerns. Refills given. Labs drawn today.      Relevant Medications   pantoprazole  (PROTONIX ) 40 MG tablet   Other Relevant Orders   CBC with Differential/Platelet (Completed)   Comprehensive metabolic panel with GFR (Completed)     Endocrine   Hypothyroidism - Primary   Rechecking labs today. Await results. Treat as needed.       Relevant Medications  metoprolol  succinate (TOPROL -XL) 25 MG 24 hr tablet   levothyroxine  (SYNTHROID ) 150 MCG tablet   Other Relevant Orders   CBC with Differential/Platelet (Completed)   Comprehensive metabolic panel with GFR (Completed)   TSH (Completed)     Genitourinary   BPH (benign prostatic hyperplasia)   Under good control on current regimen. Continue current regimen. Continue to monitor. Call with any concerns. Refills given. Labs drawn today.       Relevant Medications   tamsulosin  (FLOMAX ) 0.4 MG CAPS capsule   Other Relevant Orders   CBC with Differential/Platelet (Completed)   Comprehensive metabolic panel with GFR (Completed)   PSA (Completed)   Benign hypertensive renal disease   Under good control on current regimen. Continue  current regimen. Continue to monitor. Call with any concerns. Refills given. Labs drawn today.        CKD (chronic kidney disease) stage 4, GFR 15-29 ml/min (HCC)   Rechecking labs today. Await results. Treat as needed.       Relevant Orders   CBC with Differential/Platelet (Completed)   Comprehensive metabolic panel with GFR (Completed)   Microalbumin, Urine Waived (Completed)     Hematopoietic and Hemostatic   Pancytopenia (HCC)   Rechecking labs today. Await results. Treat as needed. Continue to follow with hematology.       Relevant Orders   CBC with Differential/Platelet (Completed)   Comprehensive metabolic panel with GFR (Completed)     Other   Myelofibrosis (HCC)   Rechecking labs today. Await results. Treat as needed. Continue to follow with hematology.       Mixed hyperlipidemia   Under good control on current regimen. Continue current regimen. Continue to monitor. Call with any concerns. Refills given. Labs drawn today.      Relevant Medications   metoprolol  succinate (TOPROL -XL) 25 MG 24 hr tablet   furosemide  (LASIX ) 40 MG tablet   simvastatin  (ZOCOR ) 40 MG tablet   Other Relevant Orders   Lipid Panel w/o Chol/HDL Ratio (Completed)   Low vitamin B12 level   Rechecking labs today. Await results. Treat as needed.       Relevant Orders   CBC with Differential/Platelet (Completed)   Comprehensive metabolic panel with GFR (Completed)   B12 (Completed)   Vitamin D  deficiency   Rechecking labs today. Await results. Treat as needed.       Relevant Orders   CBC with Differential/Platelet (Completed)   Comprehensive metabolic panel with GFR (Completed)   VITAMIN D  25 Hydroxy (Vit-D Deficiency, Fractures) (Completed)   Other Visit Diagnoses       Dysuria       Will check urine. Await results. Treat as needed.   Relevant Orders   CBC with Differential/Platelet (Completed)   Comprehensive metabolic panel with GFR (Completed)   Urinalysis, Routine w reflex  microscopic (Completed)     Hyperglycemia       Labs drawn today. Await results.   Relevant Orders   Bayer DCA Hb A1c Waived (Completed)   CBC with Differential/Platelet (Completed)   Comprehensive metabolic panel with GFR (Completed)     Not immune to hepatitis B virus       Hep B vaccine #1 given today.   Relevant Orders   Heplisav-B (HepB-CPG) Vaccine (Completed)     Rash       Will treat with burst of prednisone . Call if not getting better or getting worse.   Relevant Medications   methylPREDNISolone  sodium succinate (SOLU-MEDROL ) 40 mg/mL injection 40 mg (Completed)  Follow up plan: Return in about 3 weeks (around 05/01/2024).

## 2024-04-11 ENCOUNTER — Ambulatory Visit: Admitting: Family Medicine

## 2024-04-11 ENCOUNTER — Encounter: Payer: Self-pay | Admitting: Oncology

## 2024-04-11 LAB — CBC WITH DIFFERENTIAL/PLATELET

## 2024-04-12 ENCOUNTER — Encounter: Payer: Self-pay | Admitting: Oncology

## 2024-04-12 LAB — COMPREHENSIVE METABOLIC PANEL WITH GFR
ALT: 27 IU/L (ref 0–44)
AST: 23 IU/L (ref 0–40)
Albumin: 4.3 g/dL (ref 3.7–4.7)
Alkaline Phosphatase: 150 IU/L — ABNORMAL HIGH (ref 48–129)
BUN/Creatinine Ratio: 15 (ref 10–24)
BUN: 62 mg/dL — ABNORMAL HIGH (ref 8–27)
Bilirubin Total: 0.5 mg/dL (ref 0.0–1.2)
CO2: 21 mmol/L (ref 20–29)
Calcium: 9 mg/dL (ref 8.6–10.2)
Chloride: 98 mmol/L (ref 96–106)
Creatinine, Ser: 4.23 mg/dL — ABNORMAL HIGH (ref 0.76–1.27)
Globulin, Total: 2.5 g/dL (ref 1.5–4.5)
Glucose: 104 mg/dL — ABNORMAL HIGH (ref 70–99)
Potassium: 5.1 mmol/L (ref 3.5–5.2)
Sodium: 138 mmol/L (ref 134–144)
Total Protein: 6.8 g/dL (ref 6.0–8.5)
eGFR: 13 mL/min/1.73 — ABNORMAL LOW (ref >59)

## 2024-04-12 LAB — LIPID PANEL W/O CHOL/HDL RATIO
Cholesterol, Total: 118 mg/dL (ref 100–199)
HDL: 36 mg/dL — ABNORMAL LOW (ref >39)
LDL Chol Calc (NIH): 62 mg/dL (ref 0–99)
Triglycerides: 104 mg/dL (ref 0–149)
VLDL Cholesterol Cal: 20 mg/dL (ref 5–40)

## 2024-04-12 LAB — CBC WITH DIFFERENTIAL/PLATELET
Basophils Absolute: 0.1 x10E3/uL (ref 0.0–0.2)
Basos: 1 %
EOS (ABSOLUTE): 0.2 x10E3/uL (ref 0.0–0.4)
Eos: 2 %
Hematocrit: 29.5 % — AB (ref 37.5–51.0)
Hemoglobin: 9.4 g/dL — AB (ref 13.0–17.7)
Lymphocytes Absolute: 1.8 x10E3/uL (ref 0.7–3.1)
Lymphs: 20 %
MCH: 29.5 pg (ref 26.6–33.0)
MCHC: 31.9 g/dL (ref 31.5–35.7)
MCV: 93 fL (ref 79–97)
Monocytes Absolute: 0.4 x10E3/uL (ref 0.1–0.9)
Monocytes: 4 %
NRBC: 2 % — AB (ref 0–0)
Neutrophils Absolute: 5.6 x10E3/uL (ref 1.4–7.0)
Neutrophils: 64 %
Platelets: 249 x10E3/uL (ref 150–450)
RBC: 3.19 x10E6/uL — AB (ref 4.14–5.80)
RDW: 17.2 % — AB (ref 11.6–15.4)
WBC: 8.8 x10E3/uL (ref 3.4–10.8)

## 2024-04-12 LAB — IMMATURE CELLS
MYELOCYTES: 4 % — ABNORMAL HIGH (ref 0–0)
Metamyelocytes: 5 % — ABNORMAL HIGH (ref 0–0)

## 2024-04-12 LAB — TSH: TSH: 46.4 u[IU]/mL — ABNORMAL HIGH (ref 0.450–4.500)

## 2024-04-12 LAB — VITAMIN B12: Vitamin B-12: 1288 pg/mL — ABNORMAL HIGH (ref 232–1245)

## 2024-04-12 LAB — VITAMIN D 25 HYDROXY (VIT D DEFICIENCY, FRACTURES): Vit D, 25-Hydroxy: 42.9 ng/mL (ref 30.0–100.0)

## 2024-04-12 LAB — PSA: Prostate Specific Ag, Serum: 2.1 ng/mL (ref 0.0–4.0)

## 2024-04-14 ENCOUNTER — Encounter: Payer: Self-pay | Admitting: Family Medicine

## 2024-04-14 MED ORDER — SIMVASTATIN 40 MG PO TABS: 40.0000 mg | ORAL_TABLET | Freq: Every day | ORAL | 1 refills | Status: AC

## 2024-04-14 MED ORDER — QUETIAPINE FUMARATE 25 MG PO TABS
25.0000 mg | ORAL_TABLET | Freq: Every evening | ORAL | 1 refills | Status: AC | PRN
Start: 2024-04-14 — End: ?

## 2024-04-14 MED ORDER — LEVOTHYROXINE SODIUM 150 MCG PO TABS
150.0000 ug | ORAL_TABLET | Freq: Every day | ORAL | 0 refills | Status: AC
Start: 2024-04-14 — End: ?

## 2024-04-14 MED ORDER — PANTOPRAZOLE SODIUM 40 MG PO TBEC: 40.0000 mg | DELAYED_RELEASE_TABLET | Freq: Every day | ORAL | 1 refills | Status: AC

## 2024-04-14 MED ORDER — PREDNISONE 50 MG PO TABS: 50.0000 mg | ORAL_TABLET | Freq: Every day | ORAL | 0 refills | Status: DC

## 2024-04-14 MED ORDER — METOPROLOL SUCCINATE ER 25 MG PO TB24
25.0000 mg | ORAL_TABLET | Freq: Two times a day (BID) | ORAL | 1 refills | Status: AC
Start: 2024-04-14 — End: ?

## 2024-04-14 MED ORDER — FUROSEMIDE 40 MG PO TABS: 40.0000 mg | ORAL_TABLET | Freq: Every day | ORAL | 0 refills | Status: DC

## 2024-04-14 MED ORDER — TAMSULOSIN HCL 0.4 MG PO CAPS: 0.4000 mg | ORAL_CAPSULE | Freq: Every day | ORAL | 1 refills | Status: AC

## 2024-04-14 MED ORDER — DULOXETINE HCL 20 MG PO CPEP: 20.0000 mg | ORAL_CAPSULE | Freq: Every day | ORAL | 1 refills | Status: AC

## 2024-04-14 MED ORDER — GABAPENTIN 400 MG PO CAPS
400.0000 mg | ORAL_CAPSULE | Freq: Every day | ORAL | 0 refills | Status: AC
Start: 2024-04-14 — End: ?

## 2024-04-14 NOTE — Assessment & Plan Note (Signed)
 Under good control on current regimen. Continue current regimen. Continue to monitor. Call with any concerns. Refills given. Labs drawn today.

## 2024-04-14 NOTE — Assessment & Plan Note (Signed)
Rechecking labs today. Await results. Treat as needed. Continue to follow with hematology.  ?

## 2024-04-14 NOTE — Assessment & Plan Note (Signed)
 Rechecking labs today. Await results. Treat as needed.

## 2024-04-15 ENCOUNTER — Telehealth: Payer: Self-pay | Admitting: Family Medicine

## 2024-04-15 ENCOUNTER — Ambulatory Visit: Payer: Self-pay | Admitting: Family Medicine

## 2024-04-15 NOTE — Telephone Encounter (Unsigned)
 Copied from CRM [id number]. Topic: Clinical - Lab/Test Results >> Apr 15, 2024  1:48 PM Avram MATSU wrote: Reason for CRM: I relayed the test results but wife would like a call from Brittany please call wife back about labs results, [phone] (M)

## 2024-04-16 NOTE — Telephone Encounter (Signed)
 Called and spoke to patient's wife. Read lab results to patient's wife again. Wife also states that the Predisone and Gabapentin  were not at the pharmacy for the patient. Explained that I would reach out to [facility] and check on these as both state confirmed by pharmacy. I told patient's wife that if she did not hear back from me, that the pharmacy was getting the prescriptions ready.   Called [facility]. Prednisone  was there and ready for pick up. Gabapentin  is being filled and will be ready for pick up as well.

## 2024-04-17 ENCOUNTER — Inpatient Hospital Stay

## 2024-04-17 ENCOUNTER — Encounter: Payer: Self-pay | Admitting: Oncology

## 2024-04-17 ENCOUNTER — Ambulatory Visit: Admitting: Oncology

## 2024-04-17 ENCOUNTER — Inpatient Hospital Stay: Attending: Oncology

## 2024-04-17 VITALS — BP 148/74

## 2024-04-17 DIAGNOSIS — D469 Myelodysplastic syndrome, unspecified: Secondary | ICD-10-CM | POA: Diagnosis not present

## 2024-04-17 DIAGNOSIS — M9901 Segmental and somatic dysfunction of cervical region: Secondary | ICD-10-CM | POA: Diagnosis not present

## 2024-04-17 DIAGNOSIS — M5033 Other cervical disc degeneration, cervicothoracic region: Secondary | ICD-10-CM | POA: Diagnosis not present

## 2024-04-17 DIAGNOSIS — M9903 Segmental and somatic dysfunction of lumbar region: Secondary | ICD-10-CM | POA: Diagnosis not present

## 2024-04-17 DIAGNOSIS — M5412 Radiculopathy, cervical region: Secondary | ICD-10-CM | POA: Diagnosis not present

## 2024-04-17 LAB — CBC WITH DIFFERENTIAL (CANCER CENTER ONLY)
Abs Immature Granulocytes: 0.95 K/uL — ABNORMAL HIGH (ref 0.00–0.07)
Basophils Absolute: 0.1 K/uL (ref 0.0–0.1)
Basophils Relative: 1 %
Eosinophils Absolute: 0.2 K/uL (ref 0.0–0.5)
Eosinophils Relative: 2 %
HCT: 25.1 % — ABNORMAL LOW (ref 39.0–52.0)
Hemoglobin: 7.8 g/dL — ABNORMAL LOW (ref 13.0–17.0)
Immature Granulocytes: 10 %
Lymphocytes Relative: 15 %
Lymphs Abs: 1.4 K/uL (ref 0.7–4.0)
MCH: 30 pg (ref 26.0–34.0)
MCHC: 31.1 g/dL (ref 30.0–36.0)
MCV: 96.5 fL (ref 80.0–100.0)
Monocytes Absolute: 1 K/uL (ref 0.1–1.0)
Monocytes Relative: 11 %
Neutro Abs: 5.8 K/uL (ref 1.7–7.7)
Neutrophils Relative %: 61 %
Platelet Count: 220 K/uL (ref 150–400)
RBC: 2.6 MIL/uL — ABNORMAL LOW (ref 4.22–5.81)
RDW: 19.3 % — ABNORMAL HIGH (ref 11.5–15.5)
WBC Count: 9.3 K/uL (ref 4.0–10.5)
nRBC: 2.4 % — ABNORMAL HIGH (ref 0.0–0.2)

## 2024-04-17 LAB — SAMPLE TO BLOOD BANK

## 2024-04-17 MED ORDER — LUSPATERCEPT-AAMT 75 MG ~~LOC~~ SOLR
125.0000 mg | Freq: Once | SUBCUTANEOUS | Status: AC
  Administered 2024-04-17: 125 mg via SUBCUTANEOUS
  Filled 2024-04-17: qty 1.5

## 2024-04-19 ENCOUNTER — Encounter

## 2024-04-25 ENCOUNTER — Encounter (INDEPENDENT_AMBULATORY_CARE_PROVIDER_SITE_OTHER): Admitting: Ophthalmology

## 2024-04-25 DIAGNOSIS — H3412 Central retinal artery occlusion, left eye: Secondary | ICD-10-CM

## 2024-04-25 DIAGNOSIS — H35033 Hypertensive retinopathy, bilateral: Secondary | ICD-10-CM

## 2024-04-25 DIAGNOSIS — H43813 Vitreous degeneration, bilateral: Secondary | ICD-10-CM

## 2024-04-25 DIAGNOSIS — H338 Other retinal detachments: Secondary | ICD-10-CM | POA: Diagnosis not present

## 2024-04-25 DIAGNOSIS — I10 Essential (primary) hypertension: Secondary | ICD-10-CM | POA: Diagnosis not present

## 2024-04-25 DIAGNOSIS — H33302 Unspecified retinal break, left eye: Secondary | ICD-10-CM | POA: Diagnosis not present

## 2024-04-26 ENCOUNTER — Other Ambulatory Visit

## 2024-04-26 ENCOUNTER — Ambulatory Visit: Admitting: Oncology

## 2024-04-26 ENCOUNTER — Other Ambulatory Visit: Payer: Self-pay

## 2024-04-29 ENCOUNTER — Ambulatory Visit: Admitting: Family Medicine

## 2024-04-29 ENCOUNTER — Encounter: Payer: Self-pay | Admitting: Family Medicine

## 2024-04-29 VITALS — BP 167/91 | HR 54 | Temp 97.9°F | Ht 71.0 in | Wt 164.0 lb

## 2024-04-29 DIAGNOSIS — E039 Hypothyroidism, unspecified: Secondary | ICD-10-CM

## 2024-04-29 DIAGNOSIS — R808 Other proteinuria: Secondary | ICD-10-CM | POA: Diagnosis not present

## 2024-04-29 DIAGNOSIS — D61818 Other pancytopenia: Secondary | ICD-10-CM

## 2024-04-29 DIAGNOSIS — N401 Enlarged prostate with lower urinary tract symptoms: Secondary | ICD-10-CM

## 2024-04-29 DIAGNOSIS — R21 Rash and other nonspecific skin eruption: Secondary | ICD-10-CM | POA: Diagnosis not present

## 2024-04-29 DIAGNOSIS — N2581 Secondary hyperparathyroidism of renal origin: Secondary | ICD-10-CM | POA: Diagnosis not present

## 2024-04-29 DIAGNOSIS — N185 Chronic kidney disease, stage 5: Secondary | ICD-10-CM | POA: Diagnosis not present

## 2024-04-29 DIAGNOSIS — N184 Chronic kidney disease, stage 4 (severe): Secondary | ICD-10-CM | POA: Diagnosis not present

## 2024-04-29 DIAGNOSIS — D631 Anemia in chronic kidney disease: Secondary | ICD-10-CM | POA: Diagnosis not present

## 2024-04-29 DIAGNOSIS — D7581 Myelofibrosis: Secondary | ICD-10-CM | POA: Diagnosis not present

## 2024-04-29 DIAGNOSIS — I129 Hypertensive chronic kidney disease with stage 1 through stage 4 chronic kidney disease, or unspecified chronic kidney disease: Secondary | ICD-10-CM

## 2024-04-29 DIAGNOSIS — K219 Gastro-esophageal reflux disease without esophagitis: Secondary | ICD-10-CM | POA: Diagnosis not present

## 2024-04-29 DIAGNOSIS — N189 Chronic kidney disease, unspecified: Secondary | ICD-10-CM | POA: Diagnosis not present

## 2024-04-29 MED ORDER — TRIAMCINOLONE ACETONIDE 0.5 % EX OINT: 1.0000 | TOPICAL_OINTMENT | Freq: Two times a day (BID) | CUTANEOUS | 0 refills | Status: DC

## 2024-04-29 NOTE — Assessment & Plan Note (Signed)
 Just started on finasteride by nephrology this AM. Will see how he does. Call with any concerns.

## 2024-04-29 NOTE — Assessment & Plan Note (Signed)
 Med adjusted 3 weeks ago. Concern that he is not taking his medicine. Will get [facility] involved and will recheck labs in 3 weeks. Adjust dose as needed.

## 2024-04-29 NOTE — Assessment & Plan Note (Signed)
Continue to follow with hematology. Call with any concerns. Continue to monitor.  ?

## 2024-04-29 NOTE — Assessment & Plan Note (Signed)
 Nephrology considering dialysis. Would like him to see vascular. Referral placed today. Await their input.

## 2024-04-29 NOTE — Assessment & Plan Note (Signed)
 BP running high- had been better 3 weeks ago. Will recheck in 3 weeks. If still high add amlodipine  per nephrology.

## 2024-04-29 NOTE — Progress Notes (Signed)
 BP (!) 167/91 (BP Location: Left Arm, Cuff Size: Large)   Pulse (!) 54   Temp 97.9 F (36.6 C) (Oral)   Ht 5' 11 (1.803 m)   Wt 164 lb (74.4 kg)   SpO2 98%   BMI 22.87 kg/m    Subjective:    Patient ID: [name], male    DOB: [birth year], 83 y.o.   MRN: [id number]  HPI: SHARONE ALMOND is a 83 y.o. male  Chief Complaint  Patient presents with   Rash    Back and arms. Reoccurring.    Hypothyroidism   Rash is much better, but still feeling itchy- his prednisone  kept him up at night so he stopped it.   HYPOTHYROIDISM Thyroid  control status:uncontrolled Satisfied with current treatment? no Medication side effects: no Medication compliance: excellent compliance Recent dose adjustment:yes Fatigue: yes Cold intolerance: no Heat intolerance: no Weight gain: no Weight loss: no Constipation: no Diarrhea/loose stools: no Palpitations: no Lower extremity edema: no Anxiety/depressed mood: no  HYPERTENSION  Hypertension status: uncontrolled  Satisfied with current treatment? no Duration of hypertension: chronic BP monitoring frequency:  a few times a month BP medication side effects:  no Medication compliance: good compliance Previous BP meds: metoprolol  Aspirin: no Recurrent headaches: no Visual changes: no Palpitations: no Dyspnea: no Chest pain: no Lower extremity edema: no Dizzy/lightheaded: no   Relevant past medical, surgical, family and social history reviewed and updated as indicated. Interim medical history since our last visit reviewed. Allergies and medications reviewed and updated.  Review of Systems  Constitutional: Negative.   Respiratory: Negative.    Cardiovascular: Negative.   Musculoskeletal: Negative.   Neurological: Negative.   Psychiatric/Behavioral: Negative.      Per HPI unless specifically indicated above     Objective:    BP (!) 167/91 (BP Location: Left Arm, Cuff Size: Large)   Pulse (!) 54   Temp 97.9 F (36.6 C) (Oral)    Ht 5' 11 (1.803 m)   Wt 164 lb (74.4 kg)   SpO2 98%   BMI 22.87 kg/m   Wt Readings from Last 3 Encounters:  04/29/24 164 lb (74.4 kg)  04/10/24 161 lb (73 kg)  03/27/24 167 lb 1.6 oz (75.8 kg)    Physical Exam Vitals and nursing note reviewed.  Constitutional:      General: He is not in acute distress.    Appearance: Normal appearance. He is ill-appearing. He is not toxic-appearing or diaphoretic.  HENT:     Head: Normocephalic and atraumatic.     Right Ear: External ear normal.     Left Ear: External ear normal.     Nose: Nose normal.     Mouth/Throat:     Mouth: Mucous membranes are moist.     Pharynx: Oropharynx is clear.  Eyes:     General: No scleral icterus.       Right eye: No discharge.        Left eye: No discharge.     Extraocular Movements: Extraocular movements intact.     Conjunctiva/sclera: Conjunctivae normal.     Pupils: Pupils are equal, round, and reactive to light.  Cardiovascular:     Rate and Rhythm: Normal rate and regular rhythm.     Pulses: Normal pulses.     Heart sounds: Normal heart sounds. No murmur heard.    No friction rub. No gallop.  Pulmonary:     Effort: Pulmonary effort is normal. No respiratory distress.     Breath  sounds: Normal breath sounds. No stridor. No wheezing, rhonchi or rales.  Chest:     Chest wall: No tenderness.  Musculoskeletal:        General: Normal range of motion.     Cervical back: Normal range of motion and neck supple.  Skin:    General: Skin is warm and dry.     Capillary Refill: Capillary refill takes less than 2 seconds.     Coloration: Skin is not jaundiced or pale.     Findings: No bruising, erythema, lesion or rash.  Neurological:     General: No focal deficit present.     Mental Status: He is alert and oriented to person, place, and time. Mental status is at baseline.  Psychiatric:        Mood and Affect: Mood normal.        Behavior: Behavior normal.        Thought Content: Thought content  normal.        Judgment: Judgment normal.     Results for orders placed or performed in visit on 04/17/24  CBC with Differential ([facility] Only)   Collection Time: 04/17/24  9:29 AM  Result Value Ref Range   WBC Count 9.3 4.0 - 10.5 K/uL   RBC 2.60 (L) 4.22 - 5.81 MIL/uL   Hemoglobin 7.8 (L) 13.0 - 17.0 g/dL   HCT 74.8 (L) 60.9 - 47.9 %   MCV 96.5 80.0 - 100.0 fL   MCH 30.0 26.0 - 34.0 pg   MCHC 31.1 30.0 - 36.0 g/dL   RDW 80.6 (H) 88.4 - 84.4 %   Platelet Count 220 150 - 400 K/uL   nRBC 2.4 (H) 0.0 - 0.2 %   Neutrophils Relative % 61 %   Neutro Abs 5.8 1.7 - 7.7 K/uL   Lymphocytes Relative 15 %   Lymphs Abs 1.4 0.7 - 4.0 K/uL   Monocytes Relative 11 %   Monocytes Absolute 1.0 0.1 - 1.0 K/uL   Eosinophils Relative 2 %   Eosinophils Absolute 0.2 0.0 - 0.5 K/uL   Basophils Relative 1 %   Basophils Absolute 0.1 0.0 - 0.1 K/uL   WBC Morphology See Note    Smear Review See Note    Immature Granulocytes 10 %   Abs Immature Granulocytes 0.95 (H) 0.00 - 0.07 K/uL   Polychromasia PRESENT    Ovalocytes PRESENT   Sample to Blood Bank   Collection Time: 04/17/24  9:29 AM  Result Value Ref Range   Blood Bank Specimen SAMPLE AVAILABLE FOR TESTING    Sample Expiration      04/20/2024,2359 Performed at [facility], [address] [place], [place] [address]    *Note: Due to a large number of results and/or encounters for the requested time period, some results have not been displayed. A complete set of results can be found in Results Review.      Assessment & Plan:   Problem List Items Addressed This Visit       Digestive   Gastroesophageal reflux disease   Relevant Orders   AMB Referral [facility] Care Management     Endocrine   Hypothyroidism - Primary   Med adjusted 3 weeks ago. Concern that he is not taking his medicine. Will get [facility] involved and will recheck labs in 3 weeks. Adjust dose as needed.       Relevant Orders   AMB Referral [facility] Care Management      Genitourinary   BPH (benign  prostatic hyperplasia)   Just started on finasteride by nephrology this AM. Will see how he does. Call with any concerns.       Relevant Orders   AMB Referral [facility] Care Management   Benign hypertensive renal disease   BP running high- had been better 3 weeks ago. Will recheck in 3 weeks. If still high add amlodipine  per nephrology.       Relevant Orders   AMB Referral [facility] Care Management   CKD (chronic kidney disease) stage 4, GFR 15-29 ml/min [facility])   Nephrology considering dialysis. Would like him to see vascular. Referral placed today. Await their input.       Relevant Orders   AMB Referral [facility] Care Management   Ambulatory referral to Vascular Surgery     Hematopoietic and Hemostatic   Pancytopenia (HCC)   Continue to follow with hematology. Call with any concerns. Continue to monitor.       Relevant Orders   AMB Referral [facility] Care Management     Other   Myelofibrosis (HCC)   Continue to follow with hematology. Call with any concerns. Continue to monitor.       Relevant Orders   AMB Referral [facility] Care Management   Other Visit Diagnoses       Rash       Significantly better. Now just very dry. Moisturize. PRN triamcinalone. Call with any concerns.        Follow up plan: Return in about 3 weeks (around 05/20/2024) for OK to book both he and his wife on 12/18 (1:40 and 2:40).

## 2024-05-01 ENCOUNTER — Other Ambulatory Visit: Payer: Self-pay

## 2024-05-08 ENCOUNTER — Encounter: Payer: Self-pay | Admitting: Oncology

## 2024-05-08 ENCOUNTER — Other Ambulatory Visit: Payer: Self-pay | Admitting: Oncology

## 2024-05-08 ENCOUNTER — Other Ambulatory Visit: Payer: Self-pay

## 2024-05-08 ENCOUNTER — Inpatient Hospital Stay

## 2024-05-08 ENCOUNTER — Inpatient Hospital Stay: Attending: Oncology

## 2024-05-08 ENCOUNTER — Telehealth: Payer: Self-pay

## 2024-05-08 VITALS — BP 156/85

## 2024-05-08 DIAGNOSIS — D649 Anemia, unspecified: Secondary | ICD-10-CM

## 2024-05-08 DIAGNOSIS — D469 Myelodysplastic syndrome, unspecified: Secondary | ICD-10-CM

## 2024-05-08 DIAGNOSIS — M5033 Other cervical disc degeneration, cervicothoracic region: Secondary | ICD-10-CM | POA: Diagnosis not present

## 2024-05-08 DIAGNOSIS — M5412 Radiculopathy, cervical region: Secondary | ICD-10-CM | POA: Diagnosis not present

## 2024-05-08 DIAGNOSIS — M9901 Segmental and somatic dysfunction of cervical region: Secondary | ICD-10-CM | POA: Diagnosis not present

## 2024-05-08 DIAGNOSIS — M9903 Segmental and somatic dysfunction of lumbar region: Secondary | ICD-10-CM | POA: Diagnosis not present

## 2024-05-08 LAB — CBC (CANCER CENTER ONLY)
HCT: 22.6 % — ABNORMAL LOW (ref 39.0–52.0)
Hemoglobin: 7 g/dL — ABNORMAL LOW (ref 13.0–17.0)
MCH: 29.5 pg (ref 26.0–34.0)
MCHC: 31 g/dL (ref 30.0–36.0)
MCV: 95.4 fL (ref 80.0–100.0)
Platelet Count: 275 K/uL (ref 150–400)
RBC: 2.37 MIL/uL — ABNORMAL LOW (ref 4.22–5.81)
RDW: 19.1 % — ABNORMAL HIGH (ref 11.5–15.5)
WBC Count: 8.8 K/uL (ref 4.0–10.5)
nRBC: 1.1 % — ABNORMAL HIGH (ref 0.0–0.2)

## 2024-05-08 LAB — IRON AND TIBC
Iron: 55 ug/dL (ref 45–182)
Saturation Ratios: 30 % (ref 17.9–39.5)
TIBC: 183 ug/dL — ABNORMAL LOW (ref 250–450)
UIBC: 128 ug/dL

## 2024-05-08 LAB — CMP (CANCER CENTER ONLY)
ALT: 25 U/L (ref 0–44)
AST: 20 U/L (ref 15–41)
Albumin: 3.4 g/dL — ABNORMAL LOW (ref 3.5–5.0)
Alkaline Phosphatase: 133 U/L — ABNORMAL HIGH (ref 38–126)
Anion gap: 13 (ref 5–15)
BUN: 72 mg/dL — ABNORMAL HIGH (ref 8–23)
CO2: 21 mmol/L — ABNORMAL LOW (ref 22–32)
Calcium: 8.2 mg/dL — ABNORMAL LOW (ref 8.9–10.3)
Chloride: 108 mmol/L (ref 98–111)
Creatinine: 5.48 mg/dL — ABNORMAL HIGH (ref 0.61–1.24)
GFR, Estimated: 10 mL/min — ABNORMAL LOW (ref >60)
Glucose, Bld: 141 mg/dL — ABNORMAL HIGH (ref 70–99)
Potassium: 4.9 mmol/L (ref 3.5–5.1)
Sodium: 143 mmol/L (ref 135–145)
Total Bilirubin: 0.4 mg/dL (ref 0.0–1.2)
Total Protein: 5.8 g/dL — ABNORMAL LOW (ref 6.5–8.1)

## 2024-05-08 LAB — PREPARE RBC (CROSSMATCH)

## 2024-05-08 LAB — FERRITIN: Ferritin: 1478 ng/mL — ABNORMAL HIGH (ref 24–336)

## 2024-05-08 MED ORDER — LUSPATERCEPT-AAMT 75 MG ~~LOC~~ SOLR
1.7500 mg/kg | Freq: Once | SUBCUTANEOUS | Status: AC
  Administered 2024-05-08: 130 mg via SUBCUTANEOUS
  Filled 2024-05-08: qty 2.6

## 2024-05-08 NOTE — Progress Notes (Signed)
 Complex Care Management Note  Care Guide Note 05/08/2024 Name: [name] MRN: [id number] DOB: Oct 10, 1940  [name] is a 83 y.o. year old male who sees Vicci Duwaine SQUIBB, DO for primary care. I reached out to [name] by phone today to offer complex care management services.  Sr. Clingan was given information about Complex Care Management services today including:   The Complex Care Management services include support from the care team which includes your Nurse Care Manager, Clinical Social Worker, or Pharmacist.  The Complex Care Management team is here to help remove barriers to the health concerns and goals most important to you. Complex Care Management services are voluntary, and the patient may decline or stop services at any time by request to their care team member.   Complex Care Management Consent Status: Patient agreed to services and verbal consent obtained.   Follow up plan:  Telephone appointment with complex care management team member scheduled for:  RNCM 05/10/2024 Pharm d 05/16/2024  Encounter Outcome:  Patient Scheduled  Jeoffrey Buffalo , RMA     [place]  [facility], [facility] Guide  Direct Dial: [phone]  Website: [email or web address]

## 2024-05-09 ENCOUNTER — Inpatient Hospital Stay

## 2024-05-09 ENCOUNTER — Inpatient Hospital Stay: Admitting: Nurse Practitioner

## 2024-05-09 ENCOUNTER — Other Ambulatory Visit: Payer: Self-pay

## 2024-05-09 ENCOUNTER — Emergency Department
Admission: EM | Admit: 2024-05-09 | Discharge: 2024-05-09 | Disposition: A | Discharge status: Home / Self Care | Attending: Emergency Medicine | Admitting: Emergency Medicine

## 2024-05-09 ENCOUNTER — Emergency Department

## 2024-05-09 DIAGNOSIS — I509 Heart failure, unspecified: Secondary | ICD-10-CM | POA: Diagnosis not present

## 2024-05-09 DIAGNOSIS — I130 Hypertensive heart and chronic kidney disease with heart failure and stage 1 through stage 4 chronic kidney disease, or unspecified chronic kidney disease: Secondary | ICD-10-CM | POA: Insufficient documentation

## 2024-05-09 DIAGNOSIS — Z79899 Other long term (current) drug therapy: Secondary | ICD-10-CM | POA: Insufficient documentation

## 2024-05-09 DIAGNOSIS — N189 Chronic kidney disease, unspecified: Secondary | ICD-10-CM | POA: Insufficient documentation

## 2024-05-09 DIAGNOSIS — R9082 White matter disease, unspecified: Secondary | ICD-10-CM | POA: Diagnosis not present

## 2024-05-09 DIAGNOSIS — I160 Hypertensive urgency: Secondary | ICD-10-CM | POA: Diagnosis not present

## 2024-05-09 DIAGNOSIS — D649 Anemia, unspecified: Secondary | ICD-10-CM

## 2024-05-09 DIAGNOSIS — R42 Dizziness and giddiness: Secondary | ICD-10-CM

## 2024-05-09 DIAGNOSIS — I2510 Atherosclerotic heart disease of native coronary artery without angina pectoris: Secondary | ICD-10-CM | POA: Insufficient documentation

## 2024-05-09 DIAGNOSIS — D469 Myelodysplastic syndrome, unspecified: Secondary | ICD-10-CM | POA: Diagnosis not present

## 2024-05-09 DIAGNOSIS — I10 Essential (primary) hypertension: Secondary | ICD-10-CM | POA: Diagnosis not present

## 2024-05-09 LAB — COMPREHENSIVE METABOLIC PANEL WITH GFR
ALT: 21 U/L (ref 0–44)
AST: 22 U/L (ref 15–41)
Albumin: 3.7 g/dL (ref 3.5–5.0)
Alkaline Phosphatase: 139 U/L — ABNORMAL HIGH (ref 38–126)
Anion gap: 14 (ref 5–15)
BUN: 67 mg/dL — ABNORMAL HIGH (ref 8–23)
CO2: 20 mmol/L — ABNORMAL LOW (ref 22–32)
Calcium: 8.4 mg/dL — ABNORMAL LOW (ref 8.9–10.3)
Chloride: 108 mmol/L (ref 98–111)
Creatinine, Ser: 4.9 mg/dL — ABNORMAL HIGH (ref 0.61–1.24)
GFR, Estimated: 11 mL/min — ABNORMAL LOW (ref >60)
Glucose, Bld: 141 mg/dL — ABNORMAL HIGH (ref 70–99)
Potassium: 4.7 mmol/L (ref 3.5–5.1)
Sodium: 141 mmol/L (ref 135–145)
Total Bilirubin: 0.7 mg/dL (ref 0.0–1.2)
Total Protein: 6.3 g/dL — ABNORMAL LOW (ref 6.5–8.1)

## 2024-05-09 LAB — CBC
HCT: 29.1 % — ABNORMAL LOW (ref 39.0–52.0)
Hemoglobin: 9.1 g/dL — ABNORMAL LOW (ref 13.0–17.0)
MCH: 29.9 pg (ref 26.0–34.0)
MCHC: 31.3 g/dL (ref 30.0–36.0)
MCV: 95.7 fL (ref 80.0–100.0)
Platelets: 339 K/uL (ref 150–400)
RBC: 3.04 MIL/uL — ABNORMAL LOW (ref 4.22–5.81)
RDW: 18.3 % — ABNORMAL HIGH (ref 11.5–15.5)
WBC: 10.8 K/uL — ABNORMAL HIGH (ref 4.0–10.5)
nRBC: 1.2 % — ABNORMAL HIGH (ref 0.0–0.2)

## 2024-05-09 LAB — DIFFERENTIAL
Abs Immature Granulocytes: 1.3 K/uL — ABNORMAL HIGH (ref 0.00–0.07)
Basophils Absolute: 0.1 K/uL (ref 0.0–0.1)
Basophils Relative: 1 %
Eosinophils Absolute: 0.2 K/uL (ref 0.0–0.5)
Eosinophils Relative: 1 %
Immature Granulocytes: 12 %
Lymphocytes Relative: 17 %
Lymphs Abs: 1.8 K/uL (ref 0.7–4.0)
Monocytes Absolute: 1 K/uL (ref 0.1–1.0)
Monocytes Relative: 10 %
Neutro Abs: 6.5 K/uL (ref 1.7–7.7)
Neutrophils Relative %: 59 %
Smear Review: NORMAL

## 2024-05-09 LAB — PATHOLOGIST SMEAR REVIEW

## 2024-05-09 LAB — ETHANOL: Alcohol, Ethyl (B): <15 mg/dL (ref <15)

## 2024-05-09 LAB — RESP PANEL BY RT-PCR (RSV, FLU A&B, COVID)  RVPGX2
Influenza A by PCR: NEGATIVE
Influenza B by PCR: NEGATIVE
Resp Syncytial Virus by PCR: NEGATIVE
SARS Coronavirus 2 by RT PCR: NEGATIVE

## 2024-05-09 LAB — CBG MONITORING, ED: Glucose-Capillary: 132 mg/dL — ABNORMAL HIGH (ref 70–99)

## 2024-05-09 LAB — PROTIME-INR
INR: 1.1 (ref 0.8–1.2)
Prothrombin Time: 15.3 s — ABNORMAL HIGH (ref 11.4–15.2)

## 2024-05-09 LAB — APTT: aPTT: 55 s — ABNORMAL HIGH (ref 24–36)

## 2024-05-09 MED ORDER — ACETAMINOPHEN 500 MG PO TABS
1000.0000 mg | ORAL_TABLET | Freq: Once | ORAL | Status: AC
  Administered 2024-05-09: 1000 mg via ORAL
  Filled 2024-05-09: qty 2

## 2024-05-09 MED ORDER — HYDRALAZINE HCL 20 MG/ML IJ SOLN
10.0000 mg | Freq: Once | INTRAMUSCULAR | Status: AC
  Administered 2024-05-09: 10 mg via INTRAVENOUS

## 2024-05-09 MED ORDER — AMLODIPINE BESYLATE 5 MG PO TABS: 5.0000 mg | ORAL_TABLET | Freq: Every day | ORAL | 2 refills | Status: DC

## 2024-05-09 MED ORDER — SODIUM CHLORIDE 0.9% FLUSH: 3.0000 mL | Freq: Once | INTRAVENOUS | Status: DC

## 2024-05-09 MED ORDER — SODIUM CHLORIDE 0.9% IV SOLUTION
250.0000 mL | INTRAVENOUS | Status: DC
  Administered 2024-05-09: 100 mL via INTRAVENOUS
  Filled 2024-05-09: qty 250

## 2024-05-09 MED ORDER — HYDRALAZINE HCL 20 MG/ML IJ SOLN
10.0000 mg | Freq: Once | INTRAMUSCULAR | Status: AC
  Administered 2024-05-09: 10 mg via INTRAVENOUS
  Filled 2024-05-09: qty 1

## 2024-05-09 MED ORDER — AMLODIPINE BESYLATE 5 MG PO TABS
5.0000 mg | ORAL_TABLET | Freq: Once | ORAL | Status: AC
  Administered 2024-05-09: 5 mg via ORAL
  Filled 2024-05-09: qty 1

## 2024-05-09 MED ORDER — HYDRALAZINE HCL 20 MG/ML IJ SOLN
10.0000 mg | Freq: Once | INTRAMUSCULAR | Status: DC
  Filled 2024-05-09: qty 1

## 2024-05-09 MED ORDER — FUROSEMIDE 10 MG/ML IJ SOLN
40.0000 mg | Freq: Once | INTRAMUSCULAR | Status: AC
  Administered 2024-05-09: 40 mg via INTRAVENOUS
  Filled 2024-05-09: qty 4

## 2024-05-09 MED ORDER — ACETAMINOPHEN 325 MG PO TABS
650.0000 mg | ORAL_TABLET | Freq: Once | ORAL | Status: AC
  Administered 2024-05-09: 650 mg via ORAL
  Filled 2024-05-09: qty 2

## 2024-05-09 NOTE — ED Notes (Signed)
 EDP at bedside to update pt on plan of care.

## 2024-05-09 NOTE — ED Notes (Addendum)
 This RN repositioned BP cuff to under pt's hoody sleeve against pt's skin in order to obtain a more accurate  BP reading.

## 2024-05-09 NOTE — ED Notes (Signed)
 Per EDP and Day RN, we are not doing NIH scale assessments on this pt.

## 2024-05-09 NOTE — Progress Notes (Signed)
 Hematology/Oncology Consult note [facility]  [phone] [phone]  Patient Care Team: Vicci Duwaine SQUIBB, DO as PCP - General (Family Medicine) Perla Evalene PARAS, MD as PCP - Cardiology (Cardiology) Etha Quinten Ludwig, MD as Referring Physician (Hematology) Alvia Norleen BIRCH, MD as Consulting Physician (Ophthalmology) Rosemarie Eather RAMAN, MD as Consulting Physician (Neurology) Nieves Cough, MD as Consulting Physician (Urology) Perla Evalene PARAS, MD as Consulting Physician (Cardiology) Twylla Glendia BROCKS, MD (Urology) Melanee Annah BROCKS, MD as Consulting Physician (Oncology) Willma Camelia CROME, RN (Inactive) as [facility] Care Management   Name of the patient: [name]  [id number]  1940-09-30   Date of visit: 05/09/24  Diagnosis-MDS/MPN  Chief complaint/ Reason for visit-routine 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  Heme/Onc history:  Patient is a 83 year old male diagnosed with primary myelofibrosis back in 2016.  At that time he was found to have a mild splenomegaly of 15.8 cm.DIPPS score is 27 (age 68- 1, hemoglobin less than 10- 2) and score of 4 if 1% circulating blasts included from 07/17/2014.   Bone marrow on 06/11/2014 was most consistent with primary myelofibrosis.  Bone marrow biopsy showed 1% abnormal cells: CD45+, CD5+, CD10, CD11c+/-, CD19+, CD2-+, (dim), CD22+ (dim, CD23+, CD38-/+, FMC7-, HLA-DR+, sig lambda+(dim).  Blasts were not increased 1.2%; hypercellular for age: 54%; JAK2 V617F mutation was negative.  CALR mutation positive.  Flow cytometry included about 1% CLL/SLL phenotype cells (CD5+) of uncertain significance and some infiltrate into the marrow with increased atypical megakaryocytes.  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  MDS FISH panel was negative.   Patient  also follows up with [facility] benign hematology Dr. Margart Ruddy for his anemia.  Patient's hemoglobin was drifting down to the eights and was started on EPO in October 2022.   Patient has had worsening anemia requiring intermittent blood transfusions despite receiving Retacrit  since October 2024.  PatientUnderwent a repeat bone marrow biopsy in February 2025.  Findings were consistent with myelofibrosis with evidence of possible dysplasia and increased ringed sideroblasts and questionable myeloid dysgranulopoiesis.  Cytogenetic studies could not be completed due to no metaphase cells available for analysis.  Neo[employer] profile was also not completed due to limited specimen and poor quality DNA in the specimen.  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  With a recent hemoglobin down to 7.  He was here in infusion getting his blood transfusion.  He also has a significant medical history that includes advanced chronic kidney disease, stroke, and MI.  After blood transfusion was complete patient had a significantly elevated blood pressure.  During my visit with him he endorses headache with dizziness and blurred vision.  His blood pressure was significantly elevated over 200 systolic.  Due to his history we will send him to the emergency room with further evaluation.  He did drive himself to clinic.  He is in agreement with ED visit to address hypertension.  He reports that he did have all of his daily medication.  However his blood pressure has been running high per his report.  ECOG PS- 2 Pain scale- 0  Review of systems- Review of Systems  Constitutional:  Positive for malaise/fatigue. Negative for chills, fever and  weight loss.  HENT:  Negative for congestion, ear discharge and nosebleeds.   Eyes:  Negative for blurred vision.  Respiratory:  Negative  for cough, hemoptysis, sputum production, shortness of breath and wheezing.   Cardiovascular:  Negative for chest pain, palpitations, orthopnea and claudication.  Gastrointestinal:  Negative for abdominal pain, blood in stool, constipation, diarrhea, heartburn, melena, nausea and vomiting.  Genitourinary:  Negative for dysuria, flank pain, frequency, hematuria and urgency.  Musculoskeletal:  Negative for back pain, joint pain and myalgias.       Muscle twitches  Skin:  Negative for rash.  Neurological:  Positive for dizziness, weakness and headaches. Negative for tingling, focal weakness and seizures.  Endo/Heme/Allergies:  Does not bruise/bleed easily.  Psychiatric/Behavioral:  Negative for depression and suicidal ideas. The patient does not have insomnia.       Allergies  Allergen Reactions   Quinolones Other (See Comments)    Patient with aortic aneurysm. Use of quinolones are considered to be contraindicated due to risk of aortic rupture.   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   right forearm, [facility]   Stroke (HCC) 11/2007   R brain subcortical infarct     Past Surgical History:  Procedure Laterality Date   ASPIRATION / INJECTION RENAL CYST  07/08/2015   BACK SURGERY     approx 20- 25 years ago   COLONOSCOPY     EYE SURGERY     cataract both eyes   GAS INSERTION  08/11/2011   Procedure: INSERTION OF GAS;  Surgeon: Norleen JONETTA Ku, MD;  Location: [facility] OR;  Service: Ophthalmology;  Laterality: Right;  [id number]   IR BONE MARROW BIOPSY & ASPIRATION  08/03/2023   IR FLUORO GUIDE CV LINE RIGHT  10/23/2023   IR REMOVAL TUN CV CATH W/O FL  11/07/2023   IR THORACENTESIS ASP PLEURAL SPACE W/IMG GUIDE  09/22/2023   IR THORACENTESIS ASP PLEURAL SPACE W/IMG GUIDE  10/02/2023   LEFT HEART CATH AND CORONARY ANGIOGRAPHY N/A 05/02/2023   Procedure: LEFT HEART CATH AND CORONARY ANGIOGRAPHY;  Surgeon: Mady Bruckner, MD;  Location: [facility] INVASIVE CV LAB;  Service: Cardiovascular;  Laterality: N/A;   REVERSE SHOULDER ARTHROPLASTY Right 04/13/2023   Procedure: REVERSE SHOULDER ARTHROPLASTY;  Surgeon: Edie Norleen PARAS, MD;  Location: [facility] ORS;  Service: Orthopedics;  Laterality: Right;   SCLERAL BUCKLE  08/11/2011   Procedure: SCLERAL BUCKLE;  Surgeon: Norleen JONETTA Ku, MD;  Location: [facility] OR;  Service: Ophthalmology;  Laterality: Right;   VARICOSE VEIN SURGERY      Social History   Socioeconomic History   Marital status: Married    Spouse name: Maceo    Number of children: 2   Years of education: 12+   Highest education level: Some college, no degree  Occupational History   Occupation: Fish Farm Manager: OTHER  Comment: community   Occupation: SELF EMPLOYED    Employer: SELF EMPLOYED  Tobacco Use   Smoking status: Former    Current packs/day: 0.00    Types: Cigarettes    Quit date: 06/06/2010    Years since quitting: 13.9   Smokeless tobacco: Never  Vaping Use   Vaping status: Never Used  Substance and Sexual Activity   Alcohol use: No    Alcohol/week: 0.0 standard drinks of alcohol   Drug use: No   Sexual activity: Not Currently  Other Topics Concern   Not on file  Social History Narrative   Pt lives at home with his family.   Caffeine Use- 2 cups daily   Patient has 2 children.    Patient has some college.    Patient is right handed.          Works full time   Social Drivers of Corporate Investment Banker  Strain: Low Risk  (07/12/2021)   Overall Financial Resource Strain (CARDIA)    Difficulty of Paying Living Expenses: Not hard at all  Food Insecurity: No Food Insecurity (10/16/2023)   Hunger Vital Sign    Worried About Running Out of Food in the Last Year: Never true    Ran Out of Food in the Last Year: Never true  Transportation Needs: No Transportation Needs (10/16/2023)   PRAPARE - Administrator, Civil Service (Medical): No    Lack of Transportation (Non-Medical): No  Physical Activity: Inactive (07/12/2021)   Exercise Vital Sign    Days of Exercise per Week: 0 days    Minutes of Exercise per Session: 0 min  Stress: No Stress Concern Present (07/12/2021)   [employer] of Occupational Health - Occupational Stress Questionnaire    Feeling of Stress : Not at all  Social Connections: Socially Integrated (10/16/2023)   Social Connection and Isolation Panel    Frequency of Communication with Friends and Family: More than three times a week    Frequency of Social Gatherings with Friends and Family: More than three times a week    Attends Religious Services: More than 4 times per year    Active Member of [employer] or Organizations: Yes    Attends Engineer, Structural: More than 4 times per year    Marital Status: Married  Catering Manager Violence: Not At Risk (10/16/2023)   Humiliation, Afraid, Rape, and Kick questionnaire    Fear of Current or Ex-Partner: No    Emotionally Abused: No    Physically Abused: No    Sexually Abused: No    Family History  Problem Relation Age of Onset   Heart disease Father    Dementia Sister    Diabetes Son    Kidney disease Neg Hx    Prostate cancer Neg Hx     No current facility-administered medications for this visit.  Current Outpatient Medications:    acetaminophen  (TYLENOL ) 500 MG tablet, Take 1,000 mg by mouth every 6 (six) hours as needed for mild pain (pain score 1-3)., Disp: , Rfl:    amiodarone  (PACERONE ) 200 MG tablet,  Take 1 tablet (200 mg total) by mouth daily., Disp: 30 tablet, Rfl: 0   cyanocobalamin  1000 MCG tablet, Take 1 tablet (1,000 mcg total) by mouth daily., Disp: 30 tablet, Rfl: 0   DULoxetine  (CYMBALTA ) 20 MG capsule, Take 1 capsule (20 mg total) by mouth at bedtime., Disp: 90 capsule, Rfl: 1   furosemide  (LASIX ) 40 MG tablet, Take 1 tablet (  40 mg total) by mouth daily., Disp: 90 tablet, Rfl: 0   gabapentin  (NEURONTIN ) 400 MG capsule, Take 1 capsule (400 mg total) by mouth at bedtime., Disp: 90 capsule, Rfl: 0   latanoprost  (XALATAN ) 0.005 % ophthalmic solution, , Disp: , Rfl:    [Paused] lenalidomide  (REVLIMID ) 5 MG capsule, Take 1 capsule (5 mg total) by mouth daily. Take for 21 days, then hold for 7 days. Repeat every 28 days. (Patient not taking: Reported on 04/10/2024), Disp: 21 capsule, Rfl: 0   levothyroxine  (SYNTHROID ) 150 MCG tablet, Take 1 tablet (150 mcg total) by mouth daily before breakfast., Disp: 90 tablet, Rfl: 0   lidocaine  (LIDODERM ) 5 %, Place 1 patch onto the skin daily. Remove & Discard patch within 12 hours or as directed by MD, Disp: 30 patch, Rfl: 0   metoprolol  succinate (TOPROL -XL) 25 MG 24 hr tablet, Take 1 tablet (25 mg total) by mouth 2 (two) times daily., Disp: 180 tablet, Rfl: 1   Multiple Vitamin (MULITIVITAMIN WITH MINERALS) TABS, Take 1 tablet by mouth in the morning., Disp: , Rfl:    ondansetron  (ZOFRAN ) 4 MG tablet, Take 1 tablet (4 mg total) by mouth every 6 (six) hours as needed for nausea., Disp: 20 tablet, Rfl: 0   pantoprazole  (PROTONIX ) 40 MG tablet, Take 1 tablet (40 mg total) by mouth daily., Disp: 90 tablet, Rfl: 1   QUEtiapine  (SEROQUEL ) 25 MG tablet, Take 1 tablet (25 mg total) by mouth at bedtime as needed (sleep, restlessness)., Disp: 90 tablet, Rfl: 1   simvastatin  (ZOCOR ) 40 MG tablet, Take 1 tablet (40 mg total) by mouth daily., Disp: 90 tablet, Rfl: 1   sodium bicarbonate  650 MG tablet, Take 1 tablet (650 mg total) by mouth 3 (three) times daily.,  Disp: , Rfl:    tamsulosin  (FLOMAX ) 0.4 MG CAPS capsule, Take 1 capsule (0.4 mg total) by mouth daily., Disp: 90 capsule, Rfl: 1   traMADol  (ULTRAM ) 50 MG tablet, Take 1 tablet (50 mg total) by mouth every 12 (twelve) hours as needed for moderate pain (pain score 4-6)., Disp: 30 tablet, Rfl: 0   triamcinolone  ointment (KENALOG ) 0.5 %, Apply 1 Application topically 2 (two) times daily., Disp: 30 g, Rfl: 0  Facility-Administered Medications Ordered in Other Visits:    0.9 %  sodium chloride  infusion (Manually program via Guardrails IV Fluids), 250 mL, Intravenous, Continuous, Melanee Annah BROCKS, MD, Last Rate: 10 mL/hr at 05/09/24 1200, 100 mL at 05/09/24 1200   epoetin  alfa-epbx (RETACRIT ) injection 10,000 Units, 10,000 Units, Subcutaneous, Once, Melanee Annah BROCKS, MD   epoetin  alfa-epbx (RETACRIT ) injection 40,000 Units, 40,000 Units, Subcutaneous, Once, Melanee Annah BROCKS, MD   sodium chloride  flush (NS) 0.9 % injection 3 mL, 3 mL, Intravenous, Once, Claudene Rover, MD  Physical exam:  Blood pressure post blood transfusion: 226/113  Physical Exam Constitutional:      Comments: Sitting in a wheelchair.  Appears in no acute distress  Cardiovascular:     Rate and Rhythm: Normal rate and regular rhythm.     Heart sounds: Normal heart sounds.  Pulmonary:     Effort: Pulmonary effort is normal.     Breath sounds: Normal breath sounds.  Abdominal:     General: Bowel sounds are normal.     Palpations: Abdomen is soft.  Skin:    General: Skin is warm and dry.  Neurological:     Mental Status: He is alert and oriented to person, place, and time.      I  have personally reviewed labs listed below:    Latest Ref Rng & Units 05/08/2024    9:36 AM  CMP  Glucose 70 - 99 mg/dL 858   BUN 8 - 23 mg/dL 72   Creatinine 9.38 - 1.24 mg/dL 4.51   Sodium 864 - 854 mmol/L 143   Potassium 3.5 - 5.1 mmol/L 4.9   Chloride 98 - 111 mmol/L 108   CO2 22 - 32 mmol/L 21   Calcium  8.9 - 10.3 mg/dL 8.2   Total Protein  6.5 - 8.1 g/dL 5.8   Total Bilirubin 0.0 - 1.2 mg/dL 0.4   Alkaline Phos 38 - 126 U/L 133   AST 15 - 41 U/L 20   ALT 0 - 44 U/L 25       Latest Ref Rng & Units 05/08/2024    9:36 AM  CBC  WBC 4.0 - 10.5 K/uL 8.8   Hemoglobin 13.0 - 17.0 g/dL 7.0   Hematocrit 60.9 - 52.0 % 22.6   Platelets 150 - 400 K/uL 275      Assessment and plan- Patient is a 83 y.o. 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  Patient endorses blurry vision, headache, dizziness.  He has a history of a stroke in myocardial infarction.  He takes metoprolol  and Lasix  at home in which he reports taking all of his regular medication today.  Due to symptomatic blood pressure he will need to follow-up in the emergency room for hypertensive urgency.   Follow up plan: Sent to ED      Morna Husband AGNP-C [facility] [phone] 05/09/2024 3:04 PM

## 2024-05-09 NOTE — ED Notes (Signed)
 Pt states that they came over here after getting their infusion at the cancer center because their BP was high. Pt states that they took their BP meds this morning and is starting to develop a slight HA in the back of their head. Pt states that they had a fall last week and hit the back of their head but they didn't get it checked out at the time because it wasn't hurting at the time.

## 2024-05-09 NOTE — Discharge Instructions (Addendum)
 Please start taking amlodipine  once daily every day.  This is a blood pressure medication to help reduce your blood pressure to ideally protect your kidneys  Continue all other medications as prescribed  Follow-up with your nephrologist in the clinic.  I did discuss with one of his colleagues today and they said they will try to get you in the clinic next week for follow-up.  You may need to call the clinic to help arrange this.  Return to the ED with any worsening symptoms despite these measures

## 2024-05-09 NOTE — ED Notes (Signed)
 At this time, this EDT and Noelle EDT, helped this pt change soiled brief. Pt was able to stand up @ bedside. This EDT attempted a purewick but was unsuccessful, then asked pt if he knew when he needed to urinate and pt said he does. This EDT gave him new chux, fresh gown, a urinal and encouraged pt to use call light as soon as he needs to urinate. Pt aware and understood, pt is now resting, call light within reach.

## 2024-05-09 NOTE — ED Notes (Signed)
 Pt ABCs intact. RR even and unlabored. Pt in NAD. Bed in lowest locked position. Call bell in reach. Denies needs at this time.

## 2024-05-09 NOTE — Progress Notes (Signed)
 Patient here today for 1 unit PRBC. BP was elevated upon arrival. Patient asymptomatic at that time. MD notified. Per Dr. Melanee, go ahead with blood transfusion and check BP per protocol during and after blood transfusion. Patient's blood pressure remained elevated post transfusion, and patient complained of dizziness and blurred vision. MD Notified. Morna Husband, NP came over and talked to patient, and advised that patient go to the ED. Patient verbalized understanding. Patient transferred to ED via wheelchair and hand off given to triage RN.

## 2024-05-09 NOTE — ED Notes (Signed)
 Called [facility]CMD to add pt to monitoring.

## 2024-05-09 NOTE — ED Provider Notes (Signed)
 [facility] Provider Note    Event Date/Time   First MD Initiated Contact with Patient 05/09/24 1456     (approximate)   History   Hypertension   HPI  [name] is a 83 y.o. male who presents to the ED for evaluation of Hypertension   I review oncology clinic visit from earlier today.  History of myelodysplastic syndrome mostly anemia and intermittently requiring PRBC transfusions.  Otherwise history of HTN, CKD, CAD, CHF.  GFR 13 last month.  Metoprolol , furosemide  only antihypertensives noted.  Patient presents to the ED from local cancer center due to elevated blood pressure in clinic.  Patient reports he has been feeling lightheaded throughout the day today without any falls or syncope but has no other symptoms.  Compliant with all medications.  BP 220s over 110 at oncology clinic earlier today.   Physical Exam   Triage Vital Signs: ED Triage Vitals  Encounter Vitals Group     BP 05/09/24 1453 (!) 207/100     Girls Systolic BP Percentile --      Girls Diastolic BP Percentile --      Boys Systolic BP Percentile --      Boys Diastolic BP Percentile --      Pulse Rate 05/09/24 1453 (!) 58     Resp 05/09/24 1453 18     Temp 05/09/24 1453 (!) 97.5 F (36.4 C)     Temp Source 05/09/24 1453 Oral     SpO2 05/09/24 1453 100 %     Weight --      Height --      Head Circumference --      Peak Flow --      Pain Score 05/09/24 1454 0     Pain Loc --      Pain Education --      Exclude from Growth Chart --     Most recent vital signs: Vitals:   05/09/24 1832 05/09/24 1940  BP:  (!) 162/86  Pulse:  66  Resp:  13  Temp: 98.3 F (36.8 C)   SpO2:  97%    General: Awake, no distress.  CV:  Good peripheral perfusion.  Resp:  Normal effort.  Abd:  No distention.  MSK:  No deformity noted.  Neuro:  No focal deficits appreciated. Cranial nerves II through XII intact 5/5 strength and sensation in all 4 extremities Other:     ED Results /  Procedures / Treatments   Labs (all labs ordered are listed, but only abnormal results are displayed) Labs Reviewed  PROTIME-INR - Abnormal; Notable for the following components:      Result Value   Prothrombin Time 15.3 (*)    All other components within normal limits  APTT - Abnormal; Notable for the following components:   aPTT 55 (*)    All other components within normal limits  CBC - Abnormal; Notable for the following components:   WBC 10.8 (*)    RBC 3.04 (*)    Hemoglobin 9.1 (*)    HCT 29.1 (*)    RDW 18.3 (*)    nRBC 1.2 (*)    All other components within normal limits  DIFFERENTIAL - Abnormal; Notable for the following components:   Abs Immature Granulocytes 1.30 (*)    All other components within normal limits  COMPREHENSIVE METABOLIC PANEL WITH GFR - Abnormal; Notable for the following components:   CO2 20 (*)    Glucose, Bld 141 (*)  BUN 67 (*)    Creatinine, Ser 4.90 (*)    Calcium  8.4 (*)    Total Protein 6.3 (*)    Alkaline Phosphatase 139 (*)    GFR, Estimated 11 (*)    All other components within normal limits  CBG MONITORING, ED - Abnormal; Notable for the following components:   Glucose-Capillary 132 (*)    All other components within normal limits  RESP PANEL BY RT-PCR (RSV, FLU A&B, COVID)  [id number]  ETHANOL  PATHOLOGIST SMEAR REVIEW  CBG MONITORING, ED    EKG Sinus rhythm with a rate of 61 bpm.  Normal axis and intervals.  Nonspecific ST changes to anterior leads.  No STEMI.  Similar morphology as previous EKGs though slightly worsened intensity  RADIOLOGY CT head interpreted by me without evidence of acute intracranial pathology  Official radiology report(s): CT HEAD WO CONTRAST Result Date: 05/09/2024 EXAM: CT HEAD WITHOUT CONTRAST 05/09/2024 03:46:04 PM TECHNIQUE: CT of the head was performed without the administration of intravenous contrast. Automated exposure control, iterative reconstruction, and/or weight based adjustment of the mA/kV  was utilized to reduce the radiation dose to as low as reasonably achievable. COMPARISON: CT of the head dated 06/26/2023. CLINICAL HISTORY: htn dizzy FINDINGS: BRAIN AND VENTRICLES: No acute hemorrhage. No evidence of acute infarct. No hydrocephalus. No extra-axial collection. No mass effect or midline shift. There is age-related volume loss and mild periventricular white matter disease. There is moderate calcification within the carotid siphons. ORBITS: The patient is status post bilateral lens replacement and right-sided scleral banding. SINUSES: No acute abnormality. SOFT TISSUES AND SKULL: No acute soft tissue abnormality. No skull fracture. IMPRESSION: 1. No acute intracranial abnormality. 2. Age-related volume loss and mild periventricular white matter disease. 3. Moderate calcification within the carotid siphons. Electronically signed by: Evalene Coho MD 05/09/2024 03:57 PM EST RP Workstation: [id number]    PROCEDURES and INTERVENTIONS:  .Critical Care  Performed by: Claudene Rover, MD Authorized by: Claudene Rover, MD   Critical care provider statement:    Critical care time (minutes):  30   Critical care time was exclusive of:  Separately billable procedures and treating other patients   Critical care was necessary to treat or prevent imminent or life-threatening deterioration of the following conditions:  Circulatory failure   Critical care was time spent personally by me on the following activities:  Development of treatment plan with patient or surrogate, discussions with consultants, evaluation of patient's response to treatment, examination of patient, ordering and review of laboratory studies, ordering and review of radiographic studies, ordering and performing treatments and interventions, pulse oximetry, re-evaluation of patient's condition and review of old charts .1-3 Lead EKG Interpretation  Performed by: Claudene Rover, MD Authorized by: Claudene Rover, MD     Interpretation:  normal     ECG rate:  70   ECG rate assessment: normal     Rhythm: sinus rhythm     Ectopy: none     Conduction: normal     Medications  sodium chloride  flush (NS) 0.9 % injection 3 mL ( Intravenous Canceled Entry 05/09/24 1457)  amLODipine  (NORVASC ) tablet 5 mg (5 mg Oral Given 05/09/24 1705)  furosemide  (LASIX ) injection 40 mg (40 mg Intravenous Given 05/09/24 1705)  hydrALAZINE  (APRESOLINE ) injection 10 mg (10 mg Intravenous Given 05/09/24 1710)  acetaminophen  (TYLENOL ) tablet 1,000 mg (1,000 mg Oral Given 05/09/24 1821)  hydrALAZINE  (APRESOLINE ) injection 10 mg (10 mg Intravenous Given 05/09/24 1828)     IMPRESSION / MDM / ASSESSMENT AND  PLAN / ED COURSE  I reviewed the triage vital signs and the nursing notes.  Differential diagnosis includes, but is not limited to, hypertensive emergency, ICH, ESRD, hyperkalemia, symptomatic anemia  {Patient presents with symptoms of an acute illness or injury that is potentially life-threatening.  Patient with MDS and CKD presents with presyncope and hypertension.  Neurologically intact without signs of trauma, generally looks well.  CT head benign.  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  I consulted nephrology, as below.  Add amlodipine  to his regimen, IV hydralazine  and Lasix , improving BP, resolution of symptoms.  Considered admission for this patient but believe he be suitable for trial of outpatient management.  Nephrology agrees to follow-up with him early next week.  Discussed ED return precautions.  Clinical Course as of 05/09/24 2348  Thu May 09, 2024  1652 I consult with Dr. Marcelino , nephro. Start amlodipine  PO, give some IV hydralazine , maybe lasix . IF BP improves, symptoms improve, they are happy to manage his renal function as an outpatient. Start amlodipine  5mg  every day as an outpatient as well.  If symptoms or BP cannot be improved then can admit [DS]  1917 Reassessed, patient  reports feeling better, dizziness has resolved, headache is nearly resolved.  We discussed plan of care and he has a desire to go home tonight if feasible.  We discussed improving BP, new medication at home. [DS]  2013 BP continues to improve, he is asymptomatic.  Believe outpatient management would be reasonable. [DS]    Clinical Course User Index [DS] Claudene Rover, MD     FINAL CLINICAL IMPRESSION(S) / ED DIAGNOSES   Final diagnoses:  Uncontrolled hypertension  Dizziness     Rx / DC Orders   ED Discharge Orders          Ordered    amLODipine  (NORVASC ) 5 MG tablet  Daily        05/09/24 1946             Note:  This document was prepared using Dragon voice recognition software and may include unintentional dictation errors.   Claudene Rover, MD 05/09/24 2350

## 2024-05-09 NOTE — ED Triage Notes (Addendum)
 Pt to ED via POV from cancer center. Pt brought over due to dizziness and hypertension. Pt BP PTA was 226/113. Hx of CVA. Pt reports blurry vision and dizziness upon waking at 8am. Pt went to bed at 9pm 12/3 and felt normal. Pt denies weakness, numbness/tingling or speech changes. Pt is on blood thinner. Pt very HOH in triage and forgot hearing aides

## 2024-05-10 ENCOUNTER — Other Ambulatory Visit: Payer: Self-pay

## 2024-05-10 LAB — TYPE AND SCREEN
ABO/RH(D): O POS
Antibody Screen: NEGATIVE
Unit division: 0

## 2024-05-10 LAB — BPAM RBC
Blood Product Expiration Date: 202601042359
ISSUE DATE / TIME: 202512041229
Unit Type and Rh: 202601042359
Unit Type and Rh: 5100

## 2024-05-10 NOTE — Patient Instructions (Addendum)
 Visit Information  Thank you for taking time to visit with me today. Please don't hesitate to contact me if I can be of assistance to you before our next scheduled appointment.  Our next appointment is by telephone on 05/24/24 at 2:00pm Please call the care guide team at [phone] if you need to cancel or reschedule your appointment.   Following is a copy of your care plan:   Goals Addressed             This Visit's Progress    VBCI RN Care Plan   No change    Problems:  Chronic Disease Management support and education needs related to htn  Goal: Over the next 90 days the Patient will continue to work with RN Care Manager and/or Social Worker to address care management and care coordination needs related to HTN as evidenced by adherence to care management team scheduled appointments     demonstrate a decrease hypertensive  in exacerbations as evidenced by normal blood pressures demonstrate understanding of rationale for each prescribed medication as evidenced by verbal understanding     Interventions:   Hypertension Interventions: Last practice recorded BP readings:  BP Readings from Last 3 Encounters:  05/09/24 (!) 162/86  05/09/24 (!) 226/113  05/08/24 (!) 156/85   Most recent eGFR/CrCl:  Lab Results  Component Value Date   EGFR 13 (L) 04/10/2024    No components found for: CRCL  Evaluation of current treatment plan related to hypertension self management and patient's adherence to plan as established by provider Provided education to patient re: stroke prevention, s/s of heart attack and stroke Reviewed medications with patient and discussed importance of compliance Discussed plans with patient for ongoing care management follow up and provided patient with direct contact information for care management team Advised patient, providing education and rationale, to monitor blood pressure daily and record, calling PCP for findings outside established parameters Advised  patient to discuss increased blood pressure  with provider Discussed complications of poorly controlled blood pressure such as heart disease, stroke, circulatory complications, vision complications, kidney impairment, sexual dysfunction  Patient Self-Care Activities:  Attend all scheduled provider appointments Call provider office for new concerns or questions  Take medications as prescribed   Patient will obtain a working BP cuff and document B/P in a log daily  Plan:  Telephone follow up appointment with care management team member scheduled for:  05/24/24 at 2:00             Please call the Suicide and Crisis Lifeline: 988 call the [place]  National Suicide Prevention Lifeline: (318)747-6088 or TTY: 951-870-8091 TTY 667-503-2187) to talk to a trained counselor call 1-800-273-TALK (toll free, 24 hour hotline) if you are experiencing a Mental Health or Behavioral Health Crisis or need someone to talk to.  Wife verbalized understanding of Care plan and visit instructions communicated this visit  Xeng Kucher RN RN Care Manager [facility] [phone]    Visit Information  Thank you for taking time to visit with me today. Please don't hesitate to contact me if I can be of assistance to you before our next scheduled appointment.  Our next appointment is by telephone on 05/24/24 at 2:00pm Please call the care guide team at [phone] if you need to cancel or reschedule your appointment.   Following is a copy of your care plan:   Goals Addressed             This Visit's Progress    VBCI RN  Care Plan   No change    Problems:  Chronic Disease Management support and education needs related to htn  Goal: Over the next 90 days the Patient will continue to work with RN Care Manager and/or Social Worker to address care management and care coordination needs related to HTN as evidenced by adherence to care management team scheduled appointments     demonstrate a  decrease hypertensive  in exacerbations as evidenced by normal blood pressures demonstrate understanding of rationale for each prescribed medication as evidenced by verbal understanding     Interventions:   Hypertension Interventions: Last practice recorded BP readings:  BP Readings from Last 3 Encounters:  05/09/24 (!) 162/86  05/09/24 (!) 226/113  05/08/24 (!) 156/85   Most recent eGFR/CrCl:  Lab Results  Component Value Date   EGFR 13 (L) 04/10/2024    No components found for: CRCL  Evaluation of current treatment plan related to hypertension self management and patient's adherence to plan as established by provider Provided education to patient re: stroke prevention, s/s of heart attack and stroke Reviewed medications with patient and discussed importance of compliance Discussed plans with patient for ongoing care management follow up and provided patient with direct contact information for care management team Advised patient, providing education and rationale, to monitor blood pressure daily and record, calling PCP for findings outside established parameters Advised patient to discuss increased blood pressure  with provider Discussed complications of poorly controlled blood pressure such as heart disease, stroke, circulatory complications, vision complications, kidney impairment, sexual dysfunction  Patient Self-Care Activities:  Attend all scheduled provider appointments Call provider office for new concerns or questions  Take medications as prescribed   Patient will obtain a working BP cuff and document B/P in a log daily  Plan:  Telephone follow up appointment with care management team member scheduled for:  05/24/24 at 2:00             Please call the Suicide and Crisis Lifeline: 988 call the [place]  National Suicide Prevention Lifeline: 505-696-3075 or TTY: 918-670-2066 TTY (717) 082-0128) to talk to a trained counselor call 1-800-273-TALK (toll free, 24 hour  hotline) if you are experiencing a Mental Health or Behavioral Health Crisis or need someone to talk to.  Wife verbalized understanding of Care plan and visit instructions communicated this visit  Tarsha Blando RN RN Care Manager [facility] [phone]

## 2024-05-10 NOTE — Patient Outreach (Signed)
 Complex Care Management   Visit Note  05/10/2024  Name:  [name] MRN: [id number] DOB: [birth year]  Situation: Referral received for Complex Care Management related to Heart Failure and HTN I obtained verbal consent from Caregiver.  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, [facility]   Stroke (HCC) 11/2007   R brain subcortical infarct    Assessment: Patient Reported Symptoms:  Cognitive Cognitive Status: No symptoms reported   Health Maintenance Behaviors: Annual physical exam Healing Pattern: Average  Neurological Neurological Review of Symptoms: Dizziness, Headaches Neurological Management Strategies: Coping strategies, Medication therapy, Routine screening Neurological Self-Management Outcome: 4 (good)  HEENT HEENT Symptoms Reported: Change or loss of hearing HEENT Management Strategies: Medical device HEENT Self-Management Outcome: 4 (good) HEENT Comment: hearinmg aides    Cardiovascular Cardiovascular Symptoms Reported: Dizziness, Lightheadness Does patient have uncontrolled Hypertension?: Yes Is patient checking Blood Pressure at home?: Yes Cardiovascular Management Strategies: Routine screening Cardiovascular Self-Management Outcome: 3 (uncertain)  Respiratory Respiratory Symptoms Reported: No symptoms reported Respiratory Self-Management Outcome: 4 (good)  Endocrine Endocrine  Symptoms Reported: No symptoms reported Is patient diabetic?: No Endocrine Self-Management Outcome: 3 (uncertain)  Gastrointestinal Gastrointestinal Symptoms Reported: No symptoms reported Gastrointestinal Management Strategies: Adequate rest Gastrointestinal Self-Management Outcome: 4 (good)    Genitourinary Genitourinary Symptoms Reported: Frequency Genitourinary Self-Management Outcome: 3 (uncertain) Genitourinary Comment: frequent urination/ burning with urination that began approx a week ago  Integumentary Integumentary Symptoms Reported: Bruising Skin Management Strategies: Adequate rest, Routine screening Skin Self-Management Outcome: 3 (uncertain)  Musculoskeletal Musculoskelatal Symptoms Reviewed: Joint pain Additional Musculoskeletal Details: knee Musculoskeletal Management Strategies: Routine screening Musculoskeletal Self-Management Outcome: 4 (good) Falls in the past year?: Yes Number of falls in past year: 1 or less Was there an injury with Fall?: No Fall Risk Category Calculator: 1 Patient Fall Risk Level: Low Fall Risk Patient at Risk for Falls Due to: History of fall(s) Fall risk Follow up: Falls evaluation completed, Education provided, Falls prevention discussed  Psychosocial Psychosocial Symptoms Reported: No symptoms reported Behavioral Management Strategies: Adequate rest Behavioral Health Self-Management Outcome: 4 (good) Major Change/Loss/Stressor/Fears (CP): Denies Quality of Family Relationships: involved, helpful, supportive Do you feel physically threatened by others?: No    05/10/2024    PHQ2-9 Depression Screening   Little interest or pleasure in doing things Not at all  Feeling down, depressed, or hopeless Not at all  PHQ-2 - Total Score 0  Trouble falling or staying asleep, or sleeping too much    Feeling tired or having little energy    Poor appetite or overeating     Feeling bad about yourself - or that you are a failure or have let yourself or  your family down    Trouble concentrating on things, such as reading the newspaper or watching television    Moving or speaking  so slowly that other people could have noticed.  Or the opposite - being so fidgety or restless that you have been moving around a lot more than usual    Thoughts that you would be better off dead, or hurting yourself in some way    PHQ2-9 Total Score    If you checked off any problems, how difficult have these problems made it for you to do your work, take care of things at home, or get along with other people    Depression Interventions/Treatment      There were no vitals filed for this visit. Pain Scale: 0-10 Pain Score: 0-No pain Multiple Pain Sites: No  Medications Reviewed Today     Reviewed by Nivia, Delvis Kau , RN (Registered Nurse) on 05/10/24 at 1414  Med List Status: <None>   Medication Order Taking? Sig Documenting Provider Last Dose Status Informant  acetaminophen  (TYLENOL ) 500 MG tablet [id number] Yes Take 1,000 mg by mouth every 6 (six) hours as needed for mild pain (pain score 1-3). [provider]  Active Spouse/Significant Other, Pharmacy Records           Med Note NIKKI, MARIA   Mon Oct 16, 2023  3:47 PM) prn  amiodarone  (PACERONE ) 200 MG tablet [id number] Yes Take 1 tablet (200 mg total) by mouth daily. Pegge Toribio PARAS, PA-C  Active Spouse/Significant Other, Pharmacy Records  amLODipine  (NORVASC ) 5 MG tablet [id number] Yes Take 1 tablet (5 mg total) by mouth daily. Claudene Rover, MD  Active   cyanocobalamin  1000 MCG tablet [id number] Yes Take 1 tablet (1,000 mcg total) by mouth daily. Angiulli, Daniel J, PA-C  Active Spouse/Significant Other, Pharmacy Records  DULoxetine  (CYMBALTA ) 20 MG capsule [id number] Yes Take 1 capsule (20 mg total) by mouth at bedtime. Vicci Bouchard P, DO  Active   epoetin  alfa-epbx (RETACRIT ) injection 10,000 Units [id number]   Melanee Annah BROCKS, MD  Active   epoetin  alfa-epbx (RETACRIT ) injection 40,000 Units  [id number]   Melanee Annah BROCKS, MD  Active   furosemide  (LASIX ) 40 MG tablet [id number] Yes Take 1 tablet (40 mg total) by mouth daily. Johnson, Megan P, DO  Active   gabapentin  (NEURONTIN ) 400 MG capsule [id number] Yes Take 1 capsule (400 mg total) by mouth at bedtime. Johnson, Megan P, DO  Active   latanoprost  (XALATAN ) 0.005 % ophthalmic solution [id number] Yes  [provider]  Active   lenalidomide  (REVLIMID ) 5 MG capsule [id number]  Take 1 capsule (5 mg total) by mouth daily. Take for 21 days, then hold for 7 days. Repeat every 28 days.  Patient not taking: Reported on 05/10/2024   Melanee Annah BROCKS, MD  Active Spouse/Significant Other, Pharmacy Records  levothyroxine  (SYNTHROID ) 150 MCG tablet [id number] Yes Take 1 tablet (150 mcg total) by mouth daily before breakfast. Vicci Bouchard P, DO  Active   lidocaine  (LIDODERM ) 5 % [id number] Yes Place 1 patch onto the skin daily. Remove & Discard patch within 12 hours or as directed by MD Angiulli, Toribio PARAS, PA-C  Active Spouse/Significant Other, Pharmacy Records  metoprolol  succinate (TOPROL -XL) 25 MG 24 hr tablet [id number] Yes Take 1 tablet (25 mg total) by mouth 2 (two) times daily. Johnson, Megan P, DO  Active   Multiple Vitamin (MULITIVITAMIN WITH MINERALS) TABS [id number] Yes Take 1 tablet by mouth in the morning. [provider]  Active Spouse/Significant Other, Pharmacy Records           Med Note ZENA, [place] A   Wed Aug 02, 2023  4:49  PM) Centrum Mens  ondansetron  (ZOFRAN ) 4 MG tablet [id number] Yes Take 1 tablet (4 mg total) by mouth every 6 (six) hours as needed for nausea. Alexander, Natalie, DO  Active   pantoprazole  (PROTONIX ) 40 MG tablet [id number] Yes Take 1 tablet (40 mg total) by mouth daily. Johnson, Megan P, DO  Active   QUEtiapine  (SEROQUEL ) 25 MG tablet [id number]  Take 1 tablet (25 mg total) by mouth at bedtime as needed (sleep, restlessness).  Patient not taking: Reported on 05/10/2024   Vicci Duwaine SQUIBB, DO  Active    simvastatin  (ZOCOR ) 40 MG tablet [id number] Yes Take 1 tablet (40 mg total) by mouth daily. Vicci Duwaine P, DO  Active   sodium bicarbonate  650 MG tablet [id number] Yes Take 1 tablet (650 mg total) by mouth 3 (three) times daily. Alexander, Natalie, DO  Active   tamsulosin  (FLOMAX ) 0.4 MG CAPS capsule [id number] Yes Take 1 capsule (0.4 mg total) by mouth daily. Johnson, Megan P, DO  Active   traMADol  (ULTRAM ) 50 MG tablet [id number] Yes Take 1 tablet (50 mg total) by mouth every 12 (twelve) hours as needed for moderate pain (pain score 4-6). Angiulli, Daniel J, PA-C  Active Spouse/Significant Other, Pharmacy Records  triamcinolone  ointment (KENALOG ) 0.5 % [id number] Yes Apply 1 Application topically 2 (two) times daily. Vicci Duwaine SQUIBB, DO  Active   Med List Note Teretha Renaee SAILOR, RPH-CPP 08/30/23 1413): Revlimid  filled at [facility]            Recommendation:   Continue Current Plan of Care Obtain B/P cuff and obtain daily readings  Follow Up Plan:   Telephone follow-up 2 weeks 05/24/24 2:00pm  Ruben Mahler RN RN Care Manager [facility] [phone]

## 2024-05-14 DIAGNOSIS — M1712 Unilateral primary osteoarthritis, left knee: Secondary | ICD-10-CM | POA: Diagnosis not present

## 2024-05-16 ENCOUNTER — Other Ambulatory Visit: Payer: Self-pay

## 2024-05-16 NOTE — Progress Notes (Unsigned)
° °  05/16/2024  Patient ID: [name], male   DOB: [birth year], 83 y.o.   MRN: [id number]  Outreach for scheduled telephone visit for medication review was not successful.  Patient's wife answered and stated he was in a meeting that just started at 3pm.  She suggested I try to call again tomorrow afternoon.  Visit rescheduled to tomorrow at 3pm.  [name], PharmD, DPLA

## 2024-05-17 ENCOUNTER — Other Ambulatory Visit: Payer: Self-pay

## 2024-05-17 ENCOUNTER — Emergency Department

## 2024-05-17 ENCOUNTER — Observation Stay
Admission: EM | Admit: 2024-05-17 | Discharge: 2024-05-22 | Disposition: A | Source: Home / Self Care | Attending: Internal Medicine | Admitting: Internal Medicine

## 2024-05-17 ENCOUNTER — Ambulatory Visit: Payer: Self-pay

## 2024-05-17 DIAGNOSIS — M19039 Primary osteoarthritis, unspecified wrist: Secondary | ICD-10-CM | POA: Diagnosis not present

## 2024-05-17 DIAGNOSIS — Z8673 Personal history of transient ischemic attack (TIA), and cerebral infarction without residual deficits: Secondary | ICD-10-CM

## 2024-05-17 DIAGNOSIS — I482 Chronic atrial fibrillation, unspecified: Secondary | ICD-10-CM | POA: Diagnosis present

## 2024-05-17 DIAGNOSIS — E039 Hypothyroidism, unspecified: Secondary | ICD-10-CM | POA: Diagnosis present

## 2024-05-17 DIAGNOSIS — Z79890 Hormone replacement therapy (postmenopausal): Secondary | ICD-10-CM

## 2024-05-17 DIAGNOSIS — N186 End stage renal disease: Secondary | ICD-10-CM | POA: Diagnosis present

## 2024-05-17 DIAGNOSIS — W19XXXA Unspecified fall, initial encounter: Secondary | ICD-10-CM | POA: Diagnosis not present

## 2024-05-17 DIAGNOSIS — D72829 Elevated white blood cell count, unspecified: Secondary | ICD-10-CM | POA: Diagnosis present

## 2024-05-17 DIAGNOSIS — I2510 Atherosclerotic heart disease of native coronary artery without angina pectoris: Secondary | ICD-10-CM | POA: Diagnosis present

## 2024-05-17 DIAGNOSIS — I132 Hypertensive heart and chronic kidney disease with heart failure and with stage 5 chronic kidney disease, or end stage renal disease: Principal | ICD-10-CM | POA: Diagnosis present

## 2024-05-17 DIAGNOSIS — H538 Other visual disturbances: Secondary | ICD-10-CM | POA: Diagnosis not present

## 2024-05-17 DIAGNOSIS — R001 Bradycardia, unspecified: Secondary | ICD-10-CM | POA: Diagnosis not present

## 2024-05-17 DIAGNOSIS — N184 Chronic kidney disease, stage 4 (severe): Secondary | ICD-10-CM | POA: Diagnosis present

## 2024-05-17 DIAGNOSIS — N179 Acute kidney failure, unspecified: Secondary | ICD-10-CM | POA: Diagnosis not present

## 2024-05-17 DIAGNOSIS — D7581 Myelofibrosis: Secondary | ICD-10-CM | POA: Diagnosis present

## 2024-05-17 DIAGNOSIS — F0390 Unspecified dementia without behavioral disturbance: Secondary | ICD-10-CM | POA: Diagnosis present

## 2024-05-17 DIAGNOSIS — I10 Essential (primary) hypertension: Secondary | ICD-10-CM | POA: Diagnosis not present

## 2024-05-17 DIAGNOSIS — I5032 Chronic diastolic (congestive) heart failure: Secondary | ICD-10-CM | POA: Diagnosis not present

## 2024-05-17 DIAGNOSIS — D631 Anemia in chronic kidney disease: Secondary | ICD-10-CM | POA: Diagnosis present

## 2024-05-17 DIAGNOSIS — Z79899 Other long term (current) drug therapy: Secondary | ICD-10-CM

## 2024-05-17 DIAGNOSIS — E876 Hypokalemia: Secondary | ICD-10-CM | POA: Diagnosis present

## 2024-05-17 DIAGNOSIS — Z96611 Presence of right artificial shoulder joint: Secondary | ICD-10-CM | POA: Diagnosis present

## 2024-05-17 DIAGNOSIS — Z043 Encounter for examination and observation following other accident: Secondary | ICD-10-CM | POA: Diagnosis not present

## 2024-05-17 DIAGNOSIS — I480 Paroxysmal atrial fibrillation: Secondary | ICD-10-CM | POA: Diagnosis present

## 2024-05-17 DIAGNOSIS — I252 Old myocardial infarction: Secondary | ICD-10-CM

## 2024-05-17 DIAGNOSIS — E785 Hyperlipidemia, unspecified: Secondary | ICD-10-CM | POA: Diagnosis present

## 2024-05-17 DIAGNOSIS — N2581 Secondary hyperparathyroidism of renal origin: Secondary | ICD-10-CM | POA: Diagnosis present

## 2024-05-17 DIAGNOSIS — Z888 Allergy status to other drugs, medicaments and biological substances status: Secondary | ICD-10-CM

## 2024-05-17 DIAGNOSIS — N400 Enlarged prostate without lower urinary tract symptoms: Secondary | ICD-10-CM | POA: Diagnosis present

## 2024-05-17 DIAGNOSIS — E875 Hyperkalemia: Secondary | ICD-10-CM | POA: Diagnosis not present

## 2024-05-17 DIAGNOSIS — Y92009 Unspecified place in unspecified non-institutional (private) residence as the place of occurrence of the external cause: Secondary | ICD-10-CM

## 2024-05-17 DIAGNOSIS — Z87891 Personal history of nicotine dependence: Secondary | ICD-10-CM

## 2024-05-17 DIAGNOSIS — D469 Myelodysplastic syndrome, unspecified: Secondary | ICD-10-CM | POA: Diagnosis present

## 2024-05-17 DIAGNOSIS — K219 Gastro-esophageal reflux disease without esophagitis: Secondary | ICD-10-CM | POA: Diagnosis present

## 2024-05-17 DIAGNOSIS — S51812A Laceration without foreign body of left forearm, initial encounter: Secondary | ICD-10-CM | POA: Diagnosis present

## 2024-05-17 DIAGNOSIS — Z85820 Personal history of malignant melanoma of skin: Secondary | ICD-10-CM

## 2024-05-17 DIAGNOSIS — Z8249 Family history of ischemic heart disease and other diseases of the circulatory system: Secondary | ICD-10-CM

## 2024-05-17 DIAGNOSIS — H349 Unspecified retinal vascular occlusion: Secondary | ICD-10-CM | POA: Diagnosis present

## 2024-05-17 DIAGNOSIS — Z8616 Personal history of COVID-19: Secondary | ICD-10-CM

## 2024-05-17 DIAGNOSIS — R296 Repeated falls: Secondary | ICD-10-CM | POA: Diagnosis present

## 2024-05-17 DIAGNOSIS — Z833 Family history of diabetes mellitus: Secondary | ICD-10-CM

## 2024-05-17 DIAGNOSIS — L89152 Pressure ulcer of sacral region, stage 2: Secondary | ICD-10-CM | POA: Diagnosis present

## 2024-05-17 DIAGNOSIS — Z85828 Personal history of other malignant neoplasm of skin: Secondary | ICD-10-CM

## 2024-05-17 LAB — CBC WITH DIFFERENTIAL/PLATELET
Abs Immature Granulocytes: 1.72 K/uL — ABNORMAL HIGH (ref 0.00–0.07)
Basophils Absolute: 0.1 K/uL (ref 0.0–0.1)
Basophils Relative: 1 %
Eosinophils Absolute: 0.2 K/uL (ref 0.0–0.5)
Eosinophils Relative: 1 %
HCT: 30.7 % — ABNORMAL LOW (ref 39.0–52.0)
Hemoglobin: 9.7 g/dL — ABNORMAL LOW (ref 13.0–17.0)
Immature Granulocytes: 12 %
Lymphocytes Relative: 17 %
Lymphs Abs: 2.5 K/uL (ref 0.7–4.0)
MCH: 29.8 pg (ref 26.0–34.0)
MCHC: 31.6 g/dL (ref 30.0–36.0)
MCV: 94.2 fL (ref 80.0–100.0)
Monocytes Absolute: 1.7 K/uL — ABNORMAL HIGH (ref 0.1–1.0)
Monocytes Relative: 12 %
Neutro Abs: 8.5 K/uL — ABNORMAL HIGH (ref 1.7–7.7)
Neutrophils Relative %: 57 %
Platelets: 321 K/uL (ref 150–400)
RBC: 3.26 MIL/uL — ABNORMAL LOW (ref 4.22–5.81)
RDW: 17.6 % — ABNORMAL HIGH (ref 11.5–15.5)
Smear Review: NORMAL
WBC: 14.7 K/uL — ABNORMAL HIGH (ref 4.0–10.5)
nRBC: 2.4 % — ABNORMAL HIGH (ref 0.0–0.2)

## 2024-05-17 LAB — COMPREHENSIVE METABOLIC PANEL WITH GFR
ALT: 21 U/L (ref 0–44)
AST: 25 U/L (ref 15–41)
Albumin: 4.1 g/dL (ref 3.5–5.0)
Alkaline Phosphatase: 142 U/L — ABNORMAL HIGH (ref 38–126)
Anion gap: 13 (ref 5–15)
BUN: 81 mg/dL — ABNORMAL HIGH (ref 8–23)
CO2: 20 mmol/L — ABNORMAL LOW (ref 22–32)
Calcium: 9 mg/dL (ref 8.9–10.3)
Chloride: 104 mmol/L (ref 98–111)
Creatinine, Ser: 5.77 mg/dL — ABNORMAL HIGH (ref 0.61–1.24)
GFR, Estimated: 9 mL/min — ABNORMAL LOW (ref >60)
Glucose, Bld: 96 mg/dL (ref 70–99)
Potassium: 6.1 mmol/L — ABNORMAL HIGH (ref 3.5–5.1)
Sodium: 138 mmol/L (ref 135–145)
Total Bilirubin: 0.5 mg/dL (ref 0.0–1.2)
Total Protein: 6.7 g/dL (ref 6.5–8.1)

## 2024-05-17 LAB — PRO BRAIN NATRIURETIC PEPTIDE: Pro Brain Natriuretic Peptide: 10364 pg/mL — ABNORMAL HIGH (ref <300.0)

## 2024-05-17 LAB — PHOSPHORUS: Phosphorus: 5.3 mg/dL — ABNORMAL HIGH (ref 2.5–4.6)

## 2024-05-17 LAB — POTASSIUM: Potassium: 5.5 mmol/L — ABNORMAL HIGH (ref 3.5–5.1)

## 2024-05-17 LAB — CBG MONITORING, ED: Glucose-Capillary: 78 mg/dL (ref 70–99)

## 2024-05-17 LAB — MAGNESIUM: Magnesium: 2.1 mg/dL (ref 1.7–2.4)

## 2024-05-17 MED ORDER — SODIUM BICARBONATE 8.4 % IV SOLN
50.0000 meq | Freq: Once | INTRAVENOUS | Status: AC
  Administered 2024-05-17: 50 meq via INTRAVENOUS
  Filled 2024-05-17: qty 50

## 2024-05-17 MED ORDER — PANTOPRAZOLE SODIUM 40 MG PO TBEC
40.0000 mg | DELAYED_RELEASE_TABLET | Freq: Every day | ORAL | Status: DC
  Administered 2024-05-18 – 2024-05-22 (×5): 40 mg via ORAL
  Filled 2024-05-17 (×5): qty 1

## 2024-05-17 MED ORDER — LEVOTHYROXINE SODIUM 50 MCG PO TABS
150.0000 ug | ORAL_TABLET | Freq: Every day | ORAL | Status: DC
  Administered 2024-05-18 – 2024-05-22 (×4): 150 ug via ORAL
  Filled 2024-05-17 (×5): qty 3

## 2024-05-17 MED ORDER — SIMVASTATIN 10 MG PO TABS
40.0000 mg | ORAL_TABLET | Freq: Every day | ORAL | Status: DC
  Administered 2024-05-18: 40 mg via ORAL
  Filled 2024-05-17: qty 4

## 2024-05-17 MED ORDER — PATIROMER SORBITEX CALCIUM 8.4 G PO PACK: 16.8000 g | PACK | Freq: Every day | ORAL | Status: DC

## 2024-05-17 MED ORDER — ALBUTEROL SULFATE (2.5 MG/3ML) 0.083% IN NEBU
10.0000 mg | INHALATION_SOLUTION | Freq: Once | RESPIRATORY_TRACT | Status: AC
  Administered 2024-05-17: 10 mg via RESPIRATORY_TRACT
  Filled 2024-05-17: qty 12

## 2024-05-17 MED ORDER — HYDRALAZINE HCL 20 MG/ML IJ SOLN
10.0000 mg | INTRAMUSCULAR | Status: DC | PRN
  Administered 2024-05-18 (×2): 10 mg via INTRAVENOUS
  Filled 2024-05-17 (×2): qty 1

## 2024-05-17 MED ORDER — AMLODIPINE BESYLATE 5 MG PO TABS
5.0000 mg | ORAL_TABLET | Freq: Every day | ORAL | Status: DC
  Administered 2024-05-18 – 2024-05-19 (×2): 5 mg via ORAL
  Filled 2024-05-17 (×2): qty 1

## 2024-05-17 MED ORDER — METOPROLOL SUCCINATE ER 25 MG PO TB24
25.0000 mg | ORAL_TABLET | Freq: Two times a day (BID) | ORAL | Status: DC
  Administered 2024-05-17 – 2024-05-22 (×10): 25 mg via ORAL
  Filled 2024-05-17 (×10): qty 1

## 2024-05-17 MED ORDER — ACETAMINOPHEN 325 MG PO TABS
650.0000 mg | ORAL_TABLET | Freq: Four times a day (QID) | ORAL | Status: DC | PRN
  Administered 2024-05-18 – 2024-05-22 (×4): 650 mg via ORAL
  Filled 2024-05-17 (×3): qty 2

## 2024-05-17 MED ORDER — SODIUM ZIRCONIUM CYCLOSILICATE 10 G PO PACK
10.0000 g | PACK | Freq: Once | ORAL | Status: AC
  Administered 2024-05-17: 10 g via ORAL
  Filled 2024-05-17: qty 1

## 2024-05-17 MED ORDER — ADULT MULTIVITAMIN W/MINERALS CH
1.0000 | ORAL_TABLET | Freq: Every morning | ORAL | Status: DC
  Administered 2024-05-18 – 2024-05-21 (×4): 1 via ORAL
  Filled 2024-05-17 (×5): qty 1

## 2024-05-17 MED ORDER — TRAMADOL HCL 50 MG PO TABS
50.0000 mg | ORAL_TABLET | Freq: Four times a day (QID) | ORAL | Status: DC | PRN
  Administered 2024-05-18: 50 mg via ORAL
  Filled 2024-05-17: qty 1

## 2024-05-17 MED ORDER — DEXTROSE 50 % IV SOLN
1.0000 | Freq: Once | INTRAVENOUS | Status: AC
  Administered 2024-05-17: 50 mL via INTRAVENOUS
  Filled 2024-05-17: qty 50

## 2024-05-17 MED ORDER — ONDANSETRON HCL 4 MG/2ML IJ SOLN
4.0000 mg | Freq: Three times a day (TID) | INTRAMUSCULAR | Status: DC | PRN
  Administered 2024-05-18 – 2024-05-22 (×6): 4 mg via INTRAVENOUS
  Filled 2024-05-17 (×4): qty 2

## 2024-05-17 MED ORDER — DULOXETINE HCL 20 MG PO CPEP
20.0000 mg | ORAL_CAPSULE | Freq: Every day | ORAL | Status: DC
  Administered 2024-05-17 – 2024-05-21 (×5): 20 mg via ORAL
  Filled 2024-05-17 (×6): qty 1

## 2024-05-17 MED ORDER — VITAMIN B-12 1000 MCG PO TABS
1000.0000 ug | ORAL_TABLET | Freq: Every day | ORAL | Status: DC
  Administered 2024-05-18 – 2024-05-22 (×5): 1000 ug via ORAL
  Filled 2024-05-17 (×6): qty 1

## 2024-05-17 MED ORDER — CALCIUM GLUCONATE-NACL 1-0.675 GM/50ML-% IV SOLN
1.0000 g | Freq: Once | INTRAVENOUS | Status: AC
  Administered 2024-05-17: 1000 mg via INTRAVENOUS
  Filled 2024-05-17: qty 50

## 2024-05-17 MED ORDER — INSULIN ASPART 100 UNIT/ML IV SOLN
5.0000 [IU] | Freq: Once | INTRAVENOUS | Status: AC
  Administered 2024-05-17: 5 [IU] via INTRAVENOUS
  Filled 2024-05-17: qty 5

## 2024-05-17 MED ORDER — TAMSULOSIN HCL 0.4 MG PO CAPS
0.4000 mg | ORAL_CAPSULE | Freq: Every day | ORAL | Status: DC
  Administered 2024-05-18 – 2024-05-22 (×5): 0.4 mg via ORAL
  Filled 2024-05-17 (×6): qty 1

## 2024-05-17 MED ORDER — SODIUM CHLORIDE 0.9 % IV BOLUS
1000.0000 mL | Freq: Once | INTRAVENOUS | Status: AC
  Administered 2024-05-17: 1000 mL via INTRAVENOUS

## 2024-05-17 MED ORDER — SODIUM BICARBONATE 650 MG PO TABS
650.0000 mg | ORAL_TABLET | Freq: Three times a day (TID) | ORAL | Status: DC
  Administered 2024-05-17 – 2024-05-22 (×14): 650 mg via ORAL
  Filled 2024-05-17 (×16): qty 1

## 2024-05-17 MED ORDER — AMIODARONE HCL 200 MG PO TABS
200.0000 mg | ORAL_TABLET | Freq: Every day | ORAL | Status: DC
  Administered 2024-05-18 – 2024-05-22 (×5): 200 mg via ORAL
  Filled 2024-05-17 (×5): qty 1

## 2024-05-17 MED ORDER — ENSURE PLUS HIGH PROTEIN PO LIQD
237.0000 mL | Freq: Two times a day (BID) | ORAL | Status: DC
  Administered 2024-05-18 – 2024-05-22 (×6): 237 mL via ORAL

## 2024-05-17 MED ORDER — PATIROMER SORBITEX CALCIUM 8.4 G PO PACK
16.8000 g | PACK | Freq: Once | ORAL | Status: AC
  Administered 2024-05-18: 16.8 g via ORAL
  Filled 2024-05-17: qty 2

## 2024-05-17 NOTE — H&P (Addendum)
 History and Physical    [name] FMW:[id number] DOB: 1941/02/09 DOA: 05/17/2024  Referring MD/NP/PA:   PCP: Vicci Duwaine SQUIBB, DO   Patient coming from:  The patient is coming from home.     Chief Complaint: fall, weakness  HPI: [name] is a 83 y.o. male with medical history significant of CKD-IV, stroke, myelofibrosis, HTN, HLD, CAD, STEMI, dCHF, hypothyroidism, melanoma, mild cognitive impairment, BPH, chronic left eye blurry vision, SDH, A fib not on AC, kidney stone, retinal detachment, pericarditis, who presents with weakness and fall.   Per patient, his wife and son at the bedside, he has generalized weakness recently.  Pt fell while he was walking using cane on Tuesday, causing left forearm skin tear. No LOC.  Patient fell again in the bathroom in the early morning of Wednesday, no LOC or significant injury.  Patient reports chronic neck pain and has been following up with chiropractics with improvement.  Patient has chronic left eye blurry vision which has been going on for more than 1 year. He was told by his ophthalmologist that he had left eye stroke.  No right eye blurry vision.  His wife states patient has chronic bilateral knee problem which is likely the reason for him to fall.  No urinary numbness or tingling in extremities.  No facial droop or slurred speech.  Patient does not have chest pain, cough, SOB.  No nausea, vomiting, diarrhea or abdominal pain.  No symptoms of UTI.  No fever or chills.  Data reviewed independently and ED Course: pt was found to have potassium of 6.1, worsening renal function, negative PCR for COVID, flu and RSV, WBC 14.7.  Temperature normal, blood pressure 188/93, heart rate 57, 75, RR 17, oxygen saturation 98% on room air.  CT of head negative for acute intracranial abnormalities.  Patient is placed on telemetry bed for observation.  Dr. Marcelino of renal is consulted.  CT-head:  1. No CT evidence for acute intracranial abnormality. Atrophy  and chronic small vessel ischemic changes of the white matter. 2. Mild reversal of cervical lordosis with chronic 4 mm anterolisthesis C5 on C6. No acute osseous abnormality. Advanced multilevel degenerative changes. 3. Chronic nondisplaced fracture involving the right posterior arch of C1.   EKG: I have personally reviewed.  Sinus rhythm, QTc 432, heart rate 59, anteroseptal infarction pattern   Review of Systems:   General: no fevers, chills, no body weight gain, has fatigue HEENT: no blurry vision, hearing changes or sore throat Respiratory: no dyspnea, coughing, wheezing CV: no chest pain, no palpitations GI: no nausea, vomiting, abdominal pain, diarrhea, constipation GU: no dysuria, burning on urination, increased urinary frequency, hematuria  Ext: no leg edema Neuro: no unilateral weakness, numbness, or tingling, no hearing loss. Has fall and chronic left eye blurry vision Skin: has skin tear in left forearm MSK: No muscle spasm, no deformity, no limitation of range of movement in spin Heme: No easy bruising.  Travel history: No recent long distant travel.   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   right forearm, [facility]   Stroke (HCC) 11/2007   R brain subcortical infarct    Past Surgical History:  Procedure Laterality Date   ASPIRATION / INJECTION RENAL CYST  07/08/2015   BACK SURGERY     approx 20- 25 years ago   COLONOSCOPY      EYE SURGERY     cataract both eyes   GAS INSERTION  08/11/2011   Procedure: INSERTION OF GAS;  Surgeon: Norleen JONETTA Ku, MD;  Location: [facility] OR;  Service: Ophthalmology;  Laterality: Right;  C3F8   IR BONE MARROW BIOPSY & ASPIRATION  08/03/2023   IR FLUORO GUIDE CV LINE RIGHT  10/23/2023   IR REMOVAL TUN CV CATH W/O FL  11/07/2023   IR THORACENTESIS ASP PLEURAL SPACE W/IMG GUIDE  09/22/2023   IR THORACENTESIS ASP PLEURAL SPACE W/IMG GUIDE  10/02/2023   LEFT HEART CATH AND CORONARY ANGIOGRAPHY N/A 05/02/2023   Procedure: LEFT HEART CATH AND CORONARY ANGIOGRAPHY;  Surgeon: Mady Bruckner, MD;  Location: [facility] INVASIVE CV LAB;  Service: Cardiovascular;  Laterality: N/A;   REVERSE SHOULDER ARTHROPLASTY Right 04/13/2023   Procedure: REVERSE SHOULDER ARTHROPLASTY;  Surgeon: Edie Norleen PARAS, MD;  Location: [facility] ORS;  Service: Orthopedics;  Laterality: Right;   SCLERAL BUCKLE  08/11/2011   Procedure: SCLERAL BUCKLE;  Surgeon: Norleen JONETTA Ku, MD;  Location: [facility] OR;  Service: Ophthalmology;  Laterality: Right;   VARICOSE VEIN SURGERY      Social History:  reports that he quit smoking about 13 years ago. His smoking use included cigarettes. He smoked an average of 0.3 packs per day. He has never used smokeless tobacco. He reports that he does not drink alcohol and does not use drugs.  Family History:  Family History  Problem Relation Age of Onset   Heart disease Father    Dementia Sister    Diabetes Son    Kidney disease Neg Hx    Prostate cancer Neg Hx      Prior to Admission medications  Medication Sig Start Date End Date Taking? Authorizing Provider  finasteride  (PROSCAR ) 5 MG tablet Take 5 mg by mouth daily. 04/29/24 04/29/25 Yes [provider]  midodrine  (PROAMATINE ) 5 MG tablet Take 5 mg by mouth 3 (three) times daily. 03/09/24  Yes [provider]  acetaminophen  (TYLENOL ) 500 MG tablet Take 1,000 mg by mouth every 6 (six) hours as needed for mild pain (pain score 1-3).     [provider]  amiodarone  (PACERONE ) 200 MG tablet Take 1 tablet (200 mg total) by mouth daily. 10/09/23   Angiulli, Toribio PARAS, PA-C  amLODipine  (NORVASC ) 5 MG tablet Take 1 tablet (5 mg total) by mouth daily. 05/09/24 05/09/25  Claudene Rover, MD  cyanocobalamin  1000 MCG tablet Take 1 tablet (1,000 mcg total) by mouth daily. 10/09/23   Angiulli, Toribio PARAS, PA-C  DULoxetine  (CYMBALTA ) 20 MG capsule Take 1 capsule (20 mg total) by mouth at bedtime. 04/14/24   Johnson, Megan P, DO  furosemide  (LASIX ) 40 MG tablet Take 1 tablet (40 mg total) by mouth daily. 04/14/24   Johnson, Megan P, DO  gabapentin  (NEURONTIN ) 400 MG capsule Take 1 capsule (400 mg total) by mouth at bedtime. 04/14/24   Vicci Bouchard P, DO  latanoprost  (XALATAN ) 0.005 % ophthalmic solution  12/26/23   [provider]  [  Paused] lenalidomide  (REVLIMID ) 5 MG capsule Take 1 capsule (5 mg total) by mouth daily. Take for 21 days, then hold for 7 days. Repeat every 28 days. Patient not taking: Reported on 05/10/2024 Wait to take this until your doctor or other care provider tells you to start again. 09/25/23   Melanee Annah BROCKS, MD  levothyroxine  (SYNTHROID ) 150 MCG tablet Take 1 tablet (150 mcg total) by mouth daily before breakfast. 04/14/24   Vicci Bouchard P, DO  lidocaine  (LIDODERM ) 5 % Place 1 patch onto the skin daily. Remove & Discard patch within 12 hours or as directed by MD 10/09/23   Angiulli, Toribio PARAS, PA-C  metoprolol  succinate (TOPROL -XL) 25 MG 24 hr tablet Take 1 tablet (25 mg total) by mouth 2 (two) times daily. 04/14/24   Vicci Bouchard P, DO  Multiple Vitamin (MULITIVITAMIN WITH MINERALS) TABS Take 1 tablet by mouth in the morning.    [provider]  ondansetron  (ZOFRAN ) 4 MG tablet Take 1 tablet (4 mg total) by mouth every 6 (six) hours as needed for nausea. 10/23/23   Alexander, Natalie, DO  pantoprazole  (PROTONIX ) 40 MG tablet Take 1 tablet (40 mg total) by mouth daily. 04/14/24   Johnson, Megan P, DO  QUEtiapine   (SEROQUEL ) 25 MG tablet Take 1 tablet (25 mg total) by mouth at bedtime as needed (sleep, restlessness). Patient not taking: Reported on 05/10/2024 04/14/24   Vicci Bouchard P, DO  simvastatin  (ZOCOR ) 40 MG tablet Take 1 tablet (40 mg total) by mouth daily. 04/14/24   Johnson, Megan P, DO  sodium bicarbonate  650 MG tablet Take 1 tablet (650 mg total) by mouth 3 (three) times daily. 10/23/23   Alexander, Natalie, DO  tamsulosin  (FLOMAX ) 0.4 MG CAPS capsule Take 1 capsule (0.4 mg total) by mouth daily. 04/14/24   Johnson, Megan P, DO  traMADol  (ULTRAM ) 50 MG tablet Take 1 tablet (50 mg total) by mouth every 12 (twelve) hours as needed for moderate pain (pain score 4-6). 10/09/23   Angiulli, Toribio PARAS, PA-C  triamcinolone  ointment (KENALOG ) 0.5 % Apply 1 Application topically 2 (two) times daily. 04/29/24   Vicci Bouchard SQUIBB, DO    Physical Exam: Vitals:   05/17/24 1800 05/17/24 1900 05/17/24 2000 05/17/24 2047  BP: (!) 188/93 (!) 202/91 (!) 193/90 (!) 179/90  Pulse: (!) 57 (!) 58 74 76  Resp: 17 17 15 18   Temp:   (!) 97.4 F (36.3 C) 97.9 F (36.6 C)  TempSrc:   Oral   SpO2: 98% 100% 99% 99%  Weight:    69.3 kg  Height:    5' 11 (1.803 m)   General: Not in acute distress HEENT:       Eyes: PERRL, EOMI, no jaundice       ENT: No discharge from the ears and nose, no pharynx injection, no tonsillar enlargement.        Neck: No JVD, no bruit, no mass felt. Heme: No neck lymph node enlargement. Cardiac: S1/S2, RRR, No murmurs, No gallops or rubs. Respiratory: No rales, wheezing, rhonchi or rubs. GI: Soft, nondistended, nontender, no rebound pain, BS present. GU: No hematuria Ext: No pitting leg edema bilaterally. 1+DP/PT pulse bilaterally. Musculoskeletal: No joint deformities, No joint redness or warmth, no limitation of ROM in spin. Skin: No rashes.  Neuro: Alert, oriented X3, cranial nerves II-XII grossly intact, moves all extremities normally.  Psych: Patient is not psychotic, no suicidal  or hemocidal ideation.  Labs on Admission: I have personally reviewed following labs  and imaging studies  CBC: Recent Labs  Lab 05/17/24 1538  WBC 14.7*  NEUTROABS 8.5*  HGB 9.7*  HCT 30.7*  MCV 94.2  PLT 321   Basic Metabolic Panel: Recent Labs  Lab 05/17/24 1538 05/17/24 1809 05/17/24 2213  NA 138  --   --   K 6.1*  --  5.5*  CL 104  --   --   CO2 20*  --   --   GLUCOSE 96  --   --   BUN 81*  --   --   CREATININE 5.77*  --   --   CALCIUM  9.0  --   --   MG  --  2.1  --   PHOS 5.3*  --   --    GFR: Estimated Creatinine Clearance: 9.5 mL/min (A) (by C-G formula based on SCr of 5.77 mg/dL (H)). Liver Function Tests: Recent Labs  Lab 05/17/24 1538  AST 25  ALT 21  ALKPHOS 142*  BILITOT 0.5  PROT 6.7  ALBUMIN  4.1   No results for input(s): LIPASE, AMYLASE in the last 168 hours. No results for input(s): AMMONIA in the last 168 hours. Coagulation Profile: No results for input(s): INR, PROTIME in the last 168 hours. Cardiac Enzymes: No results for input(s): CKTOTAL, CKMB, CKMBINDEX, TROPONINI in the last 168 hours. BNP (last 3 results) Recent Labs    05/17/24 1538  PROBNP 10,364.0*   HbA1C: No results for input(s): HGBA1C in the last 72 hours. CBG: Recent Labs  Lab 05/17/24 1845  GLUCAP 78   Lipid Profile: No results for input(s): CHOL, HDL, LDLCALC, TRIG, CHOLHDL, LDLDIRECT in the last 72 hours. Thyroid  Function Tests: No results for input(s): TSH, T4TOTAL, FREET4, T3FREE, THYROIDAB in the last 72 hours. Anemia Panel: No results for input(s): VITAMINB12, FOLATE, FERRITIN, TIBC, IRON, RETICCTPCT in the last 72 hours. Urine analysis:    Component Value Date/Time   COLORURINE YELLOW 09/21/2023 1814   APPEARANCEUR Clear 04/10/2024 1158   LABSPEC 1.019 09/21/2023 1814   PHURINE 5.0 09/21/2023 1814   GLUCOSEU Negative 04/10/2024 1158   HGBUR NEGATIVE 09/21/2023 1814   BILIRUBINUR Negative  04/10/2024 1158   KETONESUR NEGATIVE 09/21/2023 1814   PROTEINUR 3+ (A) 04/10/2024 1158   PROTEINUR 100 (A) 09/21/2023 1814   NITRITE Negative 04/10/2024 1158   NITRITE NEGATIVE 09/21/2023 1814   LEUKOCYTESUR Negative 04/10/2024 1158   LEUKOCYTESUR NEGATIVE 09/21/2023 1814   Sepsis Labs: @LABRCNTIP (procalcitonin:4,lacticidven:4) ) Recent Results (from the past 240 hours)  Resp panel by RT-PCR (RSV, Flu A&B, Covid) Anterior Nasal Swab     Status: None   Collection Time: 05/09/24  5:13 PM   Specimen: Anterior Nasal Swab  Result Value Ref Range Status   SARS Coronavirus 2 by RT PCR NEGATIVE NEGATIVE Final    Comment: (NOTE) SARS-CoV-2 target nucleic acids are NOT DETECTED.  The SARS-CoV-2 RNA is generally detectable in upper respiratory specimens during the acute phase of infection. The lowest concentration of SARS-CoV-2 viral copies this assay can detect is 138 copies/mL. A negative result does not preclude SARS-Cov-2 infection and should not be used as the sole basis for treatment or other patient management decisions. A negative result may occur with  improper specimen collection/handling, submission of specimen other than nasopharyngeal swab, presence of viral mutation(s) within the areas targeted by this assay, and inadequate number of viral copies(<138 copies/mL). A negative result must be combined with clinical observations, patient history, and epidemiological information. The expected result is Negative.  Fact Sheet  for Patients:  bloggercourse.com  Fact Sheet for Healthcare Providers:  seriousbroker.it  This test is no t yet approved or cleared by the [place]  FDA and  has been authorized for detection and/or diagnosis of SARS-CoV-2 by FDA under an Emergency Use Authorization (EUA). This EUA will remain  in effect (meaning this test can be used) for the duration of the COVID-19 declaration under Section 564(b)(1)  of the Act, 21 U.S.C.section 360bbb-3(b)(1), unless the authorization is terminated  or revoked sooner.       Influenza A by PCR NEGATIVE NEGATIVE Final   Influenza B by PCR NEGATIVE NEGATIVE Final    Comment: (NOTE) The Xpert Xpress SARS-CoV-2/FLU/RSV plus assay is intended as an aid in the diagnosis of influenza from Nasopharyngeal swab specimens and should not be used as a sole basis for treatment. Nasal washings and aspirates are unacceptable for Xpert Xpress SARS-CoV-2/FLU/RSV testing.  Fact Sheet for Patients: bloggercourse.com  Fact Sheet for Healthcare Providers: seriousbroker.it  This test is not yet approved or cleared by the [place]  FDA and has been authorized for detection and/or diagnosis of SARS-CoV-2 by FDA under an Emergency Use Authorization (EUA). This EUA will remain in effect (meaning this test can be used) for the duration of the COVID-19 declaration under Section 564(b)(1) of the Act, 21 U.S.C. section 360bbb-3(b)(1), unless the authorization is terminated or revoked.     Resp Syncytial Virus by PCR NEGATIVE NEGATIVE Final    Comment: (NOTE) Fact Sheet for Patients: bloggercourse.com  Fact Sheet for Healthcare Providers: seriousbroker.it  This test is not yet approved or cleared by the [place]  FDA and has been authorized for detection and/or diagnosis of SARS-CoV-2 by FDA under an Emergency Use Authorization (EUA). This EUA will remain in effect (meaning this test can be used) for the duration of the COVID-19 declaration under Section 564(b)(1) of the Act, 21 U.S.C. section 360bbb-3(b)(1), unless the authorization is terminated or revoked.  Performed at [facility], [address] [place], [place] [address]      Radiological Exams on Admission:   Assessment/Plan Active Problems:   Hyperkalemia   CKD (chronic kidney  disease) stage 4, GFR 15-29 ml/min (HCC)   Fall at home, initial encounter   Leukocytosis   History of CVA (cerebrovascular accident)   Blurry vision, left eye   Myelofibrosis (HCC)   Atrial fibrillation, chronic (HCC)   HTN (hypertension)   HLD (hyperlipidemia)   Hypothyroidism   Chronic diastolic CHF (congestive heart failure) (HCC)   BPH (benign prostatic hyperplasia)   Assessment and Plan:  Hyperkalemia: K 6.1. is is due to worsening CKD-4.  -Place in telemetry bed follow-up patient - Patient is treated with 1 g of calcium  gluconate, 5 units of NovoLog , D50, 50 mEq of sodium bicarbonate  - 10 g Lokelam -1 L normal saline bolus in the ED - check K level at 22:00 again  Addendum: repeat K is 5.5 -will give Patiromer  16.8 g   CKD (chronic kidney disease) stage 4, GFR 15-29 ml/min (HCC): renal function is worsening.  Baseline creatinine 2.9-4.9 recently.  He is creatinine is 5.77, BUN 81, GFR 9 today. - 1 L normal saline bolus in ED -Hold Lasix  - Continue home sodium bicarbonate  - Consulted Dr. Marcelino of renal -f/u UA   Chronic A-fib: Heart rate 50-70s.  Patient is not taking anticoagulants.  Patient has history of SDH. - Continue metoprolol  and amiodarone   Fall at home, initial encounter: No acute injuries by images.  CT head negative.  X-ray of left forearm negative for fracture.  CT of C-spine showed chronic C1 arch fracture. - Fall precaution - PT/OT  C1 arch fracture: CT showed chronic nondisplaced fracture involving the right posterior arch of C1. Pt is following up with chiropractics with improvement of his neck pain - Continue home as needed tramadol  - As needed Tylenol   Leukocytosis: WBC 14.7, no fever, no source of infection identified so far.  Likely reactive -Follow-up repeat CBC - Follow-up UA  History of CVA (cerebrovascular accident) -Zocor   Blurry vision, left eye: This is chronic issue.  Per patient, patient was told to have left eye stroke. -  Observe closely  History of myelofibrosis [facility]): Patient is currently not taking lenalidomide .  Hemoglobin stable at 9.7 (9.1 on 05/09/24) -Follow-up with Dr. Melanee of oncology  HTN (hypertension) -IV hydralazine  as needed -Amlodipine , metoprolol   HLD (hyperlipidemia) -Zocor   Hypothyroidism -Synthroid   Chronic diastolic CHF (congestive heart failure) (HCC): Today: 07/27/2023 showed EF of 50-55% with grade 1 diastolic dysfunction.  Patient does not have leg edema JVD.  CHF seem to be compensated. -Hold Lasix  due to worsening renal function - Check BNP  BPH (benign prostatic hyperplasia) - Flomax       DVT ppx: SCD  Code Status: Full code   Family Communication:  Yes, patient's wife and son   at bed side.      Disposition Plan:  Anticipate discharge back to previous environment  Consults called:  Dr. Marcelino of renal  Admission status and Level of care: Telemetry:    for obs    Dispo: The patient is from: Home              Anticipated d/c is to: Home              Anticipated d/c date is: 1 day              Patient currently is not medically stable to d/c.    Severity of Illness:  The appropriate patient status for this patient is OBSERVATION. Observation status is judged to be reasonable and necessary in order to provide the required intensity of service to ensure the patient's safety. The patient's presenting symptoms, physical exam findings, and initial radiographic and laboratory data in the context of their medical condition is felt to place them at decreased risk for further clinical deterioration. Furthermore, it is anticipated that the patient will be medically stable for discharge from the hospital within 2 midnights of admission.        Date of Service 05/17/2024    Caleb Exon [facility]   If 7PM-7AM, please contact night-coverage [email or web address] 05/17/2024, 11:36 PM     [1]  Allergies Allergen Reactions   Quinolones Other (See Comments)     Patient with aortic aneurysm. Use of quinolones are considered to be contraindicated due to risk of aortic rupture.   Meloxicam Nausea And Vomiting

## 2024-05-17 NOTE — ED Provider Notes (Signed)
 [facility] Provider Note    Event Date/Time   First MD Initiated Contact with Patient 05/17/24 1647     (approximate)   History   Chief Complaint Fall   HPI  [name] is a 83 y.o. male with past medical history of hypertension, CAD, CHF, atrial fibrillation, CKD, myelodysplastic syndrome, and dementia who presents to the ED complaining of fall.  Per EMS, patient had a fall 3 days ago onto his left arm, but it is unclear whether he hit his head or lost consciousness.  Wife contacted the nurse triage line today after patient developed bilateral blurry vision with generalized weakness and difficulty walking.  She was advised to have him brought to the ED for further evaluation.  Patient is not sure why he is here, complains of pain to his left arm but denies other complaints.     Physical Exam   Triage Vital Signs: ED Triage Vitals  Encounter Vitals Group     BP 05/17/24 1535 (!) 173/97     Girls Systolic BP Percentile --      Girls Diastolic BP Percentile --      Boys Systolic BP Percentile --      Boys Diastolic BP Percentile --      Pulse Rate 05/17/24 1535 75     Resp 05/17/24 1535 16     Temp 05/17/24 1535 98 F (36.7 C)     Temp src --      SpO2 05/17/24 1535 96 %     Weight 05/17/24 1536 163 lb 12.8 oz (74.3 kg)     Height 05/17/24 1536 5' 11 (1.803 m)     Head Circumference --      Peak Flow --      Pain Score --      Pain Loc --      Pain Education --      Exclude from Growth Chart --     Most recent vital signs: Vitals:   05/17/24 2000 05/17/24 2047  BP: (!) 193/90 (!) 179/90  Pulse: 74 76  Resp: 15 18  Temp: (!) 97.4 F (36.3 C) 97.9 F (36.6 C)  SpO2: 99% 99%    Constitutional: Alert and oriented to person, but not place, time, or situation. Eyes: Conjunctivae are normal. Head: Atraumatic. Nose: No congestion/rhinnorhea. Mouth/Throat: Mucous membranes are moist.  Neck: No midline cervical spine tenderness to  palpation. Cardiovascular: Normal rate, regular rhythm. Grossly normal heart sounds.  2+ radial pulses bilaterally. Respiratory: Normal respiratory effort.  No retractions. Lungs CTAB.  No chest wall tenderness to palpation. Gastrointestinal: Soft and nontender. No distention. Musculoskeletal: No lower extremity tenderness nor edema.  Skin tear to left forearm with no bony tenderness.  No right upper extremity bony tenderness. Neurologic:  Normal speech and language. No gross focal neurologic deficits are appreciated.    ED Results / Procedures / Treatments   Labs (all labs ordered are listed, but only abnormal results are displayed) Labs Reviewed  CBC WITH DIFFERENTIAL/PLATELET - Abnormal; Notable for the following components:      Result Value   WBC 14.7 (*)    RBC 3.26 (*)    Hemoglobin 9.7 (*)    HCT 30.7 (*)    RDW 17.6 (*)    nRBC 2.4 (*)    Neutro Abs 8.5 (*)    Monocytes Absolute 1.7 (*)    Abs Immature Granulocytes 1.72 (*)    All other components within normal limits  COMPREHENSIVE METABOLIC PANEL WITH GFR - Abnormal; Notable for the following components:   Potassium 6.1 (*)    CO2 20 (*)    BUN 81 (*)    Creatinine, Ser 5.77 (*)    Alkaline Phosphatase 142 (*)    GFR, Estimated 9 (*)    All other components within normal limits  PHOSPHORUS - Abnormal; Notable for the following components:   Phosphorus 5.3 (*)    All other components within normal limits  PRO BRAIN NATRIURETIC PEPTIDE - Abnormal; Notable for the following components:   Pro Brain Natriuretic Peptide 10,364.0 (*)    All other components within normal limits  POTASSIUM - Abnormal; Notable for the following components:   Potassium 5.5 (*)    All other components within normal limits  MAGNESIUM   URINALYSIS, ROUTINE W REFLEX MICROSCOPIC  BASIC METABOLIC PANEL WITH GFR  CBC  CBG MONITORING, ED     EKG  ED ECG REPORT I, Carlin Palin, the attending physician, personally viewed and interpreted  this ECG.   Date: 05/17/2024  EKG Time: 16:59  Rate: 59  Rhythm: sinus bradycardia  Axis: Normal  Intervals:first-degree A-V block   ST&T Change: Nonspecific T wave abnormality  RADIOLOGY CT head reviewed and interpreted by me with no hemorrhage or midline shift.  PROCEDURES:  Critical Care performed: No  .Critical Care  Performed by: Palin Carlin, MD Authorized by: Palin Carlin, MD   Critical care provider statement:    Critical care time (minutes):  30   Critical care time was exclusive of:  Separately billable procedures and treating other patients and teaching time   Critical care was necessary to treat or prevent imminent or life-threatening deterioration of the following conditions:  Renal failure   Critical care was time spent personally by me on the following activities:  Development of treatment plan with patient or surrogate, discussions with consultants, evaluation of patient's response to treatment, examination of patient, ordering and review of laboratory studies, ordering and review of radiographic studies, ordering and performing treatments and interventions, pulse oximetry, re-evaluation of patient's condition and review of old charts   I assumed direction of critical care for this patient from another provider in my specialty: no     Care discussed with: admitting provider      MEDICATIONS ORDERED IN ED: Medications  ondansetron  (ZOFRAN ) injection 4 mg (has no administration in time range)  hydrALAZINE  (APRESOLINE ) injection 10 mg (has no administration in time range)  acetaminophen  (TYLENOL ) tablet 650 mg (has no administration in time range)  amiodarone  (PACERONE ) tablet 200 mg (has no administration in time range)  amLODipine  (NORVASC ) tablet 5 mg (has no administration in time range)  metoprolol  succinate (TOPROL -XL) 24 hr tablet 25 mg (25 mg Oral Given 05/17/24 2300)  simvastatin  (ZOCOR ) tablet 40 mg (has no administration in time range)  DULoxetine   (CYMBALTA ) DR capsule 20 mg (20 mg Oral Given 05/17/24 2300)  levothyroxine  (SYNTHROID ) tablet 150 mcg (has no administration in time range)  pantoprazole  (PROTONIX ) EC tablet 40 mg (has no administration in time range)  sodium bicarbonate  tablet 650 mg (650 mg Oral Given 05/17/24 2300)  tamsulosin  (FLOMAX ) capsule 0.4 mg (has no administration in time range)  cyanocobalamin  (VITAMIN B12) tablet 1,000 mcg (has no administration in time range)  multivitamin with minerals tablet 1 tablet (has no administration in time range)  traMADol  (ULTRAM ) tablet 50 mg (has no administration in time range)  feeding supplement (ENSURE PLUS HIGH PROTEIN) liquid 237 mL (has no administration in  time range)  sodium chloride  0.9 % bolus 1,000 mL (1,000 mLs Intravenous New Bag/Given 05/17/24 1837)  insulin  aspart (novoLOG ) injection 5 Units (5 Units Intravenous Given 05/17/24 1846)    And  dextrose  50 % solution 50 mL (50 mLs Intravenous Given 05/17/24 1850)  sodium zirconium cyclosilicate  (LOKELMA ) packet 10 g (10 g Oral Given 05/17/24 1837)  albuterol  (PROVENTIL ) (2.5 MG/3ML) 0.083% nebulizer solution 10 mg (10 mg Nebulization Given 05/17/24 1842)  calcium  gluconate 1 g/ 50 mL sodium chloride  IVPB (0 mg Intravenous Stopped 05/17/24 1912)  sodium bicarbonate  injection 50 mEq (50 mEq Intravenous Given 05/17/24 1939)     IMPRESSION / MDM / ASSESSMENT AND PLAN / ED COURSE  I reviewed the triage vital signs and the nursing notes.                              83 y.o. male with past medical history of hypertension, CAD, CHF, atrial fibrillation, CKD, myelodysplastic syndrome, and dementia who presents to the ED following fall 2 to 3 days ago with generalized weakness since then.  Patient's presentation is most consistent with acute presentation with potential threat to life or bodily function.  Differential diagnosis includes, but is not limited to, intracranial injury, cervical spine injury, UTI, fracture,  contusion, anemia, electrolyte abnormality, AKI.  Patient nontoxic-appearing and in no acute distress, vital signs are unremarkable.  History regarding his fall is very limited as I was unable to reach either wife or son via phone.  We will check CT of head and cervical spine as well as x-rays of left forearm.  EKG shows no evidence of arrhythmia or ischemia, labs show AKI on top of advanced chronic kidney disease with associated hyperkalemia.  No EKG changes noted and we will treat with Lokelma , DuoNeb, and insulin .  Patient with mild leukocytosis without obvious signs of infection at this time, anemia is stable compared to previous.  LFTs are unremarkable.  CT head and cervical spine are negative for acute process, x-ray of left forearm is also unremarkable.  Case discussed with hospitalist for admission due to generalized weakness with AKI and hyperkalemia.      FINAL CLINICAL IMPRESSION(S) / ED DIAGNOSES   Final diagnoses:  Fall, initial encounter  AKI (acute kidney injury)  Hyperkalemia     Rx / DC Orders   ED Discharge Orders     None        Note:  This document was prepared using Dragon voice recognition software and may include unintentional dictation errors.   Willo Dunnings, MD 05/17/24 [phone]

## 2024-05-17 NOTE — ED Notes (Signed)
 CCMD notified of cardiac monitoring order.

## 2024-05-17 NOTE — ED Notes (Signed)
 ED Provider at bedside.

## 2024-05-17 NOTE — ED Triage Notes (Signed)
 Arrived by [facility] from home. Family reports fall on Tuesday. Skin tears present on left arm. Patient c/o bilateral blurry vision   Family reports dementia, hypertension  EMS vitals: 200/112 b/p 80sHR

## 2024-05-17 NOTE — ED Notes (Signed)
 ABD dressing placed with coban to L forearm skin tears/bleeding. Will continue to monitor.

## 2024-05-17 NOTE — ED Notes (Signed)
 Patient to radiology via stretcher

## 2024-05-17 NOTE — Telephone Encounter (Signed)
 FYI Only or Action Required?: FYI only for provider: ED advised.  Patient was last seen in primary care on 04/29/2024 by Vicci Duwaine SQUIBB, DO.  Called Nurse Triage reporting Fall.  Symptoms began several days ago.  Interventions attempted: Other: application of bandages to wounded arm.  Symptoms are: unchanged.  Triage Disposition: Call EMS 911 Now  Patient/caregiver understands and will follow disposition?: Yes                                  1. MECHANISM: How did the fall happen?     Wife is unsure, wife states the first fall happened while he was leaving a restaurant on Tuesday and the second fall happed while he was sitting on the toilet Wednesday evening, wife states she thinks he bent over to pick-up his diaper and fell over 3. ONSET: When did the fall happen? (e.g., minutes, hours, or days ago)     Tuesday and Wednesday night 4. LOCATION: What part of the body hit the ground? (e.g., back, buttocks, head, hips, knees, hands, head, stomach)     Wife is unsure, wife states she does not think her husband hit his head, but states he has been complaining of a headache 5. INJURY: Did you hurt (injure) yourself when you fell? If Yes, ask: What did you injure? Tell me more about this? (e.g., body area; type of injury; pain severity)     Wife reports headache and arm (unsure of laterality) being scraped-up 7. SIZE: For cuts, bruises, or swelling, ask: How large is it? (e.g., inches or centimeters)      Wife reports open wound covering half of patient's arm (unsure of laterality)  9. OTHER SYMPTOMS: Do you have any other symptoms? (e.g., dizziness, fever, weakness; new-onset or worsening).      Wife reports patient has not been able to walk since the fall, wife states that patient is typically able to ambulate using his walker, wife reports patient is asleep in bed right now Wife denies loss of consciousness, wife denies new onset of  weakness 10. CAUSE: What do you think caused the fall (or falling)? (e.g., dizzy spell, tripped)     Wife is unsure, assumes dizzy spell     This RN spoke to patient's wife, Maceo (on [place]). This RN advised ED via EMS. This RN called EMS for patient. EMS has been dispatched.   Copied from CRM [id number]. Topic: Clinical - Red Word Triage >> May 17, 2024  2:00 PM Tiffini S wrote: [facility] that prompted transfer to Nurse Triage: Patient fell the night before on 05/15/24 in the bathroom- he headache and arms are bleeding all over the floors- will not stop bleeding  Reason for Disposition  Sounds like a life-threatening emergency to the triager  Protocols used: Falls and [facility]

## 2024-05-17 NOTE — Progress Notes (Unsigned)
° °  05/17/2024  Patient ID: [name], male   DOB: [birth year], 83 y.o.   MRN: [id number]  Patient outreach for telephone visit to review medications and discuss adherence.  I was able to reach patient's wife, and she states patient was actually just picked up by ambulance to go to the hospital for evaluation.  She states he had fallen twice in the past week and has been having a heard time getting bleeding to stop.  Advised by PCP office to be evaluated at the hospital.  It appears patient was on Plavix , but this was stopped over a year ago.  He was also on Eliquis  5mg  BID, but this has not been filled since a 90 day supply in January of this year.  I will try to follow-up to check on patient in another week or two.  Channing DELENA Mealing, PharmD, DPLA

## 2024-05-18 DIAGNOSIS — F0390 Unspecified dementia without behavioral disturbance: Secondary | ICD-10-CM | POA: Diagnosis present

## 2024-05-18 DIAGNOSIS — R296 Repeated falls: Secondary | ICD-10-CM | POA: Diagnosis present

## 2024-05-18 DIAGNOSIS — I132 Hypertensive heart and chronic kidney disease with heart failure and with stage 5 chronic kidney disease, or end stage renal disease: Secondary | ICD-10-CM | POA: Diagnosis present

## 2024-05-18 DIAGNOSIS — D7581 Myelofibrosis: Secondary | ICD-10-CM | POA: Diagnosis present

## 2024-05-18 DIAGNOSIS — N2581 Secondary hyperparathyroidism of renal origin: Secondary | ICD-10-CM | POA: Diagnosis present

## 2024-05-18 DIAGNOSIS — N186 End stage renal disease: Secondary | ICD-10-CM | POA: Diagnosis present

## 2024-05-18 DIAGNOSIS — Z8249 Family history of ischemic heart disease and other diseases of the circulatory system: Secondary | ICD-10-CM | POA: Diagnosis not present

## 2024-05-18 DIAGNOSIS — R918 Other nonspecific abnormal finding of lung field: Secondary | ICD-10-CM | POA: Diagnosis not present

## 2024-05-18 DIAGNOSIS — I482 Chronic atrial fibrillation, unspecified: Secondary | ICD-10-CM | POA: Diagnosis present

## 2024-05-18 DIAGNOSIS — E876 Hypokalemia: Secondary | ICD-10-CM | POA: Diagnosis present

## 2024-05-18 DIAGNOSIS — N185 Chronic kidney disease, stage 5: Secondary | ICD-10-CM | POA: Diagnosis not present

## 2024-05-18 DIAGNOSIS — Z96611 Presence of right artificial shoulder joint: Secondary | ICD-10-CM | POA: Diagnosis not present

## 2024-05-18 DIAGNOSIS — N400 Enlarged prostate without lower urinary tract symptoms: Secondary | ICD-10-CM | POA: Diagnosis present

## 2024-05-18 DIAGNOSIS — W19XXXA Unspecified fall, initial encounter: Secondary | ICD-10-CM | POA: Diagnosis present

## 2024-05-18 DIAGNOSIS — I2510 Atherosclerotic heart disease of native coronary artery without angina pectoris: Secondary | ICD-10-CM | POA: Diagnosis present

## 2024-05-18 DIAGNOSIS — J90 Pleural effusion, not elsewhere classified: Secondary | ICD-10-CM | POA: Diagnosis not present

## 2024-05-18 DIAGNOSIS — L8990 Pressure ulcer of unspecified site, unspecified stage: Secondary | ICD-10-CM | POA: Insufficient documentation

## 2024-05-18 DIAGNOSIS — Z85820 Personal history of malignant melanoma of skin: Secondary | ICD-10-CM | POA: Diagnosis not present

## 2024-05-18 DIAGNOSIS — Z8616 Personal history of COVID-19: Secondary | ICD-10-CM | POA: Diagnosis not present

## 2024-05-18 DIAGNOSIS — E785 Hyperlipidemia, unspecified: Secondary | ICD-10-CM | POA: Diagnosis present

## 2024-05-18 DIAGNOSIS — Z833 Family history of diabetes mellitus: Secondary | ICD-10-CM | POA: Diagnosis not present

## 2024-05-18 DIAGNOSIS — D631 Anemia in chronic kidney disease: Secondary | ICD-10-CM | POA: Diagnosis present

## 2024-05-18 DIAGNOSIS — H349 Unspecified retinal vascular occlusion: Secondary | ICD-10-CM | POA: Diagnosis present

## 2024-05-18 DIAGNOSIS — I5032 Chronic diastolic (congestive) heart failure: Secondary | ICD-10-CM | POA: Diagnosis present

## 2024-05-18 DIAGNOSIS — E039 Hypothyroidism, unspecified: Secondary | ICD-10-CM | POA: Diagnosis present

## 2024-05-18 DIAGNOSIS — L89152 Pressure ulcer of sacral region, stage 2: Secondary | ICD-10-CM | POA: Diagnosis present

## 2024-05-18 DIAGNOSIS — Y92009 Unspecified place in unspecified non-institutional (private) residence as the place of occurrence of the external cause: Secondary | ICD-10-CM | POA: Diagnosis not present

## 2024-05-18 DIAGNOSIS — I480 Paroxysmal atrial fibrillation: Secondary | ICD-10-CM | POA: Diagnosis present

## 2024-05-18 DIAGNOSIS — Z79890 Hormone replacement therapy (postmenopausal): Secondary | ICD-10-CM | POA: Diagnosis not present

## 2024-05-18 DIAGNOSIS — E875 Hyperkalemia: Secondary | ICD-10-CM | POA: Diagnosis present

## 2024-05-18 DIAGNOSIS — D469 Myelodysplastic syndrome, unspecified: Secondary | ICD-10-CM | POA: Diagnosis present

## 2024-05-18 LAB — BASIC METABOLIC PANEL WITH GFR
Anion gap: 13 (ref 5–15)
BUN: 75 mg/dL — ABNORMAL HIGH (ref 8–23)
CO2: 21 mmol/L — ABNORMAL LOW (ref 22–32)
Calcium: 8.7 mg/dL — ABNORMAL LOW (ref 8.9–10.3)
Chloride: 105 mmol/L (ref 98–111)
Creatinine, Ser: 5.2 mg/dL — ABNORMAL HIGH (ref 0.61–1.24)
GFR, Estimated: 10 mL/min — ABNORMAL LOW (ref >60)
Glucose, Bld: 92 mg/dL (ref 70–99)
Potassium: 5.3 mmol/L — ABNORMAL HIGH (ref 3.5–5.1)
Sodium: 139 mmol/L (ref 135–145)

## 2024-05-18 LAB — CBC
HCT: 26.4 % — ABNORMAL LOW (ref 39.0–52.0)
Hemoglobin: 8.7 g/dL — ABNORMAL LOW (ref 13.0–17.0)
MCH: 30.2 pg (ref 26.0–34.0)
MCHC: 33 g/dL (ref 30.0–36.0)
MCV: 91.7 fL (ref 80.0–100.0)
Platelets: 299 K/uL (ref 150–400)
RBC: 2.88 MIL/uL — ABNORMAL LOW (ref 4.22–5.81)
RDW: 17.4 % — ABNORMAL HIGH (ref 11.5–15.5)
WBC: 14 K/uL — ABNORMAL HIGH (ref 4.0–10.5)
nRBC: 2.9 % — ABNORMAL HIGH (ref 0.0–0.2)

## 2024-05-18 LAB — URINALYSIS, ROUTINE W REFLEX MICROSCOPIC
Bacteria, UA: NONE SEEN
Bilirubin Urine: NEGATIVE
Glucose, UA: NEGATIVE mg/dL
Hgb urine dipstick: NEGATIVE
Ketones, ur: NEGATIVE mg/dL
Leukocytes,Ua: NEGATIVE
Nitrite: NEGATIVE
Protein, ur: >=300 mg/dL — AB
Specific Gravity, Urine: 1.016 (ref 1.005–1.030)
Squamous Epithelial / HPF: 0 /HPF (ref 0–5)
pH: 6 (ref 5.0–8.0)

## 2024-05-18 LAB — MRSA NEXT GEN BY PCR, NASAL: MRSA by PCR Next Gen: NOT DETECTED

## 2024-05-18 MED ORDER — SODIUM CHLORIDE 0.9 % IV SOLN
12.5000 mg | Freq: Four times a day (QID) | INTRAVENOUS | Status: DC | PRN
  Administered 2024-05-18: 12.5 mg via INTRAVENOUS
  Filled 2024-05-18: qty 12.5

## 2024-05-18 MED ORDER — TRAZODONE HCL 50 MG PO TABS
25.0000 mg | ORAL_TABLET | Freq: Every evening | ORAL | Status: DC | PRN
  Administered 2024-05-18: 25 mg via ORAL
  Filled 2024-05-18: qty 1

## 2024-05-18 MED ORDER — SIMVASTATIN 20 MG PO TABS
20.0000 mg | ORAL_TABLET | Freq: Every day | ORAL | Status: DC
  Administered 2024-05-19 – 2024-05-22 (×4): 20 mg via ORAL
  Filled 2024-05-18 (×5): qty 1

## 2024-05-18 MED ORDER — MORPHINE SULFATE (PF) 2 MG/ML IV SOLN
2.0000 mg | INTRAVENOUS | Status: DC | PRN
  Administered 2024-05-18 (×2): 2 mg via INTRAVENOUS
  Filled 2024-05-18 (×2): qty 1

## 2024-05-18 NOTE — Evaluation (Signed)
 Occupational Therapy Evaluation Patient Details Name: [name] MRN: [id number] DOB: 1941-05-12 Today's Date: 05/18/2024   History of Present Illness   Pt admitted to [facility] on 05/17/24 under observation for c/o multiple falls and blurred vision. 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 Significant PMH includes: CKD-IV, stroke, myelofibrosis, HTN, HLD, CAD, STEMI, dCHF, hypothyroidism, melanoma, mild cognitive impairment, BPH, chronic left eye blurry vision, SDH, A fib not on AC, kidney stone, retinal detachment, pericarditis.     Clinical Impressions Pt seen for OT evaluation this date, no family present during session. Pt lives at home with his wife and son.  He ambulates with a cane and uses a rollator at home.  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   Pt is hard of hearing, he presents with muscle weakness, decreased functional transfers, mobility, balance and decreased ability to perform basic self care tasks.  Pt demonstrates posterior lean with sitting balance and requires assistance and cues.  Pt requires min to moderate assist with self care tasks.  Pt reports 2-3 falls in the last 6 months.  Pt would benefit from skilled OT services to maximize safety and independence in necessary daily tasks and to reduce caregiver burden.       If plan is discharge home, recommend the following:   A lot of help with walking and/or transfers;A lot of help with bathing/dressing/bathroom;Assist for transportation;Assistance with cooking/housework     Functional Status Assessment   Patient has had a recent decline in their functional status and demonstrates the ability to make significant improvements in function in a reasonable and predictable amount of time.     Equipment Recommendations         Recommendations for Other Services          Precautions/Restrictions   Precautions Precautions: Fall Precaution/Restrictions Comments: dementia, L eye blurred vision Restrictions Weight Bearing Restrictions Per Provider Order: No     Mobility Bed Mobility Overal bed mobility: Needs Assistance Bed Mobility: Supine to Sit, Sit to Supine     Supine to sit: Mod assist Sit to supine: Mod assist        Transfers Overall transfer level: Needs assistance   Transfers: Sit to/from Stand Sit to Stand: Mod assist                  Balance Overall balance assessment: Needs assistance Sitting-balance support: Feet supported, No upper extremity supported Sitting balance-Leahy Scale: Fair   Postural control: Posterior lean Standing balance support: During functional activity, Single extremity supported Standing balance-Leahy Scale: Poor                             ADL either performed or assessed with clinical judgement   ADL Overall ADL's : Needs assistance/impaired Eating/Feeding: Modified independent   Grooming: Set up;Bed level   Upper Body Bathing: Minimal assistance   Lower Body Bathing: Moderate assistance   Upper Body Dressing : Set up   Lower Body Dressing: Minimal assistance   Toilet Transfer: Moderate assistance   Toileting- Clothing Manipulation and Hygiene: Moderate assistance               Vision Baseline Vision/History: 1 Wears glasses       Perception         Praxis         Pertinent Vitals/Pain Pain  Assessment Pain Score: 5  Pain Location: eye.  Pt reports he had a headache last night and did not sleep at all.     Extremity/Trunk Assessment Upper Extremity Assessment Upper Extremity Assessment: Generalized weakness   Lower Extremity Assessment Lower Extremity Assessment: Defer to PT evaluation       Communication Communication Communication: Impaired Factors Affecting Communication: Hearing impaired   Cognition Arousal: Alert Behavior During  Therapy: WFL for tasks assessed/performed                                 Following commands: Intact       Cueing  General Comments   Cueing Techniques: Verbal cues;Tactile cues;Visual cues  skin tears to BUE with significant bruising. L forearm wrapped.   Exercises     Shoulder Instructions      Home Living Family/patient expects to be discharged to:: Private residence Living Arrangements: Spouse/significant other;Children Available Help at Discharge: Family;Available 24 hours/day Type of Home: House Home Access: Ramped entrance     Home Layout: Multi-level Alternate Level Stairs-Number of Steps: 7 Alternate Level Stairs-Rails: Left Bathroom Shower/Tub: Producer, Television/film/video: Handicapped height Bathroom Accessibility: Yes   Home Equipment: Other (comment);Grab bars - tub/shower;Grab bars - toilet;Cane - quad;Rollator (4 wheels)   Additional Comments: Pt reports he uses both a cane and a rollator prior to admission      Prior Functioning/Environment Prior Level of Function : History of Falls (last six months);Driving;Working/employed             Mobility Comments: Pt reports he uses both a cane and walker at home, both about equally.  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 ADLs Comments: Pt reports he was independent with basic self care tasks, assist from spouse and son for home management tasks.  Assistance for medication management.  Pt reports some blurry vision in the left eye for the last year, reports he had a stroke in his left eye.    OT Problem List: Decreased strength;Impaired balance (sitting and/or standing);Decreased activity tolerance;Pain;Decreased cognition   OT Treatment/Interventions: Self-care/ADL training;Therapeutic exercise;Patient/family education;Balance training;Therapeutic activities;DME and/or AE instruction;Cognitive remediation/compensation      OT  Goals(Current goals can be found in the care plan section)   Acute Rehab OT Goals Patient Stated Goal: to return home and do as much as possible OT Goal Formulation: With patient Time For Goal Achievement: 06/01/24 Potential to Achieve Goals: Good ADL Goals Pt Will Perform Lower Body Bathing: (P) with set-up;with supervision Pt Will Perform Lower Body Dressing: (P) with set-up;with supervision Pt Will Transfer to Toilet: (P) with supervision   OT Frequency:  Min 2X/week    Co-evaluation              AM-PAC OT 6 Clicks Daily Activity     Outcome Measure Help from another person eating meals?: None Help from another person taking care of personal grooming?: A Little Help from another person toileting, which includes using toliet, bedpan, or urinal?: A Lot Help from another person bathing (including washing, rinsing, drying)?: A Lot Help from another person to put on and taking off regular upper body clothing?: A Little Help from another person to put on and taking off regular lower body clothing?: A Lot 6 Click Score: 16   End of Session Equipment Utilized During Treatment: Gait belt;Rolling walker (2 wheels)  Activity Tolerance:   Patient left: in bed;with call bell/phone within reach;with bed alarm set  OT Visit Diagnosis: History of falling (Z91.81);Unsteadiness on feet (R26.81);Muscle weakness (generalized) (M62.81);Repeated falls (R29.6);Pain                Time: 1110-1130 OT Time Calculation (min): 20 min Charges:  OT General Charges $OT Visit: 1 Visit OT Evaluation $OT Eval Moderate Complexity: 1 Mod  Damilola Flamm T Murrell Dome, OTR/L, CLT Ozetta Flatley 05/18/2024, 11:53 AM

## 2024-05-18 NOTE — TOC Initial Note (Addendum)
 Transition of Care [facility]) - Initial/Assessment Note    Patient Details  Name: [name] MRN: [id number] Date of Birth: 27-Mar-1941  Transition of Care [facility]) CM/SW Contact:    Delphine KANDICE Bring, RN Phone Number: 05/18/2024, 2:22 PM  Clinical Narrative:                 CM spoke with wife. She states that patient was driving and performing his ADLs independently before his recent fall.  CM informed her that therapy had recommended SNF. Cm informed her that because of his traditional Medicare and OBS status he does not qualify for SNF unless they do private pay. Wife states that patient does not want to go to SNF. She will talk him home with [facility] Cm asked if her had HH in past. She states yes with [facility]. She is in agreement with referral  going to [facility].   CM verified address and phone number. CM informed MD.    1515: MD states patient may need dialysis. Patient is now inpatient  Expected Discharge Plan: Home w Home Health Services     Patient Goals and CMS Choice            Expected Discharge Plan and Services       Living arrangements for the past 2 months: Single Family Home                                      Prior Living Arrangements/Services Living arrangements for the past 2 months: Single Family Home Lives with:: Spouse Patient language and need for interpreter reviewed:: No (English) Do you feel safe going back to the place where you live?: Yes          Current home services: DME    Activities of Daily Living   ADL Screening (condition at time of admission) Independently performs ADLs?: No Does the patient have a NEW difficulty with bathing/dressing/toileting/self-feeding that is expected to last >3 days?: Yes (Initiates electronic notice to provider for possible OT consult) Does the patient have a NEW difficulty with getting in/out of bed, walking, or climbing stairs that is expected to last >3 days?: Yes (Initiates electronic notice to  provider for possible PT consult) Does the patient have a NEW difficulty with communication that is expected to last >3 days?: No Is the patient deaf or have difficulty hearing?: Yes Does the patient have difficulty seeing, even when wearing glasses/contacts?: Yes Does the patient have difficulty concentrating, remembering, or making decisions?: Yes  Permission Sought/Granted                  Emotional Assessment Appearance:: Appears stated age            Admission diagnosis:  Hyperkalemia [E87.5] Patient Active Problem List   Diagnosis Date Noted   Pressure injury of skin 05/18/2024   Leukocytosis 05/17/2024   HTN (hypertension) 05/17/2024   HLD (hyperlipidemia) 05/17/2024   Chronic diastolic CHF (congestive heart failure) (HCC) 05/17/2024   Fall at home, initial encounter 05/17/2024   Blurry vision, left eye 05/17/2024   Atrial fibrillation, chronic (HCC) 05/17/2024   Vitamin D  deficiency 12/04/2023   Bacteremia due to Pseudomonas 10/19/2023   Pancytopenia (HCC) 10/19/2023   Palliative care encounter 10/18/2023   Severe sepsis with acute organ dysfunction (HCC) 10/16/2023   Cough 10/16/2023   Right lower lobe pneumonia 10/16/2023   Weakness 10/16/2023   Hypomagnesemia  10/16/2023   Paroxysmal atrial fibrillation (HCC) 10/10/2023   Mild cognitive impairment 10/09/2023   Persistent atrial fibrillation (HCC) 10/06/2023   Malnutrition of moderate degree 10/03/2023   Traumatic subarachnoid hemorrhage (HCC) 09/29/2023   Subarachnoid hemorrhage following injury (HCC) 09/21/2023   CKD (chronic kidney disease) stage 4, GFR 15-29 ml/min (HCC) 06/29/2023   Pleural effusion 05/30/2023   Atrial flutter (HCC) 05/23/2023   History of CVA (cerebrovascular accident) 05/23/2023   Hypernatremia 05/23/2023   Pericardial effusion 05/23/2023   Pericarditis 05/23/2023   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   Overweight (BMI 25.0-29.9) 05/05/2023   Acute heart  failure with mildly reduced ejection fraction (HFmrEF, 41-49%) (HCC) 05/05/2023   Acute kidney injury superimposed on chronic kidney disease 05/04/2023   Atrial fibrillation with rapid ventricular response (HCC) 05/04/2023   Normocytic anemia 05/03/2023   ST elevation myocardial infarction (STEMI) (HCC) 05/03/2023   Tachycardia 05/03/2023   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/2023   Acute pericarditis 05/02/2023   Altered mental status 04/21/2023   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, of the knee, left 04/21/2023   Knee effusion, left 04/21/2023   Acute renal failure with acute 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) 04/17/2023   History of melanoma 04/17/2023   S/P reverse total shoulder arthroplasty, 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 04/17/2023   Hallucinations 04/17/2023   Hyperkalemia 04/17/2023   Acute pain of left knee 04/17/2023   Delirium 04/17/2023   Acute kidney injury 04/17/2023   Rotator cuff arthropathy, right 02/01/2023   Rotator cuff tendinitis, right 02/01/2023   Traumatic complete tear of right rotator cuff 02/01/2023   Proteinuria, unspecified 10/19/2022   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   Cervical spondylosis 10/07/2021   Hypothyroidism 07/23/2021   Low vitamin B12 level 08/09/2020   Irritability 10/18/2019   Gastroesophageal reflux disease 03/13/2019   Acute on chronic anemia 07/04/2018   Leucopenia 02/05/2018   Supraspinatus tendon tear, right, initial encounter 02/16/2017   Infraspinatus tendon tear, right, initial encounter 02/16/2017   Biceps tendon rupture, right, initial encounter 02/16/2017   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   Hypotension 08/15/2016   Mixed hyperlipidemia 08/15/2016   Dizziness 12/30/2015   BPH (benign prostatic hyperplasia) 03/03/2015   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   Primary osteoarthritis of left knee 03/03/2015   Anticoagulation management encounter 03/03/2015   H/O  neoplasm 01/12/2015   History of nonmelanoma skin cancer 01/12/2015   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   Symptomatic anemia 07/17/2014   Hepatosplenomegaly 07/17/2014   Neutropenia 07/17/2014   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 [place] [place]  [address] Phone: [phone] Fax: [phone]  [facility] [place], [place] - [address] [address] [place] [address] Phone: [phone] Fax: [phone]  [facility] [address] [place] [address] Phone: [phone] Fax: [phone]  [facility] [address] [place] [address] Phone: [phone] Fax: [phone]     Social Drivers of Health (SDOH) Social History: SDOH Screenings   Food Insecurity: No Food Insecurity (05/17/2024)  Housing: Low Risk (05/17/2024)  Transportation Needs: No Transportation Needs (05/17/2024)  Utilities: Not At Risk (05/17/2024)  Alcohol Screen: Low Risk (07/12/2021)  Depression (PHQ2-9): Low Risk (05/10/2024)  Financial Resource Strain: Low Risk  (05/14/2024)   Received from [facility]  Physical Activity: Inactive (07/12/2021)  Social Connections: Socially Integrated (05/17/2024)  Stress: No Stress Concern Present (07/12/2021)  Tobacco Use: Medium Risk (05/17/2024)  Health Literacy: Medium Risk (06/09/2023)   Received from [facility]   SDOH Interventions:     Readmission Risk Interventions    10/17/2023    3:18 PM 04/21/2023   10:16 AM  Readmission Risk Prevention Plan   Transportation Screening Complete Complete  HRI or Home Care Consult  Complete  Social Work Consult for Recovery Care Planning/Counseling  Complete  Palliative Care Screening  Not Applicable  Medication Review Ocean[facility]) Complete Complete  PCP or Specialist appointment within 3-5 days of discharge Complete   SW Recovery Care/Counseling Consult Complete   Palliative Care Screening Not Applicable   Skilled Nursing Facility Not Applicable

## 2024-05-18 NOTE — Care Management Obs Status (Signed)
 MEDICARE OBSERVATION STATUS NOTIFICATION   Patient Details  Name: [name] MRN: [id number] Date of Birth: 1940-11-30   Medicare Observation Status Notification Given:  Yes    Delphine KANDICE Bring, RN 05/18/2024, 2:18 PM

## 2024-05-18 NOTE — Progress Notes (Signed)
 [facility]  ROUNDING NOTE   Subjective:   Patient with past medical history of chronic kidney disease stage V, CVA, myelofibrosis, hypertension, upper lipidemia, coronary disease, history of myocardial infarction, diastolic heart failure, hypothyroidism, melanoma, mild cognitive impairment, BPH, atrial fibrillation, nephrolithiasis, history of pericarditis who presented with weakness and fall.  Patient well-known to my partner from the office.  Dr. Dennise has been following her closely.  Most recent outpatient eGFR was 13.  Renal function worse now with eGFR of 9.  Patient and Dr. Dennise have had conversations regarding renal placement therapy.  It appears that the patient is open to starting renal placement therapy at this time.  Objective:  Vital signs in last 24 hours:  Temp:  [97.4 F (36.3 C)-98.3 F (36.8 C)] 97.9 F (36.6 C) (12/13 0827) Pulse Rate:  [57-76] 66 (12/13 0827) Resp:  [14-19] 19 (12/13 0827) BP: (152-202)/(83-98) 152/83 (12/13 0827) SpO2:  [96 %-100 %] 100 % (12/13 0827) Weight:  [67.4 kg-74.3 kg] 67.4 kg (12/13 0500)  Weight change:  Filed Weights   05/17/24 1536 05/17/24 2047 05/18/24 0500  Weight: 74.3 kg 69.3 kg 67.4 kg    Intake/Output: I/O last 3 completed shifts: In: 1000 [IV Piggyback:1000] Out: 850 [Urine:850]   Intake/Output this shift:  No intake/output data recorded.  Physical Exam: General: No acute distress  Head: Normocephalic, atraumatic. Moist oral mucosal membranes  Neck: Supple  Lungs:  Clear to auscultation, normal effort  Heart: S1S2 no rubs  Abdomen:  Soft, nontender, bowel sounds present  Extremities: No peripheral edema.  Neurologic: Awake, alert, following commands  Skin: No acute rash  Access: No hemodialysis access    Basic Metabolic Panel: Recent Labs  Lab 05/17/24 1538 05/17/24 1809 05/17/24 2213 05/18/24 0517  NA 138  --   --  139  K 6.1*  --  5.5* 5.3*  CL 104  --   --  105  CO2 20*  --   --  21*   GLUCOSE 96  --   --  92  BUN 81*  --   --  75*  CREATININE 5.77*  --   --  5.20*  CALCIUM  9.0  --   --  8.7*  MG  --  2.1  --   --   PHOS 5.3*  --   --   --     Liver Function Tests: Recent Labs  Lab 05/17/24 1538  AST 25  ALT 21  ALKPHOS 142*  BILITOT 0.5  PROT 6.7  ALBUMIN  4.1   No results for input(s): LIPASE, AMYLASE in the last 168 hours. No results for input(s): AMMONIA in the last 168 hours.  CBC: Recent Labs  Lab 05/17/24 1538 05/18/24 0517  WBC 14.7* 14.0*  NEUTROABS 8.5*  --   HGB 9.7* 8.7*  HCT 30.7* 26.4*  MCV 94.2 91.7  PLT 321 299    Cardiac Enzymes: No results for input(s): CKTOTAL, CKMB, CKMBINDEX, TROPONINI in the last 168 hours.  BNP: Invalid input(s): POCBNP  CBG: Recent Labs  Lab 05/17/24 1845  GLUCAP 78    Microbiology: Results for orders placed or performed during the hospital encounter of 05/17/24  MRSA Next Gen by PCR, Nasal     Status: None   Collection Time: 05/18/24 12:25 AM   Specimen: Nasal Mucosa; Nasal Swab  Result Value Ref Range Status   MRSA by PCR Next Gen NOT DETECTED NOT DETECTED Final    Comment: (NOTE) The GeneXpert MRSA Assay (FDA approved  for NASAL specimens only), is one component of a comprehensive MRSA colonization surveillance program. It is not intended to diagnose MRSA infection nor to guide or monitor treatment for MRSA infections. Test performance is not FDA approved in patients less than 86 years old. Performed at [facility], [address] [place], [place] [address]    *Note: Due to a large number of results and/or encounters for the requested time period, some results have not been displayed. A complete set of results can be found in Results Review.    Coagulation Studies: No results for input(s): LABPROT, INR in the last 72 hours.  Urinalysis: No results for input(s): COLORURINE, LABSPEC, PHURINE, GLUCOSEU, HGBUR, BILIRUBINUR, KETONESUR,  PROTEINUR, UROBILINOGEN, NITRITE, LEUKOCYTESUR in the last 72 hours.  Invalid input(s): APPERANCEUR    Imaging: DG Forearm Left Result Date: 05/17/2024 CLINICAL DATA:  Fall, laceration EXAM: DG FOREARM 2V*L* COMPARISON:  None Available. FINDINGS: No definitive fracture or malalignment. Moderate degenerative change at the radiocarpal joint. Prominent cysts in the carpal bones. Soft tissue swelling at the wrist. IMPRESSION: No acute osseous abnormality. Electronically Signed   By: Luke Bun M.D.   On: 05/17/2024 18:49   CT Head Wo Contrast Result Date: 05/17/2024 CLINICAL DATA:  Head trauma EXAM: CT HEAD WITHOUT CONTRAST CT CERVICAL SPINE WITHOUT CONTRAST TECHNIQUE: Multidetector CT imaging of the head and cervical spine was performed following the standard protocol without intravenous contrast. Multiplanar CT image reconstructions of the cervical spine were also generated. RADIATION DOSE REDUCTION: This exam was performed according to the departmental dose-optimization program which includes automated exposure control, adjustment of the mA and/or kV according to patient size and/or use of iterative reconstruction technique. COMPARISON:  CT brain 05/09/2024 FINDINGS: CT HEAD FINDINGS Brain: No acute territorial infarction, hemorrhage or intracranial mass. Atrophy and mild chronic small vessel ischemic changes of the white matter. Stable ventricle size Vascular: No hyperdense vessels.  Carotid vascular calcification Skull: Normal. Negative for fracture or focal lesion. Sinuses/Orbits: No acute finding. Other: None CT CERVICAL SPINE FINDINGS Alignment: Mild reversal of cervical lordosis. 4 mm anterolisthesis C5 on C6, stable compared with prior CT from April. Facet alignment is within normal limits. Skull base and vertebrae: Vertebral body heights are grossly maintained. Again visualized is nondisplaced fracture involving the right posterior arch of C1, with interval sclerotic margin, series 2,  image 26, this is chronic compared to the CT from April. No new fracture is seen. Soft tissues and spinal canal: No prevertebral fluid or swelling. No visible canal hematoma. Disc levels: Partial interbody ankylosis C2-C3 and C4-C5 with facet ankylosis at these levels. Diffuse advanced disc space narrowing C2 through T1. Heterogeneous vertebral sclerosis also without change. Hypertrophic facet degenerative changes at multiple levels with multilevel moderate severe foraminal stenosis. Upper chest: Negative. Other: None IMPRESSION: 1. No CT evidence for acute intracranial abnormality. Atrophy and chronic small vessel ischemic changes of the white matter. 2. Mild reversal of cervical lordosis with chronic 4 mm anterolisthesis C5 on C6. No acute osseous abnormality. Advanced multilevel degenerative changes. 3. Chronic nondisplaced fracture involving the right posterior arch of C1. Electronically Signed   By: Luke Bun M.D.   On: 05/17/2024 18:48   CT Cervical Spine Wo Contrast Result Date: 05/17/2024 CLINICAL DATA:  Head trauma EXAM: CT HEAD WITHOUT CONTRAST CT CERVICAL SPINE WITHOUT CONTRAST TECHNIQUE: Multidetector CT imaging of the head and cervical spine was performed following the standard protocol without intravenous contrast. Multiplanar CT image reconstructions of the cervical spine were also generated.  RADIATION DOSE REDUCTION: This exam was performed according to the departmental dose-optimization program which includes automated exposure control, adjustment of the mA and/or kV according to patient size and/or use of iterative reconstruction technique. COMPARISON:  CT brain 05/09/2024 FINDINGS: CT HEAD FINDINGS Brain: No acute territorial infarction, hemorrhage or intracranial mass. Atrophy and mild chronic small vessel ischemic changes of the white matter. Stable ventricle size Vascular: No hyperdense vessels.  Carotid vascular calcification Skull: Normal. Negative for fracture or focal lesion.  Sinuses/Orbits: No acute finding. Other: None CT CERVICAL SPINE FINDINGS Alignment: Mild reversal of cervical lordosis. 4 mm anterolisthesis C5 on C6, stable compared with prior CT from April. Facet alignment is within normal limits. Skull base and vertebrae: Vertebral body heights are grossly maintained. Again visualized is nondisplaced fracture involving the right posterior arch of C1, with interval sclerotic margin, series 2, image 26, this is chronic compared to the CT from April. No new fracture is seen. Soft tissues and spinal canal: No prevertebral fluid or swelling. No visible canal hematoma. Disc levels: Partial interbody ankylosis C2-C3 and C4-C5 with facet ankylosis at these levels. Diffuse advanced disc space narrowing C2 through T1. Heterogeneous vertebral sclerosis also without change. Hypertrophic facet degenerative changes at multiple levels with multilevel moderate severe foraminal stenosis. Upper chest: Negative. Other: None IMPRESSION: 1. No CT evidence for acute intracranial abnormality. Atrophy and chronic small vessel ischemic changes of the white matter. 2. Mild reversal of cervical lordosis with chronic 4 mm anterolisthesis C5 on C6. No acute osseous abnormality. Advanced multilevel degenerative changes. 3. Chronic nondisplaced fracture involving the right posterior arch of C1. Electronically Signed   By: Luke Bun M.D.   On: 05/17/2024 18:48     Medications:     amiodarone   200 mg Oral Daily   amLODipine   5 mg Oral Daily   cyanocobalamin   1,000 mcg Oral Daily   DULoxetine   20 mg Oral QHS   feeding supplement  237 mL Oral BID BM   levothyroxine   150 mcg Oral QAC breakfast   metoprolol  succinate  25 mg Oral BID   multivitamin with minerals  1 tablet Oral q AM   pantoprazole   40 mg Oral Daily   simvastatin   40 mg Oral Daily   sodium bicarbonate   650 mg Oral TID   tamsulosin   0.4 mg Oral Daily   acetaminophen , hydrALAZINE , morphine  injection, ondansetron  (ZOFRAN ) IV,  traMADol , traZODone   Assessment/ Plan:  83 y.o. male chronic kidney disease stage V, CVA, myelofibrosis, hypertension, upper lipidemia, coronary disease, history of myocardial infarction, diastolic heart failure, hypothyroidism, melanoma, mild cognitive impairment, BPH, atrial fibrillation, nephrolithiasis, history of pericarditis who presented with weakness and fall.  Patient found to have worsening underlying chronic kidney disease.  1.  Chronic kidney disease stage V.  Patient has ongoing severe renal dysfunction.  Most recent outpatient eGFR was 13 on 03/14/2024.  eGFR now down to 9.  Patient having weakness and fatigue.  We had a long conversation about proceeding with renal replacement therapy today.  Patient remains open to renal placement therapy as he had prior discussions with Dr. Dennise regarding this.  We will consult with vascular surgery for PermCath placement on Monday.  2.  Hyperkalemia.  Serum potassium down to 5.3.  Continue to monitor closely.  May need to use Lokelma  or Veltassa  if potassium rises again.  3.  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  Hemoglobin currently 8.7.  Start the patient on Mircera once he started on outpatient  dialysis.  4.  Secondary hyperparathyroidism.  Serum phosphorus slightly high at 5.3.  No immediate need for phosphorus binders.  Monitor bone metabolism parameters over the course of the hospitalization.   LOS: 0 Navie Lamoreaux 12/13/20251:04 PM

## 2024-05-18 NOTE — Consult Note (Addendum)
 WOC Nurse Consult Note: Reason for Consult: L forearm skin tear  Wound type: full  thickness tears to L forearm x 2 r/t trauma  Pressure Injury POA: NA  Measurement: see nursing flowsheet  Wound bed: red moist with some dry hemorrhagic tissue at the edges  Drainage (amount, consistency, odor) serosanguinous  Periwound: ecchymosis  Dressing procedure/placement/frequency: Cleanse L forearm skin tears with NS, apply Xeroform  gauze ([place] [phone]) to wound bed daily, cover with telfa nonstick dressing or ABD pad and secure with Kerlix roll gauze.  SOAK DRESSING WITH NS IF ADHERED TO WOUND BED FOR ATRAUMATIC REMOVAL   WOC team will not follow. Reconsult if further needs arise.   Thank you,    Powell Bar MSN, RN-BC, [employer]

## 2024-05-18 NOTE — Progress Notes (Addendum)
 Progress Note    [name]  FMW:[id number] DOB: 07/16/1940  DOA: 05/17/2024 PCP: Vicci Duwaine SQUIBB, DO      Brief Narrative:    Medical records reviewed and are as summarized below:  [name] is a 83 y.o. male  with medical history significant of CKD-IV, stroke, myelofibrosis, HTN, HLD, CAD, STEMI, chronic dCHF, hypothyroidism, melanoma, mild cognitive impairment, BPH, chronic left eye blurry vision (for more than 1 year and told by ophthalmologist that he had a left eye stroke), SDH, A fib not on AC, kidney stone, retinal detachment, pericarditis, chronic bilateral knee problem. He presented to the hospital with general weakness and fall.  He fell at least 2 times at home.    CT head IMPRESSION: 1. No CT evidence for acute intracranial abnormality. Atrophy and chronic small vessel ischemic changes of the white matter. 2. Mild reversal of cervical lordosis with chronic 4 mm anterolisthesis C5 on C6. No acute osseous abnormality. Advanced multilevel degenerative changes. 3. Chronic nondisplaced fracture involving the right posterior arch of C1.   He was found to have worsening CKD with hyperkalemia.  Creatinine was 5.77 and potassium was 6.1 on admission.    Assessment/Plan:   Principal Problem:   Hyperkalemia Active Problems:   CKD (chronic kidney disease) stage 4, GFR 15-29 ml/min (HCC)   Fall at home, initial encounter   Leukocytosis   History of CVA (cerebrovascular accident)   Blurry vision, left eye   Myelofibrosis (HCC)   HTN (hypertension)   HLD (hyperlipidemia)   Hypothyroidism   Chronic diastolic CHF (congestive heart failure) (HCC)   BPH (benign prostatic hyperplasia)   Paroxysmal atrial fibrillation (HCC)   Pressure injury of skin   Body mass index is 20.72 kg/m.    Hypokalemia: Improving.  Potassium down from 6.1-5.3.  Continue Lokelma . S/p treatment with IV calcium  gluconate, insulin  and IV sodium bicarbonate .   Worsening CKD  stage V: He was given IV normal saline bolus in the ED.  Nephrologist has been consulted to assist with management. Baseline creatinine 2.9-4.9   Paroxysmal atrial fibrillation: Rate controlled.  Continue metoprolol  and amiodarone . Not on anticoagulation because of history of subdural hematoma.   General weakness, s/p falls at home: PT recommended discharge to SNF.  Chronic diastolic CHF: Compensated. Lasix  on hold because of worsening CKD. 2D echo in February 2025 showed EF estimated 50 to 55%, grade 1 diastolic dysfunction.  Decrease simvastatin  from 40 mg to 20 mg daily because of concern for interaction with amlodipine . LDL was 62 in November 2025   Leukocytosis: Probably reactive.  No evidence of infection thus far.   Comorbidities include myelofibrosis, chronic C1 arch fracture, hypertension, hyperlipidemia, hypothyroidism, history of stroke, left eye blurry vision from left eye stroke, BPH, anemia of chronic disease    Diet Order             Diet Heart Room service appropriate? Yes; Fluid consistency: Thin  Diet effective now                                  Consultants: Nephrologist  Procedures: None    Medications:    amiodarone   200 mg Oral Daily   amLODipine   5 mg Oral Daily   cyanocobalamin   1,000 mcg Oral Daily   DULoxetine   20 mg Oral QHS   feeding supplement  237 mL Oral BID BM   levothyroxine   150  mcg Oral QAC breakfast   metoprolol  succinate  25 mg Oral BID   multivitamin with minerals  1 tablet Oral q AM   pantoprazole   40 mg Oral Daily   simvastatin   40 mg Oral Daily   sodium bicarbonate   650 mg Oral TID   tamsulosin   0.4 mg Oral Daily   Continuous Infusions:   Anti-infectives (From admission, onward)    None              Family Communication/Anticipated D/C date and plan/Code Status   DVT prophylaxis: SCDs Start: 05/17/24 1932     Code Status: Full Code  Family Communication: None Disposition Plan:  Plan to discharge to SNF   Status is: Observation The patient will require care spanning > 2 midnights and should be moved to inpatient because: Hyperkalemia, worsening CKD       Subjective:   Interval events noted.  He complains of feeling sleepy.  No other complaints.  No shortness of breath or chest pain.  No leg swelling.  No problems with urine output  Objective:    Vitals:   05/18/24 0253 05/18/24 0414 05/18/24 0500 05/18/24 0827  BP: (!) 179/96 (!) 156/83  (!) 152/83  Pulse: 70 71  66  Resp: 18 18  19   Temp: 97.6 F (36.4 C)   97.9 F (36.6 C)  TempSrc:      SpO2: 99% 99%  100%  Weight:   67.4 kg   Height:       No data found.   Intake/Output Summary (Last 24 hours) at 05/18/2024 1232 Last data filed at 05/18/2024 0321 Gross per 24 hour  Intake 1000 ml  Output 850 ml  Net 150 ml   Filed Weights   05/17/24 1536 05/17/24 2047 05/18/24 0500  Weight: 74.3 kg 69.3 kg 67.4 kg    Exam:  GEN: NAD SKIN: Warm and dry EYES: No pallor or icterus ENT: MMM, hearing impairment CV: RRR PULM: CTA B ABD: soft, ND, NT, +BS CNS: AAO x 3, non focal EXT: No edema or tenderness    Wound 05/17/24 2100 Pressure Injury Coccyx Left;Medial Stage 2 -  Partial thickness loss of dermis presenting as a shallow open injury with a red, pink wound bed without slough. (Active)     Data Reviewed:   I have personally reviewed following labs and imaging studies:  Labs: Labs show the following:   Basic Metabolic Panel: Recent Labs  Lab 05/17/24 1538 05/17/24 1809 05/17/24 2213 05/18/24 0517  NA 138  --   --  139  K 6.1*  --  5.5* 5.3*  CL 104  --   --  105  CO2 20*  --   --  21*  GLUCOSE 96  --   --  92  BUN 81*  --   --  75*  CREATININE 5.77*  --   --  5.20*  CALCIUM  9.0  --   --  8.7*  MG  --  2.1  --   --   PHOS 5.3*  --   --   --    GFR Estimated Creatinine Clearance: 10.3 mL/min (A) (by C-G formula based on SCr of 5.2 mg/dL (H)). Liver Function  Tests: Recent Labs  Lab 05/17/24 1538  AST 25  ALT 21  ALKPHOS 142*  BILITOT 0.5  PROT 6.7  ALBUMIN  4.1   No results for input(s): LIPASE, AMYLASE in the last 168 hours. No results for input(s): AMMONIA in the last 168  hours. Coagulation profile No results for input(s): INR, PROTIME in the last 168 hours.  CBC: Recent Labs  Lab 05/17/24 1538 05/18/24 0517  WBC 14.7* 14.0*  NEUTROABS 8.5*  --   HGB 9.7* 8.7*  HCT 30.7* 26.4*  MCV 94.2 91.7  PLT 321 299   Cardiac Enzymes: No results for input(s): CKTOTAL, CKMB, CKMBINDEX, TROPONINI in the last 168 hours. BNP (last 3 results) Recent Labs    05/17/24 1538  PROBNP 10,364.0*   CBG: Recent Labs  Lab 05/17/24 1845  GLUCAP 78   D-Dimer: No results for input(s): DDIMER in the last 72 hours. Hgb A1c: No results for input(s): HGBA1C in the last 72 hours. Lipid Profile: No results for input(s): CHOL, HDL, LDLCALC, TRIG, CHOLHDL, LDLDIRECT in the last 72 hours. Thyroid  function studies: No results for input(s): TSH, T4TOTAL, T3FREE, THYROIDAB in the last 72 hours.  Invalid input(s): FREET3 Anemia work up: No results for input(s): VITAMINB12, FOLATE, FERRITIN, TIBC, IRON, RETICCTPCT in the last 72 hours. Sepsis Labs: Recent Labs  Lab 05/17/24 1538 05/18/24 0517  WBC 14.7* 14.0*    Microbiology Recent Results (from the past 240 hours)  Resp panel by RT-PCR (RSV, Flu A&B, Covid) Anterior Nasal Swab     Status: None   Collection Time: 05/09/24  5:13 PM   Specimen: Anterior Nasal Swab  Result Value Ref Range Status   SARS Coronavirus 2 by RT PCR NEGATIVE NEGATIVE Final    Comment: (NOTE) SARS-CoV-2 target nucleic acids are NOT DETECTED.  The SARS-CoV-2 RNA is generally detectable in upper respiratory specimens during the acute phase of infection. The lowest concentration of SARS-CoV-2 viral copies this assay can detect is 138 copies/mL. A negative result  does not preclude SARS-Cov-2 infection and should not be used as the sole basis for treatment or other patient management decisions. A negative result may occur with  improper specimen collection/handling, submission of specimen other than nasopharyngeal swab, presence of viral mutation(s) within the areas targeted by this assay, and inadequate number of viral copies(<138 copies/mL). A negative result must be combined with clinical observations, patient history, and epidemiological information. The expected result is Negative.  Fact Sheet for Patients:  bloggercourse.com  Fact Sheet for Healthcare Providers:  seriousbroker.it  This test is no t yet approved or cleared by the [place]  FDA and  has been authorized for detection and/or diagnosis of SARS-CoV-2 by FDA under an Emergency Use Authorization (EUA). This EUA will remain  in effect (meaning this test can be used) for the duration of the COVID-19 declaration under Section 564(b)(1) of the Act, 21 U.S.C.section 360bbb-3(b)(1), unless the authorization is terminated  or revoked sooner.       Influenza A by PCR NEGATIVE NEGATIVE Final   Influenza B by PCR NEGATIVE NEGATIVE Final    Comment: (NOTE) The Xpert Xpress SARS-CoV-2/FLU/RSV plus assay is intended as an aid in the diagnosis of influenza from Nasopharyngeal swab specimens and should not be used as a sole basis for treatment. Nasal washings and aspirates are unacceptable for Xpert Xpress SARS-CoV-2/FLU/RSV testing.  Fact Sheet for Patients: bloggercourse.com  Fact Sheet for Healthcare Providers: seriousbroker.it  This test is not yet approved or cleared by the [place]  FDA and has been authorized for detection and/or diagnosis of SARS-CoV-2 by FDA under an Emergency Use Authorization (EUA). This EUA will remain in effect (meaning this test can be used) for  the duration of the COVID-19 declaration under Section 564(b)(1) of the Act, 21 U.S.C. section 360bbb-3(b)(1),  unless the authorization is terminated or revoked.     Resp Syncytial Virus by PCR NEGATIVE NEGATIVE Final    Comment: (NOTE) Fact Sheet for Patients: bloggercourse.com  Fact Sheet for Healthcare Providers: seriousbroker.it  This test is not yet approved or cleared by the [place]  FDA and has been authorized for detection and/or diagnosis of SARS-CoV-2 by FDA under an Emergency Use Authorization (EUA). This EUA will remain in effect (meaning this test can be used) for the duration of the COVID-19 declaration under Section 564(b)(1) of the Act, 21 U.S.C. section 360bbb-3(b)(1), unless the authorization is terminated or revoked.  Performed at [facility], [address] [place], [place] [address]   MRSA Next Gen by PCR, Nasal     Status: None   Collection Time: 05/18/24 12:25 AM   Specimen: Nasal Mucosa; Nasal Swab  Result Value Ref Range Status   MRSA by PCR Next Gen NOT DETECTED NOT DETECTED Final    Comment: (NOTE) The GeneXpert MRSA Assay (FDA approved for NASAL specimens only), is one component of a comprehensive MRSA colonization surveillance program. It is not intended to diagnose MRSA infection nor to guide or monitor treatment for MRSA infections. Test performance is not FDA approved in patients less than 71 years old. Performed at [facility], [address] [place], [place] [address]     Procedures and diagnostic studies:  DG Forearm Left Result Date: 05/17/2024 CLINICAL DATA:  Fall, laceration EXAM: DG FOREARM 2V*L* COMPARISON:  None Available. FINDINGS: No definitive fracture or malalignment. Moderate degenerative change at the radiocarpal joint. Prominent cysts in the carpal bones. Soft tissue swelling at the wrist. IMPRESSION: No acute osseous abnormality. Electronically  Signed   By: Luke Bun M.D.   On: 05/17/2024 18:49   CT Head Wo Contrast Result Date: 05/17/2024 CLINICAL DATA:  Head trauma EXAM: CT HEAD WITHOUT CONTRAST CT CERVICAL SPINE WITHOUT CONTRAST TECHNIQUE: Multidetector CT imaging of the head and cervical spine was performed following the standard protocol without intravenous contrast. Multiplanar CT image reconstructions of the cervical spine were also generated. RADIATION DOSE REDUCTION: This exam was performed according to the departmental dose-optimization program which includes automated exposure control, adjustment of the mA and/or kV according to patient size and/or use of iterative reconstruction technique. COMPARISON:  CT brain 05/09/2024 FINDINGS: CT HEAD FINDINGS Brain: No acute territorial infarction, hemorrhage or intracranial mass. Atrophy and mild chronic small vessel ischemic changes of the white matter. Stable ventricle size Vascular: No hyperdense vessels.  Carotid vascular calcification Skull: Normal. Negative for fracture or focal lesion. Sinuses/Orbits: No acute finding. Other: None CT CERVICAL SPINE FINDINGS Alignment: Mild reversal of cervical lordosis. 4 mm anterolisthesis C5 on C6, stable compared with prior CT from April. Facet alignment is within normal limits. Skull base and vertebrae: Vertebral body heights are grossly maintained. Again visualized is nondisplaced fracture involving the right posterior arch of C1, with interval sclerotic margin, series 2, image 26, this is chronic compared to the CT from April. No new fracture is seen. Soft tissues and spinal canal: No prevertebral fluid or swelling. No visible canal hematoma. Disc levels: Partial interbody ankylosis C2-C3 and C4-C5 with facet ankylosis at these levels. Diffuse advanced disc space narrowing C2 through T1. Heterogeneous vertebral sclerosis also without change. Hypertrophic facet degenerative changes at multiple levels with multilevel moderate severe foraminal stenosis.  Upper chest: Negative. Other: None IMPRESSION: 1. No CT evidence for acute intracranial abnormality. Atrophy and chronic small vessel ischemic changes of the white matter.  2. Mild reversal of cervical lordosis with chronic 4 mm anterolisthesis C5 on C6. No acute osseous abnormality. Advanced multilevel degenerative changes. 3. Chronic nondisplaced fracture involving the right posterior arch of C1. Electronically Signed   By: Luke Bun M.D.   On: 05/17/2024 18:48   CT Cervical Spine Wo Contrast Result Date: 05/17/2024 CLINICAL DATA:  Head trauma EXAM: CT HEAD WITHOUT CONTRAST CT CERVICAL SPINE WITHOUT CONTRAST TECHNIQUE: Multidetector CT imaging of the head and cervical spine was performed following the standard protocol without intravenous contrast. Multiplanar CT image reconstructions of the cervical spine were also generated. RADIATION DOSE REDUCTION: This exam was performed according to the departmental dose-optimization program which includes automated exposure control, adjustment of the mA and/or kV according to patient size and/or use of iterative reconstruction technique. COMPARISON:  CT brain 05/09/2024 FINDINGS: CT HEAD FINDINGS Brain: No acute territorial infarction, hemorrhage or intracranial mass. Atrophy and mild chronic small vessel ischemic changes of the white matter. Stable ventricle size Vascular: No hyperdense vessels.  Carotid vascular calcification Skull: Normal. Negative for fracture or focal lesion. Sinuses/Orbits: No acute finding. Other: None CT CERVICAL SPINE FINDINGS Alignment: Mild reversal of cervical lordosis. 4 mm anterolisthesis C5 on C6, stable compared with prior CT from April. Facet alignment is within normal limits. Skull base and vertebrae: Vertebral body heights are grossly maintained. Again visualized is nondisplaced fracture involving the right posterior arch of C1, with interval sclerotic margin, series 2, image 26, this is chronic compared to the CT from April. No  new fracture is seen. Soft tissues and spinal canal: No prevertebral fluid or swelling. No visible canal hematoma. Disc levels: Partial interbody ankylosis C2-C3 and C4-C5 with facet ankylosis at these levels. Diffuse advanced disc space narrowing C2 through T1. Heterogeneous vertebral sclerosis also without change. Hypertrophic facet degenerative changes at multiple levels with multilevel moderate severe foraminal stenosis. Upper chest: Negative. Other: None IMPRESSION: 1. No CT evidence for acute intracranial abnormality. Atrophy and chronic small vessel ischemic changes of the white matter. 2. Mild reversal of cervical lordosis with chronic 4 mm anterolisthesis C5 on C6. No acute osseous abnormality. Advanced multilevel degenerative changes. 3. Chronic nondisplaced fracture involving the right posterior arch of C1. Electronically Signed   By: Luke Bun M.D.   On: 05/17/2024 18:48               LOS: 0 days   Nemiah Bubar  [facility]   Pager on [email or web address]. If 7PM-7AM, please contact night-coverage at [email or web address]     05/18/2024, 12:32 PM

## 2024-05-18 NOTE — Evaluation (Signed)
 Physical Therapy Evaluation Patient Details Name: [name] MRN: [id number] DOB: [birth year] Today's Date: 05/18/2024  History of Present Illness  Pt admitted to [facility] on 05/17/24 under observation for c/o multiple falls and blurred vision. 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 Significant PMH includes: CKD-IV, stroke, myelofibrosis, HTN, HLD, CAD, STEMI, dCHF, hypothyroidism, melanoma, mild cognitive impairment, BPH, chronic left eye blurry vision, SDH, A fib not on AC, kidney stone, retinal detachment, pericarditis.   Clinical Impression  Pt received in supine and is agreeable for PT eval. Pt questionable historian due to baseline dementia. At baseline, pt is mod I with ADL's, ambulation with rollator vs SPC, driving, and working some as a engineer, mining; family assists with IADL's and medication management.   Pt presents with L eye vision deficits (baseline), functional weakness, decreased gross balance, impaired fine and gross motor coordination, decreased activity tolerance, impaired skin integrity, and impaired safety awareness, resulting in impaired functional mobility from baseline. Due to deficits, pt required modA for bed mobility, modA for transfers, and min-modA for ambulation with R HHA (mimic use of SPC at home). Posterior LOB noted with initial standing and turns demonstrating limited balance reactive strategies, which further increase his risk of falls and injury.   Deficits limit the pt's ability to safely and independently perform ADL's, transfer, and ambulate. Pt will benefit from acute skilled PT services to address deficits for return to baseline function. Pt will benefit from post acute therapy services to address deficits for return to baseline function.  Encourage OOB mobility with nursing and mobility tech for meals and toileting for continued progress towards goals and maintenance of IND with functional mobility  while hospitalized.          If plan is discharge home, recommend the following: A lot of help with walking and/or transfers;A little help with bathing/dressing/bathroom;Assistance with cooking/housework;Direct supervision/assist for medications management;Assist for transportation;Help with stairs or ramp for entrance   Can travel by private vehicle   Yes    Equipment Recommendations BSC/3in1     Functional Status Assessment Patient has had a recent decline in their functional status and demonstrates the ability to make significant improvements in function in a reasonable and predictable amount of time.     Precautions / Restrictions Precautions Precautions: Fall Precaution/Restrictions Comments: dementia, L eye blurred vision Restrictions Other Position/Activity Restrictions: SpO2 >/= 92%      Mobility  Bed Mobility               General bed mobility comments: modA for trunk facilitation to sit EOB, HOB flat, increased reliance on UE support and momentum to facilitate transfer.    Transfers                   General transfer comment: modA for power to stand from EOB without AD, posterior LE bracing at EOB with posterior bias, increased assist for correction. minA for controlled descent to sit in recliner without AD, use of BUE for support, demo's poor eccentric lowering. multimodal cues for safety, sequencing, and hand placement.    Ambulation/Gait Ambulation/Gait assistance: Min assist, Mod assist Gait Distance (Feet): 10 Feet Assistive device: 1 person hand held assist         General Gait Details: fluctuating min-modA to ambulate short distance in room with R HHA to mimic use of SPC at home. demo's ataxic-like movement with decreased step length/foot clearance, impaired gross motor coordination, limited lateral weight shift with step  facilitation, and imbalance. intermittent reaching outside BOS for steadying.     Balance Overall balance  assessment: Needs assistance Sitting-balance support: Feet supported, No upper extremity supported Sitting balance-Leahy Scale: Fair   Postural control: Posterior lean Standing balance support: During functional activity, Single extremity supported Standing balance-Leahy Scale: Poor Standing balance comment: posterior bias in standing requiring increased time/effort/cues for correction; fluctuating min-modA for ambulation with R HHA (mimic use of SPC at home); limited balance reactive strategies                             Pertinent Vitals/Pain Pain Assessment Pain Assessment: 0-10 Pain Score: 5  Pain Location: eye Pain Descriptors / Indicators:  (unable to describe)    Home Living Family/patient expects to be discharged to:: Private residence Living Arrangements: Spouse/significant other;Children Available Help at Discharge: Family;Available 24 hours/day Type of Home: House Home Access: Ramped entrance     Alternate Level Stairs-Number of Steps: 7 Home Layout: Multi-level Home Equipment: Other (comment);Grab bars - tub/shower;Grab bars - toilet;Cane - quad;Rollator (4 wheels)      Prior Function Prior Level of Function : History of Falls (last six months);Driving;Working/employed             Mobility Comments: Mod I household/community ambulation with rollator vs SPC (50/50). Still does some work personal assistant homes. ADLs Comments: mod I with ADL's; assistance with IADL's and medication management     Extremity/Trunk Assessment   Upper Extremity Assessment Upper Extremity Assessment: Generalized weakness    Lower Extremity Assessment Lower Extremity Assessment: Generalized weakness       Communication   Communication Communication: Impaired Factors Affecting Communication: Hearing impaired    Cognition Arousal: Alert     PT - Cognitive impairments: History of cognitive impairments                         Following commands: Intact        Cueing Cueing Techniques: Verbal cues, Tactile cues, Visual cues     General Comments General comments (skin integrity, edema, etc.): skin tears to BUE with significant bruising. L forearm wrapped.    Exercises Other Exercises Other Exercises: Pt edu re: PT role/POC, DC recommendations, benefits of OOB mobility for meals and toileting, call for help, SPC vs RW use for energy conservation, current impairments, rehab prognosis, safety with functional mobility, correcting balance.   Assessment/Plan    PT Assessment Patient needs continued PT services  PT Problem List Decreased strength;Decreased activity tolerance;Decreased balance;Decreased mobility;Decreased safety awareness;Decreased skin integrity;Decreased cognition       PT Treatment Interventions DME instruction;Gait training;Stair training;Functional mobility training;Therapeutic activities;Therapeutic exercise;Balance training;Neuromuscular re-education;Patient/family education    PT Goals (Current goals can be found in the Care Plan section)  Acute Rehab PT Goals Patient Stated Goal: get better PT Goal Formulation: With patient Time For Goal Achievement: 06/01/24 Potential to Achieve Goals: Good    Frequency Min 2X/week        AM-PAC PT 6 Clicks Mobility  Outcome Measure Help needed turning from your back to your side while in a flat bed without using bedrails?: A Little Help needed moving from lying on your back to sitting on the side of a flat bed without using bedrails?: A Lot Help needed moving to and from a bed to a chair (including a wheelchair)?: A Lot Help needed standing up from a chair using your arms (e.g., wheelchair or bedside chair)?: A  Lot Help needed to walk in hospital room?: A Lot Help needed climbing 3-5 steps with a railing? : A Lot 6 Click Score: 13    End of Session Equipment Utilized During Treatment: Gait belt Activity Tolerance: Patient tolerated treatment well Patient left: in  chair;with call bell/phone within reach;with chair alarm set Nurse Communication: Mobility status PT Visit Diagnosis: Unsteadiness on feet (R26.81);Repeated falls (R29.6);Muscle weakness (generalized) (M62.81);Difficulty in walking, not elsewhere classified (R26.2)    Time: 9095-9075 PT Time Calculation (min) (ACUTE ONLY): 20 min   Charges:   PT Evaluation $PT Eval Moderate Complexity: 1 Mod PT Treatments $Therapeutic Activity: 8-22 mins PT General Charges $$ ACUTE PT VISIT: 1 Visit        Camie CHARLENA Kluver, PT, DPT 10:25 AM,05/18/2024 Physical Therapist - [place] [facility]

## 2024-05-19 DIAGNOSIS — E875 Hyperkalemia: Secondary | ICD-10-CM | POA: Diagnosis present

## 2024-05-19 LAB — RENAL FUNCTION PANEL
Albumin: 3.5 g/dL (ref 3.5–5.0)
Anion gap: 13 (ref 5–15)
BUN: 73 mg/dL — ABNORMAL HIGH (ref 8–23)
CO2: 22 mmol/L (ref 22–32)
Calcium: 8.8 mg/dL — ABNORMAL LOW (ref 8.9–10.3)
Chloride: 105 mmol/L (ref 98–111)
Creatinine, Ser: 5.03 mg/dL — ABNORMAL HIGH (ref 0.61–1.24)
GFR, Estimated: 11 mL/min — ABNORMAL LOW (ref >60)
Glucose, Bld: 83 mg/dL (ref 70–99)
Phosphorus: 5.6 mg/dL — ABNORMAL HIGH (ref 2.5–4.6)
Potassium: 6.5 mmol/L (ref 3.5–5.1)
Sodium: 140 mmol/L (ref 135–145)

## 2024-05-19 LAB — GLUCOSE, CAPILLARY: Glucose-Capillary: 119 mg/dL — ABNORMAL HIGH (ref 70–99)

## 2024-05-19 LAB — CBC
HCT: 28.7 % — ABNORMAL LOW (ref 39.0–52.0)
Hemoglobin: 9 g/dL — ABNORMAL LOW (ref 13.0–17.0)
MCH: 30.3 pg (ref 26.0–34.0)
MCHC: 31.4 g/dL (ref 30.0–36.0)
MCV: 96.6 fL (ref 80.0–100.0)
Platelets: 272 K/uL (ref 150–400)
RBC: 2.97 MIL/uL — ABNORMAL LOW (ref 4.22–5.81)
RDW: 17.4 % — ABNORMAL HIGH (ref 11.5–15.5)
WBC: 11.3 K/uL — ABNORMAL HIGH (ref 4.0–10.5)
nRBC: 2.9 % — ABNORMAL HIGH (ref 0.0–0.2)

## 2024-05-19 LAB — POTASSIUM: Potassium: 5.7 mmol/L — ABNORMAL HIGH (ref 3.5–5.1)

## 2024-05-19 MED ORDER — SODIUM ZIRCONIUM CYCLOSILICATE 10 G PO PACK
10.0000 g | PACK | Freq: Two times a day (BID) | ORAL | Status: DC
  Administered 2024-05-19 – 2024-05-21 (×5): 10 g via ORAL
  Filled 2024-05-19 (×6): qty 1

## 2024-05-19 MED ORDER — DEXTROSE 50 % IV SOLN
1.0000 | Freq: Once | INTRAVENOUS | Status: AC
  Administered 2024-05-19: 50 mL via INTRAVENOUS
  Filled 2024-05-19: qty 50

## 2024-05-19 MED ORDER — FINASTERIDE 5 MG PO TABS
5.0000 mg | ORAL_TABLET | Freq: Every day | ORAL | Status: DC
  Administered 2024-05-19 – 2024-05-22 (×4): 5 mg via ORAL
  Filled 2024-05-19 (×5): qty 1

## 2024-05-19 MED ORDER — INSULIN ASPART 100 UNIT/ML IV SOLN
10.0000 [IU] | Freq: Once | INTRAVENOUS | Status: AC
  Administered 2024-05-19: 10 [IU] via INTRAVENOUS
  Filled 2024-05-19: qty 0.1
  Filled 2024-05-19: qty 10

## 2024-05-19 MED ORDER — CHLORHEXIDINE GLUCONATE CLOTH 2 % EX PADS
6.0000 | MEDICATED_PAD | Freq: Every day | CUTANEOUS | Status: DC
  Administered 2024-05-20 – 2024-05-22 (×3): 6 via TOPICAL

## 2024-05-19 MED ORDER — SODIUM ZIRCONIUM CYCLOSILICATE 10 G PO PACK
10.0000 g | PACK | Freq: Once | ORAL | Status: AC
  Administered 2024-05-19: 10 g via ORAL
  Filled 2024-05-19: qty 1

## 2024-05-19 MED ORDER — AMLODIPINE BESYLATE 10 MG PO TABS
10.0000 mg | ORAL_TABLET | Freq: Every day | ORAL | Status: DC
  Administered 2024-05-20 – 2024-05-22 (×3): 10 mg via ORAL
  Filled 2024-05-19 (×3): qty 1

## 2024-05-19 NOTE — Progress Notes (Signed)
 [facility]  ROUNDING NOTE   Subjective:   Patient with past medical history of chronic kidney disease stage V, CVA, myelofibrosis, hypertension, upper lipidemia, coronary disease, history of myocardial infarction, diastolic heart failure, hypothyroidism, melanoma, mild cognitive impairment, BPH, atrial fibrillation, nephrolithiasis, history of pericarditis who presented with weakness and fall.  Patient well-known to my partner from the office.  Dr. Dennise has been following him closely.  Most recent outpatient eGFR was 13.  Renal function worse now with eGFR of 9.  Patient and Dr. Dennise have had conversations regarding renal placement therapy.  It appears that the patient is open to starting renal placement therapy at this time.  Update: Patient seen and evaluated again today.  We again advised that we should proceed with renal placement therapy if patient desires ongoing aggressive care.  He has agreed to proceed with renal placement therapy at this time.  Vascular surgery consulted.  Objective:  Vital signs in last 24 hours:  Temp:  [97.6 F (36.4 C)-98.7 F (37.1 C)] 98.1 F (36.7 C) (12/14 0811) Pulse Rate:  [58-94] 58 (12/14 0811) Resp:  [17-18] 17 (12/14 0811) BP: (147-170)/(79-98) 167/98 (12/14 0811) SpO2:  [95 %-98 %] 95 % (12/14 0811) Weight:  [69.1 kg] 69.1 kg (12/14 0500)  Weight change: -5.2 kg Filed Weights   05/17/24 2047 05/18/24 0500 05/19/24 0500  Weight: 69.3 kg 67.4 kg 69.1 kg    Intake/Output: I/O last 3 completed shifts: In: 1048.5 [IV Piggyback:1048.5] Out: 1800 [Urine:1800]   Intake/Output this shift:  Total I/O In: 220 [P.O.:220] Out: -   Physical Exam: General: No acute distress  Head: Normocephalic, atraumatic. Moist oral mucosal membranes  Neck: Supple  Lungs:  Clear to auscultation, normal effort  Heart: S1S2 no rubs  Abdomen:  Soft, nontender, bowel sounds present  Extremities: No peripheral edema.  Neurologic: Awake, alert,  following commands  Skin: No acute rash  Access: No hemodialysis access    Basic Metabolic Panel: Recent Labs  Lab 05/17/24 1538 05/17/24 1809 05/17/24 2213 05/18/24 0517 05/19/24 0522  NA 138  --   --  139 140  K 6.1*  --  5.5* 5.3* 6.5*  CL 104  --   --  105 105  CO2 20*  --   --  21* 22  GLUCOSE 96  --   --  92 83  BUN 81*  --   --  75* 73*  CREATININE 5.77*  --   --  5.20* 5.03*  CALCIUM  9.0  --   --  8.7* 8.8*  MG  --  2.1  --   --   --   PHOS 5.3*  --   --   --  5.6*    Liver Function Tests: Recent Labs  Lab 05/17/24 1538 05/19/24 0522  AST 25  --   ALT 21  --   ALKPHOS 142*  --   BILITOT 0.5  --   PROT 6.7  --   ALBUMIN  4.1 3.5   No results for input(s): LIPASE, AMYLASE in the last 168 hours. No results for input(s): AMMONIA in the last 168 hours.  CBC: Recent Labs  Lab 05/17/24 1538 05/18/24 0517 05/19/24 0522  WBC 14.7* 14.0* 11.3*  NEUTROABS 8.5*  --   --   HGB 9.7* 8.7* 9.0*  HCT 30.7* 26.4* 28.7*  MCV 94.2 91.7 96.6  PLT 321 299 272    Cardiac Enzymes: No results for input(s): CKTOTAL, CKMB, CKMBINDEX, TROPONINI in the last 168  hours.  BNP: Invalid input(s): POCBNP  CBG: Recent Labs  Lab 05/17/24 1845 05/19/24 1112  GLUCAP 78 119*    Microbiology: Results for orders placed or performed during the hospital encounter of 05/17/24  MRSA Next Gen by PCR, Nasal     Status: None   Collection Time: 05/18/24 12:25 AM   Specimen: Nasal Mucosa; Nasal Swab  Result Value Ref Range Status   MRSA by PCR Next Gen NOT DETECTED NOT DETECTED Final    Comment: (NOTE) The GeneXpert MRSA Assay (FDA approved for NASAL specimens only), is one component of a comprehensive MRSA colonization surveillance program. It is not intended to diagnose MRSA infection nor to guide or monitor treatment for MRSA infections. Test performance is not FDA approved in patients less than 4 years old. Performed at [facility], [address] [place], [place] [address]    *Note: Due to a large number of results and/or encounters for the requested time period, some results have not been displayed. A complete set of results can be found in Results Review.    Coagulation Studies: No results for input(s): LABPROT, INR in the last 72 hours.  Urinalysis: Recent Labs    05/17/24 1811  COLORURINE YELLOW*  LABSPEC 1.016  PHURINE 6.0  GLUCOSEU NEGATIVE  HGBUR NEGATIVE  BILIRUBINUR NEGATIVE  KETONESUR NEGATIVE  PROTEINUR >=300*  NITRITE NEGATIVE  LEUKOCYTESUR NEGATIVE      Imaging: DG Forearm Left Result Date: 05/17/2024 CLINICAL DATA:  Fall, laceration EXAM: DG FOREARM 2V*L* COMPARISON:  None Available. FINDINGS: No definitive fracture or malalignment. Moderate degenerative change at the radiocarpal joint. Prominent cysts in the carpal bones. Soft tissue swelling at the wrist. IMPRESSION: No acute osseous abnormality. Electronically Signed   By: Luke Bun M.D.   On: 05/17/2024 18:49   CT Head Wo Contrast Result Date: 05/17/2024 CLINICAL DATA:  Head trauma EXAM: CT HEAD WITHOUT CONTRAST CT CERVICAL SPINE WITHOUT CONTRAST TECHNIQUE: Multidetector CT imaging of the head and cervical spine was performed following the standard protocol without intravenous contrast. Multiplanar CT image reconstructions of the cervical spine were also generated. RADIATION DOSE REDUCTION: This exam was performed according to the departmental dose-optimization program which includes automated exposure control, adjustment of the mA and/or kV according to patient size and/or use of iterative reconstruction technique. COMPARISON:  CT brain 05/09/2024 FINDINGS: CT HEAD FINDINGS Brain: No acute territorial infarction, hemorrhage or intracranial mass. Atrophy and mild chronic small vessel ischemic changes of the white matter. Stable ventricle size Vascular: No hyperdense vessels.  Carotid vascular calcification Skull: Normal. Negative for fracture or  focal lesion. Sinuses/Orbits: No acute finding. Other: None CT CERVICAL SPINE FINDINGS Alignment: Mild reversal of cervical lordosis. 4 mm anterolisthesis C5 on C6, stable compared with prior CT from April. Facet alignment is within normal limits. Skull base and vertebrae: Vertebral body heights are grossly maintained. Again visualized is nondisplaced fracture involving the right posterior arch of C1, with interval sclerotic margin, series 2, image 26, this is chronic compared to the CT from April. No new fracture is seen. Soft tissues and spinal canal: No prevertebral fluid or swelling. No visible canal hematoma. Disc levels: Partial interbody ankylosis C2-C3 and C4-C5 with facet ankylosis at these levels. Diffuse advanced disc space narrowing C2 through T1. Heterogeneous vertebral sclerosis also without change. Hypertrophic facet degenerative changes at multiple levels with multilevel moderate severe foraminal stenosis. Upper chest: Negative. Other: None IMPRESSION: 1. No CT evidence for acute intracranial abnormality. Atrophy and chronic small vessel ischemic  changes of the white matter. 2. Mild reversal of cervical lordosis with chronic 4 mm anterolisthesis C5 on C6. No acute osseous abnormality. Advanced multilevel degenerative changes. 3. Chronic nondisplaced fracture involving the right posterior arch of C1. Electronically Signed   By: Luke Bun M.D.   On: 05/17/2024 18:48   CT Cervical Spine Wo Contrast Result Date: 05/17/2024 CLINICAL DATA:  Head trauma EXAM: CT HEAD WITHOUT CONTRAST CT CERVICAL SPINE WITHOUT CONTRAST TECHNIQUE: Multidetector CT imaging of the head and cervical spine was performed following the standard protocol without intravenous contrast. Multiplanar CT image reconstructions of the cervical spine were also generated. RADIATION DOSE REDUCTION: This exam was performed according to the departmental dose-optimization program which includes automated exposure control, adjustment of  the mA and/or kV according to patient size and/or use of iterative reconstruction technique. COMPARISON:  CT brain 05/09/2024 FINDINGS: CT HEAD FINDINGS Brain: No acute territorial infarction, hemorrhage or intracranial mass. Atrophy and mild chronic small vessel ischemic changes of the white matter. Stable ventricle size Vascular: No hyperdense vessels.  Carotid vascular calcification Skull: Normal. Negative for fracture or focal lesion. Sinuses/Orbits: No acute finding. Other: None CT CERVICAL SPINE FINDINGS Alignment: Mild reversal of cervical lordosis. 4 mm anterolisthesis C5 on C6, stable compared with prior CT from April. Facet alignment is within normal limits. Skull base and vertebrae: Vertebral body heights are grossly maintained. Again visualized is nondisplaced fracture involving the right posterior arch of C1, with interval sclerotic margin, series 2, image 26, this is chronic compared to the CT from April. No new fracture is seen. Soft tissues and spinal canal: No prevertebral fluid or swelling. No visible canal hematoma. Disc levels: Partial interbody ankylosis C2-C3 and C4-C5 with facet ankylosis at these levels. Diffuse advanced disc space narrowing C2 through T1. Heterogeneous vertebral sclerosis also without change. Hypertrophic facet degenerative changes at multiple levels with multilevel moderate severe foraminal stenosis. Upper chest: Negative. Other: None IMPRESSION: 1. No CT evidence for acute intracranial abnormality. Atrophy and chronic small vessel ischemic changes of the white matter. 2. Mild reversal of cervical lordosis with chronic 4 mm anterolisthesis C5 on C6. No acute osseous abnormality. Advanced multilevel degenerative changes. 3. Chronic nondisplaced fracture involving the right posterior arch of C1. Electronically Signed   By: Luke Bun M.D.   On: 05/17/2024 18:48     Medications:    promethazine  (PHENERGAN ) injection (IM or IVPB) Stopped (05/18/24 1845)    amiodarone    200 mg Oral Daily   [START ON 05/20/2024] amLODipine   10 mg Oral Daily   cyanocobalamin   1,000 mcg Oral Daily   DULoxetine   20 mg Oral QHS   feeding supplement  237 mL Oral BID BM   finasteride   5 mg Oral Daily   levothyroxine   150 mcg Oral QAC breakfast   metoprolol  succinate  25 mg Oral BID   multivitamin with minerals  1 tablet Oral q AM   pantoprazole   40 mg Oral Daily   simvastatin   20 mg Oral Daily   sodium bicarbonate   650 mg Oral TID   tamsulosin   0.4 mg Oral Daily   acetaminophen , hydrALAZINE , morphine  injection, ondansetron  (ZOFRAN ) IV, promethazine  (PHENERGAN ) injection (IM or IVPB), traMADol , traZODone   Assessment/ Plan:  83 y.o. male chronic kidney disease stage V, CVA, myelofibrosis, hypertension, upper lipidemia, coronary disease, history of myocardial infarction, diastolic heart failure, hypothyroidism, melanoma, mild cognitive impairment, BPH, atrial fibrillation, nephrolithiasis, history of pericarditis who presented with weakness and fall.  Patient found to have worsening underlying chronic  kidney disease.  1.  Chronic kidney disease stage V.  Patient has ongoing severe renal dysfunction.  Most recent outpatient eGFR was 13 on 03/14/2024.  eGFR now down to 9.  Patient having weakness and fatigue.   Update: We had another discussion with the patient today.  He is agreeable with proceeding with renal placement therapy at this time as renal function remains low with a eGFR of 10.  Vascular surgery consulted.  Appreciate their assistance.  2.  Hyperkalemia.  Serum potassium higher at 6.5.  Start the patient on Lokelma  10 g twice daily.  3.  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  Hemoglobin up to 9.0.  Consider Mircera once he starts dialysis.  4.  Secondary hyperparathyroidism.  Phosphorus slightly high at 5.6 which should come down with dialysis treatment.   LOS: 1 Devyne Hauger 12/14/20251:18 PM

## 2024-05-19 NOTE — TOC Progression Note (Signed)
 Transition of Care [facility]) - Progression Note    Patient Details  Name: [name] MRN: [id number] Date of Birth: 08/16/1940  Transition of Care [facility]) CM/SW Contact  Lorraine LILLETTE Fenton, LCSW Phone Number: 05/19/2024, 4:29 PM  Clinical Narrative:     ICM consult received for SNF, patient and spouse decline SNF.  CSW followed up with both today to reconfirm. Spouse verified pt is not willing to go to SNF but will accept   [facility] is selected provider, accepted in the Hub.    No further ICM needs at this time.  Expected Discharge Plan: Home w Home Health Services Barriers to Discharge: No Barriers Identified               Expected Discharge Plan and Services       Living arrangements for the past 2 months: Single Family Home                                       Social Drivers of Health (SDOH) Interventions SDOH Screenings   Food Insecurity: No Food Insecurity (05/17/2024)  Housing: Low Risk (05/17/2024)  Transportation Needs: No Transportation Needs (05/17/2024)  Utilities: Not At Risk (05/17/2024)  Alcohol Screen: Low Risk (07/12/2021)  Depression (PHQ2-9): Low Risk (05/10/2024)  Financial Resource Strain: Low Risk  (05/14/2024)   Received from [facility]  Physical Activity: Inactive (07/12/2021)  Social Connections: Socially Integrated (05/17/2024)  Stress: No Stress Concern Present (07/12/2021)  Tobacco Use: Medium Risk (05/17/2024)  Health Literacy: Medium Risk (06/09/2023)   Received from [facility]    Readmission Risk Interventions    10/17/2023    3:18 PM 04/21/2023   10:16 AM  Readmission Risk Prevention Plan  Transportation Screening Complete Complete  HRI or Home Care Consult  Complete  Social Work Consult for Recovery Care Planning/Counseling  Complete  Palliative Care Screening  Not Applicable  Medication Review [employer]) Complete Complete  PCP or Specialist appointment within 3-5 days of discharge Complete    SW Recovery Care/Counseling Consult Complete   Palliative Care Screening Not Applicable   Skilled Nursing Facility Not Applicable

## 2024-05-19 NOTE — Progress Notes (Signed)
 PROGRESS NOTE    [name]  FMW:[id number] DOB: Oct 05, 1940 DOA: 05/17/2024 PCP: Vicci Duwaine SQUIBB, DO  Chief Complaint  Patient presents with   [name] Course:  [name] is an 83 year old male with stage IV CKD, prior CVA, myelofibrosis, hypertension, hyperlipidemia, CAD, prior STEMI, chronic diastolic CHF, hypothyroidism, melanoma, mild cognitive impairment, BPH, chronic left eye blurry vision, subdural hematoma, A-fib not on anticoagulation, history of retinal detachment, history of pericarditis, chronic bilateral knee pain.  Knee presented to the hospital with generalized weakness and a fall after suffering 2 falls at home. Head CT on arrival did not reveal any acute intracranial abnormality.  It did reveal chronically displaced fracture of the right posterior arch of C1. He was found to have worsening hyperkalemia and worsening kidney dysfunction.  Creatinine 5.7, potassium 6.1.  Nephrology was consulted.  Patient is consenting to dialysis.  He is going for catheter placement tomorrow with vascular surgery.  Subjective: Patient has no acute complaints.  He is lying in bed, appears pleasantly confused.   Objective: Vitals:   05/18/24 2324 05/19/24 0500 05/19/24 0504 05/19/24 0811  BP: (!) 153/92  (!) 165/86 (!) 167/98  Pulse: 62  60 (!) 58  Resp: 18  17 17   Temp: 98.2 F (36.8 C)  97.8 F (36.6 C) 98.1 F (36.7 C)  TempSrc:      SpO2: 96%  96% 95%  Weight:  69.1 kg    Height:        Intake/Output Summary (Last 24 hours) at 05/19/2024 1220 Last data filed at 05/19/2024 0900 Gross per 24 hour  Intake 268.51 ml  Output 950 ml  Net -681.49 ml   Filed Weights   05/17/24 2047 05/18/24 0500 05/19/24 0500  Weight: 69.3 kg 67.4 kg 69.1 kg    Examination: General exam: Appears calm and comfortable, NAD Respiratory system: No work of breathing, symmetric chest wall expansion Cardiovascular system: S1 & S2 heard, RRR.  Gastrointestinal system: Abdomen is  nondistended, soft and nontender.   Assessment & Plan:  Principal Problem:   Hyperkalemia Active Problems:   CKD (chronic kidney disease) stage 4, GFR 15-29 ml/min (HCC)   Fall at home, initial encounter   Leukocytosis   History of CVA (cerebrovascular accident)   Blurry vision, left eye   Myelofibrosis (HCC)   HTN (hypertension)   HLD (hyperlipidemia)   Hypothyroidism   Chronic diastolic CHF (congestive heart failure) (HCC)   BPH (benign prostatic hyperplasia)   Paroxysmal atrial fibrillation (HCC)   Pressure injury of skin    Hyperkalemia - 6.1 on arrival, up to 6.1 again today - Continue with shifters as needed. - Nephrology following and planning for initiation of HD tomorrow after catheter placement - Repeat potassium this afternoon to ensure resolution  CKD stage IV - Has been following with nephrology outpatient and has been gradually moving towards dialysis - Now planning to start HD tomorrow - [facility] consult for assistance with HD. Outpatient - Vascular surgery planning for permacath placement tomorrow - N.p.o. after midnight  Anemia of chronic disease Myelofibrosis - Hemoglobin currently stable. - Monitor CBC closely - To be started on Micera per nephro  Paroxysmal atrial fibrillation - Currently rate controlled - Continue metoprolol  and amiodarone  - Not on AC at home due to history of SDH  Generalized weakness Recurrent falls - PT/OT, currently recommending SNF - [facility] consultation for SNF arrangements  Chronic diastolic CHF - Echo February 2025: EF 50 to 55%, grade 1 diastolic  dysfunction - Euvolemic at this time - Lasix  as needed - Additional volume management per HD  Hyperlipidemia - Continue statin - LDL 62  Leukocytosis - Likely reactive, no evidence of infection  Chronic C1 arch fracture - PT/OT as above  Hypertension - Titrate meds as needed - Increase amlodipine  today  History of CVA History of SDH Chronic blurry vision in left  eye - Resume all home meds  BPH - Resume home meds  DVT prophylaxis: Hold AC for Permacath placement in AM. SCDs fornow   Code Status: Full Code Disposition:  Needs SNF and HD. TOC aware  Consultants:    Procedures:    Antimicrobials:  Anti-infectives (From admission, onward)    None       Data Reviewed: I have personally reviewed following labs and imaging studies CBC: Recent Labs  Lab 05/17/24 1538 05/18/24 0517 05/19/24 0522  WBC 14.7* 14.0* 11.3*  NEUTROABS 8.5*  --   --   HGB 9.7* 8.7* 9.0*  HCT 30.7* 26.4* 28.7*  MCV 94.2 91.7 96.6  PLT 321 299 272   Basic Metabolic Panel: Recent Labs  Lab 05/17/24 1538 05/17/24 1809 05/17/24 2213 05/18/24 0517 05/19/24 0522  NA 138  --   --  139 140  K 6.1*  --  5.5* 5.3* 6.5*  CL 104  --   --  105 105  CO2 20*  --   --  21* 22  GLUCOSE 96  --   --  92 83  BUN 81*  --   --  75* 73*  CREATININE 5.77*  --   --  5.20* 5.03*  CALCIUM  9.0  --   --  8.7* 8.8*  MG  --  2.1  --   --   --   PHOS 5.3*  --   --   --  5.6*   GFR: Estimated Creatinine Clearance: 10.9 mL/min (A) (by C-G formula based on SCr of 5.03 mg/dL (H)). Liver Function Tests: Recent Labs  Lab 05/17/24 1538 05/19/24 0522  AST 25  --   ALT 21  --   ALKPHOS 142*  --   BILITOT 0.5  --   PROT 6.7  --   ALBUMIN  4.1 3.5   CBG: Recent Labs  Lab 05/17/24 1845 05/19/24 1112  GLUCAP 78 119*    Recent Results (from the past 240 hours)  Resp panel by RT-PCR (RSV, Flu A&B, Covid) Anterior Nasal Swab     Status: None   Collection Time: 05/09/24  5:13 PM   Specimen: Anterior Nasal Swab  Result Value Ref Range Status   SARS Coronavirus 2 by RT PCR NEGATIVE NEGATIVE Final    Comment: (NOTE) SARS-CoV-2 target nucleic acids are NOT DETECTED.  The SARS-CoV-2 RNA is generally detectable in upper respiratory specimens during the acute phase of infection. The lowest concentration of SARS-CoV-2 viral copies this assay can detect is 138 copies/mL. A  negative result does not preclude SARS-Cov-2 infection and should not be used as the sole basis for treatment or other patient management decisions. A negative result may occur with  improper specimen collection/handling, submission of specimen other than nasopharyngeal swab, presence of viral mutation(s) within the areas targeted by this assay, and inadequate number of viral copies(<138 copies/mL). A negative result must be combined with clinical observations, patient history, and epidemiological information. The expected result is Negative.  Fact Sheet for Patients:  bloggercourse.com  Fact Sheet for Healthcare Providers:  seriousbroker.it  This test is no t yet  approved or cleared by the [place]  FDA and  has been authorized for detection and/or diagnosis of SARS-CoV-2 by FDA under an Emergency Use Authorization (EUA). This EUA will remain  in effect (meaning this test can be used) for the duration of the COVID-19 declaration under Section 564(b)(1) of the Act, 21 U.S.C.section 360bbb-3(b)(1), unless the authorization is terminated  or revoked sooner.       Influenza A by PCR NEGATIVE NEGATIVE Final   Influenza B by PCR NEGATIVE NEGATIVE Final    Comment: (NOTE) The Xpert Xpress SARS-CoV-2/FLU/RSV plus assay is intended as an aid in the diagnosis of influenza from Nasopharyngeal swab specimens and should not be used as a sole basis for treatment. Nasal washings and aspirates are unacceptable for Xpert Xpress SARS-CoV-2/FLU/RSV testing.  Fact Sheet for Patients: bloggercourse.com  Fact Sheet for Healthcare Providers: seriousbroker.it  This test is not yet approved or cleared by the [place]  FDA and has been authorized for detection and/or diagnosis of SARS-CoV-2 by FDA under an Emergency Use Authorization (EUA). This EUA will remain in effect (meaning this test can  be used) for the duration of the COVID-19 declaration under Section 564(b)(1) of the Act, 21 U.S.C. section 360bbb-3(b)(1), unless the authorization is terminated or revoked.     Resp Syncytial Virus by PCR NEGATIVE NEGATIVE Final    Comment: (NOTE) Fact Sheet for Patients: bloggercourse.com  Fact Sheet for Healthcare Providers: seriousbroker.it  This test is not yet approved or cleared by the [place]  FDA and has been authorized for detection and/or diagnosis of SARS-CoV-2 by FDA under an Emergency Use Authorization (EUA). This EUA will remain in effect (meaning this test can be used) for the duration of the COVID-19 declaration under Section 564(b)(1) of the Act, 21 U.S.C. section 360bbb-3(b)(1), unless the authorization is terminated or revoked.  Performed at [facility], [address] [place], [place] [address]   MRSA Next Gen by PCR, Nasal     Status: None   Collection Time: 05/18/24 12:25 AM   Specimen: Nasal Mucosa; Nasal Swab  Result Value Ref Range Status   MRSA by PCR Next Gen NOT DETECTED NOT DETECTED Final    Comment: (NOTE) The GeneXpert MRSA Assay (FDA approved for NASAL specimens only), is one component of a comprehensive MRSA colonization surveillance program. It is not intended to diagnose MRSA infection nor to guide or monitor treatment for MRSA infections. Test performance is not FDA approved in patients less than 43 years old. Performed at [facility], [address] [place], [place] [address]      Radiology Studies: DG Forearm Left Result Date: 05/17/2024 CLINICAL DATA:  Fall, laceration EXAM: DG FOREARM 2V*L* COMPARISON:  None Available. FINDINGS: No definitive fracture or malalignment. Moderate degenerative change at the radiocarpal joint. Prominent cysts in the carpal bones. Soft tissue swelling at the wrist. IMPRESSION: No acute osseous abnormality. Electronically Signed    By: Luke Bun M.D.   On: 05/17/2024 18:49   CT Head Wo Contrast Result Date: 05/17/2024 CLINICAL DATA:  Head trauma EXAM: CT HEAD WITHOUT CONTRAST CT CERVICAL SPINE WITHOUT CONTRAST TECHNIQUE: Multidetector CT imaging of the head and cervical spine was performed following the standard protocol without intravenous contrast. Multiplanar CT image reconstructions of the cervical spine were also generated. RADIATION DOSE REDUCTION: This exam was performed according to the departmental dose-optimization program which includes automated exposure control, adjustment of the mA and/or kV according to patient size and/or use of iterative reconstruction technique. COMPARISON:  CT brain 05/09/2024 FINDINGS: CT HEAD FINDINGS Brain: No acute territorial infarction, hemorrhage or intracranial mass. Atrophy and mild chronic small vessel ischemic changes of the white matter. Stable ventricle size Vascular: No hyperdense vessels.  Carotid vascular calcification Skull: Normal. Negative for fracture or focal lesion. Sinuses/Orbits: No acute finding. Other: None CT CERVICAL SPINE FINDINGS Alignment: Mild reversal of cervical lordosis. 4 mm anterolisthesis C5 on C6, stable compared with prior CT from April. Facet alignment is within normal limits. Skull base and vertebrae: Vertebral body heights are grossly maintained. Again visualized is nondisplaced fracture involving the right posterior arch of C1, with interval sclerotic margin, series 2, image 26, this is chronic compared to the CT from April. No new fracture is seen. Soft tissues and spinal canal: No prevertebral fluid or swelling. No visible canal hematoma. Disc levels: Partial interbody ankylosis C2-C3 and C4-C5 with facet ankylosis at these levels. Diffuse advanced disc space narrowing C2 through T1. Heterogeneous vertebral sclerosis also without change. Hypertrophic facet degenerative changes at multiple levels with multilevel moderate severe foraminal stenosis. Upper  chest: Negative. Other: None IMPRESSION: 1. No CT evidence for acute intracranial abnormality. Atrophy and chronic small vessel ischemic changes of the white matter. 2. Mild reversal of cervical lordosis with chronic 4 mm anterolisthesis C5 on C6. No acute osseous abnormality. Advanced multilevel degenerative changes. 3. Chronic nondisplaced fracture involving the right posterior arch of C1. Electronically Signed   By: Luke Bun M.D.   On: 05/17/2024 18:48   CT Cervical Spine Wo Contrast Result Date: 05/17/2024 CLINICAL DATA:  Head trauma EXAM: CT HEAD WITHOUT CONTRAST CT CERVICAL SPINE WITHOUT CONTRAST TECHNIQUE: Multidetector CT imaging of the head and cervical spine was performed following the standard protocol without intravenous contrast. Multiplanar CT image reconstructions of the cervical spine were also generated. RADIATION DOSE REDUCTION: This exam was performed according to the departmental dose-optimization program which includes automated exposure control, adjustment of the mA and/or kV according to patient size and/or use of iterative reconstruction technique. COMPARISON:  CT brain 05/09/2024 FINDINGS: CT HEAD FINDINGS Brain: No acute territorial infarction, hemorrhage or intracranial mass. Atrophy and mild chronic small vessel ischemic changes of the white matter. Stable ventricle size Vascular: No hyperdense vessels.  Carotid vascular calcification Skull: Normal. Negative for fracture or focal lesion. Sinuses/Orbits: No acute finding. Other: None CT CERVICAL SPINE FINDINGS Alignment: Mild reversal of cervical lordosis. 4 mm anterolisthesis C5 on C6, stable compared with prior CT from April. Facet alignment is within normal limits. Skull base and vertebrae: Vertebral body heights are grossly maintained. Again visualized is nondisplaced fracture involving the right posterior arch of C1, with interval sclerotic margin, series 2, image 26, this is chronic compared to the CT from April. No new  fracture is seen. Soft tissues and spinal canal: No prevertebral fluid or swelling. No visible canal hematoma. Disc levels: Partial interbody ankylosis C2-C3 and C4-C5 with facet ankylosis at these levels. Diffuse advanced disc space narrowing C2 through T1. Heterogeneous vertebral sclerosis also without change. Hypertrophic facet degenerative changes at multiple levels with multilevel moderate severe foraminal stenosis. Upper chest: Negative. Other: None IMPRESSION: 1. No CT evidence for acute intracranial abnormality. Atrophy and chronic small vessel ischemic changes of the white matter. 2. Mild reversal of cervical lordosis with chronic 4 mm anterolisthesis C5 on C6. No acute osseous abnormality. Advanced multilevel degenerative changes. 3. Chronic nondisplaced fracture involving the right posterior arch of C1. Electronically Signed   By: Luke Bun M.D.   On: 05/17/2024 18:48  Scheduled Meds:  amiodarone   200 mg Oral Daily   amLODipine   5 mg Oral Daily   cyanocobalamin   1,000 mcg Oral Daily   DULoxetine   20 mg Oral QHS   feeding supplement  237 mL Oral BID BM   levothyroxine   150 mcg Oral QAC breakfast   metoprolol  succinate  25 mg Oral BID   multivitamin with minerals  1 tablet Oral q AM   pantoprazole   40 mg Oral Daily   simvastatin   20 mg Oral Daily   sodium bicarbonate   650 mg Oral TID   tamsulosin   0.4 mg Oral Daily   Continuous Infusions:  promethazine  (PHENERGAN ) injection (IM or IVPB) Stopped (05/18/24 1845)     LOS: 1 day  MDM: Patient is high risk for one or more organ failure.  They necessitate ongoing hospitalization for continued IV therapies and subsequent lab monitoring. Total time spent interpreting labs and vitals, reviewing the medical record, coordinating care amongst consultants and care team members, directly assessing and discussing care with the patient and/or family: 55 min  Zenola Dezarn, DO [facility]  To contact the attending physician between  7A-7P please use Epic Chat. To contact the covering physician during after hours 7P-7A, please review Amion.  05/19/2024, 12:20 PM   *This document has been created with the assistance of dictation software. Please excuse typographical errors. *

## 2024-05-19 NOTE — Consult Note (Signed)
 [facility] Vascular Consult Note  MRN : [id number]  [name] is a 83 y.o. (06/08/1940) male who presents with chief complaint of  Chief Complaint  Patient presents with   Fall  .  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 Now with worsening renal function- requiring HD.  Current Facility-Administered Medications  Medication Dose Route Frequency Provider Last Rate Last Admin   acetaminophen  (TYLENOL ) tablet 650 mg  650 mg Oral Q6H PRN Niu, Xilin, MD   650 mg at 05/18/24 0118   amiodarone  (PACERONE ) tablet 200 mg  200 mg Oral Daily Niu, Xilin, MD   200 mg at 05/19/24 0811   amLODipine  (NORVASC ) tablet 5 mg  5 mg Oral Daily Niu, Xilin, MD   5 mg at 05/19/24 9188   cyanocobalamin  (VITAMIN B12) tablet 1,000 mcg  1,000 mcg Oral Daily Niu, Xilin, MD   1,000 mcg at 05/19/24 9191   DULoxetine  (CYMBALTA ) DR capsule 20 mg  20 mg Oral QHS Niu, Xilin, MD   20 mg at 05/18/24 2146   feeding supplement (ENSURE PLUS HIGH PROTEIN) liquid 237 mL  237 mL Oral BID BM Niu, Xilin, MD   237 mL at 05/19/24 9187   hydrALAZINE  (APRESOLINE ) injection 10 mg  10 mg Intravenous Q2H PRN Niu, Xilin, MD   10 mg at 05/18/24 9681   levothyroxine  (SYNTHROID ) tablet 150 mcg  150 mcg Oral QAC breakfast Niu, Xilin, MD   150 mcg at 05/19/24 0533   metoprolol  succinate (TOPROL -XL) 24 hr tablet 25 mg  25 mg Oral BID Niu, Xilin, MD   25 mg at 05/19/24 0811   morphine  (PF) 2 MG/ML injection 2 mg  2 mg Intravenous Q4H PRN Mansy, Jan A, MD   2 mg at 05/18/24 1514   multivitamin with minerals tablet 1 tablet  1 tablet Oral q AM Niu, Xilin, MD   1 tablet at 05/19/24 9192   ondansetron  (ZOFRAN ) injection 4 mg  4 mg Intravenous Q8H PRN Niu, Xilin, MD   4 mg at 05/18/24 1619   pantoprazole  (PROTONIX ) EC tablet 40 mg  40 mg Oral Daily Niu, Xilin, MD   40 mg at 05/19/24 9191   promethazine  (PHENERGAN ) 12.5 mg in sodium chloride  0.9 % 50 mL IVPB  12.5 mg Intravenous Q6H PRN Jens Durand, MD   Stopped at 05/18/24 1845   simvastatin  (ZOCOR ) tablet 20 mg  20 mg Oral Daily Ayiku, Bernard, MD   20 mg at 05/19/24 9191   sodium bicarbonate  tablet 650 mg  650 mg Oral TID Niu, Xilin, MD   650 mg at 05/19/24 9191   tamsulosin  (FLOMAX ) capsule 0.4 mg  0.4 mg Oral Daily Niu, Xilin, MD   0.4 mg at 05/19/24 9191   traMADol  (ULTRAM ) tablet 50 mg  50 mg Oral Q6H PRN Niu, Xilin, MD   50 mg at 05/18/24 0318   traZODone  (DESYREL ) tablet 25 mg  25 mg Oral QHS PRN Mansy, Jan A, MD   25 mg at 05/18/24 2146   Facility-Administered Medications Ordered in Other Encounters  Medication Dose Route Frequency Provider Last Rate Last Admin   epoetin  alfa-epbx (RETACRIT ) injection 10,000 Units  10,000 Units Subcutaneous Once Rao, Archana C, MD       epoetin  alfa-epbx (RETACRIT ) injection 40,000 Units  40,000 Units Subcutaneous Once Melanee Annah BROCKS, MD        Past Medical History:  Diagnosis Date   Arthritis  Benign hypertensive renal disease    Biceps tendon rupture, right, initial encounter    COVID-19    GERD (gastroesophageal reflux disease)    Heartburn    History of kidney stones    History of retinal detachment    Hyperlipidemia    Hypertension    Hypothyroidism    Infraspinatus tendon tear, right, initial encounter    Melanoma (HCC)    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 approximately 10-15 years ago   Myelofibrosis (HCC)    Prostate hypertrophy    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   right forearm, [facility]   Stroke (HCC) 11/2007   R brain subcortical infarct    Past Surgical History:  Procedure Laterality Date   ASPIRATION / INJECTION RENAL CYST  07/08/2015   BACK SURGERY     approx 20- 25 years ago   COLONOSCOPY     EYE SURGERY     cataract both eyes   GAS INSERTION  08/11/2011   Procedure: INSERTION OF GAS;  Surgeon: Norleen JONETTA Ku, MD;  Location: [facility] OR;  Service: Ophthalmology;  Laterality: Right;  [id number]   IR BONE MARROW BIOPSY & ASPIRATION  08/03/2023    IR FLUORO GUIDE CV LINE RIGHT  10/23/2023   IR REMOVAL TUN CV CATH W/O FL  11/07/2023   IR THORACENTESIS ASP PLEURAL SPACE W/IMG GUIDE  09/22/2023   IR THORACENTESIS ASP PLEURAL SPACE W/IMG GUIDE  10/02/2023   LEFT HEART CATH AND CORONARY ANGIOGRAPHY N/A 05/02/2023   Procedure: LEFT HEART CATH AND CORONARY ANGIOGRAPHY;  Surgeon: Mady Bruckner, MD;  Location: [facility] INVASIVE CV LAB;  Service: Cardiovascular;  Laterality: N/A;   REVERSE SHOULDER ARTHROPLASTY Right 04/13/2023   Procedure: REVERSE SHOULDER ARTHROPLASTY;  Surgeon: Edie Norleen PARAS, MD;  Location: [facility] ORS;  Service: Orthopedics;  Laterality: Right;   SCLERAL BUCKLE  08/11/2011   Procedure: SCLERAL BUCKLE;  Surgeon: Norleen JONETTA Ku, MD;  Location: [facility] OR;  Service: Ophthalmology;  Laterality: Right;   VARICOSE VEIN SURGERY      Social History Social History[1]  Family History Family History  Problem Relation Age of Onset   Heart disease Father    Dementia Sister    Diabetes Son    Kidney disease Neg Hx    Prostate cancer Neg Hx     Allergies[2]   REVIEW OF SYSTEMS (Negative unless checked)  Constitutional: [] Weight loss  [] Fever  [] Chills Cardiac: [] Chest pain   [] Chest pressure   [x] Palpitations   [] Shortness of breath when laying flat   [] Shortness of breath at rest   [] Shortness of breath with exertion. Vascular:  [] Pain in legs with walking   [] Pain in legs at rest   [] Pain in legs when laying flat   [] Claudication   [] Pain in feet when walking  [] Pain in feet at rest  [] Pain in feet when laying flat   [] History of DVT   [] Phlebitis   [] Swelling in legs   [] Varicose veins   [] Non-healing ulcers Pulmonary:   [] Uses home oxygen   [] Productive cough   [] Hemoptysis   [] Wheeze  [] COPD   [] Asthma Neurologic:  [] Dizziness  [] Blackouts   [] Seizures   [] History of stroke   [x] History of TIA  [] Aphasia   [] Temporary blindness   [] Dysphagia   [] Weakness or numbness in arms   [] Weakness or numbness in legs Musculoskeletal:  [] Arthritis    [] Joint swelling   [] Joint pain   [] Low back pain Hematologic:  [] Easy bruising  [] Easy bleeding   []   Hypercoagulable state   [] Anemic  [] Hepatitis Gastrointestinal:  [] Blood in stool   [] Vomiting blood  [] Gastroesophageal reflux/heartburn   [] Difficulty swallowing. Genitourinary:  [x] Chronic kidney disease   [] Difficult urination  [] Frequent urination  [] Burning with urination   [] Blood in urine Skin:  [] Rashes   [] Ulcers   [] Wounds Psychological:  [] History of anxiety   []  History of major depression.  Physical Examination  Vitals:   05/18/24 2324 05/19/24 0500 05/19/24 0504 05/19/24 0811  BP: (!) 153/92  (!) 165/86 (!) 167/98  Pulse: 62  60 (!) 58  Resp: 18  17 17   Temp: 98.2 F (36.8 C)  97.8 F (36.6 C) 98.1 F (36.7 C)  TempSrc:      SpO2: 96%  96% 95%  Weight:  69.1 kg    Height:       Body mass index is 21.25 kg/m. Gen:  WD/WN, NAD Head: [place]/AT, No temporalis wasting. Prominent temp pulse not noted. Ear/Nose/Throat: Hearing grossly intact, nares w/o erythema or drainage, oropharynx w/o Erythema/Exudate Eyes: Sclera non-icteric, conjunctiva clear Neck: Trachea midline.  No JVD.  Pulmonary:  Good air movement, respirations not labored, equal bilaterally.  Cardiac: RRR, normal S1, S2. Vascular:  Vessel Right Left  Radial Palpable Palpable  Ulnar    Brachial    Carotid    Aorta Not palpable N/A  Femoral Palpable Palpable  Popliteal    PT    DP     Gastrointestinal: soft, non-tender/non-distended. No guarding/reflex.  Musculoskeletal: M/S 5/5 throughout.  Extremities without ischemic changes.  No deformity or atrophy. No edema. Neurologic: Sensation grossly intact in extremities.  Symmetrical.  Speech is fluent. Motor exam as listed above. Psychiatric: Judgment intact, Mood & affect appropriate for pt's clinical situation. Dermatologic: No rashes or ulcers noted.  No cellulitis or open wounds. Lymph : No Cervical, Axillary, or Inguinal  lymphadenopathy.     CBC Lab Results  Component Value Date   WBC 11.3 (H) 05/19/2024   HGB 9.0 (L) 05/19/2024   HCT 28.7 (L) 05/19/2024   MCV 96.6 05/19/2024   PLT 272 05/19/2024    BMET    Component Value Date/Time   NA 140 05/19/2024 0522   NA 138 04/10/2024 1201   NA 141 06/18/2014 1015   K 6.5 (HH) 05/19/2024 0522   K 4.5 06/18/2014 1015   CL 105 05/19/2024 0522   CL 107 06/18/2014 1015   CO2 22 05/19/2024 0522   CO2 26 06/18/2014 1015   GLUCOSE 83 05/19/2024 0522   GLUCOSE 160 (H) 06/18/2014 1015   BUN 73 (H) 05/19/2024 0522   BUN 62 (H) 04/10/2024 1201   BUN 20 (H) 06/18/2014 1015   CREATININE 5.03 (H) 05/19/2024 0522   CREATININE 5.48 (H) 05/08/2024 0936   CREATININE 1.09 06/18/2014 1015   CALCIUM  8.8 (L) 05/19/2024 0522   CALCIUM  8.5 06/18/2014 1015   GFRNONAA 11 (L) 05/19/2024 0522   GFRNONAA 10 (L) 05/08/2024 0936   GFRNONAA >60 06/18/2014 1015   GFRAA 55 (L) 05/25/2020 1500   GFRAA >60 06/18/2014 1015   Estimated Creatinine Clearance: 10.9 mL/min (A) (by C-G formula based on SCr of 5.03 mg/dL (H)).  COAG Lab Results  Component Value Date   INR 1.1 05/09/2024   INR 1.4 (H) 09/21/2023   INR 1.4 (H) 05/03/2023    Radiology DG Forearm Left Result Date: 05/17/2024 CLINICAL DATA:  Fall, laceration EXAM: DG FOREARM 2V*L* COMPARISON:  None Available. FINDINGS: No definitive fracture or malalignment. Moderate degenerative change at the  radiocarpal joint. Prominent cysts in the carpal bones. Soft tissue swelling at the wrist. IMPRESSION: No acute osseous abnormality. Electronically Signed   By: Luke Bun M.D.   On: 05/17/2024 18:49   CT Head Wo Contrast Result Date: 05/17/2024 CLINICAL DATA:  Head trauma EXAM: CT HEAD WITHOUT CONTRAST CT CERVICAL SPINE WITHOUT CONTRAST TECHNIQUE: Multidetector CT imaging of the head and cervical spine was performed following the standard protocol without intravenous contrast. Multiplanar CT image reconstructions of the  cervical spine were also generated. RADIATION DOSE REDUCTION: This exam was performed according to the departmental dose-optimization program which includes automated exposure control, adjustment of the mA and/or kV according to patient size and/or use of iterative reconstruction technique. COMPARISON:  CT brain 05/09/2024 FINDINGS: CT HEAD FINDINGS Brain: No acute territorial infarction, hemorrhage or intracranial mass. Atrophy and mild chronic small vessel ischemic changes of the white matter. Stable ventricle size Vascular: No hyperdense vessels.  Carotid vascular calcification Skull: Normal. Negative for fracture or focal lesion. Sinuses/Orbits: No acute finding. Other: None CT CERVICAL SPINE FINDINGS Alignment: Mild reversal of cervical lordosis. 4 mm anterolisthesis C5 on C6, stable compared with prior CT from April. Facet alignment is within normal limits. Skull base and vertebrae: Vertebral body heights are grossly maintained. Again visualized is nondisplaced fracture involving the right posterior arch of C1, with interval sclerotic margin, series 2, image 26, this is chronic compared to the CT from April. No new fracture is seen. Soft tissues and spinal canal: No prevertebral fluid or swelling. No visible canal hematoma. Disc levels: Partial interbody ankylosis C2-C3 and C4-C5 with facet ankylosis at these levels. Diffuse advanced disc space narrowing C2 through T1. Heterogeneous vertebral sclerosis also without change. Hypertrophic facet degenerative changes at multiple levels with multilevel moderate severe foraminal stenosis. Upper chest: Negative. Other: None IMPRESSION: 1. No CT evidence for acute intracranial abnormality. Atrophy and chronic small vessel ischemic changes of the white matter. 2. Mild reversal of cervical lordosis with chronic 4 mm anterolisthesis C5 on C6. No acute osseous abnormality. Advanced multilevel degenerative changes. 3. Chronic nondisplaced fracture involving the right  posterior arch of C1. Electronically Signed   By: Luke Bun M.D.   On: 05/17/2024 18:48   CT Cervical Spine Wo Contrast Result Date: 05/17/2024 CLINICAL DATA:  Head trauma EXAM: CT HEAD WITHOUT CONTRAST CT CERVICAL SPINE WITHOUT CONTRAST TECHNIQUE: Multidetector CT imaging of the head and cervical spine was performed following the standard protocol without intravenous contrast. Multiplanar CT image reconstructions of the cervical spine were also generated. RADIATION DOSE REDUCTION: This exam was performed according to the departmental dose-optimization program which includes automated exposure control, adjustment of the mA and/or kV according to patient size and/or use of iterative reconstruction technique. COMPARISON:  CT brain 05/09/2024 FINDINGS: CT HEAD FINDINGS Brain: No acute territorial infarction, hemorrhage or intracranial mass. Atrophy and mild chronic small vessel ischemic changes of the white matter. Stable ventricle size Vascular: No hyperdense vessels.  Carotid vascular calcification Skull: Normal. Negative for fracture or focal lesion. Sinuses/Orbits: No acute finding. Other: None CT CERVICAL SPINE FINDINGS Alignment: Mild reversal of cervical lordosis. 4 mm anterolisthesis C5 on C6, stable compared with prior CT from April. Facet alignment is within normal limits. Skull base and vertebrae: Vertebral body heights are grossly maintained. Again visualized is nondisplaced fracture involving the right posterior arch of C1, with interval sclerotic margin, series 2, image 26, this is chronic compared to the CT from April. No new fracture is seen. Soft tissues and spinal canal:  No prevertebral fluid or swelling. No visible canal hematoma. Disc levels: Partial interbody ankylosis C2-C3 and C4-C5 with facet ankylosis at these levels. Diffuse advanced disc space narrowing C2 through T1. Heterogeneous vertebral sclerosis also without change. Hypertrophic facet degenerative changes at multiple levels with  multilevel moderate severe foraminal stenosis. Upper chest: Negative. Other: None IMPRESSION: 1. No CT evidence for acute intracranial abnormality. Atrophy and chronic small vessel ischemic changes of the white matter. 2. Mild reversal of cervical lordosis with chronic 4 mm anterolisthesis C5 on C6. No acute osseous abnormality. Advanced multilevel degenerative changes. 3. Chronic nondisplaced fracture involving the right posterior arch of C1. Electronically Signed   By: Luke Bun M.D.   On: 05/17/2024 18:48   CT HEAD WO CONTRAST Result Date: 05/09/2024 EXAM: CT HEAD WITHOUT CONTRAST 05/09/2024 03:46:04 PM TECHNIQUE: CT of the head was performed without the administration of intravenous contrast. Automated exposure control, iterative reconstruction, and/or weight based adjustment of the mA/kV was utilized to reduce the radiation dose to as low as reasonably achievable. COMPARISON: CT of the head dated 06/26/2023. CLINICAL HISTORY: htn dizzy FINDINGS: BRAIN AND VENTRICLES: No acute hemorrhage. No evidence of acute infarct. No hydrocephalus. No extra-axial collection. No mass effect or midline shift. There is age-related volume loss and mild periventricular white matter disease. There is moderate calcification within the carotid siphons. ORBITS: The patient is status post bilateral lens replacement and right-sided scleral banding. SINUSES: No acute abnormality. SOFT TISSUES AND SKULL: No acute soft tissue abnormality. No skull fracture. IMPRESSION: 1. No acute intracranial abnormality. 2. Age-related volume loss and mild periventricular white matter disease. 3. Moderate calcification within the carotid siphons. Electronically signed by: Evalene Coho MD 05/09/2024 03:57 PM EST RP Workstation: [id number]      Assessment/Plan CKD V now requiring HD Will plan for Permacath placement tomorrow NPO after MN .    Tisa Curry LABOR, MD  05/19/2024 11:55 AM    This note was created with Dragon  medical transcription system.  Any error is purely unintentional      [1]  Social History Tobacco Use   Smoking status: Former    Current packs/day: 0.00    Average packs/day: 0.3 packs/day    Types: Cigarettes    Quit date: 06/06/2010    Years since quitting: 13.9   Smokeless tobacco: Never  Vaping Use   Vaping status: Never Used  Substance Use Topics   Alcohol use: No    Alcohol/week: 0.0 standard drinks of alcohol   Drug use: No  [2]  Allergies Allergen Reactions   Quinolones Other (See Comments)    Patient with aortic aneurysm. Use of quinolones are considered to be contraindicated due to risk of aortic rupture.   Meloxicam Nausea And Vomiting

## 2024-05-19 NOTE — H&P (View-Only) (Signed)
 [facility] Vascular Consult Note  MRN : [id number]  [name] is a 83 y.o. (06/08/1940) male who presents with chief complaint of  Chief Complaint  Patient presents with   Fall  .  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 Now with worsening renal function- requiring HD.  Current Facility-Administered Medications  Medication Dose Route Frequency Provider Last Rate Last Admin   acetaminophen  (TYLENOL ) tablet 650 mg  650 mg Oral Q6H PRN Niu, Xilin, MD   650 mg at 05/18/24 0118   amiodarone  (PACERONE ) tablet 200 mg  200 mg Oral Daily Niu, Xilin, MD   200 mg at 05/19/24 0811   amLODipine  (NORVASC ) tablet 5 mg  5 mg Oral Daily Niu, Xilin, MD   5 mg at 05/19/24 9188   cyanocobalamin  (VITAMIN B12) tablet 1,000 mcg  1,000 mcg Oral Daily Niu, Xilin, MD   1,000 mcg at 05/19/24 9191   DULoxetine  (CYMBALTA ) DR capsule 20 mg  20 mg Oral QHS Niu, Xilin, MD   20 mg at 05/18/24 2146   feeding supplement (ENSURE PLUS HIGH PROTEIN) liquid 237 mL  237 mL Oral BID BM Niu, Xilin, MD   237 mL at 05/19/24 9187   hydrALAZINE  (APRESOLINE ) injection 10 mg  10 mg Intravenous Q2H PRN Niu, Xilin, MD   10 mg at 05/18/24 9681   levothyroxine  (SYNTHROID ) tablet 150 mcg  150 mcg Oral QAC breakfast Niu, Xilin, MD   150 mcg at 05/19/24 0533   metoprolol  succinate (TOPROL -XL) 24 hr tablet 25 mg  25 mg Oral BID Niu, Xilin, MD   25 mg at 05/19/24 0811   morphine  (PF) 2 MG/ML injection 2 mg  2 mg Intravenous Q4H PRN Mansy, Jan A, MD   2 mg at 05/18/24 1514   multivitamin with minerals tablet 1 tablet  1 tablet Oral q AM Niu, Xilin, MD   1 tablet at 05/19/24 9192   ondansetron  (ZOFRAN ) injection 4 mg  4 mg Intravenous Q8H PRN Niu, Xilin, MD   4 mg at 05/18/24 1619   pantoprazole  (PROTONIX ) EC tablet 40 mg  40 mg Oral Daily Niu, Xilin, MD   40 mg at 05/19/24 9191   promethazine  (PHENERGAN ) 12.5 mg in sodium chloride  0.9 % 50 mL IVPB  12.5 mg Intravenous Q6H PRN Jens Durand, MD   Stopped at 05/18/24 1845   simvastatin  (ZOCOR ) tablet 20 mg  20 mg Oral Daily Ayiku, Bernard, MD   20 mg at 05/19/24 9191   sodium bicarbonate  tablet 650 mg  650 mg Oral TID Niu, Xilin, MD   650 mg at 05/19/24 9191   tamsulosin  (FLOMAX ) capsule 0.4 mg  0.4 mg Oral Daily Niu, Xilin, MD   0.4 mg at 05/19/24 9191   traMADol  (ULTRAM ) tablet 50 mg  50 mg Oral Q6H PRN Niu, Xilin, MD   50 mg at 05/18/24 0318   traZODone  (DESYREL ) tablet 25 mg  25 mg Oral QHS PRN Mansy, Jan A, MD   25 mg at 05/18/24 2146   Facility-Administered Medications Ordered in Other Encounters  Medication Dose Route Frequency Provider Last Rate Last Admin   epoetin  alfa-epbx (RETACRIT ) injection 10,000 Units  10,000 Units Subcutaneous Once Rao, Archana C, MD       epoetin  alfa-epbx (RETACRIT ) injection 40,000 Units  40,000 Units Subcutaneous Once Melanee Annah BROCKS, MD        Past Medical History:  Diagnosis Date   Arthritis  Benign hypertensive renal disease    Biceps tendon rupture, right, initial encounter    COVID-19    GERD (gastroesophageal reflux disease)    Heartburn    History of kidney stones    History of retinal detachment    Hyperlipidemia    Hypertension    Hypothyroidism    Infraspinatus tendon tear, right, initial encounter    Melanoma (HCC)    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 approximately 10-15 years ago   Myelofibrosis (HCC)    Prostate hypertrophy    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   right forearm, [facility]   Stroke (HCC) 11/2007   R brain subcortical infarct    Past Surgical History:  Procedure Laterality Date   ASPIRATION / INJECTION RENAL CYST  07/08/2015   BACK SURGERY     approx 20- 25 years ago   COLONOSCOPY     EYE SURGERY     cataract both eyes   GAS INSERTION  08/11/2011   Procedure: INSERTION OF GAS;  Surgeon: Norleen JONETTA Ku, MD;  Location: [facility] OR;  Service: Ophthalmology;  Laterality: Right;  [id number]   IR BONE MARROW BIOPSY & ASPIRATION  08/03/2023    IR FLUORO GUIDE CV LINE RIGHT  10/23/2023   IR REMOVAL TUN CV CATH W/O FL  11/07/2023   IR THORACENTESIS ASP PLEURAL SPACE W/IMG GUIDE  09/22/2023   IR THORACENTESIS ASP PLEURAL SPACE W/IMG GUIDE  10/02/2023   LEFT HEART CATH AND CORONARY ANGIOGRAPHY N/A 05/02/2023   Procedure: LEFT HEART CATH AND CORONARY ANGIOGRAPHY;  Surgeon: Mady Bruckner, MD;  Location: [facility] INVASIVE CV LAB;  Service: Cardiovascular;  Laterality: N/A;   REVERSE SHOULDER ARTHROPLASTY Right 04/13/2023   Procedure: REVERSE SHOULDER ARTHROPLASTY;  Surgeon: Edie Norleen PARAS, MD;  Location: [facility] ORS;  Service: Orthopedics;  Laterality: Right;   SCLERAL BUCKLE  08/11/2011   Procedure: SCLERAL BUCKLE;  Surgeon: Norleen JONETTA Ku, MD;  Location: [facility] OR;  Service: Ophthalmology;  Laterality: Right;   VARICOSE VEIN SURGERY      Social History Social History[1]  Family History Family History  Problem Relation Age of Onset   Heart disease Father    Dementia Sister    Diabetes Son    Kidney disease Neg Hx    Prostate cancer Neg Hx     Allergies[2]   REVIEW OF SYSTEMS (Negative unless checked)  Constitutional: [] Weight loss  [] Fever  [] Chills Cardiac: [] Chest pain   [] Chest pressure   [x] Palpitations   [] Shortness of breath when laying flat   [] Shortness of breath at rest   [] Shortness of breath with exertion. Vascular:  [] Pain in legs with walking   [] Pain in legs at rest   [] Pain in legs when laying flat   [] Claudication   [] Pain in feet when walking  [] Pain in feet at rest  [] Pain in feet when laying flat   [] History of DVT   [] Phlebitis   [] Swelling in legs   [] Varicose veins   [] Non-healing ulcers Pulmonary:   [] Uses home oxygen   [] Productive cough   [] Hemoptysis   [] Wheeze  [] COPD   [] Asthma Neurologic:  [] Dizziness  [] Blackouts   [] Seizures   [] History of stroke   [x] History of TIA  [] Aphasia   [] Temporary blindness   [] Dysphagia   [] Weakness or numbness in arms   [] Weakness or numbness in legs Musculoskeletal:  [] Arthritis    [] Joint swelling   [] Joint pain   [] Low back pain Hematologic:  [] Easy bruising  [] Easy bleeding   []   Hypercoagulable state   [] Anemic  [] Hepatitis Gastrointestinal:  [] Blood in stool   [] Vomiting blood  [] Gastroesophageal reflux/heartburn   [] Difficulty swallowing. Genitourinary:  [x] Chronic kidney disease   [] Difficult urination  [] Frequent urination  [] Burning with urination   [] Blood in urine Skin:  [] Rashes   [] Ulcers   [] Wounds Psychological:  [] History of anxiety   []  History of major depression.  Physical Examination  Vitals:   05/18/24 2324 05/19/24 0500 05/19/24 0504 05/19/24 0811  BP: (!) 153/92  (!) 165/86 (!) 167/98  Pulse: 62  60 (!) 58  Resp: 18  17 17   Temp: 98.2 F (36.8 C)  97.8 F (36.6 C) 98.1 F (36.7 C)  TempSrc:      SpO2: 96%  96% 95%  Weight:  69.1 kg    Height:       Body mass index is 21.25 kg/m. Gen:  WD/WN, NAD Head: [place]/AT, No temporalis wasting. Prominent temp pulse not noted. Ear/Nose/Throat: Hearing grossly intact, nares w/o erythema or drainage, oropharynx w/o Erythema/Exudate Eyes: Sclera non-icteric, conjunctiva clear Neck: Trachea midline.  No JVD.  Pulmonary:  Good air movement, respirations not labored, equal bilaterally.  Cardiac: RRR, normal S1, S2. Vascular:  Vessel Right Left  Radial Palpable Palpable  Ulnar    Brachial    Carotid    Aorta Not palpable N/A  Femoral Palpable Palpable  Popliteal    PT    DP     Gastrointestinal: soft, non-tender/non-distended. No guarding/reflex.  Musculoskeletal: M/S 5/5 throughout.  Extremities without ischemic changes.  No deformity or atrophy. No edema. Neurologic: Sensation grossly intact in extremities.  Symmetrical.  Speech is fluent. Motor exam as listed above. Psychiatric: Judgment intact, Mood & affect appropriate for pt's clinical situation. Dermatologic: No rashes or ulcers noted.  No cellulitis or open wounds. Lymph : No Cervical, Axillary, or Inguinal  lymphadenopathy.     CBC Lab Results  Component Value Date   WBC 11.3 (H) 05/19/2024   HGB 9.0 (L) 05/19/2024   HCT 28.7 (L) 05/19/2024   MCV 96.6 05/19/2024   PLT 272 05/19/2024    BMET    Component Value Date/Time   NA 140 05/19/2024 0522   NA 138 04/10/2024 1201   NA 141 06/18/2014 1015   K 6.5 (HH) 05/19/2024 0522   K 4.5 06/18/2014 1015   CL 105 05/19/2024 0522   CL 107 06/18/2014 1015   CO2 22 05/19/2024 0522   CO2 26 06/18/2014 1015   GLUCOSE 83 05/19/2024 0522   GLUCOSE 160 (H) 06/18/2014 1015   BUN 73 (H) 05/19/2024 0522   BUN 62 (H) 04/10/2024 1201   BUN 20 (H) 06/18/2014 1015   CREATININE 5.03 (H) 05/19/2024 0522   CREATININE 5.48 (H) 05/08/2024 0936   CREATININE 1.09 06/18/2014 1015   CALCIUM  8.8 (L) 05/19/2024 0522   CALCIUM  8.5 06/18/2014 1015   GFRNONAA 11 (L) 05/19/2024 0522   GFRNONAA 10 (L) 05/08/2024 0936   GFRNONAA >60 06/18/2014 1015   GFRAA 55 (L) 05/25/2020 1500   GFRAA >60 06/18/2014 1015   Estimated Creatinine Clearance: 10.9 mL/min (A) (by C-G formula based on SCr of 5.03 mg/dL (H)).  COAG Lab Results  Component Value Date   INR 1.1 05/09/2024   INR 1.4 (H) 09/21/2023   INR 1.4 (H) 05/03/2023    Radiology DG Forearm Left Result Date: 05/17/2024 CLINICAL DATA:  Fall, laceration EXAM: DG FOREARM 2V*L* COMPARISON:  None Available. FINDINGS: No definitive fracture or malalignment. Moderate degenerative change at the  radiocarpal joint. Prominent cysts in the carpal bones. Soft tissue swelling at the wrist. IMPRESSION: No acute osseous abnormality. Electronically Signed   By: Luke Bun M.D.   On: 05/17/2024 18:49   CT Head Wo Contrast Result Date: 05/17/2024 CLINICAL DATA:  Head trauma EXAM: CT HEAD WITHOUT CONTRAST CT CERVICAL SPINE WITHOUT CONTRAST TECHNIQUE: Multidetector CT imaging of the head and cervical spine was performed following the standard protocol without intravenous contrast. Multiplanar CT image reconstructions of the  cervical spine were also generated. RADIATION DOSE REDUCTION: This exam was performed according to the departmental dose-optimization program which includes automated exposure control, adjustment of the mA and/or kV according to patient size and/or use of iterative reconstruction technique. COMPARISON:  CT brain 05/09/2024 FINDINGS: CT HEAD FINDINGS Brain: No acute territorial infarction, hemorrhage or intracranial mass. Atrophy and mild chronic small vessel ischemic changes of the white matter. Stable ventricle size Vascular: No hyperdense vessels.  Carotid vascular calcification Skull: Normal. Negative for fracture or focal lesion. Sinuses/Orbits: No acute finding. Other: None CT CERVICAL SPINE FINDINGS Alignment: Mild reversal of cervical lordosis. 4 mm anterolisthesis C5 on C6, stable compared with prior CT from April. Facet alignment is within normal limits. Skull base and vertebrae: Vertebral body heights are grossly maintained. Again visualized is nondisplaced fracture involving the right posterior arch of C1, with interval sclerotic margin, series 2, image 26, this is chronic compared to the CT from April. No new fracture is seen. Soft tissues and spinal canal: No prevertebral fluid or swelling. No visible canal hematoma. Disc levels: Partial interbody ankylosis C2-C3 and C4-C5 with facet ankylosis at these levels. Diffuse advanced disc space narrowing C2 through T1. Heterogeneous vertebral sclerosis also without change. Hypertrophic facet degenerative changes at multiple levels with multilevel moderate severe foraminal stenosis. Upper chest: Negative. Other: None IMPRESSION: 1. No CT evidence for acute intracranial abnormality. Atrophy and chronic small vessel ischemic changes of the white matter. 2. Mild reversal of cervical lordosis with chronic 4 mm anterolisthesis C5 on C6. No acute osseous abnormality. Advanced multilevel degenerative changes. 3. Chronic nondisplaced fracture involving the right  posterior arch of C1. Electronically Signed   By: Luke Bun M.D.   On: 05/17/2024 18:48   CT Cervical Spine Wo Contrast Result Date: 05/17/2024 CLINICAL DATA:  Head trauma EXAM: CT HEAD WITHOUT CONTRAST CT CERVICAL SPINE WITHOUT CONTRAST TECHNIQUE: Multidetector CT imaging of the head and cervical spine was performed following the standard protocol without intravenous contrast. Multiplanar CT image reconstructions of the cervical spine were also generated. RADIATION DOSE REDUCTION: This exam was performed according to the departmental dose-optimization program which includes automated exposure control, adjustment of the mA and/or kV according to patient size and/or use of iterative reconstruction technique. COMPARISON:  CT brain 05/09/2024 FINDINGS: CT HEAD FINDINGS Brain: No acute territorial infarction, hemorrhage or intracranial mass. Atrophy and mild chronic small vessel ischemic changes of the white matter. Stable ventricle size Vascular: No hyperdense vessels.  Carotid vascular calcification Skull: Normal. Negative for fracture or focal lesion. Sinuses/Orbits: No acute finding. Other: None CT CERVICAL SPINE FINDINGS Alignment: Mild reversal of cervical lordosis. 4 mm anterolisthesis C5 on C6, stable compared with prior CT from April. Facet alignment is within normal limits. Skull base and vertebrae: Vertebral body heights are grossly maintained. Again visualized is nondisplaced fracture involving the right posterior arch of C1, with interval sclerotic margin, series 2, image 26, this is chronic compared to the CT from April. No new fracture is seen. Soft tissues and spinal canal:  No prevertebral fluid or swelling. No visible canal hematoma. Disc levels: Partial interbody ankylosis C2-C3 and C4-C5 with facet ankylosis at these levels. Diffuse advanced disc space narrowing C2 through T1. Heterogeneous vertebral sclerosis also without change. Hypertrophic facet degenerative changes at multiple levels with  multilevel moderate severe foraminal stenosis. Upper chest: Negative. Other: None IMPRESSION: 1. No CT evidence for acute intracranial abnormality. Atrophy and chronic small vessel ischemic changes of the white matter. 2. Mild reversal of cervical lordosis with chronic 4 mm anterolisthesis C5 on C6. No acute osseous abnormality. Advanced multilevel degenerative changes. 3. Chronic nondisplaced fracture involving the right posterior arch of C1. Electronically Signed   By: Luke Bun M.D.   On: 05/17/2024 18:48   CT HEAD WO CONTRAST Result Date: 05/09/2024 EXAM: CT HEAD WITHOUT CONTRAST 05/09/2024 03:46:04 PM TECHNIQUE: CT of the head was performed without the administration of intravenous contrast. Automated exposure control, iterative reconstruction, and/or weight based adjustment of the mA/kV was utilized to reduce the radiation dose to as low as reasonably achievable. COMPARISON: CT of the head dated 06/26/2023. CLINICAL HISTORY: htn dizzy FINDINGS: BRAIN AND VENTRICLES: No acute hemorrhage. No evidence of acute infarct. No hydrocephalus. No extra-axial collection. No mass effect or midline shift. There is age-related volume loss and mild periventricular white matter disease. There is moderate calcification within the carotid siphons. ORBITS: The patient is status post bilateral lens replacement and right-sided scleral banding. SINUSES: No acute abnormality. SOFT TISSUES AND SKULL: No acute soft tissue abnormality. No skull fracture. IMPRESSION: 1. No acute intracranial abnormality. 2. Age-related volume loss and mild periventricular white matter disease. 3. Moderate calcification within the carotid siphons. Electronically signed by: Evalene Coho MD 05/09/2024 03:57 PM EST RP Workstation: [id number]      Assessment/Plan CKD V now requiring HD Will plan for Permacath placement tomorrow NPO after MN .    Tisa Curry LABOR, MD  05/19/2024 11:55 AM    This note was created with Dragon  medical transcription system.  Any error is purely unintentional      [1]  Social History Tobacco Use   Smoking status: Former    Current packs/day: 0.00    Average packs/day: 0.3 packs/day    Types: Cigarettes    Quit date: 06/06/2010    Years since quitting: 13.9   Smokeless tobacco: Never  Vaping Use   Vaping status: Never Used  Substance Use Topics   Alcohol use: No    Alcohol/week: 0.0 standard drinks of alcohol   Drug use: No  [2]  Allergies Allergen Reactions   Quinolones Other (See Comments)    Patient with aortic aneurysm. Use of quinolones are considered to be contraindicated due to risk of aortic rupture.   Meloxicam Nausea And Vomiting

## 2024-05-20 ENCOUNTER — Encounter: Payer: Self-pay | Attending: Internal Medicine

## 2024-05-20 ENCOUNTER — Telehealth: Payer: Self-pay | Admitting: Medical

## 2024-05-20 ENCOUNTER — Inpatient Hospital Stay

## 2024-05-20 ENCOUNTER — Encounter: Payer: Self-pay | Admitting: Vascular Surgery

## 2024-05-20 DIAGNOSIS — Z96611 Presence of right artificial shoulder joint: Secondary | ICD-10-CM | POA: Diagnosis not present

## 2024-05-20 DIAGNOSIS — J90 Pleural effusion, not elsewhere classified: Secondary | ICD-10-CM | POA: Diagnosis not present

## 2024-05-20 DIAGNOSIS — R918 Other nonspecific abnormal finding of lung field: Secondary | ICD-10-CM | POA: Diagnosis not present

## 2024-05-20 DIAGNOSIS — N186 End stage renal disease: Secondary | ICD-10-CM

## 2024-05-20 HISTORY — PX: DIALYSIS/PERMA CATHETER INSERTION: CATH118288

## 2024-05-20 LAB — RENAL FUNCTION PANEL
Albumin: 3.5 g/dL (ref 3.5–5.0)
Anion gap: 11 (ref 5–15)
BUN: 74 mg/dL — ABNORMAL HIGH (ref 8–23)
CO2: 24 mmol/L (ref 22–32)
Calcium: 8.6 mg/dL — ABNORMAL LOW (ref 8.9–10.3)
Chloride: 101 mmol/L (ref 98–111)
Creatinine, Ser: 5.19 mg/dL — ABNORMAL HIGH (ref 0.61–1.24)
GFR, Estimated: 10 mL/min — ABNORMAL LOW (ref >60)
Glucose, Bld: 105 mg/dL — ABNORMAL HIGH (ref 70–99)
Phosphorus: 5.1 mg/dL — ABNORMAL HIGH (ref 2.5–4.6)
Potassium: 5.4 mmol/L — ABNORMAL HIGH (ref 3.5–5.1)
Sodium: 137 mmol/L (ref 135–145)

## 2024-05-20 LAB — BASIC METABOLIC PANEL WITH GFR
Anion gap: 11 (ref 5–15)
BUN: 76 mg/dL — ABNORMAL HIGH (ref 8–23)
CO2: 24 mmol/L (ref 22–32)
Calcium: 8.7 mg/dL — ABNORMAL LOW (ref 8.9–10.3)
Chloride: 104 mmol/L (ref 98–111)
Creatinine, Ser: 5.23 mg/dL — ABNORMAL HIGH (ref 0.61–1.24)
GFR, Estimated: 10 mL/min — ABNORMAL LOW (ref >60)
Glucose, Bld: 91 mg/dL (ref 70–99)
Potassium: 6.4 mmol/L (ref 3.5–5.1)
Sodium: 139 mmol/L (ref 135–145)

## 2024-05-20 LAB — HEPATITIS B SURFACE ANTIGEN
Hepatitis B Surface Ag: NONREACTIVE
Hepatitis B Surface Ag: NONREACTIVE

## 2024-05-20 LAB — CBC
HCT: 27.1 % — ABNORMAL LOW (ref 39.0–52.0)
Hemoglobin: 8.8 g/dL — ABNORMAL LOW (ref 13.0–17.0)
MCH: 29.8 pg (ref 26.0–34.0)
MCHC: 32.5 g/dL (ref 30.0–36.0)
MCV: 91.9 fL (ref 80.0–100.0)
Platelets: 239 K/uL (ref 150–400)
RBC: 2.95 MIL/uL — ABNORMAL LOW (ref 4.22–5.81)
RDW: 17.1 % — ABNORMAL HIGH (ref 11.5–15.5)
WBC: 11.5 K/uL — ABNORMAL HIGH (ref 4.0–10.5)
nRBC: 1.9 % — ABNORMAL HIGH (ref 0.0–0.2)

## 2024-05-20 LAB — HEPATITIS B CORE ANTIBODY, IGM: Hep B C IgM: NONREACTIVE

## 2024-05-20 LAB — GLUCOSE, CAPILLARY
Glucose-Capillary: 110 mg/dL — ABNORMAL HIGH (ref 70–99)
Glucose-Capillary: 98 mg/dL (ref 70–99)
Glucose-Capillary: 122 mg/dL — ABNORMAL HIGH (ref 70–99)

## 2024-05-20 SURGERY — DIALYSIS/PERMA CATHETER INSERTION
Anesthesia: Moderate Sedation | Laterality: Right

## 2024-05-20 MED ORDER — ALBUTEROL SULFATE (2.5 MG/3ML) 0.083% IN NEBU
2.5000 mg | INHALATION_SOLUTION | Freq: Once | RESPIRATORY_TRACT | Status: AC
  Administered 2024-05-20: 08:00:00 2.5 mg via RESPIRATORY_TRACT
  Filled 2024-05-20: qty 3

## 2024-05-20 MED ORDER — LIDOCAINE HCL (PF) 1 % IJ SOLN
5.0000 mL | INTRAMUSCULAR | Status: DC | PRN
  Filled 2024-05-20: qty 5

## 2024-05-20 MED ORDER — SODIUM BICARBONATE 8.4 % IV SOLN: 50.0000 meq | Freq: Once | INTRAVENOUS | Status: DC

## 2024-05-20 MED ORDER — HEPARIN (PORCINE) IN NACL 1000-0.9 UT/500ML-% IV SOLN
INTRAVENOUS | Status: DC | PRN
  Administered 2024-05-20: 09:00:00 500 mL

## 2024-05-20 MED ORDER — MIDAZOLAM HCL 2 MG/2ML IJ SOLN
INTRAMUSCULAR | Status: AC
  Filled 2024-05-20: qty 2

## 2024-05-20 MED ORDER — CALCIUM GLUCONATE-NACL 1-0.675 GM/50ML-% IV SOLN
1.0000 g | Freq: Once | INTRAVENOUS | Status: AC
  Administered 2024-05-20: 09:00:00 1000 mg via INTRAVENOUS
  Filled 2024-05-20: qty 50

## 2024-05-20 MED ORDER — HEPARIN SODIUM (PORCINE) 10000 UNIT/ML IJ SOLN
INTRAMUSCULAR | Status: DC | PRN
  Administered 2024-05-20: 09:00:00 10000 [IU]

## 2024-05-20 MED ORDER — ALTEPLASE 2 MG IJ SOLR
2.0000 mg | Freq: Once | INTRAMUSCULAR | Status: DC | PRN
  Filled 2024-05-20: qty 2

## 2024-05-20 MED ORDER — DEXTROSE 50 % IV SOLN
1.0000 | Freq: Once | INTRAVENOUS | Status: AC
  Administered 2024-05-20: 08:00:00 50 mL via INTRAVENOUS
  Filled 2024-05-20: qty 50

## 2024-05-20 MED ORDER — CEFAZOLIN SODIUM-DEXTROSE 1-4 GM/50ML-% IV SOLN
1.0000 g | Freq: Once | INTRAVENOUS | Status: AC
  Administered 2024-05-20: 10:00:00 1 g via INTRAVENOUS

## 2024-05-20 MED ORDER — LIDOCAINE-PRILOCAINE 2.5-2.5 % EX CREA
1.0000 | TOPICAL_CREAM | CUTANEOUS | Status: DC | PRN
  Filled 2024-05-20: qty 5

## 2024-05-20 MED ORDER — SODIUM ZIRCONIUM CYCLOSILICATE 10 G PO PACK
10.0000 g | PACK | Freq: Once | ORAL | Status: AC
  Administered 2024-05-20: 08:00:00 10 g via ORAL
  Filled 2024-05-20: qty 1

## 2024-05-20 MED ORDER — LIDOCAINE-EPINEPHRINE (PF) 1 %-1:200000 IJ SOLN
INTRAMUSCULAR | Status: DC | PRN
  Administered 2024-05-20: 09:00:00 20 mL

## 2024-05-20 MED ORDER — HEPARIN SODIUM (PORCINE) 1000 UNIT/ML IJ SOLN
INTRAMUSCULAR | Status: AC
  Filled 2024-05-20: qty 4

## 2024-05-20 MED ORDER — CEFAZOLIN SODIUM-DEXTROSE 1-4 GM/50ML-% IV SOLN
INTRAVENOUS | Status: AC
  Filled 2024-05-20: qty 50

## 2024-05-20 MED ORDER — FENTANYL CITRATE (PF) 100 MCG/2ML IJ SOLN
INTRAMUSCULAR | Status: AC
  Filled 2024-05-20: qty 2

## 2024-05-20 MED ORDER — FENTANYL CITRATE (PF) 100 MCG/2ML IJ SOLN
INTRAMUSCULAR | Status: DC | PRN
  Administered 2024-05-20: 10:00:00 25 ug via INTRAVENOUS

## 2024-05-20 MED ORDER — HEPARIN SODIUM (PORCINE) 1000 UNIT/ML DIALYSIS
1000.0000 [IU] | INTRAMUSCULAR | Status: DC | PRN
  Filled 2024-05-20: qty 1

## 2024-05-20 MED ORDER — INSULIN ASPART 100 UNIT/ML IV SOLN
10.0000 [IU] | Freq: Once | INTRAVENOUS | Status: AC
  Administered 2024-05-20: 08:00:00 10 [IU] via INTRAVENOUS
  Filled 2024-05-20: qty 10

## 2024-05-20 MED ORDER — ONDANSETRON HCL 4 MG/2ML IJ SOLN
INTRAMUSCULAR | Status: AC
  Filled 2024-05-20: qty 2

## 2024-05-20 MED ORDER — MIDAZOLAM HCL (PF) 2 MG/2ML IJ SOLN
INTRAMUSCULAR | Status: DC | PRN
  Administered 2024-05-20: 10:00:00 1 mg via INTRAVENOUS

## 2024-05-20 MED ORDER — SODIUM BICARBONATE 8.4 % IV SOLN
50.0000 meq | Freq: Once | INTRAVENOUS | Status: AC
  Administered 2024-05-20: 09:00:00 50 meq via INTRAVENOUS
  Filled 2024-05-20: qty 50

## 2024-05-20 MED ORDER — PENTAFLUOROPROP-TETRAFLUOROETH EX AERO: 1.0000 | INHALATION_SPRAY | CUTANEOUS | Status: DC | PRN

## 2024-05-20 SURGICAL SUPPLY — 7 items
BIOPATCH RED 1 DISK 7.0 (GAUZE/BANDAGES/DRESSINGS) IMPLANT
CATH HEMO 15FR 19 SMART SEAL (HEMODIALYSIS SUPPLIES) IMPLANT
DERMABOND ADVANCED .7 DNX12 (GAUZE/BANDAGES/DRESSINGS) IMPLANT
KIT MICROPUNCTURE VSI 5F STIFF (SHEATH) IMPLANT
PACK ANGIOGRAPHY (CUSTOM PROCEDURE TRAY) ×1 IMPLANT
SUT MNCRL AB 4-0 PS2 18 (SUTURE) IMPLANT
SUT PROLENE 0 CT 1 30 (SUTURE) IMPLANT

## 2024-05-20 NOTE — Interval H&P Note (Signed)
 History and Physical Interval Note:  05/20/2024 9:12 AM  [name]  has presented today for surgery, with the diagnosis of ESRD.  The various methods of treatment have been discussed with the patient and family. After consideration of risks, benefits and other options for treatment, the patient has consented to  Procedures: DIALYSIS/PERMA CATHETER INSERTION (Right) as a surgical intervention.  The patient's history has been reviewed, patient examined, no change in status, stable for surgery.  I have reviewed the patient's chart and labs.  Questions were answered to the patient's satisfaction.     Braya Habermehl

## 2024-05-20 NOTE — Progress Notes (Signed)
 PT Cancellation Note  Patient Details Name: [name] MRN: [id number] DOB: 1940/10/12   Cancelled Treatment:    Reason Eval/Treat Not Completed: Patient at procedure or test/unavailable Pt OTF for dialysis at this time. Will f/u as able.  Richerd Pinal, PT, DPT 05/20/2024, 2:38 PM   [name] 05/20/2024, 2:38 PM

## 2024-05-20 NOTE — Progress Notes (Signed)
 PROGRESS NOTE    [name]  FMW:[id number] DOB: 03/01/1941 DOA: 05/17/2024 PCP: Vicci Duwaine SQUIBB, DO  Chief Complaint  Patient presents with   [facility] Course:  [name] is an 83 year old male with stage IV CKD, prior CVA, myelofibrosis, hypertension, hyperlipidemia, CAD, prior STEMI, chronic diastolic CHF, hypothyroidism, melanoma, mild cognitive impairment, BPH, chronic left eye blurry vision, subdural hematoma, A-fib not on anticoagulation, history of retinal detachment, history of pericarditis, chronic bilateral knee pain.  Knee presented to the hospital with generalized weakness and a fall after suffering 2 falls at home. Head CT on arrival did not reveal any acute intracranial abnormality.  It did reveal chronically displaced fracture of the right posterior arch of C1. He was found to have worsening hyperkalemia and worsening kidney dysfunction.  Creatinine 5.7, potassium 6.1.  Nephrology was consulted.  Patient is consenting to dialysis.  He underwent permacath placement on 12/15 with vascular surgery.  Subjective: Patient was seen after permacath placement today.  He reports he is drowsy but has no acute complaints otherwise  Objective: Vitals:   05/20/24 1045 05/20/24 1100 05/20/24 1101 05/20/24 1150  BP: 133/73 138/77  (!) 151/81  Pulse: 67 67 67 61  Resp: 17 10 14 17   Temp:    98.2 F (36.8 C)  TempSrc:    Oral  SpO2: 92% 94% (!) 89% 97%  Weight:      Height:        Intake/Output Summary (Last 24 hours) at 05/20/2024 1400 Last data filed at 05/20/2024 0015 Gross per 24 hour  Intake 360 ml  Output 900 ml  Net -540 ml   Filed Weights   05/18/24 0500 05/19/24 0500 05/20/24 0500  Weight: 67.4 kg 69.1 kg 71 kg    Examination: General exam: Appears calm and comfortable, NAD Respiratory system: No work of breathing, symmetric chest wall expansion Cardiovascular system: S1 & S2 heard, RRR.  Gastrointestinal system: Abdomen is nondistended, soft and  nontender.   Assessment & Plan:  Principal Problem:   Hyperkalemia Active Problems:   CKD (chronic kidney disease) stage 4, GFR 15-29 ml/min (HCC)   Fall at home, initial encounter   Leukocytosis   History of CVA (cerebrovascular accident)   Blurry vision, left eye   Myelofibrosis (HCC)   HTN (hypertension)   HLD (hyperlipidemia)   Hypothyroidism   Chronic diastolic CHF (congestive heart failure) (HCC)   BPH (benign prostatic hyperplasia)   Paroxysmal atrial fibrillation (HCC)   Pressure injury of skin    Hyperkalemia - Shifters as needed - Initiation of hemodialysis today - Continue trending CMP  CKD stage IV - New ESRD - Initiating HD now - Status post permacath placement today - Will need outpatient HD arranged  Anemia of chronic disease Myelofibrosis - Hemoglobin currently stable. - Monitor CBC closely - To be started on Micera per nephro  Paroxysmal atrial fibrillation - Currently rate controlled - Continue metoprolol  and amiodarone  - Not on AC at home due to history of SDH  Generalized weakness Recurrent falls - PT/OT, currently recommending SNF but pt is refusing.  Prefers home health.  Home health ordered.  Chronic diastolic CHF - Echo February 2025: EF 50 to 55%, grade 1 diastolic dysfunction - Euvolemic at this time - Lasix  as needed - Additional volume management per HD  Hyperlipidemia - Continue statin - LDL 62  Leukocytosis - Likely reactive, no evidence of infection  Chronic C1 arch fracture - PT/OT as above  Hypertension -  Titrate meds as needed - Increase amlodipine  today  History of CVA History of SDH Chronic blurry vision in left eye - Resume all home meds  BPH - Resume home meds  DVT prophylaxis: SCDs   Code Status: Full Code Disposition:  Needs HD and outpt HD arrangements   Consultants:    Procedures:    Antimicrobials:  Anti-infectives (From admission, onward)    Start     Dose/Rate Route Frequency Ordered  Stop   05/20/24 0930  ceFAZolin  (ANCEF ) IVPB 1 g/50 mL premix        1 g 100 mL/hr over 30 Minutes Intravenous  Once 05/20/24 0919 05/20/24 1015       Data Reviewed: I have personally reviewed following labs and imaging studies CBC: Recent Labs  Lab 05/17/24 1538 05/18/24 0517 05/19/24 0522  WBC 14.7* 14.0* 11.3*  NEUTROABS 8.5*  --   --   HGB 9.7* 8.7* 9.0*  HCT 30.7* 26.4* 28.7*  MCV 94.2 91.7 96.6  PLT 321 299 272   Basic Metabolic Panel: Recent Labs  Lab 05/17/24 1538 05/17/24 1809 05/17/24 2213 05/18/24 0517 05/19/24 0522 05/19/24 1525 05/20/24 0512  NA 138  --   --  139 140  --  139  K 6.1*  --  5.5* 5.3* 6.5* 5.7* 6.4*  CL 104  --   --  105 105  --  104  CO2 20*  --   --  21* 22  --  24  GLUCOSE 96  --   --  92 83  --  91  BUN 81*  --   --  75* 73*  --  76*  CREATININE 5.77*  --   --  5.20* 5.03*  --  5.23*  CALCIUM  9.0  --   --  8.7* 8.8*  --  8.7*  MG  --  2.1  --   --   --   --   --   PHOS 5.3*  --   --   --  5.6*  --   --    GFR: Estimated Creatinine Clearance: 10.7 mL/min (A) (by C-G formula based on SCr of 5.23 mg/dL (H)). Liver Function Tests: Recent Labs  Lab 05/17/24 1538 05/19/24 0522  AST 25  --   ALT 21  --   ALKPHOS 142*  --   BILITOT 0.5  --   PROT 6.7  --   ALBUMIN  4.1 3.5   CBG: Recent Labs  Lab 05/17/24 1845 05/19/24 1112 05/20/24 0917 05/20/24 1101  GLUCAP 78 119* 110* 98    Recent Results (from the past 240 hours)  MRSA Next Gen by PCR, Nasal     Status: None   Collection Time: 05/18/24 12:25 AM   Specimen: Nasal Mucosa; Nasal Swab  Result Value Ref Range Status   MRSA by PCR Next Gen NOT DETECTED NOT DETECTED Final    Comment: (NOTE) The GeneXpert MRSA Assay (FDA approved for NASAL specimens only), is one component of a comprehensive MRSA colonization surveillance program. It is not intended to diagnose MRSA infection nor to guide or monitor treatment for MRSA infections. Test performance is not FDA approved in  patients less than 65 years old. Performed at [facility], [address], [place], [place] [address]      Radiology Studies: PERIPHERAL VASCULAR CATHETERIZATION Result Date: 05/20/2024 See surgical note for result.   Scheduled Meds:  amiodarone   200 mg Oral Daily   amLODipine   10 mg Oral Daily  Chlorhexidine  Gluconate Cloth  6 each Topical Q0600   cyanocobalamin   1,000 mcg Oral Daily   DULoxetine   20 mg Oral QHS   feeding supplement  237 mL Oral BID BM   fentaNYL        finasteride   5 mg Oral Daily   levothyroxine   150 mcg Oral QAC breakfast   metoprolol  succinate  25 mg Oral BID   midazolam        multivitamin with minerals  1 tablet Oral q AM   pantoprazole   40 mg Oral Daily   simvastatin   20 mg Oral Daily   sodium bicarbonate   650 mg Oral TID   sodium zirconium cyclosilicate   10 g Oral BID   tamsulosin   0.4 mg Oral Daily   Continuous Infusions:  promethazine  (PHENERGAN ) injection (IM or IVPB) Stopped (05/18/24 1845)     LOS: 2 days  MDM: Patient is high risk for one or more organ failure.  They necessitate ongoing hospitalization for continued IV therapies and subsequent lab monitoring. Total time spent interpreting labs and vitals, reviewing the medical record, coordinating care amongst consultants and care team members, directly assessing and discussing care with the patient and/or family: 55 min  Jakiera Ehler, DO [facility]  To contact the attending physician between 7A-7P please use Epic Chat. To contact the covering physician during after hours 7P-7A, please review Amion.  05/20/2024, 2:00 PM   *This document has been created with the assistance of dictation software. Please excuse typographical errors. *

## 2024-05-20 NOTE — Progress Notes (Signed)
 Patient completed 1.5 hours of his ordered treatment. Patient was adamant regarding returning to his room and stopping his treatment. Patient was provided education regarding his elevated potassium level but still insisted on returning to his room. Vital signs were assessed. Blood pressure was 122/64, HR 62 , O2 level 98 on ra and Blood glucose 122.

## 2024-05-20 NOTE — Telephone Encounter (Signed)
 Pt has appt 06/13/24 Maeola Lunch, NP for preop clearance.

## 2024-05-20 NOTE — Op Note (Signed)
 OPERATIVE NOTE    PRE-OPERATIVE DIAGNOSIS: 1. ESRD   POST-OPERATIVE DIAGNOSIS: same as above  PROCEDURE: Ultrasound guidance for vascular access to the right internal jugular vein Fluoroscopic guidance for placement of catheter Placement of a 19 cm tip to cuff tunneled hemodialysis catheter via the right internal jugular vein  SURGEON: Selinda Gu, MD  ANESTHESIA:  Local with Moderate conscious sedation for approximately 29 minutes using 1 mg of Versed  and 25 mcg of Fentanyl   ESTIMATED BLOOD LOSS: 5 cc  FLUORO TIME: less than one minute  CONTRAST: none  FINDING(S): 1.  Patent right internal jugular vein  SPECIMEN(S):  None  INDICATIONS:   [name] is a 83 y.o.male who presents with renal failure.  The patient needs long term dialysis access for their ESRD, and a Permcath is necessary.  Risks and benefits are discussed and informed consent is obtained.    DESCRIPTION: After obtaining full informed written consent, the patient was brought back to the vascular suited. The patient's right neck and chest were sterilely prepped and draped in a sterile surgical field was created. Moderate conscious sedation was administered during a face to face encounter with the patient throughout the procedure with my supervision of the RN administering medicines and monitoring the patient's vital signs, pulse oximetry, telemetry and mental status throughout from the start of the procedure until the patient was taken to the recovery room.  The right internal jugular vein was visualized with ultrasound and found to be patent. It was then accessed under direct ultrasound guidance and a permanent image was recorded. A wire was placed. After skin nick and dilatation, the peel-away sheath was placed over the wire. I then turned my attention to an area under the clavicle. Approximately 1-2 fingerbreadths below the clavicle a small counterincision was created and tunneled from the subclavicular incision to  the access site. Using fluoroscopic guidance, a 19 centimeter tip to cuff tunneled hemodialysis catheter was selected, and tunneled from the subclavicular incision to the access site. It was then placed through the peel-away sheath and the peel-away sheath was removed. Using fluoroscopic guidance the catheter tips were parked in the right atrium. The appropriate distal connectors were placed. It withdrew blood well and flushed easily with heparinized saline and a concentrated heparin  solution was then placed. It was secured to the chest wall with 2 Prolene sutures. The access incision was closed single 4-0 Monocryl. A 4-0 Monocryl pursestring suture was placed around the exit site. Sterile dressings were placed. The patient tolerated the procedure well and was taken to the recovery room in stable condition.  COMPLICATIONS: None  CONDITION: Stable  Selinda Gu, MD 05/20/2024 10:09 AM   This note was created with Dragon Medical transcription system. Any errors in dictation are purely unintentional.

## 2024-05-20 NOTE — Telephone Encounter (Signed)
° °  Name: [name]  DOB: Jun 19, 1940  MRN: [id number]  Primary Cardiologist: Evalene Lunger, MD  Chart reviewed as part of pre-operative protocol coverage. Because of [name]'s past medical history and time since last visit, he will require a follow-up in-office visit in order to better assess preoperative cardiovascular risk. Multiple medical problems to include CVA, anemia, atrial fib not on Eliquis  due to [facility]. Hospitalized in April 2025 for [facility] Will need in person evaluation for left knee arthroplasty on TBD.   Pre-op covering staff: - Please schedule appointment and call patient to inform them. If patient already had an upcoming appointment within acceptable timeframe, please add pre-op clearance to the appointment notes so provider is aware. - Please contact requesting surgeon's office via preferred method (i.e, phone, fax) to inform them of need for appointment prior to surgery.    Lamarr Satterfield, NP  05/20/2024, 3:50 PM

## 2024-05-20 NOTE — Discharge Instructions (Signed)
 [facility]  They will call you to schedule when they will come out to see you for nursing and Physical Therapy Home health care service [id number]  [phone] Open ? Closes 5?PM

## 2024-05-20 NOTE — Telephone Encounter (Signed)
° °  Pre-operative Risk Assessment    Patient Name: [name]  DOB: 1941/04/24 MRN: [id number]   Date of last office visit: 07/04/2023 Date of next office visit: n/a   Request for Surgical Clearance    Procedure:  Left knee arthroplasty  Date of Surgery:  Clearance TBD                                Surgeon:  Dr. Mardee Surgeon's Group or Practice Name:  [facility] Phone number:  [phone] Fax number:  [phone]   Type of Clearance Requested:   - Pharmacy:  Hold not indicated      Type of Anesthesia:  Not Indicated   Additional requests/questions:    SignedTinnie NOVAK Schools   05/20/2024, 3:13 PM

## 2024-05-20 NOTE — Progress Notes (Signed)
 OT Cancellation Note  Patient Details Name: [name] MRN: [id number] DOB: [birth year]   Cancelled Treatment:    Reason Eval/Treat Not Completed: Patient at procedure or test/ unavailable. OT continues to follow pt for therapy services. At this time, pt currently OTF for procedure. Will hold and re-attempt at a later date/time as available and pt medically appropriate for OT services.   [name], M.S., OTR/L 05/20/2024, 9:52 AM

## 2024-05-20 NOTE — Telephone Encounter (Signed)
 Patient is scheduled for 06/13/24 with CANDIE Lunch, NP.

## 2024-05-20 NOTE — Plan of Care (Signed)
   Problem: Pain Managment: Goal: General experience of comfort will improve and/or be controlled Outcome: Progressing   Problem: Skin Integrity: Goal: Risk for impaired skin integrity will decrease Outcome: Progressing

## 2024-05-20 NOTE — Progress Notes (Signed)
 [facility]  ROUNDING NOTE   Subjective:   Patient with past medical history of chronic kidney disease stage V, CVA, myelofibrosis, hypertension, upper lipidemia, coronary disease, history of myocardial infarction, diastolic heart failure, hypothyroidism, melanoma, mild cognitive impairment, BPH, atrial fibrillation, nephrolithiasis, history of pericarditis who presented with weakness and fall.  Patient well-known to my partner from the office.  Dr. Dennise has been following him closely.  Most recent outpatient eGFR was 13.  Renal function worse now with eGFR of 9.  Patient and Dr. Dennise have had conversations regarding renal placement therapy.  It appears that the patient is open to starting renal placement therapy at this time.  Update: Patient due for PermCath placement today. Resting comfortably in bed at the moment.  First treatment to be scheduled after.  Objective:  Vital signs in last 24 hours:  Temp:  [97.6 F (36.4 C)-98.5 F (36.9 C)] 98.3 F (36.8 C) (12/15 0909) Pulse Rate:  [61-69] 69 (12/15 0909) Resp:  [12-16] 12 (12/15 0909) BP: (131-163)/(79-89) 131/86 (12/15 0909) SpO2:  [95 %-97 %] 96 % (12/15 0909) Weight:  [71 kg] 71 kg (12/15 0500)  Weight change: 1.9 kg Filed Weights   05/18/24 0500 05/19/24 0500 05/20/24 0500  Weight: 67.4 kg 69.1 kg 71 kg    Intake/Output: I/O last 3 completed shifts: In: 580 [P.O.:580] Out: 1500 [Urine:1500]   Intake/Output this shift:  No intake/output data recorded.  Physical Exam: General: No acute distress  Head: Normocephalic, atraumatic. Moist oral mucosal membranes  Neck: Supple  Lungs:  Clear to auscultation, normal effort  Heart: S1S2 no rubs  Abdomen:  Soft, nontender, bowel sounds present  Extremities: No peripheral edema.  Neurologic: Awake, alert, following commands  Skin: No acute rash  Access: No hemodialysis access    Basic Metabolic Panel: Recent Labs  Lab 05/17/24 1538 05/17/24 1809  05/17/24 2213 05/18/24 0517 05/19/24 0522 05/19/24 1525 05/20/24 0512  NA 138  --   --  139 140  --  139  K 6.1*  --  5.5* 5.3* 6.5* 5.7* 6.4*  CL 104  --   --  105 105  --  104  CO2 20*  --   --  21* 22  --  24  GLUCOSE 96  --   --  92 83  --  91  BUN 81*  --   --  75* 73*  --  76*  CREATININE 5.77*  --   --  5.20* 5.03*  --  5.23*  CALCIUM  9.0  --   --  8.7* 8.8*  --  8.7*  MG  --  2.1  --   --   --   --   --   PHOS 5.3*  --   --   --  5.6*  --   --     Liver Function Tests: Recent Labs  Lab 05/17/24 1538 05/19/24 0522  AST 25  --   ALT 21  --   ALKPHOS 142*  --   BILITOT 0.5  --   PROT 6.7  --   ALBUMIN  4.1 3.5   No results for input(s): LIPASE, AMYLASE in the last 168 hours. No results for input(s): AMMONIA in the last 168 hours.  CBC: Recent Labs  Lab 05/17/24 1538 05/18/24 0517 05/19/24 0522  WBC 14.7* 14.0* 11.3*  NEUTROABS 8.5*  --   --   HGB 9.7* 8.7* 9.0*  HCT 30.7* 26.4* 28.7*  MCV 94.2 91.7 96.6  PLT  321 299 272    Cardiac Enzymes: No results for input(s): CKTOTAL, CKMB, CKMBINDEX, TROPONINI in the last 168 hours.  BNP: Invalid input(s): POCBNP  CBG: Recent Labs  Lab 05/17/24 1845 05/19/24 1112 05/20/24 0917  GLUCAP 78 119* 110*    Microbiology: Results for orders placed or performed during the hospital encounter of 05/17/24  MRSA Next Gen by PCR, Nasal     Status: None   Collection Time: 05/18/24 12:25 AM   Specimen: Nasal Mucosa; Nasal Swab  Result Value Ref Range Status   MRSA by PCR Next Gen NOT DETECTED NOT DETECTED Final    Comment: (NOTE) The GeneXpert MRSA Assay (FDA approved for NASAL specimens only), is one component of a comprehensive MRSA colonization surveillance program. It is not intended to diagnose MRSA infection nor to guide or monitor treatment for MRSA infections. Test performance is not FDA approved in patients less than 73 years old. Performed at [facility], [address]  [place], [place] [address]    *Note: Due to a large number of results and/or encounters for the requested time period, some results have not been displayed. A complete set of results can be found in Results Review.    Coagulation Studies: No results for input(s): LABPROT, INR in the last 72 hours.  Urinalysis: Recent Labs    05/17/24 1811  COLORURINE YELLOW*  LABSPEC 1.016  PHURINE 6.0  GLUCOSEU NEGATIVE  HGBUR NEGATIVE  BILIRUBINUR NEGATIVE  KETONESUR NEGATIVE  PROTEINUR >=300*  NITRITE NEGATIVE  LEUKOCYTESUR NEGATIVE      Imaging: No results found.    Medications:    calcium  gluconate 1,000 mg (05/20/24 0841)    ceFAZolin  (ANCEF ) IV     [MAR Hold] promethazine  (PHENERGAN ) injection (IM or IVPB) Stopped (05/18/24 1845)    [MAR Hold] amiodarone   200 mg Oral Daily   [MAR Hold] amLODipine   10 mg Oral Daily   [MAR Hold] Chlorhexidine  Gluconate Cloth  6 each Topical Q0600   [MAR Hold] cyanocobalamin   1,000 mcg Oral Daily   [MAR Hold] DULoxetine   20 mg Oral QHS   [MAR Hold] feeding supplement  237 mL Oral BID BM   fentaNYL        [MAR Hold] finasteride   5 mg Oral Daily   [MAR Hold] levothyroxine   150 mcg Oral QAC breakfast   [MAR Hold] metoprolol  succinate  25 mg Oral BID   midazolam        [MAR Hold] multivitamin with minerals  1 tablet Oral q AM   [MAR Hold] pantoprazole   40 mg Oral Daily   [MAR Hold] simvastatin   20 mg Oral Daily   [MAR Hold] sodium bicarbonate   650 mg Oral TID   [MAR Hold] sodium zirconium cyclosilicate   10 g Oral BID   [MAR Hold] tamsulosin   0.4 mg Oral Daily   [MAR Hold] acetaminophen , fentaNYL , Heparin  (Porcine) in NaCl, heparin , [MAR Hold] hydrALAZINE , lidocaine -EPINEPHrine  (PF), midazolam , [MAR Hold]  morphine  injection, [MAR Hold] ondansetron  (ZOFRAN ) IV, [MAR Hold] promethazine  (PHENERGAN ) injection (IM or IVPB), [MAR Hold] traMADol , [MAR Hold] traZODone   Assessment/ Plan:  83 y.o. male chronic kidney disease stage V, CVA, myelofibrosis,  hypertension, upper lipidemia, coronary disease, history of myocardial infarction, diastolic heart failure, hypothyroidism, melanoma, mild cognitive impairment, BPH, atrial fibrillation, nephrolithiasis, history of pericarditis who presented with weakness and fall.  Patient found to have worsening underlying chronic kidney disease.  1.  Chronic kidney disease stage V.  Patient has ongoing severe renal dysfunction.  Most recent outpatient eGFR was 13  on 03/14/2024.  eGFR now down to 9.  Patient having weakness and fatigue.   Update: Patient due for PermCath placement today.  Thereafter we will plan for first dialysis treatment.  2.  Hyperkalemia.  Potassium was down to 5.7 but then back up to 6.4.  Patient undergoing dialysis catheter placement today.  3.  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  Hemoglobin currently 9.0.  Continue to monitor CBC.  4.  Secondary hyperparathyroidism.  Continue to monitor serum phosphorus closely.   LOS: 2 Alekzander Cardell 12/15/20259:35 AM

## 2024-05-21 LAB — HEPATITIS B SURFACE ANTIBODY, QUANTITATIVE
Hep B S AB Quant (Post): <3.5 m[IU]/mL — ABNORMAL LOW
Hep B S AB Quant (Post): <3.5 m[IU]/mL — ABNORMAL LOW

## 2024-05-21 LAB — BASIC METABOLIC PANEL WITH GFR
Anion gap: 11 (ref 5–15)
BUN: 60 mg/dL — ABNORMAL HIGH (ref 8–23)
CO2: 26 mmol/L (ref 22–32)
Calcium: 8.7 mg/dL — ABNORMAL LOW (ref 8.9–10.3)
Chloride: 101 mmol/L (ref 98–111)
Creatinine, Ser: 4.65 mg/dL — ABNORMAL HIGH (ref 0.61–1.24)
GFR, Estimated: 12 mL/min — ABNORMAL LOW (ref >60)
Glucose, Bld: 124 mg/dL — ABNORMAL HIGH (ref 70–99)
Potassium: 5 mmol/L (ref 3.5–5.1)
Sodium: 138 mmol/L (ref 135–145)

## 2024-05-21 MED ORDER — LACTULOSE 10 GM/15ML PO SOLN
20.0000 g | Freq: Two times a day (BID) | ORAL | Status: DC
  Administered 2024-05-21 – 2024-05-22 (×3): 20 g via ORAL
  Filled 2024-05-21 (×3): qty 30

## 2024-05-21 MED ORDER — HEPARIN SODIUM (PORCINE) 1000 UNIT/ML IJ SOLN
INTRAMUSCULAR | Status: AC
  Filled 2024-05-21: qty 5

## 2024-05-21 NOTE — Progress Notes (Signed)
 PT Cancellation Note  Patient Details Name: [name] MRN: [id number] DOB: Oct 12, 1940   Cancelled Treatment:     Pt not available in am due to HD, therapist attempted once pt returned to room, however he was unable to participate due to significant HA and nausea. Will re-attempt next available date/time per POC.    [name] 05/21/2024, 2:25 PM

## 2024-05-21 NOTE — Care Management Important Message (Signed)
 Important Message  Patient Details  Name: [name] MRN: [id number] Date of Birth: 26-Jun-1940   Important Message Given:  Yes - Medicare IM     Yilin Weedon W, CMA 05/21/2024, 11:58 AM

## 2024-05-21 NOTE — Progress Notes (Signed)
 OT Cancellation Note  Patient Details Name: [name] MRN: [id number] DOB: Apr 04, 1941   Cancelled Treatment:    Reason Eval/Treat Not Completed: Patient at procedure or test/ unavailable. Pt at dialysis, will re-attempt OT tx at later date/time as pt is available and appropriate.   Tavari Loadholt R., MPH, MS, OTR/L ascom [phone] 05/21/2024, 8:31 AM

## 2024-05-21 NOTE — Progress Notes (Signed)
°  Received patient in bed to unit.   Informed consent signed and in chart.    TX duration: 2.5hrs     Transported back to floor  Hand-off given to patient's nurse. No acute distress noted    Access used: R HD Catheter  Access issues: none   Total UF removed: none Medication(s) given: none Post HD VS: wnl Post HD weight: 69.9kg     Olivia Hurst LPN [facility] D[facility]

## 2024-05-21 NOTE — Plan of Care (Signed)
  Problem: Clinical Measurements: Goal: Diagnostic test results will improve Outcome: Progressing   Problem: Pain Managment: Goal: General experience of comfort will improve and/or be controlled Outcome: Progressing   Problem: Skin Integrity: Goal: Risk for impaired skin integrity will decrease Outcome: Progressing

## 2024-05-21 NOTE — Progress Notes (Signed)
 Pt accepted at [facility] on MWF at 12pm. Pt can start 05/24/24 and will need to arrive at 11:30am for new pt paperwork.                      SABRA Suzen Satchel Dialysis Navigator [phone]

## 2024-05-21 NOTE — Progress Notes (Signed)
 [facility]  ROUNDING NOTE   Subjective:   Patient with past medical history of chronic kidney disease stage V, CVA, myelofibrosis, hypertension, upper lipidemia, coronary disease, history of myocardial infarction, diastolic heart failure, hypothyroidism, melanoma, mild cognitive impairment, BPH, atrial fibrillation, nephrolithiasis, history of pericarditis who presented with weakness and fall.  Patient well-known to my partner from the office.  Dr. Dennise has been following him closely.  Most recent outpatient eGFR was 13.  Renal function worse now with eGFR of 9.  Patient and Dr. Dennise have had conversations regarding renal placement therapy.  It appears that the patient is open to starting renal placement therapy at this time.  Update: Patient underwent second dialysis treatment successfully today.  Tolerated PermCath treatment well.  Patient to be admitted to [facility] on MWF schedule.  Objective:  Vital signs in last 24 hours:  Temp:  [97.5 F (36.4 C)-98.5 F (36.9 C)] 98.5 F (36.9 C) (12/16 1534) Pulse Rate:  [59-73] 71 (12/16 1534) Resp:  [0-22] 16 (12/16 1534) BP: (116-155)/(64-83) 155/78 (12/16 1534) SpO2:  [93 %-97 %] 93 % (12/16 1534) Weight:  [67.8 kg-70.4 kg] 69.9 kg (12/16 1044)  Weight change: 1.8 kg Filed Weights   05/21/24 0453 05/21/24 0756 05/21/24 1044  Weight: 67.8 kg 70.4 kg 69.9 kg    Intake/Output: I/O last 3 completed shifts: In: 120 [P.O.:120] Out: 700 [Urine:700]   Intake/Output this shift:  Total I/O In: 200 [P.O.:200] Out: 0   Physical Exam: General: No acute distress  Head: Normocephalic, atraumatic. Moist oral mucosal membranes  Neck: Supple  Lungs:  Clear to auscultation, normal effort  Heart: S1S2 no rubs  Abdomen:  Soft, nontender, bowel sounds present  Extremities: No peripheral edema.  Neurologic: Awake, alert, following commands  Skin: No acute rash  Access: Right IJ PermCath    Basic Metabolic  Panel: Recent Labs  Lab 05/17/24 1538 05/17/24 1809 05/17/24 2213 05/18/24 0517 05/19/24 0522 05/19/24 1525 05/20/24 0512 05/20/24 1356 05/21/24 0657  NA 138  --   --  139 140  --  139 137 138  K 6.1*  --    < > 5.3* 6.5* 5.7* 6.4* 5.4* 5.0  CL 104  --   --  105 105  --  104 101 101  CO2 20*  --   --  21* 22  --  24 24 26   GLUCOSE 96  --   --  92 83  --  91 105* 124*  BUN 81*  --   --  75* 73*  --  76* 74* 60*  CREATININE 5.77*  --   --  5.20* 5.03*  --  5.23* 5.19* 4.65*  CALCIUM  9.0  --   --  8.7* 8.8*  --  8.7* 8.6* 8.7*  MG  --  2.1  --   --   --   --   --   --   --   PHOS 5.3*  --   --   --  5.6*  --   --  5.1*  --    < > = values in this interval not displayed.    Liver Function Tests: Recent Labs  Lab 05/17/24 1538 05/19/24 0522 05/20/24 1356  AST 25  --   --   ALT 21  --   --   ALKPHOS 142*  --   --   BILITOT 0.5  --   --   PROT 6.7  --   --  ALBUMIN  4.1 3.5 3.5   No results for input(s): LIPASE, AMYLASE in the last 168 hours. No results for input(s): AMMONIA in the last 168 hours.  CBC: Recent Labs  Lab 05/17/24 1538 05/18/24 0517 05/19/24 0522 05/20/24 1356  WBC 14.7* 14.0* 11.3* 11.5*  NEUTROABS 8.5*  --   --   --   HGB 9.7* 8.7* 9.0* 8.8*  HCT 30.7* 26.4* 28.7* 27.1*  MCV 94.2 91.7 96.6 91.9  PLT 321 299 272 239    Cardiac Enzymes: No results for input(s): CKTOTAL, CKMB, CKMBINDEX, TROPONINI in the last 168 hours.  BNP: Invalid input(s): POCBNP  CBG: Recent Labs  Lab 05/17/24 1845 05/19/24 1112 05/20/24 0917 05/20/24 1101 05/20/24 1548  GLUCAP 78 119* 110* 98 122*    Microbiology: Results for orders placed or performed during the hospital encounter of 05/17/24  MRSA Next Gen by PCR, Nasal     Status: None   Collection Time: 05/18/24 12:25 AM   Specimen: Nasal Mucosa; Nasal Swab  Result Value Ref Range Status   MRSA by PCR Next Gen NOT DETECTED NOT DETECTED Final    Comment: (NOTE) The GeneXpert MRSA Assay  (FDA approved for NASAL specimens only), is one component of a comprehensive MRSA colonization surveillance program. It is not intended to diagnose MRSA infection nor to guide or monitor treatment for MRSA infections. Test performance is not FDA approved in patients less than 32 years old. Performed at [facility], [address] [place], [place] [address]    *Note: Due to a large number of results and/or encounters for the requested time period, some results have not been displayed. A complete set of results can be found in Results Review.    Coagulation Studies: No results for input(s): LABPROT, INR in the last 72 hours.  Urinalysis: No results for input(s): COLORURINE, LABSPEC, PHURINE, GLUCOSEU, HGBUR, BILIRUBINUR, KETONESUR, PROTEINUR, UROBILINOGEN, NITRITE, LEUKOCYTESUR in the last 72 hours.  Invalid input(s): APPERANCEUR     Imaging: DG Chest 1 View Result Date: 05/20/2024 EXAM: 1 VIEW(S) XRAY OF THE CHEST 05/20/2024 01:37:10 PM COMPARISON: 10/24/2023 CLINICAL HISTORY: ESRD (end stage renal disease) (HCC) FINDINGS: LINES, TUBES AND DEVICES: Right chest wall dialysis catheter in place with tip overlying the expected region of the superior cavoatrial junction. LUNGS AND PLEURA: Chronic Right base airspace opacity. Chronic Small right pleural effusion. No pneumothorax. HEART AND MEDIASTINUM: Cardiomegaly. Atherosclerotic plaque. BONES AND SOFT TISSUES: Right shoulder arthroplasty noted. IMPRESSION: 1. Right chest wall dialysis catheter in expected position with tip at the superior cavoatrial junction. 2. Chronic right basilar airspace opacity with small right pleural effusion. Electronically signed by: Oneil Devonshire MD 05/20/2024 07:38 PM EST RP Workstation: [id number]   PERIPHERAL VASCULAR CATHETERIZATION Result Date: 05/20/2024 See surgical note for result.     Medications:    promethazine  (PHENERGAN ) injection (IM or IVPB) Stopped  (05/18/24 1845)    amiodarone   200 mg Oral Daily   amLODipine   10 mg Oral Daily   Chlorhexidine  Gluconate Cloth  6 each Topical Q0600   cyanocobalamin   1,000 mcg Oral Daily   DULoxetine   20 mg Oral QHS   feeding supplement  237 mL Oral BID BM   finasteride   5 mg Oral Daily   lactulose   20 g Oral BID   levothyroxine   150 mcg Oral QAC breakfast   metoprolol  succinate  25 mg Oral BID   multivitamin with minerals  1 tablet Oral q AM   pantoprazole   40 mg Oral Daily   simvastatin   20 mg Oral Daily   sodium bicarbonate   650 mg Oral TID   sodium zirconium cyclosilicate   10 g Oral BID   tamsulosin   0.4 mg Oral Daily   acetaminophen , hydrALAZINE , morphine  injection, ondansetron  (ZOFRAN ) IV, promethazine  (PHENERGAN ) injection (IM or IVPB), traMADol , traZODone   Assessment/ Plan:  83 y.o. male chronic kidney disease stage V, CVA, myelofibrosis, hypertension, upper lipidemia, coronary disease, history of myocardial infarction, diastolic heart failure, hypothyroidism, melanoma, mild cognitive impairment, BPH, atrial fibrillation, nephrolithiasis, history of pericarditis who presented with weakness and fall.  Patient found to have worsening underlying chronic kidney disease.  1.  Chronic kidney disease stage V.  Patient has ongoing severe renal dysfunction.  Most recent outpatient eGFR was 13 on 03/14/2024.  eGFR now down to 9.  Patient having weakness and fatigue.  PermCath placed 05/20/2024. Update: Patient underwent first dialysis treatment yesterday and second dialysis treatment today.  Outpatient placement has been secured at [facility] on MWF schedule.  He will need third dialysis treatment before he can be discharged.  2.  Hyperkalemia.  Potassium down to 5.0.  Discontinue Lokelma .  3.  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  Hemoglobin currently 8.8.  Will start the patient on Mircera as outpatient.  4.  Secondary hyperparathyroidism.  Phosphorus 5.1 at  last check.  Continue to monitor bone metabolism parameters.   LOS: 3 Kely Dohn 12/16/20254:22 PM

## 2024-05-21 NOTE — Plan of Care (Signed)
  Problem: Clinical Measurements: Goal: Will remain free from infection Outcome: Progressing Goal: Respiratory complications will improve Outcome: Progressing   Problem: Activity: Goal: Risk for activity intolerance will decrease Outcome: Progressing   Problem: Coping: Goal: Level of anxiety will decrease Outcome: Progressing   Problem: Elimination: Goal: Will not experience complications related to urinary retention Outcome: Progressing   Problem: Pain Managment: Goal: General experience of comfort will improve and/or be controlled Outcome: Progressing   Problem: Safety: Goal: Ability to remain free from injury will improve Outcome: Progressing   Problem: Skin Integrity: Goal: Risk for impaired skin integrity will decrease Outcome: Progressing

## 2024-05-21 NOTE — Progress Notes (Signed)
 PROGRESS NOTE    [name]  FMW:[id number] DOB: 15-Feb-1941 DOA: 05/17/2024 PCP: Vicci Duwaine SQUIBB, DO  Chief Complaint  Patient presents with   [facility] Course:  [name] is an 83 year old male with stage IV CKD, prior CVA, myelofibrosis, hypertension, hyperlipidemia, CAD, prior STEMI, chronic diastolic CHF, hypothyroidism, melanoma, mild cognitive impairment, BPH, chronic left eye blurry vision, subdural hematoma, A-fib not on anticoagulation, history of retinal detachment, history of pericarditis, chronic bilateral knee pain.  Knee presented to the hospital with generalized weakness and a fall after suffering 2 falls at home. Head CT on arrival did not reveal any acute intracranial abnormality.  It did reveal chronically displaced fracture of the right posterior arch of C1. He was found to have worsening hyperkalemia and worsening kidney dysfunction.  Creatinine 5.7, potassium 6.1.  Nephrology was consulted.  Patient is consenting to dialysis.  He underwent permacath placement on 12/15 with vascular surgery.  Subjective: No acute events overnight.  At the time of evaluation patient is sleeping easily.  His wife is at bedside.  She has many questions regarding dialysis.  I have answered to the best of my ability  Objective: Vitals:   05/21/24 1030 05/21/24 1044 05/21/24 1100 05/21/24 1534  BP: 116/83 (!) 143/73 129/81 (!) 155/78  Pulse: 72 73 71 71  Resp: 15  16 16   Temp:  98.2 F (36.8 C) 98.2 F (36.8 C) 98.5 F (36.9 C)  TempSrc:  Oral    SpO2: 97% 97% 97% 93%  Weight:  69.9 kg    Height:        Intake/Output Summary (Last 24 hours) at 05/21/2024 1635 Last data filed at 05/21/2024 1300 Gross per 24 hour  Intake 200 ml  Output 300 ml  Net -100 ml   Filed Weights   05/21/24 0453 05/21/24 0756 05/21/24 1044  Weight: 67.8 kg 70.4 kg 69.9 kg    Examination: General exam: Appears calm and comfortable, NAD Respiratory system: No work of breathing,  symmetric chest wall expansion Cardiovascular system: S1 & S2 heard, RRR.  Gastrointestinal system: Abdomen is nondistended, soft and nontender.   Assessment & Plan:  Principal Problem:   Hyperkalemia Active Problems:   CKD (chronic kidney disease) stage 4, GFR 15-29 ml/min (HCC)   Fall at home, initial encounter   Leukocytosis   History of CVA (cerebrovascular accident)   Blurry vision, left eye   Myelofibrosis (HCC)   HTN (hypertension)   HLD (hyperlipidemia)   Hypothyroidism   Chronic diastolic CHF (congestive heart failure) (HCC)   BPH (benign prostatic hyperplasia)   Paroxysmal atrial fibrillation (HCC)   Pressure injury of skin    Hyperkalemia - Has been receiving shifters - Now well-controlled with dialysis - Continue to monitor  CKD stage IV - Newly declared ESRD - Has received permacath and tolerated dialysis x 2 - Will need dialysis 1 more time prior to discharge.  Outpatient hemodialysis has been arranged starting 12/19.  Will be Monday Wednesday Friday.   -Nephrology following  Anemia of chronic disease Myelofibrosis - Hemoglobin currently stable. - Monitor CBC closely - To be started on Micera per nephro - Previously was on hospice, follows outpatient with hematology  Paroxysmal atrial fibrillation - Currently rate controlled - Continue metoprolol  and amiodarone  - Not on AC at home due to history of SDH  Generalized weakness Recurrent falls - PT/OT, currently recommending SNF but pt is refusing.  Prefers home health.  Home health ordered.  Chronic diastolic  CHF - Echo February 2025: EF 50 to 55%, grade 1 diastolic dysfunction - Euvolemic at this time - Lasix  as needed - Additional volume management per HD  Hyperlipidemia - Continue statin - LDL 62  Leukocytosis - Likely reactive, no evidence of infection  Chronic C1 arch fracture - PT/OT as above  Hypertension - Continue to monitor.  Titrate meds as needed.  Expect further resolution  with dialysis  History of CVA History of SDH Chronic blurry vision in left eye - Resume all home meds  BPH - Resume home meds  DVT prophylaxis: SCDs   Code Status: Full Code Disposition: Needs additional session of HD prior to DC  Consultants:    Procedures:    Antimicrobials:  Anti-infectives (From admission, onward)    Start     Dose/Rate Route Frequency Ordered Stop   05/20/24 0930  ceFAZolin  (ANCEF ) IVPB 1 g/50 mL premix        1 g 100 mL/hr over 30 Minutes Intravenous  Once 05/20/24 0919 05/20/24 1015       Data Reviewed: I have personally reviewed following labs and imaging studies CBC: Recent Labs  Lab 05/17/24 1538 05/18/24 0517 05/19/24 0522 05/20/24 1356  WBC 14.7* 14.0* 11.3* 11.5*  NEUTROABS 8.5*  --   --   --   HGB 9.7* 8.7* 9.0* 8.8*  HCT 30.7* 26.4* 28.7* 27.1*  MCV 94.2 91.7 96.6 91.9  PLT 321 299 272 239   Basic Metabolic Panel: Recent Labs  Lab 05/17/24 1538 05/17/24 1809 05/17/24 2213 05/18/24 0517 05/19/24 0522 05/19/24 1525 05/20/24 0512 05/20/24 1356 05/21/24 0657  NA 138  --   --  139 140  --  139 137 138  K 6.1*  --    < > 5.3* 6.5* 5.7* 6.4* 5.4* 5.0  CL 104  --   --  105 105  --  104 101 101  CO2 20*  --   --  21* 22  --  24 24 26   GLUCOSE 96  --   --  92 83  --  91 105* 124*  BUN 81*  --   --  75* 73*  --  76* 74* 60*  CREATININE 5.77*  --   --  5.20* 5.03*  --  5.23* 5.19* 4.65*  CALCIUM  9.0  --   --  8.7* 8.8*  --  8.7* 8.6* 8.7*  MG  --  2.1  --   --   --   --   --   --   --   PHOS 5.3*  --   --   --  5.6*  --   --  5.1*  --    < > = values in this interval not displayed.   GFR: Estimated Creatinine Clearance: 11.9 mL/min (A) (by C-G formula based on SCr of 4.65 mg/dL (H)). Liver Function Tests: Recent Labs  Lab 05/17/24 1538 05/19/24 0522 05/20/24 1356  AST 25  --   --   ALT 21  --   --   ALKPHOS 142*  --   --   BILITOT 0.5  --   --   PROT 6.7  --   --   ALBUMIN  4.1 3.5 3.5   CBG: Recent Labs  Lab  05/17/24 1845 05/19/24 1112 05/20/24 0917 05/20/24 1101 05/20/24 1548  GLUCAP 78 119* 110* 98 122*    Recent Results (from the past 240 hours)  MRSA Next Gen by PCR, Nasal     Status:  None   Collection Time: 05/18/24 12:25 AM   Specimen: Nasal Mucosa; Nasal Swab  Result Value Ref Range Status   MRSA by PCR Next Gen NOT DETECTED NOT DETECTED Final    Comment: (NOTE) The GeneXpert MRSA Assay (FDA approved for NASAL specimens only), is one component of a comprehensive MRSA colonization surveillance program. It is not intended to diagnose MRSA infection nor to guide or monitor treatment for MRSA infections. Test performance is not FDA approved in patients less than 21 years old. Performed at [facility], [address] [place], [place] [address]      Radiology Studies: DG Chest 1 View Result Date: 05/20/2024 EXAM: 1 VIEW(S) XRAY OF THE CHEST 05/20/2024 01:37:10 PM COMPARISON: 10/24/2023 CLINICAL HISTORY: ESRD (end stage renal disease) (HCC) FINDINGS: LINES, TUBES AND DEVICES: Right chest wall dialysis catheter in place with tip overlying the expected region of the superior cavoatrial junction. LUNGS AND PLEURA: Chronic Right base airspace opacity. Chronic Small right pleural effusion. No pneumothorax. HEART AND MEDIASTINUM: Cardiomegaly. Atherosclerotic plaque. BONES AND SOFT TISSUES: Right shoulder arthroplasty noted. IMPRESSION: 1. Right chest wall dialysis catheter in expected position with tip at the superior cavoatrial junction. 2. Chronic right basilar airspace opacity with small right pleural effusion. Electronically signed by: Oneil Devonshire MD 05/20/2024 07:38 PM EST RP Workstation: [id number]   PERIPHERAL VASCULAR CATHETERIZATION Result Date: 05/20/2024 See surgical note for result.   Scheduled Meds:  amiodarone   200 mg Oral Daily   amLODipine   10 mg Oral Daily   Chlorhexidine  Gluconate Cloth  6 each Topical Q0600   cyanocobalamin   1,000 mcg Oral Daily    DULoxetine   20 mg Oral QHS   feeding supplement  237 mL Oral BID BM   finasteride   5 mg Oral Daily   lactulose   20 g Oral BID   levothyroxine   150 mcg Oral QAC breakfast   metoprolol  succinate  25 mg Oral BID   multivitamin with minerals  1 tablet Oral q AM   pantoprazole   40 mg Oral Daily   simvastatin   20 mg Oral Daily   sodium bicarbonate   650 mg Oral TID   sodium zirconium cyclosilicate   10 g Oral BID   tamsulosin   0.4 mg Oral Daily   Continuous Infusions:  promethazine  (PHENERGAN ) injection (IM or IVPB) Stopped (05/18/24 1845)     LOS: 3 days  MDM: Patient is high risk for one or more organ failure.  They necessitate ongoing hospitalization for continued IV therapies and subsequent lab monitoring. Total time spent interpreting labs and vitals, reviewing the medical record, coordinating care amongst consultants and care team members, directly assessing and discussing care with the patient and/or family: 55 min  Levia Waltermire, DO [facility]  To contact the attending physician between 7A-7P please use Epic Chat. To contact the covering physician during after hours 7P-7A, please review Amion.  05/21/2024, 4:36 PM   *This document has been created with the assistance of dictation software. Please excuse typographical errors. *

## 2024-05-21 NOTE — Progress Notes (Signed)
 Requested to see pt for outpt HD needs at d/c. Met with pt bedside. Introduced self and explained role. Discussed HD options. Pt prefers [facility]. Referral submitted to  [facility] for review.  Suzen Satchel Dialysis Navigator [phone]

## 2024-05-22 DIAGNOSIS — E875 Hyperkalemia: Secondary | ICD-10-CM | POA: Diagnosis not present

## 2024-05-22 LAB — RENAL FUNCTION PANEL
Albumin: 3.3 g/dL — ABNORMAL LOW (ref 3.5–5.0)
Anion gap: 10 (ref 5–15)
BUN: 37 mg/dL — ABNORMAL HIGH (ref 8–23)
CO2: 30 mmol/L (ref 22–32)
Calcium: 8.4 mg/dL — ABNORMAL LOW (ref 8.9–10.3)
Chloride: 100 mmol/L (ref 98–111)
Creatinine, Ser: 3.68 mg/dL — ABNORMAL HIGH (ref 0.61–1.24)
GFR, Estimated: 16 mL/min — ABNORMAL LOW (ref >60)
Glucose, Bld: 85 mg/dL (ref 70–99)
Phosphorus: 4.7 mg/dL — ABNORMAL HIGH (ref 2.5–4.6)
Potassium: 4.2 mmol/L (ref 3.5–5.1)
Sodium: 139 mmol/L (ref 135–145)

## 2024-05-22 LAB — CBC
HCT: 22.7 % — ABNORMAL LOW (ref 39.0–52.0)
Hemoglobin: 7.4 g/dL — ABNORMAL LOW (ref 13.0–17.0)
MCH: 29.8 pg (ref 26.0–34.0)
MCHC: 32.6 g/dL (ref 30.0–36.0)
MCV: 91.5 fL (ref 80.0–100.0)
Platelets: 212 K/uL (ref 150–400)
RBC: 2.48 MIL/uL — ABNORMAL LOW (ref 4.22–5.81)
RDW: 16.9 % — ABNORMAL HIGH (ref 11.5–15.5)
WBC: 10 K/uL (ref 4.0–10.5)
nRBC: 2.4 % — ABNORMAL HIGH (ref 0.0–0.2)

## 2024-05-22 MED ORDER — ACETAMINOPHEN 325 MG PO TABS
ORAL_TABLET | ORAL | Status: AC
  Filled 2024-05-22: qty 2

## 2024-05-22 MED ORDER — LACTULOSE 10 GM/15ML PO SOLN: 20.0000 g | Freq: Two times a day (BID) | ORAL | 0 refills | Status: DC | PRN

## 2024-05-22 MED ORDER — ONDANSETRON HCL 4 MG/2ML IJ SOLN
INTRAMUSCULAR | Status: AC
  Filled 2024-05-22: qty 2

## 2024-05-22 MED ORDER — HEPARIN SODIUM (PORCINE) 1000 UNIT/ML IJ SOLN
INTRAMUSCULAR | Status: AC
  Filled 2024-05-22: qty 5

## 2024-05-22 MED ORDER — AMLODIPINE BESYLATE 10 MG PO TABS: 10.0000 mg | ORAL_TABLET | Freq: Every day | ORAL | 0 refills | Status: AC

## 2024-05-22 NOTE — Progress Notes (Addendum)
 Patient's last BM was 12/12. Patient started on lactulose  12/16 for bowel regimen. Patient passing flatus on and has active bowel sounds. Poor PO intake per dayshift RN

## 2024-05-22 NOTE — TOC Progression Note (Signed)
 Transition of Care [facility]) - Progression Note    Patient Details  Name: [name] MRN: [id number] Date of Birth: August 29, 1940  Transition of Care [facility]) CM/SW Contact  Daved JONETTA Hamilton, RN Phone Number: 05/22/2024, 10:08 AM  Clinical Narrative:     Spoke with patient's spouse Maceo, introduced self and explained role. Discussed with Maceo plans for transporting patient once medically ready for discharge. Edna's verbalized she will be transporting him home. Discussed with Maceo this CM will notify her and [facility] once the patient has a discharge order, Maceo verbalized understanding and agreement to this plan.   Expected Discharge Plan: Home w Home Health Services Barriers to Discharge: No Barriers Identified               Expected Discharge Plan and Services       Living arrangements for the past 2 months: Single Family Home                                       Social Drivers of Health (SDOH) Interventions SDOH Screenings   Food Insecurity: No Food Insecurity (05/17/2024)  Housing: Low Risk (05/17/2024)  Transportation Needs: No Transportation Needs (05/17/2024)  Utilities: Not At Risk (05/17/2024)  Alcohol Screen: Low Risk (07/12/2021)  Depression (PHQ2-9): Low Risk (05/10/2024)  Financial Resource Strain: Low Risk  (05/14/2024)   [facility] from [facility]  Physical Activity: Inactive (07/12/2021)  Social Connections: Socially Integrated (05/17/2024)  Stress: No Stress Concern Present (07/12/2021)  Tobacco Use: Medium Risk (05/19/2024)   Received from [facility]  Health Literacy: Medium Risk (06/09/2023)   Received from [facility]    Readmission Risk Interventions    10/17/2023    3:18 PM 04/21/2023   10:16 AM  Readmission Risk Prevention Plan  Transportation Screening Complete Complete  HRI or Home Care Consult  Complete  Social Work Consult for Recovery Care Planning/Counseling  Complete  Palliative Care  Screening  Not Applicable  Medication Review Oceanographer) Complete Complete  PCP or Specialist appointment within 3-5 days of discharge Complete   SW Recovery Care/Counseling Consult Complete   Palliative Care Screening Not Applicable   Skilled Nursing Facility Not Applicable

## 2024-05-22 NOTE — Progress Notes (Signed)
°  Received patient in bed to unit.   Informed consent signed and in chart.    TX duration: 3.5hrs     Transported back to floor  Hand-off given to patient's nurse. No acute distress noted no c/o   Access used: R HD Catheter  Access issues: none   Total UF removed: 0.5L Medication(s) given: tylenol  , zofran  q Post HD VS: wnl Post HD weight: 72.3kg     Olivia Hurst LPN [facility]

## 2024-05-22 NOTE — Plan of Care (Signed)
  Problem: Education: Goal: Knowledge of General Education information will improve Description: Including pain rating scale, medication(s)/side effects and non-pharmacologic comfort measures Outcome: Adequate for Discharge   Problem: Health Behavior/Discharge Planning: Goal: Ability to manage health-related needs will improve Outcome: Adequate for Discharge   Problem: Clinical Measurements: Goal: Ability to maintain clinical measurements within normal limits will improve Outcome: Adequate for Discharge Goal: Will remain free from infection Outcome: Adequate for Discharge Goal: Diagnostic test results will improve Outcome: Adequate for Discharge Goal: Respiratory complications will improve Outcome: Adequate for Discharge Goal: Cardiovascular complication will be avoided Outcome: Adequate for Discharge   Problem: Activity: Goal: Risk for activity intolerance will decrease Outcome: Adequate for Discharge   Problem: Nutrition: Goal: Adequate nutrition will be maintained Outcome: Adequate for Discharge   Problem: Coping: Goal: Level of anxiety will decrease Outcome: Adequate for Discharge   Problem: Elimination: Goal: Will not experience complications related to bowel motility Outcome: Adequate for Discharge Goal: Will not experience complications related to urinary retention Outcome: Adequate for Discharge   Problem: Pain Managment: Goal: General experience of comfort will improve and/or be controlled Outcome: Adequate for Discharge   Problem: Safety: Goal: Ability to remain free from injury will improve Outcome: Adequate for Discharge   Problem: Skin Integrity: Goal: Risk for impaired skin integrity will decrease Outcome: Adequate for Discharge   Problem: Education: Goal: Knowledge of disease and its progression will improve Outcome: Adequate for Discharge Goal: Individualized Educational Video(s) Outcome: Adequate for Discharge   Problem: Fluid Volume: Goal:  Compliance with measures to maintain balanced fluid volume will improve Outcome: Adequate for Discharge   Problem: Health Behavior/Discharge Planning: Goal: Ability to manage health-related needs will improve Outcome: Adequate for Discharge   Problem: Nutritional: Goal: Ability to make healthy dietary choices will improve Outcome: Adequate for Discharge   Problem: Clinical Measurements: Goal: Complications related to the disease process, condition or treatment will be avoided or minimized Outcome: Adequate for Discharge

## 2024-05-22 NOTE — Plan of Care (Signed)

## 2024-05-22 NOTE — Progress Notes (Signed)
 PT Cancellation Note  Patient Details Name: RIPLEY BOGOSIAN MRN: [id number] DOB: 06-29-1940   Cancelled Treatment:     Pt sleeping heavily after returning from dialysis. Will re-attempt tomorrow if able. Continue PT per POC.    Darice JAYSON Bohr 05/22/2024, 2:41 PM

## 2024-05-22 NOTE — TOC Transition Note (Signed)
 Transition of Care [facility]) - Discharge Note   Patient Details  Name: [name] MRN: [id number] Date of Birth: June 20, 1940  Transition of Care [facility]) CM/SW Contact:  Daved JONETTA Hamilton, RN Phone Number: 05/22/2024, 1:00 PM   Clinical Narrative:     Patient will DC to: Home with [facility] Anticipated DC date: 05/22/2024 Family notified: Maceo Hurst Transport by: Maceo Hurst  Per MD patient ready for DC to Home with [facility] . RN, patient, patient's family, and agency notified of DC. Home Health order sent to agency. Patient's spouse Maceo reminded of patient's appointment at [facility] on 05/24/24 at 11:30am for new patient paperwork and 12:00pm chair time, Maceo verbalized understanding.   TOC signing off.   Final next level of care: Home w Home Health Services Barriers to Discharge: Barriers Resolved   Patient Goals and CMS Choice            Discharge Placement                  Name of family member notified: [place] Patient and family notified of of transfer: 05/22/24  Discharge Plan and Services Additional resources added to the After Visit Summary for                              [facility] Agency: [facility] (Patient is active with [facility]) Date [facility] Agency Contacted: 05/22/24 Time HH Agency Contacted: [id number] Representative spoke with at [facility] Agency: [place]  Social Drivers of Health (SDOH) Interventions SDOH Screenings   Food Insecurity: No Food Insecurity (05/17/2024)  Housing: Low Risk (05/17/2024)  Transportation Needs: No Transportation Needs (05/17/2024)  Utilities: Not At Risk (05/17/2024)  Alcohol Screen: Low Risk (07/12/2021)  Depression (PHQ2-9): Low Risk (05/10/2024)  Financial Resource Strain: Low Risk  (05/14/2024)   Received from [facility]  Physical Activity: Inactive (07/12/2021)  Social Connections: Socially Integrated (05/17/2024)  Stress: No Stress Concern Present (07/12/2021)  Tobacco Use: Medium  Risk (05/19/2024)   Received from [facility]  Health Literacy: Medium Risk (06/09/2023)   Received from [facility]     Readmission Risk Interventions    10/17/2023    3:18 PM 04/21/2023   10:16 AM  Readmission Risk Prevention Plan  Transportation Screening Complete Complete  HRI or Home Care Consult  Complete  Social Work Consult for Recovery Care Planning/Counseling  Complete  Palliative Care Screening  Not Applicable  Medication Review Oceanographer) Complete Complete  PCP or Specialist appointment within 3-5 days of discharge Complete   SW Recovery Care/Counseling Consult Complete   Palliative Care Screening Not Applicable   Skilled Nursing Facility Not Applicable

## 2024-05-22 NOTE — Progress Notes (Signed)
 [facility]  ROUNDING NOTE   Subjective:   Patient with past medical history of chronic kidney disease stage V, CVA, myelofibrosis, hypertension, upper lipidemia, coronary disease, history of myocardial infarction, diastolic heart failure, hypothyroidism, melanoma, mild cognitive impairment, BPH, atrial fibrillation, nephrolithiasis, history of pericarditis who presented with weakness and fall.  Patient well-known to my partner from the office.  Dr. Dennise has been following him closely.  Most recent outpatient eGFR was 13.  Renal function worse now with eGFR of 9.  Patient and Dr. Dennise have had conversations regarding renal placement therapy.  It appears that the patient is open to starting renal placement therapy at this time.  Update: Patient underwent third dialysis treatment today.  Tolerated well.  Outpatient dialysis seat has been secured at [facility].  Objective:  Vital signs in last 24 hours:  Temp:  [98.2 F (36.8 C)-98.6 F (37 C)] 98.3 F (36.8 C) (12/17 1222) Pulse Rate:  [64-75] 64 (12/17 1222) Resp:  [9-17] 15 (12/17 1222) BP: (105-155)/(59-86) 127/67 (12/17 1222) SpO2:  [93 %-98 %] 97 % (12/17 1222) Weight:  [70 kg-72.8 kg] 72.3 kg (12/17 1228)  Weight change: -2.4 kg Filed Weights   05/22/24 0500 05/22/24 0824 05/22/24 1228  Weight: 70 kg 72.8 kg 72.3 kg    Intake/Output: I/O last 3 completed shifts: In: 200 [P.O.:200] Out: 900 [Urine:900]   Intake/Output this shift:  Total I/O In: -  Out: 500 [Other:500]  Physical Exam: General: No acute distress  Head: Normocephalic, atraumatic. Moist oral mucosal membranes  Neck: Supple  Lungs:  Clear to auscultation, normal effort  Heart: S1S2 no rubs  Abdomen:  Soft, nontender, bowel sounds present  Extremities: No peripheral edema.  Neurologic: Awake, alert, following commands  Skin: No acute rash  Access: Right IJ PermCath    Basic Metabolic Panel: Recent Labs  Lab  05/17/24 1538 05/17/24 1809 05/17/24 2213 05/19/24 0522 05/19/24 1525 05/20/24 0512 05/20/24 1356 05/21/24 0657 05/22/24 0900  NA 138  --    < > 140  --  139 137 138 139  K 6.1*  --    < > 6.5* 5.7* 6.4* 5.4* 5.0 4.2  CL 104  --    < > 105  --  104 101 101 100  CO2 20*  --    < > 22  --  24 24 26 30   GLUCOSE 96  --    < > 83  --  91 105* 124* 85  BUN 81*  --    < > 73*  --  76* 74* 60* 37*  CREATININE 5.77*  --    < > 5.03*  --  5.23* 5.19* 4.65* 3.68*  CALCIUM  9.0  --    < > 8.8*  --  8.7* 8.6* 8.7* 8.4*  MG  --  2.1  --   --   --   --   --   --   --   PHOS 5.3*  --   --  5.6*  --   --  5.1*  --  4.7*   < > = values in this interval not displayed.    Liver Function Tests: Recent Labs  Lab 05/17/24 1538 05/19/24 0522 05/20/24 1356 05/22/24 0900  AST 25  --   --   --   ALT 21  --   --   --   ALKPHOS 142*  --   --   --   BILITOT 0.5  --   --   --  PROT 6.7  --   --   --   ALBUMIN  4.1 3.5 3.5 3.3*   No results for input(s): LIPASE, AMYLASE in the last 168 hours. No results for input(s): AMMONIA in the last 168 hours.  CBC: Recent Labs  Lab 05/17/24 1538 05/18/24 0517 05/19/24 0522 05/20/24 1356 05/22/24 0900  WBC 14.7* 14.0* 11.3* 11.5* 10.0  NEUTROABS 8.5*  --   --   --   --   HGB 9.7* 8.7* 9.0* 8.8* 7.4*  HCT 30.7* 26.4* 28.7* 27.1* 22.7*  MCV 94.2 91.7 96.6 91.9 91.5  PLT 321 299 272 239 212    Cardiac Enzymes: No results for input(s): CKTOTAL, CKMB, CKMBINDEX, TROPONINI in the last 168 hours.  BNP: Invalid input(s): POCBNP  CBG: Recent Labs  Lab 05/17/24 1845 05/19/24 1112 05/20/24 0917 05/20/24 1101 05/20/24 1548  GLUCAP 78 119* 110* 98 122*    Microbiology: Results for orders placed or performed during the hospital encounter of 05/17/24  MRSA Next Gen by PCR, Nasal     Status: None   Collection Time: 05/18/24 12:25 AM   Specimen: Nasal Mucosa; Nasal Swab  Result Value Ref Range Status   MRSA by PCR Next Gen NOT  DETECTED NOT DETECTED Final    Comment: (NOTE) The GeneXpert MRSA Assay (FDA approved for NASAL specimens only), is one component of a comprehensive MRSA colonization surveillance program. It is not intended to diagnose MRSA infection nor to guide or monitor treatment for MRSA infections. Test performance is not FDA approved in patients less than 26 years old. Performed at [facility], [address] [place], [place] [address]    *Note: Due to a large number of results and/or encounters for the requested time period, some results have not been displayed. A complete set of results can be found in Results Review.    Coagulation Studies: No results for input(s): LABPROT, INR in the last 72 hours.  Urinalysis: No results for input(s): COLORURINE, LABSPEC, PHURINE, GLUCOSEU, HGBUR, BILIRUBINUR, KETONESUR, PROTEINUR, UROBILINOGEN, NITRITE, LEUKOCYTESUR in the last 72 hours.  Invalid input(s): APPERANCEUR     Imaging: No results found.     Medications:    promethazine  (PHENERGAN ) injection (IM or IVPB) Stopped (05/18/24 1845)    amiodarone   200 mg Oral Daily   amLODipine   10 mg Oral Daily   Chlorhexidine  Gluconate Cloth  6 each Topical Q0600   cyanocobalamin   1,000 mcg Oral Daily   DULoxetine   20 mg Oral QHS   feeding supplement  237 mL Oral BID BM   finasteride   5 mg Oral Daily   lactulose   20 g Oral BID   levothyroxine   150 mcg Oral QAC breakfast   metoprolol  succinate  25 mg Oral BID   multivitamin with minerals  1 tablet Oral q AM   pantoprazole   40 mg Oral Daily   simvastatin   20 mg Oral Daily   sodium bicarbonate   650 mg Oral TID   tamsulosin   0.4 mg Oral Daily   acetaminophen , hydrALAZINE , morphine  injection, ondansetron  (ZOFRAN ) IV, promethazine  (PHENERGAN ) injection (IM or IVPB), traMADol , traZODone   Assessment/ Plan:  83 y.o. male chronic kidney disease stage V, CVA, myelofibrosis, hypertension, upper lipidemia, coronary  disease, history of myocardial infarction, diastolic heart failure, hypothyroidism, melanoma, mild cognitive impairment, BPH, atrial fibrillation, nephrolithiasis, history of pericarditis who presented with weakness and fall.  Patient found to have worsening underlying chronic kidney disease.  1.  Chronic kidney disease stage V.  Patient has ongoing severe renal  dysfunction.  Most recent outpatient eGFR was 13 on 03/14/2024.  eGFR now down to 9.  Patient having weakness and fatigue.  PermCath placed 05/20/2024. Update: Patient completed initiation of dialysis treatments and underwent third dialysis treatment today.  His outpatient seat has been secured at [facility] and his first treatment will be on Friday as outpatient.  2.  Hyperkalemia.  Potassium now normalized to 4.2.  No need for any further Lokelma .  Monitor potassium as outpatient.  3.  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  Start the patient on Mircera as outpatient.  4.  Secondary hyperparathyroidism.  Phosphorus down to 4.7 and acceptable.   LOS: 4 Argelia Formisano 12/17/20251:57 PM

## 2024-05-22 NOTE — Discharge Summary (Signed)
 DISCHARGE SUMMARY    [name] FMW:[id number] DOB: 08/04/1940 DOA: 05/17/2024  PCP: Vicci Duwaine SQUIBB, DO  Admit date: 05/17/2024 Discharge date: 05/22/2024   Recommendations for Outpatient Follow-up:  Follow-up with outpatient hemodialysis as scheduled on 12/19 Outpatient follow-up with primary care for chronic condition management Outpatient follow-up with nephrology for continued kidney monitoring   Hospital Course: [name] is an 83 year old male with stage IV CKD, prior CVA, myelofibrosis, hypertension, hyperlipidemia, CAD, prior STEMI, chronic diastolic CHF, hypothyroidism, melanoma, mild cognitive impairment, BPH, chronic left eye blurry vision, subdural hematoma, A-fib not on anticoagulation, history of retinal detachment, history of pericarditis, chronic bilateral knee pain.  Knee presented to the hospital with generalized weakness and a fall after suffering 2 falls at home. Head CT on arrival did not reveal any acute intracranial abnormality.  It did reveal chronically displaced fracture of the right posterior arch of C1. He was found to have worsening hyperkalemia and worsening kidney dysfunction.  Creatinine 5.7, potassium 6.1.  Nephrology was consulted.  Patient consented to start dialysis.  He underwent permacath placement on 12/15 with vascular surgery.  Subsequently, he underwent 3 sessions of dialysis without issue.  Outpatient chair time was secured to start 12/19 at [facility] Monday Wednesday Friday.  Chair time 12 PM.  Patient was given the instructions to follow-up.  SNF was offered but he has declined.  Home health was arranged prior to DC   Hyperkalemia - Has been receiving shifters - Now well-controlled with dialysis - Continue to monitor   CKD stage IV - Newly declared ESRD - Has received permacath and tolerated dialysis x 3 -- Cleared by nephrology for discharge  Pt accepted at [facility] on MWF at 12pm. Pt can start  05/24/24 and will need to arrive at 11:30am for new pt paperwork.     Anemia of chronic disease Myelofibrosis - Hemoglobin currently stable. - Monitor CBC closely - To be started on Micera per nephro - Previously was on hospice and his wife reports he got better, follows outpatient with hematology   Paroxysmal atrial fibrillation - Currently rate controlled - Continue metoprolol  and amiodarone  - Not on AC at home due to history of SDH   Generalized weakness Recurrent falls - PT/OT, currently recommending SNF but pt is refusing.  Prefers home health.  Home health ordered.   Chronic diastolic CHF - Echo February 2025: EF 50 to 55%, grade 1 diastolic dysfunction - Euvolemic at this time - Lasix  as needed - Additional volume management per HD   Hyperlipidemia - Continue statin - LDL 62   Leukocytosis - Likely reactive, no evidence of infection   Chronic C1 arch fracture - PT/OT as above   Hypertension - Blood pressure stable on current meds.  New Rx sent at DC   History of CVA History of SDH Chronic blurry vision in left eye - Resume all home meds   BPH - Resume home meds  Discharge Instructions  Discharge Instructions     Call MD for:  difficulty breathing, headache or visual disturbances   Complete by: As directed    Call MD for:  persistant dizziness or light-headedness   Complete by: As directed    Call MD for:  persistant nausea and vomiting   Complete by: As directed    Call MD for:  severe uncontrolled pain   Complete by: As directed    Call MD for:  temperature >100.4   Complete by: As directed  Diet general   Complete by: As directed    Discharge instructions   Complete by: As directed    Please be sure to show up for your next dialysis session:  [facility] on MWF at 12pm 05/24/24 You will need to arrive at 1130 for new patient paperwork.    Please follow-up with nephrology as scheduled   Increase activity slowly   Complete by: As  directed    No wound care   Complete by: As directed       Allergies as of 05/22/2024       Reactions   Quinolones Other (See Comments)   Patient with aortic aneurysm. Use of quinolones are considered to be contraindicated due to risk of aortic rupture.   Meloxicam Nausea And Vomiting        Medication List     STOP taking these medications    furosemide  40 MG tablet Commonly known as: LASIX    gabapentin  400 MG capsule Commonly known as: NEURONTIN    latanoprost  0.005 % ophthalmic solution Commonly known as: XALATAN    lenalidomide  5 MG capsule Commonly known as: REVLIMID    lidocaine  5 % Commonly known as: LIDODERM    midodrine  5 MG tablet Commonly known as: PROAMATINE    QUEtiapine  25 MG tablet Commonly known as: SEROQUEL        TAKE these medications    acetaminophen  500 MG tablet Commonly known as: TYLENOL  Take 1,000 mg by mouth every 6 (six) hours as needed for mild pain (pain score 1-3).   amiodarone  200 MG tablet Commonly known as: PACERONE  Take 1 tablet (200 mg total) by mouth daily.   amLODipine  10 MG tablet Commonly known as: NORVASC  Take 1 tablet (10 mg total) by mouth daily. What changed:  medication strength how much to take   cyanocobalamin  1000 MCG tablet Commonly known as: VITAMIN B12 Take 1 tablet (1,000 mcg total) by mouth daily.   DULoxetine  20 MG capsule Commonly known as: CYMBALTA  Take 1 capsule (20 mg total) by mouth at bedtime.   finasteride  5 MG tablet Commonly known as: PROSCAR  Take 5 mg by mouth daily.   lactulose  10 GM/15ML solution Commonly known as: CHRONULAC  Take 30 mLs (20 g total) by mouth 2 (two) times daily as needed for mild constipation.   levothyroxine  150 MCG tablet Commonly known as: SYNTHROID  Take 1 tablet (150 mcg total) by mouth daily before breakfast.   metoprolol  succinate 25 MG 24 hr tablet Commonly known as: TOPROL -XL Take 1 tablet (25 mg total) by mouth 2 (two) times daily.   multivitamin  with minerals Tabs tablet Take 1 tablet by mouth in the morning.   ondansetron  4 MG tablet Commonly known as: ZOFRAN  Take 1 tablet (4 mg total) by mouth every 6 (six) hours as needed for nausea.   pantoprazole  40 MG tablet Commonly known as: PROTONIX  Take 1 tablet (40 mg total) by mouth daily.   simvastatin  40 MG tablet Commonly known as: ZOCOR  Take 1 tablet (40 mg total) by mouth daily.   sodium bicarbonate  650 MG tablet Take 1 tablet (650 mg total) by mouth 3 (three) times daily.   tamsulosin  0.4 MG Caps capsule Commonly known as: FLOMAX  Take 1 capsule (0.4 mg total) by mouth daily.   traMADol  50 MG tablet Commonly known as: ULTRAM  Take 1 tablet (50 mg total) by mouth every 12 (twelve) hours as needed for moderate pain (pain score 4-6).   triamcinolone  ointment 0.5 % Commonly known as: KENALOG  Apply 1 Application topically 2 (  two) times daily.        Contact information for follow-up providers     Dialysis, [facility] Follow up on 05/24/2024.   Why: Pt dialysis days are Monday, Wednesday, Friday at 12:00p chair time.  Pt would need to arrive at 11:30am  for the first day for new pt paperwork. Contact information: [address] [place] [address] [phone]              Contact information for after-discharge care     Home Medical Care     [facility]) .   Service: Home Health Services                    Allergies[1]  Consultations:    Procedures/Studies: DG Chest 1 View Result Date: 05/20/2024 EXAM: 1 VIEW(S) XRAY OF THE CHEST 05/20/2024 01:37:10 PM COMPARISON: 10/24/2023 CLINICAL HISTORY: ESRD (end stage renal disease) (HCC) FINDINGS: LINES, TUBES AND DEVICES: Right chest wall dialysis catheter in place with tip overlying the expected region of the superior cavoatrial junction. LUNGS AND PLEURA: Chronic Right base airspace opacity. Chronic Small right pleural effusion. No pneumothorax. HEART AND  MEDIASTINUM: Cardiomegaly. Atherosclerotic plaque. BONES AND SOFT TISSUES: Right shoulder arthroplasty noted. IMPRESSION: 1. Right chest wall dialysis catheter in expected position with tip at the superior cavoatrial junction. 2. Chronic right basilar airspace opacity with small right pleural effusion. Electronically signed by: Oneil Devonshire MD 05/20/2024 07:38 PM EST RP Workstation: [id number]   PERIPHERAL VASCULAR CATHETERIZATION Result Date: 05/20/2024 See surgical note for result.  DG Forearm Left Result Date: 05/17/2024 CLINICAL DATA:  Fall, laceration EXAM: DG FOREARM 2V*L* COMPARISON:  None Available. FINDINGS: No definitive fracture or malalignment. Moderate degenerative change at the radiocarpal joint. Prominent cysts in the carpal bones. Soft tissue swelling at the wrist. IMPRESSION: No acute osseous abnormality. Electronically Signed   By: Luke Bun M.D.   On: 05/17/2024 18:49   CT Head Wo Contrast Result Date: 05/17/2024 CLINICAL DATA:  Head trauma EXAM: CT HEAD WITHOUT CONTRAST CT CERVICAL SPINE WITHOUT CONTRAST TECHNIQUE: Multidetector CT imaging of the head and cervical spine was performed following the standard protocol without intravenous contrast. Multiplanar CT image reconstructions of the cervical spine were also generated. RADIATION DOSE REDUCTION: This exam was performed according to the departmental dose-optimization program which includes automated exposure control, adjustment of the mA and/or kV according to patient size and/or use of iterative reconstruction technique. COMPARISON:  CT brain 05/09/2024 FINDINGS: CT HEAD FINDINGS Brain: No acute territorial infarction, hemorrhage or intracranial mass. Atrophy and mild chronic small vessel ischemic changes of the white matter. Stable ventricle size Vascular: No hyperdense vessels.  Carotid vascular calcification Skull: Normal. Negative for fracture or focal lesion. Sinuses/Orbits: No acute finding. Other: None CT CERVICAL SPINE  FINDINGS Alignment: Mild reversal of cervical lordosis. 4 mm anterolisthesis C5 on C6, stable compared with prior CT from April. Facet alignment is within normal limits. Skull base and vertebrae: Vertebral body heights are grossly maintained. Again visualized is nondisplaced fracture involving the right posterior arch of C1, with interval sclerotic margin, series 2, image 26, this is chronic compared to the CT from April. No new fracture is seen. Soft tissues and spinal canal: No prevertebral fluid or swelling. No visible canal hematoma. Disc levels: Partial interbody ankylosis C2-C3 and C4-C5 with facet ankylosis at these levels. Diffuse advanced disc space narrowing C2 through T1. Heterogeneous vertebral sclerosis also without change. Hypertrophic facet degenerative changes at multiple levels  with multilevel moderate severe foraminal stenosis. Upper chest: Negative. Other: None IMPRESSION: 1. No CT evidence for acute intracranial abnormality. Atrophy and chronic small vessel ischemic changes of the white matter. 2. Mild reversal of cervical lordosis with chronic 4 mm anterolisthesis C5 on C6. No acute osseous abnormality. Advanced multilevel degenerative changes. 3. Chronic nondisplaced fracture involving the right posterior arch of C1. Electronically Signed   By: Luke Bun M.D.   On: 05/17/2024 18:48   CT Cervical Spine Wo Contrast Result Date: 05/17/2024 CLINICAL DATA:  Head trauma EXAM: CT HEAD WITHOUT CONTRAST CT CERVICAL SPINE WITHOUT CONTRAST TECHNIQUE: Multidetector CT imaging of the head and cervical spine was performed following the standard protocol without intravenous contrast. Multiplanar CT image reconstructions of the cervical spine were also generated. RADIATION DOSE REDUCTION: This exam was performed according to the departmental dose-optimization program which includes automated exposure control, adjustment of the mA and/or kV according to patient size and/or use of iterative  reconstruction technique. COMPARISON:  CT brain 05/09/2024 FINDINGS: CT HEAD FINDINGS Brain: No acute territorial infarction, hemorrhage or intracranial mass. Atrophy and mild chronic small vessel ischemic changes of the white matter. Stable ventricle size Vascular: No hyperdense vessels.  Carotid vascular calcification Skull: Normal. Negative for fracture or focal lesion. Sinuses/Orbits: No acute finding. Other: None CT CERVICAL SPINE FINDINGS Alignment: Mild reversal of cervical lordosis. 4 mm anterolisthesis C5 on C6, stable compared with prior CT from April. Facet alignment is within normal limits. Skull base and vertebrae: Vertebral body heights are grossly maintained. Again visualized is nondisplaced fracture involving the right posterior arch of C1, with interval sclerotic margin, series 2, image 26, this is chronic compared to the CT from April. No new fracture is seen. Soft tissues and spinal canal: No prevertebral fluid or swelling. No visible canal hematoma. Disc levels: Partial interbody ankylosis C2-C3 and C4-C5 with facet ankylosis at these levels. Diffuse advanced disc space narrowing C2 through T1. Heterogeneous vertebral sclerosis also without change. Hypertrophic facet degenerative changes at multiple levels with multilevel moderate severe foraminal stenosis. Upper chest: Negative. Other: None IMPRESSION: 1. No CT evidence for acute intracranial abnormality. Atrophy and chronic small vessel ischemic changes of the white matter. 2. Mild reversal of cervical lordosis with chronic 4 mm anterolisthesis C5 on C6. No acute osseous abnormality. Advanced multilevel degenerative changes. 3. Chronic nondisplaced fracture involving the right posterior arch of C1. Electronically Signed   By: Luke Bun M.D.   On: 05/17/2024 18:48   CT HEAD WO CONTRAST Result Date: 05/09/2024 EXAM: CT HEAD WITHOUT CONTRAST 05/09/2024 03:46:04 PM TECHNIQUE: CT of the head was performed without the administration of  intravenous contrast. Automated exposure control, iterative reconstruction, and/or weight based adjustment of the mA/kV was utilized to reduce the radiation dose to as low as reasonably achievable. COMPARISON: CT of the head dated 06/26/2023. CLINICAL HISTORY: htn dizzy FINDINGS: BRAIN AND VENTRICLES: No acute hemorrhage. No evidence of acute infarct. No hydrocephalus. No extra-axial collection. No mass effect or midline shift. There is age-related volume loss and mild periventricular white matter disease. There is moderate calcification within the carotid siphons. ORBITS: The patient is status post bilateral lens replacement and right-sided scleral banding. SINUSES: No acute abnormality. SOFT TISSUES AND SKULL: No acute soft tissue abnormality. No skull fracture. IMPRESSION: 1. No acute intracranial abnormality. 2. Age-related volume loss and mild periventricular white matter disease. 3. Moderate calcification within the carotid siphons. Electronically signed by: Evalene Coho MD 05/09/2024 03:57 PM EST RP Workstation: [id number]  Discharge Exam: Vitals:   05/22/24 1200 05/22/24 1222  BP: 109/67 127/67  Pulse: 73 64  Resp: 11 15  Temp:  98.3 F (36.8 C)  SpO2: 98% 97%   Vitals:   05/22/24 1130 05/22/24 1200 05/22/24 1222 05/22/24 1228  BP: 105/61 109/67 127/67   Pulse: 67 73 64   Resp: 16 11 15    Temp:   98.3 F (36.8 C)   TempSrc:   Oral   SpO2: 97% 98% 97%   Weight:    72.3 kg  Height:        Constitutional:  Normal appearance. Non toxic-appearing.  HENT: Head Normocephalic and atraumatic.  Mucous membranes are moist.  Eyes:  Extraocular intact. Conjunctivae normal.  Cardiovascular: Rate and Rhythm: Normal rate and regular rhythm.  Pulmonary: Non labored, symmetric rise of chest wall.  Skin: warm and dry. not jaundiced.  Neurological: Alert, oriented Psychiatric: Mood and Affect congruent.    The results of significant diagnostics from this hospitalization (including  imaging, microbiology, ancillary and laboratory) are listed below for reference.     Microbiology: Recent Results (from the past 240 hours)  MRSA Next Gen by PCR, Nasal     Status: None   Collection Time: 05/18/24 12:25 AM   Specimen: Nasal Mucosa; Nasal Swab  Result Value Ref Range Status   MRSA by PCR Next Gen NOT DETECTED NOT DETECTED Final    Comment: (NOTE) The GeneXpert MRSA Assay (FDA approved for NASAL specimens only), is one component of a comprehensive MRSA colonization surveillance program. It is not intended to diagnose MRSA infection nor to guide or monitor treatment for MRSA infections. Test performance is not FDA approved in patients less than 81 years old. Performed at [facility], [address] [place], [place] [address]      Labs: BNP (last 3 results) Recent Labs    10/16/23 1028  BNP 1,435.7*   Basic Metabolic Panel: Recent Labs  Lab 05/17/24 1538 05/17/24 1809 05/17/24 2213 05/19/24 0522 05/19/24 1525 05/20/24 0512 05/20/24 1356 05/21/24 0657 05/22/24 0900  NA 138  --    < > 140  --  139 137 138 139  K 6.1*  --    < > 6.5* 5.7* 6.4* 5.4* 5.0 4.2  CL 104  --    < > 105  --  104 101 101 100  CO2 20*  --    < > 22  --  24 24 26 30   GLUCOSE 96  --    < > 83  --  91 105* 124* 85  BUN 81*  --    < > 73*  --  76* 74* 60* 37*  CREATININE 5.77*  --    < > 5.03*  --  5.23* 5.19* 4.65* 3.68*  CALCIUM  9.0  --    < > 8.8*  --  8.7* 8.6* 8.7* 8.4*  MG  --  2.1  --   --   --   --   --   --   --   PHOS 5.3*  --   --  5.6*  --   --  5.1*  --  4.7*   < > = values in this interval not displayed.   Liver Function Tests: Recent Labs  Lab 05/17/24 1538 05/19/24 0522 05/20/24 1356 05/22/24 0900  AST 25  --   --   --   ALT 21  --   --   --   ALKPHOS 142*  --   --   --  BILITOT 0.5  --   --   --   PROT 6.7  --   --   --   ALBUMIN  4.1 3.5 3.5 3.3*   No results for input(s): LIPASE, AMYLASE in the last 168 hours. No results for input(s):  AMMONIA in the last 168 hours. CBC: Recent Labs  Lab 05/17/24 1538 05/18/24 0517 05/19/24 0522 05/20/24 1356 05/22/24 0900  WBC 14.7* 14.0* 11.3* 11.5* 10.0  NEUTROABS 8.5*  --   --   --   --   HGB 9.7* 8.7* 9.0* 8.8* 7.4*  HCT 30.7* 26.4* 28.7* 27.1* 22.7*  MCV 94.2 91.7 96.6 91.9 91.5  PLT 321 299 272 239 212   Cardiac Enzymes: No results for input(s): CKTOTAL, CKMB, CKMBINDEX, TROPONINI in the last 168 hours. BNP: Invalid input(s): POCBNP CBG: Recent Labs  Lab 05/17/24 1845 05/19/24 1112 05/20/24 0917 05/20/24 1101 05/20/24 1548  GLUCAP 78 119* 110* 98 122*   D-Dimer No results for input(s): DDIMER in the last 72 hours. Hgb A1c No results for input(s): HGBA1C in the last 72 hours. Lipid Profile No results for input(s): CHOL, HDL, LDLCALC, TRIG, CHOLHDL, LDLDIRECT in the last 72 hours. Thyroid  function studies No results for input(s): TSH, T4TOTAL, T3FREE, THYROIDAB in the last 72 hours.  Invalid input(s): FREET3 Anemia work up No results for input(s): VITAMINB12, FOLATE, FERRITIN, TIBC, IRON, RETICCTPCT in the last 72 hours. Urinalysis    Component Value Date/Time   COLORURINE YELLOW (A) 05/17/2024 1811   APPEARANCEUR CLEAR (A) 05/17/2024 1811   APPEARANCEUR Clear 04/10/2024 1158   LABSPEC 1.016 05/17/2024 1811   PHURINE 6.0 05/17/2024 1811   GLUCOSEU NEGATIVE 05/17/2024 1811   HGBUR NEGATIVE 05/17/2024 1811   BILIRUBINUR NEGATIVE 05/17/2024 1811   BILIRUBINUR Negative 04/10/2024 1158   KETONESUR NEGATIVE 05/17/2024 1811   PROTEINUR >=300 (A) 05/17/2024 1811   NITRITE NEGATIVE 05/17/2024 1811   LEUKOCYTESUR NEGATIVE 05/17/2024 1811   Sepsis Labs Recent Labs  Lab 05/18/24 0517 05/19/24 0522 05/20/24 1356 05/22/24 0900  WBC 14.0* 11.3* 11.5* 10.0   Microbiology Recent Results (from the past 240 hours)  MRSA Next Gen by PCR, Nasal     Status: None   Collection Time: 05/18/24 12:25 AM   Specimen:  Nasal Mucosa; Nasal Swab  Result Value Ref Range Status   MRSA by PCR Next Gen NOT DETECTED NOT DETECTED Final    Comment: (NOTE) The GeneXpert MRSA Assay (FDA approved for NASAL specimens only), is one component of a comprehensive MRSA colonization surveillance program. It is not intended to diagnose MRSA infection nor to guide or monitor treatment for MRSA infections. Test performance is not FDA approved in patients less than 3 years old. Performed at [facility], [address] [place], [place] [address]      Time coordinating discharge: 32 min   SIGNED: Lorane Poland, DO [facility] 05/22/2024, 12:45 PM Pager   If 7PM-7AM, please contact night-coverage     [1]  Allergies Allergen Reactions   Quinolones Other (See Comments)    Patient with aortic aneurysm. Use of quinolones are considered to be contraindicated due to risk of aortic rupture.   Meloxicam Nausea And Vomiting

## 2024-05-23 ENCOUNTER — Ambulatory Visit: Admitting: Family Medicine

## 2024-05-23 ENCOUNTER — Ambulatory Visit: Payer: Self-pay

## 2024-05-23 ENCOUNTER — Other Ambulatory Visit: Payer: Self-pay

## 2024-05-23 NOTE — Telephone Encounter (Signed)
Scheduled for 12.24

## 2024-05-23 NOTE — Telephone Encounter (Signed)
 Unfortunately I will not be able to see him until the new year then.

## 2024-05-23 NOTE — Telephone Encounter (Signed)
 Not appropriate. Needs in person appointment. OK to move to 12/24

## 2024-05-23 NOTE — Telephone Encounter (Signed)
 Answer Assessment - Initial Assessment Questions Wife requesting virtual for this pt d/t patient being Dc'd from hospital last night. Please call to advise.  Protocols used: Information Only Call - No Triage-A-AH

## 2024-05-24 ENCOUNTER — Emergency Department
Admission: EM | Admit: 2024-05-24 | Discharge: 2024-05-24 | Disposition: A | Discharge status: Home / Self Care | Source: Home / Self Care | Attending: Emergency Medicine | Admitting: Emergency Medicine

## 2024-05-24 ENCOUNTER — Other Ambulatory Visit: Payer: Self-pay

## 2024-05-24 ENCOUNTER — Telehealth

## 2024-05-24 DIAGNOSIS — Z992 Dependence on renal dialysis: Secondary | ICD-10-CM | POA: Insufficient documentation

## 2024-05-24 DIAGNOSIS — I120 Hypertensive chronic kidney disease with stage 5 chronic kidney disease or end stage renal disease: Secondary | ICD-10-CM | POA: Insufficient documentation

## 2024-05-24 DIAGNOSIS — R42 Dizziness and giddiness: Secondary | ICD-10-CM | POA: Diagnosis present

## 2024-05-24 DIAGNOSIS — R531 Weakness: Secondary | ICD-10-CM | POA: Diagnosis not present

## 2024-05-24 DIAGNOSIS — N186 End stage renal disease: Secondary | ICD-10-CM | POA: Diagnosis not present

## 2024-05-24 DIAGNOSIS — R55 Syncope and collapse: Secondary | ICD-10-CM | POA: Diagnosis not present

## 2024-05-24 LAB — URINALYSIS, COMPLETE (UACMP) WITH MICROSCOPIC
Bilirubin Urine: NEGATIVE
Glucose, UA: NEGATIVE mg/dL
Hgb urine dipstick: NEGATIVE
Ketones, ur: NEGATIVE mg/dL
Leukocytes,Ua: NEGATIVE
Nitrite: NEGATIVE
Protein, ur: >=300 mg/dL — AB
Specific Gravity, Urine: 1.02 (ref 1.005–1.030)
pH: 6 (ref 5.0–8.0)

## 2024-05-24 LAB — TROPONIN T, HIGH SENSITIVITY
Troponin T High Sensitivity: 98 ng/L — ABNORMAL HIGH (ref 0–19)
Troponin T High Sensitivity: 90 ng/L — ABNORMAL HIGH (ref 0–19)

## 2024-05-24 LAB — CBC
HCT: 24.5 % — ABNORMAL LOW (ref 39.0–52.0)
Hemoglobin: 7.7 g/dL — ABNORMAL LOW (ref 13.0–17.0)
MCH: 29.5 pg (ref 26.0–34.0)
MCHC: 31.4 g/dL (ref 30.0–36.0)
MCV: 93.9 fL (ref 80.0–100.0)
Platelets: 203 K/uL (ref 150–400)
RBC: 2.61 MIL/uL — ABNORMAL LOW (ref 4.22–5.81)
RDW: 16.9 % — ABNORMAL HIGH (ref 11.5–15.5)
WBC: 13.1 K/uL — ABNORMAL HIGH (ref 4.0–10.5)
nRBC: 1.9 % — ABNORMAL HIGH (ref 0.0–0.2)

## 2024-05-24 LAB — RESP PANEL BY RT-PCR (RSV, FLU A&B, COVID)  RVPGX2
Influenza A by PCR: NEGATIVE
Influenza B by PCR: NEGATIVE
Resp Syncytial Virus by PCR: NEGATIVE
SARS Coronavirus 2 by RT PCR: NEGATIVE

## 2024-05-24 LAB — COMPREHENSIVE METABOLIC PANEL WITH GFR
ALT: 17 U/L (ref 0–44)
AST: 36 U/L (ref 15–41)
Albumin: 3.8 g/dL (ref 3.5–5.0)
Alkaline Phosphatase: 123 U/L (ref 38–126)
Anion gap: 16 — ABNORMAL HIGH (ref 5–15)
BUN: 35 mg/dL — ABNORMAL HIGH (ref 8–23)
CO2: 23 mmol/L (ref 22–32)
Calcium: 8 mg/dL — ABNORMAL LOW (ref 8.9–10.3)
Chloride: 97 mmol/L — ABNORMAL LOW (ref 98–111)
Creatinine, Ser: 4.23 mg/dL — ABNORMAL HIGH (ref 0.61–1.24)
GFR, Estimated: 13 mL/min — ABNORMAL LOW
Glucose, Bld: 106 mg/dL — ABNORMAL HIGH (ref 70–99)
Potassium: 4.8 mmol/L (ref 3.5–5.1)
Sodium: 136 mmol/L (ref 135–145)
Total Bilirubin: 0.7 mg/dL (ref 0.0–1.2)
Total Protein: 6.4 g/dL — ABNORMAL LOW (ref 6.5–8.1)

## 2024-05-24 LAB — CBC WITH DIFFERENTIAL/PLATELET
Abs Immature Granulocytes: 1.67 K/uL — ABNORMAL HIGH (ref 0.00–0.07)
Basophils Absolute: 0.2 K/uL — ABNORMAL HIGH (ref 0.0–0.1)
Basophils Relative: 1 %
Eosinophils Absolute: 0 K/uL (ref 0.0–0.5)
Eosinophils Relative: 0 %
HCT: 28.5 % — ABNORMAL LOW (ref 39.0–52.0)
Hemoglobin: 8.9 g/dL — ABNORMAL LOW (ref 13.0–17.0)
Immature Granulocytes: 9 %
Lymphocytes Relative: 16 %
Lymphs Abs: 3 K/uL (ref 0.7–4.0)
MCH: 29.7 pg (ref 26.0–34.0)
MCHC: 31.2 g/dL (ref 30.0–36.0)
MCV: 95 fL (ref 80.0–100.0)
Monocytes Absolute: 2.9 K/uL — ABNORMAL HIGH (ref 0.1–1.0)
Monocytes Relative: 16 %
Neutro Abs: 10.8 K/uL — ABNORMAL HIGH (ref 1.7–7.7)
Neutrophils Relative %: 58 %
Platelets: 261 K/uL (ref 150–400)
RBC: 3 MIL/uL — ABNORMAL LOW (ref 4.22–5.81)
RDW: 16.9 % — ABNORMAL HIGH (ref 11.5–15.5)
Smear Review: NORMAL
WBC: 18.5 K/uL — ABNORMAL HIGH (ref 4.0–10.5)
nRBC: 2.4 % — ABNORMAL HIGH (ref 0.0–0.2)

## 2024-05-24 NOTE — Discharge Instructions (Signed)
 Please follow-up with your doctor.  Return to the emergency department for any shortness of breath or any other symptom personally concerning to yourself.  Please follow-up for your planned dialysis session on Monday.

## 2024-05-24 NOTE — ED Notes (Signed)
 Pt discharged to ED circle at this time and left with all belongings. Pt ABCs intact. RR even and unlabored. Pt in NAD. Pt denies further needs from this RN.

## 2024-05-24 NOTE — ED Notes (Signed)
 ED RN took over care of pt after receiving handoff. Pt reporting to ED d/t AMS at dialysis appt with reports of being pale and unresponsive. Pt now back to baseline. Pt resting comfortably in stretcher, awaiting urine labs to result. Pt ABCs intact. RR even and unlabored. Pt in NAD. Bed in lowest locked position. Call bell in reach. Denies needs at this time.   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

## 2024-05-24 NOTE — ED Triage Notes (Signed)
 BIBEMS from dialysis being seen for his first treatment call for pt pale and unresponsive in waiting room. On arrival pt responsive to voice with initial SBP 80's. BG en route 139. SBP after 500 NS 124 and pt now Aox4 and without acute complaints stating he hasn't been able to sleep the last 2 nights. Hx fall 1 week ago discharged 12/17. VS WNL.   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

## 2024-05-24 NOTE — Patient Instructions (Signed)
 [name] - I am sorry I was unable to reach you today for our scheduled appointment. I work with Vicci Duwaine SQUIBB, DO and am calling to support your healthcare needs. Please contact me at [phone] at your earliest convenience. I look forward to speaking with you soon.   Thank you,  Lanore Renderos  Administrator, [employer] [phone]

## 2024-05-24 NOTE — ED Notes (Signed)
 Pt ABCs intact. RR even and unlabored. Pt in NAD. Bed in lowest locked position. Call bell in reach. Denies needs at this time.

## 2024-05-24 NOTE — ED Provider Notes (Signed)
 "  [facility] Provider Note    Event Date/Time   First MD Initiated Contact with Patient 05/24/24 1506     (approximate)  History   Chief Complaint: Altered Mental Status  HPI  [name] is a 83 y.o. male with a past medical history of gastric reflux, hypertension, hyperlipidemia, prior CVA, CKD, presents to the emergency department for near syncope weakness and hypotension.  According to record review and discharge summary from 05/22/2024 patient was recently admitted to the hospital for acute renal failure hyperkalemia started on dialysis.  Patient underwent 3 sessions of dialysis and was discharged 2 days ago.  Patient was at dialysis today for his first outpatient dialysis center when he began feeling very weak and lightheaded.  Symptoms started before dialysis was initiated or even accessed.  Patient has a right subclavian catheter.  Physical Exam   Triage Vital Signs: ED Triage Vitals  Encounter Vitals Group     BP 05/24/24 1328 (!) 142/84     Girls Systolic BP Percentile --      Girls Diastolic BP Percentile --      Boys Systolic BP Percentile --      Boys Diastolic BP Percentile --      Pulse Rate 05/24/24 1328 63     Resp 05/24/24 1328 16     Temp 05/24/24 1328 (!) 97.4 F (36.3 C)     Temp Source 05/24/24 1328 Oral     SpO2 05/24/24 1328 100 %     Weight 05/24/24 1329 166 lb (75.3 kg)     Height 05/24/24 1329 5' 11 (1.803 m)     Head Circumference --      Peak Flow --      Pain Score 05/24/24 1329 0     Pain Loc --      Pain Education --      Exclude from Growth Chart --     Most recent vital signs: Vitals:   05/24/24 1332 05/24/24 1428  BP:    Pulse:    Resp:    Temp:    SpO2: 100% 100%    General: Awake, no distress.  CV:  Good peripheral perfusion.  Regular rate and rhythm  Resp:  Normal effort.  Equal breath sounds bilaterally.  Abd:  No distention.  Soft, nontender.  No rebound or guarding.  ED Results / Procedures /  Treatments   MEDICATIONS ORDERED IN ED: Medications - No data to display   IMPRESSION / MDM / ASSESSMENT AND PLAN / ED COURSE  I reviewed the triage vital signs and the nursing notes.  Patient's presentation is most consistent with acute presentation with potential threat to life or bodily function.  Patient presents to the emergency department for near syncope weakness found to be hypotensive by EMS.  Patient received 500 cc of IV fluids and route to the hospital here the patient is normal to hypertensive most recent blood pressure 142/84.  Patient states he is feeling much better.  Patient states he has not been sleeping well for the last couple nights and has been feeling chills at times.  No fever reported at home afebrile here.  Vital signs otherwise reassuring.  We will check labs we will continue to closely monitor.  Patient does create urine each day we will check a urinalysis as well as a viral swab.  Patient's workup today shows significant leukocytosis of 18,000 chronic anemia.  Chemistry shows no significant findings besides anion gap of  16.  Patient receiving IV fluids.  Troponin is elevated at 98 however given his end-stage renal disease this is not overly concerning.  We will repeat after 2 hours.  Patient's respiratory panel is negative urinalysis is normal.  Patient's repeat troponin is largely unchanged.  Patient states he is feeling better and strongly wants to go home.  Vital signs have remained reassuring in the emergency department.  However given his significant leukocytosis this could have been a stress response.  We will repeat a CBC as the patient has received IV fluids and is feeling better.  Repeat CBC shows significant reduction in white blood cell count from 18,000-13.1.  As the patient feels well and wants to go home we will discharge home with outpatient follow-up.  Patient appears to be able to wait until Monday for his next dialysis session given reassuring  potassium and he continues to make urine.  FINAL CLINICAL IMPRESSION(S) / ED DIAGNOSES   Weakness   Note:  This document was prepared using Dragon voice recognition software and may include unintentional dictation errors.   Dorothyann Drivers, MD 05/24/24 2042  "

## 2024-05-24 NOTE — ED Notes (Signed)
 Pt provided with cup of water . Pt ABCs intact. RR even and unlabored. Pt in NAD. Bed in lowest locked position. Call bell in reach. Denies further needs at this time.

## 2024-05-27 ENCOUNTER — Other Ambulatory Visit: Payer: Self-pay

## 2024-05-29 ENCOUNTER — Ambulatory Visit: Admitting: Family Medicine

## 2024-05-29 ENCOUNTER — Inpatient Hospital Stay

## 2024-05-29 ENCOUNTER — Inpatient Hospital Stay (HOSPITAL_BASED_OUTPATIENT_CLINIC_OR_DEPARTMENT_OTHER): Admitting: Oncology

## 2024-05-29 ENCOUNTER — Inpatient Hospital Stay: Admitting: Oncology

## 2024-05-29 ENCOUNTER — Encounter: Payer: Self-pay | Admitting: Oncology

## 2024-05-29 VITALS — BP 129/78 | HR 78 | Temp 97.1°F | Resp 18 | Ht 70.0 in | Wt 157.3 lb

## 2024-05-29 DIAGNOSIS — Z79899 Other long term (current) drug therapy: Secondary | ICD-10-CM

## 2024-05-29 DIAGNOSIS — D469 Myelodysplastic syndrome, unspecified: Secondary | ICD-10-CM

## 2024-05-29 LAB — CBC WITH DIFFERENTIAL (CANCER CENTER ONLY)
Abs Immature Granulocytes: 1.62 K/uL — ABNORMAL HIGH (ref 0.00–0.07)
Basophils Absolute: 0.1 K/uL (ref 0.0–0.1)
Basophils Relative: 1 %
Eosinophils Absolute: 0 K/uL (ref 0.0–0.5)
Eosinophils Relative: 0 %
HCT: 22.5 % — ABNORMAL LOW (ref 39.0–52.0)
Hemoglobin: 7.1 g/dL — ABNORMAL LOW (ref 13.0–17.0)
Immature Granulocytes: 14 %
Lymphocytes Relative: 16 %
Lymphs Abs: 1.8 K/uL (ref 0.7–4.0)
MCH: 29.7 pg (ref 26.0–34.0)
MCHC: 31.6 g/dL (ref 30.0–36.0)
MCV: 94.1 fL (ref 80.0–100.0)
Monocytes Absolute: 1.6 K/uL — ABNORMAL HIGH (ref 0.1–1.0)
Monocytes Relative: 14 %
Neutro Abs: 6.4 K/uL (ref 1.7–7.7)
Neutrophils Relative %: 55 %
Platelet Count: 271 K/uL (ref 150–400)
RBC: 2.39 MIL/uL — ABNORMAL LOW (ref 4.22–5.81)
RDW: 17.4 % — ABNORMAL HIGH (ref 11.5–15.5)
WBC Count: 11.6 K/uL — ABNORMAL HIGH (ref 4.0–10.5)
nRBC: 2.2 % — ABNORMAL HIGH (ref 0.0–0.2)

## 2024-05-29 LAB — FERRITIN: Ferritin: 2611 ng/mL — ABNORMAL HIGH (ref 24–336)

## 2024-05-29 LAB — IRON AND TIBC
Iron: 52 ug/dL (ref 45–182)
Saturation Ratios: 30 % (ref 17.9–39.5)
TIBC: 171 ug/dL — ABNORMAL LOW (ref 250–450)
UIBC: 119 ug/dL

## 2024-05-29 MED ORDER — LUSPATERCEPT-AAMT 75 MG ~~LOC~~ SOLR
125.0000 mg | Freq: Once | SUBCUTANEOUS | Status: AC
  Administered 2024-05-29: 125 mg via SUBCUTANEOUS
  Filled 2024-05-29: qty 1.5

## 2024-05-29 NOTE — Progress Notes (Signed)
 Patient has no new or acute concerns at this time.

## 2024-06-02 ENCOUNTER — Encounter: Payer: Self-pay | Admitting: Oncology

## 2024-06-02 NOTE — Progress Notes (Signed)
 "    Hematology/Oncology Consult note [facility]  [phone] [phone]  Patient Care Team: Vicci Duwaine SQUIBB, DO as PCP - General (Family Medicine) Perla Evalene PARAS, MD as PCP - Cardiology (Cardiology) Etha Quinten Ludwig, MD as Referring Physician (Hematology) Alvia Norleen BIRCH, MD as Consulting Physician (Ophthalmology) Rosemarie Eather RAMAN, MD as Consulting Physician (Neurology) Nieves Cough, MD as Consulting Physician (Urology) Perla Evalene PARAS, MD as Consulting Physician (Cardiology) Twylla Glendia BROCKS, MD (Urology) Melanee Annah BROCKS, MD as Consulting Physician (Oncology) Willma Camelia CROME, RN (Inactive) as [facility] Care Management Mertel, [place] , RN as [facility] Care Management   Name of the patient: [name]  [id number]  [birth year]   Date of visit: 06/02/2024  Diagnosis- MDS/ MPN  Chief complaint/ Reason for visit- routine f/u of MDS/ MPN on luspatercept   Heme/Onc history: Patient is a 83 year old male diagnosed with primary myelofibrosis back in 2016.  At that time he was found to have a mild splenomegaly of 15.8 cm.DIPPS score is 64 (age 75- 1, hemoglobin less than 10- 2) and score of 4 if 1% circulating blasts included from 07/17/2014.   Bone marrow on 06/11/2014 was most consistent with primary myelofibrosis.  Bone marrow biopsy showed 1% abnormal cells: CD45+, CD5+, CD10, CD11c+/-, CD19+, CD2-+, (dim), CD22+ (dim, CD23+, CD38-/+, FMC7-, HLA-DR+, sig lambda+(dim).  Blasts were not increased 1.2%; hypercellular for age: 2%; JAK2 V617F mutation was negative.  CALR mutation positive.  Flow cytometry included about 1% CLL/SLL phenotype cells (CD5+) of uncertain significance and some infiltrate into the marrow with increased atypical megakaryocytes.  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  MDS FISH panel was negative.   Patient used to follow up with [facility] benign hematology Dr. Margart Ruddy for his anemia.  Patient's  hemoglobin was drifting down to the eights and was started on EPO in October 2022.   Patient has had worsening anemia requiring intermittent blood transfusions despite receiving Retacrit  since October 2024.  PatientUnderwent a repeat bone marrow biopsy in February 2025.  Findings were consistent with myelofibrosis with evidence of possible dysplasia and increased ringed sideroblasts and questionable myeloid dysgranulopoiesis.  Cytogenetic studies could not be completed due to no metaphase cells available for analysis.  NeoGenomics profile was also not completed due to limited specimen and poor quality DNA in the specimen.  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  History of Present Illness   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  He began hemodialysis via catheter approximately one and a half weeks ago due to worsening renal function. Dialysis is scheduled three times weekly, but logistical issues have limited frequency. It is unclear whether erythropoietin  is being administered by nephrology in conjunction with dialysis.  He continues to receive Luspatercept  injections.      ECOG PS- 3 Pain scale- 0 Opioid associated constipation- no  Review of systems- Review of Systems  Constitutional:  Positive for malaise/fatigue. Negative for chills, fever and weight loss.  HENT:  Negative for congestion, ear discharge and nosebleeds.   Eyes:  Negative for blurred vision.  Respiratory:  Negative for cough, hemoptysis,  sputum production, shortness of breath and wheezing.   Cardiovascular:  Negative for chest pain, palpitations, orthopnea and claudication.  Gastrointestinal:  Negative for abdominal pain, blood in stool, constipation, diarrhea, heartburn, melena, nausea and vomiting.  Genitourinary:  Negative for dysuria, flank pain, frequency, hematuria and urgency.  Musculoskeletal:  Negative for back pain, joint pain and myalgias.  Skin:  Negative for rash.  Neurological:  Negative for dizziness, tingling, focal weakness, seizures, weakness and headaches.  Endo/Heme/Allergies:  Does not bruise/bleed easily.  Psychiatric/Behavioral:  Negative for depression and suicidal ideas. The patient does not have insomnia.       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   right forearm, [facility]   Stroke (HCC) 11/2007   R brain subcortical infarct     Past Surgical History:  Procedure Laterality Date   ASPIRATION / INJECTION RENAL CYST  07/08/2015   BACK SURGERY     approx 20- 25 years ago   COLONOSCOPY     DIALYSIS/PERMA CATHETER INSERTION Right 05/20/2024   Procedure: DIALYSIS/PERMA CATHETER INSERTION;  Surgeon: Marea Selinda RAMAN, MD;  Location: [facility] INVASIVE CV LAB;  Service: Cardiovascular;  Laterality: Right;   EYE SURGERY     cataract both eyes   GAS INSERTION  08/11/2011   Procedure: INSERTION OF GAS;  Surgeon: Norleen JONETTA Ku, MD;  Location: [facility] OR;  Service: Ophthalmology;   Laterality: Right;  C3F8   IR BONE MARROW BIOPSY & ASPIRATION  08/03/2023   IR FLUORO GUIDE CV LINE RIGHT  10/23/2023   IR REMOVAL TUN CV CATH W/O FL  11/07/2023   IR THORACENTESIS ASP PLEURAL SPACE W/IMG GUIDE  09/22/2023   IR THORACENTESIS ASP PLEURAL SPACE W/IMG GUIDE  10/02/2023   LEFT HEART CATH AND CORONARY ANGIOGRAPHY N/A 05/02/2023   Procedure: LEFT HEART CATH AND CORONARY ANGIOGRAPHY;  Surgeon: Mady Bruckner, MD;  Location: [facility] INVASIVE CV LAB;  Service: Cardiovascular;  Laterality: N/A;   REVERSE SHOULDER ARTHROPLASTY Right 04/13/2023   Procedure: REVERSE SHOULDER ARTHROPLASTY;  Surgeon: Edie Norleen PARAS, MD;  Location: [facility] ORS;  Service: Orthopedics;  Laterality: Right;   SCLERAL BUCKLE  08/11/2011   Procedure: SCLERAL BUCKLE;  Surgeon: Norleen JONETTA Ku, MD;  Location: [facility] OR;  Service: Ophthalmology;  Laterality: Right;   VARICOSE VEIN SURGERY      Social History   Socioeconomic History   Marital status: Married    Spouse name: Maceo    Number of children: 2   Years of education: 12+   Highest education level: Some college, no degree  Occupational History   Occupation: Fish Farm Manager: OTHER    Comment: community   Occupation: SELF EMPLOYED    Employer: SELF EMPLOYED  Tobacco Use   Smoking status: Former    Current packs/day: 0.00    Average packs/day: 0.3 packs/day  Types: Cigarettes    Quit date: 06/06/2010    Years since quitting: 14.0   Smokeless tobacco: Never  Vaping Use   Vaping status: Never Used  Substance and Sexual Activity   Alcohol use: No    Alcohol/week: 0.0 standard drinks of alcohol   Drug use: No   Sexual activity: Not Currently  Other Topics Concern   Not on file  Social History Narrative   Pt lives at home with his family.   Caffeine Use- 2 cups daily   Patient has 2 children.    Patient has some college.    Patient is right handed.          Works full time   Social Drivers of Health   Tobacco Use: Medium Risk (05/29/2024)    Patient History    Smoking Tobacco Use: Former    Smokeless Tobacco Use: Never    Passive Exposure: Not on file  Financial Resource Strain: Low Risk  (05/14/2024)   Received from [facility]   Overall Financial Resource Strain (CARDIA)    Difficulty of Paying Living Expenses: Not hard at all  Food Insecurity: No Food Insecurity (05/17/2024)   Epic    Worried About Radiation Protection Practitioner of Food in the Last Year: Never true    Ran Out of Food in the Last Year: Never true  Transportation Needs: No Transportation Needs (05/17/2024)   Epic    Lack of Transportation (Medical): No    Lack of Transportation (Non-Medical): No  Physical Activity: Inactive (07/12/2021)   Exercise Vital Sign    Days of Exercise per Week: 0 days    Minutes of Exercise per Session: 0 min  Stress: No Stress Concern Present (07/12/2021)   [employer] of Occupational Health - Occupational Stress Questionnaire    Feeling of Stress : Not at all  Social Connections: Socially Integrated (05/17/2024)   Social Connection and Isolation Panel    Frequency of Communication with Friends and Family: More than three times a week    Frequency of Social Gatherings with Friends and Family: More than three times a week    Attends Religious Services: More than 4 times per year    Active Member of [employer] or Organizations: Yes    Attends Banker Meetings: More than 4 times per year    Marital Status: Married  Catering Manager Violence: Not At Risk (05/17/2024)   Epic    Fear of Current or Ex-Partner: No    Emotionally Abused: No    Physically Abused: No    Sexually Abused: No  Depression (PHQ2-9): Low Risk (05/29/2024)   Depression (PHQ2-9)    PHQ-2 Score: 0  Alcohol Screen: Low Risk (07/12/2021)   Alcohol Screen    Last Alcohol Screening Score (AUDIT): 0  Housing: Low Risk (05/17/2024)   Epic    Unable to Pay for Housing in the Last Year: No    Number of Times Moved in the Last Year: 0    Homeless in  the Last Year: No  Utilities: Not At Risk (05/17/2024)   Epic    Threatened with loss of utilities: No  Health Literacy: Medium Risk (06/09/2023)   Received from [facility]    How often do you need to have someone help you when you read instructions, pamphlets, or other written material from your doctor or pharmacy?: Rarely    Family History  Problem Relation Age of Onset   Heart disease Father  Dementia Sister    Diabetes Son    Kidney disease Neg Hx    Prostate cancer Neg Hx     Current Medications[2]  Physical exam:  Vitals:   05/29/24 1047  BP: 129/78  Pulse: 78  Resp: 18  Temp: (!) 97.1 F (36.2 C)  TempSrc: Tympanic  SpO2: 100%  Weight: 157 lb 4.8 oz (71.4 kg)  Height: 5' 10 (1.778 m)   Physical Exam Cardiovascular:     Rate and Rhythm: Normal rate and regular rhythm.     Heart sounds: Normal heart sounds.  Pulmonary:     Effort: Pulmonary effort is normal.     Breath sounds: Normal breath sounds.  Skin:    General: Skin is warm and dry.  Neurological:     Mental Status: He is alert and oriented to person, place, and time.      I have personally reviewed labs listed below:    Latest Ref Rng & Units 05/24/2024    1:31 PM  CMP  Glucose 70 - 99 mg/dL 893   BUN 8 - 23 mg/dL 35   Creatinine 9.38 - 1.24 mg/dL 5.76   Sodium 864 - 854 mmol/L 136   Potassium 3.5 - 5.1 mmol/L 4.8   Chloride 98 - 111 mmol/L 97   CO2 22 - 32 mmol/L 23   Calcium  8.9 - 10.3 mg/dL 8.0   Total Protein 6.5 - 8.1 g/dL 6.4   Total Bilirubin 0.0 - 1.2 mg/dL 0.7   Alkaline Phos 38 - 126 U/L 123   AST 15 - 41 U/L 36   ALT 0 - 44 U/L 17       Latest Ref Rng & Units 05/29/2024   10:09 AM  CBC  WBC 4.0 - 10.5 K/uL 11.6   Hemoglobin 13.0 - 17.0 g/dL 7.1   Hematocrit 60.9 - 52.0 % 22.5   Platelets 150 - 400 K/uL 271    I have personally reviewed Radiology images listed below: No images are attached to the encounter.  DG Chest 1 View Result Date:  05/20/2024 EXAM: 1 VIEW(S) XRAY OF THE CHEST 05/20/2024 01:37:10 PM COMPARISON: 10/24/2023 CLINICAL HISTORY: ESRD (end stage renal disease) (HCC) FINDINGS: LINES, TUBES AND DEVICES: Right chest wall dialysis catheter in place with tip overlying the expected region of the superior cavoatrial junction. LUNGS AND PLEURA: Chronic Right base airspace opacity. Chronic Small right pleural effusion. No pneumothorax. HEART AND MEDIASTINUM: Cardiomegaly. Atherosclerotic plaque. BONES AND SOFT TISSUES: Right shoulder arthroplasty noted. IMPRESSION: 1. Right chest wall dialysis catheter in expected position with tip at the superior cavoatrial junction. 2. Chronic right basilar airspace opacity with small right pleural effusion. Electronically signed by: Oneil Devonshire MD 05/20/2024 07:38 PM EST RP Workstation: [id number]   PERIPHERAL VASCULAR CATHETERIZATION Result Date: 05/20/2024 See surgical note for result.  DG Forearm Left Result Date: 05/17/2024 CLINICAL DATA:  Fall, laceration EXAM: DG FOREARM 2V*L* COMPARISON:  None Available. FINDINGS: No definitive fracture or malalignment. Moderate degenerative change at the radiocarpal joint. Prominent cysts in the carpal bones. Soft tissue swelling at the wrist. IMPRESSION: No acute osseous abnormality. Electronically Signed   By: Luke Bun M.D.   On: 05/17/2024 18:49   CT Head Wo Contrast Result Date: 05/17/2024 CLINICAL DATA:  Head trauma EXAM: CT HEAD WITHOUT CONTRAST CT CERVICAL SPINE WITHOUT CONTRAST TECHNIQUE: Multidetector CT imaging of the head and cervical spine was performed following the standard protocol without intravenous contrast. Multiplanar CT image reconstructions of the cervical spine were also  generated. RADIATION DOSE REDUCTION: This exam was performed according to the departmental dose-optimization program which includes automated exposure control, adjustment of the mA and/or kV according to patient size and/or use of iterative reconstruction  technique. COMPARISON:  CT brain 05/09/2024 FINDINGS: CT HEAD FINDINGS Brain: No acute territorial infarction, hemorrhage or intracranial mass. Atrophy and mild chronic small vessel ischemic changes of the white matter. Stable ventricle size Vascular: No hyperdense vessels.  Carotid vascular calcification Skull: Normal. Negative for fracture or focal lesion. Sinuses/Orbits: No acute finding. Other: None CT CERVICAL SPINE FINDINGS Alignment: Mild reversal of cervical lordosis. 4 mm anterolisthesis C5 on C6, stable compared with prior CT from April. Facet alignment is within normal limits. Skull base and vertebrae: Vertebral body heights are grossly maintained. Again visualized is nondisplaced fracture involving the right posterior arch of C1, with interval sclerotic margin, series 2, image 26, this is chronic compared to the CT from April. No new fracture is seen. Soft tissues and spinal canal: No prevertebral fluid or swelling. No visible canal hematoma. Disc levels: Partial interbody ankylosis C2-C3 and C4-C5 with facet ankylosis at these levels. Diffuse advanced disc space narrowing C2 through T1. Heterogeneous vertebral sclerosis also without change. Hypertrophic facet degenerative changes at multiple levels with multilevel moderate severe foraminal stenosis. Upper chest: Negative. Other: None IMPRESSION: 1. No CT evidence for acute intracranial abnormality. Atrophy and chronic small vessel ischemic changes of the white matter. 2. Mild reversal of cervical lordosis with chronic 4 mm anterolisthesis C5 on C6. No acute osseous abnormality. Advanced multilevel degenerative changes. 3. Chronic nondisplaced fracture involving the right posterior arch of C1. Electronically Signed   By: Luke Bun M.D.   On: 05/17/2024 18:48   CT Cervical Spine Wo Contrast Result Date: 05/17/2024 CLINICAL DATA:  Head trauma EXAM: CT HEAD WITHOUT CONTRAST CT CERVICAL SPINE WITHOUT CONTRAST TECHNIQUE: Multidetector CT imaging of  the head and cervical spine was performed following the standard protocol without intravenous contrast. Multiplanar CT image reconstructions of the cervical spine were also generated. RADIATION DOSE REDUCTION: This exam was performed according to the departmental dose-optimization program which includes automated exposure control, adjustment of the mA and/or kV according to patient size and/or use of iterative reconstruction technique. COMPARISON:  CT brain 05/09/2024 FINDINGS: CT HEAD FINDINGS Brain: No acute territorial infarction, hemorrhage or intracranial mass. Atrophy and mild chronic small vessel ischemic changes of the white matter. Stable ventricle size Vascular: No hyperdense vessels.  Carotid vascular calcification Skull: Normal. Negative for fracture or focal lesion. Sinuses/Orbits: No acute finding. Other: None CT CERVICAL SPINE FINDINGS Alignment: Mild reversal of cervical lordosis. 4 mm anterolisthesis C5 on C6, stable compared with prior CT from April. Facet alignment is within normal limits. Skull base and vertebrae: Vertebral body heights are grossly maintained. Again visualized is nondisplaced fracture involving the right posterior arch of C1, with interval sclerotic margin, series 2, image 26, this is chronic compared to the CT from April. No new fracture is seen. Soft tissues and spinal canal: No prevertebral fluid or swelling. No visible canal hematoma. Disc levels: Partial interbody ankylosis C2-C3 and C4-C5 with facet ankylosis at these levels. Diffuse advanced disc space narrowing C2 through T1. Heterogeneous vertebral sclerosis also without change. Hypertrophic facet degenerative changes at multiple levels with multilevel moderate severe foraminal stenosis. Upper chest: Negative. Other: None IMPRESSION: 1. No CT evidence for acute intracranial abnormality. Atrophy and chronic small vessel ischemic changes of the white matter. 2. Mild reversal of cervical lordosis with chronic 4 mm  anterolisthesis C5 on C6. No acute osseous abnormality. Advanced multilevel degenerative changes. 3. Chronic nondisplaced fracture involving the right posterior arch of C1. Electronically Signed   By: Luke Bun M.D.   On: 05/17/2024 18:48   CT HEAD WO CONTRAST Result Date: 05/09/2024 EXAM: CT HEAD WITHOUT CONTRAST 05/09/2024 03:46:04 PM TECHNIQUE: CT of the head was performed without the administration of intravenous contrast. Automated exposure control, iterative reconstruction, and/or weight based adjustment of the mA/kV was utilized to reduce the radiation dose to as low as reasonably achievable. COMPARISON: CT of the head dated 06/26/2023. CLINICAL HISTORY: htn dizzy FINDINGS: BRAIN AND VENTRICLES: No acute hemorrhage. No evidence of acute infarct. No hydrocephalus. No extra-axial collection. No mass effect or midline shift. There is age-related volume loss and mild periventricular white matter disease. There is moderate calcification within the carotid siphons. ORBITS: The patient is status post bilateral lens replacement and right-sided scleral banding. SINUSES: No acute abnormality. SOFT TISSUES AND SKULL: No acute soft tissue abnormality. No skull fracture. IMPRESSION: 1. No acute intracranial abnormality. 2. Age-related volume loss and mild periventricular white matter disease. 3. Moderate calcification within the carotid siphons. Electronically signed by: Evalene Coho MD 05/09/2024 03:57 PM EST RP Workstation: [id number]     Assessment and plan- Patient is a 83 y.o. male with h/o MDS/ MPN presently on luspatercept  here for routine f/u  Assessment and Plan    Myelofibrosis and myelodysplastic syndrome Confirmed by bone marrow biopsy with SF3B1 mutation. Persistent anemia on maximum dose luspatercept  suggests suboptimal response and possible disease progression. Further evaluation needed to determine predominant disease component. - Continued luspatercept  injections at maximum dose every  three weeks. - Ordered repeat bone marrow biopsy within the next month to assess disease status and determine if myelofibrosis or myelodysplasia is predominant. - Will evaluate bone marrow results to consider JAK inhibitors as additonal treatment options - Scheduled follow-up in approximately two months or sooner if bone marrow results are available.  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 Transfusion-dependent with hemoglobin 7.1 g/dL, refractory to EPO and suboptimal response to luspatercept . - Continued luspatercept  injections. - Coordinated with nephrology to confirm EPO administration with dialysis. - Arranged for possible blood transfusion next week, pending availability. - Monitored hemoglobin and transfusion requirements.  End stage renal disease on hemodialysis Recently initiated hemodialysis via catheter. Scheduling difficulties limit frequency. Renal failure contributes to anemia and overall clinical status. - Coordinated with nephrology (Dr. Dennise) to clarify EPO administration with dialysis.  Splenomegaly Ongoing assessment necessary as progression may impact cytopenias and disease management. - Ordered abdominal ultrasound to assess spleen size and morphology.         Visit Diagnosis 1. Myelodysplasia (myelodysplastic syndrome) (HCC)   2. High risk medication use      Dr. Annah Skene, MD, MPH [facility] [phone] 06/02/2024 10:47 AM                   [1]  Allergies Allergen Reactions   Quinolones Other (See Comments)    Patient with aortic aneurysm. Use of quinolones are considered to be contraindicated due to risk of aortic rupture.   Meloxicam Nausea And Vomiting  [2]  Current Outpatient Medications:    acetaminophen  (TYLENOL ) 500 MG tablet, Take 1,000 mg by mouth every 6 (six) hours as needed for mild pain  (pain score 1-3)., Disp: , Rfl:    amiodarone  (PACERONE ) 200 MG  tablet, Take 1 tablet (200 mg total) by mouth daily., Disp: 30 tablet, Rfl: 0   amLODipine  (NORVASC ) 10 MG tablet, Take 1 tablet (10 mg total) by mouth daily., Disp: 30 tablet, Rfl: 0   cyanocobalamin  1000 MCG tablet, Take 1 tablet (1,000 mcg total) by mouth daily., Disp: 30 tablet, Rfl: 0   DULoxetine  (CYMBALTA ) 20 MG capsule, Take 1 capsule (20 mg total) by mouth at bedtime., Disp: 90 capsule, Rfl: 1   finasteride  (PROSCAR ) 5 MG tablet, Take 5 mg by mouth daily., Disp: , Rfl:    lactulose  (CHRONULAC ) 10 GM/15ML solution, Take 30 mLs (20 g total) by mouth 2 (two) times daily as needed for mild constipation., Disp: 236 mL, Rfl: 0   levothyroxine  (SYNTHROID ) 150 MCG tablet, Take 1 tablet (150 mcg total) by mouth daily before breakfast., Disp: 90 tablet, Rfl: 0   metoprolol  succinate (TOPROL -XL) 25 MG 24 hr tablet, Take 1 tablet (25 mg total) by mouth 2 (two) times daily., Disp: 180 tablet, Rfl: 1   Multiple Vitamin (MULITIVITAMIN WITH MINERALS) TABS, Take 1 tablet by mouth in the morning., Disp: , Rfl:    ondansetron  (ZOFRAN ) 4 MG tablet, Take 1 tablet (4 mg total) by mouth every 6 (six) hours as needed for nausea., Disp: 20 tablet, Rfl: 0   pantoprazole  (PROTONIX ) 40 MG tablet, Take 1 tablet (40 mg total) by mouth daily., Disp: 90 tablet, Rfl: 1   simvastatin  (ZOCOR ) 40 MG tablet, Take 1 tablet (40 mg total) by mouth daily., Disp: 90 tablet, Rfl: 1   sodium bicarbonate  650 MG tablet, Take 1 tablet (650 mg total) by mouth 3 (three) times daily., Disp: , Rfl:    tamsulosin  (FLOMAX ) 0.4 MG CAPS capsule, Take 1 capsule (0.4 mg total) by mouth daily., Disp: 90 capsule, Rfl: 1   traMADol  (ULTRAM ) 50 MG tablet, Take 1 tablet (50 mg total) by mouth every 12 (twelve) hours as needed for moderate pain (pain score 4-6)., Disp: 30 tablet, Rfl: 0   triamcinolone  ointment (KENALOG ) 0.5 %, Apply 1 Application topically 2 (two) times daily., Disp: 30 g,  Rfl: 0 No current facility-administered medications for this visit.  Facility-Administered Medications Ordered in Other Visits:    epoetin  alfa-epbx (RETACRIT ) injection 10,000 Units, 10,000 Units, Subcutaneous, Once, Melanee Annah BROCKS, MD   epoetin  alfa-epbx (RETACRIT ) injection 40,000 Units, 40,000 Units, Subcutaneous, Once, Melanee Annah BROCKS, MD  "

## 2024-06-03 ENCOUNTER — Telehealth: Payer: Self-pay

## 2024-06-03 NOTE — Telephone Encounter (Signed)
 Per Dr. Melanee please check with nephrology if he is on EPO with dialysis.  Outbound call; spoke with Whitney who confirmed patient has not started dialysis as of yet, scheduled to start soon.  Also indicated they put in a referral for vascular access but nothing has been completed at this time.

## 2024-06-04 NOTE — Telephone Encounter (Signed)
 Outbound call to patient to offer date for bone marrow biopsy.  Per Clarita Kitchens 1/12 at 8:30a an arrive at 7:30a or Tues 1/13 at the same time  Outbound call; spouse indicated he gets dialysis on Mondays but agreed to bm bx on Tues 06/18/24 arrive at 8:30a for a 9:30a. Reviewed NPO after midnight, will need a driver due to moderate sedation being involved and that [facility].

## 2024-06-05 ENCOUNTER — Inpatient Hospital Stay

## 2024-06-05 ENCOUNTER — Other Ambulatory Visit: Payer: Self-pay

## 2024-06-05 ENCOUNTER — Ambulatory Visit

## 2024-06-05 ENCOUNTER — Ambulatory Visit: Admitting: Oncology

## 2024-06-05 DIAGNOSIS — D469 Myelodysplastic syndrome, unspecified: Secondary | ICD-10-CM | POA: Diagnosis not present

## 2024-06-05 DIAGNOSIS — D631 Anemia in chronic kidney disease: Secondary | ICD-10-CM

## 2024-06-05 LAB — CBC WITH DIFFERENTIAL (CANCER CENTER ONLY)
Abs Immature Granulocytes: 1.79 K/uL — ABNORMAL HIGH (ref 0.00–0.07)
Basophils Absolute: 0.1 K/uL (ref 0.0–0.1)
Basophils Relative: 1 %
Eosinophils Absolute: 0 K/uL (ref 0.0–0.5)
Eosinophils Relative: 0 %
HCT: 22.6 % — ABNORMAL LOW (ref 39.0–52.0)
Hemoglobin: 7.2 g/dL — ABNORMAL LOW (ref 13.0–17.0)
Immature Granulocytes: 9 %
Lymphocytes Relative: 13 %
Lymphs Abs: 2.6 K/uL (ref 0.7–4.0)
MCH: 29.3 pg (ref 26.0–34.0)
MCHC: 31.9 g/dL (ref 30.0–36.0)
MCV: 91.9 fL (ref 80.0–100.0)
Monocytes Absolute: 2.6 K/uL — ABNORMAL HIGH (ref 0.1–1.0)
Monocytes Relative: 13 %
Neutro Abs: 12.7 K/uL — ABNORMAL HIGH (ref 1.7–7.7)
Neutrophils Relative %: 64 %
Platelet Count: 345 K/uL (ref 150–400)
RBC: 2.46 MIL/uL — ABNORMAL LOW (ref 4.22–5.81)
RDW: 18.3 % — ABNORMAL HIGH (ref 11.5–15.5)
Smear Review: NORMAL
WBC Count: 19.8 K/uL — ABNORMAL HIGH (ref 4.0–10.5)
nRBC: 3 % — ABNORMAL HIGH (ref 0.0–0.2)

## 2024-06-05 LAB — CMP (CANCER CENTER ONLY)
ALT: 19 U/L (ref 0–44)
AST: 29 U/L (ref 15–41)
Albumin: 3.6 g/dL (ref 3.5–5.0)
Alkaline Phosphatase: 146 U/L — ABNORMAL HIGH (ref 38–126)
Anion gap: 12 (ref 5–15)
BUN: 29 mg/dL — ABNORMAL HIGH (ref 8–23)
CO2: 26 mmol/L (ref 22–32)
Calcium: 8.1 mg/dL — ABNORMAL LOW (ref 8.9–10.3)
Chloride: 101 mmol/L (ref 98–111)
Creatinine: 3.55 mg/dL — ABNORMAL HIGH (ref 0.61–1.24)
GFR, Estimated: 16 mL/min — ABNORMAL LOW
Glucose, Bld: 97 mg/dL (ref 70–99)
Potassium: 4.6 mmol/L (ref 3.5–5.1)
Sodium: 139 mmol/L (ref 135–145)
Total Bilirubin: 0.6 mg/dL (ref 0.0–1.2)
Total Protein: 6 g/dL — ABNORMAL LOW (ref 6.5–8.1)

## 2024-06-05 LAB — SAMPLE TO BLOOD BANK

## 2024-06-05 LAB — PREPARE RBC (CROSSMATCH)

## 2024-06-06 LAB — ERYTHROPOIETIN: Erythropoietin: 39.1 m[IU]/mL — ABNORMAL HIGH (ref 2.6–18.5)

## 2024-06-07 ENCOUNTER — Inpatient Hospital Stay: Attending: Oncology

## 2024-06-07 DIAGNOSIS — D469 Myelodysplastic syndrome, unspecified: Secondary | ICD-10-CM | POA: Insufficient documentation

## 2024-06-07 DIAGNOSIS — N183 Chronic kidney disease, stage 3 unspecified: Secondary | ICD-10-CM

## 2024-06-07 MED ORDER — ACETAMINOPHEN 325 MG PO TABS
650.0000 mg | ORAL_TABLET | Freq: Once | ORAL | Status: AC
  Administered 2024-06-07: 650 mg via ORAL
  Filled 2024-06-07: qty 2

## 2024-06-07 MED ORDER — DIPHENHYDRAMINE HCL 25 MG PO TABS
25.0000 mg | ORAL_TABLET | Freq: Once | ORAL | Status: AC
  Administered 2024-06-07: 25 mg via ORAL
  Filled 2024-06-07: qty 1

## 2024-06-08 LAB — TYPE AND SCREEN
ABO/RH(D): O POS
Antibody Screen: NEGATIVE
Unit division: 0

## 2024-06-08 LAB — BPAM RBC
Blood Product Expiration Date: 202601272359
ISSUE DATE / TIME: 202601021027
Unit Type and Rh: 5100

## 2024-06-11 ENCOUNTER — Telehealth: Payer: Self-pay

## 2024-06-11 ENCOUNTER — Other Ambulatory Visit: Payer: Self-pay

## 2024-06-11 NOTE — Patient Instructions (Addendum)
 [name] -  I work with Vicci Duwaine SQUIBB, DO and am calling to support your healthcare needs. If I can be of assistance to you, please contact me at [phone].   Per Caregiver request we will dis-enroll patient from the Complex Case Management program.  If we can be of assistance in the future please contact us .    Thank you,  Derryl Uher RN RN Care Manager [facility] [phone]  .     Thank you,  Mattea Seger  Administrator, [employer] [phone]

## 2024-06-12 ENCOUNTER — Inpatient Hospital Stay

## 2024-06-12 NOTE — Progress Notes (Unsigned)
" °  Cardiology Office Note   Date:  06/12/2024  ID:  [id number] SPECIALE, DOB [birth year], MRN [id number] PCP: Vicci Duwaine SQUIBB, DO  [facility] Providers Cardiologist:  Evalene Lunger, MD { Click to update primary MD,subspecialty MD or APP then REFRESH:1}    History of Present Illness EUSTACIO ELLEN is a 84 y.o. male with a past medical history of hypertension, hyperlipidemia, PAD, stroke, myelofibrosis with chronic anemia, chronic kidney disease stage III-IV, history of traumatic subarachnoid hemorrhage, malnutrition, paroxysmal atrial fibrillation, who is here today requesting preoperative cardiovascular examination.   Originally was seen as a consult during hospitalization 11/26 - 05/09/2023 where he had began feeling weak the day before and had abdominal chest pain radiating into the right shoulder.  Of note he had had right shoulder surgery approximately 2 weeks prior complicated by acute on chronic anemia requiring PRBC transfusion.  When the paramedics arrived they found him to be tachycardic with extensive ST segment elevation prompting the activation of code STEMI.  On arrival to the ER he continued to complain of chest pain and abdominal pain.  EKG again showed anterior lateral inferior ST segment elevation.  He was emergently taken to the Cath Lab.  Left heart catheterization showed mild, nonobstructive CAD.  Bedside echo showed mildly reduced LVEF with global hypokinesis confounded by atrial fibrillation with RVR with a small pericardial effusion being noted as well.  He was treated for acute pericarditis but options were limited to steroids and given his CKD as he was not a candidate for NSAIDs or colchicine .  For his atrial fibrillation RVR he was placed on amiodarone  and then drip.  Soft blood pressure limited any other rate controlling medications such as beta-blockers or calcium  channel blockers.  Heparin  was deferred in the setting of anemia and possible acute pericarditis initially.  He  was transfused 1 unit of PRBCs.  Renal function significantly improved.  Continue to remain medically stable.  Echocardiogram revealed an LVEF of 40 to 45% with mild pulmonary hypertension.  Repeat echocardiogram showed mild to moderate pericardial effusion.  He was continued on prednisone  at reduced dose of 10 mg daily.  He was initially started to oral apixaban  2.5 mg twice daily and started on metoprolol  for rate control.     ROS: ***  Studies Reviewed      *** Risk Assessment/Calculations {Does this patient have ATRIAL FIBRILLATION?:[id number]} No BP recorded.  {Refresh Note OR Click here to enter BP  :1}***       Physical Exam VS:  There were no vitals taken for this visit.       Wt Readings from Last 3 Encounters:  05/29/24 157 lb 4.8 oz (71.4 kg)  05/24/24 166 lb (75.3 kg)  05/22/24 159 lb 6.3 oz (72.3 kg)    GEN: Well nourished, well developed in no acute distress NECK: No JVD; No carotid bruits CARDIAC: ***RRR, no murmurs, rubs, gallops RESPIRATORY:  Clear to auscultation without rales, wheezing or rhonchi  ABDOMEN: Soft, non-tender, non-distended EXTREMITIES:  No edema; No deformity   ASSESSMENT AND PLAN ***    {Are you ordering a CV Procedure (e.g. stress test, cath, DCCV, TEE, etc)?   Press F2        :[id number]}  Dispo: ***  Signed, Jamil Castillo, NP   "

## 2024-06-13 ENCOUNTER — Ambulatory Visit: Attending: Cardiology | Admitting: Cardiology

## 2024-06-13 ENCOUNTER — Inpatient Hospital Stay

## 2024-06-17 ENCOUNTER — Telehealth: Payer: Self-pay

## 2024-06-17 NOTE — H&P (Signed)
 "    Chief Complaint: Patient was seen in consultation today for myelodysplastic syndrome evaluation, with consideration for repeat bone marrow biopsy and aspiration.  Referring Provider(s): Dr. Annah Skene, MD   Supervising Physician: Jenna Hacker  Patient Status: [facility] - Out-pt  Patient is Full Code  History of Present Illness: [name] is a 84 y.o. male  with PMHx notable for myelofibrosis (BMB 06/11/2014), HTN, HLD, hypothyroidism, CVA, GERD, nephrolithiasis, BPH, and others as delineated below.  Per Dr. 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 Persistent anemia on maximum dose luspatercept  suggests suboptimal response and possible disease progression. Further evaluation needed to determine predominant disease component. - Continued luspatercept  injections at maximum dose every three weeks. - Ordered repeat bone marrow biopsy within the next month to assess disease status and determine if myelofibrosis or myelodysplasia is predominant. - Will evaluate bone marrow results to consider JAK inhibitors as additonal treatment options - Scheduled follow-up in approximately two months or sooner if bone marrow results are available.    Interventional Radiology was requested for bone marrow biopsy and aspiration. Patient is scheduled for same in IR today.   Patient is alert and laying in bed, calm.  Patient is currently without any significant complaints. He feels well. Patient denies any fevers, headache, chest pain, SOB, cough, abdominal pain, nausea, vomiting or bleeding.     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   right forearm, [facility]   Stroke (HCC) 11/2007   R brain subcortical infarct    Past Surgical History:  Procedure Laterality Date   ASPIRATION / INJECTION RENAL CYST  07/08/2015   BACK SURGERY     approx 20- 25 years ago   COLONOSCOPY     DIALYSIS/PERMA CATHETER INSERTION Right 05/20/2024   Procedure: DIALYSIS/PERMA CATHETER INSERTION;  Surgeon: Marea Selinda RAMAN, MD;  Location: [facility] INVASIVE CV LAB;  Service: Cardiovascular;  Laterality: Right;   EYE SURGERY     cataract both eyes   GAS INSERTION  08/11/2011   Procedure: INSERTION OF GAS;  Surgeon: Norleen JONETTA Ku, MD;  Location: [facility] OR;  Service: Ophthalmology;  Laterality: Right;  C3F8   IR BONE MARROW BIOPSY & ASPIRATION  08/03/2023   IR FLUORO GUIDE CV LINE RIGHT  10/23/2023   IR REMOVAL TUN CV CATH W/O FL  11/07/2023   IR THORACENTESIS RIGHT ASP PLEURAL SPACE W/IMG GUIDE  09/22/2023   IR THORACENTESIS RIGHT ASP PLEURAL SPACE W/IMG GUIDE  10/02/2023   LEFT HEART CATH AND CORONARY ANGIOGRAPHY N/A 05/02/2023   Procedure: LEFT HEART CATH AND CORONARY ANGIOGRAPHY;  Surgeon: Mady Bruckner, MD;  Location: [facility] INVASIVE CV LAB;  Service: Cardiovascular;  Laterality: N/A;   REVERSE SHOULDER ARTHROPLASTY Right 04/13/2023   Procedure: REVERSE SHOULDER ARTHROPLASTY;  Surgeon: Edie Norleen PARAS, MD;  Location: [facility] ORS;  Service:  Orthopedics;  Laterality: Right;   SCLERAL BUCKLE  08/11/2011   Procedure: SCLERAL BUCKLE;  Surgeon: Norleen JONETTA Ku, MD;  Location: [facility] OR;  Service: Ophthalmology;  Laterality: Right;   VARICOSE VEIN SURGERY      Allergies: Quinolones and Meloxicam  Medications: Prior to Admission medications  Medication Sig Start Date End Date Taking? Authorizing Provider  acetaminophen   (TYLENOL ) 500 MG tablet Take 1,000 mg by mouth every 6 (six) hours as needed for mild pain (pain score 1-3).    [provider]  amiodarone  (PACERONE ) 200 MG tablet Take 1 tablet (200 mg total) by mouth daily. 10/09/23   Angiulli, Toribio PARAS, PA-C  amLODipine  (NORVASC ) 10 MG tablet Take 1 tablet (10 mg total) by mouth daily. 05/22/24 06/21/24  Dezii, Alexandra, DO  cyanocobalamin  1000 MCG tablet Take 1 tablet (1,000 mcg total) by mouth daily. 10/09/23   Angiulli, Toribio PARAS, PA-C  DULoxetine  (CYMBALTA ) 20 MG capsule Take 1 capsule (20 mg total) by mouth at bedtime. 04/14/24   Johnson, Megan P, DO  finasteride  (PROSCAR ) 5 MG tablet Take 5 mg by mouth daily. 04/29/24 04/29/25  [provider]  lactulose  (CHRONULAC ) 10 GM/15ML solution Take 30 mLs (20 g total) by mouth 2 (two) times daily as needed for mild constipation. 05/22/24   Dezii, Alexandra, DO  levothyroxine  (SYNTHROID ) 150 MCG tablet Take 1 tablet (150 mcg total) by mouth daily before breakfast. 04/14/24   Vicci Bouchard P, DO  metoprolol  succinate (TOPROL -XL) 25 MG 24 hr tablet Take 1 tablet (25 mg total) by mouth 2 (two) times daily. 04/14/24   Vicci Bouchard P, DO  Multiple Vitamin (MULITIVITAMIN WITH MINERALS) TABS Take 1 tablet by mouth in the morning.    [provider]  ondansetron  (ZOFRAN ) 4 MG tablet Take 1 tablet (4 mg total) by mouth every 6 (six) hours as needed for nausea. 10/23/23   Alexander, Natalie, DO  pantoprazole  (PROTONIX ) 40 MG tablet Take 1 tablet (40 mg total) by mouth daily. 04/14/24   Johnson, Megan P, DO  simvastatin  (ZOCOR ) 40 MG tablet Take 1 tablet (40 mg total) by mouth daily. 04/14/24   Johnson, Megan P, DO  sodium bicarbonate  650 MG tablet Take 1 tablet (650 mg total) by mouth 3 (three) times daily. 10/23/23   Alexander, Natalie, DO  tamsulosin  (FLOMAX ) 0.4 MG CAPS capsule Take 1 capsule (0.4 mg total) by mouth daily. 04/14/24   Johnson, Megan P, DO  traMADol  (ULTRAM ) 50 MG tablet Take 1 tablet (50 mg  total) by mouth every 12 (twelve) hours as needed for moderate pain (pain score 4-6). 10/09/23   Angiulli, Toribio PARAS, PA-C  triamcinolone  ointment (KENALOG ) 0.5 % Apply 1 Application topically 2 (two) times daily. 04/29/24   Vicci Bouchard SQUIBB, DO     Family History  Problem Relation Age of Onset   Heart disease Father    Dementia Sister    Diabetes Son    Kidney disease Neg Hx    Prostate cancer Neg Hx     Social History   Socioeconomic History   Marital status: Married    Spouse name: Maceo    Number of children: 2   Years of education: 12+   Highest education level: Some college, no degree  Occupational History   Occupation: Fish Farm Manager: OTHER    Comment: community   Occupation: SELF EMPLOYED    Employer: SELF EMPLOYED  Tobacco Use   Smoking status: Former    Current packs/day: 0.00  Average packs/day: 0.3 packs/day    Types: Cigarettes    Quit date: 06/06/2010    Years since quitting: 14.0   Smokeless tobacco: Never  Vaping Use   Vaping status: Never Used  Substance and Sexual Activity   Alcohol use: No    Alcohol/week: 0.0 standard drinks of alcohol   Drug use: No   Sexual activity: Not Currently  Other Topics Concern   Not on file  Social History Narrative   Pt lives at home with his family.   Caffeine Use- 2 cups daily   Patient has 2 children.    Patient has some college.    Patient is right handed.          Works full time   Social Drivers of Health   Tobacco Use: Medium Risk (05/29/2024)   Patient History    Smoking Tobacco Use: Former    Smokeless Tobacco Use: Never    Passive Exposure: Not on file  Financial Resource Strain: Low Risk  (05/14/2024)   Received from [facility]   Overall Financial Resource Strain (CARDIA)    Difficulty of Paying Living Expenses: Not hard at all  Food Insecurity: No Food Insecurity (05/17/2024)   Epic    Worried About Radiation Protection Practitioner of Food in the Last Year: Never true    Ran Out of Food in  the Last Year: Never true  Transportation Needs: No Transportation Needs (05/17/2024)   Epic    Lack of Transportation (Medical): No    Lack of Transportation (Non-Medical): No  Physical Activity: Inactive (07/12/2021)   Exercise Vital Sign    Days of Exercise per Week: 0 days    Minutes of Exercise per Session: 0 min  Stress: No Stress Concern Present (07/12/2021)   [employer] of Occupational Health - Occupational Stress Questionnaire    Feeling of Stress : Not at all  Social Connections: Socially Integrated (05/17/2024)   Social Connection and Isolation Panel    Frequency of Communication with Friends and Family: More than three times a week    Frequency of Social Gatherings with Friends and Family: More than three times a week    Attends Religious Services: More than 4 times per year    Active Member of Clubs or Organizations: Yes    Attends Banker Meetings: More than 4 times per year    Marital Status: Married  Depression (PHQ2-9): Low Risk (05/29/2024)   Depression (PHQ2-9)    PHQ-2 Score: 0  Alcohol Screen: Low Risk (07/12/2021)   Alcohol Screen    Last Alcohol Screening Score (AUDIT): 0  Housing: Low Risk (05/17/2024)   Epic    Unable to Pay for Housing in the Last Year: No    Number of Times Moved in the Last Year: 0    Homeless in the Last Year: No  Utilities: Not At Risk (05/17/2024)   Epic    Threatened with loss of utilities: No  Health Literacy: Medium Risk (06/09/2023)   Received from [facility]    How often do you need to have someone help you when you read instructions, pamphlets, or other written material from your doctor or pharmacy?: Rarely     Review of Systems: A 12 point ROS discussed and pertinent positives are indicated in the HPI above.  All other systems are negative.  Vital Signs: BP (!) 152/73   Pulse 63   Temp 97.7 F (36.5 C) (Oral)   Resp  13   Ht 5' 10 (1.778 m)   Wt 158 lb (71.7 kg)   SpO2 97%    BMI 22.67 kg/m   Advance Care Plan: The advanced care place/surrogate decision maker was discussed at the time of visit and the patient did not wish to discuss or was not able to name a surrogate decision maker or provide an advance care plan.  Physical Exam Vitals reviewed.  Constitutional:      Appearance: Normal appearance.  HENT:     Mouth/Throat:     Mouth: Mucous membranes are dry.  Cardiovascular:     Rate and Rhythm: Normal rate and regular rhythm.     Pulses: Normal pulses.     Heart sounds: Normal heart sounds.  Pulmonary:     Effort: Pulmonary effort is normal.     Breath sounds: Normal breath sounds.  Musculoskeletal:        General: Normal range of motion.     Cervical back: Normal range of motion.  Skin:    General: Skin is warm and dry.  Neurological:     Mental Status: He is alert and oriented to person, place, and time.  Psychiatric:        Mood and Affect: Mood normal.        Behavior: Behavior normal.        Thought Content: Thought content normal.        Judgment: Judgment normal.     Imaging: DG Chest 1 View Result Date: 05/20/2024 EXAM: 1 VIEW(S) XRAY OF THE CHEST 05/20/2024 01:37:10 PM COMPARISON: 10/24/2023 CLINICAL HISTORY: ESRD (end stage renal disease) (HCC) FINDINGS: LINES, TUBES AND DEVICES: Right chest wall dialysis catheter in place with tip overlying the expected region of the superior cavoatrial junction. LUNGS AND PLEURA: Chronic Right base airspace opacity. Chronic Small right pleural effusion. No pneumothorax. HEART AND MEDIASTINUM: Cardiomegaly. Atherosclerotic plaque. BONES AND SOFT TISSUES: Right shoulder arthroplasty noted. IMPRESSION: 1. Right chest wall dialysis catheter in expected position with tip at the superior cavoatrial junction. 2. Chronic right basilar airspace opacity with small right pleural effusion. Electronically signed by: Oneil Devonshire MD 05/20/2024 07:38 PM EST RP Workstation: [id number]   PERIPHERAL VASCULAR  CATHETERIZATION Result Date: 05/20/2024 See surgical note for result.   Labs:  CBC: Recent Labs    05/24/24 1331 05/24/24 2023 05/29/24 1009 06/05/24 1055  WBC 18.5* 13.1* 11.6* 19.8*  HGB 8.9* 7.7* 7.1* 7.2*  HCT 28.5* 24.5* 22.5* 22.6*  PLT 261 203 271 345    COAGS: Recent Labs    09/21/23 1432 05/09/24 1457  INR 1.4* 1.1  APTT  --  55*    BMP: Recent Labs    05/21/24 0657 05/22/24 0900 05/24/24 1331 06/05/24 1055  NA 138 139 136 139  K 5.0 4.2 4.8 4.6  CL 101 100 97* 101  CO2 26 30 23 26   GLUCOSE 124* 85 106* 97  BUN 60* 37* 35* 29*  CALCIUM  8.7* 8.4* 8.0* 8.1*  CREATININE 4.65* 3.68* 4.23* 3.55*  GFRNONAA 12* 16* 13* 16*    LIVER FUNCTION TESTS: Recent Labs    05/09/24 1457 05/17/24 1538 05/19/24 0522 05/20/24 1356 05/22/24 0900 05/24/24 1331 06/05/24 1055  BILITOT 0.7 0.5  --   --   --  0.7 0.6  AST 22 25  --   --   --  36 29  ALT 21 21  --   --   --  17 19  ALKPHOS 139* 142*  --   --   --  123 146*  PROT 6.3* 6.7  --   --   --  6.4* 6.0*  ALBUMIN  3.7 4.1   < > 3.5 3.3* 3.8 3.6   < > = values in this interval not displayed.    TUMOR MARKERS: No results for input(s): AFPTM, CEA, CA199, CHROMGRNA in the last 8760 hours.  Assessment and Plan: Per Dr. 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 Persistent anemia on maximum dose luspatercept  suggests suboptimal response and possible disease progression. Further evaluation needed to determine predominant disease component. - Continued luspatercept  injections at maximum dose every three weeks. - Ordered repeat bone marrow biopsy within the next month to assess disease status and determine if myelofibrosis or myelodysplasia is predominant. - Will evaluate bone marrow results to consider JAK inhibitors as additonal treatment options - Scheduled follow-up in approximately two months or sooner if  bone marrow results are available.  Patient presents for scheduled bone marrow biopsy and aspiration in IR today.  Patient has been NPO since midnight in anticipation of moderate sedation. All labs and medications are within acceptable parameters.  Preprocedural Hgb 7.0, down from 7.2 on 12/31. Allergies reviewed: Quinolones; meloxicam.  Risks and benefits of bone marrow biopsy and aspiration  was discussed with the patient and/or patient's family including, but not limited to bleeding, infection, damage to adjacent structures or low yield requiring additional tests.  All of the questions were answered and there is agreement to proceed.  Consent signed and in chart.    Thank you for allowing our service to participate in DILLAN CANDELA 's care.  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   "

## 2024-06-17 NOTE — Telephone Encounter (Signed)
 Patient for IR Bone Marrow Biopsy on Tues 06/18/24, I called and spoke with the patient's wife, Maceo on the phone and gave pre-procedure instructions. Maceo was made aware to have the patient here at 8:30a, NPO after MN prior to procedure as well as driver post procedure/recovery/discharge. Maceo stated understanding. Called 06/17/24

## 2024-06-18 ENCOUNTER — Encounter: Payer: Self-pay | Admitting: Radiology

## 2024-06-18 ENCOUNTER — Ambulatory Visit
Admission: RE | Admit: 2024-06-18 | Discharge: 2024-06-18 | Disposition: A | Discharge status: Home / Self Care | Source: Ambulatory Visit | Attending: Oncology | Admitting: Oncology

## 2024-06-18 ENCOUNTER — Other Ambulatory Visit: Payer: Self-pay

## 2024-06-18 VITALS — BP 146/71 | HR 67 | Temp 97.8°F | Resp 15 | Ht 70.0 in | Wt 158.0 lb

## 2024-06-18 DIAGNOSIS — E785 Hyperlipidemia, unspecified: Secondary | ICD-10-CM | POA: Insufficient documentation

## 2024-06-18 DIAGNOSIS — Z79890 Hormone replacement therapy (postmenopausal): Secondary | ICD-10-CM | POA: Diagnosis not present

## 2024-06-18 DIAGNOSIS — Z8673 Personal history of transient ischemic attack (TIA), and cerebral infarction without residual deficits: Secondary | ICD-10-CM | POA: Diagnosis not present

## 2024-06-18 DIAGNOSIS — Z01818 Encounter for other preprocedural examination: Secondary | ICD-10-CM

## 2024-06-18 DIAGNOSIS — E039 Hypothyroidism, unspecified: Secondary | ICD-10-CM | POA: Diagnosis not present

## 2024-06-18 DIAGNOSIS — Z79899 Other long term (current) drug therapy: Secondary | ICD-10-CM | POA: Insufficient documentation

## 2024-06-18 DIAGNOSIS — N400 Enlarged prostate without lower urinary tract symptoms: Secondary | ICD-10-CM | POA: Diagnosis not present

## 2024-06-18 DIAGNOSIS — K219 Gastro-esophageal reflux disease without esophagitis: Secondary | ICD-10-CM | POA: Insufficient documentation

## 2024-06-18 DIAGNOSIS — I10 Essential (primary) hypertension: Secondary | ICD-10-CM | POA: Diagnosis not present

## 2024-06-18 DIAGNOSIS — D469 Myelodysplastic syndrome, unspecified: Secondary | ICD-10-CM | POA: Diagnosis present

## 2024-06-18 HISTORY — PX: IR BONE MARROW BIOPSY & ASPIRATION: IMG5727

## 2024-06-18 LAB — CBC WITH DIFFERENTIAL/PLATELET
Abs Immature Granulocytes: 1.06 K/uL — ABNORMAL HIGH (ref 0.00–0.07)
Basophils Absolute: 0.1 K/uL (ref 0.0–0.1)
Basophils Relative: 1 %
Eosinophils Absolute: 0 K/uL (ref 0.0–0.5)
Eosinophils Relative: 0 %
HCT: 22.4 % — ABNORMAL LOW (ref 39.0–52.0)
Hemoglobin: 7 g/dL — ABNORMAL LOW (ref 13.0–17.0)
Immature Granulocytes: 9 %
Lymphocytes Relative: 16 %
Lymphs Abs: 1.9 K/uL (ref 0.7–4.0)
MCH: 28.1 pg (ref 26.0–34.0)
MCHC: 31.3 g/dL (ref 30.0–36.0)
MCV: 90 fL (ref 80.0–100.0)
Monocytes Absolute: 1.3 K/uL — ABNORMAL HIGH (ref 0.1–1.0)
Monocytes Relative: 11 %
Neutro Abs: 7.2 K/uL (ref 1.7–7.7)
Neutrophils Relative %: 63 %
Platelets: 295 K/uL (ref 150–400)
RBC: 2.49 MIL/uL — ABNORMAL LOW (ref 4.22–5.81)
RDW: 20.4 % — ABNORMAL HIGH (ref 11.5–15.5)
WBC: 11.6 K/uL — ABNORMAL HIGH (ref 4.0–10.5)
nRBC: 2.8 % — ABNORMAL HIGH (ref 0.0–0.2)

## 2024-06-18 MED ORDER — FENTANYL CITRATE (PF) 100 MCG/2ML IJ SOLN
INTRAMUSCULAR | Status: AC
  Filled 2024-06-18: qty 2

## 2024-06-18 MED ORDER — LIDOCAINE 1 % OPTIME INJ - NO CHARGE
5.0000 mL | Freq: Once | INTRAMUSCULAR | Status: AC
  Administered 2024-06-18: 5 mL via INTRADERMAL

## 2024-06-18 MED ORDER — SODIUM CHLORIDE 0.9 % IV SOLN: INTRAVENOUS | Status: DC

## 2024-06-18 MED ORDER — HEPARIN SOD (PORK) LOCK FLUSH 100 UNIT/ML IV SOLN
INTRAVENOUS | Status: AC
  Filled 2024-06-18: qty 5

## 2024-06-18 MED ORDER — MIDAZOLAM HCL 2 MG/2ML IJ SOLN
INTRAMUSCULAR | Status: AC
  Filled 2024-06-18: qty 2

## 2024-06-18 MED ORDER — FENTANYL CITRATE (PF) 100 MCG/2ML IJ SOLN
INTRAMUSCULAR | Status: AC | PRN
  Administered 2024-06-18: 25 ug via INTRAVENOUS

## 2024-06-18 MED ORDER — MIDAZOLAM HCL (PF) 2 MG/2ML IJ SOLN
INTRAMUSCULAR | Status: AC | PRN
  Administered 2024-06-18: 1 mg via INTRAVENOUS

## 2024-06-18 NOTE — Procedures (Signed)
 Interventional Radiology Procedure Note  Procedure: Fluoro guided Bone Marrow biopsy (iliac bone)  Complications: None  Estimated Blood Loss: < 10 mL  Findings: Fluoro guided bone marrow biopsy of the left iliac bone.  Core samples sent to pathology for further processing.  Dry tap--no aspirate despite mult attempts   Cordella DELENA Banner, MD

## 2024-06-18 NOTE — Discharge Instructions (Signed)
 Bone Marrow Aspiration and Bone Marrow Biopsy, Adult, Care After This sheet gives you information about how to care for yourself after your procedure. If you have problems or questions, contact your health care provider.  What can I expect after the procedure?  After the procedure, it is common to have: Mild pain and tenderness. Swelling. Bruising.  Follow these instructions at home: Take over-the-counter or prescription medicines only as told by your health care provider. You may shower tomorrow Remove band aid tomorrow, replace with another bandaid if  site has any drainage from biopsy site. Wash your hands with soap and water before you touch your biopsy site  If soap and water are not available, use hand sanitizer. Change your dressing frequently for bleeding and/or drainage. Check your puncture site every day for signs of infection. Check for: More redness, swelling, or pain. More fluid or blood. Warmth. Pus or a bad smell. Return to your normal activities in 24hours.  Do not drive for 24 hours if you were given a medicine to help you relax (sedative). Keep all follow-up visits as told by your health care provider. This is important. Contact a health care provider if: You have more redness, swelling, or pain around the puncture site. You have more fluid or blood coming from the puncture site. Your puncture site feels warm to the touch. You have pus or a bad smell coming from the puncture site. You have a fever. Your pain is not controlled with medicine. This information is not intended to replace advice given to you by your health care provider. Make sure you discuss any questions you have with your health care provider. Document Released: 12/10/2004 Document Revised: 12/11/2015 Document Reviewed: 11/04/2015 Elsevier Interactive Patient Education  2018 [employer].

## 2024-06-19 ENCOUNTER — Inpatient Hospital Stay

## 2024-06-19 ENCOUNTER — Inpatient Hospital Stay: Admitting: Oncology

## 2024-06-19 ENCOUNTER — Encounter: Payer: Self-pay | Admitting: Oncology

## 2024-06-19 VITALS — BP 125/90 | HR 86 | Temp 97.5°F | Resp 18 | Ht 70.0 in | Wt 160.2 lb

## 2024-06-19 DIAGNOSIS — Z79899 Other long term (current) drug therapy: Secondary | ICD-10-CM

## 2024-06-19 DIAGNOSIS — N183 Chronic kidney disease, stage 3 unspecified: Secondary | ICD-10-CM | POA: Diagnosis not present

## 2024-06-19 DIAGNOSIS — D649 Anemia, unspecified: Secondary | ICD-10-CM

## 2024-06-19 DIAGNOSIS — D469 Myelodysplastic syndrome, unspecified: Secondary | ICD-10-CM

## 2024-06-19 DIAGNOSIS — Z992 Dependence on renal dialysis: Secondary | ICD-10-CM | POA: Diagnosis not present

## 2024-06-19 DIAGNOSIS — D631 Anemia in chronic kidney disease: Secondary | ICD-10-CM | POA: Diagnosis not present

## 2024-06-19 DIAGNOSIS — D7581 Myelofibrosis: Secondary | ICD-10-CM

## 2024-06-19 DIAGNOSIS — Z5111 Encounter for antineoplastic chemotherapy: Secondary | ICD-10-CM

## 2024-06-19 LAB — CBC WITH DIFFERENTIAL (CANCER CENTER ONLY)
Abs Immature Granulocytes: 0.93 K/uL — ABNORMAL HIGH (ref 0.00–0.07)
Basophils Absolute: 0.1 K/uL (ref 0.0–0.1)
Basophils Relative: 1 %
Eosinophils Absolute: 0 K/uL (ref 0.0–0.5)
Eosinophils Relative: 0 %
HCT: 22.3 % — ABNORMAL LOW (ref 39.0–52.0)
Hemoglobin: 7 g/dL — ABNORMAL LOW (ref 13.0–17.0)
Immature Granulocytes: 9 %
Lymphocytes Relative: 14 %
Lymphs Abs: 1.6 K/uL (ref 0.7–4.0)
MCH: 28.5 pg (ref 26.0–34.0)
MCHC: 31.4 g/dL (ref 30.0–36.0)
MCV: 90.7 fL (ref 80.0–100.0)
Monocytes Absolute: 1.2 K/uL — ABNORMAL HIGH (ref 0.1–1.0)
Monocytes Relative: 12 %
Neutro Abs: 7 K/uL (ref 1.7–7.7)
Neutrophils Relative %: 64 %
Platelet Count: 273 K/uL (ref 150–400)
RBC: 2.46 MIL/uL — ABNORMAL LOW (ref 4.22–5.81)
RDW: 19.9 % — ABNORMAL HIGH (ref 11.5–15.5)
Smear Review: NORMAL
WBC Count: 10.8 K/uL — ABNORMAL HIGH (ref 4.0–10.5)
nRBC: 2.4 % — ABNORMAL HIGH (ref 0.0–0.2)

## 2024-06-19 LAB — SAMPLE TO BLOOD BANK

## 2024-06-19 LAB — PREPARE RBC (CROSSMATCH)

## 2024-06-19 MED ORDER — LUSPATERCEPT-AAMT 75 MG ~~LOC~~ SOLR
125.0000 mg | Freq: Once | SUBCUTANEOUS | Status: AC
  Administered 2024-06-19: 125 mg via SUBCUTANEOUS
  Filled 2024-06-19: qty 1.5

## 2024-06-19 NOTE — Telephone Encounter (Signed)
 Outbound call to [facility] [employer]; representative indicated he started dialysis on 05/26/24 and is going to the [place] location.

## 2024-06-19 NOTE — Progress Notes (Signed)
 Patient states he's okay; just feeling tired & worn out.

## 2024-06-19 NOTE — Progress Notes (Signed)
 Spoke to [place] in [place] [facility] and requested to add ngs myeloid to bone marrow biopsy done yesterday 06/18/24.

## 2024-06-19 NOTE — Telephone Encounter (Signed)
 Spoke to [place] who indicated yes patient is receiving EPO miscera 50mg  every 2 weeks.

## 2024-06-19 NOTE — Progress Notes (Signed)
 "    Hematology/Oncology Consult note [facility]  [phone] [phone]  Patient Care Team: Vicci Duwaine SQUIBB, DO as PCP - General (Family Medicine) Perla Evalene PARAS, MD as PCP - Cardiology (Cardiology) Etha Quinten Ludwig, MD as Referring Physician (Hematology) Alvia Norleen BIRCH, MD as Consulting Physician (Ophthalmology) Rosemarie Eather RAMAN, MD as Consulting Physician (Neurology) Nieves Cough, MD as Consulting Physician (Urology) Perla Evalene PARAS, MD as Consulting Physician (Cardiology) Twylla Glendia BROCKS, MD (Urology) Melanee Annah BROCKS, MD as Consulting Physician (Oncology) Willma Camelia CROME, RN (Inactive) as [facility] Care Management Mertel, [place] , RN as [facility] Care Management   Name of the patient: [name]  [id number]  22-Apr-1941   Date of visit: 06/19/2024  Diagnosis-MDS/ MPN  Chief complaint/ Reason for visit-routine follow-up of MDS/MPN on luspatercept   Heme/Onc history: Patient is a 84 year old male diagnosed with primary myelofibrosis back in 2016.  At that time he was found to have a mild splenomegaly of 15.8 cm.DIPPS score is 21 (age 19- 1, hemoglobin less than 10- 2) and score of 4 if 1% circulating blasts included from 07/17/2014.   Bone marrow on 06/11/2014 was most consistent with primary myelofibrosis.  Bone marrow biopsy showed 1% abnormal cells: CD45+, CD5+, CD10, CD11c+/-, CD19+, CD2-+, (dim), CD22+ (dim, CD23+, CD38-/+, FMC7-, HLA-DR+, sig lambda+(dim).  Blasts were not increased 1.2%; hypercellular for age: 69%; JAK2 V617F mutation was negative.  CALR mutation positive.  Flow cytometry included about 1% CLL/SLL phenotype cells (CD5+) of uncertain significance and some infiltrate into the marrow with increased atypical megakaryocytes.  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  MDS FISH panel was negative.   Patient used to follow up with [facility] benign hematology Dr. Margart Ruddy for his anemia.   Patient's hemoglobin was drifting down to the eights and was started on EPO in October 2022.   Patient has had worsening anemia requiring intermittent blood transfusions despite receiving Retacrit  since October 2024.  PatientUnderwent a repeat bone marrow biopsy in February 2025.  Findings were consistent with myelofibrosis with evidence of possible dysplasia and increased ringed sideroblasts and questionable myeloid dysgranulopoiesis.  Cytogenetic studies could not be completed due to no metaphase cells available for analysis.  Ne[employer] profile was also not completed due to limited specimen and poor quality DNA in the specimen.  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  He underwent a bone marrow biopsy yesterday without significant pain or discomfort; results are pending and expected in two to three weeks.  His hemoglobin was measured at 7 g/dL today. He receives injections every three weeks, though today's dose has not yet been administered.  Hemodialysis was initiated three weeks ago, initially three times weekly and now twice weekly on Tuesdays and Saturdays. Dialysis is currently performed via an upper access site. He has not yet started erythropoietin  injections with dialysis and is uncertain about additional medications administered through the dialysis machine.       ECOG PS- 2 Pain scale- 0  Review of systems- Review of Systems  Constitutional:  Positive for malaise/fatigue. Negative for chills, fever and weight loss.  HENT:  Negative for congestion, ear discharge and nosebleeds.   Eyes:  Negative for  blurred vision.  Respiratory:  Negative for cough, hemoptysis, sputum production, shortness of breath and wheezing.   Cardiovascular:  Negative for chest pain, palpitations, orthopnea and claudication.  Gastrointestinal:  Negative for abdominal pain, blood in stool, constipation, diarrhea, heartburn, melena, nausea and vomiting.  Genitourinary:  Negative for dysuria, flank pain, frequency, hematuria and urgency.  Musculoskeletal:  Negative for back pain, joint pain and myalgias.  Skin:  Negative for rash.  Neurological:  Negative for dizziness, tingling, focal weakness, seizures, weakness and headaches.  Endo/Heme/Allergies:  Does not bruise/bleed easily.  Psychiatric/Behavioral:  Negative for depression and suicidal ideas. The patient does not have insomnia.       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   right forearm, [facility]   Stroke (HCC) 11/2007   R brain subcortical infarct     Past Surgical History:  Procedure Laterality Date   ASPIRATION / INJECTION RENAL CYST  07/08/2015   BACK SURGERY     approx 20- 25 years ago   COLONOSCOPY     DIALYSIS/PERMA CATHETER INSERTION Right 05/20/2024   Procedure: DIALYSIS/PERMA CATHETER INSERTION;  Surgeon: Marea Selinda RAMAN, MD;  Location: [facility] INVASIVE CV LAB;  Service: Cardiovascular;  Laterality: Right;   EYE SURGERY     cataract both eyes   GAS INSERTION  08/11/2011   Procedure: INSERTION OF GAS;  Surgeon:  Norleen JONETTA Ku, MD;  Location: [facility] OR;  Service: Ophthalmology;  Laterality: Right;  [id number]   IR BONE MARROW BIOPSY & ASPIRATION  08/03/2023   IR BONE MARROW BIOPSY & ASPIRATION  06/18/2024   IR FLUORO GUIDE CV LINE RIGHT  10/23/2023   IR REMOVAL TUN CV CATH W/O FL  11/07/2023   IR THORACENTESIS RIGHT ASP PLEURAL SPACE W/IMG GUIDE  09/22/2023   IR THORACENTESIS RIGHT ASP PLEURAL SPACE W/IMG GUIDE  10/02/2023   LEFT HEART CATH AND CORONARY ANGIOGRAPHY N/A 05/02/2023   Procedure: LEFT HEART CATH AND CORONARY ANGIOGRAPHY;  Surgeon: Mady Bruckner, MD;  Location: [facility] INVASIVE CV LAB;  Service: Cardiovascular;  Laterality: N/A;   REVERSE SHOULDER ARTHROPLASTY Right 04/13/2023   Procedure: REVERSE SHOULDER ARTHROPLASTY;  Surgeon: Edie Norleen PARAS, MD;  Location: [facility] ORS;  Service: Orthopedics;  Laterality: Right;   SCLERAL BUCKLE  08/11/2011   Procedure: SCLERAL BUCKLE;  Surgeon: Norleen JONETTA Ku, MD;  Location: [facility] OR;  Service: Ophthalmology;  Laterality: Right;   VARICOSE VEIN SURGERY      Social History   Socioeconomic History   Marital status: Married    Spouse name: Maceo    Number of children: 2   Years of education: 12+   Highest education level: Some college, no degree  Occupational History   Occupation: Fish Farm Manager: OTHER    Comment: community   Occupation: SELF EMPLOYED  Employer: SELF EMPLOYED  Tobacco Use   Smoking status: Former    Current packs/day: 0.00    Average packs/day: 0.3 packs/day    Types: Cigarettes    Quit date: 06/06/2010    Years since quitting: 14.0   Smokeless tobacco: Never  Vaping Use   Vaping status: Never Used  Substance and Sexual Activity   Alcohol use: No    Alcohol/week: 0.0 standard drinks of alcohol   Drug use: No   Sexual activity: Not Currently  Other Topics Concern   Not on file  Social History Narrative   Pt lives at home with his family.   Caffeine Use- 2 cups daily   Patient has 2 children.    Patient has some college.     Patient is right handed.          Works full time   Social Drivers of Health   Tobacco Use: Medium Risk (06/19/2024)   Patient History    Smoking Tobacco Use: Former    Smokeless Tobacco Use: Never    Passive Exposure: Not on file  Financial Resource Strain: Low Risk  (05/14/2024)   Received from [facility]   Overall Financial Resource Strain (CARDIA)    Difficulty of Paying Living Expenses: Not hard at all  Food Insecurity: No Food Insecurity (05/17/2024)   Epic    Worried About Radiation Protection Practitioner of Food in the Last Year: Never true    Ran Out of Food in the Last Year: Never true  Transportation Needs: No Transportation Needs (05/17/2024)   Epic    Lack of Transportation (Medical): No    Lack of Transportation (Non-Medical): No  Physical Activity: Inactive (07/12/2021)   Exercise Vital Sign    Days of Exercise per Week: 0 days    Minutes of Exercise per Session: 0 min  Stress: No Stress Concern Present (07/12/2021)   [employer] of Occupational Health - Occupational Stress Questionnaire    Feeling of Stress : Not at all  Social Connections: Socially Integrated (05/17/2024)   Social Connection and Isolation Panel    Frequency of Communication with Friends and Family: More than three times a week    Frequency of Social Gatherings with Friends and Family: More than three times a week    Attends Religious Services: More than 4 times per year    Active Member of Clubs or Organizations: Yes    Attends Banker Meetings: More than 4 times per year    Marital Status: Married  Catering Manager Violence: Not At Risk (05/17/2024)   Epic    Fear of Current or Ex-Partner: No    Emotionally Abused: No    Physically Abused: No    Sexually Abused: No  Depression (PHQ2-9): Low Risk (06/19/2024)   Depression (PHQ2-9)    PHQ-2 Score: 0  Alcohol Screen: Low Risk (07/12/2021)   Alcohol Screen    Last Alcohol Screening Score (AUDIT): 0  Housing: Low Risk  (05/17/2024)   Epic    Unable to Pay for Housing in the Last Year: No    Number of Times Moved in the Last Year: 0    Homeless in the Last Year: No  Utilities: Not At Risk (05/17/2024)   Epic    Threatened with loss of utilities: No  Health Literacy: Medium Risk (06/09/2023)   Received from [facility] Literacy    How often do you need to have someone help you when you read instructions,  pamphlets, or other written material from your doctor or pharmacy?: Rarely    Family History  Problem Relation Age of Onset   Heart disease Father    Dementia Sister    Diabetes Son    Kidney disease Neg Hx    Prostate cancer Neg Hx     Current Medications[2]  Physical exam:  Vitals:   06/19/24 0959  BP: (!) 125/90  Pulse: 86  Resp: 18  Temp: (!) 97.5 F (36.4 C)  TempSrc: Tympanic  SpO2: 100%  Weight: 160 lb 3.2 oz (72.7 kg)  Height: 5' 10 (1.778 m)   Physical Exam Constitutional:      Comments: Sitting in a wheelchair.  Appears in no acute distress  Cardiovascular:     Rate and Rhythm: Normal rate and regular rhythm.     Heart sounds: Normal heart sounds.  Pulmonary:     Effort: Pulmonary effort is normal.     Breath sounds: Normal breath sounds.  Skin:    General: Skin is warm and dry.  Neurological:     Mental Status: He is alert and oriented to person, place, and time.      I have personally reviewed labs listed below:    Latest Ref Rng & Units 06/05/2024   10:55 AM  CMP  Glucose 70 - 99 mg/dL 97   BUN 8 - 23 mg/dL 29   Creatinine 9.38 - 1.24 mg/dL 6.44   Sodium 864 - 854 mmol/L 139   Potassium 3.5 - 5.1 mmol/L 4.6   Chloride 98 - 111 mmol/L 101   CO2 22 - 32 mmol/L 26   Calcium  8.9 - 10.3 mg/dL 8.1   Total Protein 6.5 - 8.1 g/dL 6.0   Total Bilirubin 0.0 - 1.2 mg/dL 0.6   Alkaline Phos 38 - 126 U/L 146   AST 15 - 41 U/L 29   ALT 0 - 44 U/L 19       Latest Ref Rng & Units 06/19/2024    9:36 AM  CBC  WBC 4.0 - 10.5 K/uL 10.8   Hemoglobin  13.0 - 17.0 g/dL 7.0   Hematocrit 60.9 - 52.0 % 22.3   Platelets 150 - 400 K/uL 273    I have personally reviewed Radiology images listed below: No images are attached to the encounter.  IR BONE MARROW BIOPSY & ASPIRATION Result Date: 06/18/2024 CLINICAL DATA:  Myelodysplastic syndrome evaluation. History of myelofibrosis. EXAM: Fluoroscopic guided bone marrow biopsy TECHNIQUE: Fluoroscopy CONTRAST:  None RADIOPHARMACEUTICALS:  None FLUOROSCOPY: 20 mGy COMPARISON:  None FINDINGS: The patient was placed in prone position on the IR table. Radiopaque markers were placed on the patient's skin and initial imaging of the pelvis was performed. The patient's skin was then prepped and draped in the usual sterile fashion. Moderate sedation was provided for by the nursing staff under my supervision utilizing intravenous Versed  and fentanyl . The nurse had no other duties other than monitoring the patient and providing sedation during the procedure. I was present for the entire procedure. 1 mg intravenous Versed  and 25 mcg intravenous fentanyl  were administered for a total sedation time of 9 minutes. 1% lidocaine  was used to infiltrate the skin at the access site prior to a stab incision. Local anesthesia was then used to infiltrate the region of soft tissue from the skin to the left iliac bone. The bone marrow needle was then advanced and imaging demonstrated the needle tip to be in the cortex of the left iliac bone. The bone  was then penetrated and aspiration was performed, however no aspirate obtained. The needle was withdrawn and a subsequent attempt was made for aspirate which was also unsuccessful. A core sample was then obtained. Multiple attempts at sampling was performed in order to get 2 1 cm segments. All needles were then removed from the patient. Sterile dressing was applied. IMPRESSION: Satisfactory core needle biopsy of the left iliac bone marrow under fluoroscopic guidance. No aspirate obtained despite  multiple attempts. Electronically Signed   By: Cordella Banner   On: 06/18/2024 10:01   DG Chest 1 View Result Date: 05/20/2024 EXAM: 1 VIEW(S) XRAY OF THE CHEST 05/20/2024 01:37:10 PM COMPARISON: 10/24/2023 CLINICAL HISTORY: ESRD (end stage renal disease) (HCC) FINDINGS: LINES, TUBES AND DEVICES: Right chest wall dialysis catheter in place with tip overlying the expected region of the superior cavoatrial junction. LUNGS AND PLEURA: Chronic Right base airspace opacity. Chronic Small right pleural effusion. No pneumothorax. HEART AND MEDIASTINUM: Cardiomegaly. Atherosclerotic plaque. BONES AND SOFT TISSUES: Right shoulder arthroplasty noted. IMPRESSION: 1. Right chest wall dialysis catheter in expected position with tip at the superior cavoatrial junction. 2. Chronic right basilar airspace opacity with small right pleural effusion. Electronically signed by: Oneil Devonshire MD 05/20/2024 07:38 PM EST RP Workstation: [id number]     Assessment and plan- Patient is a 84 y.o. 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 Bone marrow biopsy results pending. Transfusion indicated. - Administer blood transfusion this week. - Continue current luspatercept  regimen every three weeks.  He is on maximum doses at 1.75 mg/kg dosing - Review bone marrow biopsy results in 2-3 weeks to guide further management.  End stage renal disease on dialysis Recently initiated hemodialysis three weeks ago, currently on a twice-weekly schedule via catheter. Evaluation ongoing for transition to left arm fistula. Erythropoietin  administration status unclear. - Contact dialysis team to confirm erythropoietin  administration with dialysis. - Discuss ongoing plans for vascular access (fistula) for dialysis.         Visit Diagnosis 1. Anemia of chronic kidney failure, stage 3 (moderate) (HCC)   2.  Encounter for antineoplastic chemotherapy   3. Symptomatic anemia      Dr. Annah Skene, MD, MPH [facility] [phone] 06/19/2024 12:58 PM                   [1]  Allergies Allergen Reactions   Quinolones Other (See Comments)    Patient with aortic aneurysm. Use of quinolones are considered to be contraindicated due to risk of aortic rupture.   Meloxicam Nausea And Vomiting  [2]  Current Outpatient Medications:    acetaminophen  (TYLENOL ) 500 MG tablet, Take 1,000 mg by mouth every 6 (six) hours as needed for mild pain (pain score 1-3)., Disp: , Rfl:    amiodarone  (PACERONE ) 200 MG tablet, Take 1 tablet (200 mg total) by mouth daily., Disp: 30 tablet, Rfl: 0   amLODipine  (NORVASC ) 10 MG tablet, Take 1 tablet (10 mg total) by mouth daily., Disp: 30 tablet, Rfl: 0   cyanocobalamin  1000 MCG tablet, Take 1 tablet (1,000 mcg total) by mouth daily., Disp: 30 tablet, Rfl: 0   DULoxetine  (CYMBALTA ) 20 MG capsule, Take 1 capsule (20 mg total) by mouth at bedtime., Disp: 90 capsule, Rfl: 1   finasteride  (PROSCAR ) 5 MG tablet, Take 5 mg by mouth daily., Disp: , Rfl:  lactulose  (CHRONULAC ) 10 GM/15ML solution, Take 30 mLs (20 g total) by mouth 2 (two) times daily as needed for mild constipation., Disp: 236 mL, Rfl: 0   levothyroxine  (SYNTHROID ) 150 MCG tablet, Take 1 tablet (150 mcg total) by mouth daily before breakfast., Disp: 90 tablet, Rfl: 0   metoprolol  succinate (TOPROL -XL) 25 MG 24 hr tablet, Take 1 tablet (25 mg total) by mouth 2 (two) times daily., Disp: 180 tablet, Rfl: 1   Multiple Vitamin (MULITIVITAMIN WITH MINERALS) TABS, Take 1 tablet by mouth in the morning., Disp: , Rfl:    ondansetron  (ZOFRAN ) 4 MG tablet, Take 1 tablet (4 mg total) by mouth every 6 (six) hours as needed for nausea., Disp: 20 tablet, Rfl: 0   pantoprazole  (PROTONIX ) 40 MG tablet, Take 1 tablet (40 mg total) by mouth daily., Disp: 90 tablet, Rfl: 1   simvastatin  (ZOCOR ) 40  MG tablet, Take 1 tablet (40 mg total) by mouth daily., Disp: 90 tablet, Rfl: 1   sodium bicarbonate  650 MG tablet, Take 1 tablet (650 mg total) by mouth 3 (three) times daily., Disp: , Rfl:    tamsulosin  (FLOMAX ) 0.4 MG CAPS capsule, Take 1 capsule (0.4 mg total) by mouth daily., Disp: 90 capsule, Rfl: 1   traMADol  (ULTRAM ) 50 MG tablet, Take 1 tablet (50 mg total) by mouth every 12 (twelve) hours as needed for moderate pain (pain score 4-6)., Disp: 30 tablet, Rfl: 0   triamcinolone  ointment (KENALOG ) 0.5 %, Apply 1 Application topically 2 (two) times daily., Disp: 30 g, Rfl: 0 No current facility-administered medications for this visit.  Facility-Administered Medications Ordered in Other Visits:    epoetin  alfa-epbx (RETACRIT ) injection 10,000 Units, 10,000 Units, Subcutaneous, Once, Melanee Annah BROCKS, MD   epoetin  alfa-epbx (RETACRIT ) injection 40,000 Units, 40,000 Units, Subcutaneous, Once, Melanee Annah BROCKS, MD  "

## 2024-06-20 ENCOUNTER — Inpatient Hospital Stay

## 2024-06-20 DIAGNOSIS — D649 Anemia, unspecified: Secondary | ICD-10-CM

## 2024-06-20 MED ORDER — SODIUM CHLORIDE 0.9% IV SOLUTION
250.0000 mL | INTRAVENOUS | Status: DC
  Administered 2024-06-20: 100 mL via INTRAVENOUS
  Filled 2024-06-20: qty 250

## 2024-06-20 MED ORDER — ACETAMINOPHEN 325 MG PO TABS
650.0000 mg | ORAL_TABLET | Freq: Once | ORAL | Status: AC
  Administered 2024-06-20: 650 mg via ORAL
  Filled 2024-06-20: qty 2

## 2024-06-21 LAB — TYPE AND SCREEN
ABO/RH(D): O POS
Antibody Screen: NEGATIVE
Unit division: 0

## 2024-06-21 LAB — BPAM RBC
Blood Product Expiration Date: 202602072359
ISSUE DATE / TIME: 202601150943
Unit Type and Rh: 5100

## 2024-06-24 ENCOUNTER — Other Ambulatory Visit (INDEPENDENT_AMBULATORY_CARE_PROVIDER_SITE_OTHER): Payer: Self-pay | Admitting: Nurse Practitioner

## 2024-06-24 DIAGNOSIS — N185 Chronic kidney disease, stage 5: Secondary | ICD-10-CM

## 2024-06-26 ENCOUNTER — Encounter (INDEPENDENT_AMBULATORY_CARE_PROVIDER_SITE_OTHER): Payer: Self-pay | Admitting: Nurse Practitioner

## 2024-06-26 ENCOUNTER — Other Ambulatory Visit (INDEPENDENT_AMBULATORY_CARE_PROVIDER_SITE_OTHER)

## 2024-06-26 ENCOUNTER — Encounter (HOSPITAL_COMMUNITY): Payer: Self-pay

## 2024-06-26 ENCOUNTER — Ambulatory Visit (INDEPENDENT_AMBULATORY_CARE_PROVIDER_SITE_OTHER): Admitting: Nurse Practitioner

## 2024-06-26 VITALS — BP 151/73 | HR 69 | Resp 17 | Ht 70.0 in

## 2024-06-26 DIAGNOSIS — N186 End stage renal disease: Secondary | ICD-10-CM | POA: Diagnosis not present

## 2024-06-26 DIAGNOSIS — Z992 Dependence on renal dialysis: Secondary | ICD-10-CM

## 2024-06-26 DIAGNOSIS — N185 Chronic kidney disease, stage 5: Secondary | ICD-10-CM

## 2024-06-26 DIAGNOSIS — I10 Essential (primary) hypertension: Secondary | ICD-10-CM | POA: Diagnosis not present

## 2024-06-27 ENCOUNTER — Telehealth: Payer: Self-pay | Admitting: Pharmacist

## 2024-06-27 ENCOUNTER — Ambulatory Visit: Admitting: Emergency Medicine

## 2024-06-27 ENCOUNTER — Other Ambulatory Visit (HOSPITAL_COMMUNITY): Payer: Self-pay

## 2024-06-27 ENCOUNTER — Encounter: Payer: Self-pay | Admitting: Oncology

## 2024-06-27 ENCOUNTER — Telehealth: Payer: Self-pay | Admitting: Pharmacy Technician

## 2024-06-27 ENCOUNTER — Telehealth: Payer: Self-pay

## 2024-06-27 VITALS — Ht 70.0 in | Wt 160.0 lb

## 2024-06-27 DIAGNOSIS — Z0000 Encounter for general adult medical examination without abnormal findings: Secondary | ICD-10-CM | POA: Diagnosis not present

## 2024-06-27 DIAGNOSIS — D7581 Myelofibrosis: Secondary | ICD-10-CM

## 2024-06-27 MED ORDER — MOMELOTINIB DIHYDROCHLORIDE 100 MG PO TABS: 100.0000 mg | ORAL_TABLET | Freq: Every day | ORAL | 0 refills | Status: DC

## 2024-06-27 NOTE — Telephone Encounter (Signed)
 Oral Oncology Patient Advocate Encounter   Member ID: [id number]  New authorization   Received notification that prior authorization for Ojjaara is required.   PA submitted on CMM via Latent Key [id number] Status is pending     Katherleen Folkes (Patty) Chet Burnet, CPhT  [facility], [place], [facility] - Oral Chemotherapy Patient Advocate Specialist III Phone: [phone]  Fax: [phone]

## 2024-06-27 NOTE — Telephone Encounter (Signed)
 Oral Oncology Patient Advocate Encounter  Prior Authorization for [name] has been approved.    PA# [id number] Effective dates: 06/28/2023 through 06/05/2025  Patients co-pay is $2,100.   Patient on wait list for grant.  I will start on PAP in the meantime.   [name] Chet Burnet, CPhT  [facility], [place], [facility] - Oral Chemotherapy Patient Advocate Specialist III Phone: [phone]  Fax: [phone]

## 2024-06-27 NOTE — Telephone Encounter (Signed)
 Clinical Pharmacist Practitioner Encounter   Received new prescription for Ojjaara (momelotinib) for the treatment of myelofibrosis, planned duration until disease progression or unacceptable drug toxicity. Planned star, after 07/10/24 office visit.   CMP from 06/05/24 assessed, LFTs wnl. Repeat CMP ordered for 07/10/24. Prescription dose and frequency assessed.   Patient receives hemodialysis. MD aware there is no available data on the use of momelotinib in patients with end stage renal disease receiving dialysis. We do know that momelotinib normal dosing can be used down to an eGFR of 16.4 and the medications is 28% renally excreted. Patient will be started on a reduced dose and dose increased as tolerated.   Current medication list in Epic reviewed, no DDIs with momelitinib identified.  Evaluated chart and no patient barriers to medication adherence identified.   Prescription has been e-scribed to the [facility] for benefits analysis and approval.  Oral Oncology Clinic will continue to follow for insurance authorization, copayment issues, initial counseling and start date.   Melena Hayes N. Cheyanna Strick, PharmD, BCOP, CPP Hematology/Oncology Clinical Pharmacist [facility] Oral Chemotherapy Navigation Clinic [phone]  06/27/2024 1:39 PM

## 2024-06-27 NOTE — Patient Instructions (Addendum)
 [name],  Thank you for taking the time for your Medicare Wellness Visit. I appreciate your continued commitment to your health goals. Please review the care plan we discussed, and feel free to reach out if I can assist you further.  Please note that Annual Wellness Visits do not include a physical exam. Some assessments may be limited, especially if the visit was conducted virtually. If needed, we may recommend an in-person follow-up with your provider.  Ongoing Care Seeing your primary care provider every 3 to 6 months helps us  monitor your health and provide consistent, personalized care.   Referrals If a referral was made during today's visit and you haven't received any updates within two weeks, please contact the referred provider directly to check on the status.  Recommended Screenings:  You may get a Shingles vaccine at your local pharmacy at your convenience.   Health Maintenance  Topic Date Due   Zoster (Shingles) Vaccine (1 of 2) 07/21/1959   COVID-19 Vaccine (3 - Pfizer risk series) 09/19/2019   DTaP/Tdap/Td vaccine (2 - Tdap) 10/12/2026   Pneumococcal Vaccine for age over 32  Completed   Flu Shot  Completed   Meningitis B Vaccine  Aged Out   Hepatitis B Vaccine  Discontinued   Hepatitis C Screening  Discontinued       06/27/2024   10:11 AM  Advanced Directives  Does Patient Have a Medical Advance Directive? Yes  Type of Estate Agent of [place];Living will  Does patient want to make changes to medical advance directive? No - Patient declined  Copy of Healthcare Power of Attorney in Chart? Yes - validated most recent copy scanned in chart (See row information)    Vision: Annual vision screenings are recommended for early detection of glaucoma, cataracts, and diabetic retinopathy. These exams can also reveal signs of chronic conditions such as diabetes and high blood pressure.  Dental: Annual dental screenings help detect early signs of oral  cancer, gum disease, and other conditions linked to overall health, including heart disease and diabetes.  Please see the attached documents for additional preventive care recommendations.   Managing Pain Without Opioids Opioids are strong medicines used to treat moderate to severe pain. For some people, especially those who have long-term (chronic) pain, opioids may not be the best choice for pain management due to: Side effects like nausea, constipation, and sleepiness. The risk of addiction (opioid use disorder). The longer you take opioids, the greater your risk of addiction. Pain that lasts for more than 3 months is called chronic pain. Managing chronic pain usually requires more than one approach and is often provided by a team of health care providers working together (multidisciplinary approach). Pain management may be done at a pain management center or pain clinic. How to manage pain without the use of opioids Use non-opioid medicines Non-opioid medicines for pain may include: Over-the-counter or prescription non-steroidal anti-inflammatory drugs (NSAIDs). These may be the first medicines used for pain. They work well for muscle and bone pain, and they reduce swelling. Acetaminophen . This over-the-counter medicine may work well for milder pain but not swelling. Antidepressants. These may be used to treat chronic pain. A certain type of antidepressant (tricyclics) is often used. These medicines are given in lower doses for pain than when used for depression. Anticonvulsants. These are usually used to treat seizures but may also reduce nerve (neuropathic) pain. Muscle relaxants. These relieve pain caused by sudden muscle tightening (spasms). You may also use a pain medicine  that is applied to the skin as a patch, cream, or gel (topical analgesic), such as a numbing medicine. These may cause fewer side effects than medicines taken by mouth. Do certain therapies as directed Some therapies  can help with pain management. They include: Physical therapy. You will do exercises to gain strength and flexibility. A physical therapist may teach you exercises to move and stretch parts of your body that are weak, stiff, or painful. You can learn these exercises at physical therapy visits and practice them at home. Physical therapy may also involve: Massage. Heat wraps or applying heat or cold to affected areas. Electrical signals that interrupt pain signals (transcutaneous electrical nerve stimulation, TENS). Weak lasers that reduce pain and swelling (low-level laser therapy). Signals from your body that help you learn to regulate pain (biofeedback). Occupational therapy. This helps you to learn ways to function at home and work with less pain. Recreational therapy. This involves trying new activities or hobbies, such as a physical activity or drawing. Mental health therapy, including: Cognitive behavioral therapy (CBT). This helps you learn coping skills for dealing with pain. Acceptance and commitment therapy (ACT) to change the way you think and react to pain. Relaxation therapies, including muscle relaxation exercises and mindfulness-based stress reduction. Pain management counseling. This may be individual, family, or group counseling.  Receive medical treatments Medical treatments for pain management include: Nerve block injections. These may include a pain blocker and anti-inflammatory medicines. You may have injections: Near the spine to relieve chronic back or neck pain. Into joints to relieve back or joint pain. Into nerve areas that supply a painful area to relieve body pain. Into muscles (trigger point injections) to relieve some painful muscle conditions. A medical device placed near your spine to help block pain signals and relieve nerve pain or chronic back pain (spinal cord stimulation device). Acupuncture. Follow these instructions at home Medicines Take  over-the-counter and prescription medicines only as told by your health care provider. If you are taking pain medicine, ask your health care providers about possible side effects to watch out for. Do not drive or use heavy machinery while taking prescription opioid pain medicine. Lifestyle  Do not use drugs or alcohol to reduce pain. If you drink alcohol, limit how much you have to: 0-1 drink a day for women who are not pregnant. 0-2 drinks a day for men. Know how much alcohol is in a drink. In the [place] one drink equals one 12 oz bottle of beer (355 mL), one 5 oz glass of wine (148 mL), or one 1 oz glass of hard liquor (44 mL). Do not use any products that contain nicotine or tobacco. These products include cigarettes, chewing tobacco, and vaping devices, such as e-cigarettes. If you need help quitting, ask your health care provider. Eat a healthy diet and maintain a healthy weight. Poor diet and excess weight may make pain worse. Eat foods that are high in fiber. These include fresh fruits and vegetables, whole grains, and beans. Limit foods that are high in fat and processed sugars, such as fried and sweet foods. Exercise regularly. Exercise lowers stress and may help relieve pain. Ask your health care provider what activities and exercises are safe for you. If your health care provider approves, join an exercise class that combines movement and stress reduction. Examples include yoga and tai chi. Get enough sleep. Lack of sleep may make pain worse. Lower stress as much as possible. Practice stress reduction techniques as told by your  therapist. General instructions Work with all your pain management providers to find the treatments that work best for you. You are an important member of your pain management team. There are many things you can do to reduce pain on your own. Consider joining an online or in-person support group for people who have chronic pain. Keep all follow-up visits. This  is important. Where to find more information You can find more information about managing pain without opioids from: American Academy of Pain Medicine: painmed.org Institute for Chronic Pain: instituteforchronicpain.org American Chronic Pain Association: theacpa.org Contact a health care provider if: You have side effects from pain medicine. Your pain gets worse or does not get better with treatments or home therapy. You are struggling with anxiety or depression. Summary Many types of pain can be managed without opioids. Chronic pain may respond better to pain management without opioids. Pain is best managed when you and a team of health care providers work together. Pain management without opioids may include non-opioid medicines, medical treatments, physical therapy, mental health therapy, and lifestyle changes. Tell your health care providers if your pain gets worse or is not being managed well enough. This information is not intended to replace advice given to you by your health care provider. Make sure you discuss any questions you have with your health care provider. Document Revised: 09/02/2020 Document Reviewed: 09/02/2020 Elsevier Patient Education  2024 [employer].

## 2024-06-27 NOTE — Progress Notes (Signed)
 "  Chief Complaint  Patient presents with   Medicare Wellness     Subjective:   [name] is a 84 y.o. male who presents for a Medicare Annual Wellness Visit.  Visit info / Clinical Intake: Medicare Wellness Visit Type:: Subsequent Annual Wellness Visit Persons participating in visit and providing information:: patient & caregiver (daughter, [name] helps with today's visit) Medicare Wellness Visit Mode:: Telephone If telephone:: video declined Since this visit was completed virtually, some vitals may be partially provided or unavailable. Missing vitals are due to the limitations of the virtual format.: Documented vitals are patient reported If Telephone or Video please confirm:: I connected with patient using audio/video enable telemedicine. I verified patient identity with two identifiers, discussed telehealth limitations, and patient agreed to proceed. Patient Location:: home Provider Location:: clinic office Interpreter Needed?: No Pre-visit prep was completed: yes AWV questionnaire completed by patient prior to visit?: no Living arrangements:: lives with spouse/significant other Patient's Overall Health Status Rating: good Typical amount of pain: some Does pain affect daily life?: no Are you currently prescribed opioids?: (!) yes  Dietary Habits and Nutritional Risks How many meals a day?: 3 Eats fruit and vegetables daily?: yes Most meals are obtained by: having others provide food (wife and daughter prepares meals) In the last 2 weeks, have you had any of the following?: none Diabetic:: no  Functional Status Activities of Daily Living (to include ambulation/medication): Independent Ambulation: Independent with device- listed below Home Assistive Devices/Equipment: Johna Finder (specify Type); Eyeglasses; Other (Comment); Shower/tub chair (rollator, hearing aids) Medication Administration: Needs assistance (comment) (wife manages medications) Is this a change from  baseline?: Pre-admission baseline Home Management (perform basic housework or laundry): Independent Manage your own finances?: (!) no (wife prepares finances) Primary transportation is: driving; family / friends Concerns about vision?: no *vision screening is required for WTM* Concerns about hearing?: (!) yes Uses hearing aids?: (!) yes  Fall Screening Falls in the past year?: 1 Number of falls in past year: 0 Was there an injury with Fall?: 0 Fall Risk Category Calculator: 1 Patient Fall Risk Level: Low Fall Risk  Fall Risk Patient at Risk for Falls Due to: History of fall(s); Impaired balance/gait; Impaired mobility Fall risk Follow up: Falls evaluation completed; Education provided  Home and Transportation Safety: All rugs have non-skid backing?: yes All stairs or steps have railings?: yes (also has a ramp) Grab bars in the bathtub or shower?: yes Have non-skid surface in bathtub or shower?: yes Good home lighting?: yes Regular seat belt use?: yes Hospital stays in the last year:: (!) yes How many hospital stays:: 3 Reason: fall, pneumonia and shoulder surgery  Cognitive Assessment Difficulty concentrating, remembering, or making decisions? : no Will 6CIT or Mini Cog be Completed: yes What year is it?: 0 points What month is it?: 0 points Give patient an address phrase to remember (5 components): [address] [place] About what time is it?: 0 points Count backwards from 20 to 1: 0 points Say the months of the year in reverse: 2 points (missed March) Repeat the address phrase from earlier: 0 points 6 CIT Score: 2 points  Advance Directives (For Healthcare) Does Patient Have a Medical Advance Directive?: Yes Does patient want to make changes to medical advance directive?: No - Patient declined Type of Advance Directive: Healthcare Power of [place]; Living will Copy of Healthcare Power of Attorney in Chart?: Yes - validated most recent copy scanned in chart (See row  information) Copy of Living  Will in Chart?: Yes - validated most recent copy scanned in chart (See row information) Would patient like information on creating a medical advance directive?: No - Patient declined  Reviewed/Updated  Reviewed/Updated: Reviewed All (Medical, Surgical, Family, Medications, Allergies, Care Teams, Patient Goals)    Allergies (verified) Quinolones and Meloxicam   Current Medications (verified) Outpatient Encounter Medications as of 06/27/2024  Medication Sig   acetaminophen  (TYLENOL ) 500 MG tablet Take 1,000 mg by mouth every 6 (six) hours as needed for mild pain (pain score 1-3).   amiodarone  (PACERONE ) 200 MG tablet Take 1 tablet (200 mg total) by mouth daily.   amLODipine  (NORVASC ) 10 MG tablet Take 1 tablet (10 mg total) by mouth daily.   cyanocobalamin  1000 MCG tablet Take 1 tablet (1,000 mcg total) by mouth daily.   DULoxetine  (CYMBALTA ) 20 MG capsule Take 1 capsule (20 mg total) by mouth at bedtime.   finasteride  (PROSCAR ) 5 MG tablet Take 5 mg by mouth daily.   lactulose  (CHRONULAC ) 10 GM/15ML solution Take 30 mLs (20 g total) by mouth 2 (two) times daily as needed for mild constipation.   levothyroxine  (SYNTHROID ) 150 MCG tablet Take 1 tablet (150 mcg total) by mouth daily before breakfast.   metoprolol  succinate (TOPROL -XL) 25 MG 24 hr tablet Take 1 tablet (25 mg total) by mouth 2 (two) times daily.   Multiple Vitamin (MULITIVITAMIN WITH MINERALS) TABS Take 1 tablet by mouth in the morning.   ondansetron  (ZOFRAN ) 4 MG tablet Take 1 tablet (4 mg total) by mouth every 6 (six) hours as needed for nausea.   pantoprazole  (PROTONIX ) 40 MG tablet Take 1 tablet (40 mg total) by mouth daily.   simvastatin  (ZOCOR ) 40 MG tablet Take 1 tablet (40 mg total) by mouth daily.   sodium bicarbonate  650 MG tablet Take 1 tablet (650 mg total) by mouth 3 (three) times daily.   tamsulosin  (FLOMAX ) 0.4 MG CAPS capsule Take 1 capsule (0.4 mg total) by mouth daily.   traMADol   (ULTRAM ) 50 MG tablet Take 1 tablet (50 mg total) by mouth every 12 (twelve) hours as needed for moderate pain (pain score 4-6).   triamcinolone  ointment (KENALOG ) 0.5 % Apply 1 Application topically 2 (two) times daily.   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   right forearm, [facility]   Stroke (HCC) 11/2007   R brain subcortical infarct   Past Surgical History:  Procedure Laterality Date   ASPIRATION / INJECTION RENAL CYST  07/08/2015   BACK SURGERY     approx 20- 25 years ago   COLONOSCOPY     DIALYSIS/PERMA CATHETER INSERTION Right 05/20/2024   Procedure: DIALYSIS/PERMA CATHETER INSERTION;  Surgeon: Marea Selinda RAMAN, MD;  Location: [facility] INVASIVE CV LAB;  Service: Cardiovascular;  Laterality: Right;   EYE SURGERY     cataract both eyes   GAS INSERTION  08/11/2011   Procedure: INSERTION OF GAS;  Surgeon: Norleen JONETTA Ku, MD;  Location: [facility] OR;  Service: Ophthalmology;  Laterality: Right;  [id number]   IR BONE MARROW BIOPSY & ASPIRATION  08/03/2023   IR BONE MARROW BIOPSY & ASPIRATION  06/18/2024   IR FLUORO GUIDE CV LINE RIGHT  10/23/2023   IR REMOVAL TUN CV CATH W/O FL  11/07/2023   IR THORACENTESIS RIGHT ASP PLEURAL SPACE W/IMG GUIDE  09/22/2023   IR THORACENTESIS RIGHT ASP PLEURAL SPACE  W/IMG GUIDE  10/02/2023   LEFT HEART CATH AND CORONARY ANGIOGRAPHY N/A 05/02/2023   Procedure: LEFT HEART CATH AND CORONARY ANGIOGRAPHY;  Surgeon: Mady Bruckner, MD;  Location: [facility] INVASIVE CV LAB;  Service: Cardiovascular;  Laterality: N/A;   REVERSE SHOULDER ARTHROPLASTY Right 04/13/2023   Procedure: REVERSE SHOULDER ARTHROPLASTY;  Surgeon: Edie Norleen PARAS, MD;  Location: [facility] ORS;  Service: Orthopedics;  Laterality: Right;   SCLERAL BUCKLE  08/11/2011   Procedure: SCLERAL BUCKLE;  Surgeon: Norleen JONETTA Ku, MD;  Location[facility];  Service: Ophthalmology;  Laterality: Right;   VARICOSE VEIN SURGERY     Family History  Problem Relation Age of Onset   Heart disease Father    Dementia Sister    Diabetes Son    Kidney disease Neg Hx    Prostate cancer Neg Hx    Social History   Occupational History   Occupation: Fish Farm Manager: OTHER    Comment: community   Occupation: SELF EMPLOYED    Employer: SELF EMPLOYED  Tobacco Use   Smoking status: Former    Current packs/day: 0.00    Average packs/day: 0.3 packs/day    Types: Cigarettes    Quit date: 06/06/2010    Years since quitting: 14.0   Smokeless tobacco: Never  Vaping Use   Vaping status: Never Used  Substance and Sexual Activity   Alcohol use: No    Alcohol/week: 0.0 standard drinks of alcohol   Drug use: No   Sexual activity: Not Currently   Tobacco Counseling Counseling given: Not Answered  SDOH Screenings   Food Insecurity: No Food Insecurity (06/27/2024)  Housing: Low Risk (06/27/2024)  Transportation Needs: No Transportation Needs (06/27/2024)  Utilities: Not At Risk (06/27/2024)  Alcohol Screen: Low Risk (07/12/2021)  Depression (PHQ2-9): Low Risk (06/27/2024)  Financial Resource Strain: Low Risk  (05/14/2024)   Received from [facility]  Physical Activity: Inactive (06/27/2024)  Social Connections: Moderately Integrated (06/27/2024)  Stress: No Stress Concern Present (06/27/2024)  Tobacco Use:  Medium Risk (06/27/2024)  Health Literacy: Adequate Health Literacy (06/27/2024)   See flowsheets for full screening details  Depression Screen PHQ 2 & 9 Depression Scale- Over the past 2 weeks, how often have you been bothered by any of the following problems? Little interest or pleasure in doing things: 0 Feeling down, depressed, or hopeless (PHQ Adolescent also includes...irritable): 0 PHQ-2 Total Score: 0 Trouble falling or staying asleep, or sleeping too much: 1 Feeling tired or having little energy: 1 Poor appetite or overeating (PHQ Adolescent also includes...weight loss): 0 Feeling bad about yourself - or that you are a failure or have let yourself or your family down: 0 Trouble concentrating on things, such as reading the newspaper or watching television (PHQ Adolescent also includes...like school work): 0 Moving or speaking so slowly that other people could have noticed. Or the opposite -  being so fidgety or restless that you have been moving around a lot more than usual: 0 Thoughts that you would be better off dead, or of hurting yourself in some way: 0 PHQ-9 Total Score: 2 If you checked off any problems, how difficult have these problems made it for you to do your work, take care of things at home, or get along with other people?: Not difficult at all  Depression Treatment Depression Interventions/Treatment : EYV7-0 Score <4 Follow-up Not Indicated; Currently on Treatment     Goals Addressed               This Visit's Progress     increase mobility (pt-stated)               Objective:    Today's Vitals   06/27/24 1002  Weight: 160 lb (72.6 kg)  Height: 5' 10 (1.778 m)   Body mass index is 22.96 kg/m.  Hearing/Vision screen Hearing Screening - Comments:: Wears hearing aids Vision Screening - Comments:: UTD w/ [facility] Immunizations and Health Maintenance Health Maintenance  Topic Date Due   Zoster Vaccines- Shingrix (1 of 2)  07/21/1959   COVID-19 Vaccine (3 - Pfizer risk series) 09/19/2019   DTaP/Tdap/Td (2 - Tdap) 10/12/2026   Pneumococcal Vaccine: 50+ Years  Completed   Influenza Vaccine  Completed   Meningococcal B Vaccine  Aged Out   Hepatitis B Vaccines 19-59 Average Risk  Discontinued   Hepatitis C Screening  Discontinued        Assessment/Plan:  This is a routine wellness examination for [place].  Patient Care Team: Vicci Duwaine SQUIBB, DO as PCP - General (Family Medicine) Perla Evalene PARAS, MD as Consulting Physician (Cardiology) Melanee Annah BROCKS, MD as Consulting Physician (Oncology) Willma Camelia CROME, RN (Inactive) as VBCI Care Management Mertel, [place] , RN as VBCI Care Management Hooten, Lynwood SQUIBB, MD as Consulting Physician (Orthopedic Surgery) Dennise Capri, MD as Consulting Physician (Nephrology) Neill Boas, DPM (Podiatry) Effie Evalene PARAS, DC as Referring Physician (Chiropractic Medicine) Pa, [facility])  I have personally reviewed and noted the following in the patients chart:   Medical and social history Use of alcohol, tobacco or illicit drugs  Current medications and supplements including opioid prescriptions. Functional ability and status Nutritional status Physical activity Advanced directives List of other physicians Hospitalizations, surgeries, and ER visits in previous 12 months Vitals Screenings to include cognitive, depression, and falls Referrals and appointments  No orders of the defined types were placed in this encounter.  In addition, I have reviewed and discussed with patient certain preventive protocols, quality metrics, and best practice recommendations. A written personalized care plan for preventive services as well as general preventive health recommendations were provided to patient.   Vina Ned, CMA   06/27/2024   Return in 53 weeks (on 07/03/2025) for Medicare Annual Wellness Visit.  After Visit Summary: (Mail) Due to this being a  telephonic visit, the after visit summary with patients personalized plan was offered to patient via mail   Nurse Notes:  Wife, [name] and daughter, [name] were present during today's visit 6 CIT Score - 2 Rescheduled 05/29/24 OV (No Show) to 07/22/24 Plans to get shingles vaccine (pharmacy) Declined Covid vaccine Screening colonoscopy no longer recommended due to age. "

## 2024-06-27 NOTE — Telephone Encounter (Addendum)
 Per Dr. Melanee can you check with pathology if they were able to run myelod mutation panel on this patient? 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 Also lets plan to check peripheral blood intellegin myeloid panel with next set of labs.  Spoke to [facility] in [place] and requested to add ngs myeloid to bone marrow biopsy done yesterday 06/18/24.  Outbound call to [facility] Flow lab to check status of bone marrow, spoke to [place] still processing 40% ETA 2/3 (patient's next appointment is 2/4).

## 2024-06-28 ENCOUNTER — Telehealth: Payer: Self-pay

## 2024-06-28 ENCOUNTER — Other Ambulatory Visit: Payer: Self-pay

## 2024-06-28 NOTE — Telephone Encounter (Signed)
 Copied from CRM #[id number]. Topic: Clinical - Medical Advice >> Jun 28, 2024  8:06 AM Tiffini S wrote: Reason for CRM: Andrea Mose OT [phone] with [facility] called to report a missed visit on 06/19/2024- never spoke with the patient- family member answered the phone twice and hung up both times next visit is scheduled for today with supervisor

## 2024-06-30 ENCOUNTER — Encounter (INDEPENDENT_AMBULATORY_CARE_PROVIDER_SITE_OTHER): Payer: Self-pay | Admitting: Nurse Practitioner

## 2024-06-30 NOTE — Progress Notes (Signed)
 "  Subjective:    Patient ID: [name], male    DOB: June 09, 1940, 84 y.o.   MRN: [id number] Chief Complaint  Patient presents with   Follow-up    np. UE V-Map/UE Art Duplex + consult. Chronic Kidney Disease-stage 5  ref: Singh,Harmeet     HPI  Discussed the use of AI scribe software for clinical note transcription with the patient, who gave verbal consent to proceed.  History of Present Illness [name] is an 84 year old male with end-stage renal disease on hemodialysis who presents for evaluation of permanent left upper extremity dialysis access placement.  He is currently undergoing hemodialysis via a temporary central venous catheter in the chest, with sessions on Tuesdays and Saturdays. He expresses reluctance regarding ongoing dialysis but acknowledges the necessity as advised by nephrology. Options discussed for permanent dialysis access include arteriovenous graft versus fistula in the left arm due to small veins. He inquires about graft size and location; the procedure was explained as a day surgery involving two small incisions and placement of a subcutaneous conduit approximately the thickness of a finger.  He also describes longstanding bilateral lower extremity weakness for nearly one year, resulting in significant difficulty with ambulation and persistent decreased strength. He was unable to walk for several days following recent hospitalizations and continues to have mobility challenges, though he remains able to independently don socks and shoes. He is currently participating in physical therapy, which has provided exercises but no definitive diagnosis. He notes recent onset of sciatic-type discomfort in one leg following a bone scan last week, but denies significant ongoing back pain. He denies symptoms of arterial insufficiency, including claudication or rest pain.    Results Radiology CT angiography abdomen: Atherosclerotic plaque in arteries without  significant stenosis (Independently interpreted)   Review of Systems  Cardiovascular:  Negative for leg swelling.  Neurological:  Positive for weakness.  All other systems reviewed and are negative.      Objective:   Physical Exam Vitals reviewed.  HENT:     Head: Normocephalic.  Cardiovascular:     Rate and Rhythm: Normal rate.     Pulses:          Radial pulses are 2+ on the right side and 2+ on the left side.       Dorsalis pedis pulses are 2+ on the right side and 2+ on the left side.       Posterior tibial pulses are 2+ on the right side and 2+ on the left side.  Pulmonary:     Effort: Pulmonary effort is normal.  Skin:    General: Skin is warm and dry.  Neurological:     Mental Status: He is alert and oriented to person, place, and time.     Motor: Weakness present.     Gait: Gait abnormal.  Psychiatric:        Mood and Affect: Mood normal.        Behavior: Behavior normal.        Thought Content: Thought content normal.        Judgment: Judgment normal.     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 approximately 10-15 years ago   Myelofibrosis (HCC)    Prostate hypertrophy    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   right forearm, [facility]   Stroke (HCC) 11/2007   R brain subcortical infarct    Social History   Socioeconomic History   Marital status: Married    Spouse name: Maceo    Number of children: 2   Years of  education: 12+   Highest education level: Some college, no degree  Occupational History   Occupation: Fish Farm Manager: OTHER    Comment: community   Occupation: SELF EMPLOYED    Employer: SELF EMPLOYED  Tobacco Use   Smoking status: Former    Current packs/day: 0.00    Average packs/day: 0.3 packs/day    Types: Cigarettes    Quit date: 06/06/2010    Years since quitting: 14.0   Smokeless tobacco: Never  Vaping Use   Vaping status: Never Used  Substance and Sexual Activity   Alcohol use: No    Alcohol/week: 0.0 standard drinks of alcohol   Drug use: No   Sexual activity: Not Currently  Other Topics Concern   Not on file  Social History Narrative   Pt lives at home with his family.   Caffeine Use- 2 cups daily   Patient has 2 children.    Patient has some college.    Patient is right handed.          Works part time   Social Drivers of Health   Tobacco Use: Medium Risk (06/30/2024)   Patient History    Smoking Tobacco Use: Former    Smokeless Tobacco Use: Never    Passive Exposure: Not on file  Financial Resource Strain: Low Risk  (05/14/2024)   Received from [facility]   Overall Financial Resource Strain (CARDIA)    Difficulty of Paying Living Expenses: Not hard at all  Food Insecurity: No Food Insecurity (06/27/2024)   Epic    Worried About Radiation Protection Practitioner of Food in the Last Year: Never true    Ran Out of Food in the Last Year: Never true  Transportation Needs: No Transportation Needs (06/27/2024)   Epic    Lack of Transportation (Medical): No    Lack of Transportation (Non-Medical): No  Physical Activity: Inactive (06/27/2024)   Exercise Vital Sign    Days of Exercise per Week: 0 days    Minutes of Exercise per Session: 0 min  Stress: No Stress Concern Present (06/27/2024)   [employer] of Occupational Health - Occupational Stress Questionnaire    Feeling of Stress: Not at all  Social Connections: Moderately Integrated (06/27/2024)    Social Connection and Isolation Panel    Frequency of Communication with Friends and Family: More than three times a week    Frequency of Social Gatherings with Friends and Family: More than three times a week    Attends Religious Services: More than 4 times per year    Active Member of [employer] or Organizations: No    Attends Banker Meetings: Never    Marital Status: Married  Catering Manager Violence: Not At Risk (06/27/2024)  Epic    Fear of Current or Ex-Partner: No    Emotionally Abused: No    Physically Abused: No    Sexually Abused: No  Depression (PHQ2-9): Low Risk (06/27/2024)   Depression (PHQ2-9)    PHQ-2 Score: 2  Alcohol Screen: Low Risk (07/12/2021)   Alcohol Screen    Last Alcohol Screening Score (AUDIT): 0  Housing: Low Risk (06/27/2024)   Epic    Unable to Pay for Housing in the Last Year: No    Number of Times Moved in the Last Year: 0    Homeless in the Last Year: No  Utilities: Not At Risk (06/27/2024)   Epic    Threatened with loss of utilities: No  Health Literacy: Adequate Health Literacy (06/27/2024)   B1300 Health Literacy    Frequency of need for help with medical instructions: Never    Past Surgical History:  Procedure Laterality Date   ASPIRATION / INJECTION RENAL CYST  07/08/2015   BACK SURGERY     approx 20- 25 years ago   COLONOSCOPY     DIALYSIS/PERMA CATHETER INSERTION Right 05/20/2024   Procedure: DIALYSIS/PERMA CATHETER INSERTION;  Surgeon: Marea Selinda RAMAN, MD;  Location: [facility] INVASIVE CV LAB;  Service: Cardiovascular;  Laterality: Right;   EYE SURGERY     cataract both eyes   GAS INSERTION  08/11/2011   Procedure: INSERTION OF GAS;  Surgeon: Norleen JONETTA Ku, MD;  Location: [facility] OR;  Service: Ophthalmology;  Laterality: Right;  [id number]   IR BONE MARROW BIOPSY & ASPIRATION  08/03/2023   IR BONE MARROW BIOPSY & ASPIRATION  06/18/2024   IR FLUORO GUIDE CV LINE RIGHT  10/23/2023   IR REMOVAL TUN CV CATH W/O FL  11/07/2023   IR THORACENTESIS  RIGHT ASP PLEURAL SPACE W/IMG GUIDE  09/22/2023   IR THORACENTESIS RIGHT ASP PLEURAL SPACE W/IMG GUIDE  10/02/2023   LEFT HEART CATH AND CORONARY ANGIOGRAPHY N/A 05/02/2023   Procedure: LEFT HEART CATH AND CORONARY ANGIOGRAPHY;  Surgeon: Mady Bruckner, MD;  Location: [facility] INVASIVE CV LAB;  Service: Cardiovascular;  Laterality: N/A;   REVERSE SHOULDER ARTHROPLASTY Right 04/13/2023   Procedure: REVERSE SHOULDER ARTHROPLASTY;  Surgeon: Edie Norleen PARAS, MD;  Location: [facility] ORS;  Service: Orthopedics;  Laterality: Right;   SCLERAL BUCKLE  08/11/2011   Procedure: SCLERAL BUCKLE;  Surgeon: Norleen JONETTA Ku, MD;  Location: [facility] OR;  Service: Ophthalmology;  Laterality: Right;   VARICOSE VEIN SURGERY      Family History  Problem Relation Age of Onset   Heart disease Father    Dementia Sister    Diabetes Son    Kidney disease Neg Hx    Prostate cancer Neg Hx     Allergies[1]     Latest Ref Rng & Units 06/19/2024    9:36 AM 06/18/2024    8:49 AM 06/05/2024   10:55 AM  CBC  WBC 4.0 - 10.5 K/uL 10.8  11.6  19.8   Hemoglobin 13.0 - 17.0 g/dL 7.0  7.0  7.2   Hematocrit 39.0 - 52.0 % 22.3  22.4  22.6   Platelets 150 - 400 K/uL 273  295  345       CMP     Component Value Date/Time   NA 139 06/05/2024 1055   NA 138 04/10/2024 1201   NA 141 06/18/2014 1015   K 4.6 06/05/2024 1055   K 4.5 06/18/2014 1015   CL 101 06/05/2024 1055   CL 107 06/18/2014 1015   CO2  26 06/05/2024 1055   CO2 26 06/18/2014 1015   GLUCOSE 97 06/05/2024 1055   GLUCOSE 160 (H) 06/18/2014 1015   BUN 29 (H) 06/05/2024 1055   BUN 62 (H) 04/10/2024 1201   BUN 20 (H) 06/18/2014 1015   CREATININE 3.55 (H) 06/05/2024 1055   CREATININE 1.09 06/18/2014 1015   CALCIUM  8.1 (L) 06/05/2024 1055   CALCIUM  8.5 06/18/2014 1015   PROT 6.0 (L) 06/05/2024 1055   PROT 6.8 04/10/2024 1201   PROT 6.6 06/18/2014 1015   ALBUMIN  3.6 06/05/2024 1055   ALBUMIN  4.3 04/10/2024 1201   ALBUMIN  3.9 06/18/2014 1015   AST 29 06/05/2024 1055    ALT 19 06/05/2024 1055   ALT 43 06/18/2014 1015   ALKPHOS 146 (H) 06/05/2024 1055   ALKPHOS 118 (H) 06/18/2014 1015   BILITOT 0.6 06/05/2024 1055   EGFR 13 (L) 04/10/2024 1201   GFRNONAA 16 (L) 06/05/2024 1055   GFRNONAA >60 06/18/2014 1015     No results found.     Assessment & Plan:   1. ESRD on dialysis [facility]) (Primary) Vascular access planning for hemodialysis in end stage renal disease Dialysis-dependent with temporary chest catheter. Small-caliber veins necessitate graft for permanent access to reduce infection risk and ensure reliable hemodialysis. - Scheduled outpatient left arm dialysis brach ax av graft placement. - Explained procedure involves two small incisions, performed as ambulatory surgery with same-day discharge. - Informed graft requires four weeks to mature before use. - Advised continued use of temporary chest catheter until graft maturation. - Planned follow-up visit postoperatively to assess graft function.  2. Primary hypertension Continue antihypertensive medications as already ordered, these medications have been reviewed and there are no changes at this time.   Assessment and Plan Assessment & Plan      Medications Ordered Prior to Encounter[2]  There are no Patient Instructions on file for this visit. No follow-ups on file.   Tovah Slavick E Fernado Brigante, NP      [1]  Allergies Allergen Reactions   Quinolones Other (See Comments)    Patient with aortic aneurysm. Use of quinolones are considered to be contraindicated due to risk of aortic rupture.   Meloxicam Nausea And Vomiting  [2]  Current Outpatient Medications on File Prior to Visit  Medication Sig Dispense Refill   acetaminophen  (TYLENOL ) 500 MG tablet Take 1,000 mg by mouth every 6 (six) hours as needed for mild pain (pain score 1-3).     amiodarone  (PACERONE ) 200 MG tablet Take 1 tablet (200 mg total) by mouth daily. 30 tablet 0   amLODipine  (NORVASC ) 10 MG tablet Take 1 tablet (10 mg  total) by mouth daily. 30 tablet 0   cyanocobalamin  1000 MCG tablet Take 1 tablet (1,000 mcg total) by mouth daily. 30 tablet 0   DULoxetine  (CYMBALTA ) 20 MG capsule Take 1 capsule (20 mg total) by mouth at bedtime. 90 capsule 1   finasteride  (PROSCAR ) 5 MG tablet Take 5 mg by mouth daily.     lactulose  (CHRONULAC ) 10 GM/15ML solution Take 30 mLs (20 g total) by mouth 2 (two) times daily as needed for mild constipation. 236 mL 0   levothyroxine  (SYNTHROID ) 150 MCG tablet Take 1 tablet (150 mcg total) by mouth daily before breakfast. 90 tablet 0   metoprolol  succinate (TOPROL -XL) 25 MG 24 hr tablet Take 1 tablet (25 mg total) by mouth 2 (two) times daily. 180 tablet 1   Multiple Vitamin (MULITIVITAMIN WITH MINERALS) TABS Take 1 tablet by mouth in the morning.  ondansetron  (ZOFRAN ) 4 MG tablet Take 1 tablet (4 mg total) by mouth every 6 (six) hours as needed for nausea. 20 tablet 0   pantoprazole  (PROTONIX ) 40 MG tablet Take 1 tablet (40 mg total) by mouth daily. 90 tablet 1   simvastatin  (ZOCOR ) 40 MG tablet Take 1 tablet (40 mg total) by mouth daily. 90 tablet 1   sodium bicarbonate  650 MG tablet Take 1 tablet (650 mg total) by mouth 3 (three) times daily.     tamsulosin  (FLOMAX ) 0.4 MG CAPS capsule Take 1 capsule (0.4 mg total) by mouth daily. 90 capsule 1   traMADol  (ULTRAM ) 50 MG tablet Take 1 tablet (50 mg total) by mouth every 12 (twelve) hours as needed for moderate pain (pain score 4-6). 30 tablet 0   triamcinolone  ointment (KENALOG ) 0.5 % Apply 1 Application topically 2 (two) times daily. 30 g 0   Current Facility-Administered Medications on File Prior to Visit  Medication Dose Route Frequency Provider Last Rate Last Admin   epoetin  alfa-epbx (RETACRIT ) injection 10,000 Units  10,000 Units Subcutaneous Once Rao, Archana C, MD       epoetin  alfa-epbx (RETACRIT ) injection 40,000 Units  40,000 Units Subcutaneous Once Rao, Archana C, MD       "

## 2024-07-01 ENCOUNTER — Other Ambulatory Visit (HOSPITAL_COMMUNITY): Payer: Self-pay

## 2024-07-03 ENCOUNTER — Telehealth: Payer: Self-pay | Admitting: Pharmacy Technician

## 2024-07-03 NOTE — Telephone Encounter (Signed)
 Outbound call, spoke to Hana who indicated it will be five more days until it results.  Patient's next appointment is 2/4; will call back on 2/3

## 2024-07-03 NOTE — Telephone Encounter (Signed)
 Oral Oncology Patient Advocate Encounter   Began application for assistance for [name] through [employer].   Application will be submitted upon completion of necessary supporting documentation.   [employer] phone number [phone].   Patient + his wife stated they will wait until his appointment on 07/10/2024 to sign the application and bring in income information.   Airis Barbee (Patty) Chet Burnet, CPhT  [facility], [place], [facility] - Oral Chemotherapy Patient Advocate Specialist III Phone: [phone]  Fax: [phone]

## 2024-07-04 ENCOUNTER — Other Ambulatory Visit (HOSPITAL_COMMUNITY): Payer: Self-pay

## 2024-07-08 ENCOUNTER — Telehealth (INDEPENDENT_AMBULATORY_CARE_PROVIDER_SITE_OTHER): Payer: Self-pay

## 2024-07-08 NOTE — Telephone Encounter (Signed)
 Spoke with the patient's spouse and he is scheduled with Dr. Marea for a left brachial axillary graft on 07/25/24 at the [facility]. Pre- admit will call to schedule pre-op  at the [facility]. Pre-surgical instructions were discussed and will be sent to Mychart and mailed.

## 2024-07-09 NOTE — Telephone Encounter (Signed)
 Spoke to [place] who indicated the cytogenetics are back but they're still waiting on the DNA only on the myeloid ngs to result, says it will be back in 14 hours. Patient's next appointment is tomorrow 2/4 @ 9:30am.  Alan said to give her a call tomorrow and she will get the results either scanned in or emailed to me.

## 2024-07-10 ENCOUNTER — Other Ambulatory Visit: Payer: Self-pay

## 2024-07-10 ENCOUNTER — Inpatient Hospital Stay: Attending: Oncology

## 2024-07-10 ENCOUNTER — Inpatient Hospital Stay

## 2024-07-10 ENCOUNTER — Encounter (HOSPITAL_COMMUNITY): Payer: Self-pay

## 2024-07-10 ENCOUNTER — Other Ambulatory Visit (HOSPITAL_COMMUNITY): Payer: Self-pay

## 2024-07-10 ENCOUNTER — Encounter: Payer: Self-pay | Admitting: Oncology

## 2024-07-10 ENCOUNTER — Inpatient Hospital Stay: Admitting: Oncology

## 2024-07-10 VITALS — BP 97/58 | HR 80 | Temp 97.8°F | Resp 16 | Wt 158.0 lb

## 2024-07-10 DIAGNOSIS — D469 Myelodysplastic syndrome, unspecified: Secondary | ICD-10-CM

## 2024-07-10 DIAGNOSIS — D649 Anemia, unspecified: Secondary | ICD-10-CM

## 2024-07-10 DIAGNOSIS — Z5111 Encounter for antineoplastic chemotherapy: Secondary | ICD-10-CM

## 2024-07-10 DIAGNOSIS — N183 Chronic kidney disease, stage 3 unspecified: Secondary | ICD-10-CM

## 2024-07-10 LAB — CBC WITH DIFFERENTIAL (CANCER CENTER ONLY)
Abs Immature Granulocytes: 0.74 10*3/uL — ABNORMAL HIGH (ref 0.00–0.07)
Basophils Absolute: 0.1 10*3/uL (ref 0.0–0.1)
Basophils Relative: 1 %
Eosinophils Absolute: 0 10*3/uL (ref 0.0–0.5)
Eosinophils Relative: 0 %
HCT: 19.9 % — ABNORMAL LOW (ref 39.0–52.0)
Hemoglobin: 6.1 g/dL — CL (ref 13.0–17.0)
Immature Granulocytes: 8 %
Lymphocytes Relative: 17 %
Lymphs Abs: 1.5 10*3/uL (ref 0.7–4.0)
MCH: 26.9 pg (ref 26.0–34.0)
MCHC: 30.7 g/dL (ref 30.0–36.0)
MCV: 87.7 fL (ref 80.0–100.0)
Monocytes Absolute: 1.3 10*3/uL — ABNORMAL HIGH (ref 0.1–1.0)
Monocytes Relative: 14 %
Neutro Abs: 5.6 10*3/uL (ref 1.7–7.7)
Neutrophils Relative %: 60 %
Platelet Count: 296 10*3/uL (ref 150–400)
RBC: 2.27 MIL/uL — ABNORMAL LOW (ref 4.22–5.81)
RDW: 23.3 % — ABNORMAL HIGH (ref 11.5–15.5)
Smear Review: NORMAL
WBC Count: 9.3 10*3/uL (ref 4.0–10.5)
nRBC: 3 % — ABNORMAL HIGH (ref 0.0–0.2)

## 2024-07-10 LAB — PREPARE RBC (CROSSMATCH)

## 2024-07-10 LAB — CMP (CANCER CENTER ONLY)
ALT: 26 U/L (ref 0–44)
AST: 31 U/L (ref 15–41)
Albumin: 4.1 g/dL (ref 3.5–5.0)
Alkaline Phosphatase: 141 U/L — ABNORMAL HIGH (ref 38–126)
Anion gap: 16 — ABNORMAL HIGH (ref 5–15)
BUN: 34 mg/dL — ABNORMAL HIGH (ref 8–23)
CO2: 23 mmol/L (ref 22–32)
Calcium: 8.8 mg/dL — ABNORMAL LOW (ref 8.9–10.3)
Chloride: 104 mmol/L (ref 98–111)
Creatinine: 2.91 mg/dL — ABNORMAL HIGH (ref 0.61–1.24)
GFR, Estimated: 21 mL/min — ABNORMAL LOW
Glucose, Bld: 123 mg/dL — ABNORMAL HIGH (ref 70–99)
Potassium: 4.8 mmol/L (ref 3.5–5.1)
Sodium: 143 mmol/L (ref 135–145)
Total Bilirubin: 0.7 mg/dL (ref 0.0–1.2)
Total Protein: 6.3 g/dL — ABNORMAL LOW (ref 6.5–8.1)

## 2024-07-10 LAB — SURGICAL PATHOLOGY

## 2024-07-10 LAB — LACTATE DEHYDROGENASE: LDH: 623 U/L — ABNORMAL HIGH (ref 105–235)

## 2024-07-10 MED ORDER — LUSPATERCEPT-AAMT 75 MG ~~LOC~~ SOLR
125.0000 mg | Freq: Once | SUBCUTANEOUS | Status: AC
  Administered 2024-07-10: 125 mg via SUBCUTANEOUS
  Filled 2024-07-10: qty 1.5

## 2024-07-10 NOTE — Telephone Encounter (Signed)
 Nat said it karyotupe no metaphase cells are available for interpretation QNS.  That's probably what their working on now, the DNA only.

## 2024-07-10 NOTE — Telephone Encounter (Signed)
 Oral Oncology Patient Advocate Encounter  Patient's wife called me back to let me know that they are above the income limit for PAP.  Patient + his wife stated that they would prefer to sign up for the Medicare Prescription Payment Plan.  I advised patient + his wife to call me back as soon as the patient has signed up or if they happen to have any questions or concerns.  Mccartney Brucks (Patty) Chet Burnet, CPhT  [facility], [place], [facility] - Oral Chemotherapy Pharmacy Technician III Certified Patient Advocate Phone: [phone]  Fax: [phone]

## 2024-07-10 NOTE — Progress Notes (Signed)
 Spoke to [employer] in lab, critical alert value 6.1 (read back completed).

## 2024-07-10 NOTE — Telephone Encounter (Signed)
 Incoming call from [place] who indicated that it is with pathologist for interpreteration, will try to put a rush on it, uncertain if maybe today or tomorrow. Requested to email me everything that has resulted thus far; provided email address.

## 2024-07-10 NOTE — Progress Notes (Signed)
 Pt in for follow up and injection. Pt reports he started dialysis 3 weeks twice a week on Tues and Saturday.

## 2024-07-10 NOTE — Telephone Encounter (Signed)
 Outbound call; spoke to [place] who indicated it's 70% done. Nat will call pathologist and give me a call back.

## 2024-07-10 NOTE — Telephone Encounter (Signed)
 Per Patty's discussion with patient's wife, it does not sound like patient will qualify for assistance based off of income.  Patient plans to sign up for Medicare prescription plan which will spread the cost of the medication by his insurance through the rest of the year.  Dr. Melanee will let us  know when/if you want to proceed with the Momelotinib for the patient and we will get him set up from the pharmacy for delivery at that time.  Patty, I will discontinue the order for Momelotinib for now and reenter it when it is time to proceed so it is not hanging out in your queue.

## 2024-07-10 NOTE — Progress Notes (Signed)
 "    Hematology/Oncology Consult note [facility]  Telephone:([phone] [phone]  Patient Care Team: Vicci Duwaine SQUIBB, DO as PCP - General (Family Medicine) Perla Evalene PARAS, MD as Consulting Physician (Cardiology) Melanee Annah BROCKS, MD as Consulting Physician (Oncology) Willma Camelia CROME, RN (Inactive) as VBCI Care Management Mertel, [place] , RN as VBCI Care Management Hooten, Lynwood SQUIBB, MD as Consulting Physician (Orthopedic Surgery) Dennise Capri, MD as Consulting Physician (Nephrology) Neill Boas, DPM (Podiatry) Effie Evalene PARAS, DC as Referring Physician (Chiropractic Medicine) Pa, [facility])   Name of the patient: [name]  [id number]  [birth year]   Date of visit: 07/10/24  Diagnosis-primary myelofibrosis  Chief complaint/ Reason for visit-routine 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  At that time he was found to have a mild splenomegaly of 15.8 cm.DIPPS score is 3 (age 67- 1, hemoglobin less than 10- 2) and score of 4 if 1% circulating blasts included from 07/17/2014.   Bone marrow on 06/11/2014 was most consistent with primary myelofibrosis.  Bone marrow biopsy showed 1% abnormal cells: CD45+, CD5+, CD10, CD11c+/-, CD19+, CD2-+, (dim), CD22+ (dim, CD23+, CD38-/+, FMC7-, HLA-DR+, sig lambda+(dim).  Blasts were not increased 1.2%; hypercellular for age: 28%; JAK2 V617F mutation was negative.  CALR mutation positive.  Flow cytometry included about 1% CLL/SLL phenotype cells (CD5+) of uncertain significance and some infiltrate into the marrow with increased atypical megakaryocytes.  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  MDS FISH panel was negative.   Patient used to follow up with [facility] hematology Dr. Margart Ruddy for his anemia.  Patient's  hemoglobin was drifting down to the eights and was started on EPO in October 2022.   Patient has had worsening anemia requiring intermittent blood transfusions despite receiving Retacrit  since October 2024.  PatientUnderwent a repeat bone marrow biopsy in February 2025.  Findings were consistent with myelofibrosis with evidence of possible dysplasia and increased ringed sideroblasts and questionable myeloid dysgranulopoiesis.  Cytogenetic studies could not be completed due to no metaphase cells available for analysis.  [employer] profile was also not completed due to limited specimen and poor quality DNA in the specimen.  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  No recent hospitalizations.  Tolerating luspatercept  and EPO well presently.  ECOG PS- 3 Pain scale- 0   Review of systems- Review of Systems  Constitutional:  Positive for malaise/fatigue. Negative for chills, fever and weight loss.  HENT:  Negative for congestion, ear discharge and nosebleeds.   Eyes:  Negative for blurred vision.  Respiratory:  Negative for cough, hemoptysis, sputum production, shortness of breath and wheezing.   Cardiovascular:  Negative for chest pain, palpitations, orthopnea and claudication.  Gastrointestinal:  Negative for abdominal pain, blood in stool, constipation, diarrhea, heartburn, melena, nausea and vomiting.  Genitourinary:  Negative for dysuria, flank pain, frequency, hematuria and urgency.  Musculoskeletal:  Negative for back pain, joint pain and myalgias.  Skin:  Negative for rash.  Neurological:  Negative for dizziness, tingling, focal weakness, seizures, weakness and headaches.  Endo/Heme/Allergies:  Does not bruise/bleed easily.  Psychiatric/Behavioral:  Negative for depression and  suicidal ideas.  The patient does not have insomnia.       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   right forearm, [facility]   Stroke (HCC) 11/2007   R brain subcortical infarct     Past Surgical History:  Procedure Laterality Date   ASPIRATION / INJECTION RENAL CYST  07/08/2015   BACK SURGERY     approx 20- 25 years ago   COLONOSCOPY     DIALYSIS/PERMA CATHETER INSERTION Right 05/20/2024   Procedure: DIALYSIS/PERMA CATHETER INSERTION;  Surgeon: Marea Selinda RAMAN, MD;  Location: [facility] INVASIVE CV LAB;  Service: Cardiovascular;  Laterality: Right;   EYE SURGERY     cataract both eyes   GAS INSERTION  08/11/2011   Procedure: INSERTION OF GAS;  Surgeon: Norleen JONETTA Ku, MD;  Location: [facility] OR;  Service: Ophthalmology;  Laterality: Right;  C3F8   IR BONE MARROW BIOPSY & ASPIRATION  08/03/2023   IR BONE MARROW BIOPSY & ASPIRATION  06/18/2024   IR FLUORO GUIDE CV LINE RIGHT  10/23/2023   IR REMOVAL TUN CV CATH W/O FL  11/07/2023   IR THORACENTESIS RIGHT ASP PLEURAL SPACE W/IMG GUIDE  09/22/2023   IR THORACENTESIS RIGHT ASP PLEURAL SPACE W/IMG GUIDE  10/02/2023   LEFT HEART CATH AND CORONARY ANGIOGRAPHY N/A 05/02/2023   Procedure: LEFT HEART CATH AND CORONARY ANGIOGRAPHY;  Surgeon: Mady Bruckner, MD;  Location: [facility] INVASIVE CV LAB;  Service: Cardiovascular;  Laterality: N/A;   REVERSE SHOULDER ARTHROPLASTY Right 04/13/2023   Procedure: REVERSE SHOULDER ARTHROPLASTY;   Surgeon: Edie Norleen PARAS, MD;  Location: [facility] ORS;  Service: Orthopedics;  Laterality: Right;   SCLERAL BUCKLE  08/11/2011   Procedure: SCLERAL BUCKLE;  Surgeon: Norleen JONETTA Ku, MD;  Location: [facility] OR;  Service: Ophthalmology;  Laterality: Right;   VARICOSE VEIN SURGERY      Social History   Socioeconomic History   Marital status: Married    Spouse name: Maceo    Number of children: 2   Years of education: 12+   Highest education level: Some college, no degree  Occupational History   Occupation: Fish Farm Manager: OTHER    Comment: community   Occupation: SELF EMPLOYED    Employer: SELF EMPLOYED  Tobacco Use   Smoking status: Former    Current packs/day: 0.00    Average packs/day: 0.3 packs/day    Types: Cigarettes    Quit date: 06/06/2010    Years since quitting: 14.1   Smokeless tobacco: Never  Vaping Use   Vaping status: Never Used  Substance and Sexual Activity   Alcohol use: No    Alcohol/week: 0.0 standard drinks of alcohol   Drug use: No   Sexual activity: Not Currently  Other Topics Concern   Not on file  Social History Narrative   Pt lives at home with his family.   Caffeine Use- 2 cups daily  Patient has 2 children.    Patient has some college.    Patient is right handed.          Works part time   Social Drivers of Health   Tobacco Use: Medium Risk (07/10/2024)   Patient History    Smoking Tobacco Use: Former    Smokeless Tobacco Use: Never    Passive Exposure: Not on file  Financial Resource Strain: Low Risk  (05/14/2024)   Received from [facility]   Overall Financial Resource Strain (CARDIA)    Difficulty of Paying Living Expenses: Not hard at all  Food Insecurity: No Food Insecurity (06/27/2024)   Epic    Worried About Radiation Protection Practitioner of Food in the Last Year: Never true    Ran Out of Food in the Last Year: Never true  Transportation Needs: No Transportation Needs (06/27/2024)   Epic    Lack of Transportation (Medical): No    Lack  of Transportation (Non-Medical): No  Physical Activity: Inactive (06/27/2024)   Exercise Vital Sign    Days of Exercise per Week: 0 days    Minutes of Exercise per Session: 0 min  Stress: No Stress Concern Present (06/27/2024)   [employer] of Occupational Health - Occupational Stress Questionnaire    Feeling of Stress: Not at all  Social Connections: Moderately Integrated (06/27/2024)   Social Connection and Isolation Panel    Frequency of Communication with Friends and Family: More than three times a week    Frequency of Social Gatherings with Friends and Family: More than three times a week    Attends Religious Services: More than 4 times per year    Active Member of Clubs or Organizations: No    Attends Banker Meetings: Never    Marital Status: Married  Catering Manager Violence: Not At Risk (06/27/2024)   Epic    Fear of Current or Ex-Partner: No    Emotionally Abused: No    Physically Abused: No    Sexually Abused: No  Depression (PHQ2-9): Low Risk (07/10/2024)   Depression (PHQ2-9)    PHQ-2 Score: 0  Alcohol Screen: Low Risk (07/12/2021)   Alcohol Screen    Last Alcohol Screening Score (AUDIT): 0  Housing: Low Risk (06/27/2024)   Epic    Unable to Pay for Housing in the Last Year: No    Number of Times Moved in the Last Year: 0    Homeless in the Last Year: No  Utilities: Not At Risk (06/27/2024)   Epic    Threatened with loss of utilities: No  Health Literacy: Adequate Health Literacy (06/27/2024)   B1300 Health Literacy    Frequency of need for help with medical instructions: Never    Family History  Problem Relation Age of Onset   Heart disease Father    Dementia Sister    Diabetes Son    Kidney disease Neg Hx    Prostate cancer Neg Hx     Current Medications[2]  Physical exam:  Vitals:   07/10/24 0926  BP: (!) 97/58  Pulse: 80  Resp: 16  Temp: 97.8 F (36.6 C)  TempSrc: Tympanic  SpO2: 99%  Weight: 158 lb (71.7 kg)   Physical  Exam Constitutional:      Comments: Sitting in a wheelchair.  Appears in no acute distress  Eyes:     Pupils: Pupils are equal, round, and reactive to light.  Cardiovascular:     Rate and Rhythm: Normal rate and regular rhythm.  Heart sounds: Normal heart sounds.  Pulmonary:     Effort: Pulmonary effort is normal.     Breath sounds: Normal breath sounds.  Abdominal:     General: Bowel sounds are normal.     Palpations: Abdomen is soft.  Skin:    General: Skin is warm and dry.  Neurological:     Mental Status: He is alert and oriented to person, place, and time.      I have personally reviewed labs listed below:    Latest Ref Rng & Units 07/10/2024    8:53 AM  CMP  Glucose 70 - 99 mg/dL 876   BUN 8 - 23 mg/dL 34   Creatinine 9.38 - 1.24 mg/dL 7.08   Sodium 864 - 854 mmol/L 143   Potassium 3.5 - 5.1 mmol/L 4.8   Chloride 98 - 111 mmol/L 104   CO2 22 - 32 mmol/L 23   Calcium  8.9 - 10.3 mg/dL 8.8   Total Protein 6.5 - 8.1 g/dL 6.3   Total Bilirubin 0.0 - 1.2 mg/dL 0.7   Alkaline Phos 38 - 126 U/L 141   AST 15 - 41 U/L 31   ALT 0 - 44 U/L 26       Latest Ref Rng & Units 07/10/2024    8:53 AM  CBC  WBC 4.0 - 10.5 K/uL 9.3   Hemoglobin 13.0 - 17.0 g/dL 6.1   Hematocrit 60.9 - 52.0 % 19.9   Platelets 150 - 400 K/uL 296    I have personally reviewed Radiology images listed below: No images are attached to the encounter.  VAS US  UPPER EXT VEIN MAPPING (PRE-OP  AVF) Result Date: 06/27/2024 UPPER EXTREMITY VEIN MAPPING Patient Name:  SR. [name]  Date of Exam:   06/26/2024 Medical Rec #: [id number]          Accession #:    [id number] Date of Birth: 01/30/1941          Patient Gender: M Patient Age:   54 years Exam Location:  [facility] Procedure:      VAS US  UPPER EXT VEIN MAPPING (PRE-OP  AVF) Referring Phys: ORVIN DARING --------------------------------------------------------------------------------  Indications: Evaluation prior to placement of dialysis  access. Performing Technologist: Donnice Charnley RVT  Examination Guidelines: A complete evaluation includes B-mode imaging, spectral Doppler, color Doppler, and power Doppler as needed of all accessible portions of each vessel. Bilateral testing is considered an integral part of a complete examination. Limited examinations for reoccurring indications may be performed as noted. +-----------------+-------------+----------+-----------------------------+ Right Cephalic   Diameter (cm)Depth (cm)          Findings            +-----------------+-------------+----------+-----------------------------+ Shoulder             0.16                                             +-----------------+-------------+----------+-----------------------------+ Prox upper arm       0.15                                             +-----------------+-------------+----------+-----------------------------+ Mid upper arm        0.14  not visualized and Thrombosed +-----------------+-------------+----------+-----------------------------+ Dist upper arm                                   Thrombosed           +-----------------+-------------+----------+-----------------------------+ Antecubital fossa                                Thrombosed           +-----------------+-------------+----------+-----------------------------+ +-----------------+-------------+----------+--------+ Right Basilic    Diameter (cm)Depth (cm)Findings +-----------------+-------------+----------+--------+ Prox upper arm       0.58                        +-----------------+-------------+----------+--------+ Mid upper arm        0.31                        +-----------------+-------------+----------+--------+ Dist upper arm       0.28                        +-----------------+-------------+----------+--------+ Antecubital fossa    0.33                        +-----------------+-------------+----------+--------+  Prox forearm         0.16                        +-----------------+-------------+----------+--------+ +--------------+-------------+---------+--------------------------------------+ Left Cephalic Diameter (cm)  Depth                 Findings                                             (cm)                                          +--------------+-------------+---------+--------------------------------------+ Shoulder          0.10                                                     +--------------+-------------+---------+--------------------------------------+ Prox upper arm    0.07                                                     +--------------+-------------+---------+--------------------------------------+ Mid upper arm     0.09                                                     +--------------+-------------+---------+--------------------------------------+ Dist upper arm  Subdivides into multiple small                                                        branches                +--------------+-------------+---------+--------------------------------------+ +-----------------+-------------+----------+--------+ Left Basilic     Diameter (cm)Depth (cm)Findings +-----------------+-------------+----------+--------+ Prox upper arm       0.52                        +-----------------+-------------+----------+--------+ Mid upper arm        0.37                        +-----------------+-------------+----------+--------+ Dist upper arm       0.37                        +-----------------+-------------+----------+--------+ Antecubital fossa    0.38                        +-----------------+-------------+----------+--------+ Prox forearm         0.29                        +-----------------+-------------+----------+--------+ Summary: Right: 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 Basilic vein was identified with        diameters as described above. Left: Left cephalic vein is small and subdivides into multiple       smaller branches in the distal upper arm. Basilic vein was       identified with diameters as described above. *See table(s) above for measurements and observations.  Diagnosing physician: Cordella Shawl MD Electronically signed by Cordella Shawl MD on 06/27/2024 at 9:25:55 AM.    Final    VAS US  UPPER EXTREMITY ARTERIAL DUPLEX Result Date: 06/27/2024  UPPER EXTREMITY DUPLEX STUDY Patient Name:  SR. [name]  Date of Exam:   06/26/2024 Medical Rec #: [id number]          Accession #:    [id number] Date of Birth: 09-03-1940          Patient Gender: M Patient Age:   33 years Exam [facility] Procedure:      VAS US  UPPER EXTREMITY ARTERIAL DUPLEX Referring Phys: ORVIN DARING --------------------------------------------------------------------------------  Indications: Evaluation prior to placement of dialysis access.  Risk Factors: Hypertension, hyperlipidemia, prior MI, coronary artery disease. Performing Technologist: Donnice Charnley RVT  Examination Guidelines: A complete evaluation includes B-mode imaging, spectral Doppler, color Doppler, and power Doppler as needed of all accessible portions of each vessel. Bilateral testing is considered an integral part of a complete examination. Limited examinations for reoccurring indications may be performed as noted.  Right Doppler Findings: +-------------+----------+--------+--------+--------+ Site         PSV (cm/s)WaveformStenosisComments +-------------+----------+--------+--------+--------+ Brachial Dist80                                 +-------------+----------+--------+--------+--------+ Radial Dist  91                                 +-------------+----------+--------+--------+--------+  Ulnar Dist   85                                  +-------------+----------+--------+--------+--------+   Right Pre-Dialysis Findings: +-----------------------+----------+--------------------+---------+--------+ Location               PSV (cm/s)Intralum. Diam. (cm)Waveform Comments +-----------------------+----------+--------------------+---------+--------+ Brachial Antecub. fossa80        0.68                biphasic          +-----------------------+----------+--------------------+---------+--------+ Radial Art at Wrist    91        0.38                triphasic         +-----------------------+----------+--------------------+---------+--------+ Ulnar Art at Wrist     85        0.23                triphasic         +-----------------------+----------+--------------------+---------+--------+  Left Doppler Findings: +-------------+----------+--------+--------+--------+ Site         PSV (cm/s)WaveformStenosisComments +-------------+----------+--------+--------+--------+ Brachial Dist57                                 +-------------+----------+--------+--------+--------+ Radial Dist  117                                +-------------+----------+--------+--------+--------+ Ulnar Dist   91                                 +-------------+----------+--------+--------+--------+   Left Pre-Dialysis Findings: +-----------------------+----------+--------------------+---------+--------+ Location               PSV (cm/s)Intralum. Diam. (cm)Waveform Comments +-----------------------+----------+--------------------+---------+--------+ Brachial Antecub. fossa57        0.66                triphasic         +-----------------------+----------+--------------------+---------+--------+ Radial Art at Wrist    117       0.24                triphasic         +-----------------------+----------+--------------------+---------+--------+ Ulnar Art at Wrist     91        0.31                triphasic          +-----------------------+----------+--------------------+---------+--------+  Summary:  Right: No obstruction visualized in the right upper extremity. Left: No obstruction visualized in the left upper extremity. *See table(s) above for measurements and observations. Electronically signed by Cordella Shawl MD on 06/27/2024 at 9:25:48 AM.    Final    IR BONE MARROW BIOPSY & ASPIRATION Result Date: 06/18/2024 CLINICAL DATA:  Myelodysplastic syndrome evaluation. History of myelofibrosis. EXAM: Fluoroscopic guided bone marrow biopsy TECHNIQUE: Fluoroscopy CONTRAST:  None RADIOPHARMACEUTICALS:  None FLUOROSCOPY: 20 mGy COMPARISON:  None FINDINGS: The patient was placed in prone position on the IR table. Radiopaque markers were placed on the patient's skin and initial imaging of the pelvis was performed. The patient's skin was then prepped and draped in the usual sterile fashion. Moderate sedation was provided for by the nursing staff under my supervision utilizing intravenous Versed  and fentanyl .  The nurse had no other duties other than monitoring the patient and providing sedation during the procedure. I was present for the entire procedure. 1 mg intravenous Versed  and 25 mcg intravenous fentanyl  were administered for a total sedation time of 9 minutes. 1% lidocaine  was used to infiltrate the skin at the access site prior to a stab incision. Local anesthesia was then used to infiltrate the region of soft tissue from the skin to the left iliac bone. The bone marrow needle was then advanced and imaging demonstrated the needle tip to be in the cortex of the left iliac bone. The bone was then penetrated and aspiration was performed, however no aspirate obtained. The needle was withdrawn and a subsequent attempt was made for aspirate which was also unsuccessful. A core sample was then obtained. Multiple attempts at sampling was performed in order to get 2 1 cm segments. All needles were then removed from the patient.  Sterile dressing was applied. IMPRESSION: Satisfactory core needle biopsy of the left iliac bone marrow under fluoroscopic guidance. No aspirate obtained despite multiple attempts. Electronically Signed   By: Cordella Banner   On: 06/18/2024 10:01     Assessment and plan- Patient is a 84 y.o. 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  He had a bone marrow biopsy which showed myelofibrosis with peripheral blood NGS showing SF 3 B1 mutation and patient was started on luspatercept  in June 2025.  At that time his EPO was stopped.  Patient is presently on maximum doses of luspatercept  1.75 mg/kg but hemoglobin fluctuates between 6-7 and he has remained transfusion dependent intermittently.  He has also developed ESRD and is on hemodialysis and EPO was restarted in December 2025.  Despite receiving EPO for the last 2 months along with luspatercept  there has been no significant improvement in his anemia.  Patient therefore had a repeat bone marrow biopsy on 06/17/2024 which showed hypercellular bone marrow 50%.  Accurate assessment of morphology and differential limited by absence of bone marrow aspirate and scant cellularity.  Markedly fibrotic hypercellular marrow.  No increase in blasts.  Reticulin and trichrome stains are not yet back.  Peripheral blood NGS testing is pending.  At this point my concern is that his anemia is mainly driven by his myelofibrosis.  His white cell count and platelets are relatively preserved.  Danazol would be relatively contraindicated in patients who are on dialysis.  JAK2 inhibitors like momelotinib are an option but they have not been studied in patients with dialysis but remains one of his only meaningful options to see if his anemia  would respond.  I would like the patient to get a second opinion for his myelofibrosis associated anemia and see if we can start him on a low-dose of momelotinib 100 mg daily.  We will start working on insurance approval for the same.  Discussed risks and benefits of momelotinib including all but not limited to hypertension leg edema skin rash low blood counts abnormal LFTs and risk of infections.  I will hold off on starting the drug until he gets seen for second opinion at [facility].  Patient verbalized understanding of the plan  He will receive his next dose of luspatercept  today.  Hemoglobin is 6.1 today and he will receive 2  units of PRBC transfusion this week. Cbc and hold tube for possible transfusion in 10 days. See me in 6 weeks with cbc with diff and cmp   Visit Diagnosis 1. Myelodysplasia (myelodysplastic syndrome) (HCC)   2. Encounter for antineoplastic chemotherapy   3. Symptomatic anemia      Dr. Annah Skene, MD, MPH [facility] [id number] 07/10/2024 12:52 PM                   [1]  Allergies Allergen Reactions   Quinolones Other (See Comments)    Patient with aortic aneurysm. Use of quinolones are considered to be contraindicated due to risk of aortic rupture.   Meloxicam Nausea And Vomiting  [2]  Current Outpatient Medications:    acetaminophen  (TYLENOL ) 500 MG tablet, Take 1,000 mg by mouth every 6 (six) hours as needed for mild pain (pain score 1-3)., Disp: , Rfl:    amiodarone  (PACERONE ) 200 MG tablet, Take 1 tablet (200 mg total) by mouth daily., Disp: 30 tablet, Rfl: 0   amLODipine  (NORVASC ) 10 MG tablet, Take 1 tablet (10 mg total) by mouth daily., Disp: 30 tablet, Rfl: 0   cyanocobalamin  1000 MCG tablet, Take 1 tablet (1,000 mcg total) by mouth daily., Disp: 30 tablet, Rfl: 0   DULoxetine  (CYMBALTA ) 20 MG capsule, Take 1 capsule (20 mg total) by mouth at bedtime., Disp: 90 capsule, Rfl: 1   finasteride  (PROSCAR ) 5 MG tablet, Take 5  mg by mouth daily., Disp: , Rfl:    levothyroxine  (SYNTHROID ) 150 MCG tablet, Take 1 tablet (150 mcg total) by mouth daily before breakfast., Disp: 90 tablet, Rfl: 0   metoprolol  succinate (TOPROL -XL) 25 MG 24 hr tablet, Take 1 tablet (25 mg total) by mouth 2 (two) times daily., Disp: 180 tablet, Rfl: 1   Multiple Vitamin (MULITIVITAMIN WITH MINERALS) TABS, Take 1 tablet by mouth in the morning., Disp: , Rfl:    ondansetron  (ZOFRAN ) 4 MG tablet, Take 1 tablet (4 mg total) by mouth every 6 (six) hours as needed for nausea., Disp: 20 tablet, Rfl: 0   pantoprazole  (PROTONIX ) 40 MG tablet, Take 1 tablet (40 mg total) by mouth daily., Disp: 90 tablet, Rfl: 1   simvastatin  (ZOCOR ) 40 MG tablet, Take 1 tablet (40 mg total) by mouth daily., Disp: 90 tablet, Rfl: 1   sodium bicarbonate  650 MG tablet, Take 1 tablet (650 mg total) by mouth 3 (three) times daily., Disp: , Rfl:    tamsulosin  (FLOMAX ) 0.4 MG CAPS capsule, Take 1 capsule (0.4 mg total) by mouth daily., Disp: 90 capsule, Rfl: 1   traMADol  (ULTRAM ) 50 MG tablet, Take 1 tablet (50 mg total) by mouth every 12 (twelve) hours as needed for moderate pain (pain score 4-6)., Disp: 30 tablet, Rfl: 0   triamcinolone  ointment (KENALOG ) 0.5 %, Apply 1 Application topically 2 (two) times daily., Disp: 30 g, Rfl: 0   lactulose  (CHRONULAC ) 10 GM/15ML solution, Take 30 mLs (20 g total) by mouth 2 (two) times daily as needed for mild constipation. (Patient not taking: Reported on 07/10/2024), Disp: 236 mL, Rfl: 0 No current facility-administered medications for this visit.  Facility-Administered Medications Ordered in Other Visits:    epoetin  alfa-epbx (RETACRIT ) injection 10,000 Units, 10,000 Units, Subcutaneous, Once, Skene Annah BROCKS, MD   epoetin  alfa-epbx (RETACRIT ) injection 40,000 Units, 40,000 Units, Subcutaneous, Once, Skene Annah BROCKS, MD  "

## 2024-07-10 NOTE — Telephone Encounter (Signed)
 Per Dr. Melanee 1. Marrow report reads reticulin and trichrome stains will be reported in an addendum and this was not done. Can you please reach out to pathology and have them do an addendum on the about. Outbound call; spoke with Nat who touched base with Dr. Frutoso.  Dr. Frutoso said it was ordered but is going to follow up on it now. Nat in patho will call me back with an update.

## 2024-07-10 NOTE — Telephone Encounter (Signed)
 Oral Oncology Patient Advocate Encounter  Patient's wife called me back to let me know that the patient has been enrolled into the [facility] Prescription Payment Plan.  MPPP added into [facility].  Ellar Hakala (Patty) Chet Burnet, CPhT  [facility], Zelda Salmon, [facility] - Oral Chemotherapy Pharmacy Technician III Certified Patient Advocate Phone: [phone]  Fax: [phone]

## 2024-07-11 ENCOUNTER — Inpatient Hospital Stay

## 2024-07-11 ENCOUNTER — Telehealth: Payer: Self-pay

## 2024-07-11 ENCOUNTER — Other Ambulatory Visit (INDEPENDENT_AMBULATORY_CARE_PROVIDER_SITE_OTHER): Payer: Self-pay | Admitting: Nurse Practitioner

## 2024-07-11 ENCOUNTER — Inpatient Hospital Stay: Admitting: Nurse Practitioner

## 2024-07-11 DIAGNOSIS — D649 Anemia, unspecified: Secondary | ICD-10-CM

## 2024-07-11 DIAGNOSIS — N186 End stage renal disease: Secondary | ICD-10-CM

## 2024-07-11 MED ORDER — DIPHENHYDRAMINE HCL 25 MG PO TABS
25.0000 mg | ORAL_TABLET | Freq: Once | ORAL | Status: AC
  Administered 2024-07-11: 25 mg via ORAL
  Filled 2024-07-11: qty 1

## 2024-07-11 MED ORDER — ACETAMINOPHEN 325 MG PO TABS
650.0000 mg | ORAL_TABLET | Freq: Once | ORAL | Status: AC
  Administered 2024-07-11: 650 mg via ORAL
  Filled 2024-07-11: qty 2

## 2024-07-11 MED ORDER — SODIUM CHLORIDE 0.9 % IV SOLN
INTRAVENOUS | Status: DC
  Filled 2024-07-11 (×2): qty 250

## 2024-07-11 NOTE — Telephone Encounter (Signed)
 Voicemail received today 07/11/24 at 12:10pm from Allyson at [facility] indicating that she has received the paperwork faxed yesterday as well as the paperwork faxed over today.  She has everything she needs and she will forward to the triage team and after review will contact patient to get them scheduled.

## 2024-07-11 NOTE — Telephone Encounter (Signed)
 Referral to [facility] to see Dr. Fleeta Ebbing faxed yesterday to [facility] for second opinion MDS / primary myelofibrosis; fax confirmation received.  Received chat indicating [facility] did not receive paperwork along with referral.  Outbound call to [facility], transferred to [place] and voice message was left.  Allison's phone [phone] and on voicemail indicated fax # [phone].  Faxed the following documents (same fax number original documents were sent to) and received fax confirmation: 1) molecular pathology report, cytogenetics report, and surgical path

## 2024-07-11 NOTE — Progress Notes (Signed)
 Patient here for 2 units pRBC today. Bp continued to rise each time it was checked. At the end of the 2nd unit, BP was very elevated. Patient complained of dizziness and nausea. Dr. Melanee was notified. Morna Husband, NP came to chairside and assessed patient. Patient kept for additional 30 minutes. Symptoms improved. Patient discharged in stable condition.

## 2024-07-12 ENCOUNTER — Inpatient Hospital Stay: Admission: RE | Admit: 2024-07-12

## 2024-07-12 ENCOUNTER — Other Ambulatory Visit: Payer: Self-pay

## 2024-07-12 LAB — BPAM RBC
Blood Product Expiration Date: 202603082359
Blood Product Unit Number: 202603082359
ISSUE DATE / TIME: 202602051212
PRODUCT CODE: 202602051000
PRODUCT CODE: 202603082359
Unit Type and Rh: 202603082359
Unit Type and Rh: 5100
Unit Type and Rh: 5100
Unit Type and Rh: 5100

## 2024-07-12 LAB — TYPE AND SCREEN
ABO/RH(D): O POS
Antibody Screen: NEGATIVE
Unit division: 0
Unit division: 0

## 2024-07-12 NOTE — Telephone Encounter (Signed)
 Addendum has resulted.

## 2024-07-12 NOTE — Patient Instructions (Addendum)
 Your procedure is scheduled on:   THURSDAY  FEBRUARY 19  Report to the Registration Desk on the 1st floor of the [facility] To find out your arrival time, please call [phone] between 1PM - 3PM on:   [facility]  FEBRUARY 18  If your arrival time is 6:00 am, do not arrive before that time as the [facility] entrance doors do not open until 6:00 am.  REMEMBER: Instructions that are not followed completely may result in serious medical risk, up to and including death; or upon the discretion of your surgeon and anesthesiologist your surgery may need to be rescheduled.  Do not eat food after midnight the night before surgery.  No gum chewing or hard candies.   One week prior to surgery:STARTING  THURSDAY FEBRUARY 12  Stop Anti-inflammatories (NSAIDS) such as Advil, Aleve, Ibuprofen, Motrin, Naproxen, Naprosyn and Aspirin based products such as Excedrin, Goody's Powder, BC Powder. Stop ANY OVER THE COUNTER supplements until after surgery. cyanocobalamin  (B12)  Multiple Vitamin   You may however, continue to take Tylenol  if needed for pain up until the day of surgery.  Continue taking all of your other prescription medications up until the day of surgery.  ON THE DAY OF SURGERY ONLY TAKE THESE MEDICATIONS WITH SIPS OF WATER :  amiodarone  (PACERONE )  DULoxetine  (CYMBALTA )  levothyroxine  (SYNTHROID )  metoprolol  succinate (TOPROL -XL)   No Alcohol for 24 hours before or after surgery.  Do not use any recreational drugs for at least a week (preferably 2 weeks) before your surgery.  Please be advised that the combination of cocaine and anesthesia may have negative outcomes, up to and including death. If you test positive for cocaine, your surgery will be cancelled.  On the morning of surgery brush your teeth with toothpaste and water , you may rinse your mouth with mouthwash if you wish. Do not swallow any toothpaste or mouthwash.  Use CHG Soap as directed on instruction  sheet.  Do not wear jewelry, make-up, hairpins, clips or nail polish.  For welded (permanent) jewelry: bracelets, anklets, waist bands, etc.  Please have this removed prior to surgery.  If it is not removed, there is a chance that hospital personnel will need to cut it off on the day of surgery.  Do not wear lotions, powders, or perfumes.   Do not shave body hair from the neck down 48 hours before surgery.  Contact lenses, hearing aids and dentures may not be worn into surgery.  Do not bring valuables to the hospital. [facility] is not responsible for any missing/lost belongings or valuables.   Notify your doctor if there is any change in your medical condition (cold, fever, infection).  Wear comfortable clothing (specific to your surgery type) to the hospital.  After surgery, you can help prevent lung complications by doing breathing exercises.  Take deep breaths and cough every 1-2 hours.   If you are being discharged the day of surgery, you will not be allowed to drive home. You will need a responsible individual to drive you home and stay with you for 24 hours after surgery.   If you are taking public transportation, you will need to have a responsible individual with you.  Please call the Pre-admissions Testing Dept. at [phone] if you have any questions about these instructions.  Surgery Visitation Policy:  Patients having surgery or a procedure may have two visitors.  Children under the age of 77 must have an adult with them who is not the patient.  Merchandiser, Retail to address health-related social needs:  [email or web address]                                                                                                                Preparing for Surgery with CHLORHEXIDINE  GLUCONATE (CHG) Soap  Chlorhexidine  Gluconate (CHG) Soap  o An antiseptic cleaner that kills germs and bonds with the skin to continue killing germs even after  washing  o Used for showering the night before surgery and morning of surgery  Before surgery, you can play an important role by reducing the number of germs on your skin.  CHG (Chlorhexidine  gluconate) soap is an antiseptic cleanser which kills germs and bonds with the skin to continue killing germs even after washing.  Please do not use if you have an allergy to CHG or antibacterial soaps. If your skin becomes reddened/irritated stop using the CHG.  1. Shower the NIGHT BEFORE SURGERY with CHG soap.  2. If you choose to wash your hair, wash your hair first as usual with your normal shampoo.  3. After shampooing, rinse your hair and body thoroughly to remove the shampoo.  4. Use CHG as you would any other liquid soap. You can apply CHG directly to the skin and wash gently with a clean washcloth.  5. Apply the CHG soap to your body only from the neck down. Do not use on open wounds or open sores. Avoid contact with your eyes, ears, mouth, and genitals (private parts). Wash face and genitals (private parts) with your normal soap.  6. Wash thoroughly, paying special attention to the area where your surgery will be performed.  7. Thoroughly rinse your body with warm water .  8. Do not shower/wash with your normal soap after using and rinsing off the CHG soap.  9. Do not use lotions, oils, etc., after showering with CHG.  10. Pat yourself dry with a clean towel.  11. Wear clean pajamas to bed the night before surgery.  12. Place clean sheets on your bed the night of your shower and do not sleep with pets.  13. Do not apply any deodorants/lotions/powders.  14. Please wear clean clothes to the hospital.  15. Remember to brush your teeth with your regular toothpaste.

## 2024-07-18 ENCOUNTER — Ambulatory Visit

## 2024-07-19 ENCOUNTER — Inpatient Hospital Stay

## 2024-07-22 ENCOUNTER — Ambulatory Visit: Admitting: Family Medicine

## 2024-07-22 ENCOUNTER — Inpatient Hospital Stay

## 2024-07-25 ENCOUNTER — Ambulatory Visit: Admit: 2024-07-25 | Admitting: Vascular Surgery

## 2024-07-25 ENCOUNTER — Encounter: Payer: Self-pay | Admitting: Urgent Care

## 2024-07-31 ENCOUNTER — Inpatient Hospital Stay: Admitting: Oncology

## 2024-07-31 ENCOUNTER — Inpatient Hospital Stay

## 2024-08-21 ENCOUNTER — Inpatient Hospital Stay

## 2024-08-21 ENCOUNTER — Inpatient Hospital Stay: Admitting: Oncology

## 2024-08-22 ENCOUNTER — Inpatient Hospital Stay

## 2024-08-23 ENCOUNTER — Inpatient Hospital Stay: Admitting: Oncology

## 2024-08-23 ENCOUNTER — Inpatient Hospital Stay

## 2025-04-25 ENCOUNTER — Encounter (INDEPENDENT_AMBULATORY_CARE_PROVIDER_SITE_OTHER): Admitting: Ophthalmology

## 2025-07-03 ENCOUNTER — Ambulatory Visit
